# Patient Record
Sex: Male | Born: 1940 | Race: White | Hispanic: No | Smoking: Former smoker
Health system: Southern US, Community
[De-identification: ages and names within clinical notes are randomized; demographics above are authoritative.]

## PROBLEM LIST (undated history)

## (undated) DIAGNOSIS — Z9989 Dependence on other enabling machines and devices: Secondary | ICD-10-CM

## (undated) DIAGNOSIS — R5381 Other malaise: Secondary | ICD-10-CM

## (undated) DIAGNOSIS — J9610 Chronic respiratory failure, unspecified whether with hypoxia or hypercapnia: Secondary | ICD-10-CM

## (undated) DIAGNOSIS — F419 Anxiety disorder, unspecified: Secondary | ICD-10-CM

## (undated) DIAGNOSIS — H9190 Unspecified hearing loss, unspecified ear: Secondary | ICD-10-CM

## (undated) DIAGNOSIS — I429 Cardiomyopathy, unspecified: Secondary | ICD-10-CM

## (undated) DIAGNOSIS — R259 Unspecified abnormal involuntary movements: Secondary | ICD-10-CM

## (undated) DIAGNOSIS — K5520 Angiodysplasia of colon without hemorrhage: Secondary | ICD-10-CM

## (undated) DIAGNOSIS — E7800 Pure hypercholesterolemia, unspecified: Secondary | ICD-10-CM

## (undated) DIAGNOSIS — J8410 Pulmonary fibrosis, unspecified: Secondary | ICD-10-CM

## (undated) DIAGNOSIS — J449 Chronic obstructive pulmonary disease, unspecified: Secondary | ICD-10-CM

## (undated) DIAGNOSIS — F4310 Post-traumatic stress disorder, unspecified: Secondary | ICD-10-CM

## (undated) DIAGNOSIS — I2510 Atherosclerotic heart disease of native coronary artery without angina pectoris: Secondary | ICD-10-CM

## (undated) DIAGNOSIS — H15003 Unspecified scleritis, bilateral: Secondary | ICD-10-CM

## (undated) DIAGNOSIS — R5383 Other fatigue: Secondary | ICD-10-CM

## (undated) DIAGNOSIS — G4733 Obstructive sleep apnea (adult) (pediatric): Secondary | ICD-10-CM

## (undated) DIAGNOSIS — E039 Hypothyroidism, unspecified: Secondary | ICD-10-CM

## (undated) DIAGNOSIS — IMO0002 Reserved for concepts with insufficient information to code with codable children: Secondary | ICD-10-CM

## (undated) DIAGNOSIS — D696 Thrombocytopenia, unspecified: Secondary | ICD-10-CM

## (undated) DIAGNOSIS — N400 Enlarged prostate without lower urinary tract symptoms: Secondary | ICD-10-CM

## (undated) DIAGNOSIS — E119 Type 2 diabetes mellitus without complications: Secondary | ICD-10-CM

## (undated) DIAGNOSIS — Z9981 Dependence on supplemental oxygen: Secondary | ICD-10-CM

## (undated) DIAGNOSIS — F329 Major depressive disorder, single episode, unspecified: Secondary | ICD-10-CM

## (undated) DIAGNOSIS — I255 Ischemic cardiomyopathy: Secondary | ICD-10-CM

## (undated) DIAGNOSIS — R042 Hemoptysis: Secondary | ICD-10-CM

## (undated) DIAGNOSIS — R269 Unspecified abnormalities of gait and mobility: Secondary | ICD-10-CM

## (undated) DIAGNOSIS — I10 Essential (primary) hypertension: Secondary | ICD-10-CM

## (undated) DIAGNOSIS — J189 Pneumonia, unspecified organism: Secondary | ICD-10-CM

## (undated) DIAGNOSIS — I719 Aortic aneurysm of unspecified site, without rupture: Secondary | ICD-10-CM

## (undated) DIAGNOSIS — M069 Rheumatoid arthritis, unspecified: Secondary | ICD-10-CM

## (undated) DIAGNOSIS — M1990 Unspecified osteoarthritis, unspecified site: Secondary | ICD-10-CM

## (undated) DIAGNOSIS — I5022 Chronic systolic (congestive) heart failure: Secondary | ICD-10-CM

## (undated) HISTORY — DX: Unspecified scleritis, bilateral: H15.003

## (undated) HISTORY — PX: TONSILLECTOMY: SUR1361

## (undated) HISTORY — DX: Unspecified abnormalities of gait and mobility: R26.9

## (undated) HISTORY — DX: Chronic respiratory failure, unspecified whether with hypoxia or hypercapnia: J96.10

## (undated) HISTORY — PX: CATARACT EXTRACTION W/ INTRAOCULAR LENS  IMPLANT, BILATERAL: SHX1307

## (undated) HISTORY — DX: Essential (primary) hypertension: I10

## (undated) HISTORY — DX: Unspecified hearing loss, unspecified ear: H91.90

## (undated) HISTORY — DX: Major depressive disorder, single episode, unspecified: F32.9

## (undated) HISTORY — DX: Atherosclerotic heart disease of native coronary artery without angina pectoris: I25.10

## (undated) HISTORY — DX: Angiodysplasia of colon without hemorrhage: K55.20

## (undated) HISTORY — DX: Unspecified osteoarthritis, unspecified site: M19.90

## (undated) HISTORY — DX: Pulmonary fibrosis, unspecified: J84.10

## (undated) HISTORY — DX: Thrombocytopenia, unspecified: D69.6

## (undated) HISTORY — DX: Hypothyroidism, unspecified: E03.9

## (undated) HISTORY — DX: Other malaise: R53.81

## (undated) HISTORY — DX: Chronic obstructive pulmonary disease, unspecified: J44.9

## (undated) HISTORY — PX: GANGLION CYST EXCISION: SHX1691

## (undated) HISTORY — PX: CARDIAC CATHETERIZATION: SHX172

## (undated) HISTORY — DX: Other fatigue: R53.83

## (undated) HISTORY — PX: BACK SURGERY: SHX140

## (undated) HISTORY — DX: Cardiomyopathy, unspecified: I42.9

## (undated) HISTORY — DX: Post-traumatic stress disorder, unspecified: F43.10

## (undated) HISTORY — PX: COLECTOMY: SHX59

## (undated) HISTORY — DX: Unspecified abnormal involuntary movements: R25.9

## (undated) HISTORY — DX: Hemoptysis: R04.2

## (undated) HISTORY — PX: EYE SURGERY: SHX253

## (undated) HISTORY — DX: Rheumatoid arthritis, unspecified: M06.9

## (undated) HISTORY — DX: Reserved for concepts with insufficient information to code with codable children: IMO0002

## (undated) HISTORY — DX: Benign prostatic hyperplasia without lower urinary tract symptoms: N40.0

---

## 1970-07-28 HISTORY — PX: LUMBAR LAMINECTOMY: SHX95

## 2000-07-28 HISTORY — PX: FEMORAL EMBOLOECTOMY: SHX1584

## 2000-08-17 ENCOUNTER — Encounter: Payer: Self-pay | Admitting: Emergency Medicine

## 2000-08-17 ENCOUNTER — Inpatient Hospital Stay (HOSPITAL_COMMUNITY): Admission: EM | Admit: 2000-08-17 | Discharge: 2000-08-27 | Payer: Self-pay | Admitting: Emergency Medicine

## 2000-08-18 ENCOUNTER — Encounter: Payer: Self-pay | Admitting: Internal Medicine

## 2000-08-19 ENCOUNTER — Encounter: Payer: Self-pay | Admitting: Internal Medicine

## 2000-08-20 ENCOUNTER — Encounter: Payer: Self-pay | Admitting: Pulmonary Disease

## 2000-08-21 ENCOUNTER — Encounter: Payer: Self-pay | Admitting: Internal Medicine

## 2000-08-25 ENCOUNTER — Encounter: Payer: Self-pay | Admitting: Internal Medicine

## 2000-09-23 ENCOUNTER — Ambulatory Visit (HOSPITAL_COMMUNITY): Admission: RE | Admit: 2000-09-23 | Discharge: 2000-09-23 | Payer: Self-pay | Admitting: *Deleted

## 2000-09-25 ENCOUNTER — Ambulatory Visit (HOSPITAL_COMMUNITY): Admission: RE | Admit: 2000-09-25 | Discharge: 2000-09-25 | Payer: Self-pay | Admitting: *Deleted

## 2001-12-24 ENCOUNTER — Encounter: Payer: Self-pay | Admitting: Internal Medicine

## 2001-12-24 ENCOUNTER — Encounter: Admission: RE | Admit: 2001-12-24 | Discharge: 2001-12-24 | Payer: Self-pay | Admitting: Internal Medicine

## 2001-12-30 ENCOUNTER — Ambulatory Visit (HOSPITAL_COMMUNITY): Admission: RE | Admit: 2001-12-30 | Discharge: 2001-12-30 | Payer: Self-pay | Admitting: *Deleted

## 2002-06-21 ENCOUNTER — Encounter: Payer: Self-pay | Admitting: Internal Medicine

## 2002-06-21 ENCOUNTER — Encounter: Admission: RE | Admit: 2002-06-21 | Discharge: 2002-06-21 | Payer: Self-pay | Admitting: Internal Medicine

## 2002-07-11 ENCOUNTER — Encounter: Payer: Self-pay | Admitting: Vascular Surgery

## 2002-07-13 ENCOUNTER — Ambulatory Visit (HOSPITAL_COMMUNITY): Admission: RE | Admit: 2002-07-13 | Discharge: 2002-07-13 | Payer: Self-pay | Admitting: Vascular Surgery

## 2002-07-28 HISTORY — PX: ABDOMINAL AORTIC ANEURYSM REPAIR: SUR1152

## 2002-08-04 ENCOUNTER — Encounter: Payer: Self-pay | Admitting: Vascular Surgery

## 2002-08-04 ENCOUNTER — Inpatient Hospital Stay (HOSPITAL_COMMUNITY): Admission: RE | Admit: 2002-08-04 | Discharge: 2002-08-10 | Payer: Self-pay | Admitting: Vascular Surgery

## 2002-08-04 ENCOUNTER — Encounter (INDEPENDENT_AMBULATORY_CARE_PROVIDER_SITE_OTHER): Payer: Self-pay | Admitting: *Deleted

## 2002-08-05 ENCOUNTER — Encounter: Payer: Self-pay | Admitting: Vascular Surgery

## 2002-09-04 ENCOUNTER — Emergency Department (HOSPITAL_COMMUNITY): Admission: EM | Admit: 2002-09-04 | Discharge: 2002-09-04 | Payer: Self-pay | Admitting: Emergency Medicine

## 2004-01-06 ENCOUNTER — Emergency Department (HOSPITAL_COMMUNITY): Admission: EM | Admit: 2004-01-06 | Discharge: 2004-01-06 | Payer: Self-pay | Admitting: Emergency Medicine

## 2004-01-17 ENCOUNTER — Ambulatory Visit (HOSPITAL_COMMUNITY): Admission: RE | Admit: 2004-01-17 | Discharge: 2004-01-17 | Payer: Self-pay | Admitting: Internal Medicine

## 2004-02-26 HISTORY — PX: ABDOMINAL EXPLORATION SURGERY: SHX538

## 2004-03-18 ENCOUNTER — Encounter: Payer: Self-pay | Admitting: Emergency Medicine

## 2004-03-18 ENCOUNTER — Inpatient Hospital Stay (HOSPITAL_COMMUNITY): Admission: EM | Admit: 2004-03-18 | Discharge: 2004-03-29 | Payer: Self-pay | Admitting: Emergency Medicine

## 2007-11-24 ENCOUNTER — Encounter: Admission: RE | Admit: 2007-11-24 | Discharge: 2007-11-24 | Payer: Self-pay | Admitting: Internal Medicine

## 2007-12-06 ENCOUNTER — Encounter: Admission: RE | Admit: 2007-12-06 | Discharge: 2008-01-06 | Payer: Self-pay | Admitting: Internal Medicine

## 2009-02-10 ENCOUNTER — Observation Stay (HOSPITAL_COMMUNITY): Admission: EM | Admit: 2009-02-10 | Discharge: 2009-02-12 | Payer: Self-pay | Admitting: Emergency Medicine

## 2009-05-31 ENCOUNTER — Encounter: Admission: RE | Admit: 2009-05-31 | Discharge: 2009-05-31 | Payer: Self-pay | Admitting: Internal Medicine

## 2009-07-24 ENCOUNTER — Ambulatory Visit: Payer: Self-pay | Admitting: Internal Medicine

## 2009-07-24 ENCOUNTER — Inpatient Hospital Stay (HOSPITAL_COMMUNITY): Admission: EM | Admit: 2009-07-24 | Discharge: 2009-07-28 | Payer: Self-pay | Admitting: Emergency Medicine

## 2009-07-25 ENCOUNTER — Encounter: Payer: Self-pay | Admitting: Internal Medicine

## 2009-07-30 DIAGNOSIS — I10 Essential (primary) hypertension: Secondary | ICD-10-CM | POA: Insufficient documentation

## 2009-07-30 DIAGNOSIS — E039 Hypothyroidism, unspecified: Secondary | ICD-10-CM | POA: Insufficient documentation

## 2009-07-30 DIAGNOSIS — E7800 Pure hypercholesterolemia, unspecified: Secondary | ICD-10-CM | POA: Insufficient documentation

## 2009-07-30 HISTORY — DX: Hypothyroidism, unspecified: E03.9

## 2009-07-30 HISTORY — DX: Essential (primary) hypertension: I10

## 2009-07-31 ENCOUNTER — Ambulatory Visit: Payer: Self-pay | Admitting: Internal Medicine

## 2009-07-31 DIAGNOSIS — J9610 Chronic respiratory failure, unspecified whether with hypoxia or hypercapnia: Secondary | ICD-10-CM

## 2009-07-31 HISTORY — DX: Chronic respiratory failure, unspecified whether with hypoxia or hypercapnia: J96.10

## 2009-08-15 ENCOUNTER — Ambulatory Visit: Payer: Self-pay | Admitting: Internal Medicine

## 2009-09-05 ENCOUNTER — Telehealth (INDEPENDENT_AMBULATORY_CARE_PROVIDER_SITE_OTHER): Payer: Self-pay | Admitting: *Deleted

## 2009-09-11 ENCOUNTER — Telehealth: Payer: Self-pay | Admitting: Internal Medicine

## 2009-09-14 ENCOUNTER — Encounter: Payer: Self-pay | Admitting: Internal Medicine

## 2009-09-26 ENCOUNTER — Ambulatory Visit: Payer: Self-pay | Admitting: Internal Medicine

## 2009-09-28 ENCOUNTER — Telehealth: Payer: Self-pay | Admitting: Internal Medicine

## 2009-11-06 ENCOUNTER — Ambulatory Visit: Payer: Self-pay | Admitting: Internal Medicine

## 2009-11-06 DIAGNOSIS — B370 Candidal stomatitis: Secondary | ICD-10-CM

## 2009-11-23 ENCOUNTER — Ambulatory Visit: Payer: Self-pay | Admitting: Internal Medicine

## 2009-11-23 DIAGNOSIS — F329 Major depressive disorder, single episode, unspecified: Secondary | ICD-10-CM

## 2009-11-23 DIAGNOSIS — I2510 Atherosclerotic heart disease of native coronary artery without angina pectoris: Secondary | ICD-10-CM

## 2009-11-23 DIAGNOSIS — M1990 Unspecified osteoarthritis, unspecified site: Secondary | ICD-10-CM

## 2009-11-23 DIAGNOSIS — R5381 Other malaise: Secondary | ICD-10-CM

## 2009-11-23 DIAGNOSIS — F3289 Other specified depressive episodes: Secondary | ICD-10-CM

## 2009-11-23 DIAGNOSIS — N400 Enlarged prostate without lower urinary tract symptoms: Secondary | ICD-10-CM

## 2009-11-23 DIAGNOSIS — R259 Unspecified abnormal involuntary movements: Secondary | ICD-10-CM

## 2009-11-23 HISTORY — DX: Unspecified abnormal involuntary movements: R25.9

## 2009-11-23 HISTORY — DX: Other malaise: R53.81

## 2009-11-23 HISTORY — DX: Atherosclerotic heart disease of native coronary artery without angina pectoris: I25.10

## 2009-11-23 HISTORY — DX: Unspecified osteoarthritis, unspecified site: M19.90

## 2009-11-23 HISTORY — DX: Other specified depressive episodes: F32.89

## 2009-11-23 HISTORY — DX: Benign prostatic hyperplasia without lower urinary tract symptoms: N40.0

## 2009-11-23 HISTORY — DX: Major depressive disorder, single episode, unspecified: F32.9

## 2009-11-26 LAB — CONVERTED CEMR LAB: Vit D, 25-Hydroxy: 29 ng/mL — ABNORMAL LOW (ref 30–89)

## 2009-11-27 LAB — CONVERTED CEMR LAB
ALT: 27 units/L (ref 0–53)
AST: 31 units/L (ref 0–37)
Albumin: 4.2 g/dL (ref 3.5–5.2)
Alkaline Phosphatase: 52 units/L (ref 39–117)
BUN: 18 mg/dL (ref 6–23)
Basophils Absolute: 0 10*3/uL (ref 0.0–0.1)
Basophils Relative: 0.4 % (ref 0.0–3.0)
Bilirubin Urine: NEGATIVE
Bilirubin, Direct: 0.1 mg/dL (ref 0.0–0.3)
CO2: 34 meq/L — ABNORMAL HIGH (ref 19–32)
Calcium: 9.1 mg/dL (ref 8.4–10.5)
Chloride: 99 meq/L (ref 96–112)
Cholesterol: 172 mg/dL (ref 0–200)
Creatinine, Ser: 1 mg/dL (ref 0.4–1.5)
Eosinophils Absolute: 0.3 10*3/uL (ref 0.0–0.7)
Eosinophils Relative: 2.7 % (ref 0.0–5.0)
Folate: 13.1 ng/mL
GFR calc non Af Amer: 78.92 mL/min (ref >60)
Glucose, Bld: 122 mg/dL — ABNORMAL HIGH (ref 70–99)
HCT: 43.7 % (ref 39.0–52.0)
HDL: 34.2 mg/dL — ABNORMAL LOW (ref >39.00)
Hemoglobin, Urine: NEGATIVE
Hemoglobin: 14.3 g/dL (ref 13.0–17.0)
Hgb A1c MFr Bld: 8.4 % — ABNORMAL HIGH (ref 4.6–6.5)
Iron: 29 ug/dL — ABNORMAL LOW (ref 42–165)
Ketones, ur: NEGATIVE mg/dL
LDL Cholesterol: 107 mg/dL — ABNORMAL HIGH (ref 0–99)
Leukocytes, UA: NEGATIVE
Lymphocytes Relative: 24.2 % (ref 12.0–46.0)
Lymphs Abs: 2.3 10*3/uL (ref 0.7–4.0)
MCHC: 32.6 g/dL (ref 30.0–36.0)
MCV: 82.6 fL (ref 78.0–100.0)
Monocytes Absolute: 0.9 10*3/uL (ref 0.1–1.0)
Monocytes Relative: 9.9 % (ref 3.0–12.0)
Neutro Abs: 6 10*3/uL (ref 1.4–7.7)
Neutrophils Relative %: 62.8 % (ref 43.0–77.0)
Nitrite: NEGATIVE
PSA: 0.67 ng/mL (ref 0.10–4.00)
Platelets: 223 10*3/uL (ref 150.0–400.0)
Potassium: 3.9 meq/L (ref 3.5–5.1)
RBC: 5.29 M/uL (ref 4.22–5.81)
RDW: 15.1 % — ABNORMAL HIGH (ref 11.5–14.6)
Saturation Ratios: 5.2 % — ABNORMAL LOW (ref 20.0–50.0)
Sed Rate: 10 mm/hr (ref 0–22)
Sodium: 141 meq/L (ref 135–145)
Specific Gravity, Urine: 1.015 (ref 1.000–1.030)
TSH: 1.63 microintl units/mL (ref 0.35–5.50)
Total Bilirubin: 0.7 mg/dL (ref 0.3–1.2)
Total CHOL/HDL Ratio: 5
Total Protein: 7 g/dL (ref 6.0–8.3)
Transferrin: 400.2 mg/dL — ABNORMAL HIGH (ref 212.0–360.0)
Triglycerides: 154 mg/dL — ABNORMAL HIGH (ref 0.0–149.0)
Urine Glucose: NEGATIVE mg/dL
Urobilinogen, UA: 1 (ref 0.0–1.0)
VLDL: 30.8 mg/dL (ref 0.0–40.0)
Vitamin B-12: 333 pg/mL (ref 211–911)
WBC: 9.5 10*3/uL (ref 4.5–10.5)
pH: 7.5 (ref 5.0–8.0)

## 2009-12-08 ENCOUNTER — Ambulatory Visit: Payer: Self-pay | Admitting: Internal Medicine

## 2009-12-10 ENCOUNTER — Ambulatory Visit: Payer: Self-pay | Admitting: Internal Medicine

## 2009-12-10 DIAGNOSIS — R269 Unspecified abnormalities of gait and mobility: Secondary | ICD-10-CM | POA: Insufficient documentation

## 2009-12-10 HISTORY — DX: Unspecified abnormalities of gait and mobility: R26.9

## 2009-12-11 ENCOUNTER — Ambulatory Visit: Payer: Self-pay | Admitting: Internal Medicine

## 2009-12-17 ENCOUNTER — Emergency Department (HOSPITAL_COMMUNITY): Admission: EM | Admit: 2009-12-17 | Discharge: 2009-12-17 | Payer: Self-pay | Admitting: Emergency Medicine

## 2009-12-17 ENCOUNTER — Telehealth: Payer: Self-pay | Admitting: Internal Medicine

## 2010-01-23 ENCOUNTER — Encounter: Payer: Self-pay | Admitting: Internal Medicine

## 2010-01-30 ENCOUNTER — Encounter: Admission: RE | Admit: 2010-01-30 | Discharge: 2010-01-30 | Payer: Self-pay | Admitting: Neurology

## 2010-02-07 ENCOUNTER — Ambulatory Visit: Payer: Self-pay | Admitting: Internal Medicine

## 2010-02-21 ENCOUNTER — Ambulatory Visit: Payer: Self-pay | Admitting: Internal Medicine

## 2010-03-01 ENCOUNTER — Telehealth: Payer: Self-pay | Admitting: Internal Medicine

## 2010-03-01 ENCOUNTER — Ambulatory Visit: Payer: Self-pay | Admitting: Internal Medicine

## 2010-03-01 DIAGNOSIS — H9190 Unspecified hearing loss, unspecified ear: Secondary | ICD-10-CM

## 2010-03-01 HISTORY — DX: Unspecified hearing loss, unspecified ear: H91.90

## 2010-03-18 ENCOUNTER — Telehealth (INDEPENDENT_AMBULATORY_CARE_PROVIDER_SITE_OTHER): Payer: Self-pay | Admitting: *Deleted

## 2010-03-20 ENCOUNTER — Encounter: Payer: Self-pay | Admitting: Internal Medicine

## 2010-03-26 ENCOUNTER — Ambulatory Visit: Payer: Self-pay | Admitting: Internal Medicine

## 2010-04-03 ENCOUNTER — Telehealth (INDEPENDENT_AMBULATORY_CARE_PROVIDER_SITE_OTHER): Payer: Self-pay | Admitting: *Deleted

## 2010-04-04 ENCOUNTER — Telehealth (INDEPENDENT_AMBULATORY_CARE_PROVIDER_SITE_OTHER): Payer: Self-pay | Admitting: *Deleted

## 2010-04-08 ENCOUNTER — Encounter: Payer: Self-pay | Admitting: Internal Medicine

## 2010-05-07 ENCOUNTER — Ambulatory Visit: Payer: Self-pay | Admitting: Internal Medicine

## 2010-05-07 ENCOUNTER — Encounter: Payer: Self-pay | Admitting: Adult Health

## 2010-05-07 DIAGNOSIS — R042 Hemoptysis: Secondary | ICD-10-CM

## 2010-05-07 HISTORY — DX: Hemoptysis: R04.2

## 2010-05-07 LAB — CONVERTED CEMR LAB
BUN: 19 mg/dL (ref 6–23)
Basophils Absolute: 0 10*3/uL (ref 0.0–0.1)
Basophils Relative: 0.4 % (ref 0.0–3.0)
CO2: 33 meq/L — ABNORMAL HIGH (ref 19–32)
Calcium: 10.1 mg/dL (ref 8.4–10.5)
Chloride: 98 meq/L (ref 96–112)
Creatinine, Ser: 1.3 mg/dL (ref 0.4–1.5)
Eosinophils Absolute: 0.2 10*3/uL (ref 0.0–0.7)
Eosinophils Relative: 1.8 % (ref 0.0–5.0)
GFR calc non Af Amer: 59.82 mL/min (ref >60)
Glucose, Bld: 134 mg/dL — ABNORMAL HIGH (ref 70–99)
HCT: 42.5 % (ref 39.0–52.0)
Hemoglobin: 13.7 g/dL (ref 13.0–17.0)
Lymphocytes Relative: 20.4 % (ref 12.0–46.0)
Lymphs Abs: 2.1 10*3/uL (ref 0.7–4.0)
MCHC: 32.2 g/dL (ref 30.0–36.0)
MCV: 80.4 fL (ref 78.0–100.0)
Monocytes Absolute: 1.1 10*3/uL — ABNORMAL HIGH (ref 0.1–1.0)
Monocytes Relative: 10.5 % (ref 3.0–12.0)
Neutro Abs: 6.8 10*3/uL (ref 1.4–7.7)
Neutrophils Relative %: 66.9 % (ref 43.0–77.0)
Platelets: 182 10*3/uL (ref 150.0–400.0)
Potassium: 5.1 meq/L (ref 3.5–5.1)
RBC: 5.28 M/uL (ref 4.22–5.81)
RDW: 17.2 % — ABNORMAL HIGH (ref 11.5–14.6)
Sodium: 142 meq/L (ref 135–145)
WBC: 10.2 10*3/uL (ref 4.5–10.5)

## 2010-05-08 ENCOUNTER — Ambulatory Visit: Payer: Self-pay | Admitting: Cardiology

## 2010-05-13 ENCOUNTER — Ambulatory Visit: Payer: Self-pay | Admitting: Internal Medicine

## 2010-05-14 ENCOUNTER — Telehealth (INDEPENDENT_AMBULATORY_CARE_PROVIDER_SITE_OTHER): Payer: Self-pay | Admitting: *Deleted

## 2010-05-15 ENCOUNTER — Telehealth: Payer: Self-pay | Admitting: Adult Health

## 2010-06-04 ENCOUNTER — Encounter: Payer: Self-pay | Admitting: Adult Health

## 2010-06-04 ENCOUNTER — Ambulatory Visit: Payer: Self-pay | Admitting: Internal Medicine

## 2010-06-05 ENCOUNTER — Ambulatory Visit: Payer: Self-pay | Admitting: Internal Medicine

## 2010-06-05 DIAGNOSIS — IMO0002 Reserved for concepts with insufficient information to code with codable children: Secondary | ICD-10-CM

## 2010-06-05 HISTORY — DX: Reserved for concepts with insufficient information to code with codable children: IMO0002

## 2010-06-11 ENCOUNTER — Ambulatory Visit (HOSPITAL_COMMUNITY): Admission: RE | Admit: 2010-06-11 | Discharge: 2010-06-11 | Payer: Self-pay | Admitting: Internal Medicine

## 2010-06-18 ENCOUNTER — Ambulatory Visit: Payer: Self-pay | Admitting: Internal Medicine

## 2010-06-18 DIAGNOSIS — J8410 Pulmonary fibrosis, unspecified: Secondary | ICD-10-CM | POA: Insufficient documentation

## 2010-06-18 HISTORY — DX: Pulmonary fibrosis, unspecified: J84.10

## 2010-06-18 LAB — CONVERTED CEMR LAB
Anti Nuclear Antibody(ANA): NEGATIVE
Rhuematoid fact SerPl-aCnc: 352 intl units/mL — ABNORMAL HIGH (ref 0–20)
Sed Rate: 10 mm/hr (ref 0–22)

## 2010-06-19 ENCOUNTER — Telehealth: Payer: Self-pay | Admitting: Internal Medicine

## 2010-07-02 ENCOUNTER — Telehealth: Payer: Self-pay | Admitting: Internal Medicine

## 2010-07-03 ENCOUNTER — Encounter: Payer: Self-pay | Admitting: Internal Medicine

## 2010-07-03 ENCOUNTER — Ambulatory Visit: Payer: Self-pay | Admitting: Internal Medicine

## 2010-07-03 LAB — CONVERTED CEMR LAB: CRP, High Sensitivity: 10.85 — ABNORMAL HIGH (ref 0.00–5.00)

## 2010-07-12 ENCOUNTER — Telehealth (INDEPENDENT_AMBULATORY_CARE_PROVIDER_SITE_OTHER): Payer: Self-pay | Admitting: *Deleted

## 2010-07-12 LAB — CONVERTED CEMR LAB
Cyclic Citrullin Peptide Ab: <2 units (ref 0.0–5.0)
Scleroderma (Scl-70) (ENA) Antibody, IgG: <1 (ref <30)

## 2010-07-15 ENCOUNTER — Encounter (HOSPITAL_COMMUNITY)
Admission: RE | Admit: 2010-07-15 | Discharge: 2010-08-27 | Payer: Self-pay | Source: Home / Self Care | Attending: Internal Medicine | Admitting: Internal Medicine

## 2010-07-15 ENCOUNTER — Inpatient Hospital Stay (HOSPITAL_COMMUNITY)
Admission: EM | Admit: 2010-07-15 | Discharge: 2010-07-18 | Payer: Self-pay | Source: Home / Self Care | Attending: Internal Medicine | Admitting: Internal Medicine

## 2010-08-12 ENCOUNTER — Ambulatory Visit
Admission: RE | Admit: 2010-08-12 | Discharge: 2010-08-12 | Payer: Self-pay | Source: Home / Self Care | Attending: Internal Medicine | Admitting: Internal Medicine

## 2010-08-12 ENCOUNTER — Encounter: Payer: Self-pay | Admitting: Internal Medicine

## 2010-08-21 ENCOUNTER — Encounter: Payer: Self-pay | Admitting: Internal Medicine

## 2010-08-27 ENCOUNTER — Ambulatory Visit
Admission: RE | Admit: 2010-08-27 | Discharge: 2010-08-27 | Payer: Self-pay | Source: Home / Self Care | Attending: Internal Medicine | Admitting: Internal Medicine

## 2010-08-27 ENCOUNTER — Telehealth (INDEPENDENT_AMBULATORY_CARE_PROVIDER_SITE_OTHER): Payer: Self-pay | Admitting: *Deleted

## 2010-08-29 ENCOUNTER — Encounter (HOSPITAL_COMMUNITY): Payer: Medicare Other

## 2010-08-29 NOTE — Letter (Signed)
Summary: LMN for Oxygen/[facility]  LMN for Oxygen/[facility]   Imported By: Sherian Rein 04/11/2010 08:07:54  _____________________________________________________________________  External Attachment:    Type:   Image     Comment:   External Document

## 2010-08-29 NOTE — Consult Note (Signed)
Summary: COPD exacerbation/[facility]  COPD exacerbation/[facility]   Imported By: Sherian Rein 08/07/2009 09:39:10  _____________________________________________________________________  External Attachment:    Type:   Image     Comment:   External Document

## 2010-08-29 NOTE — Progress Notes (Signed)
Summary: returning call  Phone Note Call from Patient Call back at Home Phone [phone]   Caller: Patient Call For: [email or web address] Summary of Call: Returning Jennifer's call. Initial call taken by: Darletta Moll,  July 12, 2010 9:00 AM  Follow-up for Phone Call        [id number] to advise pt of labs per append to 07-03-10 labs. Carron Curie CMA  July 12, 2010 9:35 AM  Pt returned call.  He was informed of lab results and recs per append  on 07/09/10 by MR.  He verbalized understanding of this.  He is aware MR would like to see him back in Feb for f/u and PFTs but also needs recertification on oxygen. He is requesting all this be done same day and before Feb.  PFT scheduled for 08/12/10 at 1pm and f/u afterwards with MR at 2:10.  Pt aware and will bring card from DME stating o2 recert needed.   Follow-up by: Gweneth Dimitri RN,  July 12, 2010 12:32 PM

## 2010-08-29 NOTE — Progress Notes (Signed)
Summary: O2 sats report - refaxed to [facility]  Phone Note Call from Patient Call back at Home Phone [phone]   Caller: Patient Call For: WERT Summary of Call: pt states that [facility] has not yet received a report re: recent O2 sats. pt also has a question re: using eclipse at night.  Initial call taken by: [name], CNA,  April 03, 2010 11:22 AM  Follow-up for Phone Call        ATC x1 line busy, will retry later to address pts other questions. I have refaxed qualifying sats to [facility]. Carron Curie CMA  April 03, 2010 2:38 PM  called spoke with patient, informed him that the re-qualifying sats have been refaxed to [facility].  pt verbalized his understanding.  pt asked if the eclipse portable o2 concentrator can be used for trips and overnight use - will be traveling to [place] in the near future.  i informed patient that we are not familiar with all of the different uses for each particular machine and that he will have to call [facility] for that answer.  pt verbalized his understanding. Follow-up by: [name] CNA/MA,  April 03, 2010 3:50 PM

## 2010-08-29 NOTE — Miscellaneous (Signed)
Summary: Orders Update pft charges  Clinical Lists Changes  Orders: Added new Service order of Carbon Monoxide diffusing w/capacity (94720) - Signed Added new Service order of Lung Volumes (94240) - Signed Added new Service order of Spirometry (Pre & Post) (94060) - Signed 

## 2010-08-29 NOTE — Assessment & Plan Note (Signed)
Summary: Pulmonary/ f/u ov   Primary Provider/Referring Provider:  Dr. Eula Listen  CC:  3 month followup.  Pt states that his breathing is "pretty good".  .  History of Present Illness: 32  yowm quit smoking around 2000 with GOLD III COPD by PFT's 09/2009  July 31, 2009 cc sob at rest since June 2010 while of ace and high doses of propranonol seen in consutation 07/25/2009 rec no ace, d/c propranolo > reports  no better from hospitalization and on  02 just at night with dry cough but no cp.  Thoroughly confused with instructions, still on ACE though I had strongly rec he stop these.  August 15, 2009--Returns for follow up and med review. Last visit, Ace was stopped (again). He returns today feeling much better. Only using O2 intermittently, "afraid he will get dependent on this", We discussed how O2 works and problems with desaturations. He desats in the upper 70 with activity and at rest he averages 87-90% at rest. He is on mulitple meds. Gets most of meds via [place] system. He does have insurance as well.  September 26, 2009 Followup with PFT's.  Pt states breathing is the same which represents a marked improvement over baseline with no new complaints today. on 4lpm can walk flat fine as long as he paces himself with no sign variability. rec symbicort     February 07, 2010 cc  breathing is "pretty good".   can even push a mower now for 30 min wihout sotpping using 02 4lpm with anything but very slow adls vs a year ago sob at rest.  sleeps ok without resp problems or am exac. rarely needs saba (maybe twice a week) no cough. Pt denies any significant sore throat, dysphagia, itching, sneezing,  nasal congestion or excess secretions,  fever, chills, sweats, unintended wt loss, pleuritic or exertional cp, hempoptysis, change in activity tolerance  orthopnea pnd or leg swelling   Current Medications (verified): 1)  Spiriva Handihaler 18 Mcg Caps (Tiotropium Bromide Monohydrate) .... Inhale Contents of 1 Cap  Daily 2)  Centrum Silver  Tabs (Multiple Vitamins-Minerals) .[name].. 1 Once Daily 3)  Gemfibrozil 600 Mg Tabs (Gemfibrozil) .[name].. 1 Tab By Mouth  Every Morning and At Bedtime 4)  Crestor 20 Mg Tabs (Rosuvastatin Calcium) .... 1/2 At Bedtime 5)  Levothyroxine Sodium 100 Mcg Tabs (Levothyroxine Sodium) .... One Daily 6)  Levothyroxine Sodium 112 Mcg Tabs (Levothyroxine Sodium) .[name].. 1 By Mouth Daily 7)  Gabapentin 800 Mg Tabs (Gabapentin) .[name].. 1 Three Times A Day 8)  Losartan Potassium 100 Mg Tabs (Losartan Potassium) .... 1/2 Once Daily 9)  Requip 1 Mg Tabs (Ropinirole Hcl) .... Take 1 Tab By Mouth At Bedtime 10)  Fluoxetine Hcl 20 Mg Tabs (Fluoxetine Hcl) .[name].. 1 Once Daily 11)  Finasteride 5 Mg Tabs (Finasteride) .... Take 1 Tab By Mouth At Bedtime 12)  Flomax 0.4 Mg Caps (Tamsulosin Hcl) .... Take 1 Tab By Mouth At Bedtime 13)  Glipizide 10 Mg Tabs (Glipizide) .[name].. 1 By Mouth Two Times A Day 14)  Metformin Hcl 1000 Mg Tabs (Metformin Hcl) .... One Twice Daily 15)  Aspirin 81 Mg Tbec (Aspirin) .[name].. 1 By Mouth Daily 16)  Lantus 100 Unit/ml Soln (Insulin Glargine) .... 75 Units Subcutaneously Once Daily 17)  Proventil Hfa 108 (90 Base) Mcg/act Aers (Albuterol Sulfate) .... 2 Puffs Every 4 Hours As Needed 18)  Mucinex Dm 30-600 Mg Xr12h-Tab (Dextromethorphan-Guaifenesin) .... Take 1-2 Tablets Every 12 Hours As Needed 19)  Symbicort 160-4.5 Mcg/act  Aero (Budesonide-Formoterol Fumarate) .... 2 Puffs First Thing  in Am and 2 Puffs Again in Pm About 12 Hours Later 20)  Clotrimazole 10 Mg Troc (Clotrimazole) .... One 4 X Daily As Needed For Throat Irritation 21)  Home O2  - 2l At Rest, 4l With Exertion 22)  Hydrochlorothiazide 25 Mg Tabs (Hydrochlorothiazide) .... 1/2 Once Daily 23)  Vitamin D3 2000 Unit Caps (Cholecalciferol) .[name].. 1 Once Daily 24)  Zolpidem Tartrate 10 Mg Tabs (Zolpidem Tartrate) .[name].. 1 At Bedtime As Needed  Allergies (verified): No Known Drug Allergies  Past History:  Past Medical  History: HYPOTHYROIDISM (ICD-244.9)    - TSH  .276 12/209/10 HYPERTENSION (ICD-401.9) C O P D (ICD-496)...........................................................[name] Dr Sherene Sires      - HFA 50% July 31, 2009 >   90% September 26, 2009 > 90% February 07, 2010       - PFT's September 26, 2009 FEV1 1.27 (38%) ratio 36 with 30% improvement  after B2, DLC0 35 corrects to 66 Right Pleural Calcified plaques s/p remote pna     - CT Chest 05/31/2010 Diabetes mellitus, type II Benign prostatic hypertrophy DJD Depression Coronary artery disease - non-obstructive by cath juen 2003 - Dr Myriam Jacobson [place]/card tremor  Vital Signs:  Patient profile:   70 year old male Weight:      249.38 pounds O2 Sat:      94 % on 2 L/min Temp:     97.5 degrees F oral Pulse rate:   85 / minute BP sitting:   120 / 78  (left arm)  Vitals Entered By: Vernie Murders (February 07, 2010 9:33 AM)  O2 Flow:  2 L/min  Physical Exam  Additional Exam:  obese amb minimally  hoarse wm nad on 02 with minimal lip tremor wt  254  September 26, 2009 > 257 November 06, 2009 > 249 February 07, 2010  HEENT mild turbinate edema.  partial top denture, full bottom dentureOropharynx pos thrush or excess pnd or cobblestoning.  No JVD or cervical adenopathy. Mild accessory muscle hypertrophy. Trachea midline, nl thryroid. Chest was hyperinflated by percussion with diminished breath sounds and moderate increased exp time without wheeze. Hoover sign positive at mid inspiration. Regular rate and rhythm without murmur gallop or rub or increase P2 or edema.  Abd: no hsm, nl excursion. Ext warm without cyanosis or clubbing.     Impression & Recommendations:  Problem # 1:  C O P D (ICD-496) GOLD III with sign asthmatic component well compensated off ace and propranonol.   Each maintenance medication was reviewed in detail including most importantly the difference between maintenance and as needed and under what circumstances the prns are to be used. This was done in the  context of a medication calendar review which provided the patient with a user-friendly unambiguous mechanism for medication administration and reconciliation and provides an action plan for all active problems. It is critical that this be shown to every doctor  for modification during the office visit if necessary so the patient can use it as a working document.   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 Patient Level III (16109)  Medications Added to Medication List This Visit: 1)  Losartan Potassium 100 Mg Tabs (Losartan potassium) .... 1/2 once daily 2)  Fluoxetine Hcl 20  Mg Tabs (Fluoxetine hcl) .[name].. 1 once daily 3)  Hydrochlorothiazide 25 Mg Tabs (Hydrochlorothiazide) .... 1/2 once daily 4)  Vitamin D3 2000 Unit Caps (Cholecalciferol) .[name].. 1 once daily 5)  Zolpidem Tartrate 10 Mg Tabs (Zolpidem tartrate) .[name].. 1 at bedtime as needed  Patient Instructions: 1)  Return to office in 3 months, sooner if needed  2)  See calendar for specific medication instructions and bring it back for each and every office visit for every healthcare provider you see.  Without it,  you may not receive the best quality medical care that we feel you deserve

## 2010-08-29 NOTE — Assessment & Plan Note (Signed)
Summary: 3 MO ROV /NWS  # r leg pain/cd   Vital Signs:  Patient profile:   70 year old male Height:      74.5 inches Weight:      248.50 pounds BMI:     31.59 O2 Sat:      94 % on Room air Temp:     98 degrees F oral Pulse rate:   86 / minute BP sitting:   124 / 62  (left arm) Cuff size:   large  Vitals Entered By: Zella Ball Ewing CMA (AAMA) (June 05, 2010 1:46 PM)  O2 Flow:  Room air  CC: 3 month ROV, back pain/RE   Primary Care Dwanda Tufano:  Dr. Eula Listen  CC:  3 month ROV and back pain/RE.  History of Present Illness: here to f/u; has seen psychiatry for depression at the [place];  as well as endo with several meds stopped including the glipizide and prozac.  c/o pain to the right lower back and buttock,  had a fall 4 wks ago where the buttock started and persistetnly pained, but now with 3 days new onset radiation of pain to just below the right knee, with numbness and weakness, walking with cane today for balance and support (does not normally do this);  no bowel or bladder change;  no falls or gait change, fever or wt loss  [facility]; given 6 pills of vicodin 5/325 mg and advised to come here as able; pain medicine did not work - only lasted one hr, up and down at night  pt seen for med review yesterday, but  med list not updated yesterday to reflect his actual current meds, some from the [place], some discontinued as above  Pt denies CP, wheezing, orthopnea, pnd, worsening LE edema, palps, dizziness or syncope  Pt denies polydipsia, polyuria, or low sugar symptoms such as shakiness improved with eating.  Overall good compliance with meds, trying to follow low chol, DM diet, wt stable, little excercise however  No fever, wt loss, night sweats, loss of appetite or other constitutional symptoms   Problems Prior to Update: 1)  Lumbar Radiculopathy, Right  (ICD-724.4) 2)  Hemoptysis Unspecified  (ICD-786.30) 3)  Decreased Hearing, Left Ear  (ICD-389.9) 4)   Chronic Obstructive Pulmonary Disease, Acute Exacerbation  (ICD-491.21) 5)  Gait Disturbance  (ICD-781.2) 6)  Dyspnea/shortness of Breath  (ICD-786.09) 7)  Preventive Health Care  (ICD-V70.0) 8)  Tremor  (ICD-781.0) 9)  Special Screening Malig Neoplasms Other Sites  (ICD-V76.49) 10)  Fatigue  (ICD-780.79) 11)  Coronary Artery Disease  (ICD-414.00) 12)  Depression  (ICD-311) 13)  Degenerative Joint Disease  (ICD-715.90) 14)  Benign Prostatic Hypertrophy  (ICD-600.00) 15)  Diabetes Mellitus, Type II  (ICD-250.00) 16)  Thrush  (ICD-112.0) 17)  Respiratory Failure, Chronic  (ICD-518.83) 18)  Hypothyroidism  (ICD-244.9) 19)  Hypertension  (ICD-401.9) 20)  C O P D  (ICD-496)  Medications Prior to Update: 1)  Spiriva Handihaler 18 Mcg Caps (Tiotropium Bromide Monohydrate) .... Inhale Contents of 1 Cap Daily 2)  Centrum Silver  Tabs (Multiple Vitamins-Minerals) .[name].. 1 Once Daily 3)  Crestor 20 Mg Tabs (Rosuvastatin Calcium) .... 1/2 At Bedtime 4)  Gabapentin 800 Mg Tabs (Gabapentin) .[name].. 1 Three Times A Day 5)  Losartan Potassium 100 Mg Tabs (Losartan Potassium) .... 1/2 Once Daily 6)  Requip 1 Mg Tabs (Ropinirole Hcl) .... Take 1 Tab By Mouth At Bedtime 7)  Fluoxetine Hcl 20 Mg Tabs (Fluoxetine Hcl) .Marland KitchenMarland KitchenMarland Kitchen  1 Tablet Two Times A Day 8)  Flomax 0.4 Mg Caps (Tamsulosin Hcl) .... Take 1 Tab By Mouth At Bedtime 9)  Glipizide 10 Mg Tabs (Glipizide) .[name].. 1 By Mouth Two Times A Day 10)  Metformin Hcl 1000 Mg Tabs (Metformin Hcl) .... One Twice Daily 11)  Lantus 100 Unit/ml Soln (Insulin Glargine) .... 74 Units Subcutaneously Once Daily 12)  Proventil Hfa 108 (90 Base) Mcg/act Aers (Albuterol Sulfate) .... 2 Puffs Every 4 Hours As Needed 13)  Mucinex Dm 30-600 Mg Xr12h-Tab (Dextromethorphan-Guaifenesin) .... Take 1-2 Tablets Every 12 Hours As Needed 14)  Symbicort 160-4.5 Mcg/act  Aero (Budesonide-Formoterol Fumarate) .... 2 Puffs First Thing  in Am and 2 Puffs Again in Pm About 12 Hours Later 15)   Clotrimazole 10 Mg Troc (Clotrimazole) .... One 4 X Daily As Needed For Throat Irritation 16)  O2  - 2l At Rest, 4l With Exertion 17)  Hydrochlorothiazide 25 Mg Tabs (Hydrochlorothiazide) .... 1/2 Once Daily As Needed 18)  Vitamin D3 2000 Unit Caps (Cholecalciferol) .[name].. 1 Once Daily 19)  Zolpidem Tartrate 10 Mg Tabs (Zolpidem Tartrate) .[name].. 1 At Bedtime As Needed 20)  Niacin Cr 500 Mg Cr-Caps (Niacin) .[name].. 1 Tablet Once Daily 21)  Levothyroxine Sodium 200 Mcg Tabs (Levothyroxine Sodium) .[name].. 1 By Mouth Once Daily  Current Medications (verified): 1)  Spiriva Handihaler 18 Mcg Caps (Tiotropium Bromide Monohydrate) .... Inhale Contents of 1 Cap Daily 2)  Centrum Silver  Tabs (Multiple Vitamins-Minerals) .[name].. 1 Once Daily 3)  Simvastatin 20 Mg Tabs (Simvastatin) .... 1/4 By Mouth Once Daily 4)  Gabapentin 800 Mg Tabs (Gabapentin) .[name].. 1po Two Times A Day 5)  Losartan Potassium 100 Mg Tabs (Losartan Potassium) .... 1/2 Once Daily 6)  Requip 1 Mg Tabs (Ropinirole Hcl) .... Take 1 Tab By Mouth At Bedtime 7)  Flomax 0.4 Mg Caps (Tamsulosin Hcl) .... Take 1 Tab By Mouth At Bedtime 8)  Metformin Hcl 1000 Mg Tabs (Metformin Hcl) .... One Twice Daily 9)  Lantus 100 Unit/ml Soln (Insulin Glargine) .... 48 Units or More Subcutaneously Once Daily Plus Sliding Scale 10)  Proventil Hfa 108 (90 Base) Mcg/act Aers (Albuterol Sulfate) .... 2 Puffs Every 4 Hours As Needed 11)  Mucinex Dm 30-600 Mg Xr12h-Tab (Dextromethorphan-Guaifenesin) .... Take 1-2 Tablets Every 12 Hours As Needed 12)  Clotrimazole 10 Mg Troc (Clotrimazole) .... One 4 X Daily As Needed For Throat Irritation 13)  O2  - 2l At Rest, 4l With Exertion 14)  Hydrochlorothiazide 25 Mg Tabs (Hydrochlorothiazide) .... 1/2 Once Daily As Needed 15)  Vitamin D3 2000 Unit Caps (Cholecalciferol) .[name].. 1 Once Daily 16)  Niacin Cr 500 Mg Cr-Caps (Niacin) .[name].. 1 Tablet Once Daily 17)  Levothyroxine Sodium 200 Mcg Tabs (Levothyroxine Sodium) .[name].. 1 By Mouth  Once Daily 18)  [facility] Inhaler - ? Name 19)  Sleep Med Per [facility] 20)  Prednisone 10 Mg Tabs (Prednisone) .... 3po Qd For 3days, Then 2po Qd For 3days, Then 1po Qd For 3days, Then Stop 21)  Oxycodone Hcl 5 Mg Caps (Oxycodone Hcl) .[name].. 1 - 2 By Mouth Q 6 Hrs As Needed  Allergies (verified): No Known Drug Allergies  Past History:  Past Medical History: Last updated: 05/13/2010 HYPOTHYROIDISM (ICD-244.9)    - TSH  .276 12/209/10 HYPERTENSION (ICD-401.9) C O P D (ICD-496)...........................................................[name] Dr Sherene Sires      - HFA 50% July 31, 2009 >   90% September 26, 2009 > 90% February 07, 2010       -  PFT's September 26, 2009 FEV1 1.27 (38%) ratio 36 with 30% improvement  after B2, DLC0 35 corrects to 66 Right Pleural Calcified plaques s/p remote pna     - CT Chest 05/31/2010 > no change 05/08/10 Diabetes mellitus, type II Benign prostatic hypertrophy DJD Depression Coronary artery disease - non-obstructive by cath juen 2003 - Dr Myriam Jacobson [place]/card tremor gait disturbance - Dr Terrace Arabia - ? parkinsons  Past Surgical History: Last updated: 11/23/2009 Small Bowel Obstruction Repair with adhesiolysis hx of remote ileum resection due to bleeding Abdominal Aneurysm Repair s/p back surgury 1972, lumbar laminectomy - Dr Fannie Knee s/p left femoral embolectomy with left leg ischemia - Dr Sena Slate s/p right ganglion cyst - Dr Teressa Senter  Social History: Last updated: 11/23/2009 Patient states former smoker, quit in 2000 x49yrs, 2.5ppd.  Married Lives in the home with his wife Alcohol use-no Drug use-no 1 child  disabled veteran - sees [place] every 6 mo, for meds  Risk Factors: Smoking Status: quit (07/30/2009)  Review of Systems       all otherwise negative per pt -    Physical Exam  General:  alert and overweight-appearing.  , not il appaering Head:  normocephalic and atraumatic.   Eyes:  vision grossly intact, pupils equal, and pupils round.   Ears:  R ear normal and L ear  normal.   Nose:  no external deformity and no nasal discharge.   Mouth:  no gingival abnormalities and pharynx pink and moist.   Neck:  supple and no masses.   Lungs:  normal respiratory effort, R decreased breath sounds, and L decreased breath sounds.  , no frank wheezing or rales Heart:  normal rate and regular rhythm.   Msk:  no spine tender or paravertebral or buttock tender Extremities:  no edema, no erythema  Neurologic:  strength normal in all extremities, sensation intact to light touch, and DTRs symmetrical and normal.  except for RLE diffuse mild weakness 4+/5   Impression & Recommendations:  Problem # 1:  LUMBAR RADICULOPATHY, RIGHT (ICD-724.4)  with severe pain and RLE weakness - for MRI ls spine, and refer NS, for oxycodone as needed , and trial predpack  Orders: Radiology Referral (Radiology) Neurosurgeon Referral (Neurosurgeon)  His updated medication list for this problem includes:    Oxycodone Hcl 5 Mg Caps (Oxycodone hcl) .[name]... 1 - 2 by mouth q 6 hrs as needed  Problem # 2:  C O P D (ICD-496)  The following medications were removed from the medication list:    Symbicort 160-4.5 Mcg/act Aero (Budesonide-formoterol fumarate) .[name]... 2 puffs first thing  in am and 2 puffs again in pm about 12 hours later His updated medication list for this problem includes:    Spiriva Handihaler 18 Mcg Caps (Tiotropium bromide monohydrate) ..... Inhale contents of 1 cap daily    Proventil Hfa 108 (90 Base) Mcg/act Aers (Albuterol sulfate) .[name]... 2 puffs every 4 hours as needed pt not taking the symbicort for some time, acutally taking another type of inhaler;  I asked pt to let us know his meds to further gain an accurate list  Problem # 3:  DIABETES MELLITUS, TYPE II (ICD-250.00)  The following medications were removed from the medication list:    Glipizide 10 Mg Tabs (Glipizide) .[name]... 1 by mouth two times a day His updated medication list for this problem includes:    Losartan  Potassium 100 Mg Tabs (Losartan potassium) .[name]... 1/2 once daily    Metformin Hcl 1000 Mg  Tabs (Metformin hcl) ..... One twice daily    Lantus 100 Unit/ml Soln (Insulin glargine) .Marland KitchenMarland KitchenMarland KitchenMarland Kitchen 48 units or more subcutaneously once daily plus sliding scale  Labs Reviewed: Creat: 1.3 (05/07/2010)    Reviewed HgBA1c results: 8.4 (11/23/2009) recent uncontrolled, but pt prefers to follow endo at [facility] for rec's regarding tx and control  Problem # 4:  HYPERTENSION (ICD-401.9)  His updated medication list for this problem includes:    Losartan Potassium 100 Mg Tabs (Losartan potassium) .[name]... 1/2 once daily    Hydrochlorothiazide 25 Mg Tabs (Hydrochlorothiazide) .[name]... 1/2 once daily as needed  BP today: 124/62 Prior BP: 100/60 (05/13/2010)  Labs Reviewed: K+: 5.1 (05/07/2010) Creat: : 1.3 (05/07/2010)   Chol: 172 (11/23/2009)   HDL: 34.20 (11/23/2009)   LDL: 107 (11/23/2009)   TG: 154.0 (11/23/2009) stable overall by hx and exam, ok to continue meds/tx as is   Complete Medication List: 1)  Spiriva Handihaler 18 Mcg Caps (Tiotropium bromide monohydrate) .... Inhale contents of 1 cap daily 2)  Centrum Silver Tabs (Multiple vitamins-minerals) .[name].. 1 once daily 3)  Simvastatin 20 Mg Tabs (Simvastatin) .... 1/4 by mouth once daily 4)  Gabapentin 800 Mg Tabs (Gabapentin) .[name].. 1po two times a day 5)  Losartan Potassium 100 Mg Tabs (Losartan potassium) .... 1/2 once daily 6)  Requip 1 Mg Tabs (Ropinirole hcl) .... Take 1 tab by mouth at bedtime 7)  Flomax 0.4 Mg Caps (Tamsulosin hcl) .... Take 1 tab by mouth at bedtime 8)  Metformin Hcl 1000 Mg Tabs (Metformin hcl) .... One twice daily 9)  Lantus 100 Unit/ml Soln (Insulin glargine) .... 48 units or more subcutaneously once daily plus sliding scale 10)  Proventil Hfa 108 (90 Base) Mcg/act Aers (Albuterol sulfate) .... 2 puffs every 4 hours as needed 11)  Mucinex Dm 30-600 Mg Xr12h-tab (Dextromethorphan-guaifenesin) .... Take 1-2 tablets every 12 hours as  needed 12)  Clotrimazole 10 Mg Troc (Clotrimazole) .... One 4 x daily as needed for throat irritation 13)  O2 - 2l At Rest, 4l With Exertion  14)  Hydrochlorothiazide 25 Mg Tabs (Hydrochlorothiazide) .... 1/2 once daily as needed 15)  Vitamin D3 2000 Unit Caps (Cholecalciferol) .[name].. 1 once daily 16)  Niacin Cr 500 Mg Cr-caps (Niacin) .[name].. 1 tablet once daily 17)  Levothyroxine Sodium 200 Mcg Tabs (Levothyroxine sodium) .[name].. 1 by mouth once daily 18)  [facility] Inhaler - ? Name  19)  Sleep Med Per [facility]  20)  Prednisone 10 Mg Tabs (Prednisone) .... 3po qd for 3days, then 2po qd for 3days, then 1po qd for 3days, then stop 21)  Oxycodone Hcl 5 Mg Caps (Oxycodone hcl) .[name].. 1 - 2 by mouth q 6 hrs as needed  Patient Instructions: 1)  Please take all new medications as prescribed  - the oxycodone for pain, and the prednisone 2)  Continue all previous medications as before this visit (your list is updated to what you are actually taking;  please compare this list to your bottles at home, and call if there is any difference) 3)  You will be contacted about the referral(s) to: MRI for the lower back, and the Neurosurgury referral 4)  Please schedule a follow-up appointment in 6 months, or sooner if needed Prescriptions: OXYCODONE HCL 5 MG CAPS (OXYCODONE HCL) 1 - 2 by mouth q 6 hrs as needed  #100 x 0   Entered and Authorized by:   Corwin Levins MD   Signed by:   Corwin Levins MD on  06/05/2010   Method used:   Print then Give to Patient   RxID:   [id number] PREDNISONE 10 MG TABS (PREDNISONE) 3po qd for 3days, then 2po qd for 3days, then 1po qd for 3days, then stop  #18 x 0   Entered and Authorized by:   Corwin Levins MD   Signed by:   Corwin Levins MD on 06/05/2010   Method used:   Print then Give to Patient   RxID:   [id number]    Orders Added: 1)  Radiology Referral [Radiology] 2)  Neurosurgeon Referral [Neurosurgeon] 3)  Est. Patient Level IV [18841]

## 2010-08-29 NOTE — Assessment & Plan Note (Signed)
Summary: consult - former MW pt / cj   Visit Type:  Follow-up Primary Provider/Referring Provider:  Dr. [name] - PMD, Dr. Marchelle Gearing - Pulmonary,   CC:  Pt  prior pt of Dr. Sherene Sires and here to establish with Dr. Marchelle Gearing. [name]  History of Present Illness: 23  yowm quit smoking around 2000 with GOLD III COPD by PFT's 09/2009  July 31, 2009 cc sob at rest since June 2010 while of ace and high doses of propranonol seen in consutation 07/25/2009 rec no ace, d/c propranolo > reports  no better from hospitalization and on  02 just at night with dry cough but no cp.  Thoroughly confused with instructions, still on ACE though I had strongly rec he stop these.  August 15, 2009--Returns for follow up and med review. Last visit, Ace was stopped (again). He returns today feeling much better. Only using O2 intermittently, "afraid he will get dependent on this", We discussed how O2 works and problems with desaturations. He desats in the upper 70 with activity and at rest he averages 87-90% at rest. He is on mulitple meds. Gets most of meds via [place] system. He does have insurance as well.  September 26, 2009 Followup with PFT's.  Pt states breathing is the same which represents a marked improvement over baseline with no new complaints today. on 4lpm can walk flat fine as long as he paces himself with no sign variability. rec symbicort   February 07, 2010 cc  breathing is "pretty good".   can even push a mower now for 30 min wihout sotpping using 02 4lpm with anything but very slow adls vs a year ago sob at rest.  sleeps ok without resp problems or am exac. rarely needs saba (maybe twice a week) no cough.  >>no changes   PAGE 2      May 07, 2010 --Presents for an acute office visit. Complains coughing up blood for 3 days - Bright red blood - Occas 1 teaspoon in quantity - Occs 3-4 times a day - c/o pain in upper abdomen last week - Has h/o anuerysm surgery 8 yrs ago - SOB is about the same . [place] felt mucus in throat, when he coughed up, noticed bright red blood -that was 3 days ago. Has had on/off cough w/ blood tinged-bright red mucus mixed w/ blood -both at times -1/2 tsp in size. >RxAvelox  7d, ASA held CT chest /sinus w/ chronic COPD/Fibrotic changes, neg PE   May 13, 2010 Hemoptysis- still has some on occ.  Breathing "a little more shallow".  no longer using med calendar, when showed it to him acknowledged he's not using it the way it's written and only has a pillbox with two slots per day.  on 02 24 hours per day at 2lpm, marked improvement since f/u here in ex tol (off ace)  no epistaxis.  >>no changes  June 04, 2010--Presents for follow up and med review. We reviewed all his meds and organized them into his med calendar.  He has had no further hemoptysis or episataxis. Previous CT chest w/ neg acute changes.  He feels he is back to baseline. Denies chest pain,  orthopnea, hemoptysis, fever, n/v/d, edema, headache, .  June 18, 2010: EX 100 pack year smoker. Home O2 dependent Gold stage 3 COPD with low DLCO 35%  in PFTs MArch 2011. BAeline pulse ox is 87-93% on 2L [place]. In,  OCt 2011 Ct showing some UIP component but predmoninant COPD on CT. Transferring care from Dr. Sherene Sires to myself.  In Oct 2011 had some mild AECOPD related hemoptysis with CT showing only UIP but not showing any mass. Since then hemoptysis resolved. Currently feels baseline on 2-3L [place]. Claudication lmits him more than dyspnea at 1/4 th mile. Denies chronic cough but on mucinex and feels it helps. On spiriva, foradil and asmanex through [facility]. Has not done rehab though wife is attending rehab. Denies chest pains, edema, wheezing, fever, colored sputum.    Preventive Screening-Counseling & Management  Alcohol-Tobacco     Smoking Status: quit     Packs/Day: 2.5     Year Started: 1960     Year Quit: 2000     Pack years: 100  Current Medications (verified): 1)  Spiriva Handihaler 18 Mcg Caps  (Tiotropium Bromide Monohydrate) .... Inhale Contents of 1 Cap Daily 2)  Foradil Aerolizer 12 Mcg Caps (Formoterol Fumarate) .... Inhale Contents of 1 Capsule Two Times A Day 3)  Asmanex 60 Metered Doses 220 Mcg/inh Aepb (Mometasone Furoate) .... Inhale 2 Puffs By Mouth At Bedtime 4)  Simvastatin 20 Mg Tabs (Simvastatin) .... 1/4 By Mouth Once Daily 5)  Slo-Niacin 500 Mg Cr-Tabs (Niacin) .... Take 1 Tab By Mouth At Bedtime 6)  Gabapentin 800 Mg Tabs (Gabapentin) .[name].. 1 Tab By Mouth  Every Morning and At Bedtime 7)  Losartan Potassium 100 Mg Tabs (Losartan Potassium) .... 1/2 Once Daily 8)  Hydrochlorothiazide 25 Mg Tabs (Hydrochlorothiazide) .... 1/2 Tab By Mouth Once Daily 9)  Levothyroxine Sodium 200 Mcg Tabs (Levothyroxine Sodium) .[name].. 1 Tab By Mouth  Every Morning and At Bedtime 10)  Doxepin Hcl 50 Mg Caps (Doxepin Hcl) .... Take 1 Capsule By Mouth At Bedtime 11)  Doxepin Hcl 10 Mg Caps (Doxepin Hcl) .[name].. 1 Capsule By Mouth  Every Morning and in The Evening 12)  Flomax 0.4 Mg Caps (Tamsulosin Hcl) .... Take 1 Tab By Mouth At Bedtime 13)  Aspirin 81 Mg Tbec (Aspirin) .... Take 1 Tablet By Mouth Once A Day 14)  Metformin Hcl 1000 Mg Tabs (Metformin Hcl) .... One Twice Daily 15)  Novolog 100 Unit/ml Soln (Insulin Aspart) .... Sliding Scale As Needed With Meals 16)  Lantus 100 Unit/ml Soln (Insulin Glargine) .... 45 Units Subcutaneously Once Daily With Dinner 17)  Mega Multi Men  Cr-Tabs (Multiple Vitamins-Minerals) .... Take 1 Tablet By Mouth Once A Day 18)  Vitamin D3 2000 Unit Caps (Cholecalciferol) .[name].. 1 Once Daily 19)  Requip 1 Mg Tabs (Ropinirole Hcl) .... Take 1 Tab By Mouth At Bedtime 20)  Finasteride 5 Mg Tabs (Finasteride) .... Take 1 Tablet By Mouth Once A Day 21)  Oxygen .... Wear 2l/min Continuously 22)  Proair Hfa 108 (90 Base) Mcg/act Aers (Albuterol Sulfate) .... 2 Puffs Every 4 Hours As Needed 23)  Guaifenesin 400 Mg Tabs (Guaifenesin) .... 2tabs By Mouth Two Times A Day As  Needed 24)  Zyrtec Allergy 10 Mg Tabs (Cetirizine Hcl) .... Take 1 Tab By Mouth At Bedtime As Needed 25)  Oxygen .... Increase To 4l/min With Activity As Needed 26)  Oxycodone Hcl 5 Mg Caps (Oxycodone Hcl) .[name].. 1 - 2 By Mouth Q 6 Hrs As Needed  Allergies (verified): No Known Drug Allergies  Past History:  Past medical, surgical, family and social histories (including risk factors) reviewed, and no changes noted (except as noted below).  Past Medical History: HYPOTHYROIDISM (ICD-244.9)    - TSH  .  276 12/209/10 HYPERTENSION (ICD-401.9) C O P D (ICD-496)...........................................................[name] Dr Sherene Sires      - HFA 50% July 31, 2009 >   90% September 26, 2009 > 90% February 07, 2010       - PFT's September 26, 2009 FEV1 1.27 (38%) ratio 36 with 30% improvement  after B2, DLC0 35 corrects to 66       -CT sinus neg.  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 parkinsons complex med regimen--Meds reviewed with pt education and computerized med calendar 06/04/10  Past Surgical History: Reviewed history from 11/23/2009 and no changes required. Small Bowel Obstruction Repair with adhesiolysis hx of remote ileum resection due to bleeding Abdominal Aneurysm Repair s/p back surgury 1972, lumbar laminectomy - Dr Fannie Knee s/p left femoral embolectomy with left leg ischemia - Dr Sena Slate s/p right ganglion cyst - Dr Teressa Senter  Family History: Reviewed history from 07/31/2009 and no changes required. Negative for respiratory diseases or atopy   Social History: Reviewed history from 11/23/2009 and no changes required. Patient states former smoker, quit in 2000 x95yrs, 2.5ppd.    Married Lives in the home with his wife Alcohol use-no Drug use-no 1 child  disabled veteran - sees [place] every 6 mo, for meds Ex- [employer] Denies asbestos exposurePacks/Day:  2.5 Pack years:  100  Review of Systems       The patient complains of shortness of breath with activity, anxiety, depression, and joint stiffness or pain.  The patient denies productive cough, non-productive cough, coughing up blood, chest pain, irregular heartbeats, acid heartburn, indigestion, loss of appetite, weight change, abdominal pain, difficulty swallowing, sore throat, tooth/dental problems, headaches, nasal congestion/difficulty breathing through nose, sneezing, itching, ear ache, hand/feet swelling, rash, change in color of mucus, and fever.    Vital Signs:  Patient profile:   70 year old male Height:      74.5 inches Weight:      252.38 pounds BMI:     32.09 O2 Sat:      87 % on 2 L/min Temp:     97.9 degrees F oral Pulse rate:   94 / minute BP sitting:   118 / 76  (right arm) Cuff size:   large  Vitals Entered By: Carron Curie CMA (June 18, 2010 1:48 PM)  O2 Flow:  2 L/min  O2 Sat Comments upon arrival pt was on 2 liters and sats were 87%. pt took some deep breaths and sats increased to 90% on 2 liters.  CC: Pt  prior pt of Dr. Sherene Sires, here to establish with Dr. Marchelle Gearing.  Comments Medications reviewed with patient Carron Curie CMA  June 18, 2010 1:48 PM Daytime phone number verified with patient.    Physical Exam  General:  well developed, well nourished, in no acute distressobese.   Head:  normocephalic and atraumatic Eyes:  PERRLA/EOM intact; conjunctiva and sclera clear Ears:  TMs intact and clear with normal canals Nose:  no deformity, discharge, inflammation, or lesions Mouth:  no deformity or lesions Neck:  no masses, thyromegaly, or abnormal cervical nodes Chest Wall:  no deformities noted Lungs:  decreased BS bilateral and coarse velcro crackles at  bases  Heart:  regular rate and rhythm, S1, S2 without murmurs, rubs, gallops, or clicks Abdomen:  bowel sounds positive; abdomen soft and non-tender without masses, or organomegaly Msk:  no deformity or scoliosis noted with normal posture Pulses:  pulses normal Extremities:  no clubbing, cyanosis, edema, or deformity noted Neurologic:  CN II-XII grossly intact with normal reflexes, coordination, muscle strength and tone Skin:  intact without lesions or rashes Cervical Nodes:  no significant adenopathy Axillary Nodes:  no significant adenopathy Psych:  alert and cooperative; normal mood and affect; normal attention span and concentration   MISC. Report  Procedure date:  06/18/2010  Findings:      n PFTs MArch 2011. BAeline pulse ox is 87-93% on 2L [place]. In,  OCt 2011 Ct showing some UIP component but predmoninant COPD on CT.  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 PFts also show out opf proprotion low DLCO c/w associated COPD and Pulmonary fibrosis. Denies asbestos exposure. 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   plan check eser, HP panel, ANA, RF start NAC 600mg  by mouth three times a day explained progressive  nature of disease (he is a bit taken aback) pumonary rehab o2 to keep pox >88% next visit check full PFTs for progression No other specific Rx dpeending on rehab course could consider transplant referral but he has multiple medical problems and needs to loose weight not a candidate for ASCEND PIRFENIDONE trial due to overwhelming copd  Problem # 2:  C O P D (ICD-496) Assessment: Unchanged currently stable  plan continue mdi per [facility] protocol refer pulmonary rehab next visit check alpha 1  Medications Added to Medication List This Visit: 1)  Novolog 100 Unit/ml Soln (Insulin aspart) .... Sliding scale as needed with meals 2)  Nac 600 600 Mg Caps  (Acetylcysteine) .[name].. 1 cap three times a day  Other Orders: T-Antinuclear Antib (ANA) [id number]) T-Rheumatoid Factor [phone]) TLB-Sedimentation Rate (ESR) (85652-ESR) T- * Misc. Laboratory test [phone]) Rehabilitation Referral (Rehab) Pulmonary Referral (Pulmonary) Est. Patient Level IV (62952)  Patient Instructions: 1)  #COPD 2)   - continue your inhalers 3)   - start pulmonary rehab 4)  #PULMONARY FIBROSIS 5)   - wear oxygen 6)   - atttend rehab 7)   - go to [email or web address] and take NAC 600mg  three times a day 8)   - do blood work today 9)  #RETURN 10)   - 3 months 11)  - full PFT at return Prescriptions: NAC 600 600 MG CAPS (ACETYLCYSTEINE) 1 cap three times a day  #90 x 12   Entered and Authorized by:   Kalman Shan MD   Signed by:   Kalman Shan MD on 06/18/2010   Method used:   Print then Give to Patient   RxID:   [id number]

## 2010-08-29 NOTE — Assessment & Plan Note (Signed)
Summary: new pt/medicare/bcbs/#/lb   Vital Signs:  Patient profile:   70 year old male Height:      75 inches Weight:      251 pounds BMI:     31.49 O2 Sat:      92 % on Room air Temp:     97.8 degrees F oral Pulse rate:   58 / minute BP sitting:   122 / 72  (left arm) Cuff size:   large  Vitals Entered ByZella Ball Ewing (November 23, 2009 10:53 AM)  O2 Flow:  Room air  Preventive Care Screening  Colonoscopy:    Date:  07/28/2006    Next Due:  07/2011    Results:  normal per pt    Last Tetanus Booster:    Date:  11/23/2009    Results:  Td  CC: New Pt, Medicare/RE   Primary Care Provider:  Dr. Eula Listen  CC:  New Pt and Medicare/RE.  History of Present Illness: just back from [place]  - [facility]  MD with zpack, wheezing not signnificant worse;  goes to the gym occasionally ;  Pt denies CP, sob, doe, wheezing, orthopnea, pnd, worsening LE edema, palps, dizziness or syncope  Pt denies new neuro symptoms such as headache, facial or extremity weakness   .  Has significant fatigue but not sure if this is any worse than usual, thinks probably at baseline.  No OSA symtpoms such as snoring or daytime somnolence.   Pt denies polydipsia, polyuria, or low sugar symptoms such as shakiness improved with eating.  Overall good compliance with meds, trying to follow low chol, DM diet, wt stable, little excercise however   Preventive Screening-Counseling & Management      Drug Use:  no.    Problems Prior to Update: 1)  Special Screening Malig Neoplasms Other Sites  (ICD-V76.49) 2)  Fatigue  (ICD-780.79) 3)  Coronary Artery Disease  (ICD-414.00) 4)  Depression  (ICD-311) 5)  Degenerative Joint Disease  (ICD-715.90) 6)  Benign Prostatic Hypertrophy  (ICD-600.00) 7)  Diabetes Mellitus, Type II  (ICD-250.00) 8)  Thrush  (ICD-112.0) 9)  Respiratory Failure, Chronic  (ICD-518.83) 10)  Hypothyroidism  (ICD-244.9) 11)  Hypertension  (ICD-401.9) 12)  C O P D   (ICD-496)  Medications Prior to Update: 1)  Spiriva Handihaler 18 Mcg Caps (Tiotropium Bromide Monohydrate) .... Inhale Contents of 1 Cap Daily 2)  Centrum Silver  Tabs (Multiple Vitamins-Minerals) .[name].. 1 Once Daily 3)  Gemfibrozil 600 Mg Tabs (Gemfibrozil) .[name].. 1 Tab By Mouth  Every Morning and At Bedtime 4)  Crestor 20 Mg Tabs (Rosuvastatin Calcium) .... 1/2 At Bedtime 5)  Levothyroxine Sodium 100 Mcg Tabs (Levothyroxine Sodium) .... One Daily 6)  Levothyroxine Sodium 112 Mcg Tabs (Levothyroxine Sodium) .[name].. 1 By Mouth Daily 7)  Gabapentin 800 Mg Tabs (Gabapentin) .[name].. 1 Three Times A Day 8)  Diovan Hct 160-12.5 Mg Tabs (Valsartan-Hydrochlorothiazide) .... Take 1 Tablet By Mouth Once A Day 9)  Requip 1 Mg Tabs (Ropinirole Hcl) .... Take 1 Tab By Mouth At Bedtime 10)  Fluoxetine Hcl 20 Mg Tabs (Fluoxetine Hcl) .... 2 By Mouth Daily 11)  Finasteride 5 Mg Tabs (Finasteride) .... Take 1 Tab By Mouth At Bedtime 12)  Flomax 0.4 Mg Caps (Tamsulosin Hcl) .... Take 1 Tab By Mouth At Bedtime 13)  Glipizide 10 Mg Tabs (Glipizide) .[name].. 1 By Mouth Two Times A Day 14)  Metformin Hcl 1000 Mg Tabs (Metformin Hcl) .... One Twice Daily 15)  Aspirin 81 Mg Tbec (Aspirin) .[name].. 1 By Mouth Daily 16)  Lantus 100 Unit/ml Soln (Insulin Glargine) .... As Directed 17)  Clonazepam 1 Mg Tabs (Clonazepam) .... Take 1 Tab By Mouth At Bedtime 18)  Proventil Hfa 108 (90 Base) Mcg/act Aers (Albuterol Sulfate) .... 2 Puffs Every 4 Hours As Needed 19)  Mucinex Dm 30-600 Mg Xr12h-Tab (Dextromethorphan-Guaifenesin) .... Take 1-2 Tablets Every 12 Hours As Needed 20)  Zyrtec Allergy 10 Mg Tabs (Cetirizine Hcl) .... Take 1 Tab By Mouth At Bedtime As Needed 21)  Symbicort 160-4.5 Mcg/act  Aero (Budesonide-Formoterol Fumarate) .... 2 Puffs First Thing  in Am and 2 Puffs Again in Pm About 12 Hours Later 22)  Clotrimazole 10 Mg Troc (Clotrimazole) .... One 4 X Daily As Needed For Throat Irritation  Current Medications (verified): 1)   Spiriva Handihaler 18 Mcg Caps (Tiotropium Bromide Monohydrate) .... Inhale Contents of 1 Cap Daily 2)  Centrum Silver  Tabs (Multiple Vitamins-Minerals) .[name].. 1 Once Daily 3)  Gemfibrozil 600 Mg Tabs (Gemfibrozil) .[name].. 1 Tab By Mouth  Every Morning and At Bedtime 4)  Crestor 20 Mg Tabs (Rosuvastatin Calcium) .... 1/2 At Bedtime 5)  Levothyroxine Sodium 100 Mcg Tabs (Levothyroxine Sodium) .... One Daily 6)  Levothyroxine Sodium 112 Mcg Tabs (Levothyroxine Sodium) .[name].. 1 By Mouth Daily 7)  Gabapentin 800 Mg Tabs (Gabapentin) .[name].. 1 Three Times A Day 8)  Diovan Hct 160-12.5 Mg Tabs (Valsartan-Hydrochlorothiazide) .... Take 1 Tablet By Mouth Once A Day 9)  Requip 1 Mg Tabs (Ropinirole Hcl) .... Take 1 Tab By Mouth At Bedtime 10)  Fluoxetine Hcl 20 Mg Tabs (Fluoxetine Hcl) .... 2 By Mouth Daily 11)  Finasteride 5 Mg Tabs (Finasteride) .... Take 1 Tab By Mouth At Bedtime 12)  Flomax 0.4 Mg Caps (Tamsulosin Hcl) .... Take 1 Tab By Mouth At Bedtime 13)  Glipizide 10 Mg Tabs (Glipizide) .[name].. 1 By Mouth Two Times A Day 14)  Metformin Hcl 1000 Mg Tabs (Metformin Hcl) .... One Twice Daily 15)  Aspirin 81 Mg Tbec (Aspirin) .[name].. 1 By Mouth Daily 16)  Lantus 100 Unit/ml Soln (Insulin Glargine) .... As Directed 17)  Clonazepam 1 Mg Tabs (Clonazepam) .... Take 1 Tab By Mouth At Bedtime 18)  Proventil Hfa 108 (90 Base) Mcg/act Aers (Albuterol Sulfate) .... 2 Puffs Every 4 Hours As Needed 19)  Mucinex Dm 30-600 Mg Xr12h-Tab (Dextromethorphan-Guaifenesin) .... Take 1-2 Tablets Every 12 Hours As Needed 20)  Zyrtec Allergy 10 Mg Tabs (Cetirizine Hcl) .... Take 1 Tab By Mouth At Bedtime As Needed 21)  Symbicort 160-4.5 Mcg/act  Aero (Budesonide-Formoterol Fumarate) .... 2 Puffs First Thing  in Am and 2 Puffs Again in Pm About 12 Hours Later 22)  Clotrimazole 10 Mg Troc (Clotrimazole) .... One 4 X Daily As Needed For Throat Irritation 23)  Home O2  - 2l At Rest, 4l With Exertion  Allergies (verified): No Known  Drug Allergies  Past History:  Family History: Last updated: 07/31/2009 Negative for respiratory diseases or atopy   Social History: Last updated: 11/23/2009 Patient states former smoker, quit in 2000 x51yrs, 2.5ppd.  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 Negative for respiratory diseases or atopy   Social History: Reviewed history from 08/15/2009 and no changes required. Patient states former smoker, quit in 2000 x49yrs, 2.5ppd.  Married Lives in the home with his wife Alcohol use-no Drug use-no 1 child  disabled veteran - sees [place] every 6 mo, for medsDrug Use:  no  Review of Systems  The patient denies anorexia, fever, weight loss, weight gain, vision loss, decreased hearing, hoarseness, chest pain, syncope, dyspnea on exertion, peripheral edema, headaches, hemoptysis, abdominal pain, melena, hematochezia,  severe indigestion/heartburn, hematuria, muscle weakness, suspicious skin lesions, transient blindness, difficulty walking, depression, unusual weight change, abnormal bleeding, enlarged lymph nodes, and angioedema.         all otherwise negative per pt -  also with right hand tremor, mild  Physical Exam  General:  alert and overweight-appearing.   Head:  normocephalic and atraumatic.   Eyes:  vision grossly intact, pupils equal, and pupils round.   Ears:  R ear normal and L ear normal.   Nose:  no external deformity and no nasal discharge.   Mouth:  no gingival abnormalities and pharynx pink and moist.   Neck:  supple and no masses.   Lungs:  normal respiratory effort, R decreased breath sounds, and L decreased breath sounds.   Heart:  normal rate and regular rhythm.   Abdomen:  soft, non-tender, and normal bowel sounds.   Msk:  no joint tenderness and no joint swelling.   Extremities:  no edema, no erythema  Neurologic:  cranial nerves II-XII intact and strength normal in all extremities.   Skin:  color normal and no rashes.   Psych:  normally interactive and slightly anxious.     Impression & Recommendations:  Problem # 1:  Preventive Health Care (ICD-V70.0)  Overall doing well, age appropriate education and counseling updated and referral for appropriate preventive services done unless declined, immunizations up to date or declined, diet counseling done if overweight, urged to quit smoking if smokes , most recent labs reviewed and current ordered if appropriate, ecg reviewed or declined (interpretation per ECG scanned in the EMR if done); information regarding Medicare Prevention requirements given if appropriate   Orders: First annual wellness visit with prevention plan  (Z6109)  Problem # 2:  FATIGUE (ICD-780.79) exam benign, to check labs below; follow with expectant management  Orders: T-Vitamin D (25-Hydroxy) (60454-09811) TLB-CBC Platelet - w/Differential  (85025-CBCD) TLB-Hepatic/Liver Function Pnl (80076-HEPATIC) TLB-TSH (Thyroid Stimulating Hormone) (84443-TSH) TLB-Udip ONLY (81003-UDIP) TLB-Sedimentation Rate (ESR) (85652-ESR) TLB-IBC Pnl (Iron/FE;Transferrin) (83550-IBC) TLB-B12 + Folate Pnl (82746_82607-B12/FOL)  Problem # 3:  DIABETES MELLITUS, TYPE II (ICD-250.00)  His updated medication list  for this problem includes:    Diovan Hct 160-12.5 Mg Tabs (Valsartan-hydrochlorothiazide) .[name]... Take 1 tablet by mouth once a day    Glipizide 10 Mg Tabs (Glipizide) .[name]... 1 by mouth two times a day    Metformin Hcl 1000 Mg Tabs (Metformin hcl) ..... One twice daily    Aspirin 81 Mg Tbec (Aspirin) .[name]... 1 by mouth daily    Lantus 100 Unit/ml Soln (Insulin glargine) .[name]... As directed  Orders: TLB-A1C / Hgb A1C (Glycohemoglobin) (83036-A1C) TLB-BMP (Basic Metabolic Panel-BMET) (80048-METABOL) TLB-Lipid Panel (80061-LIPID) stable overall by hx and exam, ok to continue meds/tx as is   Problem # 4:  HYPERTENSION (ICD-401.9)  His updated medication list for this problem includes:    Diovan Hct 160-12.5 Mg Tabs (Valsartan-hydrochlorothiazide) .[name]... Take 1 tablet by mouth once a day  BP today: 122/72 Prior BP: 130/80 (11/06/2009) stable overall by hx and exam, ok to continue meds/tx as is   Orders: Prescription Created Electronically [phone])  Problem # 5:  BRONCHITIS-ACUTE (ICD-466.0)  His updated medication list for this problem includes:    Spiriva Handihaler 18 Mcg Caps (Tiotropium bromide monohydrate) ..... Inhale contents of 1 cap daily    Proventil Hfa 108 (90 Base) Mcg/act Aers (Albuterol sulfate) .[name]... 2 puffs every 4 hours as needed    Mucinex Dm 30-600 Mg Xr12h-tab (Dextromethorphan-guaifenesin) .[name]... Take 1-2 tablets every 12 hours as needed    Symbicort 160-4.5 Mcg/act Aero (Budesonide-formoterol fumarate) .[name]... 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 Inhale contents of 1 cap daily 2)  Centrum Silver Tabs (Multiple vitamins-minerals) .[name].. 1 once daily 3)  Gemfibrozil 600 Mg Tabs (Gemfibrozil) .[name].. 1 tab by mouth  every morning and at bedtime 4)  Crestor 20 Mg Tabs (Rosuvastatin calcium) .... 1/2 at bedtime 5)  Levothyroxine Sodium 100 Mcg Tabs (Levothyroxine sodium) .... One daily 6)  Levothyroxine Sodium 112 Mcg Tabs (Levothyroxine sodium) .[name].. 1 by mouth daily 7)  Gabapentin 800 Mg Tabs (Gabapentin) .[name].. 1 three times a day 8)  Diovan Hct 160-12.5 Mg Tabs (Valsartan-hydrochlorothiazide) .... Take 1 tablet by mouth once a day 9)  Requip 1 Mg Tabs (Ropinirole hcl) .... Take 1 tab by mouth at bedtime 10)  Fluoxetine Hcl 20 Mg Tabs (Fluoxetine hcl) .... 2 by mouth daily 11)  Finasteride 5 Mg Tabs (Finasteride) .... Take 1 tab by mouth at bedtime 12)  Flomax 0.4 Mg Caps (Tamsulosin hcl) .... Take 1 tab by mouth at bedtime 13)  Glipizide 10 Mg Tabs (Glipizide) .[name].. 1 by mouth two times a day 14)  Metformin Hcl 1000 Mg Tabs (Metformin hcl) .... One twice daily 15)  Aspirin 81 Mg Tbec (Aspirin) .[name].. 1 by mouth daily 16)  Lantus 100 Unit/ml Soln (Insulin glargine) .... As directed 17)  Clonazepam 1 Mg Tabs (Clonazepam) .... Take 1 tab by mouth at bedtime 18)  Proventil Hfa 108 (90 Base) Mcg/act Aers (Albuterol sulfate) .... 2 puffs every 4 hours as needed 19)  Mucinex Dm 30-600 Mg Xr12h-tab (Dextromethorphan-guaifenesin) .... Take 1-2 tablets every 12 hours as needed 20)  Zyrtec Allergy 10 Mg Tabs (Cetirizine hcl) .... Take 1 tab by mouth at bedtime as  needed 21)  Symbicort 160-4.5 Mcg/act Aero (Budesonide-formoterol fumarate) .... 2 puffs first thing  in am and 2 puffs again in pm about 12 hours  later 22)  Clotrimazole 10 Mg Troc (Clotrimazole) .... One 4 x daily as needed for throat irritation 23)  Home O2 - 2l At Rest, 4l With Exertion   Other Orders: TD Toxoids IM 7 YR + (81191) Admin 1st Vaccine (47829) TLB-PSA (Prostate Specific Antigen) (84153-PSA)  Patient Instructions: 1)  you had the tetanus shot today 2)  Continue all previous medications as before this visit 3)  Please go to the Lab in the basement for your blood and/or urine tests today 4)  Please schedule a follow-up appointment in 3 months. or sooner if needed   Immunizations Administered:  Tetanus Vaccine:    Vaccine Type: Td    Site: left deltoid    Mfr: [employer] Pasteur    Dose: 0.5 ml    Route: IM    Given by: Zella Ball Ewing    Exp. Date: 08/10/2011    Lot #: [id number]    VIS given: 06/15/07 version given November 23, 2009.

## 2010-08-29 NOTE — Assessment & Plan Note (Signed)
Summary: cough---and seen saturday clinic for leg swelling  stc   Vital Signs:  Patient profile:   70 year old [place] Height:      75 inches Weight:      253.50 pounds BMI:     31.80 O2 Sat:      91 % on Room air Temp:     97.8 degrees F oral Pulse rate:   80 / minute BP sitting:   120 / 72  (left arm) Cuff size:   large  Vitals Entered ByZella Ball Ewing (Dec 10, 2009 4:43 PM)  O2 Flow:  Room air CC: cough, no energy/RE   Primary Care Provider:  Dr. Eula Listen  CC:  cough and no energy/RE.  History of Present Illness: here to f/u sat clinic eval may 14;  leg swelling nicely resolved with only one dose of the lasix so he has only taken one dose;  unfortunately cough/sob/doe not improved - in anything mild worse, despite zpack; denies wheezing and Pt denies CP, orthopnea, pnd, worsening LE edema, palps, dizziness or syncope . No high fever or chills, and cough not productive.   Overall good complaicne and tolerance of meds.    also mentions increased balance difficutly with now frequent holding onto walls and running into people frequently with standing and walking;  does not use cane or walker;  no recent falls or injury, back pain, or other ortho complaint, or known neuropathy worsening.  He and wife (who is not here) very interested in neuro eval.    Problems Prior to Update: 1)  Dyspnea/shortness of Breath  (ICD-786.09) 2)  Preventive Health Care  (ICD-V70.0) 3)  Tremor  (ICD-781.0) 4)  Bronchitis-acute  (ICD-466.0) 5)  Special Screening Malig Neoplasms Other Sites  (ICD-V76.49) 6)  Fatigue  (ICD-780.79) 7)  Coronary Artery Disease  (ICD-414.00) 8)  Depression  (ICD-311) 9)  Degenerative Joint Disease  (ICD-715.90) 10)  Benign Prostatic Hypertrophy  (ICD-600.00) 11)  Diabetes Mellitus, Type II  (ICD-250.00) 12)  Thrush  (ICD-112.0) 13)  Respiratory Failure, Chronic  (ICD-518.83) 14)  Hypothyroidism  (ICD-244.9) 15)  Hypertension  (ICD-401.9) 16)  C O P D   (ICD-496)  Medications Prior to Update: 1)  Spiriva Handihaler 18 Mcg Caps (Tiotropium Bromide Monohydrate) .... Inhale Contents of 1 Cap Daily 2)  Centrum Silver  Tabs (Multiple Vitamins-Minerals) .[name].. 1 Once Daily 3)  Gemfibrozil 600 Mg Tabs (Gemfibrozil) .[name].. 1 Tab By Mouth  Every Morning and At Bedtime 4)  Crestor 20 Mg Tabs (Rosuvastatin Calcium) .... 1/2 At Bedtime 5)  Levothyroxine Sodium 100 Mcg Tabs (Levothyroxine Sodium) .... One Daily 6)  Levothyroxine Sodium 112 Mcg Tabs (Levothyroxine Sodium) .[name].. 1 By Mouth Daily 7)  Gabapentin 800 Mg Tabs (Gabapentin) .[name].. 1 Three Times A Day 8)  Diovan Hct 160-12.5 Mg Tabs (Valsartan-Hydrochlorothiazide) .... Take 1 Tablet By Mouth Once A Day 9)  Requip 1 Mg Tabs (Ropinirole Hcl) .... Take 1 Tab By Mouth At Bedtime 10)  Fluoxetine Hcl 20 Mg Tabs (Fluoxetine Hcl) .... 2 By Mouth Daily 11)  Finasteride 5 Mg Tabs (Finasteride) .... Take 1 Tab By Mouth At Bedtime 12)  Flomax 0.4 Mg Caps (Tamsulosin Hcl) .... Take 1 Tab By Mouth At Bedtime 13)  Glipizide 10 Mg Tabs (Glipizide) .[name].. 1 By Mouth Two Times A Day 14)  Metformin Hcl 1000 Mg Tabs (Metformin Hcl) .... One Twice Daily 15)  Aspirin 81 Mg Tbec (Aspirin) .[name].. 1 By Mouth Daily 16)  Lantus 100 Unit/ml Soln (Insulin  Glargine) .... 75 Units Subcutaneously Once Daily 17)  Clonazepam 1 Mg Tabs (Clonazepam) .... Take 1 Tab By Mouth At Bedtime 18)  Proventil Hfa 108 (90 Base) Mcg/act Aers (Albuterol Sulfate) .... 2 Puffs Every 4 Hours As Needed 19)  Mucinex Dm 30-600 Mg Xr12h-Tab (Dextromethorphan-Guaifenesin) .... Take 1-2 Tablets Every 12 Hours As Needed 20)  Zyrtec Allergy 10 Mg Tabs (Cetirizine Hcl) .... Take 1 Tab By Mouth At Bedtime As Needed 21)  Symbicort 160-4.5 Mcg/act  Aero (Budesonide-Formoterol Fumarate) .... 2 Puffs First Thing  in Am and 2 Puffs Again in Pm About 12 Hours Later 22)  Clotrimazole 10 Mg Troc (Clotrimazole) .... One 4 X Daily As Needed For Throat Irritation 23)  Home O2   - 2l At Rest, 4l With Exertion 24)  Furosemide 40 Mg Tabs (Furosemide) .[name].. 1 Tab Daily As Directed For Increased Fluid 25)  Azithromycin 250 Mg Tabs (Azithromycin) .... 2 Tabs Today, Then 1 Tab Daily For The Next 4 Days  Current Medications (verified): 1)  Spiriva Handihaler 18 Mcg Caps (Tiotropium Bromide Monohydrate) .... Inhale Contents of 1 Cap Daily 2)  Centrum Silver  Tabs (Multiple Vitamins-Minerals) .[name].. 1 Once Daily 3)  Gemfibrozil 600 Mg Tabs (Gemfibrozil) .[name].. 1 Tab By Mouth  Every Morning and At Bedtime 4)  Crestor 20 Mg Tabs (Rosuvastatin Calcium) .... 1/2 At Bedtime 5)  Levothyroxine Sodium 100 Mcg Tabs (Levothyroxine Sodium) .... One Daily 6)  Levothyroxine Sodium 112 Mcg Tabs (Levothyroxine Sodium) .[name].. 1 By Mouth Daily 7)  Gabapentin 800 Mg Tabs (Gabapentin) .[name].. 1 Three Times A Day 8)  Diovan Hct 160-12.5 Mg Tabs (Valsartan-Hydrochlorothiazide) .... Take 1 Tablet By Mouth Once A Day 9)  Requip 1 Mg Tabs (Ropinirole Hcl) .... Take 1 Tab By Mouth At Bedtime 10)  Fluoxetine Hcl 20 Mg Tabs (Fluoxetine Hcl) .... 2 By Mouth Daily 11)  Finasteride 5 Mg Tabs (Finasteride) .... Take 1 Tab By Mouth At Bedtime 12)  Flomax 0.4 Mg Caps (Tamsulosin Hcl) .... Take 1 Tab By Mouth At Bedtime 13)  Glipizide 10 Mg Tabs (Glipizide) .[name].. 1 By Mouth Two Times A Day 14)  Metformin Hcl 1000 Mg Tabs (Metformin Hcl) .... One Twice Daily 15)  Aspirin 81 Mg Tbec (Aspirin) .[name].. 1 By Mouth Daily 16)  Lantus 100 Unit/ml Soln (Insulin Glargine) .... 75 Units Subcutaneously Once Daily 17)  Clonazepam 1 Mg Tabs (Clonazepam) .... Take 1 Tab By Mouth At Bedtime 18)  Proventil Hfa 108 (90 Base) Mcg/act Aers (Albuterol Sulfate) .... 2 Puffs Every 4 Hours As Needed 19)  Mucinex Dm 30-600 Mg Xr12h-Tab (Dextromethorphan-Guaifenesin) .... Take 1-2 Tablets Every 12 Hours As Needed 20)  Zyrtec Allergy 10 Mg Tabs (Cetirizine Hcl) .... Take 1 Tab By Mouth At Bedtime As Needed 21)  Symbicort 160-4.5 Mcg/act  Aero  (Budesonide-Formoterol Fumarate) .... 2 Puffs First Thing  in Am and 2 Puffs Again in Pm About 12 Hours Later 22)  Clotrimazole 10 Mg Troc (Clotrimazole) .... One 4 X Daily As Needed For Throat Irritation 23)  Home O2  - 2l At Rest, 4l With Exertion 24)  Furosemide 40 Mg Tabs (Furosemide) .[name].. 1 Tab Daily As Directed For Increased Fluid 25)  Azithromycin 250 Mg Tabs (Azithromycin) .... 2 Tabs Today, Then 1 Tab Daily For The Next 4 Days Dakota)  Avelox 400 Mg Tabs (Moxifloxacin Hcl) .[name].. 1po Once Daily  Allergies (verified): No Known Drug Allergies  Past History:  Past Medical History: Last updated: 11/23/2009 HYPOTHYROIDISM (ICD-244.9)    -  TSH  .276 12/209/10 HYPERTENSION (ICD-401.9) C O P D (ICD-496)                           - Dr Sherene Sires      - HFA 50% July 31, 2009 >   90% September 26, 2009       - PFT's September 26, 2009 FEV1 1.27 (38%) ratio 36 with 30% improvement  after B2, DLC0 35 corrects to 66 Right Pleural Calcified plaques s/p remote pna     - CT Chest 05/31/2010 Diabetes mellitus, type II Benign prostatic hypertrophy DJD Depression Coronary artery disease - non-obstructive by cath juen 2003 - Dr Myriam Jacobson Preston/card tremor  Past Surgical History: Last updated: 11/23/2009 Small Bowel Obstruction Repair with adhesiolysis 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  Married Lives in the home with his wife Alcohol use-no Drug use-no 1 child  disabled veteran - sees [place] every 6 mo, for meds  Risk Factors: Smoking Status: quit (07/30/2009)  Review of Systems       all otherwise negative per pt -    Physical Exam  General:  alert and overweight-appearing.   Head:  normocephalic and  atraumatic.   Eyes:  vision grossly intact, pupils equal, and pupils round.   Ears:  bilat tm's mild erythema at best, sinus nontender Nose:  no external deformity and no nasal discharge.   Mouth:  pharyngeal erythema - mild, no gingival abnormalities Neck:  supple and no masses.   Lungs:  mild increased resp effort,  left chest mild decreased BS more than right;  no frank wheeze or rales Heart:  normal rate and regular rhythm.   Msk:  no joint tenderness and no joint swelling.   Extremities:  no edema, no erythema  Neurologic:  cranial nerves II-XII intact and strength normal in all extremities.     Impression & Recommendations:  Problem # 1:  DYSPNEA/SHORTNESS OF BREATH (ICD-786.09)  His updated medication list for this problem includes:    Spiriva Handihaler 18 Mcg Caps (Tiotropium bromide monohydrate) ..... Inhale contents of 1 cap daily    Diovan Hct 160-12.5 Mg Tabs (Valsartan-hydrochlorothiazide) .[name]... Take 1 tablet by mouth once a day    Proventil Hfa 108 (90 Base) Mcg/act Aers (Albuterol sulfate) .[name]... 2 puffs every 4 hours as needed    Symbicort 160-4.5 Mcg/act Aero (Budesonide-formoterol fumarate) .[name]... 2 puffs first thing  in am and 2 puffs again in pm about 12 hours later    Furosemide 40 Mg Tabs (Furosemide) .[name]... 1 tab daily as directed for increased fluid suspect pna - change zpack to avelox, ok to hold the lasix for now;  to check cxr in am, cont all other meds, to ER for any worsening  Orders: T-2 View CXR, Same Day (71020.5TC)  Problem # 2:  HYPERTENSION (ICD-401.9)  His updated medication list for this problem includes:    Diovan Hct 160-12.5 Mg Tabs (Valsartan-hydrochlorothiazide) .[name]... Take 1 tablet by mouth once a day    Furosemide 40 Mg Tabs (Furosemide) .[name]... 1 tab daily as directed for increased fluid  BP today: 120/72 Prior BP: 140/80 (12/08/2009)  Labs Reviewed: K+: 3.9 (11/23/2009) Creat: :  1.0 (11/23/2009)   Chol: 172 (11/23/2009)   HDL: 34.20  (11/23/2009)   LDL: 107 (11/23/2009)   TG: 154.0 (11/23/2009) stable overall by hx and exam, ok to continue meds/tx as is   Problem # 3:  GAIT DISTURBANCE (ICD-781.2)  for neuro eval per pt request  Orders: Neurology Referral (Neuro)  Complete Medication List: 1)  Spiriva Handihaler 18 Mcg Caps (Tiotropium bromide monohydrate) .... Inhale contents of 1 cap daily 2)  Centrum Silver Tabs (Multiple vitamins-minerals) .[name].. 1 once daily 3)  Gemfibrozil 600 Mg Tabs (Gemfibrozil) .[name].. 1 tab by mouth  every morning and at bedtime 4)  Crestor 20 Mg Tabs (Rosuvastatin calcium) .... 1/2 at bedtime 5)  Levothyroxine Sodium 100 Mcg Tabs (Levothyroxine sodium) .... One daily 6)  Levothyroxine Sodium 112 Mcg Tabs (Levothyroxine sodium) .[name].. 1 by mouth daily 7)  Gabapentin 800 Mg Tabs (Gabapentin) .[name].. 1 three times a day 8)  Diovan Hct 160-12.5 Mg Tabs (Valsartan-hydrochlorothiazide) .... Take 1 tablet by mouth once a day 9)  Requip 1 Mg Tabs (Ropinirole hcl) .... Take 1 tab by mouth at bedtime 10)  Fluoxetine Hcl 20 Mg Tabs (Fluoxetine hcl) .... 2 by mouth daily 11)  Finasteride 5 Mg Tabs (Finasteride) .... Take 1 tab by mouth at bedtime 12)  Flomax 0.4 Mg Caps (Tamsulosin hcl) .... Take 1 tab by mouth at bedtime 13)  Glipizide 10 Mg Tabs (Glipizide) .[name].. 1 by mouth two times a day 14)  Metformin Hcl 1000 Mg Tabs (Metformin hcl) .... One twice daily 15)  Aspirin 81 Mg Tbec (Aspirin) .[name].. 1 by mouth daily 16)  Lantus 100 Unit/ml Soln (Insulin glargine) .... 75 units subcutaneously once daily 17)  Clonazepam 1 Mg Tabs (Clonazepam) .... Take 1 tab by mouth at bedtime 18)  Proventil Hfa 108 (90 Base) Mcg/act Aers (Albuterol sulfate) .... 2 puffs every 4 hours as needed 19)  Mucinex Dm 30-600 Mg Xr12h-tab (Dextromethorphan-guaifenesin) .... Take 1-2 tablets every 12 hours as needed 20)  Zyrtec Allergy 10 Mg Tabs (Cetirizine hcl) .... Take 1 tab by mouth at bedtime as needed 21)  Symbicort 160-4.5 Mcg/act  Aero (Budesonide-formoterol fumarate) .... 2 puffs first thing  in am and 2 puffs again in pm about 12 hours later 22)  Clotrimazole 10 Mg Troc (Clotrimazole) .... One 4 x daily as needed for throat irritation 23)  Home O2 - 2l At Rest, 4l With Exertion  24)  Furosemide 40 Mg Tabs (Furosemide) .[name].. 1 tab daily as directed for increased fluid 25)  Azithromycin 250 Mg Tabs (Azithromycin) .... 2 tabs today, then 1 tab daily for the next 4 days [place])  Avelox 400 Mg Tabs (Moxifloxacin hcl) .[name].. 1po once daily  Patient Instructions: 1)  stop the zpack and lasix fluid pill 2)  start the avelox as prescribed 3)  Continue all previous medications as before this visit 4)  Please go to Radiology in the basement level for your X-Ray tomorrow 5)  You will be contacted about the referral(s) to: neurology 6)  Please schedule a follow-up appointment as needed. Prescriptions: AVELOX 400 MG TABS (MOXIFLOXACIN HCL) 1po once daily  #10 x 0   Entered and Authorized by:   Corwin Levins MD   Signed by:   Corwin Levins MD on 12/10/2009   Method used:   Electronically to        [facility]. #[id number]* (retail)       [address]  [place], [place]  [address]       Ph: [phone]       Fax: [phone]   RxID:   [id number]

## 2010-08-29 NOTE — Progress Notes (Signed)
Summary: LIQUID OXYGEN ORDER OK  Phone Note Call from Patient   Caller: Patient Call For: Shatiqua Heroux Summary of Call: NEED PRESCRIPT FAXED TO [facility] FOR LIQUID OXYGEN Initial call taken by: Rickard Patience,  September 28, 2009 2:55 PM  Follow-up for Phone Call        Please advise if okay to send order to [facility] for liquid o2. Thanks Vernie Murders  September 28, 2009 4:51 PM ok with me Follow-up by: Nyoka Cowden MD,  September 28, 2009 5:45 PM  Additional Follow-up for Phone Call Additional follow up Details #1::        order palced. Carron Curie CMA  October 01, 2009 9:06 AM

## 2010-08-29 NOTE — Progress Notes (Signed)
Summary: RE: RX NAC   Phone Note From Pharmacy   Caller: [facility]  [place]. #[id number]* Call For: RAMASWAMY  Summary of Call: PHARMACY STATES THAT NAC 600mg  CAPS UNAVAILABLE (NO BACK ORDERED BUT UNAVAILABLE). HOWEVER; SAYS [facility] HAS THIS IN LIQUID FORM. PLS CHECK W/ MR RE: THIS. PHARMACY WIL PUT THIS ON FILE. ALSO REQUESTS THAT PT BE CALLED AND ADVISED AS WELL. [facility] [phone] Initial call taken by: [name], CNA,  June 19, 2010 3:06 PM  Follow-up for Phone Call        per 06-18-10 ov note w/ MR, pt was to go to [email or web address] to purchase the NAC as this is not available through local pharmacies.  LMOM TCB x1.  called spoke with [facility] at [facility].  informed him of what the pt is supposed to do.  pharmacist verbalized his understanding.  when pt returns call, will need to reprint rx and have MR to sign.  pt may need to send this to the online pharmacy. [name] CNA/MA  June 19, 2010 3:24 PM   Additional Follow-up for Phone Call Additional follow up Details #1::        spoke with pt and he states he got medication from [facility]. Carron Curie CMA  June 21, 2010 9:47 AM      Appended Document: RE: RX NAC  I told him to order it from website [email or web address] . Please check if he can do that  Appended Document: RE: RX NAC  is the medication he bought from the [facility] store a different medication? he read the bottle to me and it matched the prescription.  Pt didn't want to pay more money if he did not have to.  Please advise.   Appended Document: RE: RX NAC  They are all very similar. I do not know what the subtle differences between brands are. 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 Needs, other antibodies checked esp ssA, ssB, scl-70 and anti ccP  Appended Document: RE: RX NAC  mr spoke with pt see 07-02-10 phone note.

## 2010-08-29 NOTE — Progress Notes (Signed)
Summary: CHANGE DRS > ok with Wert  Phone Note Call from Patient   Caller: Spouse Call For: WERT Summary of Call: PT WANT TO CHANGE DRS . HE WOULD LIKE TO SEE DR ALVA Initial call taken by: Rickard Patience,  May 14, 2010 11:25 AM  Follow-up for Phone Call        spoke to pt and he states not happy with mw and would like to switch to dr Vassie Loll.--dr wert pls advise if ok with you?   Philipp Deputy [facility]  May 14, 2010 11:37 AM  ok with me but needs to see Tammy first to regroup re full and accurate medication reconciliation - without this we cannot provide optimal quality care and let him know I'm sorry that he didn't find my comments constructive or helpful but I really want him to ge the best care possible, if not from me, then that's fine he can see Dr Vassie Loll if Vassie Loll agrees.  Follow-up by: Nyoka Cowden MD,  May 14, 2010 1:29 PM  Additional Follow-up for Phone Call Additional follow up Details #1::        will forward message to RA to get ok or not to switch docs.  Aundra Millet Reynolds LPN  May 14, 2010 1:53 PM     Additional Follow-up for Phone Call Additional follow up Details #2::    I am overbooked for the next few weeks. I do not believe I have seen him before.  Can one of the other partners pick him up ? 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 Follow-up by: Comer Locket Vassie Loll MD,  May 14, 2010 3:42 PM  Additional Follow-up for Phone Call Additional follow up Details #3:: Details for Additional Follow-up Action Taken: Dr. Marchelle Gearing would you be ok for pt to switch to you? Please advise. Carron Curie CMA  May 14, 2010 3:44 PM  I will see him. Kalman Shan MD  May 15, 2010 4:55 PM   Additional Follow-up by: Kalman Shan MD,  May 15, 2010 4:55 PM    Called, spoke with pt. He was informed of above and ok to switch to MR.  Pt already has a med calander scheduled with TP for 11.8.11 at 2:00 and was reminded to keep this  appt.  Consult with MR scheduled for 11.22.11 at 1:50pm -- pt advised to arrive 15 minutes prior to fill out paperwork.  He verbalized understanding.  Gweneth Dimitri RN  May 15, 2010 5:15 PM

## 2010-08-29 NOTE — Assessment & Plan Note (Signed)
Summary: NP follow up - med calendar   Primary [name]/Referring [name]:  Dr. Eula Listen  CC:  est med calendar - pt brought all meds with him today.  no new complaints today.[name]  History of Present Illness: 70  yowm quit smoking around 2000 with GOLD III COPD by PFT's 09/2009  July 31, 2009 cc sob at rest since June 2010 while of ace and high doses of propranonol seen in consutation 07/25/2009 rec no ace, d/c propranolo > reports  no better from hospitalization and on  02 just at night with dry cough but no cp.  Thoroughly confused with instructions, still on ACE though I had strongly rec he stop these.  August 15, 2009--Returns for follow up and med review. Last visit, Ace was stopped (again). He returns today feeling much better. Only using O2 intermittently, "afraid he will get dependent on this", We discussed how O2 works and problems with desaturations. He desats in the upper 70 with activity and at rest he averages 87-90% at rest. He is on mulitple meds. Gets most of meds via [place] system. He does have insurance as well.  September 26, 2009 Followup with PFT's.  Pt states breathing is the same which represents a marked improvement over baseline with no new complaints today. on 4lpm can walk flat fine as long as he paces himself with no sign variability. rec symbicort   February 07, 2010 cc  breathing is "pretty good".   can even push a mower now for 30 min wihout sotpping using 02 4lpm with anything but very slow adls vs a year ago sob at rest.  sleeps ok without resp problems or am exac. rarely needs saba (maybe twice a week) no cough.  >>no changes   PAGE 2      May 07, 2010 --Presents for an acute office visit. Complains coughing up blood for 3 days - Bright red blood - Occas 1 teaspoon in quantity - Occs 3-4 times a day - c/o pain in upper abdomen last week - Has h/o anuerysm surgery 8 yrs ago - SOB is about the same . [place] felt mucus in throat, when he  coughed up, noticed bright red blood -that was 3 days ago. Has had on/off cough w/ blood tinged-bright red mucus mixed w/ blood -both at times -1/2 tsp in size. >RxAvelox  7d, ASA held CT chest /sinus w/ chronic COPD/Fibrotic changes, neg PE   May 13, 2010 Hemoptysis- still has some on occ.  Breathing "a little more shallow".  no longer using med calendar, when showed it to him acknowledged he's not using it the way it's written and only has a pillbox with two slots per day.  on 02 24 hours per day at 2lpm, marked improvement since f/u here in ex tol (off ace)  no epistaxis.  >>no changes  June 04, 2010--Presents for follow up and med review. We reviewed all his meds and organized them into his med calendar.  He has had no further hemoptysis or episataxis. Previous CT chest w/ neg acute changes.  He feels he is back to baseline. Denies chest pain,  orthopnea, hemoptysis, fever, n/v/d, edema, headache, .  Medications Prior to Update: 1)  Spiriva Handihaler 18 Mcg Caps (Tiotropium Bromide Monohydrate) .... Inhale Contents of 1 Cap Daily 2)  Crestor 20 Mg Tabs (Rosuvastatin Calcium) .... 1/2 At Bedtime 3)  Gabapentin 800 Mg Tabs (Gabapentin) .[name].. 1 Three Times A Day  4)  Niacin Cr 500 Mg Cr-Caps (Niacin) .[name].. 1 Tablet Once Daily 5)  Fluoxetine Hcl 20 Mg Tabs (Fluoxetine Hcl) .[name].. 1 Tablet Two Times A Day 6)  Losartan Potassium 100 Mg Tabs (Losartan Potassium) .... 1/2 Once Daily 7)  Glipizide 10 Mg Tabs (Glipizide) .[name].. 1 By Mouth Two Times A Day 8)  Hydrochlorothiazide 25 Mg Tabs (Hydrochlorothiazide) .... 1/2 Once Daily As Needed 9)  Levothyroxine Sodium 200 Mcg Tabs (Levothyroxine Sodium) .[name].. 1 By Mouth Once Daily 10)  Lantus 100 Unit/ml Soln (Insulin Glargine) .... 74 Units Subcutaneously Once Daily 11)  Symbicort 160-4.5 Mcg/act  Aero (Budesonide-Formoterol Fumarate) .... 2 Puffs First Thing  in Am and 2 Puffs Again in Pm About 12 Hours Later 12)  Flomax 0.4 Mg Caps (Tamsulosin Hcl) ....  Take 1 Tab By Mouth At Bedtime 13)  Metformin Hcl 1000 Mg Tabs (Metformin Hcl) .... One Twice Daily 14)  Zolpidem Tartrate 10 Mg Tabs (Zolpidem Tartrate) .[name].. 1 At Bedtime As Needed 15)  Vitamin D3 2000 Unit Caps (Cholecalciferol) .[name].. 1 Once Daily 16)  Requip 1 Mg Tabs (Ropinirole Hcl) .... Take 1 Tab By Mouth At Bedtime 17)  O2  - 2l At Rest, 4l With Exertion 18)  Proventil Hfa 108 (90 Base) Mcg/act Aers (Albuterol Sulfate) .... 2 Puffs Every 4 Hours As Needed 19)  Centrum Silver  Tabs (Multiple Vitamins-Minerals) .[name].. 1 Once Daily 20)  Mucinex Dm 30-600 Mg Xr12h-Tab (Dextromethorphan-Guaifenesin) .... Take 1-2 Tablets Every 12 Hours As Needed 21)  Clotrimazole 10 Mg Troc (Clotrimazole) .... One 4 X Daily As Needed For Throat Irritation  Current Medications (verified): 1)  Spiriva Handihaler 18 Mcg Caps (Tiotropium Bromide Monohydrate) .... Inhale Contents of 1 Cap Daily 2)  Foradil Aerolizer 12 Mcg Caps (Formoterol Fumarate) .... Inhale Contents of 1 Capsule Two Times A Day 3)  Asmanex 60 Metered Doses 220 Mcg/inh Aepb (Mometasone Furoate) .... Inhale 2 Puffs By Mouth At Bedtime 4)  Simvastatin 20 Mg Tabs (Simvastatin) .... 1/4 By Mouth Once Daily 5)  Slo-Niacin 500 Mg Cr-Tabs (Niacin) .... Take 1 Tab By Mouth At Bedtime 6)  Gabapentin 800 Mg Tabs (Gabapentin) .[name].. 1 Tab By Mouth  Every Morning and At Bedtime 7)  Losartan Potassium 100 Mg Tabs (Losartan Potassium) .... 1/2 Once Daily 8)  Hydrochlorothiazide 25 Mg Tabs (Hydrochlorothiazide) .... 1/2 Tab By Mouth Once Daily 9)  Levothyroxine Sodium 200 Mcg Tabs (Levothyroxine Sodium) .[name].. 1 Tab By Mouth  Every Morning and At Bedtime 10)  Doxepin Hcl 50 Mg Caps (Doxepin Hcl) .... Take 1 Capsule By Mouth At Bedtime 11)  Doxepin Hcl 10 Mg Caps (Doxepin Hcl) .[name].. 1 Capsule By Mouth  Every Morning and in The Evening 12)  Flomax 0.4 Mg Caps (Tamsulosin Hcl) .... Take 1 Tab By Mouth At Bedtime 13)  Aspirin 81 Mg Tbec (Aspirin) .... Take 1 Tablet By  Mouth Once A Day 14)  Metformin Hcl 1000 Mg Tabs (Metformin Hcl) .... One Twice Daily 15)  Lantus 100 Unit/ml Soln (Insulin Glargine) .... Sliding Scale With Each Meal 16)  Lantus 100 Unit/ml Soln (Insulin Glargine) .... 45 Units Subcutaneously Once Daily With Dinner 17)  Mega Multi Men  Cr-Tabs (Multiple Vitamins-Minerals) .... Take 1 Tablet By Mouth Once A Day 18)  Vitamin D3 2000 Unit Caps (Cholecalciferol) .[name].. 1 Once Daily 19)  Requip 1 Mg Tabs (Ropinirole Hcl) .... Take 1 Tab By Mouth At Bedtime 20)  Finasteride 5 Mg Tabs (Finasteride) .... Take 1 Tablet By Mouth  Once A Day 21)  Oxygen .... Wear 2l/min Continuously 22)  Proair Hfa 108 (90 Base) Mcg/act Aers (Albuterol Sulfate) .... 2 Puffs Every 4 Hours As Needed 23)  Guaifenesin 400 Mg Tabs (Guaifenesin) .... 2tabs By Mouth Two Times A Day As Needed 24)  Zyrtec Allergy 10 Mg Tabs (Cetirizine Hcl) .... Take 1 Tab By Mouth At Bedtime As Needed 25)  Oxygen .... Increase To 4l/min With Activity As Needed 26)  Prednisone 10 Mg Tabs (Prednisone) .... 3po Qd For 3days, Then 2po Qd For 3days, Then 1po Qd For 3days, Then Stop 27)  Oxycodone Hcl 5 Mg Caps (Oxycodone Hcl) .[name].. 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  Married Lives in the home with his wife Alcohol use-no Drug use-no 1 child  disabled veteran - sees [place] every 6 mo, for meds  Risk Factors: Smoking  Status: quit (07/30/2009)  Past Medical History: HYPOTHYROIDISM (ICD-244.9)    - TSH  .276 12/209/10 HYPERTENSION (ICD-401.9) C O P D (ICD-496)...........................................................[name] Dr Sherene Sires      - HFA 50% July 31, 2009 >   90% September 26, 2009 > 90% February 07, 2010       - PFT's September 26, 2009 FEV1 1.27 (38%) ratio 36 with 30% improvement  after B2, DLC0 35 corrects to 66       -CT sinus neg.  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 parkinsons complex med regimen--Meds reviewed with pt education and computerized med calendar 06/04/10  Review of Systems      See HPI  Vital Signs:  Patient profile:   70 year old male Height:      74.5 inches Weight:      252.38 pounds BMI:     32.09 O2 Sat:      90 % on 2 L/min cont Temp:     98.3 degrees F oral Pulse rate:   84 / minute BP sitting:   120 / 60  (left arm) Cuff size:   large  Vitals Entered By: [name] CNA/MA (June 04, 2010 2:05 PM)  O2 Flow:  2 L/min cont CC: est med calendar - pt brought all meds with him today.  no new complaints today. Is Patient Diabetic? No Comments Medications reviewed with patient Daytime contact number verified with patient. [name] CNA/MA  June 04, 2010 2:07 PM    Physical Exam  Additional Exam:  obese amb minimally  hoarse wm nad on 02 with minimal lip tremor wt  254  September 26, 2009 > 257 November 06, 2009 > 249 February 07, 2010 >>245 05/07/10 > 244 May 13, 2010 >>252 June 04, 2010  HEENT mild turbinate edema.  partial top denture, full bottom dentureOropharynx pos thrush or excess pnd or cobblestoning.  No JVD or cervical adenopathy. Mild accessory muscle hypertrophy. Trachea midline, nl thryroid. Chest was  hyperinflated by percussion with diminished breath sounds and moderate increased exp time without wheeze. Hoover sign positive at mid inspiration. Regular rate and rhythm without murmur gallop or rub or increase P2 or edema.  Abd: no hsm, nl excursion. Ext warm without cyanosis or clubbing.     Impression & Recommendations:  Problem # 1:  HEMOPTYSIS UNSPECIFIED (ICD-786.30) Now resolved.  The following medications were removed from the medication list:    Centrum Silver Tabs (Multiple vitamins-minerals) .[name]... 1 once daily His updated medication list for this problem includes:    Spiriva Handihaler 18 Mcg Caps (Tiotropium bromide monohydrate) ..... Inhale contents of 1 cap daily    Foradil Aerolizer 12 Mcg Caps (Formoterol fumarate) ..... Inhale contents of 1 capsule two times a day    Asmanex 60 Metered Doses 220 Mcg/inh Aepb (Mometasone furoate) ..... Inhale 2 puffs by mouth at bedtime    Slo-niacin 500 Mg Cr-tabs (Niacin) .[name]... Take 1 tab by mouth at bedtime    Mega Multi Men Cr-tabs (Multiple vitamins-minerals) .[name]... Take 1 tablet by mouth once a day    Vitamin D3 2000 Unit Caps (Cholecalciferol) .[name]... 1 once daily    Proair Hfa 108 (90 Base) Mcg/act Aers (Albuterol sulfate) .[name]... 2 puffs every 4 hours as needed    Prednisone 10 Mg Tabs (Prednisone) .[name]... 3po qd for 3days, then 2po qd for 3days, then 1po qd for 3days, then stop  Problem # 2:  C O P D (ICD-496) Recent flare now resolved Meds reviewed with pt education and computerized med calendar completed/adjusted.   follow up Dr. Marchelle Gearing as planned in 2 weeks and as needed   Medications Added to Medication List This Visit: 1)  Foradil Aerolizer 12 Mcg Caps (Formoterol fumarate) .... Inhale contents of 1 capsule two times a day 2)  Asmanex 60 Metered Doses 220 Mcg/inh Aepb (Mometasone furoate) .... Inhale 2 puffs by mouth at bedtime 3)  Slo-niacin 500 Mg Cr-tabs (Niacin) .... Take 1 tab by mouth at bedtime 4)  Gabapentin 800 Mg Tabs  (Gabapentin) .[name].. 1 tab by mouth  every morning and at bedtime 5)  Hydrochlorothiazide 25 Mg Tabs (Hydrochlorothiazide) .... 1/2 tab by mouth once daily 6)  Levothyroxine Sodium 200 Mcg Tabs (Levothyroxine sodium) .[name].. 1 tab by mouth  every morning and at bedtime 7)  Doxepin Hcl 50 Mg Caps (Doxepin hcl) .... Take 1 capsule by mouth at bedtime 8)  Doxepin Hcl 10 Mg Caps (Doxepin hcl) .[name].. 1 capsule by mouth  every morning and in the evening 9)  Aspirin 81 Mg Tbec (Aspirin) .... Take 1 tablet by mouth once a day 10)  Lantus 100 Unit/ml Soln (Insulin glargine) 11)  Lantus 100 Unit/ml Soln (Insulin glargine) .... Sliding scale with each meal 12)  Lantus 100 Unit/ml Soln (Insulin glargine) .... 45 units subcutaneously once daily with dinner 13)  Mega Multi Men Cr-tabs (Multiple vitamins-minerals) .... Take 1 tablet by mouth once a day 14)  Finasteride 5 Mg Tabs (Finasteride) .... Take 1 tablet by mouth once a day 15)  Oxygen  .... Wear 2l/min continuously 16)  Proair Hfa 108 (90 Base) Mcg/act Aers (Albuterol sulfate) .... 2 puffs every 4 hours as needed 17)  Guaifenesin 400 Mg Tabs (Guaifenesin) .... 2tabs by mouth two times a day as needed 18)  Zyrtec Allergy 10  Mg Tabs (Cetirizine hcl) .... Take 1 tab by mouth at bedtime as needed 19)  Oxygen  .... Increase to 4l/min with activity as needed  Complete Medication List: 1)  Spiriva Handihaler 18 Mcg Caps (Tiotropium bromide monohydrate) .... Inhale contents of 1 cap daily 2)  Foradil Aerolizer 12 Mcg Caps (Formoterol fumarate) .... Inhale contents of 1 capsule two times a day 3)  Asmanex 60 Metered Doses 220 Mcg/inh Aepb (Mometasone furoate) .... Inhale 2 puffs by mouth at bedtime 4)  Simvastatin 20 Mg Tabs (Simvastatin) .... 1/4 by mouth once daily 5)  Slo-niacin 500 Mg Cr-tabs (Niacin) .... Take 1 tab by mouth at bedtime 6)  Gabapentin 800 Mg Tabs (Gabapentin) .[name].. 1 tab by mouth  every morning and at bedtime 7)  Losartan Potassium 100 Mg Tabs  (Losartan potassium) .... 1/2 once daily 8)  Hydrochlorothiazide 25 Mg Tabs (Hydrochlorothiazide) .... 1/2 tab by mouth once daily 9)  Levothyroxine Sodium 200 Mcg Tabs (Levothyroxine sodium) .[name].. 1 tab by mouth  every morning and at bedtime 10)  Doxepin Hcl 50 Mg Caps (Doxepin hcl) .... Take 1 capsule by mouth at bedtime 11)  Doxepin Hcl 10 Mg Caps (Doxepin hcl) .[name].. 1 capsule by mouth  every morning and in the evening 12)  Flomax 0.4 Mg Caps (Tamsulosin hcl) .... Take 1 tab by mouth at bedtime 13)  Aspirin 81 Mg Tbec (Aspirin) .... Take 1 tablet by mouth once a day 14)  Metformin Hcl 1000 Mg Tabs (Metformin hcl) .... One twice daily 15)  Lantus 100 Unit/ml Soln (Insulin glargine) .... Sliding scale with each meal 16)  Lantus 100 Unit/ml Soln (Insulin glargine) .... 45 units subcutaneously once daily with dinner 17)  Mega Multi Men Cr-tabs (Multiple vitamins-minerals) .... Take 1 tablet by mouth once a day 18)  Vitamin D3 2000 Unit Caps (Cholecalciferol) .[name].. 1 once daily 19)  Requip 1 Mg Tabs (Ropinirole hcl) .... Take 1 tab by mouth at bedtime 20)  Finasteride 5 Mg Tabs (Finasteride) .... Take 1 tablet by mouth once a day 21)  Oxygen  .... Wear 2l/min continuously 22)  Proair Hfa 108 (90 Base) Mcg/act Aers (Albuterol sulfate) .... 2 puffs every 4 hours as needed 23)  Guaifenesin 400 Mg Tabs (Guaifenesin) .... 2tabs by mouth two times a day as needed 24)  Zyrtec Allergy 10 Mg Tabs (Cetirizine hcl) .... Take 1 tab by mouth at bedtime as needed 25)  Oxygen  .... Increase to 4l/min with activity as needed 26)  Prednisone 10 Mg Tabs (Prednisone) .... 3po qd for 3days, then 2po qd for 3days, then 1po qd for 3days, then stop 27)  Oxycodone Hcl 5 Mg Caps (Oxycodone hcl) .[name].. 1 - 2 by mouth q 6 hrs as needed  Patient Instructions: 1)  Continue on same meds.  2)  Follow med calendar closely and bring to each  visit.  3)  follow up Dr. Marchelle Gearing in 2 weeks as planned.    Immunization  History:  Influenza Immunization History:    Influenza:  historical (04/27/2010)

## 2010-08-29 NOTE — Consult Note (Signed)
Summary: [facility]  [facility]   Imported By: Lester Benton 01/25/2010 09:39:58  _____________________________________________________________________  External Attachment:    Type:   Image     Comment:   External Document

## 2010-08-29 NOTE — Progress Notes (Signed)
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 HAve advised additional autoimmne profile. He agreed. He mentioed that past 2 days increased dyspnea, cough and increased white sputumvolume c/w AECOPD. Sputum not colored yet but clear. Also, some increase in edema but no fever, chest pain, wheeze, hemoptysis.  Will Rx him for AECOIPD with doxy and pred. Advised calling us or going to ER if worse Initial call taken by: Kalman Shan MD,  July 02, 2010 5:26 PM    New/Updated Medications: DOXYCYCLINE MONOHYDRATE 100 MG  CAPS (DOXYCYCLINE MONOHYDRATE) By mouth twice daily after meals PREDNISONE 10 MG  TABS (PREDNISONE) Take 4 tablets daily x3 days, then 3 tablets daily x 3 days, then 2 tablets daily x3 days, then 1 tablet daily x3 days, then stop Prescriptions: PREDNISONE 10 MG  TABS (PREDNISONE) Take 4 tablets daily x3 days, then 3 tablets daily x 3 days, then 2 tablets daily x3 days, then 1 tablet daily x3 days, then stop  #30 x 0   Entered and Authorized by:   Kalman Shan MD   Signed by:   Kalman Shan MD on 07/02/2010   Method used:   Electronically to        [facility]. [phone]* (retail)       [address]       [place], [place]  [address]       Ph: [phone]       Fax: [phone]   RxID:   [id number] DOXYCYCLINE MONOHYDRATE 100 MG  CAPS (DOXYCYCLINE MONOHYDRATE) By mouth twice daily after meals  #10 x 0   Entered and Authorized by:   Kalman Shan MD   Signed by:   Kalman Shan MD on 07/02/2010   Method used:   Electronically to        [facility]. [phone]* (retail)       [address]       [place], [place]  [address]       Ph: [phone]       Fax: [phone]   RxID:   [id number]

## 2010-08-29 NOTE — Progress Notes (Signed)
Summary: nos appt  Phone Note Call from Patient   Caller: [email or web address]  Call For: parrett Summary of Call: In ref to nos from 10/18, pt has an appt on 11/8. Initial call taken by: Darletta Moll,  May 15, 2010 8:57 AM

## 2010-08-29 NOTE — Letter (Signed)
Summary: Primary Care Appointment Letter  [facility]  [address] [place], [place] [address]   Phone: [phone]  Fax: [phone]    08/21/2010 MRN: [id number]  [facility] [address] [place], [place]  [address]  Dear [name],   Your Primary Care Physician Dakota Levins MD has indicated that:    _______it is time to schedule an appointment.    _______you missed your appointment on______ and need to call and          reschedule.    _______you need to have lab work done.    _______you need to schedule an appointment discuss lab or test results.    ___X____you need to call to reschedule your appointment that is                       scheduled on Dec 05, 2010 with Dr. Jonny Ruiz for a follow-up.  Please call the office.     Please call our office as soon as possible. Our phone number is [phone]. Please press option 1. Our office is open 8a-12noon and 1p-5p, Monday through Friday.     Thank you,    [facility]

## 2010-08-29 NOTE — Assessment & Plan Note (Signed)
Summary: Pulmonary/ ext f/u ov for med reconciliation   Primary Provider/Referring Provider:  Dr. Eula Listen  CC:  Hemoptysis- still has some on occ.  Breathing "a little more shallow".  .  History of Present Illness: 70  yowm quit smoking around 2000 with GOLD III COPD by PFT's 09/2009  July 31, 2009 cc sob at rest since June 2010 while of ace and high doses of propranonol seen in consutation 07/25/2009 rec no ace, d/c propranolo > reports  no better from hospitalization and on  02 just at night with dry cough but no cp.  Thoroughly confused with instructions, still on ACE though I had strongly rec he stop these.  August 15, 2009--Returns for follow up and med review. Last visit, Ace was stopped (again). He returns today feeling much better. Only using O2 intermittently, "afraid he will get dependent on this", We discussed how O2 works and problems with desaturations. He desats in the upper 70 with activity and at rest he averages 87-90% at rest. He is on mulitple meds. Gets most of meds via [place] system. He does have insurance as well.  September 26, 2009 Followup with PFT's.  Pt states breathing is the same which represents a marked improvement over baseline with no new complaints today. on 4lpm can walk flat fine as long as he paces himself with no sign variability. rec symbicort   February 07, 2010 cc  breathing is "pretty good".   can even push a mower now for 30 min wihout sotpping using 02 4lpm with anything but very slow adls vs a year ago sob at rest.  sleeps ok without resp problems or am exac. rarely needs saba (maybe twice a week) no cough.  >>no changes   PAGE 2      May 07, 2010 --Presents for an acute office visit. Complains coughing up blood for 3 days - Bright red blood - Occas 1 teaspoon in quantity - Occs 3-4 times a day - c/o pain in upper abdomen last week - Has h/o anuerysm surgery 8 yrs ago - SOB is about the same . [place] felt mucus in throat,  when he coughed up, noticed bright red blood -that was 3 days ago. Has had on/off cough w/ blood tinged-bright red mucus mixed w/ blood -both at times -1/2 tsp in size.    Avelox 400mg  once daily for 7 days Mucinex DM two times a day as needed  No aspirin for now.   May 13, 2010 Hemoptysis- still has some on occ.  Breathing "a little more shallow".  no longer using med calendar, when showed it to him acknowledged he's not using it the way it's written and only has a pillbox with two slots per day.  on 02 24 hours per day at 2lpm, marked improvement since f/u here in ex tol (off ace)  no epistaxis.   Pt denies any significant sore throat, dysphagia, itching, sneezing,  nasal congestion or excess secretions,  fever, chills, sweats, unintended wt loss, pleuritic or exertional cp, hempoptysis, change in activity tolerance  orthopnea pnd or leg swelling    Current Medications (verified): 1)  Spiriva Handihaler 18 Mcg Caps (Tiotropium Bromide Monohydrate) .... Inhale Contents of 1 Cap Daily 2)  Centrum Silver  Tabs (Multiple Vitamins-Minerals) .[name].. 1 Once Daily 3)  Crestor 20 Mg Tabs (Rosuvastatin Calcium) .... 1/2 At Bedtime 4)  Gabapentin 800 Mg Tabs (Gabapentin) .[name].. 1 Three Times A Day 5)  Losartan Potassium 100 Mg Tabs (Losartan Potassium) .... 1/2 Once Daily 6)  Requip 1 Mg Tabs (Ropinirole Hcl) .... Take 1 Tab By Mouth At Bedtime 7)  Fluoxetine Hcl 20 Mg Tabs (Fluoxetine Hcl) .[name].. 1 Tablet Two Times A Day 8)  Flomax 0.4 Mg Caps (Tamsulosin Hcl) .... Take 1 Tab By Mouth At Bedtime 9)  Glipizide 10 Mg Tabs (Glipizide) .[name].. 1 By Mouth Two Times A Day 10)  Metformin Hcl 1000 Mg Tabs (Metformin Hcl) .... One Twice Daily 11)  Lantus 100 Unit/ml Soln (Insulin Glargine) .... 74 Units Subcutaneously Once Daily 12)  Proventil Hfa 108 (90 Base) Mcg/act Aers (Albuterol Sulfate) .... 2 Puffs Every 4 Hours As Needed 13)  Mucinex Dm 30-600 Mg Xr12h-Tab (Dextromethorphan-Guaifenesin) .... Take 1-2 Tablets  Every 12 Hours As Needed 14)  Symbicort 160-4.5 Mcg/act  Aero (Budesonide-Formoterol Fumarate) .... 2 Puffs First Thing  in Am and 2 Puffs Again in Pm About 12 Hours Later 15)  Clotrimazole 10 Mg Troc (Clotrimazole) .... One 4 X Daily As Needed For Throat Irritation 16)  Home O2  - 2l At Rest, 4l With Exertion 17)  Hydrochlorothiazide 25 Mg Tabs (Hydrochlorothiazide) .... 1/2 Once Daily As Needed 18)  Vitamin D3 2000 Unit Caps (Cholecalciferol) .[name].. 1 Once Daily 19)  Zolpidem Tartrate 10 Mg Tabs (Zolpidem Tartrate) .[name].. 1 At Bedtime As Needed 20)  Niacin Cr 500 Mg Cr-Caps (Niacin) .[name].. 1 Tablet Once Daily 21)  Levothyroxine Sodium 200 Mcg Tabs (Levothyroxine Sodium) .[name].. 1 By Mouth Once Daily  Allergies (verified): No Known Drug Allergies  Past History:  Past Medical History: HYPOTHYROIDISM (ICD-244.9)    - TSH  .276 12/209/10 HYPERTENSION (ICD-401.9) C O P D (ICD-496)...........................................................[name] Dr Sherene Sires      - HFA 50% July 31, 2009 >   90% September 26, 2009 > 90% February 07, 2010       - PFT's September 26, 2009 FEV1 1.27 (38%) ratio 36 with 30% improvement  after B2, DLC0 35 corrects to 66 Right Pleural Calcified plaques s/p remote pna     - CT Chest 05/31/2010 > no change 05/08/10 Diabetes mellitus, type II Benign prostatic hypertrophy DJD Depression Coronary artery disease - non-obstructive by cath juen 2003 - Dr Myriam Jacobson [place]/card tremor gait disturbance - Dr Terrace Arabia - ? parkinsons  Vital Signs:  Patient profile:   70 year old male Weight:      144 pounds O2 Sat:      93 % on 2 L/min Temp:     98.3 degrees F oral Pulse rate:   70 / minute BP sitting:   100 / 60  (left arm) Cuff size:   large  Vitals Entered By: Vernie Murders (May 13, 2010 9:43 AM)  O2 Flow:  2 L/min  Physical Exam  Additional Exam:  obese amb minimally  hoarse wm nad on 02 with minimal lip tremor wt  254  September 26, 2009 > 257 November 06, 2009 > 249 February 07, 2010 >>245  05/07/10 > 244 May 13, 2010  HEENT mild turbinate edema.  partial top denture, full bottom dentureOropharynx pos thrush or excess pnd or cobblestoning.  No JVD or cervical adenopathy. Mild accessory muscle hypertrophy. Trachea midline, nl thryroid. Chest was hyperinflated by percussion with diminished breath sounds and moderate increased exp time without wheeze. Hoover sign positive at mid inspiration. Regular rate and rhythm without murmur gallop or rub or increase P2 or edema.  Abd: no hsm, nl excursion. Ext  warm without cyanosis or clubbing.     Impression & Recommendations:  Problem # 1:  HEMOPTYSIS UNSPECIFIED (ICD-786.30) Reviewed ct of sinus and chest, no def source, rx as aecopd with avelox  Problem # 2:  C O P D (ICD-496) GOLD III with ? recent aecopd  DDX of  difficult airways managment all start with A and  include Adherence, Ace Inhibitors, Acid Reflux, Active Sinus Disease, Alpha 1 Antitripsin deficiency, Anxiety masquerading as Airways dz,  ABPA,  allergy(esp in young), Aspiration (esp in elderly), Adverse effects of DPI,  Active smokers, plus one B  = Beta blocker use.[name]   ?Adherence I had an extended discussion with the patient today lasting 15 to 20 minutes of a 25 minute visit on the following issues:  not keeping up with meds as I had hoped with med calendar with multiple sources of care including va and freq substitutions/ modifications. also has pill box that doesn't correlate with the calendar.   To keep things simple, I have asked the patient to first separate medicines that are perceived as maintenance, that is to be taken daily "no matter what", from those medicines that are taken on only on an as-needed basis and I have given the patient examples of both, and then return to see our NP to generate a new updated and  detailed  medication calendar which should be followed until the next physician sees the patient and updates it.   Once we're sure that we're all reading from  the same page in terms of medication admiistration, she needs to be scheduled to follow up with me   Problem # 3:  RESPIRATORY FAILURE, CHRONIC (ICD-518.83) well compensated on present rx  Medications Added to Medication List This Visit: 1)  O2 - 2l At Rest, 4l With Exertion   Other Orders: Est. Patient Level IV (16109)  Patient Instructions: 1)  See Tammy NP w/in 2 weeks with all your medications, even over the counter meds, separated in two separate bags, the ones you take no matter what vs the ones you stop once you feel better and take only as needed.  She will generate for you a new user friendly medication calendar that will put [place] all on the same page re: your medication use.

## 2010-08-29 NOTE — Progress Notes (Signed)
Summary: ADMIT?   Phone Note Call from Patient Call back at [facility] Phone [phone]   Summary of Call: Per pt, avelox has not "worked" and he feels he may need hosptial admission per discussion w/MD at last office visit. Please advise.  Initial call taken by: Lamar Sprinkles, CMA,  Dec 17, 2009 11:15 AM  Follow-up for Phone Call        OK to go to ER for eval for dyspnea if not improving as this will be best way to expedite Follow-up by: Corwin Levins MD,  Dec 17, 2009 12:28 PM  Additional Follow-up for Phone Call Additional follow up Details #1::        pt informed and will go to the nearest ER Additional Follow-up by: Margaret Pyle, CMA,  Dec 17, 2009 1:01 PM

## 2010-08-29 NOTE — Progress Notes (Signed)
Summary: OXYGEN  Phone Note Call from Patient   Caller: Patient Call For: [facility]ERT Summary of Call: NEED TO DISCUSS OXYGEN WITH NURSE  Initial call taken by: Rickard Patience,  September 05, 2009 8:48 AM  Follow-up for Phone Call        Called, spoke with pt.  Pt states he was told by MW to increase o2 to 4L with activity however [facility] does not have an order for this.  Requesting for [place] to send [facility] an order for this. Per MW's last ov note on 07/31/09, pt is to increase o2 to 4L with exercise.  Order send to [facility].  Pt aware order has been sent to [facility].  will sign off on note. Follow-up by: Gweneth Dimitri RN,  September 05, 2009 10:08 AM

## 2010-08-29 NOTE — Progress Notes (Signed)
Summary: order for liquid o2 was sent  Phone Note Call from Patient Call back at Home Phone [phone]   Caller: Patient Call For: wert Summary of Call: States [facility] needs more info in order to process his order for liquid O2, pls advise.  Initial call taken by: Darletta Moll,  April 04, 2010 10:05 AM  Follow-up for Phone Call        [facility] Vernie Murders  April 04, 2010 1:53 PM  pt returned call to [place].  Was out of house when you called.  Please call him back.  Follow-up by: Eugene Gavia,  April 04, 2010 3:19 PM  Additional Follow-up for Phone Call Additional follow up Details #1::        Spoke with pt.  He states that [place] states that they never got the order for liquid o2.  I will place order to [facility].  Pt aware. Additional Follow-up by: Vernie Murders,  April 04, 2010 3:34 PM

## 2010-08-29 NOTE — Assessment & Plan Note (Signed)
Summary: Pulmonary/ ext ov with hfa teaching   Primary Provider/Referring Provider:  Dr. Eula Listen  CC:  Followup with PFT's.  Pt states breathing is the same- no new complaints today.[name]  History of Present Illness: 70 yowm quit smoking around 2000   July 31, 2009 cc sob at rest since June 2010 while of ace and high doses of propranonol seen in consutation 07/25/2009 rec no ace, d/c propranolo > reports  no better from hospitalization and on  02 just at night with dry cough but no cp.  Thoroughly confused with instructions, still on ACE though I had strongly rec he stop these.  August 15, 2009--Returns for follow up and med review. Last visit, Ace was stopped (again). He returns today feeling much better. Only using O2 intermittently, "afraid he will get dependent on this", We discussed how O2 works and problems with desaturations. He desats in the upper 70 with activity and at rest he averages 87-90% at rest. He is on mulitple meds. Gets most of meds via [place] system. He does have insurance as well  September 26, 2009 Followup with PFT's.  Pt states breathing is the same which represents a marked improvement over baseline with no new complaints today. on 4lpm can walk flat fine as long as he paces himself with no sign variability. Pt denies any significant sore throat, dysphagia, itching, sneezing,  nasal congestion or excess secretions,  fever, chills, sweats, unintended wt loss, pleuritic or exertional cp, hempoptysis, change in activity tolerance  orthopnea pnd or leg swelling Pt also denies any obvious fluctuation in symptoms with weather or environmental change or other alleviating or aggravating factors.     Pt denies any increase in rescue therapy over baseline, denies waking up needing it or having early am exacerbations of coughing/wheezing/ or dyspnea     Current Medications (verified): 1)  Spiriva Handihaler 18 Mcg Caps (Tiotropium Bromide Monohydrate) .... Inhale Contents of 1 Cap  Daily 2)  Centrum Silver  Tabs (Multiple Vitamins-Minerals) .[name].. 1 Once Daily 3)  Gemfibrozil 600 Mg Tabs (Gemfibrozil) .[name].. 1 Tab By Mouth  Every Morning and At Bedtime 4)  Crestor 20 Mg Tabs (Rosuvastatin Calcium) .... 1/2 At Bedtime 5)  Levothyroxine Sodium 100 Mcg Tabs (Levothyroxine Sodium) .... One Daily 6)  Levothyroxine Sodium 112 Mcg Tabs (Levothyroxine Sodium) .[name].. 1 By Mouth Daily 7)  Gabapentin 800 Mg Tabs (Gabapentin) .[name].. 1 Three Times A Day 8)  Diovan Hct 160-12.5 Mg Tabs (Valsartan-Hydrochlorothiazide) .... Take 1 Tablet By Mouth Once A Day 9)  Requip 1 Mg Tabs (Ropinirole Hcl) .... Take 1 Tab By Mouth At Bedtime 10)  Fluoxetine Hcl 20 Mg Tabs (Fluoxetine Hcl) .[name].. 1 By Mouth Daily 11)  Finasteride 5 Mg Tabs (Finasteride) .... Take 1 Tab By Mouth At Bedtime 12)  Flomax 0.4 Mg Caps (Tamsulosin Hcl) .... Take 1 Tab By Mouth At Bedtime 13)  Glipizide 10 Mg Tabs (Glipizide) .[name].. 1 By Mouth Two Times A Day 14)  Metformin Hcl 1000 Mg Tabs (Metformin Hcl) .... One Twice Daily 15)  Aspirin 81 Mg Tbec (Aspirin) .[name].. 1 By Mouth Daily 16)  Lantus 100 Unit/ml Soln (Insulin Glargine) .... As Directed 17)  Clonazepam 1 Mg Tabs (Clonazepam) .... Take 1 Tab By Mouth At Bedtime 18)  Proventil Hfa 108 (90 Base) Mcg/act Aers (Albuterol Sulfate) .... 2 Puffs Every 4 Hours As Needed 19)  Mucinex Dm 30-600 Mg Xr12h-Tab (Dextromethorphan-Guaifenesin) .... Take 1-2 Tablets Every 12 Hours As Needed 20)  Zyrtec  Allergy 10 Mg Tabs (Cetirizine Hcl) .... Take 1 Tab By Mouth At Bedtime As Needed  Allergies (verified): No Known Drug Allergies  Past History:  Past Medical History: HYPOTHYROIDISM (ICD-244.9)    - TSH  .276 12/209/10 HYPERTENSION (ICD-401.9) C O P D (ICD-496)      - HFA 50% July 31, 2009 > 69% September 26, 2009       - PFT's September 26, 2009 FEV1 1.27 (38%) ratio 36 with 30% improvemtn after B2, DLC0 35 corrects to 66 Right Pleural Calcified plaques s/p remote pna     - CT Chest  05/31/2010 AODM  Vital Signs:  Patient profile:   70 year old male Weight:      254 pounds O2 Sat:      90 % on 2 L/min Temp:     97.7 degrees F oral Pulse rate:   67 / minute BP sitting:   112 / 66  (left arm) Cuff size:   large  Vitals Entered By: Vernie Murders (September 26, 2009 10:08 AM)  O2 Flow:  2 L/min  Physical Exam  Additional Exam:  obese amb wm nad on 02 wt 250 > 254  September 26, 2009  HEENT mild turbinate edema.  Oropharynx no thrush or excess pnd or cobblestoning.  No JVD or cervical adenopathy. Mild accessory muscle hypertrophy. Trachea midline, nl thryroid. Chest was hyperinflated by percussion with diminished breath sounds and moderate increased exp time without wheeze. Hoover sign positive at mid inspiration. Regular rate and rhythm without murmur gallop or rub or increase P2 or edema.  Abd: no hsm, nl excursion. Ext warm without cyanosis or clubbing.     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  I spent extra time with the patient today explaining optimal mdi  technique.  This improved from  50-90%  Therefore try symbicort 160 2 puffs first thing  in am and 2 puffs again in pm about 12 hours later    Each maintenance medication was reviewed in detail including most importantly the difference between maintenance and as needed and under what circumstances the prns are to be used. This was done in the context of a medication calendar review which provided the patient with a user-friendly unambiguous mechanism for medication administration and reconciliation and provides an action plan for all active problems. It is critical that this be shown to every doctor  for modification during the office visit if necessary so the patient can use it as a working document.   Problem # 2:  RESPIRATORY FAILURE, CHRONIC (ICD-518.83) well  compensated on present rx for 02 24 hours per day, reviewed  Orders: Est. Patient Level IV (16109)  Medications Added to Medication List This Visit: 1)  Crestor 20 Mg Tabs (Rosuvastatin calcium) .... 1/2 at bedtime 2)  Lantus 100 Unit/ml Soln (Insulin glargine) .... As directed 3)  Symbicort 160-4.5 Mcg/act Aero (Budesonide-formoterol fumarate) .... 2 puffs first thing  in am and 2 puffs again in pm about 12 hours later  Patient Instructions: 1)  Symbicort 160 2 puffs first thing  in am and 2 puffs again in pm about 12 hours later  2)  Work on perfecting  inhaler technique:  relax and blow all the way out then take a nice smooth deep breath back in, triggering  the inhaler at same time you start breathing in and hold for at least a few seconds 3)  See calendar for specific medication instructions and bring it back for each and every office visit for every healthcare provider you see.  Without it,  you may not receive the best quality medical care that we feel you deserve. Prescriptions: SYMBICORT 160-4.5 MCG/ACT  AERO (BUDESONIDE-FORMOTEROL FUMARATE) 2 puffs first thing  in am and 2 puffs again in pm about 12 hours later  #1 x 11   Entered and Authorized by:   Nyoka Cowden MD   Signed by:   Nyoka Cowden MD on 09/26/2009   Method used:   Electronically to        [facility]. [phone]* (retail)       [address]       [place], [place]  [address]       Ph: [phone]       Fax: [phone]   RxID:   [id number]

## 2010-08-29 NOTE — Assessment & Plan Note (Signed)
Summary: cough/sob/SD   Vital Signs:  Patient profile:   70 year old male Height:      74.5 inches Weight:      249.50 pounds BMI:     31.72 O2 Sat:      89 % on Room air Temp:     98.5 degrees F oral Pulse rate:   77 / minute BP sitting:   126 / 64  (left arm) Cuff size:   large  Vitals Entered By: Zella Ball Ewing CMA Duncan Dull) (March 01, 2010 2:17 PM)  O2 Flow:  Room air CC: Breathing problems, congestion/RE   Primary Care Provider:  Dr. Eula Listen  CC:  Breathing problems and congestion/RE.  History of Present Illness: here with 3 to 5 dya gradually worsening with owrsening sob/doe with incr prod cough adn chest congestion  - only small productive;  only walking aobut 50 ft or lsighlty more  before has to sit to rest due ot sob and wheezing.; now usning home o2 continuously in the past few das (does not always use when at rest);  Pt denies CP orthopnea, pnd, worsening LE edema, palps, dizziness or syncope .  Pt denies new neuro symptoms such as headache, facial or extremity weakness  No fever, wt loss, night sweats, loss of appetite or other constitutional symptoms .  Pt denies polydipsia, polyuria, or low sugar symptoms such as shakiness improved with eating.  Overall good compliance with meds, trying to follow low chol, DM diet, wt stable, little excercise however  CBG's in the lower 100;s per pt.  O2 sat today was on 2L - now 94% on 4L in the exam room at rest.    Problems Prior to Update: 1)  Gait Disturbance  (ICD-781.2) 2)  Dyspnea/shortness of Breath  (ICD-786.09) 3)  Preventive Health Care  (ICD-V70.0) 4)  Tremor  (ICD-781.0) 5)  Special Screening Malig Neoplasms Other Sites  (ICD-V76.49) 6)  Fatigue  (ICD-780.79) 7)  Coronary Artery Disease  (ICD-414.00) 8)  Depression  (ICD-311) 9)  Degenerative Joint Disease  (ICD-715.90) 10)  Benign Prostatic Hypertrophy  (ICD-600.00) 11)  Diabetes Mellitus, Type II  (ICD-250.00) 12)  Thrush  (ICD-112.0) 13)  Respiratory Failure,  Chronic  (ICD-518.83) 14)  Hypothyroidism  (ICD-244.9) 15)  Hypertension  (ICD-401.9) 16)  C O P D  (ICD-496)  Medications Prior to Update: 1)  Spiriva Handihaler 18 Mcg Caps (Tiotropium Bromide Monohydrate) .... Inhale Contents of 1 Cap Daily 2)  Centrum Silver  Tabs (Multiple Vitamins-Minerals) .[name].. 1 Once Daily 3)  Crestor 20 Mg Tabs (Rosuvastatin Calcium) .... 1/2 At Bedtime 4)  Levothyroxine Sodium 100 Mcg Tabs (Levothyroxine Sodium) .... One Daily 5)  Levothyroxine Sodium 112 Mcg Tabs (Levothyroxine Sodium) .[name].. 1 By Mouth Daily 6)  Gabapentin 800 Mg Tabs (Gabapentin) .[name].. 1 Three Times A Day 7)  Losartan Potassium 100 Mg Tabs (Losartan Potassium) .... 1/2 Once Daily 8)  Requip 1 Mg Tabs (Ropinirole Hcl) .... Take 1 Tab By Mouth At Bedtime 9)  Fluoxetine Hcl 20 Mg Tabs (Fluoxetine Hcl) .[name].. 1 Tablet Two Times A Day 10)  Flomax 0.4 Mg Caps (Tamsulosin Hcl) .... Take 1 Tab By Mouth At Bedtime 11)  Glipizide 10 Mg Tabs (Glipizide) .[name].. 1 By Mouth Two Times A Day 12)  Metformin Hcl 1000 Mg Tabs (Metformin Hcl) .... One Twice Daily 13)  Aspirin 81 Mg Tbec (Aspirin) .[name].. 1 By Mouth Daily 14)  Lantus 100 Unit/ml Soln (Insulin Glargine) .... 65 Units Subcutaneously Once Daily 15)  Proventil Hfa 108 (90 Base) Mcg/act Aers (Albuterol Sulfate) .... 2 Puffs Every 4 Hours As Needed 16)  Mucinex Dm 30-600 Mg Xr12h-Tab (Dextromethorphan-Guaifenesin) .... Take 1-2 Tablets Every 12 Hours As Needed 17)  Symbicort 160-4.5 Mcg/act  Aero (Budesonide-Formoterol Fumarate) .... 2 Puffs First Thing  in Am and 2 Puffs Again in Pm About 12 Hours Later 18)  Clotrimazole 10 Mg Troc (Clotrimazole) .... One 4 X Daily As Needed For Throat Irritation 19)  Home O2  - 2l At Rest, 4l With Exertion 20)  Hydrochlorothiazide 25 Mg Tabs (Hydrochlorothiazide) .... 1/2 Once Daily 21)  Vitamin D3 2000 Unit Caps (Cholecalciferol) .[name].. 1 Once Daily 22)  Zolpidem Tartrate 10 Mg Tabs (Zolpidem Tartrate) .[name].. 1 At Bedtime As  Needed 23)  Niacin Cr 500 Mg Cr-Caps (Niacin) .[name].. 1 Tablet Once Daily  Current Medications (verified): 1)  Spiriva Handihaler 18 Mcg Caps (Tiotropium Bromide Monohydrate) .... Inhale Contents of 1 Cap Daily 2)  Centrum Silver  Tabs (Multiple Vitamins-Minerals) .[name].. 1 Once Daily 3)  Crestor 20 Mg Tabs (Rosuvastatin Calcium) .... 1/2 At Bedtime 4)  Levothyroxine Sodium 100 Mcg Tabs (Levothyroxine Sodium) .... One Daily 5)  Levothyroxine Sodium 112 Mcg Tabs (Levothyroxine Sodium) .[name].. 1 By Mouth Daily 6)  Gabapentin 800 Mg Tabs (Gabapentin) .[name].. 1 Three Times A Day 7)  Losartan Potassium 100 Mg Tabs (Losartan Potassium) .... 1/2 Once Daily 8)  Requip 1 Mg Tabs (Ropinirole Hcl) .... Take 1 Tab By Mouth At Bedtime 9)  Fluoxetine Hcl 20 Mg Tabs (Fluoxetine Hcl) .[name].. 1 Tablet Two Times A Day 10)  Flomax 0.4 Mg Caps (Tamsulosin Hcl) .... Take 1 Tab By Mouth At Bedtime 11)  Glipizide 10 Mg Tabs (Glipizide) .[name].. 1 By Mouth Two Times A Day 12)  Metformin Hcl 1000 Mg Tabs (Metformin Hcl) .... One Twice Daily 13)  Aspirin 81 Mg Tbec (Aspirin) .[name].. 1 By Mouth Daily 14)  Lantus 100 Unit/ml Soln (Insulin Glargine) .... 65 Units Subcutaneously Once Daily 15)  Proventil Hfa 108 (90 Base) Mcg/act Aers (Albuterol Sulfate) .... 2 Puffs Every 4 Hours As Needed 16)  Mucinex Dm 30-600 Mg Xr12h-Tab (Dextromethorphan-Guaifenesin) .... Take 1-2 Tablets Every 12 Hours As Needed 17)  Symbicort 160-4.5 Mcg/act  Aero (Budesonide-Formoterol Fumarate) .... 2 Puffs First Thing  in Am and 2 Puffs Again in Pm About 12 Hours Later 18)  Clotrimazole 10 Mg Troc (Clotrimazole) .... One 4 X Daily As Needed For Throat Irritation 19)  Home O2  - 2l At Rest, 4l With Exertion 20)  Hydrochlorothiazide 25 Mg Tabs (Hydrochlorothiazide) .... 1/2 Once Daily 21)  Vitamin D3 2000 Unit Caps (Cholecalciferol) .[name].. 1 Once Daily 22)  Zolpidem Tartrate 10 Mg Tabs (Zolpidem Tartrate) .[name].. 1 At Bedtime As Needed 23)  Niacin Cr 500 Mg Cr-Caps  (Niacin) .[name].. 1 Tablet Once Daily 24)  Azithromycin 250 Mg Tabs (Azithromycin) .... 2po Qd For 1 Day, Then 1po Qd For 4days, Then Stop 25)  Prednisone 10 Mg Tabs (Prednisone) .... 4po Qd For 3days, Then 3po Qd For 3days, Then 2po Qd For 3days, Then 1po Qd For 3 Days, Then Stop  Allergies (verified): No Known Drug Allergies  Past History:  Past Medical History: Last updated: 02/21/2010 HYPOTHYROIDISM (ICD-244.9)    - TSH  .276 12/209/10 HYPERTENSION (ICD-401.9) C O P D (ICD-496)...........................................................[name] Dr Sherene Sires      - HFA 50% July 31, 2009 >   90% September 26, 2009 > 90% February 07, 2010       -  PFT's September 26, 2009 FEV1 1.27 (38%) ratio 36 with 30% improvement  after B2, DLC0 35 corrects to 66 Right Pleural Calcified plaques s/p remote pna     - CT Chest 05/31/2010 Diabetes mellitus, type II Benign prostatic hypertrophy DJD Depression Coronary artery disease - non-obstructive by cath juen 2003 - Dr Myriam Jacobson [place]/card tremor gait disturbance - Dr Terrace Arabia - ? parkinsons  Past Surgical History: Last updated: 11/23/2009 Small Bowel Obstruction Repair with adhesiolysis hx of remote ileum resection due to bleeding Abdominal Aneurysm Repair s/p back surgury 1972, lumbar laminectomy - Dr Fannie Knee s/p left femoral embolectomy with left leg ischemia - Dr Sena Slate s/p right ganglion cyst - Dr Teressa Senter  Social History: Last updated: 11/23/2009 Patient states former smoker, quit in 2000 x82yrs, 2.5ppd.  Married Lives in the home with his wife Alcohol use-no Drug use-no 1 child  disabled veteran - sees [place] every 6 mo, for meds  Risk Factors: Smoking Status: quit (07/30/2009)  Review of Systems       all otherwise negative per pt -    Physical Exam  General:  alert and overweight-appearing.   Head:  normocephalic and atraumatic.   Eyes:  vision grossly intact, pupils equal, and pupils round.   Ears:  bilat tm's red after left ear canal cleared of  wax and hearing improved, sinus nontender bilat Nose:  nasal dischargemucosal pallor and mucosal edema.   Mouth:  pharyngeal erythema and fair dentition.   Neck:  supple and no masses.   Lungs:  normal respiratory effort, R decreased breath sounds, and L decreased breath sounds.  with bilalt mild wheeze Heart:  normal rate and regular rhythm.   Extremities:  trace ankle edema bilat   Impression & Recommendations:  Problem # 1:  BRONCHITIS-ACUTE (ICD-466.0)  His updated medication list for this problem includes:    Spiriva Handihaler 18 Mcg Caps (Tiotropium bromide monohydrate) ..... Inhale contents of 1 cap daily    Proventil Hfa 108 (90 Base) Mcg/act Aers (Albuterol sulfate) .[name]... 2 puffs every 4 hours as needed    Mucinex Dm 30-600 Mg Xr12h-tab (Dextromethorphan-guaifenesin) .[name]... Take 1-2 tablets every 12 hours as needed    Symbicort 160-4.5 Mcg/act Aero (Budesonide-formoterol fumarate) .[name]... 2 puffs first thing  in am and 2 puffs again in pm about 12 hours later    Azithromycin 250 Mg Tabs (Azithromycin) .[name]... 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 1/2 once daily    Glipizide 10 Mg Tabs (Glipizide) .[name]... 1 by mouth two times a day    Metformin Hcl 1000 Mg Tabs (Metformin hcl) ..... One twice daily    Aspirin 81 Mg Tbec (Aspirin) .[name]... 1 by mouth daily    Lantus 100 Unit/ml Soln (Insulin glargine) .Marland KitchenMarland KitchenMarland KitchenMarland Kitchen 65 units subcutaneously once daily  Labs  Reviewed: Creat: 1.0 (11/23/2009)    Reviewed HgBA1c results: 8.4 (11/23/2009) stable overall by hx and exam, ok to continue meds/tx as is - pt to call  for cbg > 200  Complete Medication List: 1)  Spiriva Handihaler 18 Mcg Caps (Tiotropium bromide monohydrate) .... Inhale contents of 1 cap daily 2)  Centrum Silver Tabs (Multiple vitamins-minerals) .[name].. 1 once daily 3)  Crestor 20 Mg Tabs (Rosuvastatin calcium) .... 1/2 at bedtime 4)  Levothyroxine Sodium 100 Mcg Tabs (Levothyroxine sodium) .... One daily 5)  Levothyroxine Sodium 112 Mcg Tabs (Levothyroxine sodium) .[name].. 1 by mouth daily 6)  Gabapentin 800 Mg Tabs (Gabapentin) .[name].. 1 three times a day 7)  Losartan Potassium 100 Mg Tabs (Losartan potassium) .... 1/2 once daily 8)  Requip 1 Mg Tabs (Ropinirole hcl) .... Take 1 tab by mouth at bedtime 9)  Fluoxetine Hcl 20 Mg Tabs (Fluoxetine hcl) .[name].. 1 tablet two times a day 10)  Flomax 0.4 Mg Caps (Tamsulosin hcl) .... Take 1 tab by mouth at bedtime 11)  Glipizide 10 Mg Tabs (Glipizide) .[name].. 1 by mouth two times a day 12)  Metformin Hcl 1000 Mg Tabs (Metformin hcl) .... One twice daily 13)  Aspirin 81 Mg Tbec (Aspirin) .[name].. 1 by mouth daily 14)  Lantus 100 Unit/ml Soln (Insulin glargine) .... 65 units subcutaneously once daily 15)  Proventil Hfa 108 (90 Base) Mcg/act Aers (Albuterol sulfate) .... 2 puffs every 4 hours as needed 16)  Mucinex Dm 30-600 Mg Xr12h-tab (Dextromethorphan-guaifenesin) .... Take 1-2 tablets every 12 hours as needed 17)  Symbicort 160-4.5 Mcg/act Aero (Budesonide-formoterol fumarate) .... 2 puffs first thing  in am and 2 puffs again in pm about 12 hours later 18)  Clotrimazole 10 Mg Troc (Clotrimazole) .... One 4 x daily as needed for throat irritation 19)  Home O2 - 2l At Rest, 4l With Exertion  20)  Hydrochlorothiazide 25 Mg Tabs (Hydrochlorothiazide) .... 1/2 once daily 21)  Vitamin D3 2000 Unit Caps (Cholecalciferol) .[name].. 1 once daily 22)  Zolpidem Tartrate 10 Mg  Tabs (Zolpidem tartrate) .[name].. 1 at bedtime as needed 23)  Niacin Cr 500 Mg Cr-caps (Niacin) .[name].. 1 tablet once daily 24)  Azithromycin 250 Mg Tabs (Azithromycin) .... 2po qd for 1 day, then 1po qd for 4days, then stop 25)  Prednisone 10 Mg Tabs (Prednisone) .... 4po qd for 3days, then 3po qd for 3days, then 2po qd for 3days, then 1po qd for 3 days, then stop  Patient Instructions: 1)  you had the steroid shot today, and the breathing treatement, and the left ear irrigation 2)  Please take all new medications as prescribed  - the antibiotic, and prednisone were sent to your pharmacy 3)  Continue all previous medications as before this visit   4)  Please schedule a follow-up appointment in 3 months , or sooner if needed Prescriptions: PREDNISONE 10 MG TABS (PREDNISONE) 4po qd for 3days, then 3po qd for 3days, then 2po qd for 3days, then 1po qd for 3 days, then stop  #30 x 0   Entered and Authorized by:   Corwin Levins MD   Signed by:   Corwin Levins MD on 03/01/2010   Method used:   Electronically to        [facility]. [phone]* (retail)       [address]       [place], [place]  [address]       Ph: [phone]       Fax: [phone]   RxID:   [id number] AZITHROMYCIN 250 MG TABS (AZITHROMYCIN) 2po qd for 1 day, then  1po qd for 4days, then stop  #6 x 1   Entered and Authorized by:   Corwin Levins MD   Signed by:   Corwin Levins MD on 03/01/2010   Method used:   Electronically to        [facility]. [phone]* (retail)       [address]       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)    Route: IM    Site: LUOQ gluteus    Exp Date: 10/2012    Lot #: [id number]     Mfr: [employer]    Comments: Patient received 120mg  Depo-Medrol    Patient tolerated injection without complications    Given by: Zella Ball Ewing CMA (AAMA) (March 01, 2010 3:00 PM)  Injection # 2:    Medication: Depo- Medrol 80mg     Diagnosis: CHRONIC OBSTRUCTIVE PULMONARY DISEASE, ACUTE EXACERBATION (ICD-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

## 2010-08-29 NOTE — Assessment & Plan Note (Signed)
Summary: charge update

## 2010-08-29 NOTE — Assessment & Plan Note (Signed)
Summary: 3 MO ROV /NWS   Vital Signs:  Patient profile:   70 year old male Height:      75 inches (190.50 cm) Weight:      247 pounds (112.27 kg) BMI:     30.98 O2 Sat:      92 % on 2 L/min Temp:     96.8 degrees F (36 degrees C) oral Pulse rate:   91 / minute BP sitting:   126 / 68  (left arm) Cuff size:   large  Vitals Entered By: Brenton Grills MA (February 21, 2010 8:06 AM)  O2 Flow:  2 L/min CC: 3 mo F/U/aj Is Patient Diabetic? Yes Comments PT is no longer tkaing Gemfibrozil or Finasteride   Primary Care Provider:  Dr. Eula Listen  CC:  3 mo F/U/aj.  History of Present Illness: here to f/u;  sen in ER in the meantime for copd exac and tx with high dose prednisone;  sees a DM provider at the [place]  [facility] - lopid stopped , as well as the finasteride;  was having a few low sugars , now lantus back to 65 units .  Pt denies CP, worsening sob, doe, wheezing, orthopnea, pnd, worsening LE edema, palps, dizziness or syncope .  Pt denies new neuro symptoms such as headache, facial or extremity weakness  - does have some parkinson symptoms per Dr Terrace Arabia , had MRI recent, for f/u in approx 2 wks.  Did cut out his snacks at night as well that seemed to help CBG's.  Pt denies polydipsia, polyuria, or low sugar symptoms such as shakiness improved with eating.  Overall good compliance with meds, trying to follow low chol, DM diet, wt stable, little excercise however   Problems Prior to Update: 1)  Gait Disturbance  (ICD-781.2) 2)  Dyspnea/shortness of Breath  (ICD-786.09) 3)  Preventive Health Care  (ICD-V70.0) 4)  Tremor  (ICD-781.0) 5)  Special Screening Malig Neoplasms Other Sites  (ICD-V76.49) 6)  Fatigue  (ICD-780.79) 7)  Coronary Artery Disease  (ICD-414.00) 8)  Depression  (ICD-311) 9)  Degenerative Joint Disease  (ICD-715.90) 10)  Benign Prostatic Hypertrophy  (ICD-600.00) 11)  Diabetes Mellitus, Type II  (ICD-250.00) 12)  Thrush  (ICD-112.0) 13)  Respiratory Failure, Chronic   (ICD-518.83) 14)  Hypothyroidism  (ICD-244.9) 15)  Hypertension  (ICD-401.9) 16)  C O P D  (ICD-496)  Medications Prior to Update: 1)  Spiriva Handihaler 18 Mcg Caps (Tiotropium Bromide Monohydrate) .... Inhale Contents of 1 Cap Daily 2)  Centrum Silver  Tabs (Multiple Vitamins-Minerals) .[name].. 1 Once Daily 3)  Gemfibrozil 600 Mg Tabs (Gemfibrozil) .[name].. 1 Tab By Mouth  Every Morning and At Bedtime 4)  Crestor 20 Mg Tabs (Rosuvastatin Calcium) .... 1/2 At Bedtime 5)  Levothyroxine Sodium 100 Mcg Tabs (Levothyroxine Sodium) .... One Daily 6)  Levothyroxine Sodium 112 Mcg Tabs (Levothyroxine Sodium) .[name].. 1 By Mouth Daily 7)  Gabapentin 800 Mg Tabs (Gabapentin) .[name].. 1 Three Times A Day 8)  Losartan Potassium 100 Mg Tabs (Losartan Potassium) .... 1/2 Once Daily 9)  Requip 1 Mg Tabs (Ropinirole Hcl) .... Take 1 Tab By Mouth At Bedtime 10)  Fluoxetine Hcl 20 Mg Tabs (Fluoxetine Hcl) .[name].. 1 Once Daily 11)  Finasteride 5 Mg Tabs (Finasteride) .... Take 1 Tab By Mouth At Bedtime 12)  Flomax 0.4 Mg Caps (Tamsulosin Hcl) .... Take 1 Tab By Mouth At Bedtime 13)  Glipizide 10 Mg Tabs (Glipizide) .[name].. 1 By Mouth Two Times A Day 14)  Metformin Hcl 1000 Mg Tabs (Metformin Hcl) .... One Twice Daily 15)  Aspirin 81 Mg Tbec (Aspirin) .[name].. 1 By Mouth Daily 16)  Lantus 100 Unit/ml Soln (Insulin Glargine) .... 75 Units Subcutaneously Once Daily 17)  Proventil Hfa 108 (90 Base) Mcg/act Aers (Albuterol Sulfate) .... 2 Puffs Every 4 Hours As Needed 18)  Mucinex Dm 30-600 Mg Xr12h-Tab (Dextromethorphan-Guaifenesin) .... Take 1-2 Tablets Every 12 Hours As Needed 19)  Symbicort 160-4.5 Mcg/act  Aero (Budesonide-Formoterol Fumarate) .... 2 Puffs First Thing  in Am and 2 Puffs Again in Pm About 12 Hours Later 20)  Clotrimazole 10 Mg Troc (Clotrimazole) .... One 4 X Daily As Needed For Throat Irritation 21)  Home O2  - 2l At Rest, 4l With Exertion 22)  Hydrochlorothiazide 25 Mg Tabs (Hydrochlorothiazide) .... 1/2 Once  Daily 23)  Vitamin D3 2000 Unit Caps (Cholecalciferol) .[name].. 1 Once Daily 24)  Zolpidem Tartrate 10 Mg Tabs (Zolpidem Tartrate) .[name].. 1 At Bedtime As Needed  Current Medications (verified): 1)  Spiriva Handihaler 18 Mcg Caps (Tiotropium Bromide Monohydrate) .... Inhale Contents of 1 Cap Daily 2)  Centrum Silver  Tabs (Multiple Vitamins-Minerals) .[name].. 1 Once Daily 3)  Crestor 20 Mg Tabs (Rosuvastatin Calcium) .... 1/2 At Bedtime 4)  Levothyroxine Sodium 100 Mcg Tabs (Levothyroxine Sodium) .... One Daily 5)  Levothyroxine Sodium 112 Mcg Tabs (Levothyroxine Sodium) .[name].. 1 By Mouth Daily 6)  Gabapentin 800 Mg Tabs (Gabapentin) .[name].. 1 Three Times A Day 7)  Losartan Potassium 100 Mg Tabs (Losartan Potassium) .... 1/2 Once Daily 8)  Requip 1 Mg Tabs (Ropinirole Hcl) .... Take 1 Tab By Mouth At Bedtime 9)  Fluoxetine Hcl 20 Mg Tabs (Fluoxetine Hcl) .[name].. 1 Tablet Two Times A Day 10)  Flomax 0.4 Mg Caps (Tamsulosin Hcl) .... Take 1 Tab By Mouth At Bedtime 11)  Glipizide 10 Mg Tabs (Glipizide) .[name].. 1 By Mouth Two Times A Day 12)  Metformin Hcl 1000 Mg Tabs (Metformin Hcl) .... One Twice Daily 13)  Aspirin 81 Mg Tbec (Aspirin) .[name].. 1 By Mouth Daily 14)  Lantus 100 Unit/ml Soln (Insulin Glargine) .... 65 Units Subcutaneously Once Daily 15)  Proventil Hfa 108 (90 Base) Mcg/act Aers (Albuterol Sulfate) .... 2 Puffs Every 4 Hours As Needed 16)  Mucinex Dm 30-600 Mg Xr12h-Tab (Dextromethorphan-Guaifenesin) .... Take 1-2 Tablets Every 12 Hours As Needed 17)  Symbicort 160-4.5 Mcg/act  Aero (Budesonide-Formoterol Fumarate) .... 2 Puffs First Thing  in Am and 2 Puffs Again in Pm About 12 Hours Later 18)  Clotrimazole 10 Mg Troc (Clotrimazole) .... One 4 X Daily As Needed For Throat Irritation 19)  Home O2  - 2l At Rest, 4l With Exertion 20)  Hydrochlorothiazide 25 Mg Tabs (Hydrochlorothiazide) .... 1/2 Once Daily 21)  Vitamin D3 2000 Unit Caps (Cholecalciferol) .[name].. 1 Once Daily 22)  Zolpidem Tartrate 10 Mg Tabs  (Zolpidem Tartrate) .[name].. 1 At Bedtime As Needed 23)  Niacin Cr 500 Mg Cr-Caps (Niacin) .[name].. 1 Tablet Once Daily  Allergies (verified): No Known Drug Allergies  Past History:  Past Surgical History: Last updated: 11/23/2009 Small Bowel Obstruction Repair with adhesiolysis hx of remote ileum resection due to bleeding Abdominal Aneurysm Repair s/p back surgury 1972, lumbar laminectomy - Dr Fannie Knee s/p left femoral embolectomy with left leg ischemia - Dr Sena Slate s/p right ganglion cyst - Dr Teressa Senter  Social History: Last updated: 11/23/2009 Patient states former smoker, quit in 2000 x14yrs, 2.5ppd.  Married Lives in the home with his wife Alcohol use-no Drug use-no  1 child  disabled veteran - sees [place] every 6 mo, for meds  Risk Factors: Smoking Status: quit (07/30/2009)  Past Medical History: HYPOTHYROIDISM (ICD-244.9)    - TSH  .276 12/209/10 HYPERTENSION (ICD-401.9) C O P D (ICD-496)...........................................................[name] Dr Sherene Sires      - HFA 50% July 31, 2009 >   90% September 26, 2009 > 90% February 07, 2010       - PFT's September 26, 2009 FEV1 1.27 (38%) ratio 36 with 30% improvement  after B2, DLC0 35 corrects to 66 Right Pleural Calcified plaques s/p remote pna     - CT Chest 05/31/2010 Diabetes mellitus, type II Benign prostatic hypertrophy DJD Depression Coronary artery disease - non-obstructive by cath juen 2003 - Dr Myriam Jacobson Preston/card tremor gait disturbance - Dr Terrace Arabia - ? parkinsons  Review of Systems       all otherwise negative per pt -    Physical Exam  General:  alert and overweight-appearing.   Head:  normocephalic and atraumatic.   Eyes:  vision grossly intact, pupils equal, and pupils round.   Ears:  R ear normal and L ear normal.   Nose:  no external deformity and no nasal discharge.   Mouth:  no gingival abnormalities and pharynx pink and moist.   Neck:  supple and no masses.   Lungs:  normal respiratory effort, R decreased breath  sounds, and L decreased breath sounds.  but no wheeze or rales Heart:  normal rate and regular rhythm.   Extremities:  no edema, no erythema    Impression & Recommendations:  Problem # 1:  DIABETES MELLITUS, TYPE II (ICD-250.00)  His updated medication list for this problem includes:    Losartan Potassium 100 Mg Tabs (Losartan potassium) .[name]... 1/2 once daily    Glipizide 10 Mg Tabs (Glipizide) .[name]... 1 by mouth two times a day    Metformin Hcl 1000 Mg Tabs (Metformin hcl) ..... One twice daily    Aspirin 81 Mg Tbec (Aspirin) .[name]... 1 by mouth daily    Lantus 100 Unit/ml Soln (Insulin glargine) .Marland KitchenMarland KitchenMarland KitchenMarland Kitchen 65 units subcutaneously once daily confusing and difficult situation as he has mult providers, but cbg's ultimately improved, stable overall by hx and exam, ok to continue meds/tx as is, Pt to cont DM diet, excercise, wt control  Labs Reviewed: Creat: 1.0 (11/23/2009)    Reviewed HgBA1c results: 8.4 (11/23/2009)  Problem # 2:  HYPERTENSION (ICD-401.9)  His updated medication list for this problem includes:    Losartan Potassium 100 Mg Tabs (Losartan potassium) .[name]... 1/2 once daily    Hydrochlorothiazide 25 Mg Tabs (Hydrochlorothiazide) .[name]... 1/2 once daily  BP today: 126/68 Prior BP: 120/78 (02/07/2010)  Labs Reviewed: K+: 3.9 (11/23/2009) Creat: : 1.0 (11/23/2009)   Chol: 172 (11/23/2009)   HDL: 34.20 (11/23/2009)   LDL: 107 (11/23/2009)   TG: 154.0 (11/23/2009) stable overall by hx and exam, ok to continue meds/tx as is   Problem # 3:  C O P D (ICD-496)  His updated medication list for this problem includes:    Spiriva Handihaler 18 Mcg Caps (Tiotropium bromide monohydrate) ..... Inhale contents of 1 cap daily    Proventil Hfa 108 (90 Base) Mcg/act Aers (Albuterol sulfate) .[name]... 2 puffs every 4 hours as needed    Symbicort 160-4.5 Mcg/act Aero (Budesonide-formoterol fumarate) .[name]... 2 puffs first thing  in am and 2 puffs again in pm about 12 hours later stable overall by hx and  exam, ok to continue meds/tx  as is   Problem # 4:  GAIT DISTURBANCE (ICD-781.2) to f/u neuro as planned  - ? parkinsons  Complete Medication List: 1)  Spiriva Handihaler 18 Mcg Caps (Tiotropium bromide monohydrate) .... Inhale contents of 1 cap daily 2)  Centrum Silver Tabs (Multiple vitamins-minerals) .[name].. 1 once daily 3)  Crestor 20 Mg Tabs (Rosuvastatin calcium) .... 1/2 at bedtime 4)  Levothyroxine Sodium 100 Mcg Tabs (Levothyroxine sodium) .... One daily 5)  Levothyroxine Sodium 112 Mcg Tabs (Levothyroxine sodium) .[name].. 1 by mouth daily 6)  Gabapentin 800 Mg Tabs (Gabapentin) .[name].. 1 three times a day 7)  Losartan Potassium 100 Mg Tabs (Losartan potassium) .... 1/2 once daily 8)  Requip 1 Mg Tabs (Ropinirole hcl) .... Take 1 tab by mouth at bedtime 9)  Fluoxetine Hcl 20 Mg Tabs (Fluoxetine hcl) .[name].. 1 tablet two times a day 10)  Flomax 0.4 Mg Caps (Tamsulosin hcl) .... Take 1 tab by mouth at bedtime 11)  Glipizide 10 Mg Tabs (Glipizide) .[name].. 1 by mouth two times a day 12)  Metformin Hcl 1000 Mg Tabs (Metformin hcl) .... One twice daily 13)  Aspirin 81 Mg Tbec (Aspirin) .[name].. 1 by mouth daily 14)  Lantus 100 Unit/ml Soln (Insulin glargine) .... 65 units subcutaneously once daily 15)  Proventil Hfa 108 (90 Base) Mcg/act Aers (Albuterol sulfate) .... 2 puffs every 4 hours as needed 16)  Mucinex Dm 30-600 Mg Xr12h-tab (Dextromethorphan-guaifenesin) .... Take 1-2 tablets every 12 hours as needed 17)  Symbicort 160-4.5 Mcg/act Aero (Budesonide-formoterol fumarate) .... 2 puffs first thing  in am and 2 puffs again in pm about 12 hours later 18)  Clotrimazole 10 Mg Troc (Clotrimazole) .... One 4 x daily as needed for throat irritation 19)  Home O2 - 2l At Rest, 4l With Exertion  20)  Hydrochlorothiazide 25 Mg Tabs (Hydrochlorothiazide) .... 1/2 once daily 21)  Vitamin D3 2000 Unit Caps (Cholecalciferol) .[name].. 1 once daily 22)  Zolpidem Tartrate 10 Mg Tabs (Zolpidem tartrate) .[name].. 1 at bedtime as  needed 23)  Niacin Cr 500 Mg Cr-caps (Niacin) .[name].. 1 tablet once daily  Patient Instructions: 1)  Continue all previous medications as before this visit 2)  Please schedule a follow-up appointment in march 2012 for "yearly medicare exam", or sooner if needed

## 2010-08-29 NOTE — Assessment & Plan Note (Signed)
Summary: follow up with PFTs and needs o2 recert / cj   Visit Type:  Follow-up Primary Provider/Referring Provider:  Dr. [name] - PMD, Dr. Marchelle Gearing - Pulmonary, Rheumatologist at [facility] - name not known   CC:  Pt here for 3 month f/u to discuss PFT. [name]  History of Present Illness: 70  yowm quit smoking around 2000 with GOLD III COPD by PFT's 09/2009. Meds through [facility]  June 18, 2010: EX 100 pack year smoker. Home O2 dependent Gold stage 3 COPD with low DLCO 35%  in PFTs MArch 2011. BAeline pulse ox is 87-93% on 2L [place]. In,  OCt 2011 Ct showing some UIP component but predmoninant COPD on CT. Transferring care from Dr. Sherene Sires to myself.  In Oct 2011 had some mild AECOPD related hemoptysis with CT showing only UIP but not showing any mass. Since then hemoptysis resolved. Currently feels baseline on 2-3L [place]. Claudication lmits him more than dyspnea at 1/4 th mile. Denies chronic cough but on mucinex and feels it helps. On spiriva, foradil and asmanex through [facility]. Has not done rehab though wife is attending rehab. Denies chest pains, edema, wheezing, fever, colored sputum. REC: CHECK AUTOIMMUNE PROFILE,  START NAC, GET FULL PFTs   August 12, 2010: Followup. in interim was admitted for sBP12/19/2011 due to [facility] from prior laparotomies. Resolved with conservative Rx. Started pulmnary rehab 2 weeks ago. Attend 4 sesssions. It is helping so far and he likes it. No new complaints. Dyspnea is stable class 2-3. Denies associated cough, chest pain, hemoptysis, edema, sputum, weight loss. Had autoimmune profile 07/03/2010 - CRP 10, RF 352 but otherwise negative. Upon revealing labs he states he is known to have RA diagnosed over 10 years ago and was on enbrel. He is due to see a rheumatologist at [place], [place]. PFTs today continue to show Gold stage 3 COPD and somewhat improved from a year ago.         Preventive Screening-Counseling & Management  Alcohol-Tobacco  Smoking Status: quit     Packs/Day: 2.5     Year Started: 1960     Year Quit: 2000     Pack years: 100  Current Medications (verified): 1)  Spiriva Handihaler 18 Mcg Caps (Tiotropium Bromide Monohydrate) .... Inhale Contents of 1 Cap Daily 2)  Foradil Aerolizer 12 Mcg Caps (Formoterol Fumarate) .... Inhale Contents of 1 Capsule Two Times A Day 3)  Asmanex 60 Metered Doses 220 Mcg/inh Aepb (Mometasone Furoate) .... Inhale 2 Puffs By Mouth At Bedtime 4)  Simvastatin 20 Mg Tabs (Simvastatin) .... 1/4 By Mouth Once Daily 5)  Slo-Niacin 500 Mg Cr-Tabs (Niacin) .... Take 1 Tab By Mouth At Bedtime 6)  Gabapentin 800 Mg Tabs (Gabapentin) .[name].. 1 Tab By Mouth  Every Morning and At Bedtime 7)  Losartan Potassium 100 Mg Tabs (Losartan Potassium) .... 1/2 Once Daily 8)  Hydrochlorothiazide 25 Mg Tabs (Hydrochlorothiazide) .... 1/2 Tab By Mouth Once Daily 9)  Levothyroxine Sodium 200 Mcg Tabs (Levothyroxine Sodium) .[name].. 1 Tab By Mouth  Every Morning and At Bedtime 10)  Doxepin Hcl 50 Mg Caps (Doxepin Hcl) .... Take 1 Capsule By Mouth At Bedtime 11)  Doxepin Hcl 10 Mg Caps (Doxepin Hcl) .[name].. 1 Capsule By Mouth  Every Morning and in The Evening 12)  Flomax 0.4 Mg Caps (Tamsulosin Hcl) .... Take 1 Tab By Mouth At Bedtime 13)  Aspirin 81 Mg Tbec (Aspirin) .... Take 1 Tablet By Mouth Once A Day 14)  Metformin Hcl  1000 Mg Tabs (Metformin Hcl) .... One Twice Daily 15)  Novolog 100 Unit/ml Soln (Insulin Aspart) .... Sliding Scale As Needed With Meals 16)  Lantus 100 Unit/ml Soln (Insulin Glargine) .... 45 Units Subcutaneously Once Daily With Dinner 17)  Mega Multi Men  Cr-Tabs (Multiple Vitamins-Minerals) .... Take 1 Tablet By Mouth Once A Day 18)  Vitamin D3 2000 Unit Caps (Cholecalciferol) .[name].. 1 Once Daily 19)  Requip 1 Mg Tabs (Ropinirole Hcl) .... Take 1 Tab By Mouth At Bedtime 20)  Finasteride 5 Mg Tabs (Finasteride) .... Take 1 Tablet By Mouth Once A Day 21)  Oxygen .... Wear 2l/min Continuously 22)   Proair Hfa 108 (90 Base) Mcg/act Aers (Albuterol Sulfate) .... 2 Puffs Every 4 Hours As Needed 23)  Guaifenesin 400 Mg Tabs (Guaifenesin) .... 2tabs By Mouth Two Times A Day As Needed 24)  Zyrtec Allergy 10 Mg Tabs (Cetirizine Hcl) .... Take 1 Tab By Mouth At Bedtime As Needed 25)  Oxygen .... Increase To 4l/min With Activity As Needed 26)  Oxycodone Hcl 5 Mg Caps (Oxycodone Hcl) .[name].. 1 - 2 By Mouth Q 6 Hrs As Needed 27)  Nac 600 600 Mg Caps (Acetylcysteine) .... Take 1 Tablet By Mouth Three Times A Day  Allergies (verified): No Known Drug Allergies  Past History:  Past medical, surgical, family and social histories (including risk factors) reviewed, and no changes noted (except as noted below).  Past Medical History: HYPOTHYROIDISM (ICD-244.9)    - TSH  .276 12/209/10 HYPERTENSION (ICD-401.9) C O P D (ICD-496)...........................................................[name] Dr Sherene Sires      - HFA 50% July 31, 2009 >   90% September 26, 2009 > 90% February 07, 2010       - PFT's September 26, 2009 FEV1 1.27 (38%) ratio 36 with 30% improvement  after B2, DLC0 35 corrects to 66       -CT sinus neg.  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 6% BD respinse DLCO 43%, TLC 84% Diabetes mellitus, type II Benign prostatic hypertrophy DJD Depression Coronary artery disease - non-obstructive by cath juen 2003 - Dr Myriam Jacobson Preston/card tremor gait disturbance - Dr Terrace Arabia - ? parkinsons complex med regimen--Meds reviewed with pt education and computerized med calendar 06/04/10 Rheumatoid Arthritis ....[facility]  - Diagnosed before 2000, Was on enbrel 1-2 years in 2001 LLE DVT per history  in 2000  - on coumadin 3-6 months per hx  Past Surgical History: Reviewed history from 11/23/2009 and no changes required. Small Bowel Obstruction Repair with adhesiolysis hx of remote  ileum resection due to bleeding Abdominal Aneurysm Repair s/p back surgury 1972, lumbar laminectomy - Dr Fannie Knee s/p left femoral embolectomy with left leg ischemia - Dr Sena Slate s/p right ganglion cyst - Dr Teressa Senter  Family History: Reviewed history from 07/31/2009 and no changes required. Negative for respiratory diseases or atopy   Social History: Reviewed history from 06/18/2010 and no changes required. Patient states former smoker, quit in 2000 x40yrs, 2.5ppd.  Married Lives in the home with his wife Alcohol use-no Drug use-no 1 child  disabled veteran - sees [place] every 6 mo, for meds Ex- [employer] Denies asbestos exposure  Review of Systems       The patient complains of shortness of breath with activity.  The patient denies shortness of breath at  rest, productive cough, non-productive cough, coughing up blood, chest pain, irregular heartbeats, acid heartburn, indigestion, loss of appetite, weight change, abdominal pain, difficulty swallowing, sore throat, tooth/dental problems, headaches, nasal congestion/difficulty breathing through nose, sneezing, itching, ear ache, anxiety, depression, hand/feet swelling, joint stiffness or pain, rash, change in color of mucus, and fever.    Vital Signs:  Patient profile:   70 year old male Height:      74.5 inches O2 Sat:      88 % on Room air Temp:     97.8 degrees F oral Pulse rate:   86 / minute BP sitting:   130 / 76  (right arm) Cuff size:   regular  Vitals Entered By: Carron Curie CMA (August 12, 2010 1:46 PM)  O2 Flow:  Room air CC: Pt here for 3 month f/u to discuss PFT.  Comments Medications reviewed with patient Carron Curie CMA  August 12, 2010 1:48 PM Daytime phone number verified with patient.    Physical Exam  General:  well developed, well nourished, in no acute distressobese.   Head:  normocephalic and atraumatic Eyes:  PERRLA/EOM intact; conjunctiva and sclera clear Ears:  TMs intact and  clear with normal canals Nose:  no deformity, discharge, inflammation, or lesions Mouth:  no deformity or lesions Neck:  no masses, thyromegaly, or abnormal cervical nodes Chest Wall:  no deformities noted Lungs:  decreased BS bilateral and coarse velcro crackles at bases  Heart:  regular rate and rhythm, S1, S2 without murmurs, rubs, gallops, or clicks Abdomen:  bowel sounds positive; abdomen soft and non-tender without masses, or organomegaly Msk:  no deformity or scoliosis noted with normal posture Pulses:  pulses normal Extremities:  no clubbing, cyanosis, edema, or deformity noted Neurologic:  CN II-XII grossly intact with normal reflexes, coordination, muscle strength and tone Skin:  intact without lesions or rashes Cervical Nodes:  no significant adenopathy Axillary Nodes:  no significant adenopathy Psych:  alert and cooperative; normal mood and affect; normal attention span and concentration   MISC. Report  Procedure date:  08/12/2010  Findings:      Had autoimmune profile 07/03/2010 - CRP 10, RF 352 but otherwise negative. Upon revealing labs he states he is known to have RA diagnosed over 10 years ago and was on enbrel. He is due to see a rheumatologist at [place], [place]. PFTs today continue to show Gold stage 3 COPD and somewhat improved from a year ago.  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 PFts also show out opf proprotion low DLCO c/w associated COPD and Pulmonary fibrosis. Denies asbestos exposure and the pleural plaque has been deemed likely due to prior pna. Autoimmune profile 07/03/2010 signoficant for isolated extreme high positive Rheumatoid factor (350). Therefore, likely fibrosis related to RA.  No evidence of progression on PFT since March 2011  PLAN start NAC 600mg  by mouth three times a day continue pumonary rehab o2  to keep pox >88% Await for him to enrol with a pulmonary doc at [place], [place] Monitor PFTs IF PFTs worsen, we can ask his rheuamatologist to intervent with DMARD No other specific Rx dpeending on rehab course could consider transplant referral but he has multiple medical problems and needs to loose weight not a candidate for ASCEND PIRFENIDONE trial due to overwhelming copd  Problem # 2:  C O P D (  ICD-496) Assessment: Unchanged currently stable  plan continue mdi per [facility] protocol refer pulmonary rehab next visit check alpha 1  Medications Added to Medication List This Visit: 1)  Nac 600 600 Mg Caps (Acetylcysteine) .... Take 1 tablet by mouth three times a day  Other Orders: Est. Patient Level IV (04540)  Patient Instructions: 1)  continue your ihhalers 2)  talk to my nurse about O2 certification procedure 3)  continu rehab 4)  continue oxygen 5)  contnue nacetyl cysteine 6)  return in 6 months  7)  come sooner if there are problems 8)  will need to see what his rheumatologist states about his RA 9)  next visit check alpha 1  Appended Document: follow up with PFTs and needs o2 recert / cj Checked pt on room air and sats were 88% at rest. Placed pt on 2 liters oxygen and sats increased to 91%.   Appended Document: order for O2 recert    Clinical Lists Changes  Orders: Added new Referral order of DME Referral (DME) - Signed

## 2010-08-29 NOTE — Assessment & Plan Note (Signed)
Summary: cold-cough   Vital Signs:  Patient profile:   70 year old male Weight:      262 pounds O2 Sat:      86 % on 2 L/min Temp:     98.5 degrees F oral Pulse rate:   86 / minute Resp:     20 per minute BP sitting:   140 / 80  (left arm)  Vitals Entered By: Doristine Devoid (Dec 08, 2009 9:16 AM)  O2 Flow:  2 L/min CC: cough and chest congestion also having SOB    History of Present Illness: "I ache all over, not able to get any rest" cough but can't get anything up taking mucinex Chronic lung probelms and sees Dr Sherene Sires seems to have been coming on over 1 month  No clear fever some sweats  Breathing is worse--has increased oxygen to 2 liters per minute  No chest pain Occ palpitations--feels fast due to SOB Has had PND lately  weight up 11# in 3 weeks he was unaware of this  AAA repaired in past Has noted leg swelling does use salt substitute  Allergies: No Known Drug Allergies  Past History:  Past medical, surgical, family and social histories (including risk factors) reviewed for relevance to current acute and chronic problems.  Past Medical History: Reviewed history from 11/23/2009 and no changes required. HYPOTHYROIDISM (ICD-244.9)    - TSH  .276 12/209/10 HYPERTENSION (ICD-401.9) C O P D (ICD-496)                           - Dr Sherene Sires      - HFA 50% July 31, 2009 >   90% September 26, 2009       - PFT's September 26, 2009 FEV1 1.27 (38%) ratio 36 with 30% improvement  after B2, DLC0 35 corrects to 66 Right Pleural Calcified plaques s/p remote pna     - CT Chest 05/31/2010 Diabetes mellitus, type II Benign prostatic hypertrophy DJD Depression Coronary artery disease - non-obstructive by cath juen 2003 - Dr Myriam Jacobson Preston/card tremor  Past Surgical History: Reviewed history from 11/23/2009 and no changes required. Small Bowel Obstruction Repair with adhesiolysis hx of remote ileum resection due to bleeding Abdominal Aneurysm Repair s/p back surgury 1972,  lumbar laminectomy - Dr Fannie Knee s/p left femoral embolectomy with left leg ischemia - Dr Sena Slate s/p right ganglion cyst - Dr Teressa Senter  Family History: Reviewed history from 07/31/2009 and no changes required. Negative for respiratory diseases or atopy   Social History: Reviewed history from 11/23/2009 and no changes required. Patient states former smoker, quit in 2000 x36yrs, 2.5ppd.  Married Lives in the home with his wife Alcohol use-no Drug use-no 1 child  disabled veteran - sees [place] every 6 mo, for meds  Review of Systems       very slow urinary flow more dribbling appetite is not that great  Physical Exam  General:  alert.  ?slightly uncomfortable Mouth:  no erythema and no exudates.  Mild thrush on pharynx Neck:  supple and no masses.  ??slight increased JVP Lungs:  no intercostal retractions and no accessory muscle use.  Decreased breath sounds, esp at bases. No crackles, dullness or wheezing Heart:  normal rate, regular rhythm, no murmur, and no gallop.   Abdomen:  soft and non-tender.   Extremities:  trace edema   Impression & Recommendations:  Problem # 1:  DYSPNEA/SHORTNESS OF BREATH (ICD-786.09) Assessment New  history suggests CHF--I would guess diastolic can't exclude infectious cause but not as likely doesn't appear in distress  P: treat with lasix    z-pak just in case     ER if worsens     recheck on Monday-----will need CXR and probably echo  His updated medication list for this problem includes:    Spiriva Handihaler 18 Mcg Caps (Tiotropium bromide monohydrate) ..... Inhale contents of 1 cap daily    Diovan Hct 160-12.5 Mg Tabs (Valsartan-hydrochlorothiazide) .[name]... Take 1 tablet by mouth once a day    Proventil Hfa 108 (90 Base) Mcg/act Aers (Albuterol sulfate) .[name]... 2 puffs every 4 hours as needed    Symbicort 160-4.5 Mcg/act Aero (Budesonide-formoterol fumarate) .[name]... 2 puffs first thing  in am and 2 puffs again in pm about 12 hours later     Furosemide 40 Mg Tabs (Furosemide) .[name]... 1 tab daily as directed for increased fluid  Complete Medication List: 1)  Spiriva Handihaler 18 Mcg Caps (Tiotropium bromide monohydrate) .... Inhale contents of 1 cap daily 2)  Centrum Silver Tabs (Multiple vitamins-minerals) .[name].. 1 once daily 3)  Gemfibrozil 600 Mg Tabs (Gemfibrozil) .[name].. 1 tab by mouth  every morning and at bedtime 4)  Crestor 20 Mg Tabs (Rosuvastatin calcium) .... 1/2 at bedtime 5)  Levothyroxine Sodium 100 Mcg Tabs (Levothyroxine sodium) .... One daily 6)  Levothyroxine Sodium 112 Mcg Tabs (Levothyroxine sodium) .[name].. 1 by mouth daily 7)  Gabapentin 800 Mg Tabs (Gabapentin) .[name].. 1 three times a day 8)  Diovan Hct 160-12.5 Mg Tabs (Valsartan-hydrochlorothiazide) .... Take 1 tablet by mouth once a day 9)  Requip 1 Mg Tabs (Ropinirole hcl) .... Take 1 tab by mouth at bedtime 10)  Fluoxetine Hcl 20 Mg Tabs (Fluoxetine hcl) .... 2 by mouth daily 11)  Finasteride 5 Mg Tabs (Finasteride) .... Take 1 tab by mouth at bedtime 12)  Flomax 0.4 Mg Caps (Tamsulosin hcl) .... Take 1 tab by mouth at bedtime 13)  Glipizide 10 Mg Tabs (Glipizide) .[name].. 1 by mouth two times a day 14)  Metformin Hcl 1000 Mg Tabs (Metformin hcl) .... One twice daily 15)  Aspirin 81 Mg Tbec (Aspirin) .[name].. 1 by mouth daily 16)  Lantus 100 Unit/ml Soln (Insulin glargine) .... 75 units subcutaneously once daily 17)  Clonazepam 1 Mg Tabs (Clonazepam) .... Take 1 tab by mouth at bedtime 18)  Proventil Hfa 108 (90 Base) Mcg/act Aers (Albuterol sulfate) .... 2 puffs every 4 hours as needed 19)  Mucinex Dm 30-600 Mg Xr12h-tab (Dextromethorphan-guaifenesin) .... Take 1-2 tablets every 12 hours as needed 20)  Zyrtec Allergy 10 Mg Tabs (Cetirizine hcl) .... Take 1 tab by mouth at bedtime as needed 21)  Symbicort 160-4.5 Mcg/act Aero (Budesonide-formoterol fumarate) .... 2 puffs first thing  in am and 2 puffs again in pm about 12 hours later 22)  Clotrimazole 10 Mg Troc  (Clotrimazole) .... One 4 x daily as needed for throat irritation 23)  Home O2 - 2l At Rest, 4l With Exertion  24)  Furosemide 40 Mg Tabs (Furosemide) .[name].. 1 tab daily as directed for increased fluid 25)  Azithromycin 250 Mg Tabs (Azithromycin) .... 2 tabs today, then 1 tab daily for the next 4 days  Patient Instructions: 1)  Please take 1 furosemide as soon as you get home. If you don't have brisk response of voiding, please take 2 at the same time 2-3 hours later. Please continue daily at the dose that works 2)  Please schedule follow  up on Monday (with Dr Jonny Ruiz or other [facility] doctor) 3)  Please call 911 or have someone take you to ER if worsening Prescriptions: AZITHROMYCIN 250 MG TABS (AZITHROMYCIN) 2 tabs today, then 1 tab daily for the next 4 days  #6 x 0   Entered and Authorized by:   Cindee Salt MD   Signed by:   Cindee Salt MD on 12/08/2009   Method used:   Electronically to        [facility] [phone]* (retail)       [address]       [place], [place]  [address]       Ph: [phone]       Fax: [phone]   RxID:   [id number] FUROSEMIDE 40 MG TABS (FUROSEMIDE) 1 tab daily as directed for increased fluid  #30 x 0   Entered and Authorized by:   Cindee Salt MD   Signed by:   Cindee Salt MD on 12/08/2009   Method used:   Electronically to        [facility] [phone]* (retail)       [address]       [place], [place]  [address]       Ph: [phone]       Fax: [phone]   RxID:   [id number]

## 2010-08-29 NOTE — Assessment & Plan Note (Signed)
Summary: Pulmonary/ ext f/u ov, thrush rx clortrimazole   Primary Provider/Referring Provider:  Dr. Eula Listen  CC:  6 week follow-up.  The patient states there is no change in his breathing.  He does c/o occasional hoarseness and clearing of the throat.[name]  History of Present Illness: 65  yowm quit smoking around 2000   July 31, 2009 cc sob at rest since June 2010 while of ace and high doses of propranonol seen in consutation 07/25/2009 rec no ace, d/c propranolo > reports  no better from hospitalization and on  02 just at night with dry cough but no cp.  Thoroughly confused with instructions, still on ACE though I had strongly rec he stop these.  August 15, 2009--Returns for follow up and med review. Last visit, Ace was stopped (again). He returns today feeling much better. Only using O2 intermittently, "afraid he will get dependent on this", We discussed how O2 works and problems with desaturations. He desats in the upper 70 with activity and at rest he averages 87-90% at rest. He is on mulitple meds. Gets most of meds via [place] system. He does have insurance as well.  September 26, 2009 Followup with PFT's.  Pt states breathing is the same which represents a marked improvement over baseline with no new complaints today. on 4lpm can walk flat fine as long as he paces himself with no sign variability. rec symbicort   November 06, 2009 cc  no change in his breathing.  He does c/o occasional hoarseness and clearing of the throat.cc legs give out before breathing which is much better.  only uses proventil a couple times a week.  no purulent sputum or noct co's.  Pt denies any significant sore throat, dysphagia, itching, sneezing,  nasal congestion or excess secretions,  fever, chills, sweats, unintended wt loss, pleuritic or exertional cp, hempoptysis, change in activity tolerance  orthopnea pnd or leg swelling. Pt also denies any obvious fluctuation in symptoms with weather or environmental change or other  alleviating or aggravating factors.            Current Medications (verified): 1)  Spiriva Handihaler 18 Mcg Caps (Tiotropium Bromide Monohydrate) .... Inhale Contents of 1 Cap Daily 2)  Centrum Silver  Tabs (Multiple Vitamins-Minerals) .[name].. 1 Once Daily 3)  Gemfibrozil 600 Mg Tabs (Gemfibrozil) .[name].. 1 Tab By Mouth  Every Morning and At Bedtime 4)  Crestor 20 Mg Tabs (Rosuvastatin Calcium) .... 1/2 At Bedtime 5)  Levothyroxine Sodium 100 Mcg Tabs (Levothyroxine Sodium) .... One Daily 6)  Levothyroxine Sodium 112 Mcg Tabs (Levothyroxine Sodium) .[name].. 1 By Mouth Daily 7)  Gabapentin 800 Mg Tabs (Gabapentin) .[name].. 1 Three Times A Day 8)  Diovan Hct 160-12.5 Mg Tabs (Valsartan-Hydrochlorothiazide) .... Take 1 Tablet By Mouth Once A Day 9)  Requip 1 Mg Tabs (Ropinirole Hcl) .... Take 1 Tab By Mouth At Bedtime 10)  Fluoxetine Hcl 20 Mg Tabs (Fluoxetine Hcl) .... 2 By Mouth Daily 11)  Finasteride 5 Mg Tabs (Finasteride) .... Take 1 Tab By Mouth At Bedtime 12)  Flomax 0.4 Mg Caps (Tamsulosin Hcl) .... Take 1 Tab By Mouth At Bedtime 13)  Glipizide 10 Mg Tabs (Glipizide) .[name].. 1 By Mouth Two Times A Day 14)  Metformin Hcl 1000 Mg Tabs (Metformin Hcl) .... One Twice Daily 15)  Aspirin 81 Mg Tbec (Aspirin) .[name].. 1 By Mouth Daily 16)  Lantus 100 Unit/ml Soln (Insulin Glargine) .... As Directed 17)  Clonazepam 1 Mg Tabs (Clonazepam) .... Take 1  Tab By Mouth At Bedtime 18)  Proventil Hfa 108 (90 Base) Mcg/act Aers (Albuterol Sulfate) .... 2 Puffs Every 4 Hours As Needed 19)  Mucinex Dm 30-600 Mg Xr12h-Tab (Dextromethorphan-Guaifenesin) .... Take 1-2 Tablets Every 12 Hours As Needed 20)  Zyrtec Allergy 10 Mg Tabs (Cetirizine Hcl) .... Take 1 Tab By Mouth At Bedtime As Needed 21)  Symbicort 160-4.5 Mcg/act  Aero (Budesonide-Formoterol Fumarate) .... 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  The patient states there is no change in his breathing.  He does c/o occasional hoarseness and clearing of the throat. Comments Medications reviewed with the patient. Daytime phone number verified. Michel Bickers [facility]  November 06, 2009 9:04 AM   Physical Exam  Additional Exam:  obese amb hoarse wm nad on 02 wt  254  September 26, 2009 > 257 November 06, 2009  HEENT mild turbinate edema.  partial top denture, full bottom dentureOropharynx pos thrush or excess pnd or cobblestoning.  No JVD or cervical adenopathy. Mild accessory muscle hypertrophy. Trachea midline, nl thryroid. Chest was hyperinflated by percussion with diminished breath sounds and moderate increased exp time without wheeze. Hoover sign positive at mid inspiration. Regular rate and rhythm without murmur gallop or rub or increase P2 or edema.  Abd: no hsm, nl excursion. Ext warm without cyanosis or clubbing.     Impression & Recommendations:  Problem # 1:  C O P D (ICD-496) I spent extra time with the patient today explaining optimal mdi  technique.  This improved from  90-100%   Each maintenance medication was reviewed in detail including most importantly  the difference between maintenance and as needed and under what circumstances the prns are to be used. This was done in the context of a medication calendar review which provided the patient with a user-friendly unambiguous mechanism for medication administration and reconciliation and provides an action plan for all active problems. It is critical that this be shown to every doctor  for modification during the office visit if necessary so the patient can use it as a working document. See instructions for specific recommendations   Problem # 2:  THRUSH (ICD-112.0)  Should do better with hfa technique and rinsing, in  meantime use clotrimazole as needed.  Orders: Est. Patient Level III ([address])  Medications Added to Medication List This Visit: 1)  Fluoxetine Hcl 20 Mg Tabs (Fluoxetine hcl) .... 2 by mouth daily 2)  Clotrimazole 10 Mg Troc (Clotrimazole) .... One 4 x daily as needed for throat irritation  Other Orders: HFA Instruction [phone])  Patient Instructions: 1)  See calendar for specific medication instructions and bring it back for each and every office visit for every healthcare provider you see.  Without it,  you may not receive the best quality medical care that we feel you deserve.  2)  rinse and gargle with water  after use of inhalers 3)  if throat irritated, use clomtrimazole lozenges 4)  Return to office in 3 months, sooner if needed  Prescriptions: CLOTRIMAZOLE 10 MG TROC (CLOTRIMAZOLE) one 4 x daily as needed for throat irritation  #12 x 11   Entered and Authorized by:   Nyoka Cowden MD   Signed by:   Nyoka Cowden MD on 11/06/2009   Method used:   Electronically to        [facility]. [phone]* (retail)       [address]       [place], [place]  [address]       Ph: [phone]       Fax: [phone]   RxID:   [id number]

## 2010-08-29 NOTE — Progress Notes (Signed)
Summary: OV TODAY  Phone Note Call from Patient Call back at [facility] Phone [phone]   Summary of Call: Pt left vm on triage, c/o breathing difficulties. Spoke w/pt's wife. Pt c/o increase sob from respiratory symptoms. He requested zpak, advised needs office visit for eval first. Pt scheduled to today. Will go to ER w/severe symptoms or change in symptoms. Otherwise will continue oxygen & other respiratory meds untill office visit.  Initial call taken by: Lamar Sprinkles, CMA,  March 01, 2010 11:27 AM  Follow-up for Phone Call        noted Follow-up by: Corwin Levins MD,  March 01, 2010 11:35 AM

## 2010-08-29 NOTE — Procedures (Signed)
Summary: [facility]  [facility]   Imported By: Lester Monument 09/24/2009 08:35:36  _____________________________________________________________________  External Attachment:    Type:   Image     Comment:   External Document

## 2010-08-29 NOTE — Progress Notes (Signed)
Summary: switch DME's   [id number]  Phone Note Call from Patient Call back at Home Phone [phone]   Caller: Patient Call For: wert Reason for Call: Talk to Nurse Summary of Call: wants to switch oxygen carrier.  Would like to speak to someone about doing this.  Was told he would have to call out office. Initial call taken by: Eugene Gavia,  March 18, 2010 8:48 AM  Follow-up for Phone Call        [id number].  Aundra Millet Reynolds LPN  March 18, 2010 9:18 AM   pt returned our call. pt states he is currently getting oxygen through [facility].  Pt would like to switch to [facility].  Pt states his ins is Medicare and BC[employer] PPO.  Pt states wife is with [employer] and they would like to be dealing with just one company.  will forward message to [facility] to address.  Aundra Millet Reynolds LPN  March 18, 2010 9:38 AM    Additional Follow-up for Phone Call Additional follow up Details #1::        faxed order to [facility] to switch his 02 over to them and order given to [facility] to dc 02 pt is aware  Additional Follow-up by: Oneita Jolly,  March 18, 2010 10:13 AM

## 2010-08-29 NOTE — Assessment & Plan Note (Signed)
Summary: NP follow up - med calendar   Primary Provider/Referring Provider:  Dr. Eula Listen  CC:  new med calendar - pt brought all meds to OV.  no complaints today.  History of Present Illness: 14 yowm quit smoking around 2000   July 31, 2009 cc sob at rest since June 2010 while of ace and high doses of propranonol seen in consutation 07/25/2009 rec no ace, d/c propranolo > reports  no better from hospitalization and on  02 just at night with dry cough but no cp.  Thoroughly confused with instructions, still on ACE though I had strongly rec he stop these.  August 15, 2009--Returns for follow up and med review. Last visit, Ace was stopped (again). He returns today feeling much better. Only using O2 intermittently, "afraid he will get dependent on this", We discussed how O2 works and problems with desaturations. He desats in the upper 70 with activity and at rest he averages 87-90% at rest. He is on mulitple meds. Gets most of meds via [place] system. He does have insurance as well. Denies chest pain,   orthopnea, hemoptysis, fever, n/v/d, edema, headache.      Medications Prior to Update: 1)  Spiriva Handihaler 18 Mcg Caps (Tiotropium Bromide Monohydrate) .... Inhale Contents of 1 Cap Daily 2)  Centrum Silver  Tabs (Multiple Vitamins-Minerals) .[name].. 1 Once Daily 3)  Levothyroxine Sodium 100 Mcg Tabs (Levothyroxine Sodium) .... One Daily 4)  Levothyroxine Sodium 112 Mcg Tabs (Levothyroxine Sodium) .[name].. 1 By Mouth Daily 5)  Gabapentin 800 Mg Tabs (Gabapentin) .[name].. 1 Three Times A Day 6)  Diovan 160 Mg  Tabs (Valsartan) .... One By Mouth Daily 7)  Requip 1 Mg Tabs (Ropinirole Hcl) .[name].. 1 By Mouth Daily 8)  Fluoxetine Hcl 20 Mg Tabs (Fluoxetine Hcl) .... 2 By Mouth Daily 9)  Finasteride 5 Mg Tabs (Finasteride) .[name].. 1 By Mouth Daily 10)  Flomax 0.4 Mg Caps (Tamsulosin Hcl) .[name].. 1 Once Daily 11)  Glipizide 10 Mg Tabs (Glipizide) .[name].. 1 By Mouth Two Times A Day 12)  Metformin Hcl 1000 Mg Tabs  (Metformin Hcl) .... One Twice Daily 13)  Aspirin 81 Mg Tbec (Aspirin) .[name].. 1 By Mouth Daily 14)  Novolin N 100 Unit/ml Susp (Insulin Isophane Human) .... As Directed 15)  Proventil Hfa 108 (90 Base) Mcg/act Aers (Albuterol Sulfate) .... 2 Puffs Every 4 Hours As Needed 16)  Neurontin 300 Mg Caps (Gabapentin) .[name].. 1 By Mouth Three Times A Day  Current Medications (verified): 1)  Spiriva Handihaler 18 Mcg Caps (Tiotropium Bromide Monohydrate) .... Inhale Contents of 1 Cap Daily 2)  Centrum Silver  Tabs (Multiple Vitamins-Minerals) .[name].. 1 Once Daily 3)  Gemfibrozil 600 Mg Tabs (Gemfibrozil) .[name].. 1 Tab By Mouth  Every Morning and At Bedtime 4)  Simvastatin 80 Mg Tabs (Simvastatin) .... 1/2 Tab By Mouth At Bedtime 5)  Levothyroxine Sodium 100 Mcg Tabs (Levothyroxine Sodium) .... One Daily 6)  Levothyroxine Sodium 112 Mcg Tabs (Levothyroxine Sodium) .[name].. 1 By Mouth Daily 7)  Gabapentin 800 Mg Tabs (Gabapentin) .[name].. 1 Three Times A Day 8)  Diovan Hct 160-12.5 Mg Tabs (Valsartan-Hydrochlorothiazide) .... Take 1 Tablet By Mouth Once A Day 9)  Requip 1 Mg Tabs (Ropinirole Hcl) .... Take 1 Tab By Mouth At Bedtime 10)  Fluoxetine Hcl 20 Mg Tabs (Fluoxetine Hcl) .[name].. 1 By Mouth Daily 11)  Finasteride 5 Mg Tabs (Finasteride) .... Take 1 Tab By Mouth At Bedtime 12)  Flomax 0.4 Mg Caps (Tamsulosin Hcl) .Marland KitchenMarland KitchenMarland Kitchen  Take 1 Tab By Mouth At Bedtime 13)  Glipizide 10 Mg Tabs (Glipizide) .[name].. 1 By Mouth Two Times A Day 14)  Metformin Hcl 1000 Mg Tabs (Metformin Hcl) .... One Twice Daily 15)  Aspirin 81 Mg Tbec (Aspirin) .[name].. 1 By Mouth Daily 16)  Novolin N 100 Unit/ml Susp (Insulin Isophane Human) .... 50 Units At Bedtime 17)  Clonazepam 1 Mg Tabs (Clonazepam) .... Take 1 Tab By Mouth At Bedtime 18)  Proventil Hfa 108 (90 Base) Mcg/act Aers (Albuterol Sulfate) .... 2 Puffs Every 4 Hours As Needed 19)  Mucinex Dm 30-600 Mg Xr12h-Tab (Dextromethorphan-Guaifenesin) .... Take 1-2 Tablets Every 12 Hours As Needed 20)   Zyrtec Allergy 10 Mg Tabs (Cetirizine Hcl) .... 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  Married Lives in the home with his wife  Risk Factors: Smoking Status: quit (07/30/2009)  Social History: Patient states former smoker, quit in 2000 x55yrs, 2.5ppd.  Married Lives in the home with his wife  Review of Systems      See HPI  Vital Signs:  Patient profile:   70 year old male Height:      75 inches Weight:      250.25 pounds BMI:     31.39 O2 Sat:      90 % on Room air Temp:     97.6 degrees F oral Pulse rate:   70 / minute BP sitting:   140 / 70  (right arm) Cuff size:   regular  Vitals Entered By: [name] CNA (August 15, 2009 9:01 AM)  O2 Flow:  Room air CC: new med calendar - pt brought all meds to OV.  no complaints today Is Patient Diabetic? Yes Comments Medications reviewed with patient Daytime contact number verified with patient. [name] CNA  August 15, 2009 9:02 AM    Physical Exam  Additional Exam:  obese male.  HEENT mild turbinate edema.  Oropharynx no thrush or excess pnd or cobblestoning.  No JVD or cervical adenopathy. Mild accessory muscle hypertrophy. Trachea midline, nl thryroid. Chest was hyperinflated by percussion with diminished breath sounds and moderate increased exp time without wheeze. Hoover sign positive at mid  inspiration. Regular rate and rhythm without murmur gallop or rub or increase P2 or edema.  Abd: no hsm, nl excursion. Ext warm without cyanosis or clubbing.     Impression & Recommendations:  Problem # 1:  C O P D (ICD-496) Will cont on same meds follow up 6 wks with PFTS Meds reviewed with pt education and computerized med calendar completed/adjusted.     Problem # 2:  RESPIRATORY FAILURE, CHRONIC (ICD-518.83)  advised on importance of O2.  O2 2 l/m rest , and 4 l/m activity.   Orders: Est. Patient Level IV (04540)  Medications Added to Medication List This Visit: 1)  Gemfibrozil 600 Mg Tabs (Gemfibrozil) .[name].. 1 tab  by mouth  every morning and at bedtime 2)  Simvastatin 80 Mg Tabs (Simvastatin) .... 1/2 tab by mouth at bedtime 3)  Diovan Hct 160-12.5 Mg Tabs (Valsartan-hydrochlorothiazide) .... Take 1 tablet by mouth once a day 4)  Requip 1 Mg Tabs (Ropinirole hcl) .... Take 1 tab by mouth at bedtime 5)  Fluoxetine Hcl 20 Mg Tabs (Fluoxetine hcl) .[name].. 1 by mouth daily 6)  Finasteride 5 Mg Tabs (Finasteride) .... Take 1 tab by mouth at bedtime 7)  Flomax 0.4 Mg Caps (Tamsulosin hcl) .... Take 1 tab by mouth at bedtime 8)  Novolin N 100 Unit/ml Susp (Insulin isophane human) .... 50 units at bedtime 9)  Clonazepam 1 Mg Tabs (Clonazepam) .... Take 1 tab by mouth at bedtime 10)  Mucinex Dm 30-600 Mg Xr12h-tab (Dextromethorphan-guaifenesin) .... Take 1-2 tablets every 12 hours as needed 11)  Zyrtec Allergy 10 Mg Tabs (Cetirizine hcl) .... Take 1 tab by mouth at bedtime as needed  Complete Medication List: 1)  Spiriva Handihaler 18 Mcg Caps (Tiotropium bromide monohydrate) .... Inhale contents of 1 cap daily 2)  Centrum Silver Tabs (Multiple vitamins-minerals) .[name].. 1 once daily 3)  Gemfibrozil 600 Mg Tabs (Gemfibrozil) .[name].. 1 tab by mouth  every morning and at bedtime 4)  Simvastatin 80 Mg Tabs (Simvastatin) .... 1/2 tab by mouth at bedtime 5)  Levothyroxine Sodium 100 Mcg Tabs  (Levothyroxine sodium) .... One daily 6)  Levothyroxine Sodium 112 Mcg Tabs (Levothyroxine sodium) .[name].. 1 by mouth daily 7)  Gabapentin 800 Mg Tabs (Gabapentin) .[name].. 1 three times a day 8)  Diovan Hct 160-12.5 Mg Tabs (Valsartan-hydrochlorothiazide) .... Take 1 tablet by mouth once a day 9)  Requip 1 Mg Tabs (Ropinirole hcl) .... Take 1 tab by mouth at bedtime 10)  Fluoxetine Hcl 20 Mg Tabs (Fluoxetine hcl) .[name].. 1 by mouth daily 11)  Finasteride 5 Mg Tabs (Finasteride) .... Take 1 tab by mouth at bedtime 12)  Flomax 0.4 Mg Caps (Tamsulosin hcl) .... Take 1 tab by mouth at bedtime 13)  Glipizide 10 Mg Tabs (Glipizide) .[name].. 1 by mouth two times a day 14)  Metformin Hcl 1000 Mg Tabs (Metformin hcl) .... One twice daily 15)  Aspirin 81 Mg Tbec (Aspirin) .[name].. 1 by mouth daily 16)  Novolin N 100 Unit/ml Susp (Insulin isophane human) .... 50 units at bedtime 17)  Clonazepam 1 Mg Tabs (Clonazepam) .... Take 1 tab by mouth at bedtime 18)  Proventil Hfa 108 (90 Base) Mcg/act Aers (Albuterol sulfate) .... 2 puffs every 4 hours as needed 19)  Mucinex Dm 30-600 Mg Xr12h-tab (Dextromethorphan-guaifenesin) .... Take 1-2 tablets every 12 hours as needed 20)  Zyrtec Allergy 10 Mg Tabs (Cetirizine hcl) .... Take 1 tab by mouth at bedtime as needed  Patient Instructions: 1)  Continue on same meds.  2)  follow up Dr. Sherene Sires in 6 weeks with PFTs  and as needed  3)  Bring med calender to each visit.  4)   Wear oxygen at all time at 2 l/m then increase 4 l/m with activity.     Immunization History:  Influenza Immunization History:    Influenza:  historical (05/28/2009)  Pneumovax Immunization History:    Pneumovax:  historical (05/28/2008)

## 2010-08-29 NOTE — Progress Notes (Signed)
Summary: liquid 02 ok   Phone Note Call from Patient   Caller: [email or web address]  Call For: Raunel Dimartino Summary of Call: pt want to be evaluated for liquid 02 if ok need order  Initial call taken by: Oneita Jolly,  September 11, 2009 2:53 PM  Follow-up for Phone Call        please advise. Carron Curie CMA  September 11, 2009 3:08 PM ok with me  Follow-up by: Nyoka Cowden MD,  September 11, 2009 3:19 PM  Additional Follow-up for Phone Call Additional follow up Details #1::        orde sent to [facility]. Carron Curie CMA  September 11, 2009 3:28 PM

## 2010-08-29 NOTE — Consult Note (Signed)
Summary: [facility]  [facility]   Imported By: Sherian Rein 03/25/2010 09:46:00  _____________________________________________________________________  External Attachment:    Type:   Image     Comment:   External Document

## 2010-08-29 NOTE — Assessment & Plan Note (Signed)
Summary: Pulmonary/ ext f/u ov d/c ace teach mdi    Visit Type:  Hospital Follow-up Primary Provider/Referring Provider:  Dr. Eula Listen  CC:  Post hospital followup.  Pt states that he is still SOB- states no change since hospital discharge.  He states that he had prod cough 1 day ago with pink/grey colored sputum.  Also he c/o wheezing.[name]  History of Present Illness: 5 yowm quit smoking around 2000   July 31, 2009 cc sob at rest since June 2010 while of ace and high doses of propranonol seen in consutation 07/25/2009 rec no ace, d/c propranolo > reports  no better from hospitalization and on  02 just at night with dry cough but no cp.  Thoroughly confused with instructions, still on ACE though I had strongly rec he stop these.    Pt denies any significant sore throat, dysphagia, itching, sneezing,  nasal congestion or excess secretions,  fever, chills, sweats, unintended wt loss, pleuritic or exertional cp, hempoptysis, change in activity tolerance  orthopnea pnd or leg swelling Pt also denies any obvious fluctuation in symptoms with weather or environmental change or other alleviating or aggravating factors though better after albuterol  Current Medications (verified): 1)  Aspirin 81 Mg Tbec (Aspirin) .[name].. 1 By Mouth Daily 2)  Finasteride 5 Mg Tabs (Finasteride) .[name].. 1 By Mouth Daily 3)  Fluoxetine Hcl 20 Mg Tabs (Fluoxetine Hcl) .... 2 By Mouth Daily 4)  Neurontin 300 Mg Caps (Gabapentin) .[name].. 1 By Mouth Three Times A Day 5)  Gemfibrozil 600 Mg Tabs (Gemfibrozil) .[name].. 1 By Mouth Daily 6)  Glipizide 10 Mg Tabs (Glipizide) .[name].. 1 By Mouth Two Times A Day 7)  Levothyroxine Sodium 112 Mcg Tabs (Levothyroxine Sodium) .[name].. 1 By Mouth Daily 8)  Metformin Hcl 500 Mg Tabs (Metformin Hcl) .[name].. 1 By Mouth Two Times A Day 9)  Requip 1 Mg Tabs (Ropinirole Hcl) .[name].. 1 By Mouth Daily 10)  Novolin N 100 Unit/ml Susp (Insulin Isophane Human) .... As Directed 11)  Spiriva Handihaler 18 Mcg Caps  (Tiotropium Bromide Monohydrate) .... Inhale Contents of 1 Cap Daily 12)  Lisinopril 40 Mg Tabs (Lisinopril) .... 1/2 Once Daily 13)  Centrum Silver  Tabs (Multiple Vitamins-Minerals) .[name].. 1 Once Daily 14)  Proventil Hfa 108 (90 Base) Mcg/act Aers (Albuterol Sulfate) .... 2 Puffs Every 4 Hours As Needed 15)  Flomax 0.4 Mg Caps (Tamsulosin Hcl) .[name].. 1 Once Daily 16)  Gabapentin 800 Mg Tabs (Gabapentin) .[name].. 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     - CT Chest 05/31/2010 AODM  Family History: Negative for respiratory diseases or atopy   Vital Signs:  Patient profile:   70 year old male Height:      75 inches Weight:      261 pounds BMI:     32.74 O2 Sat:      93 % on 2 liters pulsed Temp:     98.2 degrees F oral Pulse rate:   85 / minute BP sitting:   152 / 84  (left arm)  Vitals Entered By: Vernie Murders (July 31, 2009 1:32 PM)  O2 Flow:  2 liters pulsed  Serial Vital Signs/Assessments:  Comments: 2:07 PM Ambulatory  Pulse Oximetry  Resting; HR__82___    02 Sat__92%2 liters o2 pulsed___  Lap1 (185 feet)   HR__84___   02 Sat__78%2 liters o2 pulsed___ Lap2 (185 feet)   HR_____   02 Sat_____    Lap3 (185 feet)   HR_____   02 Sat_____  ___Test Completed without Difficulty _x__Test Stopped due [id number] O2 sat and SOB.   By: Vernie Murders    Physical Exam  Additional Exam:  obese anxious wm nad pseudowheeze resolves with purse lip maneuver  HEENT mild turbinate edema.  Oropharynx no thrush or excess pnd or cobblestoning.  No JVD or cervical adenopathy. Mild accessory muscle hypertrophy. Trachea midline, nl thryroid. Chest was hyperinflated by percussion with diminished breath sounds and moderate increased exp time  without wheeze. Hoover sign positive at mid inspiration. Regular rate and rhythm without murmur gallop or rub or increase P2 or edema.  Abd: no hsm, nl excursion. Ext warm without cyanosis or clubbing.     Impression & Recommendations:  Problem # 1:  C O P D (ICD-496) When respiratory symptoms begin well after a patient reports complete smoking cessation,  it is very hard to "blame" COPD  ie it doesn't make any more sense than hearing a  [employer]SC[employer] driver wrecked his car while driving his kids to school or a Careers adviser sliced his hand off carving [place].  Once the high risk activity stops,  the symptoms should not suddenly erupt.  If so, the differential diagnosis should include  obesity/deconditioning,  LPR/Reflux, CHF, or side effect of medications   For now strongly suspect many of his symptoms are ace related, but of course this wouldn't explain the hypoxemia  I spent extra time with the patient today explaining optimal mdi  technique.  This improved from  25-50%  Problem # 2:  HYPERTENSION (ICD-401.9)  The following medications were removed from the medication list:    Bisoprolol Fumarate 5 Mg Tabs (Bisoprolol fumarate) .[name]... 1 by mouth daily    Furosemide 20 Mg Tabs (Furosemide) .[name]... 1 by mouth daily    Terazosin Hcl 5 Mg Caps (Terazosin hcl) .[name]... 1 by mouth daily    Losartan Potassium 100 Mg Tabs (Losartan potassium) .[name]... 1 by mouth daily    Lisinopril 40 Mg Tabs (Lisinopril) .[name]... 1/2 once daily His updated medication list for this problem includes:    Diovan 160 Mg Tabs (Valsartan) ..... One by mouth daily  Problem # 3:  HYPOTHYROIDISM (ICD-244.9) May need to be adjusted on next ov based on TSH  .276 07/25/09  His updated medication list for this problem includes:    Levothyroxine Sodium 112 Mcg Tabs (Levothyroxine sodium) .[name]... 1 by mouth daily    Levothyroxine Sodium 100 Mcg Tabs (Levothyroxine sodium) ..... One daily  Problem # 4:  RESPIRATORY FAILURE, CHRONIC (ICD-518.83) ct  reviewed from 05/31/09 has emphysema and restrictive changes, may need higher 02 with ex based on today's study  Medications Added to Medication List This Visit: 1)  Spiriva Handihaler 18 Mcg Caps (Tiotropium bromide monohydrate) .... Inhale contents of 1 cap daily 2)  Fluoxetine Hcl 20 Mg Tabs (Fluoxetine hcl) .... 2 by mouth daily 3)  Metformin Hcl 500 Mg Tabs (Metformin hcl) .[name].. 1 by mouth two times a day 4)  Levothyroxine Sodium 100 Mcg Tabs (Levothyroxine sodium) .... One daily 5)  Metformin Hcl 1000 Mg Tabs (Metformin hcl) .... One twice daily 6)  Flomax 0.4 Mg Caps (Tamsulosin hcl) .[name].. 1 once daily 7)  Lisinopril 40 Mg Tabs (  Lisinopril) .... 1/2 once daily 8)  Novolin N 100 Unit/ml Susp (Insulin isophane human) .... As directed 9)  Centrum Silver Tabs (Multiple vitamins-minerals) .[name].. 1 once daily 10)  Gabapentin 800 Mg Tabs (Gabapentin) .[name].. 1 three times a day 11)  Diovan 160 Mg Tabs (Valsartan) .... One by mouth daily 12)  Proventil Hfa 108 (90 Base) Mcg/act Aers (Albuterol sulfate) .... 2 puffs every 4 hours as needed  Other Orders: Est. Patient Level IV (25427) HFA Instruction (06237) Pulse Oximetry, Ambulatory (62831)  Patient Instructions: 1)  See Tammy NP w/in 2 weeks with all your medications, even over the counter meds, separated in two separate bags, the ones you take no matter what vs the ones you stop once you feel better and take only as needed.  She will generate for you a new user friendly medication calendar that will put [place] all on the same page re: your medication use.  2)  stop lisinopril 3)  Start Diovan 160 4)  use proventil if needed 5)  RECHECK PFT'S NEXT OV 6)  INCREASE 02 TO 4LPM WITH EXERCISE  Appended Document: Pulmonary/ ext f/u ov d/c ace teach mdi  COPY TO HUSAIN inform pt needs to increase 02 to 4 lpm with exertion and reduce back to 2lpm at rest for now  Appended Document: Pulmonary/ ext f/u ov d/c ace teach mdi  [id number]  Appended Document:  Pulmonary/ ext f/u ov d/c ace teach mdi  Pt notified of the above.

## 2010-08-29 NOTE — Assessment & Plan Note (Signed)
Summary: coughing up blood ///kp   Primary Provider/Referring Provider:  Dr. Eula Listen  CC:  ov - coughing up blood for 3 days - Bright red blood - Occas 1 teaspoon in quantity - Occs 3-4 times a day - c/o pain in upper abdomen last week - Has h/o anuerysm surgery 8 yrs ago - SOB is about the same - Denies fever.  History of Present Illness: 40  yowm quit smoking around 2000 with GOLD III COPD by PFT's 09/2009  July 31, 2009 cc sob at rest since June 2010 while of ace and high doses of propranonol seen in consutation 07/25/2009 rec no ace, d/c propranolo > reports  no better from hospitalization and on  02 just at night with dry cough but no cp.  Thoroughly confused with instructions, still on ACE though I had strongly rec he stop these.  August 15, 2009--Returns for follow up and med review. Last visit, Ace was stopped (again). He returns today feeling much better. Only using O2 intermittently, "afraid he will get dependent on this", We discussed how O2 works and problems with desaturations. He desats in the upper 70 with activity and at rest he averages 87-90% at rest. He is on mulitple meds. Gets most of meds via [place] system. He does have insurance as well.  September 26, 2009 Followup with PFT's.  Pt states breathing is the same which represents a marked improvement over baseline with no new complaints today. on 4lpm can walk flat fine as long as he paces himself with no sign variability. rec symbicort   February 07, 2010 cc  breathing is "pretty good".   can even push a mower now for 30 min wihout sotpping using 02 4lpm with anything but very slow adls vs a year ago sob at rest.  sleeps ok without resp problems or am exac. rarely needs saba (maybe twice a week) no cough.  >>no changes   PAGE 2 >>>>>>>>>>>>>>>>>>>>>>>>>>>>>   May 07, 2010 --Presents for an acute office visit. Complains coughing up blood for 3 days - Bright red blood - Occas 1 teaspoon in quantity - Occs 3-4 times a day - c/o pain  in upper abdomen last week - Has h/o anuerysm surgery 8 yrs ago - SOB is about the same . [place] felt mucus in throat, when he coughed up, noticed bright red blood -that was 3 days ago. Has had on/off cough w/ blood tinged-bright red mucus mixed w/ blood -both at times -1/2 tsp in size. No recent weight loss, night sweat. NO leg swelling or calf pain. Does not take aspirin. Gets most meds from [place]. He feels fine, no change in breahting status.    Current Medications (verified): 1)  Spiriva Handihaler 18 Mcg Caps (Tiotropium Bromide Monohydrate) .... Inhale Contents of 1 Cap Daily 2)  Centrum Silver  Tabs (Multiple Vitamins-Minerals) .[name].. 1 Once Daily 3)  Crestor 20 Mg Tabs (Rosuvastatin Calcium) .... 1/2 At Bedtime 4)  Levothyroxine Sodium 100 Mcg Tabs (Levothyroxine Sodium) .... One Daily 5)  Levothyroxine Sodium 112 Mcg Tabs (Levothyroxine Sodium) .[name].. 1 By Mouth Daily 6)  Gabapentin 800 Mg Tabs (Gabapentin) .[name].. 1 Three Times A Day 7)  Losartan Potassium 100 Mg Tabs (Losartan Potassium) .... 1/2 Once Daily 8)  Requip 1 Mg Tabs (Ropinirole Hcl) .... Take 1 Tab By Mouth At Bedtime 9)  Fluoxetine Hcl 20 Mg Tabs (Fluoxetine Hcl) .[name].. 1 Tablet Two Times A Day 10)  Flomax 0.4 Mg Caps (Tamsulosin Hcl) .... Take 1 Tab By Mouth At Bedtime 11)  Glipizide 10 Mg Tabs (Glipizide) .[name].. 1 By Mouth Two Times A Day 12)  Metformin Hcl 1000 Mg Tabs (Metformin Hcl) .... One Twice Daily 13)  Lantus 100 Unit/ml Soln (Insulin Glargine) .... 74 Units Subcutaneously Once Daily 14)  Proventil Hfa 108 (90 Base) Mcg/act Aers (Albuterol Sulfate) .... 2 Puffs Every 4 Hours As Needed 15)  Mucinex Dm 30-600 Mg Xr12h-Tab (Dextromethorphan-Guaifenesin) .... Take 1-2 Tablets Every 12 Hours As Needed 16)  Symbicort 160-4.5 Mcg/act  Aero (Budesonide-Formoterol Fumarate) .... 2 Puffs First Thing  in Am and 2 Puffs Again in Pm About 12 Hours Later 17)  Clotrimazole 10 Mg Troc (Clotrimazole) .... One 4  X Daily As Needed For Throat Irritation 18)  Home O2  - 2l At Rest, 4l With Exertion 19)  Hydrochlorothiazide 25 Mg Tabs (Hydrochlorothiazide) .... 1/2 Once Daily As Needed 20)  Vitamin D3 2000 Unit Caps (Cholecalciferol) .[name].. 1 Once Daily 21)  Zolpidem Tartrate 10 Mg Tabs (Zolpidem Tartrate) .[name].. 1 At Bedtime As Needed 22)  Niacin Cr 500 Mg Cr-Caps (Niacin) .[name].. 1 Tablet Once Daily  Allergies (verified): No Known Drug Allergies  Comments:  Nurse/Medical Assistant: The patient's medications and allergies were reviewed with the patient and were updated in the Medication and Allergy Lists.  Past History:  Past Medical History: Last updated: 02/21/2010 HYPOTHYROIDISM (ICD-244.9)    - TSH  .276 12/209/10 HYPERTENSION (ICD-401.9) C O P D (ICD-496)...........................................................[name] Dr Sherene Sires      - HFA 50% July 31, 2009 >   90% September 26, 2009 > 90% February 07, 2010       - PFT's September 26, 2009 FEV1 1.27 (38%) ratio 36 with 30% improvement  after B2, DLC0 35 corrects to 66 Right Pleural Calcified plaques s/p remote pna     - CT Chest 05/31/2010 Diabetes mellitus, type II Benign prostatic hypertrophy DJD Depression Coronary artery disease - non-obstructive by cath juen 2003 - Dr Myriam Jacobson [place]/card tremor gait disturbance - Dr Terrace Arabia - ? 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  Married Lives in the home with his wife Alcohol use-no Drug use-no 1 child  disabled veteran - sees [place] every 6 mo, for meds  Review of Systems      See HPI  Vital Signs:  Patient  profile:   70 year old male Weight:      245.50 pounds O2 Sat:      91 % on 2 L/min Temp:     99 degrees F oral Pulse rate:   81 / minute BP sitting:   140 / 60  (left arm) Cuff size:   regular  Vitals Entered By: Abigail Miyamoto RN (May 07, 2010 1:56 PM)  O2 Flow:  2 L/min  Physical Exam  Additional Exam:  obese amb minimally  hoarse wm nad on 02 with minimal lip tremor wt  254  September 26, 2009 > 257 November 06, 2009 > 249 February 07, 2010 >>245 05/07/10  HEENT mild turbinate edema.  partial top denture, full bottom dentureOropharynx pos thrush  or excess pnd or cobblestoning.  No JVD or cervical adenopathy. Mild accessory muscle hypertrophy. Trachea midline, nl thryroid. Chest was hyperinflated by percussion with diminished breath sounds and moderate increased exp time without wheeze. Hoover sign positive at mid inspiration. Regular rate and rhythm without murmur gallop or rub or increase P2 or edema.  Abd: no hsm, nl excursion. Ext warm without cyanosis or clubbing.     Impression & Recommendations:  Problem # 1:  HEMOPTYSIS UNSPECIFIED (ICD-786.30) Painless hemoptysis ? etiology , May be mild COPD flare . CXR is unremarkable today.  check lab w/ D.Dimer. hx of dVT. NO symptoms today to suggest this but if D.Dimer is positive will consider CT chest to r/o PE>  Plan:  Avelox 400mg  once daily for 7 days follow up 1 week and as needed  Mucinex DM two times a day as needed  No aspirin for now.  I will call with lab report Please contact office for sooner follow up if symptoms do not improve or worsen  The following medications were removed from the medication list:    Azithromycin 250 Mg Tabs (Azithromycin) .[name]... 2po qd for 1 day, then 1po qd for 4days, then stop    Prednisone 10 Mg Tabs (Prednisone) .Marland KitchenMarland KitchenMarland KitchenMarland Kitchen 4po qd for 3days, then 3po qd for 3days, then 2po qd for 3days, then 1po qd for 3 days, then stop His updated medication list for this problem includes:    Spiriva Handihaler 18 Mcg  Caps (Tiotropium bromide monohydrate) ..... Inhale contents of 1 cap daily    Centrum Silver Tabs (Multiple vitamins-minerals) .[name]... 1 once daily    Proventil Hfa 108 (90 Base) Mcg/act Aers (Albuterol sulfate) .[name]... 2 puffs every 4 hours as needed    Symbicort 160-4.5 Mcg/act Aero (Budesonide-formoterol fumarate) .[name]... 2 puffs first thing  in am and 2 puffs again in pm about 12 hours later    Vitamin D3 2000 Unit Caps (Cholecalciferol) .[name]... 1 once daily    Niacin Cr 500 Mg Cr-caps (Niacin) .[name]... 1 tablet once daily    Avelox 400 Mg Tabs (Moxifloxacin hcl) .[name]... 1 by mouth once daily  Medications Added to Medication List This Visit: 1)  Lantus 100 Unit/ml Soln (Insulin glargine) .... 74 units subcutaneously once daily 2)  Hydrochlorothiazide 25 Mg Tabs (Hydrochlorothiazide) .... 1/2 once daily as needed 3)  Levothyroxine Sodium 200 Mcg Tabs (Levothyroxine sodium) .[name].. 1 by mouth once daily 4)  Avelox 400 Mg Tabs (Moxifloxacin hcl) .[name].. 1 by mouth once daily  Complete Medication List: 1)  Spiriva Handihaler 18 Mcg Caps (Tiotropium bromide monohydrate) .... Inhale contents of 1 cap daily 2)  Centrum Silver Tabs (Multiple vitamins-minerals) .[name].. 1 once daily 3)  Crestor 20 Mg Tabs (Rosuvastatin calcium) .... 1/2 at bedtime 4)  Gabapentin 800 Mg Tabs (Gabapentin) .[name].. 1 three times a day 5)  Losartan Potassium 100 Mg Tabs (Losartan potassium) .... 1/2 once daily 6)  Requip 1 Mg Tabs (Ropinirole hcl) .... Take 1 tab by mouth at bedtime 7)  Fluoxetine Hcl 20 Mg Tabs (Fluoxetine hcl) .[name].. 1 tablet two times a day 8)  Flomax 0.4 Mg Caps (Tamsulosin hcl) .... Take 1 tab by mouth at bedtime 9)  Glipizide 10 Mg Tabs (Glipizide) .[name].. 1 by mouth two times a day 10)  Metformin Hcl 1000 Mg Tabs (Metformin hcl) .... One twice daily 11)  Lantus 100 Unit/ml Soln (Insulin glargine) .... 74 units subcutaneously once daily 12)  Proventil Hfa 108 (90 Base) Mcg/act Aers (Albuterol sulfate) .Marland KitchenMarland KitchenMarland Kitchen  2 puffs every 4  hours as needed 13)  Mucinex Dm 30-600 Mg Xr12h-tab (Dextromethorphan-guaifenesin) .... Take 1-2 tablets every 12 hours as needed 14)  Symbicort 160-4.5 Mcg/act Aero (Budesonide-formoterol fumarate) .... 2 puffs first thing  in am and 2 puffs again in pm about 12 hours later 15)  Clotrimazole 10 Mg Troc (Clotrimazole) .... One 4 x daily as needed for throat irritation 16)  Home O2 - 2l At Rest, 4l With Exertion  17)  Hydrochlorothiazide 25 Mg Tabs (Hydrochlorothiazide) .... 1/2 once daily as needed 18)  Vitamin D3 2000 Unit Caps (Cholecalciferol) .[name].. 1 once daily 19)  Zolpidem Tartrate 10 Mg Tabs (Zolpidem tartrate) .[name].. 1 at bedtime as needed 20)  Niacin Cr 500 Mg Cr-caps (Niacin) .[name].. 1 tablet once daily 21)  Levothyroxine Sodium 200 Mcg Tabs (Levothyroxine sodium) .[name].. 1 by mouth once daily 22)  Avelox 400 Mg Tabs (Moxifloxacin hcl) .[name].. 1 by mouth once daily  Other Orders: T-D-Dimer Fibrin Derivatives Quantitive [id number]) T-2 View CXR (71020TC) TLB-CBC Platelet - w/Differential (85025-CBCD) TLB-BMP (Basic Metabolic Panel-BMET) (80048-METABOL) Est. Patient Level IV (09811)  Patient Instructions: 1)  Avelox 400mg  once daily for 7 days 2)  follow up 1 week and as needed  3)  Mucinex DM two times a day as needed  4)  No aspirin for now.  5)  I will call with lab report 6)  Please contact office for sooner follow up if symptoms do not improve or worsen  Prescriptions: AVELOX 400 MG TABS (MOXIFLOXACIN HCL) 1 by mouth once daily  #7 x 0   Entered and Authorized by:   Rubye Oaks NP   Signed by:   Rubye Oaks NP on 05/07/2010   Method used:   Electronically to        [facility]. [phone]* (retail)       [address]       [place], [place]  [address]       Ph: [phone]       Fax: [phone]   RxID:   [id number]

## 2010-08-29 NOTE — Assessment & Plan Note (Signed)
Summary: office visit to re-qualify for o2 thru [facility]   CC:  pt here to have walking oximetry to qualify for o2 through [facility] - is changing DME companies from [facility] to [employer].  Allergies: No Known Drug Allergies  Vital Signs:  Patient profile:   70 year old male Height:      74.5 inches Weight:      247.13 pounds BMI:     31.42 O2 Sat:      92 % on 3 L/min cont  Vitals Entered By: [name] CNA/MA (March 26, 2010 3:34 PM)  O2 Flow:  3 L/min cont CC: pt here to have walking oximetry to qualify for o2 through [facility] - is changing DME companies from [facility] to [facility] Comments Ambulatory Pulse Oximetry  Resting; HR__78___    02 Sat__90ra__  Lap1 (185 feet)   HR__82___   02 Sat__86ra__; pt walked approximately another 74feet to get to a chair and oxygen level decreased to 80% ra.  immediately placed patient on 3L cont oxygen and allowed patient to rest x2 minutes.  O2 sat increased to 92% 3L. Lap2 (185 feet)   HR_____   02 Sat_____    Lap3 (185 feet)   HR_____   02 Sat_____  ___Test Completed without Difficulty ___Test Stopped due to: decreased o2 sats  [name] CNA/MA  March 26, 2010 3:35 PM     Other Orders: No Charge Patient Arrived (NCPA0) (NCPA0)

## 2010-09-03 ENCOUNTER — Encounter (HOSPITAL_COMMUNITY): Payer: Medicare Other | Attending: Internal Medicine

## 2010-09-03 DIAGNOSIS — I10 Essential (primary) hypertension: Secondary | ICD-10-CM | POA: Insufficient documentation

## 2010-09-03 DIAGNOSIS — J4489 Other specified chronic obstructive pulmonary disease: Secondary | ICD-10-CM | POA: Insufficient documentation

## 2010-09-03 DIAGNOSIS — Z5189 Encounter for other specified aftercare: Secondary | ICD-10-CM | POA: Insufficient documentation

## 2010-09-03 DIAGNOSIS — E119 Type 2 diabetes mellitus without complications: Secondary | ICD-10-CM | POA: Insufficient documentation

## 2010-09-03 DIAGNOSIS — J8410 Pulmonary fibrosis, unspecified: Secondary | ICD-10-CM | POA: Insufficient documentation

## 2010-09-03 DIAGNOSIS — E039 Hypothyroidism, unspecified: Secondary | ICD-10-CM | POA: Insufficient documentation

## 2010-09-03 DIAGNOSIS — J9610 Chronic respiratory failure, unspecified whether with hypoxia or hypercapnia: Secondary | ICD-10-CM | POA: Insufficient documentation

## 2010-09-03 DIAGNOSIS — I2510 Atherosclerotic heart disease of native coronary artery without angina pectoris: Secondary | ICD-10-CM | POA: Insufficient documentation

## 2010-09-03 DIAGNOSIS — J449 Chronic obstructive pulmonary disease, unspecified: Secondary | ICD-10-CM | POA: Insufficient documentation

## 2010-09-04 ENCOUNTER — Telehealth: Payer: Self-pay | Admitting: Internal Medicine

## 2010-09-04 NOTE — Assessment & Plan Note (Signed)
Summary: cough/sob//td   Primary Provider/Referring Provider:  Dr. [name] - PMD, Dr. Marchelle Gearing - Pulmonary, Rheumatologist at [facility] - name not known   CC:  Acute Visit. pt c/o increased sob, dry cough, and wheezing. Pt states he doesn't feel like he is getting enough oxygen .  History of Present Illness: 70  yowm quit smoking around 2000 with GOLD III COPD by PFT's 09/2009. Meds through [facility]  June 18, 2010: EX 100 pack year smoker. Home O2 dependent Gold stage 3 COPD with low DLCO 35%  in PFTs MArch 2011. BAeline pulse ox is 87-93% on 2L [place]. In,  OCt 2011 Ct showing some UIP component but predmoninant COPD on CT. Transferring care from Dr. Sherene Sires to myself.  In Oct 2011 had some mild AECOPD related hemoptysis with CT showing only UIP but not showing any mass. Since then hemoptysis resolved. Currently feels baseline on 2-3L [place]. Claudication lmits him more than dyspnea at 1/4 th mile. Denies chronic cough but on mucinex and feels it helps. On spiriva, foradil and asmanex through [facility]. Has not done rehab though wife is attending rehab. Denies chest pains, edema, wheezing, fever, colored sputum. REC: CHECK AUTOIMMUNE PROFILE,  START NAC, GET FULL PFTs   January 69, 2012: Followup. in interim was admitted for sBP12/19/2011 due to [facility] from prior laparotomies. Resolved with conservative Rx. Started pulmnary rehab 2 weeks ago. Attend 4 sesssions. It is helping so far and he likes it. No new complaints. Dyspnea is stable class 2-3. Denies associated cough, chest pain, hemoptysis, edema, sputum, weight loss. Had autoimmune profile 07/03/2010 - CRP 10, RF 352 but otherwise negative. Upon revealing labs he states he is known to have RA diagnosed over 10 years ago and was on enbrel. He is due to see a rheumatologist at [place], [place]. PFTs today continue to show Gold stage 3 COPD and somewhat improved from a year ago.  January 69, 2012 --Presents for an acute office visit.Complains  of increased sob,  dry cough, wheezing. Pt states he doesn't feel like he is getting enough oxygen -cant get enough air. Retaining more fluid. Denies chest pain, orthopnea, hemoptysis, fever, n/v/d,  headache,recent travel.     Current Medications (verified): 1)  Spiriva Handihaler 18 Mcg Caps (Tiotropium Bromide Monohydrate) .... Inhale Contents of 1 Cap Daily 2)  Foradil Aerolizer 12 Mcg Caps (Formoterol Fumarate) .... Inhale Contents of 1 Capsule Two Times A Day 3)  Asmanex 60 Metered Doses 220 Mcg/inh Aepb (Mometasone Furoate) .... Inhale 2 Puffs By Mouth At Bedtime 4)  Simvastatin 20 Mg Tabs (Simvastatin) .... 1/4 By Mouth Once Daily 5)  Slo-Niacin 500 Mg Cr-Tabs (Niacin) .... Take 1 Tab By Mouth At Bedtime 6)  Gabapentin 800 Mg Tabs (Gabapentin) .[name].. 1 Tab By Mouth  Every Morning and At Bedtime 7)  Losartan Potassium 100 Mg Tabs (Losartan Potassium) .... 1/2 Once Daily 8)  Hydrochlorothiazide 25 Mg Tabs (Hydrochlorothiazide) .... 1/2 Tab By Mouth Once Daily 9)  Levothyroxine Sodium 200 Mcg Tabs (Levothyroxine Sodium) .[name].. 1 Tab By Mouth  Every Morning and At Bedtime 10)  Doxepin Hcl 50 Mg Caps (Doxepin Hcl) .... Take 1 Capsule By Mouth At Bedtime 11)  Doxepin Hcl 10 Mg Caps (Doxepin Hcl) .[name].. 1 Capsule By Mouth  Every Morning and in The Evening 12)  Flomax 0.4 Mg Caps (Tamsulosin Hcl) .... Take 1 Tab By Mouth At Bedtime 13)  Aspirin 81 Mg Tbec (Aspirin) .... Take 1 Tablet By Mouth Once A Day 14)  Metformin Hcl 1000 Mg Tabs (Metformin Hcl) .... One Twice Daily 15)  Novolog 100 Unit/ml Soln (Insulin Aspart) .... Sliding Scale As Needed With Meals 16)  Lantus 100 Unit/ml Soln (Insulin Glargine) .... 53 Units Subcutaneously Once Daily With Dinner 17)  Mega Multi Men  Cr-Tabs (Multiple Vitamins-Minerals) .... Take 1 Tablet By Mouth Once A Day 18)  Vitamin D3 2000 Unit Caps (Cholecalciferol) .[name].. 1 Once Daily 19)  Requip 1 Mg Tabs (Ropinirole Hcl) .... Take 1 Tab By Mouth At Bedtime 20)   Finasteride 5 Mg Tabs (Finasteride) .... Take 1 Tablet By Mouth Once A Day 21)  Oxygen .... Wear 2l/min Continuously 22)  Proair Hfa 108 (90 Base) Mcg/act Aers (Albuterol Sulfate) .... 2 Puffs Every 4 Hours As Needed 23)  Guaifenesin 400 Mg Tabs (Guaifenesin) .... 2tabs By Mouth Two Times A Day As Needed 24)  Zyrtec Allergy 10 Mg Tabs (Cetirizine Hcl) .... Take 1 Tab By Mouth At Bedtime As Needed 25)  Oxygen .... Increase To 4l/min With Activity As Needed 26)  Nac 600 600 Mg Caps (Acetylcysteine) .... Take 1 Tablet By Mouth Three Times A Day  Allergies (verified): No Known Drug Allergies  Past History:  Past Medical History: Last updated: 08/12/2010 HYPOTHYROIDISM (ICD-244.9)    - TSH  .276 12/209/10 HYPERTENSION (ICD-401.9) C O P D (ICD-496)...........................................................[name] Dr Sherene Sires      - HFA 50% July 31, 2009 >   90% September 26, 2009 > 90% February 07, 2010       - PFT's September 26, 2009 FEV1 1.27 (38%) ratio 36 with 30% improvement  after B2, DLC0 35 corrects to 66       -CT sinus neg.  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 6% BD respinse DLCO 43%, TLC 84% Diabetes mellitus, type II Benign prostatic hypertrophy DJD Depression Coronary artery disease - non-obstructive by cath juen 2003 - Dr Myriam [place]/card tremor gait disturbance - Dr Terrace Arabia - ? parkinsons complex med regimen--Meds reviewed with pt education and computerized med calendar 06/04/10 Rheumatoid Arthritis ....[facility]  - Diagnosed before 2000, Was on enbrel 1-2 years in 2001 LLE DVT per history  in 2000  - on coumadin 3-6 months per hx  Past Surgical History: Last updated: 11/23/2009 Small Bowel Obstruction Repair with adhesiolysis hx of remote ileum resection due to bleeding Abdominal Aneurysm Repair s/p back surgury 1972, lumbar laminectomy -  Dr Fannie Knee s/p left femoral embolectomy with left leg ischemia - Dr Sena Slate s/p right ganglion cyst - Dr Teressa Senter  Family History: Last updated: 07/31/2009 Negative for respiratory diseases or atopy   Social History: Last updated: 06/18/2010 Patient states former smoker, quit in 2000 x46yrs, 2.5ppd.  Married Lives in the home with his wife Alcohol use-no Drug use-no 1 child  disabled veteran - sees [place] every 6 mo, for meds Ex- [employer] Denies asbestos exposure  Risk Factors: Smoking Status: quit (08/12/2010) Packs/Day: 2.5 (08/12/2010)  Review of Systems      See HPI  Vital Signs:  Patient profile:   70 year old male Height:      74.5 inches Weight:      254.50 pounds BMI:     32.36 O2 Sat:      96 % on 2 L/min continous Temp:     96.3 degrees F oral  Pulse rate:   74 / minute BP sitting:   126 / 70  (left arm) Cuff size:   regular  Vitals Entered By: Carver Fila (August 27, 2010 11:15 AM)  O2 Flow:  2 L/min continous CC: Acute Visit. pt c/o increased sob,  dry cough, wheezing. Pt states he doesn't feel like he is getting enough oxygen  Comments meds and allergies updated Phone number updated Carver Fila  August 27, 2010 11:15 AM    Physical Exam  Additional Exam:  obese amb minimally  hoarse wm nad on 02 with minimal lip tremor wt  254  September 26, 2009 > 257 November 06, 2009 > 249 February 07, 2010 >>245 05/07/10 > 244 May 13, 2010 >>252 June 04, 2010 >2541/31/12 HEENT mild turbinate edema.  partial top denture, full bottom dentureOropharynx pos thrush or excess pnd or cobblestoning.  No JVD or cervical adenopathy. Mild accessory muscle hypertrophy. Trachea midline, nl thryroid. Chest was hyperinflated by percussion with diminished breath sounds in base, faint exp wheeze  Regular rate and rhythm without murmur gallop or rub or increase P2 , tr-1+ edema.  Abd: no hsm, nl excursion. Ext warm without cyanosis or clubbing.     Impression &  Recommendations:  Problem # 1:  CHRONIC OBSTRUCTIVE PULMONARY DISEASE, ACUTE EXACERBATION (ICD-491.21)  Flare with mild fluid retention Depo Medrol 120mg  IM  Plan: Prednisone taper over next week.  Increase Hydrochlorothiazide 25mg  once daily for 4 days then back to  1/2 once daily  (call back if this is not on your med list at home) Doxycycline 100mg  two times a day for 7days low salt diet , legs elevated, support stocking.  follow up as planned with Dr. Marchelle Gearing and as needed  Please contact office for sooner follow up if symptoms do not improve or worsen   Orders: Est. Patient Level IV (84132)  Medications Added to Medication List This Visit: 1)  Lantus 100 Unit/ml Soln (Insulin glargine) .... 53 units subcutaneously once daily with dinner 2)  Prednisone 10 Mg Tabs (Prednisone) .... 4 tabs for 2 days, then 3 tabs for 2 days, 2 tabs for 2 days, then 1 tab for 2 days, then stop 3)  Doxycycline Hyclate 100 Mg Caps (Doxycycline hyclate) .[name].. 1 by mouth two times a day  Other Orders: Depo- Medrol 40mg  (J1030) Depo- Medrol 80mg  (J1040) Admin of Therapeutic Inj  intramuscular or subcutaneous (44010)  Patient Instructions: 1)  Prednisone taper over next week.  2)  Increase Hydrochlorothiazide 25mg  once daily for 4 days then back to  1/2 once daily  (call back if this is not on your med list at home) 3)  Doxycycline 100mg  two times a day for 7days 4)  low salt diet , legs elevated, support stocking.  5)  follow up as planned with Dr. Marchelle Gearing and as needed  6)  Please contact office for sooner follow up if symptoms do not improve or worsen  Prescriptions: DOXYCYCLINE HYCLATE 100 MG CAPS (DOXYCYCLINE HYCLATE) 1 by mouth two times a day  #14 x 0   Entered and Authorized by:   Rubye Oaks NP   Signed by:   Rubye Oaks NP on 08/27/2010   Method used:   Electronically to        [facility]. [phone]* (retail)       [address]       [place], [place]  [address]  Ph: [phone]       Fax: [phone]   RxID:   [id number] PREDNISONE 10 MG TABS (PREDNISONE) 4 tabs for 2 days, then 3 tabs for 2 days, 2 tabs for 2 days, then 1 tab for 2 days, then stop  #20 x 0   Entered and Authorized by:   Rubye Oaks NP   Signed by:   Rubye Oaks NP on 08/27/2010   Method used:   Electronically to        [facility] 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 Patient Level IV [06301]

## 2010-09-04 NOTE — Progress Notes (Signed)
Summary: prescription for anitibotic and prednisone  Phone Note Call from Patient   Caller: Patient Call For: [facility] Summary of Call: patient phoned stated that he is using his nebulizer and he is still having problems getting his breath. He goes to pulmonary on Tuesday and Thursday. Dr Marchelle Gearing first available is 09/04/10 he would like a prescription for an antibiotic and prednisone. He has a dry cough. He is having issues with getting hot and than cold and he is really achy. Patient uses [facility]. He can be reached at [phone] Initial call taken by: Vedia Coffer,  August 27, 2010 10:04 AM  Follow-up for Phone Call        pt currently having sob,cough-nonprod,chills and sweats. using mucinex and nebulizer with no relief. pt request ov. scheduled pt to see tp today at 11:30--[place]   Philipp Deputy [facility]  August 27, 2010 10:46 AM

## 2010-09-05 ENCOUNTER — Encounter (HOSPITAL_COMMUNITY): Payer: Medicare Other

## 2010-09-10 ENCOUNTER — Encounter (HOSPITAL_COMMUNITY): Payer: Medicare Other

## 2010-09-12 ENCOUNTER — Encounter (HOSPITAL_COMMUNITY): Payer: Medicare Other

## 2010-09-12 NOTE — Progress Notes (Signed)
Summary: [facility] has not recieved an order for the oxyzier  Phone Note From Other Clinic   Caller: phyliss with pulmonary rehab Call For: [name] Summary of Call: [name] phoned and stated that she sent an email to Dr Marchelle Gearing regarding an order for a nasal oxymizer but [facility] stated that they have not received this order. Dr Marchelle Gearing had indicated in a return email that he would have [name] take care of this.  [name] can be reached at [phone] or [phone] Initial call taken by: Vedia Coffer,  September 04, 2010 2:03 PM  Follow-up for Phone Call        I do not see where I have an email, but I thought I remember sending this in. I will send in order now. [name] aware. [name] CMA  September 04, 2010 3:15 PM

## 2010-09-17 ENCOUNTER — Encounter (HOSPITAL_COMMUNITY): Payer: Medicare Other

## 2010-09-19 ENCOUNTER — Encounter (HOSPITAL_COMMUNITY): Payer: Medicare Other

## 2010-09-22 ENCOUNTER — Encounter: Payer: Self-pay | Admitting: Internal Medicine

## 2010-09-24 ENCOUNTER — Encounter (HOSPITAL_COMMUNITY): Payer: Medicare Other

## 2010-09-26 ENCOUNTER — Encounter (HOSPITAL_COMMUNITY): Payer: Medicare Other

## 2010-10-01 ENCOUNTER — Encounter (HOSPITAL_COMMUNITY): Payer: Medicare Other

## 2010-10-03 ENCOUNTER — Encounter (HOSPITAL_COMMUNITY): Payer: Medicare Other

## 2010-10-03 NOTE — Letter (Signed)
Summary: CMN for Oxygen / [facility]  CMN for Oxygen / [facility]   Imported By: Lennie Odor 09/25/2010 10:36:15  _____________________________________________________________________  External Attachment:    Type:   Image     Comment:   External Document

## 2010-10-07 LAB — CBC
HCT: 48.5 % (ref 39.0–52.0)
Hemoglobin: 15 g/dL (ref 13.0–17.0)
MCH: 25.1 pg — ABNORMAL LOW (ref 26.0–34.0)
MCHC: 30.9 g/dL (ref 30.0–36.0)
MCV: 81.2 fL (ref 78.0–100.0)
MCV: 83 fL (ref 78.0–100.0)
Platelets: 159 10*3/uL (ref 150–400)
Platelets: 174 10*3/uL (ref 150–400)
RBC: 5.42 MIL/uL (ref 4.22–5.81)
RBC: 5.97 MIL/uL — ABNORMAL HIGH (ref 4.22–5.81)
RDW: 15.9 % — ABNORMAL HIGH (ref 11.5–15.5)
WBC: 11.8 10*3/uL — ABNORMAL HIGH (ref 4.0–10.5)
WBC: 12.2 10*3/uL — ABNORMAL HIGH (ref 4.0–10.5)

## 2010-10-07 LAB — GLUCOSE, CAPILLARY
Glucose-Capillary: 133 mg/dL — ABNORMAL HIGH (ref 70–99)
Glucose-Capillary: 188 mg/dL — ABNORMAL HIGH (ref 70–99)
Glucose-Capillary: 189 mg/dL — ABNORMAL HIGH (ref 70–99)
Glucose-Capillary: 206 mg/dL — ABNORMAL HIGH (ref 70–99)
Glucose-Capillary: 237 mg/dL — ABNORMAL HIGH (ref 70–99)
Glucose-Capillary: 285 mg/dL — ABNORMAL HIGH (ref 70–99)

## 2010-10-07 LAB — URINALYSIS, ROUTINE W REFLEX MICROSCOPIC
Glucose, UA: 100 mg/dL — AB
Leukocytes, UA: NEGATIVE
Protein, ur: 100 mg/dL — AB

## 2010-10-07 LAB — BASIC METABOLIC PANEL
Calcium: 8.6 mg/dL (ref 8.4–10.5)
Chloride: 98 mEq/L (ref 96–112)
Creatinine, Ser: 1.66 mg/dL — ABNORMAL HIGH (ref 0.4–1.5)
GFR calc Af Amer: 50 mL/min — ABNORMAL LOW (ref >60)

## 2010-10-07 LAB — COMPREHENSIVE METABOLIC PANEL
ALT: 31 U/L (ref 0–53)
Alkaline Phosphatase: 54 U/L (ref 39–117)
CO2: 31 mEq/L (ref 19–32)
GFR calc non Af Amer: 39 mL/min — ABNORMAL LOW (ref >60)
Glucose, Bld: 200 mg/dL — ABNORMAL HIGH (ref 70–99)
Potassium: 4.2 mEq/L (ref 3.5–5.1)
Sodium: 137 mEq/L (ref 135–145)
Total Bilirubin: 1 mg/dL (ref 0.3–1.2)

## 2010-10-07 LAB — DIFFERENTIAL
Basophils Relative: 0 % (ref 0–1)
Eosinophils Absolute: 0.2 10*3/uL (ref 0.0–0.7)
Neutrophils Relative %: 71 % (ref 43–77)

## 2010-10-07 LAB — HEMOGLOBIN A1C
Hgb A1c MFr Bld: 9.1 % — ABNORMAL HIGH (ref <5.7)
Mean Plasma Glucose: 214 mg/dL — ABNORMAL HIGH (ref <117)

## 2010-10-07 LAB — MAGNESIUM: Magnesium: 1.8 mg/dL (ref 1.5–2.5)

## 2010-10-07 LAB — LIPASE, BLOOD: Lipase: 56 U/L (ref 11–59)

## 2010-10-08 ENCOUNTER — Encounter (HOSPITAL_COMMUNITY): Payer: Medicare Other

## 2010-10-10 ENCOUNTER — Encounter (HOSPITAL_COMMUNITY): Payer: Medicare Other

## 2010-10-13 LAB — GLUCOSE, CAPILLARY

## 2010-10-14 LAB — GLUCOSE, CAPILLARY

## 2010-10-14 LAB — BASIC METABOLIC PANEL
Chloride: 98 mEq/L (ref 96–112)
Creatinine, Ser: 1.19 mg/dL (ref 0.4–1.5)
GFR calc Af Amer: >60 mL/min (ref >60)
GFR calc non Af Amer: >60 mL/min (ref >60)
Potassium: 4.2 mEq/L (ref 3.5–5.1)

## 2010-10-14 LAB — BRAIN NATRIURETIC PEPTIDE: Pro B Natriuretic peptide (BNP): <30 pg/mL (ref 0.0–100.0)

## 2010-10-14 LAB — LACTIC ACID, PLASMA: Lactic Acid, Venous: 1.5 mmol/L (ref 0.5–2.2)

## 2010-10-14 LAB — CBC
Hemoglobin: 13.5 g/dL (ref 13.0–17.0)
MCHC: 31.2 g/dL (ref 30.0–36.0)
MCV: 82 fL (ref 78.0–100.0)
RBC: 5.29 MIL/uL (ref 4.22–5.81)
WBC: 9.4 10*3/uL (ref 4.0–10.5)

## 2010-10-14 LAB — URINE CULTURE: Culture: NO GROWTH

## 2010-10-14 LAB — URINALYSIS, ROUTINE W REFLEX MICROSCOPIC
Bilirubin Urine: NEGATIVE
Glucose, UA: NEGATIVE mg/dL
Hgb urine dipstick: NEGATIVE
Protein, ur: NEGATIVE mg/dL
Specific Gravity, Urine: 1.024 (ref 1.005–1.030)
Urobilinogen, UA: 0.2 mg/dL (ref 0.0–1.0)

## 2010-10-14 LAB — POCT CARDIAC MARKERS: CKMB, poc: 5.4 ng/mL (ref 1.0–8.0)

## 2010-10-14 LAB — DIFFERENTIAL
Basophils Relative: 1 % (ref 0–1)
Eosinophils Absolute: 0.5 10*3/uL (ref 0.0–0.7)
Lymphs Abs: 1.7 10*3/uL (ref 0.7–4.0)
Monocytes Absolute: 0.8 10*3/uL (ref 0.1–1.0)
Monocytes Relative: 9 % (ref 3–12)

## 2010-10-15 ENCOUNTER — Encounter (HOSPITAL_COMMUNITY): Payer: Medicare Other

## 2010-10-17 ENCOUNTER — Encounter (HOSPITAL_COMMUNITY): Payer: Medicare Other

## 2010-10-22 ENCOUNTER — Encounter (HOSPITAL_COMMUNITY): Payer: Medicare Other

## 2010-10-24 ENCOUNTER — Encounter (HOSPITAL_COMMUNITY): Payer: Medicare Other

## 2010-10-28 LAB — POCT CARDIAC MARKERS
CKMB, poc: 3.4 ng/mL (ref 1.0–8.0)
Troponin i, poc: <0.05 ng/mL (ref 0.00–0.09)

## 2010-10-28 LAB — CULTURE, BLOOD (ROUTINE X 2): Culture: NO GROWTH

## 2010-10-28 LAB — CBC
HCT: 38.1 % — ABNORMAL LOW (ref 39.0–52.0)
HCT: 39.1 % (ref 39.0–52.0)
Hemoglobin: 12.1 g/dL — ABNORMAL LOW (ref 13.0–17.0)
Hemoglobin: 12.4 g/dL — ABNORMAL LOW (ref 13.0–17.0)
MCHC: 31.8 g/dL (ref 30.0–36.0)
MCV: 83.7 fL (ref 78.0–100.0)
MCV: 85.2 fL (ref 78.0–100.0)
Platelets: 145 10*3/uL — ABNORMAL LOW (ref 150–400)
Platelets: 159 10*3/uL (ref 150–400)
RBC: 4.59 MIL/uL (ref 4.22–5.81)
RDW: 16.8 % — ABNORMAL HIGH (ref 11.5–15.5)
WBC: 5.3 10*3/uL (ref 4.0–10.5)

## 2010-10-28 LAB — DIFFERENTIAL
Basophils Absolute: 0 10*3/uL (ref 0.0–0.1)
Eosinophils Absolute: 0 10*3/uL (ref 0.0–0.7)
Eosinophils Absolute: 0.2 10*3/uL (ref 0.0–0.7)
Eosinophils Relative: 0 % (ref 0–5)
Eosinophils Relative: 4 % (ref 0–5)
Lymphocytes Relative: 11 % — ABNORMAL LOW (ref 12–46)
Lymphocytes Relative: 23 % (ref 12–46)
Lymphs Abs: 1.2 10*3/uL (ref 0.7–4.0)
Monocytes Absolute: 0.3 10*3/uL (ref 0.1–1.0)
Monocytes Absolute: 0.5 10*3/uL (ref 0.1–1.0)
Monocytes Relative: 10 % (ref 3–12)

## 2010-10-28 LAB — BASIC METABOLIC PANEL
BUN: 12 mg/dL (ref 6–23)
CO2: 29 mEq/L (ref 19–32)
Chloride: 103 mEq/L (ref 96–112)
Chloride: 104 mEq/L (ref 96–112)
GFR calc Af Amer: >60 mL/min (ref >60)
GFR calc non Af Amer: >60 mL/min (ref >60)
Potassium: 4.5 mEq/L (ref 3.5–5.1)
Potassium: 4.6 mEq/L (ref 3.5–5.1)
Sodium: 138 mEq/L (ref 135–145)
Sodium: 141 mEq/L (ref 135–145)

## 2010-10-28 LAB — GLUCOSE, CAPILLARY
Glucose-Capillary: 114 mg/dL — ABNORMAL HIGH (ref 70–99)
Glucose-Capillary: 114 mg/dL — ABNORMAL HIGH (ref 70–99)
Glucose-Capillary: 126 mg/dL — ABNORMAL HIGH (ref 70–99)
Glucose-Capillary: 193 mg/dL — ABNORMAL HIGH (ref 70–99)
Glucose-Capillary: 226 mg/dL — ABNORMAL HIGH (ref 70–99)
Glucose-Capillary: 254 mg/dL — ABNORMAL HIGH (ref 70–99)
Glucose-Capillary: 286 mg/dL — ABNORMAL HIGH (ref 70–99)
Glucose-Capillary: 286 mg/dL — ABNORMAL HIGH (ref 70–99)
Glucose-Capillary: 305 mg/dL — ABNORMAL HIGH (ref 70–99)
Glucose-Capillary: 44 mg/dL — ABNORMAL LOW (ref 70–99)
Glucose-Capillary: 79 mg/dL (ref 70–99)

## 2010-10-28 LAB — BLOOD GAS, ARTERIAL
Bicarbonate: 27.8 mEq/L — ABNORMAL HIGH (ref 20.0–24.0)
Drawn by: 295031
O2 Content: 5 L/min
O2 Saturation: 90.6 %
Patient temperature: 98.6
Patient temperature: 99.3
TCO2: 25.3 mmol/L (ref 0–100)
TCO2: 28.1 mmol/L (ref 0–100)
pCO2 arterial: 70.5 mmHg (ref 35.0–45.0)
pH, Arterial: 7.255 — ABNORMAL LOW (ref 7.350–7.450)

## 2010-10-28 LAB — CARDIAC PANEL(CRET KIN+CKTOT+MB+TROPI)
CK, MB: 7 ng/mL — ABNORMAL HIGH (ref 0.3–4.0)
CK, MB: 8.1 ng/mL — ABNORMAL HIGH (ref 0.3–4.0)
Relative Index: INVALID (ref 0.0–2.5)
Relative Index: INVALID (ref 0.0–2.5)
Total CK: 62 U/L (ref 7–232)
Troponin I: 0.01 ng/mL (ref 0.00–0.06)

## 2010-10-28 LAB — TSH: TSH: 0.276 u[IU]/mL — ABNORMAL LOW (ref 0.350–4.500)

## 2010-10-28 LAB — CK TOTAL AND CKMB (NOT AT ARMC): Relative Index: INVALID (ref 0.0–2.5)

## 2010-10-28 LAB — TROPONIN I: Troponin I: 0.02 ng/mL (ref 0.00–0.06)

## 2010-10-28 LAB — BRAIN NATRIURETIC PEPTIDE: Pro B Natriuretic peptide (BNP): 43.3 pg/mL (ref 0.0–100.0)

## 2010-10-29 ENCOUNTER — Encounter (HOSPITAL_COMMUNITY): Payer: Medicare Other

## 2010-10-31 ENCOUNTER — Encounter (HOSPITAL_COMMUNITY): Payer: Medicare Other

## 2010-11-03 LAB — COMPREHENSIVE METABOLIC PANEL
ALT: 28 U/L (ref 0–53)
AST: 32 U/L (ref 0–37)
Albumin: 3.7 g/dL (ref 3.5–5.2)
Calcium: 8.7 mg/dL (ref 8.4–10.5)
GFR calc Af Amer: >60 mL/min (ref >60)
Sodium: 136 mEq/L (ref 135–145)
Total Protein: 6.6 g/dL (ref 6.0–8.3)

## 2010-11-03 LAB — DIFFERENTIAL
Eosinophils Absolute: 0 10*3/uL (ref 0.0–0.7)
Eosinophils Relative: 0 % (ref 0–5)
Lymphs Abs: 0.3 10*3/uL — ABNORMAL LOW (ref 0.7–4.0)
Monocytes Absolute: 0 10*3/uL — ABNORMAL LOW (ref 0.1–1.0)
Monocytes Relative: 0 % — ABNORMAL LOW (ref 3–12)
Neutrophils Relative %: 95 % — ABNORMAL HIGH (ref 43–77)

## 2010-11-03 LAB — CBC
RBC: 4.86 MIL/uL (ref 4.22–5.81)
WBC: 6.5 10*3/uL (ref 4.0–10.5)

## 2010-11-03 LAB — GLUCOSE, CAPILLARY
Glucose-Capillary: 186 mg/dL — ABNORMAL HIGH (ref 70–99)
Glucose-Capillary: 217 mg/dL — ABNORMAL HIGH (ref 70–99)
Glucose-Capillary: 47 mg/dL — ABNORMAL LOW (ref 70–99)

## 2010-11-03 LAB — CK TOTAL AND CKMB (NOT AT ARMC)
CK, MB: 8.7 ng/mL — ABNORMAL HIGH (ref 0.3–4.0)
Total CK: 108 U/L (ref 7–232)

## 2010-11-03 LAB — BRAIN NATRIURETIC PEPTIDE: Pro B Natriuretic peptide (BNP): 33.6 pg/mL (ref 0.0–100.0)

## 2010-11-03 LAB — CARDIAC PANEL(CRET KIN+CKTOT+MB+TROPI): CK, MB: 9.4 ng/mL — ABNORMAL HIGH (ref 0.3–4.0)

## 2010-11-05 ENCOUNTER — Encounter (HOSPITAL_COMMUNITY): Payer: Medicare Other

## 2010-11-07 ENCOUNTER — Encounter (HOSPITAL_COMMUNITY): Payer: Medicare Other

## 2010-11-12 ENCOUNTER — Encounter (HOSPITAL_COMMUNITY): Payer: Medicare Other

## 2010-11-14 ENCOUNTER — Encounter (HOSPITAL_COMMUNITY): Payer: Medicare Other

## 2010-11-19 ENCOUNTER — Encounter (HOSPITAL_COMMUNITY): Payer: Medicare Other

## 2010-11-21 ENCOUNTER — Encounter (HOSPITAL_COMMUNITY): Payer: Medicare Other

## 2010-11-26 ENCOUNTER — Encounter (HOSPITAL_COMMUNITY): Payer: Medicare Other

## 2010-11-28 ENCOUNTER — Encounter (HOSPITAL_COMMUNITY): Payer: Medicare Other

## 2010-12-03 ENCOUNTER — Encounter (HOSPITAL_COMMUNITY): Payer: Medicare Other

## 2010-12-04 ENCOUNTER — Encounter: Payer: Self-pay | Admitting: Internal Medicine

## 2010-12-05 ENCOUNTER — Other Ambulatory Visit (INDEPENDENT_AMBULATORY_CARE_PROVIDER_SITE_OTHER): Payer: Medicare Other

## 2010-12-05 ENCOUNTER — Ambulatory Visit (INDEPENDENT_AMBULATORY_CARE_PROVIDER_SITE_OTHER): Payer: Medicare Other | Admitting: Internal Medicine

## 2010-12-05 ENCOUNTER — Encounter (HOSPITAL_COMMUNITY): Payer: Medicare Other

## 2010-12-05 ENCOUNTER — Encounter: Payer: Self-pay | Admitting: Internal Medicine

## 2010-12-05 DIAGNOSIS — N529 Male erectile dysfunction, unspecified: Secondary | ICD-10-CM

## 2010-12-05 DIAGNOSIS — E119 Type 2 diabetes mellitus without complications: Secondary | ICD-10-CM

## 2010-12-05 DIAGNOSIS — R5383 Other fatigue: Secondary | ICD-10-CM | POA: Insufficient documentation

## 2010-12-05 DIAGNOSIS — R5381 Other malaise: Secondary | ICD-10-CM

## 2010-12-05 DIAGNOSIS — I10 Essential (primary) hypertension: Secondary | ICD-10-CM

## 2010-12-05 DIAGNOSIS — E039 Hypothyroidism, unspecified: Secondary | ICD-10-CM

## 2010-12-05 DIAGNOSIS — G4700 Insomnia, unspecified: Secondary | ICD-10-CM

## 2010-12-05 LAB — CBC WITH DIFFERENTIAL/PLATELET
Eosinophils Relative: 3.4 % (ref 0.0–5.0)
HCT: 44.7 % (ref 39.0–52.0)
Hemoglobin: 14.8 g/dL (ref 13.0–17.0)
Lymphocytes Relative: 30.4 % (ref 12.0–46.0)
Lymphs Abs: 2.1 10*3/uL (ref 0.7–4.0)
Monocytes Relative: 8.3 % (ref 3.0–12.0)
Neutro Abs: 4 10*3/uL (ref 1.4–7.7)
RBC: 5.42 Mil/uL (ref 4.22–5.81)
WBC: 7 10*3/uL (ref 4.5–10.5)

## 2010-12-05 LAB — BASIC METABOLIC PANEL
BUN: 20 mg/dL (ref 6–23)
Calcium: 9.2 mg/dL (ref 8.4–10.5)
Creatinine, Ser: 1 mg/dL (ref 0.4–1.5)
GFR: 81.5 mL/min (ref >60.00)
Glucose, Bld: 246 mg/dL — ABNORMAL HIGH (ref 70–99)
Sodium: 139 mEq/L (ref 135–145)

## 2010-12-05 LAB — HEPATIC FUNCTION PANEL
Albumin: 4.1 g/dL (ref 3.5–5.2)
Alkaline Phosphatase: 53 U/L (ref 39–117)

## 2010-12-05 LAB — HEMOGLOBIN A1C: Hgb A1c MFr Bld: 10.5 % — ABNORMAL HIGH (ref 4.6–6.5)

## 2010-12-05 MED ORDER — ZOLPIDEM TARTRATE 5 MG PO TABS: 5.0000 mg | ORAL_TABLET | Freq: Every evening | ORAL | Status: DC | PRN

## 2010-12-05 MED ORDER — TADALAFIL 20 MG PO TABS: 20.0000 mg | ORAL_TABLET | Freq: Every day | ORAL | Status: AC | PRN

## 2010-12-05 NOTE — Assessment & Plan Note (Signed)
Etiology unclear, Exam otherwise benign, to check labs as documented, follow with expectant management  

## 2010-12-05 NOTE — Assessment & Plan Note (Signed)
Mild, for ambien trial,  to f/u any worsening symptoms or concerns

## 2010-12-05 NOTE — Patient Instructions (Addendum)
Take all new medications as prescribed Continue all other medications as before Please go to LAB in the Basement for the blood and/or urine tests to be done today Please call the number on the Blue Card (the [employer] System) for results of testing in 2-3 days Please keep your appointments with your specialists as you have planned Please return in 6 mo or as needed

## 2010-12-05 NOTE — Assessment & Plan Note (Signed)
stable overall by hx and exam, most recent lab reviewed with pt, and pt to continue medical treatment as before  BP Readings from Last 3 Encounters:  12/05/10 130/78  08/27/10 126/70  08/12/10 130/76

## 2010-12-05 NOTE — Assessment & Plan Note (Signed)
stable overall by hx and exam, most recent lab reviewed with pt, and pt to continue medical treatment as before  Lab Results  Component Value Date   TSH 1.63 11/23/2009

## 2010-12-05 NOTE — Assessment & Plan Note (Signed)
For cialis prn,  to f/u any worsening symptoms or concerns 

## 2010-12-05 NOTE — Assessment & Plan Note (Signed)
stable overall by hx and exam, most recent lab reviewed with pt, and pt to continue medical treatment as before 

## 2010-12-05 NOTE — Progress Notes (Signed)
Subjective:    Patient ID: [name], male    DOB: 08/14/1940, 70 y.o.   MRN: [id number]  HPI  Here to f/u;  Back pain improved after last vist, then MRI and NS eval so did not have to have the recommended ESI;  No recurrance of back pain;  [facility];  Pt denies chest pain, increased sob or doe, wheezing, orthopnea, PND, increased LE swelling, palpitations, dizziness or syncope.  Pt denies new neurological symptoms such as new headache, or facial or extremity weakness or numbness   Pt denies polydipsia, polyuria, or low sugar symptoms such as weakness or confusion improved with po intake.  Pt states overall good compliance with meds, trying to follow lower cholesterol, diabetic diet, wt overall stable but little exercise however.     Does also have significant insomnia, and ED symptoms, both worsening  Over the past 6 mo;  Current doxepin for insomnia related to PTSD per psychiatry at the [place] just not working.  Not tried [place] or other.  Does have sense of ongoing fatigue, but denies signficant hypersomnolence.   Denies hyper or hypo thyroid symptoms such as voice, skin or hair change.  Past Medical History  Diagnosis Date  . THRUSH 11/06/2009  . HYPOTHYROIDISM 07/30/2009  . DIABETES MELLITUS, TYPE II 11/23/2009  . DEPRESSION 11/23/2009  . 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 HYPERTENSION 07/30/2009  . CORONARY ARTERY DISEASE 11/23/2009  . CHRONIC OBSTRUCTIVE PULMONARY DISEASE, ACUTE EXACERBATION 03/01/2010  . C O P D 07/30/2009  . 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 RESPIRATORY FAILURE, CHRONIC 07/31/2009  . BENIGN PROSTATIC HYPERTROPHY 11/23/2009  . DEGENERATIVE JOINT DISEASE 11/23/2009  . 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 FATIGUE 11/23/2009  . 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 GAIT DISTURBANCE 12/10/2009  . DYSPNEA/SHORTNESS OF BREATH 12/08/2009  . HEMOPTYSIS UNSPECIFIED 05/07/2010   Past Surgical History  Procedure Date  . Small bowel obstruction repair with  adhesiolysis   . Hx of remote ileum resection due to bleeding   . Abdominal aortic aneurysm repair   . S/p back surgury  1972    lumbar laminectomy Dr. Fannie Knee  . S/p left femoral embolectomy with left leg ischemia     Dr. Hart [place], vascular  . S/p right ganglion cyst     Dr. Teressa Senter    reports that he has quit smoking. He does not have any smokeless tobacco history on file. He reports that he does not drink alcohol or use illicit drugs. family history is not on file. No Known Allergies Current Outpatient Prescriptions on File Prior to Visit  Medication Sig Dispense Refill  . albuterol (PROAIR HFA) 108 (90 BASE) MCG/ACT inhaler Inhale 2 puffs into the lungs 4 (four) times daily.        [name] aspirin 81 MG tablet Take 81 mg by mouth daily.        . Cholecalciferol (VITAMIN D3) 2000 UNITS TABS Take by mouth daily.        [name] doxepin (SINEQUAN) 10 MG capsule Take 10 mg by mouth 2 (two) times daily.        [name] doxepin (SINEQUAN) 50 MG capsule Take 50 mg by mouth at bedtime.        . finasteride (PROSCAR) 5 MG tablet Take 5 mg by mouth daily.        . formoterol (FORADIL AEROLIZER) 12 MCG capsule for inhaler Inhale contents of 1 capsule two times a day       .  gabapentin (NEURONTIN) 800 MG tablet Take 800 mg by mouth 2 (two) times daily.        . hydrochlorothiazide 25 MG tablet 1/2 by mouth once daily       . insulin aspart (NOVOLOG) 100 UNIT/ML injection Sliding scale as needed with meals       . insulin glargine (LANTUS) 100 UNIT/ML injection Inject 53 Units into the skin at bedtime.        [name] levothyroxine (SYNTHROID, LEVOTHROID) 200 MCG tablet 1 tablet by mouth every morning and every evening       . losartan (COZAAR) 100 MG tablet 1/2 by mouth once daily       . metFORMIN (GLUCOPHAGE) 1000 MG tablet Take 1,000 mg by mouth 2 (two) times daily.        . mometasone (ASMANEX 60 METERED DOSES) 220 MCG/INH inhaler Inhale 2 puffs into the lungs daily.        . Multiple Vitamins-Minerals (MEGA MULTIVITAMIN  FOR MEN PO) Take by mouth daily.        . niacin (SLO-NIACIN) 500 MG tablet Take 500 mg by mouth at bedtime.        [name] rOPINIRole (REQUIP) 1 MG tablet Take 1 mg by mouth at bedtime.        . simvastatin (ZOCOR) 20 MG tablet 1/4 by mouth once daily       . Tamsulosin HCl (FLOMAX) 0.4 MG CAPS Take by mouth at bedtime.        [name] tiotropium (SPIRIVA HANDIHALER) 18 MCG inhalation capsule Place 18 mcg into inhaler and inhale daily.        [name] DISCONTD: guaifenesin (HUMIBID E) 400 MG TABS 2 tablets by mouth two times a day       . DISCONTD: Acetylcysteine (NAC 600) 600 MG CAPS Take by mouth 3 (three) times daily.        [name] DISCONTD: cetirizine (ZYRTEC) 10 MG tablet Take 10 mg by mouth daily.        [name] DISCONTD: doxycycline (VIBRAMYCIN) 100 MG capsule Take 100 mg by mouth 2 (two) times daily.        [name] DISCONTD: predniSONE (DELTASONE) 10 MG tablet 4 tablets for 2 days, then 3 tablets for 2 days 2 tablets for 2 days, then 1 tablet for 2 days, then stop.        Review of Systems All otherwise neg per pt     Objective:   Physical Exam BP 130/78  Pulse 77  Temp(Src) 97.7 F (36.5 C) (Oral)  Ht 6\' 3"  (1.905 m)  Wt 245 lb 8 oz (111.358 kg)  BMI 30.69 kg/m2  SpO2 99% Physical Exam  VS noted Constitutional: Pt appears well-developed and well-nourished.  HENT: Head: Normocephalic.  Right Ear: External ear normal.  Left Ear: External ear normal.  Eyes: Conjunctivae and EOM are normal. Pupils are equal, round, and reactive to light.  Neck: Normal range of motion. Neck supple.  Cardiovascular: Normal rate and regular rhythm.   Pulmonary/Chest: Effort normal and breath sounds decreased.  Abd:  Soft, NT, non-distended, + BS Neurological: Pt is alert. No cranial nerve deficit.  Skin: Skin is warm. No erythema.  Psychiatric: Pt behavior is normal. Thought content normal. 1+ nervous        Assessment & Plan:

## 2010-12-10 ENCOUNTER — Encounter (HOSPITAL_COMMUNITY): Payer: Medicare Other

## 2010-12-10 NOTE — H&P (Signed)
[name], [name]            ACCOUNT NO.:  [id number]   MEDICAL RECORD NO.:  [id number]          PATIENT TYPE:  EMS   LOCATION:  ED                           FACILITY:  [facility]   PHYSICIAN:  Kela Millin, M.D.DATE OF BIRTH:  05/17/1941   DATE OF ADMISSION:  02/10/2009  DATE OF DISCHARGE:                              HISTORY & PHYSICAL   PRIMARY CARE PHYSICIAN:  Georgann Housekeeper, M.D.   CHIEF COMPLAINT:  Worsening shortness of breath.   HISTORY OF PRESENT ILLNESS:  The patient is a pleasant 70 year old white  male with past medical history significant for COPD, diabetes,  hypertension who presents with the above complaints.  He states that for  the past 3 weeks, he has had shortness of breath on and off but it has  worsened in the past 1 week.  He states that he was unable to sleep last  night because of shortness of breath and even tried to use his wife's  oxygen, but that still did not help him. ?  PND, he also admits to some  leg swelling this past week.  He states that his cough is unchanged -  but productive of clear sputum.  He states that the last time he was  treated with prednisone taper was about a month ago by his primary care  physician.  He admits to chills but denies fevers.  He denies chest  pain, nausea, vomiting, dizziness, focal weakness, dysuria, diarrhea,  melena and no hematochezia.   He was seen in the ED and a chest x-ray was done, which showed no  evidence for acute cardiopulmonary disease.  Chronic peribronchial  thickening and right basilar scarring was noted.  He was treated with  nebulized bronchodilators as well as Solu-Medrol and his symptoms  improved.  The ED physician checked his O2 sat's on room air but even  following this treatment his O2 sat was 84% and he is admitted for  further evaluation and management.  The patient states that he was  initially seen at the [facility] walk-in clinic prior to been transferred to  the [facility] ED.   PAST  MEDICAL HISTORY:  1. As above.  2. History of AAA repair.  3. History of small bowel obstruction.   MEDICATIONS:  1. Spiriva with Handi-haler.  2. Mucinex.  3. Novolin NPH 50 units q.h.s.  4. Glucophage ? 500 p.o. b.i.d.  5. Albuterol p.r.n.  6. The patient does not remember his other medications.   ALLERGIES:  NKDA.   SOCIAL HISTORY:  He quit tobacco about 10 years ago, he denies alcohol.   FAMILY HISTORY:  Noncontributory to current illness.   REVIEW OF SYSTEMS:  As per HPI, other review of systems negative.   PHYSICAL EXAMINATION:  GENERAL:  The patient is an obese elderly white  male, in no apparent distress with nasal cannula oxygen on.  VITAL SIGNS:  Temperature is 97.6 with a blood pressure of 136/81, pulse  60, respiration 18, O2 sat of 96% on nasal cannula oxygen.  NECK:  Supple, no adenopathy, no thyromegaly and no JVD appreciated.  LUNGS:  -  Moderate air movement bilaterally, no wheezes appreciated.  CARDIOVASCULAR:  Regular rate and rhythm.  Normal S1-S2, no S3  appreciated.  ABDOMEN:  - Obese, bowel sounds present, nontender, nondistended.  No  organomegaly and no masses palpable.  EXTREMITIES:  +1 edema.  NEUROLOGIC:  Alert and oriented x3.  Cranial nerves II-XII grossly  intact.  Nonfocal exam.   LABORATORY DATA:  Chest X-Ray: As per HPI. Other labs ordered and  pending at the time of this dictation.   ASSESSMENT AND PLAN:  1. COPD exacerbation - will continue nebulized bronchodilators, also      place on empiric antibiotics and oral prednisone taper.  Will      continue supplemental oxygen following recheck. We will also treat      this expectorants and follow. The patient with ? PND - and he has      peripheral edema as noted on exam, will obtain a brain natriuretic      peptide, if elevated consider 2-D echocardiogram pending brain      natriuretic peptide results and further evaluation. Will also      obtain an EKG and empiric exams and follow.  2.  Diabetes mellitus - will continue NPH, cover with sliding scale      insulin, monitor Accu-Checks.  Hold metformin for now, follow and      resume when appropriate.  3. Hypertension - the patient does not recall his BP med's, monitor      blood pressures and treat accordingly.  4. History of AAA repair.      Kela Millin, M.D.  Electronically Signed     ACV/MEDQ  D:  02/10/2009  T:  02/10/2009  Job:  [id number]   cc:   Georgann Housekeeper, MD  Fax: [phone]

## 2010-12-13 NOTE — Op Note (Signed)
[place]. [facility]  Patient:    [name], [name]                     MRN: [id number] Proc. Date: 08/17/00 Attending:  Quita Skye. Hart Rochester, M.D. CC:         Georgann Housekeeper, M.D.   Operative Report  PREOPERATIVE DIAGNOSIS:  Ischemic left leg, rule out embolus versus occlusive disease.  POSTOPERATIVE DIAGNOSIS:  Ischemic left leg secondary to embolus to tibial vessels left leg, etiology unknown.  OPERATION:  Left transfemoral embolectomy of tibial vessels.  SURGEON:  Quita Skye. Hart Rochester, M.D.  ASSISTANT:  Maxwell Marion, R.N.F.A.  ANESTHESIA:  General endotracheal anesthesia.  DESCRIPTION OF PROCEDURE:  The patient was taken to the operating room, placed in the supine position, at which time the left lower extremity was prepped with Betadine scrub and solution, and draped in a routine sterile manner. After satisfactory general endotracheal anesthesia was administered and a longitudinal incision was made in the left groin, carried down to the subcutaneous tissue, and the common superficial and profunda femoris arteries dissected free and encircled with vesi-loops.  There was posterior plaque formation in the common femoral artery but the vessels were soft anteriorly and had excellent pulses.  Then 6000 units of heparin were given intravenously.  Vessels occluded and transverse opening made in the distal common femoral artery with a 15 blade and extended with the Potts scissors.  There was no thrombus within the femoral vessel and there was excellent inflow.  The Fogarty catheter was found to pass down the profunda and no debride removed from the profunda with excellent backbleeding.  The Fogarty was passed down the superficial femoral artery and it went the entire length of the Fogarty catheter to the ankle level and returned thrombus and the embolic-looking material was retrieved followed by excellent backbleeding.  Multiple passes with the Fogarty were  performed with the Fogarty going down the posterior tibial and perineal arteries, presumably because of the different length the catheter would traverse the vessel, and after removing some embolic and thrombotic debride from both of these followed by multiple negative passes, the vessel was flushed with heparin saline.  Arteriotomy closed with two continuous 6-0 Prolene sutures.  Clamps were then released and there was an excellent pulse.  There was audible doppler flow in the posterior tibial and perineal arteries of the ankle.  The patient had been relatively hypotensive, with pressure in the 70s, early during the procedure which did improve up to around 100 systolic toward the end of the procedure.  He had a creatinine of 4.6.  Not felt to be a candidate for any type of intraoperative angiogram or further surgery at this time. Adequate hemostasis was achieved.  No protamine was given.  The wound was closed in layers of Vicryl in subcuticular fashion, sterile dressing.  The patient was taken to the recovery room in satisfactory condition. DD:  08/17/00 TD:  08/18/00 Job: [id number]

## 2010-12-13 NOTE — Op Note (Signed)
NAME:  [name], [name]                      ACCOUNT NO.:  [id number]   MEDICAL RECORD NO.:  [id number]                   PATIENT TYPE:  INP   LOCATION:  0447                                 FACILITY:  [facility]   PHYSICIAN:  Anselm Pancoast. Zachery Dakins, M.D.          DATE OF BIRTH:  August 10, 1940   DATE OF PROCEDURE:  03/19/2004  DATE OF DISCHARGE:                                 OPERATIVE REPORT   PREOPERATIVE DIAGNOSIS:  Small bowel obstruction, probably adhesions.   POSTOPERATIVE DIAGNOSIS:  Small bowel obstruction secondary to adhesions.   OPERATION:  Exploratory laparotomy and lysis of adhesions for mechanical  small bowel obstruction.   ANESTHESIA:  General.   SURGEON:  Anselm Pancoast. Zachery Dakins, M.D.   ASSISTANT:  Adolph Pollack, M.D.   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  About 18 months  ago, had an abdominal aneurysm repair by Dr. Hart Rochester, who also has had some  peripheral vascular procedures, and he has done fine.  He is a diabetic  controlled with oral hypoglycemic medications, but he started having severe  abdominal pain on Sunday, nausea, and then started having bloated and  bilious vomitus and went to the emergency room in the early morning in  [place], where they did plain abdominal film that showed a small bowel  obstruction and a CT that showed dilated loops of small bowel.  There were  some decompressed loops of small bowel.  He desired to be transferred to  [facility].  I was on call and accepted him in transfer, and he was admitted  yesterday.  An NG tube was placed.  He has had a large amount of NG  drainage.  His white count, which was slightly elevated, has come down to  normal.  Repeat x-rays today on two occasions, really have shown no  significant improvement, very dilated loops of small  bowel and a little bit  of gas in the colon and just numerous canisters of bilious NG drainage.  I  recommended that we proceed with a laparotomy.   The patient was given 3 gm of Unasyn.  He has PAS stockings.  Positioned on  the OR table.  Induction of general anesthesia and a Foley catheter inserted  sterilely.  The abdomen was then prepped with Betadine solution and draped  in a sterile manner.  I opened very carefully the area right around the  umbilicus.  It had been closed previously with a running, what looked like a  #1 Prolene, and we kind of carefully entered into the peritoneal cavity,  very cautiously, as he was so dilated.  The omentum was very adherent to  this previous midline incision.  Then after we identified the pre-  peritoneum, we kind of carefully opened the incision.  There was a loop  of  small bowel caught up to the under-surface of the incision, kind of at the  top of the bladder area, but this was not the point of obstruction.  We  could see very dilated loops of small bowel as well as decompressed loops of  small bowel.  The distal portion that was decompressed, with following it  backwards, and there was an area that was a little bit bloated, or just kind  of a chronic area that looked like a side-to-side functional anastomosis.  You could see the staples in the area where a Meckel's would have been  previously, and most likely, that was the cause of his perforation about 10  years ago.  We continued dissecting proximally.  It was necessary to extend  the incision and completely free up the omentum around the umbilicus, still  where only about a half or less of the previous aortic aneurysm incision.  There was a loop of small bowel that had kind of gone up right into the base  of the aorta, just lateral to the left from where the ligament of Treitz  was, that was the actual point of obstruction, and of course, we have an NG  tube in the stomach.  We have sucked  up or manipulated it so the small bowel  contents would go back to the stomach so they could be aspirated, and he has  numerous little things in the stomach that I can feel, that feel like,  probably raw peanuts or something or other, and this is probably what  actually obstructed him, in addition to the chronic adhesions.  We finally  got past this tight area that was the area of definite obstruction  clinically, and freed the intestines, now from the ligament of Treitz down  to the terminal ileum.  There were a couple of areas that had a little  serosal tear, nothing as far as the mucosa, and these were repaired with  interrupted sutures of 3-0 silk, Lembert sutures.  The small bowel was then  placed back in anatomical position.  The NG tube was in good position.  We  sucked out probably about a liter of stuff from the NG tube during surgery,  and the tube was then secured in the patient's nose, and the omentum placed  over the small bowel that had been kind of placed back in anatomical  position.  The fascia was then closed with interrupted sutures of #1  Novofil, and the skin closed with staples.  I am going to give him a couple  of more days of antibiotics because of the extensive manipulation in his  diabetic status.  We will keep him on a sliding insulin coverage until he is  able to eat and take his oral medication.  I am going to keep his Foley  catheter for a day or two.  He had about 4 liters of IV fluids and adequate  urine output intraoperatively.                                               Anselm Pancoast. Zachery Dakins, M.D.    WJW/MEDQ  D:  03/19/2004  T:  03/19/2004  Job:  [id number]   cc:   Quita Skye. Hart Rochester, M.D.  [address]  [place]  [place] [address]

## 2010-12-13 NOTE — Cardiovascular Report (Signed)
[place]. [facility]  Patient:    [name], [name] Visit Number: [id number] MRN: [id number]          Service Type: CAT Location: [facility] 2859 [id number] Attending Physician:  Mora Appl Dictated by:   Lemont Fillers Fraser Din, M.D. Proc. Date: 12/30/01 Admit Date:  12/30/2001 Discharge Date: 12/30/2001                          Cardiac Catheterization  REFERRING PHYSICIAN: Tyson Dense, M.D.  INDICATIONS FOR PROCEDURE: Dyspnea with redistribution noted at the apex, decreased in ejection fraction from 59% to 48%.  DESCRIPTION OF PROCEDURE: After obtaining written informed consent, the patient was brought to the cardiac catheterization lab in the postabsorptive state.  Preoperative sedation was achieved using IV Versed.  The right groin was prepped and draped in the usual sterile fashion. Local anesthesia was achieved using 1% Xylocaine. A 6 French hemostasis sheath was placed into the right femoral artery using a modified Seldinger technique and selective coronary angiography was performed using a JL4, JR4 Judkins catheter and multiple views were obtained. All catheter exchange were made over a guide wire. Following review of the films with Dr. Amil Amen there was no identifiable disease for intervention. The patient was transferred to the holding area. The hemostasis sheath was originally sewn into place while waiting review of the films from Dr. Amil Amen.  FINDINGS: There was no gradient noted on pullback. The estimated ejection fraction is 50%. There was inferior apical hypokinesis noted.  CORONARY ANGIOGRAPHY: Left main coronary artery: The left main coronary artery bifurcates into the left anterior descending and circumflex vessel. The left main coronary artery is a long artery. There is no significant disease in the left main coronary artery.  Left anterior descending: The left anterior descending gives rise to a moderate bifurcating diagonal #1, small  diagonal #2, small diagonal #3. It then goes on to end as an apical recurrent branch. The second diagonal is a bifurcating vessel. The lower limb of the diagonal has a 70-80% lesion.  Circumflex vessel: The circumflex vessel is a moderate sized vessel. It gives rise to a small OM-1, small OM-2, a large OM-3 then goes on to end as an AV groove vessel. There is no significant disease in the circumflex or its branches.  Right coronary artery: The right coronary artery is a large artery. It gives rise to small RV marginal #1, small to moderate RV marginal #2, small to moderate RV marginal #3, large PDA and a large PL branch. The right coronary artery contains sequential proximal lesions of up to 50% with poststenotic ectasia noted. There is also a branch from the posterolateral approaching the apex, which is late filling. This late filling vessel may account for the redistribution noted on the Cardiolite.  FINAL IMPRESSION: 1. Inferior apical hypokinesis, ejection fraction of 50%. 2. A 70% lesion in a diagonal that is 1.5 mm in size. 3. 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 There is    no significant flow-limiting lesions noted. He will continue with    medical management. Dictated by:   Lemont Fillers Fraser Din, M.D. Attending Physician:  Meade Maw A DD:  12/30/01 TD:  01/02/02 Job: [id number]

## 2010-12-13 NOTE — Consult Note (Signed)
[place]. [facility]  Patient:    BRENDA, COWHER                   MRN: [id number] Proc. Date: 08/18/00 Adm. Date:  [id number] Attending:  Georgann Housekeeper CC:         Georgann Housekeeper, M.D.   Consultation Report  INDICATION FOR CONSULTATION:  Elevated troponins, elevated CK.  HISTORY:  Brain Honeycutt is a 70 year old male who presented to the office after complaints of two days of fatigue, chills, and diaphoresis.  The patient had tactile fevers but did not take his temperature.  Presented to the office when he had persistent left foot pain.  In the office he was noted to have a cold left foot which was cyanotic, especially in the first two digits, and a systolic blood pressure of 70.  The patient was noted to be clammy.  He was transferred to the emergency room for further evaluation.  Upon arrival to the emergency room, cardiovascular surgery were consulted emergently.  The patient was transferred to the operating room, where he had a left femoral artery embolectomy and primary closure performed by Dr. Hart Rochester.  He subsequently required prolonged intubation and critical care service was consulted as well.  PROBLEM LIST:  1. Left femoral artery thrombus.  2. COPD.  3. More than 120-pack-year smoke history.  4. Acute renal failure with a creatinine of 4.6.  5. Postoperative respiratory distress.  6. Hypotension.  7. Dyslipidemia.  8. Depression.  9. Infrarenal aortic aneurysm. 10. Hypothyroidism.  PAST SURGICAL HISTORY:  1. Left femoral artery embolectomy.  2. L4-5 disk surgery.  3. Excision of a ganglionic cyst.  4. Surgery for perforated bowel.  Etiology of perforation unclear.  REVIEW OF SYSTEMS:  The patient denies chest pain.  He has had no increase in his shortness of breath prior to his presentation.  Review of systems is significant for chills.  No significant cough.  No abdominal pain.  Severe pain in his left lower sacroiliac area and  calf.  He has had no peripheral edema.  No tachyarrhythmia or orthopnea was noted prior to presentation.  MEDICATIONS PRIOR TO ADMISSION:  1. Synthroid 150 mcg p.o. q.d.  2. Prozac 20 mg p.o. q.d.  3. Endrel.  CURRENT MEDICATIONS:  1. Synthroid 150 mcg.  2. Heparin drip.  3. Tequin.  4. Zithromax (which has been discontinued).  5. Sodium chloride.  6. Tylenol p.r.n.  7. Humulin insulin p.r.n.  8. Diprivan (which has been discontinued).  ALLERGIES:  He has no known drug allergies.  SOCIAL HISTORY:  The patient is married, has one daughter.  FAMILY HISTORY:  Father died at age 30 from emphysema.  One sister who resides in [place].  PHYSICAL EXAMINATION:  GENERAL:  Elderly, now appearing older than his stated age.  Currently wearing a BiPAP mask.  Conversation is difficult.  VITAL SIGNS:  His vital signs flow sheet has been reviewed.  His systolic blood pressure has been persistently more than 100 for the past 10 hours and has crept up to the 130s-140s for the past 6 hours.  His heart rate has been from 60-80 beats per minute.  His telemetry has revealed a sinus rhythm with an occasional PVC.  He has been extubated since early a.m., with O2 saturations more than 90% on a 50% face mask.  His left PT has been Dopplerable throughout the day.  His current IV drips include Dopamine, which has been weaned off; heparin drip;  Diprivan, which has been weaned off; and Hespan has been given earlier.  He has had adequate urine output, with his urine output persistently more than 100 cc/h.  He has been receiving Lasix 40 mg intravenously to promote diuresis.  CHEST:  His primary exam reveals use of accessory muscles.  His breath sounds reveals crackles over his right base.  There is expiratory wheezing noted.  CARDIOVASCULAR:  His heart sounds are difficult to evaluate secondary to significant respiratory noise.  There appears to be regular rate and rhythm.  ABDOMEN:  Soft and  nontender.  There are bowel sounds present.  EXTREMITIES:  His left femoral site is dressed.  SKIN:  Currently warm and dry.  NEUROLOGIC:  His conversation is appropriate.  LABORATORY:  His laboratory data was reviewed.  He was noted to have a troponin I of 0.05.  His total CK is trekking down.  The peak CK is 975, which has trekked down to 459.  His CK-MB was peaked at 64.7.  His last CK-MB is 30.4.  His last arterial blood gas reveals a pH 7.38, pCO2 39, pO2 57.  His white count has trekked down from 20.1 to 8.4.  The patient has a temperature of 101 noted today.  Hematocrit is 33.9 following surgical intervention.  Platelet count is at 113.  His potassium 3.4.  This has currently been replaced.  This most likely reflects a significant diuresis.  His creatinine has decreased from 4.6 on admission to 2.6.  His chest x-ray reveals a right lower lobe infiltrate.  IMPRESSION:  1. Elevated troponin and CKs.  It is most likely reflectant of his left of     his left ischemic leg.  His CK is currently trending down following his     thrombectomy.  He has had no chest pain.  He has no clinical indications     of cardiac ischemia.  There has been no EKG evidence of cardiac ischemia.     Further cardiac work-up is not indicated at this time unless the patient     develops clinical or electrocardiogram evidence of ischemia.  Would prefer     to wait until his renal status and pulmonary status are stable prior to     proceeding with a stress test.  2. 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  One must question the     etiology of this thrombus; however, performance of a transesophageal     echocardiography would not change the anticoagulation status of the     patient.  He will require long-term anticoagulation unless he develops     further contraindication.  3. Hypotension.  4. The patient currently is hemodynamically stable.  It is likely that the     hypotension and the  rhabdomyolysis contributed to his acute renal failure.   5. Thrombocytopenia.  The patient currently is on heparin drip.  He will need     to have a repeat complete blood count and alternatives to heparin may need     to be considered. DD:  08/18/00 TD:  08/19/00 Job: [id number]

## 2010-12-13 NOTE — Discharge Summary (Signed)
[place]. [facility]  Patient:    [name], [name]                   MRN: [id number] Adm. Date:  [id number] Disc. Date: [id number] Attending:  Georgann Housekeeper CC:         Quita Skye. Hart Rochester, M.D.  Meade Maw, M.D.   Discharge Summary  DISCHARGE DIAGNOSES: 1. Lower extremity embolism on the left leg. 2. Right upper lobe pneumonia. 3. Acute renal failure. 4. Chronic obstructive pulmonary disease. 5. Type 2 diabetes, new onset. 6. Thrombocytopenia. 7. Hypertension.  DISCHARGE MEDICATIONS:  1. Synthroid 150 mcg two tablets q.d.  2. Tequin 400 mg one tablet a day until August 30, 2000.  3. Prozac 20 mg q.d.  4. Pepcid 40 mg q.d.  5. Serevent inhaler two puffs b.i.d.  6. Albuterol nebulizer every six to eight hours p.r.n.  7. Amaryl 4 mg q.d.  8. Glucophage XR 500 mg two tablets in the evening.  9. Atrovent MDI two puffs three times a day. 10. Coumadin 3 mg q.d. 11. Percocet p.r.n.  ACTIVITY:  As per [facility].  No heavy lifting, bending, or weight on the left foot.  DIET:  Diabetic ADA diet and Coumadin.  No concentrated sweets.  Home glucose monitoring twice a day.  FOLLOW-UP:  With Quita Skye. Hart Rochester, M.D., in two weeks at [facility], Meade Maw, M.D., cardiology, in one week, and Georgann Housekeeper, M.D., in one week.  Diabetic education at [facility].  Coumadin check at the clinic on August 31, 2000.  HOSPITAL COURSE:  The patient was admitted from the clinic on August 17, 2000.  He came in with dizziness, weakness, and leg pain.  He was diaphoretic with a blood pressure of 70 in the office with the left toes purple and a cold foot.  He was sent to the emergency room and underwent a CT scan of his chest and abdomen.  This revealed a consolidated right upper lobe pneumonia.  He also had an ischemic left lower leg with no pulses below his popliteal. Vascular surgery was consulted.  They took him to the OR immediately for  left embolectomy.  He was also found to be in acute renal failure.  ISSUES: #1 - ACUTE RENAL FAILURE:  The patient was admitted with a creatinine of 4.6 and continued to have fluid hydration.  This was thought to be an ATN secondary to hypertension and underlying illness.  After the fluid hydration, the blood pressure was coming and his renal failure resolved.  His creatinine on discharge was 1.0.  UA was negative.  The urine sodium was 45.  #2 - RIGHT UPPER LOBE PNEUMONIA:  A blood culture and sputum culture were drawn.  The patient was started on Tequin, renal dose, and then was switched to a regular dose of Tequin.  He continued to improve after he was intubated for his left embolectomy.  He remained intubated for two days.  He initially he had trouble getting off intubation secondary to his vascular congestion. Pulmonary was consulted and they managed her in the ICU.  The patient was diuresed with Lasix and continued on nebulizer treatments.  He was successfully extubated and continues to do better.  His oxygen requirement tapered down.  At the time of discharge, his room air pulse oximetry is 94-95%.  #3 - CHRONIC OBSTRUCTIVE PULMONARY DISEASE, SEVERE:  The patient continued to require nebulizer treatments and also was started on a Serevent inhaler and Atrovent  MDI.  He was doing better.  #4 - LEFT LOWER LEG ISCHEMIA:  The patient was seen by [facility], Quita Skye. Hart Rochester, M.D., and was taken to the OR on the same day that he was admitted.  He had successful left leg femoral embolectomy.  He continues to improve his circulation to the leg.  His toes and the foot improved with pulses return. He has some redness in the first and second toes, which is thought to resolve over time.  His left femoral artery embolectomy was done successfully.  He was started on heparin and switched over to Coumadin.  He will remain on Coumadin for a long time.  He did have some mild thrombocytopenia, but his  platelet count remained stable.  He was continued on Coumadin.  The INR was at goal at the time of discharge with a Coumadin requirement of 3 mg/day.  He will follow up with [facility] in one to two weeks.  #5 - CARDIAC:  He had elevated CPK when he came in, but it was thought to be secondary to his lower leg ischemia.  His troponin was negative.  The patient had an echocardiogram while he was in the hospital which was normal LV function and a limited scan because of his underlying COPD.  The patient will have a cardiology stress test to evaluate his coronary risk as an outpatient. He was seen by cardiology, Meade Maw, M.D.  #6 - DIABETES:  The patients blood sugar was over 300 when he came in.  An A1C was done which revealed 12.1.  The patient was told that he has been diabetic for quite some time.  He was started on insulin while in the hospital with the sliding scale and later was switched to oral hypoglycemic agents, Amaryl and Glucophage.  At the time of discharge, his blood sugars were in the 160-170 range.  He is going to continue to follow up as an outpatient for his diabetes.  #7 - RHEUMATOID ARTHRITIS:  He did not require any medications.  He was Amaryl before this and that was stopped while he was in the hospital.  There was mild elevation in liver enzymes, which remained stable. DD:  09/04/00 TD:  09/05/00 Job: [id number]

## 2010-12-13 NOTE — Discharge Summary (Signed)
[name], [name]            ACCOUNT NO.:  [id number]   MEDICAL RECORD NO.:  [id number]          PATIENT TYPE:  INP   LOCATION:  0447                         FACILITY:  [facility]   PHYSICIAN:  Anselm Pancoast. Weatherly, M.D.DATE OF BIRTH:  Sep 10, 1940   DATE OF ADMISSION:  03/18/2004  DATE OF DISCHARGE:  03/29/2004                                 DISCHARGE SUMMARY   DISCHARGE DIAGNOSES:  1.  Small-bowel obstruction secondary to adhesions.  2.  Diabetes mellitus.   HISTORY:  [name] is a large Caucasian male, diabetic, who started  having severe abdominal pain approximately 24 hours prior to this admission.  At approximately 2 a.m., it became more intense.  He went to the [facility]  emergency room and was seen by the ER physician.  Abdominal films were  obtained, consistent with a small-bowel obstruction.  A CT with contrast was  then performed which showed like it  transitioned kind of in terminal ileum.  The patient refused NG tube and desired to be transferred to [facility].  I  was on call for ER, and the patient was accepted.   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 Orson Slick years ago, and then he has had an  aortic aneurysm repair by Dr. Quita Skye. Hart Rochester approximately two years ago.   MEDICATIONS:  He stated that he takes oral medication of Glucophage two 500  mg tablets b.i.d.  For blood pressure, he is also on a diuretic.  He denies  problems with angina.   ALLERGIES:  He has no known allergies.   HOSPITAL COURSE:  The patient was admitted.  NG tube, he agreed that I could  place it, and it went in without problems.  He had an enormous quantity of  NG drainage over the first 24 hours.  I gave him a big soap-suds enema as it  appeared to be stool in his colon. The following day, really the x-rays  still showed what looks like a small bowel obstruction.  He gave permission  to proceed on to surgery.   Dr. Adolph Pollack assisted, and he had  extensive intraabdominal adhesions, and it appeared that his point of  obstruction was the terminal ileum, but it was kind of over close to the  area where the previous aneurysm had been repaired, and the mobilization to  the left of the midline.  I could feel what felt like peanuts in his  stomach, a fairly large quantity.  I did not remove these since I did not  actually want to do a gastrostomy.  On postoperative discussion with the  patient, he had eaten peanuts with his daughter approximately three days  prior to the onset of this pain.   The patient postoperatively had NG tube continued, and the decreased NG  drainage over the first few days, and then he started passing flatus, and  his NG tube was removed.  We did not really start him on a significant diet,  but he was started on liquids.  Over the next 24 hours,  he bloated up again,  and had to have the NG tube replaced.  I replaced the NG tube.  After  approximately 48 hours, he started passing flatus again, and then the NG  tube was removed again.  He was started back on a liquid diet.  This time,  he tolerated it without problems.  I think in retrospect that probably he  had some areas of narrowing from the previous surgery, and I really wonder  if the peanuts which were raw peanuts, and he has very poor dental  condition, and he just swallowed them whole, and I suspect that this was the  actual reason he was truly obstructed.   The patient was ready to be discharged in improved condition on the 2nd.   DISCHARGE INSTRUCTIONS:  1.  He will continue on full liquids for approximately two days.  2.  He denied the use of pain medicine, but was given some Halcion 0.25 for      sleep for the next few days.   FOLLOWUP:  I will see him in the office in approximately four or five days,  and we will remove the skin staples at that time.  He has to be off work for  approximately three weeks.   He resumes his Glucophage and other chronic  medications.      WJW/MEDQ  D:  04/23/2004  T:  04/24/2004  Job:  [id number]

## 2010-12-13 NOTE — Discharge Summary (Signed)
[name], [name]            ACCOUNT NO.:  [id number]   MEDICAL RECORD NO.:  [id number]          PATIENT TYPE:  OBV   LOCATION:  1412                         FACILITY:  [facility]   PHYSICIAN:  Georgann Housekeeper, MD      DATE OF BIRTH:  09/23/1940   DATE OF ADMISSION:  02/10/2009  DATE OF DISCHARGE:  02/12/2009                               DISCHARGE SUMMARY   DISCHARGE DIAGNOSIS:  1. Chronic obstructive pulmonary disease exacerbation.  2. Diabetes.  3. Hypertension.   As far as the medication on discharge:  1. Continue with home medications plus prednisone 10 mg taper over 6      days.  2. Zithromax 250 mg daily for 4 days.   HOSPITAL COURSE:  A 70 year old with known history of COPD, diabetes,  hypertension, moderate arthritis who presented with no shortness of  breath and worsening with COPD exacerbation.  The chest x-ray did not  show pneumonia.  The patient was started on Solu-Medrol and  antibiotics.  His symptoms significantly improved the next day.  He was  switched to prednisone and p.o. azithromycin and to continue on these at  home.  He was continued on other medication including his Spiriva,  albuterol nebulizer and for diabetes he will continue his home  medications of Glucophage and insulin with monitoring blood sugar  closely.  He will follow with me in 1 week.   CONDITION ON DISCHARGE:  Stable.  Discharged home.  Follow-up in 1 week.      Georgann Housekeeper, MD  Electronically Signed     KH/MEDQ  D:  03/13/2009  T:  03/13/2009  Job:  [id number]

## 2010-12-13 NOTE — H&P (Signed)
[place]. [facility]  Patient:    [name], [name]                     MRN: [id number] Attending:  Georgann Housekeeper, M.D. Dictator:   Tillman Sers, N.P.                         History and Physical  DATE OF BIRTH:  05/08/1941  CHIEF COMPLAINT:  Lightheadedness, left leg pain.  HISTORY OF PRESENT ILLNESS:  [name] is a 70 year old white male patient of Dr. Donette Larry, who began feeling bad in general a couple of days ago. At that time he felt tired.  He had some aches, but nothing specific.  Yesterday a.m. he awakened with chills, fatigue, sensation of feeling hot and cold, but did not check his temperature.  He stayed in bed all day.  This morning when he awakened, the chills had resolved.  He also felt a little bit better, but he noticed some left lower back/hip pain that would radiate into his left foot, similar feeling that he gets when sciatica flares up.  What was different this time was that he had a severe left calf pain that lasted for a few minutes and lessened.  He still has the pain in the calf in addition to that in the left buttocks area radiating down to the leg.  Describes it as a numbness and aching and has noticed sensation decrease in the lower leg and foot area.  Since this morning, he has felt progressively lightheaded.  He states that he feels as if he has to concentrate or "focus" to walk down the hall without falling.  He denies any chills at present, but just has a sensation of not feeling well in general.  In the office he was found to be cool and clammy with a blood pressure of 70 systolic.  Left foot was also found to be cyanotic, particularly in the first two digits.  He is being admitted for the same.  PAST MEDICAL HISTORY:  Rheumatoid arthritis, hypothyroidism, depression.  PAST SURGICAL HISTORY:  Surgery for perforated bowel with no specific etiology found in the past.  L4-5 disk surgery by Dr. Fannie Knee.  Also had a  ganglion cyst removed by Dr. Teressa Senter.  REVIEW OF SYSTEMS:  Chills as above.  HEENT: No complaints.  CHEST: Denies any cough or chest pressure.  GASTROINTESTINAL: Denies any abdominal pain.  Has felt a little queasy on his stomach this morning, but has not vomited.  Denies any dyspnea.  BACK: As above.  Discomfort is mostly in the left lower sacroiliac area and hip area.  EXTREMITIES: He notes no swelling or redness. Has noticed the discoloration in his foot, specifically, the two toes. GENITOURINARY: No dysuria, hematuria.  NEUROLOGICAL: Slight headache, lightheadedness as above.  MEDICATIONS: 1. Synthroid 150 mcg q.d. 2. Prozac 20 mg q.d. (last dose August 15, 2000) 3. Enbrel for his rheumatoid arthritis was discontinued due to recent    increased elevated LFTs.  SOCIAL HISTORY:  He is married.  He has one daughter in her mid-30s.  FAMILY HISTORY:  Father died at 61 from emphysema.  He has one sister in [place].  ALLERGIES:  No known drug allergies.  PHYSICAL EXAMINATION:  VITAL SIGNS:  Weight 228 pounds which is down from 245 in September of last year.  Temperature afebrile.  O2 saturation 88% on room air, pulse 72, blood pressure 70 systolic.  GENERAL:  He is pale, diaphoretic.  Is alert and oriented x 3 and gives an excellent history.  HEENT:  Eyes; PERRLA.  Ears, nose, and throat unremarkable.  NECK:  No adenopathy, no JVD.  HEART:  Regular rate and rhythm without murmur.  LUNGS:  Clear.  ABDOMEN:  Soft and nondistended.  Bowel sounds present in all four quadrants. No abdominal tenderness is noted.  No flank tenderness.  BACK:  There is a well-healed surgical site from back surgery years back.  EXTREMITIES:  Hip with no tenderness in either.  Left groin pulse is +2 and symmetrical with right lower extremity examination reveals no swelling. He does have some cyanosis of the upper left portion of the foot, particularly the first two digits.  Capillary refill is  poor and I do not feel a pulse on that side.  Right pulse is +1 to 2 in the dorsalis pedis area.  IMPRESSION:  Hypotension, left leg pain, recent chills.  PLAN:  Unsure of etiology, but will admit to emergency room for IV fluids, lab testing, and CT scan of the chest, abdomen, and lower extremities.  Dr. Donette Larry will follow. DD:  08/17/00 TD:  08/17/00 Job: [id number]

## 2010-12-13 NOTE — Cardiovascular Report (Signed)
[place]. [facility]  Patient:    [name], [name]                   MRN: [id number] Proc. Date: 09/25/00 Adm. Date:  [id number] Disc. Date: [id number] Attending:  Meade Maw A CC:         Tyson Dense, M.D.   Cardiac Catheterization  REFERRING PHYSICIAN:  Tyson Dense, M.D.  INDICATIONS FOR PROCEDURE:  Dyspnea with decreased uptake in the anterior wall which may have been secondary to the left bundle-branch block, a fixed defect in the inferior wall with normal wall motion thought to be most likely diaphragmatic attenuation.  The patient has a history significant for possible embolic event to the left lower leg.  COPD.  Type 2 diabetes.  History of renal insufficiency during the embolic event.  Thrombocytopenia. Hypertension.  DESCRIPTION OF PROCEDURE:  After obtaining written informed consent, the patient was brought to the cardiac catheterization lab.  Preoperative sedation was achieved with IV Versed, IV fentanyl.  The right groin was prepped and draped in the usual sterile fashion.  Local anesthesia was achieved using 1% Xylocaine.  A 6 French hemostasis sheath was placed into the right femoral artery using a modified Seldinger technique.  Selective coronary angiography was performed using a JL4, JR4 Judkins catheter.  Nonionic contrast was used and was hand injected.  Multiple views were obtained.  Single plane ventriculogram was performed in the RAO position using a 6 French pigtail curved catheter.  FINDINGS:  His aortic pressure is 156/88, LV pressure was 156/22.  Single plane ventriculogram revealed normal wall motion, ejection fraction of approximately 65-70%.  No mitral regurgitation was noted.  CORONARY ANGIOGRAPHY:  Left main coronary artery:  The left main coronary artery was a large artery, bifurcated into the left anterior descending and circumflex vessel.  There was no significant disease in the left main coronary  artery.  Left anterior descending:  The left anterior descending gave rise to a moderate sized bifurcating diagonal #1, small diagonal #2, diagonal #3 and ended as an apical recurrent branch.  There was no significant disease in the left anterior descending or its branches.  Circumflex vessel:  The circumflex vessel was a large vessel that gave rise to a AV groove vessel, small OM-1, small OM-2, and a large OM-3 branch.  The circumflex vessel went on to end as an AV groove vessel.  There was no significant disease in the circumflex or its branches.  Right coronary artery:  The right coronary artery was a large dominant artery that gave rise to a moderate sized PDA and a PL branch.  There was a large ulcerated plaque noted in the proximal to midportion of the right coronary artery.  Multiple views were obtained of this plaque.  The plaque did not appear to be flow-limiting.  The films were then reviewed with Dr. Verdis Prime, in the absence of ischemia in the inferior wall and was felt that intervention on the right coronary artery should not be considered.  IMPRESSION: 1. 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 2. Preserved left ventricular function. 3. Embolic event to the left leg.  RECOMMENDATIONS: 1. To continue with medical management.  The patient will be restarted on his    Coumadin to maintain an INR of 2 to 3.  Intervention will only be performed    on the right coronary artery should the patient demonstrate ischemia, which    he has not done so  at this time. 2. Health maintenance:  Fasting lipid profile should be obtained on the    patient and currently do not have this available for review.  His LDL    goals should be 100. 3. Hypertension:  His blood pressure appears to be currently controlled.    Consideration to initiation of ACE in view of his diabetes may be    considered.  I will see the patient back in the office in two weeks for     further discussion. DD:  09/25/00 TD:  09/28/00 Job: [id number]

## 2010-12-13 NOTE — H&P (Signed)
NAME:  [name], [name]                      ACCOUNT NO.:  [id number]   MEDICAL RECORD NO.:  [id number]                   PATIENT TYPE:  INP   LOCATION:  NA                                   FACILITY:  [facility]   PHYSICIAN:  Dakota Skye. Hart Aguilar, M.D.               DATE OF BIRTH:  1941/04/10   DATE OF ADMISSION:  08/04/2002  DATE OF DISCHARGE:                                HISTORY & PHYSICAL   HISTORY OF PRESENT ILLNESS:  The patient is a 70 year old male who underwent  left femoral embolectomy in January 2002.  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  He has been followed by Dr. Hart Aguilar for this  from the time of the embolectomy.  The size of his aneurysm has progressed  significantly in the last six months, and an aorta ultrasound on 06/21/2002  demonstrated a 4.8 cm transverse diameter to the abdominal aortic aneurysm  versus 4.0 cm six months ago versus 3.4 cm in May 2002.  The patient denies  any recent history of back pain or claudication symptoms.  He has no  evidence of embolic descent to the lower extremities.  He has no recent  fever or chills or weight loss or gain.  He presents for resection and  grafting of abdominal aortic aneurysm on 08/04/2002.   PAST MEDICAL HISTORY:  1. Type 2 diabetes mellitus.  2. Hypothyroidism.  3. Hypertension.  4. COPD.  5. History of ischemia of left leg secondary to femoral embolus in January     2002.   PAST SURGICAL HISTORY:  1. Status post left femoral embolectomy January 2002, Dr. Hart Aguilar.  2. Status post lumbar laminectomy in 1971.  3. Status post resection of the ileum secondary to bleeding several years     ago.   MEDICATIONS:  1. Lipitor 10 mg daily.  2. Altace 2.5 mg daily.  3. Synthroid 200 mcg daily.  4. Prozac 20 mg daily.  5. Gemfibrozil 600 mg twice daily.  6. Amaryl 4 mg twice daily.  7. Enteric-coated aspirin 81 mg daily.  8. Glucophage XR 500 mg 2 tablets twice daily.   ALLERGIES:  No known drug  allergies.   REVIEW OF SYSTEMS:  NEUROLOGIC:  He has no history of cerebrovascular  accident, transient ischemic attack, syncope, presyncope, amaurosis fugax.  CARDIAC: No prior history of myocardial infarction, dysrhythmias,  palpitations, or known coronary artery disease.  GASTROINTESTINAL:  He has a  history of resection of the ileum secondary to bleeding.  This was dealt  without placement of ileostomy.  He has no history of GERD or GI bleed.  No  history of peptic ulcer disease.  VASCULAR:  He has known abdominal aortic  aneurysm with history of left femoral embolus causing acute onset of left  leg ischemia.  He has no history of claudication.  Ankle brachial indices  are greater than 1.0 bilaterally on ultrasound at the  office 08/02/2002.  He  has no history of carotid disease, and his internal carotid arteries are  widely patent by carotid duplex ultrasound at the office of [facility] 08/02/2002.   SOCIAL HISTORY:  He has been married for the past 40 years.  He has one  child who is doing well.  He works in [employer].  He has a history of  tobacco habituation, having quit two years ago.  Prior to that, his smoking  history is 43 years at two and one-half packs per day.  He does not partake  of alcoholic beverages.   FAMILY HISTORY:  Mother died at age 2.  She was murdered by her husband who  spent his life in prison.  The patient has only seen his father twice since  that time.  He has a sister three years older who had a history of breast  cancer treated surgically, still living and doing well.   PHYSICAL EXAMINATION:  GENERAL:  Alert and oriented male in no acute  distress.  HEENT:  Normocephalic and atraumatic.  Eyes:  Pupils are equal, round, and  reactive to light.  Extraocular movements intact.  Oropharynx shows that he  wears a partial upper denture.  Dentition is in good repair.  VITAL SIGNS:  Blood pressure left upper extremity 132/62.  NECK:  Supple with no jugular  venous distention, no carotid bruits  auscultated.  LUNGS:  Clear to auscultation and percussion bilaterally.  There is slight  diminishment at both bases.  HEART:  Regular rate and rhythm without murmurs, rubs, or gallops.  ABDOMEN:  Soft, nondistended.  He is mildly obese.  Bowel sounds are present  throughout.  Could not palpate his abdominal aortic aneurysm.  EXTREMITIES:  No evidence of clubbing, cyanosis, ulcerations, or edema.  He  has good perfusion to both feet, palpable pedal pulses bilaterally.  NEUROLOGIC:  No focal deficits.  Gait is steady.   IMPRESSION:  Progressive increase in size of abdominal aortic aneurysm.   PLAN:  Resection and grafting of abdominal aortic aneurysm by Dr. Hart Aguilar  08/04/2002.     Maple Mirza, P.A.                    [name], M.D.    GM/MEDQ  D:  08/02/2002  T:  08/02/2002  Job:  [id number]   cc:   Kyra Searles, M.D.   Meade Maw, M.D.  [address]  [place]  [place] [address]  Fax: [phone]

## 2010-12-13 NOTE — Discharge Summary (Signed)
NAME:  [name], [name]                      ACCOUNT NO.:  [id number]   MEDICAL RECORD NO.:  [id number]                   PATIENT TYPE:  INP   LOCATION:  2018                                 FACILITY:  [facility]   PHYSICIAN:  Quita Skye. Hart Rochester, M.D.               DATE OF BIRTH:  04-20-1941   DATE OF ADMISSION:  08/04/2002  DATE OF DISCHARGE:  08/10/2002                                 DISCHARGE SUMMARY   PRIMARY ADMITTING DIAGNOSIS:  Infrarenal abdominal aortic aneurysm.   ADDITIONAL/DISCHARGE DIAGNOSES:  1. Infrarenal abdominal aortic aneurysm.  2. Type 2 diabetes mellitus.  3. Hypothyroidism.  4. Hypertension.  5. Chronic obstructive pulmonary disease.  6. History of prior left femoral embolectomy for left leg ischemia.  7. Depression.   PROCEDURES PERFORMED:  1. Repair of abdominal aortic aneurysm with aortobiiliac (right common iliac     and left external iliac) bypass using a 14 x 8 mm Hemashield graft.  2. Reimplantation of inferior mesenteric artery into aortic graft.   HISTORY OF PRESENT ILLNESS:  The patient is a 70 year old white male who,  approximately one year ago, developed acute ischemia of his left leg  secondary to a femoral embolus.  He underwent a femoral embolectomy by Dr.  Hart Rochester.  During that time, he was diagnosed with an abdominal aortic  aneurysm.  Since then, he has been followed with serial ultrasounds and most  recently was noted to have an increase in the size of his aneurysm.  In May  of 2002, the aneurysm was 3.4 cm, however, in November of 2003, the aneurysm  had increased in size to 4.8 cm in diameter.  He has had no symptoms of  abdominal or back pain; however, it is felt that in light of the significant  increase in size over such a short period of time, he should undergo  surgical repair at this time.   HOSPITAL COURSE:  The patient was admitted on August 04, 2002, and taken to  the operating room where he underwent the above noted procedures.   He  tolerated this well and was transferred to the SICU in stable condition.  Postoperatively he has done well.  He was extubated shortly after surgery.  He remained NPO but was able to mobilize on postoperative day #1.  By  postoperative day #2, he was transferred to the floor.  As his GI function  has slowly returned, he has been started on clear liquids and subsequently  advanced to a regular soft diet. He is tolerating this well at present.  He  is having normal bowel and bladder function.  His surgical incision is  healing well.  He has been ambulating in the halls without difficulty.  He  has remained afebrile and all vital signs have been stable.  He has been  restarted on home medications and his blood sugars have remained relatively  stable.  It is felt that if  he continues to do well and his GI function  continues to progress, he will be ready for discharge home on August 10, 2002.   DISCHARGE MEDICATIONS:  1. Tylox one to two q.4h. p.r.n. for pain.  2. Lipitor 10 mg q.d.  3. Altace 2.5 mg q.d.  4. Synthroid 200 mcg q.d.  5. Prozac 20 mg q.d.  6. Gemfibrozil 600 mg b.i.d.  7. Amaryl 4 mg b.i.d.  8. Enteric coated aspirin 81 mg q.d.  9. Glucophage XR 1000 mg b.i.d.   DISCHARGE INSTRUCTIONS:  He is to refrain from driving, heavy lifting or  strenuous activity.  He may continue daily walking and use of his incentive  spirometer.  He is asked to shower daily and clean his incisions with soap  and water.  He will continue soft diabetic diet.   FOLLOW UP:  He will return to the [facility] office on Tuesday, August 16, 2002,  at 9:30 a.m. for staple removal and wound check.  He will then see Dr.  Hart Rochester and have repeat ABIs on Tuesday, August 30, 2002, at 3:30 p.m.  He  is asked to call our office if he has any problems or questions in the  interim.      Coral Ceo, P.A.                        Quita Skye Hart Rochester, M.D.    GC/MEDQ  D:  08/09/2002  T:  08/09/2002  Job:   [id number]   cc:   Meade Maw, M.D.  [address]  [place]  [place] [address]  Fax: [phone]   Georgann Housekeeper, M.D.  [address]  [place]  [place] [address]  Fax: [phone]

## 2010-12-13 NOTE — Op Note (Signed)
NAME:  [name], [name]                      ACCOUNT NO.:  [id number]   MEDICAL RECORD NO.:  [id number]                   PATIENT TYPE:  OIB   LOCATION:  2854                                 FACILITY:  [facility]   PHYSICIAN:  Quita Skye. Hart Rochester, M.D.               DATE OF BIRTH:  1940/10/11   DATE OF PROCEDURE:  07/13/2002  DATE OF DISCHARGE:                                 OPERATIVE REPORT   PREOPERATIVE DIAGNOSIS:  Infrarenal abdominal aortic aneurysm.   POSTOPERATIVE DIAGNOSIS:  Infrarenal abdominal aortic aneurysm.   PROCEDURE:  Biplane abdominal aortogram with bilateral lower extremity  runoff via right common femoral approach.   SURGEON:  Quita Skye. Hart Rochester, M.D.   ANESTHESIA:  Local with Xylocaine.   COMPLICATIONS:  None.   DESCRIPTION OF PROCEDURE:  The patient was taken to the [facility]  peripheral endovascular laboratory and placed in the supine position at  which time the right groin was prepped with Betadine solution and draped in  routine sterile manner. After infiltration of 1% Xylocaine, the right common  femoral artery was entered percutaneously.  A guide wire was passed into the  suprarenal aorta under fluoroscopic guidance.  The 5 French sheath and  dilator were passed over the guide wire and the dilator was removed.  A  graduated pigtail catheter was positioned in the suprarenal aorta.  Flush  abdominal aortogram was performed injecting 20 cc of contrast at 20 cc per  second in both the AP and lateral projections.  This revealed the aorta to  be widely patent with single widely patent renal arteries bilaterally and  prompt filling of the superior mesenteric artery without stenosis.  There  was a generous infrarenal neck approximately 3 to 4 cm in length and a  saccular infrarenal abdominal aortic aneurysm extending down to the aortic  bifurcation.  Both common iliac arteries were non-aneurysmal but irregular.  Both internal iliac arteries were patent with a  moderate stenosis at the  origin of the right internal iliac artery and atherosclerotic changes in  both.  Both external iliac arteries were widely patent with no evidence of  significant stenosis.  The catheter was withdrawn into ithe terminal aorta  and bilateral lower extremity runoff performed injecting 8 cc of contrast at  8 cc per second.  This revealed the iliac systems to be widely patent as  noted with some mild irregularity of the right common femoral artery but no  significant stenosis.  The right superficial femoral artery was widely  patent with some mild irregularity in the distal third but no stenosis.  The  right popliteal artery was relatively large but not clearly aneurysmal and  there was a high origin of the right posterior tibial artery beginning at  the knee joint. The best runoff is through the posterior tibial and the  anterior tibial arteries on the right.  The left leg had a patent  superficial femoral  artery with some mild irregularity in the lower third of  the thigh and also a widely patent popliteal artery with a high origin of  the posteriori tibial artery on the left with runoff through the posterior  tibial and anterior tibial arteries.  RAO and LAO projections of the pelvis  were also obtained to better delineate the iliac anatomy.  Following this,  the pigtail catheter was removed over the guide wire and the sheath was  removed.  Adequate compression was applied.  No complications ensued.   FINDINGS:  1. Infrarenal abdominal aortic aneurysm extending to aortic bifurcation.  2. Atherosclerotic changes in both common iliac arteries with no significant     stenosis.  3.     Mild distal superficial femoral occlusive disease with no stenosis on the     left.  4. High origin of both posterior tibial arteries at the knee level with     runoff primarily to the posterior tibial and anterior tibial arteries     bilaterally.                                                Quita Skye Hart Rochester, M.D.    JDL/MEDQ  D:  07/13/2002  T:  07/13/2002  Job:  [id number]

## 2010-12-13 NOTE — Op Note (Signed)
NAME:  [name], [name]                      ACCOUNT NO.:  [id number]   MEDICAL RECORD NO.:  [id number]                   PATIENT TYPE:  INP   LOCATION:  2873                                 FACILITY:  [facility]   PHYSICIAN:  Quita Skye. Hart Rochester, M.D.               DATE OF BIRTH:  [birth year]   DATE OF PROCEDURE:  08/04/2002  DATE OF DISCHARGE:                                 OPERATIVE REPORT   PREOPERATIVE DIAGNOSIS:  Infrarenal abdominal aortic aneurysm.   POSTOPERATIVE DIAGNOSIS:  Infrarenal abdominal aortic aneurysm.   OPERATION:  Resection and grafting of infrarenal abdominal aortic aneurysm  with insertion of an aorto bi-iliac (right common iliac and left external  iliac) bypass using a 14 x 8 mm Hemashield Dacron graft, plus reimplantation  of inferior mesenteric artery endoaortic graft.   SURGEON:  Quita Skye. Hart Rochester, M.D.   FIRST ASSISTANT:  Di Kindle. Edilia Bo, M.D.   SECOND ASSISTANT:  Loura Pardon.   ANESTHESIA:  General endotracheal anesthesia.   DESCRIPTION OF PROCEDURE:  The patient was taken to the operating room and  placed in the supine position, at which time satisfactory general  endotracheal anesthesia was administered. A Swann-Ganz catheter and a radial  arterial line were inserted by anesthesia. The abdomen and groins were  prepped with Betadine scrub and solution and draped in a routine sterile  manner.   A midline incision was made in the abdomen through the previous scar and was  carried down through the subcutaneous and linea alba using the Bovie. The  peritoneal cavity was entered and thoroughly explored.   There were some adhesions between the greater omentum and the anterior  abdominal wall which were taken down using the electrocautery and the  Metzenbaum scissors. The liver was smooth and no abnormalities were noted.  The gallbladder appeared normal with no stones being palpable. The stomach,  duodenum, small bowel and colon were unremarkable.   The intestines were reflected to the right side. The transverse colon was  elevated and the retroperitoneum was incised, exposing an aortic aneurysm  which was located in the infrarenal aorta, beginning about 3 cm distal to  the renal arteries and extending down to the aortic bifurcation. The left  common iliac artery was also aneurysmal, being about twice normal size. The  right iliac artery was normal sized but had a posterior plaque and was  widely patent.   The left external iliac artery was exposed through a separate incision in  the pelvis and a tunnel  created posterior to the ureter. The patient was  given 25 gm of mannitol and then heparinized. The aorta was occluded distal  to the renal arteries. The left common iliac artery aneurysm was ligated  distally and the right common iliac artery was transected about 3 cm  proximal to its bifurcation.   The lumbars were oversewn with 2-0 silk sutures from within the aneurysm.  The neck of the  aneurysm was transected and a  14 x 8 mm Hemashield Dacron graft was anastomosed end-to-end with the aortic  stump using continuous 3-0 Prolene. This was buttressed with a strip of  felt. It was checked for leaks and none were present.   On the left the limb was delivered posterior to the ureter and anastomosed  end-to-side to the external iliac artery distally with 5-0 Prolene after  spatulating the graft. The left leg was opened with no significant  hypotension. The right limb of the graft was anastomosed to the common iliac  artery in an end-to-end fashion with 5-0 Prolene. The right leg was opened  with no hypotension.   Following this the inferior mesenteric artery which had been occluded at its  origin was transected and checked for back bleeding and it was quite  sluggish with no pulsatile back bleeding being noted. Therefore, it was  reimplanted into the aortic graft after partially occluding it with a Cooley  clamp, opening it with an  11 blade and enlarging it with a 4 mm punch.   After this was opened, Protamine was given to reverse the heparin. Following  adequate hemostasis, the wound was irrigated with saline. The aneurysm was  closed over the graft with 3-0 Vicryl. The retroperitoneum incision was then  closed with 3-0 Vicryl, and following thorough irrigation, the linea alba  was closed with #1 Prolene and the skin with clips. A sterile dressing was  applied.   The patient was then to the recovery room in satisfactory condition,  extubated. He received 2 units of blood from the [employer]; no blood bank  transfusions. He had an excellent urinary output.                                               Quita Skye Hart Rochester, M.D.    JDL/MEDQ  D:  08/04/2002  T:  08/04/2002  Job:  [id number]

## 2010-12-13 NOTE — H&P (Signed)
NAME:  [name]                      ACCOUNT NO.:  [id number]   MEDICAL RECORD NO.:  [id number]                   PATIENT TYPE:  INP   LOCATION:  0102                                 FACILITY:  [facility]   PHYSICIAN:  Anselm Pancoast. Zachery Dakins, M.D.          DATE OF BIRTH:  07/22/1941   DATE OF ADMISSION:  03/18/2004  DATE OF DISCHARGE:                                HISTORY & PHYSICAL   CHIEF COMPLAINT:  1. Abdominal pain.  2. Nausea and vomiting.   HISTORY:  [name] is a 70 year old, large, Caucasian male who  stated that he started having severe abdominal pain yesterday evening.  About 2 a.m., it became more intense.  This morning, he went to the  [facility] Emergency Room where he was seen by the ER physician.  Plane  abdominal films are consistent with a small bowel obstruction, not maximally  dilated small bowel.  Then a CT with oral contrast was performed which  showed what looked like a transition in the terminal ileum.  The patient  refused a nasogastric tube and desired to be transferred to [place],  [place].  I was called by the ER physician.   PAST HISTORY:  Years ago he had some type of perforated intestines.  It was  not an appendicitis, but he was operated on with some type of intestinal  surgery.  Then over the last two to three years he has had several vascular  procedures by Dr. Hart Rochester, embolectomies and an infrarenal aneurysm repaired  in January of 2004.  He states that he has not had previous episodes of  nausea and vomiting until last evening.  He did start having pain in the  scrotal area and groin.  He was diagnosed with shingles.  He was treated  with powders and pain medication over this past weekend.  In the emergency  room at [facility], he had a CBC which showed a white count to be normal and  a differential with slightly elevated __________.  He is a noninsulin-  dependent diabetic and is controlled with Glucophage two 500 mg  tablets  b.i.d.  The patient states he is on Synthroid of which he takes two tablets.  He says he is on 275 mcg daily.  He is on Tylenol No. 3 and Amaryl.  He has  had a previous laminectomy by Dr. Fannie Knee years ago.  He denies high blood  pressure or angina.  I do not think he has any known allergies.  I do not  think he smokes.  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   PHYSICAL EXAMINATION:  GENERAL APPEARANCE:  He is a pleasant male.  Appears  his stated age.  Quite large.  He has a distended abdomen.  HEENT:  Mucosa adequately hydrated.  LYMPHATICS:  There are no cervical or supraclavicular nodes.  LUNGS:  Good  breath sounds bilaterally.  CARDIAC:  Normal sinus rhythm.  ABDOMEN:  He has a midline incision.  He has a very protuberant abdomen and  mildly tender in the lower abdomen, but it is very minimal.  There is no  evidence of any inguinal hernias and none noted on the CT.  He does have  femoral pulses bilaterally.  RECTAL:  I did not do a rectal exam on him, but the CT showed a small amount  of stool in the colon.  CENTRAL NERVOUS SYSTEM:  Appears physiologically.  GENITALIA:  He has some excoriation on his scrotum.  Whether it is heat or  whether it is shingles, I am not sure.   The CTs that he brought in were reviewed.  It looks like the transition is  about 2 feet down from the ileocecal valve area.  You can see he has had  some type of staple anastomosis in the bowel slightly proximal to this, sort  of the area I think a Meckel's diverticulum would be.  The appendix is  present and appears to be unremarkable.  There is stool in the right colon  that is not grossly distended.   PLAN:  A nasogastric tube was placed and 2 L of sulcus entericus bilious  were removed and the patient states he is feeling better afterwards.  I am  going to treat him conservatively for a few hours, repeat his CBC and BMET   and then reassess him in the morning if his abdominal tenderness continues  to decrease on abdominal x-rays.  He is aware that if he is having increase  in pain, fever and elevated white count, a laparotomy will be needed.  This  is probably secondary to adhesions in the lower right abdomen.  You can see  a loop of small bowel that is kind of twisted over the cecal area as it is  the possible point of obstruction.                                               Anselm Pancoast. Zachery Dakins, M.D.    WJW/MEDQ  D:  03/18/2004  T:  03/18/2004  Job:  [id number]

## 2010-12-13 NOTE — Procedures (Signed)
[place]  Patient:    [name], [name] Visit Number: [id number] MRN: [id number]          Service Type: CAT Location: [facility] 2859 01 Attending Physician:  Mora Appl Dictated by:   Francisca December, M.D. Proc. Date: 12/30/01 Admit Date:  12/30/2001 Discharge Date: 12/30/2001   CC:         Tyson Dense, M.D.  Meade Maw, M.D.   Procedure Report  PROCEDURES PERFORMED:  Fractional flow reserve determination, right coronary artery.  INDICATIONS:  The patient is a 70 year old man with exertional dyspnea and an abnormal Cardiolite showing a reversible apical defect.  He has undergone coronary angiography by Dr. Candace Cruise revealing a 70-80% stenosis in a small diagonal branch and a 50% ulcerated irregular lesion in the right coronary. He is to undergo a fractional flow reserve determination to assess the physiologic significance of this right coronary lesion.  PROCEDURAL NOTE:  The patient was brought back to the cardiac catheter laboratory with a #6 [place] previously placed right femoral artery catheter sheath that was exchanged over a long guiding J wire for a new #6 [place] sheath.  A #6 [place] JR4 diagnostic catheter was advanced to the ascending aorta where the right coronary os was engaged.  This was followed by 1500 units of heparin intravenously.  The patient had already received 3000 units of heparin earlier in the day and was on a constant infusion in the holding area.  The ACT was 163 seconds.  The 0.014-inch WaveWire was advanced to the tip of the diagnostic catheter such that the transducer was just outside the orifice.  The pressure was normalized and the wire was advanced across the lesion.  There was no resting gradient.  The patient was then administered 18 mcg, 24 mcg, and then 36 mcg, on two occasions, intracoronary injections of adenosine.  The greatest fractional flow reserve ratio obtained was 0.96.  The  guidewire and diagnostic catheter were then removed as well as the sheath. Hemostasis was achieved by direct pressure.  The patient was transported to the recovery area in stable condition with an intact distal pulse.  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  Medical therapy only is indicated. Dictated by:   Francisca December, M.D. Attending Physician:  Mora Appl DD:  12/30/01 TD:  01/02/02 Job: [id number]

## 2011-01-28 ENCOUNTER — Ambulatory Visit (INDEPENDENT_AMBULATORY_CARE_PROVIDER_SITE_OTHER): Payer: Medicare Other | Admitting: Internal Medicine

## 2011-01-28 ENCOUNTER — Encounter: Payer: Self-pay | Admitting: Internal Medicine

## 2011-01-28 VITALS — BP 138/78 | HR 94 | Temp 98.5°F | Resp 18 | Wt 254.1 lb

## 2011-01-28 DIAGNOSIS — N400 Enlarged prostate without lower urinary tract symptoms: Secondary | ICD-10-CM

## 2011-01-28 DIAGNOSIS — R609 Edema, unspecified: Secondary | ICD-10-CM

## 2011-01-28 DIAGNOSIS — R600 Localized edema: Secondary | ICD-10-CM

## 2011-01-28 DIAGNOSIS — E119 Type 2 diabetes mellitus without complications: Secondary | ICD-10-CM

## 2011-01-28 DIAGNOSIS — E039 Hypothyroidism, unspecified: Secondary | ICD-10-CM

## 2011-01-28 MED ORDER — FUROSEMIDE 40 MG PO TABS: 40.0000 mg | ORAL_TABLET | Freq: Every day | ORAL | Status: DC

## 2011-01-28 MED ORDER — LEVOTHYROXINE SODIUM 25 MCG PO TABS: 25.0000 ug | ORAL_TABLET | Freq: Every day | ORAL | Status: DC

## 2011-01-28 MED ORDER — INSULIN GLARGINE 100 UNIT/ML ~~LOC~~ SOLN: 70.0000 [IU] | Freq: Every day | SUBCUTANEOUS | Status: DC

## 2011-01-28 NOTE — Assessment & Plan Note (Addendum)
Improved with 3 days lasix, ok to continue as is, with f/u lab next visit or at [facility] per pt preference, declines echo at this time

## 2011-01-28 NOTE — Assessment & Plan Note (Signed)
undertreated  On high dose synthroid to begin with;  Will add synthroid .025 mg to take inaddition; f/u here in 4 wks if not followed at the [place]

## 2011-01-28 NOTE — Assessment & Plan Note (Addendum)
Severe uncontrolled - to increase the lantus to 70 units per day, f/u at [facility] endo or here as he sees fit  Lab Results  Component Value Date   HGBA1C 10.5* 12/05/2010

## 2011-01-28 NOTE — Progress Notes (Signed)
Subjective:    Patient ID: [name], male    DOB: 12-16-1940, 70 y.o.   MRN: [id number]  HPI  Here with 1 wk grad increase in symmetric bilat LE swelling;  Pt denies chest pain, increased sob or doe, wheezing, orthopnea, PND, palpitations, dizziness or syncope. Still sleeping on 2 pillows, no change.  Did have some "extra" lasix 40 mg at home and has taken daily for the last 3 days with improved LE swellin - not nearly as tight today as yesterday, but is now out completely.  No new rx or OTC meds.   Pt denies fever, wt loss, night sweats, loss of appetite, or other constitutional symptoms - just "dont feell good"   - c/o lack of energy.  Sees Endo at [facility] ;  Pt sent her our most recent results TSH elevated approx 13 but no response from her.  Also still sugar running high despite increase of the lantus to 60.   Pt denies new neurological symptoms such as new headache, or facial or extremity weakness or numbness   Pt denies polydipsia, polyuria, or low sugar symptoms such as weakness or confusion improved with po intake.  Pt states overall good compliance with meds, trying to follow lower cholesterol, diabetic diet, wt overall stable but little exercise however.    Denies hyper or hypo thyroid symptoms such as voice, skin or hair change.  Also with mild urinary hesitancy and diffictuly to pass all urine, despite good complaince with med Past Medical History  Diagnosis Date  . THRUSH 11/06/2009  . HYPOTHYROIDISM 07/30/2009  . DIABETES MELLITUS, TYPE II 11/23/2009  . DEPRESSION 11/23/2009  . 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 HYPERTENSION 07/30/2009  . CORONARY ARTERY DISEASE 11/23/2009  . CHRONIC OBSTRUCTIVE PULMONARY DISEASE, ACUTE EXACERBATION 03/01/2010  . C O P D 07/30/2009  . 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 RESPIRATORY FAILURE, CHRONIC 07/31/2009  . BENIGN PROSTATIC HYPERTROPHY 11/23/2009  . DEGENERATIVE JOINT DISEASE 11/23/2009  . 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 FATIGUE 11/23/2009  .  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 GAIT DISTURBANCE 12/10/2009  . DYSPNEA/SHORTNESS OF BREATH 12/08/2009  . HEMOPTYSIS UNSPECIFIED 05/07/2010   Past Surgical History  Procedure Date  . Small bowel obstruction repair with adhesiolysis   . Hx of remote ileum resection due to bleeding   . Abdominal aortic aneurysm repair   . S/p back surgury  1972    lumbar laminectomy Dr. Fannie Knee  . S/p left femoral embolectomy with left leg ischemia     Dr. Hart Rochester, vascular  . S/p right ganglion cyst     Dr. Teressa Senter    reports that he has quit smoking. He does not have any smokeless tobacco history on file. He reports that he does not drink alcohol or use illicit drugs. family history is not on file. No Known Allergies Current Outpatient Prescriptions on File Prior to Visit  Medication Sig Dispense Refill  . albuterol (PROAIR HFA) 108 (90 BASE) MCG/ACT inhaler Inhale 2 puffs into the lungs 4 (four) times daily.        [name] aspirin 81 MG tablet Take 81 mg by mouth daily.        . Cholecalciferol (VITAMIN D3) 2000 UNITS TABS Take by mouth daily.        [name] doxepin (SINEQUAN) 10 MG capsule Take 10 mg by mouth 2 (two) times daily.        [name] doxepin (SINEQUAN) 50 MG capsule Take 50 mg by mouth at bedtime.        [name]  finasteride (PROSCAR) 5 MG tablet Take 5 mg by mouth daily.        . formoterol (FORADIL AEROLIZER) 12 MCG capsule for inhaler Inhale contents of 1 capsule two times a day       . gabapentin (NEURONTIN) 800 MG tablet Take 800 mg by mouth 2 (two) times daily.        . hydrochlorothiazide 25 MG tablet 1/2 by mouth once daily       . insulin aspart (NOVOLOG) 100 UNIT/ML injection Sliding scale as needed with meals       . levothyroxine (SYNTHROID, LEVOTHROID) 200 MCG tablet 1 tablet by mouth every morning and every evening       . losartan (COZAAR) 100 MG tablet 1/2 by mouth once daily       . metFORMIN (GLUCOPHAGE) 1000 MG tablet Take 1,000 mg by mouth 2 (two) times daily.        . mometasone (ASMANEX 60 METERED DOSES) 220  MCG/INH inhaler Inhale 2 puffs into the lungs daily.        . Multiple Vitamins-Minerals (MEGA MULTIVITAMIN FOR MEN PO) Take by mouth daily.        . niacin (SLO-NIACIN) 500 MG tablet Take 500 mg by mouth at bedtime.        [name] rOPINIRole (REQUIP) 1 MG tablet Take 1 mg by mouth at bedtime.        . simvastatin (ZOCOR) 20 MG tablet 1/4 by mouth once daily       . Tamsulosin HCl (FLOMAX) 0.4 MG CAPS Take by mouth at bedtime.        [name] tiotropium (SPIRIVA HANDIHALER) 18 MCG inhalation capsule Place 18 mcg into inhaler and inhale daily.        [name] zolpidem (AMBIEN) 5 MG tablet Take 1 tablet (5 mg total) by mouth at bedtime as needed for sleep.  30 tablet  5   Review of Systems Review of Systems  Constitutional: Negative for diaphoresis and unexpected weight change.  HENT: Negative for drooling and tinnitus.   Eyes: Negative for photophobia and visual disturbance.  Respiratory: Negative for choking and stridor.   Gastrointestinal: Negative for vomiting and blood in stool.      Objective:   Physical Exam BP 138/78  Pulse 94  Temp(Src) 98.5 F (36.9 C) (Oral)  Resp 18  Wt 254 lb 2 oz (115.27 kg)  SpO2 83% Physical Exam  VS noted Constitutional: Pt appears well-developed and well-nourished.  HENT: Head: Normocephalic.  Right Ear: External ear normal.  Left Ear: External ear normal.  Eyes: Conjunctivae and EOM are normal. Pupils are equal, round, and reactive to light.  Neck: Normal range of motion. Neck supple.  Cardiovascular: Normal rate and regular rhythm.   Pulmonary/Chest: Effort normal and breath sounds normal.  Abd:  Soft, NT, non-distended, + BS Neurological: Pt is alert. No cranial nerve deficit.  Skin: Skin is warm. No erythema. 1+ edema to knees bilat Psychiatric: Pt behavior is normal. Thought content normal.         Assessment & Plan:

## 2011-01-28 NOTE — Patient Instructions (Addendum)
Please increase the lantus to 70 units per day Please add the new thyroid pill at 25 mcg in addition to what you already take OK to continue the lasix at 40 mg per day All of your new prescriptions were sent locally to the [facility], as well as given hardcopy Please keep your appt at the [place] as planned July 11 If no blood work done there, please return in 4 wks for repeat blood work You will be contacted regarding the referral for: urology

## 2011-01-28 NOTE — Assessment & Plan Note (Signed)
With hesitancy and mild retention symtpoms despite good compliacne with finasteride and flomax;  For urology referral locally

## 2011-01-29 ENCOUNTER — Encounter: Payer: Self-pay | Admitting: Internal Medicine

## 2011-02-16 ENCOUNTER — Encounter (HOSPITAL_BASED_OUTPATIENT_CLINIC_OR_DEPARTMENT_OTHER): Payer: Self-pay | Admitting: *Deleted

## 2011-02-16 ENCOUNTER — Inpatient Hospital Stay (HOSPITAL_COMMUNITY)
Admit: 2011-02-16 | Discharge: 2011-02-20 | DRG: 291 | Disposition: A | Discharge status: Home / Self Care | Payer: Medicare Other | Source: Other Acute Inpatient Hospital | Attending: Internal Medicine | Admitting: Internal Medicine

## 2011-02-16 ENCOUNTER — Emergency Department (HOSPITAL_BASED_OUTPATIENT_CLINIC_OR_DEPARTMENT_OTHER)
Admission: EM | Admit: 2011-02-16 | Discharge: 2011-02-16 | Disposition: A | Discharge status: Nursing Home (Includes ICF) | Payer: Medicare Other | Source: Home / Self Care | Attending: Emergency Medicine | Admitting: Emergency Medicine

## 2011-02-16 ENCOUNTER — Emergency Department (INDEPENDENT_AMBULATORY_CARE_PROVIDER_SITE_OTHER): Payer: Medicare Other

## 2011-02-16 ENCOUNTER — Other Ambulatory Visit: Payer: Self-pay

## 2011-02-16 DIAGNOSIS — N400 Enlarged prostate without lower urinary tract symptoms: Secondary | ICD-10-CM | POA: Diagnosis present

## 2011-02-16 DIAGNOSIS — J189 Pneumonia, unspecified organism: Secondary | ICD-10-CM | POA: Diagnosis present

## 2011-02-16 DIAGNOSIS — F341 Dysthymic disorder: Secondary | ICD-10-CM | POA: Diagnosis present

## 2011-02-16 DIAGNOSIS — IMO0001 Reserved for inherently not codable concepts without codable children: Secondary | ICD-10-CM

## 2011-02-16 DIAGNOSIS — J8410 Pulmonary fibrosis, unspecified: Secondary | ICD-10-CM

## 2011-02-16 DIAGNOSIS — R0602 Shortness of breath: Secondary | ICD-10-CM

## 2011-02-16 DIAGNOSIS — J441 Chronic obstructive pulmonary disease with (acute) exacerbation: Secondary | ICD-10-CM

## 2011-02-16 DIAGNOSIS — I509 Heart failure, unspecified: Secondary | ICD-10-CM | POA: Diagnosis present

## 2011-02-16 DIAGNOSIS — R0902 Hypoxemia: Secondary | ICD-10-CM

## 2011-02-16 DIAGNOSIS — E785 Hyperlipidemia, unspecified: Secondary | ICD-10-CM | POA: Diagnosis present

## 2011-02-16 DIAGNOSIS — I2510 Atherosclerotic heart disease of native coronary artery without angina pectoris: Secondary | ICD-10-CM | POA: Diagnosis present

## 2011-02-16 DIAGNOSIS — E039 Hypothyroidism, unspecified: Secondary | ICD-10-CM | POA: Diagnosis present

## 2011-02-16 DIAGNOSIS — E871 Hypo-osmolality and hyponatremia: Secondary | ICD-10-CM | POA: Diagnosis present

## 2011-02-16 DIAGNOSIS — I5031 Acute diastolic (congestive) heart failure: Principal | ICD-10-CM | POA: Diagnosis present

## 2011-02-16 DIAGNOSIS — Z9981 Dependence on supplemental oxygen: Secondary | ICD-10-CM

## 2011-02-16 DIAGNOSIS — J9620 Acute and chronic respiratory failure, unspecified whether with hypoxia or hypercapnia: Secondary | ICD-10-CM | POA: Diagnosis present

## 2011-02-16 LAB — TROPONIN I: Troponin I: <0.3 ng/mL (ref <0.30)

## 2011-02-16 LAB — URINALYSIS, ROUTINE W REFLEX MICROSCOPIC
Bilirubin Urine: NEGATIVE
Hgb urine dipstick: NEGATIVE
Nitrite: NEGATIVE
Protein, ur: NEGATIVE mg/dL
Urobilinogen, UA: 1 mg/dL (ref 0.0–1.0)

## 2011-02-16 LAB — CBC
MCH: 27.7 pg (ref 26.0–34.0)
MCHC: 31.8 g/dL (ref 30.0–36.0)
Platelets: 142 10*3/uL — ABNORMAL LOW (ref 150–400)
RDW: 15.9 % — ABNORMAL HIGH (ref 11.5–15.5)

## 2011-02-16 LAB — BASIC METABOLIC PANEL
BUN: 17 mg/dL (ref 6–23)
Chloride: 96 mEq/L (ref 96–112)
GFR calc Af Amer: >60 mL/min (ref >60)
GFR calc non Af Amer: >60 mL/min (ref >60)
Potassium: 4.2 mEq/L (ref 3.5–5.1)

## 2011-02-16 LAB — DIFFERENTIAL
Basophils Absolute: 0 10*3/uL (ref 0.0–0.1)
Basophils Relative: 0 % (ref 0–1)
Eosinophils Absolute: 0.1 10*3/uL (ref 0.0–0.7)
Monocytes Relative: 5 % (ref 3–12)
Neutro Abs: 6.5 10*3/uL (ref 1.7–7.7)
Neutrophils Relative %: 82 % — ABNORMAL HIGH (ref 43–77)

## 2011-02-16 MED ORDER — METHYLPREDNISOLONE SODIUM SUCC 125 MG IJ SOLR
125.0000 mg | Freq: Once | INTRAMUSCULAR | Status: AC
  Administered 2011-02-16: 125 mg via INTRAVENOUS
  Filled 2011-02-16: qty 2

## 2011-02-16 MED ORDER — SODIUM CHLORIDE 0.9 % IV SOLN
Freq: Once | INTRAVENOUS | Status: AC
  Administered 2011-02-16: 19:00:00 via INTRAVENOUS

## 2011-02-16 MED ORDER — ALBUTEROL SULFATE (2.5 MG/3ML) 0.083% IN NEBU
5.0000 mg | INHALATION_SOLUTION | Freq: Once | RESPIRATORY_TRACT | Status: AC
  Administered 2011-02-16: 5 mg via RESPIRATORY_TRACT
  Filled 2011-02-16: qty 1

## 2011-02-16 MED ORDER — IPRATROPIUM BROMIDE 0.02 % IN SOLN
0.5000 mg | Freq: Once | RESPIRATORY_TRACT | Status: AC
  Administered 2011-02-16: 0.5 mg via RESPIRATORY_TRACT
  Filled 2011-02-16: qty 2.5

## 2011-02-16 MED ORDER — FUROSEMIDE 10 MG/ML IJ SOLN
40.0000 mg | Freq: Once | INTRAMUSCULAR | Status: AC
  Administered 2011-02-16: 40 mg via INTRAVENOUS
  Filled 2011-02-16: qty 4

## 2011-02-16 MED ORDER — ALBUTEROL SULFATE (5 MG/ML) 0.5% IN NEBU
5.0000 mg | INHALATION_SOLUTION | Freq: Once | RESPIRATORY_TRACT | Status: AC
  Administered 2011-02-16: 5 mg via RESPIRATORY_TRACT
  Filled 2011-02-16: qty 1

## 2011-02-16 NOTE — ED Notes (Signed)
Patient is resting comfortably, seated on side of bed; wife at bedside. Pt. Requesting food.

## 2011-02-16 NOTE — ED Notes (Signed)
Pt states he saw his PCP and has been on Lasix for 1 week. Has noticed that his legs continue to swell and he has been increasingly SHOB. Presents with exertional dyspnea and is also SHOB at rest. Dr. Golda Acre advised and in to evaluate.

## 2011-02-16 NOTE — ED Provider Notes (Addendum)
History     Chief Complaint  Patient presents with  . Respiratory Distress   HPI Comments: Patient presented to the emergency department for increasing bilateral lower extremity edema over the last week. Patient has intermittently been taking his Lasix at home. He states that he is prescribed this medicine to be taken when he feels he needs it. Patient has also had increased shortness of breath at home over the last few days. He is on home oxygen at 2-3 L for COPD. Today he has had increased his oxygen to 4 L. Patient denies fevers cough or chest pain. He denies a history of congestive heart failure. His shortness of breath is worsened with exertion.  The history is provided by the patient and the spouse. No language interpreter was used.    Past Medical History  Diagnosis Date  . THRUSH 11/06/2009  . HYPOTHYROIDISM 07/30/2009  . DIABETES MELLITUS, TYPE II 11/23/2009  . DEPRESSION 11/23/2009  . 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 HYPERTENSION 07/30/2009  . CORONARY ARTERY DISEASE 11/23/2009  . CHRONIC OBSTRUCTIVE PULMONARY DISEASE, ACUTE EXACERBATION 03/01/2010  . C O P D 07/30/2009  . 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 RESPIRATORY FAILURE, CHRONIC 07/31/2009  . BENIGN PROSTATIC HYPERTROPHY 11/23/2009  . DEGENERATIVE JOINT DISEASE 11/23/2009  . 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 FATIGUE 11/23/2009  . 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 GAIT DISTURBANCE 12/10/2009  . DYSPNEA/SHORTNESS OF BREATH 12/08/2009  . HEMOPTYSIS UNSPECIFIED 05/07/2010    Past Surgical History  Procedure Date  . Small bowel obstruction repair with adhesiolysis   . Hx of remote ileum resection due to bleeding   . Abdominal aortic aneurysm repair   . S/p back surgury  1972    lumbar laminectomy Dr. Fannie Knee  . S/p left femoral embolectomy with left leg ischemia     Dr. Hart Rochester, vascular  . S/p right ganglion cyst     Dr. Teressa Senter    History reviewed. No pertinent family history.  History  Substance Use Topics  . Smoking status:  Former Games developer  . Smokeless tobacco: Not on file   Comment: quit in 2000 x 40 yrs. 2.5 ppd  . Alcohol Use: No      Review of Systems  Constitutional: Positive for fatigue. Negative for fever and chills.  HENT: Negative.   Eyes: Negative.  Negative for discharge and redness.  Respiratory: Positive for shortness of breath. Negative for cough.   Cardiovascular: Positive for leg swelling. Negative for chest pain.  Gastrointestinal: Negative.  Negative for nausea, vomiting and abdominal pain.  Genitourinary: Negative.  Negative for hematuria.  Musculoskeletal: Negative for back pain.  Skin: Negative.  Negative for color change and rash.  Neurological: Negative for syncope and headaches.  Hematological: Negative.  Negative for adenopathy.  Psychiatric/Behavioral: Negative.  Negative for confusion.  All other systems reviewed and are negative.    Physical Exam  BP 158/83  Pulse 88  Temp(Src) 98.5 F (36.9 C) (Oral)  Resp 22  Ht 6\' 3"  (1.905 m)  Wt 245 lb (111.131 kg)  BMI 30.62 kg/m2  SpO2 94%  Physical Exam  Constitutional: He is oriented to person, place, and time. He appears well-developed and well-nourished.  Non-toxic appearance. He does not have a sickly appearance.  HENT:  Head: Normocephalic and atraumatic.  Eyes: Conjunctivae, EOM and lids are normal. Pupils are equal, round, and reactive to light.  Neck: Trachea normal, normal range of motion and full passive range of motion without pain. Neck supple.  Cardiovascular: Regular  rhythm and normal heart sounds.   Pulmonary/Chest: Accessory muscle usage present. No respiratory distress. He has decreased breath sounds. He has wheezes. He has no rhonchi. He has rales in the right lower field and the left lower field.       Patient does have faint inspiratory and expiratory wheezing on initial exam. He also has bilateral lower rales. While patient is able to speak in full sentences he does appear to be working hard to breathe  while just sitting on the bed.  Abdominal: Soft. Normal appearance. He exhibits no distension. There is no tenderness. There is no rebound and no CVA tenderness.  Musculoskeletal: Normal range of motion.       Patient has bilateral lower extremity edema that is pitting up to his knees.  Neurological: He is alert and oriented to person, place, and time. He has normal strength.  Skin: Skin is warm, dry and intact. No rash noted.  Psychiatric: He has a normal mood and affect. His speech is normal and behavior is normal. Judgment and thought content normal. Cognition and memory are normal.    ED Course  Procedures Date: 02/16/2011  Rate: 88  Rhythm: normal sinus rhythm  QRS Axis: left  Intervals: Nonspecific intraventricular delay  ST/T Wave abnormalities: normal  Conduction Disutrbances:none  Narrative Interpretation: Left ventricular hypertrophy is present  Old EKG Reviewed: unchanged from prior EKG dated 12/17/2009   MDM Patient does appear to have a component of both a COPD exacerbation and potential CHF exacerbation at this time. He has no changes on his EKG. And no chest pain. I will check cardiac markers to rule out MI as cause for worsening CHF. Patient will receive Lasix for diuresis. He'll also receive albuterol and Atrovent to help with his COPD-type symptoms. I will reassess the patient to determine if he needs further breathing treatments and subsequent steroids. I anticipate the patient is going to need transfer to another facility for admission given that he has had to increase his normal amount of oxygen he is on at home.   Patient has been reassessed and is still showing decreased breath sounds bilaterally. I believe that he has a larger component of COPD exacerbation at this time. I'm going to give him a dose of the site Medrol and continued albuterol treatments here. Patient is specifically requesting to be transferred to [facility] long and not to [facility] at this time. His  doctor is Dr. Jonny Ruiz who would admit to the triad hospitalist so I will contact them. Patient's O2 sats at this time are noted to be 90% on 4 L at rest so significantly lower than he thought to be given his baseline oxygen requirement is 2-3 L.  Patient to be transferred over to [facility] long triad hospitalist service team #4. Patient was accepted by Dr. Shearon Stalls.    Nat Christen, MD 02/16/11 1931  Nat Christen, MD 02/16/11 2002  Nat Christen, MD 02/16/11 2019

## 2011-02-17 LAB — COMPREHENSIVE METABOLIC PANEL
BUN: 22 mg/dL (ref 6–23)
CO2: 33 mEq/L — ABNORMAL HIGH (ref 19–32)
Calcium: 9.9 mg/dL (ref 8.4–10.5)
Creatinine, Ser: 1.32 mg/dL (ref 0.50–1.35)
GFR calc Af Amer: >60 mL/min (ref >60)
GFR calc non Af Amer: 54 mL/min — ABNORMAL LOW (ref >60)
Glucose, Bld: 486 mg/dL — ABNORMAL HIGH (ref 70–99)

## 2011-02-17 LAB — CARDIAC PANEL(CRET KIN+CKTOT+MB+TROPI)
CK, MB: 18 ng/mL (ref 0.3–4.0)
Total CK: 202 U/L (ref 7–232)
Troponin I: <0.3 ng/mL (ref <0.30)
Troponin I: <0.3 ng/mL (ref <0.30)

## 2011-02-17 LAB — GLUCOSE, CAPILLARY
Glucose-Capillary: 310 mg/dL — ABNORMAL HIGH (ref 70–99)
Glucose-Capillary: 466 mg/dL — ABNORMAL HIGH (ref 70–99)
Glucose-Capillary: 299 mg/dL — ABNORMAL HIGH (ref 70–99)

## 2011-02-17 LAB — HEMOGLOBIN A1C: Hgb A1c MFr Bld: 8.5 % — ABNORMAL HIGH (ref <5.7)

## 2011-02-17 LAB — CBC
MCH: 26.8 pg (ref 26.0–34.0)
MCHC: 30.1 g/dL (ref 30.0–36.0)
Platelets: 136 10*3/uL — ABNORMAL LOW (ref 150–400)

## 2011-02-17 LAB — DIFFERENTIAL
Basophils Absolute: 0 10*3/uL (ref 0.0–0.1)
Basophils Relative: 0 % (ref 0–1)
Eosinophils Absolute: 0 10*3/uL (ref 0.0–0.7)
Monocytes Absolute: 0 10*3/uL — ABNORMAL LOW (ref 0.1–1.0)
Monocytes Relative: 0 % — ABNORMAL LOW (ref 3–12)
Neutrophils Relative %: 96 % — ABNORMAL HIGH (ref 43–77)

## 2011-02-17 LAB — PRO B NATRIURETIC PEPTIDE: Pro B Natriuretic peptide (BNP): 58.7 pg/mL (ref 0–125)

## 2011-02-17 LAB — MAGNESIUM: Magnesium: 1.4 mg/dL — ABNORMAL LOW (ref 1.5–2.5)

## 2011-02-17 NOTE — H&P (Signed)
[name], RAZ            ACCOUNT NO.:  [id number]  MEDICAL RECORD NO.:  [id number]  LOCATION:  1344                         FACILITY:  [facility]  PHYSICIAN:  Talmage Nap, MD  DATE OF BIRTH:  [birth year]  DATE OF ADMISSION:  02/16/2011 DATE OF DISCHARGE:                             HISTORY & PHYSICAL   PRIMARY CARE PHYSICIAN:  Dr. Jonny Ruiz.  History obtainable from patient.  CHIEF COMPLAINT:  Cough and shortness of breath of 1 day's duration and swelling of the lower extremities of about 4 to 5 days' duration.  HISTORY OF PRESENT ILLNESS:  The patient is a very pleasant 70 year old Caucasian male with history of COPD, pulmonary fibrosis, chronic hypoxemia on home O2, also has had coronary artery disease with other medical problems. Initially presented [facility] with cough and shortness of breath of about 1 day's duration and this was said to be getting progressively worse.  The cough was said to be productive of yellow sputum.  No associated pleuritic chest pain.  The patient denied any history of fever.  He denied any chills.  He denied any rigors. At the same time, he complained about swelling of the lower extremities that has been getting progressively worse.  He denied any history of vomiting but claimed he was nauseated.  He also gave a history of PND but denied any orthopnea.  He denied any chest pain.  No abdominal discomfort.  No diarrhea or hematochezia, but the symptoms were said to be getting progressively worse.  Hence, the patient presented to [facility].  After the patient was evaluated at [facility], he was subsequently transferred to [facility] for further evaluation and stabilization.  PAST MEDICAL HISTORY: 1. Positive for COPD. 2. Pulmonary fibrosis. 3. Chronic hypoxemia, on home O2. 4. Hypothyroidism. 5. Diabetes mellitus. 6. Depression. 7. Hearing impairment. 8. Hypertension. 9. Coronary artery  disease. 10.BPH. 11.Degenerative joint disease. 12.Lumbar radiculopathy. 13.Chronic fatigue. 14.Tremor. 15.Peripheral vascular disease. 16.Restless leg syndrome. 17.History of DVT.  PAST SURGICAL HISTORY: 1. History of DVT, status post left femoral embolectomy. 2. Lower back surgery. 3. Right ganglion cyst excision. 4. Exploratory laparotomy with adhesiolysis, secondary to small bowel     obstruction. 5. Abdominal aortic aneurysm repair. 6. Questionable ileal resection secondary to bleeding.  PREADMISSION MEDICATIONS: 1. Albuterol (ProAir HF mcg/act) 2 puffs p.r.n. 2. Aspirin 81 mg p.o. daily. 3. Cholecalciferol (Vitamin D3) 2000 units 1 tablet p.o. daily. 4. Depo-Testerone 20 mg per meal IM q.2.weekly. 5. Doxepin (Sinequan) 10 mg twice a day. 6. Doxepin (Sinequan) 15 mg 1 p.o. at bedtime. 7. Finasteride (Proscar) 5 mg 1 p.o. daily. 8. Foradil Aerolizer 12 mcg12 mcg inhale daily. 9. Furosemide (Lasix) 40 mg 1 p.o. daily. 10.Gabapentin (Neurontin) 800 mg p.o. b.i.d. 11.Hydrochlorothiazide 25 mg half a tablet p.o. daily. 12.NovoLog insulin 10 to 20 units 3 times daily before meal and     sliding scale 12 units in the morning, 10 units at lunch, and 20     units at dinnertime. 13.Lantus insulin 70 units subcutaneously at bedtime. 14.Levothyroxine 25 mcg 1 p.o.  daily. 15.Levothyroxine 200 mcg 1 p.o. q.a.m. and 1 p.o. at bedtime. 16.Cozaar 100 mg half a tablet p.o.  daily. 17.Metformin 1000 mg p.o. b.i.d. 18.Mometasone (Asmanex) 50 metered doses 220 mcg 2 puffs daily. 19.Multivitamin (Mega Multivitamin for Men) 1 p.o. daily. 20.Naphazoline (Clear Eyes) 0.012% ophthalmic 1 drop both eyes p.r.n. 21.Niacin (slow-niacin) 500 mg 1 p.o. daily and bedtime. 22.Plaquenil 200 mg 1 p.o. daily. 23.Ropinirole (Requip) 1 mg p.o. daily at bedtime. 24.Simvastatin (Zocor) 20 mg to take 5 mg p.o. at bedtime. 25.Tamsulosin HCL (Flomax) 0.4 mg 1 p.o. daily at bedtime. 26.Tiotropium (Spiriva)  hand inhaler 18 mcg inhale daily. 27.Zolpidem (Ambien) 5 mg p.o. daily.  ALLERGIES:  He has no known allergies.  SOCIAL HISTORY:  Negative for alcohol or tobacco use.  FAMILY HISTORY:  Positive for hypertension and coronary artery disease.  REVIEW OF SYSTEMS:  The patient denies any history of headache.  No blurry vision.  Complaint of shortness of breath with cough that is productive of yellow sputum.  He denies any associated pleuritic chest pain.  He gives a history of PND.  Denies any chills.  No rigor.  No abdominal discomfort.  No diarrhea or hematochezia.  No dysuria or hematuria.  He has swelling of the lower extremities.  No intolerance to heat or cold.  No neuropsychiatric episodes.  PHYSICAL EXAMINATION:  GENERAL:  Very pleasant man, well hydrated, on O2 via nasal cannula. VITAL SIGNS:  Temperature is 98.5, pulse is 107, respiratory rate 22, blood pressure is 149/69, saturating 90% on O2 via nasal cannula. HEENT:  Pupils are reactive to light.  Extraocular movements are intact. NECK:  No jugular venous distension.  No carotid bruits.  No lymphadenopathy. CHEST:  Rales in mid and lower zones of the right lung. HEART:  Sounds are 1 and 2.  Tachycardic. ABDOMEN:  Soft and nontender.  Liver, spleen, and kidney not palpable. Bowel sounds are positive. EXTREMITIES:  +2 pedal edema. NEUROLOGIC:  Nonfocal. MUSCULOSKELETAL: Arthritic changes in the knees. NEUROPSYCHIATRIC:  Unremarkable. SKIN:  Normal turgor.  LABORATORY DATA:  Initial chemistry showed sodium of 140, potassium of 4.2, chloride of 90, bicarb of 35, BUN 17, creatinine 0.90, glucose is 183.  First set of cardiac markers, troponin I is less than 0.30. Hematology currently showed WBC of 7.9, hemoglobin 20.5, hematocrit 42.5, MCV of 87.3 with platelet count of 142,000, neutrophils is 62% elevated and absolute neutrophil count is 6.5.  Blood sugar level is 183.  Urinalysis is unremarkable.  Chest x-ray showed  chronic interstitial changes with questionable infiltrate right lung field.  IMPRESSION: 1. Right lower lobe pneumonia. 2. Chronic obstructive pulmonary disease. 3. Questionable cor pulmonale. 4. Chronic hypoxemia, on home O2. 5. Hypothyroidism. 6. Diabetes mellitus. 7. Coronary artery disease. 8. Pulmonary fibrosis. 9. Hypertension. 10.Benign prostatic hypertrophy. 11.Hearing impaired. 12.Lumbar radiculopathy. 13.History of deep venous thrombosis, status post left femoral     embolectomy. 14.Peripheral vascular disease. 15.Peripheral neuropathy. 16.Questionable restless leg syndrome. 17.Depression.  PLAN:  Plan is to admit the patient to telemetry.  The patient will be continued on O2 via nasal cannula 2-3liters per minute.  He will be started on treatment for pneumonia with Rocephin 1 g IV q.24h. and Zithromax 500 mg IV q.24h.  He will be on Atrovent/Albuterol nebs q.4h.  The patient will also be on Lasix 40 mg IV q12.For the diabetes mellitus, he will be on metformin 1000 mg p.o. b.i.d. and Lantus insulin 70 units subcutaneously at bedtime.  The patient will also be on Accu-Chek t.i.d.  before meals and at bedtime with Regular insulin sliding scale(moderate scale).He will be treated for hypothyroidism with Synthroid.  Other medications to be given to the patient will include: Plaquenil 200 mg p.o. daily, Requip 1 mg p.o. at bedtime, Zocor 10 mg p.o. daily.  He will also be on Flomax 0.4 mg p.o. daily for BPH, Ambien 5 mg p.o. at bedtime, and gabapentin 800 mg p.o. b.i.d.for peripheral neuropathy.  GI prophylaxis will be with Protonix 40 mg p.o. daily and Lovenox 40 mg subcutaneously for DVT prophylaxis. Further work up to be done on this patient will include EKG stat.  Cardiac enzymes q.6h. x 3, BNP stat, blood culture x 2 before starting IV antibiotics, hemoglobin A1c, and 2D echo, CBC, CMP, magnesium, and glucose in a.m.  The patient will be followed and evaluated on  day-to-day basis.     Talmage Nap, MD     CN/MEDQ  D:  02/17/2011  T:  02/17/2011  Job:  [id number]  Electronically Signed by Talmage Nap  on 02/17/2011 04:02:35 AM

## 2011-02-18 LAB — BASIC METABOLIC PANEL
Calcium: 9.4 mg/dL (ref 8.4–10.5)
GFR calc non Af Amer: 57 mL/min — ABNORMAL LOW (ref >60)
Glucose, Bld: 287 mg/dL — ABNORMAL HIGH (ref 70–99)
Sodium: 134 mEq/L — ABNORMAL LOW (ref 135–145)

## 2011-02-18 LAB — LIPID PANEL
LDL Cholesterol: 97 mg/dL (ref 0–99)
Triglycerides: 107 mg/dL (ref <150)
VLDL: 21 mg/dL (ref 0–40)

## 2011-02-18 LAB — GLUCOSE, CAPILLARY
Glucose-Capillary: 243 mg/dL — ABNORMAL HIGH (ref 70–99)
Glucose-Capillary: 202 mg/dL — ABNORMAL HIGH (ref 70–99)
Glucose-Capillary: 261 mg/dL — ABNORMAL HIGH (ref 70–99)

## 2011-02-18 LAB — CBC
MCH: 26.8 pg (ref 26.0–34.0)
MCHC: 30.4 g/dL (ref 30.0–36.0)
Platelets: 141 10*3/uL — ABNORMAL LOW (ref 150–400)

## 2011-02-19 LAB — BASIC METABOLIC PANEL
BUN: 30 mg/dL — ABNORMAL HIGH (ref 6–23)
CO2: 32 mEq/L (ref 19–32)
Calcium: 9.3 mg/dL (ref 8.4–10.5)
Creatinine, Ser: 1.07 mg/dL (ref 0.50–1.35)
Glucose, Bld: 321 mg/dL — ABNORMAL HIGH (ref 70–99)

## 2011-02-19 LAB — GLUCOSE, CAPILLARY: Glucose-Capillary: 114 mg/dL — ABNORMAL HIGH (ref 70–99)

## 2011-02-20 LAB — GLUCOSE, CAPILLARY
Glucose-Capillary: 247 mg/dL — ABNORMAL HIGH (ref 70–99)
Glucose-Capillary: 270 mg/dL — ABNORMAL HIGH (ref 70–99)

## 2011-02-21 ENCOUNTER — Encounter: Payer: Self-pay | Admitting: Internal Medicine

## 2011-02-21 ENCOUNTER — Ambulatory Visit (INDEPENDENT_AMBULATORY_CARE_PROVIDER_SITE_OTHER): Payer: Medicare Other | Admitting: Internal Medicine

## 2011-02-21 VITALS — BP 124/52 | HR 63 | Temp 97.8°F | Ht 75.0 in | Wt 255.8 lb

## 2011-02-21 DIAGNOSIS — J449 Chronic obstructive pulmonary disease, unspecified: Secondary | ICD-10-CM

## 2011-02-21 DIAGNOSIS — G4700 Insomnia, unspecified: Secondary | ICD-10-CM

## 2011-02-21 DIAGNOSIS — J9620 Acute and chronic respiratory failure, unspecified whether with hypoxia or hypercapnia: Secondary | ICD-10-CM | POA: Insufficient documentation

## 2011-02-21 NOTE — Patient Instructions (Signed)
#  insomnia  - please stop benadryl  - avoid ambien due to history of falls (you are already doing this)  -try MELATONIN 3 mg every night before sleep - when you get up in morning get some sunlight (natural) by going for walk or sitting in patio or by windowfor 10-30 minutes #Heart failure and COPD attack - glad you are better - tiredness is probably due to benadryl and just being ill recently - continue medications as advised #Followup - 3 months or sooner if needed - talk to Dr Jonny Ruiz about seeing a cardiologist

## 2011-02-21 NOTE — Assessment & Plan Note (Signed)
This was due to diastolic chf and copd exacerbations. Currently improved but fatigue due to admission vs benadryl is the issue.  PLAN - glad you are better - tiredness is probably due to benadryl and just being ill recently - continue medications as advised #Followup - 3 months or sooner if needed - talk to Dr Jonny Ruiz about seeing a cardiologist

## 2011-02-21 NOTE — Progress Notes (Signed)
Subjective:    Patient ID: [name], male    DOB: Dec 22, 1940, 70 y.o.   MRN: [id number]  HPI History of Present Illness: 66  yowm quit smoking around 2000 with GOLD III COPD by PFT's 09/2009. Meds through [facility]  June 18, 2010: EX 100 pack year smoker. Home O2 dependent Gold stage 3 COPD with low DLCO 35%  in PFTs MArch 2011. BAeline pulse ox is 87-93% on 2L [place]. In,  OCt 2011 Ct showing some UIP component but predmoninant COPD on CT. 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 Sherene Sires to myself.  In Oct 2011 had some mild AECOPD related hemoptysis with CT showing only UIP but not showing any mass. Since then hemoptysis resolved. Currently feels baseline on 2-3L [place]. Claudication lmits him more than dyspnea at 1/4 th mile. Denies chronic cough but on mucinex and feels it helps. On spiriva, foradil and asmanex through [facility]. Has not done rehab though wife is attending rehab. Denies chest pains, edema, wheezing, fever, colored sputum. REC: CHECK AUTOIMMUNE PROFILE,  START NAC, GET FULL PFTs   August 12, 2010: Followup. in interim was admitted for sBP12/19/2011 due to [facility] from prior laparotomies. Resolved with conservative Rx. Started pulmnary rehab 2 weeks ago. Attend 4 sesssions. It is helping so far and he likes it. No new complaints. Dyspnea is stable class 2-3. Denies associated cough, chest pain, hemoptysis, edema, sputum, weight loss. Had autoimmune profile 07/03/2010 - CRP 10, RF 352 but otherwise negative. Upon revealing labs he states he is known to have RA diagnosed over 10 years ago and was on enbrel. He is due to see a rheumatologist at [place], [place]. PFTs today continue to show Gold stage 3 COPD and somewhat improved from a year ago.  August 27, 2010 --Presents for an acute office visit.Complains of increased sob,  dry cough, wheezing. Pt states he doesn't feel like he is getting enough oxygen -cant get  enough air. Retaining more fluid. Denies chest pain, orthopnea, hemoptysis, fever, n/v/d,  headache,recent travel. RX: DOXY AND PRED   OV 02/21/2011: Admitted 7/22 through 7/26 for Acute-on-chronic hypoxemia, probably with chronic obstructive       pulmonary disease exacerbation, pneumonia, acute congestive heart      Failure, (presented with dyspnea, edema, cough). est x-ray on July 22 yield chronic lung changes without acute      overlying pulmonary process.  A 2-D echo yields an ejection fraction of 55% to 60% with grade 2      diastolic dysfunction.  Mitral valve with moderately calcified      Annulus. ( I personally reviewed thos results)  Moderately thickened leaflets. RX: DIURESIS, PRED, AVELOX and O2   Today  Followup from above hospitalization. States he no longer has worsening dyspnea, edema, sputum, cough, fever, chills. Back to baseline o2 need of 3L [place]. However, feels fatigued today more than yesterday. Feels is due to benadryl which he is taking for insomnia. Was talking Remus Loffler but he was falling as a result and stopped. He has class 3 dyspnea at baseline. Has completed rehab.  Compliant with meds  Past Medical History  Diagnosis Date  . THRUSH 11/06/2009  . HYPOTHYROIDISM 07/30/2009  . DIABETES MELLITUS, TYPE II 11/23/2009  . DEPRESSION 11/23/2009  . 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 HYPERTENSION 07/30/2009  . CORONARY ARTERY DISEASE 11/23/2009  . CHRONIC OBSTRUCTIVE PULMONARY DISEASE, ACUTE EXACERBATION 03/01/2010  . C O P D 07/30/2009  .  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 RESPIRATORY FAILURE, CHRONIC 07/31/2009  . BENIGN PROSTATIC HYPERTROPHY 11/23/2009  . DEGENERATIVE JOINT DISEASE 11/23/2009  . 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 FATIGUE 11/23/2009  . 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 GAIT DISTURBANCE 12/10/2009  . DYSPNEA/SHORTNESS OF BREATH 12/08/2009  . HEMOPTYSIS UNSPECIFIED 05/07/2010     No family history on file.   History   Social History  . Marital Status: Married    Spouse  Name: N/A    Number of Children: N/A  . Years of Education: N/A   Occupational History  . disabled veteran, Ex Human resources officer    Social History Main Topics  . Smoking status: Former Smoker -- 2.5 packs/day for 40 years    Types: Cigarettes, Pipe, Cigars    Quit date: 07/28/1998  . Smokeless tobacco: Never Used   Comment: quit in 2000 x 40 yrs. 2.5 ppd  . Alcohol Use: No  . Drug Use: No  . Sexually Active: Not on file   Other Topics Concern  . Not on file   Social History Narrative   Lives in the home with [facility]Se[facility].Denies asbestos exposure     No Known Allergies   Outpatient Prescriptions Prior to Visit  Medication Sig Dispense Refill  . albuterol (PROAIR HFA) 108 (90 BASE) MCG/ACT inhaler Inhale 2 puffs into the lungs as needed. Shortness of breath      . aspirin 81 MG tablet Take 81 mg by mouth daily.        . B-D INS SYR ULTRAFINE 1CC/30G 30G X 1/2" 1 ML MISC       . Cholecalciferol (VITAMIN D3) 2000 UNITS TABS Take 1 tablet by mouth daily.       [name] DEPO-TESTOSTERONE 200 MG/ML injection Inject 200 mg into the muscle every 14 (fourteen) days.      [name] doxepin (SINEQUAN) 10 MG capsule Take 10 mg by mouth 3 (three) times daily.       . finasteride (PROSCAR) 5 MG tablet Take 5 mg by mouth daily.        . formoterol (FORADIL AEROLIZER) 12 MCG capsule for inhaler Inhale contents of 1 capsule two times a day       . furosemide (LASIX) 40 MG tablet Take 1 tablet (40 mg total) by mouth daily.  90 tablet  3  . gabapentin (NEURONTIN) 800 MG tablet Take 800 mg by mouth 2 (two) times daily.        . insulin aspart (NOVOLOG) 100 UNIT/ML injection 10-20 Units 3 (three) times daily before meals. Sliding scale as needed with meals (12 units in the morning 10 units at lunch and 20 units at dinner)      . levothyroxine (LEVOTHROID) 25 MCG tablet Take 1 tablet (25 mcg total) by mouth daily.  90 tablet  3  . levothyroxine (SYNTHROID, LEVOTHROID) 200 MCG tablet 1 tablet  by mouth every morning and every evening       . losartan (COZAAR) 100 MG tablet 1/2 by mouth once daily       . metFORMIN (GLUCOPHAGE) 1000 MG tablet Take 1,000 mg by mouth 2 (two) times daily.        . mometasone (ASMANEX 60 METERED DOSES) 220 MCG/INH inhaler Inhale 2 puffs into the lungs daily.        . Multiple Vitamins-Minerals (MEGA MULTIVITAMIN FOR MEN PO) Take by mouth daily.        . naphazoline (CLEAR EYES) 0.012 % ophthalmic  solution Place 1 drop into both eyes as needed. Red eyes       . niacin (SLO-NIACIN) 500 MG tablet Take 500 mg by mouth at bedtime.        [name] PLAQUENIL 200 MG tablet Take 1 tablet by mouth Daily.      [name] rOPINIRole (REQUIP) 1 MG tablet Take 1 mg by mouth at bedtime.        . Tamsulosin HCl (FLOMAX) 0.4 MG CAPS Take by mouth at bedtime.        [name] tiotropium (SPIRIVA HANDIHALER) 18 MCG inhalation capsule Place 18 mcg into inhaler and inhale daily.        . insulin glargine (LANTUS) 100 UNIT/ML injection Inject 70 Units into the skin at bedtime.  10 mL  5  . doxepin (SINEQUAN) 50 MG capsule Take 50 mg by mouth at bedtime.        . simvastatin (ZOCOR) 20 MG tablet Take 5 mg by mouth at bedtime.       [name] zolpidem (AMBIEN) 5 MG tablet Take 5 mg by mouth at bedtime.        . hydrochlorothiazide 25 MG tablet 1/2 by mouth once daily               Review of Systems  Constitutional: Negative for fever and unexpected weight change.  HENT: Positive for rhinorrhea and sneezing. Negative for ear pain, nosebleeds, congestion, sore throat, trouble swallowing, dental problem, postnasal drip and sinus pressure.   Eyes: Negative for redness and itching.  Respiratory: Negative for cough, chest tightness, shortness of breath and wheezing.   Cardiovascular: Positive for leg swelling. Negative for palpitations.  Gastrointestinal: Negative for nausea and vomiting.  Genitourinary: Negative for dysuria.  Musculoskeletal: Negative for joint swelling.  Skin: Negative for rash.    Neurological: Negative for headaches.  Hematological: Does not bruise/bleed easily.  Psychiatric/Behavioral: Negative for dysphoric mood. The patient is not nervous/anxious.        Objective:   Physical Exam General:  well developed, well nourished, in no acute distressobese.   Head:  normocephalic and atraumatic Eyes:  PERRLA/EOM intact; conjunctiva and sclera clear Ears:  TMs intact and clear with normal canals Nose:  no deformity, discharge, inflammation, or lesions Mouth:  no deformity or lesions Neck:  no masses, thyromegaly, or abnormal cervical nodes Chest Wall:  no deformities noted Lungs:  decreased BS bilateral and coarse velcro crackles at bases  Heart:  regular rate and rhythm, S1, S2 without murmurs, rubs, gallops, or clicks Abdomen:  bowel sounds positive; abdomen soft and non-tender without masses, or organomegaly Msk:  no deformity or scoliosis noted with normal posture Pulses:  pulses normal Extremities:  no clubbing, cyanosis, edema, or deformity noted Neurologic:  CN II-XII grossly intact with normal reflexes, coordination, muscle strength and tone Skin:  intact without lesions or rashes Cervical Nodes:  no significant adenopathy Axillary Nodes:  no significant adenopathy Psych:  alert and cooperative; normal mood and affect; normal attention span and concentration        Assessment & Plan:   No problem-specific assessment & plan notes found for this encounter.

## 2011-02-21 NOTE — Assessment & Plan Note (Signed)
#  insomnia  - please stop benadryl  - avoid ambien due to history of falls (you are already doing this)  -try MELATONIN 3 mg every night before sleep - when you get up in morning get some sunlight (natural) by going for walk or sitting in patio or by windowfor 10-30 minutes

## 2011-02-23 LAB — CULTURE, BLOOD (ROUTINE X 2)
Culture  Setup Time: 201207230920
Culture  Setup Time: 201207230920
Culture: NO GROWTH

## 2011-02-25 ENCOUNTER — Encounter: Payer: Self-pay | Admitting: Internal Medicine

## 2011-02-25 ENCOUNTER — Ambulatory Visit (INDEPENDENT_AMBULATORY_CARE_PROVIDER_SITE_OTHER): Payer: Medicare Other | Admitting: Internal Medicine

## 2011-02-25 VITALS — BP 112/60 | HR 73 | Temp 98.0°F | Ht 74.5 in | Wt 252.8 lb

## 2011-02-25 DIAGNOSIS — I5189 Other ill-defined heart diseases: Secondary | ICD-10-CM

## 2011-02-25 DIAGNOSIS — I10 Essential (primary) hypertension: Secondary | ICD-10-CM

## 2011-02-25 DIAGNOSIS — E119 Type 2 diabetes mellitus without complications: Secondary | ICD-10-CM

## 2011-02-25 DIAGNOSIS — I519 Heart disease, unspecified: Secondary | ICD-10-CM

## 2011-02-25 MED ORDER — FUROSEMIDE 20 MG PO TABS: ORAL_TABLET | ORAL | Status: DC

## 2011-02-25 NOTE — Progress Notes (Signed)
Subjective:    Patient ID: [name], male    DOB: March 23, 1941, 70 y.o.   MRN: [id number]  HPI  Here after recent hospn for COPD exac, PNA, and diast dysfunction July 23 -26 at [facility] with echo done;  Finished antibx - avelox, and has about one more wk lasix; saw Dr Marchelle Gearing July 27 as well, no med changes per pt.  Has one more day prednisone to finish, then have.   Here to f/u; overall doing ok,  Pt denies chest pain, increased sob or doe, wheezing, orthopnea, PND, increased LE swelling, palpitations, dizziness or syncope.  Pt denies new neurological symptoms such as new headache, or facial or extremity weakness or numbness   Pt denies polydipsia, polyuria, or low sugar symptoms such as weakness or confusion improved with po intake.  Pt states overall good compliance with meds, trying to follow lower cholesterol, diabetic diet, wt overall stable but little exercise however. Recent a1c last wk just over 8, down from over 10.  Sees MD at the [facility] for DM, and prefers this. Also see psychologist at the [place]. Does have sense of ongoing fatigue, but denies signficant hypersomnolence, just has "no energy"  Has appt at [facility] urology tomorrow.  Pt inquires about seeing cardiology, was seeing Dr Fraser Din at [place] who is no longer there, and he states he owes money from 70 yo and cant see them further.  Last stress test "a few yrs ago" Past Medical History  Diagnosis Date  . THRUSH 11/06/2009  . HYPOTHYROIDISM 07/30/2009  . DIABETES MELLITUS, TYPE II 11/23/2009  . DEPRESSION 11/23/2009  . 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 HYPERTENSION 07/30/2009  . CORONARY ARTERY DISEASE 11/23/2009  . CHRONIC OBSTRUCTIVE PULMONARY DISEASE, ACUTE EXACERBATION 03/01/2010  . C O P D 07/30/2009  . 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 RESPIRATORY FAILURE, CHRONIC 07/31/2009  . BENIGN PROSTATIC HYPERTROPHY 11/23/2009  . DEGENERATIVE JOINT DISEASE 11/23/2009  . 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 FATIGUE 11/23/2009  . TREMOR  11/23/2009  . GAIT DISTURBANCE 12/10/2009  . DYSPNEA/SHORTNESS OF BREATH 12/08/2009  . HEMOPTYSIS UNSPECIFIED 05/07/2010   Past Surgical History  Procedure Date  . Small bowel obstruction repair with adhesiolysis   . Hx of remote ileum resection due to bleeding   . Abdominal aortic aneurysm repair   . S/p back surgury  1972    lumbar laminectomy Dr. Fannie Knee  . S/p left femoral embolectomy with left leg ischemia     Dr. Hart Rochester, vascular  . S/p right ganglion cyst     Dr. Teressa Senter    reports that he quit smoking about 12 years ago. His smoking use included Cigarettes, Pipe, and Cigars. He has a 100 pack-year smoking history. He has never used smokeless tobacco. He reports that he does not drink alcohol or use illicit drugs. family history is not on file. No Known Allergies Current Outpatient Prescriptions on File Prior to Visit  Medication Sig Dispense Refill  . albuterol (PROAIR HFA) 108 (90 BASE) MCG/ACT inhaler Inhale 2 puffs into the lungs as needed. Shortness of breath      . aspirin 81 MG tablet Take 81 mg by mouth daily.        . B-D INS SYR ULTRAFINE 1CC/30G 30G X 1/2" 1 ML MISC       . Cholecalciferol (VITAMIN D3) 2000 UNITS TABS Take 1 tablet by mouth daily.       [name] DEPO-TESTOSTERONE 200 MG/ML injection Inject 200 mg into the muscle every  14 (fourteen) days.      [name] doxepin (SINEQUAN) 10 MG capsule Take 10 mg by mouth 3 (three) times daily.       [name] doxepin (SINEQUAN) 50 MG capsule Take 50 mg by mouth at bedtime.        . finasteride (PROSCAR) 5 MG tablet Take 5 mg by mouth daily.        . formoterol (FORADIL AEROLIZER) 12 MCG capsule for inhaler Inhale contents of 1 capsule two times a day       . gabapentin (NEURONTIN) 800 MG tablet Take 800 mg by mouth 2 (two) times daily.        . insulin aspart (NOVOLOG) 100 UNIT/ML injection 10-20 Units 3 (three) times daily before meals. Sliding scale as needed with meals (12 units in the morning 10 units at lunch and 20 units at dinner)      .  insulin glargine (LANTUS) 100 UNIT/ML injection Inject 78 Units into the skin at bedtime.        [name] levothyroxine (LEVOTHROID) 25 MCG tablet Take 1 tablet (25 mcg total) by mouth daily.  90 tablet  3  . levothyroxine (SYNTHROID, LEVOTHROID) 200 MCG tablet 1 tablet by mouth every morning and every evening       . losartan (COZAAR) 100 MG tablet 1/2 by mouth once daily       . metFORMIN (GLUCOPHAGE) 1000 MG tablet Take 1,000 mg by mouth 2 (two) times daily.        . metoprolol tartrate (LOPRESSOR) 25 MG tablet Take 1 tablet by mouth Twice daily.      . mometasone (ASMANEX 60 METERED DOSES) 220 MCG/INH inhaler Inhale 2 puffs into the lungs daily.        . Multiple Vitamins-Minerals (MEGA MULTIVITAMIN FOR MEN PO) Take by mouth daily.        . naphazoline (CLEAR EYES) 0.012 % ophthalmic solution Place 1 drop into both eyes as needed. Red eyes       . niacin (SLO-NIACIN) 500 MG tablet Take 500 mg by mouth at bedtime.        [name] PLAQUENIL 200 MG tablet Take 1 tablet by mouth Daily.      [name] rOPINIRole (REQUIP) 1 MG tablet Take 1 mg by mouth at bedtime.        . rosuvastatin (CRESTOR) 10 MG tablet Take 5 mg by mouth daily.        . simvastatin (ZOCOR) 20 MG tablet Take 5 mg by mouth at bedtime.       . Tamsulosin HCl (FLOMAX) 0.4 MG CAPS Take by mouth at bedtime.        [name] tiotropium (SPIRIVA HANDIHALER) 18 MCG inhalation capsule Place 18 mcg into inhaler and inhale daily.        [name] zolpidem (AMBIEN) 5 MG tablet Take 5 mg by mouth at bedtime.         Review of Systems Review of Systems  Constitutional: Negative for diaphoresis and unexpected weight change.  HENT: Negative for drooling and tinnitus.   Eyes: Negative for photophobia and visual disturbance.  Respiratory: Negative for choking and stridor.   Gastrointestinal: Negative for vomiting and blood in stool.  Genitourinary: Negative for hematuria and decreased urine volume.     Objective:   Physical Exam BP 112/60  Pulse 73  Temp(Src) 98 F (36.7  C) (Oral)  Ht 6' 2.5" (1.892 m)  Wt 252 lb 12.8 oz (114.669 kg)  BMI 32.02 kg/m2  SpO2  93% Physical Exam  VS noted Constitutional: Pt appears well-developed and well-nourished.  HENT: Head: Normocephalic.  Right Ear: External ear normal.  Left Ear: External ear normal.  Eyes: Conjunctivae and EOM are normal. Pupils are equal, round, and reactive to light.  Neck: Normal range of motion. Neck supple.  Cardiovascular: Normal rate and regular rhythm.   Pulmonary/Chest: Effort normal and breath sounds dereased bilat.  Abd:  Soft, NT, non-distended, + BS Neurological: Pt is alert. No cranial nerve deficit.  Skin: Skin is warm. No erythema. trace edema bilat LE's Psychiatric: Pt behavior is normal. Thought content normal.1+ nervous        Assessment & Plan:

## 2011-02-25 NOTE — Patient Instructions (Signed)
Continue all other medications as before You will be contacted regarding the referral for: cardiology

## 2011-02-25 NOTE — Assessment & Plan Note (Signed)
Improved overall but still uncontrolled, pt prefers f/u with endo at [facility] for this

## 2011-02-25 NOTE — Assessment & Plan Note (Signed)
stable overall by hx and exam, most recent data reviewed with pt, and pt to continue medical treatment as before (except lasix on emr charting is changed to the 20 mg he is actually taking); refer cards per pt request

## 2011-02-25 NOTE — Assessment & Plan Note (Signed)
stable overall by hx and exam, most recent data reviewed with pt, and pt to continue medical treatment as before  BP Readings from Last 3 Encounters:  02/25/11 112/60  02/21/11 124/52  02/16/11 149/69

## 2011-03-10 ENCOUNTER — Telehealth: Payer: Self-pay

## 2011-03-10 MED ORDER — FUROSEMIDE 40 MG PO TABS: 40.0000 mg | ORAL_TABLET | Freq: Every day | ORAL | Status: DC

## 2011-03-10 NOTE — Telephone Encounter (Signed)
Patient informed. 

## 2011-03-10 NOTE — Telephone Encounter (Signed)
Note from pharmacy stating the patient has been on lasix 20 mg, requesting if the increase to 40 mg will be from now on?

## 2011-03-10 NOTE — Telephone Encounter (Signed)
Ok to increase lasix to 40 qd

## 2011-03-10 NOTE — Telephone Encounter (Signed)
Pt called stating his legs and ankles are swollen, pt is requesting medication adjustment/MD advisement.

## 2011-03-10 NOTE — Telephone Encounter (Signed)
Yes, this is likely permanent

## 2011-03-11 NOTE — Telephone Encounter (Signed)
Pharmacy informed of MD's instructions 

## 2011-03-19 ENCOUNTER — Telehealth: Payer: Self-pay | Admitting: Internal Medicine

## 2011-03-19 NOTE — Telephone Encounter (Signed)
Spoke with pt wife and she states the pt was discharged from hospital on 02-20-11 and was diagnosed with CHF. She MR advised they f/u with PCP to get an appt with cardiology and a referral was placed but appt is not until 03-26-11. She states the pt is having increased SOB and fatigue. She states he stays in bed most of the time, and when she does get him up he cannot walk far without getting very SOB. He is currently on 3 liters of oxygen. She does not have pulse ox to check sats. She is upset that cardiology appt is so far out and nothing is being done.  She does not want to take pt to hospital. She is requesting an appt. I offered an appt today but she states she rather wait until tomorrow because he already has an appt with urology at 9am. So appt set to see TP tomorrow at 10:30. I advised if he gets worse before then to call 911. Wife states understanding. Carron Curie, CMA

## 2011-03-19 NOTE — Telephone Encounter (Signed)
SPOUSE STATES THAT PT C/O SOB X 4 DAYS. ALSO SAYS PT HAS HAD A "GRAYISH" COLOR TO HIS SKIN FOR 4 DAYS AS WELL. I ADVISED HER TO CALL 911. HOWEVER, SHE DOES NOT WANT TO DO THAT. SHE SAYS HE WAS DIAGNOSED WITH CHF WHILE IN HOSPITAL PREVIOUSLY BUT HAS NOT SEEN A CARDIOLOGIST YET. PLEASE CALL HOME # ASAP.

## 2011-03-20 ENCOUNTER — Encounter: Payer: Self-pay | Admitting: Adult Health

## 2011-03-20 ENCOUNTER — Ambulatory Visit (INDEPENDENT_AMBULATORY_CARE_PROVIDER_SITE_OTHER): Payer: Medicare Other | Admitting: Adult Health

## 2011-03-20 ENCOUNTER — Other Ambulatory Visit (INDEPENDENT_AMBULATORY_CARE_PROVIDER_SITE_OTHER): Payer: Medicare Other

## 2011-03-20 VITALS — BP 142/76 | HR 62 | Temp 98.2°F | Ht 75.0 in | Wt 262.0 lb

## 2011-03-20 DIAGNOSIS — J449 Chronic obstructive pulmonary disease, unspecified: Secondary | ICD-10-CM

## 2011-03-20 DIAGNOSIS — I519 Heart disease, unspecified: Secondary | ICD-10-CM

## 2011-03-20 DIAGNOSIS — I5189 Other ill-defined heart diseases: Secondary | ICD-10-CM

## 2011-03-20 DIAGNOSIS — R0602 Shortness of breath: Secondary | ICD-10-CM

## 2011-03-20 LAB — BASIC METABOLIC PANEL
Calcium: 9.4 mg/dL (ref 8.4–10.5)
GFR: 77.72 mL/min (ref >60.00)
Glucose, Bld: 157 mg/dL — ABNORMAL HIGH (ref 70–99)
Potassium: 4 mEq/L (ref 3.5–5.1)
Sodium: 139 mEq/L (ref 135–145)

## 2011-03-20 MED ORDER — SPIRONOLACTONE 25 MG PO TABS: 25.0000 mg | ORAL_TABLET | Freq: Two times a day (BID) | ORAL | Status: DC

## 2011-03-20 NOTE — Patient Instructions (Signed)
Begin Aldactone 25mg  Twice daily   Stop Hydrochlorothiazide (HCTZ).  Low salt diet  Legs elevated and support hose As needed   Please contact office for sooner follow up if symptoms do not improve or worsen or seek emergency care  follow up in 2 weeks with Amiliah Campisi or Dr. Marchelle Gearing

## 2011-03-20 NOTE — Assessment & Plan Note (Addendum)
Mild flare  Check bmet today , and will repeat on return, monitor K+ and scr closely If good response on return may consider decreasing lasix to 20mg  daily  Plan:  Begin Aldactone 25mg  Twice daily   Stop Hydrochlorothiazide (HCTZ).  Low salt diet  Legs elevated and support hose As needed   Please contact office for sooner follow up if symptoms do not improve or worsen or seek emergency care  follow up in 2 weeks with Emmert Roethler or Dr. Marchelle Gearing

## 2011-03-20 NOTE — Progress Notes (Signed)
Subjective:    Patient ID: [name], male    DOB: 1941-06-25, 71 y.o.   MRN: [id number]  HPI History of Present Illness: 32  yowm quit smoking around 2000 with GOLD III COPD by PFT's 09/2009. Meds through [facility]  June 18, 2010: EX 100 pack year smoker. Home O2 dependent Gold stage 3 COPD with low DLCO 35%  in PFTs MArch 2011. BAeline pulse ox is 87-93% on 2L [place]. In,  OCt 2011 Ct showing some UIP component but predmoninant COPD on CT. 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 Sherene Sires to myself.  In Oct 2011 had some mild AECOPD related hemoptysis with CT showing only UIP but not showing any mass. Since then hemoptysis resolved. Currently feels baseline on 2-3L [place]. Claudication lmits him more than dyspnea at 1/4 th mile. Denies chronic cough but on mucinex and feels it helps. On spiriva, foradil and asmanex through [facility]. Has not done rehab though wife is attending rehab. Denies chest pains, edema, wheezing, fever, colored sputum. REC: CHECK AUTOIMMUNE PROFILE,  START NAC, GET FULL PFTs   August 12, 2010: Followup. in interim was admitted for sBP12/19/2011 due to [facility] from prior laparotomies. Resolved with conservative Rx. Started pulmnary rehab 2 weeks ago. Attend 4 sesssions. It is helping so far and he likes it. No new complaints. Dyspnea is stable class 2-3. Denies associated cough, chest pain, hemoptysis, edema, sputum, weight loss. Had autoimmune profile 07/03/2010 - CRP 10, RF 352 but otherwise negative. Upon revealing labs he states he is known to have RA diagnosed over 10 years ago and was on enbrel. He is due to see a rheumatologist at [place], [place]. PFTs today continue to show Gold stage 3 COPD and somewhat improved from a year ago.  August 27, 2010 --Presents for an acute office visit.Complains of increased sob,  dry cough, wheezing. Pt states he doesn't feel like he is getting enough oxygen -cant get  enough air. Retaining more fluid. Denies chest pain, orthopnea, hemoptysis, fever, n/v/d,  headache,recent travel. RX: DOXY AND PRED   OV 02/21/2011: Admitted 7/22 through 7/26 for Acute-on-chronic hypoxemia, probably with chronic obstructive       pulmonary disease exacerbation, pneumonia, acute congestive heart      Failure, (presented with dyspnea, edema, cough). est x-ray on July 22 yield chronic lung changes without acute      overlying pulmonary process.  A 2-D echo yields an ejection fraction of 55% to 60% with grade 2      diastolic dysfunction.  Mitral valve with moderately calcified      Annulus. ( I personally reviewed thos results)  Moderately thickened leaflets. RX: DIURESIS, PRED, AVELOX and O2   Today  Followup from above hospitalization. States he no longer has worsening dyspnea, edema, sputum, cough, fever, chills. Back to baseline o2 need of 3L [place]. However, feels fatigued today more than yesterday. Feels is due to benadryl which he is taking for insomnia. Was talking Remus Loffler but he was falling as a result and stopped. He has class 3 dyspnea at baseline. Has completed rehab.  Compliant with meds>>no changes  03/20/2011 Acute OV  Complains of persistent leg swelling. Increased DOE with actiivity . Wears out easily. Lasix has been increased to 40mg  daily with limitedresponse. Weight trending up , now up 10 lbs over last month. Does have cards consult next week. Previous echo last month during hospital admit showed LV nml w/ grade 2 diastolic  dysfnx. No chest pain , increaesed cough or congestion     Past Medical History  Diagnosis Date  . THRUSH 11/06/2009  . HYPOTHYROIDISM 07/30/2009  . DIABETES MELLITUS, TYPE II 11/23/2009  . DEPRESSION 11/23/2009  . 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 HYPERTENSION 07/30/2009  . CORONARY ARTERY DISEASE 11/23/2009  . CHRONIC OBSTRUCTIVE PULMONARY DISEASE, ACUTE EXACERBATION 03/01/2010  . C O P D 07/30/2009  . 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 RESPIRATORY  FAILURE, CHRONIC 07/31/2009  . BENIGN PROSTATIC HYPERTROPHY 11/23/2009  . DEGENERATIVE JOINT DISEASE 11/23/2009  . 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 FATIGUE 11/23/2009  . 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 GAIT DISTURBANCE 12/10/2009  . DYSPNEA/SHORTNESS OF BREATH 12/08/2009  . HEMOPTYSIS UNSPECIFIED 05/07/2010     No family history on file.   History   Social History  . Marital Status: Married    Spouse Name: N/A    Number of Children: N/A  . Years of Education: N/A   Occupational History  . disabled veteran, Ex Human resources officer    Social History Main Topics  . Smoking status: Former Smoker -- 2.5 packs/day for 40 years    Types: Cigarettes, Pipe, Cigars    Quit date: 07/28/1998  . Smokeless tobacco: Never Used   Comment: quit in 2000 x 40 yrs. 2.5 ppd  . Alcohol Use: No  . Drug Use: No  . Sexually Active: Not on file   Other Topics Concern  . Not on file   Social History Narrative   Lives in the home with [facility].Denies asbestos exposure     No Known Allergies   Outpatient Prescriptions Prior to Visit  Medication Sig Dispense Refill  . albuterol (PROAIR HFA) 108 (90 BASE) MCG/ACT inhaler Inhale 2 puffs into the lungs as needed. Shortness of breath      . aspirin 81 MG tablet Take 81 mg by mouth daily.        . B-D INS SYR ULTRAFINE 1CC/30G 30G X 1/2" 1 ML MISC       . Cholecalciferol (VITAMIN D3) 2000 UNITS TABS Take 1 tablet by mouth daily.       [name] DEPO-TESTOSTERONE 200 MG/ML injection Inject 200 mg into the muscle every 14 (fourteen) days.      [name] doxepin (SINEQUAN) 10 MG capsule Take 10 mg by mouth 3 (three) times daily.       . finasteride (PROSCAR) 5 MG tablet Take 5 mg by mouth daily.        . formoterol (FORADIL AEROLIZER) 12 MCG capsule for inhaler Inhale contents of 1 capsule two times a day       . furosemide (LASIX) 40 MG tablet Take 1 tablet (40 mg total) by mouth daily.  90 tablet  3  . gabapentin (NEURONTIN) 800 MG tablet Take 800 mg by  mouth 2 (two) times daily.        . insulin aspart (NOVOLOG) 100 UNIT/ML injection 10-20 Units 3 (three) times daily before meals. Sliding scale as needed with meals (12 units in the morning 10 units at lunch and 20 units at dinner)      . levothyroxine (LEVOTHROID) 25 MCG tablet Take 1 tablet (25 mcg total) by mouth daily.  90 tablet  3  . levothyroxine (SYNTHROID, LEVOTHROID) 200 MCG tablet 1 tablet by mouth every morning and every evening       . losartan (COZAAR) 100 MG tablet 1/2 by mouth once daily       .  metFORMIN (GLUCOPHAGE) 1000 MG tablet Take 1,000 mg by mouth 2 (two) times daily.        . mometasone (ASMANEX 60 METERED DOSES) 220 MCG/INH inhaler Inhale 2 puffs into the lungs daily.        . Multiple Vitamins-Minerals (MEGA MULTIVITAMIN FOR MEN PO) Take by mouth daily.        . naphazoline (CLEAR EYES) 0.012 % ophthalmic solution Place 1 drop into both eyes as needed. Red eyes       . niacin (SLO-NIACIN) 500 MG tablet Take 500 mg by mouth at bedtime.        [name] PLAQUENIL 200 MG tablet Take 1 tablet by mouth Daily.      [name] rOPINIRole (REQUIP) 1 MG tablet Take 1 mg by mouth at bedtime.        . Tamsulosin HCl (FLOMAX) 0.4 MG CAPS Take by mouth at bedtime.        [name] tiotropium (SPIRIVA HANDIHALER) 18 MCG inhalation capsule Place 18 mcg into inhaler and inhale daily.        . insulin glargine (LANTUS) 100 UNIT/ML injection Inject 70 Units into the skin at bedtime.  10 mL  5  . doxepin (SINEQUAN) 50 MG capsule Take 50 mg by mouth at bedtime.        . simvastatin (ZOCOR) 20 MG tablet Take 5 mg by mouth at bedtime.       [name] zolpidem (AMBIEN) 5 MG tablet Take 5 mg by mouth at bedtime.        . hydrochlorothiazide 25 MG tablet 1/2 by mouth once daily               Review of Systems  Constitutional: Negative for fever and unexpected weight change.  HENT:Negative for ear pain, nosebleeds, congestion, sore throat, trouble swallowing, dental problem, postnasal drip and sinus pressure.   Eyes:  Negative for redness and itching.  Respiratory: Negative for cough, chest tightness, shortness of breath and wheezing.   Cardiovascular: Positive for leg swelling. Negative for palpitations.  Gastrointestinal: Negative for nausea and vomiting.  Genitourinary: Negative for dysuria.  Musculoskeletal: Negative for joint swelling.  Skin: Negative for rash.  Neurological: Negative for headaches.  Hematological: Does not bruise/bleed easily.  Psychiatric/Behavioral: Negative for dysphoric mood. The patient is not nervous/anxious.        Objective:   Physical Exam General:  well developed, well nourished, in no acute distressobese.   Head:  normocephalic and atraumatic Eyes:  PERRLA/EOM intact; conjunctiva and sclera clear Ears:  TMs intact and clear with normal canals Nose:  no deformity, discharge, inflammation, or lesions Mouth:  no deformity or lesions Neck:  no masses, thyromegaly, or abnormal cervical nodes Chest Wall:  no deformities noted Lungs:  decreased BS bilateral and coarse velcro crackles at bases  Heart:  regular rate and rhythm, S1, S2 without murmurs, rubs, gallops, or clicks, 2+ edema  Abdomen:  bowel sounds positive; abdomen soft and non-tender without masses, or organomegaly Msk:  no deformity or scoliosis noted with normal posture Pulses:  pulses normal Extremities:  no clubbing, cyanosis, or deformity noted Neurologic:  CN II-XII grossly intact with normal reflexes, coordination, muscle strength and tone Skin:  intact without lesions or rashes Cervical Nodes:  no significant adenopathy Axillary Nodes:  no significant adenopathy Psych:  alert and cooperative; normal mood and affect; normal attention span and concentration        Assessment & Plan:   No problem-specific assessment & plan notes found  for this encounter.

## 2011-03-20 NOTE — Discharge Summary (Signed)
[name], [name]            ACCOUNT NO.:  [id number]  MEDICAL RECORD NO.:  [id number]  LOCATION:  1407                         FACILITY:  [facility]  PHYSICIAN:  Gwenyth Bender, NP      DATE OF BIRTH:  31-Jul-1940  DATE OF ADMISSION:  02/16/2011 DATE OF DISCHARGE:  02/20/2011                              DISCHARGE SUMMARY   PRIMARY CARE PHYSICIAN:  Corwin Levins, MD  PULMONOLOGIST:  Kalman Shan, MD  DISCHARGE DIAGNOSES: 1. Acute-on-chronic hypoxemia, probably with chronic obstructive     pulmonary disease exacerbation, pneumonia, acute congestive heart     failure, pulmonary fibrosis. 2. Leukocytosis, probably secondary to Solu-Medrol. 3. Diabetes, type 2, uncontrolled. 4. Hypertension. 5. Hyponatremia. 6. Hypothyroidism. 7. Coronary artery disease. 8. Depression/anxiety. 9. Benign prostatic hypertrophy. 10.Hyperlipidemia.  DISCHARGE MEDICATIONS: 1. Avelox 400 mg p.o. daily for 3 more days. 2. Lasix 20 mg p.o. daily.3. Metoprolol 25 mg p.o. b.i.d. 4. Albuterol inhaler 2 puffs every 4-6hours as needed for shortness of     breath. 5. Ambien 5 mg p.o. daily at bedtime as needed for sleep. 6. Artificial tears, 1 drop both eyes daily as needed for dry eyes. 7. Asmanex 2 puffs inhaled daily at bedtime. 8. Aspirin 81 mg p.o. daily. 9. Crestor 10 mg half tablet daily at bedtime. 10.Depo-Testosterone injections 200 mg IM once every 2 weeks. 11.Doxepin 10 mg p.o. t.i.d. 12.Finasteride 5 mg p.o. daily at bedtime. 13.Formoterol fumarate 12 mcg inhaler 1 capsule every 12 hours. 14.Gabapentin 800 mg p.o. b.i.d. 15.Lantus 78 units subcutaneously daily at bedtime. 16.Levothyroxine 200 mcg p.o. daily. 17.Levothyroxine 25 mcg p.o. daily for a total of 225 mcg. 18.Losartan 100 mg half a tablet p.o. daily. 19.Metformin 1000 mg p.o. b.i.d. 20.Multivitamins p.o. daily. 21.NovoLog insulin subcutaneously 12 units in the morning, 10 units at     lunch, 20 units at  dinner. 22.Plaquenil 200 mg p.o. daily at bedtime. 23.Ropinirole 1 mg p.o. daily. 24.Slo-Niacin 500 mg p.o. daily at bedtime. 25.Spiriva 18 mcg 2 capsules inhaled every morning. 26.Tamsulosin 0.4 mg p.o. daily at bedtime. 27.Tylenol extra strength 500 mg 2 tablets p.o. every 6 hours as     needed for pain. 28.Vitamin D3 2000 units over-the-counter 1 tablet p.o. daily.  DIAGNOSTIC LABS:  WBC is 7.9, hemoglobin 13.5, hematocrit 42.5, platelets 142, neutrophils 82%.  Sodium 140, potassium 4.2, chloride 96, CO2 of 35, BUN 17, creatinine 0.90, glucose 183.  Troponin I less than 0.30.  Magnesium 1.4.  Alkaline phosphatase 66, AST 27, ALT 22.  Cardiac panel on July 23 yields a total CK 202, CK-MB 16.5, troponin I less than 0.30, proBNP was 58.7.  Second set of cardiac enzymes yields a total CK of 260 and CK-MB of 18.  Troponin I less than 0.30.  Hemoglobin A1c 8.5. Third set of cardiac enzymes yields a total CK of 280, CK-MB of 17.4, troponin I less than 0.30.  Lipid profile yields cholesterol 155, triglycerides 107, HDL 37, LDL 97.  Blood cultures showed no growth to date.  TSH was 0.292.  DIAGNOSTIC IMAGING: 1. Chest x-ray on July 22 yield chronic lung changes without acute     overlying pulmonary process. 2. A 2-D echo yields  an ejection fraction of 55% to 60% with grade 2     diastolic dysfunction.  Mitral valve with moderately calcified     annulus.  Moderately thickened leaflets.  CONSULTATIONS:  None.  PROCEDURES:  None.  BRIEF HISTORY:  [name] is a very pleasant 70 year old male with a history of COPD, pulmonary fibrosis, chronic hypoxemia on home O2, as well as coronary artery disease presented to the [facility] on July 23 with a chief complaint of cough and shortness of breath of 1 day's duration.  He indicated it was getting progressively worse.  Cough was reported to be productive with yellow sputum.  He denied any pleuritic chest pain or fever or chills or  rigors.  At the same time, he complained about swelling of his lower extremities that has been getting progressively worse.  He denies any history of vomiting but claimed he felt nauseated.  He gave a history of PND but denied orthopnea or chest pain or abdominal discomfort.  No reports of diarrhea, hematochezia but said symptoms were getting progressively worse, hence [facility] sent him to [facility] for further evaluation.  HOSPITAL COURSE: 1. Acute-on-chronic hypoxemia, probably multifactorial, specifically     chronic obstructive pulmonary disease exacerbation, pneumonia,     acute diastolic heart failure, pulmonary fibrosis.  He was admitted     to the telemetry unit.  He was provided with oxygen and was started     on Rocephin and azithromycin.  On July 23, he was started on Solu-     Medrol.  Solu-Medrol was discontinued and he will be sent home on a     prednisone taper.  In addition, the patient had 5 days of Rocephin     and azithromycin and will go home on additional 3 days of Avelox     p.o.  He was started on Lasix and his hydrochlorothiazide was held.     He will go home on Lasix daily and we will continue to hold his hydrochlorothiazide.  At the time of discharge, the patient is at     his baseline which is 3 L of oxygen via nasal cannula and an oxygen     saturation range of 92 to 96. 2. Leukocytosis, probably secondary to Solu-Medrol.  The patient     remained afebrile.  Blood cultures were negative. 3. Diabetes, type 2, uncontrolled, probably secondary to steroids.     Initially, the patient's home regimen was cut in half.  As his     appetite improved, he was gradually put back on his home regimen of     78 units of Lantus and sliding scale coverage.  His hemoglobin A1c     was 8.5.  We will follow up with his primary care provider for     further monitoring. 4. Hypertension, his blood pressure remained stable.  During his     hospitalization, he was  continued on his home medications. 5. Hyponatremia, was repleted and is resolved.  On July 25, his sodium     was 135. 6. Hypothyroidism.  The patient continued on his home dosage 325 mcg     of Synthroid. 7. Coronary artery disease.  See problem #1.  A 2-D echo was performed     as stated above.  The patient will follow up with his primary care     provider who will determine if Cardiology consult is needed. 8. Depression and anxiety, remained stable and at baseline during this  hospitalization. 9. Benign prostatic hypertrophy, remained stable during this     hospitalization.  He remained on his home medicine. 10.Hyperlipidemia.  Lipid profile as stated above.  The patient     continues his Crestor during this hospitalization and will continue     that at home.  PHYSICAL EXAMINATION:  VITAL SIGNS:  Temperature 97.6, blood pressure 135/71, heart rate 68, respirations 18, sats 92% on 3 L. GENERAL:  Awake, alert, ambulating in room.  Gait steady.  No acute distress. CV:  Regular rate and rhythm.  No murmur, gallop or rub.  Trace lower extremity edema.  Pedal pulse is present and palpable. RESPIRATORY:  Mild increased work of breathing with exertion.  Breath sounds distant but clear.  No wheeze, no rhonchi. ABDOMEN:  Round, soft.  Positive bowel sounds throughout.  Nontender to palpation. NEURO:  Alert and oriented x3.  Speech clear.  ACTIVITY:  As tolerated.  DIET:  Low-sodium, heart-healthy.  FOLLOWUP: 1. The patient has an appointment with Dr. Marchelle Gearing on July 27 at     2:45. 2. The patient has an appointment with Dr. [name] on July 31 at     2:15.  DISCHARGE INSTRUCTIONS:  He is to continue on his home oxygen and complete his prednisone taper.  DISPOSITION:  The patient is medically stable and ready for discharge.  Time spent on this discharge is 35 minutes.     Gwenyth Bender, NP     KMB/MEDQ  D:  02/20/2011  T:  02/20/2011  Job:  [id number]  cc:   Kalman Shan, MD [address] [employer] [address] [place] [address]  Electronically Signed by Toya Smothers  on 03/20/2011 07:48:31 AM

## 2011-03-20 NOTE — Assessment & Plan Note (Addendum)
Mild flare with suspected diastolic decompensation  He has hx of hypercarbia   Plan:  Begin Aldactone 25mg  Twice daily   Stop Hydrochlorothiazide (HCTZ).  Low salt diet  Legs elevated and support hose As needed   Please contact office for sooner follow up if symptoms do not improve or worsen or seek emergency care  follow up in 2 weeks with Chanler Schreiter or Dr. Marchelle Gearing

## 2011-03-21 NOTE — Progress Notes (Signed)
Agree with NP asessment of possible diast dysfn. Will have to monitor. Cor Pulmonale is also possibility. Let us see what cards says

## 2011-03-24 ENCOUNTER — Encounter: Payer: Self-pay | Admitting: Cardiovascular Disease

## 2011-03-25 ENCOUNTER — Ambulatory Visit (INDEPENDENT_AMBULATORY_CARE_PROVIDER_SITE_OTHER): Payer: Medicare Other | Admitting: Cardiovascular Disease

## 2011-03-25 ENCOUNTER — Encounter: Payer: Self-pay | Admitting: Cardiovascular Disease

## 2011-03-25 DIAGNOSIS — I519 Heart disease, unspecified: Secondary | ICD-10-CM

## 2011-03-25 DIAGNOSIS — J9610 Chronic respiratory failure, unspecified whether with hypoxia or hypercapnia: Secondary | ICD-10-CM

## 2011-03-25 DIAGNOSIS — I10 Essential (primary) hypertension: Secondary | ICD-10-CM

## 2011-03-25 DIAGNOSIS — I447 Left bundle-branch block, unspecified: Secondary | ICD-10-CM

## 2011-03-25 DIAGNOSIS — I5189 Other ill-defined heart diseases: Secondary | ICD-10-CM

## 2011-03-25 NOTE — Assessment & Plan Note (Signed)
Echo finding only limited specificity based on age and history of HTN. Not clinically significant with BNP of 9 in hospital

## 2011-03-25 NOTE — Assessment & Plan Note (Signed)
Well controlled.  Continue current medications and low sodium Dash type diet.    

## 2011-03-25 NOTE — Assessment & Plan Note (Signed)
Chronic  No evidence of syncope or high grade heart block.  Given severity of pulmonary diseae I donet think myovue in order unless he has chest pain.

## 2011-03-25 NOTE — Progress Notes (Signed)
69 yowm quit smoking around 2000 with COPD by PFT's 09/2009.  100 pack year smoker. Home O2 dependent with low DLCO 35% in PFTs MArch 2011. BAeline pulse ox is 87-93% on 2L [place]. In, OCt 2011 Ct showing some UIP component but predmoninant COPD on CT. Also CT with Rt pleural plaque  Seen in hospital recently  Reviewed echo.  Normal EF.  No cor pulmonale.  Suggested stage 2 diastolic dysfunction with increased atrial contribution.  Not very significant in this age group where gradually get more atrial contribution.  No clinical diastolic CHF.  BNP in hopsital only 9 and no pulmonary edema.  Has not had clinical CHF.  Chronic LE edema fro varicose veins.  No chest pain.  Dyspnea worsening over last 6 months.  Some depression about severity of COPD.  ECG;s reviewed.  Has old LBBB seen as far back as July 2010.  Told patient I did not think heart was contributing to dyspnea and no further w/u needed.  No signs of pulomonary HTN on echo  ROS: Denies fever, malais, weight loss, blurry vision, decreased visual acuity, cough, sputum, SOB, hemoptysis, pleuritic pain, palpitaitons, heartburn, abdominal pain, melena, lower extremity edema, claudication, or rash.  All other systems reviewed and negative   General: Affect appropriate Chronically ill on oxygen HEENT: normal Neck supple with no adenopathy JVP normal no bruits no thyromegaly Lungs clear with no wheezing and good diaphragmatic motion Heart:  S1/S2 no murmur,rub, gallop or click PMI normal Abdomen: benighn, BS positve, no tenderness, no AAA no bruit.  No HSM or HJR Distal pulses intact with no bruits Plus one bilateal edema with varicose veins Neuro non-focal Skin warm and dry No muscular weakness  Medications Current Outpatient Prescriptions  Medication Sig Dispense Refill  . albuterol (PROAIR HFA) 108 (90 BASE) MCG/ACT inhaler Inhale 2 puffs into the lungs as needed. Shortness of breath      . aspirin 81 MG tablet Take 81 mg by mouth daily.         . Cholecalciferol (VITAMIN D3) 2000 UNITS TABS Take 1 tablet by mouth daily.       [name] DEPO-TESTOSTERONE 200 MG/ML injection Inject 200 mg into the muscle every 14 (fourteen) days.      [name] doxepin (SINEQUAN) 10 MG capsule Take 10 mg by mouth 3 (three) times daily.       [name] doxepin (SINEQUAN) 50 MG capsule Take 50 mg by mouth at bedtime.        . finasteride (PROSCAR) 5 MG tablet Take 5 mg by mouth daily.        . formoterol (FORADIL AEROLIZER) 12 MCG capsule for inhaler Inhale contents of 1 capsule two times a day       . furosemide (LASIX) 40 MG tablet Take 1 tablet (40 mg total) by mouth daily.  90 tablet  3  . gabapentin (NEURONTIN) 800 MG tablet Take 800 mg by mouth 2 (two) times daily.        . insulin aspart (NOVOLOG) 100 UNIT/ML injection 10-20 Units 3 (three) times daily before meals. Sliding scale as needed with meals (12 units in the morning 10 units at lunch and 22 units at dinner)      . insulin glargine (LANTUS) 100 UNIT/ML injection Inject 82 Units into the skin at bedtime.       [name] levothyroxine (LEVOTHROID) 25 MCG tablet Take 1 tablet (25 mcg total) by mouth daily.  90 tablet  3  . levothyroxine (SYNTHROID, LEVOTHROID) 200  MCG tablet 1 tablet by mouth every morning and every evening       . losartan (COZAAR) 100 MG tablet 1/2 by mouth once daily       . metFORMIN (GLUCOPHAGE) 1000 MG tablet Take 1,000 mg by mouth 2 (two) times daily.        . mometasone (ASMANEX 60 METERED DOSES) 220 MCG/INH inhaler Inhale 2 puffs into the lungs daily.        . Multiple Vitamins-Minerals (MEGA MULTIVITAMIN FOR MEN PO) Take by mouth daily.        . naphazoline (CLEAR EYES) 0.012 % ophthalmic solution Place 1 drop into both eyes as needed. Red eyes       . niacin (SLO-NIACIN) 500 MG tablet Take 500 mg by mouth at bedtime.        [name] PLAQUENIL 200 MG tablet Take 1 tablet by mouth Daily.      [name] rOPINIRole (REQUIP) 1 MG tablet Take 1 mg by mouth at bedtime.        . simvastatin (ZOCOR) 20 MG tablet Take  5 mg by mouth at bedtime.       [name] spironolactone (ALDACTONE) 25 MG tablet Take 1 tablet (25 mg total) by mouth 2 (two) times daily.  60 tablet  11  . Tamsulosin HCl (FLOMAX) 0.4 MG CAPS Take by mouth at bedtime.        [name] tiotropium (SPIRIVA HANDIHALER) 18 MCG inhalation capsule Place 18 mcg into inhaler and inhale daily.        [name] zolpidem (AMBIEN) 5 MG tablet Take 5 mg by mouth at bedtime.        . B-D INS SYR ULTRAFINE 1CC/30G 30G X 1/2" 1 ML MISC         Allergies Review of patient's allergies indicates no known allergies.  Family History: No family history on file.  Social History: History   Social History  . Marital Status: Married    Spouse Name: N/A    Number of Children: N/A  . Years of Education: N/A   Occupational History  . disabled veteran, Ex Human resources officer    Social History Main Topics  . Smoking status: Former Smoker -- 2.5 packs/day for 40 years    Types: Cigarettes, Pipe, Cigars    Quit date: 07/28/1998  . Smokeless tobacco: Never Used   Comment: quit in 2000 x 40 yrs. 2.5 ppd  . Alcohol Use: No  . Drug Use: No  . Sexually Active: Not on file   Other Topics Concern  . Not on file   Social History Narrative   [facility] in the home [facility]Se[facility].Denies asbestos exposure    Electrocardiogram:  NSR 80 LAD LBBB no change from 7/10  Assessment and Plan

## 2011-03-25 NOTE — Assessment & Plan Note (Signed)
F/U pulmonary  No need for vasodilators based on echo.  Oxygen high flow  Flu and pneumonia shots

## 2011-04-03 ENCOUNTER — Ambulatory Visit: Payer: Medicare Other | Admitting: Adult Health

## 2011-04-07 ENCOUNTER — Ambulatory Visit: Payer: Medicare Other | Admitting: Adult Health

## 2011-04-11 ENCOUNTER — Ambulatory Visit (INDEPENDENT_AMBULATORY_CARE_PROVIDER_SITE_OTHER): Payer: Medicare Other | Admitting: Internal Medicine

## 2011-04-11 ENCOUNTER — Telehealth: Payer: Self-pay | Admitting: Internal Medicine

## 2011-04-11 ENCOUNTER — Encounter: Payer: Self-pay | Admitting: Internal Medicine

## 2011-04-11 VITALS — BP 128/58 | HR 92 | Temp 98.4°F | Ht 75.0 in | Wt 261.4 lb

## 2011-04-11 DIAGNOSIS — J449 Chronic obstructive pulmonary disease, unspecified: Secondary | ICD-10-CM

## 2011-04-11 DIAGNOSIS — R609 Edema, unspecified: Secondary | ICD-10-CM

## 2011-04-11 NOTE — Patient Instructions (Addendum)
Your leg swelling is due to varicose veins  You can continue your aldactone - will copy your cardiologist dr Eden Emms so he is aware I think you should call Dr Jonny Ruiz your PMD and make sure it is okay to wear compression stocking if you are concerned about harmful effects of it You can also talk to Dr Jonny Ruiz abou referral to vascualr surgeon for varicose veins Your copd appears stable Congratulations on flu shot Return to see me in 4 months or sooner if sick

## 2011-04-11 NOTE — Telephone Encounter (Signed)
Yes, ok for compression stocking

## 2011-04-11 NOTE — Telephone Encounter (Signed)
[name]  He is on lasix for his diast chf. Last month my np changed his hctz to aldactone on account of peripheral edema. Is it okay to continue both these diuretics from your standpoint  THanks  Mr

## 2011-04-11 NOTE — Progress Notes (Signed)
Subjective:    Patient ID: [name], male    DOB: [birth year], 70 y.o.   MRN: [id number]  HPI 26  yowm Problem list 1. quit smoking around 2000 , 100 pack ex-smomer 2. GOLD III COPD by PFT's March 2011 and jan 2012 - with baseline pulse ox 87-93% 2L[place] - nov 2011. - Meds through [facility]. S/p pulm rehab Jan 2012 3. 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 RF 352. CRP 10 - Dec 2011 4. Effort tolerance 1/4 mile as of Nov 2011 due to claudication and dyspnea 5. Rheumatoid Arthrtis - since 2000. Hx of enbrel - rheum [facility] 6. Recc AECOPD   - Jan 2012 - OPD Rx - July 2012 - Admitted 7. Diastolic CHF  - ECHO July 2012: ejection fraction of 55% to 60% with grade 2      diastolic dysfunction.  Mitral valve with moderately calcified   8. Hx of asbestos exposure with asbestos plaques on CT in 2011    OV 04/09/11: Fu for above. He is most concerned about his pedal edema. [place]ederate intensity. Present for 3 months. He saw my NP 3 weeks ago and was stated on aldactone; this is helping somewhat only. Worst at end of day. NP advised Aldactone last month instead of HCTZ. He is also on lasix. Not sure he should be on it per him. He has varicose veins. He is worroied about harmful effects of compression stockings (ulcers due to dm).   In terms of copd: stable. Feels recent AECOPD has made his baseline somewhat worse. Otheerwise, stable class 2-3 dyspnea. DEnies worsening fever, sputum, chest pain or wheeze   Past Medical History  Diagnosis Date  . THRUSH 11/06/2009  . HYPOTHYROIDISM 07/30/2009  . DIABETES MELLITUS, TYPE II 11/23/2009  . DEPRESSION 11/23/2009  . 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 HYPERTENSION 07/30/2009  . CORONARY ARTERY DISEASE 11/23/2009  . CHRONIC OBSTRUCTIVE PULMONARY DISEASE, ACUTE EXACERBATION 03/01/2010  . C O P D 07/30/2009  . 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 RESPIRATORY FAILURE, CHRONIC 07/31/2009  . BENIGN PROSTATIC HYPERTROPHY 11/23/2009  .  DEGENERATIVE JOINT DISEASE 11/23/2009  . 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 FATIGUE 11/23/2009  . 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 GAIT DISTURBANCE 12/10/2009  . DYSPNEA/SHORTNESS OF BREATH 12/08/2009  . HEMOPTYSIS UNSPECIFIED 05/07/2010     No family history on file.   History   Social History  . Marital Status: Married    Spouse Name: N/A    Number of Children: N/A  . Years of Education: N/A   Occupational History  . disabled veteran, Ex Human resources officer    Social History Main Topics  . Smoking status: Former Smoker -- 2.5 packs/day for 40 years    Types: Cigarettes, Pipe, Cigars    Quit date: 07/28/1998  . Smokeless tobacco: Never Used   Comment: quit in 2000 x 40 yrs. 2.5 ppd  . Alcohol Use: No  . Drug Use: No  . Sexually Active: Not on file   Other Topics Concern  . Not on file   Social History Narrative   Lives in the home with [facility] wifeSe[facility].Denies asbestos exposure     No Known Allergies   Outpatient Prescriptions Prior to Visit  Medication Sig Dispense Refill  . albuterol (PROAIR HFA) 108 (90 BASE) MCG/ACT inhaler Inhale 2 puffs into the lungs as needed. Shortness of breath      . aspirin 81 MG tablet Take  81 mg by mouth daily.        . B-D INS SYR ULTRAFINE 1CC/30G 30G X 1/2" 1 ML MISC       . Cholecalciferol (VITAMIN D3) 2000 UNITS TABS Take 1 tablet by mouth daily.       [name] DEPO-TESTOSTERONE 200 MG/ML injection Inject 200 mg into the muscle every 14 (fourteen) days.      [name] doxepin (SINEQUAN) 10 MG capsule Take 10 mg by mouth 3 (three) times daily.       [name] doxepin (SINEQUAN) 50 MG capsule Take 50 mg by mouth at bedtime.        . finasteride (PROSCAR) 5 MG tablet Take 5 mg by mouth daily.        . formoterol (FORADIL AEROLIZER) 12 MCG capsule for inhaler Inhale contents of 1 capsule two times a day       . furosemide (LASIX) 40 MG tablet Take 1 tablet (40 mg total) by mouth daily.  90 tablet  3  . gabapentin (NEURONTIN) 800 MG tablet Take 800 mg  by mouth 2 (two) times daily.        . insulin aspart (NOVOLOG) 100 UNIT/ML injection 10-20 Units 3 (three) times daily before meals. Sliding scale as needed with meals (12 units in the morning 10 units at lunch and 22 units at dinner)      . insulin glargine (LANTUS) 100 UNIT/ML injection Inject 82 Units into the skin at bedtime.       [name] levothyroxine (LEVOTHROID) 25 MCG tablet Take 1 tablet (25 mcg total) by mouth daily.  90 tablet  3  . levothyroxine (SYNTHROID, LEVOTHROID) 200 MCG tablet 1 tablet by mouth every morning and every evening       . losartan (COZAAR) 100 MG tablet 1/2 by mouth once daily       . metFORMIN (GLUCOPHAGE) 1000 MG tablet Take 1,000 mg by mouth 2 (two) times daily.        . mometasone (ASMANEX 60 METERED DOSES) 220 MCG/INH inhaler Inhale 2 puffs into the lungs daily.        . Multiple Vitamins-Minerals (MEGA MULTIVITAMIN FOR MEN PO) Take by mouth daily.        . naphazoline (CLEAR EYES) 0.012 % ophthalmic solution Place 1 drop into both eyes as needed. Red eyes       . niacin (SLO-NIACIN) 500 MG tablet Take 500 mg by mouth at bedtime.        [name] PLAQUENIL 200 MG tablet Take 1 tablet by mouth Daily.      [name] rOPINIRole (REQUIP) 1 MG tablet Take 1 mg by mouth at bedtime.        . simvastatin (ZOCOR) 20 MG tablet Take 5 mg by mouth at bedtime.       [name] spironolactone (ALDACTONE) 25 MG tablet Take 1 tablet (25 mg total) by mouth 2 (two) times daily.  60 tablet  11  . Tamsulosin HCl (FLOMAX) 0.4 MG CAPS Take by mouth at bedtime.        [name] tiotropium (SPIRIVA HANDIHALER) 18 MCG inhalation capsule Place 18 mcg into inhaler and inhale daily.        [name] zolpidem (AMBIEN) 5 MG tablet Take 5 mg by mouth at bedtime.           Review of Systems  Constitutional: Negative for fever and unexpected weight change.  HENT: Negative for ear pain, nosebleeds, congestion, sore throat, rhinorrhea, sneezing, trouble swallowing, dental problem, postnasal drip  and sinus pressure.   Eyes: Negative for  redness and itching.  Respiratory: Negative for cough, chest tightness, shortness of breath and wheezing.   Cardiovascular: Negative for palpitations and leg swelling.  Gastrointestinal: Negative for nausea and vomiting.  Genitourinary: Negative for dysuria.  Musculoskeletal: Negative for joint swelling.  Skin: Negative for rash.  Neurological: Negative for headaches.  Hematological: Does not bruise/bleed easily.  Psychiatric/Behavioral: Negative for dysphoric mood. The patient is not nervous/anxious.        Objective:   Physical Exam General:  well developed, well nourished, in no acute distressobese.   Head:  normocephalic and atraumatic Eyes:  PERRLA/EOM intact; conjunctiva and sclera clear Ears:  TMs intact and clear with normal canals Nose:  no deformity, discharge, inflammation, or lesions Mouth:  no deformity or lesions Neck:  no masses, thyromegaly, or abnormal cervical nodes Chest Wall:  no deformities noted Lungs:  decreased BS bilateral and coarse velcro crackles at bases  Heart:  regular rate and rhythm, S1, S2 without murmurs, rubs, gallops, or clicks, 2+ edema  Abdomen:  bowel sounds positive; abdomen soft and non-tender without masses, or organomegaly Msk:  no deformity or scoliosis noted with normal posture Pulses:  pulses normal Extremities:  no clubbing, cyanosis, or deformity noted. BILATERAL VARICOSE VEINS +++ Neurologic:  CN II-XII grossly intact with normal reflexes, coordination, muscle strength and tone Skin:  intact without lesions or rashes Cervical Nodes:  no significant adenopathy Axillary Nodes:  no significant adenopathy Psych:  alert and cooperative; normal mood and affect; normal attention span and concentration        Assessment & Plan:

## 2011-04-11 NOTE — Assessment & Plan Note (Signed)
He is most concerned about this. LAsix and aldactone helping only somewhat. He has varicose veins bilaterally. Advisd compresion stockings but he is afraid this might cause ulcers due to presnce of diabetes. Gave option of vascular service eval but he is not ready for it now. Advised him to talk to PMD about it. Wants to know if he is to continue his diuretics - advised him to talk to cards about it

## 2011-04-11 NOTE — Telephone Encounter (Signed)
Dr Jonny Ruiz  Patient would like to clarify if okay to use compression stocking for varicose veins in poresence of diabetes. Please have your nurse respond to this question Thanks  MR

## 2011-04-11 NOTE — Assessment & Plan Note (Signed)
clnincally stable  Plan Continue current medications Flu vaccination status verified Continue to monitor

## 2011-04-14 NOTE — Telephone Encounter (Signed)
HIs BNP';s are low and his K runs high.  I would not have him on aldactone

## 2011-04-14 NOTE — Telephone Encounter (Signed)
Jen (cc to DR Eden Emms and Dr. Jonny Ruiz)  Please tell patient that I spoke to   A) Dr Eden Emms who has recommeneded he not be on aldactone because potassium tends to run high. If swelling gets worse he can restart HCTZ and Dr Quitman Livings Jonny Ruiz can adjust this  B) DR Jonny Ruiz who feels compression stockings will not harm him and I would recommend it especially because edema due to varciose veins  Thanks MR

## 2011-04-25 NOTE — Telephone Encounter (Signed)
Pt aware. [name], CMA  

## 2011-04-25 NOTE — Telephone Encounter (Signed)
[email or web address].Clearnce Leja, CMA  

## 2011-04-29 ENCOUNTER — Ambulatory Visit (INDEPENDENT_AMBULATORY_CARE_PROVIDER_SITE_OTHER): Payer: Medicare Other | Admitting: Adult Health

## 2011-04-29 ENCOUNTER — Encounter: Payer: Self-pay | Admitting: Adult Health

## 2011-04-29 VITALS — BP 102/70 | HR 87 | Ht 75.0 in | Wt 259.1 lb

## 2011-04-29 DIAGNOSIS — J449 Chronic obstructive pulmonary disease, unspecified: Secondary | ICD-10-CM

## 2011-04-29 MED ORDER — DOXYCYCLINE HYCLATE 100 MG PO TABS: 100.0000 mg | ORAL_TABLET | Freq: Two times a day (BID) | ORAL | Status: DC

## 2011-04-29 NOTE — Patient Instructions (Signed)
Doxycycline 100mg  Twice daily  For 7 days  Mucinex DM Twice daily  As needed  Cough/congestion  Continue on Asmanex, Foradil and Spiriva  follow up Dr. Marchelle Gearing in 6 weeks  And As needed   Low salt diet, legs elevated.

## 2011-04-29 NOTE — Progress Notes (Deleted)
Subjective:    Patient ID: [name], male    DOB: 03-Nov-1940, 70 y.o.   MRN: [id number]  HPI History of Present Illness: 51  yowm quit smoking around 2000 with GOLD III COPD by PFT's 09/2009. Meds through [facility]  June 18, 2010: EX 100 pack year smoker. Home O2 dependent Gold stage 3 COPD with low DLCO 35%  in PFTs MArch 2011. BAeline pulse ox is 87-93% on 2L [place]. In,  OCt 2011 Ct showing some UIP component but predmoninant COPD on CT. 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 Sherene Sires to myself.  In Oct 2011 had some mild AECOPD related hemoptysis with CT showing only UIP but not showing any mass. Since then hemoptysis resolved. Currently feels baseline on 2-3L [place]. Claudication lmits him more than dyspnea at 1/4 th mile. Denies chronic cough but on mucinex and feels it helps. On spiriva, foradil and asmanex through [facility]. Has not done rehab though wife is attending rehab. Denies chest pains, edema, wheezing, fever, colored sputum. REC: CHECK AUTOIMMUNE PROFILE,  START NAC, GET FULL PFTs   August 12, 2010: Followup. in interim was admitted for sBP12/19/2011 due to [facility] from prior laparotomies. Resolved with conservative Rx. Started pulmnary rehab 2 weeks ago. Attend 4 sesssions. It is helping so far and he likes it. No new complaints. Dyspnea is stable class 2-3. Denies associated cough, chest pain, hemoptysis, edema, sputum, weight loss. Had autoimmune profile 07/03/2010 - CRP 10, RF 352 but otherwise negative. Upon revealing labs he states he is known to have RA diagnosed over 10 years ago and was on enbrel. He is due to see a rheumatologist at [place], [place]. PFTs today continue to show Gold stage 3 COPD and somewhat improved from a year ago.  August 27, 2010 --Presents for an acute office visit.Complains of increased sob,  dry cough, wheezing. Pt states he doesn't feel like he is getting enough oxygen -cant get  enough air. Retaining more fluid. Denies chest pain, orthopnea, hemoptysis, fever, n/v/d,  headache,recent travel. RX: DOXY AND PRED   OV 02/21/2011: Admitted 7/22 through 7/26 for Acute-on-chronic hypoxemia, probably with chronic obstructive       pulmonary disease exacerbation, pneumonia, acute congestive heart      Failure, (presented with dyspnea, edema, cough). est x-ray on July 22 yield chronic lung changes without acute      overlying pulmonary process.  A 2-D echo yields an ejection fraction of 55% to 60% with grade 2      diastolic dysfunction.  Mitral valve with moderately calcified      Annulus. ( I personally reviewed thos results)  Moderately thickened leaflets. RX: DIURESIS, PRED, AVELOX and O2   Today  Followup from above hospitalization. States he no longer has worsening dyspnea, edema, sputum, cough, fever, chills. Back to baseline o2 need of 3L [place]. However, feels fatigued today more than yesterday. Feels is due to benadryl which he is taking for insomnia. Was talking Remus Loffler but he was falling as a result and stopped. He has class 3 dyspnea at baseline. Has completed rehab.  Compliant with meds>>no changes  04/29/2011 Acute OV  Complains of persistent leg swelling. Increased DOE with actiivity . Wears out easily. Lasix has been increased to 40mg  daily with limitedresponse. Weight trending up , now up 10 lbs over last month. Does have cards consult next week. Previous echo last month during hospital admit showed LV nml w/ grade 2 diastolic  dysfnx. No chest pain , increaesed cough or congestion     Past Medical History  Diagnosis Date  . THRUSH 11/06/2009  . HYPOTHYROIDISM 07/30/2009  . DIABETES MELLITUS, TYPE II 11/23/2009  . DEPRESSION 11/23/2009  . 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 HYPERTENSION 07/30/2009  . CORONARY ARTERY DISEASE 11/23/2009  . CHRONIC OBSTRUCTIVE PULMONARY DISEASE, ACUTE EXACERBATION 03/01/2010  . C O P D 07/30/2009  . 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 RESPIRATORY  FAILURE, CHRONIC 07/31/2009  . BENIGN PROSTATIC HYPERTROPHY 11/23/2009  . DEGENERATIVE JOINT DISEASE 11/23/2009  . 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 FATIGUE 11/23/2009  . 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 GAIT DISTURBANCE 12/10/2009  . DYSPNEA/SHORTNESS OF BREATH 12/08/2009  . HEMOPTYSIS UNSPECIFIED 05/07/2010     No family history on file.   History   Social History  . Marital Status: Married    Spouse Name: N/A    Number of Children: N/A  . Years of Education: N/A   Occupational History  . disabled veteran, Ex Human resources officer    Social History Main Topics  . Smoking status: Former Smoker -- 2.5 packs/day for 40 years    Types: Cigarettes, Pipe, Cigars    Quit date: 07/28/1998  . Smokeless tobacco: Never Used   Comment: quit in 2000 x 40 yrs. 2.5 ppd  . Alcohol Use: No  . Drug Use: No  . Sexually Active: Not on file   Other Topics Concern  . Not on file   Social History Narrative   Lives in the home [facility]Se[facility].Denies asbestos exposure     No Known Allergies   Outpatient Prescriptions Prior to Visit  Medication Sig Dispense Refill  . albuterol (PROAIR HFA) 108 (90 BASE) MCG/ACT inhaler Inhale 2 puffs into the lungs as needed. Shortness of breath      . aspirin 81 MG tablet Take 81 mg by mouth daily.        . B-D INS SYR ULTRAFINE 1CC/30G 30G X 1/2" 1 ML MISC       . Cholecalciferol (VITAMIN D3) 2000 UNITS TABS Take 1 tablet by mouth daily.       [name] DEPO-TESTOSTERONE 200 MG/ML injection Inject 200 mg into the muscle every 14 (fourteen) days.      [name] doxepin (SINEQUAN) 10 MG capsule Take 10 mg by mouth 3 (three) times daily.       . finasteride (PROSCAR) 5 MG tablet Take 5 mg by mouth daily.        . formoterol (FORADIL AEROLIZER) 12 MCG capsule for inhaler Inhale contents of 1 capsule two times a day       . furosemide (LASIX) 40 MG tablet Take 1 tablet (40 mg total) by mouth daily.  90 tablet  3  . gabapentin (NEURONTIN) 800 MG tablet Take 800 mg by  mouth 2 (two) times daily.        . insulin aspart (NOVOLOG) 100 UNIT/ML injection 10-20 Units 3 (three) times daily before meals. Sliding scale as needed with meals (12 units in the morning 10 units at lunch and 20 units at dinner)      . levothyroxine (LEVOTHROID) 25 MCG tablet Take 1 tablet (25 mcg total) by mouth daily.  90 tablet  3  . levothyroxine (SYNTHROID, LEVOTHROID) 200 MCG tablet 1 tablet by mouth every morning and every evening       . losartan (COZAAR) 100 MG tablet 1/2 by mouth once daily       .  metFORMIN (GLUCOPHAGE) 1000 MG tablet Take 1,000 mg by mouth 2 (two) times daily.        . mometasone (ASMANEX 60 METERED DOSES) 220 MCG/INH inhaler Inhale 2 puffs into the lungs daily.        . Multiple Vitamins-Minerals (MEGA MULTIVITAMIN FOR MEN PO) Take by mouth daily.        . naphazoline (CLEAR EYES) 0.012 % ophthalmic solution Place 1 drop into both eyes as needed. Red eyes       . niacin (SLO-NIACIN) 500 MG tablet Take 500 mg by mouth at bedtime.        [name] PLAQUENIL 200 MG tablet Take 1 tablet by mouth Daily.      [name] rOPINIRole (REQUIP) 1 MG tablet Take 1 mg by mouth at bedtime.        . Tamsulosin HCl (FLOMAX) 0.4 MG CAPS Take by mouth at bedtime.        [name] tiotropium (SPIRIVA HANDIHALER) 18 MCG inhalation capsule Place 18 mcg into inhaler and inhale daily.        . insulin glargine (LANTUS) 100 UNIT/ML injection Inject 70 Units into the skin at bedtime.  10 mL  5  . doxepin (SINEQUAN) 50 MG capsule Take 50 mg by mouth at bedtime.        . simvastatin (ZOCOR) 20 MG tablet Take 5 mg by mouth at bedtime.       [name] zolpidem (AMBIEN) 5 MG tablet Take 5 mg by mouth at bedtime.        . hydrochlorothiazide 25 MG tablet 1/2 by mouth once daily               Review of Systems  Constitutional: Negative for fever and unexpected weight change.  HENT:Negative for ear pain, nosebleeds, congestion, sore throat, trouble swallowing, dental problem, postnasal drip and sinus pressure.   Eyes:  Negative for redness and itching.  Respiratory: Negative for cough, chest tightness, shortness of breath and wheezing.   Cardiovascular: Positive for leg swelling. Negative for palpitations.  Gastrointestinal: Negative for nausea and vomiting.  Genitourinary: Negative for dysuria.  Musculoskeletal: Negative for joint swelling.  Skin: Negative for rash.  Neurological: Negative for headaches.  Hematological: Does not bruise/bleed easily.  Psychiatric/Behavioral: Negative for dysphoric mood. The patient is not nervous/anxious.        Objective:   Physical Exam General:  well developed, well nourished, in no acute distressobese.   Head:  normocephalic and atraumatic Eyes:  PERRLA/EOM intact; conjunctiva and sclera clear Ears:  TMs intact and clear with normal canals Nose:  no deformity, discharge, inflammation, or lesions Mouth:  no deformity or lesions Neck:  no masses, thyromegaly, or abnormal cervical nodes Chest Wall:  no deformities noted Lungs:  decreased BS bilateral and coarse velcro crackles at bases  Heart:  regular rate and rhythm, S1, S2 without murmurs, rubs, gallops, or clicks, 2+ edema  Abdomen:  bowel sounds positive; abdomen soft and non-tender without masses, or organomegaly Msk:  no deformity or scoliosis noted with normal posture Pulses:  pulses normal Extremities:  no clubbing, cyanosis, or deformity noted Neurologic:  CN II-XII grossly intact with normal reflexes, coordination, muscle strength and tone Skin:  intact without lesions or rashes Cervical Nodes:  no significant adenopathy Axillary Nodes:  no significant adenopathy Psych:  alert and cooperative; normal mood and affect; normal attention span and concentration        Assessment & Plan:   No problem-specific assessment & plan notes found  for this encounter.  HPI   Review of Systems   Physical Exam

## 2011-04-29 NOTE — Assessment & Plan Note (Signed)
Mild Flare   Plan:  Doxycycline 100mg  Twice daily  For 7 days  Mucinex DM Twice daily  As needed  Cough/congestion  Continue on Asmanex, Foradil and Spiriva  follow up Dr. Marchelle Gearing in 6 weeks  And As needed   Low salt diet, legs elevated.

## 2011-04-29 NOTE — Progress Notes (Signed)
Subjective:    Patient ID: [name], male    DOB: 04-24-1941, 70 y.o.   MRN: [id number]  HPI History of Present Illness: 53  yowm quit smoking around 2000 with GOLD III COPD by PFT's 09/2009. Meds through [facility]  June 18, 2010: EX 100 pack year smoker. Home O2 dependent Gold stage 3 COPD with low DLCO 35%  in PFTs MArch 2011. BAeline pulse ox is 87-93% on 2L [place]. In,  OCt 2011 Ct showing some UIP component but predmoninant COPD on CT. 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 Sherene Sires to myself.  In Oct 2011 had some mild AECOPD related hemoptysis with CT showing only UIP but not showing any mass. Since then hemoptysis resolved. Currently feels baseline on 2-3L [place]. Claudication lmits him more than dyspnea at 1/4 th mile. Denies chronic cough but on mucinex and feels it helps. On spiriva, foradil and asmanex through [facility]. Has not done rehab though wife is attending rehab. Denies chest pains, edema, wheezing, fever, colored sputum. REC: CHECK AUTOIMMUNE PROFILE,  START NAC, GET FULL PFTs   August 12, 2010: Followup. in interim was admitted for sBP12/19/2011 due to [facility] from prior laparotomies. Resolved with conservative Rx. Started pulmnary rehab 2 weeks ago. Attend 4 sesssions. It is helping so far and he likes it. No new complaints. Dyspnea is stable class 2-3. Denies associated cough, chest pain, hemoptysis, edema, sputum, weight loss. Had autoimmune profile 07/03/2010 - CRP 10, RF 352 but otherwise negative. Upon revealing labs he states he is known to have RA diagnosed over 10 years ago and was on enbrel. He is due to see a rheumatologist at [place], [place]. PFTs today continue to show Gold stage 3 COPD and somewhat improved from a year ago.  August 27, 2010 --Presents for an acute office visit.Complains of increased sob,  dry cough, wheezing. Pt states he doesn't feel like he is getting enough oxygen -cant get  enough air. Retaining more fluid. Denies chest pain, orthopnea, hemoptysis, fever, n/v/d,  headache,recent travel. RX: DOXY AND PRED   OV 02/21/2011: Admitted 7/22 through 7/26 for Acute-on-chronic hypoxemia, probably with chronic obstructive       pulmonary disease exacerbation, pneumonia, acute congestive heart      Failure, (presented with dyspnea, edema, cough). est x-ray on July 22 yield chronic lung changes without acute      overlying pulmonary process.  A 2-D echo yields an ejection fraction of 55% to 60% with grade 2      diastolic dysfunction.  Mitral valve with moderately calcified      Annulus. ( I personally reviewed thos results)  Moderately thickened leaflets. RX: DIURESIS, PRED, AVELOX and O2   Today  Followup from above hospitalization. States he no longer has worsening dyspnea, edema, sputum, cough, fever, chills. Back to baseline o2 need of 3L [place]. However, feels fatigued today more than yesterday. Feels is due to benadryl which he is taking for insomnia. Was talking Remus Loffler but he was falling as a result and stopped. He has class 3 dyspnea at baseline. Has completed rehab.  Compliant with meds>>no changes  03/20/11  Acute OV  Complains of persistent leg swelling. Increased DOE with actiivity . Wears out easily. Lasix has been increased to 40mg  daily with limitedresponse. Weight trending up , now up 10 lbs over last month. Does have cards consult next week. Previous echo last month during hospital admit showed LV nml w/ grade 2  diastolic dysfnx. No chest pain , increaesed cough or congestion  >>Aldactone 25mg  Twice daily  rx   04/29/2011 Follow up  Pt returns for follow up. Last ov with increased leg swelling despite lasix 40mg . Aldactone 25mg  Twice daily was added to his regimen. He says this did help some however was taken off per cardiology recommendations. He says leg swelling is about the same.  Has gotten some TEDS stocking to wear daily .   Does complain of increased congestion for  last few days with green mucus. Wife was recently tx for bronchitis. NO fever or hemoptysis. No recent abx use.  Taking spiriva , foradil and asmanex without known difficulty-gets meds via [place] system.      Past Medical History  Diagnosis Date  . THRUSH 11/06/2009  . HYPOTHYROIDISM 07/30/2009  . DIABETES MELLITUS, TYPE II 11/23/2009  . DEPRESSION 11/23/2009  . 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 HYPERTENSION 07/30/2009  . CORONARY ARTERY DISEASE 11/23/2009  . CHRONIC OBSTRUCTIVE PULMONARY DISEASE, ACUTE EXACERBATION 03/01/2010  . C O P D 07/30/2009  . 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 RESPIRATORY FAILURE, CHRONIC 07/31/2009  . BENIGN PROSTATIC HYPERTROPHY 11/23/2009  . DEGENERATIVE JOINT DISEASE 11/23/2009  . 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 FATIGUE 11/23/2009  . 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 GAIT DISTURBANCE 12/10/2009  . DYSPNEA/SHORTNESS OF BREATH 12/08/2009  . HEMOPTYSIS UNSPECIFIED 05/07/2010     No family history on file.   History   Social History  . Marital Status: Married    Spouse Name: N/A    Number of Children: N/A  . Years of Education: N/A   Occupational History  . disabled veteran, Ex Human resources officer    Social History Main Topics  . Smoking status: Former Smoker -- 2.5 packs/day for 40 years    Types: Cigarettes, Pipe, Cigars    Quit date: 07/28/1998  . Smokeless tobacco: Never Used   Comment: quit in 2000 x 40 yrs. 2.5 ppd  . Alcohol Use: No  . Drug Use: No  . Sexually Active: Not on file   Other Topics Concern  . Not on file   Social History Narrative   Lives in the home [facility] wifeSe[facility].Denies asbestos exposure     No Known Allergies   Outpatient Prescriptions Prior to Visit  Medication Sig Dispense Refill  . albuterol (PROAIR HFA) 108 (90 BASE) MCG/ACT inhaler Inhale 2 puffs into the lungs as needed. Shortness of breath      . aspirin 81 MG tablet Take 81 mg by mouth daily.        . B-D INS SYR ULTRAFINE 1CC/30G 30G X 1/2" 1 ML  MISC       . Cholecalciferol (VITAMIN D3) 2000 UNITS TABS Take 1 tablet by mouth daily.       [name] DEPO-TESTOSTERONE 200 MG/ML injection Inject 200 mg into the muscle every 14 (fourteen) days.      [name] doxepin (SINEQUAN) 10 MG capsule Take 10 mg by mouth 3 (three) times daily.       . finasteride (PROSCAR) 5 MG tablet Take 5 mg by mouth daily.        . formoterol (FORADIL AEROLIZER) 12 MCG capsule for inhaler Inhale contents of 1 capsule two times a day       . furosemide (LASIX) 40 MG tablet Take 1 tablet (40 mg total) by mouth daily.  90 tablet  3  . gabapentin (NEURONTIN) 800 MG tablet Take 800 mg  by mouth 2 (two) times daily.        . insulin aspart (NOVOLOG) 100 UNIT/ML injection 10-20 Units 3 (three) times daily before meals. Sliding scale as needed with meals (12 units in the morning 10 units at lunch and 20 units at dinner)      . levothyroxine (LEVOTHROID) 25 MCG tablet Take 1 tablet (25 mcg total) by mouth daily.  90 tablet  3  . levothyroxine (SYNTHROID, LEVOTHROID) 200 MCG tablet 1 tablet by mouth every morning and every evening       . losartan (COZAAR) 100 MG tablet 1/2 by mouth once daily       . metFORMIN (GLUCOPHAGE) 1000 MG tablet Take 1,000 mg by mouth 2 (two) times daily.        . mometasone (ASMANEX 60 METERED DOSES) 220 MCG/INH inhaler Inhale 2 puffs into the lungs daily.        . Multiple Vitamins-Minerals (MEGA MULTIVITAMIN FOR MEN PO) Take by mouth daily.        . naphazoline (CLEAR EYES) 0.012 % ophthalmic solution Place 1 drop into both eyes as needed. Red eyes       . niacin (SLO-NIACIN) 500 MG tablet Take 500 mg by mouth at bedtime.        [name] PLAQUENIL 200 MG tablet Take 1 tablet by mouth Daily.      [name] rOPINIRole (REQUIP) 1 MG tablet Take 1 mg by mouth at bedtime.        . Tamsulosin HCl (FLOMAX) 0.4 MG CAPS Take by mouth at bedtime.        [name] tiotropium (SPIRIVA HANDIHALER) 18 MCG inhalation capsule Place 18 mcg into inhaler and inhale daily.        . insulin glargine  (LANTUS) 100 UNIT/ML injection Inject 70 Units into the skin at bedtime.  10 mL  5  . doxepin (SINEQUAN) 50 MG capsule Take 50 mg by mouth at bedtime.        . simvastatin (ZOCOR) 20 MG tablet Take 5 mg by mouth at bedtime.       [name] zolpidem (AMBIEN) 5 MG tablet Take 5 mg by mouth at bedtime.        . hydrochlorothiazide 25 MG tablet 1/2 by mouth once daily               Review of Systems  Constitutional: Negative for fever and unexpected weight change.  HENT:Negative for ear pain, nosebleeds, congestion, sore throat, trouble swallowing, dental problem, postnasal drip and sinus pressure.   Eyes: Negative for redness and itching.  Respiratory: Negative hemoptysis .   Cardiovascular: Positive for leg swelling. Negative for palpitations.  Gastrointestinal: Negative for nausea and vomiting.  Genitourinary: Negative for dysuria.  Musculoskeletal: Negative for joint swelling.  Skin: Negative for rash.  Neurological: Negative for headaches.  Hematological: Does not bruise/bleed easily.  Psychiatric/Behavioral: Negative for dysphoric mood. The patient is not nervous/anxious.        Objective:   Physical Exam General:  well developed, well nourished, in no acute distress, obese  Head:  normocephalic and atraumatic Eyes:  PERRLA/EOM intact; conjunctiva and sclera clear Ears:  TMs intact and clear with normal canals Nose:  no deformity, discharge, inflammation, or lesions Mouth:  no deformity or lesions Neck:  no masses, thyromegaly, or abnormal cervical nodes Chest Wall:  no deformities noted Lungs:  decreased BS bilateral and coarse velcro crackles at bases  Heart:  regular rate and rhythm, S1, S2 without murmurs, rubs,  gallops, or clicks, 1+ edema  Abdomen:  bowel sounds positive; abdomen soft and non-tender without masses, or organomegaly Msk:  no deformity or scoliosis noted with normal posture Pulses:  pulses normal Extremities:  no clubbing, cyanosis, or deformity  noted Neurologic:  CN II-XII grossly intact with normal reflexes, coordination, muscle strength and tone Skin:  intact without lesions or rashes Cervical Nodes:  no significant adenopathy Axillary Nodes:  no significant adenopathy Psych:  alert and cooperative; normal mood and affect; normal attention span and concentration        Assessment & Plan:

## 2011-05-04 ENCOUNTER — Encounter (HOSPITAL_BASED_OUTPATIENT_CLINIC_OR_DEPARTMENT_OTHER): Payer: Self-pay | Admitting: *Deleted

## 2011-05-04 ENCOUNTER — Other Ambulatory Visit: Payer: Self-pay

## 2011-05-04 ENCOUNTER — Emergency Department (INDEPENDENT_AMBULATORY_CARE_PROVIDER_SITE_OTHER): Payer: Medicare Other

## 2011-05-04 ENCOUNTER — Inpatient Hospital Stay (HOSPITAL_COMMUNITY)
Admission: EM | Admit: 2011-05-04 | Discharge: 2011-05-08 | DRG: 196 | Disposition: A | Discharge status: Home / Self Care | Payer: Medicare Other | Source: Other Acute Inpatient Hospital | Attending: Pulmonary Disease | Admitting: Pulmonary Disease

## 2011-05-04 ENCOUNTER — Emergency Department (HOSPITAL_BASED_OUTPATIENT_CLINIC_OR_DEPARTMENT_OTHER)
Admission: EM | Admit: 2011-05-04 | Discharge: 2011-05-04 | Disposition: A | Discharge status: Other Acute Inpatient Hospital | Payer: Medicare Other | Source: Home / Self Care | Attending: Emergency Medicine | Admitting: Emergency Medicine

## 2011-05-04 DIAGNOSIS — J449 Chronic obstructive pulmonary disease, unspecified: Secondary | ICD-10-CM

## 2011-05-04 DIAGNOSIS — J189 Pneumonia, unspecified organism: Secondary | ICD-10-CM | POA: Diagnosis present

## 2011-05-04 DIAGNOSIS — Z9981 Dependence on supplemental oxygen: Secondary | ICD-10-CM

## 2011-05-04 DIAGNOSIS — G4733 Obstructive sleep apnea (adult) (pediatric): Secondary | ICD-10-CM | POA: Diagnosis present

## 2011-05-04 DIAGNOSIS — J441 Chronic obstructive pulmonary disease with (acute) exacerbation: Secondary | ICD-10-CM | POA: Insufficient documentation

## 2011-05-04 DIAGNOSIS — Z794 Long term (current) use of insulin: Secondary | ICD-10-CM

## 2011-05-04 DIAGNOSIS — Z79899 Other long term (current) drug therapy: Secondary | ICD-10-CM | POA: Insufficient documentation

## 2011-05-04 DIAGNOSIS — I169 Hypertensive crisis, unspecified: Secondary | ICD-10-CM

## 2011-05-04 DIAGNOSIS — R0602 Shortness of breath: Secondary | ICD-10-CM | POA: Insufficient documentation

## 2011-05-04 DIAGNOSIS — I5032 Chronic diastolic (congestive) heart failure: Secondary | ICD-10-CM | POA: Diagnosis present

## 2011-05-04 DIAGNOSIS — R0989 Other specified symptoms and signs involving the circulatory and respiratory systems: Secondary | ICD-10-CM

## 2011-05-04 DIAGNOSIS — J9620 Acute and chronic respiratory failure, unspecified whether with hypoxia or hypercapnia: Secondary | ICD-10-CM

## 2011-05-04 DIAGNOSIS — I509 Heart failure, unspecified: Secondary | ICD-10-CM

## 2011-05-04 DIAGNOSIS — R918 Other nonspecific abnormal finding of lung field: Secondary | ICD-10-CM

## 2011-05-04 DIAGNOSIS — E039 Hypothyroidism, unspecified: Secondary | ICD-10-CM | POA: Diagnosis present

## 2011-05-04 DIAGNOSIS — N400 Enlarged prostate without lower urinary tract symptoms: Secondary | ICD-10-CM | POA: Diagnosis present

## 2011-05-04 DIAGNOSIS — Z66 Do not resuscitate: Secondary | ICD-10-CM | POA: Diagnosis present

## 2011-05-04 DIAGNOSIS — J8410 Pulmonary fibrosis, unspecified: Principal | ICD-10-CM | POA: Diagnosis present

## 2011-05-04 DIAGNOSIS — J9600 Acute respiratory failure, unspecified whether with hypoxia or hypercapnia: Secondary | ICD-10-CM | POA: Insufficient documentation

## 2011-05-04 DIAGNOSIS — J9690 Respiratory failure, unspecified, unspecified whether with hypoxia or hypercapnia: Secondary | ICD-10-CM

## 2011-05-04 DIAGNOSIS — I2510 Atherosclerotic heart disease of native coronary artery without angina pectoris: Secondary | ICD-10-CM | POA: Insufficient documentation

## 2011-05-04 DIAGNOSIS — E119 Type 2 diabetes mellitus without complications: Secondary | ICD-10-CM | POA: Insufficient documentation

## 2011-05-04 DIAGNOSIS — N189 Chronic kidney disease, unspecified: Secondary | ICD-10-CM | POA: Diagnosis present

## 2011-05-04 DIAGNOSIS — E079 Disorder of thyroid, unspecified: Secondary | ICD-10-CM | POA: Insufficient documentation

## 2011-05-04 DIAGNOSIS — I10 Essential (primary) hypertension: Secondary | ICD-10-CM | POA: Insufficient documentation

## 2011-05-04 LAB — BASIC METABOLIC PANEL
Calcium: 9.2 mg/dL (ref 8.4–10.5)
Creatinine, Ser: 1 mg/dL (ref 0.50–1.35)
GFR calc Af Amer: 87 mL/min — ABNORMAL LOW (ref >90)
GFR calc non Af Amer: 75 mL/min — ABNORMAL LOW (ref >90)

## 2011-05-04 LAB — CBC
Hemoglobin: 13.3 g/dL (ref 13.0–17.0)
MCH: 25.7 pg — ABNORMAL LOW (ref 26.0–34.0)
RBC: 5.17 MIL/uL (ref 4.22–5.81)
WBC: 9.2 10*3/uL (ref 4.0–10.5)

## 2011-05-04 LAB — POCT I-STAT 3, ART BLOOD GAS (G3+)
Acid-Base Excess: 5 mmol/L — ABNORMAL HIGH (ref 0.0–2.0)
Bicarbonate: 36.6 mEq/L — ABNORMAL HIGH (ref 20.0–24.0)
O2 Saturation: 98 %
O2 Saturation: 95 %
Patient temperature: 98.6
TCO2: 39 mmol/L (ref 0–100)
TCO2: 35 mmol/L (ref 0–100)
pCO2 arterial: 78.6 mmHg (ref 35.0–45.0)
pCO2 arterial: 69.5 mmHg (ref 35.0–45.0)
pH, Arterial: 7.217 — ABNORMAL LOW (ref 7.350–7.450)
pH, Arterial: 7.286 — ABNORMAL LOW (ref 7.350–7.450)
pO2, Arterial: 96 mmHg (ref 80.0–100.0)

## 2011-05-04 LAB — DIFFERENTIAL
Basophils Absolute: 0 10*3/uL (ref 0.0–0.1)
Basophils Relative: 0 % (ref 0–1)
Lymphocytes Relative: 28 % (ref 12–46)
Lymphs Abs: 2.6 10*3/uL (ref 0.7–4.0)
Monocytes Relative: 14 % — ABNORMAL HIGH (ref 3–12)
Neutro Abs: 5.1 10*3/uL (ref 1.7–7.7)
Neutrophils Relative %: 56 % (ref 43–77)

## 2011-05-04 LAB — GLUCOSE, CAPILLARY
Glucose-Capillary: 234 mg/dL — ABNORMAL HIGH (ref 70–99)
Glucose-Capillary: 224 mg/dL — ABNORMAL HIGH (ref 70–99)

## 2011-05-04 LAB — TROPONIN I: Troponin I: <0.3 ng/mL (ref <0.30)

## 2011-05-04 MED ORDER — ALBUTEROL SULFATE (5 MG/ML) 0.5% IN NEBU
10.0000 mg | INHALATION_SOLUTION | Freq: Once | RESPIRATORY_TRACT | Status: AC
  Administered 2011-05-04: 10 mg via RESPIRATORY_TRACT

## 2011-05-04 MED ORDER — DEXAMETHASONE SODIUM PHOSPHATE 10 MG/ML IJ SOLN
10.0000 mg | Freq: Once | INTRAMUSCULAR | Status: AC
  Administered 2011-05-04: 10 mg via INTRAVENOUS
  Filled 2011-05-04: qty 1

## 2011-05-04 MED ORDER — IPRATROPIUM BROMIDE 0.02 % IN SOLN
1.0000 mg | Freq: Once | RESPIRATORY_TRACT | Status: AC
  Administered 2011-05-04: 1 mg via RESPIRATORY_TRACT

## 2011-05-04 MED ORDER — FUROSEMIDE 10 MG/ML IJ SOLN
40.0000 mg | Freq: Once | INTRAMUSCULAR | Status: AC
  Administered 2011-05-04: 40 mg via INTRAVENOUS
  Filled 2011-05-04: qty 4

## 2011-05-04 MED ORDER — IPRATROPIUM BROMIDE 0.02 % IN SOLN
RESPIRATORY_TRACT | Status: AC
  Administered 2011-05-04: 1 mg via RESPIRATORY_TRACT
  Filled 2011-05-04: qty 5

## 2011-05-04 MED ORDER — ALBUTEROL SULFATE (5 MG/ML) 0.5% IN NEBU
5.0000 mg | INHALATION_SOLUTION | Freq: Once | RESPIRATORY_TRACT | Status: AC
  Administered 2011-05-04: 5 mg via RESPIRATORY_TRACT

## 2011-05-04 MED ORDER — ALBUTEROL (5 MG/ML) CONTINUOUS INHALATION SOLN
10.0000 mg/h | INHALATION_SOLUTION | RESPIRATORY_TRACT | Status: DC
  Filled 2011-05-04: qty 20

## 2011-05-04 MED ORDER — NITROGLYCERIN 2 % TD OINT
1.0000 [in_us] | TOPICAL_OINTMENT | Freq: Once | TRANSDERMAL | Status: AC
  Administered 2011-05-04: 1 [in_us] via TOPICAL
  Filled 2011-05-04: qty 1

## 2011-05-04 MED ORDER — ALBUTEROL SULFATE (5 MG/ML) 0.5% IN NEBU
INHALATION_SOLUTION | RESPIRATORY_TRACT | Status: AC
  Administered 2011-05-04: 5 mg via RESPIRATORY_TRACT
  Filled 2011-05-04: qty 1

## 2011-05-04 MED ORDER — ALBUTEROL SULFATE (5 MG/ML) 0.5% IN NEBU
INHALATION_SOLUTION | RESPIRATORY_TRACT | Status: AC
  Administered 2011-05-04: 10 mg via RESPIRATORY_TRACT
  Filled 2011-05-04: qty 1.5

## 2011-05-04 MED ORDER — ALBUTEROL SULFATE (5 MG/ML) 0.5% IN NEBU
INHALATION_SOLUTION | RESPIRATORY_TRACT | Status: AC
  Filled 2011-05-04: qty 0.5

## 2011-05-04 MED ORDER — SODIUM CHLORIDE 0.9 % IV BOLUS (SEPSIS)
1000.0000 mL | Freq: Once | INTRAVENOUS | Status: AC
  Administered 2011-05-04: 1000 mL via INTRAVENOUS

## 2011-05-04 MED ORDER — IPRATROPIUM BROMIDE 0.02 % IN SOLN: 1.0000 mg | Freq: Once | RESPIRATORY_TRACT | Status: DC

## 2011-05-04 NOTE — ED Provider Notes (Signed)
History     CSN: [id number] Arrival date & time: 05/04/2011  4:25 AM  Chief Complaint  Patient presents with  . Shortness of Breath    (Consider location/radiation/quality/duration/timing/severity/associated sxs/prior treatment) HPI Comments: The patient presents in severe respiratory distress, his condition limiting his ability to give a history. He reports that he has had progressive shortness of breath over the last 2 days. He denies any cough or fever or chills. He denies any chest pain. He has no apparent lower extremity edema. He does exhibit cyanosis. He has a history of COPD and is on 3 L of oxygen at all times by nasal cannula. He is exhibiting tripod positioning, accessory muscle usage, and pursed lip breathing. He is awake, alert, and oriented to person, place, time, and event. Continuous bronchodilator nebulizer treatment was initiated on initial valuation of the patient. His ABG indicates significant CO2 retention, greater than 90 mmHg. He was placed on BiPAP initially in order to try to assist his ventilation and clearance of CO2. When I addressed the possibility of intubation with the patient, he very clearly and adamantly states that he refuses intubation even if it is needed to save his life. In his own words "I would rather die than be put on a ventilator again". I'm hoping at this time that we will not need that measure, but the patient has made it clear while he is coherent and competent but this is something that he absolutely does not want.  Patient is a 70 y.o. male presenting with shortness of breath. The history is provided by the patient and medical records. The history is limited by the condition of the patient.  Shortness of Breath  The current episode started 2 days ago. The onset was gradual. The problem occurs continuously. The problem has been gradually worsening. The problem is severe. The symptoms are relieved by nothing (The patient has not received any relief from  rest, or supplemental oxygen at home.). The symptoms are aggravated by activity. Associated symptoms include shortness of breath and wheezing. Pertinent negatives include no chest pain, no chest pressure, no orthopnea, no fever, no rhinorrhea, no sore throat, no stridor and no cough. There was no intake of a foreign body. He was not exposed to toxic fumes. He has not inhaled smoke recently. He has had prior hospitalizations. He has had prior ICU admissions. He has had prior intubations. His past medical history is significant for past wheezing. 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 He has been behaving normally. Urine output has been normal. There were no sick contacts. He has received no recent medical care.    Past Medical History  Diagnosis Date  . THRUSH 11/06/2009  . HYPOTHYROIDISM 07/30/2009  . DIABETES MELLITUS, TYPE II 11/23/2009  . DEPRESSION 11/23/2009  . 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 HYPERTENSION 07/30/2009  . CORONARY ARTERY DISEASE 11/23/2009  . CHRONIC OBSTRUCTIVE PULMONARY DISEASE, ACUTE EXACERBATION 03/01/2010  . C O P D 07/30/2009  . 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 RESPIRATORY FAILURE, CHRONIC 07/31/2009  . BENIGN PROSTATIC HYPERTROPHY 11/23/2009  . DEGENERATIVE JOINT DISEASE 11/23/2009  . 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 FATIGUE 11/23/2009  . 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 GAIT DISTURBANCE 12/10/2009  . DYSPNEA/SHORTNESS OF BREATH 12/08/2009  . HEMOPTYSIS UNSPECIFIED 05/07/2010    Past Surgical History  Procedure Date  . Small bowel obstruction repair with adhesiolysis   . Hx of remote ileum resection due to bleeding   . Abdominal aortic aneurysm  repair   . S/p back surgury  1972    lumbar laminectomy Dr. Fannie Knee  . S/p left femoral embolectomy with left leg ischemia     Dr. Hart [place], vascular  . S/p right ganglion cyst     Dr. Teressa Senter    No family history on file.  History  Substance Use Topics  . Smoking status: Former Smoker -- 2.5  packs/day for 40 years    Types: Cigarettes, Pipe, Cigars    Quit date: 07/28/1998  . Smokeless tobacco: Never Used   Comment: quit in 2000 x 40 yrs. 2.5 ppd  . Alcohol Use: No      Review of Systems  Unable to perform ROS Constitutional: Negative for fever.  HENT: Negative for sore throat and rhinorrhea.   Respiratory: Positive for shortness of breath and wheezing. Negative for cough and stridor.   Cardiovascular: Negative for chest pain and orthopnea.    Allergies  Review of patient's allergies indicates no known allergies.  Home Medications   Current Outpatient Rx  Name Route Sig Dispense Refill  . ALBUTEROL SULFATE HFA 108 (90 BASE) MCG/ACT IN AERS Inhalation Inhale 2 puffs into the lungs as needed. Shortness of breath    . ASPIRIN 81 MG PO TABS Oral Take 81 mg by mouth daily.      . BD INSULIN SYRINGE ULTRAFINE 30G X 1/2" 1 ML MISC      . VITAMIN D3 2000 UNITS PO TABS Oral Take 1 tablet by mouth daily.     [name] DEPO-TESTOSTERONE 200 MG/ML IM OIL Intramuscular Inject 200 mg into the muscle every 14 (fourteen) days.    [name] DOXEPIN HCL 10 MG PO CAPS Oral Take 10 mg by mouth 3 (three) times daily.     [name] DOXEPIN HCL 50 MG PO CAPS Oral Take 50 mg by mouth at bedtime.      [name] DOXYCYCLINE HYCLATE 100 MG PO TABS Oral Take 1 tablet (100 mg total) by mouth 2 (two) times daily. 14 tablet 0  . FINASTERIDE 5 MG PO TABS Oral Take 5 mg by mouth daily.      [name] FORMOTEROL FUMARATE 12 MCG IN CAPS  Inhale contents of 1 capsule two times a day     . FUROSEMIDE 40 MG PO TABS Oral Take 1 tablet (40 mg total) by mouth daily. 90 tablet 3  . GABAPENTIN 800 MG PO TABS Oral Take 800 mg by mouth 2 (two) times daily.      . INSULIN ASPART 100 UNIT/ML [place] SOLN  10-20 Units 3 (three) times daily before meals. Sliding scale as needed with meals (14 units in the morning 12 units at lunch and 24 units at dinner)    . INSULIN GLARGINE 100 UNIT/ML  SOLN Subcutaneous Inject 86 Units into the skin at bedtime.     [name]  LEVOTHYROXINE SODIUM 25 MCG PO TABS Oral Take 1 tablet (25 mcg total) by mouth daily. 90 tablet 3    Take in addition to second rx for synthroid as wel ...  . LEVOTHYROXINE SODIUM 200 MCG PO TABS  1 tablet by mouth every morning and every evening     . LOSARTAN POTASSIUM 100 MG PO TABS  1/2 by mouth once daily     . METFORMIN HCL 1000 MG PO TABS Oral Take 1,000 mg by mouth 2 (two) times daily.      . MOMETASONE FUROATE 220 MCG/INH IN AEPB Inhalation Inhale 2 puffs into the lungs daily.      [name]  MEGA MULTIVITAMIN FOR MEN PO Oral Take by mouth daily.      [name] NAPHAZOLINE HCL 0.012 % OP SOLN Both Eyes Place 1 drop into both eyes as needed. Red eyes     . NIACIN CR 500 MG PO TBCR Oral Take 500 mg by mouth at bedtime.      [name] PLAQUENIL 200 MG PO TABS Oral Take 1 tablet by mouth Daily.    [name] ROPINIROLE HCL 1 MG PO TABS Oral Take 1 mg by mouth at bedtime.      [name] SIMVASTATIN 20 MG PO TABS Oral Take 5 mg by mouth at bedtime.     . TAMSULOSIN HCL 0.4 MG PO CAPS Oral Take by mouth at bedtime.      [name] TIOTROPIUM BROMIDE MONOHYDRATE 18 MCG IN CAPS Inhalation Place 18 mcg into inhaler and inhale daily.      [name] VIAGRA 100 MG PO TABS  prn      BP 187/73  Pulse 101  Resp 25  SpO2 94%  Physical Exam  Nursing note and vitals reviewed. Constitutional: He is oriented to person, place, and time. He appears well-nourished. He appears distressed.       Cyanotic at the distal extremities  HENT:  Head: Normocephalic and atraumatic.  Nose: Nose normal.  Mouth/Throat: Oropharynx is clear and moist. No oropharyngeal exudate.  Eyes: Conjunctivae and EOM are normal. Pupils are equal, round, and reactive to light.  Neck: Normal range of motion. Neck supple. No JVD present. No tracheal deviation present.  Cardiovascular: Regular rhythm, S1 normal, S2 normal, intact distal pulses and normal pulses.   No extrasystoles are present. Tachycardia present.  PMI is not displaced.  Exam reveals distant heart sounds. Exam reveals no  gallop and no friction rub.   No murmur heard. Pulmonary/Chest: Accessory muscle usage present. No stridor. Tachypnea noted. He is in respiratory distress. He has decreased breath sounds in the right upper field, the right middle field, the right lower field, the left upper field, the left middle field and the left lower field. He has wheezes in the right middle field and the left middle field. He has no rhonchi. He has no rales. He exhibits retraction. He exhibits no tenderness, no bony tenderness, no crepitus, no edema and no deformity.  Abdominal: Soft. He exhibits no distension. There is no tenderness. There is no rebound and no guarding.  Musculoskeletal: Normal range of motion. He exhibits no edema and no tenderness.  Lymphadenopathy:    He has no cervical adenopathy.  Neurological: He is alert and oriented to person, place, and time. No cranial nerve deficit. Coordination normal.  Skin: Skin is warm and dry. No rash noted. He is not diaphoretic. No erythema. There is pallor.  Psychiatric: He has a normal mood and affect. His behavior is normal. Judgment and thought content normal.    ED Course  CRITICAL CARE Performed by: Kennon Rounds D Authorized by: Kennon Rounds D Total critical care time: 45 minutes Critical care time was exclusive of separately billable procedures and treating other patients. Critical care was necessary to treat or prevent imminent or life-threatening deterioration of the following conditions: respiratory failure. Critical care was time spent personally by me on the following activities: evaluation of patient's response to treatment, examination of patient, obtaining history from patient or surrogate, ordering and performing treatments and interventions, ordering and review of laboratory studies, ordering and review of radiographic studies, pulse oximetry, re-evaluation of patient's condition and review of old charts.   (  including critical care time)  Labs  Reviewed  CBC - Abnormal; Notable for the following:    MCH 25.7 (*)    Platelets 144 (*)    All other components within normal limits  DIFFERENTIAL - Abnormal; Notable for the following:    Monocytes Relative 14 (*)    Monocytes Absolute 1.3 (*)    All other components within normal limits  POCT I-STAT 3, BLOOD GAS (G3+) - Abnormal; Notable for the following:    pH, Arterial 7.217 (*)    pCO2 arterial 90.1 (*)    pO2, Arterial 136.0 (*)    Bicarbonate 36.6 (*)    Acid-Base Excess 5.0 (*)    All other components within normal limits  BASIC METABOLIC PANEL  TROPONIN I  PROTIME-INR  APTT  BLOOD GAS, ARTERIAL   No results found.   No diagnosis found.   Date: 05/04/2011  Rate: 97  Rhythm: normal sinus rhythm  QRS Axis: left  Intervals: QRS prolonged in the left bundle-branch block  ST/T Wave abnormalities: nonspecific ST/T changes  Conduction Disutrbances:left bundle branch block  Narrative Interpretation: EKG showing left bundle branch block unchanged from prior EKG  Old EKG Reviewed: unchanged    MDM  COPD exacerbation, pneumonia, congestive heart failure, myocardial infarction, anemia, arrhythmia are all considerations in the differential diagnosis amongst other possible etiologies. Given the patient's history of COPD, and the gradual progression of symptoms, as well as his appearance on evaluation, I favor a COPD exacerbation as the cause of his respiratory distress. Currently the patient is on BiPAP and tolerating this well with improved lung aeration heard on auscultation and improved oxygen saturation. He is receiving in line bronchodilator nebulizer as a continuous treatment. His initial blood gas shows significant CO2 retention, however it appears that he likely has a baseline component of CO2 retention. We will reevaluate the ABG after 30 minutes of BiPAP to see if we've made improvement. This patient appears as though he will need admission for his current  condition.        Felisa Bonier, MD 05/04/11 [phone]

## 2011-05-04 NOTE — ED Notes (Signed)
Patient is having shortness of breath for the past few days. Patient is on a nasal canula at 3L

## 2011-05-04 NOTE — ED Notes (Signed)
Pt has marked improvement of respiratory status from when he initially arrived.  Upon arrival, pt was severely diminished in all bases very little air movement was heard; inspiratory and expiratory wheezing throughout.  With continuous neb tx over 1 hr pt showed improvement, most changed was in R lung.  After admin of 2nd treatment pt continues to show improvement, pt remains diminished in the bases bilaterally, expiratory wheezing heard bilaterally as well.

## 2011-05-04 NOTE — ED Notes (Signed)
Wife, August Saucer, contacted at [phone] about patient's status and transfer to the hospital.

## 2011-05-05 ENCOUNTER — Telehealth: Payer: Self-pay | Admitting: Internal Medicine

## 2011-05-05 ENCOUNTER — Inpatient Hospital Stay (HOSPITAL_COMMUNITY): Payer: Medicare Other

## 2011-05-05 LAB — CBC
MCH: 25.7 pg — ABNORMAL LOW (ref 26.0–34.0)
Platelets: 130 10*3/uL — ABNORMAL LOW (ref 150–400)
RBC: 4.48 MIL/uL (ref 4.22–5.81)
WBC: 9.1 10*3/uL (ref 4.0–10.5)

## 2011-05-05 LAB — GLUCOSE, CAPILLARY
Glucose-Capillary: 330 mg/dL — ABNORMAL HIGH (ref 70–99)
Glucose-Capillary: 179 mg/dL — ABNORMAL HIGH (ref 70–99)
Glucose-Capillary: 204 mg/dL — ABNORMAL HIGH (ref 70–99)
Glucose-Capillary: 353 mg/dL — ABNORMAL HIGH (ref 70–99)

## 2011-05-05 LAB — BASIC METABOLIC PANEL
CO2: 32 mEq/L (ref 19–32)
Calcium: 8.6 mg/dL (ref 8.4–10.5)
Chloride: 97 mEq/L (ref 96–112)
GFR calc Af Amer: 85 mL/min — ABNORMAL LOW (ref >90)
Sodium: 137 mEq/L (ref 135–145)

## 2011-05-05 NOTE — Telephone Encounter (Signed)
[email or web address]

## 2011-05-06 ENCOUNTER — Inpatient Hospital Stay (HOSPITAL_COMMUNITY): Payer: Medicare Other

## 2011-05-06 DIAGNOSIS — J189 Pneumonia, unspecified organism: Secondary | ICD-10-CM

## 2011-05-06 DIAGNOSIS — I509 Heart failure, unspecified: Secondary | ICD-10-CM

## 2011-05-06 DIAGNOSIS — J9620 Acute and chronic respiratory failure, unspecified whether with hypoxia or hypercapnia: Secondary | ICD-10-CM

## 2011-05-06 DIAGNOSIS — J441 Chronic obstructive pulmonary disease with (acute) exacerbation: Secondary | ICD-10-CM

## 2011-05-06 LAB — BASIC METABOLIC PANEL
BUN: 26 mg/dL — ABNORMAL HIGH (ref 6–23)
Chloride: 96 mEq/L (ref 96–112)
GFR calc Af Amer: 80 mL/min — ABNORMAL LOW (ref >90)
Potassium: 4.2 mEq/L (ref 3.5–5.1)
Sodium: 139 mEq/L (ref 135–145)

## 2011-05-06 LAB — GLUCOSE, CAPILLARY
Glucose-Capillary: 110 mg/dL — ABNORMAL HIGH (ref 70–99)
Glucose-Capillary: 202 mg/dL — ABNORMAL HIGH (ref 70–99)
Glucose-Capillary: 256 mg/dL — ABNORMAL HIGH (ref 70–99)

## 2011-05-06 LAB — CBC
MCV: 84.4 fL (ref 78.0–100.0)
Platelets: 140 10*3/uL — ABNORMAL LOW (ref 150–400)
RBC: 4.95 MIL/uL (ref 4.22–5.81)
RDW: 14.5 % (ref 11.5–15.5)
WBC: 11.3 10*3/uL — ABNORMAL HIGH (ref 4.0–10.5)

## 2011-05-06 LAB — MAGNESIUM: Magnesium: 1.9 mg/dL (ref 1.5–2.5)

## 2011-05-06 LAB — PHOSPHORUS: Phosphorus: 4 mg/dL (ref 2.3–4.6)

## 2011-05-06 NOTE — Telephone Encounter (Signed)
[email or web address]  

## 2011-05-07 LAB — BASIC METABOLIC PANEL
CO2: 35 mEq/L — ABNORMAL HIGH (ref 19–32)
Calcium: 9.7 mg/dL (ref 8.4–10.5)
GFR calc non Af Amer: 71 mL/min — ABNORMAL LOW (ref >90)
Glucose, Bld: 137 mg/dL — ABNORMAL HIGH (ref 70–99)
Potassium: 3.7 mEq/L (ref 3.5–5.1)
Sodium: 142 mEq/L (ref 135–145)

## 2011-05-07 LAB — GLUCOSE, CAPILLARY
Glucose-Capillary: 115 mg/dL — ABNORMAL HIGH (ref 70–99)
Glucose-Capillary: 177 mg/dL — ABNORMAL HIGH (ref 70–99)

## 2011-05-07 LAB — CBC
Hemoglobin: 13 g/dL (ref 13.0–17.0)
Platelets: 132 10*3/uL — ABNORMAL LOW (ref 150–400)
RBC: 5.09 MIL/uL (ref 4.22–5.81)

## 2011-05-07 LAB — PHOSPHORUS: Phosphorus: 4.2 mg/dL (ref 2.3–4.6)

## 2011-05-07 LAB — MAGNESIUM: Magnesium: 1.9 mg/dL (ref 1.5–2.5)

## 2011-05-07 NOTE — Telephone Encounter (Signed)
Called, spoke with pt's wife.  States pt is in [facility] - currently in [place].  She was calling to request for a pulm dr to visit pt while he was in the hospital but states, per pt, someone with Pulmonary has already been in to see him.  Advised I will send message to MR as FYI that pt is in the hospital.  She is ok with this and nothing further needed at this time.  Will forward to MR as FYI only.

## 2011-05-08 ENCOUNTER — Telehealth: Payer: Self-pay | Admitting: Adult Health

## 2011-05-08 DIAGNOSIS — J449 Chronic obstructive pulmonary disease, unspecified: Secondary | ICD-10-CM

## 2011-05-08 LAB — CBC
Platelets: 131 10*3/uL — ABNORMAL LOW (ref 150–400)
RDW: 14.4 % (ref 11.5–15.5)
WBC: 9.5 10*3/uL (ref 4.0–10.5)

## 2011-05-08 LAB — BASIC METABOLIC PANEL
Chloride: 94 mEq/L — ABNORMAL LOW (ref 96–112)
GFR calc Af Amer: 59 mL/min — ABNORMAL LOW (ref >90)
Potassium: 4.4 mEq/L (ref 3.5–5.1)
Sodium: 139 mEq/L (ref 135–145)

## 2011-05-08 LAB — MAGNESIUM: Magnesium: 2 mg/dL (ref 1.5–2.5)

## 2011-05-08 NOTE — Telephone Encounter (Signed)
Order placed for stat BMET to be done. Spoke with pt's spouse and advised come in approx 30 min prior to 9 am ov with TP to have labs drawn. She verbalized understanding.

## 2011-05-08 NOTE — Telephone Encounter (Signed)
Katy's pager is [phone].Raylene Everts

## 2011-05-09 ENCOUNTER — Encounter: Payer: Self-pay | Admitting: Adult Health

## 2011-05-09 ENCOUNTER — Ambulatory Visit (INDEPENDENT_AMBULATORY_CARE_PROVIDER_SITE_OTHER): Payer: Medicare Other | Admitting: Adult Health

## 2011-05-09 ENCOUNTER — Other Ambulatory Visit (INDEPENDENT_AMBULATORY_CARE_PROVIDER_SITE_OTHER): Payer: Medicare Other

## 2011-05-09 VITALS — BP 142/72 | HR 79 | Temp 97.3°F | Ht 74.0 in | Wt 253.6 lb

## 2011-05-09 DIAGNOSIS — J9610 Chronic respiratory failure, unspecified whether with hypoxia or hypercapnia: Secondary | ICD-10-CM

## 2011-05-09 DIAGNOSIS — J449 Chronic obstructive pulmonary disease, unspecified: Secondary | ICD-10-CM

## 2011-05-09 DIAGNOSIS — G4733 Obstructive sleep apnea (adult) (pediatric): Secondary | ICD-10-CM

## 2011-05-09 LAB — BASIC METABOLIC PANEL
BUN: 30 mg/dL — ABNORMAL HIGH (ref 6–23)
GFR: 68.25 mL/min (ref >60.00)
Potassium: 4.3 mEq/L (ref 3.5–5.1)
Sodium: 142 mEq/L (ref 135–145)

## 2011-05-09 LAB — GLUCOSE, CAPILLARY: Glucose-Capillary: 176 mg/dL — ABNORMAL HIGH (ref 70–99)

## 2011-05-09 MED ORDER — PREDNISONE 10 MG PO TABS: ORAL_TABLET | ORAL | Status: AC

## 2011-05-09 NOTE — Assessment & Plan Note (Addendum)
Recent exacerbation secondary ILD flare, CHF vol overload, and possible CAP He was tx w/ initial BIPAP w/ transition to nocturnal BIPAP for suspected OSA w/  Associated hypercarbia. Along w/ pulmonary hygiene, IV abx and steroids. HE was aggressively  Diuresised w/ improvement in vol overload bmet today shows improved renal insufficiency  Set up for sleep study to evaluate for OSA w/ referral for sleep consult  Will resume diuretics.   Plan;  Taper Prednisone over next week.  Restart Lasix 40mg  daily  Finish Levaquin (antibiotic ) as directed.  Wear BIPAP at night.  We are setting you up for sleep study.  After sleep study , we are setting you up for a consult with Dr. Vassie Loll at [facility]  follow up Dr. Marchelle Gearing in 2 weeks as planned

## 2011-05-09 NOTE — Progress Notes (Deleted)
  Subjective:    Patient ID: [name], male    DOB: 02-04-1941, 70 y.o.   MRN: [id number]  HPI    Review of Systems     Objective:   Physical Exam        Assessment & Plan:

## 2011-05-09 NOTE — Patient Instructions (Signed)
Taper Prednisone over next week.  Restart Lasix 40mg  daily  Finish Levaquin (antibiotic ) as directed.  Wear BIPAP at night.  We are setting you up for sleep study.  After sleep study , we are setting you up for a consult with Dr. Vassie Loll at [facility]  follow up Dr. Marchelle Gearing in 2 weeks as planned

## 2011-05-09 NOTE — Progress Notes (Signed)
Subjective:    Patient ID: [name], male    DOB: [birth year], 70 y.o.   MRN: [id number]  HPI History of Present Illness: 42  yowm quit smoking around 2000 with GOLD III COPD by PFT's 09/2009. Meds through [facility]  June 18, 2010: EX 100 pack year smoker. Home O2 dependent Gold stage 3 COPD with low DLCO 35%  in PFTs MArch 2011. BAeline pulse ox is 87-93% on 2L [place]. In,  OCt 2011 Ct showing some UIP component but predmoninant COPD on CT. 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 Sherene Sires to myself.  In Oct 2011 had some mild AECOPD related hemoptysis with CT showing only UIP but not showing any mass. Since then hemoptysis resolved. Currently feels baseline on 2-3L [place]. Claudication lmits him more than dyspnea at 1/4 th mile. Denies chronic cough but on mucinex and feels it helps. On spiriva, foradil and asmanex through [facility]. Has not done rehab though wife is attending rehab. Denies chest pains, edema, wheezing, fever, colored sputum. REC: CHECK AUTOIMMUNE PROFILE,  START NAC, GET FULL PFTs   August 12, 2010: Followup. in interim was admitted for sBP12/19/2011 due to [facility] from prior laparotomies. Resolved with conservative Rx. Started pulmnary rehab 2 weeks ago. Attend 4 sesssions. It is helping so far and he likes it. No new complaints. Dyspnea is stable class 2-3. Denies associated cough, chest pain, hemoptysis, edema, sputum, weight loss. Had autoimmune profile 07/03/2010 - CRP 10, RF 352 but otherwise negative. Upon revealing labs he states he is known to have RA diagnosed over 10 years ago and was on enbrel. He is due to see a rheumatologist at [place], [place]. PFTs today continue to show Gold stage 3 COPD and somewhat improved from a year ago.  August 27, 2010 --Presents for an acute office visit.Complains of increased sob,  dry cough, wheezing. Pt states he doesn't feel like he is getting enough oxygen -cant get  enough air. Retaining more fluid. Denies chest pain, orthopnea, hemoptysis, fever, n/v/d,  headache,recent travel. RX: DOXY AND PRED   OV 02/21/2011: Admitted 7/22 through 7/26 for Acute-on-chronic hypoxemia, probably with chronic obstructive       pulmonary disease exacerbation, pneumonia, acute congestive heart      Failure, (presented with dyspnea, edema, cough). est x-ray on July 22 yield chronic lung changes without acute      overlying pulmonary process.  A 2-D echo yields an ejection fraction of 55% to 60% with grade 2      diastolic dysfunction.  Mitral valve with moderately calcified      Annulus. ( I personally reviewed thos results)  Moderately thickened leaflets. RX: DIURESIS, PRED, AVELOX and O2   Today  Followup from above hospitalization. States he no longer has worsening dyspnea, edema, sputum, cough, fever, chills. Back to baseline o2 need of 3L [place]. However, feels fatigued today more than yesterday. Feels is due to benadryl which he is taking for insomnia. Was talking Remus Loffler but he was falling as a result and stopped. He has class 3 dyspnea at baseline. Has completed rehab.  Compliant with meds>>no changes  03/20/11  Acute OV  Complains of persistent leg swelling. Increased DOE with actiivity . Wears out easily. Lasix has been increased to 40mg  daily with limitedresponse. Weight trending up , now up 10 lbs over last month. Does have cards consult next week. Previous echo last month during hospital admit showed LV nml w/ grade 2  diastolic dysfnx. No chest pain , increaesed cough or congestion  >>Aldactone 25mg  Twice daily  rx   04/29/2011 Follow up  Pt returns for follow up. Last ov with increased leg swelling despite lasix 40mg . Aldactone 25mg  Twice daily was added to his regimen. He says this did help some however was taken off per cardiology recommendations. He says leg swelling is about the same.  Has gotten some TEDS stocking to wear daily .   Does complain of increased congestion for  last few days with green mucus. Wife was recently tx for bronchitis. NO fever or hemoptysis. No recent abx use.  Taking spiriva , foradil and asmanex without known difficulty-gets meds via [place] system.  >>  05/09/2011 Post hospital  Pt returns for close follow up from hospitalization. Admitted 10/7-10/11 for acute on chronic resp failure w/ ILD flare, CHF w/ vol overload, and possible CAP along w/ supsected OSA /hypercarbia.  He was tx w/ initial BIPAP w/ transition to nocturnal BIPAP for suspected OSA w/  Associated hypercarbia. Along w/ pulmonary hygiene, IV abx and steroids. HE was aggressively  Diuresised w/ improvement in vol overload. Today bmet  shows improved renal insufficiency .  XRAy showed increased consolidation predominately in bases.  He returns today improved w/ weight loss of >10lbs w/ substantial decreased in edema.  Feels his breathing is back to his baseline.  No wheezing or hemoptysis. No chest pain or fever.        Past Medical History  Diagnosis Date  . THRUSH 11/06/2009  . HYPOTHYROIDISM 07/30/2009  . DIABETES MELLITUS, TYPE II 11/23/2009  . DEPRESSION 11/23/2009  . 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 HYPERTENSION 07/30/2009  . CORONARY ARTERY DISEASE 11/23/2009  . CHRONIC OBSTRUCTIVE PULMONARY DISEASE, ACUTE EXACERBATION 03/01/2010  . C O P D 07/30/2009  . 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 RESPIRATORY FAILURE, CHRONIC 07/31/2009  . BENIGN PROSTATIC HYPERTROPHY 11/23/2009  . DEGENERATIVE JOINT DISEASE 11/23/2009  . 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 FATIGUE 11/23/2009  . 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 GAIT DISTURBANCE 12/10/2009  . DYSPNEA/SHORTNESS OF BREATH 12/08/2009  . HEMOPTYSIS UNSPECIFIED 05/07/2010     No family history on file.   History   Social History  . Marital Status: Married    Spouse Name: N/A    Number of Children: N/A  . Years of Education: N/A   Occupational History  . disabled veteran, Ex Human resources officer    Social History Main Topics  .  Smoking status: Former Smoker -- 2.5 packs/day for 40 years    Types: Cigarettes, Pipe, Cigars    Quit date: 07/28/1998  . Smokeless tobacco: Never Used   Comment: quit in 2000 x 40 yrs. 2.5 ppd  . Alcohol Use: No  . Drug Use: No  . Sexually Active: Not on file   Other Topics Concern  . Not on file   Social History Narrative   Lives in the home with [facility].Denies asbestos exposure     No Known Allergies   Outpatient Prescriptions Prior to Visit  Medication Sig Dispense Refill  . albuterol (PROAIR HFA) 108 (90 BASE) MCG/ACT inhaler Inhale 2 puffs into the lungs as needed. Shortness of breath      . aspirin 81 MG tablet Take 81 mg by mouth daily.        . B-D INS SYR ULTRAFINE 1CC/30G 30G X 1/2" 1 ML MISC       . Cholecalciferol (VITAMIN D3) 2000 UNITS  TABS Take 1 tablet by mouth daily.       [name] DEPO-TESTOSTERONE 200 MG/ML injection Inject 200 mg into the muscle every 14 (fourteen) days.      [name] doxepin (SINEQUAN) 10 MG capsule Take 10 mg by mouth 3 (three) times daily.       . finasteride (PROSCAR) 5 MG tablet Take 5 mg by mouth daily.        . formoterol (FORADIL AEROLIZER) 12 MCG capsule for inhaler Inhale contents of 1 capsule two times a day       . furosemide (LASIX) 40 MG tablet Take 1 tablet (40 mg total) by mouth daily.  90 tablet  3  . gabapentin (NEURONTIN) 800 MG tablet Take 800 mg by mouth 2 (two) times daily.        . insulin aspart (NOVOLOG) 100 UNIT/ML injection 10-20 Units 3 (three) times daily before meals. Sliding scale as needed with meals (12 units in the morning 10 units at lunch and 20 units at dinner)      . levothyroxine (LEVOTHROID) 25 MCG tablet Take 1 tablet (25 mcg total) by mouth daily.  90 tablet  3  . levothyroxine (SYNTHROID, LEVOTHROID) 200 MCG tablet 1 tablet by mouth every morning and every evening       . losartan (COZAAR) 100 MG tablet 1/2 by mouth once daily       . metFORMIN (GLUCOPHAGE) 1000 MG tablet Take 1,000 mg  by mouth 2 (two) times daily.        . mometasone (ASMANEX 60 METERED DOSES) 220 MCG/INH inhaler Inhale 2 puffs into the lungs daily.        . Multiple Vitamins-Minerals (MEGA MULTIVITAMIN FOR MEN PO) Take by mouth daily.        . naphazoline (CLEAR EYES) 0.012 % ophthalmic solution Place 1 drop into both eyes as needed. Red eyes       . niacin (SLO-NIACIN) 500 MG tablet Take 500 mg by mouth at bedtime.        [name] PLAQUENIL 200 MG tablet Take 1 tablet by mouth Daily.      [name] rOPINIRole (REQUIP) 1 MG tablet Take 1 mg by mouth at bedtime.        . Tamsulosin HCl (FLOMAX) 0.4 MG CAPS Take by mouth at bedtime.        [name] tiotropium (SPIRIVA HANDIHALER) 18 MCG inhalation capsule Place 18 mcg into inhaler and inhale daily.        . insulin glargine (LANTUS) 100 UNIT/ML injection Inject 70 Units into the skin at bedtime.  10 mL  5  . doxepin (SINEQUAN) 50 MG capsule Take 50 mg by mouth at bedtime.        . simvastatin (ZOCOR) 20 MG tablet Take 5 mg by mouth at bedtime.       [name] zolpidem (AMBIEN) 5 MG tablet Take 5 mg by mouth at bedtime.        . hydrochlorothiazide 25 MG tablet 1/2 by mouth once daily               Review of Systems  Constitutional: Negative for fever and unexpected weight change.  HENT:Negative for ear pain, nosebleeds, congestion, sore throat, trouble swallowing, dental problem, postnasal drip and sinus pressure.   Eyes: Negative for redness and itching.  Respiratory: Negative hemoptysis .   Cardiovascula Negative for palpitations.  Gastrointestinal: Negative for nausea and vomiting.  Genitourinary: Negative for dysuria.  Musculoskeletal: Negative for joint swelling.  Skin:  Negative for rash.  Neurological: Negative for headaches.  Hematological: Does not bruise/bleed easily.  Psychiatric/Behavioral: Negative for dysphoric mood. The patient is not nervous/anxious.        Objective:   Physical Exam General:  well developed, well nourished, in no acute distress, obese    Head:  normocephalic and atraumatic Eyes:  PERRLA/EOM intact; conjunctiva and sclera clear Ears:  TMs intact and clear with normal canals Nose:  no deformity, discharge, inflammation, or lesions Mouth:  no deformity or lesions Neck:  no masses, thyromegaly, or abnormal cervical nodes Chest Wall:  no deformities noted Lungs:  decreased BS bilateral and coarse velcro crackles at bases  Heart:  regular rate and rhythm, S1, S2 without murmurs, rubs, gallops, or clicks, tr edema  Abdomen:  bowel sounds positive; abdomen soft and non-tender without masses, or organomegaly Msk:  no deformity or scoliosis noted with normal posture Pulses:  pulses normal Extremities:  no clubbing, cyanosis, or deformity noted Neurologic:  CN II-XII grossly intact with normal reflexes, coordination, muscle strength and tone Skin:  intact without lesions or rashes Cervical Nodes:  no significant adenopathy Axillary Nodes:  no significant adenopathy Psych:  alert and cooperative; normal mood and affect; normal attention span and concentration        Assessment & Plan:

## 2011-05-12 ENCOUNTER — Ambulatory Visit (HOSPITAL_BASED_OUTPATIENT_CLINIC_OR_DEPARTMENT_OTHER): Payer: Medicare Other | Attending: Internal Medicine

## 2011-05-12 DIAGNOSIS — G4733 Obstructive sleep apnea (adult) (pediatric): Secondary | ICD-10-CM | POA: Insufficient documentation

## 2011-05-12 DIAGNOSIS — Z77090 Contact with and (suspected) exposure to asbestos: Secondary | ICD-10-CM | POA: Insufficient documentation

## 2011-05-12 DIAGNOSIS — J449 Chronic obstructive pulmonary disease, unspecified: Secondary | ICD-10-CM | POA: Insufficient documentation

## 2011-05-12 DIAGNOSIS — J4489 Other specified chronic obstructive pulmonary disease: Secondary | ICD-10-CM | POA: Insufficient documentation

## 2011-05-12 DIAGNOSIS — G4734 Idiopathic sleep related nonobstructive alveolar hypoventilation: Secondary | ICD-10-CM | POA: Insufficient documentation

## 2011-05-12 DIAGNOSIS — Z9981 Dependence on supplemental oxygen: Secondary | ICD-10-CM | POA: Insufficient documentation

## 2011-05-12 DIAGNOSIS — Z79899 Other long term (current) drug therapy: Secondary | ICD-10-CM | POA: Insufficient documentation

## 2011-05-21 NOTE — Discharge Summary (Signed)
[name], [name]            ACCOUNT NO.:  [id number]  MEDICAL RECORD NO.:  [id number]  LOCATION:  3304                         FACILITY:  [facility]  PHYSICIAN:  Felipa Evener, MD  DATE OF BIRTH:  [birth year]  DATE OF ADMISSION:  05/04/2011 DATE OF DISCHARGE:  05/08/2011                              DISCHARGE SUMMARY   DISCHARGE DIAGNOSES: 1. Acute on chronic respiratory failure. 2. Interstitial lung disease. 3. Chronic obstructive pulmonary disease. 4. Congestive heart failure. 5. Community-acquired pneumonia. 6. Diabetes. 7. Chronic renal insufficiency.  HISTORY OF PRESENT ILLNESS:  [name] is a 70 year old male with known history of GOLD stage III COPD, interstitial lung disease, grade 2 diastolic dysfunction, and chronic renal failure, who was on home O2, who presented on May 04, 2011, with complaints of 5-day history of progressive shortness of breath, productive cough, and increased bilateral lower extremity edema.  Initially placed on BiPAP in the emergency room and Pulmonary Critical Care was called to admit the patient.  Of note, he did have significant CO2 retention with CO2 of greater than 90 on initial ABG and the patient was placed on BiPAP; however, the patient was adamant about no intubation and the patient has been made DNR/DNI.  LABORATORY DATA:  At the time of admission, May 04, 2011, CBC:  White blood cells 9.2, hemoglobin 13.3, hematocrit 44.0, platelets 144.  PT 12.8, INR 0.94.  Basic metabolic panel:  Sodium 142, potassium 3.8, glucose 112, BUN 17, creatinine 1.0.  BNP was 59.  Most recent laboratory data May 08, 2011, CBC:  White blood cells 9.5, hemoglobin 13.3, hematocrit 43.5, platelets 131.  Basic metabolic panel on May 08, 2011:  Sodium 139, potassium 4.4, glucose 156, BUN 31, creatinine 1.38.  RADIOLOGY DATA:  May 04, 2011, at time of admission, portable chest x- ray shows pulmonary edema, bibasilar opacities, more  focal consolidation on the right greater than left lower lobe.  Most recent chest x-ray May 06, 2011, shows minimal increased bibasilar heterogeneous opacities, right greater than left, and possibly atelectasis.  MICRO DATA:  No micro data available this admission.  HOSPITAL COURSE BY DISCHARGE DIAGNOSES: 1. Acute on chronic respiratory failure.  It is multifactorial in the     setting of GOLD stage III chronic obstructive pulmonary disease,     interstitial lung disease, congestive heart failure, and     questionable community-acquired pneumonia.  The patient was     initially treated with IV Vanc, Fortaz, and Levaquin in the setting     of chronic respiratory failure and possible community-acquired     pneumonia.  He was narrowed quickly to p.o. Levaquin and he will     continue 3 more days for total of 7 days of antibiotics.  Again,     the patient did request DNR and no intubation.  He did well with a     short trial of BiPAP and was quickly weaned back to his baseline     home O2.  He does likely have some component of obstructive sleep     apnea and has continued to be use nightly BiPAP, which he has     tolerated very well.  We will  defer further workup for questionable     obstructive sleep apnea and possible sleep study to Dr. Marchelle Gearing     in outpatient setting.  The patient will be discharged on nightly     BiPAP in the setting of questionable obstructive sleep apnea and     chronic respiratory failure with chronic hypercarbia. 2. Interstitial lung disease.  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 Marchelle Gearing in outpatient     setting.  The patient was treated again with IV antibiotics and IV     steroids as well as inhaled bronchodilators and has been     transitioned to p.o. prednisone and p.o. antibiotics.  He is not on     chronic steroids.  He will be discharged in 3 further days of short     prednisone burst. 3. Chronic  obstructive pulmonary disease, is GOLD stage III.  The     patient does wear home O2 at 3 L.  The patient be discharged on his     regular maintenance chronic obstructive pulmonary disease regimen.     He was treated with IV antibiotics, nebulized bronchodilators,     aggressive pulmonary hygiene, and IV steroids.  Again, he has been     transitioned to p.o. steroids and p.o. antibiotics.  He will be     discharged on his maintenance inhalers. 4. Congestive heart failure.  The patient does have grade 2 diastolic     dysfunction and increased edema on admission.  He was treated with     IV diuresis as creatinine and blood pressure allowed.  The patient     did receive 2 doses of IV Lasix on May 07, 2011, the day prior     to discharge and has had a slight bump in his creatinine, although     he does have chronic renal failure at baseline.  The patient's     Lasix will be held at discharge and the patient has an appointment     to follow up with Rubye Oaks, nurse practitioner in our     Pulmonary Office on May 09, 2011, the day after discharge for     followup BMET and further decisions about resuming diuretic     therapy.5. Questionable community-acquired pneumonia.  Again, the patient was     initially treated with aggressive broad-spectrum IV antibiotics     including Vanc, Fortaz, and Levaquin.  Patient was transitioned to     Levaquin alone and will complete 3 more days for total of 7 days of     antibiotic therapy.  Chest x-ray is improving, and the patient will     follow up in the Pulmonary Office. 6. Diabetes, is chronic issue for this patient.  He is followed by Dr.     [name].  He was continued on sliding scale insulin and Lantus,     and blood sugars were monitored closely in the setting of steroid.     However, the patient does take Lantus 86 units subcu daily at     bedtime prior to admission and blood sugars have been well     controlled during this admission  on only 40 units of Lantus.  The     patient will be discharged on the 40 units and will follow up with     Dr. Jonny Ruiz for further recommendations of maintenance diabetic     insulin therapy. 7. Chronic renal  insufficiency.  It appears that the patient's     baseline creatinine is around 1.1.  The patient did have a slight     bump in creatinine secondary to diuresis on the day prior to     discharge; however, he has a good urine output, and electrolytes     are within normal limits.  The patient will follow up with Tammy     Parrett in our Pulmonary Office tomorrow, Friday, May 09, 2011,     with chemistry and further evaluation of chronic renal     insufficiency in the setting of chronic diuresis.  DISCHARGE MEDICATIONS: 1. Oxygen 3 L nasal cannula continuous. 2. Lantus 40 units subcu nightly. 3. Levaquin 750 mg p.o. daily x3 more days. 4. Prednisone 50 mg p.o. daily x2 more days. 5. Albuterol inhaler 2 puffs inhaled q.4-6 h. p.r.n. for shortness of     breath or wheezing. 6. Artificial Tears 1 drop each eye daily as needed. 7. Asmanex 220 mcg 2 puffs inhaled nightly. 8. Aspirin 81 mg p.o. daily. 9. Depo-Testosterone 200 mg IM once every 2 weeks. 10.Doxepin 10 mg p.o. t.i.d. 11.Finasteride 5 mg p.o. nightly. 12.Formoterol 12 mcg 1 capsule inhaled q.12 h. 13.Gabapentin 800 mg p.o. b.i.d. 14.Levothyroxine, patient takes 200 mcg and 25 mcg tablet to make 225     p.o. daily. 15.Losartan 150 mg p.o. daily. 16.Metformin 1000 mg p.o. b.i.d. 17.Multivitamins 1 tablet p.o. daily. 18.Niacin 500 mg p.o. daily. 19.NovoLog 10-20 units t.i.d. sliding scale. 20.Plaquenil 200 mg p.o. nightly. 21.Ropinirole 1 mg p.o. daily. 22.Zocor 20 mg p.o. nightly. 23.Spiriva 18 mcg 2 capsule inhaled daily. 24.Flomax 0.4 mg p.o. nightly. 25.Vitamin D3 one tablet p.o. daily.  DISCHARGE ACTIVITY:  Increase activity slowly.  DISCHARGE DIET:  Low-sodium, heart healthy diet.  DISCHARGE FOLLOWUP: 1.  The patient is to follow up with Rubye Oaks, nurse practitioner,     in the Pulmonary Office on May 09, 2011, at 9 a.m. 2. The patient should follow up with Dr. Jonny Ruiz, his primary care     physician, in the next 1-2 weeks.  The patient would prefer to call     to make this appointment.  DISPOSITION:  The patient has met maximum benefit from his inpatient hospitalization.  He is medically cleared and ready for discharge pending further outpatient management.  He is to be discharged to home. His acute respiratory failure resolved and his chronic respiratory failure is well managed on maintenance regimen.  The patient will follow up with his primary care as well as Pulmonary in the outpatient setting.  Greater than 30 minutes was spent on this discharge.     Dirk Dress, NP   ______________________________ Felipa Evener, MD    KW/MEDQ  D:  05/08/2011  T:  05/08/2011  Job:  [id number]  cc:   Rubye Oaks, NP Kalman Shan, MD Corwin Levins, MD  Electronically Signed by Danford Bad N.P. on 05/14/2011 08:31:11 PM Electronically Signed by Koren Bound MD on 05/21/2011 04:09:04 PM

## 2011-05-22 ENCOUNTER — Encounter: Payer: Self-pay | Admitting: Internal Medicine

## 2011-05-22 ENCOUNTER — Ambulatory Visit (INDEPENDENT_AMBULATORY_CARE_PROVIDER_SITE_OTHER): Payer: Medicare Other | Admitting: Internal Medicine

## 2011-05-22 VITALS — BP 120/62 | HR 90 | Temp 98.3°F | Ht 75.0 in | Wt 257.6 lb

## 2011-05-22 DIAGNOSIS — J9610 Chronic respiratory failure, unspecified whether with hypoxia or hypercapnia: Secondary | ICD-10-CM

## 2011-05-22 DIAGNOSIS — G4710 Hypersomnia, unspecified: Secondary | ICD-10-CM

## 2011-05-22 DIAGNOSIS — G4730 Sleep apnea, unspecified: Secondary | ICD-10-CM

## 2011-05-22 NOTE — Assessment & Plan Note (Addendum)
Stable  Plan Continue current medications Continue o2 Follow with Dr Vassie Loll for sleep ROV 3-4 months Educated on AECOPD recognition and Rx

## 2011-05-22 NOTE — Patient Instructions (Signed)
Glad  You are doing well Continue your medications Follow with Dr Vassie Loll at [place] for sleep  - continue cpap  REturn in 3-4 months or sooner if needed Learn to recognize quickly the viral attack and call us 24/7 immediately for help

## 2011-05-22 NOTE — Progress Notes (Signed)
Subjective:    Patient ID: [name], male    DOB: Mar 23, 1941, 70 y.o.   MRN: [id number]  HPI 60  yowm quit smoking around 2000 with GOLD III COPD by PFT's 09/2009. Meds through [facility]  June 18, 2010: EX 100 pack year smoker. Home O2 dependent Gold stage 3 COPD with low DLCO 35%  in PFTs MArch 2011. BAeline pulse ox is 87-93% on 2L [place]. In,  OCt 2011 Ct showing some UIP component but predmoninant COPD on CT. 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 Sherene Sires to myself.  In Oct 2011 had some mild AECOPD related hemoptysis with CT showing only UIP but not showing any mass. Since then hemoptysis resolved. Currently feels baseline on 2-3L [place]. Claudication lmits him more than dyspnea at 1/4 th mile. Denies chronic cough but on mucinex and feels it helps. On spiriva, foradil and asmanex through [facility] Has not done rehab though wife is attending rehab. Denies chest pains, edema, wheezing, fever, colored sputum. REC: CHECK AUTOIMMUNE PROFILE,  START NAC, GET FULL PFTs   August 12, 2010: Followup. in interim was admitted for sBP12/19/2011 due to [facility] from prior laparotomies. Resolved with conservative Rx. Started pulmnary rehab 2 weeks ago. Attend 4 sesssions. It is helping so far and he likes it. No new complaints. Dyspnea is stable class 2-3. Denies associated cough, chest pain, hemoptysis, edema, sputum, weight loss. Had autoimmune profile 07/03/2010 - CRP 10, RF 352 but otherwise negative. Upon revealing labs he states he is known to have RA diagnosed over 10 years ago and was on enbrel. He is due to see a rheumatologist at [place], [place]. PFTs today continue to show Gold stage 3 COPD and somewhat improved from a year ago.  August 27, 2010 --Presents for an acute office visit.Complains of increased sob,  dry cough, wheezing. Pt states he doesn't feel like he is getting enough oxygen -cant get enough air. Retaining more fluid.  Denies chest pain, orthopnea, hemoptysis, fever, n/v/d,  headache,recent travel. RX: DOXY AND PRED   OV 02/21/2011: Admitted 7/22 through 7/26 for Acute-on-chronic hypoxemia, probably with chronic obstructive       pulmonary disease exacerbation, pneumonia, acute congestive heart      Failure, (presented with dyspnea, edema, cough). est x-ray on July 22 yield chronic lung changes without acute      overlying pulmonary process.  A 2-D echo yields an ejection fraction of 55% to 60% with grade 2      diastolic dysfunction.  Mitral valve with moderately calcified      Annulus. ( I personally reviewed thos results)  Moderately thickened leaflets. RX: DIURESIS, PRED, AVELOX and O2   Today  Followup from above hospitalization. States he no longer has worsening dyspnea, edema, sputum, cough, fever, chills. Back to baseline o2 need of 3L [place]. However, feels fatigued today more than yesterday. Feels is due to benadryl which he is taking for insomnia. Was talking Remus Loffler but he was falling as a result and stopped. He has class 3 dyspnea at baseline. Has completed rehab.  Compliant with meds>>no changes  03/20/11  Acute OV  Complains of persistent leg swelling. Increased DOE with actiivity . Wears out easily. Lasix has been increased to 40mg  daily with limitedresponse. Weight trending up , now up 10 lbs over last month. Does have cards consult next week. Previous echo last month during hospital admit showed LV nml w/ grade 2 diastolic dysfnx. No chest  pain , increaesed cough or congestion  >>Aldactone 25mg  Twice daily  rx   04/29/2011 Follow up  Pt returns for follow up. Last ov with increased leg swelling despite lasix 40mg . Aldactone 25mg  Twice daily was added to his regimen. He says this did help some however was taken off per cardiology recommendations. He says leg swelling is about the same.  Has gotten some TEDS stocking to wear daily .   Does complain of increased congestion for last few days with green mucus.  Wife was recently tx for bronchitis. NO fever or hemoptysis. No recent abx use. Taking spiriva , foradil and asmanex without known difficulty-gets meds via [place] system.  >>STOP ALDACTONE per DR Nelda Severe  05/09/2011 Post hospital  Pt returns for close follow up from hospitalization. Admitted 10/7-10/11 for acute on chronic resp failure w/ ILD flare, CHF w/ vol overload, and possible CAP along w/ supsected OSA /hypercarbia.  He was tx w/ initial BIPAP w/ transition to nocturnal BIPAP for suspected OSA w/  Associated hypercarbia. Along w/ pulmonary hygiene, IV abx and steroids. HE was aggressively  Diuresised w/ improvement in vol overload. Today bmet  shows improved renal insufficiency .  XRAy showed increased consolidation predominately in bases.  He returns today improved w/ weight loss of >10lbs w/ substantial decreased in edema.  Feels his breathing is back to his baseline.  No wheezing or hemoptysis. No chest pain or fever.    REC Taper Prednisone over next week.  Restart Lasix 40mg  daily  Finish Levaquin (antibiotic ) as directed.  Wear BIPAP at night.  We are setting you up for sleep study.  After sleep study , we are setting you up for a consult with Dr. Vassie Loll at [facility]  follow up Dr. Marchelle Gearing in 2 weeks as planned   OV 05/22/11: Followup for above. Recent AECOPD. Suspected OSA; sent hom on bipap/cpap which he says is helping sleep quality and wakefulness immensely. Thinks sleep study is abnromal. He is due to see Dr Vassie Loll soon for it. Denies resp discoomfort. Still bothered by cosmetic effects of edema. He is frustrated that despite lasix and fluid restriction it still persists (has varicose veins). Advised him to see what happens with OSA Rx. He also wants to know how to prevent AECOPD      Review of Systems  Constitutional: Negative for fever and unexpected weight change.  HENT: Negative for ear pain, nosebleeds, congestion, sore throat, rhinorrhea, sneezing, trouble swallowing,  dental problem, postnasal drip and sinus pressure.   Eyes: Negative for redness and itching.  Respiratory: Negative for cough, chest tightness, shortness of breath and wheezing.   Cardiovascular: Negative for palpitations and leg swelling.  Gastrointestinal: Negative for nausea and vomiting.  Genitourinary: Negative for dysuria.  Musculoskeletal: Negative for joint swelling.  Skin: Negative for rash.  Neurological: Negative for headaches.  Hematological: Does not bruise/bleed easily.  Psychiatric/Behavioral: Negative for dysphoric mood. The patient is not nervous/anxious.        Objective:   Physical Exam General:  well developed, well nourished, in no acute distress, obese  Head:  normocephalic and atraumatic Eyes:  PERRLA/EOM intact; conjunctiva and sclera clear Ears:  TMs intact and clear with normal canals Nose:  no deformity, discharge, inflammation, or lesions Mouth:  no deformity or lesions Neck:  no masses, thyromegaly, or abnormal cervical nodes Chest Wall:  no deformities noted Lungs:  decreased BS bilateral and NO coarse velcro crackles at bases  Heart:  regular rate and rhythm, S1, S2  without murmurs, rubs, gallops, or clicks, tr edema  Abdomen:  bowel sounds positive; abdomen soft and non-tender without masses, or organomegaly Msk:  no deformity or scoliosis noted with normal posture Pulses:  pulses normal Extremities:  no clubbing, cyanosis, or deformity noted. + EDEMA Neurologic:  CN II-XII grossly intact with normal reflexes, coordination, muscle strength and tone Skin:  intact without lesions or rashes Cervical Nodes:  no significant adenopathy Axillary Nodes:  no significant adenopathy Psych:  alert and cooperative; normal mood and affect; normal attention span and concentration         Assessment & Plan:

## 2011-05-23 ENCOUNTER — Telehealth: Payer: Self-pay | Admitting: Pulmonary Disease

## 2011-05-23 NOTE — Procedures (Signed)
NAMEDETAVIOUS, [name]            ACCOUNT NO.:  [id number]  MEDICAL RECORD NO.:  [id number]          PATIENT TYPE:  OUT  LOCATION:  SLEEP CENTER                 FACILITY:  [facility]  PHYSICIAN:  Oretha Milch, MD      DATE OF BIRTH:  1941/04/28  DATE OF STUDY:  05/12/2011                           NOCTURNAL POLYSOMNOGRAM  REFERRING PHYSICIAN:  Kalman Shan, MD  INDICATION FOR STUDY:  Mr. [name] is a 70 year old gentleman with gold stage III COPD, on 3-4 L of oxygen and a history of asbestos exposure. He was hospitalized in October 2012, for acute on chronic respiratory failure with hypercarbia.  He was diuresed with improvement. At the time of this study, he weighed 248 pounds with a height of 75 inches, BMI of 31, neck size 17.5 inches.  EPWORTH SLEEPINESS SCORE:  16.  MEDICATIONS:  Include losartan, metformin, levothyroxine, aspirin, Lantus, ropinirole, insulin, guaifenesin, doxepin, Spiriva, Foradil, Asmanex, rosuvastatin gabapentin, HCTZ, ProAir, oxygen, tamsulosin, finasteride.  This intervention polysomnogram was performed with a sleep technologist in attendance. EEG, EOG, EMG, EKG, and respiratory parameters were recorded.  Sleep stages arousals, limb movements, and respiratory data were scored according to criteria laid out by the American Academy of Sleep Medicine.  SLEEP ARCHITECTURE:  Lights out was at 10:43 p.m.  Lights on was at 5:03 a.m.  Total sleep time during diagnostic portion was 120 minutes with a sleep period time of 183 minutes and sleep latency of 2 minutes.  Sleep stages as the percentage of total sleep time was N1 52%, N2 48%.  No slow wave or REM sleep was noted. Wake after sleep onset accounted for 48 minutes.  No supine sleep was noted. During the titration portion, supine REM sleep accounted for 6.5 minutes and REM sleep accounted for 35 minutes and 3 small stages.  RESPIRATORY DATA:  During the diagnostic portion, there were 3 obstructive  apneas, 0 central apneas, 0 mixed apneas, and 93 hypopneas with an apnea-hypopnea index of 48 events per hour and a lowest desaturation was 78%.  Due to this degree of respiratory disturbance, CPAP was initiated at 4 cm and titrated to a final level of 12 cm due to desaturations. In the absence of respiratory events, oxygen was added and titrated to 2 L/minute blended in through the CPAP. At this final level of 12 cm with a medium full face mask, 403 minutes of sleep including 22 minutes of REM sleep and 6 minutes of REM supine sleep, 5 hypopneas were noted with an AHI of 2.9, and the lowest desaturation of 82% during REM sleep.  This appears to be the optimal level used during the study.  AROUSAL DATA:  During the diagnostic portion, there were total of 116 arousals with an arousal index of 58 events per hour. During the titration portion, few were arousals, about 32 were noted with an arousal index of 12 events per hour.  OXYGEN DATA:  The desaturation index during the diagnostic portion was 54 per hour and he spent 70% of sleep time saturation less than 88%. Desaturations were corrected on CPAP of 12 cm with 2 L of oxygen except during REM sleep when the lowest desaturation was 82%.  CARDIAC DATA:  The low heart rate was 32 beats per minute.  The high heart rate recorded was an artifact.  No arrhythmias were noted other than a few PVCs.  DISCUSSION:  Desaturations were noted in the absence of respiratory event at the final level of CPAP, suggesting hypoxemia related to underlying COPD, especially with REM-related hypoventilation.  He was desensitized with a medium full-face mask and felt rested the morning after the study.  MOVEMENT-PARASOMNIA:  No significant limb movements were noted.  IMPRESSIONS-RECOMMENDATIONS: 1. Severe obstructive sleep apnea with predominant hypopneas causing     sleep fragmentation and oxygen desaturation. 2. Sleep-related hypoxemia especially during  REM sleep related to     hypoventilation with severe chronic obstructive pulmonary disease. 3. No evidence of cardiac arrhythmias, limb movements, or behavioral     disturbance during sleep.  RECOMMENDATIONS: 1. The treatment options for this degree of sleep-disordered breathing     include CPAP therapy and oxygen.  Weight loss is also recommended. 2. CPAP should be initiated at 12 cm with 3 L of oxygen blended in.     Note that REM-related hypoxemia persisted on 2 L of oxygen.  A     medium full face mask can be used. 3. He should be cautioned against medications with sedative side     effects.  He should be advised against driving when sleepy.     Oretha Milch, MD    RVA/MEDQ  D:  05/22/2011 15:47:45  T:  05/23/2011 00:31:49  Job:  [id number]

## 2011-05-23 NOTE — Telephone Encounter (Signed)
Ok thanks 

## 2011-05-23 NOTE — Telephone Encounter (Signed)
[name]  Patient told me yesterday he was [facility] boipap[facility]cpap. And this is working well for him ? Based on this do you stil want me to do order or you will see and readjust  MR

## 2011-05-23 NOTE — Telephone Encounter (Signed)
PSG showed severe OSA correctd by CPAP 12 cm, 3 L O2 blended in , c fex +2 cm, humidity, Download in 1 month Pl ensure appt with me MR, can you pl copy & paste this order to DME so that he can get started

## 2011-05-23 NOTE — Telephone Encounter (Signed)
No prob then, I will adjust on FU visit

## 2011-06-10 ENCOUNTER — Ambulatory Visit (INDEPENDENT_AMBULATORY_CARE_PROVIDER_SITE_OTHER): Payer: BC Managed Care – PPO | Admitting: Pulmonary Disease

## 2011-06-10 ENCOUNTER — Encounter: Payer: Self-pay | Admitting: Pulmonary Disease

## 2011-06-10 VITALS — BP 132/62 | HR 72 | Temp 98.4°F | Ht 75.0 in | Wt 258.0 lb

## 2011-06-10 DIAGNOSIS — Z9989 Dependence on other enabling machines and devices: Secondary | ICD-10-CM | POA: Insufficient documentation

## 2011-06-10 DIAGNOSIS — G4733 Obstructive sleep apnea (adult) (pediatric): Secondary | ICD-10-CM | POA: Insufficient documentation

## 2011-06-10 NOTE — Assessment & Plan Note (Signed)
Severe, with marked improvement with CPAP & O2 - Blend in3 L o2 for hypoventilation during REM If remains symptomatic, wil rpt oNO at this level Weight loss encouraged, compliance with goal of at least 4-6 hrs every night is the expectation. Advised against medications with sedative side effects Cautioned against driving when sleepy - understanding that sleepiness will vary on a day to day basis

## 2011-06-10 NOTE — Progress Notes (Signed)
  Subjective:    Patient ID: [name], male    DOB: 02/14/1941, 70 y.o.   MRN: [id number]  HPI 76 yowm quit smoking around 2000 with GOLD III COPD on 3-4 L o2. Meds through [facility] He was hospitalised in 10/12 for acute on chronic hypercarbic resp failure. He also has gr 2 diastolic dysfunction & improved with diuresis . He was statred on VPAP S auto on discharge [facility])  PSG 10/15 showed severe OSA, AHI 48/h with nadir desatn 78% correctd by CPAP 12 cm, 3 L O2 blended in , c flex +2 cm, humidity, A medium full face mask was used. Sleep related hypoxemia due to REM  Hypoventilation & copd was noted partially corrected by O2. Desaturations persisted without resp events on CPAP 12 cm  Bedtime 10-11 pm, sleep latency 15 mins, he is sleeping earlier with CPAP, oob at 0600, feels much better rested,no morning headaches or dryness, ESS 0. He has gained 15 ls over the last 2 years. There is no history suggestive of cataplexy, sleep paralysis or parasomnias    Review of Systems  Constitutional: Negative for fever, appetite change and unexpected weight change.  HENT: Negative for ear pain, congestion, sore throat, rhinorrhea, sneezing, trouble swallowing, dental problem and postnasal drip.   Eyes: Negative for redness.  Respiratory: Positive for shortness of breath. Negative for cough and wheezing.   Cardiovascular: Negative for chest pain, palpitations and leg swelling.  Gastrointestinal: Negative for nausea, vomiting, abdominal pain and diarrhea.  Genitourinary: Negative for dysuria and urgency.  Musculoskeletal: Positive for joint swelling and arthralgias.  Skin: Negative for rash.  Neurological: Negative for syncope and headaches.  Hematological: Does not bruise/bleed easily.  Psychiatric/Behavioral: Negative for dysphoric mood. The patient is not nervous/anxious.        Objective:   Physical Exam  Gen. Pleasant, well-nourished, in no distress, normal affect ENT - no  lesions, no post nasal drip, class 2 airway Neck: No JVD, no thyromegaly, no carotid bruits Lungs: no use of accessory muscles, no dullness to percussion, coarse BS without rales or rhonchi  Cardiovascular: Rhythm regular, heart sounds  normal, no murmurs or gallops, no peripheral edema Abdomen: soft and non-tender, no hepatosplenomegaly, BS normal. Musculoskeletal: No deformities, no cyanosis or clubbing Neuro:  alert, non focal       Assessment & Plan:

## 2011-06-10 NOTE — Patient Instructions (Signed)
We will get you a humidifier & a new medium full face mask I will get a download from your machine & make changes as needed Your machine should be set at 12 cm You need 3 L oxygen Keep using your CPAP machine at least 6h every night

## 2011-06-11 ENCOUNTER — Encounter: Payer: Self-pay | Admitting: Internal Medicine

## 2011-06-11 ENCOUNTER — Telehealth: Payer: Self-pay | Admitting: Pulmonary Disease

## 2011-06-11 ENCOUNTER — Ambulatory Visit (INDEPENDENT_AMBULATORY_CARE_PROVIDER_SITE_OTHER): Payer: Medicare Other | Admitting: Internal Medicine

## 2011-06-11 VITALS — BP 120/78 | HR 69 | Temp 98.3°F | Ht 75.0 in | Wt 260.5 lb

## 2011-06-11 DIAGNOSIS — E039 Hypothyroidism, unspecified: Secondary | ICD-10-CM

## 2011-06-11 DIAGNOSIS — Z9989 Dependence on other enabling machines and devices: Secondary | ICD-10-CM

## 2011-06-11 DIAGNOSIS — M069 Rheumatoid arthritis, unspecified: Secondary | ICD-10-CM

## 2011-06-11 DIAGNOSIS — E119 Type 2 diabetes mellitus without complications: Secondary | ICD-10-CM

## 2011-06-11 DIAGNOSIS — I10 Essential (primary) hypertension: Secondary | ICD-10-CM

## 2011-06-11 DIAGNOSIS — F329 Major depressive disorder, single episode, unspecified: Secondary | ICD-10-CM

## 2011-06-11 HISTORY — DX: Rheumatoid arthritis, unspecified: M06.9

## 2011-06-11 NOTE — Telephone Encounter (Signed)
LMOM for Lecritia to call back.

## 2011-06-11 NOTE — Progress Notes (Signed)
Subjective:    Patient ID: [name], male    DOB: 04-21-1941, 70 y.o.   MRN: [id number]  HPI  Here to f/u, Here to f/u; overall doing ok,  Pt denies chest pain, increased sob or doe, wheezing, orthopnea, PND, increased LE swelling, palpitations, dizziness or syncope.  Pt denies new neurological symptoms such as new headache, or facial or extremity weakness or numbness   Pt denies polydipsia, polyuria, or low sugar symptoms such as weakness or confusion improved with po intake.  Pt states overall good compliance with meds, trying to follow lower cholesterol, diabetic diet, wt overall stable but little exercise however.  Due for f/u with endo at [facility] tomorrow and prefers labs to be done there this time.   Has BPH per urology, suggestted surgury but due to chance of incontinence pt deferred, now being tx with meds only.   Has known RA,, sees MD at the [place] in [place] , and optho locally in [place] yearly as he is on the  Plaquenil.  No other acute complaints or concerns. Denies hyper or hypo thyroid symptoms such as voice, skin or hair change.  Denies worsening depressive symptoms, suicidal ideation, or panic. Past Medical History  Diagnosis Date  . THRUSH 11/06/2009  . HYPOTHYROIDISM 07/30/2009  . DIABETES MELLITUS, TYPE II 11/23/2009  . DEPRESSION 11/23/2009  . 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 HYPERTENSION 07/30/2009  . CORONARY ARTERY DISEASE 11/23/2009  . CHRONIC OBSTRUCTIVE PULMONARY DISEASE, ACUTE EXACERBATION 03/01/2010  . C O P D 07/30/2009  . 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 RESPIRATORY FAILURE, CHRONIC 07/31/2009  . BENIGN PROSTATIC HYPERTROPHY 11/23/2009  . DEGENERATIVE JOINT DISEASE 11/23/2009  . 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 FATIGUE 11/23/2009  . 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 GAIT DISTURBANCE 12/10/2009  . DYSPNEA/SHORTNESS OF BREATH 12/08/2009  . HEMOPTYSIS UNSPECIFIED 05/07/2010  . RA (rheumatoid arthritis) 06/11/2011   Past Surgical History  Procedure Date  . Small bowel  obstruction repair with adhesiolysis   . Hx of remote ileum resection due to bleeding   . Abdominal aortic aneurysm repair   . S/p back surgury  1972    lumbar laminectomy Dr. Fannie Knee  . S/p left femoral embolectomy with left leg ischemia     Dr. Hart [place], vascular  . S/p right ganglion cyst     Dr. Teressa Senter    reports that he quit smoking about 12 years ago. His smoking use included Cigarettes, Pipe, and Cigars. He has a 100 pack-year smoking history. He has never used smokeless tobacco. He reports that he does not drink alcohol or use illicit drugs. family history is not on file. No Known Allergies Current Outpatient Prescriptions on File Prior to Visit  Medication Sig Dispense Refill  . albuterol (PROAIR HFA) 108 (90 BASE) MCG/ACT inhaler Inhale 2 puffs into the lungs as needed. Shortness of breath      . aspirin 81 MG tablet Take 81 mg by mouth daily.        . B-D INS SYR ULTRAFINE 1CC/30G 30G X 1/2" 1 ML MISC       . Cholecalciferol (VITAMIN D3) 2000 UNITS TABS Take 1 tablet by mouth daily.       [name] DEPO-TESTOSTERONE 200 MG/ML injection Inject 200 mg into the muscle every 14 (fourteen) days.      [name] doxepin (SINEQUAN) 10 MG capsule Take 10 mg by mouth 3 (three) times daily.       [name] doxepin (SINEQUAN) 50 MG capsule Take 50 mg by  mouth at bedtime.        . finasteride (PROSCAR) 5 MG tablet Take 5 mg by mouth daily.        . formoterol (FORADIL AEROLIZER) 12 MCG capsule for inhaler Inhale contents of 1 capsule two times a day       . furosemide (LASIX) 40 MG tablet Take 1 tablet (40 mg total) by mouth daily.  90 tablet  3  . gabapentin (NEURONTIN) 800 MG tablet Take 800 mg by mouth 2 (two) times daily.        . insulin aspart (NOVOLOG) 100 UNIT/ML injection 10-20 Units 3 (three) times daily before meals. Sliding scale as needed with meals (14 units in the morning 12 units at lunch and 24 units at dinner)      . insulin glargine (LANTUS) 100 UNIT/ML injection Inject 86 Units into the skin at  bedtime.       [name] levothyroxine (LEVOTHROID) 25 MCG tablet Take 1 tablet (25 mcg total) by mouth daily.  90 tablet  3  . levothyroxine (SYNTHROID, LEVOTHROID) 200 MCG tablet Take 200 mcg by mouth daily.       [name] losartan (COZAAR) 100 MG tablet 1/2 by mouth once daily       . metFORMIN (GLUCOPHAGE) 1000 MG tablet Take 1,000 mg by mouth 2 (two) times daily.        . mometasone (ASMANEX 60 METERED DOSES) 220 MCG/INH inhaler Inhale 2 puffs into the lungs daily.        . Multiple Vitamins-Minerals (MEGA MULTIVITAMIN FOR MEN PO) Take by mouth daily.        . naphazoline (CLEAR EYES) 0.012 % ophthalmic solution Place 1 drop into both eyes as needed. Red eyes       . niacin (SLO-NIACIN) 500 MG tablet Take 500 mg by mouth at bedtime.        [name] PLAQUENIL 200 MG tablet Take 1 tablet by mouth Daily.      [name] rOPINIRole (REQUIP) 1 MG tablet Take 1 mg by mouth at bedtime.        . simvastatin (ZOCOR) 20 MG tablet Take 5 mg by mouth at bedtime.       . Tamsulosin HCl (FLOMAX) 0.4 MG CAPS Take by mouth at bedtime.        [name] tiotropium (SPIRIVA HANDIHALER) 18 MCG inhalation capsule Place 18 mcg into inhaler and inhale daily.        [name] VIAGRA 100 MG tablet prn       Review of Systems Review of Systems  Constitutional: Negative for diaphoresis and unexpected weight change.  HENT: Negative for drooling and tinnitus.   Eyes: Negative for photophobia and visual disturbance.  Respiratory: Negative for choking and stridor.   Gastrointestinal: Negative for vomiting and blood in stool.  Genitourinary: Negative for hematuria and decreased urine volume.    Objective:   Physical Exam BP 120/78  Pulse 69  Temp(Src) 98.3 F (36.8 C) (Oral)  Ht 6\' 3"  (1.905 m)  Wt 260 lb 8 oz (118.162 kg)  BMI 32.56 kg/m2  SpO2 91% Physical Exam  VS noted, wearing o2 chronic Constitutional: Pt appears well-developed and well-nourished.  HENT: Head: Normocephalic.  Right Ear: External ear normal.  Left Ear: External ear normal.    Eyes: Conjunctivae and EOM are normal. Pupils are equal, round, and reactive to light.  Neck: Normal range of motion. Neck supple.  Cardiovascular: Normal rate and regular rhythm.   Pulmonary/Chest: Effort normal and breath sounds  normal.  Abd:  Soft, NT, non-distended, + BS Neurological: Pt is alert. No cranial nerve deficit.  Skin: Skin is warm. No erythema.  Psychiatric: Pt behavior is normal. Thought content normal.  not overly nervous or depressed affect today    Assessment & Plan:

## 2011-06-11 NOTE — Telephone Encounter (Signed)
Called and spoke with [place] from [facility].  She states pt was originally set up on bipap 14/7 back in Oct while pt was in hosp.  However, when pt saw RA on 06/10/11, he was changed to cpap 12 cm.  Lecritia states pt c/o not having enough pressure on cpap machine and would like to go back on bipap- stated he did much better on bipap.  RA not in office this week.  Will forward message to VS to address.  Please advise.  Thanks.

## 2011-06-11 NOTE — Assessment & Plan Note (Signed)
stable overall by hx and exam, most recent data reviewed with pt, and pt to continue medical treatment as before  BP Readings from Last 3 Encounters:  06/11/11 120/78  06/10/11 132/62  05/22/11 120/62

## 2011-06-11 NOTE — Assessment & Plan Note (Signed)
stable overall by hx and exam, most recent data reviewed with pt, and pt to continue medical treatment as before  Lab Results  Component Value Date   TSH 0.292* 02/18/2011

## 2011-06-11 NOTE — Assessment & Plan Note (Signed)
stable overall by hx and exam, most recent data reviewed with pt, and pt to continue medical treatment as before  Lab Results  Component Value Date   HGBA1C 8.5* 02/17/2011   Pt plans for f/u a1c at [facility] tomorrow, but to f/u here later in the wk if not done

## 2011-06-11 NOTE — Assessment & Plan Note (Signed)
stable overall by hx and exam, most recent data reviewed with pt, and pt to continue medical treatment as before  Lab Results  Component Value Date   WBC 9.5 05/08/2011   HGB 13.3 05/08/2011   HCT 43.5 05/08/2011   PLT 131* 05/08/2011   GLUCOSE 150* 05/09/2011   CHOL 155 02/18/2011   TRIG 107 02/18/2011   HDL 37* 02/18/2011   LDLCALC 97 02/18/2011   ALT 22 02/17/2011   AST 27 02/17/2011   NA 142 05/09/2011   K 4.3 05/09/2011   CL 96 05/09/2011   CREATININE 1.1 05/09/2011   BUN 30* 05/09/2011   CO2 40* 05/09/2011   TSH 0.292* 02/18/2011   PSA 0.67 11/23/2009   INR 0.94 05/04/2011   HGBA1C 8.5* 02/17/2011

## 2011-06-11 NOTE — Patient Instructions (Signed)
Continue all other medications as before Please keep your appointments with your specialists as you have planned, including [facility] Please call to have [place] do your blood work later this wk if not done at the [place] Please return in 6 months, or sooner if needed

## 2011-06-11 NOTE — Telephone Encounter (Signed)
His insurance is listed as medicare.  He would need to have documented failure of CPAP therapy prior to being eligible for BPAP therapy.  This decision is best deferred to Dr. Vassie Loll.  Will forward to Dr. Vassie Loll for his review.

## 2011-06-12 ENCOUNTER — Encounter: Payer: Self-pay | Admitting: Adult Health

## 2011-06-12 ENCOUNTER — Telehealth: Payer: Self-pay | Admitting: Internal Medicine

## 2011-06-12 ENCOUNTER — Ambulatory Visit (INDEPENDENT_AMBULATORY_CARE_PROVIDER_SITE_OTHER): Payer: Medicare Other | Admitting: Adult Health

## 2011-06-12 DIAGNOSIS — J449 Chronic obstructive pulmonary disease, unspecified: Secondary | ICD-10-CM

## 2011-06-12 NOTE — Telephone Encounter (Signed)
Called and spoke with pt.  Pt states he has been having increased sob and would like to be seen.  Pt scheduled to see TP today at 3:45pm

## 2011-06-12 NOTE — Patient Instructions (Addendum)
Increase Lasix 40mg  1 extra tablet for next 2 days  Low salt diet  Legs elevated.  Follow up Dr. Vassie Loll  And Marchelle Gearing As planned  Please contact office for sooner follow up if symptoms do not improve or worsen or seek emergency care

## 2011-06-12 NOTE — Telephone Encounter (Signed)
LM with Sindy Guadeloupe informing her message to go to Dr. Vassie Loll to address who is not scheduled in the office until 11/19.  We will call her back once we have RA's response.

## 2011-06-15 NOTE — Telephone Encounter (Signed)
Pl inform pt directly that we can increase pr to 14 cm Would recommend  this first - [employer] will not approve bipap directly. OK to sen dorder if he is agreeable

## 2011-06-16 ENCOUNTER — Telehealth: Payer: Self-pay | Admitting: Pulmonary Disease

## 2011-06-16 NOTE — Telephone Encounter (Signed)
Returning call can be reached at [phone].[name]

## 2011-06-16 NOTE — Telephone Encounter (Signed)
I spoke with pt and he states he doesn't understand why he can't be set up on bpap. I advised him per last note he must try and fail cpap before medicare will cover bpap. Pt verbalized understanding and needed nothing further

## 2011-06-16 NOTE — Telephone Encounter (Signed)
I spoke with pt wife and she states pt would like to speak with JR again. JR has already spoken with pt see 06/11/11. Wife will have pt call back

## 2011-06-16 NOTE — Telephone Encounter (Signed)
I informed pt of RA's findings and recommendations. Pt verbalized understanding. Pt is willing to go with CPAP pressure of 14 cm. Order placed.

## 2011-06-17 ENCOUNTER — Telehealth: Payer: Self-pay | Admitting: Pulmonary Disease

## 2011-06-17 ENCOUNTER — Telehealth: Payer: Self-pay

## 2011-06-17 DIAGNOSIS — Z9989 Dependence on other enabling machines and devices: Secondary | ICD-10-CM

## 2011-06-17 NOTE — Progress Notes (Signed)
Subjective:    Patient ID: [name], male    DOB: [birth year], 70 y.o.   MRN: [id number]  HPI 89 yowm quit smoking around 2000 with GOLD III COPD by PFT's 09/2009. Meds through [facility]  June 18, 2010: EX 100 pack year smoker. Home O2 dependent Gold stage 3 COPD with low DLCO 35%  in PFTs MArch 2011. BAeline pulse ox is 87-93% on 2L [place]. In,  OCt 2011 Ct showing some UIP component but predmoninant COPD on CT. 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 Sherene Sires to myself.  In Oct 2011 had some mild AECOPD related hemoptysis with CT showing only UIP but not showing any mass. Since then hemoptysis resolved. Currently feels baseline on 2-3L [place]. Claudication lmits him more than dyspnea at 1/4 th mile. Denies chronic cough but on mucinex and feels it helps. On spiriva, foradil and asmanex through [facility]. Has not done rehab though wife is attending rehab. Denies chest pains, edema, wheezing, fever, colored sputum. REC: CHECK AUTOIMMUNE PROFILE,  START NAC, GET FULL PFTs   August 12, 2010: Followup. in interim was admitted for sBP12/19/2011 due to [facility] from prior laparotomies. Resolved with conservative Rx. Started pulmnary rehab 2 weeks ago. Attend 4 sesssions. It is helping so far and he likes it. No new complaints. Dyspnea is stable class 2-3. Denies associated cough, chest pain, hemoptysis, edema, sputum, weight loss. Had autoimmune profile 07/03/2010 - CRP 10, RF 352 but otherwise negative. Upon revealing labs he states he is known to have RA diagnosed over 10 years ago and was on enbrel. He is due to see a rheumatologist at [place], [place]. PFTs today continue to show Gold stage 3 COPD and somewhat improved from a year ago.  August 27, 2010 --Presents for an acute office visit.Complains of increased sob,  dry cough, wheezing. Pt states he doesn't feel like he is getting enough oxygen -cant get enough air. Retaining more fluid.  Denies chest pain, orthopnea, hemoptysis, fever, n/v/d,  headache,recent travel. RX: DOXY AND PRED   OV 02/21/2011: Admitted 7/22 through 7/26 for Acute-on-chronic hypoxemia, probably with chronic obstructive       pulmonary disease exacerbation, pneumonia, acute congestive heart      Failure, (presented with dyspnea, edema, cough). est x-ray on July 22 yield chronic lung changes without acute      overlying pulmonary process.  A 2-D echo yields an ejection fraction of 55% to 60% with grade 2      diastolic dysfunction.  Mitral valve with moderately calcified      Annulus. ( I personally reviewed thos results)  Moderately thickened leaflets. RX: DIURESIS, PRED, AVELOX and O2   Today  Followup from above hospitalization. States he no longer has worsening dyspnea, edema, sputum, cough, fever, chills. Back to baseline o2 need of 3L . However, feels fatigued today more than yesterday. Feels is due to benadryl which he is taking for insomnia. Was talking Remus Loffler but he was falling as a result and stopped. He has class 3 dyspnea at baseline. Has completed rehab.  Compliant with meds>>no changes  03/20/11  Acute OV  Complains of persistent leg swelling. Increased DOE with actiivity . Wears out easily. Lasix has been increased to 40mg  daily with limitedresponse. Weight trending up , now up 10 lbs over last month. Does have cards consult next week. Previous echo last month during hospital admit showed LV nml w/ grade 2 diastolic dysfnx. No chest pain ,  increaesed cough or congestion  >>Aldactone 25mg  Twice daily  rx   04/29/2011 Follow up  Pt returns for follow up. Last ov with increased leg swelling despite lasix 40mg . Aldactone 25mg  Twice daily was added to his regimen. He says this did help some however was taken off per cardiology recommendations. He says leg swelling is about the same.  Has gotten some TEDS stocking to wear daily .   Does complain of increased congestion for last few days with green mucus.  Wife was recently tx for bronchitis. NO fever or hemoptysis. No recent abx use. Taking spiriva , foradil and asmanex without known difficulty-gets meds via [place] system.  >>STOP ALDACTONE per DR Nelda Severe  05/09/2011 Post hospital  Pt returns for close follow up from hospitalization. Admitted 10/7-10/11 for acute on chronic resp failure w/ ILD flare, CHF w/ vol overload, and possible CAP along w/ supsected OSA /hypercarbia.  He was tx w/ initial BIPAP w/ transition to nocturnal BIPAP for suspected OSA w/  Associated hypercarbia. Along w/ pulmonary hygiene, IV abx and steroids. HE was aggressively  Diuresised w/ improvement in vol overload. Today bmet  shows improved renal insufficiency .  XRAy showed increased consolidation predominately in bases.  He returns today improved w/ weight loss of >10lbs w/ substantial decreased in edema.  Feels his breathing is back to his baseline.  No wheezing or hemoptysis. No chest pain or fever.  >>pred taper , lasix 40mg  , sleep study and referral to [facility]  OV 05/22/11: Followup for above. Recent AECOPD. Suspected OSA; sent hom on bipap/cpap which he says is helping sleep quality and wakefulness immensely. Thinks sleep study is abnromal. He is due to see Dr Vassie Loll soon for it. Denies resp discoomfort. Still bothered by cosmetic effects of edema. He is frustrated that despite lasix and fluid restriction it still persists (has varicose veins). Advised him to see what happens with OSA Rx. He also wants to know how to prevent AECOPD >>  06/11/11 Acute OV  Complains of increased SOB, prod cough with clear mucus, body aches, weight increase of 4# x2days. Has increaesed edema and weight is up 4 lbs. He is more dyspneic with activity. We reviewed his meds . He did eat quite a bit of salty food yesterday. We discussed low salt diet.  Wants to catch his breathing before it gets too bad. No discolored mucus or fever.   Review of Systems Constitutional:   No  weight loss, night  sweats,  Fevers, chills, fatigue, or  lassitude.  HEENT:   No headaches,  Difficulty swallowing,  Tooth/dental problems, or  Sore throat,                No sneezing, itching, ear ache, nasal congestion, post nasal drip,   CV:  No chest pain,  Orthopnea, PND, swelling in lower extremities, anasarca, dizziness, palpitations, syncope.   GI  No heartburn, indigestion, abdominal pain, nausea, vomiting, diarrhea, change in bowel habits, loss of appetite, bloody stools.   Resp: No shortness of breath with exertion or at rest.  No excess mucus, no productive cough,  No non-productive cough,  No coughing up of blood.  No change in color of mucus.  No wheezing.  No chest wall deformity  Skin: no rash or lesions.  GU: no dysuria, change in color of urine, no urgency or frequency.  No flank pain, no hematuria   MS:  No joint swelling.  No decreased range of motion.    Psych:  No  change in mood or affect. No depression or anxiety.  No memory loss.         Objective:   Physical Exam GEN: A/Ox3; pleasant , NAD, chronically ill appearing and elderly   HEENT:  [place]/AT,  EACs-clear, TMs-wnl, NOSE-clear, THROAT-clear, no lesions, no postnasal drip or exudate noted.   NECK:  Supple w/ fair ROM; no JVD;  carotid impulses w/o bruits; no thyromegaly or nodules palpated; no lymphadenopathy.  RESP  Coarse BS  w/o, wheezes/ rales/ or rhonchi.no accessory muscle use, no dullness to percussion  CARD:  RRR, no m/r/g  , 1+  peripheral edema bilaterally , pulses intact, no cyanosis or clubbing.  GI:   Soft & nt; nml bowel sounds; no organomegaly or masses detected.  Musco: Warm bil, no deformities or joint swelling noted.   Neuro: alert, no focal deficits noted.    Skin: Warm, no lesions or rashes         Assessment & Plan:

## 2011-06-17 NOTE — Telephone Encounter (Signed)
Pt says his pressure setting was changed yesterday from 12 to 14 and he had trouble exhaling at the new pressure when lying down. He says he would have to sit on the edge of his bed so he did not feel like he was suffocating. Pls advise of any recs for this patient.

## 2011-06-17 NOTE — Telephone Encounter (Signed)
Most often the form is forwarded to [place] from the DM shoe provider  Please ask pt to go ahead and ask the supplier to do this

## 2011-06-17 NOTE — Telephone Encounter (Signed)
If his problem is not enough pressure , then keep him on 14 cm, C flex setting of +3 will allow him to exhale.

## 2011-06-17 NOTE — Telephone Encounter (Signed)
Patient informed. 

## 2011-06-17 NOTE — Telephone Encounter (Signed)
I spoke with pt and he states he does not want to go down to 12. Pt states 14 is not enough pressure. Pt wanted to increase his cpap settings. Pt states when he is lying down he feels like he can't breathe bc it's not enough air coming through the machine. Please advise Dr. Vassie Loll, thanks

## 2011-06-17 NOTE — Assessment & Plan Note (Signed)
Mild flare with vol overload  Advised on dietary issues w/ low salt intake  Plan;  increase Lasix 40mg  1 extra tablet for next 2 days  Low salt diet  Legs elevated.  Follow up Dr. Vassie Loll  As planned  Please contact office for sooner follow up if symptoms do not improve or worsen or seek emergency care

## 2011-06-17 NOTE — Telephone Encounter (Signed)
Cancel C flex  He has VPAP auto machine - change him to 14/10 cm - this should work well for him , cmoparable t earlier settings.

## 2011-06-17 NOTE — Telephone Encounter (Signed)
Ok to go back down to 12 cm Pl talk to [facility] & send order as needed

## 2011-06-17 NOTE — Telephone Encounter (Signed)
Patient is requesting a prescription for Diabetic shoes. He was in recently and failed to mention. Call back number is [phone]

## 2011-06-17 NOTE — Telephone Encounter (Signed)
Per [facility] pt as of yesterday has a CPAP machine for 30 days with download. For insurance purposes pt will need to try and fail CPAP in order to have coverage on BiPAP. Per RA please order C-Flex pressure of 14 +3 or equivalent  Left message with spouse for pt to return call.

## 2011-06-18 NOTE — Telephone Encounter (Signed)
Pt calling back for Shanda Bumps & asked to be called at [phone].  Antionette Fairy

## 2011-06-18 NOTE — Telephone Encounter (Signed)
I informed pt of RA's findings and recommendations. Pt verbalized understanding. Alida R has faxed order to [facility].

## 2011-06-24 NOTE — Progress Notes (Signed)
Staff note: findings of NP noted. AGree with plan

## 2011-06-25 ENCOUNTER — Telehealth: Payer: Self-pay | Admitting: Pulmonary Disease

## 2011-06-25 DIAGNOSIS — G4733 Obstructive sleep apnea (adult) (pediatric): Secondary | ICD-10-CM

## 2011-06-25 NOTE — Telephone Encounter (Signed)
PT ret call & can be reached at [phone].  Antionette Fairy

## 2011-06-25 NOTE — Telephone Encounter (Signed)
LM w/ spouse for pt to call back

## 2011-06-25 NOTE — Telephone Encounter (Signed)
LMOMTCB x 1 

## 2011-06-26 NOTE — Telephone Encounter (Signed)
CY, pt is already on 14 cwp.  Please advise.  Thanks.

## 2011-06-26 NOTE — Telephone Encounter (Signed)
Sorry- increase CPAP to 16

## 2011-06-26 NOTE — Telephone Encounter (Signed)
Called and spoke with pt. Pt states his cpap machine is currently set at 14cm.  Pt states pressure is too low.  States he wakes up multiple times during the night having difficulty breathing with cpap pressure.  Pt would like an order sent to [facility] to increase his the pressure.  RA out of office today.  Will forward message to doc of the day to address.  CY, please advise.  Thanks.

## 2011-06-26 NOTE — Telephone Encounter (Signed)
OK to ask increase CPAP to 14 cwp. Dx OSA

## 2011-06-26 NOTE — Telephone Encounter (Signed)
Lillia Abed calling back about cpap pressure.  Call back @ [phone]

## 2011-06-26 NOTE — Telephone Encounter (Signed)
LMOMTCB for both the pt and Lillia Abed with [facility]. An order has been placed and sent to the [facility] for [facility] to increase the pt's CPAP pressure to 16 per Dr. Maple Hudson.

## 2011-06-26 NOTE — Telephone Encounter (Signed)
[name] aware of cpap pressure change and that an order has been placed for this. Will await callback from the pt to make sure he is aware as well.

## 2011-06-27 NOTE — Telephone Encounter (Signed)
Left a detailed msg on pt's voicemail so he is aware that the order has been sent to [facility] for the increase in his cpap pressure. I asked that he call back only if he has any questions or concerns regarding this matter. He should expect a call from [facility] next week and instructed to call if he does not.

## 2011-07-11 ENCOUNTER — Telehealth: Payer: Self-pay | Admitting: Internal Medicine

## 2011-07-11 ENCOUNTER — Telehealth: Payer: Self-pay

## 2011-07-11 ENCOUNTER — Telehealth: Payer: Self-pay | Admitting: Pulmonary Disease

## 2011-07-11 DIAGNOSIS — M5431 Sciatica, right side: Secondary | ICD-10-CM

## 2011-07-11 MED ORDER — DOXYCYCLINE HYCLATE 100 MG PO TABS: ORAL_TABLET | ORAL | Status: DC

## 2011-07-11 MED ORDER — TRAMADOL HCL 50 MG PO TABS: 50.0000 mg | ORAL_TABLET | Freq: Four times a day (QID) | ORAL | Status: AC | PRN

## 2011-07-11 MED ORDER — PREDNISONE 10 MG PO TABS: ORAL_TABLET | ORAL | Status: DC

## 2011-07-11 NOTE — Telephone Encounter (Signed)
Pt called stating he has been experiencing an increase in sciatic pain. Pt is requesting a referral.

## 2011-07-11 NOTE — Telephone Encounter (Signed)
Called spoke with patient who c/o prod cough with yellow/gray/brown mucus, wheezing, increased SOB, sweats x1day.  Last ov 10.25.12.  Requesting rx.  [facility].  Dr Marchelle Gearing please advise, thanks.  No Known Allergies

## 2011-07-11 NOTE — Telephone Encounter (Signed)
Called spoke with patient, advised of MR's recs.  Pt verbalized his understanding.  rx sent to verified pharmacy.

## 2011-07-11 NOTE — Telephone Encounter (Signed)
You have mild attack of copd called COPD exacerbation -Please take doxycycline 100mg twice daily after meals x 5 days; avoid sunlight -Please take prednisone 40mg once daily x 3 days, then 20mg once daily x 3 days, then 10mg once daily x 3 days, then 5mg once dailyx 3 days and stop 

## 2011-07-11 NOTE — Telephone Encounter (Signed)
Please ask if he has seen neurosurgury (not neurology) or orthopedic in the past that he would want to see

## 2011-07-11 NOTE — Telephone Encounter (Signed)
Ok for tramadol prn, referral done per emr

## 2011-07-11 NOTE — Telephone Encounter (Signed)
Patient would like to be referred Dr. Otelia Sergeant. Also would like some pain medication called into [facility]

## 2011-07-11 NOTE — Telephone Encounter (Signed)
Yes capsules are okay.  Pharmacist is aware.  Will sign off.

## 2011-07-22 ENCOUNTER — Telehealth: Payer: Self-pay | Admitting: Pulmonary Disease

## 2011-07-22 NOTE — Telephone Encounter (Signed)
Download 11/19-12/11/12 shows good control of events on CPAP 16 cm, good usage

## 2011-07-24 NOTE — Telephone Encounter (Signed)
I informed pt of RA's findings and recommendations. Pt verbalized understanding  

## 2011-08-26 ENCOUNTER — Encounter: Payer: Self-pay | Admitting: Pulmonary Disease

## 2011-08-26 ENCOUNTER — Ambulatory Visit (INDEPENDENT_AMBULATORY_CARE_PROVIDER_SITE_OTHER): Payer: Medicare Other | Admitting: Pulmonary Disease

## 2011-08-26 DIAGNOSIS — G4733 Obstructive sleep apnea (adult) (pediatric): Secondary | ICD-10-CM

## 2011-08-26 DIAGNOSIS — J449 Chronic obstructive pulmonary disease, unspecified: Secondary | ICD-10-CM

## 2011-08-26 NOTE — Assessment & Plan Note (Signed)
Settled on 16 cm CPAP, has good compliance Weight loss encouraged, compliance with goal of at least 4-6 hrs every night is the expectation. Advised against medications with sedative side effects Cautioned against driving when sleepy - understanding that sleepiness will vary on a day to day basis

## 2011-08-26 NOTE — Progress Notes (Signed)
  Subjective:    Patient ID: [name], male    DOB: 05-Sep-1940, 71 y.o.   MRN: [id number]  HPI  66 yowm quit smoking around 2000 with GOLD III COPD on 3-4 L o2. Meds through [facility]  He was hospitalised in 10/12 for acute on chronic hypercarbic resp failure. He also has gr 2 diastolic dysfunction & improved with diuresis . He was statred on VPAP S auto on discharge [facility])  PSG 10/15 showed severe OSA, AHI 48/h with nadir desatn 78% correctd by CPAP 12 cm, 3 L O2 blended in , c flex +2 cm, humidity, A medium full face mask was used.  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 Desaturations persisted without resp events on CPAP 12 cm   08/26/2011  Download 11/19-12/11/12 shows good control of events on CPAP 16 cm, good usage He has finally settled down on 16 cm , feels well rested Breathing ok, last pred usage was in dec'12 Monitor wts daily & takes lasix for increase in 2lbs or more    Review of Systems Patient denies significant dyspnea,cough, hemoptysis,  chest pain, palpitations, pedal edema, orthopnea, paroxysmal nocturnal dyspnea, lightheadedness, nausea, vomiting, abdominal or  leg pains      Objective:   Physical Exam  Gen. Pleasant, well-nourished, in no distress, normal affect ENT - no lesions, no post nasal drip Neck: No JVD, no thyromegaly, no carotid bruits Lungs: no use of accessory muscles, no dullness to percussion, clear without rales or rhonchi  Cardiovascular: Rhythm regular, heart sounds  normal, no murmurs or gallops, no peripheral edema Abdomen: soft and non-tender, no hepatosplenomegaly, BS normal. Musculoskeletal: No deformities, no cyanosis or clubbing Neuro:  alert, non focal       Assessment & Plan:

## 2011-08-26 NOTE — Assessment & Plan Note (Signed)
Ct current regimen of spiriva, foradil & asmanex Completed pulm rehab - on maintenance program

## 2011-08-26 NOTE — Patient Instructions (Signed)
Stay on CPAP 16 cm with oxygen Stay on spiriva, asmanex, foradil Monitor your weight for lasix dosing

## 2011-08-27 ENCOUNTER — Encounter: Payer: Self-pay | Admitting: Internal Medicine

## 2011-08-27 ENCOUNTER — Ambulatory Visit (INDEPENDENT_AMBULATORY_CARE_PROVIDER_SITE_OTHER): Payer: Medicare Other | Admitting: Internal Medicine

## 2011-08-27 VITALS — BP 122/64 | HR 87 | Temp 97.3°F | Ht 75.0 in | Wt 253.2 lb

## 2011-08-27 DIAGNOSIS — J449 Chronic obstructive pulmonary disease, unspecified: Secondary | ICD-10-CM

## 2011-08-27 DIAGNOSIS — R05 Cough: Secondary | ICD-10-CM

## 2011-08-27 DIAGNOSIS — J441 Chronic obstructive pulmonary disease with (acute) exacerbation: Secondary | ICD-10-CM

## 2011-08-27 MED ORDER — LEVOFLOXACIN 500 MG PO TABS: 500.0000 mg | ORAL_TABLET | Freq: Every day | ORAL | Status: AC

## 2011-08-27 MED ORDER — PREDNISONE 10 MG PO TABS: ORAL_TABLET | ORAL | Status: DC

## 2011-08-27 NOTE — Progress Notes (Signed)
Subjective:    Patient ID: [name], male    DOB: [birth year], 71 y.o.   MRN: [id number]  HPI 89 yowm quit smoking around 2000 with GOLD III COPD by PFT's 09/2009. Meds through [facility]  June 18, 2010: EX 100 pack year smoker. Home O2 dependent Gold stage 3 COPD with low DLCO 35%  in PFTs MArch 2011. BAeline pulse ox is 87-93% on 2L [place]. In,  OCt 2011 Ct showing some UIP component but predmoninant COPD on CT. 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 Sherene Sires to myself.  In Oct 2011 had some mild AECOPD related hemoptysis with CT showing only UIP but not showing any mass. Since then hemoptysis resolved. Currently feels baseline on 2-3L [place]. Claudication lmits him more than dyspnea at 1/4 th mile. Denies chronic cough but on mucinex and feels it helps. On spiriva, foradil and asmanex through [facility]. Has not done rehab though wife is attending rehab. Denies chest pains, edema, wheezing, fever, colored sputum. REC: CHECK AUTOIMMUNE PROFILE,  START NAC, GET FULL PFTs   August 12, 2010: Followup. in interim was admitted for sBP12/19/2011 due to [facility] from prior laparotomies. Resolved with conservative Rx. Started pulmnary rehab 2 weeks ago. Attend 4 sesssions. It is helping so far and he likes it. No new complaints. Dyspnea is stable class 2-3. Denies associated cough, chest pain, hemoptysis, edema, sputum, weight loss. Had autoimmune profile 07/03/2010 - CRP 10, RF 352 but otherwise negative. Upon revealing labs he states he is known to have RA diagnosed over 10 years ago and was on enbrel. He is due to see a rheumatologist at [place], [place]. PFTs today continue to show Gold stage 3 COPD and somewhat improved from a year ago.  August 27, 2010 --Presents for an acute office visit.Complains of increased sob,  dry cough, wheezing. Pt states he doesn't feel like he is getting enough oxygen -cant get enough air. Retaining more fluid.  Denies chest pain, orthopnea, hemoptysis, fever, n/v/d,  headache,recent travel. RX: DOXY AND PRED   OV 02/21/2011: Admitted 7/22 through 7/26 for Acute-on-chronic hypoxemia, probably with chronic obstructive       pulmonary disease exacerbation, pneumonia, acute congestive heart      Failure, (presented with dyspnea, edema, cough). est x-ray on July 22 yield chronic lung changes without acute      overlying pulmonary process.  A 2-D echo yields an ejection fraction of 55% to 60% with grade 2      diastolic dysfunction.  Mitral valve with moderately calcified      Annulus. ( I personally reviewed thos results)  Moderately thickened leaflets. RX: DIURESIS, PRED, AVELOX and O2   Today  Followup from above hospitalization. States he no longer has worsening dyspnea, edema, sputum, cough, fever, chills. Back to baseline o2 need of 3L . However, feels fatigued today more than yesterday. Feels is due to benadryl which he is taking for insomnia. Was talking Remus Loffler but he was falling as a result and stopped. He has class 3 dyspnea at baseline. Has completed rehab.  Compliant with meds>>no changes  03/20/11  Acute OV  Complains of persistent leg swelling. Increased DOE with actiivity . Wears out easily. Lasix has been increased to 40mg  daily with limitedresponse. Weight trending up , now up 10 lbs over last month. Does have cards consult next week. Previous echo last month during hospital admit showed LV nml w/ grade 2 diastolic dysfnx. No chest pain ,  increaesed cough or congestion  >>Aldactone 25mg  Twice daily  rx   04/29/2011 Follow up  Pt returns for follow up. Last ov with increased leg swelling despite lasix 40mg . Aldactone 25mg  Twice daily was added to his regimen. He says this did help some however was taken off per cardiology recommendations. He says leg swelling is about the same.  Has gotten some TEDS stocking to wear daily .   Does complain of increased congestion for last few days with green mucus.  Wife was recently tx for bronchitis. NO fever or hemoptysis. No recent abx use. Taking spiriva , foradil and asmanex without known difficulty-gets meds via [place] system.  >>STOP ALDACTONE per DR Nelda Severe  05/09/2011 Post hospital  Pt returns for close follow up from hospitalization. Admitted 10/7-10/11 for acute on chronic resp failure w/ ILD flare, CHF w/ vol overload, and possible CAP along w/ supsected OSA /hypercarbia.  He was tx w/ initial BIPAP w/ transition to nocturnal BIPAP for suspected OSA w/  Associated hypercarbia. Along w/ pulmonary hygiene, IV abx and steroids. HE was aggressively  Diuresised w/ improvement in vol overload. Today bmet  shows improved renal insufficiency .  XRAy showed increased consolidation predominately in bases.  He returns today improved w/ weight loss of >10lbs w/ substantial decreased in edema.  Feels his breathing is back to his baseline.  No wheezing or hemoptysis. No chest pain or fever.  >>pred taper , lasix 40mg  , sleep study and referral to [facility]  OV 05/22/11: Followup for above. Recent AECOPD. Suspected OSA; sent hom on bipap/cpap which he says is helping sleep quality and wakefulness immensely. Thinks sleep study is abnromal. He is due to see Dr Vassie Loll soon for it. Denies resp discoomfort. Still bothered by cosmetic effects of edema. He is frustrated that despite lasix and fluid restriction it still persists (has varicose veins). Advised him to see what happens with OSA Rx. He also wants to know how to prevent AECOPD >>  06/11/11 Acute OV  Complains of increased SOB, prod cough with clear mucus, body aches, weight increase of 4# x2days. Has increaesed edema and weight is up 4 lbs. He is more dyspneic with activity. We reviewed his meds . He did eat quite a bit of salty food yesterday. We discussed low salt diet.  Wants to catch his breathing before it gets too bad. No discolored mucus or fever.   REC Increase Lasix 40mg  1 extra tablet for next 2 days  Low  salt diet  Legs elevated.  Follow up Dr. Vassie Loll And Marchelle Gearing As planned  Please contact office for sooner follow up if symptoms do not improve or worsen or seek emergency care   OV 08/27/2011 Called in for AECOPD RX 07/11/11. Saw Dr Vassie Loll for sleep 08/06/11 and felt he was doing well on CPAP 16. Today reports 3 days increased dyspnea; was walking around the block with his 3L o2 but now able to hardly do mailbox and back. Slight associated increase in wheeze and sputum volume but no change in sputum color or cough. Feels like he is in AECOPD Denies fever, chest pain, or edema worsening. Spirometry today: Fev1 1.3L/30% predicted, ratio 48. He is interested in lung transplant. Says a friend who is diabetic got transplanted for COPD at [facility] and is doing well. Says he recollects me telling him that he is a poor lung transplant candidate (I do not remember that) and that his hope is being taken away  Past, Family, Social reviewed: no change  since last visit except as in HPI    Review of Systems  Constitutional: Negative for fever and unexpected weight change.  HENT: Negative for ear pain, nosebleeds, congestion, sore throat, rhinorrhea, sneezing, trouble swallowing, dental problem, postnasal drip and sinus pressure.   Eyes: Negative for redness and itching.  Respiratory: Positive for shortness of breath. Negative for cough, chest tightness and wheezing.   Cardiovascular: Negative for palpitations and leg swelling.  Gastrointestinal: Negative for nausea and vomiting.  Genitourinary: Negative for dysuria.  Musculoskeletal: Negative for joint swelling.  Skin: Negative for rash.  Neurological: Negative for headaches.  Hematological: Does not bruise/bleed easily.  Psychiatric/Behavioral: Negative for dysphoric mood. The patient is not nervous/anxious.        Objective:   Physical Exam GEN: A/Ox3; pleasant , NAD, chronically ill appearing and elderly   HEENT:  [place]/AT,  EACs-clear, TMs-wnl, NOSE-clear,  THROAT-clear, no lesions, no postnasal drip or exudate noted.   NECK:  Supple w/ fair ROM; no JVD;  carotid impulses w/o bruits; no thyromegaly or nodules palpated; no lymphadenopathy.  RESP  Coarse BS  MILD SCATTERED WHEEZE +.no accessory muscle use, no dullness to percussion  CARD:  RRR, no m/r/g  , 1+  peripheral edema bilaterally , pulses intact, no cyanosis or clubbing.  GI:   Soft & nt; nml bowel sounds; no organomegaly or masses detected.  Musco: Warm bil, no deformities or joint swelling noted.   Neuro: alert, no focal deficits noted.    Skin: Warm, no lesions or rashes           Assessment & Plan:

## 2011-08-27 NOTE — Patient Instructions (Addendum)
You have mild attack of copd called COPD exacerbation Please take levaquin500 mg once daily x 6days; avoid sunlight Please take prednisone 40mg  once daily x 3 days, then 20mg  once daily x 3 days, then 10mg  once daily x 3 days, then 5mg  once dailyx 3 days and stop Check alpha 1 today REturn in 1 month with full PFT I will touch base with [facility] Transplant team for a pre-evaluation

## 2011-08-28 ENCOUNTER — Encounter: Payer: Self-pay | Admitting: Internal Medicine

## 2011-08-28 ENCOUNTER — Telehealth: Payer: Self-pay | Admitting: Internal Medicine

## 2011-08-28 DIAGNOSIS — J441 Chronic obstructive pulmonary disease with (acute) exacerbation: Secondary | ICD-10-CM | POA: Insufficient documentation

## 2011-08-28 NOTE — Assessment & Plan Note (Signed)
FEv1 on 08/27/11 is 30% predicted and is a decline from Jan 2012 (fev1 48%) and could reflect aecopd currently. So, will reassess in 1 month with full PFT. I will try to get a lung transplant pre-eval done at [facility] at his request and get back to him

## 2011-08-28 NOTE — Assessment & Plan Note (Signed)
You have mild attack of copd called COPD exacerbation Please take levaquin500 mg once daily x 6days; avoid sunlight Please take prednisone 40mg  once daily x 3 days, then 20mg  once daily x 3 days, then 10mg  once daily x 3 days, then 5mg  once dailyx 3 days and stop

## 2011-08-28 NOTE — Telephone Encounter (Signed)
pls get hold of [facility] transplant coordinator and ask abou stesp needed for a transplant pre-eval. Then do the needful. IF she wants to ttalk to me, pls give her my number  Thanks MR

## 2011-08-29 NOTE — Telephone Encounter (Signed)
I called [facility] and gave MR cell # and pt info. They state they will have Dr. Vear Clock call MR to discuss. Carron Curie, CMA

## 2011-09-01 ENCOUNTER — Telehealth: Payer: Self-pay | Admitting: Internal Medicine

## 2011-09-01 DIAGNOSIS — J8410 Pulmonary fibrosis, unspecified: Secondary | ICD-10-CM

## 2011-09-01 NOTE — Telephone Encounter (Signed)
Got a call from Acquanetta Belling from [facility] pre-transplant. Please expect a call for records as part of pre-transplant eval. They want to review records first. Gave then office number and your name. They will contact patient after reviewing records

## 2011-09-01 NOTE — Telephone Encounter (Signed)
Spoke with Asherel at [facility] (Dr. Acquanetta Belling) and she is requesting all records from the past year be faxed for review to [phone]. Order placed to [facility] so this can be taken care of.

## 2011-09-01 NOTE — Telephone Encounter (Signed)
Spoke, see other phone note. Closing this one

## 2011-09-09 DIAGNOSIS — H15059 Scleromalacia perforans, unspecified eye: Secondary | ICD-10-CM | POA: Insufficient documentation

## 2011-09-09 DIAGNOSIS — M069 Rheumatoid arthritis, unspecified: Secondary | ICD-10-CM | POA: Insufficient documentation

## 2011-09-10 ENCOUNTER — Telehealth: Payer: Self-pay

## 2011-09-10 NOTE — Telephone Encounter (Signed)
Dr. Laruth Bouchard office caledl to inform the patient is scheduled to have Cataract surgery 10/06/11.  The patient has seen a retinal specialist who has recommended the patient be on prednisone 30 mg daily 1 week before his surgery and 1 week after. Dr. Laruth Bouchard office will fax this information to the patients PCP asap for a response on all of the above information. Please advise as awaiting PCP's recommendation on the prednisone.

## 2011-09-10 NOTE — Telephone Encounter (Signed)
Pt is also requesting a call back regarding same.

## 2011-09-11 ENCOUNTER — Telehealth: Payer: Self-pay

## 2011-09-11 NOTE — Telephone Encounter (Signed)
Patient informed of MD's instructions

## 2011-09-11 NOTE — Telephone Encounter (Signed)
A user error has taken place: encounter opened in error, closed for administrative reasons.

## 2011-09-11 NOTE — Telephone Encounter (Signed)
The patient is taking Novolog sliding scale 16 units before breakfast, 12 units before lunch and 24 units before diner.  The Lantus he is taking 80 units qhs. His CBG's are below 100 in the am, 120 at lunch and before the evening meal 130-140. Informed ok to take the prednisone. Please advise

## 2011-09-11 NOTE — Telephone Encounter (Signed)
Ok for pt to address with endo at [facility]

## 2011-09-11 NOTE — Telephone Encounter (Signed)
Ok to take the prednisone, but he will need increase insulin to treat the sugar being increased as well  Please ask pt his current dosing of BOTH insulins he usually takes, and his most recent CBG's, such in the past 3-5 days

## 2011-09-11 NOTE — Telephone Encounter (Signed)
Patient stated he is seeing his MD at the [facility] on 09/16/11.

## 2011-09-23 ENCOUNTER — Ambulatory Visit (INDEPENDENT_AMBULATORY_CARE_PROVIDER_SITE_OTHER): Payer: Medicare Other | Admitting: Internal Medicine

## 2011-09-23 ENCOUNTER — Encounter: Payer: Self-pay | Admitting: Internal Medicine

## 2011-09-23 VITALS — BP 120/72 | HR 91 | Ht 74.0 in | Wt 248.0 lb

## 2011-09-23 DIAGNOSIS — J449 Chronic obstructive pulmonary disease, unspecified: Secondary | ICD-10-CM

## 2011-09-23 DIAGNOSIS — R05 Cough: Secondary | ICD-10-CM

## 2011-09-23 LAB — PULMONARY FUNCTION TEST

## 2011-09-23 NOTE — Progress Notes (Signed)
Subjective:    Patient ID: [name], male    DOB: 1941-05-28, 71 y.o.   MRN: [id number]  HPI 89 yowm quit smoking around 2000 with GOLD III COPD by PFT's 09/2009. Meds through [facility]  June 18, 2010: EX 100 pack year smoker. Home O2 dependent Gold stage 3 COPD with low DLCO 35%  in PFTs MArch 2011. BAeline pulse ox is 87-93% on 2L [place]. In,  OCt 2011 Ct showing some UIP component but predmoninant COPD on CT. 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 Sherene Sires to myself.  In Oct 2011 had some mild AECOPD related hemoptysis with CT showing only UIP but not showing any mass. Since then hemoptysis resolved. Currently feels baseline on 2-3L [place]. Claudication lmits him more than dyspnea at 1/4 th mile. Denies chronic cough but on mucinex and feels it helps. On spiriva, foradil and asmanex through [facility]. Has not done rehab though wife is attending rehab. Denies chest pains, edema, wheezing, fever, colored sputum. REC: CHECK AUTOIMMUNE PROFILE,  START NAC, GET FULL PFTs   August 12, 2010: Followup. in interim was admitted for sBP12/19/2011 due to [facility] from prior laparotomies. Resolved with conservative Rx. Started pulmnary rehab 2 weeks ago. Attend 4 sesssions. It is helping so far and he likes it. No new complaints. Dyspnea is stable class 2-3. Denies associated cough, chest pain, hemoptysis, edema, sputum, weight loss. Had autoimmune profile 07/03/2010 - CRP 10, RF 352 but otherwise negative. Upon revealing labs he states he is known to have RA diagnosed over 10 years ago and was on enbrel. He is due to see a rheumatologist at [place], [place]. PFTs today continue to show Gold stage 3 COPD and somewhat improved from a year ago.  August 27, 2010 --Presents for an acute office visit.Complains of increased sob,  dry cough, wheezing. Pt states he doesn't feel like he is getting enough oxygen -cant get enough air. Retaining more fluid.  Denies chest pain, orthopnea, hemoptysis, fever, n/v/d,  headache,recent travel. RX: DOXY AND PRED   OV 02/21/2011: Admitted 7/22 through 7/26 for Acute-on-chronic hypoxemia, probably with chronic obstructive       pulmonary disease exacerbation, pneumonia, acute congestive heart      Failure, (presented with dyspnea, edema, cough). est x-ray on July 22 yield chronic lung changes without acute      overlying pulmonary process.  A 2-D echo yields an ejection fraction of 55% to 60% with grade 2      diastolic dysfunction.  Mitral valve with moderately calcified      Annulus. ( I personally reviewed thos results)  Moderately thickened leaflets. RX: DIURESIS, PRED, AVELOX and O2   Today  Followup from above hospitalization. States he no longer has worsening dyspnea, edema, sputum, cough, fever, chills. Back to baseline o2 need of 3L [place]. However, feels fatigued today more than yesterday. Feels is due to benadryl which he is taking for insomnia. Was talking Remus Loffler but he was falling as a result and stopped. He has class 3 dyspnea at baseline. Has completed rehab.  Compliant with meds>>no changes  03/20/11  Acute OV  Complains of persistent leg swelling. Increased DOE with actiivity . Wears out easily. Lasix has been increased to 40mg  daily with limitedresponse. Weight trending up , now up 10 lbs over last month. Does have cards consult next week. Previous echo last month during hospital admit showed LV nml w/ grade 2 diastolic dysfnx. No chest pain ,  increaesed cough or congestion  >>Aldactone 25mg  Twice daily  rx   04/29/2011 Follow up  Pt returns for follow up. Last ov with increased leg swelling despite lasix 40mg . Aldactone 25mg  Twice daily was added to his regimen. He says this did help some however was taken off per cardiology recommendations. He says leg swelling is about the same.  Has gotten some TEDS stocking to wear daily .   Does complain of increased congestion for last few days with green mucus.  Wife was recently tx for bronchitis. NO fever or hemoptysis. No recent abx use. Taking spiriva , foradil and asmanex without known difficulty-gets meds via [place] system.  >>STOP ALDACTONE per DR Nelda Severe  05/09/2011 Post hospital  Pt returns for close follow up from hospitalization. Admitted 10/7-10/11 for acute on chronic resp failure w/ ILD flare, CHF w/ vol overload, and possible CAP along w/ supsected OSA /hypercarbia.  He was tx w/ initial BIPAP w/ transition to nocturnal BIPAP for suspected OSA w/  Associated hypercarbia. Along w/ pulmonary hygiene, IV abx and steroids. HE was aggressively  Diuresised w/ improvement in vol overload. Today bmet  shows improved renal insufficiency .  XRAy showed increased consolidation predominately in bases.  He returns today improved w/ weight loss of >10lbs w/ substantial decreased in edema.  Feels his breathing is back to his baseline.  No wheezing or hemoptysis. No chest pain or fever.  >>pred taper , lasix 40mg  , sleep study and referral to [facility]  OV 05/22/11: Followup for above. Recent AECOPD. Suspected OSA; sent hom on bipap/cpap which he says is helping sleep quality and wakefulness immensely. Thinks sleep study is abnromal. He is due to see Dr Vassie Loll soon for it. Denies resp discoomfort. Still bothered by cosmetic effects of edema. He is frustrated that despite lasix and fluid restriction it still persists (has varicose veins). Advised him to see what happens with OSA Rx. He also wants to know how to prevent AECOPD >>  06/11/11 Acute OV  Complains of increased SOB, prod cough with clear mucus, body aches, weight increase of 4# x2days. Has increaesed edema and weight is up 4 lbs. He is more dyspneic with activity. We reviewed his meds . He did eat quite a bit of salty food yesterday. We discussed low salt diet.  Wants to catch his breathing before it gets too bad. No discolored mucus or fever.   REC Increase Lasix 40mg  1 extra tablet for next 2 days  Low  salt diet  Legs elevated.  Follow up Dr. Vassie Loll And Marchelle Gearing As planned  Please contact office for sooner follow up if symptoms do not improve or worsen or seek emergency care   OV 08/27/2011 Called in for AECOPD RX 07/11/11. Saw Dr Vassie Loll for sleep 08/06/11 and felt he was doing well on CPAP 16. Today reports 3 days increased dyspnea; was walking around the block with his 3L o2 but now able to hardly do mailbox and back. Slight associated increase in wheeze and sputum volume but no change in sputum color or cough. Feels like he is in AECOPD Denies fever, chest pain, or edema worsening. Spirometry today: Fev1 1.3L/30% predicted, ratio 48. He is interested in lung transplant. Says a friend who is diabetic got transplanted for COPD at [facility] and is doing well. Says he recollects me telling him that he is a poor lung transplant candidate (I do not remember that) and that his hope is being taken away  Past, Family, Social reviewed: no change  since last visit except as in HPI   REC You have mild attack of copd called COPD exacerbation  Please take levaquin500 mg once daily x 6days; avoid sunlight  Please take prednisone 40mg  once daily x 3 days, then 20mg  once daily x 3 days, then 10mg  once daily x 3 days, then 5mg  once dailyx 3 days and stop  Check alpha 1 today  REturn in 1 month with full PFT  I will touch base with [facility] Transplant team for a pre-evaluation  OV 09/23/2011 PFTS 09/23/2011 shows gold stage 3 copd. fev1 1.55L/47%, Ratio 45, TLC 7L/95%, DLCO 10/38%, 8% BD response in fev1. This is improved 250cc in fev1 compared to last visit when he was in AECOPD. Currently well and baseline. After last OV I spoke t [facility] pre-transplant evaluation MD who has requested his charts. I believe my office should have sent them these. Patient has not heard from them.  Past, Family, Social reviewed: no change since last visit       Review of Systems  Constitutional: Negative for fever and unexpected weight  change.  HENT: Negative for ear pain, nosebleeds, congestion, sore throat, rhinorrhea, sneezing, trouble swallowing, dental problem, postnasal drip and sinus pressure.   Eyes: Negative for redness and itching.  Respiratory: Negative for cough, chest tightness, shortness of breath and wheezing.   Cardiovascular: Negative for palpitations and leg swelling.  Gastrointestinal: Negative for nausea and vomiting.  Genitourinary: Negative for dysuria.  Musculoskeletal: Negative for joint swelling.  Skin: Negative for rash.  Neurological: Negative for headaches.  Hematological: Does not bruise/bleed easily.  Psychiatric/Behavioral: Negative for dysphoric mood. The patient is not nervous/anxious.        Objective:   Physical Exam  GEN: A/Ox3; pleasant , NAD, chronically ill appearing and elderly   HEENT:  [place]/AT,  EACs-clear, TMs-wnl, NOSE-clear, THROAT-clear, no lesions, no postnasal drip or exudate noted.   NECK:  Supple w/ fair ROM; no JVD;  carotid impulses w/o bruits; no thyromegaly or nodules palpated; no lymphadenopathy.  RESP  Coarse BS  NO WHEEZE +.no accessory muscle use, no dullness to percussion  CARD:  RRR, no m/r/g  , 1+  peripheral edema bilaterally , pulses intact, no cyanosis or clubbing.  GI:   Soft & nt; nml bowel sounds; no organomegaly or masses detected.  Musco: Warm bil, no deformities or joint swelling noted.   Neuro: alert, no focal deficits noted.    Skin: Warm, no lesions or rashes        Assessment & Plan:

## 2011-09-23 NOTE — Progress Notes (Signed)
PFT done today. 

## 2011-09-23 NOTE — Patient Instructions (Signed)
You have severe copd  THe lung function is improved from last visit and baseline today at 47-50% of capacity Continue oxygen and medications Nurse will ensure that referral paper work was received by [facility] transplant team for their review Return in 6 months or sooner if needed  

## 2011-09-29 ENCOUNTER — Encounter: Payer: Self-pay | Admitting: Internal Medicine

## 2011-09-29 NOTE — Assessment & Plan Note (Signed)
You have severe copd  THe lung function is improved from last visit and baseline today at 47-50% of capacity Continue oxygen and medications Nurse will ensure that referral paper work was received by [facility] transplant team for their review Return in 6 months or sooner if needed

## 2011-10-07 ENCOUNTER — Telehealth: Payer: Self-pay | Admitting: Internal Medicine

## 2011-10-07 NOTE — Telephone Encounter (Signed)
Called and spoke with pt. Pt c/o head congestion, body aches, coughing up gray to brown colored sputum and increased sob.   MR had opening today at 12:00 but pt stated he already has an appt with another md at that time.  Offered pt an appt with MR tomorrow at 10:45 am. Pt agreed to come in tomorrow to be seen.  Pt was instructed to go to ER if symptoms worsened prior to appt tomorrow.

## 2011-10-08 ENCOUNTER — Ambulatory Visit (INDEPENDENT_AMBULATORY_CARE_PROVIDER_SITE_OTHER): Payer: Medicare Other | Admitting: Internal Medicine

## 2011-10-08 ENCOUNTER — Encounter: Payer: Self-pay | Admitting: Internal Medicine

## 2011-10-08 VITALS — BP 138/64 | HR 75 | Temp 98.1°F | Ht 75.0 in | Wt 253.2 lb

## 2011-10-08 DIAGNOSIS — J449 Chronic obstructive pulmonary disease, unspecified: Secondary | ICD-10-CM

## 2011-10-08 MED ORDER — PREDNISONE 10 MG PO TABS: ORAL_TABLET | ORAL | Status: DC

## 2011-10-08 MED ORDER — METHYLPREDNISOLONE ACETATE PF 80 MG/ML IJ SUSP
80.0000 mg | Freq: Once | INTRAMUSCULAR | Status: AC
  Administered 2011-10-08: 80 mg via INTRAMUSCULAR

## 2011-10-08 MED ORDER — DOXYCYCLINE HYCLATE 100 MG PO TABS: 100.0000 mg | ORAL_TABLET | Freq: Two times a day (BID) | ORAL | Status: DC

## 2011-10-08 MED ORDER — LEVALBUTEROL HCL 0.63 MG/3ML IN NEBU
0.6300 mg | INHALATION_SOLUTION | Freq: Once | RESPIRATORY_TRACT | Status: AC
  Administered 2011-10-08: 0.63 mg via RESPIRATORY_TRACT

## 2011-10-08 NOTE — Assessment & Plan Note (Signed)
You have mild attack of copd called COPD exacerbation Please take doxycycline 100mg twice daily after meals x 7 days; avoid sunlight Please take prednisone 40mg once daily x 3 days, then 20mg once daily x 3 days, then 10mg once daily x 3 days, then 5mg once dailyx 3 days and stop  - this is ok with DR Groat because this regimen should end around 3/26 but please inform your [facility] MD of this; will send letter to him as well After April 1st, start roflumilast (samples, take 45 number) 1 tablet daily after food  - watch out for GI side effects, IF they develop take 1/2 tablet a day or 1 tablet every other day for a week and then go back to 1 tablet a day Go to ER if worse Followup 3 weeks or sooner if there are problems 

## 2011-10-08 NOTE — Patient Instructions (Addendum)
You have mild attack of copd called COPD exacerbation Please take doxycycline 100mg  twice daily after meals x 7 days; avoid sunlight Please take prednisone 40mg  once daily x 3 days, then 20mg  once daily x 3 days, then 10mg  once daily x 3 days, then 5mg  once dailyx 3 days and stop  - this is ok with DR Dione Booze because this regimen should end around 3/26 but please inform your [facility] MD of this; will send letter to him as well After April 1st, start roflumilast (samples, take 45 number) 1 tablet daily after food  - watch out for GI side effects, IF they develop take 1/2 tablet a day or 1 tablet every other day for a week and then go back to 1 tablet a day Go to ER if worse Followup 3 weeks or sooner if there are problems

## 2011-10-08 NOTE — Progress Notes (Signed)
Subjective:    Patient ID: [name], male    DOB: 1941-05-28, 71 y.o.   MRN: [id number]  HPI 89 yowm quit smoking around 2000 with GOLD III COPD by PFT's 09/2009. Meds through [facility]  June 18, 2010: EX 100 pack year smoker. Home O2 dependent Gold stage 3 COPD with low DLCO 35%  in PFTs MArch 2011. BAeline pulse ox is 87-93% on 2L [place]. In,  OCt 2011 Ct showing some UIP component but predmoninant COPD on CT. 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 Sherene Sires to myself.  In Oct 2011 had some mild AECOPD related hemoptysis with CT showing only UIP but not showing any mass. Since then hemoptysis resolved. Currently feels baseline on 2-3L [place]. Claudication lmits him more than dyspnea at 1/4 th mile. Denies chronic cough but on mucinex and feels it helps. On spiriva, foradil and asmanex through [facility]. Has not done rehab though wife is attending rehab. Denies chest pains, edema, wheezing, fever, colored sputum. REC: CHECK AUTOIMMUNE PROFILE,  START NAC, GET FULL PFTs   August 12, 2010: Followup. in interim was admitted for sBP12/19/2011 due to [facility] from prior laparotomies. Resolved with conservative Rx. Started pulmnary rehab 2 weeks ago. Attend 4 sesssions. It is helping so far and he likes it. No new complaints. Dyspnea is stable class 2-3. Denies associated cough, chest pain, hemoptysis, edema, sputum, weight loss. Had autoimmune profile 07/03/2010 - CRP 10, RF 352 but otherwise negative. Upon revealing labs he states he is known to have RA diagnosed over 10 years ago and was on enbrel. He is due to see a rheumatologist at [place], [place]. PFTs today continue to show Gold stage 3 COPD and somewhat improved from a year ago.  August 27, 2010 --Presents for an acute office visit.Complains of increased sob,  dry cough, wheezing. Pt states he doesn't feel like he is getting enough oxygen -cant get enough air. Retaining more fluid.  Denies chest pain, orthopnea, hemoptysis, fever, n/v/d,  headache,recent travel. RX: DOXY AND PRED   OV 02/21/2011: Admitted 7/22 through 7/26 for Acute-on-chronic hypoxemia, probably with chronic obstructive       pulmonary disease exacerbation, pneumonia, acute congestive heart      Failure, (presented with dyspnea, edema, cough). est x-ray on July 22 yield chronic lung changes without acute      overlying pulmonary process.  A 2-D echo yields an ejection fraction of 55% to 60% with grade 2      diastolic dysfunction.  Mitral valve with moderately calcified      Annulus. ( I personally reviewed thos results)  Moderately thickened leaflets. RX: DIURESIS, PRED, AVELOX and O2   Today  Followup from above hospitalization. States he no longer has worsening dyspnea, edema, sputum, cough, fever, chills. Back to baseline o2 need of 3L [place]. However, feels fatigued today more than yesterday. Feels is due to benadryl which he is taking for insomnia. Was talking Remus Loffler but he was falling as a result and stopped. He has class 3 dyspnea at baseline. Has completed rehab.  Compliant with meds>>no changes  03/20/11  Acute OV  Complains of persistent leg swelling. Increased DOE with actiivity . Wears out easily. Lasix has been increased to 40mg  daily with limitedresponse. Weight trending up , now up 10 lbs over last month. Does have cards consult next week. Previous echo last month during hospital admit showed LV nml w/ grade 2 diastolic dysfnx. No chest pain ,  increaesed cough or congestion  >>Aldactone 25mg  Twice daily  rx   04/29/2011 Follow up  Pt returns for follow up. Last ov with increased leg swelling despite lasix 40mg . Aldactone 25mg  Twice daily was added to his regimen. He says this did help some however was taken off per cardiology recommendations. He says leg swelling is about the same.  Has gotten some TEDS stocking to wear daily .   Does complain of increased congestion for last few days with green mucus.  Wife was recently tx for bronchitis. NO fever or hemoptysis. No recent abx use. Taking spiriva , foradil and asmanex without known difficulty-gets meds via [place] system.  >>STOP ALDACTONE per DR Nelda Severe  05/09/2011 Post hospital  Pt returns for close follow up from hospitalization. Admitted 10/7-10/11 for acute on chronic resp failure w/ ILD flare, CHF w/ vol overload, and possible CAP along w/ supsected OSA /hypercarbia.  He was tx w/ initial BIPAP w/ transition to nocturnal BIPAP for suspected OSA w/  Associated hypercarbia. Along w/ pulmonary hygiene, IV abx and steroids. HE was aggressively  Diuresised w/ improvement in vol overload. Today bmet  shows improved renal insufficiency .  XRAy showed increased consolidation predominately in bases.  He returns today improved w/ weight loss of >10lbs w/ substantial decreased in edema.  Feels his breathing is back to his baseline.  No wheezing or hemoptysis. No chest pain or fever.  >>pred taper , lasix 40mg  , sleep study and referral to [facility]  OV 05/22/11: Followup for above. Recent AECOPD. Suspected OSA; sent hom on bipap/cpap which he says is helping sleep quality and wakefulness immensely. Thinks sleep study is abnromal. He is due to see Dr Vassie Loll soon for it. Denies resp discoomfort. Still bothered by cosmetic effects of edema. He is frustrated that despite lasix and fluid restriction it still persists (has varicose veins). Advised him to see what happens with OSA Rx. He also wants to know how to prevent AECOPD >>  06/11/11 Acute OV  Complains of increased SOB, prod cough with clear mucus, body aches, weight increase of 4# x2days. Has increaesed edema and weight is up 4 lbs. He is more dyspneic with activity. We reviewed his meds . He did eat quite a bit of salty food yesterday. We discussed low salt diet.  Wants to catch his breathing before it gets too bad. No discolored mucus or fever.   REC Increase Lasix 40mg  1 extra tablet for next 2 days  Low  salt diet  Legs elevated.  Follow up Dr. Vassie Loll And Marchelle Gearing As planned  Please contact office for sooner follow up if symptoms do not improve or worsen or seek emergency care   OV 08/27/2011 Called in for AECOPD RX 07/11/11. Saw Dr Vassie Loll for sleep 08/06/11 and felt he was doing well on CPAP 16. Today reports 3 days increased dyspnea; was walking around the block with his 3L o2 but now able to hardly do mailbox and back. Slight associated increase in wheeze and sputum volume but no change in sputum color or cough. Feels like he is in AECOPD Denies fever, chest pain, or edema worsening. Spirometry today: Fev1 1.3L/30% predicted, ratio 48. He is interested in lung transplant. Says a friend who is diabetic got transplanted for COPD at [facility] and is doing well. Says he recollects me telling him that he is a poor lung transplant candidate (I do not remember that) and that his hope is being taken away  Past, Family, Social reviewed: no change  since last visit except as in HPI   REC You have mild attack of copd called COPD exacerbation  Please take levaquin500 mg once daily x 6days; avoid sunlight  Please take prednisone 40mg  once daily x 3 days, then 20mg  once daily x 3 days, then 10mg  once daily x 3 days, then 5mg  once dailyx 3 days and stop  Check alpha 1 today  REturn in 1 month with full PFT  I will touch base with [facility] Transplant team for a pre-evaluation  OV 09/23/2011 PFTS 09/23/2011 shows gold stage 3 copd. fev1 1.55L/47%, Ratio 45, TLC 7L/95%, DLCO 10/38%, 8% BD response in fev1. This is improved 250cc in fev1 compared to last visit when he was in AECOPD. Currently well and baseline. After last OV I spoke t [facility] pre-transplant evaluation MD who has requested his charts. I believe my office should have sent them these. Patient has not heard from them.  Past, Family, Social reviewed: no change since last visit  REC You have severe copd  THe lung function is improved from last visit and baseline  today at 47-50% of capacity  Continue oxygen and medications  Nurse will ensure that referral paper work was received by [facility] transplant team for their review  Return in 6 months or sooner if needed  OV 10/08/2011 [facility] VISIT.   Past 2 days is havig increased chest congestion, runny nose, runny eyes, cold, wheeze, cough and yellow sputum and dyspnea all c/w prior AECOPD. Rates it as moderate severity but not severe enough he needs admission. Denies edema, chest pain, syncope  Past, Family, Social reviewed: He had rt eye surgery by Dr Dione Booze and is currently on prednisone 30mg  (day 4) through 3/18 and then off, and restarts on 3/25 for surgery April on left eye on April 1st. I d/w Dr Dione Booze who says steroids based on [facility] for prevention of scleritis. Dr Dione Booze ok with pred taper through 10/22/11 for AECOPD   Review of Systems  Constitutional: Negative for fever and unexpected weight change.  HENT: Positive for congestion and sneezing. Negative for ear pain, nosebleeds, sore throat, rhinorrhea, trouble swallowing, dental problem, postnasal drip and sinus pressure.   Eyes: Negative for redness and itching.  Respiratory: Positive for cough, chest tightness and shortness of breath. Negative for wheezing.   Cardiovascular: Negative for palpitations and leg swelling.  Gastrointestinal: Negative for nausea and vomiting.  Genitourinary: Negative for dysuria.  Musculoskeletal: Negative for joint swelling.  Skin: Negative for rash.  Neurological: Negative for headaches.  Hematological: Does not bruise/bleed easily.  Psychiatric/Behavioral: Negative for dysphoric mood. The patient is not nervous/anxious.        Objective:   Physical Exam  Nursing note and vitals reviewed. Constitutional: He is oriented to person, place, and time. He appears well-developed and well-nourished. No distress.       Body mass index is 31.65 kg/(m^2). Looks somewhat unwell  HENT:  Head: Normocephalic and  atraumatic.  Right Ear: External ear normal.  Left Ear: External ear normal.  Mouth/Throat: Oropharynx is clear and moist. No oropharyngeal exudate.  Eyes: Conjunctivae and EOM are normal. Pupils are equal, round, and reactive to light. Right eye exhibits no discharge. Left eye exhibits no discharge. No scleral icterus.  Neck: Normal range of motion. Neck supple. No JVD present. No tracheal deviation present. No thyromegaly present.  Cardiovascular: Normal rate, regular rhythm and intact distal pulses.  Exam reveals no gallop and no friction rub.   No murmur heard.  Pulmonary/Chest: Effort normal. No respiratory distress. He has wheezes. He has no rales. He exhibits no tenderness.       Purse lip breathing - worse than before but able to complete sentences No cyanosis No acc muscle use Scattered wheeze  Abdominal: Soft. Bowel sounds are normal. He exhibits no distension and no mass. There is no tenderness. There is no rebound and no guarding.  Musculoskeletal: Normal range of motion. He exhibits no edema and no tenderness.  Lymphadenopathy:    He has no cervical adenopathy.  Neurological: He is alert and oriented to person, place, and time. He has normal reflexes. No cranial nerve deficit. Coordination normal.  Skin: Skin is warm and dry. No rash noted. He is not diaphoretic. No erythema. No pallor.  Psychiatric: He has a normal mood and affect. His behavior is normal. Judgment and thought content normal.          Assessment & Plan:

## 2011-10-09 ENCOUNTER — Telehealth: Payer: Self-pay | Admitting: Internal Medicine

## 2011-10-09 MED ORDER — ALBUTEROL SULFATE HFA 108 (90 BASE) MCG/ACT IN AERS: 2.0000 | INHALATION_SPRAY | RESPIRATORY_TRACT | Status: DC | PRN

## 2011-10-09 NOTE — Telephone Encounter (Signed)
Rx has been sent to the pharmacy and pt is aware

## 2011-10-10 ENCOUNTER — Telehealth: Payer: Self-pay | Admitting: Internal Medicine

## 2011-10-10 MED ORDER — ALBUTEROL SULFATE (2.5 MG/3ML) 0.083% IN NEBU: INHALATION_SOLUTION | RESPIRATORY_TRACT | Status: DC

## 2011-10-10 MED ORDER — COMPRESSOR/NEBULIZER MISC: Status: DC

## 2011-10-10 NOTE — Telephone Encounter (Signed)
Ok sorry. He is already on spiriva, formoterol and mometasone. So what he needs is generic albuterol neb q6h prn. Give 15 day suppply worht to last 1 month. 1 refill

## 2011-10-10 NOTE — Telephone Encounter (Signed)
Ok to send nebb machine order and neb solution

## 2011-10-10 NOTE — Telephone Encounter (Signed)
I spoke with [name] and advised him we did not have any neb solution on pt med list. He stated pt stated he told MR about this but i did not see this in pt chart. [name] was going to have pt call our office back since he was their at the pharmacy. Will await pt call back

## 2011-10-10 NOTE — Telephone Encounter (Signed)
Rx for albuterol nebs and neb machine faxed to [facility]. Pt aware and states nothing further needed.

## 2011-10-10 NOTE — Telephone Encounter (Signed)
I spoke with pt and he stated that MR told him to make sure he is doing his neb tx but he stated he uses his wife albuterol nebulizer and machine. He does not have any. I do not see where this was mentioned in the office note nor is this on the med list. Pt states he needs this. Pt wants rx sent to [facility] Please advise Dr. Marchelle Gearing, thanks

## 2011-10-10 NOTE — Telephone Encounter (Signed)
MR, please advise if it is the albuterol you want [place] to send rx for, or a different neb med and please advise directions for use, thanks

## 2011-10-10 NOTE — Telephone Encounter (Signed)
Pt returning in ref to his rx can be reached at [phone].Raylene Everts

## 2011-10-17 ENCOUNTER — Telehealth: Payer: Self-pay | Admitting: Internal Medicine

## 2011-10-17 ENCOUNTER — Ambulatory Visit (INDEPENDENT_AMBULATORY_CARE_PROVIDER_SITE_OTHER)
Admission: RE | Admit: 2011-10-17 | Discharge: 2011-10-17 | Disposition: A | Discharge status: Home / Self Care | Payer: Medicare Other | Source: Ambulatory Visit | Attending: Internal Medicine | Admitting: Internal Medicine

## 2011-10-17 ENCOUNTER — Ambulatory Visit (INDEPENDENT_AMBULATORY_CARE_PROVIDER_SITE_OTHER): Payer: Medicare Other | Admitting: Internal Medicine

## 2011-10-17 ENCOUNTER — Encounter: Payer: Self-pay | Admitting: Internal Medicine

## 2011-10-17 VITALS — BP 140/78 | HR 97 | Ht 75.0 in | Wt 249.6 lb

## 2011-10-17 DIAGNOSIS — G4733 Obstructive sleep apnea (adult) (pediatric): Secondary | ICD-10-CM

## 2011-10-17 DIAGNOSIS — J449 Chronic obstructive pulmonary disease, unspecified: Secondary | ICD-10-CM

## 2011-10-17 DIAGNOSIS — Z9989 Dependence on other enabling machines and devices: Secondary | ICD-10-CM

## 2011-10-17 DIAGNOSIS — J441 Chronic obstructive pulmonary disease with (acute) exacerbation: Secondary | ICD-10-CM

## 2011-10-17 MED ORDER — ARFORMOTEROL TARTRATE 15 MCG/2ML IN NEBU
15.0000 ug | INHALATION_SOLUTION | Freq: Once | RESPIRATORY_TRACT | Status: AC
  Administered 2011-10-17: 15 ug via RESPIRATORY_TRACT

## 2011-10-17 MED ORDER — AZITHROMYCIN 250 MG PO TABS: ORAL_TABLET | ORAL | Status: AC

## 2011-10-17 NOTE — Patient Instructions (Addendum)
[name]     Script for Zpak sent to drug store  Start prednisone for your eye doctor as planned

## 2011-10-17 NOTE — Telephone Encounter (Signed)
Spoke with the patient and he was seen on 10-08-11 and was prescribed doxy and pred taper. He is finished with doxy and is now taking 5 mg daily of prednisone. He has 2 more days left of prednisone. Pt states he does not feel any improvement. He states he still has productive cough with yellow phlegm, increased SOB, chest congestion, body aches. Please advise. Carron Curie, CMA No Known Allergies

## 2011-10-17 NOTE — Progress Notes (Signed)
Subjective:    Patient ID: MELACHI TEES, male    DOB: 1941-05-28, 71 y.o.   MRN: [id number]  HPI 89 yowm quit smoking around 2000 with GOLD III COPD by PFT's 09/2009. Meds through [facility]  June 18, 2010: EX 100 pack year smoker. Home O2 dependent Gold stage 3 COPD with low DLCO 35%  in PFTs MArch 2011. BAeline pulse ox is 87-93% on 2L [place]. In,  OCt 2011 Ct showing some UIP component but predmoninant COPD on CT. 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 Sherene Sires to myself.  In Oct 2011 had some mild AECOPD related hemoptysis with CT showing only UIP but not showing any mass. Since then hemoptysis resolved. Currently feels baseline on 2-3L [place]. Claudication lmits him more than dyspnea at 1/4 th mile. Denies chronic cough but on mucinex and feels it helps. On spiriva, foradil and asmanex through [facility]. Has not done rehab though wife is attending rehab. Denies chest pains, edema, wheezing, fever, colored sputum. REC: CHECK AUTOIMMUNE PROFILE,  START NAC, GET FULL PFTs   August 12, 2010: Followup. in interim was admitted for sBP12/19/2011 due to [facility] from prior laparotomies. Resolved with conservative Rx. Started pulmnary rehab 2 weeks ago. Attend 4 sesssions. It is helping so far and he likes it. No new complaints. Dyspnea is stable class 2-3. Denies associated cough, chest pain, hemoptysis, edema, sputum, weight loss. Had autoimmune profile 07/03/2010 - CRP 10, RF 352 but otherwise negative. Upon revealing labs he states he is known to have RA diagnosed over 10 years ago and was on enbrel. He is due to see a rheumatologist at [place], [place]. PFTs today continue to show Gold stage 3 COPD and somewhat improved from a year ago.  August 27, 2010 --Presents for an acute office visit.Complains of increased sob,  dry cough, wheezing. Pt states he doesn't feel like he is getting enough oxygen -cant get enough air. Retaining more fluid.  Denies chest pain, orthopnea, hemoptysis, fever, n/v/d,  headache,recent travel. RX: DOXY AND PRED   OV 02/21/2011: Admitted 7/22 through 7/26 for Acute-on-chronic hypoxemia, probably with chronic obstructive       pulmonary disease exacerbation, pneumonia, acute congestive heart      Failure, (presented with dyspnea, edema, cough). est x-ray on July 22 yield chronic lung changes without acute      overlying pulmonary process.  A 2-D echo yields an ejection fraction of 55% to 60% with grade 2      diastolic dysfunction.  Mitral valve with moderately calcified      Annulus. ( I personally reviewed thos results)  Moderately thickened leaflets. RX: DIURESIS, PRED, AVELOX and O2   Today  Followup from above hospitalization. States he no longer has worsening dyspnea, edema, sputum, cough, fever, chills. Back to baseline o2 need of 3L [place]. However, feels fatigued today more than yesterday. Feels is due to benadryl which he is taking for insomnia. Was talking Remus Loffler but he was falling as a result and stopped. He has class 3 dyspnea at baseline. Has completed rehab.  Compliant with meds>>no changes  03/20/11  Acute OV  Complains of persistent leg swelling. Increased DOE with actiivity . Wears out easily. Lasix has been increased to 40mg  daily with limitedresponse. Weight trending up , now up 10 lbs over last month. Does have cards consult next week. Previous echo last month during hospital admit showed LV nml w/ grade 2 diastolic dysfnx. No chest pain ,  increaesed cough or congestion  >>Aldactone 25mg  Twice daily  rx   04/29/2011 Follow up  Pt returns for follow up. Last ov with increased leg swelling despite lasix 40mg . Aldactone 25mg  Twice daily was added to his regimen. He says this did help some however was taken off per cardiology recommendations. He says leg swelling is about the same.  Has gotten some TEDS stocking to wear daily .   Does complain of increased congestion for last few days with green mucus.  Wife was recently tx for bronchitis. NO fever or hemoptysis. No recent abx use. Taking spiriva , foradil and asmanex without known difficulty-gets meds via [place] system.  >>STOP ALDACTONE per DR Nelda Severe  05/09/2011 Post hospital  Pt returns for close follow up from hospitalization. Admitted 10/7-10/11 for acute on chronic resp failure w/ ILD flare, CHF w/ vol overload, and possible CAP along w/ supsected OSA /hypercarbia.  He was tx w/ initial BIPAP w/ transition to nocturnal BIPAP for suspected OSA w/  Associated hypercarbia. Along w/ pulmonary hygiene, IV abx and steroids. HE was aggressively  Diuresised w/ improvement in vol overload. Today bmet  shows improved renal insufficiency .  XRAy showed increased consolidation predominately in bases.  He returns today improved w/ weight loss of >10lbs w/ substantial decreased in edema.  Feels his breathing is back to his baseline.  No wheezing or hemoptysis. No chest pain or fever.  >>pred taper , lasix 40mg  , sleep study and referral to [facility]  OV 05/22/11: Followup for above. Recent AECOPD. Suspected OSA; sent hom on bipap/cpap which he says is helping sleep quality and wakefulness immensely. Thinks sleep study is abnromal. He is due to see Dr Vassie Loll soon for it. Denies resp discoomfort. Still bothered by cosmetic effects of edema. He is frustrated that despite lasix and fluid restriction it still persists (has varicose veins). Advised him to see what happens with OSA Rx. He also wants to know how to prevent AECOPD >>  06/11/11 Acute OV  Complains of increased SOB, prod cough with clear mucus, body aches, weight increase of 4# x2days. Has increaesed edema and weight is up 4 lbs. He is more dyspneic with activity. We reviewed his meds . He did eat quite a bit of salty food yesterday. We discussed low salt diet.  Wants to catch his breathing before it gets too bad. No discolored mucus or fever.   REC Increase Lasix 40mg  1 extra tablet for next 2 days  Low  salt diet  Legs elevated.  Follow up Dr. Vassie Loll And Marchelle Gearing As planned  Please contact office for sooner follow up if symptoms do not improve or worsen or seek emergency care   OV 08/27/2011 Called in for AECOPD RX 07/11/11. Saw Dr Vassie Loll for sleep 08/06/11 and felt he was doing well on CPAP 16. Today reports 3 days increased dyspnea; was walking around the block with his 3L o2 but now able to hardly do mailbox and back. Slight associated increase in wheeze and sputum volume but no change in sputum color or cough. Feels like he is in AECOPD Denies fever, chest pain, or edema worsening. Spirometry today: Fev1 1.3L/30% predicted, ratio 48. He is interested in lung transplant. Says a friend who is diabetic got transplanted for COPD at [facility] and is doing well. Says he recollects me telling him that he is a poor lung transplant candidate (I do not remember that) and that his hope is being taken away  Past, Family, Social reviewed: no change  since last visit except as in HPI   REC You have mild attack of copd called COPD exacerbation  Please take levaquin500 mg once daily x 6days; avoid sunlight  Please take prednisone 40mg  once daily x 3 days, then 20mg  once daily x 3 days, then 10mg  once daily x 3 days, then 5mg  once dailyx 3 days and stop  Check alpha 1 today  REturn in 1 month with full PFT  I will touch base with [facility] Transplant team for a pre-evaluation  OV 09/23/2011 PFTS 09/23/2011 shows gold stage 3 copd. fev1 1.55L/47%, Ratio 45, TLC 7L/95%, DLCO 10/38%, 8% BD response in fev1. This is improved 250cc in fev1 compared to last visit when he was in AECOPD. Currently well and baseline. After last OV I spoke t [facility] pre-transplant evaluation MD who has requested his charts. I believe my office should have sent them these. Patient has not heard from them.  Past, Family, Social reviewed: no change since last visit  REC You have severe copd  THe lung function is improved from last visit and baseline  today at 47-50% of capacity  Continue oxygen and medications  Nurse will ensure that referral paper work was received by [facility] transplant team for their review  Return in 6 months or sooner if needed  OV 10/08/2011  Past 2 days is havig increased chest congestion, runny nose, runny eyes, cold, wheeze, cough and yellow sputum and dyspnea all c/w prior AECOPD. Rates it as moderate severity but not severe enough he needs admission. Denies edema, chest pain, syncope  Past, Family, Social reviewed: He had rt eye surgery by Dr Dione Booze and is currently on prednisone 30mg  (day 4) through 3/18 and then off, and restarts on 3/25 for surgery April on left eye on April 1st. I d/w Dr Dione Booze who says steroids based on Dr [facility] for prevention of scleritis. Dr Dione Booze ok with pred taper through 10/22/11 for AECOPD CHEST - 2 VIEW  Comparison: Plain films of the chest 05/07/2010 and 05/06/2011. CT  chest 05/08/2010.  Findings: Pleural and parenchymal scarring in the lower right chest  is unchanged. Lungs otherwise clear. Heart size is normal. No  pneumothorax or effusion.  IMPRESSION:  No acute finding. Stable compared prior exams.  Original Report Authenticated By: Bernadene Bell. D'ALESSIO, M.D.    10/17/11- 70 yowm quit smoking around 2000 with GOLD III COPD by PFT's 09/2009. Meds through [facility] Acute VISIT. - Dr Maple Hudson-  MR patient-seen 1 wk ago-SOB, cough-given pred and abx(completed) and still having issues; heaviness in mid-chest. Cough thick white sputum, increased wheeze, no fever, edema, palpitation, pain or blood. Last week had nebulizer treatment, Depo-Medrol, doxycycline and prednisone taper. No better now. Using his nebulizer 4 times daily. He feels he was doing very well until cataract surgery 3 weeks ago. He is now due to have his other eye done on April 1. His ophthalmologist, treating scleritis, is using prednisolone eyedrops and prednisone 30 mg daily for 2 weeks to start on March  25. Prednisone does not seem to have affected respiratory complaints. Took Lasix today. Sleeping with CPAP 16 plus oxygen all night every night. CXR today/ 10/17/11- reviewed w/ him: Findings: Pleural and parenchymal scarring in the lower right chest  is unchanged. Lungs otherwise clear. Heart size is normal. No  pneumothorax or effusion.  IMPRESSION:  No acute finding. Stable compared prior exams.  Original Report Authenticated By: Bernadene Bell. D'ALESSIO, M.D.       ROS-see HPI  Constitutional:   No-   weight loss, night sweats, fevers, chills, fatigue, lassitude. HEENT:   No-  headaches, difficulty swallowing, tooth/dental problems, sore throat,       No-  sneezing, itching, ear ache, nasal congestion, post nasal drip,  CV:  No-   chest pain, orthopnea, PND, swelling in lower extremities, anasarca,  dizziness, palpitations Resp: +  shortness of breath with exertion or at rest.              +  productive cough,  No non-productive cough,  No- coughing up of blood.             + change in color of mucus.  No- wheezing.   Skin: No-   rash or lesions. GI:  No-   heartburn, indigestion, abdominal pain, nausea, vomiting,  GU:  MS:  No-   joint pain or swelling.  No- decreased range of motion.  No- back pain. Neuro-     nothing unusual Psych:  No- change in mood or affect. No depression or anxiety.  No memory loss.  OBJ- Physical Exam General- Alert, Oriented, Affect-appropriate, Distress- none acute, O2 2-3 L sat 97& Skin- rash-none, lesions- none, excoriation- none Lymphadenopathy- none Head- atraumatic            Eyes- Gross vision intact, PERRLA, conjunctivae and secretions clear            Ears- Hearing, canals-normal            Nose- Clear, no-Septal dev, mucus, polyps, erosion, perforation             Throat- Mallampati II , mucosa clear , drainage- none, tonsils- atrophic Neck- flexible , trachea midline, no stridor , thyroid nl, carotid no bruit Chest - symmetrical excursion ,  unlabored           Heart/CV- RRR , no murmur , no gallop  , no rub, nl s1 s2                           - JVD- none , edema- none, stasis changes- none, varices- none           Lung- +Mildly breathless speech. , wheeze-+ R upper, +deep cough , dullness-none, rub- none           Chest wall-  Abd-  Br/ Gen/ Rectal- Not done, not indicated Extrem- cyanosis- none, clubbing, none, atrophy- none, strength- nl Neuro- grossly intact to observation               Chest a day I

## 2011-10-17 NOTE — Telephone Encounter (Signed)
I spoke with the pt and advised OV today at 2:45pm with CXR before. Carron Curie, CMA

## 2011-10-17 NOTE — Telephone Encounter (Signed)
He has never failed Rx. Wonder if he might have pna and needs admit ? Please see if he can be seen acutely today with cxr.

## 2011-10-20 NOTE — Assessment & Plan Note (Signed)
In good compliance and control

## 2011-10-20 NOTE — Assessment & Plan Note (Signed)
Continue to treat as nonspecific exacerbation of COPD. Watch for alternative explanations. I don't understand relation of symptoms to cataract surgery as he states. Plan - Brovana nebulizer Z-Pak

## 2011-10-29 ENCOUNTER — Ambulatory Visit: Payer: Medicare Other | Admitting: Adult Health

## 2011-11-04 DIAGNOSIS — H15099 Other scleritis, unspecified eye: Secondary | ICD-10-CM | POA: Insufficient documentation

## 2011-12-05 ENCOUNTER — Encounter: Payer: Self-pay | Admitting: Endocrinology

## 2011-12-05 ENCOUNTER — Ambulatory Visit (INDEPENDENT_AMBULATORY_CARE_PROVIDER_SITE_OTHER): Payer: Medicare Other | Admitting: Endocrinology

## 2011-12-05 ENCOUNTER — Other Ambulatory Visit: Payer: Medicare Other

## 2011-12-05 VITALS — BP 132/72 | HR 81 | Temp 98.8°F | Ht 75.0 in | Wt 251.0 lb

## 2011-12-05 DIAGNOSIS — N390 Urinary tract infection, site not specified: Secondary | ICD-10-CM

## 2011-12-05 LAB — POCT URINALYSIS DIPSTICK
Ketones, UA: NEGATIVE
Protein, UA: NEGATIVE
Urobilinogen, UA: 4
pH, UA: 6.5

## 2011-12-05 MED ORDER — CIPROFLOXACIN HCL 500 MG PO TABS: 500.0000 mg | ORAL_TABLET | Freq: Two times a day (BID) | ORAL | Status: AC

## 2011-12-05 NOTE — Patient Instructions (Signed)
i have sent a prescription to your pharmacy, for an antibiotic Please see dr Jonny Ruiz next week as scheduled.

## 2011-12-05 NOTE — Progress Notes (Signed)
Subjective:    Patient ID: [name], male    DOB: September 02, 1940, 71 y.o.   MRN: [id number]  HPI Pt states 2 days of slight pain at the perineal area, in the context of urination.  No assoc fever.  He says he has never had these sxs before, but he does see urol for bph.   Past Medical History  Diagnosis Date  . THRUSH 11/06/2009  . HYPOTHYROIDISM 07/30/2009  . DIABETES MELLITUS, TYPE II 11/23/2009  . DEPRESSION 11/23/2009  . 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 HYPERTENSION 07/30/2009  . CORONARY ARTERY DISEASE 11/23/2009  . CHRONIC OBSTRUCTIVE PULMONARY DISEASE, ACUTE EXACERBATION 03/01/2010  . C O P D 07/30/2009  . 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 RESPIRATORY FAILURE, CHRONIC 07/31/2009  . BENIGN PROSTATIC HYPERTROPHY 11/23/2009  . DEGENERATIVE JOINT DISEASE 11/23/2009  . 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 FATIGUE 11/23/2009  . 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 GAIT DISTURBANCE 12/10/2009  . DYSPNEA/SHORTNESS OF BREATH 12/08/2009  . HEMOPTYSIS UNSPECIFIED 05/07/2010  . RA (rheumatoid arthritis) 06/11/2011    Past Surgical History  Procedure Date  . Small bowel obstruction repair with adhesiolysis   . Hx of remote ileum resection due to bleeding   . Abdominal aortic aneurysm repair   . S/p back surgury  1972    lumbar laminectomy Dr. Fannie Knee  . S/p left femoral embolectomy with left leg ischemia     Dr. Hart [place], vascular  . S/p right ganglion cyst     Dr. Teressa Senter    History   Social History  . Marital Status: Married    Spouse Name: N/A    Number of Children: N/A  . Years of Education: N/A   Occupational History  . disabled veteran, Ex Human resources officer    Social History Main Topics  . Smoking status: Former Smoker -- 2.5 packs/day for 40 years    Types: Cigarettes, Pipe, Cigars    Quit date: 07/28/1998  . Smokeless tobacco: Never Used   Comment: quit in 2000 x 40 yrs. 2.5 ppd  . Alcohol Use: No  . Drug Use: No  . Sexually Active: Not on file   Other Topics Concern  . Not on file    Social History Narrative   Lives in the home with [facility]Se[facility].Denies asbestos exposure    Current Outpatient Prescriptions on File Prior to Visit  Medication Sig Dispense Refill  . albuterol (PROAIR HFA) 108 (90 BASE) MCG/ACT inhaler Inhale 2 puffs into the lungs every 4 (four) hours as needed. Shortness of breath  1 Inhaler  6  . albuterol (PROVENTIL) (2.5 MG/3ML) 0.083% nebulizer solution 1 vial in nebulizer every 6 hours as needed Dx 496  120 mL  1  . B-D INS SYR ULTRAFINE 1CC/30G 30G X 1/2" 1 ML MISC       . Cholecalciferol (VITAMIN D3) 2000 UNITS TABS Take 1 tablet by mouth daily.       [name] DEPO-TESTOSTERONE 200 MG/ML injection Inject 200 mg into the muscle every 28 (twenty-eight) days.       [name] doxepin (SINEQUAN) 50 MG capsule Take 50 mg by mouth at bedtime.        . finasteride (PROSCAR) 5 MG tablet Take 5 mg by mouth daily.        . formoterol (FORADIL AEROLIZER) 12 MCG capsule for inhaler Inhale contents of 1 capsule two times a day       . furosemide (LASIX) 40 MG tablet Take  1 tablet (40 mg total) by mouth daily.  90 tablet  3  . gabapentin (NEURONTIN) 800 MG tablet Take 800 mg by mouth 2 (two) times daily.        . insulin aspart (NOVOLOG) 100 UNIT/ML injection 10-20 Units 3 (three) times daily before meals. Sliding scale as needed with meals (14 units in the morning 12 units at lunch and 24 units at dinner)      . insulin glargine (LANTUS) 100 UNIT/ML injection Inject 80 Units into the skin at bedtime. Adjust as needed      . levothyroxine (LEVOTHROID) 25 MCG tablet Take 1 tablet (25 mcg total) by mouth daily.  90 tablet  3  . losartan (COZAAR) 100 MG tablet 1/2 by mouth once daily       . metFORMIN (GLUCOPHAGE) 1000 MG tablet Take 1,000 mg by mouth 2 (two) times daily.        . mometasone (ASMANEX 60 METERED DOSES) 220 MCG/INH inhaler Inhale 2 puffs into the lungs daily.        . Multiple Vitamins-Minerals (MEGA MULTIVITAMIN FOR MEN PO) Take by mouth  daily.        . naphazoline (CLEAR EYES) 0.012 % ophthalmic solution Place 1 drop into both eyes as needed. Red eyes       . Nebulizers (COMPRESSOR/NEBULIZER) MISC Use as directed Dx 496  1 each  0  . PLAQUENIL 200 MG tablet Take 1 tablet by mouth Daily.      . predniSONE (DELTASONE) 10 MG tablet Take 4 tabs daily x 3 days, then 2 tabs daily x 3, then 1 tab daily x 3, then 1/2 tab daily x3 days, then stop.  23 tablet  0  . simvastatin (ZOCOR) 20 MG tablet Take 5 mg by mouth at bedtime.       . Tamsulosin HCl (FLOMAX) 0.4 MG CAPS Take by mouth at bedtime.        [name] tiotropium (SPIRIVA HANDIHALER) 18 MCG inhalation capsule Place 18 mcg into inhaler and inhale daily.        [name] VIAGRA 100 MG tablet prn        No Known Allergies  No family history on file.  BP 132/72  Pulse 81  Temp(Src) 98.8 F (37.1 C) (Oral)  Ht 6\' 3"  (1.905 m)  Wt 251 lb (113.853 kg)  BMI 31.37 kg/m2  SpO2 92%    Review of Systems Denies urinary retention and hematuria.      Objective:   Physical Exam VITAL SIGNS:  See vs page GENERAL: no distress GENITALIA: Normal male testicles, scrotum, and penis   (i reviewed ua results)    Assessment & Plan:  UTI, new

## 2011-12-07 LAB — URINE CULTURE

## 2011-12-10 ENCOUNTER — Encounter: Payer: Self-pay | Admitting: Internal Medicine

## 2011-12-10 ENCOUNTER — Ambulatory Visit (INDEPENDENT_AMBULATORY_CARE_PROVIDER_SITE_OTHER): Payer: Medicare Other | Admitting: Internal Medicine

## 2011-12-10 VITALS — BP 102/52 | HR 67 | Temp 97.5°F | Ht 75.0 in | Wt 250.0 lb

## 2011-12-10 DIAGNOSIS — E119 Type 2 diabetes mellitus without complications: Secondary | ICD-10-CM

## 2011-12-10 DIAGNOSIS — J449 Chronic obstructive pulmonary disease, unspecified: Secondary | ICD-10-CM

## 2011-12-10 DIAGNOSIS — N390 Urinary tract infection, site not specified: Secondary | ICD-10-CM

## 2011-12-10 DIAGNOSIS — I10 Essential (primary) hypertension: Secondary | ICD-10-CM

## 2011-12-10 DIAGNOSIS — N529 Male erectile dysfunction, unspecified: Secondary | ICD-10-CM

## 2011-12-10 MED ORDER — VARDENAFIL HCL 20 MG PO TABS: 20.0000 mg | ORAL_TABLET | ORAL | Status: DC | PRN

## 2011-12-10 MED ORDER — LEVOFLOXACIN 250 MG PO TABS: 250.0000 mg | ORAL_TABLET | Freq: Every day | ORAL | Status: AC

## 2011-12-10 MED ORDER — TRAMADOL HCL 50 MG PO TABS
50.0000 mg | ORAL_TABLET | Freq: Four times a day (QID) | ORAL | Status: AC | PRN
Start: 2011-12-10 — End: 2011-12-20

## 2011-12-10 NOTE — Progress Notes (Signed)
Subjective:    Patient ID: [name], male    DOB: [birth year], 71 y.o.   MRN: [id number]  HPI  Here to f/u;  Not much improvement in UTI symptoms, though pain medication still helps.  Has seen urology but elected med tx instead of surgical about 6 mo ago, this is first infection/uti for him.  Also has ongoing ED and was afraid prostate surgury would make this worse as well as possible worsening urinary incontinence;  viagra not helping much at all;  Has not tried levitra/cialis.  Has seen urology at least twice, and not aware of need to go back/has no future appointments.  No worsening fever, n/v, flank pain or back pain though has a funny pain about the rectal area with painful urination as well.    Pt denies chest pain, increased sob or doe, wheezing, orthopnea, PND, increased LE swelling, palpitations, dizziness or syncope.   Pt denies polydipsia, polyuria, or low sugar symptoms such as weakness or confusion improved with po intake.  Pt states overall good compliance with meds, trying to follow lower cholesterol, diabetic diet, wt overall stable but little exercise however.     Past Medical History  Diagnosis Date  . THRUSH 11/06/2009  . HYPOTHYROIDISM 07/30/2009  . DIABETES MELLITUS, TYPE II 11/23/2009  . DEPRESSION 11/23/2009  . 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 HYPERTENSION 07/30/2009  . CORONARY ARTERY DISEASE 11/23/2009  . CHRONIC OBSTRUCTIVE PULMONARY DISEASE, ACUTE EXACERBATION 03/01/2010  . C O P D 07/30/2009  . 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 RESPIRATORY FAILURE, CHRONIC 07/31/2009  . BENIGN PROSTATIC HYPERTROPHY 11/23/2009  . DEGENERATIVE JOINT DISEASE 11/23/2009  . 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 FATIGUE 11/23/2009  . 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 GAIT DISTURBANCE 12/10/2009  . DYSPNEA/SHORTNESS OF BREATH 12/08/2009  . HEMOPTYSIS UNSPECIFIED 05/07/2010  . RA (rheumatoid arthritis) 06/11/2011   Past Surgical History  Procedure Date  . Small bowel obstruction repair with  adhesiolysis   . Hx of remote ileum resection due to bleeding   . Abdominal aortic aneurysm repair   . S/p back surgury  1972    lumbar laminectomy Dr. Fannie Knee  . S/p left femoral embolectomy with left leg ischemia     Dr. Hart Rochester, vascular  . S/p right ganglion cyst     Dr. Teressa Senter    reports that he quit smoking about 13 years ago. His smoking use included Cigarettes, Pipe, and Cigars. He has a 100 pack-year smoking history. He has never used smokeless tobacco. He reports that he does not drink alcohol or use illicit drugs. family history is not on file. No Known Allergies Current Outpatient Prescriptions on File Prior to Visit  Medication Sig Dispense Refill  . albuterol (PROAIR HFA) 108 (90 BASE) MCG/ACT inhaler Inhale 2 puffs into the lungs every 4 (four) hours as needed. Shortness of breath  1 Inhaler  6  . albuterol (PROVENTIL) (2.5 MG/3ML) 0.083% nebulizer solution 1 vial in nebulizer every 6 hours as needed Dx 496  120 mL  1  . B-D INS SYR ULTRAFINE 1CC/30G 30G X 1/2" 1 ML MISC       . Cholecalciferol (VITAMIN D3) 2000 UNITS TABS Take 1 tablet by mouth daily.       [name] DEPO-TESTOSTERONE 200 MG/ML injection Inject 200 mg into the muscle every 28 (twenty-eight) days.       [name] doxepin (SINEQUAN) 50 MG capsule Take 50 mg by mouth at bedtime.        . finasteride (  PROSCAR) 5 MG tablet Take 5 mg by mouth daily.        . formoterol (FORADIL AEROLIZER) 12 MCG capsule for inhaler Inhale contents of 1 capsule two times a day       . furosemide (LASIX) 40 MG tablet Take 1 tablet (40 mg total) by mouth daily.  90 tablet  3  . gabapentin (NEURONTIN) 800 MG tablet Take 800 mg by mouth 2 (two) times daily.        . insulin aspart (NOVOLOG) 100 UNIT/ML injection 10-20 Units 3 (three) times daily before meals. Sliding scale as needed with meals (14 units in the morning 12 units at lunch and 24 units at dinner)      . insulin glargine (LANTUS) 100 UNIT/ML injection Inject 80 Units into the skin at bedtime.  Adjust as needed      . levothyroxine (LEVOTHROID) 25 MCG tablet Take 1 tablet (25 mcg total) by mouth daily.  90 tablet  3  . losartan (COZAAR) 100 MG tablet 1/2 by mouth once daily       . metFORMIN (GLUCOPHAGE) 1000 MG tablet Take 1,000 mg by mouth 2 (two) times daily.        . mometasone (ASMANEX 60 METERED DOSES) 220 MCG/INH inhaler Inhale 2 puffs into the lungs daily.        . Multiple Vitamins-Minerals (MEGA MULTIVITAMIN FOR MEN PO) Take by mouth daily.        . naphazoline (CLEAR EYES) 0.012 % ophthalmic solution Place 1 drop into both eyes as needed. Red eyes       . Nebulizers (COMPRESSOR/NEBULIZER) MISC Use as directed Dx 496  1 each  0  . PLAQUENIL 200 MG tablet Take 1 tablet by mouth Daily.      . predniSONE (DELTASONE) 10 MG tablet Take 4 tabs daily x 3 days, then 2 tabs daily x 3, then 1 tab daily x 3, then 1/2 tab daily x3 days, then stop.  23 tablet  0  . simvastatin (ZOCOR) 20 MG tablet Take 5 mg by mouth at bedtime.       . Tamsulosin HCl (FLOMAX) 0.4 MG CAPS Take by mouth at bedtime.        [name] tiotropium (SPIRIVA HANDIHALER) 18 MCG inhalation capsule Place 18 mcg into inhaler and inhale daily.        [name] VIAGRA 100 MG tablet prn      . ciprofloxacin (CIPRO) 500 MG tablet Take 1 tablet (500 mg total) by mouth 2 (two) times daily.  14 tablet  0  . vardenafil (LEVITRA) 20 MG tablet Take 1 tablet (20 mg total) by mouth as needed for erectile dysfunction.  5 tablet  11   Review of Systems Review of Systems  Constitutional: Negative for diaphoresis and unexpected weight change. [name]  Respiratory: Negative for choking and stridor.   Gastrointestinal: Negative for vomiting and blood in stool.  Genitourinary: Negative for hematuria and decreased urine volume.  Musculoskeletal: Negative for gait problem.  Skin: Negative for color change and wound.  Neurological: Negative for tremors and numbness.  Psychiatric/Behavioral: Negative for decreased concentration. The patient is not  hyperactive.       Objective:   Physical Exam BP 102/52  Pulse 67  Temp(Src) 97.5 F (36.4 C) (Oral)  Ht 6\' 3"  (1.905 m)  Wt 250 lb (113.399 kg)  BMI 31.25 kg/m2  SpO2 92% Physical Exam  VS noted Constitutional: Pt appears well-developed and well-nourished.  HENT: Head: Normocephalic.  Right  Ear: External ear normal.  Left Ear: External ear normal.  Eyes: Conjunctivae and EOM are normal. Pupils are equal, round, and reactive to light.  Neck: Normal range of motion. Neck supple.  Cardiovascular: Normal rate and regular rhythm.   Pulmonary/Chest: Effort normal and breath sounds normal.  Abd:  Soft, NT, non-distended, + BS except for mild low mid abd tender without guarding or rebound; no flank tender Neurological: Pt is alert. No cranial nerve deficit.  Skin: Skin is warm. No erythema.  Psychiatric: Pt behavior is normal. Thought content normal. 1+ nervous    Assessment & Plan:

## 2011-12-10 NOTE — Patient Instructions (Addendum)
Ok to stop the [employer] the levaquin (sent to [facility]) Tramadol for pain was also sent  You are given the hardcopy for Levitra since this is less expensive at [facility] Continue all other medications as before Please have the pharmacy call with any refills you may need. Please return in 3 months or sooner if needed

## 2011-12-10 NOTE — Assessment & Plan Note (Signed)
Still with signficant pain, and cipro suboptimal for tx given the enteroccoccus, for change to levoquin,tramadol for pain per pt request,  to f/u any worsening symptoms or concerns, will need urology if leakage or worsening uti's recur

## 2011-12-10 NOTE — Assessment & Plan Note (Signed)
viagra not working- to try levitra prn

## 2011-12-13 ENCOUNTER — Encounter: Payer: Self-pay | Admitting: Internal Medicine

## 2011-12-13 NOTE — Assessment & Plan Note (Signed)
stable overall by hx and exam, most recent data reviewed with pt, and pt to continue medical treatment as before BP Readings from Last 3 Encounters:  12/10/11 102/52  12/05/11 132/72  10/17/11 140/78

## 2011-12-13 NOTE — Assessment & Plan Note (Signed)
stable overall by hx and exam, most recent data reviewed with pt, and pt to continue medical treatment as before Lab Results  Component Value Date   HGBA1C 8.5* 02/17/2011

## 2011-12-13 NOTE — Assessment & Plan Note (Signed)
stable overall by hx and exam, most recent data reviewed with pt, and pt to continue medical treatment as before SpO2 Readings from Last 3 Encounters:  12/10/11 92%  12/05/11 92%  10/17/11 97%

## 2012-01-26 ENCOUNTER — Ambulatory Visit (INDEPENDENT_AMBULATORY_CARE_PROVIDER_SITE_OTHER): Payer: Medicare Other | Admitting: Internal Medicine

## 2012-01-26 ENCOUNTER — Other Ambulatory Visit (INDEPENDENT_AMBULATORY_CARE_PROVIDER_SITE_OTHER): Payer: Medicare Other

## 2012-01-26 ENCOUNTER — Other Ambulatory Visit: Payer: Self-pay | Admitting: Internal Medicine

## 2012-01-26 ENCOUNTER — Encounter: Payer: Self-pay | Admitting: Internal Medicine

## 2012-01-26 VITALS — BP 124/62 | HR 85 | Temp 98.2°F | Ht 75.0 in | Wt 250.0 lb

## 2012-01-26 DIAGNOSIS — I10 Essential (primary) hypertension: Secondary | ICD-10-CM

## 2012-01-26 DIAGNOSIS — E119 Type 2 diabetes mellitus without complications: Secondary | ICD-10-CM

## 2012-01-26 DIAGNOSIS — R55 Syncope and collapse: Secondary | ICD-10-CM

## 2012-01-26 LAB — HEPATIC FUNCTION PANEL
AST: 25 U/L (ref 0–37)
Albumin: 4.2 g/dL (ref 3.5–5.2)
Alkaline Phosphatase: 56 U/L (ref 39–117)
Total Protein: 7.1 g/dL (ref 6.0–8.3)

## 2012-01-26 LAB — HEMOGLOBIN A1C: Hgb A1c MFr Bld: 8 % — ABNORMAL HIGH (ref 4.6–6.5)

## 2012-01-26 LAB — URINALYSIS, ROUTINE W REFLEX MICROSCOPIC
Nitrite: NEGATIVE
Specific Gravity, Urine: 1.02 (ref 1.000–1.030)
pH: 6 (ref 5.0–8.0)

## 2012-01-26 LAB — BASIC METABOLIC PANEL
BUN: 18 mg/dL (ref 6–23)
CO2: 30 mEq/L (ref 19–32)
Calcium: 9.6 mg/dL (ref 8.4–10.5)
Glucose, Bld: 168 mg/dL — ABNORMAL HIGH (ref 70–99)
Potassium: 5 mEq/L (ref 3.5–5.1)
Sodium: 138 mEq/L (ref 135–145)

## 2012-01-26 LAB — CBC WITH DIFFERENTIAL/PLATELET
Basophils Absolute: 0.1 10*3/uL (ref 0.0–0.1)
Eosinophils Relative: 3.3 % (ref 0.0–5.0)
Lymphocytes Relative: 30.8 % (ref 12.0–46.0)
Monocytes Relative: 9 % (ref 3.0–12.0)
Platelets: 163 10*3/uL (ref 150.0–400.0)
RDW: 15.1 % — ABNORMAL HIGH (ref 11.5–14.6)
WBC: 6.6 10*3/uL (ref 4.5–10.5)

## 2012-01-26 MED ORDER — CEPHALEXIN 500 MG PO CAPS: 500.0000 mg | ORAL_CAPSULE | Freq: Four times a day (QID) | ORAL | Status: AC

## 2012-01-26 NOTE — Patient Instructions (Addendum)
Your EKG was ok today Please allow yourself to drink more fluids than you do now - up to 3/4 gallon fluids per day if your current is 1/2 gallon per day If your wt today at home this AM was 247, I think your "dry weight" (where you are hydrated but fluid overloaded) should likely about 252, and you should only think about taking a fluid pill if your weight gets up to 255 You will be contacted regarding the referral for: Home Health - for IVFs over the next 3 days Please go to LAB in the Basement for the blood and/or urine tests to be done today You will be contacted by phone if any changes need to be made immediately.  Otherwise, you will receive a letter about your results with an explanation. Please return in 1 week

## 2012-01-26 NOTE — Assessment & Plan Note (Addendum)
ECG reviewed as per emr, frank syncope occurred 5 wks ago, with persistent nausea and dizziness since then, with orthostasis noted on exam today with pre-syncope today;  Careful d/w pt regarding "dry wt" concept; wt today 247 at home, should hold the diuretic and allow wt to increase to 252, and take diuretic only if wt gets to 255 or edema occurs;  It is noted today pt receives care at [facility] and pulm [place] as well; for 3 days NS IV , consider card or endo eval but he is adamant he will f/u at [facility]

## 2012-01-28 ENCOUNTER — Telehealth: Payer: Self-pay | Admitting: Internal Medicine

## 2012-01-28 ENCOUNTER — Telehealth: Payer: Self-pay

## 2012-01-28 NOTE — Telephone Encounter (Signed)
noted 

## 2012-01-28 NOTE — Telephone Encounter (Signed)
[facility] [phone] Diane) called to inform the MD that the patient denied getting IV fluids.  Stating he has been drinking and does not plan on staying home for 8 hours hooked up to an IV. [facility] needs faxed D/C to pharmacy [phone] signed by MD stating IV Fluids D/C.

## 2012-01-28 NOTE — Telephone Encounter (Signed)
[facility] needs a note faxed to the pharmacy D/C IV fluids

## 2012-01-28 NOTE — Telephone Encounter (Signed)
Faxed to [facility]  

## 2012-01-28 NOTE — Telephone Encounter (Signed)
LMTCB for [facility]- looks like Dr. Jonny Ruiz ordered this. Will await a call back.

## 2012-01-30 NOTE — Telephone Encounter (Signed)
[email or web address]  

## 2012-01-31 ENCOUNTER — Encounter: Payer: Self-pay | Admitting: Internal Medicine

## 2012-01-31 NOTE — Assessment & Plan Note (Signed)
stable overall by hx and exam, most recent data reviewed with pt, and pt to continue medical treatment as before BP Readings from Last 3 Encounters:  01/26/12 124/62  12/10/11 102/52  12/05/11 132/72

## 2012-01-31 NOTE — Progress Notes (Signed)
Subjective:    Patient ID: [name], male    DOB: [birth year], 71 y.o.   MRN: [id number]  HPI  Here to f/u after found by nurse at [facility] to be orthostatic after pt c/o near syncope this am while there to pick up med;  Did have episode frank syncope 5 wks ago, with ongoing nausea and dizziness since then.  States wt stable, and has been extremely diligent about avoiding po fluids after he was advised to not take po fluids such that his wt would increase over current wt and cause worsening CHF.  Diovan 25 mg stopped per [facility] physician.  Overall good compliance with treatment, and good medicine tolerability, including the diuretic.  Pt denies chest pain, increased sob or doe, wheezing, orthopnea, PND, increased LE swelling, palpitations.  Pt denies new neurological symptoms such as new headache, or facial or extremity weakness or numbness   Pt denies polydipsia, polyuria.   Pt denies fever, wt loss, night sweats, loss of appetite, or other constitutional symptoms Past Medical History  Diagnosis Date  . THRUSH 11/06/2009  . HYPOTHYROIDISM 07/30/2009  . DIABETES MELLITUS, TYPE II 11/23/2009  . DEPRESSION 11/23/2009  . 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 HYPERTENSION 07/30/2009  . CORONARY ARTERY DISEASE 11/23/2009  . CHRONIC OBSTRUCTIVE PULMONARY DISEASE, ACUTE EXACERBATION 03/01/2010  . C O P D 07/30/2009  . 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 RESPIRATORY FAILURE, CHRONIC 07/31/2009  . BENIGN PROSTATIC HYPERTROPHY 11/23/2009  . DEGENERATIVE JOINT DISEASE 11/23/2009  . 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 FATIGUE 11/23/2009  . 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 GAIT DISTURBANCE 12/10/2009  . DYSPNEA/SHORTNESS OF BREATH 12/08/2009  . HEMOPTYSIS UNSPECIFIED 05/07/2010  . RA (rheumatoid arthritis) 06/11/2011   Past Surgical History  Procedure Date  . Small bowel obstruction repair with adhesiolysis   . Hx of remote ileum resection due to bleeding   . Abdominal aortic aneurysm repair   . S/p back surgury  1972   lumbar laminectomy Dr. Fannie Knee  . S/p left femoral embolectomy with left leg ischemia     Dr. Hart Rochester, vascular  . S/p right ganglion cyst     Dr. Teressa Senter    reports that he quit smoking about 13 years ago. His smoking use included Cigarettes, Pipe, and Cigars. He has a 100 pack-year smoking history. He has never used smokeless tobacco. He reports that he does not drink alcohol or use illicit drugs. family history is not on file. No Known Allergies Current Outpatient Prescriptions on File Prior to Visit  Medication Sig Dispense Refill  . albuterol (PROAIR HFA) 108 (90 BASE) MCG/ACT inhaler Inhale 2 puffs into the lungs every 4 (four) hours as needed. Shortness of breath  1 Inhaler  6  . albuterol (PROVENTIL) (2.5 MG/3ML) 0.083% nebulizer solution 1 vial in nebulizer every 6 hours as needed Dx 496  120 mL  1  . B-D INS SYR ULTRAFINE 1CC/30G 30G X 1/2" 1 ML MISC       . Cholecalciferol (VITAMIN D3) 2000 UNITS TABS Take 1 tablet by mouth daily.       [name] DEPO-TESTOSTERONE 200 MG/ML injection Inject 200 mg into the muscle every 28 (twenty-eight) days.       [name] doxepin (SINEQUAN) 50 MG capsule Take 50 mg by mouth at bedtime.        . finasteride (PROSCAR) 5 MG tablet Take 5 mg by mouth daily.        . formoterol (FORADIL AEROLIZER) 12 MCG capsule for inhaler Inhale  contents of 1 capsule two times a day       . furosemide (LASIX) 40 MG tablet Take 1 tablet (40 mg total) by mouth daily.  90 tablet  3  . gabapentin (NEURONTIN) 800 MG tablet Take 800 mg by mouth 2 (two) times daily.        . insulin aspart (NOVOLOG) 100 UNIT/ML injection 10-20 Units 3 (three) times daily before meals. Sliding scale as needed with meals (14 units in the morning 12 units at lunch and 24 units at dinner)      . insulin glargine (LANTUS) 100 UNIT/ML injection Inject 80 Units into the skin at bedtime. Adjust as needed      . levothyroxine (LEVOTHROID) 25 MCG tablet Take 1 tablet (25 mcg total) by mouth daily.  90 tablet  3  .  losartan (COZAAR) 100 MG tablet 1/2 by mouth once daily       . metFORMIN (GLUCOPHAGE) 1000 MG tablet Take 1,000 mg by mouth 2 (two) times daily.        . mometasone (ASMANEX 60 METERED DOSES) 220 MCG/INH inhaler Inhale 2 puffs into the lungs daily.        . Multiple Vitamins-Minerals (MEGA MULTIVITAMIN FOR MEN PO) Take by mouth daily.        . naphazoline (CLEAR EYES) 0.012 % ophthalmic solution Place 1 drop into both eyes as needed. Red eyes       . Nebulizers (COMPRESSOR/NEBULIZER) MISC Use as directed Dx 496  1 each  0  . PLAQUENIL 200 MG tablet Take 1 tablet by mouth Daily.      . predniSONE (DELTASONE) 10 MG tablet Take 4 tabs daily x 3 days, then 2 tabs daily x 3, then 1 tab daily x 3, then 1/2 tab daily x3 days, then stop.  23 tablet  0  . simvastatin (ZOCOR) 20 MG tablet Take 5 mg by mouth at bedtime.       . Tamsulosin HCl (FLOMAX) 0.4 MG CAPS Take by mouth at bedtime.        [name] tiotropium (SPIRIVA HANDIHALER) 18 MCG inhalation capsule Place 18 mcg into inhaler and inhale daily.        [name] VIAGRA 100 MG tablet prn      . vardenafil (LEVITRA) 20 MG tablet Take 1 tablet (20 mg total) by mouth as needed for erectile dysfunction.  5 tablet  11   Review of Systems Constitutional: Negative for diaphoresis and unexpected weight change.  HENT: Negative for tinnitus.   Eyes: Negative for photophobia and visual disturbance.  Respiratory: Negative for choking and stridor.   Gastrointestinal: Negative for vomiting and blood in stool.  Genitourinary: Negative for hematuria and decreased urine volume.  Musculoskeletal: Negative for gait problem.  Skin: Negative for color change and wound.  Neurological: Negative for tremors and numbness.       Objective:   Physical Exam Per record from [place] - lying 136/78 (HR 67), sitting 127/69 (HR 74), standing 97/55 (HR 92) BP 124/62  Pulse 85  Temp 98.2 F (36.8 C) (Oral)  Ht 6\' 3"  (1.905 m)  Wt 250 lb (113.399 kg)  BMI 31.25 kg/m2  SpO2 93% Physical  Exam  VS noted, not ill appearing Constitutional: Pt appears well-developed and well-nourished.  HENT: Head: Normocephalic.  Right Ear: External ear normal.  Left Ear: External ear normal.  Eyes: Conjunctivae and EOM are normal. Pupils are equal, round, and reactive to light.  Neck: Normal range of motion. Neck supple.  Cardiovascular: Normal rate and regular rhythm.   Pulmonary/Chest: Effort normal and breath sounds normal.  Abd:  Soft, NT, non-distended, + BS Neurological: Pt is alert. Not confused.  Skin: Skin is warm. No erythema. No LE edema Psychiatric: Pt behavior is normal. Thought content normal.     Assessment & Plan:

## 2012-01-31 NOTE — Assessment & Plan Note (Signed)
Lab Results  Component Value Date   HGBA1C 8.0* 01/26/2012   Less than optimal control recent, declines further change in tx at this time

## 2012-02-02 ENCOUNTER — Encounter: Payer: Self-pay | Admitting: Internal Medicine

## 2012-02-02 ENCOUNTER — Ambulatory Visit (INDEPENDENT_AMBULATORY_CARE_PROVIDER_SITE_OTHER): Payer: Medicare Other | Admitting: Internal Medicine

## 2012-02-02 VITALS — BP 120/68 | HR 71 | Temp 97.0°F | Ht 75.0 in | Wt 252.2 lb

## 2012-02-02 DIAGNOSIS — E119 Type 2 diabetes mellitus without complications: Secondary | ICD-10-CM

## 2012-02-02 DIAGNOSIS — N390 Urinary tract infection, site not specified: Secondary | ICD-10-CM

## 2012-02-02 DIAGNOSIS — I951 Orthostatic hypotension: Secondary | ICD-10-CM

## 2012-02-02 MED ORDER — FUROSEMIDE 40 MG PO TABS: 40.0000 mg | ORAL_TABLET | Freq: Every day | ORAL | Status: DC

## 2012-02-02 NOTE — Patient Instructions (Addendum)
It appears with further information today that your "dry weight" is 250 lbs Please continue to hold on taking your fluid pill unless your weight gets to 252, and do not take if you have dizziness and weakness as you had last week. Continue all other medications as before Please have the pharmacy call with any refills you may need.

## 2012-02-02 NOTE — Assessment & Plan Note (Signed)
Transient, resolved symptoms and exam with holding diuretic and po fluids; appears now his dry wt is 250 at home, to take lasix prn wt up to 252, to avoid lasix for recurrence of orthostasis; picture is complicated by his attempts at calorie reduction and DM a1c improvement which will lower or raise his dry wt and will need to be  monitored

## 2012-02-02 NOTE — Assessment & Plan Note (Signed)
D/w pt elev a1c - to cont current meds, plans to lose wt with calorie reduction and activity, and [facility]

## 2012-02-02 NOTE — Progress Notes (Signed)
Subjective:    Patient ID: [name], male    DOB: [birth year], 71 y.o.   MRN: [id number]  HPI  Here to f/u, unfortunately refused the IVF's ordered for him through home health care when he found it would take 8 hrs of his days for 3 days over the holiday weekend, but fortunately has been taking the antibx for UTI and tolerating well,  Denies urinary symptoms such as dysuria, frequency, urgency,or hematuria.  Also holding on his diuretic lasix 40 qd, and wt with oral fluids has increased to 250 at home (252 here).  No longer orthostatic by symptoms or exam today;  No further c/o nausea , weakness, lightheaded.  Pt denies chest pain, increased sob or doe, wheezing, orthopnea, PND, increased LE swelling, palpitations.  Pt denies new neurological symptoms such as new headache, or facial or extremity weakness or numbness   Pt denies polydipsia, polyuria, despite his chronically elev a1c about 8.   Past Medical History  Diagnosis Date  . THRUSH 11/06/2009  . HYPOTHYROIDISM 07/30/2009  . DIABETES MELLITUS, TYPE II 11/23/2009  . DEPRESSION 11/23/2009  . 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 HYPERTENSION 07/30/2009  . CORONARY ARTERY DISEASE 11/23/2009  . CHRONIC OBSTRUCTIVE PULMONARY DISEASE, ACUTE EXACERBATION 03/01/2010  . C O P D 07/30/2009  . 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 RESPIRATORY FAILURE, CHRONIC 07/31/2009  . BENIGN PROSTATIC HYPERTROPHY 11/23/2009  . DEGENERATIVE JOINT DISEASE 11/23/2009  . 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 FATIGUE 11/23/2009  . 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 GAIT DISTURBANCE 12/10/2009  . DYSPNEA/SHORTNESS OF BREATH 12/08/2009  . HEMOPTYSIS UNSPECIFIED 05/07/2010  . RA (rheumatoid arthritis) 06/11/2011   Past Surgical History  Procedure Date  . Small bowel obstruction repair with adhesiolysis   . Hx of remote ileum resection due to bleeding   . Abdominal aortic aneurysm repair   . S/p back surgury  1972    lumbar laminectomy Dr. Fannie Knee  . S/p left femoral embolectomy with  left leg ischemia     Dr. Hart Rochester, vascular  . S/p right ganglion cyst     Dr. Teressa Senter    reports that he quit smoking about 13 years ago. His smoking use included Cigarettes, Pipe, and Cigars. He has a 100 pack-year smoking history. He has never used smokeless tobacco. He reports that he does not drink alcohol or use illicit drugs. family history is not on file. No Known Allergies Current Outpatient Prescriptions on File Prior to Visit  Medication Sig Dispense Refill  . albuterol (PROAIR HFA) 108 (90 BASE) MCG/ACT inhaler Inhale 2 puffs into the lungs every 4 (four) hours as needed. Shortness of breath  1 Inhaler  6  . albuterol (PROVENTIL) (2.5 MG/3ML) 0.083% nebulizer solution 1 vial in nebulizer every 6 hours as needed Dx 496  120 mL  1  . B-D INS SYR ULTRAFINE 1CC/30G 30G X 1/2" 1 ML MISC       . cephALEXin (KEFLEX) 500 MG capsule Take 1 capsule (500 mg total) by mouth 4 (four) times daily.  40 capsule  0  . Cholecalciferol (VITAMIN D3) 2000 UNITS TABS Take 1 tablet by mouth daily.       [name] DEPO-TESTOSTERONE 200 MG/ML injection Inject 200 mg into the muscle every 28 (twenty-eight) days.       [name] doxepin (SINEQUAN) 50 MG capsule Take 50 mg by mouth at bedtime.        . finasteride (PROSCAR) 5 MG tablet Take 5 mg by mouth daily.        [name]  formoterol (FORADIL AEROLIZER) 12 MCG capsule for inhaler Inhale contents of 1 capsule two times a day       . gabapentin (NEURONTIN) 800 MG tablet Take 800 mg by mouth 2 (two) times daily.        . insulin aspart (NOVOLOG) 100 UNIT/ML injection 10-20 Units 3 (three) times daily before meals. Sliding scale as needed with meals (14 units in the morning 12 units at lunch and 24 units at dinner)      . insulin glargine (LANTUS) 100 UNIT/ML injection Inject 80 Units into the skin at bedtime. Adjust as needed      . metFORMIN (GLUCOPHAGE) 1000 MG tablet Take 1,000 mg by mouth 2 (two) times daily.        . mometasone (ASMANEX 60 METERED DOSES) 220 MCG/INH inhaler  Inhale 2 puffs into the lungs daily.        . Multiple Vitamins-Minerals (MEGA MULTIVITAMIN FOR MEN PO) Take by mouth daily.        . naphazoline (CLEAR EYES) 0.012 % ophthalmic solution Place 1 drop into both eyes as needed. Red eyes       . Nebulizers (COMPRESSOR/NEBULIZER) MISC Use as directed Dx 496  1 each  0  . PLAQUENIL 200 MG tablet Take 1 tablet by mouth Daily.      . predniSONE (DELTASONE) 10 MG tablet Take 4 tabs daily x 3 days, then 2 tabs daily x 3, then 1 tab daily x 3, then 1/2 tab daily x3 days, then stop.  23 tablet  0  . simvastatin (ZOCOR) 20 MG tablet Take 5 mg by mouth at bedtime.       . Tamsulosin HCl (FLOMAX) 0.4 MG CAPS Take by mouth at bedtime.        [name] tiotropium (SPIRIVA HANDIHALER) 18 MCG inhalation capsule Place 18 mcg into inhaler and inhale daily.        [name] VIAGRA 100 MG tablet prn      . DISCONTD: furosemide (LASIX) 40 MG tablet Take 1 tablet (40 mg total) by mouth daily.  90 tablet  3  . levothyroxine (LEVOTHROID) 25 MCG tablet Take 1 tablet (25 mcg total) by mouth daily.  90 tablet  3  . vardenafil (LEVITRA) 20 MG tablet Take 1 tablet (20 mg total) by mouth as needed for erectile dysfunction.  5 tablet  11   Review of Systems Review of Systems  Constitutional: Negative for diaphoresis and unexpected weight change.  HENT: Negative for drooling and tinnitus.   Eyes: Negative for photophobia and visual disturbance.  Respiratory: Negative for choking and stridor.   Gastrointestinal: Negative for vomiting and blood in stool.  Genitourinary: Negative for hematuria and decreased urine volume.  Musculoskeletal: Negative for gait problem.  Skin: Negative for color change and wound.    Objective:   Physical Exam BP 120/68  Pulse 71  Temp 97 F (36.1 C) (Oral)  Ht 6\' 3"  (1.905 m)  Wt 252 lb 4 oz (114.42 kg)  BMI 31.53 kg/m2  SpO2 96% Physical Exam  VS noted, not ill appearing Constitutional: Pt appears well-developed and well-nourished.  HENT: Head:  Normocephalic.  Right Ear: External ear normal.  Left Ear: External ear normal.  Eyes: Conjunctivae and EOM are normal. Pupils are equal, round, and reactive to light.  Neck: Normal range of motion. Neck supple.  Cardiovascular: Normal rate and regular rhythm.   Pulmonary/Chest: Effort normal and breath sounds decreased without rales or wheezing.  Abd:  Soft, NT, non-distended, +  BS Neurological: Pt is alert..  Skin: Skin is warm. No erythema.  Psychiatric: Pt behavior is normal. Thought content normal.     Assessment & Plan:

## 2012-02-02 NOTE — Assessment & Plan Note (Signed)
stable overall by hx and exam, most recent data reviewed with pt, and pt to continue medical treatment as before - to finish the antibx

## 2012-02-02 NOTE — Telephone Encounter (Signed)
lmtcb

## 2012-02-03 NOTE — Telephone Encounter (Signed)
I spoke with [place] and she states she has already received order from dr. Raphael Gibney office to d/c the IV fluid. Nothing was needed

## 2012-03-02 ENCOUNTER — Encounter: Payer: Self-pay | Admitting: Pulmonary Disease

## 2012-03-02 ENCOUNTER — Ambulatory Visit (INDEPENDENT_AMBULATORY_CARE_PROVIDER_SITE_OTHER): Payer: Medicare Other | Admitting: Pulmonary Disease

## 2012-03-02 VITALS — BP 142/72 | HR 49 | Temp 98.1°F | Ht 75.0 in | Wt 250.0 lb

## 2012-03-02 DIAGNOSIS — J9610 Chronic respiratory failure, unspecified whether with hypoxia or hypercapnia: Secondary | ICD-10-CM

## 2012-03-02 DIAGNOSIS — Z9989 Dependence on other enabling machines and devices: Secondary | ICD-10-CM

## 2012-03-02 DIAGNOSIS — G4733 Obstructive sleep apnea (adult) (pediatric): Secondary | ICD-10-CM

## 2012-03-02 NOTE — Progress Notes (Signed)
  Subjective:    Patient ID: [name], male    DOB: 01/21/1941, 71 y.o.   MRN: [id number]  HPI  71 yowm quit smoking around 2000 with GOLD III COPD on 3-4 L o2. Meds through [facility]  He was hospitalised in 10/12 for acute on chronic hypercarbic resp failure. He also has gr 2 diastolic dysfunction & improved with diuresis . He was started on VPAP S auto on discharge [facility])  PSG 10/15 showed severe OSA, AHI 48/h with nadir desatn 78% correctd by CPAP 12 cm, 3 L O2 blended in , c flex +2 cm, humidity, A medium full face mask was used.  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 Desaturations persisted without resp events on CPAP 12 cm   Download 11/19-12/11/12 shows good control of events on CPAP 16 cm, good usage    03/02/2012 7 m FU Last flare 3/13 Underwent Lung transplant eval C/o pressure too high - CPAP was at 12 cm but increased to 16 cm at his request in the past On 2 L at rest  & sleep, pressure ok, mask OK Neurontin bid - around 7pm Monitor wts daily & takes lasix for increase in 2lbs or more   Review of Systems neg for any significant sore throat, dysphagia, itching, sneezing, nasal congestion or excess/ purulent secretions, fever, chills, sweats, unintended wt loss, pleuritic or exertional cp, hempoptysis, orthopnea pnd or change in chronic leg swelling. Also denies presyncope, palpitations, heartburn, abdominal pain, nausea, vomiting, diarrhea or change in bowel or urinary habits, dysuria,hematuria, rash, arthralgias, visual complaints, headache, numbness weakness or ataxia.     Objective:   Physical Exam  Gen. Pleasant, chronically ill,on O2 in no distress ENT - no lesions, no post nasal drip Neck: No JVD, no thyromegaly, no carotid bruits Lungs: no use of accessory muscles, no dullness to percussion, clear without rales or rhonchi  Cardiovascular: Rhythm regular, heart sounds  normal, no murmurs or gallops,  no peripheral edema Musculoskeletal: No deformities, no cyanosis or clubbing        Assessment & Plan:

## 2012-03-02 NOTE — Assessment & Plan Note (Addendum)
Drop pressure to 14 cm Increase C flex to +3 Download in 4 weeks Weight loss encouraged, compliance with goal of at least 4-6 hrs every night is the expectation. Advised against medications with sedative side effects Cautioned against driving when sleepy - understanding that sleepiness will vary on a day to day basis

## 2012-03-02 NOTE — Assessment & Plan Note (Signed)
-  stable on O2

## 2012-03-02 NOTE — Patient Instructions (Addendum)
Drop pressure to 14 cm Increase C flex to +3 Download in 4 weeks

## 2012-03-10 ENCOUNTER — Ambulatory Visit (INDEPENDENT_AMBULATORY_CARE_PROVIDER_SITE_OTHER): Payer: Medicare Other | Admitting: Internal Medicine

## 2012-03-10 ENCOUNTER — Encounter: Payer: Self-pay | Admitting: Internal Medicine

## 2012-03-10 VITALS — BP 112/80 | HR 61 | Temp 97.2°F | Ht 75.0 in | Wt 254.2 lb

## 2012-03-10 DIAGNOSIS — I10 Essential (primary) hypertension: Secondary | ICD-10-CM

## 2012-03-10 DIAGNOSIS — N529 Male erectile dysfunction, unspecified: Secondary | ICD-10-CM

## 2012-03-10 DIAGNOSIS — F4310 Post-traumatic stress disorder, unspecified: Secondary | ICD-10-CM | POA: Insufficient documentation

## 2012-03-10 DIAGNOSIS — F329 Major depressive disorder, single episode, unspecified: Secondary | ICD-10-CM

## 2012-03-10 HISTORY — DX: Post-traumatic stress disorder, unspecified: F43.10

## 2012-03-10 MED ORDER — VARDENAFIL HCL 20 MG PO TABS: 20.0000 mg | ORAL_TABLET | ORAL | Status: DC | PRN

## 2012-03-10 NOTE — Progress Notes (Signed)
Subjective:    Patient ID: [name], male    DOB: 11/16/1940, 71 y.o.   MRN: [id number]  HPI  Here to f/ju -  On Home o2 for about 2.5 yrs.at 2L, up to 4L with exertion per pt., has seen [facility] for lung transplant team, did the 6 min walk and rec'd even up to 5-6 L, not sick enough for the lung transplant, to f/u nov 2013 there, needs to lose 25 lbs and better DM control to be considered lesser risk.   Wants approval to be able to be active at a gym on his own. Has been to pulm rehab and graudated about about a yr ago. Otherwise overall doing ok,  Pt denies chest pain, increased sob or doe, wheezing, orthopnea, PND, increased LE swelling, palpitations, dizziness or syncope.  Pt denies new neurological symptoms such as new headache, or facial or extremity weakness or numbness   Pt denies polydipsia, polyuria, or low sugar symptoms such as weakness or confusion improved with po intake.  Pt states overall good compliance with meds, trying to follow lower cholesterol, diabetic diet, wt overall stable but little exercise however. ECG July 2013 with LBBB, last stress test several yrs ago ("chemcal" stress test), and [facility] cardiac cath about 22yrs ago per pt neg for obstructive CAD.  Wife sees Dr Kaur/psychiatry, and has c/o [name] stress moments/fly off the handle/anger  - brief outburts, and rec'd counseling with a collegue and needs referral today. Denies worsening depressive symptoms, suicidal ideation, or panic.  Takes doxepin regularly.  Was in [place]  Yrs ago, been dx with PTSD per pt. [facility] success only - a1c 8.0  In July 2013.  Also has appt for f/u [facility] for pulm fibrosis soon, to see if anything else can be done beyond tx per Dr Vassie Loll (was there 2 wks ago, has f/u appt tomorrow), then Nov for transplant team f/u.    Past Medical History  Diagnosis Date  . THRUSH 11/06/2009  . HYPOTHYROIDISM 07/30/2009  . DIABETES MELLITUS, TYPE II 11/23/2009  .  DEPRESSION 11/23/2009  . 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 HYPERTENSION 07/30/2009  . CORONARY ARTERY DISEASE 11/23/2009  . CHRONIC OBSTRUCTIVE PULMONARY DISEASE, ACUTE EXACERBATION 03/01/2010  . C O P D 07/30/2009  . 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 RESPIRATORY FAILURE, CHRONIC 07/31/2009  . BENIGN PROSTATIC HYPERTROPHY 11/23/2009  . DEGENERATIVE JOINT DISEASE 11/23/2009  . 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 FATIGUE 11/23/2009  . 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 GAIT DISTURBANCE 12/10/2009  . DYSPNEA/SHORTNESS OF BREATH 12/08/2009  . HEMOPTYSIS UNSPECIFIED 05/07/2010  . RA (rheumatoid arthritis) 06/11/2011   Past Surgical History  Procedure Date  . Small bowel obstruction repair with adhesiolysis   . Hx of remote ileum resection due to bleeding   . Abdominal aortic aneurysm repair   . S/p back surgury  1972    lumbar laminectomy Dr. Fannie Knee  . S/p left femoral embolectomy with left leg ischemia     Dr. Hart Rochester, vascular  . S/p right ganglion cyst     Dr. Teressa Senter    reports that he quit smoking about 13 years ago. His smoking use included Cigarettes, Pipe, and Cigars. He has a 100 pack-year smoking history. He has never used smokeless tobacco. He reports that he does not drink alcohol or use illicit drugs. family history is not on file. No Known Allergies Current Outpatient Prescriptions on File Prior to Visit  Medication Sig Dispense Refill  . albuterol (PROAIR HFA) 108 (90 BASE) MCG/ACT inhaler Inhale 2 puffs into the lungs every 4 (four) hours as needed. Shortness of breath  1 Inhaler  6  . albuterol (PROVENTIL) (2.5 MG/3ML) 0.083% nebulizer solution 1 vial in nebulizer every 6 hours as needed Dx 496  120 mL  1  . B-D INS SYR ULTRAFINE 1CC/30G 30G X 1/2" 1 ML MISC       . Cholecalciferol (VITAMIN D3) 2000 UNITS TABS Take 1 tablet by mouth daily.       [name] DEPO-TESTOSTERONE 200 MG/ML injection Inject 200 mg into the muscle every 28 (twenty-eight) days.       [name] doxepin (SINEQUAN) 50 MG capsule Take  50 mg by mouth at bedtime.        . finasteride (PROSCAR) 5 MG tablet Take 5 mg by mouth daily.        . formoterol (FORADIL AEROLIZER) 12 MCG capsule for inhaler Inhale contents of 1 capsule two times a day       . furosemide (LASIX) 40 MG tablet Take 1 tablet (40 mg total) by mouth daily. As needed for weight over 252 lbs  90 tablet  3  . gabapentin (NEURONTIN) 800 MG tablet Take 800 mg by mouth 2 (two) times daily.        . insulin aspart (NOVOLOG) 100 UNIT/ML injection 10-20 Units 3 (three) times daily before meals. Sliding scale as needed with meals (14 units in the morning 12 units at lunch and 24 units at dinner)      . insulin glargine (LANTUS) 100 UNIT/ML injection Inject 80 Units into the skin at bedtime. Adjust as needed      . levothyroxine (LEVOTHROID) 25 MCG tablet Take 1 tablet (25 mcg total) by mouth daily.  90 tablet  3  . metFORMIN (GLUCOPHAGE) 1000 MG tablet Take 1,000 mg by mouth 2 (two) times daily.        . mometasone (ASMANEX 60 METERED DOSES) 220 MCG/INH inhaler Inhale 2 puffs into the lungs daily.        . Multiple Vitamins-Minerals (MEGA MULTIVITAMIN FOR MEN PO) Take by mouth daily.        . naphazoline (CLEAR EYES) 0.012 % ophthalmic solution Place 1 drop into both eyes as needed. Red eyes       . Nebulizers (COMPRESSOR/NEBULIZER) MISC Use as directed Dx 496  1 each  0  . PLAQUENIL 200 MG tablet Take 1 tablet by mouth Daily.      . predniSONE (DELTASONE) 10 MG tablet Take 4 tabs daily x 3 days, then 2 tabs daily x 3, then 1 tab daily x 3, then 1/2 tab daily x3 days, then stop.  23 tablet  0  . simvastatin (ZOCOR) 20 MG tablet Take 5 mg by mouth at bedtime.       . Tamsulosin HCl (FLOMAX) 0.4 MG CAPS Take by mouth at bedtime.        [name] tiotropium (SPIRIVA HANDIHALER) 18 MCG inhalation capsule Place 18 mcg into inhaler and inhale daily.        . traMADol (ULTRAM) 50 MG tablet as needed.      . vardenafil (LEVITRA) 20 MG tablet Take 1 tablet (20 mg total) by mouth as needed  for erectile dysfunction.  5 tablet  11  . VIAGRA 100 MG tablet prn       Review of Systems Review of Systems  Constitutional: Negative for diaphoresis and unexpected weight  change.  HENT: Negative for drooling and tinnitus.   Eyes: Negative for photophobia and visual disturbance.  Respiratory: Negative for choking and stridor.   Gastrointestinal: Negative for vomiting and blood in stool.  Genitourinary: Negative for hematuria and decreased urine volume.  Musculoskeletal: Negative for acute joint swelling Skin: Negative for color change and wound.  Neurological: Negative for tremors and numbness.  Psychiatric/Behavioral: Negative for decreased concentration. The patient is not hyperactive.      Objective:   Physical Exam BP 112/80  Pulse 61  Temp 97.2 F (36.2 C) (Oral)  Ht 6\' 3"  (1.905 m)  Wt 254 lb 4 oz (115.327 kg)  BMI 31.78 kg/m2  SpO2 91% Physical Exam  VS noted, not ill appearing today, on Home O2 Constitutional: Pt appears well-developed and well-nourished.  HENT: Head: Normocephalic.  Right Ear: External ear normal.  Left Ear: External ear normal.  Eyes: Conjunctivae and EOM are normal. Pupils are equal, round, and reactive to light.  Neck: Normal range of motion. Neck supple.  Cardiovascular: Normal rate and regular rhythm.   Pulmonary/Chest: Effort normal and breath sounds decreased bilat.  Neurological: Pt is alert. Not confused Skin: Skin is warm. No erythema.  Psychiatric: Pt behavior is normal. Thought content normal.     Assessment & Plan:

## 2012-03-10 NOTE — Assessment & Plan Note (Signed)
Declines change in med tx, for referral per pt request

## 2012-03-10 NOTE — Assessment & Plan Note (Signed)
stable overall by hx and exam, most recent data reviewed with pt, and pt to continue medical treatment as before  Lab Results  Component Value Date   WBC 6.6 01/26/2012   HGB 12.6* 01/26/2012   HCT 38.9* 01/26/2012   PLT 163.0 01/26/2012   GLUCOSE 168* 01/26/2012   CHOL 155 02/18/2011   TRIG 107 02/18/2011   HDL 37* 02/18/2011   LDLCALC 97 02/18/2011   ALT 24 01/26/2012   AST 25 01/26/2012   NA 138 01/26/2012   K 5.0 01/26/2012   CL 97 01/26/2012   CREATININE 1.1 01/26/2012   BUN 18 01/26/2012   CO2 30 01/26/2012   TSH 0.292* 02/18/2011   PSA 0.67 11/23/2009   INR 0.94 05/04/2011   HGBA1C 8.0* 01/26/2012

## 2012-03-10 NOTE — Assessment & Plan Note (Signed)
stable overall by hx and exam, most recent data reviewed with pt, and pt to continue medical treatment as before / BP Readings from Last 3 Encounters:  03/10/12 112/80  03/02/12 142/72  02/02/12 120/68

## 2012-03-10 NOTE — Patient Instructions (Addendum)
Continue all other medications as before Please keep your appointments with your specialists as you have planned Your levitra is refilled today as reqeusted You will be contacted regarding the referral for: psychology  Please return in 6 months, or sooner if needed

## 2012-03-10 NOTE — Assessment & Plan Note (Signed)
Lost recent rx - for levitra refill,  to f/u any worsening symptoms or concerns

## 2012-04-09 ENCOUNTER — Ambulatory Visit: Payer: Medicare Other | Admitting: Internal Medicine

## 2012-04-20 ENCOUNTER — Ambulatory Visit (INDEPENDENT_AMBULATORY_CARE_PROVIDER_SITE_OTHER): Payer: Medicare Other | Admitting: Internal Medicine

## 2012-04-20 ENCOUNTER — Encounter: Payer: Self-pay | Admitting: Internal Medicine

## 2012-04-20 ENCOUNTER — Telehealth: Payer: Self-pay | Admitting: Internal Medicine

## 2012-04-20 VITALS — BP 152/80 | HR 58 | Temp 98.4°F | Ht 75.0 in | Wt 251.8 lb

## 2012-04-20 DIAGNOSIS — E669 Obesity, unspecified: Secondary | ICD-10-CM

## 2012-04-20 DIAGNOSIS — J449 Chronic obstructive pulmonary disease, unspecified: Secondary | ICD-10-CM

## 2012-04-20 NOTE — Progress Notes (Signed)
Subjective:    Patient ID: [name], male    DOB: 1941-05-28, 71 y.o.   MRN: [id number]  HPI 89 yowm quit smoking around 2000 with GOLD III COPD by PFT's 09/2009. Meds through [facility]  June 18, 2010: EX 100 pack year smoker. Home O2 dependent Gold stage 3 COPD with low DLCO 35%  in PFTs MArch 2011. BAeline pulse ox is 87-93% on 2L [place]. In,  OCt 2011 Ct showing some UIP component but predmoninant COPD on CT. 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 Sherene Sires to myself.  In Oct 2011 had some mild AECOPD related hemoptysis with CT showing only UIP but not showing any mass. Since then hemoptysis resolved. Currently feels baseline on 2-3L [place]. Claudication lmits him more than dyspnea at 1/4 th mile. Denies chronic cough but on mucinex and feels it helps. On spiriva, foradil and asmanex through [facility]. Has not done rehab though wife is attending rehab. Denies chest pains, edema, wheezing, fever, colored sputum. REC: CHECK AUTOIMMUNE PROFILE,  START NAC, GET FULL PFTs   August 12, 2010: Followup. in interim was admitted for sBP12/19/2011 due to [facility] from prior laparotomies. Resolved with conservative Rx. Started pulmnary rehab 2 weeks ago. Attend 4 sesssions. It is helping so far and he likes it. No new complaints. Dyspnea is stable class 2-3. Denies associated cough, chest pain, hemoptysis, edema, sputum, weight loss. Had autoimmune profile 07/03/2010 - CRP 10, RF 352 but otherwise negative. Upon revealing labs he states he is known to have RA diagnosed over 10 years ago and was on enbrel. He is due to see a rheumatologist at [place], [place]. PFTs today continue to show Gold stage 3 COPD and somewhat improved from a year ago.  August 27, 2010 --Presents for an acute office visit.Complains of increased sob,  dry cough, wheezing. Pt states he doesn't feel like he is getting enough oxygen -cant get enough air. Retaining more fluid.  Denies chest pain, orthopnea, hemoptysis, fever, n/v/d,  headache,recent travel. RX: DOXY AND PRED   OV 02/21/2011: Admitted 7/22 through 7/26 for Acute-on-chronic hypoxemia, probably with chronic obstructive       pulmonary disease exacerbation, pneumonia, acute congestive heart      Failure, (presented with dyspnea, edema, cough). est x-ray on July 22 yield chronic lung changes without acute      overlying pulmonary process.  A 2-D echo yields an ejection fraction of 55% to 60% with grade 2      diastolic dysfunction.  Mitral valve with moderately calcified      Annulus. ( I personally reviewed thos results)  Moderately thickened leaflets. RX: DIURESIS, PRED, AVELOX and O2   Today  Followup from above hospitalization. States he no longer has worsening dyspnea, edema, sputum, cough, fever, chills. Back to baseline o2 need of 3L [place]. However, feels fatigued today more than yesterday. Feels is due to benadryl which he is taking for insomnia. Was talking Remus Loffler but he was falling as a result and stopped. He has class 3 dyspnea at baseline. Has completed rehab.  Compliant with meds>>no changes  03/20/11  Acute OV  Complains of persistent leg swelling. Increased DOE with actiivity . Wears out easily. Lasix has been increased to 40mg  daily with limitedresponse. Weight trending up , now up 10 lbs over last month. Does have cards consult next week. Previous echo last month during hospital admit showed LV nml w/ grade 2 diastolic dysfnx. No chest pain ,  increaesed cough or congestion  >>Aldactone 25mg  Twice daily  rx   04/29/2011 Follow up  Pt returns for follow up. Last ov with increased leg swelling despite lasix 40mg . Aldactone 25mg  Twice daily was added to his regimen. He says this did help some however was taken off per cardiology recommendations. He says leg swelling is about the same.  Has gotten some TEDS stocking to wear daily .   Does complain of increased congestion for last few days with green mucus.  Wife was recently tx for bronchitis. NO fever or hemoptysis. No recent abx use. Taking spiriva , foradil and asmanex without known difficulty-gets meds via [place] system.  >>STOP ALDACTONE per DR Nelda Severe  05/09/2011 Post hospital  Pt returns for close follow up from hospitalization. Admitted 10/7-10/11 for acute on chronic resp failure w/ ILD flare, CHF w/ vol overload, and possible CAP along w/ supsected OSA /hypercarbia.  He was tx w/ initial BIPAP w/ transition to nocturnal BIPAP for suspected OSA w/  Associated hypercarbia. Along w/ pulmonary hygiene, IV abx and steroids. HE was aggressively  Diuresised w/ improvement in vol overload. Today bmet  shows improved renal insufficiency .  XRAy showed increased consolidation predominately in bases.  He returns today improved w/ weight loss of >10lbs w/ substantial decreased in edema.  Feels his breathing is back to his baseline.  No wheezing or hemoptysis. No chest pain or fever.  >>pred taper , lasix 40mg  , sleep study and referral to [facility]  OV 05/22/11: Followup for above. Recent AECOPD. Suspected OSA; sent hom on bipap/cpap which he says is helping sleep quality and wakefulness immensely. Thinks sleep study is abnromal. He is due to see Dr Vassie Loll soon for it. Denies resp discoomfort. Still bothered by cosmetic effects of edema. He is frustrated that despite lasix and fluid restriction it still persists (has varicose veins). Advised him to see what happens with OSA Rx. He also wants to know how to prevent AECOPD >>  06/11/11 Acute OV  Complains of increased SOB, prod cough with clear mucus, body aches, weight increase of 4# x2days. Has increaesed edema and weight is up 4 lbs. He is more dyspneic with activity. We reviewed his meds . He did eat quite a bit of salty food yesterday. We discussed low salt diet.  Wants to catch his breathing before it gets too bad. No discolored mucus or fever.   REC Increase Lasix 40mg  1 extra tablet for next 2 days  Low  salt diet  Legs elevated.  Follow up Dr. Vassie Loll And Marchelle Gearing As planned  Please contact office for sooner follow up if symptoms do not improve or worsen or seek emergency care   OV 08/27/2011 Called in for AECOPD RX 07/11/11. Saw Dr Vassie Loll for sleep 08/06/11 and felt he was doing well on CPAP 16. Today reports 3 days increased dyspnea; was walking around the block with his 3L o2 but now able to hardly do mailbox and back. Slight associated increase in wheeze and sputum volume but no change in sputum color or cough. Feels like he is in AECOPD Denies fever, chest pain, or edema worsening. Spirometry today: Fev1 1.3L/30% predicted, ratio 48. He is interested in lung transplant. Says a friend who is diabetic got transplanted for COPD at [facility] and is doing well. Says he recollects me telling him that he is a poor lung transplant candidate (I do not remember that) and that his hope is being taken away  Past, Family, Social reviewed: no change  since last visit except as in HPI   REC You have mild attack of copd called COPD exacerbation  Please take levaquin500 mg once daily x 6days; avoid sunlight  Please take prednisone 40mg  once daily x 3 days, then 20mg  once daily x 3 days, then 10mg  once daily x 3 days, then 5mg  once dailyx 3 days and stop  Check alpha 1 today  REturn in 1 month with full PFT  I will touch base with [facility] Transplant team for a pre-evaluation  OV 09/23/2011 PFTS 09/23/2011 shows gold stage 3 copd. fev1 1.55L/47%, Ratio 45, TLC 7L/95%, DLCO 10/38%, 8% BD response in fev1. This is improved 250cc in fev1 compared to last visit when he was in AECOPD. Currently well and baseline. After last OV I spoke t [facility] pre-transplant evaluation MD who has requested his charts. I believe my office should have sent them these. Patient has not heard from them.  Past, Family, Social reviewed: no change since last visit  REC You have severe copd  THe lung function is improved from last visit and baseline  today at 47-50% of capacity  Continue oxygen and medications  Nurse will ensure that referral paper work was received by [facility] transplant team for their review  Return in 6 months or sooner if needed  OV 10/08/2011  Past 2 days is havig increased chest congestion, runny nose, runny eyes, cold, wheeze, cough and yellow sputum and dyspnea all c/w prior AECOPD. Rates it as moderate severity but not severe enough he needs admission. Denies edema, chest pain, syncope  Past, Family, Social reviewed: He had rt eye surgery by Dr Dione Booze and is currently on prednisone 30mg  (day 4) through 3/18 and then off, and restarts on 3/25 for surgery April on left eye on April 1st. I d/w Dr Dione Booze who says steroids based on Dr [facility] of [facility] for prevention of scleritis. Dr Dione Booze ok with pred taper through 10/22/11 for AECOPD CHEST - 2 VIEW  Comparison: Plain films of the chest 05/07/2010 and 05/06/2011. CT  chest 05/08/2010.  Findings: Pleural and parenchymal scarring in the lower right chest  is unchanged. Lungs otherwise clear. Heart size is normal. No  pneumothorax or effusion.  IMPRESSION:  No acute finding. Stable compared prior exams.  Original Report Authenticated By: Bernadene Bell. D'ALESSIO, M.D.    10/17/11- 70 yowm quit smoking around 2000 with GOLD III COPD by PFT's 09/2009. Meds through [facility] Acute VISIT. - Dr Maple Hudson-  MR patient-seen 1 wk ago-SOB, cough-given pred and abx(completed) and still having issues; heaviness in mid-chest. Cough thick white sputum, increased wheeze, no fever, edema, palpitation, pain or blood. Last week had nebulizer treatment, Depo-Medrol, doxycycline and prednisone taper. No better now. Using his nebulizer 4 times daily. He feels he was doing very well until cataract surgery 3 weeks ago. He is now due to have his other eye done on April 1. His ophthalmologist, treating scleritis, is using prednisolone eyedrops and prednisone 30 mg daily for 2 weeks to start on March  25. Prednisone does not seem to have affected respiratory complaints. Took Lasix today. Sleeping with CPAP 16 plus oxygen all night every night. CXR today/ 10/17/11- reviewed w/ him: Findings: Pleural and parenchymal scarring in the lower right chest  is unchanged. Lungs otherwise clear. Heart size is normal. No  pneumothorax or effusion.  IMPRESSION:  No acute finding. Stable compared prior exams.  Original Report Authenticated By: Bernadene Bell. D'ALESSIO, M.D.   Sudie Grumbling 04/20/2012  Body mass index  is 31.47 kg/(m^2). - current weight 251#  COPD : Went to [facility] transplant eval. Advised to lose 25# and get into rehab program. Since then going to gyms 5 days a week; walking 2 miles  A day with oxygen and doing resistance 3 days a week. Feels healthy and accomplished and positive mood but weight not coming off. He is frustrated. Says he does "not eat much" and and "eats 6 times a day". Today, main focus is to lose weight. Insists diet is all healthy but on detailed questioning easily apparent diet is full of moderate and high glycemic foods  Overall COPD is stable. Doing 5L [place] at guym  Will have flu shot at [facility]  He has had pneumovax  No new problems since last visit. No ER visits, No UC visits, No hospitalizations. Only issue was UTI in interim; now resolved with antibiotics  Review of Systems  Constitutional: Negative for fever and unexpected weight change.  HENT: Negative for ear pain, nosebleeds, congestion, sore throat, rhinorrhea, sneezing, trouble swallowing, dental problem, postnasal drip and sinus pressure.   Eyes: Negative for redness and itching.  Respiratory: Negative for cough, chest tightness, shortness of breath and wheezing.   Cardiovascular: Negative for palpitations and leg swelling.  Gastrointestinal: Negative for nausea and vomiting.  Genitourinary: Negative for dysuria.  Musculoskeletal: Negative for joint swelling.  Skin: Negative for rash.  Neurological: Negative for  headaches.  Hematological: Does not bruise/bleed easily.  Psychiatric/Behavioral: Negative for dysphoric mood. The patient is not nervous/anxious.        Objective:   Physical Exam  Nursing note and vitals reviewed. Constitutional: He is oriented to person, place, and time. He appears well-developed and well-nourished. No distress.       Looks more cheerful  Body mass index is 31.47 kg/(m^2).   HENT:  Head: Normocephalic and atraumatic.  Right Ear: External ear normal.  Left Ear: External ear normal.  Mouth/Throat: Oropharynx is clear and moist. No oropharyngeal exudate.  Eyes: Conjunctivae normal and EOM are normal. Pupils are equal, round, and reactive to light. Right eye exhibits no discharge. Left eye exhibits no discharge. No scleral icterus.  Neck: Normal range of motion. Neck supple. No JVD present. No tracheal deviation present. No thyromegaly present.  Cardiovascular: Normal rate, regular rhythm and intact distal pulses.  Exam reveals no gallop and no friction rub.   No murmur heard. Pulmonary/Chest: Effort normal and breath sounds normal. No respiratory distress. He has no wheezes. He has no rales. He exhibits no tenderness.  Abdominal: Soft. Bowel sounds are normal. He exhibits no distension and no mass. There is no tenderness. There is no rebound and no guarding.  Musculoskeletal: Normal range of motion. He exhibits no edema and no tenderness.  Lymphadenopathy:    He has no cervical adenopathy.  Neurological: He is alert and oriented to person, place, and time. He has normal reflexes. No cranial nerve deficit. Coordination normal.  Skin: Skin is warm and dry. No rash noted. He is not diaphoretic. No erythema. No pallor.  Psychiatric: He has a normal mood and affect. His behavior is normal. Judgment and thought content normal.                Assessment & Plan:

## 2012-04-20 NOTE — Assessment & Plan Note (Signed)
#  COPD   Glad is stable  continue exercises, medication, oxygen Get flu shot at [facility]

## 2012-04-20 NOTE — Patient Instructions (Addendum)
#  COPD   Glad is stable  continue exercises, medication, oxygen Get flu shot at [facility]   #Weight management   we discussed extensively about weight management : your diet has too much sugar in it  - follow low glycemic diet plan that I outlined for you after extensive discussion  - For breakfast   - recommend steel cut oat meal or fiber one 60 cal (half to one cup) with low sugar 60 cal silk soymilk and some berries and one egg  - For snacks:  with berries or low glycemic fruit or [employer];s mens health 35gm mixed-nut pack  - lunch and dinner - unlimited non-starchy vegetable (prefer raw fresh or roasted or grilled) with skinless chicken or fish   - drink lot of water  - avoid all moderate and high glycemic foods  - measure weight once  A week  - change your milk to SILK unsweetened soy milk 60cal  - with above plan, you might not need your diabetic drink    #Followup  2 months to report copd and weight loss

## 2012-04-20 NOTE — Telephone Encounter (Signed)
[name]  Please tell him that when he takes his whey protein - that there is no need to mix with milk. Whey is a milk protein isolate and gets benefit from they whey itself. He can just mix with water  Another disdavantage of mixing with milk even in non-fat milk is that he is getting needless sugar  Also, I told him how to calculate bad carbs (total carb - fiber). I have advised him to limit <35-45 gm/meal. I want to rrevise that to <35-40gm/meal and total < 125gm/day

## 2012-04-20 NOTE — Assessment & Plan Note (Signed)
#  Weight management   we discussed extensively about weight management : your diet has too much sugar in it  - follow low glycemic diet plan that I outlined for you after extensive discussion  - For breakfast   - recommend steel cut oat meal or fiber one 60 cal (half to one cup) with low sugar 60 cal silk soymilk and some berries and one egg  - For snacks:  with berries or low glycemic fruit or [employer];s mens health 35gm mixed-nut pack  - lunch and dinner - unlimited non-starchy vegetable (prefer raw fresh or roasted or grilled) with skinless chicken or fish   - drink lot of water  - avoid all moderate and high glycemic foods  - measure weight once  A week  - change your milk to SILK unsweetened soy milk 60cal  - with above plan, you might not need your diabetic drink    #Followup  2 months to report copd and weight loss    > 50% of this > 25 min visit spent in face to face counseling (15 min visit converted to 25 min)

## 2012-04-21 NOTE — Telephone Encounter (Signed)
[email or web address].Jennifer Castillo, CMA  

## 2012-04-22 NOTE — Telephone Encounter (Signed)
Spoke with pt and notified of recs per MR He verbalized understanding and states that nothing further needed

## 2012-05-31 ENCOUNTER — Encounter: Payer: Self-pay | Admitting: Internal Medicine

## 2012-05-31 ENCOUNTER — Ambulatory Visit (INDEPENDENT_AMBULATORY_CARE_PROVIDER_SITE_OTHER): Payer: Medicare Other | Admitting: Internal Medicine

## 2012-05-31 VITALS — BP 120/60 | HR 58 | Temp 98.0°F | Ht 75.0 in | Wt 243.0 lb

## 2012-05-31 DIAGNOSIS — E669 Obesity, unspecified: Secondary | ICD-10-CM

## 2012-05-31 DIAGNOSIS — J441 Chronic obstructive pulmonary disease with (acute) exacerbation: Secondary | ICD-10-CM

## 2012-05-31 MED ORDER — ALBUTEROL SULFATE (2.5 MG/3ML) 0.083% IN NEBU: INHALATION_SOLUTION | RESPIRATORY_TRACT | Status: DC

## 2012-05-31 MED ORDER — PREDNISONE 10 MG PO TABS: ORAL_TABLET | ORAL | Status: DC

## 2012-05-31 MED ORDER — LEVOFLOXACIN 500 MG PO TABS: 500.0000 mg | ORAL_TABLET | Freq: Every day | ORAL | Status: DC

## 2012-05-31 NOTE — Patient Instructions (Addendum)
#  AECOPD  take levaquin 500mg  once daily  X 5 days Please take Take prednisone 40mg  once daily x 3 days, then 30mg  once daily x 3 days, then 20mg  once daily x 3 days, then prednisone 10mg  once daily  x 3 days and stop  #COPD   - continue your oxygen and medications  - CMA will ensure refills  #Weight and exercise mgmt  - continue the good work - you have lost significant weight  #Followup - 6-8 weeks or keep the end of month of appt; your choice

## 2012-05-31 NOTE — Assessment & Plan Note (Signed)
#  Weight and exercise mgmt  - continue the good work - you have lost significant weight

## 2012-05-31 NOTE — Progress Notes (Signed)
Subjective:    Patient ID: [name], male    DOB: 1941-05-28, 71 y.o.   MRN: [id number]  HPI 89 yowm quit smoking around 2000 with GOLD III COPD by PFT's 09/2009. Meds through [facility]  June 18, 2010: EX 100 pack year smoker. Home O2 dependent Gold stage 3 COPD with low DLCO 35%  in PFTs MArch 2011. BAeline pulse ox is 87-93% on 2L [place]. In,  OCt 2011 Ct showing some UIP component but predmoninant COPD on CT. 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 Sherene Sires to myself.  In Oct 2011 had some mild AECOPD related hemoptysis with CT showing only UIP but not showing any mass. Since then hemoptysis resolved. Currently feels baseline on 2-3L [place]. Claudication lmits him more than dyspnea at 1/4 th mile. Denies chronic cough but on mucinex and feels it helps. On spiriva, foradil and asmanex through [facility]. Has not done rehab though wife is attending rehab. Denies chest pains, edema, wheezing, fever, colored sputum. REC: CHECK AUTOIMMUNE PROFILE,  START NAC, GET FULL PFTs   August 12, 2010: Followup. in interim was admitted for sBP12/19/2011 due to [facility] from prior laparotomies. Resolved with conservative Rx. Started pulmnary rehab 2 weeks ago. Attend 4 sesssions. It is helping so far and he likes it. No new complaints. Dyspnea is stable class 2-3. Denies associated cough, chest pain, hemoptysis, edema, sputum, weight loss. Had autoimmune profile 07/03/2010 - CRP 10, RF 352 but otherwise negative. Upon revealing labs he states he is known to have RA diagnosed over 10 years ago and was on enbrel. He is due to see a rheumatologist at [place], [place]. PFTs today continue to show Gold stage 3 COPD and somewhat improved from a year ago.  August 27, 2010 --Presents for an acute office visit.Complains of increased sob,  dry cough, wheezing. Pt states he doesn't feel like he is getting enough oxygen -cant get enough air. Retaining more fluid.  Denies chest pain, orthopnea, hemoptysis, fever, n/v/d,  headache,recent travel. RX: DOXY AND PRED   OV 02/21/2011: Admitted 7/22 through 7/26 for Acute-on-chronic hypoxemia, probably with chronic obstructive       pulmonary disease exacerbation, pneumonia, acute congestive heart      Failure, (presented with dyspnea, edema, cough). est x-ray on July 22 yield chronic lung changes without acute      overlying pulmonary process.  A 2-D echo yields an ejection fraction of 55% to 60% with grade 2      diastolic dysfunction.  Mitral valve with moderately calcified      Annulus. ( I personally reviewed thos results)  Moderately thickened leaflets. RX: DIURESIS, PRED, AVELOX and O2   Today  Followup from above hospitalization. States he no longer has worsening dyspnea, edema, sputum, cough, fever, chills. Back to baseline o2 need of 3L [place]. However, feels fatigued today more than yesterday. Feels is due to benadryl which he is taking for insomnia. Was talking Remus Loffler but he was falling as a result and stopped. He has class 3 dyspnea at baseline. Has completed rehab.  Compliant with meds>>no changes  03/20/11  Acute OV  Complains of persistent leg swelling. Increased DOE with actiivity . Wears out easily. Lasix has been increased to 40mg  daily with limitedresponse. Weight trending up , now up 10 lbs over last month. Does have cards consult next week. Previous echo last month during hospital admit showed LV nml w/ grade 2 diastolic dysfnx. No chest pain ,  increaesed cough or congestion  >>Aldactone 25mg  Twice daily  rx   04/29/2011 Follow up  Pt returns for follow up. Last ov with increased leg swelling despite lasix 40mg . Aldactone 25mg  Twice daily was added to his regimen. He says this did help some however was taken off per cardiology recommendations. He says leg swelling is about the same.  Has gotten some TEDS stocking to wear daily .   Does complain of increased congestion for last few days with green mucus.  Wife was recently tx for bronchitis. NO fever or hemoptysis. No recent abx use. Taking spiriva , foradil and asmanex without known difficulty-gets meds via [place] system.  >>STOP ALDACTONE per DR Nelda Severe  05/09/2011 Post hospital  Pt returns for close follow up from hospitalization. Admitted 10/7-10/11 for acute on chronic resp failure w/ ILD flare, CHF w/ vol overload, and possible CAP along w/ supsected OSA /hypercarbia.  He was tx w/ initial BIPAP w/ transition to nocturnal BIPAP for suspected OSA w/  Associated hypercarbia. Along w/ pulmonary hygiene, IV abx and steroids. HE was aggressively  Diuresised w/ improvement in vol overload. Today bmet  shows improved renal insufficiency .  XRAy showed increased consolidation predominately in bases.  He returns today improved w/ weight loss of >10lbs w/ substantial decreased in edema.  Feels his breathing is back to his baseline.  No wheezing or hemoptysis. No chest pain or fever.  >>pred taper , lasix 40mg  , sleep study and referral to [facility]  OV 05/22/11: Followup for above. Recent AECOPD. Suspected OSA; sent hom on bipap/cpap which he says is helping sleep quality and wakefulness immensely. Thinks sleep study is abnromal. He is due to see Dr Vassie Loll soon for it. Denies resp discoomfort. Still bothered by cosmetic effects of edema. He is frustrated that despite lasix and fluid restriction it still persists (has varicose veins). Advised him to see what happens with OSA Rx. He also wants to know how to prevent AECOPD >>  06/11/11 Acute OV  Complains of increased SOB, prod cough with clear mucus, body aches, weight increase of 4# x2days. Has increaesed edema and weight is up 4 lbs. He is more dyspneic with activity. We reviewed his meds . He did eat quite a bit of salty food yesterday. We discussed low salt diet.  Wants to catch his breathing before it gets too bad. No discolored mucus or fever.   REC Increase Lasix 40mg  1 extra tablet for next 2 days  Low  salt diet  Legs elevated.  Follow up Dr. Vassie Loll And Marchelle Gearing As planned  Please contact office for sooner follow up if symptoms do not improve or worsen or seek emergency care   OV 08/27/2011 Called in for AECOPD RX 07/11/11. Saw Dr Vassie Loll for sleep 08/06/11 and felt he was doing well on CPAP 16. Today reports 3 days increased dyspnea; was walking around the block with his 3L o2 but now able to hardly do mailbox and back. Slight associated increase in wheeze and sputum volume but no change in sputum color or cough. Feels like he is in AECOPD Denies fever, chest pain, or edema worsening. Spirometry today: Fev1 1.3L/30% predicted, ratio 48. He is interested in lung transplant. Says a friend who is diabetic got transplanted for COPD at [facility] and is doing well. Says he recollects me telling him that he is a poor lung transplant candidate (I do not remember that) and that his hope is being taken away  Past, Family, Social reviewed: no change  since last visit except as in HPI   REC You have mild attack of copd called COPD exacerbation  Please take levaquin500 mg once daily x 6days; avoid sunlight  Please take prednisone 40mg  once daily x 3 days, then 20mg  once daily x 3 days, then 10mg  once daily x 3 days, then 5mg  once dailyx 3 days and stop  Check alpha 1 today  REturn in 1 month with full PFT  I will touch base with [facility] Transplant team for a pre-evaluation  OV 09/23/2011 PFTS 09/23/2011 shows gold stage 3 copd. fev1 1.55L/47%, Ratio 45, TLC 7L/95%, DLCO 10/38%, 8% BD response in fev1. This is improved 250cc in fev1 compared to last visit when he was in AECOPD. Currently well and baseline. After last OV I spoke t [facility] pre-transplant evaluation MD who has requested his charts. I believe my office should have sent them these. Patient has not heard from them.  Past, Family, Social reviewed: no change since last visit  REC You have severe copd  THe lung function is improved from last visit and baseline  today at 47-50% of capacity  Continue oxygen and medications  Nurse will ensure that referral paper work was received by [facility] transplant team for their review  Return in 6 months or sooner if needed  OV 10/08/2011  Past 2 days is havig increased chest congestion, runny nose, runny eyes, cold, wheeze, cough and yellow sputum and dyspnea all c/w prior AECOPD. Rates it as moderate severity but not severe enough he needs admission. Denies edema, chest pain, syncope  Past, Family, Social reviewed: He had rt eye surgery by Dr Dione Booze and is currently on prednisone 30mg  (day 4) through 3/18 and then off, and restarts on 3/25 for surgery April on left eye on April 1st. I d/w Dr Dione Booze who says steroids based on [facility] for prevention of scleritis. Dr Dione Booze ok with pred taper through 10/22/11 for AECOPD CHEST - 2 VIEW  Comparison: Plain films of the chest 05/07/2010 and 05/06/2011. CT  chest 05/08/2010.  Findings: Pleural and parenchymal scarring in the lower right chest  is unchanged. Lungs otherwise clear. Heart size is normal. No  pneumothorax or effusion.  IMPRESSION:  No acute finding. Stable compared prior exams.  Original Report Authenticated By: Bernadene Bell. D'ALESSIO, M.D.    10/17/11- 70 yowm quit smoking around 2000 with GOLD III COPD by PFT's 09/2009. Meds through [facility]. - Dr Maple Hudson-  MR patient-seen 1 wk ago-SOB, cough-given pred and abx(completed) and still having issues; heaviness in mid-chest. Cough thick white sputum, increased wheeze, no fever, edema, palpitation, pain or blood. Last week had nebulizer treatment, Depo-Medrol, doxycycline and prednisone taper. No better now. Using his nebulizer 4 times daily. He feels he was doing very well until cataract surgery 3 weeks ago. He is now due to have his other eye done on April 1. His ophthalmologist, treating scleritis, is using prednisolone eyedrops and prednisone 30 mg daily for 2 weeks to start on March  25. Prednisone does not seem to have affected respiratory complaints. Took Lasix today. Sleeping with CPAP 16 plus oxygen all night every night. CXR today/ 10/17/11- reviewed w/ him: Findings: Pleural and parenchymal scarring in the lower right chest  is unchanged. Lungs otherwise clear. Heart size is normal. No  pneumothorax or effusion.  IMPRESSION:  No acute finding. Stable compared prior exams.  Original Report Authenticated By: Bernadene Bell. D'ALESSIO, M.D.   Sudie Grumbling 04/20/2012  Body mass index  is 31.47 kg/(m^2). - current weight 251#  COPD : Went to [facility] transplant eval. Advised to lose 25# and get into rehab program. Since then going to gyms 5 days a week; walking 2 miles  A day with oxygen and doing resistance 3 days a week. Feels healthy and accomplished and positive mood but weight not coming off. He is frustrated. Says he does "not eat much" and and "eats 6 times a day". Today, main focus is to lose weight. Insists diet is all healthy but on detailed questioning easily apparent diet is full of moderate and high glycemic foods  Overall COPD is stable. Doing 5L [place] at guym  Will have flu shot at [facility]  He has had pneumovax  No new problems since last visit. No ER visits, No UC visits, No hospitalizations. Only issue was UTI in interim; now resolved with antibiotics  #COPD  Glad is stable  continue exercises, medication, oxygen  Get flu shot at [facility]  #Weight management  we discussed extensively about weight management : your diet has too much sugar in it  - follow low glycemic diet plan that I outlined for you after extensive discussion  - For breakfast  - recommend steel cut oat meal or fiber one 60 cal (half to one cup) with low sugar 60 cal silk soymilk and some berries and one egg  - For snacks: with berries or low glycemic fruit or [employer]s mens health 35gm mixed-nut pack  - lunch and dinner - unlimited non-starchy vegetable (prefer raw fresh or roasted or grilled) with skinless  chicken or fish  - drink lot of water  - avoid all moderate and high glycemic foods  - measure weight once A week  - change your milk to SILK unsweetened soy milk 60cal  - with above plan, you might not need your diabetic drink  #Followup  2 months to report copd and weight loss     OV 05/31/2012 Body mass index is 30.37 kg/(m^2). - current weight 243#   Acute visit. On Friday 05/28/12 started feeling tired and generally unwell. On Saturday 05/29/12 chills, fever, yellow sputum, cough, chest tightness. Spent Saturday 05/29/12 and Sunday 05/30/12 in bed (other tha going to a funeral). Today 05/31/12 feeling some better but decided to come in. THere is some increase in associated wheeze. No associated chest pain, edema, orthopnea, pnd. He does not think he is sick enough to need admission  In terms of obesity: following my diet plan with low glycemic  Diet and has lost 7-8#  Review of Systems  Constitutional: Positive for chills. Negative for fever and unexpected weight change.  HENT: Negative for ear pain, nosebleeds, congestion, sore throat, rhinorrhea, sneezing, trouble swallowing, dental problem, postnasal drip and sinus pressure.   Eyes: Negative for redness and itching.  Respiratory: Positive for cough and shortness of breath. Negative for chest tightness and wheezing.   Cardiovascular: Negative for palpitations and leg swelling.  Gastrointestinal: Negative for nausea and vomiting.  Genitourinary: Negative for dysuria.  Musculoskeletal: Negative for joint swelling.  Skin: Negative for rash.  Neurological: Negative for headaches.  Hematological: Does not bruise/bleed easily.  Psychiatric/Behavioral: Negative for dysphoric mood. The patient is not nervous/anxious.    Current outpatient prescriptions:albuterol (PROAIR HFA) 108 (90 BASE) MCG/ACT inhaler, Inhale 2 puffs into the lungs every 4 (four) hours as needed. Shortness of breath, Disp: 1 Inhaler, Rfl: 6;  albuterol (PROVENTIL) (2.5  MG/3ML) 0.083% nebulizer solution, 1 vial in nebulizer every 6 hours as needed  Dx 496, Disp: 120 mL, Rfl: 1;  B-D INS SYR ULTRAFINE 1CC/30G 30G X 1/2" 1 ML MISC, , Disp: , Rfl:  Cholecalciferol (VITAMIN D3) 2000 UNITS TABS, Take 1 tablet by mouth daily. , Disp: , Rfl: ;  DEPO-TESTOSTERONE 200 MG/ML injection, Inject 200 mg into the muscle every 28 (twenty-eight) days. , Disp: , Rfl: ;  doxepin (SINEQUAN) 50 MG capsule, Take 50 mg by mouth at bedtime.  , Disp: , Rfl: ;  finasteride (PROSCAR) 5 MG tablet, Take 5 mg by mouth daily.  , Disp: , Rfl:  formoterol (FORADIL AEROLIZER) 12 MCG capsule for inhaler, Inhale contents of 1 capsule two times a day , Disp: , Rfl: ;  furosemide (LASIX) 40 MG tablet, Take 1 tablet (40 mg total) by mouth daily. As needed for weight over 252 lbs, Disp: 90 tablet, Rfl: 3;  gabapentin (NEURONTIN) 800 MG tablet, Take 800 mg by mouth 2 (two) times daily.  , Disp: , Rfl:  insulin aspart (NOVOLOG) 100 UNIT/ML injection, 10-20 Units 3 (three) times daily before meals. Sliding scale as needed with meals (14 units in the morning 12 units at lunch and 24 units at dinner), Disp: , Rfl: ;  insulin glargine (LANTUS) 100 UNIT/ML injection, Inject 80 Units into the skin at bedtime. Adjust as needed, Disp: , Rfl: ;  levothyroxine (SYNTHROID, LEVOTHROID) 25 MCG tablet, Take 25 mcg by mouth daily., Disp: , Rfl:  metFORMIN (GLUCOPHAGE) 1000 MG tablet, Take 1,000 mg by mouth 2 (two) times daily.  , Disp: , Rfl: ;  mometasone (ASMANEX 60 METERED DOSES) 220 MCG/INH inhaler, Inhale 2 puffs into the lungs daily.  , Disp: , Rfl: ;  Multiple Vitamins-Minerals (MEGA MULTIVITAMIN FOR MEN PO), Take by mouth daily.  , Disp: , Rfl: ;  naphazoline (CLEAR EYES) 0.012 % ophthalmic solution, Place 1 drop into both eyes as needed. Red eyes , Disp: , Rfl:  Nebulizers (COMPRESSOR/NEBULIZER) MISC, Use as directed Dx 496, Disp: 1 each, Rfl: 0;  PLAQUENIL 200 MG tablet, Take 1 tablet by mouth Daily., Disp: , Rfl: ;   simvastatin (ZOCOR) 20 MG tablet, Take 5 mg by mouth at bedtime. , Disp: , Rfl: ;  Tamsulosin HCl (FLOMAX) 0.4 MG CAPS, Take by mouth at bedtime.  , Disp: , Rfl: ;  tiotropium (SPIRIVA HANDIHALER) 18 MCG inhalation capsule, Place 18 mcg into inhaler and inhale daily.  , Disp: , Rfl:  traMADol (ULTRAM) 50 MG tablet, as needed., Disp: , Rfl: ;  vardenafil (LEVITRA) 20 MG tablet, Take 1 tablet (20 mg total) by mouth as needed for erectile dysfunction., Disp: 5 tablet, Rfl: 11     Objective:   Physical Exam  Nursing note and vitals reviewed. Constitutional: He is oriented to person, place, and time. He appears well-developed and well-nourished. No distress.  HENT:  Head: Normocephalic and atraumatic.  Right Ear: External ear normal.  Left Ear: External ear normal.  Mouth/Throat: Oropharynx is clear and moist. No oropharyngeal exudate.  Eyes: Conjunctivae normal and EOM are normal. Pupils are equal, round, and reactive to light. Right eye exhibits no discharge. Left eye exhibits no discharge. No scleral icterus.  Neck: Normal range of motion. Neck supple. No JVD present. No tracheal deviation present. No thyromegaly present.  Cardiovascular: Normal rate, regular rhythm and intact distal pulses.  Exam reveals no gallop and no friction rub.   No murmur heard. Pulmonary/Chest: Effort normal and breath sounds normal. No respiratory distress. He has no wheezes. He has no rales. He  exhibits no tenderness.       Generally diminished air entry - baseline No wheeze No crackles  Abdominal: Soft. Bowel sounds are normal. He exhibits no distension and no mass. There is no tenderness. There is no rebound and no guarding.       Visceral obesit with scars  Musculoskeletal: Normal range of motion. He exhibits no edema and no tenderness.  Lymphadenopathy:    He has no cervical adenopathy.  Neurological: He is alert and oriented to person, place, and time. He has normal reflexes. No cranial nerve deficit.  Coordination normal.  Skin: Skin is warm and dry. No rash noted. He is not diaphoretic. No erythema. No pallor.       Wrinkles around skin  Psychiatric: He has a normal mood and affect. His behavior is normal. Judgment and thought content normal.          Assessment & Plan:

## 2012-05-31 NOTE — Assessment & Plan Note (Signed)
#  AECOPD  take levaquin 500mg  once daily  X 5 days Please take Take prednisone 40mg  once daily x 3 days, then 30mg  once daily x 3 days, then 20mg  once daily x 3 days, then prednisone 10mg  once daily  x 3 days and stop  #COPD   - continue your oxygen and medications  - CMA will ensure refills  #Followup - 6-8 weeks or keep the end of month of appt; your choice

## 2012-06-21 ENCOUNTER — Ambulatory Visit (INDEPENDENT_AMBULATORY_CARE_PROVIDER_SITE_OTHER): Payer: Medicare Other | Admitting: Internal Medicine

## 2012-06-21 VITALS — HR 84 | Temp 98.2°F | Ht 75.0 in | Wt 250.8 lb

## 2012-06-21 DIAGNOSIS — E669 Obesity, unspecified: Secondary | ICD-10-CM

## 2012-06-21 DIAGNOSIS — J449 Chronic obstructive pulmonary disease, unspecified: Secondary | ICD-10-CM

## 2012-06-21 NOTE — Patient Instructions (Addendum)
3 months or sooner

## 2012-06-21 NOTE — Progress Notes (Signed)
Subjective:    Patient ID: [name], male    DOB: 1941-05-28, 71 y.o.   MRN: [id number]  HPI 89 yowm quit smoking around 2000 with GOLD III COPD by PFT's 09/2009. Meds through [facility]  June 18, 2010: EX 100 pack year smoker. Home O2 dependent Gold stage 3 COPD with low DLCO 35%  in PFTs MArch 2011. BAeline pulse ox is 87-93% on 2L [place]. In,  OCt 2011 Ct showing some UIP component but predmoninant COPD on CT. 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 Sherene Sires to myself.  In Oct 2011 had some mild AECOPD related hemoptysis with CT showing only UIP but not showing any mass. Since then hemoptysis resolved. Currently feels baseline on 2-3L [place]. Claudication lmits him more than dyspnea at 1/4 th mile. Denies chronic cough but on mucinex and feels it helps. On spiriva, foradil and asmanex through [facility]. Has not done rehab though wife is attending rehab. Denies chest pains, edema, wheezing, fever, colored sputum. REC: CHECK AUTOIMMUNE PROFILE,  START NAC, GET FULL PFTs   August 12, 2010: Followup. in interim was admitted for sBP12/19/2011 due to [facility] from prior laparotomies. Resolved with conservative Rx. Started pulmnary rehab 2 weeks ago. Attend 4 sesssions. It is helping so far and he likes it. No new complaints. Dyspnea is stable class 2-3. Denies associated cough, chest pain, hemoptysis, edema, sputum, weight loss. Had autoimmune profile 07/03/2010 - CRP 10, RF 352 but otherwise negative. Upon revealing labs he states he is known to have RA diagnosed over 10 years ago and was on enbrel. He is due to see a rheumatologist at [place], [place]. PFTs today continue to show Gold stage 3 COPD and somewhat improved from a year ago.  August 27, 2010 --Presents for an acute office visit.Complains of increased sob,  dry cough, wheezing. Pt states he doesn't feel like he is getting enough oxygen -cant get enough air. Retaining more fluid.  Denies chest pain, orthopnea, hemoptysis, fever, n/v/d,  headache,recent travel. RX: DOXY AND PRED   OV 02/21/2011: Admitted 7/22 through 7/26 for Acute-on-chronic hypoxemia, probably with chronic obstructive       pulmonary disease exacerbation, pneumonia, acute congestive heart      Failure, (presented with dyspnea, edema, cough). est x-ray on July 22 yield chronic lung changes without acute      overlying pulmonary process.  A 2-D echo yields an ejection fraction of 55% to 60% with grade 2      diastolic dysfunction.  Mitral valve with moderately calcified      Annulus. ( I personally reviewed thos results)  Moderately thickened leaflets. RX: DIURESIS, PRED, AVELOX and O2   Today  Followup from above hospitalization. States he no longer has worsening dyspnea, edema, sputum, cough, fever, chills. Back to baseline o2 need of 3L [place]. However, feels fatigued today more than yesterday. Feels is due to benadryl which he is taking for insomnia. Was talking Remus Loffler but he was falling as a result and stopped. He has class 3 dyspnea at baseline. Has completed rehab.  Compliant with meds>>no changes  03/20/11  Acute OV  Complains of persistent leg swelling. Increased DOE with actiivity . Wears out easily. Lasix has been increased to 40mg  daily with limitedresponse. Weight trending up , now up 10 lbs over last month. Does have cards consult next week. Previous echo last month during hospital admit showed LV nml w/ grade 2 diastolic dysfnx. No chest pain ,  increaesed cough or congestion  >>Aldactone 25mg  Twice daily  rx   04/29/2011 Follow up  Pt returns for follow up. Last ov with increased leg swelling despite lasix 40mg . Aldactone 25mg  Twice daily was added to his regimen. He says this did help some however was taken off per cardiology recommendations. He says leg swelling is about the same.  Has gotten some TEDS stocking to wear daily .   Does complain of increased congestion for last few days with green mucus.  Wife was recently tx for bronchitis. NO fever or hemoptysis. No recent abx use. Taking spiriva , foradil and asmanex without known difficulty-gets meds via [place] system.  >>STOP ALDACTONE per DR Nelda Severe  05/09/2011 Post hospital  Pt returns for close follow up from hospitalization. Admitted 10/7-10/11 for acute on chronic resp failure w/ ILD flare, CHF w/ vol overload, and possible CAP along w/ supsected OSA /hypercarbia.  He was tx w/ initial BIPAP w/ transition to nocturnal BIPAP for suspected OSA w/  Associated hypercarbia. Along w/ pulmonary hygiene, IV abx and steroids. HE was aggressively  Diuresised w/ improvement in vol overload. Today bmet  shows improved renal insufficiency .  XRAy showed increased consolidation predominately in bases.  He returns today improved w/ weight loss of >10lbs w/ substantial decreased in edema.  Feels his breathing is back to his baseline.  No wheezing or hemoptysis. No chest pain or fever.  >>pred taper , lasix 40mg  , sleep study and referral to [facility]  OV 05/22/11: Followup for above. Recent AECOPD. Suspected OSA; sent hom on bipap/cpap which he says is helping sleep quality and wakefulness immensely. Thinks sleep study is abnromal. He is due to see Dr Vassie Loll soon for it. Denies resp discoomfort. Still bothered by cosmetic effects of edema. He is frustrated that despite lasix and fluid restriction it still persists (has varicose veins). Advised him to see what happens with OSA Rx. He also wants to know how to prevent AECOPD >>  06/11/11 Acute OV  Complains of increased SOB, prod cough with clear mucus, body aches, weight increase of 4# x2days. Has increaesed edema and weight is up 4 lbs. He is more dyspneic with activity. We reviewed his meds . He did eat quite a bit of salty food yesterday. We discussed low salt diet.  Wants to catch his breathing before it gets too bad. No discolored mucus or fever.   REC Increase Lasix 40mg  1 extra tablet for next 2 days  Low  salt diet  Legs elevated.  Follow up Dr. Vassie Loll And Marchelle Gearing As planned  Please contact office for sooner follow up if symptoms do not improve or worsen or seek emergency care   OV 08/27/2011 Called in for AECOPD RX 07/11/11. Saw Dr Vassie Loll for sleep 08/06/11 and felt he was doing well on CPAP 16. Today reports 3 days increased dyspnea; was walking around the block with his 3L o2 but now able to hardly do mailbox and back. Slight associated increase in wheeze and sputum volume but no change in sputum color or cough. Feels like he is in AECOPD Denies fever, chest pain, or edema worsening. Spirometry today: Fev1 1.3L/30% predicted, ratio 48. He is interested in lung transplant. Says a friend who is diabetic got transplanted for COPD at [facility] and is doing well. Says he recollects me telling him that he is a poor lung transplant candidate (I do not remember that) and that his hope is being taken away  Past, Family, Social reviewed: no change  since last visit except as in HPI   REC You have mild attack of copd called COPD exacerbation  Please take levaquin500 mg once daily x 6days; avoid sunlight  Please take prednisone 40mg  once daily x 3 days, then 20mg  once daily x 3 days, then 10mg  once daily x 3 days, then 5mg  once dailyx 3 days and stop  Check alpha 1 today  REturn in 1 month with full PFT  I will touch base with [facility] Transplant team for a pre-evaluation  OV 09/23/2011 PFTS 09/23/2011 shows gold stage 3 copd. fev1 1.55L/47%, Ratio 45, TLC 7L/95%, DLCO 10/38%, 8% BD response in fev1. This is improved 250cc in fev1 compared to last visit when he was in AECOPD. Currently well and baseline. After last OV I spoke t [facility] pre-transplant evaluation MD who has requested his charts. I believe my office should have sent them these. Patient has not heard from them.  Past, Family, Social reviewed: no change since last visit  REC You have severe copd  THe lung function is improved from last visit and baseline  today at 47-50% of capacity  Continue oxygen and medications  Nurse will ensure that referral paper work was received by [facility] transplant team for their review  Return in 6 months or sooner if needed  OV 10/08/2011  Past 2 days is havig increased chest congestion, runny nose, runny eyes, cold, wheeze, cough and yellow sputum and dyspnea all c/w prior AECOPD. Rates it as moderate severity but not severe enough he needs admission. Denies edema, chest pain, syncope  Past, Family, Social reviewed: He had rt eye surgery by Dr Dione Booze and is currently on prednisone 30mg  (day 4) through 3/18 and then off, and restarts on 3/25 for surgery April on left eye on April 1st. I d/w Dr Dione Booze who says steroids based on Dr Group of [facility] for prevention of scleritis. Dr Dione Booze ok with pred taper through 10/22/11 for AECOPD CHEST - 2 VIEW  Comparison: Plain films of the chest 05/07/2010 and 05/06/2011. CT  chest 05/08/2010.  Findings: Pleural and parenchymal scarring in the lower right chest  is unchanged. Lungs otherwise clear. Heart size is normal. No  pneumothorax or effusion.  IMPRESSION:  No acute finding. Stable compared prior exams.  Original Report Authenticated By: Bernadene Bell. D'ALESSIO, M.D.    10/17/11- 70 yowm quit smoking around 2000 with GOLD III COPD by PFT's 09/2009. Meds through [facility] Acute VISIT. - Dr Maple Hudson-  MR patient-seen 1 wk ago-SOB, cough-given pred and abx(completed) and still having issues; heaviness in mid-chest. Cough thick white sputum, increased wheeze, no fever, edema, palpitation, pain or blood. Last week had nebulizer treatment, Depo-Medrol, doxycycline and prednisone taper. No better now. Using his nebulizer 4 times daily. He feels he was doing very well until cataract surgery 3 weeks ago. He is now due to have his other eye done on April 1. His ophthalmologist, treating scleritis, is using prednisolone eyedrops and prednisone 30 mg daily for 2 weeks to start on March  25. Prednisone does not seem to have affected respiratory complaints. Took Lasix today. Sleeping with CPAP 16 plus oxygen all night every night. CXR today/ 10/17/11- reviewed w/ him: Findings: Pleural and parenchymal scarring in the lower right chest  is unchanged. Lungs otherwise clear. Heart size is normal. No  pneumothorax or effusion.  IMPRESSION:  No acute finding. Stable compared prior exams.  Original Report Authenticated By: Bernadene Bell. D'ALESSIO, M.D.   Sudie Grumbling 04/20/2012  Body mass index  is 31.47 kg/(m^2). - current weight 251#  COPD : Went to [facility] transplant eval. Advised to lose 25# and get into rehab program. Since then going to gyms 5 days a week; walking 2 miles  A day with oxygen and doing resistance 3 days a week. Feels healthy and accomplished and positive mood but weight not coming off. He is frustrated. Says he does "not eat much" and and "eats 6 times a day". Today, main focus is to lose weight. Insists diet is all healthy but on detailed questioning easily apparent diet is full of moderate and high glycemic foods  Overall COPD is stable. Doing 5L [place] at guym  Will have flu shot at [facility]  He has had pneumovax  No new problems since last visit. No ER visits, No UC visits, No hospitalizations. Only issue was UTI in interim; now resolved with antibiotics  #COPD  Glad is stable  continue exercises, medication, oxygen  Get flu shot at [facility]  #Weight management  we discussed extensively about weight management : your diet has too much sugar in it  - follow low glycemic diet plan that I outlined for you after extensive discussion  - For breakfast  - recommend steel cut oat meal or fiber one 60 cal (half to one cup) with low sugar 60 cal silk soymilk and some berries and one egg  - For snacks: with berries or low glycemic fruit or [employer] mens health 35gm mixed-nut pack  - lunch and dinner - unlimited non-starchy vegetable (prefer raw fresh or roasted or grilled) with skinless  chicken or fish  - drink lot of water  - avoid all moderate and high glycemic foods  - measure weight once A week  - change your milk to SILK unsweetened soy milk 60cal  - with above plan, you might not need your diabetic drink  #Followup  2 months to report copd and weight loss     OV 05/31/2012 - ACUTE VISIT Body mass index is 30.37 kg/(m^2). - current weight 243#   Acute visit. On Friday 05/28/12 started feeling tired and generally unwell. On Saturday 05/29/12 chills, fever, yellow sputum, cough, chest tightness. Spent Saturday 05/29/12 and Sunday 05/30/12 in bed (other tha going to a funeral). Today 05/31/12 feeling some better but decided to come in. THere is some increase in associated wheeze. No associated chest pain, edema, orthopnea, pnd. He does not think he is sick enough to need admission  In terms of obesity: following my diet plan with low glycemic  Diet and has lost 7-8#   COPD exacerbation - Katelin Kutsch, MD 05/31/2012 5:09 PM Signed  #AECOPD  take levaquin 500mg  once daily X 5 days  Please take Take prednisone 40mg  once daily x 3 days, then 30mg  once daily x 3 days, then 20mg  once daily x 3 days, then prednisone 10mg  once daily x 3 days and stop  #COPD  - continue your oxygen and medications  - CMA will ensure refills   REC #Followup  - 6-8 weeks or keep the end of month of appt; your choice  #Weight and exercise mgmt  - continue the good work  - you have lost significant weight  OV 06/21/2012 FU  WEight mGMT nand copd  #OBesity Estimated Body mass index is 31.35 kg/(m^2)   Weight as of this encounter: 250 lb 12.8 oz(113.762 kg). Waist 47 inces  - he says at home today weight 241#. Says our scale is wrong. He is happy he is losing "weight"  but  frustrated no "waist" loss. He is following low glyucemic diet but not loggin in caloric intake #COPD  COPD   - stable. Went to [facility]. Advised to use 8L with exercise.      Review of Systems  Constitutional:  Negative for fever and unexpected weight change.  HENT: Negative for ear pain, nosebleeds, congestion, sore throat, rhinorrhea, sneezing, trouble swallowing, dental problem, postnasal drip and sinus pressure.   Eyes: Negative for redness and itching.  Respiratory: Negative for cough, chest tightness, shortness of breath and wheezing.   Cardiovascular: Negative for palpitations and leg swelling.  Gastrointestinal: Negative for nausea and vomiting.  Genitourinary: Negative for dysuria.  Musculoskeletal: Negative for joint swelling.  Skin: Negative for rash.  Neurological: Negative for headaches.  Hematological: Does not bruise/bleed easily.  Psychiatric/Behavioral: Negative for dysphoric mood. The patient is not nervous/anxious.        Objective:   Physical Exam Nursing note and vitals reviewed. Constitutional: He is oriented to person, place, and time. He appears well-developed and well-nourished. No distress.  HENT:  Head: Normocephalic and atraumatic.  Right Ear: External ear normal.  Left Ear: External ear normal.  Mouth/Throat: Oropharynx is clear and moist. No oropharyngeal exudate.  Eyes: Conjunctivae normal and EOM are normal. Pupils are equal, round, and reactive to light. Right eye exhibits no discharge. Left eye exhibits no discharge. No scleral icterus.  Neck: Normal range of motion. Neck supple. No JVD present. No tracheal deviation present. No thyromegaly present.  Cardiovascular: Normal rate, regular rhythm and intact distal pulses.  Exam reveals no gallop and no friction rub.   No murmur heard. Pulmonary/Chest: Effort normal and breath sounds normal. No respiratory distress. He has no wheezes. He has no rales. He exhibits no tenderness.       Generally diminished air entry - baseline No wheeze No crackles  Abdominal: Soft. Bowel sounds are normal. He exhibits no distension and no mass. There is no tenderness. There is no rebound and no guarding.       Visceral obesit  with scars  - 47 inches at navel Musculoskeletal: Normal range of motion. He exhibits no edema and no tenderness.  Lymphadenopathy:    He has no cervical adenopathy.  Neurological: He is alert and oriented to person, place, and time. He has normal reflexes. No cranial nerve deficit. Coordination normal.  Skin: Skin is warm and dry. No rash noted. He is not diaphoretic. No erythema. No pallor.       Wrinkles around skin  Psychiatric: He has a normal mood and affect. His behavior is normal. Judgment and thought content normal.         Assessment & Plan:

## 2012-06-26 NOTE — Assessment & Plan Note (Signed)
#  COPD  - stable  - continue medications

## 2012-06-26 NOTE — Assessment & Plan Note (Signed)
#  Weight mgmt   - congratulations on progress so far  - keep in mind that for every 6# weight loss, waist will go down 1 inch only  - focus on process of nutrition and exercise, weight loss will follow automatically. Do not get frustrated. Keep up good work  - To raise the notch further  - register in [email or web address] and set 2# weight loss goal for the month and log in all you eat but make sure you do not exceed the caloric limit  -do high intensity interval training but make sure pulse does not go below 88% and heart rate above 140-150; stop if you have chest pain or extreme shortness of breath   - high intensity interval training is 5 min warm up and then alternate fast and slow pace (30sec fast alt 30 sec slow or 1 min fast alt 1-4 min slow or 30 sec fast and 1 min slow) for 20 minutes and then 5 minutes cool down   - do this training 3 times a week  - continue weights  > 50% of this 15 min visit spent in face to face counseling   #Followup

## 2012-08-31 ENCOUNTER — Encounter: Payer: Self-pay | Admitting: Pulmonary Disease

## 2012-08-31 ENCOUNTER — Ambulatory Visit (INDEPENDENT_AMBULATORY_CARE_PROVIDER_SITE_OTHER): Payer: Medicare Other | Admitting: Pulmonary Disease

## 2012-08-31 VITALS — BP 124/70 | HR 58 | Temp 98.1°F | Ht 75.0 in | Wt 244.0 lb

## 2012-08-31 DIAGNOSIS — G4733 Obstructive sleep apnea (adult) (pediatric): Secondary | ICD-10-CM

## 2012-08-31 DIAGNOSIS — Z9989 Dependence on other enabling machines and devices: Secondary | ICD-10-CM

## 2012-08-31 NOTE — Patient Instructions (Addendum)
STay on CPAP 14 cm with 3L oxygen

## 2012-08-31 NOTE — Progress Notes (Signed)
  Subjective:    Patient ID: [name], male    DOB: [birth year], 72 y.o.   MRN: [id number]  HPI  88 yowm quit smoking around 2000 with GOLD III COPD on 3-4 L o2. Meds through [facility]  He was hospitalised in 10/12 for acute on chronic hypercarbic resp failure. He also has gr 2 diastolic dysfunction & improved with diuresis . He was started on VPAP S auto on discharge [facility])  PSG 10/15 showed severe OSA, AHI 48/h with nadir desatn 78% correctd by CPAP 12 cm, 3 L O2 blended in , c flex +2 cm, humidity, A medium full face mask was used.  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 Desaturations persisted without resp events on CPAP 12 cm   Download 11/19-12/11/12 shows good control of events on CPAP 16 cm, good usage   08/31/2012  77m FU  Underwent Lung transplant eval - he finally decided to come off the list CPAP was at 12 cm but increased to 16 cm at his request in the past 7/13 >> Dropped pressure to 14 cm , Increase C flex to +3 On 3 L at rest  & sleep, pressure ok, mask OK Neurontin bid - around 7pm Monitor wts daily & takes lasix for increase in 2lbs or more  Breathing is unchanged. Still using CPAP every night for 8-9 hours. Denies chest pain, chest tightness, SOB or coughing. On 8L O2 with exercise     Review of Systems neg for any significant sore throat, dysphagia, itching, sneezing, nasal congestion or excess/ purulent secretions, fever, chills, sweats, unintended wt loss, pleuritic or exertional cp, hempoptysis, orthopnea pnd or change in chronic leg swelling. Also denies presyncope, palpitations, heartburn, abdominal pain, nausea, vomiting, diarrhea or change in bowel or urinary habits, dysuria,hematuria, rash, arthralgias, visual complaints, headache, numbness weakness or ataxia.     Objective:   Physical Exam  Gen. Pleasant, well-nourished, in no distress ENT - no lesions, no post nasal drip Neck: No JVD, no  thyromegaly, no carotid bruits Lungs: no use of accessory muscles, no dullness to percussion, decreased without rales or rhonchi  Cardiovascular: Rhythm regular, heart sounds  normal, no murmurs or gallops, no peripheral edema Musculoskeletal: No deformities, no cyanosis or clubbing         Assessment & Plan:

## 2012-09-01 NOTE — Assessment & Plan Note (Signed)
Ct CPAP at 14 cm  This seems to correct events adequately & is well tolerated Weight loss encouraged, compliance with goal of at least 4-6 hrs every night is the expectation. Advised against medications with sedative side effects Cautioned against driving when sleepy - understanding that sleepiness will vary on a day to day basis  I doubt need for bipap here

## 2012-09-10 ENCOUNTER — Ambulatory Visit: Payer: Medicare Other | Admitting: Internal Medicine

## 2012-09-16 ENCOUNTER — Ambulatory Visit (INDEPENDENT_AMBULATORY_CARE_PROVIDER_SITE_OTHER): Payer: 59 | Admitting: Internal Medicine

## 2012-09-16 ENCOUNTER — Encounter: Payer: Self-pay | Admitting: Internal Medicine

## 2012-09-16 VITALS — BP 120/64 | HR 56 | Temp 97.8°F | Ht 75.0 in | Wt 249.0 lb

## 2012-09-16 DIAGNOSIS — Z0000 Encounter for general adult medical examination without abnormal findings: Secondary | ICD-10-CM

## 2012-09-16 NOTE — Assessment & Plan Note (Signed)

## 2012-09-16 NOTE — Patient Instructions (Signed)
Please continue all other medications as before, and refills have been done if requested. Please keep your appointments with your specialists as you have planned - Dr Rudi Coco and your [facility] physicians You are otherwise up to date with prevention measures today. Please continue your efforts at being more active, low cholesterol diet, and weight control. Thank you for enrolling in MyChart. Please follow the instructions below to securely access your online medical record. MyChart allows you to send messages to your doctor, view your test results, renew your prescriptions, schedule appointments, and more. To Log into My Chart online, please go by [employer] or [employer] to [email or web address], or download the MyChart App from the [employer] .  Your Username is: [name]  (pass [email or web address]) Please return in 6 months, or sooner if needed

## 2012-09-16 NOTE — Progress Notes (Signed)
Subjective:    Patient ID: [name], male    DOB: 08-Oct-1940, 72 y.o.   MRN: [id number]  HPI  Here for wellness and f/u;  Overall doing ok;  Pt denies CP, worsening SOB, DOE, wheezing, orthopnea, PND, worsening LE edema, palpitations, dizziness or syncope.  Pt denies neurological change such as new headache, facial or extremity weakness.  Pt denies polydipsia, polyuria, or low sugar symptoms. Pt states overall good compliance with treatment and medications, good tolerability, and has been trying to follow lower cholesterol diet.  Pt denies worsening depressive symptoms, suicidal ideation or panic. No fever, night sweats, wt loss, loss of appetite, or other constitutional symptoms.  Pt states good ability with ADL's, has low fall risk, home safety reviewed and adequate, no other significant changes in hearing or vision, and only occasionally active with exercise.  Pt has opted out of the eval for [facility] lung transplant.  Did see psychiatry/psychology since last seen July 2013 but felt he did not to go back.  Most recent labs done at [place], he recalls an a1c of 7.6, does not want further labs today.  To see rheumatyology at [facility] later today, first visit, needs better RA control as it is affecting his right eye and no longer trusts the rheum at the [place], has just seen Dr Christeen Douglas at [facility] last wk Past Medical History  Diagnosis Date  . THRUSH 11/06/2009  . HYPOTHYROIDISM 07/30/2009  . DIABETES MELLITUS, TYPE II 11/23/2009  . DEPRESSION 11/23/2009  . 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 HYPERTENSION 07/30/2009  . CORONARY ARTERY DISEASE 11/23/2009  . CHRONIC OBSTRUCTIVE PULMONARY DISEASE, ACUTE EXACERBATION 03/01/2010  . C O P D 07/30/2009  . 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 RESPIRATORY FAILURE, CHRONIC 07/31/2009  . BENIGN PROSTATIC HYPERTROPHY 11/23/2009  . DEGENERATIVE JOINT DISEASE 11/23/2009  . 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 FATIGUE 11/23/2009  . 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 GAIT DISTURBANCE  12/10/2009  . DYSPNEA/SHORTNESS OF BREATH 12/08/2009  . HEMOPTYSIS UNSPECIFIED 05/07/2010  . RA (rheumatoid arthritis) 06/11/2011  . PTSD (post-traumatic stress disorder) 03/10/2012   Past Surgical History  Procedure Laterality Date  . Small bowel obstruction repair with adhesiolysis    . Hx of remote ileum resection due to bleeding    . Abdominal aortic aneurysm repair    . S/p back surgury   1972    lumbar laminectomy Dr. Fannie Knee  . S/p left femoral embolectomy with left leg ischemia      Dr. Hart Rochester, vascular  . S/p right ganglion cyst      Dr. Teressa Senter    reports that he quit smoking about 14 years ago. His smoking use included Cigarettes, Pipe, and Cigars. He has a 100 pack-year smoking history. He has never used smokeless tobacco. He reports that he does not drink alcohol or use illicit drugs. family history is not on file. No Known Allergies Current Outpatient Prescriptions on File Prior to Visit  Medication Sig Dispense Refill  . albuterol (PROAIR HFA) 108 (90 BASE) MCG/ACT inhaler Inhale 2 puffs into the lungs every 4 (four) hours as needed. Shortness of breath  1 Inhaler  6  . albuterol (PROVENTIL) (2.5 MG/3ML) 0.083% nebulizer solution 1 vial in nebulizer every 6 hours as needed Dx 496  120 mL  6  . B-D INS SYR ULTRAFINE 1CC/30G 30G X 1/2" 1 ML MISC       . Cholecalciferol (VITAMIN D3) 2000 UNITS TABS Take 1 tablet by mouth daily.       [name]  DEPO-TESTOSTERONE 200 MG/ML injection Inject 200 mg into the muscle every 28 (twenty-eight) days.       [name] doxepin (SINEQUAN) 50 MG capsule Take 50 mg by mouth at bedtime.        . finasteride (PROSCAR) 5 MG tablet Take 5 mg by mouth daily.        . formoterol (FORADIL AEROLIZER) 12 MCG capsule for inhaler Inhale contents of 1 capsule two times a day       . furosemide (LASIX) 40 MG tablet Take 1 tablet (40 mg total) by mouth daily. As needed for weight over 252 lbs  90 tablet  3  . gabapentin (NEURONTIN) 800 MG tablet Take 800 mg by mouth 2 (two)  times daily.        . insulin aspart (NOVOLOG) 100 UNIT/ML injection 10-20 Units 3 (three) times daily before meals. Sliding scale as needed with meals (14 units in the morning 12 units at lunch and 24 units at dinner)      . insulin glargine (LANTUS) 100 UNIT/ML injection Inject 65 Units into the skin at bedtime. Adjust as needed      . levothyroxine (SYNTHROID, LEVOTHROID) 25 MCG tablet Take 25 mcg by mouth daily.      . metFORMIN (GLUCOPHAGE) 1000 MG tablet Take 1,000 mg by mouth 2 (two) times daily.        . mometasone (ASMANEX 60 METERED DOSES) 220 MCG/INH inhaler Inhale 2 puffs into the lungs daily.        . Multiple Vitamins-Minerals (MEGA MULTIVITAMIN FOR MEN PO) Take by mouth daily.        . naphazoline (CLEAR EYES) 0.012 % ophthalmic solution Place 1 drop into both eyes as needed. Red eyes       . Nebulizers (COMPRESSOR/NEBULIZER) MISC Use as directed Dx 496  1 each  0  . PLAQUENIL 200 MG tablet Take 1 tablet by mouth Daily.      . simvastatin (ZOCOR) 20 MG tablet Take 5 mg by mouth at bedtime.       . Tamsulosin HCl (FLOMAX) 0.4 MG CAPS Take by mouth at bedtime.        [name] tiotropium (SPIRIVA HANDIHALER) 18 MCG inhalation capsule Place 18 mcg into inhaler and inhale daily.        . traMADol (ULTRAM) 50 MG tablet as needed.      . vardenafil (LEVITRA) 20 MG tablet Take 1 tablet (20 mg total) by mouth as needed for erectile dysfunction.  5 tablet  11   No current facility-administered medications on file prior to visit.    Review of Systems Constitutional: Negative for diaphoresis, activity change, appetite change or unexpected weight change.  HENT: Negative for hearing loss, ear pain, facial swelling, mouth sores and neck stiffness.   Eyes: Negative for pain, redness and visual disturbance.  Respiratory: Negative for worsening recent shortness of breath and wheezing.   Cardiovascular: Negative for chest pain and palpitations.  Gastrointestinal: Negative for diarrhea, blood in stool,  abdominal distention or other pain Genitourinary: Negative for hematuria, flank pain or change in urine volume.  Musculoskeletal: Negative for myalgias and joint swelling.  Skin: Negative for color change and wound.  Neurological: Negative for syncope and numbness. other than noted Hematological: Negative for adenopathy.  Psychiatric/Behavioral: Negative for hallucinations, self-injury, decreased concentration and agitation.      Objective:   Physical Exam BP 120/64  Pulse 56  Temp(Src) 97.8 F (36.6 C) (Oral)  Ht 6\' 3"  (1.905 m)  Wt 249 lb (112.946 kg)  BMI 31.12 kg/m2  SpO2 92% on Home o2 at 2L at rest, 3-4L with exertion VS noted,  Constitutional: Pt is oriented to person, place, and time. Appears well-developed and well-nourished.  Head: Normocephalic and atraumatic.  Right Ear: External ear normal.  Left Ear: External ear normal.  Nose: Nose normal.  Mouth/Throat: Oropharynx is clear and moist.  Eyes: Conjunctivae and EOM are normal. Pupils are equal, round, and reactive to light.  Neck: Normal range of motion. Neck supple. No JVD present. No tracheal deviation present.  Cardiovascular: Normal rate, regular rhythm, normal heart sounds and intact distal pulses.   Pulmonary/Chest: Effort normal and breath sounds decreased. - no rales  Abdominal: Soft. Bowel sounds are normal. There is no tenderness. No HSM  Musculoskeletal: Normal range of motion. Exhibits trace edeam bilat Lymphadenopathy:  Has no cervical adenopathy.  Neurological: Pt is alert and oriented to person, place, and time. Pt has normal reflexes. No cranial nerve deficit.  Skin: Skin is warm and dry. No rash noted.  Psychiatric:  Has  normal mood and affect. Behavior is normal.     Assessment & Plan:

## 2012-09-27 ENCOUNTER — Encounter: Payer: Self-pay | Admitting: Internal Medicine

## 2012-09-27 ENCOUNTER — Ambulatory Visit (INDEPENDENT_AMBULATORY_CARE_PROVIDER_SITE_OTHER): Payer: 59 | Admitting: Internal Medicine

## 2012-09-27 VITALS — BP 140/80 | HR 74 | Temp 98.5°F | Ht 75.0 in | Wt 251.4 lb

## 2012-09-27 NOTE — Patient Instructions (Addendum)
##   Oral thrush - For Oral thrush: Take Suspension (swish and swallow): 500,000 units 4 times/day for 5 days; swish in the mouth and retain for as long as possible (several minutes) before swallowing - to prevent this, rinse your mouth after inhalers with mouthwash daily  #COPD  - stable clinically - continue oxygen and your inhalers - Start N. acetylcysteine 600 mg twice a day  - Congo study published in Lancet British Journal in 2014 shows it helps   - this is not to make you feel better but to to prevent recurrent bronchitis episodes  - You are more likely to get this drug at Hallandale Outpatient Surgical Centerltd or a vitamin store then at Bank of America or Walgreens or target  - You can also get this at website ConstitutionJournal.co.uk  - It functions as an anti-oxidant and there are minimal/none side effects  #Followup  6 months or sooner

## 2012-09-27 NOTE — Progress Notes (Signed)
Subjective:    Patient ID: [name], male    DOB: Jul 24, 1941, 72 y.o.   MRN: [id number]  HPI    63 yowm quit smoking around 2000 with GOLD III COPD on 3-4 L o2. Meds through [facility]  He was hospitalised in 10/12 for acute on chronic hypercarbic resp failure. He also has gr 2 diastolic dysfunction & improved with diuresis . He was started on VPAP S auto on discharge [facility])  PSG 10/15 showed severe OSA, AHI 48/h with nadir desatn 78% correctd by CPAP 12 cm, 3 L O2 blended in , c flex +2 cm, humidity, A medium full face mask was used.  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 Desaturations persisted without resp events on CPAP 12 cm   Download 11/19-12/11/12 shows good control of events on CPAP 16 cm, good usage   08/31/2012  68m FU  Underwent Lung transplant eval - he finally decided to come off the list CPAP was at 12 cm but increased to 16 cm at his request in the past 7/13 >> Dropped pressure to 14 cm , Increase C flex to +3 On 3 L at rest  & sleep, pressure ok, mask OK Neurontin bid - around 7pm Monitor wts daily & takes lasix for increase in 2lbs or more  Breathing is unchanged. Still using CPAP every night for 8-9 hours. Denies chest pain, chest tightness, SOB or coughing. On 8L O2 with exercise   OV 09/27/2012 This is returns for routine followup of COPD with some underlying pulmonary fibrosis but dominant component being COPD. Next issue is, obesity  In terms of obesity he has not lost any weightIN terms of weight: struggling with diet due to lack of participation from wife. HE says he will go back to diet and gym.   Estimated body mass index is 31.42 kg/(m^2) as calculated from the following:   Height as of this encounter: 6\' 3"  (1.905 m).   Weight as of this encounter: 251 lb 6.4 oz (114.034 kg).  In terms of COPD, Came off ttransplant list because he has copd more dominant than pulmonary fibrosis. They  have advised him that fev1 needs to drop < 30% for transplant. This was too much for him and all social logisitics and with age and with wife with copd and prognosis of transplant. So, he had family discussion and decided against transplant.   In terms of copd, he feels he is doing real well and feels is stable.    Past, Family, Social reviewed: wife current admitted with AECOPD Gideon. He is upset that PCCM has not been consulted by [facility]ad  Review of Systems  Constitutional: Positive for unexpected weight change. Negative for fever.  HENT: Negative for ear pain, nosebleeds, congestion, sore throat, rhinorrhea, sneezing, trouble swallowing, dental problem, postnasal drip and sinus pressure.   Eyes: Negative for redness and itching.  Respiratory: Positive for shortness of breath. Negative for cough, chest tightness and wheezing.   Cardiovascular: Negative for palpitations and leg swelling.  Gastrointestinal: Negative for nausea and vomiting.  Genitourinary: Negative for dysuria.  Musculoskeletal: Negative for joint swelling.  Skin: Negative for rash.  Neurological: Negative for headaches.  Hematological: Does not bruise/bleed easily.  Psychiatric/Behavioral: Positive for dysphoric mood. The patient is not nervous/anxious.        Objective:   Physical Exam  Nursing note and vitals reviewed. Constitutional: He is oriented to person, place, and time. He appears  well-developed and well-nourished. No distress.  Body mass index is 31.42 kg/(m^2).   HENT:  Head: Normocephalic and atraumatic.  Right Ear: External ear normal.  Left Ear: External ear normal.  Mouth/Throat: Oropharynx is clear and moist. No oropharyngeal exudate.  Thrush +  Eyes: Conjunctivae and EOM are normal. Pupils are equal, round, and reactive to light. Right eye exhibits no discharge. Left eye exhibits no discharge. No scleral icterus.  Neck: Normal range of motion. Neck supple. No JVD present. No tracheal  deviation present. No thyromegaly present.  Cardiovascular: Normal rate, regular rhythm and intact distal pulses.  Exam reveals no gallop and no friction rub.   No murmur heard. Pulmonary/Chest: Effort normal and breath sounds normal. No respiratory distress. He has no wheezes. He has no rales. He exhibits no tenderness.  Purse lip breathing + Oxygen on   Abdominal: Soft. Bowel sounds are normal. He exhibits no distension and no mass. There is no tenderness. There is no rebound and no guarding.  Musculoskeletal: Normal range of motion. He exhibits no edema and no tenderness.  Lymphadenopathy:    He has no cervical adenopathy.  Neurological: He is alert and oriented to person, place, and time. He has normal reflexes. No cranial nerve deficit. Coordination normal.  Skin: Skin is warm and dry. No rash noted. He is not diaphoretic. No erythema. No pallor.  Psychiatric: He has a normal mood and affect. His behavior is normal. Judgment and thought content normal.          Assessment & Plan:

## 2012-09-27 NOTE — Assessment & Plan Note (Signed)
Stable disease  Plan Continue oxygen, nebulizers, exercise therapy Advice N. acetylcysteine to prevent recurrent exacerbations

## 2012-11-08 ENCOUNTER — Telehealth: Payer: Self-pay | Admitting: Internal Medicine

## 2012-11-08 MED ORDER — AZITHROMYCIN 250 MG PO TABS: ORAL_TABLET | ORAL | Status: DC

## 2012-11-08 MED ORDER — PREDNISONE 10 MG PO TABS: ORAL_TABLET | ORAL | Status: DC

## 2012-11-08 NOTE — Telephone Encounter (Signed)
I spoke with pt and is aware of MW resc. RX has been sent. Will also forward note to MR as an FYI.

## 2012-11-08 NOTE — Telephone Encounter (Signed)
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

## 2012-11-08 NOTE — Telephone Encounter (Signed)
Last seen 09-27-12. MR COPD pt. Pt is c/o chest congestion, productive cough with yellow phlegm, increased SOB, chest heaviness, and ankle swelling x 3 days. Pt states he has taken his fluid pill and that this has helped with the swelling. He states he wife was recently sick and he feels like he got "run down" while taking care of her. Pt started taking N. Acetylcysteine 600mg  twice daily about 2 weeks ago.  Pt refused appt stating he just came on 3-3-124 and does not want another copay. Please advise. Carron Curie, CMA No Known Allergies

## 2012-11-14 NOTE — Telephone Encounter (Signed)
i will address this at fu  Dr. Kalman Shan, M.D., [facility]P Pulmonary and Critical Care Medicine Staff Physician [place] [facility] [facility] [place] [facility] Critical Care Pager: [phone], If no answer or between  15:00h - 7:00h: call [phone]  11/14/2012 9:21 PM

## 2012-12-01 DIAGNOSIS — H15003 Unspecified scleritis, bilateral: Secondary | ICD-10-CM | POA: Insufficient documentation

## 2012-12-01 DIAGNOSIS — H35362 Drusen (degenerative) of macula, left eye: Secondary | ICD-10-CM | POA: Insufficient documentation

## 2012-12-01 DIAGNOSIS — H15053 Scleromalacia perforans, bilateral: Secondary | ICD-10-CM | POA: Insufficient documentation

## 2013-03-16 ENCOUNTER — Ambulatory Visit (INDEPENDENT_AMBULATORY_CARE_PROVIDER_SITE_OTHER): Payer: Medicare Other | Admitting: Internal Medicine

## 2013-03-16 ENCOUNTER — Other Ambulatory Visit (INDEPENDENT_AMBULATORY_CARE_PROVIDER_SITE_OTHER): Payer: Medicare Other

## 2013-03-16 ENCOUNTER — Encounter: Payer: Self-pay | Admitting: Internal Medicine

## 2013-03-16 VITALS — BP 120/70 | HR 61 | Temp 97.4°F | Ht 75.0 in | Wt 250.5 lb

## 2013-03-16 DIAGNOSIS — Z0000 Encounter for general adult medical examination without abnormal findings: Secondary | ICD-10-CM

## 2013-03-16 DIAGNOSIS — R259 Unspecified abnormal involuntary movements: Secondary | ICD-10-CM

## 2013-03-16 DIAGNOSIS — R251 Tremor, unspecified: Secondary | ICD-10-CM

## 2013-03-16 DIAGNOSIS — E119 Type 2 diabetes mellitus without complications: Secondary | ICD-10-CM

## 2013-03-16 DIAGNOSIS — N400 Enlarged prostate without lower urinary tract symptoms: Secondary | ICD-10-CM

## 2013-03-16 LAB — URINALYSIS, ROUTINE W REFLEX MICROSCOPIC
Hgb urine dipstick: NEGATIVE
Nitrite: NEGATIVE
RBC / HPF: NONE SEEN (ref 0–?)
Specific Gravity, Urine: 1.015 (ref 1.000–1.030)
Total Protein, Urine: NEGATIVE
Urine Glucose: NEGATIVE
WBC, UA: NONE SEEN (ref 0–?)
pH: 7 (ref 5.0–8.0)

## 2013-03-16 LAB — BASIC METABOLIC PANEL
BUN: 12 mg/dL (ref 6–23)
GFR: 77.28 mL/min (ref >60.00)
Potassium: 4.5 mEq/L (ref 3.5–5.1)
Sodium: 137 mEq/L (ref 135–145)

## 2013-03-16 LAB — LIPID PANEL
HDL: 30.9 mg/dL — ABNORMAL LOW (ref >39.00)
Total CHOL/HDL Ratio: 5
VLDL: 41.8 mg/dL — ABNORMAL HIGH (ref 0.0–40.0)

## 2013-03-16 LAB — CBC WITH DIFFERENTIAL/PLATELET
Basophils Absolute: 0.1 10*3/uL (ref 0.0–0.1)
Basophils Relative: 1.3 % (ref 0.0–3.0)
HCT: 41.1 % (ref 39.0–52.0)
Hemoglobin: 13.7 g/dL (ref 13.0–17.0)
Lymphs Abs: 1.6 10*3/uL (ref 0.7–4.0)
Monocytes Relative: 10.4 % (ref 3.0–12.0)
Neutro Abs: 4.4 10*3/uL (ref 1.4–7.7)
RBC: 5.01 Mil/uL (ref 4.22–5.81)
RDW: 14.6 % (ref 11.5–14.6)

## 2013-03-16 LAB — MICROALBUMIN / CREATININE URINE RATIO: Microalb Creat Ratio: 2.9 mg/g (ref 0.0–30.0)

## 2013-03-16 LAB — HEPATIC FUNCTION PANEL
AST: 31 U/L (ref 0–37)
Total Bilirubin: 0.8 mg/dL (ref 0.3–1.2)

## 2013-03-16 LAB — LDL CHOLESTEROL, DIRECT: Direct LDL: 81.5 mg/dL

## 2013-03-16 NOTE — Progress Notes (Addendum)
Subjective:    Patient ID: [name], male    DOB: Nov 26, 1940, 72 y.o.   MRN: [id number]  HPI  Here for wellness and f/u;  Overall doing ok;  Pt denies CP, worsening SOB, DOE, wheezing, orthopnea, PND, worsening LE edema, palpitations, dizziness or syncope.  Pt denies neurological change such as new headache, facial or extremity weakness.  Pt denies polydipsia, polyuria, or low sugar symptoms. Pt states overall good compliance with treatment and medications, good tolerability, and has been trying to follow lower cholesterol diet.  Pt denies worsening depressive symptoms, suicidal ideation or panic. No fever, night sweats, wt loss, loss of appetite, or other constitutional symptoms.  Pt states good ability with ADL's, has low fall risk, home safety reviewed and adequate, no other significant changes in hearing or vision, and only occasionally active with exercise. Sees DM MD (dr Randa Evens) at the [facility], last a1c 8.2 which is often the case for him, little exercise, on home o2, wt stable however.   Has some sagging skin belo both eyes worse in the past yr but will f/u with optho.  Also with worsening tremor to the RUE in the past yr, now hard to eat and write.  Also s/p cortisone to bilat shoulders per Dr Otelia Sergeant recently - now improved Past Medical History  Diagnosis Date  . THRUSH 11/06/2009  . HYPOTHYROIDISM 07/30/2009  . DIABETES MELLITUS, TYPE II 11/23/2009  . DEPRESSION 11/23/2009  . 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 HYPERTENSION 07/30/2009  . CORONARY ARTERY DISEASE 11/23/2009  . CHRONIC OBSTRUCTIVE PULMONARY DISEASE, ACUTE EXACERBATION 03/01/2010  . C O P D 07/30/2009  . 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 RESPIRATORY FAILURE, CHRONIC 07/31/2009  . BENIGN PROSTATIC HYPERTROPHY 11/23/2009  . DEGENERATIVE JOINT DISEASE 11/23/2009  . 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 FATIGUE 11/23/2009  . 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 GAIT DISTURBANCE 12/10/2009  . DYSPNEA/SHORTNESS OF BREATH 12/08/2009  . HEMOPTYSIS  UNSPECIFIED 05/07/2010  . RA (rheumatoid arthritis) 06/11/2011  . PTSD (post-traumatic stress disorder) 03/10/2012   Past Surgical History  Procedure Laterality Date  . Small bowel obstruction repair with adhesiolysis    . Hx of remote ileum resection due to bleeding    . Abdominal aortic aneurysm repair    . S/p back surgury   1972    lumbar laminectomy Dr. Fannie Knee  . S/p left femoral embolectomy with left leg ischemia      Dr. Hart Rochester, vascular  . S/p right ganglion cyst      Dr. Teressa Senter    reports that he quit smoking about 14 years ago. His smoking use included Cigarettes, Pipe, and Cigars. He has a 100 pack-year smoking history. He has never used smokeless tobacco. He reports that he does not drink alcohol or use illicit drugs. family history is not on file. No Known Allergies Current Outpatient Prescriptions on File Prior to Visit  Medication Sig Dispense Refill  . albuterol (PROAIR HFA) 108 (90 BASE) MCG/ACT inhaler Inhale 2 puffs into the lungs every 4 (four) hours as needed. Shortness of breath  1 Inhaler  6  . albuterol (PROVENTIL) (2.5 MG/3ML) 0.083% nebulizer solution 1 vial in nebulizer every 6 hours as needed Dx 496  120 mL  6  . B-D INS SYR ULTRAFINE 1CC/30G 30G X 1/2" 1 ML MISC       . Cholecalciferol (VITAMIN D3) 2000 UNITS TABS Take 1 tablet by mouth daily.       [name] DEPO-TESTOSTERONE 200 MG/ML injection Inject 200 mg  into the muscle every 28 (twenty-eight) days.       . finasteride (PROSCAR) 5 MG tablet Take 5 mg by mouth daily.        . formoterol (FORADIL AEROLIZER) 12 MCG capsule for inhaler Inhale contents of 1 capsule two times a day       . gabapentin (NEURONTIN) 800 MG tablet Take 800 mg by mouth 2 (two) times daily.        . insulin aspart (NOVOLOG) 100 UNIT/ML injection 10-20 Units 3 (three) times daily before meals. Sliding scale as needed with meals (14 units in the morning 12 units at lunch and 24 units at dinner)      . insulin glargine (LANTUS) 100 UNIT/ML  injection Inject 65 Units into the skin at bedtime. Adjust as needed      . levothyroxine (SYNTHROID, LEVOTHROID) 25 MCG tablet Take 25 mcg by mouth daily.      . metFORMIN (GLUCOPHAGE) 1000 MG tablet Take 1,000 mg by mouth 2 (two) times daily.        . mometasone (ASMANEX 60 METERED DOSES) 220 MCG/INH inhaler Inhale 2 puffs into the lungs daily.        . Multiple Vitamins-Minerals (MEGA MULTIVITAMIN FOR MEN PO) Take by mouth daily.        . naphazoline (CLEAR EYES) 0.012 % ophthalmic solution Place 1 drop into both eyes as needed. Red eyes       . Nebulizers (COMPRESSOR/NEBULIZER) MISC Use as directed Dx 496  1 each  0  . PLAQUENIL 200 MG tablet Take 1 tablet by mouth Daily.      . predniSONE (DELTASONE) 10 MG tablet Take 4 each am x 2 days, 2 each am x 2 days, 1 each am x 2 days then stop  14 tablet  0  . simvastatin (ZOCOR) 20 MG tablet Take 5 mg by mouth at bedtime.       . Tamsulosin HCl (FLOMAX) 0.4 MG CAPS Take by mouth at bedtime.        [name] tiotropium (SPIRIVA HANDIHALER) 18 MCG inhalation capsule Place 18 mcg into inhaler and inhale daily.        . furosemide (LASIX) 40 MG tablet Take 1 tablet (40 mg total) by mouth daily. As needed for weight over 252 lbs  90 tablet  3   No current facility-administered medications on file prior to visit.   Review of Systems  Constitutional: Negative for unexpected weight change, or unusual diaphoresis  HENT: Negative for tinnitus.   Eyes: Negative for photophobia and visual disturbance.  Respiratory: Negative for choking and stridor.   Gastrointestinal: Negative for vomiting and blood in stool.  Genitourinary: Negative for hematuria and decreased urine volume.  Musculoskeletal: Negative for acute joint swelling Skin: Negative for color change and wound.  Neurological: Negative for tremors and numbness other than noted  Psychiatric/Behavioral: Negative for decreased concentration or  hyperactivity.       Objective:   Physical Exam BP 120/70   Pulse 61  Temp(Src) 97.4 F (36.3 C) (Oral)  Ht 6\' 3"  (1.905 m)  Wt 250 lb 8 oz (113.626 kg)  BMI 31.31 kg/m2  SpO2 93% VS noted,  Constitutional: Pt is oriented to person, place, and time. Appears well-developed and well-nourished.  Head: Normocephalic and atraumatic.  Right Ear: External ear normal.  Left Ear: External ear normal.  Nose: Nose normal.  Mouth/Throat: Oropharynx is clear and moist.  Eyes: Conjunctivae and EOM are normal. Pupils are equal, round, and  reactive to light.  Neck: Normal range of motion. Neck supple. No JVD present. No tracheal deviation present.  Cardiovascular: Normal rate, regular rhythm, normal heart sounds and intact distal pulses.   Pulmonary/Chest: Effort normal and breath sounds decreased, no wheezing.  Abdominal: Soft. Bowel sounds are normal. There is no tenderness. No HSM  Musculoskeletal: Normal range of motion. Exhibits no edema.  Lymphadenopathy:  Has no cervical adenopathy.  Neurological: Pt is alert and oriented to person, place, and time. Pt has normal reflexes. No cranial nerve deficit.  Skin: Skin is warm and dry. No rash noted.  Psychiatric:  Has  normal mood and affect. Behavior is normal.  Tremor right hand noted    Assessment & Plan:  Quality Measures addressed:   Diabetes Blood Pressure < 140/90: pt declines further medication change at this time Diabetes LDL < 100: pt declines further medication Diabetes Hgba1c < 8%: pt declines further medication Diabetes Aspirin use: pt declines further medication CAD  - drug therapy for lower cholesterol: pt declines further medication ACE/ARB therapy for CAD, Diabetes, and/or LVSD: pt declines further medication

## 2013-03-16 NOTE — Assessment & Plan Note (Signed)

## 2013-03-16 NOTE — Assessment & Plan Note (Signed)
Sees Dr Allena Katz, rheum at [facility], no new complaints, has also seen rheum at [facility], also sees optho Dr Loleta Books eye for rheum eye condition

## 2013-03-16 NOTE — Assessment & Plan Note (Signed)
stable overall by history and exam, recent data reviewed with pt, and pt to continue medical treatment as before,  to f/u any worsening symptoms or concerns Lab Results  Component Value Date   HGBA1C 8.0* 01/26/2012

## 2013-03-16 NOTE — Patient Instructions (Signed)
There are no new medications today Please continue all other medications as before, and refills have been done if requested. Please have the pharmacy call with any other refills you may need Please continue your efforts at being more active, low cholesterol diet, and weight control. You are otherwise up to date with prevention measures today. Please keep your appointments with your specialists as you have planned, and the [facility] Please go to the LAB in the Basement (turn left off the elevator) for the tests to be done today You will be contacted by phone if any changes need to be made immediately.  Otherwise, you will receive a letter about your results with an explanation, but please check with MyChart first.  Please remember to sign up for My Chart if you have not done so, as this will be important to you in the future with finding out test results, communicating by private email, and scheduling acute appointments online when needed.  Please return in 6 months, or sooner if needed

## 2013-03-16 NOTE — Assessment & Plan Note (Signed)
prob essential tremor, should avoid beta blocker with resp issues; for neuro referral

## 2013-04-04 ENCOUNTER — Telehealth: Payer: Self-pay

## 2013-04-04 NOTE — Telephone Encounter (Signed)
Called and spoke to patients wife they will be here to see Dr.Yan 04-05-2013.

## 2013-04-05 ENCOUNTER — Encounter: Payer: Self-pay | Admitting: Neurology

## 2013-04-05 ENCOUNTER — Ambulatory Visit (INDEPENDENT_AMBULATORY_CARE_PROVIDER_SITE_OTHER): Payer: Medicare Other | Admitting: Neurology

## 2013-04-05 VITALS — BP 145/74 | HR 72 | Temp 97.4°F | Ht 76.0 in | Wt 254.0 lb

## 2013-04-05 DIAGNOSIS — F329 Major depressive disorder, single episode, unspecified: Secondary | ICD-10-CM

## 2013-04-05 DIAGNOSIS — E119 Type 2 diabetes mellitus without complications: Secondary | ICD-10-CM

## 2013-04-05 DIAGNOSIS — E039 Hypothyroidism, unspecified: Secondary | ICD-10-CM

## 2013-04-05 MED ORDER — PRIMIDONE 50 MG PO TABS: 50.0000 mg | ORAL_TABLET | Freq: Four times a day (QID) | ORAL | Status: DC

## 2013-04-05 NOTE — Progress Notes (Signed)
[facility]  PATIENT: [name] DOB: 1940-11-25  HISTORICAL  [name] is a 72 yo RH WM, alone at today's clinical visit.  He has past medical history of COPD, hypertension, diabetes,hypothyroidism, hyperlipidemia, previous low back surgery for herniated disc ( not sure of levels) and left radiculopathy, also developed diabetic complications including peripheral neuropathy.  he has a history of aortic aneurysm repair.  I saw him initially in 2012 for couple years history of gradual onset unsteady gait, he tends to veer to the left side. he has bilateral feet paresthesia,  from  diabetic peripheral neuropathy, his gait difficulty has gradually worsened over the past 2 ys, he also complains of shortness of breath with prolonged walking, it was thought that his gait difficulty is due to his general deconditioning, COPD,  Today he came back complains of 2 years history of right hand shaking, getting worse over past 1 year, occasionally left hand shaking as well, difficulty holding utensils, signing his name, he has no loss of sense of smell, he has no sleep difficulty, he is using oxygen 24x7.  [facility], Vit B12, TSH, mild low Vit D25.8.  He noticed that his major limitation is from his sob. MRI braine was normal in 2011     PAST MEDICAL HISTORY: Past Medical History  Diagnosis Date  . THRUSH 11/06/2009  . HYPOTHYROIDISM 07/30/2009  . DIABETES MELLITUS, TYPE II 11/23/2009  . DEPRESSION 11/23/2009  . 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 HYPERTENSION 07/30/2009  . CORONARY ARTERY DISEASE 11/23/2009  . C O P D 07/30/2009  . 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 RESPIRATORY FAILURE, CHRONIC 07/31/2009  . BENIGN PROSTATIC HYPERTROPHY 11/23/2009  . DEGENERATIVE JOINT DISEASE 11/23/2009  . 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 GAIT DISTURBANCE 12/10/2009  . DYSPNEA/SHORTNESS OF BREATH 12/08/2009  . HEMOPTYSIS  UNSPECIFIED 05/07/2010  . RA (rheumatoid arthritis) 06/11/2011  . PTSD (post-traumatic stress disorder)-[place] 03/10/2012    PAST SURGICAL HISTORY: Past Surgical History  Procedure Laterality Date  . Small bowel obstruction repair with adhesiolysis    . Hx of remote ileum resection due to bleeding    . Abdominal aortic aneurysm repair    . S/p back surgury   1972    lumbar laminectomy Dr. Fannie Knee  . S/p left femoral embolectomy with left leg ischemia,       Dr. Hart Rochester, vascular  . S/p right ganglion cyst      Dr. Teressa Senter    FAMILY HISTORY: Father, died of emphyesema at age 31 Mother died of gunshot wound at 54.  SOCIAL HISTORY:  History   Social History  . Marital Status: Married    Spouse Name: N/A    Number of Children: 1  . Years of Education: N/A   Occupational History  . disabled veteran, Ex Human resources officer    Social History Main Topics  . Smoking status: Former Smoker -- 2.50 packs/day for 40 years    Types: Cigarettes, Pipe, Cigars    Quit date: 07/28/1998  . Smokeless tobacco: Never Used     Comment: quit in 2000 x 40 yrs. 2.5 ppd  . Alcohol Use: No  . Drug Use: No  . Sexual Activity: Not on file    Social History Narrative   Lives in the home with his wife   Sees [place] every 6 month for meds.   Denies asbestos exposure    PHYSICAL EXAM    Filed  Vitals:   04/05/13 0806  BP: 145/74  Pulse: 72  Temp: 97.4 F (36.3 C)  TempSrc: Oral  Height: 6\' 4"  (1.93 m)  Weight: 254 lb (115.214 kg)    Body mass index is 30.93 kg/(m^2).  Physical Examination  Mental Status:  pleasant, awake, alert, cooperative to history, talking, and casual conversation. He wears nasal oxygen   Cranial Nerves:  CN II-XII Pupils were equal round reactive to light.  Extraocular movements were full.  dark discoloration of sclera.  Visual fields were full on confrontational test.  Facial sensation and strength were normal.  Hearing was intact to finger rubbing bilaterally.  Uvula  tongue were midline.  Head turning and shoulder shrugging were normal and symmetric.  Tongue protrusion into the cheeks strength were normal.  Motor:  mild lower lip tremor, right more than left hand tremor, slight ridigity, no nuchal rigidity  Deep tendon reflexes:  Biceps: 2/2, Brachioradialis: 2/2, Triceps: 2/2, Patellar: 2/2, Achilles: absent  Plantar responses were flexor.  Sensory:  length dependent decreased light touch, pinprick, and vibratory sensation to midshin level, decreased proprioception at toes..  Cordination:  Normal finger-to-nose, heel-to-shin.  There was no dysmetria noticed.  Gait:  Narrow based and steady, good stride, smooth turning.     DIAGNOSTIC DATA (LABS, IMAGING, TESTING) - I reviewed patient records, labs, notes, testing and imaging myself where available.  Lab Results  Component Value Date   WBC 7.3 03/16/2013   HGB 13.7 03/16/2013   HCT 41.1 03/16/2013   MCV 82.1 03/16/2013   PLT 178.0 03/16/2013      Component Value Date/Time   NA 137 03/16/2013 1056   K 4.5 03/16/2013 1056   CL 98 03/16/2013 1056   CO2 32 03/16/2013 1056   GLUCOSE 154* 03/16/2013 1056   BUN 12 03/16/2013 1056   CREATININE 1.0 03/16/2013 1056   CALCIUM 9.3 03/16/2013 1056   PROT 7.1 03/16/2013 1056   ALBUMIN 4.2 03/16/2013 1056   AST 31 03/16/2013 1056   ALT 33 03/16/2013 1056   ALKPHOS 62 03/16/2013 1056   BILITOT 0.8 03/16/2013 1056   GFRNONAA 51* 05/08/2011 0400   GFRAA 59* 05/08/2011 0400   Lab Results  Component Value Date   CHOL 154 03/16/2013   HDL 30.90* 03/16/2013   LDLCALC 97 02/18/2011   LDLDIRECT 81.5 03/16/2013   TRIG 209.0* 03/16/2013   CHOLHDL 5 03/16/2013   Lab Results  Component Value Date   HGBA1C 8.1* 03/16/2013   Lab Results  Component Value Date   VITAMINB12 333 11/23/2009   Lab Results  Component Value Date   TSH 0.29* 03/16/2013    ASSESSMENT AND PLAN   72 yo Caucasian male with PMhx of COPD, now presenting with his right hand tremor, postural, no  rigidity, most consistent with essential tremor,   1. He desires treatment, will try primidone 50mg  tid. 2. RTC in 6 months with Gerlene Fee, M.D. Ph.D.  [facility] [address] [place], [place] [address] [phone]

## 2013-04-06 LAB — TSH: TSH: 6.44 u[IU]/mL — ABNORMAL HIGH (ref 0.450–4.500)

## 2013-04-06 NOTE — Progress Notes (Signed)
Quick Note:  Please call patient, elevated TSH indicating hypothyroidism, he was on thyroid supplement in the past, please check to see who is managing his thyroid function now, may need adjustment of his medications. ______

## 2013-04-07 ENCOUNTER — Telehealth: Payer: Self-pay

## 2013-04-07 MED ORDER — PRIMIDONE 50 MG PO TABS: ORAL_TABLET | ORAL | Status: DC

## 2013-04-07 NOTE — Progress Notes (Signed)
Quick Note:  Spoke to wife and relayed elevated TSH, per Dr. Terrace Arabia. Wife said that Dr. Jonny Ruiz is managing his thyroid function and they will let him know about labs to see if adjustment of medicine is needed. ______

## 2013-04-07 NOTE — Telephone Encounter (Signed)
Pharmacy called wanting to clarify Primidone Rx.  It was sent with 2 sets of directions.  OV note says: 1. He desires treatment, will try primidone 50mg  tid.  I have updated and resent the Rx.

## 2013-04-20 ENCOUNTER — Encounter: Payer: Self-pay | Admitting: Internal Medicine

## 2013-04-20 NOTE — Telephone Encounter (Signed)
Truddie Hidden, can the charges be changed to level 4 for the diagnoses above? thanks

## 2013-06-02 ENCOUNTER — Other Ambulatory Visit: Payer: Self-pay

## 2013-07-13 ENCOUNTER — Telehealth: Payer: Self-pay | Admitting: Internal Medicine

## 2013-07-13 NOTE — Telephone Encounter (Signed)
I spoke with pt. He scheduled an appt with MW in the AM at 9:15. Nothing further needed

## 2013-07-14 ENCOUNTER — Encounter: Payer: Self-pay | Admitting: Internal Medicine

## 2013-07-14 ENCOUNTER — Ambulatory Visit (INDEPENDENT_AMBULATORY_CARE_PROVIDER_SITE_OTHER)
Admission: RE | Admit: 2013-07-14 | Discharge: 2013-07-14 | Disposition: A | Discharge status: Home / Self Care | Payer: Medicare Other | Source: Ambulatory Visit | Attending: Internal Medicine | Admitting: Internal Medicine

## 2013-07-14 ENCOUNTER — Ambulatory Visit (INDEPENDENT_AMBULATORY_CARE_PROVIDER_SITE_OTHER): Payer: Medicare Other | Admitting: Internal Medicine

## 2013-07-14 VITALS — BP 150/68 | HR 92 | Temp 99.4°F | Ht 75.0 in | Wt 246.8 lb

## 2013-07-14 DIAGNOSIS — J441 Chronic obstructive pulmonary disease with (acute) exacerbation: Secondary | ICD-10-CM

## 2013-07-14 DIAGNOSIS — J449 Chronic obstructive pulmonary disease, unspecified: Secondary | ICD-10-CM

## 2013-07-14 DIAGNOSIS — J9610 Chronic respiratory failure, unspecified whether with hypoxia or hypercapnia: Secondary | ICD-10-CM

## 2013-07-14 MED ORDER — AZITHROMYCIN 250 MG PO TABS: ORAL_TABLET | ORAL | Status: DC

## 2013-07-14 MED ORDER — PREDNISONE 10 MG PO TABS: ORAL_TABLET | ORAL | Status: DC

## 2013-07-14 NOTE — Progress Notes (Signed)
Subjective:    Patient ID: [name], male    DOB: October 19, 1940, 72 y.o.   MRN: [id number]  HPI    19 yowm quit smoking around 2000 with GOLD III COPD on 3-4 L o2. Meds through [facility]  He was hospitalised in 10/12 for acute on chronic hypercarbic resp failure. He also has gr 2 diastolic dysfunction & improved with diuresis . He was started on VPAP S auto on discharge [facility])  PSG 10/15 showed severe OSA, AHI 48/h with nadir desatn 78% correctd by CPAP 12 cm, 3 L O2 blended in , c flex +2 cm, humidity, A medium full face mask was used.  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 Desaturations persisted without resp events on CPAP 12 cm   Download 11/19-12/11/12 shows good control of events on CPAP 16 cm, good usage   08/31/2012  68m FU  Underwent Lung transplant eval - he finally decided to come off the list CPAP was at 12 cm but increased to 16 cm at his request in the past 7/13 >> Dropped pressure to 14 cm , Increase C flex to +3 On 3 L at rest  & sleep, pressure ok, mask OK Neurontin bid - around 7pm Monitor wts daily & takes lasix for increase in 2lbs or more  Breathing is unchanged. Still using CPAP every night for 8-9 hours. Denies chest pain, chest tightness, SOB or coughing. On 8L O2 with exercise   OV 09/27/2012 This is returns for routine followup of COPD with some underlying pulmonary fibrosis but dominant component being COPD. Next issue is, obesity  In terms of obesity he has not lost any weightIN terms of weight: struggling with diet due to lack of participation from wife. HE says he will go back to diet and gym.   Estimated body mass index is 31.42 kg/(m^2) as calculated from the following:   Height as of this encounter: 6\' 3"  (1.905 m).   Weight as of this encounter: 251 lb 6.4 oz (114.034 kg).  In terms of COPD, Came off ttransplant list because he has copd more dominant than pulmonary fibrosis. They  have advised him that fev1 needs to drop < 30% for transplant. This was too much for him and all social logisitics and with age and with wife with copd and prognosis of transplant. So, he had family discussion and decided against transplant.    07/14/2013 f/u ov/[name] re:  Chief Complaint  Patient presents with  . Acute Visit    Pt c/o cough- prod with bloody sputum x 2 days. Also c/o slight increase in SOB and chills.   was doing fine on 24/7 on 2 at rest, 4-5 with heavy ex like at the gym then started with yellow mucus then turned slt bloody, low grade fever, contact with sick grandchild  Doesn't use saba hfa or neb at baseline and only using at most twice daily saba hfa since onset of cough  No obvious day to day or daytime variabilty or assoc   cp or chest tightness, subjective wheeze overt sinus or hb symptoms. No unusual exp hx or h/o childhood pna/ asthma or knowledge of premature birth.  Sleeping ok without nocturnal  or early am exacerbation  of respiratory  c/o's or need for noct saba. Also denies any obvious fluctuation of symptoms with weather or environmental changes or other aggravating or alleviating factors except as outlined above   Current Medications, Allergies, Complete  Past Medical History, Past Surgical History, Family History, and Social History were reviewed in [name].  ROS  The following are not active complaints unless bolded sore throat, dysphagia, dental problems, itching, sneezing,  nasal congestion or excess/ purulent secretions, ear ache,   fever, chills, sweats, unintended wt loss, pleuritic or exertional cp, hemoptysis,  orthopnea pnd or leg swelling, presyncope, palpitations, heartburn, abdominal pain, anorexia, nausea, vomiting, diarrhea  or change in bowel or urinary habits, change in stools or urine, dysuria,hematuria,  rash, arthralgias, visual complaints, headache, numbness weakness or ataxia or problems with walking or  coordination,  change in mood/affect or memory.                  Objective:   Physical Exam  Nursing note and vitals reviewed. Constitutional: He is oriented to person, place, and time. He appears well-developed and well-nourished. No distress.  Body mass index is 31.42 kg/(m^2).   HENT:  Head: Normocephalic and atraumatic.  Right Ear: External ear normal.  Left Ear: External ear normal.  Mouth/Throat: Oropharynx is clear and moist. No oropharyngeal exudate.  Thrush +  Eyes: Conjunctivae and EOM are normal. Pupils are equal, round, and reactive to light. Right eye exhibits no discharge. Left eye exhibits no discharge. No scleral icterus.  Neck: Normal range of motion. Neck supple. No JVD present. No tracheal deviation present. No thyromegaly present.  Cardiovascular: Normal rate, regular rhythm and intact distal pulses.  Exam reveals no gallop and no friction rub.   No murmur heard. Pulmonary/Chest: Effort normal and breath sounds normal. No respiratory distress. He has no wheezes. He has no rales. He exhibits no tenderness.  Purse lip breathing + Oxygen on   Abdominal: Soft. Bowel sounds are normal. He exhibits no distension and no mass. There is no tenderness. There is no rebound and no guarding.  Musculoskeletal: Normal range of motion. He exhibits no edema and no tenderness.  Lymphadenopathy:    He has no cervical adenopathy.  Neurological: He is alert and oriented to person, place, and time. He has normal reflexes. No cranial nerve deficit. Coordination normal.  Skin: Skin is warm and dry. No rash noted. He is not diaphoretic. No erythema. No pallor.  Psychiatric: He has a normal mood and affect. His behavior is normal. Judgment and thought content normal.      CXR  07/14/2013 :  Changes within the right lung consistent with an acute infiltrate. The appearance on the lateral is mostly within the lower lobe. Followup films to resolution are recommended     Assessment  & Plan:

## 2013-07-14 NOTE — Assessment & Plan Note (Addendum)
Assoc with slt purulent/ slt bloody secretions but minimal changes on cxr p exp to sick child so rec Zpak/ pred x 6/ mucinex dm   If not improving rx levaquin 500 x 7 days

## 2013-07-14 NOTE — Assessment & Plan Note (Signed)
-   HFA 50% July 31, 2009 >   90% September 26, 2009 > 90% February 07, 2010       - PFT's September 26, 2009 FEV1 1.27 (38%) ratio 36 with 30% improvement  after B2, DLC0 35 corrects to 66       -CT sinus neg.  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 6% BD respinse DLCO 43%, TLC 84%  His chronic symptoms  are   disproportionate to objective findings and not clear this is all due to his lung problems but pt does appear to have difficult airway management issues. DDX of  difficult airways managment all start with A and  include Adherence, Ace Inhibitors, Acid Reflux, Active Sinus Disease, Alpha 1 Antitripsin deficiency, Anxiety masquerading as Airways dz,  ABPA,  allergy(esp in young), Aspiration (esp in elderly), Adverse effects of DPI,  Active smokers, plus two Bs  = Bronchiectasis and Beta blocker use..and one C= CHF  Adherence is always the initial "prime suspect" and is a multilayered concern that requires a "trust but verify" approach in every patient - starting with knowing how to use medications, especially inhalers, correctly, keeping up with refills and understanding the fundamental difference between maintenance and prns vs those medications only taken for a very short course and then stopped and not refilled.   Reviewed approp use of hfa saba vs neb  ? Adverse effects of dpi's > he's on 3 of them with chronic hoarsness > may need to consider change to hfa or neb at some point for his ics/laba combo rather than foradil/asthmanex (not approved for copd but on it chronically so leave on for now)

## 2013-07-14 NOTE — Progress Notes (Signed)
Quick Note:  Spoke with pt and notified of results per Dr. Wert. Pt verbalized understanding and denied any questions.  ______ 

## 2013-07-14 NOTE — Patient Instructions (Addendum)
zpak take as directed  Prednisone 10 mg take  4 each am x 2 days,   2 each am x 2 days,  1 each am x 2 days and stop   For cough > mucinex dm 1200 mg every 12 hours as needed  For breathing > ok to use albuterol up to every 4 hours if needed   Please remember to go to the x-ray department downstairs for your tests - we will call you with the results when they are available.

## 2013-07-14 NOTE — Assessment & Plan Note (Signed)
Adequate control on present rx, reviewed > no change in rx needed   

## 2013-07-19 ENCOUNTER — Emergency Department (HOSPITAL_BASED_OUTPATIENT_CLINIC_OR_DEPARTMENT_OTHER)
Admission: EM | Admit: 2013-07-19 | Discharge: 2013-07-19 | Disposition: A | Discharge status: Home / Self Care | Payer: Medicare Other | Attending: Emergency Medicine | Admitting: Emergency Medicine

## 2013-07-19 ENCOUNTER — Emergency Department (HOSPITAL_BASED_OUTPATIENT_CLINIC_OR_DEPARTMENT_OTHER): Payer: Medicare Other

## 2013-07-19 ENCOUNTER — Encounter (HOSPITAL_BASED_OUTPATIENT_CLINIC_OR_DEPARTMENT_OTHER): Payer: Self-pay | Admitting: Emergency Medicine

## 2013-07-19 ENCOUNTER — Telehealth: Payer: Self-pay | Admitting: Internal Medicine

## 2013-07-19 DIAGNOSIS — M549 Dorsalgia, unspecified: Secondary | ICD-10-CM

## 2013-07-19 DIAGNOSIS — E119 Type 2 diabetes mellitus without complications: Secondary | ICD-10-CM | POA: Insufficient documentation

## 2013-07-19 DIAGNOSIS — M545 Low back pain, unspecified: Secondary | ICD-10-CM | POA: Insufficient documentation

## 2013-07-19 DIAGNOSIS — I10 Essential (primary) hypertension: Secondary | ICD-10-CM | POA: Insufficient documentation

## 2013-07-19 DIAGNOSIS — N400 Enlarged prostate without lower urinary tract symptoms: Secondary | ICD-10-CM | POA: Insufficient documentation

## 2013-07-19 DIAGNOSIS — J159 Unspecified bacterial pneumonia: Secondary | ICD-10-CM | POA: Insufficient documentation

## 2013-07-19 DIAGNOSIS — Z792 Long term (current) use of antibiotics: Secondary | ICD-10-CM | POA: Insufficient documentation

## 2013-07-19 DIAGNOSIS — J4489 Other specified chronic obstructive pulmonary disease: Secondary | ICD-10-CM | POA: Insufficient documentation

## 2013-07-19 DIAGNOSIS — Z8619 Personal history of other infectious and parasitic diseases: Secondary | ICD-10-CM | POA: Insufficient documentation

## 2013-07-19 DIAGNOSIS — IMO0002 Reserved for concepts with insufficient information to code with codable children: Secondary | ICD-10-CM | POA: Insufficient documentation

## 2013-07-19 DIAGNOSIS — E039 Hypothyroidism, unspecified: Secondary | ICD-10-CM | POA: Insufficient documentation

## 2013-07-19 DIAGNOSIS — Z8669 Personal history of other diseases of the nervous system and sense organs: Secondary | ICD-10-CM | POA: Insufficient documentation

## 2013-07-19 DIAGNOSIS — Z87891 Personal history of nicotine dependence: Secondary | ICD-10-CM | POA: Insufficient documentation

## 2013-07-19 DIAGNOSIS — F3289 Other specified depressive episodes: Secondary | ICD-10-CM | POA: Insufficient documentation

## 2013-07-19 DIAGNOSIS — J189 Pneumonia, unspecified organism: Secondary | ICD-10-CM

## 2013-07-19 DIAGNOSIS — Z79899 Other long term (current) drug therapy: Secondary | ICD-10-CM | POA: Insufficient documentation

## 2013-07-19 DIAGNOSIS — F329 Major depressive disorder, single episode, unspecified: Secondary | ICD-10-CM | POA: Insufficient documentation

## 2013-07-19 DIAGNOSIS — J449 Chronic obstructive pulmonary disease, unspecified: Secondary | ICD-10-CM | POA: Insufficient documentation

## 2013-07-19 DIAGNOSIS — Z794 Long term (current) use of insulin: Secondary | ICD-10-CM | POA: Insufficient documentation

## 2013-07-19 DIAGNOSIS — I2510 Atherosclerotic heart disease of native coronary artery without angina pectoris: Secondary | ICD-10-CM | POA: Insufficient documentation

## 2013-07-19 LAB — COMPREHENSIVE METABOLIC PANEL
ALT: 23 U/L (ref 0–53)
AST: 22 U/L (ref 0–37)
Alkaline Phosphatase: 72 U/L (ref 39–117)
BUN: 18 mg/dL (ref 6–23)
CO2: 28 mEq/L (ref 19–32)
Calcium: 9.8 mg/dL (ref 8.4–10.5)
Creatinine, Ser: 1 mg/dL (ref 0.50–1.35)
GFR calc Af Amer: 85 mL/min — ABNORMAL LOW (ref >90)
GFR calc non Af Amer: 74 mL/min — ABNORMAL LOW (ref >90)
Sodium: 138 mEq/L (ref 135–145)
Total Bilirubin: 0.5 mg/dL (ref 0.3–1.2)
Total Protein: 7.4 g/dL (ref 6.0–8.3)

## 2013-07-19 LAB — URINALYSIS W MICROSCOPIC + REFLEX CULTURE
Glucose, UA: NEGATIVE mg/dL
Hgb urine dipstick: NEGATIVE
Ketones, ur: NEGATIVE mg/dL
Protein, ur: NEGATIVE mg/dL
Specific Gravity, Urine: 1.023 (ref 1.005–1.030)
Urobilinogen, UA: 1 mg/dL (ref 0.0–1.0)

## 2013-07-19 LAB — CBC WITH DIFFERENTIAL/PLATELET
Basophils Absolute: 0 10*3/uL (ref 0.0–0.1)
Eosinophils Absolute: 0.3 10*3/uL (ref 0.0–0.7)
Eosinophils Relative: 3 % (ref 0–5)
HCT: 41.6 % (ref 39.0–52.0)
MCH: 26.4 pg (ref 26.0–34.0)
MCHC: 32.9 g/dL (ref 30.0–36.0)
MCV: 80.3 fL (ref 78.0–100.0)
Monocytes Absolute: 1.1 10*3/uL — ABNORMAL HIGH (ref 0.1–1.0)
Neutro Abs: 6.3 10*3/uL (ref 1.7–7.7)
Platelets: 181 10*3/uL (ref 150–400)
RDW: 14 % (ref 11.5–15.5)

## 2013-07-19 MED ORDER — IOHEXOL 350 MG/ML SOLN
125.0000 mL | Freq: Once | INTRAVENOUS | Status: AC | PRN
  Administered 2013-07-19: 125 mL via INTRAVENOUS

## 2013-07-19 MED ORDER — OXYCODONE-ACETAMINOPHEN 5-325 MG PO TABS
1.0000 | ORAL_TABLET | Freq: Once | ORAL | Status: AC
  Administered 2013-07-19: 1 via ORAL
  Filled 2013-07-19: qty 1

## 2013-07-19 MED ORDER — PREDNISONE 10 MG PO TABS: ORAL_TABLET | ORAL | Status: DC

## 2013-07-19 MED ORDER — OXYCODONE-ACETAMINOPHEN 5-325 MG PO TABS: 1.0000 | ORAL_TABLET | Freq: Four times a day (QID) | ORAL | Status: DC | PRN

## 2013-07-19 MED ORDER — LEVOFLOXACIN 500 MG PO TABS: 500.0000 mg | ORAL_TABLET | Freq: Every day | ORAL | Status: DC

## 2013-07-19 NOTE — Telephone Encounter (Signed)
Pt is aware of MR recs. Rx's have been sent in. Nothing further is needed.

## 2013-07-19 NOTE — ED Notes (Signed)
Pt declines iv access, labs, or to change his home o2 to our wall o2..patient states "I'm not going to be here long enough to need that!"

## 2013-07-19 NOTE — Telephone Encounter (Signed)
Repeat prednisone  Take prednisone 40 mg daily x 2 days, then 20mg  daily x 2 days, then 10mg  daily x 2 days, then 5mg  daily x 2 days and stop  Differnet abx   take levaquin 500mg  once daily  X 5 days   If worsening or not better,   Go to ER without hesitatin. Do not procrastinate because it is xmas  Dr. Kalman Shan, M.D., [facility].P Pulmonary and Critical Care Medicine Staff Physician  System [facility] Pager: [phone], If no answer or between  15:00h - 7:00h: call [phone]  07/19/2013 10:37 AM  \

## 2013-07-19 NOTE — ED Provider Notes (Signed)
CSN: [id number]     Arrival date & time 07/19/13  1529 History   First MD Initiated Contact with Patient 07/19/13 1622     Chief Complaint  Patient presents with  . Back Pain  . Pneumonia   (Consider location/radiation/quality/duration/timing/severity/associated sxs/prior Treatment) Patient is a 72 y.o. male presenting with back pain and pneumonia. The history is provided by the patient.  Back Pain Location:  Lumbar spine Quality:  Aching Radiates to:  Does not radiate Pain severity:  Moderate Pain is:  Same all the time Onset quality:  Gradual Duration:  1 day Timing:  Constant Progression:  Unchanged Chronicity:  New Context comment:  Spontaneously Relieved by:  Nothing Worsened by:  Nothing tried Ineffective treatments:  None tried Associated symptoms: no abdominal pain, no chest pain, no dysuria, no fever, no headaches and no numbness   Pneumonia Pertinent negatives include no chest pain, no abdominal pain, no headaches and no shortness of breath.    Past Medical History  Diagnosis Date  . THRUSH 11/06/2009  . HYPOTHYROIDISM 07/30/2009  . DIABETES MELLITUS, TYPE II 11/23/2009  . DEPRESSION 11/23/2009  . 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 HYPERTENSION 07/30/2009  . CORONARY ARTERY DISEASE 11/23/2009  . CHRONIC OBSTRUCTIVE PULMONARY DISEASE, ACUTE EXACERBATION 03/01/2010  . C O P D 07/30/2009  . 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 RESPIRATORY FAILURE, CHRONIC 07/31/2009  . BENIGN PROSTATIC HYPERTROPHY 11/23/2009  . DEGENERATIVE JOINT DISEASE 11/23/2009  . 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 FATIGUE 11/23/2009  . 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 GAIT DISTURBANCE 12/10/2009  . DYSPNEA/SHORTNESS OF BREATH 12/08/2009  . HEMOPTYSIS UNSPECIFIED 05/07/2010  . RA (rheumatoid arthritis) 06/11/2011  . PTSD (post-traumatic stress disorder) 03/10/2012   Past Surgical History  Procedure Laterality Date  . Small bowel obstruction repair with adhesiolysis    . Hx of remote ileum resection due to  bleeding    . Abdominal aortic aneurysm repair    . S/p back surgury   1972    lumbar laminectomy Dr. Fannie Knee  . S/p left femoral embolectomy with left leg ischemia      Dr. Hart [place], vascular  . S/p right ganglion cyst      Dr. Teressa Senter   No family history on file. History  Substance Use Topics  . Smoking status: Former Smoker -- 2.50 packs/day for 40 years    Types: Cigarettes, Pipe, Cigars    Quit date: 07/28/1998  . Smokeless tobacco: Never Used     Comment: quit in 2000 x 40 yrs. 2.5 ppd  . Alcohol Use: No    Review of Systems  Constitutional: Negative for fever.  HENT: Negative for drooling and rhinorrhea.   Eyes: Negative for pain.  Respiratory: Negative for cough and shortness of breath.   Cardiovascular: Negative for chest pain and leg swelling.  Gastrointestinal: Negative for nausea, vomiting, abdominal pain and diarrhea.  Genitourinary: Negative for dysuria and hematuria.  Musculoskeletal: Positive for back pain. Negative for gait problem and neck pain.  Skin: Negative for color change.  Neurological: Negative for numbness and headaches.  Hematological: Negative for adenopathy.  Psychiatric/Behavioral: Negative for behavioral problems.  All other systems reviewed and are negative.    Allergies  Review of patient's allergies indicates no known allergies.  Home Medications   Current Outpatient Rx  Name  Route  Sig  Dispense  Refill  . Acetylcysteine (NAC) 600 MG CAPS   Oral   Take 1 capsule by mouth daily.         [name]  albuterol (PROAIR HFA) 108 (90 BASE) MCG/ACT inhaler   Inhalation   Inhale 2 puffs into the lungs every 4 (four) hours as needed. Shortness of breath   1 Inhaler   6   . albuterol (PROVENTIL) (2.5 MG/3ML) 0.083% nebulizer solution      1 vial in nebulizer every 6 hours as needed Dx 496   120 mL   6   . azithromycin (ZITHROMAX) 250 MG tablet      Take 2 on day one then 1 daily x 4 days   6 tablet   0   . B-D INS SYR ULTRAFINE 1CC/30G  30G X 1/2" 1 ML MISC               . Cholecalciferol (VITAMIN D3) 2000 UNITS TABS   Oral   Take 1 tablet by mouth daily.          [name] DEPO-TESTOSTERONE 200 MG/ML injection   Intramuscular   Inject 200 mg into the muscle every 28 (twenty-eight) days.          . formoterol (FORADIL AEROLIZER) 12 MCG capsule for inhaler      Inhale contents of 1 capsule two times a day          . gabapentin (NEURONTIN) 800 MG tablet   Oral   Take 800 mg by mouth 2 (two) times daily.           . insulin aspart (NOVOLOG) 100 UNIT/ML injection      10-20 Units 3 (three) times daily before meals. Sliding scale as needed with meals (14 units in the morning 12 units at lunch and 24 units at dinner)         . insulin glargine (LANTUS) 100 UNIT/ML injection   Subcutaneous   Inject 65 Units into the skin at bedtime. Adjust as needed         . levofloxacin (LEVAQUIN) 500 MG tablet   Oral   Take 1 tablet (500 mg total) by mouth daily.   5 tablet   0   . EXPIRED: levothyroxine (SYNTHROID, LEVOTHROID) 25 MCG tablet   Oral   Take 25 mcg by mouth daily.         . metFORMIN (GLUCOPHAGE) 1000 MG tablet   Oral   Take 1,000 mg by mouth 2 (two) times daily.           . mometasone (ASMANEX 60 METERED DOSES) 220 MCG/INH inhaler   Inhalation   Inhale 2 puffs into the lungs daily.           . Multiple Vitamins-Minerals (MEGA MULTIVITAMIN FOR MEN PO)   Oral   Take by mouth daily.           . naphazoline (CLEAR EYES) 0.012 % ophthalmic solution   Both Eyes   Place 1 drop into both eyes as needed. Red eyes          . PLAQUENIL 200 MG tablet   Oral   Take 1 tablet by mouth Daily.         . predniSONE (DELTASONE) 10 MG tablet      40 mg daily x 2 days, then 20mg  daily x 2 days, then 10mg  daily x 2 days, then 5mg  daily x 2 days and stop   18 tablet   0   . primidone (MYSOLINE) 50 MG tablet      One po qday xone week, then one bid xone week, then one tid.   90 tablet  6    . simvastatin (ZOCOR) 20 MG tablet   Oral   Take 5 mg by mouth at bedtime.          . Tamsulosin HCl (FLOMAX) 0.4 MG CAPS   Oral   Take by mouth at bedtime.           [name] tiotropium (SPIRIVA HANDIHALER) 18 MCG inhalation capsule   Inhalation   Place 18 mcg into inhaler and inhale daily.            BP 154/72  Pulse 83  Temp(Src) 98.5 F (36.9 C) (Oral)  Resp 22  Ht 6\' 3"  (1.905 m)  Wt 240 lb (108.863 kg)  BMI 30.00 kg/m2  SpO2 94% Physical Exam  Nursing note and vitals reviewed. Constitutional: He is oriented to person, place, and time. He appears well-developed and well-nourished.  HENT:  Head: Normocephalic and atraumatic.  Right Ear: External ear normal.  Left Ear: External ear normal.  Nose: Nose normal.  Mouth/Throat: Oropharynx is clear and moist. No oropharyngeal exudate.  Eyes: Conjunctivae and EOM are normal. Pupils are equal, round, and reactive to light.  Neck: Normal range of motion. Neck supple.  Cardiovascular: Normal rate, regular rhythm, normal heart sounds and intact distal pulses.  Exam reveals no gallop and no friction rub.   No murmur heard. Pulmonary/Chest: Effort normal and breath sounds normal. No respiratory distress. He has no wheezes.  Abdominal: Soft. Bowel sounds are normal. He exhibits no distension. There is no tenderness. There is no rebound and no guarding.  Musculoskeletal: Normal range of motion. He exhibits no edema and no tenderness.  No focal tenderness of the back.  Neurological: He is alert and oriented to person, place, and time.  Skin: Skin is warm and dry.  Psychiatric: He has a normal mood and affect. His behavior is normal.    ED Course  Procedures (including critical care time) Labs Review Labs Reviewed  CBC WITH DIFFERENTIAL  COMPREHENSIVE METABOLIC PANEL  TROPONIN I   Imaging Review Dg Chest 2 View  07/19/2013   CLINICAL DATA:  COPD exacerbation with shortness of breath and right posterior low chest pain.  Recent diagnosis of right lung pneumonia.  EXAM: CHEST  2 VIEW  COMPARISON:  07/14/2013, 10/17/2011, 02/16/2011.  FINDINGS: Cardiac silhouette upper normal in size to slightly enlarged for the degree of COPD. Prominent central pulmonary arteries, unchanged. Hilar and mediastinal contours otherwise unremarkable. Pleuroparenchymal scarring at the right base with blunting of the right costophrenic angle, unchanged. Right apical pleuroparenchymal scarring, unchanged. Emphysematous changes in the left upper lobe, unchanged. The patchy airspace opacities throughout the right lung identified on the examination 5 days ago have shown interval improvement, but are not yet back to the baseline appearance. No new pulmonary parenchymal abnormalities. Visualized bony thorax intact.  IMPRESSION: Improving pneumonia in the right lung superimposed upon COPD/emphysema. No new abnormality since the examination 5 days ago.   Electronically Signed   By: Hulan Saas M.D.   On: 07/19/2013 16:32    EKG Interpretation    Date/Time:  Tuesday July 19 2013 17:21:38 EST Ventricular Rate:  73 PR Interval:  166 QRS Duration: 142 QT Interval:  412 QTC Calculation: 453 R Axis:   -50 Text Interpretation:  Normal sinus rhythm Left axis deviation Left bundle branch block No significant change since last tracing Confirmed by Anysha Frappier  MD, Issai Werling ([id number]) on 07/19/2013 5:35:11 PM            MDM   1.  Community acquired pneumonia   2. Back pain    4:49 PM 72 y.o. male who was diagnosed with pneumonia approximately 6 days ago who presents with right lower back pain. The patient has been on azithromycin and prednisone. He called his pulmonary doctor today who switched him to Levaquin and continued the prednisone. He states that he began having right lower back pain yesterday which has persisted. He also notes some chest heaviness last night. He denies any shortness of breath on exam and is currently on 3 L via nasal  cannula which is his baseline. His chest x-ray here today shows a resolving right lower lobe pneumonia. He appears well on exam. He has a history of abdominal aneurysm repair with graft. Will get lab work, Percocet for pain, and CT imaging to evaluate aorta.  8:38 PM: No acute abnormality on imaging. Pt's pain improved w/ percocet. His pain is possibly referred from the [facility] pna. Will provide Rx for percocet and have pt continue abx/prednisone as instructed by his pulmonologist. Family is happy w/ plan.  I have discussed the diagnosis/risks/treatment options with the patient and family and believe the pt to be eligible for discharge home to follow-up with pcp as needed. We also discussed returning to the ED immediately if new or worsening sx occur. We discussed the sx which are most concerning (e.g., worsening pain, fever, sob, cp) that necessitate immediate return. Any new prescriptions provided to the patient are listed below.  New Prescriptions   OXYCODONE-ACETAMINOPHEN (PERCOCET) 5-325 MG PER TABLET    Take 1 tablet by mouth every 6 (six) hours as needed for moderate pain.     Junius Argyle, MD 07/20/13 1131

## 2013-07-19 NOTE — Telephone Encounter (Signed)
From OV 07/14/13 w/ MW: Patient Instructions      zpak take as directed Prednisone 10 mg take  4 each am x 2 days,   2 each am x 2 days,  1 each am x 2 days and stop  For cough > mucinex dm 1200 mg every 12 hours as needed For breathing > ok to use albuterol up to every 4 hours if needed  Please remember to go to the x-ray department downstairs for your tests - we will call you with the results when they are available  ---  I called and spoke with pt. He reports he is coughing up green-grey phelm, chest is tight, hard to take a deep breathe. He has finished ABX and has 2 days of the prednisone left. He is requesting something stronger. Please advise MR thanks  No Known Allergies

## 2013-07-19 NOTE — ED Notes (Signed)
C/o right mid/ lower back started yesterday-being treated for right lung pneumonia-pt is on O2 3LNC

## 2013-08-02 ENCOUNTER — Ambulatory Visit (INDEPENDENT_AMBULATORY_CARE_PROVIDER_SITE_OTHER): Payer: Medicare Other | Admitting: Internal Medicine

## 2013-08-02 ENCOUNTER — Encounter: Payer: Self-pay | Admitting: Internal Medicine

## 2013-08-02 VITALS — BP 138/76 | HR 57 | Ht 75.0 in | Wt 249.2 lb

## 2013-08-02 DIAGNOSIS — J4489 Other specified chronic obstructive pulmonary disease: Secondary | ICD-10-CM

## 2013-08-02 DIAGNOSIS — J449 Chronic obstructive pulmonary disease, unspecified: Secondary | ICD-10-CM

## 2013-08-02 NOTE — Assessment & Plan Note (Signed)
Glad COPD is stable Continue your medications We will screen you for [employer] study GaLATHEA  - will keep you posted next few weeks REturn to see me in 3 months

## 2013-08-02 NOTE — Patient Instructions (Signed)
Glad COPD is stable Continue your medications We will screen you for [employer] Research study GaLATHEA  - will keep you posted next few weeks REturn to see me in 3 months 

## 2013-08-02 NOTE — Progress Notes (Signed)
Subjective:    Patient ID: [name], male    DOB: 1941/02/09, 73 y.o.   MRN: [id number]  HPI   11 yowm quit smoking around 2000 with GOLD III COPD on 3-4 L o2. Meds through [facility]  He was hospitalised in 10/12 for acute on chronic hypercarbic resp failure. He also has gr 2 diastolic dysfunction & improved with diuresis . He was started on VPAP S auto on discharge [facility])  PSG 10/15 showed severe OSA, AHI 48/h with nadir desatn 78% correctd by CPAP 12 cm, 3 L O2 blended in , c flex +2 cm, humidity, A medium full face mask was used.  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 Desaturations persisted without resp events on CPAP 12 cm   Download 11/19-12/11/12 shows good control of events on CPAP 16 cm, good usage   08/31/2012  57m FU  Underwent Lung transplant eval - he finally decided to come off the list CPAP was at 12 cm but increased to 16 cm at his request in the past 7/13 >> Dropped pressure to 14 cm , Increase C flex to +3 On 3 L at rest  & sleep, pressure ok, mask OK Neurontin bid - around 7pm Monitor wts daily & takes lasix for increase in 2lbs or more  Breathing is unchanged. Still using CPAP every night for 8-9 hours. Denies chest pain, chest tightness, SOB or coughing.   OV 09/27/2012 This is returns for routine followup of COPD with some underlying pulmonary fibrosis but dominant component being COPD. Next issue is, obesity  In terms of obesity he has not lost any weightIN terms of weight: struggling with diet due to lack of participation from wife. HE says he will go back to diet and gym.   Estimated body mass index is 31.42 kg/(m^2) as calculated from the following:   Height as of this encounter: 6\' 3"  (1.905 m).   Weight as of this encounter: 251 lb 6.4 oz (114.034 kg).  In terms of COPD, Came off ttransplant list because he has copd more dominant than pulmonary fibrosis. They have advised him that fev1  needs to drop < 30% for transplant. This was too much for him and all social logisitics and with age and with wife with copd and prognosis of transplant. So, he had family discussion and decided against transplant.    07/14/2013 f/u ov/Wert re:  Chief Complaint  Patient presents with  . Acute Visit    Pt c/o cough- prod with bloody sputum x 2 days. Also c/o slight increase in SOB and chills.   was doing fine on 24/7 on 2 at rest, 4-5 with heavy ex like at the gym then started with yellow mucus then turned slt bloody, low grade fever, contact with sick grandchild  Doesn't use saba hfa or neb at baseline and only using at most twice daily saba hfa since onset of cough  No obvious day to day or daytime variabilty or assoc   cp or chest tightness, subjective wheeze overt sinus or hb symptoms. No unusual exp hx or h/o childhood pna/ asthma or knowledge of premature birth.  Sleeping ok without nocturnal  or early am exacerbation  of respiratory  c/o's or need for noct saba. Also denies any obvious fluctuation of symptoms with weather or environmental changes or other aggravating or alleviating factors except as outlined above    REC Zpak Pred  O V 08/02/2013  Chief Complaint  Patient presents with  . COPD    follow-up. Pt states symptoms are improved from visit with MW on 07-14-13.    REcent AECOPD dec 2014. Now well. COPD Meds: NAC + Spiriva + ASmanex  + foradil (all fre through [facility])  + 3L o2 at rest  COPD CAT socre is 8 S(ee below)(. He is working out aggressively at gym and self cranks O2 very high to 8L o2 without checking; gave him guidelines. No new issues. Wife has bad copd and is considering stent trial at [facility]   CAT COPD Symptom & Quality of Life Score ([employer] trademark) 0 is no burden. 5 is highest burden 08/02/2013   Never Cough -> Cough all the time 1  No phlegm in chest -> Chest is full of phlegm 1  No chest tightness -> Chest feels very tight 0  No dyspnea for 1 flight  stairs/hill -> Very dyspneic for 1 flight of stairs 3  No limitations for ADL at home -> Very limited with ADL at home 0  Confident leaving home -> Not at all confident leaving home 0  Sleep soundly -> Do not sleep soundly because of lung condition 0  Lots of Energy -> No energy at all 3  TOTAL Score (max 40)  8      Review of Systems  Constitutional: Negative for fever and unexpected weight change.  HENT: Positive for rhinorrhea. Negative for congestion, dental problem, ear pain, nosebleeds, postnasal drip, sinus pressure, sneezing, sore throat and trouble swallowing.   Eyes: Negative for redness and itching.  Respiratory: Positive for shortness of breath. Negative for cough, chest tightness and wheezing.   Cardiovascular: Negative for palpitations and leg swelling.  Gastrointestinal: Negative for nausea and vomiting.  Genitourinary: Negative for dysuria.  Musculoskeletal: Negative for joint swelling.  Skin: Negative for rash.  Neurological: Negative for headaches.  Hematological: Does not bruise/bleed easily.  Psychiatric/Behavioral: Negative for dysphoric mood. The patient is not nervous/anxious.        Objective:   Physical Exam  Nursing note and vitals reviewed. Constitutional: He is oriented to person, place, and time. He appears well-developed and well-nourished. No distress.  HENT:  Head: Normocephalic and atraumatic.  Right Ear: External ear normal.  Left Ear: External ear normal.  Mouth/Throat: Oropharynx is clear and moist. No oropharyngeal exudate.  Eyes: Conjunctivae and EOM are normal. Pupils are equal, round, and reactive to light. Right eye exhibits no discharge. Left eye exhibits no discharge. No scleral icterus.  Neck: Normal range of motion. Neck supple. No JVD present. No tracheal deviation present. No thyromegaly present.  Cardiovascular: Normal rate, regular rhythm and intact distal pulses.  Exam reveals no gallop and no friction rub.   No murmur  heard. Pulmonary/Chest: Effort normal and breath sounds normal. No respiratory distress. He has no wheezes. He has no rales. He exhibits no tenderness.  barrell chest  Abdominal: Soft. Bowel sounds are normal. He exhibits no distension and no mass. There is no tenderness. There is no rebound and no guarding.  Musculoskeletal: Normal range of motion. He exhibits no edema and no tenderness.  Lymphadenopathy:    He has no cervical adenopathy.  Neurological: He is alert and oriented to person, place, and time. He has normal reflexes. No cranial nerve deficit. Coordination normal.  Skin: Skin is warm and dry. No rash noted. He is not diaphoretic. No erythema. No pallor.  Psychiatric: He has a normal mood and affect. His behavior is normal. Judgment  and thought content normal.    Filed Vitals:   08/02/13 1056  BP: 138/76  Pulse: 57  Height: 6\' 3"  (1.905 m)  Weight: 249 lb 3.2 oz (113.036 kg)  SpO2: 91%          Assessment & Plan:

## 2013-08-22 ENCOUNTER — Emergency Department (HOSPITAL_BASED_OUTPATIENT_CLINIC_OR_DEPARTMENT_OTHER): Payer: Medicare Other

## 2013-08-22 ENCOUNTER — Encounter (HOSPITAL_BASED_OUTPATIENT_CLINIC_OR_DEPARTMENT_OTHER): Payer: Self-pay | Admitting: Emergency Medicine

## 2013-08-22 ENCOUNTER — Inpatient Hospital Stay (HOSPITAL_BASED_OUTPATIENT_CLINIC_OR_DEPARTMENT_OTHER)
Admission: EM | Admit: 2013-08-22 | Discharge: 2013-08-24 | DRG: 389 | Disposition: A | Discharge status: Home / Self Care | Payer: Medicare Other | Attending: Internal Medicine | Admitting: Internal Medicine

## 2013-08-22 ENCOUNTER — Other Ambulatory Visit: Payer: Self-pay

## 2013-08-22 DIAGNOSIS — N400 Enlarged prostate without lower urinary tract symptoms: Secondary | ICD-10-CM | POA: Diagnosis present

## 2013-08-22 DIAGNOSIS — K567 Ileus, unspecified: Secondary | ICD-10-CM

## 2013-08-22 DIAGNOSIS — J449 Chronic obstructive pulmonary disease, unspecified: Secondary | ICD-10-CM | POA: Diagnosis present

## 2013-08-22 DIAGNOSIS — H9190 Unspecified hearing loss, unspecified ear: Secondary | ICD-10-CM | POA: Diagnosis present

## 2013-08-22 DIAGNOSIS — F3289 Other specified depressive episodes: Secondary | ICD-10-CM | POA: Diagnosis present

## 2013-08-22 DIAGNOSIS — M1990 Unspecified osteoarthritis, unspecified site: Secondary | ICD-10-CM | POA: Diagnosis present

## 2013-08-22 DIAGNOSIS — K5900 Constipation, unspecified: Secondary | ICD-10-CM | POA: Diagnosis present

## 2013-08-22 DIAGNOSIS — K560 Paralytic ileus: Principal | ICD-10-CM | POA: Diagnosis present

## 2013-08-22 DIAGNOSIS — J9610 Chronic respiratory failure, unspecified whether with hypoxia or hypercapnia: Secondary | ICD-10-CM | POA: Diagnosis present

## 2013-08-22 DIAGNOSIS — Z79899 Other long term (current) drug therapy: Secondary | ICD-10-CM

## 2013-08-22 DIAGNOSIS — F329 Major depressive disorder, single episode, unspecified: Secondary | ICD-10-CM | POA: Diagnosis present

## 2013-08-22 DIAGNOSIS — Z9981 Dependence on supplemental oxygen: Secondary | ICD-10-CM

## 2013-08-22 DIAGNOSIS — R259 Unspecified abnormal involuntary movements: Secondary | ICD-10-CM

## 2013-08-22 DIAGNOSIS — Z8719 Personal history of other diseases of the digestive system: Secondary | ICD-10-CM

## 2013-08-22 DIAGNOSIS — M069 Rheumatoid arthritis, unspecified: Secondary | ICD-10-CM | POA: Diagnosis present

## 2013-08-22 DIAGNOSIS — R52 Pain, unspecified: Secondary | ICD-10-CM | POA: Diagnosis present

## 2013-08-22 DIAGNOSIS — I10 Essential (primary) hypertension: Secondary | ICD-10-CM | POA: Diagnosis present

## 2013-08-22 DIAGNOSIS — J8410 Pulmonary fibrosis, unspecified: Secondary | ICD-10-CM | POA: Diagnosis present

## 2013-08-22 DIAGNOSIS — I2510 Atherosclerotic heart disease of native coronary artery without angina pectoris: Secondary | ICD-10-CM | POA: Diagnosis present

## 2013-08-22 DIAGNOSIS — E039 Hypothyroidism, unspecified: Secondary | ICD-10-CM | POA: Diagnosis present

## 2013-08-22 DIAGNOSIS — R109 Unspecified abdominal pain: Secondary | ICD-10-CM | POA: Diagnosis present

## 2013-08-22 DIAGNOSIS — E119 Type 2 diabetes mellitus without complications: Secondary | ICD-10-CM | POA: Diagnosis present

## 2013-08-22 DIAGNOSIS — F4310 Post-traumatic stress disorder, unspecified: Secondary | ICD-10-CM | POA: Diagnosis present

## 2013-08-22 DIAGNOSIS — Z87891 Personal history of nicotine dependence: Secondary | ICD-10-CM

## 2013-08-22 DIAGNOSIS — J4489 Other specified chronic obstructive pulmonary disease: Secondary | ICD-10-CM | POA: Diagnosis present

## 2013-08-22 DIAGNOSIS — Z794 Long term (current) use of insulin: Secondary | ICD-10-CM

## 2013-08-22 DIAGNOSIS — R269 Unspecified abnormalities of gait and mobility: Secondary | ICD-10-CM

## 2013-08-22 LAB — CBC WITH DIFFERENTIAL/PLATELET
Basophils Absolute: 0 10*3/uL (ref 0.0–0.1)
Basophils Relative: 0 % (ref 0–1)
Eosinophils Absolute: 0.2 10*3/uL (ref 0.0–0.7)
Eosinophils Relative: 2 % (ref 0–5)
HEMATOCRIT: 40.7 % (ref 39.0–52.0)
Hemoglobin: 13 g/dL (ref 13.0–17.0)
LYMPHS PCT: 20 % (ref 12–46)
Lymphs Abs: 1.9 10*3/uL (ref 0.7–4.0)
MCH: 27.1 pg (ref 26.0–34.0)
MCHC: 31.9 g/dL (ref 30.0–36.0)
MCV: 84.8 fL (ref 78.0–100.0)
MONOS PCT: 9 % (ref 3–12)
Monocytes Absolute: 0.8 10*3/uL (ref 0.1–1.0)
NEUTROS ABS: 6.4 10*3/uL (ref 1.7–7.7)
Neutrophils Relative %: 69 % (ref 43–77)
Platelets: 146 10*3/uL — ABNORMAL LOW (ref 150–400)
RBC: 4.8 MIL/uL (ref 4.22–5.81)
RDW: 15 % (ref 11.5–15.5)
WBC: 9.3 10*3/uL (ref 4.0–10.5)

## 2013-08-22 LAB — COMPREHENSIVE METABOLIC PANEL
ALK PHOS: 73 U/L (ref 39–117)
ALT: 24 U/L (ref 0–53)
AST: 21 U/L (ref 0–37)
Albumin: 4.3 g/dL (ref 3.5–5.2)
BILIRUBIN TOTAL: 0.6 mg/dL (ref 0.3–1.2)
BUN: 20 mg/dL (ref 6–23)
CHLORIDE: 102 meq/L (ref 96–112)
CO2: 34 meq/L — AB (ref 19–32)
CREATININE: 1 mg/dL (ref 0.50–1.35)
Calcium: 10.6 mg/dL — ABNORMAL HIGH (ref 8.4–10.5)
GFR calc Af Amer: 85 mL/min — ABNORMAL LOW (ref >90)
GFR, EST NON AFRICAN AMERICAN: 74 mL/min — AB (ref >90)
Glucose, Bld: 78 mg/dL (ref 70–99)
Potassium: 4.4 mEq/L (ref 3.7–5.3)
Sodium: 147 mEq/L (ref 137–147)
Total Protein: 7.4 g/dL (ref 6.0–8.3)

## 2013-08-22 LAB — URINALYSIS, ROUTINE W REFLEX MICROSCOPIC
Glucose, UA: 250 mg/dL — AB
Hgb urine dipstick: NEGATIVE
KETONES UR: 15 mg/dL — AB
LEUKOCYTES UA: NEGATIVE
NITRITE: NEGATIVE
PROTEIN: NEGATIVE mg/dL
Specific Gravity, Urine: 1.027 (ref 1.005–1.030)
Urobilinogen, UA: 1 mg/dL (ref 0.0–1.0)
pH: 6 (ref 5.0–8.0)

## 2013-08-22 LAB — CBC
HEMATOCRIT: 39.2 % (ref 39.0–52.0)
Hemoglobin: 12.6 g/dL — ABNORMAL LOW (ref 13.0–17.0)
MCH: 26.9 pg (ref 26.0–34.0)
MCHC: 32.1 g/dL (ref 30.0–36.0)
MCV: 83.8 fL (ref 78.0–100.0)
PLATELETS: 135 10*3/uL — AB (ref 150–400)
RBC: 4.68 MIL/uL (ref 4.22–5.81)
RDW: 15 % (ref 11.5–15.5)
WBC: 8.1 10*3/uL (ref 4.0–10.5)

## 2013-08-22 LAB — GLUCOSE, CAPILLARY
GLUCOSE-CAPILLARY: 63 mg/dL — AB (ref 70–99)
GLUCOSE-CAPILLARY: 130 mg/dL — AB (ref 70–99)
Glucose-Capillary: 90 mg/dL (ref 70–99)
Glucose-Capillary: 80 mg/dL (ref 70–99)

## 2013-08-22 LAB — CREATININE, SERUM
Creatinine, Ser: 0.86 mg/dL (ref 0.50–1.35)
GFR calc non Af Amer: 85 mL/min — ABNORMAL LOW (ref >90)

## 2013-08-22 LAB — LIPASE, BLOOD: LIPASE: 42 U/L (ref 11–59)

## 2013-08-22 MED ORDER — TAMSULOSIN HCL 0.4 MG PO CAPS
0.4000 mg | ORAL_CAPSULE | Freq: Every day | ORAL | Status: DC
  Administered 2013-08-22 – 2013-08-23 (×2): 0.4 mg via ORAL
  Filled 2013-08-22 (×3): qty 1

## 2013-08-22 MED ORDER — DEXTROSE 50 % IV SOLN
25.0000 mL | Freq: Once | INTRAVENOUS | Status: AC
  Administered 2013-08-22: 25 mL via INTRAVENOUS
  Filled 2013-08-22: qty 50

## 2013-08-22 MED ORDER — FLUTICASONE PROPIONATE HFA 44 MCG/ACT IN AERO
2.0000 | INHALATION_SPRAY | Freq: Two times a day (BID) | RESPIRATORY_TRACT | Status: DC
  Administered 2013-08-22 – 2013-08-24 (×4): 2 via RESPIRATORY_TRACT
  Filled 2013-08-22: qty 10.6

## 2013-08-22 MED ORDER — IPRATROPIUM BROMIDE 0.02 % IN SOLN
0.5000 mg | Freq: Four times a day (QID) | RESPIRATORY_TRACT | Status: DC
  Administered 2013-08-22: 0.5 mg via RESPIRATORY_TRACT
  Filled 2013-08-22: qty 2.5

## 2013-08-22 MED ORDER — ALBUTEROL SULFATE (2.5 MG/3ML) 0.083% IN NEBU: 2.5000 mg | INHALATION_SOLUTION | RESPIRATORY_TRACT | Status: DC | PRN

## 2013-08-22 MED ORDER — SALMETEROL XINAFOATE 50 MCG/DOSE IN AEPB
1.0000 | INHALATION_SPRAY | Freq: Two times a day (BID) | RESPIRATORY_TRACT | Status: DC
  Administered 2013-08-22 – 2013-08-24 (×4): 1 via RESPIRATORY_TRACT
  Filled 2013-08-22: qty 0

## 2013-08-22 MED ORDER — NAPHAZOLINE HCL 0.012 % OP SOLN: 1.0000 [drp] | OPHTHALMIC | Status: DC | PRN

## 2013-08-22 MED ORDER — LEVOTHYROXINE SODIUM 25 MCG PO TABS
25.0000 ug | ORAL_TABLET | Freq: Every day | ORAL | Status: DC
  Administered 2013-08-23 – 2013-08-24 (×2): 25 ug via ORAL
  Filled 2013-08-22 (×3): qty 1

## 2013-08-22 MED ORDER — ALBUTEROL SULFATE (2.5 MG/3ML) 0.083% IN NEBU: 2.5000 mg | INHALATION_SOLUTION | Freq: Four times a day (QID) | RESPIRATORY_TRACT | Status: DC | PRN

## 2013-08-22 MED ORDER — ACETAMINOPHEN 325 MG PO TABS: 650.0000 mg | ORAL_TABLET | Freq: Four times a day (QID) | ORAL | Status: DC | PRN

## 2013-08-22 MED ORDER — IOHEXOL 300 MG/ML  SOLN
50.0000 mL | Freq: Once | INTRAMUSCULAR | Status: AC | PRN
  Administered 2013-08-22: 50 mL via ORAL

## 2013-08-22 MED ORDER — POLYVINYL ALCOHOL 1.4 % OP SOLN
1.0000 [drp] | OPHTHALMIC | Status: DC | PRN
  Filled 2013-08-22: qty 15

## 2013-08-22 MED ORDER — SODIUM CHLORIDE 0.9 % IV BOLUS (SEPSIS)
1000.0000 mL | Freq: Once | INTRAVENOUS | Status: AC
  Administered 2013-08-22: 1000 mL via INTRAVENOUS

## 2013-08-22 MED ORDER — ONDANSETRON HCL 4 MG/2ML IJ SOLN
4.0000 mg | Freq: Once | INTRAMUSCULAR | Status: AC
  Administered 2013-08-22: 4 mg via INTRAVENOUS
  Filled 2013-08-22: qty 2

## 2013-08-22 MED ORDER — IOHEXOL 300 MG/ML  SOLN
100.0000 mL | Freq: Once | INTRAMUSCULAR | Status: AC | PRN
  Administered 2013-08-22: 100 mL via INTRAVENOUS

## 2013-08-22 MED ORDER — ONDANSETRON HCL 4 MG/2ML IJ SOLN: 4.0000 mg | Freq: Four times a day (QID) | INTRAMUSCULAR | Status: DC | PRN

## 2013-08-22 MED ORDER — ALBUTEROL SULFATE (2.5 MG/3ML) 0.083% IN NEBU
2.5000 mg | INHALATION_SOLUTION | Freq: Four times a day (QID) | RESPIRATORY_TRACT | Status: DC
  Administered 2013-08-22: 2.5 mg via RESPIRATORY_TRACT
  Filled 2013-08-22: qty 3

## 2013-08-22 MED ORDER — INSULIN ASPART 100 UNIT/ML ~~LOC~~ SOLN
0.0000 [IU] | Freq: Three times a day (TID) | SUBCUTANEOUS | Status: DC
Start: 2013-08-23 — End: 2013-08-24
  Administered 2013-08-23: 1 [IU] via SUBCUTANEOUS
  Administered 2013-08-24: 2 [IU] via SUBCUTANEOUS

## 2013-08-22 MED ORDER — PANTOPRAZOLE SODIUM 40 MG IV SOLR
40.0000 mg | Freq: Two times a day (BID) | INTRAVENOUS | Status: DC
  Administered 2013-08-22 – 2013-08-23 (×2): 40 mg via INTRAVENOUS
  Filled 2013-08-22 (×3): qty 40

## 2013-08-22 MED ORDER — IPRATROPIUM-ALBUTEROL 0.5-2.5 (3) MG/3ML IN SOLN: 3.0000 mL | Freq: Four times a day (QID) | RESPIRATORY_TRACT | Status: DC

## 2013-08-22 MED ORDER — ONDANSETRON HCL 4 MG PO TABS: 4.0000 mg | ORAL_TABLET | Freq: Four times a day (QID) | ORAL | Status: DC | PRN

## 2013-08-22 MED ORDER — MORPHINE SULFATE 4 MG/ML IJ SOLN
4.0000 mg | Freq: Once | INTRAMUSCULAR | Status: AC
  Administered 2013-08-22: 4 mg via INTRAVENOUS
  Filled 2013-08-22: qty 1

## 2013-08-22 MED ORDER — ALBUTEROL SULFATE HFA 108 (90 BASE) MCG/ACT IN AERS: 2.0000 | INHALATION_SPRAY | RESPIRATORY_TRACT | Status: DC | PRN

## 2013-08-22 MED ORDER — GABAPENTIN 800 MG PO TABS
800.0000 mg | ORAL_TABLET | Freq: Two times a day (BID) | ORAL | Status: DC
  Filled 2013-08-22: qty 1

## 2013-08-22 MED ORDER — DEXTROSE-NACL 5-0.9 % IV SOLN: Freq: Once | INTRAVENOUS | Status: DC

## 2013-08-22 MED ORDER — SENNOSIDES-DOCUSATE SODIUM 8.6-50 MG PO TABS: 1.0000 | ORAL_TABLET | Freq: Every evening | ORAL | Status: DC | PRN

## 2013-08-22 MED ORDER — ENOXAPARIN SODIUM 40 MG/0.4ML ~~LOC~~ SOLN
40.0000 mg | SUBCUTANEOUS | Status: DC
  Administered 2013-08-22 – 2013-08-23 (×2): 40 mg via SUBCUTANEOUS
  Filled 2013-08-22 (×3): qty 0.4

## 2013-08-22 MED ORDER — MORPHINE SULFATE 2 MG/ML IJ SOLN: 2.0000 mg | INTRAMUSCULAR | Status: DC | PRN

## 2013-08-22 MED ORDER — ONDANSETRON HCL 4 MG/2ML IJ SOLN: 4.0000 mg | Freq: Three times a day (TID) | INTRAMUSCULAR | Status: DC | PRN

## 2013-08-22 MED ORDER — SODIUM CHLORIDE 0.9 % IV SOLN: INTRAVENOUS | Status: DC

## 2013-08-22 MED ORDER — GABAPENTIN 400 MG PO CAPS
800.0000 mg | ORAL_CAPSULE | Freq: Two times a day (BID) | ORAL | Status: DC
  Administered 2013-08-22 – 2013-08-24 (×4): 800 mg via ORAL
  Filled 2013-08-22 (×5): qty 2

## 2013-08-22 MED ORDER — ACETAMINOPHEN 650 MG RE SUPP: 650.0000 mg | Freq: Four times a day (QID) | RECTAL | Status: DC | PRN

## 2013-08-22 MED ORDER — DEXTROSE-NACL 5-0.45 % IV SOLN
INTRAVENOUS | Status: DC
  Administered 2013-08-22: 13:00:00 via INTRAVENOUS

## 2013-08-22 MED ORDER — OXYCODONE HCL 5 MG PO TABS: 5.0000 mg | ORAL_TABLET | ORAL | Status: DC | PRN

## 2013-08-22 MED ORDER — HYDROXYCHLOROQUINE SULFATE 200 MG PO TABS
200.0000 mg | ORAL_TABLET | Freq: Every day | ORAL | Status: DC
  Administered 2013-08-22 – 2013-08-24 (×3): 200 mg via ORAL
  Filled 2013-08-22 (×3): qty 1

## 2013-08-22 MED ORDER — ZOLPIDEM TARTRATE 5 MG PO TABS: 5.0000 mg | ORAL_TABLET | Freq: Every evening | ORAL | Status: DC | PRN

## 2013-08-22 MED ORDER — DOCUSATE SODIUM 100 MG PO CAPS
100.0000 mg | ORAL_CAPSULE | Freq: Two times a day (BID) | ORAL | Status: DC
  Administered 2013-08-22 – 2013-08-23 (×2): 100 mg via ORAL
  Filled 2013-08-22 (×3): qty 1

## 2013-08-22 MED ORDER — TIOTROPIUM BROMIDE MONOHYDRATE 18 MCG IN CAPS
18.0000 ug | ORAL_CAPSULE | Freq: Every day | RESPIRATORY_TRACT | Status: DC
  Administered 2013-08-23 – 2013-08-24 (×2): 18 ug via RESPIRATORY_TRACT
  Filled 2013-08-22: qty 5

## 2013-08-22 NOTE — Progress Notes (Signed)
Pt. Has his home CPAP set up at the bedside with a FFM. CPAP was checked for frayed wires & none were found. [facility] was called to check CPAP & they stated they would come in the AM. Pt. Stated that he would place himself on CPAP before going to bed. Pt. Was made aware to call RT if he needed assistance with CPAP.

## 2013-08-22 NOTE — ED Notes (Signed)
C/o of abd pain that started last night. Took tums without relief. Also took maalox twice and this did not help as well. C/o pain to upper left abd pain. States pain comes as goes. Describes as a tightness. Denies any diarrhea. Denies fevers. Hx of abd surgeries in the past.

## 2013-08-22 NOTE — ED Notes (Addendum)
CBG 63.  Ninetta Lights, RN notified.

## 2013-08-22 NOTE — ED Notes (Signed)
Pt in CT.

## 2013-08-22 NOTE — H&P (Signed)
[facility]                                                                                    Patient Demographics  [name], is a 73 y.o. male  CSN: [id number]  MRN: [id number]  DOB - February 13, 1941  Admit Date - 08/22/2013  Outpatient Primary MD for the patient is [name], MD   With History of -  Past Medical History  Diagnosis Date  . THRUSH 11/06/2009  . HYPOTHYROIDISM 07/30/2009  . DIABETES MELLITUS, TYPE II 11/23/2009  . DEPRESSION 11/23/2009  . 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 HYPERTENSION 07/30/2009  . CORONARY ARTERY DISEASE 11/23/2009  . CHRONIC OBSTRUCTIVE PULMONARY DISEASE, ACUTE EXACERBATION 03/01/2010  . C O P D 07/30/2009  . 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 RESPIRATORY FAILURE, CHRONIC 07/31/2009  . BENIGN PROSTATIC HYPERTROPHY 11/23/2009  . DEGENERATIVE JOINT DISEASE 11/23/2009  . 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 FATIGUE 11/23/2009  . 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 GAIT DISTURBANCE 12/10/2009  . DYSPNEA/SHORTNESS OF BREATH 12/08/2009  . HEMOPTYSIS UNSPECIFIED 05/07/2010  . RA (rheumatoid arthritis) 06/11/2011  . PTSD (post-traumatic stress disorder) 03/10/2012      Past Surgical History  Procedure Laterality Date  . Small bowel obstruction repair with adhesiolysis    . Hx of remote ileum resection due to bleeding    . Abdominal aortic aneurysm repair    . S/p back surgury   1972    lumbar laminectomy Dr. Fannie Knee  . S/p left femoral embolectomy with left leg ischemia      Dr. Hart Rochester, vascular  . S/p right ganglion cyst      Dr. Teressa Senter    in for   Chief Complaint  Patient presents with  . Abdominal Pain     HPI  [name]  is a 73 y.o. male, with past medical history significant for small bowel obstruction in the past status post ileum resection due to bleeding, COPD and coronary artery disease presenting today with 2 days history of generalized abdominal pain, mainly in the epigastric area. No history of nausea or vomiting. No  history of diarrhea. Patient had a bowel movement yesterday late and has been having flatus. CT of the abdomen showed a probable ileus but not conclusive. Patient is feeling better since yesterday and was admitted for further evaluation.    Review of Systems    In addition to the HPI above,  No Fever-chills, No Headache, No changes with Vision or hearing, No problems swallowing food or Liquids, No Chest pain, Cough or Shortness of Breath,  No Nausea or Vommitting, Bowel movements are regular, No Blood in stool or Urine, No dysuria, No new skin rashes or bruises, No new joints pains-aches,  No new weakness, tingling, numbness in any extremity, No recent weight gain or loss, No polyuria, polydypsia or polyphagia, No significant Mental Stressors.  A full 10 point Review of Systems was done, except as stated above, all other Review of Systems were negative.   Social History History  Substance Use Topics  . Smoking status: Former Smoker -- 2.50 packs/day for 40 years    Types: Cigarettes, Pipe, Cigars  Quit date: 07/28/1998  . Smokeless tobacco: Never Used     Comment: quit in 2000 x 40 yrs. 2.5 ppd  . Alcohol Use: No     Family History No family history on file.   Prior to Admission medications   Medication Sig Start Date End Date Taking? Authorizing Provider  Cholecalciferol (VITAMIN D3) 2000 UNITS TABS Take 1 tablet by mouth daily.    Yes Historical Provider, MD  formoterol (FORADIL AEROLIZER) 12 MCG capsule for inhaler Inhale contents of 1 capsule two times a day    Yes Historical Provider, MD  gabapentin (NEURONTIN) 800 MG tablet Take 800 mg by mouth 2 (two) times daily.     Yes Historical Provider, MD  insulin aspart (NOVOLOG) 100 UNIT/ML injection 8-10 Units See admin instructions. Sliding scale as needed with meals (8-10 units at supper).   Yes Historical Provider, MD  insulin glargine (LANTUS) 100 UNIT/ML injection Inject 70 Units into the skin at bedtime. Adjust  as needed 01/28/11  Yes Corwin Levins, MD  metFORMIN (GLUCOPHAGE) 1000 MG tablet Take 1,000 mg by mouth 2 (two) times daily.     Yes Historical Provider, MD  mometasone (ASMANEX 60 METERED DOSES) 220 MCG/INH inhaler Inhale 2 puffs into the lungs daily.     Yes Historical Provider, MD  Multiple Vitamins-Minerals (MEGA MULTIVITAMIN FOR MEN PO) Take by mouth daily.     Yes Historical Provider, MD  naphazoline (CLEAR EYES) 0.012 % ophthalmic solution Place 1 drop into both eyes as needed. Red eyes    Yes Historical Provider, MD  naproxen sodium (ANAPROX) 220 MG tablet Take 440 mg by mouth 2 (two) times daily with a meal.   Yes Historical Provider, MD  PLAQUENIL 200 MG tablet Take 1 tablet by mouth Daily. 01/17/11  Yes Historical Provider, MD  simvastatin (ZOCOR) 20 MG tablet Take 10 mg by mouth at bedtime.    Yes Historical Provider, MD  Tamsulosin HCl (FLOMAX) 0.4 MG CAPS Take by mouth at bedtime.     Yes Historical Provider, MD  tiotropium (SPIRIVA HANDIHALER) 18 MCG inhalation capsule Place 18 mcg into inhaler and inhale daily.     Yes Historical Provider, MD  albuterol (PROAIR HFA) 108 (90 BASE) MCG/ACT inhaler Inhale 2 puffs into the lungs every 4 (four) hours as needed. Shortness of breath 10/09/11   Kalman Shan, MD  albuterol (PROVENTIL) (2.5 MG/3ML) 0.083% nebulizer solution 1 vial in nebulizer every 6 hours as needed Dx 496 05/31/12   Kalman Shan, MD  levothyroxine (SYNTHROID, LEVOTHROID) 25 MCG tablet Take 25 mcg by mouth daily. 01/28/11 07/14/13  Corwin Levins, MD    No Known Allergies  Physical Exam  Vitals  Blood pressure 149/82, pulse 82, temperature 98 F (36.7 C), temperature source Oral, resp. rate 20, height 6\' 3"  (1.905 m), weight 113 kg (249 lb 1.9 oz), SpO2 92.00%.   1. General elderly white male, very pleasant in no significant distress at the moment  2. Normal affect and insight, Not Suicidal or Homicidal, Awake Alert, Oriented X 3.  3. No F.N deficits, ALL C.Nerves  Intact, Strength 5/5 all 4 extremities, Sensation intact all 4 extremities, Plantars down going.  4. Ears and Eyes appear Normal, Conjunctivae clear, PERRLA. Moist Oral Mucosa.  5. Supple Neck, No JVD, No cervical lymphadenopathy appriciated, No Carotid Bruits.  6. Symmetrical Chest wall movement, Good air movement bilaterally, CTAB.  7. RRR, No Gallops, Rubs or Murmurs, No Parasternal Heave.  8. decreased Bowel Sounds, abdomen; tender  to palpation, generalized but no surgical abdomen noted.  9.  No Cyanosis, Normal Skin Turgor, No Skin Rash or Bruise.  10. Good muscle tone,  joints appear normal , no effusions, Normal ROM.  11. No Palpable Lymph Nodes in Neck or Axillae    Data Review  CBC  Recent Labs Lab 08/22/13 0345  WBC 9.3  HGB 13.0  HCT 40.7  PLT 146*  MCV 84.8  MCH 27.1  MCHC 31.9  RDW 15.0  LYMPHSABS 1.9  MONOABS 0.8  EOSABS 0.2  BASOSABS 0.0   ------------------------------------------------------------------------------------------------------------------  Chemistries   Recent Labs Lab 08/22/13 0345  NA 147  K 4.4  CL 102  CO2 34*  GLUCOSE 78  BUN 20  CREATININE 1.00  CALCIUM 10.6*  AST 21  ALT 24  ALKPHOS 73  BILITOT 0.6      Imaging results:   Ct Abdomen Pelvis W Contrast  08/22/2013   CLINICAL DATA:  Upper left abdominal pain  EXAM: CT ABDOMEN AND PELVIS WITH CONTRAST  TECHNIQUE: Multidetector CT imaging of the abdomen and pelvis was performed using the standard protocol following bolus administration of intravenous contrast.  CONTRAST:  OMNIPAQUE IOHEXOL 300 MG/ML SOLN, 18mL OMNIPAQUE IOHEXOL 300 MG/ML SOLN  COMPARISON:  07/19/2013  FINDINGS: Right pleural thickening and associated calcifications may reflect sequelae of prior infection or pneumothorax. Coronary artery calcifications. Heart size upper normal. Emphysema and areas of air trapping. Atelectasis or scarring within the right greater than left lung bases.  No appreciable  abnormality of the liver, spleen, pancreas, adrenal glands. Gallstones. No biliary ductal dilatation.  Lower pole right renal cyst. Otherwise, symmetric renal enhancement. No hydroureteronephrosis.  No CT evidence for colitis. Colonic diverticulosis. Normal appendix. Small bowel anastomosis right lower quadrant with dilatation of the anastomosis loop, similar to prior. Dilated proximal small bowel loops up to 4.1 cm, with air-fluid levels. No free intraperitoneal air or fluid. No lymphadenopathy.  Scattered atherosclerosis of the aorta and branch vessels. Infrarenal ectasia. 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 See recent CTA report for detailed vascular evaluation.  Partially decompressed bladder. Small fat containing right inguinal hernia. Prostate gland measures 5 cm transverse diameter.  Multilevel degenerative changes.  No acute osseous finding.  IMPRESSION: Lung base opacities favor atelectasis and/or scarring. Background air trapping and emphysema.  Small bowel anastomosis right lower quadrant is similar to prior. Proximal small bowel loops are dilated up to 4.1 cm with no clear transition point. May reflect an ileus. Correlate with symptoms and a radiograph follow-up if warranted to document passage of ingested contrast into the colon.  Cholelithiasis.  No CT evidence for cholecystitis.  See recent CTA report regarding the aorta and branch vessels.   Electronically Signed   By: Jearld Lesch M.D.   On: 08/22/2013 06:37    My personal review of EKG: Normal sinus rhythm with left bundle branch block    Assessment & Plan  1. abdominal pain, mainly epigastric NOS; improving.     CT of the abdomen showed possible ileus     Amylase & lipase are negative     Stool guaiac is pending     History of ileal resection      2. History of COPD 3. History of diabetes mellitus 4. History of coronary artery disease  Plan  IV fluids D5 half-normal saline Pain  control Protonix IV Clear liquid diet Surgery on consult Hold long-acting insulin and hyperglycemia pills Check amylase and lipase again  in a.m.   DVT Prophylaxis Lovenox  AM Labs Ordered, also please review Full Orders  Family Communication: Admission, patients condition and plan of care including tests being ordered have been discussed with the patient  who indicates understanding and agrees with the plan and Code Status.  Code Status full  Disposition Plan: home  Time spent in minutes :   Condition GUARDED

## 2013-08-22 NOTE — ED Notes (Signed)
No other needs expressed at this time.

## 2013-08-22 NOTE — Progress Notes (Signed)
Notified flow manager for doctor assignment

## 2013-08-22 NOTE — ED Notes (Signed)
Patient states he is normally on 2 ltrs o2 by nasal cannula at home, and 3 ltrs. When out.

## 2013-08-22 NOTE — ED Provider Notes (Signed)
CSN: [id number]     Arrival date & time 08/22/13  0307 History   First MD Initiated Contact with Patient 08/22/13 [phone]     Chief Complaint  Patient presents with  . Abdominal Pain   (Consider location/radiation/quality/duration/timing/severity/associated sxs/prior Treatment) HPI Comments: Patient is a 73 year old male with history of COPD on home oxygen, AAA repair, surgery for adhesionolysis.  He presents today with complaints of epigastric abd pain and bloating and is concerned he is developing another blockage.  He denies vomiting.  No fevers or chills.  No urinary complaints.    Patient is a 73 y.o. male presenting with abdominal pain. The history is provided by the patient.  Abdominal Pain Pain location:  Epigastric Pain quality: cramping   Pain radiates to:  Does not radiate Pain severity:  Moderate Onset quality:  Sudden Duration:  8 hours Timing:  Constant Progression:  Worsening Chronicity:  New Relieved by:  Nothing Worsened by:  Movement and palpation Ineffective treatments:  None tried Associated symptoms: nausea   Associated symptoms: no constipation, no fever and no vomiting     Past Medical History  Diagnosis Date  . THRUSH 11/06/2009  . HYPOTHYROIDISM 07/30/2009  . DIABETES MELLITUS, TYPE II 11/23/2009  . DEPRESSION 11/23/2009  . 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 HYPERTENSION 07/30/2009  . CORONARY ARTERY DISEASE 11/23/2009  . CHRONIC OBSTRUCTIVE PULMONARY DISEASE, ACUTE EXACERBATION 03/01/2010  . C O P D 07/30/2009  . 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 RESPIRATORY FAILURE, CHRONIC 07/31/2009  . BENIGN PROSTATIC HYPERTROPHY 11/23/2009  . DEGENERATIVE JOINT DISEASE 11/23/2009  . 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 FATIGUE 11/23/2009  . 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 GAIT DISTURBANCE 12/10/2009  . DYSPNEA/SHORTNESS OF BREATH 12/08/2009  . HEMOPTYSIS UNSPECIFIED 05/07/2010  . RA (rheumatoid arthritis) 06/11/2011  . PTSD (post-traumatic stress disorder) 03/10/2012   Past  Surgical History  Procedure Laterality Date  . Small bowel obstruction repair with adhesiolysis    . Hx of remote ileum resection due to bleeding    . Abdominal aortic aneurysm repair    . S/p back surgury   1972    lumbar laminectomy Dr. Fannie Knee  . S/p left femoral embolectomy with left leg ischemia      Dr. Hart [place], vascular  . S/p right ganglion cyst      Dr. Teressa Senter   No family history on file. History  Substance Use Topics  . Smoking status: Former Smoker -- 2.50 packs/day for 40 years    Types: Cigarettes, Pipe, Cigars    Quit date: 07/28/1998  . Smokeless tobacco: Never Used     Comment: quit in 2000 x 40 yrs. 2.5 ppd  . Alcohol Use: No    Review of Systems  Constitutional: Negative for fever.  Gastrointestinal: Positive for nausea and abdominal pain. Negative for vomiting and constipation.  All other systems reviewed and are negative.    Allergies  Review of patient's allergies indicates no known allergies.  Home Medications   Current Outpatient Rx  Name  Route  Sig  Dispense  Refill  . Acetylcysteine (NAC) 600 MG CAPS   Oral   Take 1 capsule by mouth daily.         [name] albuterol (PROAIR HFA) 108 (90 BASE) MCG/ACT inhaler   Inhalation   Inhale 2 puffs into the lungs every 4 (four) hours as needed. Shortness of breath   1 Inhaler   6   . albuterol (PROVENTIL) (2.5 MG/3ML) 0.083% nebulizer solution  1 vial in nebulizer every 6 hours as needed Dx 496   120 mL   6   . B-D INS SYR ULTRAFINE 1CC/30G 30G X 1/2" 1 ML MISC               . Cholecalciferol (VITAMIN D3) 2000 UNITS TABS   Oral   Take 1 tablet by mouth daily.          . formoterol (FORADIL AEROLIZER) 12 MCG capsule for inhaler      Inhale contents of 1 capsule two times a day          . gabapentin (NEURONTIN) 800 MG tablet   Oral   Take 800 mg by mouth 2 (two) times daily.           . insulin aspart (NOVOLOG) 100 UNIT/ML injection      10-20 Units 3 (three) times daily before  meals. Sliding scale as needed with meals (14 units in the morning 12 units at lunch and 24 units at dinner)         . insulin glargine (LANTUS) 100 UNIT/ML injection   Subcutaneous   Inject 65 Units into the skin at bedtime. Adjust as needed         . EXPIRED: levothyroxine (SYNTHROID, LEVOTHROID) 25 MCG tablet   Oral   Take 25 mcg by mouth daily.         . metFORMIN (GLUCOPHAGE) 1000 MG tablet   Oral   Take 1,000 mg by mouth 2 (two) times daily.           . mometasone (ASMANEX 60 METERED DOSES) 220 MCG/INH inhaler   Inhalation   Inhale 2 puffs into the lungs daily.           . Multiple Vitamins-Minerals (MEGA MULTIVITAMIN FOR MEN PO)   Oral   Take by mouth daily.           . naphazoline (CLEAR EYES) 0.012 % ophthalmic solution   Both Eyes   Place 1 drop into both eyes as needed. Red eyes          . PLAQUENIL 200 MG tablet   Oral   Take 1 tablet by mouth Daily.         . primidone (MYSOLINE) 50 MG tablet      One po qday xone week, then one bid xone week, then one tid.   90 tablet   6   . simvastatin (ZOCOR) 20 MG tablet   Oral   Take 5 mg by mouth at bedtime.          . Tamsulosin HCl (FLOMAX) 0.4 MG CAPS   Oral   Take by mouth at bedtime.           [name] tiotropium (SPIRIVA HANDIHALER) 18 MCG inhalation capsule   Inhalation   Place 18 mcg into inhaler and inhale daily.            BP 137/54  Pulse 66  Temp(Src) 97.8 F (36.6 C) (Oral)  Resp 24  Ht 6\' 3"  (1.905 m)  Wt 246 lb (111.585 kg)  BMI 30.75 kg/m2  SpO2 97% Physical Exam  Nursing note and vitals reviewed. Constitutional: He is oriented to person, place, and time. He appears well-developed and well-nourished. No distress.  HENT:  Head: Normocephalic and atraumatic.  Mouth/Throat: Oropharynx is clear and moist.  Neck: Normal range of motion. Neck supple.  Cardiovascular: Normal rate and regular rhythm.   No murmur heard. Pulmonary/Chest: Effort  normal and breath sounds normal.  No respiratory distress. He has no wheezes.  Abdominal: Bowel sounds are normal. He exhibits distension. He exhibits no mass. There is tenderness. There is no rebound and no guarding.  There is ttp in the epigastric region with no rebound or guarding.  He is somewhat distended and tympanitic.    Musculoskeletal: Normal range of motion.  Neurological: He is alert and oriented to person, place, and time.  Skin: Skin is warm and dry. No rash noted. He is not diaphoretic.    ED Course  Procedures (including critical care time) Labs Review Labs Reviewed  CBC WITH DIFFERENTIAL  COMPREHENSIVE METABOLIC PANEL  LIPASE, BLOOD  URINALYSIS, ROUTINE W REFLEX MICROSCOPIC   Imaging Review No results found.   Date: 08/22/2013  Rate: 79  Rhythm: normal sinus rhythm  QRS Axis: normal  Intervals: normal  ST/T Wave abnormalities: nonspecific T wave changes  Conduction Disutrbances:left bundle branch block  Narrative Interpretation:   Old EKG Reviewed: unchanged    MDM  No diagnosis found. Patient is a 73 year old male with extensive PMH, including COPD on home oxygen, DM, AAA repair, ileal resection, and bowel obstruction.  He presents today with complaints of epigastric pain since last night.  Workup reveals what appears to be an ileus with dilated loops of bowel but no clear transition point that appears to be an obstruction.  He has been given morphine on two occasions and is feeling somewhat better.  I have spoken with Dr. Maisie Fus from General Surgery who recommends admission to medicine due to his comorbidities and will consult on the patient.    Geoffery Lyons, MD 08/22/13 [phone]

## 2013-08-22 NOTE — ED Notes (Signed)
Return from CT scan and c/o increase in pain level to 7/10. Dr. Judd Lien aware

## 2013-08-22 NOTE — ED Notes (Signed)
Report received, pt care assumed. Pt resting quietly in nad.

## 2013-08-22 NOTE — ED Notes (Signed)
Pt wife phoned and updated on poc, pt pending transport and admit to [facility] room 1521.

## 2013-08-23 DIAGNOSIS — R269 Unspecified abnormalities of gait and mobility: Secondary | ICD-10-CM

## 2013-08-23 DIAGNOSIS — R109 Unspecified abdominal pain: Secondary | ICD-10-CM

## 2013-08-23 DIAGNOSIS — R259 Unspecified abnormal involuntary movements: Secondary | ICD-10-CM

## 2013-08-23 LAB — COMPREHENSIVE METABOLIC PANEL
ALT: 17 U/L (ref 0–53)
AST: 20 U/L (ref 0–37)
Albumin: 3.3 g/dL — ABNORMAL LOW (ref 3.5–5.2)
Alkaline Phosphatase: 54 U/L (ref 39–117)
BILIRUBIN TOTAL: 0.6 mg/dL (ref 0.3–1.2)
BUN: 11 mg/dL (ref 6–23)
CALCIUM: 8.5 mg/dL (ref 8.4–10.5)
CO2: 29 mEq/L (ref 19–32)
CREATININE: 0.94 mg/dL (ref 0.50–1.35)
Chloride: 104 mEq/L (ref 96–112)
GFR, EST NON AFRICAN AMERICAN: 82 mL/min — AB (ref >90)
GLUCOSE: 102 mg/dL — AB (ref 70–99)
Potassium: 4.1 mEq/L (ref 3.7–5.3)
Sodium: 144 mEq/L (ref 137–147)
Total Protein: 6.1 g/dL (ref 6.0–8.3)

## 2013-08-23 LAB — GLUCOSE, CAPILLARY
GLUCOSE-CAPILLARY: 131 mg/dL — AB (ref 70–99)
Glucose-Capillary: 135 mg/dL — ABNORMAL HIGH (ref 70–99)
Glucose-Capillary: 114 mg/dL — ABNORMAL HIGH (ref 70–99)

## 2013-08-23 LAB — AMYLASE: Amylase: 78 U/L (ref 0–105)

## 2013-08-23 LAB — LIPASE, BLOOD: LIPASE: 20 U/L (ref 11–59)

## 2013-08-23 MED ORDER — ALBUTEROL SULFATE (2.5 MG/3ML) 0.083% IN NEBU
2.5000 mg | INHALATION_SOLUTION | RESPIRATORY_TRACT | Status: DC | PRN
  Administered 2013-08-24: 2.5 mg via RESPIRATORY_TRACT
  Filled 2013-08-23: qty 3

## 2013-08-23 MED ORDER — PANTOPRAZOLE SODIUM 40 MG PO TBEC
40.0000 mg | DELAYED_RELEASE_TABLET | Freq: Two times a day (BID) | ORAL | Status: DC
  Administered 2013-08-23 – 2013-08-24 (×2): 40 mg via ORAL
  Filled 2013-08-23 (×3): qty 1

## 2013-08-23 MED ORDER — INSULIN GLARGINE 100 UNIT/ML ~~LOC~~ SOLN
25.0000 [IU] | Freq: Every day | SUBCUTANEOUS | Status: DC
  Filled 2013-08-23: qty 0.25

## 2013-08-23 MED ORDER — POLYETHYLENE GLYCOL 3350 17 G PO PACK
17.0000 g | PACK | Freq: Every day | ORAL | Status: DC
  Administered 2013-08-23 – 2013-08-24 (×2): 17 g via ORAL
  Filled 2013-08-23 (×2): qty 1

## 2013-08-23 MED ORDER — INSULIN GLARGINE 100 UNIT/ML ~~LOC~~ SOLN: 50.0000 [IU] | Freq: Every day | SUBCUTANEOUS | Status: DC

## 2013-08-23 MED ORDER — SENNOSIDES-DOCUSATE SODIUM 8.6-50 MG PO TABS
1.0000 | ORAL_TABLET | Freq: Two times a day (BID) | ORAL | Status: DC
  Administered 2013-08-23 – 2013-08-24 (×3): 1 via ORAL
  Filled 2013-08-23 (×4): qty 1

## 2013-08-23 NOTE — Progress Notes (Signed)
Patient ID: [name], male   DOB: [birth year], 73 y.o.   MRN: [id number]  [facility] PROGRESS NOTE  [name] CBJ:[id number] DOB: 08/03/1940 DOA: 08/22/2013 PCP: [name], MD  Brief narrative: 73 y.o. male, with past medical history significant for small bowel obstruction status post ileum resection due to bleeding, COPD and coronary artery disease presenting with 2 days history of generalized abdominal pain, mainly in the epigastric area. No history of nausea or vomiting. No history of diarrhea. CT of the abdomen showed a probable ileus but not conclusive. [facility] asked to admit for further evaluation.   Principal Problem:   Abdominal pain, other specified site - this appears to be secondary to ileus, pt still constipated and with minimal bowel movement - will place on miralax and senna  - will monitor closely and will plan on advancing diet as pt able to tolerate - continue analgesia and antiemetics as needed - encourage ambulation and hydration via oral route - discussed diagnosis, CT scan, blood work, and dietary recommendations over 30 minutes Active Problems:   Ileus - management as noted above  - placed on bowel regimen    COPD - clinically stable - maintaining oxygen saturation at target ranges, on oxygen at home   Diabetes mellitus - will continue Lantus per home regimen but will lower the dose from 70 U to 50 U until appetite improves - hold Metformin for now - place on SSI  - check A1C   Hypothyroidism - will check TSH and continue home regimen with synthroid   Consultants:  None  Procedures/Studies: Ct Abdomen Pelvis W Contrast   08/22/2013  Lung base opacities favor atelectasis and/or scarring. Background air trapping and emphysema.  Small bowel anastomosis right lower quadrant is similar to prior. Proximal small bowel loops are dilated up to 4.1 cm with no clear transition point. May reflect an ileus. Correlate with symptoms and a radiograph  follow-up if warranted to document passage of ingested contrast into the colon.  Cholelithiasis.  No CT evidence for cholecystitis.  Antibiotics:  None   Code Status: Full Family Communication: Pt at bedside Disposition Plan: Home when medically stable, possibly in 1-2 days   HPI/Subjective: No events overnight.   Objective: Filed Vitals:   08/22/13 2240 08/23/13 0602 08/23/13 1200 08/23/13 1512  BP: 127/55 117/59  158/76  Pulse: 76 93  60  Temp: 98.2 F (36.8 C) 97.4 F (36.3 C)  98.2 F (36.8 C)  TempSrc: Oral Oral  Oral  Resp: 18 18  20   Height:      Weight:      SpO2: 93% 94% 94% 94%    Intake/Output Summary (Last 24 hours) at 08/23/13 1807 Last data filed at 08/23/13 1500  Gross per 24 hour  Intake   1200 ml  Output    400 ml  Net    800 ml    Exam:   General:  Pt is alert, follows commands appropriately, not in acute distress  Cardiovascular: Regular rate and rhythm, S1/S2, no murmurs, no rubs, no gallops  Respiratory: Clear to auscultation bilaterally, no wheezing, no crackles, no rhonchi  Abdomen: Soft, non tender, non distended, bowel sounds present, no guarding  Extremities: No edema, pulses DP and PT palpable bilaterally  Neuro: Grossly nonfocal  Data Reviewed: Basic Metabolic Panel:  Recent Labs Lab 08/22/13 0345 08/22/13 2020 08/23/13 0453  NA 147  --  144  K 4.4  --  4.1  CL 102  --  104  CO2 34*  --  29  GLUCOSE 78  --  102*  BUN 20  --  11  CREATININE 1.00 0.86 0.94  CALCIUM 10.6*  --  8.5   Liver Function Tests:  Recent Labs Lab 08/22/13 0345 08/23/13 0453  AST 21 20  ALT 24 17  ALKPHOS 73 54  BILITOT 0.6 0.6  PROT 7.4 6.1  ALBUMIN 4.3 3.3*    Recent Labs Lab 08/22/13 0345 08/23/13 0453  LIPASE 42 20  AMYLASE  --  78   CBC:  Recent Labs Lab 08/22/13 0345 08/22/13 2020  WBC 9.3 8.1  NEUTROABS 6.4  --   HGB 13.0 12.6*  HCT 40.7 39.2  MCV 84.8 83.8  PLT 146* 135*   CBG:  Recent Labs Lab  08/22/13 1655 08/22/13 2236 08/23/13 0818 08/23/13 1209 08/23/13 1710  GLUCAP 80 130* 131* 135* 114*   Scheduled Meds: . docusate sodium  100 mg Oral BID  . enoxaparin (LOVENOX) injection  40 mg Subcutaneous Q24H  . fluticasone  2 puff Inhalation BID  . gabapentin  800 mg Oral BID  . hydroxychloroquine  200 mg Oral Daily  . insulin aspart  0-9 Units Subcutaneous TID WC  . levothyroxine  25 mcg Oral QAC breakfast  . pantoprazole (PROTONIX) IV  40 mg Intravenous Q12H  . polyethylene glycol  17 g Oral Daily  . salmeterol  1 puff Inhalation Q12H  . senna-docusate  1 tablet Oral BID  . tamsulosin  0.4 mg Oral QHS  . tiotropium  18 mcg Inhalation Daily   Continuous Infusions: . dextrose 5 % and 0.45% NaCl 75 mL/hr at 08/22/13 1259   Dakota Presto, MD  [facility] Pager [phone]  If 7PM-7AM, please contact night-coverage [email or web address] Password [email or web address] 08/23/2013, 6:07 PM   LOS: 1 day

## 2013-08-24 DIAGNOSIS — E119 Type 2 diabetes mellitus without complications: Secondary | ICD-10-CM

## 2013-08-24 DIAGNOSIS — J9610 Chronic respiratory failure, unspecified whether with hypoxia or hypercapnia: Secondary | ICD-10-CM

## 2013-08-24 DIAGNOSIS — K560 Paralytic ileus: Principal | ICD-10-CM

## 2013-08-24 LAB — BASIC METABOLIC PANEL
BUN: 11 mg/dL (ref 6–23)
CO2: 29 meq/L (ref 19–32)
CREATININE: 0.95 mg/dL (ref 0.50–1.35)
Calcium: 9.3 mg/dL (ref 8.4–10.5)
Chloride: 98 mEq/L (ref 96–112)
GFR calc Af Amer: >90 mL/min (ref >90)
GFR calc non Af Amer: 82 mL/min — ABNORMAL LOW (ref >90)
GLUCOSE: 166 mg/dL — AB (ref 70–99)
Potassium: 4.3 mEq/L (ref 3.7–5.3)
Sodium: 139 mEq/L (ref 137–147)

## 2013-08-24 LAB — TSH: TSH: 0.719 u[IU]/mL (ref 0.350–4.500)

## 2013-08-24 LAB — CBC
HCT: 37.8 % — ABNORMAL LOW (ref 39.0–52.0)
HEMOGLOBIN: 12.1 g/dL — AB (ref 13.0–17.0)
MCH: 26.9 pg (ref 26.0–34.0)
MCHC: 32 g/dL (ref 30.0–36.0)
MCV: 84.2 fL (ref 78.0–100.0)
Platelets: 125 10*3/uL — ABNORMAL LOW (ref 150–400)
RBC: 4.49 MIL/uL (ref 4.22–5.81)
RDW: 15 % (ref 11.5–15.5)
WBC: 9 10*3/uL (ref 4.0–10.5)

## 2013-08-24 LAB — HEMOGLOBIN A1C
Hgb A1c MFr Bld: 8.5 % — ABNORMAL HIGH (ref <5.7)
MEAN PLASMA GLUCOSE: 197 mg/dL — AB (ref <117)

## 2013-08-24 LAB — GLUCOSE, CAPILLARY
Glucose-Capillary: 150 mg/dL — ABNORMAL HIGH (ref 70–99)
Glucose-Capillary: 152 mg/dL — ABNORMAL HIGH (ref 70–99)
Glucose-Capillary: 174 mg/dL — ABNORMAL HIGH (ref 70–99)

## 2013-08-24 MED ORDER — INSULIN GLARGINE 100 UNIT/ML ~~LOC~~ SOLN: 45.0000 [IU] | Freq: Every day | SUBCUTANEOUS | Status: DC

## 2013-08-24 MED ORDER — SENNOSIDES-DOCUSATE SODIUM 8.6-50 MG PO TABS: 1.0000 | ORAL_TABLET | Freq: Every day | ORAL | Status: DC

## 2013-08-24 MED ORDER — LOSARTAN POTASSIUM 50 MG PO TABS
50.0000 mg | ORAL_TABLET | Freq: Every day | ORAL | Status: DC
  Administered 2013-08-24: 50 mg via ORAL
  Filled 2013-08-24: qty 1

## 2013-08-24 MED ORDER — LOSARTAN POTASSIUM 50 MG PO TABS: 50.0000 mg | ORAL_TABLET | Freq: Every day | ORAL | Status: DC

## 2013-08-24 NOTE — Progress Notes (Signed)
Several pages tonight from RN about BP above 160. Looking back at flowsheet, BP has been slightly elevated, although not dangerously high, since admission. This NP looked in records as far back as 2012 and found HTN to be in his PMHx without any meds noted on office notes. I did see where he was on Cozaar 50mg  at one point in 2012 but no documentation as to why it stopped. I will report to oncoming attending to consider placing pt back on a home BP med. 2013, NP [facility]

## 2013-08-24 NOTE — Clinical Documentation Improvement (Signed)
Noted pt is on home O2 Please  specify the possible clinical conditions?  Acute Respiratory Failure Acute on Chronic Respiratory Failure Chronic Respiratory Failure Acute Respiratory Insufficiency  Other Condition Cannot Clinically Determine   Supporting Information: ED note:"history of COPD on home oxygen"  Risk Factors: CAD, HTN, COPD, quit smoking in 2000 x 40 yrs. 2.5 ppd   Signs & Symptoms: 08/22/13  "home CPAP set up " pn 08/23/13: "maintaining oxygen saturation at target ranges, on oxygen at home"  Treatment:  Albuterol Inhalation 2 puffs into the lungs every 4 (four) hours as needed. Shortness of breath Albuterol 1 vial in nebulizer every 6 hours as needed CPAP     Thank You, Andy Gauss ,RN Clinical Documentation Specialist:  [phone]  [facility]- Health Information Management

## 2013-08-24 NOTE — Progress Notes (Signed)
Pt BP 176/69 at 2200, MD notified and no new orders given. BP rechecked at 0100 and was 165/78. MD notified once more and no new orders were given. Will continue to monitor pt.

## 2013-08-24 NOTE — Progress Notes (Signed)
PT Cancellation Note  Patient Details Name: [name] MRN: [id number] DOB: 1941/06/18   Cancelled Treatment:    Reason Eval/Treat Not Completed: Pt screened, no needs identified, will sign off (spoke briefly with pt who denied any need for PT. Will sign off. )   Rebeca Alert, MPT Pager: [phone]

## 2013-08-24 NOTE — Progress Notes (Signed)
Patient discharge home with wife alert and oriented, discharge instructions given, patient verbalize understanding of discharge instructions given, active member of My Chart, patient in stable condition at this time

## 2013-08-25 NOTE — Discharge Summary (Signed)
Physician Discharge Summary  [name] [id number] DOB: 1940-12-07 DOA: 08/22/2013  PCP: [name], MD  Admit date: 08/22/2013 Discharge date: 08/25/2013  Time spent: 45 minutes  Recommendations for Outpatient Follow-up:  1. PCP in 1 week  Discharge Diagnoses:  Principal Problem:   Abdominal pain, other specified site Active Problems:   Ileus   Abdominal pain    COPD   Diabetes Mellitus   Hypothyroidism   Discharge Condition: stable  Diet recommendation: carb modified  Filed Weights   08/22/13 0314 08/22/13 1644  Weight: 111.585 kg (246 lb) 113 kg (249 lb 1.9 oz)    History of present illness:  HPI  [name] is a 73 y.o. male, with past medical history significant for small bowel obstruction in the past status post ileum resection due to bleeding, COPD and coronary artery disease presenting today with 2 days history of generalized abdominal pain, mainly in the epigastric area. No history of nausea or vomiting. No history of diarrhea. Patient had a bowel movement yesterday late and has been having flatus. CT of the abdomen showed a probable ileus but not conclusive. Patient is feeling better since yesterday and was admitted for further evaluation.   Hospital Course:  Abdominal pain, other specified site  - felt to be secondary to ileus, has intermittent constipation, started on a bowel regimen with laxatives and stool softeners -improved with supportive care, IVf, -diet advanced to regular diet by the time of discharge. -had a BM prior to discharge -advised compliance with bowel regimen  Ileus  - management as noted above  - placed on bowel regimen   COPD  - clinically stable  - on Home O2  Diabetes mellitus  - will continue Lantus per home regimen but will lower the dose from 70 U to 50 U until appetite improves  - resumed metformin at discharge  Hypothyroidism  - continue home regimen with synthroid    Discharge Exam: Filed Vitals:   08/24/13 0900  BP: 168/73  Pulse: 96  Temp: 100.5 F (38.1 C)  Resp:     General: AAOx3,  Cardiovascular: S1S2/RRR Respiratory: CTAB  Discharge Instructions  Discharge Orders   Future Appointments Provider Department Dept Phone   09/20/2013 9:00 AM Corwin Levins, MD [facility] [place] [phone]   10/03/2013 9:30 AM Nilda Riggs, NP [facility] [phone]   Future Orders Complete By Expires   Diet - low sodium heart healthy  As directed    Diet Carb Modified  As directed    Increase activity slowly  As directed        Medication List         albuterol 108 (90 BASE) MCG/ACT inhaler  Commonly known as:  PROAIR HFA  Inhale 2 puffs into the lungs every 4 (four) hours as needed. Shortness of breath     albuterol (2.5 MG/3ML) 0.083% nebulizer solution  Commonly known as:  PROVENTIL  - 1 vial in nebulizer every 6 hours as needed  - Dx 496     ASMANEX 60 METERED DOSES 220 MCG/INH inhaler  Generic drug:  mometasone  Inhale 2 puffs into the lungs daily.     FLOMAX 0.4 MG Caps capsule  Generic drug:  tamsulosin  Take by mouth at bedtime.     FORADIL AEROLIZER 12 MCG capsule for inhaler  Generic drug:  formoterol  Inhale contents of 1 capsule two times a day     gabapentin 800 MG tablet  Commonly known  as:  NEURONTIN  Take 800 mg by mouth 2 (two) times daily.     insulin glargine 100 UNIT/ML injection  Commonly known as:  LANTUS  Inject 0.45 mLs (45 Units total) into the skin at bedtime. Can increase slowly by 5 units daily upto 70units depending on blood sugars during this week     levothyroxine 25 MCG tablet  Commonly known as:  SYNTHROID, LEVOTHROID  Take 25 mcg by mouth daily.     losartan 50 MG tablet  Commonly known as:  COZAAR  Take 1 tablet (50 mg total) by mouth daily.     MEGA MULTIVITAMIN FOR MEN PO  Take by mouth daily.     metFORMIN 1000 MG tablet  Commonly known as:  GLUCOPHAGE  Take 1,000 mg by  mouth 2 (two) times daily.     naphazoline 0.012 % ophthalmic solution  Commonly known as:  CLEAR EYES  Place 1 drop into both eyes as needed. Red eyes     naproxen sodium 220 MG tablet  Commonly known as:  ANAPROX  Take 440 mg by mouth 2 (two) times daily with a meal.     NOVOLOG 100 UNIT/ML injection  Generic drug:  insulin aspart  8-10 Units See admin instructions. Sliding scale as needed with meals (8-10 units at supper).     PLAQUENIL 200 MG tablet  Generic drug:  hydroxychloroquine  Take 1 tablet by mouth Daily.     senna-docusate 8.6-50 MG per tablet  Commonly known as:  Senokot-S  Take 1 tablet by mouth daily.     simvastatin 20 MG tablet  Commonly known as:  ZOCOR  Take 10 mg by mouth at bedtime.     SPIRIVA HANDIHALER 18 MCG inhalation capsule  Generic drug:  tiotropium  Place 18 mcg into inhaler and inhale daily.     Vitamin D3 2000 UNITS Tabs  Take 1 tablet by mouth daily.       No Known Allergies     Follow-up Information   Follow up with [name], MD. Schedule an appointment as soon as possible for a visit in 1 week.   Specialties:  Internal Medicine, Radiology   Contact information:   [address] [place] [address] [phone]        The results of significant diagnostics from this hospitalization (including imaging, microbiology, ancillary and laboratory) are listed below for reference.    Significant Diagnostic Studies: Ct Abdomen Pelvis W Contrast  08/22/2013   CLINICAL DATA:  Upper left abdominal pain  EXAM: CT ABDOMEN AND PELVIS WITH CONTRAST  TECHNIQUE: Multidetector CT imaging of the abdomen and pelvis was performed using the standard protocol following bolus administration of intravenous contrast.  CONTRAST:  OMNIPAQUE IOHEXOL 300 MG/ML SOLN, 4mL OMNIPAQUE IOHEXOL 300 MG/ML SOLN  COMPARISON:  07/19/2013  FINDINGS: Right pleural thickening and associated calcifications may reflect sequelae of prior infection or  pneumothorax. Coronary artery calcifications. Heart size upper normal. Emphysema and areas of air trapping. Atelectasis or scarring within the right greater than left lung bases.  No appreciable abnormality of the liver, spleen, pancreas, adrenal glands. Gallstones. No biliary ductal dilatation.  Lower pole right renal cyst. Otherwise, symmetric renal enhancement. No hydroureteronephrosis.  No CT evidence for colitis. Colonic diverticulosis. Normal appendix. Small bowel anastomosis right lower quadrant with dilatation of the anastomosis loop, similar to prior. Dilated proximal small bowel loops up to 4.1 cm, with air-fluid levels. No free intraperitoneal air or fluid. No lymphadenopathy.  Scattered atherosclerosis of the aorta and branch vessels. Infrarenal ectasia. 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 See recent CTA report for detailed vascular evaluation.  Partially decompressed bladder. Small fat containing right inguinal hernia. Prostate gland measures 5 cm transverse diameter.  Multilevel degenerative changes.  No acute osseous finding.  IMPRESSION: Lung base opacities favor atelectasis and/or scarring. Background air trapping and emphysema.  Small bowel anastomosis right lower quadrant is similar to prior. Proximal small bowel loops are dilated up to 4.1 cm with no clear transition point. May reflect an ileus. Correlate with symptoms and a radiograph follow-up if warranted to document passage of ingested contrast into the colon.  Cholelithiasis.  No CT evidence for cholecystitis.  See recent CTA report regarding the aorta and branch vessels.   Electronically Signed   By: Jearld Lesch M.D.   On: 08/22/2013 06:37    Microbiology: No results found for this or any previous visit (from the past 240 hour(s)).   Labs: Basic Metabolic Panel:  Recent Labs Lab 08/22/13 0345 08/22/13 2020 08/23/13 0453 08/24/13 0530  NA 147  --  144 139  K 4.4  --  4.1 4.3  CL 102   --  104 98  CO2 34*  --  29 29  GLUCOSE 78  --  102* 166*  BUN 20  --  11 11  CREATININE 1.00 0.86 0.94 0.95  CALCIUM 10.6*  --  8.5 9.3   Liver Function Tests:  Recent Labs Lab 08/22/13 0345 08/23/13 0453  AST 21 20  ALT 24 17  ALKPHOS 73 54  BILITOT 0.6 0.6  PROT 7.4 6.1  ALBUMIN 4.3 3.3*    Recent Labs Lab 08/22/13 0345 08/23/13 0453  LIPASE 42 20  AMYLASE  --  78   No results found for this basename: AMMONIA,  in the last 168 hours CBC:  Recent Labs Lab 08/22/13 0345 08/22/13 2020 08/24/13 0530  WBC 9.3 8.1 9.0  NEUTROABS 6.4  --   --   HGB 13.0 12.6* 12.1*  HCT 40.7 39.2 37.8*  MCV 84.8 83.8 84.2  PLT 146* 135* 125*   Cardiac Enzymes: No results found for this basename: CKTOTAL, CKMB, CKMBINDEX, TROPONINI,  in the last 168 hours BNP: BNP (last 3 results) No results found for this basename: PROBNP,  in the last 8760 hours CBG:  Recent Labs Lab 08/23/13 1209 08/23/13 1710 08/23/13 2148 08/24/13 0426 08/24/13 0737  GLUCAP 135* 114* 150* 152* 174*       Signed:  Audrick Lamoureaux  [facility] 08/25/2013, 5:46 PM

## 2013-09-20 ENCOUNTER — Ambulatory Visit (INDEPENDENT_AMBULATORY_CARE_PROVIDER_SITE_OTHER): Payer: Medicare Other | Admitting: Internal Medicine

## 2013-09-20 ENCOUNTER — Encounter: Payer: Self-pay | Admitting: Internal Medicine

## 2013-09-20 VITALS — BP 122/68 | HR 65 | Temp 97.0°F | Wt 246.0 lb

## 2013-09-20 DIAGNOSIS — Z23 Encounter for immunization: Secondary | ICD-10-CM

## 2013-09-20 DIAGNOSIS — E119 Type 2 diabetes mellitus without complications: Secondary | ICD-10-CM

## 2013-09-20 DIAGNOSIS — K560 Paralytic ileus: Secondary | ICD-10-CM

## 2013-09-20 DIAGNOSIS — K567 Ileus, unspecified: Secondary | ICD-10-CM

## 2013-09-20 DIAGNOSIS — Z0000 Encounter for general adult medical examination without abnormal findings: Secondary | ICD-10-CM

## 2013-09-20 DIAGNOSIS — I10 Essential (primary) hypertension: Secondary | ICD-10-CM

## 2013-09-20 MED ORDER — INSULIN GLARGINE 100 UNIT/ML ~~LOC~~ SOLN: 75.0000 [IU] | Freq: Every day | SUBCUTANEOUS | Status: DC

## 2013-09-20 NOTE — Addendum Note (Signed)
Addended by: Scharlene Gloss B on: 09/20/2013 10:06 AM   Modules accepted: Orders

## 2013-09-20 NOTE — Progress Notes (Signed)
Subjective:    Patient ID: [name], male    DOB: [birth year], 73 y.o.   MRN: [id number]  HPI  Here to f/u uncomplicated transient ileus and abd pain, but had no n/v or lack of BM, did not require NG, symptoms improved with hydration and supportive care, pt himself felt somewhat volume overloaded at time of d/c with very mild worsening dyspnea, elected to take a prn diuretic once he got home, and dyspnea improved to baseline. Denies worsening reflux, abd pain, dysphagia, n/v, bowel change or blood.  Pt denies fever, wt loss, night sweats, loss of appetite, or other constitutional symptoms  Pt denies chest pain, increased sob or doe, wheezing, orthopnea, PND, increased LE swelling, palpitations, dizziness or syncope. Stool softner nightly required to help with constipation, but appears to work well. Sees endo, had lantus reduced to 45 while hospd, now back to taking 70-75 lantus daily, cbg this am 111. CBG's had been elev more than usual recently due to prednisone use. No joint pains at this time Past Medical History  Diagnosis Date  . THRUSH 11/06/2009  . HYPOTHYROIDISM 07/30/2009  . DIABETES MELLITUS, TYPE II 11/23/2009  . DEPRESSION 11/23/2009  . 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 HYPERTENSION 07/30/2009  . CORONARY ARTERY DISEASE 11/23/2009  . CHRONIC OBSTRUCTIVE PULMONARY DISEASE, ACUTE EXACERBATION 03/01/2010  . C O P D 07/30/2009  . 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 RESPIRATORY FAILURE, CHRONIC 07/31/2009  . BENIGN PROSTATIC HYPERTROPHY 11/23/2009  . DEGENERATIVE JOINT DISEASE 11/23/2009  . 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 FATIGUE 11/23/2009  . 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 GAIT DISTURBANCE 12/10/2009  . DYSPNEA/SHORTNESS OF BREATH 12/08/2009  . HEMOPTYSIS UNSPECIFIED 05/07/2010  . RA (rheumatoid arthritis) 06/11/2011  . PTSD (post-traumatic stress disorder) 03/10/2012   Past Surgical History  Procedure Laterality Date  . Small bowel obstruction repair with adhesiolysis    . Hx of  remote ileum resection due to bleeding    . Abdominal aortic aneurysm repair    . S/p back surgury   1972    lumbar laminectomy Dr. Fannie Knee  . S/p left femoral embolectomy with left leg ischemia      Dr. Hart [place], vascular  . S/p right ganglion cyst      Dr. Teressa Senter    reports that he quit smoking about 15 years ago. His smoking use included Cigarettes, Pipe, and Cigars. He has a 100 pack-year smoking history. He has never used smokeless tobacco. He reports that he does not drink alcohol or use illicit drugs. family history is not on file. No Known Allergies Current Outpatient Prescriptions on File Prior to Visit  Medication Sig Dispense Refill  . albuterol (PROAIR HFA) 108 (90 BASE) MCG/ACT inhaler Inhale 2 puffs into the lungs every 4 (four) hours as needed. Shortness of breath  1 Inhaler  6  . albuterol (PROVENTIL) (2.5 MG/3ML) 0.083% nebulizer solution 1 vial in nebulizer every 6 hours as needed Dx 496  120 mL  6  . formoterol (FORADIL AEROLIZER) 12 MCG capsule for inhaler Inhale contents of 1 capsule two times a day       . gabapentin (NEURONTIN) 800 MG tablet Take 800 mg by mouth 2 (two) times daily.        . insulin aspart (NOVOLOG) 100 UNIT/ML injection 8-10 Units See admin instructions. Sliding scale as needed with meals (8-10 units at supper).      . insulin glargine (LANTUS) 100 UNIT/ML injection Inject 0.45 mLs (45 Units total) into the  skin at bedtime. Can increase slowly by 5 units daily upto 70units depending on blood sugars during this week      . losartan (COZAAR) 50 MG tablet Take 1 tablet (50 mg total) by mouth daily.  30 tablet  0  . metFORMIN (GLUCOPHAGE) 1000 MG tablet Take 1,000 mg by mouth 2 (two) times daily.        . mometasone (ASMANEX 60 METERED DOSES) 220 MCG/INH inhaler Inhale 2 puffs into the lungs daily.        . Multiple Vitamins-Minerals (MEGA MULTIVITAMIN FOR MEN PO) Take by mouth daily.        . naphazoline (CLEAR EYES) 0.012 % ophthalmic solution Place 1 drop  into both eyes as needed. Red eyes       . PLAQUENIL 200 MG tablet Take 1 tablet by mouth Daily.      [name] senna-docusate (SENOKOT-S) 8.6-50 MG per tablet Take 1 tablet by mouth daily.  20 tablet  0  . Tamsulosin HCl (FLOMAX) 0.4 MG CAPS Take by mouth at bedtime.        [name] tiotropium (SPIRIVA HANDIHALER) 18 MCG inhalation capsule Place 18 mcg into inhaler and inhale daily.        [name] levothyroxine (SYNTHROID, LEVOTHROID) 25 MCG tablet Take 25 mcg by mouth daily.       No current facility-administered medications on file prior to visit.   Review of Systems  Constitutional: Negative for unexpected weight change, or unusual diaphoresis  HENT: Negative for tinnitus.   Eyes: Negative for photophobia and visual disturbance.  Respiratory: Negative for choking and stridor.   Gastrointestinal: Negative for vomiting and blood in stool.  Genitourinary: Negative for hematuria and decreased urine volume.  Musculoskeletal: Negative for acute joint swelling Skin: Negative for color change and wound.  Neurological: Negative for tremors and numbness other than noted  Psychiatric/Behavioral: Negative for decreased concentration or  hyperactivity.       Objective:   Physical Exam BP 122/68  Pulse 65  Temp(Src) 97 F (36.1 C) (Oral)  Wt 246 lb (111.585 kg)  SpO2 93% VS noted, on port home o2 Constitutional: Pt appears well-developed and well-nourished.  HENT: Head: NCAT.  Right Ear: External ear normal.  Left Ear: External ear normal.  Eyes: Conjunctivae and EOM are normal. Pupils are equal, round, and reactive to light.  Neck: Normal range of motion. Neck supple.  Cardiovascular: Normal rate and regular rhythm.   Pulmonary/Chest: Effort normal and breath sounds decreased, few dry rale right mid lung field, no other rales or wheezing.  Abd:  Soft, NT, non-distended, + BS Neurological: Pt is alert. Not confused  Skin: Skin is warm. No erythema. No LE edema No active synovitis Psychiatric: Pt behavior  is normal. Thought content normal.     Assessment & Plan:

## 2013-09-20 NOTE — Assessment & Plan Note (Signed)
Resolved, , to f/u any worsening symptoms or concerns 

## 2013-09-20 NOTE — Patient Instructions (Addendum)
You had the new Prevnar pneumonia shot Please continue all other medications as before, and refills have been done if requested. Please have the pharmacy call with any other refills you may need.  Please keep your appointments with your specialists as you have planned  Please return in 6 months, or sooner if needed, with Lab testing done 3-5 days before

## 2013-09-20 NOTE — Progress Notes (Signed)
Pre-visit discussion using our clinic review tool. No additional management support is needed unless otherwise documented below in the visit note.  

## 2013-09-20 NOTE — Assessment & Plan Note (Signed)
Overall stable, to cont current med, f/u with endo as planned

## 2013-09-20 NOTE — Addendum Note (Signed)
Addended by: Corwin Levins on: 09/20/2013 09:57 AM   Modules accepted: Orders

## 2013-09-20 NOTE — Assessment & Plan Note (Signed)
stable overall by history and exam, recent data reviewed with pt, and pt to continue medical treatment as before,  to f/u any worsening symptoms or concerns BP Readings from Last 3 Encounters:  09/20/13 122/68  08/24/13 168/73  08/02/13 138/76

## 2013-09-21 ENCOUNTER — Telehealth: Payer: Self-pay

## 2013-09-21 NOTE — Telephone Encounter (Signed)
Relevant patient education assigned to patient using Emmi. ° °

## 2013-10-03 ENCOUNTER — Ambulatory Visit: Payer: Medicare Other | Admitting: Nurse Practitioner

## 2013-10-07 ENCOUNTER — Telehealth: Payer: Self-pay | Admitting: Internal Medicine

## 2013-10-07 DIAGNOSIS — Z961 Presence of intraocular lens: Secondary | ICD-10-CM | POA: Insufficient documentation

## 2013-10-07 DIAGNOSIS — Z79899 Other long term (current) drug therapy: Secondary | ICD-10-CM | POA: Insufficient documentation

## 2013-10-07 MED ORDER — PREDNISONE 10 MG PO TABS: ORAL_TABLET | ORAL | Status: DC

## 2013-10-07 MED ORDER — LEVOFLOXACIN 500 MG PO TABS: 500.0000 mg | ORAL_TABLET | Freq: Every day | ORAL | Status: DC

## 2013-10-07 NOTE — Telephone Encounter (Signed)
Spoke with pt. Reports cough, body aches and wheezing. Mucus is yellow in color. Has DOE at times. Denies chest tightness. Onset was 2 days ago. Since MR is not here today, he would like this message addressed by TP. He sees her periodically in [place].  TP - please advise. Thanks.

## 2013-10-07 NOTE — Telephone Encounter (Signed)
Called spoke with pt. He reports he is not having that much SOB. He does not feel he needs OV. rx's called in for pt.

## 2013-10-07 NOTE — Telephone Encounter (Signed)
If he has lot of breathing difficulty and inability to walk few feet he should go to ER or come to office. His call on how he feels. If not,   Rx for AECOPD  take levaquin 500mg  once daily  X 5 days Take prednisone 40 mg daily x 2 days, then 20mg  daily x 2 days, then 10mg  daily x 2 days, then 5mg  daily x 2 days and stop (if he is on chronic steroids he should go back to basal dose at end of taper)   Dr. , M.D., [facility].C.P Pulmonary and Critical Care Medicine Staff Physician [facility] Pager: [phone], If no answer or between  15:00h - 7:00h: call [phone]  10/07/2013 11:51 AM

## 2013-11-07 ENCOUNTER — Encounter: Payer: Self-pay | Admitting: Internal Medicine

## 2013-11-07 ENCOUNTER — Ambulatory Visit (INDEPENDENT_AMBULATORY_CARE_PROVIDER_SITE_OTHER): Payer: Medicare Other | Admitting: Internal Medicine

## 2013-11-07 VITALS — BP 132/72 | HR 74 | Ht 75.0 in | Wt 253.3 lb

## 2013-11-07 DIAGNOSIS — J449 Chronic obstructive pulmonary disease, unspecified: Secondary | ICD-10-CM

## 2013-11-07 DIAGNOSIS — J8410 Pulmonary fibrosis, unspecified: Secondary | ICD-10-CM

## 2013-11-07 NOTE — Patient Instructions (Addendum)
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 Only  Dr Leanna Battles or Dr. Trudie Reed to read Continue your regular medications and o2 for copd  FOllwup  2 months or sooner if needed

## 2013-11-07 NOTE — Progress Notes (Signed)
Subjective:    Patient ID: [name], male    DOB: 1941/06/03, 73 y.o.   MRN: [id number]  HPI   12 yowm quit smoking around 2000 with GOLD III COPD on 3-4 L o2. Meds through [facility]  He was hospitalised in 10/12 for acute on chronic hypercarbic resp failure. He also has gr 2 diastolic dysfunction & improved with diuresis . He was started on VPAP S auto on discharge [facility])  PSG 10/15 showed severe OSA, AHI 48/h with nadir desatn 78% correctd by CPAP 12 cm, 3 L O2 blended in , c flex +2 cm, humidity, A medium full face mask was used.  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 Desaturations persisted without resp events on CPAP 12 cm   Download 11/19-12/11/12 shows good control of events on CPAP 16 cm, good usage   08/31/2012  57m FU  Underwent Lung transplant eval - he finally decided to come off the list CPAP was at 12 cm but increased to 16 cm at his request in the past 7/13 >> Dropped pressure to 14 cm , Increase C flex to +3 On 3 L at rest  & sleep, pressure ok, mask OK Neurontin bid - around 7pm Monitor wts daily & takes lasix for increase in 2lbs or more  Breathing is unchanged. Still using CPAP every night for 8-9 hours. Denies chest pain, chest tightness, SOB or coughing.   OV 09/27/2012 This is returns for routine followup of COPD with some underlying pulmonary fibrosis but dominant component being COPD. Next issue is, obesity  In terms of obesity he has not lost any weightIN terms of weight: struggling with diet due to lack of participation from wife. HE says he will go back to diet and gym.   Estimated body mass index is 31.42 kg/(m^2) as calculated from the following:   Height as of this encounter: 6\' 3"  (1.905 m).   Weight as of this encounter: 251 lb 6.4 oz (114.034 kg).  In terms of COPD, Came off ttransplant list because he has copd more dominant than pulmonary fibrosis. They have advised him that fev1  needs to drop < 30% for transplant. This was too much for him and all social logisitics and with age and with wife with copd and prognosis of transplant. So, he had family discussion and decided against transplant.    07/14/2013 f/u ov/Wert re:  Chief Complaint  Patient presents with  . Acute Visit    Pt c/o cough- prod with bloody sputum x 2 days. Also c/o slight increase in SOB and chills.   was doing fine on 24/7 on 2 at rest, 4-5 with heavy ex like at the gym then started with yellow mucus then turned slt bloody, low grade fever, contact with sick grandchild  Doesn't use saba hfa or neb at baseline and only using at most twice daily saba hfa since onset of cough  No obvious day to day or daytime variabilty or assoc   cp or chest tightness, subjective wheeze overt sinus or hb symptoms. No unusual exp hx or h/o childhood pna/ asthma or knowledge of premature birth.  Sleeping ok without nocturnal  or early am exacerbation  of respiratory  c/o's or need for noct saba. Also denies any obvious fluctuation of symptoms with weather or environmental changes or other aggravating or alleviating factors except as outlined above    REC Zpak Pred  O V 08/02/2013  Chief Complaint  Patient presents with  . COPD    follow-up. Pt states symptoms are improved from visit with MW on 07-14-13.    REcent AECOPD dec 2014. Now well. COPD Meds: NAC + Spiriva + ASmanex  + foradil (all fre through [facility])  + 3L o2 at rest  COPD CAT socre is 8 S(ee below)(. He is working out aggressively at gym and self cranks O2 very high to 8L o2 without checking; gave him guidelines. No new issues. Wife has bad copd and is considering stent trial at [facility] Glad COPD is stable Continue your medications We will screen you for [employer] Research study GaLATHEA  - will keep you posted next few weeks REturn to see me in 3 months    OV 11/07/2013  Chief Complaint  Patient presents with  . COPD    follow-up. Pt c/o  increased SOB. he states he has already had 3 exacerbations this year.     Followup COPD, chronic respiratory failure  Compared to last visit he has had progressive decline in dyspnea. He had an exacerbation mid March 2015 for which was treated on the telephone with prednisone and antibiotics. He is back to baseline but nevertheless he is progressive worsening of dyspnea. COPD cat score is worsened to 19 but denies this  current exacerbation. At last visit he's not interested in COPD exacerbation research trials though he keeps getting repeated exacerbations. T this is due to logistic issues. He has not attended exercises for 2015: due to re[eated AECOPD and is currently deconditioned. HE is worried about progressive disease   COPD medications include N. acetylcysteine, Spiriva, Foradil and Asmanex. Also oxygen. He says is compliant with these. He did not tolerate roflumilast in the past   Last CT was few years ago. Last PFT was 2 years ago and none since then  Past medical history: In social history: Wife continues to have severe COPD. Both are worried about long-term prognosis. I discussed with him prognosis  expectations with COPD   CAT COPD Symptom & Quality of Life Score (GSK trademark) 0 is no burden. 5 is highest burden 08/02/2013  11/07/2013   Never Cough -> Cough all the time 1 2  No phlegm in chest -> Chest is full of phlegm 1 2  No chest tightness -> Chest feels very tight 0 1  No dyspnea for 1 flight stairs/hill -> Very dyspneic for 1 flight of stairs 3 4  No limitations for ADL at home -> Very limited with ADL at home 0 3  Confident leaving home -> Not at all confident leaving home 0 2  Sleep soundly -> Do not sleep soundly because of lung condition 0 1  Lots of Energy -> No energy at all 3 4  TOTAL Score (max 40)  8 10    Review of Systems  Constitutional: Negative for fever and unexpected weight change.  HENT: Negative for congestion, dental problem, ear pain, nosebleeds,  postnasal drip, rhinorrhea, sinus pressure, sneezing, sore throat and trouble swallowing.   Eyes: Negative for redness and itching.  Respiratory: Positive for shortness of breath. Negative for cough, chest tightness and wheezing.   Cardiovascular: Negative for palpitations and leg swelling.  Gastrointestinal: Negative for nausea and vomiting.  Genitourinary: Negative for dysuria.  Musculoskeletal: Negative for joint swelling.  Skin: Negative for rash.  Neurological: Negative for headaches.  Hematological: Does not bruise/bleed easily.  Psychiatric/Behavioral: Negative for dysphoric mood. The patient is not nervous/anxious.  Current outpatient prescriptions:albuterol (PROAIR HFA) 108 (90 BASE) MCG/ACT inhaler, Inhale 2 puffs into the lungs every 4 (four) hours as needed. Shortness of breath, Disp: 1 Inhaler, Rfl: 6;  albuterol (PROVENTIL) (2.5 MG/3ML) 0.083% nebulizer solution, 1 vial in nebulizer every 6 hours as needed Dx 496, Disp: 120 mL, Rfl: 6;  ALPRAZolam (XANAX) 0.5 MG tablet, Take 0.5 mg by mouth at bedtime. For sleep and anxiety, Disp: , Rfl:  atorvastatin (LIPITOR) 20 MG tablet, Take 20 mg by mouth daily., Disp: , Rfl: ;  formoterol (FORADIL AEROLIZER) 12 MCG capsule for inhaler, Inhale contents of 1 capsule two times a day , Disp: , Rfl: ;  gabapentin (NEURONTIN) 800 MG tablet, Take 800 mg by mouth 2 (two) times daily.  , Disp: , Rfl:  insulin aspart (NOVOLOG) 100 UNIT/ML injection, 8-10 Units See admin instructions. Sliding scale as needed with meals (8-10 units at supper)., Disp: , Rfl: ;  insulin glargine (LANTUS) 100 UNIT/ML injection, Inject 0.75 mLs (75 Units total) into the skin at bedtime. Can increase slowly by 5 units daily upto 70units depending on blood sugars during this week, Disp: 10 mL, Rfl:  levofloxacin (LEVAQUIN) 500 MG tablet, Take 1 tablet (500 mg total) by mouth daily., Disp: 5 tablet, Rfl: 0;  levothyroxine (SYNTHROID, LEVOTHROID) 25 MCG tablet, Take 25 mcg by mouth  daily., Disp: , Rfl: ;  losartan (COZAAR) 50 MG tablet, Take 1 tablet (50 mg total) by mouth daily., Disp: 30 tablet, Rfl: 0;  metFORMIN (GLUCOPHAGE) 1000 MG tablet, Take 1,000 mg by mouth 2 (two) times daily.  , Disp: , Rfl:  mometasone (ASMANEX 60 METERED DOSES) 220 MCG/INH inhaler, Inhale 2 puffs into the lungs daily.  , Disp: , Rfl: ;  Multiple Vitamins-Minerals (MEGA MULTIVITAMIN FOR MEN PO), Take by mouth daily.  , Disp: , Rfl: ;  naphazoline (CLEAR EYES) 0.012 % ophthalmic solution, Place 1 drop into both eyes as needed. Red eyes , Disp: , Rfl: ;  PLAQUENIL 200 MG tablet, Take 1 tablet by mouth Daily., Disp: , Rfl:  senna-docusate (SENOKOT-S) 8.6-50 MG per tablet, Take 1 tablet by mouth daily., Disp: 20 tablet, Rfl: 0;  sertraline (ZOLOFT) 100 MG tablet, Take 1/2 by mouth daily as needed, Disp: , Rfl: ;  Tamsulosin HCl (FLOMAX) 0.4 MG CAPS, Take by mouth at bedtime.  , Disp: , Rfl: ;  tiotropium (SPIRIVA HANDIHALER) 18 MCG inhalation capsule, Place 18 mcg into inhaler and inhale daily.  , Disp: , Rfl:      Objective:   Physical Exam  Filed Vitals:   11/07/13 1331 11/07/13 1332  BP: 132/72 132/72  Pulse: 82 74  Height: 6\' 3"  (1.905 m)   Weight: 253 lb 3.2 oz (114.851 kg) 253 lb 4.8 oz (114.896 kg)  SpO2: 88% 90%    Nursing note and vitals reviewed. Constitutional: He is oriented to person, place, and time. He appears well-developed and well-nourished. No distress.  HENT:  Head: Normocephalic and atraumatic.  Right Ear: External ear normal.  Left Ear: External ear normal.  Mouth/Throat: Oropharynx is clear and moist. No oropharyngeal exudate.  Eyes: Conjunctivae and EOM are normal. Pupils are equal, round, and reactive to light. Right eye exhibits no discharge. Left eye exhibits no discharge. No scleral icterus.  Neck: Normal range of motion. Neck supple. No JVD present. No tracheal deviation present. No thyromegaly present.  Cardiovascular: Normal rate, regular rhythm and intact  distal pulses.  Exam reveals no gallop and no friction rub.   No  murmur heard. Pulmonary/Chest: Effort normal and breath sounds normal. No respiratory distress. He has no wheezes. He has no rales. He exhibits no tenderness.  barrell chest . Chronic purse lip breathing + Abdominal: Soft. Bowel sounds are normal. He exhibits no distension and no mass. There is no tenderness. There is no rebound and no guarding.  Musculoskeletal: Normal range of motion. He exhibits no edema and no tenderness.  Lymphadenopathy:    He has no cervical adenopathy.  Neurological: He is alert and oriented to person, place, and time. He has normal reflexes. No cranial nerve deficit. Coordination normal.  Skin: Skin is warm and dry. No rash noted. He is not diaphoretic. No erythema. No pallor.  Psychiatric: He has a normal mood and affect. His behavior is normal. Judgment and thought content normal.       Assessment & Plan:

## 2013-11-11 ENCOUNTER — Ambulatory Visit (INDEPENDENT_AMBULATORY_CARE_PROVIDER_SITE_OTHER)
Admission: RE | Admit: 2013-11-11 | Discharge: 2013-11-11 | Disposition: A | Discharge status: Home / Self Care | Payer: Medicare Other | Source: Ambulatory Visit | Attending: Internal Medicine | Admitting: Internal Medicine

## 2013-11-11 DIAGNOSIS — J449 Chronic obstructive pulmonary disease, unspecified: Secondary | ICD-10-CM

## 2013-11-11 DIAGNOSIS — J8410 Pulmonary fibrosis, unspecified: Secondary | ICD-10-CM

## 2013-11-13 ENCOUNTER — Telehealth: Payer: Self-pay | Admitting: Internal Medicine

## 2013-11-13 DIAGNOSIS — I2510 Atherosclerotic heart disease of native coronary artery without angina pectoris: Secondary | ICD-10-CM

## 2013-11-13 NOTE — Telephone Encounter (Signed)
Let him know ct chest stable without new issues.  However, he does have Coronary artery calcification: if he has not had cardiac stress test in past few year - please refer to cardiology becase any problems here could be contributing to dyspnea  Dr. Kalman Shan, M.D., [facility].C.P Pulmonary and Critical Care Medicine Staff Physician  System [place] [facility] Pager: [phone], If no answer or between  15:00h - 7:00h: call [phone]  11/13/2013 8:19 PM

## 2013-11-13 NOTE — Assessment & Plan Note (Addendum)
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 Only  Dr Melinda Blietz or Dr. Daniel Entrikin to read Continue your regular medications and o2 for copd  FOllwup  2 months or sooner if needed  

## 2013-11-17 NOTE — Telephone Encounter (Signed)
I called made pt aware of results. He has not had a cardiac stress test. He is aware we will refer to a cardiolog. Nothing further needed

## 2013-12-22 NOTE — Progress Notes (Addendum)
HPI Patient is a 73 yo is referred for ischemic evaluation.  He is followed in pulm clinic for COPD  CT of chest showed coronary calcifiations. The patient has had 2 heart caths in past.  One in early 2000s ([facility] [place] showed one uclerated plaque.  Rec medical Rx.  Second was at [facility] he had no significant CAD  He denies CP  Does have occaisonal L arm discomfort.  NOt assicatied with activity. Breathing is limited but stable.    No Known Allergies  Current Outpatient Prescriptions  Medication Sig Dispense Refill  . albuterol (PROAIR HFA) 108 (90 BASE) MCG/ACT inhaler Inhale 2 puffs into the lungs every 4 (four) hours as needed. Shortness of breath  1 Inhaler  6  . albuterol (PROVENTIL) (2.5 MG/3ML) 0.083% nebulizer solution 1 vial in nebulizer every 6 hours as needed Dx 496  120 mL  6  . ALPRAZolam (XANAX) 0.5 MG tablet Take 0.5 mg by mouth at bedtime. For sleep and anxiety      . atorvastatin (LIPITOR) 20 MG tablet Take 20 mg by mouth daily.      . formoterol (FORADIL AEROLIZER) 12 MCG capsule for inhaler Inhale contents of 1 capsule two times a day       . gabapentin (NEURONTIN) 800 MG tablet Take 800 mg by mouth 2 (two) times daily.        . insulin aspart (NOVOLOG) 100 UNIT/ML injection 8-10 Units See admin instructions. Sliding scale as needed with meals (8-10 units at supper).      . insulin glargine (LANTUS) 100 UNIT/ML injection Inject 0.75 mLs (75 Units total) into the skin at bedtime. Can increase slowly by 5 units daily upto 70units depending on blood sugars during this week  10 mL    . levofloxacin (LEVAQUIN) 500 MG tablet Take 1 tablet (500 mg total) by mouth daily.  5 tablet  0  . levothyroxine (SYNTHROID, LEVOTHROID) 25 MCG tablet Take 25 mcg by mouth daily.      [name] losartan (COZAAR) 50 MG tablet Take 1 tablet (50 mg total) by mouth daily.  30 tablet  0  . metFORMIN (GLUCOPHAGE) 1000 MG tablet Take 1,000 mg by mouth 2 (two) times daily.        . mometasone (ASMANEX 60 METERED  DOSES) 220 MCG/INH inhaler Inhale 2 puffs into the lungs daily.        . Multiple Vitamins-Minerals (MEGA MULTIVITAMIN FOR MEN PO) Take by mouth daily.        . naphazoline (CLEAR EYES) 0.012 % ophthalmic solution Place 1 drop into both eyes as needed. Red eyes       . PLAQUENIL 200 MG tablet Take 1 tablet by mouth Daily.      [name] senna-docusate (SENOKOT-S) 8.6-50 MG per tablet Take 1 tablet by mouth daily.  20 tablet  0  . sertraline (ZOLOFT) 100 MG tablet Take 1/2 by mouth daily as needed      . Tamsulosin HCl (FLOMAX) 0.4 MG CAPS Take by mouth at bedtime.        [name] tiotropium (SPIRIVA HANDIHALER) 18 MCG inhalation capsule Place 18 mcg into inhaler and inhale daily.         No current facility-administered medications for this visit.    Past Medical History  Diagnosis Date  . THRUSH 11/06/2009  . HYPOTHYROIDISM 07/30/2009  . DIABETES MELLITUS, TYPE II 11/23/2009  . DEPRESSION 11/23/2009  . 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 HYPERTENSION 07/30/2009  .  CORONARY ARTERY DISEASE 11/23/2009  . CHRONIC OBSTRUCTIVE PULMONARY DISEASE, ACUTE EXACERBATION 03/01/2010  . C O P D 07/30/2009  . 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 RESPIRATORY FAILURE, CHRONIC 07/31/2009  . BENIGN PROSTATIC HYPERTROPHY 11/23/2009  . DEGENERATIVE JOINT DISEASE 11/23/2009  . 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 FATIGUE 11/23/2009  . 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 GAIT DISTURBANCE 12/10/2009  . DYSPNEA/SHORTNESS OF BREATH 12/08/2009  . HEMOPTYSIS UNSPECIFIED 05/07/2010  . RA (rheumatoid arthritis) 06/11/2011  . PTSD (post-traumatic stress disorder) 03/10/2012    Past Surgical History  Procedure Laterality Date  . Small bowel obstruction repair with adhesiolysis    . Hx of remote ileum resection due to bleeding    . Abdominal aortic aneurysm repair    . S/p back surgury   1972    lumbar laminectomy Dr. Fannie Knee  . S/p left femoral embolectomy with left leg ischemia      Dr. Hart [place], vascular  . S/p right ganglion cyst      Dr. Teressa Senter     No family history on file.  History   Social History  . Marital Status: Married    Spouse Name: N/A    Number of Children: N/A  . Years of Education: N/A   Occupational History  . disabled veteran, Ex Human resources officer    Social History Main Topics  . Smoking status: Former Smoker -- 2.50 packs/day for 40 years    Types: Cigarettes, Pipe, Cigars    Quit date: 07/28/1998  . Smokeless tobacco: Never Used     Comment: quit in 2000 x 40 yrs. 2.5 ppd  . Alcohol Use: No  . Drug Use: No  . Sexual Activity: Not on file   Other Topics Concern  . Not on file   Social History Narrative   Lives in the home with his wife   Sees [place] every 6 month for meds.   Denies asbestos exposure    Review of Systems:  All systems reviewed.  They are negative to the above problem except as previously stated.  Vital Signs: BP 165/73  Pulse 72  Ht 6\' 3"  (1.905 m)  Wt 259 lb (117.482 kg)  BMI 32.37 kg/m2  Physical Exam Patinet is in NAD HEENT:  Normocephalic, atraumatic. EOMI, PERRLA.  Neck: JVP is normal.  No bruits.  Lungs:Decreased airflow.   Heart: Regular rate and rhythm. Normal S1, S2. No S3.   No significant murmurs. PMI not displaced.  Abdomen:  Supple, nontender. Normal bowel sounds. No masses. No hepatomegaly.  Extremities:   Good distal pulses throughout. No lower extremity edema.  Musculoskeletal :moving all extremities.  Neuro:   alert and oriented x3.  CN II-XII grossly intact.  EKG  SB 54.  Occasional PAC  First degree AV block  PR 204 msec.  LBBB.   Assessment and Plan:  1.  CAD  Patinet with evid of CAD on CT  Has some L arm pain not clearly associated with activity. I would follow Will review results of lung function tests  I would not jump to stress testing. Dobutamine not recommm due to LBBB.  Lexiscan, with current lung function would not be without concern for problems    2.  COPD  Severe  3.  HL  I would push lipitor to 40 mg for tighter control of LDL  F/U lipids  in 8 wks with AST  4.  HTN  Did not take meds this AM  Will arrange for f/u in 6 months.

## 2013-12-23 ENCOUNTER — Ambulatory Visit (INDEPENDENT_AMBULATORY_CARE_PROVIDER_SITE_OTHER): Payer: Medicare Other | Admitting: Internal Medicine

## 2013-12-23 ENCOUNTER — Encounter: Payer: Self-pay | Admitting: Internal Medicine

## 2013-12-23 VITALS — BP 165/73 | HR 72 | Ht 75.0 in | Wt 259.0 lb

## 2013-12-23 DIAGNOSIS — Z79899 Other long term (current) drug therapy: Secondary | ICD-10-CM

## 2013-12-23 DIAGNOSIS — I10 Essential (primary) hypertension: Secondary | ICD-10-CM

## 2013-12-23 DIAGNOSIS — I2510 Atherosclerotic heart disease of native coronary artery without angina pectoris: Secondary | ICD-10-CM

## 2013-12-23 DIAGNOSIS — I447 Left bundle-branch block, unspecified: Secondary | ICD-10-CM

## 2013-12-23 MED ORDER — ATORVASTATIN CALCIUM 40 MG PO TABS: 40.0000 mg | ORAL_TABLET | Freq: Every day | ORAL | Status: DC

## 2013-12-23 NOTE — Patient Instructions (Signed)
Your physician has recommended you make the following change in your medication: INCREASE LIPITOR TO 40 MG ONCE A DAY  Your physician recommends that you return for lab work in: 8-10 WEEKS (LIPID PROFILE, AST)  Your physician wants you to follow-up in: 6 MONTHS WITH DR ROSS.  You will receive a reminder letter in the mail two months in advance. If you don't receive a letter, please call our office to schedule the follow-up appointment.

## 2013-12-27 ENCOUNTER — Ambulatory Visit (INDEPENDENT_AMBULATORY_CARE_PROVIDER_SITE_OTHER): Payer: Medicare Other | Admitting: Internal Medicine

## 2013-12-27 DIAGNOSIS — J8410 Pulmonary fibrosis, unspecified: Secondary | ICD-10-CM

## 2013-12-27 DIAGNOSIS — J449 Chronic obstructive pulmonary disease, unspecified: Secondary | ICD-10-CM

## 2013-12-27 NOTE — Progress Notes (Signed)
PFT done today. 

## 2013-12-30 LAB — PULMONARY FUNCTION TEST
DL/VA % pred: 39 %
DL/VA: 1.91 ml/min/mmHg/L
DLCO unc % pred: 26 %
DLCO unc: 10.13 ml/min/mmHg
FEF 25-75 PRE: 0.5 L/s
FEF 25-75 Post: 0.56 L/sec
FEF2575-%Change-Post: 11 %
FEF2575-%Pred-Post: 20 %
FEF2575-%Pred-Pre: 18 %
FEV1-%Change-Post: 9 %
FEV1-%Pred-Post: 40 %
FEV1-%Pred-Pre: 36 %
FEV1-Post: 1.48 L
FEV1-Pre: 1.36 L
FEV1FVC-%Change-Post: 9 %
FEV1FVC-%Pred-Pre: 61 %
FEV6-%Change-Post: 1 %
FEV6-%PRED-POST: 60 %
FEV6-%PRED-PRE: 59 %
FEV6-PRE: 2.84 L
FEV6-Post: 2.87 L
FEV6FVC-%Change-Post: 1 %
FEV6FVC-%PRED-PRE: 99 %
FEV6FVC-%Pred-Post: 100 %
FVC-%Change-Post: 0 %
FVC-%PRED-POST: 59 %
FVC-%PRED-PRE: 59 %
FVC-POST: 3.01 L
FVC-PRE: 3.01 L
POST FEV1/FVC RATIO: 49 %
POST FEV6/FVC RATIO: 95 %
Pre FEV1/FVC ratio: 45 %
Pre FEV6/FVC Ratio: 94 %
RV % pred: 80 %
RV: 2.2 L
TLC % pred: 80 %
TLC: 6.31 L

## 2014-01-05 ENCOUNTER — Encounter: Payer: Self-pay | Admitting: Emergency Medicine

## 2014-01-05 ENCOUNTER — Ambulatory Visit (INDEPENDENT_AMBULATORY_CARE_PROVIDER_SITE_OTHER): Payer: Medicare Other | Admitting: Emergency Medicine

## 2014-01-05 ENCOUNTER — Telehealth: Payer: Self-pay | Admitting: Internal Medicine

## 2014-01-05 VITALS — BP 160/92 | HR 75 | Ht 75.0 in | Wt 256.0 lb

## 2014-01-05 DIAGNOSIS — E785 Hyperlipidemia, unspecified: Secondary | ICD-10-CM | POA: Insufficient documentation

## 2014-01-05 DIAGNOSIS — E1169 Type 2 diabetes mellitus with other specified complication: Secondary | ICD-10-CM | POA: Insufficient documentation

## 2014-01-05 DIAGNOSIS — J449 Chronic obstructive pulmonary disease, unspecified: Secondary | ICD-10-CM

## 2014-01-05 MED ORDER — AZITHROMYCIN 250 MG PO TABS: 250.0000 mg | ORAL_TABLET | Freq: Every day | ORAL | Status: DC

## 2014-01-05 MED ORDER — PREDNISONE 10 MG PO TABS: ORAL_TABLET | ORAL | Status: DC

## 2014-01-05 NOTE — Progress Notes (Signed)
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 He has a history of frequent exacerbations. Maintained on Foradil plus Asmanex plus Spiriva. He presents today with low energy, cough that started 3 days ago, sputum became dark 6/10. He is also more SOB, has needed SABA more frequently. He states that he has been having more flares, not sure why. He has been seen by Dr Tenny Craw with cardiology, increased his statin > has felt worse, listless. He had PFT performed 12/27/13 > FEV1 1.36L.    Past Medical History  Diagnosis Date  . THRUSH 11/06/2009  . HYPOTHYROIDISM 07/30/2009  . DIABETES MELLITUS, TYPE II 11/23/2009  . DEPRESSION 11/23/2009  . 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 HYPERTENSION 07/30/2009  . CORONARY ARTERY DISEASE 11/23/2009  . CHRONIC OBSTRUCTIVE PULMONARY DISEASE, ACUTE EXACERBATION 03/01/2010  . C O P D 07/30/2009  . 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 RESPIRATORY FAILURE, CHRONIC 07/31/2009  . BENIGN PROSTATIC HYPERTROPHY 11/23/2009  . DEGENERATIVE JOINT DISEASE 11/23/2009  . 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 FATIGUE 11/23/2009  . 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 GAIT DISTURBANCE 12/10/2009  . DYSPNEA/SHORTNESS OF BREATH 12/08/2009  . HEMOPTYSIS UNSPECIFIED 05/07/2010  . RA (rheumatoid arthritis) 06/11/2011  . PTSD (post-traumatic stress disorder) 03/10/2012     No family history on file.   History   Social History  . Marital Status: Married    Spouse Name: N/A    Number of Children: N/A  . Years of Education: N/A   Occupational History  . disabled veteran, Ex Human resources officer    Social History Main Topics  . Smoking status: Former Smoker -- 2.50 packs/day for 40 years    Types: Cigarettes, Pipe, Cigars    Quit date: 07/28/1998  . Smokeless tobacco: Never Used     Comment: quit in 2000 x 40 yrs. 2.5 ppd  . Alcohol Use: No  . Drug Use: No  .  Sexual Activity: Not on file   Other Topics Concern  . Not on file   Social History Narrative   Lives in the home with his wife   Sees [place] every 6 month for meds.   Denies asbestos exposure     No Known Allergies   Outpatient Prescriptions Prior to Visit  Medication Sig Dispense Refill  . albuterol (PROAIR HFA) 108 (90 BASE) MCG/ACT inhaler Inhale 2 puffs into the lungs every 4 (four) hours as needed. Shortness of breath  1 Inhaler  6  . albuterol (PROVENTIL) (2.5 MG/3ML) 0.083% nebulizer solution 1 vial in nebulizer every 6 hours as needed Dx 496  120 mL  6  . ALPRAZolam (XANAX) 0.5 MG tablet Take 0.5 mg by mouth at bedtime. For sleep and anxiety      . atorvastatin (LIPITOR) 40 MG tablet Take 1 tablet (40 mg total) by mouth daily.  90 tablet  3  . formoterol (FORADIL AEROLIZER) 12 MCG capsule for inhaler Inhale contents of 1 capsule two times a day       . gabapentin (NEURONTIN) 800 MG tablet Take 800 mg by mouth 2 (two) times daily.        . insulin aspart (NOVOLOG) 100 UNIT/ML injection 8-10 Units See admin instructions. Sliding scale as needed with meals (8-10 units at supper).      . insulin glargine (LANTUS) 100 UNIT/ML injection Inject 0.75 mLs (75 Units total) into the  skin at bedtime. Can increase slowly by 5 units daily upto 70units depending on blood sugars during this week  10 mL    . levothyroxine (SYNTHROID, LEVOTHROID) 25 MCG tablet Take 25 mcg by mouth daily.      [name] losartan (COZAAR) 50 MG tablet Take 1 tablet (50 mg total) by mouth daily.  30 tablet  0  . metFORMIN (GLUCOPHAGE) 1000 MG tablet Take 1,000 mg by mouth 2 (two) times daily.        . mometasone (ASMANEX 60 METERED DOSES) 220 MCG/INH inhaler Inhale 2 puffs into the lungs daily.        . Multiple Vitamins-Minerals (MEGA MULTIVITAMIN FOR MEN PO) Take by mouth daily.        . naphazoline (CLEAR EYES) 0.012 % ophthalmic solution Place 1 drop into both eyes as needed. Red eyes       . PLAQUENIL 200 MG tablet Take 1  tablet by mouth Daily.      [name] senna-docusate (SENOKOT-S) 8.6-50 MG per tablet Take 1 tablet by mouth daily.  20 tablet  0  . sertraline (ZOLOFT) 100 MG tablet Take 1/2 by mouth daily as needed      . Tamsulosin HCl (FLOMAX) 0.4 MG CAPS Take by mouth at bedtime.        [name] tiotropium (SPIRIVA HANDIHALER) 18 MCG inhalation capsule Place 18 mcg into inhaler and inhale daily.        [name] levofloxacin (LEVAQUIN) 500 MG tablet Take 1 tablet (500 mg total) by mouth daily.  5 tablet  0   No facility-administered medications prior to visit.   Filed Vitals:   01/05/14 1403  BP: 160/92  Pulse: 75  Height: 6\' 3"  (1.905 m)  Weight: 256 lb (116.121 kg)  SpO2: 92%   Gen: Pleasant, well-nourished, in no distress,  normal affect  ENT: No lesions,  mouth clear,  oropharynx clear, no postnasal drip  Neck: No JVD, no TMG, no carotid bruits  Lungs: No use of accessory muscles, no dullness to percussion, clear without rales or rhonchi  Cardiovascular: RRR, heart sounds normal, no murmur or gallops, no peripheral edema  Musculoskeletal: No deformities, no cyanosis or clubbing  Neuro: alert, non focal  Skin: Warm, no lesions or rashes   11/08/13 -  COMPARISON: Chest CT 07/19/2013.  FINDINGS:  Mediastinum: Heart size is borderline enlarged. There is no  significant pericardial fluid, thickening or pericardial  calcification. There is atherosclerosis of the thoracic aorta, the  great vessels of the mediastinum and the coronary arteries,  including calcified atherosclerotic plaque in the left anterior  descending, left circumflex and right coronary arteries. No  pathologically enlarged mediastinal or hilar lymph nodes. Please  note that accurate exclusion of hilar adenopathy is limited on  noncontrast CT scans. Esophagus is unremarkable in appearance.  Lungs/Pleura: Calcified pleural plaques throughout the right  hemithorax, similar to the prior examination from 05/08/2010. No  left-sided calcified or  noncalcified pleural plaques are identified.  Associated with the pleural plaques in the periphery of the right  lung there are areas of architectural distortion, in particular in  the right lower lobe there is extensive subpleural bulla and blebs.  The volume of the right hemithorax appears chronically decreased.  These findings are chronic and very similar to remote prior study  from 2011. High-resolution images demonstrate no frank subpleural  reticulation, parenchymal banding, traction bronchiectasis or  honeycombing to suggest an interstitial lung disease. Inspiratory  and expiratory imaging demonstrates evidence of mild  air trapping  throughout the lungs bilaterally, indicative of mild small airways  disease. No acute consolidative airspace disease. No pleural  effusions.  Upper Abdomen: Unremarkable.  Musculoskeletal: There are no aggressive appearing lytic or blastic  lesions noted in the visualized portions of the skeleton.  IMPRESSION:  1. Right-sided fibrothorax again noted, appearing very similar to  remote prior studies dating back to at least 05/08/2010.  2. No imaging findings in the lungs to suggests underlying  interstitial lung disease at this time.  3. Evidence of mild air trapping throughout the lungs bilaterally,  indicative of small airways disease.  4. Atherosclerosis, including 3 vessel coronary artery disease.  Assessment for potential risk factor modification, dietary therapy  or pharmacologic therapy may be warranted, if clinically indicated.   C O P D Sounds like evolving bronchitis, although no wheezing yet. He is high risk for a full exacerbation.  - will treat w azithro,  - give script for pred to fill if starts wheezing - rov MR   Other and unspecified hyperlipidemia His sx of weakness are non-specific but he relates in time to increase in atorvastatin. I asked him to go back to 20mg  and to then discuss any improvement with Dr Tenny Craw so they can decide  his final dosing.

## 2014-01-05 NOTE — Assessment & Plan Note (Signed)
Sounds like evolving bronchitis, although no wheezing yet. He is high risk for a full exacerbation.  - will treat w azithro,  - give script for pred to fill if starts wheezing - rov MR

## 2014-01-05 NOTE — Patient Instructions (Addendum)
Please take azithromycin for 5 days We will give a script for prednisone but do not fill this now. Use this if you begin to experience wheezing Continue your inhaled medications Try decreasing your Lipitor back down to 20mg  so that you can report back to Dr whether your weakness gets better.  Follow with Dr Tenny Craw as already scheduled.

## 2014-01-05 NOTE — Assessment & Plan Note (Signed)
His sx of weakness are non-specific but he relates in time to increase in atorvastatin. I asked him to go back to 20mg  and to then discuss any improvement with Dr so they can decide his final dosing.

## 2014-01-05 NOTE — Telephone Encounter (Signed)
Called spoke with pt. He is scheduled to come in and see RB this afternoon at 2:15. Nothing further needed

## 2014-01-09 ENCOUNTER — Ambulatory Visit (INDEPENDENT_AMBULATORY_CARE_PROVIDER_SITE_OTHER): Payer: Medicare Other | Admitting: Internal Medicine

## 2014-01-09 ENCOUNTER — Encounter: Payer: Self-pay | Admitting: Internal Medicine

## 2014-01-09 ENCOUNTER — Telehealth: Payer: Self-pay | Admitting: Internal Medicine

## 2014-01-09 VITALS — BP 150/78 | HR 56 | Ht 75.0 in | Wt 256.6 lb

## 2014-01-09 DIAGNOSIS — J441 Chronic obstructive pulmonary disease with (acute) exacerbation: Secondary | ICD-10-CM

## 2014-01-09 MED ORDER — PREDNISONE 10 MG PO TABS: ORAL_TABLET | ORAL | Status: DC

## 2014-01-09 NOTE — Progress Notes (Signed)
Subjective:    Patient ID: [name], male    DOB: 1941/06/26, 73 y.o.   MRN: [id number]  HPI    Ov 01/09/2014   Followup Gold stage 3 copd with chronic resp failure (does not have ILD on 2015 CT chest). Known RA. Ex-smoker  Last seen by Dr. Levy Pupa  01/05/2014. At that time he was in the beginning of his COPD exacerbation. He was given Z-Pak. For this his cough and yellow sputum have improved partially but he still continues to be very dyspneic. He did not start the prednisone burst that was prescribed to him and was to be used as directed. There is no associated fever, hemoptysis, chest pain, pedal edema.  Pulmonary function test June 2015 shows Gold stage III COPD which is unchanged from baseline the DLCO is 28% and significantly reduced. Her on all the DLCO compared to baseline.  Past medical history: He has seen Dr. Tenny Craw in cardiology for coronary artery calcification. Stressed test is being deferred and he is curious as to why. Lipitor was increased but he did not tolerate this.    Review of Systems  Constitutional: Negative for fever and unexpected weight change.  HENT: Positive for congestion. Negative for dental problem, ear pain, nosebleeds, postnasal drip, rhinorrhea, sinus pressure, sneezing, sore throat and trouble swallowing.   Eyes: Negative for redness and itching.  Respiratory: Positive for cough and shortness of breath. Negative for chest tightness and wheezing.   Cardiovascular: Negative for palpitations and leg swelling.  Gastrointestinal: Negative for nausea and vomiting.  Genitourinary: Negative for dysuria.  Musculoskeletal: Negative for joint swelling.  Skin: Negative for rash.  Neurological: Negative for headaches.  Hematological: Does not bruise/bleed easily.  Psychiatric/Behavioral: Negative for dysphoric mood. The patient is not nervous/anxious.        Objective:   Physical Exam  Filed Vitals:   01/09/14 1344  BP: 150/78  Pulse: 56   Height: 6\' 3"  (1.905 m)  Weight: 256 lb 9.6 oz (116.393 kg)  SpO2: 95%   Nursing note and vitals reviewed. Constitutional: He is oriented to person, place, and time. He appears well-developed and well-nourished. No distress.  HENT:  Head: Normocephalic and atraumatic.  Right Ear: External ear normal.  Left Ear: External ear normal.  Mouth/Throat: Oropharynx is clear and moist. No oropharyngeal exudate.  Eyes: Conjunctivae and EOM are normal. Pupils are equal, round, and reactive to light. Right eye exhibits no discharge. Left eye exhibits no discharge. No scleral icterus.  Neck: Normal range of motion. Neck supple. No JVD present. No tracheal deviation present. No thyromegaly present.  Cardiovascular: Normal rate, regular rhythm and intact distal pulses.  Exam reveals no gallop and no friction rub.   No murmur heard. Pulmonary/Chest: Effort normal and breath sounds normal. No respiratory distress. He has no wheezes. He has no rales. He exhibits no tenderness.  barrell chest . Chronic purse lip breathing + Abdominal: Soft. Bowel sounds are normal. He exhibits no distension and no mass. There is no tenderness. There is no rebound and no guarding.  Musculoskeletal: Normal range of motion. He exhibits no edema and no tenderness.  Lymphadenopathy:    He has no cervical adenopathy.  Neurological: He is alert and oriented to person, place, and time. He has normal reflexes. No cranial nerve deficit. Coordination normal.  Skin: Skin is warm and dry. No rash noted. He is not diaphoretic. No erythema. No pallor.  Psychiatric: He has a normal mood and affect. His behavior is  normal. Judgment and thought content normal.          Assessment & Plan:

## 2014-01-09 NOTE — Telephone Encounter (Signed)
HI [name]  Patient [name] was curious why stress test is being deferred?  ? Due to lack of typical symptoms  THanks  Dr. Kalman Shan, M.D., [facility].C.P Pulmonary and Critical Care Medicine Staff Physician [facility] Pager: [phone], If no answer or between  15:00h - 7:00h: call [phone]  01/09/2014 2:12 PM

## 2014-01-09 NOTE — Patient Instructions (Addendum)
#  COPD and chronic resp failure #COPD exacerbation Exacerbation only partially resolved Finish Z pak tomorrow STart prednisone 40 mg daily x 2 days, then 20mg  daily x 2 days, then 10mg  daily x 2 days, then 5mg  daily x 2 days and stop Continue oxygen and other copd meds as before Overall breathing test is stable  #COronary ARtery Calcification  - I will touch base with Dr  #followup  6 months or sooner if needed

## 2014-01-09 NOTE — Assessment & Plan Note (Signed)
#  COPD and chronic resp failure Your exacerbation is only partially resolved  PLAN Finish Z pak tomorrow STart prednisone 40 mg daily x 2 days, then 20mg  daily x 2 days, then 10mg  daily x 2 days, then 5mg  daily x 2 days and stop Continue oxygen and other copd meds as before Overall breathing test is stable  #COronary ARtery Calcification  - I will touch base with Dr  #followup  6 months or sooner if needed

## 2014-01-10 NOTE — Telephone Encounter (Signed)
I discussed with patient when he was in clinic  I was not convinced his symptoms represented active/increasing angina. Stress test not so easly  Dobutamine not indicated due to LBBB Lexiscan is not without some risk given degree of his lung disease. He is on statin May be able to push for CT angio but again, I was not convinced symptoms were  Cardiac in origin.

## 2014-01-10 NOTE — Telephone Encounter (Signed)
Ok got it. I will let him know. I will close encounter  Thanks  M

## 2014-01-16 ENCOUNTER — Telehealth: Payer: Self-pay | Admitting: Internal Medicine

## 2014-01-16 MED ORDER — LEVOFLOXACIN 500 MG PO TABS: 500.0000 mg | ORAL_TABLET | Freq: Every day | ORAL | Status: DC

## 2014-01-16 NOTE — Telephone Encounter (Signed)
Per OV 01/09/14; Patient Instructions      #COPD and chronic resp failure #COPD exacerbation Exacerbation only partially resolved Finish Z pak tomorrow STart prednisone 40 mg daily x 2 days, then 20mg  daily x 2 days, then 10mg  daily x 2 days, then 5mg  daily x 2 days and stop Continue oxygen and other copd meds as before Overall breathing test is stable #COronary ARtery Calcification  - I will touch base with Dr #followup  6 months or sooner if needed  --  Spoke with pt. C/o prod cough yellow phlem, wheezing at times, slight increase SOB w/ activity. Does not feel bad just doesn't;t want this to turn into something else. Pt thinks he needs a diff ABX. Please advise MR thanks  No Known Allergies

## 2014-01-16 NOTE — Telephone Encounter (Signed)
take levaquin 500mg  once daily  X 5 days  Also let him know that I d/w Dr the cardiologist: bedause his symptoms related to heart were not typical she held off on cardiac stress test.

## 2014-01-16 NOTE — Telephone Encounter (Signed)
Spoke with the pt's spouse and notified of recs per MR She verbalized understanding  Rx was sent to [facility]

## 2014-03-17 ENCOUNTER — Ambulatory Visit (INDEPENDENT_AMBULATORY_CARE_PROVIDER_SITE_OTHER): Payer: Medicare Other

## 2014-03-17 ENCOUNTER — Encounter: Payer: Self-pay | Admitting: Internal Medicine

## 2014-03-17 ENCOUNTER — Ambulatory Visit (INDEPENDENT_AMBULATORY_CARE_PROVIDER_SITE_OTHER): Payer: Medicare Other | Admitting: Internal Medicine

## 2014-03-17 VITALS — BP 138/80 | HR 57 | Temp 97.9°F | Wt 252.5 lb

## 2014-03-17 DIAGNOSIS — Z79899 Other long term (current) drug therapy: Secondary | ICD-10-CM

## 2014-03-17 DIAGNOSIS — Z0000 Encounter for general adult medical examination without abnormal findings: Secondary | ICD-10-CM

## 2014-03-17 DIAGNOSIS — Z125 Encounter for screening for malignant neoplasm of prostate: Secondary | ICD-10-CM

## 2014-03-17 DIAGNOSIS — I2510 Atherosclerotic heart disease of native coronary artery without angina pectoris: Secondary | ICD-10-CM

## 2014-03-17 DIAGNOSIS — E119 Type 2 diabetes mellitus without complications: Secondary | ICD-10-CM

## 2014-03-17 DIAGNOSIS — I447 Left bundle-branch block, unspecified: Secondary | ICD-10-CM

## 2014-03-17 DIAGNOSIS — Z23 Encounter for immunization: Secondary | ICD-10-CM

## 2014-03-17 DIAGNOSIS — I10 Essential (primary) hypertension: Secondary | ICD-10-CM

## 2014-03-17 DIAGNOSIS — H15003 Unspecified scleritis, bilateral: Secondary | ICD-10-CM

## 2014-03-17 HISTORY — DX: Unspecified scleritis, bilateral: H15.003

## 2014-03-17 LAB — CBC WITH DIFFERENTIAL/PLATELET
BASOS PCT: 0.6 % (ref 0.0–3.0)
Basophils Absolute: 0 10*3/uL (ref 0.0–0.1)
EOS ABS: 0.3 10*3/uL (ref 0.0–0.7)
Eosinophils Relative: 4 % (ref 0.0–5.0)
HEMATOCRIT: 37.7 % — AB (ref 39.0–52.0)
HEMOGLOBIN: 12.3 g/dL — AB (ref 13.0–17.0)
LYMPHS ABS: 1.4 10*3/uL (ref 0.7–4.0)
LYMPHS PCT: 20 % (ref 12.0–46.0)
MCHC: 32.6 g/dL (ref 30.0–36.0)
MCV: 83.5 fl (ref 78.0–100.0)
Monocytes Absolute: 0.6 10*3/uL (ref 0.1–1.0)
Monocytes Relative: 9.5 % (ref 3.0–12.0)
Neutro Abs: 4.5 10*3/uL (ref 1.4–7.7)
Neutrophils Relative %: 65.9 % (ref 43.0–77.0)
Platelets: 151 10*3/uL (ref 150.0–400.0)
RBC: 4.52 Mil/uL (ref 4.22–5.81)
RDW: 14.7 % (ref 11.5–15.5)
WBC: 6.8 10*3/uL (ref 4.0–10.5)

## 2014-03-17 LAB — HEPATIC FUNCTION PANEL
ALK PHOS: 64 U/L (ref 39–117)
ALT: 23 U/L (ref 0–53)
AST: 25 U/L (ref 0–37)
Albumin: 4.1 g/dL (ref 3.5–5.2)
BILIRUBIN DIRECT: 0.1 mg/dL (ref 0.0–0.3)
BILIRUBIN TOTAL: 0.5 mg/dL (ref 0.2–1.2)
TOTAL PROTEIN: 6.7 g/dL (ref 6.0–8.3)

## 2014-03-17 LAB — URINALYSIS, ROUTINE W REFLEX MICROSCOPIC
Bilirubin Urine: NEGATIVE
HGB URINE DIPSTICK: NEGATIVE
Ketones, ur: NEGATIVE
Leukocytes, UA: NEGATIVE
Nitrite: NEGATIVE
Specific Gravity, Urine: 1.015 (ref 1.000–1.030)
TOTAL PROTEIN, URINE-UPE24: NEGATIVE
URINE GLUCOSE: 100 — AB
Urobilinogen, UA: 1 (ref 0.0–1.0)
pH: 6 (ref 5.0–8.0)

## 2014-03-17 LAB — BASIC METABOLIC PANEL
BUN: 16 mg/dL (ref 6–23)
CHLORIDE: 100 meq/L (ref 96–112)
CO2: 31 mEq/L (ref 19–32)
Calcium: 9.1 mg/dL (ref 8.4–10.5)
Creatinine, Ser: 0.9 mg/dL (ref 0.4–1.5)
GFR: 91.54 mL/min (ref >60.00)
Glucose, Bld: 176 mg/dL — ABNORMAL HIGH (ref 70–99)
POTASSIUM: 4.6 meq/L (ref 3.5–5.1)
Sodium: 139 mEq/L (ref 135–145)

## 2014-03-17 LAB — LIPID PANEL
CHOLESTEROL: 141 mg/dL (ref 0–200)
HDL: 29.1 mg/dL — ABNORMAL LOW (ref >39.00)
NONHDL: 111.9
Total CHOL/HDL Ratio: 5
Triglycerides: 236 mg/dL — ABNORMAL HIGH (ref 0.0–149.0)
VLDL: 47.2 mg/dL — AB (ref 0.0–40.0)

## 2014-03-17 LAB — MICROALBUMIN / CREATININE URINE RATIO
Creatinine,U: 141.5 mg/dL
MICROALB/CREAT RATIO: 2.3 mg/g (ref 0.0–30.0)
Microalb, Ur: 3.3 mg/dL — ABNORMAL HIGH (ref 0.0–1.9)

## 2014-03-17 LAB — AST: AST: 25 U/L (ref 0–37)

## 2014-03-17 LAB — HEMOGLOBIN A1C: HEMOGLOBIN A1C: 8.3 % — AB (ref 4.6–6.5)

## 2014-03-17 NOTE — Progress Notes (Signed)
Pre visit review using our clinic review tool, if applicable. No additional management support is needed unless otherwise documented below in the visit note. 

## 2014-03-17 NOTE — Assessment & Plan Note (Signed)

## 2014-03-17 NOTE — Patient Instructions (Addendum)
You had the flu shot today  Please continue all other medications as before, and refills have been done if requested.  Please have the pharmacy call with any other refills you may need.  Please continue your efforts at being more active, low cholesterol diet, and weight control.  You are otherwise up to date with prevention measures today.  Please keep your appointments with your specialists as you may have planned  Please go to the LAB in the Basement (turn left off the elevator) for the tests to be done today  You will be contacted by phone if any changes need to be made immediately.  Otherwise, you will receive a letter about your results with an explanation, but please check with MyChart first.  Please remember to sign up for MyChart if you have not done so, as this will be important to you in the future with finding out test results, communicating by private email, and scheduling acute appointments online when needed.  Please return in 6 months, or sooner if needed 

## 2014-03-17 NOTE — Progress Notes (Signed)
Subjective:    Patient ID: [name], male    DOB: [birth year], 73 y.o.   MRN: [id number]   HPI   Here for wellness and f/u;  Overall doing ok;  Pt denies CP, worsening SOB, DOE, wheezing, orthopnea, PND, worsening LE edema, palpitations, dizziness or syncope.  Pt denies neurological change such as new headache, facial or extremity weakness.  Pt denies polydipsia, polyuria, or low sugar symptoms. Pt states overall good compliance with treatment and medications, good tolerability, and has been trying to follow lower cholesterol diet.  Pt denies worsening depressive symptoms, suicidal ideation or panic. No fever, night sweats, wt loss, loss of appetite, or other constitutional symptoms.  Pt states good ability with ADL's, has low fall risk, home safety reviewed and adequate, no other significant changes in hearing or vision, and only occasionally active with exercise.  On chronic home o2 3L [place]. No current complaints. Sees [facility] op[facility] for scleritis as well, related to RA.  Does have ? Worsening LE general weakness but willing to continue the increased statin per cardiology Past Medical History  Diagnosis Date  . THRUSH 11/06/2009  . HYPOTHYROIDISM 07/30/2009  . DIABETES MELLITUS, TYPE II 11/23/2009  . DEPRESSION 11/23/2009  . 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 HYPERTENSION 07/30/2009  . CORONARY ARTERY DISEASE 11/23/2009  . CHRONIC OBSTRUCTIVE PULMONARY DISEASE, ACUTE EXACERBATION 03/01/2010  . C O P D 07/30/2009  . 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 RESPIRATORY FAILURE, CHRONIC 07/31/2009  . BENIGN PROSTATIC HYPERTROPHY 11/23/2009  . DEGENERATIVE JOINT DISEASE 11/23/2009  . 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 FATIGUE 11/23/2009  . 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 GAIT DISTURBANCE 12/10/2009  . DYSPNEA/SHORTNESS OF BREATH 12/08/2009  . HEMOPTYSIS UNSPECIFIED 05/07/2010  . RA (rheumatoid arthritis) 06/11/2011  . PTSD (post-traumatic stress disorder) 03/10/2012   Past Surgical History  Procedure Laterality  Date  . Small bowel obstruction repair with adhesiolysis    . Hx of remote ileum resection due to bleeding    . Abdominal aortic aneurysm repair    . S/p back surgury   1972    lumbar laminectomy Dr. Fannie Knee  . S/p left femoral embolectomy with left leg ischemia      Dr. Hart [place], vascular  . S/p right ganglion cyst      Dr. Teressa Senter    reports that he quit smoking about 15 years ago. His smoking use included Cigarettes, Pipe, and Cigars. He has a 100 pack-year smoking history. He has never used smokeless tobacco. He reports that he does not drink alcohol or use illicit drugs. family history is not on file. No Known Allergies Current Outpatient Prescriptions on File Prior to Visit  Medication Sig Dispense Refill  . albuterol (PROAIR HFA) 108 (90 BASE) MCG/ACT inhaler Inhale 2 puffs into the lungs every 4 (four) hours as needed. Shortness of breath  1 Inhaler  6  . albuterol (PROVENTIL) (2.5 MG/3ML) 0.083% nebulizer solution 1 vial in nebulizer every 6 hours as needed Dx 496  120 mL  6  . ALPRAZolam (XANAX) 0.5 MG tablet Take 0.5 mg by mouth at bedtime. For sleep and anxiety      . atorvastatin (LIPITOR) 40 MG tablet Take 1 tablet (40 mg total) by mouth daily.  90 tablet  3  . azithromycin (ZITHROMAX) 250 MG tablet Take 1 tablet (250 mg total) by mouth daily.  6 tablet  0  . formoterol (FORADIL AEROLIZER) 12 MCG capsule for inhaler Inhale contents of 1 capsule two times a day       .  gabapentin (NEURONTIN) 800 MG tablet Take 800 mg by mouth 2 (two) times daily.        . insulin aspart (NOVOLOG) 100 UNIT/ML injection 8-10 Units See admin instructions. Sliding scale as needed with meals (8-10 units at supper).      . insulin glargine (LANTUS) 100 UNIT/ML injection Inject 0.75 mLs (75 Units total) into the skin at bedtime. Can increase slowly by 5 units daily upto 70units depending on blood sugars during this week  10 mL    . levofloxacin (LEVAQUIN) 500 MG tablet Take 1 tablet (500 mg total) by mouth  daily.  5 tablet  0  . levothyroxine (SYNTHROID, LEVOTHROID) 25 MCG tablet Take 25 mcg by mouth daily.      [name] losartan (COZAAR) 50 MG tablet Take 1 tablet (50 mg total) by mouth daily.  30 tablet  0  . metFORMIN (GLUCOPHAGE) 1000 MG tablet Take 1,000 mg by mouth 2 (two) times daily.        . mometasone (ASMANEX 60 METERED DOSES) 220 MCG/INH inhaler Inhale 2 puffs into the lungs daily.        . Multiple Vitamins-Minerals (MEGA MULTIVITAMIN FOR MEN PO) Take by mouth daily.        . naphazoline (CLEAR EYES) 0.012 % ophthalmic solution Place 1 drop into both eyes as needed. Red eyes       . PLAQUENIL 200 MG tablet Take 1 tablet by mouth Daily.      . predniSONE (DELTASONE) 10 MG tablet 40 mg daily x 2 days, then 20mg  daily x 2 days, then 10mg  daily x 2 days, then 5mg  daily x 2 days and stop  15 tablet  0  . senna-docusate (SENOKOT-S) 8.6-50 MG per tablet Take 1 tablet by mouth daily.  20 tablet  0  . sertraline (ZOLOFT) 100 MG tablet Take 1/2 by mouth daily as needed      . Tamsulosin HCl (FLOMAX) 0.4 MG CAPS Take by mouth at bedtime.        tiotropium (SPIRIVA HANDIHALER) 18 MCG inhalation capsule Place 18 mcg into inhaler and inhale daily.         No current facility-administered medications on file prior to visit.   [facility]   Review of Systems Constitutional: Negative for increased diaphoresis, other activity, appetite or other siginficant weight change  HENT: Negative for worsening hearing loss, ear pain, facial swelling, mouth sores and neck stiffness.   Eyes: Negative for other worsening pain, redness or visual disturbance.  Respiratory: Negative for shortness of breath and wheezing.   Cardiovascular: Negative for chest pain and palpitations.  Gastrointestinal: Negative for diarrhea, blood in stool, abdominal distention or other pain Genitourinary: Negative for hematuria, flank pain or change in urine volume.  Musculoskeletal: Negative for myalgias or other joint  complaints.  Skin: Negative for color change and wound.  Neurological: Negative for syncope and numbness. other than noted Hematological: Negative for adenopathy. or other swelling Psychiatric/Behavioral: Negative for hallucinations, self-injury, decreased concentration or other worsening agitation.      Objective:   Physical Exam BP 138/80  Pulse 57  Temp(Src) 97.9 F (36.6 C) (Oral)  Wt 252 lb 8 oz (114.533 kg)  SpO2 95% VS noted,  Constitutional: Pt is oriented to person, place, and time. Appears well-developed and well-nourished.  Head: Normocephalic and atraumatic.  Right Ear: External ear normal.  Left Ear: External ear normal.  Nose: Nose normal.  Mouth/Throat: Oropharynx is clear and moist.  Eyes: Conjunctivae and EOM are normal. Pupils are equal, round, and reactive to light.  Neck: Normal range of motion. Neck supple. No JVD present. No tracheal deviation present.  Cardiovascular: Normal rate, regular rhythm, normal heart sounds and intact distal pulses.   Pulmonary/Chest: Effort normal and breath sounds without rales or wheezing  Abdominal: Soft. Bowel sounds are normal. NT. No HSM  Musculoskeletal: Normal range of motion. Exhibits no edema.  Lymphadenopathy:  Has no cervical adenopathy.  Neurological: Pt is alert and oriented to person, place, and time. Pt has normal reflexes. No cranial nerve deficit. Motor grossly intact Skin: Skin is warm and dry. No rash noted.  Psychiatric:  Has normal mood and affect. Behavior is normal.     Assessment & Plan:

## 2014-03-18 LAB — TSH: TSH: 0.69 u[IU]/mL (ref 0.35–4.50)

## 2014-03-18 LAB — PSA: PSA: 1.27 ng/mL (ref 0.10–4.00)

## 2014-03-18 LAB — LDL CHOLESTEROL, DIRECT: LDL DIRECT: 72.2 mg/dL

## 2014-03-23 ENCOUNTER — Telehealth: Payer: Self-pay | Admitting: Internal Medicine

## 2014-03-23 NOTE — Telephone Encounter (Signed)
New message      Returning a nurses call.  He said Dr Tenny Craw is supposed to talk to Dr Marchelle Gearing and decide if he needs to see Dr Tenny Craw again or be referred to another doctor.  He is not sure who called him from here.

## 2014-03-27 NOTE — Telephone Encounter (Signed)
lmtcb

## 2014-03-27 NOTE — Telephone Encounter (Signed)
Message     I spoke to Dr Marchelle Gearing earlier this summer Plan was to continue medical Rx I would f/u in November in clinic       Pt has f/u scheduled in November.

## 2014-04-05 ENCOUNTER — Telehealth: Payer: Self-pay | Admitting: *Deleted

## 2014-04-05 NOTE — Telephone Encounter (Signed)
Reviewed lipid results with patient. He reports that his PCP reduced his Lipitor from 40 mg to 10 mg about a month and a half ago due to feeling "bad".  Remains on Lipitor 10 mg at this time. Feels good now, other than COPD exacerbations.  States this is treated with steroids which makes it difficult to control his blood sugar.

## 2014-04-14 DIAGNOSIS — H15823 Localized anterior staphyloma, bilateral: Secondary | ICD-10-CM | POA: Insufficient documentation

## 2014-04-14 DIAGNOSIS — H04123 Dry eye syndrome of bilateral lacrimal glands: Secondary | ICD-10-CM | POA: Insufficient documentation

## 2014-05-03 ENCOUNTER — Encounter: Payer: Self-pay | Admitting: Internal Medicine

## 2014-05-04 ENCOUNTER — Encounter: Payer: Self-pay | Admitting: Family

## 2014-05-04 ENCOUNTER — Other Ambulatory Visit: Payer: Medicare Other

## 2014-05-04 ENCOUNTER — Ambulatory Visit (INDEPENDENT_AMBULATORY_CARE_PROVIDER_SITE_OTHER): Payer: Medicare Other | Admitting: Family

## 2014-05-04 VITALS — BP 160/90 | HR 70 | Temp 97.4°F | Ht 75.0 in | Wt 252.4 lb

## 2014-05-04 DIAGNOSIS — R300 Dysuria: Secondary | ICD-10-CM

## 2014-05-04 LAB — POCT URINALYSIS DIPSTICK
Blood, UA: NEGATIVE
GLUCOSE UA: NEGATIVE
Ketones, UA: NEGATIVE
NITRITE UA: NEGATIVE
Spec Grav, UA: 1.015
UROBILINOGEN UA: 0.2
pH, UA: 6.5

## 2014-05-04 MED ORDER — LEVOFLOXACIN 500 MG PO TABS: 500.0000 mg | ORAL_TABLET | Freq: Every day | ORAL | Status: DC

## 2014-05-04 NOTE — Assessment & Plan Note (Signed)
Urine with trace amount of leukocytes. Will send for culture. Start levoquin and await results of urine culture for sensitivity. Instructed to follow up if symptoms worsen or fail to improve.

## 2014-05-04 NOTE — Telephone Encounter (Signed)
tammy to call pt, see above  Ok to work in

## 2014-05-04 NOTE — Progress Notes (Signed)
Subjective:    Patient ID: [name], male    DOB: 1941/04/30, 73 y.o.   MRN: [id number]  HPI:  [name] is a 73 y.o. male who presents today for symptoms of a urinary tract infection. Indicates he began feeling urinary frequency and burning about a week ago. Has not tried any treatments to alleviate the symptoms. Notes that he previously had similar symptoms about a year ago. Denies any current fevers, chills, or night sweats. Denies any penile discharge or recent unprotected intercourse. Still continues to use his medication without problems.   No Known Allergies  Past Medical History  Diagnosis Date  . THRUSH 11/06/2009  . HYPOTHYROIDISM 07/30/2009  . DIABETES MELLITUS, TYPE II 11/23/2009  . DEPRESSION 11/23/2009  . 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 HYPERTENSION 07/30/2009  . CORONARY ARTERY DISEASE 11/23/2009  . CHRONIC OBSTRUCTIVE PULMONARY DISEASE, ACUTE EXACERBATION 03/01/2010  . C O P D 07/30/2009  . 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 RESPIRATORY FAILURE, CHRONIC 07/31/2009  . BENIGN PROSTATIC HYPERTROPHY 11/23/2009  . DEGENERATIVE JOINT DISEASE 11/23/2009  . 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 FATIGUE 11/23/2009  . 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 GAIT DISTURBANCE 12/10/2009  . DYSPNEA/SHORTNESS OF BREATH 12/08/2009  . HEMOPTYSIS UNSPECIFIED 05/07/2010  . RA (rheumatoid arthritis) 06/11/2011  . PTSD (post-traumatic stress disorder) 03/10/2012  . Scleritis of both eyes 03/17/2014   Current Outpatient Prescriptions on File Prior to Visit  Medication Sig Dispense Refill  . albuterol (PROAIR HFA) 108 (90 BASE) MCG/ACT inhaler Inhale 2 puffs into the lungs every 4 (four) hours as needed. Shortness of breath  1 Inhaler  6  . albuterol (PROVENTIL) (2.5 MG/3ML) 0.083% nebulizer solution 1 vial in nebulizer every 6 hours as needed Dx 496  120 mL  6  . ALPRAZolam (XANAX) 0.5 MG tablet Take 0.5 mg by mouth at bedtime. For sleep and anxiety      . atorvastatin (LIPITOR) 40 MG tablet  Take 10 mg by mouth daily.      . formoterol (FORADIL AEROLIZER) 12 MCG capsule for inhaler Inhale contents of 1 capsule two times a day       . gabapentin (NEURONTIN) 800 MG tablet Take 800 mg by mouth 2 (two) times daily.        . insulin aspart (NOVOLOG) 100 UNIT/ML injection 8-10 Units See admin instructions. Sliding scale as needed with meals (8-10 units at supper).      . insulin glargine (LANTUS) 100 UNIT/ML injection Inject 0.75 mLs (75 Units total) into the skin at bedtime. Can increase slowly by 5 units daily upto 70units depending on blood sugars during this week  10 mL    . levothyroxine (SYNTHROID, LEVOTHROID) 25 MCG tablet Take 25 mcg by mouth daily.      [name] losartan (COZAAR) 50 MG tablet Take 1 tablet (50 mg total) by mouth daily.  30 tablet  0  . metFORMIN (GLUCOPHAGE) 1000 MG tablet Take 1,000 mg by mouth 2 (two) times daily.        . mometasone (ASMANEX 60 METERED DOSES) 220 MCG/INH inhaler Inhale 2 puffs into the lungs daily.        . Multiple Vitamins-Minerals (MEGA MULTIVITAMIN FOR MEN PO) Take by mouth daily.        . naphazoline (CLEAR EYES) 0.012 % ophthalmic solution Place 1 drop into both eyes as needed. Red eyes       . PLAQUENIL 200 MG tablet Take 1 tablet by mouth Daily.      [name]  senna-docusate (SENOKOT-S) 8.6-50 MG per tablet Take 1 tablet by mouth daily.  20 tablet  0  . sertraline (ZOLOFT) 100 MG tablet Take 1/2 by mouth daily as needed      . Tamsulosin HCl (FLOMAX) 0.4 MG CAPS Take by mouth at bedtime.        [name] tiotropium (SPIRIVA HANDIHALER) 18 MCG inhalation capsule Place 18 mcg into inhaler and inhale daily.         No current facility-administered medications on file prior to visit.    Review of Systems     See HPI.  Objective:     BP 160/90  Pulse 70  Temp(Src) 97.4 F (36.3 C) (Oral)  Ht 6\' 3"  (1.905 m)  Wt 252 lb 6.4 oz (114.488 kg)  BMI 31.55 kg/m2  SpO2 92% Nursing note and vital signs reviewed.  Physical Exam  Constitutional: He is  oriented to person, place, and time. He appears well-developed and well-nourished. No distress.  Seated in a chair wearing oxygen  Cardiovascular: Normal rate, regular rhythm and normal heart sounds.   Pulmonary/Chest: Effort normal and breath sounds normal.  Neurological: He is alert and oriented to person, place, and time.  Skin: Skin is warm and dry.  Psychiatric: He has a normal mood and affect. His behavior is normal. Judgment and thought content normal.        Assessment & Plan:

## 2014-05-04 NOTE — Addendum Note (Signed)
Addended by: Peter Congo T on: 05/04/2014 03:50 PM   Modules accepted: Orders

## 2014-05-04 NOTE — Patient Instructions (Signed)
Thank you for choosing [facility].  Summary/Instructions:  You most likely have a urinary tract infection. Please start taking the Levaquin. We have cultured your urine and should have the results tomorrow. If your symptoms fail to improve or worsen, please contact the office.

## 2014-05-04 NOTE — Progress Notes (Signed)
Pre visit review using our clinic review tool, if applicable. No additional management support is needed unless otherwise documented below in the visit note. 

## 2014-05-05 LAB — URINE CULTURE
Colony Count: NO GROWTH
Organism ID, Bacteria: NO GROWTH

## 2014-06-04 NOTE — Progress Notes (Signed)
HPI Patient is a 73 yo is referred for ischemic evaluation.  He is followed in pulm clinic for COPD  CT of chest showed coronary calcifiations. The patient has had 2 heart caths in past.  One in early 2000s ([facility] showed one uclerated plaque.  Rec medical Rx.  Second was at [facility] he had no significant CAD  The patient was last in clinic in May 2015\\  Since seen he has been fairly stable from a breathing standpoint.  Denies signif arm issue He has been taking lasix intermittently if he thinks his fluid is up  He thinks this has helped him not have flairs in his breathing    No Known Allergies  Current Outpatient Prescriptions  Medication Sig Dispense Refill  . albuterol (PROAIR HFA) 108 (90 BASE) MCG/ACT inhaler Inhale 2 puffs into the lungs every 4 (four) hours as needed. Shortness of breath 1 Inhaler 6  . albuterol (PROVENTIL) (2.5 MG/3ML) 0.083% nebulizer solution 1 vial in nebulizer every 6 hours as needed Dx 496 120 mL 6  . ALPRAZolam (XANAX) 0.5 MG tablet Take 0.5 mg by mouth at bedtime. For sleep and anxiety    . atorvastatin (LIPITOR) 40 MG tablet Take 10 mg by mouth daily.    . formoterol (FORADIL AEROLIZER) 12 MCG capsule for inhaler Inhale contents of 1 capsule two times a day     . gabapentin (NEURONTIN) 800 MG tablet Take 800 mg by mouth 2 (two) times daily.      . insulin aspart (NOVOLOG) 100 UNIT/ML injection 8-10 Units See admin instructions. Sliding scale as needed with meals (8-10 units at supper).    . insulin glargine (LANTUS) 100 UNIT/ML injection Inject 0.75 mLs (75 Units total) into the skin at bedtime. Can increase slowly by 5 units daily upto 70units depending on blood sugars during this week 10 mL   . levofloxacin (LEVAQUIN) 500 MG tablet Take 1 tablet (500 mg total) by mouth daily. 10 tablet 0  . levothyroxine (SYNTHROID, LEVOTHROID) 25 MCG tablet Take 25 mcg by mouth daily.    . Levothyroxine Sodium (SYNTHROID PO) Take 0.2 mg by mouth daily. Pt states that  he takes this medication that the [place] has given him.    [name] LORazepam (ATIVAN) 2 MG tablet Take 2 mg by mouth daily.  0  . losartan (COZAAR) 50 MG tablet Take 1 tablet (50 mg total) by mouth daily. 30 tablet 0  . metFORMIN (GLUCOPHAGE) 1000 MG tablet Take 1,000 mg by mouth 2 (two) times daily.      . mometasone (ASMANEX 60 METERED DOSES) 220 MCG/INH inhaler Inhale 2 puffs into the lungs daily.      . Multiple Vitamins-Minerals (MEGA MULTIVITAMIN FOR MEN PO) Take by mouth daily.      . naphazoline (CLEAR EYES) 0.012 % ophthalmic solution Place 1 drop into both eyes as needed. Red eyes     . PLAQUENIL 200 MG tablet Take 1 tablet by mouth Daily.    [name] senna-docusate (SENOKOT-S) 8.6-50 MG per tablet Take 1 tablet by mouth daily. 20 tablet 0  . sertraline (ZOLOFT) 100 MG tablet Take 1/2 by mouth daily as needed    . Tamsulosin HCl (FLOMAX) 0.4 MG CAPS Take by mouth at bedtime.      [name] tiotropium (SPIRIVA HANDIHALER) 18 MCG inhalation capsule Place 18 mcg into inhaler and inhale daily.       No current facility-administered medications for this visit.    Past Medical History  Diagnosis Date  . THRUSH 11/06/2009  . HYPOTHYROIDISM 07/30/2009  . DIABETES MELLITUS, TYPE II 11/23/2009  . DEPRESSION 11/23/2009  . 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 HYPERTENSION 07/30/2009  . CORONARY ARTERY DISEASE 11/23/2009  . CHRONIC OBSTRUCTIVE PULMONARY DISEASE, ACUTE EXACERBATION 03/01/2010  . C O P D 07/30/2009  . 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 RESPIRATORY FAILURE, CHRONIC 07/31/2009  . BENIGN PROSTATIC HYPERTROPHY 11/23/2009  . DEGENERATIVE JOINT DISEASE 11/23/2009  . 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 FATIGUE 11/23/2009  . 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 GAIT DISTURBANCE 12/10/2009  . DYSPNEA/SHORTNESS OF BREATH 12/08/2009  . HEMOPTYSIS UNSPECIFIED 05/07/2010  . RA (rheumatoid arthritis) 06/11/2011  . PTSD (post-traumatic stress disorder) 03/10/2012  . Scleritis of both eyes 03/17/2014    Past Surgical History   Procedure Laterality Date  . Small bowel obstruction repair with adhesiolysis    . Hx of remote ileum resection due to bleeding    . Abdominal aortic aneurysm repair    . S/p back surgury   1972    lumbar laminectomy Dr. Fannie Knee  . S/p left femoral embolectomy with left leg ischemia      Dr. Hart [place], vascular  . S/p right ganglion cyst      Dr. Teressa Senter    No family history on file.  History   Social History  . Marital Status: Married    Spouse Name: N/A    Number of Children: N/A  . Years of Education: N/A   Occupational History  . disabled veteran, Ex Human resources officer    Social History Main Topics  . Smoking status: Former Smoker -- 2.50 packs/day for 40 years    Types: Cigarettes, Pipe, Cigars    Quit date: 07/28/1998  . Smokeless tobacco: Never Used     Comment: quit in 2000 x 40 yrs. 2.5 ppd  . Alcohol Use: No  . Drug Use: No  . Sexual Activity: Not on file   Other Topics Concern  . Not on file   Social History Narrative   Lives in the home with his wife   Sees [place] every 6 month for meds.   Denies asbestos exposure    Review of Systems:  All systems reviewed.  They are negative to the above problem except as previously stated.  Vital Signs: BP 120/60 mmHg  Pulse 66  Ht 6\' 3"  (1.905 m)  Wt 256 lb 1.9 oz (116.175 kg)  BMI 32.01 kg/m2  Physical Exam [place] is in NAD HEENT:  Normocephalic, atraumatic. EOMI, PERRLA.  Neck: JVP is normal.  No bruits.  Lungs:Decreased airflow.   Heart: Regular rate and rhythm. Normal S1, S2. No S3.   No significant murmurs. PMI not displaced.  Abdomen:  Supple, nontender. Normal bowel sounds. No masses. No hepatomegaly.  Extremities:   Good distal pulses throughout. No lower extremity edema.  Musculoskeletal :moving all extremities.  Neuro:   alert and oriented x3.  CN II-XII grossly intact.  Assessment and Plan:  1.  CAD  Patient denies signif symptoms.  COntinue meds    2.  COPD  Severe  3.  HL  LDL under well control     4.  HTN  Good control    Will arrange for f/u in 6 months.

## 2014-06-05 ENCOUNTER — Ambulatory Visit (INDEPENDENT_AMBULATORY_CARE_PROVIDER_SITE_OTHER): Payer: Medicare Other | Admitting: Internal Medicine

## 2014-06-05 ENCOUNTER — Encounter: Payer: Self-pay | Admitting: Internal Medicine

## 2014-06-05 VITALS — BP 120/60 | HR 66 | Ht 75.0 in | Wt 256.1 lb

## 2014-06-05 DIAGNOSIS — J42 Unspecified chronic bronchitis: Secondary | ICD-10-CM

## 2014-06-05 DIAGNOSIS — E785 Hyperlipidemia, unspecified: Secondary | ICD-10-CM

## 2014-06-05 DIAGNOSIS — I10 Essential (primary) hypertension: Secondary | ICD-10-CM

## 2014-06-05 DIAGNOSIS — I25709 Atherosclerosis of coronary artery bypass graft(s), unspecified, with unspecified angina pectoris: Secondary | ICD-10-CM

## 2014-06-05 NOTE — Patient Instructions (Addendum)
Your physician recommends that you continue on your current medications as directed. Please refer to the Current Medication list given to you today. Your physician wants you to follow-up in: June 2016.  You will receive a reminder letter in the mail two months in advance. If you don't receive a letter, please call our office to schedule the follow-up appointment.

## 2014-06-06 NOTE — Progress Notes (Signed)
HPI Patient is a 73 yo is referred for ischemic evaluation.  He is followed in pulm clinic for COPD  CT of chest showed coronary calcifiations. The patient has had 2 heart caths in past.  One in early 2000s ([facility] [place] showed one uclerated plaque.  Rec medical Rx.  Second was at [facility] he had no significant CAD  The patient was last in clinic in May 2015\\  Since seen he has been fairly stable from a breathing standpoint.  Denies signif arm issue He has been taking lasix intermittently if he thinks his fluid is up  He thinks this has helped him not have flairs in his breathing    No Known Allergies  Current Outpatient Prescriptions  Medication Sig Dispense Refill  . albuterol (PROAIR HFA) 108 (90 BASE) MCG/ACT inhaler Inhale 2 puffs into the lungs every 4 (four) hours as needed. Shortness of breath 1 Inhaler 6  . albuterol (PROVENTIL) (2.5 MG/3ML) 0.083% nebulizer solution 1 vial in nebulizer every 6 hours as needed Dx 496 120 mL 6  . ALPRAZolam (XANAX) 0.5 MG tablet Take 0.5 mg by mouth at bedtime. For sleep and anxiety    . atorvastatin (LIPITOR) 40 MG tablet Take 10 mg by mouth daily.    . formoterol (FORADIL AEROLIZER) 12 MCG capsule for inhaler Inhale contents of 1 capsule two times a day     . gabapentin (NEURONTIN) 800 MG tablet Take 800 mg by mouth 2 (two) times daily.      . insulin aspart (NOVOLOG) 100 UNIT/ML injection 8-10 Units See admin instructions. Sliding scale as needed with meals (8-10 units at supper).    . insulin glargine (LANTUS) 100 UNIT/ML injection Inject 0.75 mLs (75 Units total) into the skin at bedtime. Can increase slowly by 5 units daily upto 70units depending on blood sugars during this week 10 mL   . levofloxacin (LEVAQUIN) 500 MG tablet Take 1 tablet (500 mg total) by mouth daily. 10 tablet 0  . levothyroxine (SYNTHROID, LEVOTHROID) 25 MCG tablet Take 25 mcg by mouth daily.    . Levothyroxine Sodium (SYNTHROID PO) Take 0.2 mg by mouth daily. Pt states that  he takes this medication that the [place] has given him.    [name] LORazepam (ATIVAN) 2 MG tablet Take 2 mg by mouth daily.  0  . losartan (COZAAR) 50 MG tablet Take 1 tablet (50 mg total) by mouth daily. 30 tablet 0  . metFORMIN (GLUCOPHAGE) 1000 MG tablet Take 1,000 mg by mouth 2 (two) times daily.      . mometasone (ASMANEX 60 METERED DOSES) 220 MCG/INH inhaler Inhale 2 puffs into the lungs daily.      . Multiple Vitamins-Minerals (MEGA MULTIVITAMIN FOR MEN PO) Take by mouth daily.      . naphazoline (CLEAR EYES) 0.012 % ophthalmic solution Place 1 drop into both eyes as needed. Red eyes     . PLAQUENIL 200 MG tablet Take 1 tablet by mouth Daily.    [name] senna-docusate (SENOKOT-S) 8.6-50 MG per tablet Take 1 tablet by mouth daily. 20 tablet 0  . sertraline (ZOLOFT) 100 MG tablet Take 1/2 by mouth daily as needed    . Tamsulosin HCl (FLOMAX) 0.4 MG CAPS Take by mouth at bedtime.      [name] tiotropium (SPIRIVA HANDIHALER) 18 MCG inhalation capsule Place 18 mcg into inhaler and inhale daily.       No current facility-administered medications for this visit.    Past Medical History  Diagnosis Date  . THRUSH 11/06/2009  . HYPOTHYROIDISM 07/30/2009  . DIABETES MELLITUS, TYPE II 11/23/2009  . DEPRESSION 11/23/2009  . 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 HYPERTENSION 07/30/2009  . CORONARY ARTERY DISEASE 11/23/2009  . CHRONIC OBSTRUCTIVE PULMONARY DISEASE, ACUTE EXACERBATION 03/01/2010  . C O P D 07/30/2009  . 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 RESPIRATORY FAILURE, CHRONIC 07/31/2009  . BENIGN PROSTATIC HYPERTROPHY 11/23/2009  . DEGENERATIVE JOINT DISEASE 11/23/2009  . 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 FATIGUE 11/23/2009  . 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 GAIT DISTURBANCE 12/10/2009  . DYSPNEA/SHORTNESS OF BREATH 12/08/2009  . HEMOPTYSIS UNSPECIFIED 05/07/2010  . RA (rheumatoid arthritis) 06/11/2011  . PTSD (post-traumatic stress disorder) 03/10/2012  . Scleritis of both eyes 03/17/2014    Past Surgical History   Procedure Laterality Date  . Small bowel obstruction repair with adhesiolysis    . Hx of remote ileum resection due to bleeding    . Abdominal aortic aneurysm repair    . S/p back surgury   1972    lumbar laminectomy Dr. Fannie Knee  . S/p left femoral embolectomy with left leg ischemia      Dr. Hart Rochester, vascular  . S/p right ganglion cyst      Dr. Teressa Senter    No family history on file.  History   Social History  . Marital Status: Married    Spouse Name: N/A    Number of Children: N/A  . Years of Education: N/A   Occupational History  . disabled veteran, Ex Human resources officer    Social History Main Topics  . Smoking status: Former Smoker -- 2.50 packs/day for 40 years    Types: Cigarettes, Pipe, Cigars    Quit date: 07/28/1998  . Smokeless tobacco: Never Used     Comment: quit in 2000 x 40 yrs. 2.5 ppd  . Alcohol Use: No  . Drug Use: No  . Sexual Activity: Not on file   Other Topics Concern  . Not on file   Social History Narrative   Lives in the home with his wife   Sees [place] every 6 month for meds.   Denies asbestos exposure    Review of Systems:  All systems reviewed.  They are negative to the above problem except as previously stated.  Vital Signs: BP 120/60 mmHg  Pulse 66  Ht 6\' 3"  (1.905 m)  Wt 256 lb 1.9 oz (116.175 kg)  BMI 32.01 kg/m2  Physical Exam Pat[place] is in NAD HEENT:  Normocephalic, atraumatic. EOMI, PERRLA.  Neck: JVP is normal.  No bruits.  Lungs:Decreased airflow.   Heart: Regular rate and rhythm. Normal S1, S2. No S3.   No significant murmurs. PMI not displaced.  Abdomen:  Supple, nontender. Normal bowel sounds. No masses. No hepatomegaly.  Extremities:   Good distal pulses throughout. No lower extremity edema.  Musculoskeletal :moving all extremities.  Neuro:   alert and oriented x3.  CN II-XII grossly intact.  Assessment and Plan:  1.  CAD Patient denies signif symptoms  Would continue current regimen   2.  COPD  Severe  3.  HL LDL is  excellent  Keep on same regimen    4.  HTN  Adequate control

## 2014-07-06 ENCOUNTER — Encounter: Payer: Self-pay | Admitting: Internal Medicine

## 2014-07-06 ENCOUNTER — Other Ambulatory Visit: Payer: Medicare Other

## 2014-07-06 ENCOUNTER — Ambulatory Visit (INDEPENDENT_AMBULATORY_CARE_PROVIDER_SITE_OTHER): Payer: Medicare Other | Admitting: Internal Medicine

## 2014-07-06 VITALS — BP 130/86 | HR 86 | Temp 98.2°F | Ht 75.0 in | Wt 257.0 lb

## 2014-07-06 DIAGNOSIS — R300 Dysuria: Secondary | ICD-10-CM

## 2014-07-06 DIAGNOSIS — I10 Essential (primary) hypertension: Secondary | ICD-10-CM

## 2014-07-06 DIAGNOSIS — E119 Type 2 diabetes mellitus without complications: Secondary | ICD-10-CM

## 2014-07-06 LAB — POCT URINALYSIS DIPSTICK
Bilirubin, UA: NEGATIVE
Blood, UA: NEGATIVE
GLUCOSE UA: NEGATIVE
Ketones, UA: NEGATIVE
Nitrite, UA: NEGATIVE
Protein, UA: 0.15
SPEC GRAV UA: 1.025
UROBILINOGEN UA: 4
pH, UA: 5.5

## 2014-07-06 MED ORDER — CIPROFLOXACIN HCL 500 MG PO TABS: 500.0000 mg | ORAL_TABLET | Freq: Two times a day (BID) | ORAL | Status: DC

## 2014-07-06 NOTE — Patient Instructions (Addendum)
Your specimen will be sent for culture, with the results due in about 2-4 days.  Please take all new medication as prescribed - the antibiotic  Please continue all other medications as before, and refills have been done if requested.  Please have the pharmacy call with any other refills you may need.  Please keep your appointments with your specialists as you may have planned  Please return in 2-3 months, or sooner if needed

## 2014-07-06 NOTE — Progress Notes (Signed)
Subjective:    Patient ID: [name], male    DOB: [birth year], 73 y.o.   MRN: [id number]  HPI  C/o dysuria x 2-3 days with urgency and small volumes, Denies urinary symptoms such as  frequency, urgency, flank pain, hematuria or n/v, fever, but had some chills last night.  Last had similar symptoms oct 2015, urine cx neg but improved with antibx.  Has seen urology in the past who offered what sounds like TURP, but too much risk of incontinence, so did not have done.  Pt denies chest pain, increased sob or doe, wheezing, orthopnea, PND, increased LE swelling, palpitations, dizziness or syncope.  Pt denies polydipsia, polyuria, Past Medical History  Diagnosis Date  . THRUSH 11/06/2009  . HYPOTHYROIDISM 07/30/2009  . DIABETES MELLITUS, TYPE II 11/23/2009  . DEPRESSION 11/23/2009  . 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 HYPERTENSION 07/30/2009  . CORONARY ARTERY DISEASE 11/23/2009  . CHRONIC OBSTRUCTIVE PULMONARY DISEASE, ACUTE EXACERBATION 03/01/2010  . C O P D 07/30/2009  . 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 RESPIRATORY FAILURE, CHRONIC 07/31/2009  . BENIGN PROSTATIC HYPERTROPHY 11/23/2009  . DEGENERATIVE JOINT DISEASE 11/23/2009  . 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 FATIGUE 11/23/2009  . 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 GAIT DISTURBANCE 12/10/2009  . DYSPNEA/SHORTNESS OF BREATH 12/08/2009  . HEMOPTYSIS UNSPECIFIED 05/07/2010  . RA (rheumatoid arthritis) 06/11/2011  . PTSD (post-traumatic stress disorder) 03/10/2012  . Scleritis of both eyes 03/17/2014   Past Surgical History  Procedure Laterality Date  . Small bowel obstruction repair with adhesiolysis    . Hx of remote ileum resection due to bleeding    . Abdominal aortic aneurysm repair    . S/p back surgury   1972    lumbar laminectomy Dr. Fannie Knee  . S/p left femoral embolectomy with left leg ischemia      Dr. Hart Rochester, vascular  . S/p right ganglion cyst      Dr. Teressa Senter    reports that he quit smoking about 15 years ago. His smoking use  included Cigarettes, Pipe, and Cigars. He has a 100 pack-year smoking history. He has never used smokeless tobacco. He reports that he does not drink alcohol or use illicit drugs. family history is not on file. No Known Allergies Current Outpatient Prescriptions on File Prior to Visit  Medication Sig Dispense Refill  . albuterol (PROAIR HFA) 108 (90 BASE) MCG/ACT inhaler Inhale 2 puffs into the lungs every 4 (four) hours as needed. Shortness of breath 1 Inhaler 6  . albuterol (PROVENTIL) (2.5 MG/3ML) 0.083% nebulizer solution 1 vial in nebulizer every 6 hours as needed Dx 496 120 mL 6  . ALPRAZolam (XANAX) 0.5 MG tablet Take 0.5 mg by mouth at bedtime. For sleep and anxiety    . atorvastatin (LIPITOR) 40 MG tablet Take 10 mg by mouth daily.    . formoterol (FORADIL AEROLIZER) 12 MCG capsule for inhaler Inhale contents of 1 capsule two times a day     . gabapentin (NEURONTIN) 800 MG tablet Take 800 mg by mouth 2 (two) times daily.      . insulin aspart (NOVOLOG) 100 UNIT/ML injection 8-10 Units See admin instructions. Sliding scale as needed with meals (8-10 units at supper).    . insulin glargine (LANTUS) 100 UNIT/ML injection Inject 0.75 mLs (75 Units total) into the skin at bedtime. Can increase slowly by 5 units daily upto 70units depending on blood sugars during this week 10 mL   . levofloxacin (LEVAQUIN) 500 MG tablet Take  1 tablet (500 mg total) by mouth daily. 10 tablet 0  . levothyroxine (SYNTHROID, LEVOTHROID) 25 MCG tablet Take 25 mcg by mouth daily.    . Levothyroxine Sodium (SYNTHROID PO) Take 0.2 mg by mouth daily. Pt states that he takes this medication that the [place] has given him.    [name] LORazepam (ATIVAN) 2 MG tablet Take 2 mg by mouth daily.  0  . losartan (COZAAR) 50 MG tablet Take 1 tablet (50 mg total) by mouth daily. 30 tablet 0  . metFORMIN (GLUCOPHAGE) 1000 MG tablet Take 1,000 mg by mouth 2 (two) times daily.      . mometasone (ASMANEX 60 METERED DOSES) 220 MCG/INH inhaler  Inhale 2 puffs into the lungs daily.      . Multiple Vitamins-Minerals (MEGA MULTIVITAMIN FOR MEN PO) Take by mouth daily.      . naphazoline (CLEAR EYES) 0.012 % ophthalmic solution Place 1 drop into both eyes as needed. Red eyes     . PLAQUENIL 200 MG tablet Take 1 tablet by mouth Daily.    [name] senna-docusate (SENOKOT-S) 8.6-50 MG per tablet Take 1 tablet by mouth daily. 20 tablet 0  . sertraline (ZOLOFT) 100 MG tablet Take 1/2 by mouth daily as needed    . Tamsulosin HCl (FLOMAX) 0.4 MG CAPS Take by mouth at bedtime.      [name] tiotropium (SPIRIVA HANDIHALER) 18 MCG inhalation capsule Place 18 mcg into inhaler and inhale daily.       No current facility-administered medications on file prior to visit.   Review of Systems  Constitutional: Negative for unusual diaphoresis or other sweats  HENT: Negative for ringing in ear Eyes: Negative for double vision or worsening visual disturbance.  Respiratory: Negative for choking and stridor.   Gastrointestinal: Negative for vomiting or other signifcant bowel change Genitourinary: Negative for hematuria or decreased urine volume.  Musculoskeletal: Negative for other MSK pain or swelling Skin: Negative for color change and worsening wound.  Neurological: Negative for tremors and numbness other than noted  Psychiatric/Behavioral: Negative for decreased concentration or agitation other than above       Objective:   Physical Exam BP 130/86 mmHg  Pulse 86  Temp(Src) 98.2 F (36.8 C) (Oral)  Ht 6\' 3"  (1.905 m)  Wt 257 lb (116.574 kg)  BMI 32.12 kg/m2  SpO2 95% VS noted, mild ill appearing Constitutional: Pt appears well-developed, well-nourished.  HENT: Head: NCAT.  Right Ear: External ear normal.  Left Ear: External ear normal.  Eyes: . Pupils are equal, round, and reactive to light. Conjunctivae and EOM are normal Neck: Normal range of motion. Neck supple.  Cardiovascular: Normal rate and regular rhythm.   Pulmonary/Chest: Effort normal and  breath sounds normal.  Abd:  Soft,  ND, + BS with mild low mid abd tender, no flank tender  Neurological: Pt is alert. Not confused , motor grossly intact Skin: Skin is warm. No rash Psychiatric: Pt behavior is normal. No agitation.     Assessment & Plan:

## 2014-07-08 LAB — URINE CULTURE
Colony Count: NO GROWTH
Organism ID, Bacteria: NO GROWTH

## 2014-07-09 ENCOUNTER — Encounter: Payer: Self-pay | Admitting: Internal Medicine

## 2014-07-09 DIAGNOSIS — R39198 Other difficulties with micturition: Secondary | ICD-10-CM

## 2014-07-09 DIAGNOSIS — R300 Dysuria: Secondary | ICD-10-CM

## 2014-07-09 NOTE — Assessment & Plan Note (Signed)
Like GU tract infection related, Mild to mod, for antibx course,  to f/u any worsening symptoms or concerns, for urine cx  - f/u ID and sens

## 2014-07-09 NOTE — Assessment & Plan Note (Signed)
stable overall by history and exam, recent data reviewed with pt, and pt to continue medical treatment as before,  to f/u any worsening symptoms or concerns Lab Results  Component Value Date   HGBA1C 8.3* 03/17/2014

## 2014-07-09 NOTE — Assessment & Plan Note (Signed)
stable overall by history and exam, recent data reviewed with pt, and pt to continue medical treatment as before,  to f/u any worsening symptoms or concerns BP Readings from Last 3 Encounters:  07/06/14 130/86  06/05/14 120/60  05/04/14 160/90

## 2014-08-04 ENCOUNTER — Encounter: Payer: Self-pay | Admitting: Internal Medicine

## 2014-08-04 ENCOUNTER — Ambulatory Visit (INDEPENDENT_AMBULATORY_CARE_PROVIDER_SITE_OTHER): Payer: Medicare Other | Admitting: Internal Medicine

## 2014-08-04 VITALS — BP 140/64 | HR 86 | Ht 75.0 in | Wt 256.0 lb

## 2014-08-04 DIAGNOSIS — J449 Chronic obstructive pulmonary disease, unspecified: Secondary | ICD-10-CM

## 2014-08-04 DIAGNOSIS — J9611 Chronic respiratory failure with hypoxia: Secondary | ICD-10-CM

## 2014-08-04 NOTE — Patient Instructions (Signed)
ICD-9-CM ICD-10-CM   1. Chronic respiratory failure with hypoxia 518.83 J96.11    799.02    2. COPD, severe 496 J44.9     COPD is stable Recommend advance care planning discussions with spouse and family You are uptodate with vaccines  COntinue meds and o2  Followup  4 months or sooner if needed

## 2014-08-04 NOTE — Progress Notes (Signed)
Subjective:    Patient ID: DIOR STEPTER, male    DOB: 1941-05-21, 74 y.o.   MRN: [id number]  HPI   OV June 2015 Followup Gold stage 3 copd with chronic resp failure (does not have ILD on 2015 CT chest). Known RA. Ex-smoker  Last seen by Dr. Levy Pupa  01/05/2014. At that time he was in the beginning of his COPD exacerbation. He was given Z-Pak. For this his cough and yellow sputum have improved partially but he still continues to be very dyspneic. He did not start the prednisone burst that was prescribed to him and was to be used as directed. There is no associated fever, hemoptysis, chest pain, pedal edema.  Pulmonary function test June 2015 shows Gold stage III COPD which is unchanged from baseline the DLCO is 28% and significantly reduced. Her on all the DLCO compared to baseline.  Past medical history: He has seen Dr. Tenny Craw in cardiology for coronary artery calcification. Stressed test is being deferred and he is curious as to why. Lipitor was increased but he did not tolerate this.   OV 08/04/2014  Chief Complaint  Patient presents with  . Follow-up    Pt c/o increase in SOB, lack of energy. Pt denies CP/tightness and cough.    Follow-up Gold stage III with chronic respiratory failure [does not have interstitial lung disease on 2015 CT chest]. Known rheumatoid arthritis and diabetes. Ex-smoker  Last seen June 2015. After that did call and in the same month June 2015 for COPD exacerbation. Since then he has been exacerbation free. However, he is not doing his exercise anymore and he reports a slow decline in quality of life and shortness of breath. His diabetes medications are being changed today was a little bit hypoglycemic and we corrected that with orange juice. He is more deconditioned than before. His wife also has advanced COPD. He says that his entire day spent taking care of him, her and the daily chores. He is frustrated by his poor quality of life. His wife is  started advanced care planning discussions. He is beginning to think of these. He did express that he would not want to be in an LTAC, trach to me him a chronic critical situation. During admissions he likes BiPAP. He made no comment about whether he would get intubated. He is up-to-date with his vaccines and there are no other issues   Immunization History  Administered Date(s) Administered  . Influenza Split 04/14/2011, 04/27/2013  . Influenza Whole 05/28/2009, 04/27/2010, 03/31/2012  . Influenza,inj,Quad PF,36+ Mos 03/17/2014  . Pneumococcal Conjugate-13 09/20/2013  . Pneumococcal Polysaccharide-23 05/28/2008  . Pneumococcal-Unspecified 04/27/2013  . Td 11/23/2009  . Zoster 04/28/2009     Review of Systems  Constitutional: Positive for fatigue. Negative for fever and unexpected weight change.  HENT: Negative for congestion, dental problem, ear pain, nosebleeds, postnasal drip, rhinorrhea, sinus pressure, sneezing, sore throat and trouble swallowing.   Eyes: Negative for redness and itching.  Respiratory: Positive for shortness of breath. Negative for cough, chest tightness and wheezing.   Cardiovascular: Negative for palpitations and leg swelling.  Gastrointestinal: Negative for nausea and vomiting.  Genitourinary: Negative for dysuria.  Musculoskeletal: Negative for joint swelling.  Skin: Negative for rash.  Neurological: Negative for headaches.  Hematological: Does not bruise/bleed easily.  Psychiatric/Behavioral: Negative for dysphoric mood. The patient is not nervous/anxious.     Current outpatient prescriptions: albuterol (PROAIR HFA) 108 (90 BASE) MCG/ACT inhaler, Inhale 2 puffs into the lungs every  4 (four) hours as needed. Shortness of breath, Disp: 1 Inhaler, Rfl: 6;  albuterol (PROVENTIL) (2.5 MG/3ML) 0.083% nebulizer solution, 1 vial in nebulizer every 6 hours as needed Dx 496, Disp: 120 mL, Rfl: 6;  ALPRAZolam (XANAX) 0.5 MG tablet, Take 0.5 mg by mouth at bedtime. For  sleep and anxiety, Disp: , Rfl:  atorvastatin (LIPITOR) 40 MG tablet, Take 10 mg by mouth daily., Disp: , Rfl: ;  formoterol (FORADIL AEROLIZER) 12 MCG capsule for inhaler, Inhale contents of 1 capsule two times a day , Disp: , Rfl: ;  gabapentin (NEURONTIN) 800 MG tablet, Take 800 mg by mouth 2 (two) times daily.  , Disp: , Rfl:  insulin aspart (NOVOLOG) 100 UNIT/ML injection, 8-10 Units See admin instructions. Sliding scale as needed with meals (8-10 units at supper)., Disp: , Rfl:  insulin glargine (LANTUS) 100 UNIT/ML injection, Inject 0.75 mLs (75 Units total) into the skin at bedtime. Can increase slowly by 5 units daily upto 70units depending on blood sugars during this week (Patient taking differently: Inject 65 Units into the skin at bedtime. Can increase slowly by 5 units daily upto 70units depending on blood sugars during this week), Disp: 10 mL, Rfl:  levothyroxine (SYNTHROID, LEVOTHROID) 25 MCG tablet, Take 25 mcg by mouth daily., Disp: , Rfl: ;  Levothyroxine Sodium (SYNTHROID PO), Take 0.2 mg by mouth daily. Pt states that he takes this medication that the [place] has given him., Disp: , Rfl: ;  LORazepam (ATIVAN) 2 MG tablet, Take 2 mg by mouth daily., Disp: , Rfl: 0;  losartan (COZAAR) 50 MG tablet, Take 1 tablet (50 mg total) by mouth daily., Disp: 30 tablet, Rfl: 0 metFORMIN (GLUCOPHAGE) 1000 MG tablet, Take 1,000 mg by mouth 2 (two) times daily.  , Disp: , Rfl: ;  mometasone (ASMANEX 60 METERED DOSES) 220 MCG/INH inhaler, Inhale 2 puffs into the lungs daily.  , Disp: , Rfl: ;  Multiple Vitamins-Minerals (MEGA MULTIVITAMIN FOR MEN PO), Take by mouth daily.  , Disp: , Rfl: ;  naphazoline (CLEAR EYES) 0.012 % ophthalmic solution, Place 1 drop into both eyes as needed. Red eyes , Disp: , Rfl:  PLAQUENIL 200 MG tablet, Take 1 tablet by mouth Daily., Disp: , Rfl: ;  saxagliptin HCl (ONGLYZA) 5 MG TABS tablet, Take 5 mg by mouth daily., Disp: , Rfl: ;  senna-docusate (SENOKOT-S) 8.6-50 MG per tablet,  Take 1 tablet by mouth daily., Disp: 20 tablet, Rfl: 0;  sertraline (ZOLOFT) 100 MG tablet, Take 1/2 by mouth daily as needed, Disp: , Rfl: ;  Tamsulosin HCl (FLOMAX) 0.4 MG CAPS, Take by mouth at bedtime.  , Disp: , Rfl:  tiotropium (SPIRIVA HANDIHALER) 18 MCG inhalation capsule, Place 18 mcg into inhaler and inhale daily.  , Disp: , Rfl: ;  Levothyroxine Sodium (SYNTHROID PO), Take 1.2 mg by mouth daily., Disp: , Rfl:       Objective:   Physical Exam  Filed Vitals:   08/04/14 1536  BP: 140/64  Pulse: 86  Height: 6\' 3"  (1.905 m)  Weight: 256 lb (116.121 kg)  SpO2: 98%      Nursing note and vitals reviewed. Constitutional: He is oriented to person, place, and time. He appears well-developed and well-nourished. No distress.  HENT:  Head: Normocephalic and atraumatic.  Right Ear: External ear normal.  Left Ear: External ear normal.  Mouth/Throat: Oropharynx is clear and moist. No oropharyngeal exudate.  Eyes: Conjunctivae and EOM are normal. Pupils are equal, round, and reactive to  light. Right eye exhibits no discharge. Left eye exhibits no discharge. No scleral icterus.  Neck: Normal range of motion. Neck supple. No JVD present. No tracheal deviation present. No thyromegaly present.  Cardiovascular: Normal rate, regular rhythm and intact distal pulses.  Exam reveals no gallop and no friction rub.   No murmur heard. Pulmonary/Chest: Effort normal and breath sounds normal. No respiratory distress. He has no wheezes. He has no rales. He exhibits no tenderness.  barrell chest . Chronic purse lip breathing + -seems worse than before Abdominal: Soft. Bowel sounds are normal. He exhibits no distension and no mass. There is no tenderness. There is no rebound and no guarding.  Musculoskeletal: Normal range of motion. He exhibits no edema and no tenderness.  Lymphadenopathy:    He has no cervical adenopathy.  Neurological: He is alert and oriented to person, place, and time. He has normal  reflexes. No cranial nerve deficit. Coordination normal.  Skin: Skin is warm and dry. No rash noted. He is not diaphoretic. No erythema. No pallor.  Psychiatric: He has a normal mood and affect. His behavior is normal. Judgment and thought content normal.      Assessment & Plan:     ICD-9-CM ICD-10-CM   1. Chronic respiratory failure with hypoxia 518.83 J96.11    799.02    2. COPD, severe 496 J44.9       COPD is stable Recommend advance care planning discussions with spouse and family You are uptodate with vaccines  COntinue meds and o2  Followup  4 months or sooner if needed   Dr. Kalman Shan, M.D., [facility].C.P Pulmonary and Critical Care Medicine Staff Physician [facility] Pager: [phone], If no answer or between  15:00h - 7:00h: call [phone]  08/04/2014 4:08 PM

## 2014-08-05 NOTE — Addendum Note (Signed)
Addended by: Corwin Levins on: 08/05/2014 06:50 PM   Modules accepted: Kipp Brood

## 2014-09-15 LAB — HM DIABETES EYE EXAM

## 2014-09-20 ENCOUNTER — Ambulatory Visit (INDEPENDENT_AMBULATORY_CARE_PROVIDER_SITE_OTHER): Payer: Medicare Other | Admitting: Internal Medicine

## 2014-09-20 ENCOUNTER — Other Ambulatory Visit (INDEPENDENT_AMBULATORY_CARE_PROVIDER_SITE_OTHER): Payer: Medicare Other

## 2014-09-20 ENCOUNTER — Encounter: Payer: Self-pay | Admitting: Internal Medicine

## 2014-09-20 VITALS — BP 132/68 | HR 74 | Temp 98.0°F | Resp 18 | Ht 75.0 in | Wt 256.1 lb

## 2014-09-20 DIAGNOSIS — E785 Hyperlipidemia, unspecified: Secondary | ICD-10-CM

## 2014-09-20 DIAGNOSIS — E119 Type 2 diabetes mellitus without complications: Secondary | ICD-10-CM

## 2014-09-20 DIAGNOSIS — Z0189 Encounter for other specified special examinations: Secondary | ICD-10-CM

## 2014-09-20 DIAGNOSIS — Z0000 Encounter for general adult medical examination without abnormal findings: Secondary | ICD-10-CM

## 2014-09-20 DIAGNOSIS — I10 Essential (primary) hypertension: Secondary | ICD-10-CM

## 2014-09-20 LAB — BASIC METABOLIC PANEL
BUN: 18 mg/dL (ref 6–23)
CO2: 32 mEq/L (ref 19–32)
Calcium: 9.3 mg/dL (ref 8.4–10.5)
Chloride: 103 mEq/L (ref 96–112)
Creatinine, Ser: 0.91 mg/dL (ref 0.40–1.50)
GFR: 86.79 mL/min (ref >60.00)
Glucose, Bld: 148 mg/dL — ABNORMAL HIGH (ref 70–99)
Potassium: 4.6 mEq/L (ref 3.5–5.1)
Sodium: 139 mEq/L (ref 135–145)

## 2014-09-20 LAB — HEMOGLOBIN A1C: Hgb A1c MFr Bld: 8 % — ABNORMAL HIGH (ref 4.6–6.5)

## 2014-09-20 LAB — LIPID PANEL
Cholesterol: 146 mg/dL (ref 0–200)
HDL: 30 mg/dL — ABNORMAL LOW (ref >39.00)
NonHDL: 116
Total CHOL/HDL Ratio: 5
Triglycerides: 292 mg/dL — ABNORMAL HIGH (ref 0.0–149.0)
VLDL: 58.4 mg/dL — ABNORMAL HIGH (ref 0.0–40.0)

## 2014-09-20 LAB — HEPATIC FUNCTION PANEL
ALT: 21 U/L (ref 0–53)
AST: 20 U/L (ref 0–37)
Albumin: 4.2 g/dL (ref 3.5–5.2)
Alkaline Phosphatase: 68 U/L (ref 39–117)
Bilirubin, Direct: 0.1 mg/dL (ref 0.0–0.3)
Total Bilirubin: 0.5 mg/dL (ref 0.2–1.2)
Total Protein: 6.7 g/dL (ref 6.0–8.3)

## 2014-09-20 LAB — LDL CHOLESTEROL, DIRECT: Direct LDL: 69 mg/dL

## 2014-09-20 NOTE — Assessment & Plan Note (Signed)
remians likely mild uncontrolled, not checking cbg's though recently as just got tired fo qid sticks, o/w stable overall by history and exam, recent data reviewed with pt, and pt to continue medical treatment as before,  to f/u any worsening symptoms or concerns  Lab Results  Component Value Date   HGBA1C 8.3* 03/17/2014   For lab f/u today

## 2014-09-20 NOTE — Progress Notes (Signed)
Subjective:    Patient ID: [name], male    DOB: May 26, 1941, 74 y.o.   MRN: [id number]  HPI  Here to f/u; overall doing ok,  Pt denies chest pain, increased sob or doe, wheezing, orthopnea, PND, increased LE swelling, palpitations, dizziness or syncope.  Pt denies polydipsia, polyuria, or low sugar symptoms such as weakness or confusion improved with po intake.  Pt denies new neurological symptoms such as new headache, or facial or extremity weakness or numbness.   Pt states overall good compliance with meds, has been trying to follow lower cholesterol, diabetic diet, with wt overall stable,  but little exercise however. [facility], he states cbg's jjust not improved, admits to some dietary noncompliance, and lack of exercise due to chronic lung dz. Next endo appt mar 8 at [facility] Readings from Last 3 Encounters:  09/20/14 256 lb 1.3 oz (116.157 kg)  08/04/14 256 lb (116.121 kg)  07/06/14 257 lb (116.574 kg)   Past Medical History  Diagnosis Date  . THRUSH 11/06/2009  . HYPOTHYROIDISM 07/30/2009  . DIABETES MELLITUS, TYPE II 11/23/2009  . DEPRESSION 11/23/2009  . 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 HYPERTENSION 07/30/2009  . CORONARY ARTERY DISEASE 11/23/2009  . CHRONIC OBSTRUCTIVE PULMONARY DISEASE, ACUTE EXACERBATION 03/01/2010  . C O P D 07/30/2009  . 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 RESPIRATORY FAILURE, CHRONIC 07/31/2009  . BENIGN PROSTATIC HYPERTROPHY 11/23/2009  . DEGENERATIVE JOINT DISEASE 11/23/2009  . 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 FATIGUE 11/23/2009  . 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 GAIT DISTURBANCE 12/10/2009  . DYSPNEA/SHORTNESS OF BREATH 12/08/2009  . HEMOPTYSIS UNSPECIFIED 05/07/2010  . RA (rheumatoid arthritis) 06/11/2011  . PTSD (post-traumatic stress disorder) 03/10/2012  . Scleritis of both eyes 03/17/2014   Past Surgical History  Procedure Laterality Date  . Small bowel obstruction repair with adhesiolysis    . Hx of remote ileum resection due  to bleeding    . Abdominal aortic aneurysm repair    . S/p back surgury   1972    lumbar laminectomy Dr. Fannie Knee  . S/p left femoral embolectomy with left leg ischemia      Dr. Hart [place], vascular  . S/p right ganglion cyst      Dr. Teressa Senter    reports that he quit smoking about 16 years ago. His smoking use included Cigarettes, Pipe, and Cigars. He has a 100 pack-year smoking history. He has never used smokeless tobacco. He reports that he does not drink alcohol or use illicit drugs. family history is not on file. No Known Allergies Current Outpatient Prescriptions on File Prior to Visit  Medication Sig Dispense Refill  . albuterol (PROAIR HFA) 108 (90 BASE) MCG/ACT inhaler Inhale 2 puffs into the lungs every 4 (four) hours as needed. Shortness of breath 1 Inhaler 6  . albuterol (PROVENTIL) (2.5 MG/3ML) 0.083% nebulizer solution 1 vial in nebulizer every 6 hours as needed Dx 496 120 mL 6  . ALPRAZolam (XANAX) 0.5 MG tablet Take 0.5 mg by mouth at bedtime. For sleep and anxiety    . atorvastatin (LIPITOR) 40 MG tablet Take 10 mg by mouth daily.    . formoterol (FORADIL AEROLIZER) 12 MCG capsule for inhaler Inhale contents of 1 capsule two times a day     . gabapentin (NEURONTIN) 800 MG tablet Take 800 mg by mouth 2 (two) times daily.      . insulin aspart (NOVOLOG) 100 UNIT/ML injection 8-10 Units See admin instructions. Sliding scale  as needed with meals (8-10 units at supper).    . insulin glargine (LANTUS) 100 UNIT/ML injection Inject 0.75 mLs (75 Units total) into the skin at bedtime. Can increase slowly by 5 units daily upto 70units depending on blood sugars during this week (Patient taking differently: Inject 65 Units into the skin at bedtime. Can increase slowly by 5 units daily upto 70units depending on blood sugars during this week) 10 mL   . levothyroxine (SYNTHROID, LEVOTHROID) 25 MCG tablet Take 25 mcg by mouth daily.    . Levothyroxine Sodium (SYNTHROID PO) Take 0.2 mg by mouth daily. Pt  states that he takes this medication that the [place] has given him.    . Levothyroxine Sodium (SYNTHROID PO) Take 1.2 mg by mouth daily.    [name] LORazepam (ATIVAN) 2 MG tablet Take 2 mg by mouth daily.  0  . losartan (COZAAR) 50 MG tablet Take 1 tablet (50 mg total) by mouth daily. 30 tablet 0  . metFORMIN (GLUCOPHAGE) 1000 MG tablet Take 1,000 mg by mouth 2 (two) times daily.      . mometasone (ASMANEX 60 METERED DOSES) 220 MCG/INH inhaler Inhale 2 puffs into the lungs daily.      . Multiple Vitamins-Minerals (MEGA MULTIVITAMIN FOR MEN PO) Take by mouth daily.      . naphazoline (CLEAR EYES) 0.012 % ophthalmic solution Place 1 drop into both eyes as needed. Red eyes     . PLAQUENIL 200 MG tablet Take 1 tablet by mouth Daily.    . saxagliptin HCl (ONGLYZA) 5 MG TABS tablet Take 5 mg by mouth daily.    [name] senna-docusate (SENOKOT-S) 8.6-50 MG per tablet Take 1 tablet by mouth daily. 20 tablet 0  . sertraline (ZOLOFT) 100 MG tablet Take 1/2 by mouth daily as needed    . Tamsulosin HCl (FLOMAX) 0.4 MG CAPS Take by mouth at bedtime.      [name] tiotropium (SPIRIVA HANDIHALER) 18 MCG inhalation capsule Place 18 mcg into inhaler and inhale daily.       No current facility-administered medications on file prior to visit.  \ Review of Systems  Constitutional: Negative for unusual diaphoresis or other sweats  HENT: Negative for ringing in ear Eyes: Negative for double vision or worsening visual disturbance.  Respiratory: Negative for choking and stridor.   Gastrointestinal: Negative for vomiting or other signifcant bowel change Genitourinary: Negative for hematuria or decreased urine volume.  Musculoskeletal: Negative for other MSK pain or swelling Skin: Negative for color change and worsening wound.  Neurological: Negative for tremors and numbness other than noted  Psychiatric/Behavioral: Negative for decreased concentration or agitation other than above       Objective:   Physical Exam BP 132/68 mmHg   Pulse 74  Temp(Src) 98 F (36.7 C) (Oral)  Resp 18  Ht 6\' 3"  (1.905 m)  Wt 256 lb 1.3 oz (116.157 kg)  BMI 32.01 kg/m2  SpO2 91% VS noted, on home o2 2L at rest, 3 L with exertion Constitutional: Pt appears well-developed, well-nourished.  HENT: Head: NCAT.  Right Ear: External ear normal.  Left Ear: External ear normal.  Eyes: . Pupils are equal, round, and reactive to light. Conjunctivae and EOM are normal Neck: Normal range of motion. Neck supple.  Cardiovascular: Normal rate and regular rhythm.   Pulmonary/Chest: Effort normal and breath sounds decreased bialt , no rales or wheezing Neurological: Pt is alert. Not confused , motor grossly intact Skin: Skin is warm. No rash Psychiatric: Pt behavior  is normal. No agitation.      Assessment & Plan:

## 2014-09-20 NOTE — Assessment & Plan Note (Signed)
stable overall by history and exam, recent data reviewed with pt, and pt to continue medical treatment as before,  to f/u any worsening symptoms or concerns Lab Results  Component Value Date   LDLCALC 97 02/18/2011

## 2014-09-20 NOTE — Patient Instructions (Signed)

## 2014-09-20 NOTE — Progress Notes (Signed)
Pre visit review using our clinic review tool, if applicable. No additional management support is needed unless otherwise documented below in the visit note. 

## 2014-09-20 NOTE — Assessment & Plan Note (Signed)
stable overall by history and exam, recent data reviewed with pt, and pt to continue medical treatment as before,  to f/u any worsening symptoms or concerns BP Readings from Last 3 Encounters:  09/20/14 132/68  08/04/14 140/64  07/06/14 130/86

## 2014-11-22 ENCOUNTER — Encounter: Payer: Self-pay | Admitting: Internal Medicine

## 2014-11-28 ENCOUNTER — Encounter: Payer: Self-pay | Admitting: Internal Medicine

## 2014-12-11 ENCOUNTER — Ambulatory Visit (INDEPENDENT_AMBULATORY_CARE_PROVIDER_SITE_OTHER): Payer: Medicare Other | Admitting: Internal Medicine

## 2014-12-11 ENCOUNTER — Encounter: Payer: Self-pay | Admitting: Internal Medicine

## 2014-12-11 VITALS — BP 110/60 | HR 92 | Ht 75.0 in | Wt 252.0 lb

## 2014-12-11 DIAGNOSIS — J9611 Chronic respiratory failure with hypoxia: Secondary | ICD-10-CM

## 2014-12-11 DIAGNOSIS — J449 Chronic obstructive pulmonary disease, unspecified: Secondary | ICD-10-CM | POA: Diagnosis not present

## 2014-12-11 DIAGNOSIS — R0982 Postnasal drip: Secondary | ICD-10-CM | POA: Diagnosis not present

## 2014-12-11 MED ORDER — FLUTICASONE PROPIONATE 50 MCG/ACT NA SUSP: 2.0000 | Freq: Every day | NASAL | Status: DC

## 2014-12-11 NOTE — Progress Notes (Signed)
Subjective:    Patient ID: [name], male    DOB: 06/09/1941, 74 y.o.   MRN: [id number]  HPI    OV 12/11/2014  Chief Complaint  Patient presents with  . Follow-up    Pt stated his breathing is overall unchanged. Pt denies cough and CP/tightness.      Follow-up Gold stage III with chronic respiratory failure [does not have interstitial lung disease on 2015 CT chest] in a patient with . Known rheumatoid arthritis and diabetes. Ex-smoker  - COPD: Last visit Jan 2016: Since then copd is stable. No new issues. No AECOPD. Wants innogen o2 system.    - New complaint: constant post nasal drainage - insidious onset, mild to moderate, all day, worse with exertion. Relieved by rest. No associated factosr. Quality: Clear. Wife has same and nasal steroid helps. He is not interested in workup  Past, Family, Social reviewed: no change since last visit. Wife and he are discussing EOL and advanced care planning. Wife more declined than him wiuht copd.     Current outpatient prescriptions:  .  albuterol (PROAIR HFA) 108 (90 BASE) MCG/ACT inhaler, Inhale 2 puffs into the lungs every 4 (four) hours as needed. Shortness of breath, Disp: 1 Inhaler, Rfl: 6 .  albuterol (PROVENTIL) (2.5 MG/3ML) 0.083% nebulizer solution, 1 vial in nebulizer every 6 hours as needed Dx 496, Disp: 120 mL, Rfl: 6 .  ALPRAZolam (XANAX) 0.5 MG tablet, Take 0.5 mg by mouth at bedtime. For sleep and anxiety, Disp: , Rfl:  .  atorvastatin (LIPITOR) 40 MG tablet, Take 10 mg by mouth daily., Disp: , Rfl:  .  formoterol (FORADIL AEROLIZER) 12 MCG capsule for inhaler, Inhale contents of 1 capsule two times a day , Disp: , Rfl:  .  gabapentin (NEURONTIN) 800 MG tablet, Take 800 mg by mouth 2 (two) times daily.  , Disp: , Rfl:  .  insulin aspart (NOVOLOG) 100 UNIT/ML injection, 8-10 Units See admin instructions. Sliding scale as needed with meals (8-10 units at supper)., Disp: , Rfl:  .  insulin glargine (LANTUS) 100 UNIT/ML  injection, Inject 0.75 mLs (75 Units total) into the skin at bedtime. Can increase slowly by 5 units daily upto 70units depending on blood sugars during this week (Patient taking differently: Inject 70 Units into the skin at bedtime. Can increase slowly by 5 units daily upto 70units depending on blood sugars during this week), Disp: 10 mL, Rfl:  .  Levothyroxine Sodium (SYNTHROID PO), Take 0.2 mg by mouth daily. Pt states that he [facility]., Disp: , Rfl:  .  losartan (COZAAR) 50 MG tablet, Take 1 tablet (50 mg total) by mouth daily., Disp: 30 tablet, Rfl: 0 .  metFORMIN (GLUCOPHAGE) 1000 MG tablet, Take 1,000 mg by mouth 2 (two) times daily.  , Disp: , Rfl:  .  mometasone (ASMANEX 60 METERED DOSES) 220 MCG/INH inhaler, Inhale 2 puffs into the lungs daily.  , Disp: , Rfl:  .  Multiple Vitamins-Minerals (MEGA MULTIVITAMIN FOR MEN PO), Take by mouth daily.  , Disp: , Rfl:  .  naphazoline (CLEAR EYES) 0.012 % ophthalmic solution, Place 1 drop into both eyes as needed. Red eyes , Disp: , Rfl:  .  PLAQUENIL 200 MG tablet, Take 1 tablet by mouth Daily., Disp: , Rfl:  .  saxagliptin HCl (ONGLYZA) 5 MG TABS tablet, Take 5 mg by mouth daily., Disp: , Rfl:  .  sertraline (ZOLOFT) 100 MG  tablet, 100 mg. Take 1/2 by mouth daily as needed, Disp: , Rfl:  .  Tamsulosin HCl (FLOMAX) 0.4 MG CAPS, Take by mouth at bedtime.  , Disp: , Rfl:  .  tiotropium (SPIRIVA HANDIHALER) 18 MCG inhalation capsule, Place 18 mcg into inhaler and inhale daily.  , Disp: , Rfl:  .  levothyroxine (SYNTHROID, LEVOTHROID) 25 MCG tablet, Take 25 mcg by mouth daily., Disp: , Rfl:      Review of Systems  Constitutional: Negative for fever and unexpected weight change.  HENT: Negative for congestion, dental problem, ear pain, nosebleeds, postnasal drip, rhinorrhea, sinus pressure, sneezing, sore throat and trouble swallowing.   Eyes: Negative for redness and itching.  Respiratory: Positive for  shortness of breath. Negative for cough, chest tightness and wheezing.   Cardiovascular: Negative for palpitations and leg swelling.  Gastrointestinal: Negative for nausea and vomiting.  Genitourinary: Negative for dysuria.  Musculoskeletal: Negative for joint swelling.  Skin: Negative for rash.  Neurological: Negative for headaches.  Hematological: Does not bruise/bleed easily.  Psychiatric/Behavioral: Negative for dysphoric mood. The patient is not nervous/anxious.        Objective:   Physical Exam  Constitutional: He is oriented to person, place, and time. He appears well-developed and well-nourished. No distress.  Body mass index is 31.5 kg/(m^2).   HENT:  Head: Normocephalic and atraumatic.  Right Ear: External ear normal.  Left Ear: External ear normal.  Mouth/Throat: Oropharynx is clear and moist. No oropharyngeal exudate.  o2 on + Mild post nasal drainage +  Eyes: Conjunctivae and EOM are normal. Pupils are equal, round, and reactive to light. Right eye exhibits no discharge. Left eye exhibits no discharge. No scleral icterus.  Neck: Normal range of motion. Neck supple. No JVD present. No tracheal deviation present. No thyromegaly present.  Cardiovascular: Normal rate, regular rhythm and intact distal pulses.  Exam reveals no gallop and no friction rub.   No murmur heard. Pulmonary/Chest: Effort normal and breath sounds normal. No respiratory distress. He has no wheezes. He has no rales. He exhibits no tenderness.  Purse lip breathing +  Abdominal: Soft. Bowel sounds are normal. He exhibits no distension and no mass. There is no tenderness. There is no rebound and no guarding.  Musculoskeletal: Normal range of motion. He exhibits no edema or tenderness.  Chronic LE varicosisties +  Lymphadenopathy:    He has no cervical adenopathy.  Neurological: He is alert and oriented to person, place, and time. He has normal reflexes. No cranial nerve deficit. Coordination normal.    Skin: Skin is warm and dry. No rash noted. He is not diaphoretic. No erythema. No pallor.  Psychiatric: He has a normal mood and affect. His behavior is normal. Judgment and thought content normal.  Nursing note and vitals reviewed.   Filed Vitals:   12/11/14 1438  BP: 110/60  Pulse: 92  Height: 6\' 3"  (1.905 m)  Weight: 252 lb (114.306 kg)  SpO2: 94%         Assessment & Plan:     ICD-9-CM ICD-10-CM   1. Chronic respiratory failure with hypoxia 518.83 J96.11 fluticasone (FLONASE) 50 MCG/ACT nasal spray   799.02    2. 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 Post-nasal drip 784.91 R09.82 fluticasone (FLONASE) 50 MCG/ACT nasal spray   #COPD COPD is stable COntinue meds and o2  - meet [facility] to get guidance on INNOGEN system ' #Post nasal drainage START generic fluticasone inhaler 2 squirts each  nostril daily - sent to [facility] [place]   Followup  5 months or sooner if needed   Dr. Kalman Shan, M.D., [facility].C.P Pulmonary and Critical Care Medicine Staff Physician [facility] Pager: [phone], If no answer or between  15:00h - 7:00h: call [phone]  12/11/2014 2:59 PM

## 2014-12-11 NOTE — Patient Instructions (Addendum)
ICD-9-CM ICD-10-CM   1. Chronic respiratory failure with hypoxia 518.83 J96.11    799.02    2. 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 Post-nasal drip 784.91 R09.82     COPD is stable COntinue meds and o2  - meet [facility] to get guidance on INNOGEN system START generic fluticasone inhaler 2 squirts each nostril daily - sent to [facility] [place]   Followup  5 months or sooner if needed

## 2014-12-13 ENCOUNTER — Telehealth: Payer: Self-pay | Admitting: Internal Medicine

## 2014-12-13 NOTE — Telephone Encounter (Signed)
Referral Notes     Type Date User   General 12/13/2014 9:25 AM LAW, DAWNE J        Note   Okey [facility] from [facility] called back stating that she spoke with her manager and they are able to accept this pt and try to get him set up for POC, hopefully the [employer] program they have. Okey Regal was transferred to [place] to leave a message for Triage because pt needs new Sats and some other things to get this process started. I spoke with the pt and he is aware that [facility] can accept him and that he should hear from our office in the next few days to get the new sats, he voiced understanding Lucilla Edin      I have contacted the pt and he will come in tomorrow to have his sats done. Spoke with Okey Regal at [facility] as well. She is going to fax over a form that they will need filled out, it will be addressed to [place]. Will route message to [place] to follow up on this form.

## 2014-12-14 ENCOUNTER — Ambulatory Visit (INDEPENDENT_AMBULATORY_CARE_PROVIDER_SITE_OTHER): Payer: Medicare Other

## 2014-12-14 DIAGNOSIS — J449 Chronic obstructive pulmonary disease, unspecified: Secondary | ICD-10-CM

## 2014-12-14 NOTE — Telephone Encounter (Signed)
Walk completed today (5/19) by Morrie Sheldon. Will look out for form.

## 2014-12-15 NOTE — Telephone Encounter (Signed)
Pt called & states he is going to stay with [facility]. He is going to personally pay for it & not go thru Medicare. Ph # [phone]

## 2014-12-15 NOTE — Telephone Encounter (Signed)
Spoke with the pt  He states that he has decided to purchase a POC on his own  Nothing needed from our end at this time

## 2015-01-22 ENCOUNTER — Other Ambulatory Visit: Payer: Self-pay

## 2015-03-21 ENCOUNTER — Encounter: Payer: Self-pay | Admitting: Internal Medicine

## 2015-03-21 ENCOUNTER — Ambulatory Visit (INDEPENDENT_AMBULATORY_CARE_PROVIDER_SITE_OTHER): Payer: Medicare Other | Admitting: Internal Medicine

## 2015-03-21 ENCOUNTER — Other Ambulatory Visit (INDEPENDENT_AMBULATORY_CARE_PROVIDER_SITE_OTHER): Payer: Medicare Other

## 2015-03-21 VITALS — BP 150/70 | HR 67 | Temp 98.0°F | Ht 75.0 in | Wt 252.2 lb

## 2015-03-21 DIAGNOSIS — Z23 Encounter for immunization: Secondary | ICD-10-CM

## 2015-03-21 DIAGNOSIS — E119 Type 2 diabetes mellitus without complications: Secondary | ICD-10-CM | POA: Diagnosis not present

## 2015-03-21 DIAGNOSIS — I10 Essential (primary) hypertension: Secondary | ICD-10-CM

## 2015-03-21 DIAGNOSIS — Z0000 Encounter for general adult medical examination without abnormal findings: Secondary | ICD-10-CM | POA: Diagnosis not present

## 2015-03-21 DIAGNOSIS — N400 Enlarged prostate without lower urinary tract symptoms: Secondary | ICD-10-CM | POA: Diagnosis not present

## 2015-03-21 LAB — BASIC METABOLIC PANEL
BUN: 10 mg/dL (ref 6–23)
CHLORIDE: 103 meq/L (ref 96–112)
CO2: 31 meq/L (ref 19–32)
Calcium: 9.3 mg/dL (ref 8.4–10.5)
Creatinine, Ser: 0.83 mg/dL (ref 0.40–1.50)
GFR: 96.37 mL/min (ref >60.00)
GLUCOSE: 144 mg/dL — AB (ref 70–99)
POTASSIUM: 4.5 meq/L (ref 3.5–5.1)
SODIUM: 142 meq/L (ref 135–145)

## 2015-03-21 LAB — HEPATIC FUNCTION PANEL
ALBUMIN: 4.4 g/dL (ref 3.5–5.2)
ALK PHOS: 66 U/L (ref 39–117)
ALT: 22 U/L (ref 0–53)
AST: 23 U/L (ref 0–37)
Bilirubin, Direct: 0.1 mg/dL (ref 0.0–0.3)
Total Bilirubin: 0.6 mg/dL (ref 0.2–1.2)
Total Protein: 6.8 g/dL (ref 6.0–8.3)

## 2015-03-21 LAB — URINALYSIS, ROUTINE W REFLEX MICROSCOPIC
Bilirubin Urine: NEGATIVE
Hgb urine dipstick: NEGATIVE
KETONES UR: NEGATIVE
Leukocytes, UA: NEGATIVE
NITRITE: NEGATIVE
RBC / HPF: NONE SEEN (ref 0–?)
SPECIFIC GRAVITY, URINE: 1.02 (ref 1.000–1.030)
TOTAL PROTEIN, URINE-UPE24: NEGATIVE
URINE GLUCOSE: NEGATIVE
UROBILINOGEN UA: 2 — AB (ref 0.0–1.0)
WBC UA: NONE SEEN (ref 0–?)
pH: 6.5 (ref 5.0–8.0)

## 2015-03-21 LAB — LIPID PANEL
CHOLESTEROL: 134 mg/dL (ref 0–200)
HDL: 34.1 mg/dL — ABNORMAL LOW (ref >39.00)
LDL Cholesterol: 80 mg/dL (ref 0–99)
NonHDL: 100.03
TRIGLYCERIDES: 100 mg/dL (ref 0.0–149.0)
Total CHOL/HDL Ratio: 4
VLDL: 20 mg/dL (ref 0.0–40.0)

## 2015-03-21 LAB — HEMOGLOBIN A1C: HEMOGLOBIN A1C: 7.7 % — AB (ref 4.6–6.5)

## 2015-03-21 LAB — TSH: TSH: 0.43 u[IU]/mL (ref 0.35–4.50)

## 2015-03-21 LAB — CBC WITH DIFFERENTIAL/PLATELET
Basophils Absolute: 0 10*3/uL (ref 0.0–0.1)
Basophils Relative: 0.5 % (ref 0.0–3.0)
EOS ABS: 0.2 10*3/uL (ref 0.0–0.7)
EOS PCT: 3.5 % (ref 0.0–5.0)
HCT: 36.3 % — ABNORMAL LOW (ref 39.0–52.0)
HEMOGLOBIN: 11.9 g/dL — AB (ref 13.0–17.0)
LYMPHS ABS: 1.6 10*3/uL (ref 0.7–4.0)
Lymphocytes Relative: 23 % (ref 12.0–46.0)
MCHC: 32.7 g/dL (ref 30.0–36.0)
MCV: 80 fl (ref 78.0–100.0)
MONO ABS: 0.6 10*3/uL (ref 0.1–1.0)
Monocytes Relative: 8.6 % (ref 3.0–12.0)
NEUTROS PCT: 64.4 % (ref 43.0–77.0)
Neutro Abs: 4.5 10*3/uL (ref 1.4–7.7)
Platelets: 141 10*3/uL — ABNORMAL LOW (ref 150.0–400.0)
RBC: 4.55 Mil/uL (ref 4.22–5.81)
RDW: 15.4 % (ref 11.5–15.5)
WBC: 6.9 10*3/uL (ref 4.0–10.5)

## 2015-03-21 LAB — MICROALBUMIN / CREATININE URINE RATIO
Creatinine,U: 149.4 mg/dL
Microalb Creat Ratio: 2.7 mg/g (ref 0.0–30.0)
Microalb, Ur: 4 mg/dL — ABNORMAL HIGH (ref 0.0–1.9)

## 2015-03-21 LAB — PSA: PSA: 0.22 ng/mL (ref 0.10–4.00)

## 2015-03-21 NOTE — Patient Instructions (Addendum)
You had the flu shot today  Please continue all other medications as before, and refills have been done if requested.  Please have the pharmacy call with any other refills you may need.  Please continue your efforts at being more active, low cholesterol diet, and weight control.  You are otherwise up to date with prevention measures today.  Please keep your appointments with your specialists as you may have planned  Please go to the LAB in the Basement (turn left off the elevator) for the tests to be done today  You will be contacted by phone if any changes need to be made immediately.  Otherwise, you will receive a letter about your results with an explanation, but please check with MyChart first.  Please remember to sign up for MyChart if you have not done so, as this will be important to you in the future with finding out test results, communicating by private email, and scheduling acute appointments online when needed.  Please return in 6 months, or sooner if needed    [name] , Thank you for taking time to come for your Medicare Wellness Visit. I appreciate your ongoing commitment to your health goals. Please review the following plan we discussed and let me know if I can assist you in the future.   Will take flu after cpe today; prefers high dose   These are the goals we discussed: Goals    . maintain     Continue to maintain health with current practices; Goes to the gym in the winter; Does all the house hold work at home; cooks, Lexicographer , Catering manager.        This is a list of the screening recommended for you and due dates:  Health Maintenance  Topic Date Due  . Complete foot exam   03/16/2014  . Flu Shot  02/26/2015  . Urine Protein Check  03/18/2015  . Hemoglobin A1C  03/21/2015  . Eye exam for diabetics  09/16/2015  . Colon Cancer Screening  07/28/2016  . Tetanus Vaccine  11/24/2019  . Shingles Vaccine  Completed  . Pneumonia vaccines  Completed     Fall  Prevention and Home Safety Falls cause injuries and can affect all age groups. It is possible to prevent falls.  HOW TO PREVENT FALLS  Wear shoes with rubber soles that do not have an opening for your toes.  Keep the inside and outside of your house well lit.  Use night lights throughout your home.  Remove clutter from floors.  Clean up floor spills.  Remove throw rugs or fasten them to the floor with carpet tape.  Do not place electrical cords across pathways.  Put grab bars by your tub, shower, and toilet. Do not use towel bars as grab bars.  Put handrails on both sides of the stairway. Fix loose handrails.  Do not climb on stools or stepladders, if possible.  Do not wax your floors.  Repair uneven or unsafe sidewalks, walkways, or stairs.  Keep items you use a lot within reach.  Be aware of pets.  Keep emergency numbers next to the telephone.  Put smoke detectors in your home and near bedrooms. Ask your doctor what other things you can do to prevent falls. Document Released: 05/10/2009 Document Revised: 01/13/2012 Document Reviewed: 10/14/2011 [facility] Patient Information 2015 [employer], [place]. This information is not intended to replace advice given to you by your health care provider. Make sure you discuss any questions you have with your  health care provider.  Health Maintenance A healthy lifestyle and preventative care can promote health and wellness.  Maintain regular health, dental, and eye exams.  Eat a healthy diet. Foods like vegetables, fruits, whole grains, low-fat dairy products, and lean protein foods contain the nutrients you need and are low in calories. Decrease your intake of foods high in solid fats, added sugars, and salt. Get information about a proper diet from your health care provider, if necessary.  Regular physical exercise is one of the most important things you can do for your health. Most adults should get at least 150 minutes of  moderate-intensity exercise (any activity that increases your heart rate and causes you to sweat) each week. In addition, most adults need muscle-strengthening exercises on 2 or more days a week.   Maintain a healthy weight. The body mass index (BMI) is a screening tool to identify possible weight problems. It provides an estimate of body fat based on height and weight. Your health care provider can find your BMI and can help you achieve or maintain a healthy weight. For males 20 years and older:  A BMI below 18.5 is considered underweight.  A BMI of 18.5 to 24.9 is normal.  A BMI of 25 to 29.9 is considered overweight.  A BMI of 30 and above is considered obese.  Maintain normal blood lipids and cholesterol by exercising and minimizing your intake of saturated fat. Eat a balanced diet with plenty of fruits and vegetables. Blood tests for lipids and cholesterol should begin at age 57 and be repeated every 5 years. If your lipid or cholesterol levels are high, you are over age 49, or you are at high risk for heart disease, you may need your cholesterol levels checked more frequently.Ongoing high lipid and cholesterol levels should be treated with medicines if diet and exercise are not working.  If you smoke, find out from your health care provider how to quit. If you do not use tobacco, do not start.  Lung cancer screening is recommended for adults aged 55-80 years who are at high risk for developing lung cancer because of a history of smoking. A yearly low-dose CT scan of the lungs is recommended for people who have at least a 30-pack-year history of smoking and are current smokers or have quit within the past 15 years. A pack year of smoking is smoking an average of 1 pack of cigarettes a day for 1 year (for example, a 30-pack-year history of smoking could mean smoking 1 pack a day for 30 years or 2 packs a day for 15 years). Yearly screening should continue until the smoker has stopped smoking  for at least 15 years. Yearly screening should be stopped for people who develop a health problem that would prevent them from having lung cancer treatment.  If you choose to drink alcohol, do not have more than 2 drinks per day. One drink is considered to be 12 oz (360 mL) of beer, 5 oz (150 mL) of wine, or 1.5 oz (45 mL) of liquor.  Avoid the use of street drugs. Do not share needles with anyone. Ask for help if you need support or instructions about stopping the use of drugs.  High blood pressure causes heart disease and increases the risk of stroke. Blood pressure should be checked at least every 1-2 years. Ongoing high blood pressure should be treated with medicines if weight loss and exercise are not effective.  If you are 34-63 years old, ask  your health care provider if you should take aspirin to prevent heart disease.  Diabetes screening involves taking a blood sample to check your fasting blood sugar level. This should be done once every 3 years after age 56 if you are at a normal weight and without risk factors for diabetes. Testing should be considered at a younger age or be carried out more frequently if you are overweight and have at least 1 risk factor for diabetes.  Colorectal cancer can be detected and often prevented. Most routine colorectal cancer screening begins at the age of 68 and continues through age 86. However, your health care provider may recommend screening at an earlier age if you have risk factors for colon cancer. On a yearly basis, your health care provider may provide home test kits to check for hidden blood in the stool. A small camera at the end of a tube may be used to directly examine the colon (sigmoidoscopy or colonoscopy) to detect the earliest forms of colorectal cancer. Talk to your health care provider about this at age 86 when routine screening begins. A direct exam of the colon should be repeated every 5-10 years through age 11, unless early forms of  precancerous polyps or small growths are found.  People who are at an increased risk for hepatitis B should be screened for this virus. You are considered at high risk for hepatitis B if:  You were born in a country where hepatitis B occurs often. Talk with your health care provider about which countries are considered high risk.  Your parents were born in a high-risk country and you have not received a shot to protect against hepatitis B (hepatitis B vaccine).  You have HIV or AIDS.  You use needles to inject street drugs.  You live with, or have sex with, someone who has hepatitis B.  You are a man who has sex with other men (MSM).  You get hemodialysis treatment.  You take certain medicines for conditions like cancer, organ transplantation, and autoimmune conditions.  Hepatitis C blood testing is recommended for all people born from [birth year] through 1964 and any individual with known risk factors for hepatitis C.  Healthy men should no longer receive prostate-specific antigen (PSA) blood tests as part of routine cancer screening. Talk to your health care provider about prostate cancer screening.  Testicular cancer screening is not recommended for adolescents or adult males who have no symptoms. Screening includes self-exam, a health care provider exam, and other screening tests. Consult with your health care provider about any symptoms you have or any concerns you have about testicular cancer.  Practice safe sex. Use condoms and avoid high-risk sexual practices to reduce the spread of sexually transmitted infections (STIs).  You should be screened for STIs, including gonorrhea and chlamydia if:  You are sexually active and are younger than 24 years.  You are older than 24 years, and your health care provider tells you that you are at risk for this type of infection.  Your sexual activity has changed since you were last screened, and you are at an increased risk for chlamydia or  gonorrhea. Ask your health care provider if you are at risk.  If you are at risk of being infected with HIV, it is recommended that you take a prescription medicine daily to prevent HIV infection. This is called pre-exposure prophylaxis (PrEP). You are considered at risk if:  You are a man who has sex with other men (MSM).  You  are a heterosexual man who is sexually active with multiple partners.  You take drugs by injection.  You are sexually active with a partner who has HIV.  Talk with your health care provider about whether you are at high risk of being infected with HIV. If you choose to begin PrEP, you should first be tested for HIV. You should then be tested every 3 months for as long as you are taking PrEP.  Use sunscreen. Apply sunscreen liberally and repeatedly throughout the day. You should seek shade when your shadow is shorter than you. Protect yourself by wearing long sleeves, pants, a wide-brimmed hat, and sunglasses year round whenever you are outdoors.  Tell your health care provider of new moles or changes in moles, especially if there is a change in shape or color. Also, tell your health care provider if a mole is larger than the size of a pencil eraser.  A one-time screening for abdominal aortic aneurysm (AAA) and surgical repair of large AAAs by ultrasound is recommended for men aged 65-75 years who are current or former smokers.  Stay current with your vaccines (immunizations). Document Released: 01/10/2008 Document Revised: 07/19/2013 Document Reviewed: 12/09/2010 [facility] Patient Information 2015 [place], [place]. This information is not intended to replace advice given to you by your health care provider. Make sure you discuss any questions you have with your health care provider.

## 2015-03-21 NOTE — Progress Notes (Signed)
Subjective:   [name] is a 74 y.o. male who presents for Medicare Annual/Subsequent preventive examination.  Review of Systems:   Cardiac Risk Factors include: diabetes mellitus;advanced age (>54men, >70 women);dyslipidemia;hypertension;male gender;obesity (BMI >30kg/m2) HRA assessment completed during visit;  Patient is here for Annual Wellness Assessment prior to apt with Dr. Jonny Ruiz  Problem list reviewed and are being managed medically;  Will educate for lifestyle changes as appropriate    Problem list review for risk CAD; HTN; DM Lipids 08/2014; HDL 30; LDL 69; chol 146; Trig 292*; A1c is 8  April [facility] results and August 2nd A1c 7.8;  Falls risk orthostatic hypotension COPD exacerbation; been over a year Dr. Ethelene Hal for pulmonary issues; Gets Z pak and prednisone if he starts to get sick  BMI: 31.5  Diet; Eats breakfast; cereal; eggs; oatmeal; Lunch; eat a sandwich; [place] or lean ham Supper Meat and couple of vegetables  Exercise; tolerance for exercise is not worse but not better Has not been in the gym since it got hot/ goes to the gym when it cools down Had LFT; earlier this year;  Functionally still drive; still does grocery shopping; housekeeping; laundry    Family Hx  Social history: Lives in townhome but no stairs; Handicapped accessible; Safe neighborhood; good neighbors 1 dtr; close by/ one grand dtr;  Safety; driving good;  Falls; no / gets up slow;  Depression under treatment; patient states he is feeling better;  Goals; patient centered ; continue to stay healthy Diabetes; Trending down 7.8; / Goal is 7.5; new med may bring it down   Psychosocial support; safe community; firearm safety; smoke alarms;  Caregiver to other? Takes care of wife who also has COPD but is not as able has patient  Discussed Goal to improve health based on risk  Screenings overdue: foot exam; deferred to CPE Ophthalmology exam; June 2016; and eye Exam Dr. Barbara Cower Gould/  no glaucoma and no diabetic retinopathy Immunizations; Pneumonia series completed; will take flu shot today Shingles 04/2009 Colonoscopy; 07/2006 EKG 11/2013  Hearing: 2000 hz right ear; 1000 hz on left / ears at [facility] / has hearing aid at home Dental:   Gave information on safety to take home;   Current Care Team reviewed and updated PCP at [facility] Dr. Gwynneth Macleod; [facility]; dr. Everlena Cooper arthritis settled in eyes; causes dark circles in eyes from inflammation; See [facility] at [facility] q 6 months/ Plaquenil      Objective:    Vitals: BP 150/70 mmHg  Pulse 67  Temp(Src) 98 F (36.7 C) (Oral)  Ht 6\' 3"  (1.905 m)  Wt 252 lb 4 oz (114.42 kg)  BMI 31.53 kg/m2  SpO2 93%  Tobacco History  Smoking status  . Former Smoker -- 2.50 packs/day for 40 years  . Types: Cigarettes, Pipe, Cigars  . Quit date: 07/28/1998  Smokeless tobacco  . Never Used    Comment: quit in 2000 x 40 yrs. 2.5 ppd     Counseling given: Yes   Past Medical History  Diagnosis Date  . THRUSH 11/06/2009  . HYPOTHYROIDISM 07/30/2009  . DIABETES MELLITUS, TYPE II 11/23/2009  . DEPRESSION 11/23/2009  . 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 HYPERTENSION 07/30/2009  . CORONARY ARTERY DISEASE 11/23/2009  . CHRONIC OBSTRUCTIVE PULMONARY DISEASE, ACUTE EXACERBATION 03/01/2010  . C O P D 07/30/2009  . 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 RESPIRATORY FAILURE, CHRONIC 07/31/2009  . BENIGN PROSTATIC HYPERTROPHY 11/23/2009  . DEGENERATIVE JOINT DISEASE 11/23/2009  . 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 FATIGUE 11/23/2009  . 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 GAIT DISTURBANCE 12/10/2009  . DYSPNEA/SHORTNESS OF BREATH 12/08/2009  . HEMOPTYSIS UNSPECIFIED 05/07/2010  . RA (rheumatoid arthritis) 06/11/2011  . PTSD (post-traumatic stress disorder) 03/10/2012  . Scleritis of both eyes 03/17/2014   Past Surgical History  Procedure Laterality Date  . Small bowel obstruction repair with adhesiolysis    . Hx of remote ileum resection due to bleeding    . Abdominal aortic  aneurysm repair    . S/p back surgury   1972    lumbar laminectomy Dr. Fannie Knee  . S/p left femoral embolectomy with left leg ischemia      Dr. Hart [place], vascular  . S/p right ganglion cyst      Dr. Teressa Senter   No family history on file. History  Sexual Activity  . Sexual Activity: Not on file    Outpatient Encounter Prescriptions as of 03/21/2015  Medication Sig  . albuterol (PROAIR HFA) 108 (90 BASE) MCG/ACT inhaler Inhale 2 puffs into the lungs every 4 (four) hours as needed. Shortness of breath  . albuterol (PROVENTIL) (2.5 MG/3ML) 0.083% nebulizer solution 1 vial in nebulizer every 6 hours as needed Dx 496  . ALPRAZolam (XANAX) 0.5 MG tablet Take 0.5 mg by mouth at bedtime. For sleep and anxiety  . atorvastatin (LIPITOR) 40 MG tablet Take 10 mg by mouth daily.  . cycloSPORINE (RESTASIS) 0.05 % ophthalmic emulsion Place 1 drop into both eyes 2 (two) times daily.  . fluticasone (FLONASE) 50 MCG/ACT nasal spray Place 2 sprays into both nostrils daily.  [name] gabapentin (NEURONTIN) 800 MG tablet Take 800 mg by mouth 2 (two) times daily.    . insulin aspart (NOVOLOG) 100 UNIT/ML injection 8-10 Units See admin instructions. Sliding scale as needed with meals (8-10 units at supper).  . insulin glargine (LANTUS) 100 UNIT/ML injection Inject 0.75 mLs (75 Units total) into the skin at bedtime. Can increase slowly by 5 units daily upto 70units depending on blood sugars during this week (Patient taking differently: Inject 70 Units into the skin at bedtime. Can increase slowly by 5 units daily upto 70units depending on blood sugars during this week)  . Levothyroxine Sodium (SYNTHROID PO) Take 0.2 mg by mouth daily. Pt states that he takes this medication that the [place] has given him.  [name] losartan (COZAAR) 50 MG tablet Take 1 tablet (50 mg total) by mouth daily.  . metFORMIN (GLUCOPHAGE) 1000 MG tablet Take 1,000 mg by mouth 2 (two) times daily.    . mometasone (ASMANEX 60 METERED DOSES) 220 MCG/INH inhaler Inhale  2 puffs into the lungs daily.    . Multiple Vitamins-Minerals (MEGA MULTIVITAMIN FOR MEN PO) Take by mouth daily.    . Olodaterol HCl (STRIVERDI RESPIMAT) 2.5 MCG/ACT AERS Inhale 2.5 Inhalers into the lungs 1 day or 1 dose. 2 inhalations once a day  . PLAQUENIL 200 MG tablet Take 1 tablet by mouth Daily.  . saxagliptin HCl (ONGLYZA) 5 MG TABS tablet Take 5 mg by mouth daily.  . sertraline (ZOLOFT) 100 MG tablet 100 mg. Take 1/2 by mouth daily as needed  . Tamsulosin HCl (FLOMAX) 0.4 MG CAPS Take by mouth at bedtime.    [name] tiotropium (SPIRIVA HANDIHALER) 18 MCG inhalation capsule Place 18 mcg into inhaler and inhale daily.    . naphazoline (CLEAR EYES) 0.012 % ophthalmic solution Place 1 drop into both eyes as needed. Red eyes   . [DISCONTINUED] formoterol (FORADIL AEROLIZER) 12 MCG capsule for  inhaler Inhale contents of 1 capsule two times a day   . [DISCONTINUED] levothyroxine (SYNTHROID, LEVOTHROID) 25 MCG tablet Take 25 mcg by mouth daily.   No facility-administered encounter medications on file as of 03/21/2015.    Activities of Daily Living In your present state of health, do you have any difficulty performing the following activities: 03/21/2015  Hearing? (No Data)  Vision? N  Difficulty concentrating or making decisions? N  Walking or climbing stairs? Y  Dressing or bathing? N  Doing errands, shopping? N  Preparing Food and eating ? N  Using the Toilet? N  Managing your Medications? N  Managing your Finances? N  Housekeeping or managing your Housekeeping? N    Patient Care Team: Corwin Levins, MD as PCP - General Ernesto Rutherford, MD as Consulting Physician (Ophthalmology) Kalman Shan, MD as Consulting Physician (Pulmonary Disease) Provider Not In System as Consulting Physician   Assessment:    Objective:   The goal of the wellness visit is to assist the patient how to close the gaps in care and create a preventative care plan for the patient.   Pt determined a  personalized goal; see patient goals to maintain his health.   Assessment included: smoking (secondary) educated as appropriate; including LDCT if as ppropriate but quit in 2000; now 16 years;  Meds reviewed and the patient is compliant. Med changed noted as just reviewed by the [place]; He is getting his meds from the [place].   Labs were and fup visit noted with MD if labs are due to be re-drawn.  Stress: Recommendations for managing stress if assessed as a factor; States he is reasonably happy. States sertraline is making him "less angry" and appears to be helping him.   No Risk for hepatitis or high risk social behavior identified via hepatitis screen  Safety issues reviewed; Educated on vaccines and high does flu; deferred to MD  Cognition assessed by AD8; Score 0 MMSE deferred as the patient stated they had no memory issues; No identified risk were noted; The patient was oriented x 3; appropriate in dress and manner and no objective failures at ADL's or IADL's.   Depression screen negative;   Functional status reviewed; no losses in function x 1 year  Bowel and bladder issues assessed  End of life planning was discussed; aging in home or other; plans to complete HCPOA were discussed if not completed    Exercise Activities and Dietary recommendations Current Exercise Habits:: Home exercise routine, Type of exercise: walking, Time (Minutes): 30, Frequency (Times/Week): 2 (not doing much due to summer heat; but will start back ), Weekly Exercise (Minutes/Week): 60, Intensity: Mild  Goals    . 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       Fall Risk Fall Risk  03/21/2015 03/17/2014 03/16/2013  Falls in the past year? No No No   Depression Screen PHQ 2/9 Scores 03/21/2015 03/17/2014 03/16/2013  PHQ - 2 Score 0 0 1    Cognitive Testing No flowsheet data  found.  Immunization History  Administered Date(s) Administered  . Influenza Split 04/14/2011, 04/27/2013  . Influenza Whole 05/28/2009, 04/27/2010, 03/31/2012  . Influenza, High Dose Seasonal PF 03/21/2015  . Influenza,inj,Quad PF,36+ Mos 03/17/2014  . Pneumococcal Conjugate-13 09/20/2013  . Pneumococcal Polysaccharide-23 05/28/2008  . Pneumococcal-Unspecified 04/27/2013  . Td 11/23/2009  . Zoster 04/28/2009  Screening Tests Health Maintenance  Topic Date Due  . FOOT EXAM  03/16/2014  . INFLUENZA VACCINE  02/26/2015  . URINE MICROALBUMIN  03/18/2015  . HEMOGLOBIN A1C  03/21/2015  . OPHTHALMOLOGY EXAM  09/16/2015  . COLONOSCOPY  07/28/2016  . TETANUS/TDAP  11/24/2019  . ZOSTAVAX  Completed  . PNA vac Low Risk Adult  Completed      Plan:    During the course of the visit the patient was educated and counseled about the following appropriate screening and preventive services:    Vaccines to include Pneumoccal, Influenza, Hepatitis B, Td, Zostavax, HCV/ up to date   Electrocardiogram/ 11/2013  Cardiovascular Disease/ reviewed risk; lipids; bp. Weight but feels like he is doing good for now.   Colorectal cancer screening/ 07/2006  Diabetes screening/ last one was 7.8 and started onglyza, so may drop more; Goal is 7.5   Prostate Cancer Screening completed by [facility]  Glaucoma screening/ eyes checked by Dr. Emily Filbert no diabetic retinopathy and no glaucoma  Nutrition counseling / watches diet; states if his sugar is elevated, he knows why/ tries to watch sweets.   Smoking cessation counseling  Patient Instructions (the written plan) was given to the patient.    Montine Circle, RN  03/21/2015

## 2015-03-21 NOTE — Progress Notes (Signed)
Subjective:    Patient ID: [name], male    DOB: 12-Oct-1940, 74 y.o.   MRN: [id number]  HPI  Here for wellness and f/u;  Overall doing ok;  Pt denies Chest pain, worsening SOB, DOE, wheezing, orthopnea, PND, worsening LE edema, palpitations, dizziness or syncope.  Pt denies neurological change such as new headache, facial or extremity weakness.  Pt denies polydipsia, polyuria, or low sugar symptoms. Pt states overall good compliance with treatment and medications, good tolerability, and has been trying to follow appropriate diet.  Pt denies worsening depressive symptoms, suicidal ideation or panic. No fever, night sweats, wt loss, loss of appetite, or other constitutional symptoms.  Pt states good ability with ADL's, has low fall risk, home safety reviewed and adequate, no other significant changes in hearing or vision, and only occasionally active with exercise.  Also seen regularly at [facility], has some labs to copy today.  S/p eye exam last wk, no change per pt.  Cont's to see endo at [facility], and next rheum in 3 months.  No specific complaints Past Medical History  Diagnosis Date  . THRUSH 11/06/2009  . HYPOTHYROIDISM 07/30/2009  . DIABETES MELLITUS, TYPE II 11/23/2009  . DEPRESSION 11/23/2009  . 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 HYPERTENSION 07/30/2009  . CORONARY ARTERY DISEASE 11/23/2009  . CHRONIC OBSTRUCTIVE PULMONARY DISEASE, ACUTE EXACERBATION 03/01/2010  . C O P D 07/30/2009  . 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 RESPIRATORY FAILURE, CHRONIC 07/31/2009  . BENIGN PROSTATIC HYPERTROPHY 11/23/2009  . DEGENERATIVE JOINT DISEASE 11/23/2009  . 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 FATIGUE 11/23/2009  . 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 GAIT DISTURBANCE 12/10/2009  . DYSPNEA/SHORTNESS OF BREATH 12/08/2009  . HEMOPTYSIS UNSPECIFIED 05/07/2010  . RA (rheumatoid arthritis) 06/11/2011  . PTSD (post-traumatic stress disorder) 03/10/2012  . Scleritis of both eyes 03/17/2014   Past Surgical History  Procedure  Laterality Date  . Small bowel obstruction repair with adhesiolysis    . Hx of remote ileum resection due to bleeding    . Abdominal aortic aneurysm repair    . S/p back surgury   1972    lumbar laminectomy Dr. Fannie Knee  . S/p left femoral embolectomy with left leg ischemia      Dr. Hart Rochester, vascular  . S/p right ganglion cyst      Dr. Teressa Senter    reports that he quit smoking about 16 years ago. His smoking use included Cigarettes, Pipe, and Cigars. He has a 100 pack-year smoking history. He has never used smokeless tobacco. He reports that he does not drink alcohol or use illicit drugs. family history is not on file. No Known Allergies Current Outpatient Prescriptions on File Prior to Visit  Medication Sig Dispense Refill  . albuterol (PROAIR HFA) 108 (90 BASE) MCG/ACT inhaler Inhale 2 puffs into the lungs every 4 (four) hours as needed. Shortness of breath 1 Inhaler 6  . albuterol (PROVENTIL) (2.5 MG/3ML) 0.083% nebulizer solution 1 vial in nebulizer every 6 hours as needed Dx 496 120 mL 6  . ALPRAZolam (XANAX) 0.5 MG tablet Take 0.5 mg by mouth at bedtime. For sleep and anxiety    . atorvastatin (LIPITOR) 40 MG tablet Take 10 mg by mouth daily.    . fluticasone (FLONASE) 50 MCG/ACT nasal spray Place 2 sprays into both nostrils daily. 16 g 2  . gabapentin (NEURONTIN) 800 MG tablet Take 800 mg by mouth 2 (two) times daily.      . insulin aspart (NOVOLOG) 100 UNIT/ML  injection 8-10 Units See admin instructions. Sliding scale as needed with meals (8-10 units at supper).    . insulin glargine (LANTUS) 100 UNIT/ML injection Inject 0.75 mLs (75 Units total) into the skin at bedtime. Can increase slowly by 5 units daily upto 70units depending on blood sugars during this week (Patient taking differently: Inject 70 Units into the skin at bedtime. Can increase slowly by 5 units daily upto 70units depending on blood sugars during this week) 10 mL   . Levothyroxine Sodium (SYNTHROID PO) Take 0.2 mg by mouth  daily. Pt states that he takes this medication that the [place] has given him.    [name] losartan (COZAAR) 50 MG tablet Take 1 tablet (50 mg total) by mouth daily. 30 tablet 0  . metFORMIN (GLUCOPHAGE) 1000 MG tablet Take 1,000 mg by mouth 2 (two) times daily.      . mometasone (ASMANEX 60 METERED DOSES) 220 MCG/INH inhaler Inhale 2 puffs into the lungs daily.      . Multiple Vitamins-Minerals (MEGA MULTIVITAMIN FOR MEN PO) Take by mouth daily.      [name] PLAQUENIL 200 MG tablet Take 1 tablet by mouth Daily.    . saxagliptin HCl (ONGLYZA) 5 MG TABS tablet Take 5 mg by mouth daily.    . sertraline (ZOLOFT) 100 MG tablet 100 mg. Take 1/2 by mouth daily as needed    . Tamsulosin HCl (FLOMAX) 0.4 MG CAPS Take by mouth at bedtime.      [name] tiotropium (SPIRIVA HANDIHALER) 18 MCG inhalation capsule Place 18 mcg into inhaler and inhale daily.      . naphazoline (CLEAR EYES) 0.012 % ophthalmic solution Place 1 drop into both eyes as needed. Red eyes      No current facility-administered medications on file prior to visit.    Review of Systems Constitutional: Negative for increased diaphoresis, other activity, appetite or siginficant weight change other than noted HENT: Negative for worsening hearing loss, ear pain, facial swelling, mouth sores and neck stiffness.   Eyes: Negative for other worsening pain, redness or visual disturbance.  Respiratory: Negative for shortness of breath and wheezing  Cardiovascular: Negative for chest pain and palpitations.  Gastrointestinal: Negative for diarrhea, blood in stool, abdominal distention or other pain Genitourinary: Negative for hematuria, flank pain or change in urine volume.  Musculoskeletal: Negative for myalgias or other joint complaints.  Skin: Negative for color change and wound or drainage.  Neurological: Negative for syncope and numbness. other than noted Hematological: Negative for adenopathy. or other swelling Psychiatric/Behavioral: Negative for hallucinations,  SI, self-injury, decreased concentration or other worsening agitation.      Objective:   Physical Exam BP 150/70 mmHg  Pulse 67  Temp(Src) 98 F (36.7 C) (Oral)  Ht 6\' 3"  (1.905 m)  Wt 252 lb 4 oz (114.42 kg)  BMI 31.53 kg/m2  SpO2 93% VS noted,  Constitutional: Pt is oriented to person, place, and time. Appears well-developed and well-nourished, in no significant distress Head: Normocephalic and atraumatic.  Right Ear: External ear normal.  Left Ear: External ear normal.  Nose: Nose normal.  Mouth/Throat: Oropharynx is clear and moist.  Eyes: Conjunctivae and EOM are normal. Pupils are equal, round, and reactive to light.  Neck: Normal range of motion. Neck supple. No JVD present. No tracheal deviation present or significant neck LA or mass Cardiovascular: Normal rate, regular rhythm, normal heart sounds and intact distal pulses.   Pulmonary/Chest: Effort normal and breath sounds decreased without rales or wheezing  Abdominal:  Soft. Bowel sounds are normal. NT. No HSM  Musculoskeletal: Normal range of motion. Exhibits no edema.  Lymphadenopathy:  Has no cervical adenopathy.  Neurological: Pt is alert and oriented to person, place, and time. Pt has normal reflexes. No cranial nerve deficit. Motor grossly intact Skin: Skin is warm and dry. No rash noted.  Psychiatric:  Has normal mood and affect. Behavior is normal.     Assessment & Plan:

## 2015-03-22 ENCOUNTER — Encounter: Payer: Self-pay | Admitting: Internal Medicine

## 2015-03-22 NOTE — Assessment & Plan Note (Signed)

## 2015-03-22 NOTE — Assessment & Plan Note (Signed)
stable overall by history and exam, recent data reviewed with pt, and pt to continue medical treatment as before,  to f/u any worsening symptoms or concerns Lab Results  Component Value Date   HGBA1C 7.7* 03/21/2015    

## 2015-04-03 ENCOUNTER — Encounter: Payer: Self-pay | Admitting: *Deleted

## 2015-04-05 ENCOUNTER — Ambulatory Visit (INDEPENDENT_AMBULATORY_CARE_PROVIDER_SITE_OTHER): Payer: Medicare Other | Admitting: Internal Medicine

## 2015-04-05 ENCOUNTER — Encounter: Payer: Self-pay | Admitting: Internal Medicine

## 2015-04-05 VITALS — BP 130/68 | HR 74 | Ht 75.0 in | Wt 250.4 lb

## 2015-04-05 DIAGNOSIS — I447 Left bundle-branch block, unspecified: Secondary | ICD-10-CM

## 2015-04-05 NOTE — Progress Notes (Signed)
Cardiology Office Note   Date:  04/05/2015   ID:  [name], DOB 1941-06-26, MRN [id number]  PCP:  [name], MD  Cardiologist:   [name], MD   No chief complaint on file.     History of Present Illness: [name] is a 74 y.o. male with a history of COPD and coronary calcifications. The patient has had 2 heart caths in past. One in early 2000s ([facility] [place]) showed one uclerated plaque. Rec medical Rx. Second was at [facility] he had no significant CAD I saw the pt back in November 2015  Since seen, the pt's  Breathing is rel unchanged     Current Outpatient Prescriptions  Medication Sig Dispense Refill  . albuterol (PROAIR HFA) 108 (90 BASE) MCG/ACT inhaler Inhale 2 puffs into the lungs every 4 (four) hours as needed. Shortness of breath 1 Inhaler 6  . albuterol (PROVENTIL) (2.5 MG/3ML) 0.083% nebulizer solution 1 vial in nebulizer every 6 hours as needed Dx 496 120 mL 6  . ALPRAZolam (XANAX) 0.5 MG tablet Take 0.5 mg by mouth at bedtime. For sleep and anxiety    . atorvastatin (LIPITOR) 40 MG tablet Take 10 mg by mouth daily.    . cycloSPORINE (RESTASIS) 0.05 % ophthalmic emulsion Place 1 drop into both eyes 2 (two) times daily.    . fluticasone (FLONASE) 50 MCG/ACT nasal spray Place 2 sprays into both nostrils daily. 16 g 2  . gabapentin (NEURONTIN) 800 MG tablet Take 800 mg by mouth 2 (two) times daily.      . hydroxychloroquine (PLAQUENIL) 200 MG tablet Take 200 mg by mouth daily.    . insulin glargine (LANTUS) 100 UNIT/ML injection Inject 70 Units into the skin at bedtime.    . Levothyroxine Sodium (SYNTHROID PO) Take 0.2 mg by mouth every morning.    [name] losartan (COZAAR) 50 MG tablet Take 1 tablet (50 mg total) by mouth daily. 30 tablet 0  . metFORMIN (GLUCOPHAGE) 1000 MG tablet Take 1,000 mg by mouth 2 (two) times daily with a meal.    . mometasone (ASMANEX 60 METERED DOSES) 220 MCG/INH inhaler Inhale 2 puffs into the lungs daily.      . Multiple  Vitamins-Minerals (MEGA MULTIVITAMIN FOR MEN PO) Take by mouth daily.      . naphazoline (CLEAR EYES) 0.012 % ophthalmic solution Place 1 drop into both eyes as needed. Red eyes     . Olodaterol HCl (STRIVERDI RESPIMAT) 2.5 MCG/ACT AERS Inhale 2.5 Inhalers into the lungs 1 day or 1 dose. 2 inhalations once a day    . saxagliptin HCl (ONGLYZA) 5 MG TABS tablet Take 5 mg by mouth daily.    . sertraline (ZOLOFT) 100 MG tablet 100 mg. Take 1/2 by mouth daily as needed    . Tamsulosin HCl (FLOMAX) 0.4 MG CAPS Take by mouth at bedtime.      [name] tiotropium (SPIRIVA HANDIHALER) 18 MCG inhalation capsule Place 18 mcg into inhaler and inhale daily.       No current facility-administered medications for this visit.    Allergies:   Review of patient's allergies indicates no known allergies.   Past Medical History  Diagnosis Date  . THRUSH 11/06/2009  . HYPOTHYROIDISM 07/30/2009  . DIABETES MELLITUS, TYPE II 11/23/2009  . DEPRESSION 11/23/2009  . 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 HYPERTENSION 07/30/2009  . CORONARY ARTERY DISEASE 11/23/2009  . CHRONIC OBSTRUCTIVE PULMONARY DISEASE, ACUTE EXACERBATION 03/01/2010  . C  O P D 07/30/2009  . 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 RESPIRATORY FAILURE, CHRONIC 07/31/2009  . BENIGN PROSTATIC HYPERTROPHY 11/23/2009  . DEGENERATIVE JOINT DISEASE 11/23/2009  . 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 FATIGUE 11/23/2009  . 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 GAIT DISTURBANCE 12/10/2009  . DYSPNEA/SHORTNESS OF BREATH 12/08/2009  . HEMOPTYSIS UNSPECIFIED 05/07/2010  . RA (rheumatoid arthritis) 06/11/2011  . PTSD (post-traumatic stress disorder) 03/10/2012  . Scleritis of both eyes 03/17/2014    Past Surgical History  Procedure Laterality Date  . Small bowel obstruction repair with adhesiolysis    . Hx of remote ileum resection due to bleeding    . Abdominal aortic aneurysm repair    . S/p back surgury   1972    lumbar laminectomy Dr. Fannie Knee  . S/p left femoral embolectomy with left leg  ischemia      Dr. Hart Rochester, vascular  . S/p right ganglion cyst      Dr. Teressa Senter     Social History:  The patient  reports that he quit smoking about 16 years ago. His smoking use included Cigarettes, Pipe, and Cigars. He has a 100 pack-year smoking history. He has never used smokeless tobacco. He reports that he does not drink alcohol or use illicit drugs.   Family History:  The patient's family history includes Other in his mother.    ROS:  Please see the history of present illness. All other systems are reviewed and  Negative to the above problem except as noted.    PHYSICAL EXAM: VS:  BP 130/68 mmHg  Pulse 74  Ht 6\' 3"  (1.905 m)  Wt 250 lb 6.4 oz (113.581 kg)  BMI 31.30 kg/m2  GEN: Well nourished, well developed, in no acute distress HEENT: normal Neck: no JVD, carotid bruits, or masses Cardiac: RRR; no murmurs, rubs, or gallops,no edema  Respiratory: Decreased airflow GI: soft, nontender, nondistended, + BS  No hepatomegaly  MS: no deformity Moving all extremities   Skin: warm and dry, no rash Neuro:  Strength and sensation are intact Psych: euthymic mood, full affect   EKG:  EKG is ordered today.  SR 74 bpm  First degree AV block  LBBB   Lipid Panel    Component Value Date/Time   CHOL 134 03/21/2015 1219   TRIG 100.0 03/21/2015 1219   HDL 34.10* 03/21/2015 1219   CHOLHDL 4 03/21/2015 1219   VLDL 20.0 03/21/2015 1219   LDLCALC 80 03/21/2015 1219   LDLDIRECT 69.0 09/20/2014 0912      Wt Readings from Last 3 Encounters:  04/05/15 250 lb 6.4 oz (113.581 kg)  03/21/15 252 lb 4 oz (114.42 kg)  12/11/14 252 lb (114.306 kg)      ASSESSMENT AND PLAN:  1.  CAD  Pt with coronary calcium on CT  Has had caths in past  I am not convinced of any active ischemia  Breathing is decreased but unchanged.  Follow   2.  HL  Keep on lipitor.  WIll need periodic f/u  3.  HTN  Continue on current meds  Adequate control  I would f/u in early summer 2017  Signed, 11-22-1993, MD  04/05/2015 4:46 PM    [facility] [address] [place], [place], [place]  [place] Phone: [phone]; Fax: [phone]

## 2015-04-05 NOTE — Patient Instructions (Signed)
Your physician recommends that you continue on your current medications as directed. Please refer to the Current Medication list given to you today. Your physician wants you to follow-up in: May, 2017 with Dr. Ross.  You will receive a reminder letter in the mail two months in advance. If you don't receive a letter, please call our office to schedule the follow-up appointment.  

## 2015-05-11 ENCOUNTER — Encounter: Payer: Self-pay | Admitting: Internal Medicine

## 2015-05-11 ENCOUNTER — Ambulatory Visit (INDEPENDENT_AMBULATORY_CARE_PROVIDER_SITE_OTHER): Payer: Medicare Other | Admitting: Internal Medicine

## 2015-05-11 VITALS — BP 152/78 | HR 73 | Ht 75.0 in | Wt 251.0 lb

## 2015-05-11 DIAGNOSIS — J9611 Chronic respiratory failure with hypoxia: Secondary | ICD-10-CM | POA: Diagnosis not present

## 2015-05-11 DIAGNOSIS — J449 Chronic obstructive pulmonary disease, unspecified: Secondary | ICD-10-CM

## 2015-05-11 NOTE — Progress Notes (Signed)
Subjective:     Patient ID: [name], male   DOB: 05/28/1941, 74 y.o.   MRN: [id number]  HPI   OV 05/11/2015  Chief Complaint  Patient presents with  . Follow-up    Pt states his breathing is unchanged since last OV. Pt decided to not proceed with innogen O2 system. Pt denies cough and CP/tightness.      Follow-up Gold stage III with chronic respiratory failure [does not have interstitial lung disease on 2015 CT chest] in a patient with . Known rheumatoid arthritis and diabetes. Ex-smoker  - COPD: Last visit Jan 2016 and may 2016: Since then copd is stable. No new issues. No AECOPD. Decided against innogen 02 system. He had a flu shot and is up-to-date this season. He had a high dose flu shot of note. He is trying to lose weight again     Immunization History  Administered Date(s) Administered  . Influenza Split 04/14/2011, 04/27/2013  . Influenza Whole 05/28/2009, 04/27/2010, 03/31/2012  . Influenza, High Dose Seasonal PF 03/21/2015  . Influenza,inj,Quad PF,36+ Mos 03/17/2014  . Pneumococcal Conjugate-13 09/20/2013  . Pneumococcal Polysaccharide-23 05/28/2008  . Pneumococcal-Unspecified 04/27/2013  . Td 11/23/2009  . Zoster 04/28/2009     Current outpatient prescriptions:  .  albuterol (PROAIR HFA) 108 (90 BASE) MCG/ACT inhaler, Inhale 2 puffs into the lungs every 4 (four) hours as needed. Shortness of breath, Disp: 1 Inhaler, Rfl: 6 .  albuterol (PROVENTIL) (2.5 MG/3ML) 0.083% nebulizer solution, 1 vial in nebulizer every 6 hours as needed Dx 496, Disp: 120 mL, Rfl: 6 .  ALPRAZolam (XANAX) 0.5 MG tablet, Take 0.5 mg by mouth at bedtime. For sleep and anxiety, Disp: , Rfl:  .  atorvastatin (LIPITOR) 40 MG tablet, Take 10 mg by mouth daily., Disp: , Rfl:  .  cycloSPORINE (RESTASIS) 0.05 % ophthalmic emulsion, Place 1 drop into both eyes 2 (two) times daily., Disp: , Rfl:  .  fluticasone (FLONASE) 50 MCG/ACT nasal spray, Place 2 sprays into both nostrils daily.  (Patient taking differently: Place 2 sprays into both nostrils daily as needed. ), Disp: 16 g, Rfl: 2 .  gabapentin (NEURONTIN) 800 MG tablet, Take 800 mg by mouth 2 (two) times daily.  , Disp: , Rfl:  .  hydroxychloroquine (PLAQUENIL) 200 MG tablet, Take 200 mg by mouth daily., Disp: , Rfl:  .  insulin glargine (LANTUS) 100 UNIT/ML injection, Inject 70 Units into the skin at bedtime., Disp: , Rfl:  .  Levothyroxine Sodium (SYNTHROID PO), Take 0.2 mg by mouth every morning., Disp: , Rfl:  .  losartan (COZAAR) 50 MG tablet, Take 1 tablet (50 mg total) by mouth daily., Disp: 30 tablet, Rfl: 0 .  metFORMIN (GLUCOPHAGE) 1000 MG tablet, Take 1,000 mg by mouth 2 (two) times daily with a meal., Disp: , Rfl:  .  mometasone (ASMANEX 60 METERED DOSES) 220 MCG/INH inhaler, Inhale 2 puffs into the lungs daily.  , Disp: , Rfl:  .  Multiple Vitamins-Minerals (MEGA MULTIVITAMIN FOR MEN PO), Take by mouth daily.  , Disp: , Rfl:  .  Olodaterol HCl (STRIVERDI RESPIMAT) 2.5 MCG/ACT AERS, Inhale 2.5 Inhalers into the lungs 1 day or 1 dose. 2 inhalations once a day, Disp: , Rfl:  .  saxagliptin HCl (ONGLYZA) 5 MG TABS tablet, Take 5 mg by mouth daily., Disp: , Rfl:  .  sertraline (ZOLOFT) 100 MG tablet, 100 mg. Take 1/2 by mouth daily as needed, Disp: , Rfl:  .  Tamsulosin HCl (FLOMAX)  0.4 MG CAPS, Take by mouth at bedtime.  , Disp: , Rfl:  .  tiotropium (SPIRIVA HANDIHALER) 18 MCG inhalation capsule, Place 18 mcg into inhaler and inhale daily.  , Disp: , Rfl:     Review of Systems  ROS The following are not active complaints unless bolded  sore throat, dysphagia, dental problems, itching, sneezing, nasal congestion or excess/ purulent secretions, ear ache,  fever, chills, sweats, unintended wt loss, classically pleuritic or exertional cp, hemoptysis, orthopnea pnd or leg swelling, presyncope, palpitations, abdominal pain, anorexia, nausea, vomiting, diarrhea or change in bowel or bladder habits, change in  stools or urine, dysuria,hematuria, rash, arthralgias, visual complaints, headache, numbness, weakness or ataxia or problems with walking or coordination, change in mood/affect or memory.      Objective:   Physical Exam  Constitutional: He is oriented to person, place, and time. He appears well-developed and well-nourished. No distress.  Body mass index is 31.37 kg/(m^2).   HENT:  Head: Normocephalic and atraumatic.  Right Ear: External ear normal.  Left Ear: External ear normal.  Mouth/Throat: Oropharynx is clear and moist. No oropharyngeal exudate.  Eyes: Conjunctivae and EOM are normal. Pupils are equal, round, and reactive to light. Right eye exhibits no discharge. Left eye exhibits no discharge. No scleral icterus.  Neck: Normal range of motion. Neck supple. No JVD present. No tracheal deviation present. No thyromegaly present.  Cardiovascular: Normal rate, regular rhythm and intact distal pulses.  Exam reveals no gallop and no friction rub.   No murmur heard. Pulmonary/Chest: Effort normal and breath sounds normal. No respiratory distress. He has no wheezes. He has no rales. He exhibits no tenderness.  02 on Purse lip breathing Overall air entry dminished'RLL cracjkles  -all baseline  Abdominal: Soft. Bowel sounds are normal. He exhibits no distension and no mass. There is no tenderness. There is no rebound and no guarding.  Musculoskeletal: Normal range of motion. He exhibits no edema or tenderness.  Varicose vein  Lymphadenopathy:    He has no cervical adenopathy.  Neurological: He is alert and oriented to person, place, and time. He has normal reflexes. No cranial nerve deficit. Coordination normal.  Skin: Skin is warm and dry. No rash noted. He is not diaphoretic. No erythema. No pallor.  Psychiatric: He has a normal mood and affect. His behavior is normal. Judgment and thought content normal.  Nursing note and vitals reviewed.   Filed Vitals:   05/11/15 1437  BP:  152/78  Pulse: 73  Height: 6\' 3"  (1.905 m)  Weight: 251 lb (113.853 kg)  SpO2: 94%        Assessment:       ICD-9-CM ICD-10-CM   1. Chronic respiratory failure with hypoxia (HCC) 518.83 J96.11    799.02    2. 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 COntinue meds and o2 Glad upto date with flu shot  Followup  6-7 months or sooner if needed Discuss imagnig at next visit - last in 2014   Dr. 2015, M.D., [facility].C.P Pulmonary and Critical Care Medicine Staff Physician  System [place] [facility] Pager: [phone], If no answer or between  15:00h - 7:00h: call [phone]  05/11/2015 3:03 PM

## 2015-05-11 NOTE — Patient Instructions (Addendum)
  COPD is stable. COntinue meds and o2 Glad upto date with flu shot  Followup  6-7 months or sooner if needed Discuss imagnig at next visit - last in 2014

## 2015-09-27 ENCOUNTER — Telehealth: Payer: Self-pay | Admitting: Internal Medicine

## 2015-09-27 NOTE — Telephone Encounter (Signed)
Called and spoke with the pt. Pt states it is the [facility] in [place], [place] and he will be receiving the transfusion at their [place] clinic.Pt states he would like MR's recs on the procedure it's self. MR please advise

## 2015-09-27 NOTE — Telephone Encounter (Signed)
Spoke with pt to relay MR's recs.  Pt expressed understanding.  Nothing further needed.

## 2015-09-27 NOTE — Telephone Encounter (Signed)
I am not trained in stem cell to give rec I do not know enough about stem cell Rx for lung disease to give rec However,  -The lung disease socieities rec against stem cell as a Rx . It is viewed as experimental - I have seen ads in [place] for steml cell Rx out of [place] and other places esp in [place] and if one reads carefully it will say "FDA approved protocol" which means experimental - I have heard rumors that some patients with lung disease are better but neve met one and cannot recommend it  Dr. Brand Males, M.D., [facility].C.P Pulmonary and Critical Care Medicine Staff Physician [place] [facility] Pager: [phone], If no answer or between  15:00h - 7:00h: call [phone]  09/27/2015 11:15 AM

## 2015-09-27 NOTE — Telephone Encounter (Signed)
Spoke with pt, states he is considering doing a stem cell transfusion in [place]-requesting MR's recs on this.  Pt has a consult scheduled for this on 3/28.  Pt is unsure who the doctor is who is overseeing this.  Pt would like MR's recs before starting this process.  MR please advise on recs. Thanks!

## 2015-09-27 NOTE — Telephone Encounter (Signed)
Is it at [facility]? Need details first

## 2015-10-02 ENCOUNTER — Ambulatory Visit (INDEPENDENT_AMBULATORY_CARE_PROVIDER_SITE_OTHER)
Admission: RE | Admit: 2015-10-02 | Discharge: 2015-10-02 | Disposition: A | Discharge status: Home / Self Care | Payer: Medicare Other | Source: Ambulatory Visit | Attending: Internal Medicine | Admitting: Internal Medicine

## 2015-10-02 ENCOUNTER — Encounter: Payer: Self-pay | Admitting: Internal Medicine

## 2015-10-02 ENCOUNTER — Other Ambulatory Visit (INDEPENDENT_AMBULATORY_CARE_PROVIDER_SITE_OTHER): Payer: Medicare Other

## 2015-10-02 ENCOUNTER — Ambulatory Visit (INDEPENDENT_AMBULATORY_CARE_PROVIDER_SITE_OTHER): Payer: Medicare Other | Admitting: Internal Medicine

## 2015-10-02 VITALS — BP 140/86 | HR 81 | Temp 98.6°F | Resp 20 | Wt 252.0 lb

## 2015-10-02 DIAGNOSIS — R7989 Other specified abnormal findings of blood chemistry: Secondary | ICD-10-CM

## 2015-10-02 DIAGNOSIS — I10 Essential (primary) hypertension: Secondary | ICD-10-CM | POA: Diagnosis not present

## 2015-10-02 DIAGNOSIS — E785 Hyperlipidemia, unspecified: Secondary | ICD-10-CM

## 2015-10-02 DIAGNOSIS — J9611 Chronic respiratory failure with hypoxia: Secondary | ICD-10-CM | POA: Diagnosis not present

## 2015-10-02 DIAGNOSIS — L84 Corns and callosities: Secondary | ICD-10-CM

## 2015-10-02 DIAGNOSIS — Z0001 Encounter for general adult medical examination with abnormal findings: Secondary | ICD-10-CM | POA: Diagnosis not present

## 2015-10-02 DIAGNOSIS — E119 Type 2 diabetes mellitus without complications: Secondary | ICD-10-CM

## 2015-10-02 DIAGNOSIS — Z0000 Encounter for general adult medical examination without abnormal findings: Secondary | ICD-10-CM

## 2015-10-02 LAB — CBC WITH DIFFERENTIAL/PLATELET
BASOS ABS: 0 10*3/uL (ref 0.0–0.1)
BASOS PCT: 0.2 % (ref 0.0–3.0)
EOS ABS: 0.3 10*3/uL (ref 0.0–0.7)
Eosinophils Relative: 3.2 % (ref 0.0–5.0)
HEMATOCRIT: 38.1 % — AB (ref 39.0–52.0)
HEMOGLOBIN: 12.4 g/dL — AB (ref 13.0–17.0)
LYMPHS PCT: 21.1 % (ref 12.0–46.0)
Lymphs Abs: 1.7 10*3/uL (ref 0.7–4.0)
MCHC: 32.5 g/dL (ref 30.0–36.0)
MCV: 80.8 fl (ref 78.0–100.0)
Monocytes Absolute: 0.7 10*3/uL (ref 0.1–1.0)
Monocytes Relative: 8.6 % (ref 3.0–12.0)
Neutro Abs: 5.3 10*3/uL (ref 1.4–7.7)
Neutrophils Relative %: 66.9 % (ref 43.0–77.0)
Platelets: 147 10*3/uL — ABNORMAL LOW (ref 150.0–400.0)
RBC: 4.72 Mil/uL (ref 4.22–5.81)
RDW: 15.2 % (ref 11.5–15.5)
WBC: 7.9 10*3/uL (ref 4.0–10.5)

## 2015-10-02 LAB — URINALYSIS, ROUTINE W REFLEX MICROSCOPIC
BILIRUBIN URINE: NEGATIVE
HGB URINE DIPSTICK: NEGATIVE
KETONES UR: NEGATIVE
LEUKOCYTES UA: NEGATIVE
NITRITE: NEGATIVE
RBC / HPF: NONE SEEN (ref 0–?)
Specific Gravity, Urine: >=1.03 — AB (ref 1.000–1.030)
URINE GLUCOSE: NEGATIVE
Urobilinogen, UA: 1 (ref 0.0–1.0)
pH: 6 (ref 5.0–8.0)

## 2015-10-02 LAB — HEPATIC FUNCTION PANEL
ALT: 25 U/L (ref 0–53)
AST: 24 U/L (ref 0–37)
Albumin: 4.4 g/dL (ref 3.5–5.2)
Alkaline Phosphatase: 68 U/L (ref 39–117)
BILIRUBIN TOTAL: 0.4 mg/dL (ref 0.2–1.2)
Bilirubin, Direct: 0.1 mg/dL (ref 0.0–0.3)
Total Protein: 6.7 g/dL (ref 6.0–8.3)

## 2015-10-02 LAB — LDL CHOLESTEROL, DIRECT: Direct LDL: 66 mg/dL

## 2015-10-02 LAB — TSH: TSH: 0.45 u[IU]/mL (ref 0.35–4.50)

## 2015-10-02 LAB — BASIC METABOLIC PANEL
BUN: 11 mg/dL (ref 6–23)
CHLORIDE: 105 meq/L (ref 96–112)
CO2: 28 mEq/L (ref 19–32)
Calcium: 9.7 mg/dL (ref 8.4–10.5)
Creatinine, Ser: 0.82 mg/dL (ref 0.40–1.50)
GFR: 97.59 mL/min (ref >60.00)
Glucose, Bld: 169 mg/dL — ABNORMAL HIGH (ref 70–99)
POTASSIUM: 4.8 meq/L (ref 3.5–5.1)
SODIUM: 144 meq/L (ref 135–145)

## 2015-10-02 LAB — LIPID PANEL
CHOL/HDL RATIO: 5
CHOLESTEROL: 148 mg/dL (ref 0–200)
HDL: 32.8 mg/dL — ABNORMAL LOW (ref >39.00)
NonHDL: 115.67
Triglycerides: 290 mg/dL — ABNORMAL HIGH (ref 0.0–149.0)
VLDL: 58 mg/dL — AB (ref 0.0–40.0)

## 2015-10-02 LAB — PSA: PSA: 0.46 ng/mL (ref 0.10–4.00)

## 2015-10-02 LAB — HEMOGLOBIN A1C: HEMOGLOBIN A1C: 7.8 % — AB (ref 4.6–6.5)

## 2015-10-02 LAB — MICROALBUMIN / CREATININE URINE RATIO
CREATININE, U: 209.2 mg/dL
MICROALB UR: 4.4 mg/dL — AB (ref 0.0–1.9)
Microalb Creat Ratio: 2.1 mg/g (ref 0.0–30.0)

## 2015-10-02 NOTE — Assessment & Plan Note (Signed)
stable overall by history and exam, recent data reviewed with pt, and pt to continue medical treatment as before,  to f/u any worsening symptoms or concerns BP Readings from Last 3 Encounters:  10/02/15 140/86  05/11/15 152/78  04/05/15 130/68

## 2015-10-02 NOTE — Assessment & Plan Note (Addendum)
For pre stem cell tx cxr per pt request, with worsening but acceptable sats, cont same tx for now SpO2 Readings from Last 3 Encounters:  10/02/15 89%  05/11/15 94%  03/21/15 93%

## 2015-10-02 NOTE — Patient Instructions (Addendum)
Please continue all other medications as before, and refills have been done if requested.  Please have the pharmacy call with any other refills you may need.  Please continue your efforts at being more active, low cholesterol diet, and weight control.  You are otherwise up to date with prevention measures today.  Please keep your appointments with your specialists as you may have planned  You will be contacted regarding the referral for: podiatry  Please go to the XRAY Department in the Basement (go straight as you get off the elevator) for the x-ray testing  Please go to the LAB in the Basement (turn left off the elevator) for the tests to be done today  You will be contacted by phone if any changes need to be made immediately.  Otherwise, you will receive a letter about your results with an explanation, but please check with MyChart first.  Please remember to sign up for MyChart if you have not done so, as this will be important to you in the future with finding out test results, communicating by private email, and scheduling acute appointments online when needed.  Please return in 6 months, or sooner if needed, with Lab testing done 3-5 days before

## 2015-10-02 NOTE — Assessment & Plan Note (Signed)
stable overall by history and exam, recent data reviewed with pt, and pt to continue medical treatment as before,  to f/u any worsening symptoms or concerns Lab Results  Component Value Date   HGBA1C 7.7* 03/21/2015

## 2015-10-02 NOTE — Assessment & Plan Note (Signed)

## 2015-10-02 NOTE — Progress Notes (Signed)
Subjective:    Patient ID: [name], male    DOB: 1941/01/19, 75 y.o.   MRN: [id number]  HPI   Here for wellness and f/u;  Overall doing ok;  Pt denies Chest pain, worsening SOB, DOE, wheezing, orthopnea, PND, worsening LE edema, palpitations, dizziness or syncope.  Pt denies neurological change such as new headache, facial or extremity weakness.  Pt denies polydipsia, polyuria, or low sugar symptoms. Pt states overall good compliance with treatment and medications, good tolerability, and has been trying to follow appropriate diet.  Pt denies worsening depressive symptoms, suicidal ideation or panic. No fever, night sweats, wt loss, loss of appetite, or other constitutional symptoms.  Pt states good ability with ADL's, has low fall risk, home safety reviewed and adequate, no other significant changes in hearing or vision, and only occasionally active with exercise, stopped going to the gym to avoid other persons who might be sick.   Does have plantar aspect left foot pain with ambulation in last few wks,thinks he has a corn.   Wt Readings from Last 3 Encounters:  10/02/15 252 lb (114.306 kg)  05/11/15 251 lb (113.853 kg)  04/05/15 250 lb 6.4 oz (113.581 kg)   BP Readings from Last 3 Encounters:  10/02/15 140/86  05/11/15 152/78  04/05/15 130/68   Past Medical History  Diagnosis Date  . THRUSH 11/06/2009  . HYPOTHYROIDISM 07/30/2009  . DIABETES MELLITUS, TYPE II 11/23/2009  . DEPRESSION 11/23/2009  . 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 HYPERTENSION 07/30/2009  . CORONARY ARTERY DISEASE 11/23/2009  . CHRONIC OBSTRUCTIVE PULMONARY DISEASE, ACUTE EXACERBATION 03/01/2010  . C O P D 07/30/2009  . 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 RESPIRATORY FAILURE, CHRONIC 07/31/2009  . BENIGN PROSTATIC HYPERTROPHY 11/23/2009  . DEGENERATIVE JOINT DISEASE 11/23/2009  . 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 FATIGUE 11/23/2009  . 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 GAIT DISTURBANCE 12/10/2009  . DYSPNEA/SHORTNESS OF  BREATH 12/08/2009  . HEMOPTYSIS UNSPECIFIED 05/07/2010  . RA (rheumatoid arthritis) (HCC) 06/11/2011  . PTSD (post-traumatic stress disorder) 03/10/2012  . Scleritis of both eyes 03/17/2014   Past Surgical History  Procedure Laterality Date  . Small bowel obstruction repair with adhesiolysis    . Hx of remote ileum resection due to bleeding    . Abdominal aortic aneurysm repair    . S/p back surgury   1972    lumbar laminectomy Dr. Fannie Knee  . S/p left femoral embolectomy with left leg ischemia      Dr. Hart Rochester, vascular  . S/p right ganglion cyst      Dr. Teressa Senter    reports that he quit smoking about 17 years ago. His smoking use included Cigarettes, Pipe, and Cigars. He has a 100 pack-year smoking history. He has never used smokeless tobacco. He reports that he does not drink alcohol or use illicit drugs. family history includes Other in his mother. No Known Allergies  Review of Systems Constitutional: Negative for increased diaphoresis, other activity, appetite or siginficant weight change other than noted HENT: Negative for worsening hearing loss, ear pain, facial swelling, mouth sores and neck stiffness.   Eyes: Negative for other worsening pain, redness or visual disturbance.  Respiratory: Negative for shortness of breath and wheezing  Cardiovascular: Negative for chest pain and palpitations.  Gastrointestinal: Negative for diarrhea, blood in stool, abdominal distention or other pain Genitourinary: Negative for hematuria, flank pain or change in urine volume.  Musculoskeletal: Negative for myalgias or other joint complaints.  Skin: Negative for color  change and wound or drainage.  Neurological: Negative for syncope and numbness. other than noted Hematological: Negative for adenopathy. or other swelling Psychiatric/Behavioral: Negative for hallucinations, SI, self-injury, decreased concentration or other worsening agitation.      Objective:   Physical Exam BP 140/86 mmHg  Pulse 81   Temp(Src) 98.6 F (37 C) (Oral)  Resp 20  Wt 252 lb (114.306 kg)  SpO2 89%, on home o2 2L VS noted, not ill appearing Constitutional: Pt is oriented to person, place, and time. Appears well-developed and well-nourished, in no significant distress Head: Normocephalic and atraumatic.  Right Ear: External ear normal.  Left Ear: External ear normal.  Nose: Nose normal.  Mouth/Throat: Oropharynx is clear and moist.  Eyes: Conjunctivae and EOM are normal. Pupils are equal, round, and reactive to light.  Neck: Normal range of motion. Neck supple. No JVD present. No tracheal deviation present or significant neck LA or mass Cardiovascular: Normal rate, regular rhythm, normal heart sounds and intact distal pulses.   Pulmonary/Chest: Effort normal and breath sounds decreased without rales or wheezing  Abdominal: Soft. Bowel sounds are normal. NT. No HSM  Musculoskeletal: Normal range of motion. Exhibits no edema.  Lymphadenopathy:  Has no cervical adenopathy.  Neurological: Pt is alert and oriented to person, place, and time. Pt has normal reflexes. No cranial nerve deficit. Motor grossly intact Skin: Skin is warm and dry. No rash noted. left mid plantar distal foot with tender corn lesion sub q Psychiatric:  Has normal mood and affect. Behavior is normal.         Assessment & Plan:

## 2015-10-02 NOTE — Assessment & Plan Note (Signed)
Will need podaitry referral,  to f/u any worsening symptoms or concerns

## 2015-10-02 NOTE — Assessment & Plan Note (Signed)
stable overall by history and exam, recent data reviewed with pt, and pt to continue medical treatment as before,  to f/u any worsening symptoms or concerns. Lab Results  Component Value Date   LDLCALC 80 03/21/2015   Goal ldl < 70, for f/u lab

## 2015-10-02 NOTE — Progress Notes (Signed)
Pre visit review using our clinic review tool, if applicable. No additional management support is needed unless otherwise documented below in the visit note. 

## 2015-10-03 ENCOUNTER — Other Ambulatory Visit: Payer: Self-pay | Admitting: Internal Medicine

## 2015-10-03 DIAGNOSIS — R222 Localized swelling, mass and lump, trunk: Secondary | ICD-10-CM

## 2015-10-08 ENCOUNTER — Telehealth: Payer: Self-pay

## 2015-10-08 NOTE — Telephone Encounter (Signed)
[facility] faxed request for pt records with results for CBC, CMP and chest xray.  This has been printed and faxed per PCP instructions. [phone] - phone and [phone] - fax).

## 2015-10-08 NOTE — Telephone Encounter (Signed)
Sharhonda from the [facility] would also like office notes from pts visit on that day

## 2015-10-08 NOTE — Telephone Encounter (Signed)
This has been faxed per their request.

## 2015-10-09 ENCOUNTER — Ambulatory Visit (HOSPITAL_BASED_OUTPATIENT_CLINIC_OR_DEPARTMENT_OTHER)
Admission: RE | Admit: 2015-10-09 | Discharge: 2015-10-09 | Disposition: A | Discharge status: Home / Self Care | Payer: Medicare Other | Source: Ambulatory Visit | Attending: Internal Medicine | Admitting: Internal Medicine

## 2015-10-09 ENCOUNTER — Encounter (HOSPITAL_BASED_OUTPATIENT_CLINIC_OR_DEPARTMENT_OTHER): Payer: Self-pay

## 2015-10-09 DIAGNOSIS — R222 Localized swelling, mass and lump, trunk: Secondary | ICD-10-CM | POA: Diagnosis present

## 2015-10-09 DIAGNOSIS — Q250 Patent ductus arteriosus: Secondary | ICD-10-CM | POA: Insufficient documentation

## 2015-10-09 DIAGNOSIS — J439 Emphysema, unspecified: Secondary | ICD-10-CM | POA: Diagnosis not present

## 2015-10-09 DIAGNOSIS — I2510 Atherosclerotic heart disease of native coronary artery without angina pectoris: Secondary | ICD-10-CM | POA: Diagnosis not present

## 2015-10-09 MED ORDER — IOHEXOL 350 MG/ML SOLN
68.0000 mL | Freq: Once | INTRAVENOUS | Status: AC | PRN
  Administered 2015-10-09: 68 mL via INTRAVENOUS

## 2015-10-10 ENCOUNTER — Telehealth: Payer: Self-pay | Admitting: Internal Medicine

## 2015-10-10 NOTE — Telephone Encounter (Signed)
LVM for [name] to return our call.

## 2015-10-10 NOTE — Telephone Encounter (Signed)
Maudie Mercury is calling back requesting results from patients CT scan She can be reached at [phone]

## 2015-10-10 NOTE — Telephone Encounter (Signed)
[name] from [employer] returned call - [phone]. Calling to see if CT scan is ready to be faxed over.  She states her fax # is the same as her phone #.

## 2015-10-10 NOTE — Telephone Encounter (Signed)
Spoke with [place]. Advised her that we did not order the most recent CT scan. Pt's PCP did. Merita Norton will contact Dr. Raphael Gibney office. Nothing further was needed at this time.

## 2015-11-02 IMAGING — CT CT CTA ABD/PEL W/CM AND/OR W/O CM
2 of 7 series · 13 of 46 positions shown, 15 images · IV contrast (APPLIED)
Comparison: 07/15/2010 and earlier studies

CLINICAL DATA: Right low back pain. Previous aortic aneurysm
repair.

EXAM:
CT ANGIOGRAPHY CHEST, ABDOMEN AND PELVIS
TECHNIQUE: Multidetector CT imaging through the chest, abdomen and pelvis was
performed using the standard protocol during bolus administration of
intravenous contrast. Multiplanar reconstructed images including
MIPs were obtained and reviewed to evaluate the vascular anatomy.
CONTRAST:  125mL OMNIPAQUE IOHEXOL 350 MG/ML SOLN

[Series 4: dissection 2.0 b26f · axial · 0.94mm/px · z∈[+840,+1468]mm · 10 of 354 slices shown, 12 images]
[im 20/354  soft-tissue]
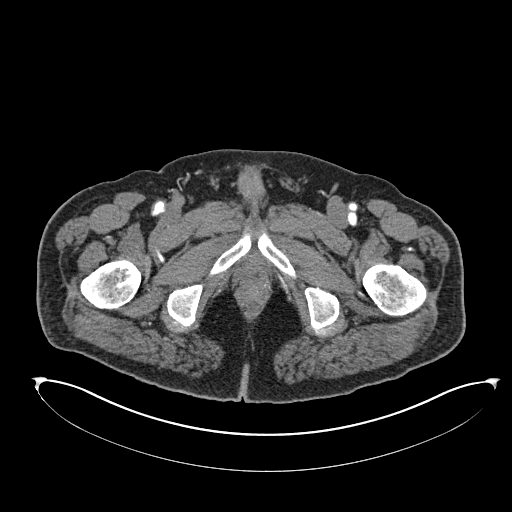
[im 20/354  bone]
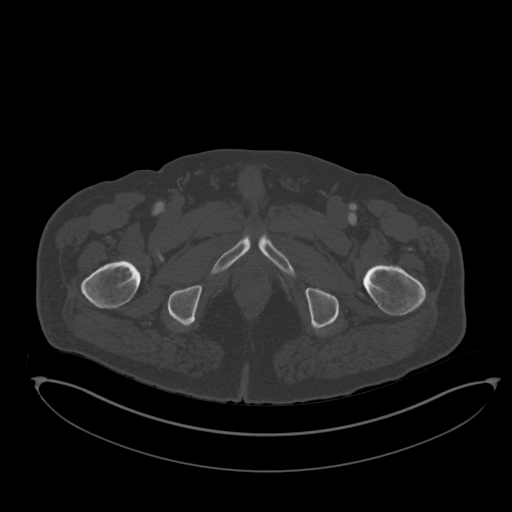
[im 59/354  soft-tissue]
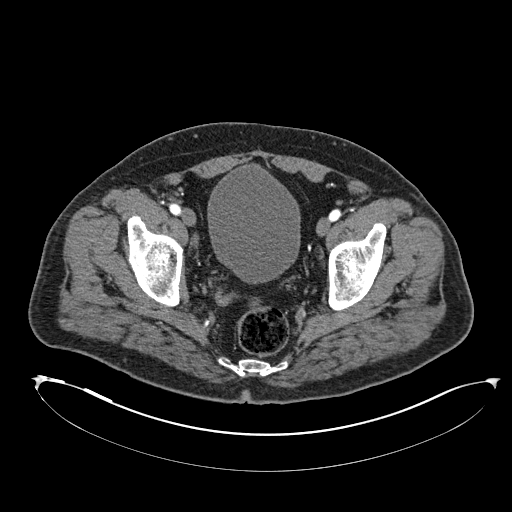
[im 99/354  soft-tissue]
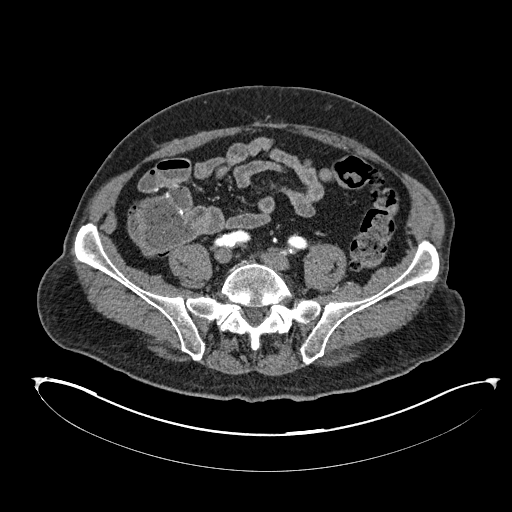
[im 118/354  soft-tissue]
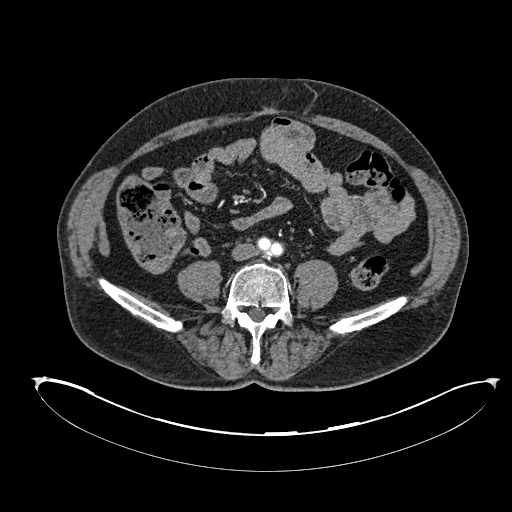
[im 157/354  soft-tissue]
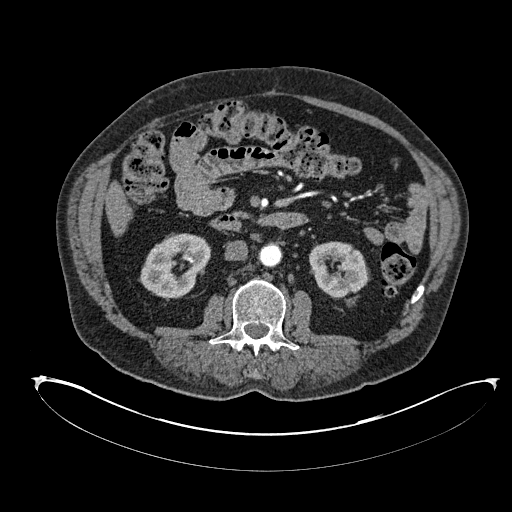
[im 197/354  soft-tissue]
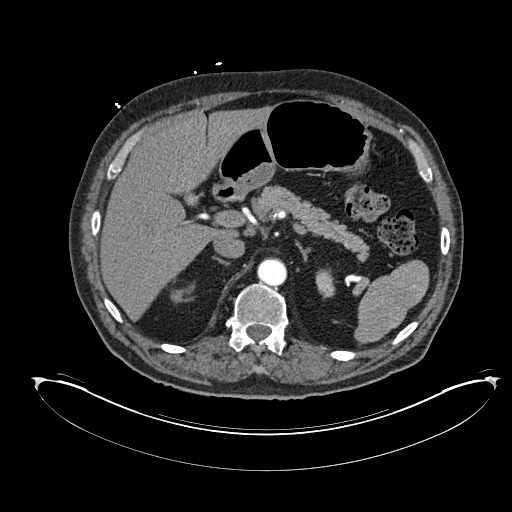
[im 236/354  soft-tissue]
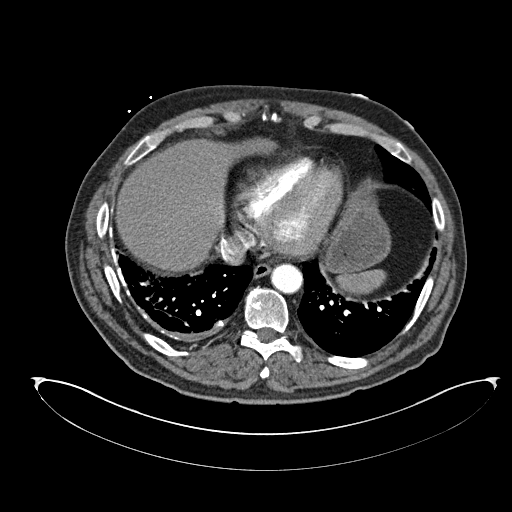
[im 255/354  soft-tissue]
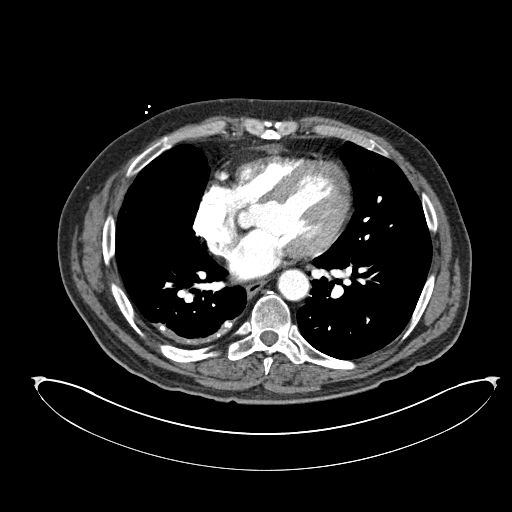
[im 295/354  soft-tissue]
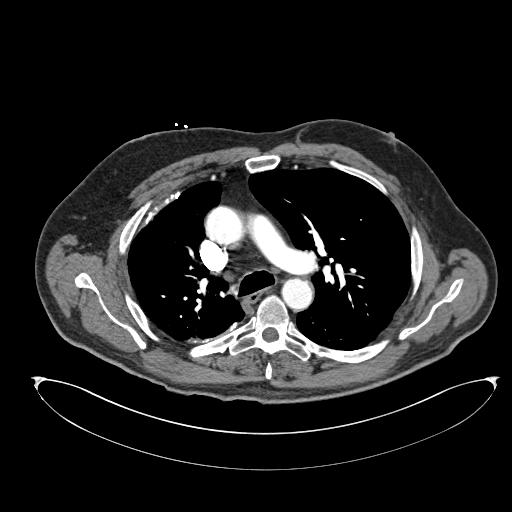
[im 295/354  bone]
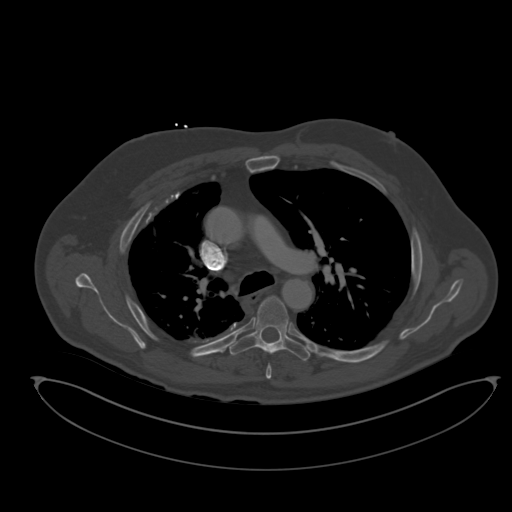
[im 334/354  soft-tissue]
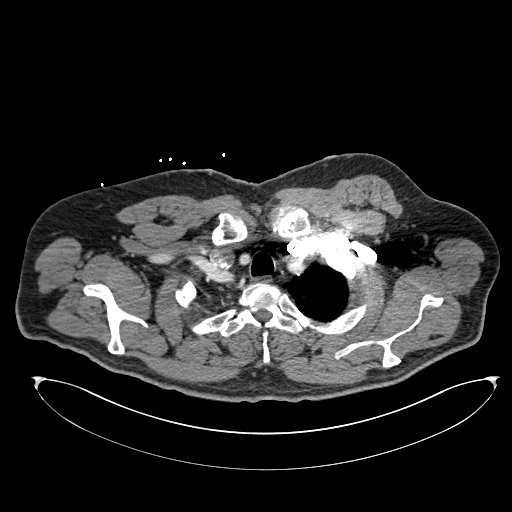

[Series 7: dissection 2.0 coronal · coronal · 0.81mm/px · 3 of 163 slices shown]
[im 41/163  soft-tissue]
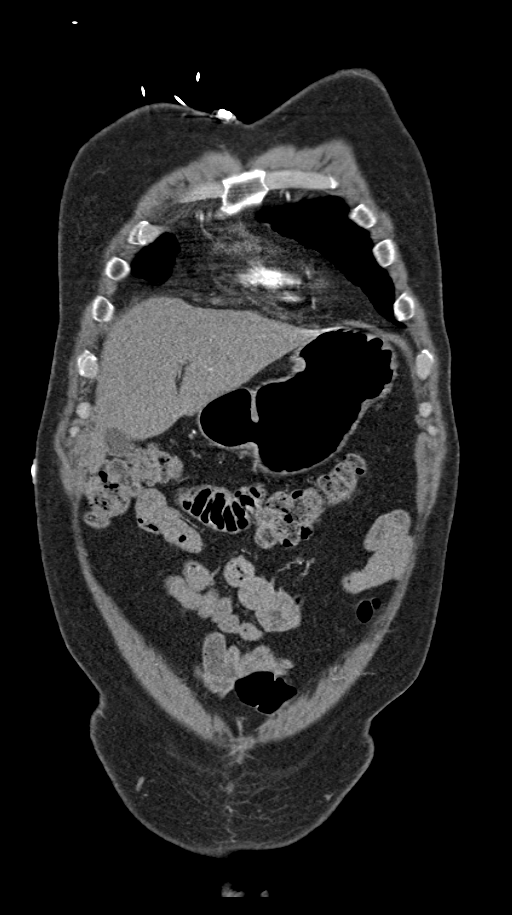
[im 82/163  soft-tissue]
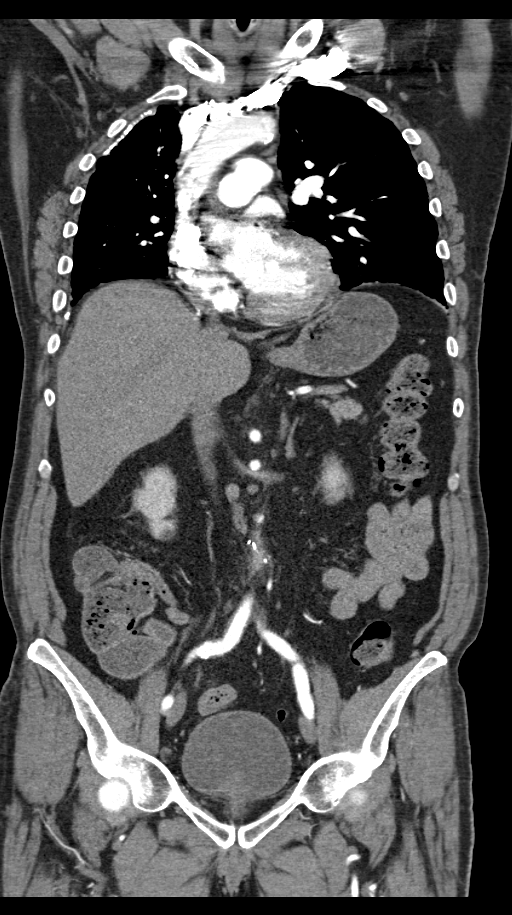
[im 122/163  soft-tissue]
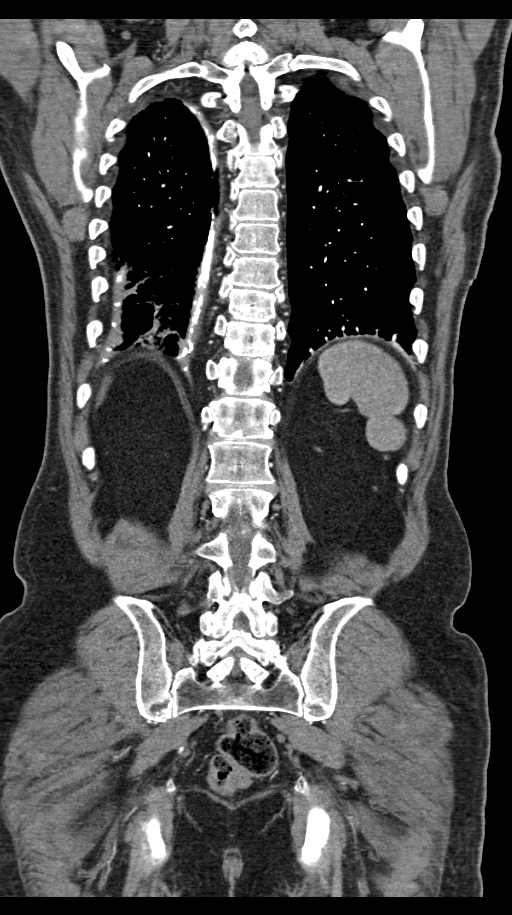

[13 of 46 positions shown; findings below may reference images not displayed]

FINDINGS: CTA CHEST FINDINGS

Patchy coronary calcifications. Ascending aorta unremarkable. There
is a ductus diverticulum measuring approximately 28 mm diameter
(previously 27 mm) with scattered mural calcifications. Descending
thoracic aorta unremarkable. There is fairly good contrast
opacification of pulmonary artery branches ; the exam was not
optimized for detection of pulmonary emboli.

Partially calcified right pleural plaques. Subcentimeter
prevascular, AP window, pretracheal, and precarinal lymph nodes. No
hilar adenopathy. Moderately advanced emphysematous changes in both
lungs with chronic linear scarring or atelectasis in the posterior
and lateral basal segments left lower lobe. Thoracic spine and
sternum intact.

Review of the MIP images confirms the above findings.

CTA ABDOMEN AND PELVIS FINDINGS

Arterial findings:

Aorta: The supraceliac segment is unremarkable. There is a focal
saccular aneurysm at the level of the SMA origin oriented to the
left of midline, 3.2 cm maximum transverse diameter, stable since
07/15/2010. There is a broad diverticulum involving the origin of
the left renal artery, measured 19 mm diameter, resulting in a
saccular aneurysmal segment of the aorta measuring 3.6 cm maximum
transverse diameter as before. There is a bifurcated aorto bi-iliac
graft which is patent.

Celiac axis: Patent, unremarkable unremarkable distal branching
anatomy.

Superior mesenteric: Patent, classic distal branch anatomy.

Left renal: Single, patent, with aneurysmal dilatation at its origin
as above.

Right renal:         Single, patent

Inferior mesenteric: Patent, arising from the graft with mild origin
stenosis.

Left iliac:          The left internal iliac artery is occluded.

Right iliac: There is a fusiform aneurysm of the right internal
iliac artery, 2.2 cm diameter (previously 2 cm), with some
nonocclusive eccentric mural thrombus. There is mild fusiform
dilatation of the right common femoral artery to 17 mm diameter with
patchy calcified wall plaque, no stenosis, stable since prior exam.

Venous findings:     Dedicated venous phase imaging not obtained.

Review of the MIP images confirms the above findings.

Nonvascular findings: Unremarkable arterial phase evaluation of
liver, spleen, adrenal glands, pancreas. Small bilateral renal
cysts, largest on the right in the lower pole 2.1 cm diameter. No
hydronephrosis. Innumerable small partially calcified stones
layering in the dependent aspect of the gallbladder. Stomach, small
bowel, and colon are nondilated. Small bowel anastomotic staple
lines in the right lower quadrant. Normal appendix. Urinary bladder
physiologically distended. There is moderate prostatic enlargement
with central coarse calcification. A few scattered sigmoid
diverticula without adjacent inflammatory/ edematous change. No
ascites. No free air. No adenopathy. Degenerative disc disease noted
L1-2, L2-3, L4-5, L5-S1.

:
IMPRESSION: 1. Negative for dissection or other acute abnormality.
2. Stable thoracic aortic ductus diverticulum.
3. Small saccular aortic aneurysms near the SMA and left renal
artery origins respectively, stable since 07/15/2010, and minimally
increased since scans dating back to 03/18/2004.
4. 33mmright internal iliac artery aneurysm, slightly increased
since prior study.
5. Cholelithiasis

## 2015-11-13 ENCOUNTER — Encounter: Payer: Self-pay | Admitting: Internal Medicine

## 2015-11-13 ENCOUNTER — Ambulatory Visit (INDEPENDENT_AMBULATORY_CARE_PROVIDER_SITE_OTHER): Payer: Medicare Other | Admitting: Internal Medicine

## 2015-11-13 VITALS — BP 146/80 | HR 74 | Ht 75.0 in | Wt 248.0 lb

## 2015-11-13 DIAGNOSIS — J449 Chronic obstructive pulmonary disease, unspecified: Secondary | ICD-10-CM | POA: Diagnosis not present

## 2015-11-13 DIAGNOSIS — Z9484 Stem cells transplant status: Secondary | ICD-10-CM

## 2015-11-13 DIAGNOSIS — J9611 Chronic respiratory failure with hypoxia: Secondary | ICD-10-CM

## 2015-11-13 NOTE — Progress Notes (Signed)
Subjective:     Patient ID: [name], male   DOB: 1941-03-31, 75 y.o.   MRN: [id number]  HPI  OV 11/13/2015  Chief Complaint  Patient presents with  . Follow-up    Pt states his breathing has improved since last OV. Pt states he received a stem cell transplant in late March with [facility]. Pt denies cough and CP/tightness.      Follow-up Gold stage III with chronic respiratory failure [does not have interstitial lung disease on 2015 CT chest] in a patient with . Known rheumatoid arthritis and diabetes. Ex-smoker  - COPD: Last visit Jan 2016 and may 2016: Last seen October 2016. Since then no exacerbations. 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 Since then he says he's feeling better. COPD cat score is 14. He says he is able to exercise at the gym with 2 L oxygen instead of the usual 5-6 liters. He wants to have a pulmonary function test to see if his lung function is improved compared to thousand 15. He will go for one more round of stem cell transplant in 2 months time. His wife also underwent stem cell transplant but has not felt that it has helped her. However for him it is subjectively helped. COPD cat score is 14. I do not have priors for comparison.   CAT COPD Symptom & Quality of Life Score (GSK trademark) 0 is no burden. 5 is highest burden 11/13/2015   Never Cough -> Cough all the time 1  No phlegm in chest -> Chest is full of phlegm 1  No chest tightness -> Chest feels very tight 1  No dyspnea for 1 flight stairs/hill -> Very dyspneic for 1 flight of stairs 3  No limitations for ADL at home -> Very limited with ADL at home 3  Confident leaving home -> Not at all confident leaving home 2  Sleep soundly -> Do not sleep soundly because of lung condition 1  Lots of Energy -> No energy at all 2  TOTAL Score (max 40)  14        has a past medical history of THRUSH (11/06/2009);  HYPOTHYROIDISM (07/30/2009); DIABETES MELLITUS, TYPE II (11/23/2009); DEPRESSION (11/23/2009); DECREASED HEARING, LEFT EAR (03/01/2010); HYPERTENSION (07/30/2009); CORONARY ARTERY DISEASE (11/23/2009); CHRONIC OBSTRUCTIVE PULMONARY DISEASE, ACUTE EXACERBATION (03/01/2010); C O P D (07/30/2009); PULMONARY FIBROSIS (06/18/2010); RESPIRATORY FAILURE, CHRONIC (07/31/2009); BENIGN PROSTATIC HYPERTROPHY (11/23/2009); DEGENERATIVE JOINT DISEASE (11/23/2009); LUMBAR RADICULOPATHY, RIGHT (06/05/2010); FATIGUE (11/23/2009); TREMOR (11/23/2009); GAIT DISTURBANCE (12/10/2009); DYSPNEA/SHORTNESS OF BREATH (12/08/2009); HEMOPTYSIS UNSPECIFIED (05/07/2010); RA (rheumatoid arthritis) (HCC) (06/11/2011); PTSD (post-traumatic stress disorder) (03/10/2012); and Scleritis of both eyes (03/17/2014).   reports that he quit smoking about 17 years ago. His smoking use included Cigarettes, Pipe, and Cigars. He has a 100 pack-year smoking history. He has never used smokeless tobacco.  Past Surgical History  Procedure Laterality Date  . Small bowel obstruction repair with adhesiolysis    . Hx of remote ileum resection due to bleeding    . Abdominal aortic aneurysm repair    . S/p back surgury   1972    lumbar laminectomy Dr. Fannie Knee  . S/p left femoral embolectomy with left leg ischemia      Dr. Hart Rochester, vascular  . S/p right ganglion cyst      Dr. Teressa Senter    No Known Allergies  Immunization History  Administered Date(s) Administered  . Influenza Split 04/14/2011, 04/27/2013  .  Influenza Whole 05/28/2009, 04/27/2010, 03/31/2012  . Influenza, High Dose Seasonal PF 03/21/2015  . Influenza,inj,Quad PF,36+ Mos 03/17/2014  . Pneumococcal Conjugate-13 09/20/2013  . Pneumococcal Polysaccharide-23 05/28/2008  . Pneumococcal-Unspecified 04/27/2013  . Td 11/23/2009  . Zoster 04/28/2009    Family History  Problem Relation Age of Onset  . Other Mother     gun shot     Current outpatient prescriptions:  .  albuterol (PROAIR HFA) 108 (90 BASE)  MCG/ACT inhaler, Inhale 2 puffs into the lungs every 4 (four) hours as needed. Shortness of breath, Disp: 1 Inhaler, Rfl: 6 .  albuterol (PROVENTIL) (2.5 MG/3ML) 0.083% nebulizer solution, 1 vial in nebulizer every 6 hours as needed Dx 496, Disp: 120 mL, Rfl: 6 .  ALPRAZolam (XANAX) 0.5 MG tablet, Take 0.5 mg by mouth at bedtime. For sleep and anxiety, Disp: , Rfl:  .  atorvastatin (LIPITOR) 40 MG tablet, Take 10 mg by mouth daily., Disp: , Rfl:  .  fluticasone (FLONASE) 50 MCG/ACT nasal spray, Place 2 sprays into both nostrils daily. (Patient taking differently: Place 2 sprays into both nostrils daily as needed. ), Disp: 16 g, Rfl: 2 .  gabapentin (NEURONTIN) 800 MG tablet, Take 800 mg by mouth 2 (two) times daily.  , Disp: , Rfl:  .  hydroxychloroquine (PLAQUENIL) 200 MG tablet, Take 200 mg by mouth daily., Disp: , Rfl:  .  insulin glargine (LANTUS) 100 UNIT/ML injection, Inject 70 Units into the skin at bedtime., Disp: , Rfl:  .  Levothyroxine Sodium (SYNTHROID PO), Take 0.2 mg by mouth every morning., Disp: , Rfl:  .  losartan (COZAAR) 50 MG tablet, Take 1 tablet (50 mg total) by mouth daily., Disp: 30 tablet, Rfl: 0 .  metFORMIN (GLUCOPHAGE) 1000 MG tablet, Take 1,000 mg by mouth 2 (two) times daily with a meal., Disp: , Rfl:  .  mometasone (ASMANEX 60 METERED DOSES) 220 MCG/INH inhaler, Inhale 2 puffs into the lungs daily.  , Disp: , Rfl:  .  Multiple Vitamins-Minerals (MEGA MULTIVITAMIN FOR MEN PO), Take by mouth daily.  , Disp: , Rfl:  .  Olodaterol HCl (STRIVERDI RESPIMAT) 2.5 MCG/ACT AERS, Inhale 2.5 Inhalers into the lungs 1 day or 1 dose. 2 inhalations once a day, Disp: , Rfl:  .  saxagliptin HCl (ONGLYZA) 5 MG TABS tablet, Take 5 mg by mouth daily., Disp: , Rfl:  .  sertraline (ZOLOFT) 100 MG tablet, 100 mg. Take 1/2 by mouth daily as needed, Disp: , Rfl:  .  Tamsulosin HCl (FLOMAX) 0.4 MG CAPS, Take by mouth at bedtime.  , Disp: , Rfl:  .  tiotropium (SPIRIVA HANDIHALER) 18 MCG  inhalation capsule, Place 18 mcg into inhaler and inhale daily.  , Disp: , Rfl:      Review of Systems     Objective:   Physical Exam  Constitutional: He is oriented to person, place, and time. He appears well-developed and well-nourished. No distress.  HENT:  Head: Normocephalic and atraumatic.  Right Ear: External ear normal.  Left Ear: External ear normal.  Mouth/Throat: Oropharynx is clear and moist. No oropharyngeal exudate.  Oxygen on Purse lip breathing present  Eyes: Conjunctivae and EOM are normal. Pupils are equal, round, and reactive to light. Right eye exhibits no discharge. Left eye exhibits no discharge. No scleral icterus.  Neck: Normal range of motion. Neck supple. No JVD present. No tracheal deviation present. No thyromegaly present.  Cardiovascular: Normal rate, regular rhythm and intact distal pulses.  Exam reveals no  gallop and no friction rub.   No murmur heard. Pulmonary/Chest: Effort normal and breath sounds normal. No respiratory distress. He has no wheezes. He has no rales. He exhibits no tenderness.  Barrel chest without any wheeze or crackles  Abdominal: Soft. Bowel sounds are normal. He exhibits no distension and no mass. There is no tenderness. There is no rebound and no guarding.  Significant visible obesity present  Musculoskeletal: Normal range of motion. He exhibits no edema or tenderness.  Lymphadenopathy:    He has no cervical adenopathy.  Neurological: He is alert and oriented to person, place, and time. He has normal reflexes. No cranial nerve deficit. Coordination normal.  Skin: Skin is warm and dry. No rash noted. He is not diaphoretic. No erythema. No pallor.  Psychiatric: He has a normal mood and affect. His behavior is normal. Judgment and thought content normal.  Nursing note and vitals reviewed.  Filed Vitals:   11/13/15 1542  BP: 146/80  Pulse: 74  Height: 6\' 3"  (1.905 m)  Weight: 248 lb (112.492 kg)  SpO2: 95%   Estimated body  mass index is 31 kg/(m^2) as calculated from the following:   Height as of this encounter: 6\' 3"  (1.905 m).   Weight as of this encounter: 248 lb (112.492 kg).      Assessment:       ICD-9-CM ICD-10-CM   1. Chronic respiratory failure with hypoxia (HCC) 518.83 J96.11    799.02    2. 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 H/O stem cell transplant [facility]) V42.82 Z94.84        Plan:      Glad you feel better after stem cell transplant  continue oxygen and inhalers and exercise program as before Please do full pulmonary function test at your convenience  - Once you have done this please let [facility] know so we can get the results faxed over to [facility]  Follow-up  - 3-6 months or sooner if needed       Dr. [place], M.D., [facility].C.P Pulmonary and Critical Care Medicine Staff Physician [facility] [place] [facility] Pager: [phone], If no answer or between  15:00h - 7:00h: call [phone]  11/13/2015 4:14 PM

## 2015-11-13 NOTE — Patient Instructions (Addendum)
ICD-9-CM ICD-10-CM   1. Chronic respiratory failure with hypoxia (HCC) 518.83 J96.11    799.02    2. 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 H/O stem cell transplant [facility]) V42.82 Z94.84     Glad you feel better after stem cell transplant  continue oxygen and inhalers and exercise program as before Please do full pulmonary function test at your convenience  - Once you have done this please let us know so we can get the results faxed over to [facility]  Follow-up  - 3-6 months or sooner if needed

## 2015-11-16 ENCOUNTER — Ambulatory Visit (INDEPENDENT_AMBULATORY_CARE_PROVIDER_SITE_OTHER): Payer: Medicare Other | Admitting: Internal Medicine

## 2015-11-16 ENCOUNTER — Telehealth: Payer: Self-pay | Admitting: Internal Medicine

## 2015-11-16 DIAGNOSIS — J449 Chronic obstructive pulmonary disease, unspecified: Secondary | ICD-10-CM

## 2015-11-16 LAB — PULMONARY FUNCTION TEST
DL/VA % pred: 40 %
DL/VA: 1.97 ml/min/mmHg/L
DLCO COR % PRED: 26 %
DLCO UNC: 9.88 ml/min/mmHg
DLCO cor: 9.94 ml/min/mmHg
DLCO unc % pred: 26 %
FEF 25-75 POST: 0.5 L/s
FEF 25-75 Pre: 0.48 L/sec
FEF2575-%Change-Post: 4 %
FEF2575-%Pred-Post: 18 %
FEF2575-%Pred-Pre: 17 %
FEV1-%CHANGE-POST: 3 %
FEV1-%PRED-POST: 36 %
FEV1-%PRED-PRE: 35 %
FEV1-POST: 1.33 L
FEV1-Pre: 1.28 L
FEV1FVC-%Change-Post: 6 %
FEV1FVC-%Pred-Pre: 59 %
FEV6-%Change-Post: 0 %
FEV6-%PRED-PRE: 59 %
FEV6-%Pred-Post: 59 %
FEV6-POST: 2.76 L
FEV6-Pre: 2.78 L
FEV6FVC-%Change-Post: 2 %
FEV6FVC-%PRED-POST: 101 %
FEV6FVC-%Pred-Pre: 99 %
FVC-%CHANGE-POST: -2 %
FVC-%PRED-PRE: 59 %
FVC-%Pred-Post: 58 %
FVC-POST: 2.89 L
FVC-PRE: 2.96 L
POST FEV6/FVC RATIO: 96 %
PRE FEV6/FVC RATIO: 94 %
Post FEV1/FVC ratio: 46 %
Pre FEV1/FVC ratio: 43 %

## 2015-11-16 NOTE — Progress Notes (Signed)
PFT done today. 

## 2015-11-16 NOTE — Telephone Encounter (Signed)
Let [name] know that PFT is very similar to 1 year ago. He wants copy - he can come and pick it up  - you can give this and last years too  Dr. Kalman Shan, M.D., [facility].C.P Pulmonary and Critical Care Medicine Staff Physician [facility] [place] [facility] Pager: [phone], If no answer or between  15:00h - 7:00h: call [phone]  11/16/2015 5:33 PM

## 2015-11-19 NOTE — Telephone Encounter (Signed)
Called and spoke to pt. Informed him of the results per MR. Pt verbalized understanding and states he would like the results faxed to the [facility] at [phone]. Records faxed per pt's request. Nothing further needed at this time.

## 2015-11-30 ENCOUNTER — Ambulatory Visit: Payer: Medicare Other | Admitting: Internal Medicine

## 2015-12-19 ENCOUNTER — Other Ambulatory Visit: Payer: Self-pay | Admitting: Sports Medicine

## 2015-12-19 DIAGNOSIS — M545 Low back pain: Secondary | ICD-10-CM

## 2015-12-29 ENCOUNTER — Ambulatory Visit
Admission: RE | Admit: 2015-12-29 | Discharge: 2015-12-29 | Disposition: A | Discharge status: Home / Self Care | Payer: Medicare Other | Source: Ambulatory Visit | Attending: Sports Medicine | Admitting: Sports Medicine

## 2015-12-29 DIAGNOSIS — M545 Low back pain: Secondary | ICD-10-CM

## 2016-01-28 ENCOUNTER — Ambulatory Visit: Payer: Medicare Other | Admitting: Internal Medicine

## 2016-03-03 ENCOUNTER — Encounter: Payer: Self-pay | Admitting: Adult Health

## 2016-03-03 ENCOUNTER — Ambulatory Visit (INDEPENDENT_AMBULATORY_CARE_PROVIDER_SITE_OTHER): Payer: Medicare Other | Admitting: Adult Health

## 2016-03-03 ENCOUNTER — Ambulatory Visit (INDEPENDENT_AMBULATORY_CARE_PROVIDER_SITE_OTHER)
Admission: RE | Admit: 2016-03-03 | Discharge: 2016-03-03 | Disposition: A | Discharge status: Home / Self Care | Payer: Medicare Other | Source: Ambulatory Visit | Attending: Adult Health | Admitting: Adult Health

## 2016-03-03 VITALS — BP 142/72 | HR 77 | Temp 98.6°F | Ht 75.0 in | Wt 240.0 lb

## 2016-03-03 DIAGNOSIS — J441 Chronic obstructive pulmonary disease with (acute) exacerbation: Secondary | ICD-10-CM | POA: Diagnosis not present

## 2016-03-03 DIAGNOSIS — R042 Hemoptysis: Secondary | ICD-10-CM | POA: Diagnosis not present

## 2016-03-03 MED ORDER — PREDNISONE 10 MG PO TABS: ORAL_TABLET | ORAL | 0 refills | Status: DC

## 2016-03-03 MED ORDER — AMOXICILLIN-POT CLAVULANATE 875-125 MG PO TABS: 1.0000 | ORAL_TABLET | Freq: Two times a day (BID) | ORAL | 0 refills | Status: AC

## 2016-03-03 NOTE — Assessment & Plan Note (Signed)
Exacerbation  cxr today w/ chronic changes   Plan  Augmentin 875mg  Twice daily  For 1 week. -take with food.  Mucinex DM Twice daily  As needed  Cough/congestion  Prednisone taper over next week.  Follow up Dr. in 3 months and As needed   Please contact office for sooner follow up if symptoms do not improve or worsen or seek emergency care

## 2016-03-03 NOTE — Patient Instructions (Addendum)
Augmentin 875mg  Twice daily  For 1 week. -take with food.  Mucinex DM Twice daily  As needed  Cough/congestion  Prednisone taper over next week.  Follow up Dr. in 3 months and As needed   Please contact office for sooner follow up if symptoms do not improve or worsen or seek emergency care

## 2016-03-03 NOTE — Progress Notes (Signed)
Subjective:    Patient ID: [name], male    DOB: June 13, 1941, 75 y.o.   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  PFT 10/2015 FEV1 was 36%, ratio 46, FVC 58%, DLCO 26%  03/03/2016 Acute OV : COPD  Patient presents for an acute office visit. He complains of chills, weakness, body aches, productive cough with thick yellow bloody mucus that started 2 days ago Mucus is mixed with blood.  Been having increased shortness breath with activity. Denies any wheezing, chest tightness, sinus pressure/drainage, nausea or vomiting, calf pain .  CXR today shows chronic changes .  He remains on Asmanex and Spiriva. Pneumovax and Prevnar 13 vaccine is up-to-date CT chest in March showed no suspcious lung nodules. Changes of asbestos with calfified pleural plaques. , Emphysema changes.    Past Medical History:  Diagnosis Date  . BENIGN PROSTATIC HYPERTROPHY 11/23/2009  . C O P D 07/30/2009  . CHRONIC OBSTRUCTIVE PULMONARY DISEASE, ACUTE EXACERBATION 03/01/2010  . CORONARY ARTERY DISEASE 11/23/2009  . 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 DEGENERATIVE JOINT DISEASE 11/23/2009  . DEPRESSION 11/23/2009  . DIABETES MELLITUS, TYPE II 11/23/2009  . DYSPNEA/SHORTNESS OF BREATH 12/08/2009  . FATIGUE 11/23/2009  . GAIT DISTURBANCE 12/10/2009  . HEMOPTYSIS UNSPECIFIED 05/07/2010  . HYPERTENSION 07/30/2009  . HYPOTHYROIDISM 07/30/2009  . 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 PTSD (post-traumatic stress disorder) 03/10/2012  . 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 RA (rheumatoid arthritis) (HCC) 06/11/2011  . RESPIRATORY FAILURE, CHRONIC 07/31/2009  . Scleritis of both eyes 03/17/2014  . THRUSH 11/06/2009  . 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   Current Outpatient Prescriptions on  File Prior to Visit  Medication Sig Dispense Refill  . albuterol (PROAIR HFA) 108 (90 BASE) MCG/ACT inhaler Inhale 2 puffs into the lungs every 4 (four) hours as needed. Shortness of breath 1 Inhaler 6  . albuterol (PROVENTIL) (2.5 MG/3ML) 0.083% nebulizer solution 1 vial in nebulizer every 6 hours as needed Dx 496 120 mL 6  . ALPRAZolam (XANAX) 0.5 MG tablet Take 0.5 mg by mouth at bedtime. For sleep and anxiety    . atorvastatin (LIPITOR) 40 MG tablet Take 10 mg by mouth daily.    . fluticasone (FLONASE) 50 MCG/ACT nasal spray Place 2 sprays into both nostrils daily. (Patient taking differently: Place 2 sprays into both nostrils daily as needed. ) 16 g 2  . gabapentin (NEURONTIN) 800 MG tablet Take 800 mg by mouth 2 (two) times daily.      . hydroxychloroquine (PLAQUENIL) 200 MG tablet Take 200 mg by mouth daily.    . insulin glargine (LANTUS) 100 UNIT/ML injection Inject 70 Units into the skin at bedtime.    . Levothyroxine Sodium (SYNTHROID PO) Take 0.2 mg by mouth every morning.    [name] losartan (COZAAR) 50 MG tablet Take 1 tablet (50 mg total) by mouth daily. 30 tablet 0  . metFORMIN (GLUCOPHAGE) 1000 MG tablet Take 1,000 mg by mouth 2 (two) times daily with a meal.    . mometasone (ASMANEX 60 METERED DOSES) 220 MCG/INH inhaler Inhale 2 puffs into the lungs daily.      . Multiple Vitamins-Minerals (MEGA MULTIVITAMIN FOR MEN PO) Take by mouth daily.      . Olodaterol HCl (STRIVERDI RESPIMAT) 2.5 MCG/ACT  AERS Inhale 2.5 Inhalers into the lungs 1 day or 1 dose. 2 inhalations once a day    . sertraline (ZOLOFT) 100 MG tablet 100 mg. Take 1/2 by mouth daily as needed    . Tamsulosin HCl (FLOMAX) 0.4 MG CAPS Take by mouth at bedtime.      [name] tiotropium (SPIRIVA HANDIHALER) 18 MCG inhalation capsule Place 18 mcg into inhaler and inhale daily.       No current facility-administered medications on file prior to visit.     Review of Systems Constitutional:   No  weight loss, night sweats,  +Fevers,  chills, fatigue, or  lassitude.  HEENT:   No headaches,  Difficulty swallowing,  Tooth/dental problems, or  Sore throat,                No sneezing, itching, ear ache,  +nasal congestion, post nasal drip,   CV:  No chest pain,  Orthopnea, PND, swelling in lower extremities, anasarca, dizziness, palpitations, syncope.   GI  No heartburn, indigestion, abdominal pain, nausea, vomiting, diarrhea, change in bowel habits, loss of appetite, bloody stools.   Resp:    No chest wall deformity  Skin: no rash or lesions.  GU: no dysuria, change in color of urine, no urgency or frequency.  No flank pain, no hematuria   MS:  No joint pain or swelling.  No decreased range of motion.  No back pain.  Psych:  No change in mood or affect. No depression or anxiety.  No memory loss.         Objective:   Physical Exam  Vitals:   03/03/16 0932  BP: (!) 142/72  Pulse: 77  Temp: 98.6 F (37 C)  TempSrc: Oral  SpO2: 90%  Weight: 240 lb (108.9 kg)  Height: 6\' 3"  (1.905 m)   GEN: A/Ox3; pleasant , NAD, elderly    HEENT:  [place]/AT,  EACs-clear, TMs-wnl, NOSE-clear drainage , THROAT-clear, no lesions, no postnasal drip or exudate noted.   NECK:  Supple w/ fair ROM; no JVD; normal carotid impulses w/o bruits; no thyromegaly or nodules palpated; no lymphadenopathy.    RESP  decrased BS in bases ,  no accessory muscle use, no dullness to percussion  CARD:  RRR, no m/r/g  , no peripheral edema, pulses intact, no cyanosis or clubbing.  GI:   Soft & nt; nml bowel sounds; no organomegaly or masses detected.   Musco: Warm bil, no deformities or joint swelling noted.   Neuro: alert, no focal deficits noted.    Skin: Warm, no lesions or rashes  Zakari Bathe NP-C  [facility]  03/03/2016

## 2016-03-13 ENCOUNTER — Ambulatory Visit: Payer: Medicare Other | Admitting: Internal Medicine

## 2016-04-04 ENCOUNTER — Encounter: Payer: Self-pay | Admitting: Internal Medicine

## 2016-04-04 ENCOUNTER — Ambulatory Visit (INDEPENDENT_AMBULATORY_CARE_PROVIDER_SITE_OTHER): Payer: Medicare Other | Admitting: Internal Medicine

## 2016-04-04 ENCOUNTER — Other Ambulatory Visit (INDEPENDENT_AMBULATORY_CARE_PROVIDER_SITE_OTHER): Payer: Medicare Other

## 2016-04-04 VITALS — BP 138/78 | HR 78 | Temp 98.5°F | Resp 20 | Wt 236.0 lb

## 2016-04-04 DIAGNOSIS — E119 Type 2 diabetes mellitus without complications: Secondary | ICD-10-CM | POA: Diagnosis not present

## 2016-04-04 DIAGNOSIS — Z23 Encounter for immunization: Secondary | ICD-10-CM | POA: Diagnosis not present

## 2016-04-04 DIAGNOSIS — M2550 Pain in unspecified joint: Secondary | ICD-10-CM

## 2016-04-04 DIAGNOSIS — M069 Rheumatoid arthritis, unspecified: Secondary | ICD-10-CM | POA: Diagnosis not present

## 2016-04-04 DIAGNOSIS — Z0001 Encounter for general adult medical examination with abnormal findings: Secondary | ICD-10-CM

## 2016-04-04 DIAGNOSIS — R6889 Other general symptoms and signs: Secondary | ICD-10-CM

## 2016-04-04 DIAGNOSIS — F329 Major depressive disorder, single episode, unspecified: Secondary | ICD-10-CM

## 2016-04-04 DIAGNOSIS — F32A Depression, unspecified: Secondary | ICD-10-CM

## 2016-04-04 LAB — HEPATIC FUNCTION PANEL
ALT: 15 U/L (ref 0–53)
AST: 17 U/L (ref 0–37)
Albumin: 4.3 g/dL (ref 3.5–5.2)
Alkaline Phosphatase: 66 U/L (ref 39–117)
BILIRUBIN TOTAL: 0.5 mg/dL (ref 0.2–1.2)
Bilirubin, Direct: 0.2 mg/dL (ref 0.0–0.3)
TOTAL PROTEIN: 7 g/dL (ref 6.0–8.3)

## 2016-04-04 LAB — LIPID PANEL
CHOL/HDL RATIO: 5
Cholesterol: 145 mg/dL (ref 0–200)
HDL: 30.2 mg/dL — AB (ref >39.00)
LDL CALC: 80 mg/dL (ref 0–99)
NONHDL: 114.37
TRIGLYCERIDES: 173 mg/dL — AB (ref 0.0–149.0)
VLDL: 34.6 mg/dL (ref 0.0–40.0)

## 2016-04-04 LAB — BASIC METABOLIC PANEL
BUN: 14 mg/dL (ref 6–23)
CALCIUM: 9.2 mg/dL (ref 8.4–10.5)
CO2: 31 mEq/L (ref 19–32)
CREATININE: 0.96 mg/dL (ref 0.40–1.50)
Chloride: 101 mEq/L (ref 96–112)
GFR: 81.25 mL/min (ref >60.00)
Glucose, Bld: 131 mg/dL — ABNORMAL HIGH (ref 70–99)
Potassium: 4.7 mEq/L (ref 3.5–5.1)
Sodium: 139 mEq/L (ref 135–145)

## 2016-04-04 LAB — C-REACTIVE PROTEIN: CRP: 1.6 mg/dL (ref 0.5–20.0)

## 2016-04-04 LAB — SEDIMENTATION RATE: SED RATE: 14 mm/h (ref 0–20)

## 2016-04-04 LAB — HEMOGLOBIN A1C: Hgb A1c MFr Bld: 8.1 % — ABNORMAL HIGH (ref 4.6–6.5)

## 2016-04-04 MED ORDER — PREDNISONE 10 MG PO TABS: ORAL_TABLET | ORAL | 0 refills | Status: DC

## 2016-04-04 MED ORDER — METHYLPREDNISOLONE ACETATE 80 MG/ML IJ SUSP
80.0000 mg | Freq: Once | INTRAMUSCULAR | Status: AC
  Administered 2016-04-04: 80 mg via INTRAMUSCULAR

## 2016-04-04 MED ORDER — HYDROCODONE-ACETAMINOPHEN 7.5-325 MG PO TABS: 1.0000 | ORAL_TABLET | Freq: Four times a day (QID) | ORAL | 0 refills | Status: DC | PRN

## 2016-04-04 NOTE — Progress Notes (Signed)
Pre visit review using our clinic review tool, if applicable. No additional management support is needed unless otherwise documented below in the visit note. 

## 2016-04-04 NOTE — Progress Notes (Signed)
Subjective:    Patient ID: [name], male    DOB: 10-05-1940, 75 y.o.   MRN: [id number]  HPI  Here to f/u; overall doing ok,  Pt denies chest pain, increasing sob or doe, wheezing, orthopnea, PND, increased LE swelling, palpitations, dizziness or syncope.  Pt denies new neurological symptoms such as new headache, or facial or extremity weakness or numbness.  Pt denies polydipsia, polyuria, or low sugar episode.   Pt denies new neurological symptoms such as new headache, or facial or extremity weakness or numbness.   Pt states overall good compliance with meds, has lost wt intentioinally per pt, with goal 220 Wt Readings from Last 3 Encounters:  04/04/16 236 lb (107 kg)  03/03/16 240 lb (108.9 kg)  11/13/15 248 lb (112.5 kg)  Cologuard neg last year. Did see podiatry x 3 times with a lesion removal plantar.    Has not seen rheum > 10 yrs after MD retired, but now with worsening right shoulder pain s/p coritsone x 2 per [facility] orth[facility], now also with 1/4 NTG patch for persistent pain related to rot cuff tearing. Has ongoing arthritic pain to right shoulder, left shoulder, lumbar and knees; has hx of RA , taken enbrel in past and has been fortunate no major flares in > 10 yrs.Has been seeing rheum irreg about every 6 mo at [place] but does not think adequate tx.  Tramadol helps very little for current pain.  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  Has had mild worsening depressive symptoms he thinks related to pain, difficult to sleep in pain, but nosuicidal ideation, or panic.  Also asks for PT referral at [facility]  - [facility] outpt rehab since lives near there.  Has worsening balance it seems, some due to worsening ortho issues but difficult to do excercises per ortho for shoulder due to severity of pain.   Walking with cane today, does not normally do this Past Medical History:  Diagnosis Date  . BENIGN PROSTATIC HYPERTROPHY  11/23/2009  . C O P D 07/30/2009  . CHRONIC OBSTRUCTIVE PULMONARY DISEASE, ACUTE EXACERBATION 03/01/2010  . CORONARY ARTERY DISEASE 11/23/2009  . 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 DEGENERATIVE JOINT DISEASE 11/23/2009  . DEPRESSION 11/23/2009  . DIABETES MELLITUS, TYPE II 11/23/2009  . DYSPNEA/SHORTNESS OF BREATH 12/08/2009  . FATIGUE 11/23/2009  . GAIT DISTURBANCE 12/10/2009  . HEMOPTYSIS UNSPECIFIED 05/07/2010  . HYPERTENSION 07/30/2009  . HYPOTHYROIDISM 07/30/2009  . 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 PTSD (post-traumatic stress disorder) 03/10/2012  . 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 RA (rheumatoid arthritis) (HCC) 06/11/2011  . RESPIRATORY FAILURE, CHRONIC 07/31/2009  . Scleritis of both eyes 03/17/2014  . THRUSH 11/06/2009  . 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   Past Surgical History:  Procedure Laterality Date  . ABDOMINAL AORTIC ANEURYSM REPAIR    . hx of remote ileum resection due to bleeding    . s/p back surgury   1972   lumbar laminectomy Dr. Fannie Knee  . s/p left femoral embolectomy with left leg ischemia     Dr. Hart [place], vascular  . s/p right ganglion cyst     Dr. Teressa Senter  . small bowel obstruction repair with adhesiolysis      reports that he quit smoking about 17 years ago. His smoking use included Cigarettes, Pipe, and Cigars. He has a 100.00 pack-year smoking history. He has never used smokeless tobacco. He reports that he does not  drink alcohol or use drugs. family history includes Other in his mother. No Known Allergies Current Outpatient Prescriptions on File Prior to Visit  Medication Sig Dispense Refill  . albuterol (PROAIR HFA) 108 (90 BASE) MCG/ACT inhaler Inhale 2 puffs into the lungs every 4 (four) hours as needed. Shortness of breath 1 Inhaler 6  . albuterol (PROVENTIL) (2.5 MG/3ML) 0.083% nebulizer solution 1 vial in nebulizer every 6 hours as needed Dx 496 120 mL 6  . ALPRAZolam (XANAX) 0.5 MG tablet Take 0.5 mg by mouth at bedtime. For sleep and anxiety    . atorvastatin  (LIPITOR) 40 MG tablet Take 10 mg by mouth daily.    . fluticasone (FLONASE) 50 MCG/ACT nasal spray Place 2 sprays into both nostrils daily. (Patient taking differently: Place 2 sprays into both nostrils daily as needed. ) 16 g 2  . gabapentin (NEURONTIN) 800 MG tablet Take 800 mg by mouth 2 (two) times daily.      . hydroxychloroquine (PLAQUENIL) 200 MG tablet Take 200 mg by mouth daily.    . insulin glargine (LANTUS) 100 UNIT/ML injection Inject 70 Units into the skin at bedtime.    . Levothyroxine Sodium (SYNTHROID PO) Take 0.2 mg by mouth every morning.    . Liraglutide (VICTOZA) 18 MG/3ML SOPN 1.2 daily    . losartan (COZAAR) 50 MG tablet Take 1 tablet (50 mg total) by mouth daily. 30 tablet 0  . metFORMIN (GLUCOPHAGE) 1000 MG tablet Take 1,000 mg by mouth 2 (two) times daily with a meal.    . mometasone (ASMANEX 60 METERED DOSES) 220 MCG/INH inhaler Inhale 2 puffs into the lungs daily.      . Multiple Vitamins-Minerals (MEGA MULTIVITAMIN FOR MEN PO) Take by mouth daily.      . Olodaterol HCl (STRIVERDI RESPIMAT) 2.5 MCG/ACT AERS Inhale 2.5 Inhalers into the lungs 1 day or 1 dose. 2 inhalations once a day    . sertraline (ZOLOFT) 100 MG tablet 100 mg. Take 1/2 by mouth daily as needed    . Tamsulosin HCl (FLOMAX) 0.4 MG CAPS Take by mouth at bedtime.      [name] tiotropium (SPIRIVA HANDIHALER) 18 MCG inhalation capsule Place 18 mcg into inhaler and inhale daily.       No current facility-administered medications on file prior to visit.    Review of Systems  Constitutional: Negative for unusual diaphoresis or night sweats HENT: Negative for ear swelling or discharge Eyes: Negative for worsening visual haziness  Respiratory: Negative for choking and stridor.   Gastrointestinal: Negative for distension or worsening eructation Genitourinary: Negative for retention or change in urine volume.  Musculoskeletal: Negative for other MSK pain or swelling Skin: Negative for color change and worsening  wound Neurological: Negative for tremors and numbness other than noted  Psychiatric/Behavioral: Negative for decreased concentration or agitation other than above       Objective:   Physical Exam BP 138/78   Pulse 78   Temp 98.5 F (36.9 C) (Oral)   Resp 20   Wt 236 lb (107 kg)   SpO2 95%   BMI 29.50 kg/m  On home o2 2L VS noted, on o2 Constitutional: Pt appears in no apparent distress HENT: Head: NCAT.  Right Ear: External ear normal.  Left Ear: External ear normal.  Eyes: . Pupils are equal, round, and reactive to light. Conjunctivae and EOM are normal Neck: Normal range of motion. Neck supple.  Cardiovascular: Normal rate and regular rhythm.   Pulmonary/Chest: Effort normal and  breath sounds decreased without rales or wheezing.  Spine: diffuse lumbar tender Bilat knees with trace effusion, decreased ROM, crepitus Neurological: Pt is alert. Not confused , motor grossly intact Skin: Skin is warm. No rash, no LE edema Psychiatric: Pt behavior is normal. No agitation.     Assessment & Plan:

## 2016-04-04 NOTE — Patient Instructions (Addendum)
You had the flu shot today  You had the steroid shot today  Please take all new medication as prescribed - the pain medication, and prednisone  Please continue all other medications as before, and refills have been done if requested.  Please have the pharmacy call with any other refills you may need.  Please keep your appointments with your specialists as you may have planned  You will be contacted regarding the referral for: Physical Therapy, and Rheumatology  Please go to the LAB in the Basement (turn left off the elevator) for the tests to be done today  You will be contacted by phone if any changes need to be made immediately.  Otherwise, you will receive a letter about your results with an explanation, but please check with MyChart first.  Please remember to sign up for MyChart if you have not done so, as this will be important to you in the future with finding out test results, communicating by private email, and scheduling acute appointments online when needed.  Please return in 6 months, or sooner if needed, with Lab testing done 3-5 days before

## 2016-04-05 NOTE — Assessment & Plan Note (Addendum)
Possible RA vs OA flare mult joints, for predpac asd, depomedrol IM, pain control, refer for PT outpt eval and tx  Note:  Total time for pt hx, exam, review of record with pt in the room, determination of diagnoses and plan for further eval and tx is > 40 min, with over 50% spent in coordination and counseling of patient

## 2016-04-05 NOTE — Assessment & Plan Note (Signed)
stable overall by history and exam, recent data reviewed with pt, and pt to continue medical treatment as before,  to f/u any worsening symptoms or concerns Lab Results  Component Value Date   HGBA1C 8.1 (H) 04/04/2016   Pt to call for onset polys or cbg > 200 on steroid tx

## 2016-04-05 NOTE — Assessment & Plan Note (Signed)
Mild worsening situational, declines change in tx or referral for counseling at this time, verified no SI or HI

## 2016-04-05 NOTE — Assessment & Plan Note (Addendum)
Not clear if more OA vs RA pain flare, for rheum referral,  to f/u any worsening symptoms or concerns

## 2016-04-15 ENCOUNTER — Encounter: Payer: Self-pay | Admitting: Internal Medicine

## 2016-04-23 ENCOUNTER — Ambulatory Visit: Payer: Medicare Other | Attending: Internal Medicine | Admitting: Physical Therapy

## 2016-04-23 DIAGNOSIS — R2681 Unsteadiness on feet: Secondary | ICD-10-CM | POA: Insufficient documentation

## 2016-04-23 DIAGNOSIS — M6281 Muscle weakness (generalized): Secondary | ICD-10-CM | POA: Insufficient documentation

## 2016-04-23 DIAGNOSIS — M25511 Pain in right shoulder: Secondary | ICD-10-CM | POA: Insufficient documentation

## 2016-04-23 NOTE — Therapy (Signed)
[facility] [address]  [address] [place], [place], [address] Phone: [phone]   Fax:  [phone]  Physical Therapy Evaluation  Patient Details  Name: [name] MRN: [id number] Date of Birth: 1941-05-27 Referring Provider: Dr. [name]  Encounter Date: 04/23/2016      PT End of Session - 04/23/16 1239    Visit Number 1   Number of Visits 16   Date for PT Re-Evaluation 06/18/16   PT Start Time 1100   PT Stop Time 1140   PT Time Calculation (min) 40 min   Activity Tolerance Patient tolerated treatment well   Behavior During Therapy [facility] for tasks assessed/performed      Past Medical History:  Diagnosis Date  . BENIGN PROSTATIC HYPERTROPHY 11/23/2009  . C O P D 07/30/2009  . CHRONIC OBSTRUCTIVE PULMONARY DISEASE, ACUTE EXACERBATION 03/01/2010  . CORONARY ARTERY DISEASE 11/23/2009  . 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 DEGENERATIVE JOINT DISEASE 11/23/2009  . DEPRESSION 11/23/2009  . DIABETES MELLITUS, TYPE II 11/23/2009  . DYSPNEA/SHORTNESS OF BREATH 12/08/2009  . FATIGUE 11/23/2009  . GAIT DISTURBANCE 12/10/2009  . HEMOPTYSIS UNSPECIFIED 05/07/2010  . HYPERTENSION 07/30/2009  . HYPOTHYROIDISM 07/30/2009  . 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 PTSD (post-traumatic stress disorder) 03/10/2012  . 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 RA (rheumatoid arthritis) (HCC) 06/11/2011  . RESPIRATORY FAILURE, CHRONIC 07/31/2009  . Scleritis of both eyes 03/17/2014  . THRUSH 11/06/2009  . 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    Past Surgical History:  Procedure Laterality Date  . ABDOMINAL AORTIC ANEURYSM REPAIR    . hx of remote ileum resection due to bleeding    . s/p back surgury   1972   lumbar laminectomy Dr. Fannie Knee  . s/p left femoral embolectomy with left leg ischemia     Dr. Hart Rochester, vascular  . s/p right ganglion cyst     Dr. Teressa Senter  . small bowel obstruction repair with adhesiolysis      There were no vitals filed for this  visit.       Subjective Assessment - 04/23/16 1101    Subjective Pt is a 75 y/o male who presents to OPPT for balance deficits and c/o R shoulder pain.  Pt with hx of "tear in R shoulder" and arthritis.     Pertinent History rheumatoid arthritis, COPD (O2 dependent on 2L, increases to 5L with exercise), DM with neuropathy, see snapshot for further information   Limitations Standing;Walking;House hold activities   How long can you stand comfortably? 5 min   How long can you walk comfortably? 1 hour with UE support; 5 min without UE support   Patient Stated Goals "get better"  wants to "move my joints"  improve balance   Currently in Pain? Yes   Pain Score 4    Pain Location Generalized  "knees and shoulders"   Pain Orientation Right;Left   Pain Descriptors / Indicators Sore   Pain Type Chronic pain   Pain Onset More than a month ago   Pain Frequency Constant   Aggravating Factors  prolonged sitting, lying down ("it gets stiff")   Pain Relieving Factors hot shower            [facility] PT Assessment - 04/23/16 1107      Assessment   Medical Diagnosis RA   Referring Provider Dr. [name]   Onset Date/Surgical Date --  3 wks ago - acute exacerbation; diagnosed RA since 1980s   Next MD  Visit 6 months   Prior Therapy none     Precautions   Precautions Fall     Restrictions   Weight Bearing Restrictions No     Balance Screen   Has the patient fallen in the past 6 months No   Has the patient had a decrease in activity level because of a fear of falling?  Yes   Is the patient reluctant to leave their home because of a fear of falling?  No     Home Tourist information centre manager residence   Living Arrangements Spouse/significant other   Type of Home House   Home Access Stairs to enter   Entrance Stairs-Number of Steps 1   Home Layout One level   Additional Comments pt is care provider for wife; wife is mostly independent     Prior Function   Level of  Independence Independent   Vocation Retired   Gaffer retired from [employer]   Leisure limited; goes to fitness center when able     Observation/Other Assessments   Focus on Therapeutic Outcomes (FOTO)  49 (51% limited; predicted 32% limited)     Posture/Postural Control   Posture/Postural Control Postural limitations   Postural Limitations Rounded Shoulders;Forward head     AROM   Overall AROM Comments bil shoulder ROM grossly WNL     Strength   Overall Strength Comments some pain with strength testing R shoulder   Strength Assessment Site Shoulder;Elbow;Hip;Knee;Ankle   Right/Left Shoulder Right;Left   Right Shoulder Flexion 3/5   Right Shoulder ABduction 3/5   Right Shoulder Internal Rotation 4/5   Right Shoulder External Rotation 3/5   Left Shoulder Flexion 3+/5   Left Shoulder ABduction 3+/5   Left Shoulder Internal Rotation 5/5   Left Shoulder External Rotation 4/5   Right/Left Elbow Right;Left   Right Elbow Flexion 4/5   Right Elbow Extension 5/5   Left Elbow Flexion 4/5   Left Elbow Extension 5/5   Right/Left Hip Right;Left   Right Hip Flexion 4/5   Left Hip Flexion 4/5   Right/Left Knee Right;Left   Right Knee Flexion 4/5   Right Knee Extension 3+/5   Left Knee Flexion 5/5   Left Knee Extension 4/5   Right/Left Ankle Right;Left   Right Ankle Dorsiflexion 4/5   Left Ankle Dorsiflexion 4/5     Ambulation/Gait   Gait velocity 3.34 ft/sec  9.81 sec     Standardized Balance Assessment   Standardized Balance Assessment Berg Balance Test;Timed Up and Go Test     Berg Balance Test   Sit to Stand Able to stand without using hands and stabilize independently   Standing Unsupported Able to stand safely 2 minutes   Sitting with Back Unsupported but Feet Supported on Floor or Stool Able to sit safely and securely 2 minutes   Stand to Sit Sits safely with minimal use of hands   Transfers Able to transfer safely, minor use of hands   Standing  Unsupported with Eyes Closed Able to stand 10 seconds safely   Standing Ubsupported with Feet Together Able to place feet together independently and stand for 1 minute with supervision   From Standing, Reach Forward with Outstretched Arm Can reach confidently >25 cm (10")   From Standing Position, Pick up Object from Floor Able to pick up shoe safely and easily   From Standing Position, Turn to Look Behind Over each Shoulder Needs supervision when turning   Turn 360 Degrees Able  to turn 360 degrees safely one side only in 4 seconds or less   Standing Unsupported, Alternately Place Feet on Step/Stool Able to complete 4 steps without aid or supervision   Standing Unsupported, One Foot in Front Able to plae foot ahead of the other independently and hold 30 seconds   Standing on One Leg Tries to lift leg/unable to hold 3 seconds but remains standing independently   Total Score 45     Timed Up and Go Test   Normal TUG (seconds) 11.19   Cognitive TUG (seconds) 9.81                             PT Short Term Goals - 05/12/16 1243      PT SHORT TERM GOAL #1   Title independent with initial HEP (05/21/16)   Time 4   Period Weeks   Status New     PT SHORT TERM GOAL #2   Title perform 6 MWT with appropriate goal to be written (05/21/16)   Time 4   Period Weeks   Status New           PT Long Term Goals - May 12, 2016 1244      PT LONG TERM GOAL #1   Title verbalize understanding of fall prevention strategies (06/18/16)   Time 8   Period Weeks   Status New     PT LONG TERM GOAL #2   Title improve BERG balance score to >/= 49/56 for improved balance (06/18/16)   Time 8   Period Weeks   Status New     PT LONG TERM GOAL #3   Title improve timed up and go to < 9 sec for improved function and mobility (06/18/16)   Time 8   Period Weeks   Status New     PT LONG TERM GOAL #4   Title improve R shoulder strength to at least 4/5 for improved strength and function  (06/18/16)   Time 8   Period Weeks   Status New               Plan - 05/12/16 1240    Clinical Impression Statement Pt is a 75 y/o male who presents to OPPT for high complexity PT eval for polyarthralgia and balance deficits.  Pt demonstrates mild high level balance deficits, and significant R shoulder weakness affecting functional mobility and ADLs.  Pt will benefit from PT to address these deficits.   Rehab Potential Good   PT Frequency 2x / week   PT Duration 8 weeks   PT Treatment/Interventions ADLs/Self Care Home Management;Cryotherapy;Electrical Stimulation;Iontophoresis 4mg /ml Dexamethasone;Moist Heat;Ultrasound;Neuromuscular re-education;Balance training;Therapeutic exercise;Therapeutic activities;Functional mobility training;Gait training;Patient/family education;Manual techniques;Dry needling;Taping;Vasopneumatic Device;Vestibular   PT Next Visit Plan perform (get pre/post vitals), establish UE strengthening HEP and LE strength/balance HEP   Consulted and Agree with Plan of Care Patient      Patient will benefit from skilled therapeutic intervention in order to improve the following deficits and impairments:  Pain, Decreased activity tolerance, Cardiopulmonary status limiting activity, Decreased strength, Difficulty walking, Decreased mobility, Decreased balance, Impaired perceived functional ability, Postural dysfunction  Visit Diagnosis: Muscle weakness (generalized) - Plan: PT plan of care cert/re-cert  Pain in right shoulder - Plan: PT plan of care cert/re-cert  Unsteadiness on feet - Plan: PT plan of care cert/re-cert      G-Codes - May 12, 2016 1257    Functional Assessment Tool Used FOTO 51% limited  Functional Limitation Mobility: Walking and moving around   Mobility: Walking and Moving Around Current Status [phone]) At least 40 percent but less than 60 percent impaired, limited or restricted   Mobility: Walking and Moving Around Goal Status [phone]) At least  20 percent but less than 40 percent impaired, limited or restricted       Problem List Patient Active Problem List   Diagnosis Date Noted  . Polyarthralgia 04/04/2016  . 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 Corn of foot 10/02/2015  . Post-nasal drip 12/11/2014  . COPD, severe (HCC) 08/04/2014  . Dysuria 05/04/2014  . Scleritis of both eyes 03/17/2014  . Hyperlipidemia 01/05/2014  . Ileus (HCC) 08/22/2013  . Community acquired pneumonia 07/19/2013  . COPD exacerbation (HCC) 05/31/2012  . Obesity 04/20/2012  . PTSD (post-traumatic stress disorder) 03/10/2012  . Orthostatic hypotension 02/02/2012  . Syncope 01/26/2012  . RA (rheumatoid arthritis) (HCC) 06/11/2011  . OSA on CPAP 06/10/2011  . LBBB (left bundle branch block) 03/25/2011  . Diastolic dysfunction 02/25/2011  . Acute and chronic respiratory failure 02/21/2011  . Insomnia 12/05/2010  . Erectile dysfunction 12/05/2010  . Preventative health care 12/04/2010  . 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 GAIT DISTURBANCE 12/10/2009  . Diabetes (HCC) 11/23/2009  . Depression 11/23/2009  . CORONARY ARTERY DISEASE 11/23/2009  . BENIGN PROSTATIC HYPERTROPHY 11/23/2009  . DEGENERATIVE JOINT DISEASE 11/23/2009  . FATIGUE 11/23/2009  . 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 RESPIRATORY FAILURE, CHRONIC 07/31/2009  . HYPOTHYROIDISM 07/30/2009  . Essential hypertension 07/30/2009  . [place] 07/30/2009       Clarita Crane, PT, DPT 04/23/16 1:00 PM     [facility] [address]  [address] [place], [place], [address] Phone: [phone]   Fax:  [phone]  Name: HOSIE SHARMAN MRN: [id number] Date of Birth: 05/05/1941

## 2016-04-25 ENCOUNTER — Ambulatory Visit: Payer: Medicare Other

## 2016-04-25 DIAGNOSIS — M25511 Pain in right shoulder: Secondary | ICD-10-CM

## 2016-04-25 DIAGNOSIS — R2681 Unsteadiness on feet: Secondary | ICD-10-CM

## 2016-04-25 DIAGNOSIS — M6281 Muscle weakness (generalized): Secondary | ICD-10-CM

## 2016-04-25 NOTE — Therapy (Signed)
[facility] [address]  [address] [place], [place], [address] Phone: [phone]   Fax:  [phone]  Physical Therapy Treatment  Patient Details  Name: [name] MRN: [id number] Date of Birth: 1940/10/11 Referring Provider: Dr. [name]  Encounter Date: 04/25/2016      PT End of Session - 04/25/16 0919    Visit Number 2   Number of Visits 16   Date for PT Re-Evaluation 06/18/16   PT Start Time 0850   PT Stop Time 0930   PT Time Calculation (min) 40 min   Activity Tolerance Patient tolerated treatment well   Behavior During Therapy [facility] for tasks assessed/performed      Past Medical History:  Diagnosis Date  . BENIGN PROSTATIC HYPERTROPHY 11/23/2009  . C O P D 07/30/2009  . CHRONIC OBSTRUCTIVE PULMONARY DISEASE, ACUTE EXACERBATION 03/01/2010  . CORONARY ARTERY DISEASE 11/23/2009  . 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 DEGENERATIVE JOINT DISEASE 11/23/2009  . DEPRESSION 11/23/2009  . DIABETES MELLITUS, TYPE II 11/23/2009  . DYSPNEA/SHORTNESS OF BREATH 12/08/2009  . FATIGUE 11/23/2009  . GAIT DISTURBANCE 12/10/2009  . HEMOPTYSIS UNSPECIFIED 05/07/2010  . HYPERTENSION 07/30/2009  . HYPOTHYROIDISM 07/30/2009  . 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 PTSD (post-traumatic stress disorder) 03/10/2012  . 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 RA (rheumatoid arthritis) (HCC) 06/11/2011  . RESPIRATORY FAILURE, CHRONIC 07/31/2009  . Scleritis of both eyes 03/17/2014  . THRUSH 11/06/2009  . 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    Past Surgical History:  Procedure Laterality Date  . ABDOMINAL AORTIC ANEURYSM REPAIR    . hx of remote ileum resection due to bleeding    . s/p back surgury   1972   lumbar laminectomy Dr. Fannie Knee  . s/p left femoral embolectomy with left leg ischemia     Dr. Hart Rochester, vascular  . s/p right ganglion cyst     Dr. Teressa Senter  . small bowel obstruction repair with adhesiolysis      There were no vitals filed for this  visit.      Subjective Assessment - 04/25/16 1001    Subjective Pt. seen initially today on 2 L O2 reporting he is feeling, "ok enough to make it here today".     Patient Stated Goals "get better"  wants to "move my joints"  improve balance   Currently in Pain? Yes   Pain Score 2    Pain Location Generalized   Pain Orientation Right;Left   Pain Descriptors / Indicators Sore   Pain Type Chronic pain            [facility] PT Assessment - 04/25/16 0847      6 Minute Walk- Baseline   6 Minute Walk- Baseline yes   BP (mmHg) 132/78   HR (bpm) 60   02 Sat (%RA) 88 %   Modified Borg Scale for Dyspnea 2- Mild shortness of breath   Perceived Rate of Exertion (Borg) 7- Very, very light     6 Minute walk- Post Test   6 Minute Walk Post Test yes   BP (mmHg) (!)  168/98  after 3 min 152/84 mmHg   HR (bpm) 84  after 3 min rest - 76   02 Sat (%RA) 77 %  after 3 min rest - 93 %   Modified Borg Scale for Dyspnea 4- somewhat severe   Perceived Rate of Exertion (Borg) 11- Fairly light     6 minute walk test results  Aerobic Endurance Distance Walked 1029   Endurance additional comments No rest breaks taken     Today's treatment:   6 MWT (see chart above for details) (4 L O2)  HEP creation (4 L O2 with all): Supine R shoulder AAROM with wand x 10 reps  Sitting R shoulder ER with wand x 10 reps  Standing R shoulder abduction with wand x 10 reps Standing holding onto counter:        Alternating hip abduction 2" x 10 reps        Alternating hip abduction 2" x 10 reps        Alternating hip abduction 2" x 10 reps        PT Education - 04/25/16 1015    Education provided Yes   Education Details standing 3 way SLR, shoulder flexion, ER, abduction AAROM with wand    Person(s) Educated Patient   Methods Handout;Explanation;Verbal cues   Comprehension Verbalized understanding;Returned demonstration;Need further instruction          PT Short Term Goals - 04/25/16 0945      PT  SHORT TERM GOAL #1   Title independent with initial HEP (05/21/16)   Time 4   Period Weeks   Status On-going     PT SHORT TERM GOAL #2   Title perform 6 MWT with appropriate goal to be written (05/21/16)   Time 4   Period Weeks   Status On-going           PT Long Term Goals - 04/25/16 1003      PT LONG TERM GOAL #1   Title verbalize understanding of fall prevention strategies (06/18/16)   Time 8   Period Weeks   Status On-going     PT LONG TERM GOAL #2   Title improve BERG balance score to >/= 49/56 for improved balance (06/18/16)   Time 8   Period Weeks   Status On-going     PT LONG TERM GOAL #3   Title improve timed up and go to < 9 sec for improved function and mobility (06/18/16)   Time 8   Period Weeks   Status On-going     PT LONG TERM GOAL #4   Title improve R shoulder strength to at least 4/5 for improved strength and function (06/18/16)   Time 8   Period Weeks   Status On-going               Plan - 04/25/16 0920    Clinical Impression Statement Pt. seen initially today on 2L O2 today with vitals WFL.  Pt. able to perform without sitting rest breaks with post-test vitals responding within exercise limits.  Post-test BP a little high at 168/98 mmHg immediately following test however this, along with all other vitals recovered quickly toward baseline with sitting rest.  Initial shoulder and balance HEP issued to pt. today with plans to review this next visit.     PT Treatment/Interventions ADLs/Self Care Home Management;Cryotherapy;Electrical Stimulation;Iontophoresis 4mg /ml Dexamethasone;Moist Heat;Ultrasound;Neuromuscular re-education;Balance training;Therapeutic exercise;Therapeutic activities;Functional mobility training;Gait training;Patient/family education;Manual techniques;Dry needling;Taping;Vasopneumatic Device;Vestibular   PT Next Visit Plan Review UE strengthening HEP and LE strength/balance HEP      Patient will benefit from skilled  therapeutic intervention in order to improve the following deficits and impairments:  Pain, Decreased activity tolerance, Cardiopulmonary status limiting activity, Decreased strength, Difficulty walking, Decreased mobility, Decreased balance, Impaired perceived functional ability, Postural dysfunction  Visit Diagnosis: Muscle weakness (generalized)  Pain in right shoulder  Unsteadiness on feet     Problem List Patient Active Problem List   Diagnosis Date Noted  . Polyarthralgia 04/04/2016  . 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 Corn of foot 10/02/2015  . Post-nasal drip 12/11/2014  . COPD, severe (HCC) 08/04/2014  . Dysuria 05/04/2014  . Scleritis of both eyes 03/17/2014  . Hyperlipidemia 01/05/2014  . Ileus (HCC) 08/22/2013  . Community acquired pneumonia 07/19/2013  . COPD exacerbation (HCC) 05/31/2012  . Obesity 04/20/2012  . PTSD (post-traumatic stress disorder) 03/10/2012  . Orthostatic hypotension 02/02/2012  . Syncope 01/26/2012  . RA (rheumatoid arthritis) (HCC) 06/11/2011  . OSA on CPAP 06/10/2011  . LBBB (left bundle branch block) 03/25/2011  . Diastolic dysfunction 02/25/2011  . Acute and chronic respiratory failure 02/21/2011  . Insomnia 12/05/2010  . Erectile dysfunction 12/05/2010  . Preventative health care 12/04/2010  . 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 GAIT DISTURBANCE 12/10/2009  . Diabetes (HCC) 11/23/2009  . Depression 11/23/2009  . CORONARY ARTERY DISEASE 11/23/2009  . BENIGN PROSTATIC HYPERTROPHY 11/23/2009  . DEGENERATIVE JOINT DISEASE 11/23/2009  . FATIGUE 11/23/2009  . 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 RESPIRATORY FAILURE, CHRONIC 07/31/2009  . HYPOTHYROIDISM 07/30/2009  . Essential hypertension 07/30/2009  . [place] 07/30/2009    Kermit Balo, PTA 04/25/2016, 10:16 AM  [facility] [address]  [address] [place], [place],  [address] Phone: [phone]   Fax:  [phone]  Name: [name] MRN: [id number] Date of Birth: [birth year]

## 2016-04-27 ENCOUNTER — Emergency Department (HOSPITAL_BASED_OUTPATIENT_CLINIC_OR_DEPARTMENT_OTHER)
Admission: EM | Admit: 2016-04-27 | Discharge: 2016-04-27 | Disposition: A | Discharge status: Home / Self Care | Payer: Medicare Other | Attending: Emergency Medicine | Admitting: Emergency Medicine

## 2016-04-27 ENCOUNTER — Encounter (HOSPITAL_BASED_OUTPATIENT_CLINIC_OR_DEPARTMENT_OTHER): Payer: Self-pay | Admitting: *Deleted

## 2016-04-27 DIAGNOSIS — E039 Hypothyroidism, unspecified: Secondary | ICD-10-CM | POA: Insufficient documentation

## 2016-04-27 DIAGNOSIS — Z79899 Other long term (current) drug therapy: Secondary | ICD-10-CM | POA: Diagnosis not present

## 2016-04-27 DIAGNOSIS — I10 Essential (primary) hypertension: Secondary | ICD-10-CM | POA: Insufficient documentation

## 2016-04-27 DIAGNOSIS — M2550 Pain in unspecified joint: Secondary | ICD-10-CM | POA: Insufficient documentation

## 2016-04-27 DIAGNOSIS — Z87891 Personal history of nicotine dependence: Secondary | ICD-10-CM | POA: Diagnosis not present

## 2016-04-27 DIAGNOSIS — Z7984 Long term (current) use of oral hypoglycemic drugs: Secondary | ICD-10-CM | POA: Insufficient documentation

## 2016-04-27 DIAGNOSIS — E119 Type 2 diabetes mellitus without complications: Secondary | ICD-10-CM | POA: Diagnosis not present

## 2016-04-27 DIAGNOSIS — I2510 Atherosclerotic heart disease of native coronary artery without angina pectoris: Secondary | ICD-10-CM | POA: Diagnosis not present

## 2016-04-27 DIAGNOSIS — Z794 Long term (current) use of insulin: Secondary | ICD-10-CM | POA: Insufficient documentation

## 2016-04-27 DIAGNOSIS — J441 Chronic obstructive pulmonary disease with (acute) exacerbation: Secondary | ICD-10-CM | POA: Diagnosis not present

## 2016-04-27 MED ORDER — OXYCODONE-ACETAMINOPHEN 5-325 MG PO TABS: 1.0000 | ORAL_TABLET | ORAL | 0 refills | Status: DC | PRN

## 2016-04-27 NOTE — ED Triage Notes (Signed)
Pt reports joint pain in shoulders and knees x2wks. Reports being seen by PCP and states he received a shot and pain meds. Reports relief for about 1 wk post visit but states the symptoms have started up again. Denies fever, n/v/d, numbness/tingling in extremities.

## 2016-04-27 NOTE — Discharge Instructions (Signed)
Return to the ED with any concerns including fever, redness overlying joints, or any other alarming symptoms

## 2016-04-27 NOTE — ED Notes (Signed)
Pt reports he's been referred to a rheumatologist (states he's been unable to get an appt) and is currently in physical therapy.

## 2016-04-27 NOTE — ED Provider Notes (Signed)
[facility] Provider Note   CSN: [id number] Arrival date & time: 04/27/16  0913     History   Chief Complaint Chief Complaint  Patient presents with  . Joint Pain    HPI [name] is a 75 y.o. male.  HPI  Pt with hx of COPD on 2L O2 RA versus OA presenting with ongoing joint pains.  He was given course of prednisone and vicodin 2 weeks ago by his PMD and started on physical therapy.  He was referred to rheumatologist but has not yet been able to get an appointment. Pt is having ongoing pain in bilateral shoulders and bilateral knees.  No fever/chills.  No warmth or redness overlying joints.  He has not had new injury.  There are no other associated systemic symptoms, there are no other alleviating or modifying factors.   Past Medical History:  Diagnosis Date  . BENIGN PROSTATIC HYPERTROPHY 11/23/2009  . C O P D 07/30/2009  . CHRONIC OBSTRUCTIVE PULMONARY DISEASE, ACUTE EXACERBATION 03/01/2010  . CORONARY ARTERY DISEASE 11/23/2009  . 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 DEGENERATIVE JOINT DISEASE 11/23/2009  . DEPRESSION 11/23/2009  . DIABETES MELLITUS, TYPE II 11/23/2009  . DYSPNEA/SHORTNESS OF BREATH 12/08/2009  . FATIGUE 11/23/2009  . GAIT DISTURBANCE 12/10/2009  . HEMOPTYSIS UNSPECIFIED 05/07/2010  . HYPERTENSION 07/30/2009  . HYPOTHYROIDISM 07/30/2009  . 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 PTSD (post-traumatic stress disorder) 03/10/2012  . 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 RA (rheumatoid arthritis) (HCC) 06/11/2011  . RESPIRATORY FAILURE, CHRONIC 07/31/2009  . Scleritis of both eyes 03/17/2014  . THRUSH 11/06/2009  . 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    Patient Active Problem List   Diagnosis Date Noted  . Polyarthralgia 04/04/2016  . 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 Corn of foot 10/02/2015  . Post-nasal drip 12/11/2014  . COPD, severe (HCC) 08/04/2014  . Dysuria 05/04/2014  . Scleritis of both eyes 03/17/2014  . Hyperlipidemia 01/05/2014  . Ileus  (HCC) 08/22/2013  . Community acquired pneumonia 07/19/2013  . COPD exacerbation (HCC) 05/31/2012  . Obesity 04/20/2012  . PTSD (post-traumatic stress disorder) 03/10/2012  . Orthostatic hypotension 02/02/2012  . Syncope 01/26/2012  . RA (rheumatoid arthritis) (HCC) 06/11/2011  . OSA on CPAP 06/10/2011  . LBBB (left bundle branch block) 03/25/2011  . Diastolic dysfunction 02/25/2011  . Acute and chronic respiratory failure (HCC) 02/21/2011  . Insomnia 12/05/2010  . Erectile dysfunction 12/05/2010  . Preventative health care 12/04/2010  . 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 GAIT DISTURBANCE 12/10/2009  . Diabetes (HCC) 11/23/2009  . Depression 11/23/2009  . CORONARY ARTERY DISEASE 11/23/2009  . BENIGN PROSTATIC HYPERTROPHY 11/23/2009  . DEGENERATIVE JOINT DISEASE 11/23/2009  . FATIGUE 11/23/2009  . 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 RESPIRATORY FAILURE, CHRONIC 07/31/2009  . HYPOTHYROIDISM 07/30/2009  . Essential hypertension 07/30/2009  . C O P D 07/30/2009    Past Surgical History:  Procedure Laterality Date  . ABDOMINAL AORTIC ANEURYSM REPAIR    . hx of remote ileum resection due to bleeding    . s/p back surgury   1972   lumbar laminectomy Dr. Fannie Knee  . s/p left femoral embolectomy with left leg ischemia     Dr. Hart Rochester, vascular  . s/p right ganglion cyst     Dr. Teressa Senter  . small bowel obstruction repair with adhesiolysis         Home Medications    Prior to Admission medications   Medication  Sig Start Date End Date Taking? Authorizing Provider  albuterol (PROAIR HFA) 108 (90 BASE) MCG/ACT inhaler Inhale 2 puffs into the lungs every 4 (four) hours as needed. Shortness of breath 10/09/11  Yes Kalman Shan, MD  albuterol (PROVENTIL) (2.5 MG/3ML) 0.083% nebulizer solution 1 vial in nebulizer every 6 hours as needed Dx 496 05/31/12  Yes Kalman Shan, MD  ALPRAZolam Prudy Feeler) 0.5 MG tablet Take 0.5 mg by mouth at bedtime. For sleep  and anxiety   Yes Historical Provider, MD  atorvastatin (LIPITOR) 40 MG tablet Take 10 mg by mouth daily. 12/23/13  Yes Pricilla Riffle, MD  fluticasone [facility]) 50 MCG/ACT nasal spray Place 2 sprays into both nostrils daily. Patient taking differently: Place 2 sprays into both nostrils daily as needed.  12/11/14  Yes Kalman Shan, MD  gabapentin (NEURONTIN) 800 MG tablet Take 800 mg by mouth 2 (two) times daily.     Yes Historical Provider, MD  hydroxychloroquine (PLAQUENIL) 200 MG tablet Take 200 mg by mouth daily.   Yes Historical Provider, MD  insulin glargine (LANTUS) 100 UNIT/ML injection Inject 70 Units into the skin at bedtime.   Yes Historical Provider, MD  Levothyroxine Sodium (SYNTHROID PO) Take 0.2 mg by mouth every morning.   Yes Historical Provider, MD  Liraglutide (VICTOZA) 18 MG/3ML SOPN 1.2 daily   Yes Historical Provider, MD  losartan (COZAAR) 50 MG tablet Take 1 tablet (50 mg total) by mouth daily. 08/24/13  Yes Zannie Cove, MD  metFORMIN (GLUCOPHAGE) 1000 MG tablet Take 1,000 mg by mouth 2 (two) times daily with a meal.   Yes Historical Provider, MD  mometasone (ASMANEX 60 METERED DOSES) 220 MCG/INH inhaler Inhale 2 puffs into the lungs daily.     Yes Historical Provider, MD  Multiple Vitamins-Minerals (MEGA MULTIVITAMIN FOR MEN PO) Take by mouth daily.     Yes Historical Provider, MD  Olodaterol HCl (STRIVERDI RESPIMAT) 2.5 MCG/ACT AERS Inhale 2.5 Inhalers into the lungs 1 day or 1 dose. 2 inhalations once a day   Yes Historical Provider, MD  sertraline (ZOLOFT) 100 MG tablet 100 mg. Take 1/2 by mouth daily as needed   Yes Historical Provider, MD  Tamsulosin HCl (FLOMAX) 0.4 MG CAPS Take by mouth at bedtime.     Yes Historical Provider, MD  tiotropium (SPIRIVA HANDIHALER) 18 MCG inhalation capsule Place 18 mcg into inhaler and inhale daily.     Yes Historical Provider, MD  HYDROcodone-acetaminophen (NORCO) 7.5-325 MG tablet Take 1 tablet by mouth every 6 (six) hours as needed  for moderate pain or severe pain. Patient not taking: Reported on 04/23/2016 04/04/16   Corwin Levins, MD  oxyCODONE-acetaminophen (ROXICET) 5-325 MG tablet Take 1-2 tablets by mouth every 4 (four) hours as needed for severe pain. 04/27/16   Jerelyn Scott, MD  predniSONE (DELTASONE) 10 MG tablet 4tab by mouth per day x 3 days,3tabs x 3day,2tab x 3day,1 tab x 3 day Patient not taking: Reported on 04/23/2016 04/04/16   Corwin Levins, MD    Family History Family History  Problem Relation Age of Onset  . Other Mother     gun shot    Social History Social History  Substance Use Topics  . Smoking status: Former Smoker    Packs/day: 2.50    Years: 40.00    Types: Cigarettes, Pipe, Cigars    Quit date: 07/28/1998  . Smokeless tobacco: Never Used     Comment: quit in 2000 x 40 yrs. 2.5 ppd  . Alcohol  use No     Allergies   Review of patient's allergies indicates no known allergies.   Review of Systems Review of Systems  ROS reviewed and all otherwise negative except for mentioned in HPI   Physical Exam Updated Vital Signs BP 148/64 (BP Location: Left Arm)   Pulse 73   Temp 98 F (36.7 C) (Oral)   Resp 24   Ht 6\' 3"  (1.905 m)   Wt 237 lb (107.5 kg)   SpO2 92% Comment: pt has home O2 at bedside  BMI 29.62 kg/m  Vitals reviewed Physical Exam Physical Examination: General appearance - alert, chronically ill appearing, and in no distress Mental status - alert, oriented to person, place, and time Eyes - no conjunctival injection, no scleral icterus Chest - clear to auscultation, no wheezes, rales or rhonchi, symmetric air entry Heart - normal rate, regular rhythm, normal S1, S2, no murmurs, rubs, clicks or gallops Neurological - alert, oriented, normal speech Musculoskeletal - bilateral shoulders and knees with ttp and pain with ROM, no overlying erythema or warmth of joints, no deformity or swelling Extremities - peripheral pulses normal, no pedal edema, no clubbing or cyanosis Skin  - normal coloration and turgor, no rashes  ED Treatments / Results  Labs (all labs ordered are listed, but only abnormal results are displayed) Labs Reviewed - No data to display  EKG  EKG Interpretation None       Radiology No results found.  Procedures Procedures (including critical care time)  Medications Ordered in ED Medications - No data to display   Initial Impression / Assessment and Plan / ED Course  I have reviewed the triage vital signs and the nursing notes.  Pertinent labs & imaging results that were available during my care of the patient were reviewed by me and considered in my medical decision making (see chart for details).  Clinical Course    Pt presenting with c/o ongoing joint pain in his shoulders and knees.  He has been referred to see rheumatologist - had diagnosis of RA but it has been years since he had any acute issues- he is currently attending physical therapy.  He just completed a course of steroids several days ago.  Pt given meds for pain control to help his pain until he can follow back up with PMD as well as rheumatologist as planned.  No new injuries, doubt septic joint.  Discharged with strict return precautions.  Pt agreeable with plan.  Final Clinical Impressions(s) / ED Diagnoses   Final diagnoses:  Arthralgia of multiple joints    New Prescriptions Discharge Medication List as of 04/27/2016 10:14 AM    START taking these medications   Details  oxyCODONE-acetaminophen (ROXICET) 5-325 MG tablet Take 1-2 tablets by mouth every 4 (four) hours as needed for severe pain., Starting Sun 04/27/2016, Print         06/27/2016, MD 04/27/16 1316

## 2016-04-28 ENCOUNTER — Telehealth: Payer: Self-pay | Admitting: Internal Medicine

## 2016-04-28 ENCOUNTER — Ambulatory Visit (INDEPENDENT_AMBULATORY_CARE_PROVIDER_SITE_OTHER): Payer: Medicare Other | Admitting: Internal Medicine

## 2016-04-28 ENCOUNTER — Encounter: Payer: Self-pay | Admitting: Internal Medicine

## 2016-04-28 VITALS — BP 158/92 | HR 79 | Ht 75.0 in | Wt 244.0 lb

## 2016-04-28 DIAGNOSIS — I10 Essential (primary) hypertension: Secondary | ICD-10-CM | POA: Diagnosis not present

## 2016-04-28 NOTE — Telephone Encounter (Signed)
Patient states he was referred to a neurologist but can not get in until Nov 30th.  Patient states he can not wait that long and wants to know what to do.

## 2016-04-28 NOTE — Progress Notes (Signed)
Cardiology Office Note   Date:  04/28/2016   ID:  [name], DOB 1941-07-06, MRN [id number]  PCP:  [name], MD  Cardiologist:   [name], MD   F/U of CAD    History of Present Illness: [name] is a 75 y.o. male with a history of COPD, coronary calcifications.  2 heart caths in the past  One in 2000s showed one ulcerated plaque  Rx medically  Second at [facility]  No signif CAD   I saw him in Sept 2017     Occasional dizziness  If looks up  Also if bending over then dizzy if stands  No CP      Outpatient Medications Prior to Visit  Medication Sig Dispense Refill  . albuterol (PROAIR HFA) 108 (90 BASE) MCG/ACT inhaler Inhale 2 puffs into the lungs every 4 (four) hours as needed. Shortness of breath 1 Inhaler 6  . albuterol (PROVENTIL) (2.5 MG/3ML) 0.083% nebulizer solution 1 vial in nebulizer every 6 hours as needed Dx 496 120 mL 6  . ALPRAZolam (XANAX) 0.5 MG tablet Take 0.5 mg by mouth at bedtime. For sleep and anxiety    . atorvastatin (LIPITOR) 40 MG tablet Take 10 mg by mouth daily.    . fluticasone (FLONASE) 50 MCG/ACT nasal spray Place 2 sprays into both nostrils daily. (Patient taking differently: Place 2 sprays into both nostrils daily as needed. ) 16 g 2  . gabapentin (NEURONTIN) 800 MG tablet Take 800 mg by mouth 2 (two) times daily.      [name] HYDROcodone-acetaminophen (NORCO) 7.5-325 MG tablet Take 1 tablet by mouth every 6 (six) hours as needed for moderate pain or severe pain. 100 tablet 0  . hydroxychloroquine (PLAQUENIL) 200 MG tablet Take 200 mg by mouth daily.    . insulin glargine (LANTUS) 100 UNIT/ML injection Inject 70 Units into the skin at bedtime.    . Levothyroxine Sodium (SYNTHROID PO) Take 0.2 mg by mouth every morning.    . Liraglutide (VICTOZA) 18 MG/3ML SOPN 1.2 daily    . losartan (COZAAR) 50 MG tablet Take 1 tablet (50 mg total) by mouth daily. 30 tablet 0  . metFORMIN (GLUCOPHAGE) 1000 MG tablet Take 1,000 mg by mouth 2 (two) times  daily with a meal.    . mometasone (ASMANEX 60 METERED DOSES) 220 MCG/INH inhaler Inhale 2 puffs into the lungs daily.      . Multiple Vitamins-Minerals (MEGA MULTIVITAMIN FOR MEN PO) Take by mouth daily.      . Olodaterol HCl (STRIVERDI RESPIMAT) 2.5 MCG/ACT AERS Inhale 2.5 Inhalers into the lungs 1 day or 1 dose. 2 inhalations once a day    . sertraline (ZOLOFT) 100 MG tablet 100 mg. Take 1/2 by mouth daily as needed    . Tamsulosin HCl (FLOMAX) 0.4 MG CAPS Take by mouth at bedtime.      [name] tiotropium (SPIRIVA HANDIHALER) 18 MCG inhalation capsule Place 18 mcg into inhaler and inhale daily.      [name] oxyCODONE-acetaminophen (ROXICET) 5-325 MG tablet Take 1-2 tablets by mouth every 4 (four) hours as needed for severe pain. (Patient not taking: Reported on 04/28/2016) 15 tablet 0  . predniSONE (DELTASONE) 10 MG tablet 4tab by mouth per day x 3 days,3tabs x 3day,2tab x 3day,1 tab x 3 day (Patient not taking: Reported on 04/28/2016) 30 tablet 0   No facility-administered medications prior to visit.      Allergies:   Review of patient's allergies indicates no  known allergies.   Past Medical History:  Diagnosis Date  . BENIGN PROSTATIC HYPERTROPHY 11/23/2009  . C O P D 07/30/2009  . CHRONIC OBSTRUCTIVE PULMONARY DISEASE, ACUTE EXACERBATION 03/01/2010  . CORONARY ARTERY DISEASE 11/23/2009  . 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 DEGENERATIVE JOINT DISEASE 11/23/2009  . DEPRESSION 11/23/2009  . DIABETES MELLITUS, TYPE II 11/23/2009  . DYSPNEA/SHORTNESS OF BREATH 12/08/2009  . FATIGUE 11/23/2009  . GAIT DISTURBANCE 12/10/2009  . HEMOPTYSIS UNSPECIFIED 05/07/2010  . HYPERTENSION 07/30/2009  . HYPOTHYROIDISM 07/30/2009  . 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 PTSD (post-traumatic stress disorder) 03/10/2012  . 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 RA (rheumatoid arthritis) (HCC) 06/11/2011  . RESPIRATORY FAILURE, CHRONIC 07/31/2009  . Scleritis of both eyes 03/17/2014  . THRUSH 11/06/2009  . 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     Past Surgical History:  Procedure Laterality Date  . ABDOMINAL AORTIC ANEURYSM REPAIR    . hx of remote ileum resection due to bleeding    . s/p back surgury   1972   lumbar laminectomy Dr. Fannie Knee  . s/p left femoral embolectomy with left leg ischemia     Dr. Hart Rochester, vascular  . s/p right ganglion cyst     Dr. Teressa Senter  . small bowel obstruction repair with adhesiolysis       Social History:  The patient  reports that he quit smoking about 17 years ago. His smoking use included Cigarettes, Pipe, and Cigars. He has a 100.00 pack-year smoking history. He has never used smokeless tobacco. He reports that he does not drink alcohol or use drugs.   Family History:  The patient's family history includes Other in his mother.    ROS:  Please see the history of present illness. All other systems are reviewed and  Negative to the above problem except as noted.    PHYSICAL EXAM: VS:  BP (!) 158/92   Pulse 79   Ht 6\' 3"  (1.905 m)   Wt 244 lb (110.7 kg)   BMI 30.50 kg/m   GEN: Well nourished, well developed, in no acute distress  HEENT: normal  Neck: no JVD, carotid bruits, or masses Cardiac: RRR; no murmurs, rubs, or gallops,no edema  Respiratory:  clear to auscultation bilaterally, normal work of breathing GI: soft, nontender, nondistended, + BS  No hepatomegaly  MS: no deformity Moving all extremities   Skin: warm and dry, no rash Neuro:  Strength and sensation are intact Psych: euthymic mood, full affect   EKG:  EKG is ordered today.  SR 79 bpm  RBBB.  Occasional PVC   Lipid Panel    Component Value Date/Time   CHOL 145 04/04/2016 1047   TRIG 173.0 (H) 04/04/2016 1047   HDL 30.20 (L) 04/04/2016 1047   CHOLHDL 5 04/04/2016 1047   VLDL 34.6 04/04/2016 1047   LDLCALC 80 04/04/2016 1047   LDLDIRECT 66.0 10/02/2015 0942      Wt Readings from Last 3 Encounters:  04/28/16 244 lb (110.7 kg)  04/27/16 237 lb (107.5 kg)  04/04/16 236 lb (107 kg)      ASSESSMENT AND  PLAN:  1  Coronary calcifications   No symptoms to sugg angina  2  COPD  Severe  Continue O2 therapy     Current medicines are reviewed at length with the patient today.  The patient does not have concerns regarding medicines.   Signed, 06/04/16, MD  04/28/2016 3:41 PM    [facility] [address] [place], [place], [place]  [address] Phone: [phone]; Fax: [phone]

## 2016-04-28 NOTE — Patient Instructions (Signed)
Your physician recommends that you continue on your current medications as directed. Please refer to the Current Medication list given to you today.  Your physician wants you to follow-up in: August, 2018 with Dr. Ross.  You will receive a reminder letter in the mail two months in advance. If you don't receive a letter, please call our office to schedule the follow-up appointment.  

## 2016-04-28 NOTE — Telephone Encounter (Signed)
This was actually for his rheumatology referral and I sent the referral to [facility]

## 2016-04-30 ENCOUNTER — Ambulatory Visit: Payer: Medicare Other

## 2016-05-02 ENCOUNTER — Ambulatory Visit: Payer: Medicare Other

## 2016-05-05 ENCOUNTER — Ambulatory Visit (INDEPENDENT_AMBULATORY_CARE_PROVIDER_SITE_OTHER): Payer: Medicare Other | Admitting: Sports Medicine

## 2016-05-05 DIAGNOSIS — M19019 Primary osteoarthritis, unspecified shoulder: Secondary | ICD-10-CM

## 2016-05-05 DIAGNOSIS — M25512 Pain in left shoulder: Secondary | ICD-10-CM

## 2016-05-07 ENCOUNTER — Ambulatory Visit: Payer: Medicare Other | Attending: Internal Medicine | Admitting: Physical Therapy

## 2016-05-07 VITALS — BP 180/88 | HR 88

## 2016-05-07 DIAGNOSIS — G8929 Other chronic pain: Secondary | ICD-10-CM | POA: Diagnosis present

## 2016-05-07 DIAGNOSIS — R2681 Unsteadiness on feet: Secondary | ICD-10-CM | POA: Diagnosis present

## 2016-05-07 DIAGNOSIS — M6281 Muscle weakness (generalized): Secondary | ICD-10-CM

## 2016-05-07 DIAGNOSIS — M25511 Pain in right shoulder: Secondary | ICD-10-CM | POA: Diagnosis present

## 2016-05-07 NOTE — Therapy (Signed)
[facility] [address]  [address] [place], [place], [address] Phone: [phone]   Fax:  [phone]  Physical Therapy Treatment  Patient Details  Name: [name] MRN: [id number] Date of Birth: Sep 09, 1940 Referring Provider: Dr. [name]  Encounter Date: 05/07/2016      PT End of Session - 05/07/16 0942    Visit Number 3   Number of Visits 16   Date for PT Re-Evaluation 06/18/16   PT Start Time 0844   PT Stop Time 0928   PT Time Calculation (min) 44 min   Activity Tolerance Patient tolerated treatment well;Treatment limited secondary to medical complications (Comment)  BP and O2 monitored throughout   Behavior During Therapy [facility] for tasks assessed/performed      Past Medical History:  Diagnosis Date  . BENIGN PROSTATIC HYPERTROPHY 11/23/2009  . C O P D 07/30/2009  . CHRONIC OBSTRUCTIVE PULMONARY DISEASE, ACUTE EXACERBATION 03/01/2010  . CORONARY ARTERY DISEASE 11/23/2009  . 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 DEGENERATIVE JOINT DISEASE 11/23/2009  . DEPRESSION 11/23/2009  . DIABETES MELLITUS, TYPE II 11/23/2009  . DYSPNEA/SHORTNESS OF BREATH 12/08/2009  . FATIGUE 11/23/2009  . GAIT DISTURBANCE 12/10/2009  . HEMOPTYSIS UNSPECIFIED 05/07/2010  . HYPERTENSION 07/30/2009  . HYPOTHYROIDISM 07/30/2009  . 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 PTSD (post-traumatic stress disorder) 03/10/2012  . 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 RA (rheumatoid arthritis) (HCC) 06/11/2011  . RESPIRATORY FAILURE, CHRONIC 07/31/2009  . Scleritis of both eyes 03/17/2014  . THRUSH 11/06/2009  . 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    Past Surgical History:  Procedure Laterality Date  . ABDOMINAL AORTIC ANEURYSM REPAIR    . hx of remote ileum resection due to bleeding    . s/p back surgury   1972   lumbar laminectomy Dr. Fannie Knee  . s/p left femoral embolectomy with left leg ischemia     Dr. Hart Rochester, vascular  . s/p right ganglion cyst     Dr. Teressa Senter  .  small bowel obstruction repair with adhesiolysis      Vitals:   05/07/16 0851 05/07/16 0904 05/07/16 0908 05/07/16 0927  BP: (!) 160/70 (!) 180/64 (!) 174/90 (!) 180/88  Pulse: 93 100  88  SpO2: 91% 94%  95%        Subjective Assessment - 05/07/16 0844    Subjective has been having some "dizzy spells."     Patient Stated Goals "get better"  wants to "move my joints"  improve balance   Currently in Pain? Yes   Pain Score 6   6/10 in shoulders; other joints 4-5/10   Pain Location --  joints   Pain Onset More than a month ago   Pain Frequency Constant   Aggravating Factors  prolonged sitting, lying down   Pain Relieving Factors hot shower, medication                         [facility] Adult PT Treatment/Exercise - 05/07/16 0859      Exercises   Exercises Knee/Hip;Shoulder;Other Exercises   Other Exercises  frequent rest breaks needed and BP monitored throughout session     Knee/Hip Exercises: Aerobic   Nustep L4 x 8 min - stopped after 4:00 O2 88% on 2L O2 with return to 93% after 1 min rest     Knee/Hip Exercises: Standing   Hip Flexion Both;10 reps;Knee straight   Hip Abduction Both;10 reps;Knee straight   Hip Extension Both;10 reps;Knee  straight     Shoulder Exercises: Seated   External Rotation Both;10 reps;AAROM   External Rotation Limitations cane   Flexion Both;10 reps;AAROM   Flexion Limitations cane   Abduction Both;10 reps;AAROM   ABduction Limitations cane                  PT Short Term Goals - 05/07/16 0942      PT SHORT TERM GOAL #1   Title independent with initial HEP (05/21/16)   Status On-going     PT SHORT TERM GOAL #2   Title perform 6 MWT with appropriate goal to be written (05/21/16)   Status Achieved           PT Long Term Goals - 05/07/16 0943      PT LONG TERM GOAL #1   Title verbalize understanding of fall prevention strategies (06/18/16)   Status On-going     PT LONG TERM GOAL #2   Title improve BERG  balance score to >/= 49/56 for improved balance (06/18/16)   Status On-going     PT LONG TERM GOAL #3   Title improve timed up and go to < 9 sec for improved function and mobility (06/18/16)   Status On-going     PT LONG TERM GOAL #4   Title improve R shoulder strength to at least 4/5 for improved strength and function (06/18/16)   Status On-going     PT LONG TERM GOAL #5   Title amb at least 1180' in for improved endurance and functional mobility (06/18/16)   Time 8   Period Weeks   Status New               Plan - 05/07/16 0944    Clinical Impression Statement Pt tolerated session well today but needed increased therapeutic rest breaks due to elevated BP and slightly decreased O2 sats.  Pt with limited compliance with HEP at this time due to increased swelling, and reinforced importance of completing HEP as tolerated.   PT Treatment/Interventions ADLs/Self Care Home Management;Cryotherapy;Electrical Stimulation;Iontophoresis 4mg /ml Dexamethasone;Moist Heat;Ultrasound;Neuromuscular re-education;Balance training;Therapeutic exercise;Therapeutic activities;Functional mobility training;Gait training;Patient/family education;Manual techniques;Dry needling;Taping;Vasopneumatic Device;Vestibular   PT Next Visit Plan continue strengthening, balance, endurance; monitor vitals      Patient will benefit from skilled therapeutic intervention in order to improve the following deficits and impairments:  Pain, Decreased activity tolerance, Cardiopulmonary status limiting activity, Decreased strength, Difficulty walking, Decreased mobility, Decreased balance, Impaired perceived functional ability, Postural dysfunction  Visit Diagnosis: Muscle weakness (generalized)  Chronic right shoulder pain  Unsteadiness on feet     Problem List Patient Active Problem List   Diagnosis Date Noted  . Polyarthralgia 04/04/2016  . 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 Corn of foot  10/02/2015  . Post-nasal drip 12/11/2014  . COPD, severe (HCC) 08/04/2014  . Dysuria 05/04/2014  . Scleritis of both eyes 03/17/2014  . Hyperlipidemia 01/05/2014  . Ileus (HCC) 08/22/2013  . Community acquired pneumonia 07/19/2013  . COPD exacerbation (HCC) 05/31/2012  . Obesity 04/20/2012  . PTSD (post-traumatic stress disorder) 03/10/2012  . Orthostatic hypotension 02/02/2012  . Syncope 01/26/2012  . RA (rheumatoid arthritis) (HCC) 06/11/2011  . OSA on CPAP 06/10/2011  . LBBB (left bundle branch block) 03/25/2011  . Diastolic dysfunction 02/25/2011  . Acute and chronic respiratory failure 02/21/2011  . Insomnia 12/05/2010  . Erectile dysfunction 12/05/2010  . Preventative health care 12/04/2010  . 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  GAIT DISTURBANCE 12/10/2009  . Diabetes (HCC) 11/23/2009  . Depression 11/23/2009  . Coronary atherosclerosis 11/23/2009  . BENIGN PROSTATIC HYPERTROPHY 11/23/2009  . DEGENERATIVE JOINT DISEASE 11/23/2009  . FATIGUE 11/23/2009  . 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 RESPIRATORY FAILURE, CHRONIC 07/31/2009  . HYPOTHYROIDISM 07/30/2009  . Essential hypertension 07/30/2009  . [place] 07/30/2009        Clarita Crane, PT, DPT 05/07/16 9:57 AM    [facility] [address]  [address] [place], [place], [address] Phone: [phone]   Fax:  [phone]  Name: [name] MRN: [id number] Date of Birth: 06-Apr-1941

## 2016-05-09 ENCOUNTER — Ambulatory Visit: Payer: Medicare Other

## 2016-05-09 VITALS — BP 164/88

## 2016-05-09 DIAGNOSIS — M25511 Pain in right shoulder: Secondary | ICD-10-CM

## 2016-05-09 DIAGNOSIS — G8929 Other chronic pain: Secondary | ICD-10-CM

## 2016-05-09 DIAGNOSIS — M6281 Muscle weakness (generalized): Secondary | ICD-10-CM

## 2016-05-09 DIAGNOSIS — R2681 Unsteadiness on feet: Secondary | ICD-10-CM

## 2016-05-09 NOTE — Therapy (Addendum)
[facility] [address]  [place], [place], [address] Phone: [phone]   Fax:  [phone]  Physical Therapy Treatment  Patient Details  Name: [name] MRN: [id number] Date of Birth: Jul 21, 1941 Referring Provider: Dr. Cathlean Cower  Encounter Date: 05/09/2016      PT End of Session - 05/09/16 0955    Visit Number 4   Number of Visits 16   Date for PT Re-Evaluation 06/18/16   PT Start Time 0847   PT Stop Time 0932   PT Time Calculation (min) 45 min   Activity Tolerance Patient tolerated treatment well;Treatment limited secondary to medical complications (Comment)  BP and O2 monitored throughout   Behavior During Therapy [facility] for tasks assessed/performed      Past Medical History:  Diagnosis Date  . BENIGN PROSTATIC HYPERTROPHY 11/23/2009  . C O P D 07/30/2009  . CHRONIC OBSTRUCTIVE PULMONARY DISEASE, ACUTE EXACERBATION 03/01/2010  . CORONARY ARTERY DISEASE 11/23/2009  . 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 DEGENERATIVE JOINT DISEASE 11/23/2009  . DEPRESSION 11/23/2009  . DIABETES MELLITUS, TYPE II 11/23/2009  . DYSPNEA/SHORTNESS OF BREATH 12/08/2009  . FATIGUE 11/23/2009  . GAIT DISTURBANCE 12/10/2009  . HEMOPTYSIS UNSPECIFIED 05/07/2010  . HYPERTENSION 07/30/2009  . HYPOTHYROIDISM 07/30/2009  . 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 PTSD (post-traumatic stress disorder) 03/10/2012  . 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 RA (rheumatoid arthritis) ([place]) 06/11/2011  . RESPIRATORY FAILURE, CHRONIC 07/31/2009  . Scleritis of both eyes 03/17/2014  . THRUSH 11/06/2009  . 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    Past Surgical History:  Procedure Laterality Date  . ABDOMINAL AORTIC ANEURYSM REPAIR    . hx of remote ileum resection due to bleeding    . s/p back surgury   1972   lumbar laminectomy Dr. Collie Siad  . s/p left femoral embolectomy with left leg ischemia     Dr. Kellie Simmering, vascular  . s/p right ganglion cyst     Dr. Daylene Katayama  .  small bowel obstruction repair with adhesiolysis      Vitals:   05/09/16 0855  BP: (!) 164/88        Subjective Assessment - 05/09/16 0855    Subjective Pt. reporting he did have one dizzy spell since yesterday while bending over to pick up something off floor.     Patient Stated Goals "get better"  wants to "move my joints"  improve balance        Today's treatment:   Therex: * Pre-warmup vitals: 164/88 mmHg, 91% O2 sat NuStep: lvl 3, 4:00 min vitals: (BP 170/92 mmHg, 93% O2 sat), 6 total min Seated alternating hip flexion with blue TB x 15 reps Seated alternating (slow) side step (hip abduction, flexion) x 15 reps Standing holding onto treadmill:        Alternating hip abduction 2 x 15 reps        Alternating hip flexion 2 x 15 reps        Alternating hip extension 2 x 15 reps  Alternating rubber ball toe-touch on 8" step x 10 reps each side; close CGA from therapist   * Post tx vitals: 174/92 mmHg, 91% O2 sat,         PT Short Term Goals - 05/07/16 5625      PT SHORT TERM GOAL #1   Title independent with initial HEP (05/21/16)   Status On-going     PT SHORT TERM GOAL #2   Title perform 6 MWT  with appropriate goal to be written (05/21/16)   Status Achieved           PT Long Term Goals - 05/07/16 0943      PT LONG TERM GOAL #1   Title verbalize understanding of fall prevention strategies (06/18/16)   Status On-going     PT LONG TERM GOAL #2   Title improve BERG balance score to >/= 49/56 for improved balance (06/18/16)   Status On-going     PT LONG TERM GOAL #3   Title improve timed up and go to < 9 sec for improved function and mobility (06/18/16)   Status On-going     PT LONG TERM GOAL #4   Title improve R shoulder strength to at least 4/5 for improved strength and function (06/18/16)   Status On-going     PT LONG TERM GOAL #5   Title amb at least 1180' in 6MWT for improved endurance and functional mobility (06/18/16)   Time [place] - 05/09/16 0959    Clinical Impression Statement Pt. reporting better compliance with HEP since discussion with PT last treatment.  Pt. tolerated treatment well today however still requiring increased rest times between activities due to increased BP.  Pt. on 3 L O2 throughout treatment today with O2 sats remaining >90%.  Majority of hip strengthening activity performed in standing today with pt. tolerating mild progression well.     PT Treatment/Interventions ADLs/Self Care Home Management;Cryotherapy;Electrical Stimulation;Iontophoresis 52m/ml Dexamethasone;Moist Heat;Ultrasound;Neuromuscular re-education;Balance training;Therapeutic exercise;Therapeutic activities;Functional mobility training;Gait training;Patient/family education;Manual techniques;Dry needling;Taping;Vasopneumatic Device;Vestibular   PT Next Visit Plan continue strengthening, balance, endurance; monitor vitals      Patient will benefit from skilled therapeutic intervention in order to improve the following deficits and impairments:  Pain, Decreased activity tolerance, Cardiopulmonary status limiting activity, Decreased strength, Difficulty walking, Decreased mobility, Decreased balance, Impaired perceived functional ability, Postural dysfunction  Visit Diagnosis: Muscle weakness (generalized)  Chronic right shoulder pain  Unsteadiness on feet      Late Entry G [place]:   [place] [facility]  Functional Assessment Tool Used FOTO 51% limited  Functional Limitation Mobility: Walking and moving around     Mobility: Walking and Moving Around Goal Status ([phone] CJ  Mobility: Walking and Moving Around Discharge Status ([phone] CK    SLaureen Abrahams PT, DPT 06/13/16 10:15 AM    Problem List Patient Active Problem List   Diagnosis Date Noted  . Polyarthralgia 04/04/2016  . H/O stem cell  transplant ([place] 11/13/2015  . [place] of foot 10/02/2015  . Post-nasal drip 12/11/2014  . COPD, severe ([place] 08/04/2014  . Dysuria 05/04/2014  . Scleritis of both eyes 03/17/2014  . Hyperlipidemia 01/05/2014  . Ileus ([place] 08/22/2013  . Community acquired pneumonia 07/19/2013  . COPD exacerbation ([place] 05/31/2012  . Obesity 04/20/2012  . PTSD (post-traumatic stress disorder) 03/10/2012  . Orthostatic hypotension 02/02/2012  . Syncope 01/26/2012  . RA (rheumatoid arthritis) ([facility] 06/11/2011  . OSA on CPAP 06/10/2011  . LBBB (left bundle branch block) 03/25/2011  . Diastolic dysfunction 027/51/7001 . Acute and chronic respiratory failure 02/21/2011  . Insomnia 12/05/2010  . Erectile dysfunction 12/05/2010  . Preventative health care 12/04/2010  . 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 GAIT DISTURBANCE 12/10/2009  . Diabetes ([place] 11/23/2009  .  Depression 11/23/2009  . Coronary atherosclerosis 11/23/2009  . BENIGN PROSTATIC HYPERTROPHY 11/23/2009  . DEGENERATIVE JOINT DISEASE 11/23/2009  . FATIGUE 11/23/2009  . 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 RESPIRATORY FAILURE, CHRONIC 07/31/2009  . HYPOTHYROIDISM 07/30/2009  . Essential hypertension 07/30/2009  . Mila [place] 07/30/2009    Bess Harvest, PTA 05/09/2016, 10:10 AM  [facility] [address]  [place], [place], [address] Phone: [phone]   Fax:  [phone]  Name: YASSIR ENIS MRN: [id number] Date of Birth: 23-Feb-1941       PHYSICAL THERAPY DISCHARGE SUMMARY  Visits from Start of Care: 4  Current functional level related to goals / functional outcomes: See above; pt requested d/c   Remaining deficits: See above   Education / Equipment: HEP  Plan: Patient agrees to discharge.  Patient goals were not met. Patient is being discharged due to the patient's request.  ?????     Laureen Abrahams, PT,  DPT 06/13/16 10:13 AM  [facility] [place], [place] [address]  [phone] (office) [phone] (fax)

## 2016-05-12 ENCOUNTER — Telehealth: Payer: Self-pay | Admitting: Internal Medicine

## 2016-05-12 NOTE — Telephone Encounter (Signed)
lmtcb x1 for pt. 

## 2016-05-13 NOTE — Telephone Encounter (Signed)
Yes that is fine to switch to leflunoide from lung stand point - we will monitor lungs

## 2016-05-13 NOTE — Telephone Encounter (Signed)
Spoke with pt. He states that he went to see his eye doctor recently and there were some issues found with his left eye. The eye doctor is thinking that it's coming from Plaquenil. This doctor is wanting to switch pt to Leflunomide 10mg  daily instead of Plaquenil. Pt wanted to make sure that MR is okay with that change.  MR - please advise. Thanks.

## 2016-05-13 NOTE — Telephone Encounter (Signed)
lmomtcb x1 

## 2016-05-13 NOTE — Telephone Encounter (Signed)
Pt returning call.[name] ° °

## 2016-05-13 NOTE — Telephone Encounter (Signed)
Pt aware that MR is okay with his medications being switched. Nothing further needed.

## 2016-05-15 ENCOUNTER — Ambulatory Visit (INDEPENDENT_AMBULATORY_CARE_PROVIDER_SITE_OTHER): Payer: Medicare Other | Admitting: Sports Medicine

## 2016-05-15 DIAGNOSIS — M25512 Pain in left shoulder: Secondary | ICD-10-CM

## 2016-05-15 DIAGNOSIS — M25511 Pain in right shoulder: Secondary | ICD-10-CM | POA: Diagnosis not present

## 2016-05-20 ENCOUNTER — Ambulatory Visit (INDEPENDENT_AMBULATORY_CARE_PROVIDER_SITE_OTHER): Payer: Medicare Other | Admitting: Internal Medicine

## 2016-05-20 ENCOUNTER — Encounter: Payer: Self-pay | Admitting: Internal Medicine

## 2016-05-20 VITALS — BP 148/76 | HR 73 | Ht 75.0 in | Wt 238.0 lb

## 2016-05-20 DIAGNOSIS — Z9484 Stem cells transplant status: Secondary | ICD-10-CM | POA: Diagnosis not present

## 2016-05-20 DIAGNOSIS — J449 Chronic obstructive pulmonary disease, unspecified: Secondary | ICD-10-CM

## 2016-05-20 DIAGNOSIS — J9611 Chronic respiratory failure with hypoxia: Secondary | ICD-10-CM | POA: Diagnosis not present

## 2016-05-20 NOTE — Progress Notes (Signed)
Subjective:     Patient ID: [name], male   DOB: [birth year], 75 y.o.   MRN: [id number]  HPI   OV 05/20/2016  Chief Complaint  Patient presents with  . Follow-up    Pt saw TP on 8.7.17 for an acute visit. Pt states he is feeling better. Pt states his SOB is doing well, at baseline. Pt denies hemoptysis, cough, CP/tightness, f/c/s.     75 year old male with severe COPD and chronic hypoxemic respiratory failure. Status post Transplant [employer]. Overall COPD Stable. In August 2017 COPD Exacerbation Treated with Augmentin . Currently Feeling Fine.he continues to use oxygen therapy and Spiriva and Asmanex. He is not going to the gym as much because his wife is even more frail than him with COPD and his arthritis has flared up. Seems to have lost some muscle mass with overall he is doing well. He is up-to-date with his flu shot He denies any active complaints.   has a past medical history of BENIGN PROSTATIC HYPERTROPHY (11/23/2009); C O P D (07/30/2009); CHRONIC OBSTRUCTIVE PULMONARY DISEASE, ACUTE EXACERBATION (03/01/2010); CORONARY ARTERY DISEASE (11/23/2009); DECREASED HEARING, LEFT EAR (03/01/2010); DEGENERATIVE JOINT DISEASE (11/23/2009); DEPRESSION (11/23/2009); DIABETES MELLITUS, TYPE II (11/23/2009); DYSPNEA/SHORTNESS OF BREATH (12/08/2009); FATIGUE (11/23/2009); GAIT DISTURBANCE (12/10/2009); HEMOPTYSIS UNSPECIFIED (05/07/2010); HYPERTENSION (07/30/2009); HYPOTHYROIDISM (07/30/2009); LUMBAR RADICULOPATHY, RIGHT (06/05/2010); PTSD (post-traumatic stress disorder) (03/10/2012); PULMONARY FIBROSIS (06/18/2010); RA (rheumatoid arthritis) (HCC) (06/11/2011); RESPIRATORY FAILURE, CHRONIC (07/31/2009); Scleritis of both eyes (03/17/2014); THRUSH (11/06/2009); and TREMOR (11/23/2009).   reports that he quit smoking about 17 years ago. His smoking use included Cigarettes, Pipe, and Cigars. He has a 100.00 pack-year smoking history. He has never used smokeless tobacco.  Past Surgical History:  Procedure Laterality  Date  . ABDOMINAL AORTIC ANEURYSM REPAIR    . hx of remote ileum resection due to bleeding    . s/p back surgury   1972   lumbar laminectomy Dr. Fannie Knee  . s/p left femoral embolectomy with left leg ischemia     Dr. Hart [place], vascular  . s/p right ganglion cyst     Dr. Teressa Senter  . small bowel obstruction repair with adhesiolysis      No Known Allergies  Immunization History  Administered Date(s) Administered  . Influenza Split 04/14/2011, 04/27/2013  . Influenza Whole 05/28/2009, 04/27/2010, 03/31/2012  . Influenza, High Dose Seasonal PF 03/21/2015  . Influenza,inj,Quad PF,36+ Mos 03/17/2014, 04/04/2016  . Pneumococcal Conjugate-13 09/20/2013  . Pneumococcal Polysaccharide-23 05/28/2008  . Pneumococcal-Unspecified 04/27/2013  . Td 11/23/2009  . Zoster 04/28/2009    Family History  Problem Relation Age of Onset  . Other Mother     gun shot     Current Outpatient Prescriptions:  .  albuterol (PROAIR HFA) 108 (90 BASE) MCG/ACT inhaler, Inhale 2 puffs into the lungs every 4 (four) hours as needed. Shortness of breath, Disp: 1 Inhaler, Rfl: 6 .  albuterol (PROVENTIL) (2.5 MG/3ML) 0.083% nebulizer solution, 1 vial in nebulizer every 6 hours as needed Dx 496, Disp: 120 mL, Rfl: 6 .  ALPRAZolam (XANAX) 0.5 MG tablet, Take 0.5 mg by mouth at bedtime. For sleep and anxiety, Disp: , Rfl:  .  atorvastatin (LIPITOR) 40 MG tablet, Take 10 mg by mouth daily., Disp: , Rfl:  .  fluticasone (FLONASE) 50 MCG/ACT nasal spray, Place 2 sprays into both nostrils daily. (Patient taking differently: Place 2 sprays into both nostrils daily as needed. ), Disp: 16 g, Rfl: 2 .  gabapentin (NEURONTIN) 800 MG tablet, Take 800 mg by mouth  2 (two) times daily.  , Disp: , Rfl:  .  insulin glargine (LANTUS) 100 UNIT/ML injection, Inject 60 Units into the skin at bedtime. , Disp: , Rfl:  .  leflunomide (ARAVA) 10 MG tablet, TK 1 T PO D FOR 3 DAYS. THEN INCREASE TO 2 T PO QD, Disp: , Rfl: 2 .  Levothyroxine Sodium  (SYNTHROID PO), Take 0.2 mg by mouth every morning., Disp: , Rfl:  .  Liraglutide (VICTOZA) 18 MG/3ML SOPN, 1.2 daily, Disp: , Rfl:  .  losartan (COZAAR) 50 MG tablet, Take 1 tablet (50 mg total) by mouth daily., Disp: 30 tablet, Rfl: 0 .  metFORMIN (GLUCOPHAGE) 1000 MG tablet, Take 1,000 mg by mouth 2 (two) times daily with a meal., Disp: , Rfl:  .  mometasone (ASMANEX 60 METERED DOSES) 220 MCG/INH inhaler, Inhale 2 puffs into the lungs daily.  , Disp: , Rfl:  .  Multiple Vitamins-Minerals (MEGA MULTIVITAMIN FOR MEN PO), Take by mouth daily.  , Disp: , Rfl:  .  Olodaterol HCl (STRIVERDI RESPIMAT) 2.5 MCG/ACT AERS, Inhale 2.5 Inhalers into the lungs 1 day or 1 dose. 2 inhalations once a day, Disp: , Rfl:  .  sertraline (ZOLOFT) 100 MG tablet, 100 mg. Take 1/2 by mouth daily as needed, Disp: , Rfl:  .  Tamsulosin HCl (FLOMAX) 0.4 MG CAPS, Take by mouth at bedtime.  , Disp: , Rfl:  .  tiotropium (SPIRIVA HANDIHALER) 18 MCG inhalation capsule, Place 18 mcg into inhaler and inhale daily.  , Disp: , Rfl:     Review of Systems     Objective:   Physical Exam  Constitutional: He is oriented to person, place, and time. He appears well-developed and well-nourished. No distress.  Look well overall  HENT:  Head: Normocephalic and atraumatic.  Right Ear: External ear normal.  Left Ear: External ear normal.  Mouth/Throat: Oropharynx is clear and moist. No oropharyngeal exudate.  o2 on Has beard - first time  Eyes: Conjunctivae and EOM are normal. Pupils are equal, round, and reactive to light. Right eye exhibits no discharge. Left eye exhibits no discharge. No scleral icterus.  Neck: Normal range of motion. Neck supple. No JVD present. No tracheal deviation present. No thyromegaly present.  Cardiovascular: Normal rate, regular rhythm and intact distal pulses.  Exam reveals no gallop and no friction rub.   No murmur heard. Pulmonary/Chest: Effort normal and breath sounds normal. No respiratory  distress. He has no wheezes. He has no rales. He exhibits no tenderness.  Abdominal: Soft. Bowel sounds are normal. He exhibits no distension and no mass. There is no tenderness. There is no rebound and no guarding.  Musculoskeletal: Normal range of motion. He exhibits no edema or tenderness.  Lost muscle mass - due to lack of going to gym Using cane - first time - arthritis flare   Lymphadenopathy:    He has no cervical adenopathy.  Neurological: He is alert and oriented to person, place, and time. He has normal reflexes. No cranial nerve deficit. Coordination normal.  Skin: Skin is warm and dry. No rash noted. He is not diaphoretic. No erythema. No pallor.  Psychiatric: He has a normal mood and affect. His behavior is normal. Judgment and thought content normal.  Nursing note and vitals reviewed.   Vitals:   05/20/16 0901  BP: (!) 148/76  Pulse: 73  SpO2: 97%  Weight: 238 lb (108 kg)  Height: 6\' 3"  (1.905 m)  Assessment:       ICD-9-CM ICD-10-CM   1. COPD, severe (HCC) 496 J44.9   2. Chronic respiratory failure with hypoxia (HCC) 518.83 J96.11    799.02    3. H/O stem cell transplant (HCC) V42.82 Z94.84        Plan:      Disease stable Take care of your arthritis  Plan  continue o2 and other copd nebs/inhalers as before  glad you are uptodate with flu shot  Followup  3 months   Dr. Kalman Shan, M.D., [facility].C.P Pulmonary and Critical Care Medicine Staff Physician [facility] Pager: [phone], If no answer or between  15:00h - 7:00h: call [phone]  05/20/2016 9:14 AM

## 2016-05-20 NOTE — Patient Instructions (Addendum)
ICD-9-CM ICD-10-CM   1. COPD, severe (HCC) 496 J44.9   2. Chronic respiratory failure with hypoxia (HCC) 518.83 J96.11    799.02    3. H/O stem cell transplant [facility]) V42.82 Z94.84     Disease stable Take care of your arthritis  Plan  continue o2 and other copd nebs/inhalers as before  glad you are uptodate with flu shot  Followup  3 months

## 2016-05-21 ENCOUNTER — Other Ambulatory Visit (INDEPENDENT_AMBULATORY_CARE_PROVIDER_SITE_OTHER): Payer: Self-pay | Admitting: Specialist

## 2016-05-21 ENCOUNTER — Other Ambulatory Visit: Payer: Self-pay | Admitting: Internal Medicine

## 2016-05-21 DIAGNOSIS — R0982 Postnasal drip: Secondary | ICD-10-CM

## 2016-05-21 DIAGNOSIS — J9611 Chronic respiratory failure with hypoxia: Secondary | ICD-10-CM

## 2016-05-21 DIAGNOSIS — J449 Chronic obstructive pulmonary disease, unspecified: Secondary | ICD-10-CM

## 2016-05-21 NOTE — Telephone Encounter (Signed)
Ok to refill 

## 2016-05-21 NOTE — Telephone Encounter (Signed)
We are not prescribing primidone for this patient, it is not a medication prescribed by our group, more likely is prescribed by his neurologist or primary care MD.

## 2016-05-22 NOTE — Telephone Encounter (Signed)
Noted, called and left vm advising Thanks. 

## 2016-05-23 ENCOUNTER — Telehealth: Payer: Self-pay | Admitting: Emergency Medicine

## 2016-05-23 MED ORDER — TRAMADOL HCL 50 MG PO TABS: 50.0000 mg | ORAL_TABLET | Freq: Three times a day (TID) | ORAL | 2 refills | Status: DC | PRN

## 2016-05-23 NOTE — Telephone Encounter (Signed)
Pt called and states is in still in a lot of pain and wants to know if he can get something for it. He is seeing  rheumatologist but the medicine he put him on isnt working yet. Please advise thanks.

## 2016-05-23 NOTE — Telephone Encounter (Signed)
Ok for:  Tramadol - Done hardcopy to [employer]

## 2016-05-23 NOTE — Telephone Encounter (Signed)
faxed

## 2016-06-12 ENCOUNTER — Ambulatory Visit (INDEPENDENT_AMBULATORY_CARE_PROVIDER_SITE_OTHER): Payer: Medicare Other | Admitting: Sports Medicine

## 2016-06-12 ENCOUNTER — Encounter (INDEPENDENT_AMBULATORY_CARE_PROVIDER_SITE_OTHER): Payer: Self-pay | Admitting: Sports Medicine

## 2016-06-12 VITALS — BP 154/85 | HR 77

## 2016-06-12 DIAGNOSIS — M2550 Pain in unspecified joint: Secondary | ICD-10-CM

## 2016-06-12 DIAGNOSIS — M069 Rheumatoid arthritis, unspecified: Secondary | ICD-10-CM | POA: Diagnosis not present

## 2016-06-12 DIAGNOSIS — M19019 Primary osteoarthritis, unspecified shoulder: Secondary | ICD-10-CM | POA: Diagnosis not present

## 2016-06-12 NOTE — Patient Instructions (Addendum)
Tumeric and Glucosamine/Chondrotin can be beneficial for helping with pain Try both of the above supplements for several weeks  Don't miss doses of your other medications   I am transferring practices as of January 1st  to [facility] at [facility].  This is a great opportunity & I am saddened to be leaving [facility] however & excited for new opportunities. I will continue to be seeing patients at [facility] through the end of December. I am happy to see you at the new location but also am confident that you are in great hands with the excellent providers here at [facility].  We are not currently scheduling patients at the new location at this time but if you look on [facility]'s website a contact information should be available there closer to January. Additionally [email or web address] will have information when it becomes available.    The telephone number will be [phone]  - Nobody will be answering this phone number until closer to January.

## 2016-06-12 NOTE — Progress Notes (Signed)
[name] - 75 y.o. male MRN [id number]  Date of birth: [birth year]  Office Visit Note: Visit Date: 06/12/2016 PCP: [name], MD Referred by: Corwin Levins, MD  Subjective: Chief Complaint  Patient presents with  . Right Shoulder - Follow-up  . Left Shoulder - Follow-up  . Follow-up    Patient states both shoulders are terrible.  Hard to tell if injections helped.   HPI: Patient reports having general polyarthralgia complaints. Currently being followed by rheumatology. Bilateral shoulder ac joint injections provided some improvement in his symptoms but he is still having moderate discomfort & inability to the left above shoulder level with both shoulders but right is worse than left. Denies any pain radiating down past the elbows. He is continued remain active & is working out in [employer] but is avoiding any type of overhead motion. Biceps curls seem to be most symptomatic for him. ROS: Otherwise per HPI.   Clinical History: No specialty comments available.  He reports that he quit smoking about 17 years ago. His smoking use included Cigarettes, Pipe, and Cigars. He has a 100.00 pack-year smoking history. He has never used smokeless tobacco.   Recent Labs  10/02/15 0942 04/04/16 1047  HGBA1C 7.8* 8.1*    Assessment & Plan: Visit Diagnoses:  1. Polyarthralgia   2. Rheumatoid arthritis involving multiple sites, unspecified rheumatoid factor presence (HCC)   3. Shoulder arthritis    Plan:  Long discussion today regarding options. Ultimately repeat injections are not warranted at this time he would like to hold off unless he is severely symptomatic. We did discuss that safe alternative treatments for an anti-inflammatory standpoint include Tumeric as well as glucosamine/chondroitin although the efficacy of these is variable per individual. Patient is going to follow-up with rheumatology in the next several weeks & discuss possible medication titrations. He number steroid  she has been on have driven up his hemoglobin A1c iand he reports most recently it was 8.6 but I do not have these records available at this time. Once again I'm happy to see him back for any acute flareups will defer to rheumatology for further medication management. We did discuss continuing to remain active & discuss some exercise modifications to see if we can help prevent any acute flareups. Patient voices understanding & once again will follow-up as below.  Follow-up: Return if symptoms worsen or fail to improve.  Meds: No orders of the defined types were placed in this encounter.  Procedures: No notes on file   Objective:  VS:  HT:    WT:   BMI:     BP:(!) 154/85  HR:77bpm  TEMP: ( )  RESP:  Physical Exam: Adult male. No acute distress. Alert & appropriate. Bilateral shoulders: Overall well aligned. Moderate TTP over the anterior shoulder less painful than in the past. Positive crepitation with axial loading & circumduction. Rotator cuff strength does appear to be intact. He has some pain with speeds testing. Radial pulses 2+/4. Sensation is intact to light touch. No pain with arm squeeze test or brachial plexus squeeze. Imaging: No results found.   Past Medical/Family/Surgical/Social History: Medications & Allergies reviewed per EMR Patient Active Problem List   Diagnosis Date Noted  . Shoulder arthritis 06/12/2016  . Polyarthralgia 04/04/2016  . 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 Corn of foot 10/02/2015  . Post-nasal drip 12/11/2014  . COPD, severe (HCC) 08/04/2014  . Dysuria 05/04/2014  . Scleritis of both eyes 03/17/2014  . Hyperlipidemia 01/05/2014  .  Ileus (HCC) 08/22/2013  . Community acquired pneumonia 07/19/2013  . COPD exacerbation (HCC) 05/31/2012  . Obesity 04/20/2012  . PTSD (post-traumatic stress disorder) 03/10/2012  . Orthostatic hypotension 02/02/2012  . Syncope 01/26/2012  . RA (rheumatoid arthritis) (HCC) 06/11/2011  . OSA on CPAP 06/10/2011   . LBBB (left bundle branch block) 03/25/2011  . Diastolic dysfunction 02/25/2011  . Acute and chronic respiratory failure 02/21/2011  . Insomnia 12/05/2010  . Erectile dysfunction 12/05/2010  . Preventative health care 12/04/2010  . 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 GAIT DISTURBANCE 12/10/2009  . Diabetes (HCC) 11/23/2009  . Depression 11/23/2009  . Coronary atherosclerosis 11/23/2009  . BENIGN PROSTATIC HYPERTROPHY 11/23/2009  . DEGENERATIVE JOINT DISEASE 11/23/2009  . FATIGUE 11/23/2009  . 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 RESPIRATORY FAILURE, CHRONIC 07/31/2009  . HYPOTHYROIDISM 07/30/2009  . Essential hypertension 07/30/2009  . C O P D 07/30/2009   Past Medical History:  Diagnosis Date  . BENIGN PROSTATIC HYPERTROPHY 11/23/2009  . C O P D 07/30/2009  . CHRONIC OBSTRUCTIVE PULMONARY DISEASE, ACUTE EXACERBATION 03/01/2010  . CORONARY ARTERY DISEASE 11/23/2009  . 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 DEGENERATIVE JOINT DISEASE 11/23/2009  . DEPRESSION 11/23/2009  . DIABETES MELLITUS, TYPE II 11/23/2009  . DYSPNEA/SHORTNESS OF BREATH 12/08/2009  . FATIGUE 11/23/2009  . GAIT DISTURBANCE 12/10/2009  . HEMOPTYSIS UNSPECIFIED 05/07/2010  . HYPERTENSION 07/30/2009  . HYPOTHYROIDISM 07/30/2009  . 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 PTSD (post-traumatic stress disorder) 03/10/2012  . 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 RA (rheumatoid arthritis) (HCC) 06/11/2011  . RESPIRATORY FAILURE, CHRONIC 07/31/2009  . Scleritis of both eyes 03/17/2014  . THRUSH 11/06/2009  . 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   Family History  Problem Relation Age of Onset  . Other Mother     gun shot   Past Surgical History:  Procedure Laterality Date  . ABDOMINAL AORTIC ANEURYSM REPAIR    . hx of remote ileum resection due to bleeding    . s/p back surgury   1972   lumbar laminectomy Dr. Fannie Knee  . s/p left femoral embolectomy with left leg ischemia     Dr. Hart Rochester, vascular  . s/p right  ganglion cyst     Dr. Teressa Senter  . small bowel obstruction repair with adhesiolysis     Social History   Occupational History  . disabled veteran, Ex Human resources officer Retired   Social History Main Topics  . Smoking status: Former Smoker    Packs/day: 2.50    Years: 40.00    Types: Cigarettes, Pipe, Cigars    Quit date: 07/28/1998  . Smokeless tobacco: Never Used     Comment: quit in 2000 x 40 yrs. 2.5 ppd  . Alcohol use No  . Drug use: No  . Sexual activity: Not on file

## 2016-06-13 DIAGNOSIS — M6281 Muscle weakness (generalized): Secondary | ICD-10-CM | POA: Diagnosis not present

## 2016-07-04 ENCOUNTER — Encounter: Payer: Self-pay | Admitting: Internal Medicine

## 2016-07-10 ENCOUNTER — Ambulatory Visit (INDEPENDENT_AMBULATORY_CARE_PROVIDER_SITE_OTHER): Payer: Medicare Other | Admitting: Internal Medicine

## 2016-07-10 ENCOUNTER — Other Ambulatory Visit (INDEPENDENT_AMBULATORY_CARE_PROVIDER_SITE_OTHER): Payer: Medicare Other

## 2016-07-10 ENCOUNTER — Encounter: Payer: Self-pay | Admitting: Internal Medicine

## 2016-07-10 VITALS — BP 128/82 | HR 100 | Resp 20 | Wt 234.0 lb

## 2016-07-10 DIAGNOSIS — R3915 Urgency of urination: Secondary | ICD-10-CM

## 2016-07-10 DIAGNOSIS — R55 Syncope and collapse: Secondary | ICD-10-CM | POA: Diagnosis not present

## 2016-07-10 DIAGNOSIS — J449 Chronic obstructive pulmonary disease, unspecified: Secondary | ICD-10-CM | POA: Diagnosis not present

## 2016-07-10 LAB — URINALYSIS, ROUTINE W REFLEX MICROSCOPIC
BILIRUBIN URINE: NEGATIVE
HGB URINE DIPSTICK: NEGATIVE
Ketones, ur: NEGATIVE
LEUKOCYTES UA: NEGATIVE
Nitrite: NEGATIVE
Specific Gravity, Urine: 1.025 (ref 1.000–1.030)
Total Protein, Urine: NEGATIVE
URINE GLUCOSE: NEGATIVE
UROBILINOGEN UA: 0.2 (ref 0.0–1.0)
pH: 5.5 (ref 5.0–8.0)

## 2016-07-10 NOTE — Assessment & Plan Note (Signed)
Etiology unclear, for trial flomax qhs, urine studies,  to f/u any worsening symptoms or concerns

## 2016-07-10 NOTE — Assessment & Plan Note (Signed)
Etiology unclear, suspect vagal vs other, doubt neuro related, no CP or assoc symptoms, ecg revewed, recent albs neg for acute, for f/u labs, echo, and carotid dopplers, consdier card. Neuro referrals  Note:  Total time for pt hx, exam, review of record with pt in the room, determination of diagnoses and plan for further eval and tx is > 40 min, with over 50% spent in coordination and counseling of patient

## 2016-07-10 NOTE — Patient Instructions (Signed)
Your EKG was OK today  Please continue all other medications as before, and refills have been done if requested.  Please have the pharmacy call with any other refills you may need.  Please keep your appointments with your specialists as you may have planned  You will be contacted regarding the referral for: Echocardiogram, and Carotid Artery testing  Please go to the LAB in the Basement (turn left off the elevator) for the tests to be done today  You will be contacted by phone if any changes need to be made immediately.  Otherwise, you will receive a letter about your results with an explanation, but please check with MyChart first.  Please remember to sign up for MyChart if you have not done so, as this will be important to you in the future with finding out test results, communicating by private email, and scheduling acute appointments online when needed.  If you have Medicare related insurance (such as traditoinal Medicare, [employer] or [employer], or similar), Please make an appointment at the Scheduling desk with Selena Batten, the [employer], for your Wellness Visit in this office, which is a benefit with your insurance.

## 2016-07-10 NOTE — Progress Notes (Signed)
Subjective:    Patient ID: [name], male    DOB: Sep 21, 1940, 75 y.o.   MRN: [id number]  HPI  1 wk ago had episode of syncope x 1 wk ago, became dizzy after carrying boxes from one to the other for wife, does tend to have head position change dizziness as well but this was different, had to sit down, but still passed out.  Has a prn diuretic but has not used recently.  No assoc symptoms like CP, worsening SOB, but did have some nausea but no vomiting.  No diaphoresis but not flushed, no heart racing.  Seemed to have fallen out of the chair b/c found himself on floor with soreness of right head but no swelling, also some right shoulder soreness.  Only out for seconds to minutes but not sure, wife didn't hear or witness the episode as she was in another room.  No hx of siezure. Sees Dr Ross/card, no recent echo.  Cont's on prednisone per rheuamtology, has f/u appt next Thursday.  Also seen at [facility] recently with ok labs per pt except for elevated BS he believes due to prednisone.  Takes 4 bottles of water per day plus other fluids,, does not think he could be dehdyratedNo overt bleeding recently, no anticoagulatnt use, and not taking aspirin as well bc an MD at some point told him not to, not sure why.  Had other episode x 1 about 1 yrs ago with syncope, hit lip on hard counter, drew blood at the time.  No hx of siezure.  Does have occasional urinary urgency despite current prostate meds, has seen urology with neg exam for acute, and Denies urinary symptoms such as dysuria, frequency, flank pain, hematuria or fever, chills. Past Medical History:  Diagnosis Date  . BENIGN PROSTATIC HYPERTROPHY 11/23/2009  . C O P D 07/30/2009  . CHRONIC OBSTRUCTIVE PULMONARY DISEASE, ACUTE EXACERBATION 03/01/2010  . CORONARY ARTERY DISEASE 11/23/2009  . 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 DEGENERATIVE JOINT DISEASE 11/23/2009  . DEPRESSION 11/23/2009  . DIABETES MELLITUS, TYPE II 11/23/2009  . DYSPNEA/SHORTNESS OF  BREATH 12/08/2009  . FATIGUE 11/23/2009  . GAIT DISTURBANCE 12/10/2009  . HEMOPTYSIS UNSPECIFIED 05/07/2010  . HYPERTENSION 07/30/2009  . HYPOTHYROIDISM 07/30/2009  . 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 PTSD (post-traumatic stress disorder) 03/10/2012  . 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 RA (rheumatoid arthritis) (HCC) 06/11/2011  . RESPIRATORY FAILURE, CHRONIC 07/31/2009  . Scleritis of both eyes 03/17/2014  . THRUSH 11/06/2009  . 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   Past Surgical History:  Procedure Laterality Date  . ABDOMINAL AORTIC ANEURYSM REPAIR    . hx of remote ileum resection due to bleeding    . s/p back surgury   1972   lumbar laminectomy Dr. Fannie Knee  . s/p left femoral embolectomy with left leg ischemia     Dr. Hart Rochester, vascular  . s/p right ganglion cyst     Dr. Teressa Senter  . small bowel obstruction repair with adhesiolysis      reports that he quit smoking about 17 years ago. His smoking use included Cigarettes, Pipe, and Cigars. He has a 100.00 pack-year smoking history. He has never used smokeless tobacco. He reports that he does not drink alcohol or use drugs. family history includes Other in his mother. No Known Allergies Current Outpatient Prescriptions on File Prior to Visit  Medication Sig Dispense Refill  . albuterol (PROAIR HFA) 108 (90 BASE) MCG/ACT inhaler Inhale 2 puffs into the lungs every 4 (four)  hours as needed. Shortness of breath 1 Inhaler 6  . albuterol (PROVENTIL) (2.5 MG/3ML) 0.083% nebulizer solution 1 vial in nebulizer every 6 hours as needed Dx 496 120 mL 6  . ALPRAZolam (XANAX) 0.5 MG tablet Take 0.5 mg by mouth at bedtime. For sleep and anxiety    . atorvastatin (LIPITOR) 40 MG tablet Take 10 mg by mouth daily.    . fluticasone (FLONASE) 50 MCG/ACT nasal spray USE 2 SPRAYS IN EACH NOSTRIL DAILY 16 g 2  . gabapentin (NEURONTIN) 800 MG tablet Take 800 mg by mouth 2 (two) times daily.      [name] HYDROcodone-acetaminophen (NORCO/VICODIN) 5-325 MG tablet Take 0.5-1 tablets by  mouth 2 (two) times daily.  0  . insulin glargine (LANTUS) 100 UNIT/ML injection Inject 60 Units into the skin at bedtime.     [name] leflunomide (ARAVA) 10 MG tablet TK 1 T PO D FOR 3 DAYS. THEN INCREASE TO 2 T PO QD  2  . leflunomide (ARAVA) 20 MG tablet Take 1 tablet by mouth daily.    . Levothyroxine Sodium (SYNTHROID PO) Take 0.2 mg by mouth every morning.    . Liraglutide (VICTOZA) 18 MG/3ML SOPN 1.2 daily    . losartan (COZAAR) 50 MG tablet Take 1 tablet (50 mg total) by mouth daily. 30 tablet 0  . metFORMIN (GLUCOPHAGE) 1000 MG tablet Take 1,000 mg by mouth 2 (two) times daily with a meal.    . mometasone (ASMANEX 60 METERED DOSES) 220 MCG/INH inhaler Inhale 2 puffs into the lungs daily.      . Multiple Vitamins-Minerals (MEGA MULTIVITAMIN FOR MEN PO) Take by mouth daily.      . Olodaterol HCl (STRIVERDI RESPIMAT) 2.5 MCG/ACT AERS Inhale 2.5 Inhalers into the lungs 1 day or 1 dose. 2 inhalations once a day    . predniSONE (DELTASONE) 10 MG tablet Take 1 tablet by mouth daily.    . sertraline (ZOLOFT) 100 MG tablet 100 mg. Take 1/2 by mouth daily as needed    . Tamsulosin HCl (FLOMAX) 0.4 MG CAPS Take by mouth at bedtime.      [name] tiotropium (SPIRIVA HANDIHALER) 18 MCG inhalation capsule Place 18 mcg into inhaler and inhale daily.      . traMADol (ULTRAM) 50 MG tablet Take 1 tablet (50 mg total) by mouth every 8 (eight) hours as needed. 90 tablet 2   No current facility-administered medications on file prior to visit.    Review of Systems  Constitutional: Negative for unusual diaphoresis or night sweats HENT: Negative for ear swelling or discharge Eyes: Negative for worsening visual haziness  Respiratory: Negative for choking and stridor.   Gastrointestinal: Negative for distension or worsening eructation Genitourinary: Negative for retention or change in urine volume.  Musculoskeletal: Negative for other MSK pain or swelling Skin: Negative for color change and worsening  wound Neurological: Negative for tremors and numbness other than noted  Psychiatric/Behavioral: Negative for decreased concentration or agitation other than above   All other system neg per pt    Objective:   Physical Exam BP 128/82   Pulse 100   Resp 20   Wt 234 lb (106.1 kg)   SpO2 94%   BMI 29.25 kg/m  VS noted,  Constitutional: Pt appears in no apparent distress HENT: Head: NCAT.  Right Ear: External ear normal.  Left Ear: External ear normal.  Eyes: . Pupils are equal, round, and reactive to light. Conjunctivae and EOM are normal Neck: Normal range of motion. Neck  supple.  Cardiovascular: Normal rate and regular rhythm.   Pulmonary/Chest: Effort normal and breath sounds decreased without rales or wheezing.  Abd:  Soft, NT, ND, + BS Neurological: Pt is alert. Not confused , motor 5/5 intact, cn 2-12 intact Skin: Skin is warm. No rash, no LE edema Psychiatric: Pt behavior is normal. No agitation.   Trace LE edema bilat Lab Results  Component Value Date   WBC 7.9 10/02/2015   HGB 12.4 (L) 10/02/2015   HCT 38.1 (L) 10/02/2015   PLT 147.0 (L) 10/02/2015   GLUCOSE 131 (H) 04/04/2016   CHOL 145 04/04/2016   TRIG 173.0 (H) 04/04/2016   HDL 30.20 (L) 04/04/2016   LDLDIRECT 66.0 10/02/2015   LDLCALC 80 04/04/2016   ALT 15 04/04/2016   AST 17 04/04/2016   NA 139 04/04/2016   K 4.7 04/04/2016   CL 101 04/04/2016   CREATININE 0.96 04/04/2016   BUN 14 04/04/2016   CO2 31 04/04/2016   TSH 0.45 10/02/2015   PSA 0.46 10/02/2015   INR 0.94 05/04/2011   HGBA1C 8.1 (H) 04/04/2016   MICROALBUR 4.4 (H) 10/02/2015   ECG I have personally interpreted Sinus  Tachycardia  -Left bundle branch block and left axis.  -no signficant change    Assessment & Plan:

## 2016-07-10 NOTE — Assessment & Plan Note (Signed)
stable overall by history and exam, recent data reviewed with pt, and pt to continue medical treatment as before,  to f/u any worsening symptoms or concerns @LASTSAO2(3)@  

## 2016-07-10 NOTE — Progress Notes (Signed)
Pre visit review using our clinic review tool, if applicable. No additional management support is needed unless otherwise documented below in the visit note. 

## 2016-07-11 ENCOUNTER — Other Ambulatory Visit (INDEPENDENT_AMBULATORY_CARE_PROVIDER_SITE_OTHER): Payer: Medicare Other

## 2016-07-11 DIAGNOSIS — H02421 Myogenic ptosis of right eyelid: Secondary | ICD-10-CM | POA: Insufficient documentation

## 2016-07-11 DIAGNOSIS — Q738 Other reduction defects of unspecified limb(s): Secondary | ICD-10-CM

## 2016-07-11 DIAGNOSIS — Q998 Other specified chromosome abnormalities: Secondary | ICD-10-CM | POA: Diagnosis not present

## 2016-07-11 DIAGNOSIS — H40013 Open angle with borderline findings, low risk, bilateral: Secondary | ICD-10-CM | POA: Insufficient documentation

## 2016-07-11 LAB — CBC WITH DIFFERENTIAL/PLATELET
BASOS ABS: 0.1 10*3/uL (ref 0.0–0.1)
Basophils Relative: 0.7 % (ref 0.0–3.0)
EOS ABS: 0.1 10*3/uL (ref 0.0–0.7)
Eosinophils Relative: 0.7 % (ref 0.0–5.0)
HEMATOCRIT: 36 % — AB (ref 39.0–52.0)
HEMOGLOBIN: 12 g/dL — AB (ref 13.0–17.0)
LYMPHS PCT: 11.5 % — AB (ref 12.0–46.0)
Lymphs Abs: 1 10*3/uL (ref 0.7–4.0)
MCHC: 33.3 g/dL (ref 30.0–36.0)
MCV: 80.4 fl (ref 78.0–100.0)
Monocytes Absolute: 0.6 10*3/uL (ref 0.1–1.0)
Monocytes Relative: 6.6 % (ref 3.0–12.0)
NEUTROS ABS: 7 10*3/uL (ref 1.4–7.7)
Neutrophils Relative %: 80.5 % — ABNORMAL HIGH (ref 43.0–77.0)
PLATELETS: 164 10*3/uL (ref 150.0–400.0)
RBC: 4.48 Mil/uL (ref 4.22–5.81)
RDW: 16.3 % — ABNORMAL HIGH (ref 11.5–15.5)
WBC: 8.7 10*3/uL (ref 4.0–10.5)

## 2016-07-11 LAB — BASIC METABOLIC PANEL
BUN: 15 mg/dL (ref 6–23)
CHLORIDE: 99 meq/L (ref 96–112)
CO2: 28 meq/L (ref 19–32)
CREATININE: 1.01 mg/dL (ref 0.40–1.50)
Calcium: 9.9 mg/dL (ref 8.4–10.5)
GFR: 76.57 mL/min (ref >60.00)
Glucose, Bld: 232 mg/dL — ABNORMAL HIGH (ref 70–99)
Potassium: 4.7 mEq/L (ref 3.5–5.1)
Sodium: 138 mEq/L (ref 135–145)

## 2016-07-11 LAB — HEPATIC FUNCTION PANEL
ALT: 22 U/L (ref 0–53)
AST: 21 U/L (ref 0–37)
Albumin: 4.3 g/dL (ref 3.5–5.2)
Alkaline Phosphatase: 53 U/L (ref 39–117)
Bilirubin, Direct: 0.1 mg/dL (ref 0.0–0.3)
TOTAL PROTEIN: 6.8 g/dL (ref 6.0–8.3)
Total Bilirubin: 0.5 mg/dL (ref 0.2–1.2)

## 2016-07-11 LAB — HEMOGLOBIN A1C: Hgb A1c MFr Bld: 9 % — ABNORMAL HIGH (ref 4.6–6.5)

## 2016-07-11 LAB — URINE CULTURE: ORGANISM ID, BACTERIA: NO GROWTH

## 2016-07-17 NOTE — Progress Notes (Unsigned)
left

## 2016-08-25 ENCOUNTER — Ambulatory Visit (HOSPITAL_COMMUNITY)
Admission: RE | Admit: 2016-08-25 | Discharge: 2016-08-25 | Disposition: A | Discharge status: Home / Self Care | Payer: Medicare Other | Source: Ambulatory Visit | Attending: Cardiovascular Disease | Admitting: Cardiovascular Disease

## 2016-08-25 DIAGNOSIS — E119 Type 2 diabetes mellitus without complications: Secondary | ICD-10-CM | POA: Insufficient documentation

## 2016-08-25 DIAGNOSIS — I2510 Atherosclerotic heart disease of native coronary artery without angina pectoris: Secondary | ICD-10-CM | POA: Insufficient documentation

## 2016-08-25 DIAGNOSIS — I6523 Occlusion and stenosis of bilateral carotid arteries: Secondary | ICD-10-CM

## 2016-08-25 DIAGNOSIS — R55 Syncope and collapse: Secondary | ICD-10-CM | POA: Diagnosis not present

## 2016-08-25 DIAGNOSIS — I10 Essential (primary) hypertension: Secondary | ICD-10-CM | POA: Diagnosis not present

## 2016-08-25 DIAGNOSIS — Z87891 Personal history of nicotine dependence: Secondary | ICD-10-CM | POA: Insufficient documentation

## 2016-08-27 ENCOUNTER — Ambulatory Visit: Payer: Medicare Other | Admitting: Internal Medicine

## 2016-08-28 ENCOUNTER — Ambulatory Visit (HOSPITAL_COMMUNITY): Payer: Medicare Other | Attending: Cardiovascular Disease

## 2016-08-28 ENCOUNTER — Other Ambulatory Visit: Payer: Self-pay

## 2016-08-28 ENCOUNTER — Encounter (HOSPITAL_COMMUNITY): Payer: Self-pay

## 2016-08-28 DIAGNOSIS — E119 Type 2 diabetes mellitus without complications: Secondary | ICD-10-CM | POA: Insufficient documentation

## 2016-08-28 DIAGNOSIS — J449 Chronic obstructive pulmonary disease, unspecified: Secondary | ICD-10-CM | POA: Diagnosis not present

## 2016-08-28 DIAGNOSIS — I429 Cardiomyopathy, unspecified: Secondary | ICD-10-CM | POA: Insufficient documentation

## 2016-08-28 DIAGNOSIS — R55 Syncope and collapse: Secondary | ICD-10-CM | POA: Insufficient documentation

## 2016-08-28 DIAGNOSIS — J8410 Pulmonary fibrosis, unspecified: Secondary | ICD-10-CM | POA: Insufficient documentation

## 2016-08-28 DIAGNOSIS — J9610 Chronic respiratory failure, unspecified whether with hypoxia or hypercapnia: Secondary | ICD-10-CM | POA: Insufficient documentation

## 2016-08-28 DIAGNOSIS — Z87891 Personal history of nicotine dependence: Secondary | ICD-10-CM | POA: Insufficient documentation

## 2016-08-28 DIAGNOSIS — I2510 Atherosclerotic heart disease of native coronary artery without angina pectoris: Secondary | ICD-10-CM | POA: Diagnosis not present

## 2016-08-28 DIAGNOSIS — I10 Essential (primary) hypertension: Secondary | ICD-10-CM | POA: Insufficient documentation

## 2016-08-28 DIAGNOSIS — I447 Left bundle-branch block, unspecified: Secondary | ICD-10-CM | POA: Insufficient documentation

## 2016-08-28 HISTORY — DX: Cardiomyopathy, unspecified: I42.9

## 2016-08-29 ENCOUNTER — Telehealth: Payer: Self-pay | Admitting: Internal Medicine

## 2016-08-29 NOTE — Telephone Encounter (Signed)
enbrel has more immune suppression than methotrexate so increased risk of infections but that is part of the natural risk. If he has lot of pain or RA real ad, then he should balance the beneft and risk. I am ok with him going on enbrel   Dr. Kalman Shan, M.D., [facility].C.P Pulmonary and Critical Care Medicine Staff Physician [place] [facility] [place] [facility] Pager: [phone], If no answer or between  15:00h - 7:00h: call [phone]  08/29/2016 2:35 PM

## 2016-08-29 NOTE — Telephone Encounter (Signed)
Called and spoke with pt and he stated that he has been seeing a rheumatologist at [facility]   [phone] (Dr. Kathlen Mody) and the pt stated that Dr. Kathlen Mody will not prescribe the methotrexate for the pt without first getting the aprroval from MR.  MR please advise. Thanks  No Known Allergies

## 2016-08-29 NOTE — Telephone Encounter (Signed)
Lm with pt's spouse, to have pt return our call.

## 2016-08-29 NOTE — Telephone Encounter (Signed)
Called and spoke with pt and he stated that now after doing some checking with his insurance company he would like to go back on the enbrel.  He wanted to know what MR thought of this?  Please advise. thanks

## 2016-08-29 NOTE — Telephone Encounter (Signed)
Ok for weekly methotrexate and we can monitor. Just that he is aware of risks to lung that clinically we see but not very often  Dr. Kalman Shan, M.D., [facility].C.P Pulmonary and Critical Care Medicine Staff Physician [place] [facility] [place] [facility] Pager: [phone], If no answer or between  15:00h - 7:00h: call [phone]  08/29/2016 10:56 AM

## 2016-09-01 NOTE — Telephone Encounter (Signed)
Called and spoke with pt and he is aware of MR recs. Nothing further is needed.  

## 2016-09-04 ENCOUNTER — Ambulatory Visit (INDEPENDENT_AMBULATORY_CARE_PROVIDER_SITE_OTHER): Payer: Medicare Other | Admitting: Adult Health

## 2016-09-04 ENCOUNTER — Encounter: Payer: Self-pay | Admitting: Adult Health

## 2016-09-04 DIAGNOSIS — J9611 Chronic respiratory failure with hypoxia: Secondary | ICD-10-CM

## 2016-09-04 DIAGNOSIS — J449 Chronic obstructive pulmonary disease, unspecified: Secondary | ICD-10-CM | POA: Diagnosis not present

## 2016-09-04 DIAGNOSIS — I428 Other cardiomyopathies: Secondary | ICD-10-CM

## 2016-09-04 NOTE — Assessment & Plan Note (Signed)
Controlled on O2  

## 2016-09-04 NOTE — Progress Notes (Signed)
@Patient  ID: [name], male    DOB: 1940-12-06, 76 y.o.   MRN: [id number]  Chief Complaint  Patient presents with  . 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  PFT 10/2015 FEV1 was 36%, ratio 46, FVC 58%, DLCO 26%  09/04/2016 Follow up ; COPD/O2 RF  Pt returns for a 4 month follow up for COPD .  He says overall his breathing has been doing okay without flare of cough or dyspnea. Remains on Asmanex and Sprivia .   He does have RA and is on chronic steroids prednisone 10mg  daily . He is getting ready to change from Arava to Enbrel .   Remains on O2 2l/m . Gets winded with long walking . O2 helps.    Says he has been having episodes of syncope for last couple of months.  Underwent an echo  On 2/1 that showed decreased LVSF EF at 35-40%, with severe LBBB related dyssyncrony, mod diffuse hypokinesis . Vent septum motion w/ abn fxn, dyssynergy, and paradox. He is awaiting ov with cardiology but has not heard from their office. If followed by Dr. Tenny Craw.   Pt wants to transfer care to [facility] office as he lives away and is easier to get to.   No Known Allergies  Immunization History  Administered Date(s) Administered  . Influenza Split 04/14/2011, 04/27/2013  . Influenza Whole 05/28/2009, 04/27/2010, 03/31/2012  . Influenza, High Dose Seasonal PF 03/21/2015  . Influenza,inj,Quad PF,36+ Mos 03/17/2014, 04/04/2016  . Pneumococcal Conjugate-13 09/20/2013  . Pneumococcal Polysaccharide-23 05/28/2008  . Pneumococcal-Unspecified 04/27/2013  . Td 11/23/2009  . Zoster 04/28/2009    Past Medical History:  Diagnosis Date  . BENIGN PROSTATIC HYPERTROPHY 11/23/2009  . C O P D  07/30/2009  . Cardiomyopathy (HCC) 08/28/2016  . CHRONIC OBSTRUCTIVE PULMONARY DISEASE, ACUTE EXACERBATION 03/01/2010  . CORONARY ARTERY DISEASE 11/23/2009  . 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 DEGENERATIVE JOINT DISEASE 11/23/2009  . DEPRESSION 11/23/2009  . DIABETES MELLITUS, TYPE II 11/23/2009  . DYSPNEA/SHORTNESS OF BREATH 12/08/2009  . FATIGUE 11/23/2009  . GAIT DISTURBANCE 12/10/2009  . HEMOPTYSIS UNSPECIFIED 05/07/2010  . HYPERTENSION 07/30/2009  . HYPOTHYROIDISM 07/30/2009  . 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 PTSD (post-traumatic stress disorder) 03/10/2012  . 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 RA (rheumatoid arthritis) (HCC) 06/11/2011  . RESPIRATORY FAILURE, CHRONIC 07/31/2009  . Scleritis of both eyes 03/17/2014  . THRUSH 11/06/2009  . 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    Tobacco History: History  Smoking Status  . Former Smoker  . Packs/day: 2.50  . Years: 40.00  . Types: Cigarettes, Pipe, Cigars  . Quit date: 07/28/1998  Smokeless Tobacco  . Never Used    Comment: quit in 2000 x 40 yrs. 2.5 ppd   Counseling given: Not Answered   Outpatient Encounter Prescriptions as of 09/04/2016  Medication Sig  . albuterol (PROAIR HFA) 108 (90 BASE) MCG/ACT inhaler Inhale 2 puffs into the lungs every 4 (four) hours as needed. Shortness of breath  . albuterol (PROVENTIL) (2.5 MG/3ML) 0.083% nebulizer solution 1 vial in nebulizer every 6 hours as needed Dx 496  . ALPRAZolam (XANAX) 0.5 MG tablet Take 0.5 mg by mouth at bedtime. For  sleep and anxiety  . atorvastatin (LIPITOR) 40 MG tablet Take 10 mg by mouth daily.  . fluticasone (FLONASE) 50 MCG/ACT nasal spray USE 2 SPRAYS IN EACH NOSTRIL DAILY  . gabapentin (NEURONTIN) 800 MG tablet Take 800 mg by mouth 2 (two) times daily.    [name] HYDROcodone-acetaminophen (NORCO/VICODIN) 5-325 MG tablet Take 0.5-1 tablets by mouth 2 (two) times daily.  . insulin glargine (LANTUS) 100 UNIT/ML injection Inject 60 Units into the skin at bedtime.   [name] leflunomide (ARAVA) 20  MG tablet Take 1 tablet by mouth daily.  . Levothyroxine Sodium (SYNTHROID PO) Take 0.2 mg by mouth every morning.  . Liraglutide (VICTOZA) 18 MG/3ML SOPN 1.2 daily  . losartan (COZAAR) 50 MG tablet Take 1 tablet (50 mg total) by mouth daily.  . metFORMIN (GLUCOPHAGE) 1000 MG tablet Take 1,000 mg by mouth 2 (two) times daily with a meal.  . mometasone (ASMANEX 60 METERED DOSES) 220 MCG/INH inhaler Inhale 2 puffs into the lungs daily.    . Multiple Vitamins-Minerals (MEGA MULTIVITAMIN FOR MEN PO) Take by mouth daily.    . Olodaterol HCl (STRIVERDI RESPIMAT) 2.5 MCG/ACT AERS Inhale 2.5 Inhalers into the lungs 1 day or 1 dose. 2 inhalations once a day  . predniSONE (DELTASONE) 10 MG tablet Take 1 tablet by mouth daily.  . sertraline (ZOLOFT) 100 MG tablet 100 mg. Take 1/2 by mouth daily as needed  . Tamsulosin HCl (FLOMAX) 0.4 MG CAPS Take by mouth at bedtime.    [name] tiotropium (SPIRIVA HANDIHALER) 18 MCG inhalation capsule Place 18 mcg into inhaler and inhale daily.    . traMADol (ULTRAM) 50 MG tablet Take 1 tablet (50 mg total) by mouth every 8 (eight) hours as needed.  . [DISCONTINUED] leflunomide (ARAVA) 10 MG tablet TK 1 T PO D FOR 3 DAYS. THEN INCREASE TO 2 T PO QD   No facility-administered encounter medications on file as of 09/04/2016.      Review of Systems  Constitutional:   No  weight loss, night sweats,  Fevers, chills, + fatigue, or  lassitude.  HEENT:   No headaches,  Difficulty swallowing,  Tooth/dental problems, or  Sore throat,                No sneezing, itching, ear ache, nasal congestion, post nasal drip,   CV:  No chest pain,  Orthopnea, PND, swelling in lower extremities, anasarca, dizziness, palpitations, syncope.   GI  No heartburn, indigestion, abdominal pain, nausea, vomiting, diarrhea, change in bowel habits, loss of appetite, bloody stools.   Resp:   No excess mucus, no productive cough,  No non-productive cough,  No coughing up of blood.  No change in color of  mucus.  No wheezing.  No chest wall deformity  Skin: no rash or lesions.  GU: no dysuria, change in color of urine, no urgency or frequency.  No flank pain, no hematuria   MS:  No joint pain or swelling.  No decreased range of motion.  No back pain.    Physical Exam  BP 124/68 (BP Location: Left Arm, Cuff Size: Normal)   Pulse 74   Ht 6\' 3"  (1.905 m)   Wt 231 lb (104.8 kg)   SpO2 93%   BMI 28.87 kg/m   GEN: A/Ox3; pleasant , NAD elderly on o2    HEENT:  /AT,  EACs-clear, TMs-wnl, NOSE-clear, THROAT-clear, no lesions, no postnasal drip or exudate noted. Sclera chronic greyish rings.   NECK:  Supple w/ fair ROM;  no JVD; normal carotid impulses w/o bruits; no thyromegaly or nodules palpated; no lymphadenopathy.    RESP  Decreased BS in bases , no accessory muscle use, no dullness to percussion  CARD:  RRR, no m/r/g, no peripheral edema, pulses intact, no cyanosis or clubbing.  GI:   Soft & nt; nml bowel sounds; no organomegaly or masses detected.   Musco: Warm bil, no deformities or joint swelling noted.   Neuro: alert, no focal deficits noted.    Skin: Warm, no lesions or rashes  Psych:  No change in mood or affect. No depression or anxiety.  No memory loss.  Lab Results:  CBC    Component Value Date/Time   WBC 8.7 07/10/2016 1704   RBC 4.48 07/10/2016 1704   HGB 12.0 (L) 07/10/2016 1704   HCT 36.0 (L) 07/10/2016 1704   PLT 164.0 07/10/2016 1704   MCV 80.4 07/10/2016 1704   MCH 26.9 08/24/2013 0530   MCHC 33.3 07/10/2016 1704   RDW 16.3 (H) 07/10/2016 1704   LYMPHSABS 1.0 07/10/2016 1704   MONOABS 0.6 07/10/2016 1704   EOSABS 0.1 07/10/2016 1704   BASOSABS 0.1 07/10/2016 1704    BMET    Component Value Date/Time   NA 138 07/10/2016 1704   K 4.7 07/10/2016 1704   CL 99 07/10/2016 1704   CO2 28 07/10/2016 1704   GLUCOSE 232 (H) 07/10/2016 1704   BUN 15 07/10/2016 1704   CREATININE 1.01 07/10/2016 1704   CALCIUM 9.9 07/10/2016 1704   GFRNONAA 82 (L)  08/24/2013 0530   GFRAA >90 08/24/2013 0530    BNP No results found for: BNP  ProBNP    Component Value Date/Time   PROBNP 59.9 05/04/2011 0517    Imaging: No results found.   Assessment & Plan:   COPD (chronic obstructive pulmonary disease) (HCC) Compensated on present regimen   Plan  Patient Instructions  Continue on Asmanex and Spiriva .  Continue on Oxygen 2l/m .  Follow up with Cardiology as discussed -tomorrow at 09/05/16 at 10:00, be there at 9:45.  follow up with Dr. Vassie Loll in 3 months and As needed       RESPIRATORY FAILURE, CHRONIC Controlled on O2.   Cardiomyopathy (HCC) Newly dx with Cardiomyopathy with recent echo showing decreased LVSF EF at 35-40%, with severe LBBB related dyssyncrony, mod diffuse hypokinesis . Vent septum motion w/ abn fxn, dyssynergy, and paradox.  This has been associated with syncope.  Called cardiology and will see him tomorrow at 10:00am . Appreciate their assitance.      Rubye Oaks, NP 09/04/2016

## 2016-09-04 NOTE — Assessment & Plan Note (Signed)
Compensated on present regimen   Plan  Patient Instructions  Continue on Asmanex and Spiriva .  Continue on Oxygen 2l/m .  Follow up with Cardiology as discussed -tomorrow at 09/05/16 at 10:00, be there at 9:45.  follow up with Dr. Vassie Loll in 3 months and As needed

## 2016-09-04 NOTE — Progress Notes (Signed)
Cardiology Office Note    Date:  09/05/2016   ID:  [name], DOB April 23, 1941, MRN [id number]  PCP:  [name], MD  Cardiologist: Dr. Tenny Craw  Chief Complaint: Cardiomyopathy and syncope  History of Present Illness:   [name] is a 76 y.o. male CAD, chronic LBBB,  COPD and chronic hypoxemic respiratory failurerequiring oxygen (follow by Dr. Marchelle Gearing) , HTN, RA on chronic steroids and recent diagnosis for cardiomyopathy (for syncope evaluation)  presents for further evaluation.   2 heart caths in the past.  One in 2000s showed one ulcerated plaque  Rx medically.   Second at [facility] --> No signif CAD. He was doing well on cardiac stand point when last seen by Dr. Tenny Craw 04/2016.   He has episode of syncope in Dec 2017. He  became dizzy after carrying boxes from one to the other for wife, does tend to have head position change dizziness as well but this was different, had to sit down, but still passed out. No other associated symptoms.   Carotid doppler 08/25/16 showed 1-39% RICA and 40-59% LICA stenosis. Echo showed new depressed EF of 35-40% appears to be primarily secondary to severe LBBB-related dyssynchrony. There may also be regional hypokinesis in the distribution of the right coronary artery.   Here for further evaluation. Previously he did not felt orthostatic. No further episode syncope. He also had one episode in 2015. He feels dizzy with bending down. Intermittent achy/pressure L upper chest pain that radiates to L shoulder or arm. Denies palpations, LE edema, melena or blood in his stool or urine. She sleeps on recliner chronically due to COPD and chronic respiratory failure.   Elevated BP today however he hasn't took his Losartan today.   Past Medical History:  Diagnosis Date  . BENIGN PROSTATIC HYPERTROPHY 11/23/2009  . C O P D 07/30/2009  . Cardiomyopathy (HCC) 08/28/2016  . CHRONIC OBSTRUCTIVE PULMONARY DISEASE, ACUTE EXACERBATION 03/01/2010  . CORONARY ARTERY DISEASE  11/23/2009  . 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 DEGENERATIVE JOINT DISEASE 11/23/2009  . DEPRESSION 11/23/2009  . DIABETES MELLITUS, TYPE II 11/23/2009  . DYSPNEA/SHORTNESS OF BREATH 12/08/2009  . FATIGUE 11/23/2009  . GAIT DISTURBANCE 12/10/2009  . HEMOPTYSIS UNSPECIFIED 05/07/2010  . HYPERTENSION 07/30/2009  . HYPOTHYROIDISM 07/30/2009  . 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 PTSD (post-traumatic stress disorder) 03/10/2012  . 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 RA (rheumatoid arthritis) (HCC) 06/11/2011  . RESPIRATORY FAILURE, CHRONIC 07/31/2009  . Scleritis of both eyes 03/17/2014  . THRUSH 11/06/2009  . 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    Past Surgical History:  Procedure Laterality Date  . ABDOMINAL AORTIC ANEURYSM REPAIR    . hx of remote ileum resection due to bleeding    . s/p back surgury   1972   lumbar laminectomy Dr. Fannie Knee  . s/p left femoral embolectomy with left leg ischemia     Dr. Hart Rochester, vascular  . s/p right ganglion cyst     Dr. Teressa Senter  . small bowel obstruction repair with adhesiolysis      Current Medications:  Prior to Admission medications   Medication Sig Start Date End Date Taking? Authorizing Provider  albuterol (PROAIR HFA) 108 (90 BASE) MCG/ACT inhaler Inhale 2 puffs into the lungs every 4 (four) hours as needed. Shortness of breath 10/09/11  Yes Kalman Shan, MD  albuterol (PROVENTIL) (2.5 MG/3ML) 0.083% nebulizer solution 1 vial in nebulizer every 6 hours as needed Dx 496 05/31/12  Yes Kalman Shan, MD  ALPRAZolam (  XANAX) 0.5 MG tablet Take 0.5 mg by mouth at bedtime. For sleep and anxiety   Yes Historical Provider, MD  atorvastatin (LIPITOR) 40 MG tablet Take 10 mg by mouth daily. 12/23/13  Yes Pricilla Riffle, MD  Cholecalciferol (VITAMIN D3) 2000 units capsule Take 2,000 Units by mouth daily.  01/11/16  Yes Historical Provider, MD  fluticasone (FLONASE) 50 MCG/ACT nasal spray USE 2 SPRAYS IN EACH NOSTRIL DAILY 05/23/16  Yes Kalman Shan, MD  formoterol  (FORADIL) 12 MCG capsule for inhaler Place 12 mcg into inhaler and inhale daily.  01/11/16  Yes Historical Provider, MD  gabapentin (NEURONTIN) 800 MG tablet Take 800 mg by mouth 2 (two) times daily.     Yes Historical Provider, MD  insulin aspart (NOVOLOG) 100 UNIT/ML injection Inject into the skin 3 (three) times daily with meals. Sliding scale 01/11/16  Yes Historical Provider, MD  insulin glargine (LANTUS) 100 UNIT/ML injection Inject 60 Units into the skin at bedtime.    Yes Historical Provider, MD  leflunomide (ARAVA) 20 MG tablet Take 1 tablet by mouth daily.   Yes Historical Provider, MD  Levothyroxine Sodium (SYNTHROID PO) Take 0.2 mg by mouth every morning.   Yes Historical Provider, MD  Liraglutide (VICTOZA) 18 MG/3ML SOPN 1.2 daily   Yes Historical Provider, MD  losartan (COZAAR) 50 MG tablet Take 1 tablet (50 mg total) by mouth daily. 08/24/13  Yes Zannie Cove, MD  metFORMIN (GLUCOPHAGE) 1000 MG tablet Take 1,000 mg by mouth 2 (two) times daily with a meal.   Yes Historical Provider, MD  mometasone (ASMANEX 60 METERED DOSES) 220 MCG/INH inhaler Inhale 2 puffs into the lungs daily.     Yes Historical Provider, MD  Multiple Vitamins-Minerals (MEGA MULTIVITAMIN FOR MEN PO) Take by mouth daily.     Yes Historical Provider, MD  Olodaterol HCl (STRIVERDI RESPIMAT) 2.5 MCG/ACT AERS Inhale 2.5 Inhalers into the lungs 1 day or 1 dose. 2 inhalations once a day   Yes Historical Provider, MD  OXYGEN Inhale 2 L into the lungs continuous.   Yes Historical Provider, MD  predniSONE (DELTASONE) 10 MG tablet Take 1 tablet by mouth daily.   Yes Historical Provider, MD  sertraline (ZOLOFT) 100 MG tablet 100 mg. Take 1/2 by mouth daily as needed   Yes Historical Provider, MD  Tamsulosin HCl (FLOMAX) 0.4 MG CAPS Take by mouth at bedtime.     Yes Historical Provider, MD  tiotropium (SPIRIVA HANDIHALER) 18 MCG inhalation capsule Place 18 mcg into inhaler and inhale daily.     Yes Historical Provider, MD    traMADol (ULTRAM) 50 MG tablet Take 1 tablet (50 mg total) by mouth every 8 (eight) hours as needed. 05/23/16  Yes Corwin Levins, MD   Allergies:   Patient has no known allergies.   Social History   Social History  . Marital status: Married    Spouse name: N/A  . Number of children: N/A  . Years of education: N/A   Occupational History  . disabled veteran, Ex Human resources officer Retired   Social History Main Topics  . Smoking status: Former Smoker    Packs/day: 2.50    Years: 40.00    Types: Cigarettes, Pipe, Cigars    Quit date: 07/28/1998  . Smokeless tobacco: Never Used     Comment: quit in 2000 x 40 yrs. 2.5 ppd  . Alcohol use No  . Drug use: No  . Sexual activity: Not on file   Other Topics Concern  .  Not on file   Social History Narrative   Lives in the home with his wife   Sees [place] every 6 month for meds.   Denies asbestos exposure     Family History:  The patient's family history includes Other in his mother.   ROS:   Please see the history of present illness.    ROS All other systems reviewed and are negative.   PHYSICAL EXAM:   VS:  BP (!) 190/90   Pulse 81   Ht 6\' 3"  (1.905 m)   Wt 228 lb 12.8 oz (103.8 kg)   SpO2 94% Comment: on 2 L  BMI 28.60 kg/m    GEN: Well nourished, well developed, in no acute distress  HEENT: normal  Neck: no JVD, carotid bruits, or masses Cardiac: RRR; no murmurs, rubs, or gallops,no edema  Respiratory:  clear to auscultation bilaterally, normal work of breathing GI: soft, nontender, nondistended, + BS MS: no deformity or atrophy  Skin: warm and dry, no rash Neuro:  Alert and Oriented x 3, Strength and sensation are intact Psych: euthymic mood, full affect  Wt Readings from Last 3 Encounters:  09/05/16 228 lb 12.8 oz (103.8 kg)  09/04/16 231 lb (104.8 kg)  07/10/16 234 lb (106.1 kg)      Studies/Labs Reviewed:   EKG:  EKG is not ordered today.    Recent Labs: 10/02/2015: TSH 0.45 07/10/2016: ALT 22; BUN 15;  Creatinine, Ser 1.01; Hemoglobin 12.0; Platelets 164.0; Potassium 4.7; Sodium 138   Lipid Panel    Component Value Date/Time   CHOL 145 04/04/2016 1047   TRIG 173.0 (H) 04/04/2016 1047   HDL 30.20 (L) 04/04/2016 1047   CHOLHDL 5 04/04/2016 1047   VLDL 34.6 04/04/2016 1047   LDLCALC 80 04/04/2016 1047   LDLDIRECT 66.0 10/02/2015 0942    Additional studies/ records that were reviewed today include:   Echocardiogram: 08/28/16 LV EF: 35% -   40%  ------------------------------------------------------------------- Indications:      Syncope (R55).  ------------------------------------------------------------------- History:   PMH:   Dyspnea.  Coronary artery disease.  Chronic obstructive pulmonary disease.  Risk factors:  Abdominal Aortic Aneurysm with Repair, Pulmonary Fibrosis with Chronic Respiratory Failure. Former tobacco use. Hypertension. Diabetes mellitus.  ------------------------------------------------------------------- Study Conclusions  - Left ventricle: The cavity size was mildly dilated. Wall   thickness was normal. Systolic function was moderately reduced.   The estimated ejection fraction was in the range of 35% to 40%.   Moderate diffuse hypokinesis. Possible hypokinesis of the   inferolateral and inferior myocardium. There was fusion of early   and atrial contributions to ventricular filling. - Ventricular septum: Septal motion showed abnormal function,   dyssynergy, and paradox. These changes are consistent with a left   bundle branch block.  Impressions:  - The decrease in left ventricular systolic function appears to be   primarily secondary to severe LBBB-related dyssynchrony. There   may also be regional hypokinesis in the distribution of the right   coronary artery. There is E-A fusion and e&'-a&' fusion due to   first degree AV block and relative tachycardia. During post-PVC   pauses the mitral inflow pattern is of impaired relaxation,    suggesting normal mean left atrial pressure.  Cath 12/2001 FINAL IMPRESSION: 1. Inferior apical hypokinesis, ejection fraction of 50%. 2. A 70% lesion in a diagonal that is 1.5 mm in size. 3. 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 There is    no significant flow-limiting lesions  noted. He will continue with    medical management.  ASSESSMENT & PLAN:    1. Chronic systolic CHF - EF of 35-40% on recent echo. No prior echo or stress test for further evaluation. Prior Cath report as above.   - I have discussed with Dr. Tenny Craw yesterday and today on phone.  His cardiomyopathy is due to LBBB or new ischemic cardiomyopathy. Given atypical chest pain that radiating down to L arm and shoulder will schedule for L & R cath. Has has chronic orthopnea, likely due to copd. Euvolemic on exam.   2. Syncope - Prodrome of dizziness. Uncertain etiology. Negative orthostatic. Does complains of dizziness with bending. Previously felt dizzy with head movement. Consider neurological evaluation if negative ischemic work up. He dose have resting tremors.   3. HTN - Elevated today. However did not took his medication this morning. Advised to keep long.     Medication Adjustments/Labs and Tests Ordered: Current medicines are reviewed at length with the patient today.  Concerns regarding medicines are outlined above.  Medication changes, Labs and Tests ordered today are listed in the Patient Instructions below. Patient Instructions  Medication Instructions:  Your physician recommends that you continue on your current medications as directed. Please refer to the Current Medication list given to you today.   Labwork: Your physician recommends that you have lab work today" BMET/CBC/PTT/PT/INR   Testing/Procedures:  Your provider has recommended a cardiac catherization  You are scheduled for a cardiac catheterization on Wednesday, February 14 with Dr. Clifton James or associate.  Please  arrive at the [facility] (Main Entrance) at [facility] at [address] [place] -  2nd Floor Short Stay on February 14  at 8:00 am.    Special note: Every effort is made to have your procedure done on time.   Please understand that emergencies sometimes delay a scheduled   procedure.  No food or drink after midnight on Tuesday, February 13.   You may take your morning medications with a sip of water on the day of your procedure.  Medications to HOLD - Hold Metformin 24 hours before, the day of and 48 hours after. 1/2 dose of insulin the night before, hold the am of procedure.  Plan for a one night stay -- bring personal belongings.  Bring a current list of your medications and current insurance cards.  You MUST have a responsible person to drive you home. Someone MUST be with you the first 24 hours after you arrive home or your discharge will be delayed. Wear clothes that are easy to get on and off and wear slip on shoes.    Coronary Angiogram A coronary angiogram, also called coronary angiography, is an X-ray procedure used to look at the arteries in the heart. In this procedure, a dye (contrast dye) is injected through a long, hollow tube (catheter). The catheter is about the size of a piece of cooked spaghetti and is inserted through your groin, wrist, or arm. The dye is injected into each artery, and X-rays are then taken to show if there is a blockage in the arteries of your heart.  LET [facility] CARE PROVIDER KNOW ABOUT:  Any allergies you have, including allergies to shellfish or contrast dye.   All medicines you are taking, including vitamins, herbs, eye drops, creams, and over-the-counter medicines.   Previous problems you or members of your family have had with the use of anesthetics.   Any blood disorders you have.  Previous surgeries you have had.  History of kidney problems or failure.   Other medical conditions you have.  RISKS AND  COMPLICATIONS  Generally, a coronary angiogram is a safe procedure. However, about 1 person out of 1000 can have problems that may include:  Allergic reaction to the dye.  Bleeding/bruising from the access site or other locations.  Kidney injury, especially in people with impaired kidney function.  Stroke (rare).  Heart attack (rare).  Irregular rhythms (rare)  Death (rare)  BEFORE THE PROCEDURE   Do not eat or drink anything after midnight the night before the procedure or as directed by your health care provider.   Ask your health care provider about changing or stopping your regular medicines. This is especially important if you are taking diabetes medicines or blood thinners.  PROCEDURE  You may be given a medicine to help you relax (sedative) before the procedure. This medicine is given through an intravenous (IV) access tube that is inserted into one of your veins.   The area where the catheter will be inserted will be washed and shaved. This is usually done in the groin but may be done in the fold of your arm (near your elbow) or in the wrist.   A medicine will be given to numb the area where the catheter will be inserted (local anesthetic).   The health care provider will insert the catheter into an artery. The catheter will be guided by using a special type of X-ray (fluoroscopy) of the blood vessel being examined.   A special dye will then be injected into the catheter, and X-rays will be taken. The dye will help to show where any narrowing or blockages are located in the heart arteries.    AFTER THE PROCEDURE   If the procedure is done through the leg, you will be kept in bed lying flat for several hours. You will be instructed to not bend or cross your legs.  The insertion site will be checked frequently.   The pulse in your feet or wrist will be checked frequently.   Additional blood tests, X-rays, and an electrocardiogram may be done.      Follow-Up: Your physician recommends that you keep your scheduled  follow-up appointment with Dr. Tenny Craw.    Any Other Special Instructions Will Be Listed Below (If Applicable).     If you need a refill on your cardiac medications before your next appointment, please call your pharmacy.      Lorelei Pont, [place]  09/05/2016 1:00 PM    [facility] [address] [place], [place], [place]  [address] Phone: [phone]; Fax: [phone]

## 2016-09-04 NOTE — Assessment & Plan Note (Signed)
Newly dx with Cardiomyopathy with recent echo showing decreased LVSF EF at 35-40%, with severe LBBB related dyssyncrony, mod diffuse hypokinesis . Vent septum motion w/ abn fxn, dyssynergy, and paradox.  This has been associated with syncope.  Called cardiology and will see him tomorrow at 10:00am . Appreciate their assitance.

## 2016-09-04 NOTE — Progress Notes (Signed)
Reviewed & agree with plan  

## 2016-09-04 NOTE — Patient Instructions (Addendum)
Continue on Asmanex and Spiriva .  Continue on Oxygen 2l/m .  Follow up with Cardiology as discussed -tomorrow at 09/05/16 at 10:00, be there at 9:45.  follow up with Dr. Vassie Loll in 3 months and As needed

## 2016-09-05 ENCOUNTER — Ambulatory Visit (INDEPENDENT_AMBULATORY_CARE_PROVIDER_SITE_OTHER): Payer: Medicare Other | Admitting: Physician Assistant

## 2016-09-05 VITALS — BP 190/90 | HR 81 | Ht 75.0 in | Wt 228.8 lb

## 2016-09-05 DIAGNOSIS — Z9889 Other specified postprocedural states: Secondary | ICD-10-CM | POA: Diagnosis not present

## 2016-09-05 DIAGNOSIS — R55 Syncope and collapse: Secondary | ICD-10-CM | POA: Diagnosis not present

## 2016-09-05 DIAGNOSIS — I5022 Chronic systolic (congestive) heart failure: Secondary | ICD-10-CM

## 2016-09-05 DIAGNOSIS — I447 Left bundle-branch block, unspecified: Secondary | ICD-10-CM

## 2016-09-05 DIAGNOSIS — I10 Essential (primary) hypertension: Secondary | ICD-10-CM

## 2016-09-05 LAB — PROTIME-INR
INR: 1.1 (ref 0.8–1.2)
Prothrombin Time: 11.2 s (ref 9.1–12.0)

## 2016-09-05 LAB — BASIC METABOLIC PANEL
BUN / CREAT RATIO: 18 (ref 10–24)
BUN: 12 mg/dL (ref 8–27)
CO2: 25 mmol/L (ref 18–29)
Calcium: 9.7 mg/dL (ref 8.6–10.2)
Chloride: 97 mmol/L (ref 96–106)
Creatinine, Ser: 0.68 mg/dL — ABNORMAL LOW (ref 0.76–1.27)
GFR calc Af Amer: 108 mL/min/{1.73_m2} (ref >59)
GFR calc non Af Amer: 93 mL/min/{1.73_m2} (ref >59)
GLUCOSE: 214 mg/dL — AB (ref 65–99)
POTASSIUM: 4.4 mmol/L (ref 3.5–5.2)
SODIUM: 140 mmol/L (ref 134–144)

## 2016-09-05 LAB — CBC WITH DIFFERENTIAL/PLATELET
BASOS: 0 %
Basophils Absolute: 0 10*3/uL (ref 0.0–0.2)
EOS (ABSOLUTE): 0.1 10*3/uL (ref 0.0–0.4)
EOS: 1 %
HEMATOCRIT: 36.7 % — AB (ref 37.5–51.0)
Hemoglobin: 12 g/dL — ABNORMAL LOW (ref 13.0–17.7)
IMMATURE GRANULOCYTES: 0 %
Immature Grans (Abs): 0 10*3/uL (ref 0.0–0.1)
LYMPHS ABS: 1.6 10*3/uL (ref 0.7–3.1)
Lymphs: 19 %
MCH: 27.4 pg (ref 26.6–33.0)
MCHC: 32.7 g/dL (ref 31.5–35.7)
MCV: 84 fL (ref 79–97)
MONOS ABS: 0.9 10*3/uL (ref 0.1–0.9)
Monocytes: 11 %
NEUTROS PCT: 69 %
Neutrophils Absolute: 5.8 10*3/uL (ref 1.4–7.0)
PLATELETS: 141 10*3/uL — AB (ref 150–379)
RBC: 4.38 x10E6/uL (ref 4.14–5.80)
RDW: 16 % — AB (ref 12.3–15.4)
WBC: 8.5 10*3/uL (ref 3.4–10.8)

## 2016-09-05 LAB — APTT: APTT: 23 s — AB (ref 24–33)

## 2016-09-05 NOTE — Patient Instructions (Signed)
Medication Instructions:  Your physician recommends that you continue on your current medications as directed. Please refer to the Current Medication list given to you today.   Labwork: Your physician recommends that you have lab work today" BMET/CBC/PTT/PT/INR   Testing/Procedures:  Your provider has recommended a cardiac catherization  You are scheduled for a cardiac catheterization on Wednesday, February 14 with Dr. Clifton James or associate.  Please arrive at the [facility] (Main Entrance) at [facility] at [address], [place] -  2nd Floor Short Stay on February 14  at 8:00 am.    Special note: Every effort is made to have your procedure done on time.   Please understand that emergencies sometimes delay a scheduled   procedure.  No food or drink after midnight on Tuesday, February 13.   You may take your morning medications with a sip of water on the day of your procedure.  Medications to HOLD - Hold Metformin 24 hours before, the day of and 48 hours after. 1/2 dose of insulin the night before, hold the am of procedure.  Plan for a one night stay -- bring personal belongings.  Bring a current list of your medications and current insurance cards.  You MUST have a responsible person to drive you home. Someone MUST be with you the first 24 hours after you arrive home or your discharge will be delayed. Wear clothes that are easy to get on and off and wear slip on shoes.    Coronary Angiogram A coronary angiogram, also called coronary angiography, is an X-ray procedure used to look at the arteries in the heart. In this procedure, a dye (contrast dye) is injected through a long, hollow tube (catheter). The catheter is about the size of a piece of cooked spaghetti and is inserted through your groin, wrist, or arm. The dye is injected into each artery, and X-rays are then taken to show if there is a blockage in the arteries of your heart.  LET [facility] CARE PROVIDER KNOW  ABOUT:  Any allergies you have, including allergies to shellfish or contrast dye.   All medicines you are taking, including vitamins, herbs, eye drops, creams, and over-the-counter medicines.   Previous problems you or members of your family have had with the use of anesthetics.   Any blood disorders you have.   Previous surgeries you have had.  History of kidney problems or failure.   Other medical conditions you have.  RISKS AND COMPLICATIONS  Generally, a coronary angiogram is a safe procedure. However, about 1 person out of 1000 can have problems that may include:  Allergic reaction to the dye.  Bleeding/bruising from the access site or other locations.  Kidney injury, especially in people with impaired kidney function.  Stroke (rare).  Heart attack (rare).  Irregular rhythms (rare)  Death (rare)  BEFORE THE PROCEDURE   Do not eat or drink anything after midnight the night before the procedure or as directed by your health care provider.   Ask your health care provider about changing or stopping your regular medicines. This is especially important if you are taking diabetes medicines or blood thinners.  PROCEDURE  You may be given a medicine to help you relax (sedative) before the procedure. This medicine is given through an intravenous (IV) access tube that is inserted into one of your veins.   The area where the catheter will be inserted will be washed and shaved. This is usually done in the groin but may  be done in the fold of your arm (near your elbow) or in the wrist.   A medicine will be given to numb the area where the catheter will be inserted (local anesthetic).   The health care provider will insert the catheter into an artery. The catheter will be guided by using a special type of X-ray (fluoroscopy) of the blood vessel being examined.   A special dye will then be injected into the catheter, and X-rays will be taken. The dye will help to  show where any narrowing or blockages are located in the heart arteries.    AFTER THE PROCEDURE   If the procedure is done through the leg, you will be kept in bed lying flat for several hours. You will be instructed to not bend or cross your legs.  The insertion site will be checked frequently.   The pulse in your feet or wrist will be checked frequently.   Additional blood tests, X-rays, and an electrocardiogram may be done.     Follow-Up: Your physician recommends that you keep your scheduled  follow-up appointment with Dr. Tenny Craw.    Any Other Special Instructions Will Be Listed Below (If Applicable).     If you need a refill on your cardiac medications before your next appointment, please call your pharmacy.

## 2016-09-10 ENCOUNTER — Encounter (HOSPITAL_COMMUNITY): Payer: Self-pay | Admitting: Cardiovascular Disease

## 2016-09-10 ENCOUNTER — Encounter (HOSPITAL_COMMUNITY)
Admission: RE | Disposition: A | Discharge status: Home / Self Care | Payer: Self-pay | Source: Ambulatory Visit | Attending: Cardiovascular Disease

## 2016-09-10 ENCOUNTER — Other Ambulatory Visit: Payer: Self-pay

## 2016-09-10 ENCOUNTER — Ambulatory Visit (HOSPITAL_COMMUNITY)
Admission: RE | Admit: 2016-09-10 | Discharge: 2016-09-11 | Disposition: A | Discharge status: Home / Self Care | Payer: Medicare Other | Source: Ambulatory Visit | Attending: Cardiovascular Disease | Admitting: Cardiovascular Disease

## 2016-09-10 DIAGNOSIS — E1169 Type 2 diabetes mellitus with other specified complication: Secondary | ICD-10-CM | POA: Diagnosis present

## 2016-09-10 DIAGNOSIS — E785 Hyperlipidemia, unspecified: Secondary | ICD-10-CM | POA: Diagnosis not present

## 2016-09-10 DIAGNOSIS — I25110 Atherosclerotic heart disease of native coronary artery with unstable angina pectoris: Secondary | ICD-10-CM | POA: Diagnosis present

## 2016-09-10 DIAGNOSIS — F4310 Post-traumatic stress disorder, unspecified: Secondary | ICD-10-CM | POA: Insufficient documentation

## 2016-09-10 DIAGNOSIS — J449 Chronic obstructive pulmonary disease, unspecified: Secondary | ICD-10-CM | POA: Insufficient documentation

## 2016-09-10 DIAGNOSIS — I5043 Acute on chronic combined systolic (congestive) and diastolic (congestive) heart failure: Secondary | ICD-10-CM | POA: Insufficient documentation

## 2016-09-10 DIAGNOSIS — I2541 Coronary artery aneurysm: Secondary | ICD-10-CM | POA: Diagnosis not present

## 2016-09-10 DIAGNOSIS — J9611 Chronic respiratory failure with hypoxia: Secondary | ICD-10-CM | POA: Diagnosis not present

## 2016-09-10 DIAGNOSIS — I272 Pulmonary hypertension, unspecified: Secondary | ICD-10-CM | POA: Insufficient documentation

## 2016-09-10 DIAGNOSIS — Z7952 Long term (current) use of systemic steroids: Secondary | ICD-10-CM | POA: Insufficient documentation

## 2016-09-10 DIAGNOSIS — F329 Major depressive disorder, single episode, unspecified: Secondary | ICD-10-CM | POA: Diagnosis not present

## 2016-09-10 DIAGNOSIS — Z794 Long term (current) use of insulin: Secondary | ICD-10-CM | POA: Diagnosis not present

## 2016-09-10 DIAGNOSIS — E039 Hypothyroidism, unspecified: Secondary | ICD-10-CM | POA: Diagnosis not present

## 2016-09-10 DIAGNOSIS — E119 Type 2 diabetes mellitus without complications: Secondary | ICD-10-CM | POA: Insufficient documentation

## 2016-09-10 DIAGNOSIS — I714 Abdominal aortic aneurysm, without rupture: Secondary | ICD-10-CM | POA: Diagnosis not present

## 2016-09-10 DIAGNOSIS — M069 Rheumatoid arthritis, unspecified: Secondary | ICD-10-CM | POA: Insufficient documentation

## 2016-09-10 DIAGNOSIS — Z955 Presence of coronary angioplasty implant and graft: Secondary | ICD-10-CM

## 2016-09-10 DIAGNOSIS — M1990 Unspecified osteoarthritis, unspecified site: Secondary | ICD-10-CM | POA: Insufficient documentation

## 2016-09-10 DIAGNOSIS — Z87891 Personal history of nicotine dependence: Secondary | ICD-10-CM | POA: Insufficient documentation

## 2016-09-10 DIAGNOSIS — I447 Left bundle-branch block, unspecified: Secondary | ICD-10-CM | POA: Diagnosis not present

## 2016-09-10 DIAGNOSIS — I10 Essential (primary) hypertension: Secondary | ICD-10-CM | POA: Diagnosis present

## 2016-09-10 DIAGNOSIS — F419 Anxiety disorder, unspecified: Secondary | ICD-10-CM | POA: Insufficient documentation

## 2016-09-10 DIAGNOSIS — Z7951 Long term (current) use of inhaled steroids: Secondary | ICD-10-CM | POA: Insufficient documentation

## 2016-09-10 DIAGNOSIS — I110 Hypertensive heart disease with heart failure: Secondary | ICD-10-CM | POA: Diagnosis not present

## 2016-09-10 DIAGNOSIS — N400 Enlarged prostate without lower urinary tract symptoms: Secondary | ICD-10-CM | POA: Diagnosis not present

## 2016-09-10 DIAGNOSIS — I255 Ischemic cardiomyopathy: Secondary | ICD-10-CM | POA: Insufficient documentation

## 2016-09-10 DIAGNOSIS — J8410 Pulmonary fibrosis, unspecified: Secondary | ICD-10-CM | POA: Insufficient documentation

## 2016-09-10 HISTORY — PX: RIGHT/LEFT HEART CATH AND CORONARY ANGIOGRAPHY: CATH118266

## 2016-09-10 HISTORY — DX: Pure hypercholesterolemia, unspecified: E78.00

## 2016-09-10 HISTORY — PX: CORONARY STENT INTERVENTION: CATH118234

## 2016-09-10 HISTORY — DX: Type 2 diabetes mellitus without complications: E11.9

## 2016-09-10 HISTORY — DX: Pneumonia, unspecified organism: J18.9

## 2016-09-10 HISTORY — DX: Anxiety disorder, unspecified: F41.9

## 2016-09-10 HISTORY — DX: Dependence on supplemental oxygen: Z99.81

## 2016-09-10 HISTORY — PX: CORONARY ANGIOPLASTY WITH STENT PLACEMENT: SHX49

## 2016-09-10 HISTORY — DX: Obstructive sleep apnea (adult) (pediatric): G47.33

## 2016-09-10 HISTORY — DX: Obstructive sleep apnea (adult) (pediatric): Z99.89

## 2016-09-10 LAB — POCT I-STAT 3, ART BLOOD GAS (G3+)
ACID-BASE EXCESS: 1 mmol/L (ref 0.0–2.0)
Bicarbonate: 28.5 mmol/L — ABNORMAL HIGH (ref 20.0–28.0)
O2 SAT: 96 %
PCO2 ART: 56.5 mmHg — AB (ref 32.0–48.0)
PO2 ART: 92 mmHg (ref 83.0–108.0)
TCO2: 30 mmol/L (ref 0–100)
pH, Arterial: 7.311 — ABNORMAL LOW (ref 7.350–7.450)

## 2016-09-10 LAB — POCT I-STAT 3, VENOUS BLOOD GAS (G3P V)
ACID-BASE EXCESS: 1 mmol/L (ref 0.0–2.0)
Bicarbonate: 27.7 mmol/L (ref 20.0–28.0)
O2 SAT: 74 %
PCO2 VEN: 56.4 mmHg (ref 44.0–60.0)
PO2 VEN: 44 mmHg (ref 32.0–45.0)
TCO2: 29 mmol/L (ref 0–100)
pH, Ven: 7.299 (ref 7.250–7.430)

## 2016-09-10 LAB — GLUCOSE, CAPILLARY
GLUCOSE-CAPILLARY: 86 mg/dL (ref 65–99)
Glucose-Capillary: 124 mg/dL — ABNORMAL HIGH (ref 65–99)
Glucose-Capillary: 230 mg/dL — ABNORMAL HIGH (ref 65–99)
Glucose-Capillary: 178 mg/dL — ABNORMAL HIGH (ref 65–99)

## 2016-09-10 LAB — POCT ACTIVATED CLOTTING TIME
ACTIVATED CLOTTING TIME: 241 s
Activated Clotting Time: 274 seconds

## 2016-09-10 SURGERY — RIGHT/LEFT HEART CATH AND CORONARY ANGIOGRAPHY
Anesthesia: LOCAL

## 2016-09-10 MED ORDER — ANGIOPLASTY BOOK
Freq: Once | Status: AC
  Administered 2016-09-10: 21:00:00
  Filled 2016-09-10: qty 1

## 2016-09-10 MED ORDER — BUDESONIDE 0.25 MG/2ML IN SUSP
0.2500 mg | Freq: Two times a day (BID) | RESPIRATORY_TRACT | Status: DC
  Administered 2016-09-10 – 2016-09-11 (×2): 0.25 mg via RESPIRATORY_TRACT
  Filled 2016-09-10 (×2): qty 2

## 2016-09-10 MED ORDER — CLOPIDOGREL BISULFATE 300 MG PO TABS
ORAL_TABLET | ORAL | Status: DC | PRN
  Administered 2016-09-10: 600 mg via ORAL

## 2016-09-10 MED ORDER — ASPIRIN 81 MG PO CHEW
81.0000 mg | CHEWABLE_TABLET | ORAL | Status: AC
  Administered 2016-09-10: 81 mg via ORAL

## 2016-09-10 MED ORDER — IOPAMIDOL (ISOVUE-370) INJECTION 76%
INTRAVENOUS | Status: AC
  Filled 2016-09-10: qty 50

## 2016-09-10 MED ORDER — MIDAZOLAM HCL 2 MG/2ML IJ SOLN
INTRAMUSCULAR | Status: DC | PRN
  Administered 2016-09-10: 2 mg via INTRAVENOUS
  Administered 2016-09-10: 1 mg via INTRAVENOUS

## 2016-09-10 MED ORDER — HEPARIN SODIUM (PORCINE) 1000 UNIT/ML IJ SOLN
INTRAMUSCULAR | Status: AC
  Filled 2016-09-10: qty 1

## 2016-09-10 MED ORDER — SODIUM CHLORIDE 0.9% FLUSH
3.0000 mL | INTRAVENOUS | Status: DC | PRN
Start: 2016-09-10 — End: 2016-09-10

## 2016-09-10 MED ORDER — LIDOCAINE HCL (PF) 1 % IJ SOLN
INTRAMUSCULAR | Status: AC
  Filled 2016-09-10: qty 30

## 2016-09-10 MED ORDER — LIDOCAINE HCL (PF) 1 % IJ SOLN
INTRAMUSCULAR | Status: DC | PRN
  Administered 2016-09-10 (×2): 2 mL

## 2016-09-10 MED ORDER — LABETALOL HCL 5 MG/ML IV SOLN: 10.0000 mg | INTRAVENOUS | Status: AC | PRN

## 2016-09-10 MED ORDER — PREDNISONE 5 MG PO TABS
10.0000 mg | ORAL_TABLET | Freq: Every day | ORAL | Status: DC
  Administered 2016-09-10 – 2016-09-11 (×2): 10 mg via ORAL
  Filled 2016-09-10 (×2): qty 2

## 2016-09-10 MED ORDER — ARFORMOTEROL TARTRATE 15 MCG/2ML IN NEBU
15.0000 ug | INHALATION_SOLUTION | Freq: Two times a day (BID) | RESPIRATORY_TRACT | Status: DC
  Administered 2016-09-10 – 2016-09-11 (×2): 15 ug via RESPIRATORY_TRACT
  Filled 2016-09-10 (×2): qty 2

## 2016-09-10 MED ORDER — HEPARIN SODIUM (PORCINE) 1000 UNIT/ML IJ SOLN
INTRAMUSCULAR | Status: DC | PRN
  Administered 2016-09-10: 3000 [IU] via INTRAVENOUS
  Administered 2016-09-10: 5000 [IU] via INTRAVENOUS
  Administered 2016-09-10: 6000 [IU] via INTRAVENOUS

## 2016-09-10 MED ORDER — SODIUM CHLORIDE 0.9 % IV SOLN: INTRAVENOUS | Status: AC

## 2016-09-10 MED ORDER — FENTANYL CITRATE (PF) 100 MCG/2ML IJ SOLN
INTRAMUSCULAR | Status: DC | PRN
  Administered 2016-09-10: 25 ug via INTRAVENOUS
  Administered 2016-09-10: 50 ug via INTRAVENOUS

## 2016-09-10 MED ORDER — CLOPIDOGREL BISULFATE 75 MG PO TABS
75.0000 mg | ORAL_TABLET | Freq: Every day | ORAL | Status: DC
  Administered 2016-09-11: 75 mg via ORAL
  Filled 2016-09-10: qty 1

## 2016-09-10 MED ORDER — INSULIN ASPART 100 UNIT/ML ~~LOC~~ SOLN
8.0000 [IU] | Freq: Every day | SUBCUTANEOUS | Status: DC
  Administered 2016-09-10: 18:00:00 8 [IU] via SUBCUTANEOUS

## 2016-09-10 MED ORDER — ORAL CARE MOUTH RINSE
15.0000 mL | Freq: Two times a day (BID) | OROMUCOSAL | Status: DC
  Administered 2016-09-10: 15 mL via OROMUCOSAL

## 2016-09-10 MED ORDER — SODIUM CHLORIDE 0.9% FLUSH: 3.0000 mL | INTRAVENOUS | Status: DC | PRN

## 2016-09-10 MED ORDER — ASPIRIN 81 MG PO CHEW
81.0000 mg | CHEWABLE_TABLET | ORAL | Status: DC
Start: 2016-09-11 — End: 2016-09-10

## 2016-09-10 MED ORDER — HEPARIN (PORCINE) IN NACL 2-0.9 UNIT/ML-% IJ SOLN
INTRAMUSCULAR | Status: AC
  Filled 2016-09-10: qty 500

## 2016-09-10 MED ORDER — SODIUM CHLORIDE 0.9% FLUSH: 3.0000 mL | Freq: Two times a day (BID) | INTRAVENOUS | Status: DC

## 2016-09-10 MED ORDER — GABAPENTIN 400 MG PO CAPS
800.0000 mg | ORAL_CAPSULE | Freq: Two times a day (BID) | ORAL | Status: DC
  Administered 2016-09-10 – 2016-09-11 (×3): 800 mg via ORAL
  Filled 2016-09-10 (×3): qty 2

## 2016-09-10 MED ORDER — SERTRALINE HCL 50 MG PO TABS
50.0000 mg | ORAL_TABLET | Freq: Every day | ORAL | Status: DC
  Administered 2016-09-10 – 2016-09-11 (×2): 50 mg via ORAL
  Filled 2016-09-10 (×2): qty 1

## 2016-09-10 MED ORDER — GABAPENTIN 800 MG PO TABS
800.0000 mg | ORAL_TABLET | Freq: Two times a day (BID) | ORAL | Status: DC
  Filled 2016-09-10: qty 1

## 2016-09-10 MED ORDER — LEVOTHYROXINE SODIUM 100 MCG PO TABS
200.0000 ug | ORAL_TABLET | Freq: Every day | ORAL | Status: DC
  Administered 2016-09-10 – 2016-09-11 (×2): 200 ug via ORAL
  Filled 2016-09-10 (×2): qty 2

## 2016-09-10 MED ORDER — TAMSULOSIN HCL 0.4 MG PO CAPS
0.4000 mg | ORAL_CAPSULE | Freq: Every day | ORAL | Status: DC
  Administered 2016-09-10: 21:00:00 0.4 mg via ORAL
  Filled 2016-09-10: qty 1

## 2016-09-10 MED ORDER — LIRAGLUTIDE 18 MG/3ML ~~LOC~~ SOPN: 1.2000 mg | PEN_INJECTOR | Freq: Every day | SUBCUTANEOUS | Status: DC

## 2016-09-10 MED ORDER — FUROSEMIDE 10 MG/ML IJ SOLN
40.0000 mg | Freq: Every day | INTRAMUSCULAR | Status: DC
  Administered 2016-09-10: 13:00:00 40 mg via INTRAVENOUS
  Filled 2016-09-10: qty 4

## 2016-09-10 MED ORDER — HYDRALAZINE HCL 20 MG/ML IJ SOLN
5.0000 mg | INTRAMUSCULAR | Status: AC | PRN
  Administered 2016-09-10: 5 mg via INTRAVENOUS
  Filled 2016-09-10: qty 1

## 2016-09-10 MED ORDER — SODIUM CHLORIDE 0.9 % IV SOLN: 250.0000 mL | INTRAVENOUS | Status: DC | PRN

## 2016-09-10 MED ORDER — ATORVASTATIN CALCIUM 20 MG PO TABS
20.0000 mg | ORAL_TABLET | Freq: Every day | ORAL | Status: DC
  Administered 2016-09-10: 20 mg via ORAL
  Filled 2016-09-10: qty 1

## 2016-09-10 MED ORDER — LEFLUNOMIDE 20 MG PO TABS
20.0000 mg | ORAL_TABLET | Freq: Every day | ORAL | Status: DC
  Administered 2016-09-10 – 2016-09-11 (×2): 20 mg via ORAL
  Filled 2016-09-10 (×2): qty 1

## 2016-09-10 MED ORDER — IOPAMIDOL (ISOVUE-370) INJECTION 76%
INTRAVENOUS | Status: AC
  Filled 2016-09-10: qty 100

## 2016-09-10 MED ORDER — ALBUTEROL SULFATE HFA 108 (90 BASE) MCG/ACT IN AERS: 2.0000 | INHALATION_SPRAY | RESPIRATORY_TRACT | Status: DC | PRN

## 2016-09-10 MED ORDER — ONDANSETRON HCL 4 MG/2ML IJ SOLN: 4.0000 mg | Freq: Four times a day (QID) | INTRAMUSCULAR | Status: DC | PRN

## 2016-09-10 MED ORDER — SODIUM CHLORIDE 0.9 % WEIGHT BASED INFUSION
3.0000 mL/kg/h | INTRAVENOUS | Status: DC
  Administered 2016-09-10: 3 mL/kg/h via INTRAVENOUS

## 2016-09-10 MED ORDER — ALPRAZOLAM 0.5 MG PO TABS
0.5000 mg | ORAL_TABLET | Freq: Every day | ORAL | Status: DC
  Administered 2016-09-10: 21:00:00 0.5 mg via ORAL
  Filled 2016-09-10: qty 1

## 2016-09-10 MED ORDER — ALBUTEROL SULFATE (2.5 MG/3ML) 0.083% IN NEBU: 2.5000 mg | INHALATION_SOLUTION | Freq: Four times a day (QID) | RESPIRATORY_TRACT | Status: DC | PRN

## 2016-09-10 MED ORDER — ACETAMINOPHEN 325 MG PO TABS
650.0000 mg | ORAL_TABLET | ORAL | Status: DC | PRN
  Administered 2016-09-10: 12:00:00 650 mg via ORAL
  Filled 2016-09-10: qty 2

## 2016-09-10 MED ORDER — MIDAZOLAM HCL 2 MG/2ML IJ SOLN
INTRAMUSCULAR | Status: AC
  Filled 2016-09-10: qty 2

## 2016-09-10 MED ORDER — FAMOTIDINE IN NACL 20-0.9 MG/50ML-% IV SOLN
INTRAVENOUS | Status: AC
  Filled 2016-09-10: qty 50

## 2016-09-10 MED ORDER — SODIUM CHLORIDE 0.9% FLUSH
3.0000 mL | Freq: Two times a day (BID) | INTRAVENOUS | Status: DC
  Administered 2016-09-10 (×2): 3 mL via INTRAVENOUS

## 2016-09-10 MED ORDER — LOSARTAN POTASSIUM 50 MG PO TABS
50.0000 mg | ORAL_TABLET | Freq: Every day | ORAL | Status: DC
  Administered 2016-09-10 – 2016-09-11 (×2): 50 mg via ORAL
  Filled 2016-09-10 (×2): qty 1

## 2016-09-10 MED ORDER — INSULIN GLARGINE 100 UNIT/ML ~~LOC~~ SOLN
40.0000 [IU] | Freq: Every day | SUBCUTANEOUS | Status: DC
  Administered 2016-09-10: 40 [IU] via SUBCUTANEOUS
  Filled 2016-09-10: qty 0.4

## 2016-09-10 MED ORDER — ASPIRIN 81 MG PO CHEW
CHEWABLE_TABLET | ORAL | Status: AC
  Administered 2016-09-10: 81 mg via ORAL
  Filled 2016-09-10: qty 1

## 2016-09-10 MED ORDER — VERAPAMIL HCL 2.5 MG/ML IV SOLN
INTRAVENOUS | Status: AC
  Filled 2016-09-10: qty 2

## 2016-09-10 MED ORDER — HEPARIN (PORCINE) IN NACL 2-0.9 UNIT/ML-% IJ SOLN
INTRAMUSCULAR | Status: DC | PRN
  Administered 2016-09-10: 1500 mL

## 2016-09-10 MED ORDER — HEPARIN (PORCINE) IN NACL 2-0.9 UNIT/ML-% IJ SOLN
INTRAMUSCULAR | Status: AC
  Filled 2016-09-10: qty 1000

## 2016-09-10 MED ORDER — SODIUM CHLORIDE 0.9 % WEIGHT BASED INFUSION: 1.0000 mL/kg/h | INTRAVENOUS | Status: DC

## 2016-09-10 MED ORDER — CLOPIDOGREL BISULFATE 300 MG PO TABS
ORAL_TABLET | ORAL | Status: AC
  Filled 2016-09-10: qty 2

## 2016-09-10 MED ORDER — FAMOTIDINE IN NACL 20-0.9 MG/50ML-% IV SOLN
INTRAVENOUS | Status: DC | PRN
  Administered 2016-09-10: 20 mg via INTRAVENOUS

## 2016-09-10 MED ORDER — FENTANYL CITRATE (PF) 100 MCG/2ML IJ SOLN
INTRAMUSCULAR | Status: AC
  Filled 2016-09-10: qty 2

## 2016-09-10 MED ORDER — TIOTROPIUM BROMIDE MONOHYDRATE 18 MCG IN CAPS
18.0000 ug | ORAL_CAPSULE | Freq: Every day | RESPIRATORY_TRACT | Status: DC
  Administered 2016-09-11: 18 ug via RESPIRATORY_TRACT
  Filled 2016-09-10: qty 5

## 2016-09-10 MED ORDER — IOPAMIDOL (ISOVUE-370) INJECTION 76%
INTRAVENOUS | Status: DC | PRN
Start: 2016-09-10 — End: 2016-09-10
  Administered 2016-09-10: 170 mL via INTRA_ARTERIAL

## 2016-09-10 SURGICAL SUPPLY — 21 items
BALLN EUPHORA RX 2.5X12 (BALLOONS) ×2
BALLN ~~LOC~~ MOZEC 4.5X10 (BALLOONS) ×2
BALLOON EUPHORA RX 2.5X12 (BALLOONS) ×1 IMPLANT
BALLOON ~~LOC~~ MOZEC 4.5X10 (BALLOONS) IMPLANT
CATH BALLN WEDGE 5F 110CM (CATHETERS) ×1 IMPLANT
CATH INFINITI 5 FR JL3.5 (CATHETERS) ×1 IMPLANT
CATH INFINITI JR4 5F (CATHETERS) ×2 IMPLANT
CATH VISTA GUIDE 6FR AL1 (CATHETERS) ×1 IMPLANT
DEVICE RAD COMP TR BAND LRG (VASCULAR PRODUCTS) ×1 IMPLANT
GLIDESHEATH SLEND SS 6F .021 (SHEATH) ×1 IMPLANT
GUIDEWIRE INQWIRE 1.5J.035X260 (WIRE) ×1 IMPLANT
INQWIRE 1.5J .035X260CM (WIRE) ×2
KIT ENCORE 26 ADVANTAGE (KITS) ×1 IMPLANT
KIT HEART LEFT (KITS) ×2 IMPLANT
PACK CARDIAC CATHETERIZATION (CUSTOM PROCEDURE TRAY) ×2 IMPLANT
SHEATH FAST CATH BRACH 5F 5CM (SHEATH) ×2 IMPLANT
STENT SYNERGY DES 4X16 (Permanent Stent) ×1 IMPLANT
TRANSDUCER W/STOPCOCK (MISCELLANEOUS) ×2 IMPLANT
TUBING CIL FLEX 10 FLL-RA (TUBING) ×2 IMPLANT
WIRE COUGAR XT STRL 190CM (WIRE) ×1 IMPLANT
WIRE EMERALD 3MM-J .025X260CM (WIRE) ×1 IMPLANT

## 2016-09-10 NOTE — H&P (View-Only) (Signed)
 Cardiology Office Note    Date:  09/05/2016   ID:  [name], DOB 02/22/1941, MRN [id number]  PCP:  Dakota John, MD  Cardiologist: Dr. Ross  Chief Complaint: Cardiomyopathy and syncope  History of Present Illness:   [name] is a 76 y.o. male CAD, chronic LBBB,  COPD and chronic hypoxemic respiratory failurerequiring oxygen (follow by Dr. Ramaswamy) , HTN, RA on chronic steroids and recent diagnosis for cardiomyopathy (for syncope evaluation)  presents for further evaluation.   2 heart caths in the past.  One in 2000s showed one ulcerated plaque  Rx medically.   [facility] No signif [facility]. He was doing well on cardiac stand point when last seen by Dr. Ross 04/2016.   He has episode of syncope in Dec 2017. He  became dizzy after carrying boxes from one to the other for wife, does tend to have head position change dizziness as well but this was different, had to sit down, but still passed out. No other associated symptoms.   Carotid doppler 08/25/16 showed 1-39% RICA and 40-59% LICA stenosis. Echo showed new depressed EF of 35-40% appears to be primarily secondary to severe LBBB-related dyssynchrony. There may also be regional hypokinesis in the distribution of the right coronary artery.   Here for further evaluation. Previously he did not felt orthostatic. No further episode syncope. He also had one episode in 2015. He feels dizzy with bending down. Intermittent achy/pressure L upper chest pain that radiates to L shoulder or arm. Denies palpations, LE edema, melena or blood in his stool or urine. She sleeps on recliner chronically due to COPD and chronic respiratory failure.   Elevated BP today however he hasn't took his Losartan today.   Past Medical History:  Diagnosis Date  . BENIGN PROSTATIC HYPERTROPHY 11/23/2009  . C O P D 07/30/2009  . Cardiomyopathy (HCC) 08/28/2016  . CHRONIC OBSTRUCTIVE PULMONARY DISEASE, ACUTE EXACERBATION 03/01/2010  . CORONARY ARTERY DISEASE  11/23/2009  . 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 DEGENERATIVE JOINT DISEASE 11/23/2009  . DEPRESSION 11/23/2009  . DIABETES MELLITUS, TYPE II 11/23/2009  . DYSPNEA/SHORTNESS OF BREATH 12/08/2009  . FATIGUE 11/23/2009  . GAIT DISTURBANCE 12/10/2009  . HEMOPTYSIS UNSPECIFIED 05/07/2010  . HYPERTENSION 07/30/2009  . HYPOTHYROIDISM 07/30/2009  . 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 PTSD (post-traumatic stress disorder) 03/10/2012  . 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 RA (rheumatoid arthritis) (HCC) 06/11/2011  . RESPIRATORY FAILURE, CHRONIC 07/31/2009  . Scleritis of both eyes 03/17/2014  . THRUSH 11/06/2009  . 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    Past Surgical History:  Procedure Laterality Date  . ABDOMINAL AORTIC ANEURYSM REPAIR    . hx of remote ileum resection due to bleeding    . s/p back surgury   1972   lumbar laminectomy Dr. Sue  . s/p left femoral embolectomy with left leg ischemia     Dr. lawson, vascular  . s/p right ganglion cyst     Dr. Sypher  . small bowel obstruction repair with adhesiolysis      Current Medications:  Prior to Admission medications   Medication Sig Start Date End Date Taking? Authorizing Provider  albuterol (PROAIR HFA) 108 (90 BASE) MCG/ACT inhaler Inhale 2 puffs into the lungs every 4 (four) hours as needed. Shortness of breath 10/09/11  Yes Murali Ramaswamy, MD  albuterol (PROVENTIL) (2.5 MG/3ML) 0.083% nebulizer solution 1 vial in nebulizer every 6 hours as needed Dx 496 05/31/12  Yes Murali Ramaswamy, MD  ALPRAZolam (  XANAX) 0.5 MG tablet Take 0.5 mg by mouth at bedtime. For sleep and anxiety   Yes Historical Provider, MD  atorvastatin (LIPITOR) 40 MG tablet Take 10 mg by mouth daily. 12/23/13  Yes Paula Ross V, MD  Cholecalciferol (VITAMIN D3) 2000 units capsule Take 2,000 Units by mouth daily.  01/11/16  Yes Historical Provider, MD  fluticasone (FLONASE) 50 MCG/ACT nasal spray USE 2 SPRAYS IN EACH NOSTRIL DAILY 05/23/16  Yes Murali Ramaswamy, MD  formoterol  (FORADIL) 12 MCG capsule for inhaler Place 12 mcg into inhaler and inhale daily.  01/11/16  Yes Historical Provider, MD  gabapentin (NEURONTIN) 800 MG tablet Take 800 mg by mouth 2 (two) times daily.     Yes Historical Provider, MD  insulin aspart (NOVOLOG) 100 UNIT/ML injection Inject into the skin 3 (three) times daily with meals. Sliding scale 01/11/16  Yes Historical Provider, MD  insulin glargine (LANTUS) 100 UNIT/ML injection Inject 60 Units into the skin at bedtime.    Yes Historical Provider, MD  leflunomide (ARAVA) 20 MG tablet Take 1 tablet by mouth daily.   Yes Historical Provider, MD  Levothyroxine Sodium (SYNTHROID PO) Take 0.2 mg by mouth every morning.   Yes Historical Provider, MD  Liraglutide (VICTOZA) 18 MG/3ML SOPN 1.2 daily   Yes Historical Provider, MD  losartan (COZAAR) 50 MG tablet Take 1 tablet (50 mg total) by mouth daily. 08/24/13  Yes Preetha Joseph, MD  metFORMIN (GLUCOPHAGE) 1000 MG tablet Take 1,000 mg by mouth 2 (two) times daily with a meal.   Yes Historical Provider, MD  mometasone (ASMANEX 60 METERED DOSES) 220 MCG/INH inhaler Inhale 2 puffs into the lungs daily.     Yes Historical Provider, MD  Multiple Vitamins-Minerals (MEGA MULTIVITAMIN FOR MEN PO) Take by mouth daily.     Yes Historical Provider, MD  Olodaterol HCl (STRIVERDI RESPIMAT) 2.5 MCG/ACT AERS Inhale 2.5 Inhalers into the lungs 1 day or 1 dose. 2 inhalations once a day   Yes Historical Provider, MD  OXYGEN Inhale 2 L into the lungs continuous.   Yes Historical Provider, MD  predniSONE (DELTASONE) 10 MG tablet Take 1 tablet by mouth daily.   Yes Historical Provider, MD  sertraline (ZOLOFT) 100 MG tablet 100 mg. Take 1/2 by mouth daily as needed   Yes Historical Provider, MD  Tamsulosin HCl (FLOMAX) 0.4 MG CAPS Take by mouth at bedtime.     Yes Historical Provider, MD  tiotropium (SPIRIVA HANDIHALER) 18 MCG inhalation capsule Place 18 mcg into inhaler and inhale daily.     Yes Historical Provider, MD    traMADol (ULTRAM) 50 MG tablet Take 1 tablet (50 mg total) by mouth every 8 (eight) hours as needed. 05/23/16  Yes Dakota W John, MD   Allergies:   Patient has no known allergies.   Social History   Social History  . Marital status: Married    Spouse name: N/A  . Number of children: N/A  . Years of education: N/A   Occupational History  . disabled veteran, [employer] Retired   Social History Main Topics  . Smoking status: Former Smoker    Packs/day: 2.50    Years: 40.00    Types: Cigarettes, Pipe, Cigars    Quit date: 07/28/1998  . Smokeless tobacco: Never Used     Comment: quit in 2000 x 40 yrs. 2.5 ppd  . Alcohol use No  . Drug use: No  . Sexual activity: Not on file   Other Topics Concern  .   Not on file   Social History Narrative   Lives in the home with his wife   [facility]   Denies asbestos exposure     Family History:  The patient's family history includes Other in his mother.   ROS:   Please see the history of present illness.    ROS All other systems reviewed and are negative.   PHYSICAL EXAM:   VS:  BP (!) 190/90   Pulse 81   Ht 6' 3" (1.905 m)   Wt 228 lb 12.8 oz (103.8 kg)   SpO2 94% Comment: on 2 L  BMI 28.60 kg/m    GEN: Well nourished, well developed, in no acute distress  HEENT: normal  Neck: no JVD, carotid bruits, or masses Cardiac: RRR; no murmurs, rubs, or gallops,no edema  Respiratory:  clear to auscultation bilaterally, normal work of breathing GI: soft, nontender, nondistended, + BS MS: no deformity or atrophy  Skin: warm and dry, no rash Neuro:  Alert and Oriented x 3, Strength and sensation are intact Psych: euthymic mood, full affect  Wt Readings from Last 3 Encounters:  09/05/16 228 lb 12.8 oz (103.8 kg)  09/04/16 231 lb (104.8 kg)  07/10/16 234 lb (106.1 kg)      Studies/Labs Reviewed:   EKG:  EKG is not ordered today.    Recent Labs: 10/02/2015: TSH 0.45 07/10/2016: ALT 22; BUN 15;  Creatinine, Ser 1.01; Hemoglobin 12.0; Platelets 164.0; Potassium 4.7; Sodium 138   Lipid Panel    Component Value Date/Time   CHOL 145 04/04/2016 1047   TRIG 173.0 (H) 04/04/2016 1047   HDL 30.20 (L) 04/04/2016 1047   CHOLHDL 5 04/04/2016 1047   VLDL 34.6 04/04/2016 1047   LDLCALC 80 04/04/2016 1047   LDLDIRECT 66.0 10/02/2015 0942    Additional studies/ records that were reviewed today include:   Echocardiogram: 08/28/16 LV EF: 35% -   40%  ------------------------------------------------------------------- Indications:      Syncope (R55).  ------------------------------------------------------------------- History:   PMH:   Dyspnea.  Coronary artery disease.  Chronic obstructive pulmonary disease.  Risk factors:  Abdominal Aortic Aneurysm with Repair, Pulmonary Fibrosis with Chronic Respiratory Failure. Former tobacco use. Hypertension. Diabetes mellitus.  ------------------------------------------------------------------- Study Conclusions  - Left ventricle: The cavity size was mildly dilated. Wall   thickness was normal. Systolic function was moderately reduced.   The estimated ejection fraction was in the range of 35% to 40%.   Moderate diffuse hypokinesis. Possible hypokinesis of the   inferolateral and inferior myocardium. There was fusion of early   and atrial contributions to ventricular filling. - Ventricular septum: Septal motion showed abnormal function,   dyssynergy, and paradox. These changes are consistent with a left   bundle branch block.  Impressions:  - The decrease in left ventricular systolic function appears to be   primarily secondary to severe LBBB-related dyssynchrony. There   may also be regional hypokinesis in the distribution of the right   coronary artery. There is E-A fusion and e&'-a&' fusion due to   first degree AV block and relative tachycardia. During post-PVC   pauses the mitral inflow pattern is of impaired relaxation,    suggesting normal mean left atrial pressure.  Cath 12/2001 FINAL IMPRESSION: 1. Inferior apical hypokinesis, ejection fraction of 50%. 2. A 70% lesion in a diagonal that is 1.5 mm in size. 3. 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 There is    no significant flow-limiting lesions   noted. He will continue with    medical management.  ASSESSMENT & PLAN:    1. Chronic systolic CHF - EF of 35-40% on recent echo. No prior echo or stress test for further evaluation. Prior Cath report as above.   - I have discussed with Dr. Ross yesterday and today on phone.  His cardiomyopathy is due to LBBB or new ischemic cardiomyopathy. Given atypical chest pain that radiating down to L arm and shoulder will schedule for L & R cath. Has has chronic orthopnea, likely due to copd. Euvolemic on exam.   2. Syncope - Prodrome of dizziness. Uncertain etiology. Negative orthostatic. Does complains of dizziness with bending. Previously felt dizzy with head movement. Consider neurological evaluation if negative ischemic work up. He dose have resting tremors.   3. HTN - Elevated today. However did not took his medication this morning. Advised to keep long.     Medication Adjustments/Labs and Tests Ordered: Current medicines are reviewed at length with the patient today.  Concerns regarding medicines are outlined above.  Medication changes, Labs and Tests ordered today are listed in the Patient Instructions below. Patient Instructions  Medication Instructions:  Your physician recommends that you continue on your current medications as directed. Please refer to the Current Medication list given to you today.   Labwork: Your physician recommends that you have lab work today" BMET/CBC/PTT/PT/INR   Testing/Procedures:  Your provider has recommended a cardiac catherization  You are scheduled for a cardiac catheterization on Wednesday, February 14 with Dr. McAlhany or associate.  Please  arrive at the [facility] (Main Entrance) at [facility] at [address], [place] -  2nd Floor Short Stay on February 14  at 8:00 am.    Special note: Every effort is made to have your procedure done on time.   Please understand that emergencies sometimes delay a scheduled   procedure.  No food or drink after midnight on Tuesday, February 13.   You may take your morning medications with a sip of water on the day of your procedure.  Medications to HOLD - Hold Metformin 24 hours before, the day of and 48 hours after. 1/2 dose of insulin the night before, hold the am of procedure.  Plan for a one night stay -- bring personal belongings.  Bring a current list of your medications and current insurance cards.  You MUST have a responsible person to drive you home. Someone MUST be with you the first 24 hours after you arrive home or your discharge will be delayed. Wear clothes that are easy to get on and off and wear slip on shoes.    Coronary Angiogram A coronary angiogram, also called coronary angiography, is an X-ray procedure used to look at the arteries in the heart. In this procedure, a dye (contrast dye) is injected through a long, hollow tube (catheter). The catheter is about the size of a piece of cooked spaghetti and is inserted through your groin, wrist, or arm. The dye is injected into each artery, and X-rays are then taken to show if there is a blockage in the arteries of your heart.  LET YOUR HEALTH CARE PROVIDER KNOW ABOUT:  Any allergies you have, including allergies to shellfish or contrast dye.   All medicines you are taking, including vitamins, herbs, eye drops, creams, and over-the-counter medicines.   Previous problems you or members of your family have had with the use of anesthetics.   Any blood disorders you have.     Previous surgeries you have had.  History of kidney problems or failure.   Other medical conditions you have.  RISKS AND  COMPLICATIONS  Generally, a coronary angiogram is a safe procedure. However, about 1 person out of 1000 can have problems that may include:  Allergic reaction to the dye.  Bleeding/bruising from the access site or other locations.  Kidney injury, especially in people with impaired kidney function.  Stroke (rare).  Heart attack (rare).  Irregular rhythms (rare)  Death (rare)  BEFORE THE PROCEDURE   Do not eat or drink anything after midnight the night before the procedure or as directed by your health care provider.   Ask your health care provider about changing or stopping your regular medicines. This is especially important if you are taking diabetes medicines or blood thinners.  PROCEDURE  You may be given a medicine to help you relax (sedative) before the procedure. This medicine is given through an intravenous (IV) access tube that is inserted into one of your veins.   The area where the catheter will be inserted will be washed and shaved. This is usually done in the groin but may be done in the fold of your arm (near your elbow) or in the wrist.   A medicine will be given to numb the area where the catheter will be inserted (local anesthetic).   The health care provider will insert the catheter into an artery. The catheter will be guided by using a special type of X-ray (fluoroscopy) of the blood vessel being examined.   A special dye will then be injected into the catheter, and X-rays will be taken. The dye will help to show where any narrowing or blockages are located in the heart arteries.    AFTER THE PROCEDURE   If the procedure is done through the leg, you will be kept in bed lying flat for several hours. You will be instructed to not bend or cross your legs.  The insertion site will be checked frequently.   The pulse in your feet or wrist will be checked frequently.   Additional blood tests, X-rays, and an electrocardiogram may be done.      Follow-Up: Your physician recommends that you keep your scheduled  follow-up appointment with Dr. Ross.    Any Other Special Instructions Will Be Listed Below (If Applicable).     If you need a refill on your cardiac medications before your next appointment, please call your pharmacy.      Signed, Maurizio Geno, PA  09/05/2016 1:00 PM    [facility] [address], [place], [place]  [address] Phone: [phone]; Fax: [phone]  

## 2016-09-10 NOTE — Care Management Note (Signed)
Case Management Note  Patient Details  Name: KAREL TURPEN MRN: [id number] Date of Birth: 1941/05/23  Subjective/Objective:  S/p coronary stent, will be on plavix and asa, NCM will cont to follow for dc needs.                  Action/Plan:   Expected Discharge Date:                  Expected Discharge Plan:  Home/Self Care  In-House Referral:     Discharge planning Services  CM Consult  Post Acute Care Choice:    Choice offered to:     DME Arranged:    DME Agency:     HH Arranged:    HH Agency:     Status of Service:  In process, will continue to follow  If discussed at Long Length of Stay Meetings, dates discussed:    Additional Comments:  Leone Haven, RN 09/10/2016, 3:10 PM

## 2016-09-10 NOTE — Progress Notes (Signed)
TR BAND REMOVAL  LOCATION:    right radial  DEFLATED PER PROTOCOL:    Yes.    TIME BAND OFF / DRESSING APPLIED:    1515   SITE UPON ARRIVAL:    Level 0  SITE AFTER BAND REMOVAL:    Level 0  CIRCULATION SENSATION AND MOVEMENT:    Within Normal Limits   Yes.    COMMENTS:   Tolerated procedure well 

## 2016-09-10 NOTE — Interval H&P Note (Signed)
History and Physical Interval Note:  09/10/2016 9:36 AM  [name]  has presented today for cardiac cath with the diagnosis of syncope, CAD, chest pain, cardiomyopathy.  The various methods of treatment have been discussed with the patient and family. After consideration of risks, benefits and other options for treatment, the patient has consented to  Procedure(s): Right/Left Heart Cath and Coronary Angiography (N/A) as a surgical intervention .  The patient's history has been reviewed, patient examined, no change in status, stable for surgery.  I have reviewed the patient's chart and labs.  Questions were answered to the patient's satisfaction.   Cath Lab Visit (complete for each Cath Lab visit)  Clinical Evaluation Leading to the Procedure:   ACS: No.  Non-ACS:    Anginal Classification: CCS II  Anti-ischemic medical therapy: No Therapy  Non-Invasive Test Results: No non-invasive testing performed  Prior CABG: No previous CABG          Verne Carrow

## 2016-09-11 DIAGNOSIS — I10 Essential (primary) hypertension: Secondary | ICD-10-CM | POA: Diagnosis not present

## 2016-09-11 DIAGNOSIS — I200 Unstable angina: Secondary | ICD-10-CM

## 2016-09-11 DIAGNOSIS — J449 Chronic obstructive pulmonary disease, unspecified: Secondary | ICD-10-CM | POA: Diagnosis not present

## 2016-09-11 DIAGNOSIS — I447 Left bundle-branch block, unspecified: Secondary | ICD-10-CM

## 2016-09-11 DIAGNOSIS — I25110 Atherosclerotic heart disease of native coronary artery with unstable angina pectoris: Secondary | ICD-10-CM | POA: Diagnosis not present

## 2016-09-11 DIAGNOSIS — J9611 Chronic respiratory failure with hypoxia: Secondary | ICD-10-CM | POA: Diagnosis not present

## 2016-09-11 DIAGNOSIS — E7800 Pure hypercholesterolemia, unspecified: Secondary | ICD-10-CM

## 2016-09-11 DIAGNOSIS — I5043 Acute on chronic combined systolic (congestive) and diastolic (congestive) heart failure: Secondary | ICD-10-CM

## 2016-09-11 DIAGNOSIS — E119 Type 2 diabetes mellitus without complications: Secondary | ICD-10-CM | POA: Diagnosis not present

## 2016-09-11 DIAGNOSIS — I110 Hypertensive heart disease with heart failure: Secondary | ICD-10-CM | POA: Diagnosis not present

## 2016-09-11 DIAGNOSIS — I2541 Coronary artery aneurysm: Secondary | ICD-10-CM | POA: Diagnosis not present

## 2016-09-11 LAB — CBC
HEMATOCRIT: 37.7 % — AB (ref 39.0–52.0)
HEMOGLOBIN: 11.8 g/dL — AB (ref 13.0–17.0)
MCH: 26.3 pg (ref 26.0–34.0)
MCHC: 31.3 g/dL (ref 30.0–36.0)
MCV: 84.2 fL (ref 78.0–100.0)
Platelets: 132 10*3/uL — ABNORMAL LOW (ref 150–400)
RBC: 4.48 MIL/uL (ref 4.22–5.81)
RDW: 15.1 % (ref 11.5–15.5)
WBC: 7.4 10*3/uL (ref 4.0–10.5)

## 2016-09-11 LAB — GLUCOSE, CAPILLARY: GLUCOSE-CAPILLARY: 129 mg/dL — AB (ref 65–99)

## 2016-09-11 LAB — BASIC METABOLIC PANEL
ANION GAP: 8 (ref 5–15)
BUN: 15 mg/dL (ref 6–20)
CALCIUM: 8.9 mg/dL (ref 8.9–10.3)
CO2: 28 mmol/L (ref 22–32)
Chloride: 103 mmol/L (ref 101–111)
Creatinine, Ser: 0.83 mg/dL (ref 0.61–1.24)
GFR calc Af Amer: >60 mL/min (ref >60)
GLUCOSE: 195 mg/dL — AB (ref 65–99)
Potassium: 3.6 mmol/L (ref 3.5–5.1)
Sodium: 139 mmol/L (ref 135–145)

## 2016-09-11 MED ORDER — POTASSIUM CHLORIDE CRYS ER 20 MEQ PO TBCR
20.0000 meq | EXTENDED_RELEASE_TABLET | Freq: Once | ORAL | Status: AC
  Administered 2016-09-11: 10:00:00 20 meq via ORAL
  Filled 2016-09-11: qty 1

## 2016-09-11 MED ORDER — POTASSIUM CHLORIDE CRYS ER 20 MEQ PO TBCR: 20.0000 meq | EXTENDED_RELEASE_TABLET | Freq: Once | ORAL | 11 refills | Status: DC

## 2016-09-11 MED ORDER — CLOPIDOGREL BISULFATE 75 MG PO TABS: 75.0000 mg | ORAL_TABLET | Freq: Every day | ORAL | 11 refills | Status: DC

## 2016-09-11 MED ORDER — ATORVASTATIN CALCIUM 40 MG PO TABS: 40.0000 mg | ORAL_TABLET | Freq: Every day | ORAL | 11 refills | Status: DC

## 2016-09-11 MED ORDER — BISOPROLOL FUMARATE 5 MG PO TABS: 5.0000 mg | ORAL_TABLET | Freq: Every day | ORAL | 11 refills | Status: DC

## 2016-09-11 MED ORDER — FUROSEMIDE 40 MG PO TABS: 40.0000 mg | ORAL_TABLET | Freq: Every day | ORAL | 11 refills | Status: DC

## 2016-09-11 MED ORDER — FUROSEMIDE 40 MG PO TABS
40.0000 mg | ORAL_TABLET | Freq: Every day | ORAL | Status: DC
  Administered 2016-09-11: 10:00:00 40 mg via ORAL
  Filled 2016-09-11: qty 1

## 2016-09-11 MED ORDER — BISOPROLOL FUMARATE 5 MG PO TABS
5.0000 mg | ORAL_TABLET | Freq: Every day | ORAL | Status: DC
  Administered 2016-09-11: 10:00:00 5 mg via ORAL
  Filled 2016-09-11: qty 1

## 2016-09-11 MED ORDER — POTASSIUM CHLORIDE CRYS ER 20 MEQ PO TBCR: 20.0000 meq | EXTENDED_RELEASE_TABLET | Freq: Every day | ORAL | 11 refills | Status: DC

## 2016-09-11 MED FILL — Verapamil HCl IV Soln 2.5 MG/ML: INTRAVENOUS | Qty: 2 | Status: AC

## 2016-09-11 NOTE — Discharge Summary (Signed)
Discharge Summary    Patient ID: [name],  MRN: [id number], DOB/AGE: 75-Aug-1942 75 y.o.  Admit date: 09/10/2016 Discharge date: 09/11/2016   Primary Care Provider: [name] Primary Cardiologist: Dr. Tenny Craw  Discharge Diagnoses    Active Problems:   Diabetes [facility])   Essential hypertension   COPD (chronic obstructive pulmonary disease) (HCC)   RESPIRATORY FAILURE, CHRONIC   LBBB (left bundle branch block)   Hyperlipidemia   Unstable angina (HCC)   Acute on chronic combined systolic and diastolic CHF (congestive heart failure) (HCC)   Allergies No Known Allergies   History of Present Illness     [name] is a 76 y.o. male CAD, chronic LBBB,  COPD and chronic hypoxemic respiratory failurerequiring oxygen (follow by Dr. Marchelle Gearing) , HTN, RA on chronic steroids and recent diagnosis for cardiomyopathy (for syncope evaluation)  presents for further evaluation.   2 heart caths in the past. One in 2000s showed one ulcerated plaque Rx medically.  Second at [place] -->No signif CAD. He was doing well on cardiac stand point when last seen by Dr. Tenny Craw 04/2016.   He has episode of syncope in Dec 2017. He  became dizzy after carrying boxes from one to the other for wife, does tend to have head position change dizziness as well but this was different, had to sit down, but still passed out. No other associated symptoms.   Carotid doppler 08/25/16 showed 1-39% RICA and 40-59% LICA stenosis. Echo showed new depressed EF of 35-40% appears to be primarily secondary to severe LBBB-related dyssynchrony. There may also be regional hypokinesis in the distribution of the right coronary artery.   Here for further evaluation. Previously he did not felt orthostatic. No further episode syncope. He also had one episode in 2015. He feels dizzy with bending down. Intermittent achy/pressure L upper chest pain that radiates to L shoulder or arm. Denies palpations, LE edema, melena or blood  in his stool or urine. He sleeps on recliner chronically due to COPD and chronic respiratory failure.   Given his recent syncope and a newly reduced LVEF (35-40%), he was scheduled for an elective left and right heart catheterization.  Hospital Course     Consultants: None  Mr. Mauriello went for right and left heart catheterization. PTCA/DES x 1 distal RCA.  Diuresed with IV lasix given elevated filling pressures and moderate to severe pulmonary HTN. No complications during the procedure.   Recommendations: He will need DAPT with ASA and Plavix for one year. Given his reduced EF (35-40%), he will discharge on ASA, plavix, losartan, lasix, and bisoprolol.   Patient seen and examined by Dr. Sw[place]and  today and was stable for discharge. All follow up has been arranged.  _____________  Discharge Vitals Blood pressure 138/65, pulse 94, temperature 97.8 F (36.6 C), temperature source Oral, resp. rate (!) 21, height 6\' 3"  (1.905 m), weight 223 lb 1.7 oz (101.2 kg), SpO2 96 %.  Filed Weights   09/10/16 0758 09/11/16 0400  Weight: 226 lb (102.5 kg) 223 lb 1.7 oz (101.2 kg)     Physical Exam       GEN: Well nourished, well developed, in no acute distress  HEENT: normal  Neck: no JVD, carotid bruits, or masses Cardiac: RRR; no murmurs, rubs, or gallops, trace B LE edema  Respiratory:  clear to auscultation bilaterally, normal work of breathing, diminished in bases, respirations unlabored GI: soft, nontender, nondistended, + BS MS: no deformity or atrophy  Skin:  warm and dry, no rash Neuro:  Alert and Oriented x 3, Strength and sensation are intact Psych: euthymic mood, full affect    Labs & Radiologic Studies    CBC  Recent Labs  09/11/16 0137  WBC 7.4  HGB 11.8*  HCT 37.7*  MCV 84.2  PLT 132*   Basic Metabolic Panel  Recent Labs  09/11/16 0137  NA 139  K 3.6  CL 103  CO2 28  GLUCOSE 195*  BUN 15  CREATININE 0.83  CALCIUM 8.9   Liver Function Tests No results  for input(s): AST, ALT, ALKPHOS, BILITOT, PROT, ALBUMIN in the last 72 hours. No results for input(s): LIPASE, AMYLASE in the last 72 hours. Cardiac Enzymes No results for input(s): CKTOTAL, CKMB, CKMBINDEX, TROPONINI in the last 72 hours. BNP Invalid input(s): POCBNP D-Dimer No results for input(s): DDIMER in the last 72 hours. Hemoglobin A1C No results for input(s): HGBA1C in the last 72 hours. Fasting Lipid Panel No results for input(s): CHOL, HDL, LDLCALC, TRIG, CHOLHDL, LDLDIRECT in the last 72 hours. Thyroid Function Tests No results for input(s): TSH, T4TOTAL, T3FREE, THYROIDAB in the last 72 hours.  Invalid input(s): FREET3 _____________  No results found.   Diagnostic Studies/Procedures    Left and Right Heart Catheterization 09/10/16:  Conclusion     Prox RCA to Mid RCA lesion, 30 %stenosed.  A STENT SYNERGY DES 4X16 drug eluting stent was successfully placed.  Mid RCA to Dist RCA lesion, 80 %stenosed.  Post intervention, there is a 0% residual stenosis.  RPDA lesion, 30 %stenosed.  Ost LM to LM lesion, 30 %stenosed.  Ost 2nd Mrg to 2nd Mrg lesion, 30 %stenosed.  Prox Cx lesion, 20 %stenosed.  Mid LAD lesion, 50 %stenosed.  Ost 1st Diag to 1st Diag lesion, 30 %stenosed.   1. 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  2. Moderate non-obstructive disease in the mid LAD 3. Elevated filling pressures 4. Successful PTCA/DES x 1 distal RCA.       Disposition   Pt is being discharged home today in good condition.  Follow-up Plans & Appointments     Follow-up Information    Bhagat,Bhavinkumar, PA Follow up on 09/24/2016.   Specialty:  Cardiology Why:  10:00AM Hospital follow up post-cath and reduced EF Contact information: [address] [place] [address] [phone]          Discharge Instructions    Diet - low sodium heart healthy    Complete by:  As directed    Discharge  instructions    Complete by:  As directed    PLEASE REMEMBER TO BRING ALL OF YOUR MEDICATIONS TO EACH OF YOUR FOLLOW-UP OFFICE VISITS.  PLEASE ATTEND ALL SCHEDULED FOLLOW-UP APPOINTMENTS.   Activity: Increase activity slowly as tolerated. You may shower, but no soaking baths (or swimming) for 1 week. No driving for 24 hours. No lifting over 5 lbs for 1 week. No sexual activity for 1 week.   You May Return to Work: in 1 week (if applicable)  Wound Care: You may wash cath site gently with soap and water. Keep cath site clean and dry. If you notice pain, swelling, bleeding or pus at your cath site, please call [phone].   Increase activity slowly    Complete by:  As directed       Discharge Medications   Current Discharge Medication List    START taking these medications   Details  bisoprolol (  ZEBETA) 5 MG tablet Take 1 tablet (5 mg total) by mouth daily. Qty: 30 tablet, Refills: 11    clopidogrel (PLAVIX) 75 MG tablet Take 1 tablet (75 mg total) by mouth daily with breakfast. Qty: 30 tablet, Refills: 11    furosemide (LASIX) 40 MG tablet Take 1 tablet (40 mg total) by mouth daily. Qty: 30 tablet, Refills: 11    potassium chloride SA (K-DUR,KLOR-CON) 20 MEQ tablet Take 1 tablet (20 mEq total) by mouth once. Qty: 30 tablet, Refills: 11      CONTINUE these medications which have CHANGED   Details  atorvastatin (LIPITOR) 40 MG tablet Take 1 tablet (40 mg total) by mouth daily. Qty: 30 tablet, Refills: 11      CONTINUE these medications which have NOT CHANGED   Details  acetaminophen (TYLENOL) 325 MG tablet Take 325-650 mg by mouth daily as needed for moderate pain or headache.    albuterol (PROAIR HFA) 108 (90 BASE) MCG/ACT inhaler Inhale 2 puffs into the lungs every 4 (four) hours as needed. Shortness of breath Qty: 1 Inhaler, Refills: 6    ALPRAZolam (XANAX) 0.5 MG tablet Take 0.5 mg by mouth at bedtime. For sleep and anxiety    Carboxymethylcellul-Glycerin  (LUBRICATING EYE DROPS OP) Apply 1 drop to eye daily as needed (dry eyes).    Cholecalciferol (VITAMIN D3) 2000 units capsule Take 2,000 Units by mouth daily.     gabapentin (NEURONTIN) 800 MG tablet Take 800 mg by mouth 2 (two) times daily.      insulin aspart (NOVOLOG) 100 UNIT/ML injection Inject 6-8 Units into the skin daily with supper. Sliding scale    insulin glargine (LANTUS) 100 UNIT/ML injection Inject 60 Units into the skin at bedtime.     leflunomide (ARAVA) 20 MG tablet Take 20 mg by mouth daily.     levothyroxine (SYNTHROID, LEVOTHROID) 200 MCG tablet Take 200 mcg by mouth daily before breakfast.    Liraglutide (VICTOZA) 18 MG/3ML SOPN Inject 1.2 mg into the skin at bedtime.     losartan (COZAAR) 50 MG tablet Take 1 tablet (50 mg total) by mouth daily. Qty: 30 tablet, Refills: 0    metFORMIN (GLUCOPHAGE) 1000 MG tablet Take 1,000 mg by mouth 2 (two) times daily with a meal.    mometasone (ASMANEX 60 METERED DOSES) 220 MCG/INH inhaler Inhale 2 puffs into the lungs daily.      Multiple Vitamins-Minerals (MEGA MULTIVITAMIN FOR MEN PO) Take 1 tablet by mouth daily.     Olodaterol HCl (STRIVERDI RESPIMAT) 2.5 MCG/ACT AERS Inhale 2.5 Inhalers into the lungs 2 (two) times daily.     predniSONE (DELTASONE) 10 MG tablet Take 10 mg by mouth daily.     sertraline (ZOLOFT) 100 MG tablet Take 50 mg by mouth daily.     Tamsulosin HCl (FLOMAX) 0.4 MG CAPS Take 0.4 mg by mouth at bedtime.     tiotropium (SPIRIVA HANDIHALER) 18 MCG inhalation capsule Place 18 mcg into inhaler and inhale daily.      traMADol (ULTRAM) 50 MG tablet Take 1 tablet (50 mg total) by mouth every 8 (eight) hours as needed. Qty: 90 tablet, Refills: 2    albuterol (PROVENTIL) (2.5 MG/3ML) 0.083% nebulizer solution 1 vial in nebulizer every 6 hours as needed Dx 496 Qty: 120 mL, Refills: 6    fluticasone (FLONASE) 50 MCG/ACT nasal spray USE 2 SPRAYS IN EACH NOSTRIL DAILY Qty: 16 g, Refills: 2   Associated  Diagnoses: Chronic respiratory failure with hypoxia (HCC); COPD,  severe (HCC); Post-nasal drip    OXYGEN Inhale 2 L into the lungs continuous.         Aspirin prescribed at discharge?  Yes High Intensity Statin Prescribed? (Lipitor 40-80mg  or Crestor 20-40mg ): Yes Beta Blocker Prescribed? Yes For EF <40%, was ACEI/ARB Prescribed? Yes ADP Receptor Inhibitor Prescribed? (i.e. Plavix etc.-Includes Medically Managed Patients): Yes For EF <40%, Aldosterone Inhibitor Prescribed? No: consider starting as OP Was EF assessed during THIS hospitalization? Yes Was Cardiac Rehab II ordered? (Included Medically managed Patients): Yes   Outstanding Labs/Studies   BMP at OP follow up  Duration of Discharge Encounter   Greater than 30 minutes including physician time.  Signed, Dantre Yearwood [place] PA-C 09/11/2016, 8:46 AM Patient seen and examined and history reviewed. Agree with above findings and plan. Patient is doing well today. Breathing is better. Lungs are clear. No radial or antecubital site hematoma. I reviewed with patient findings on cardiac cath. Explained that recent increase in dyspnea probably more related to CHF rather than COPD. Will need to optimize therapy for CHF. Already on losartan. Will add lasix and bisoprolol (selective beta blocker with severe COPD). Explained importance of monitoring daily weight, sodium restriction. Consider adding aldactone on follow up if renal parameters are stable.  Will DC on ASA and Plavix. Consider stopping Plavix in one year.  Will increase lipitor to 40 mg daily. Last LDL 80.   Tyrees Chopin [place], MDFACC 09/11/2016 8:47 AM

## 2016-09-11 NOTE — Progress Notes (Signed)
CARDIAC REHAB PHASE I   PRE:  Rate/Rhythm: 79 SR    BP: sitting 138/65    SaO2: 93 3L  MODE:  Ambulation: 480 ft   POST:  Rate/Rhythm: 102 ST    BP: sitting 159/59     SaO2: 84 3L, up to 91 3L with rest and pursed lip breathing  Pt fairly steady walking but wanted to keep close to wall. He did not have c/o but I had him rest x2 due to SaO2 84-85 3L. Up to 90 3L with rest and pursed lip breathing. This sounds like his baseline. He has a pulse ox at home. He tries to go to the gym and do resistance training and walk on TM when not flu season. Ed completed including HF. Understands importance of Plavix and daily wts and low sodium. He is somewhat interested in CRPII and will send referral to [place]. He did Pulmonary Rehab in the past.  [phone]  Harriet Masson CES, ACSM 09/11/2016 9:23 AM

## 2016-09-11 NOTE — Care Management Note (Signed)
Case Management Note  Patient Details  Name: [name] MRN: [id number] Date of Birth: [birth year]  Subjective/Objective:   S/p coronary stent, will be on plavix and asa, he has a pcp, and medication coverage, pta indep, for dc today,no needs.                 Action/Plan:   Expected Discharge Date:  09/11/16               Expected Discharge Plan:  Home/Self Care  In-House Referral:     Discharge planning Services  CM Consult  Post Acute Care Choice:    Choice offered to:     DME Arranged:    DME Agency:     HH Arranged:    HH Agency:     Status of Service:  Completed, signed off  If discussed at [employer] of Stay Meetings, dates discussed:    Additional Comments:  Leone Haven, RN 09/11/2016, 9:24 AM

## 2016-09-17 ENCOUNTER — Ambulatory Visit (INDEPENDENT_AMBULATORY_CARE_PROVIDER_SITE_OTHER): Payer: Medicare Other | Admitting: Physician Assistant

## 2016-09-17 ENCOUNTER — Encounter: Payer: Self-pay | Admitting: Physician Assistant

## 2016-09-17 VITALS — BP 152/78 | HR 77 | Ht 75.0 in | Wt 229.0 lb

## 2016-09-17 DIAGNOSIS — I2510 Atherosclerotic heart disease of native coronary artery without angina pectoris: Secondary | ICD-10-CM

## 2016-09-17 DIAGNOSIS — I255 Ischemic cardiomyopathy: Secondary | ICD-10-CM

## 2016-09-17 DIAGNOSIS — I5022 Chronic systolic (congestive) heart failure: Secondary | ICD-10-CM

## 2016-09-17 DIAGNOSIS — I447 Left bundle-branch block, unspecified: Secondary | ICD-10-CM

## 2016-09-17 DIAGNOSIS — I5043 Acute on chronic combined systolic (congestive) and diastolic (congestive) heart failure: Secondary | ICD-10-CM

## 2016-09-17 DIAGNOSIS — I10 Essential (primary) hypertension: Secondary | ICD-10-CM | POA: Diagnosis not present

## 2016-09-17 DIAGNOSIS — E785 Hyperlipidemia, unspecified: Secondary | ICD-10-CM

## 2016-09-17 LAB — BASIC METABOLIC PANEL
BUN/Creatinine Ratio: 22 (ref 10–24)
BUN: 21 mg/dL (ref 8–27)
CHLORIDE: 99 mmol/L (ref 96–106)
CO2: 25 mmol/L (ref 18–29)
Calcium: 9.1 mg/dL (ref 8.6–10.2)
Creatinine, Ser: 0.96 mg/dL (ref 0.76–1.27)
GFR calc non Af Amer: 77 (ref >59)
GFR, EST AFRICAN AMERICAN: 89 (ref >59)
Glucose: 270 mg/dL — ABNORMAL HIGH (ref 65–99)
Potassium: 4.5 mmol/L (ref 3.5–5.2)
Sodium: 138 mmol/L (ref 134–144)

## 2016-09-17 MED ORDER — ASPIRIN EC 81 MG PO TBEC: 81.0000 mg | DELAYED_RELEASE_TABLET | Freq: Every day | ORAL | Status: DC

## 2016-09-17 NOTE — Patient Instructions (Signed)
Medication Instructions:  Your physician has recommended you make the following change in your medication:  START Aspirin 81mg  daily   Labwork: Bmet today  Testing/Procedures: None ordered  Follow-Up: Follow up as planned with Dr.Ross in March 2018  Any Other Special Instructions Will Be Listed Below (If Applicable). Call the office if you have any reoccurrence of chest pain    If you need a refill on your cardiac medications before your next appointment, please call your pharmacy.

## 2016-09-17 NOTE — Progress Notes (Addendum)
Cardiology Office Note    Date:  09/17/2016   ID:  Maeson, Purohit 1940-11-20, MRN [id number]  PCP:  [name], MD  Cardiologist:  Dr. Tenny Craw  Chief Complaint: Hospital follow up s/p  Cath   History of Present Illness:   RONDRICK BARREIRA is a 76 y.o. male CAD, chronic LBBB,  COPD and chronic hypoxemic respiratory failurerequiring oxygen (follow by Dr. Marchelle Gearing) , HTN, RA on chronic steroids and recent diagnosis for cardiomyopathy (for syncope evaluation) presents for follow up after cath.   2 heart caths in the past. One in 2000s showed one ulcerated plaque Rx medically.Second at [place] -->No signif CAD. He was doing well on cardiac stand point when last seen by Dr. Tenny Craw 04/2016.   He has episode of syncope in Dec 2017. He  became dizzy after carrying boxes from one to the other for wife, does tend to have head position change dizziness as well but this was different, had to sit down, but still passed out. No other associated symptoms.   Carotid doppler 08/25/16 showed 1-39% RICA and 40-59% LICA stenosis. Echo showed new depressed EF of 35-40% appears to be primarily secondary to severe LBBB-related dyssynchrony. There may also be regional hypokinesis in the distribution of the right coronary artery.   He was seen by me 09/05/16 for new cardiomyopathy. Discussed with Dr. Tenny Craw.  His cardiomyopathy is due to LBBB or new ischemic cardiomyopathy. 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 S/p PTCA/DES x1 distal RCA. He dose have moderate non - obstructive disease in mid LAD. He was treated with IV lasix for elevated filling pressure.   Presents for follow up. He hasn't started taking aspirin yet. States that he hasn't get out of the house since stress. Resolved chest pain. Stable shortness of breath and orthopnea. No lower extremity  edema, syncope, dizziness, palpitation, melena or blood in his stool or urine.  Past Medical History:  Diagnosis Date  . Anxiety   . BENIGN PROSTATIC HYPERTROPHY 11/23/2009  . C O P D 07/30/2009  . Cardiomyopathy (HCC) 08/28/2016  . CHRONIC OBSTRUCTIVE PULMONARY DISEASE, ACUTE EXACERBATION 03/01/2010  . CORONARY ARTERY DISEASE 11/23/2009  . 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 DEGENERATIVE JOINT DISEASE 11/23/2009  . DEPRESSION 11/23/2009  . DYSPNEA/SHORTNESS OF BREATH 12/08/2009  . FATIGUE 11/23/2009  . GAIT DISTURBANCE 12/10/2009  . HEMOPTYSIS UNSPECIFIED 05/07/2010  . High cholesterol   . HYPERTENSION 07/30/2009  . HYPOTHYROIDISM 07/30/2009  . 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 On home oxygen therapy    "2-3L; 24/7" (09/10/2016)  . OSA on CPAP   . Pneumonia X 2  . PTSD (post-traumatic stress disorder) 03/10/2012  . 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 RA (rheumatoid arthritis) (HCC) 06/11/2011   "qwhere" (09/10/2016)  . RESPIRATORY FAILURE, CHRONIC 07/31/2009  . Scleritis of both eyes 03/17/2014  . THRUSH 11/06/2009  . 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 Type II diabetes mellitus (HCC)     Past Surgical History:  Procedure Laterality Date  . 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 ABDOMINAL EXPLORATION SURGERY  02/2004   w/LOA/notes 12/10/2010; small bowel obstruction repair with adhesiolysis   . BACK SURGERY    . CARDIAC CATHETERIZATION     2 heart caths in the past.  One in 2000s showed one ulcerated plaque  Rx  medically; Second at [facility] [name] 09/05/2016  . CATARACT EXTRACTION W/ INTRAOCULAR LENS  IMPLANT, BILATERAL Bilateral 2000s  . COLECTOMY     hx of remote ileum resection due to bleeding  . CORONARY ANGIOPLASTY WITH STENT PLACEMENT  09/10/2016  . CORONARY STENT INTERVENTION N/A 09/10/2016   Procedure: Coronary Stent Intervention;  Surgeon: Kathleene Hazel, MD;  Location: [facility] INVASIVE CV LAB;  Service: Cardiovascular;  Laterality: N/A;  Distal RCA 4.0x16 Synergy  .  FEMORAL EMBOLOECTOMY Left 07/2000   with left leg ischemia; Dr. Hart Rochester, vascular  . GANGLION CYST EXCISION Right    "wrist"; Dr. Teressa Senter  . LUMBAR LAMINECTOMY  1972   Dr. Fannie Knee  . RIGHT/LEFT HEART CATH AND CORONARY ANGIOGRAPHY N/A 09/10/2016   Procedure: Right/Left Heart Cath and Coronary Angiography;  Surgeon: Kathleene Hazel, MD;  Location: [facility] INVASIVE CV LAB;  Service: Cardiovascular;  Laterality: N/A;  . TONSILLECTOMY      Current Medications: Prior to Admission medications   Medication Sig Start Date End Date Taking? Authorizing Provider  acetaminophen (TYLENOL) 325 MG tablet Take 325-650 mg by mouth daily as needed for moderate pain or headache.    Historical Provider, MD  albuterol (PROAIR HFA) 108 (90 BASE) MCG/ACT inhaler Inhale 2 puffs into the lungs every 4 (four) hours as needed. Shortness of breath 10/09/11   Kalman Shan, MD  albuterol (PROVENTIL) (2.5 MG/3ML) 0.083% nebulizer solution 1 vial in nebulizer every 6 hours as needed Dx 496 05/31/12   Kalman Shan, MD  ALPRAZolam Prudy Feeler) 0.5 MG tablet Take 0.5 mg by mouth at bedtime. For sleep and anxiety    Historical Provider, MD  atorvastatin (LIPITOR) 40 MG tablet Take 1 tablet (40 mg total) by mouth daily. 09/11/16   Ellsworth Lennox, PA  bisoprolol (ZEBETA) 5 MG tablet Take 1 tablet (5 mg total) by mouth daily. 09/11/16   Ellsworth Lennox, PA  Carboxymethylcellul-Glycerin (LUBRICATING EYE DROPS OP) Apply 1 drop to eye daily as needed (dry eyes).    Historical Provider, MD  Cholecalciferol (VITAMIN D3) 2000 units capsule Take 2,000 Units by mouth daily.  01/11/16   Historical Provider, MD  clopidogrel (PLAVIX) 75 MG tablet Take 1 tablet (75 mg total) by mouth daily with breakfast. 09/11/16   Ellsworth Lennox, PA  fluticasone (FLONASE) 50 MCG/ACT nasal spray USE 2 SPRAYS IN EACH NOSTRIL DAILY Patient taking differently: USE 2 SPRAYS IN EACH NOSTRIL DAILY AS NEEDED FOR CONGESTION 05/23/16   Kalman Shan, MD    furosemide (LASIX) 40 MG tablet Take 1 tablet (40 mg total) by mouth daily. 09/11/16   Ellsworth Lennox, PA  gabapentin (NEURONTIN) 800 MG tablet Take 800 mg by mouth 2 (two) times daily.      Historical Provider, MD  insulin aspart (NOVOLOG) 100 UNIT/ML injection Inject 6-8 Units into the skin daily with supper. Sliding scale 01/11/16   Historical Provider, MD  insulin glargine (LANTUS) 100 UNIT/ML injection Inject 60 Units into the skin at bedtime.     Historical Provider, MD  leflunomide (ARAVA) 20 MG tablet Take 20 mg by mouth daily.     Historical Provider, MD  levothyroxine (SYNTHROID, LEVOTHROID) 200 MCG tablet Take 200 mcg by mouth daily before breakfast.    Historical Provider, MD  Liraglutide (VICTOZA) 18 MG/3ML SOPN Inject 1.2 mg into the skin at bedtime.     Historical Provider, MD  losartan (COZAAR) 50 MG tablet Take 1 tablet (50 mg total) by mouth daily. 08/24/13   Zannie Cove, MD  metFORMIN (GLUCOPHAGE) 1000 MG tablet Take 1,000 mg by mouth 2 (two) times daily with a meal.    Historical Provider, MD  mometasone (ASMANEX 60 METERED DOSES) 220 MCG/INH inhaler Inhale 2 puffs into the lungs daily.      Historical Provider, MD  Multiple Vitamins-Minerals (MEGA MULTIVITAMIN FOR MEN PO) Take 1 tablet by mouth daily.     Historical Provider, MD  Olodaterol HCl (STRIVERDI RESPIMAT) 2.5 MCG/ACT AERS Inhale 2.5 Inhalers into the lungs 2 (two) times daily.     Historical Provider, MD  OXYGEN Inhale 2 L into the lungs continuous.    Historical Provider, MD  potassium chloride SA (K-DUR,KLOR-CON) 20 MEQ tablet Take 1 tablet (20 mEq total) by mouth daily. 09/11/16   Ellsworth Lennox, PA  predniSONE (DELTASONE) 10 MG tablet Take 10 mg by mouth daily.     Historical Provider, MD  sertraline (ZOLOFT) 100 MG tablet Take 50 mg by mouth daily.     Historical Provider, MD  Tamsulosin HCl (FLOMAX) 0.4 MG CAPS Take 0.4 mg by mouth at bedtime.     Historical Provider, MD  tiotropium (SPIRIVA HANDIHALER)  18 MCG inhalation capsule Place 18 mcg into inhaler and inhale daily.      Historical Provider, MD  traMADol (ULTRAM) 50 MG tablet Take 1 tablet (50 mg total) by mouth every 8 (eight) hours as needed. Patient taking differently: Take 50 mg by mouth every 8 (eight) hours as needed for moderate pain.  05/23/16   Corwin Levins, MD    Allergies:   Patient has no known allergies.   Social History   Social History  . Marital status: Married    Spouse name: N/A  . Number of children: N/A  . Years of education: N/A   Occupational History  . disabled veteran, Ex Human resources officer Retired   Social History Main Topics  . Smoking status: Former Smoker    Packs/day: 2.50    Years: 40.00    Types: Cigarettes, Pipe, Cigars    Quit date: 07/28/1998  . Smokeless tobacco: Never Used  . Alcohol use No  . Drug use: No  . Sexual activity: Not Asked   Other Topics Concern  . None   Social History Narrative   Lives in the home with his wife   Sees [place] every 6 month for meds.   Denies asbestos exposure     Family History:  The patient's family history includes Other in his mother.   ROS:   Please see the history of present illness.    ROS All other systems reviewed and are negative.   PHYSICAL EXAM:   VS:  BP (!) 152/78   Pulse 77   Ht 6\' 3"  (1.905 m)   Wt 229 lb (103.9 kg)   BMI 28.62 kg/m    GEN: Well nourished, well developed, in no acute distress  HEENT: normal  Neck: no JVD, carotid bruits, or masses Cardiac: RRR; no murmurs, rubs, or gallops,no edema  Respiratory:  clear to auscultation bilaterally, normal work of breathing GI: soft, nontender, nondistended, + BS MS: no deformity or atrophy  Skin: warm and dry, no rash Neuro:  Alert and Oriented x 3, Strength and sensation are intact Psych: euthymic mood, full affect  Wt Readings from Last 3 Encounters:  09/17/16 229 lb (103.9 kg)  09/11/16 223 lb 1.7 oz (101.2 kg)  09/05/16 228 lb 12.8 oz (103.8 kg)      Studies/Labs  Reviewed:   EKG:  EKG  is ordered today.  The ekg ordered today demonstrates Normal sinus rhythm with chronic left bundle branch block. New T-wave inversion in inferior lead.  Recent Labs: 10/02/2015: TSH 0.45 07/10/2016: ALT 22 09/11/2016: BUN 15; Creatinine, Ser 0.83; Hemoglobin 11.8; Platelets 132; Potassium 3.6; Sodium 139   Lipid Panel    Component Value Date/Time   CHOL 145 04/04/2016 1047   TRIG 173.0 (H) 04/04/2016 1047   HDL 30.20 (L) 04/04/2016 1047   CHOLHDL 5 04/04/2016 1047   VLDL 34.6 04/04/2016 1047   LDLCALC 80 04/04/2016 1047   LDLDIRECT 66.0 10/02/2015 0942    Additional studies/ records that were reviewed today include:   Echocardiogram: 08/28/16 LV EF: 35% -   40%  ------------------------------------------------------------------- Indications:      Syncope (R55).  ------------------------------------------------------------------- History:   PMH:   Dyspnea.  Coronary artery disease.  Chronic obstructive pulmonary disease.  Risk factors:  Abdominal Aortic Aneurysm with Repair, Pulmonary Fibrosis with Chronic Respiratory Failure. Former tobacco use. Hypertension. Diabetes mellitus.  ------------------------------------------------------------------- Study Conclusions  - Left ventricle: The cavity size was mildly dilated. Wall   thickness was normal. Systolic function was moderately reduced.   The estimated ejection fraction was in the range of 35% to 40%.   Moderate diffuse hypokinesis. Possible hypokinesis of the   inferolateral and inferior myocardium. There was fusion of early   and atrial contributions to ventricular filling. - Ventricular septum: Septal motion showed abnormal function,   dyssynergy, and paradox. These changes are consistent with a left   bundle branch block.  Impressions:  - The decrease in left ventricular systolic function appears to be   primarily secondary to severe LBBB-related dyssynchrony. There   may also be regional  hypokinesis in the distribution of the right   coronary artery. There is E-A fusion and e&'-a&' fusion due to   first degree AV block and relative tachycardia. During post-PVC   pauses the mitral inflow pattern is of impaired relaxation,   suggesting normal mean left atrial pressure.  Coronary Stent Intervention   09/10/16  Right/Left Heart Cath and Coronary Angiography  Conclusion     Prox RCA to Mid RCA lesion, 30 %stenosed.  A STENT SYNERGY DES 4X16 drug eluting stent was successfully placed.  Mid RCA to Dist RCA lesion, 80 %stenosed.  Post intervention, there is a 0% residual stenosis.  RPDA lesion, 30 %stenosed.  Ost LM to LM lesion, 30 %stenosed.  Ost 2nd Mrg to 2nd Mrg lesion, 30 %stenosed.  Prox Cx lesion, 20 %stenosed.  Mid LAD lesion, 50 %stenosed.  Ost 1st Diag to 1st Diag lesion, 30 %stenosed.   1. 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  2. Moderate non-obstructive disease in the mid LAD 3. Elevated filling pressures 4. Successful PTCA/DES x 1 distal RCA.   Recommendations: He will need DAPT with ASA and Plavix for one year. Will monitor tonight and d/c home in the am. Will diurese with IV Lasix tonight given elevated filling pressures.     Cath 12/2001 FINAL IMPRESSION: 1. Inferior apical hypokinesis, ejection fraction of 50%. 2. A 70% lesion in a diagonal that is 1.5 mm in size. 3. 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 There is no significant flow-limiting lesions noted. He will continue with medical management.    ASSESSMENT & PLAN:    1. CAD s/p DES to distal RCA - Moderate non obstructive disease in LAD. He hasn't srated aspirin 81 mg. Discuss importance of  compliance with aspirin and Plavix. He will start taking ASA81mg , samples given. New T-wave inversion in inferior lead however his chest pain has been resolved. He will let us know if reoccurrence of chest pain. He  is agree with plan. CRP II as outpatient. Continue plavix and statin.    2. ICM/Chronic systolic CHF - EF of 35-40%. Treated with IV lasix of elevated filling pressure in hospital.  He is euvolemic on exam. - Continue ARB and bisoprolol (selective BB with severe COPD).  - Will add spironolactone and stop if renal function stable. Will need repeat echo in 3 months.  - Heart failure education given.   3. HLD - 04/04/2016: Cholesterol 145; HDL 30.20; LDL Cholesterol 80; Triglycerides 173.0; VLDL 34.6  - LDL goal less than 70. Lipitor increased to 40mg  daily in hospital. Will need lipid panel and LFT during next OV.   4. Essential Hypertension  - Elevated today. He hasn't took any medication today. Advised to keep log. IF constantly able 140/90, he will let us know.   Medication Adjustments/Labs and Tests Ordered: Current medicines are reviewed at length with the patient today.  Concerns regarding medicines are outlined above.  Medication changes, Labs and Tests ordered today are listed in the Patient Instructions below. Patient Instructions  Medication Instructions:  Your physician has recommended you make the following change in your medication:  START Aspirin 81mg  daily   Labwork: Bmet today  Testing/Procedures: None ordered  Follow-Up: Follow up as planned with Dr.Ross in March 2018  Any Other Special Instructions Will Be Listed Below (If Applicable). Call the office if you have any reoccurrence of chest pain    If you need a refill on your cardiac medications before your next appointment, please call your pharmacy.      Lorelei Pont, [place]  09/17/2016 11:06 AM    [facility] [address] [place], [place], [place]  [address] Phone: [phone]; Fax: [phone]

## 2016-09-18 ENCOUNTER — Telehealth: Payer: Self-pay | Admitting: *Deleted

## 2016-09-18 DIAGNOSIS — Z79899 Other long term (current) drug therapy: Secondary | ICD-10-CM

## 2016-09-18 MED ORDER — SPIRONOLACTONE 25 MG PO TABS: 25.0000 mg | ORAL_TABLET | Freq: Every day | ORAL | 3 refills | Status: DC

## 2016-09-18 NOTE — Telephone Encounter (Signed)
-----   Message from [place], [place] sent at 09/18/2016  9:01 AM EST ----- Normal electrolyte and kidney function. Stop Lasix and Kdur. Stat Spironolactone 25mg  qd. Repeat BMET in 1 week.   #Weigh yourself on the same scale at same time of day and keep a log. #Report weight gain of > 3 lbs in 1 day or 5 lbs over the course of a week and/or symptoms of excess fluid (shortness of breath, difficulty lying flat, swelling, poor appetite, abdominal fullness/bloating, etc) to your doctor immediately. #Avoid foods that are high in sodium (processed, pre-packaged/canned goods, fast foods, etc). #Please attend all scheduled and reccommended follow up appointments

## 2016-09-18 NOTE — Telephone Encounter (Signed)
Pt aware of his lab results.  He will d/c Lasix & K+. He will start Spironolactone 25 qd and repeat bmet 09/25/16. Orders in EPIC.

## 2016-09-24 ENCOUNTER — Telehealth (HOSPITAL_COMMUNITY): Payer: Self-pay | Admitting: Internal Medicine

## 2016-09-24 ENCOUNTER — Ambulatory Visit: Payer: Medicare Other | Admitting: Physician Assistant

## 2016-09-24 NOTE — Telephone Encounter (Signed)
Verified insurance benefits through Medicare A/B primary, BCBS Supplement Secondary, Follow Medicare Guidelines....  Reference # [id number]... KJ

## 2016-09-25 ENCOUNTER — Other Ambulatory Visit: Payer: Medicare Other

## 2016-09-25 ENCOUNTER — Other Ambulatory Visit: Payer: Self-pay | Admitting: Internal Medicine

## 2016-09-25 NOTE — Telephone Encounter (Signed)
Done hardcopy to Anna  

## 2016-09-25 NOTE — Telephone Encounter (Signed)
Faxed script back to [facility].../lmb 

## 2016-09-26 ENCOUNTER — Other Ambulatory Visit: Payer: Medicare Other | Admitting: *Deleted

## 2016-09-26 DIAGNOSIS — Z79899 Other long term (current) drug therapy: Secondary | ICD-10-CM

## 2016-09-26 LAB — BASIC METABOLIC PANEL
BUN / CREAT RATIO: 20 (ref 10–24)
BUN: 17 mg/dL (ref 8–27)
CALCIUM: 9 mg/dL (ref 8.6–10.2)
CHLORIDE: 93 mmol/L — AB (ref 96–106)
CO2: 26 mmol/L (ref 18–29)
CREATININE: 0.85 mg/dL (ref 0.76–1.27)
GFR, EST AFRICAN AMERICAN: 99 mL/min/{1.73_m2} (ref >59)
GFR, EST NON AFRICAN AMERICAN: 85 mL/min/{1.73_m2} (ref >59)
Glucose: 336 mg/dL — ABNORMAL HIGH (ref 65–99)
Potassium: 4.1 mmol/L (ref 3.5–5.2)
Sodium: 138 mmol/L (ref 134–144)

## 2016-10-02 ENCOUNTER — Telehealth: Payer: Self-pay | Admitting: *Deleted

## 2016-10-02 ENCOUNTER — Ambulatory Visit (INDEPENDENT_AMBULATORY_CARE_PROVIDER_SITE_OTHER): Payer: Medicare Other | Admitting: Internal Medicine

## 2016-10-02 ENCOUNTER — Encounter: Payer: Self-pay | Admitting: Internal Medicine

## 2016-10-02 ENCOUNTER — Other Ambulatory Visit (INDEPENDENT_AMBULATORY_CARE_PROVIDER_SITE_OTHER): Payer: Medicare Other

## 2016-10-02 VITALS — BP 162/92 | HR 86 | Temp 97.6°F | Ht 75.0 in | Wt 229.0 lb

## 2016-10-02 DIAGNOSIS — I10 Essential (primary) hypertension: Secondary | ICD-10-CM

## 2016-10-02 DIAGNOSIS — J441 Chronic obstructive pulmonary disease with (acute) exacerbation: Secondary | ICD-10-CM

## 2016-10-02 DIAGNOSIS — Z0000 Encounter for general adult medical examination without abnormal findings: Secondary | ICD-10-CM

## 2016-10-02 DIAGNOSIS — E119 Type 2 diabetes mellitus without complications: Secondary | ICD-10-CM

## 2016-10-02 DIAGNOSIS — R7989 Other specified abnormal findings of blood chemistry: Secondary | ICD-10-CM

## 2016-10-02 LAB — URINALYSIS, ROUTINE W REFLEX MICROSCOPIC
Bilirubin Urine: NEGATIVE
Hgb urine dipstick: NEGATIVE
Ketones, ur: NEGATIVE
Leukocytes, UA: NEGATIVE
Nitrite: NEGATIVE
PH: 6 (ref 5.0–8.0)
TOTAL PROTEIN, URINE-UPE24: NEGATIVE
URINE GLUCOSE: 100 — AB
Urobilinogen, UA: 0.2 (ref 0.0–1.0)

## 2016-10-02 LAB — BASIC METABOLIC PANEL
BUN: 23 mg/dL (ref 6–23)
CALCIUM: 9.6 mg/dL (ref 8.4–10.5)
CO2: 33 mEq/L — ABNORMAL HIGH (ref 19–32)
Chloride: 101 mEq/L (ref 96–112)
Creatinine, Ser: 0.98 mg/dL (ref 0.40–1.50)
GFR: 79.23 mL/min (ref >60.00)
Glucose, Bld: 233 mg/dL — ABNORMAL HIGH (ref 70–99)
Potassium: 4.5 mEq/L (ref 3.5–5.1)
SODIUM: 139 meq/L (ref 135–145)

## 2016-10-02 LAB — HEPATIC FUNCTION PANEL
ALK PHOS: 60 U/L (ref 39–117)
ALT: 16 U/L (ref 0–53)
AST: 14 U/L (ref 0–37)
Albumin: 4 g/dL (ref 3.5–5.2)
Bilirubin, Direct: 0.1 mg/dL (ref 0.0–0.3)
TOTAL PROTEIN: 6.4 g/dL (ref 6.0–8.3)
Total Bilirubin: 0.6 mg/dL (ref 0.2–1.2)

## 2016-10-02 LAB — LIPID PANEL
Cholesterol: 207 mg/dL — ABNORMAL HIGH (ref 0–200)
HDL: 38.5 mg/dL — AB (ref >39.00)
NonHDL: 168.19
TRIGLYCERIDES: 218 mg/dL — AB (ref 0.0–149.0)
Total CHOL/HDL Ratio: 5
VLDL: 43.6 mg/dL — ABNORMAL HIGH (ref 0.0–40.0)

## 2016-10-02 LAB — CBC WITH DIFFERENTIAL/PLATELET
BASOS ABS: 0 10*3/uL (ref 0.0–0.1)
Basophils Relative: 0.5 % (ref 0.0–3.0)
Eosinophils Absolute: 0.1 10*3/uL (ref 0.0–0.7)
Eosinophils Relative: 1.2 % (ref 0.0–5.0)
HCT: 36.9 % — ABNORMAL LOW (ref 39.0–52.0)
Hemoglobin: 12.1 g/dL — ABNORMAL LOW (ref 13.0–17.0)
LYMPHS ABS: 1.5 10*3/uL (ref 0.7–4.0)
Lymphocytes Relative: 20.1 % (ref 12.0–46.0)
MCHC: 32.8 g/dL (ref 30.0–36.0)
MCV: 82.3 fl (ref 78.0–100.0)
MONO ABS: 0.9 10*3/uL (ref 0.1–1.0)
MONOS PCT: 12 % (ref 3.0–12.0)
NEUTROS PCT: 66.2 % (ref 43.0–77.0)
Neutro Abs: 5.1 10*3/uL (ref 1.4–7.7)
Platelets: 177 10*3/uL (ref 150.0–400.0)
RBC: 4.48 Mil/uL (ref 4.22–5.81)
RDW: 15.8 % — ABNORMAL HIGH (ref 11.5–15.5)
WBC: 7.7 10*3/uL (ref 4.0–10.5)

## 2016-10-02 LAB — TSH: TSH: 0.72 u[IU]/mL (ref 0.35–4.50)

## 2016-10-02 LAB — PSA: PSA: 0.36 ng/mL (ref 0.10–4.00)

## 2016-10-02 LAB — HEMOGLOBIN A1C: HEMOGLOBIN A1C: 9.5 % — AB (ref 4.6–6.5)

## 2016-10-02 LAB — MICROALBUMIN / CREATININE URINE RATIO
Creatinine,U: 154.3 mg/dL
MICROALB/CREAT RATIO: 3.7 mg/g (ref 0.0–30.0)
Microalb, Ur: 5.7 mg/dL — ABNORMAL HIGH (ref 0.0–1.9)

## 2016-10-02 LAB — LDL CHOLESTEROL, DIRECT: Direct LDL: 106 mg/dL

## 2016-10-02 MED ORDER — AZITHROMYCIN 250 MG PO TABS: ORAL_TABLET | ORAL | 0 refills | Status: DC

## 2016-10-02 MED ORDER — PREDNISONE 10 MG PO TABS: ORAL_TABLET | ORAL | 5 refills | Status: DC

## 2016-10-02 MED ORDER — PREDNISONE 10 MG PO TABS: ORAL_TABLET | ORAL | 0 refills | Status: DC

## 2016-10-02 NOTE — Patient Instructions (Signed)
Please take all new medication as prescribed - the antibiotic, and prednisone  Please continue all other medications as before, and refills have been done if requested.  Please have the pharmacy call with any other refills you may need.  Please continue your efforts at being more active, low cholesterol diet, and weight control.  You are otherwise up to date with prevention measures today.  Please keep your appointments with your specialists as you may have planned  Please go to the LAB in the Basement (turn left off the elevator) for the tests to be done today  You will be contacted by phone if any changes need to be made immediately.  Otherwise, you will receive a letter about your results with an explanation, but please check with MyChart first.  Please remember to sign up for MyChart if you have not done so, as this will be important to you in the future with finding out test results, communicating by private email, and scheduling acute appointments online when needed.  If you have Medicare related insurance (such as traditional Medicare, [employer] or [employer], or similar), Please make an appointment at the [facility] with Noreene Larsson, the [employer], for your Wellness Visit in this office, which is a benefit with your insurance.  Please return in 6 months, or sooner if needed, with Lab testing done 3-5 days before

## 2016-10-02 NOTE — Progress Notes (Signed)
Subjective:    Patient ID: [name], male    DOB: [birth year], 76 y.o.   MRN: [id number]  HPI    Here for wellness and f/u;  Overall doing ok;  Pt denies neurological change such as new headache, facial or extremity weakness.  Pt denies polydipsia, polyuria, or low sugar symptoms. Pt states overall good compliance with treatment and medications, good tolerability, and has been trying to follow appropriate diet.  Pt denies worsening depressive symptoms, suicidal ideation or panic. No fever, night sweats, wt loss, loss of appetite, or other constitutional symptoms.  Pt states good ability with ADL's, has low fall risk, home safety reviewed and adequate, no other significant changes in hearing or vision, and only occasionally active with exercise.  Declines colonoscopy, but will do cologuard as has been more than 3 yrs after previous with GI.    Also still with recurring dizzy spells despite recent cardiac evaluation.  [facility] there yesterday so asks for referral locally.  Still plans to have DM f/u with endo at [facility] as well.   Did not take BP meds this am  Also Here with acute onset mild to mod 2-3 days ST, HA, general weakness and malaise, with prod cough greenish sputum, but Pt denies chest pain, increased sob or doe, wheezing, orthopnea, PND, increased LE swelling, palpitations, dizziness or syncope. Past Medical History:  Diagnosis Date  . Anxiety   . BENIGN PROSTATIC HYPERTROPHY 11/23/2009  . C O P D 07/30/2009  . Cardiomyopathy (HCC) 08/28/2016  . CHRONIC OBSTRUCTIVE PULMONARY DISEASE, ACUTE EXACERBATION 03/01/2010  . CORONARY ARTERY DISEASE 11/23/2009  . 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 DEGENERATIVE JOINT DISEASE 11/23/2009  . DEPRESSION 11/23/2009  . DYSPNEA/SHORTNESS OF BREATH 12/08/2009  . FATIGUE 11/23/2009  . GAIT DISTURBANCE 12/10/2009  . HEMOPTYSIS UNSPECIFIED 05/07/2010  . High cholesterol   . HYPERTENSION 07/30/2009  .  HYPOTHYROIDISM 07/30/2009  . 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 On home oxygen therapy    "2-3L; 24/7" (09/10/2016)  . OSA on CPAP   . Pneumonia X 2  . PTSD (post-traumatic stress disorder) 03/10/2012  . 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 RA (rheumatoid arthritis) (HCC) 06/11/2011   "qwhere" (09/10/2016)  . RESPIRATORY FAILURE, CHRONIC 07/31/2009  . Scleritis of both eyes 03/17/2014  . THRUSH 11/06/2009  . 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 Type II diabetes mellitus (HCC)    Past Surgical History:  Procedure Laterality Date  . 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 ABDOMINAL EXPLORATION SURGERY  02/2004   w/LOA/notes 12/10/2010; small bowel obstruction repair with adhesiolysis   . BACK SURGERY    . CARDIAC CATHETERIZATION     2 heart caths in the past.  One in 2000s showed one ulcerated plaque  Rx medically; Second at [facility] [name] 09/05/2016  . CATARACT EXTRACTION W/ INTRAOCULAR LENS  IMPLANT, BILATERAL Bilateral 2000s  . COLECTOMY     hx of remote ileum resection due to bleeding  . CORONARY ANGIOPLASTY WITH STENT PLACEMENT  09/10/2016  . CORONARY STENT INTERVENTION N/A 09/10/2016   Procedure: Coronary Stent Intervention;  Surgeon: Kathleene Hazel, MD;  Location: [facility] INVASIVE CV LAB;  Service: Cardiovascular;  Laterality: N/A;  Distal RCA 4.0x16 Synergy  . FEMORAL EMBOLOECTOMY Left 07/2000   with left leg ischemia; Dr. Hart Rochester, vascular  . GANGLION CYST EXCISION Right    "wrist"; Dr. Teressa Senter  . LUMBAR LAMINECTOMY  1972   Dr. Fannie Knee  . RIGHT/LEFT HEART CATH AND CORONARY ANGIOGRAPHY N/A 09/10/2016   Procedure: Right/Left Heart Cath and Coronary Angiography;  Surgeon: Kathleene Hazel, MD;  Location: [facility] INVASIVE CV LAB;  Service: Cardiovascular;  Laterality: N/A;  . TONSILLECTOMY      reports that he quit smoking about 18 years ago. His smoking use included Cigarettes, Pipe, and Cigars. He has a 100.00 pack-year smoking history. He has never used smokeless tobacco. He  reports that he does not drink alcohol or use drugs. family history includes Other in his mother. No Known Allergies Current Outpatient Prescriptions on File Prior to Visit  Medication Sig Dispense Refill  . acetaminophen (TYLENOL) 325 MG tablet Take 325-650 mg by mouth daily as needed for moderate pain or headache.    . albuterol (PROAIR HFA) 108 (90 BASE) MCG/ACT inhaler Inhale 2 puffs into the lungs every 4 (four) hours as needed. Shortness of breath 1 Inhaler 6  . albuterol (PROVENTIL) (2.5 MG/3ML) 0.083% nebulizer solution 1 vial in nebulizer every 6 hours as needed Dx 496 120 mL 6  . ALPRAZolam (XANAX) 0.5 MG tablet Take 0.5 mg by mouth at bedtime. For sleep and anxiety    . aspirin EC 81 MG tablet Take 1 tablet (81 mg total) by mouth daily.    . bisoprolol (ZEBETA) 5 MG tablet Take 1 tablet (5 mg total) by mouth daily. 30 tablet 11  . Carboxymethylcellul-Glycerin (LUBRICATING EYE DROPS OP) Apply 1 drop to eye daily as needed (dry eyes).    . Cholecalciferol (VITAMIN D3) 2000 units capsule Take 2,000 Units by mouth daily.     . clopidogrel (PLAVIX) 75 MG tablet Take 1 tablet (75 mg total) by mouth daily with breakfast. 30 tablet 11  . fluticasone (FLONASE) 50 MCG/ACT nasal spray USE 2 SPRAYS IN EACH NOSTRIL DAILY (Patient taking differently: USE 2 SPRAYS IN EACH NOSTRIL DAILY AS NEEDED FOR CONGESTION) 16 g 2  . gabapentin (NEURONTIN) 800 MG tablet Take 800 mg by mouth 2 (two) times daily.      . insulin aspart (NOVOLOG) 100 UNIT/ML injection Inject 6-8 Units into the skin daily with supper. Sliding scale    . leflunomide (ARAVA) 20 MG tablet Take 20 mg by mouth daily.     [name] levothyroxine (SYNTHROID, LEVOTHROID) 200 MCG tablet Take 200 mcg by mouth daily before breakfast.    . Liraglutide (VICTOZA) 18 MG/3ML SOPN Inject 1.2 mg into the skin at bedtime.     [name] losartan (COZAAR) 50 MG tablet Take 1 tablet (50 mg total) by mouth daily. 30 tablet 0  . metFORMIN (GLUCOPHAGE) 1000 MG tablet Take  1,000 mg by mouth 2 (two) times daily with a meal.    . mometasone (ASMANEX 60 METERED DOSES) 220 MCG/INH inhaler Inhale 2 puffs into the lungs daily.      . Multiple Vitamins-Minerals (MEGA MULTIVITAMIN FOR MEN PO) Take 1 tablet by mouth daily.     . Olodaterol HCl (STRIVERDI RESPIMAT) 2.5 MCG/ACT AERS Inhale 2.5 Inhalers into the lungs 2 (two) times daily.     . OXYGEN Inhale 2 L into the lungs continuous.    . sertraline (ZOLOFT) 100 MG tablet Take 50 mg by mouth daily.     [name] spironolactone (ALDACTONE) 25 MG tablet Take 1 tablet (25 mg total) by mouth daily. 90 tablet 3  . Tamsulosin HCl (FLOMAX) 0.4 MG CAPS Take 0.4 mg by mouth at bedtime.     [name] tiotropium (SPIRIVA HANDIHALER) 18  MCG inhalation capsule Place 18 mcg into inhaler and inhale daily.      . traMADol (ULTRAM) 50 MG tablet TAKE 1 TABLET BY MOUTH EVERY 8 HOURS AS NEEDED 90 tablet 2   No current facility-administered medications on file prior to visit.    Review of Systems Constitutional: Negative for increased diaphoresis, or other activity, appetite or siginficant weight change other than noted HENT: Negative for worsening hearing loss, ear pain, facial swelling, mouth sores and neck stiffness.   Eyes: Negative for other worsening pain, redness or visual disturbance.  Respiratory: Negative for choking or stridor Cardiovascular: Negative for other chest pain and palpitations.  Gastrointestinal: Negative for worsening diarrhea, blood in stool, or abdominal distention Genitourinary: Negative for hematuria, flank pain or change in urine volume.  Musculoskeletal: Negative for myalgias or other joint complaints.  Skin: Negative for other color change and wound or drainage.  Neurological: Negative for syncope and numbness. other than noted Hematological: Negative for adenopathy. or other swelling Psychiatric/Behavioral: Negative for hallucinations, SI, self-injury, decreased concentration or other worsening agitation.  All other  system neg per pt    Objective:   Physical Exam BP (!) 162/92   Pulse 86   Temp 97.6 F (36.4 C)   Ht 6\' 3"  (1.905 m)   Wt 229 lb (103.9 kg)   SpO2 94%   BMI 28.62 kg/m  - on home o2 - 2L [place] VS noted, mild ill Constitutional: Pt is oriented to person, place, and time. Appears well-developed and well-nourished, in no significant distress Head: Normocephalic and atraumatic  Eyes: Conjunctivae and EOM are normal. Pupils are equal, round, and reactive to light Right Ear: External ear normal.  Left Ear: External ear normal Nose: Nose normal.  Bilat tm's with mild erythema.  Max sinus areas non tender.  Pharynx with mild erythema, no exudate Mouth/Throat: Oropharynx is clear and moist  Neck: Normal range of motion. Neck supple. No JVD present. No tracheal deviation present or significant neck LA or mass Cardiovascular: Normal rate, regular rhythm, normal heart sounds and intact distal pulses.   Pulmonary/Chest: Effort normal and breath decreased sounds without rales or wheezing  Abdominal: Soft. Bowel sounds are normal. NT. No HSM  Musculoskeletal: Normal range of motion. Exhibits no edema Lymphadenopathy: Has no cervical adenopathy.  Neurological: Pt is alert and oriented to person, place, and time. Pt has normal reflexes. No cranial nerve deficit. Motor grossly intact Skin: Skin is warm and dry. No rash noted or new ulcers Psychiatric:  Has normal mood and affect. Behavior is normal.  No other exam findings    Assessment & Plan:

## 2016-10-02 NOTE — Telephone Encounter (Signed)
Ok done erx 

## 2016-10-02 NOTE — Telephone Encounter (Signed)
Rec'd fax statingn script sent yesterday for Prednisone 10 mg take 1 pill for 7 days will not go through insurance because of the normal regimen that he is on. MD can re-write script for more (37 tabs) and pt to take 2 tabs for a week, and then resume his regular regimen. It will be $12 for 7 tabs without the insurance...Raechel Chute

## 2016-10-03 ENCOUNTER — Other Ambulatory Visit: Payer: Self-pay | Admitting: Internal Medicine

## 2016-10-03 MED ORDER — INSULIN GLARGINE 100 UNIT/ML ~~LOC~~ SOLN: 70.0000 [IU] | Freq: Every day | SUBCUTANEOUS | 11 refills | Status: DC

## 2016-10-03 MED ORDER — ATORVASTATIN CALCIUM 80 MG PO TABS: 80.0000 mg | ORAL_TABLET | Freq: Every day | ORAL | 3 refills | Status: DC

## 2016-10-04 NOTE — Assessment & Plan Note (Addendum)
Mild to mod, c/w bronchitis vs pna,  for antibx course,  to f/u any worsening symptoms or concerns  In addition to the time spent performing CPE, I spent an additional 15 minutes face to face,in which greater than 50% of this time was spent in counseling and coordination of care for patient's illness as documented.

## 2016-10-04 NOTE — Assessment & Plan Note (Signed)
Mild uncontrolled, to restart meds

## 2016-10-04 NOTE — Assessment & Plan Note (Signed)
?   Control o/w stable overall by history and exam, and pt to continue medical treatment as before,  to f/u any worsening symptoms or concerns

## 2016-10-04 NOTE — Assessment & Plan Note (Signed)

## 2016-10-09 ENCOUNTER — Encounter: Payer: Self-pay | Admitting: Internal Medicine

## 2016-10-18 NOTE — Progress Notes (Signed)
Cardiology Office Note   Date:  10/20/2016   ID:  [name], DOB 1941/04/04, MRN [id number]  PCP:  [name], MD  Cardiologist:   [name], MD    F/U of CAD and chronic systollc CHF     History of Present Illness: [name] is a 76 y.o. male with a history of COPD, coronary calcifications,  I saw him in Oct 2017  Pt had a episode of syncope in December carrying boxes  Doppler with mod dz LICA  Echo showed LVEF 35 to 40%   Seen by B Bhagat Pt set up for L and R hart cath  This showed severe stenosis of RCA  Underwent PTCA/DES to distal RCA  There was mod dz of mid LAD  Filling pressures elevated    Breathing pretty good  Better Still had a couple episdoes of dizzy  usu with change in head position     Had been off lasix  Felt full  Took one lasix yesterday  Urinated a lot  Feeling better    Current Meds  Medication Sig  . acetaminophen (TYLENOL) 325 MG tablet Take 325-650 mg by mouth daily as needed for moderate pain or headache.  . albuterol (PROAIR HFA) 108 (90 BASE) MCG/ACT inhaler Inhale 2 puffs into the lungs every 4 (four) hours as needed. Shortness of breath  . albuterol (PROVENTIL) (2.5 MG/3ML) 0.083% nebulizer solution 1 vial in nebulizer every 6 hours as needed Dx 496  . ALPRAZolam (XANAX) 0.5 MG tablet Take 0.5 mg by mouth at bedtime. For sleep and anxiety  . aspirin EC 81 MG tablet Take 1 tablet (81 mg total) by mouth daily.  [name] atorvastatin (LIPITOR) 20 MG tablet Take 20 mg by mouth daily.  . bisoprolol (ZEBETA) 5 MG tablet Take 1 tablet (5 mg total) by mouth daily.  . Carboxymethylcellul-Glycerin (LUBRICATING EYE DROPS OP) Apply 1 drop to eye daily as needed (dry eyes).  . Cholecalciferol (VITAMIN D3) 2000 units capsule Take 2,000 Units by mouth daily.   . clopidogrel (PLAVIX) 75 MG tablet Take 1 tablet (75 mg total) by mouth daily with breakfast.  . fluticasone (FLONASE) 50 MCG/ACT nasal spray USE 2 SPRAYS IN EACH NOSTRIL DAILY (Patient taking  differently: USE 2 SPRAYS IN EACH NOSTRIL DAILY AS NEEDED FOR CONGESTION)  . furosemide (LASIX) 40 MG tablet TK 1 T PO D  . gabapentin (NEURONTIN) 800 MG tablet Take 800 mg by mouth 2 (two) times daily.    . insulin aspart (NOVOLOG) 100 UNIT/ML injection Inject 6-8 Units into the skin daily with supper. Sliding scale  . insulin glargine (LANTUS) 100 UNIT/ML injection Inject 0.7 mLs (70 Units total) into the skin at bedtime.  [name] leflunomide (ARAVA) 20 MG tablet Take 20 mg by mouth daily.   [name] levothyroxine (SYNTHROID, LEVOTHROID) 200 MCG tablet Take 200 mcg by mouth daily before breakfast.  . Liraglutide (VICTOZA) 18 MG/3ML SOPN Inject 1.2 mg into the skin at bedtime.   [name] losartan (COZAAR) 50 MG tablet Take 1 tablet (50 mg total) by mouth daily.  . metFORMIN (GLUCOPHAGE) 1000 MG tablet Take 1,000 mg by mouth 2 (two) times daily with a meal.  . mometasone (ASMANEX 60 METERED DOSES) 220 MCG/INH inhaler Inhale 2 puffs into the lungs daily.    . Multiple Vitamins-Minerals (MEGA MULTIVITAMIN FOR MEN PO) Take 1 tablet by mouth daily.   . Olodaterol HCl (STRIVERDI RESPIMAT) 2.5 MCG/ACT AERS Inhale 2.5 Inhalers into the lungs 2 (two) times  daily.   . OXYGEN Inhale 2 L into the lungs continuous.  . sertraline (ZOLOFT) 100 MG tablet Take 50 mg by mouth daily.   [name] spironolactone (ALDACTONE) 25 MG tablet Take 1 tablet (25 mg total) by mouth daily.  . Tamsulosin HCl (FLOMAX) 0.4 MG CAPS Take 0.4 mg by mouth at bedtime.   [name] tiotropium (SPIRIVA HANDIHALER) 18 MCG inhalation capsule Place 18 mcg into inhaler and inhale daily.    . traMADol (ULTRAM) 50 MG tablet TAKE 1 TABLET BY MOUTH EVERY 8 HOURS AS NEEDED     Allergies:   Patient has no known allergies.   Past Medical History:  Diagnosis Date  . Anxiety   . BENIGN PROSTATIC HYPERTROPHY 11/23/2009  . C O P D 07/30/2009  . Cardiomyopathy (HCC) 08/28/2016  . CHRONIC OBSTRUCTIVE PULMONARY DISEASE, ACUTE EXACERBATION 03/01/2010  . CORONARY ARTERY DISEASE 11/23/2009   . 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 DEGENERATIVE JOINT DISEASE 11/23/2009  . DEPRESSION 11/23/2009  . DYSPNEA/SHORTNESS OF BREATH 12/08/2009  . FATIGUE 11/23/2009  . GAIT DISTURBANCE 12/10/2009  . HEMOPTYSIS UNSPECIFIED 05/07/2010  . High cholesterol   . HYPERTENSION 07/30/2009  . HYPOTHYROIDISM 07/30/2009  . 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 On home oxygen therapy    "2-3L; 24/7" (09/10/2016)  . OSA on CPAP   . Pneumonia X 2  . PTSD (post-traumatic stress disorder) 03/10/2012  . 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 RA (rheumatoid arthritis) (HCC) 06/11/2011   "qwhere" (09/10/2016)  . RESPIRATORY FAILURE, CHRONIC 07/31/2009  . Scleritis of both eyes 03/17/2014  . THRUSH 11/06/2009  . 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 Type II diabetes mellitus (HCC)     Past Surgical History:  Procedure Laterality Date  . 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 ABDOMINAL EXPLORATION SURGERY  02/2004   w/LOA/notes 12/10/2010; small bowel obstruction repair with adhesiolysis   . BACK SURGERY    . CARDIAC CATHETERIZATION     2 heart caths in the past.  One in 2000s showed one ulcerated plaque  Rx medically; Second at [facility] [name] 09/05/2016  . CATARACT EXTRACTION W/ INTRAOCULAR LENS  IMPLANT, BILATERAL Bilateral 2000s  . COLECTOMY     hx of remote ileum resection due to bleeding  . CORONARY ANGIOPLASTY WITH STENT PLACEMENT  09/10/2016  . CORONARY STENT INTERVENTION N/A 09/10/2016   Procedure: Coronary Stent Intervention;  Surgeon: Kathleene Hazel, MD;  Location: [facility] INVASIVE CV LAB;  Service: Cardiovascular;  Laterality: N/A;  Distal RCA 4.0x16 Synergy  . FEMORAL EMBOLOECTOMY Left 07/2000   with left leg ischemia; Dr. Hart Rochester, vascular  . GANGLION CYST EXCISION Right    "wrist"; Dr. Teressa Senter  . LUMBAR LAMINECTOMY  1972   Dr. Fannie Knee  . RIGHT/LEFT HEART CATH AND CORONARY ANGIOGRAPHY N/A 09/10/2016   Procedure: Right/Left Heart Cath and Coronary Angiography;  Surgeon: Kathleene Hazel,  MD;  Location: [facility] INVASIVE CV LAB;  Service: Cardiovascular;  Laterality: N/A;  . TONSILLECTOMY       Social History:  The patient  reports that he quit smoking about 18 years ago. His smoking use included Cigarettes, Pipe, and Cigars. He has a 100.00 pack-year smoking history. He has never used smokeless tobacco. He reports that he does not drink alcohol or use drugs.   Family History:  The patient's family history includes Other in his mother.    ROS:  Please see the history of present illness. All other systems are reviewed and  Negative to the above problem  except as noted.    PHYSICAL EXAM: VS:  BP (!) 122/58   Pulse 72   Ht 6\' 3"  (1.905 m)   Wt 231 lb (104.8 kg)   SpO2 91%   BMI 28.87 kg/m   GEN: Well nourished, well developed, in no acute distress  HEENT: normal  Neck: no JVD, carotid bruits, or masses Cardiac: RRR; no murmurs, rubs, or gallops,Tr edema  Respiratory:  clear to auscultation bilaterally, normal work of breathing GI: soft, nontender, nondistended, + BS  No hepatomegaly  MS: no deformity Moving all extremities   Skin: warm and dry, no rash Neuro:  Strength and sensation are intact Psych: euthymic mood, full affect   EKG:  EKG is ordered today.   Lipid Panel    Component Value Date/Time   CHOL 207 (H) 10/02/2016 1045   TRIG 218.0 (H) 10/02/2016 1045   HDL 38.50 (L) 10/02/2016 1045   CHOLHDL 5 10/02/2016 1045   VLDL 43.6 (H) 10/02/2016 1045   LDLCALC 80 04/04/2016 1047   LDLDIRECT 106.0 10/02/2016 1045      Wt Readings from Last 3 Encounters:  10/20/16 231 lb (104.8 kg)  10/02/16 229 lb (103.9 kg)  09/17/16 229 lb (103.9 kg)      ASSESSMENT AND PLAN:  1  CAD  S/p DES to distal RCA  Doing good  Will check CBC    2  Chronic systolic CHF  Volume is pretty good   I told him he may need to take lasix 1 or 2 times per wk   Watch salt   Will check BMET and BNP today    3  HL  WIll check lipdis today    4  HTN    BP is good   F/U in June      Current medicines are reviewed at length with the patient today.  The patient does not have concerns regarding medicines.  Signed, July, MD  10/20/2016 10:08 AM    [facility] [address] [place], [place], [place]  [place] Phone: [phone]; Fax: [phone]

## 2016-10-20 ENCOUNTER — Encounter: Payer: Self-pay | Admitting: Internal Medicine

## 2016-10-20 ENCOUNTER — Ambulatory Visit (INDEPENDENT_AMBULATORY_CARE_PROVIDER_SITE_OTHER): Payer: Medicare Other | Admitting: Internal Medicine

## 2016-10-20 VITALS — BP 122/58 | HR 72 | Ht 75.0 in | Wt 231.0 lb

## 2016-10-20 DIAGNOSIS — I10 Essential (primary) hypertension: Secondary | ICD-10-CM

## 2016-10-20 DIAGNOSIS — I2510 Atherosclerotic heart disease of native coronary artery without angina pectoris: Secondary | ICD-10-CM | POA: Diagnosis not present

## 2016-10-20 DIAGNOSIS — I5022 Chronic systolic (congestive) heart failure: Secondary | ICD-10-CM | POA: Diagnosis not present

## 2016-10-20 NOTE — Patient Instructions (Signed)
Your physician recommends that you continue on your current medications as directed. Please refer to the Current Medication list given to you today.  Your physician recommends that you return for lab work today (BMET, BNP, LIPIDS, CBC)  Your physician recommends that you schedule a follow-up appointment in: June, 2018 with Dr. Tenny Craw.

## 2016-10-20 NOTE — Progress Notes (Signed)
Cardiology Office Note   Date:  10/20/2016   ID:  [name], DOB Mar 18, 1941, MRN [id number]  PCP:  [name], MD  Cardiologist:   [name], MD   F/U of CAD and chronic systolic CHF    History of Present Illness: [name] is a 76 y.o. male with a history of CAD, chronic LBBB, COPD and chronic hypoxemic respiratory failurerequiring oxygen (follow by Dr. Marchelle Gearing) , HTN, RA on chronic steroids, syncope and chronic systolic CHF  I saw him last fall He has episode of syncope in Dec 2017. He became dizzy after carrying boxes from one to the other for wife, does tend to have head position change dizziness as well but this was different, had to sit down, but still passed out. No other associated symptoms.   Carotid doppler 08/25/16 showed 1-39% RICA and 40-59% LICA stenosis. 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 S/p PTCA/DES x1 distal RCA. He dose have moderate non - obstructive disease in mid LAD. He was treated with IV lasix for elevated filling pressure.   He has been seen once by B Bhagat since admit  He says he is doing good  Breathing is OK  Better after taking Lasix last night  He denies CP Still gets dizzy at times  With moving head up and down  To look at things  No syncope         Allergies:   Patient has no known allergies.   Past Medical History:  Diagnosis Date  . Anxiety   . BENIGN PROSTATIC HYPERTROPHY 11/23/2009  . C O P D 07/30/2009  . Cardiomyopathy (HCC) 08/28/2016  . CHRONIC OBSTRUCTIVE PULMONARY DISEASE, ACUTE EXACERBATION 03/01/2010  . CORONARY ARTERY DISEASE 11/23/2009  . 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 DEGENERATIVE JOINT DISEASE 11/23/2009  . DEPRESSION 11/23/2009  . DYSPNEA/SHORTNESS OF BREATH 12/08/2009  . FATIGUE 11/23/2009  . GAIT DISTURBANCE 12/10/2009  . HEMOPTYSIS UNSPECIFIED 05/07/2010  . High  cholesterol   . HYPERTENSION 07/30/2009  . HYPOTHYROIDISM 07/30/2009  . 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 On home oxygen therapy    "2-3L; 24/7" (09/10/2016)  . OSA on CPAP   . Pneumonia X 2  . PTSD (post-traumatic stress disorder) 03/10/2012  . 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 RA (rheumatoid arthritis) (HCC) 06/11/2011   "qwhere" (09/10/2016)  . RESPIRATORY FAILURE, CHRONIC 07/31/2009  . Scleritis of both eyes 03/17/2014  . THRUSH 11/06/2009  . 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 Type II diabetes mellitus (HCC)     Past Surgical History:  Procedure Laterality Date  . 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 ABDOMINAL EXPLORATION SURGERY  02/2004   w/LOA/notes 12/10/2010; small bowel obstruction repair with adhesiolysis   . BACK SURGERY    . CARDIAC CATHETERIZATION     2 heart caths in the past.  One in 2000s showed one ulcerated plaque  Rx medically; Second at [facility] [name] 09/05/2016  . CATARACT EXTRACTION W/ INTRAOCULAR LENS  IMPLANT, BILATERAL Bilateral 2000s  . COLECTOMY     hx of remote ileum resection due to bleeding  . CORONARY ANGIOPLASTY WITH STENT PLACEMENT  09/10/2016  . CORONARY STENT INTERVENTION N/A 09/10/2016   Procedure: Coronary Stent Intervention;  Surgeon: Kathleene Hazel, MD;  Location: [facility] INVASIVE CV LAB;  Service: Cardiovascular;  Laterality: N/A;  Distal RCA 4.0x16 Synergy  . FEMORAL EMBOLOECTOMY Left 07/2000   with left leg ischemia; Dr. Hart Rochester, vascular  . GANGLION CYST EXCISION Right    "wrist"; Dr. Teressa Senter  . LUMBAR LAMINECTOMY  1972   Dr. Fannie Knee  . RIGHT/LEFT HEART CATH AND CORONARY ANGIOGRAPHY N/A 09/10/2016   Procedure: Right/Left Heart Cath and Coronary Angiography;  Surgeon: Kathleene Hazel, MD;  Location: [facility] INVASIVE CV LAB;  Service: Cardiovascular;  Laterality: N/A;  . TONSILLECTOMY       Social History:  The patient  reports that he quit smoking about 18 years ago. His smoking use included Cigarettes, Pipe, and Cigars. He has a  100.00 pack-year smoking history. He has never used smokeless tobacco. He reports that he does not drink alcohol or use drugs.   Family History:  The patient's family history includes Other in his mother.    ROS:  Please see the history of present illness. All other systems are reviewed and  Negative to the above problem except as noted.    PHYSICAL EXAM: VS:  122/58  P 72  Sat on O2 91%  Wt 231 Lb   GEN: Well nourished, well developed, in no acute distress  HEENT: normal  Neck: no JVD, carotid bruits, or masses Cardiac: RRR; no murmurs, rubs, or gallops,no edema  Respiratory:  clear to auscultation bilaterally,   Decreassed airflow but moving   GI: soft, nontender, nondistended, + BS  No hepatomegaly  MS: no deformity Moving all extremities   Skin: warm and dry, no rash Neuro:  Strength and sensation are intact Psych: euthymic mood, full affect   EKG:  EKG is not  ordered today.   Lipid Panel    Component Value Date/Time   CHOL 207 (H) 10/02/2016 1045   TRIG 218.0 (H) 10/02/2016 1045   HDL 38.50 (L) 10/02/2016 1045   CHOLHDL 5 10/02/2016 1045   VLDL 43.6 (H) 10/02/2016 1045   LDLCALC 80 04/04/2016 1047   LDLDIRECT 106.0 10/02/2016 1045      Wt Readings from Last 3 Encounters:  10/20/16 231 lb (104.8 kg)  10/02/16 229 lb (103.9 kg)  09/17/16 229 lb (103.9 kg)      ASSESSMENT AND PLAN: 1  CAD  S/p DES to distal RCA  Dong good  Will check CBC    2  Chronic systolic CHF  Volume is pretty good   I told him he may need to take lasix 1 or 2 times per wk   Watch salt   Will check BMET and BNP today    3  HL  WIll check lipdis today    4  HTN    BP is good   F/U in June      Current medicines are reviewed at length with the patient today.  The patient does not have concerns regarding medicines.  Signed, [name], MD  10/20/2016 10:23 AM    [facility] [address] [place], [place], [place]  [address] Phone: [phone]; Fax: [phone]

## 2016-10-21 ENCOUNTER — Telehealth (INDEPENDENT_AMBULATORY_CARE_PROVIDER_SITE_OTHER): Payer: Self-pay | Admitting: Physical Medicine and Rehabilitation

## 2016-10-21 ENCOUNTER — Telehealth: Payer: Self-pay | Admitting: Pediatrics

## 2016-10-21 DIAGNOSIS — E7800 Pure hypercholesterolemia, unspecified: Secondary | ICD-10-CM

## 2016-10-21 DIAGNOSIS — D649 Anemia, unspecified: Secondary | ICD-10-CM

## 2016-10-21 LAB — BASIC METABOLIC PANEL
BUN/Creatinine Ratio: 14 (ref 10–24)
BUN: 15 mg/dL (ref 8–27)
CALCIUM: 9 mg/dL (ref 8.6–10.2)
CO2: 27 mmol/L (ref 18–29)
CREATININE: 1.09 mg/dL (ref 0.76–1.27)
Chloride: 95 mmol/L — ABNORMAL LOW (ref 96–106)
GFR calc Af Amer: 76 mL/min/{1.73_m2} (ref >59)
GFR, EST NON AFRICAN AMERICAN: 66 mL/min/{1.73_m2} (ref >59)
GLUCOSE: 143 mg/dL — AB (ref 65–99)
POTASSIUM: 5.1 mmol/L (ref 3.5–5.2)
SODIUM: 139 mmol/L (ref 134–144)

## 2016-10-21 LAB — CBC
HEMOGLOBIN: 11.5 g/dL — AB (ref 13.0–17.7)
Hematocrit: 36.1 % — ABNORMAL LOW (ref 37.5–51.0)
MCH: 27.1 pg (ref 26.6–33.0)
MCHC: 31.9 g/dL (ref 31.5–35.7)
MCV: 85 fL (ref 79–97)
Platelets: 169 10*3/uL (ref 150–379)
RBC: 4.24 x10E6/uL (ref 4.14–5.80)
RDW: 15.7 % — ABNORMAL HIGH (ref 12.3–15.4)
WBC: 6.3 10*3/uL (ref 3.4–10.8)

## 2016-10-21 LAB — PRO B NATRIURETIC PEPTIDE: NT-PRO BNP: 188 pg/mL (ref 0–486)

## 2016-10-21 LAB — LIPID PANEL
CHOLESTEROL TOTAL: 159 mg/dL (ref 100–199)
Chol/HDL Ratio: 5.3 ratio units — ABNORMAL HIGH (ref 0.0–5.0)
HDL: 30 mg/dL — ABNORMAL LOW (ref >39)
LDL CALC: 87 mg/dL (ref 0–99)
TRIGLYCERIDES: 209 mg/dL — AB (ref 0–149)
VLDL Cholesterol Cal: 42 mg/dL — ABNORMAL HIGH (ref 5–40)

## 2016-10-21 MED ORDER — ATORVASTATIN CALCIUM 40 MG PO TABS: 40.0000 mg | ORAL_TABLET | Freq: Every day | ORAL | 3 refills | Status: DC

## 2016-10-21 NOTE — Telephone Encounter (Signed)
-----   Message from Pricilla Riffle, MD sent at 10/21/2016 11:00 AM EDT ----- Electrolytes and kidney function are OK I would take lasix probably 1 x per wk as we discussed   Hgb is mildly decreased    Would get anemia panel and repeat CBC in 2 months when returns for lipids Take MVI daily with Fe if not already Lipids   LDL should be lower  WOuld increase lipitor to 40  F/U lipids in 2 months

## 2016-10-21 NOTE — Telephone Encounter (Signed)
I am unable to route staff message to you re holding Plavix. Copied message below- please advise.  Pt just had a stent placed a little over 1 month ago Optimally for an elective procedure it would be 1 year for placement before Plavix is held  [employer]

## 2016-10-21 NOTE — Telephone Encounter (Signed)
I spoke to patient in regards to lab results and recommendations.  He voiced understanding of medication changes, lab results, and f/u lab appt.  Patient voiced understanding and agreed with plan.  Rx sent, labs ordered, and appt made.

## 2016-10-21 NOTE — Telephone Encounter (Signed)
interlam is ok but will decide on which level L1-2 versus L5-S1

## 2016-10-21 NOTE — Telephone Encounter (Signed)
Patient is taking Plavix after a recent cardiac cath/ stent. Message sent to Dr. [name] requesting permission to hold Plavix x7 days prior to the injection.

## 2016-10-22 NOTE — Telephone Encounter (Signed)
If hip and leg pain on left then could do Left L4 or L5 Trans esi with him on plavix, if he is on plan that requires pre-cert then OV first, if not explain that if he has driver with him we can talk over risks etc of doing injection on BT

## 2016-10-22 NOTE — Telephone Encounter (Signed)
Spoke with pt. Hip and leg pain. Scheduled him for Lt L4 or L5 TF for 11/04/16 and advised him to stay on Plavix.

## 2016-10-28 ENCOUNTER — Encounter: Payer: Self-pay | Admitting: Pulmonary Disease

## 2016-10-28 ENCOUNTER — Ambulatory Visit (INDEPENDENT_AMBULATORY_CARE_PROVIDER_SITE_OTHER)
Admission: RE | Admit: 2016-10-28 | Discharge: 2016-10-28 | Disposition: A | Discharge status: Home / Self Care | Payer: Medicare Other | Source: Ambulatory Visit | Attending: Pulmonary Disease | Admitting: Pulmonary Disease

## 2016-10-28 ENCOUNTER — Ambulatory Visit (INDEPENDENT_AMBULATORY_CARE_PROVIDER_SITE_OTHER): Payer: Medicare Other | Admitting: Pulmonary Disease

## 2016-10-28 VITALS — BP 136/74 | HR 75 | Temp 98.5°F | Ht 75.0 in | Wt 232.8 lb

## 2016-10-28 DIAGNOSIS — J189 Pneumonia, unspecified organism: Secondary | ICD-10-CM

## 2016-10-28 DIAGNOSIS — J209 Acute bronchitis, unspecified: Secondary | ICD-10-CM | POA: Diagnosis not present

## 2016-10-28 MED ORDER — PREDNISONE 20 MG PO TABS: 20.0000 mg | ORAL_TABLET | Freq: Every day | ORAL | 0 refills | Status: DC

## 2016-10-28 MED ORDER — LEVOFLOXACIN 500 MG PO TABS: 500.0000 mg | ORAL_TABLET | Freq: Every day | ORAL | 0 refills | Status: DC

## 2016-10-28 NOTE — Patient Instructions (Signed)
It was a pleasure taking care of you today!  You are diagnosed with bronchitis.   Make sure you take your antibiotics:  Levaquin 500 mg/d, 1 tab daily for 7 days.  Start prednisone 20 mg/day, 1 tab a day for 5 days.  Continue with Spiriva and Asmanex.  We discussed potential side effects/adverse reactions of antibiotics, including, but not limited to: rash, diarrhea.  Please call the office if you are having adverse reaction to meds/antibiotics.  Please call the office your symptoms are getting worse despite the meds/antibiotics.    Follow up with [name] as scheduled, sooner if not better.

## 2016-10-28 NOTE — Progress Notes (Signed)
Subjective:    Patient ID: [name], male    DOB: November 23, 1940, 76 y.o.   MRN: [id number]  HPI Patient has COPD and chronic hypoxemic respiratory failure for which he is on 2 L 24 7. He is on Asmanex and Spiriva. He is also being weaned off from prednisone and is currently on embrel  for rheumatoid arthritis. He sees Dr. Marchelle Gearing usually.  He is here for an acute cough which started 3-4 days ago. Coughing up white-yellow sputum. Denies hemoptysis. No fevers or chills. More winded than baseline. Some wheezing. His wife also has COPD but she is not acutely ill. No sick contacts.  He took Enbrel a week ago.     Review of Systems  Constitutional: Negative.   HENT: Positive for congestion.   Eyes: Negative.   Respiratory: Positive for cough, shortness of breath and wheezing.   Cardiovascular: Negative.   Gastrointestinal: Negative.   Endocrine: Negative.   Genitourinary: Negative.   Musculoskeletal: Negative.   Skin: Negative.   Allergic/Immunologic: Negative.   Neurological: Negative.   Hematological: Negative.   Psychiatric/Behavioral: Negative.   All other systems reviewed and are negative.   Past Medical History:  Diagnosis Date  . Anxiety   . BENIGN PROSTATIC HYPERTROPHY 11/23/2009  . C O P D 07/30/2009  . Cardiomyopathy (HCC) 08/28/2016  . CHRONIC OBSTRUCTIVE PULMONARY DISEASE, ACUTE EXACERBATION 03/01/2010  . CORONARY ARTERY DISEASE 11/23/2009  . 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 DEGENERATIVE JOINT DISEASE 11/23/2009  . DEPRESSION 11/23/2009  . DYSPNEA/SHORTNESS OF BREATH 12/08/2009  . FATIGUE 11/23/2009  . GAIT DISTURBANCE 12/10/2009  . HEMOPTYSIS UNSPECIFIED 05/07/2010  . High cholesterol   . HYPERTENSION 07/30/2009  . HYPOTHYROIDISM 07/30/2009  . 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 On home oxygen therapy    "2-3L; 24/7" (09/10/2016)  . OSA on CPAP   . Pneumonia X 2  . PTSD (post-traumatic stress disorder) 03/10/2012  . 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  RA (rheumatoid arthritis) (HCC) 06/11/2011   "qwhere" (09/10/2016)  . RESPIRATORY FAILURE, CHRONIC 07/31/2009  . Scleritis of both eyes 03/17/2014  . THRUSH 11/06/2009  . 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 Type II diabetes mellitus (HCC)      Family History  Problem Relation Age of Onset  . Other Mother     gun shot     Past Surgical History:  Procedure Laterality Date  . 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 ABDOMINAL EXPLORATION SURGERY  02/2004   w/LOA/notes 12/10/2010; small bowel obstruction repair with adhesiolysis   . BACK SURGERY    . CARDIAC CATHETERIZATION     2 heart caths in the past.  One in 2000s showed one ulcerated plaque  Rx medically; Second [facility] [name] 09/05/2016  . CATARACT EXTRACTION W/ INTRAOCULAR LENS  IMPLANT, BILATERAL Bilateral 2000s  . COLECTOMY     hx of remote ileum resection due to bleeding  . CORONARY ANGIOPLASTY WITH STENT PLACEMENT  09/10/2016  . CORONARY STENT INTERVENTION N/A 09/10/2016   Procedure: Coronary Stent Intervention;  Surgeon: Kathleene Hazel, MD;  Location: [facility] INVASIVE CV LAB;  Service: Cardiovascular;  Laterality: N/A;  Distal RCA 4.0x16 Synergy  . FEMORAL EMBOLOECTOMY Left 07/2000   with left leg ischemia; Dr. Hart Rochester, vascular  . GANGLION CYST EXCISION Right    "wrist"; Dr. Teressa Senter  . LUMBAR LAMINECTOMY  1972   Dr. Fannie Knee  . RIGHT/LEFT HEART CATH AND CORONARY ANGIOGRAPHY N/A 09/10/2016   Procedure: Right/Left Heart Cath  and Coronary Angiography;  Surgeon: Kathleene Hazel, MD;  Location: [facility] INVASIVE CV LAB;  Service: Cardiovascular;  Laterality: N/A;  . TONSILLECTOMY      Social History   Social History  . Marital status: Married    Spouse name: N/A  . Number of children: N/A  . Years of education: N/A   Occupational History  . disabled veteran, Ex Human resources officer Retired   Social History Main Topics  . Smoking status: Former Smoker    Packs/day: 2.50    Years: 40.00    Types: Cigarettes, Pipe, Cigars     Quit date: 07/28/1998  . Smokeless tobacco: Never Used  . Alcohol use No  . Drug use: No  . Sexual activity: Not on file   Other Topics Concern  . Not on file   Social History Narrative   Lives in the home with his wife   Sees [place] every 6 month for meds.   Denies asbestos exposure     No Known Allergies   Outpatient Medications Prior to Visit  Medication Sig Dispense Refill  . acetaminophen (TYLENOL) 325 MG tablet Take 325-650 mg by mouth daily as needed for moderate pain or headache.    . albuterol (PROAIR HFA) 108 (90 BASE) MCG/ACT inhaler Inhale 2 puffs into the lungs every 4 (four) hours as needed. Shortness of breath 1 Inhaler 6  . albuterol (PROVENTIL) (2.5 MG/3ML) 0.083% nebulizer solution 1 vial in nebulizer every 6 hours as needed Dx 496 120 mL 6  . ALPRAZolam (XANAX) 0.5 MG tablet Take 0.5 mg by mouth at bedtime. For sleep and anxiety    . aspirin EC 81 MG tablet Take 1 tablet (81 mg total) by mouth daily.    [name] atorvastatin (LIPITOR) 40 MG tablet Take 1 tablet (40 mg total) by mouth daily. 90 tablet 3  . bisoprolol (ZEBETA) 5 MG tablet Take 1 tablet (5 mg total) by mouth daily. 30 tablet 11  . Carboxymethylcellul-Glycerin (LUBRICATING EYE DROPS OP) Apply 1 drop to eye daily as needed (dry eyes).    . Cholecalciferol (VITAMIN D3) 2000 units capsule Take 2,000 Units by mouth daily.     . clopidogrel (PLAVIX) 75 MG tablet Take 1 tablet (75 mg total) by mouth daily with breakfast. 30 tablet 11  . fluticasone (FLONASE) 50 MCG/ACT nasal spray USE 2 SPRAYS IN EACH NOSTRIL DAILY (Patient taking differently: USE 2 SPRAYS IN EACH NOSTRIL DAILY AS NEEDED FOR CONGESTION) 16 g 2  . gabapentin (NEURONTIN) 800 MG tablet Take 800 mg by mouth 2 (two) times daily.      . insulin glargine (LANTUS) 100 UNIT/ML injection Inject 0.7 mLs (70 Units total) into the skin at bedtime. 40 mL 11  . levothyroxine (SYNTHROID, LEVOTHROID) 200 MCG tablet Take 200 mcg by mouth daily before breakfast.    .  Liraglutide (VICTOZA) 18 MG/3ML SOPN Inject 1.2 mg into the skin at bedtime.     [name] losartan (COZAAR) 50 MG tablet Take 1 tablet (50 mg total) by mouth daily. 30 tablet 0  . metFORMIN (GLUCOPHAGE) 1000 MG tablet Take 1,000 mg by mouth 2 (two) times daily with a meal.    . mometasone (ASMANEX 60 METERED DOSES) 220 MCG/INH inhaler Inhale 2 puffs into the lungs daily.      . Multiple Vitamins-Minerals (MEGA MULTIVITAMIN FOR MEN PO) Take 1 tablet by mouth daily.     . Olodaterol HCl (STRIVERDI RESPIMAT) 2.5 MCG/ACT AERS Inhale 2.5 Inhalers into the lungs 2 (two) times  daily.     . OXYGEN Inhale 2 L into the lungs continuous.    . sertraline (ZOLOFT) 100 MG tablet Take 50 mg by mouth daily.     [name] spironolactone (ALDACTONE) 25 MG tablet Take 1 tablet (25 mg total) by mouth daily. 90 tablet 3  . Tamsulosin HCl (FLOMAX) 0.4 MG CAPS Take 0.4 mg by mouth at bedtime.     [name] tiotropium (SPIRIVA HANDIHALER) 18 MCG inhalation capsule Place 18 mcg into inhaler and inhale daily.      . furosemide (LASIX) 40 MG tablet TK 1 T PO D  11  . insulin aspart (NOVOLOG) 100 UNIT/ML injection Inject 6-8 Units into the skin daily with supper. Sliding scale    . leflunomide (ARAVA) 20 MG tablet Take 20 mg by mouth daily.     . traMADol (ULTRAM) 50 MG tablet TAKE 1 TABLET BY MOUTH EVERY 8 HOURS AS NEEDED (Patient not taking: Reported on 10/28/2016) 90 tablet 2   No facility-administered medications prior to visit.    Meds ordered this encounter  Medications  . levofloxacin (LEVAQUIN) 500 MG tablet    Sig: Take 1 tablet (500 mg total) by mouth daily.    Dispense:  7 tablet    Refill:  0  . predniSONE (DELTASONE) 20 MG tablet    Sig: Take 1 tablet (20 mg total) by mouth daily with breakfast.    Dispense:  5 tablet    Refill:  0         Objective:   Physical Exam  Vitals:  Vitals:   10/28/16 1336  BP: 136/74  Pulse: 75  Temp: 98.5 F (36.9 C)  TempSrc: Oral  SpO2: 90%  Weight: 232 lb 12.8 oz (105.6 kg)    Height: 6\' 3"  (1.905 m)    Constitutional/General:  Pleasant, well-nourished, well-developed, not in any distress,  Comfortably seating.  Well kempt  Body mass index is 29.1 kg/m. Wt Readings from Last 3 Encounters:  10/28/16 232 lb 12.8 oz (105.6 kg)  10/20/16 231 lb (104.8 kg)  10/02/16 229 lb (103.9 kg)    HEENT: Pupils equal and reactive to light and accommodation. Anicteric sclerae. Normal nasal mucosa.   No oral  lesions,  mouth clear,  oropharynx clear, no postnasal drip. (-) Oral thrush. No dental caries.  Airway - Mallampati class III  Neck: No masses. Midline trachea. No JVD, (-) LAD. (-) bruits appreciated.  Respiratory/Chest: Grossly normal chest. (-) deformity. (-) Accessory muscle use.  Symmetric expansion. (-) Tenderness on palpation.  Resonant on percussion.  Diminished BS on both lower lung zones. (-)crackles, rhonchi. Some wheezing in Upper zones.  (-) egophony  Cardiovascular: Regular rate and  rhythm, heart sounds normal, no murmur or gallops, no peripheral edema  Gastrointestinal:  Normal bowel sounds. Soft, non-tender. No hepatosplenomegaly.  (-) masses.   Musculoskeletal:  Normal muscle tone. Normal gait.   Extremities: Grossly normal. (-) clubbing, cyanosis.  (-) edema  Skin: (-) rash,lesions seen.   Neurological/Psychiatric : alert, oriented to time, place, person. Normal mood and affect          Assessment & Plan:  Acute bronchitis Patient known to have severe COPD. He is on oxygen, 2 L 24 7. Recent acute exacerbation of COPD related to bronchitis. Started 3-4 days ago. Cough with yellow sputum, dyspnea, wheezing. No fevers, chills. Recently started Enbrel a week ago.  Plan : 1. CXR >> no obvious infiltrates seen.  2. Levaquin 500 mg/tab, daily x 7 days 3.  Prednisone 20 mg/d x 5 days 4. Cont Asmanex, Spiriva, albuterol. 5.  Follow up in May or June as scheduled to see TP.  If not better in 1-2 weeks, needs to be seen sooner.  Will  consider doing chest ct scan then. May be dealing with OI given that he is on embrel.     I personally reviewed previous images (Chest Xray, Chest Ct scan) done on this patient. I reviewed the reports on the images as well.    Return to clinic with TP as scheduled, sooner if not better.   Pollie Meyer, MD 10/28/2016, 8:43 PM [facility] Pager [phone] After 3 pm or if no answer, call [phone]

## 2016-10-28 NOTE — Assessment & Plan Note (Addendum)
Patient known to have severe COPD. He is on oxygen, 2 L 24 7. Recent acute exacerbation of COPD related to bronchitis. Started 3-4 days ago. Cough with yellow sputum, dyspnea, wheezing. No fevers, chills. Recently started Enbrel a week ago.  Plan : 1. CXR >> no obvious infiltrates seen.  2. Levaquin 500 mg/tab, daily x 7 days 3. Prednisone 20 mg/d x 5 days 4. Cont Asmanex, Spiriva, albuterol. 5.  Follow up in May or June as scheduled to see TP.  If not better in 1-2 weeks, needs to be seen sooner.  Will consider doing chest ct scan then. May be dealing with OI given that he is on embrel.

## 2016-10-31 ENCOUNTER — Telehealth (HOSPITAL_COMMUNITY): Payer: Self-pay | Admitting: Internal Medicine

## 2016-10-31 NOTE — Telephone Encounter (Signed)
I called and left message on voicemail to call office about scheduling for Cardiac Rehab program. I left my contact information on patient voicemail. °

## 2016-11-04 ENCOUNTER — Encounter (INDEPENDENT_AMBULATORY_CARE_PROVIDER_SITE_OTHER): Payer: Self-pay | Admitting: Physical Medicine and Rehabilitation

## 2016-11-04 ENCOUNTER — Ambulatory Visit (INDEPENDENT_AMBULATORY_CARE_PROVIDER_SITE_OTHER): Payer: Medicare Other | Admitting: Physical Medicine and Rehabilitation

## 2016-11-04 ENCOUNTER — Ambulatory Visit (INDEPENDENT_AMBULATORY_CARE_PROVIDER_SITE_OTHER): Payer: Self-pay

## 2016-11-04 VITALS — BP 143/70 | HR 61

## 2016-11-04 DIAGNOSIS — M5416 Radiculopathy, lumbar region: Secondary | ICD-10-CM | POA: Diagnosis not present

## 2016-11-04 MED ORDER — METHYLPREDNISOLONE ACETATE 80 MG/ML IJ SUSP
80.0000 mg | Freq: Once | INTRAMUSCULAR | Status: AC
  Administered 2016-11-04: 80 mg

## 2016-11-04 MED ORDER — LIDOCAINE HCL (PF) 1 % IJ SOLN
0.3300 mL | Freq: Once | INTRAMUSCULAR | Status: AC
  Administered 2016-11-04: 0.3 mL

## 2016-11-04 NOTE — Patient Instructions (Signed)

## 2016-11-04 NOTE — Progress Notes (Signed)
[name] - 76 y.o. male MRN [id number]  Date of birth: 06/06/1941  Office Visit Note: Visit Date: 11/04/2016 PCP: [name], MD Referred by: Corwin Levins, MD  Subjective: Chief Complaint  Patient presents with  . Lower Back - Pain   HPI: [name] is a pleasant 76 year old gentleman who is very medically complicated with COPD and heart disease. He had a recent stent placed and is on Plavix. He reports chronic worsening bilateral lower back pain into buttocks. Worse on right side. Can't sit for long periods. Worse with bending. Prior MRI from 2017 shows multilevel spondylitic changes and degenerative change. He's had prior laminectomies in the lumbar spine. At L5-S1 there is some lateral recess narrowing due to arthropathy and degenerative disc height loss. He does have significant disc extrusion at T12-L1 but no compression. We'll long discussion today about doing a transforaminal epidural steroid injection while still on Plavix. He does want to proceed with an injection. Within a complete bilateral S1 transforaminal injections.    ROS Otherwise per HPI.  Assessment & Plan: Visit Diagnoses:  1. Lumbar radiculopathy     Plan: Findings:  Bilateral S1 transforaminal epidural steroid injections. We had a long discussion and he did consent to completing transforaminal injection still on blood thinner. A lot of the new standards coming out are suggesting the transforaminal approach epidural injections while still on blood thinner probably outweigh the risk in terms of taking him off the blood thinner. Because he had a recent stent placement she was not allowed to discontinue his blood thinner for the procedure.    Meds & Orders:  Meds ordered this encounter  Medications  . methylPREDNISolone acetate (DEPO-MEDROL) injection 80 mg  . lidocaine (PF) (XYLOCAINE) 1 % injection 0.3 mL    Orders Placed This Encounter  Procedures  . XR C-ARM NO REPORT  . Epidural Steroid  injection    Follow-up: Return if symptoms worsen or fail to improve.   Procedures: No procedures performed  S1 Lumbosacral Transforaminal Epidural Steroid Injection - Sub-Pedicular Approach with Fluoroscopic Guidance   Patient: [name]      Date of Birth: Sep 10, 1940 MRN: [id number] PCP: [name], MD      Visit Date: 11/04/2016   Universal Protocol:    Date/Time: 04/12/185:36 AM  Consent Given By: the patient  Position:  PRONE  Additional Comments: Vital signs were monitored before and after the procedure. Patient was prepped and draped in the usual sterile fashion. The correct patient, procedure, and site was verified.   Injection Procedure Details:  Procedure Site One Meds Administered:  Meds ordered this encounter  Medications  . methylPREDNISolone acetate (DEPO-MEDROL) injection 80 mg  . lidocaine (PF) (XYLOCAINE) 1 % injection 0.3 mL    Laterality: Bilateral  Location/Site:  S1 Foramen   Needle size: 22 G  Needle type: Spinal  Needle Placement: Transforaminal  Findings:  -Contrast Used: 1 mL iohexol 180 mg iodine/mL   -Comments: Excellent flow of contrast along the nerve and into the epidural space.  Procedure Details: After squaring off the sacral end-plate to get a true AP view, the C-arm was positioned so that the best possible view of the S1 foramen was visualized. The soft tissues overlying this structure were infiltrated with 2-3 ml. of 1% Lidocaine without Epinephrine.    The spinal needle was inserted toward the target using a "trajectory" view along the fluoroscope beam.  Under AP and lateral visualization, the needle was advanced so it did  not puncture dura. Biplanar projections were used to confirm position. Aspiration was confirmed to be negative for CSF and/or blood. A 1-2 ml. volume of Isovue-250 was injected and flow of contrast was noted at each level. Radiographs were obtained for documentation purposes.   After attaining the  desired flow of contrast documented above, a 0.5 to 1.0 ml test dose of 0.25% Marcaine was injected into each respective transforaminal space.  The patient was observed for 90 seconds post injection.  After no sensory deficits were reported, and normal lower extremity motor function was noted,   the above injectate was administered so that equal amounts of the injectate were placed at each foramen (level) into the transforaminal epidural space.   Additional Comments:  The patient tolerated the procedure well Dressing: Band-Aid    Post-procedure details: Patient was observed during the procedure. Post-procedure instructions were reviewed.  Patient left the clinic in stable condition.     Clinical History: MRI LUMBAR SPINE WITHOUT CONTRAST 12/29/2015  TECHNIQUE: Multiplanar, multisequence MR imaging of the lumbar spine was performed. No intravenous contrast was administered.  COMPARISON:  None.  FINDINGS: Segmentation: 5 non rib-bearing lumbar type vertebral bodies are present.  Alignment: Slight retrolisthesis is present at L2-3. There is straightening of the normal lumbar lordosis. AP alignment is otherwise anatomic.  Vertebrae: Minimal chronic endplate marrow changes are present at L4-5.  Conus medullaris: Extends to the T12 level and appears normal.  Paraspinal and other soft tissues: A 2.3 cm cyst is present at the lower pole of the left kidney. No other focal lesions are present. There is no significant adenopathy.  Disc levels:  T11-12: A shallow central disc protrusion is present. There is no significant stenosis.  T12-L1: A superior disc extrusion extends 15 mm above the inferior endplate of T12. This partially effaces the ventral CSF. The foramina are patent bilaterally.  L1-2: A leftward broad-based disc protrusion is present. Mild facet hypertrophy is noted. This results in mild left central and foraminal narrowing.  L2-3: A broad-based disc  protrusion is present. Moderate facet hypertrophy is evident. Moderate subarticular narrowing is noted bilaterally. Mild foraminal narrowing is present bilaterally as well.  L3-4: A broad-based disc protrusion is present. Mild facet hypertrophy is noted. Moderate left and mild right subarticular narrowing is present. Moderate left and mild right foraminal narrowing is present.  L4-5: A left laminectomy is noted. Residual moderate left and mild right subarticular narrowing is present. Moderate left and mild right foraminal stenosis is stable.  L5-S1: A prominent central disc protrusion is again noted. There is moderate subarticular and bilateral foraminal narrowing without significant interval change.  IMPRESSION: 1. 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 2. Superior disc extrusion at T12-L1 extends 15 mm above the inferior endplate of T12. 3. Mild left central and foraminal stenosis at L1-2. 4. Moderate subarticular and mild foraminal narrowing bilaterally at L2-3. 5. Moderate left and mild right subarticular and foraminal narrowing bilaterally at L3-4 and L4-5 is stable. 6. Moderate subarticular and bilateral foraminal stenosis at L5-S1 without significant interval change.  He reports that he quit smoking about 18 years ago. His smoking use included Cigarettes, Pipe, and Cigars. He has a 100.00 pack-year smoking history. He has never used smokeless tobacco.   Recent Labs  04/04/16 1047 07/11/16 1604 10/02/16 1045  HGBA1C 8.1* 9.0* 9.5*    Objective:  VS:  HT:    WT:   BMI:     BP:(!) 143/70  HR:61bpm  TEMP: ( )  RESP:98 % Physical Exam  Musculoskeletal:  Patient ambulates without aid. He has good distal strength. He has pain with extension rotation of the lumbar spine has some paraspinal tenderness and pain over the trochanters but not significant. No pain with hip rotation.    Ortho Exam Imaging: No results found.  Past  Medical/Family/Surgical/Social History: Medications & Allergies reviewed per EMR Patient Active Problem List   Diagnosis Date Noted  . Acute bronchitis 10/28/2016  . Acute on chronic combined systolic and diastolic CHF (congestive heart failure) (HCC) 09/11/2016  . Unstable angina (HCC)   . Cardiomyopathy (HCC) 08/28/2016  . Urinary urgency 07/10/2016  . Shoulder arthritis 06/12/2016  . Polyarthralgia 04/04/2016  . 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 Corn of foot 10/02/2015  . Post-nasal drip 12/11/2014  . COPD, severe (HCC) 08/04/2014  . Dysuria 05/04/2014  . Scleritis of both eyes 03/17/2014  . Hyperlipidemia 01/05/2014  . Ileus (HCC) 08/22/2013  . Community acquired pneumonia 07/19/2013  . COPD exacerbation (HCC) 05/31/2012  . Obesity 04/20/2012  . PTSD (post-traumatic stress disorder) 03/10/2012  . Orthostatic hypotension 02/02/2012  . Syncope 01/26/2012  . RA (rheumatoid arthritis) (HCC) 06/11/2011  . OSA on CPAP 06/10/2011  . LBBB (left bundle branch block) 03/25/2011  . Diastolic dysfunction 02/25/2011  . Acute and chronic respiratory failure 02/21/2011  . Insomnia 12/05/2010  . Erectile dysfunction 12/05/2010  . Preventative health care 12/04/2010  . 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 GAIT DISTURBANCE 12/10/2009  . Diabetes (HCC) 11/23/2009  . Depression 11/23/2009  . Coronary atherosclerosis 11/23/2009  . BENIGN PROSTATIC HYPERTROPHY 11/23/2009  . DEGENERATIVE JOINT DISEASE 11/23/2009  . FATIGUE 11/23/2009  . 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 RESPIRATORY FAILURE, CHRONIC 07/31/2009  . HYPOTHYROIDISM 07/30/2009  . Essential hypertension 07/30/2009  . COPD (chronic obstructive pulmonary disease) (HCC) 07/30/2009   Past Medical History:  Diagnosis Date  . Anxiety   . BENIGN PROSTATIC HYPERTROPHY 11/23/2009  . C O P D 07/30/2009  . Cardiomyopathy (HCC) 08/28/2016  . CHRONIC OBSTRUCTIVE PULMONARY DISEASE, ACUTE  EXACERBATION 03/01/2010  . CORONARY ARTERY DISEASE 11/23/2009  . 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 DEGENERATIVE JOINT DISEASE 11/23/2009  . DEPRESSION 11/23/2009  . DYSPNEA/SHORTNESS OF BREATH 12/08/2009  . FATIGUE 11/23/2009  . GAIT DISTURBANCE 12/10/2009  . HEMOPTYSIS UNSPECIFIED 05/07/2010  . High cholesterol   . HYPERTENSION 07/30/2009  . HYPOTHYROIDISM 07/30/2009  . 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 On home oxygen therapy    "2-3L; 24/7" (09/10/2016)  . OSA on CPAP   . Pneumonia X 2  . PTSD (post-traumatic stress disorder) 03/10/2012  . 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 RA (rheumatoid arthritis) (HCC) 06/11/2011   "qwhere" (09/10/2016)  . RESPIRATORY FAILURE, CHRONIC 07/31/2009  . Scleritis of both eyes 03/17/2014  . THRUSH 11/06/2009  . 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 Type II diabetes mellitus (HCC)    Family History  Problem Relation Age of Onset  . Other Mother     gun shot   Past Surgical History:  Procedure Laterality Date  . 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 ABDOMINAL EXPLORATION SURGERY  02/2004   w/LOA/notes 12/10/2010; small bowel obstruction repair with adhesiolysis   . BACK SURGERY    . CARDIAC CATHETERIZATION     2 heart caths in the past.  One in 2000s showed one ulcerated plaque  Rx medically; Second at [facility] [name] 09/05/2016  . CATARACT EXTRACTION W/ INTRAOCULAR LENS  IMPLANT,  BILATERAL Bilateral 2000s  . COLECTOMY     hx of remote ileum resection due to bleeding  . CORONARY ANGIOPLASTY WITH STENT PLACEMENT  09/10/2016  . CORONARY STENT INTERVENTION N/A 09/10/2016   Procedure: Coronary Stent Intervention;  Surgeon: Kathleene Hazel, MD;  Location: [facility] INVASIVE CV LAB;  Service: Cardiovascular;  Laterality: N/A;  Distal RCA 4.0x16 [employer]  . FEMORAL EMBOLOECTOMY Left 07/2000   with left leg ischemia; Dr. Hart Rochester, vascular  . GANGLION CYST EXCISION Right    "wrist"; Dr. Teressa Senter  . LUMBAR LAMINECTOMY  1972   Dr. Fannie Knee  . RIGHT/LEFT HEART  CATH AND CORONARY ANGIOGRAPHY N/A 09/10/2016   Procedure: Right/Left Heart Cath and Coronary Angiography;  Surgeon: Kathleene Hazel, MD;  Location: [facility] INVASIVE CV LAB;  Service: Cardiovascular;  Laterality: N/A;  . TONSILLECTOMY     Social History   Occupational History  . disabled veteran, Ex Human resources officer Retired   Social History Main Topics  . Smoking status: Former Smoker    Packs/day: 2.50    Years: 40.00    Types: Cigarettes, Pipe, Cigars    Quit date: 07/28/1998  . Smokeless tobacco: Never Used  . Alcohol use No  . Drug use: No  . Sexual activity: Not on file

## 2016-11-06 NOTE — Procedures (Signed)
S1 Lumbosacral Transforaminal Epidural Steroid Injection - Sub-Pedicular Approach with Fluoroscopic Guidance   Patient: [name]      Date of Birth: 1940/10/10 MRN: [id number] PCP: [name], MD      Visit Date: 11/04/2016   Universal Protocol:    Date/Time: 04/12/185:36 AM  Consent Given By: the patient  Position:  PRONE  Additional Comments: Vital signs were monitored before and after the procedure. Patient was prepped and draped in the usual sterile fashion. The correct patient, procedure, and site was verified.   Injection Procedure Details:  Procedure Site One Meds Administered:  Meds ordered this encounter  Medications  . methylPREDNISolone acetate (DEPO-MEDROL) injection 80 mg  . lidocaine (PF) (XYLOCAINE) 1 % injection 0.3 mL    Laterality: Bilateral  Location/Site:  S1 Foramen   Needle size: 22 G  Needle type: Spinal  Needle Placement: Transforaminal  Findings:  -Contrast Used: 1 mL iohexol 180 mg iodine/mL   -Comments: Excellent flow of contrast along the nerve and into the epidural space.  Procedure Details: After squaring off the sacral end-plate to get a true AP view, the C-arm was positioned so that the best possible view of the S1 foramen was visualized. The soft tissues overlying this structure were infiltrated with 2-3 ml. of 1% Lidocaine without Epinephrine.    The spinal needle was inserted toward the target using a "trajectory" view along the fluoroscope beam.  Under AP and lateral visualization, the needle was advanced so it did not puncture dura. Biplanar projections were used to confirm position. Aspiration was confirmed to be negative for CSF and/or blood. A 1-2 ml. volume of Isovue-250 was injected and flow of contrast was noted at each level. Radiographs were obtained for documentation purposes.   After attaining the desired flow of contrast documented above, a 0.5 to 1.0 ml test dose of 0.25% Marcaine was injected into each  respective transforaminal space.  The patient was observed for 90 seconds post injection.  After no sensory deficits were reported, and normal lower extremity motor function was noted,   the above injectate was administered so that equal amounts of the injectate were placed at each foramen (level) into the transforaminal epidural space.   Additional Comments:  The patient tolerated the procedure well Dressing: Band-Aid    Post-procedure details: Patient was observed during the procedure. Post-procedure instructions were reviewed.  Patient left the clinic in stable condition.

## 2016-11-10 ENCOUNTER — Encounter (HOSPITAL_COMMUNITY): Payer: Self-pay | Admitting: Internal Medicine

## 2016-11-10 NOTE — Progress Notes (Signed)
I mailed patient letter with information about the Cardiac Rehab program.

## 2016-11-11 ENCOUNTER — Telehealth (HOSPITAL_COMMUNITY): Payer: Self-pay | Admitting: Internal Medicine

## 2016-11-11 NOTE — Telephone Encounter (Signed)
Patient returned call, patient stated that he is not ready to participate in the cardiac rehab program at this time. Patient asked that I give him a month before I reach out to him to again to see if he is ready to participate.

## 2016-11-11 NOTE — Telephone Encounter (Signed)
I called and patient wife answered the phone. Patient was not available to speak. Patient wife stated she would give him my contact information that I left to return call.

## 2016-11-14 ENCOUNTER — Telehealth: Payer: Self-pay | Admitting: Internal Medicine

## 2016-11-14 NOTE — Telephone Encounter (Signed)
Called patient.  He states he can sleep 8 hours every night and still be so fatigued all day long and he wonders if it is a medication he's taking.  This has been since he had cath/stent in February.  Breathing is ok--took predisone and levaquin earlier this month.  Weight has decreased, his appetite has decreased.  He has lost about 8-9 pounds.  Not taking lasix, no swelling.  Blood sugars have been "stable"  BP today is 168/71, HR 63.  He has not called his PCP yet.   I advised I will forward to Dr. Tenny Craw and will call him back with any recommendations on Monday.  He has labs scheduled for 12/23/16.

## 2016-11-14 NOTE — Telephone Encounter (Signed)
Pt is concerned about his weight loss and no energy.  Pt would like a call back on what to do.

## 2016-11-17 NOTE — Telephone Encounter (Signed)
I saw him in March Consider repeating BMET, CBC and BNP    Thyroid was normal

## 2016-11-17 NOTE — Telephone Encounter (Signed)
Spoke with patient and informed Dr. Tenny Craw would recheck some labs and depending on those results would refer back to primary care.  Pt states that he will just call his PCP tomorrow to get an appointment.

## 2016-11-19 ENCOUNTER — Other Ambulatory Visit: Payer: Self-pay | Admitting: *Deleted

## 2016-11-19 DIAGNOSIS — I2510 Atherosclerotic heart disease of native coronary artery without angina pectoris: Secondary | ICD-10-CM

## 2016-12-11 ENCOUNTER — Encounter: Payer: Self-pay | Admitting: Adult Health

## 2016-12-11 ENCOUNTER — Ambulatory Visit (INDEPENDENT_AMBULATORY_CARE_PROVIDER_SITE_OTHER): Payer: Medicare Other | Admitting: Adult Health

## 2016-12-11 DIAGNOSIS — G4733 Obstructive sleep apnea (adult) (pediatric): Secondary | ICD-10-CM

## 2016-12-11 DIAGNOSIS — J449 Chronic obstructive pulmonary disease, unspecified: Secondary | ICD-10-CM

## 2016-12-11 DIAGNOSIS — J9611 Chronic respiratory failure with hypoxia: Secondary | ICD-10-CM

## 2016-12-11 DIAGNOSIS — Z9989 Dependence on other enabling machines and devices: Secondary | ICD-10-CM | POA: Diagnosis not present

## 2016-12-11 MED ORDER — PREDNISONE 10 MG PO TABS: ORAL_TABLET | ORAL | 0 refills | Status: DC

## 2016-12-11 MED ORDER — DOXYCYCLINE HYCLATE 100 MG PO TABS: 100.0000 mg | ORAL_TABLET | Freq: Two times a day (BID) | ORAL | 0 refills | Status: DC

## 2016-12-11 MED ORDER — BUDESONIDE-FORMOTEROL FUMARATE 160-4.5 MCG/ACT IN AERO: 2.0000 | INHALATION_SPRAY | Freq: Two times a day (BID) | RESPIRATORY_TRACT | 11 refills | Status: DC

## 2016-12-11 MED ORDER — BUDESONIDE-FORMOTEROL FUMARATE 160-4.5 MCG/ACT IN AERO: 2.0000 | INHALATION_SPRAY | Freq: Two times a day (BID) | RESPIRATORY_TRACT | 0 refills | Status: DC

## 2016-12-11 NOTE — Patient Instructions (Addendum)
Doxycycline 100mg  Twice daily  For 7 days , wear sunscreen . Take w/ food.  Prednisone taper to have on hold if symptoms worsen with wheezing .  Stop Asmanex.  Begin Symbicort 160 2 puffs Twice daily  , rinse after use.  Continue on Spiriva daily.  Continue on Oxygen 2l/m .  Continue on CPAP At bedtime  . -Get download for next visit .  Follow up with in 2 months and As needed   Please contact office for sooner follow up if symptoms do not improve or worsen or seek emergency care

## 2016-12-11 NOTE — Assessment & Plan Note (Signed)
Cont on CPAP At bedtime   Check download on return  

## 2016-12-11 NOTE — Assessment & Plan Note (Addendum)
Recurrent flare w/ URI /AR  Begin Abx  Steroids to have on hold if sx/wheezing worsen/not resolve  Expand copd tx w/ symbicort in place of asmanex.   Plan  Patient Instructions  Doxycycline 100mg  Twice daily  For 7 days , wear sunscreen . Take w/ food.  Prednisone taper to have on hold if symptoms worsen with wheezing .  Stop Asmanex.  Begin Symbicort 160 2 puffs Twice daily  , rinse after use.  Continue on Spiriva daily.  Continue on Oxygen 2l/m .  Continue on CPAP At bedtime  . -Get download for next visit .  Follow up with in 2 months and As needed   Please contact office for sooner follow up if symptoms do not improve or worsen or seek emergency care

## 2016-12-11 NOTE — Assessment & Plan Note (Signed)
Cont on o2 .  

## 2016-12-11 NOTE — Progress Notes (Signed)
Reviewed & agree with plan  

## 2016-12-11 NOTE — Progress Notes (Signed)
@Patient  ID: , male    DOB: 1941-04-03, 76 y.o.   MRN: [id number]  Chief Complaint  Patient presents with  . 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  PFT 10/2015 FEV1 was 36%, ratio 46, FVC 58%, DLCO 26%  12/11/2016 Follow up: COPD /O2 RF  Pt returns for follow up for COPD . He was seen last month for COPD flare ,tx w/ abx and steroids . Says he got better.  Was doing good until yesterday . He was on trip 12/13/2016. Was exposed to family members that were sick . Was out in rain that seemed to cause his cough to be worse.  Increased cough , sore throat, nasal drainage. Coughing up thick yellow mucus.  No fever, hemoptysis , edema or chest pain.   Remains on Asmanex and Spiriva .  On O2 at 2l/m .   Has RA on Enbrel .   Remains on CPAP At bedtime  . Doing well . Wears each night.   Pt had new dx Cardiomyopathy in Feb , seen by cards and underwent R/L Heart Cath with severe stenosis of RCA s/p PTCA/DES to distal RCA. Denies chest pain or syncope.    No Known Allergies  Immunization History  Administered Date(s) Administered  . Influenza Split 04/14/2011, 04/27/2013  . Influenza Whole 05/28/2009, 04/27/2010, 03/31/2012  . Influenza, High Dose Seasonal PF 03/21/2015  . Influenza,inj,Quad PF,36+ Mos 03/17/2014, 04/04/2016  . Pneumococcal Conjugate-13 09/20/2013  . Pneumococcal Polysaccharide-23 05/28/2008  . Pneumococcal-Unspecified 04/27/2013  . Td 11/23/2009  . Zoster 04/28/2009    Past Medical History:  Diagnosis Date  . Anxiety   . BENIGN PROSTATIC HYPERTROPHY 11/23/2009  . C O P D 07/30/2009  . Cardiomyopathy (HCC) 08/28/2016  . CHRONIC OBSTRUCTIVE PULMONARY DISEASE, ACUTE  EXACERBATION 03/01/2010  . CORONARY ARTERY DISEASE 11/23/2009  . 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 DEGENERATIVE JOINT DISEASE 11/23/2009  . DEPRESSION 11/23/2009  . DYSPNEA/SHORTNESS OF BREATH 12/08/2009  . FATIGUE 11/23/2009  . GAIT DISTURBANCE 12/10/2009  . HEMOPTYSIS UNSPECIFIED 05/07/2010  . High cholesterol   . HYPERTENSION 07/30/2009  . HYPOTHYROIDISM 07/30/2009  . 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 On home oxygen therapy    "2-3L; 24/7" (09/10/2016)  . OSA on CPAP   . Pneumonia X 2  . PTSD (post-traumatic stress disorder) 03/10/2012  . 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 RA (rheumatoid arthritis) (HCC) 06/11/2011   "qwhere" (09/10/2016)  . RESPIRATORY FAILURE, CHRONIC 07/31/2009  . Scleritis of both eyes 03/17/2014  . THRUSH 11/06/2009  . 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 Type II diabetes mellitus (HCC)     Tobacco History: History  Smoking Status  . Former Smoker  . Packs/day: 2.50  . Years: 40.00  . Types: Cigarettes, Pipe, Cigars  . Quit date: 07/28/1998  Smokeless Tobacco  . Never Used   Counseling given: Not Answered   Outpatient Encounter Prescriptions as of 12/11/2016  Medication Sig  . acetaminophen (TYLENOL) 325 MG tablet Take 325-650 mg by mouth daily as needed for moderate pain or headache.  . albuterol (PROAIR HFA) 108 (90 BASE) MCG/ACT inhaler Inhale 2 puffs into the lungs every 4 (four) hours as needed. Shortness of breath  .  albuterol (PROVENTIL) (2.5 MG/3ML) 0.083% nebulizer solution 1 vial in nebulizer every 6 hours as needed Dx 496  . ALPRAZolam (XANAX) 0.5 MG tablet Take 0.5 mg by mouth at bedtime. For sleep and anxiety  . aspirin EC 81 MG tablet Take 1 tablet (81 mg total) by mouth daily.  [name] atorvastatin (LIPITOR) 40 MG tablet Take 1 tablet (40 mg total) by mouth daily.  . bisoprolol (ZEBETA) 5 MG tablet Take 1 tablet (5 mg total) by mouth daily.  . Carboxymethylcellul-Glycerin (LUBRICATING EYE DROPS OP) Apply 1 drop to eye daily as needed (dry eyes).  .  Cholecalciferol (VITAMIN D3) 2000 units capsule Take 2,000 Units by mouth daily.   . clopidogrel (PLAVIX) 75 MG tablet Take 1 tablet (75 mg total) by mouth daily with breakfast.  . fluticasone (FLONASE) 50 MCG/ACT nasal spray USE 2 SPRAYS IN EACH NOSTRIL DAILY (Patient taking differently: USE 2 SPRAYS IN EACH NOSTRIL DAILY AS NEEDED FOR CONGESTION)  . furosemide (LASIX) 40 MG tablet TK 1 T PO D  . gabapentin (NEURONTIN) 800 MG tablet Take 800 mg by mouth 2 (two) times daily.    . insulin aspart (NOVOLOG) 100 UNIT/ML injection Inject 6-8 Units into the skin daily with supper. Sliding scale  . insulin glargine (LANTUS) 100 UNIT/ML injection Inject 0.7 mLs (70 Units total) into the skin at bedtime.  [name] leflunomide (ARAVA) 20 MG tablet Take 20 mg by mouth daily.   [name] levothyroxine (SYNTHROID, LEVOTHROID) 200 MCG tablet Take 200 mcg by mouth daily before breakfast.  . Liraglutide (VICTOZA) 18 MG/3ML SOPN Inject 1.2 mg into the skin at bedtime.   [name] losartan (COZAAR) 50 MG tablet Take 1 tablet (50 mg total) by mouth daily.  . metFORMIN (GLUCOPHAGE) 1000 MG tablet Take 1,000 mg by mouth 2 (two) times daily with a meal.  . Multiple Vitamins-Minerals (MEGA MULTIVITAMIN FOR MEN PO) Take 1 tablet by mouth daily.   . OXYGEN Inhale 2 L into the lungs continuous.  . predniSONE (DELTASONE) 20 MG tablet Take 1 tablet (20 mg total) by mouth daily with breakfast.  . sertraline (ZOLOFT) 100 MG tablet Take 50 mg by mouth daily.   [name] spironolactone (ALDACTONE) 25 MG tablet Take 1 tablet (25 mg total) by mouth daily.  . Tamsulosin HCl (FLOMAX) 0.4 MG CAPS Take 0.4 mg by mouth at bedtime.   [name] tiotropium (SPIRIVA HANDIHALER) 18 MCG inhalation capsule Place 18 mcg into inhaler and inhale daily.    . [DISCONTINUED] mometasone (ASMANEX 60 METERED DOSES) 220 MCG/INH inhaler Inhale 2 puffs into the lungs daily.    . [DISCONTINUED] Olodaterol HCl (STRIVERDI RESPIMAT) 2.5 MCG/ACT AERS Inhale 2.5 Inhalers into the lungs 2 (two)  times daily.   . budesonide-formoterol (SYMBICORT) 160-4.5 MCG/ACT inhaler Inhale 2 puffs into the lungs 2 (two) times daily.  . budesonide-formoterol (SYMBICORT) 160-4.5 MCG/ACT inhaler Inhale 2 puffs into the lungs 2 (two) times daily.  [name] doxycycline (VIBRA-TABS) 100 MG tablet Take 1 tablet (100 mg total) by mouth 2 (two) times daily.  . predniSONE (DELTASONE) 10 MG tablet 4 tabs for 2 days, then 3 tabs for 2 days, 2 tabs for 2 days, then 1 tab for 2 days, then stop  . [DISCONTINUED] levofloxacin (LEVAQUIN) 500 MG tablet Take 1 tablet (500 mg total) by mouth daily.  . [DISCONTINUED] traMADol (ULTRAM) 50 MG tablet TAKE 1 TABLET BY MOUTH EVERY 8 HOURS AS NEEDED (Patient not taking: Reported on 10/28/2016)   No facility-administered encounter medications on file as of 12/11/2016.  Review of Systems  Constitutional:   No  weight loss, night sweats,  Fevers, chills, + fatigue, or  lassitude.  HEENT:   No headaches,  Difficulty swallowing,  Tooth/dental problems, or  Sore throat,                No sneezing, itching, ear ache,  +nasal congestion, post nasal drip,   CV:  No chest pain,  Orthopnea, PND, swelling in lower extremities, anasarca, dizziness, palpitations, syncope.   GI  No heartburn, indigestion, abdominal pain, nausea, vomiting, diarrhea, change in bowel habits, loss of appetite, bloody stools.   Resp   No chest wall deformity  Skin: no rash or lesions.  GU: no dysuria, change in color of urine, no urgency or frequency.  No flank pain, no hematuria   MS: RA /Chronic joint pain   Physical Exam  BP 131/82 (BP Location: Left Arm, Cuff Size: Normal)   Pulse 60   Ht 6\' 3"  (1.905 m)   Wt 223 lb (101.2 kg)   SpO2 94%   BMI 27.87 kg/m   GEN: A/Ox3; pleasant , NAD, elderly on o2    HEENT:  [place]/AT,  EACs-clear, TMs-wnl, NOSE-clear drainage , THROAT-clear, no lesions, no postnasal drip or exudate noted.   NECK:  Supple w/ fair ROM; no JVD; normal carotid impulses w/o  bruits; no thyromegaly or nodules palpated; no lymphadenopathy.    RESP  Decreased BS in bases w/ no wheezing , no accessory muscle use, no dullness to percussion  CARD:  RRR, no m/r/g, tr to none peripheral edema, pulses intact, no cyanosis or clubbing.  GI:   Soft & nt; nml bowel sounds; no organomegaly or masses detected.   Musco: Warm bil, no deformities or joint swelling noted.   Neuro: alert, no focal deficits noted.    Skin: Warm, no lesions or rashes    Lab Results:  CBC  BNP No results found for: BNP  ProBNP  Imaging: No results found.   Assessment & Plan:   COPD, severe Recurrent flare w/ URI /AR  Begin Abx  Steroids to have on hold if sx/wheezing worsen/not resolve  Expand copd tx w/ symbicort in place of asmanex.   Plan  Patient Instructions  Doxycycline 100mg  Twice daily  For 7 days , wear sunscreen . Take w/ food.  Prednisone taper to have on hold if symptoms worsen with wheezing .  Stop Asmanex.  Begin Symbicort 160 2 puffs Twice daily  , rinse after use.  Continue on Spiriva daily.  Continue on Oxygen 2l/m .  Continue on CPAP At bedtime  . -Get download for next visit .  Follow up with in 2 months and As needed   Please contact office for sooner follow up if symptoms do not improve or worsen or seek emergency care      OSA on CPAP Cont on CPAP At bedtime   Check download on return   RESPIRATORY FAILURE, CHRONIC Cont on o2 .      Rubye Oaks, NP 12/11/2016

## 2016-12-12 ENCOUNTER — Telehealth (HOSPITAL_COMMUNITY): Payer: Self-pay

## 2016-12-12 NOTE — Telephone Encounter (Signed)
I was calling to follow up with patient from our conversation a month ago. Patient asked that I call back in a month. I called and left message on patient voicemail to call office about scheduling orientation and cardiac rehab classes. I left my contact information on patient voicemail to return my call.

## 2016-12-23 ENCOUNTER — Other Ambulatory Visit: Payer: Medicare Other

## 2016-12-23 ENCOUNTER — Other Ambulatory Visit (INDEPENDENT_AMBULATORY_CARE_PROVIDER_SITE_OTHER): Payer: Medicare Other

## 2016-12-23 DIAGNOSIS — I2510 Atherosclerotic heart disease of native coronary artery without angina pectoris: Secondary | ICD-10-CM

## 2016-12-23 DIAGNOSIS — Z931 Gastrostomy status: Secondary | ICD-10-CM | POA: Diagnosis not present

## 2016-12-24 ENCOUNTER — Telehealth: Payer: Self-pay | Admitting: *Deleted

## 2016-12-24 LAB — LIPID PANEL
CHOLESTEROL TOTAL: 123 mg/dL (ref 100–199)
Chol/HDL Ratio: 3.3 ratio (ref 0.0–5.0)
HDL: 37 mg/dL — AB (ref >39)
LDL CALC: 65 mg/dL (ref 0–99)
Triglycerides: 104 mg/dL (ref 0–149)
VLDL Cholesterol Cal: 21 mg/dL (ref 5–40)

## 2016-12-24 LAB — CBC WITH DIFFERENTIAL/PLATELET
BASOS ABS: 0 10*3/uL (ref 0.0–0.2)
Basos: 0 %
EOS (ABSOLUTE): 0.3 10*3/uL (ref 0.0–0.4)
Eos: 3 %
Hematocrit: 34.6 % — ABNORMAL LOW (ref 37.5–51.0)
Hemoglobin: 11 g/dL — ABNORMAL LOW (ref 13.0–17.7)
IMMATURE GRANS (ABS): 0 10*3/uL (ref 0.0–0.1)
IMMATURE GRANULOCYTES: 0 %
LYMPHS: 13 %
Lymphocytes Absolute: 1.2 10*3/uL (ref 0.7–3.1)
MCH: 26.8 pg (ref 26.6–33.0)
MCHC: 31.8 g/dL (ref 31.5–35.7)
MCV: 84 fL (ref 79–97)
MONOS ABS: 1.3 10*3/uL — AB (ref 0.1–0.9)
Monocytes: 15 %
NEUTROS PCT: 69 %
Neutrophils Absolute: 6 10*3/uL (ref 1.4–7.0)
PLATELETS: 125 10*3/uL — AB (ref 150–379)
RBC: 4.1 x10E6/uL — ABNORMAL LOW (ref 4.14–5.80)
RDW: 15.6 % — AB (ref 12.3–15.4)
WBC: 8.8 10*3/uL (ref 3.4–10.8)

## 2016-12-24 LAB — HEPATIC FUNCTION PANEL
ALT: 17 IU/L (ref 0–44)
AST: 20 IU/L (ref 0–40)
Albumin: 4 g/dL (ref 3.5–4.8)
Alkaline Phosphatase: 53 IU/L (ref 39–117)
BILIRUBIN TOTAL: 0.8 mg/dL (ref 0.0–1.2)
BILIRUBIN, DIRECT: 0.27 mg/dL (ref 0.00–0.40)
TOTAL PROTEIN: 6.2 g/dL (ref 6.0–8.5)

## 2016-12-24 LAB — BASIC METABOLIC PANEL
BUN / CREAT RATIO: 17 (ref 10–24)
BUN: 16 mg/dL (ref 8–27)
CO2: 25 mmol/L (ref 18–29)
CREATININE: 0.95 mg/dL (ref 0.76–1.27)
Calcium: 8.8 mg/dL (ref 8.6–10.2)
Chloride: 100 mmol/L (ref 96–106)
GFR calc Af Amer: 90 mL/min/{1.73_m2} (ref >59)
GFR, EST NON AFRICAN AMERICAN: 78 mL/min/{1.73_m2} (ref >59)
Glucose: 160 mg/dL — ABNORMAL HIGH (ref 65–99)
POTASSIUM: 4.9 mmol/L (ref 3.5–5.2)
Sodium: 139 mmol/L (ref 134–144)

## 2016-12-24 NOTE — Telephone Encounter (Signed)
Received Lab Results from [facility] with Abnormal results, but mostly baseline for patient. Spoke with Sandford Craze for review and direction, as this patient is not Establishing Care until 12/31/16 with Dr. Carmelia Roller, who will return to the office on Friday. Ok to place in Dr. Hollie Beach folder with note attached RE: upcoming New Patient appointment: forwarded to provider [Wendling]/SLS 05/30

## 2016-12-25 ENCOUNTER — Telehealth (HOSPITAL_COMMUNITY): Payer: Self-pay

## 2016-12-25 ENCOUNTER — Inpatient Hospital Stay (HOSPITAL_COMMUNITY)
Admission: EM | Admit: 2016-12-25 | Discharge: 2016-12-28 | DRG: 194 | Disposition: A | Discharge status: Home / Self Care | Payer: Medicare Other | Attending: Internal Medicine | Admitting: Internal Medicine

## 2016-12-25 ENCOUNTER — Encounter (HOSPITAL_COMMUNITY): Payer: Self-pay | Admitting: Emergency Medicine

## 2016-12-25 ENCOUNTER — Emergency Department (HOSPITAL_COMMUNITY): Payer: Medicare Other

## 2016-12-25 DIAGNOSIS — I2510 Atherosclerotic heart disease of native coronary artery without angina pectoris: Secondary | ICD-10-CM

## 2016-12-25 DIAGNOSIS — F329 Major depressive disorder, single episode, unspecified: Secondary | ICD-10-CM | POA: Diagnosis present

## 2016-12-25 DIAGNOSIS — Z9981 Dependence on supplemental oxygen: Secondary | ICD-10-CM

## 2016-12-25 DIAGNOSIS — N401 Enlarged prostate with lower urinary tract symptoms: Secondary | ICD-10-CM | POA: Diagnosis present

## 2016-12-25 DIAGNOSIS — R3911 Hesitancy of micturition: Secondary | ICD-10-CM | POA: Diagnosis present

## 2016-12-25 DIAGNOSIS — F4310 Post-traumatic stress disorder, unspecified: Secondary | ICD-10-CM | POA: Diagnosis present

## 2016-12-25 DIAGNOSIS — Z87891 Personal history of nicotine dependence: Secondary | ICD-10-CM

## 2016-12-25 DIAGNOSIS — Z7951 Long term (current) use of inhaled steroids: Secondary | ICD-10-CM

## 2016-12-25 DIAGNOSIS — J181 Lobar pneumonia, unspecified organism: Secondary | ICD-10-CM | POA: Diagnosis not present

## 2016-12-25 DIAGNOSIS — F419 Anxiety disorder, unspecified: Secondary | ICD-10-CM | POA: Diagnosis present

## 2016-12-25 DIAGNOSIS — M069 Rheumatoid arthritis, unspecified: Secondary | ICD-10-CM | POA: Diagnosis present

## 2016-12-25 DIAGNOSIS — G4733 Obstructive sleep apnea (adult) (pediatric): Secondary | ICD-10-CM | POA: Diagnosis not present

## 2016-12-25 DIAGNOSIS — J9610 Chronic respiratory failure, unspecified whether with hypoxia or hypercapnia: Secondary | ICD-10-CM | POA: Diagnosis present

## 2016-12-25 DIAGNOSIS — I110 Hypertensive heart disease with heart failure: Secondary | ICD-10-CM | POA: Diagnosis present

## 2016-12-25 DIAGNOSIS — Z79899 Other long term (current) drug therapy: Secondary | ICD-10-CM

## 2016-12-25 DIAGNOSIS — Z7982 Long term (current) use of aspirin: Secondary | ICD-10-CM

## 2016-12-25 DIAGNOSIS — A419 Sepsis, unspecified organism: Secondary | ICD-10-CM | POA: Insufficient documentation

## 2016-12-25 DIAGNOSIS — Z7984 Long term (current) use of oral hypoglycemic drugs: Secondary | ICD-10-CM

## 2016-12-25 DIAGNOSIS — I447 Left bundle-branch block, unspecified: Secondary | ICD-10-CM | POA: Diagnosis present

## 2016-12-25 DIAGNOSIS — Z9989 Dependence on other enabling machines and devices: Secondary | ICD-10-CM

## 2016-12-25 DIAGNOSIS — I255 Ischemic cardiomyopathy: Secondary | ICD-10-CM | POA: Diagnosis present

## 2016-12-25 DIAGNOSIS — R197 Diarrhea, unspecified: Secondary | ICD-10-CM | POA: Diagnosis present

## 2016-12-25 DIAGNOSIS — Z955 Presence of coronary angioplasty implant and graft: Secondary | ICD-10-CM

## 2016-12-25 DIAGNOSIS — I5022 Chronic systolic (congestive) heart failure: Secondary | ICD-10-CM | POA: Diagnosis not present

## 2016-12-25 DIAGNOSIS — R338 Other retention of urine: Secondary | ICD-10-CM | POA: Diagnosis present

## 2016-12-25 DIAGNOSIS — J189 Pneumonia, unspecified organism: Principal | ICD-10-CM | POA: Diagnosis present

## 2016-12-25 DIAGNOSIS — Z794 Long term (current) use of insulin: Secondary | ICD-10-CM

## 2016-12-25 DIAGNOSIS — H9192 Unspecified hearing loss, left ear: Secondary | ICD-10-CM | POA: Diagnosis present

## 2016-12-25 DIAGNOSIS — E785 Hyperlipidemia, unspecified: Secondary | ICD-10-CM | POA: Diagnosis present

## 2016-12-25 DIAGNOSIS — E1165 Type 2 diabetes mellitus with hyperglycemia: Secondary | ICD-10-CM | POA: Diagnosis present

## 2016-12-25 DIAGNOSIS — E039 Hypothyroidism, unspecified: Secondary | ICD-10-CM | POA: Diagnosis present

## 2016-12-25 DIAGNOSIS — J449 Chronic obstructive pulmonary disease, unspecified: Secondary | ICD-10-CM | POA: Diagnosis not present

## 2016-12-25 DIAGNOSIS — J440 Chronic obstructive pulmonary disease with acute lower respiratory infection: Secondary | ICD-10-CM | POA: Diagnosis present

## 2016-12-25 LAB — CBC WITH DIFFERENTIAL/PLATELET
BASOS ABS: 0 10*3/uL (ref 0.0–0.1)
Basophils Relative: 0 %
EOS PCT: 3 %
Eosinophils Absolute: 0.3 10*3/uL (ref 0.0–0.7)
HEMATOCRIT: 32.7 % — AB (ref 39.0–52.0)
HEMOGLOBIN: 10.4 g/dL — AB (ref 13.0–17.0)
LYMPHS ABS: 1.2 10*3/uL (ref 0.7–4.0)
LYMPHS PCT: 13 %
MCH: 27.2 pg (ref 26.0–34.0)
MCHC: 31.8 g/dL (ref 30.0–36.0)
MCV: 85.4 fL (ref 78.0–100.0)
MONOS PCT: 13 %
Monocytes Absolute: 1.2 10*3/uL — ABNORMAL HIGH (ref 0.1–1.0)
NEUTROS ABS: 6.7 10*3/uL (ref 1.7–7.7)
Neutrophils Relative %: 71 %
Platelets: 118 10*3/uL — ABNORMAL LOW (ref 150–400)
RBC: 3.83 MIL/uL — ABNORMAL LOW (ref 4.22–5.81)
RDW: 14.8 % (ref 11.5–15.5)
WBC: 9.4 10*3/uL (ref 4.0–10.5)

## 2016-12-25 LAB — BASIC METABOLIC PANEL
Anion gap: 10 (ref 5–15)
BUN: 20 mg/dL (ref 6–20)
CHLORIDE: 100 mmol/L — AB (ref 101–111)
CO2: 25 mmol/L (ref 22–32)
Calcium: 8.8 mg/dL — ABNORMAL LOW (ref 8.9–10.3)
Creatinine, Ser: 1.03 mg/dL (ref 0.61–1.24)
GFR calc Af Amer: >60 mL/min (ref >60)
GLUCOSE: 248 mg/dL — AB (ref 65–99)
POTASSIUM: 4.2 mmol/L (ref 3.5–5.1)
Sodium: 135 mmol/L (ref 135–145)

## 2016-12-25 LAB — URINALYSIS, ROUTINE W REFLEX MICROSCOPIC
BACTERIA UA: NONE SEEN
BILIRUBIN URINE: NEGATIVE
Glucose, UA: 50 mg/dL — AB
HGB URINE DIPSTICK: NEGATIVE
KETONES UR: NEGATIVE mg/dL
Leukocytes, UA: NEGATIVE
NITRITE: NEGATIVE
PROTEIN: 30 mg/dL — AB
Specific Gravity, Urine: 1.019 (ref 1.005–1.030)
Squamous Epithelial / LPF: NONE SEEN
pH: 5 (ref 5.0–8.0)

## 2016-12-25 LAB — I-STAT CG4 LACTIC ACID, ED: Lactic Acid, Venous: 2.65 mmol/L (ref 0.5–1.9)

## 2016-12-25 LAB — LACTIC ACID, PLASMA: Lactic Acid, Venous: 0.7 mmol/L (ref 0.5–1.9)

## 2016-12-25 LAB — GLUCOSE, CAPILLARY: Glucose-Capillary: 160 mg/dL — ABNORMAL HIGH (ref 65–99)

## 2016-12-25 MED ORDER — GABAPENTIN 400 MG PO CAPS
800.0000 mg | ORAL_CAPSULE | Freq: Two times a day (BID) | ORAL | Status: DC
  Administered 2016-12-25 – 2016-12-28 (×6): 800 mg via ORAL
  Filled 2016-12-25 (×6): qty 2

## 2016-12-25 MED ORDER — INSULIN GLARGINE 100 UNIT/ML ~~LOC~~ SOLN
55.0000 [IU] | Freq: Every day | SUBCUTANEOUS | Status: DC
  Administered 2016-12-25 – 2016-12-27 (×2): 55 [IU] via SUBCUTANEOUS
  Filled 2016-12-25 (×4): qty 0.55

## 2016-12-25 MED ORDER — ALPRAZOLAM 0.5 MG PO TABS
0.5000 mg | ORAL_TABLET | Freq: Every day | ORAL | Status: DC
  Administered 2016-12-25 – 2016-12-27 (×3): 0.5 mg via ORAL
  Filled 2016-12-25 (×3): qty 1

## 2016-12-25 MED ORDER — LEVOTHYROXINE SODIUM 100 MCG PO TABS
200.0000 ug | ORAL_TABLET | Freq: Every day | ORAL | Status: DC
  Administered 2016-12-26 – 2016-12-28 (×3): 200 ug via ORAL
  Filled 2016-12-25 (×3): qty 2

## 2016-12-25 MED ORDER — INSULIN ASPART 100 UNIT/ML ~~LOC~~ SOLN
0.0000 [IU] | Freq: Three times a day (TID) | SUBCUTANEOUS | Status: DC
  Administered 2016-12-26: 2 [IU] via SUBCUTANEOUS
  Administered 2016-12-26: 11 [IU] via SUBCUTANEOUS
  Administered 2016-12-27: 5 [IU] via SUBCUTANEOUS
  Administered 2016-12-27: 3 [IU] via SUBCUTANEOUS
  Administered 2016-12-27: 8 [IU] via SUBCUTANEOUS
  Administered 2016-12-28: 11 [IU] via SUBCUTANEOUS
  Administered 2016-12-28: 3 [IU] via SUBCUTANEOUS

## 2016-12-25 MED ORDER — DEXTROSE 5 % IV SOLN
500.0000 mg | INTRAVENOUS | Status: DC
  Administered 2016-12-25: 500 mg via INTRAVENOUS
  Filled 2016-12-25: qty 500

## 2016-12-25 MED ORDER — LOSARTAN POTASSIUM 50 MG PO TABS
50.0000 mg | ORAL_TABLET | Freq: Every day | ORAL | Status: DC
  Administered 2016-12-25 – 2016-12-28 (×4): 50 mg via ORAL
  Filled 2016-12-25 (×4): qty 1

## 2016-12-25 MED ORDER — SERTRALINE HCL 50 MG PO TABS
50.0000 mg | ORAL_TABLET | Freq: Every day | ORAL | Status: DC
  Administered 2016-12-25 – 2016-12-28 (×4): 50 mg via ORAL
  Filled 2016-12-25 (×4): qty 1

## 2016-12-25 MED ORDER — SODIUM CHLORIDE 0.9 % IV SOLN
1000.0000 mL | INTRAVENOUS | Status: DC
  Administered 2016-12-25: 1000 mL via INTRAVENOUS

## 2016-12-25 MED ORDER — HYPROMELLOSE (GONIOSCOPIC) 2.5 % OP SOLN
1.0000 [drp] | Freq: Every day | OPHTHALMIC | Status: DC | PRN
  Filled 2016-12-25: qty 15

## 2016-12-25 MED ORDER — ASPIRIN EC 81 MG PO TBEC
81.0000 mg | DELAYED_RELEASE_TABLET | Freq: Every day | ORAL | Status: DC
  Administered 2016-12-25 – 2016-12-28 (×4): 81 mg via ORAL
  Filled 2016-12-25 (×4): qty 1

## 2016-12-25 MED ORDER — MOMETASONE FURO-FORMOTEROL FUM 200-5 MCG/ACT IN AERO
2.0000 | INHALATION_SPRAY | Freq: Two times a day (BID) | RESPIRATORY_TRACT | Status: DC
  Administered 2016-12-25 – 2016-12-28 (×6): 2 via RESPIRATORY_TRACT
  Filled 2016-12-25: qty 8.8

## 2016-12-25 MED ORDER — INSULIN ASPART 100 UNIT/ML ~~LOC~~ SOLN
0.0000 [IU] | Freq: Every day | SUBCUTANEOUS | Status: DC
  Administered 2016-12-27: 2 [IU] via SUBCUTANEOUS

## 2016-12-25 MED ORDER — ATORVASTATIN CALCIUM 40 MG PO TABS
40.0000 mg | ORAL_TABLET | Freq: Every day | ORAL | Status: DC
  Administered 2016-12-25 – 2016-12-28 (×4): 40 mg via ORAL
  Filled 2016-12-25 (×4): qty 1

## 2016-12-25 MED ORDER — SPIRONOLACTONE 25 MG PO TABS
25.0000 mg | ORAL_TABLET | Freq: Every day | ORAL | Status: DC
  Administered 2016-12-25 – 2016-12-28 (×4): 25 mg via ORAL
  Filled 2016-12-25 (×4): qty 1

## 2016-12-25 MED ORDER — TAMSULOSIN HCL 0.4 MG PO CAPS
0.4000 mg | ORAL_CAPSULE | Freq: Every day | ORAL | Status: DC
  Administered 2016-12-25: 0.4 mg via ORAL
  Filled 2016-12-25: qty 1

## 2016-12-25 MED ORDER — AZITHROMYCIN 250 MG PO TABS
500.0000 mg | ORAL_TABLET | ORAL | Status: DC
  Administered 2016-12-26 – 2016-12-28 (×3): 500 mg via ORAL
  Filled 2016-12-25 (×3): qty 2

## 2016-12-25 MED ORDER — DEXTROSE 5 % IV SOLN
1.0000 g | INTRAVENOUS | Status: DC
  Administered 2016-12-25 – 2016-12-28 (×4): 1 g via INTRAVENOUS
  Filled 2016-12-25 (×4): qty 10

## 2016-12-25 MED ORDER — BISOPROLOL FUMARATE 5 MG PO TABS
5.0000 mg | ORAL_TABLET | Freq: Every day | ORAL | Status: DC
  Administered 2016-12-25 – 2016-12-28 (×4): 5 mg via ORAL
  Filled 2016-12-25 (×4): qty 1

## 2016-12-25 MED ORDER — CLOPIDOGREL BISULFATE 75 MG PO TABS
75.0000 mg | ORAL_TABLET | Freq: Every day | ORAL | Status: DC
  Administered 2016-12-26 – 2016-12-28 (×3): 75 mg via ORAL
  Filled 2016-12-25 (×3): qty 1

## 2016-12-25 MED ORDER — ALBUTEROL SULFATE (2.5 MG/3ML) 0.083% IN NEBU: 2.5000 mg | INHALATION_SOLUTION | RESPIRATORY_TRACT | Status: DC | PRN

## 2016-12-25 MED ORDER — ENOXAPARIN SODIUM 40 MG/0.4ML ~~LOC~~ SOLN
40.0000 mg | SUBCUTANEOUS | Status: DC
  Administered 2016-12-25 – 2016-12-27 (×3): 40 mg via SUBCUTANEOUS
  Filled 2016-12-25 (×3): qty 0.4

## 2016-12-25 MED ORDER — TIOTROPIUM BROMIDE MONOHYDRATE 18 MCG IN CAPS
18.0000 ug | ORAL_CAPSULE | Freq: Every day | RESPIRATORY_TRACT | Status: DC
  Administered 2016-12-25 – 2016-12-28 (×4): 18 ug via RESPIRATORY_TRACT
  Filled 2016-12-25: qty 5

## 2016-12-25 NOTE — ED Notes (Signed)
Went to draw pt repeat lactic however pt has already been transported to [facility].

## 2016-12-25 NOTE — ED Provider Notes (Signed)
[facility]-EMERGENCY DEPT Provider Note   CSN: [id number] Arrival date & time: 12/25/16  1315     History   Chief Complaint Chief Complaint  Patient presents with  . Urinary Retention    HPI [name] is a 76 y.o. male.  HPI 76 year old Caucasian male past medical history includes CAD status post PCI in 2 months ago, COPD currently on 3 L of oxygen at home, cardiomyopathy, BPH, diabetes that presents to the emergency Department today with complaints of urinary retention and productive cough. Patient was seen in urgent care prior to coming to the ED if concern for sepsis and was sent to the ED for evaluation. The patient states that over the past 2 days he has had dysuria, increased urgency and frequency. States he has no history of UTI 3 years ago but this does not feel similar. He denies any hematuria. Was concerned for possible urinary tract infection. Patient also states that he had significant amount of dark brown sputum this morning during coughing spell. Was seen by his pulmonologist last week for increased sputum production and cough and was started on a Z-Pak and prednisone with little relief. The patient has not tried any further for his symptoms. Patient denies chest pain or shortness of breath. Denies any lower extremity edema or calf tenderness. On report by urgent care patient was febrile 100.7 orally. Here patient has a rectal temp of 100.3. Patient reports chills but has not taken his temperature at home. Has been taking Tylenol for his chills and pain. Patient denies any vision changes, headache, lightheadedness, dizziness, nausea, vomiting, diarrhea, paresthesias.  Past Medical History:  Diagnosis Date  . Anxiety   . BENIGN PROSTATIC HYPERTROPHY 11/23/2009  . C O P D 07/30/2009  . Cardiomyopathy (HCC) 08/28/2016  . CHRONIC OBSTRUCTIVE PULMONARY DISEASE, ACUTE EXACERBATION 03/01/2010  . CORONARY ARTERY DISEASE 11/23/2009  . 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 DEGENERATIVE  JOINT DISEASE 11/23/2009  . DEPRESSION 11/23/2009  . DYSPNEA/SHORTNESS OF BREATH 12/08/2009  . FATIGUE 11/23/2009  . GAIT DISTURBANCE 12/10/2009  . HEMOPTYSIS UNSPECIFIED 05/07/2010  . High cholesterol   . HYPERTENSION 07/30/2009  . HYPOTHYROIDISM 07/30/2009  . 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 On home oxygen therapy    "2-3L; 24/7" (09/10/2016)  . OSA on CPAP   . Pneumonia X 2  . PTSD (post-traumatic stress disorder) 03/10/2012  . 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 RA (rheumatoid arthritis) (HCC) 06/11/2011   "qwhere" (09/10/2016)  . RESPIRATORY FAILURE, CHRONIC 07/31/2009  . Scleritis of both eyes 03/17/2014  . THRUSH 11/06/2009  . 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 Type II diabetes mellitus [facility])     Patient Active Problem List   Diagnosis Date Noted  . Acute bronchitis 10/28/2016  . Acute on chronic combined systolic and diastolic CHF (congestive heart failure) (HCC) 09/11/2016  . Unstable angina (HCC)   . Cardiomyopathy (HCC) 08/28/2016  . Urinary urgency 07/10/2016  . Shoulder arthritis 06/12/2016  . Polyarthralgia 04/04/2016  . 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 Corn of foot 10/02/2015  . Post-nasal drip 12/11/2014  . COPD, severe (HCC) 08/04/2014  . Dysuria 05/04/2014  . Scleritis of both eyes 03/17/2014  . Hyperlipidemia 01/05/2014  . Ileus (HCC) 08/22/2013  . Community acquired pneumonia 07/19/2013  . COPD exacerbation (HCC) 05/31/2012  . Obesity 04/20/2012  . PTSD (post-traumatic stress disorder) 03/10/2012  . Orthostatic hypotension 02/02/2012  . Syncope 01/26/2012  . RA (rheumatoid arthritis) (HCC) 06/11/2011  . OSA on CPAP 06/10/2011  .  LBBB (left bundle branch block) 03/25/2011  . Diastolic dysfunction 02/25/2011  . Acute and chronic respiratory failure 02/21/2011  . Insomnia 12/05/2010  . Erectile dysfunction 12/05/2010  . Preventative health care 12/04/2010  . 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 GAIT  DISTURBANCE 12/10/2009  . Diabetes (HCC) 11/23/2009  . Depression 11/23/2009  . Coronary atherosclerosis 11/23/2009  . BENIGN PROSTATIC HYPERTROPHY 11/23/2009  . DEGENERATIVE JOINT DISEASE 11/23/2009  . FATIGUE 11/23/2009  . 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 RESPIRATORY FAILURE, CHRONIC 07/31/2009  . HYPOTHYROIDISM 07/30/2009  . Essential hypertension 07/30/2009  . COPD (chronic obstructive pulmonary disease) (HCC) 07/30/2009    Past Surgical History:  Procedure Laterality Date  . 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 ABDOMINAL EXPLORATION SURGERY  02/2004   w/LOA/notes 12/10/2010; small bowel obstruction repair with adhesiolysis   . BACK SURGERY    . CARDIAC CATHETERIZATION     2 heart caths in the past.  One in 2000s showed one ulcerated plaque  Rx medically; Second at [facility] [name] 09/05/2016  . CATARACT EXTRACTION W/ INTRAOCULAR LENS  IMPLANT, BILATERAL Bilateral 2000s  . COLECTOMY     hx of remote ileum resection due to bleeding  . CORONARY ANGIOPLASTY WITH STENT PLACEMENT  09/10/2016  . CORONARY STENT INTERVENTION N/A 09/10/2016   Procedure: Coronary Stent Intervention;  Surgeon: Kathleene Hazel, MD;  Location: [facility] INVASIVE CV LAB;  Service: Cardiovascular;  Laterality: N/A;  Distal RCA 4.0x16 Synergy  . FEMORAL EMBOLOECTOMY Left 07/2000   with left leg ischemia; Dr. Hart Rochester, vascular  . GANGLION CYST EXCISION Right    "wrist"; Dr. Teressa Senter  . LUMBAR LAMINECTOMY  1972   Dr. Fannie Knee  . RIGHT/LEFT HEART CATH AND CORONARY ANGIOGRAPHY N/A 09/10/2016   Procedure: Right/Left Heart Cath and Coronary Angiography;  Surgeon: Kathleene Hazel, MD;  Location: [facility] INVASIVE CV LAB;  Service: Cardiovascular;  Laterality: N/A;  . TONSILLECTOMY         Home Medications    Prior to Admission medications   Medication Sig Start Date End Date Taking? Authorizing Provider  acetaminophen (TYLENOL) 325 MG tablet Take 325-650 mg by mouth daily as needed for moderate pain or  headache.    [provider]  albuterol (PROAIR HFA) 108 (90 BASE) MCG/ACT inhaler Inhale 2 puffs into the lungs every 4 (four) hours as needed. Shortness of breath 10/09/11   Kalman Shan, MD  albuterol (PROVENTIL) (2.5 MG/3ML) 0.083% nebulizer solution 1 vial in nebulizer every 6 hours as needed Dx 496 05/31/12   Kalman Shan, MD  ALPRAZolam Prudy Feeler) 0.5 MG tablet Take 0.5 mg by mouth at bedtime. For sleep and anxiety    [provider]  aspirin EC 81 MG tablet Take 1 tablet (81 mg total) by mouth daily. 09/17/16   Bhagat, Sharrell Ku, PA  atorvastatin (LIPITOR) 40 MG tablet Take 1 tablet (40 mg total) by mouth daily. 10/21/16   Pricilla Riffle, MD  bisoprolol (ZEBETA) 5 MG tablet Take 1 tablet (5 mg total) by mouth daily. 09/11/16   Strader, Lennart Pall, PA-C  budesonide-formoterol (SYMBICORT) 160-4.5 MCG/ACT inhaler Inhale 2 puffs into the lungs 2 (two) times daily. 12/11/16   Parrett, Virgel Bouquet, NP  budesonide-formoterol (SYMBICORT) 160-4.5 MCG/ACT inhaler Inhale 2 puffs into the lungs 2 (two) times daily. 12/11/16   Parrett, Virgel Bouquet, NP  Carboxymethylcellul-Glycerin (LUBRICATING EYE DROPS OP) Apply 1 drop to eye daily as needed (dry eyes).    [provider]  Cholecalciferol (VITAMIN D3) 2000 units capsule Take 2,000 Units by mouth daily.  01/11/16   [provider]  clopidogrel (PLAVIX) 75 MG tablet Take 1 tablet (75 mg total) by mouth daily with breakfast. 09/11/16   [place] Ouch, Lennart Pall, PA-C  doxycycline (VIBRA-TABS) 100 MG tablet Take 1 tablet (100 mg total) by mouth 2 (two) times daily. 12/11/16   Parrett, Virgel Bouquet, NP  fluticasone (FLONASE) 50 MCG/ACT nasal spray USE 2 SPRAYS IN EACH NOSTRIL DAILY Patient taking differently: USE 2 SPRAYS IN EACH NOSTRIL DAILY AS NEEDED FOR CONGESTION 05/23/16   Kalman Shan, MD  furosemide (LASIX) 40 MG tablet TK 1 T PO D 09/11/16   [provider]  gabapentin (NEURONTIN) 800 MG tablet Take 800 mg by mouth 2  (two) times daily.      [provider]  insulin aspart (NOVOLOG) 100 UNIT/ML injection Inject 6-8 Units into the skin daily with supper. Sliding scale 01/11/16   [provider]  insulin glargine (LANTUS) 100 UNIT/ML injection Inject 0.7 mLs (70 Units total) into the skin at bedtime. 10/03/16   Corwin Levins, MD  leflunomide (ARAVA) 20 MG tablet Take 20 mg by mouth daily.     [provider]  levothyroxine (SYNTHROID, LEVOTHROID) 200 MCG tablet Take 200 mcg by mouth daily before breakfast.    [provider]  Liraglutide (VICTOZA) 18 MG/3ML SOPN Inject 1.2 mg into the skin at bedtime.     [provider]  losartan (COZAAR) 50 MG tablet Take 1 tablet (50 mg total) by mouth daily. 08/24/13   Zannie Cove, MD  metFORMIN (GLUCOPHAGE) 1000 MG tablet Take 1,000 mg by mouth 2 (two) times daily with a meal.    [provider]  Multiple Vitamins-Minerals (MEGA MULTIVITAMIN FOR MEN PO) Take 1 tablet by mouth daily.     [provider]  OXYGEN Inhale 2 L into the lungs continuous.    [provider]  predniSONE (DELTASONE) 10 MG tablet 4 tabs for 2 days, then 3 tabs for 2 days, 2 tabs for 2 days, then 1 tab for 2 days, then stop 12/11/16   Parrett, Virgel Bouquet, NP  predniSONE (DELTASONE) 20 MG tablet Take 1 tablet (20 mg total) by mouth daily with breakfast. 10/28/16   de Oran, [place] A, MD  sertraline (ZOLOFT) 100 MG tablet Take 50 mg by mouth daily.     [provider]  spironolactone (ALDACTONE) 25 MG tablet Take 1 tablet (25 mg total) by mouth daily. 09/18/16 12/17/16  Manson Passey, PA  Tamsulosin HCl (FLOMAX) 0.4 MG CAPS Take 0.4 mg by mouth at bedtime.     [provider]  tiotropium (SPIRIVA HANDIHALER) 18 MCG inhalation capsule Place 18 mcg into inhaler and inhale daily.      [provider]    Family History Family History  Problem Relation Age of Onset  . Other Mother        gun shot     Social History Social History  Substance Use Topics  . Smoking status: Former Smoker    Packs/day: 2.50    Years: 40.00    Types: Cigarettes, Pipe, Cigars    Quit date: 07/28/1998  . Smokeless tobacco: Never Used  . Alcohol use No     Allergies   Patient has no known allergies.   Review of Systems Review of Systems  Constitutional: Positive for chills. Negative for fever.  HENT: Negative for congestion.   Eyes: Negative for  visual disturbance.  Respiratory: Positive for cough. Negative for shortness of breath and wheezing.   Cardiovascular: Negative for chest pain, palpitations and leg swelling.  Gastrointestinal: Negative for abdominal pain, diarrhea and nausea.  Genitourinary: Positive for dysuria, frequency and urgency. Negative for flank pain and hematuria.  Musculoskeletal: Negative for back pain.  Skin: Negative.   Neurological: Negative for dizziness, syncope, weakness, light-headedness and headaches.     Physical Exam Updated Vital Signs BP (!) 120/50 (BP Location: Left Arm)   Pulse 89   Temp 100.3 F (37.9 C) (Rectal)   Resp 18   Ht 6\' 3"  (1.905 m)   Wt 100.7 kg (222 lb)   BMI 27.75 kg/m   Physical Exam  Constitutional: He is oriented to person, place, and time. He appears well-developed and well-nourished. No distress.  Elderly gentle man that appears chronically ill  HENT:  Head: Normocephalic and atraumatic.  Mouth/Throat: Oropharynx is clear and moist.  Eyes: Conjunctivae are normal. Right eye exhibits no discharge. Left eye exhibits no discharge. No scleral icterus.  Neck: Normal range of motion. Neck supple. No thyromegaly present.  Cardiovascular: Normal rate, regular rhythm, normal heart sounds and intact distal pulses.  Exam reveals no gallop and no friction rub.   No murmur heard. Pulmonary/Chest: Effort normal and breath sounds normal. No respiratory distress. He exhibits no tenderness.  Coarse sounds noted in the left upper lung,  otherwise decreased  breath sounds throughout.  Abdominal: Soft. Bowel sounds are normal. He exhibits no distension. There is no tenderness. There is no rebound and no guarding.  No cva tenderness  Musculoskeletal: Normal range of motion.  Lymphadenopathy:    He has no cervical adenopathy.  Neurological: He is alert and oriented to person, place, and time.  Skin: Skin is warm and dry. Capillary refill takes less than 2 seconds.  Nursing note and vitals reviewed.    ED Treatments / Results  Labs (all labs ordered are listed, but only abnormal results are displayed) Labs Reviewed  URINALYSIS, ROUTINE W REFLEX MICROSCOPIC - Abnormal; Notable for the following:       Result Value   Glucose, UA 50 (*)    Protein, ur 30 (*)    All other components within normal limits  BASIC METABOLIC PANEL - Abnormal; Notable for the following:    Chloride 100 (*)    Glucose, Bld 248 (*)    Calcium 8.8 (*)    All other components within normal limits  CBC WITH DIFFERENTIAL/PLATELET - Abnormal; Notable for the following:    RBC 3.83 (*)    Hemoglobin 10.4 (*)    HCT 32.7 (*)    Platelets 118 (*)    Monocytes Absolute 1.2 (*)    All other components within normal limits  I-STAT CG4 LACTIC ACID, ED - Abnormal; Notable for the following:    Lactic Acid, Venous 2.65 (*)    All other components within normal limits  CULTURE, BLOOD (ROUTINE X 2)  CULTURE, BLOOD (ROUTINE X 2)  URINE CULTURE    EKG  EKG Interpretation None       Radiology Dg Chest 2 View  Result Date: 12/25/2016 CLINICAL DATA:  Low-grade fever and chills. Ex-smoker. COPD. Previous coronary stent placement. EXAM: CHEST  2 VIEW COMPARISON:  10/28/2016. FINDINGS: Normal sized heart. Bilateral pleural thickening without significant change. Interval left upper lobe airspace opacity. Stable prominence of the interstitial markings bilaterally and linear scarring on the right. The hemidiaphragms remain flattened. Unremarkable bones.  IMPRESSION: 1.  Left upper lobe pneumonia. 2. 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 Electronically Signed   By: Beckie Salts M.D.   On: 12/25/2016 15:26    Procedures Procedures (including critical care time)  Medications Ordered in ED Medications  0.9 %  sodium chloride infusion (1,000 mLs Intravenous New Bag/Given 12/25/16 1533)  cefTRIAXone (ROCEPHIN) 1 g in dextrose 5 % 50 mL IVPB (0 g Intravenous Stopped 12/25/16 1624)  azithromycin (ZITHROMAX) 500 mg in dextrose 5 % 250 mL IVPB (500 mg Intravenous New Bag/Given 12/25/16 1626)     Initial Impression / Assessment and Plan / ED Course  I have reviewed the triage vital signs and the nursing notes.  Pertinent labs & imaging results that were available during my care of the patient were reviewed by me and considered in my medical decision making (see chart for details).     Patient presents to the ED after being seen at urgent care today for possible sepsis due to unknown source but possible UTI versus pneumonia. Patient was noted to be febrile at urgent care 100.7 orally. He does have a low-grade fever of 100.3 rectally in the ED. Heart rate is normal. Blood pressure is normal. Patient is on 3 L of oxygen at baseline for COPD. Patient was seen by his pulmonologist last week for productive cough and started on a Z-Pak and prednisone with little relief of the symptoms. Patient also notes urinary urgency, frequency, dysuria. Patient was sent to ED by urgent care or possible sepsis.  Patient is ill-appearing however he is nontoxic appearing and appears be in no acute distress. He does have coarse sounds in his left upper lobe on auscultation. Chest x-ray confirms left upper lobe pneumonia. UA shows no signs of infection. No leukocytosis is noted. Rectal temp is 100.3 in the ED. Pressures heart rate are normal. Lactate was noted to be 2.65. Sepsis protocol initiated. Blood cultures obtained prior to IV  anabiotic. Was started on azithromycin and Rocephin. EKG shows no significant change from prior tracing. Patient is currently he was stable in no acute distress. Unsure patient's urinary symptoms. Has a history of BPH with could be causing his symptoms.  Discussed with Dr. Malachi Bonds with hospital medicine who agrees to come to the ED to evaluated patient in place admission orders.  Pt seen and evaluated by Dr. Eudelia Bunch my attending is agreeable to the above plan.   Final Clinical Impressions(s) / ED Diagnoses   Final diagnoses:  Community acquired pneumonia of left upper lobe of lung (HCC)  Sepsis, due to unspecified organism [facility])    New Prescriptions New Prescriptions   No medications on file     Wallace Keller 12/25/16 1635    Nira Conn, MD 12/26/16 [phone]

## 2016-12-25 NOTE — H&P (Signed)
History and Physical    [name] LNZ:[id number] DOB: 1941/06/06 DOA: 12/25/2016  Referring MD/NP/PA: Demetrios Loll PCP: Corwin Levins, MD  Outpatient Specialists: Forest Gleason Patient coming from: home  Chief Complaint: chills, fevers, muscle aches, difficulty voiding  HPI: [name] is a 76 y.o. male with history of severe COPD, ischemic cardiomyopathy with ejection fraction of 35-40%, coronary artery disease with stent placement to the RCA in February 2018, rheumatoid arthritis, diabetes mellitus type 2, and hypothyroidism who presents with chills, fevers, muscle aches, difficulty voiding.  The patient was seen by pulmonology a proximally 2 weeks ago at which time he reported that he was having increased yellow phlegm. He was started on azithromycin and prednisone taper which did not affect his sputum production. Despite taking these medications he developed worsening chills and fatigue and over the last week since he has stopped these medications he has developed low-grade fevers and worsening muscle aches. He denies significant increase in shortness of breath and remains on his usual 2 L home oxygen and has been able to tolerate his CPAP machine at night. He presented to urgent care today because he has had a noticeable decrease in his urinary stream with difficulty getting his stream started and he felt that he was retaining urine. At urgent care, he had low-grade fever and he was referred to the emergency department due to concerns for sepsis due to UTI. He denies dysuria but has had increased frequency and urgency. He denies upper respiratory symptoms, nausea, vomiting, diarrhea.  ED Course: Temperature 100.3 Fahrenheit, respirations 24, blood pressure stable to mildly elevated. Oxygen saturations in the high 80s to low 90s on 2 L nasal cannula. WBC 9.4.  Lactic acid 2.65.  Chest x-ray demonstrated a left upper lobe pneumonia. The patient is being admitted for  community-acquired pneumonia but did not respond to outpatient antibiotics.  Review of Systems:  General:  Positive fevers, chills, 30 pound weight loss over the last several months HEENT:  Denies changes to hearing and vision, rhinorrhea, sinus congestion, sore throat CV:  Denies chest pain and palpitations, transient lower extremity edema a few days ago that self resolved PULM: Per history of present illness  GI:  Denies nausea, vomiting, constipation, diarrhea.   GU:  Denies dysuria, positive frequency, urgency ENDO:  Denies polyuria, polydipsia.   HEME:  Denies hematemesis, blood in stools, melena, abnormal bruising or bleeding.  LYMPH:  Denies lymphadenopathy.   MSK:  Denies arthralgias, positive myalgias.   DERM:  Denies skin rash or ulcer.   NEURO:  Denies focal numbness, weakness, slurred speech, confusion, facial droop.  PSYCH:  Denies anxiety and depression.    Past Medical History:  Diagnosis Date  . Anxiety   . BENIGN PROSTATIC HYPERTROPHY 11/23/2009  . C O P D 07/30/2009  . Cardiomyopathy (HCC) 08/28/2016  . CHRONIC OBSTRUCTIVE PULMONARY DISEASE, ACUTE EXACERBATION 03/01/2010  . CORONARY ARTERY DISEASE 11/23/2009  . 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 DEGENERATIVE JOINT DISEASE 11/23/2009  . DEPRESSION 11/23/2009  . DYSPNEA/SHORTNESS OF BREATH 12/08/2009  . FATIGUE 11/23/2009  . GAIT DISTURBANCE 12/10/2009  . HEMOPTYSIS UNSPECIFIED 05/07/2010  . High cholesterol   . HYPERTENSION 07/30/2009  . HYPOTHYROIDISM 07/30/2009  . 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 On home oxygen therapy    "2-3L; 24/7" (09/10/2016)  . OSA on CPAP   . Pneumonia X 2  . PTSD (post-traumatic stress disorder) 03/10/2012  . 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 RA (rheumatoid arthritis) (HCC) 06/11/2011   "  qwhere" (09/10/2016)  . RESPIRATORY FAILURE, CHRONIC 07/31/2009  . Scleritis of both eyes 03/17/2014  . THRUSH 11/06/2009  . 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 Type II diabetes mellitus (HCC)     Past Surgical  History:  Procedure Laterality Date  . 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 ABDOMINAL EXPLORATION SURGERY  02/2004   w/LOA/notes 12/10/2010; small bowel obstruction repair with adhesiolysis   . BACK SURGERY    . CARDIAC CATHETERIZATION     2 heart caths in the past.  One in 2000s showed one ulcerated plaque  Rx medically; Second at [facility] [name] 09/05/2016  . CATARACT EXTRACTION W/ INTRAOCULAR LENS  IMPLANT, BILATERAL Bilateral 2000s  . COLECTOMY     hx of remote ileum resection due to bleeding  . CORONARY ANGIOPLASTY WITH STENT PLACEMENT  09/10/2016  . CORONARY STENT INTERVENTION N/A 09/10/2016   Procedure: Coronary Stent Intervention;  Surgeon: Kathleene Hazel, MD;  Location: [facility] INVASIVE CV LAB;  Service: Cardiovascular;  Laterality: N/A;  Distal RCA 4.0x16 [employer]nergy  . FEMORAL EMBOLOECTOMY Left 07/2000   with left leg ischemia; Dr. Hart Rochester, vascular  . GANGLION CYST EXCISION Right    "wrist"; Dr. Teressa Senter  . LUMBAR LAMINECTOMY  1972   Dr. Fannie Knee  . RIGHT/LEFT HEART CATH AND CORONARY ANGIOGRAPHY N/A 09/10/2016   Procedure: Right/Left Heart Cath and Coronary Angiography;  Surgeon: Kathleene Hazel, MD;  Location: [facility] INVASIVE CV LAB;  Service: Cardiovascular;  Laterality: N/A;  . TONSILLECTOMY       reports that he quit smoking about 18 years ago. His smoking use included Cigarettes, Pipe, and Cigars. He has a 100.00 pack-year smoking history. He has never used smokeless tobacco. He reports that he does not drink alcohol or use drugs.  No Known Allergies  Family History  Problem Relation Age of Onset  . Other Mother        gun shot    Prior to Admission medications   Medication Sig Start Date End Date Taking? Authorizing Provider  acetaminophen (TYLENOL) 325 MG tablet Take 325-650 mg by mouth daily as needed for moderate pain or headache.   Yes [provider]  ALPRAZolam Prudy Feeler) 0.5 MG tablet Take 0.5 mg by mouth at bedtime. For sleep and  anxiety   Yes [provider]  aspirin EC 81 MG tablet Take 1 tablet (81 mg total) by mouth daily. 09/17/16  Yes Bhagat, Bhavinkumar, PA  atorvastatin (LIPITOR) 40 MG tablet Take 1 tablet (40 mg total) by mouth daily. 10/21/16  Yes Pricilla Riffle, MD  bisoprolol (ZEBETA) 5 MG tablet Take 1 tablet (5 mg total) by mouth daily. 09/11/16  Yes Strader, [place] M, PA-C  budesonide-formoterol (SYMBICORT) 160-4.5 MCG/ACT inhaler Inhale 2 puffs into the lungs 2 (two) times daily. 12/11/16  Yes Parrett, Tammy S, NP  Cholecalciferol (VITAMIN D3) 2000 units capsule Take 2,000 Units by mouth daily.  01/11/16  Yes [provider]  clopidogrel (PLAVIX) 75 MG tablet Take 1 tablet (75 mg total) by mouth daily with breakfast. 09/11/16  Yes Strader, [place] M, PA-C  fluticasone (FLONASE) 50 MCG/ACT nasal spray USE 2 SPRAYS IN EACH NOSTRIL DAILY Patient taking differently: USE 2 SPRAYS IN EACH NOSTRIL DAILY AS NEEDED FOR CONGESTION 05/23/16  Yes Kalman Shan, MD  gabapentin (NEURONTIN) 800 MG tablet Take 800 mg by mouth 2 (two) times daily.     Yes [provider]  insulin aspart (NOVOLOG) 100 UNIT/ML injection Inject 6-8 Units into the skin daily  with supper. Sliding scale 01/11/16  Yes [provider]  leflunomide (ARAVA) 20 MG tablet Take 20 mg by mouth daily.    Yes [provider]  levothyroxine (SYNTHROID, LEVOTHROID) 200 MCG tablet Take 200 mcg by mouth daily before breakfast.   Yes [provider]  Liraglutide (VICTOZA) 18 MG/3ML SOPN Inject 1.2 mg into the skin at bedtime.    Yes [provider]  losartan (COZAAR) 50 MG tablet Take 1 tablet (50 mg total) by mouth daily. 08/24/13  Yes Zannie Cove, MD  metFORMIN (GLUCOPHAGE) 1000 MG tablet Take 1,000 mg by mouth 2 (two) times daily with a meal.   Yes [provider]  Multiple Vitamins-Minerals (MEGA MULTIVITAMIN FOR MEN PO) Take 1 tablet by mouth daily.    Yes [provider]    OXYGEN Inhale 2 L into the lungs continuous.   Yes [provider]  sertraline (ZOLOFT) 100 MG tablet Take 50 mg by mouth daily.    Yes [provider]  Tamsulosin HCl (FLOMAX) 0.4 MG CAPS Take 0.4 mg by mouth at bedtime.    Yes [provider]  tiotropium (SPIRIVA HANDIHALER) 18 MCG inhalation capsule Place 18 mcg into inhaler and inhale daily.     Yes [provider]  albuterol (PROAIR HFA) 108 (90 BASE) MCG/ACT inhaler Inhale 2 puffs into the lungs every 4 (four) hours as needed. Shortness of breath 10/09/11   Kalman Shan, MD  albuterol (PROVENTIL) (2.5 MG/3ML) 0.083% nebulizer solution 1 vial in nebulizer every 6 hours as needed Dx 496 05/31/12   Kalman Shan, MD  budesonide-formoterol (SYMBICORT) 160-4.5 MCG/ACT inhaler Inhale 2 puffs into the lungs 2 (two) times daily. Patient not taking: Reported on 12/25/2016 12/11/16   Parrett, Virgel Bouquet, NP  Carboxymethylcellul-Glycerin (LUBRICATING EYE DROPS OP) Apply 1 drop to eye daily as needed (dry eyes).    [provider]  doxycycline (VIBRA-TABS) 100 MG tablet Take 1 tablet (100 mg total) by mouth 2 (two) times daily. Patient not taking: Reported on 12/25/2016 12/11/16   Parrett, Virgel Bouquet, NP  furosemide (LASIX) 40 MG tablet TK 1 T PO D PRN FOR FLUID OR EDEMA 09/11/16   [provider]  insulin glargine (LANTUS) 100 UNIT/ML injection Inject 0.7 mLs (70 Units total) into the skin at bedtime. Patient not taking: Reported on 12/25/2016 10/03/16   Corwin Levins, MD  predniSONE (DELTASONE) 10 MG tablet 4 tabs for 2 days, then 3 tabs for 2 days, 2 tabs for 2 days, then 1 tab for 2 days, then stop Patient not taking: Reported on 12/25/2016 12/11/16   Parrett, Virgel Bouquet, NP  predniSONE (DELTASONE) 20 MG tablet Take 1 tablet (20 mg total) by mouth daily with breakfast. Patient not taking: Reported on 12/25/2016 10/28/16   de Dios, Kearney A, MD  spironolactone (ALDACTONE) 25 MG tablet Take 1 tablet (25 mg  total) by mouth daily. 09/18/16 12/17/16  Manson Passey, PA    Physical Exam: Vitals:   12/25/16 1531 12/25/16 1630 12/25/16 1700 12/25/16 1730  BP: (!) 147/68 (!) 155/77 (!) 144/77 (!) 158/73  Pulse: 82 78 80 79  Resp: 17 (!) 24 20 19   Temp:      TempSrc:      SpO2: 95% 93% 91% 94%  Weight: 100.7 kg (222 lb)     Height: 6\' 3"  (1.905 m)       Constitutional: NAD, calm, comfortable, sitting completely upright at the edge of bed with pursed lip breathing but  able to complete complete sentences and carry on conversation Eyes: PERRL, lids and conjunctivae normal ENMT: Mucous membranes are moist. Posterior pharynx clear of any exudate or lesions.Normal dentition.  Neck: normal, supple, no masses, no thyromegaly Respiratory:  Pursed lip breathing, no obvious accessory muscle use, diminished throughout with a slight wheeze at the left mid back, no focal rales or rhonchi Cardiovascular: Distant heart sounds heard at the level of the xiphoid process, Regular rate and rhythm, no murmurs / rubs / gallops.  2+ radial pulse. 1+ pitting bilateral lower extremity edema Abdomen: no tenderness, no masses palpated. No hepatosplenomegaly. Bowel sounds positive.  Musculoskeletal: no clubbing / cyanosis. No joint deformity upper and lower extremities. Good ROM, no contractures. Normal muscle tone.  Skin: no rashes, lesions, ulcers. No induration Neurologic: CN 2-12 grossly intact. Sensation intact. Strength 5/5 in all 4.  Psychiatric: Normal judgment and insight. Alert and oriented x 3. Normal mood.   Labs on Admission: I have personally reviewed following labs and imaging studies  CBC:  Recent Labs Lab 12/23/16 1053 12/25/16 1455  WBC 8.8 9.4  NEUTROABS 6.0 6.7  HGB  --  10.4*  HCT 34.6* 32.7*  MCV 84 85.4  PLT 125* 118*   Basic Metabolic Panel:  Recent Labs Lab 12/23/16 1053 12/25/16 1455  NA 139 135  K 4.9 4.2  CL 100 100*  CO2 25 25  GLUCOSE 160* 248*  BUN 16 20  CREATININE  0.95 1.03  CALCIUM 8.8 8.8*   GFR: Estimated Creatinine Clearance: 74.1 mL/min (by C-G formula based on SCr of 1.03 mg/dL). Liver Function Tests:  Recent Labs Lab 12/23/16 1053  AST 20  ALT 17  ALKPHOS 53  BILITOT 0.8  PROT 6.2  ALBUMIN 4.0   No results for input(s): LIPASE, AMYLASE in the last 168 hours. No results for input(s): AMMONIA in the last 168 hours. Coagulation Profile: No results for input(s): INR, PROTIME in the last 168 hours. Cardiac Enzymes: No results for input(s): CKTOTAL, CKMB, CKMBINDEX, TROPONINI in the last 168 hours. BNP (last 3 results)  Recent Labs  10/20/16 1038  PROBNP 188   HbA1C: No results for input(s): HGBA1C in the last 72 hours. CBG: No results for input(s): GLUCAP in the last 168 hours. Lipid Profile:  Recent Labs  12/23/16 1053  CHOL 123  HDL 37*  LDLCALC 65  TRIG 176  CHOLHDL 3.3   Thyroid Function Tests: No results for input(s): TSH, T4TOTAL, FREET4, T3FREE, THYROIDAB in the last 72 hours. Anemia Panel: No results for input(s): VITAMINB12, FOLATE, FERRITIN, TIBC, IRON, RETICCTPCT in the last 72 hours. Urine analysis:    Component Value Date/Time   COLORURINE YELLOW 12/25/2016 1417   APPEARANCEUR CLEAR 12/25/2016 1417   LABSPEC 1.019 12/25/2016 1417   PHURINE 5.0 12/25/2016 1417   GLUCOSEU 50 (A) 12/25/2016 1417   GLUCOSEU 100 (A) 10/02/2016 1045   HGBUR NEGATIVE 12/25/2016 1417   BILIRUBINUR NEGATIVE 12/25/2016 1417   BILIRUBINUR negative 07/06/2014 1704   KETONESUR NEGATIVE 12/25/2016 1417   PROTEINUR 30 (A) 12/25/2016 1417   UROBILINOGEN 0.2 10/02/2016 1045   NITRITE NEGATIVE 12/25/2016 1417   LEUKOCYTESUR NEGATIVE 12/25/2016 1417   Sepsis Labs: @LABRCNTIP (procalcitonin:4,lacticidven:4) )No results found for this or any previous visit (from the past 240 hour(s)).   Radiological Exams on Admission: Dg Chest 2 View  Result Date: 12/25/2016 CLINICAL DATA:  Low-grade fever and chills. Ex-smoker. COPD.  Previous coronary stent placement. EXAM: CHEST  2 VIEW COMPARISON:  10/28/2016. FINDINGS: Normal sized heart.  Bilateral pleural thickening without significant change. Interval left upper lobe airspace opacity. Stable prominence of the interstitial markings bilaterally and linear scarring on the right. The hemidiaphragms remain flattened. Unremarkable bones. IMPRESSION: 1. Left upper lobe pneumonia. 2. 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 Electronically Signed   By: Beckie Salts M.D.   On: 12/25/2016 15:26    EKG: Independently reviewed. Sinus rhythm, left bundle branch block stable from prior. Telemetry demonstrates frequent PVCs.    Assessment/Plan Active Problems:   Community acquired pneumonia   Sepsis (HCC)    Community-acquired pneumonia, left upper lobe, failed outpatient antibiotics with worsening fevers and cough.  Patient does not meet sepsis criteria at this time.   -  Blood cultures pending -  Continue ceftriaxone and azithromycin -  Sputum culture if able -  Strep pneumo urine antigen -  Will hold on steroids at this time as patient and add if he clinically deteriorates -  Continuous pulse ox -  Ambulatory pulse ox in AM  Chronic respiratory failure due to severe COPD -  Continue Symbicort, Spiriva, 2 L nasal cannula  Obstructive sleep apnea, stable, continue nightly CPAP  Coronary artery disease with hypertension and hyperlipidemia, stent placed in February of this year, chest pain-free.  Blood pressure mildly elevated in the emergency department. -  Continue aspirin, Plavix, atorvastatin, bisoprolol, losartan -  Telemetry overnight due to frequent PVC  Chronic systolic heart failure, Ischemic cardiomyopathy with ejection fraction of 35-40 percent, minimal bilateral lower extremity edema -  Daily weights, strict ins and outs -  Will not schedule Lasix at this time but also not give IV fluids -  Continue spironolactone,  ARB, and BB  Diabetes mellitus type 2, A1c 9.5 on 10/02/2016, uncontrolled and hyperglycemic -  Lantus 55 units nightly -  Moderate dose sliding scale insulin -  Hold metformin and victoza  Rheumatoid arthritis, stable - Patient receiving weekly Enbrel injections -  Hold leflunomide pending improvement in pneumonia -  Continue gabapentin  Depression/anxiety, stable, continue sertraline and xanax for sleep as needed  BPH with difficulty urinating -  Continue flomax -  I/O and consider foley placement if urinary retention occurs with close follow up with urology as outpatient  Hypothyroidism, stable, continue Synthroid  DVT prophylaxis: lovenox  Code Status: partial code.  Would never want to be intubated but would want bipap, cpr, medications, and cardioversion.   Family Communication: patient.  Wife and daughter present at bedside, questions answered  Disposition Plan:  Likely home in 1-2 days on oral antibiotics  Consults called: none  Admission status: observation because patient is on his baseline 2L O2, however, he is at risk of clinical decompensation due to his immunocompromised state from medications from his RA, his underlying CAD and systolic heart failure with EF of 35%, his severe COPD and uncontrolled diabetes.    Renae Fickle MD [facility] Pager [phone]  If 7PM-7AM, please contact night-coverage [email or web address] Password [facility]  12/25/2016, 6:17 PM

## 2016-12-25 NOTE — Telephone Encounter (Signed)
I called and left message on voicemail to call office about scheduling for Cardiac Rehab program. I left my contact information on patient voicemail. °

## 2016-12-25 NOTE — ED Triage Notes (Addendum)
Pt from UC with complaints of mild urinary retention. Pt states he is able to urinate, but feels pressure when he does so. Pt reports this has been ongoing x 2 days. Pt has hx of BPH. Pt presents with paperwork from UC stating they suspect he is septic due to a urinary infection. Per pt they did no tests. Per paperwork pt was febrile at [facility], pt was not medicated for this and is not febrile currently. Pt is not tachycardic. Pt denies bladder pain. Pt does report 7/10 generalized aches that he states are his baseline.  Bladder scan revealed 187 ml. Pt not in distress

## 2016-12-26 DIAGNOSIS — E785 Hyperlipidemia, unspecified: Secondary | ICD-10-CM | POA: Diagnosis present

## 2016-12-26 DIAGNOSIS — J189 Pneumonia, unspecified organism: Secondary | ICD-10-CM | POA: Diagnosis present

## 2016-12-26 DIAGNOSIS — J449 Chronic obstructive pulmonary disease, unspecified: Secondary | ICD-10-CM

## 2016-12-26 DIAGNOSIS — I110 Hypertensive heart disease with heart failure: Secondary | ICD-10-CM | POA: Diagnosis present

## 2016-12-26 DIAGNOSIS — I255 Ischemic cardiomyopathy: Secondary | ICD-10-CM | POA: Diagnosis present

## 2016-12-26 DIAGNOSIS — Z7951 Long term (current) use of inhaled steroids: Secondary | ICD-10-CM | POA: Diagnosis not present

## 2016-12-26 DIAGNOSIS — R3911 Hesitancy of micturition: Secondary | ICD-10-CM | POA: Diagnosis present

## 2016-12-26 DIAGNOSIS — J9611 Chronic respiratory failure with hypoxia: Secondary | ICD-10-CM | POA: Diagnosis not present

## 2016-12-26 DIAGNOSIS — E1165 Type 2 diabetes mellitus with hyperglycemia: Secondary | ICD-10-CM | POA: Diagnosis present

## 2016-12-26 DIAGNOSIS — J9610 Chronic respiratory failure, unspecified whether with hypoxia or hypercapnia: Secondary | ICD-10-CM | POA: Diagnosis present

## 2016-12-26 DIAGNOSIS — D899 Disorder involving the immune mechanism, unspecified: Secondary | ICD-10-CM | POA: Diagnosis not present

## 2016-12-26 DIAGNOSIS — Z794 Long term (current) use of insulin: Secondary | ICD-10-CM | POA: Diagnosis not present

## 2016-12-26 DIAGNOSIS — Z9989 Dependence on other enabling machines and devices: Secondary | ICD-10-CM | POA: Diagnosis not present

## 2016-12-26 DIAGNOSIS — N401 Enlarged prostate with lower urinary tract symptoms: Secondary | ICD-10-CM | POA: Diagnosis present

## 2016-12-26 DIAGNOSIS — R197 Diarrhea, unspecified: Secondary | ICD-10-CM | POA: Diagnosis present

## 2016-12-26 DIAGNOSIS — I2510 Atherosclerotic heart disease of native coronary artery without angina pectoris: Secondary | ICD-10-CM | POA: Diagnosis present

## 2016-12-26 DIAGNOSIS — G4733 Obstructive sleep apnea (adult) (pediatric): Secondary | ICD-10-CM | POA: Diagnosis present

## 2016-12-26 DIAGNOSIS — I5022 Chronic systolic (congestive) heart failure: Secondary | ICD-10-CM | POA: Diagnosis present

## 2016-12-26 DIAGNOSIS — F419 Anxiety disorder, unspecified: Secondary | ICD-10-CM | POA: Diagnosis present

## 2016-12-26 DIAGNOSIS — J181 Lobar pneumonia, unspecified organism: Secondary | ICD-10-CM | POA: Diagnosis not present

## 2016-12-26 DIAGNOSIS — A419 Sepsis, unspecified organism: Secondary | ICD-10-CM

## 2016-12-26 DIAGNOSIS — J440 Chronic obstructive pulmonary disease with acute lower respiratory infection: Secondary | ICD-10-CM | POA: Diagnosis present

## 2016-12-26 DIAGNOSIS — E039 Hypothyroidism, unspecified: Secondary | ICD-10-CM | POA: Diagnosis present

## 2016-12-26 DIAGNOSIS — H9192 Unspecified hearing loss, left ear: Secondary | ICD-10-CM | POA: Diagnosis present

## 2016-12-26 DIAGNOSIS — I447 Left bundle-branch block, unspecified: Secondary | ICD-10-CM | POA: Diagnosis present

## 2016-12-26 DIAGNOSIS — R338 Other retention of urine: Secondary | ICD-10-CM | POA: Diagnosis present

## 2016-12-26 DIAGNOSIS — M069 Rheumatoid arthritis, unspecified: Secondary | ICD-10-CM | POA: Diagnosis present

## 2016-12-26 DIAGNOSIS — Z7984 Long term (current) use of oral hypoglycemic drugs: Secondary | ICD-10-CM | POA: Diagnosis not present

## 2016-12-26 DIAGNOSIS — Z9981 Dependence on supplemental oxygen: Secondary | ICD-10-CM | POA: Diagnosis not present

## 2016-12-26 DIAGNOSIS — Z7982 Long term (current) use of aspirin: Secondary | ICD-10-CM | POA: Diagnosis not present

## 2016-12-26 LAB — BASIC METABOLIC PANEL
Anion gap: 9 (ref 5–15)
BUN: 15 mg/dL (ref 6–20)
CHLORIDE: 105 mmol/L (ref 101–111)
CO2: 27 mmol/L (ref 22–32)
Calcium: 8.7 mg/dL — ABNORMAL LOW (ref 8.9–10.3)
Creatinine, Ser: 0.83 mg/dL (ref 0.61–1.24)
GFR calc Af Amer: >60 mL/min (ref >60)
GLUCOSE: 121 mg/dL — AB (ref 65–99)
POTASSIUM: 3.9 mmol/L (ref 3.5–5.1)
Sodium: 141 mmol/L (ref 135–145)

## 2016-12-26 LAB — CBC
HEMATOCRIT: 30.9 % — AB (ref 39.0–52.0)
Hemoglobin: 10.1 g/dL — ABNORMAL LOW (ref 13.0–17.0)
MCH: 27.7 pg (ref 26.0–34.0)
MCHC: 32.7 g/dL (ref 30.0–36.0)
MCV: 84.9 fL (ref 78.0–100.0)
Platelets: 109 10*3/uL — ABNORMAL LOW (ref 150–400)
RBC: 3.64 MIL/uL — ABNORMAL LOW (ref 4.22–5.81)
RDW: 14.8 % (ref 11.5–15.5)
WBC: 8.5 10*3/uL (ref 4.0–10.5)

## 2016-12-26 LAB — RESPIRATORY PANEL BY PCR
Adenovirus: NOT DETECTED
BORDETELLA PERTUSSIS-RVPCR: NOT DETECTED
CHLAMYDOPHILA PNEUMONIAE-RVPPCR: NOT DETECTED
CORONAVIRUS 229E-RVPPCR: NOT DETECTED
CORONAVIRUS HKU1-RVPPCR: NOT DETECTED
Coronavirus NL63: NOT DETECTED
Coronavirus OC43: NOT DETECTED
INFLUENZA B-RVPPCR: NOT DETECTED
Influenza A: NOT DETECTED
METAPNEUMOVIRUS-RVPPCR: NOT DETECTED
Mycoplasma pneumoniae: NOT DETECTED
Parainfluenza Virus 1: NOT DETECTED
Parainfluenza Virus 2: NOT DETECTED
Parainfluenza Virus 3: NOT DETECTED
Parainfluenza Virus 4: NOT DETECTED
RESPIRATORY SYNCYTIAL VIRUS-RVPPCR: NOT DETECTED
Rhinovirus / Enterovirus: NOT DETECTED

## 2016-12-26 LAB — GLUCOSE, CAPILLARY
GLUCOSE-CAPILLARY: 112 mg/dL — AB (ref 65–99)
Glucose-Capillary: 125 mg/dL — ABNORMAL HIGH (ref 65–99)
Glucose-Capillary: 304 mg/dL — ABNORMAL HIGH (ref 65–99)

## 2016-12-26 LAB — HIV ANTIBODY (ROUTINE TESTING W REFLEX): HIV SCREEN 4TH GENERATION: NONREACTIVE

## 2016-12-26 LAB — URINE CULTURE: CULTURE: NO GROWTH

## 2016-12-26 LAB — EXPECTORATED SPUTUM ASSESSMENT W REFEX TO RESP CULTURE

## 2016-12-26 LAB — EXPECTORATED SPUTUM ASSESSMENT W GRAM STAIN, RFLX TO RESP C

## 2016-12-26 LAB — STREP PNEUMONIAE URINARY ANTIGEN: STREP PNEUMO URINARY ANTIGEN: NEGATIVE

## 2016-12-26 MED ORDER — ACETAMINOPHEN 325 MG PO TABS
650.0000 mg | ORAL_TABLET | Freq: Four times a day (QID) | ORAL | Status: DC | PRN
  Administered 2016-12-26 – 2016-12-27 (×3): 650 mg via ORAL
  Filled 2016-12-26 (×2): qty 2

## 2016-12-26 MED ORDER — TAMSULOSIN HCL 0.4 MG PO CAPS
0.8000 mg | ORAL_CAPSULE | Freq: Every day | ORAL | Status: DC
  Administered 2016-12-26 – 2016-12-27 (×2): 0.8 mg via ORAL
  Filled 2016-12-26 (×2): qty 2

## 2016-12-26 NOTE — Progress Notes (Signed)
Pt. found on CPAP with oxygen on & inline, tolerating well, RT to monitor.

## 2016-12-26 NOTE — Progress Notes (Signed)
PROGRESS NOTE  ORRIE SCHUBERT EXN:[id number] DOB: [birth year] DOA: 12/25/2016 PCP: Corwin Levins, MD  HPI/Recap of past 24 hours:  Feeling achy allover C/o urinary hesitancy Report less cough, denies chest pain No lower extremity edema  Assessment/Plan: Principal Problem:   Community acquired pneumonia Active Problems:   Hypothyroidism   OSA on CPAP   COPD, severe (HCC)  Community-acquired pneumonia, left upper lobe, failed outpatient antibiotics with worsening fevers and cough.  Patient does not meet sepsis criteria at this time.   -  Blood cultures pending -  Continue ceftriaxone and azithromycin -  Sputum culture if able -  Strep pneumo urine antigen -   Chronic respiratory failure due to severe COPD -  Continue Symbicort, Spiriva, 2 L nasal cannula  Obstructive sleep apnea, stable, continue nightly CPAP  Coronary artery disease with hypertension and hyperlipidemia, stent placed in February of this year, chest pain-free.  Blood pressure mildly elevated in the emergency department. -  Continue aspirin, Plavix, atorvastatin, bisoprolol, losartan -  Telemetry overnight due to frequent PVC  Chronic systolic heart failure, Ischemic cardiomyopathy with ejection fraction of 35-40 percent, minimal bilateral lower extremity edema -  Daily weights, strict ins and outs -  Will not schedule Lasix at this time but also not give IV fluids -  Continue spironolactone, ARB, and BB  Diabetes mellitus type 2, A1c 9.5 on 10/02/2016, uncontrolled and hyperglycemic -  Lantus 55 units nightly -  Moderate dose sliding scale insulin -  Hold metformin and victoza  Rheumatoid arthritis, stable - Patient receiving weekly Enbrel injections -  Hold leflunomide pending improvement in pneumonia -  Continue gabapentin  Depression/anxiety, stable, continue sertraline and xanax for sleep as needed  BPH with difficulty urinating -  increase flomax -  bladder scan ordered, close follow  up with urology as outpatient  Hypothyroidism, stable, continue Synthroid  DVT prophylaxis: lovenox  Code Status: partial code.  Would never want to be intubated but would want bipap, cpr, medications, and cardioversion.   Family Communication: patient.  Disposition Plan:  Likely home in 1-2 days on oral antibiotics , may need home health Consults called: none    Consultants:  none  Procedures:  none  Antibiotics:  Rocephin/zithro   Objective: BP 127/74 (BP Location: Left Arm)   Pulse 74   Temp 98.5 F (36.9 C) (Oral)   Resp 20   Ht 6\' 3"  (1.905 m)   Wt 100.7 kg (222 lb)   SpO2 92%   BMI 27.75 kg/m   Intake/Output Summary (Last 24 hours) at 12/26/16 1105 Last data filed at 12/26/16 0818  Gross per 24 hour  Intake           793.75 ml  Output             2400 ml  Net         -1606.25 ml   Filed Weights   12/25/16 1328 12/25/16 1531  Weight: 100.7 kg (222 lb) 100.7 kg (222 lb)    Exam:   General:  Frail, chronically ill appearance   Cardiovascular: RRR  Respiratory: very diminished overall, but no wheezing  Abdomen: Soft/ND/NT, positive BS  Musculoskeletal: No Edema  Neuro: aaox3  Data Reviewed: Basic Metabolic Panel:  Recent Labs Lab 12/23/16 1053 12/25/16 1455 12/26/16 0559  NA 139 135 141  K 4.9 4.2 3.9  CL 100 100* 105  CO2 25 25 27   GLUCOSE 160* 248* 121*  BUN 16 20 15   CREATININE 0.95  1.03 0.83  CALCIUM 8.8 8.8* 8.7*   Liver Function Tests:  Recent Labs Lab 12/23/16 1053  AST 20  ALT 17  ALKPHOS 53  BILITOT 0.8  PROT 6.2  ALBUMIN 4.0   No results for input(s): LIPASE, AMYLASE in the last 168 hours. No results for input(s): AMMONIA in the last 168 hours. CBC:  Recent Labs Lab 12/23/16 1053 12/25/16 1455 12/26/16 0559  WBC 8.8 9.4 8.5  NEUTROABS 6.0 6.7  --   HGB  --  10.4* 10.1*  HCT 34.6* 32.7* 30.9*  MCV 84 85.4 84.9  PLT 125* 118* 109*   Cardiac Enzymes:   No results for input(s): CKTOTAL, CKMB,  CKMBINDEX, TROPONINI in the last 168 hours. BNP (last 3 results) No results for input(s): BNP in the last 8760 hours.  ProBNP (last 3 results)  Recent Labs  10/20/16 1038  PROBNP 188    CBG:  Recent Labs Lab 12/25/16 2202 12/26/16 0743  GLUCAP 160* 125*    No results found for this or any previous visit (from the past 240 hour(s)).   Studies: Dg Chest 2 View  Result Date: 12/25/2016 CLINICAL DATA:  Low-grade fever and chills. Ex-smoker. COPD. Previous coronary stent placement. EXAM: CHEST  2 VIEW COMPARISON:  10/28/2016. FINDINGS: Normal sized heart. Bilateral pleural thickening without significant change. Interval left upper lobe airspace opacity. Stable prominence of the interstitial markings bilaterally and linear scarring on the right. The hemidiaphragms remain flattened. Unremarkable bones. IMPRESSION: 1. Left upper lobe pneumonia. 2. 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 Electronically Signed   By: Beckie Salts M.D.   On: 12/25/2016 15:26    Scheduled Meds: . ALPRAZolam  0.5 mg Oral QHS  . aspirin EC  81 mg Oral Daily  . atorvastatin  40 mg Oral Daily  . azithromycin  500 mg Oral Q24H  . bisoprolol  5 mg Oral Daily  . clopidogrel  75 mg Oral Q breakfast  . enoxaparin (LOVENOX) injection  40 mg Subcutaneous Q24H  . gabapentin  800 mg Oral BID  . insulin aspart  0-15 Units Subcutaneous TID WC  . insulin aspart  0-5 Units Subcutaneous QHS  . insulin glargine  55 Units Subcutaneous QHS  . levothyroxine  200 mcg Oral QAC breakfast  . losartan  50 mg Oral Daily  . mometasone-formoterol  2 puff Inhalation BID  . sertraline  50 mg Oral Daily  . spironolactone  25 mg Oral Daily  . tamsulosin  0.8 mg Oral QHS  . tiotropium  18 mcg Inhalation Daily    Continuous Infusions: . cefTRIAXone (ROCEPHIN)  IV Stopped (12/25/16 1618)     Time spent:   Meghen Akopyan MD, PhD  [facility] Pager [phone]. If 7PM-7AM,  please contact night-coverage at [email or web address], password [facility] 12/26/2016, 11:05 AM  LOS: 0 days

## 2016-12-26 NOTE — Care Management Note (Signed)
Case Management Note  Patient Details  Name: SAHAS SLUKA MRN: [id number] Date of Birth: 1940-09-02  Subjective/Objective:                  76 y.o. male with history of severe COPD, ischemic cardiomyopathy with ejection fraction of 35-40%, coronary artery disease with stent placement to the RCA in February 2018, rheumatoid arthritis, diabetes mellitus type 2, and hypothyroidism who presents with chills, fevers, muscle aches, difficulty voiding.  The patient was seen by pulmonology a proximally 2 weeks ago at which time he reported that he was having increased yellow phlegm. He was started on azithromycin and prednisone taper which did not affect his sputum production. Despite taking these medications he developed worsening chills and fatigue and over the last week since he has stopped these medications he has developed low-grade fevers and worsening muscle aches. He denies significant increase in shortness of breath and remains on his usual 2 L home oxygen and has been able to tolerate his CPAP machine at night. He presented to urgent care today because he has had a noticeable decrease in his urinary stream with difficulty getting his stream started and he felt that he was retaining urine. At urgent care, he had low-grade fever and he was referred to the emergency department due to concerns for sepsis due to UTI. He denies dysuria but has had increased frequency and urgency. He denies upper respiratory symptoms, nausea, vomiting, diarrhea.  Action/Plan: Date:  December 26, 2016  Chart reviewed for concurrent status and case management needs.  Will continue to follow patient progress.  Discharge Planning: following for needs  Expected discharge date: [id number]  Marcelle Smiling, BSN, [place], [place]   [phone]   Expected Discharge Date:   (unknown)               Expected Discharge Plan:  Home/Self Care  In-House Referral:     Discharge planning Services  CM Consult  Post Acute Care Choice:    Choice  offered to:     DME Arranged:    DME Agency:     HH Arranged:    HH Agency:     Status of Service:  In process, will continue to follow  If discussed at Long Length of Stay Meetings, dates discussed:    Additional Comments:  Golda Acre, RN 12/26/2016, 9:44 AM

## 2016-12-27 DIAGNOSIS — J9611 Chronic respiratory failure with hypoxia: Secondary | ICD-10-CM

## 2016-12-27 DIAGNOSIS — D899 Disorder involving the immune mechanism, unspecified: Secondary | ICD-10-CM

## 2016-12-27 LAB — BASIC METABOLIC PANEL
ANION GAP: 8 (ref 5–15)
BUN: 19 mg/dL (ref 6–20)
CHLORIDE: 105 mmol/L (ref 101–111)
CO2: 25 mmol/L (ref 22–32)
Calcium: 8.4 mg/dL — ABNORMAL LOW (ref 8.9–10.3)
Creatinine, Ser: 1.07 mg/dL (ref 0.61–1.24)
Glucose, Bld: 321 mg/dL — ABNORMAL HIGH (ref 65–99)
POTASSIUM: 4.3 mmol/L (ref 3.5–5.1)
SODIUM: 138 mmol/L (ref 135–145)

## 2016-12-27 LAB — MRSA PCR SCREENING: MRSA by PCR: NEGATIVE

## 2016-12-27 LAB — CBC
HCT: 32.8 % — ABNORMAL LOW (ref 39.0–52.0)
HEMOGLOBIN: 10.5 g/dL — AB (ref 13.0–17.0)
MCH: 27 pg (ref 26.0–34.0)
MCHC: 32 g/dL (ref 30.0–36.0)
MCV: 84.3 fL (ref 78.0–100.0)
Platelets: 136 10*3/uL — ABNORMAL LOW (ref 150–400)
RBC: 3.89 MIL/uL — AB (ref 4.22–5.81)
RDW: 14.7 % (ref 11.5–15.5)
WBC: 8.8 10*3/uL (ref 4.0–10.5)

## 2016-12-27 LAB — GLUCOSE, CAPILLARY
GLUCOSE-CAPILLARY: 203 mg/dL — AB (ref 65–99)
GLUCOSE-CAPILLARY: 292 mg/dL — AB (ref 65–99)
Glucose-Capillary: 179 mg/dL — ABNORMAL HIGH (ref 65–99)
Glucose-Capillary: 151 mg/dL — ABNORMAL HIGH (ref 65–99)

## 2016-12-27 LAB — MAGNESIUM: MAGNESIUM: 1.5 mg/dL — AB (ref 1.7–2.4)

## 2016-12-27 LAB — C DIFFICILE QUICK SCREEN W PCR REFLEX
C DIFFICILE (CDIFF) INTERP: NOT DETECTED
C DIFFICILE (CDIFF) TOXIN: NEGATIVE
C Diff antigen: NEGATIVE

## 2016-12-27 MED ORDER — MAGNESIUM SULFATE 2 GM/50ML IV SOLN
2.0000 g | Freq: Once | INTRAVENOUS | Status: AC
  Administered 2016-12-27: 2 g via INTRAVENOUS
  Filled 2016-12-27: qty 50

## 2016-12-27 MED ORDER — CLONAZEPAM 0.5 MG PO TABS
0.2500 mg | ORAL_TABLET | Freq: Two times a day (BID) | ORAL | Status: DC | PRN
  Administered 2016-12-27 – 2016-12-28 (×2): 0.25 mg via ORAL
  Filled 2016-12-27 (×2): qty 1

## 2016-12-27 NOTE — Progress Notes (Signed)
PROGRESS NOTE  [name] PZW:[id number] DOB: 10/06/1940 DOA: 12/25/2016 PCP: Corwin Levins, MD  HPI/Recap of past 24 hours:  Feeling less achy , now able to cough up, denies chest pain C/o urinary hesitancy No lower extremity edema Report some diarrhea started when he was in the ED  Assessment/Plan: Principal Problem:   Community acquired pneumonia Active Problems:   Hypothyroidism   OSA on CPAP   COPD, severe (HCC)   CAP (community acquired pneumonia)  Community-acquired pneumonia in an immunosuppressed individual t max 100.3, lactic acid 2.65 on admission, left upper lobe, failed outpatient antibiotics with worsening fevers and cough.  -  Blood culture no growth, sputum culture in process, mrsa screening negative, respiratory viral panel negative, Strep pneumo urine antigen negative -  Continue ceftriaxone and azithromycin  Hypomagnesemia: iv mag, repeat in am  Diarrhea: negative c diff  Chronic respiratory failure due to severe COPD -  Continue Symbicort, Spiriva, 2 L nasal cannula  Obstructive sleep apnea, stable, continue nightly CPAP  Coronary artery disease with hypertension and hyperlipidemia, stent placed in February of this year, chest pain-free.  Blood pressure mildly elevated in the emergency department. -  Continue aspirin, Plavix, atorvastatin, bisoprolol, losartan -  Telemetry overnight due to frequent PVC  Chronic systolic heart failure, Ischemic cardiomyopathy with ejection fraction of 35-40 percent, minimal bilateral lower extremity edema -  Daily weights, strict ins and outs -  Will not schedule Lasix at this time but also not give IV fluids -  Continue spironolactone, ARB, and BB  Insulin dependent Diabetes mellitus type 2, A1c 9.5 on 10/02/2016, uncontrolled and hyperglycemic -  Lantus 55 units nightly -  Moderate dose sliding scale insulin -  Hold metformin and victoza, resume at discharge  Rheumatoid arthritis, stable - Patient  receiving weekly Enbrel injections -  Hold leflunomide pending improvement in pneumonia -  Continue gabapentin  Depression/anxiety, stable, continue sertraline and xanax for sleep as needed  BPH with difficulty urinating -  increase flomax -  bladder scan ordered, close follow up with urology as outpatient  Hypothyroidism, stable, continue Synthroid  DVT prophylaxis: lovenox  Code Status: partial code.  Would never want to be intubated but would want bipap, cpr, medications, and cardioversion.   Family Communication: patient.  Disposition Plan:  Likely home in 1-2 days on oral antibiotics , may need home health, pending PT Consults called: none    Consultants:  none  Procedures:  none  Antibiotics:  Rocephin/zithro   Objective: BP (!) 116/49 (BP Location: Left Arm)   Pulse 67   Temp 98.2 F (36.8 C) (Oral)   Resp 20   Ht 6\' 3"  (1.905 m)   Wt 100.7 kg (222 lb)   SpO2 90%   BMI 27.75 kg/m   Intake/Output Summary (Last 24 hours) at 12/27/16 1215 Last data filed at 12/27/16 4235  Gross per 24 hour  Intake              960 ml  Output                0 ml  Net              960 ml   Filed Weights   12/25/16 1328 12/25/16 1531  Weight: 100.7 kg (222 lb) 100.7 kg (222 lb)    Exam:   General:  Frail, chronically ill appearance   Cardiovascular: RRR  Respiratory: very diminished overall, but no wheezing  Abdomen: Soft/ND/NT, positive BS  Musculoskeletal:  No Edema  Neuro: aaox3  Data Reviewed: Basic Metabolic Panel:  Recent Labs Lab 12/23/16 1053 12/25/16 1455 12/26/16 0559 12/27/16 0839  NA 139 135 141 138  K 4.9 4.2 3.9 4.3  CL 100 100* 105 105  CO2 25 25 27 25   GLUCOSE 160* 248* 121* 321*  BUN 16 20 15 19   CREATININE 0.95 1.03 0.83 1.07  CALCIUM 8.8 8.8* 8.7* 8.4*  MG  --   --   --  1.5*   Liver Function Tests:  Recent Labs Lab 12/23/16 1053  AST 20  ALT 17  ALKPHOS 53  BILITOT 0.8  PROT 6.2  ALBUMIN 4.0   No results for  input(s): LIPASE, AMYLASE in the last 168 hours. No results for input(s): AMMONIA in the last 168 hours. CBC:  Recent Labs Lab 12/23/16 1053 12/25/16 1455 12/26/16 0559 12/27/16 0839  WBC 8.8 9.4 8.5 8.8  NEUTROABS 6.0 6.7  --   --   HGB  --  10.4* 10.1* 10.5*  HCT 34.6* 32.7* 30.9* 32.8*  MCV 84 85.4 84.9 84.3  PLT 125* 118* 109* 136*   Cardiac Enzymes:   No results for input(s): CKTOTAL, CKMB, CKMBINDEX, TROPONINI in the last 168 hours. BNP (last 3 results) No results for input(s): BNP in the last 8760 hours.  ProBNP (last 3 results)  Recent Labs  10/20/16 1038  PROBNP 188    CBG:  Recent Labs Lab 12/26/16 0743 12/26/16 1201 12/26/16 1704 12/27/16 0525 12/27/16 0734  GLUCAP 125* 304* 112* 179* 203*    Recent Results (from the past 240 hour(s))  Urine culture     Status: None   Collection Time: 12/25/16  2:17 PM  Result Value Ref Range Status   Specimen Description URINE, CLEAN CATCH  Final   Special Requests NONE  Final   Culture   Final    NO GROWTH Performed at [facility], [address] [place], [address] [place]    Report Status 12/26/2016 FINAL  Final  Culture, blood (routine x 2)     Status: None (Preliminary result)   Collection Time: 12/25/16  3:26 PM  Result Value Ref Range Status   Specimen Description RIGHT ANTECUBITAL  Final   Special Requests   Final    BOTTLES DRAWN AEROBIC AND ANAEROBIC Blood Culture adequate volume   Culture   Final    NO GROWTH < 24 HOURS Performed at [facility], [address] [place], [address] [place]    Report Status PENDING  Incomplete  Culture, blood (routine x 2)     Status: None (Preliminary result)   Collection Time: 12/25/16  3:26 PM  Result Value Ref Range Status   Specimen Description BLOOD LEFT HAND  Final   Special Requests   Final    BOTTLES DRAWN AEROBIC AND ANAEROBIC Blood Culture adequate volume   Culture   Final    NO GROWTH < 24 HOURS Performed at [facility],  [address] [place], [address] [place]    Report Status PENDING  Incomplete  Culture, sputum-assessment     Status: None   Collection Time: 12/26/16  1:21 PM  Result Value Ref Range Status   Specimen Description SPU  Final   Special Requests Immunocompromised  Final   Sputum evaluation THIS SPECIMEN IS ACCEPTABLE FOR SPUTUM CULTURE  Final   Report Status 12/26/2016 FINAL  Final  Culture, respiratory (NON-Expectorated)     Status: None (Preliminary result)   Collection  Time: 12/26/16  1:21 PM  Result Value Ref Range Status   Specimen Description SPU  Final   Special Requests Immunocompromised Reflexed from [id number]  Final   Gram Stain   Final    FEW WBC PRESENT, PREDOMINANTLY PMN RARE SQUAMOUS EPITHELIAL CELLS PRESENT MODERATE GRAM VARIABLE ROD RARE GRAM POSITIVE COCCI IN PAIRS RARE YEAST    Culture   Final    NO GROWTH < 24 HOURS Performed at [facility], [address] [place], [place] [address]    Report Status PENDING  Incomplete  Respiratory Panel by PCR     Status: None   Collection Time: 12/26/16  1:35 PM  Result Value Ref Range Status   Adenovirus NOT DETECTED NOT DETECTED Final   Coronavirus 229E NOT DETECTED NOT DETECTED Final   Coronavirus HKU1 NOT DETECTED NOT DETECTED Final   Coronavirus NL63 NOT DETECTED NOT DETECTED Final   Coronavirus OC43 NOT DETECTED NOT DETECTED Final   Metapneumovirus NOT DETECTED NOT DETECTED Final   Rhinovirus / Enterovirus NOT DETECTED NOT DETECTED Final   Influenza A NOT DETECTED NOT DETECTED Final   Influenza B NOT DETECTED NOT DETECTED Final   Parainfluenza Virus 1 NOT DETECTED NOT DETECTED Final   Parainfluenza Virus 2 NOT DETECTED NOT DETECTED Final   Parainfluenza Virus 3 NOT DETECTED NOT DETECTED Final   Parainfluenza Virus 4 NOT DETECTED NOT DETECTED Final   Respiratory Syncytial Virus NOT DETECTED NOT DETECTED Final   Bordetella pertussis NOT DETECTED NOT DETECTED Final   Chlamydophila pneumoniae NOT DETECTED NOT  DETECTED Final   Mycoplasma pneumoniae NOT DETECTED NOT DETECTED Final    Comment: Performed at [facility], [address], [place], [place] [address]     Studies: No results found.  Scheduled Meds: . ALPRAZolam  0.5 mg Oral QHS  . aspirin EC  81 mg Oral Daily  . atorvastatin  40 mg Oral Daily  . azithromycin  500 mg Oral Q24H  . bisoprolol  5 mg Oral Daily  . clopidogrel  75 mg Oral Q breakfast  . enoxaparin (LOVENOX) injection  40 mg Subcutaneous Q24H  . gabapentin  800 mg Oral BID  . insulin aspart  0-15 Units Subcutaneous TID WC  . insulin aspart  0-5 Units Subcutaneous QHS  . insulin glargine  55 Units Subcutaneous QHS  . levothyroxine  200 mcg Oral QAC breakfast  . losartan  50 mg Oral Daily  . mometasone-formoterol  2 puff Inhalation BID  . sertraline  50 mg Oral Daily  . spironolactone  25 mg Oral Daily  . tamsulosin  0.8 mg Oral QHS  . tiotropium  18 mcg Inhalation Daily    Continuous Infusions: . cefTRIAXone (ROCEPHIN)  IV Stopped (12/26/16 1606)     Time spent:   Kainoa Swoboda MD, PhD  [facility] Pager [phone]. If 7PM-7AM, please contact night-coverage at [email or web address], password [facility] 12/27/2016, 12:15 PM  LOS: 1 day

## 2016-12-28 DIAGNOSIS — J181 Lobar pneumonia, unspecified organism: Secondary | ICD-10-CM

## 2016-12-28 LAB — CBC WITH DIFFERENTIAL/PLATELET
BASOS PCT: 1 %
Basophils Absolute: 0 10*3/uL (ref 0.0–0.1)
EOS ABS: 0.5 10*3/uL (ref 0.0–0.7)
Eosinophils Relative: 6 %
HCT: 30.7 % — ABNORMAL LOW (ref 39.0–52.0)
HEMOGLOBIN: 10 g/dL — AB (ref 13.0–17.0)
Lymphocytes Relative: 18 %
Lymphs Abs: 1.5 10*3/uL (ref 0.7–4.0)
MCH: 27.5 pg (ref 26.0–34.0)
MCHC: 32.6 g/dL (ref 30.0–36.0)
MCV: 84.6 fL (ref 78.0–100.0)
MONOS PCT: 14 %
Monocytes Absolute: 1.2 10*3/uL — ABNORMAL HIGH (ref 0.1–1.0)
NEUTROS PCT: 61 %
Neutro Abs: 5.3 10*3/uL (ref 1.7–7.7)
Platelets: 152 10*3/uL (ref 150–400)
RBC: 3.63 MIL/uL — ABNORMAL LOW (ref 4.22–5.81)
RDW: 14.6 % (ref 11.5–15.5)
WBC: 8.5 10*3/uL (ref 4.0–10.5)

## 2016-12-28 LAB — GLUCOSE, CAPILLARY
GLUCOSE-CAPILLARY: 244 mg/dL — AB (ref 65–99)
Glucose-Capillary: 197 mg/dL — ABNORMAL HIGH (ref 65–99)
Glucose-Capillary: 310 mg/dL — ABNORMAL HIGH (ref 65–99)

## 2016-12-28 LAB — BASIC METABOLIC PANEL
Anion gap: 8 (ref 5–15)
BUN: 21 mg/dL — ABNORMAL HIGH (ref 6–20)
CALCIUM: 8.1 mg/dL — AB (ref 8.9–10.3)
CHLORIDE: 107 mmol/L (ref 101–111)
CO2: 24 mmol/L (ref 22–32)
CREATININE: 1.01 mg/dL (ref 0.61–1.24)
GFR calc non Af Amer: >60 mL/min (ref >60)
Glucose, Bld: 211 mg/dL — ABNORMAL HIGH (ref 65–99)
Potassium: 4 mmol/L (ref 3.5–5.1)
SODIUM: 139 mmol/L (ref 135–145)

## 2016-12-28 LAB — MAGNESIUM: MAGNESIUM: 1.8 mg/dL (ref 1.7–2.4)

## 2016-12-28 MED ORDER — AMOXICILLIN-POT CLAVULANATE 875-125 MG PO TABS: 1.0000 | ORAL_TABLET | Freq: Two times a day (BID) | ORAL | Status: DC

## 2016-12-28 MED ORDER — TAMSULOSIN HCL 0.4 MG PO CAPS: 0.8000 mg | ORAL_CAPSULE | Freq: Every day | ORAL | 0 refills | Status: DC

## 2016-12-28 MED ORDER — AMOXICILLIN-POT CLAVULANATE 875-125 MG PO TABS: 1.0000 | ORAL_TABLET | Freq: Two times a day (BID) | ORAL | 0 refills | Status: AC

## 2016-12-28 MED ORDER — INSULIN GLARGINE 100 UNIT/ML ~~LOC~~ SOLN: 55.0000 [IU] | Freq: Every day | SUBCUTANEOUS | 0 refills | Status: DC

## 2016-12-28 NOTE — Progress Notes (Signed)
PT Cancellation Note  Patient Details Name: [name] MRN: [id number] DOB: [birth year]   Cancelled Treatment:    Reason Eval/Treat Not Completed: PT screened, no needs identified, will sign off. Spoke with pt who denied need for PT services. Pt is set to d/c home later today. Will sign off at pt's request. Thanks.    Rebeca Alert, MPT Pager: [phone]

## 2016-12-28 NOTE — Discharge Summary (Signed)
Discharge Summary  SAVOY SOMERVILLE HEN:[id number] DOB: 1940/11/11  PCP: Corwin Levins, MD  Admit date: 12/25/2016 Discharge date: 12/28/2016  Time spent: <75mins  Recommendations for Outpatient Follow-up:  1. F/u with PMD within a week  for hospital discharge follow up, repeat cbc/bmp at follow up, pmd to repeat cxr in 3-4 weeks to ensure resolution of left upper lobe pneumonia  Discharge Diagnoses:  Active Hospital Problems   Diagnosis Date Noted  . Community acquired pneumonia 07/19/2013  . CAP (community acquired pneumonia) 12/26/2016  . COPD, severe (HCC) 08/04/2014  . OSA on CPAP 06/10/2011  . Hypothyroidism 07/30/2009    Resolved Hospital Problems   Diagnosis Date Noted Date Resolved  No resolved problems to display.    Discharge Condition: stable  Diet recommendation: heart healthy/carb modified  Filed Weights   12/25/16 1328 12/25/16 1531  Weight: 100.7 kg (222 lb) 100.7 kg (222 lb)    History of present illness:  PCP: Corwin Levins, MD  Outpatient Specialists: Forest Gleason Patient coming from: home  Chief Complaint: chills, fevers, muscle aches, difficulty voiding  HPI: [name] is a 76 y.o. male with history of severe COPD, ischemic cardiomyopathy with ejection fraction of 35-40%, coronary artery disease with stent placement to the RCA in February 2018, rheumatoid arthritis, diabetes mellitus type 2, and hypothyroidism who presents with chills, fevers, muscle aches, difficulty voiding.  The patient was seen by pulmonology a proximally 2 weeks ago at which time he reported that he was having increased yellow phlegm. He was started on azithromycin and prednisone taper which did not affect his sputum production. Despite taking these medications he developed worsening chills and fatigue and over the last week since he has stopped these medications he has developed low-grade fevers and worsening muscle aches. He denies significant increase in shortness  of breath and remains on his usual 2 L home oxygen and has been able to tolerate his CPAP machine at night. He presented to urgent care today because he has had a noticeable decrease in his urinary stream with difficulty getting his stream started and he felt that he was retaining urine. At urgent care, he had low-grade fever and he was referred to the emergency department due to concerns for sepsis due to UTI. He denies dysuria but has had increased frequency and urgency. He denies upper respiratory symptoms, nausea, vomiting, diarrhea.  ED Course: Temperature 100.3 Fahrenheit, respirations 24, blood pressure stable to mildly elevated. Oxygen saturations in the high 80s to low 90s on 2 L nasal cannula. WBC 9.4.  Lactic acid 2.65.  Chest x-ray demonstrated a left upper lobe pneumonia. The patient is being admitted for community-acquired pneumonia but did not respond to outpatient antibiotics.  Hospital Course:  Principal Problem:   Community acquired pneumonia Active Problems:   Hypothyroidism   OSA on CPAP   COPD, severe (HCC)   CAP (community acquired pneumonia)   Community-acquired pneumonia in an immunosuppressed individual t max 100.3, lactic acid 2.65 on admission, left upper lobe, failed outpatient antibiotics with worsening fevers and cough.  - Blood culture no growth, sputum culture no growth, mrsa screening negative, respiratory viral panel negative, Strep pneumo urine antigen negative - he is treated with ceftriaxone and azithromycin in the hospital , he has improved, discharge on augmentin  Hypomagnesemia: replaced  Diarrhea: negative c diff, resolved.  Chronic respiratory failure due to severe COPD - Continue Symbicort, Spiriva, 2 L nasal cannula  Obstructive sleep apnea,stable, continue nightly CPAP  Coronary artery  disease with hypertension and hyperlipidemia,stent placed in February of this year, chest pain-free. Blood pressure mildly elevated in the emergency  department. - Continue aspirin, Plavix, atorvastatin, bisoprolol, losartan - stable  Chronic systolic heart failure, Ischemic cardiomyopathy with ejection fraction of 35-40 percent, minimal bilateral lower extremity edema - Daily weights, strict ins and outs - Lasix held in the hospital,  - Continue spironolactone, ARB, and BB -euvolemic , he is discharged on home meds, lasix prn  Insulin dependent Diabetes mellitus type 2, A1c 9.5 on 10/02/2016, uncontrolled and hyperglycemic - Lantus 55 units nightly - Moderate dose sliding scale insulin - metformin and victoza held in the hospital, resumed at discharge  Rheumatoid arthritis,stable - Patient receiving weekly Enbrel injections -  leflunomide held in the hospital, resumed at discharge. - Continue gabapentin  Depression/anxiety,stable, continue sertraline and xanax for sleep as needed  BPH with difficulty urinating -ua no infection - increase flomax - close follow up with urology as outpatient  Hypothyroidism, stable, continue Synthroid  DVT prophylaxis:lovenox Code Status:partial code. Would never want to be intubated but would want bipap, cpr, medications, and cardioversion.  Family Communication:patient.  Disposition Plan: home, patient declined home health Consults called:none   Consultants:  none  Procedures:  none  Antibiotics:  Rocephin/zithro   Discharge Exam: BP (!) 143/52 (BP Location: Right Leg) Comment: nurse notified  Pulse 73   Temp 98.3 F (36.8 C) (Oral)   Resp 20   Ht 6\' 3"  (1.905 m)   Wt 100.7 kg (222 lb)   SpO2 90%   BMI 27.75 kg/m    General:  Frail, chronically ill appearance   Cardiovascular: RRR  Respiratory: very diminished overall, but no wheezing  Abdomen: Soft/ND/NT, positive BS  Musculoskeletal: No Edema  Neuro: aaox3   Discharge Instructions You were cared for by a hospitalist during your hospital stay. If you have any questions  about your discharge medications or the care you received while you were in the hospital after you are discharged, you can call the unit and asked to speak with the hospitalist on call if the hospitalist that took care of you is not available. Once you are discharged, your primary care physician will handle any further medical issues. Please note that NO REFILLS for any discharge medications will be authorized once you are discharged, as it is imperative that you return to your primary care physician (or establish a relationship with a primary care physician if you do not have one) for your aftercare needs so that they can reassess your need for medications and monitor your lab values.  Discharge Instructions    Diet - low sodium heart healthy    Complete by:  As directed    Carb modified diet   Increase activity slowly    Complete by:  As directed      Allergies as of 12/28/2016   No Known Allergies     Medication List    TAKE these medications   acetaminophen 325 MG tablet Commonly known as:  TYLENOL Take 325-650 mg by mouth daily as needed for moderate pain or headache.   albuterol 108 (90 Base) MCG/ACT inhaler Commonly known as:  PROAIR HFA Inhale 2 puffs into the lungs every 4 (four) hours as needed. Shortness of breath   albuterol (2.5 MG/3ML) 0.083% nebulizer solution Commonly known as:  PROVENTIL 1 vial in nebulizer every 6 hours as needed Dx 496   ALPRAZolam 0.5 MG tablet Commonly known as:  XANAX Take 0.5 mg by mouth  at bedtime. For sleep and anxiety   amoxicillin-clavulanate 875-125 MG tablet Commonly known as:  AUGMENTIN Take 1 tablet by mouth every 12 (twelve) hours. Start taking on:  12/29/2016   aspirin EC 81 MG tablet Take 1 tablet (81 mg total) by mouth daily.   atorvastatin 40 MG tablet Commonly known as:  LIPITOR Take 1 tablet (40 mg total) by mouth daily.   bisoprolol 5 MG tablet Commonly known as:  ZEBETA Take 1 tablet (5 mg total) by mouth daily.     budesonide-formoterol 160-4.5 MCG/ACT inhaler Commonly known as:  SYMBICORT Inhale 2 puffs into the lungs 2 (two) times daily.   clopidogrel 75 MG tablet Commonly known as:  PLAVIX Take 1 tablet (75 mg total) by mouth daily with breakfast.   fluticasone 50 MCG/ACT nasal spray Commonly known as:  FLONASE USE 2 SPRAYS IN EACH NOSTRIL DAILY What changed:  See the new instructions.   furosemide 40 MG tablet Commonly known as:  LASIX TK 1 T PO D PRN FOR FLUID OR EDEMA   gabapentin 800 MG tablet Commonly known as:  NEURONTIN Take 800 mg by mouth 2 (two) times daily.   insulin aspart 100 UNIT/ML injection Commonly known as:  novoLOG Inject 6-8 Units into the skin daily with supper. Sliding scale   insulin glargine 100 UNIT/ML injection Commonly known as:  LANTUS Inject 0.55 mLs (55 Units total) into the skin at bedtime. What changed:  how much to take   leflunomide 20 MG tablet Commonly known as:  ARAVA Take 20 mg by mouth daily.   levothyroxine 200 MCG tablet Commonly known as:  SYNTHROID, LEVOTHROID Take 200 mcg by mouth daily before breakfast.   losartan 50 MG tablet Commonly known as:  COZAAR Take 1 tablet (50 mg total) by mouth daily.   LUBRICATING EYE DROPS OP Apply 1 drop to eye daily as needed (dry eyes).   MEGA MULTIVITAMIN FOR MEN PO Take 1 tablet by mouth daily.   metFORMIN 1000 MG tablet Commonly known as:  GLUCOPHAGE Take 1,000 mg by mouth 2 (two) times daily with a meal.   OXYGEN Inhale 2 L into the lungs continuous.   sertraline 100 MG tablet Commonly known as:  ZOLOFT Take 50 mg by mouth daily.   SPIRIVA HANDIHALER 18 MCG inhalation capsule Generic drug:  tiotropium Place 18 mcg into inhaler and inhale daily.   spironolactone 25 MG tablet Commonly known as:  ALDACTONE Take 1 tablet (25 mg total) by mouth daily.   tamsulosin 0.4 MG Caps capsule Commonly known as:  FLOMAX Take 2 capsules (0.8 mg total) by mouth at bedtime. What changed:   how much to take   VICTOZA 18 MG/3ML Sopn Generic drug:  liraglutide Inject 1.2 mg into the skin at bedtime.   Vitamin D3 2000 units capsule Take 2,000 Units by mouth daily.      No Known Allergies Follow-up Information    Corwin Levins, MD Follow up in 1 week(s).   Specialties:  Internal Medicine, Radiology Why:  hospital discharge follow up, pcp to repeat cxr in 3-4 weeks to ensure resolution of left upper lobe pneumonia. Contact information: [address] Maggie Schwalbe [facility] [place] [address] [phone]            The results of significant diagnostics from this hospitalization (including imaging, microbiology, ancillary and laboratory) are listed below for reference.    Significant Diagnostic Studies: Dg Chest 2 View  Result Date: 12/25/2016 CLINICAL DATA:  Low-grade fever  and chills. Ex-smoker. COPD. Previous coronary stent placement. EXAM: CHEST  2 VIEW COMPARISON:  10/28/2016. FINDINGS: Normal sized heart. Bilateral pleural thickening without significant change. Interval left upper lobe airspace opacity. Stable prominence of the interstitial markings bilaterally and linear scarring on the right. The hemidiaphragms remain flattened. Unremarkable bones. IMPRESSION: 1. Left upper lobe pneumonia. 2. 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 Electronically Signed   By: Beckie Salts M.D.   On: 12/25/2016 15:26    Microbiology: Recent Results (from the past 240 hour(s))  Urine culture     Status: None   Collection Time: 12/25/16  2:17 PM  Result Value Ref Range Status   Specimen Description URINE, CLEAN CATCH  Final   Special Requests NONE  Final   Culture   Final    NO GROWTH Performed at [facility], [address] [place], [place] [address]    Report Status 12/26/2016 FINAL  Final  Culture, blood (routine x 2)     Status: None (Preliminary result)   Collection Time: 12/25/16  3:26 PM  Result Value Ref Range Status    Specimen Description RIGHT ANTECUBITAL  Final   Special Requests   Final    BOTTLES DRAWN AEROBIC AND ANAEROBIC Blood Culture adequate volume   Culture   Final    NO GROWTH 2 DAYS Performed at [facility], [address] [place], [place] [address]    Report Status PENDING  Incomplete  Culture, blood (routine x 2)     Status: None (Preliminary result)   Collection Time: 12/25/16  3:26 PM  Result Value Ref Range Status   Specimen Description BLOOD LEFT HAND  Final   Special Requests   Final    BOTTLES DRAWN AEROBIC AND ANAEROBIC Blood Culture adequate volume   Culture   Final    NO GROWTH 2 DAYS Performed at [facility], [address], [place], [place] [address]    Report Status PENDING  Incomplete  Culture, sputum-assessment     Status: None   Collection Time: 12/26/16  1:21 PM  Result Value Ref Range Status   Specimen Description SPU  Final   Special Requests Immunocompromised  Final   Sputum evaluation THIS SPECIMEN IS ACCEPTABLE FOR SPUTUM CULTURE  Final   Report Status 12/26/2016 FINAL  Final  Culture, respiratory (NON-Expectorated)     Status: None (Preliminary result)   Collection Time: 12/26/16  1:21 PM  Result Value Ref Range Status   Specimen Description SPU  Final   Special Requests Immunocompromised Reflexed from [id number]  Final   Gram Stain   Final    FEW WBC PRESENT, PREDOMINANTLY PMN RARE SQUAMOUS EPITHELIAL CELLS PRESENT MODERATE GRAM VARIABLE ROD RARE GRAM POSITIVE COCCI IN PAIRS RARE YEAST    Culture   Final    NO GROWTH < 24 HOURS Performed at [facility], [address], [place], [place] [address]    Report Status PENDING  Incomplete  Respiratory Panel by PCR     Status: None   Collection Time: 12/26/16  1:35 PM  Result Value Ref Range Status   Adenovirus NOT DETECTED NOT DETECTED Final   Coronavirus 229E NOT DETECTED NOT DETECTED Final   Coronavirus HKU1 NOT DETECTED NOT DETECTED Final   Coronavirus NL63 NOT DETECTED NOT  DETECTED Final   Coronavirus OC43 NOT DETECTED NOT DETECTED Final   Metapneumovirus NOT DETECTED NOT DETECTED Final   Rhinovirus / Enterovirus NOT  DETECTED NOT DETECTED Final   Influenza A NOT DETECTED NOT DETECTED Final   Influenza B NOT DETECTED NOT DETECTED Final   Parainfluenza Virus 1 NOT DETECTED NOT DETECTED Final   Parainfluenza Virus 2 NOT DETECTED NOT DETECTED Final   Parainfluenza Virus 3 NOT DETECTED NOT DETECTED Final   Parainfluenza Virus 4 NOT DETECTED NOT DETECTED Final   Respiratory Syncytial Virus NOT DETECTED NOT DETECTED Final   Bordetella pertussis NOT DETECTED NOT DETECTED Final   Chlamydophila pneumoniae NOT DETECTED NOT DETECTED Final   Mycoplasma pneumoniae NOT DETECTED NOT DETECTED Final    Comment: Performed at [facility], [address], [place], [place] [address]  MRSA PCR Screening     Status: None   Collection Time: 12/27/16  8:32 AM  Result Value Ref Range Status   MRSA by PCR NEGATIVE NEGATIVE Final    Comment:        The GeneXpert MRSA Assay (FDA approved for NASAL specimens only), is one component of a comprehensive MRSA colonization surveillance program. It is not intended to diagnose MRSA infection nor to guide or monitor treatment for MRSA infections.   C difficile quick scan w PCR reflex     Status: None   Collection Time: 12/27/16  2:13 PM  Result Value Ref Range Status   C Diff antigen NEGATIVE NEGATIVE Final   C Diff toxin NEGATIVE NEGATIVE Final   C Diff interpretation No C. difficile detected.  Final     Labs: Basic Metabolic Panel:  Recent Labs Lab 12/23/16 1053 12/25/16 1455 12/26/16 0559 12/27/16 0839 12/28/16 0603  NA 139 135 141 138 139  K 4.9 4.2 3.9 4.3 4.0  CL 100 100* 105 105 107  CO2 25 25 27 25 24   GLUCOSE 160* 248* 121* 321* 211*  BUN 16 20 15 19  21*  CREATININE 0.95 1.03 0.83 1.07 1.01  CALCIUM 8.8 8.8* 8.7* 8.4* 8.1*  MG  --   --   --  1.5* 1.8   Liver Function Tests:  Recent Labs Lab  12/23/16 1053  AST 20  ALT 17  ALKPHOS 53  BILITOT 0.8  PROT 6.2  ALBUMIN 4.0   No results for input(s): LIPASE, AMYLASE in the last 168 hours. No results for input(s): AMMONIA in the last 168 hours. CBC:  Recent Labs Lab 12/23/16 1053 12/25/16 1455 12/26/16 0559 12/27/16 0839 12/28/16 0603  WBC 8.8 9.4 8.5 8.8 8.5  NEUTROABS 6.0 6.7  --   --  5.3  HGB  --  10.4* 10.1* 10.5* 10.0*  HCT 34.6* 32.7* 30.9* 32.8* 30.7*  MCV 84 85.4 84.9 84.3 84.6  PLT 125* 118* 109* 136* 152   Cardiac Enzymes: No results for input(s): CKTOTAL, CKMB, CKMBINDEX, TROPONINI in the last 168 hours. BNP: BNP (last 3 results) No results for input(s): BNP in the last 8760 hours.  ProBNP (last 3 results)  Recent Labs  10/20/16 1038  PROBNP 188    CBG:  Recent Labs Lab 12/27/16 0734 12/27/16 1217 12/27/16 1703 12/27/16 2207 12/28/16 0733  GLUCAP 203* 292* 151* 244* 197*       Signed:  Chai Routh MD, PhD  [facility] 12/28/2016, 9:48 AM

## 2016-12-29 LAB — CULTURE, RESPIRATORY W GRAM STAIN: Culture: NORMAL

## 2016-12-29 LAB — CULTURE, RESPIRATORY

## 2016-12-30 ENCOUNTER — Encounter: Payer: Self-pay | Admitting: Behavioral Health

## 2016-12-30 ENCOUNTER — Telehealth: Payer: Self-pay | Admitting: Behavioral Health

## 2016-12-30 LAB — CULTURE, BLOOD (ROUTINE X 2)
Culture: NO GROWTH
Culture: NO GROWTH
Special Requests: ADEQUATE
Special Requests: ADEQUATE

## 2016-12-30 NOTE — Progress Notes (Deleted)
Subjective:   [name] is a 76 y.o. male who presents for Medicare Annual/Subsequent preventive examination.  Review of Systems:  ROS: No ROS.  Medicare Wellness Visit.   Sleep patterns:    Home Safety/Smoke Alarms: Feels safe in home. Smoke alarms in place.  Living environment; residence and Firearm Safety:  Seat Belt Safety/Bike Helmet: Wears seat belt.   Counseling:   Eye Exam-  Dental-  Male:   CCS-    Last 07/28/06: normal per pt PSA-  Lab Results  Component Value Date   PSA 0.36 10/02/2016   PSA 0.46 10/02/2015   PSA 0.22 03/21/2015       Objective:    Vitals: There were no vitals taken for this visit.  There is no height or weight on file to calculate BMI.  Tobacco History  Smoking Status  . Former Smoker  . Packs/day: 2.50  . Years: 40.00  . Types: Cigarettes, Pipe, Cigars  . Quit date: 07/28/1998  Smokeless Tobacco  . Never Used     Counseling given: Not Answered   Past Medical History:  Diagnosis Date  . Anxiety   . BENIGN PROSTATIC HYPERTROPHY 11/23/2009  . C O P D 07/30/2009  . Cardiomyopathy (HCC) 08/28/2016  . CHRONIC OBSTRUCTIVE PULMONARY DISEASE, ACUTE EXACERBATION 03/01/2010  . CORONARY ARTERY DISEASE 11/23/2009  . 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 DEGENERATIVE JOINT DISEASE 11/23/2009  . DEPRESSION 11/23/2009  . DYSPNEA/SHORTNESS OF BREATH 12/08/2009  . FATIGUE 11/23/2009  . GAIT DISTURBANCE 12/10/2009  . HEMOPTYSIS UNSPECIFIED 05/07/2010  . High cholesterol   . HYPERTENSION 07/30/2009  . HYPOTHYROIDISM 07/30/2009  . 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 On home oxygen therapy    "2-3L; 24/7" (09/10/2016)  . OSA on CPAP   . Pneumonia X 2  . PTSD (post-traumatic stress disorder) 03/10/2012  . 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 RA (rheumatoid arthritis) (HCC) 06/11/2011   "qwhere" (09/10/2016)  . RESPIRATORY FAILURE, CHRONIC 07/31/2009  . Scleritis of both eyes 03/17/2014  . THRUSH 11/06/2009  . 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 Type II diabetes  mellitus (HCC)    Past Surgical History:  Procedure Laterality Date  . 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 ABDOMINAL EXPLORATION SURGERY  02/2004   w/LOA/notes 12/10/2010; small bowel obstruction repair with adhesiolysis   . BACK SURGERY    . CARDIAC CATHETERIZATION     2 heart caths in the past.  One in 2000s showed one ulcerated plaque  Rx medically; Second at [facility] [name] 09/05/2016  . CATARACT EXTRACTION W/ INTRAOCULAR LENS  IMPLANT, BILATERAL Bilateral 2000s  . COLECTOMY     hx of remote ileum resection due to bleeding  . CORONARY ANGIOPLASTY WITH STENT PLACEMENT  09/10/2016  . CORONARY STENT INTERVENTION N/A 09/10/2016   Procedure: Coronary Stent Intervention;  Surgeon: Kathleene Hazel, MD;  Location[facility] INVASIVE CV LAB;  Service: Cardiovascular;  Laterality: N/A;  Distal RCA 4.0x16 Synergy  . FEMORAL EMBOLOECTOMY Left 07/2000   with left leg ischemia; Dr. Hart Rochester, vascular  . GANGLION CYST EXCISION Right    "wrist"; Dr. Teressa Senter  . LUMBAR LAMINECTOMY  1972   Dr. Fannie Knee  . RIGHT/LEFT HEART CATH AND CORONARY ANGIOGRAPHY N/A 09/10/2016   Procedure: Right/Left Heart Cath and Coronary Angiography;  Surgeon: Kathleene Hazel, MD;  Location: [facility] INVASIVE CV LAB;  Service: Cardiovascular;  Laterality: N/A;  . TONSILLECTOMY     Family History  Problem Relation Age of Onset  . Other  Mother        gun shot   History  Sexual Activity  . Sexual activity: Not on file    Outpatient Encounter Prescriptions as of 12/31/2016  Medication Sig  . acetaminophen (TYLENOL) 325 MG tablet Take 325-650 mg by mouth daily as needed for moderate pain or headache.  . albuterol (PROAIR HFA) 108 (90 BASE) MCG/ACT inhaler Inhale 2 puffs into the lungs every 4 (four) hours as needed. Shortness of breath (Patient not taking: Reported on 12/30/2016)  . albuterol (PROVENTIL) (2.5 MG/3ML) 0.083% nebulizer solution 1 vial in nebulizer every 6 hours as needed Dx 496 (Patient not taking:  Reported on 12/30/2016)  . ALPRAZolam (XANAX) 0.5 MG tablet Take 0.5 mg by mouth at bedtime. For sleep and anxiety  . amoxicillin-clavulanate (AUGMENTIN) 875-125 MG tablet Take 1 tablet by mouth every 12 (twelve) hours.  [name] aspirin EC 81 MG tablet Take 1 tablet (81 mg total) by mouth daily.  [name] atorvastatin (LIPITOR) 40 MG tablet Take 1 tablet (40 mg total) by mouth daily.  . bisoprolol (ZEBETA) 5 MG tablet Take 1 tablet (5 mg total) by mouth daily.  . budesonide-formoterol (SYMBICORT) 160-4.5 MCG/ACT inhaler Inhale 2 puffs into the lungs 2 (two) times daily.  . Carboxymethylcellul-Glycerin (LUBRICATING EYE DROPS OP) Apply 1 drop to eye daily as needed (dry eyes).  . Cholecalciferol (VITAMIN D3) 2000 units capsule Take 2,000 Units by mouth daily.   . clopidogrel (PLAVIX) 75 MG tablet Take 1 tablet (75 mg total) by mouth daily with breakfast.  . fluticasone (FLONASE) 50 MCG/ACT nasal spray USE 2 SPRAYS IN EACH NOSTRIL DAILY (Patient not taking: Reported on 12/30/2016)  . furosemide (LASIX) 40 MG tablet TK 1 T PO D PRN FOR FLUID OR EDEMA  . gabapentin (NEURONTIN) 800 MG tablet Take 800 mg by mouth 2 (two) times daily.    . insulin aspart (NOVOLOG) 100 UNIT/ML injection Inject 6-8 Units into the skin daily with supper. Sliding scale  . insulin glargine (LANTUS) 100 UNIT/ML injection Inject 0.55 mLs (55 Units total) into the skin at bedtime.  [name] leflunomide (ARAVA) 20 MG tablet Take 20 mg by mouth daily.   [name] levothyroxine (SYNTHROID, LEVOTHROID) 200 MCG tablet Take 200 mcg by mouth daily before breakfast.  . Liraglutide (VICTOZA) 18 MG/3ML SOPN Inject 1.2 mg into the skin at bedtime.   [name] losartan (COZAAR) 50 MG tablet Take 1 tablet (50 mg total) by mouth daily.  . metFORMIN (GLUCOPHAGE) 1000 MG tablet Take 1,000 mg by mouth 2 (two) times daily with a meal.  . Multiple Vitamins-Minerals (MEGA MULTIVITAMIN FOR MEN PO) Take 1 tablet by mouth daily.   . OXYGEN Inhale 2 L into the lungs continuous.  .  sertraline (ZOLOFT) 100 MG tablet Take 50 mg by mouth daily.   [name] spironolactone (ALDACTONE) 25 MG tablet Take 1 tablet (25 mg total) by mouth daily.  . tamsulosin (FLOMAX) 0.4 MG CAPS capsule Take 2 capsules (0.8 mg total) by mouth at bedtime.  [name] tiotropium (SPIRIVA HANDIHALER) 18 MCG inhalation capsule Place 18 mcg into inhaler and inhale daily.     No facility-administered encounter medications on file as of 12/31/2016.     Activities of Daily Living In your present state of health, do you have any difficulty performing the following activities: 12/25/2016 09/10/2016  Hearing? Y -  Vision? N -  Difficulty concentrating or making decisions? N -  Walking or climbing stairs? Y -  Dressing or bathing? N -  Doing errands, shopping?  N N  Some recent data might be hidden    Patient Care Team: Ernesto Rutherford, MD as Consulting Physician (Ophthalmology) Kalman Shan, MD as Consulting Physician (Pulmonary Disease) System, Provider Not In as Consulting Physician Casimer Lanius, MD as Consulting Physician (Rheumatology) Oretha Milch, MD as Consulting Physician (Pulmonary Disease) Lewayne Bunting, MD as Consulting Physician (Cardiology)   Assessment:    Physical assessment deferred to PCP.  Exercise Activities and Dietary recommendations   Diet (meal preparation, eat out, water intake, caffeinated beverages, dairy products, fruits and vegetables): {Desc; diets:16563} Breakfast: Lunch:  Dinner:      Goals      Patient Stated   . maintain (pt-stated)          Continue to maintain health with current practices; Goes to the gym in the winter; Does all the house hold work at home; cooks, Lexicographer , etc.       Fall Risk Fall Risk  10/02/2016 04/04/2016 10/02/2015 03/21/2015 03/17/2014  Falls in the past year? Yes No No No No  Number falls in past yr: 1 - - - -  Injury with Fall? No - - - -   Depression Screen PHQ 2/9 Scores 10/02/2016 04/04/2016 10/02/2015 03/21/2015  PHQ - 2 Score 0 1 0 0     Cognitive Function        Immunization History  Administered Date(s) Administered  . Influenza Split 04/14/2011, 04/27/2013  . Influenza Whole 05/28/2009, 04/27/2010, 03/31/2012  . Influenza, High Dose Seasonal PF 03/21/2015  . Influenza,inj,Quad PF,36+ Mos 03/17/2014, 04/04/2016  . Influenza-Unspecified 05/26/2011, 05/07/2012  . Pneumococcal Conjugate-13 09/20/2013  . Pneumococcal Polysaccharide-23 05/28/2008, 04/25/2010  . Pneumococcal-Unspecified 04/27/2013  . Td 11/23/2009  . Zoster 04/28/2009   Screening Tests Health Maintenance  Topic Date Due  . COLONOSCOPY  07/28/2016  . INFLUENZA VACCINE  02/25/2017  . FOOT EXAM  04/04/2017  . HEMOGLOBIN A1C  04/04/2017  . OPHTHALMOLOGY EXAM  09/24/2017  . TETANUS/TDAP  11/24/2019  . PNA vac Low Risk Adult  Completed      Plan:   ***  I have personally reviewed and noted the following in the patient's chart:   . Medical and social history . Use of alcohol, tobacco or illicit drugs  . Current medications and supplements . Functional ability and status . Nutritional status . Physical activity . Advanced directives . List of other physicians . Hospitalizations, surgeries, and ER visits in previous 12 months . Vitals . Screenings to include cognitive, depression, and falls . Referrals and appointments  In addition, I have reviewed and discussed with patient certain preventive protocols, quality metrics, and best practice recommendations. A written personalized care plan for preventive services as well as general preventive health recommendations were provided to patient.     Avon Gully, [place]  12/30/2016

## 2016-12-30 NOTE — Telephone Encounter (Signed)
Pre-Visit Call completed with patient and chart updated.   Pre-Visit Info documented in Specialty Comments under SnapShot.    

## 2016-12-31 ENCOUNTER — Ambulatory Visit (INDEPENDENT_AMBULATORY_CARE_PROVIDER_SITE_OTHER): Payer: Medicare Other | Admitting: Family Medicine

## 2016-12-31 ENCOUNTER — Encounter: Payer: Self-pay | Admitting: Family Medicine

## 2016-12-31 VITALS — BP 92/64 | HR 79 | Temp 98.0°F | Resp 14 | Ht 77.0 in | Wt 217.6 lb

## 2016-12-31 DIAGNOSIS — B370 Candidal stomatitis: Secondary | ICD-10-CM | POA: Diagnosis not present

## 2016-12-31 DIAGNOSIS — Z09 Encounter for follow-up examination after completed treatment for conditions other than malignant neoplasm: Secondary | ICD-10-CM

## 2016-12-31 LAB — CBC
HEMATOCRIT: 32.8 % — AB (ref 39.0–52.0)
Hemoglobin: 10.7 g/dL — ABNORMAL LOW (ref 13.0–17.0)
MCHC: 32.6 g/dL (ref 30.0–36.0)
MCV: 83.4 fl (ref 78.0–100.0)
Platelets: 208 10*3/uL (ref 150.0–400.0)
RBC: 3.94 Mil/uL — AB (ref 4.22–5.81)
RDW: 15 % (ref 11.5–15.5)
WBC: 7.1 10*3/uL (ref 4.0–10.5)

## 2016-12-31 MED ORDER — NYSTATIN 100000 UNIT/ML MT SUSP: 5.0000 mL | Freq: Four times a day (QID) | OROMUCOSAL | 0 refills | Status: DC

## 2016-12-31 NOTE — Patient Instructions (Addendum)
Make sure you remember to rinse your mouth out after you use your Symbicort.  Cut your Spironolactone in half.

## 2016-12-31 NOTE — Progress Notes (Signed)
Chief Complaint  Patient presents with  . Transitions Of Care    Pt is not able to eat Pt has has had diarrhea since he has been on antibiotic     Subjective: Patient is a 76 y.o. male here for hosp f/u  He was admitted to the hospital at [facility] for left upper lobe pneumonia. He is currently on Augmentin which has been giving him diarrhea. He is compliant with his medicine. He is not compliant with rinsing his mouth out after Symbicort use. He does not routinely check his blood pressure home. He is on his baseline 2 L of nasal cannula. He is on CPAP at night. His breathing has improved since his illness.  EF is 35-40% as of 08/28/16    ROS: Heart: Denies chest pain  Lungs: Denies current SOB   Family History  Problem Relation Age of Onset  . Other Mother        gun shot   Past Medical History:  Diagnosis Date  . Anxiety   . BENIGN PROSTATIC HYPERTROPHY 11/23/2009  . Cardiomyopathy (HCC) 08/28/2016  . COPD (chronic obstructive pulmonary disease) (HCC)   . CORONARY ARTERY DISEASE 11/23/2009  . 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 DEGENERATIVE JOINT DISEASE 11/23/2009  . DEPRESSION 11/23/2009  . FATIGUE 11/23/2009  . GAIT DISTURBANCE 12/10/2009  . HEMOPTYSIS UNSPECIFIED 05/07/2010  . High cholesterol   . HYPERTENSION 07/30/2009  . HYPOTHYROIDISM 07/30/2009  . 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 On home oxygen therapy    "2-3L; 24/7" (09/10/2016)  . OSA on CPAP   . PTSD (post-traumatic stress disorder) 03/10/2012  . 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 RA (rheumatoid arthritis) (HCC) 06/11/2011   "qwhere" (09/10/2016)  . RESPIRATORY FAILURE, CHRONIC 07/31/2009  . Scleritis of both eyes 03/17/2014  . 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 Type II diabetes mellitus (HCC)    No Known Allergies  Current Outpatient Prescriptions:  .  acetaminophen (TYLENOL) 325 MG tablet, Take 325-650 mg by mouth daily as needed for moderate pain or headache., Disp: , Rfl:  .  albuterol (PROAIR HFA) 108 (90 BASE)  MCG/ACT inhaler, Inhale 2 puffs into the lungs every 4 (four) hours as needed. Shortness of breath, Disp: 1 Inhaler, Rfl: 6 .  albuterol (PROVENTIL) (2.5 MG/3ML) 0.083% nebulizer solution, 1 vial in nebulizer every 6 hours as needed Dx 496, Disp: 120 mL, Rfl: 6 .  ALPRAZolam (XANAX) 0.5 MG tablet, Take 0.5 mg by mouth at bedtime. For sleep and anxiety, Disp: , Rfl:  .  amoxicillin-clavulanate (AUGMENTIN) 875-125 MG tablet, Take 1 tablet by mouth every 12 (twelve) hours., Disp: 6 tablet, Rfl: 0 .  aspirin EC 81 MG tablet, Take 1 tablet (81 mg total) by mouth daily., Disp: , Rfl:  .  atorvastatin (LIPITOR) 40 MG tablet, Take 1 tablet (40 mg total) by mouth daily., Disp: 90 tablet, Rfl: 3 .  bisoprolol (ZEBETA) 5 MG tablet, Take 1 tablet (5 mg total) by mouth daily., Disp: 30 tablet, Rfl: 11 .  budesonide-formoterol (SYMBICORT) 160-4.5 MCG/ACT inhaler, Inhale 2 puffs into the lungs 2 (two) times daily., Disp: 1 Inhaler, Rfl: 11 .  Carboxymethylcellul-Glycerin (LUBRICATING EYE DROPS OP), Apply 1 drop to eye daily as needed (dry eyes)., Disp: , Rfl:  .  Cholecalciferol (VITAMIN D3) 2000 units capsule, Take 2,000 Units by mouth daily. , Disp: , Rfl:  .  clopidogrel (PLAVIX) 75 MG tablet, Take 1 tablet (75 mg total) by mouth daily with breakfast., Disp: 30 tablet, Rfl: 11 .  fluticasone (FLONASE) 50 MCG/ACT nasal spray, USE 2 SPRAYS IN EACH NOSTRIL DAILY, Disp: 16 g, Rfl: 2 .  furosemide (LASIX) 40 MG tablet, TK 1 T PO D PRN FOR FLUID OR EDEMA, Disp: , Rfl: 11 .  gabapentin (NEURONTIN) 800 MG tablet, Take 800 mg by mouth 2 (two) times daily.  , Disp: , Rfl:  .  insulin aspart (NOVOLOG) 100 UNIT/ML injection, Inject 6-8 Units into the skin daily with supper. Sliding scale, Disp: , Rfl:  .  insulin glargine (LANTUS) 100 UNIT/ML injection, Inject 0.55 mLs (55 Units total) into the skin at bedtime., Disp: 40 mL, Rfl: 0 .  leflunomide (ARAVA) 20 MG tablet, Take 20 mg by mouth daily. , Disp: , Rfl:  .   levothyroxine (SYNTHROID, LEVOTHROID) 200 MCG tablet, Take 200 mcg by mouth daily before breakfast., Disp: , Rfl:  .  Liraglutide (VICTOZA) 18 MG/3ML SOPN, Inject 1.2 mg into the skin at bedtime. , Disp: , Rfl:  .  losartan (COZAAR) 50 MG tablet, Take 1 tablet (50 mg total) by mouth daily., Disp: 30 tablet, Rfl: 0 .  metFORMIN (GLUCOPHAGE) 1000 MG tablet, Take 1,000 mg by mouth 2 (two) times daily with a meal., Disp: , Rfl:  .  Multiple Vitamins-Minerals (MEGA MULTIVITAMIN FOR MEN PO), Take 1 tablet by mouth daily. , Disp: , Rfl:  .  OXYGEN, Inhale 2 L into the lungs continuous., Disp: , Rfl:  .  sertraline (ZOLOFT) 100 MG tablet, Take 50 mg by mouth daily. , Disp: , Rfl:  .  tamsulosin (FLOMAX) 0.4 MG CAPS capsule, Take 2 capsules (0.8 mg total) by mouth at bedtime., Disp: 30 capsule, Rfl: 0 .  tiotropium (SPIRIVA HANDIHALER) 18 MCG inhalation capsule, Place 18 mcg into inhaler and inhale daily.  , Disp: , Rfl:  .  nystatin (MYCOSTATIN) 100000 UNIT/ML suspension, Take 5 mLs (500,000 Units total) by mouth 4 (four) times daily., Disp: 473 mL, Rfl: 0 .  spironolactone (ALDACTONE) 25 MG tablet, Take 0.5 tablets (12.5 mg total) by mouth daily., Disp: 90 tablet, Rfl: 3  Objective: BP 92/64 (BP Location: Left Arm, Patient Position: Sitting, Cuff Size: Normal)   Pulse 79   Temp 98 F (36.7 C) (Oral)   Resp 14   Ht 6\' 5"  (1.956 m)   Wt 217 lb 9.6 oz (98.7 kg)   SpO2 93%   BMI 25.80 kg/m  General: Awake, appears stated age HEENT: MMM, white patches over soft palate, blue rim around iris over sclera b/l Heart: RRR, no LE edema Lungs: CTAB- distant sounds, no rales, wheezes or rhonchi. No accessory muscle use Abd: BS+, soft, NT, ND, no masses or organomegaly Psych: Age appropriate judgment and insight, normal affect and mood  Assessment and Plan: Hospital discharge follow-up - Plan: CBC  Oral thrush - Plan: nystatin (MYCOSTATIN) 100000 UNIT/ML suspension  Orders as above.  Decrease dose of  Spironolactone to 12.5 mg daily given BP. If his cardiologist wishes to go back to 25 mg daily, will defer to them. Keep hydrated with electrolyte replacement (Propel in his case) through end of diarrhea which is likely caused by Augmentin. Finish last pill tonight. Encouraged him to rinse mouth out after each use of Symbicort. F/u in 4 weeks to see how his weight and BP are doing. The patient and his wife voiced understanding and agreement to the plan.  [place], DO 12/31/16  5:46 PM

## 2017-01-01 ENCOUNTER — Telehealth: Payer: Self-pay | Admitting: Family Medicine

## 2017-01-01 DIAGNOSIS — D649 Anemia, unspecified: Secondary | ICD-10-CM

## 2017-01-01 NOTE — Telephone Encounter (Signed)
Reviewed previous labs. I do not see a work up. Last colonoscopy was 2008. Please verify with patient that he is interested in having a colonoscopy done in addition to follow up lab work. If so, I have placed future orders to follow up on this. A deeper workup may be needed if these are all normal. Please place order for referral to GI for colonoscopy if he is OK with this.

## 2017-01-02 NOTE — Telephone Encounter (Signed)
Called and spoke with the pt and informed him of the message below.  Pt verbalized understanding and stated that he does not feel he would want the Colonoscopy.  He said that he was told that once you reach 71 you don't need another one.  Pt was scheduled a lab appt for (Mon-01/05/17 @ 8:30am).//AB/CMA

## 2017-01-05 ENCOUNTER — Other Ambulatory Visit (INDEPENDENT_AMBULATORY_CARE_PROVIDER_SITE_OTHER): Payer: Medicare Other

## 2017-01-05 DIAGNOSIS — D649 Anemia, unspecified: Secondary | ICD-10-CM | POA: Diagnosis not present

## 2017-01-05 LAB — VITAMIN B12: Vitamin B-12: 509 pg/mL (ref 211–911)

## 2017-01-05 LAB — IBC PANEL
IRON: 42 ug/dL (ref 42–165)
Saturation Ratios: 12.5 % — ABNORMAL LOW (ref 20.0–50.0)
TRANSFERRIN: 240 mg/dL (ref 212.0–360.0)

## 2017-01-05 LAB — FERRITIN: Ferritin: 78.5 ng/mL (ref 22.0–322.0)

## 2017-01-05 LAB — FOLATE: FOLATE: 15.8 ng/mL (ref >5.9)

## 2017-01-13 NOTE — Progress Notes (Signed)
HPI: FU CAD; previously followed by Dr Tenny Craw. 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 Carotid dopplers 1/18 showed 40-59 left and 1-39 right. Echo 2/18 showed EF 35-40. 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 Admitted with pneumonia and COPD 6/18. Since last seen, the patient has dyspnea with more extreme activities but not with routine activities. It is relieved with rest. It is not associated with chest pain. There is no orthopnea, PND or pedal edema. There is no syncope or palpitations. There is no exertional chest pain.   Current Outpatient Prescriptions  Medication Sig Dispense Refill  . acetaminophen (TYLENOL) 325 MG tablet Take 325-650 mg by mouth daily as needed for moderate pain or headache.    . albuterol (PROAIR HFA) 108 (90 BASE) MCG/ACT inhaler Inhale 2 puffs into the lungs every 4 (four) hours as needed. Shortness of breath 1 Inhaler 6  . albuterol (PROVENTIL) (2.5 MG/3ML) 0.083% nebulizer solution 1 vial in nebulizer every 6 hours as needed Dx 496 120 mL 6  . ALPRAZolam (XANAX) 0.5 MG tablet Take 0.5 mg by mouth at bedtime. For sleep and anxiety    . aspirin EC 81 MG tablet Take 1 tablet (81 mg total) by mouth daily.    [name] atorvastatin (LIPITOR) 40 MG tablet Take 1 tablet (40 mg total) by mouth daily. 90 tablet 3  . bisoprolol (ZEBETA) 5 MG tablet Take 1 tablet (5 mg total) by mouth daily. 30 tablet 11  . budesonide-formoterol (SYMBICORT) 160-4.5 MCG/ACT inhaler Inhale 2 puffs into the lungs 2 (two) times daily. 1 Inhaler 11  . Carboxymethylcellul-Glycerin (LUBRICATING EYE DROPS OP) Apply 1 drop to eye daily as needed (dry eyes).    . Cholecalciferol (VITAMIN D3) 2000 units capsule Take 2,000 Units by mouth daily.     . clopidogrel (PLAVIX) 75 MG tablet Take 1 tablet (75  mg total) by mouth daily with breakfast. 30 tablet 11  . etanercept (ENBREL SURECLICK) 50 MG/ML injection Inject 50 mg into the skin once a week.    . fluticasone (FLONASE) 50 MCG/ACT nasal spray USE 2 SPRAYS IN EACH NOSTRIL DAILY 16 g 2  . furosemide (LASIX) 40 MG tablet TK 1 T PO D PRN FOR FLUID OR EDEMA  11  . gabapentin (NEURONTIN) 800 MG tablet Take 800 mg by mouth 2 (two) times daily.      . insulin aspart (NOVOLOG) 100 UNIT/ML injection Inject 6-8 Units into the skin daily with supper. Sliding scale    . insulin glargine (LANTUS) 100 UNIT/ML injection Inject 0.55 mLs (55 Units total) into the skin at bedtime. 40 mL 0  . leflunomide (ARAVA) 20 MG tablet Take 20 mg by mouth daily.     [name] levothyroxine (SYNTHROID, LEVOTHROID) 200 MCG tablet Take 200 mcg by mouth daily before breakfast.    . Liraglutide (VICTOZA) 18 MG/3ML SOPN Inject 1.2 mg into the skin at bedtime.     [name] losartan (COZAAR) 50 MG tablet Take 1 tablet (50 mg total) by mouth daily. 30 tablet 0  . metFORMIN (GLUCOPHAGE) 1000 MG tablet Take 1,000 mg by mouth 2 (two) times daily with a meal.    . Multiple Vitamins-Minerals (MEGA MULTIVITAMIN FOR MEN PO) Take 1 tablet by mouth daily.     [name] nystatin (MYCOSTATIN)  100000 UNIT/ML suspension Take 5 mLs (500,000 Units total) by mouth 4 (four) times daily. 473 mL 0  . OXYGEN Inhale 2 L into the lungs continuous.    . sertraline (ZOLOFT) 100 MG tablet Take 50 mg by mouth daily.     [name] spironolactone (ALDACTONE) 25 MG tablet Take 0.5 tablets (12.5 mg total) by mouth daily. 90 tablet 3  . tamsulosin (FLOMAX) 0.4 MG CAPS capsule Take 2 capsules (0.8 mg total) by mouth at bedtime. 30 capsule 0  . tiotropium (SPIRIVA HANDIHALER) 18 MCG inhalation capsule Place 18 mcg into inhaler and inhale daily.       No current facility-administered medications for this visit.      Past Medical History:  Diagnosis Date  . Anxiety   . BENIGN PROSTATIC HYPERTROPHY 11/23/2009  . Cardiomyopathy (HCC)  08/28/2016  . COPD (chronic obstructive pulmonary disease) (HCC)   . CORONARY ARTERY DISEASE 11/23/2009  . 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 DEGENERATIVE JOINT DISEASE 11/23/2009  . DEPRESSION 11/23/2009  . FATIGUE 11/23/2009  . GAIT DISTURBANCE 12/10/2009  . HEMOPTYSIS UNSPECIFIED 05/07/2010  . High cholesterol   . HYPERTENSION 07/30/2009  . HYPOTHYROIDISM 07/30/2009  . 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 On home oxygen therapy    "2-3L; 24/7" (09/10/2016)  . OSA on CPAP   . PTSD (post-traumatic stress disorder) 03/10/2012  . 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 RA (rheumatoid arthritis) (HCC) 06/11/2011   "qwhere" (09/10/2016)  . RESPIRATORY FAILURE, CHRONIC 07/31/2009  . Scleritis of both eyes 03/17/2014  . 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 Type II diabetes mellitus (HCC)     Past Surgical History:  Procedure Laterality Date  . 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 ABDOMINAL EXPLORATION SURGERY  02/2004   w/LOA/notes 12/10/2010; small bowel obstruction repair with adhesiolysis   . BACK SURGERY    . CARDIAC CATHETERIZATION     2 heart caths in the past.  One in 2000s showed one ulcerated plaque  Rx medically; Second at [facility] [name] 09/05/2016  . CATARACT EXTRACTION W/ INTRAOCULAR LENS  IMPLANT, BILATERAL Bilateral 2000s  . COLECTOMY     hx of remote ileum resection due to bleeding  . CORONARY ANGIOPLASTY WITH STENT PLACEMENT  09/10/2016  . CORONARY STENT INTERVENTION N/A 09/10/2016   Procedure: Coronary Stent Intervention;  Surgeon: Kathleene Hazel, MD;  Location: [facility] INVASIVE CV LAB;  Service: Cardiovascular;  Laterality: N/A;  Distal RCA 4.0x16 Synergy  . FEMORAL EMBOLOECTOMY Left 07/2000   with left leg ischemia; Dr. Hart [place], vascular  . GANGLION CYST EXCISION Right    "wrist"; Dr. Teressa Senter  . LUMBAR LAMINECTOMY  1972   Dr. Fannie Knee  . RIGHT/LEFT HEART CATH AND CORONARY ANGIOGRAPHY N/A 09/10/2016   Procedure: Right/Left Heart Cath and Coronary Angiography;  Surgeon:  Kathleene Hazel, MD;  Location: [facility] INVASIVE CV LAB;  Service: Cardiovascular;  Laterality: N/A;  . TONSILLECTOMY      Social History   Social History  . Marital status: Married    Spouse name: N/A  . Number of children: N/A  . Years of education: N/A   Occupational History  . disabled veteran, Ex Human resources officer Retired   Social History Main Topics  . Smoking status: Former Smoker    Packs/day: 2.50    Years: 40.00    Types: Cigarettes, Pipe, Cigars    Quit date: 07/28/1998  . Smokeless tobacco: Never Used  . Alcohol use No  . Drug use: No  . Sexual  activity: Not on file   Other Topics Concern  . Not on file   Social History Narrative   Lives in the home with his wife   Sees [place] every 6 month for meds.   Denies asbestos exposure    Family History  Problem Relation Age of Onset  . Other Mother        gun shot    ROS: no fevers or chills, productive cough, hemoptysis, dysphasia, odynophagia, melena, hematochezia, dysuria, hematuria, rash, seizure activity, orthopnea, PND, pedal edema, claudication. Remaining systems are negative.  Physical Exam: Well-developed well-nourished in no acute distress.  Skin is warm and dry.  HEENT is normal.  Neck is supple.  Chest is clear to auscultation with normal expansion.  Cardiovascular exam is regular rate and rhythm.  Abdominal exam nontender or distended. No masses palpated. Extremities show no edema. neuro grossly intact   A/P  1 Coronary artery disease-status post PCI of RCA. Continue aspirin, Plavix and statin.  2 carotid artery disease-plan follow-up carotid Dopplers January 2019.  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  4 ischemic cardiomyopathy-continue beta blocker and ARB.  5 hypertension-blood pressure is elevated but he has not taken his medications today . Continue present medications and follow.  6  hyperlipidemia-continue statin.  7 chronic systolic congestive heart failure-patient appears to be euvolemic. Continue present dose of diuretics.   Olga Millers, MD

## 2017-01-21 ENCOUNTER — Encounter: Payer: Self-pay | Admitting: Cardiology

## 2017-01-21 ENCOUNTER — Ambulatory Visit (INDEPENDENT_AMBULATORY_CARE_PROVIDER_SITE_OTHER): Payer: Medicare Other | Admitting: Cardiology

## 2017-01-21 VITALS — BP 159/65 | HR 52 | Ht 75.0 in | Wt 224.8 lb

## 2017-01-21 DIAGNOSIS — I10 Essential (primary) hypertension: Secondary | ICD-10-CM | POA: Diagnosis not present

## 2017-01-21 DIAGNOSIS — I2510 Atherosclerotic heart disease of native coronary artery without angina pectoris: Secondary | ICD-10-CM

## 2017-01-21 DIAGNOSIS — I712 Thoracic aortic aneurysm, without rupture, unspecified: Secondary | ICD-10-CM

## 2017-01-21 DIAGNOSIS — I723 Aneurysm of iliac artery: Secondary | ICD-10-CM

## 2017-01-21 DIAGNOSIS — E7800 Pure hypercholesterolemia, unspecified: Secondary | ICD-10-CM

## 2017-01-21 DIAGNOSIS — I779 Disorder of arteries and arterioles, unspecified: Secondary | ICD-10-CM | POA: Diagnosis not present

## 2017-01-21 DIAGNOSIS — I739 Peripheral vascular disease, unspecified: Secondary | ICD-10-CM

## 2017-01-21 NOTE — Patient Instructions (Signed)
Medication Instructions:   NO CHANGE  Testing/Procedures:  Non-Cardiac CT Angiography (CTA), is a special type of CT scan that uses a computer to produce multi-dimensional views of major blood vessels throughout the body. In CT angiography, a contrast material is injected through an IV to help visualize the blood vessels CHEST/ ABDOMEN AND PELVIS  Follow-Up:  Your physician wants you to follow-up in: 6 MONTHS WITH DR Frederick Peers will receive a reminder letter in the mail two months in advance. If you don't receive a letter, please call our office to schedule the follow-up appointment.   If you need a refill on your cardiac medications before your next appointment, please call your pharmacy.

## 2017-01-22 ENCOUNTER — Ambulatory Visit (HOSPITAL_BASED_OUTPATIENT_CLINIC_OR_DEPARTMENT_OTHER)
Admission: RE | Admit: 2017-01-22 | Discharge: 2017-01-22 | Disposition: A | Discharge status: Home / Self Care | Payer: Medicare Other | Source: Ambulatory Visit | Attending: Cardiology | Admitting: Cardiology

## 2017-01-22 ENCOUNTER — Encounter (HOSPITAL_BASED_OUTPATIENT_CLINIC_OR_DEPARTMENT_OTHER): Payer: Self-pay

## 2017-01-22 ENCOUNTER — Telehealth: Payer: Self-pay | Admitting: Cardiology

## 2017-01-22 ENCOUNTER — Ambulatory Visit: Payer: Medicare Other | Admitting: Internal Medicine

## 2017-01-22 DIAGNOSIS — I723 Aneurysm of iliac artery: Secondary | ICD-10-CM | POA: Diagnosis not present

## 2017-01-22 DIAGNOSIS — I722 Aneurysm of renal artery: Secondary | ICD-10-CM | POA: Diagnosis not present

## 2017-01-22 DIAGNOSIS — I724 Aneurysm of artery of lower extremity: Secondary | ICD-10-CM | POA: Insufficient documentation

## 2017-01-22 DIAGNOSIS — I712 Thoracic aortic aneurysm, without rupture, unspecified: Secondary | ICD-10-CM

## 2017-01-22 DIAGNOSIS — J90 Pleural effusion, not elsewhere classified: Secondary | ICD-10-CM | POA: Insufficient documentation

## 2017-01-22 DIAGNOSIS — K8020 Calculus of gallbladder without cholecystitis without obstruction: Secondary | ICD-10-CM | POA: Insufficient documentation

## 2017-01-22 MED ORDER — IOPAMIDOL (ISOVUE-370) INJECTION 76%
100.0000 mL | Freq: Once | INTRAVENOUS | Status: AC | PRN
  Administered 2017-01-22: 100 mL via INTRAVENOUS

## 2017-01-22 NOTE — Telephone Encounter (Signed)
New message    Pt states he is returning a call to [place].

## 2017-01-22 NOTE — Telephone Encounter (Signed)
cc'ing Debra - cannot find documentation for earlier call.

## 2017-01-22 NOTE — Telephone Encounter (Signed)
Spoke with pt, aware of CT results and the need to keep follow up appointments. Results forwarded to PCP and pulmonary.

## 2017-01-23 ENCOUNTER — Telehealth: Payer: Self-pay | Admitting: Adult Health

## 2017-01-23 DIAGNOSIS — J690 Pneumonitis due to inhalation of food and vomit: Secondary | ICD-10-CM

## 2017-01-23 NOTE — Telephone Encounter (Signed)
He has new mss Left upper lobe - crop up in past 1 year. Did he have a cold or pneumnia recently?  Based on answer for this he needs PET scan now or followup CT later on   THanks  Dr. Kalman Shan, M.D., [facility].C.P Pulmonary and Critical Care Medicine Staff Physician [facility] [place] [facility] Pager: [phone], If no answer or between  15:00h - 7:00h: call [phone]  01/23/2017 1:33 PM

## 2017-01-23 NOTE — Telephone Encounter (Signed)
Spoke with pt's wife August Saucer (dpr on file), states that pt had a ct angio of chest/abdomen/pelvis yesterday through Dr. Jens Som and was told to follow up with pulmonary for additional imaging.  Reports available in Epic.  Pt and wife are wanting to know if any additional scans are needed if they could be ordered to be done before 7/12 OV with TP to be discussed at that visit. Pt would prefer to have imaging done at [facility] if possible.  MR please advise.  Thanks.

## 2017-01-23 NOTE — Telephone Encounter (Signed)
Spoke with pt, states that he was seen in ED on 5/31 for CAP (see pt's hospitalization records in Epic).  Pt states that he is currently coughing up yellow mucus but notes that he "doesn't feel bad".   Pt is very anxious after reading the CT report on Epic.    MR please advise.  Thanks!

## 2017-01-23 NOTE — Telephone Encounter (Signed)
Ok this makes sense because the ct mass did not look like classic cancer . So it could still be the residual pneumonia from may 2018 that shows up on CT June 2018. The main thing is we need to keep an eye  Plan Do cxr 2 view in 2 weeks  - mid [facility] 2018 and return to see an APP - that will be 6 weeks from the original admit for pneumonia  Dr. Kalman Shan, M.D., [facility].C.P Pulmonary and Critical Care Medicine Staff Physician [place] [place] [facility] Pager: [phone], If no answer or between  15:00h - 7:00h: call [phone]  01/23/2017 11:33 PM

## 2017-01-26 NOTE — Telephone Encounter (Signed)
Spoke with pt He has been scheduled with [facility] on 719/18 at 10am. Order has been placed for CXR. Nothing further was needed.

## 2017-01-29 ENCOUNTER — Ambulatory Visit (INDEPENDENT_AMBULATORY_CARE_PROVIDER_SITE_OTHER): Payer: Medicare Other | Admitting: Family Medicine

## 2017-01-29 ENCOUNTER — Encounter: Payer: Self-pay | Admitting: Family Medicine

## 2017-01-29 VITALS — BP 148/72 | HR 54 | Temp 97.8°F | Ht 74.0 in | Wt 224.0 lb

## 2017-01-29 DIAGNOSIS — R195 Other fecal abnormalities: Secondary | ICD-10-CM | POA: Diagnosis not present

## 2017-01-29 DIAGNOSIS — R2681 Unsteadiness on feet: Secondary | ICD-10-CM | POA: Diagnosis not present

## 2017-01-29 LAB — COMPREHENSIVE METABOLIC PANEL
ALT: 13 U/L (ref 0–53)
AST: 19 U/L (ref 0–37)
Albumin: 4 g/dL (ref 3.5–5.2)
Alkaline Phosphatase: 49 U/L (ref 39–117)
BUN: 16 mg/dL (ref 6–23)
CHLORIDE: 107 meq/L (ref 96–112)
CO2: 29 mEq/L (ref 19–32)
Calcium: 9.4 mg/dL (ref 8.4–10.5)
Creatinine, Ser: 0.94 mg/dL (ref 0.40–1.50)
GFR: 83.06 mL/min (ref >60.00)
GLUCOSE: 127 mg/dL — AB (ref 70–99)
POTASSIUM: 4.7 meq/L (ref 3.5–5.1)
SODIUM: 142 meq/L (ref 135–145)
Total Bilirubin: 0.6 mg/dL (ref 0.2–1.2)
Total Protein: 6.5 g/dL (ref 6.0–8.3)

## 2017-01-29 LAB — CBC WITH DIFFERENTIAL/PLATELET
BASOS PCT: 1.2 % (ref 0.0–3.0)
Basophils Absolute: 0.1 10*3/uL (ref 0.0–0.1)
EOS PCT: 12.5 % — AB (ref 0.0–5.0)
Eosinophils Absolute: 0.7 10*3/uL (ref 0.0–0.7)
HCT: 33.9 % — ABNORMAL LOW (ref 39.0–52.0)
Hemoglobin: 11 g/dL — ABNORMAL LOW (ref 13.0–17.0)
Lymphocytes Relative: 25.6 % (ref 12.0–46.0)
Lymphs Abs: 1.3 10*3/uL (ref 0.7–4.0)
MCHC: 32.5 g/dL (ref 30.0–36.0)
MCV: 84.2 fl (ref 78.0–100.0)
MONO ABS: 0.7 10*3/uL (ref 0.1–1.0)
Monocytes Relative: 13.9 % — ABNORMAL HIGH (ref 3.0–12.0)
NEUTROS PCT: 46.8 % (ref 43.0–77.0)
Neutro Abs: 2.4 10*3/uL (ref 1.4–7.7)
Platelets: 122 10*3/uL — ABNORMAL LOW (ref 150.0–400.0)
RBC: 4.02 Mil/uL — AB (ref 4.22–5.81)
RDW: 16.6 % — AB (ref 11.5–15.5)
WBC: 5.2 10*3/uL (ref 4.0–10.5)

## 2017-01-29 NOTE — Patient Instructions (Addendum)
Give [place] 2-3 business days to get the results of your labs back.   Let us know if you need anything.  Metamucil daily to add bulk to your stool.

## 2017-01-29 NOTE — Progress Notes (Signed)
Chief Complaint  Patient presents with  . Follow-up    appetite a little better  . Extremity Weakness    Subjective: Patient is a 76 y.o. male here for f/u appetite.  The patient was seen around 1 month ago for hospital follow-up. At that time as appetite was not doing very well. Since that time, he is noticed significant improvement. His energy is also improved.  He does note that his bowel movements have been loose over the past month after getting out of the hospital. He's not been going more frequent, however is having some incontinence issues at night. He denies any pain, fevers, bleeding, or dark/tarry stools. No medication changes other than decreasing the dose of the spironolactone. Pt does not chew sugar free gum. He has not been trying anything at home for this. He drinks lactose-free milk. No other association with particular foods.  He also notes that his balance is poor and his strength in lower extremities is also poor. The pharmacy who manages his diabetic medications recommended bringing up gabapentin as being a possible cause. There is been no recent change in this. He takes and milligrams twice daily. He uses this for diabetic neuropathy. Each leg is affected equally.  ROS: Const: No fevers GI: As noted in HPI  Family History  Problem Relation Age of Onset  . Other Mother        gun shot   Past Medical History:  Diagnosis Date  . Anxiety   . BENIGN PROSTATIC HYPERTROPHY 11/23/2009  . Cardiomyopathy (HCC) 08/28/2016  . COPD (chronic obstructive pulmonary disease) (HCC)   . CORONARY ARTERY DISEASE 11/23/2009  . 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 DEGENERATIVE JOINT DISEASE 11/23/2009  . DEPRESSION 11/23/2009  . FATIGUE 11/23/2009  . GAIT DISTURBANCE 12/10/2009  . HEMOPTYSIS UNSPECIFIED 05/07/2010  . High cholesterol   . HYPERTENSION 07/30/2009  . HYPOTHYROIDISM 07/30/2009  . 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 On home oxygen therapy    "2-3L; 24/7" (09/10/2016)   . OSA on CPAP   . PTSD (post-traumatic stress disorder) 03/10/2012  . 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 RA (rheumatoid arthritis) (HCC) 06/11/2011   "qwhere" (09/10/2016)  . RESPIRATORY FAILURE, CHRONIC 07/31/2009  . Scleritis of both eyes 03/17/2014  . 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 Type II diabetes mellitus (HCC)    Not on File  Current Outpatient Prescriptions:  .  acetaminophen (TYLENOL) 325 MG tablet, Take 325-650 mg by mouth daily as needed for moderate pain or headache., Disp: , Rfl:  .  albuterol (PROAIR HFA) 108 (90 BASE) MCG/ACT inhaler, Inhale 2 puffs into the lungs every 4 (four) hours as needed. Shortness of breath, Disp: 1 Inhaler, Rfl: 6 .  albuterol (PROVENTIL) (2.5 MG/3ML) 0.083% nebulizer solution, 1 vial in nebulizer every 6 hours as needed Dx 496, Disp: 120 mL, Rfl: 6 .  aspirin EC 81 MG tablet, Take 1 tablet (81 mg total) by mouth daily., Disp: , Rfl:  .  atorvastatin (LIPITOR) 40 MG tablet, Take 1 tablet (40 mg total) by mouth daily., Disp: 90 tablet, Rfl: 3 .  bisoprolol (ZEBETA) 5 MG tablet, Take 1 tablet (5 mg total) by mouth daily., Disp: 30 tablet, Rfl: 11 .  budesonide-formoterol (SYMBICORT) 160-4.5 MCG/ACT inhaler, Inhale 2 puffs into the lungs 2 (two) times daily., Disp: 1 Inhaler, Rfl: 11 .  Carboxymethylcellul-Glycerin (LUBRICATING EYE DROPS OP), Apply 1 drop to eye daily as needed (dry eyes)., Disp: , Rfl:  .  Cholecalciferol (VITAMIN D3) 2000 units capsule, Take  2,000 Units by mouth daily. , Disp: , Rfl:  .  clopidogrel (PLAVIX) 75 MG tablet, Take 1 tablet (75 mg total) by mouth daily with breakfast., Disp: 30 tablet, Rfl: 11 .  etanercept (ENBREL SURECLICK) 50 MG/ML injection, Inject 50 mg into the skin once a week., Disp: , Rfl:  .  fluticasone (FLONASE) 50 MCG/ACT nasal spray, USE 2 SPRAYS IN EACH NOSTRIL DAILY, Disp: 16 g, Rfl: 2 .  furosemide (LASIX) 40 MG tablet, TK 1 T PO D PRN FOR FLUID OR EDEMA, Disp: , Rfl: 11 .  gabapentin (NEURONTIN) 800 MG tablet, Take 800  mg by mouth 2 (two) times daily.  , Disp: , Rfl:  .  insulin aspart (NOVOLOG) 100 UNIT/ML injection, Inject 6-8 Units into the skin daily with supper. Sliding scale, Disp: , Rfl:  .  insulin glargine (LANTUS) 100 UNIT/ML injection, Inject 0.55 mLs (55 Units total) into the skin at bedtime., Disp: 40 mL, Rfl: 0 .  leflunomide (ARAVA) 20 MG tablet, Take 20 mg by mouth daily. , Disp: , Rfl:  .  levothyroxine (SYNTHROID, LEVOTHROID) 200 MCG tablet, Take 200 mcg by mouth daily before breakfast., Disp: , Rfl:  .  Liraglutide (VICTOZA) 18 MG/3ML SOPN, Inject 1.2 mg into the skin at bedtime. , Disp: , Rfl:  .  losartan (COZAAR) 50 MG tablet, Take 1 tablet (50 mg total) by mouth daily., Disp: 30 tablet, Rfl: 0 .  metFORMIN (GLUCOPHAGE) 1000 MG tablet, Take 1,000 mg by mouth 2 (two) times daily with a meal., Disp: , Rfl:  .  Multiple Vitamins-Minerals (MEGA MULTIVITAMIN FOR MEN PO), Take 1 tablet by mouth daily. , Disp: , Rfl:  .  OXYGEN, Inhale 2 L into the lungs continuous., Disp: , Rfl:  .  sertraline (ZOLOFT) 100 MG tablet, Take 50 mg by mouth daily. , Disp: , Rfl:  .  spironolactone (ALDACTONE) 25 MG tablet, Take 0.5 tablets (12.5 mg total) by mouth daily., Disp: 90 tablet, Rfl: 3 .  tamsulosin (FLOMAX) 0.4 MG CAPS capsule, Take 2 capsules (0.8 mg total) by mouth at bedtime., Disp: 30 capsule, Rfl: 0 .  tiotropium (SPIRIVA HANDIHALER) 18 MCG inhalation capsule, Place 18 mcg into inhaler and inhale daily.  , Disp: , Rfl:   Objective: BP (!) 148/72 (BP Location: Left Arm, Patient Position: Sitting, Cuff Size: Normal)   Pulse (!) 54   Temp 97.8 F (36.6 C) (Oral)   Ht 6\' 2"  (1.88 m)   Wt 224 lb (101.6 kg)   SpO2 93%   BMI 28.76 kg/m   General: Awake, appears stated age, [place] O2 supplementation HEENT: MMM, EOMi Heart: RRR, no murmurs, no LE edema Lungs: CTAB, no rales, wheezes or rhonchi. No accessory muscle use  Abd: BS+, soft, NT, ND, no masses or organomegaly Neuro: 1/4 calcaneal reflex, 2/4  patellar reflex b/l, sensation intact to light touch Msk: 5/5 strength b/l, gait normal when I saw him walk Psych: Age appropriate judgment and insight, normal affect and mood  Assessment and Plan: Loose stools - Plan: Gliadin antibodies, serum, Tissue transglutaminase, IgA, Reticulin Antibody, IgA w reflex titer, Stool Culture, Ova and parasite examination, CBC w/Diff, Comprehensive metabolic panel  Unsteady gait  Orders as above.  Metamucil to add bulk. If no improvement, will refer to GI. Discussed PT or trying to decrease dose of Neurontin. He would like to continue as is and let know if anything changes. F/u in 4 weeks to recheck stools.  The patient voiced understanding and agreement  to the plan.  Jilda Roche [place], DO 01/29/17  11:42 AM

## 2017-02-03 LAB — RETICULIN ANTIBODIES, IGA W TITER: Reticulin Ab, IgA: NEGATIVE

## 2017-02-03 LAB — GLIADIN ANTIBODIES, SERUM
Gliadin IgA: 2 Units (ref <20)
Gliadin IgG: 2 Units (ref <20)

## 2017-02-03 LAB — TISSUE TRANSGLUTAMINASE, IGA: TISSUE TRANSGLUTAMINASE AB, IGA: 1 U/mL (ref <4)

## 2017-02-05 ENCOUNTER — Encounter: Payer: Self-pay | Admitting: Adult Health

## 2017-02-05 ENCOUNTER — Ambulatory Visit (INDEPENDENT_AMBULATORY_CARE_PROVIDER_SITE_OTHER): Payer: Medicare Other | Admitting: Adult Health

## 2017-02-05 ENCOUNTER — Ambulatory Visit (HOSPITAL_BASED_OUTPATIENT_CLINIC_OR_DEPARTMENT_OTHER)
Admission: RE | Admit: 2017-02-05 | Discharge: 2017-02-05 | Disposition: A | Discharge status: Home / Self Care | Payer: Medicare Other | Source: Ambulatory Visit | Attending: Adult Health | Admitting: Adult Health

## 2017-02-05 VITALS — BP 124/74 | HR 53 | Ht 74.0 in | Wt 222.0 lb

## 2017-02-05 DIAGNOSIS — J189 Pneumonia, unspecified organism: Secondary | ICD-10-CM

## 2017-02-05 DIAGNOSIS — J449 Chronic obstructive pulmonary disease, unspecified: Secondary | ICD-10-CM

## 2017-02-05 DIAGNOSIS — J181 Lobar pneumonia, unspecified organism: Secondary | ICD-10-CM

## 2017-02-05 DIAGNOSIS — J9611 Chronic respiratory failure with hypoxia: Secondary | ICD-10-CM

## 2017-02-05 DIAGNOSIS — R918 Other nonspecific abnormal finding of lung field: Secondary | ICD-10-CM | POA: Insufficient documentation

## 2017-02-05 NOTE — Progress Notes (Signed)
Called and spoke to Pt about the results from his CXR. Told Pt to keep the appt. He has on 02/26/17 with TP at the [facility] office but to return about 20 min before appt to have a cxr. Pt expressed understanding. Nothing further needed at this time.

## 2017-02-05 NOTE — Assessment & Plan Note (Signed)
LUL PNA clinically improving after abx  Pt is immunosuppressed (on ENBREL)  CT chest does show masslike consolidation , w/ recent illness will follow closely for clearance  Previous cx were neg.  Check cxr today , if not cleared repeat in 3-4 weeks . Will need CT chest ~6-8 weeks as well.

## 2017-02-05 NOTE — Assessment & Plan Note (Signed)
Cont on O2 .  

## 2017-02-05 NOTE — Assessment & Plan Note (Signed)
Recent flare w/ PNA -improving  Cont on current regimen   Plan  Patient Instructions  Continue on Symbicort 160 2 puffs Twice daily  , rinse after use.  Continue on Spiriva daily.  Continue on Oxygen 2l/m .  Continue on CPAP At bedtime   Chest xray today .  Follow up with in 3-4 weeks  and As needed   Please contact office for sooner follow up if symptoms do not improve or worsen or seek emergency care

## 2017-02-05 NOTE — Patient Instructions (Signed)
Continue on Symbicort 160 2 puffs Twice daily  , rinse after use.  Continue on Spiriva daily.  Continue on Oxygen 2l/m .  Continue on CPAP At bedtime   Chest xray today .  Follow up with in 3-4 weeks  and As needed   Please contact office for sooner follow up if symptoms do not improve or worsen or seek emergency care

## 2017-02-05 NOTE — Progress Notes (Signed)
@Patient  ID: [name], male    DOB: 02/10/1941, 76 y.o.   MRN: [id number]  Chief Complaint  Patient presents with  . 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  PFT 10/2015 FEV1 was 36%, ratio 46, FVC 58%, DLCO 26%  02/05/2017 Follow up : COPD , PNA , post hospital follow up  Pt returns for follow up . Recent hospitalization last month with acute illness, dx with LUL pneumonia . Tx w/ IV abx and discharged on Augmentin . He is feeling better. Cough and congestion are less. No fever or n/v/d.  Recent CT chest reviwd independently and showed 4.5cm masslike consolidation in LUL .   Has severe COPD . Changed from Asmanex to Symbicort last ov. Says he likes it , not sure if it has made much difference. Still gets winded easily .  Remains on Spiriva . On O2 at 2l/m .  Has RA on Enbrel . Was off enbrel for almost 6 weeks .   Remains on CPAP At bedtime  . Download shows excellent compliance with avg usage >8 hr . AHI 0.6.     No Known Allergies  Immunization History  Administered Date(s) Administered  . Influenza Split 04/14/2011, 04/27/2013  . Influenza Whole 05/28/2009, 04/27/2010, 03/31/2012  . Influenza, High Dose Seasonal PF 03/21/2015  . Influenza,inj,Quad PF,36+ Mos 03/17/2014, 04/04/2016  . Influenza-Unspecified 05/26/2011, 05/07/2012  . Pneumococcal Conjugate-13 09/20/2013  . Pneumococcal Polysaccharide-23 05/28/2008, 04/25/2010  . Pneumococcal-Unspecified 04/27/2013  . Td 11/23/2009  . Zoster 04/28/2009    Past Medical History:  Diagnosis Date  . Anxiety   . BENIGN PROSTATIC HYPERTROPHY 11/23/2009  . Cardiomyopathy (HCC) 08/28/2016  . COPD (chronic obstructive pulmonary disease)  (HCC)   . CORONARY ARTERY DISEASE 11/23/2009  . 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 DEGENERATIVE JOINT DISEASE 11/23/2009  . DEPRESSION 11/23/2009  . FATIGUE 11/23/2009  . GAIT DISTURBANCE 12/10/2009  . HEMOPTYSIS UNSPECIFIED 05/07/2010  . High cholesterol   . HYPERTENSION 07/30/2009  . HYPOTHYROIDISM 07/30/2009  . 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 On home oxygen therapy    "2-3L; 24/7" (09/10/2016)  . OSA on CPAP   . PTSD (post-traumatic stress disorder) 03/10/2012  . 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 RA (rheumatoid arthritis) (HCC) 06/11/2011   "qwhere" (09/10/2016)  . RESPIRATORY FAILURE, CHRONIC 07/31/2009  . Scleritis of both eyes 03/17/2014  . 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 Type II diabetes mellitus (HCC)     Tobacco History: History  Smoking Status  . Former Smoker  . Packs/day: 2.50  . Years: 40.00  . Types: Cigarettes, Pipe, Cigars  . Quit date: 07/28/1998  Smokeless Tobacco  . Never Used   Counseling given: Not Answered   Outpatient Encounter Prescriptions as of 02/05/2017  Medication Sig  . acetaminophen (TYLENOL) 325 MG tablet Take 325-650 mg by mouth daily as needed for moderate pain or headache.  . albuterol (PROAIR HFA) 108 (90 BASE) MCG/ACT inhaler Inhale 2 puffs into the lungs every 4 (four) hours as needed. Shortness of breath  . albuterol (PROVENTIL) (2.5 MG/3ML) 0.083% nebulizer solution 1 vial in nebulizer every 6 hours as needed Dx 496  . aspirin EC 81 MG tablet Take 1 tablet (81 mg  total) by mouth daily.  [name] atorvastatin (LIPITOR) 40 MG tablet Take 1 tablet (40 mg total) by mouth daily.  . bisoprolol (ZEBETA) 5 MG tablet Take 1 tablet (5 mg total) by mouth daily.  . budesonide-formoterol (SYMBICORT) 160-4.5 MCG/ACT inhaler Inhale 2 puffs into the lungs 2 (two) times daily.  . Carboxymethylcellul-Glycerin (LUBRICATING EYE DROPS OP) Apply 1 drop to eye daily as needed (dry eyes).  . Cholecalciferol (VITAMIN D3) 2000 units capsule Take 2,000 Units by mouth daily.     . clopidogrel (PLAVIX) 75 MG tablet Take 1 tablet (75 mg total) by mouth daily with breakfast.  . etanercept (ENBREL SURECLICK) 50 MG/ML injection Inject 50 mg into the skin once a week.  . fluticasone (FLONASE) 50 MCG/ACT nasal spray USE 2 SPRAYS IN EACH NOSTRIL DAILY  . furosemide (LASIX) 40 MG tablet TK 1 T PO D PRN FOR FLUID OR EDEMA  . gabapentin (NEURONTIN) 800 MG tablet Take 800 mg by mouth 2 (two) times daily.    . insulin aspart (NOVOLOG) 100 UNIT/ML injection Inject 6-8 Units into the skin daily with supper. Sliding scale  . insulin glargine (LANTUS) 100 UNIT/ML injection Inject 0.55 mLs (55 Units total) into the skin at bedtime.  [name] leflunomide (ARAVA) 20 MG tablet Take 20 mg by mouth daily.   [name] levothyroxine (SYNTHROID, LEVOTHROID) 200 MCG tablet Take 200 mcg by mouth daily before breakfast.  . Liraglutide (VICTOZA) 18 MG/3ML SOPN Inject 1.2 mg into the skin at bedtime.   [name] losartan (COZAAR) 50 MG tablet Take 1 tablet (50 mg total) by mouth daily.  . metFORMIN (GLUCOPHAGE) 1000 MG tablet Take 1,000 mg by mouth 2 (two) times daily with a meal.  . Multiple Vitamins-Minerals (MEGA MULTIVITAMIN FOR MEN PO) Take 1 tablet by mouth daily.   . OXYGEN Inhale 2 L into the lungs continuous.  . sertraline (ZOLOFT) 100 MG tablet Take 50 mg by mouth daily.   [name] spironolactone (ALDACTONE) 25 MG tablet Take 0.5 tablets (12.5 mg total) by mouth daily.  . tamsulosin (FLOMAX) 0.4 MG CAPS capsule Take 2 capsules (0.8 mg total) by mouth at bedtime.  [name] tiotropium (SPIRIVA HANDIHALER) 18 MCG inhalation capsule Place 18 mcg into inhaler and inhale daily.     No facility-administered encounter medications on file as of 02/05/2017.      Review of Systems  Constitutional:   No  weight loss, night sweats,  Fevers, chills, + fatigue, or  lassitude.  HEENT:   No headaches,  Difficulty swallowing,  Tooth/dental problems, or  Sore throat,                No sneezing, itching, ear ache, nasal congestion, post  nasal drip,   CV:  No chest pain,  Orthopnea, PND, swelling in lower extremities, anasarca, dizziness, palpitations, syncope.   GI  No heartburn, indigestion, abdominal pain, nausea, vomiting, diarrhea, change in bowel habits, loss of appetite, bloody stools.   Resp:    No chest wall deformity  Skin: no rash or lesions.  GU: no dysuria, change in color of urine, no urgency or frequency.  No flank pain, no hematuria   MS:  No joint pain or swelling.  No decreased range of motion.  No back pain.    Physical Exam  BP 124/74 (BP Location: Left Arm, Patient Position: Sitting, Cuff Size: Normal)   Pulse (!) 53   Ht 6\' 2"  (1.88 m)   Wt 222 lb (100.7 kg)   SpO2 96%   BMI  28.50 kg/m   GEN: A/Ox3; pleasant , NAD, elderly , on O2    HEENT:  [place]/AT,  EACs-clear, TMs-wnl, NOSE-clear, THROAT-clear, no lesions, no postnasal drip or exudate noted.   NECK:  Supple w/ fair ROM; no JVD; normal carotid impulses w/o bruits; no thyromegaly or nodules palpated; no lymphadenopathy.    RESP  Decreased BS in bases ,  no accessory muscle use, no dullness to percussion  CARD:  RRR, no m/r/g, no peripheral edema, pulses intact, no cyanosis or clubbing.  GI:   Soft & nt; nml bowel sounds; no organomegaly or masses detected.   Musco: Warm bil, no deformities or joint swelling noted.   Neuro: alert, no focal deficits noted.    Skin: Warm, no lesions or rashes     Lab Results:  CBC    Imaging: Ct Angio Chest Aorta W &/or Wo Contrast  Result Date: 01/22/2017 CLINICAL DATA:  Thoracic aortic aneurysm. EXAM: CT ANGIOGRAPHY CHEST, ABDOMEN AND PELVIS TECHNIQUE: Multidetector CT imaging through the chest, abdomen and pelvis was performed using the standard protocol during bolus administration of intravenous contrast. Multiplanar reconstructed images and MIPs were obtained and reviewed to evaluate the vascular anatomy. CONTRAST:  100 cc Isovue 370 COMPARISON:  10/09/2015.  07/19/2013 FINDINGS: CTA CHEST  FINDINGS Cardiovascular: Focal aneurysmal dilatation of the distal aortic arch, just above the ductus diverticulum measures 3.7 x 3.9 cm. These measurements are not significantly changed based on my direct measurements of the prior study. There is no evidence of aortic dissection or intramural hematoma. Atherosclerotic calcifications are present within the arch. There is mild irregular plaque in the descending thoracic aorta. Great vessels are patent within the confines of the examination the left vertebral artery is diminutive and patent. Right vertebral artery is patent. There is no obvious pulmonary thromboembolism. The heart is normal in size. Minimal 3 vessel coronary artery calcification. Mediastinum/Nodes: Several small mediastinal nodes are present. None are greater than 1 cm in short axis diameter, but there large number does raise suspicion for an infectious or inflammatory process. No pericardial effusion. Thyroid is atrophic. Esophagus is normal. Lungs/Pleura: Extensive pleural calcifications in the right hemithorax are stable. Is primarily anterior and posterior with relative sparing over the right hemidiaphragm. A small left pleural effusion has developed. There is a background of severe emphysema. An irregular area of masslike consolidation has developed at the anterior left upper lobe since the prior study. Differential diagnosis includes malignancy and mycobacterium infection. This abnormality measures 4.5 by 3.1 cm. It extends to the pleural surface. Musculoskeletal: No vertebral compression deformity. Review of the MIP images confirms the above findings. CTA ABDOMEN AND PELVIS FINDINGS VASCULAR Aorta: No evidence of dissection. 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 Aorto bi-iliac bypass graft is noted and widely patent. Celiac: Patent.  Branch vessels patent. SMA: Patent. Renals: Single renal arteries are patent. Aneurysmal dilatation at the origin of the  left renal artery measures 2.1 cm. This has not significantly changed compared to the prior study. IMA: IMA origin was sacrificed.  Branches reconstitute. Inflow: Distal native right common iliac artery is patent. Right external iliac artery is patent. 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 This is not significantly changed. Left common iliac artery is patent with scattered atherosclerotic changes. Left external iliac artery is patent. Left internal iliac artery origin is occluded. Branches reconstitute. Right and left common femoral artery maximal diameters are 1.8 and 1.6 cm respectively. This is not  significantly changed compared to the prior study. Review of the MIP images confirms the above findings. NON-VASCULAR Hepatobiliary: Several layering gallstones are noted. Unremarkable appearance of the liver. Pancreas: Unremarkable Spleen: Unremarkable Adrenals/Urinary Tract: Adrenal glands are unremarkable. Small hypodensities in the kidneys are not significantly changed. Stomach/Bowel: Stomach is within normal limits. Duodenum is unremarkable. No evidence of small-bowel obstruction. Normal appendix. No obvious mass in the colon. Diverticulosis of the sigmoid colon is present. There is nonspecific wall thickening of the rectum. Lymphatic: There is no evidence of abnormal retroperitoneal adenopathy. Several small para-aortic lymph nodes throughout the retroperitoneum are not significantly changed. Reproductive: Prostate is unremarkable. Other: There is no free fluid. Musculoskeletal: There is no lumbar vertebral compression deformity. Multilevel degenerative disc disease in the lumbar spine is present. Is grossly stable. Review of the MIP images confirms the above findings. 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 No evidence of dissection. 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 Nonvascular: New masslike pulmonary opacity at the left apex as described. There are also increased subcentimeter short axis diameter mediastinal nodes. Malignancy is not excluded. PET-CT is recommended. Left pleural effusion. Stable cholelithiasis. Electronically Signed   By: Jolaine Click M.D.   On: 01/22/2017 10:04   Ct Angio Abd/pel W/ And/or W/o  Result Date: 01/22/2017 CLINICAL DATA:  Thoracic aortic aneurysm. EXAM: CT ANGIOGRAPHY CHEST, ABDOMEN AND PELVIS TECHNIQUE: Multidetector CT imaging through the chest, abdomen and pelvis was performed using the standard protocol during bolus administration of intravenous contrast. Multiplanar reconstructed images and MIPs were obtained and reviewed to evaluate the vascular anatomy. CONTRAST:  100 cc Isovue 370 COMPARISON:  10/09/2015.  07/19/2013 FINDINGS: CTA CHEST FINDINGS Cardiovascular: Focal aneurysmal dilatation of the distal aortic arch, just above the ductus diverticulum measures 3.7 x 3.9 cm. These measurements are not significantly changed based on my direct measurements of the prior study. There is no evidence of aortic dissection or intramural hematoma. Atherosclerotic calcifications are present within the arch. There is mild irregular plaque in the descending thoracic aorta. Great vessels are patent within the confines of the examination the left vertebral artery is diminutive and patent. Right vertebral artery is patent. There is no obvious pulmonary thromboembolism. The heart is normal in size. Minimal 3 vessel coronary artery calcification. Mediastinum/Nodes: Several small mediastinal nodes are present. None are greater than 1 cm in short axis diameter, but there large number does raise suspicion for an infectious or inflammatory process. No pericardial  effusion. Thyroid is atrophic. Esophagus is normal. Lungs/Pleura: Extensive pleural calcifications in the right hemithorax are stable. Is primarily anterior and posterior with relative sparing over the right hemidiaphragm. A small left pleural effusion has developed. There is a background of severe emphysema. An irregular area of masslike consolidation has developed at the anterior left upper lobe since the prior study. Differential diagnosis includes malignancy and mycobacterium infection. This abnormality measures 4.5 by 3.1 cm. It extends to the pleural surface. Musculoskeletal: No vertebral compression deformity. Review of the MIP images confirms the above findings. CTA ABDOMEN AND PELVIS FINDINGS VASCULAR Aorta: No evidence of dissection. 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 Aorto bi-iliac bypass  graft is noted and widely patent. Celiac: Patent.  Branch vessels patent. SMA: Patent. Renals: Single renal arteries are patent. Aneurysmal dilatation at the origin of the left renal artery measures 2.1 cm. This has not significantly changed compared to the prior study. IMA: IMA origin was sacrificed.  Branches reconstitute. Inflow: Distal native right common iliac artery is patent. Right external iliac artery is patent. 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 This is not significantly changed. Left common iliac artery is patent with scattered atherosclerotic changes. Left external iliac artery is patent. Left internal iliac artery origin is occluded. Branches reconstitute. Right and left common femoral artery maximal diameters are 1.8 and 1.6 cm respectively. This is not significantly changed compared to the prior study. Review of the MIP images confirms the above findings. NON-VASCULAR Hepatobiliary: Several layering gallstones are noted. Unremarkable appearance of the liver. Pancreas: Unremarkable Spleen: Unremarkable Adrenals/Urinary Tract: Adrenal glands  are unremarkable. Small hypodensities in the kidneys are not significantly changed. Stomach/Bowel: Stomach is within normal limits. Duodenum is unremarkable. No evidence of small-bowel obstruction. Normal appendix. No obvious mass in the colon. Diverticulosis of the sigmoid colon is present. There is nonspecific wall thickening of the rectum. Lymphatic: There is no evidence of abnormal retroperitoneal adenopathy. Several small para-aortic lymph nodes throughout the retroperitoneum are not significantly changed. Reproductive: Prostate is unremarkable. Other: There is no free fluid. Musculoskeletal: There is no lumbar vertebral compression deformity. Multilevel degenerative disc disease in the lumbar spine is present. Is grossly stable. Review of the MIP images confirms the above findings. 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 No evidence of dissection. 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 Nonvascular: New masslike pulmonary opacity at the left apex as described. There are also increased subcentimeter short axis diameter mediastinal nodes. Malignancy is not excluded. PET-CT is recommended. Left pleural effusion. Stable cholelithiasis. Electronically Signed   By: Jolaine Click M.D.   On: 01/22/2017 10:04     Assessment & Plan:   COPD (chronic obstructive pulmonary disease) (HCC) Recent flare w/ PNA -improving  Cont on current regimen   Plan  Patient Instructions  Continue on Symbicort 160 2 puffs Twice daily  , rinse after use.  Continue on Spiriva daily.  Continue on Oxygen 2l/m .  Continue on CPAP At bedtime   Chest xray today .  Follow up with in 3-4 weeks  and As needed   Please  contact office for sooner follow up if symptoms do not improve or worsen or seek emergency care      RESPIRATORY FAILURE, CHRONIC Cont on O2   CAP (community acquired pneumonia) LUL PNA clinically improving after abx  Pt is immunosuppressed (on ENBREL)  CT chest does show masslike consolidation , w/ recent illness will follow closely for clearance  Previous cx were neg.  Check cxr today , if not cleared repeat in 3-4 weeks . Will need CT chest ~6-8 weeks as well.       Rubye Oaks, NP 02/05/2017

## 2017-02-06 NOTE — Progress Notes (Signed)
Reviewed & agree with plan  

## 2017-02-12 ENCOUNTER — Ambulatory Visit: Payer: Medicare Other | Admitting: Acute Care

## 2017-02-19 ENCOUNTER — Ambulatory Visit: Payer: Medicare Other | Admitting: Adult Health

## 2017-02-26 ENCOUNTER — Ambulatory Visit (HOSPITAL_BASED_OUTPATIENT_CLINIC_OR_DEPARTMENT_OTHER)
Admission: RE | Admit: 2017-02-26 | Discharge: 2017-02-26 | Disposition: A | Discharge status: Home / Self Care | Payer: Medicare Other | Source: Ambulatory Visit | Attending: Acute Care | Admitting: Acute Care

## 2017-02-26 ENCOUNTER — Ambulatory Visit (INDEPENDENT_AMBULATORY_CARE_PROVIDER_SITE_OTHER): Payer: Medicare Other | Admitting: Adult Health

## 2017-02-26 ENCOUNTER — Encounter: Payer: Self-pay | Admitting: Family Medicine

## 2017-02-26 ENCOUNTER — Ambulatory Visit (INDEPENDENT_AMBULATORY_CARE_PROVIDER_SITE_OTHER): Payer: Medicare Other | Admitting: Family Medicine

## 2017-02-26 ENCOUNTER — Encounter: Payer: Self-pay | Admitting: Adult Health

## 2017-02-26 VITALS — BP 124/66 | HR 65 | Temp 97.9°F | Ht 74.0 in | Wt 223.2 lb

## 2017-02-26 VITALS — BP 134/84 | HR 55 | Ht 74.0 in | Wt 223.0 lb

## 2017-02-26 DIAGNOSIS — J181 Lobar pneumonia, unspecified organism: Secondary | ICD-10-CM

## 2017-02-26 DIAGNOSIS — J9611 Chronic respiratory failure with hypoxia: Secondary | ICD-10-CM

## 2017-02-26 DIAGNOSIS — D649 Anemia, unspecified: Secondary | ICD-10-CM

## 2017-02-26 DIAGNOSIS — R918 Other nonspecific abnormal finding of lung field: Secondary | ICD-10-CM | POA: Diagnosis not present

## 2017-02-26 DIAGNOSIS — J449 Chronic obstructive pulmonary disease, unspecified: Secondary | ICD-10-CM

## 2017-02-26 DIAGNOSIS — Z9989 Dependence on other enabling machines and devices: Secondary | ICD-10-CM | POA: Diagnosis not present

## 2017-02-26 DIAGNOSIS — J189 Pneumonia, unspecified organism: Secondary | ICD-10-CM

## 2017-02-26 DIAGNOSIS — Z09 Encounter for follow-up examination after completed treatment for conditions other than malignant neoplasm: Secondary | ICD-10-CM | POA: Diagnosis not present

## 2017-02-26 DIAGNOSIS — J690 Pneumonitis due to inhalation of food and vomit: Secondary | ICD-10-CM | POA: Diagnosis present

## 2017-02-26 DIAGNOSIS — J90 Pleural effusion, not elsewhere classified: Secondary | ICD-10-CM | POA: Insufficient documentation

## 2017-02-26 DIAGNOSIS — G4733 Obstructive sleep apnea (adult) (pediatric): Secondary | ICD-10-CM

## 2017-02-26 DIAGNOSIS — R911 Solitary pulmonary nodule: Secondary | ICD-10-CM | POA: Diagnosis not present

## 2017-02-26 NOTE — Assessment & Plan Note (Signed)
LUL PNA tx in June with abx.  Clinically improved with lingering masslike consolidation in LUL . CXR does show some gradual improvement . Previous cxr in April w/ no left sided mass on plain film.  Will need to follow very closely  Will check CT chest in 2 weeks, pending these results may need PET -but will hold off for now .  As may be xray lag time.

## 2017-02-26 NOTE — Patient Instructions (Addendum)
Set up CT chest in 2 weeks .  Continue on Symbicort 160 2 puffs Twice daily  , rinse after use.  Continue on Spiriva daily.  Continue on Oxygen 2l/m .  Continue on CPAP At bedtime   Follow up with in 6-8 weeks with Dr. Vassie Loll  .  Please contact office for sooner follow up if symptoms do not improve or worsen or seek emergency care

## 2017-02-26 NOTE — Patient Instructions (Signed)
If the card you bring back is positive, we will be sending you to a specialist. If it is negative, we will monitor your blood count.  Let us know if you need anything.

## 2017-02-26 NOTE — Progress Notes (Signed)
Chief Complaint  Patient presents with  . Follow-up    F/U on loose stools.  Patient states that they are now normal.   Subjective: Patient is a 76 y.o. male here for f/u stools.  Since he started taking metamucil, things have been going better. He did not turn in the stool culture because his issue had resolved. No bleeding or tarry stools. He is not having pain.   Pt was found to be anemic on labs. Last colonoscopy was around 10 years ago. He would prefer not to do this again if possible.   ROS: GI: As noted in HPI  Family History  Problem Relation Age of Onset  . Other Mother        gun shot   Past Medical History:  Diagnosis Date  . Anxiety   . BENIGN PROSTATIC HYPERTROPHY 11/23/2009  . Cardiomyopathy (HCC) 08/28/2016  . COPD (chronic obstructive pulmonary disease) (HCC)   . CORONARY ARTERY DISEASE 11/23/2009  . 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 DEGENERATIVE JOINT DISEASE 11/23/2009  . DEPRESSION 11/23/2009  . FATIGUE 11/23/2009  . GAIT DISTURBANCE 12/10/2009  . HEMOPTYSIS UNSPECIFIED 05/07/2010  . High cholesterol   . HYPERTENSION 07/30/2009  . HYPOTHYROIDISM 07/30/2009  . 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 On home oxygen therapy    "2-3L; 24/7" (09/10/2016)  . OSA on CPAP   . PTSD (post-traumatic stress disorder) 03/10/2012  . 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 RA (rheumatoid arthritis) (HCC) 06/11/2011   "qwhere" (09/10/2016)  . RESPIRATORY FAILURE, CHRONIC 07/31/2009  . Scleritis of both eyes 03/17/2014  . 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 Type II diabetes mellitus (HCC)    No Known Allergies  Current Outpatient Prescriptions:  .  acetaminophen (TYLENOL) 325 MG tablet, Take 325-650 mg by mouth daily as needed for moderate pain or headache., Disp: , Rfl:  .  albuterol (PROAIR HFA) 108 (90 BASE) MCG/ACT inhaler, Inhale 2 puffs into the lungs every 4 (four) hours as needed. Shortness of breath, Disp: 1 Inhaler, Rfl: 6 .  albuterol (PROVENTIL) (2.5 MG/3ML) 0.083% nebulizer  solution, 1 vial in nebulizer every 6 hours as needed Dx 496, Disp: 120 mL, Rfl: 6 .  aspirin EC 81 MG tablet, Take 1 tablet (81 mg total) by mouth daily., Disp: , Rfl:  .  atorvastatin (LIPITOR) 40 MG tablet, Take 1 tablet (40 mg total) by mouth daily., Disp: 90 tablet, Rfl: 3 .  bisoprolol (ZEBETA) 5 MG tablet, Take 1 tablet (5 mg total) by mouth daily., Disp: 30 tablet, Rfl: 11 .  budesonide-formoterol (SYMBICORT) 160-4.5 MCG/ACT inhaler, Inhale 2 puffs into the lungs 2 (two) times daily., Disp: 1 Inhaler, Rfl: 11 .  Carboxymethylcellul-Glycerin (LUBRICATING EYE DROPS OP), Apply 1 drop to eye daily as needed (dry eyes)., Disp: , Rfl:  .  Cholecalciferol (VITAMIN D3) 2000 units capsule, Take 2,000 Units by mouth daily. , Disp: , Rfl:  .  clopidogrel (PLAVIX) 75 MG tablet, Take 1 tablet (75 mg total) by mouth daily with breakfast., Disp: 30 tablet, Rfl: 11 .  etanercept (ENBREL SURECLICK) 50 MG/ML injection, Inject 50 mg into the skin once a week., Disp: , Rfl:  .  fluticasone (FLONASE) 50 MCG/ACT nasal spray, USE 2 SPRAYS IN EACH NOSTRIL DAILY, Disp: 16 g, Rfl: 2 .  furosemide (LASIX) 40 MG tablet, TK 1 T PO D PRN FOR FLUID OR EDEMA, Disp: , Rfl: 11 .  gabapentin (NEURONTIN) 800 MG tablet, Take 800 mg by mouth 2 (two) times daily.  , Disp: ,  Rfl:  .  insulin aspart (NOVOLOG) 100 UNIT/ML injection, Inject 6-8 Units into the skin daily with supper. Sliding scale, Disp: , Rfl:  .  insulin glargine (LANTUS) 100 UNIT/ML injection, Inject 0.55 mLs (55 Units total) into the skin at bedtime., Disp: 40 mL, Rfl: 0 .  leflunomide (ARAVA) 20 MG tablet, Take 20 mg by mouth daily. , Disp: , Rfl:  .  levothyroxine (SYNTHROID, LEVOTHROID) 200 MCG tablet, Take 200 mcg by mouth daily before breakfast., Disp: , Rfl:  .  Liraglutide (VICTOZA) 18 MG/3ML SOPN, Inject 1.2 mg into the skin at bedtime. , Disp: , Rfl:  .  losartan (COZAAR) 50 MG tablet, Take 1 tablet (50 mg total) by mouth daily., Disp: 30 tablet, Rfl:  0 .  metFORMIN (GLUCOPHAGE) 1000 MG tablet, Take 1,000 mg by mouth 2 (two) times daily with a meal., Disp: , Rfl:  .  Multiple Vitamins-Minerals (MEGA MULTIVITAMIN FOR MEN PO), Take 1 tablet by mouth daily. , Disp: , Rfl:  .  OXYGEN, Inhale 2 L into the lungs continuous., Disp: , Rfl:  .  sertraline (ZOLOFT) 100 MG tablet, Take 50 mg by mouth daily. , Disp: , Rfl:  .  spironolactone (ALDACTONE) 25 MG tablet, Take 0.5 tablets (12.5 mg total) by mouth daily., Disp: 90 tablet, Rfl: 3 .  tamsulosin (FLOMAX) 0.4 MG CAPS capsule, Take 2 capsules (0.8 mg total) by mouth at bedtime., Disp: 30 capsule, Rfl: 0 .  tiotropium (SPIRIVA HANDIHALER) 18 MCG inhalation capsule, Place 18 mcg into inhaler and inhale daily.  , Disp: , Rfl:   Objective: BP 124/66 (BP Location: Right Arm, Patient Position: Sitting, Cuff Size: Large)   Pulse 65   Temp 97.9 F (36.6 C) (Oral)   Ht 6\' 2"  (1.88 m)   Wt 223 lb 3.2 oz (101.2 kg)   SpO2 90% Comment: On 2L  BMI 28.66 kg/m  General: Awake, appears stated age HEENT: MMM, EOMi Heart: RRR Lungs: CTAB, no rales, wheezes or rhonchi. No accessory muscle use Abd: BS+, soft, NT, ND, no masses or organomegaly Psych: Age appropriate judgment and insight, normal affect and mood  Assessment and Plan: Follow-up for resolved condition  Anemia, unspecified type - Plan: Fecal occult blood, imunochemical  Cont Metamucil prn. Let know if anything arises.  For anemia and ccs, will order FIT testing. Cards for fit testing given to patient. Recommended that if this is positive, will have to refer to GI.  F/u prn. The patient voiced understanding and agreement to the plan.  [place], DO 02/26/17  10:04 AM

## 2017-02-26 NOTE — Assessment & Plan Note (Signed)
Con on O2

## 2017-02-26 NOTE — Assessment & Plan Note (Signed)
Compensated on present regimen   Plan  Patient Instructions  Set up CT chest in 2 weeks .  Continue on Symbicort 160 2 puffs Twice daily  , rinse after use.  Continue on Spiriva daily.  Continue on Oxygen 2l/m .  Continue on CPAP At bedtime   Follow up with in 6-8 weeks with Dr. Vassie Loll  .  Please contact office for sooner follow up if symptoms do not improve or worsen or seek emergency care

## 2017-02-26 NOTE — Progress Notes (Signed)
@Patient  ID: [name], male    DOB: [birth year], 76 y.o.   MRN: [id number]  Chief Complaint  Patient presents with  . 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  PFT 10/2015 FEV1 was 36%, ratio 46, FVC 58%, DLCO 26%   02/26/2017 Follow up : COPD , PNA  Patient returns for a one-month follow-up. Patient was seen last visit after hospitalization for a left upper lobe pneumonia. Patient is starting to feel better with decreased cough and congestion. During hospitalization. CT chest showed a 4. centimeter masslike consolidation in the left upper lobe. Chest x-ray done 2 weeks ago showed a significant improvement in the left upper lobe pneumonia. CXR today shows persistent density in LUL -suprahilar area.  Pt says he is back to baseline . Feels okay . Eating okay w/ no no chest pain, increased edema , wt loss or hemoptyiss .  CXR in April showed chronic changes with no acute infiltrate.     PVX and Prevnar are utd.    No Known Allergies  Immunization History  Administered Date(s) Administered  . Influenza Split 04/14/2011, 04/27/2013  . Influenza Whole 05/28/2009, 04/27/2010, 03/31/2012  . Influenza, High Dose Seasonal PF 03/21/2015  . Influenza,inj,Quad PF,36+ Mos 03/17/2014, 04/04/2016  . Influenza-Unspecified 05/26/2011, 05/07/2012  . Pneumococcal Conjugate-13 09/20/2013  . Pneumococcal Polysaccharide-23 05/28/2008, 04/25/2010  . Pneumococcal-Unspecified 04/27/2013  . Td 11/23/2009  . Zoster 04/28/2009    Past Medical History:  Diagnosis Date  . Anxiety   . BENIGN PROSTATIC HYPERTROPHY 11/23/2009  . Cardiomyopathy (HCC) 08/28/2016  . COPD (chronic obstructive pulmonary disease) (HCC)     . CORONARY ARTERY DISEASE 11/23/2009  . 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 DEGENERATIVE JOINT DISEASE 11/23/2009  . DEPRESSION 11/23/2009  . FATIGUE 11/23/2009  . GAIT DISTURBANCE 12/10/2009  . HEMOPTYSIS UNSPECIFIED 05/07/2010  . High cholesterol   . HYPERTENSION 07/30/2009  . HYPOTHYROIDISM 07/30/2009  . 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 On home oxygen therapy    "2-3L; 24/7" (09/10/2016)  . OSA on CPAP   . PTSD (post-traumatic stress disorder) 03/10/2012  . 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 RA (rheumatoid arthritis) (HCC) 06/11/2011   "qwhere" (09/10/2016)  . RESPIRATORY FAILURE, CHRONIC 07/31/2009  . Scleritis of both eyes 03/17/2014  . 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 Type II diabetes mellitus (HCC)     Tobacco History: History  Smoking Status  . Former Smoker  . Packs/day: 2.50  . Years: 40.00  . Types: Cigarettes, Pipe, Cigars  . Quit date: 07/28/1998  Smokeless Tobacco  . Never Used   Counseling given: Not Answered   Outpatient Encounter Prescriptions as of 02/26/2017  Medication Sig  . acetaminophen (TYLENOL) 325 MG tablet Take 325-650 mg by mouth daily as needed for moderate pain or headache.  . albuterol (PROAIR HFA) 108 (90 BASE) MCG/ACT inhaler Inhale 2 puffs into the lungs every 4 (four) hours as needed. Shortness of breath  . albuterol (PROVENTIL) (2.5 MG/3ML) 0.083% nebulizer solution 1 vial in nebulizer every 6 hours as needed Dx 496  . aspirin EC 81 MG tablet Take 1 tablet (81 mg total) by mouth daily.  [name] atorvastatin (LIPITOR) 40 MG tablet Take 1 tablet (40 mg total) by  mouth daily.  . bisoprolol (ZEBETA) 5 MG tablet Take 1 tablet (5 mg total) by mouth daily.  . budesonide-formoterol (SYMBICORT) 160-4.5 MCG/ACT inhaler Inhale 2 puffs into the lungs 2 (two) times daily.  . Carboxymethylcellul-Glycerin (LUBRICATING EYE DROPS OP) Apply 1 drop to eye daily as needed (dry eyes).  . Cholecalciferol (VITAMIN D3) 2000 units capsule Take 2,000 Units by mouth daily.   .  clopidogrel (PLAVIX) 75 MG tablet Take 1 tablet (75 mg total) by mouth daily with breakfast.  . etanercept (ENBREL SURECLICK) 50 MG/ML injection Inject 50 mg into the skin once a week.  . fluticasone (FLONASE) 50 MCG/ACT nasal spray USE 2 SPRAYS IN EACH NOSTRIL DAILY  . furosemide (LASIX) 40 MG tablet TK 1 T PO D PRN FOR FLUID OR EDEMA  . gabapentin (NEURONTIN) 800 MG tablet Take 800 mg by mouth 2 (two) times daily.    . insulin aspart (NOVOLOG) 100 UNIT/ML injection Inject 6-8 Units into the skin daily with supper. Sliding scale  . insulin glargine (LANTUS) 100 UNIT/ML injection Inject 0.55 mLs (55 Units total) into the skin at bedtime.  [name] leflunomide (ARAVA) 20 MG tablet Take 20 mg by mouth daily.   [name] levothyroxine (SYNTHROID, LEVOTHROID) 200 MCG tablet Take 200 mcg by mouth daily before breakfast.  . Liraglutide (VICTOZA) 18 MG/3ML SOPN Inject 1.2 mg into the skin at bedtime.   [name] losartan (COZAAR) 50 MG tablet Take 1 tablet (50 mg total) by mouth daily.  . metFORMIN (GLUCOPHAGE) 1000 MG tablet Take 1,000 mg by mouth 2 (two) times daily with a meal.  . Multiple Vitamins-Minerals (MEGA MULTIVITAMIN FOR MEN PO) Take 1 tablet by mouth daily.   . OXYGEN Inhale 2 L into the lungs continuous.  . sertraline (ZOLOFT) 100 MG tablet Take 50 mg by mouth daily.   [name] spironolactone (ALDACTONE) 25 MG tablet Take 0.5 tablets (12.5 mg total) by mouth daily.  . tamsulosin (FLOMAX) 0.4 MG CAPS capsule Take 2 capsules (0.8 mg total) by mouth at bedtime.  [name] tiotropium (SPIRIVA HANDIHALER) 18 MCG inhalation capsule Place 18 mcg into inhaler and inhale daily.     No facility-administered encounter medications on file as of 02/26/2017.      Review of Systems  Constitutional:   No  weight loss, night sweats,  Fevers, chills, + fatigue, or  lassitude.  HEENT:   No headaches,  Difficulty swallowing,  Tooth/dental problems, or  Sore throat,                No sneezing, itching, ear ache, nasal congestion, post nasal  drip,   CV:  No chest pain,  Orthopnea, PND, swelling in lower extremities, anasarca, dizziness, palpitations, syncope.   GI  No heartburn, indigestion, abdominal pain, nausea, vomiting, diarrhea, change in bowel habits, loss of appetite, bloody stools.   Resp:  t.  No excess mucus, no productive cough,  No non-productive cough,  No coughing up of blood.  No change in color of mucus.  No wheezing.  No chest wall deformity  Skin: no rash or lesions.  GU: no dysuria, change in color of urine, no urgency or frequency.  No flank pain, no hematuria   MS:  No joint pain or swelling.  No decreased range of motion.  No back pain.    Physical Exam  BP 134/84 (BP Location: Left Arm, Patient Position: Sitting, Cuff Size: Normal)   Pulse (!) 55   Ht 6\' 2"  (1.88 m)   Wt 223 lb (101.2  kg)   SpO2 92%   BMI 28.63 kg/m   GEN: A/Ox3; pleasant , NAD, eldelry on O2    HEENT:  [place]/AT,  EACs-clear, TMs-wnl, NOSE-clear, THROAT-clear, no lesions, no postnasal drip or exudate noted.   NECK:  Supple w/ fair ROM; no JVD; normal carotid impulses w/o bruits; no thyromegaly or nodules palpated; no lymphadenopathy.    RESP  Decreased BS in bases ,  no accessory muscle use, no dullness to percussion  CARD:  RRR, no m/r/g, no peripheral edema, pulses intact, no cyanosis or clubbing.  GI:   Soft & nt; nml bowel sounds; no organomegaly or masses detected.   Musco: Warm bil, no deformities or joint swelling noted.   Neuro: alert, no focal deficits noted.    Skin: Warm, no lesions or rashes    Lab Results:  CBC  BMET   BNP No results found for: BNP  ProBNP  Imaging: Dg Chest 2 View  Result Date: 02/26/2017 CLINICAL DATA:  Low up pneumonia.  Clinically improving. EXAM: CHEST  2 VIEW COMPARISON:  Chest x-ray of February 05, 2017 and chest CT scan of January 22, 2017. FINDINGS: The interstitial markings are both lungs are coarse though stable. There is mild chronic volume loss on the right. Patchy  interstitial density in the right mid and lower lobe is stable. There is a small right pleural effusion. On the left there is persistent suprahilar masslike density. There is a small left pleural effusion. The heart and pulmonary vascularity are normal. There is stable pleural thickening on the right. IMPRESSION: Persistent abnormal masslike density in the left upper lobe. Stable interstitial density in the right mid and lower lung with some pleural thickening. Stable small bilateral pleural effusions. Given these persistent chest x-ray findings and the findings on the previous CT scan, PET-CT scanning is again recommended in an effort to exclude malignancy. Electronically Signed   By: David  [place] M.D.   On: 02/26/2017 10:14   Dg Chest 2 View  Result Date: 02/05/2017 CLINICAL DATA:  Pt c/o continuing cough since pneumonia dx, copd, cad, htn, RA, pulmonary fibrosis, PTSD, CABG EXAM: CHEST - 2 VIEW COMPARISON:  12/25/2016 and earlier studies FINDINGS: Significant improvement in the left upper lobe airspace disease since previous exam. There are still some residual left suprahilar opacities which were not present on earlier film of 10/28/2016. Scarring and parenchymal distortion at the left lung base and in the right lung are stable since previous studies. Asymmetric right pleuroparenchymal thickening stable. Partially calcified right pleural plaques as before. Chronic blunting of lateral costophrenic angles right greater than left as before. Heart size and mediastinal contours are within normal limits. No definite effusion. Visualized bones unremarkable. IMPRESSION: 1. Interval improvement in left upper lobe airspace disease with residual left suprahilar opacities. Continued follow-up to confirm appropriate resolution and exclude neoplasm recommended. Electronically Signed   By: Corlis Leak M.D.   On: 02/05/2017 10:02     Assessment & Plan:   No problem-specific Assessment & Plan notes found for this  encounter.     Rubye Oaks, NP 02/26/2017

## 2017-02-26 NOTE — Assessment & Plan Note (Signed)
Cont on CPAP .,At bedtime

## 2017-03-12 ENCOUNTER — Telehealth: Payer: Self-pay | Admitting: Adult Health

## 2017-03-12 ENCOUNTER — Ambulatory Visit (HOSPITAL_BASED_OUTPATIENT_CLINIC_OR_DEPARTMENT_OTHER)
Admission: RE | Admit: 2017-03-12 | Discharge: 2017-03-12 | Disposition: A | Discharge status: Home / Self Care | Payer: Medicare Other | Source: Ambulatory Visit | Attending: Adult Health | Admitting: Adult Health

## 2017-03-12 DIAGNOSIS — I2510 Atherosclerotic heart disease of native coronary artery without angina pectoris: Secondary | ICD-10-CM | POA: Diagnosis not present

## 2017-03-12 DIAGNOSIS — I712 Thoracic aortic aneurysm, without rupture: Secondary | ICD-10-CM | POA: Insufficient documentation

## 2017-03-12 DIAGNOSIS — J439 Emphysema, unspecified: Secondary | ICD-10-CM | POA: Diagnosis not present

## 2017-03-12 DIAGNOSIS — R918 Other nonspecific abnormal finding of lung field: Secondary | ICD-10-CM

## 2017-03-12 DIAGNOSIS — R911 Solitary pulmonary nodule: Secondary | ICD-10-CM

## 2017-03-12 DIAGNOSIS — K8020 Calculus of gallbladder without cholecystitis without obstruction: Secondary | ICD-10-CM | POA: Diagnosis not present

## 2017-03-12 DIAGNOSIS — I281 Aneurysm of pulmonary artery: Secondary | ICD-10-CM | POA: Diagnosis not present

## 2017-03-12 NOTE — Telephone Encounter (Signed)
PET scan ordered  Nothing further needed; will sign off

## 2017-03-12 NOTE — Progress Notes (Signed)
Patient is aware of CT results per TP Orders only encounter created for the PET scan order

## 2017-03-12 NOTE — Telephone Encounter (Signed)
  Call report received from [facility] @ [facility] Radiology CT Dept - please see impression, PET recommended.   IMPRESSION: 1. Irregular masslike consolidation in the anterior left upper lobe measuring up to 3.9 cm is stable since 01/22/2017 and new since 10/09/2015. This finding remains indeterminate for primary bronchogenic carcinoma versus chronic infection. Given the interval development of mild bilateral mediastinal lymphadenopathy, PET-CT is recommended for further characterization of these findings. 2. New 7 mm solid left lower lobe pulmonary nodule, which is below PET resolution. Non-contrast chest CT at 6-12 months is recommended. If the nodule is stable at time of repeat CT, then future CT at 18-24 months (from today's scan) is considered optional for low-risk patients, but is recommended for high-risk patients. This recommendation follows the consensus statement: Guidelines for Management of Incidental Pulmonary Nodules Detected on CT Images: From the Fleischner Society 2017; Radiology 2017; 284:228-243. 3. Stable extensive right-sided pleural thickening and calcified pleural plaques with associated extensive right lung parenchymal banding, volume loss and distortion, probably the sequela of remote right pleural empyema or hemothorax. 4. Trace dependent left pleural effusion is decreased. 5. Three-vessel coronary atherosclerosis. 6. 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 7. Stable dilated main pulmonary artery, suggesting chronic pulmonary arterial hypertension. 8. Cholelithiasis.

## 2017-03-12 NOTE — Telephone Encounter (Signed)
Spoke with pt , set up PET scan please

## 2017-03-20 ENCOUNTER — Ambulatory Visit (HOSPITAL_COMMUNITY)
Admission: RE | Admit: 2017-03-20 | Discharge: 2017-03-20 | Disposition: A | Discharge status: Home / Self Care | Payer: Medicare Other | Source: Ambulatory Visit | Attending: Adult Health | Admitting: Adult Health

## 2017-03-20 DIAGNOSIS — J948 Other specified pleural conditions: Secondary | ICD-10-CM | POA: Insufficient documentation

## 2017-03-20 DIAGNOSIS — R918 Other nonspecific abnormal finding of lung field: Secondary | ICD-10-CM | POA: Diagnosis not present

## 2017-03-20 LAB — GLUCOSE, CAPILLARY: GLUCOSE-CAPILLARY: 72 mg/dL (ref 65–99)

## 2017-03-20 MED ORDER — FLUDEOXYGLUCOSE F - 18 (FDG) INJECTION
10.7000 | Freq: Once | INTRAVENOUS | Status: AC | PRN
  Administered 2017-03-20: 10.7 via INTRAVENOUS

## 2017-04-07 ENCOUNTER — Ambulatory Visit: Payer: Medicare Other | Admitting: Internal Medicine

## 2017-04-09 ENCOUNTER — Ambulatory Visit (INDEPENDENT_AMBULATORY_CARE_PROVIDER_SITE_OTHER): Payer: Medicare Other | Admitting: Pulmonary Disease

## 2017-04-09 ENCOUNTER — Encounter: Payer: Self-pay | Admitting: Pulmonary Disease

## 2017-04-09 DIAGNOSIS — J449 Chronic obstructive pulmonary disease, unspecified: Secondary | ICD-10-CM | POA: Diagnosis not present

## 2017-04-09 DIAGNOSIS — J181 Lobar pneumonia, unspecified organism: Secondary | ICD-10-CM | POA: Diagnosis not present

## 2017-04-09 DIAGNOSIS — J189 Pneumonia, unspecified organism: Secondary | ICD-10-CM

## 2017-04-09 DIAGNOSIS — J9611 Chronic respiratory failure with hypoxia: Secondary | ICD-10-CM | POA: Diagnosis not present

## 2017-04-09 NOTE — Assessment & Plan Note (Signed)
Although hypermetabolic platelike consolidation in the left upper lobe persists, this could still indicated resolving pneumonia, slow to resolve due to underlying chronic lung disease  Obtain chest x-ray in mid October-we will call you with results, based on this we will arrange for CT scan in November before next visit  If he needs biopsy, Plavix will have to be stopped and would favor bronchoscopy with washings and brushings. Would defer at this time given likely possibility of benign, no diarrhea normal chest x-ray in 4/28

## 2017-04-09 NOTE — Patient Instructions (Signed)
Trial of BREO once daily instead of Symbicort -call us back for prescription of this works  Obtain chest x-ray in mid [place] will call you with results, based on this we will arrange for CT scan in November before next visit

## 2017-04-09 NOTE — Assessment & Plan Note (Signed)
Trial of BREO once daily instead of Symbicort -call us back for prescription of this works

## 2017-04-09 NOTE — Assessment & Plan Note (Signed)
Ct 2L O2  

## 2017-04-09 NOTE — Progress Notes (Signed)
   Subjective:    Patient ID: [name], male    DOB: March 26, 1941, 76 y.o.   MRN: [id number]  HPI  76 year old male former smoker with gold D COPD, chronic respiratory failure on oxygen Has rheumatoid arthritis and diabetes He had cardiac cath with PCI to RCA and is on Plavix since 08/2016  He was treated for left upper lobe pneumonia in June 2018, this seemed to resolve but there was some residual density in the left upper lobe. PET scan showed hypermetabolic left upper lobe platelike density SUV 5.5  I have reviewed all his imaging studies dating back to chest x-ray 5/31 which first demonstrates left upper lobe infiltrate, interestingly his chest x-ray from 10/2016 does not show this infiltrate   His sputum production is subsided, shortness of breath is at baseline, denies wheezing, or pedal edema. He is compliant with Symbicort and Spiriva  Significant tests/ events reviewed  PSG 04/2014 showed severe OSA, AHI 48/h with nadir desatn 78% correctd by CPAP 12 cm, 3 L O2 blended in , c flex +2 cm, humidity, A medium full face mask was used.  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  PFT 10/2015 FEV1 was 36%, ratio 46, FVC 58%, DLCO 26%   Past Medical History:  Diagnosis Date  . Anxiety   . BENIGN PROSTATIC HYPERTROPHY 11/23/2009  . Cardiomyopathy (HCC) 08/28/2016  . COPD (chronic obstructive pulmonary disease) (HCC)   . CORONARY ARTERY DISEASE 11/23/2009  . 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 DEGENERATIVE JOINT DISEASE 11/23/2009  . DEPRESSION 11/23/2009  . FATIGUE 11/23/2009  . GAIT DISTURBANCE 12/10/2009  . HEMOPTYSIS UNSPECIFIED 05/07/2010  . High cholesterol   . HYPERTENSION 07/30/2009  . HYPOTHYROIDISM 07/30/2009  . 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 On home oxygen  therapy    "2-3L; 24/7" (09/10/2016)  . OSA on CPAP   . PTSD (post-traumatic stress disorder) 03/10/2012  . 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 RA (rheumatoid arthritis) (HCC) 06/11/2011   "qwhere" (09/10/2016)  . RESPIRATORY FAILURE, CHRONIC 07/31/2009  . Scleritis of both eyes 03/17/2014  . 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 Type II diabetes mellitus (HCC)      Review of Systems neg for any significant sore throat, dysphagia, itching, sneezing, nasal congestion or excess/ purulent secretions, fever, chills, sweats, unintended wt loss, pleuritic or exertional cp, hempoptysis, orthopnea pnd or change in chronic leg swelling. Also denies presyncope, palpitations, heartburn, abdominal pain, nausea, vomiting, diarrhea or change in bowel or urinary habits, dysuria,hematuria, rash, arthralgias, visual complaints, headache, numbness weakness or ataxia.     Objective:   Physical Exam  Gen. Pleasant, well-nourished, in no distress, chr ill appearing on nasal O2 ENT - no thrush, no post nasal drip Neck: No JVD, no thyromegaly, no carotid bruits Lungs: no use of accessory muscles, no dullness to percussion, decreased without rales or rhonchi  Cardiovascular: Rhythm regular, heart sounds  normal, no murmurs or gallops, no peripheral edema Musculoskeletal: No deformities, no cyanosis or clubbing        Assessment & Plan:

## 2017-04-13 ENCOUNTER — Telehealth: Payer: Self-pay | Admitting: Family Medicine

## 2017-04-13 NOTE — Telephone Encounter (Signed)
Pt called in and said that pt move dr to [facility] in [place], he said they told him they would cancel appt on 9/11 with john.  He rec'd a bill for no show fee.  He would like to have that waived

## 2017-04-27 NOTE — Telephone Encounter (Signed)
Please note , we did not charge patient for the NS on [facility] 04/07/17. He received the NS letter, not a bill.

## 2017-05-01 ENCOUNTER — Telehealth: Payer: Self-pay | Admitting: Pulmonary Disease

## 2017-05-01 MED ORDER — FLUTICASONE FUROATE-VILANTEROL 100-25 MCG/INH IN AEPB: 1.0000 | INHALATION_SPRAY | Freq: Every day | RESPIRATORY_TRACT | 1 refills | Status: DC

## 2017-05-01 NOTE — Telephone Encounter (Signed)
lmomtcb x 1 for the pt 

## 2017-05-01 NOTE — Telephone Encounter (Signed)
Patient called back stating it is not [facility], but requesting BREO.Marland KitchenMarland Kitchen

## 2017-05-01 NOTE — Telephone Encounter (Signed)
Called and spoke with pt and he is aware that the breo has been sent to the pharmacy and nothing further is needed.

## 2017-05-01 NOTE — Telephone Encounter (Signed)
ATC pt, no answer. Left message for pt to call back.  

## 2017-05-08 ENCOUNTER — Other Ambulatory Visit: Payer: Self-pay

## 2017-05-08 DIAGNOSIS — J449 Chronic obstructive pulmonary disease, unspecified: Secondary | ICD-10-CM

## 2017-05-11 ENCOUNTER — Ambulatory Visit: Payer: Medicare Other

## 2017-05-14 ENCOUNTER — Ambulatory Visit (HOSPITAL_BASED_OUTPATIENT_CLINIC_OR_DEPARTMENT_OTHER)
Admission: RE | Admit: 2017-05-14 | Discharge: 2017-05-14 | Disposition: A | Discharge status: Home / Self Care | Payer: Medicare Other | Source: Ambulatory Visit | Attending: Pulmonary Disease | Admitting: Pulmonary Disease

## 2017-05-14 DIAGNOSIS — J449 Chronic obstructive pulmonary disease, unspecified: Secondary | ICD-10-CM | POA: Diagnosis not present

## 2017-05-14 DIAGNOSIS — M899 Disorder of bone, unspecified: Secondary | ICD-10-CM | POA: Insufficient documentation

## 2017-05-14 DIAGNOSIS — R918 Other nonspecific abnormal finding of lung field: Secondary | ICD-10-CM | POA: Diagnosis not present

## 2017-06-02 ENCOUNTER — Telehealth: Payer: Self-pay | Admitting: Family Medicine

## 2017-06-02 NOTE — Telephone Encounter (Signed)
Patient Name: [name]  DOB: 04/23/1941    Initial Comment Caller states her husband he is having chills, dizziness and diarrhea. He does not want to see anyone else but his doctor. Doctor is out till tomorrow.    Nurse Assessment  Nurse: Stefano Gaul, RN, Dwana Curd Date/Time (Eastern Time): 06/02/2017 10:01:58 AM  Confirm and document reason for call. If symptomatic, describe symptoms. ---Caller states spouse is having chills. He is dizzy and has diarrhea. Had had symptoms 3-4 days. Having 3-4 diarrhea stools daily. He has been incontinent of stools. Dizziness started with the diarrhea. No fever. He is urinating.  Does the patient have any new or worsening symptoms? ---Yes  Will a triage be completed? ---Yes  Related visit to physician within the last 2 weeks? ---No  Does the PT have any chronic conditions? (i.e. diabetes, asthma, etc.) ---Yes  List chronic conditions. ---COPD; diabetes; heart problems  Is this a behavioral health or substance abuse call? ---No     Guidelines    Guideline Title Affirmed Question Affirmed Notes  Dizziness - Lightheadedness [1] MODERATE dizziness (e.g., interferes with normal activities) AND [2] has NOT been evaluated by physician for this (Exception: dizziness caused by heat exposure, sudden standing, or poor fluid intake)   Diarrhea [1] MODERATE diarrhea (e.g., 4-6 times / day more than normal) AND [2] present > 48 hours (2 days)    Final Disposition User   See Physician within 24 Hours Opp, RN, Vera    Comments  appt scheduled for 06/03/2017 at 10 am with Dr. Arva Chafe   Referrals  REFERRED TO PCP OFFICE  REFERRED TO PCP OFFICE   Caller Disagree/Comply Comply  Caller Understands Yes  PreDisposition Call Doctor

## 2017-06-03 ENCOUNTER — Encounter: Payer: Self-pay | Admitting: Family Medicine

## 2017-06-03 ENCOUNTER — Ambulatory Visit: Payer: Medicare Other | Admitting: Family Medicine

## 2017-06-03 VITALS — BP 120/72 | HR 75 | Temp 99.0°F | Ht 74.0 in | Wt 217.0 lb

## 2017-06-03 DIAGNOSIS — R195 Other fecal abnormalities: Secondary | ICD-10-CM | POA: Diagnosis not present

## 2017-06-03 DIAGNOSIS — R110 Nausea: Secondary | ICD-10-CM | POA: Diagnosis not present

## 2017-06-03 DIAGNOSIS — R5383 Other fatigue: Secondary | ICD-10-CM

## 2017-06-03 LAB — BASIC METABOLIC PANEL
BUN: 30 mg/dL — ABNORMAL HIGH (ref 6–23)
CALCIUM: 9.2 mg/dL (ref 8.4–10.5)
CHLORIDE: 100 meq/L (ref 96–112)
CO2: 27 mEq/L (ref 19–32)
CREATININE: 1.3 mg/dL (ref 0.40–1.50)
GFR: 57.08 mL/min — AB (ref >60.00)
Glucose, Bld: 200 mg/dL — ABNORMAL HIGH (ref 70–99)
Potassium: 4.5 mEq/L (ref 3.5–5.1)
Sodium: 136 mEq/L (ref 135–145)

## 2017-06-03 LAB — CBC
HEMATOCRIT: 32.8 % — AB (ref 39.0–52.0)
HEMOGLOBIN: 10.5 g/dL — AB (ref 13.0–17.0)
MCHC: 32.1 g/dL (ref 30.0–36.0)
MCV: 83.3 fl (ref 78.0–100.0)
Platelets: 90 10*3/uL — ABNORMAL LOW (ref 150.0–400.0)
RBC: 3.94 Mil/uL — ABNORMAL LOW (ref 4.22–5.81)
RDW: 15.6 % — AB (ref 11.5–15.5)
WBC: 8.3 10*3/uL (ref 4.0–10.5)

## 2017-06-03 MED ORDER — ONDANSETRON HCL 4 MG PO TABS: 4.0000 mg | ORAL_TABLET | Freq: Three times a day (TID) | ORAL | 0 refills | Status: DC | PRN

## 2017-06-03 NOTE — Progress Notes (Signed)
Pre visit review using our clinic review tool, if applicable. No additional management support is needed unless otherwise documented below in the visit note. 

## 2017-06-03 NOTE — Progress Notes (Signed)
Chief Complaint  Patient presents with  . Fatigue    no appetite  . Diarrhea  . Dizziness    [name] is 76 y.o. male here for complaint of diarrhea.  Duration: 2 weeks Abdominal pain? No Bleeding? No Recent travel? No Recent antibiotic use? No Sick contacts? No Fevers? No  He has been using Imodium for the past week and has not had a bowel movement since then. He is having associated fatigue and dizziness.  Granted, his appetite is down as he is not eating or drinking as much.  ROS:  GI: As noted in HPI Const: Denies fevers  Past Medical History:  Diagnosis Date  . Anxiety   . BENIGN PROSTATIC HYPERTROPHY 11/23/2009  . Cardiomyopathy (HCC) 08/28/2016  . COPD (chronic obstructive pulmonary disease) (HCC)   . CORONARY ARTERY DISEASE 11/23/2009  . 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 DEGENERATIVE JOINT DISEASE 11/23/2009  . DEPRESSION 11/23/2009  . FATIGUE 11/23/2009  . GAIT DISTURBANCE 12/10/2009  . HEMOPTYSIS UNSPECIFIED 05/07/2010  . High cholesterol   . HYPERTENSION 07/30/2009  . HYPOTHYROIDISM 07/30/2009  . 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 On home oxygen therapy    "2-3L; 24/7" (09/10/2016)  . OSA on CPAP   . PTSD (post-traumatic stress disorder) 03/10/2012  . 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 RA (rheumatoid arthritis) (HCC) 06/11/2011   "qwhere" (09/10/2016)  . RESPIRATORY FAILURE, CHRONIC 07/31/2009  . Scleritis of both eyes 03/17/2014  . 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 Type II diabetes mellitus (HCC)    Family History  Problem Relation Age of Onset  . Other Mother        gun shot    Allergies as of 06/03/2017   No Known Allergies     Medication List        Accurate as of 06/03/17 12:09 PM. Always use your most recent med list.          acetaminophen 325 MG tablet Commonly known as:  TYLENOL Take 500 mg daily as needed by mouth for moderate pain or headache.   albuterol 108 (90 Base) MCG/ACT inhaler Commonly known as:  PROAIR HFA Inhale 2 puffs  into the lungs every 4 (four) hours as needed. Shortness of breath   albuterol (2.5 MG/3ML) 0.083% nebulizer solution Commonly known as:  PROVENTIL 1 vial in nebulizer every 6 hours as needed Dx 496   aspirin EC 81 MG tablet Take 1 tablet (81 mg total) by mouth daily.   atorvastatin 40 MG tablet Commonly known as:  LIPITOR Take 1 tablet (40 mg total) by mouth daily.   bisoprolol 5 MG tablet Commonly known as:  ZEBETA Take 1 tablet (5 mg total) by mouth daily.   clopidogrel 75 MG tablet Commonly known as:  PLAVIX Take 1 tablet (75 mg total) by mouth daily with breakfast.   ENBREL SURECLICK 50 MG/ML injection Generic drug:  etanercept Inject 50 mg into the skin once a week.   fluticasone furoate-vilanterol 100-25 MCG/INH Aepb Commonly known as:  BREO ELLIPTA Inhale 1 puff into the lungs daily.   furosemide 40 MG tablet Commonly known as:  LASIX TK 1 T PO D PRN FOR FLUID OR EDEMA   gabapentin 800 MG tablet Commonly known as:  NEURONTIN Take 800 mg by mouth 2 (two) times daily.   insulin glargine 100 UNIT/ML injection Commonly known as:  LANTUS Inject 0.55 mLs (55 Units total) into the skin at bedtime.   leflunomide 20 MG tablet Commonly known as:  ARAVA  Take 20 mg by mouth daily.   levothyroxine 200 MCG tablet Commonly known as:  SYNTHROID, LEVOTHROID Take 200 mcg by mouth daily before breakfast.   losartan 50 MG tablet Commonly known as:  COZAAR Take 1 tablet (50 mg total) by mouth daily.   LUBRICATING EYE DROPS OP Apply 1 drop to eye daily as needed (dry eyes).   MEGA MULTIVITAMIN FOR MEN PO Take 1 tablet by mouth daily.   metFORMIN 1000 MG tablet Commonly known as:  GLUCOPHAGE Take 750 mg 2 (two) times daily by mouth.   ondansetron 4 MG tablet Commonly known as:  ZOFRAN Take 1 tablet (4 mg total) every 8 (eight) hours as needed by mouth for nausea or vomiting.   OXYGEN Inhale 2 L into the lungs continuous.   OZEMPIC 0.25 or 0.5 MG/DOSE  Sopn Generic drug:  Semaglutide Inject into the skin.   sertraline 100 MG tablet Commonly known as:  ZOLOFT Take 50 mg by mouth daily.   SPIRIVA HANDIHALER 18 MCG inhalation capsule Generic drug:  tiotropium Place 18 mcg into inhaler and inhale daily.   spironolactone 25 MG tablet Commonly known as:  ALDACTONE Take 0.5 tablets (12.5 mg total) by mouth daily.   tamsulosin 0.4 MG Caps capsule Commonly known as:  FLOMAX Take 2 capsules (0.8 mg total) by mouth at bedtime.   Vitamin D3 2000 units capsule Take 2,000 Units by mouth daily.       BP 120/72 (BP Location: Left Arm, Patient Position: Sitting, Cuff Size: Normal)   Pulse 75   Temp 99 F (37.2 C) (Oral)   Ht 6\' 2"  (1.88 m)   Wt 217 lb (98.4 kg)   SpO2 94%   BMI 27.86 kg/m  Gen: awake, alert, appearing stated age HEENT: MMM Heart: RRR, no LE edema Lungs: CTAB, no accessory muscle use Abd: BS+, soft, TTP, non-distended, no masses or organomegaly Psych: Age appropriate judgment and insight  Loose stools - Plan: Stool Culture, Ova and parasite examination, CBC, Basic metabolic panel  Fatigue, unspecified type - Plan: CBC, Basic metabolic panel  Nausea  Encouraged adequate hydration with water and electrolyte replacement. Check labs to make sure there are no abnormalities. Stop Imodium, start fiber supplement. If he starts having loose stools again, stool cultures have been ordered. His other symptoms are likely secondary to GI loss and poor oral repletion. F/u prn. The patient voiced understanding and agreement to the plan.  [place], DO 06/03/17 12:09 PM

## 2017-06-03 NOTE — Patient Instructions (Addendum)
Give [place] 2-3 business days to get the results of your labs back. If labs are normal, you will likely receive a letter in the mail unless you have MyChart. This can take longer than 2-3 business days.   Stop Immodium. Start metamucil.   Let us know if you need anything.

## 2017-06-04 ENCOUNTER — Telehealth: Payer: Self-pay | Admitting: Adult Health

## 2017-06-04 MED ORDER — DOXYCYCLINE HYCLATE 100 MG PO TABS: 100.0000 mg | ORAL_TABLET | Freq: Two times a day (BID) | ORAL | 0 refills | Status: DC

## 2017-06-04 NOTE — Telephone Encounter (Signed)
Spoke with pt. States that he is not feeling well. Reports cough, chills, chest congestion and body aches. Cough is producing yellow mucus. Denies chest tightness, fever, wheezing and SOB. He saw his PCP and was tested for the flu but the test was negative. Pt declined an appointment today due to "just feeling to bad."  TP - please advise as the pt asked for this message to be sent to you. Thanks.

## 2017-06-04 NOTE — Telephone Encounter (Signed)
Begin Doxycycline 100mg  Twice daily  , take w/ food .  Mucinex DM Twice daily  As needed  Cough/congesiton  Fluids and rest  follow up in  1 week and As needed   Please contact office for sooner follow up if symptoms do not improve or worsen or seek emergency care

## 2017-06-11 ENCOUNTER — Encounter: Payer: Self-pay | Admitting: Adult Health

## 2017-06-11 ENCOUNTER — Ambulatory Visit: Payer: Medicare Other | Admitting: Pulmonary Disease

## 2017-06-11 ENCOUNTER — Ambulatory Visit (INDEPENDENT_AMBULATORY_CARE_PROVIDER_SITE_OTHER): Payer: Medicare Other | Admitting: Adult Health

## 2017-06-11 DIAGNOSIS — I519 Heart disease, unspecified: Secondary | ICD-10-CM | POA: Diagnosis not present

## 2017-06-11 DIAGNOSIS — G4733 Obstructive sleep apnea (adult) (pediatric): Secondary | ICD-10-CM

## 2017-06-11 DIAGNOSIS — J9611 Chronic respiratory failure with hypoxia: Secondary | ICD-10-CM | POA: Diagnosis not present

## 2017-06-11 DIAGNOSIS — R918 Other nonspecific abnormal finding of lung field: Secondary | ICD-10-CM

## 2017-06-11 DIAGNOSIS — Z9989 Dependence on other enabling machines and devices: Secondary | ICD-10-CM | POA: Diagnosis not present

## 2017-06-11 DIAGNOSIS — J441 Chronic obstructive pulmonary disease with (acute) exacerbation: Secondary | ICD-10-CM | POA: Diagnosis not present

## 2017-06-11 DIAGNOSIS — I5189 Other ill-defined heart diseases: Secondary | ICD-10-CM

## 2017-06-11 MED ORDER — PREDNISONE 10 MG PO TABS: ORAL_TABLET | ORAL | 0 refills | Status: DC

## 2017-06-11 NOTE — Assessment & Plan Note (Signed)
Cont on CPAP At bedtime  

## 2017-06-11 NOTE — Assessment & Plan Note (Signed)
LUL lung mass -non resolving  Area continues to persist in former smoker . PET positive area in LUL without hypermetabolic adenopathy. Will set up CT chest to see if any change . If not will need to proceed with tissue sampling. May be able to check CT guided biopsy which would be less invasive for pt that is high risk. However there is still a possibility this is non malignant as pt is immunosuppressed and lesion was not present on CXR in 10/2016 .  Will discuss case with Dr. Vassie Loll  As may need to consider FOB for BAL as well.

## 2017-06-11 NOTE — Assessment & Plan Note (Signed)
Cont on O2 .  

## 2017-06-11 NOTE — Progress Notes (Signed)
@Patient  ID: [name], male    DOB: 24-Oct-1940, 76 y.o.   MRN: [id number]  Chief Complaint  Patient presents with  . 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  PFT 10/2015 FEV1 was 36%, ratio 46, FVC 58%, DLCO 26% CXR 11/2016 LUL PNA ,  CT chest 12/2016 -masslike consolidation in LUL  CT chest 03/12/17 >stable masslike consolidation LUL 3.9cm , LUL 7 mm nodule  PET scan 03/20/17 >+metabolic act in LUL consolidation    06/11/2017 Follow up : COPD , O2 RF , LUL consolidation vs Mass  Patient returns for a follow-up.  Patient called into the office last week with increased cough congestion.  He was started on doxycycline twice daily for 1 week.  Patient says he is feeling some better but still has lingering cough and intermittent congestion . Appetite is down , not eating as well .  We have been following a presumed LUL PNA since  Late May 2018 . (CXR 10/2016 w/ no infiltrate) .  Slow to resolve on follow up scan . PET scan done on 03/20/17 positive act in LUL . He is immunosuppressed on Enbrel for RA.  Follow up CXR early Oct with no change . He is planned for CT chest this month , if no change will need biopsy . We discussed this in office today .  Pt has had weight loss over last 6 months ~15-20lbs . He denies hemoptysis or chest pain.   Remains on oxygen . 2-3 l/m   On CPAP At bedtime  . Wears each night . Feels rested.   Cares for wife with severe COPD on O2 . She is on hospice now.   [facility] to [employer] and Spiriva . Gets meds thru the [place] system.  Previously on BREO or Symbicort with Spiriva .      No Known Allergies  Immunization  History  Administered Date(s) Administered  . Influenza Split 04/14/2011, 04/27/2013  . Influenza Whole 05/28/2009, 04/27/2010, 03/31/2012  . Influenza, High Dose Seasonal PF 03/21/2015, 05/16/2017  . Influenza,inj,Quad PF,6+ Mos 03/17/2014, 04/04/2016  . Influenza-Unspecified 05/26/2011, 05/07/2012  . Pneumococcal Conjugate-13 09/20/2013  . Pneumococcal Polysaccharide-23 05/28/2008, 04/25/2010  . Pneumococcal-Unspecified 04/27/2013  . Td 11/23/2009  . Zoster 04/28/2009    Past Medical History:  Diagnosis Date  . Anxiety   . BENIGN PROSTATIC HYPERTROPHY 11/23/2009  . Cardiomyopathy (HCC) 08/28/2016  . COPD (chronic obstructive pulmonary disease) (HCC)   . CORONARY ARTERY DISEASE 11/23/2009  . 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 DEGENERATIVE JOINT DISEASE 11/23/2009  . DEPRESSION 11/23/2009  . FATIGUE 11/23/2009  . GAIT DISTURBANCE 12/10/2009  . HEMOPTYSIS UNSPECIFIED 05/07/2010  . High cholesterol   . HYPERTENSION 07/30/2009  . HYPOTHYROIDISM 07/30/2009  . 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 On home oxygen therapy    "2-3L; 24/7" (09/10/2016)  . OSA on CPAP   . PTSD (post-traumatic stress disorder) 03/10/2012  . 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 RA (rheumatoid arthritis) (HCC) 06/11/2011   "qwhere" (09/10/2016)  . RESPIRATORY FAILURE, CHRONIC 07/31/2009  . Scleritis of both eyes 03/17/2014  . 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 Type II diabetes mellitus (  HCC)     Tobacco History: Social History   Tobacco Use  Smoking Status Former Smoker  . Packs/day: 2.50  . Years: 40.00  . Pack years: 100.00  . Types: Cigarettes, Pipe, Cigars  . Last attempt to quit: 07/28/1998  . Years since quitting: 18.8  Smokeless Tobacco Never Used   Counseling given: Not Answered   Outpatient Encounter Medications as of 06/11/2017  Medication Sig  . acetaminophen (TYLENOL) 325 MG tablet Take 500 mg daily as needed by mouth for moderate pain or headache.   . albuterol (PROAIR HFA) 108 (90 BASE) MCG/ACT  inhaler Inhale 2 puffs into the lungs every 4 (four) hours as needed. Shortness of breath  . albuterol (PROVENTIL) (2.5 MG/3ML) 0.083% nebulizer solution 1 vial in nebulizer every 6 hours as needed Dx 496  . aspirin EC 81 MG tablet Take 1 tablet (81 mg total) by mouth daily.  [name] atorvastatin (LIPITOR) 40 MG tablet Take 1 tablet (40 mg total) by mouth daily.  . bisoprolol (ZEBETA) 5 MG tablet Take 1 tablet (5 mg total) by mouth daily.  . Carboxymethylcellul-Glycerin (LUBRICATING EYE DROPS OP) Apply 1 drop to eye daily as needed (dry eyes).  . Cholecalciferol (VITAMIN D3) 2000 units capsule Take 2,000 Units by mouth daily.   . clopidogrel (PLAVIX) 75 MG tablet Take 1 tablet (75 mg total) by mouth daily with breakfast.  . etanercept (ENBREL SURECLICK) 50 MG/ML injection Inject 50 mg into the skin once a week.  . furosemide (LASIX) 40 MG tablet TK 1 T PO D PRN FOR FLUID OR EDEMA  . gabapentin (NEURONTIN) 800 MG tablet Take 800 mg by mouth 2 (two) times daily.    . insulin glargine (LANTUS) 100 UNIT/ML injection Inject 0.55 mLs (55 Units total) into the skin at bedtime.  [name] levothyroxine (SYNTHROID, LEVOTHROID) 200 MCG tablet Take 200 mcg by mouth daily before breakfast.  . losartan (COZAAR) 50 MG tablet Take 1 tablet (50 mg total) by mouth daily.  . metFORMIN (GLUCOPHAGE) 1000 MG tablet Take 750 mg 2 (two) times daily by mouth.   . Multiple Vitamins-Minerals (MEGA MULTIVITAMIN FOR MEN PO) Take 1 tablet by mouth daily.   . ondansetron (ZOFRAN) 4 MG tablet Take 1 tablet (4 mg total) every 8 (eight) hours as needed by mouth for nausea or vomiting.  . OXYGEN Inhale 2 L into the lungs continuous.  . Semaglutide (OZEMPIC) 0.25 or 0.5 MG/DOSE SOPN Inject into the skin.  [name] sertraline (ZOLOFT) 100 MG tablet Take 50 mg by mouth daily.   [name] spironolactone (ALDACTONE) 25 MG tablet Take 0.5 tablets (12.5 mg total) by mouth daily.  . tamsulosin (FLOMAX) 0.4 MG CAPS capsule Take 2 capsules (0.8 mg total) by mouth at  bedtime.  . predniSONE (DELTASONE) 10 MG tablet 4 tabs for 2 days, then 3 tabs for 2 days, 2 tabs for 2 days, then 1 tab for 2 days, then stop  . tiotropium (SPIRIVA HANDIHALER) 18 MCG inhalation capsule Place 18 mcg into inhaler and inhale daily.    . [DISCONTINUED] doxycycline (VIBRA-TABS) 100 MG tablet Take 1 tablet (100 mg total) 2 (two) times daily by mouth. (Patient not taking: Reported on 06/11/2017)  . [DISCONTINUED] fluticasone furoate-vilanterol (BREO ELLIPTA) 100-25 MCG/INH AEPB Inhale 1 puff into the lungs daily. (Patient not taking: Reported on 06/11/2017)  . [DISCONTINUED] leflunomide (ARAVA) 20 MG tablet Take 20 mg by mouth daily.    No facility-administered encounter medications on file as of 06/11/2017.  Review of Systems  Constitutional:   No    night sweats,  Fevers, chills,  +fatigue, or  lassitude.  HEENT:   No headaches,  Difficulty swallowing,  Tooth/dental problems, or  Sore throat,                No sneezing, itching, ear ache, nasal congestion, post nasal drip,   CV:  No chest pain,  Orthopnea, PND, swelling in lower extremities, anasarca, dizziness, palpitations, syncope.   GI  No heartburn, indigestion, abdominal pain, nausea, vomiting, diarrhea, change in bowel habits, loss of appetite, bloody stools.   Resp:  No chest wall deformity  Skin: no rash or lesions.  GU: no dysuria, change in color of urine, no urgency or frequency.  No flank pain, no hematuria   MS:  No joint pain or swelling.  No decreased range of motion.  No back pain.    Physical Exam  BP 137/68 (BP Location: Left Arm, Cuff Size: Normal)   Pulse 66   Temp 98.2 F (36.8 C) (Oral)   Ht 6\' 2"  (1.88 m)   Wt 212 lb (96.2 kg)   SpO2 95%   BMI 27.22 kg/m   GEN: A/Ox3; pleasant , NAD, chronically ill appearing on O2    HEENT:  [place]/AT,  EACs-clear, TMs-wnl, NOSE-clear, THROAT-clear, no lesions, no postnasal drip or exudate noted.   NECK:  Supple w/ fair ROM; no JVD; normal  carotid impulses w/o bruits; no thyromegaly or nodules palpated; no lymphadenopathy.    RESP  Decreased BS in bases ,  no accessory muscle use, no dullness to percussion  CARD:  RRR, no m/r/g, no peripheral edema, pulses intact, no cyanosis or clubbing.  GI:   Soft & nt; nml bowel sounds; no organomegaly or masses detected.   Musco: Warm bil, no deformities or joint swelling noted.   Neuro: alert, no focal deficits noted.    Skin: Warm, no lesions or rashes    Lab Results:    BNP No results found for: BNP  ProBNP    Component Value Date/Time   PROBNP 188 10/20/2016 1038   PROBNP 59.9 05/04/2011 0517    Imaging: Dg Chest 2 View  Result Date: 05/14/2017 CLINICAL DATA:  76 year old male with left upper lobe mass like opacity, with indeterminate PET-CT findings in August. Subsequent encounter. EXAM: CHEST  2 VIEW COMPARISON:  PET-CT 03/20/2017. Chest CT 03/12/2017, Chest radiographs 02/26/2017, and earlier FINDINGS: Stable radiographic appearance since 02/26/2017 of the patchy in spiculated left suprahilar density. No superimposed pneumothorax, pulmonary edema or left pleural effusion. Stable left hilar and mediastinal contours. Superimposed chronic right fibrothorax with calcified pleural plaques or thickening as seen by CT. Stable right lung volume and opacity. Osteopenia. No acute osseous abnormality identified. Negative visible bowel gas pattern. IMPRESSION: 1. Unchanged radiographic appearance of the left suprahilar lesions since 02/26/2017. 2. Stable chronic right lung fibrothorax. 3. No new radiographic abnormality. Electronically Signed   By: 04/28/2017 M.D.   On: 05/14/2017 14:19     Assessment & Plan:   COPD exacerbation Slowly resolving COPD flare - short course of steroids  Now on Spiriva /Striverdi combo   Plan  Patient Instructions  Set up CT chest in 2 weeks .  Prednisone taper over next week.  Do not take Symbicort or BREO .  Continue on Sriverdi daily .    Continue on Spiriva daily.  Continue on Oxygen 2l/m .  Continue on CPAP At bedtime  With oxygen .  Follow  up with in 6 weeks Dr. Vassie Loll  Or Harold Mattes NP  Please contact office for sooner follow up if symptoms do not improve or worsen or seek emergency care      OSA on CPAP Cont on CPAP At bedtime  .   RESPIRATORY FAILURE, CHRONIC Cont on O2   Diastolic dysfunction Appears compensated. Exam w/ no evidence of vol overload.   Lung mass LUL lung mass -non resolving  Area continues to persist in former smoker . PET positive area in LUL without hypermetabolic adenopathy. Will set up CT chest to see if any change . If not will need to proceed with tissue sampling. May be able to check CT guided biopsy which would be less invasive for pt that is high risk. However there is still a possibility this is non malignant as pt is immunosuppressed and lesion was not present on CXR in 10/2016 .  Will discuss case with Dr. Vassie Loll  As may need to consider FOB for BAL as well.       Rubye [place], NP 06/11/2017

## 2017-06-11 NOTE — Assessment & Plan Note (Signed)
Slowly resolving COPD flare - short course of steroids  Now on Spiriva /Striverdi combo   Plan  Patient Instructions  Set up CT chest in 2 weeks .  Prednisone taper over next week.  Do not take Symbicort or BREO .  Continue on Sriverdi daily .  Continue on Spiriva daily.  Continue on Oxygen 2l/m .  Continue on CPAP At bedtime  With oxygen .  Follow up with in 6 weeks Dr. Vassie Loll  Or Shaylin Blatt NP  Please contact office for sooner follow up if symptoms do not improve or worsen or seek emergency care

## 2017-06-11 NOTE — Assessment & Plan Note (Signed)
Appears compensated. Exam w/ no evidence of vol overload.

## 2017-06-11 NOTE — Patient Instructions (Addendum)
Set up CT chest in 2 weeks .  Prednisone taper over next week.  Do not take Symbicort or BREO .  Continue on Sriverdi daily .  Continue on Spiriva daily.  Continue on Oxygen 2l/m .  Continue on CPAP At bedtime  With oxygen .  Follow up with in 6 weeks Dr. Vassie Loll  Or [place] NP  Please contact office for sooner follow up if symptoms do not improve or worsen or seek emergency care

## 2017-06-11 NOTE — Progress Notes (Signed)
HPI: FU CAD. Carotid dopplers 1/18 showed 40-59 left and 1-39 right. Echo 2/18 showed EF 35-40. 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 There was aneurysmal dilatation of the aorta at the level of the SMA at 3.1 cm. 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 There was a mass at the left apex. Patient is now followed by pulmonary for this mass. Since last seen, he has some dyspnea on exertion but no orthopnea, PND, pedal edema chest pain or syncope.  Current Outpatient Medications  Medication Sig Dispense Refill  . acetaminophen (TYLENOL) 325 MG tablet Take 500 mg daily as needed by mouth for moderate pain or headache.     . albuterol (PROAIR HFA) 108 (90 BASE) MCG/ACT inhaler Inhale 2 puffs into the lungs every 4 (four) hours as needed. Shortness of breath 1 Inhaler 6  . albuterol (PROVENTIL) (2.5 MG/3ML) 0.083% nebulizer solution 1 vial in nebulizer every 6 hours as needed Dx 496 120 mL 6  . aspirin EC 81 MG tablet Take 1 tablet (81 mg total) by mouth daily.    [name] atorvastatin (LIPITOR) 40 MG tablet Take 1 tablet (40 mg total) by mouth daily. 90 tablet 3  . bisoprolol (ZEBETA) 5 MG tablet Take 1 tablet (5 mg total) by mouth daily. 30 tablet 11  . Carboxymethylcellul-Glycerin (LUBRICATING EYE DROPS OP) Apply 1 drop to eye daily as needed (dry eyes).    . Cholecalciferol (VITAMIN D3) 2000 units capsule Take 2,000 Units by mouth daily.     . clopidogrel (PLAVIX) 75 MG tablet Take 1 tablet (75 mg total) by mouth daily with breakfast. 30 tablet 11  . furosemide (LASIX) 40 MG tablet TK 1 T PO D PRN FOR FLUID OR EDEMA  11  . insulin glargine (LANTUS) 100 UNIT/ML injection Inject 0.55 mLs (55 Units total) into the skin at bedtime. 40 mL 0  . levofloxacin (LEVAQUIN) 500 MG  tablet Take 1 tablet (500 mg total) daily by mouth. 7 tablet 0  . levothyroxine (SYNTHROID, LEVOTHROID) 125 MCG tablet Take 250 mcg by mouth daily before breakfast.    . losartan (COZAAR) 50 MG tablet Take 1 tablet (50 mg total) by mouth daily. 30 tablet 0  . magnesium (MAGTAB) 84 MG ( ) TBCR SR tablet Take 168 mg by mouth at bedtime.    . metFORMIN (GLUCOPHAGE-XR) 750 MG 24 hr tablet Take 750 mg by mouth 2 (two) times daily.    . Multiple Vitamins-Minerals (MEGA MULTIVITAMIN FOR MEN PO) Take 1 tablet by mouth daily.     . ondansetron (ZOFRAN) 4 MG tablet Take 1 tablet (4 mg total) every 8 (eight) hours as needed by mouth for nausea or vomiting. 20 tablet 0  . OXYGEN Inhale 2 L into the lungs continuous.    . predniSONE (STERAPRED UNI-PAK 21 TAB) 10 MG (21) TBPK tablet Take daily by mouth. Take 6 tabs by mouth daily  for 2 days, then 5 tabs for 2 days, then 4 tabs for 2 days, then 3 tabs for 2 days, 2 tabs for 2 days, then 1 tab by mouth daily for 2 days 42 tablet 0  . pregabalin (LYRICA) 50 MG capsule Take 50 mg by mouth 2 (two) times daily.    [name]  Semaglutide (OZEMPIC) 0.25 or 0.5 MG/DOSE SOPN Inject into the skin.    [name] sertraline (ZOLOFT) 100 MG tablet Take 50 mg by mouth daily.     [name] spironolactone (ALDACTONE) 25 MG tablet Take 0.5 tablets (12.5 mg total) by mouth daily. 90 tablet 3  . tamsulosin (FLOMAX) 0.4 MG CAPS capsule Take 2 capsules (0.8 mg total) by mouth at bedtime. 30 capsule 0  . tiotropium (SPIRIVA HANDIHALER) 18 MCG inhalation capsule Place 18 mcg into inhaler and inhale daily.       No current facility-administered medications for this visit.      Past Medical History:  Diagnosis Date  . Anxiety   . BENIGN PROSTATIC HYPERTROPHY 11/23/2009  . Cardiomyopathy (HCC) 08/28/2016  . COPD (chronic obstructive pulmonary disease) (HCC)   . CORONARY ARTERY DISEASE 11/23/2009  . 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 DEGENERATIVE JOINT DISEASE 11/23/2009  . DEPRESSION 11/23/2009  .  FATIGUE 11/23/2009  . GAIT DISTURBANCE 12/10/2009  . HEMOPTYSIS UNSPECIFIED 05/07/2010  . High cholesterol   . HYPERTENSION 07/30/2009  . HYPOTHYROIDISM 07/30/2009  . 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 On home oxygen therapy    "2-3L; 24/7" (09/10/2016)  . OSA on CPAP   . PTSD (post-traumatic stress disorder) 03/10/2012  . 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 RA (rheumatoid arthritis) (HCC) 06/11/2011   "qwhere" (09/10/2016)  . RESPIRATORY FAILURE, CHRONIC 07/31/2009  . Scleritis of both eyes 03/17/2014  . 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 Type II diabetes mellitus (HCC)     Past Surgical History:  Procedure Laterality Date  . 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 ABDOMINAL EXPLORATION SURGERY  02/2004   w/LOA/notes 12/10/2010; small bowel obstruction repair with adhesiolysis   . BACK SURGERY    . CARDIAC CATHETERIZATION     2 heart caths in the past.  One in 2000s showed one ulcerated plaque  Rx medically; Second at [facility] [name] 09/05/2016  . CATARACT EXTRACTION W/ INTRAOCULAR LENS  IMPLANT, BILATERAL Bilateral 2000s  . COLECTOMY     hx of remote ileum resection due to bleeding  . CORONARY ANGIOPLASTY WITH STENT PLACEMENT  09/10/2016  . CORONARY STENT INTERVENTION N/A 09/10/2016   Procedure: Coronary Stent Intervention;  Surgeon: Kathleene Hazel, MD;  Location: [facility] INVASIVE CV LAB;  Service: Cardiovascular;  Laterality: N/A;  Distal RCA 4.0x16 [employer]  . FEMORAL EMBOLOECTOMY Left 07/2000   with left leg ischemia; Dr. Hart Rochester, vascular  . GANGLION CYST EXCISION Right    "wrist"; Dr. Teressa Senter  . LUMBAR LAMINECTOMY  1972   Dr. Fannie Knee  . RIGHT/LEFT HEART CATH AND CORONARY ANGIOGRAPHY N/A 09/10/2016   Procedure: Right/Left Heart Cath and Coronary Angiography;  Surgeon: Kathleene Hazel, MD;  Location: [facility] INVASIVE CV LAB;  Service: Cardiovascular;  Laterality: N/A;  . TONSILLECTOMY      Social History   Socioeconomic History  . Marital status: Married    Spouse name: Not  on file  . Number of children: Not on file  . Years of education: Not on file  . Highest education level: Not on file  Social Needs  . Financial resource strain: Not on file  . Food insecurity - worry: Not on file  . Food insecurity - inability: Not on file  . Transportation needs - medical: Not on file  . Transportation needs - non-medical: Not on file  Occupational History  . Occupation: disabled Cytogeneticist, Ex Neurosurgeon: RETIRED  Tobacco Use  . Smoking  status: Former Smoker    Packs/day: 2.50    Years: 40.00    Pack years: 100.00    Types: Cigarettes, Pipe, Cigars    Last attempt to quit: 07/28/1998    Years since quitting: 18.9  . Smokeless tobacco: Never Used  Substance and Sexual Activity  . Alcohol use: No    Alcohol/week: 0.0 oz  . Drug use: No  . Sexual activity: Not on file  Other Topics Concern  . Not on file  Social History Narrative   Lives in the home with his wife   Sees [place] every 6 month for meds.   Denies asbestos exposure    Family History  Problem Relation Age of Onset  . Other Mother        gun shot    ROS: weight loss but no fevers or chills, productive cough, hemoptysis, dysphasia, odynophagia, melena, hematochezia, dysuria, hematuria, rash, seizure activity, orthopnea, PND, pedal edema, claudication. Remaining systems are negative.  Physical Exam: Well-developed well-nourished in no acute distress.  Skin is warm and dry.  HEENT is normal.  Neck is supple.  Chest diminished BS throughout Cardiovascular exam is regular rate and rhythm.  Abdominal exam nontender or distended. No masses palpated. Extremities show no edema. neuro grossly intact   A/P  1 coronary artery disease-patient doing well with no chest pain or dyspnea. Plan continue medical therapy. Continue aspirin, Plavix and statin. Will discontinue Plavix at the end of February as it will have been 1 year since PCI.  2 carotid artery disease-continue aspirin and statin.  Plan follow-up carotid Dopplers January 2019.   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  4 ischemic cardiomyopathy-continue ARB and beta blocker.  5 hypertension-blood pressure is controlled. Continue present medications.  6 hyperlipidemia-continue statin.  7 chronic systolic congestive heart failure-doing well from a volume status. Continue present dose of diuretic. Continue low sodium diet and fluid restriction to 2 L.  Olga Millers, MD

## 2017-06-12 ENCOUNTER — Ambulatory Visit (HOSPITAL_BASED_OUTPATIENT_CLINIC_OR_DEPARTMENT_OTHER)
Admission: RE | Admit: 2017-06-12 | Discharge: 2017-06-12 | Disposition: A | Discharge status: Home / Self Care | Payer: Medicare Other | Source: Ambulatory Visit | Attending: Adult Health | Admitting: Adult Health

## 2017-06-12 DIAGNOSIS — R918 Other nonspecific abnormal finding of lung field: Secondary | ICD-10-CM | POA: Insufficient documentation

## 2017-06-12 DIAGNOSIS — J439 Emphysema, unspecified: Secondary | ICD-10-CM | POA: Insufficient documentation

## 2017-06-12 DIAGNOSIS — I700 Atherosclerosis of aorta: Secondary | ICD-10-CM | POA: Diagnosis not present

## 2017-06-12 DIAGNOSIS — I719 Aortic aneurysm of unspecified site, without rupture: Secondary | ICD-10-CM | POA: Diagnosis not present

## 2017-06-12 NOTE — Progress Notes (Signed)
Reviewed & agree with plan  

## 2017-06-15 ENCOUNTER — Other Ambulatory Visit: Payer: Self-pay

## 2017-06-15 ENCOUNTER — Emergency Department (HOSPITAL_BASED_OUTPATIENT_CLINIC_OR_DEPARTMENT_OTHER)
Admission: EM | Admit: 2017-06-15 | Discharge: 2017-06-15 | Disposition: A | Discharge status: Home / Self Care | Payer: Medicare Other | Attending: Emergency Medicine | Admitting: Emergency Medicine

## 2017-06-15 ENCOUNTER — Encounter (HOSPITAL_BASED_OUTPATIENT_CLINIC_OR_DEPARTMENT_OTHER): Payer: Self-pay | Admitting: *Deleted

## 2017-06-15 ENCOUNTER — Telehealth: Payer: Self-pay | Admitting: Pulmonary Disease

## 2017-06-15 ENCOUNTER — Emergency Department (HOSPITAL_BASED_OUTPATIENT_CLINIC_OR_DEPARTMENT_OTHER): Payer: Medicare Other

## 2017-06-15 DIAGNOSIS — E119 Type 2 diabetes mellitus without complications: Secondary | ICD-10-CM | POA: Diagnosis not present

## 2017-06-15 DIAGNOSIS — I5043 Acute on chronic combined systolic (congestive) and diastolic (congestive) heart failure: Secondary | ICD-10-CM | POA: Insufficient documentation

## 2017-06-15 DIAGNOSIS — J449 Chronic obstructive pulmonary disease, unspecified: Secondary | ICD-10-CM | POA: Diagnosis not present

## 2017-06-15 DIAGNOSIS — Z7902 Long term (current) use of antithrombotics/antiplatelets: Secondary | ICD-10-CM | POA: Diagnosis not present

## 2017-06-15 DIAGNOSIS — Z87891 Personal history of nicotine dependence: Secondary | ICD-10-CM | POA: Diagnosis not present

## 2017-06-15 DIAGNOSIS — Z794 Long term (current) use of insulin: Secondary | ICD-10-CM | POA: Insufficient documentation

## 2017-06-15 DIAGNOSIS — J189 Pneumonia, unspecified organism: Secondary | ICD-10-CM | POA: Diagnosis not present

## 2017-06-15 DIAGNOSIS — Z955 Presence of coronary angioplasty implant and graft: Secondary | ICD-10-CM | POA: Diagnosis not present

## 2017-06-15 DIAGNOSIS — I25110 Atherosclerotic heart disease of native coronary artery with unstable angina pectoris: Secondary | ICD-10-CM | POA: Insufficient documentation

## 2017-06-15 DIAGNOSIS — I110 Hypertensive heart disease with heart failure: Secondary | ICD-10-CM | POA: Diagnosis not present

## 2017-06-15 DIAGNOSIS — E039 Hypothyroidism, unspecified: Secondary | ICD-10-CM | POA: Insufficient documentation

## 2017-06-15 DIAGNOSIS — Z7982 Long term (current) use of aspirin: Secondary | ICD-10-CM | POA: Diagnosis not present

## 2017-06-15 DIAGNOSIS — R531 Weakness: Secondary | ICD-10-CM | POA: Diagnosis present

## 2017-06-15 LAB — CBC WITH DIFFERENTIAL/PLATELET
BASOS ABS: 0.1 10*3/uL (ref 0.0–0.1)
Basophils Relative: 1 %
Eosinophils Absolute: 0.7 10*3/uL (ref 0.0–0.7)
Eosinophils Relative: 8 %
HEMATOCRIT: 33.8 % — AB (ref 39.0–52.0)
HEMOGLOBIN: 11 g/dL — AB (ref 13.0–17.0)
LYMPHS PCT: 16 %
Lymphs Abs: 1.5 10*3/uL (ref 0.7–4.0)
MCH: 27 pg (ref 26.0–34.0)
MCHC: 32.5 g/dL (ref 30.0–36.0)
MCV: 82.8 fL (ref 78.0–100.0)
MONO ABS: 1.1 10*3/uL — AB (ref 0.1–1.0)
Monocytes Relative: 12 %
NEUTROS ABS: 6 10*3/uL (ref 1.7–7.7)
NEUTROS PCT: 63 %
Platelets: 194 10*3/uL (ref 150–400)
RBC: 4.08 MIL/uL — ABNORMAL LOW (ref 4.22–5.81)
RDW: 14.6 % (ref 11.5–15.5)
WBC: 9.4 10*3/uL (ref 4.0–10.5)

## 2017-06-15 LAB — URINALYSIS, ROUTINE W REFLEX MICROSCOPIC
BILIRUBIN URINE: NEGATIVE
GLUCOSE, UA: NEGATIVE mg/dL
HGB URINE DIPSTICK: NEGATIVE
Ketones, ur: NEGATIVE mg/dL
Leukocytes, UA: NEGATIVE
Nitrite: NEGATIVE
PROTEIN: NEGATIVE mg/dL
Specific Gravity, Urine: 1.015 (ref 1.005–1.030)
pH: 6 (ref 5.0–8.0)

## 2017-06-15 LAB — COMPREHENSIVE METABOLIC PANEL
ALBUMIN: 3.7 g/dL (ref 3.5–5.0)
ALK PHOS: 61 U/L (ref 38–126)
ALT: 19 U/L (ref 17–63)
AST: 19 U/L (ref 15–41)
Anion gap: 7 (ref 5–15)
BILIRUBIN TOTAL: 0.8 mg/dL (ref 0.3–1.2)
BUN: 24 mg/dL — AB (ref 6–20)
CALCIUM: 9.3 mg/dL (ref 8.9–10.3)
CO2: 26 mmol/L (ref 22–32)
CREATININE: 1.06 mg/dL (ref 0.61–1.24)
Chloride: 100 mmol/L — ABNORMAL LOW (ref 101–111)
GFR calc Af Amer: >60 mL/min (ref >60)
GLUCOSE: 196 mg/dL — AB (ref 65–99)
Potassium: 4.4 mmol/L (ref 3.5–5.1)
Sodium: 133 mmol/L — ABNORMAL LOW (ref 135–145)
TOTAL PROTEIN: 7.2 g/dL (ref 6.5–8.1)

## 2017-06-15 MED ORDER — LEVOFLOXACIN 500 MG PO TABS: 500.0000 mg | ORAL_TABLET | Freq: Every day | ORAL | 0 refills | Status: DC

## 2017-06-15 MED ORDER — SODIUM CHLORIDE 0.9 % IV BOLUS (SEPSIS)
500.0000 mL | Freq: Once | INTRAVENOUS | Status: AC
  Administered 2017-06-15: 500 mL via INTRAVENOUS

## 2017-06-15 MED ORDER — PREDNISONE 10 MG (21) PO TBPK: ORAL_TABLET | Freq: Every day | ORAL | 0 refills | Status: DC

## 2017-06-15 MED ORDER — METHYLPREDNISOLONE SODIUM SUCC 125 MG IJ SOLR
125.0000 mg | Freq: Once | INTRAMUSCULAR | Status: AC
  Administered 2017-06-15: 125 mg via INTRAVENOUS
  Filled 2017-06-15: qty 2

## 2017-06-15 MED ORDER — PIPERACILLIN-TAZOBACTAM 3.375 G IVPB 30 MIN
3.3750 g | Freq: Once | INTRAVENOUS | Status: AC
  Administered 2017-06-15: 3.375 g via INTRAVENOUS
  Filled 2017-06-15 (×2): qty 50

## 2017-06-15 MED ORDER — VANCOMYCIN HCL IN DEXTROSE 1-5 GM/200ML-% IV SOLN
1000.0000 mg | Freq: Once | INTRAVENOUS | Status: AC
  Administered 2017-06-15: 1000 mg via INTRAVENOUS
  Filled 2017-06-15: qty 200

## 2017-06-15 MED FILL — levoFLOXacin 500 MG TABS: 500 | 7 days supply | Qty: 7 | Fill #0

## 2017-06-15 MED FILL — predniSONE 10 MG TABS: 10 | 12 days supply | Qty: 42 | Fill #0

## 2017-06-15 NOTE — ED Triage Notes (Addendum)
Weakness. He was in his doctors office 4 days ago and they want to do a biopsy on a mass in his left lung. it has not been scheduled. Weight loss for over a month. No appetite. Diarrhea off and on x 2 months. He had lab work but never heard back from his MD with results. States he is taking probiotics and metamucil. He is pale at triage. He uses home oxygen 2l/m [place] for COPD.

## 2017-06-15 NOTE — ED Notes (Signed)
Placed a third call to Dr. Vassie Loll

## 2017-06-15 NOTE — Discharge Instructions (Signed)
Stop current steroids and take new dose pack.  Take Levaquin as prescribed.  Return here if you change your mind about being admitted at any time.  Call your doctor's office in the morning for follow-up as soon as possible

## 2017-06-15 NOTE — ED Notes (Signed)
ED Provider at bedside. 

## 2017-06-15 NOTE — Telephone Encounter (Signed)
rx was sent to pharmacy at [facility] ED. Called and spoke with spouse who verified recs.  Scheduled ROV with RA later this week- pt's spouse requested pt to be seen this week.  Nothing further needed.

## 2017-06-15 NOTE — ED Provider Notes (Signed)
[facility] EMERGENCY DEPARTMENT Provider Note   CSN: [id number] Arrival date & time: 06/15/17  1123     History   Chief Complaint Chief Complaint  Patient presents with  . Weakness    HPI [name] is a 76 y.o. male.  HPI 76 year old man with stage III COPD, chronic respiratory failure, rheumatoid arthritis, diabetes, CHF, CAD status post stent now with new left upper lobe mass presents today complaining of weight loss and increased weakness over the past month.  Patient states his appetite has been poor and he has lost approximately 30 pounds.  He denies vomiting but states he has been nauseated.  He reports normal bowel movements.  He is the caregiver for his wife who is end-stage COPD.  He reports generalized malaise and fatigue But denies any fever or chills.  He has had cough but states that this has been stable for his COPD.  He denies any recent change in sputum production. Review of records from pulmonary reveal that they have been treating a left upper lobe mass for several months.  He returned to their office last week with increased cough and congestion and was started on doxycycline twice a day for 1 week on 1115.  They have planned a CT chest for this month and he may require needle biopsy Review of chest CT 1115 revealed 2.4 x 3.5 cm subpleural opacity in the anterior left upper lobe brain scarring with new multifocal patchy nodularity opacities in the bilateral upper lobes and left lower lobe favoring multifocal pneumonia. Past Medical History:  Diagnosis Date  . Anxiety   . BENIGN PROSTATIC HYPERTROPHY 11/23/2009  . Cardiomyopathy (HCC) 08/28/2016  . COPD (chronic obstructive pulmonary disease) (HCC)   . CORONARY ARTERY DISEASE 11/23/2009  . 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 DEGENERATIVE JOINT DISEASE 11/23/2009  . DEPRESSION 11/23/2009  . FATIGUE 11/23/2009  . GAIT DISTURBANCE 12/10/2009  . HEMOPTYSIS UNSPECIFIED 05/07/2010  . High cholesterol     . HYPERTENSION 07/30/2009  . HYPOTHYROIDISM 07/30/2009  . 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 On home oxygen therapy    "2-3L; 24/7" (09/10/2016)  . OSA on CPAP   . PTSD (post-traumatic stress disorder) 03/10/2012  . 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 RA (rheumatoid arthritis) (HCC) 06/11/2011   "qwhere" (09/10/2016)  . RESPIRATORY FAILURE, CHRONIC 07/31/2009  . Scleritis of both eyes 03/17/2014  . 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 Type II diabetes mellitus [facility])     Patient Active Problem List   Diagnosis Date Noted  . Lung mass 06/11/2017  . Sepsis (HCC) 12/25/2016  . Acute on chronic combined systolic and diastolic CHF (congestive heart failure) (HCC) 09/11/2016  . Unstable angina (HCC)   . Cardiomyopathy (HCC) 08/28/2016  . Urinary urgency 07/10/2016  . Shoulder arthritis 06/12/2016  . Polyarthralgia 04/04/2016  . 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 Corn of foot 10/02/2015  . Post-nasal drip 12/11/2014  . COPD, severe (HCC) 08/04/2014  . Dysuria 05/04/2014  . Scleritis of both eyes 03/17/2014  . Hyperlipidemia 01/05/2014  . Ileus (HCC) 08/22/2013  . Community acquired pneumonia 07/19/2013  . COPD exacerbation (HCC) 05/31/2012  . Obesity 04/20/2012  . PTSD (post-traumatic stress disorder) 03/10/2012  . Orthostatic hypotension 02/02/2012  . Syncope 01/26/2012  . RA (rheumatoid arthritis) (HCC) 06/11/2011  . OSA on CPAP 06/10/2011  . LBBB (left bundle branch block) 03/25/2011  . Diastolic dysfunction 02/25/2011  . Insomnia 12/05/2010  . Erectile dysfunction 12/05/2010  . Preventative health care 12/04/2010  .  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 GAIT DISTURBANCE 12/10/2009  . Diabetes (HCC) 11/23/2009  . Depression 11/23/2009  . Coronary atherosclerosis 11/23/2009  . BENIGN PROSTATIC HYPERTROPHY 11/23/2009  . DEGENERATIVE JOINT DISEASE 11/23/2009  . FATIGUE 11/23/2009  . 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 RESPIRATORY FAILURE, CHRONIC  07/31/2009  . Hypothyroidism 07/30/2009  . Essential hypertension 07/30/2009  . COPD (chronic obstructive pulmonary disease) (HCC) 07/30/2009    Past Surgical History:  Procedure Laterality Date  . 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 ABDOMINAL EXPLORATION SURGERY  02/2004   w/LOA/notes 12/10/2010; small bowel obstruction repair with adhesiolysis   . BACK SURGERY    . CARDIAC CATHETERIZATION     2 heart caths in the past.  One in 2000s showed one ulcerated plaque  Rx medically; Second at [facility] [name] 09/05/2016  . CATARACT EXTRACTION W/ INTRAOCULAR LENS  IMPLANT, BILATERAL Bilateral 2000s  . COLECTOMY     hx of remote ileum resection due to bleeding  . CORONARY ANGIOPLASTY WITH STENT PLACEMENT  09/10/2016  . Coronary Stent Intervention N/A 09/10/2016   Performed by Kathleene Hazel, MD at [facility] INVASIVE CV LAB  . FEMORAL EMBOLOECTOMY Left 07/2000   with left leg ischemia; Dr. Hart Rochester, vascular  . GANGLION CYST EXCISION Right    "wrist"; Dr. Teressa Senter  . LUMBAR LAMINECTOMY  1972   Dr. Fannie Knee  . Right/Left Heart Cath and Coronary Angiography N/A 09/10/2016   Performed by Kathleene Hazel, MD at [facility] INVASIVE CV LAB  . TONSILLECTOMY         Home Medications    Prior to Admission medications   Medication Sig Start Date End Date Taking? Authorizing Provider  acetaminophen (TYLENOL) 325 MG tablet Take 500 mg daily as needed by mouth for moderate pain or headache.     [provider]  albuterol (PROAIR HFA) 108 (90 BASE) MCG/ACT inhaler Inhale 2 puffs into the lungs every 4 (four) hours as needed. Shortness of breath 10/09/11   Kalman Shan, MD  albuterol (PROVENTIL) (2.5 MG/3ML) 0.083% nebulizer solution 1 vial in nebulizer every 6 hours as needed Dx 496 05/31/12   Kalman Shan, MD  aspirin EC 81 MG tablet Take 1 tablet (81 mg total) by mouth daily. 09/17/16   Bhagat, Sharrell Ku, PA  atorvastatin (LIPITOR) 40 MG tablet Take 1 tablet (40 mg  total) by mouth daily. 10/21/16   Pricilla Riffle, MD  bisoprolol (ZEBETA) 5 MG tablet Take 1 tablet (5 mg total) by mouth daily. 09/11/16   Strader, Lennart Pall, PA-C  Carboxymethylcellul-Glycerin (LUBRICATING EYE DROPS OP) Apply 1 drop to eye daily as needed (dry eyes).    [provider]  Cholecalciferol (VITAMIN D3) 2000 units capsule Take 2,000 Units by mouth daily.  01/11/16   [provider]  clopidogrel (PLAVIX) 75 MG tablet Take 1 tablet (75 mg total) by mouth daily with breakfast. 09/11/16   [place] Ouch, Lennart Pall, PA-C  etanercept (ENBREL SURECLICK) 50 MG/ML injection Inject 50 mg into the skin once a week.    [provider]  furosemide (LASIX) 40 MG tablet TK 1 T PO D PRN FOR FLUID OR EDEMA 09/11/16   [provider]  gabapentin (NEURONTIN) 800 MG tablet Take 800 mg by mouth 2 (two) times daily.      [provider]  insulin glargine (LANTUS) 100 UNIT/ML injection Inject 0.55 mLs (55 Units total) into the skin at bedtime. 12/28/16   Roda Shutters,  Parke Poisson, MD  levothyroxine (SYNTHROID, LEVOTHROID) 200 MCG tablet Take 200 mcg by mouth daily before breakfast.    [provider]  losartan (COZAAR) 50 MG tablet Take 1 tablet (50 mg total) by mouth daily. 08/24/13   Zannie Cove, MD  metFORMIN (GLUCOPHAGE) 1000 MG tablet Take 750 mg 2 (two) times daily by mouth.     [provider]  Multiple Vitamins-Minerals (MEGA MULTIVITAMIN FOR MEN PO) Take 1 tablet by mouth daily.     [provider]  ondansetron (ZOFRAN) 4 MG tablet Take 1 tablet (4 mg total) every 8 (eight) hours as needed by mouth for nausea or vomiting. 06/03/17   Sharlene Dory, DO  OXYGEN Inhale 2 L into the lungs continuous.    [provider]  predniSONE (DELTASONE) 10 MG tablet 4 tabs for 2 days, then 3 tabs for 2 days, 2 tabs for 2 days, then 1 tab for 2 days, then stop 06/11/17   Parrett, Tammy S, NP  Semaglutide (OZEMPIC) 0.25 or 0.5 MG/DOSE SOPN Inject into the  skin.    [provider]  sertraline (ZOLOFT) 100 MG tablet Take 50 mg by mouth daily.     [provider]  spironolactone (ALDACTONE) 25 MG tablet Take 0.5 tablets (12.5 mg total) by mouth daily. 12/31/16   Sharlene Dory, DO  tamsulosin (FLOMAX) 0.4 MG CAPS capsule Take 2 capsules (0.8 mg total) by mouth at bedtime. 12/28/16   Albertine Grates, MD  tiotropium (SPIRIVA HANDIHALER) 18 MCG inhalation capsule Place 18 mcg into inhaler and inhale daily.      [provider]    Family History Family History  Problem Relation Age of Onset  . Other Mother        gun shot    Social History Social History   Tobacco Use  . Smoking status: Former Smoker    Packs/day: 2.50    Years: 40.00    Pack years: 100.00    Types: Cigarettes, Pipe, Cigars    Last attempt to quit: 07/28/1998    Years since quitting: 18.8  . Smokeless tobacco: Never Used  Substance Use Topics  . Alcohol use: No    Alcohol/week: 0.0 oz  . Drug use: No     Allergies   Patient has no known allergies.   Review of Systems Review of Systems   Physical Exam Updated Vital Signs BP 130/60   Pulse 77   Temp 98.5 F (36.9 C) (Oral)   Resp 18   Ht 1.88 m (6\' 2" )   Wt 96.2 kg (212 lb)   SpO2 91%   BMI 27.22 kg/m   Physical Exam  Constitutional: He is oriented to person, place, and time. He appears well-developed.  Chronically ill-appearing  HENT:  Head: Normocephalic and atraumatic.  Right Ear: External ear normal.  Left Ear: External ear normal.  Nose: Nose normal.  Mouth/Throat: Oropharynx is clear and moist.  Eyes: Pupils are equal, round, and reactive to light.  Conjunctivae pale  Neck: Normal range of motion. Neck supple.  Cardiovascular: Normal rate, regular rhythm and normal heart sounds.  Pulmonary/Chest:  Decreased breath sounds throughout but no rhonchi or wheezes  Abdominal: Soft. Bowel sounds are normal.  Musculoskeletal: Normal range of motion. He exhibits no edema.   Neurological: He is alert and oriented to person, place, and time.  Skin: Skin is warm. Capillary refill takes less than 2 seconds. There is pallor.  Psychiatric: He has a normal mood and affect.  Nursing note and vitals reviewed.    ED Treatments / Results  Labs (all labs ordered are listed, but only abnormal results are displayed) Labs Reviewed  CBC WITH DIFFERENTIAL/PLATELET  COMPREHENSIVE METABOLIC PANEL  URINALYSIS, ROUTINE W REFLEX MICROSCOPIC    EKG  EKG Interpretation  Date/Time:  Monday June 15 2017 11:51:10 EST Ventricular Rate:  71 PR Interval:    QRS Duration: 138 QT Interval:  422 QTC Calculation: 459 R Axis:   -51 Text Interpretation:  Normal sinus rhythm Left bundle branch block No significant change since last tracing 25 Dec 2016 Confirmed by Margarita Grizzle [phone]) on 06/15/2017 12:42:37 PM       Radiology No results found.  Procedures Procedures (including critical care time)  Medications Ordered in ED Medications  sodium chloride 0.9 % bolus 500 mL (not administered)     Initial Impression / Assessment and Plan / ED Course  I have reviewed the triage vital signs and the nursing notes.  Pertinent labs & imaging results that were available during my care of the patient were reviewed by me and considered in my medical decision making (see chart for details).     76 year old man COPD multilobar pneumonia seen on CT 11/16 presents today with dyspnea and generalized weakness.  He has been being treated outpatient with doxycycline.  Here he was given Zosyn and vancomycin in anticipation of hospital admission.  However, patient refuses admission.  I have counseled him and risks.  His wife is end-stage COPD and he is the primary caregiver.  Patient was last treated with doxycycline p.o. twice daily for 1 week completing 11/15 He is on prednisone taper. I have attempted to contact pulmonary.  Will increase prednisone and re-taper and put patient on  Levaquin.  He is advised to come back any time that he feels worse or changes his mind about staying  Final Clinical Impressions(s) / ED Diagnoses   Final diagnoses:  Community acquired pneumonia, unspecified laterality  Chronic obstructive pulmonary disease, unspecified COPD type [facility])    ED Discharge Orders    None       Margarita Grizzle, MD 06/15/17 1616

## 2017-06-15 NOTE — Telephone Encounter (Signed)
Spoke with Dr. Eudelia Bunch at [facility] ED, states pt was seen there today for CAP but declined admission d/t his wife being sick.  Pt was put on Levaquin and a pred taper starting at 60mg - Dr. is requesting RA call him to further discuss this and see if anything further is needed.    Dr. Eudelia Bunch callback number is [phone].  RA please advise.  Thanks.

## 2017-06-15 NOTE — Telephone Encounter (Signed)
Discussed with the ED doc. Please call the patient confirmed that he has a prescription for Levaquin and prednisone to take. These arrange follow-up visit in 5-7 days with chest x-ray with me

## 2017-06-17 ENCOUNTER — Encounter: Payer: Self-pay | Admitting: Pulmonary Disease

## 2017-06-17 ENCOUNTER — Ambulatory Visit: Payer: Medicare Other | Admitting: Pulmonary Disease

## 2017-06-17 DIAGNOSIS — J441 Chronic obstructive pulmonary disease with (acute) exacerbation: Secondary | ICD-10-CM

## 2017-06-17 DIAGNOSIS — R918 Other nonspecific abnormal finding of lung field: Secondary | ICD-10-CM

## 2017-06-17 NOTE — Assessment & Plan Note (Signed)
Stop taking Enbrel for 4 weeks until he recovers from current infection  Call us again if sputum changes color and we will consider bronchoscopy

## 2017-06-17 NOTE — Assessment & Plan Note (Signed)
  Platelike atelectasis/scarring left upper lobe -cannot rule out mass but favor benign etiology since it seemed to come up in 11/2016 and prior imaging does not demonstrate  Repeat CT chest in 3-4 months, if persistent will consider bronchoscopy

## 2017-06-17 NOTE — Patient Instructions (Signed)
Complete course of Levaquin.  Drop prednisone to 20 mg-2 tablets, until you finish the tablets you have.  Stay on Spiriva and Striverdi  Stop taking Enbrel for 4 weeks until he recovers from current infection  Call us again if sputum changes color and we will consider bronchoscopy

## 2017-06-17 NOTE — Progress Notes (Signed)
Subjective:    Patient ID: [name], male    DOB: 30-Dec-1940, 76 y.o.   MRN: [id number]  HPI  76 year old male former smoker with gold D COPD, chronic respiratory failure on oxygen Has rheumatoid arthritis and diabetes He had cardiac cath with PCI to RCA and is on Plavix since 08/2016  He was treated for left upper lobe pneumonia in June 2018, this seemed to resolve but there was some residual density in the left upper lobe. PET scan showed hypermetabolic left upper lobe platelike density SUV 5.5  Review of imaging  chest x-ray 5/31  first demonstrates left upper lobe infiltrate,  his chest x-ray from 10/2016 does not show this infiltrate  He was again in the ER 11/19, labs and chest x-ray reviewed, WBC count normal.  CT chest from 11/15 shows slight decrease in platelike infiltrate in the left upper lobe.  He had yellow sputum which is now resolved.  He was given Levaquin and prednisone taper which increases her sugars.  He complains of loose stool to the point of requiring depends due to couple of episodes of incontinence   He is compliant with Striverdi and Spiriva - gets this from the [place]  His wife who is also her patient is now on hospice He has been struggling with some weight loss   discussed his case and imaging with NP who has been comanaging and also ER physician  Significant tests/ events reviewed  PSG 04/2014 showed severe OSA, AHI 48/h with nadir desatn 78% correctd by CPAP 12 cm, 3 L O2 blended in , c flex +2 cm, humidity, A medium full face mask was used.  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  PFT 10/2015 FEV1 was 36%, ratio 46, FVC 58%, DLCO 26%   CT chest 12/2016 -masslike consolidation in LUL  CT chest 03/12/17 >stable masslike consolidation LUL  3.9cm , LUL 7 mm nodule  PET scan 03/20/17 >+metabolic act in LUL consolidation   CT chest 05/2017 >> 2.4 x 3.5 cm triangular subpleural opacity in the anterior left upper lobe, stable versus mildly decreased, favoring scarring -Bilateral multifocal opacities   Past Medical History:  Diagnosis Date  . Anxiety   . BENIGN PROSTATIC HYPERTROPHY 11/23/2009  . Cardiomyopathy (HCC) 08/28/2016  . COPD (chronic obstructive pulmonary disease) (HCC)   . CORONARY ARTERY DISEASE 11/23/2009  . 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 DEGENERATIVE JOINT DISEASE 11/23/2009  . DEPRESSION 11/23/2009  . FATIGUE 11/23/2009  . GAIT DISTURBANCE 12/10/2009  . HEMOPTYSIS UNSPECIFIED 05/07/2010  . High cholesterol   . HYPERTENSION 07/30/2009  . HYPOTHYROIDISM 07/30/2009  . 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 On home oxygen therapy    "2-3L; 24/7" (09/10/2016)  . OSA on CPAP   . PTSD (post-traumatic stress disorder) 03/10/2012  . 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 RA (rheumatoid arthritis) (HCC) 06/11/2011   "qwhere" (09/10/2016)  . RESPIRATORY FAILURE, CHRONIC 07/31/2009  . Scleritis of both eyes 03/17/2014  . 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 Type II diabetes mellitus (HCC)      Review of Systems neg for any significant sore throat, dysphagia, itching, sneezing, nasal congestion or excess/ purulent secretions, fever, chills, sweats, unintended wt loss, pleuritic or exertional cp, hempoptysis, orthopnea pnd or change in chronic leg swelling. Also denies presyncope, palpitations, heartburn, abdominal pain,  nausea, vomiting, diarrhea or change in bowel or urinary habits, dysuria,hematuria, rash, arthralgias, visual complaints, headache, numbness weakness or ataxia.     Objective:   Physical Exam   Gen. Pleasant, well-nourished, in no distress, on [facility] - no thrush, no post nasal drip Neck: No JVD, no thyromegaly, no carotid bruits Lungs: no use of accessory muscles, no dullness to percussion,decreased BL without rales or  rhonchi  Cardiovascular: Rhythm regular, heart sounds  normal, no murmurs or gallops, no peripheral edema Musculoskeletal: No deformities, no cyanosis or clubbing         Assessment & Plan:

## 2017-06-17 NOTE — Assessment & Plan Note (Signed)
Complete course of Levaquin.  Drop prednisone to 20 mg-2 tablets, until you finish the tablets you have.  Stay on Spiriva and Striverdi

## 2017-06-20 LAB — CULTURE, BLOOD (ROUTINE X 2)
CULTURE: NO GROWTH
Culture: NO GROWTH
Special Requests: ADEQUATE
Special Requests: ADEQUATE

## 2017-06-23 ENCOUNTER — Encounter: Payer: Self-pay | Admitting: Cardiology

## 2017-06-23 ENCOUNTER — Ambulatory Visit: Payer: Medicare Other | Admitting: Cardiology

## 2017-06-23 VITALS — BP 128/66 | HR 68 | Ht 74.0 in | Wt 211.0 lb

## 2017-06-23 DIAGNOSIS — I723 Aneurysm of iliac artery: Secondary | ICD-10-CM | POA: Diagnosis not present

## 2017-06-23 DIAGNOSIS — E7800 Pure hypercholesterolemia, unspecified: Secondary | ICD-10-CM | POA: Diagnosis not present

## 2017-06-23 DIAGNOSIS — I2510 Atherosclerotic heart disease of native coronary artery without angina pectoris: Secondary | ICD-10-CM

## 2017-06-23 DIAGNOSIS — I739 Peripheral vascular disease, unspecified: Secondary | ICD-10-CM

## 2017-06-23 DIAGNOSIS — I712 Thoracic aortic aneurysm, without rupture, unspecified: Secondary | ICD-10-CM

## 2017-06-23 DIAGNOSIS — I779 Disorder of arteries and arterioles, unspecified: Secondary | ICD-10-CM

## 2017-06-23 DIAGNOSIS — I10 Essential (primary) hypertension: Secondary | ICD-10-CM | POA: Diagnosis not present

## 2017-06-23 NOTE — Patient Instructions (Signed)
Medication Instructions:   STOP PLAVIX AT THE END OF FEBRUARY   Follow-Up:  Your physician recommends that you schedule a follow-up appointment in: AS SCHEDULED

## 2017-07-16 ENCOUNTER — Encounter: Payer: Self-pay | Admitting: Adult Health

## 2017-07-16 ENCOUNTER — Ambulatory Visit (HOSPITAL_BASED_OUTPATIENT_CLINIC_OR_DEPARTMENT_OTHER)
Admission: RE | Admit: 2017-07-16 | Discharge: 2017-07-16 | Disposition: A | Discharge status: Home / Self Care | Payer: Medicare Other | Source: Ambulatory Visit | Attending: Adult Health | Admitting: Adult Health

## 2017-07-16 ENCOUNTER — Ambulatory Visit (INDEPENDENT_AMBULATORY_CARE_PROVIDER_SITE_OTHER): Payer: Medicare Other | Admitting: Adult Health

## 2017-07-16 VITALS — BP 136/62 | HR 89

## 2017-07-16 DIAGNOSIS — Z9989 Dependence on other enabling machines and devices: Secondary | ICD-10-CM | POA: Diagnosis not present

## 2017-07-16 DIAGNOSIS — J9611 Chronic respiratory failure with hypoxia: Secondary | ICD-10-CM

## 2017-07-16 DIAGNOSIS — J690 Pneumonitis due to inhalation of food and vomit: Secondary | ICD-10-CM

## 2017-07-16 DIAGNOSIS — J449 Chronic obstructive pulmonary disease, unspecified: Secondary | ICD-10-CM

## 2017-07-16 DIAGNOSIS — R918 Other nonspecific abnormal finding of lung field: Secondary | ICD-10-CM | POA: Insufficient documentation

## 2017-07-16 DIAGNOSIS — G4733 Obstructive sleep apnea (adult) (pediatric): Secondary | ICD-10-CM | POA: Diagnosis not present

## 2017-07-16 MED ORDER — TIOTROPIUM BROMIDE MONOHYDRATE 18 MCG IN CAPS: 18.0000 ug | ORAL_CAPSULE | Freq: Every day | RESPIRATORY_TRACT | 4 refills | Status: DC

## 2017-07-16 MED ORDER — PREDNISONE 10 MG PO TABS: ORAL_TABLET | ORAL | 1 refills | Status: DC

## 2017-07-16 NOTE — Addendum Note (Signed)
Addended by: Cornell Barman on: 07/16/2017 01:38 PM   Modules accepted: Orders

## 2017-07-16 NOTE — Assessment & Plan Note (Signed)
Stable on most recent CT chest  Will remain of ENBREL for now .  Check cxr today  Will need follow up CT chest going forward in few months .   Plan  . Patient Instructions  Begin Prednisone 20mg  daily for 1 week then 10mg  daily -hold at this dose until seen back in office  Continue on Striverdi daily .  Continue on Spiriva daily.  Continue on Oxygen 2l/m .  Continue on CPAP At bedtime  With oxygen .  Chest xray today .  Follow up with in 6 weeks Dr.  And As needed   Please contact office for sooner follow up if symptoms do not improve or worsen or seek emergency care

## 2017-07-16 NOTE — Assessment & Plan Note (Signed)
SEvere COPD , O2 dependent  Pt w/ increased sx off steroids . Will add back low dose until seen back in office in 4-6 weeks  Pt aware to monitor sugars closely on steroids and report >200.   Plan  Patient Instructions  Begin Prednisone 20mg  daily for 1 week then 10mg  daily -hold at this dose until seen back in office  Continue on Striverdi daily .  Continue on Spiriva daily.  Continue on Oxygen 2l/m .  Continue on CPAP At bedtime  With oxygen .  Chest xray today .  Follow up with in 6 weeks Dr.  And As needed   Please contact office for sooner follow up if symptoms do not improve or worsen or seek emergency care

## 2017-07-16 NOTE — Patient Instructions (Signed)
Begin Prednisone 20mg  daily for 1 week then 10mg  daily -hold at this dose until seen back in office  Continue on Striverdi daily .  Continue on Spiriva daily.  Continue on Oxygen 2l/m .  Continue on CPAP At bedtime  With oxygen .  Chest xray today .  Follow up with in 6 weeks Dr.  And As needed   Please contact office for sooner follow up if symptoms do not improve or worsen or seek emergency care

## 2017-07-16 NOTE — Assessment & Plan Note (Signed)
Cont on O2 .  

## 2017-07-16 NOTE — Assessment & Plan Note (Signed)
Cont on CPAP At bedtime  

## 2017-07-16 NOTE — Progress Notes (Signed)
@Patient  ID: [name], male    DOB: 11-27-1940, 76 y.o.   MRN: [id number]  Chief Complaint  Patient presents with  . 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  PFT 10/2015 FEV1 was 36%, ratio 46, FVC 58%, DLCO 26% CXR 11/2016 LUL PNA ,  CT chest 12/2016 -masslike consolidation in LUL  CT chest 03/12/17 >stable masslike consolidation LUL 3.9cm , LUL 7 mm nodule  PET scan 03/20/17 >+metabolic act in LUL consolidation    07/16/2017 Follow up ; COPD , O2 RF , LUL consolidation/mass  Pt returns for 1 month follow up . Pt has severe COPD on O2 . He is maintained on Striverdi and Spiriva . Says he has good and bad days , had recent exacerbation with associated PNA last ov . He is improved with decreased congestion but feels when he came off prednisone breathing has went back down hill. He gets winded with minimal activity . He is under a lot of stress and is very vocal today that taking care of wife that is in hospice is over whelming . He is worn out.  Denies fever, discolored mucus , increased edema.   He has had a presumed slow to resolve PNA w/ masslike consolidation in LUL  Since May 2018 . Due to his Severe COPD , O 2 dependence and multiple co morbidities it has been followed serially. Most recent CT chest last month showed stable to slightly decreased LUL opacity .  His ENBREL remains on hold . Wt is improving and starting to trend back up.   He has OSA on CPAP At bedtime  . Doing well . Feels rested.    No Known Allergies  Immunization History  Administered Date(s) Administered  . Influenza Split 04/14/2011, 04/27/2013  . Influenza Whole  05/28/2009, 04/27/2010, 03/31/2012  . Influenza, High Dose Seasonal PF 03/21/2015, 05/16/2017  . Influenza,inj,Quad PF,6+ Mos 03/17/2014, 04/04/2016  . Influenza-Unspecified 05/26/2011, 05/07/2012  . Pneumococcal Conjugate-13 09/20/2013  . Pneumococcal Polysaccharide-23 05/28/2008, 04/25/2010  . Pneumococcal-Unspecified 04/27/2013  . Td 11/23/2009  . Zoster 04/28/2009    Past Medical History:  Diagnosis Date  . Anxiety   . BENIGN PROSTATIC HYPERTROPHY 11/23/2009  . Cardiomyopathy (HCC) 08/28/2016  . COPD (chronic obstructive pulmonary disease) (HCC)   . CORONARY ARTERY DISEASE 11/23/2009  . 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 DEGENERATIVE JOINT DISEASE 11/23/2009  . DEPRESSION 11/23/2009  . FATIGUE 11/23/2009  . GAIT DISTURBANCE 12/10/2009  . HEMOPTYSIS UNSPECIFIED 05/07/2010  . High cholesterol   . HYPERTENSION 07/30/2009  . HYPOTHYROIDISM 07/30/2009  . 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 On home oxygen therapy    "2-3L; 24/7" (09/10/2016)  . OSA on CPAP   . PTSD (post-traumatic stress disorder) 03/10/2012  . 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 RA (rheumatoid arthritis) (HCC) 06/11/2011   "qwhere" (09/10/2016)  . RESPIRATORY FAILURE, CHRONIC 07/31/2009  . Scleritis of both eyes 03/17/2014  . 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 Type II diabetes mellitus (HCC)     Tobacco History: Social History   Tobacco Use  Smoking Status Former Smoker  . Packs/day: 2.50  . Years: 40.00  . Pack years: 100.00  .  Types: Cigarettes, Pipe, Cigars  . Last attempt to quit: 07/28/1998  . Years since quitting: 18.9  Smokeless Tobacco Never Used   Counseling given: Not Answered   Outpatient Encounter Medications as of 07/16/2017  Medication Sig  . acetaminophen (TYLENOL) 325 MG tablet Take 500 mg daily as needed by mouth for moderate pain or headache.   . albuterol (PROAIR HFA) 108 (90 BASE) MCG/ACT inhaler Inhale 2 puffs into the lungs every 4 (four) hours as needed. Shortness of breath  . albuterol  (PROVENTIL) (2.5 MG/3ML) 0.083% nebulizer solution 1 vial in nebulizer every 6 hours as needed Dx 496  . aspirin EC 81 MG tablet Take 1 tablet (81 mg total) by mouth daily.  [name] atorvastatin (LIPITOR) 40 MG tablet Take 1 tablet (40 mg total) by mouth daily.  . bisoprolol (ZEBETA) 5 MG tablet Take 1 tablet (5 mg total) by mouth daily.  . Carboxymethylcellul-Glycerin (LUBRICATING EYE DROPS OP) Apply 1 drop to eye daily as needed (dry eyes).  . Cholecalciferol (VITAMIN D3) 2000 units capsule Take 2,000 Units by mouth daily.   . clopidogrel (PLAVIX) 75 MG tablet Take 1 tablet (75 mg total) by mouth daily with breakfast.  . furosemide (LASIX) 40 MG tablet TK 1 T PO D PRN FOR FLUID OR EDEMA  . insulin glargine (LANTUS) 100 UNIT/ML injection Inject 0.55 mLs (55 Units total) into the skin at bedtime.  [name] levofloxacin (LEVAQUIN) 500 MG tablet Take 1 tablet (500 mg total) daily by mouth.  . levothyroxine (SYNTHROID, LEVOTHROID) 125 MCG tablet Take 250 mcg by mouth daily before breakfast.  . losartan (COZAAR) 50 MG tablet Take 1 tablet (50 mg total) by mouth daily.  . magnesium (MAGTAB) 84 MG ( ) TBCR SR tablet Take 168 mg by mouth at bedtime.  . metFORMIN (GLUCOPHAGE-XR) 750 MG 24 hr tablet Take 750 mg by mouth 2 (two) times daily.  . Multiple Vitamins-Minerals (MEGA MULTIVITAMIN FOR MEN PO) Take 1 tablet by mouth daily.   . ondansetron (ZOFRAN) 4 MG tablet Take 1 tablet (4 mg total) every 8 (eight) hours as needed by mouth for nausea or vomiting.  . OXYGEN Inhale 2 L into the lungs continuous.  . pregabalin (LYRICA) 50 MG capsule Take 50 mg by mouth 2 (two) times daily.  . Semaglutide (OZEMPIC) 0.25 or 0.5 MG/DOSE SOPN Inject into the skin.  [name] sertraline (ZOLOFT) 100 MG tablet Take 50 mg by mouth daily.   [name] spironolactone (ALDACTONE) 25 MG tablet Take 0.5 tablets (12.5 mg total) by mouth daily.  . tamsulosin (FLOMAX) 0.4 MG CAPS capsule Take 2 capsules (0.8 mg total) by mouth at bedtime.  [name] tiotropium  (SPIRIVA HANDIHALER) 18 MCG inhalation capsule Place 18 mcg into inhaler and inhale daily.    . predniSONE (DELTASONE) 10 MG tablet 2 tabs daily for 1 week, then 1 tab daily until seen back .  . predniSONE (STERAPRED UNI-PAK 21 TAB) 10 MG (21) TBPK tablet Take daily by mouth. Take 6 tabs by mouth daily  for 2 days, then 5 tabs for 2 days, then 4 tabs for 2 days, then 3 tabs for 2 days, 2 tabs for 2 days, then 1 tab by mouth daily for 2 days (Patient not taking: Reported on 07/16/2017)   No facility-administered encounter medications on file as of 07/16/2017.      Review of Systems  Constitutional:   No  weight loss, night sweats,  Fevers, chills,  +fatigue, or  lassitude.  HEENT:   No headaches,  Difficulty swallowing,  Tooth/dental problems, or  Sore throat,                No sneezing, itching, ear ache, nasal congestion, post nasal drip,   CV:  No chest pain,  Orthopnea, PND, swelling in lower extremities, anasarca, dizziness, palpitations, syncope.   GI  No heartburn, indigestion, abdominal pain, nausea, vomiting, diarrhea, change in bowel habits, loss of appetite, bloody stools.   Resp:  .  No chest wall deformity  Skin: no rash or lesions.  GU: no dysuria, change in color of urine, no urgency or frequency.  No flank pain, no hematuria   MS:  No joint pain or swelling.  No decreased range of motion.  No back pain.    Physical Exam  BP 136/62 (BP Location: Left Arm, Cuff Size: Normal)   Pulse 89   SpO2 96%   GEN: A/Ox3; pleasant , NAD, elderly    HEENT:  [place]/AT,  EACs-clear, TMs-wnl, NOSE-clear, THROAT-clear, no lesions, no postnasal drip or exudate noted.   NECK:  Supple w/ fair ROM; no JVD; normal carotid impulses w/o bruits; no thyromegaly or nodules palpated; no lymphadenopathy.    RESP Decreased BS in bases w/ no wheezing .   CARD:  RRR, no m/r/g, tr  peripheral edema, pulses intact, no cyanosis or clubbing.  GI:   Soft & nt; nml bowel sounds; no organomegaly or  masses detected.   Musco: Warm bil, no deformities or joint swelling noted.   Neuro: alert, no focal deficits noted.    Skin: Warm, no lesions or rashes    Lab Results:  CBC   BMET  Dg Chest 2 View  Result Date: 07/16/2017 CLINICAL DATA:  Follow-up pneumonia EXAM: CHEST  2 VIEW COMPARISON:  06/15/2017 and multiple other chest x-rays and chest CTs dating back to August 2018 FINDINGS: There is hyperinflation of the lungs compatible with COPD. Right apical pleural/ parenchymal thickening/scarring, stable. Scarring in the lung bases. Scarring throughout the right lung. Stable left suprahilar density, favor scarring. Stable pleural thickening or small effusions. No definite acute infiltrate. IMPRESSION: Stable chronic changes in the lungs dating back to August 2018. No definite acute process. Electronically Signed   By: Charlett Nose M.D.   On: 07/16/2017 11:14     Assessment & Plan:   COPD (chronic obstructive pulmonary disease) (HCC) SEvere COPD , O2 dependent  Pt w/ increased sx off steroids . Will add back low dose until seen back in office in 4-6 weeks  Pt aware to monitor sugars closely on steroids and report >200.   Plan  Patient Instructions  Begin Prednisone 20mg  daily for 1 week then 10mg  daily -hold at this dose until seen back in office  Continue on Striverdi daily .  Continue on Spiriva daily.  Continue on Oxygen 2l/m .  Continue on CPAP At bedtime  With oxygen .  Chest xray today .  Follow up with in 6 weeks Dr.  And As needed   Please contact office for sooner follow up if symptoms do not improve or worsen or seek emergency care       RESPIRATORY FAILURE, CHRONIC Cont on O2 .   OSA on CPAP Cont on CPAP At bedtime    Lung mass Stable on most recent CT chest  Will remain of ENBREL for now .  Check cxr today  Will need follow up CT chest going forward in few months .   Plan  . Patient Instructions  Begin Prednisone 20mg  daily for 1 week then 10mg   daily -hold at this dose until seen back in office  Continue on Striverdi daily .  Continue on Spiriva daily.  Continue on Oxygen 2l/m .  Continue on CPAP At bedtime  With oxygen .  Chest xray today .  Follow up with in 6 weeks Dr. Vassie Loll  And As needed   Please contact office for sooner follow up if symptoms do not improve or worsen or seek emergency care          Rubye Oaks, NP 07/16/2017

## 2017-07-30 ENCOUNTER — Telehealth: Payer: Self-pay | Admitting: *Deleted

## 2017-07-30 DIAGNOSIS — I679 Cerebrovascular disease, unspecified: Secondary | ICD-10-CM

## 2017-07-30 NOTE — Telephone Encounter (Signed)
Left message for pt to call, he is due to have carotid dopplers in the [place] location. Order placed.

## 2017-08-20 NOTE — Telephone Encounter (Signed)
Spoke with pt wife, number given for them to call the [place] office to schedule.

## 2017-08-31 ENCOUNTER — Other Ambulatory Visit: Payer: Self-pay

## 2017-08-31 ENCOUNTER — Encounter (HOSPITAL_BASED_OUTPATIENT_CLINIC_OR_DEPARTMENT_OTHER): Payer: Self-pay

## 2017-08-31 ENCOUNTER — Inpatient Hospital Stay (HOSPITAL_BASED_OUTPATIENT_CLINIC_OR_DEPARTMENT_OTHER)
Admission: EM | Admit: 2017-08-31 | Discharge: 2017-09-05 | DRG: 872 | Disposition: A | Discharge status: Home / Self Care | Payer: Medicare Other | Attending: Internal Medicine | Admitting: Internal Medicine

## 2017-08-31 ENCOUNTER — Emergency Department (HOSPITAL_BASED_OUTPATIENT_CLINIC_OR_DEPARTMENT_OTHER): Payer: Medicare Other

## 2017-08-31 DIAGNOSIS — I712 Thoracic aortic aneurysm, without rupture: Secondary | ICD-10-CM | POA: Diagnosis present

## 2017-08-31 DIAGNOSIS — Z7902 Long term (current) use of antithrombotics/antiplatelets: Secondary | ICD-10-CM

## 2017-08-31 DIAGNOSIS — E785 Hyperlipidemia, unspecified: Secondary | ICD-10-CM | POA: Diagnosis present

## 2017-08-31 DIAGNOSIS — J9611 Chronic respiratory failure with hypoxia: Secondary | ICD-10-CM | POA: Diagnosis present

## 2017-08-31 DIAGNOSIS — Z794 Long term (current) use of insulin: Secondary | ICD-10-CM

## 2017-08-31 DIAGNOSIS — E119 Type 2 diabetes mellitus without complications: Secondary | ICD-10-CM | POA: Diagnosis present

## 2017-08-31 DIAGNOSIS — Z955 Presence of coronary angioplasty implant and graft: Secondary | ICD-10-CM

## 2017-08-31 DIAGNOSIS — R0602 Shortness of breath: Secondary | ICD-10-CM | POA: Diagnosis present

## 2017-08-31 DIAGNOSIS — Z9989 Dependence on other enabling machines and devices: Secondary | ICD-10-CM

## 2017-08-31 DIAGNOSIS — E039 Hypothyroidism, unspecified: Secondary | ICD-10-CM | POA: Diagnosis present

## 2017-08-31 DIAGNOSIS — I5042 Chronic combined systolic (congestive) and diastolic (congestive) heart failure: Secondary | ICD-10-CM | POA: Diagnosis present

## 2017-08-31 DIAGNOSIS — Z7982 Long term (current) use of aspirin: Secondary | ICD-10-CM

## 2017-08-31 DIAGNOSIS — A419 Sepsis, unspecified organism: Principal | ICD-10-CM

## 2017-08-31 DIAGNOSIS — Z87891 Personal history of nicotine dependence: Secondary | ICD-10-CM

## 2017-08-31 DIAGNOSIS — Z9981 Dependence on supplemental oxygen: Secondary | ICD-10-CM

## 2017-08-31 DIAGNOSIS — I719 Aortic aneurysm of unspecified site, without rupture: Secondary | ICD-10-CM | POA: Diagnosis present

## 2017-08-31 DIAGNOSIS — Z9841 Cataract extraction status, right eye: Secondary | ICD-10-CM

## 2017-08-31 DIAGNOSIS — E118 Type 2 diabetes mellitus with unspecified complications: Secondary | ICD-10-CM

## 2017-08-31 DIAGNOSIS — N400 Enlarged prostate without lower urinary tract symptoms: Secondary | ICD-10-CM | POA: Diagnosis present

## 2017-08-31 DIAGNOSIS — J449 Chronic obstructive pulmonary disease, unspecified: Secondary | ICD-10-CM | POA: Diagnosis present

## 2017-08-31 DIAGNOSIS — I480 Paroxysmal atrial fibrillation: Secondary | ICD-10-CM

## 2017-08-31 DIAGNOSIS — F419 Anxiety disorder, unspecified: Secondary | ICD-10-CM | POA: Diagnosis present

## 2017-08-31 DIAGNOSIS — I110 Hypertensive heart disease with heart failure: Secondary | ICD-10-CM | POA: Diagnosis present

## 2017-08-31 DIAGNOSIS — H9190 Unspecified hearing loss, unspecified ear: Secondary | ICD-10-CM | POA: Diagnosis present

## 2017-08-31 DIAGNOSIS — G4733 Obstructive sleep apnea (adult) (pediatric): Secondary | ICD-10-CM

## 2017-08-31 DIAGNOSIS — F32A Depression, unspecified: Secondary | ICD-10-CM | POA: Diagnosis present

## 2017-08-31 DIAGNOSIS — I2510 Atherosclerotic heart disease of native coronary artery without angina pectoris: Secondary | ICD-10-CM | POA: Diagnosis present

## 2017-08-31 DIAGNOSIS — D72829 Elevated white blood cell count, unspecified: Secondary | ICD-10-CM | POA: Diagnosis present

## 2017-08-31 DIAGNOSIS — R251 Tremor, unspecified: Secondary | ICD-10-CM | POA: Diagnosis present

## 2017-08-31 DIAGNOSIS — J8410 Pulmonary fibrosis, unspecified: Secondary | ICD-10-CM | POA: Diagnosis present

## 2017-08-31 DIAGNOSIS — Z79890 Hormone replacement therapy (postmenopausal): Secondary | ICD-10-CM

## 2017-08-31 DIAGNOSIS — Z79899 Other long term (current) drug therapy: Secondary | ICD-10-CM

## 2017-08-31 DIAGNOSIS — E1169 Type 2 diabetes mellitus with other specified complication: Secondary | ICD-10-CM | POA: Diagnosis present

## 2017-08-31 DIAGNOSIS — R000 Tachycardia, unspecified: Secondary | ICD-10-CM | POA: Clinically undetermined

## 2017-08-31 DIAGNOSIS — Z7952 Long term (current) use of systemic steroids: Secondary | ICD-10-CM

## 2017-08-31 DIAGNOSIS — I255 Ischemic cardiomyopathy: Secondary | ICD-10-CM | POA: Diagnosis present

## 2017-08-31 DIAGNOSIS — B952 Enterococcus as the cause of diseases classified elsewhere: Secondary | ICD-10-CM | POA: Diagnosis present

## 2017-08-31 DIAGNOSIS — I10 Essential (primary) hypertension: Secondary | ICD-10-CM | POA: Diagnosis present

## 2017-08-31 DIAGNOSIS — Z9842 Cataract extraction status, left eye: Secondary | ICD-10-CM

## 2017-08-31 DIAGNOSIS — I447 Left bundle-branch block, unspecified: Secondary | ICD-10-CM | POA: Diagnosis present

## 2017-08-31 DIAGNOSIS — F329 Major depressive disorder, single episode, unspecified: Secondary | ICD-10-CM | POA: Diagnosis present

## 2017-08-31 DIAGNOSIS — F4310 Post-traumatic stress disorder, unspecified: Secondary | ICD-10-CM | POA: Diagnosis present

## 2017-08-31 DIAGNOSIS — N3000 Acute cystitis without hematuria: Secondary | ICD-10-CM

## 2017-08-31 DIAGNOSIS — N390 Urinary tract infection, site not specified: Secondary | ICD-10-CM | POA: Diagnosis present

## 2017-08-31 DIAGNOSIS — M069 Rheumatoid arthritis, unspecified: Secondary | ICD-10-CM | POA: Diagnosis present

## 2017-08-31 DIAGNOSIS — I5022 Chronic systolic (congestive) heart failure: Secondary | ICD-10-CM | POA: Diagnosis present

## 2017-08-31 DIAGNOSIS — R509 Fever, unspecified: Secondary | ICD-10-CM | POA: Diagnosis not present

## 2017-08-31 DIAGNOSIS — Z961 Presence of intraocular lens: Secondary | ICD-10-CM | POA: Diagnosis present

## 2017-08-31 HISTORY — DX: Aortic aneurysm of unspecified site, without rupture: I71.9

## 2017-08-31 HISTORY — DX: Chronic systolic (congestive) heart failure: I50.22

## 2017-08-31 HISTORY — DX: Ischemic cardiomyopathy: I25.5

## 2017-08-31 LAB — I-STAT CHEM 8, ED
BUN: 20 mg/dL (ref 6–20)
CHLORIDE: 99 mmol/L — AB (ref 101–111)
Calcium, Ion: 0.83 mmol/L — CL (ref 1.15–1.40)
Creatinine, Ser: 1.1 mg/dL (ref 0.61–1.24)
GLUCOSE: 244 mg/dL — AB (ref 65–99)
HCT: 35 % — ABNORMAL LOW (ref 39.0–52.0)
Hemoglobin: 11.9 g/dL — ABNORMAL LOW (ref 13.0–17.0)
POTASSIUM: 4.5 mmol/L (ref 3.5–5.1)
SODIUM: 135 mmol/L (ref 135–145)
TCO2: 25 mmol/L (ref 22–32)

## 2017-08-31 LAB — CBC WITH DIFFERENTIAL/PLATELET
BASOS ABS: 0 10*3/uL (ref 0.0–0.1)
Basophils Relative: 0 %
Eosinophils Absolute: 0.1 10*3/uL (ref 0.0–0.7)
Eosinophils Relative: 1 %
HCT: 35.7 % — ABNORMAL LOW (ref 39.0–52.0)
Hemoglobin: 11.7 g/dL — ABNORMAL LOW (ref 13.0–17.0)
LYMPHS ABS: 0.8 10*3/uL (ref 0.7–4.0)
LYMPHS PCT: 8 %
MCH: 27.7 pg (ref 26.0–34.0)
MCHC: 32.8 g/dL (ref 30.0–36.0)
MCV: 84.6 fL (ref 78.0–100.0)
MONO ABS: 1 10*3/uL (ref 0.1–1.0)
Monocytes Relative: 10 %
NEUTROS ABS: 8.8 10*3/uL — AB (ref 1.7–7.7)
Neutrophils Relative %: 81 %
Platelets: 129 10*3/uL — ABNORMAL LOW (ref 150–400)
RBC: 4.22 MIL/uL (ref 4.22–5.81)
RDW: 14.7 % (ref 11.5–15.5)
WBC: 10.8 10*3/uL — ABNORMAL HIGH (ref 4.0–10.5)

## 2017-08-31 LAB — HEPATIC FUNCTION PANEL
ALK PHOS: 57 U/L (ref 38–126)
ALT: 24 U/L (ref 17–63)
AST: 25 U/L (ref 15–41)
Albumin: 3.9 g/dL (ref 3.5–5.0)
BILIRUBIN DIRECT: 0.3 mg/dL (ref 0.1–0.5)
BILIRUBIN INDIRECT: 0.4 mg/dL (ref 0.3–0.9)
TOTAL PROTEIN: 6.6 g/dL (ref 6.5–8.1)
Total Bilirubin: 0.7 mg/dL (ref 0.3–1.2)

## 2017-08-31 LAB — URINALYSIS, ROUTINE W REFLEX MICROSCOPIC
Bilirubin Urine: NEGATIVE
Glucose, UA: >=500 mg/dL — AB
Ketones, ur: 15 mg/dL — AB
Nitrite: POSITIVE — AB
PROTEIN: 100 mg/dL — AB
Specific Gravity, Urine: 1.01 (ref 1.005–1.030)
pH: 5.5 (ref 5.0–8.0)

## 2017-08-31 LAB — URINALYSIS, MICROSCOPIC (REFLEX)

## 2017-08-31 LAB — I-STAT CG4 LACTIC ACID, ED
LACTIC ACID, VENOUS: 1.01 mmol/L (ref 0.5–1.9)
Lactic Acid, Venous: 2.48 mmol/L (ref 0.5–1.9)

## 2017-08-31 MED ORDER — PIPERACILLIN-TAZOBACTAM 3.375 G IVPB
3.3750 g | Freq: Three times a day (TID) | INTRAVENOUS | Status: DC
  Administered 2017-09-01 – 2017-09-03 (×8): 3.375 g via INTRAVENOUS
  Filled 2017-08-31 (×9): qty 50

## 2017-08-31 MED ORDER — VANCOMYCIN HCL 10 G IV SOLR
2000.0000 mg | Freq: Once | INTRAVENOUS | Status: AC
  Administered 2017-08-31: 2000 mg via INTRAVENOUS
  Filled 2017-08-31: qty 2000

## 2017-08-31 MED ORDER — ACETAMINOPHEN 325 MG PO TABS
650.0000 mg | ORAL_TABLET | Freq: Once | ORAL | Status: AC
  Administered 2017-08-31: 650 mg via ORAL
  Filled 2017-08-31: qty 2

## 2017-08-31 MED ORDER — SODIUM CHLORIDE 0.9 % IV SOLN
INTRAVENOUS | Status: DC
  Administered 2017-08-31 – 2017-09-03 (×4): via INTRAVENOUS

## 2017-08-31 MED ORDER — VANCOMYCIN HCL 10 G IV SOLR
1250.0000 mg | Freq: Two times a day (BID) | INTRAVENOUS | Status: DC
  Filled 2017-08-31: qty 1250

## 2017-08-31 MED ORDER — VANCOMYCIN HCL IN DEXTROSE 1-5 GM/200ML-% IV SOLN
INTRAVENOUS | Status: AC
  Filled 2017-08-31: qty 200

## 2017-08-31 MED ORDER — SODIUM CHLORIDE 0.9 % IV BOLUS (SEPSIS)
1000.0000 mL | Freq: Once | INTRAVENOUS | Status: AC
Start: 2017-08-31 — End: 2017-08-31
  Administered 2017-08-31: 1000 mL via INTRAVENOUS

## 2017-08-31 MED ORDER — VANCOMYCIN HCL IN DEXTROSE 1-5 GM/200ML-% IV SOLN
1000.0000 mg | Freq: Once | INTRAVENOUS | Status: DC
  Filled 2017-08-31: qty 200

## 2017-08-31 MED ORDER — IOPAMIDOL (ISOVUE-300) INJECTION 61%
100.0000 mL | Freq: Once | INTRAVENOUS | Status: AC | PRN
  Administered 2017-08-31: 100 mL via INTRAVENOUS

## 2017-08-31 MED ORDER — PIPERACILLIN-TAZOBACTAM 3.375 G IVPB 30 MIN
3.3750 g | Freq: Once | INTRAVENOUS | Status: AC
  Administered 2017-08-31: 3.375 g via INTRAVENOUS
  Filled 2017-08-31 (×2): qty 50

## 2017-08-31 NOTE — ED Notes (Signed)
Patient transported to CT 

## 2017-08-31 NOTE — ED Notes (Signed)
Called [facility] to page hospitalist @ [place] again.

## 2017-08-31 NOTE — ED Provider Notes (Addendum)
[facility] EMERGENCY DEPARTMENT Provider Note   CSN: [id number] Arrival date & time: 08/31/17  1927     History   Chief Complaint Chief Complaint  Patient presents with  . Chills    HPI [name] is a 77 y.o. male.  Patient presents with concern for possible pneumonia or urinary tract infection.  Patient's had chills and fever and dysuria.  Also has had some increased shortness of breath.  Normally on 2 L of oxygen for his COPD.  But is required an increase.  Patient on 3 L nasal cannula was around 90%.  Patient with admission in May 2018 for community-acquired pneumonia.  In November 2018 at community acquired pneumonia but did not require admission.  Patient thinks that most likely he is got a urinary tract infection based on his symptoms.      Past Medical History:  Diagnosis Date  . Anxiety   . BENIGN PROSTATIC HYPERTROPHY 11/23/2009  . Cardiomyopathy (HCC) 08/28/2016  . COPD (chronic obstructive pulmonary disease) (HCC)   . CORONARY ARTERY DISEASE 11/23/2009  . 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 DEGENERATIVE JOINT DISEASE 11/23/2009  . DEPRESSION 11/23/2009  . FATIGUE 11/23/2009  . GAIT DISTURBANCE 12/10/2009  . HEMOPTYSIS UNSPECIFIED 05/07/2010  . High cholesterol   . HYPERTENSION 07/30/2009  . HYPOTHYROIDISM 07/30/2009  . 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 On home oxygen therapy    "2-3L; 24/7" (09/10/2016)  . OSA on CPAP   . PTSD (post-traumatic stress disorder) 03/10/2012  . 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 RA (rheumatoid arthritis) (HCC) 06/11/2011   "qwhere" (09/10/2016)  . RESPIRATORY FAILURE, CHRONIC 07/31/2009  . Scleritis of both eyes 03/17/2014  . 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 Type II diabetes mellitus [facility])     Patient Active Problem List   Diagnosis Date Noted  . Lung mass 06/11/2017  . Acute on chronic combined systolic and diastolic CHF (congestive heart failure) (HCC) 09/11/2016  . Unstable angina (HCC)   . Cardiomyopathy (HCC)  08/28/2016  . Urinary urgency 07/10/2016  . Shoulder arthritis 06/12/2016  . Polyarthralgia 04/04/2016  . 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 Corn of foot 10/02/2015  . Post-nasal drip 12/11/2014  . COPD, severe (HCC) 08/04/2014  . Dysuria 05/04/2014  . Scleritis of both eyes 03/17/2014  . Hyperlipidemia 01/05/2014  . Ileus (HCC) 08/22/2013  . Community acquired pneumonia 07/19/2013  . COPD exacerbation (HCC) 05/31/2012  . Obesity 04/20/2012  . PTSD (post-traumatic stress disorder) 03/10/2012  . Orthostatic hypotension 02/02/2012  . Syncope 01/26/2012  . RA (rheumatoid arthritis) (HCC) 06/11/2011  . OSA on CPAP 06/10/2011  . LBBB (left bundle branch block) 03/25/2011  . Diastolic dysfunction 02/25/2011  . Insomnia 12/05/2010  . Erectile dysfunction 12/05/2010  . Preventative health care 12/04/2010  . 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 GAIT DISTURBANCE 12/10/2009  . Diabetes (HCC) 11/23/2009  . Depression 11/23/2009  . Coronary atherosclerosis 11/23/2009  . BENIGN PROSTATIC HYPERTROPHY 11/23/2009  . DEGENERATIVE JOINT DISEASE 11/23/2009  . FATIGUE 11/23/2009  . 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 RESPIRATORY FAILURE, CHRONIC 07/31/2009  . Hypothyroidism 07/30/2009  . Essential hypertension 07/30/2009  . COPD (chronic obstructive pulmonary disease) (HCC) 07/30/2009    Past Surgical History:  Procedure Laterality Date  . 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 ABDOMINAL EXPLORATION SURGERY  02/2004   w/LOA/notes 12/10/2010; small bowel obstruction repair with adhesiolysis   . BACK SURGERY    .  CARDIAC CATHETERIZATION     2 heart caths in the past.  One in 2000s showed one ulcerated plaque  Rx medically; Second at [facility] [name] 09/05/2016  . CATARACT EXTRACTION W/ INTRAOCULAR LENS  IMPLANT, BILATERAL Bilateral 2000s  . COLECTOMY     hx of remote ileum resection due to bleeding  . CORONARY ANGIOPLASTY WITH  STENT PLACEMENT  09/10/2016  . CORONARY STENT INTERVENTION N/A 09/10/2016   Procedure: Coronary Stent Intervention;  Surgeon: Kathleene Hazel, MD;  Location: [facility] INVASIVE CV LAB;  Service: Cardiovascular;  Laterality: N/A;  Distal RCA 4.0x16 Synergy  . FEMORAL EMBOLOECTOMY Left 07/2000   with left leg ischemia; Dr. Hart Rochester, vascular  . GANGLION CYST EXCISION Right    "wrist"; Dr. Teressa Senter  . LUMBAR LAMINECTOMY  1972   Dr. Fannie Knee  . RIGHT/LEFT HEART CATH AND CORONARY ANGIOGRAPHY N/A 09/10/2016   Procedure: Right/Left Heart Cath and Coronary Angiography;  Surgeon: Kathleene Hazel, MD;  Location: [facility] INVASIVE CV LAB;  Service: Cardiovascular;  Laterality: N/A;  . TONSILLECTOMY         Home Medications    Prior to Admission medications   Medication Sig Start Date End Date Taking? Authorizing Provider  acetaminophen (TYLENOL) 325 MG tablet Take 500 mg daily as needed by mouth for moderate pain or headache.     [provider]  albuterol (PROAIR HFA) 108 (90 BASE) MCG/ACT inhaler Inhale 2 puffs into the lungs every 4 (four) hours as needed. Shortness of breath 10/09/11   Kalman Shan, MD  albuterol (PROVENTIL) (2.5 MG/3ML) 0.083% nebulizer solution 1 vial in nebulizer every 6 hours as needed Dx 496 05/31/12   Kalman Shan, MD  aspirin EC 81 MG tablet Take 1 tablet (81 mg total) by mouth daily. 09/17/16   Bhagat, Sharrell Ku, PA  atorvastatin (LIPITOR) 40 MG tablet Take 1 tablet (40 mg total) by mouth daily. 10/21/16   Pricilla Riffle, MD  bisoprolol (ZEBETA) 5 MG tablet Take 1 tablet (5 mg total) by mouth daily. 09/11/16   Strader, Lennart Pall, PA-C  Carboxymethylcellul-Glycerin (LUBRICATING EYE DROPS OP) Apply 1 drop to eye daily as needed (dry eyes).    [provider]  Cholecalciferol (VITAMIN D3) 2000 units capsule Take 2,000 Units by mouth daily.  01/11/16   [provider]  clopidogrel (PLAVIX) 75 MG tablet Take 1 tablet (75 mg total) by mouth daily with  breakfast. 09/11/16   [place] Ouch, Lennart Pall, PA-C  furosemide (LASIX) 40 MG tablet TK 1 T PO D PRN FOR FLUID OR EDEMA 09/11/16   [provider]  insulin glargine (LANTUS) 100 UNIT/ML injection Inject 0.55 mLs (55 Units total) into the skin at bedtime. 12/28/16   Albertine Grates, MD  levofloxacin (LEVAQUIN) 500 MG tablet Take 1 tablet (500 mg total) daily by mouth. 06/15/17   Margarita Grizzle, MD  levothyroxine (SYNTHROID, LEVOTHROID) 125 MCG tablet Take 250 mcg by mouth daily before breakfast.    [provider]  losartan (COZAAR) 50 MG tablet Take 1 tablet (50 mg total) by mouth daily. 08/24/13   Zannie Cove, MD  magnesium (MAGTAB) 84 MG ( ) TBCR SR tablet Take 168 mg by mouth at bedtime.    [provider]  metFORMIN (GLUCOPHAGE-XR) 750 MG 24 hr tablet Take 750 mg by mouth 2 (two) times daily.    [provider]  Multiple Vitamins-Minerals (MEGA MULTIVITAMIN FOR MEN PO) Take 1 tablet by mouth daily.     [provider]  ondansetron (ZOFRAN) 4 MG tablet  Take 1 tablet (4 mg total) every 8 (eight) hours as needed by mouth for nausea or vomiting. 06/03/17   Sharlene Dory, DO  OXYGEN Inhale 2 L into the lungs continuous.    [provider]  predniSONE (DELTASONE) 10 MG tablet 2 tabs daily for 1 week, then 1 tab daily until seen back . 07/16/17   Parrett, Virgel Bouquet, NP  predniSONE (STERAPRED UNI-PAK 21 TAB) 10 MG (21) TBPK tablet Take daily by mouth. Take 6 tabs by mouth daily  for 2 days, then 5 tabs for 2 days, then 4 tabs for 2 days, then 3 tabs for 2 days, 2 tabs for 2 days, then 1 tab by mouth daily for 2 days Patient not taking: Reported on 07/16/2017 06/15/17   Margarita Grizzle, MD  pregabalin (LYRICA) 50 MG capsule Take 50 mg by mouth 2 (two) times daily.    [provider]  Semaglutide (OZEMPIC) 0.25 or 0.5 MG/DOSE SOPN Inject into the skin.    [provider]  sertraline (ZOLOFT) 100 MG tablet Take 50 mg by mouth daily.      [provider]  spironolactone (ALDACTONE) 25 MG tablet Take 0.5 tablets (12.5 mg total) by mouth daily. 12/31/16   Sharlene Dory, DO  tamsulosin (FLOMAX) 0.4 MG CAPS capsule Take 2 capsules (0.8 mg total) by mouth at bedtime. 12/28/16   Albertine Grates, MD  tiotropium (SPIRIVA HANDIHALER) 18 MCG inhalation capsule Place 1 capsule (18 mcg total) into inhaler and inhale daily. 07/16/17   Parrett, Virgel Bouquet, NP    Family History Family History  Problem Relation Age of Onset  . Other Mother        gun shot    Social History Social History   Tobacco Use  . Smoking status: Former Smoker    Packs/day: 2.50    Years: 40.00    Pack years: 100.00    Types: Cigarettes, Pipe, Cigars    Last attempt to quit: 07/28/1998    Years since quitting: 19.1  . Smokeless tobacco: Never Used  Substance Use Topics  . Alcohol use: No    Alcohol/week: 0.0 oz  . Drug use: No     Allergies   Patient has no known allergies.   Review of Systems Review of Systems  Constitutional: Positive for chills and fever.  HENT: Negative for congestion.   Eyes: Negative for redness.  Respiratory: Positive for shortness of breath.   Cardiovascular: Negative for chest pain.  Gastrointestinal: Negative for abdominal pain.  Genitourinary: Positive for dysuria.  Musculoskeletal: Negative for back pain.  Skin: Negative for rash.  Neurological: Negative for headaches.  Hematological: Does not bruise/bleed easily.  Psychiatric/Behavioral: Negative for confusion.     Physical Exam Updated Vital Signs BP (!) 144/62   Pulse 81   Temp 97.8 F (36.6 C) (Oral)   Resp (!) 26   Ht 1.905 m (6\' 3" )   Wt 95.7 kg (211 lb)   SpO2 93%   BMI 26.37 kg/m   Physical Exam  Constitutional: He is oriented to person, place, and time. He appears well-developed and well-nourished. He appears distressed.  HENT:  Head: Normocephalic and atraumatic.  Mucous membranes slightly dry.  Eyes: EOM are normal. Pupils are  equal, round, and reactive to light.  Neck: Neck supple.  Cardiovascular: Normal rate, regular rhythm and normal heart sounds.  Pulmonary/Chest: Breath sounds normal. He is in respiratory distress. He has no wheezes. He has no rales.  Abdominal: Soft. Bowel sounds  are normal. There is no tenderness.  Musculoskeletal: Normal range of motion. He exhibits no edema.  Neurological: He is alert and oriented to person, place, and time. No cranial nerve deficit or sensory deficit. He exhibits normal muscle tone. Coordination normal.  Skin: Skin is warm.  Nursing note and vitals reviewed.    ED Treatments / Results  Labs (all labs ordered are listed, but only abnormal results are displayed) Labs Reviewed  CBC WITH DIFFERENTIAL/PLATELET - Abnormal; Notable for the following components:      Result Value   WBC 10.8 (*)    Hemoglobin 11.7 (*)    HCT 35.7 (*)    Platelets 129 (*)    Neutro Abs 8.8 (*)    All other components within normal limits  URINALYSIS, ROUTINE W REFLEX MICROSCOPIC - Abnormal; Notable for the following components:   Color, Urine ORANGE (*)    APPearance TURBID (*)    Glucose, UA >=500 (*)    Hgb urine dipstick SMALL (*)    Ketones, ur 15 (*)    Protein, ur 100 (*)    Nitrite POSITIVE (*)    Leukocytes, UA LARGE (*)    All other components within normal limits  URINALYSIS, MICROSCOPIC (REFLEX) - Abnormal; Notable for the following components:   Bacteria, UA FEW (*)    Squamous Epithelial / LPF 0-5 (*)    All other components within normal limits  I-STAT CG4 LACTIC ACID, ED - Abnormal; Notable for the following components:   Lactic Acid, Venous 2.48 (*)    All other components within normal limits  I-STAT CHEM 8, ED - Abnormal; Notable for the following components:   Chloride 99 (*)    Glucose, Bld 244 (*)    Calcium, Ion 0.83 (*)    Hemoglobin 11.9 (*)    HCT 35.0 (*)    All other components within normal limits  CULTURE, BLOOD (ROUTINE X 2)  CULTURE, BLOOD  (ROUTINE X 2)  URINE CULTURE  HEPATIC FUNCTION PANEL  I-STAT CG4 LACTIC ACID, ED  I-STAT CG4 LACTIC ACID, ED    EKG  EKG Interpretation  Date/Time:  Monday August 31 2017 20:16:01 EST Ventricular Rate:  83 PR Interval:    QRS Duration: 133 QT Interval:  375 QTC Calculation: 441 R Axis:   -62 Text Interpretation:  Sinus rhythm Left bundle branch block No significant change since last tracing Confirmed by Vanetta Mulders [phone]) on 08/31/2017 8:41:30 PM       Radiology Dg Chest 2 View  Result Date: 08/31/2017 CLINICAL DATA:  Shortness of breath and chills EXAM: CHEST  2 VIEW COMPARISON:  July 16, 2017 and Dec 25, 2016 FINDINGS: There is scarring throughout portions of the right lung with chronic blunting of the right costophrenic angle. There is also chronic blunting of the left costophrenic angle with scarring in the left base. There is no edema or consolidation. The heart size and pulmonary vascularity are within normal limits. No adenopathy. No evident bone lesions. IMPRESSION: Areas of scarring bilaterally, more on the right than on the left. Blunting of the costophrenic angles bilaterally, chronic. No edema or consolidation. Stable cardiac silhouette. Electronically Signed   By: Bretta Bang III M.D.   On: 08/31/2017 20:28   Ct Chest W Contrast  Result Date: 08/31/2017 CLINICAL DATA:  Acute onset of shortness of breath and chills. EXAM: CT CHEST WITH CONTRAST TECHNIQUE: Multidetector CT imaging of the chest was performed during intravenous contrast administration. CONTRAST:  ISOVUE-300 IOPAMIDOL (ISOVUE-300)  INJECTION 61% COMPARISON:  Chest radiograph performed earlier today 7:53 p.m., and CT of the chest performed 06/12/2017 FINDINGS: Cardiovascular: The heart is normal in size. Scattered coronary artery calcifications are seen. There is mild focal aneurysmal dilatation of the aortic arch to 3.7 cm in diameter, with mild underlying calcification. The great vessels  demonstrate scattered calcification but are otherwise grossly unremarkable. Mediastinum/Nodes: The mediastinum is otherwise unremarkable in appearance. No mediastinal lymphadenopathy is seen. No pericardial effusion is identified. The thyroid gland is not well characterized. No axillary lymphadenopathy is seen. Lungs/Pleura: Chronic scarring is noted at the lung apices, with associated calcification on the right. Additional nodular left-sided airspace opacities have improved from the prior study, reflecting improving chronic infection. Diffuse right-sided pleural calcifications are seen. No pleural effusion or pneumothorax is seen. Upper Abdomen: The visualized portions of the liver and spleen are unremarkable. Stones are seen dependently within the gallbladder. The visualized portions of the pancreas, adrenal glands and kidneys are within normal limits. Musculoskeletal: No acute osseous abnormalities are identified. The visualized musculature is unremarkable in appearance. IMPRESSION: 1. Nodular opacities within the left lung have improved from the prior study, reflecting improving chronic infection. 2. Chronic biapical scarring, with associated diffuse right-sided pleural calcification. 3. 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 4. Cholelithiasis.  Gallbladder otherwise unremarkable. Electronically Signed   By: Roanna Raider M.D.   On: 08/31/2017 22:12    Procedures Procedures (including critical care time)  CRITICAL CARE Performed by: Vanetta Mulders Total critical care time: 30 minutes Critical care time was exclusive of separately billable procedures and treating other patients. Critical care was necessary to treat or prevent imminent or life-threatening deterioration. Critical care was time spent personally by me on the following activities: development of treatment plan with patient and/or surrogate as well as nursing,  discussions with consultants, evaluation of patient's response to treatment, examination of patient, obtaining history from patient or surrogate, ordering and performing treatments and interventions, ordering and review of laboratory studies, ordering and review of radiographic studies, pulse oximetry and re-evaluation of patient's condition.   Medications Ordered in ED Medications  0.9 %  sodium chloride infusion ( Intravenous New Bag/Given 08/31/17 2112)  piperacillin-tazobactam (ZOSYN) IVPB 3.375 g (not administered)  vancomycin (VANCOCIN) 2,000 mg in sodium chloride 0.9 % 500 mL IVPB (2,000 mg Intravenous New Bag/Given 08/31/17 2202)  vancomycin (VANCOCIN) 1,250 mg in sodium chloride 0.9 % 250 mL IVPB (not administered)  vancomycin (VANCOCIN) 1-5 GM/200ML-% IVPB (not administered)  acetaminophen (TYLENOL) tablet 650 mg (650 mg Oral Given 08/31/17 1949)  piperacillin-tazobactam (ZOSYN) IVPB 3.375 g (0 g Intravenous Stopped 08/31/17 2157)  sodium chloride 0.9 % bolus 1,000 mL (1,000 mLs Intravenous New Bag/Given 08/31/17 2112)  iopamidol (ISOVUE-300) 61 % injection 100 mL (100 mLs Intravenous Contrast Given 08/31/17 2139)     Initial Impression / Assessment and Plan / ED Course  I have reviewed the triage vital signs and the nursing notes.  Pertinent labs & imaging results that were available during my care of the patient were reviewed by me and considered in my medical decision making (see chart for details).    Upon presentation patient's vital signs meeting sepsis criteria.  Temp 100.9.  Respiratory rate up to 28.  Blood pressure not hypotensive systolic was 107.  Not tachycardic.  Patient with a complaint fever, chills, dysuria, shortness of breath.  In the past patient's had trouble with urinary tract infections and pneumonia.  Patient does have oxygen at home normally on 2 L nasal cannula.  Currently requiring 3 L.  Oxygen saturation about 90%.  At times it will drop below 90%.  Temperature did go  up to 101.  Based on this sepsis criteria was initiated did not require the 30 cc/kg fluid challenge because lactic acid less than 4 and not hypotensive.  However he was given a liter of fluid.  Chest x-ray showed revealed no abnormalities.  Went on to have CT of chest just to rule out process secondary to increased oxygen requirement.  Urinalysis was positive for urinary tract infection.  Blood cultures and urine culture pending.  Patient initially treated with broad-spectrum antibiotics based on the sepsis protocol which would have been Zosyn and vancomycin.  Patient's lactic acid was 2.4.  Patient will require admission for sepsis.  Continued antibiotic treatment for probably urinary tract infection or prostate infection.  Patient known to have bad COPD.  Not known to have CHF.   Final Clinical Impressions(s) / ED Diagnoses   Final diagnoses:  Sepsis, due to unspecified organism [facility])  Acute cystitis without hematuria    ED Discharge Orders    None       Vanetta Mulders, MD 08/31/17 2025    Vanetta Mulders, MD 08/31/17 [phone]

## 2017-08-31 NOTE — ED Triage Notes (Signed)
Pt c/o chills-feels like he has a UTI of pneumonia-pt on 3LNC-normal is 2LNC-increased x 2 days-pt presents to triage with SOB

## 2017-08-31 NOTE — ED Notes (Signed)
Pt assisted to bedside commode. Pt unable to provide single urine sample. Reports when he has to urinate, the pain and pressure also makes him have a bowel movement. Pt with increased SOB with exertion. Pt put on 4L [place]

## 2017-08-31 NOTE — ED Notes (Signed)
Patient transported to X-ray 

## 2017-08-31 NOTE — Progress Notes (Signed)
Pharmacy Antibiotic Note  [name] is a 77 y.o. male admitted on 08/31/2017 with sepsis.  Pharmacy has been consulted for vancomycin and Zosyn dosing.  Given one time doses in the ED. SCr 1.1, CrCl ~55ml/min  Plan: Start Zosyn 3.375 gm IV q8h (4 hour infusion) Start vancomycin 1,250mg  IV Q12h Monitor clinical picture, renal function, VT prn F/U C&S, abx deescalation / LOT   Height: 6\' 3"  (190.5 cm) Weight: 211 lb (95.7 kg) IBW/kg (Calculated) : 84.5  Temp (24hrs), Avg:101 F (38.3 C), Min:100.9 F (38.3 C), Max:101 F (38.3 C)  Recent Labs  Lab 08/31/17 1953 08/31/17 2005 08/31/17 2052  WBC 10.8*  --   --   CREATININE  --  1.10  --   LATICACIDVEN  --   --  2.48*    Estimated Creatinine Clearance: 68.3 mL/min (by C-G formula based on SCr of 1.1 mg/dL).    No Known Allergies   Thank you for allowing pharmacy to be a part of this patient's care.  2053 08/31/2017 8:56 PM

## 2017-08-31 NOTE — ED Notes (Signed)
1st set blood culture obtained 2045 from [facility], 2nd set obtained 2100 from [facility]

## 2017-08-31 NOTE — ED Notes (Signed)
Blood culture

## 2017-09-01 DIAGNOSIS — I110 Hypertensive heart disease with heart failure: Secondary | ICD-10-CM | POA: Diagnosis present

## 2017-09-01 DIAGNOSIS — E7800 Pure hypercholesterolemia, unspecified: Secondary | ICD-10-CM | POA: Diagnosis not present

## 2017-09-01 DIAGNOSIS — E119 Type 2 diabetes mellitus without complications: Secondary | ICD-10-CM | POA: Diagnosis present

## 2017-09-01 DIAGNOSIS — J449 Chronic obstructive pulmonary disease, unspecified: Secondary | ICD-10-CM | POA: Diagnosis present

## 2017-09-01 DIAGNOSIS — N390 Urinary tract infection, site not specified: Secondary | ICD-10-CM | POA: Diagnosis not present

## 2017-09-01 DIAGNOSIS — I5042 Chronic combined systolic (congestive) and diastolic (congestive) heart failure: Secondary | ICD-10-CM | POA: Diagnosis present

## 2017-09-01 DIAGNOSIS — D72829 Elevated white blood cell count, unspecified: Secondary | ICD-10-CM | POA: Diagnosis present

## 2017-09-01 DIAGNOSIS — H9190 Unspecified hearing loss, unspecified ear: Secondary | ICD-10-CM | POA: Diagnosis present

## 2017-09-01 DIAGNOSIS — E038 Other specified hypothyroidism: Secondary | ICD-10-CM | POA: Diagnosis not present

## 2017-09-01 DIAGNOSIS — I2510 Atherosclerotic heart disease of native coronary artery without angina pectoris: Secondary | ICD-10-CM

## 2017-09-01 DIAGNOSIS — I10 Essential (primary) hypertension: Secondary | ICD-10-CM | POA: Diagnosis not present

## 2017-09-01 DIAGNOSIS — R251 Tremor, unspecified: Secondary | ICD-10-CM | POA: Diagnosis present

## 2017-09-01 DIAGNOSIS — J8410 Pulmonary fibrosis, unspecified: Secondary | ICD-10-CM | POA: Diagnosis present

## 2017-09-01 DIAGNOSIS — R0602 Shortness of breath: Secondary | ICD-10-CM | POA: Diagnosis not present

## 2017-09-01 DIAGNOSIS — I4891 Unspecified atrial fibrillation: Secondary | ICD-10-CM | POA: Diagnosis not present

## 2017-09-01 DIAGNOSIS — I719 Aortic aneurysm of unspecified site, without rupture: Secondary | ICD-10-CM | POA: Diagnosis not present

## 2017-09-01 DIAGNOSIS — R000 Tachycardia, unspecified: Secondary | ICD-10-CM | POA: Diagnosis not present

## 2017-09-01 DIAGNOSIS — Z794 Long term (current) use of insulin: Secondary | ICD-10-CM

## 2017-09-01 DIAGNOSIS — A419 Sepsis, unspecified organism: Secondary | ICD-10-CM | POA: Diagnosis present

## 2017-09-01 DIAGNOSIS — F419 Anxiety disorder, unspecified: Secondary | ICD-10-CM | POA: Diagnosis present

## 2017-09-01 DIAGNOSIS — F329 Major depressive disorder, single episode, unspecified: Secondary | ICD-10-CM | POA: Diagnosis present

## 2017-09-01 DIAGNOSIS — N400 Enlarged prostate without lower urinary tract symptoms: Secondary | ICD-10-CM | POA: Diagnosis present

## 2017-09-01 DIAGNOSIS — Z9981 Dependence on supplemental oxygen: Secondary | ICD-10-CM | POA: Diagnosis not present

## 2017-09-01 DIAGNOSIS — J9611 Chronic respiratory failure with hypoxia: Secondary | ICD-10-CM | POA: Diagnosis present

## 2017-09-01 DIAGNOSIS — F32A Depression, unspecified: Secondary | ICD-10-CM | POA: Diagnosis present

## 2017-09-01 DIAGNOSIS — I5022 Chronic systolic (congestive) heart failure: Secondary | ICD-10-CM | POA: Diagnosis not present

## 2017-09-01 DIAGNOSIS — E118 Type 2 diabetes mellitus with unspecified complications: Secondary | ICD-10-CM

## 2017-09-01 DIAGNOSIS — N3000 Acute cystitis without hematuria: Secondary | ICD-10-CM | POA: Diagnosis present

## 2017-09-01 DIAGNOSIS — I255 Ischemic cardiomyopathy: Secondary | ICD-10-CM | POA: Diagnosis present

## 2017-09-01 DIAGNOSIS — G4733 Obstructive sleep apnea (adult) (pediatric): Secondary | ICD-10-CM | POA: Diagnosis present

## 2017-09-01 DIAGNOSIS — I480 Paroxysmal atrial fibrillation: Secondary | ICD-10-CM | POA: Diagnosis present

## 2017-09-01 DIAGNOSIS — R509 Fever, unspecified: Secondary | ICD-10-CM | POA: Diagnosis present

## 2017-09-01 DIAGNOSIS — I712 Thoracic aortic aneurysm, without rupture: Secondary | ICD-10-CM | POA: Diagnosis present

## 2017-09-01 DIAGNOSIS — E039 Hypothyroidism, unspecified: Secondary | ICD-10-CM | POA: Diagnosis present

## 2017-09-01 DIAGNOSIS — I447 Left bundle-branch block, unspecified: Secondary | ICD-10-CM | POA: Diagnosis present

## 2017-09-01 DIAGNOSIS — M069 Rheumatoid arthritis, unspecified: Secondary | ICD-10-CM | POA: Diagnosis present

## 2017-09-01 DIAGNOSIS — E785 Hyperlipidemia, unspecified: Secondary | ICD-10-CM | POA: Diagnosis present

## 2017-09-01 DIAGNOSIS — B952 Enterococcus as the cause of diseases classified elsewhere: Secondary | ICD-10-CM | POA: Diagnosis present

## 2017-09-01 LAB — CREATININE, SERUM
CREATININE: 0.9 mg/dL (ref 0.61–1.24)
GFR calc Af Amer: >60 mL/min (ref >60)

## 2017-09-01 LAB — LACTIC ACID, PLASMA
LACTIC ACID, VENOUS: 1.5 mmol/L (ref 0.5–1.9)
LACTIC ACID, VENOUS: 2.6 mmol/L — AB (ref 0.5–1.9)

## 2017-09-01 LAB — CBC
HCT: 35.2 % — ABNORMAL LOW (ref 39.0–52.0)
HEMOGLOBIN: 11.5 g/dL — AB (ref 13.0–17.0)
MCH: 27.3 pg (ref 26.0–34.0)
MCHC: 32.7 g/dL (ref 30.0–36.0)
MCV: 83.4 fL (ref 78.0–100.0)
PLATELETS: 124 10*3/uL — AB (ref 150–400)
RBC: 4.22 MIL/uL (ref 4.22–5.81)
RDW: 15 % (ref 11.5–15.5)
WBC: 8.7 10*3/uL (ref 4.0–10.5)

## 2017-09-01 LAB — PROTIME-INR
INR: 1.07
PROTHROMBIN TIME: 13.8 s (ref 11.4–15.2)

## 2017-09-01 LAB — GLUCOSE, CAPILLARY: GLUCOSE-CAPILLARY: 253 mg/dL — AB (ref 65–99)

## 2017-09-01 LAB — APTT: aPTT: 30 seconds (ref 24–36)

## 2017-09-01 LAB — PROCALCITONIN: Procalcitonin: <0.1 ng/mL

## 2017-09-01 MED ORDER — VANCOMYCIN HCL 500 MG IV SOLR
INTRAVENOUS | Status: AC
  Filled 2017-09-01: qty 1500

## 2017-09-01 MED ORDER — DEXTROSE 5 % IV SOLN: 2.0000 g | INTRAVENOUS | Status: DC

## 2017-09-01 MED ORDER — ATORVASTATIN CALCIUM 40 MG PO TABS
40.0000 mg | ORAL_TABLET | Freq: Every day | ORAL | Status: DC
  Administered 2017-09-01 – 2017-09-05 (×5): 40 mg via ORAL
  Filled 2017-09-01 (×5): qty 1

## 2017-09-01 MED ORDER — ALPRAZOLAM 0.5 MG PO TABS
0.5000 mg | ORAL_TABLET | Freq: Two times a day (BID) | ORAL | Status: DC
  Administered 2017-09-01 – 2017-09-05 (×8): 0.5 mg via ORAL
  Filled 2017-09-01 (×8): qty 1

## 2017-09-01 MED ORDER — SPIRONOLACTONE 12.5 MG HALF TABLET
12.5000 mg | ORAL_TABLET | Freq: Every day | ORAL | Status: DC
  Administered 2017-09-02 – 2017-09-05 (×4): 12.5 mg via ORAL
  Filled 2017-09-01 (×4): qty 1

## 2017-09-01 MED ORDER — ALBUTEROL SULFATE (2.5 MG/3ML) 0.083% IN NEBU
2.5000 mg | INHALATION_SOLUTION | Freq: Four times a day (QID) | RESPIRATORY_TRACT | Status: DC | PRN
  Administered 2017-09-05: 2.5 mg via RESPIRATORY_TRACT
  Filled 2017-09-01: qty 3

## 2017-09-01 MED ORDER — VITAMIN D 1000 UNITS PO TABS
2000.0000 [IU] | ORAL_TABLET | Freq: Every day | ORAL | Status: DC
  Administered 2017-09-01 – 2017-09-05 (×5): 2000 [IU] via ORAL
  Filled 2017-09-01 (×5): qty 2

## 2017-09-01 MED ORDER — INSULIN GLARGINE 100 UNIT/ML ~~LOC~~ SOLN
30.0000 [IU] | Freq: Every day | SUBCUTANEOUS | Status: DC
  Administered 2017-09-01: 30 [IU] via SUBCUTANEOUS
  Filled 2017-09-01: qty 0.3

## 2017-09-01 MED ORDER — TAMSULOSIN HCL 0.4 MG PO CAPS
0.8000 mg | ORAL_CAPSULE | Freq: Every day | ORAL | Status: DC
  Administered 2017-09-01 – 2017-09-04 (×4): 0.8 mg via ORAL
  Filled 2017-09-01 (×4): qty 2

## 2017-09-01 MED ORDER — LOSARTAN POTASSIUM 50 MG PO TABS
50.0000 mg | ORAL_TABLET | Freq: Every day | ORAL | Status: DC
  Administered 2017-09-01 – 2017-09-03 (×3): 50 mg via ORAL
  Filled 2017-09-01 (×4): qty 1

## 2017-09-01 MED ORDER — CLOPIDOGREL BISULFATE 75 MG PO TABS
75.0000 mg | ORAL_TABLET | Freq: Every day | ORAL | Status: DC
  Administered 2017-09-02: 75 mg via ORAL
  Filled 2017-09-01: qty 1

## 2017-09-01 MED ORDER — VANCOMYCIN HCL 10 G IV SOLR
1500.0000 mg | Freq: Once | INTRAVENOUS | Status: AC
  Administered 2017-09-01: 1500 mg via INTRAVENOUS
  Filled 2017-09-01: qty 1500

## 2017-09-01 MED ORDER — ONDANSETRON HCL 4 MG PO TABS: 4.0000 mg | ORAL_TABLET | Freq: Three times a day (TID) | ORAL | Status: DC | PRN

## 2017-09-01 MED ORDER — SERTRALINE HCL 50 MG PO TABS
50.0000 mg | ORAL_TABLET | Freq: Every day | ORAL | Status: DC
  Administered 2017-09-02 – 2017-09-05 (×4): 50 mg via ORAL
  Filled 2017-09-01 (×4): qty 1

## 2017-09-01 MED ORDER — ACETAMINOPHEN 325 MG PO TABS: 650.0000 mg | ORAL_TABLET | Freq: Every day | ORAL | Status: DC | PRN

## 2017-09-01 MED ORDER — PNEUMOCOCCAL VAC POLYVALENT 25 MCG/0.5ML IJ INJ
0.5000 mL | INJECTION | INTRAMUSCULAR | Status: DC
  Filled 2017-09-01: qty 0.5

## 2017-09-01 MED ORDER — PREDNISONE 5 MG PO TABS
10.0000 mg | ORAL_TABLET | Freq: Every day | ORAL | Status: DC
  Administered 2017-09-02 – 2017-09-05 (×4): 10 mg via ORAL
  Filled 2017-09-01 (×2): qty 1
  Filled 2017-09-01: qty 2
  Filled 2017-09-01: qty 1

## 2017-09-01 MED ORDER — SODIUM CHLORIDE 0.9 % IV BOLUS (SEPSIS)
500.0000 mL | Freq: Once | INTRAVENOUS | Status: AC
  Administered 2017-09-01: 500 mL via INTRAVENOUS

## 2017-09-01 MED ORDER — BISOPROLOL FUMARATE 5 MG PO TABS
5.0000 mg | ORAL_TABLET | Freq: Every day | ORAL | Status: DC
  Administered 2017-09-01 – 2017-09-02 (×3): 5 mg via ORAL
  Filled 2017-09-01 (×3): qty 1

## 2017-09-01 MED ORDER — TIOTROPIUM BROMIDE MONOHYDRATE 18 MCG IN CAPS
18.0000 ug | ORAL_CAPSULE | Freq: Every day | RESPIRATORY_TRACT | Status: DC
  Administered 2017-09-02 – 2017-09-05 (×4): 18 ug via RESPIRATORY_TRACT
  Filled 2017-09-01: qty 5

## 2017-09-01 MED ORDER — ENOXAPARIN SODIUM 40 MG/0.4ML ~~LOC~~ SOLN
40.0000 mg | SUBCUTANEOUS | Status: DC
  Administered 2017-09-01: 40 mg via SUBCUTANEOUS
  Filled 2017-09-01: qty 0.4

## 2017-09-01 MED ORDER — INSULIN ASPART 100 UNIT/ML ~~LOC~~ SOLN
0.0000 [IU] | Freq: Three times a day (TID) | SUBCUTANEOUS | Status: DC
  Administered 2017-09-01: 3 [IU] via SUBCUTANEOUS
  Administered 2017-09-02: 7 [IU] via SUBCUTANEOUS

## 2017-09-01 MED ORDER — ASPIRIN EC 81 MG PO TBEC
81.0000 mg | DELAYED_RELEASE_TABLET | Freq: Every day | ORAL | Status: DC
  Administered 2017-09-01 – 2017-09-05 (×5): 81 mg via ORAL
  Filled 2017-09-01 (×5): qty 1

## 2017-09-01 MED ORDER — VANCOMYCIN HCL 10 G IV SOLR
1250.0000 mg | Freq: Two times a day (BID) | INTRAVENOUS | Status: DC
  Administered 2017-09-01: 1250 mg via INTRAVENOUS
  Filled 2017-09-01 (×3): qty 1250

## 2017-09-01 MED ORDER — SEMAGLUTIDE(0.25 OR 0.5MG/DOS) 2 MG/1.5ML ~~LOC~~ SOPN: 0.2500 mg | PEN_INJECTOR | SUBCUTANEOUS | Status: DC

## 2017-09-01 MED ORDER — LEVOTHYROXINE SODIUM 100 MCG PO TABS
200.0000 ug | ORAL_TABLET | Freq: Every day | ORAL | Status: DC
  Administered 2017-09-02: 200 ug via ORAL
  Filled 2017-09-01: qty 2

## 2017-09-01 NOTE — H&P (Signed)
History and Physical    [name] LFY:[id number] DOB: [birth year] DOA: 08/31/2017  Referring MD/NP/PA: Dr. Rogene Houston   PCP: Shelda Pal, DO   Outpatient Specialists: Dr. Kara Mead, Pulmonary   Patient coming from: home, [facility]  Chief Complaint: chills  HPI: [name] is a 77 y.o. male with medical history significant for anxiety and depression, rheumatoid arthritis, dyslipidemia, hypertension, hypothyroidism, DM (on metformin and Lantus), pulmonary fibrosis, OSA on CPAP, COPD who presented to [facility] with fevers, chills and burning sensation on urination. Pt also reported increasing shortness of breath at rest and with exertion and is usually on 2 L [place] oxygen support but has required 3 L [place] oxygen support for shortness of breath. No cough. No chest pain. Pt does endorse weakness. No lightheadedness or loss of consciousness. No reports of abdominal pain, nausea or vomiting. No blood in stool or urine.   ED Course: In ED, BP was 107/90, HR 92, RR 15-28, T max 101F and oxygen saturation 90% on 3 L  oxygen support. UA showed large leukocytes. CT chest showed nodular opacities within the left lung improved from the prior study, reflecting improving chronic infection. Chronic biapical scarring, with associated diffuse right-sided pleural calcification.   Pt started on empiric abx while awaiting culture results.    Review of Systems:  Constitutional: Positive for fever, chills, no diaphoresis, activity change, appetite change and fatigue.  HENT: Negative for ear pain, nosebleeds, congestion, facial swelling, rhinorrhea, neck pain, neck stiffness and ear discharge.   Eyes: Negative for pain, discharge, redness, itching and visual disturbance.  Respiratory:positive for shortness of breath, no cough or stridor Cardiovascular: Negative for chest pain, palpitations and leg swelling.  Gastrointestinal: Negative for abdominal distention.  Genitourinary: Negative for  dysuria, urgency, frequency, hematuria, flank pain, decreased urine volume, difficulty urinating and dyspareunia.  Musculoskeletal: Negative for back pain, joint swelling, arthralgias and gait problem.  Neurological: Negative for dizziness, tremors, seizures, syncope, facial asymmetry, speech difficulty, light-headedness, numbness and headaches.  Hematological: Negative for adenopathy. Does not bruise/bleed easily.  Psychiatric/Behavioral: Negative for hallucinations, behavioral problems, confusion, dysphoric mood, decreased concentration and agitation.   Past Medical History:  Diagnosis Date  . Anxiety   . BENIGN PROSTATIC HYPERTROPHY 11/23/2009  . Cardiomyopathy ([place]) 08/28/2016  . COPD (chronic obstructive pulmonary disease) ([place])   . CORONARY ARTERY DISEASE 11/23/2009  . 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 DEGENERATIVE JOINT DISEASE 11/23/2009  . DEPRESSION 11/23/2009  . FATIGUE 11/23/2009  . GAIT DISTURBANCE 12/10/2009  . HEMOPTYSIS UNSPECIFIED 05/07/2010  . High cholesterol   . HYPERTENSION 07/30/2009  . HYPOTHYROIDISM 07/30/2009  . 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 On home oxygen therapy    "2-3L; 24/7" (09/10/2016)  . OSA on CPAP   . PTSD (post-traumatic stress disorder) 03/10/2012  . 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 RA (rheumatoid arthritis) ([place]) 06/11/2011   "qwhere" (09/10/2016)  . RESPIRATORY FAILURE, CHRONIC 07/31/2009  . Scleritis of both eyes 03/17/2014  . 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 Type II diabetes mellitus ([place])     Past Surgical History:  Procedure Laterality Date  . 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 ABDOMINAL EXPLORATION SURGERY  02/2004   w/LOA/notes 12/10/2010; small bowel obstruction repair with adhesiolysis   . BACK SURGERY    . CARDIAC CATHETERIZATION     2 heart caths in the past.  One in 2000s showed one ulcerated plaque  Rx medically; Second at [facility] Archie Endo 09/05/2016  . CATARACT EXTRACTION  W/ INTRAOCULAR LENS  IMPLANT, BILATERAL  Bilateral 2000s  . COLECTOMY     hx of remote ileum resection due to bleeding  . CORONARY ANGIOPLASTY WITH STENT PLACEMENT  09/10/2016  . CORONARY STENT INTERVENTION N/A 09/10/2016   Procedure: Coronary Stent Intervention;  Surgeon: Burnell Blanks, MD;  Location: [place] [facility];  Service: Cardiovascular;  Laterality: N/A;  Distal RCA 4.0x16 Synergy  . FEMORAL EMBOLOECTOMY Left 07/2000   with left leg ischemia; Dr. Kellie Simmering, vascular  . GANGLION CYST EXCISION Right    "wrist"; Dr. Daylene Katayama  . LUMBAR LAMINECTOMY  1972   Dr. Collie Siad  . RIGHT/LEFT HEART CATH AND CORONARY ANGIOGRAPHY N/A 09/10/2016   Procedure: Right/Left Heart Cath and Coronary Angiography;  Surgeon: Burnell Blanks, MD;  Location: [place] [facility];  Service: Cardiovascular;  Laterality: N/A;  . TONSILLECTOMY      Social history:  reports that he quit smoking about 19 years ago. His smoking use included cigarettes, pipe, and cigars. He has a 100.00 pack-year smoking history. he has never used smokeless tobacco. He reports that he does not drink alcohol or use drugs.  Ambulation: ambulates without assistance at baseline   No Known Allergies  Family History  Problem Relation Age of Onset  . Other Mother        gun shot    Prior to Admission medications   Medication Sig Start Date End Date Taking? Authorizing Provider  acetaminophen (TYLENOL) 325 MG tablet Take 650 mg by mouth daily as needed for moderate pain or headache.    Yes [provider]  ALPRAZolam Duanne Moron) 0.5 MG tablet Take 0.5 mg by mouth 2 (two) times daily.   Yes [provider]  aspirin EC 81 MG tablet Take 1 tablet (81 mg total) by mouth daily. 09/17/16  Yes Bhagat, Bhavinkumar, PA  atorvastatin (LIPITOR) 40 MG tablet Take 1 tablet (40 mg total) by mouth daily. 10/21/16  Yes Fay Records, MD  bisoprolol (ZEBETA) 5 MG tablet Take 1 tablet (5 mg total) by mouth daily. 09/11/16  Yes Strader, Fransisco Hertz, PA-C  Cholecalciferol  (VITAMIN D3) 2000 units capsule Take 2,000 Units by mouth daily.  01/11/16  Yes [provider]  clopidogrel (PLAVIX) 75 MG tablet Take 1 tablet (75 mg total) by mouth daily with breakfast. 09/11/16  Yes Strader, [place] M, PA-C  levothyroxine (SYNTHROID, LEVOTHROID) 200 MCG tablet Take 200 mcg by mouth daily before breakfast.   Yes [provider]  losartan (COZAAR) 50 MG tablet Take 1 tablet (50 mg total) by mouth daily. 08/24/13  Yes Domenic Polite, MD  magnesium (MAGTAB) 84 MG (7MEQ) TBCR SR tablet Take 168 mg by mouth at bedtime.   Yes [provider]  metFORMIN (GLUCOPHAGE-XR) 750 MG 24 hr tablet Take 750 mg by mouth 2 (two) times daily.   Yes [provider]  Multiple Vitamins-Minerals (MEGA MULTIVITAMIN FOR MEN PO) Take 1 tablet by mouth daily.    Yes [provider]  predniSONE (DELTASONE) 10 MG tablet 2 tabs daily for 1 week, then 1 tab daily until seen back . Patient taking differently: Take 10 mg by mouth daily with breakfast.  07/16/17  Yes Parrett, Tammy S, NP  spironolactone (ALDACTONE) 25 MG tablet Take 0.5 tablets (12.5 mg total) by mouth daily. 12/31/16  Yes Shelda Pal, DO  tiotropium (SPIRIVA HANDIHALER) 18 MCG inhalation capsule Place 1 capsule (18 mcg total) into inhaler and inhale daily. 07/16/17  Yes Parrett, Fonnie Mu, NP  albuterol (PROAIR HFA) 108 (90 BASE) MCG/ACT inhaler Inhale 2 puffs into the lungs every 4 (four) hours as needed. Shortness of breath Patient not taking: Reported on 09/01/2017 10/09/11   Brand Males, MD  albuterol (PROVENTIL) (2.5 MG/3ML) 0.083% nebulizer solution 1 vial in nebulizer every 6 hours as needed Dx 496 Patient taking differently: Take 2.5 mg by nebulization every 6 (six) hours as needed for wheezing or shortness of breath. 1 vial in nebulizer every 6 hours as needed Dx 496 05/31/12   Brand Males, MD  Carboxymethylcellul-Glycerin (LUBRICATING EYE DROPS OP) Apply 1 drop to eye daily as  needed (dry eyes).    [provider]  furosemide (LASIX) 40 MG tablet TK 1 T PO D PRN FOR FLUID OR EDEMA 09/11/16   [provider]  insulin glargine (LANTUS) 100 UNIT/ML injection Inject 0.55 mLs (55 Units total) into the skin at bedtime. Patient taking differently: Inject 60 Units into the skin at bedtime.  12/28/16   Florencia Reasons, MD  levofloxacin (LEVAQUIN) 500 MG tablet Take 1 tablet (500 mg total) daily by mouth. Patient not taking: Reported on 09/01/2017 06/15/17   Pattricia Boss, MD  levothyroxine (SYNTHROID, LEVOTHROID) 125 MCG tablet Take 125 mcg by mouth daily before breakfast.     [provider]  ondansetron (ZOFRAN) 4 MG tablet Take 1 tablet (4 mg total) every 8 (eight) hours as needed by mouth for nausea or vomiting. 06/03/17   Shelda Pal, DO  OXYGEN Inhale 2 L into the lungs continuous.    [provider]  predniSONE (STERAPRED UNI-PAK 21 TAB) 10 MG (21) TBPK tablet Take daily by mouth. Take 6 tabs by mouth daily  for 2 days, then 5 tabs for 2 days, then 4 tabs for 2 days, then 3 tabs for 2 days, 2 tabs for 2 days, then 1 tab by mouth daily for 2 days Patient not taking: Reported on 09/01/2017 06/15/17   Pattricia Boss, MD  pregabalin (LYRICA) 50 MG capsule Take 50 mg by mouth 2 (two) times daily.    [provider]  Semaglutide (OZEMPIC) 0.25 or 0.5 MG/DOSE SOPN Inject 0.25-0.5 mg into the skin every 7 (seven) days.     [provider]  sertraline (ZOLOFT) 100 MG tablet Take 50 mg by mouth daily.     [provider]  tamsulosin (FLOMAX) 0.4 MG CAPS capsule Take 2 capsules (0.8 mg total) by mouth at bedtime. 12/28/16   Florencia Reasons, MD    Physical Exam: Vitals:   09/01/17 1300 09/01/17 1400 09/01/17 1421 09/01/17 1500  BP: 128/64 (!) 194/84  (!) 122/55  Pulse: 76 88  71  Resp: 20 19  19   Temp:      TempSrc:      SpO2: 94% 94% 98% 98%  Weight:      Height:        Constitutional: NAD, calm, comfortable Vitals:    09/01/17 1300 09/01/17 1400 09/01/17 1421 09/01/17 1500  BP: 128/64 (!) 194/84  (!) 122/55  Pulse: 76 88  71  Resp: 20 19  19   Temp:      TempSrc:      SpO2: 94% 94% 98% 98%  Weight:      Height:       Eyes: PERRL, lids and conjunctivae normal ENMT: Mucous membranes are moist. Posterior pharynx clear of any exudate or lesions.Normal dentition.  Neck: normal, supple, no masses, no thyromegaly Respiratory: no wheezing, no rhonchi  Cardiovascular: Regular rate and rhythm,  no murmurs / rubs / gallops. No extremity edema. 2+ pedal pulses. No carotid bruits.  Abdomen: no tenderness, no masses palpated. No hepatosplenomegaly. Bowel sounds positive.  Musculoskeletal: no clubbing / cyanosis. No joint deformity upper and lower extremities. Good ROM, no contractures. Normal muscle tone.  Skin: no rashes, lesions, ulcers. No induration Neurologic: CN 2-12 grossly intact. Sensation intact, DTR normal. Strength 5/5 in all 4.  Psychiatric: Normal judgment and insight. Alert and oriented x 3. Normal mood.    Labs on Admission: I have personally reviewed following labs and imaging studies  CBC: Recent Labs  Lab 08/31/17 1953 08/31/17 2005  WBC 10.8*  --   NEUTROABS 8.8*  --   HGB 11.7* 11.9*  HCT 35.7* 35.0*  MCV 84.6  --   PLT 129*  --    Basic Metabolic Panel: Recent Labs  Lab 08/31/17 2005  NA 135  K 4.5  CL 99*  GLUCOSE 244*  BUN 20  CREATININE 1.10   GFR: Estimated Creatinine Clearance: 68.3 mL/min (by C-G formula based on SCr of 1.1 mg/dL). Liver Function Tests: Recent Labs  Lab 08/31/17 2055  AST 25  ALT 24  ALKPHOS 57  BILITOT 0.7  PROT 6.6  ALBUMIN 3.9   No results for input(s): LIPASE, AMYLASE in the last 168 hours. No results for input(s): AMMONIA in the last 168 hours. Coagulation Profile: No results for input(s): INR, PROTIME in the last 168 hours. Cardiac Enzymes: No results for input(s): CKTOTAL, CKMB, CKMBINDEX, TROPONINI in the last 168 hours. BNP  (last 3 results) Recent Labs    10/20/16 1038  PROBNP 188   HbA1C: No results for input(s): HGBA1C in the last 72 hours. CBG: No results for input(s): GLUCAP in the last 168 hours. Lipid Profile: No results for input(s): CHOL, HDL, LDLCALC, TRIG, CHOLHDL, LDLDIRECT in the last 72 hours. Thyroid Function Tests: No results for input(s): TSH, T4TOTAL, FREET4, T3FREE, THYROIDAB in the last 72 hours. Anemia Panel: No results for input(s): VITAMINB12, FOLATE, FERRITIN, TIBC, IRON, RETICCTPCT in the last 72 hours. Urine analysis:    Component Value Date/Time   COLORURINE ORANGE (A) 08/31/2017 2145   APPEARANCEUR TURBID (A) 08/31/2017 2145   LABSPEC 1.010 08/31/2017 2145   PHURINE 5.5 08/31/2017 2145   GLUCOSEU >=500 (A) 08/31/2017 2145   GLUCOSEU 100 (A) 10/02/2016 1045   HGBUR SMALL (A) 08/31/2017 2145   BILIRUBINUR NEGATIVE 08/31/2017 2145   BILIRUBINUR negative 07/06/2014 1704   KETONESUR 15 (A) 08/31/2017 2145   PROTEINUR 100 (A) 08/31/2017 2145   UROBILINOGEN 0.2 10/02/2016 1045   NITRITE POSITIVE (A) 08/31/2017 2145   LEUKOCYTESUR LARGE (A) 08/31/2017 2145   Sepsis Labs: @LABRCNTIP (procalcitonin:4,lacticidven:4) ) Recent Results (from the past 240 hour(s))  Blood Culture (routine x 2)     Status: None (Preliminary result)   Collection Time: 08/31/17  8:45 PM  Result Value Ref Range Status   Specimen Description   Final    BLOOD LEFT ANTECUBITAL Performed at [facility], [address] [place], [place] [address]    Special Requests   Final    BOTTLES DRAWN AEROBIC AND ANAEROBIC Blood Culture adequate volume Performed at [facility], [address] [place], [place] [address]    Culture   Final    NO GROWTH < 24 HOURS Performed at [facility], [place] [address], [place], [place] [address]    Report Status PENDING  Incomplete  Blood Culture (routine x 2)  Status: None (Preliminary result)   Collection Time: 08/31/17  9:00 PM  Result  Value Ref Range Status   Specimen Description   Final    BLOOD RIGHT ANTECUBITAL Performed at [facility], [address] [place], [place] [address]    Special Requests   Final    BOTTLES DRAWN AEROBIC AND ANAEROBIC Blood Culture adequate volume Performed at [facility], [address] [place], [place] [address]    Culture   Final    NO GROWTH < 24 HOURS Performed at [facility], [place] [address], [place], [place] [address]    Report Status PENDING  Incomplete     Radiological Exams on Admission: Dg Chest 2 View  Result Date: 08/31/2017 CLINICAL DATA:  Shortness of breath and chills EXAM: CHEST  2 VIEW COMPARISON:  July 16, 2017 and Dec 25, 2016 FINDINGS: There is scarring throughout portions of the right lung with chronic blunting of the right costophrenic angle. There is also chronic blunting of the left costophrenic angle with scarring in the left base. There is no edema or consolidation. The heart size and pulmonary vascularity are within normal limits. No adenopathy. No evident bone lesions. IMPRESSION: Areas of scarring bilaterally, more on the right than on the left. Blunting of the costophrenic angles bilaterally, chronic. No edema or consolidation. Stable cardiac silhouette. Electronically Signed   By: Lowella Grip III M.D.   On: 08/31/2017 20:28   Ct Chest W Contrast  Result Date: 08/31/2017 CLINICAL DATA:  Acute onset of shortness of breath and chills. EXAM: CT CHEST WITH CONTRAST TECHNIQUE: Multidetector CT imaging of the chest was performed during intravenous contrast administration. CONTRAST:  170m ISOVUE-300 IOPAMIDOL (ISOVUE-300) INJECTION 61% COMPARISON:  Chest radiograph performed earlier today 7:53 p.m., and CT of the chest performed 06/12/2017 FINDINGS: Cardiovascular: The heart is normal in size. Scattered coronary artery calcifications are seen. There is mild focal aneurysmal dilatation of the aortic arch to 3.7 cm in diameter, with  mild underlying calcification. The great vessels demonstrate scattered calcification but are otherwise grossly unremarkable. Mediastinum/Nodes: The mediastinum is otherwise unremarkable in appearance. No mediastinal lymphadenopathy is seen. No pericardial effusion is identified. The thyroid gland is not well characterized. No axillary lymphadenopathy is seen. Lungs/Pleura: Chronic scarring is noted at the lung apices, with associated calcification on the right. Additional nodular left-sided airspace opacities have improved from the prior study, reflecting improving chronic infection. Diffuse right-sided pleural calcifications are seen. No pleural effusion or pneumothorax is seen. Upper Abdomen: The visualized portions of the liver and spleen are unremarkable. Stones are seen dependently within the gallbladder. The visualized portions of the pancreas, adrenal glands and kidneys are within normal limits. Musculoskeletal: No acute osseous abnormalities are identified. The visualized musculature is unremarkable in appearance. IMPRESSION: 1. Nodular opacities within the left lung have improved from the prior study, reflecting improving chronic infection. 2. Chronic biapical scarring, with associated diffuse right-sided pleural calcification. 3. 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 4. Cholelithiasis.  Gallbladder otherwise unremarkable. Electronically Signed   By: JGarald BaldingM.D.   On: 08/31/2017 22:12    EKG: Pending   Assessment/Plan   Principal Problem:   Sepsis secondary to UTI ([place] / Leukocytosis - Sepsis criteria met on admission with fever, tachycardia, tachypnea, hypoxia, hypotension, leukocytosis - Lactic acid is WNL - UA showed large leukocytes  - CXR showed area of scarring bilatteraly  but no edema or consolidation  - Blood cultures and urine culture ordered - Started broad spectrum antibiotics: vanco and zosyn     Active Problems:   Hypothyroidism - Continue synthroid     Essential hypertension - Continue bisoprolol, losartan, aldactone     Coronary atherosclerosis  - Continue plavix    OSA on CPAP - CPAP at bedtime    Hyperlipidemia - Resume statin therapy     Diabetes mellitus with complication, with long-term current use of insulin (HCC) - Resume lantus at half the home dose, 30 units at bedtime - Add SSI - Will hold metformin for now due to sepsis    Anxiety and depression - Continue Zoloft and xanax     COPD (chronic obstructive pulmonary disease) ([place]) - Not in acute exacerbation - Continue current bronchodilators    DVT prophylaxis: Lovenox subQ Code Status:full code Family Communication: no family at bedside  Disposition Plan: admission to telemetry  Consults called: None   Disposition plan: Further plan will depend as patient's clinical course evolves and further radiologic and laboratory data become available.    At the time of admission, it appears that the appropriate admission status for this patient is INPATIENT .Thisis judged to be reasonable and necessary in order to provide the required intensity of service to ensure the patient's safetygiven the patient presentation of sepsis and UTI in addition to physical exam findings, radiographic and laboratory data in the context of chronic comorbidities.   Time Spent  Critical care time spent : 65 minutes examining the patient, discussing with EDP, critical care physician, coordinating care and management.The medical decision making on this patient was of high complexity, the critically ill patient is at high risk for clinical deterioration, therefore this is a level 3 visit.  Leisa Lenz MD [facility] Pager [phone]  If 7PM-7AM, please contact night-coverage [email or web address] Password [email or web address]  09/01/2017, 5:08 PM

## 2017-09-01 NOTE — Progress Notes (Signed)
New order placed for NS bolus.

## 2017-09-01 NOTE — ED Notes (Signed)
[name] (Son-in-law) [phone]

## 2017-09-01 NOTE — Progress Notes (Signed)
CRITICAL VALUE ALERT  Critical Value:  Lactic Acid 2.6  Date & Time Notied:  09/01/17 2134  Provider Notified: Kirtland Bouchard Schoor  Orders Received/Actions taken: No new orders as of yet

## 2017-09-01 NOTE — Progress Notes (Signed)
Patient arrived via [facility]. [facility] alert and oriented x4. 3L oxygen placed. Admissions was paged. Awaiting for admission orders.

## 2017-09-02 ENCOUNTER — Encounter (HOSPITAL_COMMUNITY): Payer: Self-pay | Admitting: Internal Medicine

## 2017-09-02 ENCOUNTER — Inpatient Hospital Stay (HOSPITAL_COMMUNITY): Payer: Medicare Other

## 2017-09-02 DIAGNOSIS — F329 Major depressive disorder, single episode, unspecified: Secondary | ICD-10-CM

## 2017-09-02 DIAGNOSIS — F419 Anxiety disorder, unspecified: Secondary | ICD-10-CM

## 2017-09-02 DIAGNOSIS — R000 Tachycardia, unspecified: Secondary | ICD-10-CM | POA: Clinically undetermined

## 2017-09-02 DIAGNOSIS — I5022 Chronic systolic (congestive) heart failure: Secondary | ICD-10-CM | POA: Diagnosis present

## 2017-09-02 DIAGNOSIS — I255 Ischemic cardiomyopathy: Secondary | ICD-10-CM | POA: Diagnosis present

## 2017-09-02 DIAGNOSIS — E038 Other specified hypothyroidism: Secondary | ICD-10-CM

## 2017-09-02 DIAGNOSIS — E7800 Pure hypercholesterolemia, unspecified: Secondary | ICD-10-CM

## 2017-09-02 DIAGNOSIS — N390 Urinary tract infection, site not specified: Secondary | ICD-10-CM

## 2017-09-02 DIAGNOSIS — I4891 Unspecified atrial fibrillation: Secondary | ICD-10-CM

## 2017-09-02 DIAGNOSIS — E118 Type 2 diabetes mellitus with unspecified complications: Secondary | ICD-10-CM

## 2017-09-02 DIAGNOSIS — A419 Sepsis, unspecified organism: Principal | ICD-10-CM

## 2017-09-02 DIAGNOSIS — I10 Essential (primary) hypertension: Secondary | ICD-10-CM

## 2017-09-02 DIAGNOSIS — I719 Aortic aneurysm of unspecified site, without rupture: Secondary | ICD-10-CM

## 2017-09-02 DIAGNOSIS — G4733 Obstructive sleep apnea (adult) (pediatric): Secondary | ICD-10-CM

## 2017-09-02 DIAGNOSIS — Z794 Long term (current) use of insulin: Secondary | ICD-10-CM

## 2017-09-02 DIAGNOSIS — Z9989 Dependence on other enabling machines and devices: Secondary | ICD-10-CM

## 2017-09-02 DIAGNOSIS — R0602 Shortness of breath: Secondary | ICD-10-CM | POA: Diagnosis present

## 2017-09-02 HISTORY — DX: Chronic systolic (congestive) heart failure: I50.22

## 2017-09-02 HISTORY — DX: Aortic aneurysm of unspecified site, without rupture: I71.9

## 2017-09-02 HISTORY — DX: Ischemic cardiomyopathy: I25.5

## 2017-09-02 LAB — CBC
HCT: 34.1 % — ABNORMAL LOW (ref 39.0–52.0)
Hemoglobin: 11.1 g/dL — ABNORMAL LOW (ref 13.0–17.0)
MCH: 27.7 pg (ref 26.0–34.0)
MCHC: 32.6 g/dL (ref 30.0–36.0)
MCV: 85 fL (ref 78.0–100.0)
Platelets: 103 10*3/uL — ABNORMAL LOW (ref 150–400)
RBC: 4.01 MIL/uL — AB (ref 4.22–5.81)
RDW: 14.8 % (ref 11.5–15.5)
WBC: 7.9 10*3/uL (ref 4.0–10.5)

## 2017-09-02 LAB — BASIC METABOLIC PANEL
Anion gap: 12 (ref 5–15)
BUN: 23 mg/dL — AB (ref 6–20)
CO2: 24 mmol/L (ref 22–32)
Calcium: 8.8 mg/dL — ABNORMAL LOW (ref 8.9–10.3)
Chloride: 103 mmol/L (ref 101–111)
Creatinine, Ser: 1.17 mg/dL (ref 0.61–1.24)
GFR calc Af Amer: >60 mL/min (ref >60)
GFR calc non Af Amer: 59 mL/min — ABNORMAL LOW (ref >60)
Glucose, Bld: 225 mg/dL — ABNORMAL HIGH (ref 65–99)
POTASSIUM: 3.7 mmol/L (ref 3.5–5.1)
SODIUM: 139 mmol/L (ref 135–145)

## 2017-09-02 LAB — BLOOD GAS, ARTERIAL
Acid-base deficit: 3.8 mmol/L — ABNORMAL HIGH (ref 0.0–2.0)
Bicarbonate: 19.4 mmol/L — ABNORMAL LOW (ref 20.0–28.0)
Drawn by: 295031
O2 Content: 2 L/min
O2 Saturation: 95.9 %
PCO2 ART: 30.5 mmHg — AB (ref 32.0–48.0)
PH ART: 7.42 (ref 7.350–7.450)
Patient temperature: 98.6
pO2, Arterial: 86.9 mmHg (ref 83.0–108.0)

## 2017-09-02 LAB — GLUCOSE, CAPILLARY
GLUCOSE-CAPILLARY: 236 mg/dL — AB (ref 65–99)
Glucose-Capillary: 324 mg/dL — ABNORMAL HIGH (ref 65–99)
Glucose-Capillary: 283 mg/dL — ABNORMAL HIGH (ref 65–99)
Glucose-Capillary: 251 mg/dL — ABNORMAL HIGH (ref 65–99)
Glucose-Capillary: 280 mg/dL — ABNORMAL HIGH (ref 65–99)

## 2017-09-02 LAB — MRSA PCR SCREENING: MRSA BY PCR: NEGATIVE

## 2017-09-02 LAB — ECHOCARDIOGRAM COMPLETE
HEIGHTINCHES: 75 in
Weight: 3276.92 oz

## 2017-09-02 LAB — MAGNESIUM: Magnesium: 1.5 mg/dL — ABNORMAL LOW (ref 1.7–2.4)

## 2017-09-02 LAB — TROPONIN I
TROPONIN I: 0.17 ng/mL — AB (ref <0.03)
TROPONIN I: 0.26 ng/mL — AB (ref <0.03)
Troponin I: 0.04 ng/mL (ref <0.03)

## 2017-09-02 LAB — TSH: TSH: 0.04 u[IU]/mL — ABNORMAL LOW (ref 0.350–4.500)

## 2017-09-02 LAB — HEPARIN LEVEL (UNFRACTIONATED): HEPARIN UNFRACTIONATED: 0.25 [IU]/mL — AB (ref 0.30–0.70)

## 2017-09-02 LAB — LACTIC ACID, PLASMA: Lactic Acid, Venous: 2.1 mmol/L (ref 0.5–1.9)

## 2017-09-02 MED ORDER — DEXTROSE 5 % IV SOLN
5.0000 mg/h | INTRAVENOUS | Status: DC
  Filled 2017-09-02: qty 100

## 2017-09-02 MED ORDER — MECLIZINE HCL 25 MG PO TABS
25.0000 mg | ORAL_TABLET | Freq: Three times a day (TID) | ORAL | Status: DC | PRN
  Administered 2017-09-02: 25 mg via ORAL
  Filled 2017-09-02 (×2): qty 1

## 2017-09-02 MED ORDER — AMIODARONE HCL IN DEXTROSE 360-4.14 MG/200ML-% IV SOLN
60.0000 mg/h | INTRAVENOUS | Status: AC
  Administered 2017-09-02: 60 mg/h via INTRAVENOUS
  Filled 2017-09-02 (×2): qty 200

## 2017-09-02 MED ORDER — INSULIN ASPART 100 UNIT/ML ~~LOC~~ SOLN
0.0000 [IU] | Freq: Every day | SUBCUTANEOUS | Status: DC
  Administered 2017-09-02 – 2017-09-03 (×2): 3 [IU] via SUBCUTANEOUS
  Administered 2017-09-04: 2 [IU] via SUBCUTANEOUS

## 2017-09-02 MED ORDER — INSULIN ASPART 100 UNIT/ML ~~LOC~~ SOLN
0.0000 [IU] | Freq: Three times a day (TID) | SUBCUTANEOUS | Status: DC
  Administered 2017-09-02 – 2017-09-03 (×3): 8 [IU] via SUBCUTANEOUS
  Administered 2017-09-03: 3 [IU] via SUBCUTANEOUS
  Administered 2017-09-03: 8 [IU] via SUBCUTANEOUS
  Administered 2017-09-04: 2 [IU] via SUBCUTANEOUS
  Administered 2017-09-04 (×2): 3 [IU] via SUBCUTANEOUS
  Administered 2017-09-05: 2 [IU] via SUBCUTANEOUS

## 2017-09-02 MED ORDER — HEPARIN BOLUS VIA INFUSION
4000.0000 [IU] | Freq: Once | INTRAVENOUS | Status: AC
  Administered 2017-09-02: 4000 [IU] via INTRAVENOUS
  Filled 2017-09-02: qty 4000

## 2017-09-02 MED ORDER — DILTIAZEM HCL-DEXTROSE 100-5 MG/100ML-% IV SOLN (PREMIX)
5.0000 mg/h | INTRAVENOUS | Status: DC
  Administered 2017-09-02: 5 mg/h via INTRAVENOUS
  Filled 2017-09-02: qty 100

## 2017-09-02 MED ORDER — VANCOMYCIN HCL 10 G IV SOLR
1500.0000 mg | INTRAVENOUS | Status: DC
  Administered 2017-09-02 – 2017-09-03 (×2): 1500 mg via INTRAVENOUS
  Filled 2017-09-02 (×2): qty 1500

## 2017-09-02 MED ORDER — AMIODARONE HCL IN DEXTROSE 360-4.14 MG/200ML-% IV SOLN
30.0000 mg/h | INTRAVENOUS | Status: DC
  Administered 2017-09-02 – 2017-09-03 (×2): 30 mg/h via INTRAVENOUS
  Filled 2017-09-02: qty 200

## 2017-09-02 MED ORDER — CARVEDILOL 6.25 MG PO TABS: 6.2500 mg | ORAL_TABLET | Freq: Two times a day (BID) | ORAL | Status: DC

## 2017-09-02 MED ORDER — INSULIN ASPART 100 UNIT/ML ~~LOC~~ SOLN
3.0000 [IU] | Freq: Three times a day (TID) | SUBCUTANEOUS | Status: DC
  Administered 2017-09-02 – 2017-09-05 (×9): 3 [IU] via SUBCUTANEOUS

## 2017-09-02 MED ORDER — INSULIN GLARGINE 100 UNIT/ML ~~LOC~~ SOLN
40.0000 [IU] | Freq: Every day | SUBCUTANEOUS | Status: DC
  Administered 2017-09-02 – 2017-09-04 (×3): 40 [IU] via SUBCUTANEOUS
  Filled 2017-09-02 (×5): qty 0.4

## 2017-09-02 MED ORDER — MAGNESIUM SULFATE 4 GM/100ML IV SOLN
4.0000 g | Freq: Once | INTRAVENOUS | Status: AC
  Administered 2017-09-02: 4 g via INTRAVENOUS
  Filled 2017-09-02: qty 100

## 2017-09-02 MED ORDER — AMIODARONE LOAD VIA INFUSION
150.0000 mg | Freq: Once | INTRAVENOUS | Status: AC
  Administered 2017-09-02: 150 mg via INTRAVENOUS
  Filled 2017-09-02: qty 83.34

## 2017-09-02 MED ORDER — HEPARIN (PORCINE) IN NACL 100-0.45 UNIT/ML-% IJ SOLN
1500.0000 [IU]/h | INTRAMUSCULAR | Status: AC
  Administered 2017-09-02 – 2017-09-03 (×2): 1500 [IU]/h via INTRAVENOUS
  Filled 2017-09-02: qty 250

## 2017-09-02 MED ORDER — HEPARIN (PORCINE) IN NACL 100-0.45 UNIT/ML-% IJ SOLN
1300.0000 [IU]/h | INTRAMUSCULAR | Status: DC
  Administered 2017-09-02: 1300 [IU]/h via INTRAVENOUS
  Filled 2017-09-02: qty 250

## 2017-09-02 MED ORDER — LEVOTHYROXINE SODIUM 88 MCG PO TABS
188.0000 ug | ORAL_TABLET | Freq: Every day | ORAL | Status: DC
  Administered 2017-09-03 – 2017-09-05 (×3): 188 ug via ORAL
  Filled 2017-09-02 (×3): qty 1

## 2017-09-02 MED ORDER — METOPROLOL TARTRATE 5 MG/5ML IV SOLN
5.0000 mg | Freq: Once | INTRAVENOUS | Status: AC
  Administered 2017-09-02: 5 mg via INTRAVENOUS
  Filled 2017-09-02: qty 5

## 2017-09-02 NOTE — Progress Notes (Signed)
PROGRESS NOTE    [name]  ENM:[id number] DOB: Apr 04, 1941 DOA: 08/31/2017 PCP: Sharlene Dory, DO    Brief Narrative:  Patient is a 77 year old gentleman history of hypertension, hypothyroidism, dyslipidemia, diabetes, pulmonary fibrosis, COPD, ischemic cardiomyopathy, chronic systolic heart failure, aortic aneurysm, presenting to the ED with fever chills dysuria, increasing shortness of breath at rest and on exertion requiring increased O2 from his baseline of 2 L to 3 L.  Patient pancultured placed empirically on IV vancomycin and IV Zosyn.  CT chest which was done showed nodular opacities within the left lung improved from prior study, chronic biapical scarring, associated diffuse right-sided pleural calcification. On hospital day 2 on 09/02/2017 at 6:45 AM patient was noted to be tachycardic concerns for probable A. fib heart rate sustaining in the 130s.   Assessment & Plan:   Principal Problem:   Sepsis secondary to UTI [facility]) Active Problems:   Tachycardia   SOB (shortness of breath)   Hypothyroidism   Essential hypertension   Coronary atherosclerosis   OSA on CPAP   Hyperlipidemia   Diabetes mellitus with complication, with long-term current use of insulin (HCC)   Leukocytosis   Anxiety and depression   COPD (chronic obstructive pulmonary disease) (HCC)   Ischemic cardiomyopathy   Chronic systolic CHF (congestive heart failure) (HCC)   Aortic aneurysm (HCC)  #1 sepsis felt secondary to UTI Patient presented with fevers, chills, dysuria urinalysis consistent with a UTI.  On admission patient noted to have a fever with temperatures of 101, O2 sats of 90% on 3 L nasal cannula, elevated lactic acid level, with a leukocytosis.  Patient pancultured with results pending.  Repeat lactic acid level.  Continue empiric IV vancomycin and IV Zosyn.  Follow.  2.  Tachycardia/??  New onset A. Fib Patient noted to be tachycardic at 6:45 AM this morning and noted to be going  in and out of A. fib per telemetry telemetry monitor.  Patient with a chronic left bundle branch block.  Heart rate in the 130s.  Patient complaining of dizziness and worsening shortness of breath.  Patient denies any chest pain.  We will cycle cardiac enzymes every 6 hours x3.  Check a TSH.  Check a chest x-ray.  Check a magnesium level.  Check a 2D echo.  Patient given Lopressor 5 mg IV x1.  Patient placed back on home regimen of the beta.  Heart rate still stayed training in the 120s-130s.  Potassium level of 3.7.  Give extra dose of K Dur 40 p.o. x1.  2D echo from 08/28/2016 with decreasing left ventricular systolic function primarily secondary to severe left bundle branch block related dyssynchrony, regional hypokinesis in the distribution of RCA EF of 35-40%. ??  May need to place on amiodarone for rate control.  Will consult with cardiology for further evaluation and management.  3.  COPD Stable.  Continue Spiriva.  Place on Symbicort.  Nebs as needed.  4.  Hypothyroidism Check a TSH.  Continue home dose Synthroid.  5.  Diabetes mellitus II Last hemoglobin A1c was 9.5 ON 10/02/2016.  CBGs this morning was 253.  Continue Lantus.  Sliding scale insulin.  Discontinue semaglutide. follow.  6.  Ischemic cardiomyopathy/chronic systolic heart failure Due to problem #2 we will cycle cardiac enzymes every 6 hours x3, check a TSH, repeat 2D echo.  Patient does not look volume overloaded on examination and likely currently compensated.  Continue Zebeta, Plavix, aspirin, Lipitor, Cozaar, spironolactone.  Consult with cardiology secondary to  problem #2.  7.  UTI Urine cultures pending.  Continue empiric IV Zosyn.  8.  Shortness of breath Likely secondary to problem #2.  CT chest on admission with no significant acute infiltrate, nodular opacities within the left lung have improved from prior study reflecting improving chronic infection.  Mild focal aneurysmal dilatation of the aortic arch to 3.7 cm diameter.   Check a chest x-ray.  Check ABG.  See problem #2.  9.  Hypertension Continue bisoprolol, losartan, Aldactone.  10.  Obstructive sleep apnea CPAP nightly.  11.  Hyperlipidemia Check a fasting lipid panel.  Continue statin.  12.  Depression/anxiety Stable.  Continue Zoloft and Xanax.  13.  History of aortic aneurysm and history of thoracic aortic ductus diverticulum Outpatient follow-up for CTA in June 2019, per cardiologist note from 06/23/2017.     DVT prophylaxis: Lovenox Code Status: Full Family Communication: Updated patient.  No family at bedside. Disposition Plan: To be determined.   Consultants:   None  Procedures:   CT chest 08/31/2017  Chest x-ray 08/31/2017  Antimicrobials:   IV Zosyn 08/31/2017  IV vancomycin 08/31/2017   Subjective: Was called by nursing that patient went into A. fib at 6:45 AM with heart rate sustaining in the 130s.  Came and assessed patient patient was in the bathroom.  Patient walking out of the bathroom seems short of breath.  Patient complaining of dizziness.  Patient denies any chest pain.  Patient states still with some dysuria.  Patient seems to going in and out of atrial fibrillation and tachycardia on telemetry.  Objective: Vitals:   09/01/17 1702 09/01/17 1823 09/01/17 2152 09/02/17 0603  BP: 116/64 (!) 148/69 129/65 (!) 159/68  Pulse:  76 60 68  Resp: (!) 21  18 18   Temp: 98.2 F (36.8 C)  98.2 F (36.8 C) 98.6 F (37 C)  TempSrc: Oral  Oral Oral  SpO2: 95%  98% 92%  Weight:    92.9 kg (204 lb 12.9 oz)  Height:        Intake/Output Summary (Last 24 hours) at 09/02/2017 0754 Last data filed at 09/01/2017 1833 Gross per 24 hour  Intake 2370 ml  Output 200 ml  Net 2170 ml   Filed Weights   08/31/17 1944 09/02/17 0603  Weight: 95.7 kg (211 lb) 92.9 kg (204 lb 12.9 oz)    Examination:  General exam: Appears short of breath. Respiratory system: Clear to auscultation.  No wheezes, no crackles, no rhonchi.  Respiratory  effort normal. Cardiovascular system: Tachycardic.  No JVD, murmurs, rubs, gallops or clicks. No pedal edema. Gastrointestinal system: Abdomen is nondistended, soft and nontender. No organomegaly or masses felt. Normal bowel sounds heard. Central nervous system: Alert and oriented. No focal neurological deficits. Extremities: Symmetric 5 x 5 power. Skin: No rashes, lesions or ulcers Psychiatry: Judgement and insight appear normal. Mood & affect appropriate.     Data Reviewed: I have personally reviewed following labs and imaging studies  CBC: Recent Labs  Lab 08/31/17 1953 08/31/17 2005 09/01/17 1749 09/02/17 0622  WBC 10.8*  --  8.7 7.9  NEUTROABS 8.8*  --   --   --   HGB 11.7* 11.9* 11.5* 11.1*  HCT 35.7* 35.0* 35.2* 34.1*  MCV 84.6  --  83.4 85.0  PLT 129*  --  124* 103*   Basic Metabolic Panel: Recent Labs  Lab 08/31/17 2005 09/01/17 1749 09/02/17 0622  NA 135  --  139  K 4.5  --  3.7  CL 99*  --  103  CO2  --   --  24  GLUCOSE 244*  --  225*  BUN 20  --  23*  CREATININE 1.10 0.90 1.17  CALCIUM  --   --  8.8*   GFR: Estimated Creatinine Clearance: 64.2 mL/min (by C-G formula based on SCr of 1.17 mg/dL). Liver Function Tests: Recent Labs  Lab 08/31/17 2055  AST 25  ALT 24  ALKPHOS 57  BILITOT 0.7  PROT 6.6  ALBUMIN 3.9   No results for input(s): LIPASE, AMYLASE in the last 168 hours. No results for input(s): AMMONIA in the last 168 hours. Coagulation Profile: Recent Labs  Lab 09/01/17 1749  INR 1.07   Cardiac Enzymes: No results for input(s): CKTOTAL, CKMB, CKMBINDEX, TROPONINI in the last 168 hours. BNP (last 3 results) Recent Labs    10/20/16 1038  PROBNP 188   HbA1C: No results for input(s): HGBA1C in the last 72 hours. CBG: Recent Labs  Lab 09/01/17 2151  GLUCAP 253*   Lipid Profile: No results for input(s): CHOL, HDL, LDLCALC, TRIG, CHOLHDL, LDLDIRECT in the last 72 hours. Thyroid Function Tests: No results for input(s): TSH,  T4TOTAL, FREET4, T3FREE, THYROIDAB in the last 72 hours. Anemia Panel: No results for input(s): VITAMINB12, FOLATE, FERRITIN, TIBC, IRON, RETICCTPCT in the last 72 hours. Sepsis Labs: Recent Labs  Lab 08/31/17 2052 08/31/17 2233 09/01/17 1749 09/01/17 2039  PROCALCITON  --   --  <0.10  --   LATICACIDVEN 2.48* 1.01 1.5 2.6*    Recent Results (from the past 240 hour(s))  Blood Culture (routine x 2)     Status: None (Preliminary result)   Collection Time: 08/31/17  8:45 PM  Result Value Ref Range Status   Specimen Description   Final    BLOOD LEFT ANTECUBITAL Performed at [facility], [address] [place], [place] [address]    Special Requests   Final    BOTTLES DRAWN AEROBIC AND ANAEROBIC Blood Culture adequate volume Performed at [facility], [address] [place], [place] [address]    Culture   Final    NO GROWTH < 24 HOURS Performed at [facility], [address], [place], [place] [address]    Report Status PENDING  Incomplete  Blood Culture (routine x 2)     Status: None (Preliminary result)   Collection Time: 08/31/17  9:00 PM  Result Value Ref Range Status   Specimen Description   Final    BLOOD RIGHT ANTECUBITAL Performed at [facility], [address] [place], [place] [address]    Special Requests   Final    BOTTLES DRAWN AEROBIC AND ANAEROBIC Blood Culture adequate volume Performed at [facility], [address] [place], [place] [address]    Culture   Final    NO GROWTH < 24 HOURS Performed at [facility], [address], [place], [place] [address]    Report Status PENDING  Incomplete         Radiology Studies: Dg Chest 2 View  Result Date: 08/31/2017 CLINICAL DATA:  Shortness of breath and chills EXAM: CHEST  2 VIEW COMPARISON:  July 16, 2017 and Dec 25, 2016 FINDINGS: There is scarring throughout portions of the right lung with chronic blunting of the right costophrenic angle.  There is also chronic blunting of the left costophrenic angle with scarring in the left base. There is no edema  or consolidation. The heart size and pulmonary vascularity are within normal limits. No adenopathy. No evident bone lesions. IMPRESSION: Areas of scarring bilaterally, more on the right than on the left. Blunting of the costophrenic angles bilaterally, chronic. No edema or consolidation. Stable cardiac silhouette. Electronically Signed   By: Bretta Bang III M.D.   On: 08/31/2017 20:28   Ct Chest W Contrast  Result Date: 08/31/2017 CLINICAL DATA:  Acute onset of shortness of breath and chills. EXAM: CT CHEST WITH CONTRAST TECHNIQUE: Multidetector CT imaging of the chest was performed during intravenous contrast administration. CONTRAST:  ISOVUE-300 IOPAMIDOL (ISOVUE-300) INJECTION 61% COMPARISON:  Chest radiograph performed earlier today 7:53 p.m., and CT of the chest performed 06/12/2017 FINDINGS: Cardiovascular: The heart is normal in size. Scattered coronary artery calcifications are seen. There is mild focal aneurysmal dilatation of the aortic arch to 3.7 cm in diameter, with mild underlying calcification. The great vessels demonstrate scattered calcification but are otherwise grossly unremarkable. Mediastinum/Nodes: The mediastinum is otherwise unremarkable in appearance. No mediastinal lymphadenopathy is seen. No pericardial effusion is identified. The thyroid gland is not well characterized. No axillary lymphadenopathy is seen. Lungs/Pleura: Chronic scarring is noted at the lung apices, with associated calcification on the right. Additional nodular left-sided airspace opacities have improved from the prior study, reflecting improving chronic infection. Diffuse right-sided pleural calcifications are seen. No pleural effusion or pneumothorax is seen. Upper Abdomen: The visualized portions of the liver and spleen are unremarkable. Stones are seen dependently within the gallbladder. The  visualized portions of the pancreas, adrenal glands and kidneys are within normal limits. Musculoskeletal: No acute osseous abnormalities are identified. The visualized musculature is unremarkable in appearance. IMPRESSION: 1. Nodular opacities within the left lung have improved from the prior study, reflecting improving chronic infection. 2. Chronic biapical scarring, with associated diffuse right-sided pleural calcification. 3. 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 4. Cholelithiasis.  Gallbladder otherwise unremarkable. Electronically Signed   By: Roanna Raider M.D.   On: 08/31/2017 22:12        Scheduled Meds: . ALPRAZolam  0.5 mg Oral BID  . aspirin EC  81 mg Oral Daily  . atorvastatin  40 mg Oral Daily  . bisoprolol  5 mg Oral Daily  . cholecalciferol  2,000 Units Oral Daily  . clopidogrel  75 mg Oral Q breakfast  . enoxaparin (LOVENOX) injection  40 mg Subcutaneous Q24H  . insulin aspart  0-9 Units Subcutaneous TID WC  . insulin glargine  30 Units Subcutaneous QHS  . levothyroxine  200 mcg Oral QAC breakfast  . losartan  50 mg Oral Daily  . pneumococcal 23 valent vaccine  0.5 mL Intramuscular Tomorrow-1000  . predniSONE  10 mg Oral Q breakfast  . [START ON 09/04/2017] Semaglutide  0.25 mg Subcutaneous Q7 days  . sertraline  50 mg Oral Daily  . spironolactone  12.5 mg Oral Daily  . tamsulosin  0.8 mg Oral QHS  . tiotropium  18 mcg Inhalation Daily   Continuous Infusions: . sodium chloride 75 mL/hr at 09/02/17 0752  . piperacillin-tazobactam (ZOSYN)  IV 3.375 g (09/02/17 0410)  . vancomycin 1,250 mg (09/01/17 2334)     LOS: 1 day    Time spent: 45 minutes    Ramiro Harvest, MD [facility] Pager [phone]  If 7PM-7AM, please contact night-coverage [email or web address] Password [facility] 09/02/2017, 7:54 AM

## 2017-09-02 NOTE — Consult Note (Signed)
CARDIOLOGY CONSULT NOTE       Patient ID: [name] MRN: [id number] DOB/AGE: [birth year] 77 y.o.  Admit date: 08/31/2017 Referring Physician: Janee Morn Primary Physician: Sharlene Dory, DO Primary Cardiologist: Jens Som Reason for Consultation: New onset afib   Principal Problem:   Sepsis secondary to UTI [facility]) Active Problems:   Hypothyroidism   Essential hypertension   Coronary atherosclerosis   OSA on CPAP   Hyperlipidemia   Diabetes mellitus with complication, with long-term current use of insulin (HCC)   Leukocytosis   Anxiety and depression   COPD (chronic obstructive pulmonary disease) (HCC)   Tachycardia   SOB (shortness of breath)   Ischemic cardiomyopathy   Chronic systolic CHF (congestive heart failure) (HCC)   Aortic aneurysm (HCC)   HPI:  77 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 Currently no complaints except palpitations and rate still 120  No contraindications to anticoagulation   This patients CHA2DS2-VASc Score and unadjusted Ischemic Stroke Rate (% per year) is equal to 4.8 % stroke rate/year from a score of 4  Above score calculated as 1 point each if present [CHF, HTN, DM, Vascular=MI/PAD/Aortic Plaque, Age if 65-74, or Male] Above score calculated as 2 points each if present [Age > 75, or Stroke/TIA/TE]   ROS All other systems reviewed and negative except as noted above  Past Medical History:  Diagnosis Date  . Anxiety   . Aortic aneurysm (HCC) 09/02/2017  . BENIGN PROSTATIC HYPERTROPHY 11/23/2009  . Cardiomyopathy (HCC) 08/28/2016  . Chronic systolic CHF (congestive heart  failure) (HCC) 09/02/2017  . COPD (chronic obstructive pulmonary disease) (HCC)   . CORONARY ARTERY DISEASE 11/23/2009  . 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 DEGENERATIVE JOINT DISEASE 11/23/2009  . DEPRESSION 11/23/2009  . FATIGUE 11/23/2009  . GAIT DISTURBANCE 12/10/2009  . HEMOPTYSIS UNSPECIFIED 05/07/2010  . High cholesterol   . HYPERTENSION 07/30/2009  . HYPOTHYROIDISM 07/30/2009  . Ischemic cardiomyopathy 09/02/2017  . 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 On home oxygen therapy    "2-3L; 24/7" (09/10/2016)  . OSA on CPAP   . PTSD (post-traumatic stress disorder) 03/10/2012  . 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 RA (rheumatoid arthritis) (HCC) 06/11/2011   "qwhere" (09/10/2016)  . RESPIRATORY FAILURE, CHRONIC 07/31/2009  . Scleritis of both eyes 03/17/2014  . 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 Type II diabetes mellitus (HCC)     Family History  Problem Relation Age of Onset  . Other Mother        gun shot    Social History   Socioeconomic History  . Marital status: Married    Spouse name: Not on file  . Number of children: Not on file  . Years of education: Not on file  . Highest education level: Not on file  Social Needs  . Financial resource strain: Not on file  . Food insecurity - worry: Not on file  . Food insecurity - inability: Not on file  . Transportation needs - medical: Not on file  . Transportation needs - non-medical: Not on file  Occupational History  . Occupation: disabled Cytogeneticist, Advertising account executive  Employer: RETIRED  Tobacco Use  . Smoking status: Former Smoker    Packs/day: 2.50    Years: 40.00    Pack years: 100.00    Types: Cigarettes, Pipe, Cigars    Last attempt to quit: 07/28/1998    Years since quitting: 19.1  . Smokeless tobacco: Never Used  Substance and Sexual Activity  . Alcohol use: No    Alcohol/week: 0.0 oz  . Drug use: No  . Sexual activity: Not on file  Other Topics Concern  . Not on file  Social History Narrative   Lives in the home with his  wife   Sees [place] every 6 month for meds.   Denies asbestos exposure    Past Surgical History:  Procedure Laterality Date  . 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 ABDOMINAL EXPLORATION SURGERY  02/2004   w/LOA/notes 12/10/2010; small bowel obstruction repair with adhesiolysis   . BACK SURGERY    . CARDIAC CATHETERIZATION     2 heart caths in the past.  One in 2000s showed one ulcerated plaque  Rx medically; Second at [name] 09/05/2016  . CATARACT EXTRACTION W/ INTRAOCULAR LENS  IMPLANT, BILATERAL Bilateral 2000s  . COLECTOMY     hx of remote ileum resection due to bleeding  . CORONARY ANGIOPLASTY WITH STENT PLACEMENT  09/10/2016  . CORONARY STENT INTERVENTION N/A 09/10/2016   Procedure: Coronary Stent Intervention;  Surgeon: Kathleene Hazel, MD;  Location: [facility] INVASIVE CV LAB;  Service: Cardiovascular;  Laterality: N/A;  Distal RCA 4.0x16 Synergy  . FEMORAL EMBOLOECTOMY Left 07/2000   with left leg ischemia; Dr. Hart Rochester, vascular  . GANGLION CYST EXCISION Right    "wrist"; Dr. Teressa Senter  . LUMBAR LAMINECTOMY  1972   Dr. Fannie Knee  . RIGHT/LEFT HEART CATH AND CORONARY ANGIOGRAPHY N/A 09/10/2016   Procedure: Right/Left Heart Cath and Coronary Angiography;  Surgeon: Kathleene Hazel, MD;  Location: [facility] INVASIVE CV LAB;  Service: Cardiovascular;  Laterality: N/A;  . TONSILLECTOMY       . ALPRAZolam  0.5 mg Oral BID  . amiodarone  150 mg Intravenous Once  . aspirin EC  81 mg Oral Daily  . atorvastatin  40 mg Oral Daily  . carvedilol  6.25 mg Oral BID WC  . cholecalciferol  2,000 Units Oral Daily  . insulin aspart  0-15 Units Subcutaneous TID WC  . insulin aspart  0-5 Units Subcutaneous QHS  . insulin aspart  3 Units Subcutaneous TID WC  . insulin glargine  30 Units Subcutaneous QHS  . levothyroxine  200 mcg Oral QAC breakfast  . losartan  50 mg Oral Daily  . pneumococcal 23 valent vaccine  0.5 mL Intramuscular Tomorrow-1000  . predniSONE  10 mg Oral Q  breakfast  . [START ON 09/04/2017] Semaglutide  0.25 mg Subcutaneous Q7 days  . sertraline  50 mg Oral Daily  . spironolactone  12.5 mg Oral Daily  . tamsulosin  0.8 mg Oral QHS  . tiotropium  18 mcg Inhalation Daily   . sodium chloride 75 mL/hr at 09/02/17 0752  . amiodarone     Followed by  . amiodarone    . diltiazem (CARDIZEM) infusion    . magnesium sulfate 1 - 4 g bolus IVPB 4 g (09/02/17 0858)  . piperacillin-tazobactam (ZOSYN)  IV Stopped (09/02/17 9163)  . vancomycin      Physical Exam: Blood pressure (!) 159/68, pulse 68, temperature 98.6 F (37 C), temperature source Oral, resp. rate  18, height 6\' 3"  (1.905 m), weight 204 lb 12.9 oz (92.9 kg), SpO2 92 %.   Affect appropriate Elderly white male  HEENT: normal Neck supple with no adenopathy JVP normal no bruits no thyromegaly Lungs clear with no wheezing and good diaphragmatic motion Heart:  S1/S2 no murmur, no rub, gallop or click PMI normal Abdomen: benighn, BS positve, no tenderness, no AAA no bruit.  No HSM or HJR Distal pulses intact with no bruits No edema RUE tremor  Skin warm and dry No muscular weakness   Labs:   Lab Results  Component Value Date   WBC 7.9 09/02/2017   HGB 11.1 (L) 09/02/2017   HCT 34.1 (L) 09/02/2017   MCV 85.0 09/02/2017   PLT 103 (L) 09/02/2017    Recent Labs  Lab 08/31/17 2055  09/02/17 0622  NA  --   --  139  K  --   --  3.7  CL  --   --  103  CO2  --   --  24  BUN  --   --  23*  CREATININE  --    < > 1.17  CALCIUM  --   --  8.8*  PROT 6.6  --   --   BILITOT 0.7  --   --   ALKPHOS 57  --   --   ALT 24  --   --   AST 25  --   --   GLUCOSE  --   --  225*   < > = values in this interval not displayed.   Lab Results  Component Value Date   CKTOTAL 280 (H) 02/17/2011   CKMB 17.4 (HH) 02/17/2011   TROPONINI <0.30 07/19/2013    Lab Results  Component Value Date   CHOL 123 12/23/2016   CHOL 159 10/20/2016   CHOL 207 (H) 10/02/2016   Lab Results  Component  Value Date   HDL 37 (L) 12/23/2016   HDL 30 (L) 10/20/2016   HDL 38.50 (L) 10/02/2016   Lab Results  Component Value Date   LDLCALC 65 12/23/2016   LDLCALC 87 10/20/2016   LDLCALC 80 04/04/2016   Lab Results  Component Value Date   TRIG 104 12/23/2016   TRIG 209 (H) 10/20/2016   TRIG 218.0 (H) 10/02/2016   Lab Results  Component Value Date   CHOLHDL 3.3 12/23/2016   CHOLHDL 5.3 (H) 10/20/2016   CHOLHDL 5 10/02/2016   Lab Results  Component Value Date   LDLDIRECT 106.0 10/02/2016   LDLDIRECT 66.0 10/02/2015   LDLDIRECT 69.0 09/20/2014      Radiology: Dg Chest 2 View  Result Date: 08/31/2017 CLINICAL DATA:  Shortness of breath and chills EXAM: CHEST  2 VIEW COMPARISON:  July 16, 2017 and Dec 25, 2016 FINDINGS: There is scarring throughout portions of the right lung with chronic blunting of the right costophrenic angle. There is also chronic blunting of the left costophrenic angle with scarring in the left base. There is no edema or consolidation. The heart size and pulmonary vascularity are within normal limits. No adenopathy. No evident bone lesions. IMPRESSION: Areas of scarring bilaterally, more on the right than on the left. Blunting of the costophrenic angles bilaterally, chronic. No edema or consolidation. Stable cardiac silhouette. Electronically Signed   By: December 27, 2016 III M.D.   On: 08/31/2017 20:28   Ct Chest W Contrast  Result Date: 08/31/2017 CLINICAL DATA:  Acute onset of shortness of breath and chills. EXAM: CT  CHEST WITH CONTRAST TECHNIQUE: Multidetector CT imaging of the chest was performed during intravenous contrast administration. CONTRAST:  ISOVUE-300 IOPAMIDOL (ISOVUE-300) INJECTION 61% COMPARISON:  Chest radiograph performed earlier today 7:53 p.m., and CT of the chest performed 06/12/2017 FINDINGS: Cardiovascular: The heart is normal in size. Scattered coronary artery calcifications are seen. There is mild focal aneurysmal dilatation of the  aortic arch to 3.7 cm in diameter, with mild underlying calcification. The great vessels demonstrate scattered calcification but are otherwise grossly unremarkable. Mediastinum/Nodes: The mediastinum is otherwise unremarkable in appearance. No mediastinal lymphadenopathy is seen. No pericardial effusion is identified. The thyroid gland is not well characterized. No axillary lymphadenopathy is seen. Lungs/Pleura: Chronic scarring is noted at the lung apices, with associated calcification on the right. Additional nodular left-sided airspace opacities have improved from the prior study, reflecting improving chronic infection. Diffuse right-sided pleural calcifications are seen. No pleural effusion or pneumothorax is seen. Upper Abdomen: The visualized portions of the liver and spleen are unremarkable. Stones are seen dependently within the gallbladder. The visualized portions of the pancreas, adrenal glands and kidneys are within normal limits. Musculoskeletal: No acute osseous abnormalities are identified. The visualized musculature is unremarkable in appearance. IMPRESSION: 1. Nodular opacities within the left lung have improved from the prior study, reflecting improving chronic infection. 2. Chronic biapical scarring, with associated diffuse right-sided pleural calcification. 3. 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 4. Cholelithiasis.  Gallbladder otherwise unremarkable. Electronically Signed   By: Roanna Raider M.D.   On: 08/31/2017 22:12    EKG: rapid afib LBBB   ASSESSMENT AND PLAN:  PaF:  In setting of urosepsis. Given CAD and low EF will try to convert in first 48 hours with amiodarone / cardizem Discussed [facility] late tomorrow with him if he does not convert and he is willing to do that as well. Change bisoprolol to coreg bid and start heparin per pharmacy protocol   DCM:  Continue ARB prefer to get him out of afib sooner to  prevent CHF exacerbation  Urosepsis:  On zosyn per primary service does not look toxic  CAD:  Stable is now just at one year post DES to RCA and stop DAT especially since anticoagulation now needed  Signed: Charlton Haws 09/02/2017, 9:28 AM

## 2017-09-02 NOTE — Progress Notes (Signed)
0645: pt in Afib per tele.  EKG obtained and showed Afib with RVR. MD paged and new order placed for Metoprolol 5mg  IV x1.

## 2017-09-02 NOTE — Progress Notes (Signed)
Pharmacy Antibiotic Note  [name] is a 77 y.o. male admitted on 08/31/2017 with sepsis.  Pharmacy has been consulted for vancomycin and Zosyn dosing.   Plan: Day 2 full Abxs 1) Change vancomycin from 1250mg  IV q12 to 1500mg  IV q24 - goal AUC 400-500 2) Continue current Zosyn dosing    Height: 6\' 3"  (190.5 cm) Weight: 204 lb 12.9 oz (92.9 kg) IBW/kg (Calculated) : 84.5  Temp (24hrs), Avg:98.3 F (36.8 C), Min:98.2 F (36.8 C), Max:98.6 F (37 C)  Recent Labs  Lab 08/31/17 1953 08/31/17 2005 08/31/17 2052 08/31/17 2233 09/01/17 1749 09/01/17 2039 09/02/17 0622  WBC 10.8*  --   --   --  8.7  --  7.9  CREATININE  --  1.10  --   --  0.90  --  1.17  LATICACIDVEN  --   --  2.48* 1.01 1.5 2.6*  --     Estimated Creatinine Clearance: 64.2 mL/min (by C-G formula based on SCr of 1.17 mg/dL).    No Known Allergies   Thank you for allowing pharmacy to be a part of this patient's care.  10/30/17, PharmD, BCPS Pager [phone] 09/02/2017 8:11 AM

## 2017-09-02 NOTE — Progress Notes (Signed)
ANTICOAGULATION CONSULT NOTE - Initial Consult  Pharmacy Consult for IV heparin Indication: atrial fibrillation  No Known Allergies  Patient Measurements: Height: 6\' 3"  (190.5 cm) Weight: 204 lb 12.9 oz (92.9 kg) IBW/kg (Calculated) : 84.5 Heparin Dosing Weight: 92.9 kg  Vital Signs: Temp: 98.6 F (37 C) (02/06 0603) Temp Source: Oral (02/06 0603) BP: 159/68 (02/06 0603) Pulse Rate: 68 (02/06 0603)  Labs: Recent Labs    08/31/17 1953 08/31/17 2005 09/01/17 1749 09/02/17 0622 09/02/17 0809  HGB 11.7* 11.9* 11.5* 11.1*  --   HCT 35.7* 35.0* 35.2* 34.1*  --   PLT 129*  --  124* 103*  --   APTT  --   --  30  --   --   LABPROT  --   --  13.8  --   --   INR  --   --  1.07  --   --   CREATININE  --  1.10 0.90 1.17  --   TROPONINI  --   --   --   --  0.04*    Estimated Creatinine Clearance: 64.2 mL/min (by C-G formula based on SCr of 1.17 mg/dL).   Medical History: Past Medical History:  Diagnosis Date  . Anxiety   . Aortic aneurysm (HCC) 09/02/2017  . BENIGN PROSTATIC HYPERTROPHY 11/23/2009  . Cardiomyopathy (HCC) 08/28/2016  . Chronic systolic CHF (congestive heart failure) (HCC) 09/02/2017  . COPD (chronic obstructive pulmonary disease) (HCC)   . CORONARY ARTERY DISEASE 11/23/2009  . 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 DEGENERATIVE JOINT DISEASE 11/23/2009  . DEPRESSION 11/23/2009  . FATIGUE 11/23/2009  . GAIT DISTURBANCE 12/10/2009  . HEMOPTYSIS UNSPECIFIED 05/07/2010  . High cholesterol   . HYPERTENSION 07/30/2009  . HYPOTHYROIDISM 07/30/2009  . Ischemic cardiomyopathy 09/02/2017  . 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 On home oxygen therapy    "2-3L; 24/7" (09/10/2016)  . OSA on CPAP   . PTSD (post-traumatic stress disorder) 03/10/2012  . 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 RA (rheumatoid arthritis) (HCC) 06/11/2011   "qwhere" (09/10/2016)  . RESPIRATORY FAILURE, CHRONIC 07/31/2009  . Scleritis of both eyes 03/17/2014  . 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 Type II diabetes  mellitus (HCC)     Medications:  Scheduled:  . ALPRAZolam  0.5 mg Oral BID  . amiodarone  150 mg Intravenous Once  . aspirin EC  81 mg Oral Daily  . atorvastatin  40 mg Oral Daily  . carvedilol  6.25 mg Oral BID WC  . cholecalciferol  2,000 Units Oral Daily  . insulin aspart  0-15 Units Subcutaneous TID WC  . insulin aspart  0-5 Units Subcutaneous QHS  . insulin aspart  3 Units Subcutaneous TID WC  . insulin glargine  30 Units Subcutaneous QHS  . levothyroxine  200 mcg Oral QAC breakfast  . losartan  50 mg Oral Daily  . pneumococcal 23 valent vaccine  0.5 mL Intramuscular Tomorrow-1000  . predniSONE  10 mg Oral Q breakfast  . [START ON 09/04/2017] Semaglutide  0.25 mg Subcutaneous Q7 days  . sertraline  50 mg Oral Daily  . spironolactone  12.5 mg Oral Daily  . tamsulosin  0.8 mg Oral QHS  . tiotropium  18 mcg Inhalation Daily   Infusions:  . sodium chloride 75 mL/hr at 09/02/17 0752  . amiodarone     Followed by  . amiodarone    . diltiazem (CARDIZEM) infusion    . magnesium sulfate 1 - 4 g bolus IVPB 4 g (09/02/17  1610)  . piperacillin-tazobactam (ZOSYN)  IV Stopped (09/02/17 9604)  . vancomycin      Assessment: 77 yo male with urosepsis picture now with new onset Afib. Per cards, to try and cardiovert with amiodarone and diltiazem in next 48hr with possible [facility] late tomorrow and also start IV heparin per Rx. Baseline CBC drawn - low plt count at 103k, monitor closely  Goal of Therapy:  Heparin level 0.3-0.7 Monitor platelets by anticoagulation protocol: Yes   Plan:  1) IV heparin 4000 unit bolus then 2) IV heparin 1300 units/hr 3) Check heparin level 8 hours after start of IV heparin 4) Daily heparin level and CBC   Hessie Knows, PharmD, BCPS Pager [phone] 09/02/2017 9:54 AM

## 2017-09-02 NOTE — Progress Notes (Signed)
CRITICAL VALUE ALERT  Critical Value:  Lactic acid 2.1   Date & Time Notied:  2/6 0905  Provider Notified: Dr. Janee Morn on floor  Orders Received/Actions taken: NA

## 2017-09-02 NOTE — Progress Notes (Signed)
  Echocardiogram 2D Echocardiogram has been performed.  [name] 09/02/2017, 3:49 PM

## 2017-09-02 NOTE — Care Management Note (Signed)
Case Management Note  Patient Details  Name: [name] MRN: [id number] Date of Birth: August 16, 1940  Subjective/Objective:                  Poss sepsis  Action/Plan: Date: September 02, 2017 Marcelle Smiling, BSN, [place], [place] [phone] Chart and notes review for patient progress and needs. Will follow for case management and discharge needs. No cm or discharge needs present at time of this review. Next review date: [id number] Expected Discharge Date:                  Expected Discharge Plan:  Home/Self Care  In-House Referral:     Discharge planning Services  CM Consult  Post Acute Care Choice:    Choice offered to:     DME Arranged:    DME Agency:     HH Arranged:    HH Agency:     Status of Service:  In process, will continue to follow  If discussed at Long Length of Stay Meetings, dates discussed:    Additional Comments:  Golda Acre, RN 09/02/2017, 8:44 AM

## 2017-09-02 NOTE — Progress Notes (Signed)
CRITICAL VALUE ALERT  Critical Value:  Troponin 0.04  Date & Time Notied:  2/6 0945  Provider Notified: MD on floor  Orders Received/Actions taken: NA

## 2017-09-02 NOTE — Progress Notes (Addendum)
ANTICOAGULATION CONSULT NOTE - Follow Consult  Pharmacy Consult for IV heparin Indication: atrial fibrillation  No Known Allergies  Patient Measurements: Height: 6\' 3"  (190.5 cm) Weight: 204 lb 12.9 oz (92.9 kg) IBW/kg (Calculated) : 84.5 Heparin Dosing Weight: 92.9 kg  Vital Signs: Temp: 97.8 F (36.6 C) (02/06 1924) Temp Source: Oral (02/06 1924) BP: 140/55 (02/06 1800) Pulse Rate: 54 (02/06 1800)  Labs: Recent Labs    08/31/17 1953 08/31/17 2005 09/01/17 1749 09/02/17 0622 09/02/17 0809 09/02/17 1343 09/02/17 1907  HGB 11.7* 11.9* 11.5* 11.1*  --   --   --   HCT 35.7* 35.0* 35.2* 34.1*  --   --   --   PLT 129*  --  124* 103*  --   --   --   APTT  --   --  30  --   --   --   --   LABPROT  --   --  13.8  --   --   --   --   INR  --   --  1.07  --   --   --   --   HEPARINUNFRC  --   --   --   --   --   --  0.25*  CREATININE  --  1.10 0.90 1.17  --   --   --   TROPONINI  --   --   --   --  0.04* 0.17* 0.26*    Estimated Creatinine Clearance: 64.2 mL/min (by C-G formula based on SCr of 1.17 mg/dL).   Medical History: Past Medical History:  Diagnosis Date  . Anxiety   . Aortic aneurysm (HCC) 09/02/2017  . BENIGN PROSTATIC HYPERTROPHY 11/23/2009  . Cardiomyopathy (HCC) 08/28/2016  . Chronic systolic CHF (congestive heart failure) (HCC) 09/02/2017  . COPD (chronic obstructive pulmonary disease) (HCC)   . CORONARY ARTERY DISEASE 11/23/2009  . 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 DEGENERATIVE JOINT DISEASE 11/23/2009  . DEPRESSION 11/23/2009  . FATIGUE 11/23/2009  . GAIT DISTURBANCE 12/10/2009  . HEMOPTYSIS UNSPECIFIED 05/07/2010  . High cholesterol   . HYPERTENSION 07/30/2009  . HYPOTHYROIDISM 07/30/2009  . Ischemic cardiomyopathy 09/02/2017  . 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 On home oxygen therapy    "2-3L; 24/7" (09/10/2016)  . OSA on CPAP   . PTSD (post-traumatic stress disorder) 03/10/2012  . 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 RA (rheumatoid arthritis) (HCC)  06/11/2011   "qwhere" (09/10/2016)  . RESPIRATORY FAILURE, CHRONIC 07/31/2009  . Scleritis of both eyes 03/17/2014  . 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 Type II diabetes mellitus (HCC)     Medications:  Scheduled:  . ALPRAZolam  0.5 mg Oral BID  . aspirin EC  81 mg Oral Daily  . atorvastatin  40 mg Oral Daily  . carvedilol  6.25 mg Oral BID WC  . cholecalciferol  2,000 Units Oral Daily  . insulin aspart  0-15 Units Subcutaneous TID WC  . insulin aspart  0-5 Units Subcutaneous QHS  . insulin aspart  3 Units Subcutaneous TID WC  . insulin glargine  40 Units Subcutaneous QHS  . [START ON 09/03/2017] levothyroxine  188 mcg Oral QAC breakfast  . losartan  50 mg Oral Daily  . pneumococcal 23 valent vaccine  0.5 mL Intramuscular Tomorrow-1000  . predniSONE  10 mg Oral Q breakfast  . sertraline  50 mg Oral Daily  . spironolactone  12.5 mg Oral Daily  . tamsulosin  0.8 mg Oral QHS  .  tiotropium  18 mcg Inhalation Daily   Infusions:  . sodium chloride 75 mL/hr at 09/02/17 1358  . amiodarone 30 mg/hr (09/02/17 1500)  . diltiazem (CARDIZEM) infusion 5 mg/hr (09/02/17 1110)  . heparin 1,300 Units/hr (09/02/17 1111)  . piperacillin-tazobactam (ZOSYN)  IV Stopped (09/02/17 1753)  . vancomycin      Assessment: 77 yo male with urosepsis picture now with new onset Afib. Per cards, to try and cardiovert with amiodarone and diltiazem in next 48hr with possible [facility] late tomorrow and also start IV heparin per Rx. Baseline CBC drawn - low plt count at 103k, monitor closely  Today, 09/02/17  HL is 0.25, subtherapeutic  No bleeding or line issues per RN   Goal of Therapy:  Heparin level 0.3-0.7 Monitor platelets by anticoagulation protocol: Yes   Plan:   Increase IV heparin to 1500 units/hr  Heparin level 8 hours after rate change  CBC ordered with AM labs  Monitor for signs and symptoms of bleeding     Adalberto Cole, PharmD, BCPS Pager [phone] 09/02/2017 9:31 PM

## 2017-09-02 NOTE — Progress Notes (Signed)
Report given to ICU Nurse.Pt transferred to ICU room 1240 via wheelchair. Pt denies pain at the time of transfer with no s/s of distress noted. Pt aware of why transfer was needed and the next steps in treatment were explained to best of RNs knowledge. Pts questions answered at this time.

## 2017-09-03 ENCOUNTER — Encounter (HOSPITAL_COMMUNITY)
Admission: EM | Disposition: A | Discharge status: Home / Self Care | Payer: Self-pay | Source: Home / Self Care | Attending: Internal Medicine

## 2017-09-03 DIAGNOSIS — I480 Paroxysmal atrial fibrillation: Secondary | ICD-10-CM

## 2017-09-03 DIAGNOSIS — N3000 Acute cystitis without hematuria: Secondary | ICD-10-CM

## 2017-09-03 DIAGNOSIS — D72829 Elevated white blood cell count, unspecified: Secondary | ICD-10-CM

## 2017-09-03 DIAGNOSIS — I4891 Unspecified atrial fibrillation: Secondary | ICD-10-CM

## 2017-09-03 DIAGNOSIS — J449 Chronic obstructive pulmonary disease, unspecified: Secondary | ICD-10-CM

## 2017-09-03 LAB — CBC WITH DIFFERENTIAL/PLATELET
Basophils Absolute: 0 10*3/uL (ref 0.0–0.1)
Basophils Relative: 0 %
Eosinophils Absolute: 0.2 10*3/uL (ref 0.0–0.7)
Eosinophils Relative: 3 %
HEMATOCRIT: 32.5 % — AB (ref 39.0–52.0)
Hemoglobin: 10.3 g/dL — ABNORMAL LOW (ref 13.0–17.0)
LYMPHS PCT: 28 %
Lymphs Abs: 1.9 10*3/uL (ref 0.7–4.0)
MCH: 27.1 pg (ref 26.0–34.0)
MCHC: 31.7 g/dL (ref 30.0–36.0)
MCV: 85.5 fL (ref 78.0–100.0)
MONO ABS: 0.9 10*3/uL (ref 0.1–1.0)
MONOS PCT: 14 %
Neutro Abs: 3.8 10*3/uL (ref 1.7–7.7)
Neutrophils Relative %: 55 %
Platelets: 84 10*3/uL — ABNORMAL LOW (ref 150–400)
RBC: 3.8 MIL/uL — ABNORMAL LOW (ref 4.22–5.81)
RDW: 14.9 % (ref 11.5–15.5)
WBC: 6.8 10*3/uL (ref 4.0–10.5)

## 2017-09-03 LAB — BASIC METABOLIC PANEL
Anion gap: 6 (ref 5–15)
BUN: 23 mg/dL — AB (ref 6–20)
CALCIUM: 8.1 mg/dL — AB (ref 8.9–10.3)
CO2: 25 mmol/L (ref 22–32)
Chloride: 110 mmol/L (ref 101–111)
Creatinine, Ser: 1.12 mg/dL (ref 0.61–1.24)
GFR calc Af Amer: >60 mL/min (ref >60)
GLUCOSE: 165 mg/dL — AB (ref 65–99)
Potassium: 3.6 mmol/L (ref 3.5–5.1)
Sodium: 141 mmol/L (ref 135–145)

## 2017-09-03 LAB — GLUCOSE, CAPILLARY
GLUCOSE-CAPILLARY: 265 mg/dL — AB (ref 65–99)
GLUCOSE-CAPILLARY: 289 mg/dL — AB (ref 65–99)
Glucose-Capillary: 161 mg/dL — ABNORMAL HIGH (ref 65–99)
Glucose-Capillary: 257 mg/dL — ABNORMAL HIGH (ref 65–99)

## 2017-09-03 LAB — HEMOGLOBIN A1C
Hgb A1c MFr Bld: 9.3 % — ABNORMAL HIGH (ref 4.8–5.6)
Mean Plasma Glucose: 220.21 mg/dL

## 2017-09-03 LAB — LIPID PANEL
CHOL/HDL RATIO: 3.5 ratio
CHOLESTEROL: 108 mg/dL (ref 0–200)
HDL: 31 mg/dL — AB (ref >40)
LDL Cholesterol: 61 mg/dL (ref 0–99)
TRIGLYCERIDES: 79 mg/dL (ref <150)
VLDL: 16 mg/dL (ref 0–40)

## 2017-09-03 LAB — URINE CULTURE: Culture: 50000 — AB

## 2017-09-03 LAB — HEPARIN LEVEL (UNFRACTIONATED): Heparin Unfractionated: 0.45 IU/mL (ref 0.30–0.70)

## 2017-09-03 LAB — MAGNESIUM: MAGNESIUM: 2.1 mg/dL (ref 1.7–2.4)

## 2017-09-03 SURGERY — CARDIOVERSION
Anesthesia: General

## 2017-09-03 MED ORDER — SODIUM CHLORIDE 0.9 % IV SOLN
1.0000 g | Freq: Four times a day (QID) | INTRAVENOUS | Status: DC
  Administered 2017-09-03 – 2017-09-04 (×3): 1 g via INTRAVENOUS
  Filled 2017-09-03 (×4): qty 1000

## 2017-09-03 MED ORDER — APIXABAN 5 MG PO TABS
5.0000 mg | ORAL_TABLET | Freq: Two times a day (BID) | ORAL | Status: DC
  Administered 2017-09-03 – 2017-09-05 (×5): 5 mg via ORAL
  Filled 2017-09-03 (×5): qty 1

## 2017-09-03 MED ORDER — PROPOFOL 1000 MG/100ML IV EMUL
INTRAVENOUS | Status: AC
  Filled 2017-09-03: qty 100

## 2017-09-03 MED ORDER — CARVEDILOL 6.25 MG PO TABS
6.2500 mg | ORAL_TABLET | Freq: Two times a day (BID) | ORAL | Status: DC
  Administered 2017-09-03 – 2017-09-05 (×4): 6.25 mg via ORAL
  Filled 2017-09-03 (×4): qty 1

## 2017-09-03 MED ORDER — AMIODARONE HCL 200 MG PO TABS
200.0000 mg | ORAL_TABLET | Freq: Three times a day (TID) | ORAL | Status: DC
  Administered 2017-09-03 – 2017-09-05 (×7): 200 mg via ORAL
  Filled 2017-09-03 (×7): qty 1

## 2017-09-03 MED ORDER — POTASSIUM CHLORIDE CRYS ER 20 MEQ PO TBCR
40.0000 meq | EXTENDED_RELEASE_TABLET | Freq: Once | ORAL | Status: AC
  Administered 2017-09-03: 40 meq via ORAL
  Filled 2017-09-03: qty 2

## 2017-09-03 MED ORDER — HYDRALAZINE HCL 20 MG/ML IJ SOLN
10.0000 mg | Freq: Once | INTRAMUSCULAR | Status: AC
  Administered 2017-09-04: 10 mg via INTRAVENOUS
  Filled 2017-09-03: qty 1

## 2017-09-03 MED ORDER — CARVEDILOL 3.125 MG PO TABS: 3.1250 mg | ORAL_TABLET | Freq: Two times a day (BID) | ORAL | Status: DC

## 2017-09-03 NOTE — Progress Notes (Signed)
Dr. Shirlee Latch c cardiology notified of pt bradycardia with amio gtt.

## 2017-09-03 NOTE — Progress Notes (Signed)
ANTICOAGULATION CONSULT NOTE - Follow Consult  Pharmacy Consult for IV heparin Indication: atrial fibrillation  No Known Allergies  Patient Measurements: Height: 6\' 3"  (190.5 cm) Weight: 212 lb 15.4 oz (96.6 kg) IBW/kg (Calculated) : 84.5 Heparin Dosing Weight: 92.9 kg  Vital Signs: Temp: 97.6 F (36.4 C) (02/07 0313) Temp Source: Oral (02/07 0313) BP: 151/89 (02/07 0000) Pulse Rate: 58 (02/07 0000)  Labs: Recent Labs    09/01/17 1749 09/02/17 0622 09/02/17 0809 09/02/17 1343 09/02/17 1907 09/03/17 0259  HGB 11.5* 11.1*  --   --   --  10.3*  HCT 35.2* 34.1*  --   --   --  32.5*  PLT 124* 103*  --   --   --  84*  APTT 30  --   --   --   --   --   LABPROT 13.8  --   --   --   --   --   INR 1.07  --   --   --   --   --   HEPARINUNFRC  --   --   --   --  0.25* 0.45  CREATININE 0.90 1.17  --   --   --  1.12  TROPONINI  --   --  0.04* 0.17* 0.26*  --     Estimated Creatinine Clearance: 67.1 mL/min (by C-G formula based on SCr of 1.12 mg/dL).   Medical History: Past Medical History:  Diagnosis Date  . Anxiety   . Aortic aneurysm (HCC) 09/02/2017  . BENIGN PROSTATIC HYPERTROPHY 11/23/2009  . Cardiomyopathy (HCC) 08/28/2016  . Chronic systolic CHF (congestive heart failure) (HCC) 09/02/2017  . COPD (chronic obstructive pulmonary disease) (HCC)   . CORONARY ARTERY DISEASE 11/23/2009  . 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 DEGENERATIVE JOINT DISEASE 11/23/2009  . DEPRESSION 11/23/2009  . FATIGUE 11/23/2009  . GAIT DISTURBANCE 12/10/2009  . HEMOPTYSIS UNSPECIFIED 05/07/2010  . High cholesterol   . HYPERTENSION 07/30/2009  . HYPOTHYROIDISM 07/30/2009  . Ischemic cardiomyopathy 09/02/2017  . 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 On home oxygen therapy    "2-3L; 24/7" (09/10/2016)  . OSA on CPAP   . PTSD (post-traumatic stress disorder) 03/10/2012  . 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 RA (rheumatoid arthritis) (HCC) 06/11/2011   "qwhere" (09/10/2016)  . RESPIRATORY FAILURE,  CHRONIC 07/31/2009  . Scleritis of both eyes 03/17/2014  . 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 Type II diabetes mellitus (HCC)     Medications:  Scheduled:  . ALPRAZolam  0.5 mg Oral BID  . aspirin EC  81 mg Oral Daily  . atorvastatin  40 mg Oral Daily  . carvedilol  6.25 mg Oral BID WC  . cholecalciferol  2,000 Units Oral Daily  . insulin aspart  0-15 Units Subcutaneous TID WC  . insulin aspart  0-5 Units Subcutaneous QHS  . insulin aspart  3 Units Subcutaneous TID WC  . insulin glargine  40 Units Subcutaneous QHS  . levothyroxine  188 mcg Oral QAC breakfast  . losartan  50 mg Oral Daily  . pneumococcal 23 valent vaccine  0.5 mL Intramuscular Tomorrow-1000  . predniSONE  10 mg Oral Q breakfast  . sertraline  50 mg Oral Daily  . spironolactone  12.5 mg Oral Daily  . tamsulosin  0.8 mg Oral QHS  . tiotropium  18 mcg Inhalation Daily   Infusions:  . sodium chloride 75 mL/hr at 09/02/17 1358  . amiodarone 30 mg/hr (09/02/17 1500)  . diltiazem (CARDIZEM) infusion  5 mg/hr (09/02/17 1110)  . heparin 1,500 Units/hr (09/02/17 2101)  . piperacillin-tazobactam (ZOSYN)  IV 3.375 g (09/02/17 2000)  . vancomycin 1,500 mg (09/02/17 2106)    Assessment: 77 yo male with urosepsis picture now with new onset Afib. Per cards, to try and cardiovert with amiodarone and diltiazem in next 48hr with possible [facility] late tomorrow and also start IV heparin per Rx. Baseline CBC drawn - low plt count at 103k, monitor closely   2/6  HL is 0.25, subtherapeutic  No bleeding or line issues per RN Today, 2/7  0259 HL=0.45 at goal --> Note plts = 84 (down from 103), no infusion or bleeding issues per RN   Goal of Therapy:  Heparin level 0.3-0.7 Monitor platelets by anticoagulation protocol: Yes   Plan:   Continue heparin drip at 1500 units/hr  Recheck HL in 8 hours  CBC ordered with AM labs  Monitor for signs and symptoms of bleeding     Lorenza Evangelist 09/03/2017 4:37 AM

## 2017-09-03 NOTE — Progress Notes (Signed)
PROGRESS NOTE    [name]  JKD:[id number] DOB: Sep 10, 1940 DOA: 08/31/2017 PCP: Sharlene Dory, DO    Brief Narrative:  Patient is a 77 year old gentleman history of hypertension, hypothyroidism, dyslipidemia, diabetes, pulmonary fibrosis, COPD, ischemic cardiomyopathy, chronic systolic heart failure, aortic aneurysm, presenting to the ED with fever chills dysuria, increasing shortness of breath at rest and on exertion requiring increased O2 from his baseline of 2 L to 3 L.  Patient pancultured placed empirically on IV vancomycin and IV Zosyn.  CT chest which was done showed nodular opacities within the left lung improved from prior study, chronic biapical scarring, associated diffuse right-sided pleural calcification. On hospital day 2 on 09/02/2017 at 6:45 AM patient was noted to be tachycardic concerns for probable A. fib heart rate sustaining in the 130s. Patient seen by cardiology transferred to the stepdown unit and placed on amiodarone and Cardizem drip.  Patient subsequently spontaneously converted to normal sinus rhythm on 09/03/2017.   Assessment & Plan:   Principal Problem:   Sepsis secondary to UTI [facility]) Active Problems:   New onset atrial fibrillation (HCC)   SOB (shortness of breath)   Hypothyroidism   Essential hypertension   Coronary atherosclerosis   OSA on CPAP   Hyperlipidemia   Sepsis (HCC)   Diabetes mellitus with complication, with long-term current use of insulin (HCC)   Leukocytosis   Anxiety and depression   COPD (chronic obstructive pulmonary disease) (HCC)   Ischemic cardiomyopathy   Chronic systolic CHF (congestive heart failure) (HCC)   Aortic aneurysm (HCC)   Acute cystitis without hematuria  #1 sepsis felt secondary to Enterococcus faecalis UTI Patient presented with fevers, chills, dysuria urinalysis consistent with a UTI.  On admission patient noted to have a fever with temperatures of 101, O2 sats of 90% on 3 L nasal cannula, elevated  lactic acid level, with a leukocytosis.  Patient pancultured with results pending.  Repeat lactic acid level trended down.  Urine cultures with 50,000 colonies of Enterococcus faecalis which was sensitive to the penicillins, fluoroquinolones, vancomycin and Macrobid.  Discontinue IV Zosyn and place empirically on IV ampicillin.  If blood cultures remain negative tomorrow will discontinue IV vancomycin.Follow.  2.  New onset A. Fib CHA2DS2VASC = 6  Patient noted to be tachycardic at 6:45 AM the morning of 09/02/2017, and noted to be going in and out of A. fib per telemetry telemetry monitor.  Patient with a chronic left bundle branch block.  Heart rate in the 130s yesterday and subsequently converted back to NSR.  Patient currently asymptomatic with clinical improvement. Cardiac enzymes minimally elevated. TSH low and synthroid adjusted.  Chest x-ray without any infiltrate.  2D echo done with EF of 40-45%, grade 2 diastolic dysfunction, moderate LVH.  Patient seen in consultation by cardiology and initially placed on amiodarone and Cardizem drip.  Cardizem drip discontinued and patient on IV amiodarone after conversion to normal sinus rhythm and bradycardia.  Patient's bisoprolol was changed to oral Coreg per cardiology.  Amiodarone to be changed to oral per cardiology.  No need for cardioversion at this time.  Patient started on Eliquis per cardiology.   3.  COPD Stable.  Continue Spiriva and breo ellipta.  Nebs as needed.  4.  Hypothyroidism TSH 0.040.  Will decrease Synthroid dose to 188 MCG's daily.  Patient will need repeat thyroid function studies done in about 4-6 weeks.  Outpatient follow-up.  5.  Diabetes mellitus II Last hemoglobin A1c was 9.5 ON 10/02/2016.  Hemoglobin A1c  is 9.3 on 09/03/2017.  CBGs ranging from 161-280.  Continue Lantus.  Sliding scale insulin.  Discontinued semaglutide. follow.  6.  Ischemic cardiomyopathy/chronic systolic heart failure Due to problem #2 cardiac enzymes were  cycled which were minimally elevated.  TSH which was done was decreased and as such patient's home dose Synthroid adjusted.  Patient does not look volume overloaded on examination.  Patient's bisoprolol has been changed to Coreg per cardiology.  2D echo obtained had a EF of 40-45%, grade 2 diastolic dysfunction, moderate LVH, mildly dilated right atrium.  Continue aspirin, Lipitor, Cozaar, spironolactone.  Per cardiology okay to discontinue patient's Plavix as patient now being transitioned to anticoagulation.  Per cardiology.   7.  Enterococcus faecalis UTI Urine cultures with 50,000 colonies of Enterococcus faecalis.  Will change IV Zosyn to IV ampicillin.  8.  Shortness of breath Likely secondary to problem #2.  CT chest on admission with no significant acute infiltrate, nodular opacities within the left lung have improved from prior study reflecting improving chronic infection.  Mild focal aneurysmal dilatation of the aortic arch to 3.7 cm diameter.  Chest x-ray done 09/02/2017 with no acute infiltrate.  Patient with improvement in shortness of breath which she states is close to his baseline now.  See problem #2.  9.  Hypertension Bisoprolol was changed to Coreg 6.25 mg twice daily per cardiology and dose adjusted today with parameters to hold for heart rate less than 55.  Continue losartan, Aldactone.  10.  Obstructive sleep apnea CPAP nightly.  11.  Hyperlipidemia Fasting lipid panel with a LDL of 61.  Continue statin.  12.  Depression/anxiety Stable.  Continue home regimen of Zoloft and Xanax.  13.  History of aortic aneurysm and history of thoracic aortic ductus diverticulum Outpatient follow-up for CTA in June 2019, per cardiologist note from 06/23/2017.     DVT prophylaxis: Heparin Code Status: Full Family Communication: Updated patient.  No family at bedside. Disposition Plan: In the stepdown unit for now.  To be determined.   Consultants:   Cardiology: Dr.Nishan  09/02/2017  Procedures:   CT chest 08/31/2017  Chest x-ray 08/31/2017, 09/02/2017  2D echo 09/02/2017  Antimicrobials:   IV Zosyn 08/31/2017  IV vancomycin 08/31/2017   Subjective: Patient sitting up in bed eating breakfast.  Patient states feels much better today.  Patient denies any dizziness.  No chest pain.  Patient states shortness of breath close to his baseline.  Patient noted to convert back into normal sinus rhythm and as such IV Cardizem discontinued.  Dysuria improved.   Objective: Vitals:   09/03/17 1200 09/03/17 1300 09/03/17 1400 09/03/17 1500  BP: (!) 151/62 (!) 129/55 (!) 158/43 (!) 147/53  Pulse: (!) 53 67 62 (!) 53  Resp: (!) 23 16 16 20   Temp:      TempSrc:      SpO2: 90% (!) 85% 94% 91%  Weight:      Height:        Intake/Output Summary (Last 24 hours) at 09/03/2017 1617 Last data filed at 09/03/2017 1500 Gross per 24 hour  Intake 3099.55 ml  Output 1351 ml  Net 1748.55 ml   Filed Weights   08/31/17 1944 09/02/17 0603 09/03/17 0313  Weight: 95.7 kg (211 lb) 92.9 kg (204 lb 12.9 oz) 96.6 kg (212 lb 15.4 oz)    Examination:  General exam: Sitting up Respiratory system: Clear to auscultation bilaterally with no crackles, no rhonchi, no coarse breath sounds, no wheezing. Respiratory effort normal. Cardiovascular  system: Regular rate and rhythm no murmurs rubs or gallops.  No JVD.  No pedal edema.  Gastrointestinal system: Abdomen is soft, nontender, nondistended, positive bowel sounds.  No hepatosplenomegaly. Central nervous system: Alert and oriented. No focal neurological deficits. Extremities: Symmetric 5 x 5 power. Skin: No rashes, lesions or ulcers Psychiatry: Judgement and insight appear normal. Mood & affect appropriate.     Data Reviewed: I have personally reviewed following labs and imaging studies  CBC: Recent Labs  Lab 08/31/17 1953 08/31/17 2005 09/01/17 1749 09/02/17 0622 09/03/17 0259  WBC 10.8*  --  8.7 7.9 6.8  NEUTROABS 8.8*  --   --    --  3.8  HGB 11.7* 11.9* 11.5* 11.1* 10.3*  HCT 35.7* 35.0* 35.2* 34.1* 32.5*  MCV 84.6  --  83.4 85.0 85.5  PLT 129*  --  124* 103* 84*   Basic Metabolic Panel: Recent Labs  Lab 08/31/17 2005 09/01/17 1749 09/02/17 0622 09/03/17 0259  NA 135  --  139 141  K 4.5  --  3.7 3.6  CL 99*  --  103 110  CO2  --   --  24 25  GLUCOSE 244*  --  225* 165*  BUN 20  --  23* 23*  CREATININE 1.10 0.90 1.17 1.12  CALCIUM  --   --  8.8* 8.1*  MG  --   --  1.5* 2.1   GFR: Estimated Creatinine Clearance: 67.1 mL/min (by C-G formula based on SCr of 1.12 mg/dL). Liver Function Tests: Recent Labs  Lab 08/31/17 2055  AST 25  ALT 24  ALKPHOS 57  BILITOT 0.7  PROT 6.6  ALBUMIN 3.9   No results for input(s): LIPASE, AMYLASE in the last 168 hours. No results for input(s): AMMONIA in the last 168 hours. Coagulation Profile: Recent Labs  Lab 09/01/17 1749  INR 1.07   Cardiac Enzymes: Recent Labs  Lab 09/02/17 0809 09/02/17 1343 09/02/17 1907  TROPONINI 0.04* 0.17* 0.26*   BNP (last 3 results) Recent Labs    10/20/16 1038  PROBNP 188   HbA1C: Recent Labs    09/03/17 0259  HGBA1C 9.3*   CBG: Recent Labs  Lab 09/02/17 1134 09/02/17 1657 09/02/17 2104 09/03/17 0807 09/03/17 1235  GLUCAP 283* 251* 280* 161* 257*   Lipid Profile: Recent Labs    09/03/17 0259  CHOL 108  HDL 31*  LDLCALC 61  TRIG 79  CHOLHDL 3.5   Thyroid Function Tests: Recent Labs    09/02/17 0809  TSH 0.040*   Anemia Panel: No results for input(s): VITAMINB12, FOLATE, FERRITIN, TIBC, IRON, RETICCTPCT in the last 72 hours. Sepsis Labs: Recent Labs  Lab 08/31/17 2233 09/01/17 1749 09/01/17 2039 09/02/17 0809  PROCALCITON  --  <0.10  --   --   LATICACIDVEN 1.01 1.5 2.6* 2.1*    Recent Results (from the past 240 hour(s))  Blood Culture (routine x 2)     Status: None (Preliminary result)   Collection Time: 08/31/17  8:45 PM  Result Value Ref Range Status   Specimen Description    Final    BLOOD LEFT ANTECUBITAL Performed at [facility], [address] [place], [place] [address]    Special Requests   Final    BOTTLES DRAWN AEROBIC AND ANAEROBIC Blood Culture adequate volume Performed at [facility], [address] [place], [place] [address]    Culture   Final    NO GROWTH 3 DAYS Performed at  [facility], [address] [place], [place] [address]    Report Status PENDING  Incomplete  Blood Culture (routine x 2)     Status: None (Preliminary result)   Collection Time: 08/31/17  9:00 PM  Result Value Ref Range Status   Specimen Description   Final    BLOOD RIGHT ANTECUBITAL Performed at [facility], [address] [place], [place] [address]    Special Requests   Final    BOTTLES DRAWN AEROBIC AND ANAEROBIC Blood Culture adequate volume Performed at [facility], [address] [place], [place] [address]    Culture   Final    NO GROWTH 3 DAYS Performed at [facility], [address], [place], [place] [address]    Report Status PENDING  Incomplete  Urine Culture     Status: Abnormal   Collection Time: 08/31/17  9:45 PM  Result Value Ref Range Status   Specimen Description   Final    URINE, RANDOM Performed at [facility], [address] [place], [place] [address]    Special Requests   Final    NONE Performed at [facility], [address] [place], [place] [address]    Culture 50,000 COLONIES/mL ENTEROCOCCUS FAECALIS (A)  Final   Report Status 09/03/2017 FINAL  Final   Organism ID, Bacteria ENTEROCOCCUS FAECALIS (A)  Final      Susceptibility   Enterococcus faecalis - MIC*    AMPICILLIN <=2 SENSITIVE Sensitive     LEVOFLOXACIN 1 SENSITIVE Sensitive     NITROFURANTOIN <=16 SENSITIVE Sensitive     VANCOMYCIN 1 SENSITIVE Sensitive     * 50,000 COLONIES/mL ENTEROCOCCUS FAECALIS  MRSA PCR Screening     Status: None   Collection Time: 09/02/17 11:17 AM  Result  Value Ref Range Status   MRSA by PCR NEGATIVE NEGATIVE Final    Comment:        The GeneXpert MRSA Assay (FDA approved for NASAL specimens only), is one component of a comprehensive MRSA colonization surveillance program. It is not intended to diagnose MRSA infection nor to guide or monitor treatment for MRSA infections. Performed at [facility], [address], [place], [place] [address]          Radiology Studies: Dg Chest Port 1 View  Result Date: 09/02/2017 CLINICAL DATA:  Shortness of breath, history CHF, cardiomyopathy, pulmonary fibrosis, coronary artery disease, COPD, type II diabetes mellitus, hypertension EXAM: PORTABLE CHEST 1 VIEW COMPARISON:  Portable exam 1318 hours compared to 08/31/2017 FINDINGS: Normal heart size, mediastinal contours, and pulmonary vascularity. Pleuroparenchymal scarring at RIGHT apex and at lateral RIGHT base unchanged. Emphysematous and chronic interstitial changes, stable. No acute infiltrate, pleural effusion or pneumothorax. Bones demineralized. IMPRESSION: Changes of COPD and chronic interstitial disease/fibrosis. No acute abnormalities. Electronically Signed   By: Ulyses Southward M.D.   On: 09/02/2017 13:51        Scheduled Meds: . ALPRAZolam  0.5 mg Oral BID  . amiodarone  200 mg Oral TID  . apixaban  5 mg Oral BID  . aspirin EC  81 mg Oral Daily  . atorvastatin  40 mg Oral Daily  . carvedilol  6.25 mg Oral BID WC  . cholecalciferol  2,000 Units Oral Daily  . insulin aspart  0-15 Units Subcutaneous TID WC  . insulin aspart  0-5 Units Subcutaneous QHS  . insulin aspart  3 Units Subcutaneous TID  WC  . insulin glargine  40 Units Subcutaneous QHS  . levothyroxine  188 mcg Oral QAC breakfast  . losartan  50 mg Oral Daily  . pneumococcal 23 valent vaccine  0.5 mL Intramuscular Tomorrow-1000  . predniSONE  10 mg Oral Q breakfast  . sertraline  50 mg Oral Daily  . spironolactone  12.5 mg Oral Daily  . tamsulosin  0.8 mg  Oral QHS  . tiotropium  18 mcg Inhalation Daily   Continuous Infusions: . sodium chloride 75 mL/hr at 09/03/17 0514  . ampicillin (OMNIPEN) IV    . propofol    . vancomycin Stopped (09/02/17 2306)     LOS: 2 days    Time spent: 45 minutes    Ramiro Harvest, MD [facility] Pager [phone]  If 7PM-7AM, please contact night-coverage [email or web address] Password [facility] 09/03/2017, 4:17 PM

## 2017-09-03 NOTE — H&P (Signed)
Pt has hx of OSA. Pt refused CPAP.

## 2017-09-03 NOTE — Progress Notes (Signed)
Progress Note  Patient Name: [name] Date of Encounter: 09/03/2017  Primary Cardiologist: Dr Jens Som   Subjective   Hates being tied down to all the drips and equipment, not aware of irreg HR, otherwise is improving.  Inpatient Medications    Scheduled Meds: . ALPRAZolam  0.5 mg Oral BID  . aspirin EC  81 mg Oral Daily  . atorvastatin  40 mg Oral Daily  . carvedilol  6.25 mg Oral BID WC  . cholecalciferol  2,000 Units Oral Daily  . insulin aspart  0-15 Units Subcutaneous TID WC  . insulin aspart  0-5 Units Subcutaneous QHS  . insulin aspart  3 Units Subcutaneous TID WC  . insulin glargine  40 Units Subcutaneous QHS  . levothyroxine  188 mcg Oral QAC breakfast  . losartan  50 mg Oral Daily  . pneumococcal 23 valent vaccine  0.5 mL Intramuscular Tomorrow-1000  . predniSONE  10 mg Oral Q breakfast  . sertraline  50 mg Oral Daily  . spironolactone  12.5 mg Oral Daily  . tamsulosin  0.8 mg Oral QHS  . tiotropium  18 mcg Inhalation Daily   Continuous Infusions: . sodium chloride 75 mL/hr at 09/03/17 0514  . amiodarone 30 mg/hr (09/03/17 0230)  . diltiazem (CARDIZEM) infusion 5 mg/hr (09/02/17 1110)  . heparin 1,500 Units/hr (09/03/17 0000)  . piperacillin-tazobactam (ZOSYN)  IV 3.375 g (09/03/17 0400)  . vancomycin Stopped (09/02/17 2306)   PRN Meds: acetaminophen, albuterol, meclizine, ondansetron   Vital Signs    Vitals:   09/02/17 2329 09/03/17 0000 09/03/17 0313 09/03/17 0400  BP:  (!) 151/89  (!) 125/103  Pulse:  (!) 58  (!) 59  Resp:  (!) 25  (!) 22  Temp: 97.7 F (36.5 C)  97.6 F (36.4 C)   TempSrc: Oral  Oral   SpO2:  96%  93%  Weight:   212 lb 15.4 oz (96.6 kg)   Height:        Intake/Output Summary (Last 24 hours) at 09/03/2017 0748 Last data filed at 09/03/2017 0600 Gross per 24 hour  Intake 3036.13 ml  Output 401 ml  Net 2635.13 ml   Filed Weights   08/31/17 1944 09/02/17 0603 09/03/17 0313  Weight: 211 lb (95.7 kg) 204 lb 12.9 oz  (92.9 kg) 212 lb 15.4 oz (96.6 kg)    Telemetry    SR, w/ PACs - Personally Reviewed  ECG    02/06 Atrial fib w/ RVR, LBBB, HR 134 - Personally Reviewed 02/07 ECG pending  Physical Exam   General: Well developed, well nourished, male appearing in no acute distress. Head: Normocephalic, atraumatic.  Neck: Supple without bruits, JVD not elevated. Lungs:  Resp regular and unlabored, decreased BS bases Heart: slightly irreg R&R, S1, S2, no S3, S4, or murmur; no rub. Abdomen: Soft, non-tender, non-distended with normoactive bowel sounds. No hepatomegaly. No rebound/guarding. No obvious abdominal masses. Extremities: No clubbing, cyanosis, no edema. Distal pedal pulses are 2+ bilaterally. Neuro: Alert and oriented X 3. Moves all extremities spontaneously. Psych: Normal affect.  Labs    Hematology Recent Labs  Lab 09/01/17 1749 09/02/17 0622 09/03/17 0259  WBC 8.7 7.9 6.8  RBC 4.22 4.01* 3.80*  HGB 11.5* 11.1* 10.3*  HCT 35.2* 34.1* 32.5*  MCV 83.4 85.0 85.5  MCH 27.3 27.7 27.1  MCHC 32.7 32.6 31.7  RDW 15.0 14.8 14.9  PLT 124* 103* 84*    Chemistry Recent Labs  Lab 08/31/17 2005 08/31/17 2055 09/01/17 1749  09/02/17 0622 09/03/17 0259  NA 135  --   --  139 141  K 4.5  --   --  3.7 3.6  CL 99*  --   --  103 110  CO2  --   --   --  24 25  GLUCOSE 244*  --   --  225* 165*  BUN 20  --   --  23* 23*  CREATININE 1.10  --  0.90 1.17 1.12  CALCIUM  --   --   --  8.8* 8.1*  PROT  --  6.6  --   --   --   ALBUMIN  --  3.9  --   --   --   AST  --  25  --   --   --   ALT  --  24  --   --   --   ALKPHOS  --  57  --   --   --   BILITOT  --  0.7  --   --   --   GFRNONAA  --   --  >60 59* >60  GFRAA  --   --  >60 >60 >60  ANIONGAP  --   --   --  12 6     Cardiac Enzymes Recent Labs  Lab 09/02/17 0809 09/02/17 1343 09/02/17 1907  TROPONINI 0.04* 0.17* 0.26*    Radiology    Dg Chest 2 View  Result Date: 08/31/2017 CLINICAL DATA:  Shortness of breath and chills  EXAM: CHEST  2 VIEW COMPARISON:  July 16, 2017 and Dec 25, 2016 FINDINGS: There is scarring throughout portions of the right lung with chronic blunting of the right costophrenic angle. There is also chronic blunting of the left costophrenic angle with scarring in the left base. There is no edema or consolidation. The heart size and pulmonary vascularity are within normal limits. No adenopathy. No evident bone lesions. IMPRESSION: Areas of scarring bilaterally, more on the right than on the left. Blunting of the costophrenic angles bilaterally, chronic. No edema or consolidation. Stable cardiac silhouette. Electronically Signed   By: Bretta Bang III M.D.   On: 08/31/2017 20:28   Ct Chest W Contrast  Result Date: 08/31/2017 CLINICAL DATA:  Acute onset of shortness of breath and chills. EXAM: CT CHEST WITH CONTRAST TECHNIQUE: Multidetector CT imaging of the chest was performed during intravenous contrast administration. CONTRAST:  ISOVUE-300 IOPAMIDOL (ISOVUE-300) INJECTION 61% COMPARISON:  Chest radiograph performed earlier today 7:53 p.m., and CT of the chest performed 06/12/2017 FINDINGS: Cardiovascular: The heart is normal in size. Scattered coronary artery calcifications are seen. There is mild focal aneurysmal dilatation of the aortic arch to 3.7 cm in diameter, with mild underlying calcification. The great vessels demonstrate scattered calcification but are otherwise grossly unremarkable. Mediastinum/Nodes: The mediastinum is otherwise unremarkable in appearance. No mediastinal lymphadenopathy is seen. No pericardial effusion is identified. The thyroid gland is not well characterized. No axillary lymphadenopathy is seen. Lungs/Pleura: Chronic scarring is noted at the lung apices, with associated calcification on the right. Additional nodular left-sided airspace opacities have improved from the prior study, reflecting improving chronic infection. Diffuse right-sided pleural calcifications are  seen. No pleural effusion or pneumothorax is seen. Upper Abdomen: The visualized portions of the liver and spleen are unremarkable. Stones are seen dependently within the gallbladder. The visualized portions of the pancreas, adrenal glands and kidneys are within normal limits. Musculoskeletal: No acute osseous abnormalities are identified. The visualized musculature  is unremarkable in appearance. IMPRESSION: 1. Nodular opacities within the left lung have improved from the prior study, reflecting improving chronic infection. 2. Chronic biapical scarring, with associated diffuse right-sided pleural calcification. 3. 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 4. Cholelithiasis.  Gallbladder otherwise unremarkable. Electronically Signed   By: Roanna Raider M.D.   On: 08/31/2017 22:12   Dg Chest Port 1 View  Result Date: 09/02/2017 CLINICAL DATA:  Shortness of breath, history CHF, cardiomyopathy, pulmonary fibrosis, coronary artery disease, COPD, type II diabetes mellitus, hypertension EXAM: PORTABLE CHEST 1 VIEW COMPARISON:  Portable exam 1318 hours compared to 08/31/2017 FINDINGS: Normal heart size, mediastinal contours, and pulmonary vascularity. Pleuroparenchymal scarring at RIGHT apex and at lateral RIGHT base unchanged. Emphysematous and chronic interstitial changes, stable. No acute infiltrate, pleural effusion or pneumothorax. Bones demineralized. IMPRESSION: Changes of COPD and chronic interstitial disease/fibrosis. No acute abnormalities. Electronically Signed   By: Ulyses Southward M.D.   On: 09/02/2017 13:51     Cardiac Studies   ECHO: 09/02/2017 - Left ventricle: The cavity size was normal. Wall thickness was   increased in a pattern of moderate LVH. Systolic function was   mildly to moderately reduced. The estimated ejection fraction was   in the range of 40% to 45%. Features are consistent with a   pseudonormal left  ventricular filling pattern, with concomitant   abnormal relaxation and increased filling pressure (grade 2   diastolic dysfunction). - Right atrium: The atrium was mildly dilated.   Patient Profile     77 y.o. 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 Cards saw 02/06 for new onset afib  Assessment & Plan    1. Rapid atrial fib - spont conversion to SR on amio, IV Cardizem d/c'd - bisoprolol 5 mg qd changed to Coreg 6.25 mg bid, but no doses given due to sinus brady, will decrease dose, hold for HR < 55 - MD advise on changing amio to po  - only mild R atrial dilatation, hopefully will maintain SR  2. Anticoag - currently on heparin - CHA2DS2-VASc= 6 (age x 2, HTN, DM, CAD, CHF) - platelets were normal 05/2017, came in at 129 and are now 30 - feel he should go on oral anticoag, MD advise on Eliquis  3. Chronic combined systolic and diastolic CHF - EF unchanged on echo 02/06 - no sig volume overload on CXR or CT chest - watch carefully for sx, I/O and weights are not accurate - Continue spiro, ARB, BB (as HR will allow)  4. CAD - no ischemic sx - ok to stop Plavix now since anticoag needed, continue ASA, BB, statin  Otherwise, per IM Principal Problem:   Sepsis secondary to UTI [facility]) Active Problems:   Hypothyroidism   Essential hypertension   Coronary atherosclerosis   OSA on CPAP   Hyperlipidemia   Diabetes mellitus with complication, with long-term current use of insulin (HCC)   Leukocytosis   Anxiety and depression   COPD (chronic obstructive pulmonary disease) (HCC)   Tachycardia   SOB (shortness of breath)   Ischemic cardiomyopathy   Chronic systolic CHF (congestive heart failure) (HCC)   Aortic aneurysm (HCC)    Signed, Theodore Demark , PA-C 7:48 AM 09/03/2017 Pager: [phone]  Patient examined chart reviewed Doing better eating breakfast  Lungs clear no murmur  Telemetry with NSR Change iv meds to PO start eliquis Improved overall  Charlton Haws

## 2017-09-03 NOTE — Progress Notes (Signed)
ANTICOAGULATION CONSULT NOTE - Follow Consult  Pharmacy Consult for IV heparin --> Eliquis Indication: atrial fibrillation  No Known Allergies  Patient Measurements: Height: 6\' 3"  (190.5 cm) Weight: 212 lb 15.4 oz (96.6 kg) IBW/kg (Calculated) : 84.5 Heparin Dosing Weight: 92.9 kg  Vital Signs: Temp: 97.3 F (36.3 C) (02/07 0800) Temp Source: Oral (02/07 0800) BP: 149/88 (02/07 0800) Pulse Rate: 54 (02/07 0800)  Labs: Recent Labs    09/01/17 1749 09/02/17 0622 09/02/17 0809 09/02/17 1343 09/02/17 1907 09/03/17 0259  HGB 11.5* 11.1*  --   --   --  10.3*  HCT 35.2* 34.1*  --   --   --  32.5*  PLT 124* 103*  --   --   --  84*  APTT 30  --   --   --   --   --   LABPROT 13.8  --   --   --   --   --   INR 1.07  --   --   --   --   --   HEPARINUNFRC  --   --   --   --  0.25* 0.45  CREATININE 0.90 1.17  --   --   --  1.12  TROPONINI  --   --  0.04* 0.17* 0.26*  --     Estimated Creatinine Clearance: 67.1 mL/min (by C-G formula based on SCr of 1.12 mg/dL).   Medical History: Past Medical History:  Diagnosis Date  . Anxiety   . Aortic aneurysm (HCC) 09/02/2017  . BENIGN PROSTATIC HYPERTROPHY 11/23/2009  . Cardiomyopathy (HCC) 08/28/2016  . Chronic systolic CHF (congestive heart failure) (HCC) 09/02/2017  . COPD (chronic obstructive pulmonary disease) (HCC)   . CORONARY ARTERY DISEASE 11/23/2009  . 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 DEGENERATIVE JOINT DISEASE 11/23/2009  . DEPRESSION 11/23/2009  . FATIGUE 11/23/2009  . GAIT DISTURBANCE 12/10/2009  . HEMOPTYSIS UNSPECIFIED 05/07/2010  . High cholesterol   . HYPERTENSION 07/30/2009  . HYPOTHYROIDISM 07/30/2009  . Ischemic cardiomyopathy 09/02/2017  . 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 On home oxygen therapy    "2-3L; 24/7" (09/10/2016)  . OSA on CPAP   . PTSD (post-traumatic stress disorder) 03/10/2012  . 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 RA (rheumatoid arthritis) (HCC) 06/11/2011   "qwhere" (09/10/2016)  . RESPIRATORY  FAILURE, CHRONIC 07/31/2009  . Scleritis of both eyes 03/17/2014  . 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 Type II diabetes mellitus (HCC)     Assessment: 77 yo male with urosepsis picture and new onset Afib. Stared on heparin infusion, and spontaneously converted to SR with amiodarone and diltiazem.  Diltiazem d/c'd, amiodarone switched to PO and cardiology has also requested transition from heparin infusion to Eliquis today.  SCr <1.5, weight>60kg, age<80 yo so no criteria for Eliquis dose reduction Platelets low on admission and trended down to 84K today. Hgb 10.3.  No bleeding noted. Heparin level was therapeutic early this morning. Per cardiology, stop Plavix and continue aspirin for CAD since now starting Eliquis.  Goal of Therapy:  Prevention of stroke and systemic embolism - CHA2DS2-VASc= 6 (age x 2, HTN, DM, CAD, CHF)   Plan:   Stop heparin infusion.  Start Eliquis 5 mg BID.  Will provide Eliquis education and discount card prior to discharge.  73 09/03/2017 9:29 AM

## 2017-09-03 NOTE — Discharge Instructions (Signed)

## 2017-09-04 ENCOUNTER — Encounter (HOSPITAL_COMMUNITY): Payer: Self-pay

## 2017-09-04 LAB — CBC
HCT: 31.5 % — ABNORMAL LOW (ref 39.0–52.0)
Hemoglobin: 10.2 g/dL — ABNORMAL LOW (ref 13.0–17.0)
MCH: 27.6 pg (ref 26.0–34.0)
MCHC: 32.4 g/dL (ref 30.0–36.0)
MCV: 85.4 fL (ref 78.0–100.0)
PLATELETS: 94 10*3/uL — AB (ref 150–400)
RBC: 3.69 MIL/uL — AB (ref 4.22–5.81)
RDW: 15 % (ref 11.5–15.5)
WBC: 7.5 10*3/uL (ref 4.0–10.5)

## 2017-09-04 LAB — BASIC METABOLIC PANEL
Anion gap: 5 (ref 5–15)
BUN: 20 mg/dL (ref 6–20)
CHLORIDE: 111 mmol/L (ref 101–111)
CO2: 27 mmol/L (ref 22–32)
Calcium: 8.4 mg/dL — ABNORMAL LOW (ref 8.9–10.3)
Creatinine, Ser: 0.9 mg/dL (ref 0.61–1.24)
GFR calc Af Amer: >60 mL/min (ref >60)
GLUCOSE: 134 mg/dL — AB (ref 65–99)
POTASSIUM: 4 mmol/L (ref 3.5–5.1)
SODIUM: 143 mmol/L (ref 135–145)

## 2017-09-04 LAB — GLUCOSE, CAPILLARY
GLUCOSE-CAPILLARY: 138 mg/dL — AB (ref 65–99)
GLUCOSE-CAPILLARY: 162 mg/dL — AB (ref 65–99)
GLUCOSE-CAPILLARY: 189 mg/dL — AB (ref 65–99)
Glucose-Capillary: 214 mg/dL — ABNORMAL HIGH (ref 65–99)

## 2017-09-04 MED ORDER — AMOXICILLIN-POT CLAVULANATE 875-125 MG PO TABS
1.0000 | ORAL_TABLET | Freq: Two times a day (BID) | ORAL | Status: DC
  Administered 2017-09-04 – 2017-09-05 (×3): 1 via ORAL
  Filled 2017-09-04 (×3): qty 1

## 2017-09-04 MED ORDER — LOSARTAN POTASSIUM 50 MG PO TABS
75.0000 mg | ORAL_TABLET | Freq: Every day | ORAL | Status: DC
  Administered 2017-09-04: 13:00:00 75 mg via ORAL
  Filled 2017-09-04: qty 1

## 2017-09-04 NOTE — Progress Notes (Signed)
Progress Note  Patient Name: [name] Date of Encounter: 09/04/2017  Primary Cardiologist: Dr Jens Som   Subjective   Can tell he is getting better, hopes to go home tomorrow.   Inpatient Medications    Scheduled Meds: . ALPRAZolam  0.5 mg Oral BID  . amiodarone  200 mg Oral TID  . apixaban  5 mg Oral BID  . aspirin EC  81 mg Oral Daily  . atorvastatin  40 mg Oral Daily  . carvedilol  6.25 mg Oral BID WC  . cholecalciferol  2,000 Units Oral Daily  . insulin aspart  0-15 Units Subcutaneous TID WC  . insulin aspart  0-5 Units Subcutaneous QHS  . insulin aspart  3 Units Subcutaneous TID WC  . insulin glargine  40 Units Subcutaneous QHS  . levothyroxine  188 mcg Oral QAC breakfast  . losartan  50 mg Oral Daily  . pneumococcal 23 valent vaccine  0.5 mL Intramuscular Tomorrow-1000  . predniSONE  10 mg Oral Q breakfast  . sertraline  50 mg Oral Daily  . spironolactone  12.5 mg Oral Daily  . tamsulosin  0.8 mg Oral QHS  . tiotropium  18 mcg Inhalation Daily   Continuous Infusions: . ampicillin (OMNIPEN) IV Stopped (09/04/17 0729)  . vancomycin Stopped (09/04/17 0026)   PRN Meds: acetaminophen, albuterol, meclizine, ondansetron   Vital Signs    Vitals:   09/04/17 0045 09/04/17 0100 09/04/17 0257 09/04/17 0400  BP: (!) 144/41   (!) 163/70  Pulse: 60 (!) 56    Resp: (!) 22 20  (!) 28  Temp:   98 F (36.7 C)   TempSrc:   Axillary   SpO2: 92% 94%  96%  Weight:      Height:        Intake/Output Summary (Last 24 hours) at 09/04/2017 0752 Last data filed at 09/04/2017 1610 Gross per 24 hour  Intake 3151.25 ml  Output 951 ml  Net 2200.25 ml   Filed Weights   08/31/17 1944 09/02/17 0603 09/03/17 0313  Weight: 211 lb (95.7 kg) 204 lb 12.9 oz (92.9 kg) 212 lb 15.4 oz (96.6 kg)    Telemetry    SR, sinus brady 50s Since yesterday - Personally Reviewed  ECG    02/06 Atrial fib w/ RVR, LBBB, HR 134 - Personally Reviewed  Physical Exam   General: Well  developed, well nourished, male appearing in no acute distress. Head: Normocephalic, atraumatic.  Neck: Supple without bruits, JVD not elevated. Lungs:  Resp regular and unlabored, decreased BS bases Heart: RRR, S1, S2, no S3, S4, or murmur; no rub. Abdomen: Soft, non-tender, non-distended with normoactive bowel sounds. No hepatomegaly. No rebound/guarding. No obvious abdominal masses. Extremities: No clubbing, cyanosis, no edema. Distal pedal pulses are 2+ bilaterally. Neuro: Alert and oriented X 3. Moves all extremities spontaneously. Psych: Normal affect.  Labs    Hematology Recent Labs  Lab 09/02/17 0622 09/03/17 0259 09/04/17 0232  WBC 7.9 6.8 7.5  RBC 4.01* 3.80* 3.69*  HGB 11.1* 10.3* 10.2*  HCT 34.1* 32.5* 31.5*  MCV 85.0 85.5 85.4  MCH 27.7 27.1 27.6  MCHC 32.6 31.7 32.4  RDW 14.8 14.9 15.0  PLT 103* 84* 94*    Chemistry Recent Labs  Lab 08/31/17 2055  09/02/17 0622 09/03/17 0259 09/04/17 0232  NA  --   --  139 141 143  K  --   --  3.7 3.6 4.0  CL  --   --  103 110  111  CO2  --   --  24 25 27   GLUCOSE  --   --  225* 165* 134*  BUN  --   --  23* 23* 20  CREATININE  --    < > 1.17 1.12 0.90  CALCIUM  --   --  8.8* 8.1* 8.4*  PROT 6.6  --   --   --   --   ALBUMIN 3.9  --   --   --   --   AST 25  --   --   --   --   ALT 24  --   --   --   --   ALKPHOS 57  --   --   --   --   BILITOT 0.7  --   --   --   --   GFRNONAA  --    < > 59* >60 >60  GFRAA  --    < > >60 >60 >60  ANIONGAP  --   --  12 6 5    < > = values in this interval not displayed.     Cardiac Enzymes Recent Labs  Lab 09/02/17 0809 09/02/17 1343 09/02/17 1907  TROPONINI 0.04* 0.17* 0.26*    Radiology    Dg Chest 2 View  Result Date: 08/31/2017 CLINICAL DATA:  Shortness of breath and chills EXAM: CHEST  2 VIEW COMPARISON:  July 16, 2017 and Dec 25, 2016 FINDINGS: There is scarring throughout portions of the right lung with chronic blunting of the right costophrenic angle. There is  also chronic blunting of the left costophrenic angle with scarring in the left base. There is no edema or consolidation. The heart size and pulmonary vascularity are within normal limits. No adenopathy. No evident bone lesions. IMPRESSION: Areas of scarring bilaterally, more on the right than on the left. Blunting of the costophrenic angles bilaterally, chronic. No edema or consolidation. Stable cardiac silhouette. Electronically Signed   By: Bretta Bang III M.D.   On: 08/31/2017 20:28   Ct Chest W Contrast  Result Date: 08/31/2017 CLINICAL DATA:  Acute onset of shortness of breath and chills. EXAM: CT CHEST WITH CONTRAST TECHNIQUE: Multidetector CT imaging of the chest was performed during intravenous contrast administration. CONTRAST:  ISOVUE-300 IOPAMIDOL (ISOVUE-300) INJECTION 61% COMPARISON:  Chest radiograph performed earlier today 7:53 p.m., and CT of the chest performed 06/12/2017 FINDINGS: Cardiovascular: The heart is normal in size. Scattered coronary artery calcifications are seen. There is mild focal aneurysmal dilatation of the aortic arch to 3.7 cm in diameter, with mild underlying calcification. The great vessels demonstrate scattered calcification but are otherwise grossly unremarkable. Mediastinum/Nodes: The mediastinum is otherwise unremarkable in appearance. No mediastinal lymphadenopathy is seen. No pericardial effusion is identified. The thyroid gland is not well characterized. No axillary lymphadenopathy is seen. Lungs/Pleura: Chronic scarring is noted at the lung apices, with associated calcification on the right. Additional nodular left-sided airspace opacities have improved from the prior study, reflecting improving chronic infection. Diffuse right-sided pleural calcifications are seen. No pleural effusion or pneumothorax is seen. Upper Abdomen: The visualized portions of the liver and spleen are unremarkable. Stones are seen dependently within the gallbladder. The visualized  portions of the pancreas, adrenal glands and kidneys are within normal limits. Musculoskeletal: No acute osseous abnormalities are identified. The visualized musculature is unremarkable in appearance. IMPRESSION: 1. Nodular opacities within the left lung have improved from the prior study, reflecting improving chronic infection. 2. Chronic biapical scarring,  with associated diffuse right-sided pleural calcification. 3. 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 4. Cholelithiasis.  Gallbladder otherwise unremarkable. Electronically Signed   By: Roanna Raider M.D.   On: 08/31/2017 22:12   Dg Chest Port 1 View  Result Date: 09/02/2017 CLINICAL DATA:  Shortness of breath, history CHF, cardiomyopathy, pulmonary fibrosis, coronary artery disease, COPD, type II diabetes mellitus, hypertension EXAM: PORTABLE CHEST 1 VIEW COMPARISON:  Portable exam 1318 hours compared to 08/31/2017 FINDINGS: Normal heart size, mediastinal contours, and pulmonary vascularity. Pleuroparenchymal scarring at RIGHT apex and at lateral RIGHT base unchanged. Emphysematous and chronic interstitial changes, stable. No acute infiltrate, pleural effusion or pneumothorax. Bones demineralized. IMPRESSION: Changes of COPD and chronic interstitial disease/fibrosis. No acute abnormalities. Electronically Signed   By: Ulyses Southward M.D.   On: 09/02/2017 13:51     Cardiac Studies   ECHO: 09/02/2017 - Left ventricle: The cavity size was normal. Wall thickness was   increased in a pattern of moderate LVH. Systolic function was   mildly to moderately reduced. The estimated ejection fraction was   in the range of 40% to 45%. Features are consistent with a   pseudonormal left ventricular filling pattern, with concomitant   abnormal relaxation and increased filling pressure (grade 2   diastolic dysfunction). - Right atrium: The atrium was mildly dilated.   Patient Profile       77 y.o. 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 Cards saw 02/06 for new onset afib  Assessment & Plan    1. Rapid atrial fib - spont conversion to SR on amio, IV Cardizem d/c'd - bisoprolol 5 mg qd changed to Coreg 6.25 mg bid, got it yesterday pm and this am - no sx w/ sinus brady, hold for HR < 55 - amio 200 mg tid, continue this for 2 weeks, then 200 mg qd - only mild R atrial dilatation, hopefully will maintain SR - f/u as outpt  2. Anticoag - CHA2DS2-VASc= 6 (age x 2, HTN, DM, CAD, CHF) - platelets were normal 05/2017, came in at 129 and are now 71 - heparin>> Eliquis 02/07, plt trending up  3. Chronic combined systolic and diastolic CHF - EF unchanged on echo 02/06 - no sig volume overload on CXR or CT chest - watch carefully for sx, I/O and weights are not accurate - Continue spiro, ARB, BB   4. CAD - no ischemic sx - ok to stop Plavix now since anticoag needed, continue ASA, BB, statin  Otherwise, per IM, cards will see again prn, message sent for f/u appt Principal Problem:   Sepsis secondary to UTI [facility]) Active Problems:   Hypothyroidism   Essential hypertension   Coronary atherosclerosis   OSA on CPAP   Hyperlipidemia   Sepsis (HCC)   Diabetes mellitus with complication, with long-term current use of insulin (HCC)   Leukocytosis   Anxiety and depression   COPD (chronic obstructive pulmonary disease) (HCC)   SOB (shortness of breath)   Ischemic cardiomyopathy   Chronic systolic CHF (congestive heart failure) (HCC)   Aortic aneurysm (HCC)   Acute cystitis without hematuria   New onset atrial fibrillation (HCC)    Signed, Theodore Demark , PA-C 7:52 AM 09/04/2017 Pager: [phone]  Patient examined chart reviewed Converted to NSR doing much better exam With clear lungs no  murmur not volume overloaded. Hopefully home in am And would d/c  on amiodarone 200 bid for 2 weeks then 200 mg daily until F/u with Dr Mollie Germany

## 2017-09-04 NOTE — Progress Notes (Signed)
Order obtained by RN for CPAP per pts. home regimen, using own Med./Full Face mask with 3 lpm oxygen added to circuit, humidifier filled with SW, tolerating well.

## 2017-09-04 NOTE — Progress Notes (Signed)
PROGRESS NOTE    [name]  RAJ:[id number] DOB: [birth year] DOA: 08/31/2017 PCP: Sharlene Dory, DO    Brief Narrative:  Patient is a 77 year old gentleman history of hypertension, hypothyroidism, dyslipidemia, diabetes, pulmonary fibrosis, COPD, ischemic cardiomyopathy, chronic systolic heart failure, aortic aneurysm, presenting to the ED with fever chills dysuria, increasing shortness of breath at rest and on exertion requiring increased O2 from his baseline of 2 L to 3 L.  Patient pancultured placed empirically on IV vancomycin and IV Zosyn.  CT chest which was done showed nodular opacities within the left lung improved from prior study, chronic biapical scarring, associated diffuse right-sided pleural calcification. On hospital day 2 on 09/02/2017 at 6:45 AM patient was noted to be tachycardic concerns for probable A. fib heart rate sustaining in the 130s. Patient seen by cardiology transferred to the stepdown unit and placed on amiodarone and Cardizem drip.  Patient subsequently spontaneously converted to normal sinus rhythm on 09/03/2017.   Assessment & Plan:   Principal Problem:   Sepsis secondary to UTI [facility] Active Problems:   New onset atrial fibrillation (HCC)   SOB (shortness of breath)   Hypothyroidism   Essential hypertension   Coronary atherosclerosis   OSA on CPAP   Hyperlipidemia   Sepsis (HCC)   Diabetes mellitus with complication, with long-term current use of insulin (HCC)   Leukocytosis   Anxiety and depression   COPD (chronic obstructive pulmonary disease) (HCC)   Ischemic cardiomyopathy   Chronic systolic CHF (congestive heart failure) (HCC)   Aortic aneurysm (HCC)   Acute cystitis without hematuria  #1 sepsis felt secondary to Enterococcus faecalis UTI Patient presented with fevers, chills, dysuria urinalysis consistent with a UTI.  On admission patient noted to have a fever with temperatures of 101, O2 sats of 90% on 3 L nasal cannula, elevated  lactic acid level, with a leukocytosis.  Patient pancultured with results pending.  Repeat lactic acid level trended down.  Urine cultures with 50,000 colonies of Enterococcus faecalis which was sensitive to the penicillins, fluoroquinolones, vancomycin and Macrobid.  Discontinued IV Zosyn and placed empirically on IV ampicillin.  Blood cultures continue to remain negative for 3 days now and as such we will discontinue IV vancomycin.  Discontinue IV ampicillin and start on oral Augmentin.  Follow.  2.  New onset A. Fib CHA2DS2VASC = 6  Patient noted to be tachycardic at 6:45 AM the morning of 09/02/2017, and noted to be going in and out of A. fib per telemetry telemetry monitor.  Patient with a chronic left bundle branch block.  Heart rate in the 130s on 09/02/2017 and subsequently converted back to NSR on 09/03/2017.  Patient currently asymptomatic with clinical improvement. Cardiac enzymes minimally elevated. TSH low and synthroid adjusted.  Chest x-ray without any infiltrate.  2D echo done with EF of 40-45%, grade 2 diastolic dysfunction, moderate LVH.  Patient seen in consultation by cardiology and initially placed on amiodarone and Cardizem drip.  Cardizem drip discontinued and patient on IV amiodarone after conversion to normal sinus rhythm and bradycardia. Amiodarone been changed to oral.  Patient's bisoprolol was changed to oral Coreg per cardiology. No need for cardioversion at this time.  Patient started on Eliquis per cardiology. Platelet count stable.  Will likely need outpatient follow-up with cardiology.  3.  COPD Stable.  Continue Spiriva and breo ellipta.  Nebs as needed.  4.  Hypothyroidism TSH 0.040.  Synthroid dose was decreased to 188 MCG's daily.  Patient will need repeat  thyroid function studies done in about 4-6 weeks.  Outpatient follow-up.  5.  Diabetes mellitus II Last hemoglobin A1c was 9.5 ON 10/02/2016.  Hemoglobin A1c is 9.3 on 09/03/2017.  CBGs ranging from 138-289.  Continue Lantus.   Sliding scale insulin.  Discontinued semaglutide. follow.  6.  Ischemic cardiomyopathy/chronic systolic heart failure Due to problem #2 cardiac enzymes were cycled which were minimally elevated.  TSH which was done was decreased and as such patient's home dose Synthroid adjusted.  Patient does not look volume overloaded on examination and clinically compensated..  Patient's bisoprolol has been changed to Coreg per cardiology.  2D echo obtained had a EF of 40-45%, grade 2 diastolic dysfunction, moderate LVH, mildly dilated right atrium.  Continue aspirin, Lipitor, Cozaar, spironolactone.  Per cardiology okay to discontinue patient's Plavix as patient now being transitioned to anticoagulation.  Platelets stable.  Per cardiology.   7.  Enterococcus faecalis UTI Urine cultures with 50,000 colonies of Enterococcus faecalis.  IV Zosyn was changed to IV ampicillin.  Patient afebrile.  Improving clinically.  We will transition from IV ampicillin to oral Augmentin to complete course of antibiotic treatment.    8.  Shortness of breath Likely secondary to problem #2.  Clinically improved.  Patient feels shortness of breath is close to his baseline.  CT chest on admission with no significant acute infiltrate, nodular opacities within the left lung have improved from prior study reflecting improving chronic infection.  Mild focal aneurysmal dilatation of the aortic arch to 3.7 cm diameter.  Chest x-ray done 09/02/2017 with no acute infiltrate.  See problem #2.  9.  Hypertension Patient noted to have elevated blood pressures overnight.  Bisoprolol was changed to Coreg 6.25 mg twice daily per cardiology and dose adjusted with parameters to hold for heart rate less than 55.  Increase losartan to 75 mg daily for better blood pressure control.  Continue Aldactone.   10.  Obstructive sleep apnea CPAP nightly.  11.  Hyperlipidemia Fasting lipid panel with a LDL of 61.  Continue statin.  12.   Depression/anxiety Currently stable.  Continue Zoloft and Xanax.   13.  History of aortic aneurysm and history of thoracic aortic ductus diverticulum Outpatient follow-up for CTA in June 2019, per cardiologist note from 06/23/2017.     DVT prophylaxis: eliquis Code Status: Full Family Communication: Updated patient.  No family at bedside. Disposition Plan: Transfer to telemetry.  Likely home once medically stable and per cardiology in the next 24-48 hours.    Consultants:   Cardiology: Dr.Nishan 09/02/2017  Procedures:   CT chest 08/31/2017  Chest x-ray 08/31/2017, 09/02/2017  2D echo 09/02/2017  Antimicrobials:   IV Zosyn 08/31/2017>>>>> 09/03/2017  IV vancomycin 08/31/2017>>>>> 09/04/2017  IV ampicillin 2 09/03/2017>>>> 09/04/2017  Augmentin 09/04/2017   Subjective: Patient sitting up at the side of the bed.  Patient denies any worsening shortness of breath.  No chest pain.  Dizziness has resolved.  Patient states dysuria has improved significantly since admission.  Patient still in normal sinus rhythm.  Patient asking whether he can move about a little more independently today.    Objective: Vitals:   09/04/17 0257 09/04/17 0400 09/04/17 0750 09/04/17 0800  BP:  (!) 163/70  (!) 189/52  Pulse:    78  Resp:  (!) 28  17  Temp: 98 F (36.7 C)  (!) 97.3 F (36.3 C)   TempSrc: Axillary  Oral   SpO2:  96%  90%  Weight:      Height:  Intake/Output Summary (Last 24 hours) at 09/04/2017 0913 Last data filed at 09/04/2017 0626 Gross per 24 hour  Intake 3151.25 ml  Output 701 ml  Net 2450.25 ml   Filed Weights   08/31/17 1944 09/02/17 0603 09/03/17 0313  Weight: 95.7 kg (211 lb) 92.9 kg (204 lb 12.9 oz) 96.6 kg (212 lb 15.4 oz)    Examination:  General exam: Sitting up on side of the bed. Respiratory system: Lungs clear to auscultation bilaterally.  No crackles, no wheezing, no rhonchi.  Normal respiratory effort.  Cardiovascular system: Regular rate and rhythm no gallops  rubs or murmurs.  No JVD.  No lower extremity edema.  Gastrointestinal system: Abdomen is nontender, nondistended, soft, positive bowel sounds, no rebound, no guarding.   Central nervous system: Alert and oriented x3.  No focal neurological deficits.  Moving extremities spontaneously. Extremities: Symmetric 5 x 5 power. Skin: No rashes, lesions or ulcers Psychiatry: Judgement and insight appear normal. Mood & affect appropriate.     Data Reviewed: I have personally reviewed following labs and imaging studies  CBC: Recent Labs  Lab 08/31/17 1953 08/31/17 2005 09/01/17 1749 09/02/17 0622 09/03/17 0259 09/04/17 0232  WBC 10.8*  --  8.7 7.9 6.8 7.5  NEUTROABS 8.8*  --   --   --  3.8  --   HGB 11.7* 11.9* 11.5* 11.1* 10.3* 10.2*  HCT 35.7* 35.0* 35.2* 34.1* 32.5* 31.5*  MCV 84.6  --  83.4 85.0 85.5 85.4  PLT 129*  --  124* 103* 84* 94*   Basic Metabolic Panel: Recent Labs  Lab 08/31/17 2005 09/01/17 1749 09/02/17 0622 09/03/17 0259 09/04/17 0232  NA 135  --  139 141 143  K 4.5  --  3.7 3.6 4.0  CL 99*  --  103 110 111  CO2  --   --  24 25 27   GLUCOSE 244*  --  225* 165* 134*  BUN 20  --  23* 23* 20  CREATININE 1.10 0.90 1.17 1.12 0.90  CALCIUM  --   --  8.8* 8.1* 8.4*  MG  --   --  1.5* 2.1  --    GFR: Estimated Creatinine Clearance: 83.5 mL/min (by C-G formula based on SCr of 0.9 mg/dL). Liver Function Tests: Recent Labs  Lab 08/31/17 2055  AST 25  ALT 24  ALKPHOS 57  BILITOT 0.7  PROT 6.6  ALBUMIN 3.9   No results for input(s): LIPASE, AMYLASE in the last 168 hours. No results for input(s): AMMONIA in the last 168 hours. Coagulation Profile: Recent Labs  Lab 09/01/17 1749  INR 1.07   Cardiac Enzymes: Recent Labs  Lab 09/02/17 0809 09/02/17 1343 09/02/17 1907  TROPONINI 0.04* 0.17* 0.26*   BNP (last 3 results) Recent Labs    10/20/16 1038  PROBNP 188   HbA1C: Recent Labs    09/03/17 0259  HGBA1C 9.3*   CBG: Recent Labs  Lab  09/03/17 0807 09/03/17 1235 09/03/17 1650 09/03/17 2025 09/04/17 0749  GLUCAP 161* 257* 265* 289* 138*   Lipid Profile: Recent Labs    09/03/17 0259  CHOL 108  HDL 31*  LDLCALC 61  TRIG 79  CHOLHDL 3.5   Thyroid Function Tests: Recent Labs    09/02/17 0809  TSH 0.040*   Anemia Panel: No results for input(s): VITAMINB12, FOLATE, FERRITIN, TIBC, IRON, RETICCTPCT in the last 72 hours. Sepsis Labs: Recent Labs  Lab 08/31/17 2233 09/01/17 1749 09/01/17 2039 09/02/17 0809  PROCALCITON  --  <0.10  --   --  LATICACIDVEN 1.01 1.5 2.6* 2.1*    Recent Results (from the past 240 hour(s))  Blood Culture (routine x 2)     Status: None (Preliminary result)   Collection Time: 08/31/17  8:45 PM  Result Value Ref Range Status   Specimen Description   Final    BLOOD LEFT ANTECUBITAL Performed at [facility], [address] [place], [place] [address]    Special Requests   Final    BOTTLES DRAWN AEROBIC AND ANAEROBIC Blood Culture adequate volume Performed at [facility], [address] [place], [place] [address]    Culture   Final    NO GROWTH 3 DAYS Performed at [facility], [address], [place], [place] [address]    Report Status PENDING  Incomplete  Blood Culture (routine x 2)     Status: None (Preliminary result)   Collection Time: 08/31/17  9:00 PM  Result Value Ref Range Status   Specimen Description   Final    BLOOD RIGHT ANTECUBITAL Performed at [facility], [address] [place], [place] [address]    Special Requests   Final    BOTTLES DRAWN AEROBIC AND ANAEROBIC Blood Culture adequate volume Performed at [facility], [address] [place], [place] [address]    Culture   Final    NO GROWTH 3 DAYS Performed at [facility], [address] [place], [place] [address]    Report Status PENDING  Incomplete  Urine Culture     Status: Abnormal   Collection Time: 08/31/17  9:45 PM  Result Value  Ref Range Status   Specimen Description   Final    URINE, RANDOM Performed at [facility], [address] [place], [place] [address]    Special Requests   Final    NONE Performed at [facility], [address] [place], [place] [address]    Culture 50,000 COLONIES/mL ENTEROCOCCUS FAECALIS (A)  Final   Report Status 09/03/2017 FINAL  Final   Organism ID, Bacteria ENTEROCOCCUS FAECALIS (A)  Final      Susceptibility   Enterococcus faecalis - MIC*    AMPICILLIN <=2 SENSITIVE Sensitive     LEVOFLOXACIN 1 SENSITIVE Sensitive     NITROFURANTOIN <=16 SENSITIVE Sensitive     VANCOMYCIN 1 SENSITIVE Sensitive     * 50,000 COLONIES/mL ENTEROCOCCUS FAECALIS  MRSA PCR Screening     Status: None   Collection Time: 09/02/17 11:17 AM  Result Value Ref Range Status   MRSA by PCR NEGATIVE NEGATIVE Final    Comment:        The GeneXpert MRSA Assay (FDA approved for NASAL specimens only), is one component of a comprehensive MRSA colonization surveillance program. It is not intended to diagnose MRSA infection nor to guide or monitor treatment for MRSA infections. Performed at [facility], [address], [place], [place] [address]          Radiology Studies: Dg Chest Port 1 View  Result Date: 09/02/2017 CLINICAL DATA:  Shortness of breath, history CHF, cardiomyopathy, pulmonary fibrosis, coronary artery disease, COPD, type II diabetes mellitus, hypertension EXAM: PORTABLE CHEST 1 VIEW COMPARISON:  Portable exam 1318 hours compared to 08/31/2017 FINDINGS: Normal heart size, mediastinal contours, and pulmonary vascularity. Pleuroparenchymal scarring at RIGHT apex and at lateral RIGHT base unchanged. Emphysematous and chronic interstitial changes, stable. No acute infiltrate, pleural effusion or pneumothorax. Bones  demineralized. IMPRESSION: Changes of COPD and chronic interstitial disease/fibrosis. No acute abnormalities. Electronically Signed   By:  Ulyses Southward M.D.   On: 09/02/2017 13:51        Scheduled Meds: . ALPRAZolam  0.5 mg Oral BID  . amiodarone  200 mg Oral TID  . apixaban  5 mg Oral BID  . aspirin EC  81 mg Oral Daily  . atorvastatin  40 mg Oral Daily  . carvedilol  6.25 mg Oral BID WC  . cholecalciferol  2,000 Units Oral Daily  . insulin aspart  0-15 Units Subcutaneous TID WC  . insulin aspart  0-5 Units Subcutaneous QHS  . insulin aspart  3 Units Subcutaneous TID WC  . insulin glargine  40 Units Subcutaneous QHS  . levothyroxine  188 mcg Oral QAC breakfast  . losartan  75 mg Oral Daily  . pneumococcal 23 valent vaccine  0.5 mL Intramuscular Tomorrow-1000  . predniSONE  10 mg Oral Q breakfast  . sertraline  50 mg Oral Daily  . spironolactone  12.5 mg Oral Daily  . tamsulosin  0.8 mg Oral QHS  . tiotropium  18 mcg Inhalation Daily   Continuous Infusions: . ampicillin (OMNIPEN) IV Stopped (09/04/17 0729)     LOS: 3 days    Time spent: 40 minutes    Ramiro Harvest, MD [facility] Pager [phone]  If 7PM-7AM, please contact night-coverage [email or web address] Password [facility] 09/04/2017, 9:13 AM

## 2017-09-04 NOTE — Progress Notes (Signed)
Date: September 04, 2017 Marcelle Smiling, BSN, [place], [place] [phone] Chart and notes review for patient progress and needs./ resp status improving/ remains on o2 and iv abx., Will follow for case management and discharge needs. No cm or discharge needs present at time of this review. Next review date: [id number]

## 2017-09-04 NOTE — Plan of Care (Signed)
  Progressing Clinical Measurements: Respiratory complications will improve 09/04/2017 2159 - Progressing by Herbert Pun, RN Cardiovascular complication will be avoided 09/04/2017 2159 - Progressing by Herbert Pun, RN Activity: Risk for activity intolerance will decrease 09/04/2017 2159 - Progressing by Herbert Pun, RN

## 2017-09-04 NOTE — Progress Notes (Signed)
Patient awake and sitting up on side of bed, denies needs or wants. States " I just do not sleep well in the hospital.

## 2017-09-04 NOTE — Progress Notes (Signed)
Pt refusing CPAP at this time.  RT to monitor and assess as needed.  

## 2017-09-04 NOTE — Evaluation (Addendum)
Physical Therapy Evaluation Patient Details Name: [name] MRN: [id number] DOB: October 29, 1940 Today's Date: 09/04/2017   History of Present Illness  Patient is a 77 year old gentleman history of hypertension, hypothyroidism, dyslipidemia, diabetes, pulmonary fibrosis, COPD, ischemic cardiomyopathy, chronic systolic heart failure, aortic aneurysm, presenting to the ED with fever chills dysuria, increasing shortness of breath at rest and on exertion requiring increased O2 from his baseline of 2 L to 3 L.  Patient also with changes in CXR, and with new onset a-fib and converted on medication.   Clinical Impression  Patient presents with decreased independence with mobility due to decreased balance and endurance.  He will benefit from skilled PT in the acute setting to allow return home to independent level at d/c.  Likely not to need follow up PT.  BP after ambulation 206/88, RN aware.     Follow Up Recommendations No PT follow up    Equipment Recommendations  None recommended by PT    Recommendations for Other Services       Precautions / Restrictions Precautions Precautions: Fall      Mobility  Bed Mobility               General bed mobility comments: standing at bedside with NT in room upon my entry  Transfers Overall transfer level: Needs assistance Equipment used: Rolling walker (2 wheeled);None Transfers: Sit to/from Stand Sit to Stand: Supervision         General transfer comment: due to multiple lines  Ambulation/Gait Ambulation/Gait assistance: Supervision Ambulation Distance (Feet): 200 Feet(& 50') Assistive device: Rolling walker (2 wheeled);None Gait Pattern/deviations: Step-through pattern;Decreased stride length     General Gait Details: used walker with S for lines for long walk, then short distance with no device LOb on turns and stepping over boxes with leaning to touch my side for support  Stairs            Wheelchair Mobility     Modified Rankin (Stroke Patients Only)       Balance Overall balance assessment: Needs assistance   Sitting balance-Leahy Scale: Good       Standing balance-Leahy Scale: Fair Standing balance comment: standing in room no device                             Pertinent Vitals/Pain Pain Assessment: No/denies pain    Home Living Family/patient expects to be discharged to:: Private residence Living Arrangements: Spouse/significant other(on Hospice due to end stage COPD)   Type of Home: House(townhome) Home Access: Level entry     Home Layout: One level Home Equipment: Cane - single point;Grab bars - tub/shower;Other (comment) Additional Comments: uses cane at times and has home oxygen    Prior Function Level of Independence: Independent;Independent with assistive device(s)         Comments: mows his small lawn with push mower     Hand Dominance        Extremity/Trunk Assessment   Upper Extremity Assessment Upper Extremity Assessment: Overall WFL for tasks assessed    Lower Extremity Assessment Lower Extremity Assessment: Overall WFL for tasks assessed       Communication   Communication: No difficulties  Cognition Arousal/Alertness: Awake/alert Behavior During Therapy: Impulsive;WFL for tasks assessed/performed Overall Cognitive Status: Within Functional Limits for tasks assessed  General Comments: eager for up and OOB      General Comments General comments (skin integrity, edema, etc.): Educated on fall precautions for home and pt reports already in place; discussed walking more in hallway with nursing staff.    Exercises     Assessment/Plan    PT Assessment Patient needs continued PT services  PT Problem List Decreased balance;Decreased mobility;Decreased knowledge of use of DME;Decreased safety awareness;Cardiopulmonary status limiting activity       PT Treatment Interventions DME  instruction;Functional mobility training;Balance training;Patient/family education;Gait training;Therapeutic exercise;Therapeutic activities    PT Goals (Current goals can be found in the Care Plan section)  Acute Rehab PT Goals Patient Stated Goal: To return to independent/going to the gym PT Goal Formulation: With patient Time For Goal Achievement: 09/11/17 Potential to Achieve Goals: Good    Frequency Min 3X/week   Barriers to discharge        Co-evaluation               AM-PAC PT "6 Clicks" Daily Activity  Outcome Measure Difficulty turning over in bed (including adjusting bedclothes, sheets and blankets)?: None Difficulty moving from lying on back to sitting on the side of the bed? : A Little Difficulty sitting down on and standing up from a chair with arms (e.g., wheelchair, bedside commode, etc,.)?: A Little Help needed moving to and from a bed to chair (including a wheelchair)?: A Little Help needed walking in hospital room?: A Little Help needed climbing 3-5 steps with a railing? : A Little 6 Click Score: 19    End of Session Equipment Utilized During Treatment: Oxygen Activity Tolerance: Patient tolerated treatment well Patient left: in bed;with call bell/phone within reach(sitting EOB) Nurse Communication: Mobility status PT Visit Diagnosis: Other abnormalities of gait and mobility (R26.89)    Time: 8416-6063 PT Time Calculation (min) (ACUTE ONLY): 23 min   Charges:   PT Evaluation $PT Eval Low Complexity: 1 Low PT Treatments $Gait Training: 8-22 mins   PT G CodesSheran Lawless, [place] [phone] 09/04/2017   Elray Mcgregor 09/04/2017, 5:09 PM

## 2017-09-05 DIAGNOSIS — E039 Hypothyroidism, unspecified: Secondary | ICD-10-CM

## 2017-09-05 LAB — BASIC METABOLIC PANEL
ANION GAP: 6 (ref 5–15)
BUN: 18 mg/dL (ref 6–20)
CHLORIDE: 111 mmol/L (ref 101–111)
CO2: 23 mmol/L (ref 22–32)
Calcium: 8.2 mg/dL — ABNORMAL LOW (ref 8.9–10.3)
Creatinine, Ser: 0.97 mg/dL (ref 0.61–1.24)
GFR calc Af Amer: >60 mL/min (ref >60)
GLUCOSE: 160 mg/dL — AB (ref 65–99)
POTASSIUM: 4.2 mmol/L (ref 3.5–5.1)
Sodium: 140 mmol/L (ref 135–145)

## 2017-09-05 LAB — CBC
HEMATOCRIT: 37.6 % — AB (ref 39.0–52.0)
HEMOGLOBIN: 12.3 g/dL — AB (ref 13.0–17.0)
MCH: 27.5 pg (ref 26.0–34.0)
MCHC: 32.7 g/dL (ref 30.0–36.0)
MCV: 83.9 fL (ref 78.0–100.0)
PLATELETS: 95 10*3/uL — AB (ref 150–400)
RBC: 4.48 MIL/uL (ref 4.22–5.81)
RDW: 14.9 % (ref 11.5–15.5)
WBC: 7.2 10*3/uL (ref 4.0–10.5)

## 2017-09-05 LAB — C DIFFICILE QUICK SCREEN W PCR REFLEX
C DIFFICILE (CDIFF) INTERP: NOT DETECTED
C DIFFICLE (CDIFF) ANTIGEN: NEGATIVE
C Diff toxin: NEGATIVE

## 2017-09-05 LAB — GLUCOSE, CAPILLARY: GLUCOSE-CAPILLARY: 148 mg/dL — AB (ref 65–99)

## 2017-09-05 LAB — CULTURE, BLOOD (ROUTINE X 2)
CULTURE: NO GROWTH
Culture: NO GROWTH
SPECIAL REQUESTS: ADEQUATE
Special Requests: ADEQUATE

## 2017-09-05 MED ORDER — CARVEDILOL 6.25 MG PO TABS: 6.2500 mg | ORAL_TABLET | Freq: Two times a day (BID) | ORAL | 0 refills | Status: DC

## 2017-09-05 MED ORDER — AMOXICILLIN-POT CLAVULANATE 875-125 MG PO TABS: 1.0000 | ORAL_TABLET | Freq: Two times a day (BID) | ORAL | 0 refills | Status: AC

## 2017-09-05 MED ORDER — INSULIN GLARGINE 100 UNIT/ML ~~LOC~~ SOLN: 40.0000 [IU] | Freq: Every day | SUBCUTANEOUS | 0 refills | Status: DC

## 2017-09-05 MED ORDER — AMIODARONE HCL 200 MG PO TABS: 200.0000 mg | ORAL_TABLET | Freq: Two times a day (BID) | ORAL | 0 refills | Status: DC

## 2017-09-05 MED ORDER — LOSARTAN POTASSIUM 100 MG PO TABS: 100.0000 mg | ORAL_TABLET | Freq: Every day | ORAL | 0 refills | Status: DC

## 2017-09-05 MED ORDER — LEVOTHYROXINE SODIUM 125 MCG PO TABS: 188.0000 ug | ORAL_TABLET | Freq: Every day | ORAL | 0 refills | Status: DC

## 2017-09-05 MED ORDER — MECLIZINE HCL 25 MG PO TABS: 25.0000 mg | ORAL_TABLET | Freq: Three times a day (TID) | ORAL | 0 refills | Status: DC | PRN

## 2017-09-05 MED ORDER — LOSARTAN POTASSIUM 50 MG PO TABS
100.0000 mg | ORAL_TABLET | Freq: Every day | ORAL | Status: DC
  Administered 2017-09-05: 100 mg via ORAL
  Filled 2017-09-05: qty 2

## 2017-09-05 MED ORDER — APIXABAN 5 MG PO TABS: 5.0000 mg | ORAL_TABLET | Freq: Two times a day (BID) | ORAL | 1 refills | Status: DC

## 2017-09-05 NOTE — Evaluation (Signed)
Occupational Therapy Evaluation Patient Details Name: [name] MRN: [id number] DOB: Sep 07, 1940 Today's Date: 09/05/2017    History of Present Illness Patient is a 77 year old gentleman history of hypertension, hypothyroidism, dyslipidemia, diabetes, pulmonary fibrosis, COPD, ischemic cardiomyopathy, chronic systolic heart failure, aortic aneurysm, presenting to the ED with fever chills dysuria, increasing shortness of breath at rest and on exertion requiring increased O2 from his baseline of 2 L to 3 L.  Patient also with changes in CXR, and with new onset a-fib and converted on medication.    Clinical Impression   Pt was admitted for the above.  He is usually independent to mod I for adls/iadls and helps his wife who is under hospice care.  Pt is mostly mod I to occasional set up assistance.  Reviewed energy conservation and noted that he is good with pacing himself.  Encouraged him to consider sitting on his wife's shower chair initially and accepting assistance from family/friends as offered until he returns to his usual strength/endurance.  No further OT is needed at this time    Follow Up Recommendations  Supervision - Intermittent    Equipment Recommendations  None recommended by OT    Recommendations for Other Services       Precautions / Restrictions Precautions Precautions: Fall Restrictions Weight Bearing Restrictions: No      Mobility Bed Mobility               General bed mobility comments: oob  Transfers   Equipment used: None   Sit to Stand: Modified independent (Device/Increase time)         General transfer comment: extra time.  Pt only had to watch 02 line and no cues were needed    Balance                                           ADL either performed or assessed with clinical judgement   ADL Overall ADL's : Needs assistance/impaired                                       General ADL Comments: Pt  was standing at sink when I arrived. He sat on bed and initiated a rest break.  When I returned after printing energy conservation handouts, he did the same thing.  He is mostly set up to mod I for adls.  He takes his time and is able to complete them.  Dyspnea 2/4 during session.  Reviewed energy conservation with him.  He demonstrates that he is good with rest breaks and pacing himself; still, he is going home alone with his wife, and she is under hospice care.  He does IADLs.  He states that son-in-law works out by him and checks on them daily.  Wife has a shower seat, and I encouraged him to use it for energy conservation, while he recovers his strength.  He wasn't keen on this idea.       Vision         Perception     Praxis      Pertinent Vitals/Pain Pain Assessment: No/denies pain     Hand Dominance     Extremity/Trunk Assessment Upper Extremity Assessment Upper Extremity Assessment: Overall WFL for tasks assessed  Communication Communication Communication: No difficulties   Cognition Arousal/Alertness: Awake/alert Behavior During Therapy: WFL for tasks assessed/performed Overall Cognitive Status: Within Functional Limits for tasks assessed                                     General Comments       Exercises     Shoulder Instructions      Home Living Family/patient expects to be discharged to:: Private residence Living Arrangements: Spouse/significant other   Type of Home: House             Bathroom Shower/Tub: Engineer, civil (consulting) Toilet: Handicapped height     Home Equipment: Cane - single point;Grab bars - tub/shower;Other (comment)   Additional Comments: uses cane intermittently, 02 24/7.  Wife has a shower seat in tub. She is under hospice care and pt helps out.  He does IADLs      Prior Functioning/Environment Level of Independence: Independent;Independent with assistive device(s)                 OT  Problem List:        OT Treatment/Interventions:      OT Goals(Current goals can be found in the care plan section) Acute Rehab OT Goals Patient Stated Goal: To return to independent/going to the gym OT Goal Formulation: All assessment and education complete, DC therapy  OT Frequency:     Barriers to D/C:            Co-evaluation              AM-PAC PT "6 Clicks" Daily Activity     Outcome Measure Help from another person eating meals?: None Help from another person taking care of personal grooming?: None Help from another person toileting, which includes using toliet, bedpan, or urinal?: None Help from another person bathing (including washing, rinsing, drying)?: None Help from another person to put on and taking off regular upper body clothing?: None Help from another person to put on and taking off regular lower body clothing?: A Little 6 Click Score: 23   End of Session    Activity Tolerance: Patient tolerated treatment well Patient left: in bed(EOB)  OT Visit Diagnosis: Muscle weakness (generalized) (M62.81)                Time: 9381-0175 OT Time Calculation (min): 16 min Charges:  OT General Charges $OT Visit: 1 Visit OT Evaluation $OT Eval Low Complexity: 1 Low G-Codes:     [place], OTR/L [phone] 09/05/2017  Cherith Tewell 09/05/2017, 12:28 PM

## 2017-09-05 NOTE — Discharge Summary (Signed)
Physician Discharge Summary  LAREN WHALING LFY:[id number] DOB: [birth year] DOA: 08/31/2017  PCP: Sharlene Dory, DO  Admit date: 08/31/2017 Discharge date: 09/05/2017  Time spent: 65 minutes  Recommendations for Outpatient Follow-up:  1. Follow-up with Sharlene Dory, DO in 1-2 weeks.  On follow-up patient will need a basic metabolic profile done to follow-up on electrolytes and renal function.  Patient will also need a repeat thyroid function studies done in about 4-6 weeks as patient's Synthroid dose was decreased to 188 MCG's daily. 2. Follow-up with Dr. Jens Som, cardiology.  Follow-up for new onset atrial fibrillation.   Discharge Diagnoses:  Principal Problem:   Sepsis secondary to UTI [facility]) Active Problems:   New onset atrial fibrillation (HCC)   SOB (shortness of breath)   Hypothyroidism   Essential hypertension   Coronary atherosclerosis   OSA on CPAP   Hyperlipidemia   Sepsis (HCC)   Diabetes mellitus with complication, with long-term current use of insulin (HCC)   Leukocytosis   Anxiety and depression   COPD (chronic obstructive pulmonary disease) (HCC)   Ischemic cardiomyopathy   Chronic systolic CHF (congestive heart failure) (HCC)   Aortic aneurysm (HCC)   Acute cystitis without hematuria   Discharge Condition: Stable and improved  Diet recommendation: Heart healthy  Filed Weights   09/03/17 0313 09/04/17 1800 09/04/17 1841  Weight: 96.6 kg (212 lb 15.4 oz) 97.9 kg (215 lb 13.3 oz) 97.9 kg (215 lb 13.3 oz)    History of present illness:  Per Dr.Devine GREYCEN FELTER is a 77 y.o. male with medical history significant for anxiety and depression, rheumatoid arthritis, dyslipidemia, hypertension, hypothyroidism, DM (on metformin and Lantus), pulmonary fibrosis, OSA on CPAP, COPD who presented to [facility] with fevers, chills and burning sensation on urination. Pt also reported increasing shortness of breath at rest and with exertion and is  usually on 2 L [place] oxygen support but had required 3 L [place] oxygen support for shortness of breath. No cough. No chest pain. Pt did endorse weakness. No lightheadedness or loss of consciousness. No reports of abdominal pain, nausea or vomiting. No blood in stool or urine.   ED Course: In ED, BP was 107/90, HR 92, RR 15-28, T max 101F and oxygen saturation 90% on 3 L [place] oxygen support. UA showed large leukocytes. CT chest showed nodular opacities within the left lung improved from the prior study, reflecting improving chronic infection. Chronic biapical scarring, with associated diffuse right-sided pleural calcification.   Pt started on empiric abx while awaiting culture results    Hospital Course:  #1 sepsis felt secondary to Enterococcus faecalis UTI Patient presented with fevers, chills, dysuria urinalysis consistent with a UTI.  On admission patient noted to have a fever with temperatures of 101, O2 sats of 90% on 3 L nasal cannula, elevated lactic acid level, with a leukocytosis.  Patient pancultured with results pending.  Repeat lactic acid level trended down.  Urine cultures with 50,000 colonies of Enterococcus faecalis which was sensitive to the penicillins, fluoroquinolones, vancomycin and Macrobid.  Discontinued IV Zosyn and placed empirically on IV ampicillin.  Blood cultures continued to remain negative for 3 days and as such IV vancomycin was discontinued.  IV ampicillin was subsequently transitioned to oral Augmentin and patient be discharged on 3 more days of oral Augmentin to complete a course of antibiotic treatment.  2.  New onset A. Fib CHA2DS2VASC = 6  Patient noted to be tachycardic at 6:45 AM the morning of 09/02/2017, and noted  to be going in and out of A. fib per telemetry telemetry monitor.  Patient with a chronic left bundle branch block.  Heart rate in the 130s on 09/02/2017 and subsequently converted back to NSR on 09/03/2017.  Patient initially was symptomatic with dizziness,  shortness of breath.  As patient's A. fib improved and he converted to normal sinus rhythm patient's arrhythmia clinically improved.  Patient remained asymptomatic and improved on a daily basis.  Cardiac enzymes minimally elevated. TSH low and synthroid adjusted.  Chest x-ray without any infiltrate.  2D echo done with EF of 40-45%, grade 2 diastolic dysfunction, moderate LVH.  Patient seen in consultation by cardiology and initially placed on amiodarone and Cardizem drip.  Cardizem drip discontinued and patient on IV amiodarone after conversion to normal sinus rhythm and bradycardia. Amiodarone been changed to oral.  Patient's bisoprolol was changed to oral Coreg per cardiology. No need for cardioversion at this time.  Patient started on Eliquis per cardiology. Platelet count remained stable.  As recommended per cardiology that patient remain on amiodarone 200 mg twice daily for the next 2 weeks and then 200 mg daily until patient is seen by Dr. Jens Som cardiology.   3.  COPD Stable.    Maintained on home regimen of Spiriva and breo ellipta.  Nebs as needed.  4.  Hypothyroidism TSH 0.040.  Synthroid dose was decreased to 188 MCG's daily.  Patient will need repeat thyroid function studies done in about 4-6 weeks.  Outpatient follow-up.  5.  Diabetes mellitus II Last hemoglobin A1c was 9.5 ON 10/02/2016.  Hemoglobin A1c is 9.3 on 09/03/2017.  Patient was placed on Lantus at a decreased dose of 40 units daily from his home regimen as well as sliding scale insulin.  Oral hypoglycemic agents were held.  Patient's blood glucose levels remain controlled throughout the hospitalization.  Outpatient follow-up.  6.  Ischemic cardiomyopathy/chronic systolic heart failure Due to problem #2 cardiac enzymes were cycled which were minimally elevated.  TSH which was done was decreased and as such patient's home dose Synthroid adjusted.  Patient did not look volume overloaded on examination and clinically compensated..   Patient's bisoprolol was changed to Coreg per cardiology.  2D echo obtained had a EF of 40-45%, grade 2 diastolic dysfunction, moderate LVH, mildly dilated right atrium.    Patient was maintained on aspirin, Lipitor, Cozaar, spironolactone.  Per cardiology okay to discontinue patient's Plavix as patient now being transitioned to anticoagulation.  Platelets remained stable.  Per cardiology.   7.  Enterococcus faecalis UTI Patient was admitted with sepsis felt to be secondary to UTI.  Patient initially empirically placed on IV Zosyn.  Urine cultures with 50,000 colonies of Enterococcus faecalis.  IV Zosyn was changed to IV ampicillin.  Patient remained afebrile and improved on a clinical basis.  Patient was subsequently transitioned from IV ampicillin to oral Augmentin.  Patient will be discharged on 3 more days of oral Augmentin to complete a course of antibiotic treatment.  8.  Shortness of breath Likely secondary to problem #2.  Clinically improved during the hospitalization after patient spontaneously converted to normal sinus rhythm..  Patient feels shortness of breath is close to his baseline.  CT chest on admission with no significant acute infiltrate, nodular opacities within the left lung have improved from prior study reflecting improving chronic infection.  Mild focal aneurysmal dilatation of the aortic arch to 3.7 cm diameter.  Chest x-ray done 09/02/2017 with no acute infiltrate.  See problem #2.  9.  Hypertension Patient noted to have elevated blood pressures during the hospitalization after his A. fib spontaneously converted back to normal sinus rhythm.  Bisoprolol was changed to Coreg 6.25 mg twice daily per cardiology and dose adjusted with parameters to hold for heart rate less than 55.   Patient's losartan 200 mg daily for better blood pressure control.  Patient was continued on Aldactone.  Outpatient follow-up with his cardiologist.   10.  Obstructive sleep apnea CPAP nightly.  11.   Hyperlipidemia Fasting lipid panel with a LDL of 61.  Maintained on home regimen of statin.   12.  Depression/anxiety Stable.  Continued on home regimen of Zoloft and Xanax.  Outpatient follow-up.    13.  History of aortic aneurysm and history of thoracic aortic ductus diverticulum Outpatient follow-up for CTA in June 2019, per cardiologist note from 06/23/2017.      Procedures:  CT chest 08/31/2017  Chest x-ray 08/31/2017, 09/02/2017  2D echo 09/02/2017      Consultations:  Cardiology: Dr.Nishan 09/02/2017      Discharge Exam: Vitals:   09/05/17 0545 09/05/17 0841  BP: (!) 150/56   Pulse: 98   Resp: (!) 24   Temp: 98.9 F (37.2 C)   SpO2: 94% 95%    General: NAD Cardiovascular: RRR Respiratory: CTAB  Discharge Instructions   Discharge Instructions    Diet - low sodium heart healthy   Complete by:  As directed    Increase activity slowly   Complete by:  As directed      Allergies as of 09/05/2017   No Known Allergies     Medication List    STOP taking these medications   bisoprolol 5 MG tablet Commonly known as:  ZEBETA   clopidogrel 75 MG tablet Commonly known as:  PLAVIX   levofloxacin 500 MG tablet Commonly known as:  LEVAQUIN     TAKE these medications   acetaminophen 325 MG tablet Commonly known as:  TYLENOL Take 650 mg by mouth daily as needed for moderate pain or headache.   albuterol (2.5 MG/3ML) 0.083% nebulizer solution Commonly known as:  PROVENTIL 1 vial in nebulizer every 6 hours as needed Dx 496 What changed:    how much to take  how to take this  when to take this  reasons to take this  additional instructions  Another medication with the same name was removed. Continue taking this medication, and follow the directions you see here.   ALPRAZolam 0.5 MG tablet Commonly known as:  XANAX Take 0.5 mg by mouth 2 (two) times daily.   amiodarone 200 MG tablet Commonly known as:  PACERONE Take 1 tablet (200 mg  total) by mouth 2 (two) times daily. Take 1 tablet (200mg ) 2 times daily x 2 weeks, then 1 tablet (200mg ) daily.   amoxicillin-clavulanate 875-125 MG tablet Commonly known as:  AUGMENTIN Take 1 tablet by mouth every 12 (twelve) hours for 3 days.   apixaban 5 MG Tabs tablet Commonly known as:  ELIQUIS Take 1 tablet (5 mg total) by mouth 2 (two) times daily.   aspirin EC 81 MG tablet Take 1 tablet (81 mg total) by mouth daily.   atorvastatin 40 MG tablet Commonly known as:  LIPITOR Take 1 tablet (40 mg total) by mouth daily.   carvedilol 6.25 MG tablet Commonly known as:  COREG Take 1 tablet (6.25 mg total) by mouth 2 (two) times daily with a meal.   furosemide 40 MG tablet Commonly known as:  LASIX  TK 1 T PO D PRN FOR FLUID OR EDEMA   insulin glargine 100 UNIT/ML injection Commonly known as:  LANTUS Inject 0.4 mLs (40 Units total) into the skin at bedtime. What changed:  how much to take   levothyroxine 125 MCG tablet Commonly known as:  SYNTHROID, LEVOTHROID Take 1.5 tablets (188 mcg total) by mouth daily before breakfast. Start taking on:  09/06/2017 What changed:    medication strength  how much to take  Another medication with the same name was removed. Continue taking this medication, and follow the directions you see here.   losartan 100 MG tablet Commonly known as:  COZAAR Take 1 tablet (100 mg total) by mouth daily. Start taking on:  09/06/2017 What changed:    medication strength  how much to take   LUBRICATING EYE DROPS OP Apply 1 drop to eye daily as needed (dry eyes).   magnesium 84 MG ( ) Tbcr SR tablet Commonly known as:  MAGTAB Take 168 mg by mouth at bedtime.   meclizine 25 MG tablet Commonly known as:  ANTIVERT Take 1 tablet (25 mg total) by mouth 3 (three) times daily as needed for dizziness.   MEGA MULTIVITAMIN FOR MEN PO Take 1 tablet by mouth daily.   metFORMIN 750 MG 24 hr tablet Commonly known as:  GLUCOPHAGE-XR Take 750 mg by  mouth 2 (two) times daily.   ondansetron 4 MG tablet Commonly known as:  ZOFRAN Take 1 tablet (4 mg total) every 8 (eight) hours as needed by mouth for nausea or vomiting.   OXYGEN Inhale 2 L into the lungs continuous.   OZEMPIC 0.25 or 0.5 MG/DOSE Sopn Generic drug:  Semaglutide Inject 0.25-0.5 mg into the skin every 7 (seven) days.   predniSONE 10 MG tablet Commonly known as:  DELTASONE 2 tabs daily for 1 week, then 1 tab daily until seen back . What changed:    how much to take  how to take this  when to take this  additional instructions  Another medication with the same name was removed. Continue taking this medication, and follow the directions you see here.   pregabalin 50 MG capsule Commonly known as:  LYRICA Take 50 mg by mouth 2 (two) times daily.   sertraline 100 MG tablet Commonly known as:  ZOLOFT Take 50 mg by mouth daily.   spironolactone 25 MG tablet Commonly known as:  ALDACTONE Take 0.5 tablets (12.5 mg total) by mouth daily.   tamsulosin 0.4 MG Caps capsule Commonly known as:  FLOMAX Take 2 capsules (0.8 mg total) by mouth at bedtime.   tiotropium 18 MCG inhalation capsule Commonly known as:  SPIRIVA HANDIHALER Place 1 capsule (18 mcg total) into inhaler and inhale daily.   Vitamin D3 2000 units capsule Take 2,000 Units by mouth daily.      No Known Allergies Follow-up Information    Lewayne Bunting, MD Follow up.   Specialty:  Cardiology Why:  We will cancel echo appt, the office will call with f/u with MD. Contact information: [address] [place] [address] [phone]        Sharlene Dory, DO. Schedule an appointment as soon as possible for a visit in 2 week(s).   Specialty:  Family Medicine Contact information: [address] [place] [address] [phone]            The results of significant diagnostics from this hospitalization (including imaging,  microbiology, ancillary  and laboratory) are listed below for reference.    Significant Diagnostic Studies: Dg Chest 2 View  Result Date: 08/31/2017 CLINICAL DATA:  Shortness of breath and chills EXAM: CHEST  2 VIEW COMPARISON:  July 16, 2017 and Dec 25, 2016 FINDINGS: There is scarring throughout portions of the right lung with chronic blunting of the right costophrenic angle. There is also chronic blunting of the left costophrenic angle with scarring in the left base. There is no edema or consolidation. The heart size and pulmonary vascularity are within normal limits. No adenopathy. No evident bone lesions. IMPRESSION: Areas of scarring bilaterally, more on the right than on the left. Blunting of the costophrenic angles bilaterally, chronic. No edema or consolidation. Stable cardiac silhouette. Electronically Signed   By: Bretta Bang III M.D.   On: 08/31/2017 20:28   Ct Chest W Contrast  Result Date: 08/31/2017 CLINICAL DATA:  Acute onset of shortness of breath and chills. EXAM: CT CHEST WITH CONTRAST TECHNIQUE: Multidetector CT imaging of the chest was performed during intravenous contrast administration. CONTRAST:  ISOVUE-300 IOPAMIDOL (ISOVUE-300) INJECTION 61% COMPARISON:  Chest radiograph performed earlier today 7:53 p.m., and CT of the chest performed 06/12/2017 FINDINGS: Cardiovascular: The heart is normal in size. Scattered coronary artery calcifications are seen. There is mild focal aneurysmal dilatation of the aortic arch to 3.7 cm in diameter, with mild underlying calcification. The great vessels demonstrate scattered calcification but are otherwise grossly unremarkable. Mediastinum/Nodes: The mediastinum is otherwise unremarkable in appearance. No mediastinal lymphadenopathy is seen. No pericardial effusion is identified. The thyroid gland is not well characterized. No axillary lymphadenopathy is seen. Lungs/Pleura: Chronic scarring is noted at the lung apices, with associated  calcification on the right. Additional nodular left-sided airspace opacities have improved from the prior study, reflecting improving chronic infection. Diffuse right-sided pleural calcifications are seen. No pleural effusion or pneumothorax is seen. Upper Abdomen: The visualized portions of the liver and spleen are unremarkable. Stones are seen dependently within the gallbladder. The visualized portions of the pancreas, adrenal glands and kidneys are within normal limits. Musculoskeletal: No acute osseous abnormalities are identified. The visualized musculature is unremarkable in appearance. IMPRESSION: 1. Nodular opacities within the left lung have improved from the prior study, reflecting improving chronic infection. 2. Chronic biapical scarring, with associated diffuse right-sided pleural calcification. 3. 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 4. Cholelithiasis.  Gallbladder otherwise unremarkable. Electronically Signed   By: Roanna Raider M.D.   On: 08/31/2017 22:12   Dg Chest Port 1 View  Result Date: 09/02/2017 CLINICAL DATA:  Shortness of breath, history CHF, cardiomyopathy, pulmonary fibrosis, coronary artery disease, COPD, type II diabetes mellitus, hypertension EXAM: PORTABLE CHEST 1 VIEW COMPARISON:  Portable exam 1318 hours compared to 08/31/2017 FINDINGS: Normal heart size, mediastinal contours, and pulmonary vascularity. Pleuroparenchymal scarring at RIGHT apex and at lateral RIGHT base unchanged. Emphysematous and chronic interstitial changes, stable. No acute infiltrate, pleural effusion or pneumothorax. Bones demineralized. IMPRESSION: Changes of COPD and chronic interstitial disease/fibrosis. No acute abnormalities. Electronically Signed   By: Ulyses Southward M.D.   On: 09/02/2017 13:51    Microbiology: Recent Results (from the past 240 hour(s))  Blood Culture (routine x 2)     Status: None (Preliminary result)    Collection Time: 08/31/17  8:45 PM  Result Value Ref Range Status   Specimen Description   Final    BLOOD LEFT ANTECUBITAL Performed at [facility]  [address] [employer] [place], [place] [address]    Special Requests   Final    BOTTLES DRAWN AEROBIC AND ANAEROBIC Blood Culture adequate volume Performed at [facility], [address] [place], [place] [address]    Culture   Final    NO GROWTH 4 DAYS Performed at [facility], [address], [place], [place] [address]    Report Status PENDING  Incomplete  Blood Culture (routine x 2)     Status: None (Preliminary result)   Collection Time: 08/31/17  9:00 PM  Result Value Ref Range Status   Specimen Description   Final    BLOOD RIGHT ANTECUBITAL Performed at [facility], [address] [place], [place] [address]    Special Requests   Final    BOTTLES DRAWN AEROBIC AND ANAEROBIC Blood Culture adequate volume Performed at [facility], [address] [place], [place] [address]    Culture   Final    NO GROWTH 4 DAYS Performed at [facility], [address], [place], [place] [address]    Report Status PENDING  Incomplete  Urine Culture     Status: Abnormal   Collection Time: 08/31/17  9:45 PM  Result Value Ref Range Status   Specimen Description   Final    URINE, RANDOM Performed at [facility], [address] [place], [place] [address]    Special Requests   Final    NONE Performed at [facility], [address] [place], [place] [address]    Culture 50,000 COLONIES/mL ENTEROCOCCUS FAECALIS (A)  Final   Report Status 09/03/2017 FINAL  Final   Organism ID, Bacteria ENTEROCOCCUS FAECALIS (A)  Final      Susceptibility   Enterococcus faecalis - MIC*    AMPICILLIN <=2 SENSITIVE Sensitive     LEVOFLOXACIN 1 SENSITIVE Sensitive     NITROFURANTOIN <=16 SENSITIVE Sensitive     VANCOMYCIN 1 SENSITIVE Sensitive     * 50,000 COLONIES/mL ENTEROCOCCUS  FAECALIS  MRSA PCR Screening     Status: None   Collection Time: 09/02/17 11:17 AM  Result Value Ref Range Status   MRSA by PCR NEGATIVE NEGATIVE Final    Comment:        The GeneXpert MRSA Assay (FDA approved for NASAL specimens only), is one component of a comprehensive MRSA colonization surveillance program. It is not intended to diagnose MRSA infection nor to guide or monitor treatment for MRSA infections. Performed at [facility], [address], [place], [place] [address]   C difficile quick scan w PCR reflex     Status: None   Collection Time: 09/05/17  6:00 AM  Result Value Ref Range Status   C Diff antigen NEGATIVE NEGATIVE Final   C Diff toxin NEGATIVE NEGATIVE Final   C Diff interpretation No C. difficile detected.  Final    Comment: Performed at [facility], [address], [place], [place] [address]     Labs: Basic Metabolic Panel: Recent Labs  Lab 08/31/17 2005 09/01/17 1749 09/02/17 0622 09/03/17 0259 09/04/17 0232 09/05/17 0543  NA 135  --  139 141 143 140  K 4.5  --  3.7 3.6 4.0 4.2  CL 99*  --  103 110 111 111  CO2  --   --  24 25 27 23   GLUCOSE 244*  --  225* 165* 134* 160*  BUN 20  --  23* 23* 20 18  CREATININE 1.10 0.90 1.17 1.12 0.90 0.97  CALCIUM  --   --  8.8* 8.1* 8.4* 8.2*  MG  --   --  1.5* 2.1  --   --    Liver Function Tests: Recent Labs  Lab 08/31/17 2055  AST 25  ALT 24  ALKPHOS 57  BILITOT 0.7  PROT 6.6  ALBUMIN 3.9   No results for input(s): LIPASE, AMYLASE in the last 168 hours. No results for input(s): AMMONIA in the last 168 hours. CBC: Recent Labs  Lab 08/31/17 1953  09/01/17 1749 09/02/17 0622 09/03/17 0259 09/04/17 0232 09/05/17 0543  WBC 10.8*  --  8.7 7.9 6.8 7.5 7.2  NEUTROABS 8.8*  --   --   --  3.8  --   --   HGB 11.7*   < > 11.5* 11.1* 10.3* 10.2* 12.3*  HCT 35.7*   < > 35.2* 34.1* 32.5* 31.5* 37.6*  MCV 84.6  --  83.4 85.0 85.5 85.4 83.9  PLT 129*  --  124*  103* 84* 94* 95*   < > = values in this interval not displayed.   Cardiac Enzymes: Recent Labs  Lab 09/02/17 0809 09/02/17 1343 09/02/17 1907  TROPONINI 0.04* 0.17* 0.26*   BNP: BNP (last 3 results) No results for input(s): BNP in the last 8760 hours.  ProBNP (last 3 results) Recent Labs    10/20/16 1038  PROBNP 188    CBG: Recent Labs  Lab 09/04/17 0749 09/04/17 1224 09/04/17 1554 09/04/17 2106 09/05/17 0745  GLUCAP 138* 162* 189* 214* 148*       Signed:  Ramiro Harvest MD.  [facility] 09/05/2017, 9:46 AM

## 2017-09-05 NOTE — Progress Notes (Signed)
Progress Note  Patient Name: [name] Date of Encounter: 09/05/2017  Primary Cardiologist: Dr Jens Som   Subjective   No complaints going home today   Inpatient Medications    Scheduled Meds: . ALPRAZolam  0.5 mg Oral BID  . amiodarone  200 mg Oral TID  . amoxicillin-clavulanate  1 tablet Oral Q12H  . apixaban  5 mg Oral BID  . aspirin EC  81 mg Oral Daily  . atorvastatin  40 mg Oral Daily  . carvedilol  6.25 mg Oral BID WC  . cholecalciferol  2,000 Units Oral Daily  . insulin aspart  0-15 Units Subcutaneous TID WC  . insulin aspart  0-5 Units Subcutaneous QHS  . insulin aspart  3 Units Subcutaneous TID WC  . insulin glargine  40 Units Subcutaneous QHS  . levothyroxine  188 mcg Oral QAC breakfast  . losartan  100 mg Oral Daily  . pneumococcal 23 valent vaccine  0.5 mL Intramuscular Tomorrow-1000  . predniSONE  10 mg Oral Q breakfast  . sertraline  50 mg Oral Daily  . spironolactone  12.5 mg Oral Daily  . tamsulosin  0.8 mg Oral QHS  . tiotropium  18 mcg Inhalation Daily   Continuous Infusions:  PRN Meds: acetaminophen, albuterol, meclizine, ondansetron   Vital Signs    Vitals:   09/04/17 2108 09/05/17 0334 09/05/17 0545 09/05/17 0841  BP: (!) 172/78 (!) 146/108 (!) 150/56   Pulse: 70 88 98   Resp: 20 20 (!) 24   Temp: 97.7 F (36.5 C) 99 F (37.2 C) 98.9 F (37.2 C)   TempSrc: Oral Rectal Oral   SpO2: 97% 98% 94% 95%  Weight:      Height:        Intake/Output Summary (Last 24 hours) at 09/05/2017 0945 Last data filed at 09/05/2017 0600 Gross per 24 hour  Intake 720 ml  Output -  Net 720 ml   Filed Weights   09/03/17 0313 09/04/17 1800 09/04/17 1841  Weight: 212 lb 15.4 oz (96.6 kg) 215 lb 13.3 oz (97.9 kg) 215 lb 13.3 oz (97.9 kg)    Telemetry    SR, sinus brady 50s Since yesterday - Personally Reviewed  ECG    02/06 Atrial fib w/ RVR, LBBB, HR 134 - Personally Reviewed  Physical Exam   Affect appropriate Healthy:  appears  stated age HEENT: normal Neck supple with no adenopathy JVP normal no bruits no thyromegaly Lungs clear with no wheezing and good diaphragmatic motion Heart:  S1/S2 no murmur, no rub, gallop or click PMI normal Abdomen: benighn, BS positve, no tenderness, no AAA no bruit.  No HSM or HJR Distal pulses intact with no bruits No edema Neuro non-focal Skin warm and dry No muscular weakness  Telemetry:  09/05/2017 NSR / ST rates 80-100 no PaF    Labs    Hematology Recent Labs  Lab 09/03/17 0259 09/04/17 0232 09/05/17 0543  WBC 6.8 7.5 7.2  RBC 3.80* 3.69* 4.48  HGB 10.3* 10.2* 12.3*  HCT 32.5* 31.5* 37.6*  MCV 85.5 85.4 83.9  MCH 27.1 27.6 27.5  MCHC 31.7 32.4 32.7  RDW 14.9 15.0 14.9  PLT 84* 94* 95*    Chemistry Recent Labs  Lab 08/31/17 2055  09/03/17 0259 09/04/17 0232 09/05/17 0543  NA  --    < > 141 143 140  K  --    < > 3.6 4.0 4.2  CL  --    < > 110 111  111  CO2  --    < > 25 27 23   GLUCOSE  --    < > 165* 134* 160*  BUN  --    < > 23* 20 18  CREATININE  --    < > 1.12 0.90 0.97  CALCIUM  --    < > 8.1* 8.4* 8.2*  PROT 6.6  --   --   --   --   ALBUMIN 3.9  --   --   --   --   AST 25  --   --   --   --   ALT 24  --   --   --   --   ALKPHOS 57  --   --   --   --   BILITOT 0.7  --   --   --   --   GFRNONAA  --    < > >60 >60 >60  GFRAA  --    < > >60 >60 >60  ANIONGAP  --    < > 6 5 6    < > = values in this interval not displayed.     Cardiac Enzymes Recent Labs  Lab 09/02/17 0809 09/02/17 1343 09/02/17 1907  TROPONINI 0.04* 0.17* 0.26*    Radiology    Dg Chest Port 1 View  Result Date: 09/02/2017 CLINICAL DATA:  Shortness of breath, history CHF, cardiomyopathy, pulmonary fibrosis, coronary artery disease, COPD, type II diabetes mellitus, hypertension EXAM: PORTABLE CHEST 1 VIEW COMPARISON:  Portable exam 1318 hours compared to 08/31/2017 FINDINGS: Normal heart size, mediastinal contours, and pulmonary vascularity. Pleuroparenchymal scarring at  RIGHT apex and at lateral RIGHT base unchanged. Emphysematous and chronic interstitial changes, stable. No acute infiltrate, pleural effusion or pneumothorax. Bones demineralized. IMPRESSION: Changes of COPD and chronic interstitial disease/fibrosis. No acute abnormalities. Electronically Signed   By: 10/31/2017 M.D.   On: 09/02/2017 13:51     Cardiac Studies   ECHO: 09/02/2017 - Left ventricle: The cavity size was normal. Wall thickness was   increased in a pattern of moderate LVH. Systolic function was   mildly to moderately reduced. The estimated ejection fraction was   in the range of 40% to 45%. Features are consistent with a   pseudonormal left ventricular filling pattern, with concomitant   abnormal relaxation and increased filling pressure (grade 2   diastolic dysfunction). - Right atrium: The atrium was mildly dilated.   Patient Profile     77 y.o. 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 Cards saw 02/06 for new onset afib  Assessment & Plan    1. Rapid atrial fib Converted d/c on amiodarone 200 bid for 2 weeks then 200 mg daily Beta blocker changed to coreg continue eliquis for CHA2DS2-Vasc 6  2. Chronic combined systolic and diastolic CHF - EF unchanged on echo 02/06 - no sig volume overload on CXR or CT chest - ARB increased for BP stable   4. CAD - no ischemic sx - ok to stop Plavix now since anticoag needed, continue ASA, BB, statin  Ok to d/c home will arrange outpatient f/u with Dr 04/06

## 2017-09-07 ENCOUNTER — Telehealth: Payer: Self-pay

## 2017-09-07 NOTE — Telephone Encounter (Signed)
LM requesting call back to complete TCM and schedule hospital follow up.   

## 2017-09-08 ENCOUNTER — Encounter (HOSPITAL_BASED_OUTPATIENT_CLINIC_OR_DEPARTMENT_OTHER): Payer: Medicare Other

## 2017-09-08 NOTE — Telephone Encounter (Signed)
LM requesting call back to complete TCM and confirm hospital f/u appt.  

## 2017-09-10 NOTE — Telephone Encounter (Signed)
LM requesting CB to complete TCM and confirm hospital f/u appointment (09/14/17).

## 2017-09-14 ENCOUNTER — Encounter: Payer: Self-pay | Admitting: Family Medicine

## 2017-09-14 ENCOUNTER — Ambulatory Visit: Payer: Medicare Other | Admitting: Family Medicine

## 2017-09-14 VITALS — BP 118/68 | HR 72 | Temp 97.7°F | Ht 74.0 in | Wt 216.0 lb

## 2017-09-14 DIAGNOSIS — I480 Paroxysmal atrial fibrillation: Secondary | ICD-10-CM

## 2017-09-14 DIAGNOSIS — W19XXXA Unspecified fall, initial encounter: Secondary | ICD-10-CM

## 2017-09-14 DIAGNOSIS — I5022 Chronic systolic (congestive) heart failure: Secondary | ICD-10-CM

## 2017-09-14 DIAGNOSIS — I951 Orthostatic hypotension: Secondary | ICD-10-CM

## 2017-09-14 LAB — BASIC METABOLIC PANEL
BUN: 16 mg/dL (ref 6–23)
CALCIUM: 9.3 mg/dL (ref 8.4–10.5)
CHLORIDE: 102 meq/L (ref 96–112)
CO2: 31 meq/L (ref 19–32)
Creatinine, Ser: 1.36 mg/dL (ref 0.40–1.50)
GFR: 54.14 mL/min — ABNORMAL LOW (ref >60.00)
Glucose, Bld: 103 mg/dL — ABNORMAL HIGH (ref 70–99)
POTASSIUM: 5.7 meq/L — AB (ref 3.5–5.1)
SODIUM: 140 meq/L (ref 135–145)

## 2017-09-14 LAB — CBC
HEMATOCRIT: 33.9 % — AB (ref 39.0–52.0)
Hemoglobin: 11.1 g/dL — ABNORMAL LOW (ref 13.0–17.0)
MCHC: 32.7 g/dL (ref 30.0–36.0)
MCV: 84.9 fl (ref 78.0–100.0)
PLATELETS: 153 10*3/uL (ref 150.0–400.0)
RBC: 3.99 Mil/uL — AB (ref 4.22–5.81)
RDW: 16.3 % — ABNORMAL HIGH (ref 11.5–15.5)
WBC: 7.6 10*3/uL (ref 4.0–10.5)

## 2017-09-14 MED ORDER — CARVEDILOL 3.125 MG PO TABS: 3.1250 mg | ORAL_TABLET | Freq: Two times a day (BID) | ORAL | 3 refills | Status: DC

## 2017-09-14 MED FILL — CARVEDILOL 3.125 MG TABLET: 3.125 | 30 days supply | Qty: 60 | Fill #0 | Status: TO

## 2017-09-14 NOTE — Progress Notes (Addendum)
Chief Complaint  Patient presents with  . Hospitalization Follow-up    HPI [name] is a 77 y.o. y.o. male who presents for a transition of care visit.  Pt was discharged from [facility] on 09/05/17.  Within 48 business hours of discharge our office contacted him via telephone to coordinate his care and needs. Here w his daughter.  Pt inpt from 08/31/17-09/05/17 for new onset A fib and dyspnea. Improved after being placed on amiodarone and Eliquis, but over weekend started getting lightheaded after standing. He is on Coreg and Flomax (2 tabs at bed). His PO intake has been normal. He is an insulin dependent DM pt. Sugars have been unchanged over weekend and after he feels lightheaded. Feels like he stood up too fasting, nothing is spinning. He is tolerating is medicines well. While he does bruise easily, he has not noticed any blood or areas of easy bleeding elsewhere. His wife has noticed that he appears more pale.   Social History   Socioeconomic History  . Marital status: Married  Occupational History  . Occupation: disabled Cytogeneticist, Ex Neurosurgeon: RETIRED  Tobacco Use  . Smoking status: Former Smoker    Packs/day: 2.50    Years: 40.00    Pack years: 100.00    Types: Cigarettes, Pipe, Cigars    Last attempt to quit: 07/28/1998    Years since quitting: 19.1  . Smokeless tobacco: Never Used  Substance and Sexual Activity  . Alcohol use: No    Alcohol/week: 0.0 oz  . Drug use: No  Social History Narrative   Lives in the home with his wife   Sees [place] every 6 month for meds.   Denies asbestos exposure   Past Medical History:  Diagnosis Date  . Anxiety   . Aortic aneurysm (HCC) 09/02/2017  . BENIGN PROSTATIC HYPERTROPHY 11/23/2009  . Cardiomyopathy (HCC) 08/28/2016  . Chronic systolic CHF (congestive heart failure) (HCC) 09/02/2017  . COPD (chronic obstructive pulmonary disease) (HCC)   . CORONARY ARTERY DISEASE 11/23/2009  . 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  DEGENERATIVE JOINT DISEASE 11/23/2009  . DEPRESSION 11/23/2009  . FATIGUE 11/23/2009  . GAIT DISTURBANCE 12/10/2009  . HEMOPTYSIS UNSPECIFIED 05/07/2010  . High cholesterol   . HYPERTENSION 07/30/2009  . HYPOTHYROIDISM 07/30/2009  . Ischemic cardiomyopathy 09/02/2017  . 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 On home oxygen therapy    "2-3L; 24/7" (09/10/2016)  . OSA on CPAP   . PTSD (post-traumatic stress disorder) 03/10/2012  . 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 RA (rheumatoid arthritis) (HCC) 06/11/2011   "qwhere" (09/10/2016)  . RESPIRATORY FAILURE, CHRONIC 07/31/2009  . Scleritis of both eyes 03/17/2014  . 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 Type II diabetes mellitus (HCC)    Family History  Problem Relation Age of Onset  . Other Mother        gun shot   Allergies as of 09/14/2017   No Known Allergies     Medication List        Accurate as of 09/14/17 12:26 PM. Always use your most recent med list.          acetaminophen 325 MG tablet Commonly known as:  TYLENOL Take 650 mg by mouth daily as needed for moderate pain or headache.   albuterol (2.5 MG/3ML) 0.083% nebulizer solution Commonly known as:  PROVENTIL 1 vial in nebulizer every 6 hours as needed Dx 496   ALPRAZolam 0.5 MG tablet Commonly known as:  XANAX Take 0.5  mg by mouth 2 (two) times daily.   amiodarone 200 MG tablet Commonly known as:  PACERONE Take 1 tablet (200 mg total) by mouth 2 (two) times daily. Take 1 tablet (200mg ) 2 times daily x 2 weeks, then 1 tablet (200mg ) daily.   apixaban 5 MG Tabs tablet Commonly known as:  ELIQUIS Take 1 tablet (5 mg total) by mouth 2 (two) times daily.   aspirin EC 81 MG tablet Take 1 tablet (81 mg total) by mouth daily.   atorvastatin 40 MG tablet Commonly known as:  LIPITOR Take 1 tablet (40 mg total) by mouth daily.   carvedilol 3.125 MG tablet Commonly known as:  COREG Take 1 tablet (3.125 mg total) by mouth 2 (two) times daily with a meal.   FLOMAX 0.4 MG Caps  capsule Generic drug:  tamsulosin Take 1 capsule (0.4 mg total) by mouth at bedtime.   furosemide 40 MG tablet Commonly known as:  LASIX TK 1 T PO D PRN FOR FLUID OR EDEMA   insulin glargine 100 UNIT/ML injection Commonly known as:  LANTUS Inject 0.4 mLs (40 Units total) into the skin at bedtime.   levothyroxine 125 MCG tablet Commonly known as:  SYNTHROID, LEVOTHROID Take 1.5 tablets (188 mcg total) by mouth daily before breakfast.   losartan 100 MG tablet Commonly known as:  COZAAR Take 1 tablet (100 mg total) by mouth daily.   LUBRICATING EYE DROPS OP Apply 1 drop to eye daily as needed (dry eyes).   magnesium 84 MG ( ) Tbcr SR tablet Commonly known as:  MAGTAB Take 168 mg by mouth at bedtime.   meclizine 25 MG tablet Commonly known as:  ANTIVERT Take 1 tablet (25 mg total) by mouth 3 (three) times daily as needed for dizziness.   MEGA MULTIVITAMIN FOR MEN PO Take 1 tablet by mouth daily.   metFORMIN 750 MG 24 hr tablet Commonly known as:  GLUCOPHAGE-XR Take 750 mg by mouth 2 (two) times daily.   OXYGEN Inhale 2 L into the lungs continuous.   OZEMPIC 0.25 or 0.5 MG/DOSE Sopn Generic drug:  Semaglutide Inject 0.25-0.5 mg into the skin every 7 (seven) days.   predniSONE 10 MG tablet Commonly known as:  DELTASONE 2 tabs daily for 1 week, then 1 tab daily until seen back .   pregabalin 50 MG capsule Commonly known as:  LYRICA Take 50 mg by mouth 2 (two) times daily.   spironolactone 25 MG tablet Commonly known as:  ALDACTONE Take 0.5 tablets (12.5 mg total) by mouth daily.   tiotropium 18 MCG inhalation capsule Commonly known as:  SPIRIVA HANDIHALER Place 1 capsule (18 mcg total) into inhaler and inhale daily.   Vitamin D3 2000 units capsule Take 2,000 Units by mouth daily.       ROS:  Constitutional: No fevers or chills, no weight loss HEENT: No headaches, hearing loss, or runny nose, no sore throat Heart: No chest pain Lungs: +intermittent  SOB, no cough Abd: No bowel changes, no pain, no N/V GU: No urinary complaints Neuro: No numbness, tingling or weakness Msk: No joint or muscle pain  Objective BP 118/68 (BP Location: Left Arm, Patient Position: Sitting, Cuff Size: Normal)   Pulse 72   Temp 97.7 F (36.5 C) (Oral)   Ht 6\' 2"  (1.88 m)   Wt 216 lb (98 kg)   SpO2 91%   BMI 27.73 kg/m  General Appearance:  awake, alert, oriented, in no acute distress and well developed, well nourished Skin:  there are no suspicious lesions or rashes of concern Head/face:  NCAT Eyes:  EOMI, PERRLA Ears:  canals and TMs NI Nose/Sinuses:  negative Mouth/Throat:  Mucosa moist, no lesions; pharynx without erythema, edema or exudate. Neck:  neck- supple, no mass, non-tender and no jvd Lungs: Clear to auscultation. Distant breath sounds. No rales, rhonchi, or wheezing. Normal effort, no accessory muscle use. Heart:  Heart sounds are distant.  Regular rate and rhythm without murmur, gallop or rub. No bruits. Abdomen:  BS+, soft, NT, ND, no masses or organomegaly Musculoskeletal:  No muscle group atrophy or asymmetry, gait normal Neurologic:  Alert and oriented x 3, gait normal., reflexes normal and symmetric, strength and  sensation grossly normal Psych exam: Nml mood and affect, age appropriate judgment and insight  Paroxysmal atrial fibrillation (HCC) - Plan: Basic metabolic panel  Chronic systolic CHF (congestive heart failure) (HCC) - Plan: carvedilol (COREG) 3.125 MG tablet  Orthostasis - Plan: CBC  Fall, initial encounter - Plan: Ambulatory referral to Home Health  Discharge summary and medication list have been reviewed/reconciled.  Labs pending at the time of discharge have been reviewed or are still pending at the time of this visit.  Follow-up labs and appointments have been ordered and/or coordinated appropriately. Educational materials regarding the patient's admitting diagnosis provided.  TRANSITIONAL CARE MANAGEMENT  CERTIFICATION:  I certify the following are true:   1. Communication with the patient/care giver was made within 2 business days of discharge.  2. Complexity of Medical decision making is moderate.  3. Face to face visit occurred within 14 days of discharge.   Decrease dose of Coreg to 3.125 mg BID and dose of Flomax to 0.4 mg/d rather than 0.8 mg/d, f/u with urology team.  Stay well hydrated and keep eating.  Will try to get home PT involved to make sure he is physically optimized to avoid falls.  He does have fu with the cardiology team, EKG this week.  F/u 6 weeks to reck thyroid. The patient and his daughter voiced understanding and agreement to the plan.  Jilda Roche Clayton, DO 09/14/17 12:26 PM

## 2017-09-14 NOTE — Progress Notes (Signed)
Pre visit review using our clinic review tool, if applicable. No additional management support is needed unless otherwise documented below in the visit note. 

## 2017-09-14 NOTE — Patient Instructions (Addendum)
Stay well hydrated and keep eating.   1-2 days for the results of your labs.  If you do not hear anything about your home health referral in the next week, call our office and ask for an update.  We have called in a new dose of the Coreg.  If you are having issues getting in with Dr. Jens Som and they cannot get you in sooner, let us know.  Let us know if you need anything.

## 2017-09-16 ENCOUNTER — Other Ambulatory Visit (INDEPENDENT_AMBULATORY_CARE_PROVIDER_SITE_OTHER): Payer: Medicare Other

## 2017-09-16 ENCOUNTER — Other Ambulatory Visit: Payer: Self-pay | Admitting: Family Medicine

## 2017-09-16 DIAGNOSIS — D649 Anemia, unspecified: Secondary | ICD-10-CM

## 2017-09-16 DIAGNOSIS — W19XXXA Unspecified fall, initial encounter: Secondary | ICD-10-CM

## 2017-09-16 LAB — BASIC METABOLIC PANEL
BUN: 17 mg/dL (ref 6–23)
CHLORIDE: 102 meq/L (ref 96–112)
CO2: 32 meq/L (ref 19–32)
Calcium: 9.4 mg/dL (ref 8.4–10.5)
Creatinine, Ser: 1.38 mg/dL (ref 0.40–1.50)
GFR: 53.24 mL/min — ABNORMAL LOW (ref >60.00)
GLUCOSE: 99 mg/dL (ref 70–99)
POTASSIUM: 5.4 meq/L — AB (ref 3.5–5.1)
Sodium: 140 mEq/L (ref 135–145)

## 2017-09-17 ENCOUNTER — Other Ambulatory Visit: Payer: Self-pay | Admitting: Family Medicine

## 2017-09-17 ENCOUNTER — Ambulatory Visit (HOSPITAL_BASED_OUTPATIENT_CLINIC_OR_DEPARTMENT_OTHER)
Admission: RE | Admit: 2017-09-17 | Discharge: 2017-09-17 | Disposition: A | Discharge status: Home / Self Care | Payer: Medicare Other | Source: Ambulatory Visit | Attending: Cardiology | Admitting: Cardiology

## 2017-09-17 DIAGNOSIS — I7090 Unspecified atherosclerosis: Secondary | ICD-10-CM | POA: Diagnosis not present

## 2017-09-17 DIAGNOSIS — I679 Cerebrovascular disease, unspecified: Secondary | ICD-10-CM | POA: Diagnosis not present

## 2017-09-17 DIAGNOSIS — I10 Essential (primary) hypertension: Secondary | ICD-10-CM | POA: Diagnosis not present

## 2017-09-17 DIAGNOSIS — R0602 Shortness of breath: Secondary | ICD-10-CM | POA: Diagnosis not present

## 2017-09-17 DIAGNOSIS — E875 Hyperkalemia: Secondary | ICD-10-CM

## 2017-09-17 DIAGNOSIS — E785 Hyperlipidemia, unspecified: Secondary | ICD-10-CM | POA: Insufficient documentation

## 2017-09-17 LAB — VAS US CAROTID
LCCADSYS: 79 cm/s
LCCAPDIAS: 22 cm/s
LEFT ECA DIAS: -20 cm/s
LEFT VERTEBRAL DIAS: 10 cm/s
LICAPDIAS: 26 cm/s
Left CCA dist dias: 20 cm/s
Left CCA prox sys: 100 cm/s
Left ICA dist dias: -30 cm/s
Left ICA dist sys: -111 cm/s
Left ICA prox sys: 110 cm/s
RCCADSYS: -124 cm/s
RCCAPDIAS: 26 cm/s
RCCAPSYS: 108 cm/s
RIGHT ECA DIAS: -19 cm/s
RIGHT VERTEBRAL DIAS: 21 cm/s

## 2017-09-17 NOTE — Progress Notes (Signed)
   Carotid duplex performed. Jimmy Carolann Brazell RDCS  Austen Oyster, Genene Churn 09/17/2017, 3:15 PM

## 2017-09-21 ENCOUNTER — Other Ambulatory Visit (INDEPENDENT_AMBULATORY_CARE_PROVIDER_SITE_OTHER): Payer: Medicare Other

## 2017-09-21 ENCOUNTER — Telehealth: Payer: Self-pay | Admitting: Family Medicine

## 2017-09-21 ENCOUNTER — Other Ambulatory Visit: Payer: Medicare Other

## 2017-09-21 DIAGNOSIS — E875 Hyperkalemia: Secondary | ICD-10-CM | POA: Diagnosis not present

## 2017-09-21 LAB — BASIC METABOLIC PANEL
BUN: 18 mg/dL (ref 6–23)
CALCIUM: 9.4 mg/dL (ref 8.4–10.5)
CO2: 30 mEq/L (ref 19–32)
Chloride: 101 mEq/L (ref 96–112)
Creatinine, Ser: 1.39 mg/dL (ref 0.40–1.50)
GFR: 52.79 mL/min — ABNORMAL LOW (ref >60.00)
Glucose, Bld: 105 mg/dL — ABNORMAL HIGH (ref 70–99)
POTASSIUM: 4.9 meq/L (ref 3.5–5.1)
Sodium: 138 mEq/L (ref 135–145)

## 2017-09-21 NOTE — Telephone Encounter (Signed)
Copied from CRM [id number]. Topic: Quick Communication - See Telephone Encounter >> Sep 21, 2017  4:45 PM Raquel Sarna wrote: [name] - [facility] - [phone]  Needing verbal orders to check pt's O2 stats during PT session

## 2017-09-21 NOTE — Telephone Encounter (Signed)
Called [facility] to inform verbal ok given per PCP.

## 2017-09-22 ENCOUNTER — Telehealth: Payer: Self-pay | Admitting: Family Medicine

## 2017-09-22 ENCOUNTER — Other Ambulatory Visit: Payer: Self-pay | Admitting: Family Medicine

## 2017-09-22 MED ORDER — SODIUM POLYSTYRENE SULFONATE 15 GM/60ML PO SUSP: 15.0000 g | Freq: Once | ORAL | 0 refills | Status: AC

## 2017-09-22 NOTE — Telephone Encounter (Signed)
Copied from CRM [id number]. Topic: Quick Communication - See Telephone Encounter >> Sep 22, 2017  1:18 PM Landry Mellow wrote: CRM for notification. See Telephone encounter for:   09/22/17. Pt returning call for labs results, nt line was busy.  Please call [phone]

## 2017-09-23 ENCOUNTER — Other Ambulatory Visit: Payer: Self-pay | Admitting: Family Medicine

## 2017-09-23 ENCOUNTER — Telehealth: Payer: Self-pay | Admitting: Family Medicine

## 2017-09-23 DIAGNOSIS — E875 Hyperkalemia: Secondary | ICD-10-CM

## 2017-09-23 NOTE — Telephone Encounter (Signed)
See result note.  

## 2017-09-23 NOTE — Telephone Encounter (Signed)
Copied from CRM [id number]. Topic: Quick Communication - See Telephone Encounter >> Sep 23, 2017  5:18 PM Trula Slade wrote: CRM for notification. See Telephone encounter for: Maralyn Sago w/[facility] [phone] said there is not a substitute for Kionex 15grams per 45ml.  They said the prescription will have to be changed completely, or sent to a pharmacist that does compounds.  09/23/17.

## 2017-09-24 ENCOUNTER — Telehealth: Payer: Self-pay | Admitting: *Deleted

## 2017-09-24 NOTE — Telephone Encounter (Signed)
Received Physician Orders from [facility]; forwarded to provider/SLS 02/28

## 2017-09-24 NOTE — Telephone Encounter (Signed)
OK. This is not something that is compounded to my knowledge. Let's see how things are going at the recheck. TY.

## 2017-09-30 ENCOUNTER — Encounter: Payer: Self-pay | Admitting: Family Medicine

## 2017-10-05 ENCOUNTER — Other Ambulatory Visit: Payer: Self-pay | Admitting: Family Medicine

## 2017-10-05 LAB — HM DIABETES EYE EXAM

## 2017-10-06 ENCOUNTER — Other Ambulatory Visit (INDEPENDENT_AMBULATORY_CARE_PROVIDER_SITE_OTHER): Payer: Medicare Other

## 2017-10-06 DIAGNOSIS — E875 Hyperkalemia: Secondary | ICD-10-CM | POA: Diagnosis not present

## 2017-10-06 LAB — BASIC METABOLIC PANEL
BUN: 18 mg/dL (ref 6–23)
CHLORIDE: 104 meq/L (ref 96–112)
CO2: 29 meq/L (ref 19–32)
Calcium: 9.6 mg/dL (ref 8.4–10.5)
Creatinine, Ser: 1.21 mg/dL (ref 0.40–1.50)
GFR: 61.95 mL/min (ref >60.00)
GLUCOSE: 120 mg/dL — AB (ref 70–99)
POTASSIUM: 4.6 meq/L (ref 3.5–5.1)
SODIUM: 140 meq/L (ref 135–145)

## 2017-10-07 ENCOUNTER — Telehealth: Payer: Self-pay | Admitting: *Deleted

## 2017-10-07 NOTE — Telephone Encounter (Signed)
Received Physician Orders from [facility]; forwarded to provider/SLS 03/13  

## 2017-11-05 ENCOUNTER — Telehealth: Payer: Self-pay | Admitting: *Deleted

## 2017-11-05 NOTE — Telephone Encounter (Signed)
Received Physician Orders/Plan of Care from [facility]; forwarded to provider/SLS 04/11

## 2017-11-09 ENCOUNTER — Telehealth: Payer: Self-pay | Admitting: Family Medicine

## 2017-11-09 NOTE — Telephone Encounter (Signed)
Copied from CRM [id number]. Topic: Quick Communication - Rx Refill/Question >> Nov 09, 2017  3:10 PM Cipriano Bunker wrote:  Medication: apixaban (ELIQUIS) 5 MG TABS tablet  Pt. Will be out tomorrow 4/16, will need to pick up tomorrow  Has the patient contacted their pharmacy? No. (Agent: If no, request that the patient contact the pharmacy for the refill.) Preferred Pharmacy (with phone number or street name):   [id number] - [place], [place] - [address] [place] [address] Phone: [phone] Fax: [phone]   Agent: Please be advised that RX refills may take up to 3 business days. We ask that you follow-up with your pharmacy.

## 2017-11-09 NOTE — Telephone Encounter (Signed)
Request for Apixaban(Eliquis) refill. Last refilled by another provider. Pt states he will be out of medication on tomorrow.    LOV: 09/14/17  Dr. Gypsy Balsam in [facility]  [address]

## 2017-11-10 MED ORDER — APIXABAN 5 MG PO TABS: 5.0000 mg | ORAL_TABLET | Freq: Two times a day (BID) | ORAL | 1 refills | Status: DC

## 2017-11-10 NOTE — Telephone Encounter (Signed)
Refill done/patient made aware.

## 2017-11-19 NOTE — Progress Notes (Deleted)
HPI: FU CAD.Cardiac cath 2/18 showed 30 LM, 50 LAD, aneurysmal RCA with 80 mid to distal; had DES to RCA at that time. 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 There was aneurysmal dilatation of the aorta at the level of the SMA at 3.1 cm. 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 There was a mass at the left apex. Patient is now followed by pulmonary for this mass. Last echo 2/19 showed EF 40-45, grade 2 DD, mild RAE. Carotid dopplers 2/19 showed no significant stenosis. Admitted with sepsis 2/19 and also with atrial fibrillation, converted spontaneously to sinus; TSH low and synthroid adjusted. Placed on amiodarone. Since last seen,  Current Outpatient Medications  Medication Sig Dispense Refill  . acetaminophen (TYLENOL) 325 MG tablet Take 650 mg by mouth daily as needed for moderate pain or headache.     . albuterol (PROVENTIL) (2.5 MG/3ML) 0.083% nebulizer solution 1 vial in nebulizer every 6 hours as needed Dx 496 (Patient taking differently: Take 2.5 mg by nebulization every 6 (six) hours as needed for wheezing or shortness of breath. 1 vial in nebulizer every 6 hours as needed Dx 496) 120 mL 6  . ALPRAZolam (XANAX) 0.5 MG tablet Take 0.5 mg by mouth 2 (two) times daily.    [name] amiodarone (PACERONE) 200 MG tablet TAKE 1 TABLET BY MOUTH TWICE DAILY FOR 2 WEEKS, THEN 1 TABLET DAILY 60 tablet 0  . apixaban (ELIQUIS) 5 MG TABS tablet Take 1 tablet (5 mg total) by mouth 2 (two) times daily. 60 tablet 1  . aspirin EC 81 MG tablet Take 1 tablet (81 mg total) by mouth daily.    [name] atorvastatin (LIPITOR) 40 MG tablet Take 1 tablet (40 mg total) by mouth daily. 90 tablet 3  . Carboxymethylcellul-Glycerin (LUBRICATING EYE DROPS OP) Apply 1 drop to eye daily as needed (dry eyes).    . carvedilol (COREG) 3.125 MG tablet Take 1 tablet (3.125 mg total) by mouth 2 (two) times daily  with a meal. 60 tablet 3  . Cholecalciferol (VITAMIN D3) 2000 units capsule Take 2,000 Units by mouth daily.     . furosemide (LASIX) 40 MG tablet TK 1 T PO D PRN FOR FLUID OR EDEMA  11  . insulin glargine (LANTUS) 100 UNIT/ML injection Inject 0.4 mLs (40 Units total) into the skin at bedtime. 10 mL 0  . levothyroxine (SYNTHROID, LEVOTHROID) 125 MCG tablet Take 1.5 tablets (188 mcg total) by mouth daily before breakfast. 60 tablet 0  . losartan (COZAAR) 100 MG tablet Take 1 tablet (100 mg total) by mouth daily. 30 tablet 0  . magnesium (MAGTAB) 84 MG ( ) TBCR SR tablet Take 168 mg by mouth at bedtime.    . meclizine (ANTIVERT) 25 MG tablet TAKE 1 TABLET BY MOUTH THREE TIMES DAILY AS NEEDED FOR DIZZINESS 30 tablet 0  . metFORMIN (GLUCOPHAGE-XR) 750 MG 24 hr tablet Take 750 mg by mouth 2 (two) times daily.    . Multiple Vitamins-Minerals (MEGA MULTIVITAMIN FOR MEN PO) Take 1 tablet by mouth daily.     . OXYGEN Inhale 2 L into the lungs continuous.    . predniSONE (DELTASONE) 10 MG tablet 2 tabs daily for 1 week, then 1 tab daily until seen back . (Patient taking differently: Take 10 mg by mouth daily with breakfast. ) 45 tablet 1  . pregabalin (LYRICA) 50  MG capsule Take 50 mg by mouth 2 (two) times daily.    . Semaglutide (OZEMPIC) 0.25 or 0.5 MG/DOSE SOPN Inject 0.25-0.5 mg into the skin every 7 (seven) days.     [name] spironolactone (ALDACTONE) 25 MG tablet Take 0.5 tablets (12.5 mg total) by mouth daily. 90 tablet 3  . tamsulosin (FLOMAX) 0.4 MG CAPS capsule Take 1 capsule (0.4 mg total) by mouth at bedtime. 30 capsule 0  . tiotropium (SPIRIVA HANDIHALER) 18 MCG inhalation capsule Place 1 capsule (18 mcg total) into inhaler and inhale daily. 30 capsule 4   No current facility-administered medications for this visit.      Past Medical History:  Diagnosis Date  . Anxiety   . Aortic aneurysm (HCC) 09/02/2017  . BENIGN PROSTATIC HYPERTROPHY 11/23/2009  . Cardiomyopathy (HCC) 08/28/2016  . Chronic  systolic CHF (congestive heart failure) (HCC) 09/02/2017  . COPD (chronic obstructive pulmonary disease) (HCC)   . CORONARY ARTERY DISEASE 11/23/2009  . 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 DEGENERATIVE JOINT DISEASE 11/23/2009  . DEPRESSION 11/23/2009  . FATIGUE 11/23/2009  . GAIT DISTURBANCE 12/10/2009  . HEMOPTYSIS UNSPECIFIED 05/07/2010  . High cholesterol   . HYPERTENSION 07/30/2009  . HYPOTHYROIDISM 07/30/2009  . Ischemic cardiomyopathy 09/02/2017  . 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 On home oxygen therapy    "2-3L; 24/7" (09/10/2016)  . OSA on CPAP   . PTSD (post-traumatic stress disorder) 03/10/2012  . 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 RA (rheumatoid arthritis) (HCC) 06/11/2011   "qwhere" (09/10/2016)  . RESPIRATORY FAILURE, CHRONIC 07/31/2009  . Scleritis of both eyes 03/17/2014  . 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 Type II diabetes mellitus (HCC)     Past Surgical History:  Procedure Laterality Date  . 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 ABDOMINAL EXPLORATION SURGERY  02/2004   w/LOA/notes 12/10/2010; small bowel obstruction repair with adhesiolysis   . BACK SURGERY    . CARDIAC CATHETERIZATION     2 heart caths in the past.  One in 2000s showed one ulcerated plaque  Rx medically; Second at [facility] [name] 09/05/2016  . CATARACT EXTRACTION W/ INTRAOCULAR LENS  IMPLANT, BILATERAL Bilateral 2000s  . COLECTOMY     hx of remote ileum resection due to bleeding  . CORONARY ANGIOPLASTY WITH STENT PLACEMENT  09/10/2016  . CORONARY STENT INTERVENTION N/A 09/10/2016   Procedure: Coronary Stent Intervention;  Surgeon: Kathleene Hazel, MD;  Location: [facility] INVASIVE CV LAB;  Service: Cardiovascular;  Laterality: N/A;  Distal RCA 4.0x16 Synergy  . FEMORAL EMBOLOECTOMY Left 07/2000   with left leg ischemia; Dr. Hart Rochester, vascular  . GANGLION CYST EXCISION Right    "wrist"; Dr. Teressa Senter  . LUMBAR LAMINECTOMY  1972   Dr. Fannie Knee  . RIGHT/LEFT HEART CATH AND CORONARY ANGIOGRAPHY N/A  09/10/2016   Procedure: Right/Left Heart Cath and Coronary Angiography;  Surgeon: Kathleene Hazel, MD;  Location: [facility] INVASIVE CV LAB;  Service: Cardiovascular;  Laterality: N/A;  . TONSILLECTOMY      Social History   Socioeconomic History  . Marital status: Married    Spouse name: Not on file  . Number of children: Not on file  . Years of education: Not on file  . Highest education level: Not on file  Occupational History  . Occupation: disabled Cytogeneticist, Ex Neurosurgeon: RETIRED  Social Needs  . Financial resource strain: Not on file  . Food insecurity:    Worry: Not on file  Inability: Not on file  . Transportation needs:    Medical: Not on file    Non-medical: Not on file  Tobacco Use  . Smoking status: Former Smoker    Packs/day: 2.50    Years: 40.00    Pack years: 100.00    Types: Cigarettes, Pipe, Cigars    Last attempt to quit: 07/28/1998    Years since quitting: 19.3  . Smokeless tobacco: Never Used  Substance and Sexual Activity  . Alcohol use: No    Alcohol/week: 0.0 oz  . Drug use: No  . Sexual activity: Not on file  Lifestyle  . Physical activity:    Days per week: Not on file    Minutes per session: Not on file  . Stress: Not on file  Relationships  . Social connections:    Talks on phone: Not on file    Gets together: Not on file    Attends religious service: Not on file    Active member of club or organization: Not on file    Attends meetings of clubs or organizations: Not on file    Relationship status: Not on file  . Intimate partner violence:    Fear of current or ex partner: Not on file    Emotionally abused: Not on file    Physically abused: Not on file    Forced sexual activity: Not on file  Other Topics Concern  . Not on file  Social History Narrative   Lives in the home with his wife   Sees [place] every 6 month for meds.   Denies asbestos exposure    Family History  Problem Relation Age of Onset  . Other Mother         gun shot    ROS: no fevers or chills, productive cough, hemoptysis, dysphasia, odynophagia, melena, hematochezia, dysuria, hematuria, rash, seizure activity, orthopnea, PND, pedal edema, claudication. Remaining systems are negative.  Physical Exam: Well-developed well-nourished in no acute distress.  Skin is warm and dry.  HEENT is normal.  Neck is supple.  Chest is clear to auscultation with normal expansion.  Cardiovascular exam is regular rate and rhythm.  Abdominal exam nontender or distended. No masses palpated. Extremities show no edema. neuro grossly intact  ECG- personally reviewed  A/P  1 coronary artery disease-no chest pain.  Continue aspirin and statin.  2 paroxysmal atrial fibrillation-patient in sinus rhythm on examination today.  Patient's atrial fibrillation occurred in the setting of urosepsis.  I would like to avoid amiodarone long-term given severe COPD.  We will discontinue and follow for recurrent arrhythmias.  Continue beta-blocker.  Continue apixaban.  Check hemoglobin and renal function.  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  4 cardiomyopathy-continue beta-blocker and ARB.  5 Hypertension-blood pressure is controlled.  Continue present medications.  6 hyperlipidemia-continue statin.  7 chronic combined systolic/diastolic congestive heart failure-patient is not volume overloaded on examination.  Continue low-sodium diet and fluid restriction.  Olga Millers, MD

## 2017-11-25 ENCOUNTER — Other Ambulatory Visit: Payer: Self-pay | Admitting: Physician Assistant

## 2017-11-25 ENCOUNTER — Ambulatory Visit: Payer: Medicare Other | Admitting: Cardiology

## 2017-11-25 ENCOUNTER — Other Ambulatory Visit: Payer: Self-pay | Admitting: Family Medicine

## 2017-11-25 ENCOUNTER — Other Ambulatory Visit: Payer: Self-pay | Admitting: Student

## 2017-11-26 NOTE — Progress Notes (Signed)
HPI: FU CAD.Cardiac cath 2/18 showed 30 LM, 50 LAD, aneurysmal RCA with 80 mid to distal; had DES to RCA at that time. 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 There was aneurysmal dilatation of the aorta at the level of the SMA at 3.1 cm. 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 There was a mass at the left apex. Patient is now followed by pulmonary for this mass. Last echo 2/19 showed EF 40-45, grade 2 DD, mild RAE. Carotid dopplers 2/19 showed no significant stenosis. Admitted with sepsis 2/19 and also with atrial fibrillation, converted spontaneously to sinus; TSH low and synthroid adjusted. Placed on amiodarone. Since last seen,patient has some dyspnea on exertion but no orthopnea, PND, pedal edema or chest pain.  He has having dizzy spells/near syncope.  These are not related to position.  No associated palpitations.  Last 10 minutes and resolved.  Current Outpatient Medications  Medication Sig Dispense Refill  . acetaminophen (TYLENOL) 325 MG tablet Take 650 mg by mouth daily as needed for moderate pain or headache.     . albuterol (PROVENTIL) (2.5 MG/3ML) 0.083% nebulizer solution 1 vial in nebulizer every 6 hours as needed Dx 496 (Patient taking differently: Take 2.5 mg by nebulization every 6 (six) hours as needed for wheezing or shortness of breath. 1 vial in nebulizer every 6 hours as needed Dx 496) 120 mL 6  . ALPRAZolam (XANAX) 0.5 MG tablet Take 0.5 mg by mouth 2 (two) times daily.    [name] apixaban (ELIQUIS) 5 MG TABS tablet Take 1 tablet (5 mg total) by mouth 2 (two) times daily. 60 tablet 1  . aspirin EC 81 MG tablet Take 1 tablet (81 mg total) by mouth daily.    [name] atorvastatin (LIPITOR) 40 MG tablet Take 1 tablet (40 mg total) by mouth daily. 90 tablet 3  . Carboxymethylcellul-Glycerin (LUBRICATING EYE DROPS OP) Apply 1 drop to eye daily as needed (dry eyes).     . carvedilol (COREG) 3.125 MG tablet Take 1 tablet (3.125 mg total) by mouth 2 (two) times daily with a meal. 60 tablet 3  . Cholecalciferol (VITAMIN D3) 2000 units capsule Take 2,000 Units by mouth daily.     . furosemide (LASIX) 40 MG tablet TK 1 T PO D PRN FOR FLUID OR EDEMA  11  . insulin glargine (LANTUS) 100 UNIT/ML injection Inject 0.4 mLs (40 Units total) into the skin at bedtime. 10 mL 0  . levothyroxine (SYNTHROID, LEVOTHROID) 125 MCG tablet Take 1.5 tablets (188 mcg total) by mouth daily before breakfast. 60 tablet 0  . losartan (COZAAR) 100 MG tablet Take 1 tablet (100 mg total) by mouth daily. (Patient taking differently: Take 50 mg by mouth daily. ) 30 tablet 0  . magnesium (MAGTAB) 84 MG ( ) TBCR SR tablet Take 168 mg by mouth at bedtime.    . meclizine (ANTIVERT) 25 MG tablet TAKE 1 TABLET BY MOUTH THREE TIMES DAILY AS NEEDED FOR DIZZINESS 30 tablet 0  . metFORMIN (GLUCOPHAGE-XR) 750 MG 24 hr tablet Take 750 mg by mouth 2 (two) times daily.    . Multiple Vitamins-Minerals (MEGA MULTIVITAMIN FOR MEN PO) Take 1 tablet by mouth daily.     . OXYGEN Inhale 2 L into the lungs continuous.    . pregabalin (LYRICA) 50 MG capsule Take 50 mg by mouth 2 (two) times daily.    [name]  Semaglutide (OZEMPIC) 0.25 or 0.5 MG/DOSE SOPN Inject 0.25-0.5 mg into the skin every 7 (seven) days.     [name] spironolactone (ALDACTONE) 25 MG tablet TAKE 1 TABLET(25 MG) BY MOUTH DAILY 30 tablet 0  . tamsulosin (FLOMAX) 0.4 MG CAPS capsule Take 1 capsule (0.4 mg total) by mouth at bedtime. 30 capsule 0  . tiotropium (SPIRIVA HANDIHALER) 18 MCG inhalation capsule Place 1 capsule (18 mcg total) into inhaler and inhale daily. 30 capsule 4   No current facility-administered medications for this visit.      Past Medical History:  Diagnosis Date  . Anxiety   . Aortic aneurysm (HCC) 09/02/2017  . BENIGN PROSTATIC HYPERTROPHY 11/23/2009  . Cardiomyopathy (HCC) 08/28/2016  . Chronic systolic CHF (congestive heart  failure) (HCC) 09/02/2017  . COPD (chronic obstructive pulmonary disease) (HCC)   . CORONARY ARTERY DISEASE 11/23/2009  . 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 DEGENERATIVE JOINT DISEASE 11/23/2009  . DEPRESSION 11/23/2009  . FATIGUE 11/23/2009  . GAIT DISTURBANCE 12/10/2009  . HEMOPTYSIS UNSPECIFIED 05/07/2010  . High cholesterol   . HYPERTENSION 07/30/2009  . HYPOTHYROIDISM 07/30/2009  . Ischemic cardiomyopathy 09/02/2017  . 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 On home oxygen therapy    "2-3L; 24/7" (09/10/2016)  . OSA on CPAP   . PTSD (post-traumatic stress disorder) 03/10/2012  . 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 RA (rheumatoid arthritis) (HCC) 06/11/2011   "qwhere" (09/10/2016)  . RESPIRATORY FAILURE, CHRONIC 07/31/2009  . Scleritis of both eyes 03/17/2014  . 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 Type II diabetes mellitus (HCC)     Past Surgical History:  Procedure Laterality Date  . 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 ABDOMINAL EXPLORATION SURGERY  02/2004   w/LOA/notes 12/10/2010; small bowel obstruction repair with adhesiolysis   . BACK SURGERY    . CARDIAC CATHETERIZATION     2 heart caths in the past.  One in 2000s showed one ulcerated plaque  Rx medically; Second at [facility] [name] 09/05/2016  . CATARACT EXTRACTION W/ INTRAOCULAR LENS  IMPLANT, BILATERAL Bilateral 2000s  . COLECTOMY     hx of remote ileum resection due to bleeding  . CORONARY ANGIOPLASTY WITH STENT PLACEMENT  09/10/2016  . CORONARY STENT INTERVENTION N/A 09/10/2016   Procedure: Coronary Stent Intervention;  Surgeon: Kathleene Hazel, MD;  Location: [facility] INVASIVE CV LAB;  Service: Cardiovascular;  Laterality: N/A;  Distal RCA 4.0x16 Synergy  . FEMORAL EMBOLOECTOMY Left 07/2000   with left leg ischemia; Dr. Hart Rochester, vascular  . GANGLION CYST EXCISION Right    "wrist"; Dr. Teressa Senter  . LUMBAR LAMINECTOMY  1972   Dr. Fannie Knee  . RIGHT/LEFT HEART CATH AND CORONARY ANGIOGRAPHY N/A 09/10/2016   Procedure: Right/Left  Heart Cath and Coronary Angiography;  Surgeon: Kathleene Hazel, MD;  Location: [facility] INVASIVE CV LAB;  Service: Cardiovascular;  Laterality: N/A;  . TONSILLECTOMY      Social History   Socioeconomic History  . Marital status: Married    Spouse name: Not on file  . Number of children: Not on file  . Years of education: Not on file  . Highest education level: Not on file  Occupational History  . Occupation: disabled Cytogeneticist, Ex Neurosurgeon: RETIRED  Social Needs  . Financial resource strain: Not on file  . Food insecurity:    Worry: Not on file    Inability: Not on file  . Transportation needs:    Medical: Not on file  Non-medical: Not on file  Tobacco Use  . Smoking status: Former Smoker    Packs/day: 2.50    Years: 40.00    Pack years: 100.00    Types: Cigarettes, Pipe, Cigars    Last attempt to quit: 07/28/1998    Years since quitting: 19.3  . Smokeless tobacco: Never Used  Substance and Sexual Activity  . Alcohol use: No    Alcohol/week: 0.0 oz  . Drug use: No  . Sexual activity: Not on file  Lifestyle  . Physical activity:    Days per week: Not on file    Minutes per session: Not on file  . Stress: Not on file  Relationships  . Social connections:    Talks on phone: Not on file    Gets together: Not on file    Attends religious service: Not on file    Active member of club or organization: Not on file    Attends meetings of clubs or organizations: Not on file    Relationship status: Not on file  . Intimate partner violence:    Fear of current or ex partner: Not on file    Emotionally abused: Not on file    Physically abused: Not on file    Forced sexual activity: Not on file  Other Topics Concern  . Not on file  Social History Narrative   Lives in the home with his wife   Sees [place] every 6 month for meds.   Denies asbestos exposure    Family History  Problem Relation Age of Onset  . Other Mother        gun shot    ROS: no fevers  or chills, productive cough, hemoptysis, dysphasia, odynophagia, melena, hematochezia, dysuria, hematuria, rash, seizure activity, orthopnea, PND, pedal edema, claudication. Remaining systems are negative.  Physical Exam: Well-developed well-nourished in no acute distress.  Skin is warm and dry.  HEENT is normal.  Neck is supple.  Chest with diminished breath sounds throughout Cardiovascular exam is regular rate and rhythm.  Abdominal exam nontender or distended. No masses palpated. Extremities show no edema. neuro grossly intact  ECG-sinus bradycardia at a rate of 54.  First-degree AV block.  Left bundle branch block.  Personally reviewed  A/P  1 coronary artery disease-no chest pain.  Continue aspirin and statin.  2 paroxysmal atrial fibrillation-patient in sinus rhythm on examination today.  Patient's atrial fibrillation occurred in the setting of urosepsis.  I would like to avoid amiodarone long-term given severe COPD.  He is not taking and we will not resume unless he has recurrent symptoms.  Continue beta-blocker.  Continue apixaban.    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  4 cardiomyopathy-continue beta-blocker and ARB.  5 Hypertension-blood pressure is controlled.  Continue present medications.  6 hyperlipidemia-continue statin.  7 chronic combined systolic/diastolic congestive heart failure-patient is not volume overloaded on examination.  Continue low-sodium diet and fluid restriction.  8 near syncope-patient is having dizzy spells that lasts 10 minutes.  He wonders if this may be related to recurrent atrial fibrillation as he was having some dizzy spells in the hospital.  I will check an event monitor to exclude recurrent atrial fibrillation versus pauses.  Olga Millers, MD

## 2017-12-01 ENCOUNTER — Ambulatory Visit: Payer: Medicare Other | Admitting: Pulmonary Disease

## 2017-12-01 ENCOUNTER — Encounter: Payer: Self-pay | Admitting: Pulmonary Disease

## 2017-12-01 DIAGNOSIS — R918 Other nonspecific abnormal finding of lung field: Secondary | ICD-10-CM

## 2017-12-01 DIAGNOSIS — J449 Chronic obstructive pulmonary disease, unspecified: Secondary | ICD-10-CM

## 2017-12-01 DIAGNOSIS — J9611 Chronic respiratory failure with hypoxia: Secondary | ICD-10-CM | POA: Diagnosis not present

## 2017-12-01 DIAGNOSIS — J441 Chronic obstructive pulmonary disease with (acute) exacerbation: Secondary | ICD-10-CM

## 2017-12-01 NOTE — Progress Notes (Signed)
   Subjective:    Patient ID: [name], male    DOB: 25-Jan-1941, 77 y.o.   MRN: [id number]  HPI  77 yo smoker with goldDCOPD, chronic respiratory failure on oxygen Has rheumatoid arthritis and diabetes He had cardiac cath with PCI to RCA and is on Plavix since 08/2016  He was treated for left upper lobe pneumonia in June 2018, this seemed to resolve but there was some residual density in the left upper lobe.PET scan showed hypermetabolic left upper lobe platelike density SUV 5.5.  I reviewed serial CTs since then including the last one from 08/2017  He has lost about 18 pounds over the last year.  His wife is now on hospice for a few months and morphine has been started.  He admits to some level of depression but is accepting of for illness.  His breathing has been stable.  He does the cooking. He remains on amiodarone for atrial fibrillation as a cardiology appointment tomorrow    Significant tests/ events reviewed  PSG 04/2014 showed severe OSA, AHI 48/h with nadir desatn 78% correctd by CPAP 12 cm, 3 L O2 blended in , c flex +2 cm, humidity, A medium full face mask was used.  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  PFT 10/2015 FEV1 was 36%, ratio 46, FVC 58%, DLCO 26%   CT chest 12/2016 -masslike consolidation in LUL   PET scan 03/20/17 >+metabolic act in LUL consolidation  CT chest 08/2017 >> 2.4 x 3.5 cm triangular subpleural opacity in the anterior left upper lobe > has resolved the platelike scarring bilateral nodular infiltrates stable,   Review of Systems Patient denies significant dyspnea,cough, hemoptysis,  chest pain, palpitations, pedal edema, orthopnea, paroxysmal nocturnal dyspnea, lightheadedness, nausea, vomiting, abdominal or  leg pains        Objective:   Physical Exam  Gen. Pleasant, well-nourished, in no distress ENT - no thrush, no post nasal drip Neck: No JVD, no thyromegaly, no carotid bruits Lungs: no use of accessory muscles, no dullness to percussion,decreased BL without rales or rhonchi  Cardiovascular: Rhythm regular, heart sounds  normal, no murmurs or gallops, 1+ peripheral edema Musculoskeletal: No deformities, no cyanosis or clubbing        Assessment & Plan:

## 2017-12-01 NOTE — Assessment & Plan Note (Signed)
Stable and Spiriva and monitor medication that he gets from the [place]. Has been off prednisone now for a few months and has done well

## 2017-12-01 NOTE — Assessment & Plan Note (Signed)
Stable on current level of oxygen

## 2017-12-01 NOTE — Addendum Note (Signed)
Addended by: Maurene Capes on: 12/01/2017 10:12 AM   Modules accepted: Orders

## 2017-12-01 NOTE — Assessment & Plan Note (Signed)
Follow-up scan 6 months in September

## 2017-12-01 NOTE — Patient Instructions (Signed)
Continue on same medications Ct chest in september

## 2017-12-02 ENCOUNTER — Encounter: Payer: Self-pay | Admitting: Cardiology

## 2017-12-02 ENCOUNTER — Ambulatory Visit: Payer: Medicare Other | Admitting: Cardiology

## 2017-12-02 VITALS — BP 128/65 | HR 54 | Ht 74.0 in | Wt 211.4 lb

## 2017-12-02 DIAGNOSIS — I712 Thoracic aortic aneurysm, without rupture, unspecified: Secondary | ICD-10-CM

## 2017-12-02 DIAGNOSIS — I10 Essential (primary) hypertension: Secondary | ICD-10-CM | POA: Diagnosis not present

## 2017-12-02 DIAGNOSIS — I714 Abdominal aortic aneurysm, without rupture, unspecified: Secondary | ICD-10-CM

## 2017-12-02 DIAGNOSIS — I480 Paroxysmal atrial fibrillation: Secondary | ICD-10-CM | POA: Diagnosis not present

## 2017-12-02 DIAGNOSIS — I5022 Chronic systolic (congestive) heart failure: Secondary | ICD-10-CM | POA: Diagnosis not present

## 2017-12-02 MED ORDER — APIXABAN 5 MG PO TABS: 5.0000 mg | ORAL_TABLET | Freq: Two times a day (BID) | ORAL | 3 refills | Status: DC

## 2017-12-02 MED ORDER — CARVEDILOL 3.125 MG PO TABS: 3.1250 mg | ORAL_TABLET | Freq: Two times a day (BID) | ORAL | 3 refills | Status: DC

## 2017-12-02 NOTE — Patient Instructions (Signed)
Medication Instructions:   NO CHANGE  Testing/Procedures:  Your physician has recommended that you wear an event monitor. Event monitors are medical devices that record the heart's electrical activity. Doctors most often [place] these monitors to diagnose arrhythmias. Arrhythmias are problems with the speed or rhythm of the heartbeat. The monitor is a small, portable device. You can wear one while you do your normal daily activities. This is usually used to diagnose what is causing palpitations/syncope (passing out).   CTA OF THE CHEST AND ABDOMEN TO FOLLOW UP ANEURYSMS   Follow-Up:  Your physician recommends that you schedule a follow-up appointment in: 12 WEEKS WITH DR Jens Som   If you need a refill on your cardiac medications before your next appointment, please call your pharmacy.

## 2017-12-10 ENCOUNTER — Ambulatory Visit (INDEPENDENT_AMBULATORY_CARE_PROVIDER_SITE_OTHER): Payer: Medicare Other

## 2017-12-10 DIAGNOSIS — I480 Paroxysmal atrial fibrillation: Secondary | ICD-10-CM

## 2017-12-19 ENCOUNTER — Other Ambulatory Visit: Payer: Self-pay | Admitting: Family Medicine

## 2017-12-19 ENCOUNTER — Other Ambulatory Visit: Payer: Self-pay | Admitting: Physician Assistant

## 2017-12-22 ENCOUNTER — Ambulatory Visit: Payer: Self-pay

## 2017-12-22 NOTE — Telephone Encounter (Signed)
Pt calling with 3 weeks h/o moderate to severe dizziness. Per pt's wife he has passed out before from this. Pt was mowing his grass today and became dizzy and almost fainted. He states he feels lightheaded with standing but not sitting and with walking. Pt states the dizziness comes and goes. Pt hs h/o CHF and atrial fibrillation per his record review. He is currently wearing a holter monitor prescribed by his cardiologist. He denies and chest pain or weakness, dizziness. He states that he sees lights and floaters when he closes his eyes. Pt stated that he is requiring help to stand and walk and without the help he would fall. Pt denies any numbness or weakness to face, arm or leg to either side if his body. He denies chest pain. Called PCP office to see if can get appointment for today. No openings available. Advised to send pt to [facility] or ED for evaluation. Care advice given.  Reason for Disposition . [1] MODERATE dizziness (e.g., interferes with normal activities) AND [2] has NOT been evaluated by physician for this  (Exception: dizziness caused by heat exposure, sudden standing, or poor fluid intake) . Taking a medicine that could cause dizziness (e.g., blood pressure medications, diuretics)  Answer Assessment - Initial Assessment Questions 1. DESCRIPTION: "Describe your dizziness."     Bright lights legs weak and will fall unless he is holding onto something he will fall on the floor- feels like he doesn't have any balance 2. LIGHTHEADED: "Do you feel lightheaded?" (e.g., somewhat faint, woozy, weak upon standing)     yes 3. VERTIGO: "Do you feel like either you or the room is spinning or tilting?" (i.e. vertigo)     no 4. SEVERITY: "How bad is it?"  "Do you feel like you are going to faint?" "Can you stand and walk?"   - MILD - walking normally   - MODERATE - interferes with normal activities (e.g., work, school)    - SEVERE - unable to stand, requires support to walk, feels like passing out now.      Moderate to  Severe comes and goes 5. ONSET:  "When did the dizziness begin?"     3 weeks ago 6. AGGRAVATING FACTORS: "Does anything make it worse?" (e.g., standing, change in head position)     Standing, walking 7. HEART RATE: "Can you tell me your heart rate?" "How many beats in 15 seconds?"  (Note: not all patients can do this)       HR 71 8. CAUSE: "What do you think is causing the dizziness?"     Pt doesn't know- Maybe low blood pressure 9. RECURRENT SYMPTOM: "Have you had dizziness before?" If so, ask: "When was the last time?" "What happened that time?"     Yes- had atrial fibrillation and UTI and was started on medication 10. OTHER SYMPTOMS: "Do you have any other symptoms?" (e.g., fever, chest pain, vomiting, diarrhea, bleeding)       If closes eyes floaters and bright lights  11. PREGNANCY: "Is there any chance you are pregnant?" "When was your last menstrual period?"       n/a  Protocols used: DIZZINESS [facility]

## 2017-12-22 NOTE — Telephone Encounter (Signed)
Rx(s) sent to pharmacy electronically.  

## 2017-12-23 ENCOUNTER — Other Ambulatory Visit: Payer: Self-pay

## 2017-12-23 ENCOUNTER — Emergency Department (HOSPITAL_BASED_OUTPATIENT_CLINIC_OR_DEPARTMENT_OTHER)
Admission: EM | Admit: 2017-12-23 | Discharge: 2017-12-23 | Disposition: A | Discharge status: Home / Self Care | Payer: Medicare Other | Attending: Emergency Medicine | Admitting: Emergency Medicine

## 2017-12-23 ENCOUNTER — Encounter (HOSPITAL_BASED_OUTPATIENT_CLINIC_OR_DEPARTMENT_OTHER): Payer: Self-pay

## 2017-12-23 DIAGNOSIS — E875 Hyperkalemia: Secondary | ICD-10-CM | POA: Diagnosis not present

## 2017-12-23 DIAGNOSIS — Z79899 Other long term (current) drug therapy: Secondary | ICD-10-CM | POA: Diagnosis not present

## 2017-12-23 DIAGNOSIS — E039 Hypothyroidism, unspecified: Secondary | ICD-10-CM | POA: Insufficient documentation

## 2017-12-23 DIAGNOSIS — I110 Hypertensive heart disease with heart failure: Secondary | ICD-10-CM | POA: Diagnosis not present

## 2017-12-23 DIAGNOSIS — Z7982 Long term (current) use of aspirin: Secondary | ICD-10-CM | POA: Diagnosis not present

## 2017-12-23 DIAGNOSIS — I2510 Atherosclerotic heart disease of native coronary artery without angina pectoris: Secondary | ICD-10-CM | POA: Insufficient documentation

## 2017-12-23 DIAGNOSIS — I5022 Chronic systolic (congestive) heart failure: Secondary | ICD-10-CM | POA: Insufficient documentation

## 2017-12-23 DIAGNOSIS — J449 Chronic obstructive pulmonary disease, unspecified: Secondary | ICD-10-CM | POA: Diagnosis not present

## 2017-12-23 DIAGNOSIS — Z794 Long term (current) use of insulin: Secondary | ICD-10-CM | POA: Insufficient documentation

## 2017-12-23 DIAGNOSIS — R195 Other fecal abnormalities: Secondary | ICD-10-CM

## 2017-12-23 DIAGNOSIS — E860 Dehydration: Secondary | ICD-10-CM | POA: Diagnosis not present

## 2017-12-23 DIAGNOSIS — Z87891 Personal history of nicotine dependence: Secondary | ICD-10-CM | POA: Insufficient documentation

## 2017-12-23 DIAGNOSIS — Z7901 Long term (current) use of anticoagulants: Secondary | ICD-10-CM | POA: Insufficient documentation

## 2017-12-23 DIAGNOSIS — R42 Dizziness and giddiness: Secondary | ICD-10-CM | POA: Diagnosis present

## 2017-12-23 DIAGNOSIS — E119 Type 2 diabetes mellitus without complications: Secondary | ICD-10-CM | POA: Insufficient documentation

## 2017-12-23 LAB — CBC WITH DIFFERENTIAL/PLATELET
BASOS ABS: 0 10*3/uL (ref 0.0–0.1)
Basophils Relative: 0 %
Eosinophils Absolute: 0.2 10*3/uL (ref 0.0–0.7)
Eosinophils Relative: 5 %
HEMATOCRIT: 30.2 % — AB (ref 39.0–52.0)
Hemoglobin: 9.9 g/dL — ABNORMAL LOW (ref 13.0–17.0)
LYMPHS ABS: 1.4 10*3/uL (ref 0.7–4.0)
Lymphocytes Relative: 31 %
MCH: 28.3 pg (ref 26.0–34.0)
MCHC: 32.8 g/dL (ref 30.0–36.0)
MCV: 86.3 fL (ref 78.0–100.0)
MONO ABS: 0.6 10*3/uL (ref 0.1–1.0)
MONOS PCT: 14 %
NEUTROS ABS: 2.2 10*3/uL (ref 1.7–7.7)
Neutrophils Relative %: 50 %
Platelets: 81 10*3/uL — ABNORMAL LOW (ref 150–400)
RBC: 3.5 MIL/uL — ABNORMAL LOW (ref 4.22–5.81)
RDW: 15.8 % — AB (ref 11.5–15.5)
WBC: 4.5 10*3/uL (ref 4.0–10.5)

## 2017-12-23 LAB — COMPREHENSIVE METABOLIC PANEL
ALBUMIN: 4.3 g/dL (ref 3.5–5.0)
ALK PHOS: 64 U/L (ref 38–126)
ALT: 17 U/L (ref 17–63)
AST: 22 U/L (ref 15–41)
Anion gap: 10 (ref 5–15)
BUN: 28 mg/dL — ABNORMAL HIGH (ref 6–20)
CALCIUM: 8.7 mg/dL — AB (ref 8.9–10.3)
CO2: 22 mmol/L (ref 22–32)
CREATININE: 1.56 mg/dL — AB (ref 0.61–1.24)
Chloride: 107 mmol/L (ref 101–111)
GFR calc Af Amer: 48 mL/min — ABNORMAL LOW (ref >60)
GFR calc non Af Amer: 41 mL/min — ABNORMAL LOW (ref >60)
GLUCOSE: 125 mg/dL — AB (ref 65–99)
Potassium: 5.4 mmol/L — ABNORMAL HIGH (ref 3.5–5.1)
SODIUM: 139 mmol/L (ref 135–145)
Total Bilirubin: 0.7 mg/dL (ref 0.3–1.2)
Total Protein: 7.2 g/dL (ref 6.5–8.1)

## 2017-12-23 LAB — URINALYSIS, ROUTINE W REFLEX MICROSCOPIC
Bilirubin Urine: NEGATIVE
Glucose, UA: NEGATIVE mg/dL
Hgb urine dipstick: NEGATIVE
KETONES UR: NEGATIVE mg/dL
LEUKOCYTES UA: NEGATIVE
Nitrite: NEGATIVE
Protein, ur: NEGATIVE mg/dL
Specific Gravity, Urine: 1.015 (ref 1.005–1.030)
pH: 6 (ref 5.0–8.0)

## 2017-12-23 LAB — OCCULT BLOOD X 1 CARD TO LAB, STOOL: Fecal Occult Bld: POSITIVE — AB

## 2017-12-23 MED ORDER — SODIUM CHLORIDE 0.9 % IV BOLUS: 1000.0000 mL | Freq: Once | INTRAVENOUS | Status: DC

## 2017-12-23 MED ORDER — SODIUM CHLORIDE 0.9 % IV BOLUS
1000.0000 mL | Freq: Once | INTRAVENOUS | Status: AC
  Administered 2017-12-23: 1000 mL via INTRAVENOUS

## 2017-12-23 MED ORDER — SODIUM CHLORIDE 0.9 % IV BOLUS: 500.0000 mL | Freq: Once | INTRAVENOUS | Status: DC

## 2017-12-23 NOTE — ED Triage Notes (Addendum)
Pt c/o dizziness x 3 weeks-states "keep feeling like i'm gonna pass out"-states he was seen at the [facility] for c/o "but they didn't do nothing"-pt presents to triage steady gait with O2-pt states cards has him on heart monitor until June 16

## 2017-12-23 NOTE — ED Provider Notes (Signed)
Blood pressure (!) 183/77, pulse (!) 54, temperature 97.7 F (36.5 C), temperature source Oral, resp. rate 17, height 6\' 2"  (1.88 m), weight 95.6 kg (210 lb 12.2 oz), SpO2 (!) 88 %.  Assuming care from Dr. .  In short, [name] is a 77 y.o. male with a chief complaint of Dizziness .  Refer to the original H&P for additional details.  The current plan of care is to f/u repeat orthostatic vitals and ambulation trial.  04:40 PM Evaluated the patient.  He is feeling much better after fluids.  We discussed his lab testing and need for PCP follow-up next week.  Gust return precautions in detail.   EKG Interpretation  Date/Time:  Wednesday Dec 23 2017 12:10:59 EDT Ventricular Rate:  46 PR Interval:  232 QRS Duration: 140 QT Interval:  520 QTC Calculation: 455 R Axis:   25 Text Interpretation:  Sinus bradycardia with 1st degree A-V block Left bundle branch block Abnormal ECG since previous tracing, rate slower, now bradycardic Confirmed by [id number] [phone]) on 12/23/2017 1:59:52 PM      12/25/2017, MD   Long, Alona Bene, MD 12/23/17 [phone]

## 2017-12-23 NOTE — ED Provider Notes (Addendum)
[facility] EMERGENCY DEPARTMENT Provider Note   CSN: [id number] Arrival date & time: 12/23/17  1152     History   Chief Complaint Chief Complaint  Patient presents with  . Dizziness    HPI KEYGAN KINGERY is a 77 y.o. male.  77yo M w/ PMH including A fib, COPD w/ chronic respiratory failure on home O2, CAD who p/w dizziness. Pt has had 2-3 weeks of intermittent dizziness. He has been given a heart monitor by his cardiologist. He reports some lightheadedness with standing and walking. No room spinning sensation. He has more symptoms when he is outside working when it's hot; his symptoms are minimal when he's indoors. He tried to see his PCP today but they were all booked, so he came to ED instead. He denies any symptoms today. He denies any off balance feeling but legs do get weak during episodes. He denies any near-syncopal feeling. No headache or focal weakness. No CP or SOB. His BP medications were adjusted in February, after hospitalization for urosepsis. He has not noticed any blood in stool; he takes iron so his stool is always darker.  The history is provided by the patient.  Dizziness    Past Medical History:  Diagnosis Date  . Anxiety   . Aortic aneurysm (HCC) 09/02/2017  . BENIGN PROSTATIC HYPERTROPHY 11/23/2009  . Cardiomyopathy (HCC) 08/28/2016  . Chronic systolic CHF (congestive heart failure) (HCC) 09/02/2017  . COPD (chronic obstructive pulmonary disease) (HCC)   . CORONARY ARTERY DISEASE 11/23/2009  . 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 DEGENERATIVE JOINT DISEASE 11/23/2009  . DEPRESSION 11/23/2009  . FATIGUE 11/23/2009  . GAIT DISTURBANCE 12/10/2009  . HEMOPTYSIS UNSPECIFIED 05/07/2010  . High cholesterol   . HYPERTENSION 07/30/2009  . HYPOTHYROIDISM 07/30/2009  . Ischemic cardiomyopathy 09/02/2017  . 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 On home oxygen therapy    "2-3L; 24/7" (09/10/2016)  . OSA on CPAP   . PTSD (post-traumatic stress disorder)  03/10/2012  . 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 RA (rheumatoid arthritis) (HCC) 06/11/2011   "qwhere" (09/10/2016)  . RESPIRATORY FAILURE, CHRONIC 07/31/2009  . Scleritis of both eyes 03/17/2014  . 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 Type II diabetes mellitus [facility])     Patient Active Problem List   Diagnosis Date Noted  . Chronic respiratory failure with hypoxia (HCC) 12/01/2017  . Lung mass 12/01/2017  . Paroxysmal atrial fibrillation (HCC)   . Ischemic cardiomyopathy 09/02/2017  . Chronic systolic CHF (congestive heart failure) (HCC) 09/02/2017  . Aortic aneurysm (HCC) 09/02/2017  . Diabetes mellitus with complication, with long-term current use of insulin (HCC) 09/01/2017  . Anxiety and depression 09/01/2017  . COPD (chronic obstructive pulmonary disease) (HCC) 09/01/2017  . Hyperlipidemia 01/05/2014  . OSA on CPAP 06/10/2011  . Coronary atherosclerosis 11/23/2009  . Hypothyroidism 07/30/2009  . Essential hypertension 07/30/2009    Past Surgical History:  Procedure Laterality Date  . 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 ABDOMINAL EXPLORATION SURGERY  02/2004   w/LOA/notes 12/10/2010; small bowel obstruction repair with adhesiolysis   . BACK SURGERY    . CARDIAC CATHETERIZATION     2 heart caths in the past.  One in 2000s showed one ulcerated plaque  Rx medically; Second at [name] 09/05/2016  . CATARACT EXTRACTION W/ INTRAOCULAR LENS  IMPLANT, BILATERAL Bilateral 2000s  . COLECTOMY     hx of remote ileum resection due to bleeding  . CORONARY ANGIOPLASTY WITH STENT PLACEMENT  09/10/2016  . CORONARY STENT INTERVENTION N/A 09/10/2016   Procedure: Coronary Stent Intervention;  Surgeon: Kathleene Hazel, MD;  Location: [facility] INVASIVE CV LAB;  Service: Cardiovascular;  Laterality: N/A;  Distal RCA 4.0x16 Synergy  . FEMORAL EMBOLOECTOMY Left 07/2000   with left leg ischemia; Dr. Hart Rochester, vascular  . GANGLION CYST EXCISION Right    "wrist"; Dr. Teressa Senter  . LUMBAR  LAMINECTOMY  1972   Dr. Fannie Knee  . RIGHT/LEFT HEART CATH AND CORONARY ANGIOGRAPHY N/A 09/10/2016   Procedure: Right/Left Heart Cath and Coronary Angiography;  Surgeon: Kathleene Hazel, MD;  Location: [facility] INVASIVE CV LAB;  Service: Cardiovascular;  Laterality: N/A;  . TONSILLECTOMY          Home Medications    Prior to Admission medications   Medication Sig Start Date End Date Taking? Authorizing Provider  acetaminophen (TYLENOL) 325 MG tablet Take 650 mg by mouth daily as needed for moderate pain or headache.     [provider]  albuterol (PROVENTIL) (2.5 MG/3ML) 0.083% nebulizer solution 1 vial in nebulizer every 6 hours as needed Dx 496 Patient taking differently: Take 2.5 mg by nebulization every 6 (six) hours as needed for wheezing or shortness of breath. 1 vial in nebulizer every 6 hours as needed Dx 496 05/31/12   Kalman Shan, MD  ALPRAZolam Prudy Feeler) 0.5 MG tablet Take 0.5 mg by mouth 2 (two) times daily.    [provider]  amiodarone (PACERONE) 200 MG tablet TAKE 1 TABLET BY MOUTH TWICE DAILY FOR 2 WEEKS, THEN 1 TABLET DAILY 12/22/17   Carmelia Roller, Jilda Roche, DO  apixaban (ELIQUIS) 5 MG TABS tablet Take 1 tablet (5 mg total) by mouth 2 (two) times daily. 12/02/17   Lewayne Bunting, MD  aspirin EC 81 MG tablet Take 1 tablet (81 mg total) by mouth daily. 09/17/16   Bhagat, Sharrell Ku, PA  atorvastatin (LIPITOR) 40 MG tablet Take 1 tablet (40 mg total) by mouth daily. 10/21/16   Pricilla Riffle, MD  Carboxymethylcellul-Glycerin (LUBRICATING EYE DROPS OP) Apply 1 drop to eye daily as needed (dry eyes).    [provider]  carvedilol (COREG) 3.125 MG tablet Take 1 tablet (3.125 mg total) by mouth 2 (two) times daily with a meal. 12/02/17   Crenshaw, Madolyn Frieze, MD  Cholecalciferol (VITAMIN D3) 2000 units capsule Take 2,000 Units by mouth daily.  01/11/16   [provider]  ELIQUIS 5 MG TABS tablet TAKE 1 TABLET(5 MG) BY MOUTH TWICE DAILY 12/22/17    Wendling, Jilda Roche, DO  furosemide (LASIX) 40 MG tablet TK 1 T PO D PRN FOR FLUID OR EDEMA 09/11/16   [provider]  insulin glargine (LANTUS) 100 UNIT/ML injection Inject 0.4 mLs (40 Units total) into the skin at bedtime. 09/05/17   Rodolph Bong, MD  levothyroxine (SYNTHROID, LEVOTHROID) 125 MCG tablet Take 1.5 tablets (188 mcg total) by mouth daily before breakfast. 09/06/17   Rodolph Bong, MD  losartan (COZAAR) 100 MG tablet Take 1 tablet (100 mg total) by mouth daily. Patient taking differently: Take 50 mg by mouth daily.  09/06/17   Rodolph Bong, MD  magnesium (MAGTAB) 84 MG ( ) TBCR SR tablet Take 168 mg by mouth at bedtime.    [provider]  meclizine (ANTIVERT) 25 MG tablet TAKE 1 TABLET BY MOUTH THREE TIMES DAILY AS NEEDED FOR DIZZINESS 10/05/17   Sharlene Dory, DO  metFORMIN (GLUCOPHAGE-XR) 750 MG 24 hr tablet Take 750 mg by mouth 2 (  two) times daily.    [provider]  Multiple Vitamins-Minerals (MEGA MULTIVITAMIN FOR MEN PO) Take 1 tablet by mouth daily.     [provider]  OXYGEN Inhale 2 L into the lungs continuous.    [provider]  pregabalin (LYRICA) 50 MG capsule Take 50 mg by mouth 2 (two) times daily.    [provider]  Semaglutide (OZEMPIC) 0.25 or 0.5 MG/DOSE SOPN Inject 0.25-0.5 mg into the skin every 7 (seven) days.     [provider]  spironolactone (ALDACTONE) 25 MG tablet TAKE 1 TABLET(25 MG) BY MOUTH DAILY 11/25/17   Lewayne Bunting, MD  spironolactone (ALDACTONE) 25 MG tablet TAKE 1 TABLET(25 MG) BY MOUTH DAILY 12/22/17   Lewayne Bunting, MD  tamsulosin (FLOMAX) 0.4 MG CAPS capsule Take 1 capsule (0.4 mg total) by mouth at bedtime. 09/14/17   Sharlene Dory, DO  tiotropium (SPIRIVA HANDIHALER) 18 MCG inhalation capsule Place 1 capsule (18 mcg total) into inhaler and inhale daily. 07/16/17   Parrett, Virgel Bouquet, NP    Family History Family History  Problem  Relation Age of Onset  . Other Mother        gun shot    Social History Social History   Tobacco Use  . Smoking status: Former Smoker    Packs/day: 2.50    Years: 40.00    Pack years: 100.00    Types: Cigarettes, Pipe, Cigars    Last attempt to quit: 07/28/1998    Years since quitting: 19.4  . Smokeless tobacco: Never Used  Substance Use Topics  . Alcohol use: No    Alcohol/week: 0.0 oz  . Drug use: No     Allergies   Patient has no known allergies.   Review of Systems Review of Systems  Neurological: Positive for dizziness.   All other systems reviewed and are negative except that which was mentioned in HPI   Physical Exam Updated Vital Signs BP (!) 183/77 Comment: SpO@ monitor not reading accurately  Pulse (!) 54 Comment: SpO@ monitor not reading accurately  Temp 97.7 F (36.5 C) (Oral)   Resp 17 Comment: SpO@ monitor not reading accurately  Ht 6\' 2"  (1.88 m)   Wt 95.6 kg (210 lb 12.2 oz)   SpO2 (!) 88% Comment: SpO@ monitor not reading accurately  BMI 27.06 kg/m   Physical Exam  Constitutional: He is oriented to person, place, and time. He appears well-developed and well-nourished. No distress.  Frail, elderly man awake and comfortable  HENT:  Head: Normocephalic and atraumatic.  Moist mucous membranes  Eyes: Conjunctivae are normal.  Neck: Neck supple.  Cardiovascular: Regular rhythm and normal heart sounds. Bradycardia present.  No murmur heard. Pulmonary/Chest: Breath sounds normal.  Very mildly increased WOB  Abdominal: Soft. Bowel sounds are normal. He exhibits no distension. There is no tenderness.  Genitourinary:  Genitourinary Comments: No gross blood or melena on rectal exam  Musculoskeletal: He exhibits no edema.  Neurological: He is alert and oriented to person, place, and time.  Fluent speech  Skin: Skin is warm and dry.  Psychiatric: He has a normal mood and affect. Judgment normal.  Nursing note and vitals reviewed. Chaperone was  present during exam.    ED Treatments / Results  Labs (all labs ordered are listed, but only abnormal results are displayed) Labs Reviewed  COMPREHENSIVE METABOLIC PANEL - Abnormal; Notable for the following components:      Result Value   Potassium 5.4 (*)  Glucose, Bld 125 (*)    BUN 28 (*)    Creatinine, Ser 1.56 (*)    Calcium 8.7 (*)    GFR calc non Af Amer 41 (*)    GFR calc Af Amer 48 (*)    All other components within normal limits  CBC WITH DIFFERENTIAL/PLATELET - Abnormal; Notable for the following components:   RBC 3.50 (*)    Hemoglobin 9.9 (*)    HCT 30.2 (*)    RDW 15.8 (*)    Platelets 81 (*)    All other components within normal limits  OCCULT BLOOD X 1 CARD TO LAB, STOOL - Abnormal; Notable for the following components:   Fecal Occult Bld POSITIVE (*)    All other components within normal limits  URINALYSIS, ROUTINE W REFLEX MICROSCOPIC    EKG EKG Interpretation  Date/Time:  Wednesday Dec 23 2017 12:10:59 EDT Ventricular Rate:  46 PR Interval:  232 QRS Duration: 140 QT Interval:  520 QTC Calculation: 455 R Axis:   25 Text Interpretation:  Sinus bradycardia with 1st degree A-V block Left bundle branch block Abnormal ECG since previous tracing, rate slower, now bradycardic Confirmed by Frederick Peers [phone]) on 12/23/2017 1:59:52 PM   Radiology No results found.  Procedures Procedures (including critical care time)  Medications Ordered in ED Medications  sodium chloride 0.9 % bolus 1,000 mL (1,000 mLs Intravenous New Bag/Given 12/23/17 1444)    Orthostatic VS for the past 24 hrs:  BP- Lying Pulse- Lying BP- Sitting Pulse- Sitting BP- Standing at 0 minutes Pulse- Standing at 0 minutes  12/23/17 1412 182/87 (!) 47 148/75 (!) 44 134/63 60     Initial Impression / Assessment and Plan / ED Course  I have reviewed the triage vital signs and the nursing notes.  Pertinent labs & imaging results that were available during my care of the patient  were reviewed by me and considered in my medical decision making (see chart for details).     PT p/w 2 to 3 weeks of intermittent dizziness.  I reviewed his chart which shows correspondence between his PCP and his wife.  Wife noted that he has almost fallen over although patient downplays his symptoms for me. His BP was elevated while in bed but orthostatic with standing. Gave IVF bolus.  Labs show K 5.4, BUN 28, Cr 1.56 slightly elevated from previous. Hgb 9.9, slightly lower than previous but patient's baseline is 10-12. Hemoccult positive although no gross blood/melena. If his orthostasis were due to blood loss from GI bleed, I would expect a more significant drop in Hgb and his hypertension here certainly argues against acute blood loss. We will ambulate and repeat orthostatics after IVF. Pt signed out to oncoming provider pending these studies.  Final Clinical Impressions(s) / ED Diagnoses   Final diagnoses:  None    ED Discharge Orders    None       Geniva Lohnes, Ambrose Finland, MD 12/23/17 1555    Tyrell Brereton, Ambrose Finland, MD 12/23/17 [phone]

## 2017-12-23 NOTE — Discharge Instructions (Addendum)
SKIP SPIRONOLACTONE DOSE FOR 1 DAY. FOLLOW UP NEXT WEEK WITH PCP FOR RECHECK OF POTASSIUM AND KIDNEY FUNCTION.

## 2017-12-23 NOTE — ED Notes (Signed)
Pt feels better, denies dizziness, ready to go home

## 2017-12-23 NOTE — ED Notes (Signed)
ED Provider at bedside. 

## 2017-12-26 DIAGNOSIS — K5520 Angiodysplasia of colon without hemorrhage: Secondary | ICD-10-CM

## 2017-12-26 HISTORY — DX: Angiodysplasia of colon without hemorrhage: K55.20

## 2018-01-01 ENCOUNTER — Ambulatory Visit: Payer: Medicare Other | Admitting: *Deleted

## 2018-01-08 ENCOUNTER — Telehealth: Payer: Self-pay | Admitting: Family Medicine

## 2018-01-08 ENCOUNTER — Other Ambulatory Visit: Payer: Self-pay | Admitting: Family Medicine

## 2018-01-08 DIAGNOSIS — E876 Hypokalemia: Secondary | ICD-10-CM

## 2018-01-08 NOTE — Telephone Encounter (Signed)
Copied from CRM [id number]. Topic: General - Other >> Jan 08, 2018 11:39 AM Elliot Gault wrote: Caller name: [id number] - [place], [place] - [address] [phone] (Phone) [phone] (Fax)  Reason for call:  Pharmacy states kayexalate 15gm suspension is unavailable requesting powder form, (chart doesn't reflect) please advise   Need new directions for the powder per Pharmacist?? Powder comes in 15grams, advise

## 2018-01-08 NOTE — Telephone Encounter (Signed)
Called the patient and he stated he never took the Kayexalate at all. Do you still want him to come in for a recheck on the BMP

## 2018-01-08 NOTE — Telephone Encounter (Signed)
Yes. Want to make sure the potassium is OK. That's fine he never took. TY.

## 2018-01-08 NOTE — Telephone Encounter (Signed)
Tell pt sorry for confusion. He has a hx of hyperkalemia and I had ordered it in the past. Seeing a refill request made me think he had taken it before. I think it was a coincidence he was in ER and had high potassium. I would like to follow up on this potassium level though. Thanks.

## 2018-01-08 NOTE — Telephone Encounter (Signed)
I have placed the order and called the patient and scheduled the appt. He is confused as thought you had ordered this medication but he never did get it? Would like this explained at your convenience.

## 2018-01-08 NOTE — Telephone Encounter (Signed)
Let's hold off on refilling this until we recheck his labs. Please schedule nonfasting BMP on M or T. TY.

## 2018-01-11 ENCOUNTER — Other Ambulatory Visit: Payer: Self-pay | Admitting: Family Medicine

## 2018-01-11 ENCOUNTER — Telehealth: Payer: Self-pay | Admitting: Cardiology

## 2018-01-11 ENCOUNTER — Other Ambulatory Visit (INDEPENDENT_AMBULATORY_CARE_PROVIDER_SITE_OTHER): Payer: Medicare Other

## 2018-01-11 DIAGNOSIS — E876 Hypokalemia: Secondary | ICD-10-CM

## 2018-01-11 LAB — BASIC METABOLIC PANEL
BUN: 31 mg/dL — AB (ref 6–23)
CALCIUM: 9.1 mg/dL (ref 8.4–10.5)
CO2: 26 meq/L (ref 19–32)
Chloride: 108 mEq/L (ref 96–112)
Creatinine, Ser: 1.57 mg/dL — ABNORMAL HIGH (ref 0.40–1.50)
GFR: 45.84 mL/min — ABNORMAL LOW (ref >60.00)
GLUCOSE: 148 mg/dL — AB (ref 70–99)
POTASSIUM: 5.5 meq/L — AB (ref 3.5–5.1)
Sodium: 141 mEq/L (ref 135–145)

## 2018-01-11 NOTE — Telephone Encounter (Signed)
Spoke with pt, the dizziness he is having is the same as when he was seen. The monitor results are not available yet. The patient reports pushing the button with only 3 dizzy spells. He was also told to call us because his potassium was 5.5 when checked by PCP. Results are in Epic. He was told to cut the spironolactone in 1/2 and get in contact with [place]. He reports dizziness and being unable to stand when he goes out in the sun, he is fine in the home. Will forward for dr Jens Som review

## 2018-01-11 NOTE — Telephone Encounter (Signed)
Patient informed of PCP response. He did come in this am for that lab

## 2018-01-11 NOTE — Telephone Encounter (Signed)
DC spironolactone; bmet one week; await holter results Olga Millers

## 2018-01-11 NOTE — Telephone Encounter (Signed)
Spoke with pt, Aware of dr Ludwig Clarks recommendations. He has an appointment with his PCP Monday and will have recheck then.

## 2018-01-11 NOTE — Telephone Encounter (Signed)
New Message:        STAT if patient feels like he/she is going to faint   1) Are you dizzy now? No  2) Do you feel faint or have you passed out? Feeling faint  3) Do you have any other symptoms? Off balance  4) Have you checked your HR and BP (record if available)? No     Pt states he has labs done this morning and his potassium is high. Pt also states that every time he goes outside he gets dizzy and off balance.

## 2018-01-12 LAB — BASIC METABOLIC PANEL
BUN / CREAT RATIO: 21 (ref 10–24)
BUN: 30 mg/dL — AB (ref 8–27)
CO2: 20 mmol/L (ref 20–29)
CREATININE: 1.46 mg/dL — AB (ref 0.76–1.27)
Calcium: 9 mg/dL (ref 8.6–10.2)
Chloride: 107 mmol/L — ABNORMAL HIGH (ref 96–106)
GFR, EST AFRICAN AMERICAN: 53 mL/min/{1.73_m2} — AB (ref >59)
GFR, EST NON AFRICAN AMERICAN: 46 mL/min/{1.73_m2} — AB (ref >59)
GLUCOSE: 138 mg/dL — AB (ref 65–99)
Potassium: 5.2 mmol/L (ref 3.5–5.2)
Sodium: 142 mmol/L (ref 134–144)

## 2018-01-18 ENCOUNTER — Telehealth: Payer: Self-pay | Admitting: Family Medicine

## 2018-01-18 ENCOUNTER — Encounter: Payer: Self-pay | Admitting: Family Medicine

## 2018-01-18 ENCOUNTER — Other Ambulatory Visit: Payer: Self-pay | Admitting: Family Medicine

## 2018-01-18 ENCOUNTER — Ambulatory Visit: Payer: Medicare Other | Admitting: Family Medicine

## 2018-01-18 ENCOUNTER — Telehealth: Payer: Self-pay | Admitting: Gastroenterology

## 2018-01-18 VITALS — BP 110/62 | HR 65 | Temp 98.1°F | Ht 72.0 in | Wt 210.2 lb

## 2018-01-18 DIAGNOSIS — R748 Abnormal levels of other serum enzymes: Secondary | ICD-10-CM

## 2018-01-18 DIAGNOSIS — R42 Dizziness and giddiness: Secondary | ICD-10-CM | POA: Diagnosis not present

## 2018-01-18 DIAGNOSIS — R195 Other fecal abnormalities: Secondary | ICD-10-CM

## 2018-01-18 DIAGNOSIS — D649 Anemia, unspecified: Secondary | ICD-10-CM

## 2018-01-18 DIAGNOSIS — E875 Hyperkalemia: Secondary | ICD-10-CM | POA: Diagnosis not present

## 2018-01-18 DIAGNOSIS — D696 Thrombocytopenia, unspecified: Secondary | ICD-10-CM

## 2018-01-18 LAB — COMPREHENSIVE METABOLIC PANEL
ALBUMIN: 3.6 g/dL (ref 3.5–5.2)
ALK PHOS: 98 U/L (ref 39–117)
ALT: 18 U/L (ref 0–53)
AST: 22 U/L (ref 0–37)
BILIRUBIN TOTAL: 0.6 mg/dL (ref 0.2–1.2)
BUN: 27 mg/dL — ABNORMAL HIGH (ref 6–23)
CO2: 24 mEq/L (ref 19–32)
Calcium: 8.6 mg/dL (ref 8.4–10.5)
Chloride: 106 mEq/L (ref 96–112)
Creatinine, Ser: 1.74 mg/dL — ABNORMAL HIGH (ref 0.40–1.50)
GFR: 40.71 mL/min — AB (ref >60.00)
GLUCOSE: 108 mg/dL — AB (ref 70–99)
Potassium: 4.7 mEq/L (ref 3.5–5.1)
Sodium: 139 mEq/L (ref 135–145)
TOTAL PROTEIN: 5.6 g/dL — AB (ref 6.0–8.3)

## 2018-01-18 LAB — CBC
HCT: 24.5 % — ABNORMAL LOW (ref 39.0–52.0)
HEMOGLOBIN: 8.2 g/dL — AB (ref 13.0–17.0)
MCHC: 33.4 g/dL (ref 30.0–36.0)
MCV: 83.8 fl (ref 78.0–100.0)
RBC: 2.93 Mil/uL — AB (ref 4.22–5.81)
RDW: 15.6 % — AB (ref 11.5–15.5)
WBC: 4.1 10*3/uL (ref 4.0–10.5)

## 2018-01-18 LAB — TSH: TSH: 0.09 u[IU]/mL — AB (ref 0.35–4.50)

## 2018-01-18 NOTE — Patient Instructions (Signed)
If your labs are normal, we will get you set up with an ENT provider.   Let us know if you need anything.

## 2018-01-18 NOTE — Progress Notes (Signed)
Chief Complaint  Patient presents with  . Results    potassium    [name] is 77 y.o. pt here for dizziness.  Duration: 2 months Pass out? No Spinning? Yes Recent illness/fever? No Headache? No Neurologic signs? No Change in PO intake? Yes; poor appetite, nothing new over past couple mo though Went to ED for this issue, no improvement. Lasts for around 2-3 hours, does not seem to be associated with movements. No lightheadedness. Had chills, denies fevers, new SOB, new cough, urinary complaints. Denies blood in stool.   ROS:  Neuro: As noted in HPI Eyes: No vision changes  Past Medical History:  Diagnosis Date  . Anxiety   . Aortic aneurysm (HCC) 09/02/2017  . BENIGN PROSTATIC HYPERTROPHY 11/23/2009  . Cardiomyopathy (HCC) 08/28/2016  . Chronic systolic CHF (congestive heart failure) (HCC) 09/02/2017  . COPD (chronic obstructive pulmonary disease) (HCC)   . CORONARY ARTERY DISEASE 11/23/2009  . 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 DEGENERATIVE JOINT DISEASE 11/23/2009  . DEPRESSION 11/23/2009  . FATIGUE 11/23/2009  . GAIT DISTURBANCE 12/10/2009  . HEMOPTYSIS UNSPECIFIED 05/07/2010  . High cholesterol   . HYPERTENSION 07/30/2009  . HYPOTHYROIDISM 07/30/2009  . Ischemic cardiomyopathy 09/02/2017  . 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 On home oxygen therapy    "2-3L; 24/7" (09/10/2016)  . OSA on CPAP   . PTSD (post-traumatic stress disorder) 03/10/2012  . 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 RA (rheumatoid arthritis) (HCC) 06/11/2011   "qwhere" (09/10/2016)  . RESPIRATORY FAILURE, CHRONIC 07/31/2009  . Scleritis of both eyes 03/17/2014  . 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 Type II diabetes mellitus (HCC)     Family History  Problem Relation Age of Onset  . Other Mother        gun shot    Allergies as of 01/18/2018   No Known Allergies     Medication List        Accurate as of 01/18/18  8:48 AM. Always use your most recent med list.          acetaminophen 325 MG  tablet Commonly known as:  TYLENOL Take 650 mg by mouth daily as needed for moderate pain or headache.   albuterol (2.5 MG/3ML) 0.083% nebulizer solution Commonly known as:  PROVENTIL 1 vial in nebulizer every 6 hours as needed Dx 496   ALPRAZolam 0.5 MG tablet Commonly known as:  XANAX Take 0.5 mg by mouth 2 (two) times daily.   amiodarone 200 MG tablet Commonly known as:  PACERONE TAKE 1 TABLET BY MOUTH TWICE DAILY FOR 2 WEEKS, THEN 1 TABLET DAILY   apixaban 5 MG Tabs tablet Commonly known as:  ELIQUIS Take 1 tablet (5 mg total) by mouth 2 (two) times daily.   ELIQUIS 5 MG Tabs tablet Generic drug:  apixaban TAKE 1 TABLET(5 MG) BY MOUTH TWICE DAILY   aspirin EC 81 MG tablet Take 1 tablet (81 mg total) by mouth daily.   atorvastatin 40 MG tablet Commonly known as:  LIPITOR Take 1 tablet (40 mg total) by mouth daily.   carvedilol 3.125 MG tablet Commonly known as:  COREG Take 1 tablet (3.125 mg total) by mouth 2 (two) times daily with a meal.   FLOMAX 0.4 MG Caps capsule Generic drug:  tamsulosin Take 1 capsule (0.4 mg total) by mouth at bedtime.   furosemide 40 MG tablet Commonly known as:  LASIX TK 1 T PO D PRN FOR FLUID OR EDEMA   insulin glargine 100 UNIT/ML injection Commonly  known as:  LANTUS Inject 0.4 mLs (40 Units total) into the skin at bedtime.   levothyroxine 125 MCG tablet Commonly known as:  SYNTHROID, LEVOTHROID Take 1.5 tablets (188 mcg total) by mouth daily before breakfast.   losartan 100 MG tablet Commonly known as:  COZAAR Take 1 tablet (100 mg total) by mouth daily.   LUBRICATING EYE DROPS OP Apply 1 drop to eye daily as needed (dry eyes).   magnesium 84 MG ( ) Tbcr SR tablet Commonly known as:  MAGTAB Take 168 mg by mouth at bedtime.   meclizine 25 MG tablet Commonly known as:  ANTIVERT TAKE 1 TABLET BY MOUTH THREE TIMES DAILY AS NEEDED FOR DIZZINESS   MEGA MULTIVITAMIN FOR MEN PO Take 1 tablet by mouth daily.   metFORMIN  750 MG 24 hr tablet Commonly known as:  GLUCOPHAGE-XR Take 750 mg by mouth 2 (two) times daily.   OXYGEN Inhale 2 L into the lungs continuous.   OZEMPIC 0.25 or 0.5 MG/DOSE Sopn Generic drug:  Semaglutide Inject 0.25-0.5 mg into the skin every 7 (seven) days.   pregabalin 50 MG capsule Commonly known as:  LYRICA Take 50 mg by mouth 2 (two) times daily.   tiotropium 18 MCG inhalation capsule Commonly known as:  SPIRIVA HANDIHALER Place 1 capsule (18 mcg total) into inhaler and inhale daily.   Vitamin D3 2000 units capsule Take 2,000 Units by mouth daily.       BP 110/62 (BP Location: Left Arm, Patient Position: Sitting, Cuff Size: Large)   Pulse 65   Temp 98.1 F (36.7 C) (Oral)   Ht 6' (1.829 m)   Wt 210 lb 4 oz (95.4 kg)   SpO2 95%   BMI 28.52 kg/m  General: Awake, alert, appears stated age Eyes: PERRLA, EOMi Ears: Patent, TM's neg b/l Heart: RRR, no murmurs, no carotid bruits Lungs: CTAB, no accessory muscle use MSK: 5/5 strength throughout, gait normal Neuro: No cerebellar signs, patellar reflex 1/4 b/l wo clonus, calcaneal reflex 0/4 b/l wo clonus, biceps reflex 1/4 b/l wo clonus; Dix-Hall-Pike negative b/l. Psych: Age appropriate judgment and insight, normal mood and affect  Dizziness - Plan: CBC, Comprehensive metabolic panel, TSH  Serum potassium elevated  Orders as above. If unremarkable, will refer to ENT for further evaluation.  F/u prn. Pt voiced understanding and agreement to the plan.  Jilda Roche [place], DO 01/18/18 8:48 AM

## 2018-01-18 NOTE — Telephone Encounter (Signed)
CRITICAL VALUE STICKER  CRITICAL VALUE:  Platelets 34  RECEIVER (Traci Plemons DATE & TIME NOTIFIED: 01/18/2018 at 12:30 PM  MESSENGER ([facility]):  MD NOTIFIED: Yes --- Dr. Carmelia Roller  TIME OF NOTIFICATION:  12:32pm  RESPONSE: ok

## 2018-01-18 NOTE — Progress Notes (Signed)
Pre visit review using our clinic review tool, if applicable. No additional management support is needed unless otherwise documented below in the visit note. 

## 2018-01-19 NOTE — Telephone Encounter (Signed)
He need first available appointment with any provider (MD or extender). thanks

## 2018-01-19 NOTE — Telephone Encounter (Signed)
Dr Christella Hartigan please review Hgb is 8.2 trending down, 3 weeks ago 9.9 and 4 months ago 11.1.  Saw Dr Carmelia Roller on 6/24.  He is on Eliquis and presented with dizziness for the past 2 months.  Please advise extender appointment or ED?

## 2018-01-20 ENCOUNTER — Encounter: Payer: Self-pay | Admitting: Family Medicine

## 2018-01-20 ENCOUNTER — Encounter (HOSPITAL_COMMUNITY): Payer: Self-pay

## 2018-01-20 ENCOUNTER — Other Ambulatory Visit: Payer: Self-pay

## 2018-01-20 ENCOUNTER — Ambulatory Visit (INDEPENDENT_AMBULATORY_CARE_PROVIDER_SITE_OTHER): Payer: Medicare Other | Admitting: Family Medicine

## 2018-01-20 ENCOUNTER — Inpatient Hospital Stay (HOSPITAL_BASED_OUTPATIENT_CLINIC_OR_DEPARTMENT_OTHER)
Admission: EM | Admit: 2018-01-20 | Discharge: 2018-01-24 | DRG: 811 | Disposition: A | Discharge status: Home / Self Care | Payer: Medicare Other | Attending: Internal Medicine | Admitting: Internal Medicine

## 2018-01-20 ENCOUNTER — Other Ambulatory Visit (INDEPENDENT_AMBULATORY_CARE_PROVIDER_SITE_OTHER): Payer: Medicare Other

## 2018-01-20 ENCOUNTER — Inpatient Hospital Stay (HOSPITAL_COMMUNITY): Payer: Medicare Other

## 2018-01-20 ENCOUNTER — Encounter (HOSPITAL_BASED_OUTPATIENT_CLINIC_OR_DEPARTMENT_OTHER): Payer: Self-pay | Admitting: Emergency Medicine

## 2018-01-20 ENCOUNTER — Emergency Department (HOSPITAL_BASED_OUTPATIENT_CLINIC_OR_DEPARTMENT_OTHER): Payer: Medicare Other

## 2018-01-20 ENCOUNTER — Telehealth: Payer: Self-pay | Admitting: Family Medicine

## 2018-01-20 VITALS — BP 100/60 | HR 67 | Temp 99.0°F | Ht 72.0 in | Wt 210.0 lb

## 2018-01-20 DIAGNOSIS — K5730 Diverticulosis of large intestine without perforation or abscess without bleeding: Secondary | ICD-10-CM | POA: Diagnosis not present

## 2018-01-20 DIAGNOSIS — K8020 Calculus of gallbladder without cholecystitis without obstruction: Secondary | ICD-10-CM | POA: Diagnosis present

## 2018-01-20 DIAGNOSIS — Z9981 Dependence on supplemental oxygen: Secondary | ICD-10-CM

## 2018-01-20 DIAGNOSIS — J449 Chronic obstructive pulmonary disease, unspecified: Secondary | ICD-10-CM | POA: Diagnosis present

## 2018-01-20 DIAGNOSIS — I959 Hypotension, unspecified: Secondary | ICD-10-CM | POA: Diagnosis not present

## 2018-01-20 DIAGNOSIS — J8410 Pulmonary fibrosis, unspecified: Secondary | ICD-10-CM | POA: Diagnosis present

## 2018-01-20 DIAGNOSIS — G4733 Obstructive sleep apnea (adult) (pediatric): Secondary | ICD-10-CM | POA: Diagnosis present

## 2018-01-20 DIAGNOSIS — R748 Abnormal levels of other serum enzymes: Secondary | ICD-10-CM

## 2018-01-20 DIAGNOSIS — N183 Chronic kidney disease, stage 3 (moderate): Secondary | ICD-10-CM | POA: Diagnosis present

## 2018-01-20 DIAGNOSIS — D500 Iron deficiency anemia secondary to blood loss (chronic): Secondary | ICD-10-CM | POA: Diagnosis present

## 2018-01-20 DIAGNOSIS — R531 Weakness: Secondary | ICD-10-CM | POA: Diagnosis present

## 2018-01-20 DIAGNOSIS — N189 Chronic kidney disease, unspecified: Secondary | ICD-10-CM | POA: Diagnosis not present

## 2018-01-20 DIAGNOSIS — I4891 Unspecified atrial fibrillation: Secondary | ICD-10-CM | POA: Diagnosis not present

## 2018-01-20 DIAGNOSIS — I2510 Atherosclerotic heart disease of native coronary artery without angina pectoris: Secondary | ICD-10-CM | POA: Diagnosis present

## 2018-01-20 DIAGNOSIS — F419 Anxiety disorder, unspecified: Secondary | ICD-10-CM | POA: Diagnosis present

## 2018-01-20 DIAGNOSIS — I255 Ischemic cardiomyopathy: Secondary | ICD-10-CM | POA: Diagnosis present

## 2018-01-20 DIAGNOSIS — K649 Unspecified hemorrhoids: Secondary | ICD-10-CM | POA: Diagnosis not present

## 2018-01-20 DIAGNOSIS — I5022 Chronic systolic (congestive) heart failure: Secondary | ICD-10-CM | POA: Diagnosis not present

## 2018-01-20 DIAGNOSIS — D696 Thrombocytopenia, unspecified: Secondary | ICD-10-CM | POA: Diagnosis present

## 2018-01-20 DIAGNOSIS — K5520 Angiodysplasia of colon without hemorrhage: Secondary | ICD-10-CM | POA: Diagnosis present

## 2018-01-20 DIAGNOSIS — E039 Hypothyroidism, unspecified: Secondary | ICD-10-CM | POA: Diagnosis present

## 2018-01-20 DIAGNOSIS — K922 Gastrointestinal hemorrhage, unspecified: Secondary | ICD-10-CM | POA: Diagnosis not present

## 2018-01-20 DIAGNOSIS — F418 Other specified anxiety disorders: Secondary | ICD-10-CM | POA: Diagnosis not present

## 2018-01-20 DIAGNOSIS — D649 Anemia, unspecified: Secondary | ICD-10-CM | POA: Diagnosis present

## 2018-01-20 DIAGNOSIS — Z79899 Other long term (current) drug therapy: Secondary | ICD-10-CM

## 2018-01-20 DIAGNOSIS — N400 Enlarged prostate without lower urinary tract symptoms: Secondary | ICD-10-CM | POA: Diagnosis present

## 2018-01-20 DIAGNOSIS — I5042 Chronic combined systolic (congestive) and diastolic (congestive) heart failure: Secondary | ICD-10-CM | POA: Diagnosis present

## 2018-01-20 DIAGNOSIS — I130 Hypertensive heart and chronic kidney disease with heart failure and stage 1 through stage 4 chronic kidney disease, or unspecified chronic kidney disease: Secondary | ICD-10-CM | POA: Diagnosis present

## 2018-01-20 DIAGNOSIS — I480 Paroxysmal atrial fibrillation: Secondary | ICD-10-CM | POA: Diagnosis present

## 2018-01-20 DIAGNOSIS — Z7982 Long term (current) use of aspirin: Secondary | ICD-10-CM

## 2018-01-20 DIAGNOSIS — R740 Nonspecific elevation of levels of transaminase and lactic acid dehydrogenase [LDH]: Secondary | ICD-10-CM | POA: Diagnosis not present

## 2018-01-20 DIAGNOSIS — R195 Other fecal abnormalities: Secondary | ICD-10-CM

## 2018-01-20 DIAGNOSIS — D508 Other iron deficiency anemias: Secondary | ICD-10-CM | POA: Diagnosis not present

## 2018-01-20 DIAGNOSIS — I129 Hypertensive chronic kidney disease with stage 1 through stage 4 chronic kidney disease, or unspecified chronic kidney disease: Secondary | ICD-10-CM | POA: Diagnosis not present

## 2018-01-20 DIAGNOSIS — E1122 Type 2 diabetes mellitus with diabetic chronic kidney disease: Secondary | ICD-10-CM | POA: Diagnosis present

## 2018-01-20 DIAGNOSIS — M069 Rheumatoid arthritis, unspecified: Secondary | ICD-10-CM | POA: Diagnosis present

## 2018-01-20 DIAGNOSIS — Z87891 Personal history of nicotine dependence: Secondary | ICD-10-CM

## 2018-01-20 DIAGNOSIS — Z794 Long term (current) use of insulin: Secondary | ICD-10-CM

## 2018-01-20 DIAGNOSIS — F4310 Post-traumatic stress disorder, unspecified: Secondary | ICD-10-CM | POA: Diagnosis present

## 2018-01-20 DIAGNOSIS — E119 Type 2 diabetes mellitus without complications: Secondary | ICD-10-CM | POA: Diagnosis not present

## 2018-01-20 DIAGNOSIS — N179 Acute kidney failure, unspecified: Secondary | ICD-10-CM | POA: Diagnosis present

## 2018-01-20 DIAGNOSIS — I509 Heart failure, unspecified: Secondary | ICD-10-CM | POA: Diagnosis not present

## 2018-01-20 DIAGNOSIS — D693 Immune thrombocytopenic purpura: Secondary | ICD-10-CM | POA: Diagnosis present

## 2018-01-20 DIAGNOSIS — R42 Dizziness and giddiness: Secondary | ICD-10-CM

## 2018-01-20 DIAGNOSIS — D631 Anemia in chronic kidney disease: Secondary | ICD-10-CM | POA: Diagnosis present

## 2018-01-20 DIAGNOSIS — E785 Hyperlipidemia, unspecified: Secondary | ICD-10-CM | POA: Diagnosis present

## 2018-01-20 DIAGNOSIS — Z9989 Dependence on other enabling machines and devices: Secondary | ICD-10-CM

## 2018-01-20 DIAGNOSIS — J9621 Acute and chronic respiratory failure with hypoxia: Secondary | ICD-10-CM | POA: Diagnosis present

## 2018-01-20 DIAGNOSIS — Z955 Presence of coronary angioplasty implant and graft: Secondary | ICD-10-CM

## 2018-01-20 DIAGNOSIS — R7401 Elevation of levels of liver transaminase levels: Secondary | ICD-10-CM

## 2018-01-20 DIAGNOSIS — Z7901 Long term (current) use of anticoagulants: Secondary | ICD-10-CM

## 2018-01-20 LAB — COMPREHENSIVE METABOLIC PANEL
ALBUMIN: 3.3 g/dL — AB (ref 3.5–5.0)
ALT: 27 U/L (ref 0–44)
AST: 35 U/L (ref 15–41)
Alkaline Phosphatase: 157 U/L — ABNORMAL HIGH (ref 38–126)
Anion gap: 8 (ref 5–15)
BUN: 28 mg/dL — AB (ref 8–23)
CHLORIDE: 107 mmol/L (ref 98–111)
CO2: 24 mmol/L (ref 22–32)
Calcium: 8.2 mg/dL — ABNORMAL LOW (ref 8.9–10.3)
Creatinine, Ser: 1.87 mg/dL — ABNORMAL HIGH (ref 0.61–1.24)
GFR, EST AFRICAN AMERICAN: 39 mL/min — AB (ref >60)
GFR, EST NON AFRICAN AMERICAN: 33 mL/min — AB (ref >60)
Glucose, Bld: 102 mg/dL — ABNORMAL HIGH (ref 70–99)
Potassium: 5.1 mmol/L (ref 3.5–5.1)
Sodium: 139 mmol/L (ref 135–145)
TOTAL PROTEIN: 5.9 g/dL — AB (ref 6.5–8.1)
Total Bilirubin: 0.8 mg/dL (ref 0.3–1.2)

## 2018-01-20 LAB — URINALYSIS, ROUTINE W REFLEX MICROSCOPIC
BILIRUBIN URINE: NEGATIVE
Glucose, UA: NEGATIVE mg/dL
Hgb urine dipstick: NEGATIVE
KETONES UR: NEGATIVE mg/dL
Leukocytes, UA: NEGATIVE
Nitrite: NEGATIVE
PH: 5.5 (ref 5.0–8.0)
Protein, ur: NEGATIVE mg/dL
SPECIFIC GRAVITY, URINE: 1.015 (ref 1.005–1.030)

## 2018-01-20 LAB — BASIC METABOLIC PANEL
BUN: 27 mg/dL — ABNORMAL HIGH (ref 6–23)
CO2: 26 meq/L (ref 19–32)
Calcium: 8.9 mg/dL (ref 8.4–10.5)
Chloride: 107 mEq/L (ref 96–112)
Creatinine, Ser: 1.79 mg/dL — ABNORMAL HIGH (ref 0.40–1.50)
GFR: 39.4 mL/min — AB (ref >60.00)
Glucose, Bld: 122 mg/dL — ABNORMAL HIGH (ref 70–99)
POTASSIUM: 5.3 meq/L — AB (ref 3.5–5.1)
SODIUM: 139 meq/L (ref 135–145)

## 2018-01-20 LAB — CBC WITH DIFFERENTIAL/PLATELET
BASOS ABS: 0 10*3/uL (ref 0.0–0.1)
Basophils Relative: 0 %
Eosinophils Absolute: 0.2 10*3/uL (ref 0.0–0.7)
Eosinophils Relative: 4 %
HCT: 24.3 % — ABNORMAL LOW (ref 39.0–52.0)
HEMOGLOBIN: 7.8 g/dL — AB (ref 13.0–17.0)
LYMPHS ABS: 0.8 10*3/uL (ref 0.7–4.0)
Lymphocytes Relative: 19 %
MCH: 27.4 pg (ref 26.0–34.0)
MCHC: 32.1 g/dL (ref 30.0–36.0)
MCV: 85.3 fL (ref 78.0–100.0)
MONOS PCT: 15 %
Monocytes Absolute: 0.6 10*3/uL (ref 0.1–1.0)
NEUTROS ABS: 2.5 10*3/uL (ref 1.7–7.7)
Neutrophils Relative %: 62 %
Platelets: 48 10*3/uL — ABNORMAL LOW (ref 150–400)
RBC: 2.85 MIL/uL — ABNORMAL LOW (ref 4.22–5.81)
RDW: 14.9 % (ref 11.5–15.5)
WBC: 4.1 10*3/uL (ref 4.0–10.5)

## 2018-01-20 LAB — PREPARE RBC (CROSSMATCH)

## 2018-01-20 LAB — ABO/RH: ABO/RH(D): O POS

## 2018-01-20 LAB — LIPASE, BLOOD: LIPASE: 46 U/L (ref 11–51)

## 2018-01-20 LAB — T4, FREE: FREE T4: 1.79 ng/dL — AB (ref 0.60–1.60)

## 2018-01-20 MED ORDER — ACETAMINOPHEN 325 MG PO TABS: 650.0000 mg | ORAL_TABLET | Freq: Every day | ORAL | Status: DC | PRN

## 2018-01-20 MED ORDER — ORAL CARE MOUTH RINSE: 15.0000 mL | Freq: Two times a day (BID) | OROMUCOSAL | Status: DC

## 2018-01-20 MED ORDER — TAMSULOSIN HCL 0.4 MG PO CAPS
0.4000 mg | ORAL_CAPSULE | Freq: Every day | ORAL | Status: DC
  Administered 2018-01-20 – 2018-01-23 (×4): 0.4 mg via ORAL
  Filled 2018-01-20 (×4): qty 1

## 2018-01-20 MED ORDER — LEVOTHYROXINE SODIUM 125 MCG PO TABS
125.0000 ug | ORAL_TABLET | Freq: Every day | ORAL | Status: DC
  Administered 2018-01-21 – 2018-01-24 (×4): 125 ug via ORAL
  Filled 2018-01-20 (×4): qty 1

## 2018-01-20 MED ORDER — FUROSEMIDE 10 MG/ML IJ SOLN
20.0000 mg | Freq: Once | INTRAMUSCULAR | Status: AC
  Administered 2018-01-21: 20 mg via INTRAVENOUS
  Filled 2018-01-20: qty 2

## 2018-01-20 MED ORDER — ONDANSETRON HCL 4 MG/2ML IJ SOLN: 4.0000 mg | Freq: Four times a day (QID) | INTRAMUSCULAR | Status: DC | PRN

## 2018-01-20 MED ORDER — PREGABALIN 50 MG PO CAPS
50.0000 mg | ORAL_CAPSULE | Freq: Two times a day (BID) | ORAL | Status: DC
  Administered 2018-01-20 – 2018-01-24 (×8): 50 mg via ORAL
  Filled 2018-01-20 (×8): qty 1

## 2018-01-20 MED ORDER — CHLORHEXIDINE GLUCONATE 0.12 % MT SOLN: 15.0000 mL | Freq: Two times a day (BID) | OROMUCOSAL | Status: DC

## 2018-01-20 MED ORDER — INSULIN GLARGINE 100 UNIT/ML ~~LOC~~ SOLN
45.0000 [IU] | Freq: Every day | SUBCUTANEOUS | Status: DC
  Administered 2018-01-20 – 2018-01-23 (×4): 45 [IU] via SUBCUTANEOUS
  Filled 2018-01-20 (×5): qty 0.45

## 2018-01-20 MED ORDER — ALBUTEROL SULFATE (2.5 MG/3ML) 0.083% IN NEBU: 2.5000 mg | INHALATION_SOLUTION | Freq: Four times a day (QID) | RESPIRATORY_TRACT | Status: DC | PRN

## 2018-01-20 MED ORDER — ONDANSETRON HCL 4 MG PO TABS: 4.0000 mg | ORAL_TABLET | Freq: Four times a day (QID) | ORAL | Status: DC | PRN

## 2018-01-20 MED ORDER — SODIUM CHLORIDE 0.9% IV SOLUTION: Freq: Once | INTRAVENOUS | Status: DC

## 2018-01-20 MED ORDER — INSULIN ASPART 100 UNIT/ML ~~LOC~~ SOLN
0.0000 [IU] | Freq: Three times a day (TID) | SUBCUTANEOUS | Status: DC
  Administered 2018-01-21: 2 [IU] via SUBCUTANEOUS
  Administered 2018-01-21 – 2018-01-22 (×2): 3 [IU] via SUBCUTANEOUS
  Administered 2018-01-23 – 2018-01-24 (×4): 2 [IU] via SUBCUTANEOUS

## 2018-01-20 MED ORDER — AMIODARONE HCL 200 MG PO TABS
200.0000 mg | ORAL_TABLET | Freq: Every day | ORAL | Status: DC
  Administered 2018-01-20 – 2018-01-24 (×5): 200 mg via ORAL
  Filled 2018-01-20 (×5): qty 1

## 2018-01-20 MED ORDER — ATORVASTATIN CALCIUM 40 MG PO TABS
40.0000 mg | ORAL_TABLET | Freq: Every day | ORAL | Status: DC
Start: 2018-01-20 — End: 2018-01-24
  Administered 2018-01-20 – 2018-01-24 (×5): 40 mg via ORAL
  Filled 2018-01-20 (×5): qty 1

## 2018-01-20 MED ORDER — TIOTROPIUM BROMIDE MONOHYDRATE 18 MCG IN CAPS
18.0000 ug | ORAL_CAPSULE | Freq: Every day | RESPIRATORY_TRACT | Status: DC
  Administered 2018-01-21 – 2018-01-24 (×4): 18 ug via RESPIRATORY_TRACT
  Filled 2018-01-20: qty 5

## 2018-01-20 MED ORDER — ALPRAZOLAM 0.5 MG PO TABS
0.5000 mg | ORAL_TABLET | Freq: Every day | ORAL | Status: DC
  Administered 2018-01-20 – 2018-01-23 (×4): 0.5 mg via ORAL
  Filled 2018-01-20 (×4): qty 1

## 2018-01-20 MED ORDER — CARVEDILOL 3.125 MG PO TABS
3.1250 mg | ORAL_TABLET | Freq: Two times a day (BID) | ORAL | Status: DC
  Administered 2018-01-20 – 2018-01-23 (×5): 3.125 mg via ORAL
  Filled 2018-01-20 (×5): qty 1

## 2018-01-20 MED ORDER — LOSARTAN POTASSIUM 50 MG PO TABS
50.0000 mg | ORAL_TABLET | Freq: Every day | ORAL | Status: DC
Start: 2018-01-20 — End: 2018-01-23
  Administered 2018-01-20 – 2018-01-23 (×4): 50 mg via ORAL
  Filled 2018-01-20 (×4): qty 1

## 2018-01-20 MED ORDER — ACETAMINOPHEN 325 MG PO TABS
650.0000 mg | ORAL_TABLET | Freq: Four times a day (QID) | ORAL | Status: DC | PRN
Start: 2018-01-20 — End: 2018-01-24

## 2018-01-20 NOTE — Progress Notes (Signed)
Chief Complaint  Patient presents with  . Dizziness    Patient is no better, but feeling worse.  Would like to be evaluated and if needed sent to the hospital.    Subjective: Patient is a 77 y.o. male here for f/u dizziness.  Patient was seen 2 days ago for long-standing dizziness.  He reports getting worse today.  His blood count did show worsening anemia and thrombocytopenia.  It was found to 3 weeks ago, he was heme positive in the emergency department.  Because his hemoglobin was stable, no gastroenterology consult was made.  We did set him up with gastroenterology, but they cannot get him in until next Monday, and 5 days.  He denies seeing any blood in the stool or bleeding anywhere else.  Denies fevers, chest pain or worsening shortness of breath.   ROS: Heart: Denies chest pain  Lungs: Denies SOB   Past Medical History:  Diagnosis Date  . Anxiety   . Aortic aneurysm (HCC) 09/02/2017  . BENIGN PROSTATIC HYPERTROPHY 11/23/2009  . Cardiomyopathy (HCC) 08/28/2016  . Chronic systolic CHF (congestive heart failure) (HCC) 09/02/2017  . COPD (chronic obstructive pulmonary disease) (HCC)   . CORONARY ARTERY DISEASE 11/23/2009  . 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 DEGENERATIVE JOINT DISEASE 11/23/2009  . DEPRESSION 11/23/2009  . FATIGUE 11/23/2009  . GAIT DISTURBANCE 12/10/2009  . HEMOPTYSIS UNSPECIFIED 05/07/2010  . High cholesterol   . HYPERTENSION 07/30/2009  . HYPOTHYROIDISM 07/30/2009  . Ischemic cardiomyopathy 09/02/2017  . 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 On home oxygen therapy    "2-3L; 24/7" (09/10/2016)  . OSA on CPAP   . PTSD (post-traumatic stress disorder) 03/10/2012  . 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 RA (rheumatoid arthritis) (HCC) 06/11/2011   "qwhere" (09/10/2016)  . RESPIRATORY FAILURE, CHRONIC 07/31/2009  . Scleritis of both eyes 03/17/2014  . 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 Type II diabetes mellitus (HCC)     Objective: BP 100/60 (BP Location: Left Arm, Patient Position:  Sitting, Cuff Size: Normal)   Pulse 67   Temp 99 F (37.2 C) (Oral)   Ht 6' (1.829 m)   Wt 210 lb (95.3 kg)   SpO2 95%   BMI 28.48 kg/m  General: Awake, appears stated age Heart: RRR Lungs: CTAB, no rales, wheezes or rhonchi. No accessory muscle use Psych: Age appropriate judgment and insight, normal affect and mood  Assessment and Plan: Symptomatic anemia  Heme positive stool  Given his worsening symptoms, heme positive stool, and worsening anemia, I would like to send him downstairs for symptomatic anemia. Follow-up pending his work-up in the emergency department. The patient voiced understanding and agreement to the plan.  Jilda Roche [place], DO 01/20/18  12:14 PM

## 2018-01-20 NOTE — H&P (Signed)
History and Physical    [name]:[id number] DOB: 28-Feb-1941  DOA: 01/20/2018 PCP: Sharlene Dory, DO  Patient coming from: Home  Chief Complaint: Low hemoglobin  HPI: [name] is a 77 y.o. 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 Presents to the hospital for management of low hemoglobin levels. He complains of intermittent dizzy spells with weakness and fatigue for the past 3 months. States these spell come on at random with no inciting events. He reports associated nausea at the time of the episode, but denies chest pain, palpitations, SOB, vomiting. He feels as if he will lose his balance, however he denies any falls. He also complains of dark, loose stool for the past month but denies frank blood in his stool. Last BM was yesterday and was dark and loose. He recently had a halter monitor for 1 month that he reports his cardiologist gave him to rule out "a heart problem." He was evaluated by his PCP this morning and was found to have worsening anemia with hemoglobin 8.2 two days ago (down from 9.9 1 month ago) and hemepositive stool, so he was sent to the ED to be evaluated for symptomatic anemia.  ED Course: CBC showed Hgb 7.8, Hct 24.3, MCV 85.3, Plt 48, stool guaiac positive. CT scan showed some chronic atrophic and ischemic changes with recent subacute ischemia noted in the right parietal lobe. Patient admitted to the hospital for transfusion with the working diagnosis of symptomatic anemia.  Review of Systems:   General: Reports weight loss, fatigue. Denies fever, chills.  HEENT: no vision changes, no hearing changes, or sore throat Respiratory: no dyspnea, coughing, wheezing CV: no  chest pain, no palpitations GI: Some nausea with dizzy episodes, complains of loose stool. Denies vomiting, abdominal pain, constipation GU: no dysuria, burning on urination, increased urinary frequency, hematuria  Neuro: no unilateral weakness, numbness, or tingling, no vision change or hearing loss Skin: No rashes, lesions or wounds.   Past Medical History:  Diagnosis Date  . Anxiety   . Aortic aneurysm (HCC) 09/02/2017  . BENIGN PROSTATIC HYPERTROPHY 11/23/2009  . Cardiomyopathy (HCC) 08/28/2016  . Chronic systolic CHF (congestive heart failure) (HCC) 09/02/2017  . COPD (chronic obstructive pulmonary disease) (HCC)   . CORONARY ARTERY DISEASE 11/23/2009  . 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 DEGENERATIVE JOINT DISEASE 11/23/2009  . DEPRESSION 11/23/2009  . FATIGUE 11/23/2009  . GAIT DISTURBANCE 12/10/2009  . HEMOPTYSIS UNSPECIFIED 05/07/2010  . High cholesterol   . HYPERTENSION 07/30/2009  . HYPOTHYROIDISM 07/30/2009  . Ischemic cardiomyopathy 09/02/2017  . 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 On home oxygen therapy    "2-3L; 24/7" (09/10/2016)  . OSA on CPAP   . PTSD (post-traumatic stress disorder) 03/10/2012  . 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 RA (rheumatoid arthritis) (HCC) 06/11/2011   "qwhere" (09/10/2016)  . RESPIRATORY FAILURE, CHRONIC 07/31/2009  . Scleritis of both eyes 03/17/2014  . 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 Type II diabetes mellitus (HCC)     Past Surgical History:  Procedure Laterality Date  . 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 ABDOMINAL EXPLORATION SURGERY  02/2004   w/LOA/notes 12/10/2010; small bowel obstruction repair with adhesiolysis   . BACK SURGERY    .  CARDIAC CATHETERIZATION     2 heart caths in the past.  One in 2000s showed one ulcerated plaque  Rx medically; Second at [facility] [name] 09/05/2016  . CATARACT EXTRACTION W/ INTRAOCULAR LENS  IMPLANT, BILATERAL Bilateral 2000s  . COLECTOMY     hx of remote ileum resection due to bleeding  .  CORONARY ANGIOPLASTY WITH STENT PLACEMENT  09/10/2016  . CORONARY STENT INTERVENTION N/A 09/10/2016   Procedure: Coronary Stent Intervention;  Surgeon: Kathleene Hazel, MD;  Location[facility] INVASIVE CV LAB;  Service: Cardiovascular;  Laterality: N/A;  Distal RCA 4.0x16 Synergy  . FEMORAL EMBOLOECTOMY Left 07/2000   with left leg ischemia; Dr. Hart Rochester, vascular  . GANGLION CYST EXCISION Right    "wrist"; Dr. Teressa Senter  . LUMBAR LAMINECTOMY  1972   Dr. Fannie Knee  . RIGHT/LEFT HEART CATH AND CORONARY ANGIOGRAPHY N/A 09/10/2016   Procedure: Right/Left Heart Cath and Coronary Angiography;  Surgeon: Kathleene Hazel, MD;  Location: [facility] INVASIVE CV LAB;  Service: Cardiovascular;  Laterality: N/A;  . TONSILLECTOMY       reports that he quit smoking about 19 years ago. His smoking use included cigarettes, pipe, and cigars. He has a 100.00 pack-year smoking history. He has never used smokeless tobacco. He reports that he does not drink alcohol or use drugs.  Allergies  Allergen Reactions  . No Known Allergies     Family History  Problem Relation Age of Onset  . Other Mother        gun shot    Prior to Admission medications   Medication Sig Start Date End Date Taking? Authorizing Provider  acetaminophen (TYLENOL) 325 MG tablet Take 650 mg by mouth daily as needed for moderate pain or headache.    Yes [provider]  ALPRAZolam Prudy Feeler) 0.5 MG tablet Take 0.5 mg by mouth at bedtime.    Yes [provider]  amiodarone (PACERONE) 200 MG tablet TAKE 1 TABLET BY MOUTH TWICE DAILY FOR 2 WEEKS, THEN 1 TABLET DAILY Patient taking differently: TAKE 1 TABLET BY MOUTH DAILY 01/18/18  Yes Wendling, Jilda Roche, DO  apixaban (ELIQUIS) 5 MG TABS tablet Take 1 tablet (5 mg total) by mouth 2 (two) times daily. 12/02/17  Yes Lewayne Bunting, MD  aspirin EC 81 MG tablet Take 1 tablet (81 mg total) by mouth daily. 09/17/16  Yes Bhagat, Bhavinkumar, PA  atorvastatin (LIPITOR) 40 MG tablet Take 1  tablet (40 mg total) by mouth daily. 10/21/16  Yes Pricilla Riffle, MD  carvedilol (COREG) 3.125 MG tablet Take 1 tablet (3.125 mg total) by mouth 2 (two) times daily with a meal. 12/02/17  Yes Crenshaw, Madolyn Frieze, MD  Cholecalciferol (VITAMIN D3) 2000 units capsule Take 2,000 Units by mouth daily.  01/11/16  Yes [provider]  insulin glargine (LANTUS) 100 UNIT/ML injection Inject 0.4 mLs (40 Units total) into the skin at bedtime. Patient taking differently: Inject 45 Units into the skin at bedtime.  09/05/17  Yes Rodolph Bong, MD  levothyroxine (SYNTHROID, LEVOTHROID) 125 MCG tablet Take 1.5 tablets (188 mcg total) by mouth daily before breakfast. Patient taking differently: Take 125 mcg by mouth daily before breakfast.  09/06/17  Yes Rodolph Bong, MD  losartan (COZAAR) 50 MG tablet Take 50 mg by mouth daily.   Yes [provider]  metFORMIN (GLUCOPHAGE-XR) 750 MG 24 hr tablet Take 750 mg by mouth 2 (two) times daily.   Yes [provider]  Multiple Vitamins-Minerals (MEGA MULTIVITAMIN FOR MEN  PO) Take 1 tablet by mouth daily.    Yes [provider]  OXYGEN Inhale 2 L into the lungs continuous.   Yes [provider]  pregabalin (LYRICA) 50 MG capsule Take 50 mg by mouth 2 (two) times daily.   Yes [provider]  tamsulosin (FLOMAX) 0.4 MG CAPS capsule Take 1 capsule (0.4 mg total) by mouth at bedtime. 09/14/17  Yes Sharlene Dory, DO  tiotropium (SPIRIVA HANDIHALER) 18 MCG inhalation capsule Place 1 capsule (18 mcg total) into inhaler and inhale daily. 07/16/17  Yes Parrett, Tammy S, NP  albuterol (PROVENTIL) (2.5 MG/3ML) 0.083% nebulizer solution 1 vial in nebulizer every 6 hours as needed Dx 496 Patient taking differently: Take 2.5 mg by nebulization every 6 (six) hours as needed for wheezing or shortness of breath. 1 vial in nebulizer every 6 hours as needed Dx 496 05/31/12   Kalman Shan, MD  Carboxymethylcellul-Glycerin  (LUBRICATING EYE DROPS OP) Apply 1 drop to eye daily as needed (dry eyes).    [provider]  losartan (COZAAR) 100 MG tablet Take 1 tablet (100 mg total) by mouth daily. Patient not taking: Reported on 01/20/2018 09/06/17   Rodolph Bong, MD  meclizine (ANTIVERT) 25 MG tablet TAKE 1 TABLET BY MOUTH THREE TIMES DAILY AS NEEDED FOR DIZZINESS Patient not taking: Reported on 01/20/2018 10/05/17   Sharlene Dory, DO  Semaglutide (OZEMPIC) 0.25 or 0.5 MG/DOSE SOPN Inject 0.25-0.5 mg into the skin every 7 (seven) days.     [provider]    Physical Exam: Vitals:   01/20/18 1230 01/20/18 1300 01/20/18 1553 01/20/18 1605  BP: (!) 156/84 136/75 (!) 129/58   Pulse: (!) 148 64 (!) 54   Resp: (!) 21 19    Temp:   97.6 F (36.4 C)   TempSrc:   Oral   SpO2: (!) 80% 92% 98%   Weight:    93.4 kg (206 lb)  Height:    6\' 2"  (1.88 m)     Constitutional: NAD, calm, comfortable elderly man Head: Normocephalic, atraumatic. Eyes: PERRL, dark ring noted on bilateral sclera around the iris ENMT: Mucous membranes are moist. Posterior pharynx clear of any exudate or lesions. Dix Halpike maneuver negative. Neck: normal, supple, no masses, no thyromegaly Respiratory: clear to auscultation bilaterally, no wheezing, no crackles. Normal respiratory effort. No accessory muscle use. On 3L O2 nasal canula. Cardiovascular: Regular rate and rhythm, appreciable no murmurs / rubs / gallops. 1+ BLE edema. 2+ pedal pulses.  Abdomen: soft, no tenderness.  Musculoskeletal:  No joint deformity upper and lower extremities. Nno contractures. Normal muscle tone.  Skin: He appears pale. Varicose veins noted on BLE. No rashes, lesions, ulcers. No induration Neurologic: Grossly non-focal, alert and oriented x 3,  Psychiatric: Normal judgment and insight.  Normal mood.    Labs on Admission: I have personally reviewed following labs and imaging studies  CBC: Recent Labs  Lab 01/18/18 0832  01/20/18 1056  WBC 4.1 4.1  NEUTROABS  --  2.5  HGB 8.2* 7.8*  HCT 24.5* 24.3*  MCV 83.8 85.3  PLT 34.0 Repeated and verified X2.* 48*   Basic Metabolic Panel: Recent Labs  Lab 01/18/18 0832 01/20/18 0904 01/20/18 1056  NA 139 139 139  K 4.7 5.3* 5.1  CL 106 107 107  CO2 24 26 24   GLUCOSE 108* 122* 102*  BUN 27* 27* 28*  CREATININE 1.74* 1.79* 1.87*  CALCIUM 8.6 8.9 8.2*   GFR: Estimated Creatinine Clearance: 39.1  mL/min (A) (by C-G formula based on SCr of 1.87 mg/dL (H)). Liver Function Tests: Recent Labs  Lab 01/18/18 0832 01/20/18 1056  AST 22 35  ALT 18 27  ALKPHOS 98 157*  BILITOT 0.6 0.8  PROT 5.6* 5.9*  ALBUMIN 3.6 3.3*   Recent Labs  Lab 01/20/18 1056  LIPASE 46   No results for input(s): AMMONIA in the last 168 hours. Coagulation Profile: No results for input(s): INR, PROTIME in the last 168 hours. Cardiac Enzymes: No results for input(s): CKTOTAL, CKMB, CKMBINDEX, TROPONINI in the last 168 hours. BNP (last 3 results) No results for input(s): PROBNP in the last 8760 hours. HbA1C: No results for input(s): HGBA1C in the last 72 hours. CBG: No results for input(s): GLUCAP in the last 168 hours. Lipid Profile: No results for input(s): CHOL, HDL, LDLCALC, TRIG, CHOLHDL, LDLDIRECT in the last 72 hours. Thyroid Function Tests: Recent Labs    01/18/18 0832 01/20/18 0904  TSH 0.09*  --   FREET4  --  1.79*   Anemia Panel: No results for input(s): VITAMINB12, FOLATE, FERRITIN, TIBC, IRON, RETICCTPCT in the last 72 hours. Urine analysis:    Component Value Date/Time   COLORURINE YELLOW 01/20/2018 1253   APPEARANCEUR CLEAR 01/20/2018 1253   LABSPEC 1.015 01/20/2018 1253   PHURINE 5.5 01/20/2018 1253   GLUCOSEU NEGATIVE 01/20/2018 1253   GLUCOSEU 100 (A) 10/02/2016 1045   HGBUR NEGATIVE 01/20/2018 1253   BILIRUBINUR NEGATIVE 01/20/2018 1253   BILIRUBINUR negative 07/06/2014 1704   KETONESUR NEGATIVE 01/20/2018 1253   PROTEINUR NEGATIVE  01/20/2018 1253   UROBILINOGEN 0.2 10/02/2016 1045   NITRITE NEGATIVE 01/20/2018 1253   LEUKOCYTESUR NEGATIVE 01/20/2018 1253    Radiological Exams on Admission: Ct Head Wo Contrast  Result Date: 01/20/2018 CLINICAL DATA:  Dizziness EXAM: CT HEAD WITHOUT CONTRAST TECHNIQUE: Contiguous axial images were obtained from the base of the skull through the vertex without intravenous contrast. COMPARISON:  01/30/2010 FINDINGS: Brain: Chronic atrophic and ischemic changes are again identified. A more focal area of decreased attenuation is noted adjacent to the right lateral ventricle best seen on image number 19 of series 2 likely representing some subacute ischemia. No acute infarct, acute hemorrhage or space-occupying mass lesion is noted. Vascular: No hyperdense vessel or unexpected calcification. Skull: Normal. Negative for fracture or focal lesion. Sinuses/Orbits: No acute finding. Other: None. IMPRESSION: Chronic atrophic and ischemic changes. Some recent subacute ischemia is noted in the right parietal lobe as described. No other focal abnormality is noted. Electronically Signed   By: Alcide Clever M.D.   On: 01/20/2018 12:00    EKG: Independently reviewed.  Assessment/Plan Active Problems:   Symptomatic anemia  Symptomatic anemia -Hgb 7.8, Hct 24.3, MCV 85.3, Plt 48, stool guaiac positive in ED. -Patient is symptomatic with dizziness and fatigue. -GI consulted and will formally see the patient in the AM -Anemia panel ordered, check CBC in AM -Transfuse with 1 U PRBC, H&H after transfusion  PAF -Stable, on Amiodarone and Eliquis -Continue home dose Amiodarone -Hold Eliquis in case of GI bleed  CAD/HLD/Hx of Cardiac cath with stent 08/2016 -No chest pain -Continue home statin -Hold ASA in case of GI bleed  COPD with chronic respiratory failure/Pulmonary fibrosis -No signs of exacerbation, denies SOB, wheezes -On 3L O2 at home -Continue home Spiriva and nebulizer treatments as  needed  Systolic CHF -No signs of exacerbation -Heart healthy diet -Continue home Coreg, Lasix  Type 2 DM, insulin dependent -Continue home Lantus with insulin sliding scale -  Carb modified diet  Hypothyroidism -Continue home synthroid  HTN -Continue home Cozaar   DVT prophylaxis: SCDs Code Status: Partial code Family Communication: No family members at bedside Disposition Plan: Anticipate discharge to previous home environment.  Consults called: GI Admission status: Inpatient   Cindee Lame PA-S [facility] Pager: Text Page via [email or web address]  [phone]  If 7PM-7AM, please contact night-coverage [email or web address] Password [facility]  01/20/2018, 4:43 PM

## 2018-01-20 NOTE — Telephone Encounter (Signed)
Copied from CRM [id number]. Topic: General - Other >> Jan 20, 2018  9:01 AM Percival Spanish wrote:  Pt wife called cal lto ask for a call back asap from Marita Burnsed   [phone]  Called her back and did inform the patient is seeing PCP now  The wife is very concerned about her husband and felt better that he is being seen by PCP now.

## 2018-01-20 NOTE — ED Provider Notes (Signed)
[facility] EMERGENCY DEPARTMENT Provider Note   CSN: [id number] Arrival date & time: 01/20/18  2952     History   Chief Complaint Chief Complaint  Patient presents with  . Gait Problem  . Eye Problem    HPI [name] is a 77 y.o. male.  Patient sent from primary care office upstairs for concerns about low hemoglobin.  Patient's hemoglobin has been dropping since May.  In comparison to visits in February.  The patient went to see them because of a concern since March that he has been not feeling well she had some gait problems some near falls some dizziness lightheadedness.  Patient was seen in the emergency department the end of May for these complaints.  And hemoglobin was noted to be in the 9 range.  Just 2 days ago patient's hemoglobin was 8.1.  Patient's bowel movements are are dark in color because he takes iron has been no evidence of any red blood.  Patient was admitted in February of this year for sepsis.  To [facility] long hospital.  Patient is the primary caregiver of his wife who is in hospice care at home.  Patient denies any syncope fevers trouble breathing chest pain headache new neuro focal deficits other than the concern perhaps for the walking coordination.  In addition patient stools have been heme positive despite being black one was the end of May and is been 1 since by primary care provider.  Patient was scheduled to see gastroenterology we presume [facility] tomorrow.  Patient normally on 3 L of oxygen at all times.      Past Medical History:  Diagnosis Date  . Anxiety   . Aortic aneurysm (HCC) 09/02/2017  . BENIGN PROSTATIC HYPERTROPHY 11/23/2009  . Cardiomyopathy (HCC) 08/28/2016  . Chronic systolic CHF (congestive heart failure) (HCC) 09/02/2017  . COPD (chronic obstructive pulmonary disease) (HCC)   . CORONARY ARTERY DISEASE 11/23/2009  . 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 DEGENERATIVE JOINT DISEASE 11/23/2009  . DEPRESSION 11/23/2009  .  FATIGUE 11/23/2009  . GAIT DISTURBANCE 12/10/2009  . HEMOPTYSIS UNSPECIFIED 05/07/2010  . High cholesterol   . HYPERTENSION 07/30/2009  . HYPOTHYROIDISM 07/30/2009  . Ischemic cardiomyopathy 09/02/2017  . 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 On home oxygen therapy    "2-3L; 24/7" (09/10/2016)  . OSA on CPAP   . PTSD (post-traumatic stress disorder) 03/10/2012  . 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 RA (rheumatoid arthritis) (HCC) 06/11/2011   "qwhere" (09/10/2016)  . RESPIRATORY FAILURE, CHRONIC 07/31/2009  . Scleritis of both eyes 03/17/2014  . 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 Type II diabetes mellitus [facility])     Patient Active Problem List   Diagnosis Date Noted  . Dizziness 01/18/2018  . Chronic respiratory failure with hypoxia (HCC) 12/01/2017  . Lung mass 12/01/2017  . Paroxysmal atrial fibrillation (HCC)   . Ischemic cardiomyopathy 09/02/2017  . Chronic systolic CHF (congestive heart failure) (HCC) 09/02/2017  . Aortic aneurysm (HCC) 09/02/2017  . Diabetes mellitus with complication, with long-term current use of insulin (HCC) 09/01/2017  . Anxiety and depression 09/01/2017  . COPD (chronic obstructive pulmonary disease) (HCC) 09/01/2017  . Hyperlipidemia 01/05/2014  . OSA on CPAP 06/10/2011  . Coronary atherosclerosis 11/23/2009  . Hypothyroidism 07/30/2009  . Essential hypertension 07/30/2009    Past Surgical History:  Procedure Laterality Date  . 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 ABDOMINAL EXPLORATION SURGERY  02/2004   w/LOA/notes 12/10/2010; small bowel obstruction  repair with adhesiolysis   . BACK SURGERY    . CARDIAC CATHETERIZATION     2 heart caths in the past.  One in 2000s showed one ulcerated plaque  Rx medically; Second at [facility] [name] 09/05/2016  . CATARACT EXTRACTION W/ INTRAOCULAR LENS  IMPLANT, BILATERAL Bilateral 2000s  . COLECTOMY     hx of remote ileum resection due to bleeding  . CORONARY ANGIOPLASTY WITH STENT PLACEMENT  09/10/2016  .  CORONARY STENT INTERVENTION N/A 09/10/2016   Procedure: Coronary Stent Intervention;  Surgeon: Kathleene Hazel, MD;  Location: [facility] INVASIVE CV LAB;  Service: Cardiovascular;  Laterality: N/A;  Distal RCA 4.0x16 Synergy  . FEMORAL EMBOLOECTOMY Left 07/2000   with left leg ischemia; Dr. Hart Rochester, vascular  . GANGLION CYST EXCISION Right    "wrist"; Dr. Teressa Senter  . LUMBAR LAMINECTOMY  1972   Dr. Fannie Knee  . RIGHT/LEFT HEART CATH AND CORONARY ANGIOGRAPHY N/A 09/10/2016   Procedure: Right/Left Heart Cath and Coronary Angiography;  Surgeon: Kathleene Hazel, MD;  Location: [facility] INVASIVE CV LAB;  Service: Cardiovascular;  Laterality: N/A;  . TONSILLECTOMY          Home Medications    Prior to Admission medications   Medication Sig Start Date End Date Taking? Authorizing Provider  acetaminophen (TYLENOL) 325 MG tablet Take 650 mg by mouth daily as needed for moderate pain or headache.     [provider]  albuterol (PROVENTIL) (2.5 MG/3ML) 0.083% nebulizer solution 1 vial in nebulizer every 6 hours as needed Dx 496 Patient taking differently: Take 2.5 mg by nebulization every 6 (six) hours as needed for wheezing or shortness of breath. 1 vial in nebulizer every 6 hours as needed Dx 496 05/31/12   Kalman Shan, MD  ALPRAZolam Prudy Feeler) 0.5 MG tablet Take 0.5 mg by mouth 2 (two) times daily.    [provider]  amiodarone (PACERONE) 200 MG tablet TAKE 1 TABLET BY MOUTH TWICE DAILY FOR 2 WEEKS, THEN 1 TABLET DAILY 01/18/18   Carmelia Roller, Jilda Roche, DO  apixaban (ELIQUIS) 5 MG TABS tablet Take 1 tablet (5 mg total) by mouth 2 (two) times daily. 12/02/17   Lewayne Bunting, MD  aspirin EC 81 MG tablet Take 1 tablet (81 mg total) by mouth daily. 09/17/16   Bhagat, Sharrell Ku, PA  atorvastatin (LIPITOR) 40 MG tablet Take 1 tablet (40 mg total) by mouth daily. 10/21/16   Pricilla Riffle, MD  Carboxymethylcellul-Glycerin (LUBRICATING EYE DROPS OP) Apply 1 drop to eye daily as needed (dry  eyes).    [provider]  carvedilol (COREG) 3.125 MG tablet Take 1 tablet (3.125 mg total) by mouth 2 (two) times daily with a meal. 12/02/17   Crenshaw, Madolyn Frieze, MD  Cholecalciferol (VITAMIN D3) 2000 units capsule Take 2,000 Units by mouth daily.  01/11/16   [provider]  ELIQUIS 5 MG TABS tablet TAKE 1 TABLET(5 MG) BY MOUTH TWICE DAILY 12/22/17   Wendling, Jilda Roche, DO  furosemide (LASIX) 40 MG tablet TK 1 T PO D PRN FOR FLUID OR EDEMA 09/11/16   [provider]  insulin glargine (LANTUS) 100 UNIT/ML injection Inject 0.4 mLs (40 Units total) into the skin at bedtime. 09/05/17   Rodolph Bong, MD  levothyroxine (SYNTHROID, LEVOTHROID) 125 MCG tablet Take 1.5 tablets (188 mcg total) by mouth daily before breakfast. 09/06/17   Rodolph Bong, MD  losartan (COZAAR) 100 MG tablet Take 1 tablet (100 mg total) by mouth daily. Patient taking differently:  Take 50 mg by mouth daily.  09/06/17   Rodolph Bong, MD  magnesium (MAGTAB) 84 MG ( ) TBCR SR tablet Take 168 mg by mouth at bedtime.    [provider]  meclizine (ANTIVERT) 25 MG tablet TAKE 1 TABLET BY MOUTH THREE TIMES DAILY AS NEEDED FOR DIZZINESS 10/05/17   Sharlene Dory, DO  metFORMIN (GLUCOPHAGE-XR) 750 MG 24 hr tablet Take 750 mg by mouth 2 (two) times daily.    [provider]  Multiple Vitamins-Minerals (MEGA MULTIVITAMIN FOR MEN PO) Take 1 tablet by mouth daily.     [provider]  OXYGEN Inhale 2 L into the lungs continuous.    [provider]  pregabalin (LYRICA) 50 MG capsule Take 50 mg by mouth 2 (two) times daily.    [provider]  Semaglutide (OZEMPIC) 0.25 or 0.5 MG/DOSE SOPN Inject 0.25-0.5 mg into the skin every 7 (seven) days.     [provider]  tamsulosin (FLOMAX) 0.4 MG CAPS capsule Take 1 capsule (0.4 mg total) by mouth at bedtime. 09/14/17   Sharlene Dory, DO  tiotropium (SPIRIVA HANDIHALER) 18 MCG inhalation  capsule Place 1 capsule (18 mcg total) into inhaler and inhale daily. 07/16/17   Parrett, Virgel Bouquet, NP    Family History Family History  Problem Relation Age of Onset  . Other Mother        gun shot    Social History Social History   Tobacco Use  . Smoking status: Former Smoker    Packs/day: 2.50    Years: 40.00    Pack years: 100.00    Types: Cigarettes, Pipe, Cigars    Last attempt to quit: 07/28/1998    Years since quitting: 19.4  . Smokeless tobacco: Never Used  Substance Use Topics  . Alcohol use: No    Alcohol/week: 0.0 oz  . Drug use: No     Allergies   Patient has no known allergies.   Review of Systems Review of Systems  Constitutional: Positive for fatigue. Negative for fever.  HENT: Negative for congestion.   Eyes: Positive for visual disturbance.  Respiratory: Negative for shortness of breath.   Cardiovascular: Negative for chest pain, palpitations and leg swelling.  Gastrointestinal: Negative for abdominal pain, blood in stool, diarrhea, nausea and vomiting.  Genitourinary: Negative for dysuria and hematuria.  Musculoskeletal: Negative for back pain.  Neurological: Positive for dizziness, weakness and light-headedness. Negative for headaches.  Hematological: Does not bruise/bleed easily.  Psychiatric/Behavioral: Negative for confusion.     Physical Exam Updated Vital Signs BP (!) 162/70   Pulse 66   Temp 98.2 F (36.8 C) (Oral)   Resp (!) 25   Ht 1.829 m (6')   Wt 95.3 kg (210 lb)   SpO2 92%   BMI 28.48 kg/m   Physical Exam  Constitutional: He is oriented to person, place, and time. He appears well-developed and well-nourished. No distress.  HENT:  Head: Normocephalic and atraumatic.  Mouth/Throat: Oropharynx is clear and moist.  Eyes: Pupils are equal, round, and reactive to light. Conjunctivae are normal.  Neck: Normal range of motion. Neck supple.  Cardiovascular: Normal rate, regular rhythm and normal heart sounds.  Pulmonary/Chest:  Effort normal and breath sounds normal. No respiratory distress.  Abdominal: Soft. Bowel sounds are normal. There is no tenderness.  Genitourinary: Rectal exam shows guaiac positive stool.  Musculoskeletal: Normal range of motion. He exhibits no edema.  Neurological: He is alert and oriented to person, place, and time.  No cranial nerve deficit or sensory deficit. He exhibits normal muscle tone. Coordination normal.  Skin: Skin is warm. There is pallor.  Nursing note and vitals reviewed.    ED Treatments / Results  Labs (all labs ordered are listed, but only abnormal results are displayed) Labs Reviewed  COMPREHENSIVE METABOLIC PANEL - Abnormal; Notable for the following components:      Result Value   Glucose, Bld 102 (*)    BUN 28 (*)    Creatinine, Ser 1.87 (*)    Calcium 8.2 (*)    Total Protein 5.9 (*)    Albumin 3.3 (*)    Alkaline Phosphatase 157 (*)    GFR calc non Af Amer 33 (*)    GFR calc Af Amer 39 (*)    All other components within normal limits  CBC WITH DIFFERENTIAL/PLATELET - Abnormal; Notable for the following components:   RBC 2.85 (*)    Hemoglobin 7.8 (*)    HCT 24.3 (*)    Platelets 48 (*)    All other components within normal limits  LIPASE, BLOOD  URINALYSIS, ROUTINE W REFLEX MICROSCOPIC    EKG EKG Interpretation  Date/Time:  Wednesday January 20 2018 10:11:15 EDT Ventricular Rate:  60 PR Interval:    QRS Duration: 146 QT Interval:  474 QTC Calculation: 474 R Axis:   -22 Text Interpretation:  Sinus rhythm Prolonged PR interval Left bundle branch block No significant change since last tracing Confirmed by Vanetta Mulders [phone]) on 01/20/2018 10:19:20 AM   Radiology Ct Head Wo Contrast  Result Date: 01/20/2018 CLINICAL DATA:  Dizziness EXAM: CT HEAD WITHOUT CONTRAST TECHNIQUE: Contiguous axial images were obtained from the base of the skull through the vertex without intravenous contrast. COMPARISON:  01/30/2010 FINDINGS: Brain: Chronic atrophic  and ischemic changes are again identified. A more focal area of decreased attenuation is noted adjacent to the right lateral ventricle best seen on image number 19 of series 2 likely representing some subacute ischemia. No acute infarct, acute hemorrhage or space-occupying mass lesion is noted. Vascular: No hyperdense vessel or unexpected calcification. Skull: Normal. Negative for fracture or focal lesion. Sinuses/Orbits: No acute finding. Other: None. IMPRESSION: Chronic atrophic and ischemic changes. Some recent subacute ischemia is noted in the right parietal lobe as described. No other focal abnormality is noted. Electronically Signed   By: Alcide Clever M.D.   On: 01/20/2018 12:00    Procedures Procedures (including critical care time)  Medications Ordered in ED Medications - No data to display   Initial Impression / Assessment and Plan / ED Course  I have reviewed the triage vital signs and the nursing notes.  Pertinent labs & imaging results that were available during my care of the patient were reviewed by me and considered in my medical decision making (see chart for details).    Patient's hemoglobin here today is 7.8.  Certainly patient qualifies for blood transfusion.  He is pale.  Do not feel that his main complaint of symptoms since March are related to this.  Hemoglobin was 9.9 in May.  Patient's work-up here head CT without any acute abnormalities labs as mentioned with a low hemoglobin.  Patient's had heme positive stools.  No gross blood.  Based on patient's gait complaints would want to do MRI with and face of the negative head CT.  Chest x-ray not reordered because patient is recently been seen by cardiology patient with a past history of abnormal rhythm is now under control.  Patient without  any chest pain.    Patient will require type and cross and 2 units blood transfusion.  Discussed with [facility] long hospitalist they will admit.  Consideration for consultation by  gastroenterology.  Also consideration for doing the MRI.  Patient hemodynamically stable here.  Patient normally on 3 L of oxygen at all times oxygen sats are fine.   Final Clinical Impressions(s) / ED Diagnoses   Final diagnoses:  Gastrointestinal hemorrhage, unspecified gastrointestinal hemorrhage type  Weakness    ED Discharge Orders    None       Vanetta Mulders, MD 01/20/18 1534

## 2018-01-20 NOTE — Patient Instructions (Addendum)
Go to ER.  Let us know if you need anything.  

## 2018-01-20 NOTE — ED Notes (Signed)
Patient transported to CT 

## 2018-01-20 NOTE — Progress Notes (Signed)
Pre visit review using our clinic review tool, if applicable. No additional management support is needed unless otherwise documented below in the visit note. 

## 2018-01-20 NOTE — ED Triage Notes (Signed)
Pt c/o unsteady gait and change in vision; he saw an ophthalmologist yesterday for same; saw PCP today and was referred here for dizziness and gait problem

## 2018-01-20 NOTE — ED Notes (Signed)
Report to [place], RN at [facility]. 

## 2018-01-21 ENCOUNTER — Ambulatory Visit: Payer: Medicare Other | Admitting: Physician Assistant

## 2018-01-21 DIAGNOSIS — D508 Other iron deficiency anemias: Secondary | ICD-10-CM

## 2018-01-21 DIAGNOSIS — R531 Weakness: Secondary | ICD-10-CM

## 2018-01-21 DIAGNOSIS — K922 Gastrointestinal hemorrhage, unspecified: Secondary | ICD-10-CM

## 2018-01-21 DIAGNOSIS — I480 Paroxysmal atrial fibrillation: Secondary | ICD-10-CM

## 2018-01-21 LAB — TYPE AND SCREEN
ABO/RH(D): O POS
ANTIBODY SCREEN: NEGATIVE
Unit division: 0

## 2018-01-21 LAB — BPAM RBC
Blood Product Expiration Date: 201907192359
ISSUE DATE / TIME: 201906262232
Unit Type and Rh: 5100

## 2018-01-21 LAB — CBC (INCLUDES DIFF/PLT) WITH PATHOLOGIST REVIEW
BASOS ABS: 19 {cells}/uL (ref 0–200)
Basophils Relative: 0.4 %
EOS ABS: 202 {cells}/uL (ref 15–500)
Eosinophils Relative: 4.3 %
HEMATOCRIT: 25 % — AB (ref 38.5–50.0)
Hemoglobin: 7.9 g/dL — ABNORMAL LOW (ref 13.2–17.1)
Lymphs Abs: 823 cells/uL — ABNORMAL LOW (ref 850–3900)
MCH: 27.7 pg (ref 27.0–33.0)
MCHC: 31.6 g/dL — AB (ref 32.0–36.0)
MCV: 87.7 fL (ref 80.0–100.0)
MONOS PCT: 14.5 %
MPV: 12.5 fL (ref 7.5–12.5)
NEUTROS PCT: 63.3 %
Neutro Abs: 2975 cells/uL (ref 1500–7800)
PLATELETS: 52 10*3/uL — AB (ref 140–400)
RBC: 2.85 10*6/uL — ABNORMAL LOW (ref 4.20–5.80)
RDW: 14.9 % (ref 11.0–15.0)
TOTAL LYMPHOCYTE: 17.5 %
WBC mixed population: 682 cells/uL (ref 200–950)
WBC: 4.7 10*3/uL (ref 3.8–10.8)

## 2018-01-21 LAB — BASIC METABOLIC PANEL
ANION GAP: 8 (ref 5–15)
BUN: 27 mg/dL — AB (ref 8–23)
CALCIUM: 9.4 mg/dL (ref 8.9–10.3)
CHLORIDE: 107 mmol/L (ref 98–111)
CO2: 29 mmol/L (ref 22–32)
CREATININE: 1.59 mg/dL — AB (ref 0.61–1.24)
GFR calc Af Amer: 47 mL/min — ABNORMAL LOW (ref >60)
GFR, EST NON AFRICAN AMERICAN: 41 mL/min — AB (ref >60)
Glucose, Bld: 126 mg/dL — ABNORMAL HIGH (ref 70–99)
Potassium: 4.8 mmol/L (ref 3.5–5.1)
SODIUM: 144 mmol/L (ref 135–145)

## 2018-01-21 LAB — GLUCOSE, CAPILLARY
GLUCOSE-CAPILLARY: 126 mg/dL — AB (ref 70–99)
Glucose-Capillary: 92 mg/dL (ref 70–99)

## 2018-01-21 LAB — HEMOGLOBIN AND HEMATOCRIT, BLOOD
HCT: 30.4 % — ABNORMAL LOW (ref 39.0–52.0)
Hemoglobin: 9.6 g/dL — ABNORMAL LOW (ref 13.0–17.0)

## 2018-01-21 MED ORDER — FUROSEMIDE 10 MG/ML IJ SOLN
INTRAMUSCULAR | Status: AC
  Administered 2018-01-21: 20 mg via INTRAVENOUS
  Filled 2018-01-21: qty 2

## 2018-01-21 MED ORDER — METOCLOPRAMIDE HCL 5 MG/ML IJ SOLN
5.0000 mg | Freq: Two times a day (BID) | INTRAMUSCULAR | Status: AC
  Administered 2018-01-21 (×2): 5 mg via INTRAVENOUS
  Filled 2018-01-21 (×2): qty 2

## 2018-01-21 MED ORDER — PEG-KCL-NACL-NASULF-NA ASC-C 100 G PO SOLR
0.5000 | Freq: Once | ORAL | Status: AC
  Administered 2018-01-21: 100 g via ORAL
  Filled 2018-01-21: qty 1

## 2018-01-21 MED ORDER — SODIUM CHLORIDE 0.9 % IV SOLN
INTRAVENOUS | Status: DC
  Administered 2018-01-21: 23:00:00 via INTRAVENOUS

## 2018-01-21 MED ORDER — PEG-KCL-NACL-NASULF-NA ASC-C 100 G PO SOLR: 1.0000 | Freq: Once | ORAL | Status: DC

## 2018-01-21 NOTE — H&P (View-Only) (Signed)
   Consultation  Referring Provider: Dr. Hall    Primary Care Physician:  Wendling, Nicholas Paul, DO Primary Gastroenterologist: Previously [facility] requesting transfer to [place] (did have appointment with Dr. Jacobs in a week)     Reason for Consultation:   Symptomatic anemia, FOBT positive         Impression / Plan:   Impression: 1.  Symptomatic anemia: 7.9-->9.6 with 2 units last night, feeling some better today; consider GI source of blood loss versus other 2.  COPD with chronic oxygen use: 3 L at home 3.  CAD status post stents on chronic anticoagulation: Eliquis, last dose 01/19/2018  Plan: 1.  We will schedule patient for EGD and colonoscopy with Dr. Halbert Jesson tomorrow.  Did discuss risk, benefits, limitations and alternatives and the patient agrees to proceed. 2.  Ordered MoviPrep 3.  Ordered IV Reglan 5 mg with start of both prep doses 4.  Patient to remain on clear liquids today then n.p.o. after midnight 5.  Continue to monitor hemoglobin and transfusion as needed less than 7 6.  Discussed with patient that there is a chance that he may be able to be discharged tomorrow, pending labs and symptoms/status/findings at that time 7.  Please await final recommendations from Dr. Damita Eppard later today  Thank you for your kind consultation, we will continue to follow.  Jennifer Lynne Lemmon  01/21/2018, 10:53 AM Pager #: [phone]    [facility] Attending   I have taken an interval history, reviewed the chart and examined the patient. I agree with the Advanced Practitioner's note, impression and recommendations.    Felcia Huebert E. Tina Gruner, MD, FACG [facility] 01/21/2018 9:16 PM   HPI:   [name] is a 77 y.o. male with past medical history as listed below including significant COPD on home oxygen, pulmonary fibrosis, A. 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  We were consulted in regards to finding of a hemoglobin of 7.8, FOBT positive.    Today, reports that he has been feeling weaker and weaker over the past 3 months.  Was initially found to be anemic around February/March.  Yesterday before admission began feeling dizzy when standing up and proceeded to the ER.  Describes a weight loss of around 50 pounds in the past 6 months, though "I was trying to at first".      Also chronic loose stools per patient.  Tells me he has to sit when urinating because sometimes "I pass loose stool too".  Apparently this has been going on for years.    Previously followed with [facility] with a colonoscopy over 10 years ago which patient reports was normal.    Social history positive for having a wife at home in the "end stages of COPD".  Patient is nervous about leaving her alone for too long.    Denies fever, chills, anorexia, nausea, vomiting, bright red blood per rectum or black tarry stools heartburn, reflux or abdominal pain.  GI history: 07/28/2006- [facility]-report unavailable  Past Medical History:  Diagnosis Date  . Anxiety   . Aortic aneurysm (HCC) 09/02/2017  . BENIGN PROSTATIC HYPERTROPHY 11/23/2009  . Cardiomyopathy (HCC) 08/28/2016  . Chronic systolic CHF (congestive heart failure) (HCC) 09/02/2017  . COPD (chronic   obstructive pulmonary disease) (HCC)   . CORONARY ARTERY DISEASE 11/23/2009  . 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 DEGENERATIVE JOINT DISEASE 11/23/2009  . DEPRESSION 11/23/2009  . FATIGUE 11/23/2009  . GAIT DISTURBANCE 12/10/2009  . HEMOPTYSIS UNSPECIFIED 05/07/2010  . High cholesterol   . HYPERTENSION 07/30/2009  . HYPOTHYROIDISM 07/30/2009  . Ischemic cardiomyopathy 09/02/2017  . 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 On home oxygen therapy    "2-3L; 24/7" (09/10/2016)  . OSA  on CPAP   . PTSD (post-traumatic stress disorder) 03/10/2012  . 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 RA (rheumatoid arthritis) (HCC) 06/11/2011   "qwhere" (09/10/2016)  . RESPIRATORY FAILURE, CHRONIC 07/31/2009  . Scleritis of both eyes 03/17/2014  . 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 Type II diabetes mellitus (HCC)     Past Surgical History:  Procedure Laterality Date  . 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 ABDOMINAL EXPLORATION SURGERY  02/2004   w/LOA/notes 12/10/2010; small bowel obstruction repair with adhesiolysis   . BACK SURGERY    . CARDIAC CATHETERIZATION     2 heart caths in the past.  One in 2000s showed one ulcerated plaque  Rx medically; Second at [facility] [name] 09/05/2016  . CATARACT EXTRACTION W/ INTRAOCULAR LENS  IMPLANT, BILATERAL Bilateral 2000s  . COLECTOMY     hx of remote ileum resection due to bleeding  . CORONARY ANGIOPLASTY WITH STENT PLACEMENT  09/10/2016  . CORONARY STENT INTERVENTION N/A 09/10/2016   Procedure: Coronary Stent Intervention;  Surgeon: Kathleene Hazel, MD;  Location: [facility] INVASIVE CV LAB;  Service: Cardiovascular;  Laterality: N/A;  Distal RCA 4.0x16 Synergy  . FEMORAL EMBOLOECTOMY Left 07/2000   with left leg ischemia; Dr. Hart Rochester, vascular  . GANGLION CYST EXCISION Right    "wrist"; Dr. Teressa Senter  . LUMBAR LAMINECTOMY  1972   Dr. Fannie Knee  . RIGHT/LEFT HEART CATH AND CORONARY ANGIOGRAPHY N/A 09/10/2016   Procedure: Right/Left Heart Cath and Coronary Angiography;  Surgeon: Kathleene Hazel, MD;  Location: [facility] INVASIVE CV LAB;  Service: Cardiovascular;  Laterality: N/A;  . TONSILLECTOMY      Family History  Problem Relation Age of Onset  . Other Mother        gun shot     Social History   Tobacco Use  . Smoking status: Former Smoker    Packs/day: 2.50    Years: 40.00    Pack years: 100.00    Types: Cigarettes, Pipe, Cigars    Last attempt to quit: 07/28/1998    Years since quitting: 19.4  . Smokeless tobacco: Never Used    Substance Use Topics  . Alcohol use: No    Alcohol/week: 0.0 oz  . Drug use: No    Prior to Admission medications   Medication Sig Start Date End Date Taking? Authorizing Provider  acetaminophen (TYLENOL) 325 MG tablet Take 650 mg by mouth daily as needed for moderate pain or headache.    Yes [provider]  ALPRAZolam Prudy Feeler) 0.5 MG tablet Take 0.5 mg by mouth at bedtime.    Yes [provider]  amiodarone (PACERONE) 200 MG tablet TAKE 1 TABLET BY MOUTH TWICE DAILY FOR 2 WEEKS, THEN 1 TABLET DAILY Patient taking differently: TAKE 1 TABLET BY MOUTH DAILY 01/18/18  Yes Wendling, Jilda Roche, DO  apixaban (ELIQUIS) 5 MG TABS tablet Take 1 tablet (5 mg total) by mouth 2 (two) times daily. 12/02/17  Yes Lewayne Bunting, MD  aspirin EC 81 MG tablet Take 1  tablet (81 mg total) by mouth daily. 09/17/16  Yes Bhagat, Bhavinkumar, PA  atorvastatin (LIPITOR) 40 MG tablet Take 1 tablet (40 mg total) by mouth daily. 10/21/16  Yes Pricilla Riffle, MD  carvedilol (COREG) 3.125 MG tablet Take 1 tablet (3.125 mg total) by mouth 2 (two) times daily with a meal. 12/02/17  Yes Crenshaw, Madolyn Frieze, MD  Cholecalciferol (VITAMIN D3) 2000 units capsule Take 2,000 Units by mouth daily.  01/11/16  Yes [provider]  insulin glargine (LANTUS) 100 UNIT/ML injection Inject 0.4 mLs (40 Units total) into the skin at bedtime. Patient taking differently: Inject 45 Units into the skin at bedtime.  09/05/17  Yes Rodolph Bong, MD  levothyroxine (SYNTHROID, LEVOTHROID) 125 MCG tablet Take 1.5 tablets (188 mcg total) by mouth daily before breakfast. Patient taking differently: Take 125 mcg by mouth daily before breakfast.  09/06/17  Yes Rodolph Bong, MD  losartan (COZAAR) 50 MG tablet Take 50 mg by mouth daily.   Yes [provider]  metFORMIN (GLUCOPHAGE-XR) 750 MG 24 hr tablet Take 750 mg by mouth 2 (two) times daily.   Yes [provider]  Multiple Vitamins-Minerals (MEGA  MULTIVITAMIN FOR MEN PO) Take 1 tablet by mouth daily.    Yes [provider]  OXYGEN Inhale 2 L into the lungs continuous.   Yes [provider]  pregabalin (LYRICA) 50 MG capsule Take 50 mg by mouth 2 (two) times daily.   Yes [provider]  tamsulosin (FLOMAX) 0.4 MG CAPS capsule Take 1 capsule (0.4 mg total) by mouth at bedtime. 09/14/17  Yes Sharlene Dory, DO  tiotropium (SPIRIVA HANDIHALER) 18 MCG inhalation capsule Place 1 capsule (18 mcg total) into inhaler and inhale daily. 07/16/17  Yes Parrett, Tammy S, NP  albuterol (PROVENTIL) (2.5 MG/3ML) 0.083% nebulizer solution 1 vial in nebulizer every 6 hours as needed Dx 496 Patient taking differently: Take 2.5 mg by nebulization every 6 (six) hours as needed for wheezing or shortness of breath. 1 vial in nebulizer every 6 hours as needed Dx 496 05/31/12   Kalman Shan, MD  Carboxymethylcellul-Glycerin (LUBRICATING EYE DROPS OP) Apply 1 drop to eye daily as needed (dry eyes).    [provider]  losartan (COZAAR) 100 MG tablet Take 1 tablet (100 mg total) by mouth daily. Patient not taking: Reported on 01/20/2018 09/06/17   Rodolph Bong, MD  meclizine (ANTIVERT) 25 MG tablet TAKE 1 TABLET BY MOUTH THREE TIMES DAILY AS NEEDED FOR DIZZINESS Patient not taking: Reported on 01/20/2018 10/05/17   Sharlene Dory, DO  Semaglutide (OZEMPIC) 0.25 or 0.5 MG/DOSE SOPN Inject 0.25-0.5 mg into the skin every 7 (seven) days.     [provider]    Current Facility-Administered Medications  Medication Dose Route Frequency Provider Last Rate Last Dose  . 0.9 %  sodium chloride infusion (Manually program via Guardrails IV Fluids)   Intravenous Once Randel Pigg, Dorma Russell, MD      . acetaminophen (TYLENOL) tablet 650 mg  650 mg Oral Q6H PRN Randel Pigg, Dorma Russell, MD      . albuterol (PROVENTIL) (2.5 MG/3ML) 0.083% nebulizer solution 2.5 mg  2.5 mg Nebulization Q6H PRN Randel Pigg, Dorma Russell, MD        . ALPRAZolam Prudy Feeler) tablet 0.5 mg  0.5 mg Oral QHS Randel Pigg, Dorma Russell, MD   0.5 mg at 01/20/18 2251  . amiodarone (PACERONE) tablet 200 mg  200 mg Oral Daily Randel Pigg, Dorma Russell, MD   200  mg at 01/21/18 0934  . atorvastatin (LIPITOR) tablet 40 mg  40 mg Oral Daily Randel Pigg, Dorma Russell, MD   40 mg at 01/21/18 0934  . carvedilol (COREG) tablet 3.125 mg  3.125 mg Oral BID WC Randel Pigg, Dorma Russell, MD   3.125 mg at 01/21/18 0934  . insulin aspart (novoLOG) injection 0-15 Units  0-15 Units Subcutaneous TID WC Randel Pigg, Edwin, MD      . insulin glargine (LANTUS) injection 45 Units  45 Units Subcutaneous QHS Randel Pigg, Dorma Russell, MD   45 Units at 01/20/18 2251  . levothyroxine (SYNTHROID, LEVOTHROID) tablet 125 mcg  125 mcg Oral QAC breakfast Randel Pigg, Dorma Russell, MD   125 mcg at 01/21/18 [phone]  . losartan (COZAAR) tablet 50 mg  50 mg Oral Daily Randel Pigg, Dorma Russell, MD   50 mg at 01/21/18 0933  . ondansetron (ZOFRAN) tablet 4 mg  4 mg Oral Q6H PRN Randel Pigg, Dorma Russell, MD       Or  . ondansetron [facility]) injection 4 mg  4 mg Intravenous Q6H PRN Randel Pigg, Dorma Russell, MD      . pregabalin (LYRICA) capsule 50 mg  50 mg Oral BID Randel Pigg, Dorma Russell, MD   50 mg at 01/21/18 0934  . tamsulosin (FLOMAX) capsule 0.4 mg  0.4 mg Oral QHS Randel Pigg, Dorma Russell, MD   0.4 mg at 01/20/18 2251  . tiotropium (SPIRIVA) inhalation capsule 18 mcg  18 mcg Inhalation Daily Randel Pigg, Dorma Russell, MD   18 mcg at 01/21/18 [id number]    Allergies as of 01/20/2018 - Review Complete 01/20/2018  Allergen Reaction Noted  . No known allergies  01/20/2018     Review of Systems:    Constitutional: No fever or chills Skin: No rash  Cardiovascular: No chest pain  Respiratory: +chronic SOB Gastrointestinal: See HPI and otherwise negative Genitourinary: No dysuria Neurological: No headache Musculoskeletal: No new muscle or joint pain Hematologic: No bleeding Psychiatric: No history of depression or anxiety   Physical Exam:  Vital  signs in last 24 hours: Temp:  [97.6 F (36.4 C)-99.7 F (37.6 C)] 98.3 F (36.8 C) (06/27 0350) Pulse Rate:  [54-148] 71 (06/27 0350) Resp:  [19-28] 28 (06/27 0350) BP: (110-162)/(58-84) 141/79 (06/27 0350) SpO2:  [80 %-98 %] 92 % (06/27 0819) Weight:  [206 lb (93.4 kg)] 206 lb (93.4 kg) (06/26 1605) Last BM Date: 01/20/18 General:   Pleasant, chronically ill-appearing, elderly Caucasian male appears to be in NAD, Well developed, Well nourished, alert and cooperative Head:  Normocephalic and atraumatic. Eyes:   PEERL, EOMI. No icterus. Conjunctiva pink. Ears:  Normal auditory acuity. Neck:  Supple Throat: Oral cavity and pharynx without inflammation, swelling or lesion.  Lungs: Respirations even and unlabored. Lungs clear to auscultation bilaterally.   No wheezes, crackles, or rhonchi. o2 via [place] Heart: Normal S1, S2. No MRG. Regular rate and rhythm. No peripheral edema, cyanosis or pallor.  Abdomen:  Soft, nondistended, nontender. No rebound or guarding. Normal bowel sounds. No appreciable masses or hepatomegaly. Rectal:  Not performed.  Msk:  Symmetrical without gross deformities. Peripheral pulses intact.  Extremities:  Without edema, no deformity or joint abnormality. Normal ROM, normal sensation. Neurologic:  Alert and  oriented x4;  grossly normal neurologically.  Skin:   Dry and intact without significant lesions or rashes. Psychiatric: Demonstrates good judgement and reason without abnormal affect or behaviors.  LAB RESULTS: Recent Labs    01/20/18 0904 01/20/18 1056 01/21/18 0517  WBC 4.7 4.1  --  HGB 7.9* 7.8* 9.6*  HCT 25.0* 24.3* 30.4*  PLT 52* 48*  --    BMET Recent Labs    01/20/18 0904 01/20/18 1056 01/21/18 0517  NA 139 139 144  K 5.3* 5.1 4.8  CL 107 107 107  CO2 26 24 29  GLUCOSE 122* 102* 126*  BUN 27* 28* 27*  CREATININE 1.79* 1.87* 1.59*  CALCIUM 8.9 8.2* 9.4   LFT Recent Labs    01/20/18 1056  PROT 5.9*  ALBUMIN 3.3*  AST 35  ALT 27   ALKPHOS 157*  BILITOT 0.8   STUDIES: Ct Head Wo Contrast  Result Date: 01/20/2018 CLINICAL DATA:  Dizziness EXAM: CT HEAD WITHOUT CONTRAST TECHNIQUE: Contiguous axial images were obtained from the base of the skull through the vertex without intravenous contrast. COMPARISON:  01/30/2010 FINDINGS: Brain: Chronic atrophic and ischemic changes are again identified. A more focal area of decreased attenuation is noted adjacent to the right lateral ventricle best seen on image number 19 of series 2 likely representing some subacute ischemia. No acute infarct, acute hemorrhage or space-occupying mass lesion is noted. Vascular: No hyperdense vessel or unexpected calcification. Skull: Normal. Negative for fracture or focal lesion. Sinuses/Orbits: No acute finding. Other: None. IMPRESSION: Chronic atrophic and ischemic changes. Some recent subacute ischemia is noted in the right parietal lobe as described. No other focal abnormality is noted. Electronically Signed   By: Mark  Lukens M.D.   On: 01/20/2018 12:00   Mr Brain Wo Contrast  Result Date: 01/20/2018 CLINICAL DATA:  Dizziness. EXAM: MRI HEAD WITHOUT CONTRAST TECHNIQUE: Multiplanar, multiecho pulse sequences of the brain and surrounding structures were obtained without intravenous contrast. COMPARISON:  Head CT 01/20/2018 MR brain 01/30/2010 FINDINGS: BRAIN: There is no acute infarct, acute hemorrhage or mass effect. The midline structures are normal. There are no old infarcts. Multifocal white matter hyperintensity, most commonly due to chronic ischemic microangiopathy. Generalized atrophy without lobar predilection. Susceptibility-sensitive sequences show no chronic microhemorrhage or superficial siderosis. VASCULAR: Major intracranial arterial and venous sinus flow voids are preserved. SKULL AND UPPER CERVICAL SPINE: The visualized skull base, calvarium, upper cervical spine and extracranial soft tissues are normal. SINUSES/ORBITS: No fluid levels or  advanced mucosal thickening. No mastoid or middle ear effusion. The orbits are normal. IMPRESSION: Generalized atrophy and chronic small vessel ischemia without acute intracranial abnormality. Electronically Signed   By: Kevin  Herman M.D.   On: 01/20/2018 22:01     

## 2018-01-21 NOTE — Consult Note (Addendum)
Consultation  Referring Provider: Dr. Margo Aye    Primary Care Physician:  Sharlene Dory, DO Primary Gastroenterologist: Previously Deboraha Sprang requesting transfer to [place] (did have appointment with Dr. Christella Hartigan in a week)     Reason for Consultation:   Symptomatic anemia, FOBT positive         Impression / Plan:   Impression: 1.  Symptomatic anemia: 7.9-->9.6 with 2 units last night, feeling some better today; consider GI source of blood loss versus other 2.  COPD with chronic oxygen use: 3 L at home 3.  CAD status post stents on chronic anticoagulation: Eliquis, last dose 01/19/2018  Plan: 1.  We will schedule patient for EGD and colonoscopy with Dr. Leone Payor tomorrow.  Did discuss risk, benefits, limitations and alternatives and the patient agrees to proceed. 2.  Ordered MoviPrep 3.  Ordered IV Reglan 5 mg with start of both prep doses 4.  Patient to remain on clear liquids today then n.p.o. after midnight 5.  Continue to monitor hemoglobin and transfusion as needed less than 7 6.  Discussed with patient that there is a chance that he may be able to be discharged tomorrow, pending labs and symptoms/status/findings at that time 7.  Please await final recommendations from Dr. Leone Payor later today  Thank you for your kind consultation, we will continue to follow.  Violet Baldy [facility]  01/21/2018, 10:53 AM Pager #: [phone]    [facility] Attending   I have taken an interval history, reviewed the chart and examined the patient. I agree with the Advanced Practitioner's note, impression and recommendations.    Iva Boop, MD, [facility] 01/21/2018 9:16 PM   HPI:   [name] is a 77 y.o. male with past medical history as listed below including significant COPD on home oxygen, pulmonary fibrosis, A. 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  We were consulted in regards to finding of a hemoglobin of 7.8, FOBT positive.    Today, reports that he has been feeling weaker and weaker over the past 3 months.  Was initially found to be anemic around February/March.  Yesterday before admission began feeling dizzy when standing up and proceeded to the ER.  Describes a weight loss of around 50 pounds in the past 6 months, though "I was trying to at first".      Also chronic loose stools per patient.  Tells me he has to sit when urinating because sometimes "I pass loose stool too".  Apparently this has been going on for years.    Previously followed with [facility] gastroenterology with a colonoscopy over 10 years ago which patient reports was normal.    Social history positive for having a wife at home in the "end stages of COPD".  Patient is nervous about leaving her alone for too long.    Denies fever, chills, anorexia, nausea, vomiting, bright red blood per rectum or black tarry stools heartburn, reflux or abdominal pain.  GI history: 1/1/2008Deboraha Sprang GI-report unavailable  Past Medical History:  Diagnosis Date  . Anxiety   . Aortic aneurysm (HCC) 09/02/2017  . BENIGN PROSTATIC HYPERTROPHY 11/23/2009  . Cardiomyopathy (HCC) 08/28/2016  . Chronic systolic CHF (congestive heart failure) (HCC) 09/02/2017  . COPD (chronic  obstructive pulmonary disease) (HCC)   . CORONARY ARTERY DISEASE 11/23/2009  . 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 DEGENERATIVE JOINT DISEASE 11/23/2009  . DEPRESSION 11/23/2009  . FATIGUE 11/23/2009  . GAIT DISTURBANCE 12/10/2009  . HEMOPTYSIS UNSPECIFIED 05/07/2010  . High cholesterol   . HYPERTENSION 07/30/2009  . HYPOTHYROIDISM 07/30/2009  . Ischemic cardiomyopathy 09/02/2017  . 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 On home oxygen therapy    "2-3L; 24/7" (09/10/2016)  . OSA  on CPAP   . PTSD (post-traumatic stress disorder) 03/10/2012  . 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 RA (rheumatoid arthritis) (HCC) 06/11/2011   "qwhere" (09/10/2016)  . RESPIRATORY FAILURE, CHRONIC 07/31/2009  . Scleritis of both eyes 03/17/2014  . 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 Type II diabetes mellitus (HCC)     Past Surgical History:  Procedure Laterality Date  . 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 ABDOMINAL EXPLORATION SURGERY  02/2004   w/LOA/notes 12/10/2010; small bowel obstruction repair with adhesiolysis   . BACK SURGERY    . CARDIAC CATHETERIZATION     2 heart caths in the past.  One in 2000s showed one ulcerated plaque  Rx medically; Second at [facility] [name] 09/05/2016  . CATARACT EXTRACTION W/ INTRAOCULAR LENS  IMPLANT, BILATERAL Bilateral 2000s  . COLECTOMY     hx of remote ileum resection due to bleeding  . CORONARY ANGIOPLASTY WITH STENT PLACEMENT  09/10/2016  . CORONARY STENT INTERVENTION N/A 09/10/2016   Procedure: Coronary Stent Intervention;  Surgeon: Kathleene Hazel, MD;  Location: [facility] INVASIVE CV LAB;  Service: Cardiovascular;  Laterality: N/A;  Distal RCA 4.0x16 Synergy  . FEMORAL EMBOLOECTOMY Left 07/2000   with left leg ischemia; Dr. Hart Rochester, vascular  . GANGLION CYST EXCISION Right    "wrist"; Dr. Teressa Senter  . LUMBAR LAMINECTOMY  1972   Dr. Fannie Knee  . RIGHT/LEFT HEART CATH AND CORONARY ANGIOGRAPHY N/A 09/10/2016   Procedure: Right/Left Heart Cath and Coronary Angiography;  Surgeon: Kathleene Hazel, MD;  Location: [facility] INVASIVE CV LAB;  Service: Cardiovascular;  Laterality: N/A;  . TONSILLECTOMY      Family History  Problem Relation Age of Onset  . Other Mother        gun shot     Social History   Tobacco Use  . Smoking status: Former Smoker    Packs/day: 2.50    Years: 40.00    Pack years: 100.00    Types: Cigarettes, Pipe, Cigars    Last attempt to quit: 07/28/1998    Years since quitting: 19.4  . Smokeless tobacco: Never Used    Substance Use Topics  . Alcohol use: No    Alcohol/week: 0.0 oz  . Drug use: No    Prior to Admission medications   Medication Sig Start Date End Date Taking? Authorizing Provider  acetaminophen (TYLENOL) 325 MG tablet Take 650 mg by mouth daily as needed for moderate pain or headache.    Yes [provider]  ALPRAZolam Prudy Feeler) 0.5 MG tablet Take 0.5 mg by mouth at bedtime.    Yes [provider]  amiodarone (PACERONE) 200 MG tablet TAKE 1 TABLET BY MOUTH TWICE DAILY FOR 2 WEEKS, THEN 1 TABLET DAILY Patient taking differently: TAKE 1 TABLET BY MOUTH DAILY 01/18/18  Yes Wendling, Jilda Roche, DO  apixaban (ELIQUIS) 5 MG TABS tablet Take 1 tablet (5 mg total) by mouth 2 (two) times daily. 12/02/17  Yes Lewayne Bunting, MD  aspirin EC 81 MG tablet Take 1  tablet (81 mg total) by mouth daily. 09/17/16  Yes Bhagat, Bhavinkumar, PA  atorvastatin (LIPITOR) 40 MG tablet Take 1 tablet (40 mg total) by mouth daily. 10/21/16  Yes Pricilla Riffle, MD  carvedilol (COREG) 3.125 MG tablet Take 1 tablet (3.125 mg total) by mouth 2 (two) times daily with a meal. 12/02/17  Yes Crenshaw, Madolyn Frieze, MD  Cholecalciferol (VITAMIN D3) 2000 units capsule Take 2,000 Units by mouth daily.  01/11/16  Yes [provider]  insulin glargine (LANTUS) 100 UNIT/ML injection Inject 0.4 mLs (40 Units total) into the skin at bedtime. Patient taking differently: Inject 45 Units into the skin at bedtime.  09/05/17  Yes Rodolph Bong, MD  levothyroxine (SYNTHROID, LEVOTHROID) 125 MCG tablet Take 1.5 tablets (188 mcg total) by mouth daily before breakfast. Patient taking differently: Take 125 mcg by mouth daily before breakfast.  09/06/17  Yes Rodolph Bong, MD  losartan (COZAAR) 50 MG tablet Take 50 mg by mouth daily.   Yes [provider]  metFORMIN (GLUCOPHAGE-XR) 750 MG 24 hr tablet Take 750 mg by mouth 2 (two) times daily.   Yes [provider]  Multiple Vitamins-Minerals (MEGA  MULTIVITAMIN FOR MEN PO) Take 1 tablet by mouth daily.    Yes [provider]  OXYGEN Inhale 2 L into the lungs continuous.   Yes [provider]  pregabalin (LYRICA) 50 MG capsule Take 50 mg by mouth 2 (two) times daily.   Yes [provider]  tamsulosin (FLOMAX) 0.4 MG CAPS capsule Take 1 capsule (0.4 mg total) by mouth at bedtime. 09/14/17  Yes Sharlene Dory, DO  tiotropium (SPIRIVA HANDIHALER) 18 MCG inhalation capsule Place 1 capsule (18 mcg total) into inhaler and inhale daily. 07/16/17  Yes Parrett, Tammy S, NP  albuterol (PROVENTIL) (2.5 MG/3ML) 0.083% nebulizer solution 1 vial in nebulizer every 6 hours as needed Dx 496 Patient taking differently: Take 2.5 mg by nebulization every 6 (six) hours as needed for wheezing or shortness of breath. 1 vial in nebulizer every 6 hours as needed Dx 496 05/31/12   Kalman Shan, MD  Carboxymethylcellul-Glycerin (LUBRICATING EYE DROPS OP) Apply 1 drop to eye daily as needed (dry eyes).    [provider]  losartan (COZAAR) 100 MG tablet Take 1 tablet (100 mg total) by mouth daily. Patient not taking: Reported on 01/20/2018 09/06/17   Rodolph Bong, MD  meclizine (ANTIVERT) 25 MG tablet TAKE 1 TABLET BY MOUTH THREE TIMES DAILY AS NEEDED FOR DIZZINESS Patient not taking: Reported on 01/20/2018 10/05/17   Sharlene Dory, DO  Semaglutide (OZEMPIC) 0.25 or 0.5 MG/DOSE SOPN Inject 0.25-0.5 mg into the skin every 7 (seven) days.     [provider]    Current Facility-Administered Medications  Medication Dose Route Frequency Provider Last Rate Last Dose  . 0.9 %  sodium chloride infusion (Manually program via Guardrails IV Fluids)   Intravenous Once Randel Pigg, Dorma Russell, MD      . acetaminophen (TYLENOL) tablet 650 mg  650 mg Oral Q6H PRN Randel Pigg, Dorma Russell, MD      . albuterol (PROVENTIL) (2.5 MG/3ML) 0.083% nebulizer solution 2.5 mg  2.5 mg Nebulization Q6H PRN Randel Pigg, Dorma Russell, MD        . ALPRAZolam Prudy Feeler) tablet 0.5 mg  0.5 mg Oral QHS Randel Pigg, Dorma Russell, MD   0.5 mg at 01/20/18 2251  . amiodarone (PACERONE) tablet 200 mg  200 mg Oral Daily Randel Pigg, Dorma Russell, MD   200  mg at 01/21/18 0934  . atorvastatin (LIPITOR) tablet 40 mg  40 mg Oral Daily Randel Pigg, Dorma Russell, MD   40 mg at 01/21/18 0934  . carvedilol (COREG) tablet 3.125 mg  3.125 mg Oral BID WC Randel Pigg, Dorma Russell, MD   3.125 mg at 01/21/18 0934  . insulin aspart (novoLOG) injection 0-15 Units  0-15 Units Subcutaneous TID WC Randel Pigg, Edwin, MD      . insulin glargine (LANTUS) injection 45 Units  45 Units Subcutaneous QHS Randel Pigg, Dorma Russell, MD   45 Units at 01/20/18 2251  . levothyroxine (SYNTHROID, LEVOTHROID) tablet 125 mcg  125 mcg Oral QAC breakfast Randel Pigg, Dorma Russell, MD   125 mcg at 01/21/18 [phone]  . losartan (COZAAR) tablet 50 mg  50 mg Oral Daily Randel Pigg, Dorma Russell, MD   50 mg at 01/21/18 0933  . ondansetron (ZOFRAN) tablet 4 mg  4 mg Oral Q6H PRN Randel Pigg, Dorma Russell, MD       Or  . ondansetron [facility]) injection 4 mg  4 mg Intravenous Q6H PRN Randel Pigg, Dorma Russell, MD      . pregabalin (LYRICA) capsule 50 mg  50 mg Oral BID Randel Pigg, Dorma Russell, MD   50 mg at 01/21/18 0934  . tamsulosin (FLOMAX) capsule 0.4 mg  0.4 mg Oral QHS Randel Pigg, Dorma Russell, MD   0.4 mg at 01/20/18 2251  . tiotropium (SPIRIVA) inhalation capsule 18 mcg  18 mcg Inhalation Daily Randel Pigg, Dorma Russell, MD   18 mcg at 01/21/18 [id number]    Allergies as of 01/20/2018 - Review Complete 01/20/2018  Allergen Reaction Noted  . No known allergies  01/20/2018     Review of Systems:    Constitutional: No fever or chills Skin: No rash  Cardiovascular: No chest pain  Respiratory: +chronic SOB Gastrointestinal: See HPI and otherwise negative Genitourinary: No dysuria Neurological: No headache Musculoskeletal: No new muscle or joint pain Hematologic: No bleeding Psychiatric: No history of depression or anxiety   Physical Exam:  Vital  signs in last 24 hours: Temp:  [97.6 F (36.4 C)-99.7 F (37.6 C)] 98.3 F (36.8 C) (06/27 0350) Pulse Rate:  [54-148] 71 (06/27 0350) Resp:  [19-28] 28 (06/27 0350) BP: (110-162)/(58-84) 141/79 (06/27 0350) SpO2:  [80 %-98 %] 92 % (06/27 0819) Weight:  [206 lb (93.4 kg)] 206 lb (93.4 kg) (06/26 1605) Last BM Date: 01/20/18 General:   Pleasant, chronically ill-appearing, elderly Caucasian male appears to be in NAD, Well developed, Well nourished, alert and cooperative Head:  Normocephalic and atraumatic. Eyes:   PEERL, EOMI. No icterus. Conjunctiva pink. Ears:  Normal auditory acuity. Neck:  Supple Throat: Oral cavity and pharynx without inflammation, swelling or lesion.  Lungs: Respirations even and unlabored. Lungs clear to auscultation bilaterally.   No wheezes, crackles, or rhonchi. o2 via [place] Heart: Normal S1, S2. No MRG. Regular rate and rhythm. No peripheral edema, cyanosis or pallor.  Abdomen:  Soft, nondistended, nontender. No rebound or guarding. Normal bowel sounds. No appreciable masses or hepatomegaly. Rectal:  Not performed.  Msk:  Symmetrical without gross deformities. Peripheral pulses intact.  Extremities:  Without edema, no deformity or joint abnormality. Normal ROM, normal sensation. Neurologic:  Alert and  oriented x4;  grossly normal neurologically.  Skin:   Dry and intact without significant lesions or rashes. Psychiatric: Demonstrates good judgement and reason without abnormal affect or behaviors.  LAB RESULTS: Recent Labs    01/20/18 0904 01/20/18 1056 01/21/18 0517  WBC 4.7 4.1  --  HGB 7.9* 7.8* 9.6*  HCT 25.0* 24.3* 30.4*  PLT 52* 48*  --    BMET Recent Labs    01/20/18 0904 01/20/18 1056 01/21/18 0517  NA 139 139 144  K 5.3* 5.1 4.8  CL 107 107 107  CO2 26 24 29   GLUCOSE 122* 102* 126*  BUN 27* 28* 27*  CREATININE 1.79* 1.87* 1.59*  CALCIUM 8.9 8.2* 9.4   LFT Recent Labs    01/20/18 1056  PROT 5.9*  ALBUMIN 3.3*  AST 35  ALT 27   ALKPHOS 157*  BILITOT 0.8   STUDIES: Ct Head Wo Contrast  Result Date: 01/20/2018 CLINICAL DATA:  Dizziness EXAM: CT HEAD WITHOUT CONTRAST TECHNIQUE: Contiguous axial images were obtained from the base of the skull through the vertex without intravenous contrast. COMPARISON:  01/30/2010 FINDINGS: Brain: Chronic atrophic and ischemic changes are again identified. A more focal area of decreased attenuation is noted adjacent to the right lateral ventricle best seen on image number 19 of series 2 likely representing some subacute ischemia. No acute infarct, acute hemorrhage or space-occupying mass lesion is noted. Vascular: No hyperdense vessel or unexpected calcification. Skull: Normal. Negative for fracture or focal lesion. Sinuses/Orbits: No acute finding. Other: None. IMPRESSION: Chronic atrophic and ischemic changes. Some recent subacute ischemia is noted in the right parietal lobe as described. No other focal abnormality is noted. Electronically Signed   By: Alcide Clever M.D.   On: 01/20/2018 12:00   Mr Brain Wo Contrast  Result Date: 01/20/2018 CLINICAL DATA:  Dizziness. EXAM: MRI HEAD WITHOUT CONTRAST TECHNIQUE: Multiplanar, multiecho pulse sequences of the brain and surrounding structures were obtained without intravenous contrast. COMPARISON:  Head CT 01/20/2018 MR brain 01/30/2010 FINDINGS: BRAIN: There is no acute infarct, acute hemorrhage or mass effect. The midline structures are normal. There are no old infarcts. Multifocal white matter hyperintensity, most commonly due to chronic ischemic microangiopathy. Generalized atrophy without lobar predilection. Susceptibility-sensitive sequences show no chronic microhemorrhage or superficial siderosis. VASCULAR: Major intracranial arterial and venous sinus flow voids are preserved. SKULL AND UPPER CERVICAL SPINE: The visualized skull base, calvarium, upper cervical spine and extracranial soft tissues are normal. SINUSES/ORBITS: No fluid levels or  advanced mucosal thickening. No mastoid or middle ear effusion. The orbits are normal. IMPRESSION: Generalized atrophy and chronic small vessel ischemia without acute intracranial abnormality. Electronically Signed   By: Deatra Robinson M.D.   On: 01/20/2018 22:01

## 2018-01-21 NOTE — Progress Notes (Signed)
Pt will have RN call when ready for CPAP qhs.  RN bringing Pt bowel prep to drink over approximately the next hour.  Pt has concerns then about being up and down to the bathroom and will have his RN call when ready for CPAP.

## 2018-01-21 NOTE — Progress Notes (Addendum)
PROGRESS NOTE  [name] UJW:[id number] DOB: 10-24-1940 DOA: 01/20/2018 PCP: Dakota Pal, DO  HPI/Recap of past 24 hours: [name] is a 77 year old male with past medical history significant for COPD, chronic respiratory failure on 3 L of oxygen by nasal cannula continuously, pulmonary fibrosis, paroxysmal A. fib on Eliquis, coronary artery disease status post PCI with stent, combined systolic and diastolic CHF, type II the diabetes, hypothyroidism who was admitted on 01/20/2018 due to symptomatic anemia.  Received 2 units of PRBCs.  01/21/2018: Patient seen and examined at his bedside.  He reports he feels much better today.  Denies dyspnea, chest pain, palpitations, or dizziness.  Has no new complaints.  GI is following and planning for EGD and colonoscopy tomorrow morning.  Assessment/Plan: Active Problems:   PAF (paroxysmal atrial fibrillation) (HCC)   Symptomatic anemia   Gastrointestinal hemorrhage  Dizziness suspect secondary to symptomatic anemia MRI of brain unremarkable for any acute intracranial findings PT to assess prior to discharge Fall precautions  Symptomatic anemia Resolved post 2 units of PRBC transfusion Hemoglobin 9.6 from 7.3 Repeat CBC in the morning GI consulted EGD and colonoscopy planned tomorrow 01/22/2018 Clear liquid diet now N.p.o. after midnight  Acute on chronic hypoxic respiratory failure Suspect secondary to symptomatic anemia Per RN patient desaturated overnight requiring 6 L of nasal nasal cannula Baseline O2 requirement: 3 L O2 supplementation continuously  AKI on CKD 3 Baseline creatinine 1.46 Creatinine on presentation 1.87 Improving with creatinine of 1.59 on 01/21/2018 from 1.87 Avoid nephrotoxic agents/dehydration/hypotension Repeat BMP in the morning  Paroxysmal A. Fib On Eliquis at home Continue to hold Eliquis until after procedure Rate controlled Defer to GI to resume anticoagulation  Combined systolic  and diastolic chronic CHF 2D echo done on 09/02/2017 revealed LVEF 40 to 45% with grade 2 diastolic dysfunction Strict I's and O's Daily weight Continue cardiac medications  COPD Continue COPD medications  Type 2 diabetes Continue Lantus Continue insulin sliding scale  Hypothyroidism Continue Synthroid  Code Status: Partial  Family Communication: None at bedside  Disposition Plan: home When clinically stable   Consultants:  GI  Procedures:  None  Antimicrobials:  None  DVT prophylaxis: SCDs.  Contraindication for chemical DVT prophylaxis due to suspected GI bleed.   Objective: Vitals:   01/21/18 0200 01/21/18 0350 01/21/18 0819 01/21/18 1436  BP: (!) 142/74 (!) 141/79  (!) 141/59  Pulse: 73 71  68  Resp: (!) 24 (!) 28  16  Temp: 98.9 F (37.2 C) 98.3 F (36.8 C)  98.1 F (36.7 C)  TempSrc: Oral Oral  Oral  SpO2: 91% 98% 92% 98%  Weight:      Height:        Intake/Output Summary (Last 24 hours) at 01/21/2018 1524 Last data filed at 01/21/2018 0759 Gross per 24 hour  Intake 428.33 ml  Output 875 ml  Net -446.67 ml   Filed Weights   01/20/18 1006 01/20/18 1605  Weight: 95.3 kg (210 lb) 93.4 kg (206 lb)    Exam:  . General: 77 y.o. year-old male well developed well nourished in no acute distress.  Alert and oriented x3.  On 3 L of oxygen by nasal cannula. . Cardiovascular: Regular rate and rhythm with no rubs or gallops.  No thyromegaly or JVD noted.   [name] Respiratory: Clear to auscultation with no wheezes or rales. Good inspiratory effort. . Abdomen: Soft nontender nondistended with normal bowel sounds x4 quadrants. . Musculoskeletal: No lower extremity edema. 2/4 pulses  in all 4 extremities. . Skin: No ulcerative lesions noted or rashes . Psychiatry: Mood is appropriate for condition and setting   Data Reviewed: CBC: Recent Labs  Lab 01/18/18 0832 01/20/18 0904 01/20/18 1056 01/21/18 0517  WBC 4.1 4.7 4.1  --   NEUTROABS  --  2,975 2.5   --   HGB 8.2* 7.9* 7.8* 9.6*  HCT 24.5* 25.0* 24.3* 30.4*  MCV 83.8 87.7 85.3  --   PLT 34.0 Repeated and verified X2.* 52* 48*  --    Basic Metabolic Panel: Recent Labs  Lab 01/18/18 0832 01/20/18 0904 01/20/18 1056 01/21/18 0517  NA 139 139 139 144  K 4.7 5.3* 5.1 4.8  CL 106 107 107 107  CO2 24 26 24 29   GLUCOSE 108* 122* 102* 126*  BUN 27* 27* 28* 27*  CREATININE 1.74* 1.79* 1.87* 1.59*  CALCIUM 8.6 8.9 8.2* 9.4   GFR: Estimated Creatinine Clearance: 46 mL/min (A) (by C-G formula based on SCr of 1.59 mg/dL (H)). Liver Function Tests: Recent Labs  Lab 01/18/18 0832 01/20/18 1056  AST 22 35  ALT 18 27  ALKPHOS 98 157*  BILITOT 0.6 0.8  PROT 5.6* 5.9*  ALBUMIN 3.6 3.3*   Recent Labs  Lab 01/20/18 1056  LIPASE 46   No results for input(s): AMMONIA in the last 168 hours. Coagulation Profile: No results for input(s): INR, PROTIME in the last 168 hours. Cardiac Enzymes: No results for input(s): CKTOTAL, CKMB, CKMBINDEX, TROPONINI in the last 168 hours. BNP (last 3 results) No results for input(s): PROBNP in the last 8760 hours. HbA1C: No results for input(s): HGBA1C in the last 72 hours. CBG: Recent Labs  Lab 01/21/18 0741  GLUCAP 92   Lipid Profile: No results for input(s): CHOL, HDL, LDLCALC, TRIG, CHOLHDL, LDLDIRECT in the last 72 hours. Thyroid Function Tests: Recent Labs    01/20/18 0904  FREET4 1.79*   Anemia Panel: No results for input(s): VITAMINB12, FOLATE, FERRITIN, TIBC, IRON, RETICCTPCT in the last 72 hours. Urine analysis:    Component Value Date/Time   COLORURINE YELLOW 01/20/2018 [place] 01/20/2018 1253   LABSPEC 1.015 01/20/2018 1253   PHURINE 5.5 01/20/2018 1253   GLUCOSEU NEGATIVE 01/20/2018 1253   GLUCOSEU 100 (A) 10/02/2016 1045   HGBUR NEGATIVE 01/20/2018 1253   BILIRUBINUR NEGATIVE 01/20/2018 1253   BILIRUBINUR negative 07/06/2014 1704   KETONESUR NEGATIVE 01/20/2018 1253   PROTEINUR NEGATIVE  01/20/2018 1253   UROBILINOGEN 0.2 10/02/2016 1045   NITRITE NEGATIVE 01/20/2018 1253   LEUKOCYTESUR NEGATIVE 01/20/2018 1253   Sepsis Labs: @LABRCNTIP (procalcitonin:4,lacticidven:4)  )No results found for this or any previous visit (from the past 240 hour(s)).    Studies: Mr Brain Wo Contrast  Result Date: 01/20/2018 CLINICAL DATA:  Dizziness. EXAM: MRI HEAD WITHOUT CONTRAST TECHNIQUE: Multiplanar, multiecho pulse sequences of the brain and surrounding structures were obtained without intravenous contrast. COMPARISON:  Head CT 01/20/2018 MR brain 01/30/2010 FINDINGS: BRAIN: There is no acute infarct, acute hemorrhage or mass effect. The midline structures are normal. There are no old infarcts. Multifocal white matter hyperintensity, most commonly due to chronic ischemic microangiopathy. Generalized atrophy without lobar predilection. Susceptibility-sensitive sequences show no chronic microhemorrhage or superficial siderosis. VASCULAR: Major intracranial arterial and venous sinus flow voids are preserved. SKULL AND UPPER CERVICAL SPINE: The visualized skull base, calvarium, upper cervical spine and extracranial soft tissues are normal. SINUSES/ORBITS: No fluid levels or advanced mucosal thickening. No mastoid or middle ear effusion. The orbits are normal. IMPRESSION:  Generalized atrophy and chronic small vessel ischemia without acute intracranial abnormality. Electronically Signed   By: Ulyses Jarred M.D.   On: 01/20/2018 22:01    Scheduled Meds: . sodium chloride   Intravenous Once  . ALPRAZolam  0.5 mg Oral QHS  . amiodarone  200 mg Oral Daily  . atorvastatin  40 mg Oral Daily  . carvedilol  3.125 mg Oral BID WC  . insulin aspart  0-15 Units Subcutaneous TID WC  . insulin glargine  45 Units Subcutaneous QHS  . levothyroxine  125 mcg Oral QAC breakfast  . losartan  50 mg Oral Daily  . metoCLOPramide (REGLAN) injection  5 mg Intravenous BID  . peg 3350 powder  0.5 kit Oral Once   And  .  peg 3350 powder  0.5 kit Oral Once  . pregabalin  50 mg Oral BID  . tamsulosin  0.4 mg Oral QHS  . tiotropium  18 mcg Inhalation Daily    Continuous Infusions:   LOS: 1 day     Kayleen Memos, MD [facility] Pager [phone]  If 7PM-7AM, please contact night-coverage [email or web address] Password [facility] 01/21/2018, 3:24 PM

## 2018-01-22 ENCOUNTER — Encounter (HOSPITAL_COMMUNITY)
Admission: EM | Disposition: A | Discharge status: Home / Self Care | Payer: Self-pay | Source: Home / Self Care | Attending: Internal Medicine

## 2018-01-22 ENCOUNTER — Encounter (HOSPITAL_COMMUNITY): Payer: Self-pay | Admitting: Internal Medicine

## 2018-01-22 DIAGNOSIS — D693 Immune thrombocytopenic purpura: Secondary | ICD-10-CM | POA: Diagnosis present

## 2018-01-22 DIAGNOSIS — K5520 Angiodysplasia of colon without hemorrhage: Secondary | ICD-10-CM

## 2018-01-22 DIAGNOSIS — K5730 Diverticulosis of large intestine without perforation or abscess without bleeding: Secondary | ICD-10-CM

## 2018-01-22 DIAGNOSIS — D696 Thrombocytopenia, unspecified: Secondary | ICD-10-CM | POA: Diagnosis present

## 2018-01-22 DIAGNOSIS — K649 Unspecified hemorrhoids: Secondary | ICD-10-CM

## 2018-01-22 HISTORY — PX: COLONOSCOPY WITH PROPOFOL: SHX5780

## 2018-01-22 HISTORY — PX: ESOPHAGOGASTRODUODENOSCOPY (EGD) WITH PROPOFOL: SHX5813

## 2018-01-22 HISTORY — PX: HOT HEMOSTASIS: SHX5433

## 2018-01-22 LAB — CBC
HCT: 31.4 % — ABNORMAL LOW (ref 39.0–52.0)
Hemoglobin: 10 g/dL — ABNORMAL LOW (ref 13.0–17.0)
MCH: 27.9 pg (ref 26.0–34.0)
MCHC: 31.8 g/dL (ref 30.0–36.0)
MCV: 87.5 fL (ref 78.0–100.0)
PLATELETS: 64 10*3/uL — AB (ref 150–400)
RBC: 3.59 MIL/uL — ABNORMAL LOW (ref 4.22–5.81)
RDW: 15.6 % — ABNORMAL HIGH (ref 11.5–15.5)
WBC: 5.4 10*3/uL (ref 4.0–10.5)

## 2018-01-22 LAB — GLUCOSE, CAPILLARY
GLUCOSE-CAPILLARY: 105 mg/dL — AB (ref 70–99)
GLUCOSE-CAPILLARY: 169 mg/dL — AB (ref 70–99)
Glucose-Capillary: 189 mg/dL — ABNORMAL HIGH (ref 70–99)
Glucose-Capillary: 195 mg/dL — ABNORMAL HIGH (ref 70–99)
Glucose-Capillary: 129 mg/dL — ABNORMAL HIGH (ref 70–99)
Glucose-Capillary: 76 mg/dL (ref 70–99)
Glucose-Capillary: 121 mg/dL — ABNORMAL HIGH (ref 70–99)
Glucose-Capillary: 167 mg/dL — ABNORMAL HIGH (ref 70–99)

## 2018-01-22 LAB — COMPREHENSIVE METABOLIC PANEL
ALT: 37 U/L (ref 0–44)
AST: 46 U/L — ABNORMAL HIGH (ref 15–41)
Albumin: 3.4 g/dL — ABNORMAL LOW (ref 3.5–5.0)
Alkaline Phosphatase: 207 U/L — ABNORMAL HIGH (ref 38–126)
Anion gap: 12 (ref 5–15)
BUN: 24 mg/dL — ABNORMAL HIGH (ref 8–23)
CHLORIDE: 111 mmol/L (ref 98–111)
CO2: 23 mmol/L (ref 22–32)
Calcium: 9 mg/dL (ref 8.9–10.3)
Creatinine, Ser: 1.73 mg/dL — ABNORMAL HIGH (ref 0.61–1.24)
GFR, EST AFRICAN AMERICAN: 42 mL/min — AB (ref >60)
GFR, EST NON AFRICAN AMERICAN: 37 mL/min — AB (ref >60)
Glucose, Bld: 95 mg/dL (ref 70–99)
POTASSIUM: 4.9 mmol/L (ref 3.5–5.1)
SODIUM: 146 mmol/L — AB (ref 135–145)
Total Bilirubin: 0.9 mg/dL (ref 0.3–1.2)
Total Protein: 6.2 g/dL — ABNORMAL LOW (ref 6.5–8.1)

## 2018-01-22 LAB — VITAMIN B12: VITAMIN B 12: 834 pg/mL (ref 180–914)

## 2018-01-22 LAB — FERRITIN: FERRITIN: 109 ng/mL (ref 24–336)

## 2018-01-22 SURGERY — ESOPHAGOGASTRODUODENOSCOPY (EGD) WITH PROPOFOL
Anesthesia: Moderate Sedation

## 2018-01-22 MED ORDER — FENTANYL CITRATE (PF) 100 MCG/2ML IJ SOLN
INTRAMUSCULAR | Status: DC | PRN
  Administered 2018-01-22 (×2): 25 ug via INTRAVENOUS

## 2018-01-22 MED ORDER — SPOT INK MARKER SYRINGE KIT
PACK | SUBMUCOSAL | Status: AC
  Filled 2018-01-22: qty 5

## 2018-01-22 MED ORDER — FENTANYL CITRATE (PF) 100 MCG/2ML IJ SOLN
INTRAMUSCULAR | Status: AC
  Filled 2018-01-22: qty 4

## 2018-01-22 MED ORDER — BUTAMBEN-TETRACAINE-BENZOCAINE 2-2-14 % EX AERO
INHALATION_SPRAY | CUTANEOUS | Status: DC | PRN
  Administered 2018-01-22: 2 via TOPICAL

## 2018-01-22 MED ORDER — DIPHENHYDRAMINE HCL 50 MG/ML IJ SOLN
INTRAMUSCULAR | Status: AC
  Filled 2018-01-22: qty 1

## 2018-01-22 MED ORDER — EPINEPHRINE PF 1 MG/10ML IJ SOSY
PREFILLED_SYRINGE | INTRAMUSCULAR | Status: AC
  Filled 2018-01-22: qty 10

## 2018-01-22 MED ORDER — MIDAZOLAM HCL 5 MG/ML IJ SOLN
INTRAMUSCULAR | Status: AC
  Filled 2018-01-22: qty 3

## 2018-01-22 MED ORDER — MIDAZOLAM HCL 10 MG/2ML IJ SOLN
INTRAMUSCULAR | Status: DC | PRN
  Administered 2018-01-22: 2 mg via INTRAVENOUS
  Administered 2018-01-22: 1 mg via INTRAVENOUS
  Administered 2018-01-22: 2 mg via INTRAVENOUS

## 2018-01-22 MED ORDER — DEXTROSE-NACL 5-0.45 % IV SOLN
INTRAVENOUS | Status: DC
  Administered 2018-01-22: 08:00:00 via INTRAVENOUS

## 2018-01-22 SURGICAL SUPPLY — 25 items

## 2018-01-22 NOTE — Progress Notes (Addendum)
    Not mentioned yesterday as a problem is chronic and transiently worsened thrombocytopenia  This needs a work-up - heme eval - I do not think he has had one  Can be outpatient but needs attention  I suspect he can resume anti-coag Sunday as I have recommended, despite the thrombocytopenia if he stays 50+  If he goes home today he should not drive until tomorrow.  Here are GI dc instructions  YOU HAD AN ENDOSCOPIC PROCEDURE TODAY: Refer to the procedure report and other information in the discharge instructions given to you for any specific questions about what was found during the examination. If this information does not answer your questions, please call Dr. Marvell Fuller office at [phone] to clarify.   YOU SHOULD EXPECT: Some feelings of bloating in the abdomen. Passage of more gas than usual. Walking can help get rid of the air that was put into your GI tract during the procedure and reduce the bloating. If you had a lower endoscopy (such as a colonoscopy or flexible sigmoidoscopy) you may notice spotting of blood in your stool or on the toilet paper. Some abdominal soreness may be present for a day or two, also.  DIET: Your first meal following the procedure should be a light meal and then it is ok to progress to your normal diet. A half-sandwich or bowl of soup is an example of a good first meal. Heavy or fried foods are harder to digest and may make you feel nauseous or bloated. Drink plenty of fluids but you should avoid alcoholic beverages for 24 hours.   ACTIVITY: Your care partner should take you home directly after the procedure. You should plan to take it easy, moving slowly for the rest of the day. You can resume normal activity the day after the procedure however YOU SHOULD NOT DRIVE, use power tools, machinery or perform tasks that involve climbing or major physical exertion for 24 hours (because of the sedation medicines used during the test).   SYMPTOMS TO REPORT  IMMEDIATELY: A gastroenterologist can be reached at any hour. Please call [phone]  for any of the following symptoms:  Following lower endoscopy (colonoscopy, flexible sigmoidoscopy) Excessive amounts of blood in the stool  Significant tenderness, worsening of abdominal pains  Swelling of the abdomen that is new, acute  Fever of 100 or higher  Following upper endoscopy (EGD, EUS, ERCP, esophageal dilation) Vomiting of blood or coffee ground material  New, significant abdominal pain  New, significant chest pain or pain under the shoulder blades  Painful or persistently difficult swallowing  New shortness of breath  Black, tarry-looking or red, bloody stools  FOLLOW UP:  If any biopsies were taken you will be contacted by phone or by letter within the next 1-3 weeks. Call [phone]  if you have not heard about the biopsies in 3 weeks.  Please also call with any specific questions about appointments or follow up tests.

## 2018-01-22 NOTE — Progress Notes (Signed)
Patient declines nocturnal CPAP at this time and requests that the equipment remain as is at bedside. He is aware that he may request asistance at any time if he should become more compliant. RT will continue to follow.

## 2018-01-22 NOTE — Op Note (Signed)
[facility] Patient Name: [name] Procedure Date: 01/22/2018 MRN: [id number] Attending MD: Iva Boop , MD Date of Birth: Apr 05, 1941 CSN: [id number] Age: 77 Admit Type: Inpatient Procedure:                Colonoscopy Indications:              Heme positive stool, anemia Providers:                Iva Boop, MD, Tomma Rakers, RN, Harrington Challenger, Technician Referring MD:              Medicines:                See the other procedure note for documentation of                            the administered medications Complications:            No immediate complications. Estimated Blood Loss:     Estimated blood loss: none. Procedure:                Pre-Anesthesia Assessment:                           - Prior to the procedure, a History and Physical                            was performed, and patient medications and                            allergies were reviewed. The patient's tolerance of                            previous anesthesia was also reviewed. The risks                            and benefits of the procedure and the sedation                            options and risks were discussed with the patient.                            All questions were answered, and informed consent                            was obtained. Prior Anticoagulants: The patient                            last took Eliquis (apixaban) 3 days prior to the                            procedure. ASA Grade Assessment: III - A patient  with severe systemic disease. After reviewing the                            risks and benefits, the patient was deemed in                            satisfactory condition to undergo the procedure.                           After obtaining informed consent, the colonoscope                            was passed under direct vision. Throughout the                            procedure, the  patient's blood pressure, pulse, and                            oxygen saturations were monitored continuously. The                            [id number] ([id number]) scope was introduced through                            the anus and advanced to the the terminal ileum,                            with identification of the appendiceal orifice and                            IC valve. The colonoscopy was performed without                            difficulty. The patient tolerated the procedure                            well. The quality of the bowel preparation was                            good. The terminal ileum, ileocecal valve,                            appendiceal orifice, and rectum were photographed. Scope In: 12:52:46 PM Scope Out: 1:07:36 PM Scope Withdrawal Time: 0 hours 8 minutes 28 seconds  Total Procedure Duration: 0 hours 14 minutes 50 seconds  Findings:      The perianal and digital rectal examinations were normal. Pertinent       negatives include normal prostate (size, shape, and consistency).      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       Coagulation for bleeding prevention using argon plasma was successful.       Estimated blood loss: none. To prevent bleeding post-intervention, two       hemostatic clips were successfully placed (MR conditional). There was no  bleeding during, or at the end, of the procedure.      Multiple diverticula were found in the left colon.      The exam was otherwise without abnormality on direct and retroflexion       views. Impression:               - A single colonic angiodysplastic lesion. Treated                            with argon plasma coagulation (APC). Clips (MR                            conditional) were placed.                           - Diverticulosis in the left colon.                           - The examination was otherwise normal on direct                            and retroflexion  views.                           - No specimens collected. Moderate Sedation:      Moderate (conscious) sedation was administered by the endoscopy nurse       and supervised by the endoscopist. The following parameters were       monitored: oxygen saturation, heart rate, blood pressure, respiratory       rate, EKG, adequacy of pulmonary ventilation, and response to care. Recommendation:           - Return patient to hospital ward for ongoing care.                           - Resume Eliquis from my standpoint in 2 days                            hoever he has sig thrombocytopenia which is better                            so not sutre                           He wants to go home to be with ill wife and from my                            standpoint that is ok but he has long-standing                            thrombocytopenia and I cannot tell it has been                            addressed before.  He does not appear to have cirrhosis or portal htn                            thankfullly.                           He needs a hematology evaluation though not                            urgently.                           He could have become anemic leaking blood from the                            cecal AVM - I di go ahead and check a ferritin and                            B12 though transfusion could alter results                           He does not need specific GI f/u or GI meds                           He should talke ferrous sulfate and if                            iron-deficient I would do a ferraheme infusions Procedure Code(s):        --- Professional ---                           [phone], Colonoscopy, flexible; with control of                            bleeding, any method Diagnosis Code(s):        --- Professional ---                           K55.20, Angiodysplasia of colon without hemorrhage                           K64.9, Unspecified  hemorrhoids                           R19.5, Other fecal abnormalities                           K57.30, Diverticulosis of large intestine without                            perforation or abscess without bleeding CPT copyright 2017 [employer]. All rights reserved. The codes documented in this report are preliminary and upon coder review may  be revised to meet current compliance requirements. Iva Boop, MD 01/22/2018 1:29:06 PM This report has been signed electronically. Number of Addenda: 0

## 2018-01-22 NOTE — Op Note (Signed)
[facility] Patient Name: [name] Procedure Date: 01/22/2018 MRN: [id number] Attending MD: Iva Boop , MD Date of Birth: 1941-01-21 CSN: [id number] Age: 77 Admit Type: Inpatient Procedure:                Upper GI endoscopy Indications:              Heme positive stool and anemia Providers:                Iva Boop, MD, Tomma Rakers, RN, Harrington Challenger, Technician Referring MD:              Medicines:                Midazolam 5 mg IV, Fentanyl 50 micrograms IV Complications:            No immediate complications. Estimated Blood Loss:      Procedure:                After obtaining informed consent, the endoscope was                            passed under direct vision. Throughout the                            procedure, the patient's blood pressure, pulse, and                            oxygen saturations were monitored continuously. The                            [id number] ) was introduced through the                            mouth, and advanced to the second part of duodenum.                            The upper GI endoscopy was accomplished without                            difficulty. The patient tolerated the procedure                            well. Scope In: Scope Out: Findings:      The esophagus was normal.      The stomach was normal.      The examined duodenum was normal. Impression:               - Normal esophagus.                           - Normal stomach.                           - Normal examined duodenum.                           -  No specimens collected. Moderate Sedation:      Moderate (conscious) sedation was administered by the endoscopy nurse       and supervised by the endoscopist. The following parameters were       monitored: oxygen saturation, heart rate, blood pressure, respiratory       rate, EKG, adequacy of pulmonary ventilation, and response to care. Recommendation:            - See the other procedure note for documentation of                            additional recommendations. Procedure Code(s):        --- Professional ---                           [phone], Esophagogastroduodenoscopy, flexible,                            transoral; diagnostic, including collection of                            specimen(s) by brushing or washing, when performed                            (separate procedure) Diagnosis Code(s):        --- Professional ---                           R19.5, Other fecal abnormalities CPT copyright 2017 [employer] Association. All rights reserved. The codes documented in this report are preliminary and upon coder review may  be revised to meet current compliance requirements. Iva Boop, MD 01/22/2018 1:20:44 PM This report has been signed electronically. Number of Addenda: 0

## 2018-01-22 NOTE — Progress Notes (Signed)
PROGRESS NOTE  [name] OIB:[id number] DOB: 1940/10/20 DOA: 01/20/2018 PCP: Sharlene Dory, DO  HPI/Recap of past 24 hours: [name] is a 77 year old male with past medical history significant for COPD, chronic respiratory failure on 3 L of oxygen by nasal cannula continuously, pulmonary fibrosis, paroxysmal A. fib on Eliquis, coronary artery disease status post PCI with stent, combined systolic and diastolic CHF, type II the diabetes, hypothyroidism who was admitted on 01/20/2018 due to symptomatic anemia.  Received 2 units of PRBCs.  01/21/2018: Patient seen and examined at his bedside.  He reports he feels much better today.  Denies dyspnea, chest pain, palpitations, or dizziness.  Has no new complaints.  GI is following and planning for EGD and colonoscopy tomorrow morning.  01/22/2018: Patient seen and examined at his bedside.  No new complaints today except that he is hungry.  EGD and colonoscopy planned this afternoon at 1:30 PM.  Assessment/Plan: Active Problems:   PAF (paroxysmal atrial fibrillation) (HCC)   Symptomatic anemia   Gastrointestinal hemorrhage  Resolved dizziness suspect secondary to symptomatic anemia MRI of brain unremarkable for any acute intracranial findings PT to assess prior to discharge Fall precautions  Resolved symptomatic anemia Resolved post 2 units of PRBC transfusion Hemoglobin 10.0 today 01/22/2018 Hemoglobin 9.6 01/21/2018 from 7.3 on admission EGD and colonoscopy planned 01/22/2018 N.p.o. until after procedures  Acute on chronic hypoxic respiratory failure Self-reported this morning at baseline on 2 L of oxygen at rest and on 3 L upon ambulation Currently on 4 L by nasal cannula Will attempt to wean off down to his baseline after his procedures this afternoon  AKI on CKD 3 Creatinine is worsening this morning from 1.59 to 1.73 Baseline creatinine 1.46 Creatinine on presentation 1.87 Avoid nephrotoxic  agents/dehydration/hypotension Repeat BMP in the morning  Paroxysmal A. Fib Rate controlled On Eliquis at home Continue to hold Eliquis until after procedures Defer to GI to restart anticoagulation  Combined systolic and diastolic chronic CHF 2D echo done on 09/02/2017 revealed LVEF 40 to 45% with grade 2 diastolic dysfunction Strict I's and O's Daily weight Continue cardiac medications  COPD Continue COPD medications  Type 2 diabetes Last hemoglobin A1c 9.3 on 09/03/2017 Continue Lantus Continue insulin sliding scale  Hypothyroidism Continue Synthroid  Code Status: Partial  Family Communication: None at bedside  Disposition Plan: home When clinically stable   Consultants:  GI  Procedures:  None  Antimicrobials:  None  DVT prophylaxis: SCDs.  Eliquis on hold due to suspected GI bleed.   Objective: Vitals:   01/21/18 2015 01/22/18 0530 01/22/18 0739 01/22/18 0817  BP: (!) 128/59 (!) 144/61    Pulse: 89 78    Resp: 18 20    Temp: 99.5 F (37.5 C) 98.2 F (36.8 C)    TempSrc: Oral Oral    SpO2: 95% 96% (!) 88% 95%  Weight: 94 kg (207 lb 3.7 oz) 93.6 kg (206 lb 5.6 oz)    Height:        Intake/Output Summary (Last 24 hours) at 01/22/2018 1042 Last data filed at 01/22/2018 0009 Gross per 24 hour  Intake 1480 ml  Output -  Net 1480 ml   Filed Weights   01/20/18 1605 01/21/18 2015 01/22/18 0530  Weight: 93.4 kg (206 lb) 94 kg (207 lb 3.7 oz) 93.6 kg (206 lb 5.6 oz)    Exam:  . General: 77 y.o. year-old male well-developed well-nourished in no acute distress.  Alert and oriented x3.  On 4  L of oxygen by nasal cannula. . Cardiovascular: Regular rate and rhythm with no rubs or gallops.  No thyromegaly or JVD present. [name] Respiratory: Clear to auscultation with no wheezes or rales. Good inspiratory effort. . Abdomen: Soft nontender nondistended with normal bowel sounds x4 quadrants. . Musculoskeletal: No lower extremity edema. 2/4 pulses in all 4  extremities. . Skin: No ulcerative lesions noted or rashes . Psychiatry: Mood is appropriate for condition and setting   Data Reviewed: CBC: Recent Labs  Lab 01/18/18 0832 01/20/18 0904 01/20/18 1056 01/21/18 0517 01/22/18 0542  WBC 4.1 4.7 4.1  --  5.4  NEUTROABS  --  2,975 2.5  --   --   HGB 8.2* 7.9* 7.8* 9.6* 10.0*  HCT 24.5* 25.0* 24.3* 30.4* 31.4*  MCV 83.8 87.7 85.3  --  87.5  PLT 34.0 Repeated and verified X2.* 52* 48*  --  64*   Basic Metabolic Panel: Recent Labs  Lab 01/18/18 0832 01/20/18 0904 01/20/18 1056 01/21/18 0517 01/22/18 0542  NA 139 139 139 144 146*  K 4.7 5.3* 5.1 4.8 4.9  CL 106 107 107 107 111  CO2 24 26 24 29 23   GLUCOSE 108* 122* 102* 126* 95  BUN 27* 27* 28* 27* 24*  CREATININE 1.74* 1.79* 1.87* 1.59* 1.73*  CALCIUM 8.6 8.9 8.2* 9.4 9.0   GFR: Estimated Creatinine Clearance: 42.2 mL/min (A) (by C-G formula based on SCr of 1.73 mg/dL (H)). Liver Function Tests: Recent Labs  Lab 01/18/18 0832 01/20/18 1056 01/22/18 0542  AST 22 35 46*  ALT 18 27 37  ALKPHOS 98 157* 207*  BILITOT 0.6 0.8 0.9  PROT 5.6* 5.9* 6.2*  ALBUMIN 3.6 3.3* 3.4*   Recent Labs  Lab 01/20/18 1056  LIPASE 46   No results for input(s): AMMONIA in the last 168 hours. Coagulation Profile: No results for input(s): INR, PROTIME in the last 168 hours. Cardiac Enzymes: No results for input(s): CKTOTAL, CKMB, CKMBINDEX, TROPONINI in the last 168 hours. BNP (last 3 results) No results for input(s): PROBNP in the last 8760 hours. HbA1C: No results for input(s): HGBA1C in the last 72 hours. CBG: Recent Labs  Lab 01/21/18 0741 01/21/18 1706  GLUCAP 92 126*   Lipid Profile: No results for input(s): CHOL, HDL, LDLCALC, TRIG, CHOLHDL, LDLDIRECT in the last 72 hours. Thyroid Function Tests: Recent Labs    01/20/18 0904  FREET4 1.79*   Anemia Panel: No results for input(s): VITAMINB12, FOLATE, FERRITIN, TIBC, IRON, RETICCTPCT in the last 72 hours. Urine  analysis:    Component Value Date/Time   COLORURINE YELLOW 01/20/2018 1253   APPEARANCEUR CLEAR 01/20/2018 1253   LABSPEC 1.015 01/20/2018 1253   PHURINE 5.5 01/20/2018 1253   GLUCOSEU NEGATIVE 01/20/2018 1253   GLUCOSEU 100 (A) 10/02/2016 1045   HGBUR NEGATIVE 01/20/2018 1253   BILIRUBINUR NEGATIVE 01/20/2018 1253   BILIRUBINUR negative 07/06/2014 1704   KETONESUR NEGATIVE 01/20/2018 1253   PROTEINUR NEGATIVE 01/20/2018 1253   UROBILINOGEN 0.2 10/02/2016 1045   NITRITE NEGATIVE 01/20/2018 1253   LEUKOCYTESUR NEGATIVE 01/20/2018 1253   Sepsis Labs: @LABRCNTIP (procalcitonin:4,lacticidven:4)  )No results found for this or any previous visit (from the past 240 hour(s)).    Studies: No results found.  Scheduled Meds: . sodium chloride   Intravenous Once  . ALPRAZolam  0.5 mg Oral QHS  . amiodarone  200 mg Oral Daily  . atorvastatin  40 mg Oral Daily  . carvedilol  3.125 mg Oral BID WC  . insulin aspart  0-15 Units Subcutaneous TID WC  . insulin glargine  45 Units Subcutaneous QHS  . levothyroxine  125 mcg Oral QAC breakfast  . losartan  50 mg Oral Daily  . pregabalin  50 mg Oral BID  . tamsulosin  0.4 mg Oral QHS  . tiotropium  18 mcg Inhalation Daily    Continuous Infusions: . dextrose 5 % and 0.45% NaCl 75 mL/hr at 01/22/18 0818     LOS: 2 days     Darlin Drop, MD [facility] Pager [phone]  If 7PM-7AM, please contact night-coverage [email or web address] Password [facility] 01/22/2018, 10:42 AM

## 2018-01-22 NOTE — Progress Notes (Signed)
Patient NPO for procedure.

## 2018-01-22 NOTE — Care Management Important Message (Signed)
Important Message  Patient Details  Name: STONEY KARCZEWSKI MRN: [id number] Date of Birth: [birth year]   Medicare Important Message Given:  Yes    Caren Macadam 01/22/2018, 10:00 AMImportant Message  Patient Details  Name: CAESON FILIPPI MRN: [id number] Date of Birth: [birth year]   Medicare Important Message Given:  Yes    Caren Macadam 01/22/2018, 9:58 AM

## 2018-01-22 NOTE — Interval H&P Note (Signed)
History and Physical Interval Note:  01/22/2018 12:41 PM  [name]  has presented today for surgery, with the diagnosis of Symptomatic Anemia  The various methods of treatment have been discussed with the patient and family. After consideration of risks, benefits and other options for treatment, the patient has consented to  Procedure(s): ESOPHAGOGASTRODUODENOSCOPY (EGD) WITH PROPOFOL (N/A) COLONOSCOPY WITH PROPOFOL (N/A) as a surgical intervention .  The patient's history has been reviewed, patient examined, no change in status, stable for surgery.  I have reviewed the patient's chart and labs.  Questions were answered to the patient's satisfaction.     Stan Head

## 2018-01-23 ENCOUNTER — Inpatient Hospital Stay (HOSPITAL_COMMUNITY): Payer: Medicare Other

## 2018-01-23 DIAGNOSIS — R740 Nonspecific elevation of levels of transaminase and lactic acid dehydrogenase [LDH]: Secondary | ICD-10-CM

## 2018-01-23 LAB — COMPREHENSIVE METABOLIC PANEL
ALK PHOS: 327 U/L — AB (ref 38–126)
ALT: 63 U/L — AB (ref 0–44)
ANION GAP: 10 (ref 5–15)
AST: 79 U/L — ABNORMAL HIGH (ref 15–41)
Albumin: 3.3 g/dL — ABNORMAL LOW (ref 3.5–5.0)
BILIRUBIN TOTAL: 1.6 mg/dL — AB (ref 0.3–1.2)
BUN: 34 mg/dL — ABNORMAL HIGH (ref 8–23)
CALCIUM: 8.3 mg/dL — AB (ref 8.9–10.3)
CO2: 23 mmol/L (ref 22–32)
Chloride: 106 mmol/L (ref 98–111)
Creatinine, Ser: 1.96 mg/dL — ABNORMAL HIGH (ref 0.61–1.24)
GFR calc Af Amer: 36 mL/min — ABNORMAL LOW (ref >60)
GFR calc non Af Amer: 31 mL/min — ABNORMAL LOW (ref >60)
GLUCOSE: 213 mg/dL — AB (ref 70–99)
Potassium: 4.7 mmol/L (ref 3.5–5.1)
Sodium: 139 mmol/L (ref 135–145)
Total Protein: 6.3 g/dL — ABNORMAL LOW (ref 6.5–8.1)

## 2018-01-23 LAB — FERRITIN: FERRITIN: 185 ng/mL (ref 24–336)

## 2018-01-23 LAB — CBC
HEMATOCRIT: 29 % — AB (ref 39.0–52.0)
HEMOGLOBIN: 9.3 g/dL — AB (ref 13.0–17.0)
MCH: 27.8 pg (ref 26.0–34.0)
MCHC: 32.1 g/dL (ref 30.0–36.0)
MCV: 86.6 fL (ref 78.0–100.0)
Platelets: 81 10*3/uL — ABNORMAL LOW (ref 150–400)
RBC: 3.35 MIL/uL — ABNORMAL LOW (ref 4.22–5.81)
RDW: 15.8 % — AB (ref 11.5–15.5)
WBC: 6.5 10*3/uL (ref 4.0–10.5)

## 2018-01-23 LAB — RETICULOCYTES
RBC.: 3.35 MIL/uL — ABNORMAL LOW (ref 4.22–5.81)
RETIC CT PCT: 1.5 % (ref 0.4–3.1)
Retic Count, Absolute: 50.3 10*3/uL (ref 19.0–186.0)

## 2018-01-23 LAB — GLUCOSE, CAPILLARY
GLUCOSE-CAPILLARY: 146 mg/dL — AB (ref 70–99)
GLUCOSE-CAPILLARY: 143 mg/dL — AB (ref 70–99)
Glucose-Capillary: 147 mg/dL — ABNORMAL HIGH (ref 70–99)
Glucose-Capillary: 202 mg/dL — ABNORMAL HIGH (ref 70–99)

## 2018-01-23 LAB — VITAMIN B12: VITAMIN B 12: 726 pg/mL (ref 180–914)

## 2018-01-23 LAB — IRON AND TIBC
Iron: 28 ug/dL — ABNORMAL LOW (ref 45–182)
Saturation Ratios: 9 % — ABNORMAL LOW (ref 17.9–39.5)
TIBC: 317 ug/dL (ref 250–450)
UIBC: 289 ug/dL

## 2018-01-23 LAB — LACTATE DEHYDROGENASE: LDH: 180 U/L (ref 98–192)

## 2018-01-23 LAB — SAVE SMEAR

## 2018-01-23 MED ORDER — FERROUS SULFATE 325 (65 FE) MG PO TABS
325.0000 mg | ORAL_TABLET | Freq: Every day | ORAL | Status: DC
  Administered 2018-01-23 – 2018-01-24 (×2): 325 mg via ORAL
  Filled 2018-01-23 (×2): qty 1

## 2018-01-23 MED ORDER — SODIUM CHLORIDE 0.9 % IV BOLUS
250.0000 mL | Freq: Once | INTRAVENOUS | Status: AC
  Administered 2018-01-23: 250 mL via INTRAVENOUS

## 2018-01-23 MED ORDER — CARVEDILOL 3.125 MG PO TABS
3.1250 mg | ORAL_TABLET | Freq: Every day | ORAL | Status: DC
  Administered 2018-01-24: 3.125 mg via ORAL
  Filled 2018-01-23: qty 1

## 2018-01-23 NOTE — Progress Notes (Addendum)
PROGRESS NOTE  NOBLE CICALESE UXN:[id number] DOB: 07-30-1940 DOA: 01/20/2018 PCP: Sharlene Dory, DO  HPI/Recap of past 24 hours: [name] is a 77 year old male with past medical history significant for COPD, chronic respiratory failure on 3 L of oxygen by nasal cannula continuously, pulmonary fibrosis, paroxysmal A. fib on Eliquis, coronary artery disease status post PCI with stent, combined systolic and diastolic CHF, type II the diabetes, hypothyroidism who was admitted on 01/20/2018 due to symptomatic anemia.  Received 2 units of PRBCs.  01/21/2018: Patient seen and examined at his bedside.  He reports he feels much better today.  Denies dyspnea, chest pain, palpitations, or dizziness.  Has no new complaints.  GI is following and planning for EGD and colonoscopy tomorrow morning.  01/22/2018: Patient seen and examined at his bedside.  No new complaints today except that he is hungry.  EGD and colonoscopy planned this afternoon at 1:30 PM.  01/23/2017: Patient seen and examined at bedside no new complaints.  UPDATE: RN reports dizziness upon standing and hypotension. Obtain orthostatic vital signs.  Assessment/Plan: Active Problems:   PAF (paroxysmal atrial fibrillation) (HCC)   Symptomatic anemia   Thrombocytopenia (HCC)  Dizziness suspect secondary to symptomatic anemia vs others MRI of brain unremarkable for any acute intracranial findings PT to assess prior to discharge Fall precautions Obtain orthosthatic vital signs  Hypotension Maintain MAP 65 or above Give 250 cc NS once Continue to hold losartan due to AKI Hold coreg dose this pm Obtain orthostatic vital signs  Cholelithiasis with no sign of acute cholecystitis Found on abdominal U/S done today 01/23/18 Denies abdominal pain  Suspected acute blood loss anemia post 2 units of PRBC transfusion Post EGD on 01/22/2018 unremarkable Hemoglobin 9.3 from 10.0 yesterday No sign of overt bleeding Anemia  work-up revealed iron deficiency anemia Start ferrous sulfate p.o. 325 daily Normal LDH  Chronic thrombocytopenia with unclear etiology Platelet 81 from 64 Peripheral smear ordered pending results  Acute transaminitis Alkaline phosphatase, AST, ALT trending up Ordered abdominal ultrasound pending results Viral hepatitis panel results pending Repeat LFTs in the morning  Hyperbilirubinemia Abdominal ultrasound ordered, pending results Repeat CMP in the morning  Acute on chronic hypoxic respiratory failure Self-reported at baseline on 2 L of oxygen at rest and on 3 L upon ambulation Currently on 3 L by nasal cannula  AKI on CKD 3 Creatinine is worsening Baseline creatinine 1.46 Creatinine on presentation 1.87 Avoid nephrotoxic agents/dehydration/hypotension Stop losartan Follow-up with cardiology within a week post discharge Repeat BMP in the morning  Paroxysmal A. Fib Rate controlled On Eliquis at home Resume Eliquis on Sunday, 01/24/2018 if platelets above 50 Repeat CBC in the morning  Combined systolic and diastolic chronic CHF 2D echo done on 09/02/2017 revealed LVEF 40 to 45% with grade 2 diastolic dysfunction Strict I's and O's Daily weight Continue cardiac medications  COPD Continue COPD medications  Type 2 diabetes Last hemoglobin A1c 9.3 on 09/03/2017 Continue Lantus Continue insulin sliding scale  Hypothyroidism Continue Synthroid  Code Status: Partial  Family Communication: None at bedside  Disposition Plan: home When clinically stable   Consultants:  GI  Procedures:  None  Antimicrobials:  None  DVT prophylaxis: SCDs.  Eliquis on hold will resume on Sunday, 01/24/2018 if platelets level stable   Objective: Vitals:   01/22/18 1948 01/23/18 0456 01/23/18 0500 01/23/18 0939  BP: (!) 119/52 (!) 140/56    Pulse: 62 78    Resp: (!) 21 (!) 24    Temp: 99.1  F (37.3 C) 99 F (37.2 C)    TempSrc: Oral Oral    SpO2: 100% 93%  97%    Weight:   91.3 kg (201 lb 3.2 oz)   Height:        Intake/Output Summary (Last 24 hours) at 01/23/2018 1436 Last data filed at 01/23/2018 0930 Gross per 24 hour  Intake 1047.5 ml  Output -  Net 1047.5 ml   Filed Weights   01/22/18 0530 01/22/18 1144 01/23/18 0500  Weight: 93.6 kg (206 lb 5.6 oz) 94.3 kg (208 lb) 91.3 kg (201 lb 3.2 oz)    Exam:  . General: 77 y.o. year-old male well-developed well-nourished no acute distress.  Alert and oriented x3.   . Cardiovascular: Regular rate and rhythm with no rubs or gallops.  No thyromegaly or JVD noted. [name] Respiratory: Clear to auscultation with no wheezes or rales. Good inspiratory effort. . Abdomen: Soft nontender nondistended with normal bowel sounds x4 quadrants. . Musculoskeletal: No lower extremity edema. 2/4 pulses in all 4 extremities. . Skin: No ulcerative lesions noted or rashes . Psychiatry: Mood is appropriate for condition and setting   Data Reviewed: CBC: Recent Labs  Lab 01/18/18 0832 01/20/18 0904 01/20/18 1056 01/21/18 0517 01/22/18 0542 01/23/18 0830  WBC 4.1 4.7 4.1  --  5.4 6.5  NEUTROABS  --  2,975 2.5  --   --   --   HGB 8.2* 7.9* 7.8* 9.6* 10.0* 9.3*  HCT 24.5* 25.0* 24.3* 30.4* 31.4* 29.0*  MCV 83.8 87.7 85.3  --  87.5 86.6  PLT 34.0 Repeated and verified X2.* 52* 48*  --  64* 81*   Basic Metabolic Panel: Recent Labs  Lab 01/20/18 0904 01/20/18 1056 01/21/18 0517 01/22/18 0542 01/23/18 0830  NA 139 139 144 146* 139  K 5.3* 5.1 4.8 4.9 4.7  CL 107 107 107 111 106  CO2 26 24 29 23 23   GLUCOSE 122* 102* 126* 95 213*  BUN 27* 28* 27* 24* 34*  CREATININE 1.79* 1.87* 1.59* 1.73* 1.96*  CALCIUM 8.9 8.2* 9.4 9.0 8.3*   GFR: Estimated Creatinine Clearance: 37.3 mL/min (A) (by C-G formula based on SCr of 1.96 mg/dL (H)). Liver Function Tests: Recent Labs  Lab 01/18/18 0832 01/20/18 1056 01/22/18 0542 01/23/18 0830  AST 22 35 46* 79*  ALT 18 27 37 63*  ALKPHOS 98 157* 207* 327*  BILITOT  0.6 0.8 0.9 1.6*  PROT 5.6* 5.9* 6.2* 6.3*  ALBUMIN 3.6 3.3* 3.4* 3.3*   Recent Labs  Lab 01/20/18 1056  LIPASE 46   No results for input(s): AMMONIA in the last 168 hours. Coagulation Profile: No results for input(s): INR, PROTIME in the last 168 hours. Cardiac Enzymes: No results for input(s): CKTOTAL, CKMB, CKMBINDEX, TROPONINI in the last 168 hours. BNP (last 3 results) No results for input(s): PROBNP in the last 8760 hours. HbA1C: No results for input(s): HGBA1C in the last 72 hours. CBG: Recent Labs  Lab 01/22/18 1402 01/22/18 1632 01/22/18 2116 01/23/18 0753 01/23/18 1138  GLUCAP 105* 169* 167* 146* 147*   Lipid Profile: No results for input(s): CHOL, HDL, LDLCALC, TRIG, CHOLHDL, LDLDIRECT in the last 72 hours. Thyroid Function Tests: No results for input(s): TSH, T4TOTAL, FREET4, T3FREE, THYROIDAB in the last 72 hours. Anemia Panel: Recent Labs    01/22/18 1427 01/23/18 0830  VITAMINB12 834 726  FERRITIN 109 185  TIBC  --  317  IRON  --  28*  RETICCTPCT  --  1.5  Urine analysis:    Component Value Date/Time   COLORURINE YELLOW 01/20/2018 1253   APPEARANCEUR CLEAR 01/20/2018 1253   LABSPEC 1.015 01/20/2018 1253   PHURINE 5.5 01/20/2018 1253   GLUCOSEU NEGATIVE 01/20/2018 1253   GLUCOSEU 100 (A) 10/02/2016 1045   HGBUR NEGATIVE 01/20/2018 1253   BILIRUBINUR NEGATIVE 01/20/2018 1253   BILIRUBINUR negative 07/06/2014 1704   KETONESUR NEGATIVE 01/20/2018 1253   PROTEINUR NEGATIVE 01/20/2018 1253   UROBILINOGEN 0.2 10/02/2016 1045   NITRITE NEGATIVE 01/20/2018 1253   LEUKOCYTESUR NEGATIVE 01/20/2018 1253   Sepsis Labs: @LABRCNTIP (procalcitonin:4,lacticidven:4)  )No results found for this or any previous visit (from the past 240 hour(s)).    Studies: US Abdomen Complete  Result Date: 01/23/2018 CLINICAL DATA:  Transaminitis. Previous aortic aneurysm repair. Diabetes, hypertension EXAM: COMPLETE ABDOMINAL ULTRASOUND COMPARISON:  CT 03/20/2017 and  previous FINDINGS: Gallbladder: Innumerable small shadowing calculi layer in the dependent aspect of the nondilated gallbladder. No wall thickening, measured up to 1.6 mm thickness. No pericholecystic fluid. Sonographer reports no sonographic Murphy's sign. Common bile duct:  Normal in caliber, 3.55mm diameter. Liver: Homogeneous in echotexture without focal lesion or intrahepatic bile duct dilatation. Antegrade portal venous flow noted on color Doppler. IVC:  Negative Pancreas:  Negative Spleen:  No focal lesion, craniocaudal 12cm in length. Right Kidney:  No mass or hydronephrosis, 10.7cm in length. Left Kidney:  No lesion or hydronephrosis, 10.3cm in length. Abdominal aorta:  Up to 3 cm diameter in its proximal segment. IMPRESSION: 1. Cholelithiasis without other ultrasound evidence of cholecystitis or biliary obstruction. Electronically Signed   By: Corlis Leak M.D.   On: 01/23/2018 13:41    Scheduled Meds: . sodium chloride   Intravenous Once  . ALPRAZolam  0.5 mg Oral QHS  . amiodarone  200 mg Oral Daily  . atorvastatin  40 mg Oral Daily  . carvedilol  3.125 mg Oral BID WC  . insulin aspart  0-15 Units Subcutaneous TID WC  . insulin glargine  45 Units Subcutaneous QHS  . levothyroxine  125 mcg Oral QAC breakfast  . pregabalin  50 mg Oral BID  . tamsulosin  0.4 mg Oral QHS  . tiotropium  18 mcg Inhalation Daily    Continuous Infusions:    LOS: 3 days     Darlin Drop, MD [facility] Pager [phone]  If 7PM-7AM, please contact night-coverage [email or web address] Password [facility] 01/23/2018, 2:36 PM

## 2018-01-23 NOTE — Progress Notes (Addendum)
Pt declined cpap again tonight stating that he cant get used to our machine.  This RT offered to make adjustments to the settings, but pt declined.  Pt was advised that RT is available all night and encouraged him to call, should he change his mind.

## 2018-01-24 DIAGNOSIS — N189 Chronic kidney disease, unspecified: Secondary | ICD-10-CM

## 2018-01-24 DIAGNOSIS — E039 Hypothyroidism, unspecified: Secondary | ICD-10-CM

## 2018-01-24 DIAGNOSIS — F418 Other specified anxiety disorders: Secondary | ICD-10-CM

## 2018-01-24 DIAGNOSIS — I5022 Chronic systolic (congestive) heart failure: Secondary | ICD-10-CM

## 2018-01-24 DIAGNOSIS — D696 Thrombocytopenia, unspecified: Secondary | ICD-10-CM

## 2018-01-24 DIAGNOSIS — I129 Hypertensive chronic kidney disease with stage 1 through stage 4 chronic kidney disease, or unspecified chronic kidney disease: Secondary | ICD-10-CM

## 2018-01-24 DIAGNOSIS — M069 Rheumatoid arthritis, unspecified: Secondary | ICD-10-CM

## 2018-01-24 DIAGNOSIS — J449 Chronic obstructive pulmonary disease, unspecified: Secondary | ICD-10-CM

## 2018-01-24 DIAGNOSIS — Z87891 Personal history of nicotine dependence: Secondary | ICD-10-CM

## 2018-01-24 DIAGNOSIS — I509 Heart failure, unspecified: Secondary | ICD-10-CM

## 2018-01-24 DIAGNOSIS — I4891 Unspecified atrial fibrillation: Secondary | ICD-10-CM

## 2018-01-24 DIAGNOSIS — D649 Anemia, unspecified: Secondary | ICD-10-CM

## 2018-01-24 DIAGNOSIS — E119 Type 2 diabetes mellitus without complications: Secondary | ICD-10-CM

## 2018-01-24 DIAGNOSIS — I255 Ischemic cardiomyopathy: Secondary | ICD-10-CM

## 2018-01-24 LAB — COMPREHENSIVE METABOLIC PANEL
ALBUMIN: 3.1 g/dL — AB (ref 3.5–5.0)
ALT: 57 U/L — ABNORMAL HIGH (ref 0–44)
ANION GAP: 7 (ref 5–15)
AST: 49 U/L — AB (ref 15–41)
Alkaline Phosphatase: 318 U/L — ABNORMAL HIGH (ref 38–126)
BILIRUBIN TOTAL: 1.2 mg/dL (ref 0.3–1.2)
BUN: 36 mg/dL — ABNORMAL HIGH (ref 8–23)
CO2: 25 mmol/L (ref 22–32)
CREATININE: 1.85 mg/dL — AB (ref 0.61–1.24)
Calcium: 8.7 mg/dL — ABNORMAL LOW (ref 8.9–10.3)
Chloride: 110 mmol/L (ref 98–111)
GFR calc Af Amer: 39 mL/min — ABNORMAL LOW (ref >60)
GFR calc non Af Amer: 34 mL/min — ABNORMAL LOW (ref >60)
GLUCOSE: 143 mg/dL — AB (ref 70–99)
POTASSIUM: 4.4 mmol/L (ref 3.5–5.1)
SODIUM: 142 mmol/L (ref 135–145)
TOTAL PROTEIN: 6 g/dL — AB (ref 6.5–8.1)

## 2018-01-24 LAB — GLUCOSE, CAPILLARY: GLUCOSE-CAPILLARY: 146 mg/dL — AB (ref 70–99)

## 2018-01-24 LAB — CBC
HEMATOCRIT: 29.8 % — AB (ref 39.0–52.0)
HEMOGLOBIN: 9.5 g/dL — AB (ref 13.0–17.0)
MCH: 27.5 pg (ref 26.0–34.0)
MCHC: 31.9 g/dL (ref 30.0–36.0)
MCV: 86.1 fL (ref 78.0–100.0)
Platelets: 80 10*3/uL — ABNORMAL LOW (ref 150–400)
RBC: 3.46 MIL/uL — ABNORMAL LOW (ref 4.22–5.81)
RDW: 15.8 % — AB (ref 11.5–15.5)
WBC: 5.9 10*3/uL (ref 4.0–10.5)

## 2018-01-24 LAB — HEPATITIS PANEL, ACUTE
HCV Ab: <0.1 s/co ratio (ref 0.0–0.9)
HEP A IGM: NEGATIVE
HEP B C IGM: NEGATIVE
HEP B S AG: NEGATIVE

## 2018-01-24 LAB — HAPTOGLOBIN: Haptoglobin: 248 mg/dL — ABNORMAL HIGH (ref 34–200)

## 2018-01-24 MED ORDER — AMIODARONE HCL 200 MG PO TABS: 200.0000 mg | ORAL_TABLET | Freq: Every day | ORAL | 0 refills | Status: DC

## 2018-01-24 MED ORDER — FERROUS SULFATE 325 (65 FE) MG PO TABS: 325.0000 mg | ORAL_TABLET | Freq: Every day | ORAL | 0 refills | Status: DC

## 2018-01-24 MED ORDER — AMIODARONE HCL 100 MG PO TABS: 100.0000 mg | ORAL_TABLET | Freq: Every day | ORAL | 0 refills | Status: DC

## 2018-01-24 MED ORDER — AMIODARONE HCL 100 MG PO TABS: 100.0000 mg | ORAL_TABLET | Freq: Every day | ORAL | Status: DC

## 2018-01-24 MED ORDER — CARVEDILOL 3.125 MG PO TABS: 3.1250 mg | ORAL_TABLET | Freq: Every day | ORAL | 0 refills | Status: DC

## 2018-01-24 MED ORDER — APIXABAN 5 MG PO TABS: 5.0000 mg | ORAL_TABLET | Freq: Two times a day (BID) | ORAL | Status: DC

## 2018-01-24 NOTE — Consult Note (Signed)
[facility] CONSULT NOTE  Patient Care Team: Shelda Pal, DO as PCP - General (Family Medicine) Clent Jacks, MD as Consulting Physician (Ophthalmology) Brand Males, MD as Consulting Physician (Pulmonary Disease) System, Provider Not In as Consulting Physician Lahoma Rocker, MD as Consulting Physician (Rheumatology) Rigoberto Noel, MD as Consulting Physician (Pulmonary Disease) Lelon Perla, MD as Consulting Physician (Cardiology)  CHIEF COMPLAINTS/PURPOSE OF CONSULTATION:  Consult for anemia and thrombocytopenia  HISTORY OF PRESENTING ILLNESS:  [name] 77 y.o. male is here because of multiple health issues including COPD, A.Fib, CHF, DM, is adm with dizziness and weakness. He has had long standing issues with anemia and thrombocytopenia. His platelet counts have been 80-90 since July 2018. His bg have ranged from 7-10. Normocytic and normal Iron, B 12 and folic acid levels. He has CKD from all of his health problems. During this hospitalization, his platelets were as low as 34 but recovered to his baseline of 80.  He had upper endoscopy and colonoscopy.  I do not have the results in the computer but apparently he had a small bleeding vessel which was cauterized.  I reviewed her records extensively and collaborated the history with the patient.  MEDICAL HISTORY:  Past Medical History:  Diagnosis Date  . Anxiety   . Aortic aneurysm ([place]) 09/02/2017  . BENIGN PROSTATIC HYPERTROPHY 11/23/2009  . Cardiomyopathy ([place]) 08/28/2016  . Chronic systolic CHF (congestive heart failure) ([place]) 09/02/2017  . COPD (chronic obstructive pulmonary disease) ([place])   . CORONARY ARTERY DISEASE 11/23/2009  . 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 DEGENERATIVE JOINT DISEASE 11/23/2009  . DEPRESSION 11/23/2009  . FATIGUE 11/23/2009  . GAIT DISTURBANCE 12/10/2009  . HEMOPTYSIS UNSPECIFIED 05/07/2010  . High cholesterol   . HYPERTENSION 07/30/2009  . HYPOTHYROIDISM  07/30/2009  . Ischemic cardiomyopathy 09/02/2017  . 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 On home oxygen therapy    "2-3L; 24/7" (09/10/2016)  . OSA on CPAP   . PTSD (post-traumatic stress disorder) 03/10/2012  . 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 RA (rheumatoid arthritis) ([place]) 06/11/2011   "qwhere" (09/10/2016)  . RESPIRATORY FAILURE, CHRONIC 07/31/2009  . Scleritis of both eyes 03/17/2014  . 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 Type II diabetes mellitus ([place])     SURGICAL HISTORY: Past Surgical History:  Procedure Laterality Date  . 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 ABDOMINAL EXPLORATION SURGERY  02/2004   w/LOA/notes 12/10/2010; small bowel obstruction repair with adhesiolysis   . BACK SURGERY    . CARDIAC CATHETERIZATION     2 heart caths in the past.  One in 2000s showed one ulcerated plaque  Rx medically; [facility] 09/05/2016  . CATARACT EXTRACTION W/ INTRAOCULAR LENS  IMPLANT, BILATERAL Bilateral 2000s  . COLECTOMY     hx of remote ileum resection due to bleeding  . COLONOSCOPY WITH PROPOFOL N/A 01/22/2018   Procedure: COLONOSCOPY WITH PROPOFOL;  Surgeon: Gatha Mayer, MD;  Location: [facility];  Service: Endoscopy;  Laterality: N/A;  . CORONARY ANGIOPLASTY WITH STENT PLACEMENT  09/10/2016  . CORONARY STENT INTERVENTION N/A 09/10/2016   Procedure: Coronary Stent Intervention;  Surgeon: Burnell Blanks, MD;  Location: [facility];  Service: Cardiovascular;  Laterality: N/A;  Distal RCA 4.0x16 Synergy  . ESOPHAGOGASTRODUODENOSCOPY (EGD) WITH PROPOFOL N/A 01/22/2018   Procedure: ESOPHAGOGASTRODUODENOSCOPY (EGD) WITH PROPOFOL;  Surgeon: Gatha Mayer, MD;  Location: [facility];  Service: Endoscopy;  Laterality: N/A;  . FEMORAL  EMBOLOECTOMY Left 07/2000   with left leg ischemia; Dr. Kellie Simmering, vascular  . GANGLION CYST EXCISION Right    "wrist"; Dr. Daylene Katayama  . HOT HEMOSTASIS N/A 01/22/2018   Procedure: HOT HEMOSTASIS (ARGON PLASMA  COAGULATION/BICAP);  Surgeon: Gatha Mayer, MD;  Location: [facility];  Service: Endoscopy;  Laterality: N/A;  . LUMBAR LAMINECTOMY  1972   Dr. Collie Siad  . RIGHT/LEFT HEART CATH AND CORONARY ANGIOGRAPHY N/A 09/10/2016   Procedure: Right/Left Heart Cath and Coronary Angiography;  Surgeon: Burnell Blanks, MD;  Location: [facility];  Service: Cardiovascular;  Laterality: N/A;  . TONSILLECTOMY      SOCIAL HISTORY: Social History   Socioeconomic History  . Marital status: Married    Spouse name: Not on file  . Number of children: Not on file  . Years of education: Not on file  . Highest education level: Not on file  Occupational History  . 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 Financial resource strain: Not on file  . Food insecurity:    Worry: Not on file    Inability: Not on file  . Transportation needs:    Medical: Not on file    Non-medical: Not on file  Tobacco Use  . Smoking status: Former Smoker    Packs/day: 2.50    Years: 40.00    Pack years: 100.00    Types: Cigarettes, Pipe, Cigars    Last attempt to quit: 07/28/1998    Years since quitting: 19.5  . Smokeless tobacco: Never Used  Substance and Sexual Activity  . Alcohol use: No    Alcohol/week: 0.0 oz  . Drug use: No  . Sexual activity: Not on file  Lifestyle  . Physical activity:    Days per week: Not on file    Minutes per session: Not on file  . Stress: Not on file  Relationships  . Social connections:    Talks on phone: Not on file    Gets together: Not on file    Attends religious service: Not on file    Active member of club or organization: Not on file    Attends meetings of clubs or organizations: Not on file    Relationship status: Not on file  . Intimate partner violence:    Fear of current or ex partner: Not on file    Emotionally abused: Not on file    Physically abused: Not on file    Forced sexual activity: Not on file  Other  Topics Concern  . Not on file  Social History Narrative   Lives in the home with his wife   Sees [place] every 6 month for meds.   Denies asbestos exposure    FAMILY HISTORY: Family History  Problem Relation Age of Onset  . Other Mother        gun shot    ALLERGIES:  is allergic to no known allergies.  MEDICATIONS:  Current Facility-Administered Medications  Medication Dose Route Frequency Provider Last Rate Last Dose  . 0.9 %  sodium chloride infusion (Manually program via Guardrails IV Fluids)   Intravenous Once Gatha Mayer, MD      . acetaminophen (TYLENOL) tablet 650 mg  650 mg Oral Q6H PRN Gatha Mayer, MD      . albuterol (PROVENTIL) (2.5 MG/3ML) 0.083% nebulizer solution 2.5 mg  2.5 mg Nebulization Q6H PRN Gatha Mayer, MD      . ALPRAZolam (  XANAX) tablet 0.5 mg  0.5 mg Oral QHS Gatha Mayer, MD   0.5 mg at 01/23/18 2154  . amiodarone (PACERONE) tablet 200 mg  200 mg Oral Daily Gatha Mayer, MD   200 mg at 01/24/18 0935  . atorvastatin (LIPITOR) tablet 40 mg  40 mg Oral Daily Gatha Mayer, MD   40 mg at 01/24/18 0935  . carvedilol (COREG) tablet 3.125 mg  3.125 mg Oral Q breakfast Irene Pap N, DO   3.125 mg at 01/24/18 3007  . ferrous sulfate tablet 325 mg  325 mg Oral Q breakfast Kayleen Memos, DO   325 mg at 01/24/18 6226  . insulin aspart (novoLOG) injection 0-15 Units  0-15 Units Subcutaneous TID WC Gatha Mayer, MD   2 Units at 01/24/18 [phone]  . insulin glargine (LANTUS) injection 45 Units  45 Units Subcutaneous QHS Gatha Mayer, MD   45 Units at 01/23/18 2046  . levothyroxine (SYNTHROID, LEVOTHROID) tablet 125 mcg  125 mcg Oral QAC breakfast Gatha Mayer, MD   125 mcg at 01/24/18 [phone]  . ondansetron (ZOFRAN) tablet 4 mg  4 mg Oral Q6H PRN Gatha Mayer, MD       Or  . ondansetron [facility]) injection 4 mg  4 mg Intravenous Q6H PRN Gatha Mayer, MD      . pregabalin (LYRICA) capsule 50 mg  50 mg Oral BID Gatha Mayer, MD   50 mg at  01/24/18 0935  . tamsulosin (FLOMAX) capsule 0.4 mg  0.4 mg Oral QHS Gatha Mayer, MD   0.4 mg at 01/23/18 2045  . tiotropium (SPIRIVA) inhalation capsule 18 mcg  18 mcg Inhalation Daily Gatha Mayer, MD   18 mcg at 01/24/18 5638    REVIEW OF SYSTEMS:   Constitutional: Denies fevers, chills or abnormal night sweats Eyes: Denies blurriness of vision, double vision or watery eyes Ears, nose, mouth, throat, and face: Denies mucositis or sore throat Respiratory: O2 by [place] Cardiovascular: A fib Gastrointestinal:  Denies nausea, heartburn or change in bowel habits Skin: Denies abnormal skin rashes Lymphatics: Denies new lymphadenopathy or easy bruising Neurological: Mild peripheral neuropathy Behavioral/Psych: Mood is stable, no new changes  All other systems were reviewed with the patient and are negative.  PHYSICAL EXAMINATION: ECOG PERFORMANCE STATUS: 2 - Symptomatic, <50% confined to bed  Vitals:   01/24/18 0521 01/24/18 0927  BP: 92/61   Pulse: 74   Resp: 17   Temp: 98.2 F (36.8 C)   SpO2: 98% 90%   Filed Weights   01/22/18 1144 01/23/18 0500 01/24/18 0521  Weight: 208 lb (94.3 kg) 201 lb 3.2 oz (91.3 kg) 205 lb 11 oz (93.3 kg)    GENERAL:alert, no distress and comfortable SKIN: skin color, texture, turgor are normal, no rashes or significant lesions EYES: normal, conjunctiva are pink and non-injected, sclera clear OROPHARYNX:no exudate, no erythema and lips, buccal mucosa, and tongue normal  NECK: supple, thyroid normal size, non-tender, without nodularity LYMPH:  no palpable lymphadenopathy in the cervical, axillary or inguinal LUNGS: Diminished breath sounds at the bases O2 by nasal cannula HEART: Atrial fibrillation ABDOMEN:abdomen soft, non-tender and normal bowel sounds Musculoskeletal:no cyanosis of digits and no clubbing  PSYCH: alert & oriented x 3 with fluent speech NEURO: no focal motor/sensory deficits  LABORATORY DATA:  I have reviewed the data as  listed Lab Results  Component Value Date   WBC 5.9 01/24/2018   HGB 9.5 (L) 01/24/2018  HCT 29.8 (L) 01/24/2018   MCV 86.1 01/24/2018   PLT 80 (L) 01/24/2018   Lab Results  Component Value Date   NA 142 01/24/2018   K 4.4 01/24/2018   CL 110 01/24/2018   CO2 25 01/24/2018    RADIOGRAPHIC STUDIES: I have personally reviewed the radiological reports and agreed with the findings in the report.  ASSESSMENT AND PLAN:  1.  Thrombocytopenia: Platelet count today is at 80.  This is been his baseline over the past 1 year.  He had one episode of low platelets of 34. I reviewed the peripheral smear and there is no evidence of significant increase in schistocytes.  There was occasional 1-2 schistocytes per high-power field.  There is no clinical suspicion for TTP.  His LDH is also normal. Differential diagnosis myelodysplasia versus low-grade ITP I recommended a follow-up in my outpatient office in 2 to 4 weeks  2. chronic normocytic anemia: I suspect this is related to chronic kidney disease.  B74 folic acid iron studies Were all normal. In order to rule out myelodysplasia, we may have to do bone marrow biopsy. However because of his extensive comorbidities, I do not recommend at this time. We can discuss this when he comes back to see [place] in our office.  Thank you for the consultation  All questions were answered. The patient knows to call the clinic with any problems, questions or concerns.    Harriette Ohara, MD _0 @

## 2018-01-24 NOTE — Progress Notes (Signed)
Patient ambulated in hallway on 2 L oxygen.  Oxygen saturations were 92%.

## 2018-01-24 NOTE — Care Management Note (Signed)
Case Management Note  Patient Details  Name: [name] MRN: [id number] Date of Birth: 09/01/1940  Subjective/Objective: PAF, symptomatic anemia, Thrombocytopenia                   Action/Plan: NCM spoke to pt and states his dtr has Homecare agency, [facility]. He declines HH at this time. States he has RW x2, canes and shower chair.   Expected Discharge Date:  01/24/18               Expected Discharge Plan:  Home w Home Health Services  In-House Referral:  NA  Discharge planning Services  CM Consult  Post Acute Care Choice:  Home Health Choice offered to:  Patient  DME Arranged:  N/A DME Agency:  NA  HH Arranged:  Patient Refused HH HH Agency:  NA  Status of Service:  Completed, signed off  If discussed at Long Length of Stay Meetings, dates discussed:    Additional Comments:  Elliot Cousin, RN 01/24/2018, 1:45 PM

## 2018-01-24 NOTE — Discharge Instructions (Signed)
Gastrointestinal Bleeding °Gastrointestinal bleeding is bleeding somewhere along the path food travels through the body (digestive tract). This path is anywhere between the mouth and the opening of the butt (anus). You may have blood in your poop (stools) or have black poop. If you throw up (vomit), there may be blood in it. °This condition can be mild, serious, or even life-threatening. If you have a lot of bleeding, you may need to stay in the hospital. °Follow these instructions at home: °· Take over-the-counter and prescription medicines only as told by your doctor. °· Eat foods that have a lot of fiber in them. These foods include whole grains, fruits, and vegetables. You can also try eating 1-3 prunes each day. °· Drink enough fluid to keep your pee (urine) clear or pale yellow. °· Keep all follow-up visits as told by your doctor. This is important. °Contact a doctor if: °· Your symptoms do not get better. °Get help right away if: °· Your bleeding gets worse. °· You feel dizzy or you pass out (faint). °· You feel weak. °· You have very bad cramps in your back or belly (abdomen). °· You pass large clumps of blood (clots) in your poop. °· Your symptoms are getting worse. °This information is not intended to replace advice given to you by your health care provider. Make sure you discuss any questions you have with your health care provider. °Document Released: 04/22/2008 Document Revised: 12/20/2015 Document Reviewed: 01/01/2015 °Elsevier Interactive Patient Education © 2018 [employer] ° °

## 2018-01-24 NOTE — Discharge Summary (Signed)
Discharge Summary  [name] JSH:[id number] DOB: 04/26/1941  PCP: Dakota Dory, DO  Admit date: 01/20/2018 Discharge date: 01/24/2018  Time spent: 25 minutes  Recommendations for Outpatient Follow-up:  1. Follow-up with heme oncology 2. Follow-up with GI 3. Follow with cardiology 4. Follow-up with your PCP 5. Take your medications as prescribed 6. Review your cardiac medications with your cardiologist on 01/25/2018 7. Keep your appointments tomorrow 01/25/2018 with your medical providers.  Discharge Diagnoses:  Active Hospital Problems   Diagnosis Date Noted  . Thrombocytopenia (HCC) 01/22/2018  . Symptomatic anemia 01/20/2018  . PAF (paroxysmal atrial fibrillation) [facility])     Resolved Hospital Problems  No resolved problems to display.    Discharge Condition: Stable  Diet recommendation: Resume previous diet  Vitals:   01/24/18 0521 01/24/18 0927  BP: 92/61   Pulse: 74   Resp: 17   Temp: 98.2 F (36.8 C)   SpO2: 98% 90%    History of present illness:  [name] is a 77 year old male with past medical history significant for COPD, chronic respiratory failure on 3 L of oxygen by nasal cannula continuously, pulmonary fibrosis, paroxysmal A. fib on Eliquis, coronary artery disease status post PCI with stent, combined systolic and diastolic CHF, type II diabetes, hypothyroidism who was admitted on 01/20/2018 due to symptomatic anemia.  Received 2 units of PRBCs.  GI consulted.  Had EGD and colonoscopy on 01/22/2018.  EGD unremarkable.  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  Hospital course complicated by AKI.  Losartan held.  Persistent thrombocytopenia.  Heme oncology consulted.  Differential includes myelodysplasia versus low-grade ITP.  Will follow up outpatient in 2 weeks.  01/24/2018: Patient seen and examined at his bedside.  He has no new complaints.  On the day of discharge patient was hemodynamically  stable.  He will need to follow-up with his PCP, heme oncology [name], GI, cardiology, post hospitalization.     Patient strongly advised to follow-up with his cardiologist tomorrow on 01/25/2018 and review his cardiac medications to avoid hypotension.   Hospital Course:  Active Problems:   PAF (paroxysmal atrial fibrillation) (HCC)   Symptomatic anemia   Thrombocytopenia (HCC)  Dizziness suspect secondary to symptomatic anemia vs others MRI of brain unremarkable for any acute intracranial findings PT to assess prior to discharge Fall precautions Orthosthatic vital signs unremarkable Maintain MAP 65 or above Continue to hold losartan due to AKI Continue PT at home to improve balance  Cholelithiasis with no sign of acute cholecystitis Found on abdominal U/S done today 01/23/18 Denies abdominal pain or nausea  Hypotension Continue to hold losartan Change Coreg to once a day instead of twice daily Change amiodarone to once a day instead of twice daily Reduced dose of amiodarone to 100 mg daily from 200 mg daily Review your cardiac medications with your cardiologist tomorrow 01/25/2018  Suspected acute blood loss anemia post 2 units of PRBC transfusion Post EGD on 01/22/2018 unremarkable Colonoscopy done on 01/22/2018 revealed single colonic NG tube dysplastic lesion, clips placed. Hemoglobin 9.3 from 10.0 yesterday No sign of overt bleeding Anemia work-up revealed iron deficiency anemia Continue ferrous sulfate p.o. 325 daily Normal LDH-no sign of intravascular hemolysis  Chronic thrombocytopenia with unclear etiology Platelet 80 on 01/24/2018 from 81 from 64k Peripheral smear ordered revealed some schistocytes Heme oncology [name] consulted Differential includes myelodysplasia versus ITP Follow-up with heme-oncology in 2 weeks  Acute transaminitis, improving Alkaline phosphatase, AST, ALT elevated Abdominal ultrasound revealed gallstones  with no sign of acute  cholecystitis. Viral hepatitis panel negative  Hyperbilirubinemia, resolved  Acute on chronic hypoxic respiratory failure Self-reported at baseline on 2 L of oxygen at rest and on 3 L upon ambulation Currently on 3 L by nasal cannula  AKI on CKD 3, improving Creatinine 1.85 from 1.96 after holding losartan Creatinine on presentation 1.87 Continue to hold losartan Defer to cardiology to restart from 3.1  Paroxysmal A. Fib Rate controlled On Eliquis at home Resume Eliquis on Sunday, 01/24/2018 if platelets above 50  Combined systolic and diastolic chronic CHF 2D echo done on 09/02/2017 revealed LVEF 40 to 45% with grade 2 diastolic dysfunction Strict I's and O's Daily weight  COPD Continue COPD medications  Type 2 diabetes Last hemoglobin A1c 9.3 on 09/03/2017 Continue Lantus Continue insulin sliding scale  Hypothyroidism Continue Synthroid    Procedures: EGD and colonoscopy on 01/22/2018  Consultations:  GI  Heme oncology  Discharge Exam: BP 92/61 (BP Location: Left Arm)   Pulse 74   Temp 98.2 F (36.8 C) (Oral)   Resp 17   Ht 6\' 2"  (1.88 m)   Wt 93.3 kg (205 lb 11 oz)   SpO2 90%   BMI 26.41 kg/m  . General: 77 y.o. year-old male well developed well nourished in no acute distress.  Alert and oriented x3. . Cardiovascular: Regular rate and rhythm with no rubs or gallops.  No thyromegaly or JVD noted.   [name] Respiratory: Clear to auscultation with no wheezes or rales. Good inspiratory effort. . Abdomen: Soft nontender nondistended with normal bowel sounds x4 quadrants. . Musculoskeletal: No lower extremity edema. 2/4 pulses in all 4 extremities. . Skin: No ulcerative lesions noted or rashes, . Psychiatry: Mood is appropriate for condition and setting  Discharge Instructions You were cared for by a hospitalist during your hospital stay. If you have any questions about your discharge medications or the care you received while you were in the hospital after  you are discharged, you can call the unit and asked to speak with the hospitalist on call if the hospitalist that took care of you is not available. Once you are discharged, your primary care physician will handle any further medical issues. Please note that NO REFILLS for any discharge medications will be authorized once you are discharged, as it is imperative that you return to your primary care physician (or establish a relationship with a primary care physician if you do not have one) for your aftercare needs so that they can reassess your need for medications and monitor your lab values.   Allergies as of 01/24/2018      Reactions   No Known Allergies       Medication List    STOP taking these medications   acetaminophen 325 MG tablet Commonly known as:  TYLENOL   losartan 100 MG tablet Commonly known as:  COZAAR   losartan 50 MG tablet Commonly known as:  COZAAR   meclizine 25 MG tablet Commonly known as:  ANTIVERT   metFORMIN 750 MG 24 hr tablet Commonly known as:  GLUCOPHAGE-XR   OZEMPIC 0.25 or 0.5 MG/DOSE Sopn Generic drug:  Semaglutide     TAKE these medications   albuterol (2.5 MG/3ML) 0.083% nebulizer solution Commonly known as:  PROVENTIL 1 vial in nebulizer every 6 hours as needed Dx 496 What changed:    how much to take  how to take this  when to take this  reasons to take this  additional instructions  ALPRAZolam 0.5 MG tablet Commonly known as:  XANAX Take 0.5 mg by mouth at bedtime.   amiodarone 100 MG tablet Commonly known as:  PACERONE Take 1 tablet (100 mg total) by mouth daily. Start taking on:  01/25/2018 What changed:    medication strength  See the new instructions.   apixaban 5 MG Tabs tablet Commonly known as:  ELIQUIS Take 1 tablet (5 mg total) by mouth 2 (two) times daily.   aspirin EC 81 MG tablet Take 1 tablet (81 mg total) by mouth daily.   atorvastatin 40 MG tablet Commonly known as:  LIPITOR Take 1 tablet (40 mg  total) by mouth daily.   carvedilol 3.125 MG tablet Commonly known as:  COREG Take 1 tablet (3.125 mg total) by mouth daily. What changed:  when to take this   ferrous sulfate 325 (65 FE) MG tablet Take 1 tablet (325 mg total) by mouth daily with breakfast. Start taking on:  01/25/2018   FLOMAX 0.4 MG Caps capsule Generic drug:  tamsulosin Take 1 capsule (0.4 mg total) by mouth at bedtime.   insulin glargine 100 UNIT/ML injection Commonly known as:  LANTUS Inject 0.4 mLs (40 Units total) into the skin at bedtime. What changed:  how much to take   levothyroxine 125 MCG tablet Commonly known as:  SYNTHROID, LEVOTHROID Take 1.5 tablets (188 mcg total) by mouth daily before breakfast. What changed:  how much to take   LUBRICATING EYE DROPS OP Apply 1 drop to eye daily as needed (dry eyes).   MEGA MULTIVITAMIN FOR MEN PO Take 1 tablet by mouth daily.   OXYGEN Inhale 2 L into the lungs continuous.   pregabalin 50 MG capsule Commonly known as:  LYRICA Take 50 mg by mouth 2 (two) times daily.   tiotropium 18 MCG inhalation capsule Commonly known as:  SPIRIVA HANDIHALER Place 1 capsule (18 mcg total) into inhaler and inhale daily.   Vitamin D3 2000 units capsule Take 2,000 Units by mouth daily.      Allergies  Allergen Reactions  . No Known Allergies    Follow-up Information    Dakota Dory, DO. Call in 1 day(s).   Specialty:  Family Medicine Why:  please call for an appointment. Contact information: [address] [place] [address] [phone]        Lewayne Bunting, MD. Call in 1 day(s).   Specialty:  Cardiology Why:  Please call for an appointment. Contact information: [address] [place] [address] [phone]        Serena Croissant, MD. Call in 1 day(s).   Specialty:  Hematology and Oncology Why:  Please call for an appointment to follow-up with [name] in 2 weeks. Contact information: [address] [place] [address] [phone]        Iva Boop, MD. Call in 1 day(s).   Specialty:  Gastroenterology Why:  Please call for an appointment. Contact information: [address] [place] [address] [phone]            The results of significant diagnostics from this hospitalization (including imaging, microbiology, ancillary and laboratory) are listed below for reference.    Significant Diagnostic Studies: Ct Head Wo Contrast  Result Date: 01/20/2018 CLINICAL DATA:  Dizziness EXAM: CT HEAD WITHOUT CONTRAST TECHNIQUE: Contiguous axial images were obtained from the base of the skull through the vertex without intravenous contrast. COMPARISON:  01/30/2010 FINDINGS: Brain: Chronic atrophic and  ischemic changes are again identified. A more focal area of decreased attenuation is noted adjacent to the right lateral ventricle best seen on image number 19 of series 2 likely representing some subacute ischemia. No acute infarct, acute hemorrhage or space-occupying mass lesion is noted. Vascular: No hyperdense vessel or unexpected calcification. Skull: Normal. Negative for fracture or focal lesion. Sinuses/Orbits: No acute finding. Other: None. IMPRESSION: Chronic atrophic and ischemic changes. Some recent subacute ischemia is noted in the right parietal lobe as described. No other focal abnormality is noted. Electronically Signed   By: Alcide Clever M.D.   On: 01/20/2018 12:00   Mr Brain Wo Contrast  Result Date: 01/20/2018 CLINICAL DATA:  Dizziness. EXAM: MRI HEAD WITHOUT CONTRAST TECHNIQUE: Multiplanar, multiecho pulse sequences of the brain and surrounding structures were obtained without intravenous contrast. COMPARISON:  Head CT 01/20/2018 MR brain 01/30/2010 FINDINGS: BRAIN: There is no acute infarct, acute hemorrhage or mass effect. The midline structures are normal. There are no old infarcts. Multifocal white matter hyperintensity, most commonly due  to chronic ischemic microangiopathy. Generalized atrophy without lobar predilection. Susceptibility-sensitive sequences show no chronic microhemorrhage or superficial siderosis. VASCULAR: Major intracranial arterial and venous sinus flow voids are preserved. SKULL AND UPPER CERVICAL SPINE: The visualized skull base, calvarium, upper cervical spine and extracranial soft tissues are normal. SINUSES/ORBITS: No fluid levels or advanced mucosal thickening. No mastoid or middle ear effusion. The orbits are normal. IMPRESSION: Generalized atrophy and chronic small vessel ischemia without acute intracranial abnormality. Electronically Signed   By: Deatra Robinson M.D.   On: 01/20/2018 22:01   US Abdomen Complete  Result Date: 01/23/2018 CLINICAL DATA:  Transaminitis. Previous aortic aneurysm repair. Diabetes, hypertension EXAM: COMPLETE ABDOMINAL ULTRASOUND COMPARISON:  CT 03/20/2017 and previous FINDINGS: Gallbladder: Innumerable small shadowing calculi layer in the dependent aspect of the nondilated gallbladder. No wall thickening, measured up to 1.6 mm thickness. No pericholecystic fluid. Sonographer reports no sonographic Murphy's sign. Common bile duct:  Normal in caliber, 3.92mm diameter. Liver: Homogeneous in echotexture without focal lesion or intrahepatic bile duct dilatation. Antegrade portal venous flow noted on color Doppler. IVC:  Negative Pancreas:  Negative Spleen:  No focal lesion, craniocaudal 12cm in length. Right Kidney:  No mass or hydronephrosis, 10.7cm in length. Left Kidney:  No lesion or hydronephrosis, 10.3cm in length. Abdominal aorta:  Up to 3 cm diameter in its proximal segment. IMPRESSION: 1. Cholelithiasis without other ultrasound evidence of cholecystitis or biliary obstruction. Electronically Signed   By: Corlis Leak M.D.   On: 01/23/2018 13:41    Microbiology: No results found for this or any previous visit (from the past 240 hour(s)).   Labs: Basic Metabolic Panel: Recent Labs  Lab  01/20/18 1056 01/21/18 0517 01/22/18 0542 01/23/18 0830 01/24/18 0513  NA 139 144 146* 139 142  K 5.1 4.8 4.9 4.7 4.4  CL 107 107 111 106 110  CO2 24 29 23 23 25   GLUCOSE 102* 126* 95 213* 143*  BUN 28* 27* 24* 34* 36*  CREATININE 1.87* 1.59* 1.73* 1.96* 1.85*  CALCIUM 8.2* 9.4 9.0 8.3* 8.7*   Liver Function Tests: Recent Labs  Lab 01/18/18 0832 01/20/18 1056 01/22/18 0542 01/23/18 0830 01/24/18 0513  AST 22 35 46* 79* 49*  ALT 18 27 37 63* 57*  ALKPHOS 98 157* 207* 327* 318*  BILITOT 0.6 0.8 0.9 1.6* 1.2  PROT 5.6* 5.9* 6.2* 6.3* 6.0*  ALBUMIN 3.6 3.3* 3.4* 3.3* 3.1*   Recent Labs  Lab 01/20/18 1056  LIPASE  46   No results for input(s): AMMONIA in the last 168 hours. CBC: Recent Labs  Lab 01/20/18 0904 01/20/18 1056 01/21/18 0517 01/22/18 0542 01/23/18 0830 01/24/18 0513  WBC 4.7 4.1  --  5.4 6.5 5.9  NEUTROABS 2,975 2.5  --   --   --   --   HGB 7.9* 7.8* 9.6* 10.0* 9.3* 9.5*  HCT 25.0* 24.3* 30.4* 31.4* 29.0* 29.8*  MCV 87.7 85.3  --  87.5 86.6 86.1  PLT 52* 48*  --  64* 81* 80*   Cardiac Enzymes: No results for input(s): CKTOTAL, CKMB, CKMBINDEX, TROPONINI in the last 168 hours. BNP: BNP (last 3 results) No results for input(s): BNP in the last 8760 hours.  ProBNP (last 3 results) No results for input(s): PROBNP in the last 8760 hours.  CBG: Recent Labs  Lab 01/23/18 0753 01/23/18 1138 01/23/18 1723 01/23/18 2031 01/24/18 0739  GLUCAP 146* 147* 143* 202* 146*       Signed:  Darlin Drop, MD [facility] 01/24/2018, 12:05 PM

## 2018-01-25 ENCOUNTER — Telehealth: Payer: Self-pay

## 2018-01-25 ENCOUNTER — Encounter (HOSPITAL_BASED_OUTPATIENT_CLINIC_OR_DEPARTMENT_OTHER): Payer: Self-pay

## 2018-01-25 ENCOUNTER — Ambulatory Visit (HOSPITAL_BASED_OUTPATIENT_CLINIC_OR_DEPARTMENT_OTHER)
Admission: RE | Admit: 2018-01-25 | Discharge: 2018-01-25 | Disposition: A | Discharge status: Home / Self Care | Payer: Medicare Other | Source: Ambulatory Visit | Attending: Cardiology | Admitting: Cardiology

## 2018-01-25 DIAGNOSIS — I712 Thoracic aortic aneurysm, without rupture, unspecified: Secondary | ICD-10-CM

## 2018-01-25 DIAGNOSIS — I714 Abdominal aortic aneurysm, without rupture, unspecified: Secondary | ICD-10-CM

## 2018-01-25 LAB — FOLATE RBC
Folate, Hemolysate: 362.3 ng/mL
Folate, RBC: 1224 ng/mL (ref >498)
HEMATOCRIT: 29.6 % — AB (ref 37.5–51.0)

## 2018-01-25 MED ORDER — IOPAMIDOL (ISOVUE-370) INJECTION 76%
100.0000 mL | Freq: Once | INTRAVENOUS | Status: AC | PRN
  Administered 2018-01-25: 80 mL via INTRAVENOUS

## 2018-01-25 NOTE — Telephone Encounter (Signed)
01/25/18  Transition Care Management Follow-up Telephone Call  ADMISSION DATE: 01/20/18 DISCHARGE DATE: 01/24/18  How have you been since you were released from the hospital? Fair per patient  Do you understand why you were in the hospital? No   Do you understand the discharge instrcutions? Yes    Items Reviewed:  Medications reviewed: Will bring in medications. Doesn't feel like going over them all because he has many.   Allergies reviewed: NKDA   Dietary changes reviewed: Low Carbohydrate. Heart Healthy   Referrals reviewed: Appointments scheduled for hospital follow up.   Functional Questionnaire:  Activities of Daily Living (ADLs): Patient can perform all independently.  Any patient concerns?  Reason for hospitalization.  Confirmed importance and date/time of follow-up visits scheduled:Yes  Confirmed with patient if condition begins to worsen call PCP or go to the ER. Yes   Patient was given the office number and encouragred to call back with questions or concerns. Yes

## 2018-01-26 ENCOUNTER — Telehealth: Payer: Self-pay | Admitting: Hematology and Oncology

## 2018-01-26 ENCOUNTER — Telehealth: Payer: Self-pay | Admitting: Family Medicine

## 2018-01-26 ENCOUNTER — Other Ambulatory Visit: Payer: Self-pay

## 2018-01-26 ENCOUNTER — Encounter: Payer: Self-pay | Admitting: *Deleted

## 2018-01-26 ENCOUNTER — Telehealth: Payer: Self-pay | Admitting: *Deleted

## 2018-01-26 DIAGNOSIS — D696 Thrombocytopenia, unspecified: Secondary | ICD-10-CM

## 2018-01-26 LAB — PROTEIN ELECTROPHORESIS, SERUM
A/G Ratio: 1.2 (ref 0.7–1.7)
ALPHA-1-GLOBULIN: 0.4 g/dL (ref 0.0–0.4)
ALPHA-2-GLOBULIN: 0.9 g/dL (ref 0.4–1.0)
Albumin ELP: 3 g/dL (ref 2.9–4.4)
Beta Globulin: 0.9 g/dL (ref 0.7–1.3)
GAMMA GLOBULIN: 0.4 g/dL (ref 0.4–1.8)
Globulin, Total: 2.6 g/dL (ref 2.2–3.9)
TOTAL PROTEIN ELP: 5.6 g/dL — AB (ref 6.0–8.5)

## 2018-01-26 LAB — ERYTHROPOIETIN: Erythropoietin: 6.2 m[IU]/mL (ref 2.6–18.5)

## 2018-01-26 NOTE — Progress Notes (Signed)
Please see if pt can fu with primary care for enlarged lymph nodes; needs urology eval for bladder wall thickening. [name]

## 2018-01-26 NOTE — Telephone Encounter (Signed)
This encounter was created in error - please disregard.

## 2018-01-26 NOTE — Progress Notes (Signed)
Per Dr Pamelia Hoit in-basket message,   pt needs one month appt with him and CBC with diff.   Will send scheduling message for appt and lab appt too and they will contact pt.

## 2018-01-26 NOTE — Telephone Encounter (Signed)
Patients wife called in to inform the patient got home from the hospital on Sunday afternoon. This morning he is having severe chills/shaking (cannot hold anything in his hand), nose running and extreme weakness. I did inform she needs to call 911 (the patients wife is currently herself in hospice care).  They have called their daughter and currently she is on her way to the home. The wife did agree to call 911///against the patients judgement as he does not want to return to the hospital.

## 2018-01-26 NOTE — Telephone Encounter (Signed)
Mailed patient calendar of upcoming august appts per 7/2 sch message  °

## 2018-01-26 NOTE — Telephone Encounter (Signed)
-----   Message from Lewayne Bunting, MD sent at 01/26/2018  7:26 AM EDT -----   ----- Message ----- From: Artis Delay Sent: 01/25/2018  11:57 AM To: Lewayne Bunting, MD, #

## 2018-01-26 NOTE — Telephone Encounter (Signed)
Please see if pt can fu with primary care for enlarged lymph nodes; needs urology eval for bladder wall thickening. Olga Millers  Spoke with pt, he has an appointment with his medical doctor Friday this week. CT scans forwarded to patients PCP. Scan and last office note faxed to [facility] @ [phone] attn cindy. They will call the patient with an appointment.

## 2018-01-29 ENCOUNTER — Ambulatory Visit: Payer: Medicare Other | Admitting: Family Medicine

## 2018-01-29 ENCOUNTER — Encounter: Payer: Self-pay | Admitting: Family Medicine

## 2018-01-29 VITALS — BP 140/70 | HR 103 | Temp 98.3°F | Ht 72.0 in | Wt 207.2 lb

## 2018-01-29 DIAGNOSIS — D649 Anemia, unspecified: Secondary | ICD-10-CM

## 2018-01-29 LAB — BASIC METABOLIC PANEL
BUN: 28 mg/dL — ABNORMAL HIGH (ref 6–23)
CALCIUM: 9.1 mg/dL (ref 8.4–10.5)
CO2: 25 mEq/L (ref 19–32)
Chloride: 102 mEq/L (ref 96–112)
Creatinine, Ser: 1.52 mg/dL — ABNORMAL HIGH (ref 0.40–1.50)
GFR: 47.57 mL/min — AB (ref >60.00)
Glucose, Bld: 320 mg/dL — ABNORMAL HIGH (ref 70–99)
POTASSIUM: 5.1 meq/L (ref 3.5–5.1)
SODIUM: 135 meq/L (ref 135–145)

## 2018-01-29 LAB — CBC
HCT: 26.4 % — ABNORMAL LOW (ref 39.0–52.0)
Hemoglobin: 8.7 g/dL — ABNORMAL LOW (ref 13.0–17.0)
MCHC: 32.9 g/dL (ref 30.0–36.0)
MCV: 84 fl (ref 78.0–100.0)
Platelets: 107 10*3/uL — ABNORMAL LOW (ref 150.0–400.0)
RBC: 3.15 Mil/uL — AB (ref 4.22–5.81)
RDW: 16.3 % — AB (ref 11.5–15.5)
WBC: 5.3 10*3/uL (ref 4.0–10.5)

## 2018-01-29 MED ORDER — METFORMIN HCL ER 500 MG PO TB24: 500.0000 mg | ORAL_TABLET | Freq: Every day | ORAL | 1 refills | Status: DC

## 2018-01-29 NOTE — Progress Notes (Signed)
Chief Complaint  Patient presents with  . Hospitalization Follow-up    HPI [name] is a 77 y.o. y.o. male who presents for a transition of care visit.  Pt was discharged from [facility] on 01/20/18.  Within 48 business hours of discharge our office contacted him via telephone to coordinate his care and needs.   Pt was having fatigue and lightheadedness. Found to have a decrease in Hb. Sent to ED and admitted. Received blood and felt much better. Saw hematology and GI. GI to perform outpatient scope. He is to have a BM bx with heme. He is asking if it is necessary to see these specialists. His medication was also changed and his sugars have been elevated as a result.  He is not currently taking any blood pressure medication.  His dizziness has since resolved after receiving the transfusion.   Past Medical History:  Diagnosis Date  . Anxiety   . Aortic aneurysm (HCC) 09/02/2017  . BENIGN PROSTATIC HYPERTROPHY 11/23/2009  . Cardiomyopathy (HCC) 08/28/2016  . Chronic systolic CHF (congestive heart failure) (HCC) 09/02/2017  . COPD (chronic obstructive pulmonary disease) (HCC)   . CORONARY ARTERY DISEASE 11/23/2009  . 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 DEGENERATIVE JOINT DISEASE 11/23/2009  . DEPRESSION 11/23/2009  . FATIGUE 11/23/2009  . GAIT DISTURBANCE 12/10/2009  . HEMOPTYSIS UNSPECIFIED 05/07/2010  . High cholesterol   . HYPERTENSION 07/30/2009  . HYPOTHYROIDISM 07/30/2009  . Ischemic cardiomyopathy 09/02/2017  . 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 On home oxygen therapy    "2-3L; 24/7" (09/10/2016)  . OSA on CPAP   . PTSD (post-traumatic stress disorder) 03/10/2012  . 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 RA (rheumatoid arthritis) (HCC) 06/11/2011   "qwhere" (09/10/2016)  . RESPIRATORY FAILURE, CHRONIC 07/31/2009  . Scleritis of both eyes 03/17/2014  . 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 Type II diabetes mellitus (HCC)    Family History  Problem Relation Age of Onset  . Other Mother        gun shot     Allergies as of 01/29/2018      Reactions   No Known Allergies       Medication List        Accurate as of 01/29/18  2:53 PM. Always use your most recent med list.          albuterol (2.5 MG/3ML) 0.083% nebulizer solution Commonly known as:  PROVENTIL 1 vial in nebulizer every 6 hours as needed Dx 496   ALPRAZolam 0.5 MG tablet Commonly known as:  XANAX Take 0.5 mg by mouth at bedtime.   amiodarone 100 MG tablet Commonly known as:  PACERONE Take 1 tablet (100 mg total) by mouth daily.   apixaban 5 MG Tabs tablet Commonly known as:  ELIQUIS Take 1 tablet (5 mg total) by mouth 2 (two) times daily.   aspirin EC 81 MG tablet Take 1 tablet (81 mg total) by mouth daily.   atorvastatin 40 MG tablet Commonly known as:  LIPITOR Take 1 tablet (40 mg total) by mouth daily.   carvedilol 3.125 MG tablet Commonly known as:  COREG Take 1 tablet (3.125 mg total) by mouth daily.   ferrous sulfate 325 (65 FE) MG tablet Take 1 tablet (325 mg total) by mouth daily with breakfast.   FLOMAX 0.4 MG Caps capsule Generic drug:  tamsulosin Take 1 capsule (0.4 mg total) by mouth at bedtime.   insulin glargine 100 UNIT/ML injection Commonly known as:  LANTUS Inject 0.4 mLs (40 Units  total) into the skin at bedtime.   levothyroxine 125 MCG tablet Commonly known as:  SYNTHROID, LEVOTHROID Take 1.5 tablets (188 mcg total) by mouth daily before breakfast.   losartan 50 MG tablet Commonly known as:  COZAAR Take 1 tablet (50 mg total) by mouth daily.   LUBRICATING EYE DROPS OP Apply 1 drop to eye daily as needed (dry eyes).   MEGA MULTIVITAMIN FOR MEN PO Take 1 tablet by mouth daily.   metFORMIN 500 MG 24 hr tablet Commonly known as:  GLUCOPHAGE XR Take 1 tablet (500 mg total) by mouth daily with breakfast.   OXYGEN Inhale 2 L into the lungs continuous.   OZEMPIC 1 MG/DOSE Sopn Generic drug:  Semaglutide Inject 1 mg into the skin once a week.   pregabalin 50 MG  capsule Commonly known as:  LYRICA Take 50 mg by mouth 2 (two) times daily.   tiotropium 18 MCG inhalation capsule Commonly known as:  SPIRIVA HANDIHALER Place 1 capsule (18 mcg total) into inhaler and inhale daily.   Vitamin D3 2000 units capsule Take 2,000 Units by mouth daily.       ROS:  Constitutional: No fevers or chills, no weight loss HEENT: No headaches, hearing loss, or runny nose, no sore throat Heart: No chest pain Lungs: No SOB, no cough Abd: No bowel changes, no pain, no N/V GU: No urinary complaints Neuro: No numbness, tingling or weakness Msk: No joint or muscle pain  Objective BP 140/70 (BP Location: Left Arm, Patient Position: Sitting, Cuff Size: Normal)   Pulse (!) 103   Temp 98.3 F (36.8 C) (Oral)   Ht 6' (1.829 m)   Wt 207 lb 4 oz (94 kg)   SpO2 99%   BMI 28.11 kg/m  General Appearance:  awake, alert, oriented, in no acute distress and well developed, well nourished Skin:  there are no suspicious lesions or rashes of concern Head/face:  NCAT Eyes:  EOMI, PERRLA  Ears:  canals and TMs NmI Nose/Sinuses:  negative Mouth/Throat:  Mucosa moist, no lesions; pharynx without erythema, edema or exudate. Neck:  neck- supple, no mass, non-tender and no jvd Lungs: Clear to auscultation.  No rales, rhonchi, or wheezing. Normal effort, no accessory muscle use. Heart: Regular rate and rhythm, gallop or rub. No bruits. Abdomen:  BS+, soft, NT, ND, no masses or organomegaly Musculoskeletal:  No muscle group atrophy or asymmetry, gait normal Neurologic:  Alert and oriented x 3, gait normal., reflexes normal and symmetric, strength and  sensation grossly normal Psych exam: Nml mood and affect, age appropriate judgment and insight  Symptomatic anemia - Plan: CBC, Basic metabolic panel  Discharge summary and medication list have been reviewed/reconciled.  Labs pending at the time of discharge have been reviewed or are still pending at the time of this visit.   Follow-up labs and appointments have been ordered and/or coordinated appropriately. Educational materials regarding the patient's admitting diagnosis provided.  TRANSITIONAL CARE MANAGEMENT CERTIFICATION:  I certify the following are true:   1. Communication with the patient/care giver was made within 2 business days of discharge.  2. Complexity of Medical decision making is high.  3. Face to face visit occurred within 7 days of discharge.   Reconciled the medications for the patient. It sounds like many of his issues have resolved since receiving the blood transition.  We will follow-up in some labs today. F/u prn. The patient voiced understanding and agreement to the plan.  Jilda Roche [place], DO 01/29/18 2:53 PM

## 2018-01-29 NOTE — Patient Instructions (Addendum)
You look great today.  Keep your appointments with the gastroenterologist and the hematologist (blood specialist). We can hold off on the urology referral for a month or 2.   1-2 days to get the results of your labs back.  Let us know if you need anything.

## 2018-01-29 NOTE — Progress Notes (Signed)
Pre visit review using our clinic review tool, if applicable. No additional management support is needed unless otherwise documented below in the visit note. 

## 2018-02-08 ENCOUNTER — Telehealth: Payer: Self-pay | Admitting: Family Medicine

## 2018-02-08 DIAGNOSIS — R42 Dizziness and giddiness: Secondary | ICD-10-CM

## 2018-02-08 NOTE — Telephone Encounter (Signed)
Referral done

## 2018-02-08 NOTE — Telephone Encounter (Signed)
Spoke to the patient and is due to dizziness he is still having

## 2018-02-08 NOTE — Telephone Encounter (Signed)
Copied from CRM [id number]. Topic: Referral - Request >> Feb 08, 2018 12:06 PM [name] wrote: Reason for CRM: pt wants a referral to an ENT

## 2018-02-08 NOTE — Telephone Encounter (Signed)
Please place referral

## 2018-02-08 NOTE — Addendum Note (Signed)
Addended by: Scharlene Gloss B on: 02/08/2018 04:57 PM   Modules accepted: Orders

## 2018-02-08 NOTE — Telephone Encounter (Signed)
Why? I have no problem placing referral but we need a reason. TY.

## 2018-02-09 ENCOUNTER — Ambulatory Visit: Payer: Medicare Other | Admitting: Physician Assistant

## 2018-02-09 ENCOUNTER — Other Ambulatory Visit: Payer: Self-pay

## 2018-02-09 ENCOUNTER — Other Ambulatory Visit (INDEPENDENT_AMBULATORY_CARE_PROVIDER_SITE_OTHER): Payer: Medicare Other

## 2018-02-09 ENCOUNTER — Encounter: Payer: Self-pay | Admitting: Physician Assistant

## 2018-02-09 VITALS — BP 100/58 | HR 60 | Ht 72.0 in | Wt 204.0 lb

## 2018-02-09 DIAGNOSIS — K5520 Angiodysplasia of colon without hemorrhage: Secondary | ICD-10-CM | POA: Diagnosis not present

## 2018-02-09 DIAGNOSIS — D500 Iron deficiency anemia secondary to blood loss (chronic): Secondary | ICD-10-CM | POA: Diagnosis not present

## 2018-02-09 LAB — CBC WITH DIFFERENTIAL/PLATELET
BASOS ABS: 0 10*3/uL (ref 0.0–0.1)
BASOS PCT: 1.3 % (ref 0.0–3.0)
EOS ABS: 0.2 10*3/uL (ref 0.0–0.7)
Eosinophils Relative: 5.9 % — ABNORMAL HIGH (ref 0.0–5.0)
HCT: 26 % — ABNORMAL LOW (ref 39.0–52.0)
Hemoglobin: 8.6 g/dL — ABNORMAL LOW (ref 13.0–17.0)
LYMPHS ABS: 0.7 10*3/uL (ref 0.7–4.0)
Lymphocytes Relative: 21.5 % (ref 12.0–46.0)
MCHC: 33.1 g/dL (ref 30.0–36.0)
MCV: 82.9 fl (ref 78.0–100.0)
MONO ABS: 0.7 10*3/uL (ref 0.1–1.0)
Monocytes Relative: 19.7 % — ABNORMAL HIGH (ref 3.0–12.0)
NEUTROS ABS: 1.7 10*3/uL (ref 1.4–7.7)
NEUTROS PCT: 51.6 % (ref 43.0–77.0)
PLATELETS: 82 10*3/uL — AB (ref 150.0–400.0)
RBC: 3.13 Mil/uL — ABNORMAL LOW (ref 4.22–5.81)
RDW: 16.4 % — AB (ref 11.5–15.5)
WBC: 3.3 10*3/uL — ABNORMAL LOW (ref 4.0–10.5)

## 2018-02-09 LAB — BASIC METABOLIC PANEL
BUN: 27 mg/dL — AB (ref 6–23)
CALCIUM: 9.3 mg/dL (ref 8.4–10.5)
CHLORIDE: 97 meq/L (ref 96–112)
CO2: 30 meq/L (ref 19–32)
Creatinine, Ser: 2.31 mg/dL — ABNORMAL HIGH (ref 0.40–1.50)
GFR: 29.35 mL/min — ABNORMAL LOW (ref >60.00)
Glucose, Bld: 184 mg/dL — ABNORMAL HIGH (ref 70–99)
Potassium: 4.3 mEq/L (ref 3.5–5.1)
Sodium: 134 mEq/L — ABNORMAL LOW (ref 135–145)

## 2018-02-09 NOTE — Progress Notes (Addendum)
Subjective:    Patient ID: [name], male    DOB: 1941/06/21, 77 y.o.   MRN: [id number]  HPI [name] is a very nice 77 year old white male, known to Dr. Carlean Purl, recent hospitalization who comes in today for post hospital follow-up.  He was admitted 6/28 through 01/24/2018 with melena, anemia and mild iron deficiency.  He had presented with weakness and shortness of breath. He has history of O2 dependent COPD, coronary artery disease status post prior stents, atrial fibrillation for which she is on aspirin and Eliquis, pulmonary fibrosis, congestive heart failure with EF of 40 to 45%, sleep apnea and is status post abdominal aortic aneurysm repair in 2012. 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 He did require transfusions during this admission for hemoglobin in the 7 range.  Hemoglobin was 9.6 on discharge.  On follow-up labs 01/29/2018 hemoglobin 8.7 hematocrit of 26.4 MCV of 84 platelets 107. He is also been seen by hematology recently Dr. Lindi Adie for the thrombocytopenia with platelet count in the 80-100 range.  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  Bone marrow biopsy would be needed for definitive diagnosis but due to his multiple comorbidities decision was made to observe for now.  Line Patient says he has not been feeling well since discharge from the hospital.  He said some increase in dizziness, lightheadedness at times and says he feels like his legs are going to give out.  He feels best sitting or lying down.  Despite feeling very puny he tried to mow his grass this morning.  He does feel little bit more short of breath than usual, denies any chest pain.  Appetite is been fine no complaints of abdominal pain.  He is on oral iron and says his stools have appeared a little bit dark but no overt melena or  obvious blood.  Review of Systems Pertinent positive and negative review of systems were noted in the above HPI section.  All other review of systems was otherwise negative.  Outpatient Encounter Medications as of 02/09/2018  Medication Sig  . albuterol (PROVENTIL) (2.5 MG/3ML) 0.083% nebulizer solution 1 vial in nebulizer every 6 hours as needed Dx 496 (Patient taking differently: Take 2.5 mg by nebulization every 6 (six) hours as needed for wheezing or shortness of breath. 1 vial in nebulizer every 6 hours as needed Dx 496)  . ALPRAZolam (XANAX) 0.5 MG tablet Take 0.5 mg by mouth at bedtime.   [name] amiodarone (PACERONE) 100 MG tablet Take 1 tablet (100 mg total) by mouth daily.  [name] apixaban (ELIQUIS) 5 MG TABS tablet Take 1 tablet (5 mg total) by mouth 2 (two) times daily.  [name] aspirin EC 81 MG tablet Take 1 tablet (81 mg total) by mouth daily.  [name] atorvastatin (LIPITOR) 40 MG tablet Take 1 tablet (40 mg total) by mouth daily.  . Carboxymethylcellul-Glycerin (LUBRICATING EYE DROPS OP) Apply 1 drop to eye daily as needed (dry eyes).  . carvedilol (COREG) 3.125 MG tablet Take 1 tablet (3.125 mg total) by mouth daily.  . Cholecalciferol (VITAMIN D3) 2000 units capsule Take 2,000 Units by mouth daily.   . ferrous sulfate 325 (65 FE) MG tablet Take 1 tablet (325 mg total) by mouth daily with breakfast.  . insulin glargine (LANTUS) 100 UNIT/ML injection Inject 0.4 mLs (40 Units total)  into the skin at bedtime. (Patient taking differently: Inject 45 Units into the skin at bedtime. )  . levothyroxine (SYNTHROID, LEVOTHROID) 125 MCG tablet Take 1.5 tablets (188 mcg total) by mouth daily before breakfast. (Patient taking differently: Take 125 mcg by mouth daily before breakfast. )  . losartan (COZAAR) 50 MG tablet Take 1 tablet (50 mg total) by mouth daily.  . metFORMIN (GLUCOPHAGE XR) 500 MG 24 hr tablet Take 1 tablet (500 mg total) by mouth daily with breakfast.  . Multiple Vitamins-Minerals (MEGA  MULTIVITAMIN FOR MEN PO) Take 1 tablet by mouth daily.   . OXYGEN Inhale 2 L into the lungs continuous.  . pregabalin (LYRICA) 50 MG capsule Take 50 mg by mouth 2 (two) times daily.  . Semaglutide (OZEMPIC) 1 MG/DOSE SOPN Inject 1 mg into the skin once a week.  . tamsulosin (FLOMAX) 0.4 MG CAPS capsule Take 1 capsule (0.4 mg total) by mouth at bedtime.  [name] tiotropium (SPIRIVA HANDIHALER) 18 MCG inhalation capsule Place 1 capsule (18 mcg total) into inhaler and inhale daily.   No facility-administered encounter medications on file as of 02/09/2018.    Allergies  Allergen Reactions  . No Known Allergies    Patient Active Problem List   Diagnosis Date Noted  . Thrombocytopenia ([place]) 01/22/2018  . Symptomatic anemia 01/20/2018  . Dizziness 01/18/2018  . Chronic respiratory failure with hypoxia ([place]) 12/01/2017  . Lung mass 12/01/2017  . PAF (paroxysmal atrial fibrillation) ([place])   . Ischemic cardiomyopathy 09/02/2017  . Chronic systolic CHF (congestive heart failure) ([place]) 09/02/2017  . Aortic aneurysm ([place]) 09/02/2017  . Diabetes mellitus with complication, with long-term current use of insulin ([place]) 09/01/2017  . Anxiety and depression 09/01/2017  . COPD (chronic obstructive pulmonary disease) ([place]) 09/01/2017  . Hyperlipidemia 01/05/2014  . OSA on CPAP 06/10/2011  . Coronary atherosclerosis 11/23/2009  . Hypothyroidism 07/30/2009  . Essential hypertension 07/30/2009   Social History   Socioeconomic History  . Marital status: Married    Spouse name: Not on file  . Number of children: Not on file  . Years of education: Not on file  . Highest education level: Not on file  Occupational History  . 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 Financial resource strain: Not on file  . Food insecurity:    Worry: Not on file    Inability: Not on file  . Transportation needs:    Medical: Not on file    Non-medical: Not on file  Tobacco Use   . Smoking status: Former Smoker    Packs/day: 2.50    Years: 40.00    Pack years: 100.00    Types: Cigarettes, Pipe, Cigars    Last attempt to quit: 07/28/1998    Years since quitting: 19.5  . Smokeless tobacco: Never Used  Substance and Sexual Activity  . Alcohol use: No    Alcohol/week: 0.0 oz  . Drug use: No  . Sexual activity: Not on file  Lifestyle  . Physical activity:    Days per week: Not on file    Minutes per session: Not on file  . Stress: Not on file  Relationships  . Social connections:    Talks on phone: Not on file    Gets together: Not on file    Attends religious service: Not on file    Active member of club or organization: Not on file    Attends meetings of clubs or organizations:  Not on file    Relationship status: Not on file  . Intimate partner violence:    Fear of current or ex partner: Not on file    Emotionally abused: Not on file    Physically abused: Not on file    Forced sexual activity: Not on file  Other Topics Concern  . Not on file  Social History Narrative   Lives in the home with his wife   Sees [place] every 6 month for meds.   Denies asbestos exposure    Mr. Shepard family history includes Other in his mother.      Objective:    Vitals:   02/09/18 1052  BP: (!) 100/58  Pulse: 60    Physical Exam; well-developed elderly white male in no acute distress, chronically ill-appearing pale, pleasant on portable O2.  Blood pressure initially 100/58, on repeat sitting blood pressure 88/48 and standing 88/42 pulse in the low 60s.  Height 6 foot, weight 204, BMI 27.6.  HEENT; nontraumatic normocephalic EOMI PERRLA sclera anicteric conjunctiva pale oropharynx clear, Cardiovascular; regular rate and rhythm with S1-S2, Pulmonary; decreased breath sounds bilaterally, Abdomen; soft nontender nondistended bowel sounds are active no palpable mass or hepatosplenomegaly, Rectal; exam not done today, Extremities; no clubbing cyanosis or edema skin warm  and dry, Neuro/ psych alert and oriented, grossly nonfocal mood and affect appropriate       Assessment & Plan:   #78 77 year old white male here for post hospital follow-up after recent admission with anemia, melena and mild iron deficiency in setting of chronic anticoagulation. 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 EGD was negative. He has not had capsule endoscopy and certainly may have small bowel AVMs.  #2 continued and/or somewhat progressive generalized weakness, intermittent dizziness some increase in dyspnea and decrease in energy over the past couple of weeks. Suspect this may be multi-factorial with multisystem disease but certainly anemia contributing. Patient is mildly hypotensive in our office today and He had taken all of his meds this morning including amiodarone Coreg and Cozaar.  #3 COPD/O2 dependent 4.  Pulmonary fibrosis 5.  History of atrial fibrillation 6.  Chronic anticoagulation-on Eliquis and aspirin 7.  Congestive heart failure EF 40 to 45% 8.  Coronary artery disease status post stents  Plan; stat CBC and BMET. Patient advised to go home and rest today, push fluids. We obtained an appointment with primary care for tomorrow morning for further evaluation.  Patient was told not to take Cozaar in the morning until seen by primary care. If hemoglobin 8 or less patient would benefit from outpatient transfusions. Continue ferrous sulfate 325 mg daily Patient will need serial hemoglobins over the next couple of months to assure stability.  Addendum; hemoglobin today stable at 8.6 Glucose 184, BUN 27, creatinine 2.3 with GFR of 29.  This is worse as compared to 01/29/2018 with BUN 28 creatinine 1.52 and GFR 47.5.    Chelsy Parrales S Hila Bolding PA-C 02/09/2018   Cc: Shelda Pal*  Agree with Ms. Genia Harold assessment and plan. Gatha Mayer, MD, [facility]

## 2018-02-09 NOTE — Patient Instructions (Addendum)
We have had the lab tech come to our floor to draw your labs.   We will call you with lab results.   We have made you an appointment for tomorrow 02-10-18 with Dr. Hillard Danker in our Primary Care office on the first floor.  Do not take the Cozaar until after you see Dr. Okey Dupre.    If you are age 76 or older, your body mass index should be between 23-30. Your Body mass index is 27.67 kg/m. If this is out of the aforementioned range listed, please consider follow up with your Primary Care Provider.

## 2018-02-09 NOTE — Progress Notes (Signed)
cbc

## 2018-02-10 ENCOUNTER — Telehealth: Payer: Self-pay | Admitting: Internal Medicine

## 2018-02-10 ENCOUNTER — Encounter: Payer: Self-pay | Admitting: Internal Medicine

## 2018-02-10 ENCOUNTER — Ambulatory Visit: Payer: Medicare Other | Admitting: Internal Medicine

## 2018-02-10 VITALS — BP 150/60 | HR 63 | Temp 98.3°F | Ht 72.0 in | Wt 203.0 lb

## 2018-02-10 DIAGNOSIS — R42 Dizziness and giddiness: Secondary | ICD-10-CM | POA: Diagnosis not present

## 2018-02-10 DIAGNOSIS — J9611 Chronic respiratory failure with hypoxia: Secondary | ICD-10-CM | POA: Diagnosis not present

## 2018-02-10 DIAGNOSIS — N179 Acute kidney failure, unspecified: Secondary | ICD-10-CM

## 2018-02-10 DIAGNOSIS — D696 Thrombocytopenia, unspecified: Secondary | ICD-10-CM

## 2018-02-10 DIAGNOSIS — I5022 Chronic systolic (congestive) heart failure: Secondary | ICD-10-CM

## 2018-02-10 DIAGNOSIS — I10 Essential (primary) hypertension: Secondary | ICD-10-CM

## 2018-02-10 DIAGNOSIS — D649 Anemia, unspecified: Secondary | ICD-10-CM

## 2018-02-10 NOTE — Patient Instructions (Addendum)
We will arrange to do the iron infusion to help you feel better.   We want to check labs in 1 week so come to a  office to do the labs. If you are going to a lab other than the one at [facility] you have to call to make an appointment before you go.

## 2018-02-10 NOTE — Progress Notes (Signed)
Subjective:    Patient ID: [name], male    DOB: 26-Aug-1940, 77 y.o.   MRN: [id number]  HPI The patient is a 77 YO man coming in for several concerns including anemia (recent blood loss anemia with avm ablation in the colon with colonoscopy, recent labs yesterday dark stool but taking iron), dizziness (ongoing since February, some lightheadedness, feels like he may pass out, did get temporarily better with transfusion recently, denies syncope, denies blood in stool but taking iron so they are dark, BP very low at GI visit yesterday and they advised him to stop losartan temporarily and ask primary, he gets most dizzy out in the sun, he is not able to stay out more than 5 minutes), and some weight loss (down 50 pounds in the last 2-3 years per the computer and down about 15 in the last year, is concerned because his appetite is great and eating the same as usual, some stress from being primary caregiver of his spouse who is currently critically ill, denies depression, sleeping okay, some changes to his blood counts, some changes on CT scan abdomen) as well as kidney problems (ckd is noted on his chart but review of labs reveals fairly normal kidney function as recently as January of this year, most recent Cr 2.3 yesterday and was around 1.9 when he left the hospital and had improved to 1.5, he admits that he felt some swelling in his legs and took an old lasix pill he had at home day prior to labs at GI, not taking that anymore, hydrates with water and gatorade throughout the day).   Review of Systems  Constitutional: Positive for activity change, chills, fatigue and unexpected weight change. Negative for appetite change, diaphoresis and fever.  HENT: Negative.   Eyes: Negative.   Respiratory: Positive for shortness of breath. Negative for cough and chest tightness.   Cardiovascular: Negative for chest pain, palpitations and leg swelling.  Gastrointestinal: Negative for abdominal distention,  abdominal pain, anal bleeding, blood in stool, constipation, diarrhea, nausea and vomiting.  Musculoskeletal: Negative.   Skin: Negative.   Neurological: Positive for dizziness and light-headedness. Negative for tremors, seizures, syncope, facial asymmetry, speech difficulty, weakness, numbness and headaches.  Psychiatric/Behavioral: Negative.       Objective:   Physical Exam  Constitutional: He is oriented to person, place, and time. He appears well-developed and well-nourished.  HENT:  Head: Normocephalic and atraumatic.  Eyes: EOM are normal.  Neck: Normal range of motion.  Cardiovascular: Normal rate.  Pulmonary/Chest: Effort normal and breath sounds normal. No respiratory distress. He has no wheezes. He has no rales.  Abdominal: Soft. Bowel sounds are normal. He exhibits no distension. There is no tenderness. There is no rebound.  Musculoskeletal: He exhibits no edema.  Neurological: He is alert and oriented to person, place, and time. Coordination normal.  Skin: Skin is warm and dry.   Vitals:   02/10/18 0949  BP: (!) 150/60  Pulse: 63  Temp: 98.3 F (36.8 C)  TempSrc: Oral  Weight: 203 lb (92.1 kg)  Height: 6' (1.829 m)      Assessment & Plan:  Visit time 40 minutes: greater than 50% of that time was spent in face to face counseling and coordination of care with the patient: counseled about his CT scan with LAD in the abdomen, the changes in his blood counts and possible process related to that causing weight loss, anemia and the likely recovery, kidney function in general and review of  his labs from the last year with extensive teaching about this as well as possible etiologies of his dizziness.

## 2018-02-10 NOTE — Telephone Encounter (Signed)
Pt would like to become a new patient, please advise

## 2018-02-11 ENCOUNTER — Ambulatory Visit (INDEPENDENT_AMBULATORY_CARE_PROVIDER_SITE_OTHER): Payer: Medicare Other | Admitting: Surgery

## 2018-02-11 ENCOUNTER — Encounter (INDEPENDENT_AMBULATORY_CARE_PROVIDER_SITE_OTHER): Payer: Self-pay | Admitting: Surgery

## 2018-02-11 ENCOUNTER — Ambulatory Visit (INDEPENDENT_AMBULATORY_CARE_PROVIDER_SITE_OTHER): Payer: Self-pay

## 2018-02-11 ENCOUNTER — Telehealth: Payer: Self-pay

## 2018-02-11 VITALS — BP 121/56 | HR 65 | Ht 72.0 in | Wt 204.0 lb

## 2018-02-11 DIAGNOSIS — M65342 Trigger finger, left ring finger: Secondary | ICD-10-CM

## 2018-02-11 DIAGNOSIS — M79645 Pain in left finger(s): Secondary | ICD-10-CM

## 2018-02-11 MED ORDER — BUPIVACAINE HCL 0.5 % IJ SOLN
0.5000 mL | INTRAMUSCULAR | Status: AC | PRN
  Administered 2018-02-11: .5 mL

## 2018-02-11 MED ORDER — METHYLPREDNISOLONE ACETATE 40 MG/ML IJ SUSP
20.0000 mg | INTRAMUSCULAR | Status: AC | PRN
  Administered 2018-02-11: 20 mg

## 2018-02-11 NOTE — Telephone Encounter (Signed)
OK w me.  

## 2018-02-11 NOTE — Telephone Encounter (Signed)
Fine with me

## 2018-02-11 NOTE — Progress Notes (Signed)
Office Visit Note   Patient: [name]           Date of Birth: [birth year]           MRN: [id number] Visit Date: 02/11/2018              Requested by: Sharlene Dory, DO [address] [place], [place] [address] PCP: Sharlene Dory, DO   Assessment & Plan: Visit Diagnoses:  1. Pain in finger of left hand   2. Trigger finger, left ring finger     Plan: Today I offered conservative treatment with left ring finger A1 pulley Marcaine/Depo-Medrol injection.  Tolerated procedure without complication.  I advised patient that with the significant amount of triggering that he is having I am hoping that he gets good relief.  Patient really is not a good surgical candidate with his multiple medical issues.  We will follow-up with [place] PRN.  After sitting for about 2 to 3 minutes at the injection patient did report good relief of his pain with Marcaine in place.  Follow-Up Instructions: Return if symptoms worsen or fail to improve.   Orders:  Orders Placed This Encounter  Procedures  . XR Hand Complete Left   No orders of the defined types were placed in this encounter.     Procedures: Hand/UE Inj: L ring A1 for trigger finger on 02/11/2018 10:05 AM Details: 25 G needle, volar approach Medications: 0.5 mL bupivacaine 0.5 %; 20 mg methylPREDNISolone acetate 40 MG/ML Consent was given by the patient. Patient was prepped and draped in the usual sterile fashion.       Clinical Data: No additional findings.   Subjective: Chief Complaint  Patient presents with  . Left Ring Finger - Pain    HPI 77 year old white male comes in today with complaints of left ring finger pain and locking.  States that this is been worsening over the last 3 months.  He has had problems with right trigger thumb several years ago and states that Dr. Otelia Sergeant performed injection with complete relief.  He is asking for an injection for his left ring trigger finger.  He has  not seen another provider for this current problem.  No complaints of numbness tingling in his hand.  States that when he does have locking/triggering this is painful.  No injury to his hand.   Review of Systems Patient currently on O2 nasal cannula.  No current complaints of fever chills cardiac GI issues.  Objective: Vital Signs: BP (!) 121/56 (BP Location: Left Arm, Patient Position: Sitting, Cuff Size: Normal)   Pulse 65   Ht 6' (1.829 m)   Wt 204 lb (92.5 kg)   BMI 27.67 kg/m   Physical Exam  Constitutional: No distress.  HENT:  Head: Normocephalic and atraumatic.  Eyes: Pupils are equal, round, and reactive to light. EOM are normal.  Pulmonary/Chest: No respiratory distress.  Musculoskeletal:  Exam left hand patient does have obvious triggering of the ring finger.  Tender at the A1 pulley where the has locking.  Neurovas intact.  Skin: Skin is warm and dry.    Ortho Exam  Specialty Comments:  No specialty comments available.  Imaging: No results found.   PMFS History: Patient Active Problem List   Diagnosis Date Noted  . Thrombocytopenia (HCC) 01/22/2018  . Symptomatic anemia 01/20/2018  . Dizziness 01/18/2018  . Chronic respiratory failure with hypoxia (HCC) 12/01/2017  . Lung mass 12/01/2017  . PAF (paroxysmal  atrial fibrillation) (HCC)   . Ischemic cardiomyopathy 09/02/2017  . Chronic systolic CHF (congestive heart failure) (HCC) 09/02/2017  . Aortic aneurysm (HCC) 09/02/2017  . Diabetes mellitus with complication, with long-term current use of insulin (HCC) 09/01/2017  . Anxiety and depression 09/01/2017  . COPD (chronic obstructive pulmonary disease) (HCC) 09/01/2017  . Hyperlipidemia 01/05/2014  . OSA on CPAP 06/10/2011  . Coronary atherosclerosis 11/23/2009  . Hypothyroidism 07/30/2009  . Essential hypertension 07/30/2009   Past Medical History:  Diagnosis Date  . Anxiety   . Aortic aneurysm (HCC) 09/02/2017  . BENIGN PROSTATIC HYPERTROPHY  11/23/2009  . Cardiomyopathy (HCC) 08/28/2016  . Chronic systolic CHF (congestive heart failure) (HCC) 09/02/2017  . COPD (chronic obstructive pulmonary disease) (HCC)   . CORONARY ARTERY DISEASE 11/23/2009  . 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 DEGENERATIVE JOINT DISEASE 11/23/2009  . DEPRESSION 11/23/2009  . FATIGUE 11/23/2009  . GAIT DISTURBANCE 12/10/2009  . HEMOPTYSIS UNSPECIFIED 05/07/2010  . High cholesterol   . HYPERTENSION 07/30/2009  . HYPOTHYROIDISM 07/30/2009  . Ischemic cardiomyopathy 09/02/2017  . 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 On home oxygen therapy    "2-3L; 24/7" (09/10/2016)  . OSA on CPAP   . PTSD (post-traumatic stress disorder) 03/10/2012  . 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 RA (rheumatoid arthritis) (HCC) 06/11/2011   "qwhere" (09/10/2016)  . RESPIRATORY FAILURE, CHRONIC 07/31/2009  . Scleritis of both eyes 03/17/2014  . 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 Type II diabetes mellitus (HCC)     Family History  Problem Relation Age of Onset  . Other Mother        gun shot    Past Surgical History:  Procedure Laterality Date  . 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 ABDOMINAL EXPLORATION SURGERY  02/2004   w/LOA/notes 12/10/2010; small bowel obstruction repair with adhesiolysis   . BACK SURGERY    . CARDIAC CATHETERIZATION     2 heart caths in the past.  One in 2000s showed one ulcerated plaque  Rx medically; [facility] at [facility] [name] 09/05/2016  . CATARACT EXTRACTION W/ INTRAOCULAR LENS  IMPLANT, BILATERAL Bilateral 2000s  . COLECTOMY     hx of remote ileum resection due to bleeding  . COLONOSCOPY WITH PROPOFOL N/A 01/22/2018   Procedure: COLONOSCOPY WITH PROPOFOL;  Surgeon: Iva Boop, MD;  Location: [facility] ENDOSCOPY;  Service: Endoscopy;  Laterality: N/A;  . CORONARY ANGIOPLASTY WITH STENT PLACEMENT  09/10/2016  . CORONARY STENT INTERVENTION N/A 09/10/2016   Procedure: Coronary Stent Intervention;  Surgeon: Kathleene Hazel, MD;  Location: [facility] INVASIVE  CV LAB;  Service: Cardiovascular;  Laterality: N/A;  Distal RCA 4.0x16 Synergy  . ESOPHAGOGASTRODUODENOSCOPY (EGD) WITH PROPOFOL N/A 01/22/2018   Procedure: ESOPHAGOGASTRODUODENOSCOPY (EGD) WITH PROPOFOL;  Surgeon: Iva Boop, MD;  Location: [facility] ENDOSCOPY;  Service: Endoscopy;  Laterality: N/A;  . FEMORAL EMBOLOECTOMY Left 07/2000   with left leg ischemia; Dr. Hart Rochester, vascular  . GANGLION CYST EXCISION Right    "wrist"; Dr. Teressa Senter  . HOT HEMOSTASIS N/A 01/22/2018   Procedure: HOT HEMOSTASIS (ARGON PLASMA COAGULATION/BICAP);  Surgeon: Iva Boop, MD;  Location: [facility];  Service: Endoscopy;  Laterality: N/A;  . LUMBAR LAMINECTOMY  1972   Dr. Fannie Knee  . RIGHT/LEFT HEART CATH AND CORONARY ANGIOGRAPHY N/A 09/10/2016   Procedure: Right/Left Heart Cath and Coronary Angiography;  Surgeon: Kathleene Hazel, MD;  Location: [facility] INVASIVE CV LAB;  Service: Cardiovascular;  Laterality: N/A;  . TONSILLECTOMY     Social History  Occupational History  . Occupation: disabled Cytogeneticist, Ex Neurosurgeon: RETIRED  Tobacco Use  . Smoking status: Former Smoker    Packs/day: 2.50    Years: 40.00    Pack years: 100.00    Types: Cigarettes, Pipe, Cigars    Last attempt to quit: 07/28/1998    Years since quitting: 19.5  . Smokeless tobacco: Never Used  Substance and Sexual Activity  . Alcohol use: No    Alcohol/week: 0.0 oz  . Drug use: No  . Sexual activity: Not on file

## 2018-02-11 NOTE — Telephone Encounter (Signed)
appt sch °

## 2018-02-11 NOTE — Telephone Encounter (Signed)
Per dr crawfords orders faxed over to patient care center, patient has appt to get iron transfusion on Monday, July 22 8:00am---faxed orders are on tamara's desk at [facility]---patient given address,directions and phone number for patient care center.

## 2018-02-12 ENCOUNTER — Encounter: Payer: Self-pay | Admitting: Internal Medicine

## 2018-02-12 DIAGNOSIS — N179 Acute kidney failure, unspecified: Secondary | ICD-10-CM | POA: Insufficient documentation

## 2018-02-12 NOTE — Assessment & Plan Note (Signed)
We talked about how his chronic lung disease could be causing some of his weight loss if this is progressive and he is now requiring more energy to maintain breathing relatively taking more calories. He is stable currently.

## 2018-02-12 NOTE — Assessment & Plan Note (Signed)
Looking back it appears that last known prior to the anemia was normal kidney function. Last stable 1.5 Cr after leaving hospital and now 2.3 after diuretic usage. He is off losartan now and advised him to take no more diuretics at home. Push fluids and check renal function in 1-2 weeks.

## 2018-02-12 NOTE — Assessment & Plan Note (Signed)
He is asked to stop taking any old lasix due to kidney function. No fluid noted on exam.

## 2018-02-12 NOTE — Assessment & Plan Note (Signed)
Will stop losartan currently due to AKI and low BP on labs. BP is acceptable today off meds. He is taking coreg daily, amiodarone daily still. Checking CMP in about 1 week.

## 2018-02-12 NOTE — Assessment & Plan Note (Addendum)
Concerning that this could be related to anemia still with labs stable yesterday but not improving. Will arrange iron transfusion for him to help with recovery as he is having some difficulty in tolerating oral iron pills. We talked about how there may be some underlying bone marrow problems and this is not yet clear. We will need to check CBC in about 1-2 weeks and labs placed today. Not typical for vertigo and no symptoms with epley in the office.

## 2018-02-12 NOTE — Assessment & Plan Note (Signed)
Checking CBC in about 1-2 weeks. Arrange iron transfusion to help with recovery of blood counts and symptoms. He is symptomatic currently.

## 2018-02-12 NOTE — Assessment & Plan Note (Signed)
We talked about how it is slightly more suspicious for process given LAD on CT abdomen as well as some systemic symptoms which may or may not be related to pancytopenia.

## 2018-02-15 ENCOUNTER — Ambulatory Visit (HOSPITAL_COMMUNITY)
Admission: RE | Admit: 2018-02-15 | Discharge: 2018-02-15 | Disposition: A | Discharge status: Home / Self Care | Payer: Medicare Other | Source: Ambulatory Visit | Attending: Internal Medicine | Admitting: Internal Medicine

## 2018-02-15 DIAGNOSIS — D509 Iron deficiency anemia, unspecified: Secondary | ICD-10-CM | POA: Insufficient documentation

## 2018-02-15 MED ORDER — IRON DEXTRAN 50 MG/ML IJ SOLN
25.0000 mg | Freq: Once | INTRAMUSCULAR | Status: AC
  Administered 2018-02-15: 25 mg via INTRAVENOUS
  Filled 2018-02-15: qty 0.5

## 2018-02-15 MED ORDER — SODIUM CHLORIDE 0.9 % IV SOLN
975.0000 mg | Freq: Once | INTRAVENOUS | Status: AC
  Administered 2018-02-15: 975 mg via INTRAVENOUS
  Filled 2018-02-15: qty 19.5

## 2018-02-15 MED ORDER — SODIUM CHLORIDE 0.9 % IV SOLN
Freq: Once | INTRAVENOUS | Status: AC
  Administered 2018-02-15: 10 mL/h via INTRAVENOUS

## 2018-02-15 NOTE — Discharge Instructions (Signed)

## 2018-02-15 NOTE — Progress Notes (Signed)
Patient received Iron Dextran via PIV. Test dose was first run with no adverse reactions. Patient observed post infusion before the main bag was infused. Patient tolerated well, vitals stable, discharge instructions given, verbalized understanding. Patient alert, oriented and was wheeled out at the time of discharge to his transportation.

## 2018-02-16 ENCOUNTER — Encounter (INDEPENDENT_AMBULATORY_CARE_PROVIDER_SITE_OTHER): Payer: Self-pay | Admitting: Physical Medicine and Rehabilitation

## 2018-02-16 ENCOUNTER — Ambulatory Visit (INDEPENDENT_AMBULATORY_CARE_PROVIDER_SITE_OTHER): Payer: Medicare Other | Admitting: Physical Medicine and Rehabilitation

## 2018-02-16 VITALS — BP 146/70 | HR 53 | Temp 98.6°F | Ht 73.0 in | Wt 207.0 lb

## 2018-02-16 DIAGNOSIS — M5416 Radiculopathy, lumbar region: Secondary | ICD-10-CM | POA: Diagnosis not present

## 2018-02-16 DIAGNOSIS — M961 Postlaminectomy syndrome, not elsewhere classified: Secondary | ICD-10-CM | POA: Diagnosis not present

## 2018-02-16 DIAGNOSIS — G8929 Other chronic pain: Secondary | ICD-10-CM

## 2018-02-16 DIAGNOSIS — G894 Chronic pain syndrome: Secondary | ICD-10-CM

## 2018-02-16 DIAGNOSIS — M5441 Lumbago with sciatica, right side: Secondary | ICD-10-CM

## 2018-02-16 DIAGNOSIS — M5442 Lumbago with sciatica, left side: Secondary | ICD-10-CM | POA: Diagnosis not present

## 2018-02-16 NOTE — Progress Notes (Signed)
 .  Numeric Pain Rating Scale and Functional Assessment Average Pain 7 Pain Right Now 5 My pain is intermittent Pain is worse with: walking and some activites Pain improves with: rest and therapy/exercise   In the last MONTH (on 0-10 scale) has pain interfered with the following?  1. General activity like being  able to carry out your everyday physical activities such as walking, climbing stairs, carrying groceries, or moving a chair?  Rating(6)  2. Relation with others like being able to carry out your usual social activities and roles such as  activities at home, at work and in your community. Rating(4)  3. Enjoyment of life such that you have  been bothered by emotional problems such as feeling anxious, depressed or irritable?  Rating(2)

## 2018-02-16 NOTE — Progress Notes (Signed)
[name] - 77 y.o. male MRN [id number]  Date of birth: [birth year]  Office Visit Note: Visit Date: 02/16/2018 PCP: Dakota Broker, MD Referred by: [name]*  Subjective: Chief Complaint  Patient presents with  . Lower Back - Pain  . Right Leg - Pain  . Left Leg - Pain   HPI: [name] is a pleasant 77 year old gentleman followed in the past by [name], who is very medically complicated with COPD, respiratory failure, kidney disease with long-term insulin-dependent diabetes with complications as well as heart failure. He had a stent placed last year and is on Plavix. He reports chronic worsening bilateral lower back pain into buttocks. Worse on right side.  He reports S1 injection performed in April 2018 gave him quite a bit of relief.  She reports new onset of pain over the last 2 months with no specific injury.  He states his pain is worse when he first gets up in the morning and he hurts when he walks for a long period of time.  He does do some stretching that helps.  Pain can be intermittent at times and sharp again in the mornings and with movement.  Rest and therapeutic exercise does seem to help.  Worse with bending. Prior MRI from 2017 shows multilevel spondylitic changes and degenerative change. He's had prior laminectomies in the lumbar spine. At L5-S1 there is some lateral recess narrowing due to arthropathy and degenerative disc height loss. He does have significant disc extrusion at T12-L1 but no compression.  He denies any focal new weakness but has been weak in general.  He really has chronic ongoing medical problems.  His newest issue is thrombocytopenia and what appears to be a pancytopenia.  Last platelets were 86,000.  In June he had a gastrointestinal hemorrhage.  He does endorse numbness of the bilateral feet from neuropathy.    Review of Systems  Constitutional: Positive for malaise/fatigue and weight loss. Negative for chills and fever.    HENT: Negative for hearing loss and sinus pain.   Eyes: Negative for blurred vision, double vision and photophobia.  Respiratory: Positive for cough and shortness of breath.   Cardiovascular: Negative for chest pain, palpitations and leg swelling.  Gastrointestinal: Negative for abdominal pain, nausea and vomiting.  Genitourinary: Negative for flank pain.  Musculoskeletal: Positive for back pain and joint pain. Negative for myalgias.  Skin: Negative for itching and rash.  Neurological: Positive for tingling and sensory change. Negative for tremors, focal weakness and weakness.  Endo/Heme/Allergies: Negative.   Psychiatric/Behavioral: Negative for depression.  All other systems reviewed and are negative.  Otherwise per HPI.  Assessment & Plan: Visit Diagnoses:  1. Chronic bilateral low back pain with bilateral sciatica   2. Lumbar radiculopathy   3. Post laminectomy syndrome   4. Chronic pain syndrome     Plan: Findings:  Chronic history of low back pain with radicular pain into the buttock and hamstring region we have felt to be related may be to lateral recess narrowing L5-S1.  Patient does not have any high-grade stenosis.  Pretty broad big disc bulging which usually is not an issue but at L5-S1 it is causing some crowding of the lateral recesses.  Prior S1 injection in April of last year gave him some good relief for several months.  He really is not a surgical candidate with complicated medical history.  Really not a good candidate for most medications at this point as well.  I do think  is worth repeating the injection and see how he does.  If it does not seem to help could possibly look at facet joint block.  Otherwise he continues with exercise and stretching.  We will see him back for the injection.  I discussed with him at great length increased risk of epidural hematoma on chronic anticoagulation especially with lower platelets.  At 86,000 he is not past the threshold to consider the  injection however.  This is a transforaminal approach and we did talk about the newer ideas about keeping the medication present with doing the injection.  He was on Plavix and we did the prior injection.  Greater than 50% of this visit (total duration of visit 30 minutes) was spent in counseling and coordination of care discussing conservative care versus interventional care for the lumbar spine including his problem with low platelets and anticoagulation.    Meds & Orders: No orders of the defined types were placed in this encounter.  No orders of the defined types were placed in this encounter.   Follow-up: Return for S1 transforaminal epidural steroid injection..   Procedures: No procedures performed  No notes on file   Clinical History: MRI LUMBAR SPINE WITHOUT CONTRAST 12/29/2015  TECHNIQUE: Multiplanar, multisequence MR imaging of the lumbar spine was performed. No intravenous contrast was administered.  COMPARISON:  None.  FINDINGS: Segmentation: 5 non rib-bearing lumbar type vertebral bodies are present.  Alignment: Slight retrolisthesis is present at L2-3. There is straightening of the normal lumbar lordosis. AP alignment is otherwise anatomic.  Vertebrae: Minimal chronic endplate marrow changes are present at L4-5.  Conus medullaris: Extends to the T12 level and appears normal.  Paraspinal and other soft tissues: A 2.3 cm cyst is present at the lower pole of the left kidney. No other focal lesions are present. There is no significant adenopathy.  Disc levels:  T11-12: A shallow central disc protrusion is present. There is no significant stenosis.  T12-L1: A superior disc extrusion extends 15 mm above the inferior endplate of T12. This partially effaces the ventral CSF. The foramina are patent bilaterally.  L1-2: A leftward broad-based disc protrusion is present. Mild facet hypertrophy is noted. This results in mild left central and foraminal  narrowing.  L2-3: A broad-based disc protrusion is present. Moderate facet hypertrophy is evident. Moderate subarticular narrowing is noted bilaterally. Mild foraminal narrowing is present bilaterally as well.  L3-4: A broad-based disc protrusion is present. Mild facet hypertrophy is noted. Moderate left and mild right subarticular narrowing is present. Moderate left and mild right foraminal narrowing is present.  L4-5: A left laminectomy is noted. Residual moderate left and mild right subarticular narrowing is present. Moderate left and mild right foraminal stenosis is stable.  L5-S1: A prominent central disc protrusion is again noted. There is moderate subarticular and bilateral foraminal narrowing without significant interval change.  IMPRESSION: 1. 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 2. Superior disc extrusion at T12-L1 extends 15 mm above the inferior endplate of T12. 3. Mild left central and foraminal stenosis at L1-2. 4. Moderate subarticular and mild foraminal narrowing bilaterally at L2-3. 5. Moderate left and mild right subarticular and foraminal narrowing bilaterally at L3-4 and L4-5 is stable. 6. Moderate subarticular and bilateral foraminal stenosis at L5-S1 without significant interval change.   He reports that he quit smoking about 19 years ago. His smoking use included cigarettes, pipe, and cigars. He has a 100.00 pack-year smoking history. He has never used smokeless tobacco.  Recent Labs    09/03/17 0259 02/17/18  HGBA1C 9.3* 8.2    Objective:  VS:  HT:6\' 1"  (185.4 cm)   WT:207 lb (93.9 kg)  BMI:27.32    BP:(!) 146/70  HR:(!) 53bpm  TEMP:98.6 F (37 C)(Oral)  RESP:96 % Physical Exam  Constitutional: He is oriented to person, place, and time. He appears well-developed. No distress.  Frail-appearing  HENT:  Head: Normocephalic and atraumatic.  Nose: Nose normal.  Mouth/Throat: Oropharynx is clear and moist.    Eyes: Conjunctivae are normal. Scleral icterus is present.  Neck: Normal range of motion. Neck supple. No tracheal deviation present.  Cardiovascular: Regular rhythm and intact distal pulses.  Pulmonary/Chest: Breath sounds normal. No respiratory distress.  Oxygen via nasal cannula  Abdominal: Soft. He exhibits no distension. There is no rebound and no guarding.  Musculoskeletal: He exhibits no deformity.  Patient ambulates without aid.  She is somewhat slow to rise from a seated position is very tall fellow.  He does have generalized muscle atrophy throughout the lumbar spine and lower extremities.  He has some pain with extension and facet loading.  No pain over the PSIS although some tenderness with deep palpation.  No pain over the greater trochanters and no pain with hip rotation.  He has has pretty good strength distally.  No clonus bilaterally.  Absent gastrocnemius reflexes.  Neurological: He is alert and oriented to person, place, and time. He displays normal reflexes. No sensory deficit. He exhibits normal muscle tone.  Skin: Skin is warm. No rash noted.  Psychiatric: He has a normal mood and affect. His behavior is normal.  Nursing note and vitals reviewed.   Ortho Exam Imaging: No results found.  Past Medical/Family/Surgical/Social History: Medications & Allergies reviewed per EMR, new medications updated. Patient Active Problem List   Diagnosis Date Noted  . AKI (acute kidney injury) (HCC) 02/12/2018  . Thrombocytopenia (HCC) 01/22/2018  . Symptomatic anemia 01/20/2018  . Dizziness 01/18/2018  . Chronic respiratory failure with hypoxia (HCC) 12/01/2017  . Lung mass 12/01/2017  . PAF (paroxysmal atrial fibrillation) (HCC)   . Ischemic cardiomyopathy 09/02/2017  . Chronic systolic CHF (congestive heart failure) (HCC) 09/02/2017  . Aortic aneurysm (HCC) 09/02/2017  . Diabetes mellitus with complication, with long-term current use of insulin (HCC) 09/01/2017  . Anxiety and  depression 09/01/2017  . COPD (chronic obstructive pulmonary disease) (HCC) 09/01/2017  . Hyperlipidemia 01/05/2014  . OSA on CPAP 06/10/2011  . Coronary atherosclerosis 11/23/2009  . Hypothyroidism 07/30/2009  . Essential hypertension 07/30/2009   Past Medical History:  Diagnosis Date  . 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 Anxiety   . Aortic aneurysm (HCC) 09/02/2017  . BENIGN PROSTATIC HYPERTROPHY 11/23/2009  . Cardiomyopathy (HCC) 08/28/2016  . Chronic systolic CHF (congestive heart failure) (HCC) 09/02/2017  . COPD (chronic obstructive pulmonary disease) (HCC)   . CORONARY ARTERY DISEASE 11/23/2009  . 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 DEGENERATIVE JOINT DISEASE 11/23/2009  . DEPRESSION 11/23/2009  . FATIGUE 11/23/2009  . GAIT DISTURBANCE 12/10/2009  . HEMOPTYSIS UNSPECIFIED 05/07/2010  . High cholesterol   . HYPERTENSION 07/30/2009  . HYPOTHYROIDISM 07/30/2009  . Ischemic cardiomyopathy 09/02/2017  . 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 On home oxygen therapy    "2-3L; 24/7" (09/10/2016)  . OSA on CPAP   . PTSD (post-traumatic stress disorder) 03/10/2012  . 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 RA (rheumatoid arthritis) (HCC) 06/11/2011   "qwhere" (09/10/2016)  . RESPIRATORY FAILURE, CHRONIC 07/31/2009  .  Scleritis of both eyes 03/17/2014  . 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 Type II diabetes mellitus (HCC)    Family History  Problem Relation Age of Onset  . Other Mother        gun shot   Past Surgical History:  Procedure Laterality Date  . 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 ABDOMINAL EXPLORATION SURGERY  02/2004   w/LOA/notes 12/10/2010; small bowel obstruction repair with adhesiolysis   . BACK SURGERY    . CARDIAC CATHETERIZATION     2 heart caths in the past.  One in 2000s showed one ulcerated plaque  Rx medically; [facility] [name] 09/05/2016  . CATARACT EXTRACTION W/ INTRAOCULAR LENS  IMPLANT, BILATERAL Bilateral 2000s  . COLECTOMY     hx of  remote ileum resection due to bleeding  . COLONOSCOPY WITH PROPOFOL N/A 01/22/2018   Procedure: COLONOSCOPY WITH PROPOFOL;  Surgeon: Iva Boop, MD;  Location: [facility] ENDOSCOPY;  Service: Endoscopy;  Laterality: N/A;  . CORONARY ANGIOPLASTY WITH STENT PLACEMENT  09/10/2016  . CORONARY STENT INTERVENTION N/A 09/10/2016   Procedure: Coronary Stent Intervention;  Surgeon: Kathleene Hazel, MD;  Location: [facility] INVASIVE CV LAB;  Service: Cardiovascular;  Laterality: N/A;  Distal RCA 4.0x16 Synergy  . ESOPHAGOGASTRODUODENOSCOPY (EGD) WITH PROPOFOL N/A 01/22/2018   Procedure: ESOPHAGOGASTRODUODENOSCOPY (EGD) WITH PROPOFOL;  Surgeon: Iva Boop, MD;  Location: [facility] ENDOSCOPY;  Service: Endoscopy;  Laterality: N/A;  . FEMORAL EMBOLOECTOMY Left 07/2000   with left leg ischemia; Dr. Hart Rochester, vascular  . GANGLION CYST EXCISION Right    "wrist"; Dr. Teressa Senter  . HOT HEMOSTASIS N/A 01/22/2018   Procedure: HOT HEMOSTASIS (ARGON PLASMA COAGULATION/BICAP);  Surgeon: Iva Boop, MD;  Location: [facility];  Service: Endoscopy;  Laterality: N/A;  . LUMBAR LAMINECTOMY  1972   Dr. Fannie Knee  . RIGHT/LEFT HEART CATH AND CORONARY ANGIOGRAPHY N/A 09/10/2016   Procedure: Right/Left Heart Cath and Coronary Angiography;  Surgeon: Kathleene Hazel, MD;  Location: [facility] INVASIVE CV LAB;  Service: Cardiovascular;  Laterality: N/A;  . TONSILLECTOMY     Social History   Occupational History  . Occupation: disabled Cytogeneticist, Ex Neurosurgeon: RETIRED  Tobacco Use  . Smoking status: Former Smoker    Packs/day: 2.50    Years: 40.00    Pack years: 100.00    Types: Cigarettes, Pipe, Cigars    Last attempt to quit: 07/28/1998    Years since quitting: 19.7  . Smokeless tobacco: Never Used  Substance and Sexual Activity  . Alcohol use: No    Alcohol/week: 0.0 standard drinks  . Drug use: No  . Sexual activity: Not on file

## 2018-02-17 LAB — LIPID PANEL
Cholesterol: 111 (ref 0–200)
HDL: 26 — AB (ref 35–70)
LDL Cholesterol: 63
Triglycerides: 112 (ref 40–160)

## 2018-02-17 LAB — HEPATIC FUNCTION PANEL
ALT: 34 (ref 10–40)
AST: 31 (ref 14–40)
Alkaline Phosphatase: 319 — AB (ref 25–125)
Bilirubin, Total: 0.7

## 2018-02-17 LAB — BASIC METABOLIC PANEL
CREATININE: 1.7 — AB (ref 0.6–1.3)
Glucose: 68
POTASSIUM: 4.9 (ref 3.4–5.3)
SODIUM: 139 (ref 137–147)

## 2018-02-17 LAB — HEMOGLOBIN A1C: HEMOGLOBIN A1C: 8.2

## 2018-02-17 LAB — TSH: TSH: 0.81 (ref 0.41–5.90)

## 2018-02-18 ENCOUNTER — Other Ambulatory Visit (INDEPENDENT_AMBULATORY_CARE_PROVIDER_SITE_OTHER): Payer: Medicare Other

## 2018-02-18 DIAGNOSIS — N179 Acute kidney failure, unspecified: Secondary | ICD-10-CM | POA: Diagnosis not present

## 2018-02-18 LAB — CBC
HEMATOCRIT: 25.9 % — AB (ref 39.0–52.0)
Hemoglobin: 8.6 g/dL — ABNORMAL LOW (ref 13.0–17.0)
MCHC: 33 g/dL (ref 30.0–36.0)
MCV: 82.9 fl (ref 78.0–100.0)
PLATELETS: 86 10*3/uL — AB (ref 150.0–400.0)
RBC: 3.13 Mil/uL — AB (ref 4.22–5.81)
RDW: 17.2 % — ABNORMAL HIGH (ref 11.5–15.5)
WBC: 3.5 10*3/uL — AB (ref 4.0–10.5)

## 2018-02-18 LAB — RENAL FUNCTION PANEL
Albumin: 3.4 g/dL — ABNORMAL LOW (ref 3.5–5.2)
BUN: 21 mg/dL (ref 6–23)
CHLORIDE: 103 meq/L (ref 96–112)
CO2: 27 meq/L (ref 19–32)
CREATININE: 1.83 mg/dL — AB (ref 0.40–1.50)
Calcium: 8.8 mg/dL (ref 8.4–10.5)
GFR: 38.4 mL/min — AB (ref >60.00)
Glucose, Bld: 127 mg/dL — ABNORMAL HIGH (ref 70–99)
PHOSPHORUS: 3.8 mg/dL (ref 2.3–4.6)
Potassium: 4.5 mEq/L (ref 3.5–5.1)
SODIUM: 138 meq/L (ref 135–145)

## 2018-02-22 ENCOUNTER — Ambulatory Visit: Payer: Medicare Other | Admitting: Internal Medicine

## 2018-02-22 ENCOUNTER — Encounter: Payer: Self-pay | Admitting: Internal Medicine

## 2018-02-22 VITALS — BP 150/70 | HR 65 | Temp 97.6°F | Ht 73.0 in | Wt 208.0 lb

## 2018-02-22 DIAGNOSIS — N179 Acute kidney failure, unspecified: Secondary | ICD-10-CM | POA: Diagnosis not present

## 2018-02-22 DIAGNOSIS — R42 Dizziness and giddiness: Secondary | ICD-10-CM | POA: Diagnosis not present

## 2018-02-22 DIAGNOSIS — D649 Anemia, unspecified: Secondary | ICD-10-CM | POA: Diagnosis not present

## 2018-02-22 DIAGNOSIS — D696 Thrombocytopenia, unspecified: Secondary | ICD-10-CM | POA: Diagnosis not present

## 2018-02-22 NOTE — Assessment & Plan Note (Signed)
Has pancytopenia which is still concerning. He is seeing hematology this week. Platelets are stable around 80. No known malignancy and spep normal but does have LAD in the abdomen which could be concerning.

## 2018-02-22 NOTE — Assessment & Plan Note (Addendum)
Checking CBC in about 2-3 weeks. We talked about how iron infusion does not cause increase in counts rapidly. He is following up with hematology on Friday in consideration of his pancytopenia. If counts do not improve in the next 1-2 months could consider further bleeding versus myeloproliferative disorder.

## 2018-02-22 NOTE — Patient Instructions (Addendum)
We will check the blood work again in 2 weeks or so to check the blood counts.    Iron-Rich Diet Iron is a mineral that helps your body to produce hemoglobin. Hemoglobin is a protein in your red blood cells that carries oxygen to your body's tissues. Eating too little iron may cause you to feel weak and tired, and it can increase your risk for infection. Eating enough iron is necessary for your body's metabolism, muscle function, and nervous system. Iron is naturally found in many foods. It can also be added to foods or fortified in foods. There are two types of dietary iron:  Heme iron. Heme iron is absorbed by the body more easily than nonheme iron. Heme iron is found in meat, poultry, and fish.  Nonheme iron. Nonheme iron is found in dietary supplements, iron-fortified grains, beans, and vegetables.  You may need to follow an iron-rich diet if:  You have been diagnosed with iron deficiency or iron-deficiency anemia.  You have a condition that prevents you from absorbing dietary iron, such as: ? Infection in your intestines. ? Celiac disease. This involves long-lasting (chronic) inflammation of your intestines.  You do not eat enough iron.  You eat a diet that is high in foods that impair iron absorption.  You have lost a lot of blood.  You have heavy bleeding during your menstrual cycle.  You are pregnant.  What is my plan? Your health care provider may help you to determine how much iron you need per day based on your condition. Generally, when a person consumes sufficient amounts of iron in the diet, the following iron needs are met:  Men. ? 75-41 years old: 11 mg per day. ? 52-41 years old: 8 mg per day.  Women. ? 84-53 years old: 15 mg per day. ? 35-89 years old: 18 mg per day. ? Over 23 years old: 8 mg per day. ? Pregnant women: 27 mg per day. ? Breastfeeding women: 9 mg per day.  What do I need to know about an iron-rich diet?  Eat fresh fruits and vegetables  that are high in vitamin C along with foods that are high in iron. This will help increase the amount of iron that your body absorbs from food, especially with foods containing nonheme iron. Foods that are high in vitamin C include oranges, peppers, tomatoes, and mango.  Take iron supplements only as directed by your health care provider. Overdose of iron can be life-threatening. If you were prescribed iron supplements, take them with orange juice or a vitamin C supplement.  Cook foods in pots and pans that are made from iron.  Eat nonheme iron-containing foods alongside foods that are high in heme iron. This helps to improve your iron absorption.  Certain foods and drinks contain compounds that impair iron absorption. Avoid eating these foods in the same meal as iron-rich foods or with iron supplements. These include: ? Coffee, black tea, and red wine. ? Milk, dairy products, and foods that are high in calcium. ? Beans, soybeans, and peas. ? Whole grains.  When eating foods that contain both nonheme iron and compounds that impair iron absorption, follow these tips to absorb iron better. ? Soak beans overnight before cooking. ? Soak whole grains overnight and drain them before using. ? Ferment flours before baking, such as using yeast in bread dough. What foods can I eat? Grains Iron-fortified breakfast cereal. Iron-fortified whole-wheat bread. Enriched rice. Sprouted grains. Vegetables Spinach. Potatoes with skin. Green peas.  Broccoli. Red and green bell peppers. Fermented vegetables. Fruits Prunes. Raisins. Oranges. Strawberries. Mango. Grapefruit. Meats and Other Protein Sources Beef liver. Oysters. Beef. Shrimp. [place]. Chicken. [place]. Sardines. Chickpeas. Nuts. Tofu. Beverages Tomato juice. Fresh orange juice. Prune juice. Hibiscus tea. Fortified instant breakfast shakes. Condiments Tahini. Fermented soy sauce. Sweets and Desserts Black-strap molasses. Other Wheat germ. The  items listed above may not be a complete list of recommended foods or beverages. Contact your dietitian for more options. What foods are not recommended? Grains Whole grains. Bran cereal. Bran flour. Oats. Vegetables Artichokes. Brussels sprouts. Kale. Fruits Blueberries. Raspberries. Strawberries. Figs. Meats and Other Protein Sources Soybeans. Products made from soy protein. Dairy Milk. Cream. Cheese. Yogurt. Cottage cheese. Beverages Coffee. Black tea. Red wine. Sweets and Desserts Cocoa. Chocolate. Ice cream. Other Basil. Oregano. Parsley. The items listed above may not be a complete list of foods and beverages to avoid. Contact your dietitian for more information. This information is not intended to replace advice given to you by your health care provider. Make sure you discuss any questions you have with your health care provider. Document Released: 02/25/2005 Document Revised: 02/01/2016 Document Reviewed: 02/08/2014 Elsevier Interactive Patient Education  [employer].

## 2018-02-22 NOTE — Progress Notes (Signed)
   Subjective:    Patient ID: [name], male    DOB: 22-Mar-1941, 77 y.o.   MRN: [id number]  HPI The patient is a 77 YO man coming in for follow up of AKI (stopped ARB and not taking lasix anymore, denies diet or appetite changes, weight is up 1 pound since last time, urinating normally) and anemia (got iron infusion about 1 week ago, is feeling some better, blood counts stable after iron infusion, is still taking iron pills and wants to know if he needs to continue) and dizziness (rare episodes of dizziness, feeling better after iron infusion, he is able to be outside in the sun without extreme fatigue, no longer napping during the day)  Review of Systems  Constitutional: Negative.   HENT: Negative.   Eyes: Negative.   Respiratory: Negative for cough, chest tightness and shortness of breath.   Cardiovascular: Negative for chest pain, palpitations and leg swelling.  Gastrointestinal: Negative for abdominal distention, abdominal pain, constipation, diarrhea, nausea and vomiting.  Musculoskeletal: Negative.   Skin: Negative.   Neurological: Positive for dizziness.       Improved  Psychiatric/Behavioral: Negative.       Objective:   Physical Exam  Constitutional: He is oriented to person, place, and time. He appears well-developed and well-nourished.  HENT:  Head: Normocephalic and atraumatic.  Eyes: EOM are normal.  Neck: Normal range of motion.  Cardiovascular: Normal rate and regular rhythm.  Pulmonary/Chest: Effort normal and breath sounds normal. No respiratory distress. He has no wheezes. He has no rales.  oxygen  Abdominal: Soft. Bowel sounds are normal. He exhibits no distension. There is no tenderness. There is no rebound.  Musculoskeletal: He exhibits no edema.  Neurological: He is alert and oriented to person, place, and time. Coordination normal.  More stable than prior  Skin: Skin is warm and dry.  Psychiatric: He has a normal mood and affect.   Vitals:   02/22/18 0838  BP: (!) 150/70  Pulse: 65  Temp: 97.6 F (36.4 C)  TempSrc: Oral  SpO2: 91%  Weight: 208 lb (94.3 kg)  Height: 6\' 1"  (1.854 m)      Assessment & Plan:

## 2018-02-22 NOTE — Assessment & Plan Note (Signed)
Some improvement which is significant after iron infusion. Counts stable which is indicative of no more bleeding. We will expect symptoms to improve gradually as blood counts are replaced.

## 2018-02-22 NOTE — Assessment & Plan Note (Signed)
Cr improving off lasix. Continue off ARB. Most recent GFR around 38 although prior to bleeding episode GFR was around 50-60 and was normal.

## 2018-02-23 ENCOUNTER — Ambulatory Visit (INDEPENDENT_AMBULATORY_CARE_PROVIDER_SITE_OTHER): Payer: Self-pay

## 2018-02-23 ENCOUNTER — Ambulatory Visit (INDEPENDENT_AMBULATORY_CARE_PROVIDER_SITE_OTHER): Payer: Medicare Other | Admitting: Physical Medicine and Rehabilitation

## 2018-02-23 VITALS — BP 152/69 | HR 55 | Temp 98.3°F

## 2018-02-23 DIAGNOSIS — M961 Postlaminectomy syndrome, not elsewhere classified: Secondary | ICD-10-CM | POA: Diagnosis not present

## 2018-02-23 DIAGNOSIS — M5416 Radiculopathy, lumbar region: Secondary | ICD-10-CM | POA: Diagnosis not present

## 2018-02-23 MED ORDER — BETAMETHASONE SOD PHOS & ACET 6 (3-3) MG/ML IJ SUSP
12.0000 mg | Freq: Once | INTRAMUSCULAR | Status: AC
  Administered 2018-02-23: 12 mg

## 2018-02-23 NOTE — Progress Notes (Signed)
  Numeric Pain Rating Scale and Functional Assessment Average Pain (5)   In the last MONTH (on 0-10 scale) has pain interfered with the following?  1. General activity like being  able to carry out your everyday physical activities such as walking, climbing stairs, carrying groceries, or moving a chair?  Rating(5)   +Driver, -BT, -Dye Allergies.  

## 2018-02-23 NOTE — Patient Instructions (Signed)

## 2018-02-26 ENCOUNTER — Inpatient Hospital Stay: Payer: Medicare Other | Attending: Hematology and Oncology

## 2018-02-26 ENCOUNTER — Inpatient Hospital Stay: Payer: Medicare Other | Admitting: Hematology and Oncology

## 2018-02-26 VITALS — BP 130/60 | HR 62 | Temp 98.1°F | Resp 17 | Ht 73.0 in | Wt 210.9 lb

## 2018-02-26 DIAGNOSIS — I129 Hypertensive chronic kidney disease with stage 1 through stage 4 chronic kidney disease, or unspecified chronic kidney disease: Secondary | ICD-10-CM | POA: Insufficient documentation

## 2018-02-26 DIAGNOSIS — Z794 Long term (current) use of insulin: Secondary | ICD-10-CM | POA: Insufficient documentation

## 2018-02-26 DIAGNOSIS — Z7901 Long term (current) use of anticoagulants: Secondary | ICD-10-CM | POA: Diagnosis not present

## 2018-02-26 DIAGNOSIS — E1122 Type 2 diabetes mellitus with diabetic chronic kidney disease: Secondary | ICD-10-CM

## 2018-02-26 DIAGNOSIS — D72819 Decreased white blood cell count, unspecified: Secondary | ICD-10-CM | POA: Insufficient documentation

## 2018-02-26 DIAGNOSIS — N189 Chronic kidney disease, unspecified: Secondary | ICD-10-CM

## 2018-02-26 DIAGNOSIS — D631 Anemia in chronic kidney disease: Secondary | ICD-10-CM | POA: Diagnosis not present

## 2018-02-26 DIAGNOSIS — D696 Thrombocytopenia, unspecified: Secondary | ICD-10-CM

## 2018-02-26 DIAGNOSIS — R0602 Shortness of breath: Secondary | ICD-10-CM

## 2018-02-26 DIAGNOSIS — Z79899 Other long term (current) drug therapy: Secondary | ICD-10-CM | POA: Insufficient documentation

## 2018-02-26 DIAGNOSIS — D649 Anemia, unspecified: Secondary | ICD-10-CM

## 2018-02-26 LAB — CBC WITH DIFFERENTIAL (CANCER CENTER ONLY)
Basophils Absolute: 0 10*3/uL (ref 0.0–0.1)
Basophils Relative: 1 %
Eosinophils Absolute: 0.2 10*3/uL (ref 0.0–0.5)
Eosinophils Relative: 4 %
HEMATOCRIT: 28 % — AB (ref 38.4–49.9)
HEMOGLOBIN: 9.1 g/dL — AB (ref 13.0–17.1)
LYMPHS ABS: 1.3 10*3/uL (ref 0.9–3.3)
LYMPHS PCT: 30 %
MCH: 27.5 pg (ref 27.2–33.4)
MCHC: 32.3 g/dL (ref 32.0–36.0)
MCV: 85.2 fL (ref 79.3–98.0)
MONOS PCT: 15 %
Monocytes Absolute: 0.6 10*3/uL (ref 0.1–0.9)
NEUTROS PCT: 50 %
Neutro Abs: 2.1 10*3/uL (ref 1.5–6.5)
Platelet Count: 92 10*3/uL — ABNORMAL LOW (ref 140–400)
RBC: 3.29 MIL/uL — AB (ref 4.20–5.82)
RDW: 18.9 % — ABNORMAL HIGH (ref 11.0–14.6)
WBC: 4.2 10*3/uL (ref 4.0–10.3)

## 2018-02-26 NOTE — Assessment & Plan Note (Signed)
Normocytic anemia: Attributed to anemia chronic kidney disease, ideally we would need to do a bone marrow biopsy but because of his multiple health issues, PR performing supportive care only. Lab review: 02/18/2018: Hemoglobin 8.6, MCV 83, WBC 3.5, platelets 86 creatinine 1.83  Recommendation: 1.  Blood transfusions if hemoglobin is less than 8 or symptomatic anemia 2. monitoring of blood counts monthly 3.  Follow-up in 3 months with labs

## 2018-02-26 NOTE — Progress Notes (Signed)
Patient Care Team: [name], MD as PCP - General (Internal Medicine) Clent Jacks, MD as Consulting Physician (Ophthalmology) Brand Males, MD as Consulting Physician (Pulmonary Disease) System, Provider Not In as Consulting Physician Lahoma Rocker, MD as Consulting Physician (Rheumatology) Rigoberto Noel, MD as Consulting Physician (Pulmonary Disease) Lelon Perla, MD as Consulting Physician (Cardiology)  DIAGNOSIS:  Encounter Diagnoses  Name Primary?  . Thrombocytopenia ([place]) Yes  . Symptomatic anemia     CHIEF COMPLIANT: Follow-up of normocytic anemia attributed to anemia of chronic kidney disease as well as chronic medical illnesses  INTERVAL HISTORY: [name] is a 77 year old with multiple medical problems including diabetes and recent hospitalization which where he was diagnosed with chronic kidney disease who is here for a follow-up of his anemia and thrombus cytopenia.  His platelet counts are remained stable.  His hemoglobin fluctuates between 8.5 and 9.  Today's hemoglobin is 9.1.  He reports markedly improved energy levels.  He uses oxygen by nasal cannula.  REVIEW OF SYSTEMS:   Constitutional: Denies fevers, chills or abnormal weight loss Eyes: Denies blurriness of vision Ears, nose, mouth, throat, and face: Denies mucositis or sore throat Respiratory: Shortness of breath requires oxygen Cardiovascular: Denies palpitation, chest discomfort Gastrointestinal:  Denies nausea, heartburn or change in bowel habits Skin: Denies abnormal skin rashes Lymphatics: Denies new lymphadenopathy or easy bruising Neurological:Denies numbness, tingling or new weaknesses Behavioral/Psych: Mood is stable, no new changes  Extremities: No lower extremity edema   All other systems were reviewed with the patient and are negative.  I have reviewed the past medical history, past surgical history, social history and family history with the patient and they  are unchanged from previous note.  ALLERGIES:  is allergic to no known allergies.  MEDICATIONS:  Current Outpatient Medications  Medication Sig Dispense Refill  . albuterol (PROVENTIL) (2.5 MG/3ML) 0.083% nebulizer solution 1 vial in nebulizer every 6 hours as needed Dx 496 (Patient taking differently: Take 2.5 mg by nebulization every 6 (six) hours as needed for wheezing or shortness of breath. 1 vial in nebulizer every 6 hours as needed Dx 496) 120 mL 6  . ALPRAZolam (XANAX) 0.5 MG tablet Take 0.5 mg by mouth at bedtime.     [name] amiodarone (PACERONE) 100 MG tablet Take 1 tablet (100 mg total) by mouth daily. 30 tablet 0  . apixaban (ELIQUIS) 5 MG TABS tablet Take 1 tablet (5 mg total) by mouth 2 (two) times daily. 180 tablet 3  . aspirin EC 81 MG tablet Take 1 tablet (81 mg total) by mouth daily.    [name] atorvastatin (LIPITOR) 40 MG tablet Take 1 tablet (40 mg total) by mouth daily. 90 tablet 3  . Carboxymethylcellul-Glycerin (LUBRICATING EYE DROPS OP) Apply 1 drop to eye daily as needed (dry eyes).    . carvedilol (COREG) 3.125 MG tablet Take 1 tablet (3.125 mg total) by mouth daily. 180 tablet 0  . Cholecalciferol (VITAMIN D3) 2000 units capsule Take 2,000 Units by mouth daily.     . ferrous sulfate 325 (65 FE) MG tablet Take 1 tablet (325 mg total) by mouth daily with breakfast. 30 tablet 0  . insulin glargine (LANTUS) 100 UNIT/ML injection Inject 0.4 mLs (40 Units total) into the skin at bedtime. (Patient taking differently: Inject 45 Units into the skin at bedtime. ) 10 mL 0  . levothyroxine (SYNTHROID, LEVOTHROID) 125 MCG tablet Take 1.5 tablets (188 mcg total) by mouth daily before breakfast. (Patient taking differently: Take  125 mcg by mouth daily before breakfast. ) 60 tablet 0  . metFORMIN (GLUCOPHAGE XR) 500 MG 24 hr tablet Take 1 tablet (500 mg total) by mouth daily with breakfast. 90 tablet 1  . Multiple Vitamins-Minerals (MEGA MULTIVITAMIN FOR MEN PO) Take 1 tablet by mouth daily.      . OXYGEN Inhale 2 L into the lungs continuous.    . pregabalin (LYRICA) 50 MG capsule Take 50 mg by mouth 2 (two) times daily.    . Semaglutide (OZEMPIC) 1 MG/DOSE SOPN Inject 1 mg into the skin once a week.    . tamsulosin (FLOMAX) 0.4 MG CAPS capsule Take 1 capsule (0.4 mg total) by mouth at bedtime. 30 capsule 0  . tiotropium (SPIRIVA HANDIHALER) 18 MCG inhalation capsule Place 1 capsule (18 mcg total) into inhaler and inhale daily. 30 capsule 4   No current facility-administered medications for this visit.     PHYSICAL EXAMINATION: ECOG PERFORMANCE STATUS: 1 - Symptomatic but completely ambulatory  Vitals:   02/26/18 1059  BP: 130/60  Pulse: 62  Resp: 17  Temp: 98.1 F (36.7 C)  SpO2: 93%   Filed Weights   02/26/18 1059  Weight: 210 lb 14.4 oz (95.7 kg)    GENERAL:alert, no distress and comfortable SKIN: skin color, texture, turgor are normal, no rashes or significant lesions EYES: normal, Conjunctiva are pink and non-injected, sclera clear OROPHARYNX:no exudate, no erythema and lips, buccal mucosa, and tongue normal  NECK: supple, thyroid normal size, non-tender, without nodularity LYMPH:  no palpable lymphadenopathy in the cervical, axillary or inguinal LUNGS: clear to auscultation and percussion with normal breathing effort HEART: regular rate & rhythm and no murmurs and no lower extremity edema ABDOMEN:abdomen soft, non-tender and normal bowel sounds MUSCULOSKELETAL:no cyanosis of digits and no clubbing  NEURO: alert & oriented x 3 with fluent speech, no focal motor/sensory deficits EXTREMITIES: No lower extremity edema   LABORATORY DATA:  I have reviewed the data as listed CMP Latest Ref Rng & Units 02/18/2018 02/09/2018 01/29/2018  Glucose 70 - 99 mg/dL 127(H) 184(H) 320(H)  BUN 6 - 23 mg/dL 21 27(H) 28(H)  Creatinine 0.40 - 1.50 mg/dL 1.83(H) 2.31(H) 1.52(H)  Sodium 135 - 145 mEq/L 138 134(L) 135  Potassium 3.5 - 5.1 mEq/L 4.5 4.3 5.1  Chloride 96 - 112 mEq/L  103 97 102  CO2 19 - 32 mEq/L _0 Calcium 8.4 - 10.5 mg/dL 8.8 9.3 9.1  Total Protein 6.5 - 8.1 g/dL - - -  Total Bilirubin 0.3 - 1.2 mg/dL - - -  Alkaline Phos 38 - 126 U/L - - -  AST 15 - 41 U/L - - -  ALT 0 - 44 U/L - - -    Lab Results  Component Value Date   WBC 4.2 02/26/2018   HGB 9.1 (L) 02/26/2018   HCT 28.0 (L) 02/26/2018   MCV 85.2 02/26/2018   PLT 92 (L) 02/26/2018   NEUTROABS 2.1 02/26/2018    ASSESSMENT & PLAN:  Symptomatic anemia Normocytic anemia: Attributed to anemia chronic kidney disease, ideally we would need to do a bone marrow biopsy but because of his multiple health issues, PR performing supportive care only. Lab review: 02/18/2018: Hemoglobin 8.6, MCV 83, WBC 3.5, platelets 86 creatinine 1.83 02/26/2018: Hemoglobin 9.1, platelets 92, MCV 85  Recommendation: 1.  Blood transfusions if hemoglobin is less than 8 or symptomatic anemia 2. monitoring of blood counts monthly 3.  Follow-up in 3 months with labs  No orders of the defined types were placed in this encounter.  The patient has a good understanding of the overall plan. he agrees with it. he will call with any problems that may develop before the next visit here.   Harriette Ohara, MD 02/26/18

## 2018-03-01 ENCOUNTER — Encounter: Payer: Self-pay | Admitting: Internal Medicine

## 2018-03-01 ENCOUNTER — Telehealth: Payer: Self-pay | Admitting: Hematology and Oncology

## 2018-03-01 NOTE — Telephone Encounter (Signed)
Mailed patient calendar of upcoming nov appts.

## 2018-03-01 NOTE — Progress Notes (Signed)
Abstracted and sent to scan  

## 2018-03-02 NOTE — Procedures (Signed)
S1 Lumbosacral Transforaminal Epidural Steroid Injection - Sub-Pedicular Approach with Fluoroscopic Guidance   Patient: [name]      Date of Birth: 16-Dec-1940 MRN: [id number] PCP: Myrlene Broker, MD      Visit Date: 02/23/2018   Universal Protocol:    Date/Time: 08/06/196:41 AM  Consent Given By: the patient  Position:  PRONE  Additional Comments: Vital signs were monitored before and after the procedure. Patient was prepped and draped in the usual sterile fashion. The correct patient, procedure, and site was verified.   Injection Procedure Details:  Procedure Site One Meds Administered:  Meds ordered this encounter  Medications  . betamethasone acetate-betamethasone sodium phosphate (CELESTONE) injection 12 mg    Laterality: Bilateral  Location/Site:  S1 Foramen   Needle size: 22 ga.  Needle type: Spinal  Needle Placement: Transforaminal  Findings:   -Comments: Excellent flow of contrast along the nerve and into the epidural space.  Procedure Details: After squaring off the sacral end-plate to get a true AP view, the C-arm was positioned so that the best possible view of the S1 foramen was visualized. The soft tissues overlying this structure were infiltrated with 2-3 ml. of 1% Lidocaine without Epinephrine.    The spinal needle was inserted toward the target using a "trajectory" view along the fluoroscope beam.  Under AP and lateral visualization, the needle was advanced so it did not puncture dura. Biplanar projections were used to confirm position. Aspiration was confirmed to be negative for CSF and/or blood. A 1-2 ml. volume of Isovue-250 was injected and flow of contrast was noted at each level. Radiographs were obtained for documentation purposes.   After attaining the desired flow of contrast documented above, a 0.5 to 1.0 ml test dose of 0.25% Marcaine was injected into each respective transforaminal space.  The patient was observed for 90  seconds post injection.  After no sensory deficits were reported, and normal lower extremity motor function was noted,   the above injectate was administered so that equal amounts of the injectate were placed at each foramen (level) into the transforaminal epidural space.   Additional Comments:  The patient tolerated the procedure well Dressing: Band-Aid    Post-procedure details: Patient was observed during the procedure. Post-procedure instructions were reviewed.  Patient left the clinic in stable condition.

## 2018-03-02 NOTE — Progress Notes (Signed)
[name] - 77 y.o. male MRN [id number]  Date of birth: 1941-02-14  Office Visit Note: Visit Date: 02/23/2018 PCP: Myrlene Broker, MD Referred by: Myrlene Broker, *  Subjective: Chief Complaint  Patient presents with  . Lower Back - Pain   HPI: Mr. [name] is a 77 year old gentleman who comes in today for planned bilateral S1 transforaminal epidural steroid injection.  Please see our prior evaluation and management note for further details and justification.  Platelet count still holding in the 80s.  Patient is being evaluated for pancytopenia.   ROS Otherwise per HPI.  Assessment & Plan: Visit Diagnoses:  1. Lumbar radiculopathy   2. Post laminectomy syndrome     Plan: No additional findings.   Meds & Orders:  Meds ordered this encounter  Medications  . betamethasone acetate-betamethasone sodium phosphate (CELESTONE) injection 12 mg    Orders Placed This Encounter  Procedures  . XR C-ARM NO REPORT  . Epidural Steroid injection    Follow-up: Return if symptoms worsen or fail to improve.   Procedures: No procedures performed  S1 Lumbosacral Transforaminal Epidural Steroid Injection - Sub-Pedicular Approach with Fluoroscopic Guidance   Patient: [name]      Date of Birth: 09/08/1940 MRN: [id number] PCP: Myrlene Broker, MD      Visit Date: 02/23/2018   Universal Protocol:    Date/Time: 08/06/196:41 AM  Consent Given By: the patient  Position:  PRONE  Additional Comments: Vital signs were monitored before and after the procedure. Patient was prepped and draped in the usual sterile fashion. The correct patient, procedure, and site was verified.   Injection Procedure Details:  Procedure Site One Meds Administered:  Meds ordered this encounter  Medications  . betamethasone acetate-betamethasone sodium phosphate (CELESTONE) injection 12 mg    Laterality: Bilateral  Location/Site:  S1 Foramen   Needle size: 22  ga.  Needle type: Spinal  Needle Placement: Transforaminal  Findings:   -Comments: Excellent flow of contrast along the nerve and into the epidural space.  Procedure Details: After squaring off the sacral end-plate to get a true AP view, the C-arm was positioned so that the best possible view of the S1 foramen was visualized. The soft tissues overlying this structure were infiltrated with 2-3 ml. of 1% Lidocaine without Epinephrine.    The spinal needle was inserted toward the target using a "trajectory" view along the fluoroscope beam.  Under AP and lateral visualization, the needle was advanced so it did not puncture dura. Biplanar projections were used to confirm position. Aspiration was confirmed to be negative for CSF and/or blood. A 1-2 ml. volume of Isovue-250 was injected and flow of contrast was noted at each level. Radiographs were obtained for documentation purposes.   After attaining the desired flow of contrast documented above, a 0.5 to 1.0 ml test dose of 0.25% Marcaine was injected into each respective transforaminal space.  The patient was observed for 90 seconds post injection.  After no sensory deficits were reported, and normal lower extremity motor function was noted,   the above injectate was administered so that equal amounts of the injectate were placed at each foramen (level) into the transforaminal epidural space.   Additional Comments:  The patient tolerated the procedure well Dressing: Band-Aid    Post-procedure details: Patient was observed during the procedure. Post-procedure instructions were reviewed.  Patient left the clinic in stable condition.    Clinical History: MRI LUMBAR SPINE WITHOUT CONTRAST 12/29/2015  TECHNIQUE: Multiplanar,  multisequence MR imaging of the lumbar spine was performed. No intravenous contrast was administered.  COMPARISON:  None.  FINDINGS: Segmentation: 5 non rib-bearing lumbar type vertebral bodies  are present.  Alignment: Slight retrolisthesis is present at L2-3. There is straightening of the normal lumbar lordosis. AP alignment is otherwise anatomic.  Vertebrae: Minimal chronic endplate marrow changes are present at L4-5.  Conus medullaris: Extends to the T12 level and appears normal.  Paraspinal and other soft tissues: A 2.3 cm cyst is present at the lower pole of the left kidney. No other focal lesions are present. There is no significant adenopathy.  Disc levels:  T11-12: A shallow central disc protrusion is present. There is no significant stenosis.  T12-L1: A superior disc extrusion extends 15 mm above the inferior endplate of T12. This partially effaces the ventral CSF. The foramina are patent bilaterally.  L1-2: A leftward broad-based disc protrusion is present. Mild facet hypertrophy is noted. This results in mild left central and foraminal narrowing.  L2-3: A broad-based disc protrusion is present. Moderate facet hypertrophy is evident. Moderate subarticular narrowing is noted bilaterally. Mild foraminal narrowing is present bilaterally as well.  L3-4: A broad-based disc protrusion is present. Mild facet hypertrophy is noted. Moderate left and mild right subarticular narrowing is present. Moderate left and mild right foraminal narrowing is present.  L4-5: A left laminectomy is noted. Residual moderate left and mild right subarticular narrowing is present. Moderate left and mild right foraminal stenosis is stable.  L5-S1: A prominent central disc protrusion is again noted. There is moderate subarticular and bilateral foraminal narrowing without significant interval change.  IMPRESSION: 1. 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 2. Superior disc extrusion at T12-L1 extends 15 mm above the inferior endplate of T12. 3. Mild left central and foraminal stenosis at L1-2. 4. Moderate subarticular and mild foraminal  narrowing bilaterally at L2-3. 5. Moderate left and mild right subarticular and foraminal narrowing bilaterally at L3-4 and L4-5 is stable. 6. Moderate subarticular and bilateral foraminal stenosis at L5-S1 without significant interval change.   He reports that he quit smoking about 19 years ago. His smoking use included cigarettes, pipe, and cigars. He has a 100.00 pack-year smoking history. He has never used smokeless tobacco.  Recent Labs    09/03/17 0259 02/17/18  HGBA1C 9.3* 8.2    Objective:  VS:  HT:    WT:   BMI:     BP:(!) 152/69  HR:(!) 55bpm  TEMP:98.3 F (36.8 C)(Oral)  RESP:97 % Physical Exam  Ortho Exam Imaging: No results found.  Past Medical/Family/Surgical/Social History: Medications & Allergies reviewed per EMR, new medications updated. Patient Active Problem List   Diagnosis Date Noted  . AKI (acute kidney injury) (HCC) 02/12/2018  . Thrombocytopenia (HCC) 01/22/2018  . Symptomatic anemia 01/20/2018  . Dizziness 01/18/2018  . Chronic respiratory failure with hypoxia (HCC) 12/01/2017  . Lung mass 12/01/2017  . PAF (paroxysmal atrial fibrillation) (HCC)   . Ischemic cardiomyopathy 09/02/2017  . Chronic systolic CHF (congestive heart failure) (HCC) 09/02/2017  . Aortic aneurysm (HCC) 09/02/2017  . Diabetes mellitus with complication, with long-term current use of insulin (HCC) 09/01/2017  . Anxiety and depression 09/01/2017  . COPD (chronic obstructive pulmonary disease) (HCC) 09/01/2017  . Hyperlipidemia 01/05/2014  . OSA on CPAP 06/10/2011  . Coronary atherosclerosis 11/23/2009  . Hypothyroidism 07/30/2009  . Essential hypertension 07/30/2009   Past Medical History:  Diagnosis Date  . Anxiety   . Aortic aneurysm (HCC) 09/02/2017  .  BENIGN PROSTATIC HYPERTROPHY 11/23/2009  . Cardiomyopathy (HCC) 08/28/2016  . Chronic systolic CHF (congestive heart failure) (HCC) 09/02/2017  . COPD (chronic obstructive pulmonary disease) (HCC)   . CORONARY ARTERY  DISEASE 11/23/2009  . 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 DEGENERATIVE JOINT DISEASE 11/23/2009  . DEPRESSION 11/23/2009  . FATIGUE 11/23/2009  . GAIT DISTURBANCE 12/10/2009  . HEMOPTYSIS UNSPECIFIED 05/07/2010  . High cholesterol   . HYPERTENSION 07/30/2009  . HYPOTHYROIDISM 07/30/2009  . Ischemic cardiomyopathy 09/02/2017  . 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 On home oxygen therapy    "2-3L; 24/7" (09/10/2016)  . OSA on CPAP   . PTSD (post-traumatic stress disorder) 03/10/2012  . 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 RA (rheumatoid arthritis) (HCC) 06/11/2011   "qwhere" (09/10/2016)  . RESPIRATORY FAILURE, CHRONIC 07/31/2009  . Scleritis of both eyes 03/17/2014  . 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 Type II diabetes mellitus (HCC)    Family History  Problem Relation Age of Onset  . Other Mother        gun shot   Past Surgical History:  Procedure Laterality Date  . 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 ABDOMINAL EXPLORATION SURGERY  02/2004   w/LOA/notes 12/10/2010; small bowel obstruction repair with adhesiolysis   . BACK SURGERY    . CARDIAC CATHETERIZATION     2 heart caths in the past.  One in 2000s showed one ulcerated plaque  Rx medically; Second at [facility] [name] 09/05/2016  . CATARACT EXTRACTION W/ INTRAOCULAR LENS  IMPLANT, BILATERAL Bilateral 2000s  . COLECTOMY     hx of remote ileum resection due to bleeding  . COLONOSCOPY WITH PROPOFOL N/A 01/22/2018   Procedure: COLONOSCOPY WITH PROPOFOL;  Surgeon: Iva Boop, MD;  Location: [facility] ENDOSCOPY;  Service: Endoscopy;  Laterality: N/A;  . CORONARY ANGIOPLASTY WITH STENT PLACEMENT  09/10/2016  . CORONARY STENT INTERVENTION N/A 09/10/2016   Procedure: Coronary Stent Intervention;  Surgeon: Kathleene Hazel, MD;  Location: [facility] INVASIVE CV LAB;  Service: Cardiovascular;  Laterality: N/A;  Distal RCA 4.0x16 Synergy  . ESOPHAGOGASTRODUODENOSCOPY (EGD) WITH PROPOFOL N/A 01/22/2018   Procedure: ESOPHAGOGASTRODUODENOSCOPY  (EGD) WITH PROPOFOL;  Surgeon: Iva Boop, MD;  Location: [facility] ENDOSCOPY;  Service: Endoscopy;  Laterality: N/A;  . FEMORAL EMBOLOECTOMY Left 07/2000   with left leg ischemia; Dr. Hart Rochester, vascular  . GANGLION CYST EXCISION Right    "wrist"; Dr. Teressa Senter  . HOT HEMOSTASIS N/A 01/22/2018   Procedure: HOT HEMOSTASIS (ARGON PLASMA COAGULATION/BICAP);  Surgeon: Iva Boop, MD;  Location: [facility];  Service: Endoscopy;  Laterality: N/A;  . LUMBAR LAMINECTOMY  1972   Dr. Fannie Knee  . RIGHT/LEFT HEART CATH AND CORONARY ANGIOGRAPHY N/A 09/10/2016   Procedure: Right/Left Heart Cath and Coronary Angiography;  Surgeon: Kathleene Hazel, MD;  Location: [facility] INVASIVE CV LAB;  Service: Cardiovascular;  Laterality: N/A;  . TONSILLECTOMY     Social History   Occupational History  . Occupation: disabled Cytogeneticist, Ex Neurosurgeon: RETIRED  Tobacco Use  . Smoking status: Former Smoker    Packs/day: 2.50    Years: 40.00    Pack years: 100.00    Types: Cigarettes, Pipe, Cigars    Last attempt to quit: 07/28/1998    Years since quitting: 19.6  . Smokeless tobacco: Never Used  Substance and Sexual Activity  . Alcohol use: No    Alcohol/week: 0.0 oz  . Drug use: No  . Sexual activity: Not on file

## 2018-03-08 NOTE — Progress Notes (Signed)
HPI: FU CAD.Cardiac cath 2/18 showed 30 LM, 50 LAD, aneurysmal RCA with 80 mid to distal; had DES to RCA at that time. Patient followed by pulmonary for history of pulmonary nodule.  Last echo 2/19 showed EF 40-45, grade 2 DD, mild RAE. Carotid dopplers 2/19 showed no significant stenosis. Admitted with sepsis 2/19 and also with atrial fibrillation, converted spontaneously to sinus; TSH low and synthroid adjusted. Placed on amiodarone.  Amiodarone discontinued at previous office visit.  Monitor performed May 2019 showed no significant arrhythmia.  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  The left internal iliac was occluded area right and left common femoral artery aneurysms 1.8 and 1.7 cm.  There were numerous mesenteric and retroperitoneal lymph nodes increased in lymphoproliferative disorder could not be excluded.  Since last seen,patient does have some dyspnea on exertion but no orthopnea, PND, pedal edema, chest pain or syncope.  Current Outpatient Medications  Medication Sig Dispense Refill  . albuterol (PROVENTIL) (2.5 MG/3ML) 0.083% nebulizer solution 1 vial in nebulizer every 6 hours as needed Dx 496 (Patient taking differently: Take 2.5 mg by nebulization every 6 (six) hours as needed for wheezing or shortness of breath. 1 vial in nebulizer every 6 hours as needed Dx 496) 120 mL 6  . ALPRAZolam (XANAX) 0.5 MG tablet Take 0.5 mg by mouth at bedtime.     [name] amiodarone (PACERONE) 100 MG tablet Take 1 tablet (100 mg total) by mouth daily. 30 tablet 0  . apixaban (ELIQUIS) 5 MG TABS tablet Take 1 tablet (5 mg total) by mouth 2 (two) times daily. 180 tablet 3  . aspirin EC 81 MG tablet Take 1 tablet (81 mg total) by mouth daily.    [name] atorvastatin (LIPITOR) 40 MG tablet Take 1 tablet (40 mg total) by mouth daily. 90 tablet 3  . Carboxymethylcellul-Glycerin (LUBRICATING EYE  DROPS OP) Apply 1 drop to eye daily as needed (dry eyes).    . carvedilol (COREG) 3.125 MG tablet Take 1 tablet (3.125 mg total) by mouth daily. 180 tablet 0  . Cholecalciferol (VITAMIN D3) 2000 units capsule Take 2,000 Units by mouth daily.     . insulin glargine (LANTUS) 100 UNIT/ML injection Inject 0.4 mLs (40 Units total) into the skin at bedtime. (Patient taking differently: Inject 45 Units into the skin at bedtime. ) 10 mL 0  . levothyroxine (SYNTHROID, LEVOTHROID) 125 MCG tablet Take 1.5 tablets (188 mcg total) by mouth daily before breakfast. (Patient taking differently: Take 125 mcg by mouth daily before breakfast. ) 60 tablet 0  . metFORMIN (GLUCOPHAGE) 500 MG tablet Take 500 mg by mouth 2 (two) times daily with a meal.    . Multiple Vitamins-Minerals (MEGA MULTIVITAMIN FOR MEN PO) Take 1 tablet by mouth daily.     . OXYGEN Inhale 2 L into the lungs continuous.    . pregabalin (LYRICA) 50 MG capsule Take 50 mg by mouth 2 (two) times daily.    . Semaglutide (OZEMPIC) 1 MG/DOSE SOPN Inject 1 mg into the skin once a week.    . tamsulosin (FLOMAX) 0.4 MG CAPS capsule Take 1 capsule (0.4 mg total) by mouth at bedtime. 30 capsule 0  . tiotropium (SPIRIVA HANDIHALER) 18 MCG inhalation capsule Place 1 capsule (18 mcg total) into inhaler and inhale daily. 30 capsule 4   No current facility-administered medications for this  visit.      Past Medical History:  Diagnosis Date  . Anxiety   . Aortic aneurysm (HCC) 09/02/2017  . BENIGN PROSTATIC HYPERTROPHY 11/23/2009  . Cardiomyopathy (HCC) 08/28/2016  . Chronic systolic CHF (congestive heart failure) (HCC) 09/02/2017  . COPD (chronic obstructive pulmonary disease) (HCC)   . CORONARY ARTERY DISEASE 11/23/2009  . 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 DEGENERATIVE JOINT DISEASE 11/23/2009  . DEPRESSION 11/23/2009  . FATIGUE 11/23/2009  . GAIT DISTURBANCE 12/10/2009  . HEMOPTYSIS UNSPECIFIED 05/07/2010  . High cholesterol   . HYPERTENSION 07/30/2009  .  HYPOTHYROIDISM 07/30/2009  . Ischemic cardiomyopathy 09/02/2017  . 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 On home oxygen therapy    "2-3L; 24/7" (09/10/2016)  . OSA on CPAP   . PTSD (post-traumatic stress disorder) 03/10/2012  . 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 RA (rheumatoid arthritis) (HCC) 06/11/2011   "qwhere" (09/10/2016)  . RESPIRATORY FAILURE, CHRONIC 07/31/2009  . Scleritis of both eyes 03/17/2014  . 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 Type II diabetes mellitus (HCC)     Past Surgical History:  Procedure Laterality Date  . 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 ABDOMINAL EXPLORATION SURGERY  02/2004   w/LOA/notes 12/10/2010; small bowel obstruction repair with adhesiolysis   . BACK SURGERY    . CARDIAC CATHETERIZATION     2 heart caths in the past.  One in 2000s showed one ulcerated plaque  Rx medically; Second at [facility] [name] 09/05/2016  . CATARACT EXTRACTION W/ INTRAOCULAR LENS  IMPLANT, BILATERAL Bilateral 2000s  . COLECTOMY     hx of remote ileum resection due to bleeding  . COLONOSCOPY WITH PROPOFOL N/A 01/22/2018   Procedure: COLONOSCOPY WITH PROPOFOL;  Surgeon: Iva Boop, MD;  Location: [facility];  Service: Endoscopy;  Laterality: N/A;  . CORONARY ANGIOPLASTY WITH STENT PLACEMENT  09/10/2016  . CORONARY STENT INTERVENTION N/A 09/10/2016   Procedure: Coronary Stent Intervention;  Surgeon: Kathleene Hazel, MD;  Location: [facility] INVASIVE CV LAB;  Service: Cardiovascular;  Laterality: N/A;  Distal RCA 4.0x16 Synergy  . ESOPHAGOGASTRODUODENOSCOPY (EGD) WITH PROPOFOL N/A 01/22/2018   Procedure: ESOPHAGOGASTRODUODENOSCOPY (EGD) WITH PROPOFOL;  Surgeon: Iva Boop, MD;  Location: [facility];  Service: Endoscopy;  Laterality: N/A;  . FEMORAL EMBOLOECTOMY Left 07/2000   with left leg ischemia; Dr. Hart Rochester, vascular  . GANGLION CYST EXCISION Right    "wrist"; Dr. Teressa Senter  . HOT HEMOSTASIS N/A 01/22/2018   Procedure: HOT HEMOSTASIS (ARGON PLASMA COAGULATION/BICAP);   Surgeon: Iva Boop, MD;  Location: [facility];  Service: Endoscopy;  Laterality: N/A;  . LUMBAR LAMINECTOMY  1972   Dr. Fannie Knee  . RIGHT/LEFT HEART CATH AND CORONARY ANGIOGRAPHY N/A 09/10/2016   Procedure: Right/Left Heart Cath and Coronary Angiography;  Surgeon: Kathleene Hazel, MD;  Location: [facility] INVASIVE CV LAB;  Service: Cardiovascular;  Laterality: N/A;  . TONSILLECTOMY      Social History   Socioeconomic History  . Marital status: Married    Spouse name: Not on file  . Number of children: Not on file  . Years of education: Not on file  . Highest education level: Not on file  Occupational History  . Occupation: disabled Cytogeneticist, Ex Neurosurgeon: RETIRED  Social Needs  . Financial resource strain: Not on file  . Food insecurity:    Worry: Not on file    Inability: Not on file  . Transportation needs:    Medical: Not on file  Non-medical: Not on file  Tobacco Use  . Smoking status: Former Smoker    Packs/day: 2.50    Years: 40.00    Pack years: 100.00    Types: Cigarettes, Pipe, Cigars    Last attempt to quit: 07/28/1998    Years since quitting: 19.6  . Smokeless tobacco: Never Used  Substance and Sexual Activity  . Alcohol use: No    Alcohol/week: 0.0 standard drinks  . Drug use: No  . Sexual activity: Not on file  Lifestyle  . Physical activity:    Days per week: Not on file    Minutes per session: Not on file  . Stress: Not on file  Relationships  . Social connections:    Talks on phone: Not on file    Gets together: Not on file    Attends religious service: Not on file    Active member of club or organization: Not on file    Attends meetings of clubs or organizations: Not on file    Relationship status: Not on file  . Intimate partner violence:    Fear of current or ex partner: Not on file    Emotionally abused: Not on file    Physically abused: Not on file    Forced sexual activity: Not on file  Other Topics Concern  . Not on  file  Social History Narrative   Lives in the home with his wife   Sees [place] every 6 month for meds.   Denies asbestos exposure    Family History  Problem Relation Age of Onset  . Other Mother        gun shot    ROS: no fevers or chills, productive cough, hemoptysis, dysphasia, odynophagia, melena, hematochezia, dysuria, hematuria, rash, seizure activity, orthopnea, PND, pedal edema, claudication. Remaining systems are negative.  Physical Exam: Well-developed well-nourished in no acute distress.  Chronically ill-appearing Skin is warm and dry.  HEENT is normal.  Neck is supple.  Chest with diminished breath sounds throughout Cardiovascular exam is regular rate and rhythm.  Abdominal exam nontender or distended. No masses palpated. Extremities show trace edema. neuro grossly intact  A/P  1 coronary artery disease-patient is doing well with no chest pain.  Continue medical therapy with statin.  Discontinue aspirin given need for apixaban.  2 paroxysmal atrial fibrillation-patient continues to be in sinus rhythm today on exam.  Continue beta-blocker for rate control if atrial fibrillation recurs.  Patient is still on amiodarone and I will discontinue this to see if he will maintain sinus rhythm.  I would like to avoid amiodarone particularly in light of severe COPD.  Previous episode of atrial fibrillation occurred in the setting of urosepsis.  Continue apixaban.  3 thoracic aortic aneurysm/abdominal aortic aneurysm/renal artery aneurysm/iliac aneurysm-patient will need follow-up CTA July 2020.  4 enlarged lymph nodes-patient will need evaluation by oncology.  5 cardiomyopathy-continue beta-blocker.  Blood pressure borderline and I will therefore not add ARB.  6 hypertension-lead pressure is controlled.  Continue present medications.  7 hyperlipidemia-continue statin.  8 chronic combined systolic/diastolic congestive heart failure-patient is euvolemic on examination today.  We  will continue with low-sodium diet and fluid restriction.  9 history of near syncope-follow-up monitor showed no significant arrhythmia.  Olga Millers, MD

## 2018-03-10 ENCOUNTER — Encounter: Payer: Self-pay | Admitting: Cardiology

## 2018-03-10 ENCOUNTER — Ambulatory Visit: Payer: Medicare Other | Admitting: Cardiology

## 2018-03-10 VITALS — BP 94/48 | HR 66 | Ht 73.0 in | Wt 204.8 lb

## 2018-03-10 DIAGNOSIS — I480 Paroxysmal atrial fibrillation: Secondary | ICD-10-CM

## 2018-03-10 DIAGNOSIS — I714 Abdominal aortic aneurysm, without rupture, unspecified: Secondary | ICD-10-CM

## 2018-03-10 DIAGNOSIS — I712 Thoracic aortic aneurysm, without rupture, unspecified: Secondary | ICD-10-CM

## 2018-03-10 DIAGNOSIS — I2510 Atherosclerotic heart disease of native coronary artery without angina pectoris: Secondary | ICD-10-CM

## 2018-03-10 NOTE — Patient Instructions (Signed)
Medication Instructions:   STOP AMIODARONE  STOP ASPIRIN   Follow-Up:  Your physician wants you to follow-up in: 6 MONTHS WITHDR CRENSHAW You will receive a reminder letter in the mail two months in advance. If you don't receive a letter, please call our office to schedule the follow-up appointment.

## 2018-03-11 ENCOUNTER — Other Ambulatory Visit (INDEPENDENT_AMBULATORY_CARE_PROVIDER_SITE_OTHER): Payer: Medicare Other

## 2018-03-11 ENCOUNTER — Encounter: Payer: Self-pay | Admitting: Adult Health

## 2018-03-11 ENCOUNTER — Ambulatory Visit (INDEPENDENT_AMBULATORY_CARE_PROVIDER_SITE_OTHER): Payer: Medicare Other | Admitting: Adult Health

## 2018-03-11 DIAGNOSIS — J449 Chronic obstructive pulmonary disease, unspecified: Secondary | ICD-10-CM | POA: Diagnosis not present

## 2018-03-11 DIAGNOSIS — I5022 Chronic systolic (congestive) heart failure: Secondary | ICD-10-CM

## 2018-03-11 DIAGNOSIS — J9611 Chronic respiratory failure with hypoxia: Secondary | ICD-10-CM

## 2018-03-11 DIAGNOSIS — R918 Other nonspecific abnormal finding of lung field: Secondary | ICD-10-CM

## 2018-03-11 DIAGNOSIS — N179 Acute kidney failure, unspecified: Secondary | ICD-10-CM

## 2018-03-11 DIAGNOSIS — G4733 Obstructive sleep apnea (adult) (pediatric): Secondary | ICD-10-CM | POA: Diagnosis not present

## 2018-03-11 DIAGNOSIS — Z9989 Dependence on other enabling machines and devices: Secondary | ICD-10-CM

## 2018-03-11 DIAGNOSIS — D649 Anemia, unspecified: Secondary | ICD-10-CM | POA: Diagnosis not present

## 2018-03-11 DIAGNOSIS — R911 Solitary pulmonary nodule: Secondary | ICD-10-CM

## 2018-03-11 LAB — CBC
HEMATOCRIT: 28.8 % — AB (ref 39.0–52.0)
Hemoglobin: 9.5 g/dL — ABNORMAL LOW (ref 13.0–17.0)
MCHC: 32.9 g/dL (ref 30.0–36.0)
MCV: 84.7 fl (ref 78.0–100.0)
PLATELETS: 75 10*3/uL — AB (ref 150.0–400.0)
RBC: 3.4 Mil/uL — ABNORMAL LOW (ref 4.22–5.81)
RDW: 19.3 % — ABNORMAL HIGH (ref 11.5–15.5)
WBC: 3 10*3/uL — ABNORMAL LOW (ref 4.0–10.5)

## 2018-03-11 LAB — COMPREHENSIVE METABOLIC PANEL
ALT: 18 U/L (ref 0–53)
AST: 23 U/L (ref 0–37)
Albumin: 3.6 g/dL (ref 3.5–5.2)
Alkaline Phosphatase: 133 U/L — ABNORMAL HIGH (ref 39–117)
BUN: 18 mg/dL (ref 6–23)
CHLORIDE: 106 meq/L (ref 96–112)
CO2: 28 meq/L (ref 19–32)
Calcium: 8.8 mg/dL (ref 8.4–10.5)
Creatinine, Ser: 1.7 mg/dL — ABNORMAL HIGH (ref 0.40–1.50)
GFR: 41.8 mL/min — AB (ref >60.00)
GLUCOSE: 156 mg/dL — AB (ref 70–99)
POTASSIUM: 4.5 meq/L (ref 3.5–5.1)
SODIUM: 139 meq/L (ref 135–145)
Total Bilirubin: 0.7 mg/dL (ref 0.2–1.2)
Total Protein: 6 g/dL (ref 6.0–8.3)

## 2018-03-11 NOTE — Assessment & Plan Note (Signed)
Currently stable on present regimen will call back to notify her of additional inhaler is taken at home  Plan  Patient Instructions  Continue on Spiriva daily.  Call when you get home and let us know the name of your other inhaler you are taking.  Continue on Oxygen 2l/m .  Continue on CPAP At bedtime  With oxygen .  CT chest next month as planned.  Follow up in 3 months with Parrett NP in [facility] clinic  Please contact office for sooner follow up if symptoms do not improve or worsen or seek emergency care

## 2018-03-11 NOTE — Assessment & Plan Note (Signed)
Appears compensated without evidence of vol overload on exam

## 2018-03-11 NOTE — Assessment & Plan Note (Signed)
Abn CT chest with left sided nodularity , imrproving on CT chest 08/2017 , recent CT chest 01/2018 showed stable lung nodules  Would repeat in 6 months and As needed

## 2018-03-11 NOTE — Assessment & Plan Note (Signed)
Doing well on CPAP.  No changes 

## 2018-03-11 NOTE — Patient Instructions (Addendum)
Continue on Spiriva daily.  Call when you get home and let us know the name of your other inhaler you are taking.  Continue on Oxygen 2l/m .  Continue on CPAP At bedtime  With oxygen .  CT chest in January 2020  Follow up in 3 months with Rachel Samples NP in [facility] clinic  Please contact office for sooner follow up if symptoms do not improve or worsen or seek emergency care

## 2018-03-11 NOTE — Assessment & Plan Note (Signed)
Cont on O2 .  

## 2018-03-11 NOTE — Progress Notes (Signed)
 @Patient ID: [name], male    DOB: 11/15/1940, 77 y.o.   MRN: [id number]  Chief Complaint  Patient presents with  . 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  PFT 10/2015 FEV1 was 36%, ratio 46, FVC 58%, DLCO 26% CXR 11/2016 LUL PNA ,  CT chest 12/2016 -masslike consolidation in LUL  CT chest 03/12/17 >stable masslike consolidation LUL 3.9cm , LUL 7 mm nodule  PET scan 03/20/17 >+metabolic act in LUL consolidation CT chest February 2019> nodular left-sided airspace opacities have improved from prior study.  CT angio chest January 25, 2018> advanced emphysema, scarring of the left upper lobe, nodules of the left lower lobe are unchanged.  Scattered scarring in the right lung is unchanged.   03/11/2018 Follow up : COPD , O2 RF , OSA , lung nodules Patient presents for a 3-month follow-up.  he has underlying severe COPD that is oxygen dependent.  Says overall that breathing has actually been doing better.  He has had no flare of his cough or wheezing.  Shortness of breath has been stable.  He does get winded with minimal activity but is quite active despite being on oxygen.  He is a caregiver for his wife who is very debilitated from COPD and is on hospice at home. Patient remains on Spiriva.  Patient does state that he takes another inhaler at night but were unclear which inhaler this is.  [facility]  He remains on oxygen 2 L.  Says he is doing well with this.  Couple of admissions this year , Sepsis 08/2017 and GIB 12/2017 . Says since last admit has been doing better.  Energy level is picking back up . No flare of cough , or edema.  Is following with Hematology for chronic anemia , not a candidate for bone marrow biopsy.  Recent labs showed stable hbg. Hematology notes reviewed .   Patient has had abnormal CT chest with serial follow-up.  CT chest in February showed improved nodular left-sided opacities.  Patient had a follow-up CT from a cardiology following and it aortic aneurysm in July 2019 that showed advanced emphysema with stable scarring and lung nodules.   Allergies  Allergen Reactions  . No Known Allergies     Immunization History  Administered Date(s) Administered  . Influenza Split 04/14/2011, 04/27/2013  . Influenza Whole 05/28/2009, 04/27/2010, 03/31/2012  . Influenza, High Dose Seasonal PF 03/21/2015, 05/16/2017  . Influenza,inj,Quad PF,6+ Mos 03/17/2014, 04/04/2016  . Influenza-Unspecified 05/26/2011, 05/07/2012  . Pneumococcal Conjugate-13 09/20/2013  . Pneumococcal Polysaccharide-23 05/28/2008, 04/25/2010  . Pneumococcal-Unspecified 04/27/2013  . Td 11/23/2009  . Zoster 04/28/2009    Past Medical History:  Diagnosis Date  . Anxiety   . Aortic aneurysm (HCC) 09/02/2017  . BENIGN PROSTATIC HYPERTROPHY 11/23/2009  . Cardiomyopathy (HCC) 08/28/2016  . Chronic systolic CHF (congestive heart failure) (HCC) 09/02/2017  . COPD (chronic obstructive pulmonary disease) (HCC)   . CORONARY ARTERY DISEASE 11/23/2009  . 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 DEGENERATIVE JOINT DISEASE 11/23/2009  . DEPRESSION 11/23/2009  . FATIGUE 11/23/2009  .   GAIT DISTURBANCE 12/10/2009  . HEMOPTYSIS UNSPECIFIED 05/07/2010  . High cholesterol   . HYPERTENSION 07/30/2009  . HYPOTHYROIDISM 07/30/2009  . Ischemic cardiomyopathy 09/02/2017  . 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 On home oxygen therapy    "2-3L; 24/7" (09/10/2016)  . OSA on CPAP   . PTSD (post-traumatic stress disorder) 03/10/2012  . 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 RA (rheumatoid arthritis)  ([place]) 06/11/2011   "qwhere" (09/10/2016)  . RESPIRATORY FAILURE, CHRONIC 07/31/2009  . Scleritis of both eyes 03/17/2014  . 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 Type II diabetes mellitus (HCC)     Tobacco History: Social History   Tobacco Use  Smoking Status Former Smoker  . Packs/day: 2.50  . Years: 40.00  . Pack years: 100.00  . Types: Cigarettes, Pipe, Cigars  . Last attempt to quit: 07/28/1998  . Years since quitting: 19.6  Smokeless Tobacco Never Used   Counseling given: Not Answered   Outpatient Medications Prior to Visit  Medication Sig Dispense Refill  . albuterol (PROVENTIL) (2.5 MG/3ML) 0.083% nebulizer solution 1 vial in nebulizer every 6 hours as needed Dx 496 (Patient taking differently: Take 2.5 mg by nebulization every 6 (six) hours as needed for wheezing or shortness of breath. 1 vial in nebulizer every 6 hours as needed Dx 496) 120 mL 6  . ALPRAZolam (XANAX) 0.5 MG tablet Take 0.5 mg by mouth at bedtime.     [name] apixaban (ELIQUIS) 5 MG TABS tablet Take 1 tablet (5 mg total) by mouth 2 (two) times daily. 180 tablet 3  . atorvastatin (LIPITOR) 40 MG tablet Take 1 tablet (40 mg total) by mouth daily. 90 tablet 3  . Carboxymethylcellul-Glycerin (LUBRICATING EYE DROPS OP) Apply 1 drop to eye daily as needed (dry eyes).    . carvedilol (COREG) 3.125 MG tablet Take 1 tablet (3.125 mg total) by mouth daily. 180 tablet 0  . Cholecalciferol (VITAMIN D3) 2000 units capsule Take 2,000 Units by mouth daily.     . insulin glargine (LANTUS) 100 UNIT/ML injection Inject 0.4 mLs (40 Units total) into the skin at bedtime. (Patient taking differently: Inject 45 Units into the skin at bedtime. ) 10 mL 0  . levothyroxine (SYNTHROID, LEVOTHROID) 125 MCG tablet Take 1.5 tablets (188 mcg total) by mouth daily before breakfast. (Patient taking differently: Take 125 mcg by mouth daily before breakfast. ) 60 tablet 0  . metFORMIN (GLUCOPHAGE) 500 MG tablet Take 500 mg by mouth 2 (two) times daily with a meal.      . Multiple Vitamins-Minerals (MEGA MULTIVITAMIN FOR MEN PO) Take 1 tablet by mouth daily.     . OXYGEN Inhale 2 L into the lungs continuous.    . pregabalin (LYRICA) 50 MG capsule Take 50 mg by mouth 2 (two) times daily.    . Semaglutide (OZEMPIC) 1 MG/DOSE SOPN Inject 1 mg into the skin once a week.    . tamsulosin (FLOMAX) 0.4 MG CAPS capsule Take 1 capsule (0.4 mg total) by mouth at bedtime. 30 capsule 0  . tiotropium (SPIRIVA HANDIHALER) 18 MCG inhalation capsule Place 1 capsule (18 mcg total) into inhaler and inhale daily. 30 capsule 4   No facility-administered medications prior to visit.      Review of Systems  Constitutional:   No  weight loss, night sweats,  Fevers, chills, + fatigue, or  lassitude.  HEENT:   No headaches,  Difficulty swallowing,  Tooth/dental problems, or  Sore throat,  No sneezing, itching, ear ache, nasal congestion, post nasal drip,   CV:  No chest pain,  Orthopnea, PND, swelling in lower extremities, anasarca, dizziness, palpitations, syncope.   GI  No heartburn, indigestion, abdominal pain, nausea, vomiting, diarrhea, change in bowel habits, loss of appetite, bloody stools.   Resp:    No chest wall deformity  Skin: no rash or lesions.  GU: no dysuria, change in color of urine, no urgency or frequency.  No flank pain, no hematuria   MS:  No joint pain or swelling.  No decreased range of motion.  No back pain.    Physical Exam  BP 122/70 (BP Location: Left Arm, Cuff Size: Normal)   Pulse 74   Ht 6' 1" (1.854 m)   Wt 206 lb 1.9 oz (93.5 kg)   SpO2 93%   BMI 27.19 kg/m   GEN: A/Ox3; pleasant , NAD , elderly , O2    HEENT:  Pink/AT,  EACs-clear, TMs-wnl, NOSE-clear, THROAT-clear, no lesions, no postnasal drip or exudate noted.   NECK:  Supple w/ fair ROM; no JVD; normal carotid impulses w/o bruits; no thyromegaly or nodules palpated; no lymphadenopathy.    RESP  Decreased BS in bases ,  no accessory muscle use, no dullness to  percussion  CARD:  RRR, no m/r/g, no peripheral edema, pulses intact, no cyanosis or clubbing.  GI:   Soft & nt; nml bowel sounds; no organomegaly or masses detected.   Musco: Warm bil, no deformities or joint swelling noted.   Neuro: alert, no focal deficits noted.    Skin: Warm, no lesions or rashes    Lab Results:  CBC  BMET  BNP No results found for: BNP  ProBNP    Assessment & Plan:   COPD (chronic obstructive pulmonary disease) (HCC) Currently stable on present regimen will call back to notify her of additional inhaler is taken at home  Plan  Patient Instructions  Continue on Spiriva daily.  Call when you get home and let us know the name of your other inhaler you are taking.  Continue on Oxygen 2l/m .  Continue on CPAP At bedtime  With oxygen .  CT chest next month as planned.  Follow up in 3 months with Parrett NP in [facility] clinic  Please contact office for sooner follow up if symptoms do not improve or worsen or seek emergency care       Chronic respiratory failure with hypoxia ([place]) Cont on O2   OSA on CPAP Doing well on CPAP  No changes   Chronic systolic CHF (congestive heart failure) ([place]) Appears compensated without evidence of vol overload on exam   Lung mass Abn CT chest with left sided nodularity , imrproving on CT chest 08/2017 , recent CT chest 01/2018 showed stable lung nodules  Would repeat in 6 months and As needed       Rexene Edison, NP 03/11/2018

## 2018-03-12 ENCOUNTER — Telehealth: Payer: Self-pay | Admitting: Adult Health

## 2018-03-12 ENCOUNTER — Telehealth: Payer: Self-pay

## 2018-03-12 NOTE — Telephone Encounter (Signed)
Returned pt's call from patient.  He recently saw his PCP and said she would him to follow up with Dr Pamelia Hoit sooner than November and  to review his most recent labs from their office, results in Epic.  Made patient appt for this Monday, August 19th at 10:15 am. No other needs per pt at this time.

## 2018-03-12 NOTE — Telephone Encounter (Signed)
Called and spoke with patient, advised him that per TP she wants to cancel the CT scan for next month and schedule it for 6 months from now. This has been cancelled and new order placed. Patient is aware. Nothing further needed.

## 2018-03-15 ENCOUNTER — Inpatient Hospital Stay: Payer: Medicare Other | Admitting: Hematology and Oncology

## 2018-03-15 ENCOUNTER — Telehealth: Payer: Self-pay | Admitting: Hematology and Oncology

## 2018-03-15 DIAGNOSIS — D631 Anemia in chronic kidney disease: Secondary | ICD-10-CM

## 2018-03-15 DIAGNOSIS — D696 Thrombocytopenia, unspecified: Secondary | ICD-10-CM

## 2018-03-15 DIAGNOSIS — Z7901 Long term (current) use of anticoagulants: Secondary | ICD-10-CM

## 2018-03-15 DIAGNOSIS — D72819 Decreased white blood cell count, unspecified: Secondary | ICD-10-CM | POA: Diagnosis not present

## 2018-03-15 DIAGNOSIS — E1122 Type 2 diabetes mellitus with diabetic chronic kidney disease: Secondary | ICD-10-CM

## 2018-03-15 DIAGNOSIS — Z79899 Other long term (current) drug therapy: Secondary | ICD-10-CM

## 2018-03-15 DIAGNOSIS — D649 Anemia, unspecified: Secondary | ICD-10-CM

## 2018-03-15 DIAGNOSIS — Z794 Long term (current) use of insulin: Secondary | ICD-10-CM

## 2018-03-15 DIAGNOSIS — R0602 Shortness of breath: Secondary | ICD-10-CM

## 2018-03-15 DIAGNOSIS — N189 Chronic kidney disease, unspecified: Secondary | ICD-10-CM | POA: Diagnosis not present

## 2018-03-15 DIAGNOSIS — I129 Hypertensive chronic kidney disease with stage 1 through stage 4 chronic kidney disease, or unspecified chronic kidney disease: Secondary | ICD-10-CM

## 2018-03-15 NOTE — Telephone Encounter (Signed)
Gave patient avs and calendar.   °

## 2018-03-15 NOTE — Assessment & Plan Note (Signed)
Attributed to anemia chronic kidney disease, ideally we would need to do a bone marrow biopsy but because of his multiple health issues, supportive care only.  Lab review: 02/18/2018: Hemoglobin 8.6, MCV 83, WBC 3.5, platelets 86 creatinine 1.83 02/26/2018: Hemoglobin 9.1, platelets 92, MCV 85 03/11/2018: Hemoglobin 9.5, platelets 75, MCV 84, WBC 3  I reviewed the lab work with the patient and suggested that we do the bone marrow biopsy to rule out serious bone marrow disorders like leukemia's and MDS.

## 2018-03-15 NOTE — Progress Notes (Signed)
Patient Care Team: [name], MD as PCP - General (Internal Medicine) Clent Jacks, MD as Consulting Physician (Ophthalmology) Brand Males, MD as Consulting Physician (Pulmonary Disease) System, Provider Not In as Consulting Physician Lahoma Rocker, MD as Consulting Physician (Rheumatology) Rigoberto Noel, MD as Consulting Physician (Pulmonary Disease) Lelon Perla, MD as Consulting Physician (Cardiology)  DIAGNOSIS:  Encounter Diagnosis  Name Primary?  . Symptomatic anemia    CHIEF COMPLIANT: Follow-up of anemia  INTERVAL HISTORY: [name] is a 77 year old with multiple medical issues who has had long-standing anemia.  Initial blood work did not show any clear-cut etiology.  I recommended a bone marrow biopsy which she wants to hold off at that time.  He was seen by his primary care physician who performed blood work and recommended that he follow-up with [place] sooner than 3 months.  This is the reason why he is here today.  He reports no new symptoms or changes in his complaints.  He still feels short of breath and uses oxygen by nasal cannula.  He does have chronic kidney disease.  REVIEW OF SYSTEMS:   Constitutional: Denies fevers, chills or abnormal weight loss Eyes: Denies blurriness of vision Ears, nose, mouth, throat, and face: Denies mucositis or sore throat Respiratory: Denies cough, dyspnea or wheezes Cardiovascular: Denies palpitation, chest discomfort Gastrointestinal:  Denies nausea, heartburn or change in bowel habits Skin: Denies abnormal skin rashes Lymphatics: Denies new lymphadenopathy or easy bruising Neurological:Denies numbness, tingling or new weaknesses Behavioral/Psych: Mood is stable, no new changes  Extremities: No lower extremity edema   All other systems were reviewed with the patient and are negative.  I have reviewed the past medical history, past surgical history, social history and family history with the patient  and they are unchanged from previous note.  ALLERGIES:  is allergic to no known allergies.  MEDICATIONS:  Current Outpatient Medications  Medication Sig Dispense Refill  . albuterol (PROVENTIL) (2.5 MG/3ML) 0.083% nebulizer solution 1 vial in nebulizer every 6 hours as needed Dx 496 (Patient taking differently: Take 2.5 mg by nebulization every 6 (six) hours as needed for wheezing or shortness of breath. 1 vial in nebulizer every 6 hours as needed Dx 496) 120 mL 6  . ALPRAZolam (XANAX) 0.5 MG tablet Take 0.5 mg by mouth at bedtime.     [name] apixaban (ELIQUIS) 5 MG TABS tablet Take 1 tablet (5 mg total) by mouth 2 (two) times daily. 180 tablet 3  . atorvastatin (LIPITOR) 40 MG tablet Take 1 tablet (40 mg total) by mouth daily. 90 tablet 3  . Carboxymethylcellul-Glycerin (LUBRICATING EYE DROPS OP) Apply 1 drop to eye daily as needed (dry eyes).    . carvedilol (COREG) 3.125 MG tablet Take 1 tablet (3.125 mg total) by mouth daily. 180 tablet 0  . Cholecalciferol (VITAMIN D3) 2000 units capsule Take 2,000 Units by mouth daily.     . insulin glargine (LANTUS) 100 UNIT/ML injection Inject 0.4 mLs (40 Units total) into the skin at bedtime. (Patient taking differently: Inject 45 Units into the skin at bedtime. ) 10 mL 0  . levothyroxine (SYNTHROID, LEVOTHROID) 125 MCG tablet Take 1.5 tablets (188 mcg total) by mouth daily before breakfast. (Patient taking differently: Take 125 mcg by mouth daily before breakfast. ) 60 tablet 0  . metFORMIN (GLUCOPHAGE) 500 MG tablet Take 500 mg by mouth 2 (two) times daily with a meal.    . Multiple Vitamins-Minerals (MEGA MULTIVITAMIN FOR MEN PO) Take 1 tablet  by mouth daily.     . OXYGEN Inhale 2 L into the lungs continuous.    . pregabalin (LYRICA) 50 MG capsule Take 50 mg by mouth 2 (two) times daily.    . Semaglutide (OZEMPIC) 1 MG/DOSE SOPN Inject 1 mg into the skin once a week.    . tamsulosin (FLOMAX) 0.4 MG CAPS capsule Take 1 capsule (0.4 mg total) by mouth at  bedtime. 30 capsule 0  . tiotropium (SPIRIVA HANDIHALER) 18 MCG inhalation capsule Place 1 capsule (18 mcg total) into inhaler and inhale daily. 30 capsule 4   No current facility-administered medications for this visit.     PHYSICAL EXAMINATION: ECOG PERFORMANCE STATUS: 1 - Symptomatic but completely ambulatory  Vitals:   03/15/18 1030  BP: (!) 143/62  Pulse: 66  Resp: 18  Temp: 98.4 F (36.9 C)  SpO2: 96%   Filed Weights   03/15/18 1030  Weight: 204 lb 12.8 oz (92.9 kg)    GENERAL:alert, no distress and comfortable SKIN: skin color, texture, turgor are normal, no rashes or significant lesions EYES: normal, Conjunctiva are pink and non-injected, sclera clear OROPHARYNX:no exudate, no erythema and lips, buccal mucosa, and tongue normal  NECK: supple, thyroid normal size, non-tender, without nodularity LYMPH:  no palpable lymphadenopathy in the cervical, axillary or inguinal LUNGS: clear to auscultation and percussion with normal breathing effort HEART: regular rate & rhythm and no murmurs and no lower extremity edema ABDOMEN:abdomen soft, non-tender and normal bowel sounds MUSCULOSKELETAL:no cyanosis of digits and no clubbing  NEURO: alert & oriented x 3 with fluent speech, no focal motor/sensory deficits EXTREMITIES: No lower extremity edema   LABORATORY DATA:  I have reviewed the data as listed CMP Latest Ref Rng & Units 03/11/2018 02/18/2018 02/17/2018  Glucose 70 - 99 mg/dL 156(H) 127(H) -  BUN 6 - 23 mg/dL 18 21 -  Creatinine 0.40 - 1.50 mg/dL 1.70(H) 1.83(H) 1.7(A)  Sodium 135 - 145 mEq/L 139 138 139  Potassium 3.5 - 5.1 mEq/L 4.5 4.5 4.9  Chloride 96 - 112 mEq/L 106 103 -  CO2 19 - 32 mEq/L 28 27 -  Calcium 8.4 - 10.5 mg/dL 8.8 8.8 -  Total Protein 6.0 - 8.3 g/dL 6.0 - -  Total Bilirubin 0.2 - 1.2 mg/dL 0.7 - -  Alkaline Phos 39 - 117 U/L 133(H) - 319(A)  AST 0 - 37 U/L 23 - 31  ALT 0 - 53 U/L 18 - 34    Lab Results  Component Value Date   WBC 3.0 (L)  03/11/2018   HGB 9.5 (L) 03/11/2018   HCT 28.8 (L) 03/11/2018   MCV 84.7 03/11/2018   PLT 75.0 (L) 03/11/2018   NEUTROABS 2.1 02/26/2018    ASSESSMENT & PLAN:  Symptomatic anemia Attributed to anemia chronic kidney disease, ideally we would need to do a bone marrow biopsy but because of his multiple health issues, supportive care only.  Lab review: 02/18/2018: Hemoglobin 8.6, MCV 83, WBC 3.5, platelets 86 creatinine 1.83 02/26/2018: Hemoglobin 9.1, platelets 92, MCV 85 03/11/2018: Hemoglobin 9.5, platelets 75, MCV 84, WBC 3  I reviewed the lab work with the patient and discussed extensively about performing a bone marrow biopsy.  He still wants to hold off on it unless his counts decreased dramatically. In order to watch him closely and I will see him once a month and then determine if any interventions are needed. I suppose that he could benefit from erythropoietin stimulating agent like Aranesp.  However because his  hemoglobin was 9.5, I recommended still watchful monitoring.  If his hemoglobin drops below 8 then we will perform bone marrow biopsy immediately.  Leukopenia: I discussed with him the role of white blood cells.  He did not have a differential and the most recent studies.  His white count fluctuates up and down.  We will continue to watch and monitor this. Return to clinic in 1 month for follow-up   No orders of the defined types were placed in this encounter.  The patient has a good understanding of the overall plan. he agrees with it. he will call with any problems that may develop before the next visit here.   Harriette Ohara, MD 03/15/18

## 2018-03-22 ENCOUNTER — Encounter: Payer: Self-pay | Admitting: Internal Medicine

## 2018-04-02 ENCOUNTER — Ambulatory Visit (HOSPITAL_BASED_OUTPATIENT_CLINIC_OR_DEPARTMENT_OTHER)
Admission: RE | Admit: 2018-04-02 | Discharge: 2018-04-02 | Disposition: A | Discharge status: Home / Self Care | Payer: Medicare Other | Source: Ambulatory Visit | Attending: Adult Health | Admitting: Adult Health

## 2018-04-02 DIAGNOSIS — J438 Other emphysema: Secondary | ICD-10-CM | POA: Insufficient documentation

## 2018-04-02 DIAGNOSIS — J984 Other disorders of lung: Secondary | ICD-10-CM | POA: Insufficient documentation

## 2018-04-02 DIAGNOSIS — I288 Other diseases of pulmonary vessels: Secondary | ICD-10-CM | POA: Insufficient documentation

## 2018-04-02 DIAGNOSIS — I712 Thoracic aortic aneurysm, without rupture: Secondary | ICD-10-CM | POA: Insufficient documentation

## 2018-04-02 DIAGNOSIS — J432 Centrilobular emphysema: Secondary | ICD-10-CM | POA: Diagnosis not present

## 2018-04-02 DIAGNOSIS — I700 Atherosclerosis of aorta: Secondary | ICD-10-CM | POA: Diagnosis not present

## 2018-04-02 DIAGNOSIS — I2510 Atherosclerotic heart disease of native coronary artery without angina pectoris: Secondary | ICD-10-CM | POA: Insufficient documentation

## 2018-04-02 DIAGNOSIS — R918 Other nonspecific abnormal finding of lung field: Secondary | ICD-10-CM | POA: Diagnosis not present

## 2018-04-02 DIAGNOSIS — R911 Solitary pulmonary nodule: Secondary | ICD-10-CM | POA: Diagnosis present

## 2018-04-08 ENCOUNTER — Ambulatory Visit (INDEPENDENT_AMBULATORY_CARE_PROVIDER_SITE_OTHER): Payer: Medicare Other | Admitting: Adult Health

## 2018-04-08 ENCOUNTER — Encounter: Payer: Self-pay | Admitting: Adult Health

## 2018-04-08 DIAGNOSIS — J449 Chronic obstructive pulmonary disease, unspecified: Secondary | ICD-10-CM

## 2018-04-08 DIAGNOSIS — G4733 Obstructive sleep apnea (adult) (pediatric): Secondary | ICD-10-CM | POA: Diagnosis not present

## 2018-04-08 DIAGNOSIS — R918 Other nonspecific abnormal finding of lung field: Secondary | ICD-10-CM

## 2018-04-08 DIAGNOSIS — Z23 Encounter for immunization: Secondary | ICD-10-CM

## 2018-04-08 DIAGNOSIS — Z9989 Dependence on other enabling machines and devices: Secondary | ICD-10-CM

## 2018-04-08 DIAGNOSIS — J9611 Chronic respiratory failure with hypoxia: Secondary | ICD-10-CM | POA: Diagnosis not present

## 2018-04-08 MED ORDER — OLODATEROL HCL 2.5 MCG/ACT IN AERS: 1.0000 | INHALATION_SPRAY | Freq: Every day | RESPIRATORY_TRACT | Status: DC

## 2018-04-08 NOTE — Assessment & Plan Note (Signed)
Doing well on CPAP.  No changes 

## 2018-04-08 NOTE — Progress Notes (Addendum)
@Patient  ID: [name], male    DOB: [birth year], 77 y.o.   MRN: [id number]  Chief Complaint  Patient presents with  . 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  PFT 10/2015 FEV1 was 36%, ratio 46, FVC 58%, DLCO 26% CXR 11/2016 LUL PNA ,  CT chest 12/2016 -masslike consolidation in LUL  CT chest 03/12/17 >stable masslike consolidation LUL 3.9cm , LUL 7 mm nodule  PET scan 01/23/46 >+metabolic act in LUL consolidation CT chest February 2019> nodular left-sided airspace opacities have improved from prior study.  CT angio chest January 25, 2018> advanced emphysema, scarring of the left upper lobe, nodules of the left lower lobe are unchanged.  Scattered scarring in the right lung is unchanged. 08/2017 admitted with Sepsis /PNA  12/2017 Admitted with GIB , anemia   04/08/2018 Follow up : COPD , O2 RF , Lung nodules , OSA  Pt returns for follow up . Has underlying severe COPD that is oxygen dependent.  He says for the most part breathing is doing the same.  He had no flareup of his cough or wheezing.  Shortness of breath has been stable.  He gets winded with walking for long period of time.  He is quite active.  He has a caregiver for his wife who is very debilitated with COPD that is oxygen dependent as well.  Patient's wife is on hospice at home.  He says this is a great mental and physical stress for him.  He remains on Spiriva and Striverdi.   He is able to get his inhalers at the [place].  He is on oxygen 2 L.  He has a portable oxygen tank that is fillable so he is able to be more mobile.  He has a main concentrator at home.  Says he is doing well with his oxygen level.  O2 sats today on arrival were 95% on 2 L.  He was able to walk in to our office which is a good distance from the parking lot.[name]  He is been evaluated by hematology for ongoing anemia.  He has not a bone marrow biopsy candidate.  Hemoglobin is reported as stable..  Patient has known abnormal CT chest with scarring and lung nodules.  Patient had a follow-up CT chest on April 02, 2018 that showed several pulmonary nodules that are stable.  Left upper lobe scarring with parenchymal band is decreased in thickness compatible with evolving postinflammatory scarring.  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  Stable severe emphysema. We reviewed his CT scan in detail.  And plan for a follow-up CT chest in 1 year.  We discussed flu shot today and he would like to get this.  Doing well on CPAP . Never misses a night . Feels rested in am .   Allergies  Allergen Reactions  . No Known Allergies     Immunization History  Administered Date(s) Administered  .  Influenza Split 04/14/2011, 04/27/2013  . Influenza Whole 05/28/2009, 04/27/2010, 03/31/2012  . Influenza, High Dose Seasonal PF 03/21/2015, 05/16/2017  . Influenza,inj,Quad PF,6+ Mos 03/17/2014, 04/04/2016  . Influenza-Unspecified 05/26/2011, 05/07/2012  . Pneumococcal Conjugate-13 09/20/2013  . Pneumococcal Polysaccharide-23 05/28/2008, 04/25/2010  . Pneumococcal-Unspecified 04/27/2013  . Td 11/23/2009  . Zoster 04/28/2009    Past Medical History:  Diagnosis Date  . 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 Anxiety   . Aortic aneurysm ([place]) 09/02/2017  . BENIGN PROSTATIC HYPERTROPHY 11/23/2009  . Cardiomyopathy ([place]) 08/28/2016  . Chronic systolic CHF (congestive heart failure) ([place]) 09/02/2017  . COPD (chronic  obstructive pulmonary disease) ([place])   . CORONARY ARTERY DISEASE 11/23/2009  . 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 DEGENERATIVE JOINT DISEASE 11/23/2009  . DEPRESSION 11/23/2009  . FATIGUE 11/23/2009  . GAIT DISTURBANCE 12/10/2009  . HEMOPTYSIS UNSPECIFIED 05/07/2010  . High cholesterol   . HYPERTENSION 07/30/2009  . HYPOTHYROIDISM 07/30/2009  . Ischemic cardiomyopathy 09/02/2017  . 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 On home oxygen therapy    "2-3L; 24/7" (09/10/2016)  . OSA on CPAP   . PTSD (post-traumatic stress disorder) 03/10/2012  . 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 RA (rheumatoid arthritis) ([place]) 06/11/2011   "qwhere" (09/10/2016)  . RESPIRATORY FAILURE, CHRONIC 07/31/2009  . Scleritis of both eyes 03/17/2014  . 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 Type II diabetes mellitus (HCC)     Tobacco History: Social History   Tobacco Use  Smoking Status Former Smoker  . Packs/day: 2.50  . Years: 40.00  . Pack years: 100.00  . Types: Cigarettes, Pipe, Cigars  . Last attempt to quit: 07/28/1998  . Years since quitting: 19.7  Smokeless Tobacco Never Used   Counseling given: Not Answered   Outpatient Medications Prior to Visit  Medication Sig Dispense Refill  . albuterol (PROVENTIL) (2.5 MG/3ML) 0.083% nebulizer solution 1 vial in nebulizer every 6 hours as needed Dx 496 (Patient taking differently: Take 2.5 mg by nebulization every 6 (six) hours as needed for wheezing or shortness of breath. 1 vial in nebulizer every 6 hours as needed Dx 496) 120 mL 6  . ALPRAZolam (XANAX) 0.5 MG tablet Take 0.5 mg by mouth at bedtime.     [name] apixaban (ELIQUIS) 5 MG TABS tablet Take 1 tablet (5 mg total) by mouth 2 (two) times daily. 180 tablet 3  . atorvastatin (LIPITOR) 40 MG tablet Take 1 tablet (40 mg total) by mouth daily. 90 tablet 3  . Carboxymethylcellul-Glycerin (LUBRICATING EYE DROPS OP) Apply 1 drop to eye daily as needed (dry eyes).    . carvedilol (COREG) 3.125 MG tablet Take 1 tablet (3.125 mg total)  by mouth daily. 180 tablet 0  . Cholecalciferol (VITAMIN D3) 2000 units capsule Take 2,000 Units by mouth daily.     . insulin glargine (LANTUS) 100 UNIT/ML injection Inject 0.4 mLs (40 Units total) into the skin at bedtime. (Patient taking differently: Inject 45 Units into the skin at bedtime. ) 10 mL 0  . levothyroxine (SYNTHROID, LEVOTHROID) 125 MCG tablet Take 1.5 tablets (188 mcg total) by mouth daily before breakfast. (Patient taking differently: Take 125 mcg by mouth daily before breakfast. ) 60 tablet 0  . metFORMIN (GLUCOPHAGE) 500 MG tablet Take 500 mg by mouth 2 (two) times daily with a meal.    . Multiple Vitamins-Minerals (MEGA MULTIVITAMIN FOR MEN PO) Take 1 tablet by mouth daily.     . OXYGEN Inhale 2 L into the lungs continuous.    [name]  pregabalin (LYRICA) 50 MG capsule Take 50 mg by mouth 2 (two) times daily.    . Semaglutide (OZEMPIC) 1 MG/DOSE SOPN Inject 1 mg into the skin once a week.    . tamsulosin (FLOMAX) 0.4 MG CAPS capsule Take 1 capsule (0.4 mg total) by mouth at bedtime. 30 capsule 0  . tiotropium (SPIRIVA HANDIHALER) 18 MCG inhalation capsule Place 1 capsule (18 mcg total) into inhaler and inhale daily. 30 capsule 4   No facility-administered medications prior to visit.      Review of Systems  Constitutional:   No  weight loss, night sweats,  Fevers, chills,  +fatigue, or  lassitude.  HEENT:   No headaches,  Difficulty swallowing,  Tooth/dental problems, or  Sore throat,                No sneezing, itching, ear ache, nasal congestion, post nasal drip,   CV:  No chest pain,  Orthopnea, PND, swelling in lower extremities, anasarca, dizziness, palpitations, syncope.   GI  No heartburn, indigestion, abdominal pain, nausea, vomiting, diarrhea, change in bowel habits, loss of appetite, bloody stools.   Resp:  .  No chest wall deformity  Skin: no rash or lesions.  GU: no dysuria, change in color of urine, no urgency or frequency.  No flank pain, no hematuria    MS:  No joint pain or swelling.  No decreased range of motion.  No back pain.    Physical Exam  BP 118/70   Pulse (!) 58   Ht 6' 1"  (1.854 m)   Wt 206 lb (93.4 kg)   SpO2 95%   BMI 27.18 kg/m   GEN: A/Ox3; pleasant , NAD, elderly, on oxygen   HEENT:  [place]/AT,  EACs-clear, TMs-wnl, NOSE-clear, THROAT-clear, no lesions, no postnasal drip or exudate noted.   NECK:  Supple w/ fair ROM; no JVD; normal carotid impulses w/o bruits; no thyromegaly or nodules palpated; no lymphadenopathy.    RESP diminished breath sounds in the bases  no accessory muscle use, no dullness to percussion  CARD:  RRR, no m/r/g, tr peripheral edema, pulses intact, no cyanosis or clubbing.  GI:   Soft & nt; nml bowel sounds; no organomegaly or masses detected.   Musco: Warm bil, no deformities or joint swelling noted.   Neuro: alert, no focal deficits noted.    Skin: Warm, no lesions or rashes    Lab Results:  CBC    Component Value Date/Time   WBC 3.0 (L) 03/11/2018 1103   RBC 3.40 (L) 03/11/2018 1103   HGB 9.5 (L) 03/11/2018 1103   HGB 9.1 (L) 02/26/2018 1027   HGB 11.0 (L) 12/23/2016 1053   HCT 28.8 (L) 03/11/2018 1103   HCT 29.6 (L) 01/23/2018 0830   PLT 75.0 (L) 03/11/2018 1103   PLT 92 (L) 02/26/2018 1027   PLT 125 (L) 12/23/2016 1053   MCV 84.7 03/11/2018 1103   MCV 84 12/23/2016 1053   MCH 27.5 02/26/2018 1027   MCHC 32.9 03/11/2018 1103   RDW 19.3 (H) 03/11/2018 1103   RDW 15.6 (H) 12/23/2016 1053   LYMPHSABS 1.3 02/26/2018 1027   LYMPHSABS 1.2 12/23/2016 1053   MONOABS 0.6 02/26/2018 1027   EOSABS 0.2 02/26/2018 1027   EOSABS 0.3 12/23/2016 1053   BASOSABS 0.0 02/26/2018 1027   BASOSABS 0.0 12/23/2016 1053    BMET    Component Value Date/Time   NA 139 03/11/2018 1103   NA 139 02/17/2018   K 4.5 03/11/2018 1103  CL 106 03/11/2018 1103   CO2 28 03/11/2018 1103   GLUCOSE 156 (H) 03/11/2018 1103   BUN 18 03/11/2018 1103   BUN 30 (H) 01/11/2018 0805   CREATININE  1.70 (H) 03/11/2018 1103   CALCIUM 8.8 03/11/2018 1103   GFRNONAA 34 (L) 01/24/2018 0513   GFRAA 39 (L) 01/24/2018 0513    BNP No results found for: BNP  ProBNP    Component Value Date/Time   PROBNP 188 10/20/2016 1038   PROBNP 59.9 05/04/2011 0517    Imaging: Ct Chest Wo Contrast  Result Date: 04/02/2018 CLINICAL DATA:  Follow-up pulmonary nodules.  COPD. EXAM: CT CHEST WITHOUT CONTRAST TECHNIQUE: Multidetector CT imaging of the chest was performed following the standard protocol without IV contrast. COMPARISON:  01/25/2018 chest CT angiogram.  08/31/2017 chest CT. FINDINGS: Cardiovascular: Normal heart size. No significant pericardial effusion/thickening. 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 Stable dilated main pulmonary artery (3.9 cm diameter). Three-vessel coronary atherosclerosis. Mediastinum/Nodes: No discrete thyroid nodules. Unremarkable esophagus. No pathologically enlarged axillary, mediastinal or hilar lymph nodes, noting limited sensitivity for the detection of hilar adenopathy on this noncontrast study. Lungs/Pleura: No pneumothorax. Stable coarse plaque-like calcification throughout the anterior and posterior right pleural space without significant pleural effusions. Severe centrilobular and paraseptal emphysema with diffuse bronchial wall thickening. Extensive parenchymal banding at the right lung base with associated volume loss and distortion, stable, compatible with pleural-parenchymal scarring. Thick parenchymal band in the anterior left upper lobe measuring up to 0.9 cm thickness (series 3/image 37), slightly decreased from 1.1 cm on 08/31/2017 chest CT, compatible with evolving postinfectious/postinflammatory scarring. Several poorly marginated pulmonary nodules scattered throughout the left lung are all stable since 08/31/2017, largest 1.4 cm in the superior segment left lower lobe (series  3/image 60), 1.8 cm in the posterior left lower lobe (series 3/image 104) and 1.1 cm in anterior left upper lobe (series 3/image 38). No acute consolidative airspace disease or new significant pulmonary nodules. Upper abdomen: No acute abnormality. Musculoskeletal: No aggressive appearing focal osseous lesions. Mild thoracic spondylosis. IMPRESSION: 1. No acute pulmonary disease. 2. Several poorly marginated pulmonary nodules scattered throughout the left lung are all stable since 08/31/2017, decreased from 06/12/2017 and new since 03/12/2017 chest CT studies, most compatible with stable postinflammatory nodularity. Continued chest CT surveillance is advised in 12 months in this high risk patient. 3. Thick parenchymal band in the anterior left upper lobe is slightly decreased in thickness, compatible with evolving postinfectious/postinflammatory scarring. 4. Stable right fibrothorax with associated extensive basilar pleural-parenchymal scarring in the right lung. 5. Severe centrilobular and paraseptal emphysema with diffuse bronchial wall thickening, compatible with the provided history of COPD. 6. Three-vessel coronary atherosclerosis. 7. Stable dilated main pulmonary artery, suggesting chronic pulmonary arterial hypertension. 8. 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 Aortic Atherosclerosis (ICD10-I70.0) and Emphysema (ICD10-J43.9). Electronically Signed   By: Ilona Sorrel M.D.   On: 04/02/2018 13:37   Reviewed independently and agree with findings   Assessment & Plan:   COPD (chronic obstructive pulmonary disease) ([place]) Compensated on present regimen  Plan  No changes  Flu shot today -65 older injection done   OSA on CPAP Doing well on CPAP  No changes    Chronic respiratory failure with hypoxia (HCC) Cont on o2 , no changes, doing well   Lung mass Abnormal CT chest with previous masslike consolidation , decreasing in size with c/w evolving scarring . Lung nodules stable.  Rexene Edison, NP 04/08/2018

## 2018-04-08 NOTE — Patient Instructions (Addendum)
Continue on Spiriva and Striverdi daily , rinse after use .  Continue on Oxygen 2l/m .  Continue on CPAP At bedtime  With oxygen .  Flu shot today .  Plan for CT chest in 03/2019 .  Follow up in 3 months with Kevante Lunt NP in [facility] clinic  Please contact office for sooner follow up if symptoms do not improve or worsen or seek emergency care

## 2018-04-08 NOTE — Addendum Note (Signed)
Addended by: [name] E on: 04/08/2018 03:31 PM   Modules accepted: Orders

## 2018-04-08 NOTE — Assessment & Plan Note (Signed)
Compensated on present regimen  Plan  No changes  Flu shot today -65 older injection done

## 2018-04-08 NOTE — Assessment & Plan Note (Signed)
Cont on o2 , no changes, doing well

## 2018-04-08 NOTE — Assessment & Plan Note (Signed)
Abnormal CT chest with previous masslike consolidation , decreasing in size with c/w evolving scarring . Lung nodules stable.

## 2018-04-14 NOTE — Assessment & Plan Note (Signed)
02/18/2018: Hemoglobin 8.6, MCV 83, WBC 3.5, platelets 86 creatinine 1.83 02/26/2018: Hemoglobin 9.1, platelets 92, MCV 85 03/11/2018: Hemoglobin 9.5, platelets 75, MCV 84, WBC 3  I reviewed the lab work with the patient and discussed extensively about performing a bone marrow biopsy.  He still wants to hold off on it unless his counts decreased dramatically. In order to watch him closely and I will see him once a month and then determine if any interventions are needed.  Leukopenia: I discussed with him the role of white blood cells.  He did not have a differential and the most recent studies.  His white count fluctuates up and down.  We will continue to watch and monitor this. Return to clinic in 1 month for follow-up

## 2018-04-15 ENCOUNTER — Telehealth: Payer: Self-pay | Admitting: Hematology and Oncology

## 2018-04-15 ENCOUNTER — Inpatient Hospital Stay: Payer: Medicare Other | Admitting: Hematology and Oncology

## 2018-04-15 ENCOUNTER — Inpatient Hospital Stay: Payer: Medicare Other | Attending: Hematology and Oncology

## 2018-04-15 DIAGNOSIS — D72819 Decreased white blood cell count, unspecified: Secondary | ICD-10-CM | POA: Insufficient documentation

## 2018-04-15 DIAGNOSIS — D631 Anemia in chronic kidney disease: Secondary | ICD-10-CM

## 2018-04-15 DIAGNOSIS — D649 Anemia, unspecified: Secondary | ICD-10-CM

## 2018-04-15 DIAGNOSIS — D696 Thrombocytopenia, unspecified: Secondary | ICD-10-CM

## 2018-04-15 DIAGNOSIS — Z7901 Long term (current) use of anticoagulants: Secondary | ICD-10-CM | POA: Diagnosis not present

## 2018-04-15 DIAGNOSIS — Z79899 Other long term (current) drug therapy: Secondary | ICD-10-CM | POA: Diagnosis not present

## 2018-04-15 DIAGNOSIS — N189 Chronic kidney disease, unspecified: Secondary | ICD-10-CM

## 2018-04-15 LAB — CBC WITH DIFFERENTIAL (CANCER CENTER ONLY)
BASOS ABS: 0 10*3/uL (ref 0.0–0.1)
BASOS PCT: 1 %
EOS PCT: 10 %
Eosinophils Absolute: 0.4 10*3/uL (ref 0.0–0.5)
HCT: 32.2 % — ABNORMAL LOW (ref 38.4–49.9)
Hemoglobin: 10.3 g/dL — ABNORMAL LOW (ref 13.0–17.1)
Lymphocytes Relative: 22 %
Lymphs Abs: 0.8 10*3/uL — ABNORMAL LOW (ref 0.9–3.3)
MCH: 26.8 pg — ABNORMAL LOW (ref 27.2–33.4)
MCHC: 32 g/dL (ref 32.0–36.0)
MCV: 83.8 fL (ref 79.3–98.0)
MONO ABS: 0.6 10*3/uL (ref 0.1–0.9)
Monocytes Relative: 15 %
Neutro Abs: 2 10*3/uL (ref 1.5–6.5)
Neutrophils Relative %: 52 %
PLATELETS: 76 10*3/uL — AB (ref 140–400)
RBC: 3.84 MIL/uL — ABNORMAL LOW (ref 4.20–5.82)
RDW: 17.8 % — AB (ref 11.0–14.6)
WBC Count: 3.8 10*3/uL — ABNORMAL LOW (ref 4.0–10.3)

## 2018-04-15 LAB — IRON AND TIBC
Iron: 47 ug/dL (ref 42–163)
Saturation Ratios: 15 % — ABNORMAL LOW (ref 42–163)
TIBC: 318 ug/dL (ref 202–409)
UIBC: 270 ug/dL

## 2018-04-15 LAB — CMP (CANCER CENTER ONLY)
ALBUMIN: 3.5 g/dL (ref 3.5–5.0)
ALT: 17 U/L (ref 0–44)
AST: 24 U/L (ref 15–41)
Alkaline Phosphatase: 125 U/L (ref 38–126)
Anion gap: 8 (ref 5–15)
BILIRUBIN TOTAL: 0.6 mg/dL (ref 0.3–1.2)
BUN: 19 mg/dL (ref 8–23)
CALCIUM: 9.4 mg/dL (ref 8.9–10.3)
CO2: 27 mmol/L (ref 22–32)
Chloride: 106 mmol/L (ref 98–111)
Creatinine: 1.43 mg/dL — ABNORMAL HIGH (ref 0.61–1.24)
GFR, Est AFR Am: 53 mL/min — ABNORMAL LOW (ref >60)
GFR, Estimated: 46 mL/min — ABNORMAL LOW (ref >60)
GLUCOSE: 194 mg/dL — AB (ref 70–99)
Potassium: 4.9 mmol/L (ref 3.5–5.1)
Sodium: 141 mmol/L (ref 135–145)
TOTAL PROTEIN: 6.3 g/dL — AB (ref 6.5–8.1)

## 2018-04-15 LAB — FERRITIN: Ferritin: 196 ng/mL (ref 24–336)

## 2018-04-15 MED ORDER — LEVOTHYROXINE SODIUM 200 MCG PO TABS: 188.0000 ug | ORAL_TABLET | Freq: Every day | ORAL | Status: DC

## 2018-04-15 NOTE — Progress Notes (Signed)
Patient Care Team: Myrlene Broker, MD as PCP - General (Internal Medicine) Ernesto Rutherford, MD as Consulting Physician (Ophthalmology) Kalman Shan, MD as Consulting Physician (Pulmonary Disease) System, Provider Not In as Consulting Physician Casimer Lanius, MD as Consulting Physician (Rheumatology) Oretha Milch, MD as Consulting Physician (Pulmonary Disease) Lewayne Bunting, MD as Consulting Physician (Cardiology)  DIAGNOSIS:  Encounter Diagnosis  Name Primary?  . Symptomatic anemia     CHIEF COMPLIANT: Follow-up on severe anemia  INTERVAL HISTORY: [name] is a 77 year old with above-mentioned history of severe anemia due to chronic kidney disease who is here for a follow-up of his blood work.  He does not have any new complaints or concerns.  He uses oxygen 24 x 7.  REVIEW OF SYSTEMS:   Constitutional: Denies fevers, chills or abnormal weight loss Eyes: Denies blurriness of vision Ears, nose, mouth, throat, and face: Denies mucositis or sore throat Respiratory: Chronic shortness of breath Cardiovascular: Denies palpitation, chest discomfort Gastrointestinal:  Denies nausea, heartburn or change in bowel habits Skin: Denies abnormal skin rashes Lymphatics: Denies new lymphadenopathy or easy bruising Neurological:Denies numbness, tingling or new weaknesses Behavioral/Psych: Mood is stable, no new changes  Extremities: No lower extremity edema   All other systems were reviewed with the patient and are negative.  I have reviewed the past medical history, past surgical history, social history and family history with the patient and they are unchanged from previous note.  ALLERGIES:  is allergic to no known allergies.  MEDICATIONS:  Current Outpatient Medications  Medication Sig Dispense Refill  . albuterol (PROVENTIL) (2.5 MG/3ML) 0.083% nebulizer solution 1 vial in nebulizer every 6 hours as needed Dx 496 (Patient taking differently: Take 2.5 mg by  nebulization every 6 (six) hours as needed for wheezing or shortness of breath. 1 vial in nebulizer every 6 hours as needed Dx 496) 120 mL 6  . ALPRAZolam (XANAX) 0.5 MG tablet Take 0.5 mg by mouth at bedtime.     [name] apixaban (ELIQUIS) 5 MG TABS tablet Take 1 tablet (5 mg total) by mouth 2 (two) times daily. 180 tablet 3  . atorvastatin (LIPITOR) 40 MG tablet Take 1 tablet (40 mg total) by mouth daily. 90 tablet 3  . Carboxymethylcellul-Glycerin (LUBRICATING EYE DROPS OP) Apply 1 drop to eye daily as needed (dry eyes).    . carvedilol (COREG) 3.125 MG tablet Take 1 tablet (3.125 mg total) by mouth daily. 180 tablet 0  . Cholecalciferol (VITAMIN D3) 2000 units capsule Take 2,000 Units by mouth daily.     . insulin glargine (LANTUS) 100 UNIT/ML injection Inject 0.4 mLs (40 Units total) into the skin at bedtime. (Patient taking differently: Inject 45 Units into the skin at bedtime. ) 10 mL 0  . levothyroxine (SYNTHROID, LEVOTHROID) 125 MCG tablet Take 1.5 tablets (188 mcg total) by mouth daily before breakfast. (Patient taking differently: Take 125 mcg by mouth daily before breakfast. ) 60 tablet 0  . metFORMIN (GLUCOPHAGE) 500 MG tablet Take 500 mg by mouth 2 (two) times daily with a meal.    . Multiple Vitamins-Minerals (MEGA MULTIVITAMIN FOR MEN PO) Take 1 tablet by mouth daily.     . Olodaterol HCl (STRIVERDI RESPIMAT) 2.5 MCG/ACT AERS Inhale 1 puff into the lungs daily.    . OXYGEN Inhale 2 L into the lungs continuous.    . pregabalin (LYRICA) 50 MG capsule Take 50 mg by mouth 2 (two) times daily.    . Semaglutide (OZEMPIC) 1 MG/DOSE SOPN  Inject 1 mg into the skin once a week.    . tamsulosin (FLOMAX) 0.4 MG CAPS capsule Take 1 capsule (0.4 mg total) by mouth at bedtime. 30 capsule 0  . tiotropium (SPIRIVA HANDIHALER) 18 MCG inhalation capsule Place 1 capsule (18 mcg total) into inhaler and inhale daily. 30 capsule 4   No current facility-administered medications for this visit.     PHYSICAL  EXAMINATION: ECOG PERFORMANCE STATUS: 1 - Symptomatic but completely ambulatory  Vitals:   04/15/18 1127  BP: (!) 144/67  Pulse: 65  Resp: (!) 21  Temp: 98 F (36.7 C)  SpO2: 93%   Filed Weights   04/15/18 1127  Weight: 205 lb 14.4 oz (93.4 kg)    GENERAL:alert, no distress and comfortable SKIN: skin color, texture, turgor are normal, no rashes or significant lesions EYES: normal, Conjunctiva are pink and non-injected, sclera clear OROPHARYNX:no exudate, no erythema and lips, buccal mucosa, and tongue normal  NECK: supple, thyroid normal size, non-tender, without nodularity LYMPH:  no palpable lymphadenopathy in the cervical, axillary or inguinal LUNGS: clear to auscultation and percussion with normal breathing effort HEART: regular rate & rhythm and no murmurs and no lower extremity edema ABDOMEN:abdomen soft, non-tender and normal bowel sounds MUSCULOSKELETAL:no cyanosis of digits and no clubbing  NEURO: alert & oriented x 3 with fluent speech, no focal motor/sensory deficits EXTREMITIES: No lower extremity edema    LABORATORY DATA:  I have reviewed the data as listed CMP Latest Ref Rng & Units 04/15/2018 03/11/2018 02/18/2018  Glucose 70 - 99 mg/dL 809(X) 833(A) 250(N)  BUN 8 - 23 mg/dL 19 18 21   Creatinine 0.61 - 1.24 mg/dL ) 3.97(Q) 7.34(L)  Sodium 135 - 145 mmol/L 141 139 138  Potassium 3.5 - 5.1 mmol/L 4.9 4.5 4.5  Chloride 98 - 111 mmol/L 106 106 103  CO2 22 - 32 mmol/L 27 28 27   Calcium 8.9 - 10.3 mg/dL 9.4 8.8 8.8  Total Protein 6.5 - 8.1 g/dL 6.3(L) 6.0 -  Total Bilirubin 0.3 - 1.2 mg/dL 0.6 0.7 -  Alkaline Phos 38 - 126 U/L 125 133(H) -  AST 15 - 41 U/L 24 23 -  ALT 0 - 44 U/L 17 18 -    Lab Results  Component Value Date   WBC 3.8 (L) 04/15/2018   HGB 10.3 (L) 04/15/2018   HCT 32.2 (L) 04/15/2018   MCV 83.8 04/15/2018   PLT 76 (L) 04/15/2018   NEUTROABS 2.0 04/15/2018    ASSESSMENT & PLAN:  Symptomatic anemia 02/18/2018: Hemoglobin 8.6, MCV  83, WBC 3.5, platelets 86 creatinine 1.83 02/26/2018: Hemoglobin 9.1, platelets 92, MCV 85 03/11/2018: Hemoglobin 9.5, platelets 75, MCV 84, WBC 3 04/15/2018: Hemoglobin is 10.3, platelets 75 WBC 3.8  I reviewed the lab work with the patient   Leukopenia: Relatively stable  return to clinic in 3 months for follow-up   No orders of the defined types were placed in this encounter.  The patient has a good understanding of the overall plan. he agrees with it. he will call with any problems that may develop before the next visit here.   03/13/2018, MD 04/15/18

## 2018-04-15 NOTE — Telephone Encounter (Signed)
Per 9/19 los.  Appt for 3 months already on schedule.  Cancelled Oct/Nov appts as reqeusted.  Mailed patient calendar.

## 2018-04-28 ENCOUNTER — Encounter: Payer: Self-pay | Admitting: Internal Medicine

## 2018-04-28 ENCOUNTER — Ambulatory Visit: Payer: Medicare Other | Admitting: Internal Medicine

## 2018-04-28 VITALS — BP 122/60 | HR 67 | Temp 97.6°F | Ht 73.0 in | Wt 207.0 lb

## 2018-04-28 DIAGNOSIS — D649 Anemia, unspecified: Secondary | ICD-10-CM | POA: Diagnosis not present

## 2018-04-28 NOTE — Patient Instructions (Signed)
We do not need to make any changes today.    

## 2018-04-28 NOTE — Progress Notes (Signed)
   Subjective:    Patient ID: [name], male    DOB: [birth year], 77 y.o.   MRN: [id number]  HPI The patient is a 77 YO man coming in for follow up of his anemia. He is continuing to improve since the iron transfusion. He is having some more energy. 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 He denies blood in stool or dark stool. Denies SOB or cough. Still bruising quite a lot due to blood thinners.   Review of Systems  Constitutional: Positive for appetite change and fatigue.  HENT: Negative.   Respiratory: Negative for cough, chest tightness and shortness of breath.   Cardiovascular: Negative for chest pain, palpitations and leg swelling.  Gastrointestinal: Negative for abdominal distention, abdominal pain, constipation, diarrhea, nausea and vomiting.  Musculoskeletal: Negative.   Skin: Negative.   Neurological: Negative.       Objective:   Physical Exam  Constitutional: He is oriented to person, place, and time. He appears well-developed and well-nourished.  HENT:  Head: Normocephalic and atraumatic.  Eyes: EOM are normal.  Neck: Normal range of motion.  Cardiovascular: Normal rate and regular rhythm.  Pulmonary/Chest: Effort normal and breath sounds normal. No respiratory distress. He has no wheezes. He has no rales.  Oxygen portable  Abdominal: Soft. Bowel sounds are normal. He exhibits no distension. There is no tenderness. There is no rebound.  Musculoskeletal: He exhibits no edema.  Neurological: He is alert and oriented to person, place, and time. Coordination normal.  Skin: Skin is warm and dry.   Vitals:   04/28/18 0848  BP: 122/60  Pulse: 67  Temp: 97.6 F (36.4 C)  TempSrc: Oral  SpO2: 95%  Weight: 207 lb (93.9 kg)  Height: 6\' 1"  (1.854 m)      Assessment & Plan:

## 2018-04-30 NOTE — Assessment & Plan Note (Addendum)
Improving and Hg continues to improve on recent labs. No bleeding noted clinically. Will continue to monitor and he will take iron daily still.

## 2018-05-13 ENCOUNTER — Other Ambulatory Visit: Payer: Medicare Other

## 2018-05-13 ENCOUNTER — Ambulatory Visit: Payer: Medicare Other | Admitting: Hematology and Oncology

## 2018-05-27 ENCOUNTER — Ambulatory Visit: Payer: Medicare Other | Admitting: Family

## 2018-05-27 ENCOUNTER — Other Ambulatory Visit: Payer: Medicare Other

## 2018-05-27 ENCOUNTER — Other Ambulatory Visit (INDEPENDENT_AMBULATORY_CARE_PROVIDER_SITE_OTHER): Payer: Medicare Other

## 2018-05-27 ENCOUNTER — Encounter: Payer: Self-pay | Admitting: Family

## 2018-05-27 VITALS — BP 130/62 | HR 81 | Temp 97.8°F | Ht 73.0 in | Wt 209.1 lb

## 2018-05-27 DIAGNOSIS — R300 Dysuria: Secondary | ICD-10-CM

## 2018-05-27 LAB — COMPREHENSIVE METABOLIC PANEL
ALK PHOS: 81 U/L (ref 39–117)
ALT: 18 U/L (ref 0–53)
AST: 22 U/L (ref 0–37)
Albumin: 4.1 g/dL (ref 3.5–5.2)
BILIRUBIN TOTAL: 0.6 mg/dL (ref 0.2–1.2)
BUN: 22 mg/dL (ref 6–23)
CALCIUM: 9.4 mg/dL (ref 8.4–10.5)
CO2: 27 meq/L (ref 19–32)
CREATININE: 1.27 mg/dL (ref 0.40–1.50)
Chloride: 107 mEq/L (ref 96–112)
GFR: 58.49 mL/min — ABNORMAL LOW (ref >60.00)
Glucose, Bld: 95 mg/dL (ref 70–99)
Potassium: 4.6 mEq/L (ref 3.5–5.1)
Sodium: 142 mEq/L (ref 135–145)
TOTAL PROTEIN: 6.2 g/dL (ref 6.0–8.3)

## 2018-05-27 LAB — CBC WITH DIFFERENTIAL/PLATELET
BASOS ABS: 0 10*3/uL (ref 0.0–0.1)
Basophils Relative: 1.1 % (ref 0.0–3.0)
EOS ABS: 0.2 10*3/uL (ref 0.0–0.7)
Eosinophils Relative: 5.5 % — ABNORMAL HIGH (ref 0.0–5.0)
HEMATOCRIT: 32.3 % — AB (ref 39.0–52.0)
Hemoglobin: 10.9 g/dL — ABNORMAL LOW (ref 13.0–17.0)
LYMPHS PCT: 25.9 % (ref 12.0–46.0)
Lymphs Abs: 1.1 10*3/uL (ref 0.7–4.0)
MCHC: 33.7 g/dL (ref 30.0–36.0)
MCV: 81.7 fl (ref 78.0–100.0)
Monocytes Absolute: 0.7 10*3/uL (ref 0.1–1.0)
Monocytes Relative: 14.9 % — ABNORMAL HIGH (ref 3.0–12.0)
NEUTROS ABS: 2.3 10*3/uL (ref 1.4–7.7)
Neutrophils Relative %: 52.6 % (ref 43.0–77.0)
PLATELETS: 66 10*3/uL — AB (ref 150.0–400.0)
RBC: 3.95 Mil/uL — ABNORMAL LOW (ref 4.22–5.81)
RDW: 15.3 % (ref 11.5–15.5)
WBC: 4.4 10*3/uL (ref 4.0–10.5)

## 2018-05-27 LAB — POC URINALSYSI DIPSTICK (AUTOMATED)
Bilirubin, UA: NEGATIVE
Blood, UA: NEGATIVE
GLUCOSE UA: NEGATIVE
Ketones, UA: NEGATIVE
NITRITE UA: NEGATIVE
Protein, UA: POSITIVE — AB
Spec Grav, UA: 1.015 (ref 1.010–1.025)
UROBILINOGEN UA: 0.2 U/dL
pH, UA: 6 (ref 5.0–8.0)

## 2018-05-27 LAB — PSA: PSA: 0.1 ng/mL (ref 0.10–4.00)

## 2018-05-27 MED ORDER — AMOXICILLIN-POT CLAVULANATE 875-125 MG PO TABS: 1.0000 | ORAL_TABLET | Freq: Two times a day (BID) | ORAL | 0 refills | Status: DC

## 2018-05-27 NOTE — Progress Notes (Signed)
[name] is a 77 y.o. male with the following history as recorded in Epic[employer]:  Patient Active Problem List   Diagnosis Date Noted  . AKI (acute kidney injury) ([place]) 02/12/2018  . Thrombocytopenia ([place]) 01/22/2018  . Symptomatic anemia 01/20/2018  . Dizziness 01/18/2018  . Chronic respiratory failure with hypoxia ([place]) 12/01/2017  . Lung mass 12/01/2017  . PAF (paroxysmal atrial fibrillation) ([place])   . Ischemic cardiomyopathy 09/02/2017  . Chronic systolic CHF (congestive heart failure) ([place]) 09/02/2017  . Aortic aneurysm ([place]) 09/02/2017  . Diabetes mellitus with complication, with long-term current use of insulin ([place]) 09/01/2017  . Anxiety and depression 09/01/2017  . COPD (chronic obstructive pulmonary disease) ([place]) 09/01/2017  . Hyperlipidemia 01/05/2014  . OSA on CPAP 06/10/2011  . Coronary atherosclerosis 11/23/2009  . Hypothyroidism 07/30/2009  . Essential hypertension 07/30/2009    Current Outpatient Medications  Medication Sig Dispense Refill  . albuterol (PROVENTIL) (2.5 MG/3ML) 0.083% nebulizer solution 1 vial in nebulizer every 6 hours as needed Dx 496 (Patient taking differently: Take 2.5 mg by nebulization every 6 (six) hours as needed for wheezing or shortness of breath. 1 vial in nebulizer every 6 hours as needed Dx 496) 120 mL 6  . ALPRAZolam (XANAX) 0.5 MG tablet Take 0.5 mg by mouth at bedtime.     [name] amiodarone (PACERONE) 200 MG tablet TK 1 T PO D  0  . apixaban (ELIQUIS) 5 MG TABS tablet Take 1 tablet (5 mg total) by mouth 2 (two) times daily. 180 tablet 3  . atorvastatin (LIPITOR) 40 MG tablet Take 1 tablet (40 mg total) by mouth daily. 90 tablet 3  . Carboxymethylcellul-Glycerin (LUBRICATING EYE DROPS OP) Apply 1 drop to eye daily as needed (dry eyes).    . carvedilol (COREG) 3.125 MG tablet Take 1 tablet (3.125 mg total) by mouth daily. 180 tablet 0  . Cholecalciferol (VITAMIN D3) 2000 units capsule Take 2,000 Units by mouth daily.     . insulin  glargine (LANTUS) 100 UNIT/ML injection Inject 0.4 mLs (40 Units total) into the skin at bedtime. (Patient taking differently: Inject 45 Units into the skin at bedtime. ) 10 mL 0  . levothyroxine (SYNTHROID, LEVOTHROID) 200 MCG tablet Take 1 tablet (200 mcg total) by mouth daily before breakfast.    . metFORMIN (GLUCOPHAGE) 500 MG tablet Take 500 mg by mouth 2 (two) times daily with a meal.    . Multiple Vitamins-Minerals (MEGA MULTIVITAMIN FOR MEN PO) Take 1 tablet by mouth daily.     . Olodaterol HCl (STRIVERDI RESPIMAT) 2.5 MCG/ACT AERS Inhale 1 puff into the lungs daily.    . OXYGEN Inhale 2 L into the lungs continuous.    . pregabalin (LYRICA) 50 MG capsule Take 50 mg by mouth 2 (two) times daily.    . Semaglutide (OZEMPIC) 1 MG/DOSE SOPN Inject 1 mg into the skin once a week.    . tamsulosin (FLOMAX) 0.4 MG CAPS capsule Take 1 capsule (0.4 mg total) by mouth at bedtime. 30 capsule 0  . tiotropium (SPIRIVA HANDIHALER) 18 MCG inhalation capsule Place 1 capsule (18 mcg total) into inhaler and inhale daily. 30 capsule 4  . amoxicillin-clavulanate (AUGMENTIN) 875-125 MG tablet Take 1 tablet by mouth 2 (two) times daily. 20 tablet 0   No current facility-administered medications for this visit.     Allergies: No known allergies  Past Medical History:  Diagnosis Date  . 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 Anxiety   .  Aortic aneurysm ([place]) 09/02/2017  . BENIGN PROSTATIC HYPERTROPHY 11/23/2009  . Cardiomyopathy ([place]) 08/28/2016  . Chronic systolic CHF (congestive heart failure) ([place]) 09/02/2017  . COPD (chronic obstructive pulmonary disease) ([place])   . CORONARY ARTERY DISEASE 11/23/2009  . 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 DEGENERATIVE JOINT DISEASE 11/23/2009  . DEPRESSION 11/23/2009  . FATIGUE 11/23/2009  . GAIT DISTURBANCE 12/10/2009  . HEMOPTYSIS UNSPECIFIED 05/07/2010  . High cholesterol   . HYPERTENSION 07/30/2009  . HYPOTHYROIDISM 07/30/2009  . Ischemic cardiomyopathy 09/02/2017   . 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 On home oxygen therapy    "2-3L; 24/7" (09/10/2016)  . OSA on CPAP   . PTSD (post-traumatic stress disorder) 03/10/2012  . 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 RA (rheumatoid arthritis) ([place]) 06/11/2011   "qwhere" (09/10/2016)  . RESPIRATORY FAILURE, CHRONIC 07/31/2009  . Scleritis of both eyes 03/17/2014  . 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 Type II diabetes mellitus ([place])     Past Surgical History:  Procedure Laterality Date  . 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 ABDOMINAL EXPLORATION SURGERY  02/2004   w/LOA/notes 12/10/2010; small bowel obstruction repair with adhesiolysis   . BACK SURGERY    . CARDIAC CATHETERIZATION     2 heart caths in the past.  One in 2000s showed one ulcerated plaque  Rx medically; [facility] at [facility] Archie Endo 09/05/2016  . CATARACT EXTRACTION W/ INTRAOCULAR LENS  IMPLANT, BILATERAL Bilateral 2000s  . COLECTOMY     hx of remote ileum resection due to bleeding  . COLONOSCOPY WITH PROPOFOL N/A 01/22/2018   Procedure: COLONOSCOPY WITH PROPOFOL;  Surgeon: Gatha Mayer, MD;  Location: [facility];  Service: Endoscopy;  Laterality: N/A;  . CORONARY ANGIOPLASTY WITH STENT PLACEMENT  09/10/2016  . CORONARY STENT INTERVENTION N/A 09/10/2016   Procedure: Coronary Stent Intervention;  Surgeon: Burnell Blanks, MD;  Location: [place] [facility];  Service: Cardiovascular;  Laterality: N/A;  Distal RCA 4.0x16 Synergy  . ESOPHAGOGASTRODUODENOSCOPY (EGD) WITH PROPOFOL N/A 01/22/2018   Procedure: ESOPHAGOGASTRODUODENOSCOPY (EGD) WITH PROPOFOL;  Surgeon: Gatha Mayer, MD;  Location: [facility];  Service: Endoscopy;  Laterality: N/A;  . FEMORAL EMBOLOECTOMY Left 07/2000   with left leg ischemia; Dr. Kellie Simmering, vascular  . GANGLION CYST EXCISION Right    "wrist"; Dr. Daylene Katayama  . HOT HEMOSTASIS N/A 01/22/2018   Procedure: HOT HEMOSTASIS (ARGON PLASMA COAGULATION/BICAP);  Surgeon: Gatha Mayer, MD;  Location: [facility];   Service: Endoscopy;  Laterality: N/A;  . LUMBAR LAMINECTOMY  1972   Dr. Collie Siad  . RIGHT/LEFT HEART CATH AND CORONARY ANGIOGRAPHY N/A 09/10/2016   Procedure: Right/Left Heart Cath and Coronary Angiography;  Surgeon: Burnell Blanks, MD;  Location: [place] [facility];  Service: Cardiovascular;  Laterality: N/A;  . TONSILLECTOMY      Family History  Problem Relation Age of Onset  . Other Mother        gun shot    Social History   Tobacco Use  . Smoking status: Former Smoker    Packs/day: 2.50    Years: 40.00    Pack years: 100.00    Types: Cigarettes, Pipe, Cigars    Last attempt to quit: 07/28/1998    Years since quitting: 19.8  . Smokeless tobacco: Never Used  Substance Use Topics  . Alcohol use: No    Alcohol/week: 0.0 standard drinks    Subjective:   Patient presents with concerns for 2 week history of burning on urination; notes that he  does have some urinary hesitancy as well; no fever; saw urology for urinary concerns 3 months ago- unable to find those notes; per patient, had some type of procedure and things have not been "right" since then; no blood in urine. No flank pain;     Objective:  Vitals:   05/27/18 0842  BP: 130/62  Pulse: 81  Temp: 97.8 F (36.6 C)  TempSrc: Oral  SpO2: 92%  Weight: 209 lb 1.3 oz (94.8 kg)  Height: 6' 1"  (1.854 m)    General: Well developed, well nourished, in no acute distress  Skin : Warm and dry.  Head: Normocephalic and atraumatic  Lungs: Respirations unlabored;  CVS exam: normal rate and regular rhythm.  Neurologic: Alert and oriented; speech intact; face symmetrical;  CNII-XII intact without focal deficit   Assessment:  1. Dysuria     Plan:  ? Prostatitis; check U/A and urine culture; Rx for Augmentin- safest option with his other medicaitons; will adjust based on culture results.   No follow-ups on file.  Orders Placed This Encounter  Procedures  . Urine Culture    Standing Status:   Future    Number of  Occurrences:   1    Standing Expiration Date:   05/27/2019  . CBC w/Diff    Standing Status:   Future    Number of Occurrences:   1    Standing Expiration Date:   05/27/2019  . Comp Met (CMET)    Standing Status:   Future    Number of Occurrences:   1    Standing Expiration Date:   05/27/2019  . PSA    Standing Status:   Future    Number of Occurrences:   1    Standing Expiration Date:   05/27/2019  . POCT Urinalysis Dipstick (Automated)    Requested Prescriptions   Signed Prescriptions Disp Refills  . amoxicillin-clavulanate (AUGMENTIN) 875-125 MG tablet 20 tablet 0    Sig: Take 1 tablet by mouth 2 (two) times daily.

## 2018-05-29 LAB — URINE CULTURE
MICRO NUMBER: 91311795
SPECIMEN QUALITY: ADEQUATE

## 2018-05-31 ENCOUNTER — Other Ambulatory Visit: Payer: Self-pay | Admitting: Family

## 2018-05-31 MED ORDER — NITROFURANTOIN MONOHYD MACRO 100 MG PO CAPS: 100.0000 mg | ORAL_CAPSULE | Freq: Two times a day (BID) | ORAL | 0 refills | Status: DC

## 2018-06-01 ENCOUNTER — Telehealth: Payer: Self-pay | Admitting: Family

## 2018-06-01 DIAGNOSIS — D696 Thrombocytopenia, unspecified: Secondary | ICD-10-CM

## 2018-06-01 NOTE — Telephone Encounter (Signed)
Spoke with patient and info given 

## 2018-06-01 NOTE — Telephone Encounter (Signed)
-----   Message from Serena Croissant, MD sent at 05/31/2018  6:06 PM EST ----- Thank you Vernona Rieger for sending me the message. This could be benign fluctuation. Please recheck if possible in 1-2 months and see if there is a pattern. It could be a normal variation. Thanks Vinay ----- Message ----- From: Olive Bass, FNP Sent: 05/31/2018   9:18 AM EST To: Serena Croissant, MD  Dr. Pamelia Hoit,  I wanted to reach out to you about this pleasant patient. I saw him for urinary symptoms and checked CBC as part of the work up. His platelets had dropped to 66 from 75 last month. I wanted you to be aware.  Thank you for your time- Ria Clock, FNP

## 2018-06-01 NOTE — Telephone Encounter (Signed)
2nd note for this patient- Please let him know that I did reach out to Dr. Pamelia Hoit about the platelets. He was not overly concerned at this time. He wants the patient to come get his CBC re-checked in about 1 month. I will put the orders in for him. Lab appt is fine.  Thanks-

## 2018-06-04 ENCOUNTER — Ambulatory Visit: Payer: Medicare Other | Admitting: Hematology and Oncology

## 2018-06-04 ENCOUNTER — Other Ambulatory Visit: Payer: Medicare Other

## 2018-06-10 ENCOUNTER — Other Ambulatory Visit: Payer: Medicare Other

## 2018-06-10 ENCOUNTER — Ambulatory Visit: Payer: Medicare Other | Admitting: Hematology and Oncology

## 2018-07-08 ENCOUNTER — Ambulatory Visit (INDEPENDENT_AMBULATORY_CARE_PROVIDER_SITE_OTHER): Payer: Medicare Other | Admitting: Adult Health

## 2018-07-08 ENCOUNTER — Encounter: Payer: Self-pay | Admitting: Adult Health

## 2018-07-08 DIAGNOSIS — J9611 Chronic respiratory failure with hypoxia: Secondary | ICD-10-CM | POA: Diagnosis not present

## 2018-07-08 DIAGNOSIS — Z9989 Dependence on other enabling machines and devices: Secondary | ICD-10-CM

## 2018-07-08 DIAGNOSIS — G4733 Obstructive sleep apnea (adult) (pediatric): Secondary | ICD-10-CM | POA: Diagnosis not present

## 2018-07-08 DIAGNOSIS — J449 Chronic obstructive pulmonary disease, unspecified: Secondary | ICD-10-CM | POA: Diagnosis not present

## 2018-07-08 DIAGNOSIS — R918 Other nonspecific abnormal finding of lung field: Secondary | ICD-10-CM

## 2018-07-08 DIAGNOSIS — I5022 Chronic systolic (congestive) heart failure: Secondary | ICD-10-CM

## 2018-07-08 NOTE — Progress Notes (Signed)
@Patient  ID: [name], male    DOB: 12-Sep-1940, 77 y.o.   MRN: [id number]  Chief Complaint  Patient presents with  . 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  PFT 10/2015 FEV1 was 36%, ratio 46, FVC 58%, DLCO 26% Echo 08/2016 >EF 35-40%, s/p stent , Echo 08/2017 40-45%, gr 2 DD  CXR 11/2016 LUL PNA ,  CT chest 12/2016 -masslike consolidation in LUL  CT chest 03/12/17 >stable masslike consolidation LUL 3.9cm , LUL 7 mm nodule  PET scan 03/20/17 >+metabolic act in LUL consolidation CT chest February 2019>nodular left-sided airspace opacities have improved from prior study. CT angio chest January 25, 2018>advanced emphysema, scarring of the left upper lobe, nodules of the left lower lobe are unchanged. Scattered scarring in the right lung is unchanged. CT chest September 2019 > several pulmonary nodules scattered throughout the left lung stable since February 2019 decreased from 05/2017 most compatible with stable postinflammatory nodularity with recommendations for follow-up CT chest in 12 months.,  Thick parenchymal band in the anterior left upper lobe slightly decreased in thickness, stable right fibrothorax with extensive basilar pleural-parenchymal scarring in the right lung, severe emphysema  08/2017 admitted with Sepsis [name]  12/2017 Admitted with GIB , anemia  02/2018 Hematology  following chronic anemia   07/08/2018 Follow up : COPD , O2 RF , Lung nodules, OSA  Patient returns for a 10-month follow-up.  Patient has underlying severe COPD that is oxygen dependent.  Patient says for the most part his breathing is doing about the same.  He gets winded with heavy activity.  He has not had any increased albuterol use.  He remains on Spiriva and Strivedi.  He is able to get his inhalers to the [place] system.  Patient is under quite a bit of stress.  He is the caregiver for his wife who is on hospice at home with debilitating COPD.  This puts a lot of physical and mental stress on him.. Denies any increased cough or shortness of breath.  He remains on oxygen 2 L.  Patient says O2 saturations have been doing well.  He has to stop and rest with heavy activity.  Patient is planning to go back to the gym and start walking on a regular basis if time allows..  Patient continues to follow with hematology for ongoing chronic anemia.  No known active bleeding.  Patient has an abnormal CT chest with scarring and lung nodules that is being followed over the last 2 years.  Most recent CT chest September 2019 showed pulmonary nodules were stable and concerning parenchymal band had decreased in thickness.  Patient has a planned follow-up CT chest in 1 year.  His immunizations are up-to-date with flu shot, Prevnar and Pneumovax..  Patient has sleep apnea is on CPAP at bedtime.  Patient says he feels rested with no significant daytime  sleepiness.  Patient says he never misses a night wears it all night long for at least 7 hours.  Download was requested.      Allergies  Allergen Reactions  . No Known Allergies     Immunization History  Administered Date(s) Administered  . Influenza Split 04/14/2011, 04/27/2013  . Influenza Whole 05/28/2009, 04/27/2010, 03/31/2012  . Influenza, High Dose Seasonal PF 03/21/2015, 05/16/2017, 04/08/2018  . Influenza,inj,Quad PF,6+ Mos 03/17/2014, 04/04/2016  .  Influenza-Unspecified 05/26/2011, 05/07/2012  . Pneumococcal Conjugate-13 09/20/2013  . Pneumococcal Polysaccharide-23 05/28/2008, 04/25/2010  . Pneumococcal-Unspecified 04/27/2013  . Td 11/23/2009  . Zoster 04/28/2009    Past Medical History:  Diagnosis Date  . 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 Anxiety   . Aortic aneurysm (HCC) 09/02/2017  . BENIGN PROSTATIC HYPERTROPHY 11/23/2009  . Cardiomyopathy (HCC) 08/28/2016  . Chronic systolic CHF (congestive heart failure) (HCC) 09/02/2017  . COPD (chronic obstructive pulmonary disease) (HCC)   . CORONARY ARTERY DISEASE 11/23/2009  . 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 DEGENERATIVE JOINT DISEASE 11/23/2009  . DEPRESSION 11/23/2009  . FATIGUE 11/23/2009  . GAIT DISTURBANCE 12/10/2009  . HEMOPTYSIS UNSPECIFIED 05/07/2010  . High cholesterol   . HYPERTENSION 07/30/2009  . HYPOTHYROIDISM 07/30/2009  . Ischemic cardiomyopathy 09/02/2017  . 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 On home oxygen therapy    "2-3L; 24/7" (09/10/2016)  . OSA on CPAP   . PTSD (post-traumatic stress disorder) 03/10/2012  . 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 RA (rheumatoid arthritis) (HCC) 06/11/2011   "qwhere" (09/10/2016)  . RESPIRATORY FAILURE, CHRONIC 07/31/2009  . Scleritis of both eyes 03/17/2014  . 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 Type II diabetes mellitus (HCC)     Tobacco History: Social History   Tobacco Use  Smoking Status Former Smoker  . Packs/day: 2.50  . Years: 40.00  . Pack years: 100.00  . Types: Cigarettes, Pipe, Cigars  . Last attempt to quit: 07/28/1998  . Years since quitting: 19.9  Smokeless Tobacco Never Used   Counseling given: Not Answered   Outpatient Medications Prior to Visit  Medication Sig Dispense Refill  . albuterol (PROVENTIL) (2.5 MG/3ML) 0.083% nebulizer solution 1 vial in nebulizer every 6 hours as needed Dx 496 (Patient taking differently: Take 2.5 mg by nebulization every 6 (six) hours as needed for wheezing or shortness of  breath. 1 vial in nebulizer every 6 hours as needed Dx 496) 120 mL 6  . ALPRAZolam (XANAX) 0.5 MG tablet Take 0.5 mg by mouth at bedtime.     [name] apixaban (ELIQUIS) 5 MG TABS tablet Take 1 tablet (5 mg total) by mouth 2 (two) times daily. 180 tablet 3  . atorvastatin (LIPITOR) 40 MG tablet Take 1 tablet (40 mg total) by mouth daily. 90 tablet 3  . Carboxymethylcellul-Glycerin (LUBRICATING EYE DROPS OP) Apply 1 drop to eye daily as needed (dry eyes).    . carvedilol (COREG) 3.125 MG tablet Take 1 tablet (3.125 mg total) by mouth daily. 180 tablet 0  . Cholecalciferol (VITAMIN D3) 2000 units capsule Take 2,000 Units by mouth daily.     . insulin glargine (LANTUS) 100 UNIT/ML injection Inject 0.4 mLs (40 Units total) into the skin at bedtime. (Patient taking differently: Inject 50 Units into the skin at bedtime. ) 10 mL 0  . levothyroxine (SYNTHROID, LEVOTHROID) 200 MCG tablet Take 1 tablet (200 mcg total) by mouth daily before breakfast. (Patient taking differently: Take 175 mcg by mouth daily before breakfast. )    .  metFORMIN (GLUCOPHAGE) 500 MG tablet Take 500 mg by mouth 2 (two) times daily with a meal.    . Multiple Vitamins-Minerals (MEGA MULTIVITAMIN FOR MEN PO) Take 1 tablet by mouth daily.     . Olodaterol HCl (STRIVERDI RESPIMAT) 2.5 MCG/ACT AERS Inhale 1 puff into the lungs daily.    . OXYGEN Inhale 2 L into the lungs continuous.    . pregabalin (LYRICA) 50 MG capsule Take 50 mg by mouth 2 (two) times daily.    . Semaglutide (OZEMPIC) 1 MG/DOSE SOPN Inject 1 mg into the skin once a week.    . tamsulosin (FLOMAX) 0.4 MG CAPS capsule Take 1 capsule (0.4 mg total) by mouth at bedtime. 30 capsule 0  . tiotropium (SPIRIVA HANDIHALER) 18 MCG inhalation capsule Place 1 capsule (18 mcg total) into inhaler and inhale daily. 30 capsule 4  . amiodarone (PACERONE) 200 MG tablet TK 1 T PO D  0  . nitrofurantoin, macrocrystal-monohydrate, (MACROBID) 100 MG capsule Take 1 capsule (100 mg total) by mouth 2  (two) times daily. (Patient not taking: Reported on 07/08/2018) 14 capsule 0   No facility-administered medications prior to visit.      Review of Systems  Constitutional:   No  weight loss, night sweats,  Fevers, chills,  +fatigue, or  lassitude.  HEENT:   No headaches,  Difficulty swallowing,  Tooth/dental problems, or  Sore throat,                No sneezing, itching, ear ache, nasal congestion, post nasal drip,   CV:  No chest pain,  Orthopnea, PND, +swelling in lower extremities, anasarca, dizziness, palpitations, syncope.   GI  No heartburn, indigestion, abdominal pain, nausea, vomiting, diarrhea, change in bowel habits, loss of appetite, bloody stools.   Resp:   No chest wall deformity  Skin: no rash or lesions.  GU: no dysuria, change in color of urine, no urgency or frequency.  No flank pain, no hematuria   MS:  No joint pain or swelling.  No decreased range of motion.  No back pain.    Physical Exam  BP 138/66 (BP Location: Left Arm, Cuff Size: Normal)   Pulse 65   Ht 6\' 1"  (1.854 m)   Wt 211 lb (95.7 kg)   SpO2 94%   BMI 27.84 kg/m   GEN: A/Ox3; pleasant , NAD, chronically ill-appearing on oxygen, walking with cane   HEENT:  [place]/AT,  EACs-clear, TMs-wnl, NOSE-clear, THROAT-clear, no lesions, no postnasal drip or exudate noted.   NECK:  Supple w/ fair ROM; no JVD; normal carotid impulses w/o bruits; no thyromegaly or nodules palpated; no lymphadenopathy.    RESP decreased breath sounds in the bases . no accessory muscle use, no dullness to percussion  CARD:  RRR, no m/r/g, tr  peripheral edema, pulses intact, no cyanosis or clubbing.  GI:   Soft & nt; nml bowel sounds; no organomegaly or masses detected.   Musco: Warm bil, no deformities or joint swelling noted.   Neuro: alert, no focal deficits noted.    Skin: Warm, no lesions or rashes    Lab Results:  CBC    Component Value Date/Time   WBC 4.4 05/27/2018 0913   RBC 3.95 (L) 05/27/2018 0913     HGB 10.9 (L) 05/27/2018 0913   HGB 10.3 (L) 04/15/2018 1008   HGB 11.0 (L) 12/23/2016 1053   HCT 32.3 (L) 05/27/2018 0913   HCT 29.6 (L) 01/23/2018 0830   PLT 66.0 (L)  05/27/2018 0913   PLT 76 (L) 04/15/2018 1008   PLT 125 (L) 12/23/2016 1053   MCV 81.7 05/27/2018 0913   MCV 84 12/23/2016 1053   MCH 26.8 (L) 04/15/2018 1008   MCHC 33.7 05/27/2018 0913   RDW 15.3 05/27/2018 0913   RDW 15.6 (H) 12/23/2016 1053   LYMPHSABS 1.1 05/27/2018 0913   LYMPHSABS 1.2 12/23/2016 1053   MONOABS 0.7 05/27/2018 0913   EOSABS 0.2 05/27/2018 0913   EOSABS 0.3 12/23/2016 1053   BASOSABS 0.0 05/27/2018 0913   BASOSABS 0.0 12/23/2016 1053    BMET    Component Value Date/Time   NA 142 05/27/2018 0913   NA 139 02/17/2018   K 4.6 05/27/2018 0913   CL 107 05/27/2018 0913   CO2 27 05/27/2018 0913   GLUCOSE 95 05/27/2018 0913   BUN 22 05/27/2018 0913   BUN 30 (H) 01/11/2018 0805   CREATININE 1.27 05/27/2018 0913   CREATININE 1.43 (H) 04/15/2018 1008   CALCIUM 9.4 05/27/2018 0913   GFRNONAA 46 (L) 04/15/2018 1008   GFRAA 53 (L) 04/15/2018 1008    BNP No results found for: BNP  ProBNP    Component Value Date/Time   PROBNP 188 10/20/2016 1038   PROBNP 59.9 05/04/2011 0517    Imaging: No results found.    PFT Results Latest Ref Rng & Units 11/16/2015 12/30/2013  FVC-Pre L 2.96 3.01  FVC-Predicted Pre % 59 59  FVC-Post L 2.89 3.01  FVC-Predicted Post % 58 59  Pre FEV1/FVC % % 43 45  Post FEV1/FCV % % 46 49  FEV1-Pre L 1.28 1.36  FEV1-Predicted Pre % 35 36  FEV1-Post L 1.33 1.48  DLCO UNC% % 26 26  DLCO COR %Predicted % 40 39  TLC L - 6.31  TLC % Predicted % - 80  RV % Predicted % - 80    No results found for: NITRICOXIDE      Assessment & Plan:   COPD (chronic obstructive pulmonary disease) (HCC) GOLD IV COPD -currently stable.  Patient is encouraged on activity as tolerated.  He is deconditioned and and walking would be a good exercise for him.  He is unable to go  to pulmonary rehab due to being the primary caregiver for his wife.  Plan  Patient Instructions  Continue on Spiriva and Striverdi daily , rinse after use .  Continue on Oxygen 2l/m Continue on CPAP At bedtime  With oxygen .  Plan for CT chest in 03/2019 .  Follow up in 3 months with Lujean Ebright NP or Dr. Vassie Loll  Please contact office for sooner follow up if symptoms do not improve or worsen or seek emergency care       OSA on CPAP Doing well on CPAP.  CPAP download was requested.  Patient is continue on CPAP at bedtime.  Chronic systolic CHF (congestive heart failure) (HCC) Is compensated without evidence of fluid overload on exam.  Patient continue with follow-up with cardiology and current medication regimen  Chronic respiratory failure with hypoxia (HCC) Continue on oxygen at 2 L.  Keeps O2 saturations greater than 88 to 90%.  Lung mass Scattered pulmonary nodules along with pleural-parenchymal scarring that appears to be stable on CT scan.  Follow-up CT scan in September 2020 is planned     Rubye Oaks, NP 07/08/2018

## 2018-07-08 NOTE — Assessment & Plan Note (Signed)
Is compensated without evidence of fluid overload on exam.  Patient continue with follow-up with cardiology and current medication regimen

## 2018-07-08 NOTE — Assessment & Plan Note (Addendum)
GOLD IV COPD -currently stable.  Patient is encouraged on activity as tolerated.  He is deconditioned and and walking would be a good exercise for him.  He is unable to go to pulmonary rehab due to being the primary caregiver for his wife.  Plan  Patient Instructions  Continue on Spiriva and Striverdi daily , rinse after use .  Continue on Oxygen 2l/m Continue on CPAP At bedtime  With oxygen .  Plan for CT chest in 03/2019 .  Follow up in 3 months with Parrett NP or Dr. Vassie Loll  Please contact office for sooner follow up if symptoms do not improve or worsen or seek emergency care

## 2018-07-08 NOTE — Assessment & Plan Note (Signed)
Doing well on CPAP.  CPAP download was requested.  Patient is continue on CPAP at bedtime.

## 2018-07-08 NOTE — Patient Instructions (Addendum)
Continue on Spiriva and Striverdi daily , rinse after use .  Continue on Oxygen 2l/m Continue on CPAP At bedtime  With oxygen .  Plan for CT chest in 03/2019 .  Follow up in 3 months with Parrett NP or Dr. Vassie Loll  Please contact office for sooner follow up if symptoms do not improve or worsen or seek emergency care

## 2018-07-08 NOTE — Assessment & Plan Note (Signed)
Continue on oxygen at 2 L.  Keeps O2 saturations greater than 88 to 90%.

## 2018-07-08 NOTE — Assessment & Plan Note (Signed)
Scattered pulmonary nodules along with pleural-parenchymal scarring that appears to be stable on CT scan.  Follow-up CT scan in September 2020 is planned

## 2018-07-14 NOTE — Progress Notes (Signed)
Patient Care Team: Myrlene Broker, MD as PCP - General (Internal Medicine) Ernesto Rutherford, MD as Consulting Physician (Ophthalmology) Kalman Shan, MD as Consulting Physician (Pulmonary Disease) System, Provider Not In as Consulting Physician Casimer Lanius, MD as Consulting Physician (Rheumatology) Oretha Milch, MD as Consulting Physician (Pulmonary Disease) Lewayne Bunting, MD as Consulting Physician (Cardiology)  DIAGNOSIS: Symptomatic anemia - Plan: CBC with Differential (Cancer Center Only)   CHIEF COMPLIANT: Follow-up of severe anemia  INTERVAL HISTORY: [name] is a 77 y.o. with above-mentioned history of severe anemia due to chronic kidney disease who is here for follow-up of his blood work. He presents to the clinic today and says he has been feeling well. He uses portable oxygen and a cane to get around. He reports he recently saw his rheumatologist, who believes his thyroid medication may be the reason for his low WBC counts. He reviewed his medication list with me.   REVIEW OF SYSTEMS:   Constitutional: Denies fevers, chills or abnormal weight loss Eyes: Denies blurriness of vision Ears, nose, mouth, throat, and face: Denies mucositis or sore throat Respiratory: Denies cough, dyspnea or wheezes Cardiovascular: Denies palpitation, chest discomfort Gastrointestinal:  Denies nausea, heartburn or change in bowel habits Skin: Denies abnormal skin rashes Lymphatics: Denies new lymphadenopathy or easy bruising Neurological:Denies numbness, tingling or new weaknesses Behavioral/Psych: Mood is stable, no new changes  Extremities: No lower extremity edema All other systems were reviewed with the patient and are negative.  I have reviewed the past medical history, past surgical history, social history and family history with the patient and they are unchanged from previous note.  ALLERGIES:  is allergic to no known allergies.  MEDICATIONS:  Current  Outpatient Medications  Medication Sig Dispense Refill  . albuterol (PROVENTIL) (2.5 MG/3ML) 0.083% nebulizer solution 1 vial in nebulizer every 6 hours as needed Dx 496 (Patient taking differently: Take 2.5 mg by nebulization every 6 (six) hours as needed for wheezing or shortness of breath. 1 vial in nebulizer every 6 hours as needed Dx 496) 120 mL 6  . ALPRAZolam (XANAX) 0.5 MG tablet Take 0.5 mg by mouth at bedtime.     [name] apixaban (ELIQUIS) 5 MG TABS tablet Take 1 tablet (5 mg total) by mouth 2 (two) times daily. 180 tablet 3  . atorvastatin (LIPITOR) 40 MG tablet Take 1 tablet (40 mg total) by mouth daily. 90 tablet 3  . Carboxymethylcellul-Glycerin (LUBRICATING EYE DROPS OP) Apply 1 drop to eye daily as needed (dry eyes).    . carvedilol (COREG) 3.125 MG tablet Take 1 tablet (3.125 mg total) by mouth daily. 180 tablet 0  . Cholecalciferol (VITAMIN D3) 2000 units capsule Take 2,000 Units by mouth daily.     . insulin glargine (LANTUS) 100 UNIT/ML injection Inject 0.4 mLs (40 Units total) into the skin at bedtime. (Patient taking differently: Inject 50 Units into the skin at bedtime. ) 10 mL 0  . levothyroxine (SYNTHROID, LEVOTHROID) 175 MCG tablet Take 1 tablet (175 mcg total) by mouth daily before breakfast.    . metFORMIN (GLUCOPHAGE) 500 MG tablet Take 500 mg by mouth 2 (two) times daily with a meal.    . Multiple Vitamins-Minerals (MEGA MULTIVITAMIN FOR MEN PO) Take 1 tablet by mouth daily.     . Olodaterol HCl (STRIVERDI RESPIMAT) 2.5 MCG/ACT AERS Inhale 1 puff into the lungs daily.    . OXYGEN Inhale 2 L into the lungs continuous.    . pregabalin (LYRICA) 50 MG  capsule Take 50 mg by mouth 2 (two) times daily.    . Semaglutide (OZEMPIC) 1 MG/DOSE SOPN Inject 1 mg into the skin once a week.    . tamsulosin (FLOMAX) 0.4 MG CAPS capsule Take 1 capsule (0.4 mg total) by mouth at bedtime. 30 capsule 0  . tiotropium (SPIRIVA HANDIHALER) 18 MCG inhalation capsule Place 1 capsule (18 mcg total)  into inhaler and inhale daily. 30 capsule 4   No current facility-administered medications for this visit.     PHYSICAL EXAMINATION: ECOG PERFORMANCE STATUS: 2 - Symptomatic, <50% confined to bed  Vitals:   07/15/18 1120  BP: (!) 174/82  Pulse: 64  Resp: 17  Temp: (!) 97.2 F (36.2 C)  SpO2: 100%   Filed Weights   07/15/18 1120  Weight: 212 lb 6.4 oz (96.3 kg)    GENERAL:alert, no distress and comfortable SKIN: skin color, texture, turgor are normal, no rashes or significant lesions EYES: normal, Conjunctiva are pink and non-injected, sclera clear OROPHARYNX:no exudate, no erythema and lips, buccal mucosa, and tongue normal  NECK: supple, thyroid normal size, non-tender, without nodularity LYMPH:  no palpable lymphadenopathy in the cervical, axillary or inguinal LUNGS: clear to auscultation and percussion with normal breathing effort HEART: regular rate & rhythm and no murmurs and no lower extremity edema ABDOMEN:abdomen soft, non-tender and normal bowel sounds MUSCULOSKELETAL:no cyanosis of digits and no clubbing  NEURO: alert & oriented x 3 with fluent speech, no focal motor/sensory deficits EXTREMITIES: No lower extremity edema  LABORATORY DATA:  I have reviewed the data as listed CMP Latest Ref Rng & Units 05/27/2018 04/15/2018 03/11/2018  Glucose 70 - 99 mg/dL 95 315(Q) 008(Q)  BUN 6 - 23 mg/dL 22 19 18   Creatinine 0.40 - 1.50 mg/dL 7.61 9.50(D) 3.26(Z)  Sodium 135 - 145 mEq/L 142 141 139  Potassium 3.5 - 5.1 mEq/L 4.6 4.9 4.5  Chloride 96 - 112 mEq/L 107 106 106  CO2 19 - 32 mEq/L 27 27 28   Calcium 8.4 - 10.5 mg/dL 9.4 9.4 8.8  Total Protein 6.0 - 8.3 g/dL 6.2 6.3(L) 6.0  Total Bilirubin 0.2 - 1.2 mg/dL 0.6 0.6 0.7  Alkaline Phos 39 - 117 U/L 81 125 133(H)  AST 0 - 37 U/L 22 24 23   ALT 0 - 53 U/L 18 17 18     Lab Results  Component Value Date   WBC 4.3 07/15/2018   HGB 10.6 (L) 07/15/2018   HCT 34.2 (L) 07/15/2018   MCV 86.4 07/15/2018   PLT 63 (L)  07/15/2018   NEUTROABS 2.0 07/15/2018    ASSESSMENT & PLAN:  Symptomatic anemia 02/18/2018: Hemoglobin 8.6, MCV 83, WBC 3.5, platelets 86 creatinine 1.83 02/26/2018: Hemoglobin 9.1, platelets 92, MCV 85 03/11/2018: Hemoglobin 9.5, platelets 75, MCV 84, WBC 3 04/15/2018: Hemoglobin is 10.3, platelets 75 WBC 3.8 07/15/2018: Hemoglobin 10.6, platelets 63, WBC 4.3  I reviewed the lab work with the patient   Leukopenia:  Resolved Thrombocytopenia: Probably due to his rheumatology medication.  The dose will be reduced.  We will wait and see if this makes the platelet count better. I discussed with him that he does not have any risk of bleeding unless the platelet counts drop below 20.  return to clinic in 4 months for follow-up    Orders Placed This Encounter  Procedures  . CBC with Differential (Cancer Center Only)    Standing Status:   Future    Standing Expiration Date:   07/16/2019   The patient has  a good understanding of the overall plan. he agrees with it. he will call with any problems that may develop before the next visit here.  Serena Croissant, MD 07/15/2018   I, Kirt Boys Dorshimer, am acting as scribe for Serena Croissant, MD.  I have reviewed the above documentation for accuracy and completeness, and I agree with the above.

## 2018-07-14 NOTE — Assessment & Plan Note (Signed)
02/18/2018: Hemoglobin 8.6, MCV 83, WBC 3.5, platelets 86 creatinine 1.83 02/26/2018: Hemoglobin 9.1, platelets 92, MCV 85 03/11/2018: Hemoglobin 9.5, platelets 75, MCV 84, WBC 3 04/15/2018: Hemoglobin is 10.3, platelets 75 WBC 3.8  I reviewed the lab work with the patient   Leukopenia: Relatively stable  return to clinic in 6 months for follow-up

## 2018-07-15 ENCOUNTER — Telehealth: Payer: Self-pay | Admitting: Hematology and Oncology

## 2018-07-15 ENCOUNTER — Inpatient Hospital Stay: Payer: Medicare Other | Attending: Hematology and Oncology

## 2018-07-15 ENCOUNTER — Inpatient Hospital Stay: Payer: Medicare Other | Admitting: Hematology and Oncology

## 2018-07-15 DIAGNOSIS — N189 Chronic kidney disease, unspecified: Secondary | ICD-10-CM

## 2018-07-15 DIAGNOSIS — D631 Anemia in chronic kidney disease: Secondary | ICD-10-CM | POA: Insufficient documentation

## 2018-07-15 DIAGNOSIS — Z79899 Other long term (current) drug therapy: Secondary | ICD-10-CM

## 2018-07-15 DIAGNOSIS — D696 Thrombocytopenia, unspecified: Secondary | ICD-10-CM | POA: Diagnosis not present

## 2018-07-15 DIAGNOSIS — D649 Anemia, unspecified: Secondary | ICD-10-CM

## 2018-07-15 LAB — CMP (CANCER CENTER ONLY)
ALT: 15 U/L (ref 0–44)
AST: 22 U/L (ref 15–41)
Albumin: 3.9 g/dL (ref 3.5–5.0)
Alkaline Phosphatase: 97 U/L (ref 38–126)
Anion gap: 9 (ref 5–15)
BUN: 18 mg/dL (ref 8–23)
CHLORIDE: 107 mmol/L (ref 98–111)
CO2: 26 mmol/L (ref 22–32)
Calcium: 9.5 mg/dL (ref 8.9–10.3)
Creatinine: 1.12 mg/dL (ref 0.61–1.24)
GFR, Est AFR Am: >60 mL/min (ref >60)
GFR, Estimated: >60 mL/min (ref >60)
GLUCOSE: 114 mg/dL — AB (ref 70–99)
Potassium: 4.5 mmol/L (ref 3.5–5.1)
Sodium: 142 mmol/L (ref 135–145)
Total Bilirubin: 0.6 mg/dL (ref 0.3–1.2)
Total Protein: 6.8 g/dL (ref 6.5–8.1)

## 2018-07-15 LAB — FERRITIN: FERRITIN: 170 ng/mL (ref 24–336)

## 2018-07-15 LAB — CBC WITH DIFFERENTIAL (CANCER CENTER ONLY)
ABS IMMATURE GRANULOCYTES: 0.01 10*3/uL (ref 0.00–0.07)
Basophils Absolute: 0 10*3/uL (ref 0.0–0.1)
Basophils Relative: 1 %
Eosinophils Absolute: 0.3 10*3/uL (ref 0.0–0.5)
Eosinophils Relative: 6 %
HCT: 34.2 % — ABNORMAL LOW (ref 39.0–52.0)
Hemoglobin: 10.6 g/dL — ABNORMAL LOW (ref 13.0–17.0)
IMMATURE GRANULOCYTES: 0 %
Lymphocytes Relative: 33 %
Lymphs Abs: 1.4 10*3/uL (ref 0.7–4.0)
MCH: 26.8 pg (ref 26.0–34.0)
MCHC: 31 g/dL (ref 30.0–36.0)
MCV: 86.4 fL (ref 80.0–100.0)
MONOS PCT: 14 %
Monocytes Absolute: 0.6 10*3/uL (ref 0.1–1.0)
Neutro Abs: 2 10*3/uL (ref 1.7–7.7)
Neutrophils Relative %: 46 %
Platelet Count: 63 10*3/uL — ABNORMAL LOW (ref 150–400)
RBC: 3.96 MIL/uL — ABNORMAL LOW (ref 4.22–5.81)
RDW: 15.2 % (ref 11.5–15.5)
WBC Count: 4.3 10*3/uL (ref 4.0–10.5)
nRBC: 0 % (ref 0.0–0.2)

## 2018-07-15 LAB — IRON AND TIBC
Iron: 49 ug/dL (ref 42–163)
Saturation Ratios: 16 % — ABNORMAL LOW (ref 20–55)
TIBC: 311 ug/dL (ref 202–409)
UIBC: 262 ug/dL (ref 117–376)

## 2018-07-15 MED ORDER — LEVOTHYROXINE SODIUM 175 MCG PO TABS: 175.0000 ug | ORAL_TABLET | Freq: Every day | ORAL | Status: DC

## 2018-07-15 NOTE — Telephone Encounter (Signed)
Gave avs and calendar ° °

## 2018-07-16 LAB — ERYTHROPOIETIN: Erythropoietin: 12 m[IU]/mL (ref 2.6–18.5)

## 2018-08-02 ENCOUNTER — Encounter: Payer: Self-pay | Admitting: Internal Medicine

## 2018-08-02 ENCOUNTER — Ambulatory Visit: Payer: Medicare Other | Admitting: Internal Medicine

## 2018-08-02 VITALS — BP 138/80 | HR 70 | Temp 98.2°F | Ht 73.0 in | Wt 213.0 lb

## 2018-08-02 DIAGNOSIS — N179 Acute kidney failure, unspecified: Secondary | ICD-10-CM

## 2018-08-02 DIAGNOSIS — D649 Anemia, unspecified: Secondary | ICD-10-CM | POA: Diagnosis not present

## 2018-08-02 DIAGNOSIS — I10 Essential (primary) hypertension: Secondary | ICD-10-CM | POA: Diagnosis not present

## 2018-08-02 NOTE — Patient Instructions (Signed)
We are not checking blood work today.

## 2018-08-02 NOTE — Assessment & Plan Note (Signed)
Taking coreg and losartan. Having mild leg swelling which is likely benign. No signs of volume overload today and given past AKI would like to avoid diuretics unless absolutely necessary and the edema trace is not adversely affecting him.

## 2018-08-02 NOTE — Assessment & Plan Note (Signed)
Improving with normalization of kidney function. He had iron infusion last year. Denies blood in stool or bleeding.

## 2018-08-02 NOTE — Progress Notes (Signed)
   Subjective:   Patient ID: [name], male    DOB: 1940-10-21, 78 y.o.   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   Review of Systems  Constitutional: Positive for fatigue. Negative for activity change, appetite change and chills.  HENT: Negative.   Eyes: Negative.   Respiratory: Negative for cough, chest tightness and shortness of breath.        On oxygen  Cardiovascular: Negative for chest pain, palpitations and leg swelling.  Gastrointestinal: Negative for abdominal distention, abdominal pain, constipation, diarrhea, nausea and vomiting.  Musculoskeletal: Negative.   Skin: Negative.   Neurological: Negative.   Psychiatric/Behavioral: Positive for dysphoric mood. Negative for agitation, behavioral problems, self-injury, sleep disturbance and suicidal ideas. The patient is not hyperactive.     Objective:  Physical Exam Constitutional:      Appearance: He is well-developed.  HENT:     Head: Normocephalic and atraumatic.  Neck:     Musculoskeletal: Normal range of motion.  Cardiovascular:     Rate and Rhythm: Normal rate and regular rhythm.  Pulmonary:     Effort: Pulmonary effort is normal. No respiratory distress.     Breath sounds: Normal breath sounds. No wheezing or rales.     Comments: On oxygen Abdominal:     General: Bowel sounds are normal. There is no distension.     Palpations: Abdomen is soft.     Tenderness: There is no abdominal tenderness. There is no rebound.  Musculoskeletal:     Comments:  Trace edema bilateral ankles  Skin:    General: Skin is warm and dry.  Neurological:     Mental Status: He is alert and oriented to person, place, and time.     Coordination: Coordination normal.  Psychiatric:        Mood and Affect: Mood normal.     Vitals:   08/02/18 0843  BP: 138/80  Pulse: 70  Temp: 98.2 F (36.8 C)  TempSrc: Oral  SpO2: 92%  Weight: 213 lb (96.6 kg)  Height: 6\' 1"  (1.854 m)    Assessment & Plan:

## 2018-08-02 NOTE — Assessment & Plan Note (Signed)
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 GFR >60 now.

## 2018-08-30 NOTE — Progress Notes (Signed)
HPI: FU CAD.Cardiac cath 2/18 showed 30 LM, 50 LAD, aneurysmal RCA with 80 mid to distal; had DES to RCA at that time.Last echo 2/19 showed EF 40-45, grade 2 DD, mild RAE. Carotid dopplers 2/19 showed no significant stenosis. Admitted with sepsis 2/19 and also with atrial fibrillation, converted spontaneously to sinus; TSH low and synthroid adjusted. Placed on amiodarone.  Amiodarone discontinued at previous office visit.  Monitor performed May 2019 showed no significant arrhythmia.  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  The left internal iliac was occluded area right and left common femoral artery aneurysms 1.8 and 1.7 cm. There were numerous mesenteric and retroperitoneal lymph nodes increased and lymphoproliferative disorder could not be excluded.  Since last seen,he has chronic dyspnea on exertion but no orthopnea, PND, chest pain or syncope.  Current Outpatient Medications  Medication Sig Dispense Refill  . albuterol (PROVENTIL) (2.5 MG/3ML) 0.083% nebulizer solution 1 vial in nebulizer every 6 hours as needed Dx 496 (Patient taking differently: Take 2.5 mg by nebulization every 6 (six) hours as needed for wheezing or shortness of breath. 1 vial in nebulizer every 6 hours as needed Dx 496) 120 mL 6  . ALPRAZolam (XANAX) 0.5 MG tablet Take 0.5 mg by mouth at bedtime.     [name] apixaban (ELIQUIS) 5 MG TABS tablet Take 1 tablet (5 mg total) by mouth 2 (two) times daily. 180 tablet 3  . atorvastatin (LIPITOR) 40 MG tablet Take 1 tablet (40 mg total) by mouth daily. 90 tablet 3  . Carboxymethylcellul-Glycerin (LUBRICATING EYE DROPS OP) Apply 1 drop to eye daily as needed (dry eyes).    . carvedilol (COREG) 3.125 MG tablet Take 1 tablet (3.125 mg total) by mouth daily. 180 tablet 0  . Cholecalciferol (VITAMIN D3) 2000 units capsule Take 2,000 Units by mouth daily.     . insulin  glargine (LANTUS) 100 UNIT/ML injection Inject 0.4 mLs (40 Units total) into the skin at bedtime. (Patient taking differently: Inject 50 Units into the skin at bedtime. ) 10 mL 0  . levothyroxine (SYNTHROID, LEVOTHROID) 137 MCG tablet Take 137 mcg by mouth daily before breakfast.    . losartan (COZAAR) 25 MG tablet Take 25 mg by mouth daily.    . metFORMIN (GLUCOPHAGE) 500 MG tablet Take 500 mg by mouth 2 (two) times daily with a meal.    . Multiple Vitamins-Minerals (MEGA MULTIVITAMIN FOR MEN PO) Take 1 tablet by mouth daily.     . Olodaterol HCl (STRIVERDI RESPIMAT) 2.5 MCG/ACT AERS Inhale 1 puff into the lungs daily.    . OXYGEN Inhale 2 L into the lungs continuous.    . pregabalin (LYRICA) 50 MG capsule Take 50 mg by mouth 2 (two) times daily.    . Semaglutide (OZEMPIC) 1 MG/DOSE SOPN Inject 1 mg into the skin once a week.    . tamsulosin (FLOMAX) 0.4 MG CAPS capsule Take 1 capsule (0.4 mg total) by mouth at bedtime. 30 capsule 0  . tiotropium (SPIRIVA HANDIHALER) 18 MCG inhalation capsule Place 1 capsule (18 mcg total) into inhaler and inhale daily. 30 capsule 4   No current facility-administered medications for this visit.      Past Medical History:  Diagnosis Date  . 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 Anxiety   . Aortic aneurysm (HCC) 09/02/2017  . BENIGN PROSTATIC  HYPERTROPHY 11/23/2009  . Cardiomyopathy (HCC) 08/28/2016  . Chronic systolic CHF (congestive heart failure) (HCC) 09/02/2017  . COPD (chronic obstructive pulmonary disease) (HCC)   . CORONARY ARTERY DISEASE 11/23/2009  . 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 DEGENERATIVE JOINT DISEASE 11/23/2009  . DEPRESSION 11/23/2009  . FATIGUE 11/23/2009  . GAIT DISTURBANCE 12/10/2009  . HEMOPTYSIS UNSPECIFIED 05/07/2010  . High cholesterol   . HYPERTENSION 07/30/2009  . HYPOTHYROIDISM 07/30/2009  . Ischemic cardiomyopathy 09/02/2017  . 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 On home oxygen therapy    "2-3L; 24/7" (09/10/2016)    . OSA on CPAP   . PTSD (post-traumatic stress disorder) 03/10/2012  . 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 RA (rheumatoid arthritis) (HCC) 06/11/2011   "qwhere" (09/10/2016)  . RESPIRATORY FAILURE, CHRONIC 07/31/2009  . Scleritis of both eyes 03/17/2014  . 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 Type II diabetes mellitus (HCC)     Past Surgical History:  Procedure Laterality Date  . 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 ABDOMINAL EXPLORATION SURGERY  02/2004   w/LOA/notes 12/10/2010; small bowel obstruction repair with adhesiolysis   . BACK SURGERY    . CARDIAC CATHETERIZATION     2 heart caths in the past.  One in 2000s showed one ulcerated plaque  Rx medically; Second at [facility] [name] 09/05/2016  . CATARACT EXTRACTION W/ INTRAOCULAR LENS  IMPLANT, BILATERAL Bilateral 2000s  . COLECTOMY     hx of remote ileum resection due to bleeding  . COLONOSCOPY WITH PROPOFOL N/A 01/22/2018   Procedure: COLONOSCOPY WITH PROPOFOL;  Surgeon: Iva Boop, MD;  Location: [facility] ENDOSCOPY;  Service: Endoscopy;  Laterality: N/A;  . CORONARY ANGIOPLASTY WITH STENT PLACEMENT  09/10/2016  . CORONARY STENT INTERVENTION N/A 09/10/2016   Procedure: Coronary Stent Intervention;  Surgeon: Kathleene Hazel, MD;  Location[facility] INVASIVE CV LAB;  Service: Cardiovascular;  Laterality: N/A;  Distal RCA 4.0x16 Synergy  . ESOPHAGOGASTRODUODENOSCOPY (EGD) WITH PROPOFOL N/A 01/22/2018   Procedure: ESOPHAGOGASTRODUODENOSCOPY (EGD) WITH PROPOFOL;  Surgeon: Iva Boop, MD;  Location: [facility] ENDOSCOPY;  Service: Endoscopy;  Laterality: N/A;  . FEMORAL EMBOLOECTOMY Left 07/2000   with left leg ischemia; Dr. Hart Rochester, vascular  . GANGLION CYST EXCISION Right    "wrist"; Dr. Teressa Senter  . HOT HEMOSTASIS N/A 01/22/2018   Procedure: HOT HEMOSTASIS (ARGON PLASMA COAGULATION/BICAP);  Surgeon: Iva Boop, MD;  Location: [facility];  Service: Endoscopy;  Laterality: N/A;  . LUMBAR LAMINECTOMY  1972   Dr. Fannie Knee  . RIGHT/LEFT HEART  CATH AND CORONARY ANGIOGRAPHY N/A 09/10/2016   Procedure: Right/Left Heart Cath and Coronary Angiography;  Surgeon: Kathleene Hazel, MD;  Location: [facility] INVASIVE CV LAB;  Service: Cardiovascular;  Laterality: N/A;  . TONSILLECTOMY      Social History   Socioeconomic History  . Marital status: Married    Spouse name: Not on file  . Number of children: Not on file  . Years of education: Not on file  . Highest education level: Not on file  Occupational History  . Occupation: disabled Cytogeneticist, Ex Neurosurgeon: RETIRED  Social Needs  . Financial resource strain: Not on file  . Food insecurity:    Worry: Not on file    Inability: Not on file  . Transportation needs:    Medical: Not on file    Non-medical: Not on file  Tobacco Use  . Smoking status: Former Smoker    Packs/day: 2.50    Years:  40.00    Pack years: 100.00    Types: Cigarettes, Pipe, Cigars    Last attempt to quit: 07/28/1998    Years since quitting: 20.1  . Smokeless tobacco: Never Used  Substance and Sexual Activity  . Alcohol use: No    Alcohol/week: 0.0 standard drinks  . Drug use: No  . Sexual activity: Not on file  Lifestyle  . Physical activity:    Days per week: Not on file    Minutes per session: Not on file  . Stress: Not on file  Relationships  . Social connections:    Talks on phone: Not on file    Gets together: Not on file    Attends religious service: Not on file    Active member of club or organization: Not on file    Attends meetings of clubs or organizations: Not on file    Relationship status: Not on file  . Intimate partner violence:    Fear of current or ex partner: Not on file    Emotionally abused: Not on file    Physically abused: Not on file    Forced sexual activity: Not on file  Other Topics Concern  . Not on file  Social History Narrative   Lives in the home with his wife   Sees [place] every 6 month for meds.   Denies asbestos exposure    Family History    Problem Relation Age of Onset  . Other Mother        gun shot    ROS: no fevers or chills, productive cough, hemoptysis, dysphasia, odynophagia, melena, hematochezia, dysuria, hematuria, rash, seizure activity, orthopnea, PND, pedal edema, claudication. Remaining systems are negative.  Physical Exam: Well-developed chronically ill appearing no acute distress.  Skin is warm and dry.  HEENT is normal.  Neck is supple.  Chest with diminished BS throughout Cardiovascular exam is regular rate and rhythm.  Abdominal exam nontender or distended. No masses palpated. Extremities show trace edema. neuro grossly intact  ECG-sinus rhythm with occasional PAC, left bundle branch block.  Personally reviewed  A/P  1 coronary artery disease-patient denies chest pain.  Continue medical therapy with statin.  No aspirin given need for anticoagulation.  2 paroxysmal atrial fibrillation-patient remains in sinus rhythm.  Continue present dose of beta-blocker for rate control if atrial fibrillation recurs.  Amiodarone discontinued previously because of baseline lung disease.  Continue apixaban.  Note previous episode of atrial fibrillation occurred in the setting of urosepsis.  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  4 cardiomyopathy-continue beta-blocker and ARB.  Increase losartan to 50 mg daily.  Check potassium and renal function in 1 week.  5 chronic combined systolic/diastolic congestive heart failure-he is euvolemic on examination.  Continue fluid restriction and low-sodium diet.  6 hyperlipidemia-continue statin.  7 hypertension-blood pressure is mildly elevated.  Increase losartan to 50 mg daily and follow.  8 history of near syncope-patient did have a follow-up monitor and no arrhythmia identified.  Olga Millers, MD

## 2018-09-08 ENCOUNTER — Encounter: Payer: Self-pay | Admitting: Cardiology

## 2018-09-08 ENCOUNTER — Ambulatory Visit: Payer: Medicare Other | Admitting: Cardiology

## 2018-09-08 VITALS — BP 144/78 | HR 54 | Ht 73.0 in | Wt 213.0 lb

## 2018-09-08 DIAGNOSIS — E7800 Pure hypercholesterolemia, unspecified: Secondary | ICD-10-CM | POA: Diagnosis not present

## 2018-09-08 DIAGNOSIS — I2510 Atherosclerotic heart disease of native coronary artery without angina pectoris: Secondary | ICD-10-CM

## 2018-09-08 DIAGNOSIS — I480 Paroxysmal atrial fibrillation: Secondary | ICD-10-CM

## 2018-09-08 DIAGNOSIS — I10 Essential (primary) hypertension: Secondary | ICD-10-CM

## 2018-09-08 MED ORDER — LOSARTAN POTASSIUM 50 MG PO TABS: 50.0000 mg | ORAL_TABLET | Freq: Every day | ORAL | 3 refills | Status: DC

## 2018-09-08 NOTE — Patient Instructions (Signed)
Medication Instructions:   INCREASE LOSARTAN TO 50 MG ONCE DAILY  Labwork:  Your physician recommends that you return for lab work IN ONE WEEK  Follow-Up:  Your physician wants you to follow-up in: 6 MONTHS WITH DR Jens Som You will receive a reminder letter in the mail two months in advance. If you don't receive a letter, please call our office to schedule the follow-up appointment.

## 2018-09-24 LAB — BASIC METABOLIC PANEL
BUN/Creatinine Ratio: 22 (ref 10–24)
BUN: 27 mg/dL (ref 8–27)
CO2: 24 mmol/L (ref 20–29)
Calcium: 9.3 mg/dL (ref 8.6–10.2)
Chloride: 102 mmol/L (ref 96–106)
Creatinine, Ser: 1.23 mg/dL (ref 0.76–1.27)
GFR calc Af Amer: 65 mL/min/{1.73_m2} (ref >59)
GFR calc non Af Amer: 56 mL/min/{1.73_m2} — ABNORMAL LOW (ref >59)
Glucose: 121 mg/dL — ABNORMAL HIGH (ref 65–99)
Potassium: 4.5 mmol/L (ref 3.5–5.2)
Sodium: 142 mmol/L (ref 134–144)

## 2018-09-30 ENCOUNTER — Encounter: Payer: Self-pay | Admitting: Adult Health

## 2018-09-30 ENCOUNTER — Ambulatory Visit (INDEPENDENT_AMBULATORY_CARE_PROVIDER_SITE_OTHER): Payer: Medicare Other | Admitting: Adult Health

## 2018-09-30 DIAGNOSIS — G4733 Obstructive sleep apnea (adult) (pediatric): Secondary | ICD-10-CM | POA: Diagnosis not present

## 2018-09-30 DIAGNOSIS — I5022 Chronic systolic (congestive) heart failure: Secondary | ICD-10-CM | POA: Diagnosis not present

## 2018-09-30 DIAGNOSIS — J9611 Chronic respiratory failure with hypoxia: Secondary | ICD-10-CM

## 2018-09-30 DIAGNOSIS — R918 Other nonspecific abnormal finding of lung field: Secondary | ICD-10-CM

## 2018-09-30 DIAGNOSIS — J449 Chronic obstructive pulmonary disease, unspecified: Secondary | ICD-10-CM

## 2018-09-30 DIAGNOSIS — Z9989 Dependence on other enabling machines and devices: Secondary | ICD-10-CM

## 2018-09-30 NOTE — Assessment & Plan Note (Signed)
Cont on O2 .  

## 2018-09-30 NOTE — Assessment & Plan Note (Signed)
Severe sleep apnea well-controlled on CPAP.  Patient is in need of a new CPAP machine.  Order has been sent.  Patient has excellent compliance history and feels significant benefit from his CPAP. Patient education on sleep apnea and CPAP usage.

## 2018-09-30 NOTE — Assessment & Plan Note (Signed)
Well controlled  Cont follow up with cards

## 2018-09-30 NOTE — Patient Instructions (Signed)
Continue on Spiriva and Striverdi daily , rinse after use .  Continue on Oxygen 2l/m Continue on CPAP At bedtime  With oxygen .  Order for new CPAP At bedtime   Wear each night for at least 6 hr each night .  Plan for CT chest in 03/2019 .  Follow up in 3 months with Luv Mish NP or Dr. Vassie Loll  Please contact office for sooner follow up if symptoms do not improve or worsen or seek emergency care

## 2018-09-30 NOTE — Assessment & Plan Note (Signed)
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  Patient seems to be doing very well despite his multiple comorbidities. He is to continue on his current regimen.  Activity as tolerated.  Plan  Patient Instructions  Continue on Spiriva and Striverdi daily , rinse after use .  Continue on Oxygen 2l/m Continue on CPAP At bedtime  With oxygen .  Order for new CPAP At bedtime   Wear each night for at least 6 hr each night .  Plan for CT chest in 03/2019 .  Follow up in 3 months with [place] NP or Dr. Vassie Loll  Please contact office for sooner follow up if symptoms do not improve or worsen or seek emergency care

## 2018-09-30 NOTE — Assessment & Plan Note (Signed)
Abnormal CT chest dating back to June 2018 with serial follow-up showing decreased parenchymal band in the anterior left upper lobe slightly decreased in thickness.  Stable right fibrothorax and bibasilar pleural-parenchymal scarring in the right lung.  Stable nodularity.  Felt to be compatible with postinflammatory changes.  We will continue with serial follow-up he is due for CT chest in September 2020.

## 2018-09-30 NOTE — Progress Notes (Signed)
@Patient  ID: [name], male    DOB: 21-Dec-1940, 78 y.o.   MRN: [id number]  Chief Complaint  Patient presents with  . 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  PFT 10/2015 FEV1 was 36%, ratio 46, FVC 58%, DLCO 26% Echo 08/2016 >EF 35-40%, s/p stent , Echo 08/2017 40-45%, gr 2 DD  CXR 11/2016 LUL PNA ,  CT chest 12/2016 -masslike consolidation in LUL  CT chest 03/12/17 >stable masslike consolidation LUL 3.9cm , LUL 7 mm nodule  PET scan 03/20/17 >+metabolic act in LUL consolidation CT chest February 2019>nodular left-sided airspace opacities have improved from prior study. CT angio chest January 25, 2018>advanced emphysema, scarring of the left upper lobe, nodules of the left lower lobe are unchanged. Scattered scarring in the right lung is unchanged. CT chest September 2019 > several pulmonary nodules scattered throughout the left lung stable since February 2019 decreased from 05/2017 most compatible with stable postinflammatory nodularity with recommendations for follow-up CT chest in 12 months.,  Thick parenchymal band in the anterior left upper lobe slightly decreased in thickness, stable right fibrothorax with extensive basilar pleural-parenchymal scarring in the right lung, severe emphysema  08/2017 admitted with Sepsis [name]  12/2017 Admitted with GIB , anemia 02/2018 Hematology  following chronic anemia   09/30/2018 Follow up: COPD , O2 RF , Lung nodules , OSA  Patient returns for a 57-month follow-up.  [name] has severe COPD that is oxygen dependent.  He has been doing well over the last few months.  He denies any flare of cough or wheezing.  He gets winded with heavy activity.  He is the caregiver for his wife who has severe COPD on home hospice.  He remains on Spiriva and Striverdi .  He does get his inhalers to the [place] system.  Says since last visit he feels he has been doing better handling his stress better.  He denies any chest pain, orthopnea edema or fever.  He is on oxygen 2 L.  O2 saturations have been doing well.  He denies any change in oxygen demands.  Patient has known pulmonary nodules with abnormal CT scan with scarring.  Most recent CT chest September 2019 showed stable pulmonary nodules.  He has a planned follow-up CT scan in September 2020.  Immunizations are up-to-date with flu shot, Prevnar and Pneumovax.  Patient has sleep apnea on CPAP at bedtime.  Patient says he never misses a night wears it every night.  He feels that he benefits from this and does not have any significant daytime sleepiness.  Gets in about 7 hours each night.  Patient's machine is getting old.  Unable to get CPAP download.  Says he feels it is time to get a new machine as this 1 is starting to make noises.  Uses SoClean , says it works okay.   Patient has history of congestive heart failure.  He is followed by cardiology.  Says that lower extremity edema has been doing exceptionally well lately.  Has had no increased edema.  Allergies  Allergen Reactions  . No Known Allergies     Immunization History  Administered Date(s) Administered  . Influenza Split 04/14/2011, 04/27/2013  . Influenza Whole 05/28/2009, 04/27/2010, 03/31/2012  . Influenza, High Dose Seasonal PF 03/21/2015, 05/16/2017, 04/08/2018  . Influenza,inj,Quad PF,6+ Mos 03/17/2014, 04/04/2016  .  Influenza-Unspecified 05/26/2011, 05/07/2012  . Pneumococcal Conjugate-13 09/20/2013  . Pneumococcal Polysaccharide-23 05/28/2008, 04/25/2010  . Pneumococcal-Unspecified 04/27/2013  . Td 11/23/2009  . Zoster 04/28/2009    Past Medical History:  Diagnosis Date  . 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 Anxiety   . Aortic aneurysm (HCC) 09/02/2017  . BENIGN PROSTATIC HYPERTROPHY 11/23/2009  . Cardiomyopathy (HCC) 08/28/2016  . Chronic systolic CHF (congestive heart failure) (HCC) 09/02/2017  . COPD (chronic obstructive pulmonary disease) (HCC)   . CORONARY ARTERY DISEASE 11/23/2009  . 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 DEGENERATIVE JOINT DISEASE 11/23/2009  . DEPRESSION 11/23/2009  . FATIGUE 11/23/2009  . GAIT DISTURBANCE 12/10/2009  . HEMOPTYSIS UNSPECIFIED 05/07/2010  . High cholesterol   . HYPERTENSION 07/30/2009  . HYPOTHYROIDISM 07/30/2009  . Ischemic cardiomyopathy 09/02/2017  . 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 On home oxygen therapy    "2-3L; 24/7" (09/10/2016)  . OSA on CPAP   . PTSD (post-traumatic stress disorder) 03/10/2012  . 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 RA (rheumatoid arthritis) (HCC) 06/11/2011   "qwhere" (09/10/2016)  . RESPIRATORY FAILURE, CHRONIC 07/31/2009  . Scleritis of both eyes 03/17/2014  . 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 Type II diabetes mellitus (HCC)     Tobacco History: Social History   Tobacco Use  Smoking Status Former Smoker  . Packs/day: 2.50  . Years: 40.00  . Pack years: 100.00  . Types: Cigarettes, Pipe, Cigars  . Last attempt to quit: 07/28/1998  . Years since quitting: 20.1  Smokeless Tobacco Never Used   Counseling given: Not Answered   Outpatient Medications Prior to Visit  Medication Sig Dispense Refill  . albuterol (PROVENTIL) (2.5 MG/3ML) 0.083% nebulizer solution 1 vial in nebulizer every 6 hours as needed Dx 496 (Patient taking differently: Take 2.5 mg by nebulization every 6 (six) hours as needed for wheezing or shortness of  breath. 1 vial in nebulizer every 6 hours as needed Dx 496) 120 mL 6  . ALPRAZolam (XANAX) 0.5 MG tablet Take 0.5 mg by mouth at bedtime.     [name]. apixaban (ELIQUIS) 5 MG TABS tablet Take 1 tablet (5 mg total) by mouth 2 (two) times daily. 180 tablet 3  . atorvastatin (LIPITOR) 40 MG tablet Take 1 tablet (40 mg total) by mouth daily. 90 tablet 3  . Carboxymethylcellul-Glycerin (LUBRICATING EYE DROPS OP) Apply 1 drop to eye daily as needed (dry eyes).    . carvedilol (COREG) 3.125 MG tablet Take 1 tablet (3.125 mg total) by mouth daily. 180 tablet 0  . Cholecalciferol (VITAMIN D3) 2000 units capsule Take 2,000 Units by mouth daily.     . insulin glargine (LANTUS) 100 UNIT/ML injection Inject 0.4 mLs (40 Units total) into the skin at bedtime. (Patient taking differently: Inject 50 Units into the skin at bedtime. ) 10 mL 0  . levothyroxine (SYNTHROID, LEVOTHROID) 137 MCG tablet Take 137 mcg by mouth daily before breakfast.    . losartan (COZAAR) 50 MG tablet Take 1 tablet (50 mg total) by mouth daily. 90 tablet 3  .  metFORMIN (GLUCOPHAGE) 500 MG tablet Take 500 mg by mouth 2 (two) times daily with a meal.    . Multiple Vitamins-Minerals (MEGA MULTIVITAMIN FOR MEN PO) Take 1 tablet by mouth daily.     . Olodaterol HCl (STRIVERDI RESPIMAT) 2.5 MCG/ACT AERS Inhale 1 puff into the lungs daily.    . OXYGEN Inhale 2 L into the lungs continuous.    . pregabalin (LYRICA) 50 MG capsule Take 50 mg by mouth 2 (two) times daily.    . Semaglutide (OZEMPIC) 1 MG/DOSE SOPN Inject 1 mg into the skin once a week.    . tamsulosin (FLOMAX) 0.4 MG CAPS capsule Take 1 capsule (0.4 mg total) by mouth at bedtime. 30 capsule 0  . tiotropium (SPIRIVA HANDIHALER) 18 MCG inhalation capsule Place 1 capsule (18 mcg total) into inhaler and inhale daily. 30 capsule 4   No facility-administered medications prior to visit.      Review of Systems:   Constitutional:   No  weight loss, night sweats,  Fevers, chills, + fatigue, or   lassitude.  HEENT:   No headaches,  Difficulty swallowing,  Tooth/dental problems, or  Sore throat,                No sneezing, itching, ear ache, nasal congestion, post nasal drip,   CV:  No chest pain,  Orthopnea, PND, swelling in lower extremities, anasarca, dizziness, palpitations, syncope.   GI  No heartburn, indigestion, abdominal pain, nausea, vomiting, diarrhea, change in bowel habits, loss of appetite, bloody stools.   Resp:  No excess mucus, no productive cough,  No non-productive cough,  No coughing up of blood.  No change in color of mucus.  No wheezing.  No chest wall deformity  Skin: no rash or lesions.  GU: no dysuria, change in color of urine, no urgency or frequency.  No flank pain, no hematuria   MS:  No joint pain or swelling.  No decreased range of motion.  No back pain.    Physical Exam  BP (!) 152/74   Pulse 63   Ht 6\' 1"  (1.854 m)   Wt 209 lb (94.8 kg)   SpO2 97%   BMI 27.57 kg/m   GEN: A/Ox3; pleasant , NAD, elderly, chronically ill-appearing, on oxygen   HEENT:  [place]/AT,  EACs-clear, TMs-wnl, NOSE-clear, THROAT-clear, no lesions, no postnasal drip or exudate noted.  Dry oral mucosa  NECK:  Supple w/ fair ROM; no JVD; normal carotid impulses w/o bruits; no thyromegaly or nodules palpated; no lymphadenopathy.    RESP diminished breath sounds in the bases . no accessory muscle use, no dullness to percussion  CARD:  RRR, no m/r/g, no peripheral edema, pulses intact, no cyanosis or clubbing.  GI:   Soft & nt; nml bowel sounds; no organomegaly or masses detected.   Musco: Warm bil, no deformities or joint swelling noted.   Neuro: alert, no focal deficits noted.    Skin: Warm, no lesions or rashes    Lab Results:  CBC    Component Value Date/Time   WBC 4.3 07/15/2018 1059   WBC 4.4 05/27/2018 0913   RBC 3.96 (L) 07/15/2018 1059   HGB 10.6 (L) 07/15/2018 1059   HGB 11.0 (L) 12/23/2016 1053   HCT 34.2 (L) 07/15/2018 1059   HCT 29.6 (L)  01/23/2018 0830   PLT 63 (L) 07/15/2018 1059   PLT 125 (L) 12/23/2016 1053   MCV 86.4 07/15/2018 1059   MCV 84 12/23/2016 1053   MCH  26.8 07/15/2018 1059   MCHC 31.0 07/15/2018 1059   RDW 15.2 07/15/2018 1059   RDW 15.6 (H) 12/23/2016 1053   LYMPHSABS 1.4 07/15/2018 1059   LYMPHSABS 1.2 12/23/2016 1053   MONOABS 0.6 07/15/2018 1059   EOSABS 0.3 07/15/2018 1059   EOSABS 0.3 12/23/2016 1053   BASOSABS 0.0 07/15/2018 1059   BASOSABS 0.0 12/23/2016 1053    BMET    Component Value Date/Time   NA 142 09/23/2018 0955   K 4.5 09/23/2018 0955   CL 102 09/23/2018 0955   CO2 24 09/23/2018 0955   GLUCOSE 121 (H) 09/23/2018 0955   GLUCOSE 114 (H) 07/15/2018 1059   BUN 27 09/23/2018 0955   CREATININE 1.23 09/23/2018 0955   CREATININE 1.12 07/15/2018 1059   CALCIUM 9.3 09/23/2018 0955   GFRNONAA 56 (L) 09/23/2018 0955   GFRNONAA >60 07/15/2018 1059   GFRAA 65 09/23/2018 0955   GFRAA >60 07/15/2018 1059    BNP No results found for: BNP  ProBNP    Component Value Date/Time   PROBNP 188 10/20/2016 1038   PROBNP 59.9 05/04/2011 0517    Imaging: No results found.    PFT Results Latest Ref Rng & Units 11/16/2015 12/30/2013  FVC-Pre L 2.96 3.01  FVC-Predicted Pre % 59 59  FVC-Post L 2.89 3.01  FVC-Predicted Post % 58 59  Pre FEV1/FVC % % 43 45  Post FEV1/FCV % % 46 49  FEV1-Pre L 1.28 1.36  FEV1-Predicted Pre % 35 36  FEV1-Post L 1.33 1.48  DLCO UNC% % 26 26  DLCO COR %Predicted % 40 39  TLC L - 6.31  TLC % Predicted % - 80  RV % Predicted % - 80    No results found for: NITRICOXIDE      Assessment & Plan:   COPD (chronic obstructive pulmonary disease) (HCC) Severe COPD that is oxygen dependent.  Patient seems to be doing very well despite his multiple comorbidities. He is to continue on his current regimen.  Activity as tolerated.  Plan  Patient Instructions  Continue on Spiriva and Striverdi daily , rinse after use .  Continue on Oxygen 2l/m Continue on  CPAP At bedtime  With oxygen .  Order for new CPAP At bedtime   Wear each night for at least 6 hr each night .  Plan for CT chest in 03/2019 .  Follow up in 3 months with [place] NP or Dr. Vassie Loll  Please contact office for sooner follow up if symptoms do not improve or worsen or seek emergency care       OSA on CPAP Severe sleep apnea well-controlled on CPAP.  Patient is in need of a new CPAP machine.  Order has been sent.  Patient has excellent compliance history and feels significant benefit from his CPAP. Patient education on sleep apnea and CPAP usage.   Chronic systolic CHF (congestive heart failure) (HCC) Well controlled  Cont follow up with cards   Chronic respiratory failure with hypoxia (HCC) Cont on O2 .   Lung mass Abnormal CT chest dating back to June 2018 with serial follow-up showing decreased parenchymal band in the anterior left upper lobe slightly decreased in thickness.  Stable right fibrothorax and bibasilar pleural-parenchymal scarring in the right lung.  Stable nodularity.  Felt to be compatible with postinflammatory changes.  We will continue with serial follow-up he is due for CT chest in September 2020.     Rubye Oaks, NP 09/30/2018

## 2018-11-01 ENCOUNTER — Telehealth: Payer: Self-pay

## 2018-11-01 NOTE — Telephone Encounter (Signed)
LVM for patient to call back and let us know if he is wanting to push his appointment back a couple months or if they wanted a virtual video visit as we are not seeing patients in our office currently

## 2018-11-02 ENCOUNTER — Ambulatory Visit: Payer: Medicare Other | Admitting: Internal Medicine

## 2018-11-08 ENCOUNTER — Telehealth: Payer: Self-pay | Admitting: Hematology and Oncology

## 2018-11-08 NOTE — Telephone Encounter (Signed)
R/s appt per 4/9 sch message - unable to reach patient and unable to leave message - sent reminder letter in the mail with new appt date and time

## 2018-11-15 ENCOUNTER — Ambulatory Visit: Payer: Medicare Other | Admitting: Hematology and Oncology

## 2018-11-15 ENCOUNTER — Other Ambulatory Visit: Payer: Medicare Other

## 2018-12-15 ENCOUNTER — Other Ambulatory Visit: Payer: Self-pay | Admitting: Cardiology

## 2018-12-15 DIAGNOSIS — I5022 Chronic systolic (congestive) heart failure: Secondary | ICD-10-CM

## 2018-12-15 MED ORDER — CARVEDILOL 3.125 MG PO TABS: 3.1250 mg | ORAL_TABLET | Freq: Every day | ORAL | 2 refills | Status: DC

## 2018-12-15 NOTE — Telephone Encounter (Signed)
Carvedilol refilled.

## 2018-12-15 NOTE — Telephone Encounter (Signed)
°*  STAT* If patient is at the pharmacy, call can be transferred to refill team.   1. Which medications need to be refilled? (please list name of each medication and dose if known) Carvedilol 3.125mg  tablets twice daily  2. Which pharmacy/location (including street and city if local pharmacy) is medication to be sent to? [facility] [address] [place]  3. Do they need a 30 day or 90 day supply? 180

## 2018-12-21 NOTE — Assessment & Plan Note (Deleted)
02/18/2018: Hemoglobin 8.6, MCV 83, WBC 3.5, platelets 86 creatinine 1.83 02/26/2018: Hemoglobin 9.1, platelets 92, MCV 85 03/11/2018: Hemoglobin 9.5, platelets 75, MCV 84, WBC 3 04/15/2018: Hemoglobin is 10.3, platelets 75 WBC 3.8 07/15/2018: Hemoglobin 10.6, platelets 63, WBC 4.3  I reviewed the lab work with the patient   Leukopenia: Resolved Thrombocytopenia:

## 2018-12-23 ENCOUNTER — Other Ambulatory Visit: Payer: Self-pay

## 2018-12-24 ENCOUNTER — Inpatient Hospital Stay: Payer: Medicare Other | Attending: Hematology and Oncology

## 2018-12-24 ENCOUNTER — Inpatient Hospital Stay: Payer: Medicare Other | Admitting: Hematology and Oncology

## 2018-12-27 ENCOUNTER — Telehealth: Payer: Self-pay | Admitting: Hematology and Oncology

## 2018-12-27 NOTE — Telephone Encounter (Signed)
Scheduled appt per sch msg. Called and left msg for patient.  °

## 2019-01-02 NOTE — Progress Notes (Signed)
Patient Care Team: Myrlene Broker, MD as PCP - General (Internal Medicine) Ernesto Rutherford, MD as Consulting Physician (Ophthalmology) Kalman Shan, MD as Consulting Physician (Pulmonary Disease) System, Provider Not In as Consulting Physician Casimer Lanius, MD as Consulting Physician (Rheumatology) Oretha Milch, MD as Consulting Physician (Pulmonary Disease) Lewayne Bunting, MD as Consulting Physician (Cardiology)  DIAGNOSIS:    ICD-10-CM   1. Thrombocytopenia (HCC) D69.6 Immature Platelet Fraction    CBC with Differential (Cancer Center Only)    Immature Platelet Fraction  2. Symptomatic anemia D64.9     CHIEF COMPLIANT: Follow-up of severe anemia  INTERVAL HISTORY: [name] is a 78 y.o. with above-mentioned history of severe anemia due to chronic kidney disease. He presents to the clinic today for follow-up of his blood work.  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  I suspect it is related to his blood pressure.  He has fallen a few times but not had any bruising or bleeding.  REVIEW OF SYSTEMS:   Constitutional: Generalized weakness Eyes: Denies blurriness of vision Ears, nose, mouth, throat, and face: Denies mucositis or sore throat Respiratory: Oxygen by nasal cannula Cardiovascular: Denies palpitation, chest discomfort Gastrointestinal: Denies nausea, heartburn or change in bowel habits Skin: Denies abnormal skin rashes Lymphatics: Denies new lymphadenopathy or easy bruising Neurological: Denies numbness, tingling or new weaknesses Behavioral/Psych: Mood is stable, no new changes  Extremities: No lower extremity edema All other systems were reviewed with the patient and are negative.  I have reviewed the past medical history, past surgical history, social history and family history with the patient and they are unchanged from previous note.  ALLERGIES:  is allergic to no known allergies.  MEDICATIONS:   Current Outpatient Medications  Medication Sig Dispense Refill  . albuterol (PROVENTIL) (2.5 MG/3ML) 0.083% nebulizer solution 1 vial in nebulizer every 6 hours as needed Dx 496 (Patient taking differently: Take 2.5 mg by nebulization every 6 (six) hours as needed for wheezing or shortness of breath. 1 vial in nebulizer every 6 hours as needed Dx 496) 120 mL 6  . ALPRAZolam (XANAX) 0.5 MG tablet Take 0.5 mg by mouth at bedtime.     [name] apixaban (ELIQUIS) 5 MG TABS tablet Take 1 tablet (5 mg total) by mouth 2 (two) times daily. 180 tablet 3  . atorvastatin (LIPITOR) 40 MG tablet Take 1 tablet (40 mg total) by mouth daily. 90 tablet 3  . Carboxymethylcellul-Glycerin (LUBRICATING EYE DROPS OP) Apply 1 drop to eye daily as needed (dry eyes).    . carvedilol (COREG) 3.125 MG tablet Take 1 tablet (3.125 mg total) by mouth daily. 180 tablet 2  . Cholecalciferol (VITAMIN D3) 2000 units capsule Take 2,000 Units by mouth daily.     . insulin glargine (LANTUS) 100 UNIT/ML injection Inject 0.4 mLs (40 Units total) into the skin at bedtime. (Patient taking differently: Inject 50 Units into the skin at bedtime. ) 10 mL 0  . levothyroxine (SYNTHROID, LEVOTHROID) 137 MCG tablet Take 137 mcg by mouth daily before breakfast.    . losartan (COZAAR) 50 MG tablet Take 1 tablet (50 mg total) by mouth daily. 90 tablet 3  . metFORMIN (GLUCOPHAGE) 500 MG tablet Take 500 mg by mouth 2 (two) times daily with a meal.    . Multiple Vitamins-Minerals (MEGA MULTIVITAMIN FOR MEN PO) Take 1 tablet by mouth daily.     . Olodaterol HCl (STRIVERDI RESPIMAT) 2.5 MCG/ACT AERS Inhale 1  puff into the lungs daily.    . OXYGEN Inhale 2 L into the lungs continuous.    . pregabalin (LYRICA) 50 MG capsule Take 50 mg by mouth 2 (two) times daily.    . Semaglutide (OZEMPIC) 1 MG/DOSE SOPN Inject 1 mg into the skin once a week.    . tamsulosin (FLOMAX) 0.4 MG CAPS capsule Take 1 capsule (0.4 mg total) by mouth at bedtime. 30 capsule 0  .  tiotropium (SPIRIVA HANDIHALER) 18 MCG inhalation capsule Place 1 capsule (18 mcg total) into inhaler and inhale daily. 30 capsule 4   No current facility-administered medications for this visit.     PHYSICAL EXAMINATION: ECOG PERFORMANCE STATUS: 1 - Symptomatic but completely ambulatory  Vitals:   01/03/19 0955  BP: (!) 169/80  Pulse: 62  Resp: 17  Temp: 98.2 F (36.8 C)  SpO2: 100%   Filed Weights   01/03/19 0955  Weight: 216 lb 8 oz (98.2 kg)    GENERAL: alert, no distress and comfortable SKIN: skin color, texture, turgor are normal, no rashes or significant lesions EYES: normal, Conjunctiva are pink and non-injected, sclera clear OROPHARYNX: no exudate, no erythema and lips, buccal mucosa, and tongue normal  NECK: supple, thyroid normal size, non-tender, without nodularity LYMPH: no palpable lymphadenopathy in the cervical, axillary or inguinal LUNGS: clear to auscultation and percussion with normal breathing effort HEART: regular rate & rhythm and no murmurs and no lower extremity edema ABDOMEN: abdomen soft, non-tender and normal bowel sounds MUSCULOSKELETAL: no cyanosis of digits and no clubbing  NEURO: alert & oriented x 3 with fluent speech, no focal motor/sensory deficits EXTREMITIES: No lower extremity edema  LABORATORY DATA:  I have reviewed the data as listed CMP Latest Ref Rng & Units 09/23/2018 07/15/2018 05/27/2018  Glucose 65 - 99 mg/dL 121(H) 114(H) 95  BUN 8 - 27 mg/dL 27 18 22   Creatinine 0.76 - 1.27 mg/dL 1.23 1.12 1.27  Sodium 134 - 144 mmol/L 142 142 142  Potassium 3.5 - 5.2 mmol/L 4.5 4.5 4.6  Chloride 96 - 106 mmol/L 102 107 107  CO2 20 - 29 mmol/L 24 26 27   Calcium 8.6 - 10.2 mg/dL 9.3 9.5 9.4  Total Protein 6.5 - 8.1 g/dL - 6.8 6.2  Total Bilirubin 0.3 - 1.2 mg/dL - 0.6 0.6  Alkaline Phos 38 - 126 U/L - 97 81  AST 15 - 41 U/L - 22 22  ALT 0 - 44 U/L - 15 18    Lab Results  Component Value Date   WBC 4.4 01/03/2019   HGB 11.3 (L)  01/03/2019   HCT 35.8 (L) 01/03/2019   MCV 88.6 01/03/2019   PLT 49 (L) 01/03/2019   NEUTROABS 2.3 01/03/2019    ASSESSMENT & PLAN:  Symptomatic anemia 02/18/2018: Hemoglobin 8.6, MCV 83, WBC 3.5, platelets 86 creatinine 1.83 02/26/2018: Hemoglobin 9.1, platelets 92, MCV 85 03/11/2018: Hemoglobin 9.5, platelets 75, MCV 84, WBC 3 04/15/2018: Hemoglobin is 10.3, platelets 75 WBC 3.8 07/15/2018: Hemoglobin 10.6, platelets 63, WBC 4.3 01/03/2019: Hemoglobin 11.3, platelets 49, WBC 4.4, normal differential  I reviewed the lab work with the patient   Leukopenia: Resolved 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 I performed the immature platelet fraction which was normal.  This suggests decreased production rather than increased destruction.  I discussed with the patient indications for treatment of ITP.  He does not have any signs or symptoms of bleeding. If his platelet count decreases any further he may require treatment. I will see him back  in 3 months with labs and follow-up.    Orders Placed This Encounter  Procedures  . Immature Platelet Fraction    Standing Status:   Future    Number of Occurrences:   1    Standing Expiration Date:   01/03/2020  . CBC with Differential (Cancer Center Only)    Standing Status:   Future    Standing Expiration Date:   01/03/2020  . Immature Platelet Fraction    Standing Status:   Future    Standing Expiration Date:   01/03/2020   The patient has a good understanding of the overall plan. he agrees with it. he will call with any problems that may develop before the next visit here.  Serena Croissant, MD 01/03/2019  Jenness Corner Dorshimer am acting as scribe for Dr. Serena Croissant.  I have reviewed the above documentation for accuracy and completeness, and I agree with the above.

## 2019-01-03 ENCOUNTER — Other Ambulatory Visit: Payer: Self-pay

## 2019-01-03 ENCOUNTER — Inpatient Hospital Stay: Payer: Medicare Other | Attending: Hematology and Oncology | Admitting: Hematology and Oncology

## 2019-01-03 ENCOUNTER — Inpatient Hospital Stay: Payer: Medicare Other

## 2019-01-03 VITALS — BP 169/80 | HR 62 | Temp 98.2°F | Resp 17 | Ht 73.0 in | Wt 216.5 lb

## 2019-01-03 DIAGNOSIS — Z794 Long term (current) use of insulin: Secondary | ICD-10-CM

## 2019-01-03 DIAGNOSIS — N189 Chronic kidney disease, unspecified: Secondary | ICD-10-CM | POA: Diagnosis not present

## 2019-01-03 DIAGNOSIS — R531 Weakness: Secondary | ICD-10-CM

## 2019-01-03 DIAGNOSIS — D631 Anemia in chronic kidney disease: Secondary | ICD-10-CM | POA: Diagnosis not present

## 2019-01-03 DIAGNOSIS — R42 Dizziness and giddiness: Secondary | ICD-10-CM | POA: Diagnosis not present

## 2019-01-03 DIAGNOSIS — Z7901 Long term (current) use of anticoagulants: Secondary | ICD-10-CM | POA: Diagnosis not present

## 2019-01-03 DIAGNOSIS — D696 Thrombocytopenia, unspecified: Secondary | ICD-10-CM | POA: Diagnosis present

## 2019-01-03 DIAGNOSIS — Z79899 Other long term (current) drug therapy: Secondary | ICD-10-CM | POA: Diagnosis not present

## 2019-01-03 DIAGNOSIS — D649 Anemia, unspecified: Secondary | ICD-10-CM

## 2019-01-03 LAB — CBC WITH DIFFERENTIAL (CANCER CENTER ONLY)
Abs Immature Granulocytes: 0.01 10*3/uL (ref 0.00–0.07)
Basophils Absolute: 0 10*3/uL (ref 0.0–0.1)
Basophils Relative: 1 %
Eosinophils Absolute: 0.2 10*3/uL (ref 0.0–0.5)
Eosinophils Relative: 5 %
HCT: 35.8 % — ABNORMAL LOW (ref 39.0–52.0)
Hemoglobin: 11.3 g/dL — ABNORMAL LOW (ref 13.0–17.0)
Immature Granulocytes: 0 %
Lymphocytes Relative: 29 %
Lymphs Abs: 1.3 10*3/uL (ref 0.7–4.0)
MCH: 28 pg (ref 26.0–34.0)
MCHC: 31.6 g/dL (ref 30.0–36.0)
MCV: 88.6 fL (ref 80.0–100.0)
Monocytes Absolute: 0.6 10*3/uL (ref 0.1–1.0)
Monocytes Relative: 13 %
Neutro Abs: 2.3 10*3/uL (ref 1.7–7.7)
Neutrophils Relative %: 52 %
Platelet Count: 49 10*3/uL — ABNORMAL LOW (ref 150–400)
RBC: 4.04 MIL/uL — ABNORMAL LOW (ref 4.22–5.81)
RDW: 14.6 % (ref 11.5–15.5)
WBC Count: 4.4 10*3/uL (ref 4.0–10.5)
nRBC: 0 % (ref 0.0–0.2)

## 2019-01-03 LAB — IMMATURE PLATELET FRACTION: Immature Platelet Fraction: 4.6 % (ref 1.2–8.6)

## 2019-01-03 NOTE — Assessment & Plan Note (Addendum)
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

## 2019-01-04 ENCOUNTER — Telehealth: Payer: Self-pay | Admitting: Hematology and Oncology

## 2019-01-04 ENCOUNTER — Ambulatory Visit: Payer: Medicare Other | Admitting: Adult Health

## 2019-01-04 NOTE — Telephone Encounter (Signed)
Talk with patient regarding schedule °

## 2019-01-18 ENCOUNTER — Other Ambulatory Visit: Payer: Self-pay | Admitting: Cardiology

## 2019-01-18 MED ORDER — APIXABAN 5 MG PO TABS: 5.0000 mg | ORAL_TABLET | Freq: Two times a day (BID) | ORAL | 2 refills | Status: DC

## 2019-01-18 NOTE — Telephone Encounter (Signed)
Rx has been sent to the pharmacy electronically. ° °

## 2019-01-18 NOTE — Telephone Encounter (Signed)
°*  STAT* If patient is at the pharmacy, call can be transferred to refill team.   1. Which medications need to be refilled? (please list name of each medication and dose if known) Eliquis 5mg  tablets one tablet twice daily  2. Which pharmacy/location (including street and city if local pharmacy) is medication to be sent to? [facility] [address] [place]  3. Do they need a 30 day or 90 day supply? [place]

## 2019-01-24 ENCOUNTER — Telehealth: Payer: Self-pay

## 2019-01-24 NOTE — Telephone Encounter (Signed)
LVM for patient to call back in regard to office visit with [place] tomorrow. Wanting to change visit to virtual visit or telephone call as with his age and medical history is at a higher chance of getting the carona virus. If still wanting to come in can as long as he is symptom free

## 2019-01-25 ENCOUNTER — Encounter: Payer: Self-pay | Admitting: Internal Medicine

## 2019-01-25 ENCOUNTER — Ambulatory Visit (INDEPENDENT_AMBULATORY_CARE_PROVIDER_SITE_OTHER): Payer: Medicare Other | Admitting: Internal Medicine

## 2019-01-25 ENCOUNTER — Other Ambulatory Visit: Payer: Self-pay

## 2019-01-25 VITALS — BP 130/82 | HR 61 | Temp 97.5°F | Ht 73.0 in | Wt 215.0 lb

## 2019-01-25 DIAGNOSIS — E118 Type 2 diabetes mellitus with unspecified complications: Secondary | ICD-10-CM | POA: Diagnosis not present

## 2019-01-25 DIAGNOSIS — D696 Thrombocytopenia, unspecified: Secondary | ICD-10-CM

## 2019-01-25 DIAGNOSIS — Z794 Long term (current) use of insulin: Secondary | ICD-10-CM | POA: Diagnosis not present

## 2019-01-25 DIAGNOSIS — I10 Essential (primary) hypertension: Secondary | ICD-10-CM

## 2019-01-25 NOTE — Assessment & Plan Note (Signed)
Reviewed recent labs with patient, he is aware of the declining numbers of platelets and oncology is monitoring.

## 2019-01-25 NOTE — Assessment & Plan Note (Signed)
[facility], taking insulin and ozempic and metformin and seems to be at goal per his reports. Complicated.

## 2019-01-25 NOTE — Patient Instructions (Signed)
We do not need to change any medicines today.

## 2019-01-25 NOTE — Progress Notes (Signed)
   Subjective:   Patient ID: [name], male    DOB: 1940/12/23, 78 y.o.   MRN: [id number]  HPI The patient is a 78 YO man coming in for follow up of his diabetes ([facility], thinks last Hga1c was in the 7s, taking insulin and denies low sugars, some sugars in the 200s after eating, denies new numbness or tingling in hands or feet), and blood pressure (taking coreg and losartan, BP at goal at home, rare lightheadedness with especially being outdoors, denies chest pains or change in SOB, denies new headaches), and anemia (he is concerned about the low platelets, his oncologist is monitoring closely, denies nosebleeds or blood in stool).   Review of Systems  Constitutional: Negative.   HENT: Negative.   Eyes: Negative.   Respiratory: Positive for shortness of breath. Negative for cough and chest tightness.        Stable, chronic  Cardiovascular: Negative for chest pain, palpitations and leg swelling.  Gastrointestinal: Negative for abdominal distention, abdominal pain, constipation, diarrhea, nausea and vomiting.  Musculoskeletal: Negative.   Skin: Negative.   Neurological: Negative.   Psychiatric/Behavioral: Negative.     Objective:  Physical Exam Constitutional:      Appearance: He is well-developed.  HENT:     Head: Normocephalic and atraumatic.  Neck:     Musculoskeletal: Normal range of motion.  Cardiovascular:     Rate and Rhythm: Normal rate and regular rhythm.  Pulmonary:     Effort: Pulmonary effort is normal. No respiratory distress.     Breath sounds: Normal breath sounds. No wheezing or rales.     Comments: Oxygen on, no dyspnea during conversation Abdominal:     General: Bowel sounds are normal. There is no distension.     Palpations: Abdomen is soft.     Tenderness: There is no abdominal tenderness. There is no rebound.  Skin:    General: Skin is warm and dry.  Neurological:     Mental Status: He is alert and oriented to person, place, and  time.     Coordination: Coordination normal.     Vitals:   01/25/19 0913  BP: 130/82  Pulse: 61  Temp: (!) 97.5 F (36.4 C)  TempSrc: Oral  SpO2: 96%  Weight: 215 lb (97.5 kg)  Height: 6\' 1"  (1.854 m)    Assessment & Plan:

## 2019-01-25 NOTE — Assessment & Plan Note (Signed)
BP at goal on coreg and losartan. Recent BMP reviewed with patient and is without indication for change.

## 2019-02-09 ENCOUNTER — Encounter: Payer: Self-pay | Admitting: *Deleted

## 2019-02-10 ENCOUNTER — Telehealth: Payer: Self-pay | Admitting: Adult Health

## 2019-02-10 NOTE — Telephone Encounter (Signed)
Will call patient to check on him. Patient is a [facility] patient. Per TP, the [facility] office will not be opened for a while due to [place]. Will check to see if he is doing ok and if he wants to make an appointment for August 2020 in [place].

## 2019-02-11 IMAGING — DX DG CHEST 2V
2 series · 3 of 3 positions shown · non-contrast
Comparison: PA and lateral chest x-ray March 03, 2016

CLINICAL DATA: Cough, chest congestion, and wheezing for the past 4
days. History of COPD, pulmonary fibrosis, former smoker, coronary
artery disease with stent placement.

EXAM:
CHEST  2 VIEW

[Series 1: chest pa · 0.14mm/px · 2 of 2 slices shown]
[im 1/2]
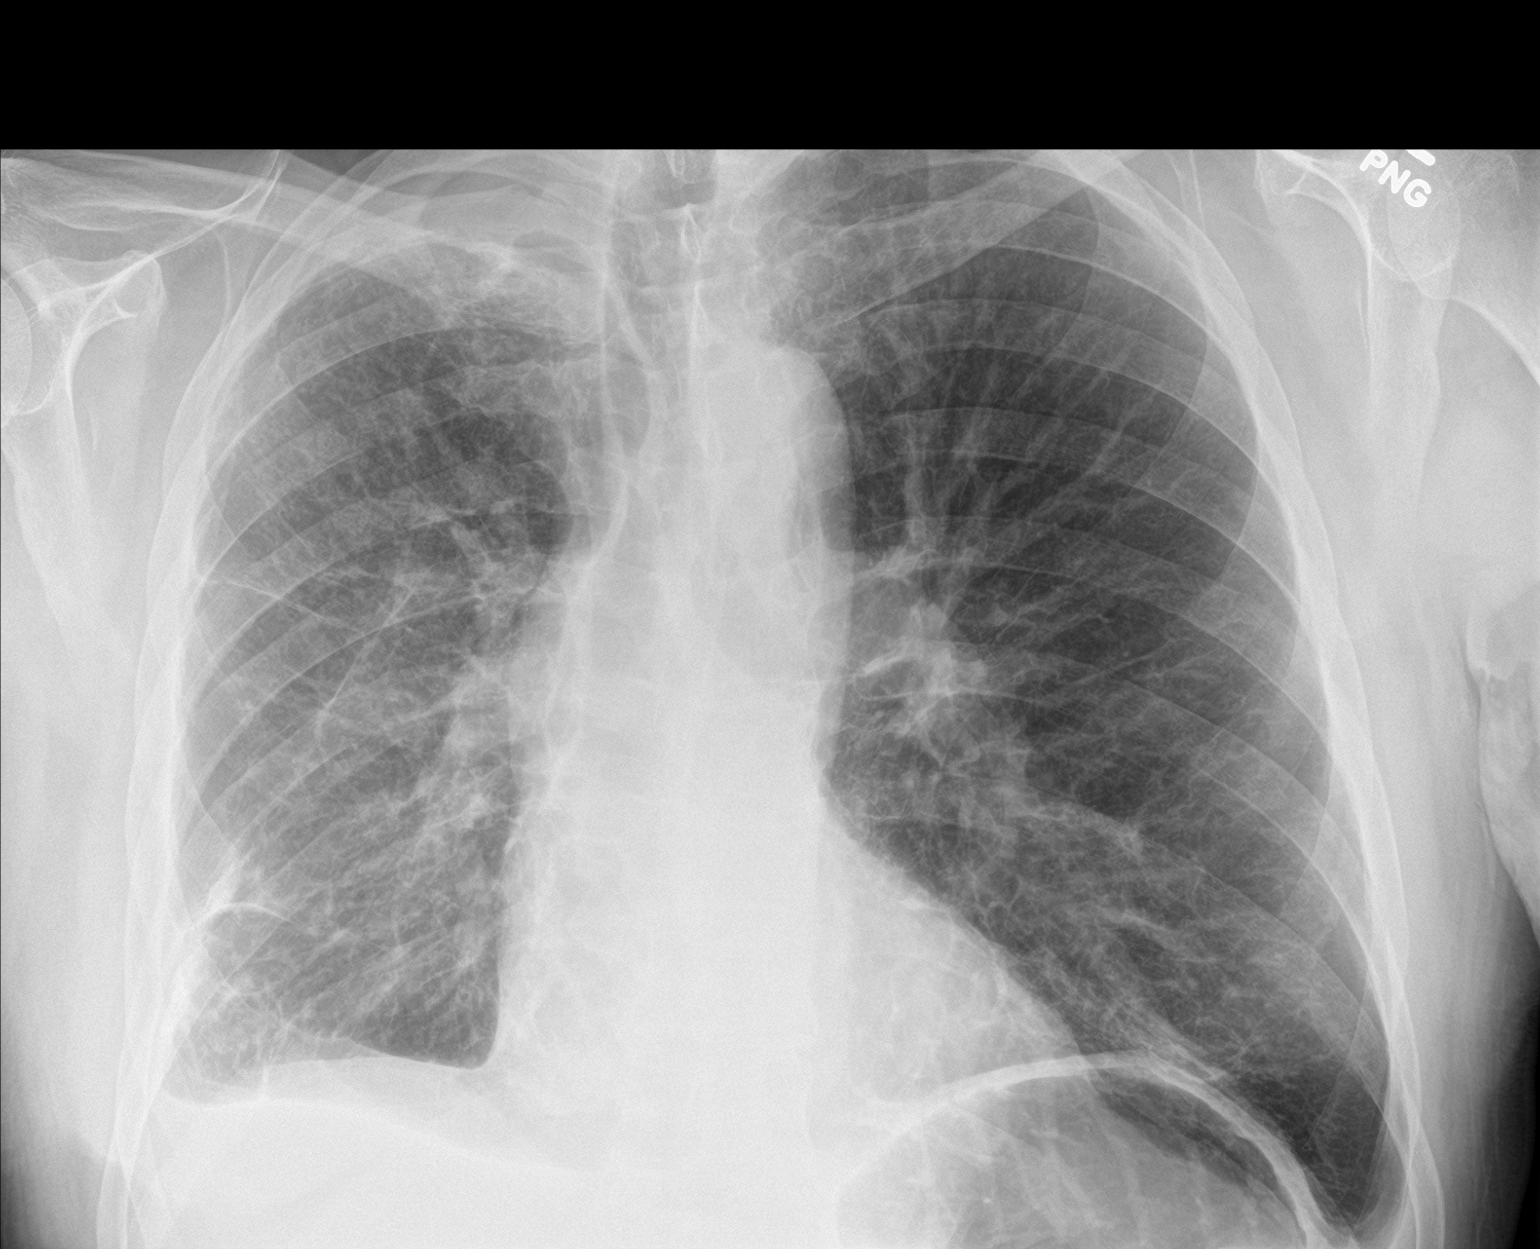
[im 2/2]
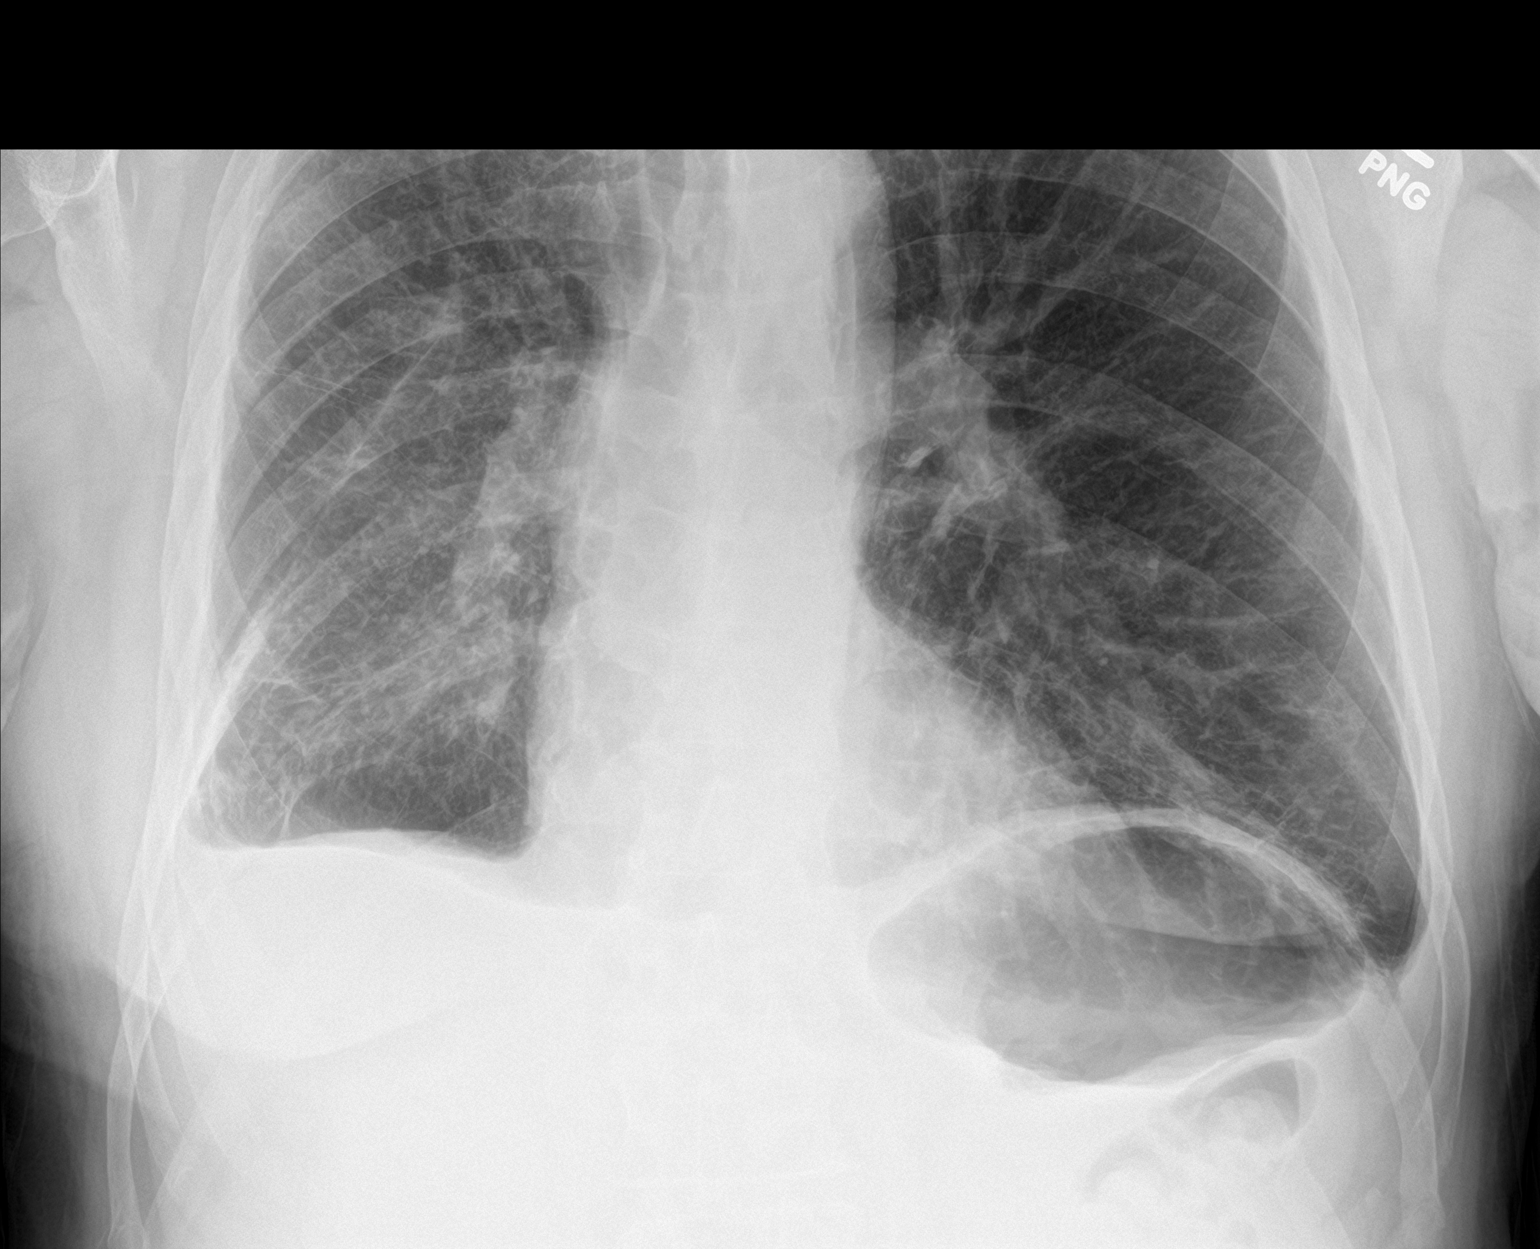

[chest lat]
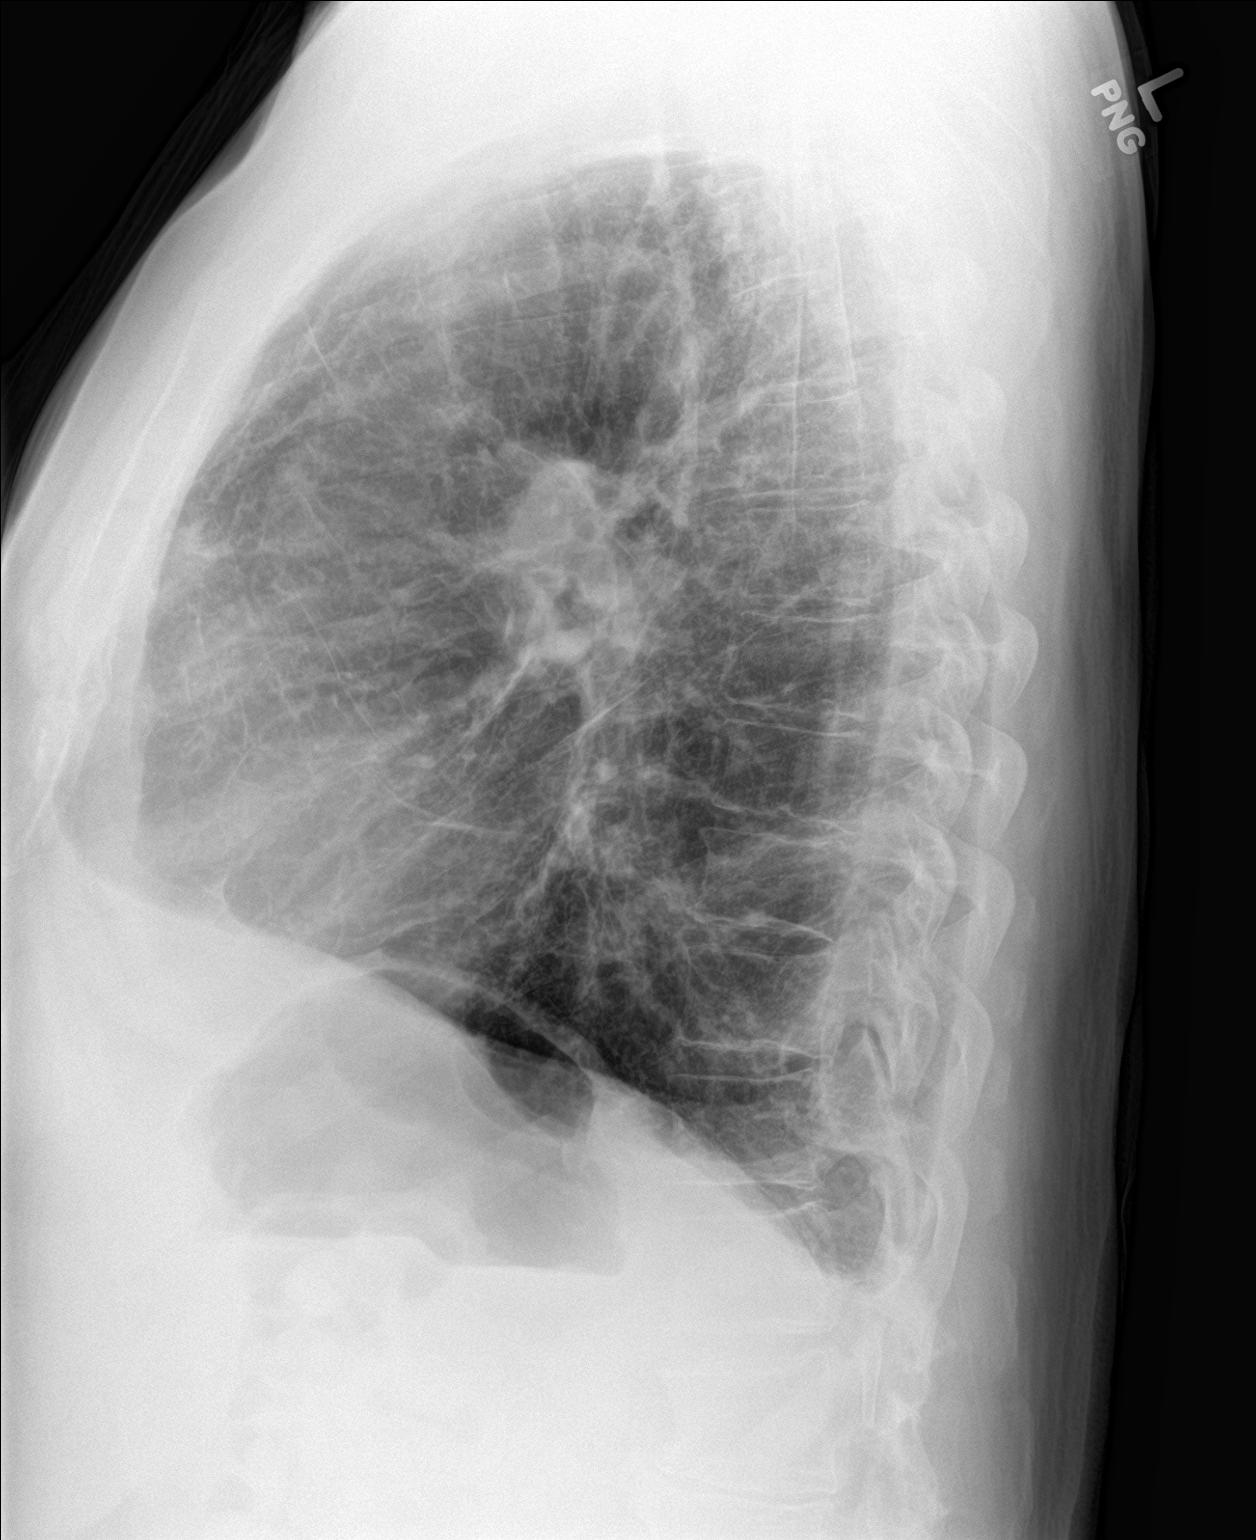

[3 of 3 positions shown; findings below may reference images not displayed]

FINDINGS: The lungs are well-expanded. There is chronic scarring in the right
pulmonary apex and at the right lung base. The interstitial markings
of both lungs are coarse greatest on the right. There is no alveolar
infiltrate. There may be a trace of pleural fluid blunting the
costophrenic angles but this is stable. The heart and pulmonary
vascularity are normal. The bony thorax exhibits no acute
abnormality.
IMPRESSION: COPD with chronic pulmonary fibrotic changes. No acute pneumonia nor
CHF.

## 2019-03-15 ENCOUNTER — Encounter (HOSPITAL_COMMUNITY): Payer: Self-pay | Admitting: Emergency Medicine

## 2019-03-15 ENCOUNTER — Emergency Department (HOSPITAL_COMMUNITY)
Admission: EM | Admit: 2019-03-15 | Discharge: 2019-03-15 | Disposition: A | Discharge status: Home / Self Care | Payer: Medicare Other | Attending: Emergency Medicine | Admitting: Emergency Medicine

## 2019-03-15 ENCOUNTER — Other Ambulatory Visit: Payer: Self-pay

## 2019-03-15 DIAGNOSIS — R55 Syncope and collapse: Secondary | ICD-10-CM

## 2019-03-15 DIAGNOSIS — J449 Chronic obstructive pulmonary disease, unspecified: Secondary | ICD-10-CM | POA: Diagnosis not present

## 2019-03-15 DIAGNOSIS — I5022 Chronic systolic (congestive) heart failure: Secondary | ICD-10-CM | POA: Diagnosis not present

## 2019-03-15 DIAGNOSIS — Z79899 Other long term (current) drug therapy: Secondary | ICD-10-CM | POA: Diagnosis not present

## 2019-03-15 DIAGNOSIS — Z794 Long term (current) use of insulin: Secondary | ICD-10-CM | POA: Insufficient documentation

## 2019-03-15 DIAGNOSIS — Z7901 Long term (current) use of anticoagulants: Secondary | ICD-10-CM | POA: Insufficient documentation

## 2019-03-15 DIAGNOSIS — E119 Type 2 diabetes mellitus without complications: Secondary | ICD-10-CM | POA: Insufficient documentation

## 2019-03-15 DIAGNOSIS — I110 Hypertensive heart disease with heart failure: Secondary | ICD-10-CM | POA: Insufficient documentation

## 2019-03-15 DIAGNOSIS — Z87891 Personal history of nicotine dependence: Secondary | ICD-10-CM | POA: Diagnosis not present

## 2019-03-15 LAB — BASIC METABOLIC PANEL
Anion gap: 11 (ref 5–15)
BUN: 25 mg/dL — ABNORMAL HIGH (ref 8–23)
CO2: 23 mmol/L (ref 22–32)
Calcium: 9.3 mg/dL (ref 8.9–10.3)
Chloride: 101 mmol/L (ref 98–111)
Creatinine, Ser: 1.36 mg/dL — ABNORMAL HIGH (ref 0.61–1.24)
GFR calc Af Amer: 58 mL/min — ABNORMAL LOW (ref >60)
GFR calc non Af Amer: 50 mL/min — ABNORMAL LOW (ref >60)
Glucose, Bld: 257 mg/dL — ABNORMAL HIGH (ref 70–99)
Potassium: 4.4 mmol/L (ref 3.5–5.1)
Sodium: 135 mmol/L (ref 135–145)

## 2019-03-15 LAB — CBC
HCT: 36.2 % — ABNORMAL LOW (ref 39.0–52.0)
Hemoglobin: 11.5 g/dL — ABNORMAL LOW (ref 13.0–17.0)
MCH: 28.1 pg (ref 26.0–34.0)
MCHC: 31.8 g/dL (ref 30.0–36.0)
MCV: 88.5 fL (ref 80.0–100.0)
Platelets: 38 10*3/uL — ABNORMAL LOW (ref 150–400)
RBC: 4.09 MIL/uL — ABNORMAL LOW (ref 4.22–5.81)
RDW: 14.2 % (ref 11.5–15.5)
WBC: 3.9 10*3/uL — ABNORMAL LOW (ref 4.0–10.5)
nRBC: 0 % (ref 0.0–0.2)

## 2019-03-15 LAB — CBG MONITORING, ED: Glucose-Capillary: 227 mg/dL — ABNORMAL HIGH (ref 70–99)

## 2019-03-15 LAB — TROPONIN I (HIGH SENSITIVITY)
Troponin I (High Sensitivity): 14 ng/L (ref <18)
Troponin I (High Sensitivity): 14 ng/L (ref <18)

## 2019-03-15 LAB — PROTIME-INR
INR: 1.2 (ref 0.8–1.2)
Prothrombin Time: 15 seconds (ref 11.4–15.2)

## 2019-03-15 NOTE — Discharge Instructions (Signed)
You were seen in the emergency department today after passing out.  Your initial blood work is normal but you need close follow-up with both your PCP and cardiologist.  I have listed the numbers on this discharge paperwork.  Should your symptoms worsen or return you should present immediately to the emergency department and/or call 911.

## 2019-03-15 NOTE — ED Provider Notes (Signed)
[facility] EMERGENCY DEPARTMENT Provider Note   CSN: [id number] Arrival date & time: 03/15/19  1258     History   Chief Complaint Chief Complaint  Patient presents with  . Loss of Consciousness    HPI [name] is a 78 y.o. male.     HPI Patient reports he had been outside doing some light work on his deck.  He is been using a blower to blow leaves off.  He reports that he walked through the door and just suddenly passed out.  He denies that he had any warning.  He reports that he did not have chest pain or headache.  He denies that he sustained any injury.  He reports he feels fine now.  He reports he is had other episodes where he is gotten lightheadedness particularly when being outside.  Reports usually if he can find a place to sit down pretty quickly it starts to pass.  He is felt well recently no fevers no chills.  No vomiting no diarrhea.  No cough, no exertional chest pain or shortness of breath. Past Medical History:  Diagnosis Date  . 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 Anxiety   . Aortic aneurysm (HCC) 09/02/2017  . BENIGN PROSTATIC HYPERTROPHY 11/23/2009  . Cardiomyopathy (HCC) 08/28/2016  . Chronic systolic CHF (congestive heart failure) (HCC) 09/02/2017  . COPD (chronic obstructive pulmonary disease) (HCC)   . CORONARY ARTERY DISEASE 11/23/2009  . 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 DEGENERATIVE JOINT DISEASE 11/23/2009  . DEPRESSION 11/23/2009  . FATIGUE 11/23/2009  . GAIT DISTURBANCE 12/10/2009  . HEMOPTYSIS UNSPECIFIED 05/07/2010  . High cholesterol   . HYPERTENSION 07/30/2009  . HYPOTHYROIDISM 07/30/2009  . Ischemic cardiomyopathy 09/02/2017  . 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 On home oxygen therapy    "2-3L; 24/7" (09/10/2016)  . OSA on CPAP   . PTSD (post-traumatic stress disorder) 03/10/2012  . 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 RA (rheumatoid arthritis) (HCC) 06/11/2011   "qwhere" (09/10/2016)  . RESPIRATORY FAILURE,  CHRONIC 07/31/2009  . Scleritis of both eyes 03/17/2014  . 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 Type II diabetes mellitus [facility])     Patient Active Problem List   Diagnosis Date Noted  . Thrombocytopenia (HCC) 01/22/2018  . Symptomatic anemia 01/20/2018  . Chronic respiratory failure with hypoxia (HCC) 12/01/2017  . Lung mass 12/01/2017  . PAF (paroxysmal atrial fibrillation) (HCC)   . Ischemic cardiomyopathy 09/02/2017  . Chronic systolic CHF (congestive heart failure) (HCC) 09/02/2017  . Aortic aneurysm (HCC) 09/02/2017  . Diabetes mellitus with complication, with long-term current use of insulin (HCC) 09/01/2017  . Anxiety and depression 09/01/2017  . COPD (chronic obstructive pulmonary disease) (HCC) 09/01/2017  . Hyperlipidemia 01/05/2014  . OSA on CPAP 06/10/2011  . Coronary atherosclerosis 11/23/2009  . Hypothyroidism 07/30/2009  . Essential hypertension 07/30/2009    Past Surgical History:  Procedure Laterality Date  . 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 ABDOMINAL EXPLORATION SURGERY  02/2004   w/LOA/notes 12/10/2010; small bowel obstruction repair with adhesiolysis   . BACK SURGERY    . CARDIAC CATHETERIZATION     2 heart caths in the past.  One in 2000s showed one ulcerated plaque  Rx medically; Second at [facility] [name] 09/05/2016  . CATARACT EXTRACTION W/ INTRAOCULAR LENS  IMPLANT, BILATERAL Bilateral 2000s  . COLECTOMY     hx of remote ileum resection due to bleeding  . COLONOSCOPY WITH PROPOFOL N/A  01/22/2018   Procedure: COLONOSCOPY WITH PROPOFOL;  Surgeon: Iva BoopGessner, Carl E, MD;  Location: [facility] ENDOSCOPY;  Service: Endoscopy;  Laterality: N/A;  . CORONARY ANGIOPLASTY WITH STENT PLACEMENT  09/10/2016  . CORONARY STENT INTERVENTION N/A 09/10/2016   Procedure: Coronary Stent Intervention;  Surgeon: Kathleene Hazelhristopher D McAlhany, MD;  [facility];  Service: Cardiovascular;  Laterality: N/A;  Distal RCA 4.0x16 Synergy  . ESOPHAGOGASTRODUODENOSCOPY (EGD) WITH  PROPOFOL N/A 01/22/2018   Procedure: ESOPHAGOGASTRODUODENOSCOPY (EGD) WITH PROPOFOL;  Surgeon: Iva BoopGessner, Carl E, MD;  Location: [facility] ENDOSCOPY;  Service: Endoscopy;  Laterality: N/A;  . FEMORAL EMBOLOECTOMY Left 07/2000   with left leg ischemia; Dr. Hart RochesterLawson, vascular  . GANGLION CYST EXCISION Right    "wrist"; Dr. Teressa SenterSypher  . HOT HEMOSTASIS N/A 01/22/2018   Procedure: HOT HEMOSTASIS (ARGON PLASMA COAGULATION/BICAP);  Surgeon: Iva BoopGessner, Carl E, MD;  [facility];  Service: Endoscopy;  Laterality: N/A;  . LUMBAR LAMINECTOMY  1972   Dr. Fannie KneeSue  . RIGHT/LEFT HEART CATH AND CORONARY ANGIOGRAPHY N/A 09/10/2016   Procedure: Right/Left Heart Cath and Coronary Angiography;  Surgeon: Kathleene Hazelhristopher D McAlhany, MD;  [facility];  Service: Cardiovascular;  Laterality: N/A;  . TONSILLECTOMY          Home Medications    Prior to Admission medications   Medication Sig Start Date End Date Taking? Authorizing Provider  albuterol (PROVENTIL) (2.5 MG/3ML) 0.083% nebulizer solution 1 vial in nebulizer every 6 hours as needed Dx 496 Patient taking differently: Take 2.5 mg by nebulization every 6 (six) hours as needed for wheezing or shortness of breath. 1 vial in nebulizer every 6 hours as needed Dx 496 05/31/12  Yes Ramaswamy, Carmin MuskratMurali, MD  ALPRAZolam Prudy Feeler(XANAX) 0.5 MG tablet Take 0.5 mg by mouth at bedtime.    Yes [provider]  apixaban (ELIQUIS) 5 MG TABS tablet Take 1 tablet (5 mg total) by mouth 2 (two) times daily. 01/18/19  Yes Lewayne Buntingrenshaw, Brian S, MD  atorvastatin (LIPITOR) 40 MG tablet Take 1 tablet (40 mg total) by mouth daily. 10/21/16  Yes Pricilla Riffleoss, Paula V, MD  Carboxymethylcellul-Glycerin (LUBRICATING EYE DROPS OP) Apply 1 drop to eye daily as needed (dry eyes).   Yes [provider]  carvedilol (COREG) 3.125 MG tablet Take 1 tablet (3.125 mg total) by mouth daily. 12/15/18  Yes Lewayne Buntingrenshaw, Brian S, MD  Cholecalciferol (VITAMIN D3) 2000 units capsule Take 2,000 Units by mouth  daily.  01/11/16  Yes [provider]  insulin glargine (LANTUS) 100 UNIT/ML injection Inject 0.4 mLs (40 Units total) into the skin at bedtime. Patient taking differently: Inject 50 Units into the skin at bedtime.  09/05/17  Yes Rodolph Bonghompson, Daniel V, MD  levothyroxine (SYNTHROID) 125 MCG tablet Take 125 mcg by mouth daily before breakfast.    Yes [provider]  losartan (COZAAR) 50 MG tablet Take 1 tablet (50 mg total) by mouth daily. 09/08/18  Yes Lewayne Buntingrenshaw, Brian S, MD  metFORMIN (GLUCOPHAGE) 500 MG tablet Take 500 mg by mouth 2 (two) times daily with a meal.   Yes [provider]  Multiple Vitamins-Minerals (MEGA MULTIVITAMIN FOR MEN PO) Take 1 tablet by mouth daily.    Yes [provider]  Olodaterol HCl (STRIVERDI RESPIMAT) 2.5 MCG/ACT AERS Inhale 1 puff into the lungs daily. 04/08/18  Yes Parrett, Tammy S, NP  OXYGEN Inhale 2 L into the lungs continuous.   Yes [provider]  Semaglutide (OZEMPIC) 1 MG/DOSE SOPN Inject 1 mg into the skin once  a week. 01/29/18  Yes Shelda Pal, DO  tamsulosin (FLOMAX) 0.4 MG CAPS capsule Take 1 capsule (0.4 mg total) by mouth at bedtime. 09/14/17  Yes Shelda Pal, DO  tiotropium (SPIRIVA HANDIHALER) 18 MCG inhalation capsule Place 1 capsule (18 mcg total) into inhaler and inhale daily. 07/16/17  Yes Parrett, Fonnie Mu, NP    Family History Family History  Problem Relation Age of Onset  . Other Mother        gun shot    Social History Social History   Tobacco Use  . Smoking status: Former Smoker    Packs/day: 2.50    Years: 40.00    Pack years: 100.00    Types: Cigarettes, Pipe, Cigars    Quit date: 07/28/1998    Years since quitting: 20.6  . Smokeless tobacco: Never Used  Substance Use Topics  . Alcohol use: No    Alcohol/week: 0.0 standard drinks  . Drug use: No     Allergies   No known allergies   Review of Systems Review of Systems 10 Systems reviewed and are negative for  acute change except as noted in the HPI.   Physical Exam Updated Vital Signs BP (!) 188/108   Pulse 65   Temp 97.8 F (36.6 C) (Oral)   Resp (!) 21   Ht 6\' 2"  (1.88 m)   Wt 94.3 kg   SpO2 100%   BMI 26.71 kg/m   Physical Exam Constitutional:      Appearance: He is well-developed.  HENT:     Head: Normocephalic and atraumatic.  Eyes:     Extraocular Movements: Extraocular movements intact.     Pupils: Pupils are equal, round, and reactive to light.     Comments: Hyperpigmentation of sclera.  Neck:     Musculoskeletal: Neck supple.  Cardiovascular:     Rate and Rhythm: Normal rate and regular rhythm.     Heart sounds: Normal heart sounds.  Pulmonary:     Effort: Pulmonary effort is normal.     Breath sounds: Normal breath sounds.  Abdominal:     General: Bowel sounds are normal. There is no distension.     Palpations: Abdomen is soft.     Tenderness: There is no abdominal tenderness.  Musculoskeletal: Normal range of motion.  Skin:    General: Skin is warm and dry.  Neurological:     General: No focal deficit present.     Mental Status: He is alert and oriented to person, place, and time.     GCS: GCS eye subscore is 4. GCS verbal subscore is 5. GCS motor subscore is 6.     Coordination: Coordination normal.  Psychiatric:        Mood and Affect: Mood normal.      ED Treatments / Results  Labs (all labs ordered are listed, but only abnormal results are displayed) Labs Reviewed  BASIC METABOLIC PANEL - Abnormal; Notable for the following components:      Result Value   Glucose, Bld 257 (*)    BUN 25 (*)    Creatinine, Ser 1.36 (*)    GFR calc non Af Amer 50 (*)    GFR calc Af Amer 58 (*)    All other components within normal limits  CBC - Abnormal; Notable for the following components:   WBC 3.9 (*)    RBC 4.09 (*)    Hemoglobin 11.5 (*)    HCT 36.2 (*)    Platelets 38 (*)  All other components within normal limits  CBG MONITORING, ED - Abnormal;  Notable for the following components:   Glucose-Capillary 227 (*)    All other components within normal limits  PROTIME-INR  TROPONIN I (HIGH SENSITIVITY)  TROPONIN I (HIGH SENSITIVITY)    EKG EKG Interpretation  Date/Time:  Tuesday March 15 2019 13:00:50 EDT Ventricular Rate:  77 PR Interval:    QRS Duration: 174 QT Interval:  451 QTC Calculation: 511 R Axis:   -57 Text Interpretation:  SR occasional PAC Left bundle branch block Baseline wander in lead(s) V1 no sig change fromold Confirmed by Arby Barrette [phone]) on 03/15/2019 1:50:54 PM   Radiology No results found.  Procedures Procedures (including critical care time)  Medications Ordered in ED Medications - No data to display   Initial Impression / Assessment and Plan / ED Course  I have reviewed the triage vital signs and the nursing notes.  Pertinent labs & imaging results that were available during my care of the patient were reviewed by me and considered in my medical decision making (see chart for details).       Patient has syncopal episode.  He reports he has had a number of prior episodes of getting lightheaded and needing to sit down before passing out.  He reports he is had evaluation for this and has worn a 30-day monitor.  Reports he sees his cardiologist.  First enzymes are negative.  Patient has old left bundle branch block.  No acute changes.  I have advised the patient that hospital admission with monitoring would be the safest management for a syncopal episode unprovoked as this was.  Patient however is adamant that he must go home.  He cares for his wife at home who is on hospice.  Will obtain a second enzyme to make sure no elevations.  Dr. Sharia Reeve long will review results and reassess patient for discharge.  Patient is advised that at any point time, if he feels that he can stay or has any symptoms he must return immediately.  Final Clinical Impressions(s) / ED Diagnoses   Final diagnoses:   Syncope and collapse    ED Discharge Orders    None       Arby Barrette, MD 03/15/19 [phone]

## 2019-03-15 NOTE — ED Provider Notes (Signed)
Blood pressure (!) 188/108, pulse 65, temperature 97.8 F (36.6 C), temperature source Oral, resp. rate (!) 21, height 6\' 2"  (1.88 m), weight 94.3 kg, SpO2 100 %.  Assuming care from Dr. Johnney Killian.  In short, [name] is a 78 y.o. male with a chief complaint of Loss of Consciousness .  Refer to the original H&P for additional details.  The current plan of care is to f/u on repeat troponin. Patient with syncope. Prior EDP encouraged admit for further monitoring and w/u but patient cares for his elderly wife at home and refuses admit.    EKG Interpretation  Date/Time:  Tuesday March 15 2019 13:00:50 EDT Ventricular Rate:  77 PR Interval:    QRS Duration: 174 QT Interval:  451 QTC Calculation: 511 R Axis:   -57 Text Interpretation:  SR occasional PAC Left bundle branch block Baseline wander in lead(s) V1 no sig change fromold Confirmed by Charlesetta Shanks [phone]) on 03/15/2019 1:50:54 PM      04:40 PM  Second troponin is unchanged.  Again discussed admission.  Patient is followed by cardiology as an outpatient.  He would prefer to call the office tomorrow to set up follow-up.  Discussed that if his symptoms worsen or return he should return to the emergency department immediately.  Patient verbalized understanding.    Margette Fast, MD 03/15/19 [phone]

## 2019-03-15 NOTE — ED Notes (Signed)
Pt reports he drank 2 cups of coffee this am and went outside to water flowers and feed the birds. Pt says upon entering the hours he had a syncopal episode. He reports this happens often but its usually d/t bilateral leg weakness and he remebers everything about the fall. He states today was different because he didn't realize it had happen until he woke up.  Pt denies N/V, dizziness or chest pain at this time

## 2019-03-15 NOTE — ED Triage Notes (Signed)
Pt arrives via EMS from home with reports of a syncopal episode after coming inside from feeding the birds witnessed by his wife. Pt has had syncopal episodes in the past. Pt denies any pain. Pt on chronic O2 2L

## 2019-03-16 ENCOUNTER — Telehealth: Payer: Self-pay

## 2019-03-16 ENCOUNTER — Telehealth: Payer: Self-pay | Admitting: Cardiology

## 2019-03-16 NOTE — Telephone Encounter (Signed)
Copied from [place] [id number]. Topic: General - Other >> Mar 16, 2019 11:49 AM Rainey Pines A wrote: Patient would like to let Dr. Sharlet Salina know that yesterday that he fell and blacked out and went to ER. Patient was advised to notify her about this and would like a callback from nurse.

## 2019-03-16 NOTE — Telephone Encounter (Signed)
FYI Called patient back and stated that he does not know what really happened but he just remembers waking up on the ground. States he usually gets signs of dizziness before he passes out but this time there was no warning. States initially did not have any injuries or pain but today is having knee pain in both knees has to use a walker right now with knee braces and taking ibuprofen for the pain.   Patient was told to follow up with cardiology which he has already scheduled. Just wanted to inform Dr. Sharlet Salina what happened

## 2019-03-16 NOTE — Telephone Encounter (Signed)
New Message   Patient is calling because he was instructed by the hospital to let Dr. Stanford Breed know that he passed out on yesterday.

## 2019-03-16 NOTE — Telephone Encounter (Signed)
Spoke with patient and he was seen in ED yesterday secondary to syncope. Patient stated he does have some dizziness at time, denies worse when going from sitting to standing.  Patient was advised to call cardiologist. Per patient orthostatic blood pressure done in ED and was told blood pressure dropped a little bit.  Scheduled visit with Dr Stanford Breed Monday 8/24.

## 2019-03-17 NOTE — Progress Notes (Signed)
HPI: FU CAD.Cardiac cath 2/18 showed 30 LM, 50 LAD, aneurysmal RCA with 80 mid to distal; had DES to RCA at that time.Last echo 2/19 showed EF 40-45, grade 2 DD, mild RAE. Carotid dopplers 2/19 showed no significant stenosis. Admitted with sepsis 2/19 and also with atrial fibrillation, converted spontaneously to sinus; TSH low and synthroid adjusted. Placed on amiodarone.Amiodarone discontinued at previous office visit. Monitor performed May 2019 showed no significant arrhythmia. 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 The left internal iliac was occluded and right and left common femoral artery aneurysms 1.8 and 1.7 cm. There were numerous mesenteric and retroperitoneal lymph nodes increased and lymphoproliferative disorder could not be excluded. Seen in the emergency room August 2020 with syncope.  BUN 25 and creatinine 1.36.  Troponins normal x2. ECG showed sinus rhythm with PACs, first-degree AV block, left bundle branch block.  Patient was advised for admission but he preferred follow-up in the office.  Since last seen,pt had a syncopal episode 1 week ago.  He states he was outside using his blower.  As he walked back inside he had a frank syncopal episode with no warning.  No associated chest pain, palpitations, nausea, dyspnea.  No incontinence or seizure activity.  Unknown duration of unconsciousness.  He has had some problems with dizzy spells over the past 3 weeks.  Otherwise denies dyspnea on exertion or chest pain.  Current Outpatient Medications  Medication Sig Dispense Refill  . albuterol (PROVENTIL) (2.5 MG/3ML) 0.083% nebulizer solution 1 vial in nebulizer every 6 hours as needed Dx 496 (Patient taking differently: Take 2.5 mg by nebulization every 6 (six) hours as needed for wheezing or shortness of breath. 1 vial in nebulizer every 6 hours as needed Dx 496)  120 mL 6  . ALPRAZolam (XANAX) 0.5 MG tablet Take 0.5 mg by mouth at bedtime.     [name] apixaban (ELIQUIS) 5 MG TABS tablet Take 1 tablet (5 mg total) by mouth 2 (two) times daily. 180 tablet 2  . atorvastatin (LIPITOR) 40 MG tablet Take 1 tablet (40 mg total) by mouth daily. 90 tablet 3  . Carboxymethylcellul-Glycerin (LUBRICATING EYE DROPS OP) Apply 1 drop to eye daily as needed (dry eyes).    . carvedilol (COREG) 3.125 MG tablet Take 1 tablet (3.125 mg total) by mouth daily. 180 tablet 2  . Cholecalciferol (VITAMIN D3) 2000 units capsule Take 2,000 Units by mouth daily.     . insulin glargine (LANTUS) 100 UNIT/ML injection Inject 0.4 mLs (40 Units total) into the skin at bedtime. (Patient taking differently: Inject 50 Units into the skin at bedtime. ) 10 mL 0  . levothyroxine (SYNTHROID) 125 MCG tablet Take 125 mcg by mouth daily before breakfast.     . losartan (COZAAR) 50 MG tablet Take 1 tablet (50 mg total) by mouth daily. 90 tablet 3  . metFORMIN (GLUCOPHAGE) 500 MG tablet Take 500 mg by mouth 2 (two) times daily with a meal.    . Multiple Vitamins-Minerals (MEGA MULTIVITAMIN FOR MEN PO) Take 1 tablet by mouth daily.     . Olodaterol HCl (STRIVERDI RESPIMAT) 2.5 MCG/ACT AERS Inhale 1 puff into the lungs daily.    . OXYGEN Inhale 2 L into the lungs continuous.    . Semaglutide (OZEMPIC) 1 MG/DOSE SOPN Inject 1 mg into the skin once a week.    [name]  tamsulosin (FLOMAX) 0.4 MG CAPS capsule Take 1 capsule (0.4 mg total) by mouth at bedtime. 30 capsule 0  . tiotropium (SPIRIVA HANDIHALER) 18 MCG inhalation capsule Place 1 capsule (18 mcg total) into inhaler and inhale daily. 30 capsule 4   No current facility-administered medications for this visit.      Past Medical History:  Diagnosis Date  . 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 Anxiety   . Aortic aneurysm (HCC) 09/02/2017  . BENIGN PROSTATIC HYPERTROPHY 11/23/2009  . Cardiomyopathy (HCC) 08/28/2016  . Chronic systolic CHF (congestive  heart failure) (HCC) 09/02/2017  . COPD (chronic obstructive pulmonary disease) (HCC)   . CORONARY ARTERY DISEASE 11/23/2009  . 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 DEGENERATIVE JOINT DISEASE 11/23/2009  . DEPRESSION 11/23/2009  . FATIGUE 11/23/2009  . GAIT DISTURBANCE 12/10/2009  . HEMOPTYSIS UNSPECIFIED 05/07/2010  . High cholesterol   . HYPERTENSION 07/30/2009  . HYPOTHYROIDISM 07/30/2009  . Ischemic cardiomyopathy 09/02/2017  . 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 On home oxygen therapy    "2-3L; 24/7" (09/10/2016)  . OSA on CPAP   . PTSD (post-traumatic stress disorder) 03/10/2012  . 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 RA (rheumatoid arthritis) (HCC) 06/11/2011   "qwhere" (09/10/2016)  . RESPIRATORY FAILURE, CHRONIC 07/31/2009  . Scleritis of both eyes 03/17/2014  . 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 Type II diabetes mellitus (HCC)     Past Surgical History:  Procedure Laterality Date  . 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 ABDOMINAL EXPLORATION SURGERY  02/2004   w/LOA/notes 12/10/2010; small bowel obstruction repair with adhesiolysis   . BACK SURGERY    . CARDIAC CATHETERIZATION     2 heart caths in the past.  One in 2000s showed one ulcerated plaque  Rx medically; [facility] [name] 09/05/2016  . CATARACT EXTRACTION W/ INTRAOCULAR LENS  IMPLANT, BILATERAL Bilateral 2000s  . COLECTOMY     hx of remote ileum resection due to bleeding  . COLONOSCOPY WITH PROPOFOL N/A 01/22/2018   Procedure: COLONOSCOPY WITH PROPOFOL;  Surgeon: Iva Boop, MD;  Location: [facility] ENDOSCOPY;  Service: Endoscopy;  Laterality: N/A;  . CORONARY ANGIOPLASTY WITH STENT PLACEMENT  09/10/2016  . CORONARY STENT INTERVENTION N/A 09/10/2016   Procedure: Coronary Stent Intervention;  Surgeon: Kathleene Hazel, MD;  Location: [facility] INVASIVE CV LAB;  Service: Cardiovascular;  Laterality: N/A;  Distal RCA 4.0x16 Synergy  . ESOPHAGOGASTRODUODENOSCOPY (EGD) WITH PROPOFOL N/A 01/22/2018   Procedure:  ESOPHAGOGASTRODUODENOSCOPY (EGD) WITH PROPOFOL;  Surgeon: Iva Boop, MD;  Location: [facility] ENDOSCOPY;  Service: Endoscopy;  Laterality: N/A;  . FEMORAL EMBOLOECTOMY Left 07/2000   with left leg ischemia; Dr. Hart Rochester, vascular  . GANGLION CYST EXCISION Right    "wrist"; Dr. Teressa Senter  . HOT HEMOSTASIS N/A 01/22/2018   Procedure: HOT HEMOSTASIS (ARGON PLASMA COAGULATION/BICAP);  Surgeon: Iva Boop, MD;  Location: [facility];  Service: Endoscopy;  Laterality: N/A;  . LUMBAR LAMINECTOMY  1972   Dr. Fannie Knee  . RIGHT/LEFT HEART CATH AND CORONARY ANGIOGRAPHY N/A 09/10/2016   Procedure: Right/Left Heart Cath and Coronary Angiography;  Surgeon: Kathleene Hazel, MD;  Location: [facility] INVASIVE CV LAB;  Service: Cardiovascular;  Laterality: N/A;  . TONSILLECTOMY      Social History   Socioeconomic History  . Marital status: Married    Spouse name: Not on file  . Number of children: Not on file  . Years of education: Not on file  . Highest education level:  Not on file  Occupational History  . Occupation: disabled Cytogeneticist, Ex Neurosurgeon: RETIRED  Social Needs  . Financial resource strain: Not on file  . Food insecurity    Worry: Not on file    Inability: Not on file  . Transportation needs    Medical: Not on file    Non-medical: Not on file  Tobacco Use  . Smoking status: Former Smoker    Packs/day: 2.50    Years: 40.00    Pack years: 100.00    Types: Cigarettes, Pipe, Cigars    Quit date: 07/28/1998    Years since quitting: 20.6  . Smokeless tobacco: Never Used  Substance and Sexual Activity  . Alcohol use: No    Alcohol/week: 0.0 standard drinks  . Drug use: No  . Sexual activity: Not on file  Lifestyle  . Physical activity    Days per week: Not on file    Minutes per session: Not on file  . Stress: Not on file  Relationships  . Social Musician on phone: Not on file    Gets together: Not on file    Attends religious service: Not on file     Active member of club or organization: Not on file    Attends meetings of clubs or organizations: Not on file    Relationship status: Not on file  . Intimate partner violence    Fear of current or ex partner: Not on file    Emotionally abused: Not on file    Physically abused: Not on file    Forced sexual activity: Not on file  Other Topics Concern  . Not on file  Social History Narrative   Lives in the home with his wife   Sees [place] every 6 month for meds.   Denies asbestos exposure    Family History  Problem Relation Age of Onset  . Other Mother        gun shot    ROS: no fevers or chills, productive cough, hemoptysis, dysphasia, odynophagia, melena, hematochezia, dysuria, hematuria, rash, seizure activity, orthopnea, PND, pedal edema, claudication. Remaining systems are negative.  Physical Exam: Well-developed well-nourished in no acute distress.  Skin is warm and dry.  HEENT is normal.  Neck is supple.  Chest is clear to auscultation with normal expansion.  Cardiovascular exam is regular rate and rhythm.  Abdominal exam nontender or distended. No masses palpated. Extremities show no edema. neuro grossly intact  ECG-sinus rhythm with first-degree AV block, PAC, left bundle branch block.  Personally reviewed  A/P  1 recent syncopal episode-etiology unclear.  Electrocardiogram shows sinus rhythm, 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  We will arrange an echocardiogram to reassess LV function.  We will also arrange for patient to be seen by electrophysiology for consideration of a loop monitor (given baseline first-degree AV block and left bundle branch block I would be concerned about the potential for bradycardia arrhythmia or posttermination pauses as he also has a history of atrial fibrillation).  Patient instructed not to drive for 6 months.  2 Coronary artery disease-patient denies chest pain.  Continue medical therapy with statin.  He is not on aspirin  given need for apixaban.  3 paroxysmal atrial fibrillation-he remains in sinus rhythm on most recent ECG.  Amiodarone was previously discontinued because of lung disease.  Also note previous episode of atrial fibrillation occurred in the setting of urosepsis.  Continue apixaban.  4 history  of cardiomyopathy-continue ARB.  Given first-degree AV block and syncope I have elected to hold carvedilol for now.  5 chronic combined systolic/diastolic congestive heart failure-euvolemic today on examination.  Continue fluid restriction and low-sodium diet.  6 hypertension-blood pressure is controlled.  Continue present medications and follow.  7 hyperlipidemia-continue statin.  8 history of thoracic, abdominal and iliac aneurysms-schedule follow-up CTA.  Kirk Ruths, MD

## 2019-03-21 ENCOUNTER — Ambulatory Visit (INDEPENDENT_AMBULATORY_CARE_PROVIDER_SITE_OTHER): Payer: Medicare Other | Admitting: Cardiology

## 2019-03-21 ENCOUNTER — Other Ambulatory Visit: Payer: Self-pay

## 2019-03-21 ENCOUNTER — Encounter: Payer: Self-pay | Admitting: Cardiology

## 2019-03-21 VITALS — BP 130/66 | HR 73 | Temp 97.2°F | Ht 74.5 in | Wt 215.0 lb

## 2019-03-21 DIAGNOSIS — I712 Thoracic aortic aneurysm, without rupture, unspecified: Secondary | ICD-10-CM

## 2019-03-21 DIAGNOSIS — I714 Abdominal aortic aneurysm, without rupture, unspecified: Secondary | ICD-10-CM

## 2019-03-21 DIAGNOSIS — R55 Syncope and collapse: Secondary | ICD-10-CM | POA: Diagnosis not present

## 2019-03-21 DIAGNOSIS — I723 Aneurysm of iliac artery: Secondary | ICD-10-CM

## 2019-03-21 NOTE — Patient Instructions (Addendum)
Medication Instructions:  STOP CARVEDILOL  If you need a refill on your cardiac medications before your next appointment, please call your pharmacy.   Lab work: If you have labs (blood work) drawn today and your tests are completely normal, you will receive your results only by: [name] MyChart Message (if you have MyChart) OR . A paper copy in the mail If you have any lab test that is abnormal or we need to change your treatment, we will call you to review the results.  Testing/Procedures: Your physician has requested that you have an echocardiogram. Echocardiography is a painless test that uses sound waves to create images of your heart. It provides your doctor with information about the size and shape of your heart and how well your heart's chambers and valves are working. This procedure takes approximately one hour. There are no restrictions for this procedure.[place] CHEST/ABDOMEN/PELVIS TO FOLLOW UP ANEURYSMS AT THE [address] OFFICE    Follow-Up: At [facility], you and your health needs are our priority.  As part of our continuing mission to provide you with exceptional heart care, we have created designated Provider Care Teams.  These Care Teams include your primary Cardiologist (physician) and Advanced Practice Providers (APPs -  Physician Assistants and Nurse Practitioners) who all work together to provide you with the care you need, when you need it. Your physician recommends that you schedule a follow-up appointment in: [address] [place]

## 2019-03-23 ENCOUNTER — Ambulatory Visit (HOSPITAL_BASED_OUTPATIENT_CLINIC_OR_DEPARTMENT_OTHER)
Admission: RE | Admit: 2019-03-23 | Discharge: 2019-03-23 | Disposition: A | Discharge status: Home / Self Care | Payer: Medicare Other | Source: Ambulatory Visit | Attending: Cardiology | Admitting: Cardiology

## 2019-03-23 ENCOUNTER — Other Ambulatory Visit: Payer: Self-pay

## 2019-03-23 ENCOUNTER — Telehealth: Payer: Self-pay | Admitting: Cardiology

## 2019-03-23 ENCOUNTER — Encounter (HOSPITAL_BASED_OUTPATIENT_CLINIC_OR_DEPARTMENT_OTHER): Payer: Self-pay

## 2019-03-23 DIAGNOSIS — I714 Abdominal aortic aneurysm, without rupture, unspecified: Secondary | ICD-10-CM

## 2019-03-23 DIAGNOSIS — I712 Thoracic aortic aneurysm, without rupture, unspecified: Secondary | ICD-10-CM

## 2019-03-23 DIAGNOSIS — I723 Aneurysm of iliac artery: Secondary | ICD-10-CM | POA: Insufficient documentation

## 2019-03-23 DIAGNOSIS — R55 Syncope and collapse: Secondary | ICD-10-CM | POA: Insufficient documentation

## 2019-03-23 MED ORDER — IOHEXOL 350 MG/ML SOLN
100.0000 mL | Freq: Once | INTRAVENOUS | Status: AC | PRN
  Administered 2019-03-23: 15:00:00 100 mL via INTRAVENOUS

## 2019-03-23 NOTE — Telephone Encounter (Signed)
New Message   [name] with [facility] is calling to give result information for CT. Please call.

## 2019-03-23 NOTE — Progress Notes (Signed)
  Echocardiogram 2D Echocardiogram has been performed.  [name] 03/23/2019, 2:24 PM

## 2019-03-23 NOTE — Telephone Encounter (Signed)
See result note from dr Stanford Breed, Spoke with pt, he sees dr Elsworth Soho at [facility] pulmonary, office closed when I called to get appt.

## 2019-03-23 NOTE — Telephone Encounter (Signed)
Returned the call to [facility] and spoke with Butch Penny. They were calling to bring Dr. Jacalyn Lefevre attention to Impression  #2 on the pt CTA of the chest and aorta. Greig Castilla that the message will be fwd to Dr. Stanford Breed.

## 2019-03-24 NOTE — Telephone Encounter (Signed)
Spoke with pt, he will see tammy parrott at [place] [facility] tomorrow @ 11 am.

## 2019-03-25 ENCOUNTER — Ambulatory Visit: Payer: Medicare Other | Admitting: Adult Health

## 2019-03-25 ENCOUNTER — Other Ambulatory Visit: Payer: Self-pay

## 2019-03-25 ENCOUNTER — Encounter: Payer: Self-pay | Admitting: Adult Health

## 2019-03-25 VITALS — BP 132/74 | HR 54 | Temp 98.0°F | Ht 74.0 in | Wt 212.0 lb

## 2019-03-25 DIAGNOSIS — R911 Solitary pulmonary nodule: Secondary | ICD-10-CM | POA: Diagnosis not present

## 2019-03-25 DIAGNOSIS — J9611 Chronic respiratory failure with hypoxia: Secondary | ICD-10-CM

## 2019-03-25 DIAGNOSIS — Z9989 Dependence on other enabling machines and devices: Secondary | ICD-10-CM

## 2019-03-25 DIAGNOSIS — R918 Other nonspecific abnormal finding of lung field: Secondary | ICD-10-CM | POA: Insufficient documentation

## 2019-03-25 DIAGNOSIS — G4733 Obstructive sleep apnea (adult) (pediatric): Secondary | ICD-10-CM

## 2019-03-25 DIAGNOSIS — J449 Chronic obstructive pulmonary disease, unspecified: Secondary | ICD-10-CM

## 2019-03-25 LAB — CBC WITH DIFFERENTIAL/PLATELET
Basophils Absolute: 0 10*3/uL (ref 0.0–0.1)
Basophils Relative: 0.6 % (ref 0.0–3.0)
Eosinophils Absolute: 0.1 10*3/uL (ref 0.0–0.7)
Eosinophils Relative: 1.9 % (ref 0.0–5.0)
HCT: 32.6 % — ABNORMAL LOW (ref 39.0–52.0)
Hemoglobin: 10.7 g/dL — ABNORMAL LOW (ref 13.0–17.0)
Lymphocytes Relative: 23.9 % (ref 12.0–46.0)
Lymphs Abs: 1 10*3/uL (ref 0.7–4.0)
MCHC: 32.9 g/dL (ref 30.0–36.0)
MCV: 85.2 fl (ref 78.0–100.0)
Monocytes Absolute: 0.5 10*3/uL (ref 0.1–1.0)
Monocytes Relative: 13.2 % — ABNORMAL HIGH (ref 3.0–12.0)
Neutro Abs: 2.4 10*3/uL (ref 1.4–7.7)
Neutrophils Relative %: 60.4 % (ref 43.0–77.0)
Platelets: 37 10*3/uL — CL (ref 150.0–400.0)
RBC: 3.82 Mil/uL — ABNORMAL LOW (ref 4.22–5.81)
RDW: 15.4 % (ref 11.5–15.5)
WBC: 4 10*3/uL (ref 4.0–10.5)

## 2019-03-25 LAB — SEDIMENTATION RATE: Sed Rate: 4 mm/hr (ref 0–20)

## 2019-03-25 MED ORDER — AMOXICILLIN-POT CLAVULANATE 875-125 MG PO TABS: 1.0000 | ORAL_TABLET | Freq: Two times a day (BID) | ORAL | 0 refills | Status: AC

## 2019-03-25 NOTE — Assessment & Plan Note (Addendum)
New lung nodules noted on CT chest largest in the left apex maximum measuring at 1.4 cm Possible underlying infectious component. We will treat with course of antibiotics And follow-up CT chest in 3 months. Case discussed with Dr. Elsworth Soho.  Plan  Patient Instructions  Augmentin 875mg  Twice daily  For 1 week, take with food.  Labs today .  Continue on Spiriva and Striverdi daily , rinse after use .  Continue on Oxygen 2l/m Continue on CPAP At bedtime  With oxygen .  Order for new CPAP machine .  Wear each night for at least 6 hr each night .  Plan for CT chest in 3 months  Follow up in 4 weeks with [place] NP or Dr. Elsworth Soho with chest xray  Please contact office for sooner follow up if symptoms do not improve or worsen or seek emergency care

## 2019-03-25 NOTE — Assessment & Plan Note (Addendum)
COPD appears to be stable.  Will continue on current regimen  Plan  Patient Instructions  Augmentin 875mg  Twice daily  For 1 week, take with food.  Labs today .  Continue on Spiriva and Striverdi daily , rinse after use .  Continue on Oxygen 2l/m Continue on CPAP At bedtime  With oxygen .  Order for new CPAP machine .  Wear each night for at least 6 hr each night .  Plan for CT chest in 3 months  Follow up in 4 weeks with Dakota Imler NP or Dr. Elsworth Soho with chest xray  Please contact office for sooner follow up if symptoms do not improve or worsen or seek emergency care

## 2019-03-25 NOTE — Assessment & Plan Note (Signed)
Compensated on present regimen Continue on oxygen O2 saturation goal greater than 88 to 90%

## 2019-03-25 NOTE — Assessment & Plan Note (Addendum)
Controlled on CPAP with good compliance  Order for new CPAP machine Plan  Patient Instructions  Augmentin 875mg  Twice daily  For 1 week, take with food.  Labs today .  Continue on Spiriva and Striverdi daily , rinse after use .  Continue on Oxygen 2l/m Continue on CPAP At bedtime  With oxygen .  Order for new CPAP machine .  Wear each night for at least 6 hr each night .  Plan for CT chest in 3 months  Follow up in 4 weeks with Par[place] NP or Dr. Elsworth Soho with chest xray  Please contact office for sooner follow up if symptoms do not improve or worsen or seek emergency care

## 2019-03-25 NOTE — Progress Notes (Signed)
  ID: [name], male    DOB: 1940-09-07, 78 y.o.   MRN: [id number]  Chief Complaint  Patient presents with   Follow-up    COPD     Referring provider: Myrlene Broker, *  HPI:  78year-old male former smoker with gold stage III COPD, chronic respiratory failure on oxygen, OSA and Lung nodules Medical hx significant forrheumatoid arthritis(no targeted rx )and diabetes(on weekly injection and ,lantus, ) CHF dx 08/2016 (echo 35-40%),CAD 08/2016 s/p stent RCA, CKD A fib (previously on Amiodarone -stopped 03/10/18 )on Eliquis  Anemia - f/by Hematology (not bone marrow bx candidate )  Gets some of meds thru [place]  TEST [name]  March 2017 some 23 days ago 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  PFT 10/2015 FEV1 was 36%, ratio 46, FVC 58%, DLCO 26% Echo 08/2016 >EF 35-40%, s/p stent , Echo 08/2017 40-45%, gr 2 DD CXR 11/2016 LUL PNA ,  CT chest 12/2016 -masslike consolidation in LUL  CT chest 03/12/17 >stable masslike consolidation LUL 3.9cm , LUL 7 mm nodule  PET scan 03/20/17 >+metabolic act in LUL consolidation CT chest February 2019>nodular left-sided airspace opacities have improved from prior study. CT angio chest January 25, 2018>advanced emphysema, scarring of the left upper lobe, nodules of the left lower lobe are unchanged. Scattered scarring in the right lung is unchanged. CT chest September 2019>several pulmonary nodules scattered throughout the left lung stable since February 2019 decreased from 05/2017 most compatible with stable postinflammatory nodularity with recommendations for follow-up CT chest in 12 months.,Thick parenchymal band in the anterior left upper lobe slightly decreased in thickness, stable right fibrothorax with extensive basilar pleural-parenchymal scarring in the right lung, severe emphysema  08/2017 admitted with Sepsis [name]  12/2017 Admitted with GIB , anemia 02/2018 Hematology  following chronic anemia  03/25/2019 Follow up : COPD , O2 RF , Lung nodules, OSA  Patient presents for a 91-month follow-up.  Patient has underlying severe COPD that is oxygen dependent.  He remains on Spiriva and Striverdi .  Says overall breathing has been doing the same.  He gets winded with heavy activity.  Has a daily cough has seen a little bit of increased congestion but congestion seems to be clear for the most part.  He is followed at the [place] and gets his medications through their system. He remains on oxygen. Says his O2 saturations have been doing well.  Patient recently had a syncopal episode and required an emergency room visit.  He is currently undergoing a cardiology work-up.  Has plans for a Holter monitor later this month 2D echo showed improved EF at 60 to 65%.  He was recommended to discontinue his Coreg.  As he has had some ongoing dizziness.  Patient says that he is feeling better and does not feel sick.  He has no further syncopal episodes.  Denies any chest pain. Patient has a known thoracic and iliac artery aneurysms.  Follow-up CT angiography of chest abdomen and pelvis showed incidental finding of 1.4 x 0.9 cm left lung apex.  And multiple new nodular opacities in the left lower lobe.  Patient has known lung nodules that we have been following serially that have been stable to reduced in size.  The above nodules are new since previous CT scans.  Patient is on nocturnal CPAP for sleep apnea.  Says he is doing well.  He says he never misses a night.  Says he feels rested with no significant  daytime sleepiness.  Patient says his machine is getting old.  Is almost 78 years old and needs a new CPAP machine.   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   ablated   Anxiety    Aortic aneurysm (HCC) 09/02/2017   BENIGN PROSTATIC HYPERTROPHY 11/23/2009   Cardiomyopathy (HCC) 08/28/2016   Chronic systolic CHF (congestive heart failure) (HCC) 09/02/2017   COPD (chronic obstructive pulmonary disease) (HCC)    CORONARY ARTERY DISEASE 11/23/2009   DECREASED HEARING, 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   DEGENERATIVE JOINT DISEASE 11/23/2009   DEPRESSION 11/23/2009   FATIGUE 11/23/2009   GAIT DISTURBANCE 12/10/2009   HEMOPTYSIS UNSPECIFIED 05/07/2010   High cholesterol    HYPERTENSION 07/30/2009   HYPOTHYROIDISM 07/30/2009   Ischemic cardiomyopathy 09/02/2017   LUMBAR RADICULOPATHY, 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   On home oxygen therapy    "2-3L; 24/7" (09/10/2016)   OSA on CPAP    PTSD (post-traumatic stress disorder) 03/10/2012   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   RA (rheumatoid arthritis) (HCC) 06/11/2011   "qwhere" (09/10/2016)   RESPIRATORY FAILURE, CHRONIC 07/31/2009   Scleritis of both eyes 03/17/2014   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   Type II diabetes mellitus (HCC)     Tobacco History: Social History   Tobacco Use  Smoking Status Former Smoker   Packs/day: 2.50   Years: 40.00   Pack years: 100.00   Types: Cigarettes, Pipe, Cigars   Quit date: 07/28/1998   Years since quitting: 20.6  Smokeless Tobacco Never Used   Counseling given: Not Answered   Outpatient Medications Prior to Visit  Medication Sig Dispense Refill   albuterol (PROVENTIL) (2.5 MG/3ML) 0.083% nebulizer solution 1 vial in nebulizer every 6 hours as needed Dx 496 (Patient taking differently: Take 2.5 mg by nebulization  every 6 (six) hours as needed for wheezing or shortness of breath. 1 vial in nebulizer every 6 hours as needed Dx 496) 120 mL 6   ALPRAZolam (XANAX) 0.5 MG tablet Take 0.5 mg by mouth at bedtime.      apixaban (ELIQUIS) 5 MG TABS tablet Take 1 tablet (5 mg total) by mouth 2 (two) times daily. 180 tablet 2   atorvastatin (LIPITOR) 40 MG tablet Take 1 tablet (40 mg total) by mouth daily. 90 tablet 3   Carboxymethylcellul-Glycerin (LUBRICATING EYE DROPS OP) Apply 1 drop to eye daily as needed (dry eyes).     Cholecalciferol (VITAMIN D3) 2000 units capsule Take 2,000 Units by mouth daily.      insulin glargine (LANTUS) 100 UNIT/ML injection Inject 0.4 mLs (40 Units total) into the skin at bedtime. (Patient taking differently: Inject 50 Units into the skin at bedtime. ) 10 mL 0   levothyroxine (SYNTHROID) 125 MCG tablet Take 125 mcg by mouth daily before breakfast.      losartan (COZAAR) 50 MG tablet Take 1 tablet (50 mg total) by mouth daily. 90 tablet 3   metFORMIN (GLUCOPHAGE) 500 MG tablet Take 500 mg by mouth 2 (two) times daily with a meal.  Multiple Vitamins-Minerals (MEGA MULTIVITAMIN FOR MEN PO) Take 1 tablet by mouth daily.      Olodaterol HCl (STRIVERDI RESPIMAT) 2.5 MCG/ACT AERS Inhale 1 puff into the lungs daily.     OXYGEN Inhale 2 L into the lungs continuous.     Semaglutide (OZEMPIC) 1 MG/DOSE SOPN Inject 1 mg into the skin once a week.     tamsulosin (FLOMAX) 0.4 MG CAPS capsule Take 1 capsule (0.4 mg total) by mouth at bedtime. 30 capsule 0   tiotropium (SPIRIVA HANDIHALER) 18 MCG inhalation capsule Place 1 capsule (18 mcg total) into inhaler and inhale daily. 30 capsule 4   No facility-administered medications prior to visit.      Review of Systems:   Constitutional:   No  weight loss, night sweats,  Fevers, chills, + fatigue, or  lassitude.  HEENT:   No headaches,  Difficulty swallowing,  Tooth/dental problems, or  Sore throat,                No sneezing,  itching, ear ache, nasal congestion, post nasal drip,   CV:  No chest pain,  Orthopnea, PND,+ swelling in lower extremities,  No anasarca, dizziness, palpitations, syncope.   GI  No heartburn, indigestion, abdominal pain, nausea, vomiting, diarrhea, change in bowel habits, loss of appetite, bloody stools.   Resp:    No chest wall deformity  Skin: no rash or lesions.  GU: no dysuria, change in color of urine, no urgency or frequency.  No flank pain, no hematuria   MS:  No joint pain or swelling.  No decreased range of motion.  No back pain.    Physical Exam  BP 132/74 (BP Location: Left Arm, Cuff Size: Normal)    Pulse (!) 54    Temp 98 F (36.7 C) (Oral)    Ht 6\' 2"  (1.88 m)    Wt 212 lb (96.2 kg)    SpO2 97%    BMI 27.22 kg/m   GEN: A/Ox3; pleasant , NAD, elderly, on oxygen   HEENT:  /AT,   NOSE-clear, THROAT-clear, no lesions, no postnasal drip or exudate noted.   NECK:  Supple w/ fair ROM; no JVD; normal carotid impulses w/o bruits; no thyromegaly or nodules palpated; no lymphadenopathy.    RESP diminished breath sounds in the bases . no accessory muscle use, no dullness to percussion  CARD:  RRR, no m/r/g, no peripheral edema, pulses intact, no cyanosis or clubbing.  GI:   Soft & nt; nml bowel sounds; no organomegaly or masses detected.   Musco: Warm bil, no deformities or joint swelling noted.   Neuro: alert, no focal deficits noted.    Skin: Warm, no lesions or rashes    Lab Results:  CBC    Component Value Date/Time   WBC 4.0 03/25/2019 1138   RBC 3.82 (L) 03/25/2019 1138   HGB 10.7 (L) 03/25/2019 1138   HGB 11.3 (L) 01/03/2019 0934   HGB 11.0 (L) 12/23/2016 1053   HCT 32.6 (L) 03/25/2019 1138   HCT 29.6 (L) 01/23/2018 0830   PLT 37.0 Repeated and verified X2. (LL) 03/25/2019 1138   PLT 49 (L) 01/03/2019 0934   PLT 125 (L) 12/23/2016 1053   MCV 85.2 03/25/2019 1138   MCV 84 12/23/2016 1053   MCH 28.1 03/15/2019 1308   MCHC 32.9 03/25/2019 1138    RDW 15.4 03/25/2019 1138   RDW 15.6 (H) 12/23/2016 1053   LYMPHSABS 1.0 03/25/2019 1138   LYMPHSABS 1.2 12/23/2016 1053  MONOABS 0.5 03/25/2019 1138   EOSABS 0.1 03/25/2019 1138   EOSABS 0.3 12/23/2016 1053   BASOSABS 0.0 03/25/2019 1138   BASOSABS 0.0 12/23/2016 1053    BMET    Component Value Date/Time   NA 135 03/15/2019 1308   NA 142 09/23/2018 0955   K 4.4 03/15/2019 1308   CL 101 03/15/2019 1308   CO2 23 03/15/2019 1308   GLUCOSE 257 (H) 03/15/2019 1308   BUN 25 (H) 03/15/2019 1308   BUN 27 09/23/2018 0955   CREATININE 1.36 (H) 03/15/2019 1308   CREATININE 1.12 07/15/2018 1059   CALCIUM 9.3 03/15/2019 1308   GFRNONAA 50 (L) 03/15/2019 1308   GFRNONAA >60 07/15/2018 1059   GFRAA 58 (L) 03/15/2019 1308   GFRAA >60 07/15/2018 1059    BNP No results found for: BNP  ProBNP    Component Value Date/Time   PROBNP 188 10/20/2016 1038   PROBNP 59.9 05/04/2011 0517    Imaging: Ct Angio Chest Aorta W &/or Wo Contrast  Result Date: 03/23/2019 CLINICAL DATA:  78 year old male with a history of thoracic and right iliac artery aneurysms. EXAM: CT ANGIOGRAPHY CHEST, ABDOMEN AND PELVIS TECHNIQUE: Multidetector CT imaging through the chest, abdomen and pelvis was performed using the standard protocol during bolus administration of intravenous contrast. Multiplanar reconstructed images and MIPs were obtained and reviewed to evaluate the vascular anatomy. CONTRAST:  182mL OMNIPAQUE IOHEXOL 350 MG/ML SOLN COMPARISON:  Prior CTA chest abdomen and pelvis 01/22/2017 FINDINGS: CTA CHEST FINDINGS Cardiovascular: Conventional 3 vessel arch anatomy. The aortic root is normal in caliber. There is some thickening and calcification of the aortic valve. The ascending thoracic aorta is normal in caliber. There is focal fusiform aneurysmal dilation of the distal arch at the origin of the left subclavian artery which measures up to 4.0 cm, slightly increased compared to 3.9 cm previously. Normal  caliber main pulmonary artery. Calcifications are present along the left anterior descending and right coronary arteries. The heart is normal in size. No pericardial effusion. Mediastinum/Nodes: Unremarkable CT appearance of the thyroid gland. No suspicious mediastinal or hilar adenopathy. No soft tissue mediastinal mass. The thoracic esophagus is unremarkable. Lungs/Pleura: Extensive calcified pleural plaques in the right hemithorax beginning at the lung apex and extending along the anterior and posterior pleural surface to the lung base posteriorly. Moderately severe combined paraseptal and centrilobular pulmonary emphysema. Pleuroparenchymal scarring at the left lung apex. There is a new nodular opacity in the left lung apex with subtle spiculation of the margin and central low attenuation concerning for central necrosis. The nodule measures 1.4 x 0.9 cm and was not present on the prior CT scan. No pleural effusion or pneumothorax. Multiple (at least 6) new nodular opacities are now visualized in the left lower lobe which were not identified on the prior study. Several of the nodules are somewhat fusiform in appearance and appear to be branching. The largest individual nodule measures up to 2.3 cm. Musculoskeletal: No acute fracture or aggressive appearing lytic or blastic osseous lesion. Review of the MIP images confirms the above findings. CTA ABDOMEN AND PELVIS FINDINGS VASCULAR Aorta: Multifocal penetrating aortic ulcers as previously noted. The maximal diameter of the aorta at a site of penetrating aortic ulceration just proximal to the origin the SMA measures 3.3 cm, slightly enlarged compared to 3.1 cm previously. Surgical changes of prior aorto bi-iliac bypass graft again noted. The anastomoses are patent without evidence of complication. Celiac: Patent without evidence of aneurysm, dissection, vasculitis or significant stenosis. SMA:  Patent without evidence of aneurysm, dissection, vasculitis or  significant stenosis. Renals: Solitary renal arteries bilaterally. 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 The right renal artery remains normal in appearance. IMA: Patent without evidence of aneurysm, dissection, vasculitis or significant stenosis. Inflow: Slight interval progression of aneurysmal dilation of the right internal iliac artery now measuring up to 2.3 cm compared to 2.1 cm previously. 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 Ectatic bilateral common femoral arteries remain stable. Veins: No focal venous abnormality. Review of the MIP images confirms the above findings. NON-VASCULAR Hepatobiliary: Normal hepatic contour and morphology. No discrete hepatic lesion. Innumerable stones layer within the gallbladder consistent with cholelithiasis. No secondary findings to suggest acute cholecystitis. Pancreas: Unremarkable. No pancreatic ductal dilatation or surrounding inflammatory changes. Spleen: Normal in size without focal abnormality. Adrenals/Urinary Tract: Normal adrenal glands. Bilateral circumscribed water attenuation renal lesions consistent with simple cysts. No evidence of hydronephrosis or enhancing renal mass. 3-4 mm stone in the left lower pole cortex. Numerous bladder stones. Diffusely thick walled bladder with multiple small cellules. Findings suggest prior bladder outlet obstruction. Stomach/Bowel: Colonic diverticular disease without CT evidence of active inflammation. Normal appendix in the right lower quadrant. Evidence of prior small bowel resection in the right lower quadrant. The enteroenteric anastomosis appears patent and unremarkable. No evidence of focal bowel wall thickening or obstruction. Lymphatic: No suspicious lymphadenopathy. Reproductive: The prostate gland is normal in size. Other: No abdominal wall hernia or abnormality. No abdominopelvic ascites. Musculoskeletal: No acute fracture or  aggressive appearing lytic or blastic osseous lesion. Multilevel degenerative disc disease most severe at L4-L5 and L5-S1. Review of the MIP images confirms the above findings. IMPRESSION: CTA CHEST 1. Similar to slightly increased focal fusiform aneurysmal dilation of the distal aortic arch with a maximal diameter of 4.0 cm today compared to 3.9 cm previously. 2. Interval development of a multiple suspicious pulmonary nodules in the left upper and lower lobes. Several of the nodules demonstrate a spiculated contour although this may be due in part to the underlying emphysematous changes. Given the multiplicity, an infectious/inflammatory process is possible. However, multifocal primary bronchogenic carcinoma versus metastatic disease is difficult to exclude. Recommend short interval follow up with repeat chest CT in 4-6 weeks following an appropriate course of antibiotic therapy. If the nodules persist or enlarge, then PET-CT would be warranted. 3. 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 4. Coronary artery calcifications. 5. Stable right-sided calcified pleural plaques. CTA ABD/PELVIS 1. 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 2. 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 3. 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 4. Stable ectasia of the bilateral common femoral arteries. 5. Additional ancillary findings as above without significant interval change. Aortic Atherosclerosis (ICD10-I70.0) and Emphysema (ICD10-J43.9); Aortic aneurysm NOS (ICD10-I71.9). These results of CTA CHEST IMPRESSION #2 above were called by telephone at the time of  interpretation on 03/23/2019 at 3:36 pm to Dr. Olga Millers , who verbally acknowledged these results. Signed, Sterling Big, MD, RPVI [facility] Electronically Signed   By: Malachy Moan  M.D.   On: 03/23/2019 15:37   Ct Angio Abd/pel W/ And/or W/o  Result Date: 03/23/2019 CLINICAL DATA:  78 year old male with a history of thoracic and right iliac artery aneurysms. EXAM: CT ANGIOGRAPHY CHEST, ABDOMEN AND PELVIS TECHNIQUE: Multidetector CT imaging through the chest, abdomen and pelvis was performed using the standard protocol during bolus administration of intravenous contrast. Multiplanar reconstructed images and MIPs were obtained and reviewed to evaluate the vascular anatomy. CONTRAST:  100mL OMNIPAQUE IOHEXOL 350 MG/ML SOLN COMPARISON:  Prior CTA chest abdomen and pelvis 01/22/2017 FINDINGS: CTA CHEST FINDINGS Cardiovascular: Conventional 3 vessel arch anatomy. The aortic root is normal in caliber. There is some thickening and calcification of the aortic valve. The ascending thoracic aorta is normal in caliber. There is focal fusiform aneurysmal dilation of the distal arch at the origin of the left subclavian artery which measures up to 4.0 cm, slightly increased compared to 3.9 cm previously. Normal caliber main pulmonary artery. Calcifications are present along the left anterior descending and right coronary arteries. The heart is normal in size. No pericardial effusion. Mediastinum/Nodes: Unremarkable CT appearance of the thyroid gland. No suspicious mediastinal or hilar adenopathy. No soft tissue mediastinal mass. The thoracic esophagus is unremarkable. Lungs/Pleura: Extensive calcified pleural plaques in the right hemithorax beginning at the lung apex and extending along the anterior and posterior pleural surface to the lung base posteriorly. Moderately severe combined paraseptal and centrilobular pulmonary emphysema.  Pleuroparenchymal scarring at the left lung apex. There is a new nodular opacity in the left lung apex with subtle spiculation of the margin and central low attenuation concerning for central necrosis. The nodule measures 1.4 x 0.9 cm and was not present on the prior CT scan. No pleural effusion or pneumothorax. Multiple (at least 6) new nodular opacities are now visualized in the left lower lobe which were not identified on the prior study. Several of the nodules are somewhat fusiform in appearance and appear to be branching. The largest individual nodule measures up to 2.3 cm. Musculoskeletal: No acute fracture or aggressive appearing lytic or blastic osseous lesion. Review of the MIP images confirms the above findings. CTA ABDOMEN AND PELVIS FINDINGS VASCULAR Aorta: Multifocal penetrating aortic ulcers as previously noted. The maximal diameter of the aorta at a site of penetrating aortic ulceration just proximal to the origin the SMA measures 3.3 cm, slightly enlarged compared to 3.1 cm previously. Surgical changes of prior aorto bi-iliac bypass graft again noted. The anastomoses are patent without evidence of complication. Celiac: Patent without evidence of aneurysm, dissection, vasculitis or significant stenosis. SMA: Patent without evidence of aneurysm, dissection, vasculitis or significant stenosis. Renals: Solitary renal arteries bilaterally. 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 The right renal artery remains normal in appearance. IMA: Patent without evidence of aneurysm, dissection, vasculitis or significant stenosis. Inflow: Slight interval progression of aneurysmal dilation of the right internal iliac artery now measuring up to 2.3 cm compared to 2.1 cm previously. 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 Ectatic bilateral common femoral arteries remain stable. Veins: No focal venous abnormality. Review of the MIP  images confirms the above findings. NON-VASCULAR Hepatobiliary: Normal hepatic contour and morphology. No discrete hepatic lesion. Innumerable stones layer within the gallbladder consistent with cholelithiasis. No secondary findings to suggest acute cholecystitis. Pancreas: Unremarkable. No pancreatic ductal dilatation or surrounding inflammatory changes. Spleen: Normal in size without focal abnormality. Adrenals/Urinary Tract: Normal adrenal glands. Bilateral circumscribed water attenuation  renal lesions consistent with simple cysts. No evidence of hydronephrosis or enhancing renal mass. 3-4 mm stone in the left lower pole cortex. Numerous bladder stones. Diffusely thick walled bladder with multiple small cellules. Findings suggest prior bladder outlet obstruction. Stomach/Bowel: Colonic diverticular disease without CT evidence of active inflammation. Normal appendix in the right lower quadrant. Evidence of prior small bowel resection in the right lower quadrant. The enteroenteric anastomosis appears patent and unremarkable. No evidence of focal bowel wall thickening or obstruction. Lymphatic: No suspicious lymphadenopathy. Reproductive: The prostate gland is normal in size. Other: No abdominal wall hernia or abnormality. No abdominopelvic ascites. Musculoskeletal: No acute fracture or aggressive appearing lytic or blastic osseous lesion. Multilevel degenerative disc disease most severe at L4-L5 and L5-S1. Review of the MIP images confirms the above findings. IMPRESSION: CTA CHEST 1. Similar to slightly increased focal fusiform aneurysmal dilation of the distal aortic arch with a maximal diameter of 4.0 cm today compared to 3.9 cm previously. 2. Interval development of a multiple suspicious pulmonary nodules in the left upper and lower lobes. Several of the nodules demonstrate a spiculated contour although this may be due in part to the underlying emphysematous changes. Given the multiplicity, an  infectious/inflammatory process is possible. However, multifocal primary bronchogenic carcinoma versus metastatic disease is difficult to exclude. Recommend short interval follow up with repeat chest CT in 4-6 weeks following an appropriate course of antibiotic therapy. If the nodules persist or enlarge, then PET-CT would be warranted. 3. 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 4. Coronary artery calcifications. 5. Stable right-sided calcified pleural plaques. CTA ABD/PELVIS 1. 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 2. 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 3. 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 4. Stable ectasia of the bilateral common femoral arteries. 5. Additional ancillary findings as above without significant interval change. Aortic Atherosclerosis (ICD10-I70.0) and Emphysema (ICD10-J43.9); Aortic aneurysm NOS (ICD10-I71.9). These results of CTA CHEST IMPRESSION #2 above were called by telephone at the time of interpretation on 03/23/2019 at 3:36 pm to Dr. Olga Millers , who verbally acknowledged these results. Signed, Sterling Big, MD, RPVI [facility] Radiology Electronically Signed   By: Malachy Moan M.D.   On: 03/23/2019 15:37      PFT Results Latest Ref Rng & Units 11/16/2015 12/30/2013  FVC-Pre L 2.96 3.01  FVC-Predicted Pre % 59 59  FVC-Post L 2.89 3.01  FVC-Predicted Post % 58 59  Pre FEV1/FVC % % 43 45  Post FEV1/FCV % % 46 49  FEV1-Pre L 1.28 1.36  FEV1-Predicted Pre % 35 36  FEV1-Post L 1.33 1.48  DLCO UNC% % 26 26  DLCO COR %Predicted  % 40 39  TLC L - 6.31  TLC % Predicted % - 80  RV % Predicted % - 80    No results found for: NITRICOXIDE      Assessment & Plan:   COPD (chronic obstructive pulmonary disease) (HCC) COPD appears to be stable.  Will continue on current regimen  Plan  Patient Instructions  Augmentin  Twice daily  For 1 week, take with food.  Labs today .  Continue on Spiriva and Striverdi daily , rinse after use .  Continue on Oxygen 2l/m Continue on CPAP At bedtime  With oxygen .  Order for new CPAP machine .  Wear each night for at least 6 hr each night .  Plan for CT chest in 3 months  Follow up in 4 weeks with Yomaira Solar NP or Dr. Vassie Loll with chest xray  Please contact office for sooner follow up if symptoms do not improve or worsen or seek emergency care       OSA on CPAP Controlled on CPAP with good compliance  Order for new CPAP machine Plan  Patient Instructions  Augmentin 875mg  Twice daily  For 1 week, take with food.  Labs today .  Continue on Spiriva and Striverdi daily , rinse after use .  Continue on Oxygen 2l/m Continue on CPAP At bedtime  With oxygen .  Order for new CPAP machine .  Wear each night for at least 6 hr each night .  Plan for CT chest in 3 months  Follow up in 4 weeks with Parmvir Boomer NP or Dr. Vassie Loll with chest xray  Please contact office for sooner follow up if symptoms do not improve or worsen or seek emergency care       Chronic respiratory failure with hypoxia (HCC) Compensated on present regimen Continue on oxygen O2 saturation goal greater than 88 to 90%   Lung nodules New lung nodules noted on CT chest largest in the left apex maximum measuring at 1.4 cm Possible underlying infectious component. We will treat with course of antibiotics And follow-up CT chest in 3 months. Case discussed with Dr. Vassie Loll.  Plan  Patient Instructions  Augmentin 875mg  Twice daily  For 1 week, take with food.  Labs today .  Continue on Spiriva and Striverdi  daily , rinse after use .  Continue on Oxygen 2l/m Continue on CPAP At bedtime  With oxygen .  Order for new CPAP machine .  Wear each night for at least 6 hr each night .  Plan for CT chest in 3 months  Follow up in 4 weeks with Squire Withey NP or Dr. Vassie Loll with chest xray  Please contact office for sooner follow up if symptoms do not improve or worsen or seek emergency care          Rubye Oaks, NP 03/25/2019

## 2019-03-25 NOTE — Progress Notes (Signed)
Called spoke with patient, advised of lab results / recs as stated by TP.  Pt verbalized understanding and denied any questions.  Appt scheduled with TP for 9.29.2020 @ 1430 w/ cxr, CT moved out to 3 months.

## 2019-03-25 NOTE — Patient Instructions (Addendum)
Augmentin 875mg  Twice daily  For 1 week, take with food.  Labs today .  Continue on Spiriva and Striverdi daily , rinse after use .  Continue on Oxygen 2l/m Continue on CPAP At bedtime  With oxygen .  Order for new CPAP machine .  Wear each night for at least 6 hr each night .  Plan for CT chest in 3 months  Follow up in 4 weeks with Korrina Zern NP or Dr. Elsworth Soho with chest xray  Please contact office for sooner follow up if symptoms do not improve or worsen or seek emergency care

## 2019-04-03 NOTE — Progress Notes (Signed)
Patient Care Team: Myrlene Broker, MD as PCP - General (Internal Medicine) Ernesto Rutherford, MD as Consulting Physician (Ophthalmology) Kalman Shan, MD as Consulting Physician (Pulmonary Disease) System, Provider Not In as Consulting Physician Casimer Lanius, MD as Consulting Physician (Rheumatology) Oretha Milch, MD as Consulting Physician (Pulmonary Disease) Lewayne Bunting, MD as Consulting Physician (Cardiology)  DIAGNOSIS:    ICD-10-CM   1. Thrombocytopenia (HCC)  D69.6 CBC with Differential (Cancer Center Only)    CHIEF COMPLIANT: Follow-up of severe anemia and thrombocytopenia  INTERVAL HISTORY: [name] is a 78 y.o. with above-mentioned history of severe anemia due to chronic kidney disease and thrombocytopenia. He was seen in the ED on 03/15/19 following a syncopal episode. Labs on 03/25/19 showed: Hemoglobin 10.7, MCV 85.2, WBC 4.0, platelets 37. He presents to the clinic today for follow-up to review his blood work.   REVIEW OF SYSTEMS:   Constitutional: Denies fevers, chills or abnormal weight loss Eyes: Denies blurriness of vision Ears, nose, mouth, throat, and face: Denies mucositis or sore throat Respiratory: Denies cough, dyspnea or wheezes Cardiovascular: Denies palpitation, chest discomfort Gastrointestinal: Denies nausea, heartburn or change in bowel habits Skin: Denies abnormal skin rashes Lymphatics: Denies new lymphadenopathy or easy bruising Neurological: Tremors and weakness, uses a cane to help with walking for his gait. Behavioral/Psych: Mood is stable, no new changes  Extremities: No lower extremity edema All other systems were reviewed with the patient and are negative.  I have reviewed the past medical history, past surgical history, social history and family history with the patient and they are unchanged from previous note.  ALLERGIES:  is allergic to no known allergies.  MEDICATIONS:  Current Outpatient Medications   Medication Sig Dispense Refill  . albuterol (PROVENTIL) (2.5 MG/3ML) 0.083% nebulizer solution 1 vial in nebulizer every 6 hours as needed Dx 496 (Patient taking differently: Take 2.5 mg by nebulization every 6 (six) hours as needed for wheezing or shortness of breath. 1 vial in nebulizer every 6 hours as needed Dx 496) 120 mL 6  . ALPRAZolam (XANAX) 0.5 MG tablet Take 0.5 mg by mouth at bedtime.     [name] apixaban (ELIQUIS) 5 MG TABS tablet Take 1 tablet (5 mg total) by mouth 2 (two) times daily. 180 tablet 2  . atorvastatin (LIPITOR) 40 MG tablet Take 1 tablet (40 mg total) by mouth daily. 90 tablet 3  . Carboxymethylcellul-Glycerin (LUBRICATING EYE DROPS OP) Apply 1 drop to eye daily as needed (dry eyes).    . Cholecalciferol (VITAMIN D3) 2000 units capsule Take 2,000 Units by mouth daily.     . insulin glargine (LANTUS) 100 UNIT/ML injection Inject 0.4 mLs (40 Units total) into the skin at bedtime. (Patient taking differently: Inject 50 Units into the skin at bedtime. ) 10 mL 0  . levothyroxine (SYNTHROID) 125 MCG tablet Take 125 mcg by mouth daily before breakfast.     . losartan (COZAAR) 50 MG tablet Take 1 tablet (50 mg total) by mouth daily. 90 tablet 3  . metFORMIN (GLUCOPHAGE) 500 MG tablet Take 500 mg by mouth 2 (two) times daily with a meal.    . Multiple Vitamins-Minerals (MEGA MULTIVITAMIN FOR MEN PO) Take 1 tablet by mouth daily.     . Olodaterol HCl (STRIVERDI RESPIMAT) 2.5 MCG/ACT AERS Inhale 1 puff into the lungs daily.    . OXYGEN Inhale 2 L into the lungs continuous.    . Semaglutide (OZEMPIC) 1 MG/DOSE SOPN Inject 1 mg into the skin  once a week.    . tamsulosin (FLOMAX) 0.4 MG CAPS capsule Take 1 capsule (0.4 mg total) by mouth at bedtime. 30 capsule 0  . tiotropium (SPIRIVA HANDIHALER) 18 MCG inhalation capsule Place 1 capsule (18 mcg total) into inhaler and inhale daily. 30 capsule 4   No current facility-administered medications for this visit.     PHYSICAL EXAMINATION:  ECOG PERFORMANCE STATUS: 1 - Symptomatic but completely ambulatory  Vitals:   04/05/19 1110  BP: (!) 157/77  Pulse: 71  Resp: 18  Temp: 98.5 F (36.9 C)  SpO2: 99%   Filed Weights   04/05/19 1110  Weight: 214 lb 14.4 oz (97.5 kg)    GENERAL: alert, no distress and comfortable SKIN: skin color, texture, turgor are normal, no rashes or significant lesions EYES: normal, Conjunctiva are pink and non-injected, sclera clear OROPHARYNX: no exudate, no erythema and lips, buccal mucosa, and tongue normal  NECK: supple, thyroid normal size, non-tender, without nodularity LYMPH: no palpable lymphadenopathy in the cervical, axillary or inguinal LUNGS: clear to auscultation and percussion with normal breathing effort HEART: regular rate & rhythm and no murmurs and no lower extremity edema ABDOMEN: abdomen soft, non-tender and normal bowel sounds MUSCULOSKELETAL: no cyanosis of digits and no clubbing  NEURO: Tremors and peripheral neuropathy EXTREMITIES: No lower extremity edema  LABORATORY DATA:  I have reviewed the data as listed CMP Latest Ref Rng & Units 03/15/2019 09/23/2018 07/15/2018  Glucose 70 - 99 mg/dL 257(H) 121(H) 114(H)  BUN 8 - 23 mg/dL 25(H) 27 18  Creatinine 0.61 - 1.24 mg/dL 1.36(H) 1.23 1.12  Sodium 135 - 145 mmol/L 135 142 142  Potassium 3.5 - 5.1 mmol/L 4.4 4.5 4.5  Chloride 98 - 111 mmol/L 101 102 107  CO2 22 - 32 mmol/L 23 24 26   Calcium 8.9 - 10.3 mg/dL 9.3 9.3 9.5  Total Protein 6.5 - 8.1 g/dL - - 6.8  Total Bilirubin 0.3 - 1.2 mg/dL - - 0.6  Alkaline Phos 38 - 126 U/L - - 97  AST 15 - 41 U/L - - 22  ALT 0 - 44 U/L - - 15    Lab Results  Component Value Date   WBC 6.4 04/05/2019   HGB 10.8 (L) 04/05/2019   HCT 34.0 (L) 04/05/2019   MCV 88.5 04/05/2019   PLT 48 (L) 04/05/2019   NEUTROABS 3.2 04/05/2019    ASSESSMENT & PLAN:  Thrombocytopenia ([place]) 02/18/2018: Hemoglobin 8.6, MCV 83, WBC 3.5, platelets 86 creatinine 1.83 02/26/2018: Hemoglobin 9.1,  platelets 92, MCV 85 03/11/2018: Hemoglobin 9.5, platelets 75, MCV 84, WBC 3 04/15/2018: Hemoglobin is 10.3, platelets 75 WBC 3.8 07/15/2018: Hemoglobin 10.6, platelets 63, WBC 4.3 01/03/2019: Hemoglobin 11.3, platelets 49, WBC 4.4, normal differential 03/25/2019: Hemoglobin 10.7, platelet count 37 04/05/2019: Hemoglobin 10.8, platelets 48  I reviewed the lab work with the patient  Patient suspects that the thrombocytopenia could be related to Lyrica.  I reviewed the adverse effects of Lyrica and thrombocytopenia is unknown side effect of the drug.  He will stop it permanently and will call his [place] doctors to see if there are any alternatives.  I discussed with him about Cymbalta as another option for neuropathy.  With improvement in platelet count, we elected to watch and monitor him. Return to clinic in 3 months with labs and follow-up.   Orders Placed This Encounter  Procedures  . CBC with Differential (Cancer Center Only)    Standing Status:   Future    Standing Expiration  Date:   04/04/2020   The patient has a good understanding of the overall plan. he agrees with it. he will call with any problems that may develop before the next visit here.  Serena Croissant, MD 04/05/2019  Jenness Corner Dorshimer am acting as scribe for Dr. Serena Croissant.  I have reviewed the above documentation for accuracy and completeness, and I agree with the above.

## 2019-04-05 ENCOUNTER — Other Ambulatory Visit: Payer: Self-pay

## 2019-04-05 ENCOUNTER — Inpatient Hospital Stay: Payer: Medicare Other

## 2019-04-05 ENCOUNTER — Inpatient Hospital Stay: Payer: Medicare Other | Attending: Hematology and Oncology | Admitting: Hematology and Oncology

## 2019-04-05 DIAGNOSIS — Z79899 Other long term (current) drug therapy: Secondary | ICD-10-CM | POA: Diagnosis not present

## 2019-04-05 DIAGNOSIS — D631 Anemia in chronic kidney disease: Secondary | ICD-10-CM | POA: Diagnosis not present

## 2019-04-05 DIAGNOSIS — Z7901 Long term (current) use of anticoagulants: Secondary | ICD-10-CM | POA: Insufficient documentation

## 2019-04-05 DIAGNOSIS — D696 Thrombocytopenia, unspecified: Secondary | ICD-10-CM | POA: Insufficient documentation

## 2019-04-05 DIAGNOSIS — I129 Hypertensive chronic kidney disease with stage 1 through stage 4 chronic kidney disease, or unspecified chronic kidney disease: Secondary | ICD-10-CM | POA: Diagnosis not present

## 2019-04-05 DIAGNOSIS — Z794 Long term (current) use of insulin: Secondary | ICD-10-CM | POA: Insufficient documentation

## 2019-04-05 DIAGNOSIS — N189 Chronic kidney disease, unspecified: Secondary | ICD-10-CM | POA: Diagnosis not present

## 2019-04-05 LAB — CBC WITH DIFFERENTIAL (CANCER CENTER ONLY)
Abs Immature Granulocytes: 0.03 10*3/uL (ref 0.00–0.07)
Basophils Absolute: 0.1 10*3/uL (ref 0.0–0.1)
Basophils Relative: 1 %
Eosinophils Absolute: 1 10*3/uL — ABNORMAL HIGH (ref 0.0–0.5)
Eosinophils Relative: 16 %
HCT: 34 % — ABNORMAL LOW (ref 39.0–52.0)
Hemoglobin: 10.8 g/dL — ABNORMAL LOW (ref 13.0–17.0)
Immature Granulocytes: 1 %
Lymphocytes Relative: 23 %
Lymphs Abs: 1.4 10*3/uL (ref 0.7–4.0)
MCH: 28.1 pg (ref 26.0–34.0)
MCHC: 31.8 g/dL (ref 30.0–36.0)
MCV: 88.5 fL (ref 80.0–100.0)
Monocytes Absolute: 0.6 10*3/uL (ref 0.1–1.0)
Monocytes Relative: 10 %
Neutro Abs: 3.2 10*3/uL (ref 1.7–7.7)
Neutrophils Relative %: 49 %
Platelet Count: 48 10*3/uL — ABNORMAL LOW (ref 150–400)
RBC: 3.84 MIL/uL — ABNORMAL LOW (ref 4.22–5.81)
RDW: 14.7 % (ref 11.5–15.5)
WBC Count: 6.4 10*3/uL (ref 4.0–10.5)
nRBC: 0 % (ref 0.0–0.2)

## 2019-04-05 LAB — IMMATURE PLATELET FRACTION: Immature Platelet Fraction: 3.9 % (ref 1.2–8.6)

## 2019-04-05 NOTE — Assessment & Plan Note (Addendum)
02/18/2018: Hemoglobin 8.6, MCV 83, WBC 3.5, platelets 86 creatinine 1.83 02/26/2018: Hemoglobin 9.1, platelets 92, MCV 85 03/11/2018: Hemoglobin 9.5, platelets 75, MCV 84, WBC 3 04/15/2018: Hemoglobin is 10.3, platelets 75 WBC 3.8 07/15/2018: Hemoglobin 10.6, platelets 63, WBC 4.3 01/03/2019: Hemoglobin 11.3, platelets 49, WBC 4.4, normal differential 03/25/2019: Hemoglobin 10.7, platelet count 37 04/05/2019:  I reviewed the lab work with the patient  Based on worsening thrombocytopenia, I recommended performing a bone marrow biopsy. 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

## 2019-04-06 ENCOUNTER — Telehealth: Payer: Self-pay | Admitting: Hematology and Oncology

## 2019-04-06 ENCOUNTER — Telehealth: Payer: Self-pay | Admitting: *Deleted

## 2019-04-06 NOTE — Telephone Encounter (Signed)
Received call from [place] with Dr. Laurell Roof at [facility] [phone] ext [phone]) requesting recent office note and labs from Dr. Lindi Adie.  Appropriate paperwork faxed to number provided [phone]

## 2019-04-06 NOTE — Telephone Encounter (Signed)
I left a message regarding schedule  

## 2019-04-16 NOTE — Progress Notes (Signed)
HPI: FU CAD and syncope.Cardiac cath 2/18 showed 30 LM, 50 LAD, aneurysmal RCA with 80 mid to distal; had DES to RCA at that time.Carotid dopplers 2/19 showed no significant stenosis. Admitted with sepsis 2/19 and also with atrial fibrillation, converted spontaneously to sinus; TSH low and synthroid adjusted. Placed on amiodarone.Amiodarone discontinued at previous office visit. Monitor performed May 2019 showed no significant arrhythmia. Echocardiogram August 2020 showed normal LV function.  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  Patient did see pulmonary for lung nodules and follow-up chest CT recommended 3 months, course of antibiotics prescribed.  Seen by Dr. Caryl Comes yesterday and pacemaker recommended for recent syncope in the setting of first-degree AV block/left bundle branch block.  Since last seen, patient denies increased dyspnea, chest pain, recurrent syncope or pedal edema.  Current Outpatient Medications  Medication Sig Dispense Refill  . albuterol (PROVENTIL) (2.5 MG/3ML) 0.083% nebulizer solution 1 vial in nebulizer every 6 hours as needed Dx 496 120 mL 6  . ALPRAZolam (XANAX) 0.5 MG tablet Take 0.5 mg by mouth at bedtime.     [name] apixaban (ELIQUIS) 5 MG TABS tablet Take 1 tablet (5 mg total) by mouth 2 (two) times daily. 180 tablet 2  . atorvastatin (LIPITOR) 40 MG tablet Take 1 tablet (40 mg total) by mouth daily. 90 tablet 3  . Carboxymethylcellul-Glycerin (LUBRICATING EYE DROPS OP) Apply 1 drop to eye daily as needed (dry eyes).    . Cholecalciferol (VITAMIN D3) 2000 units capsule Take 2,000 Units by mouth daily.     . insulin glargine (LANTUS) 100 UNIT/ML injection Inject 50 Units into the skin daily.    [name] levothyroxine (SYNTHROID) 125 MCG tablet Take 125 mcg by mouth daily before breakfast.      . losartan (COZAAR) 50 MG tablet Take 1 tablet (50 mg total) by mouth daily. 90 tablet 3  . metFORMIN (GLUCOPHAGE) 500 MG tablet Take 500 mg by mouth 2 (two) times daily with a meal.    . Multiple Vitamins-Minerals (MEGA MULTIVITAMIN FOR MEN PO) Take 1 tablet by mouth daily.     . Olodaterol HCl (STRIVERDI RESPIMAT) 2.5 MCG/ACT AERS Inhale 1 puff into the lungs daily.    . OXYGEN Inhale 2 L into the lungs continuous.    . Semaglutide (OZEMPIC) 1 MG/DOSE SOPN Inject 1 mg into the skin once a week.    . tamsulosin (FLOMAX) 0.4 MG CAPS capsule Take 1 capsule (0.4 mg total) by mouth at bedtime. 30 capsule 0  . tiotropium (SPIRIVA HANDIHALER) 18 MCG inhalation capsule Place 1 capsule (18 mcg total) into inhaler and inhale daily. 30 capsule 4   No current facility-administered medications for this visit.      Past Medical History:  Diagnosis Date  . 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 Anxiety   . Aortic aneurysm ([place]) 09/02/2017  . BENIGN PROSTATIC HYPERTROPHY 11/23/2009  . Cardiomyopathy ([place]) 08/28/2016  . Chronic systolic CHF (congestive heart failure) ([place]) 09/02/2017  . COPD (chronic obstructive pulmonary disease) ([place])   . CORONARY ARTERY DISEASE 11/23/2009  . 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 DEGENERATIVE JOINT DISEASE 11/23/2009  . DEPRESSION 11/23/2009  . FATIGUE 11/23/2009  . GAIT DISTURBANCE 12/10/2009  . HEMOPTYSIS UNSPECIFIED 05/07/2010  . High cholesterol   . HYPERTENSION 07/30/2009  . HYPOTHYROIDISM 07/30/2009  .  Ischemic cardiomyopathy 09/02/2017  . 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 On home oxygen therapy    "2-3L; 24/7" (09/10/2016)  . OSA on CPAP   . PTSD (post-traumatic stress disorder) 03/10/2012  . 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 RA (rheumatoid arthritis) (HCC) 06/11/2011   "qwhere" (09/10/2016)  . RESPIRATORY FAILURE, CHRONIC 07/31/2009  . Scleritis of both eyes 03/17/2014  . Thrombocytopenia (HCC)   . 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 Type II diabetes mellitus (HCC)      Past Surgical History:  Procedure Laterality Date  . 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 ABDOMINAL EXPLORATION SURGERY  02/2004   w/LOA/notes 12/10/2010; small bowel obstruction repair with adhesiolysis   . BACK SURGERY    . CARDIAC CATHETERIZATION     2 heart caths in the past.  One in 2000s showed one ulcerated plaque  Rx medically; [facility] [name] 09/05/2016  . CATARACT EXTRACTION W/ INTRAOCULAR LENS  IMPLANT, BILATERAL Bilateral 2000s  . COLECTOMY     hx of remote ileum resection due to bleeding  . COLONOSCOPY WITH PROPOFOL N/A 01/22/2018   Procedure: COLONOSCOPY WITH PROPOFOL;  Surgeon: Iva Boop, MD;  Location: [facility] ENDOSCOPY;  Service: Endoscopy;  Laterality: N/A;  . CORONARY ANGIOPLASTY WITH STENT PLACEMENT  09/10/2016  . CORONARY STENT INTERVENTION N/A 09/10/2016   Procedure: Coronary Stent Intervention;  Surgeon: Kathleene Hazel, MD;  Location[facility] INVASIVE CV LAB;  Service: Cardiovascular;  Laterality: N/A;  Distal RCA 4.0x16 Synergy  . ESOPHAGOGASTRODUODENOSCOPY (EGD) WITH PROPOFOL N/A 01/22/2018   Procedure: ESOPHAGOGASTRODUODENOSCOPY (EGD) WITH PROPOFOL;  Surgeon: Iva Boop, MD;  Location: [facility] ENDOSCOPY;  Service: Endoscopy;  Laterality: N/A;  . FEMORAL EMBOLOECTOMY Left 07/2000   with left leg ischemia; Dr. Hart Rochester, vascular  . GANGLION CYST EXCISION Right    "wrist"; Dr. Teressa Senter  . HOT HEMOSTASIS N/A 01/22/2018   Procedure: HOT HEMOSTASIS (ARGON PLASMA COAGULATION/BICAP);  Surgeon: Iva Boop, MD;  Location: [facility] ENDOSCOPY;  Service: Endoscopy;  Laterality: N/A;  . LUMBAR LAMINECTOMY  1972   Dr. Fannie Knee  . RIGHT/LEFT HEART CATH AND CORONARY ANGIOGRAPHY N/A 09/10/2016   Procedure: Right/Left Heart Cath and Coronary Angiography;  Surgeon: Kathleene Hazel, MD;  Location: [facility] INVASIVE CV LAB;  Service: Cardiovascular;  Laterality: N/A;  . TONSILLECTOMY      Social History   Socioeconomic History  . Marital status: Married     Spouse name: Not on file  . Number of children: Not on file  . Years of education: Not on file  . Highest education level: Not on file  Occupational History  . Occupation: disabled Cytogeneticist, Ex Neurosurgeon: RETIRED  Social Needs  . Financial resource strain: Not on file  . Food insecurity    Worry: Not on file    Inability: Not on file  . Transportation needs    Medical: Not on file    Non-medical: Not on file  Tobacco Use  . Smoking status: Former Smoker    Packs/day: 2.50    Years: 40.00    Pack years: 100.00    Types: Cigarettes, Pipe, Cigars    Quit date: 07/28/1998    Years since quitting: 20.7  . Smokeless tobacco: Never Used  Substance and Sexual Activity  . Alcohol use: No    Alcohol/week: 0.0 standard drinks  . Drug use: No  . Sexual activity: Not on file  Lifestyle  . Physical activity    Days per week:  Not on file    Minutes per session: Not on file  . Stress: Not on file  Relationships  . Social Musician on phone: Not on file    Gets together: Not on file    Attends religious service: Not on file    Active member of club or organization: Not on file    Attends meetings of clubs or organizations: Not on file    Relationship status: Not on file  . Intimate partner violence    Fear of current or ex partner: Not on file    Emotionally abused: Not on file    Physically abused: Not on file    Forced sexual activity: Not on file  Other Topics Concern  . Not on file  Social History Narrative   Lives in the home with his wife   Sees [place] every 6 month for meds.   Denies asbestos exposure    Family History  Problem Relation Age of Onset  . Other Mother        gun shot    ROS: no fevers or chills, productive cough, hemoptysis, dysphasia, odynophagia, melena, hematochezia, dysuria, hematuria, rash, seizure activity, orthopnea, PND, pedal edema, claudication. Remaining systems are negative.  Physical Exam: Well-developed  well-nourished in no acute distress.  Skin is warm and dry.  HEENT is normal.  Neck is supple.  Chest with diminished BS throughout Cardiovascular exam is regular rate and rhythm.  Abdominal exam nontender or distended. No masses palpated. Extremities show no edema. neuro grossly intact   A/P  1 previous syncopal episode-echocardiogram shows preserved LV function.  Seen by Dr. Graciela Husbands yesterday and pacemaker recommended given conduction abnormalities on baseline electrocardiogram.  Previously instructed not to drive for 6 months following most recent event.  2 coronary artery disease-no chest pain.  Continue statin.  No aspirin given need for anticoagulation.  3 paroxysmal atrial fibrillation-patient remains in sinus rhythm on examination.  Previous episode occurred in the setting of urosepsis.  Amiodarone was discontinued because of baseline lung disease.  Continue apixaban.  4 history of cardiomyopathy-LV function improved on most recent echo.  5 hypertension-blood pressure control.  Continue present medications.  6 hyperlipidemia-continue statin.  7 history of chronic combined systolic/diastolic congestive heart failure-patient remains euvolemic on examination.  8 thoracic/abdominal/iliac aneurysms-follow-up CTA August 2021.  9 pulmonary nodules-follow-up pulmonary.  Olga Millers, MD

## 2019-04-18 NOTE — Progress Notes (Signed)
ELECTROPHYSIOLOGY CONSULT NOTE  Patient ID: [name], MRN: [id number], DOB/AGE: 77-Sep-1942 77 y.o. Admit date: (Not on file) Date of Consult: 04/19/2019  Primary Physician: Myrlene Broker, MD Primary Cardiologist: [facility]  [name] is a 78 y.o. male who is being seen today for the evaluation of syncope at the request of BC.   Chief Complaint: syncope    HPI [name] is a 78 y.o. male referred for syncope in setting of known CAD with prior DES but normal LV function and LBBB  And 1AVB  He was standing outside third round to walk inside and abruptly lost consciousness.  According to him his wife thinks he was out for less than 1 minute.  There was no warning and no residual orthostatic intolerance   No prior syncope.  However, he has frequent lightheadedness associated with turning his head or looking up.  No palpitations.  Occasional chest discomfort not associated with exertion.  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  8/20 Echo  60-65% Normal LA size     Weight loss was intentional and is now stable  Past Medical History:  Diagnosis Date   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   ablated   Anxiety    Aortic aneurysm (HCC) 09/02/2017   BENIGN PROSTATIC HYPERTROPHY 11/23/2009   Cardiomyopathy (HCC) 08/28/2016   Chronic systolic CHF (congestive heart failure) (HCC) 09/02/2017   COPD (chronic obstructive pulmonary disease) (HCC)    CORONARY ARTERY DISEASE 11/23/2009   DECREASED HEARING, 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   DEGENERATIVE JOINT DISEASE 11/23/2009   DEPRESSION 11/23/2009   FATIGUE 11/23/2009   GAIT DISTURBANCE 12/10/2009   HEMOPTYSIS UNSPECIFIED 05/07/2010    High cholesterol    HYPERTENSION 07/30/2009   HYPOTHYROIDISM 07/30/2009   Ischemic cardiomyopathy 09/02/2017   LUMBAR RADICULOPATHY, 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   On home oxygen therapy    "2-3L; 24/7" (09/10/2016)   OSA on CPAP    PTSD (post-traumatic stress disorder) 03/10/2012   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   RA (rheumatoid arthritis) (HCC) 06/11/2011   "qwhere" (09/10/2016)   RESPIRATORY FAILURE, CHRONIC 07/31/2009   Scleritis of both eyes 03/17/2014   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   Type II diabetes mellitus [facility])       Surgical History:  Past Surgical History:  Procedure Laterality Date   ABDOMINAL AORTIC ANEURYSM REPAIR  07/2002   [name] 12/10/2010   ABDOMINAL EXPLORATION SURGERY  02/2004   w/LOA/notes 12/10/2010; small bowel obstruction repair with adhesiolysis    BACK SURGERY     CARDIAC CATHETERIZATION     2 heart caths in the past.  One in 2000s showed one ulcerated plaque  Rx medically; Second at [facility] /notes 09/05/2016   CATARACT EXTRACTION W/ INTRAOCULAR LENS  IMPLANT, BILATERAL Bilateral 2000s   COLECTOMY     hx of remote ileum resection due to bleeding   COLONOSCOPY WITH PROPOFOL N/A 01/22/2018   Procedure: COLONOSCOPY WITH PROPOFOL;  Surgeon: Iva Boop, MD;  Location: [facility];  Service: Endoscopy;  Laterality: N/A;   CORONARY ANGIOPLASTY WITH STENT PLACEMENT  09/10/2016  CORONARY STENT INTERVENTION N/A 09/10/2016   Procedure: Coronary Stent Intervention;  Surgeon: Kathleene Hazelhristopher D McAlhany, MD;  [facility];  Service: Cardiovascular;  Laterality: N/A;  Distal RCA 4.0x16 Synergy   ESOPHAGOGASTRODUODENOSCOPY (EGD) WITH PROPOFOL N/A 01/22/2018   Procedure: ESOPHAGOGASTRODUODENOSCOPY (EGD) WITH PROPOFOL;  Surgeon: Iva BoopGessner, Carl E, MD;  Location: [facility];  Service: Endoscopy;  Laterality: N/A;   FEMORAL EMBOLOECTOMY Left 07/2000   with left leg ischemia; Dr. Hart RochesterLawson, vascular   GANGLION CYST EXCISION Right    "wrist"; Dr. Teressa SenterSypher   HOT HEMOSTASIS  N/A 01/22/2018   Procedure: HOT HEMOSTASIS (ARGON PLASMA COAGULATION/BICAP);  Surgeon: Iva BoopGessner, Carl E, MD;  [facility];  Service: Endoscopy;  Laterality: N/A;   LUMBAR LAMINECTOMY  1972   Dr. Fannie KneeSue   RIGHT/LEFT HEART CATH AND CORONARY ANGIOGRAPHY N/A 09/10/2016   Procedure: Right/Left Heart Cath and Coronary Angiography;  Surgeon: Kathleene Hazelhristopher D McAlhany, MD;  [facility];  Service: Cardiovascular;  Laterality: N/A;   TONSILLECTOMY       Home Meds: Prior to Admission medications   Medication Sig Start Date End Date Taking? Authorizing Provider  albuterol (PROVENTIL) (2.5 MG/3ML) 0.083% nebulizer solution 1 vial in nebulizer every 6 hours as needed Dx 496 Patient taking differently: Take 2.5 mg by nebulization every 6 (six) hours as needed for wheezing or shortness of breath. 1 vial in nebulizer every 6 hours as needed Dx 496 05/31/12   Kalman Shanamaswamy, Murali, MD  ALPRAZolam Prudy Feeler(XANAX) 0.5 MG tablet Take 0.5 mg by mouth at bedtime.     [provider]  apixaban (ELIQUIS) 5 MG TABS tablet Take 1 tablet (5 mg total) by mouth 2 (two) times daily. 01/18/19   Lewayne Buntingrenshaw, Brian S, MD  atorvastatin (LIPITOR) 40 MG tablet Take 1 tablet (40 mg total) by mouth daily. 10/21/16   Pricilla Riffleoss, Paula V, MD  Carboxymethylcellul-Glycerin (LUBRICATING EYE DROPS OP) Apply 1 drop to eye daily as needed (dry eyes).    [provider]  Cholecalciferol (VITAMIN D3) 2000 units capsule Take 2,000 Units by mouth daily.  01/11/16   [provider]  insulin glargine (LANTUS) 100 UNIT/ML injection Inject 0.4 mLs (40 Units total) into the skin at bedtime. Patient taking differently: Inject 50 Units into the skin at bedtime.  09/05/17   Rodolph Bonghompson, Daniel V, MD  levothyroxine (SYNTHROID) 125 MCG tablet Take 125 mcg by mouth daily before breakfast.     [provider]  losartan (COZAAR) 50 MG tablet Take 1 tablet (50 mg total) by mouth daily. 09/08/18   Lewayne Buntingrenshaw, Brian S, MD  metFORMIN  (GLUCOPHAGE) 500 MG tablet Take 500 mg by mouth 2 (two) times daily with a meal.    [provider]  Multiple Vitamins-Minerals (MEGA MULTIVITAMIN FOR MEN PO) Take 1 tablet by mouth daily.     [provider]  Olodaterol HCl (STRIVERDI RESPIMAT) 2.5 MCG/ACT AERS Inhale 1 puff into the lungs daily. 04/08/18   Parrett, Virgel Bouquetammy S, NP  OXYGEN Inhale 2 L into the lungs continuous.    [provider]  Semaglutide (OZEMPIC) 1 MG/DOSE SOPN Inject 1 mg into the skin once a week. 01/29/18   Sharlene DoryWendling, Nicholas Paul, DO  tamsulosin (FLOMAX) 0.4 MG CAPS capsule Take 1 capsule (0.4 mg total) by mouth at bedtime. 09/14/17   Sharlene DoryWendling, Nicholas Paul, DO  tiotropium (SPIRIVA HANDIHALER) 18 MCG inhalation capsule Place 1 capsule (18 mcg total) into inhaler and inhale daily. 07/16/17   Parrett, Virgel Bouquetammy S, NP  Allergies:  Allergies  Allergen Reactions   No Known Allergies     Social History   Socioeconomic History   Marital status: Married    Spouse name: Not on file   Number of children: Not on file   Years of education: Not on file   Highest education level: Not on file  Occupational History   Occupation: disabled veteran, Ex Neurosurgeon: RETIRED  Social Needs   Financial resource strain: Not on file   Food insecurity    Worry: Not on file    Inability: Not on file   Transportation needs    Medical: Not on file    Non-medical: Not on file  Tobacco Use   Smoking status: Former Smoker    Packs/day: 2.50    Years: 40.00    Pack years: 100.00    Types: Cigarettes, Pipe, Cigars    Quit date: 07/28/1998    Years since quitting: 20.7   Smokeless tobacco: Never Used  Substance and Sexual Activity   Alcohol use: No    Alcohol/week: 0.0 standard drinks   Drug use: No   Sexual activity: Not on file  Lifestyle   Physical activity    Days per week: Not on file    Minutes per session: Not on file   Stress: Not on file  Relationships   Social  connections    Talks on phone: Not on file    Gets together: Not on file    Attends religious service: Not on file    Active member of club or organization: Not on file    Attends meetings of clubs or organizations: Not on file    Relationship status: Not on file   Intimate partner violence    Fear of current or ex partner: Not on file    Emotionally abused: Not on file    Physically abused: Not on file    Forced sexual activity: Not on file  Other Topics Concern   Not on file  Social History Narrative   Lives in the home with his wife   Sees [place] every 6 month for meds.   Denies asbestos exposure     Family History  Problem Relation Age of Onset   Other Mother        gun shot     ROS:  Please see the history of present illness.     All other systems reviewed and negative.    Physical Exam:  Blood pressure 138/88, pulse 68, height  (1.88 m), weight 213 lb 6.4 oz (96.8 kg), SpO2 95 %. General: Well developed, well nourished male in no acute distress. Head: Normocephalic, atraumatic, sclera non-icteric, no xanthomas, nares are without discharge. EENT: normal Lymph Nodes:  none Back: without scoliosis/kyphosis , no CVA tendersness Neck: Negative for carotid bruits. JVD not elevated. Lungs: Clear bilaterally to auscultation without wheezes, rales, or rhonchi. Breathing is unlabored. Heart: RRR with S1 S2. No  murmur , rubs, or gallops appreciated. Abdomen: Soft, non-tender, non-distended with normoactive bowel sounds. No hepatomegaly. No rebound/guarding. No obvious abdominal masses. Msk:  Strength and tone appear normal for age. Extremities: No clubbing or cyanosis. tr edema.  Distal pedal pulses are 2+ and equal bilaterally. Skin: Warm and Dry Neuro: Alert and oriented X 3. CN III-XII intact Grossly normal sensory and motor function . Psych:  Responds to questions appropriately with a normal affect.      Labs: Cardiac Enzymes No results for input(s): CKTOTAL,  CKMB, TROPONINI in the last 72 hours. CBC Lab Results  Component Value Date   WBC 6.4 04/05/2019   HGB 10.8 (L) 04/05/2019   HCT 34.0 (L) 04/05/2019   MCV 88.5 04/05/2019   PLT 48 (L) 04/05/2019   PROTIME: No results for input(s): LABPROT, INR in the last 72 hours. Chemistry No results for input(s): NA, K, CL, CO2, BUN, CREATININE, CALCIUM, PROT, BILITOT, ALKPHOS, ALT, AST, GLUCOSE in the last 168 hours.  Invalid input(s): LABALBU Lipids Lab Results  Component Value Date   CHOL 111 02/17/2018   HDL 26 (A) 02/17/2018   LDLCALC 63 02/17/2018   TRIG 112 02/17/2018   BNP Pro B Natriuretic peptide (BNP)  Date/Time Value Ref Range Status  05/04/2011 05:17 AM 59.9 0 - 125 pg/mL Final  03/20/2011 12:00 PM 9.0 0.0 - 100.0 pg/mL Final  02/17/2011 12:55 AM 58.7 0 - 125 pg/mL Final  12/17/2009 03:37 PM <30.0 0.0 - 100.0 pg/mL Final   NT-Pro BNP  Date/Time Value Ref Range Status  10/20/2016 10:38 AM 188 0 - 486 pg/mL Final    Comment:    The following cut-points have been suggested for the use of proBNP for the diagnostic evaluation of heart failure (HF) in patients with acute dyspnea: Modality                     Age           Optimal Cut                            (years)            Point ------------------------------------------------------ Diagnosis (rule in HF)        <50            450 pg/mL                           50 - 75            900 pg/mL                               >75           1800 pg/mL Exclusion (rule out HF)  Age independent     300 pg/mL    Thyroid Function Tests: No results for input(s): TSH, T4TOTAL, T3FREE, THYROIDAB in the last 72 hours.  Invalid input(s): FREET3    Miscellaneous No results found for: DDIMER  Radiology/Studies:  Ct Angio Chest Aorta W &/or Wo Contrast  Result Date: 03/23/2019 CLINICAL DATA:  78 year old male with a history of thoracic and right iliac artery aneurysms. EXAM: CT ANGIOGRAPHY CHEST, ABDOMEN AND PELVIS TECHNIQUE:  Multidetector CT imaging through the chest, abdomen and pelvis was performed using the standard protocol during bolus administration of intravenous contrast. Multiplanar reconstructed images and MIPs were obtained and reviewed to evaluate the vascular anatomy. CONTRAST:  100mL OMNIPAQUE IOHEXOL 350 MG/ML SOLN COMPARISON:  Prior CTA chest abdomen and pelvis 01/22/2017 FINDINGS: CTA CHEST FINDINGS Cardiovascular: Conventional 3 vessel arch anatomy. The aortic root is normal in caliber. There is some thickening and calcification of the aortic valve. The ascending thoracic aorta is normal in caliber. There is focal fusiform aneurysmal dilation of the distal arch at the origin of the left subclavian artery which measures up to 4.0 cm, slightly increased compared to  3.9 cm previously. Normal caliber main pulmonary artery. Calcifications are present along the left anterior descending and right coronary arteries. The heart is normal in size. No pericardial effusion. Mediastinum/Nodes: Unremarkable CT appearance of the thyroid gland. No suspicious mediastinal or hilar adenopathy. No soft tissue mediastinal mass. The thoracic esophagus is unremarkable. Lungs/Pleura: Extensive calcified pleural plaques in the right hemithorax beginning at the lung apex and extending along the anterior and posterior pleural surface to the lung base posteriorly. Moderately severe combined paraseptal and centrilobular pulmonary emphysema. Pleuroparenchymal scarring at the left lung apex. There is a new nodular opacity in the left lung apex with subtle spiculation of the margin and central low attenuation concerning for central necrosis. The nodule measures 1.4 x 0.9 cm and was not present on the prior CT scan. No pleural effusion or pneumothorax. Multiple (at least 6) new nodular opacities are now visualized in the left lower lobe which were not identified on the prior study. Several of the nodules are somewhat fusiform in appearance and appear  to be branching. The largest individual nodule measures up to 2.3 cm. Musculoskeletal: No acute fracture or aggressive appearing lytic or blastic osseous lesion. Review of the MIP images confirms the above findings. CTA ABDOMEN AND PELVIS FINDINGS VASCULAR Aorta: Multifocal penetrating aortic ulcers as previously noted. The maximal diameter of the aorta at a site of penetrating aortic ulceration just proximal to the origin the SMA measures 3.3 cm, slightly enlarged compared to 3.1 cm previously. Surgical changes of prior aorto bi-iliac bypass graft again noted. The anastomoses are patent without evidence of complication. Celiac: Patent without evidence of aneurysm, dissection, vasculitis or significant stenosis. SMA: Patent without evidence of aneurysm, dissection, vasculitis or significant stenosis. Renals: Solitary renal arteries bilaterally. 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 The right renal artery remains normal in appearance. IMA: Patent without evidence of aneurysm, dissection, vasculitis or significant stenosis. Inflow: Slight interval progression of aneurysmal dilation of the right internal iliac artery now measuring up to 2.3 cm compared to 2.1 cm previously. 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 Ectatic bilateral common femoral arteries remain stable. Veins: No focal venous abnormality. Review of the MIP images confirms the above findings. NON-VASCULAR Hepatobiliary: Normal hepatic contour and morphology. No discrete hepatic lesion. Innumerable stones layer within the gallbladder consistent with cholelithiasis. No secondary findings to suggest acute cholecystitis. Pancreas: Unremarkable. No pancreatic ductal dilatation or surrounding inflammatory changes. Spleen: Normal in size without focal abnormality. Adrenals/Urinary Tract: Normal adrenal glands. Bilateral circumscribed water attenuation renal lesions consistent  with simple cysts. No evidence of hydronephrosis or enhancing renal mass. 3-4 mm stone in the left lower pole cortex. Numerous bladder stones. Diffusely thick walled bladder with multiple small cellules. Findings suggest prior bladder outlet obstruction. Stomach/Bowel: Colonic diverticular disease without CT evidence of active inflammation. Normal appendix in the right lower quadrant. Evidence of prior small bowel resection in the right lower quadrant. The enteroenteric anastomosis appears patent and unremarkable. No evidence of focal bowel wall thickening or obstruction. Lymphatic: No suspicious lymphadenopathy. Reproductive: The prostate gland is normal in size. Other: No abdominal wall hernia or abnormality. No abdominopelvic ascites. Musculoskeletal: No acute fracture or aggressive appearing lytic or blastic osseous lesion. Multilevel degenerative disc disease most severe at L4-L5 and L5-S1. Review of the MIP images confirms the above findings. IMPRESSION: CTA CHEST 1. Similar to slightly increased focal fusiform aneurysmal dilation of the distal aortic arch with a  maximal diameter of 4.0 cm today compared to 3.9 cm previously. 2. Interval development of a multiple suspicious pulmonary nodules in the left upper and lower lobes. Several of the nodules demonstrate a spiculated contour although this may be due in part to the underlying emphysematous changes. Given the multiplicity, an infectious/inflammatory process is possible. However, multifocal primary bronchogenic carcinoma versus metastatic disease is difficult to exclude. Recommend short interval follow up with repeat chest CT in 4-6 weeks following an appropriate course of antibiotic therapy. If the nodules persist or enlarge, then PET-CT would be warranted. 3. 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 4. Coronary artery calcifications. 5. Stable right-sided calcified pleural plaques. CTA ABD/PELVIS 1. Slight interval  progression of abdominal aortic aneurysmal dilation at the site of a penetrating aortic ulceration (just cephalad to the origin of the SMA) with a maximal diameter of 3.3 cm compared to 3.1 cm previously. 2. 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 3. 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 4. Stable ectasia of the bilateral common femoral arteries. 5. Additional ancillary findings as above without significant interval change. Aortic Atherosclerosis (ICD10-I70.0) and Emphysema (ICD10-J43.9); Aortic aneurysm NOS (ICD10-I71.9). These results of CTA CHEST IMPRESSION #2 above were called by telephone at the time of interpretation on 03/23/2019 at 3:36 pm to Dr. Olga Millers , who verbally acknowledged these results. Signed, Sterling Big, MD, RPVI [facility] Radiology Electronically Signed   By: Malachy Moan M.D.   On: 03/23/2019 15:37   Ct Angio Abd/pel W/ And/or W/o  Result Date: 03/23/2019 CLINICAL DATA:  78 year old male with a history of thoracic and right iliac artery aneurysms. EXAM: CT ANGIOGRAPHY CHEST, ABDOMEN AND PELVIS TECHNIQUE: Multidetector CT imaging through the chest, abdomen and pelvis was performed using the standard protocol during bolus administration of intravenous contrast. Multiplanar reconstructed images and MIPs were obtained and reviewed to evaluate the vascular anatomy. CONTRAST:  OMNIPAQUE IOHEXOL 350 MG/ML SOLN COMPARISON:  Prior CTA chest abdomen and pelvis 01/22/2017 FINDINGS: CTA CHEST FINDINGS Cardiovascular: Conventional 3 vessel arch anatomy. The aortic root is normal in caliber. There is some thickening and calcification of the aortic valve. The ascending thoracic aorta is normal in caliber. There is focal fusiform aneurysmal dilation of the  distal arch at the origin of the left subclavian artery which measures up to 4.0 cm, slightly increased compared to 3.9 cm previously. Normal caliber main pulmonary artery. Calcifications are present along the left anterior descending and right coronary arteries. The heart is normal in size. No pericardial effusion. Mediastinum/Nodes: Unremarkable CT appearance of the thyroid gland. No suspicious mediastinal or hilar adenopathy. No soft tissue mediastinal mass. The thoracic esophagus is unremarkable. Lungs/Pleura: Extensive calcified pleural plaques in the right hemithorax beginning at the lung apex and extending along the anterior and posterior pleural surface to the lung base posteriorly. Moderately severe combined paraseptal and centrilobular pulmonary emphysema. Pleuroparenchymal scarring at the left lung apex. There is a new nodular opacity in the left lung apex with subtle spiculation of the margin and central low attenuation concerning for central necrosis. The nodule measures 1.4 x 0.9 cm and was not present on the prior CT scan. No pleural effusion or pneumothorax. Multiple (at least 6) new nodular opacities are now visualized in the left lower lobe which were not  identified on the prior study. Several of the nodules are somewhat fusiform in appearance and appear to be branching. The largest individual nodule measures up to 2.3 cm. Musculoskeletal: No acute fracture or aggressive appearing lytic or blastic osseous lesion. Review of the MIP images confirms the above findings. CTA ABDOMEN AND PELVIS FINDINGS VASCULAR Aorta: Multifocal penetrating aortic ulcers as previously noted. The maximal diameter of the aorta at a site of penetrating aortic ulceration just proximal to the origin the SMA measures 3.3 cm, slightly enlarged compared to 3.1 cm previously. Surgical changes of prior aorto bi-iliac bypass graft again noted. The anastomoses are patent without evidence of complication. Celiac: Patent without  evidence of aneurysm, dissection, vasculitis or significant stenosis. SMA: Patent without evidence of aneurysm, dissection, vasculitis or significant stenosis. Renals: Solitary renal arteries bilaterally. 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 The right renal artery remains normal in appearance. IMA: Patent without evidence of aneurysm, dissection, vasculitis or significant stenosis. Inflow: Slight interval progression of aneurysmal dilation of the right internal iliac artery now measuring up to 2.3 cm compared to 2.1 cm previously. 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 Ectatic bilateral common femoral arteries remain stable. Veins: No focal venous abnormality. Review of the MIP images confirms the above findings. NON-VASCULAR Hepatobiliary: Normal hepatic contour and morphology. No discrete hepatic lesion. Innumerable stones layer within the gallbladder consistent with cholelithiasis. No secondary findings to suggest acute cholecystitis. Pancreas: Unremarkable. No pancreatic ductal dilatation or surrounding inflammatory changes. Spleen: Normal in size without focal abnormality. Adrenals/Urinary Tract: Normal adrenal glands. Bilateral circumscribed water attenuation renal lesions consistent with simple cysts. No evidence of hydronephrosis or enhancing renal mass. 3-4 mm stone in the left lower pole cortex. Numerous bladder stones. Diffusely thick walled bladder with multiple small cellules. Findings suggest prior bladder outlet obstruction. Stomach/Bowel: Colonic diverticular disease without CT evidence of active inflammation. Normal appendix in the right lower quadrant. Evidence of prior small bowel resection in the right lower quadrant. The enteroenteric anastomosis appears patent and unremarkable. No evidence of focal bowel wall thickening or obstruction. Lymphatic: No suspicious lymphadenopathy. Reproductive: The prostate  gland is normal in size. Other: No abdominal wall hernia or abnormality. No abdominopelvic ascites. Musculoskeletal: No acute fracture or aggressive appearing lytic or blastic osseous lesion. Multilevel degenerative disc disease most severe at L4-L5 and L5-S1. Review of the MIP images confirms the above findings. IMPRESSION: CTA CHEST 1. Similar to slightly increased focal fusiform aneurysmal dilation of the distal aortic arch with a maximal diameter of 4.0 cm today compared to 3.9 cm previously. 2. Interval development of a multiple suspicious pulmonary nodules in the left upper and lower lobes. Several of the nodules demonstrate a spiculated contour although this may be due in part to the underlying emphysematous changes. Given the multiplicity, an infectious/inflammatory process is possible. However, multifocal primary bronchogenic carcinoma versus metastatic disease is difficult to exclude. Recommend short interval follow up with repeat chest CT in 4-6 weeks following an appropriate course of antibiotic therapy. If the nodules persist or enlarge, then PET-CT would be warranted. 3. 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 4. Coronary artery calcifications. 5. Stable right-sided calcified pleural plaques. CTA ABD/PELVIS 1. 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 2. 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 3. 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 4. Stable ectasia of the bilateral common femoral arteries. 5. Additional ancillary findings as above without significant interval change. Aortic Atherosclerosis (ICD10-I70.0) and  Emphysema (ICD10-J43.9); Aortic aneurysm NOS (ICD10-I71.9). These results of CTA CHEST IMPRESSION #2 above were called by telephone at the time of interpretation on 03/23/2019 at 3:36 pm to Dr. Olga Millers , who verbally acknowledged these results. Signed, Sterling Big, MD, RPVI [facility] Radiology Electronically Signed   By: Malachy Moan M.D.   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  There are data to suggest that pacing will reduce the risk of recurrent events; given the patient's history of abrupt onset offset syncope without residual, would favor pacing as opposed to implantable loop recorder.  Have reviewed this with Dr. Marsa Aris who is in agreement.  Will reach out to Dr. Pamelia Hoit regarding thrombocytopenia and management peri-procedurally.    Have spoken with Dr Pamelia Hoit and he will plan platelet transfusion the morning of the procedure  The benefits and risks were reviewed including but not limited to death,  perforation, infection, lead dislodgement and device malfunction.  The patient understands agrees and is willing to proceed.     Sherryl Manges

## 2019-04-18 NOTE — H&P (View-Only) (Signed)
° ° °ELECTROPHYSIOLOGY CONSULT NOTE  °Patient ID: [name], MRN: [id number], DOB/AGE: 08/20/1940 78 y.o. °Admit date: (Not on file) °Date of Consult: 04/19/2019 ° °Primary Physician: Crawford, Elizabeth A, MD °Primary Cardiologist: BC  °[name] is a 78 y.o. male who is being seen today for the evaluation of syncope at the request of BC.  ° °Chief Complaint: syncope  ° ° °HPI °[name] is a 78 y.o. male referred for syncope in setting of known CAD with prior DES but normal LV function and LBBB  And 1AVB ° °He was standing outside third round to walk inside and abruptly lost consciousness.  According to him his wife thinks he was out for less than 1 minute.  There was no warning and no residual orthostatic intolerance  ° °No prior syncope.  However, he has frequent lightheadedness associated with turning his head or looking up. ° °No palpitations. ° °Occasional chest discomfort not associated with exertion. 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  ° ablated  °• Anxiety   °• Aortic aneurysm (HCC) 09/02/2017  °• BENIGN PROSTATIC HYPERTROPHY 11/23/2009  °• Cardiomyopathy (HCC) 08/28/2016  °• Chronic systolic CHF (congestive heart failure) (HCC) 09/02/2017  °• COPD (chronic obstructive pulmonary disease) (HCC)   °• CORONARY ARTERY DISEASE 11/23/2009  °• DECREASED HEARING, 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  °• DEGENERATIVE JOINT DISEASE 11/23/2009  °• DEPRESSION 11/23/2009  °• FATIGUE 11/23/2009  °• GAIT DISTURBANCE 12/10/2009  °• HEMOPTYSIS UNSPECIFIED 05/07/2010   °• High cholesterol   °• HYPERTENSION 07/30/2009  °• HYPOTHYROIDISM 07/30/2009  °• Ischemic cardiomyopathy 09/02/2017  °• LUMBAR RADICULOPATHY, 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  °• On home oxygen therapy   ° "2-3L; 24/7" (09/10/2016)  °• OSA on CPAP   °• PTSD (post-traumatic stress disorder) 03/10/2012  °• 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  °• RA (rheumatoid arthritis) (HCC) 06/11/2011  ° "qwhere" (09/10/2016)  °• RESPIRATORY FAILURE, CHRONIC 07/31/2009  °• Scleritis of both eyes 03/17/2014  °• 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  °• Type II diabetes mellitus (HCC)   °   ° °Surgical History:  °Past Surgical History:  °Procedure Laterality Date  °• ABDOMINAL AORTIC ANEURYSM REPAIR  07/2002  ° /notes 12/10/2010  °• ABDOMINAL EXPLORATION SURGERY  02/2004  ° w/LOA/notes 12/10/2010; small bowel obstruction repair with adhesiolysis   °• BACK SURGERY    °• CARDIAC CATHETERIZATION    ° 2 heart caths in the past.  One in 2000s showed one ulcerated plaque  Rx medically; [facility] /[facility] 09/05/2016  °• CATARACT EXTRACTION W/ INTRAOCULAR LENS  IMPLANT, BILATERAL Bilateral 2000s  °• COLECTOMY    ° hx of remote ileum resection due to bleeding  °• COLONOSCOPY WITH PROPOFOL N/A 01/22/2018  ° Procedure: COLONOSCOPY WITH PROPOFOL;  Surgeon: Gessner, Carl E, MD;  Location: [facility];  Service: Endoscopy;  Laterality: N/A;  °• CORONARY ANGIOPLASTY WITH STENT PLACEMENT  09/10/2016  °•   CORONARY STENT INTERVENTION N/A 09/10/2016  ° Procedure: Coronary Stent Intervention;  Surgeon: Christopher D McAlhany, MD;  Location: [facility];  Service: Cardiovascular;  Laterality: N/A;  Distal RCA 4.0x16 Synergy  °• ESOPHAGOGASTRODUODENOSCOPY (EGD) WITH PROPOFOL N/A 01/22/2018  ° Procedure: ESOPHAGOGASTRODUODENOSCOPY (EGD) WITH PROPOFOL;  Surgeon: Gessner, Carl E, MD;  Location: [facility];  Service: Endoscopy;  Laterality: N/A;  °• FEMORAL EMBOLOECTOMY Left 07/2000  ° with left leg ischemia; Dr. Lawson, vascular  °• GANGLION CYST EXCISION Right   ° "wrist"; Dr. Sypher  °• HOT HEMOSTASIS  N/A 01/22/2018  ° Procedure: HOT HEMOSTASIS (ARGON PLASMA COAGULATION/BICAP);  Surgeon: Gessner, Carl E, MD;  Location: [facility];  Service: Endoscopy;  Laterality: N/A;  °• LUMBAR LAMINECTOMY  1972  ° Dr. Sue  °• RIGHT/LEFT HEART CATH AND CORONARY ANGIOGRAPHY N/A 09/10/2016  ° Procedure: Right/Left Heart Cath and Coronary Angiography;  Surgeon: Christopher D McAlhany, MD;  Location: [facility];  Service: Cardiovascular;  Laterality: N/A;  °• TONSILLECTOMY    °  ° °Home Meds: °Prior to Admission medications   °Medication Sig Start Date End Date Taking? Authorizing Provider  °albuterol (PROVENTIL) (2.5 MG/3ML) 0.083% nebulizer solution 1 vial in nebulizer every 6 hours as needed °Dx 496 °Patient taking differently: Take 2.5 mg by nebulization every 6 (six) hours as needed for wheezing or shortness of breath. 1 vial in nebulizer every 6 hours as needed °Dx 496 05/31/12   Ramaswamy, Murali, MD  °ALPRAZolam (XANAX) 0.5 MG tablet Take 0.5 mg by mouth at bedtime.     [provider]  °apixaban (ELIQUIS) 5 MG TABS tablet Take 1 tablet (5 mg total) by mouth 2 (two) times daily. 01/18/19   Crenshaw, Brian S, MD  °atorvastatin (LIPITOR) 40 MG tablet Take 1 tablet (40 mg total) by mouth daily. 10/21/16   Ross, Paula V, MD  °Carboxymethylcellul-Glycerin (LUBRICATING EYE DROPS OP) Apply 1 drop to eye daily as needed (dry eyes).    [provider]  °Cholecalciferol (VITAMIN D3) 2000 units capsule Take 2,000 Units by mouth daily.  01/11/16   [provider]  °insulin glargine (LANTUS) 100 UNIT/ML injection Inject 0.4 mLs (40 Units total) into the skin at bedtime. °Patient taking differently: Inject 50 Units into the skin at bedtime.  09/05/17   Thompson, Daniel V, MD  °levothyroxine (SYNTHROID) 125 MCG tablet Take 125 mcg by mouth daily before breakfast.     [provider]  °losartan (COZAAR) 50 MG tablet Take 1 tablet (50 mg total) by mouth daily. 09/08/18   Crenshaw, Brian S, MD  °metFORMIN  (GLUCOPHAGE) 500 MG tablet Take 500 mg by mouth 2 (two) times daily with a meal.    [provider]  °Multiple Vitamins-Minerals (MEGA MULTIVITAMIN FOR MEN PO) Take 1 tablet by mouth daily.     [provider]  °Olodaterol HCl (STRIVERDI RESPIMAT) 2.5 MCG/ACT AERS Inhale 1 puff into the lungs daily. 04/08/18   Parrett, Tammy S, NP  °OXYGEN Inhale 2 L into the lungs continuous.    [provider]  °Semaglutide (OZEMPIC) 1 MG/DOSE SOPN Inject 1 mg into the skin once a week. 01/29/18   Wendling, Nicholas Paul, DO  °tamsulosin (FLOMAX) 0.4 MG CAPS capsule Take 1 capsule (0.4 mg total) by mouth at bedtime. 09/14/17   Wendling, Nicholas Paul, DO  °tiotropium (SPIRIVA HANDIHALER) 18 MCG inhalation capsule Place 1 capsule (18 mcg total) into inhaler and inhale daily. 07/16/17   Parrett, Tammy S, NP  ° ° °  °  Allergies:  °Allergies  °Allergen Reactions  °• No Known Allergies   ° ° °Social History  ° °Socioeconomic History  °• Marital status: Married  °  Spouse name: Not on file  °• Number of children: Not on file  °• Years of education: Not on file  °• Highest education level: Not on file  °Occupational History  °• Occupation: disabled veteran, [employer]  °  Employer: RETIRED  °Social Needs  °• Financial resource strain: Not on file  °• Food insecurity  °  Worry: Not on file  °  Inability: Not on file  °• Transportation needs  °  Medical: Not on file  °  Non-medical: Not on file  °Tobacco Use  °• Smoking status: Former Smoker  °  Packs/day: 2.50  °  Years: 40.00  °  Pack years: 100.00  °  Types: Cigarettes, Pipe, Cigars  °  Quit date: 07/28/1998  °  Years since quitting: 20.7  °• Smokeless tobacco: Never Used  °Substance and Sexual Activity  °• Alcohol use: No  °  Alcohol/week: 0.0 standard drinks  °• Drug use: No  °• Sexual activity: Not on file  °Lifestyle  °• Physical activity  °  Days per week: Not on file  °  Minutes per session: Not on file  °• Stress: Not on file  °Relationships  °• Social  connections  °  Talks on phone: Not on file  °  Gets together: Not on file  °  Attends religious service: Not on file  °  Active member of club or organization: Not on file  °  Attends meetings of clubs or organizations: Not on file  °  Relationship status: Not on file  °• Intimate partner violence  °  Fear of current or ex partner: Not on file  °  Emotionally abused: Not on file  °  Physically abused: Not on file  °  Forced sexual activity: Not on file  °Other Topics Concern  °• Not on file  °Social History Narrative  ° Lives in the home with his wife  ° [facility]  ° Denies asbestos exposure  °  ° °Family History  °Problem Relation Age of Onset  °• Other Mother   °     gun shot  °  ° °ROS:  Please see the history of present illness.     All other systems reviewed and negative.  ° ° °Physical Exam:  °Blood pressure 138/88, pulse 68, height 6' 2" (1.88 m), weight 213 lb 6.4 oz (96.8 kg), SpO2 95 %. °General: Well developed, well nourished male in no acute distress. °Head: Normocephalic, atraumatic, sclera non-icteric, no xanthomas, nares are without discharge. °EENT: normal °Lymph Nodes:  none °Back: without scoliosis/kyphosis , no CVA tendersness °Neck: Negative for carotid bruits. JVD not elevated. °Lungs: Clear bilaterally to auscultation without wheezes, rales, or rhonchi. Breathing is unlabored. °Heart: RRR with S1 S2. No  murmur , rubs, or gallops appreciated. °Abdomen: Soft, non-tender, non-distended with normoactive bowel sounds. No hepatomegaly. No rebound/guarding. No obvious abdominal masses. °Msk:  Strength and tone appear normal for age. °Extremities: No clubbing or cyanosis. tr edema.  Distal pedal pulses are 2+ and equal bilaterally. °Skin: Warm and Dry °Neuro: Alert and oriented X 3. CN III-XII intact Grossly normal sensory and motor function . °Psych:  Responds to questions appropriately with a normal affect. °  °  ° °Labs: °Cardiac Enzymes °No results for input(s): CKTOTAL,    CKMB, TROPONINI in the last 72 hours. °CBC °Lab Results  °Component Value Date  ° WBC 6.4 04/05/2019  ° HGB 10.8 (L) 04/05/2019  ° HCT 34.0 (L) 04/05/2019  ° MCV 88.5 04/05/2019  ° PLT 48 (L) 04/05/2019  ° °PROTIME: °No results for input(s): LABPROT, INR in the last 72 hours. °Chemistry No results for input(s): NA, K, CL, CO2, BUN, CREATININE, CALCIUM, PROT, BILITOT, ALKPHOS, ALT, AST, GLUCOSE in the last 168 hours. ° °Invalid input(s): LABALBU °Lipids °Lab Results  °Component Value Date  ° CHOL 111 02/17/2018  ° HDL 26 (A) 02/17/2018  ° LDLCALC 63 02/17/2018  ° TRIG 112 02/17/2018  ° °BNP °Pro B Natriuretic peptide (BNP)  °Date/Time Value Ref Range Status  °05/04/2011 05:17 AM 59.9 0 - 125 pg/mL Final  °03/20/2011 12:00 PM 9.0 0.0 - 100.0 pg/mL Final  °02/17/2011 12:55 AM 58.7 0 - 125 pg/mL Final  °12/17/2009 03:37 PM <30.0 0.0 - 100.0 pg/mL Final  ° °NT-Pro BNP  °Date/Time Value Ref Range Status  °10/20/2016 10:38 AM 188 0 - 486 pg/mL Final  °  Comment:  °  The following cut-points have been suggested for the °use of proBNP for the diagnostic evaluation of heart °failure (HF) in patients with acute dyspnea: °Modality                     Age           Optimal Cut °                           (years)            Point °------------------------------------------------------ °Diagnosis (rule in HF)        <50            450 pg/mL °                          50 - 75            900 pg/mL °                              >75           1800 pg/mL °Exclusion (rule out HF)  Age independent     300 pg/mL °  ° °Thyroid Function Tests: °No results for input(s): TSH, T4TOTAL, T3FREE, THYROIDAB in the last 72 hours. ° °Invalid input(s): FREET3 °  ° °Miscellaneous °No results found for: DDIMER ° °Radiology/Studies:  °Ct Angio Chest Aorta W &/or Wo Contrast ° °Result Date: 03/23/2019 °CLINICAL DATA:  78-year-old male with a history of thoracic and right iliac artery aneurysms. EXAM: CT ANGIOGRAPHY CHEST, ABDOMEN AND PELVIS TECHNIQUE:  Multidetector CT imaging through the chest, abdomen and pelvis was performed using the standard protocol during bolus administration of intravenous contrast. Multiplanar reconstructed images and MIPs were obtained and reviewed to evaluate the vascular anatomy. CONTRAST:  100mL OMNIPAQUE IOHEXOL 350 MG/ML SOLN COMPARISON:  Prior CTA chest abdomen and pelvis 01/22/2017 FINDINGS: CTA CHEST FINDINGS Cardiovascular: Conventional 3 vessel arch anatomy. The aortic root is normal in caliber. There is some thickening and calcification of the aortic valve. The ascending thoracic aorta is normal in caliber. There is focal fusiform aneurysmal dilation of the distal arch at the origin of the left subclavian artery which measures up to 4.0 cm, slightly increased compared to   3.9 cm previously. Normal caliber main pulmonary artery. Calcifications are present along the left anterior descending and right coronary arteries. The heart is normal in size. No pericardial effusion. Mediastinum/Nodes: Unremarkable CT appearance of the thyroid gland. No suspicious mediastinal or hilar adenopathy. No soft tissue mediastinal mass. The thoracic esophagus is unremarkable. Lungs/Pleura: Extensive calcified pleural plaques in the right hemithorax beginning at the lung apex and extending along the anterior and posterior pleural surface to the lung base posteriorly. Moderately severe combined paraseptal and centrilobular pulmonary emphysema. Pleuroparenchymal scarring at the left lung apex. There is a new nodular opacity in the left lung apex with subtle spiculation of the margin and central low attenuation concerning for central necrosis. The nodule measures 1.4 x 0.9 cm and was not present on the prior CT scan. No pleural effusion or pneumothorax. Multiple (at least 6) new nodular opacities are now visualized in the left lower lobe which were not identified on the prior study. Several of the nodules are somewhat fusiform in appearance and appear  to be branching. The largest individual nodule measures up to 2.3 cm. Musculoskeletal: No acute fracture or aggressive appearing lytic or blastic osseous lesion. Review of the MIP images confirms the above findings. CTA ABDOMEN AND PELVIS FINDINGS VASCULAR Aorta: Multifocal penetrating aortic ulcers as previously noted. The maximal diameter of the aorta at a site of penetrating aortic ulceration just proximal to the origin the SMA measures 3.3 cm, slightly enlarged compared to 3.1 cm previously. Surgical changes of prior aorto bi-iliac bypass graft again noted. The anastomoses are patent without evidence of complication. Celiac: Patent without evidence of aneurysm, dissection, vasculitis or significant stenosis. SMA: Patent without evidence of aneurysm, dissection, vasculitis or significant stenosis. Renals: Solitary renal arteries bilaterally. 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 The right renal artery remains normal in appearance. IMA: Patent without evidence of aneurysm, dissection, vasculitis or significant stenosis. Inflow: Slight interval progression of aneurysmal dilation of the right internal iliac artery now measuring up to 2.3 cm compared to 2.1 cm previously. 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 Ectatic bilateral common femoral arteries remain stable. Veins: No focal venous abnormality. Review of the MIP images confirms the above findings. NON-VASCULAR Hepatobiliary: Normal hepatic contour and morphology. No discrete hepatic lesion. Innumerable stones layer within the gallbladder consistent with cholelithiasis. No secondary findings to suggest acute cholecystitis. Pancreas: Unremarkable. No pancreatic ductal dilatation or surrounding inflammatory changes. Spleen: Normal in size without focal abnormality. Adrenals/Urinary Tract: Normal adrenal glands. Bilateral circumscribed water attenuation renal lesions consistent  with simple cysts. No evidence of hydronephrosis or enhancing renal mass. 3-4 mm stone in the left lower pole cortex. Numerous bladder stones. Diffusely thick walled bladder with multiple small cellules. Findings suggest prior bladder outlet obstruction. Stomach/Bowel: Colonic diverticular disease without CT evidence of active inflammation. Normal appendix in the right lower quadrant. Evidence of prior small bowel resection in the right lower quadrant. The enteroenteric anastomosis appears patent and unremarkable. No evidence of focal bowel wall thickening or obstruction. Lymphatic: No suspicious lymphadenopathy. Reproductive: The prostate gland is normal in size. Other: No abdominal wall hernia or abnormality. No abdominopelvic ascites. Musculoskeletal: No acute fracture or aggressive appearing lytic or blastic osseous lesion. Multilevel degenerative disc disease most severe at L4-L5 and L5-S1. Review of the MIP images confirms the above findings. IMPRESSION: CTA CHEST 1. Similar to slightly increased focal fusiform aneurysmal dilation of the distal aortic arch with a   maximal diameter of 4.0 cm today compared to 3.9 cm previously. 2. Interval development of a multiple suspicious pulmonary nodules in the left upper and lower lobes. Several of the nodules demonstrate a spiculated contour although this may be due in part to the underlying emphysematous changes. Given the multiplicity, an infectious/inflammatory process is possible. However, multifocal primary bronchogenic carcinoma versus metastatic disease is difficult to exclude. Recommend short interval follow up with repeat chest CT in 4-6 weeks following an appropriate course of antibiotic therapy. If the nodules persist or enlarge, then PET-CT would be warranted. 3. 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 4. Coronary artery calcifications. 5. Stable right-sided calcified pleural plaques. CTA ABD/PELVIS 1. Slight interval  progression of abdominal aortic aneurysmal dilation at the site of a penetrating aortic ulceration (just cephalad to the origin of the SMA) with a maximal diameter of 3.3 cm compared to 3.1 cm previously. 2. 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 3. 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 4. Stable ectasia of the bilateral common femoral arteries. 5. Additional ancillary findings as above without significant interval change. Aortic Atherosclerosis (ICD10-I70.0) and Emphysema (ICD10-J43.9); Aortic aneurysm NOS (ICD10-I71.9). These results of CTA CHEST IMPRESSION #2 above were called by telephone at the time of interpretation on 03/23/2019 at 3:36 pm to Dr. BRIAN CRENSHAW , who verbally acknowledged these results. Signed, Heath K. McCullough, MD, RPVI [facility] Electronically Signed   By: Heath  McCullough M.D.   On: 03/23/2019 15:37  ° °Ct Angio Abd/pel W/ And/or W/o ° °Result Date: 03/23/2019 °CLINICAL DATA:  78-year-old male with a history of thoracic and right iliac artery aneurysms. EXAM: CT ANGIOGRAPHY CHEST, ABDOMEN AND PELVIS TECHNIQUE: Multidetector CT imaging through the chest, abdomen and pelvis was performed using the standard protocol during bolus administration of intravenous contrast. Multiplanar reconstructed images and MIPs were obtained and reviewed to evaluate the vascular anatomy. CONTRAST:  100mL OMNIPAQUE IOHEXOL 350 MG/ML SOLN COMPARISON:  Prior CTA chest abdomen and pelvis 01/22/2017 FINDINGS: CTA CHEST FINDINGS Cardiovascular: Conventional 3 vessel arch anatomy. The aortic root is normal in caliber. There is some thickening and calcification of the aortic valve. The ascending thoracic aorta is normal in caliber. There is focal fusiform aneurysmal dilation of the  distal arch at the origin of the left subclavian artery which measures up to 4.0 cm, slightly increased compared to 3.9 cm previously. Normal caliber main pulmonary artery. Calcifications are present along the left anterior descending and right coronary arteries. The heart is normal in size. No pericardial effusion. Mediastinum/Nodes: Unremarkable CT appearance of the thyroid gland. No suspicious mediastinal or hilar adenopathy. No soft tissue mediastinal mass. The thoracic esophagus is unremarkable. Lungs/Pleura: Extensive calcified pleural plaques in the right hemithorax beginning at the lung apex and extending along the anterior and posterior pleural surface to the lung base posteriorly. Moderately severe combined paraseptal and centrilobular pulmonary emphysema. Pleuroparenchymal scarring at the left lung apex. There is a new nodular opacity in the left lung apex with subtle spiculation of the margin and central low attenuation concerning for central necrosis. The nodule measures 1.4 x 0.9 cm and was not present on the prior CT scan. No pleural effusion or pneumothorax. Multiple (at least 6) new nodular opacities are now visualized in the left lower lobe which were not   identified on the prior study. Several of the nodules are somewhat fusiform in appearance and appear to be branching. The largest individual nodule measures up to 2.3 cm. Musculoskeletal: No acute fracture or aggressive appearing lytic or blastic osseous lesion. Review of the MIP images confirms the above findings. CTA ABDOMEN AND PELVIS FINDINGS VASCULAR Aorta: Multifocal penetrating aortic ulcers as previously noted. The maximal diameter of the aorta at a site of penetrating aortic ulceration just proximal to the origin the SMA measures 3.3 cm, slightly enlarged compared to 3.1 cm previously. Surgical changes of prior aorto bi-iliac bypass graft again noted. The anastomoses are patent without evidence of complication. Celiac: Patent without  evidence of aneurysm, dissection, vasculitis or significant stenosis. SMA: Patent without evidence of aneurysm, dissection, vasculitis or significant stenosis. Renals: Solitary renal arteries bilaterally. 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 The right renal artery remains normal in appearance. IMA: Patent without evidence of aneurysm, dissection, vasculitis or significant stenosis. Inflow: Slight interval progression of aneurysmal dilation of the right internal iliac artery now measuring up to 2.3 cm compared to 2.1 cm previously. 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 Ectatic bilateral common femoral arteries remain stable. Veins: No focal venous abnormality. Review of the MIP images confirms the above findings. NON-VASCULAR Hepatobiliary: Normal hepatic contour and morphology. No discrete hepatic lesion. Innumerable stones layer within the gallbladder consistent with cholelithiasis. No secondary findings to suggest acute cholecystitis. Pancreas: Unremarkable. No pancreatic ductal dilatation or surrounding inflammatory changes. Spleen: Normal in size without focal abnormality. Adrenals/Urinary Tract: Normal adrenal glands. Bilateral circumscribed water attenuation renal lesions consistent with simple cysts. No evidence of hydronephrosis or enhancing renal mass. 3-4 mm stone in the left lower pole cortex. Numerous bladder stones. Diffusely thick walled bladder with multiple small cellules. Findings suggest prior bladder outlet obstruction. Stomach/Bowel: Colonic diverticular disease without CT evidence of active inflammation. Normal appendix in the right lower quadrant. Evidence of prior small bowel resection in the right lower quadrant. The enteroenteric anastomosis appears patent and unremarkable. No evidence of focal bowel wall thickening or obstruction. Lymphatic: No suspicious lymphadenopathy. Reproductive: The prostate  gland is normal in size. Other: No abdominal wall hernia or abnormality. No abdominopelvic ascites. Musculoskeletal: No acute fracture or aggressive appearing lytic or blastic osseous lesion. Multilevel degenerative disc disease most severe at L4-L5 and L5-S1. Review of the MIP images confirms the above findings. IMPRESSION: CTA CHEST 1. Similar to slightly increased focal fusiform aneurysmal dilation of the distal aortic arch with a maximal diameter of 4.0 cm today compared to 3.9 cm previously. 2. Interval development of a multiple suspicious pulmonary nodules in the left upper and lower lobes. Several of the nodules demonstrate a spiculated contour although this may be due in part to the underlying emphysematous changes. Given the multiplicity, an infectious/inflammatory process is possible. However, multifocal primary bronchogenic carcinoma versus metastatic disease is difficult to exclude. Recommend short interval follow up with repeat chest CT in 4-6 weeks following an appropriate course of antibiotic therapy. If the nodules persist or enlarge, then PET-CT would be warranted. 3. 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 4. Coronary artery calcifications. 5. Stable right-sided calcified pleural plaques. CTA ABD/PELVIS 1. 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 2. 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 3. 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 4. Stable ectasia of the bilateral common femoral arteries. 5. Additional ancillary findings as above without significant interval change. Aortic Atherosclerosis (ICD10-I70.0) and  Emphysema (ICD10-J43.9); Aortic aneurysm NOS (ICD10-I71.9). These results of CTA CHEST IMPRESSION #2 above were called by telephone at the time of interpretation on 03/23/2019 at 3:36 pm to Dr. BRIAN CRENSHAW , who verbally acknowledged these results. Signed, Heath K. McCullough, MD, RPVI [facility] Electronically Signed   By: Heath  McCullough M.D.   On: 03/23/2019 15:37  ° ° °EKG:sinus 68 °24/16/44 ° ° °8/20 intervals 25/17/44 °6/19 intervals 22/16/44 ° °Carotid massage negative  ° °Assessment and Plan:  °Syncope ° °LBBB 1AVB ° °CAD  Prior stenting ° °COPD O2 dependent  ° °Thrombocytopenia ° °Lightheadedness with head turning  ° °The patient has abrupt onset onset syncope in the context of left bundle branch block and first-degree AV block.  There are data to suggest that pacing will reduce the risk of recurrent events; given the patient's history of abrupt onset offset syncope without residual, would favor pacing as opposed to implantable loop recorder. ° °Have reviewed this with Dr. BC who is in agreement.  Will reach out to Dr. Gudena regarding thrombocytopenia and management peri-procedurally.   ° °Have spoken with Dr Gudena and he will plan platelet transfusion the morning of the procedure ° °The benefits and risks were reviewed including but not limited to death,  perforation, infection, lead dislodgement and device malfunction.  The patient understands agrees and is willing to proceed. ° ° ° ° °Bannon Giammarco ° °

## 2019-04-19 ENCOUNTER — Other Ambulatory Visit: Payer: Self-pay

## 2019-04-19 ENCOUNTER — Encounter: Payer: Self-pay | Admitting: Internal Medicine

## 2019-04-19 ENCOUNTER — Ambulatory Visit (INDEPENDENT_AMBULATORY_CARE_PROVIDER_SITE_OTHER): Payer: Medicare Other | Admitting: Internal Medicine

## 2019-04-19 VITALS — BP 138/88 | HR 68 | Ht 74.0 in | Wt 213.4 lb

## 2019-04-19 DIAGNOSIS — I480 Paroxysmal atrial fibrillation: Secondary | ICD-10-CM | POA: Diagnosis not present

## 2019-04-19 DIAGNOSIS — R55 Syncope and collapse: Secondary | ICD-10-CM | POA: Diagnosis not present

## 2019-04-19 NOTE — Patient Instructions (Addendum)
Medication Instructions:  The current medical regimen is effective;  continue present plan and medications.  If you need a refill on your cardiac medications before your next appointment, please call your pharmacy.   You will be contacted about being scheduled for a pacemaker placement.  Possibly for Monday April 25, 2019.  Follow-Up: . Follow up after your pacer placement.  Thank you for choosing [name]!!

## 2019-04-20 ENCOUNTER — Ambulatory Visit (INDEPENDENT_AMBULATORY_CARE_PROVIDER_SITE_OTHER): Payer: Medicare Other | Admitting: Cardiology

## 2019-04-20 ENCOUNTER — Telehealth: Payer: Self-pay | Admitting: *Deleted

## 2019-04-20 ENCOUNTER — Encounter: Payer: Self-pay | Admitting: Cardiology

## 2019-04-20 VITALS — BP 128/62 | HR 56 | Ht 74.0 in | Wt 211.8 lb

## 2019-04-20 DIAGNOSIS — I712 Thoracic aortic aneurysm, without rupture, unspecified: Secondary | ICD-10-CM

## 2019-04-20 DIAGNOSIS — I2510 Atherosclerotic heart disease of native coronary artery without angina pectoris: Secondary | ICD-10-CM

## 2019-04-20 DIAGNOSIS — R55 Syncope and collapse: Secondary | ICD-10-CM

## 2019-04-20 DIAGNOSIS — I480 Paroxysmal atrial fibrillation: Secondary | ICD-10-CM

## 2019-04-20 DIAGNOSIS — I10 Essential (primary) hypertension: Secondary | ICD-10-CM | POA: Diagnosis not present

## 2019-04-20 NOTE — Telephone Encounter (Signed)
Received call from pt stating he will be having a pace maker placed on Monday 04/25/2019.  Pt states that his cardiologist Dr. Caryl Comes spoke with Dr. Lindi Adie regarding pt receiving plt transfusion prior to pacemaker placement on 9/28.  RN will review with MD and will schedule pt if needed.

## 2019-04-20 NOTE — Patient Instructions (Signed)
Medication Instructions:  NO CHANGE If you need a refill on your cardiac medications before your next appointment, please call your pharmacy.   Lab work: If you have labs (blood work) drawn today and your tests are completely normal, you will receive your results only by: . MyChart Message (if you have MyChart) OR . A paper copy in the mail If you have any lab test that is abnormal or we need to change your treatment, we will call you to review the results.  Follow-Up: At [facility], you and your health needs are our priority.  As part of our continuing mission to provide you with exceptional heart care, we have created designated Provider Care Teams.  These Care Teams include your primary Cardiologist (physician) and Advanced Practice Providers (APPs -  Physician Assistants and Nurse Practitioners) who all work together to provide you with the care you need, when you need it. You will need a follow up appointment in 6 months.  Please call our office 2 months in advance to schedule this appointment.  You may see BRIAN CRENSHAW MD or one of the following Advanced Practice Providers on your designated Care Team:   Luke Kilroy, PA-C Krista Kroeger, PA-C . Callie Goodrich, PA-C     

## 2019-04-21 ENCOUNTER — Other Ambulatory Visit: Payer: Self-pay | Admitting: *Deleted

## 2019-04-21 DIAGNOSIS — D696 Thrombocytopenia, unspecified: Secondary | ICD-10-CM

## 2019-04-21 NOTE — Progress Notes (Signed)
Per Dr. Lindi Adie pt to receive 2 units of platelets prior to cardiac surgery on 04/25/2019.  Pt scheduled and notified of date and time of infusion apt.

## 2019-04-22 ENCOUNTER — Inpatient Hospital Stay: Payer: Medicare Other

## 2019-04-22 ENCOUNTER — Other Ambulatory Visit (HOSPITAL_COMMUNITY)
Admission: RE | Admit: 2019-04-22 | Discharge: 2019-04-22 | Disposition: A | Discharge status: Home / Self Care | Payer: Medicare Other | Source: Ambulatory Visit | Attending: Internal Medicine | Admitting: Internal Medicine

## 2019-04-22 ENCOUNTER — Other Ambulatory Visit: Payer: Self-pay | Admitting: *Deleted

## 2019-04-22 ENCOUNTER — Other Ambulatory Visit: Payer: Self-pay

## 2019-04-22 DIAGNOSIS — Z20828 Contact with and (suspected) exposure to other viral communicable diseases: Secondary | ICD-10-CM | POA: Diagnosis not present

## 2019-04-22 DIAGNOSIS — Z01812 Encounter for preprocedural laboratory examination: Secondary | ICD-10-CM | POA: Insufficient documentation

## 2019-04-22 DIAGNOSIS — D696 Thrombocytopenia, unspecified: Secondary | ICD-10-CM

## 2019-04-22 LAB — CBC WITH DIFFERENTIAL (CANCER CENTER ONLY)
Abs Immature Granulocytes: 0.01 10*3/uL (ref 0.00–0.07)
Basophils Absolute: 0 10*3/uL (ref 0.0–0.1)
Basophils Relative: 1 %
Eosinophils Absolute: 0.5 10*3/uL (ref 0.0–0.5)
Eosinophils Relative: 10 %
HCT: 33.4 % — ABNORMAL LOW (ref 39.0–52.0)
Hemoglobin: 10.8 g/dL — ABNORMAL LOW (ref 13.0–17.0)
Immature Granulocytes: 0 %
Lymphocytes Relative: 25 %
Lymphs Abs: 1.1 10*3/uL (ref 0.7–4.0)
MCH: 28.4 pg (ref 26.0–34.0)
MCHC: 32.3 g/dL (ref 30.0–36.0)
MCV: 87.9 fL (ref 80.0–100.0)
Monocytes Absolute: 0.5 10*3/uL (ref 0.1–1.0)
Monocytes Relative: 12 %
Neutro Abs: 2.2 10*3/uL (ref 1.7–7.7)
Neutrophils Relative %: 52 %
Platelet Count: 46 10*3/uL — ABNORMAL LOW (ref 150–400)
RBC: 3.8 MIL/uL — ABNORMAL LOW (ref 4.22–5.81)
RDW: 14.5 % (ref 11.5–15.5)
WBC Count: 4.3 10*3/uL (ref 4.0–10.5)
nRBC: 0 % (ref 0.0–0.2)

## 2019-04-22 LAB — ABO/RH: ABO/RH(D): O POS

## 2019-04-22 LAB — TYPE AND SCREEN
ABO/RH(D): O POS
Antibody Screen: NEGATIVE

## 2019-04-23 LAB — NOVEL CORONAVIRUS, NAA (HOSP ORDER, SEND-OUT TO REF LAB; TAT 18-24 HRS): SARS-CoV-2, NAA: NOT DETECTED

## 2019-04-25 ENCOUNTER — Other Ambulatory Visit: Payer: Self-pay

## 2019-04-25 ENCOUNTER — Encounter (HOSPITAL_COMMUNITY)
Admission: RE | Disposition: A | Discharge status: Home / Self Care | Payer: Medicare Other | Source: Home / Self Care | Attending: Internal Medicine

## 2019-04-25 ENCOUNTER — Encounter: Payer: Self-pay | Admitting: Internal Medicine

## 2019-04-25 ENCOUNTER — Inpatient Hospital Stay: Payer: Medicare Other

## 2019-04-25 ENCOUNTER — Ambulatory Visit (HOSPITAL_COMMUNITY)
Admission: RE | Admit: 2019-04-25 | Discharge: 2019-04-25 | Disposition: A | Discharge status: Home / Self Care | Payer: Medicare Other | Attending: Internal Medicine | Admitting: Internal Medicine

## 2019-04-25 ENCOUNTER — Encounter: Payer: Self-pay | Admitting: *Deleted

## 2019-04-25 DIAGNOSIS — I428 Other cardiomyopathies: Secondary | ICD-10-CM | POA: Diagnosis not present

## 2019-04-25 DIAGNOSIS — I110 Hypertensive heart disease with heart failure: Secondary | ICD-10-CM | POA: Insufficient documentation

## 2019-04-25 DIAGNOSIS — J449 Chronic obstructive pulmonary disease, unspecified: Secondary | ICD-10-CM | POA: Insufficient documentation

## 2019-04-25 DIAGNOSIS — I2510 Atherosclerotic heart disease of native coronary artery without angina pectoris: Secondary | ICD-10-CM | POA: Insufficient documentation

## 2019-04-25 DIAGNOSIS — Z794 Long term (current) use of insulin: Secondary | ICD-10-CM | POA: Diagnosis not present

## 2019-04-25 DIAGNOSIS — E7800 Pure hypercholesterolemia, unspecified: Secondary | ICD-10-CM | POA: Insufficient documentation

## 2019-04-25 DIAGNOSIS — Z79899 Other long term (current) drug therapy: Secondary | ICD-10-CM | POA: Insufficient documentation

## 2019-04-25 DIAGNOSIS — Z87891 Personal history of nicotine dependence: Secondary | ICD-10-CM | POA: Insufficient documentation

## 2019-04-25 DIAGNOSIS — D696 Thrombocytopenia, unspecified: Secondary | ICD-10-CM | POA: Diagnosis not present

## 2019-04-25 DIAGNOSIS — G4733 Obstructive sleep apnea (adult) (pediatric): Secondary | ICD-10-CM | POA: Diagnosis not present

## 2019-04-25 DIAGNOSIS — Z9981 Dependence on supplemental oxygen: Secondary | ICD-10-CM | POA: Insufficient documentation

## 2019-04-25 DIAGNOSIS — Z7901 Long term (current) use of anticoagulants: Secondary | ICD-10-CM | POA: Insufficient documentation

## 2019-04-25 DIAGNOSIS — N400 Enlarged prostate without lower urinary tract symptoms: Secondary | ICD-10-CM | POA: Diagnosis not present

## 2019-04-25 DIAGNOSIS — I255 Ischemic cardiomyopathy: Secondary | ICD-10-CM | POA: Diagnosis not present

## 2019-04-25 DIAGNOSIS — Z9049 Acquired absence of other specified parts of digestive tract: Secondary | ICD-10-CM | POA: Diagnosis not present

## 2019-04-25 DIAGNOSIS — M069 Rheumatoid arthritis, unspecified: Secondary | ICD-10-CM | POA: Diagnosis not present

## 2019-04-25 DIAGNOSIS — E039 Hypothyroidism, unspecified: Secondary | ICD-10-CM | POA: Diagnosis not present

## 2019-04-25 DIAGNOSIS — I440 Atrioventricular block, first degree: Secondary | ICD-10-CM | POA: Insufficient documentation

## 2019-04-25 DIAGNOSIS — I447 Left bundle-branch block, unspecified: Secondary | ICD-10-CM | POA: Diagnosis present

## 2019-04-25 DIAGNOSIS — I5022 Chronic systolic (congestive) heart failure: Secondary | ICD-10-CM | POA: Insufficient documentation

## 2019-04-25 DIAGNOSIS — R55 Syncope and collapse: Secondary | ICD-10-CM

## 2019-04-25 DIAGNOSIS — Z955 Presence of coronary angioplasty implant and graft: Secondary | ICD-10-CM | POA: Insufficient documentation

## 2019-04-25 DIAGNOSIS — E119 Type 2 diabetes mellitus without complications: Secondary | ICD-10-CM | POA: Diagnosis not present

## 2019-04-25 DIAGNOSIS — Z8679 Personal history of other diseases of the circulatory system: Secondary | ICD-10-CM | POA: Insufficient documentation

## 2019-04-25 DIAGNOSIS — Z79890 Hormone replacement therapy (postmenopausal): Secondary | ICD-10-CM | POA: Diagnosis not present

## 2019-04-25 HISTORY — PX: LOOP RECORDER INSERTION: EP1214

## 2019-04-25 LAB — PREPARE PLATELET PHERESIS
Unit division: 0
Unit division: 0

## 2019-04-25 LAB — BPAM PLATELET PHERESIS
Blood Product Expiration Date: 202009302359
Blood Product Expiration Date: 202009302359
Unit Type and Rh: 6200
Unit Type and Rh: 6200

## 2019-04-25 LAB — GLUCOSE, CAPILLARY: Glucose-Capillary: 181 mg/dL — ABNORMAL HIGH (ref 70–99)

## 2019-04-25 SURGERY — LOOP RECORDER INSERTION

## 2019-04-25 MED ORDER — LIDOCAINE-EPINEPHRINE 1 %-1:100000 IJ SOLN
INTRAMUSCULAR | Status: DC | PRN
  Administered 2019-04-25: 30 mL

## 2019-04-25 MED ORDER — LIDOCAINE-EPINEPHRINE 1 %-1:100000 IJ SOLN
INTRAMUSCULAR | Status: AC
  Filled 2019-04-25: qty 1

## 2019-04-25 SURGICAL SUPPLY — 2 items
MONITOR REVEAL LINQ II (Prosthesis & Implant Heart) ×3 IMPLANT
PACK LOOP INSERTION (CUSTOM PROCEDURE TRAY) ×3 IMPLANT

## 2019-04-25 NOTE — Interval H&P Note (Signed)
History and Physical Interval Note:  04/25/2019 10:30 AM  [name]  has presented today for surgery, with the diagnosis of [place]ia.  The various methods of treatment have been discussed with the patient and family. After consideration of risks, benefits and other options for treatment, the patient has consented to  Procedure(s): LOOP RECORDER INSERTION (N/A) as a surgical intervention.  The patient's history has been reviewed, patient examined, no change in status, stable for surgery.  I have reviewed the patient's chart and labs.  Questions were answered to the patient's satisfaction.     Insurance denied pacing  Will implant loop recorder  Pt agreeable and understands   Virl Axe

## 2019-04-25 NOTE — Progress Notes (Signed)
Per Dr. Lindi Adie, pt is not undergoing pacemaker placement today.  Pt does not need to receive 2 units of platelets.  Apt canceled and pt aware.

## 2019-04-25 NOTE — Progress Notes (Unsigned)
Spoke iwthDr Mickle Mallory from peer review at [facility] Despite abrupt onset offset syncope with LBBB they have disapproved pacing at this time I think this is not just a poor decision , I think it is a wrong decision.   I have called the patient and left VM that we would not be able to proceed today with pacing but we would proceed with loop recorder

## 2019-04-25 NOTE — Discharge Instructions (Signed)
Loop recorder instructions given to pt verbally and in writing. Pt verbalizes unserstanding

## 2019-04-26 ENCOUNTER — Ambulatory Visit: Payer: Medicare Other | Admitting: Adult Health

## 2019-04-26 ENCOUNTER — Encounter (HOSPITAL_COMMUNITY): Payer: Self-pay | Admitting: Internal Medicine

## 2019-04-26 ENCOUNTER — Ambulatory Visit (INDEPENDENT_AMBULATORY_CARE_PROVIDER_SITE_OTHER): Payer: Medicare Other

## 2019-04-26 DIAGNOSIS — R918 Other nonspecific abnormal finding of lung field: Secondary | ICD-10-CM | POA: Diagnosis not present

## 2019-04-26 DIAGNOSIS — G4733 Obstructive sleep apnea (adult) (pediatric): Secondary | ICD-10-CM

## 2019-04-26 DIAGNOSIS — J449 Chronic obstructive pulmonary disease, unspecified: Secondary | ICD-10-CM | POA: Diagnosis not present

## 2019-04-26 DIAGNOSIS — R911 Solitary pulmonary nodule: Secondary | ICD-10-CM

## 2019-04-26 DIAGNOSIS — I5022 Chronic systolic (congestive) heart failure: Secondary | ICD-10-CM

## 2019-04-26 DIAGNOSIS — Z9989 Dependence on other enabling machines and devices: Secondary | ICD-10-CM

## 2019-04-26 DIAGNOSIS — Z23 Encounter for immunization: Secondary | ICD-10-CM | POA: Diagnosis not present

## 2019-04-26 DIAGNOSIS — J9611 Chronic respiratory failure with hypoxia: Secondary | ICD-10-CM | POA: Diagnosis not present

## 2019-04-26 NOTE — Assessment & Plan Note (Signed)
Continue on oxygen 2 L O2 saturation goal is greater than 88 to 90%

## 2019-04-26 NOTE — Assessment & Plan Note (Signed)
Abnormal CT chest with lung nodularity noted.  Patient with acute symptoms recently.  We will follow-up CT closely in 2 months.

## 2019-04-26 NOTE — Assessment & Plan Note (Signed)
Severe COPD compensated on present regimen.  Get flu shot today Plan  Patient Instructions  Continue on Spiriva and Striverdi daily , rinse after use .  Continue on Oxygen 2l/m Continue on CPAP At bedtime  With oxygen .  Wear each night for at least 6 hr each night .  Plan for CT chest in 2  months as planned  Flu shot today .  Follow up in 2-3 months with [place] NP or Dr. Elsworth Soho Please contact office for sooner follow up if symptoms do not improve or worsen or seek emergency care

## 2019-04-26 NOTE — Assessment & Plan Note (Signed)
Continue on nocturnal CPAP.  Get CPAP download on return for new CPAP machine.  Plan  Patient Instructions  Continue on Spiriva and Striverdi daily , rinse after use .  Continue on Oxygen 2l/m Continue on CPAP At bedtime  With oxygen .  Wear each night for at least 6 hr each night .  Plan for CT chest in 2  months as planned  Flu shot today .  Follow up in 2-3 months with Alyona Romack NP or Dr. Elsworth Soho Please contact office for sooner follow up if symptoms do not improve or worsen or seek emergency care

## 2019-04-26 NOTE — Assessment & Plan Note (Signed)
Appears compensated continue follow-up with cardiology 

## 2019-04-26 NOTE — Progress Notes (Signed)
 @Patient  ID: [name], male    DOB: [birth year], 78 y.o.   MRN: [id number]  Chief Complaint  Patient presents with  . Follow-up    COPD    Referring provider: Myrlene Brokerrawford, Elizabeth A, *  HPI: 78 year old male former smoker followed for severe COPD, chronic respiratory failure on oxygen-2 L, obstructive sleep apnea and lung nodules Medical history significant for rheumatoid arthritis (no maintenance medications), diabetes, congestive heart failure-diagnosed 2018 with echo showing EF 35 to 40%, coronary disease February 2018 status post RCA stent, chronic kidney disease, A. fib (previously on amiodarone stopped August 2019) and on Eliquis. Anemia and thrombocytopenia followed by hematology Gets medicines from the [facility] (was in the [employer])    TEST/EVENTS :  March 2017 some 23 days ago underwent autologous stem cell [facility].  PFT 10/2015 FEV1 was 36%, ratio 46, FVC 58%, DLCO 26% Echo 08/2016 >EF 35-40%, s/p stent , Echo 08/2017 40-45%, gr 2 DD CXR 11/2016 LUL PNA ,  CT chest 12/2016 -masslike consolidation in LUL  CT chest 03/12/17 >stable masslike consolidation LUL 3.9cm , LUL 7 mm nodule  PET scan 03/20/17 >+metabolic act in LUL consolidation CT chest February 2019>nodular left-sided airspace opacities have improved from prior study. CT angio chest January 25, 2018>advanced emphysema, scarring of the left upper lobe, nodules of the left lower lobe are unchanged. Scattered scarring in the right lung is unchanged. CT chest September 2019>several pulmonary nodules scattered throughout the left lung stable since February 2019 decreased from 05/2017 most compatible with stable postinflammatory nodularity with recommendations for follow-up CT chest in 12 months.,Thick parenchymal band in the anterior left upper lobe slightly decreased in thickness, stable right fibrothorax with extensive basilar pleural-parenchymal scarring in  the right lung, severe emphysema  08/2017 admitted with Sepsis [name]/PNA  12/2017 Admitted with GIB , anemia 02/2018 Hematology following chronic anemia  04/26/2019 Follow up : COPD, oxygen dependent respiratory failure, lung nodules, obstructive sleep apnea Patient presents for a one-month follow-up.  Patient was seen last visit with a COPD exacerbation.  She was given Augmentin for 1 week.  Patient says he is feeling better. Patient had a syncopal episode in August 2020.  He is undergoing an evaluation for this.  He had a loop recorder implanted yesterday.  He says he has had no further episodes. Patient has a known thoracic and iliac artery aneurysms.  A follow-up CT angiography of the chest abdomen and pelvis showed incidental nodularity in the left apex measuring 1.4 x 0.9.  Along with some multiple new nodular opacities.  Patient has known lung nodularity that is been followed serially on CT chest with waxing and waning nodules on previous scans.  With patient's increased cough and congestion.  He was given a course of antibiotics and a follow-up CT chest planned for 3671-month. Patient denies any weight loss or hemoptysis. Chest x-ray today shows chronic changes without new nodularity noted   Patient has underlying obstructive sleep apnea is on nocturnal CPAP.  Says he is doing very well.  He always wears his CPAP.  He recently got a new machine he just got it over the last few days.  Says it is working well.  He says he feels rested with no significant daytime sleepiness and feels that he benefits from his CPAP.  Patient is on chronic oxygen at 2 L.  Says he is doing well with this.  Patient is a primary caregiver for his wife who  has severe COPD and is on hospice.  Says that she is stable currently.  He would like to get his flu shot today No Known Allergies  Immunization History  Administered Date(s) Administered  . Fluad Quad(high Dose 65+) 04/26/2019  . Influenza Split 04/14/2011,  04/27/2013  . Influenza Whole 05/28/2009, 04/27/2010, 03/31/2012  . Influenza, High Dose Seasonal PF 03/21/2015, 05/16/2017, 04/08/2018  . Influenza,inj,Quad PF,6+ Mos 03/17/2014, 04/04/2016  . Influenza-Unspecified 05/26/2011, 05/07/2012  . Pneumococcal Conjugate-13 09/20/2013  . Pneumococcal Polysaccharide-23 05/28/2008, 04/25/2010  . Pneumococcal-Unspecified 04/27/2013  . Td 11/23/2009  . Zoster 04/28/2009    Past Medical History:  Diagnosis Date  . 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 Anxiety   . Aortic aneurysm (HCC) 09/02/2017  . BENIGN PROSTATIC HYPERTROPHY 11/23/2009  . Cardiomyopathy (HCC) 08/28/2016  . Chronic systolic CHF (congestive heart failure) (HCC) 09/02/2017  . COPD (chronic obstructive pulmonary disease) (HCC)   . CORONARY ARTERY DISEASE 11/23/2009  . 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 DEGENERATIVE JOINT DISEASE 11/23/2009  . DEPRESSION 11/23/2009  . FATIGUE 11/23/2009  . GAIT DISTURBANCE 12/10/2009  . HEMOPTYSIS UNSPECIFIED 05/07/2010  . High cholesterol   . HYPERTENSION 07/30/2009  . HYPOTHYROIDISM 07/30/2009  . Ischemic cardiomyopathy 09/02/2017  . 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 On home oxygen therapy    "2-3L; 24/7" (09/10/2016)  . OSA on CPAP   . PTSD (post-traumatic stress disorder) 03/10/2012  . 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 RA (rheumatoid arthritis) (HCC) 06/11/2011   "qwhere" (09/10/2016)  . RESPIRATORY FAILURE, CHRONIC 07/31/2009  . Scleritis of both eyes 03/17/2014  . Thrombocytopenia (HCC)   . 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 Type II diabetes mellitus (HCC)     Tobacco History: Social History   Tobacco Use  Smoking Status Former Smoker  . Packs/day: 2.50  . Years: 40.00  . Pack years: 100.00  . Types: Cigarettes, Pipe, Cigars  . Quit date: 07/28/1998  . Years since quitting: 20.7  Smokeless Tobacco Never Used   Counseling given: Not Answered   Outpatient Medications Prior to Visit  Medication Sig Dispense Refill  . albuterol  (PROVENTIL) (2.5 MG/3ML) 0.083% nebulizer solution 1 vial in nebulizer every 6 hours as needed Dx 496 (Patient taking differently: Take 2.5 mg by nebulization every 6 (six) hours as needed for wheezing or shortness of breath. 1 vial in nebulizer every 6 hours as needed Dx 496) 120 mL 6  . ALPRAZolam (XANAX) 0.5 MG tablet Take 0.5 mg by mouth at bedtime.     [name] apixaban (ELIQUIS) 5 MG TABS tablet Take 1 tablet (5 mg total) by mouth 2 (two) times daily. 180 tablet 2  . atorvastatin (LIPITOR) 40 MG tablet Take 1 tablet (40 mg total) by mouth daily. 90 tablet 3  . Carboxymethylcellul-Glycerin (LUBRICATING EYE DROPS OP) Place 1 drop into both eyes 4 (four) times daily as needed (dry eyes).     . Cholecalciferol (VITAMIN D3) 2000 units capsule Take 2,000 Units by mouth daily.     . insulin glargine (LANTUS) 100 UNIT/ML injection Inject 50 Units into the skin at bedtime.     [name] levothyroxine (SYNTHROID) 125 MCG tablet Take 125 mcg by mouth daily before breakfast.     . losartan (COZAAR) 50 MG tablet Take 1 tablet (50 mg total) by mouth daily. 90 tablet 3  . metFORMIN (GLUCOPHAGE) 500 MG tablet Take 500 mg by mouth 2 (two) times daily with a meal.    . Multiple Vitamins-Minerals (MEGA MULTIVITAMIN FOR MEN PO)  Take 1 tablet by mouth daily.     . Olodaterol HCl (STRIVERDI RESPIMAT) 2.5 MCG/ACT AERS Inhale 1 puff into the lungs daily. (Patient taking differently: Inhale 1 puff into the lungs every evening. )    . OXYGEN Inhale 2 L into the lungs continuous.    . Semaglutide (OZEMPIC) 1 MG/DOSE SOPN Inject 1 mg into the skin every Friday.     . tamsulosin (FLOMAX) 0.4 MG CAPS capsule Take 1 capsule (0.4 mg total) by mouth at bedtime. 30 capsule 0  . tiotropium (SPIRIVA HANDIHALER) 18 MCG inhalation capsule Place 1 capsule (18 mcg total) into inhaler and inhale daily. 30 capsule 4   No facility-administered medications prior to visit.      Review of Systems:   Constitutional:   No  weight loss, night  sweats,  Fevers, chills,  +fatigue, or  lassitude.  HEENT:   No headaches,  Difficulty swallowing,  Tooth/dental problems, or  Sore throat,                No sneezing, itching, ear ache, nasal congestion, post nasal drip,   CV:  No chest pain,  Orthopnea, PND, swelling in lower extremities, anasarca, dizziness, palpitations, syncope.   GI  No heartburn, indigestion, abdominal pain, nausea, vomiting, diarrhea, change in bowel habits, loss of appetite, bloody stools.   Resp  No excess mucus, no productive cough,  No non-productive cough,  No coughing up of blood.  No change in color of mucus.  No wheezing.  No chest wall deformity  Skin: no rash or lesions.  GU: no dysuria, change in color of urine, no urgency or frequency.  No flank pain, no hematuria   MS:  No joint pain or swelling.  No decreased range of motion.  No back pain.    Physical Exam  BP 134/80 (BP Location: Left Arm, Cuff Size: Normal)   Pulse 82   Temp (!) 97.2 F (36.2 C) (Temporal)   Ht 6\' 1"  (1.854 m)   Wt 211 lb (95.7 kg)   SpO2 95%   BMI 27.84 kg/m   GEN: A/Ox3; pleasant , NAD, elderly, on oxygen  HEENT:  [place]/AT,  NOSE-clear, THROAT-clear, no lesions, no postnasal drip or exudate noted.  Class II MP airway  NECK:  Supple w/ fair ROM; no JVD; normal carotid impulses w/o bruits; no thyromegaly or nodules palpated; no lymphadenopathy.    RESP decreased breath sounds in the bases   no accessory muscle use, no dullness to percussion  CARD:  RRR, no m/r/g, tr peripheral edema, pulses intact, no cyanosis or clubbing.  GI:   Soft & nt; nml bowel sounds; no organomegaly or masses detected.   Musco: Warm bil, no deformities or joint swelling noted.   Neuro: alert, no focal deficits noted.    Skin: Warm, no lesions or rashes    Lab Results:  CBC    Component Value Date/Time   WBC 4.3 04/22/2019 1224   WBC 4.0 03/25/2019 1138   RBC 3.80 (L) 04/22/2019 1224   HGB 10.8 (L) 04/22/2019 1224   HGB 11.0  (L) 12/23/2016 1053   HCT 33.4 (L) 04/22/2019 1224   HCT 29.6 (L) 01/23/2018 0830   PLT 46 (L) 04/22/2019 1224   PLT 125 (L) 12/23/2016 1053   MCV 87.9 04/22/2019 1224   MCV 84 12/23/2016 1053   MCH 28.4 04/22/2019 1224   MCHC 32.3 04/22/2019 1224   RDW 14.5 04/22/2019 1224   RDW 15.6 (H) 12/23/2016 1053  LYMPHSABS 1.1 04/22/2019 1224   LYMPHSABS 1.2 12/23/2016 1053   MONOABS 0.5 04/22/2019 1224   EOSABS 0.5 04/22/2019 1224   EOSABS 0.3 12/23/2016 1053   BASOSABS 0.0 04/22/2019 1224   BASOSABS 0.0 12/23/2016 1053    BMET    Component Value Date/Time   NA 135 03/15/2019 1308   NA 142 09/23/2018 0955   K 4.4 03/15/2019 1308   CL 101 03/15/2019 1308   CO2 23 03/15/2019 1308   GLUCOSE 257 (H) 03/15/2019 1308   BUN 25 (H) 03/15/2019 1308   BUN 27 09/23/2018 0955   CREATININE 1.36 (H) 03/15/2019 1308   CREATININE 1.12 07/15/2018 1059   CALCIUM 9.3 03/15/2019 1308   GFRNONAA 50 (L) 03/15/2019 1308   GFRNONAA >60 07/15/2018 1059   GFRAA 58 (L) 03/15/2019 1308   GFRAA >60 07/15/2018 1059    BNP No results found for: BNP  ProBNP    Component Value Date/Time   PROBNP 188 10/20/2016 1038   PROBNP 59.9 05/04/2011 0517    Imaging: Dg Chest 2 View  Result Date: 04/26/2019 CLINICAL DATA:  History of lung nodules. EXAM: CHEST - 2 VIEW COMPARISON:  None. CT scan March 23, 2019. Chest x-ray September 02, 2017 FINDINGS: Chronic changes on the right with calcified pleural plaques and blunting of the right costophrenic angle. Changes in left upper lobe are similar in appearance to the September 02, 2017 study. Comparison to the March 23, 2019 CT is not particularly reliable. Emphysematous changes in the lungs. The cardiomediastinal silhouette is stable. IMPRESSION: The nodularity in the left lung on the recent CT scan is not definitely seen on this x-ray. However, CT imaging is much more sensitive and the lack of nodularity on this x-ray should not be considered diagnostic. Recommend CT  imaging to ensure resolution of previous findings. Other chronic changes as above. Electronically Signed   By: Gerome Sam III M.D   On: 04/26/2019 14:44      PFT Results Latest Ref Rng & Units 11/16/2015 12/30/2013  FVC-Pre L 2.96 3.01  FVC-Predicted Pre % 59 59  FVC-Post L 2.89 3.01  FVC-Predicted Post % 58 59  Pre FEV1/FVC % % 43 45  Post FEV1/FCV % % 46 49  FEV1-Pre L 1.28 1.36  FEV1-Predicted Pre % 35 36  FEV1-Post L 1.33 1.48  DLCO UNC% % 26 26  DLCO COR %Predicted % 40 39  TLC L - 6.31  TLC % Predicted % - 80  RV % Predicted % - 80    No results found for: NITRICOXIDE      Assessment & Plan:   COPD (chronic obstructive pulmonary disease) (HCC) Severe COPD compensated on present regimen.  Get flu shot today Plan  Patient Instructions  Continue on Spiriva and Striverdi daily , rinse after use .  Continue on Oxygen 2l/m Continue on CPAP At bedtime  With oxygen .  Wear each night for at least 6 hr each night .  Plan for CT chest in 2  months as planned  Flu shot today .  Follow up in 2-3 months with  NP or Dr. Vassie Loll Please contact office for sooner follow up if symptoms do not improve or worsen or seek emergency care       OSA on CPAP Continue on nocturnal CPAP.  Get CPAP download on return for new CPAP machine.  Plan  Patient Instructions  Continue on Spiriva and Striverdi daily , rinse after use .  Continue on Oxygen 2l/m Continue  on CPAP At bedtime  With oxygen .  Wear each night for at least 6 hr each night .  Plan for CT chest in 2  months as planned  Flu shot today .  Follow up in 2-3 months with  NP or Dr. Vassie Loll Please contact office for sooner follow up if symptoms do not improve or worsen or seek emergency care       Chronic respiratory failure with hypoxia (HCC) Continue on oxygen 2 L O2 saturation goal is greater than 88 to 90%  Lung nodules Abnormal CT chest with lung nodularity noted.  Patient with acute symptoms  recently.  We will follow-up CT closely in 2 months.   Chronic systolic CHF (congestive heart failure) (HCC) Appears compensated continue follow-up with cardiology     Rubye Oaks, NP 04/26/2019

## 2019-04-26 NOTE — Patient Instructions (Addendum)
Continue on Spiriva and Striverdi daily , rinse after use .  Continue on Oxygen 2l/m Continue on CPAP At bedtime  With oxygen .  Wear each night for at least 6 hr each night .  Plan for CT chest in 2  months as planned  Flu shot today .  Follow up in 2-3 months with Kasmira Cacioppo NP or Dr. Elsworth Soho Please contact office for sooner follow up if symptoms do not improve or worsen or seek emergency care

## 2019-05-05 ENCOUNTER — Ambulatory Visit (HOSPITAL_BASED_OUTPATIENT_CLINIC_OR_DEPARTMENT_OTHER)
Admission: RE | Admit: 2019-05-05 | Discharge: 2019-05-05 | Disposition: A | Discharge status: Home / Self Care | Payer: Medicare Other | Source: Ambulatory Visit | Attending: Adult Health | Admitting: Adult Health

## 2019-05-05 ENCOUNTER — Other Ambulatory Visit: Payer: Self-pay

## 2019-05-05 DIAGNOSIS — R918 Other nonspecific abnormal finding of lung field: Secondary | ICD-10-CM | POA: Diagnosis not present

## 2019-05-06 ENCOUNTER — Other Ambulatory Visit: Payer: Self-pay | Admitting: Internal Medicine

## 2019-05-06 DIAGNOSIS — R911 Solitary pulmonary nodule: Secondary | ICD-10-CM

## 2019-05-10 ENCOUNTER — Ambulatory Visit (INDEPENDENT_AMBULATORY_CARE_PROVIDER_SITE_OTHER): Payer: Medicare Other | Admitting: Student

## 2019-05-10 ENCOUNTER — Other Ambulatory Visit: Payer: Self-pay

## 2019-05-10 DIAGNOSIS — R55 Syncope and collapse: Secondary | ICD-10-CM

## 2019-05-10 LAB — CUP PACEART INCLINIC DEVICE CHECK
Date Time Interrogation Session: 20201013145229
Implantable Pulse Generator Implant Date: 20200928

## 2019-05-10 NOTE — Progress Notes (Signed)
ILR wound check in clinic. Steri strips removed. Wound well healed. Home monitor transmitting nightly. 1 symptom and 1 pause episode associate with implant. Questions answered.

## 2019-05-23 ENCOUNTER — Telehealth: Payer: Self-pay | Admitting: Student

## 2019-05-23 NOTE — Telephone Encounter (Signed)
LMOM to CB.   Pt with ILR implanted for syncope with 5 second pause on 05/20/19 ~ 1042 am.   Called to assess symptoms.

## 2019-05-26 NOTE — Telephone Encounter (Signed)
Patient returned call. He denies any recent syncopal episodes, doesn't recall any specific symptoms at the time of pause detection on 05/20/19 at 10:42. Reports he was likely awake, "but not doing anything" at the time. He has dizzy spells quite often, almost daily. Has not used his symptom activator at all. Encouraged pt to mark symptom episodes for our review. Explained ILR will store up to two 34min duration episodes each day, monitor should transmit episodes automatically as pt has a LINQ II. Pt verbalizes understanding of instructions, agrees to start marking symptom episodes. No further questions at this time.

## 2019-05-27 ENCOUNTER — Ambulatory Visit (INDEPENDENT_AMBULATORY_CARE_PROVIDER_SITE_OTHER): Payer: Medicare Other | Admitting: *Deleted

## 2019-05-27 DIAGNOSIS — I480 Paroxysmal atrial fibrillation: Secondary | ICD-10-CM | POA: Diagnosis not present

## 2019-05-27 DIAGNOSIS — I5022 Chronic systolic (congestive) heart failure: Secondary | ICD-10-CM

## 2019-05-29 LAB — CUP PACEART REMOTE DEVICE CHECK
Date Time Interrogation Session: 20201101174144
Implantable Pulse Generator Implant Date: 20200928

## 2019-05-30 NOTE — Telephone Encounter (Signed)
[name] and [name] This gentleman was denied approval for pacing because we lacked documentation of asystole I would contact him, and despite the paucity of assoc symptoms--would move toward pacing  This could be before I get back and if he needs I can reach out to him    Thanks SK

## 2019-06-01 NOTE — Telephone Encounter (Signed)
Spoke with patient. He is agreeable to moving forward with pacing. Advised I will make Dr. Caryl Comes aware so that procedure can be arranged. Pt is agreeable to plan and is aware our office will contact him to schedule. No further questions at this time.

## 2019-06-02 NOTE — Telephone Encounter (Signed)
[name] Thanks SK   

## 2019-06-08 NOTE — Progress Notes (Signed)
Carelink Summary Report / Loop Recorder 

## 2019-06-10 NOTE — Telephone Encounter (Signed)
Pt scheduled to see Dr. Caryl Comes to discuss PPM implant. He is concerned with insurance issues.

## 2019-06-13 ENCOUNTER — Other Ambulatory Visit: Payer: Self-pay

## 2019-06-13 ENCOUNTER — Ambulatory Visit: Payer: Medicare Other | Admitting: Internal Medicine

## 2019-06-13 ENCOUNTER — Encounter: Payer: Self-pay | Admitting: Internal Medicine

## 2019-06-13 VITALS — BP 144/92 | HR 84 | Ht 73.0 in | Wt 218.0 lb

## 2019-06-13 DIAGNOSIS — I480 Paroxysmal atrial fibrillation: Secondary | ICD-10-CM

## 2019-06-13 DIAGNOSIS — Z01812 Encounter for preprocedural laboratory examination: Secondary | ICD-10-CM

## 2019-06-13 DIAGNOSIS — I469 Cardiac arrest, cause unspecified: Secondary | ICD-10-CM | POA: Diagnosis not present

## 2019-06-13 DIAGNOSIS — I2510 Atherosclerotic heart disease of native coronary artery without angina pectoris: Secondary | ICD-10-CM | POA: Diagnosis not present

## 2019-06-13 NOTE — Patient Instructions (Addendum)
Medication Instructions:  The current medical regimen is effective;  continue present plan and medications.  *If you need a refill on your cardiac medications before your next appointment, please call your pharmacy*  Lab Work: Please have blood work December 1. (at Dr Jacalyn Lefevre office). Have COVID screening after your blood work.   This will be completed at [place] [place])  Please follow the signs and stay in the right hand lane.  You will need to self quarantine after. If you have labs (blood work) drawn today and your tests are completely normal, you will receive your results only by: [name] MyChart Message (if you have MyChart) OR . A paper copy in the mail If you have any lab test that is abnormal or we need to change your treatment, we will call you to review the results.  Testing/Procedures: Your physician has recommended that you have a pacemaker inserted. A pacemaker is a small device that is placed under the skin of your chest or abdomen to help control abnormal heart rhythms. This device uses electrical pulses to prompt the heart to beat at a normal rate. Pacemakers are used to treat heart rhythms that are too slow. Wire (leads) are attached to the pacemaker that goes into the chambers of you heart. This is done in the hospital and usually requires and overnight stay. Please see the instruction sheet given to you today for more information.  Follow-Up: Follow up after your pacermaker placement as instructed.  Thank you for choosing [facility]!!

## 2019-06-13 NOTE — Progress Notes (Signed)
Patient Care Team: Myrlene Broker, MD as PCP - General (Internal Medicine) Ernesto Rutherford, MD as Consulting Physician (Ophthalmology) Kalman Shan, MD as Consulting Physician (Pulmonary Disease) System, Provider Not In as Consulting Physician Casimer Lanius, MD as Consulting Physician (Rheumatology) Oretha Milch, MD as Consulting Physician (Pulmonary Disease) Lewayne Bunting, MD as Consulting Physician (Cardiology)   HPI  [name] is a 77 y.o. 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  The plan had been to undertake pacing.  This was rejected by his insurance company.  Undertook loop recorder insertion.  This is identified a 5+*second pause.  He also has profound thrombocytopenia.  Discussions prior to his aborted pacemaker with his hematologist had been to transfuse with platelets prior to pacing.  Some interval LH   Date Cr K Hgb Plt  8/20 1.36 4.4 10.8 37>>48              DATE TEST EF   2/18 LHC   LM 30, LAD 50  RCAm-d-aneurysmal 80>>DES  2/19 Carotids  L 1-39, R no stenosis   7/19 CTA   AAA 3.7 cm, AoArch 3.7cm Iliac and femoral aneurysms noted      Records and Results Reviewed   Past Medical History:  Diagnosis Date  . 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 Anxiety   . Aortic aneurysm (HCC) 09/02/2017  . BENIGN PROSTATIC HYPERTROPHY 11/23/2009  . Cardiomyopathy (HCC) 08/28/2016  . Chronic systolic CHF (congestive heart failure) (HCC) 09/02/2017  . COPD (chronic obstructive pulmonary disease) (HCC)   . CORONARY ARTERY DISEASE 11/23/2009  . 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 DEGENERATIVE JOINT DISEASE 11/23/2009  . DEPRESSION 11/23/2009  . FATIGUE 11/23/2009  . GAIT DISTURBANCE 12/10/2009  . HEMOPTYSIS UNSPECIFIED 05/07/2010  . High cholesterol   . HYPERTENSION 07/30/2009  . HYPOTHYROIDISM 07/30/2009  . Ischemic cardiomyopathy 09/02/2017  . 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 On home oxygen therapy    "2-3L; 24/7" (09/10/2016)  . OSA on CPAP   . PTSD (post-traumatic stress disorder) 03/10/2012  . 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 RA (rheumatoid arthritis) (HCC) 06/11/2011   "qwhere" (09/10/2016)  . RESPIRATORY FAILURE, CHRONIC 07/31/2009  . Scleritis of both eyes 03/17/2014  . Thrombocytopenia (HCC)   . 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 Type II diabetes mellitus (HCC)     Past Surgical History:  Procedure Laterality Date  . 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 ABDOMINAL EXPLORATION SURGERY  02/2004   w/LOA/notes 12/10/2010; small bowel obstruction repair with adhesiolysis   . BACK SURGERY    . CARDIAC CATHETERIZATION     2 heart caths in the past.  One in 2000s showed one ulcerated plaque  Rx medically; Second at [facility] [name] 09/05/2016  . CATARACT EXTRACTION W/ INTRAOCULAR LENS  IMPLANT, BILATERAL Bilateral 2000s  . COLECTOMY     hx of remote ileum resection due to bleeding  . COLONOSCOPY WITH PROPOFOL N/A 01/22/2018   Procedure: COLONOSCOPY WITH PROPOFOL;  Surgeon: Iva Boop, MD;  Location: [facility];  Service: Endoscopy;  Laterality: N/A;  . CORONARY ANGIOPLASTY WITH STENT PLACEMENT  09/10/2016  . CORONARY STENT INTERVENTION N/A 09/10/2016   Procedure: Coronary Stent Intervention;  Surgeon: Kathleene Hazel, MD;  Location: [facility] INVASIVE CV LAB;  Service: Cardiovascular;  Laterality: N/A;  Distal RCA 4.0x16 Synergy  . ESOPHAGOGASTRODUODENOSCOPY (EGD) WITH PROPOFOL N/A  01/22/2018   Procedure: ESOPHAGOGASTRODUODENOSCOPY (EGD) WITH PROPOFOL;  Surgeon: Gatha Mayer, MD;  Location: [facility];  Service: Endoscopy;  Laterality: N/A;  . FEMORAL EMBOLOECTOMY Left 07/2000   with left leg ischemia; Dr. Kellie Simmering, vascular  . GANGLION CYST EXCISION Right    "wrist"; Dr. Daylene Katayama  . HOT HEMOSTASIS N/A 01/22/2018   Procedure: HOT HEMOSTASIS (ARGON PLASMA COAGULATION/BICAP);  Surgeon: Gatha Mayer, MD;  Location: [facility];   Service: Endoscopy;  Laterality: N/A;  . LOOP RECORDER INSERTION N/A 04/25/2019   Procedure: LOOP RECORDER INSERTION;  Surgeon: Deboraha Sprang, MD;  Location: [place] CV LAB;  Service: Cardiovascular;  Laterality: N/A;  . LUMBAR LAMINECTOMY  1972   Dr. Collie Siad  . RIGHT/LEFT HEART CATH AND CORONARY ANGIOGRAPHY N/A 09/10/2016   Procedure: Right/Left Heart Cath and Coronary Angiography;  Surgeon: Burnell Blanks, MD;  Location: [place] CV LAB;  Service: Cardiovascular;  Laterality: N/A;  . TONSILLECTOMY      Current Meds  Medication Sig  . albuterol (PROVENTIL) (2.5 MG/3ML) 0.083% nebulizer solution 1 vial in nebulizer every 6 hours as needed Dx 496  . ALPRAZolam (XANAX) 0.5 MG tablet Take 0.5 mg by mouth at bedtime.   [name] apixaban (ELIQUIS) 5 MG TABS tablet Take 1 tablet (5 mg total) by mouth 2 (two) times daily.  [name] atorvastatin (LIPITOR) 40 MG tablet Take 1 tablet (40 mg total) by mouth daily.  . Carboxymethylcellul-Glycerin (LUBRICATING EYE DROPS OP) Place 1 drop into both eyes 4 (four) times daily as needed (dry eyes).   . Cholecalciferol (VITAMIN D3) 2000 units capsule Take 2,000 Units by mouth daily.   . insulin glargine (LANTUS) 100 UNIT/ML injection Inject 50 Units into the skin at bedtime.   [name] levothyroxine (SYNTHROID) 125 MCG tablet Take 125 mcg by mouth daily before breakfast.   . losartan (COZAAR) 50 MG tablet Take 1 tablet (50 mg total) by mouth daily.  . metFORMIN (GLUCOPHAGE) 500 MG tablet Take 500 mg by mouth 2 (two) times daily with a meal.  . Multiple Vitamins-Minerals (MEGA MULTIVITAMIN FOR MEN PO) Take 1 tablet by mouth daily.   . Olodaterol HCl (STRIVERDI RESPIMAT) 2.5 MCG/ACT AERS Inhale 1 puff into the lungs daily.  . OXYGEN Inhale 2 L into the lungs continuous.  . Semaglutide (OZEMPIC) 1 MG/DOSE SOPN Inject 1 mg into the skin every Friday.   . tamsulosin (FLOMAX) 0.4 MG CAPS capsule Take 1 capsule (0.4 mg total) by mouth at bedtime.  [name] tiotropium (SPIRIVA  HANDIHALER) 18 MCG inhalation capsule Place 1 capsule (18 mcg total) into inhaler and inhale daily.    No Known Allergies    Review of Systems negative except from HPI and PMH  Physical Exam BP (!) 144/92   Pulse 84   Ht 6\' 1"  (1.854 m)   Wt 218 lb (98.9 kg)   SpO2 97%   BMI 28.76 kg/m  Well developed and well nourished in no acute distress wearing oxygen HENT normal E scleral and icterus clear Neck Supple JVP flat; carotids brisk and full Clear to ausculation  Regular rate and rhythm, no murmurs gallops or rub Soft with active bowel sounds No clubbing cyanosis  Edema Alert and oriented, grossly normal motor and sensory function Skin Warm and Dry     Assessment and Plan:  Syncope  LBBB 1AVB  CAD  Prior stenting  COPD O2 dependent   Thrombocytopenia  Implantable loop recorder  Asystole    The patient has syncope conduction system disease  with 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  He also now has documented asystole of greater than 5 seconds while awake.  We will proceed with pacemaker implantation. The benefits and risks were reviewed including but not limited to death,  perforation, infection, lead dislodgement and device malfunction.  The patient understands agrees and is willing to proceed.  We will need to clarify with his hematologist about preimplantation platelet transfusion  We will anticipate removing of a loop  Current medicines are reviewed at length with the patient today .  The patient does not  have concerns regarding medicines.

## 2019-06-15 ENCOUNTER — Telehealth: Payer: Self-pay | Admitting: *Deleted

## 2019-06-15 ENCOUNTER — Encounter: Payer: Self-pay | Admitting: Internal Medicine

## 2019-06-15 NOTE — Telephone Encounter (Signed)
Patient returning call in schedule his pacemaker procedure.

## 2019-06-15 NOTE — Telephone Encounter (Signed)
Spoke with pt who would rather go to [place] for PLTs then report to Short Stay at [facility] for pacer placement.  Advised I will c/b with times and instructions.

## 2019-06-15 NOTE — Telephone Encounter (Signed)
Called to speak with patient regarding scheduling his pacemaker procedure as discussed 12/4 with Dr Olin Pia.  Pt needs PLT infusion prior to his procedure. (lst PLT was 38).  Spoke with Merleen Nicely at [facility] - they can do the infusion there that AM before the procedure.  Pt has a T&S result there already.  Generally each unit of platelets approximately 20 mins to go in.  Usually they give 2 units and it takes about 1 hour total.  If pt goes to [facility] for the infusion - they have to do their own type and screen (can't accept [facility]'s results)  Pt would need to arrive early for the blood draw (takes about 1 hour) and would be able to receive PLTs after (takes about 1 hour).  This office would need to call [facility]'s Blood back the day before to make sure they get the O+ PLTs.    Left message for pt to c/b to discuss this.

## 2019-06-15 NOTE — Telephone Encounter (Signed)
Spoke with Merleen Nicely at [facility] - aware pt wants to come to [facility] for his PLT infusion.  She will order, schedule and contact the patient with instructions.  She is aware pt is having lab work 12/1.

## 2019-06-16 ENCOUNTER — Telehealth: Payer: Self-pay | Admitting: Hematology and Oncology

## 2019-06-16 ENCOUNTER — Other Ambulatory Visit: Payer: Self-pay | Admitting: Internal Medicine

## 2019-06-16 NOTE — Telephone Encounter (Signed)
Scheduled appt per 11/18 sch message - pt aware of appt date and time   

## 2019-06-20 NOTE — Progress Notes (Signed)
HPI: FU CAD and syncope.Cardiac cath 2/18 showed 30 LM, 50 LAD, aneurysmal RCA with 80 mid to distal; had DES to RCA at that time.Carotid dopplers 2/19 showed no significant stenosis. Admitted with sepsis 2/19 and also with atrial fibrillation, converted spontaneously to sinus; TSH low and synthroid adjusted. Placed on amiodarone.Amiodarone discontinued at previous office visit. Monitor performed May 2019 showed no significant arrhythmia. Echocardiogram August 2020 showed normal LV function.  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  Patient did see pulmonary for lung nodules and follow-up chest CT recommended 3 months, course of antibiotics prescribed. Seen by Dr. Graciela Husbands  and pacemaker recommended for recent syncope in the setting of first-degree AV block/left bundle branch block.  Patient subsequently had implantable loop recorder placed September 2020.   5-second pause documented and pacemaker has been planned.  Since last seen, patient denies increased dyspnea, chest pain, palpitations or syncope.  Current Outpatient Medications  Medication Sig Dispense Refill  . albuterol (PROVENTIL) (2.5 MG/3ML) 0.083% nebulizer solution 1 vial in nebulizer every 6 hours as needed Dx 496 120 mL 6  . ALPRAZolam (XANAX) 0.5 MG tablet Take 0.5 mg by mouth at bedtime.     [name] apixaban (ELIQUIS) 5 MG TABS tablet Take 1 tablet (5 mg total) by mouth 2 (two) times daily. 180 tablet 2  . atorvastatin (LIPITOR) 40 MG tablet Take 1 tablet (40 mg total) by mouth daily. 90 tablet 3  . Carboxymethylcellul-Glycerin (LUBRICATING EYE DROPS OP) Place 1 drop into both eyes 4 (four) times daily as needed (dry eyes).     . Cholecalciferol (VITAMIN D3) 2000 units capsule Take 2,000 Units by mouth daily.     . insulin glargine (LANTUS) 100 UNIT/ML  injection Inject 50 Units into the skin at bedtime.     [name] levothyroxine (SYNTHROID) 125 MCG tablet Take 125 mcg by mouth daily before breakfast.     . losartan (COZAAR) 50 MG tablet Take 1 tablet (50 mg total) by mouth daily. 90 tablet 3  . metFORMIN (GLUCOPHAGE) 500 MG tablet Take 500 mg by mouth 2 (two) times daily with a meal.    . Multiple Vitamins-Minerals (MEGA MULTIVITAMIN FOR MEN PO) Take 1 tablet by mouth daily.     . Olodaterol HCl (STRIVERDI RESPIMAT) 2.5 MCG/ACT AERS Inhale 1 puff into the lungs daily.    . OXYGEN Inhale 2 L into the lungs continuous.    . Semaglutide (OZEMPIC) 1 MG/DOSE SOPN Inject 1 mg into the skin every Friday.     . tamsulosin (FLOMAX) 0.4 MG CAPS capsule Take 1 capsule (0.4 mg total) by mouth at bedtime. 30 capsule 0  . tiotropium (SPIRIVA HANDIHALER) 18 MCG inhalation capsule Place 1 capsule (18 mcg total) into inhaler and inhale daily. 30 capsule 4   No current facility-administered medications for this visit.      Past Medical History:  Diagnosis Date  . 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 Anxiety   . Aortic aneurysm (HCC) 09/02/2017  . BENIGN PROSTATIC HYPERTROPHY 11/23/2009  . Cardiomyopathy (HCC) 08/28/2016  . Chronic systolic CHF (congestive heart failure) (HCC) 09/02/2017  . COPD (chronic obstructive pulmonary disease) (HCC)   . CORONARY ARTERY DISEASE 11/23/2009  . 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 DEGENERATIVE JOINT DISEASE 11/23/2009  . DEPRESSION 11/23/2009  . FATIGUE  11/23/2009  . GAIT DISTURBANCE 12/10/2009  . HEMOPTYSIS UNSPECIFIED 05/07/2010  . High cholesterol   . HYPERTENSION 07/30/2009  . HYPOTHYROIDISM 07/30/2009  . Ischemic cardiomyopathy 09/02/2017  . 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 On home oxygen therapy    "2-3L; 24/7" (09/10/2016)  . OSA on CPAP   . PTSD (post-traumatic stress disorder) 03/10/2012  . 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 RA (rheumatoid arthritis) ([place]) 06/11/2011   "qwhere" (09/10/2016)  .  RESPIRATORY FAILURE, CHRONIC 07/31/2009  . Scleritis of both eyes 03/17/2014  . Thrombocytopenia ([place])   . 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 Type II diabetes mellitus ([place])     Past Surgical History:  Procedure Laterality Date  . 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 ABDOMINAL EXPLORATION SURGERY  02/2004   w/LOA/notes 12/10/2010; small bowel obstruction repair with adhesiolysis   . BACK SURGERY    . CARDIAC CATHETERIZATION     2 heart caths in the past.  One in 2000s showed one ulcerated plaque  Rx medically; Second at [facility] Ar[facility] Endo 09/05/2016  . CATARACT EXTRACTION W/ INTRAOCULAR LENS  IMPLANT, BILATERAL Bilateral 2000s  . COLECTOMY     hx of remote ileum resection due to bleeding  . COLONOSCOPY WITH PROPOFOL N/A 01/22/2018   Procedure: COLONOSCOPY WITH PROPOFOL;  Surgeon: Gatha Mayer, MD;  Location: [facility];  Service: Endoscopy;  Laterality: N/A;  . CORONARY ANGIOPLASTY WITH STENT PLACEMENT  09/10/2016  . CORONARY STENT INTERVENTION N/A 09/10/2016   Procedure: Coronary Stent Intervention;  Surgeon: Burnell Blanks, MD;  Location: [place] CV LAB;  Service: Cardiovascular;  Laterality: N/A;  Distal RCA 4.0x16 Synergy  . ESOPHAGOGASTRODUODENOSCOPY (EGD) WITH PROPOFOL N/A 01/22/2018   Procedure: ESOPHAGOGASTRODUODENOSCOPY (EGD) WITH PROPOFOL;  Surgeon: Gatha Mayer, MD;  Location: [facility];  Service: Endoscopy;  Laterality: N/A;  . FEMORAL EMBOLOECTOMY Left 07/2000   with left leg ischemia; Dr. Kellie Simmering, vascular  . GANGLION CYST EXCISION Right    "wrist"; Dr. Daylene Katayama  . HOT HEMOSTASIS N/A 01/22/2018   Procedure: HOT HEMOSTASIS (ARGON PLASMA COAGULATION/BICAP);  Surgeon: Gatha Mayer, MD;  Location: [facility];  Service: Endoscopy;  Laterality: N/A;  . LOOP RECORDER INSERTION N/A 04/25/2019   Procedure: LOOP RECORDER INSERTION;  Surgeon: Deboraha Sprang, MD;  Location: [place] CV LAB;  Service: Cardiovascular;  Laterality: N/A;  . LUMBAR LAMINECTOMY   1972   Dr. Collie Siad  . RIGHT/LEFT HEART CATH AND CORONARY ANGIOGRAPHY N/A 09/10/2016   Procedure: Right/Left Heart Cath and Coronary Angiography;  Surgeon: Burnell Blanks, MD;  Location: [place] CV LAB;  Service: Cardiovascular;  Laterality: N/A;  . TONSILLECTOMY      Social History   Socioeconomic History  . Marital status: Married    Spouse name: Not on file  . Number of children: Not on file  . Years of education: Not on file  . Highest education level: Not on file  Occupational History  . 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 Financial resource strain: Not on file  . Food insecurity    Worry: Not on file    Inability: Not on file  . Transportation needs    Medical: Not on file    Non-medical: Not on file  Tobacco Use  . Smoking status: Former Smoker    Packs/day: 2.50    Years: 40.00    Pack years: 100.00    Types: Cigarettes, Pipe, Landscape architect  Quit date: 07/28/1998    Years since quitting: 20.9  . Smokeless tobacco: Never Used  Substance and Sexual Activity  . Alcohol use: No    Alcohol/week: 0.0 standard drinks  . Drug use: No  . Sexual activity: Not on file  Lifestyle  . Physical activity    Days per week: Not on file    Minutes per session: Not on file  . Stress: Not on file  Relationships  . Social Musician on phone: Not on file    Gets together: Not on file    Attends religious service: Not on file    Active member of club or organization: Not on file    Attends meetings of clubs or organizations: Not on file    Relationship status: Not on file  . Intimate partner violence    Fear of current or ex partner: Not on file    Emotionally abused: Not on file    Physically abused: Not on file    Forced sexual activity: Not on file  Other Topics Concern  . Not on file  Social History Narrative   Lives in the home with his wife   Sees [place] every 6 month for meds.   Denies asbestos exposure     Family History  Problem Relation Age of Onset  . Other Mother        gun shot    ROS: no fevers or chills, productive cough, hemoptysis, dysphasia, odynophagia, melena, hematochezia, dysuria, hematuria, rash, seizure activity, orthopnea, PND, pedal edema, claudication. Remaining systems are negative.  Physical Exam: Well-developed well-nourished in no acute distress.  Skin is warm and dry.  HEENT is normal.  Neck is supple.  Chest is clear to auscultation with normal expansion.  Cardiovascular exam is regular rate and rhythm.  Abdominal exam nontender or distended. No masses palpated. Extremities show no edema. neuro grossly intact   A/P  1 previous syncope-implantable loop monitor showed greater than 5-second pause.  Pacemaker has been planned for 12/4.  2 coronary artery disease-he has not had recurrent chest pain.  Continue statin.  No aspirin given need for apixaban.  3 paroxysmal atrial fibrillation-patient remains in sinus rhythm on exam.  Continue apixaban.  Amiodarone discontinued previously because of baseline lung disease and initial episode of atrial fibrillation occurred in the setting of urosepsis.  4 hypertension-blood pressure controlled.  Continue present medications.  5 hyperlipidemia-continue statin.  6 history of cardiomyopathy-LV function has improved on most recent study.  7 thoracic/abdominal/iliac aneurysms-follow-up CTA August 2021.  8 chronic combined systolic/diastolic congestive heart failure-doing well from a volume standpoint.  9 history of pulmonary nodules-managed by pulmonary.  Olga Millers, MD

## 2019-06-28 ENCOUNTER — Other Ambulatory Visit: Payer: Self-pay

## 2019-06-28 ENCOUNTER — Other Ambulatory Visit: Payer: Self-pay | Admitting: Internal Medicine

## 2019-06-28 ENCOUNTER — Ambulatory Visit: Payer: Medicare Other | Admitting: Cardiology

## 2019-06-28 ENCOUNTER — Encounter: Payer: Self-pay | Admitting: Cardiology

## 2019-06-28 ENCOUNTER — Other Ambulatory Visit (HOSPITAL_COMMUNITY)
Admission: RE | Admit: 2019-06-28 | Discharge: 2019-06-28 | Disposition: A | Discharge status: Home / Self Care | Payer: Medicare Other | Source: Ambulatory Visit | Attending: Internal Medicine | Admitting: Internal Medicine

## 2019-06-28 DIAGNOSIS — I2510 Atherosclerotic heart disease of native coronary artery without angina pectoris: Secondary | ICD-10-CM

## 2019-06-28 DIAGNOSIS — I480 Paroxysmal atrial fibrillation: Secondary | ICD-10-CM

## 2019-06-28 DIAGNOSIS — Z01812 Encounter for preprocedural laboratory examination: Secondary | ICD-10-CM | POA: Insufficient documentation

## 2019-06-28 DIAGNOSIS — Z20828 Contact with and (suspected) exposure to other viral communicable diseases: Secondary | ICD-10-CM | POA: Insufficient documentation

## 2019-06-28 NOTE — Patient Instructions (Signed)
Medication Instructions:  NO CHANGE *If you need a refill on your cardiac medications before your next appointment, please call your pharmacy*  Lab Work: Your physician recommends that you HAVE LAB WORK TODAY If you have labs (blood work) drawn today and your tests are completely normal, you will receive your results only by: . MyChart Message (if you have MyChart) OR . A paper copy in the mail If you have any lab test that is abnormal or we need to change your treatment, we will call you to review the results.  Follow-Up: At [facility], you and your health needs are our priority.  As part of our continuing mission to provide you with exceptional heart care, we have created designated Provider Care Teams.  These Care Teams include your primary Cardiologist (physician) and Advanced Practice Providers (APPs -  Physician Assistants and Nurse Practitioners) who all work together to provide you with the care you need, when you need it.  Your next appointment:   6 month(s)  The format for your next appointment:   Either In Person or Virtual  Provider:   You may see BRIAN CRENSHAW MD or one of the following Advanced Practice Providers on your designated Care Team:    Luke Kilroy, PA-C  Callie Goodrich, PA-C  Jesse Cleaver, FNP    

## 2019-06-28 NOTE — Telephone Encounter (Signed)
Called and spoke with short stay so they would be aware pt will probably be a little late Friday morning (12/4) coming from [facility] after receiving platelet transfusion.

## 2019-06-29 ENCOUNTER — Other Ambulatory Visit: Payer: Self-pay | Admitting: *Deleted

## 2019-06-29 DIAGNOSIS — D696 Thrombocytopenia, unspecified: Secondary | ICD-10-CM

## 2019-06-29 LAB — CBC
Hematocrit: 38.4 % (ref 37.5–51.0)
Hemoglobin: 12 g/dL — ABNORMAL LOW (ref 13.0–17.7)
MCH: 27.8 pg (ref 26.6–33.0)
MCHC: 31.3 g/dL — ABNORMAL LOW (ref 31.5–35.7)
MCV: 89 fL (ref 79–97)
Platelets: 44 10*3/uL — CL (ref 150–450)
RBC: 4.32 x10E6/uL (ref 4.14–5.80)
RDW: 13.8 % (ref 11.6–15.4)
WBC: 4.2 10*3/uL (ref 3.4–10.8)

## 2019-06-29 LAB — BASIC METABOLIC PANEL
BUN/Creatinine Ratio: 13 (ref 10–24)
BUN: 18 mg/dL (ref 8–27)
CO2: 25 mmol/L (ref 20–29)
Calcium: 9.1 mg/dL (ref 8.6–10.2)
Chloride: 102 mmol/L (ref 96–106)
Creatinine, Ser: 1.42 mg/dL — ABNORMAL HIGH (ref 0.76–1.27)
GFR calc Af Amer: 55 mL/min/{1.73_m2} — ABNORMAL LOW (ref >59)
GFR calc non Af Amer: 47 mL/min/{1.73_m2} — ABNORMAL LOW (ref >59)
Glucose: 253 mg/dL — ABNORMAL HIGH (ref 65–99)
Potassium: 5.2 mmol/L (ref 3.5–5.2)
Sodium: 140 mmol/L (ref 134–144)

## 2019-06-29 LAB — NOVEL CORONAVIRUS, NAA (HOSP ORDER, SEND-OUT TO REF LAB; TAT 18-24 HRS): SARS-CoV-2, NAA: NOT DETECTED

## 2019-07-01 ENCOUNTER — Encounter (HOSPITAL_COMMUNITY)
Admission: RE | Disposition: A | Discharge status: Home / Self Care | Payer: Self-pay | Source: Home / Self Care | Attending: Internal Medicine

## 2019-07-01 ENCOUNTER — Ambulatory Visit (HOSPITAL_COMMUNITY)
Admission: RE | Admit: 2019-07-01 | Discharge: 2019-07-01 | Disposition: A | Discharge status: Home / Self Care | Payer: Medicare Other | Attending: Internal Medicine | Admitting: Internal Medicine

## 2019-07-01 ENCOUNTER — Other Ambulatory Visit: Payer: Self-pay | Admitting: *Deleted

## 2019-07-01 ENCOUNTER — Other Ambulatory Visit: Payer: Self-pay

## 2019-07-01 ENCOUNTER — Ambulatory Visit (HOSPITAL_COMMUNITY): Payer: Medicare Other

## 2019-07-01 ENCOUNTER — Inpatient Hospital Stay: Payer: Medicare Other | Attending: Hematology and Oncology

## 2019-07-01 DIAGNOSIS — Z794 Long term (current) use of insulin: Secondary | ICD-10-CM | POA: Insufficient documentation

## 2019-07-01 DIAGNOSIS — E785 Hyperlipidemia, unspecified: Secondary | ICD-10-CM | POA: Diagnosis not present

## 2019-07-01 DIAGNOSIS — I5042 Chronic combined systolic (congestive) and diastolic (congestive) heart failure: Secondary | ICD-10-CM | POA: Insufficient documentation

## 2019-07-01 DIAGNOSIS — Z7901 Long term (current) use of anticoagulants: Secondary | ICD-10-CM | POA: Insufficient documentation

## 2019-07-01 DIAGNOSIS — Z8679 Personal history of other diseases of the circulatory system: Secondary | ICD-10-CM | POA: Insufficient documentation

## 2019-07-01 DIAGNOSIS — D631 Anemia in chronic kidney disease: Secondary | ICD-10-CM | POA: Diagnosis not present

## 2019-07-01 DIAGNOSIS — I2510 Atherosclerotic heart disease of native coronary artery without angina pectoris: Secondary | ICD-10-CM | POA: Insufficient documentation

## 2019-07-01 DIAGNOSIS — D696 Thrombocytopenia, unspecified: Secondary | ICD-10-CM

## 2019-07-01 DIAGNOSIS — I255 Ischemic cardiomyopathy: Secondary | ICD-10-CM | POA: Diagnosis not present

## 2019-07-01 DIAGNOSIS — I480 Paroxysmal atrial fibrillation: Secondary | ICD-10-CM | POA: Diagnosis not present

## 2019-07-01 DIAGNOSIS — D649 Anemia, unspecified: Secondary | ICD-10-CM

## 2019-07-01 DIAGNOSIS — I469 Cardiac arrest, cause unspecified: Secondary | ICD-10-CM

## 2019-07-01 DIAGNOSIS — Z9981 Dependence on supplemental oxygen: Secondary | ICD-10-CM | POA: Insufficient documentation

## 2019-07-01 DIAGNOSIS — E039 Hypothyroidism, unspecified: Secondary | ICD-10-CM | POA: Diagnosis not present

## 2019-07-01 DIAGNOSIS — N189 Chronic kidney disease, unspecified: Secondary | ICD-10-CM | POA: Insufficient documentation

## 2019-07-01 DIAGNOSIS — Z79890 Hormone replacement therapy (postmenopausal): Secondary | ICD-10-CM | POA: Diagnosis not present

## 2019-07-01 DIAGNOSIS — I442 Atrioventricular block, complete: Secondary | ICD-10-CM | POA: Insufficient documentation

## 2019-07-01 DIAGNOSIS — Z87891 Personal history of nicotine dependence: Secondary | ICD-10-CM | POA: Diagnosis not present

## 2019-07-01 DIAGNOSIS — J439 Emphysema, unspecified: Secondary | ICD-10-CM | POA: Insufficient documentation

## 2019-07-01 DIAGNOSIS — E119 Type 2 diabetes mellitus without complications: Secondary | ICD-10-CM | POA: Insufficient documentation

## 2019-07-01 DIAGNOSIS — Z950 Presence of cardiac pacemaker: Secondary | ICD-10-CM

## 2019-07-01 DIAGNOSIS — Z79899 Other long term (current) drug therapy: Secondary | ICD-10-CM | POA: Insufficient documentation

## 2019-07-01 DIAGNOSIS — I110 Hypertensive heart disease with heart failure: Secondary | ICD-10-CM | POA: Diagnosis not present

## 2019-07-01 DIAGNOSIS — M069 Rheumatoid arthritis, unspecified: Secondary | ICD-10-CM | POA: Diagnosis not present

## 2019-07-01 HISTORY — PX: PACEMAKER IMPLANT: EP1218

## 2019-07-01 HISTORY — PX: LOOP RECORDER REMOVAL: EP1215

## 2019-07-01 LAB — SURGICAL PCR SCREEN
MRSA, PCR: NEGATIVE
Staphylococcus aureus: NEGATIVE

## 2019-07-01 SURGERY — PACEMAKER IMPLANT

## 2019-07-01 MED ORDER — ONDANSETRON HCL 4 MG/2ML IJ SOLN: 4.0000 mg | Freq: Four times a day (QID) | INTRAMUSCULAR | Status: DC | PRN

## 2019-07-01 MED ORDER — MIDAZOLAM HCL 5 MG/5ML IJ SOLN
INTRAMUSCULAR | Status: DC | PRN
  Administered 2019-07-01: 1 mg via INTRAVENOUS
  Administered 2019-07-01 (×2): 0.5 mg via INTRAVENOUS

## 2019-07-01 MED ORDER — CEFAZOLIN SODIUM-DEXTROSE 2-4 GM/100ML-% IV SOLN
2.0000 g | INTRAVENOUS | Status: AC
  Administered 2019-07-01: 2 g via INTRAVENOUS

## 2019-07-01 MED ORDER — CEFAZOLIN SODIUM-DEXTROSE 2-4 GM/100ML-% IV SOLN
INTRAVENOUS | Status: AC
  Filled 2019-07-01: qty 100

## 2019-07-01 MED ORDER — DIPHENHYDRAMINE HCL 25 MG PO CAPS
ORAL_CAPSULE | ORAL | Status: AC
  Filled 2019-07-01: qty 1

## 2019-07-01 MED ORDER — SODIUM CHLORIDE 0.9 % IV SOLN: INTRAVENOUS | Status: DC

## 2019-07-01 MED ORDER — MIDAZOLAM HCL 5 MG/5ML IJ SOLN
INTRAMUSCULAR | Status: AC
  Filled 2019-07-01: qty 5

## 2019-07-01 MED ORDER — IOHEXOL 350 MG/ML SOLN
INTRAVENOUS | Status: DC | PRN
  Administered 2019-07-01: 15 mL

## 2019-07-01 MED ORDER — HYDRALAZINE HCL 20 MG/ML IJ SOLN
INTRAMUSCULAR | Status: DC | PRN
  Administered 2019-07-01 (×2): 10 mg via INTRAVENOUS

## 2019-07-01 MED ORDER — FENTANYL CITRATE (PF) 100 MCG/2ML IJ SOLN
INTRAMUSCULAR | Status: AC
  Filled 2019-07-01: qty 2

## 2019-07-01 MED ORDER — HEPARIN (PORCINE) IN NACL 1000-0.9 UT/500ML-% IV SOLN
INTRAVENOUS | Status: AC
  Filled 2019-07-01: qty 500

## 2019-07-01 MED ORDER — LIDOCAINE HCL (PF) 1 % IJ SOLN
INTRAMUSCULAR | Status: DC | PRN
  Administered 2019-07-01: 60 mL

## 2019-07-01 MED ORDER — MUPIROCIN 2 % EX OINT
TOPICAL_OINTMENT | CUTANEOUS | Status: AC
  Administered 2019-07-01: 12:00:00
  Filled 2019-07-01: qty 22

## 2019-07-01 MED ORDER — FENTANYL CITRATE (PF) 100 MCG/2ML IJ SOLN
INTRAMUSCULAR | Status: DC | PRN
  Administered 2019-07-01 (×2): 12.5 ug via INTRAVENOUS

## 2019-07-01 MED ORDER — HEPARIN (PORCINE) IN NACL 1000-0.9 UT/500ML-% IV SOLN
INTRAVENOUS | Status: DC | PRN
  Administered 2019-07-01: 500 mL

## 2019-07-01 MED ORDER — LIDOCAINE HCL (PF) 1 % IJ SOLN
INTRAMUSCULAR | Status: AC
  Filled 2019-07-01: qty 60

## 2019-07-01 MED ORDER — SODIUM CHLORIDE 0.9% FLUSH
3.0000 mL | INTRAVENOUS | Status: DC | PRN
  Filled 2019-07-01: qty 10

## 2019-07-01 MED ORDER — CHLORHEXIDINE GLUCONATE 4 % EX LIQD: 4.0000 "application " | Freq: Once | CUTANEOUS | Status: DC

## 2019-07-01 MED ORDER — SODIUM CHLORIDE 0.9 % IV SOLN
INTRAVENOUS | Status: DC
  Administered 2019-07-01: 12:00:00 via INTRAVENOUS

## 2019-07-01 MED ORDER — HYDRALAZINE HCL 20 MG/ML IJ SOLN
INTRAMUSCULAR | Status: AC
  Filled 2019-07-01: qty 1

## 2019-07-01 MED ORDER — DIPHENHYDRAMINE HCL 25 MG PO CAPS
25.0000 mg | ORAL_CAPSULE | Freq: Once | ORAL | Status: AC
  Administered 2019-07-01: 08:00:00 25 mg via ORAL

## 2019-07-01 MED ORDER — ACETAMINOPHEN 325 MG PO TABS
650.0000 mg | ORAL_TABLET | Freq: Once | ORAL | Status: AC
  Administered 2019-07-01: 650 mg via ORAL

## 2019-07-01 MED ORDER — ACETAMINOPHEN 325 MG PO TABS
325.0000 mg | ORAL_TABLET | ORAL | Status: DC | PRN
  Filled 2019-07-01: qty 2

## 2019-07-01 MED ORDER — HEPARIN SOD (PORK) LOCK FLUSH 100 UNIT/ML IV SOLN
250.0000 [IU] | INTRAVENOUS | Status: DC | PRN
  Filled 2019-07-01: qty 5

## 2019-07-01 MED ORDER — SODIUM CHLORIDE 0.9 % IV SOLN
INTRAVENOUS | Status: AC
  Filled 2019-07-01: qty 2

## 2019-07-01 MED ORDER — SODIUM CHLORIDE 0.9 % IV SOLN
80.0000 mg | INTRAVENOUS | Status: AC
  Administered 2019-07-01: 80 mg

## 2019-07-01 MED ORDER — ACETAMINOPHEN 325 MG PO TABS
ORAL_TABLET | ORAL | Status: AC
  Filled 2019-07-01: qty 2

## 2019-07-01 MED ORDER — HEPARIN SOD (PORK) LOCK FLUSH 100 UNIT/ML IV SOLN
500.0000 [IU] | Freq: Every day | INTRAVENOUS | Status: DC | PRN
  Filled 2019-07-01: qty 5

## 2019-07-01 MED ORDER — SODIUM CHLORIDE 0.9% FLUSH
10.0000 mL | INTRAVENOUS | Status: DC | PRN
  Filled 2019-07-01: qty 10

## 2019-07-01 MED ORDER — SODIUM CHLORIDE 0.9% IV SOLUTION
250.0000 mL | Freq: Once | INTRAVENOUS | Status: AC
  Administered 2019-07-01: 08:00:00 250 mL via INTRAVENOUS
  Filled 2019-07-01: qty 250

## 2019-07-01 SURGICAL SUPPLY — 13 items
ADH SKN CLS APL DERMABOND .7 (GAUZE/BANDAGES/DRESSINGS) ×1
CABLE SURGICAL S-101-97-12 (CABLE) ×3 IMPLANT
DERMABOND ADVANCED (GAUZE/BANDAGES/DRESSINGS) ×2
DERMABOND ADVANCED .7 DNX12 (GAUZE/BANDAGES/DRESSINGS) ×1 IMPLANT
HEMOSTAT SURGICEL 2X4 FIBR (HEMOSTASIS) ×2 IMPLANT
IPG PACE AZUR XT DR MRI W1DR01 (Pacemaker) IMPLANT
KIT MICROPUNCTURE NIT STIFF (SHEATH) ×3 IMPLANT
LEAD CAPSURE NOVUS 5076-52CM (Lead) ×3 IMPLANT
LEAD CAPSURE NOVUS 5076-58CM (Lead) ×2 IMPLANT
PACE AZURE XT DR MRI W1DR01 (Pacemaker) ×3 IMPLANT
PAD PRO RADIOLUCENT 2001M-C (PAD) ×3 IMPLANT
SHEATH 7FR PRELUDE SNAP 13 (SHEATH) ×4 IMPLANT
TRAY PACEMAKER INSERTION (PACKS) ×3 IMPLANT

## 2019-07-01 NOTE — Progress Notes (Signed)
CXR .Personally reviewed  Good lead position  Device interrogation normal device function  No rub  No hematoma

## 2019-07-01 NOTE — Progress Notes (Signed)
Report received from [facility].  Care assumed at this time

## 2019-07-01 NOTE — Discharge Instructions (Signed)
Pacemaker Implantation, Adult, Care After °This sheet gives you information about how to care for yourself after your procedure. Your health care provider may also give you more specific instructions. If you have problems or questions, contact your health care provider. °What can I expect after the procedure? °After the procedure, it is common to have: °· Mild pain. °· Slight bruising. °· Some swelling over the incision. °· A slight bump over the skin where the device was placed. Sometimes, it is possible to feel the device under the skin. This is normal. °Follow these instructions at home: °Medicines °· Take over-the-counter and prescription medicines only as told by your health care provider. °· If you were prescribed an antibiotic medicine, take it as told by your health care provider. Do not stop taking the antibiotic even if you start to feel better. °Wound care ° °· Do not remove the bandage on your chest until directed to do so by your health care provider. °· After your bandage is removed, you may see pieces of tape called skin adhesive strips over the area where the cut was made (incision site). Let them fall off on their own. °· Check the incision site every day to make sure it is not infected, bleeding, or starting to pull apart. °· Do not use lotions or ointments near the incision site unless directed to do so. °· Keep the incision area clean and dry for 2-3 days after the procedure or as directed by your health care provider. It takes several weeks for the incision site to completely heal. °· Do not take baths, swim, or use a hot tub for 7-10 days or as otherwise directed by your health care provider. °Activity °· Do not drive or use heavy machinery while taking prescription pain medicine. °· Do not drive for 24 hours if you were given a medicine to help you relax (sedative). °· Check with your health care provider before you start to drive or play sports. °· Avoid sudden jerking, pulling, or chopping  movements that pull your upper arm far away from your body. Avoid these movements for at least 6 weeks or as long as told by your health care provider. °· Do not lift your upper arm above your shoulders for at least 6 weeks or as long as told by your health care provider. This means no tennis, golf, or swimming. °· You may go back to work when your health care provider says it is okay. °Pacemaker care °· You may be shown how to transfer data from your pacemaker through the phone to your health care provider. °· Always let all health care providers know about your pacemaker before you have any medical procedures or tests. °· Wear a medical ID bracelet or necklace stating that you have a pacemaker. Carry a pacemaker ID card with you at all times. °· Your pacemaker battery will last for 5-15 years. Routine checks by your health care provider will let the health care provider know when the battery is starting to run down. The pacemaker will need to be replaced when the battery starts to run down. °· Do not use amateur radio equipment or electric welding torches. Other electrical devices are safe to use, including power tools, lawn mowers, and speakers. If you are unsure of whether something is safe to use, ask your health care provider. °· When using your cell phone, hold it to the ear opposite the pacemaker. Do not leave your cell phone in a pocket over the pacemaker. °·   Avoid places or objects that have a strong electric or magnetic field, including: °? Airport security gates. When at the airport, let officials know that you have a pacemaker. °? Power plants. °? Large electrical generators. °? Radiofrequency transmission towers, such as cell phone and radio towers. °General instructions °· Weigh yourself every day. If you suddenly gain weight, fluid may be building up in your body. °· Keep all follow-up visits as told by your health care provider. This is important. °Contact a health care provider if: °· You gain  weight suddenly. °· Your legs or feet swell. °· It feels like your heart is fluttering or skipping beats (heart palpitations). °· You have chills or a fever. °· You have more redness, swelling, or pain around your incisions. °· You have more fluid or blood coming from your incisions. °· Your incisions feel warm to the touch. °· You have pus or a bad smell coming from your incisions. °Get help right away if: °· You have chest pain. °· You have trouble breathing or are short of breath. °· You become extremely tired. °· You are light-headed or you faint. °This information is not intended to replace advice given to you by your health care provider. Make sure you discuss any questions you have with your health care provider. °Document Released: 01/31/2005 Document Revised: 06/26/2017 Document Reviewed: 04/25/2016 °Elsevier Patient Education © 2020 [employer] ° °

## 2019-07-01 NOTE — Patient Instructions (Signed)

## 2019-07-01 NOTE — Progress Notes (Signed)
Discharge instructions reviewed with patient and family. Verbalized understanding. 

## 2019-07-04 ENCOUNTER — Ambulatory Visit (INDEPENDENT_AMBULATORY_CARE_PROVIDER_SITE_OTHER): Payer: Medicare Other | Admitting: *Deleted

## 2019-07-04 ENCOUNTER — Encounter (HOSPITAL_COMMUNITY): Payer: Self-pay | Admitting: Internal Medicine

## 2019-07-04 DIAGNOSIS — I480 Paroxysmal atrial fibrillation: Secondary | ICD-10-CM

## 2019-07-04 LAB — BPAM PLATELET PHERESIS
Blood Product Expiration Date: 202012052359
Blood Product Expiration Date: 202012052359
ISSUE DATE / TIME: 202012040840
ISSUE DATE / TIME: 202012040922
Unit Type and Rh: 5100
Unit Type and Rh: 5100

## 2019-07-04 LAB — PREPARE PLATELET PHERESIS
Unit division: 0
Unit division: 0

## 2019-07-04 LAB — CUP PACEART REMOTE DEVICE CHECK
Date Time Interrogation Session: 20201204140044
Implantable Pulse Generator Implant Date: 20200928

## 2019-07-04 NOTE — Progress Notes (Signed)
Patient Care Team: [name], MD as PCP - General (Internal Medicine) Clent Jacks, MD as Consulting Physician (Ophthalmology) Brand Males, MD as Consulting Physician (Pulmonary Disease) System, Provider Not In as Consulting Physician Lahoma Rocker, MD as Consulting Physician (Rheumatology) Rigoberto Noel, MD as Consulting Physician (Pulmonary Disease) Lelon Perla, MD as Consulting Physician (Cardiology)  DIAGNOSIS:    ICD-10-CM   1. Thrombocytopenia (HCC)  D69.6     CHIEF COMPLIANT: Follow-up of severe anemia and thrombocytopenia  INTERVAL HISTORY: [name] is a 78 y.o. with above-mentioned history of severe anemia due to chronic kidney disease and thrombocytopenia. He presents to the clinic todayfor follow-up.  Patient had pacemaker implantation last week and received platelet transfusion.  REVIEW OF SYSTEMS:   Constitutional: Denies fevers, chills or abnormal weight loss Eyes: Denies blurriness of vision Ears, nose, mouth, throat, and face: Denies mucositis or sore throat Respiratory: Denies cough, dyspnea or wheezes Cardiovascular: Denies palpitation, chest discomfort Gastrointestinal: Denies nausea, heartburn or change in bowel habits Skin: Bruises Lymphatics: Denies new lymphadenopathy or easy bruising Neurological: Denies numbness, tingling or new weaknesses Behavioral/Psych: Mood is stable, no new changes  Extremities: No lower extremity edema All other systems were reviewed with the patient and are negative.  I have reviewed the past medical history, past surgical history, social history and family history with the patient and they are unchanged from previous note.  ALLERGIES:  has No Known Allergies.  MEDICATIONS:  Current Outpatient Medications  Medication Sig Dispense Refill  . albuterol (PROVENTIL) (2.5 MG/3ML) 0.083% nebulizer solution 1 vial in nebulizer every 6 hours as needed Dx 496 120 mL 6  . ALPRAZolam (XANAX) 0.5  MG tablet Take 0.5 mg by mouth at bedtime.     [name] apixaban (ELIQUIS) 5 MG TABS tablet Take 1 tablet (5 mg total) by mouth 2 (two) times daily. 180 tablet 2  . atorvastatin (LIPITOR) 40 MG tablet Take 1 tablet (40 mg total) by mouth daily. 90 tablet 3  . Carboxymethylcellul-Glycerin (LUBRICATING EYE DROPS OP) Place 1 drop into both eyes 4 (four) times daily as needed (dry eyes).     . Cholecalciferol (VITAMIN D3) 2000 units capsule Take 2,000 Units by mouth daily.     . insulin glargine (LANTUS) 100 UNIT/ML injection Inject 50 Units into the skin at bedtime.     [name] levothyroxine (SYNTHROID) 125 MCG tablet Take 125 mcg by mouth daily before breakfast.     . losartan (COZAAR) 50 MG tablet Take 1 tablet (50 mg total) by mouth daily. 90 tablet 3  . metFORMIN (GLUCOPHAGE) 500 MG tablet Take 500 mg by mouth 2 (two) times daily with a meal.    . Multiple Vitamins-Minerals (MEGA MULTIVITAMIN FOR MEN PO) Take 1 tablet by mouth daily.     . Olodaterol HCl (STRIVERDI RESPIMAT) 2.5 MCG/ACT AERS Inhale 1 puff into the lungs daily.    . OXYGEN Inhale 2 L into the lungs continuous.    . Semaglutide (OZEMPIC) 1 MG/DOSE SOPN Inject 1 mg into the skin every Friday.     . tamsulosin (FLOMAX) 0.4 MG CAPS capsule Take 1 capsule (0.4 mg total) by mouth at bedtime. 30 capsule 0  . tiotropium (SPIRIVA HANDIHALER) 18 MCG inhalation capsule Place 1 capsule (18 mcg total) into inhaler and inhale daily. 30 capsule 4   No current facility-administered medications for this visit.     PHYSICAL EXAMINATION: ECOG PERFORMANCE STATUS: 1 - Symptomatic but completely ambulatory  Vitals:   07/05/19  0959  BP: (!) 141/84  Pulse: 75  Resp: 18  Temp: 98.5 F (36.9 C)  SpO2: 95%   Filed Weights   07/05/19 0959  Weight: 218 lb 14.4 oz (99.3 kg)    GENERAL: alert, no distress and comfortable SKIN: Bruises EYES: normal, Conjunctiva are pink and non-injected, sclera clear OROPHARYNX: no exudate, no erythema and lips, buccal  mucosa, and tongue normal  NECK: supple, thyroid normal size, non-tender, without nodularity LYMPH: no palpable lymphadenopathy in the cervical, axillary or inguinal LUNGS: clear to auscultation and percussion with normal breathing effort HEART: regular rate & rhythm and no murmurs and no lower extremity edema ABDOMEN: abdomen soft, non-tender and normal bowel sounds MUSCULOSKELETAL: no cyanosis of digits and no clubbing  NEURO: alert & oriented x 3 with fluent speech, no focal motor/sensory deficits EXTREMITIES: No lower extremity edema  LABORATORY DATA:  I have reviewed the data as listed CMP Latest Ref Rng & Units 06/28/2019 03/15/2019 09/23/2018  Glucose 65 - 99 mg/dL 357(S) 177(L) 390(Z)  BUN 8 - 27 mg/dL 18 00(P) 27  Creatinine 0.76 - 1.27 mg/dL 2.33(A) 0.76(A) 2.63  Sodium 134 - 144 mmol/L 140 135 142  Potassium 3.5 - 5.2 mmol/L 5.2 4.4 4.5  Chloride 96 - 106 mmol/L 102 101 102  CO2 20 - 29 mmol/L 25 23 24   Calcium 8.6 - 10.2 mg/dL 9.1 9.3 9.3  Total Protein 6.5 - 8.1 g/dL - - -  Total Bilirubin 0.3 - 1.2 mg/dL - - -  Alkaline Phos 38 - 126 U/L - - -  AST 15 - 41 U/L - - -  ALT 0 - 44 U/L - - -    Lab Results  Component Value Date   WBC 4.6 07/05/2019   HGB 11.1 (L) 07/05/2019   HCT 34.5 (L) 07/05/2019   MCV 86.3 07/05/2019   PLT 35 (L) 07/05/2019   NEUTROABS 2.8 07/05/2019    ASSESSMENT & PLAN:  Thrombocytopenia (HCC) 02/18/2018: Hemoglobin 8.6, MCV 83, WBC 3.5, platelets 86 creatinine 1.83 02/26/2018: Hemoglobin 9.1, platelets 92, MCV 85 03/11/2018: Hemoglobin 9.5, platelets 75, MCV 84, WBC 3 04/15/2018: Hemoglobin is 10.3, platelets 75 WBC 3.8 07/15/2018: Hemoglobin 10.6, platelets 63, WBC 4.3 01/03/2019: Hemoglobin 11.3, platelets 49, WBC 4.4, normal differential 03/25/2019: Hemoglobin 10.7, platelet count 37 04/05/2019: Hemoglobin 10.8, platelets 48 07/05/2019: Hemoglobin 11.1, platelets 35  We suspected Lyrica to be because of thrombocytopenia.  He stopped Lyrica  September 2020.  Unfortunately his platelets went down in spite of that. Patient had a pacemaker implantation received platelet infusion last week.  I suspect the cause of the thrombocytopenia is immune mediated.  Treatment plan: Prednisone 60 mg daily Return to clinic in 2 weeks for labs and follow-up.     No orders of the defined types were placed in this encounter.  The patient has a good understanding of the overall plan. he agrees with it. he will call with any problems that may develop before the next visit here.  October 2020, MD 07/05/2019  14/02/2019 Dorshimer, am acting as scribe for Dr. Jenness Corner.  I have reviewed the above documentation for accuracy and completeness, and I agree with the above.

## 2019-07-05 ENCOUNTER — Inpatient Hospital Stay: Payer: Medicare Other | Admitting: Hematology and Oncology

## 2019-07-05 ENCOUNTER — Inpatient Hospital Stay: Payer: Medicare Other

## 2019-07-05 ENCOUNTER — Other Ambulatory Visit: Payer: Self-pay

## 2019-07-05 DIAGNOSIS — D696 Thrombocytopenia, unspecified: Secondary | ICD-10-CM

## 2019-07-05 LAB — CBC WITH DIFFERENTIAL (CANCER CENTER ONLY)
Abs Immature Granulocytes: 0.02 10*3/uL (ref 0.00–0.07)
Basophils Absolute: 0 10*3/uL (ref 0.0–0.1)
Basophils Relative: 0 %
Eosinophils Absolute: 0 10*3/uL (ref 0.0–0.5)
Eosinophils Relative: 1 %
HCT: 34.5 % — ABNORMAL LOW (ref 39.0–52.0)
Hemoglobin: 11.1 g/dL — ABNORMAL LOW (ref 13.0–17.0)
Immature Granulocytes: 0 %
Lymphocytes Relative: 27 %
Lymphs Abs: 1.3 10*3/uL (ref 0.7–4.0)
MCH: 27.8 pg (ref 26.0–34.0)
MCHC: 32.2 g/dL (ref 30.0–36.0)
MCV: 86.3 fL (ref 80.0–100.0)
Monocytes Absolute: 0.6 10*3/uL (ref 0.1–1.0)
Monocytes Relative: 12 %
Neutro Abs: 2.8 10*3/uL (ref 1.7–7.7)
Neutrophils Relative %: 60 %
Platelet Count: 35 10*3/uL — ABNORMAL LOW (ref 150–400)
RBC: 4 MIL/uL — ABNORMAL LOW (ref 4.22–5.81)
RDW: 14.3 % (ref 11.5–15.5)
WBC Count: 4.6 10*3/uL (ref 4.0–10.5)
nRBC: 0 % (ref 0.0–0.2)

## 2019-07-05 LAB — SAMPLE TO BLOOD BANK

## 2019-07-05 MED ORDER — PREDNISONE 20 MG PO TABS: 60.0000 mg | ORAL_TABLET | Freq: Every day | ORAL | 0 refills | Status: DC

## 2019-07-05 NOTE — Assessment & Plan Note (Signed)
02/18/2018: Hemoglobin 8.6, MCV 83, WBC 3.5, platelets 86 creatinine 1.83 02/26/2018: Hemoglobin 9.1, platelets 92, MCV 85 03/11/2018: Hemoglobin 9.5, platelets 75, MCV 84, WBC 3 04/15/2018: Hemoglobin is 10.3, platelets 75 WBC 3.8 07/15/2018: Hemoglobin 10.6, platelets 63, WBC 4.3 01/03/2019: Hemoglobin 11.3, platelets 49, WBC 4.4, normal differential 03/25/2019: Hemoglobin 10.7, platelet count 37 04/05/2019: Hemoglobin 10.8, platelets 48 07/05/2019:  We suspected Lyrica to be because of thrombocytopenia.  He stopped Lyrica September 2020.

## 2019-07-06 ENCOUNTER — Telehealth: Payer: Self-pay | Admitting: Hematology and Oncology

## 2019-07-06 NOTE — Telephone Encounter (Signed)
I talk with patient regarding schedule  

## 2019-07-14 ENCOUNTER — Other Ambulatory Visit: Payer: Self-pay

## 2019-07-14 ENCOUNTER — Ambulatory Visit (INDEPENDENT_AMBULATORY_CARE_PROVIDER_SITE_OTHER): Payer: Medicare Other | Admitting: *Deleted

## 2019-07-14 DIAGNOSIS — I255 Ischemic cardiomyopathy: Secondary | ICD-10-CM

## 2019-07-14 NOTE — Patient Instructions (Signed)
Call office if you develop redness , increased swelling , or drainage from incision site.

## 2019-07-18 NOTE — Progress Notes (Signed)
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 with above-mentioned history of severe anemia due to chronic kidney diseaseand thrombocytopenia for which he is currently on 60mg  prednisone daily. He presents to the clinic todayfor follow-up.  REVIEW OF SYSTEMS:   Constitutional: Denies fevers, chills or abnormal weight loss Eyes: Denies blurriness of vision Ears, nose, mouth, throat, and face: Denies mucositis or sore throat Respiratory: Chronic shortness of breath uses oxygen 24/7 Cardiovascular: Denies palpitation, chest discomfort Gastrointestinal: Denies nausea, heartburn or change in bowel habits Skin: Denies abnormal skin rashes Lymphatics: Denies new lymphadenopathy or easy bruising Neurological: Denies numbness, tingling or new weaknesses Behavioral/Psych: Mood is stable, no new changes  Extremities: No lower extremity edema Breast: denies any pain or lumps or nodules in either breasts All other systems were reviewed with the patient and are negative.  I have reviewed the past medical history, past surgical history, social history and family history with the patient and they are unchanged from previous note.  ALLERGIES:  has No Known Allergies.  MEDICATIONS:  Current Outpatient Medications  Medication Sig Dispense Refill  . albuterol (PROVENTIL) (2.5 MG/3ML) 0.083%  nebulizer solution 1 vial in nebulizer every 6 hours as needed Dx 496 120 mL 6  . ALPRAZolam (XANAX) 0.5 MG tablet Take 0.5 mg by mouth at bedtime.     apixaban (ELIQUIS) 5 MG TABS tablet Take 1 tablet (5 mg total) by mouth 2 (two) times daily. 180 tablet 2  . atorvastatin (LIPITOR) 40 MG tablet Take 1 tablet (40 mg total) by mouth daily. 90 tablet 3  . Carboxymethylcellul-Glycerin (LUBRICATING EYE DROPS OP) Place 1 drop into both eyes 4 (four) times daily as needed (dry eyes).     . Cholecalciferol (VITAMIN D3) 2000 units capsule Take 2,000 Units by mouth daily.     . insulin glargine (LANTUS) 100 UNIT/ML injection Inject 50 Units into the skin at bedtime.     [name] levothyroxine (SYNTHROID) 125 MCG tablet Take 125 mcg by mouth daily before breakfast.     . losartan (COZAAR) 50 MG tablet Take 1 tablet (50 mg total) by mouth daily. 90 tablet 3  . metFORMIN (GLUCOPHAGE) 500 MG tablet Take 500 mg by mouth 2 (two) times daily with a meal.    . Multiple Vitamins-Minerals (MEGA MULTIVITAMIN FOR MEN PO) Take 1 tablet by mouth daily.     . Olodaterol HCl (STRIVERDI RESPIMAT) 2.5 MCG/ACT AERS Inhale 1 puff into the lungs daily.    . OXYGEN Inhale 2 L into the lungs continuous.    . predniSONE (DELTASONE) 20 MG tablet Take 3 tablets (60 mg total) by mouth daily with breakfast. 100 tablet 0  . Semaglutide (OZEMPIC) 1 MG/DOSE SOPN Inject 1 mg into the skin every Friday.     . tamsulosin (FLOMAX) 0.4 MG CAPS capsule Take 1 capsule (0.4 mg total) by mouth at bedtime. 30 capsule 0  . tiotropium (SPIRIVA HANDIHALER) 18 MCG inhalation capsule Place 1 capsule (  18 mcg total) into inhaler and inhale daily. 30 capsule 4   No current facility-administered medications for this visit.    PHYSICAL EXAMINATION: ECOG PERFORMANCE STATUS: 1 - Symptomatic but completely ambulatory  Vitals:   07/19/19 1005  BP: (!) 155/91  Pulse: (!) 54  Resp: 18  Temp: 98 F (36.7 C)  SpO2: 94%   Filed Weights   07/19/19 1005   Weight: 210 lb 14.4 oz (95.7 kg)    GENERAL: alert, no distress and comfortable SKIN: skin color, texture, turgor are normal, no rashes or significant lesions EYES: normal, Conjunctiva are pink and non-injected, sclera clear OROPHARYNX: no exudate, no erythema and lips, buccal mucosa, and tongue normal  NECK: supple, thyroid normal size, non-tender, without nodularity LYMPH: no palpable lymphadenopathy in the cervical, axillary or inguinal LUNGS: Diminished breath sounds lung bases, uses oxygen 24/7 HEART: regular rate & rhythm and no murmurs and no lower extremity edema ABDOMEN: abdomen soft, non-tender and normal bowel sounds MUSCULOSKELETAL: no cyanosis of digits and no clubbing  NEURO: alert & oriented x 3 with fluent speech, no focal motor/sensory deficits, uses a cane to walk because of instability of gait. EXTREMITIES: No lower extremity edema  LABORATORY DATA:  I have reviewed the data as listed CMP Latest Ref Rng & Units 06/28/2019 03/15/2019 09/23/2018  Glucose 65 - 99 mg/dL 253(H) 257(H) 121(H)  BUN 8 - 27 mg/dL 18 25(H) 27  Creatinine 0.76 - 1.27 mg/dL 1.42(H) 1.36(H) 1.23  Sodium 134 - 144 mmol/L 140 135 142  Potassium 3.5 - 5.2 mmol/L 5.2 4.4 4.5  Chloride 96 - 106 mmol/L 102 101 102  CO2 20 - 29 mmol/L 25 23 24   Calcium 8.6 - 10.2 mg/dL 9.1 9.3 9.3  Total Protein 6.5 - 8.1 g/dL - - -  Total Bilirubin 0.3 - 1.2 mg/dL - - -  Alkaline Phos 38 - 126 U/L - - -  AST 15 - 41 U/L - - -  ALT 0 - 44 U/L - - -    Lab Results  Component Value Date   WBC 11.5 (H) 07/19/2019   HGB 12.0 (L) 07/19/2019   HCT 37.9 (L) 07/19/2019   MCV 86.7 07/19/2019   PLT 78 (L) 07/19/2019   NEUTROABS 8.8 (H) 07/19/2019    ASSESSMENT & PLAN:  Acute ITP ([facility]) 02/18/2018: Hemoglobin 8.6, MCV 83, WBC 3.5, platelets 86 creatinine 1.83 02/26/2018: Hemoglobin 9.1, platelets 92, MCV 85 03/11/2018: Hemoglobin 9.5, platelets 75, MCV 84, WBC 3 04/15/2018: Hemoglobin is 10.3, platelets 75 WBC  3.8 07/15/2018: Hemoglobin 10.6, platelets 63, WBC 4.3 01/03/2019: Hemoglobin 11.3, platelets 49, WBC 4.4, normal differential 03/25/2019: Hemoglobin 10.7, platelet count 37 04/05/2019:Hemoglobin 10.8, platelets 48 07/05/2019: Hemoglobin 11.1, platelets 35  Current treatment: Prednisone 60 mg daily started 07/05/2019 Lab review: 07/19/2019: Platelet count 78, hemoglobin 12  We will start tapering the prednisone dose down and I gave him a taper schedule Return to clinic in 4 weeks with labs and follow-up.    Orders Placed This Encounter  Procedures  . CBC with Differential (Cancer Center Only)    Standing Status:   Future    Standing Expiration Date:   07/18/2020   The patient has a good understanding of the overall plan. he agrees with it. he will call with any problems that may develop before the next visit here.  Nicholas Lose, MD 07/19/2019  Julious Oka Dorshimer, am acting as scribe for Dr. Nicholas Lose.  I have reviewed the above document for accuracy and  completeness, and I agree with the above.

## 2019-07-19 ENCOUNTER — Inpatient Hospital Stay (HOSPITAL_BASED_OUTPATIENT_CLINIC_OR_DEPARTMENT_OTHER): Payer: Medicare Other | Admitting: Hematology and Oncology

## 2019-07-19 ENCOUNTER — Other Ambulatory Visit: Payer: Self-pay

## 2019-07-19 ENCOUNTER — Inpatient Hospital Stay: Payer: Medicare Other

## 2019-07-19 DIAGNOSIS — D693 Immune thrombocytopenic purpura: Secondary | ICD-10-CM | POA: Diagnosis not present

## 2019-07-19 DIAGNOSIS — D696 Thrombocytopenia, unspecified: Secondary | ICD-10-CM

## 2019-07-19 LAB — CUP PACEART INCLINIC DEVICE CHECK
Battery Remaining Longevity: 172 mo
Battery Voltage: 3.23 V
Brady Statistic AP VP Percent: 10.24 %
Brady Statistic AP VS Percent: 3.05 %
Brady Statistic AS VP Percent: 2.86 %
Brady Statistic AS VS Percent: 83.84 %
Brady Statistic RA Percent Paced: 13.27 %
Brady Statistic RV Percent Paced: 13.1 %
Date Time Interrogation Session: 20201217091200
Implantable Lead Implant Date: 20201204
Implantable Lead Implant Date: 20201204
Implantable Lead Location: 753859
Implantable Lead Location: 753860
Implantable Lead Model: 5076
Implantable Lead Model: 5076
Implantable Pulse Generator Implant Date: 20201204
Lead Channel Impedance Value: 380 Ohm
Lead Channel Impedance Value: 418 Ohm
Lead Channel Impedance Value: 437 Ohm
Lead Channel Impedance Value: 494 Ohm
Lead Channel Pacing Threshold Amplitude: 0.5 V
Lead Channel Pacing Threshold Amplitude: 0.75 V
Lead Channel Pacing Threshold Pulse Width: 0.4 ms
Lead Channel Pacing Threshold Pulse Width: 0.4 ms
Lead Channel Sensing Intrinsic Amplitude: 1.875 mV
Lead Channel Sensing Intrinsic Amplitude: 12.25 mV
Lead Channel Setting Pacing Amplitude: 3.5 V
Lead Channel Setting Pacing Amplitude: 3.5 V
Lead Channel Setting Pacing Pulse Width: 0.4 ms
Lead Channel Setting Sensing Sensitivity: 2 mV

## 2019-07-19 LAB — CBC WITH DIFFERENTIAL (CANCER CENTER ONLY)
Abs Immature Granulocytes: 0.07 10*3/uL (ref 0.00–0.07)
Basophils Absolute: 0 10*3/uL (ref 0.0–0.1)
Basophils Relative: 0 %
Eosinophils Absolute: 0 10*3/uL (ref 0.0–0.5)
Eosinophils Relative: 0 %
HCT: 37.9 % — ABNORMAL LOW (ref 39.0–52.0)
Hemoglobin: 12 g/dL — ABNORMAL LOW (ref 13.0–17.0)
Immature Granulocytes: 1 %
Lymphocytes Relative: 15 %
Lymphs Abs: 1.7 10*3/uL (ref 0.7–4.0)
MCH: 27.5 pg (ref 26.0–34.0)
MCHC: 31.7 g/dL (ref 30.0–36.0)
MCV: 86.7 fL (ref 80.0–100.0)
Monocytes Absolute: 0.9 10*3/uL (ref 0.1–1.0)
Monocytes Relative: 8 %
Neutro Abs: 8.8 10*3/uL — ABNORMAL HIGH (ref 1.7–7.7)
Neutrophils Relative %: 76 %
Platelet Count: 78 10*3/uL — ABNORMAL LOW (ref 150–400)
RBC: 4.37 MIL/uL (ref 4.22–5.81)
RDW: 14.4 % (ref 11.5–15.5)
WBC Count: 11.5 10*3/uL — ABNORMAL HIGH (ref 4.0–10.5)
nRBC: 0 % (ref 0.0–0.2)

## 2019-07-19 LAB — IMMATURE PLATELET FRACTION: Immature Platelet Fraction: 2.8 % (ref 1.2–8.6)

## 2019-07-19 MED ORDER — PREDNISONE 20 MG PO TABS: 60.0000 mg | ORAL_TABLET | Freq: Every day | ORAL | 0 refills | Status: DC

## 2019-07-19 NOTE — Assessment & Plan Note (Signed)
02/18/2018: Hemoglobin 8.6, MCV 83, WBC 3.5, platelets 86 creatinine 1.83 02/26/2018: Hemoglobin 9.1, platelets 92, MCV 85 03/11/2018: Hemoglobin 9.5, platelets 75, MCV 84, WBC 3 04/15/2018: Hemoglobin is 10.3, platelets 75 WBC 3.8 07/15/2018: Hemoglobin 10.6, platelets 63, WBC 4.3 01/03/2019: Hemoglobin 11.3, platelets 49, WBC 4.4, normal differential 03/25/2019: Hemoglobin 10.7, platelet count 37 04/05/2019:Hemoglobin 10.8, platelets 48 07/05/2019: Hemoglobin 11.1, platelets 35  Current treatment: Prednisone 60 mg daily started 07/05/2019 Lab review: 07/19/2019:

## 2019-07-19 NOTE — Progress Notes (Signed)
Wound check appointment. Dermabond removed. Wound without redness or edema. Incision edges approximated, wound well healed. Normal device function. Thresholds, sensing, and impedances consistent with implant measurements. Device programmed at 3.5V/auto capture programmed on for extra safety margin until 3 month visit. Histogram distribution appropriate for patient and level of activity. No mode switches or high ventricular rates noted. Patient educated about wound care, arm mobility, lifting restrictions. Remote transmission scheduled for 3/11/21Follow up with Dr Caryl Comes on 10/10/19.

## 2019-07-20 ENCOUNTER — Telehealth: Payer: Self-pay | Admitting: Hematology and Oncology

## 2019-07-20 NOTE — Telephone Encounter (Signed)
I left a message regarding schedule  

## 2019-07-27 ENCOUNTER — Encounter: Payer: Self-pay | Admitting: Internal Medicine

## 2019-07-27 ENCOUNTER — Ambulatory Visit (INDEPENDENT_AMBULATORY_CARE_PROVIDER_SITE_OTHER): Payer: Medicare Other | Admitting: Internal Medicine

## 2019-07-27 DIAGNOSIS — E118 Type 2 diabetes mellitus with unspecified complications: Secondary | ICD-10-CM

## 2019-07-27 DIAGNOSIS — Z794 Long term (current) use of insulin: Secondary | ICD-10-CM

## 2019-07-27 DIAGNOSIS — D693 Immune thrombocytopenic purpura: Secondary | ICD-10-CM

## 2019-07-27 NOTE — Progress Notes (Signed)
Virtual Visit via Video Note  I connected with [name] on 07/27/19 at  9:00 AM EST by a video enabled telemedicine application and verified that I am speaking with the correct person using two identifiers.  The patient and the provider were at separate locations throughout the entire encounter.   I discussed the limitations of evaluation and management by telemedicine and the availability of in person appointments. The patient expressed understanding and agreed to proceed. The patient and the provider were the only parties present for the visit unless noted in HPI below.  History of Present Illness: The patient is a 78 y.o. man with visit for follow up diabetes (taking metformin and ozempic and lantus, HgA1c 9.2 around Thanksgiving at [place] and they adjusted his lantus, sugars running 96 yesterday morning, last night 167, currently on prednisone 50 mg daily for ITP, denies nosebleeds, bruising, blood in stool) and acute itp (seeing oncology and on prednisone 50 mg daily currently, platelets are improving gradually, last 78, denies nosebleeds or blood in stool, no excessive bruising).  October: @ [facility] eye exam  Observations/Objective: Appearance: normal, breathing appears normal, oxygen cannula in place, casual grooming, abdomen does not appear distended, throat normal, memory normal, mental status is A and O times 3  Assessment and Plan: See problem oriented charting  Follow Up Instructions: continue current plan and medications, [facility] with [facility] and [facility], follow up virtual 3-6 months  I discussed the assessment and treatment plan with the patient. The patient was provided an opportunity to ask questions and all were answered. The patient agreed with the plan and demonstrated an understanding of the instructions.   The patient was advised to call back or seek an in-person evaluation if the symptoms worsen or if the condition fails to improve as anticipated.  [name],  MD

## 2019-07-27 NOTE — Assessment & Plan Note (Signed)
Platelet are improving most recently, on prednisone 50 mg daily and plan with hematology to taper that as able. Continue to follow with hematology. No worrisome signs of bleeding or bruising.

## 2019-07-27 NOTE — Assessment & Plan Note (Signed)
HgA1c elevated and lantus dosing adjusted. On prednisone currently which is worsening control. Last week of sugar readings reviewed with him and at goal. [facility] Hg[facility] and can be adjusted as needed. Call for sugars >250.

## 2019-07-28 ENCOUNTER — Encounter: Payer: Medicare Other | Admitting: Internal Medicine

## 2019-07-31 NOTE — Progress Notes (Signed)
ILR remote 

## 2019-08-08 ENCOUNTER — Inpatient Hospital Stay: Admission: RE | Admit: 2019-08-08 | Payer: Medicare Other | Source: Ambulatory Visit

## 2019-08-12 ENCOUNTER — Telehealth: Payer: Self-pay | Admitting: Pulmonary Disease

## 2019-08-12 ENCOUNTER — Telehealth: Payer: Self-pay | Admitting: Physical Medicine and Rehabilitation

## 2019-08-12 NOTE — Telephone Encounter (Signed)
ok 

## 2019-08-12 NOTE — Telephone Encounter (Signed)
Called the patient and made him aware of the response received from Dr. Vassie Loll. Patient voiced understanding. Nothing further needed at this time.

## 2019-08-12 NOTE — Telephone Encounter (Signed)
Scheduled for [id number] on 1/28. Patient now has [employer]. Is auth needed?

## 2019-08-12 NOTE — Telephone Encounter (Signed)
Called and spoke with pt who stated he has an appt scheduled for CT 1/27 and then has a follow up OV with RA 1/28.   While speaking with pt, he stated that he had a fever of 101 last night 1/14 but when he checked his temp today 1/15, his temp was 98.5. pt is still able to smell and taste. Pt said he is coughing up clear mucus.  Pt states the only time he has breathing problems is when he stands up to walk around but also states he has back issues and this has really been bothering him.    Pt said other than the temp last night, he has not had any fever nor has he had any other related symptoms of covid.   Pt had a pacemaker placed 3--4 weeks ago and had to be tested for covid prior to that and he did test negative.  Dr. Vassie Loll, please advise on this if there is anything pt needs to do and also if he is okay to still get the CT as scheduled followed by the OV with you?

## 2019-08-12 NOTE — Telephone Encounter (Signed)
Lets watch and see how he does over the weekend. If fever returns he should call us and we can Covid testing again.  If no other symptoms and he can proceed with CT as planned

## 2019-08-15 NOTE — Progress Notes (Signed)
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 with above-mentioned history of severe anemia due to chronic kidney diseaseand thrombocytopenia for which he is currently on a prednisone taper. He presents to the clinic todayfor follow-up.  ALLERGIES:  has No Known Allergies.  MEDICATIONS:  Current Outpatient Medications  Medication Sig Dispense Refill  . albuterol (PROVENTIL) (2.5 MG/3ML) 0.083% nebulizer solution 1 vial in nebulizer every 6 hours as needed Dx 496 120 mL 6  . ALPRAZolam (XANAX) 0.5 MG tablet Take 0.5 mg by mouth at bedtime.     [name] apixaban (ELIQUIS) 5 MG TABS tablet Take 1 tablet (5 mg total) by mouth 2 (two) times daily. 180 tablet 2  . atorvastatin (LIPITOR) 40 MG tablet Take 1 tablet (40 mg total) by mouth daily. 90 tablet 3  . Carboxymethylcellul-Glycerin (LUBRICATING EYE DROPS OP) Place 1 drop into both eyes 4 (four) times daily as needed (dry eyes).     . Cholecalciferol (VITAMIN D3) 2000 units capsule Take 2,000 Units by mouth daily.     . insulin glargine (LANTUS) 100 UNIT/ML injection Inject 50 Units into the skin at bedtime.     [name] levothyroxine (SYNTHROID) 125 MCG tablet Take 125 mcg by mouth daily before breakfast.     . losartan (COZAAR) 50 MG tablet Take 1 tablet (50 mg total) by mouth daily. 90 tablet 3  . metFORMIN (GLUCOPHAGE) 500 MG tablet Take 500 mg by mouth 2 (two) times daily with a meal.      . Multiple Vitamins-Minerals (MEGA MULTIVITAMIN FOR MEN PO) Take 1 tablet by mouth daily.     . Olodaterol HCl (STRIVERDI RESPIMAT) 2.5 MCG/ACT AERS Inhale 1 puff into the lungs daily.    . OXYGEN Inhale 2 L into the lungs continuous.    . predniSONE (DELTASONE) 20 MG tablet Take 3 tablets (60 mg total) by mouth daily with breakfast. 100 tablet 0  . Semaglutide (OZEMPIC) 1 MG/DOSE SOPN Inject 1 mg into the skin every Friday.     . tamsulosin (FLOMAX) 0.4 MG CAPS capsule Take 1 capsule (0.4 mg total) by mouth at bedtime. 30 capsule 0  . tiotropium (SPIRIVA HANDIHALER) 18 MCG inhalation capsule Place 1 capsule (18 mcg total) into inhaler and inhale daily. 30 capsule 4   No current facility-administered medications for this visit.    PHYSICAL EXAMINATION: ECOG PERFORMANCE STATUS: 1 - Symptomatic but completely ambulatory  Vitals:   08/16/19 1146  BP: (!) 143/94  Pulse: (!) 56  Resp: 18  Temp: 98.2 F (36.8 C)  SpO2: 98%   Filed Weights   08/16/19 1146  Weight: 209 lb 9.6 oz (95.1 kg)    LABORATORY DATA:  I have reviewed the data as listed CMP Latest Ref Rng & Units 06/28/2019 03/15/2019 09/23/2018  Glucose 65 - 99 mg/dL 962(X) 528(U) 132(G)  BUN 8 - 27 mg/dL 18 40(N) 27  Creatinine 0.76 - 1.27 mg/dL 0.27(O) 5.36(U) 4.40  Sodium 134 - 144 mmol/L 140 135 142  Potassium 3.5 -  5.2 mmol/L 5.2 4.4 4.5  Chloride 96 - 106 mmol/L 102 101 102  CO2 20 - 29 mmol/L 25 23 24   Calcium 8.6 - 10.2 mg/dL 9.1 9.3 9.3  Total Protein 6.5 - 8.1 g/dL - - -  Total Bilirubin 0.3 - 1.2 mg/dL - - -  Alkaline Phos 38 - 126 U/L - - -  AST 15 - 41 U/L - - -  ALT 0 - 44 U/L - - -    Lab Results  Component Value Date   WBC 7.7 08/16/2019   HGB 10.8 (L) 08/16/2019   HCT 33.9 (L) 08/16/2019   MCV 87.6 08/16/2019   PLT 81 (L) 08/16/2019   NEUTROABS 6.5 08/16/2019    ASSESSMENT & PLAN:  Acute ITP ([place]) 02/18/2018: Hemoglobin 8.6, MCV 83, WBC 3.5, platelets 86 creatinine 1.83 07/15/2018: Hemoglobin  10.6, platelets 63, WBC 4.3 01/03/2019: Hemoglobin 11.3, platelets 49, WBC 4.4, normal differential 07/05/2019:Hemoglobin 11.1, platelets 35  Current treatment: Prednisone 60 mg daily started 07/05/2019, dose being tapered since 07/19/2019 Lab review:   08/16/2019: Platelets 81, Hb 10.8 gm Take prednisone 20 mg X 2 weeks and then 10 mg after that  Return to clinic in 4 weeks for follow-up with labs  No orders of the defined types were placed in this encounter.  The patient has a good understanding of the overall plan. he agrees with it. he will call with any problems that may develop before the next visit here.  Total time spent: 15 mins including face to face time and time spent for planning, charting and coordination of care  Nicholas Lose, MD 08/16/2019  I, [name], am acting as scribe for Dr. Nicholas Lose.  I have reviewed the above documentation for accuracy and completeness, and I agree with the above.

## 2019-08-16 ENCOUNTER — Inpatient Hospital Stay: Payer: Medicare PPO | Attending: Hematology and Oncology

## 2019-08-16 ENCOUNTER — Other Ambulatory Visit: Payer: Self-pay

## 2019-08-16 ENCOUNTER — Inpatient Hospital Stay: Payer: Medicare PPO | Admitting: Hematology and Oncology

## 2019-08-16 DIAGNOSIS — Z794 Long term (current) use of insulin: Secondary | ICD-10-CM | POA: Diagnosis not present

## 2019-08-16 DIAGNOSIS — D693 Immune thrombocytopenic purpura: Secondary | ICD-10-CM | POA: Diagnosis not present

## 2019-08-16 DIAGNOSIS — Z7901 Long term (current) use of anticoagulants: Secondary | ICD-10-CM | POA: Diagnosis not present

## 2019-08-16 DIAGNOSIS — Z79899 Other long term (current) drug therapy: Secondary | ICD-10-CM | POA: Insufficient documentation

## 2019-08-16 DIAGNOSIS — D696 Thrombocytopenia, unspecified: Secondary | ICD-10-CM | POA: Diagnosis present

## 2019-08-16 LAB — CBC WITH DIFFERENTIAL (CANCER CENTER ONLY)
Abs Immature Granulocytes: 0.06 10*3/uL (ref 0.00–0.07)
Basophils Absolute: 0 10*3/uL (ref 0.0–0.1)
Basophils Relative: 0 %
Eosinophils Absolute: 0.1 10*3/uL (ref 0.0–0.5)
Eosinophils Relative: 1 %
HCT: 33.9 % — ABNORMAL LOW (ref 39.0–52.0)
Hemoglobin: 10.8 g/dL — ABNORMAL LOW (ref 13.0–17.0)
Immature Granulocytes: 1 %
Lymphocytes Relative: 9 %
Lymphs Abs: 0.7 10*3/uL (ref 0.7–4.0)
MCH: 27.9 pg (ref 26.0–34.0)
MCHC: 31.9 g/dL (ref 30.0–36.0)
MCV: 87.6 fL (ref 80.0–100.0)
Monocytes Absolute: 0.4 10*3/uL (ref 0.1–1.0)
Monocytes Relative: 5 %
Neutro Abs: 6.5 10*3/uL (ref 1.7–7.7)
Neutrophils Relative %: 84 %
Platelet Count: 81 10*3/uL — ABNORMAL LOW (ref 150–400)
RBC: 3.87 MIL/uL — ABNORMAL LOW (ref 4.22–5.81)
RDW: 14.9 % (ref 11.5–15.5)
WBC Count: 7.7 10*3/uL (ref 4.0–10.5)
nRBC: 0 % (ref 0.0–0.2)

## 2019-08-16 NOTE — Assessment & Plan Note (Signed)
02/18/2018: Hemoglobin 8.6, MCV 83, WBC 3.5, platelets 86 creatinine 1.83 07/15/2018: Hemoglobin 10.6, platelets 63, WBC 4.3 01/03/2019: Hemoglobin 11.3, platelets 49, WBC 4.4, normal differential 07/05/2019:Hemoglobin 11.1, platelets 35  Current treatment: Prednisone 60 mg daily started 07/05/2019, dose being tapered since 07/19/2019 Lab review:   08/16/2019:  Return to clinic in 4 weeks for follow-up with labs

## 2019-08-16 NOTE — Telephone Encounter (Signed)
[id number] Injection(s), anesthetic agent and/or st more  Notification/Prior Authorization not required if procedure performed in Office; otherwise may be required for this service

## 2019-08-17 ENCOUNTER — Telehealth: Payer: Self-pay | Admitting: Hematology and Oncology

## 2019-08-17 DIAGNOSIS — J449 Chronic obstructive pulmonary disease, unspecified: Secondary | ICD-10-CM | POA: Diagnosis not present

## 2019-08-17 NOTE — Telephone Encounter (Signed)
I talk with patient regarding schedule  

## 2019-08-22 DIAGNOSIS — G4733 Obstructive sleep apnea (adult) (pediatric): Secondary | ICD-10-CM | POA: Diagnosis not present

## 2019-08-22 DIAGNOSIS — E860 Dehydration: Secondary | ICD-10-CM | POA: Diagnosis not present

## 2019-08-22 DIAGNOSIS — J8410 Pulmonary fibrosis, unspecified: Secondary | ICD-10-CM | POA: Diagnosis not present

## 2019-08-22 DIAGNOSIS — J449 Chronic obstructive pulmonary disease, unspecified: Secondary | ICD-10-CM | POA: Diagnosis not present

## 2019-08-23 ENCOUNTER — Telehealth: Payer: Self-pay | Admitting: Pulmonary Disease

## 2019-08-23 NOTE — Telephone Encounter (Signed)
I tried to get the Ct approved but had to fax records to try and get the approval to CT

## 2019-08-24 ENCOUNTER — Ambulatory Visit (HOSPITAL_BASED_OUTPATIENT_CLINIC_OR_DEPARTMENT_OTHER): Payer: Medicare PPO

## 2019-08-24 NOTE — Telephone Encounter (Signed)
Patient states would like to reschedule the CT scan.  Patient has [employer] for insurance.  Patient phone number is [phone].

## 2019-08-25 ENCOUNTER — Ambulatory Visit: Payer: Medicare Other | Admitting: Pulmonary Disease

## 2019-08-25 ENCOUNTER — Other Ambulatory Visit: Payer: Self-pay

## 2019-08-25 ENCOUNTER — Encounter: Payer: Self-pay | Admitting: Physical Medicine and Rehabilitation

## 2019-08-25 ENCOUNTER — Ambulatory Visit: Payer: Self-pay

## 2019-08-25 ENCOUNTER — Ambulatory Visit: Payer: Medicare PPO | Admitting: Physical Medicine and Rehabilitation

## 2019-08-25 VITALS — BP 151/84 | HR 59

## 2019-08-25 DIAGNOSIS — M5416 Radiculopathy, lumbar region: Secondary | ICD-10-CM | POA: Diagnosis not present

## 2019-08-25 DIAGNOSIS — M961 Postlaminectomy syndrome, not elsewhere classified: Secondary | ICD-10-CM

## 2019-08-25 MED ORDER — METHYLPREDNISOLONE ACETATE 80 MG/ML IJ SUSP: 40.0000 mg | Freq: Once | INTRAMUSCULAR | Status: DC

## 2019-08-25 NOTE — Telephone Encounter (Signed)
I have called the patient to try and get the CT and ROV schedule. The wife stated he wasn't available right this moment and would have him call back to schedule the appts

## 2019-08-25 NOTE — Progress Notes (Signed)
 .  Numeric Pain Rating Scale and Functional Assessment Average Pain 6   In the last MONTH (on 0-10 scale) has pain interfered with the following?  1. General activity like being  able to carry out your everyday physical activities such as walking, climbing stairs, carrying groceries, or moving a chair?  Rating(6)   +Driver, +BT(eliquis, ok for inj), -Dye Allergies.  

## 2019-08-27 DIAGNOSIS — J8410 Pulmonary fibrosis, unspecified: Secondary | ICD-10-CM | POA: Diagnosis not present

## 2019-08-27 DIAGNOSIS — E860 Dehydration: Secondary | ICD-10-CM | POA: Diagnosis not present

## 2019-08-27 DIAGNOSIS — G4733 Obstructive sleep apnea (adult) (pediatric): Secondary | ICD-10-CM | POA: Diagnosis not present

## 2019-08-27 DIAGNOSIS — J449 Chronic obstructive pulmonary disease, unspecified: Secondary | ICD-10-CM | POA: Diagnosis not present

## 2019-09-01 ENCOUNTER — Telehealth: Payer: Self-pay | Admitting: Pulmonary Disease

## 2019-09-01 NOTE — Telephone Encounter (Signed)
Opened in error

## 2019-09-02 NOTE — Telephone Encounter (Signed)
CT has now been rescheduled on 02/16 @ 9:00am along with his ROV with Tammy Parrett on 09/16/2019 @ 9:30am Patient is aware of these new appts

## 2019-09-02 NOTE — Telephone Encounter (Signed)
Has anyone gotten this scheduled for the pt yet?

## 2019-09-12 NOTE — Progress Notes (Signed)
Patient Care Team: [name], MD as PCP - General (Internal Medicine) Clent Jacks, MD as Consulting Physician (Ophthalmology) Brand Males, MD as Consulting Physician (Pulmonary Disease) System, Provider Not In as Consulting Physician Lahoma Rocker, MD as Consulting Physician (Rheumatology) Rigoberto Noel, MD as Consulting Physician (Pulmonary Disease) Lelon Perla, MD as Consulting Physician (Cardiology)  DIAGNOSIS:    ICD-10-CM   1. Anemia due to stage 3b chronic kidney disease  N18.32    D63.1   2. 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 with above-mentioned history of severe anemia due to chronic kidney diseaseand thrombocytopeniafor which he is currently on prednisone.He presents to the clinic todayfor follow-up.  ALLERGIES:  has No Known Allergies.  MEDICATIONS:  Current Outpatient Medications  Medication Sig Dispense Refill  . albuterol (PROVENTIL) (2.5 MG/3ML) 0.083% nebulizer solution 1 vial in nebulizer every 6 hours as needed Dx 496 120 mL 6  . ALPRAZolam (XANAX) 0.5 MG tablet Take 0.5 mg by mouth at bedtime.     [name] apixaban (ELIQUIS) 5 MG TABS tablet Take 1 tablet (5 mg total) by mouth 2 (two) times daily. 180 tablet 2  . atorvastatin (LIPITOR) 40 MG tablet Take 1 tablet (40 mg total) by mouth daily. 90 tablet 3  . Carboxymethylcellul-Glycerin (LUBRICATING EYE DROPS OP) Place 1 drop into both eyes 4 (four) times daily as needed (dry eyes).     . Cholecalciferol (VITAMIN D3) 2000 units capsule Take 2,000 Units by mouth daily.     . insulin glargine (LANTUS) 100 UNIT/ML injection Inject 50 Units into the skin at bedtime.     [name] levothyroxine (SYNTHROID) 125 MCG tablet Take 125 mcg by mouth daily before breakfast.     . losartan (COZAAR) 50 MG tablet Take 1 tablet (50 mg total) by mouth daily. 90 tablet 3  . metFORMIN (GLUCOPHAGE) 500 MG  tablet Take 500 mg by mouth 2 (two) times daily with a meal.    . Multiple Vitamins-Minerals (MEGA MULTIVITAMIN FOR MEN PO) Take 1 tablet by mouth daily.     . Olodaterol HCl (STRIVERDI RESPIMAT) 2.5 MCG/ACT AERS Inhale 1 puff into the lungs daily.    . OXYGEN Inhale 2 L into the lungs continuous.    . predniSONE (DELTASONE) 20 MG tablet Take 3 tablets (60 mg total) by mouth daily with breakfast. 100 tablet 0  . Semaglutide (OZEMPIC) 1 MG/DOSE SOPN Inject 1 mg into the skin every Friday.     . tamsulosin (FLOMAX) 0.4 MG CAPS capsule Take 1 capsule (0.4 mg total) by mouth at bedtime. 30 capsule 0  . tiotropium (SPIRIVA HANDIHALER) 18 MCG inhalation capsule Place 1 capsule (18 mcg total) into inhaler and inhale daily. 30 capsule 4   Current Facility-Administered Medications  Medication Dose Route Frequency Provider Last Rate Last Admin  . methylPREDNISolone acetate (DEPO-MEDROL) injection 40 mg  40 mg Other Once Magnus Sinning, MD        PHYSICAL EXAMINATION: ECOG PERFORMANCE STATUS: 1 - Symptomatic but completely ambulatory  Vitals:   09/13/19 1056  BP: 136/81  Pulse: (!) 110  Resp: 18  Temp: 98.3 F (36.8 C)  SpO2: 92%   Filed Weights   09/13/19 1056  Weight: 214 lb 11.2 oz (97.4 kg)    LABORATORY DATA:  I have reviewed the data as listed CMP Latest Ref Rng & Units 06/28/2019 03/15/2019 09/23/2018  Glucose  65 - 99 mg/dL 588(T) 254(D) 826(E)  BUN 8 - 27 mg/dL 18 15(A) 27  Creatinine 0.76 - 1.27 mg/dL 3.09(M) 0.76(K) 0.88  Sodium 134 - 144 mmol/L 140 135 142  Potassium 3.5 - 5.2 mmol/L 5.2 4.4 4.5  Chloride 96 - 106 mmol/L 102 101 102  CO2 20 - 29 mmol/L 25 23 24   Calcium 8.6 - 10.2 mg/dL 9.1 9.3 9.3  Total Protein 6.5 - 8.1 g/dL - - -  Total Bilirubin 0.3 - 1.2 mg/dL - - -  Alkaline Phos 38 - 126 U/L - - -  AST 15 - 41 U/L - - -  ALT 0 - 44 U/L - - -    Lab Results  Component Value Date   WBC 7.7 09/13/2019   HGB 11.0 (L) 09/13/2019   HCT 34.3 (L) 09/13/2019   MCV  89.1 09/13/2019   PLT 79 (L) 09/13/2019   NEUTROABS 4.6 09/13/2019    ASSESSMENT & PLAN:  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 We discussed that if he does have relapse we can talk about alternatives to steroids. Return to clinic in 1 month with labs and follow-up.    No orders of the defined types were placed in this encounter.  The patient has a good understanding of the overall plan. he agrees with it. he will call with any problems that may develop before the next visit here.  Total time spent: 20 mins including face to face time and time spent for planning, charting and coordination of care  09/15/2019, MD 09/13/2019  I, 09/15/2019 [place], am acting as scribe for Dr. Kirt Boys.  I have reviewed the above documentation for accuracy and completeness, and I agree with the above.

## 2019-09-13 ENCOUNTER — Inpatient Hospital Stay (HOSPITAL_BASED_OUTPATIENT_CLINIC_OR_DEPARTMENT_OTHER): Payer: Medicare PPO | Admitting: Hematology and Oncology

## 2019-09-13 ENCOUNTER — Inpatient Hospital Stay: Payer: Medicare PPO | Attending: Hematology and Oncology

## 2019-09-13 ENCOUNTER — Other Ambulatory Visit: Payer: Self-pay

## 2019-09-13 ENCOUNTER — Telehealth: Payer: Self-pay | Admitting: Hematology and Oncology

## 2019-09-13 ENCOUNTER — Ambulatory Visit (HOSPITAL_BASED_OUTPATIENT_CLINIC_OR_DEPARTMENT_OTHER)
Admission: RE | Admit: 2019-09-13 | Discharge: 2019-09-13 | Disposition: A | Discharge status: Home / Self Care | Payer: Medicare PPO | Source: Ambulatory Visit | Attending: Adult Health | Admitting: Adult Health

## 2019-09-13 DIAGNOSIS — Z794 Long term (current) use of insulin: Secondary | ICD-10-CM | POA: Insufficient documentation

## 2019-09-13 DIAGNOSIS — N1832 Chronic kidney disease, stage 3b: Secondary | ICD-10-CM | POA: Diagnosis not present

## 2019-09-13 DIAGNOSIS — D693 Immune thrombocytopenic purpura: Secondary | ICD-10-CM | POA: Diagnosis not present

## 2019-09-13 DIAGNOSIS — Z79899 Other long term (current) drug therapy: Secondary | ICD-10-CM | POA: Insufficient documentation

## 2019-09-13 DIAGNOSIS — Z7901 Long term (current) use of anticoagulants: Secondary | ICD-10-CM | POA: Diagnosis not present

## 2019-09-13 DIAGNOSIS — D631 Anemia in chronic kidney disease: Secondary | ICD-10-CM

## 2019-09-13 DIAGNOSIS — R911 Solitary pulmonary nodule: Secondary | ICD-10-CM | POA: Insufficient documentation

## 2019-09-13 DIAGNOSIS — N189 Chronic kidney disease, unspecified: Secondary | ICD-10-CM | POA: Insufficient documentation

## 2019-09-13 DIAGNOSIS — R918 Other nonspecific abnormal finding of lung field: Secondary | ICD-10-CM | POA: Diagnosis not present

## 2019-09-13 LAB — CBC WITH DIFFERENTIAL (CANCER CENTER ONLY)
Abs Immature Granulocytes: 0.05 10*3/uL (ref 0.00–0.07)
Basophils Absolute: 0 10*3/uL (ref 0.0–0.1)
Basophils Relative: 1 %
Eosinophils Absolute: 0.2 10*3/uL (ref 0.0–0.5)
Eosinophils Relative: 2 %
HCT: 34.3 % — ABNORMAL LOW (ref 39.0–52.0)
Hemoglobin: 11 g/dL — ABNORMAL LOW (ref 13.0–17.0)
Immature Granulocytes: 1 %
Lymphocytes Relative: 28 %
Lymphs Abs: 2.2 10*3/uL (ref 0.7–4.0)
MCH: 28.6 pg (ref 26.0–34.0)
MCHC: 32.1 g/dL (ref 30.0–36.0)
MCV: 89.1 fL (ref 80.0–100.0)
Monocytes Absolute: 0.7 10*3/uL (ref 0.1–1.0)
Monocytes Relative: 9 %
Neutro Abs: 4.6 10*3/uL (ref 1.7–7.7)
Neutrophils Relative %: 59 %
Platelet Count: 79 10*3/uL — ABNORMAL LOW (ref 150–400)
RBC: 3.85 MIL/uL — ABNORMAL LOW (ref 4.22–5.81)
RDW: 16.1 % — ABNORMAL HIGH (ref 11.5–15.5)
WBC Count: 7.7 10*3/uL (ref 4.0–10.5)
nRBC: 0 % (ref 0.0–0.2)

## 2019-09-13 NOTE — Telephone Encounter (Signed)
I talk with patient regarding schedule  

## 2019-09-13 NOTE — Assessment & Plan Note (Signed)
02/18/2018: Hemoglobin 8.6, MCV 83, WBC 3.5, platelets 86 creatinine 1.83 07/15/2018: Hemoglobin 10.6, platelets 63, WBC 4.3 01/03/2019: Hemoglobin 11.3, platelets 49, WBC 4.4, normal differential 07/05/2019:Hemoglobin 11.1, platelets 35  Current treatment: Prednisone 60 mg daily started 07/05/2019, dose being tapered since 07/19/2019 Lab review:   08/16/2019: Platelets 81, Hb 10.8 gm 09/13/2019:  Based on the above results I instructed the patient that he can stop prednisone at this time. Return to clinic in 1 month with labs and follow-up.

## 2019-09-16 ENCOUNTER — Encounter: Payer: Self-pay | Admitting: Adult Health

## 2019-09-16 ENCOUNTER — Ambulatory Visit (INDEPENDENT_AMBULATORY_CARE_PROVIDER_SITE_OTHER): Payer: Medicare PPO | Admitting: Adult Health

## 2019-09-16 DIAGNOSIS — R911 Solitary pulmonary nodule: Secondary | ICD-10-CM | POA: Diagnosis not present

## 2019-09-16 DIAGNOSIS — R918 Other nonspecific abnormal finding of lung field: Secondary | ICD-10-CM | POA: Diagnosis not present

## 2019-09-16 DIAGNOSIS — J441 Chronic obstructive pulmonary disease with (acute) exacerbation: Secondary | ICD-10-CM | POA: Diagnosis not present

## 2019-09-16 DIAGNOSIS — G4733 Obstructive sleep apnea (adult) (pediatric): Secondary | ICD-10-CM

## 2019-09-16 DIAGNOSIS — Z9989 Dependence on other enabling machines and devices: Secondary | ICD-10-CM

## 2019-09-16 DIAGNOSIS — J9611 Chronic respiratory failure with hypoxia: Secondary | ICD-10-CM

## 2019-09-16 NOTE — Patient Instructions (Signed)
Continue on Spiriva and Striverdi daily , rinse after use .  Continue on Oxygen 2l/m Continue on CPAP At bedtime  With oxygen .  Wear each night for at least 6 hr each night .  Plan for CT chest in 1 year     Follow up in 4 months with Nicola Heinemann NP or Dr. Vassie Loll Please contact office for sooner follow up if symptoms do not improve or worsen or seek emergency care

## 2019-09-16 NOTE — Progress Notes (Signed)
Virtual Visit via Telephone Note  I connected with [name] on 09/16/19 at  9:30 AM EST by telephone and verified that I am speaking with the correct person using two identifiers.  Location: Patient: Home  Provider: Home    I discussed the limitations, risks, security and privacy concerns of performing an evaluation and management service by telephone and the availability of in person appointments. I also discussed with the patient that there may be a patient responsible charge related to this service. The patient expressed understanding and agreed to proceed.   History of Present Illness: 79 year old male former smoker followed for severe COPD, chronic respiratory failure on oxygen at 2 L.  Patient has obstructive sleep apnea on nocturnal CPAP.  Patient also has known lung nodules that are followed serially on CT chest. Medical history is significant for rheumatoid arthritis (no maintenance medications) diabetes, congestive heart failure (diagnosed 2018 with echo showing EF at 79 to 40%), coronary artery disease status post RCA stent and February 2018, chronic kidney disease, A. fib (previously on amiodarone stopped in August 2019)-on Eliquis Anemia and thrombocytopenia followed by hematology Gets medications through the [place] system (was in the military-trained recruits)  Today's televisit is a 62-month follow-up for COPD, oxygen dependent respiratory failure and obstructive sleep apnea.  Patient says overall his breathing has been doing okay.  Says it has been a very stressful few weeks.  His wife recently died.  She has been a long-term patient of ours that has severe COPD and has been on hospice.  Support was provided.  He denies any flare of cough or wheezing.  He remains on Spiriva and Striverdi  He is on oxygen at 2 L.  Denies any increased oxygen demands  Has known lung nodules that have been followed serially on CT chest.  CT was done on September 13, 2019 showed a slightly  decreased cavitary left apical nodule measuring 1.0 cm.  Irregular subsolid left lower lobe pulmonary nodules are stable largest at 1.8 cm.  There were no new or enlarging pulmonary nodules.  We discussed a follow-up CT chest in 1 year.  Patient is followed by cardiology for known coronary artery disease and congestive heart failure.  Patient had a syncopal episode in fall 2020.  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  And underwent a pacemaker implantation. Says he has had no further episodes of syncope.  Patient is following with oncology for thrombocytopenia.  He has severe anemia, chronic kidney disease.  He had worsening thrombocytopenia in December 2020.  He was started on slow prednisone taper by oncology.  Currently on 5 mg of prednisone.  Platelets have improved and are currently at 79,000.  Patient denies any known bleeding   Patient Active Problem List   Diagnosis Date Noted  . 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 Lung nodules 03/25/2019  . Acute ITP ([place]) 01/22/2018  . Symptomatic anemia 01/20/2018  . Chronic respiratory failure with hypoxia ([place]) 12/01/2017  . Lung mass 12/01/2017  . PAF (paroxysmal atrial fibrillation) ([place])   . Ischemic cardiomyopathy 09/02/2017  . Chronic systolic CHF (congestive heart failure) ([place]) 09/02/2017  . Aortic aneurysm ([place]) 09/02/2017  . Diabetes mellitus with complication, with long-term current use of insulin ([place]) 09/01/2017  . Anxiety and depression 09/01/2017  . COPD (chronic obstructive pulmonary disease) ([place]) 09/01/2017  . Hyperlipidemia 01/05/2014  . OSA on CPAP 06/10/2011  . Coronary atherosclerosis 11/23/2009  . Hypothyroidism 07/30/2009  .  Essential hypertension 07/30/2009    Current Outpatient Medications on File Prior to Visit  Medication Sig Dispense Refill  . albuterol (PROVENTIL) (2.5 MG/3ML) 0.083% nebulizer solution 1 vial in nebulizer every 6 hours as needed Dx 496 120 mL 6  .  ALPRAZolam (XANAX) 0.5 MG tablet Take 0.5 mg by mouth at bedtime.     [name] apixaban (ELIQUIS) 5 MG TABS tablet Take 1 tablet (5 mg total) by mouth 2 (two) times daily. 180 tablet 2  . atorvastatin (LIPITOR) 40 MG tablet Take 1 tablet (40 mg total) by mouth daily. 90 tablet 3  . Carboxymethylcellul-Glycerin (LUBRICATING EYE DROPS OP) Place 1 drop into both eyes 4 (four) times daily as needed (dry eyes).     . Cholecalciferol (VITAMIN D3) 2000 units capsule Take 2,000 Units by mouth daily.     . insulin glargine (LANTUS) 100 UNIT/ML injection Inject 50 Units into the skin at bedtime.     [name] levothyroxine (SYNTHROID) 125 MCG tablet Take 125 mcg by mouth daily before breakfast.     . losartan (COZAAR) 50 MG tablet Take 1 tablet (50 mg total) by mouth daily. 90 tablet 3  . metFORMIN (GLUCOPHAGE) 500 MG tablet Take 500 mg by mouth 2 (two) times daily with a meal.    . Multiple Vitamins-Minerals (MEGA MULTIVITAMIN FOR MEN PO) Take 1 tablet by mouth daily.     . Olodaterol HCl (STRIVERDI RESPIMAT) 2.5 MCG/ACT AERS Inhale 1 puff into the lungs daily.    . OXYGEN Inhale 2 L into the lungs continuous.    . predniSONE (DELTASONE) 20 MG tablet Take 3 tablets (60 mg total) by mouth daily with breakfast. 100 tablet 0  . Semaglutide (OZEMPIC) 1 MG/DOSE SOPN Inject 1 mg into the skin every Friday.     . tamsulosin (FLOMAX) 0.4 MG CAPS capsule Take 1 capsule (0.4 mg total) by mouth at bedtime. 30 capsule 0  . tiotropium (SPIRIVA HANDIHALER) 18 MCG inhalation capsule Place 1 capsule (18 mcg total) into inhaler and inhale daily. 30 capsule 4   Current Facility-Administered Medications on File Prior to Visit  Medication Dose Route Frequency Provider Last Rate Last Admin  . 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  PFT 10/2015  FEV1 was 36%, ratio 46, FVC 58%, DLCO 26% Echo 08/2016 >EF 35-40%, s/p stent , Echo 08/2017 40-45%, gr 2 DD CXR 11/2016 LUL PNA ,  CT chest 12/2016 -masslike consolidation in LUL  CT chest 03/12/17 >stable masslike consolidation LUL 3.9cm , LUL 7 mm nodule  PET scan 03/20/17 >+metabolic act in LUL consolidation CT chest February 2019>nodular left-sided airspace opacities have improved from prior study. CT angio chest January 25, 2018>advanced emphysema, scarring of the left upper lobe, nodules of the left lower lobe are unchanged. Scattered scarring in the right lung is unchanged. CT chest September 2019>several pulmonary nodules scattered throughout the left lung stable since February 2019 decreased from 05/2017 most compatible with stable postinflammatory nodularity with recommendations for follow-up CT chest in 12 months.,Thick parenchymal band in the anterior left upper lobe slightly decreased in thickness, stable right fibrothorax with extensive basilar pleural-parenchymal scarring in the right lung, severe emphysema  08/2017 admitted with Sepsis [name]  12/2017 Admitted with GIB , anemia 02/2018 Hematology following chronic anemia  Assessment and Plan: COPD currently  stable-  Oxygen dependent respiratory failure-continue on O2  Obstructive sleep apnea with excellent compliance and control on CPAP  Lung nodules-stable on CT chest  Anemia, thrombocytopenia-continue follow-up with hematology  Plan  Patient Instructions  Continue on Spiriva and Striverdi daily , rinse after use .  Continue on Oxygen 2l/m Continue on CPAP At bedtime  With oxygen .  Wear each night for at least 6 hr each night .  Plan for CT chest in 1 year     Follow up in 4 months with Brianne Maina NP or Dr. Vassie Loll Please contact office for sooner follow up if symptoms do not improve or worsen or seek emergency care       Follow Up Instructions: Follow-up in 4 months and as needed   I discussed the assessment and  treatment plan with the patient. The patient was provided an opportunity to ask questions and all were answered. The patient agreed with the plan and demonstrated an understanding of the instructions.   The patient was advised to call back or seek an in-person evaluation if the symptoms worsen or if the condition fails to improve as anticipated.  I provided  26 minutes of non-face-to-face time during this encounter.   Rubye Oaks, NP

## 2019-09-17 DIAGNOSIS — J449 Chronic obstructive pulmonary disease, unspecified: Secondary | ICD-10-CM | POA: Diagnosis not present

## 2019-09-20 ENCOUNTER — Telehealth: Payer: Self-pay | Admitting: Adult Health

## 2019-09-20 NOTE — Addendum Note (Signed)
Addended by: [name] E on: 09/20/2019 01:01 PM   Modules accepted: Orders

## 2019-09-20 NOTE — Telephone Encounter (Signed)
Patient had televisit with TP on Friday 2.19.21 with recs to follow up in 4 months with TP or Al[place]  LM[facility] TCB x1 to schedule appt

## 2019-09-22 DIAGNOSIS — E860 Dehydration: Secondary | ICD-10-CM | POA: Diagnosis not present

## 2019-09-22 DIAGNOSIS — J8410 Pulmonary fibrosis, unspecified: Secondary | ICD-10-CM | POA: Diagnosis not present

## 2019-09-22 DIAGNOSIS — G4733 Obstructive sleep apnea (adult) (pediatric): Secondary | ICD-10-CM | POA: Diagnosis not present

## 2019-09-22 DIAGNOSIS — J449 Chronic obstructive pulmonary disease, unspecified: Secondary | ICD-10-CM | POA: Diagnosis not present

## 2019-09-25 DIAGNOSIS — J8410 Pulmonary fibrosis, unspecified: Secondary | ICD-10-CM | POA: Diagnosis not present

## 2019-09-25 DIAGNOSIS — J449 Chronic obstructive pulmonary disease, unspecified: Secondary | ICD-10-CM | POA: Diagnosis not present

## 2019-09-25 DIAGNOSIS — E860 Dehydration: Secondary | ICD-10-CM | POA: Diagnosis not present

## 2019-09-25 DIAGNOSIS — G4733 Obstructive sleep apnea (adult) (pediatric): Secondary | ICD-10-CM | POA: Diagnosis not present

## 2019-09-30 ENCOUNTER — Ambulatory Visit (INDEPENDENT_AMBULATORY_CARE_PROVIDER_SITE_OTHER): Payer: Medicare PPO | Admitting: *Deleted

## 2019-09-30 DIAGNOSIS — I480 Paroxysmal atrial fibrillation: Secondary | ICD-10-CM | POA: Diagnosis not present

## 2019-10-01 LAB — CUP PACEART REMOTE DEVICE CHECK
Battery Remaining Longevity: 167 mo
Battery Voltage: 3.21 V
Brady Statistic AP VP Percent: 4.2 %
Brady Statistic AP VS Percent: 5.65 %
Brady Statistic AS VP Percent: 0.09 %
Brady Statistic AS VS Percent: 90.06 %
Brady Statistic RA Percent Paced: 10.12 %
Brady Statistic RV Percent Paced: 4.29 %
Date Time Interrogation Session: 20210304181200
Implantable Lead Implant Date: 20201204
Implantable Lead Implant Date: 20201204
Implantable Lead Location: 753859
Implantable Lead Location: 753860
Implantable Lead Model: 5076
Implantable Lead Model: 5076
Implantable Pulse Generator Implant Date: 20201204
Lead Channel Impedance Value: 342 Ohm
Lead Channel Impedance Value: 361 Ohm
Lead Channel Impedance Value: 399 Ohm
Lead Channel Impedance Value: 418 Ohm
Lead Channel Pacing Threshold Amplitude: 0.375 V
Lead Channel Pacing Threshold Amplitude: 0.5 V
Lead Channel Pacing Threshold Pulse Width: 0.4 ms
Lead Channel Pacing Threshold Pulse Width: 0.4 ms
Lead Channel Sensing Intrinsic Amplitude: 11.5 mV
Lead Channel Sensing Intrinsic Amplitude: 11.5 mV
Lead Channel Sensing Intrinsic Amplitude: 2.875 mV
Lead Channel Sensing Intrinsic Amplitude: 2.875 mV
Lead Channel Setting Pacing Amplitude: 3.5 V
Lead Channel Setting Pacing Amplitude: 3.5 V
Lead Channel Setting Pacing Pulse Width: 0.4 ms
Lead Channel Setting Sensing Sensitivity: 2 mV

## 2019-10-04 DIAGNOSIS — I447 Left bundle-branch block, unspecified: Secondary | ICD-10-CM | POA: Insufficient documentation

## 2019-10-04 DIAGNOSIS — R55 Syncope and collapse: Secondary | ICD-10-CM | POA: Insufficient documentation

## 2019-10-04 DIAGNOSIS — Z950 Presence of cardiac pacemaker: Secondary | ICD-10-CM | POA: Insufficient documentation

## 2019-10-06 ENCOUNTER — Telehealth (INDEPENDENT_AMBULATORY_CARE_PROVIDER_SITE_OTHER): Payer: Medicare PPO | Admitting: Internal Medicine

## 2019-10-06 ENCOUNTER — Other Ambulatory Visit: Payer: Self-pay

## 2019-10-06 DIAGNOSIS — Z955 Presence of coronary angioplasty implant and graft: Secondary | ICD-10-CM

## 2019-10-06 DIAGNOSIS — I480 Paroxysmal atrial fibrillation: Secondary | ICD-10-CM | POA: Diagnosis not present

## 2019-10-06 DIAGNOSIS — Z950 Presence of cardiac pacemaker: Secondary | ICD-10-CM

## 2019-10-06 DIAGNOSIS — I2510 Atherosclerotic heart disease of native coronary artery without angina pectoris: Secondary | ICD-10-CM | POA: Diagnosis not present

## 2019-10-06 DIAGNOSIS — R55 Syncope and collapse: Secondary | ICD-10-CM | POA: Diagnosis not present

## 2019-10-06 DIAGNOSIS — J449 Chronic obstructive pulmonary disease, unspecified: Secondary | ICD-10-CM

## 2019-10-06 DIAGNOSIS — Z9981 Dependence on supplemental oxygen: Secondary | ICD-10-CM

## 2019-10-06 DIAGNOSIS — I447 Left bundle-branch block, unspecified: Secondary | ICD-10-CM | POA: Diagnosis not present

## 2019-10-06 DIAGNOSIS — D696 Thrombocytopenia, unspecified: Secondary | ICD-10-CM | POA: Diagnosis not present

## 2019-10-06 NOTE — Patient Instructions (Addendum)
Medication Instructions:  Your physician recommends that you continue on your current medications as directed. Please refer to the Current Medication list given to you today.  Labwork: None ordered.  Testing/Procedures: None ordered.  Follow-Up: Your physician wants you to follow-up in: 9 months with Dr Graciela Husbands. You will receive a reminder letter in the mail two months in advance. If you don't receive a letter, please call our office to schedule the follow-up appointment.  Remote monitoring is used to monitor your Pacemaker of ICD from home. This monitoring reduces the number of office visits required to check your device to one time per year. It allows [place] to keep an eye on the functioning of your device to ensure it is working properly. You are scheduled for a device check from home on 12/30/2019.  You may send your transmission at any time that day. If you have a wireless device, the transmission will be sent automatically. After your physician reviews your transmission, you will receive a postcard with your next transmission date.  Any Other Special Instructions Will Be Listed Below (If Applicable).  If you need a refill on your cardiac medications before your next appointment, please call your pharmacy.

## 2019-10-06 NOTE — Progress Notes (Signed)
Electrophysiology TeleHealth Note   Due to national recommendations of social distancing due to COVID 19, an audio/video telehealth visit is felt to be most appropriate for this patient at this time.  See MyChart message from today for the patient's consent to telehealth for [facility].   Date:  10/06/2019   ID:  LUISMANUEL CORMAN, DOB Jun 20, 1941, MRN [id number]  Location: patient's home  Provider location: [address] [place]  Evaluation Performed: Follow-up visit  PCP:  [name], MD  Cardiologist:  BCr Electrophysiologist:  SK   Chief Complaint:  CHF  History of Present Illness:    [name] is a 79 y.o. male who presents via audio/video conferencing for a telehealth visit today.  Since last being seen in our clinic for syncope in the setting of left bundle branch block for which pacing was anticipated but rejected by his insurance company in favor of a loop recorder which subsequently demonstrated a pause for which he received a Medtronic pacemaker the patient reports doing better.  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   Past Medical History:  Diagnosis Date  . 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 Anxiety   . Aortic aneurysm ([place]) 09/02/2017  . BENIGN PROSTATIC HYPERTROPHY 11/23/2009  . Cardiomyopathy ([place]) 08/28/2016  . Chronic systolic CHF (congestive heart failure) ([place]) 09/02/2017  . COPD (chronic obstructive pulmonary disease) ([place])   . CORONARY ARTERY DISEASE 11/23/2009  . DECREASED HEARING,  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  . DEGENERATIVE JOINT DISEASE 11/23/2009  . DEPRESSION 11/23/2009  . FATIGUE 11/23/2009  . GAIT DISTURBANCE 12/10/2009  . HEMOPTYSIS UNSPECIFIED 05/07/2010  . High cholesterol   . HYPERTENSION 07/30/2009  . HYPOTHYROIDISM 07/30/2009  . Ischemic cardiomyopathy 09/02/2017  . 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 On home oxygen therapy    "2-3L; 24/7" (09/10/2016)  . OSA on CPAP   . PTSD (post-traumatic stress disorder) 03/10/2012  . 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 RA (rheumatoid arthritis) ([place]) 06/11/2011   "qwhere" (09/10/2016)  . RESPIRATORY FAILURE, CHRONIC 07/31/2009  . Scleritis of both eyes 03/17/2014  . Thrombocytopenia ([place])   . 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 Type II diabetes mellitus ([place])     Past Surgical History:  Procedure Laterality Date  . 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 ABDOMINAL EXPLORATION SURGERY  02/2004   w/LOA/notes 12/10/2010; small bowel obstruction repair with adhesiolysis   . BACK SURGERY    . CARDIAC CATHETERIZATION     2 heart caths in the past.  One in 2000s showed one ulcerated plaque  Rx medically; Second at [facility] Archie Endo 09/05/2016  . CATARACT EXTRACTION W/ INTRAOCULAR LENS  IMPLANT, BILATERAL Bilateral 2000s  . COLECTOMY     hx of remote ileum resection due to bleeding  . COLONOSCOPY WITH PROPOFOL N/A 01/22/2018   Procedure: COLONOSCOPY WITH PROPOFOL;  Surgeon: Silvano Rusk  E, MD;  Location: [facility] ENDOSCOPY;  Service: Endoscopy;  Laterality: N/A;  . CORONARY ANGIOPLASTY WITH STENT PLACEMENT  09/10/2016  . CORONARY STENT INTERVENTION N/A 09/10/2016   Procedure: Coronary Stent Intervention;  Surgeon: Kathleene Hazel, MD;  Location: [facility] INVASIVE CV LAB;  Service: Cardiovascular;  Laterality: N/A;  Distal RCA 4.0x16 Synergy  . ESOPHAGOGASTRODUODENOSCOPY (EGD) WITH PROPOFOL N/A 01/22/2018   Procedure: ESOPHAGOGASTRODUODENOSCOPY (EGD) WITH PROPOFOL;  Surgeon: Iva Boop, MD;  Location: [facility] ENDOSCOPY;  Service: Endoscopy;   Laterality: N/A;  . FEMORAL EMBOLOECTOMY Left 07/2000   with left leg ischemia; Dr. Hart Rochester, vascular  . GANGLION CYST EXCISION Right    "wrist"; Dr. Teressa Senter  . HOT HEMOSTASIS N/A 01/22/2018   Procedure: HOT HEMOSTASIS (ARGON PLASMA COAGULATION/BICAP);  Surgeon: Iva Boop, MD;  Location: [facility];  Service: Endoscopy;  Laterality: N/A;  . LOOP RECORDER INSERTION N/A 04/25/2019   Procedure: LOOP RECORDER INSERTION;  Surgeon: Duke Salvia, MD;  Location: [facility] INVASIVE CV LAB;  Service: Cardiovascular;  Laterality: N/A;  . LOOP RECORDER REMOVAL N/A 07/01/2019   Procedure: LOOP RECORDER REMOVAL;  Surgeon: Duke Salvia, MD;  Location: [facility] INVASIVE CV LAB;  Service: Cardiovascular;  Laterality: N/A;  . LUMBAR LAMINECTOMY  1972   Dr. Fannie Knee  . PACEMAKER IMPLANT N/A 07/01/2019   Procedure: PACEMAKER IMPLANT;  Surgeon: Duke Salvia, MD;  Location: [facility] INVASIVE CV LAB;  Service: Cardiovascular;  Laterality: N/A;  . RIGHT/LEFT HEART CATH AND CORONARY ANGIOGRAPHY N/A 09/10/2016   Procedure: Right/Left Heart Cath and Coronary Angiography;  Surgeon: Kathleene Hazel, MD;  Location: [facility] INVASIVE CV LAB;  Service: Cardiovascular;  Laterality: N/A;  . TONSILLECTOMY      Current Outpatient Medications  Medication Sig Dispense Refill  . albuterol (PROVENTIL) (2.5 MG/3ML) 0.083% nebulizer solution 1 vial in nebulizer every 6 hours as needed Dx 496 120 mL 6  . ALPRAZolam (XANAX) 0.5 MG tablet Take 0.5 mg by mouth at bedtime.     [name] apixaban (ELIQUIS) 5 MG TABS tablet Take 1 tablet (5 mg total) by mouth 2 (two) times daily. 180 tablet 2  . atorvastatin (LIPITOR) 40 MG tablet Take 1 tablet (40 mg total) by mouth daily. 90 tablet 3  . Carboxymethylcellul-Glycerin (LUBRICATING EYE DROPS OP) Place 1 drop into both eyes 4 (four) times daily as needed (dry eyes).     . Cholecalciferol (VITAMIN D3) 2000 units capsule Take 2,000 Units by mouth daily.     . insulin glargine (LANTUS) 100 UNIT/ML injection  Inject 60 Units into the skin at bedtime.     [name] levothyroxine (SYNTHROID) 125 MCG tablet Take 125 mcg by mouth daily before breakfast.     . losartan (COZAAR) 50 MG tablet Take 1 tablet (50 mg total) by mouth daily. 90 tablet 3  . metFORMIN (GLUCOPHAGE) 500 MG tablet Take 500 mg by mouth 2 (two) times daily with a meal.    . Multiple Vitamins-Minerals (MEGA MULTIVITAMIN FOR MEN PO) Take 1 tablet by mouth daily.     . Olodaterol HCl (STRIVERDI RESPIMAT) 2.5 MCG/ACT AERS Inhale 1 puff into the lungs daily.    . OXYGEN Inhale 2 L into the lungs continuous.    . predniSONE (DELTASONE) 20 MG tablet Take 3 tablets (60 mg total) by mouth daily with breakfast. (Patient taking differently: Take 10 mg by mouth daily with breakfast. ) 100 tablet 0  . Semaglutide (OZEMPIC) 1 MG/DOSE SOPN Inject 1 mg into the skin every Friday.     [name]  tamsulosin (FLOMAX) 0.4 MG CAPS capsule Take 1 capsule (0.4 mg total) by mouth at bedtime. 30 capsule 0  . tiotropium (SPIRIVA HANDIHALER) 18 MCG inhalation capsule Place 1 capsule (18 mcg total) into inhaler and inhale daily. 30 capsule 4   Current Facility-Administered Medications  Medication Dose Route Frequency Provider Last Rate Last Admin  . methylPREDNISolone acetate (DEPO-MEDROL) injection 40 mg  40 mg Other Once Tyrell Antonio, MD        Allergies:   Patient has no known allergies.   Social History:  The patient  reports that he quit smoking about 21 years ago. His smoking use included cigarettes, pipe, and cigars. He has a 100.00 pack-year smoking history. He has never used smokeless tobacco. He reports that he does not drink alcohol or use drugs.   Family History:  The patient's   family history includes Other in his mother.   ROS:  Please see the history of present illness.   All other systems are personally reviewed and negative.    Exam:    Vital Signs:  Ht 6' 2.5" (1.892 m)   Wt 210 lb (95.3 kg)   BMI 26.60 kg/m     Labs/Other Tests and Data  Reviewed:    Recent Labs: 06/28/2019: BUN 18; Creatinine, Ser 1.42; Potassium 5.2; Sodium 140 09/13/2019: Hemoglobin 11.0; Platelet Count 79   Wt Readings from Last 3 Encounters:  10/06/19 210 lb (95.3 kg)  09/13/19 214 lb 11.2 oz (97.4 kg)  08/16/19 209 lb 9.6 oz (95.1 kg)     Other studies personally reviewed: Additional studies/ records that were reviewed today include: As above   Device interrogation from 3/21 >> no afib Vp 4% Ap 10%  ASSESSMENT & PLAN:    Syncope  LBBB 1AVB  CAD Prior stenting  Atrial Fibrillation paroxysmal  COPD O2 dependent   Thrombocytopenia  Pacemaker Medtronic DOI 12/20    No interval syncope  No intercurrent atrial fibrillation or flutter  On Anticoagulation;  No bleeding issues   Dyspnea improved and also fatigue>>?  improvement in hemodynamics related to AV and VV synchrony or maybe placebo but...  Without symptoms of ischemia      COVID 19 screen The patient denies symptoms of COVID 19 at this time.  The importance of social distancing was discussed today.  Follow-up: 78m    Current medicines are reviewed at length with the patient today.   The patient does not have concerns regarding his medicines.  The following changes were made today:  none  Labs/ tests ordered today include:   No orders of the defined types were placed in this encounter.   Future tests ( post COVID )    Patient Risk:  after full review of this patients clinical status, I feel that they are at moderate risk at this time.  Today, I have spent 7 minutes with the patient with telehealth technology discussing the above.  Signed, Sherryl Manges, MD  10/06/2019 5:58 PM     [facility] [address] [place] [address] [phone] (office) [phone] (fax)

## 2019-10-13 NOTE — Progress Notes (Signed)
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 with above-mentioned history of severe anemia due to chronic kidney diseaseand thrombocytopeniafor which he is currently onprednisone.He presents to the clinic todayfor follow-up. He is currently taking 10 mg of prednisone.  He has not noticed any bruising or bleeding excessively.  ALLERGIES:  has No Known Allergies.  MEDICATIONS:  Current Outpatient Medications  Medication Sig Dispense Refill  . albuterol (PROVENTIL) (2.5 MG/3ML) 0.083% nebulizer solution 1 vial in nebulizer every 6 hours as needed Dx 496 120 mL 6  . ALPRAZolam (XANAX) 0.5 MG tablet Take 0.5 mg by mouth at bedtime.     [name] apixaban (ELIQUIS) 5 MG TABS tablet Take 1 tablet (5 mg total) by mouth 2 (two) times daily. 180 tablet 2  . atorvastatin (LIPITOR) 40 MG tablet Take 1 tablet (40 mg total) by mouth daily. 90 tablet 3  . Carboxymethylcellul-Glycerin (LUBRICATING EYE DROPS OP) Place 1 drop into both eyes 4 (four) times daily as needed (dry eyes).     . Cholecalciferol (VITAMIN D3) 2000 units capsule Take 2,000 Units by mouth daily.     . insulin glargine (LANTUS) 100 UNIT/ML injection Inject 60 Units into the skin at bedtime.     [name] levothyroxine (SYNTHROID) 125 MCG tablet Take 125 mcg by mouth daily before breakfast.     . losartan (COZAAR) 50 MG tablet Take 1 tablet (50 mg total) by mouth daily. 90 tablet 3  .  metFORMIN (GLUCOPHAGE) 500 MG tablet Take 500 mg by mouth 2 (two) times daily with a meal.    . Multiple Vitamins-Minerals (MEGA MULTIVITAMIN FOR MEN PO) Take 1 tablet by mouth daily.     . Olodaterol HCl (STRIVERDI RESPIMAT) 2.5 MCG/ACT AERS Inhale 1 puff into the lungs daily.    . OXYGEN Inhale 2 L into the lungs continuous.    . predniSONE (DELTASONE) 20 MG tablet Take 3 tablets (60 mg total) by mouth daily with breakfast. (Patient taking differently: Take 10 mg by mouth daily with breakfast. ) 100 tablet 0  . Semaglutide (OZEMPIC) 1 MG/DOSE SOPN Inject 1 mg into the skin every Friday.     . tamsulosin (FLOMAX) 0.4 MG CAPS capsule Take 1 capsule (0.4 mg total) by mouth at bedtime. 30 capsule 0  . tiotropium (SPIRIVA HANDIHALER) 18 MCG inhalation capsule Place 1 capsule (18 mcg total) into inhaler and inhale daily. 30 capsule 4   Current Facility-Administered Medications  Medication Dose Route Frequency Provider Last Rate Last Admin  . methylPREDNISolone acetate (DEPO-MEDROL) injection 40 mg  40 mg Other Once Tyrell Antonio, MD        PHYSICAL EXAMINATION: ECOG PERFORMANCE STATUS: 1 - Symptomatic but completely ambulatory  Vitals:   10/14/19 1030  BP: 124/74  Pulse: 95  Resp: 16  Temp: 98.1 F (36.7 C)  SpO2: 93%   Filed Weights   10/14/19 1030  Weight: 216 lb 8 oz (98.2 kg)    LABORATORY DATA:  I have reviewed the data as listed  CMP Latest Ref Rng & Units 06/28/2019 03/15/2019 09/23/2018  Glucose 65 - 99 mg/dL 253(H) 257(H) 121(H)  BUN 8 - 27 mg/dL 18 25(H) 27  Creatinine 0.76 - 1.27 mg/dL 1.42(H) 1.36(H) 1.23  Sodium 134 - 144 mmol/L 140 135 142  Potassium 3.5 - 5.2 mmol/L 5.2 4.4 4.5  Chloride 96 - 106 mmol/L 102 101 102  CO2 20 - 29 mmol/L 25 23 24   Calcium 8.6 - 10.2 mg/dL 9.1 9.3 9.3  Total Protein 6.5 - 8.1 g/dL - - -  Total Bilirubin 0.3 - 1.2 mg/dL - - -  Alkaline Phos 38 - 126 U/L - - -  AST 15 - 41 U/L - - -  ALT 0 - 44 U/L - - -    Lab Results  Component  Value Date   WBC 8.1 10/14/2019   HGB 11.4 (L) 10/14/2019   HCT 37.2 (L) 10/14/2019   MCV 92.3 10/14/2019   PLT 91 (L) 10/14/2019   NEUTROABS 5.0 10/14/2019    ASSESSMENT & PLAN:  Acute ITP (HCC) /25/2019: Hemoglobin 8.6, MCV 83, WBC 3.5, platelets 86 creatinine 1.83 07/15/2018: Hemoglobin 10.6, platelets 63, WBC 4.3 01/03/2019: Hemoglobin 11.3, platelets 49, WBC 4.4, normal differential 07/05/2019:Hemoglobin 11.1, platelets 35  Current treatment: Prednisone 60 mg daily started 07/05/2019, dose being tapered since 07/19/2019 Lab review: 08/16/2019:Platelets 81, Hb 10.8 gm 09/13/2019: Platelets 78 10/14/19: Platelets 91  I encouraged him to cut down the dosage to 5 mg daily and then stop prednisone in 2 weeks. I will see him in 4 weeks with labs and follow-up.   No orders of the defined types were placed in this encounter.  The patient has a good understanding of the overall plan. he agrees with it. he will call with any problems that may develop before the next visit here.  Total time spent: 20 mins including face to face time and time spent for planning, charting and coordination of care  Nicholas Lose, MD 10/14/2019  I, Cloyde Reams Dorshimer, am acting as scribe for Dr. Nicholas Lose.  I have reviewed the above documentation for accuracy and completeness, and I agree with the above.

## 2019-10-14 ENCOUNTER — Inpatient Hospital Stay: Payer: Medicare PPO | Attending: Hematology and Oncology

## 2019-10-14 ENCOUNTER — Inpatient Hospital Stay (HOSPITAL_BASED_OUTPATIENT_CLINIC_OR_DEPARTMENT_OTHER): Payer: Medicare PPO | Admitting: Hematology and Oncology

## 2019-10-14 ENCOUNTER — Other Ambulatory Visit: Payer: Self-pay

## 2019-10-14 DIAGNOSIS — D693 Immune thrombocytopenic purpura: Secondary | ICD-10-CM

## 2019-10-14 DIAGNOSIS — N189 Chronic kidney disease, unspecified: Secondary | ICD-10-CM | POA: Diagnosis not present

## 2019-10-14 DIAGNOSIS — I129 Hypertensive chronic kidney disease with stage 1 through stage 4 chronic kidney disease, or unspecified chronic kidney disease: Secondary | ICD-10-CM | POA: Diagnosis not present

## 2019-10-14 DIAGNOSIS — D631 Anemia in chronic kidney disease: Secondary | ICD-10-CM | POA: Diagnosis not present

## 2019-10-14 DIAGNOSIS — Z794 Long term (current) use of insulin: Secondary | ICD-10-CM | POA: Diagnosis not present

## 2019-10-14 DIAGNOSIS — Z7901 Long term (current) use of anticoagulants: Secondary | ICD-10-CM | POA: Insufficient documentation

## 2019-10-14 DIAGNOSIS — Z79899 Other long term (current) drug therapy: Secondary | ICD-10-CM | POA: Diagnosis not present

## 2019-10-14 LAB — CBC WITH DIFFERENTIAL (CANCER CENTER ONLY)
Abs Immature Granulocytes: 0.02 10*3/uL (ref 0.00–0.07)
Basophils Absolute: 0.1 10*3/uL (ref 0.0–0.1)
Basophils Relative: 1 %
Eosinophils Absolute: 0.1 10*3/uL (ref 0.0–0.5)
Eosinophils Relative: 1 %
HCT: 37.2 % — ABNORMAL LOW (ref 39.0–52.0)
Hemoglobin: 11.4 g/dL — ABNORMAL LOW (ref 13.0–17.0)
Immature Granulocytes: 0 %
Lymphocytes Relative: 27 %
Lymphs Abs: 2.2 10*3/uL (ref 0.7–4.0)
MCH: 28.3 pg (ref 26.0–34.0)
MCHC: 30.6 g/dL (ref 30.0–36.0)
MCV: 92.3 fL (ref 80.0–100.0)
Monocytes Absolute: 0.8 10*3/uL (ref 0.1–1.0)
Monocytes Relative: 10 %
Neutro Abs: 5 10*3/uL (ref 1.7–7.7)
Neutrophils Relative %: 61 %
Platelet Count: 91 10*3/uL — ABNORMAL LOW (ref 150–400)
RBC: 4.03 MIL/uL — ABNORMAL LOW (ref 4.22–5.81)
RDW: 15.9 % — ABNORMAL HIGH (ref 11.5–15.5)
WBC Count: 8.1 10*3/uL (ref 4.0–10.5)
nRBC: 0 % (ref 0.0–0.2)

## 2019-10-14 NOTE — Telephone Encounter (Signed)
Called and left detailed message on home number (dpr on file) asking for patient to call the office to schedule follow up appt.  Will sign off.

## 2019-10-14 NOTE — Assessment & Plan Note (Signed)
/  25/2019: Hemoglobin 8.6, MCV 83, WBC 3.5, platelets 86 creatinine 1.83 07/15/2018: Hemoglobin 10.6, platelets 63, WBC 4.3 01/03/2019: Hemoglobin 11.3, platelets 49, WBC 4.4, normal differential 07/05/2019:Hemoglobin 11.1, platelets 35  Current treatment: Prednisone 60 mg daily started 07/05/2019, dose being tapered since 07/19/2019 Lab review: 08/16/2019:Platelets 81, Hb 10.8 gm 09/13/2019: Platelets 78 10/14/19: Platelets

## 2019-10-15 DIAGNOSIS — J449 Chronic obstructive pulmonary disease, unspecified: Secondary | ICD-10-CM | POA: Diagnosis not present

## 2019-10-20 DIAGNOSIS — E860 Dehydration: Secondary | ICD-10-CM | POA: Diagnosis not present

## 2019-10-20 DIAGNOSIS — G4733 Obstructive sleep apnea (adult) (pediatric): Secondary | ICD-10-CM | POA: Diagnosis not present

## 2019-10-20 DIAGNOSIS — J449 Chronic obstructive pulmonary disease, unspecified: Secondary | ICD-10-CM | POA: Diagnosis not present

## 2019-10-20 DIAGNOSIS — J8410 Pulmonary fibrosis, unspecified: Secondary | ICD-10-CM | POA: Diagnosis not present

## 2019-10-21 ENCOUNTER — Telehealth: Payer: Self-pay

## 2019-10-21 NOTE — Telephone Encounter (Signed)
The pcp prescribed him Sodium Polystyrene to take but he is not sure if he should take it. He would like for Dr. Graciela Husbands nurse to call him to let him know if he should take this medication. He do not have much faith in his pcp. The states he is not going to take it until Dr. Graciela Husbands or his nurse get back with him. I told him Mindi Junker is not in the office today but I will send her a phone note to call him when she is in the office. The pt verbalized understanding and thanked me for my help.

## 2019-10-24 NOTE — Telephone Encounter (Signed)
Phone call to pt.  Pt states he received this medication in the mail prescribed by His PCP Dr Marny Lowenstein at the [place].  Pt states he has not had any labs completed by PCP office but did have recent labs by Dr Randa Evens his endocrinologist.  He has not had any feedback as of yet re: those labs.  Pt advised to contact PCP office and ask why he received this medication in the mail. Pt states he will not start medication at this time. Pt verbalizes understanding and agrees with current plan.

## 2019-10-24 NOTE — Telephone Encounter (Signed)
M  CAN You call him  this sodium polystyrene is used for HYPER kalemia  the last level we had was 12/20 Has it been checked since? And what was the level

## 2019-10-25 DIAGNOSIS — J449 Chronic obstructive pulmonary disease, unspecified: Secondary | ICD-10-CM | POA: Diagnosis not present

## 2019-10-25 DIAGNOSIS — E860 Dehydration: Secondary | ICD-10-CM | POA: Diagnosis not present

## 2019-10-25 DIAGNOSIS — G4733 Obstructive sleep apnea (adult) (pediatric): Secondary | ICD-10-CM | POA: Diagnosis not present

## 2019-10-25 DIAGNOSIS — J8410 Pulmonary fibrosis, unspecified: Secondary | ICD-10-CM | POA: Diagnosis not present

## 2019-11-11 ENCOUNTER — Other Ambulatory Visit: Payer: Self-pay

## 2019-11-11 ENCOUNTER — Inpatient Hospital Stay: Payer: Medicare PPO | Attending: Hematology and Oncology

## 2019-11-11 DIAGNOSIS — D693 Immune thrombocytopenic purpura: Secondary | ICD-10-CM | POA: Diagnosis not present

## 2019-11-11 LAB — CBC WITH DIFFERENTIAL (CANCER CENTER ONLY)
Abs Immature Granulocytes: 0.02 10*3/uL (ref 0.00–0.07)
Basophils Absolute: 0 10*3/uL (ref 0.0–0.1)
Basophils Relative: 0 %
Eosinophils Absolute: 0 10*3/uL (ref 0.0–0.5)
Eosinophils Relative: 1 %
HCT: 35.8 % — ABNORMAL LOW (ref 39.0–52.0)
Hemoglobin: 11.1 g/dL — ABNORMAL LOW (ref 13.0–17.0)
Immature Granulocytes: 0 %
Lymphocytes Relative: 28 %
Lymphs Abs: 1.8 10*3/uL (ref 0.7–4.0)
MCH: 27.9 pg (ref 26.0–34.0)
MCHC: 31 g/dL (ref 30.0–36.0)
MCV: 89.9 fL (ref 80.0–100.0)
Monocytes Absolute: 0.7 10*3/uL (ref 0.1–1.0)
Monocytes Relative: 11 %
Neutro Abs: 3.9 10*3/uL (ref 1.7–7.7)
Neutrophils Relative %: 60 %
Platelet Count: 61 10*3/uL — ABNORMAL LOW (ref 150–400)
RBC: 3.98 MIL/uL — ABNORMAL LOW (ref 4.22–5.81)
RDW: 14.2 % (ref 11.5–15.5)
WBC Count: 6.4 10*3/uL (ref 4.0–10.5)
nRBC: 0 % (ref 0.0–0.2)

## 2019-11-15 DIAGNOSIS — J449 Chronic obstructive pulmonary disease, unspecified: Secondary | ICD-10-CM | POA: Diagnosis not present

## 2019-11-20 DIAGNOSIS — G4733 Obstructive sleep apnea (adult) (pediatric): Secondary | ICD-10-CM | POA: Diagnosis not present

## 2019-11-20 DIAGNOSIS — J8410 Pulmonary fibrosis, unspecified: Secondary | ICD-10-CM | POA: Diagnosis not present

## 2019-11-20 DIAGNOSIS — J449 Chronic obstructive pulmonary disease, unspecified: Secondary | ICD-10-CM | POA: Diagnosis not present

## 2019-11-20 DIAGNOSIS — E860 Dehydration: Secondary | ICD-10-CM | POA: Diagnosis not present

## 2019-11-25 DIAGNOSIS — E860 Dehydration: Secondary | ICD-10-CM | POA: Diagnosis not present

## 2019-11-25 DIAGNOSIS — G4733 Obstructive sleep apnea (adult) (pediatric): Secondary | ICD-10-CM | POA: Diagnosis not present

## 2019-11-25 DIAGNOSIS — J8410 Pulmonary fibrosis, unspecified: Secondary | ICD-10-CM | POA: Diagnosis not present

## 2019-11-25 DIAGNOSIS — J449 Chronic obstructive pulmonary disease, unspecified: Secondary | ICD-10-CM | POA: Diagnosis not present

## 2019-12-02 ENCOUNTER — Other Ambulatory Visit: Payer: Self-pay | Admitting: Cardiology

## 2019-12-02 NOTE — Telephone Encounter (Signed)
  *  STAT* If patient is at the pharmacy, call can be transferred to refill team.   1. Which medications need to be refilled? (please list name of each medication and dose if known) apixaban (ELIQUIS) 5 MG TABS tablet  2. Which pharmacy/location (including street and city if local pharmacy) is medication to be sent to? [id number] - [place], [place] - [address]  3. Do they need a 30 day or 90 day supply? 90 days

## 2019-12-06 MED ORDER — APIXABAN 5 MG PO TABS: 5.0000 mg | ORAL_TABLET | Freq: Two times a day (BID) | ORAL | 2 refills | Status: DC

## 2019-12-12 NOTE — Progress Notes (Signed)
[name] - 79 y.o. male MRN [id number]  Date of birth: January 03, 1941  Office Visit Note: Visit Date: 08/25/2019 PCP: Myrlene Broker, MD Referred by: Myrlene Broker, *  Subjective: Chief Complaint  Patient presents with  . Lower Back - Pain, Weakness   HPI:  [name] is a 79 y.o. male who comes in today for planned Bilateral S1-2 lumbar epidural steroid injection with fluoroscopic guidance.  The patient has failed conservative care including home exercise, medications, time and activity modification.  This injection will be diagnostic and hopefully therapeutic.  Please see requesting physician notes for further details and justification.  Pt states pain in the middle of the lower back. Pt states last injection 02/23/18 helped out a lot and lasted for awhile. Pt states pain is present all the time and standing for a long period of time makes pain worse. Sitting down helps with pain  ROS Otherwise per HPI.  Assessment & Plan: Visit Diagnoses:  1. Lumbar radiculopathy   2. Post laminectomy syndrome     Plan: No additional findings.   Meds & Orders:  Meds ordered this encounter  Medications  . methylPREDNISolone acetate (DEPO-MEDROL) injection 40 mg    Orders Placed This Encounter  Procedures  . XR C-ARM NO REPORT  . Epidural Steroid injection    Follow-up: Return if symptoms worsen or fail to improve.   Procedures: No procedures performed  S1 Lumbosacral Transforaminal Epidural Steroid Injection - Sub-Pedicular Approach with Fluoroscopic Guidance   Patient: [name]      Date of Birth: Sep 02, 1940 MRN: [id number] PCP: Myrlene Broker, MD      Visit Date: 08/25/2019   Universal Protocol:    Date/Time: 05/17/215:36 AM  Consent Given By: the patient  Position:  PRONE  Additional Comments: Vital signs were monitored before and after the procedure. Patient was prepped and draped in the usual sterile fashion. The correct  patient, procedure, and site was verified.   Injection Procedure Details:  Procedure Site One Meds Administered:  Meds ordered this encounter  Medications  . methylPREDNISolone acetate (DEPO-MEDROL) injection 40 mg    Laterality: Bilateral  Location/Site:  S1 Foramen   Needle size: 22 ga.  Needle type: Spinal  Needle Placement: Transforaminal  Findings:   -Comments: Excellent flow of contrast along the nerve and into the epidural space.  Epidurogram: Contrast epidurogram showed no nerve root cut off or restricted flow pattern.  Procedure Details: After squaring off the sacral end-plate to get a true AP view, the C-arm was positioned so that the best possible view of the S1 foramen was visualized. The soft tissues overlying this structure were infiltrated with 2-3 ml. of 1% Lidocaine without Epinephrine.    The spinal needle was inserted toward the target using a "trajectory" view along the fluoroscope beam.  Under AP and lateral visualization, the needle was advanced so it did not puncture dura. Biplanar projections were used to confirm position. Aspiration was confirmed to be negative for CSF and/or blood. A 1-2 ml. volume of Isovue-250 was injected and flow of contrast was noted at each level. Radiographs were obtained for documentation purposes.   After attaining the desired flow of contrast documented above, a 0.5 to 1.0 ml test dose of 0.25% Marcaine was injected into each respective transforaminal space.  The patient was observed for 90 seconds post injection.  After no sensory deficits were reported, and normal lower extremity motor function was noted,   the above injectate  was administered so that equal amounts of the injectate were placed at each foramen (level) into the transforaminal epidural space.   Additional Comments:  The patient tolerated the procedure well Dressing: Band-Aid with 2 x 2 sterile gauze    Post-procedure details: Patient was observed during  the procedure. Post-procedure instructions were reviewed.  Patient left the clinic in stable condition.     Clinical History: MRI LUMBAR SPINE WITHOUT CONTRAST 12/29/2015  TECHNIQUE: Multiplanar, multisequence MR imaging of the lumbar spine was performed. No intravenous contrast was administered.  COMPARISON:  None.  FINDINGS: Segmentation: 5 non rib-bearing lumbar type vertebral bodies are present.  Alignment: Slight retrolisthesis is present at L2-3. There is straightening of the normal lumbar lordosis. AP alignment is otherwise anatomic.  Vertebrae: Minimal chronic endplate marrow changes are present at L4-5.  Conus medullaris: Extends to the T12 level and appears normal.  Paraspinal and other soft tissues: A 2.3 cm cyst is present at the lower pole of the left kidney. No other focal lesions are present. There is no significant adenopathy.  Disc levels:  T11-12: A shallow central disc protrusion is present. There is no significant stenosis.  T12-L1: A superior disc extrusion extends 15 mm above the inferior endplate of A83. This partially effaces the ventral CSF. The foramina are patent bilaterally.  L1-2: A leftward broad-based disc protrusion is present. Mild facet hypertrophy is noted. This results in mild left central and foraminal narrowing.  L2-3: A broad-based disc protrusion is present. Moderate facet hypertrophy is evident. Moderate subarticular narrowing is noted bilaterally. Mild foraminal narrowing is present bilaterally as well.  L3-4: A broad-based disc protrusion is present. Mild facet hypertrophy is noted. Moderate left and mild right subarticular narrowing is present. Moderate left and mild right foraminal narrowing is present.  L4-5: A left laminectomy is noted. Residual moderate left and mild right subarticular narrowing is present. Moderate left and mild right foraminal stenosis is stable.  L5-S1: A prominent central disc  protrusion is again noted. There is moderate subarticular and bilateral foraminal narrowing without significant interval change.  IMPRESSION: 1. 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 2. Superior disc extrusion at T12-L1 extends 15 mm above the inferior endplate of M19. 3. Mild left central and foraminal stenosis at L1-2. 4. Moderate subarticular and mild foraminal narrowing bilaterally at L2-3. 5. Moderate left and mild right subarticular and foraminal narrowing bilaterally at L3-4 and L4-5 is stable. 6. Moderate subarticular and bilateral foraminal stenosis at L5-S1 without significant interval change.     Objective:  VS:  HT:    WT:   BMI:     BP:(!) 151/84  HR:(!) 59bpm  TEMP: ( )  RESP:  Physical Exam   Imaging: No results found.

## 2019-12-12 NOTE — Procedures (Signed)
S1 Lumbosacral Transforaminal Epidural Steroid Injection - Sub-Pedicular Approach with Fluoroscopic Guidance   Patient: [name]      Date of Birth: 12-Jan-1941 MRN: [id number] PCP: Myrlene Broker, MD      Visit Date: 08/25/2019   Universal Protocol:    Date/Time: 05/17/215:36 AM  Consent Given By: the patient  Position:  PRONE  Additional Comments: Vital signs were monitored before and after the procedure. Patient was prepped and draped in the usual sterile fashion. The correct patient, procedure, and site was verified.   Injection Procedure Details:  Procedure Site One Meds Administered:  Meds ordered this encounter  Medications  . methylPREDNISolone acetate (DEPO-MEDROL) injection 40 mg    Laterality: Bilateral  Location/Site:  S1 Foramen   Needle size: 22 ga.  Needle type: Spinal  Needle Placement: Transforaminal  Findings:   -Comments: Excellent flow of contrast along the nerve and into the epidural space.  Epidurogram: Contrast epidurogram showed no nerve root cut off or restricted flow pattern.  Procedure Details: After squaring off the sacral end-plate to get a true AP view, the C-arm was positioned so that the best possible view of the S1 foramen was visualized. The soft tissues overlying this structure were infiltrated with 2-3 ml. of 1% Lidocaine without Epinephrine.    The spinal needle was inserted toward the target using a "trajectory" view along the fluoroscope beam.  Under AP and lateral visualization, the needle was advanced so it did not puncture dura. Biplanar projections were used to confirm position. Aspiration was confirmed to be negative for CSF and/or blood. A 1-2 ml. volume of Isovue-250 was injected and flow of contrast was noted at each level. Radiographs were obtained for documentation purposes.   After attaining the desired flow of contrast documented above, a 0.5 to 1.0 ml test dose of 0.25% Marcaine was injected into  each respective transforaminal space.  The patient was observed for 90 seconds post injection.  After no sensory deficits were reported, and normal lower extremity motor function was noted,   the above injectate was administered so that equal amounts of the injectate were placed at each foramen (level) into the transforaminal epidural space.   Additional Comments:  The patient tolerated the procedure well Dressing: Band-Aid with 2 x 2 sterile gauze    Post-procedure details: Patient was observed during the procedure. Post-procedure instructions were reviewed.  Patient left the clinic in stable condition.

## 2019-12-15 DIAGNOSIS — J449 Chronic obstructive pulmonary disease, unspecified: Secondary | ICD-10-CM | POA: Diagnosis not present

## 2019-12-20 DIAGNOSIS — E860 Dehydration: Secondary | ICD-10-CM | POA: Diagnosis not present

## 2019-12-20 DIAGNOSIS — G4733 Obstructive sleep apnea (adult) (pediatric): Secondary | ICD-10-CM | POA: Diagnosis not present

## 2019-12-20 DIAGNOSIS — J449 Chronic obstructive pulmonary disease, unspecified: Secondary | ICD-10-CM | POA: Diagnosis not present

## 2019-12-20 DIAGNOSIS — J8410 Pulmonary fibrosis, unspecified: Secondary | ICD-10-CM | POA: Diagnosis not present

## 2019-12-25 DIAGNOSIS — J8410 Pulmonary fibrosis, unspecified: Secondary | ICD-10-CM | POA: Diagnosis not present

## 2019-12-25 DIAGNOSIS — G4733 Obstructive sleep apnea (adult) (pediatric): Secondary | ICD-10-CM | POA: Diagnosis not present

## 2019-12-25 DIAGNOSIS — E860 Dehydration: Secondary | ICD-10-CM | POA: Diagnosis not present

## 2019-12-25 DIAGNOSIS — J449 Chronic obstructive pulmonary disease, unspecified: Secondary | ICD-10-CM | POA: Diagnosis not present

## 2019-12-28 NOTE — Progress Notes (Deleted)
Virtual Visit via Video Note   This visit type was conducted due to national recommendations for restrictions regarding the COVID-19 Pandemic (e.g. social distancing) in an effort to limit this patient's exposure and mitigate transmission in our community.  Due to his co-morbid illnesses, this patient is at least at moderate risk for complications without adequate follow up.  This format is felt to be most appropriate for this patient at this time.  All issues noted in this document were discussed and addressed.  A limited physical exam was performed with this format.  Please refer to the patient's chart for his consent to telehealth for [facility].   Date:  12/28/2019   ID:  [name], DOB October 10, 1940, MRN [id number]  Patient Location:Home Provider Location: Home  PCP:  Myrlene Broker, MD  Cardiologist:  Dr Jens Som  Evaluation Performed:  Follow-Up Visit  Chief Complaint:  FU CAD  History of Present Illness:    FU CADand syncope.Cardiac cath 2/18 showed 30 LM, 50 LAD, aneurysmal RCA with 80 mid to distal; had DES to RCA at that time.Carotid dopplers 2/19 showed no significant stenosis. Admitted with sepsis 2/19 and also with atrial fibrillation, converted spontaneously to sinus; TSH low and synthroid adjusted. Amiodarone discontinued at previous office visit. Echocardiogram August 2020 showed normal LV function. 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 Patient had implantable loop recorder placed September 2020.  5-second pause documented; had pacemaker placed December 2020.  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  Since last seen,   The patient does not have symptoms concerning for COVID-19  infection (fever, chills, cough, or new shortness of breath).    Past Medical History:  Diagnosis Date  . 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 Anxiety   . Aortic aneurysm (HCC) 09/02/2017  . BENIGN PROSTATIC HYPERTROPHY 11/23/2009  . Cardiomyopathy (HCC) 08/28/2016  . Chronic systolic CHF (congestive heart failure) (HCC) 09/02/2017  . COPD (chronic obstructive pulmonary disease) (HCC)   . CORONARY ARTERY DISEASE 11/23/2009  . 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 DEGENERATIVE JOINT DISEASE 11/23/2009  . DEPRESSION 11/23/2009  . FATIGUE 11/23/2009  . GAIT DISTURBANCE 12/10/2009  . HEMOPTYSIS UNSPECIFIED 05/07/2010  . High cholesterol   . HYPERTENSION 07/30/2009  . HYPOTHYROIDISM 07/30/2009  . Ischemic cardiomyopathy 09/02/2017  . 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 On home oxygen therapy    "2-3L; 24/7" (09/10/2016)  . OSA on CPAP   . PTSD (post-traumatic stress disorder) 03/10/2012  . 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 RA (rheumatoid arthritis) (HCC) 06/11/2011   "qwhere" (09/10/2016)  . RESPIRATORY FAILURE, CHRONIC 07/31/2009  . Scleritis of both eyes 03/17/2014  . Thrombocytopenia (HCC)   . 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 Type II diabetes mellitus (HCC)    Past Surgical History:  Procedure Laterality Date  . 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 ABDOMINAL EXPLORATION SURGERY  02/2004   w/LOA/notes 12/10/2010; small bowel obstruction repair with adhesiolysis   . BACK SURGERY    . CARDIAC CATHETERIZATION     2 heart caths in the past.  One in 2000s showed one ulcerated plaque  Rx medically; Second at [facility] [name] 09/05/2016  . CATARACT EXTRACTION W/ INTRAOCULAR LENS  IMPLANT, BILATERAL Bilateral 2000s  .  COLECTOMY     hx of remote ileum resection due to bleeding  . COLONOSCOPY WITH PROPOFOL N/A 01/22/2018   Procedure: COLONOSCOPY WITH PROPOFOL;  Surgeon: Iva Boop, MD;  Location: [facility] ENDOSCOPY;  Service: Endoscopy;  Laterality: N/A;  . CORONARY ANGIOPLASTY WITH  STENT PLACEMENT  09/10/2016  . CORONARY STENT INTERVENTION N/A 09/10/2016   Procedure: Coronary Stent Intervention;  Surgeon: Kathleene Hazel, MD;  Location: [facility] INVASIVE CV LAB;  Service: Cardiovascular;  Laterality: N/A;  Distal RCA 4.0x16 Synergy  . ESOPHAGOGASTRODUODENOSCOPY (EGD) WITH PROPOFOL N/A 01/22/2018   Procedure: ESOPHAGOGASTRODUODENOSCOPY (EGD) WITH PROPOFOL;  Surgeon: Iva Boop, MD;  Location: [facility] ENDOSCOPY;  Service: Endoscopy;  Laterality: N/A;  . FEMORAL EMBOLOECTOMY Left 07/2000   with left leg ischemia; Dr. Hart Rochester, vascular  . GANGLION CYST EXCISION Right    "wrist"; Dr. Teressa Senter  . HOT HEMOSTASIS N/A 01/22/2018   Procedure: HOT HEMOSTASIS (ARGON PLASMA COAGULATION/BICAP);  Surgeon: Iva Boop, MD;  Location: [facility];  Service: Endoscopy;  Laterality: N/A;  . LOOP RECORDER INSERTION N/A 04/25/2019   Procedure: LOOP RECORDER INSERTION;  Surgeon: Duke Salvia, MD;  Location: [facility] INVASIVE CV LAB;  Service: Cardiovascular;  Laterality: N/A;  . LOOP RECORDER REMOVAL N/A 07/01/2019   Procedure: LOOP RECORDER REMOVAL;  Surgeon: Duke Salvia, MD;  Location: [facility] INVASIVE CV LAB;  Service: Cardiovascular;  Laterality: N/A;  . LUMBAR LAMINECTOMY  1972   Dr. Fannie Knee  . PACEMAKER IMPLANT N/A 07/01/2019   Procedure: PACEMAKER IMPLANT;  Surgeon: Duke Salvia, MD;  Location: [facility] INVASIVE CV LAB;  Service: Cardiovascular;  Laterality: N/A;  . RIGHT/LEFT HEART CATH AND CORONARY ANGIOGRAPHY N/A 09/10/2016   Procedure: Right/Left Heart Cath and Coronary Angiography;  Surgeon: Kathleene Hazel, MD;  Location: [facility] INVASIVE CV LAB;  Service: Cardiovascular;  Laterality: N/A;  . TONSILLECTOMY       No outpatient medications have been marked as taking for the 12/30/19 encounter (Appointment) with Lewayne Bunting, MD.   Current Facility-Administered Medications for the 12/30/19 encounter (Appointment) with Lewayne Bunting, MD  Medication  . methylPREDNISolone acetate  (DEPO-MEDROL) injection 40 mg     Allergies:   Patient has no known allergies.   Social History   Tobacco Use  . Smoking status: Former Smoker    Packs/day: 2.50    Years: 40.00    Pack years: 100.00    Types: Cigarettes, Pipe, Cigars    Quit date: 07/28/1998    Years since quitting: 21.4  . Smokeless tobacco: Never Used  Substance Use Topics  . Alcohol use: No    Alcohol/week: 0.0 standard drinks  . Drug use: No     Family Hx: The patient's family history includes Other in his mother.  ROS:   Please see the history of present illness.    No Fever, chills  or productive cough All other systems reviewed and are negative.   Recent Labs: 06/28/2019: BUN 18; Creatinine, Ser 1.42; Potassium 5.2; Sodium 140 11/11/2019: Hemoglobin 11.1; Platelet Count 61   Recent Lipid Panel Lab Results  Component Value Date/Time   CHOL 111 02/17/2018 12:00 AM   CHOL 123 12/23/2016 10:53 AM   TRIG 112 02/17/2018 12:00 AM   HDL 26 (A) 02/17/2018 12:00 AM   HDL 37 (L) 12/23/2016 10:53 AM   CHOLHDL 3.5 09/03/2017 02:59 AM   LDLCALC 63 02/17/2018 12:00 AM   LDLCALC 65 12/23/2016 10:53 AM   LDLDIRECT 106.0 10/02/2016 10:45 AM    Wt Readings  from Last 3 Encounters:  10/14/19 216 lb 8 oz (98.2 kg)  10/06/19 210 lb (95.3 kg)  09/13/19 214 lb 11.2 oz (97.4 kg)     Objective:    Vital Signs:  There were no vitals taken for this visit.   VITAL SIGNS:  reviewed NAD Answers questions appropriately Normal affect Remainder of physical examination not performed (telehealth visit; coronavirus pandemic)  ASSESSMENT & PLAN:    1. Coronary artery disease-patient denies chest pain.  Plan to continue statin.  He is not on aspirin given need for apixaban. 2. Paroxysmal atrial fibrillation-patient is in sinus rhythm based on history.  Continue apixaban.  Amiodarone was discontinued previously due to baseline lung disease.  Also of note previous episode of atrial fibrillation occurred in the setting  of urosepsis. 3. Hypertension-blood pressure controlled.  Continue present medications. 4. Hyperlipidemia-continue statin. 5. History of cardiomyopathy-LV function has improved on most recent echocardiogram. 6. Thoracic/abdominal/iliac aneurysms-plan follow-up CTA August 2021. 7. Chronic combined systolic/diastolic congestive heart failure-based on history he is euvolemic. 8. Pulmonary nodule-Per pulmonary. 9. Status post pacemaker-follow-up electrophysiology.  COVID-19 Education: The importance of social distancing was discussed today.  Time:   Today, I have spent 16 minutes with the patient with telehealth technology discussing the above problems.     Medication Adjustments/Labs and Tests Ordered: Current medicines are reviewed at length with the patient today.  Concerns regarding medicines are outlined above.   Tests Ordered: No orders of the defined types were placed in this encounter.   Medication Changes: No orders of the defined types were placed in this encounter.   Follow Up:  In Person in 6 month(s)  Signed, Kirk Ruths, MD  12/28/2019 7:43 AM    [place]

## 2019-12-30 ENCOUNTER — Telehealth: Payer: Medicare PPO | Admitting: Cardiology

## 2019-12-30 ENCOUNTER — Telehealth: Payer: Self-pay

## 2019-12-30 ENCOUNTER — Ambulatory Visit (INDEPENDENT_AMBULATORY_CARE_PROVIDER_SITE_OTHER): Payer: Medicare PPO | Admitting: *Deleted

## 2019-12-30 DIAGNOSIS — R55 Syncope and collapse: Secondary | ICD-10-CM | POA: Diagnosis not present

## 2019-12-30 LAB — CUP PACEART REMOTE DEVICE CHECK
Battery Remaining Longevity: 174 mo
Battery Voltage: 3.2 V
Brady Statistic AP VP Percent: 6.76 %
Brady Statistic AP VS Percent: 2.56 %
Brady Statistic AS VP Percent: 0.07 %
Brady Statistic AS VS Percent: 90.62 %
Brady Statistic RA Percent Paced: 9.59 %
Brady Statistic RV Percent Paced: 6.82 %
Date Time Interrogation Session: 20210603190335
Implantable Lead Implant Date: 20201204
Implantable Lead Implant Date: 20201204
Implantable Lead Location: 753859
Implantable Lead Location: 753860
Implantable Lead Model: 5076
Implantable Lead Model: 5076
Implantable Pulse Generator Implant Date: 20201204
Lead Channel Impedance Value: 342 Ohm
Lead Channel Impedance Value: 342 Ohm
Lead Channel Impedance Value: 399 Ohm
Lead Channel Impedance Value: 399 Ohm
Lead Channel Pacing Threshold Amplitude: 0.375 V
Lead Channel Pacing Threshold Amplitude: 0.5 V
Lead Channel Pacing Threshold Pulse Width: 0.4 ms
Lead Channel Pacing Threshold Pulse Width: 0.4 ms
Lead Channel Sensing Intrinsic Amplitude: 2.75 mV
Lead Channel Sensing Intrinsic Amplitude: 2.75 mV
Lead Channel Sensing Intrinsic Amplitude: 9.625 mV
Lead Channel Sensing Intrinsic Amplitude: 9.625 mV
Lead Channel Setting Pacing Amplitude: 1.5 V
Lead Channel Setting Pacing Amplitude: 2.5 V
Lead Channel Setting Pacing Pulse Width: 0.4 ms
Lead Channel Setting Sensing Sensitivity: 2 mV

## 2019-12-30 NOTE — Telephone Encounter (Signed)
The pt states he felt too dizzy to drive to his 7 am appointment. I let the pt know that he did not have an in office appointment. His appointment was with his home monitor. I asked him was he still feeling dizzy and he states no. He checked his sugar and it was low. He thanked me for calling him back.

## 2020-01-04 NOTE — Progress Notes (Signed)
Remote pacemaker transmission.   

## 2020-01-15 DIAGNOSIS — J449 Chronic obstructive pulmonary disease, unspecified: Secondary | ICD-10-CM | POA: Diagnosis not present

## 2020-01-20 DIAGNOSIS — J8410 Pulmonary fibrosis, unspecified: Secondary | ICD-10-CM | POA: Diagnosis not present

## 2020-01-20 DIAGNOSIS — E860 Dehydration: Secondary | ICD-10-CM | POA: Diagnosis not present

## 2020-01-20 DIAGNOSIS — J449 Chronic obstructive pulmonary disease, unspecified: Secondary | ICD-10-CM | POA: Diagnosis not present

## 2020-01-20 DIAGNOSIS — G4733 Obstructive sleep apnea (adult) (pediatric): Secondary | ICD-10-CM | POA: Diagnosis not present

## 2020-01-25 DIAGNOSIS — G4733 Obstructive sleep apnea (adult) (pediatric): Secondary | ICD-10-CM | POA: Diagnosis not present

## 2020-01-25 DIAGNOSIS — J8410 Pulmonary fibrosis, unspecified: Secondary | ICD-10-CM | POA: Diagnosis not present

## 2020-01-25 DIAGNOSIS — E860 Dehydration: Secondary | ICD-10-CM | POA: Diagnosis not present

## 2020-01-25 DIAGNOSIS — J449 Chronic obstructive pulmonary disease, unspecified: Secondary | ICD-10-CM | POA: Diagnosis not present

## 2020-02-07 ENCOUNTER — Other Ambulatory Visit: Payer: Self-pay

## 2020-02-07 ENCOUNTER — Telehealth: Payer: Self-pay | Admitting: Acute Care

## 2020-02-07 ENCOUNTER — Telehealth: Payer: Self-pay | Admitting: Adult Health

## 2020-02-07 ENCOUNTER — Other Ambulatory Visit (INDEPENDENT_AMBULATORY_CARE_PROVIDER_SITE_OTHER): Payer: Medicare PPO

## 2020-02-07 ENCOUNTER — Ambulatory Visit (INDEPENDENT_AMBULATORY_CARE_PROVIDER_SITE_OTHER)
Admission: RE | Admit: 2020-02-07 | Discharge: 2020-02-07 | Disposition: A | Discharge status: Home / Self Care | Payer: Medicare PPO | Source: Ambulatory Visit | Attending: Primary Care | Admitting: Primary Care

## 2020-02-07 ENCOUNTER — Other Ambulatory Visit: Payer: Self-pay | Admitting: Acute Care

## 2020-02-07 DIAGNOSIS — R042 Hemoptysis: Secondary | ICD-10-CM

## 2020-02-07 DIAGNOSIS — J439 Emphysema, unspecified: Secondary | ICD-10-CM | POA: Diagnosis not present

## 2020-02-07 LAB — CBC WITH DIFFERENTIAL/PLATELET
Basophils Absolute: 0 10*3/uL (ref 0.0–0.1)
Basophils Relative: 0.3 % (ref 0.0–3.0)
Eosinophils Absolute: 0 10*3/uL (ref 0.0–0.7)
Eosinophils Relative: 0.4 % (ref 0.0–5.0)
HCT: 32.9 % — ABNORMAL LOW (ref 39.0–52.0)
Hemoglobin: 10.8 g/dL — ABNORMAL LOW (ref 13.0–17.0)
Lymphocytes Relative: 29.1 % (ref 12.0–46.0)
Lymphs Abs: 1.6 10*3/uL (ref 0.7–4.0)
MCHC: 32.9 g/dL (ref 30.0–36.0)
MCV: 81.7 fl (ref 78.0–100.0)
Monocytes Absolute: 0.7 10*3/uL (ref 0.1–1.0)
Monocytes Relative: 12.9 % — ABNORMAL HIGH (ref 3.0–12.0)
Neutro Abs: 3.1 10*3/uL (ref 1.4–7.7)
Neutrophils Relative %: 57.3 % (ref 43.0–77.0)
Platelets: 37 10*3/uL — CL (ref 150.0–400.0)
RBC: 4.03 Mil/uL — ABNORMAL LOW (ref 4.22–5.81)
RDW: 15.5 % (ref 11.5–15.5)
WBC: 5.4 10*3/uL (ref 4.0–10.5)

## 2020-02-07 NOTE — Telephone Encounter (Signed)
See if he can come into office tomorrow to be seen in person with Maralyn Sago or you can put him on my schedule as I am virtual. He needs stat CXR prior to visit. Please order. If hemoptysis becomes worse over night needs ED eval. Recommend delsym cough syrup. Hold blood thinners if he is on

## 2020-02-07 NOTE — Telephone Encounter (Signed)
Please place order for CBC with diff prior to OV tomorrow at 09:30 ( Have come in 15 minutes early to draw and run as stat). Thanks

## 2020-02-07 NOTE — Telephone Encounter (Signed)
Patient appointment made for 7/14 at 9:30, CXR ordered, message from NP reviewed. Patient will arrive early for CXR.

## 2020-02-07 NOTE — Telephone Encounter (Signed)
Called and spoke with pt about labwork prior to him seeing Maralyn Sago and he verbalized understanding. Pt also knows about needing a chest xray as well. Nothing further needed.

## 2020-02-07 NOTE — Progress Notes (Signed)
Patient chest x-ray results, patient is seeing you tomorrow for hemoptysis.  May need to CT chest to better evaluate plus or minus antibiotic treatment

## 2020-02-07 NOTE — Progress Notes (Signed)
History of Present Illness [name] is a 79 y.o. male with ***   02/07/2020  Test Results:  CBC Latest Ref Rng & Units 11/11/2019 10/14/2019 09/13/2019  WBC 4.0 - 10.5 K/uL 6.4 8.1 7.7  Hemoglobin 13.0 - 17.0 g/dL 11.1(L) 11.4(L) 11.0(L)  Hematocrit 39 - 52 % 35.8(L) 37.2(L) 34.3(L)  Platelets 150 - 400 K/uL 61(L) 91(L) 79(L)    BMP Latest Ref Rng & Units 06/28/2019 03/15/2019 09/23/2018  Glucose 65 - 99 mg/dL 518(A) 416(S) 063(K)  BUN 8 - 27 mg/dL 18 16(W) 27  Creatinine 0.76 - 1.27 mg/dL 1.09(N) 2.35(T) 7.32  BUN/Creat Ratio 10 - 24 13 - 22  Sodium 134 - 144 mmol/L 140 135 142  Potassium 3.5 - 5.2 mmol/L 5.2 4.4 4.5  Chloride 96 - 106 mmol/L 102 101 102  CO2 20 - 29 mmol/L 25 23 24   Calcium 8.6 - 10.2 mg/dL 9.1 9.3 9.3    BNP No results found for: BNP  ProBNP    Component Value Date/Time   PROBNP 188 10/20/2016 1038   PROBNP 59.9 05/04/2011 0517    PFT    Component Value Date/Time   FEV1PRE 1.28 11/16/2015 0955   FEV1POST 1.33 11/16/2015 0955   FVCPRE 2.96 11/16/2015 0955   FVCPOST 2.89 11/16/2015 0955   TLC 6.31 12/30/2013 0945   DLCOUNC 9.88 11/16/2015 0955   PREFEV1FVCRT 43 11/16/2015 0955   PSTFEV1FVCRT 46 11/16/2015 0955    No results found.   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   DEGENERATIVE JOINT DISEASE 11/23/2009   DEPRESSION 11/23/2009   FATIGUE 11/23/2009   GAIT DISTURBANCE 12/10/2009   HEMOPTYSIS UNSPECIFIED 05/07/2010   High cholesterol    HYPERTENSION 07/30/2009   HYPOTHYROIDISM 07/30/2009   Ischemic cardiomyopathy 09/02/2017   LUMBAR  RADICULOPATHY, 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   On home oxygen therapy    "2-3L; 24/7" (09/10/2016)   OSA on CPAP    PTSD (post-traumatic stress disorder) 03/10/2012   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   RA (rheumatoid arthritis) (HCC) 06/11/2011   "qwhere" (09/10/2016)   RESPIRATORY FAILURE, CHRONIC 07/31/2009   Scleritis of both eyes 03/17/2014   Thrombocytopenia (HCC)    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   Type II diabetes mellitus (HCC)      Social History   Tobacco Use   Smoking status: Former Smoker    Packs/day: 2.50    Years: 40.00    Pack years: 100.00    Types: Cigarettes, Pipe, Cigars    Quit date: 07/28/1998    Years since quitting: 21.5   Smokeless tobacco: Never Used  Building services engineer Use: Never used  Substance Use Topics   Alcohol use: No    Alcohol/week: 0.0 standard drinks   Drug use: No    Mr.Manocchio reports that he quit smoking about 21 years ago. His smoking use included cigarettes, pipe, and cigars. He has a 100.00 pack-year smoking history. He has never used smokeless tobacco. He reports that he does not drink alcohol and does not use drugs.  Tobacco Cessation: Counseling given: Not Answered   Past surgical hx, Family hx, Social hx all reviewed.  Current Outpatient Medications on File Prior to Visit  Medication Sig   albuterol (PROVENTIL) (2.5 MG/3ML) 0.083% nebulizer solution 1 vial in nebulizer every 6 hours as needed Dx 496   ALPRAZolam (XANAX) 0.5 MG tablet Take 0.5 mg by mouth at bedtime.    apixaban (ELIQUIS) 5 MG TABS tablet Take 1 tablet (5 mg total) by mouth 2 (two) times daily.   atorvastatin (LIPITOR) 40 MG tablet Take 1 tablet (40 mg total) by mouth daily.   Carboxymethylcellul-Glycerin (LUBRICATING EYE DROPS OP) Place 1 drop into both eyes 4 (four) times daily as needed (dry eyes).    Cholecalciferol (VITAMIN D3) 2000 units capsule Take 2,000 Units by mouth daily.    insulin glargine (LANTUS) 100 UNIT/ML injection Inject 60 Units into the skin at  bedtime.    levothyroxine (SYNTHROID) 125 MCG tablet Take 125 mcg by mouth daily before breakfast.    losartan (COZAAR) 50 MG tablet Take 1 tablet (50 mg total) by mouth daily.   metFORMIN (GLUCOPHAGE) 500 MG tablet Take 500 mg by mouth 2 (two) times daily with a meal.   Multiple Vitamins-Minerals (MEGA MULTIVITAMIN FOR MEN PO) Take 1 tablet by mouth daily.    Olodaterol HCl (STRIVERDI RESPIMAT) 2.5 MCG/ACT AERS Inhale 1 puff into the lungs daily.   OXYGEN Inhale 2 L into the lungs continuous.   predniSONE (DELTASONE) 20 MG tablet Take 3 tablets (60 mg total) by mouth daily with breakfast. (Patient taking differently: Take 10 mg by mouth daily with breakfast. )   Semaglutide (OZEMPIC) 1 MG/DOSE SOPN Inject 1 mg into the skin every Friday.    tamsulosin (FLOMAX) 0.4 MG CAPS capsule Take 1 capsule (0.4 mg total) by mouth at bedtime.   tiotropium (SPIRIVA HANDIHALER) 18 MCG inhalation capsule Place 1 capsule (18 mcg total) into inhaler and inhale daily.   Current Facility-Administered Medications on File Prior to Visit  Medication   methylPREDNISolone acetate (DEPO-MEDROL) injection 40 mg     No Known Allergies  Review Of Systems:  Constitutional:   No  weight loss, night sweats,  Fevers, chills, fatigue, or  lassitude.  HEENT:   No headaches,  Difficulty swallowing,  Tooth/dental problems, or  Sore throat,                No sneezing, itching, ear ache, nasal congestion, post nasal drip,   CV:  No chest pain,  Orthopnea, PND, swelling in lower extremities, anasarca, dizziness, palpitations, syncope.   GI  No heartburn, indigestion, abdominal pain, nausea, vomiting, diarrhea, change in bowel habits, loss of appetite, bloody stools.   Resp: No shortness of breath with exertion or at rest.  No excess mucus, no productive cough,  No non-productive cough,  No coughing up of blood.  No change in color of mucus.  No wheezing.  No chest wall deformity  Skin: no rash or lesions.  GU:  no dysuria, change in color of urine, no urgency or frequency.  No flank pain, no hematuria   MS:  No joint pain or swelling.  No decreased range of motion.  No back pain.  Psych:  No change in mood or affect. No depression or anxiety.  No memory loss.   Vital Signs There were no vitals taken for this visit.   Physical Exam:  General- No distress,  A&Ox3 ENT: No sinus tenderness, TM clear, pale nasal mucosa, no oral exudate,no post nasal drip, no LAN Cardiac: S1, S2, regular rate and rhythm, no murmur Chest: No wheeze/ rales/  dullness; no accessory muscle use, no nasal flaring, no sternal retractions Abd.: Soft Non-tender Ext: No clubbing cyanosis, edema Neuro:  normal strength Skin: No rashes, warm and dry Psych: normal mood and behavior   Assessment/Plan  No problem-specific Assessment & Plan notes found for this encounter.    Bevelyn Ngo, NP 02/07/2020  2:56 PM

## 2020-02-07 NOTE — Telephone Encounter (Signed)
Sick call today, coughing up bright red dime size blood clots several times a day, started one week ago, reports edema +2 in feet and ankles, and increase SOB (only has half his normal breath). Please advise.

## 2020-02-07 NOTE — Telephone Encounter (Signed)
Received call report from [facility] lab on patient's CBC done on 02/07/2020. Platelet count is at 37.  Maralyn Sago - please advise. Thanks.

## 2020-02-07 NOTE — Addendum Note (Signed)
Addended by: Wyvonne Lenz on: 02/07/2020 04:00 PM   Modules accepted: Orders

## 2020-02-07 NOTE — Progress Notes (Signed)
History of Present Illness 79 year old male former smoker followed for severe COPD, chronic respiratory failure on oxygen at 2 L.  Patient has obstructive sleep apnea on nocturnal CPAP.  Patient also has known lung nodules that are followed serially on CT chest. Medical history is significant for rheumatoid arthritis (no maintenance medications) diabetes, congestive heart failure (diagnosed 2018 with echo showing EF at 35 to 40%), coronary artery disease status post RCA stent and March 07, 2018chronic kidney disease, A. 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 Vassie Loll and Babette Relic Parrett NP  02/09/2020 Acute OV for hemoptysis , coughing up bright red dime size blood clots several times a day, started one week ago, reports edema +2 in feet and ankles, and increase SOB (only has half his normal breath). Pt is on Eliquis , and has a history of anemia and thrombocytopenia. He also has a significant cardiac history and is followed for pulmonary nodules. Pt. Presents for an acute OV for hemoptysis. He had a CXR and CBC , ordered by this practice on 7/13 , which was revealing of a platelet count of 35.000 and CXR showed a patchy opacity in the right lung base which could be inflammatory or infectious. He states he has not had a fever. He states he stopped his Eliquis 7/13 in the pm. He did take his am dose 7/13 . He did not take todays am dose. He did cough up blood last night at 10 pm. He has not coughed any blood up this morning. Additionally his oxygen saturations this morning are in the in the 80's.  His CXR is + for an infiltrate in his right lung base. His WBC is normal. He states he has not had a cough. 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  He has not been on  prednisone since April 2021. Last platelet count was 61,000 in April.2021 He is also complaining of leg swelling and tightness. He states his weight is up 5 pounds. He states he has been on Lasix before, but has not had to take it for the last 3-4 years.  His saturations were 83% on 2L. He has stabilized on 4 L to a sat of 98%. His wife passed away in 02-Oct-2019. He states this has been very stressful, and that he misses her a great deal.  We will refer to cardiology for an urgent visit as the patient has come off his eliquis due to hemoptysis.Additionally he needs evaluation for his heart failure. We will refer to hematology for an urgent visit for treatment of his ITP. He has been scheduled for 11 am today.     Test Results:  02/07/2020:CBC WBC 5.4 HGB 10.8 Platelets 37,000  CXR 02/07/2020 Hazy patchy airspace opacity at the right lung base which could be due to inflammatory or infectious etiology. Followup PA and lateral chest X-ray is recommended in 3-4 weeks upon resolution of symptoms to ensure resolution and exclude underlying malignancy   CBC Latest Ref Rng & Units 02/08/2020 02/07/2020 11/11/2019  WBC 4.0 - 10.5 K/uL 6.2 5.4 6.4  Hemoglobin 13.0 - 17.0 g/dL 10.9(L) 10.8(L) 11.1(L)  Hematocrit 39 - 52 % 32.7(L) 32.9(L) 35.8(L)  Platelets 150 - 400 K/uL 42.0 Repeated and verified X2.(LL) 37.0 Repeated and verified X2.(LL) 61(L)  BMP Latest Ref Rng & Units 02/08/2020 06/28/2019 03/15/2019  Glucose 70 - 99 mg/dL 299(B) 716(R) 678(L)  BUN 6 - 23 mg/dL 20 18 38(B)  Creatinine 0.40 - 1.50 mg/dL 0.17 5.10(C) 5.85(I)  BUN/Creat Ratio 10 - 24 - 13 -  Sodium 135 - 145 mEq/L 139 140 135  Potassium 3.5 - 5.1 mEq/L 4.2 5.2 4.4  Chloride 96 - 112 mEq/L 103 102 101  CO2 19 - 32 mEq/L 28 25 23   Calcium 8.4 - 10.5 mg/dL 9.2 9.1 9.3    BNP No results found for: BNP  ProBNP    Component Value Date/Time   PROBNP 188 10/20/2016 1038   PROBNP 59.9 05/04/2011 0517    PFT      Component Value Date/Time   FEV1PRE 1.28 11/16/2015 0955   FEV1POST 1.33 11/16/2015 0955   FVCPRE 2.96 11/16/2015 0955   FVCPOST 2.89 11/16/2015 0955   TLC 6.31 12/30/2013 0945   DLCOUNC 9.88 11/16/2015 0955   PREFEV1FVCRT 43 11/16/2015 0955   PSTFEV1FVCRT 46 11/16/2015 0955    DG Chest 2 View  Result Date: 02/07/2020 CLINICAL DATA:  Blood in sputum, hemoptysis EXAM: CHEST - 2 VIEW COMPARISON:  September 13, 2019 FINDINGS: The heart size and mediastinal contours are within normal limits. A left-sided pacemaker again noted within the right atrium and right ventricle. Biapical centrilobular emphysematous changes are again identified with areas of architectural distortion. There is calcifications seen at the right lung base pleural. There is new hazy patchy airspace opacity seen at the right lung base. Again noted is blunting of the right costophrenic angle. The left lung remains clear. No acute osseous abnormality. IMPRESSION: Hazy patchy airspace opacity at the right lung base which could be due to inflammatory or infectious etiology. Followup PA and lateral chest X-ray is recommended in 3-4 weeks upon resolution of symptoms to ensure resolution and exclude underlying malignancy. Electronically Signed   By: September 15, 2019 M.D.   On: 02/07/2020 16:56   CT Chest Wo Contrast  Result Date: 02/08/2020 CLINICAL DATA:  Hemoptysis, increased shortness of breath, pulmonary fibrosis. EXAM: CT CHEST WITHOUT CONTRAST TECHNIQUE: Multidetector CT imaging of the chest was performed following the standard protocol without IV contrast. COMPARISON:  09/13/2019, 05/05/2019, 04/02/2018. FINDINGS: Cardiovascular: Atherosclerotic calcification of the aorta, aortic valve and coronary arteries. Focal fusiform dilatation of the mid transverse aorta measures 4.0 cm, stable. Pulmonic trunk is enlarged. Heart is at the upper limits of normal in size to mildly enlarged. No pericardial effusion. Mediastinum/Nodes: Mediastinal  lymph nodes are not enlarged by CT size criteria. Hilar regions are difficult to evaluate without IV contrast. No axillary adenopathy. Esophagus is grossly unremarkable. Lungs/Pleura: Centrilobular and paraseptal emphysema. Calcified pleural plaques in the right hemithorax. Pleuroparenchymal scarring in the apex of the left hemithorax. Irregular left upper lobe nodule measures 7 mm and is no longer cavitary (3/37), compared to 9 mm on 09/13/2019. Image quality is degraded by respiratory motion. New ground-glass and consolidation in the lateral right upper lobe and right lower lobe. New rounded air-fluid level in the anterior right lower lobe (3/98). Additional new ground-glass in the lingula. Nodular lesions in the left lower lobe measure up to approximately 1.4 x 1.6 cm (3/102), similar to 09/13/2019. No pleural fluid. Airway is unremarkable. Upper Abdomen: Liver is unremarkable. Stones layer in the gallbladder. Adrenal glands are unremarkable. Tiny stones in the right kidney. Low-attenuation lesion in the left kidney measures 1.2 cm, too small to characterize. Visualized portions of the spleen, pancreas, stomach  and bowel are grossly unremarkable. No upper abdominal adenopathy. Musculoskeletal: Degenerative changes in the spine. No worrisome lytic or sclerotic lesions. IMPRESSION: 1. New airspace consolidation and ground-glass in the right upper lobe, right lower lobe and lingula, findings indicative of pneumonia. Likely infected bullous lesion in the anterior right lower lobe. 2. Nodular lesions throughout the left hemithorax are generally stable from 09/13/2019, with exception of an apical left upper lobe nodule which appears slightly smaller. 3. Cholelithiasis. 4. Tiny right renal stones. 5. 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 Recommend semi-annual imaging followup by CTA or MRA and referral to cardiothoracic surgery if not already obtained. This recommendation follows 2010  ACCF/AHA/AATS/ACR/ASA/SCA/SCAI/SIR/STS/SVM Guidelines for the Diagnosis and Management of Patients With Thoracic Aortic Disease. Circulation. 2010; 121: Z610-R60. Aortic aneurysm NOS (ICD10-I71.9). 6. Aortic atherosclerosis (ICD10-I70.0). Coronary artery calcification. 7. Enlarged pulmonic trunk, indicative of pulmonary arterial hypertension. 8.  Emphysema (ICD10-J43.9). Electronically Signed   By: Leanna Battles M.D.   On: 02/08/2020 15:56     Past medical hx Past Medical History:  Diagnosis Date  . 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 Anxiety   . Aortic aneurysm (HCC) 09/02/2017  . BENIGN PROSTATIC HYPERTROPHY 11/23/2009  . Cardiomyopathy (HCC) 08/28/2016  . Chronic systolic CHF (congestive heart failure) (HCC) 09/02/2017  . COPD (chronic obstructive pulmonary disease) (HCC)   . CORONARY ARTERY DISEASE 11/23/2009  . 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 DEGENERATIVE JOINT DISEASE 11/23/2009  . DEPRESSION 11/23/2009  . FATIGUE 11/23/2009  . GAIT DISTURBANCE 12/10/2009  . HEMOPTYSIS UNSPECIFIED 05/07/2010  . High cholesterol   . HYPERTENSION 07/30/2009  . HYPOTHYROIDISM 07/30/2009  . Ischemic cardiomyopathy 09/02/2017  . 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 On home oxygen therapy    "2-3L; 24/7" (09/10/2016)  . OSA on CPAP   . PTSD (post-traumatic stress disorder) 03/10/2012  . 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 RA (rheumatoid arthritis) (HCC) 06/11/2011   "qwhere" (09/10/2016)  . RESPIRATORY FAILURE, CHRONIC 07/31/2009  . Scleritis of both eyes 03/17/2014  . Thrombocytopenia (HCC)   . 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 Type II diabetes mellitus (HCC)      Social History   Tobacco Use  . Smoking status: Former Smoker    Packs/day: 2.50    Years: 40.00    Pack years: 100.00    Types: Cigarettes, Pipe, Cigars    Quit date: 07/28/1998    Years since quitting: 21.5  . Smokeless tobacco: Never Used  Vaping Use  . Vaping Use: Never used  Substance Use Topics  . Alcohol use: No    Alcohol/week:  0.0 standard drinks  . Drug use: No    Mr.Stgermaine reports that he quit smoking about 21 years ago. His smoking use included cigarettes, pipe, and cigars. He has a 100.00 pack-year smoking history. He has never used smokeless tobacco. He reports that he does not drink alcohol and does not use drugs.  Tobacco Cessation: Former smoker, Quit 2000 with a 100 pack year smoking history  Past surgical hx, Family hx, Social hx all reviewed.  Current Outpatient Medications on File Prior to Visit  Medication Sig  . albuterol (PROVENTIL) (2.5 MG/3ML) 0.083% nebulizer solution 1 vial in nebulizer every 6 hours as needed Dx 496  . ALPRAZolam (XANAX) 0.5 MG tablet Take 0.5 mg by mouth at bedtime.   [name] apixaban (ELIQUIS) 5 MG TABS tablet Take 1 tablet (5 mg total) by mouth 2 (two) times daily.  [name] atorvastatin (LIPITOR) 40 MG tablet Take 1 tablet (40  mg total) by mouth daily.  . Carboxymethylcellul-Glycerin (LUBRICATING EYE DROPS OP) Place 1 drop into both eyes 4 (four) times daily as needed (dry eyes).   . Cholecalciferol (VITAMIN D3) 2000 units capsule Take 2,000 Units by mouth daily.   . insulin glargine (LANTUS) 100 UNIT/ML injection Inject 60 Units into the skin at bedtime.   [name] levothyroxine (SYNTHROID) 125 MCG tablet Take 125 mcg by mouth daily before breakfast.   . losartan (COZAAR) 50 MG tablet Take 1 tablet (50 mg total) by mouth daily.  . metFORMIN (GLUCOPHAGE) 500 MG tablet Take 500 mg by mouth 2 (two) times daily with a meal.  . Multiple Vitamins-Minerals (MEGA MULTIVITAMIN FOR MEN PO) Take 1 tablet by mouth daily.   . Olodaterol HCl (STRIVERDI RESPIMAT) 2.5 MCG/ACT AERS Inhale 1 puff into the lungs daily.  . OXYGEN Inhale 2 L into the lungs continuous.  . Semaglutide (OZEMPIC) 1 MG/DOSE SOPN Inject 1 mg into the skin every Friday.   . tamsulosin (FLOMAX) 0.4 MG CAPS capsule Take 1 capsule (0.4 mg total) by mouth at bedtime.  [name] tiotropium (SPIRIVA HANDIHALER) 18 MCG inhalation capsule Place 1  capsule (18 mcg total) into inhaler and inhale daily.   Current Facility-Administered Medications on File Prior to Visit  Medication  . methylPREDNISolone acetate (DEPO-MEDROL) injection 40 mg     No Known Allergies  Review Of Systems:  Constitutional:   No  weight loss, night sweats,  Fevers, chills, fatigue, or  lassitude.  HEENT:   No headaches,  Difficulty swallowing,  Tooth/dental problems, or  Sore throat,                No sneezing, itching, ear ache, nasal congestion, post nasal drip,   CV:  No chest pain,  Orthopnea, PND, swelling in lower extremities, anasarca, dizziness, palpitations, syncope.   GI  No heartburn, indigestion, abdominal pain, nausea, vomiting, diarrhea, change in bowel habits, loss of appetite, bloody stools.   Resp: + shortness of breath with exertion less at rest.  + excess mucus, + productive cough,  No non-productive cough,  + coughing up of blood.  No change in color of mucus.  No wheezing.  No chest wall deformity  Skin: no rash or lesions.  GU: no dysuria, change in color of urine, no urgency or frequency.  No flank pain, no hematuria   MS:  No joint pain or swelling.  No decreased range of motion.  No back pain.  Psych:  No change in mood or affect. No depression or anxiety.  No memory loss.   Vital Signs BP (!) 144/74 (BP Location: Right Arm, Cuff Size: Normal)   Pulse 66   Temp 98.7 F (37.1 C) (Oral)   Ht 6\' 3"  (1.905 m)   Wt 218 lb (98.9 kg)   SpO2 90% Comment: 4 liters continuous  BMI 27.25 kg/m    Physical Exam:  General- No distress,  A&Ox3, pale elderly male, wearing oxygen ENT: No sinus tenderness, TM clear, pale nasal mucosa, no oral exudate,no post nasal drip, no LAN Cardiac: S1, S2, regular rate and rhythm, no murmur Chest: No wheeze/ rales/no accessory muscle use, no nasal flaring, no sternal retractions, Diminished per bases R>L Abd.: Soft Non-tender, ND BS +, Body mass index is 27.25 kg/m. Ext: No clubbing cyanosis,  2+ BLE edema Neuro:  Physical deconditioning, MAE x 4, A&O x 3, Body mass index is 27.25 kg/m. Skin: No rashes,No lesions,  warm and dry Psych: normal mood and behavior  Assessment/Plan Hemoptysis x 1 week >> bright red dime size blood clots several times a day, Platelet Count of 37,000 Pt is on Eliquis for a fib and cardiac stent( Held 7/13 pm) Pt with history of ITP, but not currently being treated with prednisone Plan Stop Eliquis I have personally called cardiology to let them know patient is off Eliquis., and requested appointment with cards asap. Urgent referral to Hematology to address ITP>> Scheduled for 11 am 7/14 CT Chest without 7/14 Bleeding precautions Delsym Cough syrup Goal is to prevent cough while you have blood in your secretions Follow up in 1 week with Rubye Oaks NP with CBC. Once platelet count has improved and hemoptysis has resolved we will resume Eliquis. CBC Friday to evaluate platelets Seek emergency care for bleeding that does not stop  COPD Plan Continue Spiriva and Striverdi daily Rinse mouth after use Note your daily symptoms > remember "red flags" for COPD:  Increase in cough, increase in sputum production, increase in shortness of breath or activity intolerance. If you notice these symptoms, please call to be seen.    Oxygen Dependent Respiratory Failure  Sats were 83% on 2 L today on arrival Sats increased to 98% immediately with Head probe and increase in oxygen to 4L Plan Saturation goal is > 83% If you are unable to maintain sats > 88%, please seek emergency care   Pneumoniia Hazy patchy airspace opacity at the right lung base Plan CT Chest today We will call you with results Azithromycin 500 mg today, and 250 mg daily x 4 days Call the office to be seen  if you get worse not better, or seek emergency care Consider swallow evaluation Follow up in 1 week with Tammy Parrett NP  Worsening Lower extremity edema Concern for  worsening heart failure CXR does not show overload Plan Request to cardiology for urgent appointment Followed by Dr. Jens Som Plan BMET today to assess renal function Consider low dose  Lasix as renal function allows  OSA on CPAP Plan Wear your CPAP every night with oxygen as you have been doing. Goal is to wear for at least 6 hours each night.   Pulmonary Nodules in a former smoker with 100 pack year smoking history Need to rule out as source of hemoptysis Plan CT Chest without contrast 7/14 Will need annual follow up  Atrial Fibrillation On Eliquis Plan Cardiology notified Eliquis on hold for hemoptysis Will resume therapy once platelets are stable and hemoptysis has stopped.  Bevelyn Ngo, NP 02/09/2020  5:03 PM  This appointment was 60 min long with over 50% of the time in direct face-to-face patient care, assessment, plan of care, and follow-up.   Addendum CT Chest 7/14 New airspace consolidation and ground-glass in the right upper lobe, right lower lobe and lingula, findings indicative of pneumonia. Likely infected bullous lesion in the anterior right lower lobe. Nodular lesions throughout the left hemithorax are generally stable from 09/13/2019, with exception of an apical left upper lobe nodule which appears slightly smaller. Cholelithiasis. Tiny right renal stones. Enlarged pulmonic trunk, indicative of pulmonary arterial Hypertension.  CBC Latest Ref Rng & Units 02/08/2020 02/07/2020 11/11/2019  WBC 4.0 - 10.5 K/uL 6.2 5.4 6.4  Hemoglobin 13.0 - 17.0 g/dL 10.9(L) 10.8(L) 11.1(L)  Hematocrit 39 - 52 % 32.7(L) 32.9(L) 35.8(L)  Platelets 150 - 400 K/uL 42.0 Repeated and verified X2.(LL) 37.0 Repeated and verified X2.(LL) 61(L)   Results for AMARTYA, ASLINGER (MRN [id number]) as of 02/09/2020 18:04  Ref.  Range 02/08/2020 10:31  Sodium Latest Ref Range: 135 - 145 mEq/L 139  Potassium Latest Ref Range: 3.5 - 5.1 mEq/L 4.2  Chloride Latest Ref Range: 96 - 112  mEq/L 103  CO2 Latest Ref Range: 19 - 32 mEq/L 28  Glucose Latest Ref Range: 70 - 99 mg/dL 258 (H)  BUN Latest Ref Range: 6 - 23 mg/dL 20  Creatinine Latest Ref Range: 0.40 - 1.50 mg/dL 5.27  Calcium Latest Ref Range: 8.4 - 10.5 mg/dL 9.2  GFR Latest Ref Range: >60.00 mL/min 59.05 (L)    Bevelyn Ngo, MSN, AGACNP-BC [facility] See Amion for personal pager PCCM on call pager [phone]

## 2020-02-07 NOTE — Telephone Encounter (Signed)
Can you re-order as stat priority so it will be resulted

## 2020-02-07 NOTE — Addendum Note (Signed)
Addended by: Vianne Bulls R on: 02/07/2020 03:50 PM   Modules accepted: Orders

## 2020-02-08 ENCOUNTER — Ambulatory Visit: Payer: Medicare PPO | Admitting: Acute Care

## 2020-02-08 ENCOUNTER — Ambulatory Visit (HOSPITAL_BASED_OUTPATIENT_CLINIC_OR_DEPARTMENT_OTHER)
Admission: RE | Admit: 2020-02-08 | Discharge: 2020-02-08 | Disposition: A | Discharge status: Home / Self Care | Payer: Medicare PPO | Source: Ambulatory Visit | Attending: Acute Care | Admitting: Acute Care

## 2020-02-08 ENCOUNTER — Telehealth: Payer: Self-pay | Admitting: Cardiology

## 2020-02-08 ENCOUNTER — Other Ambulatory Visit: Payer: Self-pay

## 2020-02-08 ENCOUNTER — Telehealth: Payer: Self-pay | Admitting: Acute Care

## 2020-02-08 ENCOUNTER — Encounter: Payer: Self-pay | Admitting: Acute Care

## 2020-02-08 ENCOUNTER — Inpatient Hospital Stay: Payer: Medicare PPO | Attending: Hematology and Oncology | Admitting: Hematology and Oncology

## 2020-02-08 VITALS — BP 144/74 | HR 66 | Temp 98.7°F | Ht 75.0 in | Wt 218.0 lb

## 2020-02-08 DIAGNOSIS — R042 Hemoptysis: Secondary | ICD-10-CM | POA: Diagnosis not present

## 2020-02-08 DIAGNOSIS — J181 Lobar pneumonia, unspecified organism: Secondary | ICD-10-CM | POA: Diagnosis not present

## 2020-02-08 DIAGNOSIS — R918 Other nonspecific abnormal finding of lung field: Secondary | ICD-10-CM

## 2020-02-08 DIAGNOSIS — I712 Thoracic aortic aneurysm, without rupture: Secondary | ICD-10-CM | POA: Diagnosis not present

## 2020-02-08 DIAGNOSIS — I2510 Atherosclerotic heart disease of native coronary artery without angina pectoris: Secondary | ICD-10-CM | POA: Diagnosis not present

## 2020-02-08 DIAGNOSIS — E119 Type 2 diabetes mellitus without complications: Secondary | ICD-10-CM | POA: Insufficient documentation

## 2020-02-08 DIAGNOSIS — I509 Heart failure, unspecified: Secondary | ICD-10-CM

## 2020-02-08 DIAGNOSIS — J449 Chronic obstructive pulmonary disease, unspecified: Secondary | ICD-10-CM

## 2020-02-08 DIAGNOSIS — I4891 Unspecified atrial fibrillation: Secondary | ICD-10-CM

## 2020-02-08 DIAGNOSIS — Z7901 Long term (current) use of anticoagulants: Secondary | ICD-10-CM | POA: Insufficient documentation

## 2020-02-08 DIAGNOSIS — Z79899 Other long term (current) drug therapy: Secondary | ICD-10-CM | POA: Diagnosis not present

## 2020-02-08 DIAGNOSIS — G4733 Obstructive sleep apnea (adult) (pediatric): Secondary | ICD-10-CM

## 2020-02-08 DIAGNOSIS — D693 Immune thrombocytopenic purpura: Secondary | ICD-10-CM | POA: Diagnosis not present

## 2020-02-08 DIAGNOSIS — Z794 Long term (current) use of insulin: Secondary | ICD-10-CM | POA: Diagnosis not present

## 2020-02-08 DIAGNOSIS — R609 Edema, unspecified: Secondary | ICD-10-CM

## 2020-02-08 DIAGNOSIS — J9611 Chronic respiratory failure with hypoxia: Secondary | ICD-10-CM

## 2020-02-08 DIAGNOSIS — Z9989 Dependence on other enabling machines and devices: Secondary | ICD-10-CM

## 2020-02-08 DIAGNOSIS — I700 Atherosclerosis of aorta: Secondary | ICD-10-CM | POA: Diagnosis not present

## 2020-02-08 DIAGNOSIS — D696 Thrombocytopenia, unspecified: Secondary | ICD-10-CM

## 2020-02-08 LAB — CBC WITH DIFFERENTIAL/PLATELET
Basophils Absolute: 0 10*3/uL (ref 0.0–0.1)
Basophils Relative: 0.2 % (ref 0.0–3.0)
Eosinophils Absolute: 0 10*3/uL (ref 0.0–0.7)
Eosinophils Relative: 0.3 % (ref 0.0–5.0)
HCT: 32.7 % — ABNORMAL LOW (ref 39.0–52.0)
Hemoglobin: 10.9 g/dL — ABNORMAL LOW (ref 13.0–17.0)
Lymphocytes Relative: 21 % (ref 12.0–46.0)
Lymphs Abs: 1.3 10*3/uL (ref 0.7–4.0)
MCHC: 33.3 g/dL (ref 30.0–36.0)
MCV: 81.5 fl (ref 78.0–100.0)
Monocytes Absolute: 0.7 10*3/uL (ref 0.1–1.0)
Monocytes Relative: 11.1 % (ref 3.0–12.0)
Neutro Abs: 4.2 10*3/uL (ref 1.4–7.7)
Neutrophils Relative %: 67.4 % (ref 43.0–77.0)
Platelets: 42 10*3/uL — CL (ref 150.0–400.0)
RBC: 4.01 Mil/uL — ABNORMAL LOW (ref 4.22–5.81)
RDW: 15.3 % (ref 11.5–15.5)
WBC: 6.2 10*3/uL (ref 4.0–10.5)

## 2020-02-08 LAB — BASIC METABOLIC PANEL
BUN: 20 mg/dL (ref 6–23)
CO2: 28 mEq/L (ref 19–32)
Calcium: 9.2 mg/dL (ref 8.4–10.5)
Chloride: 103 mEq/L (ref 96–112)
Creatinine, Ser: 1.19 mg/dL (ref 0.40–1.50)
GFR: 59.05 mL/min — ABNORMAL LOW (ref >60.00)
Glucose, Bld: 111 mg/dL — ABNORMAL HIGH (ref 70–99)
Potassium: 4.2 mEq/L (ref 3.5–5.1)
Sodium: 139 mEq/L (ref 135–145)

## 2020-02-08 MED ORDER — AZITHROMYCIN 250 MG PO TABS: 250.0000 mg | ORAL_TABLET | Freq: Every day | ORAL | 0 refills | Status: DC

## 2020-02-08 MED ORDER — FUROSEMIDE 40 MG PO TABS: 40.0000 mg | ORAL_TABLET | Freq: Every day | ORAL | 3 refills | Status: DC

## 2020-02-08 MED ORDER — PREDNISONE 20 MG PO TABS: 40.0000 mg | ORAL_TABLET | Freq: Every day | ORAL | 0 refills | Status: DC

## 2020-02-08 NOTE — Patient Instructions (Addendum)
It is good to see you today. We will call your hematologist today, to see if they can see you today. Stop Eliquis for now, until we can get your platelet count up. Follow up with your hematologist for an urgent OV. We will send a message to cardiology for an urgent OV for your new lower extremity swelling.  We will check labs today ( BMET) We will order a CT Chest without contrast today at [facility]. We will place an order for a CBC on Friday to re-check your platelets We will send in a prescription for a z pack. Take as directed, until gone.  Continue Delsym cough syrup. We want to prevent cough Continue to take your inhalers as you have been doing.  If you start coughing up more blood, please seek emergency care in the ED.  Call 911 for help if you need it.  Please contact office for sooner follow up if symptoms do not improve or worsen or seek emergency care  Follow up with Maralyn Sago NP or Rubye Oaks NP in 1 week.  Call if you need [place] sooner.

## 2020-02-08 NOTE — Telephone Encounter (Signed)
These results have been discussed with the patient. He has been advised to stop his eliquis due to hemoptysis. He has a CT chest ordered, and we have made an appointment with his hematologist at 11 am today to be seen. Follow up CBC on Friday

## 2020-02-08 NOTE — Assessment & Plan Note (Signed)
Relapsed acute ITP Prior treatment: Prednisone started 07/05/2019 completed 10/23/2019 Lab review: Platelet count 37 with blood with expectoration  Presenting symptom: Hemoptysis: CT chest is being performed today. Treatment plan: Restart prednisone at 40 mg daily. Return to clinic in 10 days with recheck of labs and follow-up.

## 2020-02-08 NOTE — Telephone Encounter (Signed)
Spoke with Clydie Braun at [facility]. They repeated the pt's platelet count again this morning since it was 37 yesterday. Today his platelet count is at 42.  Will route to Maralyn Sago as she is in the office today.

## 2020-02-08 NOTE — Progress Notes (Signed)
Patient Care Team: Myrlene Broker, MD as PCP - General (Internal Medicine) Ernesto Rutherford, MD as Consulting Physician (Ophthalmology) Kalman Shan, MD as Consulting Physician (Pulmonary Disease) System, Provider Not In as Consulting Physician Casimer Lanius, MD as Consulting Physician (Rheumatology) Oretha Milch, MD as Consulting Physician (Pulmonary Disease) Lewayne Bunting, MD as Consulting Physician (Cardiology)  DIAGNOSIS:  Encounter Diagnosis  Name Primary?   Acute ITP (HCC)     CHIEF COMPLIANT: Hemoptysis  INTERVAL HISTORY: [name] is a 79 year old gentleman who had ITP and we treated him with prednisone with improvement in his platelet count up to 91 and then it slowly declined to 61 in April 2021 more recently it had come down to 39 and he started developing hemoptysis.  He is scheduled to undergo CT chest today.   ALLERGIES:  has No Known Allergies.  MEDICATIONS:  Current Outpatient Medications  Medication Sig Dispense Refill   albuterol (PROVENTIL) (2.5 MG/3ML) 0.083% nebulizer solution 1 vial in nebulizer every 6 hours as needed Dx 496 120 mL 6   ALPRAZolam (XANAX) 0.5 MG tablet Take 0.5 mg by mouth at bedtime.      apixaban (ELIQUIS) 5 MG TABS tablet Take 1 tablet (5 mg total) by mouth 2 (two) times daily. 180 tablet 2   atorvastatin (LIPITOR) 40 MG tablet Take 1 tablet (40 mg total) by mouth daily. 90 tablet 3   azithromycin (ZITHROMAX) 250 MG tablet Take 1 tablet (250 mg total) by mouth daily. 2 tabs on day one, then one a day until gone 6 tablet 0   Carboxymethylcellul-Glycerin (LUBRICATING EYE DROPS OP) Place 1 drop into both eyes 4 (four) times daily as needed (dry eyes).      Cholecalciferol (VITAMIN D3) 2000 units capsule Take 2,000 Units by mouth daily.      insulin glargine (LANTUS) 100 UNIT/ML injection Inject 60 Units into the skin at bedtime.      levothyroxine (SYNTHROID) 125 MCG tablet Take 125 mcg by mouth daily  before breakfast.      losartan (COZAAR) 50 MG tablet Take 1 tablet (50 mg total) by mouth daily. 90 tablet 3   metFORMIN (GLUCOPHAGE) 500 MG tablet Take 500 mg by mouth 2 (two) times daily with a meal.     Multiple Vitamins-Minerals (MEGA MULTIVITAMIN FOR MEN PO) Take 1 tablet by mouth daily.      Olodaterol HCl (STRIVERDI RESPIMAT) 2.5 MCG/ACT AERS Inhale 1 puff into the lungs daily.     OXYGEN Inhale 2 L into the lungs continuous.     predniSONE (DELTASONE) 20 MG tablet Take 2 tablets (40 mg total) by mouth daily with breakfast. 100 tablet 0   Semaglutide (OZEMPIC) 1 MG/DOSE SOPN Inject 1 mg into the skin every Friday.      tamsulosin (FLOMAX) 0.4 MG CAPS capsule Take 1 capsule (0.4 mg total) by mouth at bedtime. 30 capsule 0   tiotropium (SPIRIVA HANDIHALER) 18 MCG inhalation capsule Place 1 capsule (18 mcg total) into inhaler and inhale daily. 30 capsule 4   Current Facility-Administered Medications  Medication Dose Route Frequency Provider Last Rate Last Admin   methylPREDNISolone acetate (DEPO-MEDROL) injection 40 mg  40 mg Other Once Tyrell Antonio, MD        PHYSICAL EXAMINATION: ECOG PERFORMANCE STATUS: 1 - Symptomatic but completely ambulatory  Vitals:   02/08/20 1115  BP: (!) 161/86  Pulse: 72  Resp: (!) 21  Temp: 98.9 F (37.2 C)  SpO2: 100%   Filed Weights  02/08/20 1115  Weight: 221 lb 3.2 oz (100.3 kg)    LABORATORY DATA:  I have reviewed the data as listed CMP Latest Ref Rng & Units 02/08/2020 06/28/2019 03/15/2019  Glucose 70 - 99 mg/dL 919(T) 660(A) 004(H)  BUN 6 - 23 mg/dL 20 18 99(H)  Creatinine 0.40 - 1.50 mg/dL 7.41 4.23(T) 5.32(Y)  Sodium 135 - 145 mEq/L 139 140 135  Potassium 3.5 - 5.1 mEq/L 4.2 5.2 4.4  Chloride 96 - 112 mEq/L 103 102 101  CO2 19 - 32 mEq/L 28 25 23   Calcium 8.4 - 10.5 mg/dL 9.2 9.1 9.3  Total Protein 6.5 - 8.1 g/dL - - -  Total Bilirubin 0.3 - 1.2 mg/dL - - -  Alkaline Phos 38 - 126 U/L - - -  AST 15 - 41 U/L - - -   ALT 0 - 44 U/L - - -    Lab Results  Component Value Date   WBC 6.2 02/08/2020   HGB 10.9 (L) 02/08/2020   HCT 32.7 (L) 02/08/2020   MCV 81.5 02/08/2020   PLT 42.0 Repeated and verified X2. (LL) 02/08/2020   NEUTROABS 4.2 02/08/2020    ASSESSMENT & PLAN:  Acute ITP (HCC) Relapsed acute ITP Prior treatment: Prednisone started 07/05/2019 completed 10/23/2019 Lab review: Platelet count 37 with blood with expectoration  Presenting symptom: Hemoptysis: CT chest is being performed today. Treatment plan: Restart prednisone at 40 mg daily. Return to clinic in 10 days with recheck of labs and follow-up.      No orders of the defined types were placed in this encounter.  The patient has a good understanding of the overall plan. he agrees with it. he will call with any problems that may develop before the next visit here. Total time spent: 30 mins including face to face time and time spent for planning, charting and co-ordination of care   10/25/2019, MD 02/08/20

## 2020-02-08 NOTE — Telephone Encounter (Signed)
Pt. Was seen by hematology at 11 am for this. The patient is aware his platelets are low and that he needs to hold Eliquis and use bleeding precautions. He knows to seek emergency care for bleeding that he cannot control easily.

## 2020-02-08 NOTE — Telephone Encounter (Signed)
°  Pt c/o medication issue:  1. Name of Medication: apixaban (ELIQUIS) 5 MG TABS tablet  2. How are you currently taking this medication (dosage and times per day)?   3. Are you having a reaction (difficulty breathing--STAT)?  Na  4. What is your medication issue? Patient's PCP took him off of the Eliquis because his platelets were down to 30 and he has been throwing up blood. He was told by PCP to let Dr Jens Som know about this and see what he thought.   Pt c/o swelling: STAT is pt has developed SOB within 24 hours  1) How much weight have you gained and in what time span? Gained 6 pounds, a week or so  2) If swelling, where is the swelling located? Legs, ankles, feet  3) Are you currently taking a fluid pill? No  4) Are you currently SOB? Yes, for about a week or so  5) Do you have a log of your daily weights (if so, list)? no  6) Have you gained 3 pounds in a day or 5 pounds in a week? yes  7) Have you traveled recently? No  Patient would like something for the swelling. Patient has to go have a CT at 3:00 today.

## 2020-02-08 NOTE — Telephone Encounter (Signed)
Pt called to report that his Oncologist and pulmonary MD's have agreed to have him d/c his Eliquis for now... pt has very low platelet count and has been coughing up large amounts of bloody sputum.   He was put on a Zpak and he is having a chest CT this afternoon at the [facility] med center.   Pt notes that for the past several days when he started having the cough... his lower extremities bilaterally have been swollen up to his knees... he can only where is slippers. He was told by pulmonary to call Dr. Jens Som about getting some "fluid" pills prescribed.   He has been Sob but has been told he may have an infection in his lungs.   Pt denies that he has had more added NA in his diet recently.   Advised pt that I will forward to Dr. Jens Som per his request for his review and recommendations.

## 2020-02-08 NOTE — Telephone Encounter (Signed)
Add Lasix 40 mg daily.  Check potassium and renal function in 1 week. Olga Millers

## 2020-02-08 NOTE — Telephone Encounter (Signed)
Left message for patient with Dr Creshaw's recommendations.  New script sent to the pharmacy and Lab orders mailed to the pt  

## 2020-02-09 ENCOUNTER — Encounter: Payer: Self-pay | Admitting: Acute Care

## 2020-02-09 ENCOUNTER — Telehealth: Payer: Self-pay | Admitting: Cardiology

## 2020-02-09 ENCOUNTER — Telehealth: Payer: Self-pay | Admitting: Hematology and Oncology

## 2020-02-09 NOTE — Progress Notes (Signed)
These results were discussed with the patient at an OV 7/14. He was sent to hematology same day.Eliquis has been stopped.

## 2020-02-09 NOTE — Progress Notes (Signed)
These results were shared with the patient by hematology on 7/14

## 2020-02-09 NOTE — Telephone Encounter (Signed)
New message   Per Maralyn Sago wants to speak to someone in triage to see if she can get an earlier appt for this patient because patis having some issues with medication.

## 2020-02-09 NOTE — Progress Notes (Signed)
Please call patient and let him know his CT confirms pneumonia. Please tell him to complete the antibiotic as prescribed and follow up with Tammy as is scheduled. Let him know I have called cardiology to see if they can get him in to be seen sooner. Thanks so much

## 2020-02-09 NOTE — Telephone Encounter (Signed)
Scheduled per 7/14 los. Called and spoke with pt, confirmed 7/21 appt

## 2020-02-09 NOTE — Telephone Encounter (Signed)
Spoke with Kandice Robinsons NP from pulmonary re pt and Eliquis has been held last dose was taken on 02/07/20 am dose as pt has been coughing up dime size clots and platelets are low 42.Pt also has pneumonia and lung nodules pt is very SOB as well Maralyn Sago is going to see pt in a week with repeat labs just wanted to make sure Dr Jens Som is aware of pt holding Eliquis  Pt also is going to see Hematology in the future re abnormal labs Pt has an appt 02/27/20 with Dr Jens Som will forward to Dr Jens Som for review and if needs an earlier appt ./cy

## 2020-02-10 ENCOUNTER — Other Ambulatory Visit (INDEPENDENT_AMBULATORY_CARE_PROVIDER_SITE_OTHER): Payer: Medicare PPO

## 2020-02-10 DIAGNOSIS — D649 Anemia, unspecified: Secondary | ICD-10-CM

## 2020-02-10 LAB — CBC WITH DIFFERENTIAL/PLATELET
Basophils Absolute: 0 10*3/uL (ref 0.0–0.1)
Basophils Relative: 0.3 % (ref 0.0–3.0)
Eosinophils Absolute: 0 10*3/uL (ref 0.0–0.7)
Eosinophils Relative: 0.5 % (ref 0.0–5.0)
HCT: 33.5 % — ABNORMAL LOW (ref 39.0–52.0)
Hemoglobin: 11.2 g/dL — ABNORMAL LOW (ref 13.0–17.0)
Lymphocytes Relative: 32.1 % (ref 12.0–46.0)
Lymphs Abs: 2.5 10*3/uL (ref 0.7–4.0)
MCHC: 33.3 g/dL (ref 30.0–36.0)
MCV: 81.5 fl (ref 78.0–100.0)
Monocytes Absolute: 0.8 10*3/uL (ref 0.1–1.0)
Monocytes Relative: 10.1 % (ref 3.0–12.0)
Neutro Abs: 4.4 10*3/uL (ref 1.4–7.7)
Neutrophils Relative %: 57 % (ref 43.0–77.0)
Platelets: 71 10*3/uL — ABNORMAL LOW (ref 150.0–400.0)
RBC: 4.11 Mil/uL — ABNORMAL LOW (ref 4.22–5.81)
RDW: 15.3 % (ref 11.5–15.5)
WBC: 7.8 10*3/uL (ref 4.0–10.5)

## 2020-02-11 ENCOUNTER — Telehealth: Payer: Self-pay | Admitting: Acute Care

## 2020-02-11 NOTE — Telephone Encounter (Signed)
I attempted to call the patient regarding when to resume his Eliquis. There was no answer. I have left a HIPPA compliant message requesting the patient call the office Monday Morning. As his CHADS score is 6 , and his platelet count has rebounded to 91,000 on prednisone,  I need to confirm the patient has had no further hemoptysis prior to re-starting his Eliquis. If the patient has had no further hemoptysis, we will resume his Eliquis at 5 mg twice daily as he was taking prior to his critical thrombocytopenia and hemoptysis.   Triage, please call patient early Monday morning, and see if he has had any further hemoptysis.We cannot resume blood thinner until we confirm with the patient that he has had no further bleeding.  Please send me a Telephone message  ( Pt. Call message) so that I can determine if we can resume Eliquis.    Thanks so much  [place] Vasc Score =6 Creatinine Clearance = 71.51 Has-bled score =2 Annual risk of stroke on Eliquis 5 mg BID is 3.3 % Relative Risk Reduction on medication = 74%  Absolute Risk reduction on medication is 9.2% Chance Benefit / year is 1 in 66 These calculations calculated using Anti-coagulation calculator [employer].

## 2020-02-12 NOTE — Telephone Encounter (Signed)
Agree with holding apixaban in setting of hemoptysis [name]

## 2020-02-13 NOTE — Telephone Encounter (Signed)
Pt.platelets are 71,000. I had erroneously stated 91,000 in my previous encounter. Pt. Has had continued hemoptysis through 7/17, 2 days ago. Please have patient get CBC today to re-check platelet count. Place order at [facility] [address]based on his preference.  We will determine when to resume Eliquis after we check platelets. Thanks so much.

## 2020-02-13 NOTE — Telephone Encounter (Signed)
Spoke with pt, Aware of dr Ludwig Clarks recommendations. He will discuss with the hematologist when it is safe for him to restart.

## 2020-02-13 NOTE — Telephone Encounter (Signed)
Called and spoke with pt to see when his last hemoptysis episode was and he stated it was two days ago 7/17 which he stated that he coughed up bloody mucus twice. Pt has not had any hemoptysis since then.  Pt stated that he still is not taking the Eliquis. Sarah, please advise.

## 2020-02-13 NOTE — Telephone Encounter (Signed)
Called and spoke with pt letting him know that we will hold off on him restarting eliquis until we have rechecked CBC. Pt is scheduled to see hematology 7/21 which SG said would be okay for pt to wait to have labwork done then. Pt is scheduled to see TP 7/22 for a 1 week follow up. We will see what pt's labwork looks like after that visit to determine if it is okay for pt to restart eliquis. Nothing further needed.

## 2020-02-14 ENCOUNTER — Other Ambulatory Visit: Payer: Self-pay | Admitting: *Deleted

## 2020-02-14 DIAGNOSIS — J449 Chronic obstructive pulmonary disease, unspecified: Secondary | ICD-10-CM | POA: Diagnosis not present

## 2020-02-14 DIAGNOSIS — D693 Immune thrombocytopenic purpura: Secondary | ICD-10-CM

## 2020-02-15 ENCOUNTER — Inpatient Hospital Stay: Payer: Medicare PPO | Admitting: Hematology and Oncology

## 2020-02-15 ENCOUNTER — Other Ambulatory Visit: Payer: Self-pay

## 2020-02-15 ENCOUNTER — Inpatient Hospital Stay: Payer: Medicare PPO

## 2020-02-15 DIAGNOSIS — Z79899 Other long term (current) drug therapy: Secondary | ICD-10-CM | POA: Diagnosis not present

## 2020-02-15 DIAGNOSIS — D693 Immune thrombocytopenic purpura: Secondary | ICD-10-CM

## 2020-02-15 DIAGNOSIS — Z794 Long term (current) use of insulin: Secondary | ICD-10-CM | POA: Diagnosis not present

## 2020-02-15 DIAGNOSIS — Z7901 Long term (current) use of anticoagulants: Secondary | ICD-10-CM | POA: Diagnosis not present

## 2020-02-15 DIAGNOSIS — E119 Type 2 diabetes mellitus without complications: Secondary | ICD-10-CM | POA: Diagnosis not present

## 2020-02-15 LAB — CBC WITH DIFFERENTIAL (CANCER CENTER ONLY)
Abs Immature Granulocytes: 0.05 10*3/uL (ref 0.00–0.07)
Basophils Absolute: 0 10*3/uL (ref 0.0–0.1)
Basophils Relative: 0 %
Eosinophils Absolute: 0.1 10*3/uL (ref 0.0–0.5)
Eosinophils Relative: 1 %
HCT: 35.2 % — ABNORMAL LOW (ref 39.0–52.0)
Hemoglobin: 11.1 g/dL — ABNORMAL LOW (ref 13.0–17.0)
Immature Granulocytes: 1 %
Lymphocytes Relative: 23 %
Lymphs Abs: 2.3 10*3/uL (ref 0.7–4.0)
MCH: 26.6 pg (ref 26.0–34.0)
MCHC: 31.5 g/dL (ref 30.0–36.0)
MCV: 84.2 fL (ref 80.0–100.0)
Monocytes Absolute: 0.9 10*3/uL (ref 0.1–1.0)
Monocytes Relative: 9 %
Neutro Abs: 6.5 10*3/uL (ref 1.7–7.7)
Neutrophils Relative %: 66 %
Platelet Count: 98 10*3/uL — ABNORMAL LOW (ref 150–400)
RBC: 4.18 MIL/uL — ABNORMAL LOW (ref 4.22–5.81)
RDW: 14.3 % (ref 11.5–15.5)
WBC Count: 9.9 10*3/uL (ref 4.0–10.5)
nRBC: 0 % (ref 0.0–0.2)

## 2020-02-15 MED ORDER — PREDNISONE 10 MG PO TABS: 30.0000 mg | ORAL_TABLET | Freq: Every day | ORAL | 0 refills | Status: DC

## 2020-02-15 NOTE — Assessment & Plan Note (Signed)
Relapsed acute ITP Prior treatment: Prednisone started 07/05/2019 completed 10/23/2019 Lab review:  02/08/2020: Platelet count 37 with blood with expectoration started on prednisone 02/15/2020:  CT chest 02/08/2020: Right upper lobe and right lower lobe groundglass consolidation suggestive of pneumonia, gallstones, kidney stones, emphysema, atherosclerosis

## 2020-02-15 NOTE — Progress Notes (Signed)
Patient Care Team: Myrlene Broker, MD as PCP - General (Internal Medicine) Ernesto Rutherford, MD as Consulting Physician (Ophthalmology) Kalman Shan, MD as Consulting Physician (Pulmonary Disease) System, Provider Not In as Consulting Physician Casimer Lanius, MD as Consulting Physician (Rheumatology) Oretha Milch, MD as Consulting Physician (Pulmonary Disease) Lewayne Bunting, MD as Consulting Physician (Cardiology)  DIAGNOSIS:    ICD-10-CM   1. Acute ITP (HCC)  D69.3     CHIEF COMPLIANT: Follow-up of Hemoptysis  INTERVAL HISTORY: [name] is a 79 y.o. with above-mentioned history of ITP currently on treatment with prednisone 40mg  daily. Chest CT on 02/08/20 showed airspace consolidation and ground-glass in the right upper lobe, right lower lobe and lingula, findings indicative of pneumonia. She presents to the clinic today for follow-up.  He is tolerating the prednisone fairly well.  He is diabetic and is watching his blood sugars carefully.  He has appointments with pulmonary tomorrow.  ALLERGIES:  has No Known Allergies.  MEDICATIONS:  Current Outpatient Medications  Medication Sig Dispense Refill  . albuterol (PROVENTIL) (2.5 MG/3ML) 0.083% nebulizer solution 1 vial in nebulizer every 6 hours as needed Dx 496 120 mL 6  . ALPRAZolam (XANAX) 0.5 MG tablet Take 0.5 mg by mouth at bedtime.     02/10/20 apixaban (ELIQUIS) 5 MG TABS tablet Take 1 tablet (5 mg total) by mouth 2 (two) times daily. 180 tablet 2  . atorvastatin (LIPITOR) 40 MG tablet Take 1 tablet (40 mg total) by mouth daily. 90 tablet 3  . azithromycin (ZITHROMAX) 250 MG tablet Take 1 tablet (250 mg total) by mouth daily. 2 tabs on day one, then one a day until gone 6 tablet 0  . Carboxymethylcellul-Glycerin (LUBRICATING EYE DROPS OP) Place 1 drop into both eyes 4 (four) times daily as needed (dry eyes).     . Cholecalciferol (VITAMIN D3) 2000 units capsule Take 2,000 Units by mouth daily.     .  furosemide (LASIX) 40 MG tablet Take 1 tablet (40 mg total) by mouth daily. 90 tablet 3  . insulin glargine (LANTUS) 100 UNIT/ML injection Inject 60 Units into the skin at bedtime.     [name] levothyroxine (SYNTHROID) 125 MCG tablet Take 125 mcg by mouth daily before breakfast.     . losartan (COZAAR) 50 MG tablet Take 1 tablet (50 mg total) by mouth daily. 90 tablet 3  . metFORMIN (GLUCOPHAGE) 500 MG tablet Take 500 mg by mouth 2 (two) times daily with a meal.    . Multiple Vitamins-Minerals (MEGA MULTIVITAMIN FOR MEN PO) Take 1 tablet by mouth daily.     . Olodaterol HCl (STRIVERDI RESPIMAT) 2.5 MCG/ACT AERS Inhale 1 puff into the lungs daily.    . OXYGEN Inhale 2 L into the lungs continuous.    . predniSONE (DELTASONE) 20 MG tablet Take 2 tablets (40 mg total) by mouth daily with breakfast. 100 tablet 0  . Semaglutide (OZEMPIC) 1 MG/DOSE SOPN Inject 1 mg into the skin every Friday.     . tamsulosin (FLOMAX) 0.4 MG CAPS capsule Take 1 capsule (0.4 mg total) by mouth at bedtime. 30 capsule 0  . tiotropium (SPIRIVA HANDIHALER) 18 MCG inhalation capsule Place 1 capsule (18 mcg total) into inhaler and inhale daily. 30 capsule 4   Current Facility-Administered Medications  Medication Dose Route Frequency Provider Last Rate Last Admin  . methylPREDNISolone acetate (DEPO-MEDROL) injection 40 mg  40 mg Other Once Wednesday, MD        PHYSICAL  EXAMINATION: ECOG PERFORMANCE STATUS: 2 - Symptomatic, <50% confined to bed  There were no vitals filed for this visit. There were no vitals filed for this visit.  LABORATORY DATA:  I have reviewed the data as listed CMP Latest Ref Rng & Units 02/08/2020 06/28/2019 03/15/2019  Glucose 70 - 99 mg/dL 162(O) 469(F) 072(U)  BUN 6 - 23 mg/dL 20 18 57(D)  Creatinine 0.40 - 1.50 mg/dL 0.51 8.33(P) 8.25(P)  Sodium 135 - 145 mEq/L 139 140 135  Potassium 3.5 - 5.1 mEq/L 4.2 5.2 4.4  Chloride 96 - 112 mEq/L 103 102 101  CO2 19 - 32 mEq/L 28 25 23   Calcium 8.4 -  10.5 mg/dL 9.2 9.1 9.3  Total Protein 6.5 - 8.1 g/dL - - -  Total Bilirubin 0.3 - 1.2 mg/dL - - -  Alkaline Phos 38 - 126 U/L - - -  AST 15 - 41 U/L - - -  ALT 0 - 44 U/L - - -    Lab Results  Component Value Date   WBC 9.9 02/15/2020   HGB 11.1 (L) 02/15/2020   HCT 35.2 (L) 02/15/2020   MCV 84.2 02/15/2020   PLT 98 (L) 02/15/2020   NEUTROABS 6.5 02/15/2020    ASSESSMENT & PLAN:  Acute ITP (HCC) Relapsed acute ITP Prior treatment: Prednisone started 07/05/2019 completed 10/23/2019 Lab review:  02/08/2020: Platelet count 37 with blood with expectoration started on prednisone 02/15/2020: Platelet count 98  CT chest 02/08/2020: Right upper lobe and right lower lobe groundglass consolidation suggestive of pneumonia, gallstones, kidney stones, emphysema, atherosclerosis  We will start tapering his prednisone dose.  He will start 30 mg a day for a week then 20 then 10 and then 5 and then stop in 4 weeks. Return to clinic in 4 weeks with labs and follow-up.  No orders of the defined types were placed in this encounter.  The patient has a good understanding of the overall plan. he agrees with it. he will call with any problems that may develop before the next visit here.  Total time spent: 20 mins including face to face time and time spent for planning, charting and coordination of care  02/10/2020, MD 02/15/2020  I, 02/17/2020 [place], am acting as scribe for Dr. Kirt Boys.  I have reviewed the above documentation for accuracy and completeness, and I agree with the above.

## 2020-02-16 ENCOUNTER — Ambulatory Visit: Payer: Medicare PPO | Admitting: Adult Health

## 2020-02-16 ENCOUNTER — Encounter: Payer: Self-pay | Admitting: Adult Health

## 2020-02-16 DIAGNOSIS — I5022 Chronic systolic (congestive) heart failure: Secondary | ICD-10-CM

## 2020-02-16 DIAGNOSIS — Z9989 Dependence on other enabling machines and devices: Secondary | ICD-10-CM

## 2020-02-16 DIAGNOSIS — R918 Other nonspecific abnormal finding of lung field: Secondary | ICD-10-CM

## 2020-02-16 DIAGNOSIS — D693 Immune thrombocytopenic purpura: Secondary | ICD-10-CM

## 2020-02-16 DIAGNOSIS — J9611 Chronic respiratory failure with hypoxia: Secondary | ICD-10-CM

## 2020-02-16 DIAGNOSIS — G4733 Obstructive sleep apnea (adult) (pediatric): Secondary | ICD-10-CM | POA: Diagnosis not present

## 2020-02-16 DIAGNOSIS — J189 Pneumonia, unspecified organism: Secondary | ICD-10-CM

## 2020-02-16 DIAGNOSIS — R042 Hemoptysis: Secondary | ICD-10-CM | POA: Diagnosis not present

## 2020-02-16 DIAGNOSIS — I480 Paroxysmal atrial fibrillation: Secondary | ICD-10-CM

## 2020-02-16 NOTE — Assessment & Plan Note (Signed)
Continue on nocturnal CPAP with oxygen 

## 2020-02-16 NOTE — Assessment & Plan Note (Signed)
Continue follow-up with cardiology as planned in 2 weeks.  On exam appears to be in regular rhythm. Patient will discussed Eliquis restart at follow-up with cardiology

## 2020-02-16 NOTE — Assessment & Plan Note (Signed)
Appears to be compensated and euvolemic on exam with no evidence of volume overload continue on current regimen and follow-up with cardiology as planned

## 2020-02-16 NOTE — Patient Instructions (Addendum)
Continue on Spiriva and Striverdi daily , rinse after use .  Continue on Oxygen 2l/m  Continue on CPAP At bedtime  With oxygen .  Wear each night for at least 6 hr each night .  Follow up with Cardiology as planned .  Remain off Eliquis for now , discuss with Cardiology when to restart.  Follow up in 4-6 weeks with Dr. Vassie Loll or Rosealynn Mateus NP with chest xray .  Please contact office for sooner follow up if symptoms do not improve or worsen or seek emergency care

## 2020-02-16 NOTE — Assessment & Plan Note (Signed)
Platelet levels are improving on steroids.  Patient is to taper steroids as directed by hematology and follow-up as planned with hematology.  Appreciate their assistance

## 2020-02-16 NOTE — Assessment & Plan Note (Signed)
Compensated on present regimen with no increased oxygen demands.  Continue on 2 L of oxygen

## 2020-02-16 NOTE — Progress Notes (Signed)
@Patient  ID: [name], male    DOB: 25-Apr-1941, 78 y.o.   MRN: [id number]  Chief Complaint  Patient presents with  . Follow-up    COPD     Referring provider: Myrlene Broker, *  HPI: 79 year old male former smoker followed for severe COPD, chronic respiratory failure on oxygen at 2 L.  Patient has obstructive sleep apnea on nocturnal CPAP.  Patient also has known lung nodules that are followed serially on CT chest. Medical history is significant for rheumatoid arthritis (no maintenance medications), diabetes, congestive heart failure (diagnosed 2018 with echo showing EF at 35 to 40%, coronary artery disease status post RCA stent in February 2018, chronic kidney disease, A. fib (previously on amiodarone stopped in August 2019) and on Eliquis. Anemia and thrombocytopenia followed by hematology periodically requiring steroids Status post pacemaker after syncopal episodes in Fall 2020 - pacemake placed in December 2020 Gets medications through the [place] system previously was in the [employer]   TEST/EVENTS :  CT was done on September 13, 2019 showed a slightly decreased cavitary left apical nodule measuring 1.0 cm.  Irregular subsolid left lower lobe pulmonary nodules are stable largest at 1.8 cm.  There were no new or enlarging pulmonary nodules.  We discussed a follow-up CT chest in 1 year.  02/16/2020 Follow up : COPD , O2 RF , OSA , Hemoptysis , ITP  Patient returns for a 1 week follow-up.  Patient was seen last visit with hemoptysis.  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  He has been following with hematology for ITP.  Previously had been on steroids.  Patient was instructed to stop his Eliquis.  Platelets had decreased to 37,000 . he was referred to hematology who restarted his prednisone.  CT chest showed new airspace consolidation and groundglass opacities in the right upper lobe right lower lobe and lingula indicative of a possible pneumonia and infected bullous lesion in  the right lower lobe.  Patient has no nodular lesions that appear stable in the left hemothorax. Patient was treated with a Z-Pak.  Since last visit patient says he is feeling much improved.  His hemoptysis has stopped he has had none for the last 2 days.  Cough and congestion have decreased and returned back to baseline.  Patient had lab work done yesterday that showed that showed platelets had increased to 98,000.  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 Patient does have some balance issues and walks with a cane.. Is on Eliquis 5 mg twice daily for previous history of A. fib.  Previously had been on amiodarone in 2019 when he was admitted with sepsis.  Patient had spontaneous conversion to normal sinus rhythm.  Amiodarone was discontinued.  patient has been on Eliquis since this time.  Previous cardiac cath in 2018 with stent placement to the RCA.  Patient did have a loop recorder prior to pacemaker implantation that identified a 5-second pause. EKG September 2020 showed normal sinus rhythm. Patient denies any chest pain orthopnea PND or increased leg swelling.  No Known Allergies    Immunization History  Administered Date(s) Administered  . Fluad Quad(high Dose 65+) 04/26/2019  . Influenza Split 04/14/2011, 04/27/2013  . Influenza Whole 05/28/2009, 04/27/2010, 03/31/2012  . Influenza, High Dose Seasonal PF 03/21/2015, 05/16/2017, 04/08/2018  . Influenza,inj,Quad PF,6+ Mos 03/17/2014, 04/04/2016  . Influenza-Unspecified  05/26/2011, 05/07/2012  . Pneumococcal Conjugate-13 09/20/2013  . Pneumococcal Polysaccharide-23 05/28/2008, 04/25/2010  . Pneumococcal-Unspecified 04/27/2013  . Td 11/23/2009  . Zoster 04/28/2009    Past Medical History:  Diagnosis Date  . 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 Anxiety   . Aortic  aneurysm (HCC) 09/02/2017  . BENIGN PROSTATIC HYPERTROPHY 11/23/2009  . Cardiomyopathy (HCC) 08/28/2016  . Chronic systolic CHF (congestive heart failure) (HCC) 09/02/2017  . COPD (chronic obstructive pulmonary disease) (HCC)   . CORONARY ARTERY DISEASE 11/23/2009  . 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 DEGENERATIVE JOINT DISEASE 11/23/2009  . DEPRESSION 11/23/2009  . FATIGUE 11/23/2009  . GAIT DISTURBANCE 12/10/2009  . HEMOPTYSIS UNSPECIFIED 05/07/2010  . High cholesterol   . HYPERTENSION 07/30/2009  . HYPOTHYROIDISM 07/30/2009  . Ischemic cardiomyopathy 09/02/2017  . 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 On home oxygen therapy    "2-3L; 24/7" (09/10/2016)  . OSA on CPAP   . PTSD (post-traumatic stress disorder) 03/10/2012  . 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 RA (rheumatoid arthritis) (HCC) 06/11/2011   "qwhere" (09/10/2016)  . RESPIRATORY FAILURE, CHRONIC 07/31/2009  . Scleritis of both eyes 03/17/2014  . Thrombocytopenia (HCC)   . 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 Type II diabetes mellitus (HCC)     Tobacco History: Social History   Tobacco Use  Smoking Status Former Smoker  . Packs/day: 2.50  . Years: 40.00  . Pack years: 100.00  . Types: Cigarettes, Pipe, Cigars  . Quit date: 07/28/1998  . Years since quitting: 21.5  Smokeless Tobacco Never Used   Counseling given: Not Answered   Outpatient Medications Prior to Visit  Medication Sig Dispense Refill  . albuterol (PROVENTIL) (2.5 MG/3ML) 0.083% nebulizer solution 1 vial in nebulizer every 6 hours as needed Dx 496 120 mL 6  . ALPRAZolam (XANAX) 0.5 MG tablet Take 0.5 mg by mouth at bedtime.     [name] atorvastatin (LIPITOR) 40 MG tablet Take 1 tablet (40 mg total) by mouth daily. 90 tablet 3  . Carboxymethylcellul-Glycerin (LUBRICATING EYE DROPS OP) Place 1 drop into both eyes 4 (four) times daily as needed (dry eyes).     . Cholecalciferol (VITAMIN D3) 2000 units capsule Take 2,000 Units by mouth daily.     . furosemide (LASIX) 40 MG tablet Take 1  tablet (40 mg total) by mouth daily. 90 tablet 3  . insulin glargine (LANTUS) 100 UNIT/ML injection Inject 60 Units into the skin at bedtime.     [name] levothyroxine (SYNTHROID) 125 MCG tablet Take 125 mcg by mouth daily before breakfast.     . losartan (COZAAR) 50 MG tablet Take 1 tablet (50 mg total) by mouth daily. 90 tablet 3  . metFORMIN (GLUCOPHAGE) 500 MG tablet Take 500 mg by mouth 2 (two) times daily with a meal.    . Multiple Vitamins-Minerals (MEGA MULTIVITAMIN FOR MEN PO) Take 1 tablet by mouth daily.     . Olodaterol HCl (STRIVERDI RESPIMAT) 2.5 MCG/ACT AERS Inhale 1 puff into the lungs daily.    . OXYGEN Inhale 2 L into the lungs continuous.    . predniSONE (DELTASONE) 10 MG tablet Take 3 tablets (30 mg total) by mouth daily with breakfast. 50 tablet 0  . Semaglutide (OZEMPIC) 1 MG/DOSE SOPN Inject 1 mg into the skin every Friday.     . tamsulosin (FLOMAX) 0.4 MG CAPS capsule Take 1 capsule (0.4 mg total) by mouth at bedtime. 30 capsule 0  . tiotropium (SPIRIVA HANDIHALER) 18 MCG inhalation capsule Place  1 capsule (18 mcg total) into inhaler and inhale daily. 30 capsule 4  . apixaban (ELIQUIS) 5 MG TABS tablet Take 1 tablet (5 mg total) by mouth 2 (two) times daily. (Patient not taking: Reported on 02/16/2020) 180 tablet 2  . azithromycin (ZITHROMAX) 250 MG tablet Take 1 tablet (250 mg total) by mouth daily. 2 tabs on day one, then one a day until gone 6 tablet 0   Facility-Administered Medications Prior to Visit  Medication Dose Route Frequency Provider Last Rate Last Admin  . methylPREDNISolone acetate (DEPO-MEDROL) injection 40 mg  40 mg Other Once Tyrell Antonio, MD         Review of Systems:   Constitutional:   No  weight loss, night sweats,  Fevers, chills,  +fatigue, or  lassitude.  HEENT:   No headaches,  Difficulty swallowing,  Tooth/dental problems, or  Sore throat,                No sneezing, itching, ear ache, nasal congestion, post nasal drip,   CV:  No chest  pain,  Orthopnea, PND, swelling in lower extremities, anasarca, dizziness, palpitations, syncope.   GI  No heartburn, indigestion, abdominal pain, nausea, vomiting, diarrhea, change in bowel habits, loss of appetite, bloody stools.   Resp:    No chest wall deformity  Skin: no rash or lesions.  GU: no dysuria, change in color of urine, no urgency or frequency.  No flank pain, no hematuria   MS:  No joint pain or swelling.  No decreased range of motion.  No back pain.    Physical Exam  BP 118/74 (BP Location: Left Arm, Cuff Size: Normal)   Pulse (!) 55   Temp 97.8 F (36.6 C) (Oral)   Ht 6\' 3"  (1.905 m)   Wt 211 lb 6.4 oz (95.9 kg)   SpO2 94% Comment: 2lpm cont o2  BMI 26.42 kg/m   GEN: A/Ox3; pleasant , NAD, well nourished , elderly chronically ill-appearing on oxygen   HEENT:  [place]/AT,   NOSE-clear, THROAT-clear, no lesions, no postnasal drip or exudate noted.   NECK:  Supple w/ fair ROM; no JVD; normal carotid impulses w/o bruits; no thyromegaly or nodules palpated; no lymphadenopathy.    RESP  Clear  P & A; w/o, wheezes/ rales/ or rhonchi. no accessory muscle use, no dullness to percussion  CARD:  RRR, no m/r/g, no peripheral edema, pulses intact, no cyanosis or clubbing.  GI:   Soft & nt; nml bowel sounds; no organomegaly or masses detected.   Musco: Warm bil, no deformities or joint swelling noted.   Neuro: alert, no focal deficits noted.    Skin: Warm, no lesions or rashes    Lab Results:  CBC    Component Value Date/Time   WBC 9.9 02/15/2020 1110   WBC 7.8 02/10/2020 0851   RBC 4.18 (L) 02/15/2020 1110   HGB 11.1 (L) 02/15/2020 1110   HGB 12.0 (L) 06/28/2019 1453   HCT 35.2 (L) 02/15/2020 1110   HCT 38.4 06/28/2019 1453   PLT 98 (L) 02/15/2020 1110   PLT 44 (LL) 06/28/2019 1453   MCV 84.2 02/15/2020 1110   MCV 89 06/28/2019 1453   MCH 26.6 02/15/2020 1110   MCHC 31.5 02/15/2020 1110   RDW 14.3 02/15/2020 1110   RDW 13.8 06/28/2019 1453   LYMPHSABS  2.3 02/15/2020 1110   LYMPHSABS 1.2 12/23/2016 1053   MONOABS 0.9 02/15/2020 1110   EOSABS 0.1 02/15/2020 1110   EOSABS 0.3 12/23/2016  1053   BASOSABS 0.0 02/15/2020 1110   BASOSABS 0.0 12/23/2016 1053    BMET    Component Value Date/Time   NA 139 02/08/2020 1031   NA 140 06/28/2019 1453   K 4.2 02/08/2020 1031   CL 103 02/08/2020 1031   CO2 28 02/08/2020 1031   GLUCOSE 111 (H) 02/08/2020 1031   BUN 20 02/08/2020 1031   BUN 18 06/28/2019 1453   CREATININE 1.19 02/08/2020 1031   CREATININE 1.12 07/15/2018 1059   CALCIUM 9.2 02/08/2020 1031   GFRNONAA 47 (L) 06/28/2019 1453   GFRNONAA >60 07/15/2018 1059   GFRAA 55 (L) 06/28/2019 1453   GFRAA >60 07/15/2018 1059    BNP undue No results found for: BNP  ProBNP    Component Value Date/Time   PROBNP 188 10/20/2016 1038   PROBNP 59.9 05/04/2011 0517    Imaging: DG Chest 2 View  Result Date: 02/07/2020 CLINICAL DATA:  Blood in sputum, hemoptysis EXAM: CHEST - 2 VIEW COMPARISON:  September 13, 2019 FINDINGS: The heart size and mediastinal contours are within normal limits. A left-sided pacemaker again noted within the right atrium and right ventricle. Biapical centrilobular emphysematous changes are again identified with areas of architectural distortion. There is calcifications seen at the right lung base pleural. There is new hazy patchy airspace opacity seen at the right lung base. Again noted is blunting of the right costophrenic angle. The left lung remains clear. No acute osseous abnormality. IMPRESSION: Hazy patchy airspace opacity at the right lung base which could be due to inflammatory or infectious etiology. Followup PA and lateral chest X-ray is recommended in 3-4 weeks upon resolution of symptoms to ensure resolution and exclude underlying malignancy. Electronically Signed   By: Jonna Clark M.D.   On: 02/07/2020 16:56   CT Chest Wo Contrast  Result Date: 02/08/2020 CLINICAL DATA:  Hemoptysis, increased shortness of  breath, pulmonary fibrosis. EXAM: CT CHEST WITHOUT CONTRAST TECHNIQUE: Multidetector CT imaging of the chest was performed following the standard protocol without IV contrast. COMPARISON:  09/13/2019, 05/05/2019, 04/02/2018. FINDINGS: Cardiovascular: Atherosclerotic calcification of the aorta, aortic valve and coronary arteries. Focal fusiform dilatation of the mid transverse aorta measures 4.0 cm, stable. Pulmonic trunk is enlarged. Heart is at the upper limits of normal in size to mildly enlarged. No pericardial effusion. Mediastinum/Nodes: Mediastinal lymph nodes are not enlarged by CT size criteria. Hilar regions are difficult to evaluate without IV contrast. No axillary adenopathy. Esophagus is grossly unremarkable. Lungs/Pleura: Centrilobular and paraseptal emphysema. Calcified pleural plaques in the right hemithorax. Pleuroparenchymal scarring in the apex of the left hemithorax. Irregular left upper lobe nodule measures 7 mm and is no longer cavitary (3/37), compared to 9 mm on 09/13/2019. Image quality is degraded by respiratory motion. New ground-glass and consolidation in the lateral right upper lobe and right lower lobe. New rounded air-fluid level in the anterior right lower lobe (3/98). Additional new ground-glass in the lingula. Nodular lesions in the left lower lobe measure up to approximately 1.4 x 1.6 cm (3/102), similar to 09/13/2019. No pleural fluid. Airway is unremarkable. Upper Abdomen: Liver is unremarkable. Stones layer in the gallbladder. Adrenal glands are unremarkable. Tiny stones in the right kidney. Low-attenuation lesion in the left kidney measures 1.2 cm, too small to characterize. Visualized portions of the spleen, pancreas, stomach and bowel are grossly unremarkable. No upper abdominal adenopathy. Musculoskeletal: Degenerative changes in the spine. No worrisome lytic or sclerotic lesions. IMPRESSION: 1. New airspace consolidation and ground-glass in the right upper  lobe, right lower  lobe and lingula, findings indicative of pneumonia. Likely infected bullous lesion in the anterior right lower lobe. 2. Nodular lesions throughout the left hemithorax are generally stable from 09/13/2019, with exception of an apical left upper lobe nodule which appears slightly smaller. 3. Cholelithiasis. 4. Tiny right renal stones. 5. 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 Recommend semi-annual imaging followup by CTA or MRA and referral to cardiothoracic surgery if not already obtained. This recommendation follows 2010 ACCF/AHA/AATS/ACR/ASA/SCA/SCAI/SIR/STS/SVM Guidelines for the Diagnosis and Management of Patients With Thoracic Aortic Disease. Circulation. 2010; 121: N562-Z30. Aortic aneurysm NOS (ICD10-I71.9). 6. Aortic atherosclerosis (ICD10-I70.0). Coronary artery calcification. 7. Enlarged pulmonic trunk, indicative of pulmonary arterial hypertension. 8.  Emphysema (ICD10-J43.9). Electronically Signed   By: Leanna Battles M.D.   On: 02/08/2020 15:56      PFT Results Latest Ref Rng & Units 11/16/2015 12/30/2013  FVC-Pre L 2.96 3.01  FVC-Predicted Pre % 59 59  FVC-Post L 2.89 3.01  FVC-Predicted Post % 58 59  Pre FEV1/FVC % % 43 45  Post FEV1/FCV % % 46 49  FEV1-Pre L 1.28 1.36  FEV1-Predicted Pre % 35 36  FEV1-Post L 1.33 1.48  DLCO UNC% % 26 26  DLCO COR %Predicted % 40 39  TLC L - 6.31  TLC % Predicted % - 80  RV % Predicted % - 80    No results found for: NITRICOXIDE      Assessment & Plan:   Hemoptysis Recent episodes of hemoptysis in the setting of acute relapsed acute ITP, anticoagulation, probable pneumonia, COPD. Patient is significantly improved over the last week.  Platelet levels have responded well to prednisone.  He is up to 98,000. Clinically pneumonia seems to be improving after antibiotics.  Hemoptysis has resolved with no recurrence over the last 48 hours.  Patient has a follow-up appointment with cardiology in less than 2 weeks.  For now will  hold on restarting Eliquis.  Could consider restarting when seen by cardiology if they approved and or lower dose as patient is higher risk with ITP, age with balance issues.    Plan  Patient Instructions  Continue on Spiriva and Striverdi daily , rinse after use .  Continue on Oxygen 2l/m  Continue on CPAP At bedtime  With oxygen .  Wear each night for at least 6 hr each night .  Follow up with Cardiology as planned .  Remain off Eliquis for now , discuss with Cardiology when to restart.  Follow up in 4-6 weeks with Dr. Vassie Loll or Anjolina Byrer NP with chest xray .  Please contact office for sooner follow up if symptoms do not improve or worsen or seek emergency care       Pneumonia Community-acquired pneumonia with associated hemoptysis.  Patient has known nodularity that we have been following serially on CT scan that did appear to be stable however now he has superimposed consolidation most consistent with an acute infection.  Patient is clinically better after antibiotics.  We will recheck chest x-ray in 4 to 6 weeks.  At that time will consider when next serial CT chest needs to be set up.  Plan  Patient Instructions  Continue on Spiriva and Striverdi daily , rinse after use .  Continue on Oxygen 2l/m  Continue on CPAP At bedtime  With oxygen .  Wear each night for at least 6 hr each night .  Follow up with Cardiology as planned .  Remain off Eliquis for now , discuss with  Cardiology when to restart.  Follow up in 4-6 weeks with Dr. Vassie Loll or Lummie Montijo NP with chest xray .  Please contact office for sooner follow up if symptoms do not improve or worsen or seek emergency care       Chronic systolic CHF (congestive heart failure) (HCC) Appears to be compensated and euvolemic on exam with no evidence of volume overload continue on current regimen and follow-up with cardiology as planned  PAF (paroxysmal atrial fibrillation) (HCC) Continue follow-up with cardiology as planned in 2 weeks.   On exam appears to be in regular rhythm. Patient will discussed Eliquis restart at follow-up with cardiology  Chronic respiratory failure with hypoxia (HCC) Compensated on present regimen with no increased oxygen demands.  Continue on 2 L of oxygen  OSA on CPAP Continue on nocturnal CPAP with oxygen  Acute ITP (HCC) Platelet levels are improving on steroids.  Patient is to taper steroids as directed by hematology and follow-up as planned with hematology.  Appreciate their assistance  Lung nodules Appear to be stable on most recent CT scan.  Will decide on next scan on return visit after chest x-ray     Rubye Oaks, NP 02/16/2020

## 2020-02-16 NOTE — Assessment & Plan Note (Signed)
Community-acquired pneumonia with associated hemoptysis.  Patient has known nodularity that we have been following serially on CT scan that did appear to be stable however now he has superimposed consolidation most consistent with an acute infection.  Patient is clinically better after antibiotics.  We will recheck chest x-ray in 4 to 6 weeks.  At that time will consider when next serial CT chest needs to be set up.  Plan  Patient Instructions  Continue on Spiriva and Striverdi daily , rinse after use .  Continue on Oxygen 2l/m  Continue on CPAP At bedtime  With oxygen .  Wear each night for at least 6 hr each night .  Follow up with Cardiology as planned .  Remain off Eliquis for now , discuss with Cardiology when to restart.  Follow up in 4-6 weeks with Dr. Vassie Loll or Voncille Simm NP with chest xray .  Please contact office for sooner follow up if symptoms do not improve or worsen or seek emergency care

## 2020-02-16 NOTE — Assessment & Plan Note (Signed)
Recent episodes of hemoptysis in the setting of acute relapsed acute ITP, anticoagulation, probable pneumonia, COPD. Patient is significantly improved over the last week.  Platelet levels have responded well to prednisone.  He is up to 98,000. Clinically pneumonia seems to be improving after antibiotics.  Hemoptysis has resolved with no recurrence over the last 48 hours.  Patient has a follow-up appointment with cardiology in less than 2 weeks.  For now will hold on restarting Eliquis.  Could consider restarting when seen by cardiology if they approved and or lower dose as patient is higher risk with ITP, age with balance issues.    Plan  Patient Instructions  Continue on Spiriva and Striverdi daily , rinse after use .  Continue on Oxygen 2l/m  Continue on CPAP At bedtime  With oxygen .  Wear each night for at least 6 hr each night .  Follow up with Cardiology as planned .  Remain off Eliquis for now , discuss with Cardiology when to restart.  Follow up in 4-6 weeks with Dr. Vassie Loll or Jashan Cotten NP with chest xray .  Please contact office for sooner follow up if symptoms do not improve or worsen or seek emergency care

## 2020-02-16 NOTE — Assessment & Plan Note (Signed)
Appear to be stable on most recent CT scan.  Will decide on next scan on return visit after chest x-ray

## 2020-02-17 ENCOUNTER — Telehealth: Payer: Self-pay | Admitting: Hematology and Oncology

## 2020-02-17 NOTE — Telephone Encounter (Signed)
Scheduled per 7/21 los. Called and left a msg, mailing appt letter and calendar printout   

## 2020-02-19 DIAGNOSIS — J449 Chronic obstructive pulmonary disease, unspecified: Secondary | ICD-10-CM | POA: Diagnosis not present

## 2020-02-19 DIAGNOSIS — J8410 Pulmonary fibrosis, unspecified: Secondary | ICD-10-CM | POA: Diagnosis not present

## 2020-02-19 DIAGNOSIS — E860 Dehydration: Secondary | ICD-10-CM | POA: Diagnosis not present

## 2020-02-19 DIAGNOSIS — G4733 Obstructive sleep apnea (adult) (pediatric): Secondary | ICD-10-CM | POA: Diagnosis not present

## 2020-02-20 ENCOUNTER — Inpatient Hospital Stay: Payer: Medicare PPO | Admitting: Hematology and Oncology

## 2020-02-20 ENCOUNTER — Inpatient Hospital Stay: Payer: Medicare PPO

## 2020-02-20 NOTE — Progress Notes (Signed)
HPI: FU CAD.Cardiac cath 2/18 showed 30 LM, 50 LAD, aneurysmal RCA with 80 mid to distal; had DES to RCA at that time.Carotid dopplers 2/19 showed no significant stenosis. Admitted with sepsis 2/19 and also with atrial fibrillation, converted spontaneously to sinus; TSH low and synthroid adjusted. Placed on amiodarone.Amiodarone discontinued at previous office visit. Echocardiogram August 2020 showed normal LV function. 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 Patient did see pulmonary for lung nodules and follow-up chest CT recommended 3 months,course of antibiotics prescribed. Had pacemaker placed December 2024 history of syncope and pauses noted on monitor.  Chest CT July 2021 showed 4 cm thoracic aortic aneurysm of the mid transverse aorta.  Patient with recent diagnosis of ITP and also had hemoptysis.  Apixaban on hold.  Since last seen,  he has baseline dyspnea and is on home oxygen related to his emphysema.  No chest pain, palpitations or syncope.  No pedal edema.  Current Outpatient Medications  Medication Sig Dispense Refill  . albuterol (PROVENTIL) (2.5 MG/3ML) 0.083% nebulizer solution 1 vial in nebulizer every 6 hours as needed Dx 496 120 mL 6  . ALPRAZolam (XANAX) 0.5 MG tablet Take 0.5 mg by mouth at bedtime.     [name] atorvastatin (LIPITOR) 40 MG tablet Take 1 tablet (40 mg total) by mouth daily. 90 tablet 3  . Carboxymethylcellul-Glycerin (LUBRICATING EYE DROPS OP) Place 1 drop into both eyes 4 (four) times daily as needed (dry eyes).     . Cholecalciferol (VITAMIN D3) 2000 units capsule Take 2,000 Units by mouth daily.     . furosemide (LASIX) 40 MG tablet Take 1 tablet (40 mg total) by mouth daily. 90 tablet 3  . insulin glargine (LANTUS) 100 UNIT/ML injection Inject 60 Units into the skin at  bedtime.     [name] levothyroxine (SYNTHROID) 125 MCG tablet Take 125 mcg by mouth daily before breakfast.     . losartan (COZAAR) 50 MG tablet Take 1 tablet (50 mg total) by mouth daily. 90 tablet 3  . metFORMIN (GLUCOPHAGE) 500 MG tablet Take 500 mg by mouth 2 (two) times daily with a meal.    . Multiple Vitamins-Minerals (MEGA MULTIVITAMIN FOR MEN PO) Take 1 tablet by mouth daily.     . Olodaterol HCl (STRIVERDI RESPIMAT) 2.5 MCG/ACT AERS Inhale 1 puff into the lungs daily.    . OXYGEN Inhale 2 L into the lungs continuous.    . predniSONE (DELTASONE) 10 MG tablet Take 3 tablets (30 mg total) by mouth daily with breakfast. 50 tablet 0  . Semaglutide (OZEMPIC) 1 MG/DOSE SOPN Inject 1 mg into the skin every Friday.     . tamsulosin (FLOMAX) 0.4 MG CAPS capsule Take 1 capsule (0.4 mg total) by mouth at bedtime. 30 capsule 0  . tiotropium (SPIRIVA HANDIHALER) 18 MCG inhalation capsule Place 1 capsule (18 mcg total) into inhaler and inhale daily. 30 capsule 4  . apixaban (ELIQUIS) 5 MG TABS tablet Take 1 tablet (5 mg total) by mouth 2 (two) times daily. (Patient not taking: Reported on 02/16/2020) 180 tablet 2   Current Facility-Administered Medications  Medication Dose Route Frequency Provider Last Rate Last Admin  . methylPREDNISolone acetate (DEPO-MEDROL) injection 40 mg  40 mg Other Once Tyrell Antonio, MD         Past Medical History:  Diagnosis  Date  . 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 Anxiety   . Aortic aneurysm (HCC) 09/02/2017  . BENIGN PROSTATIC HYPERTROPHY 11/23/2009  . Cardiomyopathy (HCC) 08/28/2016  . Chronic systolic CHF (congestive heart failure) (HCC) 09/02/2017  . COPD (chronic obstructive pulmonary disease) (HCC)   . CORONARY ARTERY DISEASE 11/23/2009  . 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 DEGENERATIVE JOINT DISEASE 11/23/2009  . DEPRESSION 11/23/2009  . FATIGUE 11/23/2009  . GAIT DISTURBANCE 12/10/2009  . HEMOPTYSIS UNSPECIFIED 05/07/2010  . High cholesterol   .  HYPERTENSION 07/30/2009  . HYPOTHYROIDISM 07/30/2009  . Ischemic cardiomyopathy 09/02/2017  . 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 On home oxygen therapy    "2-3L; 24/7" (09/10/2016)  . OSA on CPAP   . PTSD (post-traumatic stress disorder) 03/10/2012  . 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 RA (rheumatoid arthritis) (HCC) 06/11/2011   "qwhere" (09/10/2016)  . RESPIRATORY FAILURE, CHRONIC 07/31/2009  . Scleritis of both eyes 03/17/2014  . Thrombocytopenia (HCC)   . 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 Type II diabetes mellitus (HCC)     Past Surgical History:  Procedure Laterality Date  . 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 ABDOMINAL EXPLORATION SURGERY  02/2004   w/LOA/notes 12/10/2010; small bowel obstruction repair with adhesiolysis   . BACK SURGERY    . CARDIAC CATHETERIZATION     2 heart caths in the past.  One in 2000s showed one ulcerated plaque  Rx medically; Second at [facility] [name] 09/05/2016  . CATARACT EXTRACTION W/ INTRAOCULAR LENS  IMPLANT, BILATERAL Bilateral 2000s  . COLECTOMY     hx of remote ileum resection due to bleeding  . COLONOSCOPY WITH PROPOFOL N/A 01/22/2018   Procedure: COLONOSCOPY WITH PROPOFOL;  Surgeon: Iva Boop, MD;  Location: [facility] ENDOSCOPY;  Service: Endoscopy;  Laterality: N/A;  . CORONARY ANGIOPLASTY WITH STENT PLACEMENT  09/10/2016  . CORONARY STENT INTERVENTION N/A 09/10/2016   Procedure: Coronary Stent Intervention;  Surgeon: Kathleene Hazel, MD;  Location: [facility] INVASIVE CV LAB;  Service: Cardiovascular;  Laterality: N/A;  Distal RCA 4.0x16 Synergy  . ESOPHAGOGASTRODUODENOSCOPY (EGD) WITH PROPOFOL N/A 01/22/2018   Procedure: ESOPHAGOGASTRODUODENOSCOPY (EGD) WITH PROPOFOL;  Surgeon: Iva Boop, MD;  Location: [facility] ENDOS[facility];  Service: Endoscopy;  Laterality: N/A;  . FEMORAL EMBOLOECTOMY Left 07/2000   with left leg ischemia; Dr. Hart Rochester, vascular  . GANGLION CYST EXCISION Right    "wrist"; Dr. Teressa Senter  . HOT HEMOSTASIS N/A 01/22/2018    Procedure: HOT HEMOSTASIS (ARGON PLASMA COAGULATION/BICAP);  Surgeon: Iva Boop, MD;  Location: [facility];  Service: Endoscopy;  Laterality: N/A;  . LOOP RECORDER INSERTION N/A 04/25/2019   Procedure: LOOP RECORDER INSERTION;  Surgeon: Duke Salvia, MD;  Location: [facility] INVASIVE CV LAB;  Service: Cardiovascular;  Laterality: N/A;  . LOOP RECORDER REMOVAL N/A 07/01/2019   Procedure: LOOP RECORDER REMOVAL;  Surgeon: Duke Salvia, MD;  Location: [facility] INVASIVE CV LAB;  Service: Cardiovascular;  Laterality: N/A;  . LUMBAR LAMINECTOMY  1972   Dr. Fannie Knee  . PACEMAKER IMPLANT N/A 07/01/2019   Procedure: PACEMAKER IMPLANT;  Surgeon: Duke Salvia, MD;  Location: [facility] INVASIVE CV LAB;  Service: Cardiovascular;  Laterality: N/A;  . RIGHT/LEFT HEART CATH AND CORONARY ANGIOGRAPHY N/A 09/10/2016   Procedure: Right/Left Heart Cath and Coronary Angiography;  Surgeon: Kathleene Hazel, MD;  Location: [facility] INVASIVE CV LAB;  Service: Cardiovascular;  Laterality: N/A;  . TONSILLECTOMY      Social History   Socioeconomic  History  . Marital status: Widowed    Spouse name: Not on file  . Number of children: Not on file  . Years of education: Not on file  . Highest education level: Not on file  Occupational History  . Occupation: disabled Cytogeneticist, Ex Neurosurgeon: RETIRED  Tobacco Use  . Smoking status: Former Smoker    Packs/day: 2.50    Years: 40.00    Pack years: 100.00    Types: Cigarettes, Pipe, Cigars    Quit date: 07/28/1998    Years since quitting: 21.6  . Smokeless tobacco: Never Used  Vaping Use  . Vaping Use: Never used  Substance and Sexual Activity  . Alcohol use: No    Alcohol/week: 0.0 standard drinks  . Drug use: No  . Sexual activity: Not Currently  Other Topics Concern  . Not on file  Social History Narrative   Lives in the home with his wife   Sees [place] every 6 month for meds.   Denies asbestos exposure   Social Determinants of Corporate investment banker  Strain:   . Difficulty of Paying Living Expenses:   Food Insecurity:   . Worried About Programme researcher, broadcasting/film/video in the Last Year:   . Barista in the Last Year:   Transportation Needs:   . Freight forwarder (Medical):   [name] Lack of Transportation (Non-Medical):   Physical Activity:   . Days of Exercise per Week:   . Minutes of Exercise per Session:   Stress:   . Feeling of Stress :   Social Connections:   . Frequency of Communication with Friends and Family:   . Frequency of Social Gatherings with Friends and Family:   . Attends Religious Services:   . Active Member of Clubs or Organizations:   . Attends Banker Meetings:   [name] Marital Status:   Intimate Partner Violence:   . Fear of Current or Ex-Partner:   . Emotionally Abused:   [name] Physically Abused:   . Sexually Abused:     Family History  Problem Relation Age of Onset  . Other Mother        gun shot    ROS: no fevers or chills, productive cough, hemoptysis, dysphasia, odynophagia, melena, hematochezia, dysuria, hematuria, rash, seizure activity, orthopnea, PND, pedal edema, claudication. Remaining systems are negative.  Physical Exam: Well-developed well-nourished in no acute distress.  Skin is warm and dry.  HEENT is normal.  Neck is supple.  Chest with diminished breath sounds throughout Cardiovascular exam is regular rate and rhythm.  Abdominal exam nontender or distended. No masses palpated. Extremities show trace edema. neuro grossly intact  ECG-sinus rhythm with PACs, left bundle branch block.  First-degree AV block.  Personally reviewed  A/P  1 coronary artery disease-patient denies chest pain.  Plan to continue statin.    2 prior pacemaker implant-follow-up electrophysiology.  3 history of paroxysmal atrial fibrillation-patient remains in sinus rhythm.  His apixaban is on hold as he was recently diagnosed with ITP and also had hemoptysis.  We will continue to hold for now.  I will see  him back in 3 months and likely resume at that point if his platelet count continues to improve.  4 hypertension-blood pressure borderline.  However he also has some occasional orthostatic symptoms.  We will continue present medications and follow.  5 hyperlipidemia-continue statin.  Check lipids and liver.  6 history of cardiomyopathy-LV function has improved on  most recent echocardiogram.  7 thoracic/abdominal/iliac aneurysms-we will arrange follow-up CTA to reassess.  8 chronic combined systolic/diastolic congestive heart failure-he is euvolemic on exam today.  Continue Lasix as needed.  9 history of pulmonary nodules and COPD-followed by pulmonary.  10 recent acute ITP-Per hematology.  Olga Millers, MD

## 2020-02-21 ENCOUNTER — Encounter: Payer: Self-pay | Admitting: *Deleted

## 2020-02-22 DIAGNOSIS — R609 Edema, unspecified: Secondary | ICD-10-CM | POA: Diagnosis not present

## 2020-02-23 LAB — BASIC METABOLIC PANEL
BUN/Creatinine Ratio: 21 (ref 10–24)
BUN: 28 mg/dL — ABNORMAL HIGH (ref 8–27)
CO2: 25 mmol/L (ref 20–29)
Calcium: 9.5 mg/dL (ref 8.6–10.2)
Chloride: 97 mmol/L (ref 96–106)
Creatinine, Ser: 1.35 mg/dL — ABNORMAL HIGH (ref 0.76–1.27)
GFR calc Af Amer: 58 mL/min/{1.73_m2} — ABNORMAL LOW (ref >59)
GFR calc non Af Amer: 50 mL/min/{1.73_m2} — ABNORMAL LOW (ref >59)
Glucose: 359 mg/dL — ABNORMAL HIGH (ref 65–99)
Potassium: 5.2 mmol/L (ref 3.5–5.2)
Sodium: 136 mmol/L (ref 134–144)

## 2020-02-24 DIAGNOSIS — G4733 Obstructive sleep apnea (adult) (pediatric): Secondary | ICD-10-CM | POA: Diagnosis not present

## 2020-02-24 DIAGNOSIS — E860 Dehydration: Secondary | ICD-10-CM | POA: Diagnosis not present

## 2020-02-24 DIAGNOSIS — J8410 Pulmonary fibrosis, unspecified: Secondary | ICD-10-CM | POA: Diagnosis not present

## 2020-02-24 DIAGNOSIS — J449 Chronic obstructive pulmonary disease, unspecified: Secondary | ICD-10-CM | POA: Diagnosis not present

## 2020-02-27 ENCOUNTER — Encounter: Payer: Self-pay | Admitting: Cardiology

## 2020-02-27 ENCOUNTER — Telehealth: Payer: Self-pay | Admitting: Cardiology

## 2020-02-27 ENCOUNTER — Other Ambulatory Visit: Payer: Self-pay

## 2020-02-27 ENCOUNTER — Ambulatory Visit: Payer: Medicare PPO | Admitting: Cardiology

## 2020-02-27 VITALS — BP 152/82 | HR 82 | Ht 75.0 in | Wt 215.4 lb

## 2020-02-27 DIAGNOSIS — I712 Thoracic aortic aneurysm, without rupture, unspecified: Secondary | ICD-10-CM

## 2020-02-27 DIAGNOSIS — I723 Aneurysm of iliac artery: Secondary | ICD-10-CM | POA: Diagnosis not present

## 2020-02-27 DIAGNOSIS — I480 Paroxysmal atrial fibrillation: Secondary | ICD-10-CM | POA: Diagnosis not present

## 2020-02-27 DIAGNOSIS — I2510 Atherosclerotic heart disease of native coronary artery without angina pectoris: Secondary | ICD-10-CM | POA: Diagnosis not present

## 2020-02-27 NOTE — Patient Instructions (Signed)
Medication Instructions:  No Changes *If you need a refill on your cardiac medications before your next appointment, please call your pharmacy*   Lab Work: Lipids and Liver on Wednesday with other labs If you have labs (blood work) drawn today and your tests are completely normal, you will receive your results only by: [name] MyChart Message (if you have MyChart) OR . A paper copy in the mail If you have any lab test that is abnormal or we need to change your treatment, we will call you to review the results.   Testing/Procedures: CTA- of chest, abdomen and pelvis at [facility].    Follow-Up: At [facility], you and your health needs are our priority.  As part of our continuing mission to provide you with exceptional heart care, we have created designated Provider Care Teams.  These Care Teams include your primary Cardiologist (physician) and Advanced Practice Providers (APPs -  Physician Assistants and Nurse Practitioners) who all work together to provide you with the care you need, when you need it.  We recommend signing up for the patient portal called "MyChart".  Sign up information is provided on this After Visit Summary.  MyChart is used to connect with patients for Virtual Visits (Telemedicine).  Patients are able to view lab/test results, encounter notes, upcoming appointments, etc.  Non-urgent messages can be sent to your provider as well.   To learn more about what you can do with MyChart, go to [email or web address].    Your next appointment:   3 month(s)  The format for your next appointment:   In Person  Provider:   Olga Millers, MD   Other Instructions

## 2020-02-27 NOTE — Telephone Encounter (Signed)
Left message for patient to call and discuss scheduling CTA chest aorta and CTA abdomen and pelvis ordered by Dr. Jens Som

## 2020-02-28 NOTE — Telephone Encounter (Signed)
Spoke with patient regarding appointment for CTA chest/aorta and CTA abdomen/pelvis scheduled Friday 03/02/20 at 11:00 am [facility]---arrival time is 10:45 am ---Liquids only 4 hours prior to study.  Patient voiced his understanding.

## 2020-02-29 DIAGNOSIS — R911 Solitary pulmonary nodule: Secondary | ICD-10-CM | POA: Diagnosis not present

## 2020-02-29 DIAGNOSIS — Z79899 Other long term (current) drug therapy: Secondary | ICD-10-CM | POA: Diagnosis not present

## 2020-02-29 DIAGNOSIS — M1990 Unspecified osteoarthritis, unspecified site: Secondary | ICD-10-CM | POA: Diagnosis not present

## 2020-02-29 DIAGNOSIS — D693 Immune thrombocytopenic purpura: Secondary | ICD-10-CM | POA: Diagnosis not present

## 2020-02-29 DIAGNOSIS — J449 Chronic obstructive pulmonary disease, unspecified: Secondary | ICD-10-CM | POA: Diagnosis not present

## 2020-02-29 DIAGNOSIS — M0579 Rheumatoid arthritis with rheumatoid factor of multiple sites without organ or systems involvement: Secondary | ICD-10-CM | POA: Diagnosis not present

## 2020-02-29 DIAGNOSIS — H15009 Unspecified scleritis, unspecified eye: Secondary | ICD-10-CM | POA: Diagnosis not present

## 2020-02-29 DIAGNOSIS — M79643 Pain in unspecified hand: Secondary | ICD-10-CM | POA: Diagnosis not present

## 2020-02-29 DIAGNOSIS — D649 Anemia, unspecified: Secondary | ICD-10-CM | POA: Diagnosis not present

## 2020-03-02 ENCOUNTER — Other Ambulatory Visit: Payer: Self-pay

## 2020-03-02 ENCOUNTER — Ambulatory Visit (HOSPITAL_BASED_OUTPATIENT_CLINIC_OR_DEPARTMENT_OTHER)
Admission: RE | Admit: 2020-03-02 | Discharge: 2020-03-02 | Disposition: A | Discharge status: Home / Self Care | Payer: Medicare PPO | Source: Ambulatory Visit | Attending: Cardiology | Admitting: Cardiology

## 2020-03-02 DIAGNOSIS — I712 Thoracic aortic aneurysm, without rupture, unspecified: Secondary | ICD-10-CM

## 2020-03-02 DIAGNOSIS — I714 Abdominal aortic aneurysm, without rupture: Secondary | ICD-10-CM | POA: Diagnosis not present

## 2020-03-02 DIAGNOSIS — I723 Aneurysm of iliac artery: Secondary | ICD-10-CM | POA: Insufficient documentation

## 2020-03-02 MED ORDER — IOHEXOL 350 MG/ML SOLN
100.0000 mL | Freq: Once | INTRAVENOUS | Status: AC | PRN
  Administered 2020-03-02: 100 mL via INTRAVENOUS

## 2020-03-08 DIAGNOSIS — J449 Chronic obstructive pulmonary disease, unspecified: Secondary | ICD-10-CM | POA: Diagnosis not present

## 2020-03-08 DIAGNOSIS — G4733 Obstructive sleep apnea (adult) (pediatric): Secondary | ICD-10-CM | POA: Diagnosis not present

## 2020-03-12 ENCOUNTER — Encounter: Payer: Self-pay | Admitting: Internal Medicine

## 2020-03-15 ENCOUNTER — Encounter: Payer: Self-pay | Admitting: Adult Health

## 2020-03-15 ENCOUNTER — Ambulatory Visit: Payer: Medicare PPO | Admitting: Adult Health

## 2020-03-15 ENCOUNTER — Other Ambulatory Visit: Payer: Self-pay

## 2020-03-15 ENCOUNTER — Ambulatory Visit (INDEPENDENT_AMBULATORY_CARE_PROVIDER_SITE_OTHER): Payer: Medicare PPO

## 2020-03-15 VITALS — BP 120/50 | HR 77 | Temp 97.6°F | Ht 74.0 in | Wt 213.0 lb

## 2020-03-15 DIAGNOSIS — D693 Immune thrombocytopenic purpura: Secondary | ICD-10-CM | POA: Diagnosis not present

## 2020-03-15 DIAGNOSIS — J449 Chronic obstructive pulmonary disease, unspecified: Secondary | ICD-10-CM

## 2020-03-15 DIAGNOSIS — G4733 Obstructive sleep apnea (adult) (pediatric): Secondary | ICD-10-CM | POA: Diagnosis not present

## 2020-03-15 DIAGNOSIS — J189 Pneumonia, unspecified organism: Secondary | ICD-10-CM

## 2020-03-15 DIAGNOSIS — J9611 Chronic respiratory failure with hypoxia: Secondary | ICD-10-CM

## 2020-03-15 DIAGNOSIS — Z9989 Dependence on other enabling machines and devices: Secondary | ICD-10-CM

## 2020-03-15 NOTE — Assessment & Plan Note (Signed)
Continue on oxygen 2 L 

## 2020-03-15 NOTE — Assessment & Plan Note (Signed)
Excellent control and compliance on nocturnal CPAP.  Will adjust CPAP pressure for comfort.  Change CPAP pressure to 20 cm H2O. CPAP download in 1 month.

## 2020-03-15 NOTE — Patient Instructions (Addendum)
Continue on Spiriva and Striverdi daily , rinse after use .  Continue on Oxygen 2l/m  Continue on CPAP At bedtime  With oxygen .  Wear each night for at least 6 hr each night .  Adjust CPAP 10 to 20cm H2O.  CPAP download in 1 month.  Chest xray today .  Follow up in 3 months with Dr. Vassie Loll or Kaidin Boehle NP .  Please contact office for sooner follow up if symptoms do not improve or worsen or seek emergency care

## 2020-03-15 NOTE — Progress Notes (Signed)
  ID: [name], male    DOB: May 31, 1941, 79 y.o.   MRN: [id number]  Chief Complaint  Patient presents with  . Follow-up    COPD     Referring provider: Myrlene Broker, *  HPI: 79 year old male former smoker followed for severe COPD, chronic respiratory failure on oxygen at 2 L.  Patient has obstructive sleep apnea on nocturnal CPAP.  Known lung nodules that are followed serially on CT chest Medical history significant for rheumatoid arthritis (no maintenance medications), diabetes, congestive heart failure (diagnosed 2018 with echo showing EF at 35 to 40%, coronary disease status post RCA stent in 2018, chronic kidney disease, A. fib previously on amiodarone stopped in August 2019.  Previously on Eliquis recently stopped July 2021. Status post pacemaker after syncopal episodes in fall 2020.  Pacemaker placed in December 2020. Gets medications through the [place] system previously was in the [employer].  TEST/EVENTS :  CT was done on September 13, 2019 showed a slightly decreased cavitary left apical nodule measuring 1.0 cm. Irregular subsolid left lower lobe pulmonary nodules are stable largest at 1.8 cm. There were no new or enlarging pulmonary nodules. We discussed a follow-up CT chest in 1 year.  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  03/15/2020 Follow up : COPD , O2 RF , OSA , Hemoptysis , ITP  Patient returns for 1 month follow-up.  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  Patient's Eliquis was held.  And it was restarted on prednisone by hematology.  CT chest showed a new airspace consolidation and groundglass opacities in the right upper lobe, right lower lobe and lingula indicative of a possible pneumonia.  Along with an infected bullous lesion in  the right lower lobe.  Patient was treated with a Z-Pak.  Since last visit patient says he has been slowly improving.  Hemoptysis resolved with no return.  Cough and congestion also decreased. Patient was seen by cardiology since last visit.  Eliquis remains on hold with no plans on restart at this time. Patient has follow-up with hematology next week.  Is currently on prednisone 5 mg daily.  Last labs showed improvement in platelets. Patient remains on Spiriva and striverdi inhalers.  Continues on oxygen 2 L.  Patient recently new CPAP machine.  Says he is doing very well on his CPAP.  Says that he wears his CPAP every single night.  Does feel that the pressure is not quite as strong.  He remains on CPAP with oxygen at bedtime.  CPAP download shows excellent compliance with daily average usage at 7 hours.  Patient is on CPAP 14 cm H2O.  AHI 4.6.  Minimal leaks.  No Known Allergies  Immunization History  Administered Date(s) Administered  . Fluad Quad(high Dose 65+) 04/26/2019  . Influenza Split 04/14/2011, 04/27/2013  . Influenza Whole 05/28/2009, 04/27/2010, 03/31/2012  . Influenza, High Dose Seasonal PF 03/21/2015, 05/16/2017, 04/08/2018  . Influenza,inj,Quad PF,6+ Mos 03/17/2014, 04/04/2016  . Influenza-Unspecified 05/26/2011, 05/07/2012  . PFIZER SARS-COV-2 Vaccination 08/19/2019, 09/09/2019  . Pneumococcal Conjugate-13 09/20/2013  . Pneumococcal Polysaccharide-23 05/28/2008, 04/25/2010  . Pneumococcal-Unspecified 04/27/2013  . Td 11/23/2009  . Zoster 04/28/2009    Past Medical History:  Diagnosis Date  . 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 Anxiety   . Aortic  aneurysm (HCC) 09/02/2017  . BENIGN PROSTATIC HYPERTROPHY 11/23/2009  . Cardiomyopathy (HCC) 08/28/2016  . Chronic systolic CHF (congestive heart failure) (HCC) 09/02/2017  . COPD (chronic obstructive pulmonary disease) (HCC)   . CORONARY ARTERY DISEASE 11/23/2009  . 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 DEGENERATIVE  JOINT DISEASE 11/23/2009  . DEPRESSION 11/23/2009  . FATIGUE 11/23/2009  . GAIT DISTURBANCE 12/10/2009  . HEMOPTYSIS UNSPECIFIED 05/07/2010  . High cholesterol   . HYPERTENSION 07/30/2009  . HYPOTHYROIDISM 07/30/2009  . Ischemic cardiomyopathy 09/02/2017  . 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 On home oxygen therapy    "2-3L; 24/7" (09/10/2016)  . OSA on CPAP   . PTSD (post-traumatic stress disorder) 03/10/2012  . 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 RA (rheumatoid arthritis) (HCC) 06/11/2011   "qwhere" (09/10/2016)  . RESPIRATORY FAILURE, CHRONIC 07/31/2009  . Scleritis of both eyes 03/17/2014  . Thrombocytopenia (HCC)   . 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 Type II diabetes mellitus (HCC)     Tobacco History: Social History   Tobacco Use  Smoking Status Former Smoker  . Packs/day: 2.50  . Years: 40.00  . Pack years: 100.00  . Types: Cigarettes, Pipe, Cigars  . Quit date: 07/28/1998  . Years since quitting: 21.6  Smokeless Tobacco Never Used   Counseling given: Not Answered   Outpatient Medications Prior to Visit  Medication Sig Dispense Refill  . albuterol (PROVENTIL) (2.5 MG/3ML) 0.083% nebulizer solution 1 vial in nebulizer every 6 hours as needed Dx 496 120 mL 6  . ALPRAZolam (XANAX) 0.5 MG tablet Take 0.5 mg by mouth at bedtime.     [name] atorvastatin (LIPITOR) 40 MG tablet Take 1 tablet (40 mg total) by mouth daily. 90 tablet 3  . Carboxymethylcellul-Glycerin (LUBRICATING EYE DROPS OP) Place 1 drop into both eyes 4 (four) times daily as needed (dry eyes).     . Cholecalciferol (VITAMIN D3) 2000 units capsule Take 2,000 Units by mouth daily.     . furosemide (LASIX) 40 MG tablet Take 1 tablet (40 mg total) by mouth daily. 90 tablet 3  . insulin glargine (LANTUS) 100 UNIT/ML injection Inject 60 Units into the skin at bedtime.     [name] levothyroxine (SYNTHROID) 125 MCG tablet Take 125 mcg by mouth daily before breakfast.     . losartan (COZAAR) 50 MG tablet Take 1 tablet (50 mg total) by mouth daily.  90 tablet 3  . metFORMIN (GLUCOPHAGE) 500 MG tablet Take 500 mg by mouth 2 (two) times daily with a meal.    . Multiple Vitamins-Minerals (MEGA MULTIVITAMIN FOR MEN PO) Take 1 tablet by mouth daily.     . Olodaterol HCl (STRIVERDI RESPIMAT) 2.5 MCG/ACT AERS Inhale 1 puff into the lungs daily.    . OXYGEN Inhale 2 L into the lungs continuous.    . predniSONE (DELTASONE) 10 MG tablet Take 3 tablets (30 mg total) by mouth daily with breakfast. 50 tablet 0  . Semaglutide (OZEMPIC) 1 MG/DOSE SOPN Inject 1 mg into the skin every Friday.     . tamsulosin (FLOMAX) 0.4 MG CAPS capsule Take 1 capsule (0.4 mg total) by mouth at bedtime. 30 capsule 0  . tiotropium (SPIRIVA HANDIHALER) 18 MCG inhalation capsule Place 1 capsule (18 mcg total) into inhaler and inhale daily. 30 capsule 4  . apixaban (ELIQUIS) 5 MG TABS tablet Take 1 tablet (5 mg total) by mouth 2 (two) times daily. (Patient not taking: Reported on 02/16/2020) 180 tablet 2   Facility-Administered Medications Prior to Visit  Medication Dose Route Frequency Provider Last Rate Last Admin  . methylPREDNISolone acetate (DEPO-MEDROL) injection 40 mg  40 mg Other Once Tyrell Antonio, MD         Review of Systems:   Constitutional:   No  weight loss, night sweats,  Fevers, chills,  +fatigue, or  lassitude.  HEENT:   No headaches,  Difficulty swallowing,  Tooth/dental problems, or  Sore throat,                No sneezing, itching, ear ache, nasal congestion, post nasal drip,   CV:  No chest pain,  Orthopnea, PND,   anasarca, dizziness, palpitations, syncope.   GI  No heartburn, indigestion, abdominal pain, nausea, vomiting, diarrhea, change in bowel habits, loss of appetite, bloody stools.   Resp:    No chest wall deformity  Skin: no rash or lesions.  GU: no dysuria, change in color of urine, no urgency or frequency.  No flank pain, no hematuria   MS:  No joint pain or swelling.  No decreased range of motion.  No back pain.    Physical  Exam  BP (!) 120/50 (BP Location: Left Arm, Cuff Size: Normal)   Pulse 77   Temp 97.6 F (36.4 C) (Temporal)   Ht 6\' 2"  (1.88 m)   Wt 213 lb (96.6 kg)   SpO2 99%   BMI 27.35 kg/m   GEN: A/Ox3; pleasant , NAD, on oxygen   HEENT:  [place]/AT,   NOSE-clear, THROAT-clear, no lesions, no postnasal drip or exudate noted.   NECK:  Supple w/ fair ROM; no JVD; normal carotid impulses w/o bruits; no thyromegaly or nodules palpated; no lymphadenopathy.    RESP  Clear  P & A; w/o, wheezes/ rales/ or rhonchi. no accessory muscle use, no dullness to percussion  CARD:  RRR, no m/r/g,tr  peripheral edema, pulses intact, no cyanosis or clubbing.  GI:   Soft & nt; nml bowel sounds; no organomegaly or masses detected.   Musco: Warm bil, no deformities or joint swelling noted.   Neuro: alert, no focal deficits noted.    Skin: Warm, no lesions or rashes    Lab Results:  CBC    Component Value Date/Time   WBC 9.9 02/15/2020 1110   WBC 7.8 02/10/2020 0851   RBC 4.18 (L) 02/15/2020 1110   HGB 11.1 (L) 02/15/2020 1110   HGB 12.0 (L) 06/28/2019 1453   HCT 35.2 (L) 02/15/2020 1110   HCT 38.4 06/28/2019 1453   PLT 98 (L) 02/15/2020 1110   PLT 44 (LL) 06/28/2019 1453   MCV 84.2 02/15/2020 1110   MCV 89 06/28/2019 1453   MCH 26.6 02/15/2020 1110   MCHC 31.5 02/15/2020 1110   RDW 14.3 02/15/2020 1110   RDW 13.8 06/28/2019 1453   LYMPHSABS 2.3 02/15/2020 1110   LYMPHSABS 1.2 12/23/2016 1053   MONOABS 0.9 02/15/2020 1110   EOSABS 0.1 02/15/2020 1110   EOSABS 0.3 12/23/2016 1053   BASOSABS 0.0 02/15/2020 1110   BASOSABS 0.0 12/23/2016 1053    BMET    Component Value Date/Time   NA 136 02/22/2020 1029   K 5.2 02/22/2020 1029   CL 97 02/22/2020 1029   CO2 25 02/22/2020 1029   GLUCOSE 359 (H) 02/22/2020 1029   GLUCOSE 111 (H) 02/08/2020 1031   BUN 28 (H) 02/22/2020 1029   CREATININE 1.35 (H) 02/22/2020 1029   CREATININE 1.12 07/15/2018 1059   CALCIUM 9.5 02/22/2020 1029   GFRNONAA 50  (L) 02/22/2020  1029   GFRNONAA >60 07/15/2018 1059   GFRAA 58 (L) 02/22/2020 1029   GFRAA >60 07/15/2018 1059    BNP No results found for: BNP  ProBNP    Component Value Date/Time   PROBNP 188 10/20/2016 1038   PROBNP 59.9 05/04/2011 0517    Imaging: CT ANGIO CHEST AORTA W/CM & OR WO/CM  Result Date: 03/02/2020 CLINICAL DATA:  79 year old male with a history of thoracic aortic aneurysm. Follow-up evaluation. EXAM: CT ANGIOGRAPHY CHEST, ABDOMEN AND PELVIS TECHNIQUE: Non-contrast CT of the chest was initially obtained. Multidetector CT imaging through the chest, abdomen and pelvis was performed using the standard protocol during bolus administration of intravenous contrast. Multiplanar reconstructed images and MIPs were obtained and reviewed to evaluate the vascular anatomy. CONTRAST:  OMNIPAQUE IOHEXOL 350 MG/ML SOLN COMPARISON:  Prior CT scan of the chest 02/08/2020; prior CT scan of the chest abdomen and pelvis 03/23/2019 FINDINGS: CTA CHEST FINDINGS Cardiovascular: 3 vessel aortic arch. The aortic root and ascending thoracic aorta are normal. There is focal fusiform aneurysmal dilation at the aortic isthmus with a maximal diameter of 4 cm (unchanged compared to 03/23/2019). This is unchanged compared to prior imaging. Mild atherosclerotic plaque is present. No evidence of dissection. Unremarkable main pulmonary artery. The heart is normal in size. No pericardial effusion. Left subclavian approach cardiac rhythm maintenance device. Leads terminate within the right atrium and right ventricle. Mediastinum/Nodes: Unremarkable CT appearance of the thyroid gland. No suspicious mediastinal or hilar adenopathy. No soft tissue mediastinal mass. The thoracic esophagus is unremarkable. Lungs/Pleura: Extensive calcified pleural plaques along the anterior and posterior aspect of the right lung. 2.4 x 1.5 cm circumscribed fluid density collection within the anterolateral aspect of the right lower lobe  (image 43, series 7). This abnormality was visible on prior imaging from 02/08/2020 but was more irregular in shape at that time and associated with a region of active pneumonia. This area has decreased in size compared to the prior imaging and may represent blood products or other secretions trapped within an emphysematous bulla. No spiculated margins or architectural distortion to suggest an underlying neoplastic process. Advanced centrilobular emphysematous changes present throughout the lungs. Linear pleuroparenchymal scarring present in the anterior aspect of the left upper lobe. Additional areas of nodular airspace opacity scattered throughout the left lung demonstrate no significant interval change. No pleural effusion or pneumothorax. Musculoskeletal: No acute fracture or aggressive appearing lytic or blastic osseous lesion. Review of the MIP images confirms the above findings. CTA ABDOMEN AND PELVIS FINDINGS VASCULAR Aorta: Irregular visceral and juxtarenal abdominal aorta with multiple regions of focal out pouching likely representing sequelae of prior penetrating aortic ulcerations. The pre visceral segment measures up to 3.3 cm in diameter. The visceral segment demonstrates focal penetrating ulceration with a maximal diameter of 3.4 cm at the origin of the SMA, stable compared to 3.3 cm previously. Surgical changes of prior aorto bi iliac bypass graft, presumably for treatment of a native infrarenal abdominal aortic aneurysm. No evidence of complication at the proximal or distal anastomoses. Celiac: Patent without evidence of aneurysm, dissection, vasculitis or significant stenosis. SMA: Patent without evidence of aneurysm, dissection, vasculitis or significant stenosis. 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 Mild ectasia of the origin of the right renal artery. IMA: Occluded by prior bypass graft. Inflow: Aneurysmal dilation of the right  internal iliac artery is present measuring up to 2 cm in diameter. Mild aneurysmal dilation of  the posterior division of the right internal iliac artery with a maximal diameter of 1.5 cm. 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 Arteriomegaly of the bilateral common femoral arteries Veins: No focal venous abnormality. Review of the MIP images confirms the above findings. NON-VASCULAR Hepatobiliary: Normal hepatic contour and morphology. No discrete hepatic lesion. No biliary ductal dilatation. Numerous small stones layer dependently within the gallbladder. Pancreas: Unremarkable. No pancreatic ductal dilatation or surrounding inflammatory changes. Spleen: Normal in size without focal abnormality. Adrenals/Urinary Tract: Normal adrenal glands. No enhancing renal lesion or hydronephrosis. 3 cm simple cyst exophytic from the lower pole of the right kidney. 6 mm stone in the lower pole collecting system of the left kidney. Ureters and bladder are unremarkable. Stomach/Bowel: Colonic diverticular disease without CT evidence of active inflammation. No focal bowel wall thickening or evidence of obstruction. Lymphatic: No suspicious lymphadenopathy. Reproductive: Prostate is unremarkable. Other: No abdominal wall hernia or abnormality. No abdominopelvic ascites. Musculoskeletal: No acute fracture or aggressive appearing lytic or blastic osseous lesion. Multilevel degenerative disc disease. Review of the MIP images confirms the above findings. IMPRESSION: 1. No significant interval change in the size or appearance of multifocal aneurysmal disease including the aortic isthmus (4 cm), supra visceral abdominal aorta (3.4 cm), left renal artery origin (2.1 cm), and right internal iliac artery (2 cm) compared to prior imaging from August of 2020. 2. Stable surgical changes of prior aorto bi-iliac bypass without evidence of complicating feature. 3. Decreasing size of a circumscribed rounded fluid density opacity in  the anterolateral aspect of the right lower lobe compared to relatively recent prior CT imaging dated 02/08/2020. This likely represents some residual blood products or secretions entrapped within an emphysematous bulla. 4. Advanced pulmonary emphysema. 5. Additional ancillary findings as above without significant interval change. Signed, Sterling Big, MD, RPVI [facility] Electronically Signed   By: Malachy Moan M.D.   On: 03/02/2020 12:27   CT Angio Abd/Pel w/ and/or w/o  Result Date: 03/02/2020 CLINICAL DATA:  79 year old male with a history of thoracic aortic aneurysm. Follow-up evaluation. EXAM: CT ANGIOGRAPHY CHEST, ABDOMEN AND PELVIS TECHNIQUE: Non-contrast CT of the chest was initially obtained. Multidetector CT imaging through the chest, abdomen and pelvis was performed using the standard protocol during bolus administration of intravenous contrast. Multiplanar reconstructed images and MIPs were obtained and reviewed to evaluate the vascular anatomy. CONTRAST:  OMNIPAQUE IOHEXOL 350 MG/ML SOLN COMPARISON:  Prior CT scan of the chest 02/08/2020; prior CT scan of the chest abdomen and pelvis 03/23/2019 FINDINGS: CTA CHEST FINDINGS Cardiovascular: 3 vessel aortic arch. The aortic root and ascending thoracic aorta are normal. There is focal fusiform aneurysmal dilation at the aortic isthmus with a maximal diameter of 4 cm (unchanged compared to 03/23/2019). This is unchanged compared to prior imaging. Mild atherosclerotic plaque is present. No evidence of dissection. Unremarkable main pulmonary artery. The heart is normal in size. No pericardial effusion. Left subclavian approach cardiac rhythm maintenance device. Leads terminate within the right atrium and right ventricle. Mediastinum/Nodes: Unremarkable CT appearance of the thyroid gland. No suspicious mediastinal or hilar adenopathy. No soft tissue mediastinal mass. The thoracic  esophagus is unremarkable. Lungs/Pleura: Extensive calcified pleural plaques along the anterior and posterior aspect of the right lung. 2.4 x 1.5 cm circumscribed fluid density collection within the anterolateral aspect of the right lower lobe (image 43, series 7). This abnormality was visible on prior imaging from 02/08/2020 but was more irregular in shape at  that time and associated with a region of active pneumonia. This area has decreased in size compared to the prior imaging and may represent blood products or other secretions trapped within an emphysematous bulla. No spiculated margins or architectural distortion to suggest an underlying neoplastic process. Advanced centrilobular emphysematous changes present throughout the lungs. Linear pleuroparenchymal scarring present in the anterior aspect of the left upper lobe. Additional areas of nodular airspace opacity scattered throughout the left lung demonstrate no significant interval change. No pleural effusion or pneumothorax. Musculoskeletal: No acute fracture or aggressive appearing lytic or blastic osseous lesion. Review of the MIP images confirms the above findings. CTA ABDOMEN AND PELVIS FINDINGS VASCULAR Aorta: Irregular visceral and juxtarenal abdominal aorta with multiple regions of focal out pouching likely representing sequelae of prior penetrating aortic ulcerations. The pre visceral segment measures up to 3.3 cm in diameter. The visceral segment demonstrates focal penetrating ulceration with a maximal diameter of 3.4 cm at the origin of the SMA, stable compared to 3.3 cm previously. Surgical changes of prior aorto bi iliac bypass graft, presumably for treatment of a native infrarenal abdominal aortic aneurysm. No evidence of complication at the proximal or distal anastomoses. Celiac: Patent without evidence of aneurysm, dissection, vasculitis or significant stenosis. SMA: Patent without evidence of aneurysm, dissection, vasculitis or significant  stenosis. 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 Mild ectasia of the origin of the right renal artery. IMA: Occluded by prior bypass graft. Inflow: Aneurysmal dilation of the right internal iliac artery is present measuring up to 2 cm in diameter. Mild aneurysmal dilation of the posterior division of the right internal iliac artery with a maximal diameter of 1.5 cm. 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 Arteriomegaly of the bilateral common femoral arteries Veins: No focal venous abnormality. Review of the MIP images confirms the above findings. NON-VASCULAR Hepatobiliary: Normal hepatic contour and morphology. No discrete hepatic lesion. No biliary ductal dilatation. Numerous small stones layer dependently within the gallbladder. Pancreas: Unremarkable. No pancreatic ductal dilatation or surrounding inflammatory changes. Spleen: Normal in size without focal abnormality. Adrenals/Urinary Tract: Normal adrenal glands. No enhancing renal lesion or hydronephrosis. 3 cm simple cyst exophytic from the lower pole of the right kidney. 6 mm stone in the lower pole collecting system of the left kidney. Ureters and bladder are unremarkable. Stomach/Bowel: Colonic diverticular disease without CT evidence of active inflammation. No focal bowel wall thickening or evidence of obstruction. Lymphatic: No suspicious lymphadenopathy. Reproductive: Prostate is unremarkable. Other: No abdominal wall hernia or abnormality. No abdominopelvic ascites. Musculoskeletal: No acute fracture or aggressive appearing lytic or blastic osseous lesion. Multilevel degenerative disc disease. Review of the MIP images confirms the above findings. IMPRESSION: 1. No significant interval change in the size or appearance of multifocal aneurysmal disease including the aortic isthmus (4 cm), supra visceral abdominal aorta (3.4 cm), left renal artery origin (2.1 cm), and right  internal iliac artery (2 cm) compared to prior imaging from August of 2020. 2. Stable surgical changes of prior aorto bi-iliac bypass without evidence of complicating feature. 3. Decreasing size of a circumscribed rounded fluid density opacity in the anterolateral aspect of the right lower lobe compared to relatively recent prior CT imaging dated 02/08/2020. This likely represents some residual blood products or secretions entrapped within an emphysematous bulla. 4. Advanced pulmonary emphysema. 5. Additional ancillary findings as above without significant interval change. Signed, Sterling Big, MD, RPVI [facility]  [facility] Electronically Signed   By: Malachy Moan M.D.   On: 03/02/2020 12:27      PFT Results Latest Ref Rng & Units 11/16/2015 12/30/2013  FVC-Pre L 2.96 3.01  FVC-Predicted Pre % 59 59  FVC-Post L 2.89 3.01  FVC-Predicted Post % 58 59  Pre FEV1/FVC % % 43 45  Post FEV1/FCV % % 46 49  FEV1-Pre L 1.28 1.36  FEV1-Predicted Pre % 35 36  FEV1-Post L 1.33 1.48  DLCO uncorrected ml/min/mmHg 9.88 10.13  DLCO UNC% % 26 26  DLCO corrected ml/min/mmHg 9.94 -  DLCO COR %Predicted % 26 -  DLVA Predicted % 40 39  TLC L - 6.31  TLC % Predicted % - 80  RV % Predicted % - 80    No results found for: NITRICOXIDE      Assessment & Plan:   COPD (chronic obstructive pulmonary disease) (HCC) Currently compensated on present regimen no changes  OSA on CPAP Excellent control and compliance on nocturnal CPAP.  Will adjust CPAP pressure for comfort.  Change CPAP pressure to 20 cm H2O. CPAP download in 1 month.  Chronic respiratory failure with hypoxia (HCC) Continue on oxygen 2 L  Pneumonia Clinically improved.  Chest x-ray today.  Acute ITP (HCC) Continue follow-up with hematology.     Rubye Oaks, NP 03/15/2020

## 2020-03-15 NOTE — Assessment & Plan Note (Signed)
Continue follow up with hematology.

## 2020-03-15 NOTE — Assessment & Plan Note (Signed)
Clinically improved.  Chest x-ray today.

## 2020-03-15 NOTE — Progress Notes (Signed)
Called and spoke with the patient and provided cxr results per [facility].  He verbalized understanding.  Nothing further needed.

## 2020-03-15 NOTE — Assessment & Plan Note (Signed)
Currently compensated on present regimen no changes

## 2020-03-16 ENCOUNTER — Other Ambulatory Visit: Payer: Self-pay | Admitting: *Deleted

## 2020-03-16 DIAGNOSIS — D693 Immune thrombocytopenic purpura: Secondary | ICD-10-CM

## 2020-03-16 DIAGNOSIS — J449 Chronic obstructive pulmonary disease, unspecified: Secondary | ICD-10-CM | POA: Diagnosis not present

## 2020-03-16 DIAGNOSIS — D631 Anemia in chronic kidney disease: Secondary | ICD-10-CM

## 2020-03-18 NOTE — Progress Notes (Signed)
Patient Care Team: Myrlene Broker, MD as PCP - General (Internal Medicine) Ernesto Rutherford, MD as Consulting Physician (Ophthalmology) Kalman Shan, MD as Consulting Physician (Pulmonary Disease) System, Provider Not In as Consulting Physician Casimer Lanius, MD as Consulting Physician (Rheumatology) Oretha Milch, MD as Consulting Physician (Pulmonary Disease) Lewayne Bunting, MD as Consulting Physician (Cardiology)  DIAGNOSIS:    ICD-10-CM   1. 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 He presents to the clinic today for follow-up.   He has tapered the prednisone completely and is here today to recheck his labs.  He has not noticed any hemoptysis.  ALLERGIES:  has No Known Allergies.  MEDICATIONS:  Current Outpatient Medications  Medication Sig Dispense Refill  . albuterol (PROVENTIL) (2.5 MG/3ML) 0.083% nebulizer solution 1 vial in nebulizer every 6 hours as needed Dx 496 120 mL 6  . ALPRAZolam (XANAX) 0.5 MG tablet Take 0.5 mg by mouth at bedtime.     [name] apixaban (ELIQUIS) 5 MG TABS tablet Take 1 tablet (5 mg total) by mouth 2 (two) times daily. (Patient not taking: Reported on 02/16/2020) 180 tablet 2  . atorvastatin (LIPITOR) 40 MG tablet Take 1 tablet (40 mg total) by mouth daily. 90 tablet 3  . Carboxymethylcellul-Glycerin (LUBRICATING EYE DROPS OP) Place 1 drop into both eyes 4 (four) times daily as needed (dry eyes).     . Cholecalciferol (VITAMIN D3) 2000 units capsule Take 2,000 Units by mouth daily.     . furosemide (LASIX) 40 MG tablet Take 1 tablet (40 mg total) by mouth daily. 90 tablet 3  . insulin glargine (LANTUS) 100 UNIT/ML injection Inject 60 Units into the skin at bedtime.     [name] levothyroxine (SYNTHROID) 125 MCG tablet Take 125 mcg by mouth daily  before breakfast.     . losartan (COZAAR) 50 MG tablet Take 1 tablet (50 mg total) by mouth daily. 90 tablet 3  . metFORMIN (GLUCOPHAGE) 500 MG tablet Take 500 mg by mouth 2 (two) times daily with a meal.    . Multiple Vitamins-Minerals (MEGA MULTIVITAMIN FOR MEN PO) Take 1 tablet by mouth daily.     . Olodaterol HCl (STRIVERDI RESPIMAT) 2.5 MCG/ACT AERS Inhale 1 puff into the lungs daily.    . OXYGEN Inhale 2 L into the lungs continuous.    . predniSONE (DELTASONE) 10 MG tablet Take 1 tablet (10 mg total) by mouth daily with breakfast. 60 tablet 0  . Semaglutide (OZEMPIC) 1 MG/DOSE SOPN Inject 1 mg into the skin every Friday.     . tamsulosin (FLOMAX) 0.4 MG CAPS capsule Take 1 capsule (0.4 mg total) by mouth at bedtime. 30 capsule 0  . tiotropium (SPIRIVA HANDIHALER) 18 MCG inhalation capsule Place 1 capsule (18 mcg total) into inhaler and inhale daily. 30 capsule 4   Current Facility-Administered Medications  Medication Dose Route Frequency Provider Last Rate Last Admin  . methylPREDNISolone acetate (DEPO-MEDROL) injection 40 mg  40 mg Other Once Tyrell Antonio, MD        PHYSICAL EXAMINATION: ECOG PERFORMANCE STATUS: 2 - Symptomatic, <50% confined to bed  Vitals:   03/19/20 0955  BP: 132/68  Pulse: (!) 52  Resp: 17  Temp: (!) 96.9 F (36.1 C)  SpO2: 99%   Filed Weights   03/19/20  0955  Weight: 215 lb 8 oz (97.8 kg)    LABORATORY DATA:  I have reviewed the data as listed CMP Latest Ref Rng & Units 02/22/2020 02/08/2020 06/28/2019  Glucose 65 - 99 mg/dL 865(H) 846(N) 629(B)  BUN 8 - 27 mg/dL 28(U) 20 18  Creatinine 0.76 - 1.27 mg/dL 1.32(G) 4.01 0.27(O)  Sodium 134 - 144 mmol/L 136 139 140  Potassium 3.5 - 5.2 mmol/L 5.2 4.2 5.2  Chloride 96 - 106 mmol/L 97 103 102  CO2 20 - 29 mmol/L 25 28 25   Calcium 8.6 - 10.2 mg/dL 9.5 9.2 9.1  Total Protein 6.5 - 8.1 g/dL - - -  Total Bilirubin 0.3 - 1.2 mg/dL - - -  Alkaline Phos 38 - 126 U/L - - -  AST 15 - 41 U/L - - -  ALT 0  - 44 U/L - - -    Lab Results  Component Value Date   WBC 7.1 03/19/2020   HGB 11.0 (L) 03/19/2020   HCT 35.2 (L) 03/19/2020   MCV 86.1 03/19/2020   PLT 68 (L) 03/19/2020   NEUTROABS 5.6 03/19/2020    ASSESSMENT & PLAN:  Acute ITP (HCC) Relapsed acute ITP Prior treatment: Prednisone started 07/05/2019 completed 10/23/2019 Lab review:  02/08/2020: Platelet count 37 with blood with expectoration started on prednisone 02/15/2020: Platelet count 98 03/19/2020: Platelet count: 68  CT chest 02/08/2020: Right upper lobe and right lower lobe groundglass consolidation suggestive of pneumonia, gallstones, kidney stones, emphysema, atherosclerosis  Prednisone has been tapered. Resume prednisone at 10 mg daily for 2 weeks then go down to 5 mg daily for 2 weeks. Recheck a CBC in 2 weeks and to follow-up with labs with and to see me in 4 weeks. Patient is disappointed that his labs are coming down.  As long as it stays above 50, I do not think there is any cause for concern.    Orders Placed This Encounter  Procedures  . CBC with Differential (Cancer Center Only)    Standing Status:   Standing    Number of Occurrences:   10    Standing Expiration Date:   03/19/2021   The patient has a good understanding of the overall plan. he agrees with it. he will call with any problems that may develop before the next visit here.  Total time spent: 20 mins including face to face time and time spent for planning, charting and coordination of care  03/21/2021, MD 03/19/2020  I, 03/21/2020 Dorshimer, am acting as scribe for Dr. Kirt Boys.  I have reviewed the above documentation for accuracy and completeness, and I agree with the above.

## 2020-03-19 ENCOUNTER — Inpatient Hospital Stay: Payer: Medicare PPO | Attending: Hematology and Oncology | Admitting: Hematology and Oncology

## 2020-03-19 ENCOUNTER — Other Ambulatory Visit: Payer: Self-pay

## 2020-03-19 ENCOUNTER — Inpatient Hospital Stay: Payer: Medicare PPO

## 2020-03-19 DIAGNOSIS — Z79899 Other long term (current) drug therapy: Secondary | ICD-10-CM | POA: Insufficient documentation

## 2020-03-19 DIAGNOSIS — D693 Immune thrombocytopenic purpura: Secondary | ICD-10-CM

## 2020-03-19 DIAGNOSIS — Z7901 Long term (current) use of anticoagulants: Secondary | ICD-10-CM | POA: Diagnosis not present

## 2020-03-19 LAB — CBC WITH DIFFERENTIAL (CANCER CENTER ONLY)
Abs Immature Granulocytes: 0.03 10*3/uL (ref 0.00–0.07)
Basophils Absolute: 0 10*3/uL (ref 0.0–0.1)
Basophils Relative: 0 %
Eosinophils Absolute: 0.1 10*3/uL (ref 0.0–0.5)
Eosinophils Relative: 1 %
HCT: 35.2 % — ABNORMAL LOW (ref 39.0–52.0)
Hemoglobin: 11 g/dL — ABNORMAL LOW (ref 13.0–17.0)
Immature Granulocytes: 0 %
Lymphocytes Relative: 14 %
Lymphs Abs: 1 10*3/uL (ref 0.7–4.0)
MCH: 26.9 pg (ref 26.0–34.0)
MCHC: 31.3 g/dL (ref 30.0–36.0)
MCV: 86.1 fL (ref 80.0–100.0)
Monocytes Absolute: 0.5 10*3/uL (ref 0.1–1.0)
Monocytes Relative: 7 %
Neutro Abs: 5.6 10*3/uL (ref 1.7–7.7)
Neutrophils Relative %: 78 %
Platelet Count: 68 10*3/uL — ABNORMAL LOW (ref 150–400)
RBC: 4.09 MIL/uL — ABNORMAL LOW (ref 4.22–5.81)
RDW: 15.6 % — ABNORMAL HIGH (ref 11.5–15.5)
WBC Count: 7.1 10*3/uL (ref 4.0–10.5)
nRBC: 0 % (ref 0.0–0.2)

## 2020-03-19 MED ORDER — PREDNISONE 10 MG PO TABS
10.0000 mg | ORAL_TABLET | Freq: Every day | ORAL | 0 refills | Status: DC
Start: 2020-03-19 — End: 2020-05-14

## 2020-03-19 NOTE — Assessment & Plan Note (Signed)
Relapsed acute ITP Prior treatment: Prednisone started 07/05/2019 completed 10/23/2019 Lab review:  02/08/2020: Platelet count 37 with blood with expectoration started on prednisone 02/15/2020: Platelet count 98 03/19/2020: Platelet count:  CT chest 02/08/2020: Right upper lobe and right lower lobe groundglass consolidation suggestive of pneumonia, gallstones, kidney stones, emphysema, atherosclerosis  Prednisone has been tapered.

## 2020-03-21 DIAGNOSIS — J449 Chronic obstructive pulmonary disease, unspecified: Secondary | ICD-10-CM | POA: Diagnosis not present

## 2020-03-21 DIAGNOSIS — E860 Dehydration: Secondary | ICD-10-CM | POA: Diagnosis not present

## 2020-03-21 DIAGNOSIS — J8410 Pulmonary fibrosis, unspecified: Secondary | ICD-10-CM | POA: Diagnosis not present

## 2020-03-21 DIAGNOSIS — G4733 Obstructive sleep apnea (adult) (pediatric): Secondary | ICD-10-CM | POA: Diagnosis not present

## 2020-03-26 DIAGNOSIS — G4733 Obstructive sleep apnea (adult) (pediatric): Secondary | ICD-10-CM | POA: Diagnosis not present

## 2020-03-26 DIAGNOSIS — J8410 Pulmonary fibrosis, unspecified: Secondary | ICD-10-CM | POA: Diagnosis not present

## 2020-03-26 DIAGNOSIS — J449 Chronic obstructive pulmonary disease, unspecified: Secondary | ICD-10-CM | POA: Diagnosis not present

## 2020-03-26 DIAGNOSIS — E860 Dehydration: Secondary | ICD-10-CM | POA: Diagnosis not present

## 2020-03-29 ENCOUNTER — Telehealth: Payer: Medicare PPO | Admitting: Cardiology

## 2020-03-30 ENCOUNTER — Ambulatory Visit (INDEPENDENT_AMBULATORY_CARE_PROVIDER_SITE_OTHER): Payer: Medicare PPO | Admitting: *Deleted

## 2020-03-30 DIAGNOSIS — R55 Syncope and collapse: Secondary | ICD-10-CM

## 2020-04-01 LAB — CUP PACEART REMOTE DEVICE CHECK
Battery Remaining Longevity: 171 mo
Battery Voltage: 3.17 V
Brady Statistic AP VP Percent: 7.84 %
Brady Statistic AP VS Percent: 3.16 %
Brady Statistic AS VP Percent: 0.06 %
Brady Statistic AS VS Percent: 88.94 %
Brady Statistic RA Percent Paced: 10.97 %
Brady Statistic RV Percent Paced: 7.89 %
Date Time Interrogation Session: 20210903050725
Implantable Lead Implant Date: 20201204
Implantable Lead Implant Date: 20201204
Implantable Lead Location: 753859
Implantable Lead Location: 753860
Implantable Lead Model: 5076
Implantable Lead Model: 5076
Implantable Pulse Generator Implant Date: 20201204
Lead Channel Impedance Value: 342 Ohm
Lead Channel Impedance Value: 342 Ohm
Lead Channel Impedance Value: 399 Ohm
Lead Channel Impedance Value: 399 Ohm
Lead Channel Pacing Threshold Amplitude: 0.5 V
Lead Channel Pacing Threshold Amplitude: 0.5 V
Lead Channel Pacing Threshold Pulse Width: 0.4 ms
Lead Channel Pacing Threshold Pulse Width: 0.4 ms
Lead Channel Sensing Intrinsic Amplitude: 11.75 mV
Lead Channel Sensing Intrinsic Amplitude: 11.75 mV
Lead Channel Sensing Intrinsic Amplitude: 2.625 mV
Lead Channel Sensing Intrinsic Amplitude: 2.625 mV
Lead Channel Setting Pacing Amplitude: 1.5 V
Lead Channel Setting Pacing Amplitude: 2.5 V
Lead Channel Setting Pacing Pulse Width: 0.4 ms
Lead Channel Setting Sensing Sensitivity: 2 mV

## 2020-04-03 ENCOUNTER — Inpatient Hospital Stay: Payer: Medicare PPO | Attending: Hematology and Oncology

## 2020-04-03 ENCOUNTER — Other Ambulatory Visit: Payer: Self-pay

## 2020-04-03 DIAGNOSIS — Z794 Long term (current) use of insulin: Secondary | ICD-10-CM | POA: Insufficient documentation

## 2020-04-03 DIAGNOSIS — Z7952 Long term (current) use of systemic steroids: Secondary | ICD-10-CM | POA: Diagnosis not present

## 2020-04-03 DIAGNOSIS — D693 Immune thrombocytopenic purpura: Secondary | ICD-10-CM | POA: Insufficient documentation

## 2020-04-03 DIAGNOSIS — Z79899 Other long term (current) drug therapy: Secondary | ICD-10-CM | POA: Insufficient documentation

## 2020-04-03 LAB — CBC WITH DIFFERENTIAL (CANCER CENTER ONLY)
Abs Immature Granulocytes: 0.03 10*3/uL (ref 0.00–0.07)
Basophils Absolute: 0 10*3/uL (ref 0.0–0.1)
Basophils Relative: 0 %
Eosinophils Absolute: 0.1 10*3/uL (ref 0.0–0.5)
Eosinophils Relative: 1 %
HCT: 37.7 % — ABNORMAL LOW (ref 39.0–52.0)
Hemoglobin: 11.9 g/dL — ABNORMAL LOW (ref 13.0–17.0)
Immature Granulocytes: 0 %
Lymphocytes Relative: 14 %
Lymphs Abs: 1 10*3/uL (ref 0.7–4.0)
MCH: 27 pg (ref 26.0–34.0)
MCHC: 31.6 g/dL (ref 30.0–36.0)
MCV: 85.5 fL (ref 80.0–100.0)
Monocytes Absolute: 0.5 10*3/uL (ref 0.1–1.0)
Monocytes Relative: 7 %
Neutro Abs: 5.8 10*3/uL (ref 1.7–7.7)
Neutrophils Relative %: 78 %
Platelet Count: 81 10*3/uL — ABNORMAL LOW (ref 150–400)
RBC: 4.41 MIL/uL (ref 4.22–5.81)
RDW: 15.2 % (ref 11.5–15.5)
WBC Count: 7.4 10*3/uL (ref 4.0–10.5)
nRBC: 0 % (ref 0.0–0.2)

## 2020-04-04 NOTE — Progress Notes (Signed)
Remote pacemaker transmission.   

## 2020-04-11 ENCOUNTER — Telehealth: Payer: Self-pay | Admitting: Internal Medicine

## 2020-04-11 NOTE — Telephone Encounter (Signed)
Pt can make an appointment since his last appointment with PCP was Dec 2020. Please schedule.

## 2020-04-11 NOTE — Telephone Encounter (Signed)
Patient requesting a lab order for thyroid, feeling sluggish Would like to get the lab tomorrow

## 2020-04-13 ENCOUNTER — Ambulatory Visit: Payer: Medicare PPO | Admitting: Family

## 2020-04-13 ENCOUNTER — Other Ambulatory Visit: Payer: Self-pay

## 2020-04-13 ENCOUNTER — Encounter: Payer: Self-pay | Admitting: Family

## 2020-04-13 VITALS — BP 120/60 | HR 61 | Temp 98.0°F | Ht 74.0 in | Wt 216.0 lb

## 2020-04-13 DIAGNOSIS — R5383 Other fatigue: Secondary | ICD-10-CM

## 2020-04-13 DIAGNOSIS — E118 Type 2 diabetes mellitus with unspecified complications: Secondary | ICD-10-CM

## 2020-04-13 DIAGNOSIS — E039 Hypothyroidism, unspecified: Secondary | ICD-10-CM | POA: Diagnosis not present

## 2020-04-13 DIAGNOSIS — Z794 Long term (current) use of insulin: Secondary | ICD-10-CM | POA: Diagnosis not present

## 2020-04-13 DIAGNOSIS — E559 Vitamin D deficiency, unspecified: Secondary | ICD-10-CM | POA: Diagnosis not present

## 2020-04-13 DIAGNOSIS — E538 Deficiency of other specified B group vitamins: Secondary | ICD-10-CM

## 2020-04-13 NOTE — Progress Notes (Signed)
[name] is a 79 y.o. male with the following history as recorded in EpicCare:  Patient Active Problem List   Diagnosis Date Noted  . Hemoptysis 02/16/2020  . Syncope 10/04/2019  . LBBB (left bundle branch block) 10/04/2019  . Pacemaker 10/04/2019  . 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 Lung nodules 03/25/2019  . Acute ITP ([place]) 01/22/2018  . Symptomatic anemia 01/20/2018  . Chronic respiratory failure with hypoxia ([place]) 12/01/2017  . Lung mass 12/01/2017  . PAF (paroxysmal atrial fibrillation) ([place])   . Ischemic cardiomyopathy 09/02/2017  . Chronic systolic CHF (congestive heart failure) ([place]) 09/02/2017  . Aortic aneurysm ([place]) 09/02/2017  . Diabetes mellitus with complication, with long-term current use of insulin ([place]) 09/01/2017  . Anxiety and depression 09/01/2017  . COPD (chronic obstructive pulmonary disease) ([place]) 09/01/2017  . Pneumonia 12/26/2016  . Hyperlipidemia 01/05/2014  . OSA on CPAP 06/10/2011  . Coronary atherosclerosis 11/23/2009  . Hypothyroidism 07/30/2009  . Essential hypertension 07/30/2009    Current Outpatient Medications  Medication Sig Dispense Refill  . albuterol (PROVENTIL) (2.5 MG/3ML) 0.083% nebulizer solution 1 vial in nebulizer every 6 hours as needed Dx 496 120 mL 6  . ALPRAZolam (XANAX) 0.5 MG tablet Take 0.5 mg by mouth at bedtime.     [name] atorvastatin (LIPITOR) 40 MG tablet Take 1 tablet (40 mg total) by mouth daily. 90 tablet 3  . Carboxymethylcellul-Glycerin (LUBRICATING EYE DROPS OP) Place 1 drop into both eyes 4 (four) times daily as needed (dry eyes).     . Cholecalciferol (VITAMIN D3) 2000 units capsule Take 2,000 Units by mouth daily.     . furosemide (LASIX) 40 MG tablet Take 1 tablet (40 mg total) by mouth daily. 90 tablet 3  . insulin glargine (LANTUS) 100 UNIT/ML injection Inject 60 Units into the skin at bedtime.     [name] levothyroxine (SYNTHROID) 150 MCG tablet Take 150 mcg by mouth daily before breakfast.     . losartan (COZAAR) 50 MG tablet Take 1 tablet (50 mg total) by mouth daily. 90 tablet 3  . metFORMIN (GLUCOPHAGE) 500 MG tablet Take 500 mg by mouth 2 (two) times daily with a meal. Take 2 in the am; 1 in the pm    . Multiple Vitamins-Minerals (MEGA MULTIVITAMIN FOR MEN PO) Take 1 tablet by mouth daily.     . Olodaterol HCl (STRIVERDI RESPIMAT) 2.5 MCG/ACT AERS Inhale 1 puff into the lungs daily.    . OXYGEN Inhale 2 L into the lungs continuous.    . predniSONE (DELTASONE) 10 MG tablet Take 1 tablet (10 mg total) by mouth daily with breakfast. 60 tablet 0  . Semaglutide (OZEMPIC) 1 MG/DOSE SOPN Inject 1 mg into the skin every Friday.     . tamsulosin (FLOMAX) 0.4 MG CAPS capsule Take 1 capsule (0.4 mg total) by mouth at bedtime. 30 capsule 0  . tiotropium (SPIRIVA HANDIHALER) 18 MCG inhalation capsule Place 1 capsule (18 mcg total) into inhaler and inhale daily. 30 capsule 4  . apixaban (ELIQUIS) 5 MG TABS tablet Take 1 tablet (5 mg total) by mouth 2 (two) times daily. (Patient not taking: Reported on 04/13/2020) 180 tablet 2   Current Facility-Administered Medications  Medication Dose Route Frequency Provider Last Rate Last Admin  . methylPREDNISolone acetate (DEPO-MEDROL) injection 40 mg  40 mg Other Once Magnus Sinning, MD        Allergies: Patient has no known allergies.  Past Medical History:  Diagnosis Date  .  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 Anxiety   . Aortic aneurysm ([place]) 09/02/2017  . BENIGN PROSTATIC HYPERTROPHY 11/23/2009  . Cardiomyopathy ([place]) 08/28/2016  . Chronic systolic CHF (congestive heart failure) ([place]) 09/02/2017  . COPD (chronic obstructive pulmonary disease) ([place])   . CORONARY ARTERY DISEASE 11/23/2009  . 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 DEGENERATIVE JOINT DISEASE 11/23/2009  . DEPRESSION 11/23/2009  . FATIGUE 11/23/2009  . GAIT DISTURBANCE 12/10/2009  . HEMOPTYSIS UNSPECIFIED 05/07/2010  . High cholesterol   . HYPERTENSION 07/30/2009  .  HYPOTHYROIDISM 07/30/2009  . Ischemic cardiomyopathy 09/02/2017  . 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 On home oxygen therapy    "2-3L; 24/7" (09/10/2016)  . OSA on CPAP   . PTSD (post-traumatic stress disorder) 03/10/2012  . 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 RA (rheumatoid arthritis) ([place]) 06/11/2011   "qwhere" (09/10/2016)  . RESPIRATORY FAILURE, CHRONIC 07/31/2009  . Scleritis of both eyes 03/17/2014  . Thrombocytopenia ([place])   . 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 Type II diabetes mellitus ([place])     Past Surgical History:  Procedure Laterality Date  . 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 ABDOMINAL EXPLORATION SURGERY  02/2004   w/LOA/notes 12/10/2010; small bowel obstruction repair with adhesiolysis   . BACK SURGERY    . CARDIAC CATHETERIZATION     2 heart caths in the past.  One in 2000s showed one ulcerated plaque  [facility] medically; [facility] Archie Endo 09/05/2016  . CATARACT EXTRACTION W/ INTRAOCULAR LENS  IMPLANT, BILATERAL Bilateral 2000s  . COLECTOMY     hx of remote ileum resection due to bleeding  . COLONOSCOPY WITH PROPOFOL N/A 01/22/2018   Procedure: COLONOSCOPY WITH PROPOFOL;  Surgeon: Gatha Mayer, MD;  Location: [facility];  Service: Endoscopy;  Laterality: N/A;  . CORONARY ANGIOPLASTY WITH STENT PLACEMENT  09/10/2016  . CORONARY STENT INTERVENTION N/A 09/10/2016   Procedure: Coronary Stent Intervention;  Surgeon: Burnell Blanks, MD;  Location: [facility];  Service: Cardiovascular;  Laterality: N/A;  Distal RCA 4.0x16 Synergy  . ESOPHAGOGASTRODUODENOSCOPY (EGD) WITH PROPOFOL N/A 01/22/2018   Procedure: ESOPHAGOGASTRODUODENOSCOPY (EGD) WITH PROPOFOL;  Surgeon: Gatha Mayer, MD;  Location: [facility];  Service: Endoscopy;  Laterality: N/A;  . FEMORAL EMBOLOECTOMY Left 07/2000   with left leg ischemia; Dr. Kellie Simmering, vascular  . GANGLION CYST EXCISION Right    "wrist"; Dr. Daylene Katayama  . HOT HEMOSTASIS N/A 01/22/2018   Procedure: HOT HEMOSTASIS (ARGON  PLASMA COAGULATION/BICAP);  Surgeon: Gatha Mayer, MD;  Location: [facility];  Service: Endoscopy;  Laterality: N/A;  . LOOP RECORDER INSERTION N/A 04/25/2019   Procedure: LOOP RECORDER INSERTION;  Surgeon: Deboraha Sprang, MD;  Location: [facility];  Service: Cardiovascular;  Laterality: N/A;  . LOOP RECORDER REMOVAL N/A 07/01/2019   Procedure: LOOP RECORDER REMOVAL;  Surgeon: Deboraha Sprang, MD;  Location: [facility];  Service: Cardiovascular;  Laterality: N/A;  . LUMBAR LAMINECTOMY  1972   Dr. Collie Siad  . PACEMAKER IMPLANT N/A 07/01/2019   Procedure: PACEMAKER IMPLANT;  Surgeon: Deboraha Sprang, MD;  Location: [facility];  Service: Cardiovascular;  Laterality: N/A;  . RIGHT/LEFT HEART CATH AND CORONARY ANGIOGRAPHY N/A 09/10/2016   Procedure: Right/Left Heart Cath and Coronary Angiography;  Surgeon: Burnell Blanks, MD;  Location: [facility];  Service: Cardiovascular;  Laterality: N/A;  . TONSILLECTOMY      Family History  Problem Relation Age of Onset  .  Other Mother        gun shot    Social History   Tobacco Use  . Smoking status: Former Smoker    Packs/day: 2.50    Years: 40.00    Pack years: 100.00    Types: Cigarettes, Pipe, Cigars    Quit date: 07/28/1998    Years since quitting: 21.7  . Smokeless tobacco: Never Used  Substance Use Topics  . Alcohol use: No    Alcohol/week: 0.0 standard drinks    Subjective:  Complaining of chronic fatigue; appears to have majority of healthcare needs including labs managed through the [place]- has primary care and endocrine there; had thyroid level checked last month and Synthroid dosage was increased; Working with pulmonology for sleep apnea;  Saw endocriology last week- planning to start Jardiance 10 mg daily in addition to Glucophage, Ozempic, Lantus;   Wife passed away in 09/20/2022- does have daughter who checks on him 2 x per week;   Objective:  Vitals:   04/13/20 0843  BP: 120/60  Pulse: 61   Temp: 98 F (36.7 C)  TempSrc: Oral  SpO2: 97%  Weight: 216 lb (98 kg)  Height: 6' 2"  (1.88 m)    General: Well developed, well nourished, in no acute distress  Skin : Warm and dry.  Head: Normocephalic and atraumatic  Eyes: Sclera and conjunctiva clear; pupils round and reactive to light; extraocular movements intact  Ears: External normal; canals clear; tympanic membranes normal  Oropharynx: Pink, supple. No suspicious lesions  Neck: Supple without thyromegaly, adenopathy  Lungs: Respirations unlabored; on oxygen; CVS exam: normal rate and regular rhythm.  Neurologic: Alert and oriented; speech intact; face symmetrical; moves all extremities well; CNII-XII intact without focal deficit   Assessment:  1. Other fatigue   2. Hypothyroidism, unspecified type   3. Vitamin D deficiency   4. Low vitamin B12 level   5. Diabetes mellitus with complication, with long-term current use of insulin ([place])     Plan:  Will update labs today; continue with specialists for chronic care needs; suspect fatigue is related to chronic care needs;  Time spent 30 minutes reviewing medications/ discussing concerns;   This visit occurred during the SARS-CoV-2 public health emergency.  Safety protocols were in place, including screening questions prior to the visit, additional usage of staff PPE, and extensive cleaning of exam room while observing appropriate contact time as indicated for disinfecting solutions.     No follow-ups on file.  Orders Placed This Encounter  Procedures  . Comp Met (CMET)    Standing Status:   Future    Standing Expiration Date:   04/13/2021  . TSH    Standing Status:   Future    Standing Expiration Date:   04/13/2021  . Vitamin B12    Standing Status:   Future    Standing Expiration Date:   04/13/2021  . Vitamin D (25 hydroxy)    Standing Status:   Future    Standing Expiration Date:   04/13/2021  . Hemoglobin A1c    Standing Status:   Future    Standing Expiration  Date:   04/13/2021    Requested Prescriptions    No prescriptions requested or ordered in this encounter

## 2020-04-13 NOTE — Addendum Note (Signed)
Addended by: Merrilyn Puma on: 04/13/2020 09:15 AM   Modules accepted: Orders

## 2020-04-14 LAB — COMPREHENSIVE METABOLIC PANEL
AG Ratio: 2 (calc) (ref 1.0–2.5)
ALT: 18 U/L (ref 9–46)
AST: 19 U/L (ref 10–35)
Albumin: 4.1 g/dL (ref 3.6–5.1)
Alkaline phosphatase (APISO): 66 U/L (ref 35–144)
BUN: 22 mg/dL (ref 7–25)
CO2: 31 mmol/L (ref 20–32)
Calcium: 9.3 mg/dL (ref 8.6–10.3)
Chloride: 103 mmol/L (ref 98–110)
Creat: 1.17 mg/dL (ref 0.70–1.18)
Globulin: 2.1 g/dL (calc) (ref 1.9–3.7)
Glucose, Bld: 111 mg/dL — ABNORMAL HIGH (ref 65–99)
Potassium: 4.6 mmol/L (ref 3.5–5.3)
Sodium: 142 mmol/L (ref 135–146)
Total Bilirubin: 0.5 mg/dL (ref 0.2–1.2)
Total Protein: 6.2 g/dL (ref 6.1–8.1)

## 2020-04-14 LAB — HEMOGLOBIN A1C
Hgb A1c MFr Bld: 9.3 % of total Hgb — ABNORMAL HIGH (ref <5.7)
Mean Plasma Glucose: 220 (calc)
eAG (mmol/L): 12.2 (calc)

## 2020-04-14 LAB — VITAMIN D 25 HYDROXY (VIT D DEFICIENCY, FRACTURES): Vit D, 25-Hydroxy: 42 ng/mL (ref 30–100)

## 2020-04-14 LAB — VITAMIN B12: Vitamin B-12: 732 pg/mL (ref 200–1100)

## 2020-04-14 LAB — TSH: TSH: 2.21 mIU/L (ref 0.40–4.50)

## 2020-04-15 NOTE — Progress Notes (Signed)
Patient Care Team: Myrlene Broker, MD as PCP - General (Internal Medicine) Ernesto Rutherford, MD as Consulting Physician (Ophthalmology) Kalman Shan, MD as Consulting Physician (Pulmonary Disease) System, Provider Not In as Consulting Physician Casimer Lanius, MD as Consulting Physician (Rheumatology) Oretha Milch, MD as Consulting Physician (Pulmonary Disease) Lewayne Bunting, MD as Consulting Physician (Cardiology)  DIAGNOSIS:    ICD-10-CM   1. Acute ITP (HCC)  D69.3 CBC with Differential (Cancer Center Only)    CBC with Differential (Cancer Center Only)    CHIEF COMPLIANT: Follow-up of acute ITP  INTERVAL HISTORY: [name] is a 79 y.o. with above-mentioned history of ITPcurrently on treatment with prednisone taper. Labs on 04/03/20 showed platelets 81. He presents to the clinic todayfor follow-up.  He has not noticed any excessive bruising or bleeding.  Today's platelet count is 64.  ALLERGIES:  has No Known Allergies.  MEDICATIONS:  Current Outpatient Medications  Medication Sig Dispense Refill  . albuterol (PROVENTIL) (2.5 MG/3ML) 0.083% nebulizer solution 1 vial in nebulizer every 6 hours as needed Dx 496 120 mL 6  . ALPRAZolam (XANAX) 0.5 MG tablet Take 0.5 mg by mouth at bedtime.     [name] apixaban (ELIQUIS) 5 MG TABS tablet Take 1 tablet (5 mg total) by mouth 2 (two) times daily. (Patient not taking: Reported on 04/13/2020) 180 tablet 2  . atorvastatin (LIPITOR) 40 MG tablet Take 1 tablet (40 mg total) by mouth daily. 90 tablet 3  . Carboxymethylcellul-Glycerin (LUBRICATING EYE DROPS OP) Place 1 drop into both eyes 4 (four) times daily as needed (dry eyes).     . Cholecalciferol (VITAMIN D3) 2000 units capsule Take 2,000 Units by mouth daily.     . furosemide (LASIX) 40 MG tablet Take 1 tablet (40 mg total) by mouth daily. 90 tablet 3  . insulin glargine (LANTUS) 100 UNIT/ML injection Inject 60 Units into the skin at bedtime.     [name] levothyroxine  (SYNTHROID) 150 MCG tablet Take 150 mcg by mouth daily before breakfast.    . losartan (COZAAR) 50 MG tablet Take 1 tablet (50 mg total) by mouth daily. 90 tablet 3  . metFORMIN (GLUCOPHAGE) 500 MG tablet Take 500 mg by mouth 2 (two) times daily with a meal. Take 2 in the am; 1 in the pm    . Multiple Vitamins-Minerals (MEGA MULTIVITAMIN FOR MEN PO) Take 1 tablet by mouth daily.     . Olodaterol HCl (STRIVERDI RESPIMAT) 2.5 MCG/ACT AERS Inhale 1 puff into the lungs daily.    . OXYGEN Inhale 2 L into the lungs continuous.    . predniSONE (DELTASONE) 10 MG tablet Take 1 tablet (10 mg total) by mouth daily with breakfast. 60 tablet 0  . Semaglutide (OZEMPIC) 1 MG/DOSE SOPN Inject 1 mg into the skin every Friday.     . tamsulosin (FLOMAX) 0.4 MG CAPS capsule Take 1 capsule (0.4 mg total) by mouth at bedtime. 30 capsule 0  . tiotropium (SPIRIVA HANDIHALER) 18 MCG inhalation capsule Place 1 capsule (18 mcg total) into inhaler and inhale daily. 30 capsule 4   Current Facility-Administered Medications  Medication Dose Route Frequency Provider Last Rate Last Admin  . methylPREDNISolone acetate (DEPO-MEDROL) injection 40 mg  40 mg Other Once Tyrell Antonio, MD        PHYSICAL EXAMINATION: ECOG PERFORMANCE STATUS: 1 - Symptomatic but completely ambulatory  Vitals:   04/16/20 0933  BP: 120/87  Pulse: (!) 56  Resp: 17  Temp: (!) 96.8 F (  36 C)  SpO2: 100%   Filed Weights   04/16/20 0933  Weight: 220 lb 4.8 oz (99.9 kg)    LABORATORY DATA:  I have reviewed the data as listed CMP Latest Ref Rng & Units 04/13/2020 02/22/2020 02/08/2020  Glucose 65 - 99 mg/dL 532(D) 924(Q) 683(M)  BUN 7 - 25 mg/dL 22 19(Q) 20  Creatinine 0.70 - 1.18 mg/dL 2.22 9.79(G) 9.21  Sodium 135 - 146 mmol/L 142 136 139  Potassium 3.5 - 5.3 mmol/L 4.6 5.2 4.2  Chloride 98 - 110 mmol/L 103 97 103  CO2 20 - 32 mmol/L 31 25 28   Calcium 8.6 - 10.3 mg/dL 9.3 9.5 9.2  Total Protein 6.1 - 8.1 g/dL 6.2 - -  Total Bilirubin  0.2 - 1.2 mg/dL 0.5 - -  Alkaline Phos 38 - 126 U/L - - -  AST 10 - 35 U/L 19 - -  ALT 9 - 46 U/L 18 - -    Lab Results  Component Value Date   WBC 7.2 04/16/2020   HGB 10.7 (L) 04/16/2020   HCT 34.2 (L) 04/16/2020   MCV 85.7 04/16/2020   PLT 64 (L) 04/16/2020   NEUTROABS 5.2 04/16/2020    ASSESSMENT & PLAN:  Acute ITP (HCC) Relapsed acute ITP Prior treatment: Prednisone started 07/05/2019 completed 10/23/2019 Lab review: 02/08/2020:Platelet count 37 with blood with expectorationstarted on prednisone 02/15/2020:Platelet count 98 03/19/2020: Platelet count: 68 04/03/2020: Platelet count 81 04/16/2020: Platelet count 64  CT chest 02/08/2020: Right upper lobe and right lower lobe groundglass consolidation suggestive of pneumonia, gallstones, kidney stones, emphysema, atherosclerosis  Prednisone has been tapered.  He is currently taking 5 mg daily.  I encouraged him to continue with the 5 mg and recheck his blood count in 1 month. Current dose of prednisone: 5 mg Our goal is to keep him on the lowest dose of prednisone for as short of a time as possible to maintain his platelet count above 50  Return to clinic in 1 month with labs and follow-up    Orders Placed This Encounter  Procedures  . CBC with Differential (Cancer Center Only)    Standing Status:   Future    Number of Occurrences:   1    Standing Expiration Date:   04/16/2021  . CBC with Differential (Cancer Center Only)    Standing Status:   Future    Standing Expiration Date:   04/16/2021   The patient has a good understanding of the overall plan. he agrees with it. he will call with any problems that may develop before the next visit here.  Total time spent: 20 mins including face to face time and time spent for planning, charting and coordination of care  04/18/2021, MD 04/16/2020  I, 04/18/2020 [place], am acting as scribe for Dr. Kirt Boys.  I have reviewed the above documentation for accuracy and  completeness, and I agree with the above.

## 2020-04-16 ENCOUNTER — Telehealth: Payer: Self-pay | Admitting: Internal Medicine

## 2020-04-16 ENCOUNTER — Inpatient Hospital Stay: Payer: Medicare PPO

## 2020-04-16 ENCOUNTER — Telehealth: Payer: Self-pay | Admitting: Physical Medicine and Rehabilitation

## 2020-04-16 ENCOUNTER — Other Ambulatory Visit: Payer: Self-pay

## 2020-04-16 ENCOUNTER — Inpatient Hospital Stay: Payer: Medicare PPO | Admitting: Hematology and Oncology

## 2020-04-16 ENCOUNTER — Telehealth: Payer: Self-pay | Admitting: Hematology and Oncology

## 2020-04-16 DIAGNOSIS — D693 Immune thrombocytopenic purpura: Secondary | ICD-10-CM

## 2020-04-16 DIAGNOSIS — Z79899 Other long term (current) drug therapy: Secondary | ICD-10-CM | POA: Diagnosis not present

## 2020-04-16 DIAGNOSIS — J449 Chronic obstructive pulmonary disease, unspecified: Secondary | ICD-10-CM | POA: Diagnosis not present

## 2020-04-16 DIAGNOSIS — Z794 Long term (current) use of insulin: Secondary | ICD-10-CM | POA: Diagnosis not present

## 2020-04-16 DIAGNOSIS — Z7952 Long term (current) use of systemic steroids: Secondary | ICD-10-CM | POA: Diagnosis not present

## 2020-04-16 LAB — CBC WITH DIFFERENTIAL (CANCER CENTER ONLY)
Abs Immature Granulocytes: 0.02 10*3/uL (ref 0.00–0.07)
Basophils Absolute: 0 10*3/uL (ref 0.0–0.1)
Basophils Relative: 0 %
Eosinophils Absolute: 0.1 10*3/uL (ref 0.0–0.5)
Eosinophils Relative: 1 %
HCT: 34.2 % — ABNORMAL LOW (ref 39.0–52.0)
Hemoglobin: 10.7 g/dL — ABNORMAL LOW (ref 13.0–17.0)
Immature Granulocytes: 0 %
Lymphocytes Relative: 18 %
Lymphs Abs: 1.3 10*3/uL (ref 0.7–4.0)
MCH: 26.8 pg (ref 26.0–34.0)
MCHC: 31.3 g/dL (ref 30.0–36.0)
MCV: 85.7 fL (ref 80.0–100.0)
Monocytes Absolute: 0.6 10*3/uL (ref 0.1–1.0)
Monocytes Relative: 8 %
Neutro Abs: 5.2 10*3/uL (ref 1.7–7.7)
Neutrophils Relative %: 73 %
Platelet Count: 64 10*3/uL — ABNORMAL LOW (ref 150–400)
RBC: 3.99 MIL/uL — ABNORMAL LOW (ref 4.22–5.81)
RDW: 15 % (ref 11.5–15.5)
WBC Count: 7.2 10*3/uL (ref 4.0–10.5)
nRBC: 0 % (ref 0.0–0.2)

## 2020-04-16 NOTE — Telephone Encounter (Signed)
Pt has been informed of results and expressed understanding.  °

## 2020-04-16 NOTE — Telephone Encounter (Signed)
Scheduled appts per 9/20 los. Gave pt a print out of appt calendar.

## 2020-04-16 NOTE — Telephone Encounter (Signed)
ok 

## 2020-04-16 NOTE — Telephone Encounter (Signed)
Patient called and was asking for a call to discuss recent labs that he had done on  04/13/2020 [phone]

## 2020-04-16 NOTE — Telephone Encounter (Signed)
Patient had bilateral S1 TF on 08/25/2019. Ok to repeat if helped, same problem/side, and no new injury?

## 2020-04-16 NOTE — Telephone Encounter (Signed)
Patient called. He would like an appointment with Dr. Alvester Morin. His call back number is [phone]

## 2020-04-16 NOTE — Assessment & Plan Note (Signed)
Relapsed acute ITP Prior treatment: Prednisone started 07/05/2019 completed 10/23/2019 Lab review: 02/08/2020:Platelet count 37 with blood with expectorationstarted on prednisone 02/15/2020:Platelet count 98 03/19/2020: Platelet count: 68 04/03/2020: Platelet count 81 04/16/2020: Platelet count  CT chest 02/08/2020: Right upper lobe and right lower lobe groundglass consolidation suggestive of pneumonia, gallstones, kidney stones, emphysema, atherosclerosis  Prednisone has been tapered. Current dose of prednisone:   Return to clinic in 1 month with labs and follow-up

## 2020-04-17 NOTE — Telephone Encounter (Signed)
Patient states that his pain is only on the left. Scheduled for 10/4 at 1100 with driver.

## 2020-04-21 DIAGNOSIS — G4733 Obstructive sleep apnea (adult) (pediatric): Secondary | ICD-10-CM | POA: Diagnosis not present

## 2020-04-21 DIAGNOSIS — J449 Chronic obstructive pulmonary disease, unspecified: Secondary | ICD-10-CM | POA: Diagnosis not present

## 2020-04-21 DIAGNOSIS — E860 Dehydration: Secondary | ICD-10-CM | POA: Diagnosis not present

## 2020-04-21 DIAGNOSIS — J8410 Pulmonary fibrosis, unspecified: Secondary | ICD-10-CM | POA: Diagnosis not present

## 2020-04-26 DIAGNOSIS — J8410 Pulmonary fibrosis, unspecified: Secondary | ICD-10-CM | POA: Diagnosis not present

## 2020-04-26 DIAGNOSIS — G4733 Obstructive sleep apnea (adult) (pediatric): Secondary | ICD-10-CM | POA: Diagnosis not present

## 2020-04-26 DIAGNOSIS — J449 Chronic obstructive pulmonary disease, unspecified: Secondary | ICD-10-CM | POA: Diagnosis not present

## 2020-04-26 DIAGNOSIS — E860 Dehydration: Secondary | ICD-10-CM | POA: Diagnosis not present

## 2020-04-30 ENCOUNTER — Other Ambulatory Visit: Payer: Self-pay

## 2020-04-30 ENCOUNTER — Ambulatory Visit (INDEPENDENT_AMBULATORY_CARE_PROVIDER_SITE_OTHER): Payer: Medicare PPO | Admitting: Physical Medicine and Rehabilitation

## 2020-04-30 ENCOUNTER — Encounter: Payer: Self-pay | Admitting: Physical Medicine and Rehabilitation

## 2020-04-30 ENCOUNTER — Ambulatory Visit: Payer: Self-pay

## 2020-04-30 VITALS — BP 144/74 | HR 61

## 2020-04-30 DIAGNOSIS — M5416 Radiculopathy, lumbar region: Secondary | ICD-10-CM | POA: Diagnosis not present

## 2020-04-30 DIAGNOSIS — M961 Postlaminectomy syndrome, not elsewhere classified: Secondary | ICD-10-CM | POA: Diagnosis not present

## 2020-04-30 MED ORDER — METHYLPREDNISOLONE ACETATE 80 MG/ML IJ SUSP
40.0000 mg | Freq: Once | INTRAMUSCULAR | Status: AC
  Administered 2020-04-30: 40 mg

## 2020-04-30 NOTE — Progress Notes (Signed)
Pt state lower back pain that travels dow the his left calf. Pt state everything but sitting makes his pain worse. Pt state Pain meds helps ease the pain.  Numeric Pain Rating Scale and Functional Assessment Average Pain 6   In the last MONTH (on 0-10 scale) has pain interfered with the following?  1. General activity like being  able to carry out your everyday physical activities such as walking, climbing stairs, carrying groceries, or moving a chair?  Rating(10)   +Driver, -BT, -Dye Allergies.

## 2020-05-03 NOTE — Progress Notes (Signed)
[name] - 79 y.o. male MRN [id number]  Date of birth: February 07, 1941  Office Visit Note: Visit Date: 04/30/2020 PCP: Myrlene Broker, MD Referred by: Myrlene Broker, *  Subjective: Chief Complaint  Patient presents with  . Lower Back - Pain  . Left Leg - Pain   HPI:  [name] is a 79 y.o. male who comes in today for planned repeat Bilateral S1-2 Lumbar epidural steroid injection with fluoroscopic guidance.  The patient has failed conservative care including home exercise, medications, time and activity modification.  This injection will be diagnostic and hopefully therapeutic.  Please see requesting physician notes for further details and justification. Patient received more than 50% pain relief from prior injection.  Last time I saw the patient was in January and we completed bilateral S1 transforaminal injection he did quite well for some time.  His case is very complicated by chronic medical problems and those are detailed in the chart.  Prior referring physician was Dr. Gaspar Bidding.  Patient still undergoes exercises when he can as well as activity modification. Prior injection did offer more than 50% relief as noted above and did give the patient more functional ability for activities of daily living and was also beneficial in that it did reduce her medication requirement.  They have had physical therapy and continue with home exercises.  Current medication management is not beneficial in increasing there functional status.  Procedures are done as part of a comprehensive orthopedic and pain management program and access to in-house physical therapy as well as access to [facility] biopsychosocial counseling.   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  ROS Otherwise per HPI.  Assessment & Plan: Visit Diagnoses:  1. Lumbar radiculopathy   2. Post laminectomy syndrome     Plan: No additional  findings.   Meds & Orders:  Meds ordered this encounter  Medications  . methylPREDNISolone acetate (DEPO-MEDROL) injection 40 mg    Orders Placed This Encounter  Procedures  . XR C-ARM NO REPORT  . Epidural Steroid injection    Follow-up: Return if symptoms worsen or fail to improve.   Procedures: No procedures performed  S1 Lumbosacral Transforaminal Epidural Steroid Injection - Sub-Pedicular Approach with Fluoroscopic Guidance   Patient: [name]      Date of Birth: 10-20-1940 MRN: [id number] PCP: Myrlene Broker, MD      Visit Date: 04/30/2020   Universal Protocol:    Date/Time: 10/07/215:32 AM  Consent Given By: the patient  Position:  PRONE  Additional Comments: Vital signs were monitored before and after the procedure. Patient was prepped and draped in the usual sterile fashion. The correct patient, procedure, and site was verified.   Injection Procedure Details:  Procedure Site One Meds Administered:  Meds ordered this encounter  Medications  . methylPREDNISolone acetate (DEPO-MEDROL) injection 40 mg    Laterality: Bilateral  Location/Site:  S1 Foramen   Needle size: 22 ga.  Needle type: Spinal  Needle Placement: Transforaminal  Findings:   -Comments: Excellent flow of contrast along the nerve and into the epidural space.  Procedure Details: After squaring off the sacral end-plate to get a true AP view, the C-arm was positioned so that the best possible view of the S1 foramen was visualized. The soft tissues overlying this structure were infiltrated with 2-3 ml. of 1% Lidocaine without Epinephrine.    The spinal needle was inserted toward  the target using a "trajectory" view along the fluoroscope beam.  Under AP and lateral visualization, the needle was advanced so it did not puncture dura. Biplanar projections were used to confirm position. Aspiration was confirmed to be negative for CSF and/or blood. A 1-2 ml. volume of Isovue-250  was injected and flow of contrast was noted at each level. Radiographs were obtained for documentation purposes.   After attaining the desired flow of contrast documented above, a 0.5 to 1.0 ml test dose of 0.25% Marcaine was injected into each respective transforaminal space.  The patient was observed for 90 seconds post injection.  After no sensory deficits were reported, and normal lower extremity motor function was noted,   the above injectate was administered so that equal amounts of the injectate were placed at each foramen (level) into the transforaminal epidural space.   Additional Comments:  The patient tolerated the procedure well Dressing: Band-Aid with 2 x 2 sterile gauze    Post-procedure details: Patient was observed during the procedure. Post-procedure instructions were reviewed.  Patient left the clinic in stable condition.     Clinical History: MRI LUMBAR SPINE WITHOUT CONTRAST 12/29/2015  TECHNIQUE: Multiplanar, multisequence MR imaging of the lumbar spine was performed. No intravenous contrast was administered.  COMPARISON:  None.  FINDINGS: Segmentation: 5 non rib-bearing lumbar type vertebral bodies are present.  Alignment: Slight retrolisthesis is present at L2-3. There is straightening of the normal lumbar lordosis. AP alignment is otherwise anatomic.  Vertebrae: Minimal chronic endplate marrow changes are present at L4-5.  Conus medullaris: Extends to the T12 level and appears normal.  Paraspinal and other soft tissues: A 2.3 cm cyst is present at the lower pole of the left kidney. No other focal lesions are present. There is no significant adenopathy.  Disc levels:  T11-12: A shallow central disc protrusion is present. There is no significant stenosis.  T12-L1: A superior disc extrusion extends 15 mm above the inferior endplate of T12. This partially effaces the ventral CSF. The foramina are patent bilaterally.  L1-2: A leftward  broad-based disc protrusion is present. Mild facet hypertrophy is noted. This results in mild left central and foraminal narrowing.  L2-3: A broad-based disc protrusion is present. Moderate facet hypertrophy is evident. Moderate subarticular narrowing is noted bilaterally. Mild foraminal narrowing is present bilaterally as well.  L3-4: A broad-based disc protrusion is present. Mild facet hypertrophy is noted. Moderate left and mild right subarticular narrowing is present. Moderate left and mild right foraminal narrowing is present.  L4-5: A left laminectomy is noted. Residual moderate left and mild right subarticular narrowing is present. Moderate left and mild right foraminal stenosis is stable.  L5-S1: A prominent central disc protrusion is again noted. There is moderate subarticular and bilateral foraminal narrowing without significant interval change.  IMPRESSION: 1. 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 2. Superior disc extrusion at T12-L1 extends 15 mm above the inferior endplate of T12. 3. Mild left central and foraminal stenosis at L1-2. 4. Moderate subarticular and mild foraminal narrowing bilaterally at L2-3. 5. Moderate left and mild right subarticular and foraminal narrowing bilaterally at L3-4 and L4-5 is stable. 6. Moderate subarticular and bilateral foraminal stenosis at L5-S1 without significant interval change.     Objective:  VS:  HT:    WT:   BMI:     BP:(!) 144/74  HR:61bpm  TEMP: ( )  RESP:  Physical Exam Constitutional:      General: He is not in acute  distress.    Appearance: Normal appearance. He is not ill-appearing.  HENT:     Head: Normocephalic and atraumatic.     Right Ear: External ear normal.     Left Ear: External ear normal.     Nose:     Comments: Nasal cannula Eyes:     Extraocular Movements: Extraocular movements intact.  Cardiovascular:     Rate and Rhythm: Normal rate.     Pulses: Normal  pulses.  Abdominal:     General: There is no distension.     Palpations: Abdomen is soft.  Musculoskeletal:        General: No tenderness or signs of injury.     Right lower leg: No edema.     Left lower leg: No edema.     Comments: Patient has good distal strength without clonus.  Skin:    Findings: No erythema or rash.  Neurological:     General: No focal deficit present.     Mental Status: He is alert and oriented to person, place, and time.     Sensory: No sensory deficit.     Motor: No weakness or abnormal muscle tone.     Coordination: Coordination normal.  Psychiatric:        Mood and Affect: Mood normal.        Behavior: Behavior normal.      Imaging: No results found.

## 2020-05-03 NOTE — Procedures (Signed)
S1 Lumbosacral Transforaminal Epidural Steroid Injection - Sub-Pedicular Approach with Fluoroscopic Guidance   Patient: [name]      Date of Birth: 1941-06-14 MRN: [id number] PCP: Myrlene Broker, MD      Visit Date: 04/30/2020   Universal Protocol:    Date/Time: 10/07/215:32 AM  Consent Given By: the patient  Position:  PRONE  Additional Comments: Vital signs were monitored before and after the procedure. Patient was prepped and draped in the usual sterile fashion. The correct patient, procedure, and site was verified.   Injection Procedure Details:  Procedure Site One Meds Administered:  Meds ordered this encounter  Medications  . methylPREDNISolone acetate (DEPO-MEDROL) injection 40 mg    Laterality: Bilateral  Location/Site:  S1 Foramen   Needle size: 22 ga.  Needle type: Spinal  Needle Placement: Transforaminal  Findings:   -Comments: Excellent flow of contrast along the nerve and into the epidural space.  Procedure Details: After squaring off the sacral end-plate to get a true AP view, the C-arm was positioned so that the best possible view of the S1 foramen was visualized. The soft tissues overlying this structure were infiltrated with 2-3 ml. of 1% Lidocaine without Epinephrine.    The spinal needle was inserted toward the target using a "trajectory" view along the fluoroscope beam.  Under AP and lateral visualization, the needle was advanced so it did not puncture dura. Biplanar projections were used to confirm position. Aspiration was confirmed to be negative for CSF and/or blood. A 1-2 ml. volume of Isovue-250 was injected and flow of contrast was noted at each level. Radiographs were obtained for documentation purposes.   After attaining the desired flow of contrast documented above, a 0.5 to 1.0 ml test dose of 0.25% Marcaine was injected into each respective transforaminal space.  The patient was observed for 90 seconds post injection.   After no sensory deficits were reported, and normal lower extremity motor function was noted,   the above injectate was administered so that equal amounts of the injectate were placed at each foramen (level) into the transforaminal epidural space.   Additional Comments:  The patient tolerated the procedure well Dressing: Band-Aid with 2 x 2 sterile gauze    Post-procedure details: Patient was observed during the procedure. Post-procedure instructions were reviewed.  Patient left the clinic in stable condition.

## 2020-05-13 NOTE — Progress Notes (Signed)
Patient Care Team: Myrlene Broker, MD as PCP - General (Internal Medicine) Ernesto Rutherford, MD as Consulting Physician (Ophthalmology) Kalman Shan, MD as Consulting Physician (Pulmonary Disease) System, Provider Not In as Consulting Physician Casimer Lanius, MD as Consulting Physician (Rheumatology) Oretha Milch, MD as Consulting Physician (Pulmonary Disease) Lewayne Bunting, MD as Consulting Physician (Cardiology)  DIAGNOSIS:    ICD-10-CM   1. Acute ITP (HCC)  D69.3 CBC with Differential (Cancer Center Only)    CHIEF COMPLIANT: Follow-up ofacute ITP  INTERVAL HISTORY: [name] is a 79 y.o. with above-mentioned history of ITPcurrently on treatment with prednisonetaper. He presents to the clinic todayfor follow-up.   He is tolerating prednisone fairly well.  He is blood sugars are being managed actively.  He is currently on Jardiance.  He tells me that the Eliquis was discontinued.  He was on Eliquis for blood clot for over a year.  Uses oxygen 24/7.  ALLERGIES:  has No Known Allergies.  MEDICATIONS:  Current Outpatient Medications  Medication Sig Dispense Refill  . albuterol (PROVENTIL) (2.5 MG/3ML) 0.083% nebulizer solution 1 vial in nebulizer every 6 hours as needed Dx 496 120 mL 6  . ALPRAZolam (XANAX) 0.5 MG tablet Take 0.5 mg by mouth at bedtime.     [name] atorvastatin (LIPITOR) 40 MG tablet Take 1 tablet (40 mg total) by mouth daily. 90 tablet 3  . Carboxymethylcellul-Glycerin (LUBRICATING EYE DROPS OP) Place 1 drop into both eyes 4 (four) times daily as needed (dry eyes).     . Cholecalciferol (VITAMIN D3) 2000 units capsule Take 2,000 Units by mouth daily.     . furosemide (LASIX) 40 MG tablet Take 1 tablet (40 mg total) by mouth daily. 90 tablet 3  . insulin glargine (LANTUS) 100 UNIT/ML injection Inject 60 Units into the skin at bedtime.     [name] levothyroxine (SYNTHROID) 150 MCG tablet Take 150 mcg by mouth daily before breakfast.    . losartan  (COZAAR) 50 MG tablet Take 1 tablet (50 mg total) by mouth daily. 90 tablet 3  . metFORMIN (GLUCOPHAGE) 500 MG tablet Take 500 mg by mouth 2 (two) times daily with a meal. Take 2 in the am; 1 in the pm    . Multiple Vitamins-Minerals (MEGA MULTIVITAMIN FOR MEN PO) Take 1 tablet by mouth daily.     . Olodaterol HCl (STRIVERDI RESPIMAT) 2.5 MCG/ACT AERS Inhale 1 puff into the lungs daily.    . OXYGEN Inhale 2 L into the lungs continuous.    . predniSONE (DELTASONE) 10 MG tablet Take 0.5 tablets (5 mg total) by mouth daily with breakfast. 60 tablet 0  . tamsulosin (FLOMAX) 0.4 MG CAPS capsule Take 1 capsule (0.4 mg total) by mouth at bedtime. 30 capsule 0  . tiotropium (SPIRIVA HANDIHALER) 18 MCG inhalation capsule Place 1 capsule (18 mcg total) into inhaler and inhale daily. 30 capsule 4   No current facility-administered medications for this visit.    PHYSICAL EXAMINATION: ECOG PERFORMANCE STATUS: 1 - Symptomatic but completely ambulatory  Vitals:   05/14/20 0859  BP: 125/68  Pulse: (!) 102  Resp: 17  Temp: 97.6 F (36.4 C)  SpO2: 93%   Filed Weights   05/14/20 0859  Weight: 217 lb 4.8 oz (98.6 kg)    LABORATORY DATA:  I have reviewed the data as listed CMP Latest Ref Rng & Units 04/13/2020 02/22/2020 02/08/2020  Glucose 65 - 99 mg/dL 161(W) 960(A) 540(J)  BUN 7 - 25 mg/dL 22  28(H) 20  Creatinine 0.70 - 1.18 mg/dL 1.91 4.78(G) 9.56  Sodium 135 - 146 mmol/L 142 136 139  Potassium 3.5 - 5.3 mmol/L 4.6 5.2 4.2  Chloride 98 - 110 mmol/L 103 97 103  CO2 20 - 32 mmol/L 31 25 28   Calcium 8.6 - 10.3 mg/dL 9.3 9.5 9.2  Total Protein 6.1 - 8.1 g/dL 6.2 - -  Total Bilirubin 0.2 - 1.2 mg/dL 0.5 - -  Alkaline Phos 38 - 126 U/L - - -  AST 10 - 35 U/L 19 - -  ALT 9 - 46 U/L 18 - -    Lab Results  Component Value Date   WBC 7.5 05/14/2020   HGB 11.5 (L) 05/14/2020   HCT 36.7 (L) 05/14/2020   MCV 84.4 05/14/2020   PLT 71 (L) 05/14/2020   NEUTROABS 5.4 05/14/2020    ASSESSMENT &  PLAN:  Acute ITP (HCC) Relapsed acute ITP Prior treatment: Prednisone started 07/05/2019 completed 10/23/2019 Lab review: 02/08/2020:Platelet count 37 with blood with expectorationstarted on prednisone 02/15/2020:Platelet count 98 03/19/2020: Platelet count:68 04/03/2020: Platelet count 81 04/16/2020: Platelet count 64 (prednisone dose lowered to 5 mg) 05/14/2020: Platelet count 71, hemoglobin 11.5  CT chest 02/08/2020: Right upper lobe and right lower lobe groundglass consolidation suggestive of pneumonia, gallstones, kidney stones, emphysema, atherosclerosis  Prednisone has been tapered.  He is currently taking 5 mg daily.  I encouraged him to continue with the 5 mg and recheck his blood count in 1 month. Current dose of prednisone: 5 mg Our goal is to keep him on the lowest dose of prednisone for as short of a time as possible to maintain his platelet count above 50 We will continue prednisone for another month at 5 mg daily and then switch to every other day prednisone.  Return to clinic in 1 month with labs and follow-up    Orders Placed This Encounter  Procedures  . CBC with Differential (Cancer Center Only)    Standing Status:   Future    Standing Expiration Date:   05/14/2021   The patient has a good understanding of the overall plan. he agrees with it. he will call with any problems that may develop before the next visit here.  Total time spent: 30 mins including face to face time and time spent for planning, charting and coordination of care  05/16/2021, MD 05/14/2020  I, 05/16/2020 [place], am acting as scribe for Dr. Kirt Boys.  I have reviewed the above documentation for accuracy and completeness, and I agree with the above.

## 2020-05-14 ENCOUNTER — Inpatient Hospital Stay: Payer: Medicare PPO

## 2020-05-14 ENCOUNTER — Inpatient Hospital Stay: Payer: Medicare PPO | Attending: Hematology and Oncology | Admitting: Hematology and Oncology

## 2020-05-14 ENCOUNTER — Other Ambulatory Visit: Payer: Self-pay

## 2020-05-14 DIAGNOSIS — Z7901 Long term (current) use of anticoagulants: Secondary | ICD-10-CM | POA: Diagnosis not present

## 2020-05-14 DIAGNOSIS — D693 Immune thrombocytopenic purpura: Secondary | ICD-10-CM

## 2020-05-14 DIAGNOSIS — Z79899 Other long term (current) drug therapy: Secondary | ICD-10-CM | POA: Insufficient documentation

## 2020-05-14 LAB — CBC WITH DIFFERENTIAL (CANCER CENTER ONLY)
Abs Immature Granulocytes: 0.02 10*3/uL (ref 0.00–0.07)
Basophils Absolute: 0 10*3/uL (ref 0.0–0.1)
Basophils Relative: 0 %
Eosinophils Absolute: 0.1 10*3/uL (ref 0.0–0.5)
Eosinophils Relative: 1 %
HCT: 36.7 % — ABNORMAL LOW (ref 39.0–52.0)
Hemoglobin: 11.5 g/dL — ABNORMAL LOW (ref 13.0–17.0)
Immature Granulocytes: 0 %
Lymphocytes Relative: 18 %
Lymphs Abs: 1.3 10*3/uL (ref 0.7–4.0)
MCH: 26.4 pg (ref 26.0–34.0)
MCHC: 31.3 g/dL (ref 30.0–36.0)
MCV: 84.4 fL (ref 80.0–100.0)
Monocytes Absolute: 0.6 10*3/uL (ref 0.1–1.0)
Monocytes Relative: 8 %
Neutro Abs: 5.4 10*3/uL (ref 1.7–7.7)
Neutrophils Relative %: 73 %
Platelet Count: 71 10*3/uL — ABNORMAL LOW (ref 150–400)
RBC: 4.35 MIL/uL (ref 4.22–5.81)
RDW: 14.6 % (ref 11.5–15.5)
WBC Count: 7.5 10*3/uL (ref 4.0–10.5)
nRBC: 0 % (ref 0.0–0.2)

## 2020-05-14 MED ORDER — PREDNISONE 10 MG PO TABS
5.0000 mg | ORAL_TABLET | Freq: Every day | ORAL | 0 refills | Status: DC
Start: 2020-05-14 — End: 2020-07-09

## 2020-05-14 NOTE — Assessment & Plan Note (Signed)
Relapsed acute ITP Prior treatment: Prednisone started 07/05/2019 completed 10/23/2019 Lab review: 02/08/2020:Platelet count 37 with blood with expectorationstarted on prednisone 02/15/2020:Platelet count 98 03/19/2020: Platelet count:68 04/03/2020: Platelet count 81 04/16/2020: Platelet count 64  CT chest 02/08/2020: Right upper lobe and right lower lobe groundglass consolidation suggestive of pneumonia, gallstones, kidney stones, emphysema, atherosclerosis  Prednisone has been tapered.  He is currently taking 5 mg daily.  I encouraged him to continue with the 5 mg and recheck his blood count in 1 month. Current dose of prednisone: 5 mg Our goal is to keep him on the lowest dose of prednisone for as short of a time as possible to maintain his platelet count above 50  Return to clinic in 1 month with labs and follow-up

## 2020-05-16 DIAGNOSIS — J449 Chronic obstructive pulmonary disease, unspecified: Secondary | ICD-10-CM | POA: Diagnosis not present

## 2020-05-21 DIAGNOSIS — J449 Chronic obstructive pulmonary disease, unspecified: Secondary | ICD-10-CM | POA: Diagnosis not present

## 2020-05-21 DIAGNOSIS — J8410 Pulmonary fibrosis, unspecified: Secondary | ICD-10-CM | POA: Diagnosis not present

## 2020-05-21 DIAGNOSIS — E860 Dehydration: Secondary | ICD-10-CM | POA: Diagnosis not present

## 2020-05-21 DIAGNOSIS — G4733 Obstructive sleep apnea (adult) (pediatric): Secondary | ICD-10-CM | POA: Diagnosis not present

## 2020-05-23 NOTE — Progress Notes (Signed)
HPI: FU CAD.Cardiac cath 2/18 showed 30 LM, 50 LAD, aneurysmal RCA with 80 mid to distal; had DES to RCA at that time.Carotid dopplers 2/19 showed no significant stenosis. Admitted with sepsis 2/19 and also with atrial fibrillation, converted spontaneously to sinus; TSH low and synthroid adjusted. Placed on amiodarone.Amiodarone discontinued at previous office visit. Echocardiogram August 2020 showed normal LV function. Had pacemaker placed 12/20 due to history of syncope and pauses noted on monitor. 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 Apixaban on hold. CTA August 2021 showed dilated aortic root at 4 cm, abdominal aorta measuring 3.4 cm, left renal artery measuring 2.1 cm and right internal iliac artery measuring 2 cm. Since last seen,he has some dyspnea on exertion unchanged.  No orthopnea, PND, palpitations or syncope.  Minimal pedal edema.  No exertional chest pain.  Current Outpatient Medications  Medication Sig Dispense Refill  . albuterol (PROVENTIL) (2.5 MG/3ML) 0.083% nebulizer solution 1 vial in nebulizer every 6 hours as needed Dx 496 120 mL 6  . ALPRAZolam (XANAX) 0.5 MG tablet Take 0.5 mg by mouth at bedtime.     [name] atorvastatin (LIPITOR) 40 MG tablet Take 1 tablet (40 mg total) by mouth daily. 90 tablet 3  . Carboxymethylcellul-Glycerin (LUBRICATING EYE DROPS OP) Place 1 drop into both eyes 4 (four) times daily as needed (dry eyes).     . Cholecalciferol (VITAMIN D3) 2000 units capsule Take 2,000 Units by mouth daily.     . empagliflozin (JARDIANCE) 10 MG TABS tablet Take 5 mg by mouth daily. Take 1/2 tablet daily (5 mg). Filled at [place].    . furosemide (LASIX) 40 MG tablet Take 1 tablet (40 mg total) by mouth daily. 90 tablet 3  . insulin glargine (LANTUS) 100 UNIT/ML injection Inject 60 Units into the skin at bedtime.     [name] levothyroxine (SYNTHROID) 150 MCG tablet Take 150 mcg by mouth daily before breakfast.    . losartan (COZAAR) 50 MG tablet Take  1 tablet (50 mg total) by mouth daily. 90 tablet 3  . metFORMIN (GLUCOPHAGE) 500 MG tablet Take 500 mg by mouth 2 (two) times daily with a meal. Take 2 in the am; 1 in the pm    . Multiple Vitamins-Minerals (MEGA MULTIVITAMIN FOR MEN PO) Take 1 tablet by mouth daily.     . Olodaterol HCl (STRIVERDI RESPIMAT) 2.5 MCG/ACT AERS Inhale 1 puff into the lungs daily.    . OXYGEN Inhale 2 L into the lungs continuous.    . predniSONE (DELTASONE) 10 MG tablet Take 0.5 tablets (5 mg total) by mouth daily with breakfast. 60 tablet 0  . Semaglutide (OZEMPIC, 1 MG/DOSE, [place]) Inject into the skin as directed.    . tamsulosin (FLOMAX) 0.4 MG CAPS capsule Take 1 capsule (0.4 mg total) by mouth at bedtime. 30 capsule 0  . tiotropium (SPIRIVA HANDIHALER) 18 MCG inhalation capsule Place 1 capsule (18 mcg total) into inhaler and inhale daily. 30 capsule 4   No current facility-administered medications for this visit.     Past Medical History:  Diagnosis Date  . 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 Anxiety   . Aortic aneurysm (HCC) 09/02/2017  . BENIGN PROSTATIC HYPERTROPHY 11/23/2009  . Cardiomyopathy (HCC) 08/28/2016  . Chronic systolic CHF (congestive heart failure) (HCC) 09/02/2017  . COPD (chronic obstructive pulmonary disease) (HCC)   . CORONARY ARTERY DISEASE 11/23/2009  . 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 DEGENERATIVE JOINT  DISEASE 11/23/2009  . DEPRESSION 11/23/2009  . FATIGUE 11/23/2009  . GAIT DISTURBANCE 12/10/2009  . HEMOPTYSIS UNSPECIFIED 05/07/2010  . High cholesterol   . HYPERTENSION 07/30/2009  . HYPOTHYROIDISM 07/30/2009  . Ischemic cardiomyopathy 09/02/2017  . 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 On home oxygen therapy    "2-3L; 24/7" (09/10/2016)  . OSA on CPAP   . PTSD (post-traumatic stress disorder) 03/10/2012  . 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 RA (rheumatoid arthritis) (HCC) 06/11/2011   "qwhere" (09/10/2016)  . RESPIRATORY FAILURE, CHRONIC 07/31/2009  . Scleritis of both  eyes 03/17/2014  . Thrombocytopenia (HCC)   . 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 Type II diabetes mellitus (HCC)     Past Surgical History:  Procedure Laterality Date  . 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 ABDOMINAL EXPLORATION SURGERY  02/2004   w/LOA/notes 12/10/2010; small bowel obstruction repair with adhesiolysis   . BACK SURGERY    . CARDIAC CATHETERIZATION     2 heart caths in the past.  One in 2000s showed one ulcerated plaque  Rx medically; Second at [facility] [name] 09/05/2016  . CATARACT EXTRACTION W/ INTRAOCULAR LENS  IMPLANT, BILATERAL Bilateral 2000s  . COLECTOMY     hx of remote ileum resection due to bleeding  . COLONOSCOPY WITH PROPOFOL N/A 01/22/2018   Procedure: COLONOSCOPY WITH PROPOFOL;  Surgeon: Iva Boop, MD;  Location: [facility];  Service: Endoscopy;  Laterality: N/A;  . CORONARY ANGIOPLASTY WITH STENT PLACEMENT  09/10/2016  . CORONARY STENT INTERVENTION N/A 09/10/2016   Procedure: Coronary Stent Intervention;  Surgeon: Kathleene Hazel, MD;  Location: [facility] INVASIVE CV LAB;  Service: Cardiovascular;  Laterality: N/A;  Distal RCA 4.0x16 Synergy  . ESOPHAGOGASTRODUODENOSCOPY (EGD) WITH PROPOFOL N/A 01/22/2018   Procedure: ESOPHAGOGASTRODUODENOSCOPY (EGD) WITH PROPOFOL;  Surgeon: Iva Boop, MD;  Location: [facility];  Service: Endoscopy;  Laterality: N/A;  . FEMORAL EMBOLOECTOMY Left 07/2000   with left leg ischemia; Dr. Hart Rochester, vascular  . GANGLION CYST EXCISION Right    "wrist"; Dr. Teressa Senter  . HOT HEMOSTASIS N/A 01/22/2018   Procedure: HOT HEMOSTASIS (ARGON PLASMA COAGULATION/BICAP);  Surgeon: Iva Boop, MD;  Location: [facility];  Service: Endoscopy;  Laterality: N/A;  . LOOP RECORDER INSERTION N/A 04/25/2019   Procedure: LOOP RECORDER INSERTION;  Surgeon: Duke Salvia, MD;  Location: [facility] INVASIVE CV LAB;  Service: Cardiovascular;  Laterality: N/A;  . LOOP RECORDER REMOVAL N/A 07/01/2019   Procedure: LOOP RECORDER REMOVAL;  Surgeon:  Duke Salvia, MD;  Location: [facility] INVASIVE CV LAB;  Service: Cardiovascular;  Laterality: N/A;  . LUMBAR LAMINECTOMY  1972   Dr. Fannie Knee  . PACEMAKER IMPLANT N/A 07/01/2019   Procedure: PACEMAKER IMPLANT;  Surgeon: Duke Salvia, MD;  Location: [facility] INVASIVE CV LAB;  Service: Cardiovascular;  Laterality: N/A;  . RIGHT/LEFT HEART CATH AND CORONARY ANGIOGRAPHY N/A 09/10/2016   Procedure: Right/Left Heart Cath and Coronary Angiography;  Surgeon: Kathleene Hazel, MD;  Location: [facility] INVASIVE CV LAB;  Service: Cardiovascular;  Laterality: N/A;  . TONSILLECTOMY      Social History   Socioeconomic History  . Marital status: Widowed    Spouse name: Not on file  . Number of children: Not on file  . Years of education: Not on file  . Highest education level: Not on file  Occupational History  . Occupation: disabled Cytogeneticist, Ex Neurosurgeon: RETIRED  Tobacco Use  . Smoking status: Former Smoker    Packs/day:  2.50    Years: 40.00    Pack years: 100.00    Types: Cigarettes, Pipe, Cigars    Quit date: 07/28/1998    Years since quitting: 21.8  . Smokeless tobacco: Never Used  Vaping Use  . Vaping Use: Never used  Substance and Sexual Activity  . Alcohol use: No    Alcohol/week: 0.0 standard drinks  . Drug use: No  . Sexual activity: Not Currently  Other Topics Concern  . Not on file  Social History Narrative   Lives in the home with his wife   Sees [place] every 6 month for meds.   Denies asbestos exposure   Social Determinants of Corporate investment banker Strain:   . Difficulty of Paying Living Expenses: Not on file  Food Insecurity:   . Worried About Programme researcher, broadcasting/film/video in the Last Year: Not on file  . Ran Out of Food in the Last Year: Not on file  Transportation Needs:   . Lack of Transportation (Medical): Not on file  . Lack of Transportation (Non-Medical): Not on file  Physical Activity:   . Days of Exercise per Week: Not on file  . Minutes of Exercise per  Session: Not on file  Stress:   . Feeling of Stress : Not on file  Social Connections:   . Frequency of Communication with Friends and Family: Not on file  . Frequency of Social Gatherings with Friends and Family: Not on file  . Attends Religious Services: Not on file  . Active Member of Clubs or Organizations: Not on file  . Attends Banker Meetings: Not on file  . Marital Status: Not on file  Intimate Partner Violence:   . Fear of Current or Ex-Partner: Not on file  . Emotionally Abused: Not on file  . Physically Abused: Not on file  . Sexually Abused: Not on file    Family History  Problem Relation Age of Onset  . Other Mother        gun shot    ROS: no fevers or chills, productive cough, hemoptysis, dysphasia, odynophagia, melena, hematochezia, dysuria, hematuria, rash, seizure activity, orthopnea, PND, pedal edema, claudication. Remaining systems are negative.  Physical Exam: Well-developed well-nourished in no acute distress.  Skin is warm and dry.  HEENT is normal.  Neck is supple.  Chest with diminished breath sounds throughout Cardiovascular exam is irregular Abdominal exam nontender or distended. No masses palpated. Extremities show trace edema. neuro grossly intact  ECG-normal sinus rhythm with PACs, left bundle branch block.  First-degree AV block.  Personally reviewed  A/P  1 coronary artery disease-no chest pain.  Plan to continue statin.  He is off of aspirin due to ITP and history of hemoptysis.  2 paroxysmal atrial fibrillation-patient is in sinus rhythm.  His apixaban remains on hold given ITP and hemoptysis.  We will consider resuming in the future if platelet count improves.  3 hypertension-blood pressure controlled.  Continue present medical regimen.  4 hyperlipidemia-continue statin.  5 history of cardiomyopathy-LV function has improved on most recent echocardiogram.  6 thoracic/abdominal/iliac aneurysms-plan repeat CTA August  2022.  7 chronic combined systolic/diastolic congestive heart failure-he will continue Lasix as needed.  Volume status appears to be reasonable.  8 history of pulmonary nodule/COPD-managed by pulmonary.  9 history of ITP-follow-up hematology.  10 previous pacemaker-followed by electrophysiology.  Olga Millers, MD

## 2020-05-26 DIAGNOSIS — J449 Chronic obstructive pulmonary disease, unspecified: Secondary | ICD-10-CM | POA: Diagnosis not present

## 2020-05-26 DIAGNOSIS — G4733 Obstructive sleep apnea (adult) (pediatric): Secondary | ICD-10-CM | POA: Diagnosis not present

## 2020-05-26 DIAGNOSIS — J8410 Pulmonary fibrosis, unspecified: Secondary | ICD-10-CM | POA: Diagnosis not present

## 2020-05-26 DIAGNOSIS — E860 Dehydration: Secondary | ICD-10-CM | POA: Diagnosis not present

## 2020-05-30 ENCOUNTER — Ambulatory Visit: Payer: Medicare PPO | Admitting: Cardiology

## 2020-05-30 ENCOUNTER — Encounter: Payer: Self-pay | Admitting: Cardiology

## 2020-05-30 ENCOUNTER — Other Ambulatory Visit: Payer: Self-pay

## 2020-05-30 VITALS — BP 130/60 | HR 70 | Ht 74.0 in | Wt 219.4 lb

## 2020-05-30 DIAGNOSIS — I712 Thoracic aortic aneurysm, without rupture, unspecified: Secondary | ICD-10-CM

## 2020-05-30 DIAGNOSIS — I10 Essential (primary) hypertension: Secondary | ICD-10-CM

## 2020-05-30 DIAGNOSIS — I2510 Atherosclerotic heart disease of native coronary artery without angina pectoris: Secondary | ICD-10-CM | POA: Diagnosis not present

## 2020-05-30 DIAGNOSIS — E7800 Pure hypercholesterolemia, unspecified: Secondary | ICD-10-CM

## 2020-05-30 DIAGNOSIS — I480 Paroxysmal atrial fibrillation: Secondary | ICD-10-CM | POA: Diagnosis not present

## 2020-05-30 NOTE — Patient Instructions (Signed)

## 2020-05-31 DIAGNOSIS — M79643 Pain in unspecified hand: Secondary | ICD-10-CM | POA: Diagnosis not present

## 2020-05-31 DIAGNOSIS — J449 Chronic obstructive pulmonary disease, unspecified: Secondary | ICD-10-CM | POA: Diagnosis not present

## 2020-05-31 DIAGNOSIS — D693 Immune thrombocytopenic purpura: Secondary | ICD-10-CM | POA: Diagnosis not present

## 2020-05-31 DIAGNOSIS — Z79899 Other long term (current) drug therapy: Secondary | ICD-10-CM | POA: Diagnosis not present

## 2020-05-31 DIAGNOSIS — M0579 Rheumatoid arthritis with rheumatoid factor of multiple sites without organ or systems involvement: Secondary | ICD-10-CM | POA: Diagnosis not present

## 2020-05-31 DIAGNOSIS — D649 Anemia, unspecified: Secondary | ICD-10-CM | POA: Diagnosis not present

## 2020-05-31 DIAGNOSIS — M1990 Unspecified osteoarthritis, unspecified site: Secondary | ICD-10-CM | POA: Diagnosis not present

## 2020-05-31 DIAGNOSIS — H15009 Unspecified scleritis, unspecified eye: Secondary | ICD-10-CM | POA: Diagnosis not present

## 2020-05-31 DIAGNOSIS — R911 Solitary pulmonary nodule: Secondary | ICD-10-CM | POA: Diagnosis not present

## 2020-06-07 ENCOUNTER — Telehealth: Payer: Self-pay | Admitting: Physical Medicine and Rehabilitation

## 2020-06-07 NOTE — Telephone Encounter (Signed)
Patient called requesting a call back to set appt for left leg/buttocks injection. Please call patient back at [phone].

## 2020-06-07 NOTE — Telephone Encounter (Signed)
Called pt lvm #1.  

## 2020-06-08 ENCOUNTER — Ambulatory Visit: Payer: Medicare PPO | Admitting: Adult Health

## 2020-06-08 ENCOUNTER — Encounter: Payer: Self-pay | Admitting: Adult Health

## 2020-06-08 ENCOUNTER — Telehealth: Payer: Self-pay

## 2020-06-08 ENCOUNTER — Other Ambulatory Visit: Payer: Self-pay

## 2020-06-08 ENCOUNTER — Ambulatory Visit (INDEPENDENT_AMBULATORY_CARE_PROVIDER_SITE_OTHER): Payer: Medicare PPO

## 2020-06-08 VITALS — BP 150/70 | HR 72 | Temp 97.3°F | Ht 74.0 in | Wt 212.4 lb

## 2020-06-08 DIAGNOSIS — I5022 Chronic systolic (congestive) heart failure: Secondary | ICD-10-CM | POA: Diagnosis not present

## 2020-06-08 DIAGNOSIS — J189 Pneumonia, unspecified organism: Secondary | ICD-10-CM

## 2020-06-08 DIAGNOSIS — G4733 Obstructive sleep apnea (adult) (pediatric): Secondary | ICD-10-CM | POA: Diagnosis not present

## 2020-06-08 DIAGNOSIS — Z23 Encounter for immunization: Secondary | ICD-10-CM | POA: Diagnosis not present

## 2020-06-08 DIAGNOSIS — J90 Pleural effusion, not elsewhere classified: Secondary | ICD-10-CM | POA: Diagnosis not present

## 2020-06-08 DIAGNOSIS — R911 Solitary pulmonary nodule: Secondary | ICD-10-CM

## 2020-06-08 DIAGNOSIS — J449 Chronic obstructive pulmonary disease, unspecified: Secondary | ICD-10-CM

## 2020-06-08 DIAGNOSIS — Z9989 Dependence on other enabling machines and devices: Secondary | ICD-10-CM

## 2020-06-08 DIAGNOSIS — J9611 Chronic respiratory failure with hypoxia: Secondary | ICD-10-CM

## 2020-06-08 DIAGNOSIS — R5381 Other malaise: Secondary | ICD-10-CM

## 2020-06-08 DIAGNOSIS — D693 Immune thrombocytopenic purpura: Secondary | ICD-10-CM | POA: Diagnosis not present

## 2020-06-08 DIAGNOSIS — J9811 Atelectasis: Secondary | ICD-10-CM | POA: Diagnosis not present

## 2020-06-08 DIAGNOSIS — R918 Other nonspecific abnormal finding of lung field: Secondary | ICD-10-CM | POA: Diagnosis not present

## 2020-06-08 NOTE — Assessment & Plan Note (Signed)
Continue on oxygen 2 L.  No increased oxygen demands. 

## 2020-06-08 NOTE — Patient Instructions (Addendum)
Continue on Spiriva and Striverdi daily , rinse after use .  Continue on Oxygen 2l/m  Continue on CPAP At bedtime  With oxygen . Wear with Naps.  Chest xray today .  Activity as tolerated.  Follow up in 3 months with Dr. Vassie Loll or Kaysen Sefcik NP .  Please contact office for sooner follow up if symptoms do not improve or worsen or seek emergency care

## 2020-06-08 NOTE — Telephone Encounter (Signed)
Pt was approve Auth #[id number].

## 2020-06-08 NOTE — Telephone Encounter (Signed)
Pt called back and inform me that he would like another inj. Pt had an Left S1 TF. Pt state that the inj helped for a little while and his aches started back.

## 2020-06-08 NOTE — Telephone Encounter (Signed)
ok 

## 2020-06-08 NOTE — Assessment & Plan Note (Signed)
Compensated on present regimen.  Plan  Patient Instructions  Continue on Spiriva and Striverdi daily , rinse after use .  Continue on Oxygen 2l/m  Continue on CPAP At bedtime  With oxygen . Wear with Naps.  Chest xray today .  Activity as tolerated.  Follow up in 3 months with Dr. Vassie Loll or Zaide Kardell NP .  Please contact office for sooner follow up if symptoms do not improve or worsen or seek emergency care

## 2020-06-08 NOTE — Progress Notes (Signed)
Called and spoke with patient, advised of cxr results and recommendations by Rubye Oaks NP.  He verbalized understanding.  Nothing further needed.

## 2020-06-08 NOTE — Progress Notes (Signed)
@Patient  ID: , male    DOB: [birth year], 79 y.o.   MRN: [id number]  Chief Complaint  Patient presents with  . Follow-up    COPD     Referring provider: [id number], *  HPI: 79 year old male former smoker followed for very severe COPD, chronic respiratory failure on oxygen at 2 L.  Patient has obstructive sleep apnea on nocturnal CPAP.  Lung nodules that are followed serially on CT chest. Medical history significant for rheumatoid arthritis (no maintenance medications), diabetes, congestive heart failure (diagnosed 2018 with echo showing EF 35 to 40%,) coronary artery disease status post RCA stent in 2018, chronic kidney disease, A. fib previously on amiodarone which was stopped in August 2019.  Previously on Eliquis stopped in July 2021.  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  Gets his medications through the January 2021 system as he was in the [place]  TEST/EVENTS :  CT was done on September 13, 2019 showed a slightly decreased cavitary left apical nodule measuring 1.0 cm. Irregular subsolid left lower lobe pulmonary nodules are stable largest at 1.8 cm. There were no new or enlarging pulmonary nodules. We discussed a follow-up CT chest in 1 year.  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 [place]  06/08/2020 Follow up : COPD, oxygen dependent respiratory failure, obstructive sleep apnea, ITP Patient returns for a 90-month follow-up.  Overall patient says he is doing better.  He had had a pneumonia earlier this year with episode of hemoptysis felt to be secondary to pneumonia and relapsed acute ITP.  During this time.  He was treated with antibiotics.  Eliquis continues to be on hold.  He has been restarted on prednisone.  CT chest showed a new airspace consolidation along the right  lung and lingula.  Also an infected bullous lesion in the right lower lobe.  Patient says cough and congestion have improved.  He feels that he is more back to his baseline.  He gets short of breath with activities but is trying to stay active.  He is able to do some light yard work with several rest breaks.  He does his own house chores.  He lives alone as his wife passed away this past year. He remains on Spiriva and Striverdi denies any increased albuterol use.  Chest x-ray last visit showed improved opacities.   Patient has obstructive sleep apnea is on nocturnal CPAP.  Says he is doing well on CPAP.  He never misses a night.  Wears it each night about 6 to 7 hours.  CPAP download shows excellent compliance with 100% usage.  Daily average usage at 6.5 hours.  Patient is on auto CPAP 10 to 20 cm H2O.  Average daily pressure at 16 cm H2O.  AHI 5.2.  Minimal leaks.  Appetite is fair. Tries to be active in yard. Bought some lightweights to use at home.   Covid vaccine x 3 . Wants flu shot today.   Followed with hematology for ITP. Had relapse, now on slow steroid taper. Currently on prednisone 5mg  daily .   Patient follows with cardiology for coronary disease and congestive heart failure.  Says lower extremity swelling has been doing well.  He denies any chest pain.  As above he remains off of Eliquis due to relapsed acute ITP.  He denies any hemoptysis  or known bleeding.  Remains on oxygen 2 L.  No increased oxygen demands.   No Known Allergies  Immunization History  Administered Date(s) Administered  . Fluad Quad(high Dose 65+) 04/26/2019, 06/08/2020  . Influenza Split 04/14/2011, 04/27/2013  . Influenza Whole 05/28/2009, 04/27/2010, 03/31/2012  . Influenza, High Dose Seasonal PF 03/21/2015, 05/16/2017, 04/08/2018  . Influenza,inj,Quad PF,6+ Mos 03/17/2014, 04/04/2016  . Influenza-Unspecified 05/26/2011, 05/07/2012  . PFIZER SARS-COV-2 Vaccination 08/19/2019, 09/09/2019, 04/08/2020  .  Pneumococcal Conjugate-13 09/20/2013  . Pneumococcal Polysaccharide-23 05/28/2008, 04/25/2010  . Pneumococcal-Unspecified 04/27/2013  . Td 11/23/2009  . Zoster 04/28/2009    Past Medical History:  Diagnosis Date  . 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 Anxiety   . Aortic aneurysm (HCC) 09/02/2017  . BENIGN PROSTATIC HYPERTROPHY 11/23/2009  . Cardiomyopathy (HCC) 08/28/2016  . Chronic systolic CHF (congestive heart failure) (HCC) 09/02/2017  . COPD (chronic obstructive pulmonary disease) (HCC)   . CORONARY ARTERY DISEASE 11/23/2009  . 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 DEGENERATIVE JOINT DISEASE 11/23/2009  . DEPRESSION 11/23/2009  . FATIGUE 11/23/2009  . GAIT DISTURBANCE 12/10/2009  . HEMOPTYSIS UNSPECIFIED 05/07/2010  . High cholesterol   . HYPERTENSION 07/30/2009  . HYPOTHYROIDISM 07/30/2009  . Ischemic cardiomyopathy 09/02/2017  . 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 On home oxygen therapy    "2-3L; 24/7" (09/10/2016)  . OSA on CPAP   . PTSD (post-traumatic stress disorder) 03/10/2012  . 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 RA (rheumatoid arthritis) (HCC) 06/11/2011   "qwhere" (09/10/2016)  . RESPIRATORY FAILURE, CHRONIC 07/31/2009  . Scleritis of both eyes 03/17/2014  . Thrombocytopenia (HCC)   . 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 Type II diabetes mellitus (HCC)     Tobacco History: Social History   Tobacco Use  Smoking Status Former Smoker  . Packs/day: 2.50  . Years: 40.00  . Pack years: 100.00  . Types: Cigarettes, Pipe, Cigars  . Quit date: 07/28/1998  . Years since quitting: 21.8  Smokeless Tobacco Never Used   Counseling given: Not Answered   Outpatient Medications Prior to Visit  Medication Sig Dispense Refill  . albuterol (PROVENTIL) (2.5 MG/3ML) 0.083% nebulizer solution 1 vial in nebulizer every 6 hours as needed Dx 496 120 mL 6  . ALPRAZolam (XANAX) 0.5 MG tablet Take 0.5 mg by mouth at bedtime.     [name] atorvastatin (LIPITOR) 40 MG tablet Take 1 tablet (40 mg total)  by mouth daily. 90 tablet 3  . Carboxymethylcellul-Glycerin (LUBRICATING EYE DROPS OP) Place 1 drop into both eyes 4 (four) times daily as needed (dry eyes).     . Cholecalciferol (VITAMIN D3) 2000 units capsule Take 2,000 Units by mouth daily.     . empagliflozin (JARDIANCE) 10 MG TABS tablet Take 5 mg by mouth daily. Take 1/2 tablet daily (5 mg). Filled at [place].    . insulin glargine (LANTUS) 100 UNIT/ML injection Inject 60 Units into the skin at bedtime.     [name] levothyroxine (SYNTHROID) 150 MCG tablet Take 150 mcg by mouth daily before breakfast.    . losartan (COZAAR) 50 MG tablet Take 1 tablet (50 mg total) by mouth daily. 90 tablet 3  . metFORMIN (GLUCOPHAGE) 500 MG tablet Take 500 mg by mouth 2 (two) times daily with a meal. Take 2 in the am; 1 in the pm    . Multiple Vitamins-Minerals (MEGA MULTIVITAMIN FOR MEN PO) Take 1 tablet by mouth daily.     . Olodaterol HCl (STRIVERDI RESPIMAT) 2.5 MCG/ACT AERS Inhale 1  puff into the lungs daily.    . OXYGEN Inhale 2 L into the lungs continuous.    . predniSONE (DELTASONE) 10 MG tablet Take 0.5 tablets (5 mg total) by mouth daily with breakfast. 60 tablet 0  . Semaglutide (OZEMPIC, 1 MG/DOSE, [place]) Inject into the skin as directed.    . tamsulosin (FLOMAX) 0.4 MG CAPS capsule Take 1 capsule (0.4 mg total) by mouth at bedtime. 30 capsule 0  . tiotropium (SPIRIVA HANDIHALER) 18 MCG inhalation capsule Place 1 capsule (18 mcg total) into inhaler and inhale daily. 30 capsule 4  . furosemide (LASIX) 40 MG tablet Take 1 tablet (40 mg total) by mouth daily. 90 tablet 3   No facility-administered medications prior to visit.     Review of Systems:   Constitutional:   No  weight loss, night sweats,  Fevers, chills,  +fatigue, or  lassitude.  HEENT:   No headaches,  Difficulty swallowing,  Tooth/dental problems, or  Sore throat,                No sneezing, itching, ear ache, nasal congestion, post nasal drip,   CV:  No chest pain,  Orthopnea, PND,  swelling in lower extremities, anasarca, dizziness, palpitations, syncope.   GI  No heartburn, indigestion, abdominal pain, nausea, vomiting, diarrhea, change in bowel habits, loss of appetite, bloody stools.   Resp:  .  No chest wall deformity  Skin: no rash or lesions.  GU: no dysuria, change in color of urine, no urgency or frequency.  No flank pain, no hematuria   MS:  No joint pain or swelling.  No decreased range of motion.  No back pain.    Physical Exam  BP (!) 150/70 (BP Location: Left Arm, Cuff Size: Normal)   Pulse 72   Temp (!) 97.3 F (36.3 C) (Temporal)   Ht 6\' 2"  (1.88 m)   Wt 212 lb 6.4 oz (96.3 kg)   SpO2 90%   BMI 27.27 kg/m   GEN: A/Ox3; pleasant , NAD, well nourished, on oxygen, cane   HEENT:  [place]/AT,   , NOSE-clear, THROAT-clear, no lesions, no postnasal drip or exudate noted.   NECK:  Supple w/ fair ROM; no JVD; normal carotid impulses w/o bruits; no thyromegaly or nodules palpated; no lymphadenopathy.    RESP  Clear  P & A; w/o, wheezes/ rales/ or rhonchi. no accessory muscle use, no dullness to percussion  CARD:  RRR, no m/r/g, no peripheral edema, pulses intact, no cyanosis or clubbing.  GI:   Soft & nt; nml bowel sounds; no organomegaly or masses detected.   Musco: Warm bil, no deformities or joint swelling noted.   Neuro: alert, no focal deficits noted.    Skin: Warm, no lesions or rashes    Lab Results:  CBC    Component Value Date/Time   WBC 7.5 05/14/2020 0842   WBC 7.8 02/10/2020 0851   RBC 4.35 05/14/2020 0842   HGB 11.5 (L) 05/14/2020 0842   HGB 12.0 (L) 06/28/2019 1453   HCT 36.7 (L) 05/14/2020 0842   HCT 38.4 06/28/2019 1453   PLT 71 (L) 05/14/2020 0842   PLT 44 (LL) 06/28/2019 1453   MCV 84.4 05/14/2020 0842   MCV 89 06/28/2019 1453   MCH 26.4 05/14/2020 0842   MCHC 31.3 05/14/2020 0842   RDW 14.6 05/14/2020 0842   RDW 13.8 06/28/2019 1453   LYMPHSABS 1.3 05/14/2020 0842   LYMPHSABS 1.2 12/23/2016 1053   MONOABS  0.6 05/14/2020  0842   EOSABS 0.1 05/14/2020 0842   EOSABS 0.3 12/23/2016 1053   BASOSABS 0.0 05/14/2020 0842   BASOSABS 0.0 12/23/2016 1053    BMET    Component Value Date/Time   NA 142 04/13/2020 0915   NA 136 02/22/2020 1029   K 4.6 04/13/2020 0915   CL 103 04/13/2020 0915   CO2 31 04/13/2020 0915   GLUCOSE 111 (H) 04/13/2020 0915   BUN 22 04/13/2020 0915   BUN 28 (H) 02/22/2020 1029   CREATININE 1.17 04/13/2020 0915   CALCIUM 9.3 04/13/2020 0915   GFRNONAA 50 (L) 02/22/2020 1029   GFRNONAA >60 07/15/2018 1059   GFRAA 58 (L) 02/22/2020 1029   GFRAA >60 07/15/2018 1059    BNP No results found for: BNP  ProBNP    Component Value Date/Time   PROBNP 188 10/20/2016 1038   PROBNP 59.9 05/04/2011 0517    Imaging: DG Chest 2 View  Result Date: 06/08/2020 CLINICAL DATA:  Prior pneumonia and empyema EXAM: CHEST - 2 VIEW COMPARISON:  March 15, 2020 chest radiograph and chest CT March 02, 2020 FINDINGS: A chronic small pleural effusion on the right is stable. There is calcification consistent with prior empyema on the right with areas of scarring and interstitial thickening. There is suspected loculated fluid along the right major fissure, stable. There is scarring in the left base. No new opacity is evident. Heart size and pulmonary vascularity are normal. Pacemaker leads are attached to the right atrium and right ventricle. No adenopathy is appreciable by radiography. No bone lesions. IMPRESSION: Evidence of prior empyema on the right with calcification. Scarring and interstitial thickening in this area is stable. Chronic small right pleural effusion. Mild left base atelectasis. No new opacity evident. Stable cardiac silhouette. Pacemaker leads attached to right atrium and right ventricle. Electronically Signed   By: Bretta Bang III M.D.   On: 06/08/2020 09:59    methylPREDNISolone acetate (DEPO-MEDROL) injection 40 mg    Date Action Dose Route User   04/30/2020 1123 Given   Other Tyrell Antonio, MD      PFT Results Latest Ref Rng & Units 11/16/2015 12/30/2013  FVC-Pre L 2.96 3.01  FVC-Predicted Pre % 59 59  FVC-Post L 2.89 3.01  FVC-Predicted Post % 58 59  Pre FEV1/FVC % % 43 45  Post FEV1/FCV % % 46 49  FEV1-Pre L 1.28 1.36  FEV1-Predicted Pre % 35 36  FEV1-Post L 1.33 1.48  DLCO uncorrected ml/min/mmHg 9.88 10.13  DLCO UNC% % 26 26  DLCO corrected ml/min/mmHg 9.94 -  DLCO COR %Predicted % 26 -  DLVA Predicted % 40 39  TLC L - 6.31  TLC % Predicted % - 80  RV % Predicted % - 80    No results found for: NITRICOXIDE      Assessment & Plan:   COPD (chronic obstructive pulmonary disease) (HCC) Compensated on present regimen.  Plan  Patient Instructions  Continue on Spiriva and Striverdi daily , rinse after use .  Continue on Oxygen 2l/m  Continue on CPAP At bedtime  With oxygen . Wear with Naps.  Chest xray today .  Activity as tolerated.  Follow up in 3 months with Dr. Vassie Loll or Chenika Nevils NP .  Please contact office for sooner follow up if symptoms do not improve or worsen or seek emergency care       Chronic respiratory failure with hypoxia (HCC) Continue on oxygen 2 L.  No increased oxygen demands  Chronic systolic CHF (  congestive heart failure) (HCC) Appears compensated with no evidence of overt failure on exam.  Continue follow-up with cardiology.  Acute ITP (HCC) Relapsed acute ITP.  Appears to be improving on steroids.  Continue follow-up with hematology and recommendations for slow prednisone taper.  Lung nodules Continue follow-up on serial CT chest.  Last CT was in August 2021.  Check chest x-ray today.  Has a planned annual follow-up of CT in August 2022 as cardiology follows known aneurysm. Pending chest x-ray results we will decide if sooner CT follow-up is indicated  OSA on CPAP Excellent control and compliance on nocturnal CPAP.  Continue on CPAP at bedtime with oxygen.  Physical deconditioning Patient is  encouraged on activities as tolerated.  Light exercises if able.  Core strengthening exercises discussed such as light walking and standing from a sitting position.     Rubye Oaks, NP 06/08/2020

## 2020-06-08 NOTE — Telephone Encounter (Signed)
Called and sch 12/1

## 2020-06-08 NOTE — Assessment & Plan Note (Signed)
Appears compensated with no evidence of overt failure on exam.  Continue follow-up with cardiology.

## 2020-06-08 NOTE — Assessment & Plan Note (Signed)
Patient is encouraged on activities as tolerated.  Light exercises if able.  Core strengthening exercises discussed such as light walking and standing from a sitting position.

## 2020-06-08 NOTE — Assessment & Plan Note (Signed)
Excellent control and compliance on nocturnal CPAP.  Continue on CPAP at bedtime with oxygen.

## 2020-06-08 NOTE — Telephone Encounter (Signed)
Called pt and lvm #2 

## 2020-06-08 NOTE — Assessment & Plan Note (Signed)
Continue follow-up on serial CT chest.  Last CT was in August 2021.  Check chest x-ray today.  Has a planned annual follow-up of CT in August 2022 as cardiology follows known aneurysm. Pending chest x-ray results we will decide if sooner CT follow-up is indicated

## 2020-06-08 NOTE — Assessment & Plan Note (Signed)
Relapsed acute ITP.  Appears to be improving on steroids.  Continue follow-up with hematology and recommendations for slow prednisone taper.

## 2020-06-10 NOTE — Progress Notes (Signed)
Patient Care Team: Myrlene Broker, MD as PCP - General (Internal Medicine) Ernesto Rutherford, MD as Consulting Physician (Ophthalmology) Kalman Shan, MD as Consulting Physician (Pulmonary Disease) System, Provider Not In as Consulting Physician Casimer Lanius, MD as Consulting Physician (Rheumatology) Oretha Milch, MD as Consulting Physician (Pulmonary Disease) Lewayne Bunting, MD as Consulting Physician (Cardiology)  DIAGNOSIS:    ICD-10-CM   1. Acute ITP (HCC)  D69.3     CHIEF COMPLIANT: Follow-up ofacute ITP  INTERVAL HISTORY: [name] is a 79 y.o. with above-mentioned history of ITPcurrently on treatment with prednisone 5 mg daily.He presents to the clinic todayfor follow-up. He has not noticed any excessive bruising or bleeding.  He continues to use oxygen 24/7.  ALLERGIES:  has No Known Allergies.  MEDICATIONS:  Current Outpatient Medications  Medication Sig Dispense Refill  . albuterol (PROVENTIL) (2.5 MG/3ML) 0.083% nebulizer solution 1 vial in nebulizer every 6 hours as needed Dx 496 120 mL 6  . ALPRAZolam (XANAX) 0.5 MG tablet Take 0.5 mg by mouth at bedtime.     [name] atorvastatin (LIPITOR) 40 MG tablet Take 1 tablet (40 mg total) by mouth daily. 90 tablet 3  . Carboxymethylcellul-Glycerin (LUBRICATING EYE DROPS OP) Place 1 drop into both eyes 4 (four) times daily as needed (dry eyes).     . Cholecalciferol (VITAMIN D3) 2000 units capsule Take 2,000 Units by mouth daily.     . empagliflozin (JARDIANCE) 10 MG TABS tablet Take 5 mg by mouth daily. Take 1/2 tablet daily (5 mg). Filled at [place].    . furosemide (LASIX) 40 MG tablet Take 1 tablet (40 mg total) by mouth daily. 90 tablet 3  . insulin glargine (LANTUS) 100 UNIT/ML injection Inject 60 Units into the skin at bedtime.     [name] levothyroxine (SYNTHROID) 150 MCG tablet Take 150 mcg by mouth daily before breakfast.    . losartan (COZAAR) 50 MG tablet Take 1 tablet (50 mg total) by mouth daily. 90  tablet 3  . metFORMIN (GLUCOPHAGE) 500 MG tablet Take 500 mg by mouth 2 (two) times daily with a meal. Take 2 in the am; 1 in the pm    . Multiple Vitamins-Minerals (MEGA MULTIVITAMIN FOR MEN PO) Take 1 tablet by mouth daily.     . Olodaterol HCl (STRIVERDI RESPIMAT) 2.5 MCG/ACT AERS Inhale 1 puff into the lungs daily.    . OXYGEN Inhale 2 L into the lungs continuous.    . predniSONE (DELTASONE) 10 MG tablet Take 0.5 tablets (5 mg total) by mouth daily with breakfast. 60 tablet 0  . Semaglutide (OZEMPIC, 1 MG/DOSE, [place]) Inject into the skin as directed.    . tamsulosin (FLOMAX) 0.4 MG CAPS capsule Take 1 capsule (0.4 mg total) by mouth at bedtime. 30 capsule 0  . tiotropium (SPIRIVA HANDIHALER) 18 MCG inhalation capsule Place 1 capsule (18 mcg total) into inhaler and inhale daily. 30 capsule 4   No current facility-administered medications for this visit.    PHYSICAL EXAMINATION: ECOG PERFORMANCE STATUS: 1 - Symptomatic but completely ambulatory  Vitals:   06/11/20 0852  BP: 125/68  Pulse: 72  Resp: 18  Temp: (!) 97.5 F (36.4 C)  SpO2: 96%   Filed Weights   06/11/20 0852  Weight: 215 lb 8 oz (97.8 kg)    LABORATORY DATA:  I have reviewed the data as listed CMP Latest Ref Rng & Units 04/13/2020 02/22/2020 02/08/2020  Glucose 65 - 99 mg/dL 378(H) 885(O) 277(A)  BUN  7 - 25 mg/dL 22 03(J) 20  Creatinine 0.70 - 1.18 mg/dL 0.09 3.81(W) 2.99  Sodium 135 - 146 mmol/L 142 136 139  Potassium 3.5 - 5.3 mmol/L 4.6 5.2 4.2  Chloride 98 - 110 mmol/L 103 97 103  CO2 20 - 32 mmol/L 31 25 28   Calcium 8.6 - 10.3 mg/dL 9.3 9.5 9.2  Total Protein 6.1 - 8.1 g/dL 6.2 - -  Total Bilirubin 0.2 - 1.2 mg/dL 0.5 - -  Alkaline Phos 38 - 126 U/L - - -  AST 10 - 35 U/L 19 - -  ALT 9 - 46 U/L 18 - -    Lab Results  Component Value Date   WBC 6.1 06/11/2020   HGB 11.5 (L) 06/11/2020   HCT 37.5 (L) 06/11/2020   MCV 84.7 06/11/2020   PLT 54 (L) 06/11/2020   NEUTROABS 3.3 06/11/2020    ASSESSMENT  & PLAN:  Acute ITP (HCC) Relapsed acute ITP Prior treatment: Prednisone started 07/05/2019 completed 10/23/2019 Lab review: 02/08/2020:Platelet count 37 with blood with expectorationstarted on prednisone 02/15/2020:Platelet count 98 03/19/2020: Platelet count:68 04/03/2020: Platelet count 81 04/16/2020: Platelet count64 (prednisone dose lowered to 5 mg) 05/14/2020: Platelet count 71, hemoglobin 11.5 06/11/2020: Platelet count 54 hemoglobin 11.5  Current dose of prednisone:5 mg Our goal is to keep him on the lowest dose of prednisonefor asshort of a time as possible to maintain his platelet count above 50.     We are unable to reduce the dose of prednisone at this time.  He will come back in 2 weeks for labs.  I will see him in 4 weeks with labs and follow-up.   No orders of the defined types were placed in this encounter.  The patient has a good understanding of the overall plan. he agrees with it. he will call with any problems that may develop before the next visit here.  Total time spent: 20 mins including face to face time and time spent for planning, charting and coordination of care  06/13/2020, MD 06/11/2020  I, 06/13/2020 [place], am acting as scribe for Dr. Kirt Boys.  I have reviewed the above documentation for accuracy and completeness, and I agree with the above.

## 2020-06-11 ENCOUNTER — Other Ambulatory Visit: Payer: Self-pay

## 2020-06-11 ENCOUNTER — Inpatient Hospital Stay: Payer: Medicare PPO

## 2020-06-11 ENCOUNTER — Inpatient Hospital Stay: Payer: Medicare PPO | Attending: Hematology and Oncology | Admitting: Hematology and Oncology

## 2020-06-11 ENCOUNTER — Telehealth: Payer: Self-pay | Admitting: Hematology and Oncology

## 2020-06-11 DIAGNOSIS — D693 Immune thrombocytopenic purpura: Secondary | ICD-10-CM | POA: Diagnosis not present

## 2020-06-11 DIAGNOSIS — Z79899 Other long term (current) drug therapy: Secondary | ICD-10-CM | POA: Insufficient documentation

## 2020-06-11 LAB — CBC WITH DIFFERENTIAL (CANCER CENTER ONLY)
Abs Immature Granulocytes: 0.01 10*3/uL (ref 0.00–0.07)
Basophils Absolute: 0 10*3/uL (ref 0.0–0.1)
Basophils Relative: 1 %
Eosinophils Absolute: 0.1 10*3/uL (ref 0.0–0.5)
Eosinophils Relative: 2 %
HCT: 37.5 % — ABNORMAL LOW (ref 39.0–52.0)
Hemoglobin: 11.5 g/dL — ABNORMAL LOW (ref 13.0–17.0)
Immature Granulocytes: 0 %
Lymphocytes Relative: 34 %
Lymphs Abs: 2 10*3/uL (ref 0.7–4.0)
MCH: 26 pg (ref 26.0–34.0)
MCHC: 30.7 g/dL (ref 30.0–36.0)
MCV: 84.7 fL (ref 80.0–100.0)
Monocytes Absolute: 0.6 10*3/uL (ref 0.1–1.0)
Monocytes Relative: 10 %
Neutro Abs: 3.3 10*3/uL (ref 1.7–7.7)
Neutrophils Relative %: 53 %
Platelet Count: 54 10*3/uL — ABNORMAL LOW (ref 150–400)
RBC: 4.43 MIL/uL (ref 4.22–5.81)
RDW: 14.6 % (ref 11.5–15.5)
WBC Count: 6.1 10*3/uL (ref 4.0–10.5)
nRBC: 0 % (ref 0.0–0.2)

## 2020-06-11 NOTE — Telephone Encounter (Signed)
Scheduled appts per 11/15 los. Gave pt a print out of AVS.  °

## 2020-06-11 NOTE — Assessment & Plan Note (Signed)
Relapsed acute ITP Prior treatment: Prednisone started 07/05/2019 completed 10/23/2019 Lab review: 02/08/2020:Platelet count 37 with blood with expectorationstarted on prednisone 02/15/2020:Platelet count 98 03/19/2020: Platelet count:68 04/03/2020: Platelet count 81 04/16/2020: Platelet count64 (prednisone dose lowered to 5 mg) 05/14/2020: Platelet count 71, hemoglobin 11.5 06/11/2020:  Current dose of prednisone:5 mg Our goal is to keep him on the lowest dose of prednisonefor asshort of a time as possible to maintain his platelet count above 50.  Therefore we will cut down his prednisone to 5 mg every other day for another month and then discontinue it.  Return to clinic in 2 months with labs and follow-up

## 2020-06-12 NOTE — Telephone Encounter (Signed)
Error

## 2020-06-16 DIAGNOSIS — J449 Chronic obstructive pulmonary disease, unspecified: Secondary | ICD-10-CM | POA: Diagnosis not present

## 2020-06-21 DIAGNOSIS — G4733 Obstructive sleep apnea (adult) (pediatric): Secondary | ICD-10-CM | POA: Diagnosis not present

## 2020-06-21 DIAGNOSIS — E860 Dehydration: Secondary | ICD-10-CM | POA: Diagnosis not present

## 2020-06-21 DIAGNOSIS — J449 Chronic obstructive pulmonary disease, unspecified: Secondary | ICD-10-CM | POA: Diagnosis not present

## 2020-06-21 DIAGNOSIS — J8410 Pulmonary fibrosis, unspecified: Secondary | ICD-10-CM | POA: Diagnosis not present

## 2020-06-25 ENCOUNTER — Inpatient Hospital Stay: Payer: Medicare PPO

## 2020-06-25 ENCOUNTER — Other Ambulatory Visit: Payer: Self-pay

## 2020-06-25 DIAGNOSIS — D693 Immune thrombocytopenic purpura: Secondary | ICD-10-CM

## 2020-06-25 DIAGNOSIS — Z79899 Other long term (current) drug therapy: Secondary | ICD-10-CM | POA: Diagnosis not present

## 2020-06-25 LAB — CBC WITH DIFFERENTIAL (CANCER CENTER ONLY)
Abs Immature Granulocytes: 0.02 10*3/uL (ref 0.00–0.07)
Basophils Absolute: 0 10*3/uL (ref 0.0–0.1)
Basophils Relative: 0 %
Eosinophils Absolute: 0.1 10*3/uL (ref 0.0–0.5)
Eosinophils Relative: 2 %
HCT: 37.5 % — ABNORMAL LOW (ref 39.0–52.0)
Hemoglobin: 11.7 g/dL — ABNORMAL LOW (ref 13.0–17.0)
Immature Granulocytes: 0 %
Lymphocytes Relative: 21 %
Lymphs Abs: 1.4 10*3/uL (ref 0.7–4.0)
MCH: 26.8 pg (ref 26.0–34.0)
MCHC: 31.2 g/dL (ref 30.0–36.0)
MCV: 86 fL (ref 80.0–100.0)
Monocytes Absolute: 0.8 10*3/uL (ref 0.1–1.0)
Monocytes Relative: 11 %
Neutro Abs: 4.5 10*3/uL (ref 1.7–7.7)
Neutrophils Relative %: 66 %
Platelet Count: 38 10*3/uL — ABNORMAL LOW (ref 150–400)
RBC: 4.36 MIL/uL (ref 4.22–5.81)
RDW: 15 % (ref 11.5–15.5)
WBC Count: 6.9 10*3/uL (ref 4.0–10.5)
nRBC: 0 % (ref 0.0–0.2)

## 2020-06-26 DIAGNOSIS — J449 Chronic obstructive pulmonary disease, unspecified: Secondary | ICD-10-CM | POA: Diagnosis not present

## 2020-06-26 DIAGNOSIS — J8410 Pulmonary fibrosis, unspecified: Secondary | ICD-10-CM | POA: Diagnosis not present

## 2020-06-26 DIAGNOSIS — G4733 Obstructive sleep apnea (adult) (pediatric): Secondary | ICD-10-CM | POA: Diagnosis not present

## 2020-06-26 DIAGNOSIS — E860 Dehydration: Secondary | ICD-10-CM | POA: Diagnosis not present

## 2020-06-27 ENCOUNTER — Encounter: Payer: Self-pay | Admitting: Physical Medicine and Rehabilitation

## 2020-06-27 ENCOUNTER — Other Ambulatory Visit: Payer: Self-pay

## 2020-06-27 ENCOUNTER — Ambulatory Visit: Payer: Self-pay

## 2020-06-27 ENCOUNTER — Ambulatory Visit: Payer: Medicare PPO | Admitting: Physical Medicine and Rehabilitation

## 2020-06-27 VITALS — BP 147/78 | HR 75

## 2020-06-27 DIAGNOSIS — M5416 Radiculopathy, lumbar region: Secondary | ICD-10-CM

## 2020-06-27 DIAGNOSIS — M961 Postlaminectomy syndrome, not elsewhere classified: Secondary | ICD-10-CM

## 2020-06-27 MED ORDER — METHYLPREDNISOLONE ACETATE 80 MG/ML IJ SUSP
80.0000 mg | Freq: Once | INTRAMUSCULAR | Status: AC
  Administered 2020-06-27: 80 mg

## 2020-06-27 NOTE — Progress Notes (Signed)
Pt state lower back pain that travels down his left leg to his ankle. Pt state walking and standing makes the pain worse. Pt state that he takes pain meds to help ease the pain. Pt has hx of inj on 04/30/20 pt state that the inj was great but didn't last long.  Numeric Pain Rating Scale and Functional Assessment Average Pain 10   In the last MONTH (on 0-10 scale) has pain interfered with the following?  1. General activity like being  able to carry out your everyday physical activities such as walking, climbing stairs, carrying groceries, or moving a chair?  Rating(10)   +Driver, -BT, -Dye Allergies.

## 2020-06-27 NOTE — Procedures (Signed)
Lumbosacral Transforaminal Epidural Steroid Injection - Sub-Pedicular Approach with Fluoroscopic Guidance  Patient: [name]      Date of Birth: Oct 17, 1940 MRN: [id number] PCP: Myrlene Broker, MD      Visit Date: 06/27/2020   Universal Protocol:    Date/Time: 06/27/2020  Consent Given By: the patient  Position: PRONE  Additional Comments: Vital signs were monitored before and after the procedure. Patient was prepped and draped in the usual sterile fashion. The correct patient, procedure, and site was verified.   Injection Procedure Details:   Procedure diagnoses: Lumbar radiculopathy [M54.16]    Meds Administered:  Meds ordered this encounter  Medications  . methylPREDNISolone acetate (DEPO-MEDROL) injection 80 mg    Laterality: Left  Location/Site:  L5-S1 S1-2  Needle:5.0 in., 22 ga.  Short bevel or Quincke spinal needle  Needle Placement: Transforaminal  Findings:    -Comments: Excellent flow of contrast along the nerve, nerve root and into the epidural space.  Procedure Details: After squaring off the end-plates to get a true AP view, the C-arm was positioned so that an oblique view of the foramen as noted above was visualized. The target area is just inferior to the "nose of the scotty dog" or sub pedicular. The soft tissues overlying this structure were infiltrated with 2-3 ml. of 1% Lidocaine without Epinephrine.  The spinal needle was inserted toward the target using a "trajectory" view along the fluoroscope beam.  Under AP and lateral visualization, the needle was advanced so it did not puncture dura and was located close the 6 O'Clock position of the pedical in AP tracterory. Biplanar projections were used to confirm position. Aspiration was confirmed to be negative for CSF and/or blood. A 1-2 ml. volume of Isovue-250 was injected and flow of contrast was noted at each level. Radiographs were obtained for documentation purposes.   After  attaining the desired flow of contrast documented above, a 0.5 to 1.0 ml test dose of 0.25% Marcaine was injected into each respective transforaminal space.  The patient was observed for 90 seconds post injection.  After no sensory deficits were reported, and normal lower extremity motor function was noted,   the above injectate was administered so that equal amounts of the injectate were placed at each foramen (level) into the transforaminal epidural space.   Additional Comments:  The patient tolerated the procedure well Dressing: 2 x 2 sterile gauze and Band-Aid    Post-procedure details: Patient was observed during the procedure. Post-procedure instructions were reviewed.  Patient left the clinic in stable condition.

## 2020-06-27 NOTE — Progress Notes (Signed)
[name] - 79 y.o. male MRN [id number]  Date of birth: 1940/11/16  Office Visit Note: Visit Date: 06/27/2020 PCP: Myrlene Broker, MD Referred by: Myrlene Broker, *  Subjective: Chief Complaint  Patient presents with  . Lower Back - Pain  . Left Thigh - Pain  . Left Ankle - Pain  . Left Leg - Pain   HPI:  [name] is a 79 y.o. male who comes in today  for planned Left L5 and S1  Lumbar epidural steroid injection with fluoroscopic guidance.  The patient has failed conservative care including home exercise, medications, time and activity modification.  This injection will be diagnostic and hopefully therapeutic.  Please see requesting physician notes for further details and justification.  MRI 2017. 10/10 left hip and leg pain in L5 and S1 distribution. If no help will need CT lumbar.   ROS Otherwise per HPI.  Assessment & Plan: Visit Diagnoses:    ICD-10-CM   1. Lumbar radiculopathy  M54.16 XR C-ARM NO REPORT    Epidural Steroid injection    methylPREDNISolone acetate (DEPO-MEDROL) injection 80 mg  2. Post laminectomy syndrome  M96.1 XR C-ARM NO REPORT    Epidural Steroid injection    methylPREDNISolone acetate (DEPO-MEDROL) injection 80 mg    Plan: No additional findings.   Meds & Orders:  Meds ordered this encounter  Medications  . methylPREDNISolone acetate (DEPO-MEDROL) injection 80 mg    Orders Placed This Encounter  Procedures  . XR C-ARM NO REPORT  . Epidural Steroid injection    Follow-up: Return if symptoms worsen or fail to improve.   Procedures: No procedures performed  Lumbosacral Transforaminal Epidural Steroid Injection - Sub-Pedicular Approach with Fluoroscopic Guidance  Patient: [name]      Date of Birth: October 22, 1940 MRN: [id number] PCP: Myrlene Broker, MD      Visit Date: 06/27/2020   Universal Protocol:    Date/Time: 06/27/2020  Consent Given By: the patient  Position: PRONE  Additional  Comments: Vital signs were monitored before and after the procedure. Patient was prepped and draped in the usual sterile fashion. The correct patient, procedure, and site was verified.   Injection Procedure Details:   Procedure diagnoses: Lumbar radiculopathy [M54.16]    Meds Administered:  Meds ordered this encounter  Medications  . methylPREDNISolone acetate (DEPO-MEDROL) injection 80 mg    Laterality: Left  Location/Site:  L5-S1 S1-2  Needle:5.0 in., 22 ga.  Short bevel or Quincke spinal needle  Needle Placement: Transforaminal  Findings:    -Comments: Excellent flow of contrast along the nerve, nerve root and into the epidural space.  Procedure Details: After squaring off the end-plates to get a true AP view, the C-arm was positioned so that an oblique view of the foramen as noted above was visualized. The target area is just inferior to the "nose of the scotty dog" or sub pedicular. The soft tissues overlying this structure were infiltrated with 2-3 ml. of 1% Lidocaine without Epinephrine.  The spinal needle was inserted toward the target using a "trajectory" view along the fluoroscope beam.  Under AP and lateral visualization, the needle was advanced so it did not puncture dura and was located close the 6 O'Clock position of the pedical in AP tracterory. Biplanar projections were used to confirm position. Aspiration was confirmed to be negative for CSF and/or blood. A 1-2 ml. volume of Isovue-250 was injected and flow of contrast was noted at each level. Radiographs were obtained  for documentation purposes.   After attaining the desired flow of contrast documented above, a 0.5 to 1.0 ml test dose of 0.25% Marcaine was injected into each respective transforaminal space.  The patient was observed for 90 seconds post injection.  After no sensory deficits were reported, and normal lower extremity motor function was noted,   the above injectate was administered so that equal  amounts of the injectate were placed at each foramen (level) into the transforaminal epidural space.   Additional Comments:  The patient tolerated the procedure well Dressing: 2 x 2 sterile gauze and Band-Aid    Post-procedure details: Patient was observed during the procedure. Post-procedure instructions were reviewed.  Patient left the clinic in stable condition.      Clinical History: MRI LUMBAR SPINE WITHOUT CONTRAST 12/29/2015  TECHNIQUE: Multiplanar, multisequence MR imaging of the lumbar spine was performed. No intravenous contrast was administered.  COMPARISON:  None.  FINDINGS: Segmentation: 5 non rib-bearing lumbar type vertebral bodies are present.  Alignment: Slight retrolisthesis is present at L2-3. There is straightening of the normal lumbar lordosis. AP alignment is otherwise anatomic.  Vertebrae: Minimal chronic endplate marrow changes are present at L4-5.  Conus medullaris: Extends to the T12 level and appears normal.  Paraspinal and other soft tissues: A 2.3 cm cyst is present at the lower pole of the left kidney. No other focal lesions are present. There is no significant adenopathy.  Disc levels:  T11-12: A shallow central disc protrusion is present. There is no significant stenosis.  T12-L1: A superior disc extrusion extends 15 mm above the inferior endplate of T12. This partially effaces the ventral CSF. The foramina are patent bilaterally.  L1-2: A leftward broad-based disc protrusion is present. Mild facet hypertrophy is noted. This results in mild left central and foraminal narrowing.  L2-3: A broad-based disc protrusion is present. Moderate facet hypertrophy is evident. Moderate subarticular narrowing is noted bilaterally. Mild foraminal narrowing is present bilaterally as well.  L3-4: A broad-based disc protrusion is present. Mild facet hypertrophy is noted. Moderate left and mild right subarticular narrowing is  present. Moderate left and mild right foraminal narrowing is present.  L4-5: A left laminectomy is noted. Residual moderate left and mild right subarticular narrowing is present. Moderate left and mild right foraminal stenosis is stable.  L5-S1: A prominent central disc protrusion is again noted. There is moderate subarticular and bilateral foraminal narrowing without significant interval change.  IMPRESSION: 1. 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 2. Superior disc extrusion at T12-L1 extends 15 mm above the inferior endplate of T12. 3. Mild left central and foraminal stenosis at L1-2. 4. Moderate subarticular and mild foraminal narrowing bilaterally at L2-3. 5. Moderate left and mild right subarticular and foraminal narrowing bilaterally at L3-4 and L4-5 is stable. 6. Moderate subarticular and bilateral foraminal stenosis at L5-S1 without significant interval change.     Objective:  VS:  HT:    WT:   BMI:     BP:(!) 147/78  HR:75bpm  TEMP: ( )  RESP:  Physical Exam Constitutional:      General: He is not in acute distress.    Appearance: Normal appearance. He is not ill-appearing.  HENT:     Head: Normocephalic and atraumatic.     Right Ear: External ear normal.     Left Ear: External ear normal.  Eyes:     Extraocular Movements: Extraocular movements intact.  Cardiovascular:     Rate and Rhythm: Normal rate.  Pulses: Normal pulses.  Abdominal:     General: There is no distension.     Palpations: Abdomen is soft.  Musculoskeletal:        General: No tenderness or signs of injury.     Right lower leg: No edema.     Left lower leg: No edema.     Comments: Patient has good distal strength without clonus.  Skin:    Findings: No erythema or rash.  Neurological:     General: No focal deficit present.     Mental Status: He is alert and oriented to person, place, and time.     Sensory: No sensory deficit.     Motor: No weakness or  abnormal muscle tone.     Coordination: Coordination normal.  Psychiatric:        Mood and Affect: Mood normal.        Behavior: Behavior normal.      Imaging: No results found.

## 2020-06-29 ENCOUNTER — Other Ambulatory Visit: Payer: Self-pay | Admitting: Physical Medicine and Rehabilitation

## 2020-06-29 ENCOUNTER — Telehealth: Payer: Self-pay

## 2020-06-29 ENCOUNTER — Encounter: Payer: Self-pay | Admitting: Internal Medicine

## 2020-06-29 ENCOUNTER — Ambulatory Visit (INDEPENDENT_AMBULATORY_CARE_PROVIDER_SITE_OTHER): Payer: Medicare PPO

## 2020-06-29 DIAGNOSIS — R55 Syncope and collapse: Secondary | ICD-10-CM | POA: Diagnosis not present

## 2020-06-29 LAB — CUP PACEART REMOTE DEVICE CHECK
Battery Remaining Longevity: 165 mo
Battery Voltage: 3.11 V
Brady Statistic AP VP Percent: 21.42 %
Brady Statistic AP VS Percent: 4.04 %
Brady Statistic AS VP Percent: 1.83 %
Brady Statistic AS VS Percent: 72.71 %
Brady Statistic RA Percent Paced: 25.36 %
Brady Statistic RV Percent Paced: 23.25 %
Date Time Interrogation Session: 20211203024557
Implantable Lead Implant Date: 20201204
Implantable Lead Implant Date: 20201204
Implantable Lead Location: 753859
Implantable Lead Location: 753860
Implantable Lead Model: 5076
Implantable Lead Model: 5076
Implantable Pulse Generator Implant Date: 20201204
Lead Channel Impedance Value: 361 Ohm
Lead Channel Impedance Value: 380 Ohm
Lead Channel Impedance Value: 418 Ohm
Lead Channel Impedance Value: 437 Ohm
Lead Channel Pacing Threshold Amplitude: 0.5 V
Lead Channel Pacing Threshold Amplitude: 0.5 V
Lead Channel Pacing Threshold Pulse Width: 0.4 ms
Lead Channel Pacing Threshold Pulse Width: 0.4 ms
Lead Channel Sensing Intrinsic Amplitude: 11.25 mV
Lead Channel Sensing Intrinsic Amplitude: 11.25 mV
Lead Channel Sensing Intrinsic Amplitude: 2.25 mV
Lead Channel Sensing Intrinsic Amplitude: 2.25 mV
Lead Channel Setting Pacing Amplitude: 1.5 V
Lead Channel Setting Pacing Amplitude: 2.5 V
Lead Channel Setting Pacing Pulse Width: 0.4 ms
Lead Channel Setting Sensing Sensitivity: 2 mV

## 2020-06-29 MED ORDER — TRAMADOL HCL 50 MG PO TABS
50.0000 mg | ORAL_TABLET | Freq: Three times a day (TID) | ORAL | 0 refills | Status: AC | PRN
Start: 2020-06-29 — End: 2020-07-04

## 2020-06-29 NOTE — Progress Notes (Signed)
Short term tramadol

## 2020-06-29 NOTE — Telephone Encounter (Signed)
Patient called he stated he received a injection in his spine he stated the injection didn't help. He is requesting pain medication to get him through the weekend unit he can be seen in the office. CB:[phone]

## 2020-06-29 NOTE — Telephone Encounter (Signed)
PLEASE inform him just I have told him on every injection the injection can take 2 to 3 days to start and u to a week to take full effect. I did rx tramadol. If he needs more he may need 1)PCP f/up 2)painmgt referral to [facility] or 3) lspine CT scan

## 2020-06-29 NOTE — Telephone Encounter (Signed)
Called pt and informed him the info.

## 2020-06-29 NOTE — Telephone Encounter (Signed)
Please advise 

## 2020-07-02 ENCOUNTER — Encounter: Payer: Self-pay | Admitting: Internal Medicine

## 2020-07-02 ENCOUNTER — Other Ambulatory Visit: Payer: Self-pay

## 2020-07-02 ENCOUNTER — Ambulatory Visit (INDEPENDENT_AMBULATORY_CARE_PROVIDER_SITE_OTHER): Payer: Medicare PPO | Admitting: Internal Medicine

## 2020-07-02 ENCOUNTER — Telehealth: Payer: Self-pay | Admitting: Cardiology

## 2020-07-02 VITALS — BP 140/80 | HR 69 | Temp 98.5°F | Ht 74.0 in | Wt 210.0 lb

## 2020-07-02 DIAGNOSIS — I739 Peripheral vascular disease, unspecified: Secondary | ICD-10-CM | POA: Diagnosis not present

## 2020-07-02 MED ORDER — METHYLPREDNISOLONE ACETATE 40 MG/ML IJ SUSP
40.0000 mg | Freq: Once | INTRAMUSCULAR | Status: AC
  Administered 2020-07-02: 40 mg via INTRAMUSCULAR

## 2020-07-02 NOTE — Patient Instructions (Signed)
We will check the ultrasound of the leg to check the blood flow.  We have given you an injection today to help with pain.

## 2020-07-02 NOTE — Progress Notes (Signed)
   Subjective:   Patient ID: [name], male    DOB: 27-Jun-1941, 79 y.o.   MRN: [id number]  HPI The patient is a 79 YO man coming in for ongoing back pain. Does radiate down into his let leg more. Has been ongoing for some time. Has gotten epidurals in the past which have helped. He did get an epidural about 1 week ago but this time it did not help. Would like to try alternatives to help. Is also concerned about the pain in the left leg as it is more with activity. He does get a pain in the calf area with walking pretty predictably.   Review of Systems  Constitutional: Negative.   HENT: Negative.   Eyes: Negative.   Respiratory: Positive for shortness of breath. Negative for cough and chest tightness.   Cardiovascular: Negative for chest pain, palpitations and leg swelling.  Gastrointestinal: Negative for abdominal distention, abdominal pain, constipation, diarrhea, nausea and vomiting.  Musculoskeletal: Positive for back pain, gait problem and myalgias.  Skin: Negative.   Psychiatric/Behavioral: Negative.     Objective:  Physical Exam Constitutional:      Appearance: He is well-developed.  HENT:     Head: Normocephalic and atraumatic.  Cardiovascular:     Rate and Rhythm: Normal rate and regular rhythm.  Pulmonary:     Effort: Pulmonary effort is normal. No respiratory distress.     Breath sounds: Normal breath sounds. No wheezing or rales.     Comments: On oxygen via nasal cannula, stable Abdominal:     General: Bowel sounds are normal. There is no distension.     Palpations: Abdomen is soft.     Tenderness: There is no abdominal tenderness. There is no rebound.  Musculoskeletal:        General: Tenderness present.     Cervical back: Normal range of motion.     Comments: Right leg normal temp, left leg significantly cooler than right, no palpable pulses in the foot region but palpable at the knee, no hair on either leg visible, capillary refill intact but sluggish on  the feet  Skin:    General: Skin is warm and dry.  Neurological:     Mental Status: He is alert and oriented to person, place, and time.     Coordination: Coordination normal.     Vitals:   07/02/20 1333  BP: 140/80  Pulse: 69  Temp: 98.5 F (36.9 C)  TempSrc: Oral  SpO2: 96%  Weight: 210 lb (95.3 kg)  Height: 6\' 2"  (1.88 m)    This visit occurred during the SARS-CoV-2 public health emergency.  Safety protocols were in place, including screening questions prior to the visit, additional usage of staff PPE, and extensive cleaning of exam room while observing appropriate contact time as indicated for disinfecting solutions.   Assessment & Plan:  Depo-medrol 40 mg IM given at visit

## 2020-07-02 NOTE — Telephone Encounter (Signed)
° ° ° °  Pt said his been feeling pain on his left leg, he said his been off of his eliquis and need concerned that he might have blood clot. He wanted to speak with a nurse to discuss

## 2020-07-02 NOTE — Telephone Encounter (Signed)
Spoke with patient. Patient having pain in his left leg from his buttocks to his calf. He usually gets an injection from [facility] and it settles the pain. He has had an injection and it has not helped the pain. He called the orthopaedic doctor who referred him to his primary. He has an appointment with his PCP at 1:40pm today but wanted to check with Dr. Jens Som about a potential clot. He reports he has been off Eliquis for awhile and is concerned. The area is not hot and streaky. He reports his left thigh is redder than his right and can be warmer at times. He also reports numbness at times when standing from his buttocks to his foot.   Advised patient to keep PCP appointment for today and will route to MD to see if any further recommendations.

## 2020-07-02 NOTE — Telephone Encounter (Signed)
Agree with primary care eval; if they feel DVT possible they can order lower ext dopplers [name]

## 2020-07-03 ENCOUNTER — Encounter: Payer: Self-pay | Admitting: Internal Medicine

## 2020-07-03 DIAGNOSIS — I739 Peripheral vascular disease, unspecified: Secondary | ICD-10-CM | POA: Insufficient documentation

## 2020-07-03 NOTE — Assessment & Plan Note (Signed)
Left leg with significant difference in temperature than right leg. Ordered US vascular arterial to assess. He has know CAD so PAD is likely. Given depo-medrol 40 mg IM to help with lumbar radiculopathy which is likely causing a component of the pain also. He is taking lipitor 40 mg daily. Given his ITP it is unclear if he would be a good candidate for cilostazol but if significant arterial disease would need assessment from vascular given pain on movement.

## 2020-07-08 NOTE — Progress Notes (Signed)
Patient Care Team: Myrlene Broker, MD as PCP - General (Internal Medicine) Ernesto Rutherford, MD as Consulting Physician (Ophthalmology) Kalman Shan, MD as Consulting Physician (Pulmonary Disease) System, Provider Not In as Consulting Physician Casimer Lanius, MD as Consulting Physician (Rheumatology) Oretha Milch, MD as Consulting Physician (Pulmonary Disease) Lewayne Bunting, MD as Consulting Physician (Cardiology)  DIAGNOSIS:    ICD-10-CM   1. 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 with above-mentioned history of ITPcurrently on treatment with prednisone 5 mg daily.He presents to the clinic todayfor follow-up. His major complaint today is severe sciatica pain that radiates down his left leg.  He sees orthopedics for this and previously received injections to his buttock area and had remarkable improvement.  This is happened twice and he is call their office but was unable to get through to them.  He is in a lot of pain and wanted [place] to help him out with pain medication.  He takes tramadol but it has not been helping.  He was given an injection by his primary care physician that lasted only for 1 to 2 days.  He is an appointment for imaging study coming up on 27 December.  He is also extremely constipated.  ALLERGIES:  has No Known Allergies.  MEDICATIONS:  Current Outpatient Medications  Medication Sig Dispense Refill  . albuterol (PROVENTIL) (2.5 MG/3ML) 0.083% nebulizer solution 1 vial in nebulizer every 6 hours as needed Dx 496 120 mL 6  . ALPRAZolam (XANAX) 0.5 MG tablet Take 0.5 mg by mouth at bedtime.     [name] atorvastatin (LIPITOR) 40 MG tablet Take 1 tablet (40 mg total) by mouth daily. 90 tablet 3  . Carboxymethylcellul-Glycerin (LUBRICATING EYE DROPS OP) Place 1 drop into both eyes 4 (four) times daily as needed (dry eyes).     .  Cholecalciferol (VITAMIN D3) 2000 units capsule Take 2,000 Units by mouth daily.     . empagliflozin (JARDIANCE) 10 MG TABS tablet Take 5 mg by mouth daily. Take 1/2 tablet daily (5 mg). Filled at [place].    . furosemide (LASIX) 40 MG tablet Take 1 tablet (40 mg total) by mouth daily. 90 tablet 3  . insulin glargine (LANTUS) 100 UNIT/ML injection Inject 60 Units into the skin at bedtime.     [name] levothyroxine (SYNTHROID) 150 MCG tablet Take 150 mcg by mouth daily before breakfast.    . losartan (COZAAR) 50 MG tablet Take 1 tablet (50 mg total) by mouth daily. 90 tablet 3  . metFORMIN (GLUCOPHAGE) 500 MG tablet Take 500 mg by mouth 2 (two) times daily with a meal. Take 2 in the am; 1 in the pm    . Multiple Vitamins-Minerals (MEGA MULTIVITAMIN FOR MEN PO) Take 1 tablet by mouth daily.     . Olodaterol HCl (STRIVERDI RESPIMAT) 2.5 MCG/ACT AERS Inhale 1 puff into the lungs daily.    [name] oxyCODONE-acetaminophen (PERCOCET/ROXICET) 5-325 MG tablet Take 1 tablet by mouth every 6 (six) hours as needed for severe pain. 60 tablet 0  . OXYGEN Inhale 2 L into the lungs continuous.    . predniSONE (DELTASONE) 10 MG tablet Take 0.5 tablets (5 mg total) by mouth daily with breakfast. 60 tablet 1  . Semaglutide (OZEMPIC, 1 MG/DOSE, [place]) Inject into the skin as directed.    . tamsulosin (FLOMAX) 0.4 MG CAPS capsule Take 1  capsule (0.4 mg total) by mouth at bedtime. 30 capsule 0  . tiotropium (SPIRIVA HANDIHALER) 18 MCG inhalation capsule Place 1 capsule (18 mcg total) into inhaler and inhale daily. 30 capsule 4   No current facility-administered medications for this visit.    PHYSICAL EXAMINATION: ECOG PERFORMANCE STATUS: 1 - Symptomatic but completely ambulatory  Vitals:   07/09/20 1115  BP: (!) 123/99  Pulse: 82  Resp: (!) 21  Temp: (!) 97.4 F (36.3 C)  SpO2: 96%   Filed Weights   07/09/20 1115  Weight: 206 lb 9.6 oz (93.7 kg)    LABORATORY DATA:  I have reviewed the data as listed CMP Latest Ref Rng  & Units 04/13/2020 02/22/2020 02/08/2020  Glucose 65 - 99 mg/dL 244(W) 102(V) 253(G)  BUN 7 - 25 mg/dL 22 64(Q) 20  Creatinine 0.70 - 1.18 mg/dL 0.34 7.42(V) 9.56  Sodium 135 - 146 mmol/L 142 136 139  Potassium 3.5 - 5.3 mmol/L 4.6 5.2 4.2  Chloride 98 - 110 mmol/L 103 97 103  CO2 20 - 32 mmol/L 31 25 28   Calcium 8.6 - 10.3 mg/dL 9.3 9.5 9.2  Total Protein 6.1 - 8.1 g/dL 6.2 - -  Total Bilirubin 0.2 - 1.2 mg/dL 0.5 - -  Alkaline Phos 38 - 126 U/L - - -  AST 10 - 35 U/L 19 - -  ALT 9 - 46 U/L 18 - -    Lab Results  Component Value Date   WBC 9.8 07/09/2020   HGB 12.0 (L) 07/09/2020   HCT 38.5 (L) 07/09/2020   MCV 83.9 07/09/2020   PLT 73 (L) 07/09/2020   NEUTROABS 7.9 (H) 07/09/2020    ASSESSMENT & PLAN:  Acute ITP (HCC) Relapsed acute ITP Prior treatment: Prednisone started 07/05/2019 completed 10/23/2019 Lab review: 02/08/2020:Platelet count 37 with blood with expectorationstarted on prednisone 02/15/2020:Platelet count 98 03/19/2020: Platelet count:68 04/03/2020: Platelet count 81 04/16/2020: Platelet count64(prednisone dose lowered to 5 mg) 05/14/2020: Platelet count 71, hemoglobin 11.5 06/11/2020: Platelet count 54 hemoglobin 11.5 06/25/2020: Platelets 38 07/09/2020: Platelets 73 (continue with 5 mg prednisone  Current dose of prednisone:5 mg   Severe sciatica pain: Patient unable to get through to orthopedics.  Because of his intractable pain I prescribed Percocets. He will follow with his orthopedic surgeon regarding the pain issues. I counseled him extensively about constipation as a side effect of narcotics and to use the pain medicines are very carefully and to take stool softeners regularly and MiraLAX as needed.  Return to clinic in 1 month with labs and follow-up   Orders Placed This Encounter  Procedures  . CBC with Differential (Cancer Center Only)    Standing Status:   Future    Standing Expiration Date:   07/09/2021   The patient has a good  understanding of the overall plan. he agrees with it. he will call with any problems that may develop before the next visit here.  Total time spent: 30 mins including face to face time and time spent for planning, charting and coordination of care  07/11/2021, MD 07/09/2020  I, 07/11/2020 [place], am acting as scribe for Dr. Kirt Boys.  I have reviewed the above documentation for accuracy and completeness, and I agree with the above.

## 2020-07-09 ENCOUNTER — Inpatient Hospital Stay: Payer: Medicare PPO | Admitting: Hematology and Oncology

## 2020-07-09 ENCOUNTER — Inpatient Hospital Stay: Payer: Medicare PPO | Attending: Hematology and Oncology

## 2020-07-09 ENCOUNTER — Other Ambulatory Visit: Payer: Self-pay

## 2020-07-09 DIAGNOSIS — M79605 Pain in left leg: Secondary | ICD-10-CM | POA: Diagnosis not present

## 2020-07-09 DIAGNOSIS — Z79899 Other long term (current) drug therapy: Secondary | ICD-10-CM | POA: Insufficient documentation

## 2020-07-09 DIAGNOSIS — D693 Immune thrombocytopenic purpura: Secondary | ICD-10-CM | POA: Diagnosis not present

## 2020-07-09 DIAGNOSIS — K5900 Constipation, unspecified: Secondary | ICD-10-CM | POA: Diagnosis not present

## 2020-07-09 LAB — CBC WITH DIFFERENTIAL (CANCER CENTER ONLY)
Abs Immature Granulocytes: 0.04 10*3/uL (ref 0.00–0.07)
Basophils Absolute: 0 10*3/uL (ref 0.0–0.1)
Basophils Relative: 0 %
Eosinophils Absolute: 0.1 10*3/uL (ref 0.0–0.5)
Eosinophils Relative: 1 %
HCT: 38.5 % — ABNORMAL LOW (ref 39.0–52.0)
Hemoglobin: 12 g/dL — ABNORMAL LOW (ref 13.0–17.0)
Immature Granulocytes: 0 %
Lymphocytes Relative: 11 %
Lymphs Abs: 1.1 10*3/uL (ref 0.7–4.0)
MCH: 26.1 pg (ref 26.0–34.0)
MCHC: 31.2 g/dL (ref 30.0–36.0)
MCV: 83.9 fL (ref 80.0–100.0)
Monocytes Absolute: 0.7 10*3/uL (ref 0.1–1.0)
Monocytes Relative: 7 %
Neutro Abs: 7.9 10*3/uL — ABNORMAL HIGH (ref 1.7–7.7)
Neutrophils Relative %: 81 %
Platelet Count: 73 10*3/uL — ABNORMAL LOW (ref 150–400)
RBC: 4.59 MIL/uL (ref 4.22–5.81)
RDW: 15 % (ref 11.5–15.5)
WBC Count: 9.8 10*3/uL (ref 4.0–10.5)
nRBC: 0 % (ref 0.0–0.2)

## 2020-07-09 MED ORDER — OXYCODONE-ACETAMINOPHEN 5-325 MG PO TABS: 1.0000 | ORAL_TABLET | Freq: Four times a day (QID) | ORAL | 0 refills | Status: DC | PRN

## 2020-07-09 MED ORDER — PREDNISONE 10 MG PO TABS: 5.0000 mg | ORAL_TABLET | Freq: Every day | ORAL | 1 refills | Status: DC

## 2020-07-09 MED ORDER — TRAMADOL HCL 50 MG PO TABS
50.0000 mg | ORAL_TABLET | Freq: Three times a day (TID) | ORAL | 0 refills | Status: DC | PRN
Start: 2020-07-09 — End: 2020-07-09

## 2020-07-09 NOTE — Assessment & Plan Note (Signed)
Relapsed acute ITP Prior treatment: Prednisone started 07/05/2019 completed 10/23/2019 Lab review: 02/08/2020:Platelet count 37 with blood with expectorationstarted on prednisone 02/15/2020:Platelet count 98 03/19/2020: Platelet count:68 04/03/2020: Platelet count 81 04/16/2020: Platelet count64(prednisone dose lowered to 5 mg) 05/14/2020: Platelet count 71, hemoglobin 11.5 06/11/2020: Platelet count 54 hemoglobin 11.5 06/25/2020: Platelets 38  Current dose of prednisone:5 mg I recommend giving him 2 doses of IVIG

## 2020-07-11 ENCOUNTER — Emergency Department (HOSPITAL_COMMUNITY)
Admission: EM | Admit: 2020-07-11 | Discharge: 2020-07-11 | Disposition: A | Discharge status: Home / Self Care | Payer: Medicare PPO | Attending: Emergency Medicine | Admitting: Emergency Medicine

## 2020-07-11 ENCOUNTER — Encounter (HOSPITAL_COMMUNITY): Payer: Self-pay

## 2020-07-11 ENCOUNTER — Other Ambulatory Visit: Payer: Self-pay

## 2020-07-11 DIAGNOSIS — Z950 Presence of cardiac pacemaker: Secondary | ICD-10-CM | POA: Insufficient documentation

## 2020-07-11 DIAGNOSIS — Z95818 Presence of other cardiac implants and grafts: Secondary | ICD-10-CM | POA: Diagnosis not present

## 2020-07-11 DIAGNOSIS — E039 Hypothyroidism, unspecified: Secondary | ICD-10-CM | POA: Diagnosis not present

## 2020-07-11 DIAGNOSIS — Z79899 Other long term (current) drug therapy: Secondary | ICD-10-CM | POA: Insufficient documentation

## 2020-07-11 DIAGNOSIS — Z87891 Personal history of nicotine dependence: Secondary | ICD-10-CM | POA: Diagnosis not present

## 2020-07-11 DIAGNOSIS — J449 Chronic obstructive pulmonary disease, unspecified: Secondary | ICD-10-CM | POA: Insufficient documentation

## 2020-07-11 DIAGNOSIS — E119 Type 2 diabetes mellitus without complications: Secondary | ICD-10-CM | POA: Diagnosis not present

## 2020-07-11 DIAGNOSIS — Z9981 Dependence on supplemental oxygen: Secondary | ICD-10-CM | POA: Diagnosis not present

## 2020-07-11 DIAGNOSIS — I2510 Atherosclerotic heart disease of native coronary artery without angina pectoris: Secondary | ICD-10-CM | POA: Insufficient documentation

## 2020-07-11 DIAGNOSIS — I110 Hypertensive heart disease with heart failure: Secondary | ICD-10-CM | POA: Diagnosis not present

## 2020-07-11 DIAGNOSIS — M79605 Pain in left leg: Secondary | ICD-10-CM | POA: Diagnosis not present

## 2020-07-11 DIAGNOSIS — Z794 Long term (current) use of insulin: Secondary | ICD-10-CM | POA: Insufficient documentation

## 2020-07-11 DIAGNOSIS — I5020 Unspecified systolic (congestive) heart failure: Secondary | ICD-10-CM | POA: Diagnosis not present

## 2020-07-11 DIAGNOSIS — Z95828 Presence of other vascular implants and grafts: Secondary | ICD-10-CM | POA: Diagnosis not present

## 2020-07-11 NOTE — ED Triage Notes (Addendum)
Pt presents with c/o left leg pain. Pt concerned for sciatica or a possible blood clot. Pt reports hx of a blood clot. Pt reports the pain goes from his buttocks to his left calf.

## 2020-07-11 NOTE — ED Notes (Signed)
Pt on 2L O2 [place], this is pt home O2 regimen.

## 2020-07-11 NOTE — ED Provider Notes (Signed)
[facility]-EMERGENCY DEPT Provider Note   CSN: [id number] Arrival date & time: 07/11/20  1412     History Chief Complaint  Patient presents with  . Leg Pain    [name] is a 79 y.o. male.  HPI      [name] is a 79 y.o. male, with a history of CHF, COPD, HTN, DM, presenting to the ED with left leg pain for about the last 3 weeks. He states his pain starts around the buttocks and extends distally.  The pain is intermittent, but does not seem to have a pattern to it.  He states his current pain feels similar to previous instances of his recurrent sciatica.  He presents today because his pain has been persisting longer than it has in the past.  He denies any pain during his time in the ED. He arrives inquiring about possible blood clot.  He adds he was removed from his Eliquis about a month ago due to GI bleeding.  Denies fever/chills, falls/trauma, abdominal pain, groin pain, chest pain, acute shortness of breath, lower extremity edema or acute color change, acute back pain, acute numbness, acute weakness, changes in bowel or bladder function, saddle anesthesias, or any other complaints.    Past Medical History:  Diagnosis Date  . 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 Anxiety   . Aortic aneurysm (HCC) 09/02/2017  . BENIGN PROSTATIC HYPERTROPHY 11/23/2009  . Cardiomyopathy (HCC) 08/28/2016  . Chronic systolic CHF (congestive heart failure) (HCC) 09/02/2017  . COPD (chronic obstructive pulmonary disease) (HCC)   . CORONARY ARTERY DISEASE 11/23/2009  . 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 DEGENERATIVE JOINT DISEASE 11/23/2009  . DEPRESSION 11/23/2009  . FATIGUE 11/23/2009  . GAIT DISTURBANCE 12/10/2009  . HEMOPTYSIS UNSPECIFIED 05/07/2010  . High cholesterol   . HYPERTENSION 07/30/2009  . HYPOTHYROIDISM 07/30/2009  . Ischemic cardiomyopathy 09/02/2017  . 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 On home oxygen therapy    "2-3L; 24/7"  (09/10/2016)  . OSA on CPAP   . PTSD (post-traumatic stress disorder) 03/10/2012  . 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 RA (rheumatoid arthritis) (HCC) 06/11/2011   "qwhere" (09/10/2016)  . RESPIRATORY FAILURE, CHRONIC 07/31/2009  . Scleritis of both eyes 03/17/2014  . Thrombocytopenia (HCC)   . 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 Type II diabetes mellitus [facility])     Patient Active Problem List   Diagnosis Date Noted  . Claudication (HCC) 07/03/2020  . Physical deconditioning 06/08/2020  . Hemoptysis 02/16/2020  . Syncope 10/04/2019  . LBBB (left bundle branch block) 10/04/2019  . Pacemaker 10/04/2019  . 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 Lung nodules 03/25/2019  . 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 Symptomatic anemia 01/20/2018  . Chronic respiratory failure with hypoxia (HCC) 12/01/2017  . Lung mass 12/01/2017  . PAF (paroxysmal atrial fibrillation) (HCC)   . Ischemic cardiomyopathy 09/02/2017  . Chronic systolic CHF (congestive heart failure) (HCC) 09/02/2017  . Aortic aneurysm (HCC) 09/02/2017  . Diabetes mellitus with complication, with long-term current use of insulin (HCC) 09/01/2017  . Anxiety and depression 09/01/2017  . COPD (chronic obstructive pulmonary disease) (HCC) 09/01/2017  . Pneumonia 12/26/2016  . Hyperlipidemia 01/05/2014  . OSA on CPAP 06/10/2011  . Coronary atherosclerosis 11/23/2009  . Hypothyroidism 07/30/2009  . Essential hypertension 07/30/2009    Past Surgical History:  Procedure Laterality Date  . 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 ABDOMINAL EXPLORATION SURGERY  02/2004   w/LOA/notes 12/10/2010; small bowel obstruction repair with adhesiolysis   . BACK SURGERY    . CARDIAC CATHETERIZATION     2 heart caths in the past.  One in 2000s showed one ulcerated plaque  Rx medically; Second at [facility] [name] 09/05/2016  . CATARACT EXTRACTION W/ INTRAOCULAR LENS  IMPLANT, BILATERAL Bilateral 2000s  . COLECTOMY     hx of remote ileum  resection due to bleeding  . COLONOSCOPY WITH PROPOFOL N/A 01/22/2018   Procedure: COLONOSCOPY WITH PROPOFOL;  Surgeon: Iva Boop, MD;  Location: [facility] END[facility];  Service: Endoscopy;  Laterality: N/A;  . CORONARY ANGIOPLASTY WITH STENT PLACEMENT  09/10/2016  . CORONARY STENT INTERVENTION N/A 09/10/2016   Procedure: Coronary Stent Intervention;  Surgeon: Kathleene Hazel, MD;  Location: [facility] INVASIVE CV LAB;  Service: Cardiovascular;  Laterality: N/A;  Distal RCA 4.0x16 Synergy  . ESOPHAGOGASTRODUODENOSCOPY (EGD) WITH PROPOFOL N/A 01/22/2018   Procedure: ESOPHAGOGASTRODUODENOSCOPY (EGD) WITH PROPOFOL;  Surgeon: Iva Boop, MD;  Location: [facility];  Service: Endoscopy;  Laterality: N/A;  . FEMORAL EMBOLOECTOMY Left 07/2000   with left leg ischemia; Dr. Hart Rochester, vascular  . GANGLION CYST EXCISION Right    "wrist"; Dr. Teressa Senter  . HOT HEMOSTASIS N/A 01/22/2018   Procedure: HOT HEMOSTASIS (ARGON PLASMA COAGULATION/BICAP);  Surgeon: Iva Boop, MD;  Location: [facility];  Service: Endoscopy;  Laterality: N/A;  . LOOP RECORDER INSERTION N/A 04/25/2019   Procedure: LOOP RECORDER INSERTION;  Surgeon: Duke Salvia, MD;  Location: [facility] INVASIVE CV LAB;  Service: Cardiovascular;  Laterality: N/A;  . LOOP RECORDER REMOVAL N/A 07/01/2019   Procedure: LOOP RECORDER REMOVAL;  Surgeon: Duke Salvia, MD;  Location: [facility] INVASIVE CV LAB;  Service: Cardiovascular;  Laterality: N/A;  . LUMBAR LAMINECTOMY  1972   Dr. Fannie Knee  . PACEMAKER IMPLANT N/A 07/01/2019   Procedure: PACEMAKER IMPLANT;  Surgeon: Duke Salvia, MD;  Location: [facility] INVASIVE CV LAB;  Service: Cardiovascular;  Laterality: N/A;  . RIGHT/LEFT HEART CATH AND CORONARY ANGIOGRAPHY N/A 09/10/2016   Procedure: Right/Left Heart Cath and Coronary Angiography;  Surgeon: Kathleene Hazel, MD;  Location: [facility] INVASIVE CV LAB;  Service: Cardiovascular;  Laterality: N/A;  . TONSILLECTOMY         Family History  Problem Relation Age of  Onset  . Other Mother        gun shot    Social History   Tobacco Use  . Smoking status: Former Smoker    Packs/day: 2.50    Years: 40.00    Pack years: 100.00    Types: Cigarettes, Pipe, Cigars    Quit date: 07/28/1998    Years since quitting: 21.9  . Smokeless tobacco: Never Used  Vaping Use  . Vaping Use: Never used  Substance Use Topics  . Alcohol use: No    Alcohol/week: 0.0 standard drinks  . Drug use: No    Home Medications Prior to Admission medications   Medication Sig Start Date End Date Taking? Authorizing Provider  albuterol (PROVENTIL) (2.5 MG/3ML) 0.083% nebulizer solution 1 vial in nebulizer every 6 hours as needed Dx 496 05/31/12   Kalman Shan, MD  ALPRAZolam Prudy Feeler) 0.5 MG tablet Take 0.5 mg by mouth at bedtime.     [provider]  atorvastatin (LIPITOR) 40 MG tablet Take 1 tablet (40 mg total) by mouth daily. 10/21/16   Pricilla Riffle, MD  Carboxymethylcellul-Glycerin (LUBRICATING EYE DROPS OP) Place 1 drop into both eyes 4 (four) times daily as  needed (dry eyes).     [provider]  Cholecalciferol (VITAMIN D3) 2000 units capsule Take 2,000 Units by mouth daily.  01/11/16   [provider]  empagliflozin (JARDIANCE) 10 MG TABS tablet Take 5 mg by mouth daily. Take 1/2 tablet daily (5 mg). Filled at [place].    [provider]  furosemide (LASIX) 40 MG tablet Take 1 tablet (40 mg total) by mouth daily. 02/08/20 05/30/20  Lewayne Bunting, MD  insulin glargine (LANTUS) 100 UNIT/ML injection Inject 60 Units into the skin at bedtime.     [provider]  levothyroxine (SYNTHROID) 150 MCG tablet Take 150 mcg by mouth daily before breakfast.    [provider]  losartan (COZAAR) 50 MG tablet Take 1 tablet (50 mg total) by mouth daily. 09/08/18   Lewayne Bunting, MD  metFORMIN (GLUCOPHAGE) 500 MG tablet Take 500 mg by mouth 2 (two) times daily with a meal. Take 2 in the am; 1 in the pm    [provider]   Multiple Vitamins-Minerals (MEGA MULTIVITAMIN FOR MEN PO) Take 1 tablet by mouth daily.     [provider]  Olodaterol HCl (STRIVERDI RESPIMAT) 2.5 MCG/ACT AERS Inhale 1 puff into the lungs daily. 04/08/18   Parrett, Virgel Bouquet, NP  oxyCODONE-acetaminophen (PERCOCET/ROXICET) 5-325 MG tablet Take 1 tablet by mouth every 6 (six) hours as needed for severe pain. 07/09/20   Serena Croissant, MD  OXYGEN Inhale 2 L into the lungs continuous.    [provider]  predniSONE (DELTASONE) 10 MG tablet Take 0.5 tablets (5 mg total) by mouth daily with breakfast. 07/09/20   Serena Croissant, MD  Semaglutide (OZEMPIC, 1 MG/DOSE, [place]) Inject into the skin as directed.    [provider]  tamsulosin (FLOMAX) 0.4 MG CAPS capsule Take 1 capsule (0.4 mg total) by mouth at bedtime. 09/14/17   Sharlene Dory, DO  tiotropium (SPIRIVA HANDIHALER) 18 MCG inhalation capsule Place 1 capsule (18 mcg total) into inhaler and inhale daily. 07/16/17   Parrett, Virgel Bouquet, NP    Allergies    Patient has no known allergies.  Review of Systems   Review of Systems  Constitutional: Negative for chills and fever.  Respiratory: Negative for cough and shortness of breath.   Cardiovascular: Negative for chest pain and leg swelling.  Gastrointestinal: Negative for abdominal pain, nausea and vomiting.  Genitourinary: Negative for difficulty urinating.  Musculoskeletal: Positive for arthralgias. Negative for back pain (none acute).  Neurological: Negative for weakness and numbness.  All other systems reviewed and are negative.   Physical Exam Updated Vital Signs BP 129/89 (BP Location: Right Arm)   Pulse (!) 104   Temp 98.2 F (36.8 C) (Oral)   Resp 18   SpO2 99%   Physical Exam Vitals and nursing note reviewed.  Constitutional:      General: He is not in acute distress.    Appearance: He is well-developed. He is not diaphoretic.  HENT:     Head: Normocephalic and atraumatic.     Mouth/Throat:      Mouth: Mucous membranes are moist.     Pharynx: Oropharynx is clear.  Eyes:     Conjunctiva/sclera: Conjunctivae normal.  Cardiovascular:     Rate and Rhythm: Normal rate and regular rhythm.     Pulses: Normal pulses.          Radial pulses are 2+ on the right side and 2+ on the left side.  Posterior tibial pulses are 2+ on the right side and 2+ on the left side.     Comments: Tactile temperature in the extremities appropriate and equal bilaterally. Patient shows evidence of venous insufficiency with varicose veins throughout the lower extremities. Pulmonary:     Effort: Pulmonary effort is normal. No respiratory distress.  Abdominal:     Palpations: Abdomen is soft.     Tenderness: There is no abdominal tenderness. There is no guarding.  Musculoskeletal:     Cervical back: Neck supple.     Right lower leg: No edema.     Left lower leg: No edema.     Comments: No tenderness, abnormal coloration when compared to the right leg, swelling, or pain with range of motion in the joints of the left lower extremity.  Skin:    General: Skin is warm and dry.  Neurological:     Mental Status: He is alert.     Comments: Sensation light touch grossly intact in the left lower extremity. Strength 5/5 in the left lower extremity.  Psychiatric:        Mood and Affect: Mood and affect normal.        Speech: Speech normal.        Behavior: Behavior normal.     ED Results / Procedures / Treatments   Labs (all labs ordered are listed, but only abnormal results are displayed) Labs Reviewed - No data to display  EKG None  Radiology No results found.  Procedures Procedures (including critical care time)  Medications Ordered in ED Medications - No data to display  ED Course  I have reviewed the triage vital signs and the nursing notes.  Pertinent labs & imaging results that were available during my care of the patient were reviewed by me and considered in my medical decision  making (see chart for details).  Clinical Course as of 07/11/20 2354  Wed Jul 11, 2020  1746 Patient stating he doesn't want any [place] [SJ]    Clinical Course User Index [SJ] Saryn Cherry C, PA-C   MDM Rules/Calculators/A&P                          Patient presents with intermittent left leg pain for the past 3 weeks. No evidence of neurologic compromise.  Ultrasound for further evaluation of vascularity was recommended, but ultimately patient declined.  I placed an order for an outpatient ultrasound should patient change his mind. Return precautions discussed.  Patient voiced understanding.  Findings and plan of care discussed with attending physician, Sherin Quarry, MD. Dr. Audley Hose personally evaluated and examined this patient.  Final Clinical Impression(s) / ED Diagnoses Final diagnoses:  Left leg pain    Rx / DC Orders ED Discharge Orders         Ordered    LE VENOUS        07/11/20 1749           Anselm Pancoast, PA-C 07/12/20 0000    Cheryll Cockayne, MD 07/12/20 0003

## 2020-07-11 NOTE — Discharge Instructions (Addendum)
Please follow-up with your primary care provider and/or vascular specialist. An ultrasound of the leg was suggested to evaluate for blood clot, but was declined.  Should you change your mind, please feel free to return during the daytime. An order has been placed for an outpatient ultrasound should you choose to have this performed. You should also return to the emergency department should you begin to have increasing/constant pain, abdominal pain, shortness of breath, chest pain, numbness, weakness, or any other major concerns.

## 2020-07-11 NOTE — Progress Notes (Signed)
Remote pacemaker transmission.   

## 2020-07-11 NOTE — ED Notes (Signed)
An After Visit Summary was printed and given to the patient. Discharge instructions given and no further questions at this time.  Pt has his home O2 and is currently using it as he ambulates out of ED.

## 2020-07-12 ENCOUNTER — Other Ambulatory Visit: Payer: Self-pay | Admitting: *Deleted

## 2020-07-12 ENCOUNTER — Ambulatory Visit (HOSPITAL_COMMUNITY): Admission: RE | Admit: 2020-07-12 | Payer: Medicare PPO | Source: Ambulatory Visit

## 2020-07-12 MED ORDER — PREDNISONE 5 MG PO TABS: 5.0000 mg | ORAL_TABLET | Freq: Every day | ORAL | 0 refills | Status: DC

## 2020-07-16 DIAGNOSIS — J449 Chronic obstructive pulmonary disease, unspecified: Secondary | ICD-10-CM | POA: Diagnosis not present

## 2020-07-21 DIAGNOSIS — J8410 Pulmonary fibrosis, unspecified: Secondary | ICD-10-CM | POA: Diagnosis not present

## 2020-07-21 DIAGNOSIS — G4733 Obstructive sleep apnea (adult) (pediatric): Secondary | ICD-10-CM | POA: Diagnosis not present

## 2020-07-21 DIAGNOSIS — J449 Chronic obstructive pulmonary disease, unspecified: Secondary | ICD-10-CM | POA: Diagnosis not present

## 2020-07-21 DIAGNOSIS — E860 Dehydration: Secondary | ICD-10-CM | POA: Diagnosis not present

## 2020-07-23 ENCOUNTER — Ambulatory Visit (HOSPITAL_COMMUNITY)
Admission: RE | Admit: 2020-07-23 | Discharge: 2020-07-23 | Disposition: A | Discharge status: Home / Self Care | Payer: Medicare PPO | Source: Ambulatory Visit | Attending: Cardiovascular Disease | Admitting: Cardiovascular Disease

## 2020-07-23 ENCOUNTER — Other Ambulatory Visit (HOSPITAL_COMMUNITY): Payer: Self-pay | Admitting: Internal Medicine

## 2020-07-23 ENCOUNTER — Inpatient Hospital Stay: Payer: Medicare PPO

## 2020-07-23 ENCOUNTER — Telehealth: Payer: Self-pay

## 2020-07-23 ENCOUNTER — Telehealth: Payer: Self-pay | Admitting: *Deleted

## 2020-07-23 ENCOUNTER — Other Ambulatory Visit: Payer: Self-pay | Admitting: Internal Medicine

## 2020-07-23 ENCOUNTER — Telehealth: Payer: Self-pay | Admitting: Cardiology

## 2020-07-23 ENCOUNTER — Other Ambulatory Visit: Payer: Self-pay

## 2020-07-23 DIAGNOSIS — I739 Peripheral vascular disease, unspecified: Secondary | ICD-10-CM | POA: Diagnosis not present

## 2020-07-23 MED ORDER — OXYCODONE-ACETAMINOPHEN 5-325 MG PO TABS: 1.0000 | ORAL_TABLET | Freq: Four times a day (QID) | ORAL | 0 refills | Status: DC | PRN

## 2020-07-23 NOTE — Telephone Encounter (Signed)
LVM instructing pt that medication has been sent to [facility] on [address]; may call tomorrow if ques/concerns.

## 2020-07-23 NOTE — Telephone Encounter (Signed)
Abnormal ABI on left leg .58 (.95 or higher is normal).  Indicates that u/s needs to be done to determine why low; will be referred to specialists in doctor's office to determine need for angiogram.   Order was put in for the f/u u/s, but needs PCP signature; Needs to be done within 2 weeks.  Tech states message has also been sent directly to PCP.  Pt also requests pain meds b/c he's in pain. Oxycodone prescribed on 07/09/20 by Gudena.  Will inform pt that additional meds (if applicable) will need to come from that provider (oncologist).  LVM for pt to refill pain meds with oncologist.

## 2020-07-23 NOTE — Telephone Encounter (Signed)
Adelina Mings states that the patient was seen 2 weeks ago & was prescribed 2week supply of oxycodone for severe sciatica pain to "buy time" until he was seen by PCP. Per Dr Pamelia Hoit DOS 07/09/20: "Severe sciatica pain: Patient unable to get through to orthopedics.  Because of his intractable pain I prescribed Percocets. He will follow with his orthopedic surgeon regarding the pain issues."  Pt now diagnosed with blood clot & is in significant pain.  Dr Okey Dupre has never prescribed oxycodone to patient.  Per controlled substance database: pregabalin refilled on 07/17/20 prescribed by Ghandi 02/28/20 Oxycodone filled on 07/09/20 prescribed by  Last OV: 07/02/20 Next OV: no future OV  Since PCP is out of office, will send pt request for pain meds to a current in-office provider.

## 2020-07-23 NOTE — Telephone Encounter (Signed)
Left message for Dr. Earmon Phoenix nurse Adelina Mings to call back.

## 2020-07-23 NOTE — Telephone Encounter (Signed)
[name] returning call.

## 2020-07-23 NOTE — Telephone Encounter (Signed)
Received call from pt stating he was diagnosed  with a DVT of requesting refill of oxycodone 5-325.  Per MD pt was prescribed medication to help alleviate pain until pt was able to be seen by orthopedics or PCP for further evaluation and treatment.  MD states PCP is currently managing DVT and needs to follow up and treat pts pain. RN placed call Dr. Frutoso Chase office and alerted Ebony Hail, RN who verbalized understanding.

## 2020-07-23 NOTE — Telephone Encounter (Signed)
Kelsey from the Cancer center called in and stated pt has a DVT in leg and it is painful. They had given him 2 week of oxycodone for the pain but pt states he is still in pain.  She called PCP to see if they would send him in any pain meds.  They declined and so she called to see If Dr Jens Som could call in a short supply?    Best number for her [phone]

## 2020-07-23 NOTE — Telephone Encounter (Signed)
Spoke with Adelina Mings from Dr. Earmon Phoenix office and advised that Dr. Jens Som is unable to prescribe pain medication for this patient. Pt should be referred back to his PCP, who is managing his workup for potential DVT. Kelsey verbalizes understanding.

## 2020-07-26 DIAGNOSIS — J449 Chronic obstructive pulmonary disease, unspecified: Secondary | ICD-10-CM | POA: Diagnosis not present

## 2020-07-26 DIAGNOSIS — J8410 Pulmonary fibrosis, unspecified: Secondary | ICD-10-CM | POA: Diagnosis not present

## 2020-07-26 DIAGNOSIS — E860 Dehydration: Secondary | ICD-10-CM | POA: Diagnosis not present

## 2020-07-26 DIAGNOSIS — G4733 Obstructive sleep apnea (adult) (pediatric): Secondary | ICD-10-CM | POA: Diagnosis not present

## 2020-07-31 ENCOUNTER — Ambulatory Visit (HOSPITAL_COMMUNITY)
Admission: RE | Admit: 2020-07-31 | Discharge: 2020-07-31 | Disposition: A | Discharge status: Home / Self Care | Payer: Medicare PPO | Source: Ambulatory Visit | Attending: Cardiovascular Disease | Admitting: Cardiovascular Disease

## 2020-07-31 ENCOUNTER — Other Ambulatory Visit: Payer: Self-pay

## 2020-07-31 DIAGNOSIS — I739 Peripheral vascular disease, unspecified: Secondary | ICD-10-CM | POA: Insufficient documentation

## 2020-08-02 ENCOUNTER — Telehealth: Payer: Self-pay | Admitting: Internal Medicine

## 2020-08-02 NOTE — Telephone Encounter (Signed)
Patient calling to discuss doppler results Patient wants to know if he will be referred to a specialist

## 2020-08-03 ENCOUNTER — Telehealth: Payer: Self-pay | Admitting: Cardiology

## 2020-08-03 ENCOUNTER — Other Ambulatory Visit: Payer: Self-pay | Admitting: Internal Medicine

## 2020-08-03 DIAGNOSIS — I739 Peripheral vascular disease, unspecified: Secondary | ICD-10-CM

## 2020-08-03 NOTE — Telephone Encounter (Signed)
Spoke with patient about vascular testing that was ordered by PCP. Per report and his understanding, he needs a vascular consult. He was not gotten a formal result note from PCP yet. He reports sciatica-type pain, states another provider gave him pain med, and PCP will not refill. Explained that I will send a message to Dr. Jens Som to see if he can review the report, schedule a tentative PV consult with Dr. Allyson Sabal on Jan 11 but explained he does interventions and does not prescribe pain meds. He agreed w/plan. Asked for call back if vascular consult was not advised by Dr. Jens Som, as to cancel visit

## 2020-08-03 NOTE — Telephone Encounter (Signed)
Result note done, referral to vascular done.

## 2020-08-03 NOTE — Telephone Encounter (Signed)
[name] is calling stating the recent test he had performed at our offie per Dr. Okey Dupre determined he will be needing a Vascular Doctor for the aneurysm in his leg. He is requesting a referral to Dr. Allyson Sabal to be seen for this. He states he has been trying to get in contact with Dr. Okey Dupre in regards to this, but has been unsuccessful. Please advise.

## 2020-08-05 NOTE — Progress Notes (Signed)
Patient Care Team: Myrlene Broker, MD as PCP - General (Internal Medicine) Ernesto Rutherford, MD as Consulting Physician (Ophthalmology) Kalman Shan, MD as Consulting Physician (Pulmonary Disease) System, Provider Not In as Consulting Physician Casimer Lanius, MD as Consulting Physician (Rheumatology) Oretha Milch, MD as Consulting Physician (Pulmonary Disease) Lewayne Bunting, MD as Consulting Physician (Cardiology)  DIAGNOSIS:    ICD-10-CM   1. 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 with above-mentioned history of ITPcurrently on treatment with prednisone5 mg daily.He presents to the clinic todayfor follow-up.  ALLERGIES:  has No Known Allergies.  MEDICATIONS:  Current Outpatient Medications  Medication Sig Dispense Refill  . albuterol (PROVENTIL) (2.5 MG/3ML) 0.083% nebulizer solution 1 vial in nebulizer every 6 hours as needed Dx 496 120 mL 6  . ALPRAZolam (XANAX) 0.5 MG tablet Take 0.5 mg by mouth at bedtime.     [name] atorvastatin (LIPITOR) 40 MG tablet Take 1 tablet (40 mg total) by mouth daily. 90 tablet 3  . Carboxymethylcellul-Glycerin (LUBRICATING EYE DROPS OP) Place 1 drop into both eyes 4 (four) times daily as needed (dry eyes).     . Cholecalciferol (VITAMIN D3) 2000 units capsule Take 2,000 Units by mouth daily.     . empagliflozin (JARDIANCE) 10 MG TABS tablet Take 5 mg by mouth daily. Take 1/2 tablet daily (5 mg). Filled at [place].    . furosemide (LASIX) 40 MG tablet Take 1 tablet (40 mg total) by mouth daily. 90 tablet 3  . insulin glargine (LANTUS) 100 UNIT/ML injection Inject 60 Units into the skin at bedtime.     [name] levothyroxine (SYNTHROID) 150 MCG tablet Take 150 mcg by mouth daily before breakfast.    . losartan (COZAAR) 50 MG tablet Take 1 tablet (50 mg total) by mouth daily. 90 tablet 3  . metFORMIN (GLUCOPHAGE) 500 MG tablet Take 500 mg by mouth 2 (two) times daily  with a meal. Take 2 in the am; 1 in the pm    . Multiple Vitamins-Minerals (MEGA MULTIVITAMIN FOR MEN PO) Take 1 tablet by mouth daily.     . Olodaterol HCl (STRIVERDI RESPIMAT) 2.5 MCG/ACT AERS Inhale 1 puff into the lungs daily.    [name] oxyCODONE-acetaminophen (PERCOCET/ROXICET) 5-325 MG tablet Take 1 tablet by mouth every 6 (six) hours as needed for severe pain. 60 tablet 0  . OXYGEN Inhale 2 L into the lungs continuous.    . predniSONE (DELTASONE) 5 MG tablet Take 1 tablet (5 mg total) by mouth daily with breakfast. 30 tablet 0  . Semaglutide (OZEMPIC, 1 MG/DOSE, [place]) Inject into the skin as directed.    . tamsulosin (FLOMAX) 0.4 MG CAPS capsule Take 1 capsule (0.4 mg total) by mouth at bedtime. 30 capsule 0  . tiotropium (SPIRIVA HANDIHALER) 18 MCG inhalation capsule Place 1 capsule (18 mcg total) into inhaler and inhale daily. 30 capsule 4   No current facility-administered medications for this visit.    PHYSICAL EXAMINATION: ECOG PERFORMANCE STATUS: 1 - Symptomatic but completely ambulatory  Vitals:   08/06/20 1044  BP: (!) 152/85  Pulse: 60  Resp: 18  Temp: 97.9 F (36.6 C)  SpO2: 97%   Filed Weights   08/06/20 1044  Weight: 216 lb 4.8 oz (98.1 kg)    LABORATORY DATA:  I have reviewed the data as listed CMP Latest Ref Rng & Units 04/13/2020 02/22/2020 02/08/2020  Glucose 65 -  99 mg/dL 376(E) 831(D) 176(H)  BUN 7 - 25 mg/dL 22 60(V) 20  Creatinine 0.70 - 1.18 mg/dL 3.71 0.62(I) 9.48  Sodium 135 - 146 mmol/L 142 136 139  Potassium 3.5 - 5.3 mmol/L 4.6 5.2 4.2  Chloride 98 - 110 mmol/L 103 97 103  CO2 20 - 32 mmol/L 31 25 28   Calcium 8.6 - 10.3 mg/dL 9.3 9.5 9.2  Total Protein 6.1 - 8.1 g/dL 6.2 - -  Total Bilirubin 0.2 - 1.2 mg/dL 0.5 - -  Alkaline Phos 38 - 126 U/L - - -  AST 10 - 35 U/L 19 - -  ALT 9 - 46 U/L 18 - -    Lab Results  Component Value Date   WBC 8.0 08/06/2020   HGB 11.8 (L) 08/06/2020   HCT 38.6 (L) 08/06/2020   MCV 85.0 08/06/2020   PLT 73 (L)  08/06/2020   NEUTROABS 6.0 08/06/2020    ASSESSMENT & PLAN:  Acute ITP (HCC) Relapsed acute ITP Prior treatment: Prednisone started 07/05/2019 completed 10/23/2019 Lab review: 02/08/2020:Platelet count 37 with blood with expectorationstarted on prednisone 02/15/2020:Platelet count 98 03/19/2020: Platelet count:68 04/16/2020: Platelet count64(prednisone dose lowered to 5 mg) 06/25/2020: Platelets 38 07/09/2020: Platelets 73 (continue with 5 mg prednisone) 08/06/2020: Platelets  Current dose of prednisone:5 mg If in 2 months his platelets remain above 50 then we will start tapering his prednisone.  He will then take prednisone every other day.   Severe left leg pain: Ultrasound of the left lower extremity revealed occlusion of the popliteal vein and significant blockages along his arterial system.  He has an appointment to see Dr. 10/04/2020 tomorrow.  I suspect that he might need a vascular surgery consultation. He is relying on Percocets for pain relief.  He tells me that he is almost close to running out.  He has been calling his primary care physician for his refills.  Return to clinic in 1 month with labs and follow-up    No orders of the defined types were placed in this encounter.  The patient has a good understanding of the overall plan. he agrees with it. he will call with any problems that may develop before the next visit here.  Total time spent: 20 mins including face to face time and time spent for planning, charting and coordination of care  Gery Pray, MD 08/06/2020  I, 10/04/2020 [place], am acting as scribe for Dr. Kirt Boys.  I have reviewed the above documentation for accuracy and completeness, and I agree with the above.

## 2020-08-06 ENCOUNTER — Other Ambulatory Visit: Payer: Self-pay

## 2020-08-06 ENCOUNTER — Inpatient Hospital Stay: Payer: Medicare PPO | Admitting: Hematology and Oncology

## 2020-08-06 ENCOUNTER — Inpatient Hospital Stay: Payer: Medicare PPO | Attending: Hematology and Oncology

## 2020-08-06 DIAGNOSIS — Z79899 Other long term (current) drug therapy: Secondary | ICD-10-CM | POA: Insufficient documentation

## 2020-08-06 DIAGNOSIS — I708 Atherosclerosis of other arteries: Secondary | ICD-10-CM | POA: Diagnosis not present

## 2020-08-06 DIAGNOSIS — D693 Immune thrombocytopenic purpura: Secondary | ICD-10-CM | POA: Diagnosis not present

## 2020-08-06 LAB — CBC WITH DIFFERENTIAL (CANCER CENTER ONLY)
Abs Immature Granulocytes: 0.03 10*3/uL (ref 0.00–0.07)
Basophils Absolute: 0 10*3/uL (ref 0.0–0.1)
Basophils Relative: 1 %
Eosinophils Absolute: 0.1 10*3/uL (ref 0.0–0.5)
Eosinophils Relative: 1 %
HCT: 38.6 % — ABNORMAL LOW (ref 39.0–52.0)
Hemoglobin: 11.8 g/dL — ABNORMAL LOW (ref 13.0–17.0)
Immature Granulocytes: 0 %
Lymphocytes Relative: 15 %
Lymphs Abs: 1.2 10*3/uL (ref 0.7–4.0)
MCH: 26 pg (ref 26.0–34.0)
MCHC: 30.6 g/dL (ref 30.0–36.0)
MCV: 85 fL (ref 80.0–100.0)
Monocytes Absolute: 0.7 10*3/uL (ref 0.1–1.0)
Monocytes Relative: 8 %
Neutro Abs: 6 10*3/uL (ref 1.7–7.7)
Neutrophils Relative %: 75 %
Platelet Count: 73 10*3/uL — ABNORMAL LOW (ref 150–400)
RBC: 4.54 MIL/uL (ref 4.22–5.81)
RDW: 15.2 % (ref 11.5–15.5)
WBC Count: 8 10*3/uL (ref 4.0–10.5)
nRBC: 0 % (ref 0.0–0.2)

## 2020-08-06 MED ORDER — PREDNISONE 5 MG PO TABS: 5.0000 mg | ORAL_TABLET | Freq: Every day | ORAL | 3 refills | Status: DC

## 2020-08-06 NOTE — Telephone Encounter (Signed)
Spoke with pt, aware of appointment date and time with dr berry.

## 2020-08-06 NOTE — Assessment & Plan Note (Signed)
Relapsed acute ITP Prior treatment: Prednisone started 07/05/2019 completed 10/23/2019 Lab review: 02/08/2020:Platelet count 37 with blood with expectorationstarted on prednisone 02/15/2020:Platelet count 98 03/19/2020: Platelet count:68 04/16/2020: Platelet count64(prednisone dose lowered to 5 mg) 06/25/2020: Platelets 38 07/09/2020: Platelets 73 (continue with 5 mg prednisone) 08/06/2020: Platelets  Current dose of prednisone:5 mg   Severe sciatica pain: Patient unable to get through to orthopedics.  Because of his intractable pain I prescribed Percocets. He will follow with his orthopedic surgeon regarding the pain issues.  Return to clinic in 1 month for labs and follow-up I counseled him extensively about constipation as a side effect of narcotics and to use the pain medicines are very carefully and to take stool softeners regularly and MiraLAX as needed.  Return to clinic in 1 month with labs and follow-up

## 2020-08-06 NOTE — Telephone Encounter (Signed)
Ok for consult with Dr Lavenia Atlas

## 2020-08-07 ENCOUNTER — Telehealth: Payer: Self-pay | Admitting: *Deleted

## 2020-08-07 ENCOUNTER — Other Ambulatory Visit: Payer: Self-pay | Admitting: *Deleted

## 2020-08-07 ENCOUNTER — Ambulatory Visit: Payer: Medicare PPO | Admitting: Cardiovascular Disease

## 2020-08-07 ENCOUNTER — Encounter: Payer: Self-pay | Admitting: Cardiovascular Disease

## 2020-08-07 DIAGNOSIS — I739 Peripheral vascular disease, unspecified: Secondary | ICD-10-CM

## 2020-08-07 NOTE — Telephone Encounter (Signed)
Pt informed of below.    There is a blockage in the left leg arteries and we have placed a referral to a vascular specialist.

## 2020-08-07 NOTE — Patient Instructions (Signed)
Medication Instructions:  Your physician recommends that you continue on your current medications as directed. Please refer to the Current Medication list given to you today.  *If you need a refill on your cardiac medications before your next appointment, please call your pharmacy*   Follow-Up: At [facility], you and your health needs are our priority.  As part of our continuing mission to provide you with exceptional heart care, we have created designated Provider Care Teams.  These Care Teams include your primary Cardiologist (physician) and Advanced Practice Providers (APPs -  Physician Assistants and Nurse Practitioners) who all work together to provide you with the care you need, when you need it.  We recommend signing up for the patient portal called "MyChart".  Sign up information is provided on this After Visit Summary.  MyChart is used to connect with patients for Virtual Visits (Telemedicine).  Patients are able to view lab/test results, encounter notes, upcoming appointments, etc.  Non-urgent messages can be sent to your provider as well.   To learn more about what you can do with MyChart, go to [email or web address].    Your next appointment:   You may make an appointment to see [place] as needed. Please call our office to make an appointment.  Provider:   Nanetta Batty, MD

## 2020-08-07 NOTE — Assessment & Plan Note (Signed)
[name] was referred to me by [name], his PCP for evaluation of his lifestyle limiting claudication.  He is a cardiology patient of [name].  He had femoral endarterectomy/embolectomy by [name] January 2002.  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  He has had left calf claudication for the last 4 to 6 weeks which is new.  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  I believe he needs angiography and fem-tib bypass grafting.  Rather than me perform the angiography and can refer him to [name] for more definitive diagnosis and treatment.  I will see him back as needed.

## 2020-08-07 NOTE — Telephone Encounter (Signed)
Patient aware of his appt with Dr. Chestine Spore on 08/04/20. He is requesting a small supply of Oxy/APAP to get him through that appointment. He states he will run out of his medication on 07/31/20.

## 2020-08-07 NOTE — Progress Notes (Signed)
08/07/2020 [name]   05-04-1941  [id number]  Primary Physician Myrlene Broker, MD Primary Cardiologist: Runell Gess MD Milagros Loll, [place], [place]  HPI:  [name] is a 80 y.o. 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  He is referred by his PCP, Dr. Okey Dupre, for evaluation of new onset left calf claudication.  His cardiologist is Dr. Jens Som.  He does have a history of remote tobacco abuse having quit over 20 years ago with oxygen dependent COPD.  He has treated hypertension, diabetes and hyperlipidemia.  He is never had a heart attack or stroke.  There is no family history of heart disease.  He has had RCA drug-eluting stenting by Dr. Clifton James 10/27/2016 as well as permanent transvenous pacemaker insertion by Dr. Graciela Husbands who follows him for this.  He was on apixaban in the past which has been discontinued.  He developed left calf claudication approximate 4 to 6 weeks ago which is fairly rapid in onset.  Lower extremity arterial Doppler studies performed 07/31/2020 revealed a normal right ABI with a left ABI of 0.58 and what appears to be an occluded left popliteal artery aneurysm with thrombosis of the popliteal artery.   Current Meds  Medication Sig  . albuterol (PROVENTIL) (2.5 MG/3ML) 0.083% nebulizer solution 1 vial in nebulizer every 6 hours as needed Dx 496  . ALPRAZolam (XANAX) 0.5 MG tablet Take 0.5 mg by mouth at bedtime.   [name] atorvastatin (LIPITOR) 40 MG tablet Take 1 tablet (40 mg total) by mouth daily.  . Carboxymethylcellul-Glycerin (LUBRICATING EYE DROPS OP) Place 1 drop into both eyes 4 (four) times daily as needed (dry eyes).   . Cholecalciferol (VITAMIN D3) 2000 units capsule Take 2,000 Units by mouth daily.   . empagliflozin (JARDIANCE) 10 MG TABS tablet Take 5 mg by mouth daily. Take 1/2 tablet daily (5 mg). Filled at [place].  . insulin  glargine (LANTUS) 100 UNIT/ML injection Inject 60 Units into the skin at bedtime.   [name] levothyroxine (SYNTHROID) 150 MCG tablet Take 150 mcg by mouth daily before breakfast.  . losartan (COZAAR) 50 MG tablet Take 1 tablet (50 mg total) by mouth daily.  . metFORMIN (GLUCOPHAGE) 500 MG tablet Take 500 mg by mouth 2 (two) times daily with a meal. Take 2 in the am; 1 in the pm  . Multiple Vitamins-Minerals (MEGA MULTIVITAMIN FOR MEN PO) Take 1 tablet by mouth daily.  . Olodaterol HCl (STRIVERDI RESPIMAT) 2.5 MCG/ACT AERS Inhale 1 puff into the lungs daily.  [name] oxyCODONE-acetaminophen (PERCOCET/ROXICET) 5-325 MG tablet Take 1 tablet by mouth every 6 (six) hours as needed for severe pain.  . OXYGEN Inhale 2 L into the lungs continuous.  . predniSONE (DELTASONE) 5 MG tablet Take 1 tablet (5 mg total) by mouth daily with breakfast.  . Semaglutide (OZEMPIC, 1 MG/DOSE, [place]) Inject into the skin as directed.  . tamsulosin (FLOMAX) 0.4 MG CAPS capsule Take 1 capsule (0.4 mg total) by mouth at bedtime.  [name] tiotropium (SPIRIVA HANDIHALER) 18 MCG inhalation capsule Place 1 capsule (18 mcg total) into inhaler and inhale daily.     No Known Allergies  Social History   Socioeconomic History  . Marital status: Widowed    Spouse name: Not on file  . Number of children: Not on file  . Years of education: Not on file  . Highest education level:  Not on file  Occupational History  . Occupation: disabled Cytogeneticist, Ex Neurosurgeon: RETIRED  Tobacco Use  . Smoking status: Former Smoker    Packs/day: 2.50    Years: 40.00    Pack years: 100.00    Types: Cigarettes, Pipe, Cigars    Quit date: 07/28/1998    Years since quitting: 22.0  . Smokeless tobacco: Never Used  Vaping Use  . Vaping Use: Never used  Substance and Sexual Activity  . Alcohol use: No    Alcohol/week: 0.0 standard drinks  . Drug use: No  . Sexual activity: Not Currently  Other Topics Concern  . Not on file  Social History  Narrative   Lives in the home with his wife   Sees [place] every 6 month for meds.   Denies asbestos exposure   Social Determinants of Corporate investment banker Strain: Not on file  Food Insecurity: Not on file  Transportation Needs: Not on file  Physical Activity: Not on file  Stress: Not on file  Social Connections: Not on file  Intimate Partner Violence: Not on file     Review of Systems: General: negative for chills, fever, night sweats or weight changes.  Cardiovascular: negative for chest pain, dyspnea on exertion, edema, orthopnea, palpitations, paroxysmal nocturnal dyspnea or shortness of breath Dermatological: negative for rash Respiratory: negative for cough or wheezing Urologic: negative for hematuria Abdominal: negative for nausea, vomiting, diarrhea, bright red blood per rectum, melena, or hematemesis Neurologic: negative for visual changes, syncope, or dizziness All other systems reviewed and are otherwise negative except as noted above.    Blood pressure (!) 164/80, pulse 75, height 6\' 2"  (1.88 m), weight 216 lb (98 kg).  General appearance: alert and no distress Neck: no adenopathy, no carotid bruit, no JVD, supple, symmetrical, trachea midline and thyroid not enlarged, symmetric, no tenderness/mass/nodules Lungs: clear to auscultation bilaterally Heart: regular rate and rhythm, S1, S2 normal, no murmur, click, rub or gallop Extremities: extremities normal, atraumatic, no cyanosis or edema Pulses: Decreased left pedal pulse Skin: Skin color, texture, turgor normal. No rashes or lesions Neurologic: Alert and oriented X 3, normal strength and tone. Normal symmetric reflexes. Normal coordination and gait  EKG not performed today  ASSESSMENT AND PLAN:   Claudication [facility]) Mr. Janes was referred to me by Dr. Vear Clock, his PCP for evaluation of his lifestyle limiting claudication.  He is a cardiology patient of Dr. Okey Dupre.  He had femoral  endarterectomy/embolectomy by Dr. Ludwig Clarks January 2002.  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  He has had left calf claudication for the last 4 to 6 weeks which is new.  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  I believe he needs angiography and fem-tib bypass grafting.  Rather than me perform the angiography and can refer him to Dr. 09/28/2020 for more definitive diagnosis and treatment.  I will see him back as needed.      Chestine Spore MD FACP,FACC,FAHA, [facility] 08/07/2020 8:49 AM

## 2020-08-07 NOTE — Telephone Encounter (Signed)
Spoke with Carma Lair at heart care requesting patient be added on to Dr. Burna Forts schedule in the near future for popliteal aneurysm. Pt reports severe pain in leg. Called pt and left VM to schedule.

## 2020-08-08 NOTE — Telephone Encounter (Signed)
Since I have never prescribed him controlled substances we need more information. He was prescribed 60 only 2 weeks ago. This note is talking about 08/04/20 and 07/31/20 which is about a week ago. Can we find out how often he is taking and why.

## 2020-08-08 NOTE — Telephone Encounter (Signed)
The note below was supposed to state he will run out on 08/10/20. That was my error. Sorry!    I called pt- he states he is taking Oxy/APAP 1 q 6 hour prn as prescribed. He is actually only requesting enough to last him 4 days.

## 2020-08-09 DIAGNOSIS — J449 Chronic obstructive pulmonary disease, unspecified: Secondary | ICD-10-CM | POA: Diagnosis not present

## 2020-08-09 DIAGNOSIS — G4733 Obstructive sleep apnea (adult) (pediatric): Secondary | ICD-10-CM | POA: Diagnosis not present

## 2020-08-10 MED ORDER — OXYCODONE-ACETAMINOPHEN 5-325 MG PO TABS: 1.0000 | ORAL_TABLET | Freq: Four times a day (QID) | ORAL | 0 refills | Status: AC | PRN

## 2020-08-14 ENCOUNTER — Encounter: Payer: Self-pay | Admitting: Vascular Surgery

## 2020-08-14 ENCOUNTER — Ambulatory Visit: Payer: Medicare PPO | Admitting: Vascular Surgery

## 2020-08-14 ENCOUNTER — Other Ambulatory Visit (HOSPITAL_COMMUNITY)
Admission: RE | Admit: 2020-08-14 | Discharge: 2020-08-14 | Disposition: A | Discharge status: Home / Self Care | Payer: Medicare PPO | Source: Ambulatory Visit | Attending: Vascular Surgery | Admitting: Vascular Surgery

## 2020-08-14 ENCOUNTER — Other Ambulatory Visit: Payer: Self-pay

## 2020-08-14 DIAGNOSIS — Z01812 Encounter for preprocedural laboratory examination: Secondary | ICD-10-CM | POA: Insufficient documentation

## 2020-08-14 DIAGNOSIS — I724 Aneurysm of artery of lower extremity: Secondary | ICD-10-CM | POA: Diagnosis not present

## 2020-08-14 DIAGNOSIS — Z20822 Contact with and (suspected) exposure to covid-19: Secondary | ICD-10-CM | POA: Diagnosis not present

## 2020-08-14 LAB — SARS CORONAVIRUS 2 (TAT 6-24 HRS): SARS Coronavirus 2: NEGATIVE

## 2020-08-14 NOTE — Progress Notes (Signed)
Patient name: [name] MRN: [id number] DOB: 07-05-1941 Sex: male  REASON FOR CONSULT: Thrombosed left popliteal artery aneurysm  HPI: [name] is a 80 y.o. male, with history of COPD on 2 L home oxygen, hypertension, hyperlipidemia, coronary artery disease status post right coronary stent in 2018, history of aorto bi-iliac bypass for AAA that presents for evaluation of thrombosed left popliteal artery aneurysm.  Patient states he was in his normal state of health until about Thanksgiving when he started noticing increasing pain in the left leg.  Apparently he was out working in his yard when this started.  He does describe a lot of cramping in the calf when he walks and also a lot of pain in the calf into the foot.  He states this was waking him up at night until he started taking narcotics to alleviate some of this pain.  He has no active tissue loss at this time.  In regards to previous left leg interventions he does describe a left lower extremity embolectomy and and this was done in 2002 by Dr. Hart Rochester.  In addition he had an aortobiiliac graft in 2004 for AAA.    Prior to his referral to vascular surgery he was seen by cardiology.  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  Past Medical History:  Diagnosis Date  . 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 Anxiety   . Aortic aneurysm (HCC) 09/02/2017  . BENIGN PROSTATIC HYPERTROPHY 11/23/2009  . Cardiomyopathy (HCC) 08/28/2016  . Chronic systolic CHF (congestive heart failure) (HCC) 09/02/2017  . COPD (chronic obstructive pulmonary disease) (HCC)   . CORONARY ARTERY DISEASE 11/23/2009  . 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 DEGENERATIVE JOINT DISEASE 11/23/2009  . DEPRESSION 11/23/2009  . FATIGUE 11/23/2009  . GAIT DISTURBANCE 12/10/2009  . HEMOPTYSIS UNSPECIFIED 05/07/2010   . High cholesterol   . HYPERTENSION 07/30/2009  . HYPOTHYROIDISM 07/30/2009  . Ischemic cardiomyopathy 09/02/2017  . 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 On home oxygen therapy    "2-3L; 24/7" (09/10/2016)  . OSA on CPAP   . PTSD (post-traumatic stress disorder) 03/10/2012  . 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 RA (rheumatoid arthritis) (HCC) 06/11/2011   "qwhere" (09/10/2016)  . RESPIRATORY FAILURE, CHRONIC 07/31/2009  . Scleritis of both eyes 03/17/2014  . Thrombocytopenia (HCC)   . 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 Type II diabetes mellitus (HCC)     Past Surgical History:  Procedure Laterality Date  . 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 ABDOMINAL EXPLORATION SURGERY  02/2004   w/LOA/notes 12/10/2010; small bowel obstruction repair with adhesiolysis   . BACK SURGERY    . CARDIAC CATHETERIZATION     2 heart caths in the past.  One in 2000s showed one ulcerated plaque  Rx medically; Second at [facility] [name] 09/05/2016  . CATARACT EXTRACTION W/ INTRAOCULAR LENS  IMPLANT, BILATERAL Bilateral 2000s  . COLECTOMY     hx of remote ileum resection due to bleeding  . COLONOSCOPY WITH PROPOFOL N/A 01/22/2018   Procedure: COLONOSCOPY WITH PROPOFOL;  Surgeon: Iva Boop, MD;  Location: [facility] ENDOSCOPY;  Service: Endoscopy;  Laterality: N/A;  . CORONARY ANGIOPLASTY WITH STENT PLACEMENT  09/10/2016  . CORONARY STENT INTERVENTION N/A 09/10/2016   Procedure: Coronary Stent Intervention;  Surgeon: Kathleene Hazel, MD;  Location:  [facility] INVASIVE CV LAB;  Service: Cardiovascular;  Laterality: N/A;  Distal RCA 4.0x16 Synergy  . ESOPHAGOGASTRODUODENOSCOPY (EGD) WITH PROPOFOL N/A 01/22/2018   Procedure: ESOPHAGOGASTRODUODENOSCOPY (EGD) WITH PROPOFOL;  Surgeon: Iva Boop, MD;  Location: [facility] ENDOSCOPY;  Service: Endoscopy;  Laterality: N/A;  . FEMORAL EMBOLOECTOMY Left 07/2000   with left leg ischemia; Dr. Hart Rochester, vascular  . GANGLION CYST EXCISION Right    "wrist"; Dr. Teressa Senter  . HOT  HEMOSTASIS N/A 01/22/2018   Procedure: HOT HEMOSTASIS (ARGON PLASMA COAGULATION/BICAP);  Surgeon: Iva Boop, MD;  Location: [facility];  Service: Endoscopy;  Laterality: N/A;  . LOOP RECORDER INSERTION N/A 04/25/2019   Procedure: LOOP RECORDER INSERTION;  Surgeon: Duke Salvia, MD;  Location: [facility] INVASIVE CV LAB;  Service: Cardiovascular;  Laterality: N/A;  . LOOP RECORDER REMOVAL N/A 07/01/2019   Procedure: LOOP RECORDER REMOVAL;  Surgeon: Duke Salvia, MD;  Location: [facility] INVASIVE CV LAB;  Service: Cardiovascular;  Laterality: N/A;  . LUMBAR LAMINECTOMY  1972   Dr. Fannie Knee  . PACEMAKER IMPLANT N/A 07/01/2019   Procedure: PACEMAKER IMPLANT;  Surgeon: Duke Salvia, MD;  Location: [facility] INVASIVE CV LAB;  Service: Cardiovascular;  Laterality: N/A;  . RIGHT/LEFT HEART CATH AND CORONARY ANGIOGRAPHY N/A 09/10/2016   Procedure: Right/Left Heart Cath and Coronary Angiography;  Surgeon: Kathleene Hazel, MD;  Location: [facility] INVASIVE CV LAB;  Service: Cardiovascular;  Laterality: N/A;  . TONSILLECTOMY      Family History  Problem Relation Age of Onset  . Other Mother        gun shot    SOCIAL HISTORY: Social History   Socioeconomic History  . Marital status: Widowed    Spouse name: Not on file  . Number of children: Not on file  . Years of education: Not on file  . Highest education level: Not on file  Occupational History  . Occupation: disabled Cytogeneticist, Ex Neurosurgeon: RETIRED  Tobacco Use  . Smoking status: Former Smoker    Packs/day: 2.50    Years: 40.00    Pack years: 100.00    Types: Cigarettes, Pipe, Cigars    Quit date: 07/28/1998    Years since quitting: 22.0  . Smokeless tobacco: Never Used  Vaping Use  . Vaping Use: Never used  Substance and Sexual Activity  . Alcohol use: No    Alcohol/week: 0.0 standard drinks  . Drug use: No  . Sexual activity: Not Currently  Other Topics Concern  . Not on file  Social History Narrative   Lives in the home  with his wife   Sees [place] every 6 month for meds.   Denies asbestos exposure   Social Determinants of Corporate investment banker Strain: Not on file  Food Insecurity: Not on file  Transportation Needs: Not on file  Physical Activity: Not on file  Stress: Not on file  Social Connections: Not on file  Intimate Partner Violence: Not on file    No Known Allergies  Current Outpatient Medications  Medication Sig Dispense Refill  . albuterol (PROVENTIL) (2.5 MG/3ML) 0.083% nebulizer solution 1 vial in nebulizer every 6 hours as needed Dx 496 120 mL 6  . ALPRAZolam (XANAX) 0.5 MG tablet Take 0.5 mg by mouth at bedtime.     [name] atorvastatin (LIPITOR) 40 MG tablet Take 1 tablet (40 mg total) by mouth daily. 90 tablet 3  . Carboxymethylcellul-Glycerin (LUBRICATING EYE DROPS OP) Place 1 drop into both eyes 4 (four) times  daily as needed (dry eyes).     . Cholecalciferol (VITAMIN D3) 2000 units capsule Take 2,000 Units by mouth daily.     . empagliflozin (JARDIANCE) 10 MG TABS tablet Take 5 mg by mouth daily. Take 1/2 tablet daily (5 mg). Filled at [place].    . insulin glargine (LANTUS) 100 UNIT/ML injection Inject 60 Units into the skin at bedtime.     [name] levothyroxine (SYNTHROID) 150 MCG tablet Take 150 mcg by mouth daily before breakfast.    . losartan (COZAAR) 50 MG tablet Take 1 tablet (50 mg total) by mouth daily. 90 tablet 3  . metFORMIN (GLUCOPHAGE) 500 MG tablet Take 500 mg by mouth 2 (two) times daily with a meal. Take 2 in the am; 1 in the pm    . Multiple Vitamins-Minerals (MEGA MULTIVITAMIN FOR MEN PO) Take 1 tablet by mouth daily.    . Olodaterol HCl (STRIVERDI RESPIMAT) 2.5 MCG/ACT AERS Inhale 1 puff into the lungs daily.    [name] oxyCODONE-acetaminophen (PERCOCET/ROXICET) 5-325 MG tablet Take 1 tablet by mouth every 6 (six) hours as needed for up to 5 days for severe pain. 20 tablet 0  . OXYGEN Inhale 2 L into the lungs continuous.    . predniSONE (DELTASONE) 5 MG tablet Take 1 tablet (5 mg  total) by mouth daily with breakfast. 90 tablet 3  . Semaglutide (OZEMPIC, 1 MG/DOSE, ) Inject into the skin as directed.    . tamsulosin (FLOMAX) 0.4 MG CAPS capsule Take 1 capsule (0.4 mg total) by mouth at bedtime. 30 capsule 0  . tiotropium (SPIRIVA HANDIHALER) 18 MCG inhalation capsule Place 1 capsule (18 mcg total) into inhaler and inhale daily. 30 capsule 4  . furosemide (LASIX) 40 MG tablet Take 1 tablet (40 mg total) by mouth daily. 90 tablet 3   No current facility-administered medications for this visit.    REVIEW OF SYSTEMS:  [X]  denotes positive finding, [ ]  denotes negative finding Cardiac  Comments:  Chest pain or chest pressure:    Shortness of breath upon exertion: x   Short of breath when lying flat:    Irregular heart rhythm:        Vascular    Pain in calf, thigh, or hip brought on by ambulation: x   Pain in feet at night that wakes you up from your sleep:     Blood clot in your veins:    Leg swelling:  x       Pulmonary    Oxygen at home:    Productive cough:     Wheezing:         Neurologic    Sudden weakness in arms or legs:     Sudden numbness in arms or legs:     Sudden onset of difficulty speaking or slurred speech:    Temporary loss of vision in one eye:     Problems with dizziness:  x       Gastrointestinal    Blood in stool:     Vomited blood:         Genitourinary    Burning when urinating:     Blood in urine:        Psychiatric    Major depression:         Hematologic    Bleeding problems:    Problems with blood clotting too easily:        Skin    Rashes or ulcers:  Constitutional    Fever or chills:      PHYSICAL EXAM: Vitals:   08/14/20 1058  BP: (!) 160/75  Pulse: (!) 58  Resp: 18  Temp: 97.7 F (36.5 C)  TempSrc: Temporal  SpO2: (!) 18%  Weight: 213 lb (96.6 kg)  Height: 6\' 2"  (1.88 m)    GENERAL: The patient is a well-nourished male, in no acute distress. The vital signs are documented above. CARDIAC:  There is a regular rate and rhythm.  VASCULAR:  Palpable femoral pulses both groins Right popliteal pulse easily palpable and prominent Left popliteal pulse nonpalpable Right DP palpable Left PT signal but nonpalpable No lower extremity tissue loss PULMONARY: No respiratory distress.  2 L [place]. ABDOMEN: Soft and non-tender. MUSCULOSKELETAL: There are no major deformities or cyanosis. NEUROLOGIC: No focal weakness or paresthesias are detected. SKIN: There are no ulcers or rashes noted. PSYCHIATRIC: The patient has a normal affect.  DATA:   Lower extremity arterial duplex from 07/31/2020 shows a 2.8 cm right popliteal artery aneurysm and a thrombosed 2.5 cm left popliteal artery aneurysm  Assessment/Plan:  80 year old male presents for evaluation of a thrombosed 2.5 cm left popliteal artery aneurysm.  Discussed with him in detail that in addition to a thrombosed left popliteal artery aneurysm he also has a 2.8 cm right popliteal aneurysm. Discussed that current guidelines are to repair popliteal aneurysms greater than 2 to 2.5 cm. The initial focus should be his thrombosed left popliteal artery aneurysm given he having significant left leg symptoms.  I recommended a left leg arteriogram for identification of a target and we will also get vein mapping in the hospital later this week and likely require bypass in the left leg if he has a target.  Discussed at the time we would also get right leg runoff and evaluate for surgical options for his right popliteal artery aneurysm at a later time.  We discussed primary risk of popliteal artery aneurysm is thrombosis and embolism putting him at risk of limb loss.   71, MD [facility] Office: [phone]

## 2020-08-16 ENCOUNTER — Ambulatory Visit (HOSPITAL_COMMUNITY)
Admission: RE | Admit: 2020-08-16 | Discharge: 2020-08-16 | Disposition: A | Discharge status: Home / Self Care | Payer: Medicare PPO | Attending: Vascular Surgery | Admitting: Vascular Surgery

## 2020-08-16 ENCOUNTER — Encounter (HOSPITAL_COMMUNITY): Payer: Self-pay | Admitting: Vascular Surgery

## 2020-08-16 ENCOUNTER — Other Ambulatory Visit: Payer: Self-pay

## 2020-08-16 ENCOUNTER — Ambulatory Visit (HOSPITAL_BASED_OUTPATIENT_CLINIC_OR_DEPARTMENT_OTHER): Payer: Medicare PPO

## 2020-08-16 ENCOUNTER — Encounter (HOSPITAL_COMMUNITY)
Admission: RE | Disposition: A | Discharge status: Home / Self Care | Payer: Self-pay | Source: Home / Self Care | Attending: Vascular Surgery

## 2020-08-16 DIAGNOSIS — I10 Essential (primary) hypertension: Secondary | ICD-10-CM | POA: Insufficient documentation

## 2020-08-16 DIAGNOSIS — I724 Aneurysm of artery of lower extremity: Secondary | ICD-10-CM | POA: Insufficient documentation

## 2020-08-16 DIAGNOSIS — Z87891 Personal history of nicotine dependence: Secondary | ICD-10-CM | POA: Insufficient documentation

## 2020-08-16 DIAGNOSIS — I739 Peripheral vascular disease, unspecified: Secondary | ICD-10-CM | POA: Diagnosis not present

## 2020-08-16 DIAGNOSIS — I743 Embolism and thrombosis of arteries of the lower extremities: Secondary | ICD-10-CM | POA: Diagnosis not present

## 2020-08-16 DIAGNOSIS — Z79899 Other long term (current) drug therapy: Secondary | ICD-10-CM | POA: Insufficient documentation

## 2020-08-16 DIAGNOSIS — E785 Hyperlipidemia, unspecified: Secondary | ICD-10-CM | POA: Diagnosis not present

## 2020-08-16 DIAGNOSIS — Z7952 Long term (current) use of systemic steroids: Secondary | ICD-10-CM | POA: Diagnosis not present

## 2020-08-16 DIAGNOSIS — J449 Chronic obstructive pulmonary disease, unspecified: Secondary | ICD-10-CM | POA: Insufficient documentation

## 2020-08-16 DIAGNOSIS — I2510 Atherosclerotic heart disease of native coronary artery without angina pectoris: Secondary | ICD-10-CM | POA: Diagnosis not present

## 2020-08-16 DIAGNOSIS — Z794 Long term (current) use of insulin: Secondary | ICD-10-CM | POA: Insufficient documentation

## 2020-08-16 HISTORY — PX: ABDOMINAL AORTOGRAM W/LOWER EXTREMITY: CATH118223

## 2020-08-16 LAB — POCT I-STAT, CHEM 8
BUN: 22 mg/dL (ref 8–23)
Calcium, Ion: 1.3 mmol/L (ref 1.15–1.40)
Chloride: 100 mmol/L (ref 98–111)
Creatinine, Ser: 1.3 mg/dL — ABNORMAL HIGH (ref 0.61–1.24)
Glucose, Bld: 160 mg/dL — ABNORMAL HIGH (ref 70–99)
HCT: 37 % — ABNORMAL LOW (ref 39.0–52.0)
Hemoglobin: 12.6 g/dL — ABNORMAL LOW (ref 13.0–17.0)
Potassium: 4 mmol/L (ref 3.5–5.1)
Sodium: 141 mmol/L (ref 135–145)
TCO2: 28 mmol/L (ref 22–32)

## 2020-08-16 SURGERY — ABDOMINAL AORTOGRAM W/LOWER EXTREMITY
Anesthesia: LOCAL

## 2020-08-16 MED ORDER — MIDAZOLAM HCL 2 MG/2ML IJ SOLN
INTRAMUSCULAR | Status: AC
  Filled 2020-08-16: qty 2

## 2020-08-16 MED ORDER — MIDAZOLAM HCL 2 MG/2ML IJ SOLN
INTRAMUSCULAR | Status: DC | PRN
  Administered 2020-08-16: 1 mg via INTRAVENOUS

## 2020-08-16 MED ORDER — FENTANYL CITRATE (PF) 100 MCG/2ML IJ SOLN
INTRAMUSCULAR | Status: DC | PRN
  Administered 2020-08-16: 25 ug via INTRAVENOUS

## 2020-08-16 MED ORDER — LIDOCAINE HCL (PF) 1 % IJ SOLN
INTRAMUSCULAR | Status: DC | PRN
  Administered 2020-08-16: 15 mL via INTRADERMAL

## 2020-08-16 MED ORDER — SODIUM CHLORIDE 0.9 % IV SOLN: INTRAVENOUS | Status: DC

## 2020-08-16 MED ORDER — SODIUM CHLORIDE 0.9 % IV SOLN: 250.0000 mL | INTRAVENOUS | Status: DC | PRN

## 2020-08-16 MED ORDER — SODIUM CHLORIDE 0.9% FLUSH: 3.0000 mL | INTRAVENOUS | Status: DC | PRN

## 2020-08-16 MED ORDER — FENTANYL CITRATE (PF) 100 MCG/2ML IJ SOLN
INTRAMUSCULAR | Status: AC
  Filled 2020-08-16: qty 2

## 2020-08-16 MED ORDER — ONDANSETRON HCL 4 MG/2ML IJ SOLN: 4.0000 mg | Freq: Four times a day (QID) | INTRAMUSCULAR | Status: DC | PRN

## 2020-08-16 MED ORDER — SODIUM CHLORIDE 0.9 % WEIGHT BASED INFUSION: 1.0000 mL/kg/h | INTRAVENOUS | Status: DC

## 2020-08-16 MED ORDER — LABETALOL HCL 5 MG/ML IV SOLN: 10.0000 mg | INTRAVENOUS | Status: DC | PRN

## 2020-08-16 MED ORDER — IODIXANOL 320 MG/ML IV SOLN
INTRAVENOUS | Status: DC | PRN
  Administered 2020-08-16: 132 mL via INTRA_ARTERIAL

## 2020-08-16 MED ORDER — HEPARIN (PORCINE) IN NACL 1000-0.9 UT/500ML-% IV SOLN
INTRAVENOUS | Status: DC | PRN
  Administered 2020-08-16 (×2): 500 mL

## 2020-08-16 MED ORDER — SODIUM CHLORIDE 0.9% FLUSH: 3.0000 mL | Freq: Two times a day (BID) | INTRAVENOUS | Status: DC

## 2020-08-16 MED ORDER — HEPARIN (PORCINE) IN NACL 1000-0.9 UT/500ML-% IV SOLN
INTRAVENOUS | Status: AC
  Filled 2020-08-16: qty 1000

## 2020-08-16 MED ORDER — LIDOCAINE HCL (PF) 1 % IJ SOLN
INTRAMUSCULAR | Status: AC
  Filled 2020-08-16: qty 30

## 2020-08-16 MED ORDER — ACETAMINOPHEN 325 MG PO TABS: 650.0000 mg | ORAL_TABLET | ORAL | Status: DC | PRN

## 2020-08-16 MED ORDER — HYDRALAZINE HCL 20 MG/ML IJ SOLN: 5.0000 mg | INTRAMUSCULAR | Status: DC | PRN

## 2020-08-16 SURGICAL SUPPLY — 10 items
CATH OMNI FLUSH 5F 65CM (CATHETERS) ×2 IMPLANT
CATH SOFTOUCH MOTARJEME 5F (CATHETERS) ×2 IMPLANT
DEVICE CLOSURE MYNXGRIP 5F (Vascular Products) ×2 IMPLANT
KIT MICROPUNCTURE NIT STIFF (SHEATH) ×2 IMPLANT
KIT PV (KITS) ×2 IMPLANT
SHEATH PINNACLE 5F 10CM (SHEATH) ×1 IMPLANT
SYR MEDRAD MARK V 150ML (SYRINGE) ×2 IMPLANT
TRANSDUCER W/STOPCOCK (MISCELLANEOUS) ×2 IMPLANT
TRAY PV CATH (CUSTOM PROCEDURE TRAY) ×2 IMPLANT
WIRE BENTSON .035X145CM (WIRE) ×2 IMPLANT

## 2020-08-16 NOTE — Op Note (Signed)
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 US guided access of right common femoral artery 2. Aortogram including catheter selection of aorta 3. Biateral lower extremity arteriogram with runoff 4. Mynx closure of the right common femoral artery 5. 30 minutes of monitored moderate conscious sedation time  Indications: 80 year old male who presents with thrombosed left popliteal artery aneurysm with claudication progressing to rest pain since Thanksgiving. He presents today for planned lower extremity arteriogram with runoff to evaluate for bypass target in the left leg.  He has a history of an aortobiiliac bypass for an aneurysm by Dr. 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  Risks and benefits were discussed.  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  No flow limiting stenosis in aortoiliac segment.  Left lower extremity arteriogram which is the side of interest shows a small common femoral aneurysm with a patent profunda and a patent SFA that is diseased in the mid to distal segment with an occluded popliteal artery aneurysm behind the knee. He has a high takeoff of the posterior tibial artery and appears to have a target just below the high posterior tibial artery takeoff with dominant runoff through the peroneal and posterior tibial artery in the left leg. Anterior tibial occludes shortly after takeoff and no runoff into the foot.  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   Procedure:  The patient was identified in the holding area and taken to room 8.  The patient was then placed supine on the table and prepped and draped in the usual sterile fashion.  A time out was called.  Ultrasound was used to evaluate the right common femoral artery.  It was patent .  A digital ultrasound image was acquired.  A micropuncture needle was used to access the right common femoral artery under ultrasound guidance.  An 018 wire was advanced without resistance and a micropuncture sheath was placed.  The 018 wire was removed and a benson wire was placed.  The micropuncture sheath was exchanged for a 5 french sheath.  An omniflush catheter was advanced over the wire to the level of L-1.  An abdominal angiogram was obtained.  We then pulled the catheter down and bilateral runoff was obtained.  Next we exchanged for a motorjame catheter at the bifurcation of the previous aortobiiliac bypass and the catheter was placed down the left limb of the graft. We got left leg runoff to evaluate the tibials for targets and also a lateral foot angiogram. Appears his best target is just below the high takeoff of the posterior tibial artery. Dominant runoff is through the posterior tibial and peroneal artery. Wires and catheters were removed. A mynx closure was deployed in the right common femoral artery.  Plan: Patient will get vein mapping today. I'll schedule him for left femoral to popliteal artery bypass next week for occluded left popliteal artery aneurysm.   Cristal Deer  Sondra Barges, MD [facility] Office: [phone]  Cephus Shelling

## 2020-08-16 NOTE — Progress Notes (Signed)
Patient and daughter was given discharge instructions. Both verbalized understanding. 

## 2020-08-16 NOTE — H&P (Signed)
History and Physical Interval Note:  08/16/2020 9:23 AM  [name]  has presented today for surgery, with the diagnosis of aneurysm.  The various methods of treatment have been discussed with the patient and family. After consideration of risks, benefits and other options for treatment, the patient has consented to  Procedure(s): ABDOMINAL AORTOGRAM W/LOWER EXTREMITY (N/A) as a surgical intervention.  The patient's history has been reviewed, patient examined, no change in status, stable for surgery.  I have reviewed the patient's chart and labs.  Questions were answered to the patient's satisfaction.    Aortogram, LE arteriogram, thrombosed left popliteal artery aneurysm.  Cephus Shelling  Patient name: [name]         MRN: [id number]        DOB: [birth year]          Sex: male  REASON FOR CONSULT: Thrombosed left popliteal artery aneurysm  HPI: [name] is a 80 y.o. male, with history of COPD on 2 L home oxygen, hypertension, hyperlipidemia, coronary artery disease status post right coronary stent in 2018, history of aorto bi-iliac bypass for AAA that presents for evaluation of thrombosed left popliteal artery aneurysm.  Patient states he was in his normal state of health until about Thanksgiving when he started noticing increasing pain in the left leg.  Apparently he was out working in his yard when this started.  He does describe a lot of cramping in the calf when he walks and also a lot of pain in the calf into the foot.  He states this was waking him up at night until he started taking narcotics to alleviate some of this pain.  He has no active tissue loss at this time.  In regards to previous left leg interventions he does describe a left lower extremity embolectomy and and this was done in 2002 by Dr. Hart Rochester.  In addition he had an aortobiiliac graft in 2004 for AAA.    Prior to his referral to vascular surgery he was seen by cardiology.  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      Past Medical History:  Diagnosis Date  . 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 Anxiety   . Aortic aneurysm (HCC) 09/02/2017  . BENIGN PROSTATIC HYPERTROPHY 11/23/2009  . Cardiomyopathy (HCC) 08/28/2016  . Chronic systolic CHF (congestive heart failure) (HCC) 09/02/2017  . COPD (chronic obstructive pulmonary disease) (HCC)   . CORONARY ARTERY DISEASE 11/23/2009  . 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 DEGENERATIVE JOINT DISEASE 11/23/2009  . DEPRESSION 11/23/2009  . FATIGUE 11/23/2009  . GAIT DISTURBANCE 12/10/2009  . HEMOPTYSIS UNSPECIFIED 05/07/2010  . High cholesterol   . HYPERTENSION 07/30/2009  . HYPOTHYROIDISM 07/30/2009  . Ischemic cardiomyopathy 09/02/2017  . 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 On home oxygen therapy    "2-3L; 24/7" (09/10/2016)  . OSA on CPAP   . PTSD (post-traumatic stress disorder) 03/10/2012  . 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 RA (rheumatoid arthritis) (HCC) 06/11/2011   "qwhere" (09/10/2016)  . RESPIRATORY FAILURE, CHRONIC 07/31/2009  . Scleritis of both eyes 03/17/2014  . Thrombocytopenia (HCC)   . 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 Type II diabetes mellitus (HCC)          Past Surgical History:  Procedure Laterality Date  . ABDOMINAL AORTIC ANEURYSM REPAIR  07/2002   /  notes 12/10/2010  . ABDOMINAL EXPLORATION SURGERY  02/2004   w/LOA/notes 12/10/2010; small bowel obstruction repair with adhesiolysis   . BACK SURGERY    . CARDIAC CATHETERIZATION     2 heart caths in the past.  One in 2000s showed one ulcerated plaque  Rx medically; Second at [name] 09/05/2016  . CATARACT EXTRACTION W/ INTRAOCULAR LENS  IMPLANT, BILATERAL Bilateral 2000s  . COLECTOMY     hx of remote ileum resection due to bleeding  . COLONOSCOPY WITH PROPOFOL N/A 01/22/2018   Procedure:  COLONOSCOPY WITH PROPOFOL;  Surgeon: Iva Boop, MD;  Location: [facility];  Service: Endoscopy;  Laterality: N/A;  . CORONARY ANGIOPLASTY WITH STENT PLACEMENT  09/10/2016  . CORONARY STENT INTERVENTION N/A 09/10/2016   Procedure: Coronary Stent Intervention;  Surgeon: Kathleene Hazel, MD;  Location: [facility] INVASIVE CV LAB;  Service: Cardiovascular;  Laterality: N/A;  Distal RCA 4.0x16 Synergy  . ESOPHAGOGASTRODUODENOSCOPY (EGD) WITH PROPOFOL N/A 01/22/2018   Procedure: ESOPHAGOGASTRODUODENOSCOPY (EGD) WITH PROPOFOL;  Surgeon: Iva Boop, MD;  Location: [facility];  Service: Endoscopy;  Laterality: N/A;  . FEMORAL EMBOLOECTOMY Left 07/2000   with left leg ischemia; Dr. Hart Rochester, vascular  . GANGLION CYST EXCISION Right    "wrist"; Dr. Teressa Senter  . HOT HEMOSTASIS N/A 01/22/2018   Procedure: HOT HEMOSTASIS (ARGON PLASMA COAGULATION/BICAP);  Surgeon: Iva Boop, MD;  Location: [facility];  Service: Endoscopy;  Laterality: N/A;  . LOOP RECORDER INSERTION N/A 04/25/2019   Procedure: LOOP RECORDER INSERTION;  Surgeon: Duke Salvia, MD;  Location: [facility] INVASIVE CV LAB;  Service: Cardiovascular;  Laterality: N/A;  . LOOP RECORDER REMOVAL N/A 07/01/2019   Procedure: LOOP RECORDER REMOVAL;  Surgeon: Duke Salvia, MD;  Location: [facility] INVASIVE CV LAB;  Service: Cardiovascular;  Laterality: N/A;  . LUMBAR LAMINECTOMY  1972   Dr. Fannie Knee  . PACEMAKER IMPLANT N/A 07/01/2019   Procedure: PACEMAKER IMPLANT;  Surgeon: Duke Salvia, MD;  Location: [facility] INVASIVE CV LAB;  Service: Cardiovascular;  Laterality: N/A;  . RIGHT/LEFT HEART CATH AND CORONARY ANGIOGRAPHY N/A 09/10/2016   Procedure: Right/Left Heart Cath and Coronary Angiography;  Surgeon: Kathleene Hazel, MD;  Location: [facility] INVASIVE CV LAB;  Service: Cardiovascular;  Laterality: N/A;  . TONSILLECTOMY           Family History  Problem Relation Age of Onset  . Other Mother        gun shot    SOCIAL  HISTORY: Social History        Socioeconomic History  . Marital status: Widowed    Spouse name: Not on file  . Number of children: Not on file  . Years of education: Not on file  . Highest education level: Not on file  Occupational History  . Occupation: disabled Cytogeneticist, Ex Neurosurgeon: RETIRED  Tobacco Use  . Smoking status: Former Smoker    Packs/day: 2.50    Years: 40.00    Pack years: 100.00    Types: Cigarettes, Pipe, Cigars    Quit date: 07/28/1998    Years since quitting: 22.0  . Smokeless tobacco: Never Used  Vaping Use  . Vaping Use: Never used  Substance and Sexual Activity  . Alcohol use: No    Alcohol/week: 0.0 standard drinks  . Drug use: No  . Sexual activity: Not Currently  Other Topics Concern  . Not on file  Social History Narrative   Lives in the home with his wife  [facility]   Denies asbestos exposure   Social Determinants of Corporate investment banker Strain: Not on file  Food Insecurity: Not on file  Transportation Needs: Not on file  Physical Activity: Not on file  Stress: Not on file  Social Connections: Not on file  Intimate Partner Violence: Not on file    No Known Allergies        Current Outpatient Medications  Medication Sig Dispense Refill  . albuterol (PROVENTIL) (2.5 MG/3ML) 0.083% nebulizer solution 1 vial in nebulizer every 6 hours as needed Dx 496 120 mL 6  . ALPRAZolam (XANAX) 0.5 MG tablet Take 0.5 mg by mouth at bedtime.     [name] atorvastatin (LIPITOR) 40 MG tablet Take 1 tablet (40 mg total) by mouth daily. 90 tablet 3  . Carboxymethylcellul-Glycerin (LUBRICATING EYE DROPS OP) Place 1 drop into both eyes 4 (four) times daily as needed (dry eyes).     . Cholecalciferol (VITAMIN D3) 2000 units capsule Take 2,000 Units by mouth daily.     . empagliflozin (JARDIANCE) 10 MG TABS tablet Take 5 mg by mouth daily. Take 1/2 tablet daily (5 mg). Filled at [place].    .  insulin glargine (LANTUS) 100 UNIT/ML injection Inject 60 Units into the skin at bedtime.     [name] levothyroxine (SYNTHROID) 150 MCG tablet Take 150 mcg by mouth daily before breakfast.    . losartan (COZAAR) 50 MG tablet Take 1 tablet (50 mg total) by mouth daily. 90 tablet 3  . metFORMIN (GLUCOPHAGE) 500 MG tablet Take 500 mg by mouth 2 (two) times daily with a meal. Take 2 in the am; 1 in the pm    . Multiple Vitamins-Minerals (MEGA MULTIVITAMIN FOR MEN PO) Take 1 tablet by mouth daily.    . Olodaterol HCl (STRIVERDI RESPIMAT) 2.5 MCG/ACT AERS Inhale 1 puff into the lungs daily.    [name] oxyCODONE-acetaminophen (PERCOCET/ROXICET) 5-325 MG tablet Take 1 tablet by mouth every 6 (six) hours as needed for up to 5 days for severe pain. 20 tablet 0  . OXYGEN Inhale 2 L into the lungs continuous.    . predniSONE (DELTASONE) 5 MG tablet Take 1 tablet (5 mg total) by mouth daily with breakfast. 90 tablet 3  . Semaglutide (OZEMPIC, 1 MG/DOSE, [place]) Inject into the skin as directed.    . tamsulosin (FLOMAX) 0.4 MG CAPS capsule Take 1 capsule (0.4 mg total) by mouth at bedtime. 30 capsule 0  . tiotropium (SPIRIVA HANDIHALER) 18 MCG inhalation capsule Place 1 capsule (18 mcg total) into inhaler and inhale daily. 30 capsule 4  . furosemide (LASIX) 40 MG tablet Take 1 tablet (40 mg total) by mouth daily. 90 tablet 3   No current facility-administered medications for this visit.    REVIEW OF SYSTEMS:  [X]  denotes positive finding, [ ]  denotes negative finding Cardiac  Comments:  Chest pain or chest pressure:    Shortness of breath upon exertion: x   Short of breath when lying flat:    Irregular heart rhythm:        Vascular    Pain in calf, thigh, or hip brought on by ambulation: x   Pain in feet at night that wakes you up from your sleep:     Blood clot in your veins:    Leg swelling:  x       Pulmonary    Oxygen at home:    Productive cough:  Wheezing:          Neurologic    Sudden weakness in arms or legs:     Sudden numbness in arms or legs:     Sudden onset of difficulty speaking or slurred speech:    Temporary loss of vision in one eye:     Problems with dizziness:  x       Gastrointestinal    Blood in stool:     Vomited blood:         Genitourinary    Burning when urinating:     Blood in urine:        Psychiatric    Major depression:         Hematologic    Bleeding problems:    Problems with blood clotting too easily:        Skin    Rashes or ulcers:        Constitutional    Fever or chills:      PHYSICAL EXAM:    Vitals:   08/14/20 1058  BP: (!) 160/75  Pulse: (!) 58  Resp: 18  Temp: 97.7 F (36.5 C)  TempSrc: Temporal  SpO2: (!) 18%  Weight: 213 lb (96.6 kg)  Height: 6\' 2"  (1.88 m)    GENERAL: The patient is a well-nourished male, in no acute distress. The vital signs are documented above. CARDIAC: There is a regular rate and rhythm.  VASCULAR:  Palpable femoral pulses both groins Right popliteal pulse easily palpable and prominent Left popliteal pulse nonpalpable Right DP palpable Left PT signal but nonpalpable No lower extremity tissue loss PULMONARY: No respiratory distress.  2 L [place]. ABDOMEN: Soft and non-tender. MUSCULOSKELETAL: There are no major deformities or cyanosis. NEUROLOGIC: No focal weakness or paresthesias are detected. SKIN: There are no ulcers or rashes noted. PSYCHIATRIC: The patient has a normal affect.  DATA:   Lower extremity arterial duplex from 07/31/2020 shows a 2.8 cm right popliteal artery aneurysm and a thrombosed 2.5 cm left popliteal artery aneurysm  Assessment/Plan:  80 year old male presents for evaluation of a thrombosed 2.5 cm left popliteal artery aneurysm.  Discussed with him in detail that in addition to a thrombosed left popliteal artery aneurysm he also has a 2.8 cm right popliteal  aneurysm. Discussed that current guidelines are to repair popliteal aneurysms greater than 2 to 2.5 cm. The initial focus should be his thrombosed left popliteal artery aneurysm given he having significant left leg symptoms.  I recommended a left leg arteriogram for identification of a target and we will also get vein mapping in the hospital later this week and likely require bypass in the left leg if he has a target.  Discussed at the time we would also get right leg runoff and evaluate for surgical options for his right popliteal artery aneurysm at a later time.  We discussed primary risk of popliteal artery aneurysm is thrombosis and embolism putting him at risk of limb loss.   71, MD [facility] Office: [phone]

## 2020-08-16 NOTE — Progress Notes (Signed)
Bilateral lower extremity great saphenous vein mapping has been completed. Preliminary results can be found in CV Proc through chart review.   08/16/20 12:48 PM Olen Cordial RVT

## 2020-08-16 NOTE — Discharge Instructions (Signed)
Femoral Site Care  This sheet gives you information about how to care for yourself after your procedure. Your health care provider may also give you more specific instructions. If you have problems or questions, contact your health care provider. What can I expect after the procedure? After the procedure, it is common to have:  Bruising that usually fades within 1-2 weeks.  Tenderness at the site. Follow these instructions at home: Wound care  Follow instructions from your health care provider about how to take care of your insertion site. Make sure you: ? Wash your hands with soap and water before you change your bandage (dressing). If soap and water are not available, use hand sanitizer. ? Change your dressing as told by your health care provider. ? Leave stitches (sutures), skin glue, or adhesive strips in place. These skin closures may need to stay in place for 2 weeks or longer. If adhesive strip edges start to loosen and curl up, you may trim the loose edges. Do not remove adhesive strips completely unless your health care provider tells you to do that.  Do not take baths, swim, or use a hot tub until your health care provider approves.  You may shower 24-48 hours after the procedure or as told by your health care provider. ? Gently wash the site with plain soap and water. ? Pat the area dry with a clean towel. ? Do not rub the site. This may cause bleeding.  Do not apply powder or lotion to the site. Keep the site clean and dry.  Check your femoral site every day for signs of infection. Check for: ? Redness, swelling, or pain. ? Fluid or blood. ? Warmth. ? Pus or a bad smell. Activity  For the first 2-3 days after your procedure, or as long as directed: ? Avoid climbing stairs as much as possible. ? Do not squat.  Do not lift anything that is heavier than 10 lb (4.5 kg), or the limit that you are told, until your health care provider says that it is safe.  Rest as  directed. ? Avoid sitting for a long time without moving. Get up to take short walks every 1-2 hours.  Do not drive for 24 hours if you were given a medicine to help you relax (sedative). General instructions  Take over-the-counter and prescription medicines only as told by your health care provider.  Keep all follow-up visits as told by your health care provider. This is important. Contact a health care provider if you have:  A fever or chills.  You have redness, swelling, or pain around your insertion site. Get help right away if:  The catheter insertion area swells very fast.  You pass out.  You suddenly start to sweat or your skin gets clammy.  The catheter insertion area is bleeding, and the bleeding does not stop when you hold steady pressure on the area.  The area near or just beyond the catheter insertion site becomes pale, cool, tingly, or numb. These symptoms may represent a serious problem that is an emergency. Do not wait to see if the symptoms will go away. Get medical help right away. Call your local emergency services (911 in the [place] Do not drive yourself to the hospital. Summary  After the procedure, it is common to have bruising that usually fades within 1-2 weeks.  Check your femoral site every day for signs of infection.  Do not lift anything that is heavier than 10 lb (4.5 kg), or   the limit that you are told, until your health care provider says that it is safe. This information is not intended to replace advice given to you by your health care provider. Make sure you discuss any questions you have with your health care provider. Document Revised: 03/16/2020 Document Reviewed: 03/16/2020 Elsevier Patient Education  2021 [employer]  

## 2020-08-17 ENCOUNTER — Other Ambulatory Visit: Payer: Self-pay

## 2020-08-17 ENCOUNTER — Other Ambulatory Visit: Payer: Self-pay | Admitting: Physician Assistant

## 2020-08-17 MED ORDER — OXYCODONE-ACETAMINOPHEN 5-325 MG PO TABS: 1.0000 | ORAL_TABLET | Freq: Four times a day (QID) | ORAL | 0 refills | Status: DC | PRN

## 2020-08-17 NOTE — Telephone Encounter (Signed)
See below

## 2020-08-17 NOTE — Telephone Encounter (Signed)
Refilled

## 2020-08-17 NOTE — Addendum Note (Signed)
Addended by: Hillard Danker A on: 08/17/2020 02:53 PM   Modules accepted: Orders

## 2020-08-17 NOTE — Telephone Encounter (Signed)
    Patient calling requesting additional OxyCodone Patient declined appointment, states he needs short supply until her can have procedure Patient became upset when advised to contact Ortho   Please advise

## 2020-08-20 ENCOUNTER — Encounter: Payer: Self-pay | Admitting: Internal Medicine

## 2020-08-20 NOTE — Pre-Procedure Instructions (Signed)
[name]  08/20/2020    Your procedure is scheduled on Wed., Jan. 26, 2022 from 10:43AM-3:18PM  Report to [facility] [place] Entrance "A" at 8:40AM  Call this number if you have problems the morning of surgery:  [phone]   Remember:  Do not eat or drink after midnight on Jan. 25th    Take these medicines the morning of surgery with A SIP OF WATER: Atorvastatin (LIPITOR) Levothyroxine (SYNTHROID)  PredniSONE (DELTASONE) Tiotropium (SPIRIVA HANDIHALER)- bring with you day of surgery  If Needed: Carboxymethylcellul-Glycerin (LUBRICATING EYE DROPS OP) OxyCODONE-acetaminophen (PERCOCET/ROXICET) Albuterol Nebulizer  As of today, STOP taking all Aspirin (unless instructed by your doctor) and Other Aspirin containing products, Vitamins, Fish oils, and Herbal medications. Also stop all NSAIDS i.e. Advil, Ibuprofen, Motrin, Aleve, Anaprox, Naproxen, BC, Goody Powders, and all Supplements.   WHAT DO I DO ABOUT MY DIABETES MEDICATION?  [name] Take last dose of Empagliflozin (JARDIANCE) on 08/20/20  . Do not take MetFORMIN (GLUCOPHAGE) the morning of surgery.  . THE NIGHT BEFORE SURGERY, take _____30______ units of _____Insulin glargine (LANTUS)______insulin.  . The day of surgery, do not take other diabetes injectable Semaglutide (OZEMPIC)  o Check your blood sugar the morning of your surgery when you wake up and every 2 hours until you get to the Short Stay unit. . If your blood sugar is less than 70 mg/dL, you will need to treat for low blood sugar: o Do not take insulin. o Treat a low blood sugar (less than 70 mg/dL) with  cup of clear juice (cranberry or apple), 4 glucose tablets, OR glucose gel. Recheck blood sugar in 15 minutes after treatment (to make sure it is greater than 70 mg/dL). If your blood sugar is not greater than 70 mg/dL on recheck, call [phone] o  for further instructions.        . If your CBG is greater than 220 mg/dL, call the number above for  further instructions.  Reviewed and Endorsed by [facility] Patient Education Committee, August 2015  No Smoking of any kind, Tobacco/Vaping, or Alcohol products 24 hours prior to your procedure. If you use a Cpap at night, you may bring all equipment for your overnight stay.    Day of Surgery:  Do not wear jewelry.  Do not wear lotions, powders, colognes, or deodorant.  Do not shave 48 hours prior to surgery.  Men may shave face and neck.  Do not bring valuables to the hospital.  [facility] is not responsible for any belongings or valuables.  Contacts, dentures or bridgework may not be worn into surgery.  Special instructions:   - Preparing For Surgery  Before surgery, you can play an important role. Because skin is not sterile, your skin needs to be as free of germs as possible. You can reduce the number of germs on your skin by washing with CHG (chlorahexidine gluconate) Soap before surgery.  CHG is an antiseptic cleaner which kills germs and bonds with the skin to continue killing germs even after washing.    Oral Hygiene is also important to reduce your risk of infection.  Remember - BRUSH YOUR TEETH THE MORNING OF SURGERY WITH YOUR REGULAR TOOTHPASTE  Please do not use if you have an allergy to CHG or antibacterial soaps. If your skin becomes reddened/irritated stop using the CHG.  Do not shave (including legs and underarms) for at least 48 hours prior to first CHG shower. It is OK to shave your face.  Please follow these instructions carefully.   1. Shower the NIGHT BEFORE SURGERY and the MORNING OF SURGERY with CHG.   2. If you chose to wash your hair, wash your hair first as usual with your normal shampoo.  3. After you shampoo, rinse your hair and body thoroughly to remove the shampoo.  4. Use CHG as you would any other liquid soap. You can apply CHG directly to the skin and wash gently with a scrungie or a clean washcloth.   5. Apply the CHG Soap to your  body ONLY FROM THE NECK DOWN.  Do not use on open wounds or open sores. Avoid contact with your eyes, ears, mouth and genitals (private parts). Wash Face and genitals (private parts)  with your normal soap.  6. Wash thoroughly, paying special attention to the area where your surgery will be performed.  7. Thoroughly rinse your body with warm water from the neck down.  8. DO NOT shower/wash with your normal soap after using and rinsing off the CHG Soap.  9. Pat yourself dry with a CLEAN TOWEL.  10. Wear CLEAN PAJAMAS to bed the night before surgery, wear comfortable clothes the morning of surgery  11. Place CLEAN SHEETS on your bed the night of your first shower and DO NOT SLEEP WITH PETS.  Reminders: Do not apply any deodorants/lotions.  Please wear clean clothes to the hospital/surgery center.   Remember to brush your teeth WITH YOUR REGULAR TOOTHPASTE.  For patients admitted to the hospital, discharge time will be determined by your treatment team.  Patients discharged the day of surgery will not be allowed to drive home, and someone age 50 and over needs to stay with them for 24 hours.  Please read over the following fact sheets that you were given.

## 2020-08-20 NOTE — Progress Notes (Unsigned)
PERIOPERATIVE PRESCRIPTION FOR IMPLANTED CARDIAC DEVICE PROGRAMMING  Patient Information: Name:  [name]  DOB:  [birth year]  MRN:  [id number]  {TIP - You do not have to delete this tip  -  Copy the info from the staff message sent by the PAT staff  then press F2 here and paste the information using CTL - V on the next line :[id number]}   Planned Procedure: Left Femoral to Popliteal Bypass Grafting and Exclusion of Left Popliteal Artery Aneurysm  Surgeon: Dr. Sherald Hess  Date of Procedure: 08/22/20  Cautery will be used.  Position during surgery: Supine   Please send documentation back to:  [facility] (Fax # [phone])    Device Information:  Clinic EP Physician:  Sherryl Manges, MD   Device Type:  Pacemaker Manufacturer and Phone #:  Medtronic: [phone] Pacemaker Dependent?:  No. Date of Last Device Check:  06/29/20 Normal Device Function?:  Yes.    Electrophysiologist's Recommendations:   Have magnet available.  Provide continuous ECG monitoring when magnet is used or reprogramming is to be performed.   Procedure should not interfere with device function.  No device programming or magnet placement needed.  Per Device Clinic Standing Orders, Lenor Coffin, RN  4:05 PM 08/20/2020

## 2020-08-21 ENCOUNTER — Encounter (HOSPITAL_COMMUNITY)
Admission: RE | Admit: 2020-08-21 | Discharge: 2020-08-21 | Disposition: A | Discharge status: Home / Self Care | Payer: Medicare PPO | Source: Ambulatory Visit | Attending: Vascular Surgery | Admitting: Vascular Surgery

## 2020-08-21 ENCOUNTER — Encounter (HOSPITAL_COMMUNITY): Payer: Self-pay

## 2020-08-21 ENCOUNTER — Other Ambulatory Visit (HOSPITAL_COMMUNITY)
Admission: RE | Admit: 2020-08-21 | Discharge: 2020-08-21 | Disposition: A | Discharge status: Home / Self Care | Payer: Medicare PPO | Source: Ambulatory Visit | Attending: Vascular Surgery | Admitting: Vascular Surgery

## 2020-08-21 ENCOUNTER — Other Ambulatory Visit: Payer: Self-pay

## 2020-08-21 DIAGNOSIS — I5022 Chronic systolic (congestive) heart failure: Secondary | ICD-10-CM | POA: Diagnosis present

## 2020-08-21 DIAGNOSIS — J449 Chronic obstructive pulmonary disease, unspecified: Secondary | ICD-10-CM | POA: Insufficient documentation

## 2020-08-21 DIAGNOSIS — E1165 Type 2 diabetes mellitus with hyperglycemia: Secondary | ICD-10-CM | POA: Insufficient documentation

## 2020-08-21 DIAGNOSIS — E785 Hyperlipidemia, unspecified: Secondary | ICD-10-CM | POA: Insufficient documentation

## 2020-08-21 DIAGNOSIS — I10 Essential (primary) hypertension: Secondary | ICD-10-CM | POA: Insufficient documentation

## 2020-08-21 DIAGNOSIS — Z20822 Contact with and (suspected) exposure to covid-19: Secondary | ICD-10-CM | POA: Diagnosis present

## 2020-08-21 DIAGNOSIS — Z955 Presence of coronary angioplasty implant and graft: Secondary | ICD-10-CM | POA: Insufficient documentation

## 2020-08-21 DIAGNOSIS — Z9981 Dependence on supplemental oxygen: Secondary | ICD-10-CM | POA: Insufficient documentation

## 2020-08-21 DIAGNOSIS — I743 Embolism and thrombosis of arteries of the lower extremities: Secondary | ICD-10-CM | POA: Diagnosis not present

## 2020-08-21 DIAGNOSIS — Z01812 Encounter for preprocedural laboratory examination: Secondary | ICD-10-CM | POA: Diagnosis not present

## 2020-08-21 DIAGNOSIS — I2510 Atherosclerotic heart disease of native coronary artery without angina pectoris: Secondary | ICD-10-CM | POA: Insufficient documentation

## 2020-08-21 DIAGNOSIS — Z7984 Long term (current) use of oral hypoglycemic drugs: Secondary | ICD-10-CM | POA: Diagnosis not present

## 2020-08-21 DIAGNOSIS — J8410 Pulmonary fibrosis, unspecified: Secondary | ICD-10-CM | POA: Diagnosis not present

## 2020-08-21 DIAGNOSIS — E1151 Type 2 diabetes mellitus with diabetic peripheral angiopathy without gangrene: Secondary | ICD-10-CM | POA: Diagnosis present

## 2020-08-21 DIAGNOSIS — D62 Acute posthemorrhagic anemia: Secondary | ICD-10-CM | POA: Diagnosis present

## 2020-08-21 DIAGNOSIS — Z79890 Hormone replacement therapy (postmenopausal): Secondary | ICD-10-CM | POA: Diagnosis not present

## 2020-08-21 DIAGNOSIS — Z950 Presence of cardiac pacemaker: Secondary | ICD-10-CM | POA: Diagnosis not present

## 2020-08-21 DIAGNOSIS — I255 Ischemic cardiomyopathy: Secondary | ICD-10-CM | POA: Diagnosis present

## 2020-08-21 DIAGNOSIS — N179 Acute kidney failure, unspecified: Secondary | ICD-10-CM | POA: Diagnosis present

## 2020-08-21 DIAGNOSIS — Z794 Long term (current) use of insulin: Secondary | ICD-10-CM | POA: Diagnosis not present

## 2020-08-21 DIAGNOSIS — Z7952 Long term (current) use of systemic steroids: Secondary | ICD-10-CM | POA: Diagnosis not present

## 2020-08-21 DIAGNOSIS — M069 Rheumatoid arthritis, unspecified: Secondary | ICD-10-CM | POA: Diagnosis present

## 2020-08-21 DIAGNOSIS — J90 Pleural effusion, not elsewhere classified: Secondary | ICD-10-CM | POA: Diagnosis not present

## 2020-08-21 DIAGNOSIS — Z79899 Other long term (current) drug therapy: Secondary | ICD-10-CM | POA: Diagnosis not present

## 2020-08-21 DIAGNOSIS — G4733 Obstructive sleep apnea (adult) (pediatric): Secondary | ICD-10-CM | POA: Diagnosis not present

## 2020-08-21 DIAGNOSIS — E860 Dehydration: Secondary | ICD-10-CM | POA: Diagnosis not present

## 2020-08-21 DIAGNOSIS — E7800 Pure hypercholesterolemia, unspecified: Secondary | ICD-10-CM | POA: Diagnosis present

## 2020-08-21 DIAGNOSIS — N400 Enlarged prostate without lower urinary tract symptoms: Secondary | ICD-10-CM | POA: Diagnosis present

## 2020-08-21 DIAGNOSIS — D693 Immune thrombocytopenic purpura: Secondary | ICD-10-CM | POA: Diagnosis not present

## 2020-08-21 DIAGNOSIS — D6959 Other secondary thrombocytopenia: Secondary | ICD-10-CM | POA: Diagnosis present

## 2020-08-21 DIAGNOSIS — I110 Hypertensive heart disease with heart failure: Secondary | ICD-10-CM | POA: Diagnosis present

## 2020-08-21 DIAGNOSIS — I70222 Atherosclerosis of native arteries of extremities with rest pain, left leg: Secondary | ICD-10-CM | POA: Diagnosis present

## 2020-08-21 DIAGNOSIS — E039 Hypothyroidism, unspecified: Secondary | ICD-10-CM | POA: Diagnosis present

## 2020-08-21 DIAGNOSIS — Z87891 Personal history of nicotine dependence: Secondary | ICD-10-CM | POA: Diagnosis not present

## 2020-08-21 DIAGNOSIS — I724 Aneurysm of artery of lower extremity: Secondary | ICD-10-CM | POA: Diagnosis present

## 2020-08-21 LAB — CBC
HCT: 37.7 % — ABNORMAL LOW (ref 39.0–52.0)
Hemoglobin: 11.8 g/dL — ABNORMAL LOW (ref 13.0–17.0)
MCH: 26.4 pg (ref 26.0–34.0)
MCHC: 31.3 g/dL (ref 30.0–36.0)
MCV: 84.3 fL (ref 80.0–100.0)
Platelets: 75 10*3/uL — ABNORMAL LOW (ref 150–400)
RBC: 4.47 MIL/uL (ref 4.22–5.81)
RDW: 15.4 % (ref 11.5–15.5)
WBC: 7.3 10*3/uL (ref 4.0–10.5)
nRBC: 0 % (ref 0.0–0.2)

## 2020-08-21 LAB — COMPREHENSIVE METABOLIC PANEL
ALT: 18 U/L (ref 0–44)
AST: 21 U/L (ref 15–41)
Albumin: 3.9 g/dL (ref 3.5–5.0)
Alkaline Phosphatase: 62 U/L (ref 38–126)
Anion gap: 10 (ref 5–15)
BUN: 18 mg/dL (ref 8–23)
CO2: 23 mmol/L (ref 22–32)
Calcium: 9.4 mg/dL (ref 8.9–10.3)
Chloride: 106 mmol/L (ref 98–111)
Creatinine, Ser: 1.31 mg/dL — ABNORMAL HIGH (ref 0.61–1.24)
GFR, Estimated: 56 mL/min — ABNORMAL LOW (ref >60)
Glucose, Bld: 137 mg/dL — ABNORMAL HIGH (ref 70–99)
Potassium: 4.7 mmol/L (ref 3.5–5.1)
Sodium: 139 mmol/L (ref 135–145)
Total Bilirubin: 0.8 mg/dL (ref 0.3–1.2)
Total Protein: 6.5 g/dL (ref 6.5–8.1)

## 2020-08-21 LAB — BLOOD GAS, ARTERIAL
Acid-base deficit: 0.1 mmol/L (ref 0.0–2.0)
Bicarbonate: 24.8 mmol/L (ref 20.0–28.0)
Drawn by: 602861
FIO2: 21
O2 Saturation: 97.4 %
Patient temperature: 37
pCO2 arterial: 45.7 mmHg (ref 32.0–48.0)
pH, Arterial: 7.354 (ref 7.350–7.450)
pO2, Arterial: 99.1 mmHg (ref 83.0–108.0)

## 2020-08-21 LAB — URINALYSIS, ROUTINE W REFLEX MICROSCOPIC
Bacteria, UA: NONE SEEN
Bilirubin Urine: NEGATIVE
Glucose, UA: >=500 mg/dL — AB
Hgb urine dipstick: NEGATIVE
Ketones, ur: NEGATIVE mg/dL
Leukocytes,Ua: NEGATIVE
Nitrite: NEGATIVE
Protein, ur: NEGATIVE mg/dL
Specific Gravity, Urine: 1.022 (ref 1.005–1.030)
pH: 5 (ref 5.0–8.0)

## 2020-08-21 LAB — SURGICAL PCR SCREEN
MRSA, PCR: NEGATIVE
Staphylococcus aureus: POSITIVE — AB

## 2020-08-21 LAB — APTT: aPTT: 33 seconds (ref 24–36)

## 2020-08-21 LAB — PROTIME-INR
INR: 1.1 (ref 0.8–1.2)
Prothrombin Time: 13.4 seconds (ref 11.4–15.2)

## 2020-08-21 LAB — GLUCOSE, CAPILLARY: Glucose-Capillary: 146 mg/dL — ABNORMAL HIGH (ref 70–99)

## 2020-08-21 LAB — SARS CORONAVIRUS 2 (TAT 6-24 HRS): SARS Coronavirus 2: NEGATIVE

## 2020-08-21 LAB — HEMOGLOBIN A1C
Hgb A1c MFr Bld: 10.3 % — ABNORMAL HIGH (ref 4.8–5.6)
Mean Plasma Glucose: 248.91 mg/dL

## 2020-08-21 NOTE — Anesthesia Preprocedure Evaluation (Addendum)
Anesthesia Evaluation  Patient identified by MRN, date of birth, ID band Patient awake    Reviewed: Allergy & Precautions, NPO status , Patient's Chart, lab work & pertinent test results  Airway Mallampati: II  TM Distance: >3 FB Neck ROM: Full    Dental  (+) Dental Advisory Given, Edentulous Upper, Edentulous Lower   Pulmonary sleep apnea and Continuous Positive Airway Pressure Ventilation , COPD,  COPD inhaler and oxygen dependent, former smoker,  Pulmonary fibrosis    Pulmonary exam normal breath sounds clear to auscultation       Cardiovascular hypertension, Pt. on medications + CAD, + Cardiac Stents, + Peripheral Vascular Disease (status post aorto bi-iliac bypass for AAA in 2004) and +CHF  Normal cardiovascular exam+ dysrhythmias Atrial Fibrillation + pacemaker  Rhythm:Regular Rate:Normal     Neuro/Psych PSYCHIATRIC DISORDERS Anxiety Depression  Neuromuscular disease    GI/Hepatic negative GI ROS, Neg liver ROS,   Endo/Other  diabetes, Type 2, Oral Hypoglycemic Agents, Insulin DependentHypothyroidism   Renal/GU Renal InsufficiencyRenal disease     Musculoskeletal  (+) Arthritis , Rheumatoid disorders,    Abdominal   Peds  Hematology  (+) Blood dyscrasia (Thrombocytopenia), anemia ,   Anesthesia Other Findings Day of surgery medications reviewed with the patient.  Reproductive/Obstetrics                           Anesthesia Physical Anesthesia Plan  ASA: III  Anesthesia Plan: General   Post-op Pain Management:    Induction: Intravenous  PONV Risk Score and Plan: 2 and Dexamethasone and Ondansetron  Airway Management Planned: Oral ETT  Additional Equipment: Arterial line  Intra-op Plan:   Post-operative Plan: Extubation in OR  Informed Consent: I have reviewed the patients History and Physical, chart, labs and discussed the procedure including the risks, benefits and  alternatives for the proposed anesthesia with the patient or authorized representative who has indicated his/her understanding and acceptance.     Dental advisory given  Plan Discussed with: CRNA  Anesthesia Plan Comments: (2nd PIV after induction.  PAT note by Antionette Poles, PA-C: Follows with cardiology for hx of hypertension, hyperlipidemia, coronary artery disease status post right coronary stent in 2018. Cardiac cath 2/18 showed 30 LM, 50 LAD, aneurysmal RCA with 80 mid to distal; had DES to RCA at that time.Carotid dopplers 2/19 showed no significant stenosis. Admitted with sepsis 2/19 and also with atrial fibrillation, converted spontaneously to sinus. Echocardiogram August 2020 showed normal LV function. Hadpacemaker placed 12/20 due to history of syncope and pauses noted on monitor. He was previously on Eliquis but this was on hold due to recent history of ITP and hemoptysis. Last seen by primary cardiologist Dr. Jens Som on 05/30/2020, stable from cardiac standpoint that time.  Follows with hematologist Dr. 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 He was initially treated with prednisone 07/05/2019 through 10/23/2019. In July 2020 21-year platelet count of 37 with hemoptysis. He was restarted on treatment with prednisone. Preop labs 08/21/2020 show platelet count 75K, platelets have been stable 73-75K for the past 6 weeks.  History of PVD. He is status post aorto bi-iliac bypass for AAA in 2004 and left lower extremity embolectomy in 2002. that presents for evaluation of thrombosed left popliteal artery aneurysm.  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 He was initially seen by cardiologist Dr. Allyson Sabal on  08/07/2020 and he felt the patient would likely need bypass grafting referred the patient to Dr. Chestine Spore for definitive diagnosis and treatment.  Follows with pulmonology  history of COPD on 2 L home oxygen, OSA on CPAP. He is maintained on Spiriva and Striverdi.  IDDM2, uncontrolled, A1c 10.3 on preop labs.  Electrophysiology perioperative device recommendations per note 08/20/2020: Device Information:  Clinic EP Physician:  Sherryl Manges, MD   Device Type:  Pacemaker Manufacturer and Phone #:  Medtronic: [id number] Pacemaker Dependent?:  No. Date of Last Device Check:  06/29/20           Normal Device Function?:  Yes.    Electrophysiologist's Recommendations:  Have magnet available. Provide continuous ECG monitoring when magnet is used or reprogramming is to be performed.  Procedure should not interfere with device function.  No device programming or magnet placement needed.   EKG 05/30/2020: NSR with PACs, left bundle branch block, first-degree AV block. CHEST - 2 VIEW 06/08/2020:  COMPARISON:  March 15, 2020 chest radiograph and chest CT March 02, 2020  FINDINGS: A chronic small pleural effusion on the right is stable. There is calcification consistent with prior empyema on the right with areas of scarring and interstitial thickening. There is suspected loculated fluid along the right major fissure, stable. There is scarring in the left base. No new opacity is evident.  Heart size and pulmonary vascularity are normal. Pacemaker leads are attached to the right atrium and right ventricle. No adenopathy is appreciable by radiography. No bone lesions.  IMPRESSION: Evidence of prior empyema on the right with calcification. Scarring and interstitial thickening in this area is stable. Chronic small right pleural effusion. Mild left base atelectasis. No new opacity evident. Stable cardiac silhouette. Pacemaker leads attached to right atrium and right ventricle.  TTE 03/23/2019: 1. The left ventricle has normal systolic function with an ejection  fraction of 60-65%. The cavity size was normal. There is moderately  increased left  ventricular wall thickness. Left ventricular diastolic  Doppler parameters are indeterminate. There is  abnormal septal motion consistent with left bundle branch block.  2. The right ventricle has normal systolic function. The cavity was  normal. There is no increase in right ventricular wall thickness.  3. The aorta is normal unless otherwise noted.  4. The inferior vena cava was normal in size with <50% respiratory  variability.   Cath and PCI 09/10/2016: 1. 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  2. Moderate non-obstructive disease in the mid LAD 3. Elevated filling pressures 4. Successful PTCA/DES x 1 distal RCA.   Recommendations: He will need DAPT with ASA and Plavix for one year. Will monitor tonight and d/c home in the am. Will diurese with IV Lasix tonight given elevated filling pressures.   )       Anesthesia Quick Evaluation

## 2020-08-21 NOTE — Progress Notes (Addendum)
PCP - Dr. Okey Dupre  Cardiologist - Dr. Lindell Spar. 08/07/20 (E)- Dr. Allyson Sabal  Endocrine- Dr. Randa Evens, R.  Chest x-ray - 06/08/21 (E)  EKG - 06/09/21 (E)  Stress Test - Denies  ECHO - 03/23/2019 (E)  Cardiac Cath - 09/10/16 (E)  AICD-na PM- Yes- Medtronic- form faxed and email sent to Rep Annette Stable  Sleep Study - Yes- Positive CPAP - Yes-pt will bring day of surgery  LABS- 08/21/20: CBC, CMP, PT, PTT, ABG, T/S, PCR, UA, COVID  ASA- Denies  ERAS-No  HA1C- 08/21/20 Fasting Blood Sugar - 68-301, today 146 Checks Blood Sugar ___2__ times a day- Pt is currently taking prednisone, as well.  Anesthesia- Yes- medical history- PA Fayrene Fearing made aware  Pt denies having chest pain, sob, or fever at this time. All instructions explained to the pt, with a verbal understanding of the material. Pt agrees to go over the instructions while at home for a better understanding. Pt also instructed to self quarantine after being tested for COVID-19. The opportunity to ask questions was provided.   Coronavirus Screening  Have you experienced the following symptoms:  Cough yes/no: No Fever (>100.33F)  yes/no: No Runny nose yes/no: No Sore throat yes/no: No Difficulty breathing/shortness of breath  yes/no: No  Have you or a family member traveled in the last 14 days and where? yes/no: No   If the patient indicates "YES" to the above questions, their PAT will be rescheduled to limit the exposure to others and, the surgeon will be notified. THE PATIENT WILL NEED TO BE ASYMPTOMATIC FOR 14 DAYS.   If the patient is not experiencing any of these symptoms, the PAT nurse will instruct them to NOT bring anyone with them to their appointment since they may have these symptoms or traveled as well.   Please remind your patients and families that hospital visitation restrictions are in effect and the importance of the restrictions.

## 2020-08-21 NOTE — Progress Notes (Signed)
Anesthesia Chart Review:  Follows with cardiology for hx of hypertension, hyperlipidemia, coronary artery disease status post right coronary stent in 2018. Cardiac cath 2/18 showed 30 LM, 50 LAD, aneurysmal RCA with 80 mid to distal; had DES to RCA at that time.Carotid dopplers 2/19 showed no significant stenosis. Admitted with sepsis 2/19 and also with atrial fibrillation, converted spontaneously to sinus. Echocardiogram August 2020 showed normal LV function. Hadpacemaker placed 12/20 due to history of syncope and pauses noted on monitor. He was previously on Eliquis but this was on hold due to recent history of ITP and hemoptysis. Last seen by primary cardiologist Dr. Jens Som on 05/30/2020, stable from cardiac standpoint that time.  Follows with hematologist Dr. 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 He was initially treated with prednisone 07/05/2019 through 10/23/2019. In July 2020 21-year platelet count of 37 with hemoptysis. He was restarted on treatment with prednisone. Preop labs 08/21/2020 show platelet count 75K, platelets have been stable 73-75K for the past 6 weeks.  History of PVD. He is status post aorto bi-iliac bypass for AAA in 2004 and left lower extremity embolectomy in 2002. that presents for evaluation of thrombosed left popliteal artery aneurysm.  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 He was initially seen by cardiologist Dr. Allyson Sabal on 08/07/2020 and he felt the patient would likely need bypass grafting referred the patient to Dr. Chestine Spore for definitive diagnosis and treatment.  Follows with pulmonology history of COPD on 2 L home oxygen, OSA on CPAP. He is maintained on Spiriva and Striverdi.  IDDM2, uncontrolled, A1c 10.3 on preop labs.  Electrophysiology perioperative device recommendations per note 08/20/2020: Device Information:  Clinic EP Physician:   Sherryl Manges, MD   Device Type:  Pacemaker Manufacturer and Phone #:  Medtronic: [phone] Pacemaker Dependent?:  No. Date of Last Device Check:  06/29/20           Normal Device Function?:  Yes.    Electrophysiologist's Recommendations:   Have magnet available.  Provide continuous ECG monitoring when magnet is used or reprogramming is to be performed.   Procedure should not interfere with device function.  No device programming or magnet placement needed.   EKG 05/30/2020: NSR with PACs, left bundle branch block, first-degree AV block. CHEST - 2 VIEW 06/08/2020:  COMPARISON:  March 15, 2020 chest radiograph and chest CT March 02, 2020  FINDINGS: A chronic small pleural effusion on the right is stable. There is calcification consistent with prior empyema on the right with areas of scarring and interstitial thickening. There is suspected loculated fluid along the right major fissure, stable. There is scarring in the left base. No new opacity is evident.  Heart size and pulmonary vascularity are normal. Pacemaker leads are attached to the right atrium and right ventricle. No adenopathy is appreciable by radiography. No bone lesions.  IMPRESSION: Evidence of prior empyema on the right with calcification. Scarring and interstitial thickening in this area is stable. Chronic small right pleural effusion. Mild left base atelectasis. No new opacity evident. Stable cardiac silhouette. Pacemaker leads attached to right atrium and right ventricle.  TTE 03/23/2019: 1. The left ventricle has normal systolic function with an ejection  fraction of 60-65%. The cavity size was normal. There is moderately  increased left ventricular wall thickness. Left ventricular diastolic  Doppler parameters are indeterminate. There is  abnormal septal motion consistent with left bundle  branch block.  2. The right ventricle has normal systolic function. The cavity was  normal. There is no  increase in right ventricular wall thickness.  3. The aorta is normal unless otherwise noted.  4. The inferior vena cava was normal in size with <50% respiratory  variability.   Cath and PCI 09/10/2016: 1. 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  2. Moderate non-obstructive disease in the mid LAD 3. Elevated filling pressures 4. Successful PTCA/DES x 1 distal RCA.   Recommendations: He will need DAPT with ASA and Plavix for one year. Will monitor tonight and d/c home in the am. Will diurese with IV Lasix tonight given elevated filling pressures.     Zannie Cove [facility] Short Stay Center/Anesthesiology Phone [phone] 08/21/2020 1:20 PM

## 2020-08-22 ENCOUNTER — Inpatient Hospital Stay (HOSPITAL_COMMUNITY)
Admission: RE | Admit: 2020-08-22 | Discharge: 2020-08-27 | DRG: 253 | Disposition: A | Discharge status: Home Health | Payer: Medicare PPO | Attending: Vascular Surgery | Admitting: Vascular Surgery

## 2020-08-22 ENCOUNTER — Encounter (HOSPITAL_COMMUNITY)
Admission: RE | Disposition: A | Discharge status: Home / Self Care | Payer: Self-pay | Source: Home / Self Care | Attending: Vascular Surgery

## 2020-08-22 ENCOUNTER — Other Ambulatory Visit: Payer: Self-pay

## 2020-08-22 ENCOUNTER — Inpatient Hospital Stay (HOSPITAL_COMMUNITY): Payer: Medicare PPO | Admitting: Anesthesiology

## 2020-08-22 ENCOUNTER — Encounter (HOSPITAL_COMMUNITY): Payer: Self-pay | Admitting: Vascular Surgery

## 2020-08-22 ENCOUNTER — Inpatient Hospital Stay (HOSPITAL_COMMUNITY): Payer: Medicare PPO | Admitting: Physician Assistant

## 2020-08-22 DIAGNOSIS — I110 Hypertensive heart disease with heart failure: Secondary | ICD-10-CM | POA: Diagnosis present

## 2020-08-22 DIAGNOSIS — Z9981 Dependence on supplemental oxygen: Secondary | ICD-10-CM | POA: Diagnosis not present

## 2020-08-22 DIAGNOSIS — E1151 Type 2 diabetes mellitus with diabetic peripheral angiopathy without gangrene: Secondary | ICD-10-CM | POA: Diagnosis present

## 2020-08-22 DIAGNOSIS — N179 Acute kidney failure, unspecified: Secondary | ICD-10-CM | POA: Diagnosis present

## 2020-08-22 DIAGNOSIS — F419 Anxiety disorder, unspecified: Secondary | ICD-10-CM | POA: Diagnosis present

## 2020-08-22 DIAGNOSIS — Z7984 Long term (current) use of oral hypoglycemic drugs: Secondary | ICD-10-CM | POA: Diagnosis not present

## 2020-08-22 DIAGNOSIS — D6959 Other secondary thrombocytopenia: Secondary | ICD-10-CM | POA: Diagnosis present

## 2020-08-22 DIAGNOSIS — Z79899 Other long term (current) drug therapy: Secondary | ICD-10-CM

## 2020-08-22 DIAGNOSIS — Z7952 Long term (current) use of systemic steroids: Secondary | ICD-10-CM | POA: Diagnosis not present

## 2020-08-22 DIAGNOSIS — I2510 Atherosclerotic heart disease of native coronary artery without angina pectoris: Secondary | ICD-10-CM | POA: Diagnosis present

## 2020-08-22 DIAGNOSIS — I724 Aneurysm of artery of lower extremity: Secondary | ICD-10-CM | POA: Diagnosis present

## 2020-08-22 DIAGNOSIS — E7800 Pure hypercholesterolemia, unspecified: Secondary | ICD-10-CM | POA: Diagnosis present

## 2020-08-22 DIAGNOSIS — Z794 Long term (current) use of insulin: Secondary | ICD-10-CM | POA: Diagnosis not present

## 2020-08-22 DIAGNOSIS — D62 Acute posthemorrhagic anemia: Secondary | ICD-10-CM | POA: Diagnosis present

## 2020-08-22 DIAGNOSIS — J8410 Pulmonary fibrosis, unspecified: Secondary | ICD-10-CM | POA: Diagnosis present

## 2020-08-22 DIAGNOSIS — Z20822 Contact with and (suspected) exposure to covid-19: Secondary | ICD-10-CM | POA: Diagnosis present

## 2020-08-22 DIAGNOSIS — Z9049 Acquired absence of other specified parts of digestive tract: Secondary | ICD-10-CM

## 2020-08-22 DIAGNOSIS — I5022 Chronic systolic (congestive) heart failure: Secondary | ICD-10-CM | POA: Diagnosis present

## 2020-08-22 DIAGNOSIS — M069 Rheumatoid arthritis, unspecified: Secondary | ICD-10-CM | POA: Diagnosis present

## 2020-08-22 DIAGNOSIS — E785 Hyperlipidemia, unspecified: Secondary | ICD-10-CM | POA: Diagnosis present

## 2020-08-22 DIAGNOSIS — E039 Hypothyroidism, unspecified: Secondary | ICD-10-CM | POA: Diagnosis present

## 2020-08-22 DIAGNOSIS — N400 Enlarged prostate without lower urinary tract symptoms: Secondary | ICD-10-CM | POA: Diagnosis present

## 2020-08-22 DIAGNOSIS — Z79890 Hormone replacement therapy (postmenopausal): Secondary | ICD-10-CM

## 2020-08-22 DIAGNOSIS — I70222 Atherosclerosis of native arteries of extremities with rest pain, left leg: Secondary | ICD-10-CM | POA: Diagnosis present

## 2020-08-22 DIAGNOSIS — I743 Embolism and thrombosis of arteries of the lower extremities: Secondary | ICD-10-CM | POA: Diagnosis not present

## 2020-08-22 DIAGNOSIS — I255 Ischemic cardiomyopathy: Secondary | ICD-10-CM | POA: Diagnosis present

## 2020-08-22 DIAGNOSIS — Z87891 Personal history of nicotine dependence: Secondary | ICD-10-CM

## 2020-08-22 DIAGNOSIS — J449 Chronic obstructive pulmonary disease, unspecified: Secondary | ICD-10-CM | POA: Diagnosis present

## 2020-08-22 DIAGNOSIS — Z955 Presence of coronary angioplasty implant and graft: Secondary | ICD-10-CM

## 2020-08-22 DIAGNOSIS — R0600 Dyspnea, unspecified: Secondary | ICD-10-CM

## 2020-08-22 HISTORY — PX: FEMORAL-POPLITEAL BYPASS GRAFT: SHX937

## 2020-08-22 HISTORY — PX: FALSE ANEURYSM REPAIR: SHX5152

## 2020-08-22 LAB — GLUCOSE, CAPILLARY
Glucose-Capillary: 75 mg/dL (ref 70–99)
Glucose-Capillary: 71 mg/dL (ref 70–99)
Glucose-Capillary: 89 mg/dL (ref 70–99)
Glucose-Capillary: 86 mg/dL (ref 70–99)
Glucose-Capillary: 131 mg/dL — ABNORMAL HIGH (ref 70–99)
Glucose-Capillary: 124 mg/dL — ABNORMAL HIGH (ref 70–99)
Glucose-Capillary: 220 mg/dL — ABNORMAL HIGH (ref 70–99)

## 2020-08-22 LAB — CBC
HCT: 29.3 % — ABNORMAL LOW (ref 39.0–52.0)
Hemoglobin: 8.7 g/dL — ABNORMAL LOW (ref 13.0–17.0)
MCH: 25.8 pg — ABNORMAL LOW (ref 26.0–34.0)
MCHC: 29.7 g/dL — ABNORMAL LOW (ref 30.0–36.0)
MCV: 86.9 fL (ref 80.0–100.0)
Platelets: 62 10*3/uL — ABNORMAL LOW (ref 150–400)
RBC: 3.37 MIL/uL — ABNORMAL LOW (ref 4.22–5.81)
RDW: 15.5 % (ref 11.5–15.5)
WBC: 10.6 10*3/uL — ABNORMAL HIGH (ref 4.0–10.5)
nRBC: 0 % (ref 0.0–0.2)

## 2020-08-22 SURGERY — BYPASS GRAFT FEMORAL-POPLITEAL ARTERY
Anesthesia: General | Site: Leg Upper | Laterality: Left

## 2020-08-22 MED ORDER — MAGNESIUM SULFATE 2 GM/50ML IV SOLN: 2.0000 g | Freq: Every day | INTRAVENOUS | Status: DC | PRN

## 2020-08-22 MED ORDER — OXYCODONE-ACETAMINOPHEN 5-325 MG PO TABS
1.0000 | ORAL_TABLET | ORAL | Status: DC | PRN
  Administered 2020-08-23 (×2): 2 via ORAL
  Administered 2020-08-23 – 2020-08-24 (×2): 1 via ORAL
  Administered 2020-08-24: 2 via ORAL
  Administered 2020-08-24: 1 via ORAL
  Administered 2020-08-24: 2 via ORAL
  Administered 2020-08-25 (×2): 1 via ORAL
  Administered 2020-08-25: 2 via ORAL
  Administered 2020-08-26 (×2): 1 via ORAL
  Administered 2020-08-26 (×2): 2 via ORAL
  Administered 2020-08-26 – 2020-08-27 (×2): 1 via ORAL
  Filled 2020-08-22: qty 2
  Filled 2020-08-22 (×4): qty 1
  Filled 2020-08-22: qty 2
  Filled 2020-08-22: qty 1
  Filled 2020-08-22 (×3): qty 2
  Filled 2020-08-22 (×5): qty 1
  Filled 2020-08-22 (×2): qty 2

## 2020-08-22 MED ORDER — DEXAMETHASONE SODIUM PHOSPHATE 10 MG/ML IJ SOLN
INTRAMUSCULAR | Status: DC | PRN
  Administered 2020-08-22: 10 mg via INTRAVENOUS

## 2020-08-22 MED ORDER — GUAIFENESIN-DM 100-10 MG/5ML PO SYRP: 15.0000 mL | ORAL_SOLUTION | ORAL | Status: DC | PRN

## 2020-08-22 MED ORDER — LACTATED RINGERS IV SOLN: INTRAVENOUS | Status: DC

## 2020-08-22 MED ORDER — PROTAMINE SULFATE 10 MG/ML IV SOLN
INTRAVENOUS | Status: DC | PRN
  Administered 2020-08-22: 50 mg via INTRAVENOUS

## 2020-08-22 MED ORDER — EPHEDRINE SULFATE-NACL 50-0.9 MG/10ML-% IV SOSY
PREFILLED_SYRINGE | INTRAVENOUS | Status: DC | PRN
  Administered 2020-08-22: 10 mg via INTRAVENOUS

## 2020-08-22 MED ORDER — ALBUTEROL SULFATE (2.5 MG/3ML) 0.083% IN NEBU
2.5000 mg | INHALATION_SOLUTION | Freq: Four times a day (QID) | RESPIRATORY_TRACT | Status: DC | PRN
  Administered 2020-08-25: 2.5 mg via RESPIRATORY_TRACT
  Filled 2020-08-22: qty 3

## 2020-08-22 MED ORDER — ASPIRIN EC 81 MG PO TBEC
81.0000 mg | DELAYED_RELEASE_TABLET | Freq: Every day | ORAL | Status: DC
  Administered 2020-08-23 – 2020-08-27 (×5): 81 mg via ORAL
  Filled 2020-08-22 (×5): qty 1

## 2020-08-22 MED ORDER — LOSARTAN POTASSIUM 50 MG PO TABS: 50.0000 mg | ORAL_TABLET | Freq: Every day | ORAL | Status: DC

## 2020-08-22 MED ORDER — DEXAMETHASONE SODIUM PHOSPHATE 10 MG/ML IJ SOLN
INTRAMUSCULAR | Status: AC
  Filled 2020-08-22: qty 1

## 2020-08-22 MED ORDER — ALPRAZOLAM 0.5 MG PO TABS
0.5000 mg | ORAL_TABLET | Freq: Every day | ORAL | Status: DC
  Administered 2020-08-22 – 2020-08-26 (×5): 0.5 mg via ORAL
  Filled 2020-08-22 (×5): qty 1

## 2020-08-22 MED ORDER — MORPHINE SULFATE (PF) 2 MG/ML IV SOLN
INTRAVENOUS | Status: AC
  Filled 2020-08-22: qty 1

## 2020-08-22 MED ORDER — VASOPRESSIN 20 UNIT/ML IV SOLN
INTRAVENOUS | Status: AC
  Filled 2020-08-22: qty 1

## 2020-08-22 MED ORDER — ONDANSETRON HCL 4 MG/2ML IJ SOLN
INTRAMUSCULAR | Status: AC
  Filled 2020-08-22: qty 2

## 2020-08-22 MED ORDER — ACETAMINOPHEN 325 MG RE SUPP
325.0000 mg | RECTAL | Status: DC | PRN
  Filled 2020-08-22: qty 2

## 2020-08-22 MED ORDER — DOCUSATE SODIUM 100 MG PO CAPS
100.0000 mg | ORAL_CAPSULE | Freq: Every day | ORAL | Status: DC
  Administered 2020-08-23 – 2020-08-27 (×5): 100 mg via ORAL
  Filled 2020-08-22 (×5): qty 1

## 2020-08-22 MED ORDER — 0.9 % SODIUM CHLORIDE (POUR BTL) OPTIME
TOPICAL | Status: DC | PRN
  Administered 2020-08-22: 2000 mL

## 2020-08-22 MED ORDER — INSULIN ASPART 100 UNIT/ML ~~LOC~~ SOLN
0.0000 [IU] | Freq: Three times a day (TID) | SUBCUTANEOUS | Status: DC
  Administered 2020-08-23: 3 [IU] via SUBCUTANEOUS
  Administered 2020-08-23: 5 [IU] via SUBCUTANEOUS
  Administered 2020-08-23 – 2020-08-25 (×3): 3 [IU] via SUBCUTANEOUS
  Administered 2020-08-26: 8 [IU] via SUBCUTANEOUS

## 2020-08-22 MED ORDER — PHENYLEPHRINE HCL-NACL 10-0.9 MG/250ML-% IV SOLN
INTRAVENOUS | Status: DC | PRN
  Administered 2020-08-22: 25 ug/min via INTRAVENOUS

## 2020-08-22 MED ORDER — PHENOL 1.4 % MT LIQD: 1.0000 | OROMUCOSAL | Status: DC | PRN

## 2020-08-22 MED ORDER — TIOTROPIUM BROMIDE MONOHYDRATE 18 MCG IN CAPS: 18.0000 ug | ORAL_CAPSULE | Freq: Every day | RESPIRATORY_TRACT | Status: DC

## 2020-08-22 MED ORDER — LABETALOL HCL 5 MG/ML IV SOLN
10.0000 mg | INTRAVENOUS | Status: DC | PRN
  Filled 2020-08-22: qty 4

## 2020-08-22 MED ORDER — CHLORHEXIDINE GLUCONATE 0.12 % MT SOLN
15.0000 mL | Freq: Once | OROMUCOSAL | Status: AC
  Administered 2020-08-22: 15 mL via OROMUCOSAL
  Filled 2020-08-22: qty 15

## 2020-08-22 MED ORDER — PHENYLEPHRINE 40 MCG/ML (10ML) SYRINGE FOR IV PUSH (FOR BLOOD PRESSURE SUPPORT)
PREFILLED_SYRINGE | INTRAVENOUS | Status: DC | PRN
  Administered 2020-08-22 (×5): 80 ug via INTRAVENOUS
  Administered 2020-08-22: 240 ug via INTRAVENOUS

## 2020-08-22 MED ORDER — ACETAMINOPHEN 500 MG PO TABS
500.0000 mg | ORAL_TABLET | Freq: Once | ORAL | Status: AC
  Administered 2020-08-22: 500 mg via ORAL

## 2020-08-22 MED ORDER — UMECLIDINIUM BROMIDE 62.5 MCG/INH IN AEPB
1.0000 | INHALATION_SPRAY | Freq: Every day | RESPIRATORY_TRACT | Status: DC
  Administered 2020-08-23 – 2020-08-27 (×4): 1 via RESPIRATORY_TRACT
  Filled 2020-08-22: qty 7

## 2020-08-22 MED ORDER — HEPARIN SODIUM (PORCINE) 1000 UNIT/ML IJ SOLN
INTRAMUSCULAR | Status: AC
  Filled 2020-08-22: qty 1

## 2020-08-22 MED ORDER — ALUM & MAG HYDROXIDE-SIMETH 200-200-20 MG/5ML PO SUSP: 15.0000 mL | ORAL | Status: DC | PRN

## 2020-08-22 MED ORDER — HEPARIN SODIUM (PORCINE) 1000 UNIT/ML IJ SOLN
INTRAMUSCULAR | Status: DC | PRN
  Administered 2020-08-22: 2000 [IU] via INTRAVENOUS
  Administered 2020-08-22: 10000 [IU] via INTRAVENOUS

## 2020-08-22 MED ORDER — PROPOFOL 10 MG/ML IV BOLUS
INTRAVENOUS | Status: DC | PRN
  Administered 2020-08-22: 100 mg via INTRAVENOUS

## 2020-08-22 MED ORDER — ORAL CARE MOUTH RINSE: 15.0000 mL | Freq: Once | OROMUCOSAL | Status: AC

## 2020-08-22 MED ORDER — HEPARIN SODIUM (PORCINE) 5000 UNIT/ML IJ SOLN
5000.0000 [IU] | Freq: Three times a day (TID) | INTRAMUSCULAR | Status: DC
  Administered 2020-08-23 – 2020-08-27 (×12): 5000 [IU] via SUBCUTANEOUS
  Filled 2020-08-22 (×12): qty 1

## 2020-08-22 MED ORDER — MORPHINE SULFATE (PF) 2 MG/ML IV SOLN
2.0000 mg | INTRAVENOUS | Status: DC | PRN
  Administered 2020-08-22 – 2020-08-26 (×11): 2 mg via INTRAVENOUS
  Filled 2020-08-22 (×11): qty 1

## 2020-08-22 MED ORDER — SODIUM CHLORIDE 0.9 % IV SOLN: INTRAVENOUS | Status: AC

## 2020-08-22 MED ORDER — PHENYLEPHRINE 40 MCG/ML (10ML) SYRINGE FOR IV PUSH (FOR BLOOD PRESSURE SUPPORT)
PREFILLED_SYRINGE | INTRAVENOUS | Status: AC
  Filled 2020-08-22: qty 10

## 2020-08-22 MED ORDER — FENTANYL CITRATE (PF) 100 MCG/2ML IJ SOLN
INTRAMUSCULAR | Status: AC
  Filled 2020-08-22: qty 2

## 2020-08-22 MED ORDER — INSULIN GLARGINE 100 UNIT/ML ~~LOC~~ SOLN
60.0000 [IU] | Freq: Every day | SUBCUTANEOUS | Status: DC
  Administered 2020-08-22 – 2020-08-23 (×2): 60 [IU] via SUBCUTANEOUS
  Filled 2020-08-22 (×3): qty 0.6

## 2020-08-22 MED ORDER — CHLORHEXIDINE GLUCONATE CLOTH 2 % EX PADS: 6.0000 | MEDICATED_PAD | Freq: Once | CUTANEOUS | Status: DC

## 2020-08-22 MED ORDER — PROPOFOL 10 MG/ML IV BOLUS
INTRAVENOUS | Status: AC
  Filled 2020-08-22: qty 20

## 2020-08-22 MED ORDER — ONDANSETRON HCL 4 MG/2ML IJ SOLN: 4.0000 mg | Freq: Four times a day (QID) | INTRAMUSCULAR | Status: DC | PRN

## 2020-08-22 MED ORDER — FENTANYL CITRATE (PF) 250 MCG/5ML IJ SOLN
INTRAMUSCULAR | Status: DC | PRN
  Administered 2020-08-22 (×2): 50 ug via INTRAVENOUS
  Administered 2020-08-22: 100 ug via INTRAVENOUS
  Administered 2020-08-22: 50 ug via INTRAVENOUS

## 2020-08-22 MED ORDER — ONDANSETRON HCL 4 MG/2ML IJ SOLN
INTRAMUSCULAR | Status: DC | PRN
  Administered 2020-08-22: 4 mg via INTRAVENOUS

## 2020-08-22 MED ORDER — LIDOCAINE 2% (20 MG/ML) 5 ML SYRINGE
INTRAMUSCULAR | Status: DC | PRN
  Administered 2020-08-22: 100 mg via INTRAVENOUS

## 2020-08-22 MED ORDER — CEFAZOLIN SODIUM-DEXTROSE 2-4 GM/100ML-% IV SOLN
INTRAVENOUS | Status: AC
  Filled 2020-08-22: qty 100

## 2020-08-22 MED ORDER — ROCURONIUM BROMIDE 10 MG/ML (PF) SYRINGE
PREFILLED_SYRINGE | INTRAVENOUS | Status: DC | PRN
  Administered 2020-08-22: 30 mg via INTRAVENOUS
  Administered 2020-08-22: 70 mg via INTRAVENOUS
  Administered 2020-08-22: 20 mg via INTRAVENOUS
  Administered 2020-08-22: 10 mg via INTRAVENOUS

## 2020-08-22 MED ORDER — ONDANSETRON HCL 4 MG/2ML IJ SOLN: 4.0000 mg | Freq: Once | INTRAMUSCULAR | Status: DC | PRN

## 2020-08-22 MED ORDER — PROTAMINE SULFATE 10 MG/ML IV SOLN
INTRAVENOUS | Status: AC
  Filled 2020-08-22: qty 5

## 2020-08-22 MED ORDER — ARFORMOTEROL TARTRATE 15 MCG/2ML IN NEBU
15.0000 ug | INHALATION_SOLUTION | Freq: Two times a day (BID) | RESPIRATORY_TRACT | Status: DC
  Administered 2020-08-23 – 2020-08-27 (×8): 15 ug via RESPIRATORY_TRACT
  Filled 2020-08-22 (×8): qty 2

## 2020-08-22 MED ORDER — PANTOPRAZOLE SODIUM 40 MG PO TBEC
40.0000 mg | DELAYED_RELEASE_TABLET | Freq: Every day | ORAL | Status: DC
  Administered 2020-08-22 – 2020-08-27 (×6): 40 mg via ORAL
  Filled 2020-08-22 (×6): qty 1

## 2020-08-22 MED ORDER — LACTATED RINGERS IV SOLN: INTRAVENOUS | Status: DC | PRN

## 2020-08-22 MED ORDER — TAMSULOSIN HCL 0.4 MG PO CAPS
0.4000 mg | ORAL_CAPSULE | Freq: Every day | ORAL | Status: DC
  Administered 2020-08-22 – 2020-08-26 (×5): 0.4 mg via ORAL
  Filled 2020-08-22 (×5): qty 1

## 2020-08-22 MED ORDER — SODIUM CHLORIDE 0.9 % IV SOLN
INTRAVENOUS | Status: DC | PRN
  Administered 2020-08-22: 500 mL

## 2020-08-22 MED ORDER — ROCURONIUM BROMIDE 10 MG/ML (PF) SYRINGE
PREFILLED_SYRINGE | INTRAVENOUS | Status: AC
  Filled 2020-08-22: qty 30

## 2020-08-22 MED ORDER — HEMOSTATIC AGENTS (NO CHARGE) OPTIME
TOPICAL | Status: DC | PRN
  Administered 2020-08-22 (×2): 1 via TOPICAL

## 2020-08-22 MED ORDER — METOPROLOL TARTRATE 5 MG/5ML IV SOLN: 2.0000 mg | INTRAVENOUS | Status: DC | PRN

## 2020-08-22 MED ORDER — ALBUMIN HUMAN 5 % IV SOLN: INTRAVENOUS | Status: DC | PRN

## 2020-08-22 MED ORDER — ATORVASTATIN CALCIUM 40 MG PO TABS
40.0000 mg | ORAL_TABLET | Freq: Every day | ORAL | Status: DC
  Administered 2020-08-23: 40 mg via ORAL
  Filled 2020-08-22: qty 1

## 2020-08-22 MED ORDER — LEVOTHYROXINE SODIUM 150 MCG PO TABS
150.0000 ug | ORAL_TABLET | Freq: Every day | ORAL | Status: DC
  Administered 2020-08-23 – 2020-08-27 (×5): 150 ug via ORAL
  Filled 2020-08-22: qty 1
  Filled 2020-08-22 (×2): qty 2
  Filled 2020-08-22 (×2): qty 1
  Filled 2020-08-22 (×2): qty 2
  Filled 2020-08-22: qty 1
  Filled 2020-08-22: qty 2
  Filled 2020-08-22: qty 1

## 2020-08-22 MED ORDER — CEFAZOLIN SODIUM-DEXTROSE 2-4 GM/100ML-% IV SOLN
2.0000 g | INTRAVENOUS | Status: AC
  Administered 2020-08-22: 2 g via INTRAVENOUS
  Filled 2020-08-22: qty 100

## 2020-08-22 MED ORDER — VASOPRESSIN 20 UNIT/ML IV SOLN
INTRAVENOUS | Status: DC | PRN
  Administered 2020-08-22: 2 [IU] via INTRAVENOUS
  Administered 2020-08-22: 1 [IU] via INTRAVENOUS

## 2020-08-22 MED ORDER — POTASSIUM CHLORIDE CRYS ER 20 MEQ PO TBCR: 20.0000 meq | EXTENDED_RELEASE_TABLET | Freq: Every day | ORAL | Status: DC | PRN

## 2020-08-22 MED ORDER — ACETAMINOPHEN 500 MG PO TABS
1000.0000 mg | ORAL_TABLET | Freq: Once | ORAL | Status: DC
  Filled 2020-08-22: qty 2

## 2020-08-22 MED ORDER — PREDNISONE 5 MG PO TABS
5.0000 mg | ORAL_TABLET | Freq: Every day | ORAL | Status: DC
Start: 2020-08-23 — End: 2020-08-27
  Administered 2020-08-23 – 2020-08-27 (×5): 5 mg via ORAL
  Filled 2020-08-22 (×5): qty 1

## 2020-08-22 MED ORDER — SODIUM CHLORIDE 0.9 % IV SOLN: 500.0000 mL | Freq: Once | INTRAVENOUS | Status: DC | PRN

## 2020-08-22 MED ORDER — FENTANYL CITRATE (PF) 250 MCG/5ML IJ SOLN
INTRAMUSCULAR | Status: AC
  Filled 2020-08-22: qty 5

## 2020-08-22 MED ORDER — FENTANYL CITRATE (PF) 100 MCG/2ML IJ SOLN
25.0000 ug | INTRAMUSCULAR | Status: DC | PRN
  Administered 2020-08-22 (×4): 25 ug via INTRAVENOUS

## 2020-08-22 MED ORDER — SODIUM CHLORIDE 0.9 % IV SOLN: INTRAVENOUS | Status: DC

## 2020-08-22 MED ORDER — ALBUTEROL SULFATE HFA 108 (90 BASE) MCG/ACT IN AERS
INHALATION_SPRAY | RESPIRATORY_TRACT | Status: DC | PRN
  Administered 2020-08-22 (×2): 4 via RESPIRATORY_TRACT

## 2020-08-22 MED ORDER — CEFAZOLIN SODIUM-DEXTROSE 2-4 GM/100ML-% IV SOLN
2.0000 g | Freq: Three times a day (TID) | INTRAVENOUS | Status: AC
  Administered 2020-08-22 – 2020-08-23 (×2): 2 g via INTRAVENOUS
  Filled 2020-08-22: qty 100

## 2020-08-22 MED ORDER — SUGAMMADEX SODIUM 200 MG/2ML IV SOLN
INTRAVENOUS | Status: DC | PRN
  Administered 2020-08-22: 200 mg via INTRAVENOUS

## 2020-08-22 MED ORDER — ACETAMINOPHEN 325 MG PO TABS: 325.0000 mg | ORAL_TABLET | ORAL | Status: DC | PRN

## 2020-08-22 MED ORDER — HYDRALAZINE HCL 20 MG/ML IJ SOLN
5.0000 mg | INTRAMUSCULAR | Status: DC | PRN
  Filled 2020-08-22: qty 0.25

## 2020-08-22 MED ORDER — SODIUM CHLORIDE 0.9 % IV SOLN
INTRAVENOUS | Status: AC
  Filled 2020-08-22: qty 1.2

## 2020-08-22 MED ORDER — LIDOCAINE 2% (20 MG/ML) 5 ML SYRINGE
INTRAMUSCULAR | Status: AC
  Filled 2020-08-22: qty 10

## 2020-08-22 SURGICAL SUPPLY — 73 items
ADH SKN CLS APL DERMABOND .7 (GAUZE/BANDAGES/DRESSINGS) ×8
AGENT HMST SPONGE THK3/8 (HEMOSTASIS)
BANDAGE ESMARK 6X9 LF (GAUZE/BANDAGES/DRESSINGS) ×1 IMPLANT
BLADE CLIPPER SURG (BLADE) ×4 IMPLANT
BNDG CMPR 9X6 STRL LF SNTH (GAUZE/BANDAGES/DRESSINGS) ×2
BNDG ELASTIC 4X5.8 VLCR STR LF (GAUZE/BANDAGES/DRESSINGS) IMPLANT
BNDG ESMARK 6X9 LF (GAUZE/BANDAGES/DRESSINGS) ×4
CANISTER SUCT 3000ML PPV (MISCELLANEOUS) ×4 IMPLANT
CLIP FOGARTY SPRING 6M (CLIP) IMPLANT
CLIP VESOCCLUDE MED 24/CT (CLIP) ×8 IMPLANT
CLIP VESOCCLUDE SM WIDE 24/CT (CLIP) ×12 IMPLANT
COVER PROBE W GEL 5X96 (DRAPES) ×4 IMPLANT
COVER WAND RF STERILE (DRAPES) ×1 IMPLANT
CUFF TOURN SGL QUICK 18X4 (TOURNIQUET CUFF) IMPLANT
CUFF TOURN SGL QUICK 24 (TOURNIQUET CUFF)
CUFF TOURN SGL QUICK 34 (TOURNIQUET CUFF)
CUFF TOURN SGL QUICK 42 (TOURNIQUET CUFF) IMPLANT
CUFF TRNQT CYL 24X4X16.5-23 (TOURNIQUET CUFF) IMPLANT
CUFF TRNQT CYL 34X4.125X (TOURNIQUET CUFF) IMPLANT
DERMABOND ADVANCED (GAUZE/BANDAGES/DRESSINGS) ×8
DERMABOND ADVANCED .7 DNX12 (GAUZE/BANDAGES/DRESSINGS) ×4 IMPLANT
DRAIN CHANNEL 15F RND FF W/TCR (WOUND CARE) IMPLANT
DRAPE C-ARM 42X72 X-RAY (DRAPES) IMPLANT
DRAPE HALF SHEET 40X57 (DRAPES) IMPLANT
DRAPE INCISE IOBAN 66X45 STRL (DRAPES) ×3 IMPLANT
ELECT REM PT RETURN 9FT ADLT (ELECTROSURGICAL) ×4
ELECTRODE REM PT RTRN 9FT ADLT (ELECTROSURGICAL) ×2 IMPLANT
EVACUATOR SILICONE 100CC (DRAIN) IMPLANT
GAUZE 4X4 16PLY RFD (DISPOSABLE) ×6 IMPLANT
GAUZE SPONGE 4X4 16PLY XRAY LF (GAUZE/BANDAGES/DRESSINGS) ×4 IMPLANT
GLOVE BIO SURGEON STRL SZ7.5 (GLOVE) ×4 IMPLANT
GLOVE SRG 8 PF TXTR STRL LF DI (GLOVE) ×2 IMPLANT
GLOVE SURG SS PI 6.5 STRL IVOR (GLOVE) ×4 IMPLANT
GLOVE SURG UNDER POLY LF SZ6.5 (GLOVE) ×2 IMPLANT
GLOVE SURG UNDER POLY LF SZ8 (GLOVE) ×4
GOWN STRL REUS W/ TWL LRG LVL3 (GOWN DISPOSABLE) ×8 IMPLANT
GOWN STRL REUS W/ TWL XL LVL3 (GOWN DISPOSABLE) ×4 IMPLANT
GOWN STRL REUS W/TWL LRG LVL3 (GOWN DISPOSABLE) ×16
GOWN STRL REUS W/TWL XL LVL3 (GOWN DISPOSABLE) ×8
HEMOSTAT SNOW SURGICEL 2X4 (HEMOSTASIS) ×8 IMPLANT
HEMOSTAT SPONGE AVITENE ULTRA (HEMOSTASIS) IMPLANT
INSERT FOGARTY SM (MISCELLANEOUS) ×2 IMPLANT
KIT BASIN OR (CUSTOM PROCEDURE TRAY) ×4 IMPLANT
KIT TURNOVER KIT B (KITS) ×4 IMPLANT
LOOP VESSEL MINI RED (MISCELLANEOUS) ×4 IMPLANT
NS IRRIG 1000ML POUR BTL (IV SOLUTION) ×8 IMPLANT
PACK PERIPHERAL VASCULAR (CUSTOM PROCEDURE TRAY) ×4 IMPLANT
PAD ARMBOARD 7.5X6 YLW CONV (MISCELLANEOUS) ×8 IMPLANT
PENCIL BUTTON HOLSTER BLD 10FT (ELECTRODE) ×4 IMPLANT
SET MICROPUNCTURE 5F STIFF (MISCELLANEOUS) IMPLANT
STAPLER VISISTAT 35W (STAPLE) IMPLANT
STOPCOCK 4 WAY LG BORE MALE ST (IV SETS) IMPLANT
SUT ETHILON 3 0 PS 1 (SUTURE) IMPLANT
SUT GORETEX 5 0 TT13 24 (SUTURE) IMPLANT
SUT GORETEX 6.0 TT13 (SUTURE) IMPLANT
SUT MNCRL AB 4-0 PS2 18 (SUTURE) ×16 IMPLANT
SUT PROLENE 5 0 C 1 24 (SUTURE) ×10 IMPLANT
SUT PROLENE 6 0 BV (SUTURE) ×34 IMPLANT
SUT PROLENE 7 0 BV 1 (SUTURE) IMPLANT
SUT SILK 2 0 PERMA HAND 18 BK (SUTURE) IMPLANT
SUT SILK 3 0 (SUTURE) ×12
SUT SILK 3-0 18XBRD TIE 12 (SUTURE) ×6 IMPLANT
SUT VIC AB 2-0 CT1 27 (SUTURE) ×20
SUT VIC AB 2-0 CT1 TAPERPNT 27 (SUTURE) ×10 IMPLANT
SUT VIC AB 3-0 SH 27 (SUTURE) ×20
SUT VIC AB 3-0 SH 27X BRD (SUTURE) ×10 IMPLANT
SYR BULB IRRIG 60ML STRL (SYRINGE) ×3 IMPLANT
TAPE UMBILICAL COTTON 1/8X30 (MISCELLANEOUS) IMPLANT
TOWEL GREEN STERILE (TOWEL DISPOSABLE) ×4 IMPLANT
TRAY FOLEY MTR SLVR 16FR STAT (SET/KITS/TRAYS/PACK) ×4 IMPLANT
TUBING EXTENTION W/L.L. (IV SETS) IMPLANT
UNDERPAD 30X36 HEAVY ABSORB (UNDERPADS AND DIAPERS) ×4 IMPLANT
WATER STERILE IRR 1000ML POUR (IV SOLUTION) ×4 IMPLANT

## 2020-08-22 NOTE — Progress Notes (Signed)
Dr. Desmond Lope aware of blood sugar of 74 will recheck at 0900 am

## 2020-08-22 NOTE — Op Note (Addendum)
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  Harvest of right great saphenous vein 2.  Left common femoral artery to posterior tibial artery bypass with nonreversed contralateral great saphenous vein  Surgeon: Dr. Cephus Shelling, MD  Assistant: Dr. Lemar Livings, MD and Aggie Moats, [place]  Indication: Patient is a 80 year old male who was seen in clinic last week with critical limb ischemia of the left lower extremity with rest pain and a thrombosed left popliteal artery aneurysm.  He presented late last week for arteriogram that identified abnormal high takeoff of the PT just below the popliteal aneurysm and the PT being the dominant runoff into the foot.  He presents today for planned left lower extremity bypass after risk benefits discussed.  An assistant was needed for exposure and to expedite the case.  Findings: There was no long segment saphenous vein in the left leg and ultimately had to harvest saphenous vein from the right leg.  The vein was somewhat sclerotic and had varicosities and ultimately was occluded in the mid calf distally, but we did have enough to get from the left common femoral to the tibial vessel just below the popliteal aneurysm.  Ultimately left common femoral to PT bypass was tunneled subfascial and subsartorial.  We had very brisk posterior tibial signal with retrograde flow down the AT peroneal given high takeoff of the PT.  Anesthesia: General  Details: Patient was taken to the operating room after informed consent was obtained.  Placed on the operative table in supine position.  General endotracheal anesthesia was induced.  Ultimately evaluated initially left leg and did not find any long segment saphenous vein given that this branched in the proximal thigh.  In the right leg there was a much  more robust vein that was longer and this was marked from the groin down to below the knee.  Bilateral groins and both lower extremities were then prepped draped in usual sterile fashion.  Antibiotics were given.  Preop timeout was performed.  I initially started with the below-knee popliteal exposure and a medial longitudinal incision was made on the left leg one fingerbreath posterior to the tibia.  Ultimately carried the dissection into the popliteal space.  He had abnormal vascular anatomy given a high takeoff of the PT with a posterior tibial that was essentially the same size as the common trunk for the anterior tibial and peroneal artery.  Dominant runoff into the foot was through the posterior tibial.  Ultimately I initially isolated what I thought was the common trunk of the anterior tibial peroneal and placed Vesseloops.  Ultimately Dr. Randie Heinz started with skip incisions in the right leg and ultimately the vein was circumferentially mobilized and all side branches were ligated between 3-0 silk ties and divided and also small clips.  Ultimately once it became apparent that we had enough vein to get to our target below the knee, I made a longitudinal incision in the left groin above the inguinal crease.  Dissected down and ultimately got down to the common femoral artery that was dissected out where this was aneurysmal but not large enough to be replaced.  I was able to get Vesseloops proximally distally and had a soft place for clamping.  Ultimately I finished helping Dr. Randie Heinz harvest all the vein and this was ligated with a 2-0 silk distally and passed between  skin bridges.  The saphenofemoral junction was oversewn with 5-0 Prolene.  Ultimately we reversed the vein and attempted to flush this but we could not get a vessel cannula in the distal portion of the vein given that it was sclerotic here and we ultimately had to cut some of the vein away in order to get to a healthy portion to place a vessel cannula.   Ultimately we finally were able to flush the vein and dilated reasonably.  It was mildly thickened.  We had to repair at least three areas with 6-0 Prolene's.  Finally we went ahead and tunneled from the below-knee popliteal space up to the common femoral in subfascial subsartorial plane.  Patient was given 100 units per kilogram heparin IV and ACT was checked to maintain greater than 250.  I used a Henley clamp on the distal external iliac and a baby profunda clamp distally on the common femoral.  The common femoral was opened on the anterior wall with 11 blade scalpel extended with Potts scissors.  We brought our saphenous vein on the field and this was sewn in nonreversed fashion after it was spatulated to the common femoral artery in end to side fashion.  We then used a valvulotome and all valves were lysed.  We did have a little bit of resistance passing the valvulotome given some sclerosis of the vein but ultimately once we were finished lysing all the valves we did have nice dilated vein with pulsatile flow distally.  The vein was then marked for orientation after 2 clips were placed distally.  I passed the vein through the tunneler and ultimately the leg was straightened and marked.  Ultimately we then opened the posterior tibial artery (and realized the common trunk of the anterior tibial and peroneal was more medial) given this was essentially the same size as the common trunk of the anterior tibial and peroneal with best flow into the foot via the posterior tibial I elected to use the posterior tibial artery).  I then cut the vein to the appropriate length and it was spatulated.  An end-to-side anastomosis was sewn to the posterior tibial artery in our exposure with 6-0 Prolene parachute technique.  We did de-air everything prior to completion.  We had excellent triphasic flow at the ankle in the posterior tibial artery.  Ultimately protamine was given for reversal.  We washed out both groin and  below-knee popliteal incision.  Surgicel snow was used for hemostasis.  We then closed incisions in multiple layers of 2-0 Vicryl, 3-0 Vicryl, 4-0 Monocryl Dermabond.  The saphenectomy incisions in right leg were closed with 3-0 Vicryl, 4-0 Monocryl, and Dermabond.  Complication: None  Condition: Stable  Cephus Shelling, MD [facility] Office: [phone]   Cephus Shelling

## 2020-08-22 NOTE — Anesthesia Procedure Notes (Signed)
Arterial Line Insertion Start/End1/26/2022 9:40 AM, 08/22/2020 9:45 AM Performed by: Quentin Ore, CRNA, CRNA  Preanesthetic checklist: patient identified, IV checked, risks and benefits discussed, surgical consent, monitors and equipment checked and pre-op evaluation Lidocaine 1% used for infiltration Left, radial was placed Catheter size: 20 G Hand hygiene performed , maximum sterile barriers used  and Seldinger technique used Allen's test indicative of satisfactory collateral circulation Attempts: 1 Procedure performed without using ultrasound guided technique. Following insertion, Biopatch and dressing applied. Patient tolerated the procedure well with no immediate complications.

## 2020-08-22 NOTE — H&P (Signed)
History and Physical Interval Note:  08/22/2020 10:11 AM  [name]  has presented today for surgery, with the diagnosis of PAD Popliteal aneurysm.  The various methods of treatment have been discussed with the patient and family. After consideration of risks, benefits and other options for treatment, the patient has consented to  Procedure(s): LEFT FEMORAL-POPLITEAL ARTERY BYPASS GRAFTING (Left) EXCLUSION OF LEFT POPLITEAL ARTERY ANEURYSM (Left) as a surgical intervention.  The patient's history has been reviewed, patient examined, no change in status, stable for surgery.  I have reviewed the patient's chart and labs.  Questions were answered to the patient's satisfaction.    Left fem pop bypass for thrombosed left popliteal artery aneurysm.  Cephus Shelling  Patient name: [name]         MRN: [id number]        DOB: 02/05/1941          Sex: male  REASON FOR CONSULT: Thrombosed left popliteal artery aneurysm  HPI: [name] is a 80 y.o. male, with history of COPD on 2 L home oxygen, hypertension, hyperlipidemia, coronary artery disease status post right coronary stent in 2018, history of aorto bi-iliac bypass for AAA that presents for evaluation of thrombosed left popliteal artery aneurysm.  Patient states he was in his normal state of health until about Thanksgiving when he started noticing increasing pain in the left leg.  Apparently he was out working in his yard when this started.  He does describe a lot of cramping in the calf when he walks and also a lot of pain in the calf into the foot.  He states this was waking him up at night until he started taking narcotics to alleviate some of this pain.  He has no active tissue loss at this time.  In regards to previous left leg interventions he does describe a left lower extremity embolectomy and and this was done in 2002 by Dr. Hart Rochester.  In addition he had an aortobiiliac graft in 2004 for AAA.    Prior to his  referral to vascular surgery he was seen by cardiology.  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      Past Medical History:  Diagnosis Date  . 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 Anxiety   . Aortic aneurysm (HCC) 09/02/2017  . BENIGN PROSTATIC HYPERTROPHY 11/23/2009  . Cardiomyopathy (HCC) 08/28/2016  . Chronic systolic CHF (congestive heart failure) (HCC) 09/02/2017  . COPD (chronic obstructive pulmonary disease) (HCC)   . CORONARY ARTERY DISEASE 11/23/2009  . 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 DEGENERATIVE JOINT DISEASE 11/23/2009  . DEPRESSION 11/23/2009  . FATIGUE 11/23/2009  . GAIT DISTURBANCE 12/10/2009  . HEMOPTYSIS UNSPECIFIED 05/07/2010  . High cholesterol   . HYPERTENSION 07/30/2009  . HYPOTHYROIDISM 07/30/2009  . Ischemic cardiomyopathy 09/02/2017  . 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 On home oxygen therapy    "2-3L; 24/7" (09/10/2016)  . OSA on CPAP   . PTSD (post-traumatic stress disorder) 03/10/2012  . 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 RA (rheumatoid arthritis) (HCC) 06/11/2011   "qwhere" (09/10/2016)  . RESPIRATORY FAILURE, CHRONIC 07/31/2009  . Scleritis of both eyes 03/17/2014  . Thrombocytopenia (HCC)   . 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 Type II diabetes mellitus (HCC)          Past Surgical History:  Procedure Laterality Date  . 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 ABDOMINAL EXPLORATION SURGERY  02/2004   w/LOA/notes 12/10/2010; small bowel obstruction repair with adhesiolysis   . BACK SURGERY    . CARDIAC CATHETERIZATION     2 heart caths in the past.  One in 2000s showed one ulcerated plaque  Rx medically; [facility] [name] 09/05/2016  . CATARACT EXTRACTION W/ INTRAOCULAR LENS  IMPLANT, BILATERAL Bilateral 2000s  . COLECTOMY     hx of remote ileum resection due  to bleeding  . COLONOSCOPY WITH PROPOFOL N/A 01/22/2018   Procedure: COLONOSCOPY WITH PROPOFOL;  Surgeon: [place] Boop, MD;  Location: [facility] END[facility]COPY;  Service: Endoscopy;  Laterality: N/A;  . CORONARY ANGIOPLASTY WITH STENT PLACEMENT  09/10/2016  . CORONARY STENT INTERVENTION N/A 09/10/2016   Procedure: Coronary Stent Intervention;  Surgeon: Kathleene Hazel, MD;  Location: [facility] INVASIVE CV LAB;  Service: Cardiovascular;  Laterality: N/A;  Distal RCA 4.0x16 Synergy  . ESOPHAGOGASTRODUODENOSCOPY (EGD) WITH PROPOFOL N/A 01/22/2018   Procedure: ESOPHAGOGASTRODUODENOSCOPY (EGD) WITH PROPOFOL;  Surgeon: [place] Boop, MD;  Location: [facility];  Service: Endoscopy;  Laterality: N/A;  . FEMORAL EMBOLOECTOMY Left 07/2000   with left leg ischemia; Dr. Hart Rochester, vascular  . GANGLION CYST EXCISION Right    "wrist"; Dr. Teressa Senter  . HOT HEMOSTASIS N/A 01/22/2018   Procedure: HOT HEMOSTASIS (ARGON PLASMA COAGULATION/BICAP);  Surgeon: [place] Boop, MD;  Location: [facility];  Service: Endoscopy;  Laterality: N/A;  . LOOP RECORDER INSERTION N/A 04/25/2019   Procedure: LOOP RECORDER INSERTION;  Surgeon: Duke Salvia, MD;  Location: [facility] INVASIVE CV LAB;  Service: Cardiovascular;  Laterality: N/A;  . LOOP RECORDER REMOVAL N/A 07/01/2019   Procedure: LOOP RECORDER REMOVAL;  Surgeon: Duke Salvia, MD;  Location: [facility] INVASIVE CV LAB;  Service: Cardiovascular;  Laterality: N/A;  . LUMBAR LAMINECTOMY  1972   Dr. Fannie Knee  . PACEMAKER IMPLANT N/A 07/01/2019   Procedure: PACEMAKER IMPLANT;  Surgeon: Duke Salvia, MD;  Location: [facility] INVASIVE CV LAB;  Service: Cardiovascular;  Laterality: N/A;  . RIGHT/LEFT HEART CATH AND CORONARY ANGIOGRAPHY N/A 09/10/2016   Procedure: Right/Left Heart Cath and Coronary Angiography;  Surgeon: Kathleene Hazel, MD;  Location: [facility] INVASIVE CV LAB;  Service: Cardiovascular;  Laterality: N/A;  . TONSILLECTOMY           Family History  Problem Relation Age  of Onset  . Other Mother        gun shot    SOCIAL HISTORY: Social History        Socioeconomic History  . Marital status: Widowed    Spouse name: Not on file  . Number of children: Not on file  . Years of education: Not on file  . Highest education level: Not on file  Occupational History  . Occupation: disabled Cytogeneticist, Ex Neurosurgeon: RETIRED  Tobacco Use  . Smoking status: Former Smoker    Packs/day: 2.50    Years: 40.00    Pack years: 100.00    Types: Cigarettes, Pipe, Cigars    Quit date: 07/28/1998    Years since quitting: 22.0  . Smokeless tobacco: Never Used  Vaping Use  . Vaping Use: Never used  Substance and Sexual Activity  . Alcohol use: No    Alcohol/week: 0.0 standard drinks  . Drug use: No  . Sexual activity: Not Currently  Other Topics Concern  . Not on file  Social  History Narrative   Lives in the home with his wife   Sees [place] every 6 month for meds.   Denies asbestos exposure   Social Determinants of Corporate investment banker Strain: Not on file  Food Insecurity: Not on file  Transportation Needs: Not on file  Physical Activity: Not on file  Stress: Not on file  Social Connections: Not on file  Intimate Partner Violence: Not on file    No Known Allergies        Current Outpatient Medications  Medication Sig Dispense Refill  . albuterol (PROVENTIL) (2.5 MG/3ML) 0.083% nebulizer solution 1 vial in nebulizer every 6 hours as needed Dx 496 120 mL 6  . ALPRAZolam (XANAX) 0.5 MG tablet Take 0.5 mg by mouth at bedtime.     [name] atorvastatin (LIPITOR) 40 MG tablet Take 1 tablet (40 mg total) by mouth daily. 90 tablet 3  . Carboxymethylcellul-Glycerin (LUBRICATING EYE DROPS OP) Place 1 drop into both eyes 4 (four) times daily as needed (dry eyes).     . Cholecalciferol (VITAMIN D3) 2000 units capsule Take 2,000 Units by mouth daily.     . empagliflozin (JARDIANCE) 10 MG TABS tablet Take 5 mg by  mouth daily. Take 1/2 tablet daily (5 mg). Filled at [place].    . insulin glargine (LANTUS) 100 UNIT/ML injection Inject 60 Units into the skin at bedtime.     [name] levothyroxine (SYNTHROID) 150 MCG tablet Take 150 mcg by mouth daily before breakfast.    . losartan (COZAAR) 50 MG tablet Take 1 tablet (50 mg total) by mouth daily. 90 tablet 3  . metFORMIN (GLUCOPHAGE) 500 MG tablet Take 500 mg by mouth 2 (two) times daily with a meal. Take 2 in the am; 1 in the pm    . Multiple Vitamins-Minerals (MEGA MULTIVITAMIN FOR MEN PO) Take 1 tablet by mouth daily.    . Olodaterol HCl (STRIVERDI RESPIMAT) 2.5 MCG/ACT AERS Inhale 1 puff into the lungs daily.    [name] oxyCODONE-acetaminophen (PERCOCET/ROXICET) 5-325 MG tablet Take 1 tablet by mouth every 6 (six) hours as needed for up to 5 days for severe pain. 20 tablet 0  . OXYGEN Inhale 2 L into the lungs continuous.    . predniSONE (DELTASONE) 5 MG tablet Take 1 tablet (5 mg total) by mouth daily with breakfast. 90 tablet 3  . Semaglutide (OZEMPIC, 1 MG/DOSE, [place]) Inject into the skin as directed.    . tamsulosin (FLOMAX) 0.4 MG CAPS capsule Take 1 capsule (0.4 mg total) by mouth at bedtime. 30 capsule 0  . tiotropium (SPIRIVA HANDIHALER) 18 MCG inhalation capsule Place 1 capsule (18 mcg total) into inhaler and inhale daily. 30 capsule 4  . furosemide (LASIX) 40 MG tablet Take 1 tablet (40 mg total) by mouth daily. 90 tablet 3   No current facility-administered medications for this visit.    REVIEW OF SYSTEMS:  [X]  denotes positive finding, [ ]  denotes negative finding Cardiac  Comments:  Chest pain or chest pressure:    Shortness of breath upon exertion: x   Short of breath when lying flat:    Irregular heart rhythm:        Vascular    Pain in calf, thigh, or hip brought on by ambulation: x   Pain in feet at night that wakes you up from your sleep:     Blood clot in your veins:    Leg swelling:  x       Pulmonary  Oxygen at home:    Productive cough:     Wheezing:         Neurologic    Sudden weakness in arms or legs:     Sudden numbness in arms or legs:     Sudden onset of difficulty speaking or slurred speech:    Temporary loss of vision in one eye:     Problems with dizziness:  x       Gastrointestinal    Blood in stool:     Vomited blood:         Genitourinary    Burning when urinating:     Blood in urine:        Psychiatric    Major depression:         Hematologic    Bleeding problems:    Problems with blood clotting too easily:        Skin    Rashes or ulcers:        Constitutional    Fever or chills:      PHYSICAL EXAM:    Vitals:   08/14/20 1058  BP: (!) 160/75  Pulse: (!) 58  Resp: 18  Temp: 97.7 F (36.5 C)  TempSrc: Temporal  SpO2: (!) 18%  Weight: 213 lb (96.6 kg)  Height: 6\' 2"  (1.88 m)    GENERAL: The patient is a well-nourished male, in no acute distress. The vital signs are documented above. CARDIAC: There is a regular rate and rhythm.  VASCULAR:  Palpable femoral pulses both groins Right popliteal pulse easily palpable and prominent Left popliteal pulse nonpalpable Right DP palpable Left PT signal but nonpalpable No lower extremity tissue loss PULMONARY: No respiratory distress.  2 L [place]. ABDOMEN: Soft and non-tender. MUSCULOSKELETAL: There are no major deformities or cyanosis. NEUROLOGIC: No focal weakness or paresthesias are detected. SKIN: There are no ulcers or rashes noted. PSYCHIATRIC: The patient has a normal affect.  DATA:   Lower extremity arterial duplex from 07/31/2020 shows a 2.8 cm right popliteal artery aneurysm and a thrombosed 2.5 cm left popliteal artery aneurysm  Assessment/Plan:  80 year old male presents for evaluation of a thrombosed 2.5 cm left popliteal artery aneurysm.  Discussed with him in detail that in addition to a thrombosed left  popliteal artery aneurysm he also has a 2.8 cm right popliteal aneurysm. Discussed that current guidelines are to repair popliteal aneurysms greater than 2 to 2.5 cm. The initial focus should be his thrombosed left popliteal artery aneurysm given he having significant left leg symptoms.  I recommended a left leg arteriogram for identification of a target and we will also get vein mapping in the hospital later this week and likely require bypass in the left leg if he has a target.  Discussed at the time we would also get right leg runoff and evaluate for surgical options for his right popliteal artery aneurysm at a later time.  We discussed primary risk of popliteal artery aneurysm is thrombosis and embolism putting him at risk of limb loss.   71, MD [facility] Office: [phone]

## 2020-08-22 NOTE — Transfer of Care (Signed)
Immediate Anesthesia Transfer of Care Note  Patient: [name]  Procedure(s) Performed: LEFT FEMORAL-POSTERIOR TIBIAL ARTERY BYPASS GRAFT USING RIGHT GREATER NONREVERSED SAPHENOUS VEIN GRAFT (Bilateral Leg Upper) EXCLUSION OF LEFT POPLITEAL ARTERY ANEURYSM (Left )  Patient Location: PACU  Anesthesia Type:General  Level of Consciousness: drowsy  Airway & Oxygen Therapy: Patient Spontanous Breathing and Patient connected to face mask oxygen  Post-op Assessment: Report given to RN and Post -op Vital signs reviewed and stable  Post vital signs: Reviewed and stable  Last Vitals:  Vitals Value Taken Time  BP 151/84 08/22/20 1452  Temp    Pulse 79 08/22/20 1457  Resp 19 08/22/20 1457  SpO2 100 % 08/22/20 1457  Vitals shown include unvalidated device data.  Last Pain:  Vitals:   08/22/20 0901  TempSrc:   PainSc: 0-No pain      Patients Stated Pain Goal: 3 (08/22/20 0901)  Complications: No complications documented.

## 2020-08-22 NOTE — Anesthesia Procedure Notes (Signed)
Procedure Name: Intubation Date/Time: 08/22/2020 10:46 AM Performed by: Waynard Edwards, CRNA Pre-anesthesia Checklist: Patient identified, Emergency Drugs available, Suction available and Patient being monitored Patient Re-evaluated:Patient Re-evaluated prior to induction Oxygen Delivery Method: Circle system utilized Preoxygenation: Pre-oxygenation with 100% oxygen Induction Type: IV induction Ventilation: Mask ventilation without difficulty and Oral airway inserted - appropriate to patient size Laryngoscope Size: Hyacinth Meeker and 3 Grade View: Grade I Tube type: Oral Tube size: 7.5 mm Number of attempts: 1 Airway Equipment and Method: Stylet and Oral airway Placement Confirmation: ETT inserted through vocal cords under direct vision,  positive ETCO2 and breath sounds checked- equal and bilateral Secured at: 24 cm Tube secured with: Tape Dental Injury: Teeth and Oropharynx as per pre-operative assessment

## 2020-08-22 NOTE — Anesthesia Postprocedure Evaluation (Signed)
Anesthesia Post Note  Patient: TOBEN ACUNA  Procedure(s) Performed: LEFT FEMORAL-POSTERIOR TIBIAL ARTERY BYPASS GRAFT USING RIGHT GREATER NONREVERSED SAPHENOUS VEIN GRAFT (Bilateral Leg Upper) EXCLUSION OF LEFT POPLITEAL ARTERY ANEURYSM (Left )     Patient location during evaluation: PACU Anesthesia Type: General Level of consciousness: awake and alert, patient cooperative and oriented Pain management: pain level controlled Vital Signs Assessment: post-procedure vital signs reviewed and stable Respiratory status: spontaneous breathing, nonlabored ventilation, respiratory function stable and patient connected to nasal cannula oxygen Cardiovascular status: blood pressure returned to baseline and stable Postop Assessment: no apparent nausea or vomiting and adequate PO intake Anesthetic complications: no   No complications documented.  Last Vitals:  Vitals:   08/22/20 1655 08/22/20 1725  BP: 115/79 134/70  Pulse: 90 87  Resp: 20 (!) 21  Temp:    SpO2: 96% 96%    Last Pain:  Vitals:   08/22/20 1655  TempSrc:   PainSc: Asleep                 Alieah Brinton,E. Lawanna Cecere

## 2020-08-23 ENCOUNTER — Encounter (HOSPITAL_COMMUNITY): Payer: Self-pay | Admitting: Vascular Surgery

## 2020-08-23 LAB — CBC
HCT: 25.1 % — ABNORMAL LOW (ref 39.0–52.0)
Hemoglobin: 7.4 g/dL — ABNORMAL LOW (ref 13.0–17.0)
MCH: 25.5 pg — ABNORMAL LOW (ref 26.0–34.0)
MCHC: 29.5 g/dL — ABNORMAL LOW (ref 30.0–36.0)
MCV: 86.6 fL (ref 80.0–100.0)
Platelets: 70 10*3/uL — ABNORMAL LOW (ref 150–400)
RBC: 2.9 MIL/uL — ABNORMAL LOW (ref 4.22–5.81)
RDW: 15.7 % — ABNORMAL HIGH (ref 11.5–15.5)
WBC: 7.9 10*3/uL (ref 4.0–10.5)
nRBC: 0 % (ref 0.0–0.2)

## 2020-08-23 LAB — GLUCOSE, CAPILLARY
Glucose-Capillary: 209 mg/dL — ABNORMAL HIGH (ref 70–99)
Glucose-Capillary: 189 mg/dL — ABNORMAL HIGH (ref 70–99)
Glucose-Capillary: 154 mg/dL — ABNORMAL HIGH (ref 70–99)
Glucose-Capillary: 163 mg/dL — ABNORMAL HIGH (ref 70–99)

## 2020-08-23 LAB — LIPID PANEL
Cholesterol: 107 mg/dL (ref 0–200)
HDL: 27 mg/dL — ABNORMAL LOW (ref >40)
LDL Cholesterol: 57 mg/dL (ref 0–99)
Total CHOL/HDL Ratio: 4 RATIO
Triglycerides: 113 mg/dL (ref <150)
VLDL: 23 mg/dL (ref 0–40)

## 2020-08-23 LAB — POCT I-STAT 7, (LYTES, BLD GAS, ICA,H+H)
Acid-base deficit: 1 mmol/L (ref 0.0–2.0)
Bicarbonate: 25.5 mmol/L (ref 20.0–28.0)
Calcium, Ion: 1.24 mmol/L (ref 1.15–1.40)
HCT: 30 % — ABNORMAL LOW (ref 39.0–52.0)
Hemoglobin: 10.2 g/dL — ABNORMAL LOW (ref 13.0–17.0)
O2 Saturation: 98 %
Potassium: 4.5 mmol/L (ref 3.5–5.1)
Sodium: 141 mmol/L (ref 135–145)
TCO2: 27 mmol/L (ref 22–32)
pCO2 arterial: 48.6 mmHg — ABNORMAL HIGH (ref 32.0–48.0)
pH, Arterial: 7.328 — ABNORMAL LOW (ref 7.350–7.450)
pO2, Arterial: 119 mmHg — ABNORMAL HIGH (ref 83.0–108.0)

## 2020-08-23 LAB — POCT ACTIVATED CLOTTING TIME
Activated Clotting Time: 244 seconds
Activated Clotting Time: 297 seconds

## 2020-08-23 LAB — BASIC METABOLIC PANEL
Anion gap: 8 (ref 5–15)
BUN: 26 mg/dL — ABNORMAL HIGH (ref 8–23)
CO2: 25 mmol/L (ref 22–32)
Calcium: 8.4 mg/dL — ABNORMAL LOW (ref 8.9–10.3)
Chloride: 106 mmol/L (ref 98–111)
Creatinine, Ser: 1.5 mg/dL — ABNORMAL HIGH (ref 0.61–1.24)
GFR, Estimated: 47 mL/min — ABNORMAL LOW (ref >60)
Glucose, Bld: 198 mg/dL — ABNORMAL HIGH (ref 70–99)
Potassium: 5.3 mmol/L — ABNORMAL HIGH (ref 3.5–5.1)
Sodium: 139 mmol/L (ref 135–145)

## 2020-08-23 LAB — CREATININE, SERUM
Creatinine, Ser: 1.52 mg/dL — ABNORMAL HIGH (ref 0.61–1.24)
GFR, Estimated: 47 mL/min — ABNORMAL LOW (ref >60)

## 2020-08-23 MED ORDER — ATORVASTATIN CALCIUM 80 MG PO TABS
80.0000 mg | ORAL_TABLET | Freq: Every day | ORAL | Status: DC
Start: 2020-08-24 — End: 2020-08-27
  Administered 2020-08-24 – 2020-08-27 (×4): 80 mg via ORAL
  Filled 2020-08-23 (×4): qty 1

## 2020-08-23 NOTE — Progress Notes (Signed)
Mobility Specialist: Progress Note   08/23/20 1534  Mobility  Activity Refused mobility   Pt refused mobility earlier today due to pain level and wanting to rest. Pt currently working with PT. Will f/u as able.   [facility] [place] Turri Mobility Specialist Mobility Specialist Phone: [phone]

## 2020-08-23 NOTE — Progress Notes (Signed)
OT Cancellation Note  Patient Details Name: [name] MRN: [id number] DOB: 11-12-1940   Cancelled Treatment:    Reason Eval/Treat Not Completed: Other (comment) RN present in room upon arrival of OT; reports pt just returned to supine after developing symptomatic orthostatic hypotension sitting EOB. Plan to reattempt at a later time/date.   Raynald Kemp, OT Acute Rehabilitation Services Pager: [phone] Office: [phone]   08/23/2020, 12:24 PM

## 2020-08-23 NOTE — Progress Notes (Addendum)
Inpatient Diabetes Program Recommendations  AACE/ADA: New Consensus Statement on Inpatient Glycemic Control (2015)  Target Ranges:  Prepandial:   less than 140 mg/dL      Peak postprandial:   less than 180 mg/dL (1-2 hours)      Critically ill patients:  140 - 180 mg/dL   Lab Results  Component Value Date   GLUCAP 189 (H) 08/23/2020   HGBA1C 10.3 (H) 08/21/2020    Review of Glycemic Control Results for [name], [name] (MRN [id number]) as of 08/23/2020 13:29  Ref. Range 08/22/2020 18:54 08/22/2020 22:51 08/23/2020 06:06 08/23/2020 11:15  Glucose-Capillary Latest Ref Range: 70 - 99 mg/dL 256 (H) 389 (H) 373 (H) 189 (H)   Diabetes history: DM 2 Outpatient Diabetes medications:  Ozempic weekly, Metformin 500 mg AM and 1000 mg pm, Lantus 60 units daily, Jardiance 5 mg daily Current orders for Inpatient glycemic control:  Novolog moderate tid with meals Lantus 60 units q HS Inpatient Diabetes Program Recommendations:    Note patient received Decadron 10 mg x1 yesterday during procedure.  Recommend slight reduction in Lantus to 50 units q HS.  CBG's improving.  Note that A1C is elevated.   Called patient's room to discuss A1C.  Daughter states he is sleeping.  Will f/u on 1/28.    Thanks,  Beryl Meager, RN, BC-ADM Inpatient Diabetes Coordinator Pager [phone] (8a-5p)

## 2020-08-23 NOTE — Progress Notes (Signed)
[facility] [place]  Subjective  -no complaints.  Thinks his left foot feels better.   Objective 138/65 88 98.4 F (36.9 C) (Oral) 18 95%  Intake/Output Summary (Last 24 hours) at 08/23/2020 0930 Last data filed at 08/23/2020 0431 Gross per 24 hour  Intake 3450 ml  Output 1720 ml  Net 1730 ml    Left groin incision and below-knee incision clean dry and intact Left PT signal triphasic also with brisk DP peroneal signal in left foot Significant bruising along his right leg saphenectomy incisions but no big hematoma  Laboratory Lab Results: Recent Labs    08/22/20 2218 08/23/20 0732  WBC 10.6* 7.9  HGB 8.7* 7.4*  HCT 29.3* 25.1*  PLT 62* 70*   BMET Recent Labs    08/21/20 1051 08/22/20 2218 08/23/20 0350  NA 139  --  139  K 4.7  --  5.3*  CL 106  --  106  CO2 23  --  25  GLUCOSE 137*  --  198*  BUN 18  --  26*  CREATININE 1.31* 1.52* 1.50*  CALCIUM 9.4  --  8.4*    COAG Lab Results  Component Value Date   INR 1.1 08/21/2020   INR 1.2 03/15/2019   INR 1.07 09/01/2017   No results found for: PTT  Assessment/Planning:  80 year old male now postop day 1 status post harvest of right great saphenous vein and then a left common femoral to PT bypass with nonreversed contralateral saphenous vein for occluded left popliteal aneurysm.  He has very brisk Doppler flow in the left foot.  Overall he looks good this morning.  He is on oxygen at baseline given his COPD.  Hemoglobin today 7.4 from 8.7 last night (baseline 11.8 and anemic prior to surgery) .  He has chronic thrombocytopenia and platelets are improved from 62 to 70 this morning.  Cr 1.3 to 1.5.  On gentle hydration 100 mL/hr.  Will re-check labs in am.  OOB to chair today and therapy ordered.  Cephus Shelling 08/23/2020 9:30 AM --

## 2020-08-23 NOTE — Evaluation (Signed)
Physical Therapy Evaluation Patient Details Name: [name] MRN: [id number] DOB: Jun 04, 1941 Today's Date: 08/23/2020   History of Present Illness  Pt adm for left fem pop bypass on 1/26 for thrombosed lt popliteal aneurysm. PMH - copd on home O2, HTN, CAD, aorto biiliac bypass for AAA, pacer, DM.  Clinical Impression  Pt presents to PT with limited mobility due to orthostasis, post-op pain , and decr balance. Expect mobility will progress quickly once pt without orthostasis and expect pt will be able to return home with support of daughter.     Follow Up Recommendations Home health PT;Supervision - Intermittent    Equipment Recommendations  Rolling walker with 5" wheels    Recommendations for Other Services       Precautions / Restrictions Precautions Precautions: Fall;Other (comment) Precaution Comments: watch orthostasis      Mobility  Bed Mobility Overal bed mobility: Needs Assistance Bed Mobility: Sit to Supine       Sit to supine: Min assist   General bed mobility comments: Assist to bring LLE back up into bed    Transfers Overall transfer level: Needs assistance Equipment used: Rolling walker (2 wheeled) Transfers: Sit to/from Stand Sit to Stand: Min assist;+2 safety/equipment         General transfer comment: Assist to bring hips up and for balance. 2nd person for safety due to orthostatic  Ambulation/Gait Ambulation/Gait assistance: Min assist;+2 safety/equipment Gait Distance (Feet): 2 Feet Assistive device: Rolling walker (2 wheeled) Gait Pattern/deviations: Step-to pattern;Antalgic;Decreased weight shift to left;Decreased stance time - left Gait velocity: decr Gait velocity interpretation: <1.31 ft/sec, indicative of household ambulator General Gait Details: Assist for balance and support to take several shuffling side steps up side of bed toward HOB.  Stairs            Wheelchair Mobility    Modified Rankin (Stroke Patients  Only)       Balance Overall balance assessment: Needs assistance Sitting-balance support: No upper extremity supported;Feet supported Sitting balance-Leahy Scale: Fair     Standing balance support: Bilateral upper extremity supported Standing balance-Leahy Scale: Poor Standing balance comment: walker and min assist for static standing. Stood x 2 for 60-90 sec                             Pertinent Vitals/Pain Pain Assessment: Faces Faces Pain Scale: Hurts little more Pain Location: LLE Pain Descriptors / Indicators: Grimacing;Guarding Pain Intervention(s): Monitored during session;Premedicated before session;Repositioned;Limited activity within patient's tolerance    Home Living Family/patient expects to be discharged to:: Private residence Living Arrangements: Alone Available Help at Discharge: Family;Available PRN/intermittently Type of Home: House (condo) Home Access: Level entry     Home Layout: One level Home Equipment: Cane - single point;Cane - quad;Grab bars - tub/shower      Prior Function Level of Independence: Independent with assistive device(s)         Comments: Uses cane     Hand Dominance        Extremity/Trunk Assessment   Upper Extremity Assessment Upper Extremity Assessment: Defer to OT evaluation    Lower Extremity Assessment Lower Extremity Assessment: Generalized weakness;LLE deficits/detail LLE Deficits / Details: limited by post op pain       Communication   Communication: No difficulties  Cognition Arousal/Alertness: Awake/alert Behavior During Therapy: WFL for tasks assessed/performed Overall Cognitive Status: Within Functional Limits for tasks assessed  General Comments General comments (skin integrity, edema, etc.): BP 79/51 sitting EOB and asymptomatic. In standing unable to get BP reading with pt unable to relax UE due to weight bearing on walker. Pt on O2  at baseline 2-3L.    Exercises     Assessment/Plan    PT Assessment Patient needs continued PT services  PT Problem List Decreased activity tolerance;Decreased balance;Decreased mobility;Pain       PT Treatment Interventions DME instruction;Gait training;Functional mobility training;Therapeutic exercise;Balance training;Patient/family education    PT Goals (Current goals can be found in the Care Plan section)  Acute Rehab PT Goals Patient Stated Goal: return home and progress from walker PT Goal Formulation: With patient/family Time For Goal Achievement: 08/30/20 Potential to Achieve Goals: Good    Frequency Min 3X/week   Barriers to discharge Decreased caregiver support Lives alone    Co-evaluation               AM-PAC PT "6 Clicks" Mobility  Outcome Measure Help needed turning from your back to your side while in a flat bed without using bedrails?: None Help needed moving from lying on your back to sitting on the side of a flat bed without using bedrails?: A Little Help needed moving to and from a bed to a chair (including a wheelchair)?: A Little Help needed standing up from a chair using your arms (e.g., wheelchair or bedside chair)?: A Little Help needed to walk in hospital room?: A Lot Help needed climbing 3-5 steps with a railing? : Total 6 Click Score: 16    End of Session Equipment Utilized During Treatment: Gait belt;Oxygen Activity Tolerance: Treatment limited secondary to medical complications (Comment) (orthostatic) Patient left: in bed;with call bell/phone within reach;with family/visitor present Nurse Communication: Mobility status;Other (comment) (orthostatic) PT Visit Diagnosis: Other abnormalities of gait and mobility (R26.89);Pain Pain - Right/Left: Left Pain - part of body: Leg    Time: 1505-1530 PT Time Calculation (min) (ACUTE ONLY): 25 min   Charges:   PT Evaluation $PT Eval Moderate Complexity: 1 Mod PT Treatments $Therapeutic  Activity: 8-22 mins        [facility] PT Acute Rehabilitation Services Pager [phone] Office [phone]   Angelina Ok [facility] 08/23/2020, 5:06 PM

## 2020-08-23 NOTE — Progress Notes (Signed)
PHARMACIST LIPID MONITORING   [name] is a 80 y.o. 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  Recent Labs:  Lipid Panel (last 6 months):   Lab Results  Component Value Date   CHOL 107 08/23/2020   TRIG 113 08/23/2020   HDL 27 (L) 08/23/2020   CHOLHDL 4.0 08/23/2020   VLDL 23 08/23/2020   LDLCALC 57 08/23/2020    Hepatic function panel (last 6 months):   Lab Results  Component Value Date   AST 21 08/21/2020   ALT 18 08/21/2020   ALKPHOS 62 08/21/2020   BILITOT 0.8 08/21/2020    SCr (since admission):   Serum creatinine: 1.5 mg/dL (H) 00/76/22 6333 Estimated creatinine clearance: 47.2 mL/min (A)  Current lipid-lowering therapy: Atorvastatin 40 mg Previous lipid-lowering therapies (if applicable): same Documented or reported allergies or intolerances to lipid-lowering therapies (if applicable): none  Assessment:  Pt with critical limb ischemia despite LDL = 57 on atorvastatin 40 mg.  Recommendation per protocol:  Increase intensity or dose of current statin.  Follow-up with: [facility] + primary care  Follow-up labs after discharge:    Liver function panel and lipid panel in 8-12 weeks then annually  Plan: Increase atorvastatin to 80 mg daily.  Reece Leader, Colon Flattery, BCCP Clinical Pharmacist  08/23/2020 9:31 AM   [facility] pharmacy phone numbers are listed on [email or web address]

## 2020-08-23 NOTE — Progress Notes (Signed)
Pt arrived to 4E 22 from PACU. CHG completed and new gown applied. Tele applied and CCMD notified. VS stable. Pulses dopplered bilaterally. Pt denies pain at this time. Pt oriented to room, bed and call light. Will continue to monitor.  Willa Frater, RN

## 2020-08-24 LAB — BASIC METABOLIC PANEL
Anion gap: 9 (ref 5–15)
BUN: 34 mg/dL — ABNORMAL HIGH (ref 8–23)
CO2: 26 mmol/L (ref 22–32)
Calcium: 8.6 mg/dL — ABNORMAL LOW (ref 8.9–10.3)
Chloride: 106 mmol/L (ref 98–111)
Creatinine, Ser: 1.87 mg/dL — ABNORMAL HIGH (ref 0.61–1.24)
GFR, Estimated: 36 mL/min — ABNORMAL LOW (ref >60)
Glucose, Bld: 96 mg/dL (ref 70–99)
Potassium: 4 mmol/L (ref 3.5–5.1)
Sodium: 141 mmol/L (ref 135–145)

## 2020-08-24 LAB — GLUCOSE, CAPILLARY
Glucose-Capillary: 66 mg/dL — ABNORMAL LOW (ref 70–99)
Glucose-Capillary: 71 mg/dL (ref 70–99)
Glucose-Capillary: 149 mg/dL — ABNORMAL HIGH (ref 70–99)
Glucose-Capillary: 182 mg/dL — ABNORMAL HIGH (ref 70–99)
Glucose-Capillary: 181 mg/dL — ABNORMAL HIGH (ref 70–99)

## 2020-08-24 LAB — HEMOGLOBIN AND HEMATOCRIT, BLOOD
HCT: 26.5 % — ABNORMAL LOW (ref 39.0–52.0)
Hemoglobin: 8.1 g/dL — ABNORMAL LOW (ref 13.0–17.0)

## 2020-08-24 LAB — CBC
HCT: 23.1 % — ABNORMAL LOW (ref 39.0–52.0)
Hemoglobin: 6.9 g/dL — CL (ref 13.0–17.0)
MCH: 25.8 pg — ABNORMAL LOW (ref 26.0–34.0)
MCHC: 29.9 g/dL — ABNORMAL LOW (ref 30.0–36.0)
MCV: 86.5 fL (ref 80.0–100.0)
Platelets: 72 10*3/uL — ABNORMAL LOW (ref 150–400)
RBC: 2.67 MIL/uL — ABNORMAL LOW (ref 4.22–5.81)
RDW: 15.9 % — ABNORMAL HIGH (ref 11.5–15.5)
WBC: 8.1 10*3/uL (ref 4.0–10.5)
nRBC: 0 % (ref 0.0–0.2)

## 2020-08-24 LAB — PREPARE RBC (CROSSMATCH)

## 2020-08-24 MED ORDER — INSULIN GLARGINE 100 UNIT/ML ~~LOC~~ SOLN
50.0000 [IU] | Freq: Every day | SUBCUTANEOUS | Status: DC
  Administered 2020-08-24 – 2020-08-26 (×3): 50 [IU] via SUBCUTANEOUS
  Filled 2020-08-24 (×4): qty 0.5

## 2020-08-24 MED ORDER — SODIUM CHLORIDE 0.9% IV SOLUTION: Freq: Once | INTRAVENOUS | Status: DC

## 2020-08-24 NOTE — Progress Notes (Addendum)
Inpatient Diabetes Program Recommendations  AACE/ADA: New Consensus Statement on Inpatient Glycemic Control (2015)  Target Ranges:  Prepandial:   less than 140 mg/dL      Peak postprandial:   less than 180 mg/dL (1-2 hours)      Critically ill patients:  140 - 180 mg/dL   Lab Results  Component Value Date   GLUCAP 149 (H) 08/24/2020   HGBA1C 10.3 (H) 08/21/2020    Review of Glycemic Control Results for [name], [name] (MRN [id number]) as of 08/24/2020 13:16  Ref. Range 08/23/2020 16:09 08/23/2020 21:15 08/24/2020 06:02 08/24/2020 06:29 08/24/2020 11:26  Glucose-Capillary Latest Ref Range: 70 - 99 mg/dL 419 (H) 379 (H) 66 (L) 71 149 (H)  Diabetes history: DM 2 Outpatient Diabetes medications:  Ozempic weekly, Metformin 500 mg AM and 1000 mg pm, Lantus 60 units daily, Jardiance 5 mg daily Current orders for Inpatient glycemic control:  Novolog moderate tid with meals Lantus 60 units q HS Inpatient Diabetes Program Recommendations:   Recommend slight reduction in Lantus to 50 units q HS.  CBG's improving.  Note that A1C is elevated.   Thanks,  Beryl Meager, RN, BC-ADM Inpatient Diabetes Coordinator Pager [phone] (8a-5p)  [phone] Addendum-Spoke with patient regarding home glycemic control.  He states that he has MD at the [facility] who manages his blood sugars.  He states that his A1C is often high however his Fructosamine is usually a better representation of his blood sugar control. Discussed low blood sugar this morning.  He states that he sometimes has lows.  Encouraged patient to report hypoglycemia to his outpatient provider and reminded him of the importance of avoiding and treatment of low blood sugars. He also states that blood sugars are difficult to control with steroids that he has to take.  Seems very knowledegable and plans to f/u with PCP.

## 2020-08-24 NOTE — Progress Notes (Addendum)
Mobility Specialist: Progress Note   08/24/20 1457  Mobility  Activity Stood at bedside  Level of Assistance Minimal assist, patient does 75% or more  Assistive Device Front wheel walker  Mobility Response Tolerated fair  Mobility performed by Mobility specialist  $Mobility charge 1 Mobility   Pre-Mobility (Sitting EOB): 80 HR, 125/56 BP, 91% SpO2 During Mobility:           Standing: 79/59 BP           Standing 2 minutes: 97/62 BP           Standing 4 minutes: 97/45 BP Post-Mobility: 74 HR, 142/50 BP, 91% SpO2  Pt performed mobility on 4 L/min [name]. Pt sitting EOB upon entering room. Pt eager for mobility. Pt orthostatic upon standing, BP as shown above. Pt c/o feeling a little dizzy but said it was minimal. Pt marched in place between the 2 and 4 minute BP. Pt sitting on EOB post session per request. Encouraged pt to continue bed exercises.   [facility] [name] Mobility Specialist Mobility Specialist Phone: [phone]

## 2020-08-24 NOTE — Progress Notes (Signed)
OT Cancellation Note  Patient Details Name: [name] MRN: [id number] DOB: [birth year]   Cancelled Treatment:    Reason Eval/Treat Not Completed: Other (comment). Second attempt at OT evaluation today to try to see premedicated. Pt declined OT session."Therapy. I just can't go down that road just yet." Pt did indicate that he would be agreeable to reattempting on another day. OT to reattempt at a later date as schedule allows.  Raynald Kemp, OT Acute Rehabilitation Services Pager: [phone] Office: [phone]  08/24/2020, 12:05 PM

## 2020-08-24 NOTE — Progress Notes (Addendum)
Progress Note    08/24/2020 7:37 AM 2 Days Post-Op  Subjective:  Sleeping but wakes easily.  Says he has some pains in the left leg.  Says he is on oxygen 24/7.  Afebrile HR 80's 130's systolic 96% 3LO2NC  Vitals:   08/24/20 0519 08/24/20 0520  BP:  134/62  Pulse:  87  Resp: 18   Temp: 97.7 F (36.5 C)   SpO2:  95%    Physical Exam: Cardiac:  Regularly irregular Lungs:  Non labored on O2 Incisions:  All incisions look fine Extremities:  Brisk doppler signals left and right DP/PT; left calf and anterior compartments are soft and non tender.    CBC    Component Value Date/Time   WBC 8.1 08/24/2020 0246   RBC 2.67 (L) 08/24/2020 0246   HGB 6.9 (LL) 08/24/2020 0246   HGB 11.8 (L) 08/06/2020 1004   HGB 12.0 (L) 06/28/2019 1453   HCT 23.1 (L) 08/24/2020 0246   HCT 38.4 06/28/2019 1453   PLT 72 (L) 08/24/2020 0246   PLT 73 (L) 08/06/2020 1004   PLT 44 (LL) 06/28/2019 1453   MCV 86.5 08/24/2020 0246   MCV 89 06/28/2019 1453   MCH 25.8 (L) 08/24/2020 0246   MCHC 29.9 (L) 08/24/2020 0246   RDW 15.9 (H) 08/24/2020 0246   RDW 13.8 06/28/2019 1453   LYMPHSABS 1.2 08/06/2020 1004   LYMPHSABS 1.2 12/23/2016 1053   MONOABS 0.7 08/06/2020 1004   EOSABS 0.1 08/06/2020 1004   EOSABS 0.3 12/23/2016 1053   BASOSABS 0.0 08/06/2020 1004   BASOSABS 0.0 12/23/2016 1053    BMET    Component Value Date/Time   NA 141 08/24/2020 0246   NA 136 02/22/2020 1029   K 4.0 08/24/2020 0246   CL 106 08/24/2020 0246   CO2 26 08/24/2020 0246   GLUCOSE 96 08/24/2020 0246   BUN 34 (H) 08/24/2020 0246   BUN 28 (H) 02/22/2020 1029   CREATININE 1.87 (H) 08/24/2020 0246   CREATININE 1.17 04/13/2020 0915   CALCIUM 8.6 (L) 08/24/2020 0246   GFRNONAA 36 (L) 08/24/2020 0246   GFRNONAA >60 07/15/2018 1059   GFRAA 58 (L) 02/22/2020 1029   GFRAA >60 07/15/2018 1059    INR    Component Value Date/Time   INR 1.1 08/21/2020 1051     Intake/Output Summary (Last 24 hours) at 08/24/2020  0737 Last data filed at 08/23/2020 1614 Gross per 24 hour  Intake --  Output 125 ml  Net -125 ml     Assessment:  80 y.o. male is s/p:  status post harvest of right great saphenous vein and then a left common femoral to PT bypass with nonreversed contralateral saphenous vein for occluded left popliteal aneurysm  2 Days Post-Op  Plan: -pt with brisk doppler signals bilateral feet.  -acute blood loss anemia-pt receiving PRBC's today.  Will recheck CBC in am -creatinine elevated this morning to 1.87 and was 1.5 yesterday.  Will hold Cozaar for now. -DVT prophylaxis:  Sq heparin -OOB -dry gauze to bilateral groins to wick moisture to help prevent wound infection   Doreatha Massed, PA-C [facility] [phone] 08/24/2020 7:37 AM   I have seen and evaluated the patient. I agree with the PA note as documented above.  Postop day 2 status post harvest of right great saphenous vein with a left common femoral to PT bypass for occluded left popliteal aneurysm.  All of his incisions look good other than some ecchymosis along the vein  harvest incision in the right leg.  He has triphasic PT signal at the right ankle.  Hemoglobin this morning is 6.9 and he does have chronic anemia but exacerbated by acute postop blood loss anemia following surgery.  We will give 1 unit packed red blood cells transfusion and recheck H&H later this morning.  Also watching his creatinine which has increased from 1.3 at baseline to 1.8 and has AKI.  Will continue to monitor, re-check BMP tomorrow.  Holding Cozaar.  Otherwise he has gotten out of bed and looks pretty good.  He is on home oxygen for baseline COPD.  Cephus Shelling, MD [facility] [place] Office: [phone]

## 2020-08-24 NOTE — Progress Notes (Signed)
CRITICAL VALUE ALERT  Critical Value:  HGB 6.9  Date & Time Notied:  28 Aug 18 348  Provider Notified: DR. Randie Heinz  Orders Received/Actions taken: 1 unit PRBC ordered

## 2020-08-24 NOTE — Progress Notes (Signed)
Physical Therapy Treatment Patient Details Name: [name] MRN: [id number] DOB: Aug 09, 1940 Today's Date: 08/24/2020    History of Present Illness Pt adm for left fem pop bypass on 1/26 for thrombosed lt popliteal aneurysm. PMH - copd on home O2, HTN, CAD, aorto biiliac bypass for AAA, pacer, DM.    PT Comments    Patient progressing slowly towards PT goals. Tired this morning due to not having slept well last night. Requires Min A to stand from EOB due to posterior bias and lean. Able to take a few steps in room to chair with Min A and RW. Mobility limited due to orthostasis. Supine BP 113/56, sitting BP 107/55, standing BP 83/45, sitting post transfer 99/52 with LEs elevated. Dizziness resolved in chair. SP02 remained in 90s on 2L/min 02 [place]. HR up to 140s bpm with activity. Encouraged walking with nursing later if feeling better and pending pressures. Continues to report pain in LLE. Will follow.   Follow Up Recommendations  Home health PT;Supervision - Intermittent     Equipment Recommendations  Rolling walker with 5" wheels    Recommendations for Other Services       Precautions / Restrictions Precautions Precautions: Fall;Other (comment) Precaution Comments: watch BP Restrictions Weight Bearing Restrictions: No    Mobility  Bed Mobility Overal bed mobility: Needs Assistance Bed Mobility: Supine to Sit     Supine to sit: HOB elevated;Min guard     General bed mobility comments: Increased time and effort to get to EOB with difficulty bringing LLE towards edge, 1 minor LOB posteriorly needing Min A to correct. Mild dizziness reported.  Transfers Overall transfer level: Needs assistance Equipment used: Rolling walker (2 wheeled) Transfers: Sit to/from Stand Sit to Stand: Min assist         General transfer comment: ASsist to power to standing and steady due to posterior bias. Transferred to chair.  Ambulation/Gait Ambulation/Gait assistance: Min  assist Gait Distance (Feet): 5 Feet Assistive device: Rolling walker (2 wheeled) Gait Pattern/deviations: Wide base of support;Trunk flexed Gait velocity: decr Gait velocity interpretation: <1.31 ft/sec, indicative of household ambulator General Gait Details: Able to take a few steps to get to chair with Min A for balance due to posterior bias and lean. Wide BoS and flexed trunk observed. + dizziness post transfer.   Stairs             Wheelchair Mobility    Modified Rankin (Stroke Patients Only)       Balance Overall balance assessment: Needs assistance Sitting-balance support: Feet supported;Single extremity supported Sitting balance-Leahy Scale: Fair Sitting balance - Comments: Min guard for safety. 1 initial LOB posteriorly Postural control: Posterior lean Standing balance support: During functional activity Standing balance-Leahy Scale: Poor Standing balance comment: Requires UE support in standing.                            Cognition Arousal/Alertness: Awake/alert Behavior During Therapy: WFL for tasks assessed/performed Overall Cognitive Status: Within Functional Limits for tasks assessed                                 General Comments: Repetition needed due to [facility]      Exercises      General Comments General comments (skin integrity, edema, etc.): Supine BP113/56, sitting BP 107/55, standing BP 83/45, sitting post transfer 99/52 with LEs elevated. Dizziness resolved. SP02 remained in  90s on 2L/min 02 [place]. HR up to 140s bpm with activity.      Pertinent Vitals/Pain Pain Assessment: Faces Faces Pain Scale: Hurts even more Pain Location: LLE Pain Descriptors / Indicators: Grimacing;Guarding;Burning Pain Intervention(s): Monitored during session;Repositioned;Limited activity within patient's tolerance;Patient requesting pain meds-RN notified    Home Living                      Prior Function            PT Goals  (current goals can now be found in the care plan section) Progress towards PT goals: Progressing toward goals    Frequency    Min 3X/week      PT Plan Current plan remains appropriate    Co-evaluation              AM-PAC PT "6 Clicks" Mobility   Outcome Measure  Help needed turning from your back to your side while in a flat bed without using bedrails?: None Help needed moving from lying on your back to sitting on the side of a flat bed without using bedrails?: A Little Help needed moving to and from a bed to a chair (including a wheelchair)?: A Little Help needed standing up from a chair using your arms (e.g., wheelchair or bedside chair)?: A Little Help needed to walk in hospital room?: A Little Help needed climbing 3-5 steps with a railing? : A Lot 6 Click Score: 18    End of Session Equipment Utilized During Treatment: Gait belt;Oxygen Activity Tolerance: Treatment limited secondary to medical complications (Comment) (orthostatic) Patient left: in chair;with call bell/phone within reach Nurse Communication: Mobility status;Patient requests pain meds PT Visit Diagnosis: Other abnormalities of gait and mobility (R26.89);Pain Pain - Right/Left: Left Pain - part of body: Leg     Time: 6948-5462 PT Time Calculation (min) (ACUTE ONLY): 25 min  Charges:  $Therapeutic Activity: 23-37 mins                     Vale Haven, PT, DPT Acute Rehabilitation Services Pager [phone] Office [phone]       Blake Divine A Tarra Pence 08/24/2020, 9:00 AM

## 2020-08-25 ENCOUNTER — Inpatient Hospital Stay (HOSPITAL_COMMUNITY): Payer: Medicare PPO

## 2020-08-25 LAB — BASIC METABOLIC PANEL
Anion gap: 8 (ref 5–15)
BUN: 29 mg/dL — ABNORMAL HIGH (ref 8–23)
CO2: 26 mmol/L (ref 22–32)
Calcium: 8.8 mg/dL — ABNORMAL LOW (ref 8.9–10.3)
Chloride: 107 mmol/L (ref 98–111)
Creatinine, Ser: 1.46 mg/dL — ABNORMAL HIGH (ref 0.61–1.24)
GFR, Estimated: 49 mL/min — ABNORMAL LOW (ref >60)
Glucose, Bld: 91 mg/dL (ref 70–99)
Potassium: 3.8 mmol/L (ref 3.5–5.1)
Sodium: 141 mmol/L (ref 135–145)

## 2020-08-25 LAB — TYPE AND SCREEN
ABO/RH(D): O POS
Antibody Screen: NEGATIVE
Unit division: 0

## 2020-08-25 LAB — CBC
HCT: 24.2 % — ABNORMAL LOW (ref 39.0–52.0)
Hemoglobin: 7.7 g/dL — ABNORMAL LOW (ref 13.0–17.0)
MCH: 27.1 pg (ref 26.0–34.0)
MCHC: 31.8 g/dL (ref 30.0–36.0)
MCV: 85.2 fL (ref 80.0–100.0)
Platelets: 66 10*3/uL — ABNORMAL LOW (ref 150–400)
RBC: 2.84 MIL/uL — ABNORMAL LOW (ref 4.22–5.81)
RDW: 15.9 % — ABNORMAL HIGH (ref 11.5–15.5)
WBC: 8 10*3/uL (ref 4.0–10.5)
nRBC: 0 % (ref 0.0–0.2)

## 2020-08-25 LAB — GLUCOSE, CAPILLARY
Glucose-Capillary: 87 mg/dL (ref 70–99)
Glucose-Capillary: 110 mg/dL — ABNORMAL HIGH (ref 70–99)
Glucose-Capillary: 175 mg/dL — ABNORMAL HIGH (ref 70–99)
Glucose-Capillary: 200 mg/dL — ABNORMAL HIGH (ref 70–99)

## 2020-08-25 LAB — BPAM RBC
Blood Product Expiration Date: 202202262359
ISSUE DATE / TIME: 202201280453
Unit Type and Rh: 5100

## 2020-08-25 NOTE — Progress Notes (Signed)
Mobility Specialist: Progress Note   08/25/20 1121  Mobility  Activity Ambulated in room  Level of Assistance Minimal assist, patient does 75% or more  Assistive Device Front wheel walker  Distance Ambulated (ft) 84 ft (28' then 56')  Mobility Response Tolerated well  Mobility performed by Mobility specialist  $Mobility charge 1 Mobility   Pre-Mobility:            Supine: 83 HR, 145/79 BP           Sitting: 125/65 BP           Standing: 127/41 BP Post-Mobility:             After 28': 135/78 (90) BP            After 56': 103/89 (96) BP  Pt ambulated in room from the bed to the door and back 3x. Pt took one seated break after first bout (28') and then went another 56'. Pt back to bed after session. Unable to get SpO2 reading during session, NT present. Pt requesting pain medication, RN notified.   [facility] [name] Mobility Specialist Mobility Specialist Phone: [phone]

## 2020-08-25 NOTE — Progress Notes (Signed)
Pt has been struggling to bread on 4l. SpO2 dropped to the 60s and stayed on 77. PRN breading treatment for SOB given, pt seems to get better but SpO2 dropped again. RRRN notified and he came at the bedside. After assessing and repositioning pt, RRRN switch pt on 6L HFNC.SpO2 is on 92 and >, we'll continue to monitor.

## 2020-08-25 NOTE — Progress Notes (Addendum)
Progress Note    08/25/2020 7:27 AM 3 Days Post-Op  Subjective:  Resting comfortably.  Says his breathing is better.  Still having a little pain in the left leg around the left calf near incision.  Says when he stands up for a bit, it gets better.   Tm 99.2 now afebrile HR 70's-90's  120's-170's  94% HFNC  Vitals:   08/25/20 0005 08/25/20 0605  BP: (!) 173/66 121/78  Pulse: 87 76  Resp: 18   Temp: 99.2 F (37.3 C) (!) 97.5 F (36.4 C)  SpO2: 91% 97%    Physical Exam: Cardiac:  Regular irregular Lungs:  Non labored on O2 Incisions:  All incisions healing nicely.  Some ecchymosis around left groin incision. Extremities:  Brisk left PT/DP and faint peroneal doppler signals  CBC    Component Value Date/Time   WBC 8.0 08/25/2020 0613   RBC 2.84 (L) 08/25/2020 0613   HGB 7.7 (L) 08/25/2020 0613   HGB 11.8 (L) 08/06/2020 1004   HGB 12.0 (L) 06/28/2019 1453   HCT 24.2 (L) 08/25/2020 0613   HCT 38.4 06/28/2019 1453   PLT 66 (L) 08/25/2020 0613   PLT 73 (L) 08/06/2020 1004   PLT 44 (LL) 06/28/2019 1453   MCV 85.2 08/25/2020 0613   MCV 89 06/28/2019 1453   MCH 27.1 08/25/2020 0613   MCHC 31.8 08/25/2020 0613   RDW 15.9 (H) 08/25/2020 0613   RDW 13.8 06/28/2019 1453   LYMPHSABS 1.2 08/06/2020 1004   LYMPHSABS 1.2 12/23/2016 1053   MONOABS 0.7 08/06/2020 1004   EOSABS 0.1 08/06/2020 1004   EOSABS 0.3 12/23/2016 1053   BASOSABS 0.0 08/06/2020 1004   BASOSABS 0.0 12/23/2016 1053   BMET    Component Value Date/Time   NA 141 08/25/2020 0613   NA 136 02/22/2020 1029   K 3.8 08/25/2020 0613   CL 107 08/25/2020 0613   CO2 26 08/25/2020 0613   GLUCOSE 91 08/25/2020 0613   BUN 29 (H) 08/25/2020 0613   BUN 28 (H) 02/22/2020 1029   CREATININE 1.46 (H) 08/25/2020 0613   CREATININE 1.17 04/13/2020 0915   CALCIUM 8.8 (L) 08/25/2020 0613   GFRNONAA 49 (L) 08/25/2020 0613   GFRNONAA >60 07/15/2018 1059   GFRAA 58 (L) 02/22/2020 1029   GFRAA >60 07/15/2018 1059       Intake/Output Summary (Last 24 hours) at 08/25/2020 0727 Last data filed at 08/25/2020 0600 Gross per 24 hour  Intake 315 ml  Output 1025 ml  Net -710 ml     Assessment:  80 y.o. male is s/p:  harvest of right great saphenous vein and then a left common femoral to PT bypass with nonreversedcontralateralsaphenous vein for occludedleftpopliteal aneurysm  3 Days Post-Op   Plan: -pt with patent bypass with brisk doppler signals left foot.  All incisions are healing nicely. -pt with decreased O2 sats overnight.  Pt breathing better this morning.  CXR ordered and pending.  He does have some irregular rhythm on monitor and hx of afib.  Will get EKG also.  -acute blood loss anemia-pt hgb down slightly today to 7.7 from 8.1.  Will continue to monitor.  He is a little dizzy when he gets up, but blood pressure is  tolerating.   -thrombocytopenia 66k this am-down slightly but stable overall -creatinine better this am at 1.46.  Continue to hold metformin for decreased BS and renal function.   -Will check CBC/BMET in am. -received staff message for recommendation to decrease Lantus  to 50U qhs-this was ordered.  BS this am 87. -DVT prophylaxis:  Sq heparin   Doreatha Massed, PA-C [facility] [phone] 08/25/2020 7:27 AM  VASCULAR STAFF ADDENDUM: I have independently interviewed and examined the patient. I agree with the above.  Looks good after femoral-tibial bypass for thrombosed popliteal aneurysm Increased oxygen requirement overnight, but looks good this AM. CXR without effusion or infiltrate. Hgb drift to 7.7. Observe. AKI improving. Monitor. PT/OT/OOB/Ambulate. Hopefully will be ready for discharge Monday / Tuesday.  Rande Brunt. Lenell Antu, MD [facility] Phone Number: [phone] 08/25/2020 10:10 AM

## 2020-08-25 NOTE — Evaluation (Signed)
Occupational Therapy Evaluation Patient Details Name: [name] MRN: [id number] DOB: 1941/01/04 Today's Date: 08/25/2020    History of Present Illness Pt adm for left fem pop bypass on 1/26 for thrombosed lt popliteal aneurysm. PMH - copd on home O2, HTN, CAD, aorto biiliac bypass for AAA, pacer, DM.   Clinical Impression   Patient admitted for the diagnosis and procedure above.  R leg pain is improving, and he is moving the leg better.  Tender to the groin and L calf incision.  He plans on returning home, his daughter will be staying with him 24/7 for the first few days.  He is not interested in [facility] therapies so none will be recommended.  Continue to follow him in the acute setting so OT can maximize his functional status for eventual return home.      Follow Up Recommendations  No OT follow up    Equipment Recommendations  None recommended by OT    Recommendations for Other Services       Precautions / Restrictions Precautions Precautions: Fall;Other (comment) Precaution Comments: watch BP      Mobility Bed Mobility Overal bed mobility: Modified Independent Bed Mobility: Supine to Sit;Sit to Supine     Supine to sit: HOB elevated;Modified independent (Device/Increase time) Sit to supine: Modified independent (Device/Increase time);HOB elevated   General bed mobility comments: lifting and moving L leg much better.    Transfers Overall transfer level: Needs assistance Equipment used: Rolling walker (2 wheeled) Transfers: Sit to/from Stand Sit to Stand: Supervision;From elevated surface              Balance Overall balance assessment: Needs assistance Sitting-balance support: Feet supported;Single extremity supported Sitting balance-Leahy Scale: Good     Standing balance support: Bilateral upper extremity supported Standing balance-Leahy Scale: Poor Standing balance comment: Requires UE support in standing.                           ADL  either performed or assessed with clinical judgement   ADL Overall ADL's : Needs assistance/impaired Eating/Feeding: Independent;Sitting   Grooming: Wash/dry hands;Wash/dry face;Modified independent;Standing   Upper Body Bathing: Set up;Sitting   Lower Body Bathing: Minimal assistance;Sit to/from stand   Upper Body Dressing : Set up;Sitting   Lower Body Dressing: Sit to/from stand;Minimal assistance   Toilet Transfer: Supervision/safety;RW           Functional mobility during ADLs: Supervision/safety;Rolling walker       Vision Baseline Vision/History: Wears glasses Patient Visual Report: No change from baseline       Perception     Praxis      Pertinent Vitals/Pain Pain Assessment: Faces Faces Pain Scale: Hurts even more Pain Location: LLE Pain Descriptors / Indicators: Grimacing;Guarding;Tender Pain Intervention(s): Monitored during session     Hand Dominance Right   Extremity/Trunk Assessment Upper Extremity Assessment Upper Extremity Assessment: Overall WFL for tasks assessed   Lower Extremity Assessment Lower Extremity Assessment: Defer to PT evaluation   Cervical / Trunk Assessment Cervical / Trunk Assessment: Normal   Communication Communication Communication: No difficulties   Cognition Arousal/Alertness: Awake/alert Behavior During Therapy: WFL for tasks assessed/performed Overall Cognitive Status: Within Functional Limits for tasks assessed                                     General Comments       Exercises  Shoulder Instructions      Home Living Family/patient expects to be discharged to:: Private residence Living Arrangements: Alone Available Help at Discharge: Family;Available PRN/intermittently Type of Home: House Home Access: Level entry     Home Layout: One level     Bathroom Shower/Tub: Chief Strategy Officer: Handicapped height     Home Equipment: Cane - single point;Cane - quad;Grab  bars - tub/shower   Additional Comments: daughter will be staying with him 24/7 for the first few days      Prior Functioning/Environment Level of Independence: Independent with assistive device(s)        Comments: Uses cane, shower chair        OT Problem List: Decreased activity tolerance;Impaired balance (sitting and/or standing);Decreased knowledge of use of DME or AE;Pain      OT Treatment/Interventions: Self-care/ADL training;Energy conservation;Balance training;Therapeutic activities    OT Goals(Current goals can be found in the care plan section) Acute Rehab OT Goals Patient Stated Goal: return home and progress from walker OT Goal Formulation: With patient Time For Goal Achievement: 09/08/20 Potential to Achieve Goals: Good ADL Goals Pt Will Perform Lower Body Bathing: with modified independence;sit to/from stand Pt Will Perform Lower Body Dressing: with modified independence;sit to/from stand Pt Will Transfer to Toilet: with modified independence;ambulating Pt Will Perform Toileting - Clothing Manipulation and hygiene: with modified independence;sit to/from stand  OT Frequency: Min 2X/week   Barriers to D/C:    none       Co-evaluation              AM-PAC OT "6 Clicks" Daily Activity     Outcome Measure Help from another person eating meals?: None Help from another person taking care of personal grooming?: None Help from another person toileting, which includes using toliet, bedpan, or urinal?: A Little Help from another person bathing (including washing, rinsing, drying)?: A Little Help from another person to put on and taking off regular upper body clothing?: None Help from another person to put on and taking off regular lower body clothing?: A Little 6 Click Score: 21   End of Session Equipment Utilized During Treatment: Rolling walker;Oxygen Nurse Communication: Mobility status  Activity Tolerance: Patient tolerated treatment well Patient left:  in bed;with call bell/phone within reach;with nursing/sitter in room;with family/visitor present  OT Visit Diagnosis: Unsteadiness on feet (R26.81);Pain Pain - Right/Left: Left Pain - part of body: Leg                Time: 1448-1510 OT Time Calculation (min): 22 min Charges:  OT General Charges $OT Visit: 1 Visit OT Evaluation $OT Eval Moderate Complexity: 1 Mod  08/25/2020  Rich, OTR/L  Acute Rehabilitation Services  Office:  [phone]   Suzanna Obey 08/25/2020, 3:48 PM

## 2020-08-26 LAB — CBC
HCT: 26.2 % — ABNORMAL LOW (ref 39.0–52.0)
Hemoglobin: 7.9 g/dL — ABNORMAL LOW (ref 13.0–17.0)
MCH: 26.4 pg (ref 26.0–34.0)
MCHC: 30.2 g/dL (ref 30.0–36.0)
MCV: 87.6 fL (ref 80.0–100.0)
Platelets: 73 10*3/uL — ABNORMAL LOW (ref 150–400)
RBC: 2.99 MIL/uL — ABNORMAL LOW (ref 4.22–5.81)
RDW: 16 % — ABNORMAL HIGH (ref 11.5–15.5)
WBC: 7.2 10*3/uL (ref 4.0–10.5)
nRBC: 0 % (ref 0.0–0.2)

## 2020-08-26 LAB — GLUCOSE, CAPILLARY
Glucose-Capillary: 74 mg/dL (ref 70–99)
Glucose-Capillary: 68 mg/dL — ABNORMAL LOW (ref 70–99)
Glucose-Capillary: 123 mg/dL — ABNORMAL HIGH (ref 70–99)
Glucose-Capillary: 65 mg/dL — ABNORMAL LOW (ref 70–99)
Glucose-Capillary: 285 mg/dL — ABNORMAL HIGH (ref 70–99)
Glucose-Capillary: 217 mg/dL — ABNORMAL HIGH (ref 70–99)

## 2020-08-26 LAB — BASIC METABOLIC PANEL
Anion gap: 8 (ref 5–15)
BUN: 23 mg/dL (ref 8–23)
CO2: 28 mmol/L (ref 22–32)
Calcium: 8.9 mg/dL (ref 8.9–10.3)
Chloride: 107 mmol/L (ref 98–111)
Creatinine, Ser: 1.22 mg/dL (ref 0.61–1.24)
GFR, Estimated: >60 mL/min (ref >60)
Glucose, Bld: 104 mg/dL — ABNORMAL HIGH (ref 70–99)
Potassium: 4 mmol/L (ref 3.5–5.1)
Sodium: 143 mmol/L (ref 135–145)

## 2020-08-26 MED ORDER — GABAPENTIN 100 MG PO CAPS
100.0000 mg | ORAL_CAPSULE | Freq: Two times a day (BID) | ORAL | Status: DC
  Administered 2020-08-26 – 2020-08-27 (×3): 100 mg via ORAL
  Filled 2020-08-26 (×3): qty 1

## 2020-08-26 NOTE — Progress Notes (Signed)
Mobility Specialist: Progress Note   08/26/20 1456  Mobility  Activity Ambulated in hall  Level of Assistance Minimal assist, patient does 75% or more  Assistive Device Front wheel walker  Distance Ambulated (ft) 410 ft  Mobility Response Tolerated well  Mobility performed by Mobility specialist  $Mobility charge 1 Mobility   Pre-Mobility on 3 L/min [place]: 84 HR, 128/64 BP, 100% SpO2 During Mobility on 4 L/min [place]: 103 HR Post-Mobility on 3 L/min [place]: 81 HR, 135/57 BP, 96% SpO2  Pt took one seated break lasting <1 min due to c/o pain in RLE during ambulation, no rating given. Pt visibly SOB during ambulation, unable to get SpO2 reading though. Pt's O2 flow was increased to 4 L/min for remainder of walk. Pt back on 3 L/min [place] in room. Pt is sitting EOB with call bell and phone at his side.   [facility] [name] Mobility Specialist Mobility Specialist Phone: [phone]

## 2020-08-26 NOTE — Progress Notes (Addendum)
Progress Note    08/26/2020 8:25 AM 4 Days Post-Op  Subjective:  C/o burning in the left calf  Afebrile HR 70's-80's  150's-160's systolic 91% 4LO2NC   Vitals:   08/26/20 0602 08/26/20 0819  BP: (!) 156/77 (!) 160/75  Pulse: 82 85  Resp: 16 18  Temp: 98 F (36.7 C) 97.7 F (36.5 C)  SpO2: 90% 91%    Physical Exam: Cardiac:  Regular Lungs:  Non labored on 4LO2NC Incisions:  All incisions are healing nicely Extremities:  Brisk doppler signals left DP/PT   CBC    Component Value Date/Time   WBC 7.2 08/26/2020 0314   RBC 2.99 (L) 08/26/2020 0314   HGB 7.9 (L) 08/26/2020 0314   HGB 11.8 (L) 08/06/2020 1004   HGB 12.0 (L) 06/28/2019 1453   HCT 26.2 (L) 08/26/2020 0314   HCT 38.4 06/28/2019 1453   PLT 73 (L) 08/26/2020 0314   PLT 73 (L) 08/06/2020 1004   PLT 44 (LL) 06/28/2019 1453   MCV 87.6 08/26/2020 0314   MCV 89 06/28/2019 1453   MCH 26.4 08/26/2020 0314   MCHC 30.2 08/26/2020 0314   RDW 16.0 (H) 08/26/2020 0314   RDW 13.8 06/28/2019 1453   LYMPHSABS 1.2 08/06/2020 1004   LYMPHSABS 1.2 12/23/2016 1053   MONOABS 0.7 08/06/2020 1004   EOSABS 0.1 08/06/2020 1004   EOSABS 0.3 12/23/2016 1053   BASOSABS 0.0 08/06/2020 1004   BASOSABS 0.0 12/23/2016 1053    BMET    Component Value Date/Time   NA 143 08/26/2020 0314   NA 136 02/22/2020 1029   K 4.0 08/26/2020 0314   CL 107 08/26/2020 0314   CO2 28 08/26/2020 0314   GLUCOSE 104 (H) 08/26/2020 0314   BUN 23 08/26/2020 0314   BUN 28 (H) 02/22/2020 1029   CREATININE 1.22 08/26/2020 0314   CREATININE 1.17 04/13/2020 0915   CALCIUM 8.9 08/26/2020 0314   GFRNONAA >60 08/26/2020 0314   GFRNONAA >60 07/15/2018 1059   GFRAA 58 (L) 02/22/2020 1029   GFRAA >60 07/15/2018 1059    INR    Component Value Date/Time   INR 1.1 08/21/2020 1051     Intake/Output Summary (Last 24 hours) at 08/26/2020 0825 Last data filed at 08/26/2020 0100 Gross per 24 hour  Intake -  Output 1025 ml  Net -1025 ml      Assessment:  80 y.o. male is s/p:  harvest of right great saphenous vein and then a left common femoral to PT bypass with nonreversedcontralateralsaphenous vein for occludedleftpopliteal aneurysm   4 Days Post-Op  Plan: -pt with brisk doppler signals left DP/PT -pt states his breathing is doing ok and at his baseline.   -he c/o burning in his calf while laying in bed.  His calf is soft and non tender to palpation.  May have some nerve irritation.  May benefit from Neurontin.  Will d/w Dr. Lenell Antu.  Discussed with MD and we will start Neurontin 100mg  bid given the sedating effects and titrate up if needed.  -acute blood loss anemia-stable and slightly improved from yesterday -renal function continues to improve with creatinine of 1.22 today. -thrombocytopenia also stable and slightly better today -DVT prophylaxis:  Sq heparin   , PA-C [facility] [phone] 08/26/2020 8:25 AM  VASCULAR STAFF ADDENDUM: I have independently interviewed and examined the patient. I agree with the above.  Globally improving. Start Neurontin.  08/28/2020. Rande Brunt, MD [facility] Phone Number: [phone] 08/26/2020 11:52 AM

## 2020-08-27 LAB — GLUCOSE, CAPILLARY: Glucose-Capillary: 94 mg/dL (ref 70–99)

## 2020-08-27 MED ORDER — OXYCODONE-ACETAMINOPHEN 5-325 MG PO TABS: 1.0000 | ORAL_TABLET | Freq: Four times a day (QID) | ORAL | 0 refills | Status: DC | PRN

## 2020-08-27 MED ORDER — GABAPENTIN 100 MG PO CAPS: 100.0000 mg | ORAL_CAPSULE | Freq: Two times a day (BID) | ORAL | 0 refills | Status: DC

## 2020-08-27 NOTE — Discharge Instructions (Signed)
 [facility] [place]  Discharge instructions  Lower Extremity Bypass Surgery  Please refer to the following instruction for your post-procedure care. Your surgeon or physician assistant will discuss any changes with you.  Activity  You are encouraged to walk as much as you can. You can slowly return to normal activities during the month after your surgery. Avoid strenuous activity and heavy lifting until your doctor tells you it's OK. Avoid activities such as vacuuming or swinging a golf club. Do not drive until your doctor give the OK and you are no longer taking prescription pain medications. It is also normal to have difficulty with sleep habits, eating and bowel movement after surgery. These will go away with time.  Bathing/Showering  You may shower after you go home. Do not soak in a bathtub, hot tub, or swim until the incision heals completely.  Incision Care  Clean your incision with mild soap and water. Shower every day. Pat the area dry with a clean towel. You do not need a bandage unless otherwise instructed. Do not apply any ointments or creams to your incision. If you have open wounds you will be instructed how to care for them or a visiting nurse may be arranged for you. If you have staples or sutures along your incision they will be removed at your post-op appointment. You may have skin glue on your incision. Do not peel it off. It will come off on its own in about one week. If you have a great deal of moisture in your groin, use a gauze help keep this area dry.  Diet  Resume your normal diet. There are no special food restrictions following this procedure. A low fat/ low cholesterol diet is recommended for all patients with vascular disease. In order to heal from your surgery, it is CRITICAL to get adequate nutrition. Your body requires vitamins, minerals, and protein. Vegetables are the best source of vitamins and minerals. Vegetables also provide the  perfect balance of protein. Processed food has little nutritional value, so try to avoid this.  Medications  Resume taking all your medications unless your doctor or nurse practitioner tells you not to. If your incision is causing pain, you may take over-the-counter pain relievers such as acetaminophen (Tylenol). If you were prescribed a stronger pain medication, please aware these medication can cause nausea and constipation. Prevent nausea by taking the medication with a snack or meal. Avoid constipation by drinking plenty of fluids and eating foods with high amount of fiber, such as fruits, vegetables, and grains. Take Colase 100 mg (an over-the-counter stool softener) twice a day as needed for constipation. Do not take Tylenol if you are taking prescription pain medications.  Follow Up  Our office will schedule a follow up appointment 2-3 weeks following discharge.  Please call us immediately for any of the following conditions  Severe or worsening pain in your legs or feet while at rest or while walking Increase pain, redness, warmth, or drainage (pus) from your incision site(s) Fever of 101 degree or higher The swelling in your leg with the bypass suddenly worsens and becomes more painful than when you were in the hospital If you have been instructed to feel your graft pulse then you should do so every day. If you can no longer feel this pulse, call the office immediately. Not all patients are given this instruction.  Leg swelling is common after leg bypass surgery.  The swelling should improve over a few months   following surgery. To improve the swelling, you may elevate your legs above the level of your heart while you are sitting or resting. Your surgeon or physician assistant may ask you to apply an ACE wrap or wear compression (TED) stockings to help to reduce swelling.  Reduce your risk of vascular disease  Stop smoking. If you would like help call [facility] at 1-800-QUIT-NOW  (1-800-784-8669) or [place] at 336-586-4000.  Manage your cholesterol Maintain a desired weight Control your diabetes weight Control your diabetes Keep your blood pressure down  If you have any questions, please call the office at [phone]   

## 2020-08-27 NOTE — Care Management (Signed)
[id number] 08-27-20 Case Manager spoke with patient regarding transition home. Prior to arrival patient was from home. Patient will have assistance of daughter once he goes home. Patient states daughter does not work and will stay with him. Patient has a rolling walker in the home and states he will not need home health services at this time. He has used agencies in the past. Patient states daughter is getting him a toilet riser. Daughter will provide transportation home via private vehicle. [place], Lamar Laundry, RN, BSN Case Manager

## 2020-08-27 NOTE — Progress Notes (Signed)
Occupational Therapy Treatment Patient Details Name: [name] MRN: [id number] DOB: 11-12-1940 Today's Date: 08/27/2020    History of present illness Pt adm for left fem pop bypass on 1/26 for thrombosed lt popliteal aneurysm. PMH - copd on home O2, HTN, CAD, aorto biiliac bypass for AAA, pacer, DM.   OT comments  Pt seen earlier today prior to d/c for donning of new clothes with minguardA overall at EOB. Pt feels that he will continue to get better with the help of his daughter and does not want HH therapies. Pt would benefit from continued OT skilled services. OT following acutely.   Follow Up Recommendations       Equipment Recommendations  None recommended by OT    Recommendations for Other Services      Precautions / Restrictions Precautions Precautions: Fall;Other (comment) Precaution Comments: watch BP Restrictions Weight Bearing Restrictions: No       Mobility Bed Mobility Overal bed mobility: Modified Independent                Transfers Overall transfer level: Needs assistance Equipment used: Rolling walker (2 wheeled) Transfers: Sit to/from Stand Sit to Stand: Supervision;From elevated surface              Balance Overall balance assessment: Needs assistance Sitting-balance support: Feet supported;Single extremity supported Sitting balance-Leahy Scale: Good     Standing balance support: Bilateral upper extremity supported Standing balance-Leahy Scale: Poor Standing balance comment: Requires UE support in standing.                           ADL either performed or assessed with clinical judgement   ADL Overall ADL's : Needs assistance/impaired                 Upper Body Dressing : Set up;Sitting   Lower Body Dressing: Min guard;Sitting/lateral leans;Sit to/from stand Lower Body Dressing Details (indicate cue type and reason): standing at EOB             Functional mobility during ADLs:  Supervision/safety;Rolling walker General ADL Comments: Pt donning clothes for ride home at D/C. Pt taking his time and performing without physical assist. Pt requiring assist to use cell phone due to tremors in hands or nervousness with cell phone.     Vision   Vision Assessment?: No apparent visual deficits   Perception     Praxis      Cognition Arousal/Alertness: Awake/alert Behavior During Therapy: WFL for tasks assessed/performed Overall Cognitive Status: Within Functional Limits for tasks assessed                                          Exercises     Shoulder Instructions       General Comments O2 >90% on 3-4L    Pertinent Vitals/ Pain       Pain Assessment: No/denies pain  Home Living                                          Prior Functioning/Environment              Frequency  Min 2X/week        Progress Toward Goals  OT Goals(current goals can now be found in  the care plan section)  Progress towards OT goals: Progressing toward goals  Acute Rehab OT Goals Patient Stated Goal: return home and progress from walker OT Goal Formulation: With patient Time For Goal Achievement: 09/08/20 Potential to Achieve Goals: Good ADL Goals Pt Will Perform Lower Body Bathing: with modified independence;sit to/from stand Pt Will Perform Lower Body Dressing: with modified independence;sit to/from stand Pt Will Transfer to Toilet: with modified independence;ambulating Pt Will Perform Toileting - Clothing Manipulation and hygiene: with modified independence;sit to/from stand  Plan Discharge plan remains appropriate    Co-evaluation                 AM-PAC OT "6 Clicks" Daily Activity     Outcome Measure   Help from another person eating meals?: None Help from another person taking care of personal grooming?: None Help from another person toileting, which includes using toliet, bedpan, or urinal?: A Little Help from  another person bathing (including washing, rinsing, drying)?: A Little Help from another person to put on and taking off regular upper body clothing?: None Help from another person to put on and taking off regular lower body clothing?: A Little 6 Click Score: 21    End of Session Equipment Utilized During Treatment: Rolling walker;Oxygen  OT Visit Diagnosis: Unsteadiness on feet (R26.81);Pain Pain - Right/Left: Left Pain - part of body: Leg   Activity Tolerance Patient tolerated treatment well   Patient Left in bed;with call bell/phone within reach;with nursing/sitter in room   Nurse Communication Mobility status        Time: 3716-9678 OT Time Calculation (min): 22 min  Charges: OT General Charges $OT Visit: 1 Visit OT Treatments $Self Care/Home Management : 8-22 mins  Flora Lipps, OTR/L Acute Rehabilitation Services Pager: [phone] Office: [phone]    Rotha Cassels C 08/27/2020, 5:29 PM

## 2020-08-27 NOTE — Progress Notes (Addendum)
[facility]  Subjective  - Doing well and wants to go home.   Objective 132/68 76 98.2 F (36.8 C) (Oral) 20 100%  Intake/Output Summary (Last 24 hours) at 08/27/2020 0734 Last data filed at 08/27/2020 0308 Gross per 24 hour  Intake --  Output 900 ml  Net -900 ml   Lungs non labored breathing stable on O2 at baseline for 24 hour O2 use. B LE incisions healing well with ecchymosis on the left LE medial thigh.  Comparments are soft Doppler signals brisk B LE DP/PT   Assessment/Planning: POD # 5 80 y.o. male is s/p:  harvest of right great saphenous vein and then a left common femoral to PT bypass with nonreversedcontralateralsaphenous vein for occludedleftpopliteal aneurysm   Plan to discharge today on 100 mg Neurontin BID for 30 days.   His O2 requirements are at baseline.   F/U with Dr. Chestine Spore in 2-3 weeks. Acute blood loss anemia stable at 7.9    [facility] 08/27/2020 7:34 AM --  Laboratory Lab Results: Recent Labs    08/25/20 0613 08/26/20 0314  WBC 8.0 7.2  HGB 7.7* 7.9*  HCT 24.2* 26.2*  PLT 66* 73*   BMET Recent Labs    08/25/20 0613 08/26/20 0314  NA 141 143  K 3.8 4.0  CL 107 107  CO2 26 28  GLUCOSE 91 104*  BUN 29* 23  CREATININE 1.46* 1.22  CALCIUM 8.8* 8.9    COAG Lab Results  Component Value Date   INR 1.1 08/21/2020   INR 1.2 03/15/2019   INR 1.07 09/01/2017   No results found for: PTT  I have seen and evaluated the patient. I agree with the PA note as documented above.  80 year old male now postop day 5 status post left common femoral to PT bypass for occluded popliteal aneurysm.  He has excellent PT signal on exam.  Neurontin was started over the weekend for some calf pain that is improved.  He is on his baseline oxygen requirements of 2 to 3 L for COPD.  His AKI resolved over the weekend.  Plan discharge today.  Follow-up in 2 to 3 weeks for incision check.  Cephus Shelling,  MD [facility] [place] [facility] [place] Office: [phone]

## 2020-08-27 NOTE — Progress Notes (Signed)
Discharge instructions (including medications) discussed with and copy provided to patient/caregiver 

## 2020-08-28 NOTE — Discharge Summary (Signed)
[facility] Discharge Summary   Patient ID:  [name] MRN: [id number] DOB/AGE: 1940/10/05 80 y.o.  Admit date: 08/22/2020 Discharge date: 08/27/20 Date of Surgery: 08/22/2020 Surgeon: Surgeon(s): Cephus Shelling, MD Maeola Harman, MD  Admission Diagnosis: Popliteal aneurysm [facility]) [I72.4]  Discharge Diagnoses:  Popliteal aneurysm [facility]) [I72.4]  Secondary Diagnoses: Past Medical History:  Diagnosis Date  . 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 Anxiety   . Aortic aneurysm (HCC) 09/02/2017  . BENIGN PROSTATIC HYPERTROPHY 11/23/2009  . Cardiomyopathy (HCC) 08/28/2016  . Chronic systolic CHF (congestive heart failure) (HCC) 09/02/2017  . COPD (chronic obstructive pulmonary disease) (HCC)   . CORONARY ARTERY DISEASE 11/23/2009  . 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 DEGENERATIVE JOINT DISEASE 11/23/2009  . DEPRESSION 11/23/2009  . FATIGUE 11/23/2009  . GAIT DISTURBANCE 12/10/2009  . HEMOPTYSIS UNSPECIFIED 05/07/2010  . High cholesterol   . HYPERTENSION 07/30/2009  . HYPOTHYROIDISM 07/30/2009  . Ischemic cardiomyopathy 09/02/2017  . 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 On home oxygen therapy    "2-3L; 24/7" (09/10/2016)  . OSA on CPAP   . Pneumonia   . PTSD (post-traumatic stress disorder) 03/10/2012  . 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 RA (rheumatoid arthritis) (HCC) 06/11/2011   "qwhere" (09/10/2016)  . RESPIRATORY FAILURE, CHRONIC 07/31/2009  . Scleritis of both eyes 03/17/2014  . Thrombocytopenia (HCC)   . 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 Type II diabetes mellitus (HCC)     Procedure(s): LEFT FEMORAL-POSTERIOR TIBIAL ARTERY BYPASS GRAFT USING RIGHT GREATER NONREVERSED SAPHENOUS VEIN GRAFT EXCLUSION OF LEFT POPLITEAL ARTERY ANEURYSM  Discharged Condition: stable  HPI: [name] a 80 y.o.male,with history of COPD on 2 L home oxygen, hypertension, hyperlipidemia, coronary artery disease status post right coronary stentin2018,  history of aorto bi-iliac bypass for AAAthatpresents for evaluation of thrombosed left popliteal artery aneurysm. Patient states he was in his normal state of health until about Thanksgiving when he started noticing increasing pain in the left leg.   Lower extremity arterial duplex from 07/31/2020 shows a 2.8 cm right popliteal artery aneurysm and a thrombosed 2.5 cm left popliteal artery aneurysm.  Plan for fem-pop with vein harvest from the opposite leg.  Hospital Course:  [name] is a 80 y.o. male is S/P  LEFT FEMORAL-POSTERIOR TIBIAL ARTERY BYPASS GRAFT USING RIGHT GREATER NONREVERSED SAPHENOUS VEIN GRAFT EXCLUSION OF LEFT POPLITEAL ARTERY ANEURYSM Post op he had fluctuating CR that increased to 1.87 and now is back to baseline.  His lung function has returned to baseline with O2 support.    He is ambulatory and will have HH PT to regain his independence.  Incisions are all healing well and groin is without hematoma.  Doppler signals left DP/PT are brisk.  He had post op blood loss anemia that required 1 unit transfusion prior to discharge.    Stable for discharge POD# 5.  Significant Diagnostic Studies: CBC Lab Results  Component Value Date   WBC 7.2 08/26/2020   HGB 7.9 (L) 08/26/2020   HCT 26.2 (L) 08/26/2020   MCV 87.6 08/26/2020   PLT 73 (L) 08/26/2020    BMET    Component Value Date/Time   NA 143 08/26/2020 0314   NA 136 02/22/2020 1029   K 4.0 08/26/2020 0314   CL 107 08/26/2020 0314   CO2 28 08/26/2020 0314   GLUCOSE 104 (H) 08/26/2020 0314   BUN 23 08/26/2020 0314   BUN 28 (H) 02/22/2020 1029   CREATININE 1.22 08/26/2020  6553   CREATININE 1.17 04/13/2020 0915   CALCIUM 8.9 08/26/2020 0314   GFRNONAA >60 08/26/2020 0314   GFRNONAA >60 07/15/2018 1059   GFRAA 58 (L) 02/22/2020 1029   GFRAA >60 07/15/2018 1059   COAG Lab Results  Component Value Date   INR 1.1 08/21/2020   INR 1.2 03/15/2019   INR 1.07 09/01/2017     Disposition:  Discharge to  :Home Discharge Instructions    Call MD for:  redness, tenderness, or signs of infection (pain, swelling, bleeding, redness, odor or green/yellow discharge around incision site)   Complete by: As directed    Call MD for:  severe or increased pain, loss or decreased feeling  in affected limb(s)   Complete by: As directed    Call MD for:  temperature >100.5   Complete by: As directed    Resume previous diet   Complete by: As directed      Allergies as of 08/27/2020   No Known Allergies     Medication List    TAKE these medications   albuterol (2.5 MG/3ML) 0.083% nebulizer solution Commonly known as: PROVENTIL 1 vial in nebulizer every 6 hours as needed Dx 496 What changed:   how much to take  how to take this  when to take this  reasons to take this  additional instructions   ALPRAZolam 0.5 MG tablet Commonly known as: XANAX Take 0.5 mg by mouth at bedtime.   atorvastatin 40 MG tablet Commonly known as: Lipitor Take 1 tablet (40 mg total) by mouth daily.   BIOFREEZE EX Apply 1 application topically 3 (three) times daily as needed (pain. (calf muscle)).   empagliflozin 10 MG Tabs tablet Commonly known as: JARDIANCE Take 5 mg by mouth daily. Filled at [facility].   furosemide 40 MG tablet Commonly known as: LASIX Take 1 tablet (40 mg total) by mouth daily. What changed:   when to take this  reasons to take this   gabapentin 100 MG capsule Commonly known as: Neurontin Take 1 capsule (100 mg total) by mouth 2 (two) times daily.   insulin glargine 100 UNIT/ML injection Commonly known as: LANTUS Inject 60 Units into the skin at bedtime.   levothyroxine 150 MCG tablet Commonly known as: SYNTHROID Take 150 mcg by mouth daily before breakfast.   losartan 50 MG tablet Commonly known as: COZAAR Take 1 tablet (50 mg total) by mouth daily.   LUBRICATING EYE DROPS OP Place 1 drop into both eyes 4 (four) times daily as needed (dry eyes).   MEGA MULTIVITAMIN FOR MEN  PO Take 1 tablet by mouth daily.   metFORMIN 500 MG tablet Commonly known as: GLUCOPHAGE Take 500-1,000 mg by mouth 2 (two) times daily with a meal. Take 1 tablet (500 mg) by mouth in the morning & take 2 tablets (1000 mg) by mouth in the evening.   Olodaterol HCl 2.5 MCG/ACT Aers Commonly known as: Striverdi Respimat Inhale 1 puff into the lungs daily. What changed: when to take this   oxyCODONE-acetaminophen 5-325 MG tablet Commonly known as: PERCOCET/ROXICET Take 1 tablet by mouth every 6 (six) hours as needed for severe pain.   OXYGEN Inhale 2 L into the lungs continuous.   OZEMPIC (1 MG/DOSE) [place] Inject 1 mg into the skin every Friday.   predniSONE 5 MG tablet Commonly known as: DELTASONE Take 1 tablet (5 mg total) by mouth daily with breakfast.   tamsulosin 0.4 MG Caps capsule Commonly known as: FLOMAX Take 1 capsule (0.4  mg total) by mouth at bedtime.   tiotropium 18 MCG inhalation capsule Commonly known as: Spiriva HandiHaler Place 1 capsule (18 mcg total) into inhaler and inhale daily.   Vitamin D3 50 MCG (2000 UT) capsule Take 2,000 Units by mouth daily.      Verbal and written Discharge instructions given to the patient. Wound care per Discharge AVS  Follow-up Information    Cephus Shelling, MD Follow up in 2 week(s).   Specialty: Vascular Surgery Contact information: [address] [place] [address] [phone]               Signed: Mosetta Pigeon 08/28/2020, 10:49 AM - For VQI Registry use --- Instructions: Press F2 to tab through selections.  Delete question if not applicable.   Post-op:  Wound infection: No  Graft infection: No  Transfusion: Yes  If yes, 1 units given New Arrhythmia: No Ipsilateral amputation: [ ]  no, [ ]  Minor, [ ]  BKA, [ ]  AKA Discharge patency: [ ]  Primary, [ ]  Primary assisted, [ ]  Secondary, [ ]  Occluded Patency judged by: [ ]  Dopper only, [ ]  Palpable graft pulse, [ ]  Palpable distal pulse, [ ]  ABI  inc. > 0.15, [ ]  Duplex  D/C Ambulatory Status: Ambulatory with Assistance  Complications: MI: [x ] No, [ ]  Troponin only, [ ]  EKG or Clinical CHF: No Resp failure: [ ]  none, [ ]  Pneumonia, [ ]  Ventilator [x]  home O2 baseline Chg in renal function: [ ]  none, [x ] Inc. Cr > 0.5, [ ]  Temp. Dialysis, [ ]  Permanent dialysis Stroke: [x ] None, [ ]  Minor, [ ]  Major Return to OR: No  Reason for return to OR: [ ]  Bleeding, [ ]  Infection, [ ]  Thrombosis, [ ]  Revision  Discharge medications: Statin use:  Yes ASA use:  No  for medical reason   Plavix use:  No  for medical reason   Beta blocker use: No  for medical reason   Coumadin use: No  for medical reason

## 2020-08-29 ENCOUNTER — Telehealth: Payer: Self-pay

## 2020-08-29 NOTE — Telephone Encounter (Signed)
Patient called to report swelling in the left leg. He is less than one week out from bypass surgery. He says he is having no problems, just lots of swelling. Can move leg, denies color or temperature change, says incisions look fine - not red, hot or draining. Has a small fluid blister on side of left ankle. Advised that swelling was normal for this type of surgery and not to pop or otherwise bother the blister but monitor. He knows to call back if further issues arise.

## 2020-09-03 ENCOUNTER — Telehealth: Payer: Self-pay | Admitting: *Deleted

## 2020-09-03 ENCOUNTER — Other Ambulatory Visit: Payer: Self-pay | Admitting: Physician Assistant

## 2020-09-03 MED ORDER — OXYCODONE-ACETAMINOPHEN 5-325 MG PO TABS
1.0000 | ORAL_TABLET | Freq: Four times a day (QID) | ORAL | 0 refills | Status: DC | PRN
Start: 2020-09-03 — End: 2020-10-04

## 2020-09-03 NOTE — Telephone Encounter (Signed)
Patient called stating that he had suffered a fall yesterday.  He states that his incisions from surgery last week appear fine, only feeling "sore all over".  Patient was requesting additional pain meds to last him 3-4 days.  His daughter will be staying with him again to assist him with his needs.  Aggie Moats  PA sent RX.

## 2020-09-07 ENCOUNTER — Telehealth: Payer: Self-pay | Admitting: Cardiology

## 2020-09-07 NOTE — Telephone Encounter (Signed)
Pt c/o medication issue:  1. Name of Medication: Eliquis 5 mg   2. How are you currently taking this medication (dosage and times per day)? Patient has not started yet   3. Are you having a reaction (difficulty breathing--STAT)? no  4. What is your medication issue? Patient recently got a refill of this medication from the pharmacy. It was not on his med list when he was discharged from the hospital. HE just wanted to know if he should be on the medication or not. Please advise

## 2020-09-07 NOTE — Telephone Encounter (Signed)
Patient calling in to clarify if he should be on Eliquis as when his daughter picked up his refills and medications he received a 3 month supply of Eliquis. Per chart review Eliquis is not an active medication and was discontinued by Dr. Pamelia Hoit on 05/14/20 per patient report that it had been discontinued. 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 Per last hospital DC summary Eliquis was not restarted then either. Will send to Dr. Jens Som to clarify.

## 2020-09-07 NOTE — Telephone Encounter (Signed)
Spoke with pt, Aware of dr crenshaw's recommendations.  °

## 2020-09-07 NOTE — Telephone Encounter (Signed)
Given history of ITP and hemoptysis we will hold on resuming apixaban at this point. [name]

## 2020-09-10 ENCOUNTER — Other Ambulatory Visit: Payer: Medicare PPO

## 2020-09-10 ENCOUNTER — Ambulatory Visit (HOSPITAL_BASED_OUTPATIENT_CLINIC_OR_DEPARTMENT_OTHER): Admission: RE | Admit: 2020-09-10 | Payer: Medicare PPO | Source: Ambulatory Visit

## 2020-09-13 ENCOUNTER — Ambulatory Visit: Payer: Medicare PPO | Admitting: Adult Health

## 2020-09-16 DIAGNOSIS — J449 Chronic obstructive pulmonary disease, unspecified: Secondary | ICD-10-CM | POA: Diagnosis not present

## 2020-09-24 DIAGNOSIS — G4733 Obstructive sleep apnea (adult) (pediatric): Secondary | ICD-10-CM | POA: Diagnosis not present

## 2020-09-24 DIAGNOSIS — J8410 Pulmonary fibrosis, unspecified: Secondary | ICD-10-CM | POA: Diagnosis not present

## 2020-09-24 DIAGNOSIS — J449 Chronic obstructive pulmonary disease, unspecified: Secondary | ICD-10-CM | POA: Diagnosis not present

## 2020-09-24 DIAGNOSIS — E860 Dehydration: Secondary | ICD-10-CM | POA: Diagnosis not present

## 2020-09-25 ENCOUNTER — Encounter: Payer: Self-pay | Admitting: Vascular Surgery

## 2020-09-25 ENCOUNTER — Ambulatory Visit (INDEPENDENT_AMBULATORY_CARE_PROVIDER_SITE_OTHER): Payer: Medicare PPO | Admitting: Vascular Surgery

## 2020-09-25 ENCOUNTER — Telehealth: Payer: Self-pay | Admitting: Adult Health

## 2020-09-25 ENCOUNTER — Other Ambulatory Visit: Payer: Self-pay

## 2020-09-25 VITALS — BP 170/84 | HR 76 | Resp 18 | Ht 74.0 in | Wt 224.0 lb

## 2020-09-25 DIAGNOSIS — I724 Aneurysm of artery of lower extremity: Secondary | ICD-10-CM

## 2020-09-25 MED ORDER — ZOLPIDEM TARTRATE 5 MG PO TABS: 5.0000 mg | ORAL_TABLET | Freq: Every evening | ORAL | 0 refills | Status: DC | PRN

## 2020-09-25 NOTE — Progress Notes (Signed)
Patient name: [name] MRN: [id number] DOB: 03-10-1941 Sex: male  REASON FOR VISIT:.  Post-op Check  HPI: [name] is a 80 y.o. male with multiple medical comorbidities who presents for postop check.  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  Ultimately he has had a difficult time sleeping postop and has had a lot of insomnia.  Overall he is not having much pain in the incisions, but is very uncomfortable with lower extremity swelling.  Of note he does have a history of an aortobiiliac bypass for AAA in 2004.  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  Past Medical History:  Diagnosis Date  . 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 Anxiety   . Aortic aneurysm (HCC) 09/02/2017  . BENIGN PROSTATIC HYPERTROPHY 11/23/2009  . Cardiomyopathy (HCC) 08/28/2016  . Chronic systolic CHF (congestive heart failure) (HCC) 09/02/2017  . COPD (chronic obstructive pulmonary disease) (HCC)   . CORONARY ARTERY DISEASE 11/23/2009  . 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 DEGENERATIVE JOINT DISEASE 11/23/2009  . DEPRESSION 11/23/2009  . FATIGUE 11/23/2009  . GAIT DISTURBANCE 12/10/2009  . HEMOPTYSIS UNSPECIFIED 05/07/2010  . High cholesterol   . HYPERTENSION 07/30/2009  . HYPOTHYROIDISM 07/30/2009  . Ischemic cardiomyopathy 09/02/2017  . 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 On home oxygen therapy    "2-3L; 24/7" (09/10/2016)  . OSA on CPAP   . Pneumonia   . PTSD (post-traumatic stress disorder) 03/10/2012  . 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 RA (rheumatoid arthritis) (HCC) 06/11/2011   "qwhere" (09/10/2016)  . RESPIRATORY FAILURE, CHRONIC 07/31/2009  . Scleritis of both eyes 03/17/2014  . Thrombocytopenia (HCC)   . 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 Type II diabetes mellitus (HCC)     Past Surgical History:  Procedure Laterality Date  . ABDOMINAL AORTIC  ANEURYSM REPAIR  07/2002   [name] 12/10/2010  . ABDOMINAL AORTOGRAM W/LOWER EXTREMITY N/A 08/16/2020   Procedure: ABDOMINAL AORTOGRAM W/LOWER EXTREMITY;  Surgeon: Cephus Shelling, MD;  Location: [facility] INVASIVE CV LAB;  Service: Cardiovascular;  Laterality: N/A;  . ABDOMINAL EXPLORATION SURGERY  02/2004   w/LOA/notes 12/10/2010; small bowel obstruction repair with adhesiolysis   . BACK SURGERY    . CARDIAC CATHETERIZATION     2 heart caths in the past.  One in 2000s showed one ulcerated plaque  Rx medically; [facility] [name] 09/05/2016  . CATARACT EXTRACTION W/ INTRAOCULAR LENS  IMPLANT, BILATERAL Bilateral 2000s  . COLECTOMY     hx of remote ileum resection due to bleeding  . COLONOSCOPY WITH PROPOFOL N/A 01/22/2018   Procedure: COLONOSCOPY WITH PROPOFOL;  Surgeon: Iva Boop, MD;  Location: [facility] ENDOSCOPY;  Service: Endoscopy;  Laterality: N/A;  . CORONARY ANGIOPLASTY WITH STENT PLACEMENT  09/10/2016  . CORONARY STENT INTERVENTION N/A 09/10/2016   Procedure: Coronary Stent Intervention;  Surgeon: Kathleene Hazel, MD;  Location: [facility] INVASIVE CV LAB;  Service: Cardiovascular;  Laterality: N/A;  Distal RCA 4.0x16 Synergy  . ESOPHAGOGASTRODUODENOSCOPY (EGD) WITH PROPOFOL N/A 01/22/2018   Procedure: ESOPHAGOGASTRODUODENOSCOPY (EGD) WITH PROPOFOL;  Surgeon: Iva Boop, MD;  Location: [facility] ENDOSCOPY;  Service: Endoscopy;  Laterality: N/A;  . EYE SURGERY    . FALSE ANEURYSM REPAIR Left 08/22/2020   Procedure: EXCLUSION OF LEFT POPLITEAL ARTERY ANEURYSM;  Surgeon: Cephus Shelling, MD;  Location: [facility] OR;  Service: Vascular;  Laterality:  Left;  . FEMORAL EMBOLOECTOMY Left 07/2000   with left leg ischemia; Dr. Hart Rochester, vascular  . FEMORAL-POPLITEAL BYPASS GRAFT Bilateral 08/22/2020   Procedure: LEFT FEMORAL-POSTERIOR TIBIAL ARTERY BYPASS GRAFT USING RIGHT GREATER NONREVERSED SAPHENOUS VEIN GRAFT;  Surgeon: Cephus Shelling, MD;  Location: [facility] OR;  Service: Vascular;  Laterality: Bilateral;   . GANGLION CYST EXCISION Right    "wrist"; Dr. Teressa Senter  . HOT HEMOSTASIS N/A 01/22/2018   Procedure: HOT HEMOSTASIS (ARGON PLASMA COAGULATION/BICAP);  Surgeon: Iva Boop, MD;  Location: [facility] ENDOSCOPY;  Service: Endoscopy;  Laterality: N/A;  . LOOP RECORDER INSERTION N/A 04/25/2019   Procedure: LOOP RECORDER INSERTION;  Surgeon: Duke Salvia, MD;  Location: [facility] INVASIVE CV LAB;  Service: Cardiovascular;  Laterality: N/A;  . LOOP RECORDER REMOVAL N/A 07/01/2019   Procedure: LOOP RECORDER REMOVAL;  Surgeon: Duke Salvia, MD;  Location: [facility] INVASIVE CV LAB;  Service: Cardiovascular;  Laterality: N/A;  . LUMBAR LAMINECTOMY  1972   Dr. Fannie Knee  . PACEMAKER IMPLANT N/A 07/01/2019   Procedure: PACEMAKER IMPLANT;  Surgeon: Duke Salvia, MD;  Location: [facility] INVASIVE CV LAB;  Service: Cardiovascular;  Laterality: N/A;  . RIGHT/LEFT HEART CATH AND CORONARY ANGIOGRAPHY N/A 09/10/2016   Procedure: Right/Left Heart Cath and Coronary Angiography;  Surgeon: Kathleene Hazel, MD;  Location: [facility] INVASIVE CV LAB;  Service: Cardiovascular;  Laterality: N/A;  . TONSILLECTOMY      Family History  Problem Relation Age of Onset  . Other Mother        gun shot    SOCIAL HISTORY: Social History   Tobacco Use  . Smoking status: Former Smoker    Packs/day: 2.50    Years: 40.00    Pack years: 100.00    Types: Cigarettes, Pipe, Cigars    Quit date: 07/28/1998    Years since quitting: 22.1  . Smokeless tobacco: Never Used  Substance Use Topics  . Alcohol use: No    Alcohol/week: 0.0 standard drinks    No Known Allergies  Current Outpatient Medications  Medication Sig Dispense Refill  . albuterol (PROVENTIL) (2.5 MG/3ML) 0.083% nebulizer solution 1 vial in nebulizer every 6 hours as needed Dx 496 (Patient taking differently: Take 2.5 mg by nebulization every 6 (six) hours as needed for shortness of breath or wheezing. Dx 496) 120 mL 6  . ALPRAZolam (XANAX) 0.5 MG tablet Take 0.5 mg by mouth at  bedtime.     [name] atorvastatin (LIPITOR) 40 MG tablet Take 1 tablet (40 mg total) by mouth daily. 90 tablet 3  . Carboxymethylcellul-Glycerin (LUBRICATING EYE DROPS OP) Place 1 drop into both eyes 4 (four) times daily as needed (dry eyes).     . Cholecalciferol (VITAMIN D3) 2000 units capsule Take 2,000 Units by mouth daily.     . empagliflozin (JARDIANCE) 10 MG TABS tablet Take 5 mg by mouth daily. Filled at [place].    [name] gabapentin (NEURONTIN) 100 MG capsule Take 1 capsule (100 mg total) by mouth 2 (two) times daily. 90 capsule 0  . insulin glargine (LANTUS) 100 UNIT/ML injection Inject 60 Units into the skin at bedtime.     [name] levothyroxine (SYNTHROID) 150 MCG tablet Take 150 mcg by mouth daily before breakfast.    . losartan (COZAAR) 50 MG tablet Take 1 tablet (50 mg total) by mouth daily. 90 tablet 3  . Menthol, Topical Analgesic, (BIOFREEZE EX) Apply 1 application topically 3 (three) times daily as needed (pain. (calf muscle)).    . metFORMIN (GLUCOPHAGE)  500 MG tablet Take 500-1,000 mg by mouth 2 (two) times daily with a meal. Take 1 tablet (500 mg) by mouth in the morning & take 2 tablets (1000 mg) by mouth in the evening.    . Multiple Vitamins-Minerals (MEGA MULTIVITAMIN FOR MEN PO) Take 1 tablet by mouth daily.    . Olodaterol HCl (STRIVERDI RESPIMAT) 2.5 MCG/ACT AERS Inhale 1 puff into the lungs daily. (Patient taking differently: Inhale 1 puff into the lungs every evening.)    . OXYGEN Inhale 2 L into the lungs continuous.    . predniSONE (DELTASONE) 5 MG tablet Take 1 tablet (5 mg total) by mouth daily with breakfast. 90 tablet 3  . Semaglutide (OZEMPIC, 1 MG/DOSE, [place]) Inject 1 mg into the skin every Friday.    . tamsulosin (FLOMAX) 0.4 MG CAPS capsule Take 1 capsule (0.4 mg total) by mouth at bedtime. 30 capsule 0  . tiotropium (SPIRIVA HANDIHALER) 18 MCG inhalation capsule Place 1 capsule (18 mcg total) into inhaler and inhale daily. 30 capsule 4  . furosemide (LASIX) 40 MG tablet Take 1  tablet (40 mg total) by mouth daily. (Patient taking differently: Take 40 mg by mouth daily as needed (fluid retention.).) 90 tablet 3  . oxyCODONE-acetaminophen (PERCOCET/ROXICET) 5-325 MG tablet Take 1 tablet by mouth every 6 (six) hours as needed for severe pain. (Patient not taking: Reported on 09/25/2020) 20 tablet 0   No current facility-administered medications for this visit.    REVIEW OF SYSTEMS:  [X]  denotes positive finding, [ ]  denotes negative finding Cardiac  Comments:  Chest pain or chest pressure:    Shortness of breath upon exertion:    Short of breath when lying flat:    Irregular heart rhythm:        Vascular    Pain in calf, thigh, or hip brought on by ambulation:    Pain in feet at night that wakes you up from your sleep:     Blood clot in your veins:    Leg swelling:         Pulmonary    Oxygen at home:    Productive cough:     Wheezing:         Neurologic    Sudden weakness in arms or legs:     Sudden numbness in arms or legs:     Sudden onset of difficulty speaking or slurred speech:    Temporary loss of vision in one eye:     Problems with dizziness:         Gastrointestinal    Blood in stool:     Vomited blood:         Genitourinary    Burning when urinating:     Blood in urine:        Psychiatric    Major depression:         Hematologic    Bleeding problems:    Problems with blood clotting too easily:        Skin    Rashes or ulcers:        Constitutional    Fever or chills:      PHYSICAL EXAM: Vitals:   09/25/20 1545  BP: (!) 170/84  Pulse: 76  Resp: 18  Weight: 224 lb (101.6 kg)  Height: 6\' 2"  (1.88 m)    GENERAL: The patient is a well-nourished male, in no acute distress. The vital signs are documented above. CARDIAC: There is a regular rate and rhythm.  VASCULAR:  Left PT palpable Right leg vein harvest incisions healing appropriately Left groin and leg incisions healing appropriately Significant lower extremity  edema .  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  His incisions are all healing without issue.  He has a palpable PT pulse at the left ankle.  Very happy with his progress.  His main complaint today is basically insomnia as well as lower extremity swelling and edema that has been uncomfortable.  I did get him sized for knee-high compression 15 to 20 mmHg to hopefully alleviate some of the swelling.  I did prescribe some very low-dose Ambien 5 mg as needed.  He has tried Tylenol PM as well wiith no relief.  I will see him in 1 month to see how he is progressing clinically and make a decision about intervention on the right popliteal artery aneurysm.  I'd be hopeful to treat this with a covered stent but will have to evaluate either brachial versus antegrade access in the groin given previous aortobiiliac graft.   Cephus Shelling, MD [facility] Office: [phone]

## 2020-09-26 NOTE — Telephone Encounter (Addendum)
Pt was scheduled on 2/14 at [facility] & he was a "no show".  Called pt to give him their # to reschedule.  He asked that I call for him and he gave me available dates to schedule.  I had to leave a vm for [facility] & asked them to call pt to reschedule.  I called pt back & made him aware I left vm for [facility] to call him.  He states ok.  Nothing further needed.

## 2020-09-28 ENCOUNTER — Ambulatory Visit (INDEPENDENT_AMBULATORY_CARE_PROVIDER_SITE_OTHER): Payer: Medicare PPO

## 2020-09-28 DIAGNOSIS — I255 Ischemic cardiomyopathy: Secondary | ICD-10-CM

## 2020-10-01 LAB — CUP PACEART REMOTE DEVICE CHECK
Battery Remaining Longevity: 161 mo
Battery Voltage: 3.08 V
Brady Statistic AP VP Percent: 20.33 %
Brady Statistic AP VS Percent: 3.28 %
Brady Statistic AS VP Percent: 1.71 %
Brady Statistic AS VS Percent: 74.68 %
Brady Statistic RA Percent Paced: 23.64 %
Brady Statistic RV Percent Paced: 22.05 %
Date Time Interrogation Session: 20220304032150
Implantable Lead Implant Date: 20201204
Implantable Lead Implant Date: 20201204
Implantable Lead Location: 753859
Implantable Lead Location: 753860
Implantable Lead Model: 5076
Implantable Lead Model: 5076
Implantable Pulse Generator Implant Date: 20201204
Lead Channel Impedance Value: 380 Ohm
Lead Channel Impedance Value: 380 Ohm
Lead Channel Impedance Value: 437 Ohm
Lead Channel Impedance Value: 437 Ohm
Lead Channel Pacing Threshold Amplitude: 0.5 V
Lead Channel Pacing Threshold Amplitude: 0.5 V
Lead Channel Pacing Threshold Pulse Width: 0.4 ms
Lead Channel Pacing Threshold Pulse Width: 0.4 ms
Lead Channel Sensing Intrinsic Amplitude: 11.125 mV
Lead Channel Sensing Intrinsic Amplitude: 11.125 mV
Lead Channel Sensing Intrinsic Amplitude: 2.875 mV
Lead Channel Sensing Intrinsic Amplitude: 2.875 mV
Lead Channel Setting Pacing Amplitude: 1.5 V
Lead Channel Setting Pacing Amplitude: 2.5 V
Lead Channel Setting Pacing Pulse Width: 0.4 ms
Lead Channel Setting Sensing Sensitivity: 2 mV

## 2020-10-03 ENCOUNTER — Other Ambulatory Visit: Payer: Self-pay

## 2020-10-03 ENCOUNTER — Ambulatory Visit (HOSPITAL_BASED_OUTPATIENT_CLINIC_OR_DEPARTMENT_OTHER)
Admission: RE | Admit: 2020-10-03 | Discharge: 2020-10-03 | Disposition: A | Discharge status: Home / Self Care | Payer: Medicare PPO | Source: Ambulatory Visit | Attending: Adult Health | Admitting: Adult Health

## 2020-10-03 DIAGNOSIS — R918 Other nonspecific abnormal finding of lung field: Secondary | ICD-10-CM | POA: Diagnosis not present

## 2020-10-03 DIAGNOSIS — J432 Centrilobular emphysema: Secondary | ICD-10-CM | POA: Diagnosis not present

## 2020-10-03 DIAGNOSIS — I2510 Atherosclerotic heart disease of native coronary artery without angina pectoris: Secondary | ICD-10-CM | POA: Diagnosis not present

## 2020-10-03 DIAGNOSIS — J438 Other emphysema: Secondary | ICD-10-CM | POA: Diagnosis not present

## 2020-10-03 DIAGNOSIS — I712 Thoracic aortic aneurysm, without rupture: Secondary | ICD-10-CM | POA: Diagnosis not present

## 2020-10-03 NOTE — Progress Notes (Signed)
Patient Care Team: Myrlene Broker, MD as PCP - General (Internal Medicine) Ernesto Rutherford, MD as Consulting Physician (Ophthalmology) Kalman Shan, MD as Consulting Physician (Pulmonary Disease) System, Provider Not In as Consulting Physician Casimer Lanius, MD as Consulting Physician (Rheumatology) Oretha Milch, MD as Consulting Physician (Pulmonary Disease) Lewayne Bunting, MD as Consulting Physician (Cardiology)  DIAGNOSIS:    ICD-10-CM   1. 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 with above-mentioned history of ITPcurrently on treatment with prednisone5 mg daily.He presents to the clinic todayfor follow-up.   ALLERGIES:  has No Known Allergies.  MEDICATIONS:  Current Outpatient Medications  Medication Sig Dispense Refill  . albuterol (PROVENTIL) (2.5 MG/3ML) 0.083% nebulizer solution 1 vial in nebulizer every 6 hours as needed Dx 496 (Patient taking differently: Take 2.5 mg by nebulization every 6 (six) hours as needed for shortness of breath or wheezing. Dx 496) 120 mL 6  . ALPRAZolam (XANAX) 0.5 MG tablet Take 0.5 mg by mouth at bedtime.     [name] atorvastatin (LIPITOR) 40 MG tablet Take 1 tablet (40 mg total) by mouth daily. 90 tablet 3  . Carboxymethylcellul-Glycerin (LUBRICATING EYE DROPS OP) Place 1 drop into both eyes 4 (four) times daily as needed (dry eyes).     . Cholecalciferol (VITAMIN D3) 2000 units capsule Take 2,000 Units by mouth daily.     . empagliflozin (JARDIANCE) 10 MG TABS tablet Take 5 mg by mouth daily. Filled at [place].    . furosemide (LASIX) 40 MG tablet Take 1 tablet (40 mg total) by mouth daily. (Patient taking differently: Take 40 mg by mouth daily as needed (fluid retention.).) 90 tablet 3  . gabapentin (NEURONTIN) 100 MG capsule Take 1 capsule (100 mg total) by mouth 2 (two) times daily. 90 capsule 0  . insulin glargine (LANTUS) 100 UNIT/ML injection Inject  60 Units into the skin at bedtime.     [name] levothyroxine (SYNTHROID) 150 MCG tablet Take 150 mcg by mouth daily before breakfast.    . losartan (COZAAR) 50 MG tablet Take 1 tablet (50 mg total) by mouth daily. 90 tablet 3  . Menthol, Topical Analgesic, (BIOFREEZE EX) Apply 1 application topically 3 (three) times daily as needed (pain. (calf muscle)).    . metFORMIN (GLUCOPHAGE) 500 MG tablet Take 500-1,000 mg by mouth 2 (two) times daily with a meal. Take 1 tablet (500 mg) by mouth in the morning & take 2 tablets (1000 mg) by mouth in the evening.    . Multiple Vitamins-Minerals (MEGA MULTIVITAMIN FOR MEN PO) Take 1 tablet by mouth daily.    . Olodaterol HCl (STRIVERDI RESPIMAT) 2.5 MCG/ACT AERS Inhale 1 puff into the lungs daily. (Patient taking differently: Inhale 1 puff into the lungs every evening.)    . oxyCODONE-acetaminophen (PERCOCET/ROXICET) 5-325 MG tablet Take 1 tablet by mouth every 6 (six) hours as needed for severe pain. (Patient not taking: Reported on 09/25/2020) 20 tablet 0  . OXYGEN Inhale 2 L into the lungs continuous.    . predniSONE (DELTASONE) 5 MG tablet Take 1 tablet (5 mg total) by mouth daily with breakfast. 90 tablet 3  . Semaglutide (OZEMPIC, 1 MG/DOSE, [place]) Inject 1 mg into the skin every Friday.    . tamsulosin (FLOMAX) 0.4 MG CAPS capsule Take 1 capsule (0.4 mg total) by mouth at bedtime. 30 capsule 0  . tiotropium (SPIRIVA HANDIHALER) 18 MCG inhalation capsule  Place 1 capsule (18 mcg total) into inhaler and inhale daily. 30 capsule 4  . zolpidem (AMBIEN) 5 MG tablet Take 1 tablet (5 mg total) by mouth at bedtime as needed for sleep. 30 tablet 0   No current facility-administered medications for this visit.    PHYSICAL EXAMINATION: ECOG PERFORMANCE STATUS: 1 - Symptomatic but completely ambulatory  Vitals:   10/04/20 1101  BP: (!) 153/82  Pulse: 66  Resp: 19  Temp: 97.9 F (36.6 C)  SpO2: 99%   Filed Weights   10/04/20 1101  Weight: 223 lb 9.6 oz (101.4 kg)      LABORATORY DATA:  I have reviewed the data as listed CMP Latest Ref Rng & Units 08/26/2020 08/25/2020 08/24/2020  Glucose 70 - 99 mg/dL 150(V) 91 96  BUN 8 - 23 mg/dL 23 69(V) 94(I)  Creatinine 0.61 - 1.24 mg/dL 0.16 5.53(Z) 4.82(L)  Sodium 135 - 145 mmol/L 143 141 141  Potassium 3.5 - 5.1 mmol/L 4.0 3.8 4.0  Chloride 98 - 111 mmol/L 107 107 106  CO2 22 - 32 mmol/L 28 26 26   Calcium 8.9 - 10.3 mg/dL 8.9 ) 0.7(E)  Total Protein 6.5 - 8.1 g/dL - - -  Total Bilirubin 0.3 - 1.2 mg/dL - - -  Alkaline Phos 38 - 126 U/L - - -  AST 15 - 41 U/L - - -  ALT 0 - 44 U/L - - -    Lab Results  Component Value Date   WBC 6.8 10/04/2020   HGB 10.7 (L) 10/04/2020   HCT 35.7 (L) 10/04/2020   MCV 86.9 10/04/2020   PLT 67 (L) 10/04/2020   NEUTROABS 5.0 10/04/2020    ASSESSMENT & PLAN:  Acute ITP (HCC) Relapsed acute ITP Prior treatment: Prednisone started 07/05/2019 completed 10/23/2019 Lab review: 02/08/2020:Platelet count 37 with blood with expectorationstarted on prednisone 02/15/2020:Platelet count 98 03/19/2020: Platelet count:68 04/16/2020: Platelet count64(prednisone dose lowered to 5 mg) 06/25/2020: Platelets 38 07/09/2020: Platelets 73 (continue with 5 mg prednisone) 08/26/2020: Platelets 73  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  He has a stent plan for the right leg coming up.  Current dose of prednisone:5 mg I recommended discontinuation of prednisone therapy at this time. Recheck labs and follow-up in 1 month.   No orders of the defined types were placed in this encounter.  The patient has a good understanding of the overall plan. he agrees with it. he will call with any problems that may develop before the next visit here.  Total time spent: 20 mins including face to face time and time spent for planning, charting and coordination of care  08/29/2020, MD,  [facility] 10/04/2020  I, Molly Dorshimer, am acting as scribe for Dr. 12/04/2020.  I have reviewed the above documentation for accuracy and completeness, and I agree with the above.

## 2020-10-04 ENCOUNTER — Other Ambulatory Visit: Payer: Self-pay

## 2020-10-04 ENCOUNTER — Telehealth: Payer: Self-pay | Admitting: Hematology and Oncology

## 2020-10-04 ENCOUNTER — Inpatient Hospital Stay: Payer: Medicare PPO

## 2020-10-04 ENCOUNTER — Inpatient Hospital Stay: Payer: Medicare PPO | Attending: Hematology and Oncology | Admitting: Hematology and Oncology

## 2020-10-04 DIAGNOSIS — Z79899 Other long term (current) drug therapy: Secondary | ICD-10-CM | POA: Insufficient documentation

## 2020-10-04 DIAGNOSIS — D693 Immune thrombocytopenic purpura: Secondary | ICD-10-CM | POA: Insufficient documentation

## 2020-10-04 LAB — CBC WITH DIFFERENTIAL (CANCER CENTER ONLY)
Abs Immature Granulocytes: 0.03 10*3/uL (ref 0.00–0.07)
Basophils Absolute: 0 10*3/uL (ref 0.0–0.1)
Basophils Relative: 0 %
Eosinophils Absolute: 0.2 10*3/uL (ref 0.0–0.5)
Eosinophils Relative: 3 %
HCT: 35.7 % — ABNORMAL LOW (ref 39.0–52.0)
Hemoglobin: 10.7 g/dL — ABNORMAL LOW (ref 13.0–17.0)
Immature Granulocytes: 0 %
Lymphocytes Relative: 16 %
Lymphs Abs: 1.1 10*3/uL (ref 0.7–4.0)
MCH: 26 pg (ref 26.0–34.0)
MCHC: 30 g/dL (ref 30.0–36.0)
MCV: 86.9 fL (ref 80.0–100.0)
Monocytes Absolute: 0.5 10*3/uL (ref 0.1–1.0)
Monocytes Relative: 7 %
Neutro Abs: 5 10*3/uL (ref 1.7–7.7)
Neutrophils Relative %: 74 %
Platelet Count: 67 10*3/uL — ABNORMAL LOW (ref 150–400)
RBC: 4.11 MIL/uL — ABNORMAL LOW (ref 4.22–5.81)
RDW: 15.8 % — ABNORMAL HIGH (ref 11.5–15.5)
WBC Count: 6.8 10*3/uL (ref 4.0–10.5)
nRBC: 0 % (ref 0.0–0.2)

## 2020-10-04 NOTE — Telephone Encounter (Signed)
Scheduled appts per 3/10 los. Gave pt a print out of AVS.  

## 2020-10-04 NOTE — Assessment & Plan Note (Signed)
Relapsed acute ITP Prior treatment: Prednisone started 07/05/2019 completed 10/23/2019 Lab review: 02/08/2020:Platelet count 37 with blood with expectorationstarted on prednisone 02/15/2020:Platelet count 98 03/19/2020: Platelet count:68 04/16/2020: Platelet count64(prednisone dose lowered to 5 mg) 06/25/2020: Platelets 38 07/09/2020: Platelets 73 (continue with 5 mg prednisone) 08/26/2020: Platelets 73  Hospitalization 08/22/2020-08/27/2020: Popliteal aneurysm: Left femoral-posterior tibial artery bypass graft using saphenous vein graft  Current dose of prednisone:5 mg

## 2020-10-08 ENCOUNTER — Ambulatory Visit: Payer: Medicare PPO | Admitting: Primary Care

## 2020-10-08 ENCOUNTER — Other Ambulatory Visit: Payer: Self-pay

## 2020-10-09 ENCOUNTER — Telehealth: Payer: Self-pay | Admitting: *Deleted

## 2020-10-09 ENCOUNTER — Ambulatory Visit (INDEPENDENT_AMBULATORY_CARE_PROVIDER_SITE_OTHER): Payer: Medicare PPO | Admitting: Adult Health

## 2020-10-09 DIAGNOSIS — Z9989 Dependence on other enabling machines and devices: Secondary | ICD-10-CM

## 2020-10-09 DIAGNOSIS — J449 Chronic obstructive pulmonary disease, unspecified: Secondary | ICD-10-CM

## 2020-10-09 DIAGNOSIS — G4733 Obstructive sleep apnea (adult) (pediatric): Secondary | ICD-10-CM

## 2020-10-09 DIAGNOSIS — R911 Solitary pulmonary nodule: Secondary | ICD-10-CM | POA: Diagnosis not present

## 2020-10-09 DIAGNOSIS — J9611 Chronic respiratory failure with hypoxia: Secondary | ICD-10-CM | POA: Diagnosis not present

## 2020-10-09 MED ORDER — DOXYCYCLINE HYCLATE 100 MG PO TABS: 100.0000 mg | ORAL_TABLET | Freq: Two times a day (BID) | ORAL | 0 refills | Status: DC

## 2020-10-09 NOTE — Patient Instructions (Addendum)
Doxycycline 100mg  Twice daily  For 7 days.  Mucinex DM Twice daily  As needed  Cough/congestion .  Continue on Spiriva and Striverdi daily , rinse after use .  Continue on Oxygen 2l/m  Continue on CPAP At bedtime  With oxygen . Wear with Naps.  Activity as tolerated.  CT chest 1 year .  Follow up in 3 months with Dr. or [place] NP .  Please contact office for sooner follow up if symptoms do not improve or worsen or seek emergency care

## 2020-10-09 NOTE — Telephone Encounter (Signed)
Called and spoke with patient, scheduled 3 month with Rubye Oaks NP for Thursday 01/10/21 at 9:30 am, advised to arrive by 9:15 am for check in.  Patient verbalized understanding.  No schedule available for Dr. Vassie Loll at this time.  Nothing further needed.

## 2020-10-09 NOTE — Progress Notes (Signed)
Virtual Visit via Telephone Note  I connected with [name] on 10/09/20 at 12:00 PM EDT by telephone and verified that I am speaking with the correct person using two identifiers.  Location: Patient: Home  Provider: Office    I discussed the limitations, risks, security and privacy concerns of performing an evaluation and management service by telephone and the availability of in person appointments. I also discussed with the patient that there may be a patient responsible charge related to this service. The patient expressed understanding and agreed to proceed.   History of Present Illness: 80 year old male former smoker followed for very severe COPD, chronic respiratory failure on oxygen at 2 L.  Patient has obstructive sleep apnea on nocturnal CPAP.  Is followed on serial CT chest for lung nodules.  Medical history significant for rheumatoid arthritis on no maintenance medications.  Diabetes, congestive heart failure diagnosed in 2018 with echo showing an EF of 35 to 40%, coronary disease status post RCA stent in 2018, chronic kidney disease.  A. fib previously on amiodarone which stopped in August 2019.  Previously on Eliquis stopped in July 2021 status post pacemaker December 2020 after syncopal episodes.  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 Patient gets his medications through the [place] system as he retired Hotel manager.  Today's telemedicine visit is a follow-up for COPD and a CT scan.  Patient says that he has had some increased cough and congestion over the last 1 to 2 weeks with some thicker yellow mucus.  Feels that his breathing is not quite as good as it has been.  Is worried that he is turning into a bronchitis.  He denies is any wheezing or hemoptysis.  No fever.  No loss of taste or smell.  He remains on Spiriva and Striverdi .  He denies any increased albuterol use.  Patient has known lung nodules that are followed serially on CT scan.  Most recent CT  chest on October 04, 2020 showed severe emphysema, extensive pleural calcification.  Previous nodule in the right lower lobe has significantly decreased in size measuring 0.8 x 0.8 cm previously at 2.3 cm.  A stable left upper lobe cavitary nodule measuring 4 mm.  And a stable irregular nodule in the left lower lobe measuring 1.5 cm, and a left lower lobe nodule measuring 1.5 cm. We reviewed his CT scan results.  And discussed a follow-up CT chest planned in 1 year.  Patient remains on oxygen 2 L.  Denies any increased oxygen demands.  He remains on CPAP at bedtime.  Says he never misses any CPAP nights.  Feels rested.  Patient says he did have recent left common femoral to popliteal bypass on August 22, 2020 for a thrombosed left popliteal artery aneurysm.  Patient says he is doing okay but has had slow healing and continues to have ongoing lower extremity swelling.  Patient is following with vascular surgery and cardiology.    Past Medical History:  Diagnosis Date  . 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 Anxiety   . Aortic aneurysm (HCC) 09/02/2017  . BENIGN PROSTATIC HYPERTROPHY 11/23/2009  . Cardiomyopathy (HCC) 08/28/2016  . Chronic systolic CHF (congestive heart failure) (HCC) 09/02/2017  . COPD (chronic obstructive pulmonary disease) (HCC)   . CORONARY ARTERY DISEASE 11/23/2009  . 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 DEGENERATIVE JOINT DISEASE 11/23/2009  . DEPRESSION 11/23/2009  . FATIGUE 11/23/2009  . GAIT DISTURBANCE 12/10/2009  . HEMOPTYSIS UNSPECIFIED 05/07/2010  .  High cholesterol   . HYPERTENSION 07/30/2009  . HYPOTHYROIDISM 07/30/2009  . Ischemic cardiomyopathy 09/02/2017  . 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 On home oxygen therapy    "2-3L; 24/7" (09/10/2016)  . OSA on CPAP   . Pneumonia   . PTSD (post-traumatic stress disorder) 03/10/2012  . 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 RA (rheumatoid arthritis) (HCC) 06/11/2011   "qwhere" (09/10/2016)  . RESPIRATORY FAILURE,  CHRONIC 07/31/2009  . Scleritis of both eyes 03/17/2014  . Thrombocytopenia (HCC)   . 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 Type II diabetes mellitus (HCC)     Current Outpatient Medications on File Prior to Visit  Medication Sig Dispense Refill  . albuterol (PROVENTIL) (2.5 MG/3ML) 0.083% nebulizer solution 1 vial in nebulizer every 6 hours as needed Dx 496 (Patient taking differently: Take 2.5 mg by nebulization every 6 (six) hours as needed for shortness of breath or wheezing. Dx 496) 120 mL 6  . ALPRAZolam (XANAX) 0.5 MG tablet Take 0.5 mg by mouth at bedtime.     [name] atorvastatin (LIPITOR) 40 MG tablet Take 1 tablet (40 mg total) by mouth daily. 90 tablet 3  . Carboxymethylcellul-Glycerin (LUBRICATING EYE DROPS OP) Place 1 drop into both eyes 4 (four) times daily as needed (dry eyes).     . Cholecalciferol (VITAMIN D3) 2000 units capsule Take 2,000 Units by mouth daily.     . empagliflozin (JARDIANCE) 10 MG TABS tablet Take 5 mg by mouth daily. Filled at [place].    [name] gabapentin (NEURONTIN) 100 MG capsule Take 1 capsule (100 mg total) by mouth 2 (two) times daily. 90 capsule 0  . insulin glargine (LANTUS) 100 UNIT/ML injection Inject 60 Units into the skin at bedtime.     [name] levothyroxine (SYNTHROID) 150 MCG tablet Take 150 mcg by mouth daily before breakfast.    . losartan (COZAAR) 50 MG tablet Take 1 tablet (50 mg total) by mouth daily. 90 tablet 3  . Menthol, Topical Analgesic, (BIOFREEZE EX) Apply 1 application topically 3 (three) times daily as needed (pain. (calf muscle)).    . metFORMIN (GLUCOPHAGE) 500 MG tablet Take 500-1,000 mg by mouth 2 (two) times daily with a meal. Take 1 tablet (500 mg) by mouth in the morning & take 2 tablets (1000 mg) by mouth in the evening.    . Multiple Vitamins-Minerals (MEGA MULTIVITAMIN FOR MEN PO) Take 1 tablet by mouth daily.    . Olodaterol HCl (STRIVERDI RESPIMAT) 2.5 MCG/ACT AERS Inhale 1 puff into the lungs daily. (Patient taking differently: Inhale 1 puff into the lungs  every evening.)    . OXYGEN Inhale 2 L into the lungs continuous.    . Semaglutide (OZEMPIC, 1 MG/DOSE, [place]) Inject 1 mg into the skin every Friday.    . tamsulosin (FLOMAX) 0.4 MG CAPS capsule Take 1 capsule (0.4 mg total) by mouth at bedtime. 30 capsule 0  . tiotropium (SPIRIVA HANDIHALER) 18 MCG inhalation capsule Place 1 capsule (18 mcg total) into inhaler and inhale daily. 30 capsule 4  . zolpidem (AMBIEN) 5 MG tablet Take 1 tablet (5 mg total) by mouth at bedtime as needed for sleep. 30 tablet 0  . furosemide (LASIX) 40 MG tablet Take 1 tablet (40 mg total) by mouth daily. (Patient taking differently: Take 40 mg by mouth daily as needed (fluid retention.).) 90 tablet 3   No current facility-administered medications on file prior to visit.       Observations/Objective: 10/09/2020 speaks in full sentences with no audible distress.  Says O2 saturations have been 94 to 95% on oxygen 2 L.  CT was done on September 13, 2019 showed a slightly decreased cavitary left apical nodule measuring 1.0 cm. Irregular subsolid left lower lobe pulmonary nodules are stable largest at 1.8 cm. There were no new or enlarging pulmonary nodules. We discussed a follow-up CT chest in 1 year.  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   Assessment and Plan: COPD exacerbation.-We will treat with empiric antibiotics with doxycycline.  Continue on mucociliary clearance regimen.  Oxygen pendant respiratory failure continue on oxygen.  Appears stable with no increased oxygen demands.  Obstructive sleep apnea.  Continue on CPAP at bedtime with oxygen.  Abnormal CT chest with scattered pulmonary nodules.  Nodules appear stable.  With decreased right upper lobe nodule size.  We will do serial CT in 1 year.  Plan  Patient Instructions  Doxycycline 100mg  Twice daily  For 7 days.  Mucinex DM Twice  daily  As needed  Cough/congestion .  Continue on Spiriva and Striverdi daily , rinse after use .  Continue on Oxygen 2l/m  Continue on CPAP At bedtime  With oxygen . Wear with Naps.  Activity as tolerated.  CT chest 1 year .  Follow up in 3 months with Dr. or Parrett NP .  Please contact office for sooner follow up if symptoms do not improve or worsen or seek emergency care       Follow Up Instructions:    I discussed the assessment and treatment plan with the patient. The patient was provided an opportunity to ask questions and all were answered. The patient agreed with the plan and demonstrated an understanding of the instructions.   The patient was advised to call back or seek an in-person evaluation if the symptoms worsen or if the condition fails to improve as anticipated.  I provided 28  minutes of non-face-to-face time during this encounter.   Vassie Loll, NP

## 2020-10-09 NOTE — Progress Notes (Signed)
Remote pacemaker transmission.   

## 2020-10-14 DIAGNOSIS — J449 Chronic obstructive pulmonary disease, unspecified: Secondary | ICD-10-CM | POA: Diagnosis not present

## 2020-10-16 ENCOUNTER — Telehealth: Payer: Self-pay | Admitting: Internal Medicine

## 2020-10-16 NOTE — Telephone Encounter (Signed)
LVM for pt to rtn my call to schedule AWV with [facility]. Please schedule if pt calls the office.  ?

## 2020-10-24 DIAGNOSIS — G4733 Obstructive sleep apnea (adult) (pediatric): Secondary | ICD-10-CM | POA: Diagnosis not present

## 2020-10-24 DIAGNOSIS — J449 Chronic obstructive pulmonary disease, unspecified: Secondary | ICD-10-CM | POA: Diagnosis not present

## 2020-10-24 DIAGNOSIS — E860 Dehydration: Secondary | ICD-10-CM | POA: Diagnosis not present

## 2020-10-24 DIAGNOSIS — J8410 Pulmonary fibrosis, unspecified: Secondary | ICD-10-CM | POA: Diagnosis not present

## 2020-10-25 ENCOUNTER — Telehealth: Payer: Self-pay | Admitting: Adult Health

## 2020-10-25 MED ORDER — ALBUTEROL SULFATE (2.5 MG/3ML) 0.083% IN NEBU: 2.5000 mg | INHALATION_SOLUTION | Freq: Four times a day (QID) | RESPIRATORY_TRACT | 12 refills | Status: DC | PRN

## 2020-10-25 MED ORDER — ALBUTEROL SULFATE HFA 108 (90 BASE) MCG/ACT IN AERS
2.0000 | INHALATION_SPRAY | Freq: Four times a day (QID) | RESPIRATORY_TRACT | 6 refills | Status: DC | PRN
Start: 2020-10-25 — End: 2022-06-27

## 2020-10-25 NOTE — Telephone Encounter (Signed)
Rx for albuterol inhaler and neb sol have been sent to preferred pharmacy for pt. Attempted to call pt but unable to reach. Left pt a detailed message letting him know that meds were sent in for him. Nothing further needed.

## 2020-10-25 NOTE — Telephone Encounter (Signed)
Yes that is fine to refill his albuterol inhaler and neb

## 2020-10-25 NOTE — Telephone Encounter (Signed)
Plan  Patient Instructions  Doxycycline 100mg  Twice daily  For 7 days.  Mucinex DM Twice daily  As needed  Cough/congestion .  Continue on Spiriva and Striverdi daily , rinse after use .  Continue on Oxygen 2l/m  Continue on CPAP At bedtime  With oxygen . Wear with Naps.  Activity as tolerated.  CT chest 1 year .  Follow up in 3 months with Dr. or Parrett NP .  Please contact office for sooner follow up if symptoms do not improve or worsen or seek emergency care   From OV on 10/09/2020 with TP   TP please advise on the refill of the rescue inhaler.   I did not see one on his med list.  Thanks

## 2020-10-29 ENCOUNTER — Telehealth: Payer: Self-pay

## 2020-10-29 NOTE — Telephone Encounter (Signed)
Patient called he needs to cancel his appointment for tomorrow due to not having transportation he would like to r/s call [phone]

## 2020-10-29 NOTE — Telephone Encounter (Signed)
Rescheduled

## 2020-10-30 ENCOUNTER — Encounter: Payer: Self-pay | Admitting: Vascular Surgery

## 2020-10-30 ENCOUNTER — Ambulatory Visit: Payer: Medicare PPO | Admitting: Physical Medicine and Rehabilitation

## 2020-10-30 ENCOUNTER — Ambulatory Visit: Payer: Medicare PPO | Admitting: Vascular Surgery

## 2020-10-30 ENCOUNTER — Other Ambulatory Visit: Payer: Self-pay

## 2020-10-30 VITALS — BP 139/69 | HR 73 | Temp 97.6°F | Resp 18 | Ht 74.0 in | Wt 219.0 lb

## 2020-10-30 DIAGNOSIS — I724 Aneurysm of artery of lower extremity: Secondary | ICD-10-CM

## 2020-10-30 NOTE — Progress Notes (Signed)
Patient name: [name] MRN: [id number] DOB: March 31, 1941 Sex: male  REASON FOR VISIT:.  Follow-up to discuss right popliteal aneurysm in setting of recent left leg bypass for occluded left popliteal aneurysm  HPI: [name] is a 80 y.o. male with multiple medical comorbidities who presents for further follow-up and to discuss intervention on his right popliteal aneurysm.  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  Of note he does have a history of an aortobiiliac bypass for AAA in 2004.  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  Overall he feels pretty worn down from his bypass surgery.  Left leg is doing well.  He has some questions about the leg swelling.  Rest pain in left leg resolved.  Past Medical History:  Diagnosis Date  . 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 Anxiety   . Aortic aneurysm (HCC) 09/02/2017  . BENIGN PROSTATIC HYPERTROPHY 11/23/2009  . Cardiomyopathy (HCC) 08/28/2016  . Chronic systolic CHF (congestive heart failure) (HCC) 09/02/2017  . COPD (chronic obstructive pulmonary disease) (HCC)   . CORONARY ARTERY DISEASE 11/23/2009  . 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 DEGENERATIVE JOINT DISEASE 11/23/2009  . DEPRESSION 11/23/2009  . FATIGUE 11/23/2009  . GAIT DISTURBANCE 12/10/2009  . HEMOPTYSIS UNSPECIFIED 05/07/2010  . High cholesterol   . HYPERTENSION 07/30/2009  . HYPOTHYROIDISM 07/30/2009  . Ischemic cardiomyopathy 09/02/2017  . 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 On home oxygen therapy    "2-3L; 24/7" (09/10/2016)  . OSA on CPAP   . Pneumonia   . PTSD (post-traumatic stress disorder) 03/10/2012  . 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 RA (rheumatoid arthritis) (HCC) 06/11/2011   "qwhere" (09/10/2016)  . RESPIRATORY FAILURE, CHRONIC 07/31/2009  .  Scleritis of both eyes 03/17/2014  . Thrombocytopenia (HCC)   . 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 Type II diabetes mellitus (HCC)     Past Surgical History:  Procedure Laterality Date  . 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 ABDOMINAL AORTOGRAM W/LOWER EXTREMITY N/A 08/16/2020   Procedure: ABDOMINAL AORTOGRAM W/LOWER EXTREMITY;  Surgeon: Cephus Shelling, MD;  Location: [facility] INVASIVE CV LAB;  Service: Cardiovascular;  Laterality: N/A;  . ABDOMINAL EXPLORATION SURGERY  02/2004   w/LOA/notes 12/10/2010; small bowel obstruction repair with adhesiolysis   . BACK SURGERY    . CARDIAC CATHETERIZATION     2 heart caths in the past.  One in 2000s showed one ulcerated plaque  Rx medically; [facility] [name] 09/05/2016  . CATARACT EXTRACTION W/ INTRAOCULAR LENS  IMPLANT, BILATERAL Bilateral 2000s  . COLECTOMY     hx of remote ileum resection due to bleeding  . COLONOSCOPY WITH PROPOFOL N/A 01/22/2018   Procedure: COLONOSCOPY WITH PROPOFOL;  Surgeon: Iva Boop, MD;  Location: [facility] ENDOSCOPY;  Service: Endoscopy;  Laterality: N/A;  . CORONARY ANGIOPLASTY WITH STENT PLACEMENT  09/10/2016  . CORONARY STENT INTERVENTION N/A 09/10/2016   Procedure: Coronary Stent Intervention;  Surgeon: Kathleene Hazel, MD;  Location: [facility] INVASIVE CV LAB;  Service: Cardiovascular;  Laterality: N/A;  Distal RCA 4.0x16 Synergy  . ESOPHAGOGASTRODUODENOSCOPY (EGD) WITH PROPOFOL N/A 01/22/2018   Procedure: ESOPHAGOGASTRODUODENOSCOPY (EGD) WITH PROPOFOL;  Surgeon: Iva Boop, MD;  Location: [facility] ENDOSCOPY;  Service: Endoscopy;  Laterality: N/A;  . EYE SURGERY    .  FALSE ANEURYSM REPAIR Left 08/22/2020   Procedure: EXCLUSION OF LEFT POPLITEAL ARTERY ANEURYSM;  Surgeon: Cephus Shelling, MD;  Location: [facility] OR;  Service: Vascular;  Laterality: Left;  . FEMORAL EMBOLOECTOMY Left 07/2000   with left leg ischemia; Dr. Hart Rochester, vascular  . FEMORAL-POPLITEAL BYPASS GRAFT Bilateral 08/22/2020   Procedure: LEFT  FEMORAL-POSTERIOR TIBIAL ARTERY BYPASS GRAFT USING RIGHT GREATER NONREVERSED SAPHENOUS VEIN GRAFT;  Surgeon: Cephus Shelling, MD;  Location: [facility] OR;  Service: Vascular;  Laterality: Bilateral;  . GANGLION CYST EXCISION Right    "wrist"; Dr. Teressa Senter  . HOT HEMOSTASIS N/A 01/22/2018   Procedure: HOT HEMOSTASIS (ARGON PLASMA COAGULATION/BICAP);  Surgeon: Iva Boop, MD;  Location: [facility] ENDOSCOPY;  Service: Endoscopy;  Laterality: N/A;  . LOOP RECORDER INSERTION N/A 04/25/2019   Procedure: LOOP RECORDER INSERTION;  Surgeon: Duke Salvia, MD;  Location: [facility] INVASIVE CV LAB;  Service: Cardiovascular;  Laterality: N/A;  . LOOP RECORDER REMOVAL N/A 07/01/2019   Procedure: LOOP RECORDER REMOVAL;  Surgeon: Duke Salvia, MD;  Location: [facility] INVASIVE CV LAB;  Service: Cardiovascular;  Laterality: N/A;  . LUMBAR LAMINECTOMY  1972   Dr. Fannie Knee  . PACEMAKER IMPLANT N/A 07/01/2019   Procedure: PACEMAKER IMPLANT;  Surgeon: Duke Salvia, MD;  Location: [facility] INVASIVE CV LAB;  Service: Cardiovascular;  Laterality: N/A;  . RIGHT/LEFT HEART CATH AND CORONARY ANGIOGRAPHY N/A 09/10/2016   Procedure: Right/Left Heart Cath and Coronary Angiography;  Surgeon: Kathleene Hazel, MD;  Location: [facility] INVASIVE CV LAB;  Service: Cardiovascular;  Laterality: N/A;  . TONSILLECTOMY      Family History  Problem Relation Age of Onset  . Other Mother        gun shot    SOCIAL HISTORY: Social History   Tobacco Use  . Smoking status: Former Smoker    Packs/day: 2.50    Years: 40.00    Pack years: 100.00    Types: Cigarettes, Pipe, Cigars    Quit date: 07/28/1998    Years since quitting: 22.2  . Smokeless tobacco: Never Used  Substance Use Topics  . Alcohol use: No    Alcohol/week: 0.0 standard drinks    No Known Allergies  Current Outpatient Medications  Medication Sig Dispense Refill  . albuterol (PROVENTIL) (2.5 MG/3ML) 0.083% nebulizer solution Take 3 mLs (2.5 mg total) by nebulization every 6 (six) hours  as needed for wheezing or shortness of breath. 120 mL 12  . albuterol (VENTOLIN HFA) 108 (90 Base) MCG/ACT inhaler Inhale 2 puffs into the lungs every 6 (six) hours as needed for wheezing or shortness of breath. 8 g 6  . atorvastatin (LIPITOR) 40 MG tablet Take 1 tablet (40 mg total) by mouth daily. 90 tablet 3  . Carboxymethylcellul-Glycerin (LUBRICATING EYE DROPS OP) Place 1 drop into both eyes 4 (four) times daily as needed (dry eyes).     . Cholecalciferol (VITAMIN D3) 2000 units capsule Take 2,000 Units by mouth daily.     . empagliflozin (JARDIANCE) 10 MG TABS tablet Take 5 mg by mouth daily. Filled at [place].    [name] gabapentin (NEURONTIN) 100 MG capsule Take 1 capsule (100 mg total) by mouth 2 (two) times daily. 90 capsule 0  . insulin glargine (LANTUS) 100 UNIT/ML injection Inject 60 Units into the skin at bedtime.     [name] levothyroxine (SYNTHROID) 150 MCG tablet Take 150 mcg by mouth daily before breakfast.    . losartan (COZAAR) 50 MG tablet Take 1 tablet (50 mg total) by mouth  daily. 90 tablet 3  . metFORMIN (GLUCOPHAGE) 500 MG tablet Take 500-1,000 mg by mouth 2 (two) times daily with a meal. Take 1 tablet (500 mg) by mouth in the morning & take 2 tablets (1000 mg) by mouth in the evening.    . Multiple Vitamins-Minerals (MEGA MULTIVITAMIN FOR MEN PO) Take 1 tablet by mouth daily.    . Olodaterol HCl (STRIVERDI RESPIMAT) 2.5 MCG/ACT AERS Inhale 1 puff into the lungs daily. (Patient taking differently: Inhale 1 puff into the lungs every evening.)    . OXYGEN Inhale 2 L into the lungs continuous.    . Semaglutide (OZEMPIC, 1 MG/DOSE, [place]) Inject 1 mg into the skin every Friday.    . tamsulosin (FLOMAX) 0.4 MG CAPS capsule Take 1 capsule (0.4 mg total) by mouth at bedtime. 30 capsule 0  . tiotropium (SPIRIVA HANDIHALER) 18 MCG inhalation capsule Place 1 capsule (18 mcg total) into inhaler and inhale daily. 30 capsule 4  . zolpidem (AMBIEN) 5 MG tablet Take 1 tablet (5 mg total) by mouth at bedtime  as needed for sleep. 30 tablet 0  . ALPRAZolam (XANAX) 0.5 MG tablet Take 0.5 mg by mouth at bedtime.  (Patient not taking: Reported on 10/30/2020)    . doxycycline (VIBRA-TABS) 100 MG tablet Take 1 tablet (100 mg total) by mouth 2 (two) times daily. (Patient not taking: Reported on 10/30/2020) 14 tablet 0  . furosemide (LASIX) 40 MG tablet Take 1 tablet (40 mg total) by mouth daily. (Patient taking differently: Take 40 mg by mouth daily as needed (fluid retention.).) 90 tablet 3  . Menthol, Topical Analgesic, (BIOFREEZE EX) Apply 1 application topically 3 (three) times daily as needed (pain. (calf muscle)). (Patient not taking: Reported on 10/30/2020)     No current facility-administered medications for this visit.    REVIEW OF SYSTEMS:  [X]  denotes positive finding, [ ]  denotes negative finding Cardiac  Comments:  Chest pain or chest pressure:    Shortness of breath upon exertion:    Short of breath when lying flat:    Irregular heart rhythm:        Vascular    Pain in calf, thigh, or hip brought on by ambulation:    Pain in feet at night that wakes you up from your sleep:     Blood clot in your veins:    Leg swelling:         Pulmonary    Oxygen at home:    Productive cough:     Wheezing:         Neurologic    Sudden weakness in arms or legs:     Sudden numbness in arms or legs:     Sudden onset of difficulty speaking or slurred speech:    Temporary loss of vision in one eye:     Problems with dizziness:         Gastrointestinal    Blood in stool:     Vomited blood:         Genitourinary    Burning when urinating:     Blood in urine:        Psychiatric    Major depression:         Hematologic    Bleeding problems:    Problems with blood clotting too easily:        Skin    Rashes or ulcers:        Constitutional    Fever or chills:  PHYSICAL EXAM: Vitals:   10/30/20 1514  BP: 139/69  Pulse: 73  Resp: 18  Temp: 97.6 F (36.4 C)  TempSrc: Temporal   SpO2: 95%  Weight: 219 lb (99.3 kg)  Height: 6\' 2"  (1.88 m)    GENERAL: The patient is a well-nourished male, in no acute distress. The vital signs are documented above. CARDIAC: There is a regular rate and rhythm.  VASCULAR:  Left PT triphasic signal Right leg vein harvest incisions healing appropriately Left groin and leg incisions healing appropriately Significant lower extremity edema .  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  Very pleased with his progress after left leg bypass for thrombosed popliteal aneurysm.  I previously discussed potentially a stent graft in the right leg for his 2.8 cm popliteal aneurysm.  On further review of his imaging I am a bit concerned about this for a number of reasons including he has had a previous aortobiiliac bypass and I would have to either come from his arm or antegrade in the right groin.  Unfortunately coming from his arm we do not have a delivery system long enough for Viabahn stent to the popliteal region from bracheal access.  Sticking his right groin antegrade would mean sticking a femoral aneurysm which could put him at high risk for complication.  In addition he has a high PT takeoff and so deploying the stent into normal artery below the aneurysm would have to cover the right posterior tibial artery.  The runoff in the anterior tibial peroneal looks very marginal and I am concerned about it supporting a covered stent.  I discussed all this with him today and I thought his best option would likely be a open bypass in the right leg as well.  He is simply not interested in that at this time which I understand given all of his comorbidities and he still recovering from his previous bypass.  I will see him again in 6 months with a bilateral lower extremity  arterial duplex.   08/24/2020, MD [facility] Office: [phone]

## 2020-10-31 ENCOUNTER — Ambulatory Visit: Payer: Medicare PPO

## 2020-10-31 NOTE — Progress Notes (Signed)
Patient Care Team: Myrlene Broker, MD as PCP - General (Internal Medicine) Ernesto Rutherford, MD as Consulting Physician (Ophthalmology) Kalman Shan, MD as Consulting Physician (Pulmonary Disease) System, Provider Not In as Consulting Physician Casimer Lanius, MD as Consulting Physician (Rheumatology) Oretha Milch, MD as Consulting Physician (Pulmonary Disease) Lewayne Bunting, MD as Consulting Physician (Cardiology)  DIAGNOSIS:    ICD-10-CM   1. 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 with above-mentioned history of ITPcurrently on treatment with prednisone5 mg daily.He presents to the clinic todayfor follow-up.   We stopped prednisone in March and he is here for a 1 month follow-up with labs.  He has not noticed any increased bruising or bleeding.  He has been to vascular surgery who are discussing what to do about his right leg.  Patient does not want to do another bypass surgery.  He thinks that the bypass surgery done on the left side has had complications that set him behind.  ALLERGIES:  has No Known Allergies.  MEDICATIONS:  Current Outpatient Medications  Medication Sig Dispense Refill  . albuterol (PROVENTIL) (2.5 MG/3ML) 0.083% nebulizer solution Take 3 mLs (2.5 mg total) by nebulization every 6 (six) hours as needed for wheezing or shortness of breath. 120 mL 12  . albuterol (VENTOLIN HFA) 108 (90 Base) MCG/ACT inhaler Inhale 2 puffs into the lungs every 6 (six) hours as needed for wheezing or shortness of breath. 8 g 6  . ALPRAZolam (XANAX) 0.5 MG tablet Take 0.5 mg by mouth at bedtime.  (Patient not taking: Reported on 10/30/2020)    . atorvastatin (LIPITOR) 40 MG tablet Take 1 tablet (40 mg total) by mouth daily. 90 tablet 3  . Carboxymethylcellul-Glycerin (LUBRICATING EYE DROPS OP) Place 1 drop into both eyes 4 (four) times daily as needed (dry eyes).     . Cholecalciferol  (VITAMIN D3) 2000 units capsule Take 2,000 Units by mouth daily.     [name] doxycycline (VIBRA-TABS) 100 MG tablet Take 1 tablet (100 mg total) by mouth 2 (two) times daily. (Patient not taking: Reported on 10/30/2020) 14 tablet 0  . empagliflozin (JARDIANCE) 10 MG TABS tablet Take 5 mg by mouth daily. Filled at [place].    . furosemide (LASIX) 40 MG tablet Take 1 tablet (40 mg total) by mouth daily. (Patient taking differently: Take 40 mg by mouth daily as needed (fluid retention.).) 90 tablet 3  . gabapentin (NEURONTIN) 100 MG capsule Take 1 capsule (100 mg total) by mouth 2 (two) times daily. 90 capsule 0  . insulin glargine (LANTUS) 100 UNIT/ML injection Inject 60 Units into the skin at bedtime.     [name] levothyroxine (SYNTHROID) 150 MCG tablet Take 150 mcg by mouth daily before breakfast.    . losartan (COZAAR) 50 MG tablet Take 1 tablet (50 mg total) by mouth daily. 90 tablet 3  . Menthol, Topical Analgesic, (BIOFREEZE EX) Apply 1 application topically 3 (three) times daily as needed (pain. (calf muscle)). (Patient not taking: Reported on 10/30/2020)    . metFORMIN (GLUCOPHAGE) 500 MG tablet Take 500-1,000 mg by mouth 2 (two) times daily with a meal. Take 1 tablet (500 mg) by mouth in the morning & take 2 tablets (1000 mg) by mouth in the evening.    . Multiple Vitamins-Minerals (MEGA MULTIVITAMIN FOR MEN PO) Take 1 tablet by mouth daily.    . Olodaterol HCl (STRIVERDI RESPIMAT) 2.5 MCG/ACT  AERS Inhale 1 puff into the lungs daily. (Patient taking differently: Inhale 1 puff into the lungs every evening.)    . OXYGEN Inhale 2 L into the lungs continuous.    . Semaglutide (OZEMPIC, 1 MG/DOSE, ) Inject 1 mg into the skin every Friday.    . tamsulosin (FLOMAX) 0.4 MG CAPS capsule Take 1 capsule (0.4 mg total) by mouth at bedtime. 30 capsule 0  . tiotropium (SPIRIVA HANDIHALER) 18 MCG inhalation capsule Place 1 capsule (18 mcg total) into inhaler and inhale daily. 30 capsule 4  . zolpidem (AMBIEN) 5 MG tablet Take  1 tablet (5 mg total) by mouth at bedtime as needed for sleep. 30 tablet 0   No current facility-administered medications for this visit.    PHYSICAL EXAMINATION: ECOG PERFORMANCE STATUS: 2 - Symptomatic, <50% confined to bed  Vitals:   11/01/20 1100  BP: (!) 147/88  Pulse: 80  Resp: 20  Temp: 97.7 F (36.5 C)  SpO2: 100%   Filed Weights   11/01/20 1100  Weight: 222 lb 6.4 oz (100.9 kg)    LABORATORY DATA:  I have reviewed the data as listed CMP Latest Ref Rng & Units 08/26/2020 08/25/2020 08/24/2020  Glucose 70 - 99 mg/dL 854(O) 91 96  BUN 8 - 23 mg/dL 23 27(O) 35(K)  Creatinine 0.61 - 1.24 mg/dL 0.93 8.18(E) 9.93(Z)  Sodium 135 - 145 mmol/L 143 141 141  Potassium 3.5 - 5.1 mmol/L 4.0 3.8 4.0  Chloride 98 - 111 mmol/L 107 107 106  CO2 22 - 32 mmol/L 28 26 26   Calcium 8.9 - 10.3 mg/dL 8.9 ) 1.6(R)  Total Protein 6.5 - 8.1 g/dL - - -  Total Bilirubin 0.3 - 1.2 mg/dL - - -  Alkaline Phos 38 - 126 U/L - - -  AST 15 - 41 U/L - - -  ALT 0 - 44 U/L - - -    Lab Results  Component Value Date   WBC 5.8 11/01/2020   HGB 10.8 (L) 11/01/2020   HCT 36.2 (L) 11/01/2020   MCV 84.4 11/01/2020   PLT 37 (L) 11/01/2020   NEUTROABS 3.3 11/01/2020    ASSESSMENT & PLAN:  Acute ITP (HCC) Relapsed acute ITP Prior treatment: Prednisone started 07/05/2019 completed 10/23/2019 Lab review: 02/08/2020:Platelet count 37 with blood with expectorationstarted on prednisone 02/15/2020:Platelet count 98 03/19/2020: Platelet count:68 04/16/2020: Platelet count64(prednisone dose lowered to 5 mg) 06/25/2020: Platelets 38 07/09/2020: Platelets 73 (continue with 5 mg prednisone) 08/26/2020: Platelets 73 3/22: Platelets 67 (prednisone discontinued) 11/01/2020: Platelets 37 (prednisone 5 mg restarted)  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  He has a stent plan for the right leg coming  up.  Completed Prednisone 10/04/20 With a decrease in the platelet count, I recommended resuming prednisone 5 mg daily. Recheck labs in 4 weeks and follow-up after that.   No orders of the defined types were placed in this encounter.  The patient has a good understanding of the overall plan. he agrees with it. he will call with any problems that may develop before the next visit here.  Total time spent: 20 mins including face to face time and time spent for planning, charting and coordination of care  12/04/20, MD, MPH 11/01/2020  I, Molly Dorshimer, am acting as scribe for Dr. 01/01/2021.  I have reviewed the above documentation for accuracy and completeness, and I agree with the above.

## 2020-10-31 NOTE — Assessment & Plan Note (Signed)
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  He has a stent plan for the right leg coming up.  Completed Prednisone 10/04/20

## 2020-11-01 ENCOUNTER — Ambulatory Visit: Payer: Medicare PPO

## 2020-11-01 ENCOUNTER — Other Ambulatory Visit: Payer: Self-pay

## 2020-11-01 ENCOUNTER — Ambulatory Visit: Payer: Medicare PPO | Admitting: Internal Medicine

## 2020-11-01 ENCOUNTER — Telehealth: Payer: Self-pay | Admitting: Hematology and Oncology

## 2020-11-01 ENCOUNTER — Inpatient Hospital Stay: Payer: Medicare PPO | Attending: Hematology and Oncology | Admitting: Hematology and Oncology

## 2020-11-01 ENCOUNTER — Inpatient Hospital Stay: Payer: Medicare PPO

## 2020-11-01 DIAGNOSIS — I724 Aneurysm of artery of lower extremity: Secondary | ICD-10-CM

## 2020-11-01 DIAGNOSIS — D693 Immune thrombocytopenic purpura: Secondary | ICD-10-CM | POA: Diagnosis not present

## 2020-11-01 DIAGNOSIS — Z79899 Other long term (current) drug therapy: Secondary | ICD-10-CM | POA: Insufficient documentation

## 2020-11-01 LAB — CBC WITH DIFFERENTIAL (CANCER CENTER ONLY)
Abs Immature Granulocytes: 0.01 10*3/uL (ref 0.00–0.07)
Basophils Absolute: 0 10*3/uL (ref 0.0–0.1)
Basophils Relative: 1 %
Eosinophils Absolute: 0.2 10*3/uL (ref 0.0–0.5)
Eosinophils Relative: 4 %
HCT: 36.2 % — ABNORMAL LOW (ref 39.0–52.0)
Hemoglobin: 10.8 g/dL — ABNORMAL LOW (ref 13.0–17.0)
Immature Granulocytes: 0 %
Lymphocytes Relative: 27 %
Lymphs Abs: 1.5 10*3/uL (ref 0.7–4.0)
MCH: 25.2 pg — ABNORMAL LOW (ref 26.0–34.0)
MCHC: 29.8 g/dL — ABNORMAL LOW (ref 30.0–36.0)
MCV: 84.4 fL (ref 80.0–100.0)
Monocytes Absolute: 0.7 10*3/uL (ref 0.1–1.0)
Monocytes Relative: 12 %
Neutro Abs: 3.3 10*3/uL (ref 1.7–7.7)
Neutrophils Relative %: 56 %
Platelet Count: 37 10*3/uL — ABNORMAL LOW (ref 150–400)
RBC: 4.29 MIL/uL (ref 4.22–5.81)
RDW: 15.1 % (ref 11.5–15.5)
WBC Count: 5.8 10*3/uL (ref 4.0–10.5)
nRBC: 0 % (ref 0.0–0.2)

## 2020-11-01 MED ORDER — PREDNISONE 5 MG PO TABS: 5.0000 mg | ORAL_TABLET | Freq: Every day | ORAL | 3 refills | Status: DC

## 2020-11-01 NOTE — Telephone Encounter (Signed)
Scheduled appt per 4/7 los. Pt aware.  

## 2020-11-05 ENCOUNTER — Ambulatory Visit: Payer: Medicare PPO | Admitting: Internal Medicine

## 2020-11-05 ENCOUNTER — Ambulatory Visit: Payer: Medicare PPO

## 2020-11-05 ENCOUNTER — Encounter: Payer: Self-pay | Admitting: Internal Medicine

## 2020-11-05 ENCOUNTER — Other Ambulatory Visit: Payer: Self-pay

## 2020-11-05 VITALS — BP 128/84 | HR 100 | Temp 97.8°F | Resp 18 | Ht 74.0 in | Wt 214.4 lb

## 2020-11-05 DIAGNOSIS — I5022 Chronic systolic (congestive) heart failure: Secondary | ICD-10-CM

## 2020-11-05 DIAGNOSIS — I712 Thoracic aortic aneurysm, without rupture, unspecified: Secondary | ICD-10-CM

## 2020-11-05 DIAGNOSIS — E118 Type 2 diabetes mellitus with unspecified complications: Secondary | ICD-10-CM

## 2020-11-05 DIAGNOSIS — J9611 Chronic respiratory failure with hypoxia: Secondary | ICD-10-CM | POA: Diagnosis not present

## 2020-11-05 DIAGNOSIS — E039 Hypothyroidism, unspecified: Secondary | ICD-10-CM

## 2020-11-05 DIAGNOSIS — I739 Peripheral vascular disease, unspecified: Secondary | ICD-10-CM

## 2020-11-05 DIAGNOSIS — Z0001 Encounter for general adult medical examination with abnormal findings: Secondary | ICD-10-CM | POA: Insufficient documentation

## 2020-11-05 DIAGNOSIS — I10 Essential (primary) hypertension: Secondary | ICD-10-CM

## 2020-11-05 DIAGNOSIS — Z0000 Encounter for general adult medical examination without abnormal findings: Secondary | ICD-10-CM | POA: Diagnosis not present

## 2020-11-05 DIAGNOSIS — Z794 Long term (current) use of insulin: Secondary | ICD-10-CM

## 2020-11-05 DIAGNOSIS — E785 Hyperlipidemia, unspecified: Secondary | ICD-10-CM

## 2020-11-05 DIAGNOSIS — J449 Chronic obstructive pulmonary disease, unspecified: Secondary | ICD-10-CM | POA: Diagnosis not present

## 2020-11-05 DIAGNOSIS — D693 Immune thrombocytopenic purpura: Secondary | ICD-10-CM | POA: Diagnosis not present

## 2020-11-05 DIAGNOSIS — E1169 Type 2 diabetes mellitus with other specified complication: Secondary | ICD-10-CM

## 2020-11-05 NOTE — Assessment & Plan Note (Signed)
Overall is worsening mildly. He is taking striverdi and spiriva currently. We will monitor a couple of weeks as resumption of prednisone 5 mg daily for ITP may help respiratory function also.

## 2020-11-05 NOTE — Assessment & Plan Note (Signed)
Stable on imaging recently and recommended bi-annual follow up.

## 2020-11-05 NOTE — Assessment & Plan Note (Signed)
Stable overall, taking striverdi and spiriva. Using oxygen all the time.

## 2020-11-05 NOTE — Patient Instructions (Addendum)
Tylenol the limit is 6 pills of the 500 mg tablet per day.    Health Maintenance, Male Adopting a healthy lifestyle and getting preventive care are important in promoting health and wellness. Ask your health care provider about:  The right schedule for you to have regular tests and exams.  Things you can do on your own to prevent diseases and keep yourself healthy. What should I know about diet, weight, and exercise? Eat a healthy diet  Eat a diet that includes plenty of vegetables, fruits, low-fat dairy products, and lean protein.  Do not eat a lot of foods that are high in solid fats, added sugars, or sodium.   Maintain a healthy weight Body mass index (BMI) is a measurement that can be used to identify possible weight problems. It estimates body fat based on height and weight. Your health care provider can help determine your BMI and help you achieve or maintain a healthy weight. Get regular exercise Get regular exercise. This is one of the most important things you can do for your health. Most adults should:  Exercise for at least 150 minutes each week. The exercise should increase your heart rate and make you sweat (moderate-intensity exercise).  Do strengthening exercises at least twice a week. This is in addition to the moderate-intensity exercise.  Spend less time sitting. Even light physical activity can be beneficial. Watch cholesterol and blood lipids Have your blood tested for lipids and cholesterol at 79 years of age, then have this test every 5 years. You may need to have your cholesterol levels checked more often if:  Your lipid or cholesterol levels are high.  You are older than 79 years of age.  You are at high risk for heart disease. What should I know about cancer screening? Many types of cancers can be detected early and may often be prevented. Depending on your health history and family history, you may need to have cancer screening at various ages. This may  include screening for:  Colorectal cancer.  Prostate cancer.  Skin cancer.  Lung cancer. What should I know about heart disease, diabetes, and high blood pressure? Blood pressure and heart disease  High blood pressure causes heart disease and increases the risk of stroke. This is more likely to develop in people who have high blood pressure readings, are of African descent, or are overweight.  Talk with your health care provider about your target blood pressure readings.  Have your blood pressure checked: ? Every 3-5 years if you are 71-73 years of age. ? Every year if you are 57 years old or older.  If you are between the ages of 52 and 66 and are a current or former smoker, ask your health care provider if you should have a one-time screening for abdominal aortic aneurysm (AAA). Diabetes Have regular diabetes screenings. This checks your fasting blood sugar level. Have the screening done:  Once every three years after age 22 if you are at a normal weight and have a low risk for diabetes.  More often and at a younger age if you are overweight or have a high risk for diabetes. What should I know about preventing infection? Hepatitis B If you have a higher risk for hepatitis B, you should be screened for this virus. Talk with your health care provider to find out if you are at risk for hepatitis B infection. Hepatitis C Blood testing is recommended for:  Everyone born from [birth year] through 1964.  Anyone with  known risk factors for hepatitis C. Sexually transmitted infections (STIs)  You should be screened each year for STIs, including gonorrhea and chlamydia, if: ? You are sexually active and are younger than 79 years of age. ? You are older than 79 years of age and your health care provider tells you that you are at risk for this type of infection. ? Your sexual activity has changed since you were last screened, and you are at increased risk for chlamydia or gonorrhea. Ask your  health care provider if you are at risk.  Ask your health care provider about whether you are at high risk for HIV. Your health care provider may recommend a prescription medicine to help prevent HIV infection. If you choose to take medicine to prevent HIV, you should first get tested for HIV. You should then be tested every 3 months for as long as you are taking the medicine. Follow these instructions at home: Lifestyle  Do not use any products that contain nicotine or tobacco, such as cigarettes, e-cigarettes, and chewing tobacco. If you need help quitting, ask your health care provider.  Do not use street drugs.  Do not share needles.  Ask your health care provider for help if you need support or information about quitting drugs. Alcohol use  Do not drink alcohol if your health care provider tells you not to drink.  If you drink alcohol: ? Limit how much you have to 0-2 drinks a day. ? Be aware of how much alcohol is in your drink. In the [place] one drink equals one 12 oz bottle of beer (355 mL), one 5 oz glass of wine (148 mL), or one 1 oz glass of hard liquor (44 mL). General instructions  Schedule regular health, dental, and eye exams.  Stay current with your vaccines.  Tell your health care provider if: ? You often feel depressed. ? You have ever been abused or do not feel safe at home. Summary  Adopting a healthy lifestyle and getting preventive care are important in promoting health and wellness.  Follow your health care provider's instructions about healthy diet, exercising, and getting tested or screened for diseases.  Follow your health care provider's instructions on monitoring your cholesterol and blood pressure. This information is not intended to replace advice given to you by your health care provider. Make sure you discuss any questions you have with your health care provider. Document Revised: 07/07/2018 Document Reviewed: 07/07/2018 Elsevier Patient Education   2021 [employer].

## 2020-11-05 NOTE — Assessment & Plan Note (Signed)
Taking lipitor 40 mg daily. Recent lipid panel at goal.

## 2020-11-05 NOTE — Assessment & Plan Note (Addendum)
BP at goal on losartan, lasix (prn). Recent CMP okay.

## 2020-11-05 NOTE — Progress Notes (Signed)
Subjective:   Patient ID: [name], male    DOB: January 12, 1941, 80 y.o.   MRN: [id number]  HPI Here for medicare wellness and physical, no new complaints. Please see A/P for status and treatment of chronic medical problems.   Diet: DM since diabetic Physical activity: sedentary Depression/mood screen: negative Hearing: intact to whispered voice, mild loss bilaterally Visual acuity: grossly normal with lens, performs annual eye exam having next week ADLs: capable Fall risk: low, cane for ambulation Home safety: good Cognitive evaluation: intact to orientation, naming, recall and repetition EOL planning: adv directives discussed  Flowsheet Row Office Visit from 11/05/2020 in [facility] at [employer]  PHQ-2 Total Score 0      Flowsheet Row Office Visit from 01/25/2019 in [facility]  PHQ-9 Total Score 1      I have personally reviewed and have noted 1. The patient's medical and social history - reviewed today no changes 2. Their use of alcohol, tobacco or illicit drugs 3. Their current medications and supplements 4. The patient's functional ability including ADL's, fall risks, home safety risks and hearing or visual impairment. 5. Diet and physical activities 6. Evidence for depression or mood disorders 7. Care team reviewed and updated 8.  The patient is not  on an opioid pain medication.  Patient Care Team: Myrlene Broker, MD as PCP - General (Internal Medicine) Ernesto Rutherford, MD as Consulting Physician (Ophthalmology) Kalman Shan, MD as Consulting Physician (Pulmonary Disease) System, Provider Not In as Consulting Physician Casimer Lanius, MD as Consulting Physician (Rheumatology) Oretha Milch, MD as Consulting Physician (Pulmonary Disease) Lewayne Bunting, MD as Consulting Physician (Cardiology) Past Medical History:  Diagnosis Date  . 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 Anxiety   . Aortic aneurysm  (HCC) 09/02/2017  . BENIGN PROSTATIC HYPERTROPHY 11/23/2009  . Cardiomyopathy (HCC) 08/28/2016  . Chronic systolic CHF (congestive heart failure) (HCC) 09/02/2017  . COPD (chronic obstructive pulmonary disease) (HCC)   . CORONARY ARTERY DISEASE 11/23/2009  . 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 DEGENERATIVE JOINT DISEASE 11/23/2009  . DEPRESSION 11/23/2009  . FATIGUE 11/23/2009  . GAIT DISTURBANCE 12/10/2009  . HEMOPTYSIS UNSPECIFIED 05/07/2010  . High cholesterol   . HYPERTENSION 07/30/2009  . HYPOTHYROIDISM 07/30/2009  . Ischemic cardiomyopathy 09/02/2017  . 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 On home oxygen therapy    "2-3L; 24/7" (09/10/2016)  . OSA on CPAP   . Pneumonia   . PTSD (post-traumatic stress disorder) 03/10/2012  . 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 RA (rheumatoid arthritis) (HCC) 06/11/2011   "qwhere" (09/10/2016)  . RESPIRATORY FAILURE, CHRONIC 07/31/2009  . Scleritis of both eyes 03/17/2014  . Thrombocytopenia (HCC)   . 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 Type II diabetes mellitus (HCC)    Past Surgical History:  Procedure Laterality Date  . 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 ABDOMINAL AORTOGRAM W/LOWER EXTREMITY N/A 08/16/2020   Procedure: ABDOMINAL AORTOGRAM W/LOWER EXTREMITY;  Surgeon: Cephus Shelling, MD;  Location: [facility] INVASIVE CV LAB;  Service: Cardiovascular;  Laterality: N/A;  . ABDOMINAL EXPLORATION SURGERY  02/2004   w/LOA/notes 12/10/2010; small bowel obstruction repair with adhesiolysis   . BACK SURGERY    . CARDIAC CATHETERIZATION     2 heart caths in the past.  One in 2000s showed one ulcerated plaque  Rx medically; [facility] [name] 09/05/2016  . CATARACT EXTRACTION W/ INTRAOCULAR LENS  IMPLANT, BILATERAL Bilateral 2000s  .  COLECTOMY     hx of remote ileum resection due to bleeding  . COLONOSCOPY WITH PROPOFOL N/A 01/22/2018   Procedure: COLONOSCOPY WITH PROPOFOL;  Surgeon: Iva Boop, MD;  Location: [facility] ENDOSCOPY;  Service: Endoscopy;   Laterality: N/A;  . CORONARY ANGIOPLASTY WITH STENT PLACEMENT  09/10/2016  . CORONARY STENT INTERVENTION N/A 09/10/2016   Procedure: Coronary Stent Intervention;  Surgeon: Kathleene Hazel, MD;  Location[facility] INVASIVE CV LAB;  Service: Cardiovascular;  Laterality: N/A;  Distal RCA 4.0x16 Synergy  . ESOPHAGOGASTRODUODENOSCOPY (EGD) WITH PROPOFOL N/A 01/22/2018   Procedure: ESOPHAGOGASTRODUODENOSCOPY (EGD) WITH PROPOFOL;  Surgeon: Iva Boop, MD;  Location: [facility] ENDOSCOPY;  Service: Endoscopy;  Laterality: N/A;  . EYE SURGERY    . FALSE ANEURYSM REPAIR Left 08/22/2020   Procedure: EXCLUSION OF LEFT POPLITEAL ARTERY ANEURYSM;  Surgeon: Cephus Shelling, MD;  Location: [facility] OR;  Service: Vascular;  Laterality: Left;  . FEMORAL EMBOLOECTOMY Left 07/2000   with left leg ischemia; Dr. Hart Rochester, vascular  . FEMORAL-POPLITEAL BYPASS GRAFT Bilateral 08/22/2020   Procedure: LEFT FEMORAL-POSTERIOR TIBIAL ARTERY BYPASS GRAFT USING RIGHT GREATER NONREVERSED SAPHENOUS VEIN GRAFT;  Surgeon: Cephus Shelling, MD;  Location: [facility] OR;  Service: Vascular;  Laterality: Bilateral;  . GANGLION CYST EXCISION Right    "wrist"; Dr. Teressa Senter  . HOT HEMOSTASIS N/A 01/22/2018   Procedure: HOT HEMOSTASIS (ARGON PLASMA COAGULATION/BICAP);  Surgeon: Iva Boop, MD;  Location: [facility] ENDOSCOPY;  Service: Endoscopy;  Laterality: N/A;  . LOOP RECORDER INSERTION N/A 04/25/2019   Procedure: LOOP RECORDER INSERTION;  Surgeon: Duke Salvia, MD;  Location: [facility] INVASIVE CV LAB;  Service: Cardiovascular;  Laterality: N/A;  . LOOP RECORDER REMOVAL N/A 07/01/2019   Procedure: LOOP RECORDER REMOVAL;  Surgeon: Duke Salvia, MD;  Location: [facility] INVASIVE CV LAB;  Service: Cardiovascular;  Laterality: N/A;  . LUMBAR LAMINECTOMY  1972   Dr. Fannie Knee  . PACEMAKER IMPLANT N/A 07/01/2019   Procedure: PACEMAKER IMPLANT;  Surgeon: Duke Salvia, MD;  Location: [facility] INVASIVE CV LAB;  Service: Cardiovascular;  Laterality: N/A;  . RIGHT/LEFT HEART  CATH AND CORONARY ANGIOGRAPHY N/A 09/10/2016   Procedure: Right/Left Heart Cath and Coronary Angiography;  Surgeon: Kathleene Hazel, MD;  Location: [facility] INVASIVE CV LAB;  Service: Cardiovascular;  Laterality: N/A;  . TONSILLECTOMY     Family History  Problem Relation Age of Onset  . Other Mother        gun shot   Review of Systems  Constitutional: Positive for activity change and fatigue.  HENT: Negative.   Eyes: Negative.   Respiratory: Positive for shortness of breath. Negative for cough and chest tightness.   Cardiovascular: Positive for leg swelling. Negative for chest pain and palpitations.  Gastrointestinal: Negative for abdominal distention, abdominal pain, constipation, diarrhea, nausea and vomiting.  Musculoskeletal: Positive for arthralgias and myalgias.  Skin: Negative.   Neurological: Negative.   Psychiatric/Behavioral: Negative.     Objective:  Physical Exam Constitutional:      Appearance: He is well-developed.  HENT:     Head: Normocephalic and atraumatic.  Cardiovascular:     Rate and Rhythm: Normal rate and regular rhythm.  Pulmonary:     Effort: Pulmonary effort is normal. No respiratory distress.     Breath sounds: Normal breath sounds. No wheezing or rales.     Comments: On oxygen continuous Abdominal:     General: Bowel sounds are normal. There is no distension.     Palpations: Abdomen is soft.  Tenderness: There is no abdominal tenderness. There is no rebound.  Musculoskeletal:        General: Tenderness present.     Cervical back: Normal range of motion.  Skin:    General: Skin is warm and dry.     Comments: Foot exam done  Neurological:     Mental Status: He is alert and oriented to person, place, and time.     Coordination: Coordination normal.     Vitals:   11/05/20 0831  BP: 128/84  Pulse: 100  Resp: 18  Temp: 97.8 F (36.6 C)  TempSrc: Oral  SpO2: 95%  Weight: 214 lb 6.4 oz (97.3 kg)  Height: 6\' 2"  (1.88 m)    This  visit occurred during the SARS-CoV-2 public health emergency.  Safety protocols were in place, including screening questions prior to the visit, additional usage of staff PPE, and extensive cleaning of exam room while observing appropriate contact time as indicated for disinfecting solutions.   Assessment & Plan:

## 2020-11-05 NOTE — Assessment & Plan Note (Signed)
Endo is managing his synthroid 150 mcg daily and checking levels.

## 2020-11-05 NOTE — Assessment & Plan Note (Signed)
Uses lasix prn. No signs of flare today. On jardiance and ARB.

## 2020-11-05 NOTE — Assessment & Plan Note (Signed)
Present still but improved since bypass recently.

## 2020-11-05 NOTE — Assessment & Plan Note (Addendum)
Foot exam done today. Seeing endo for management and HgA1c. We did talk today about how his sugars will likely increase now that he is back on the prednisone 5 mg daily. Taking metformin, jardiance, ozempic, insulin. On ARB and statin.

## 2020-11-05 NOTE — Assessment & Plan Note (Signed)
With decline in platelets he has been resumed on prednisone 5 mg daily. This will impact his sugar levels.

## 2020-11-05 NOTE — Assessment & Plan Note (Signed)
Flu shot up to date. Covid-19 up to date 3 shots. Pneumonia complete. Shingrix counseled to get at pharmacy. Tetanus due end of 2022. Colonoscopy aged out. Counseled about sun safety and mole surveillance. Counseled about the dangers of distracted driving. Given 10 year screening recommendations.

## 2020-11-08 ENCOUNTER — Ambulatory Visit: Payer: Medicare PPO | Admitting: Physical Medicine and Rehabilitation

## 2020-11-12 ENCOUNTER — Telehealth: Payer: Self-pay | Admitting: Adult Health

## 2020-11-12 NOTE — Telephone Encounter (Signed)
I have called and spoke with pt and he is scheduled tomorrow with JE at 4 and he is aware to come in at 345 to have cxr prior to visit.  I offered appt today but he declined.     Pt stated that he has been coughing up some blood for about a week.  He said that it normally happens when he is clearing his throat.  Not sure if this is related to the procedure that he had back in march.  He said that it is dark in color.  Never been bright red. He stated that it is stringy at times and other times it comes out like 2 clumps that are about the size of a dime.  He does not take any blood thinners.  Will forward to RA  to advise if anything further needs to be done prior to appt tomorrow.  Thanks

## 2020-11-12 NOTE — Telephone Encounter (Signed)
I called and spoke with pt and advised him of RA recs.  He changed his appt to see RA on 04/20 since he has not seen RA in a year or so.  Nothing further is needed and pt is aware to come early for his CXR.

## 2020-11-12 NOTE — Telephone Encounter (Signed)
He is not on any blood thinners. So no new recommendations prior to being seen. I can see him on Wednesday at 9 AM

## 2020-11-13 ENCOUNTER — Other Ambulatory Visit: Payer: Self-pay

## 2020-11-13 ENCOUNTER — Ambulatory Visit: Payer: Self-pay | Admitting: Pulmonary Disease

## 2020-11-13 DIAGNOSIS — R042 Hemoptysis: Secondary | ICD-10-CM

## 2020-11-14 ENCOUNTER — Telehealth: Payer: Self-pay | Admitting: Pulmonary Disease

## 2020-11-14 ENCOUNTER — Ambulatory Visit (INDEPENDENT_AMBULATORY_CARE_PROVIDER_SITE_OTHER): Payer: Medicare PPO

## 2020-11-14 ENCOUNTER — Ambulatory Visit: Payer: Medicare PPO | Admitting: Pulmonary Disease

## 2020-11-14 ENCOUNTER — Encounter: Payer: Self-pay | Admitting: Pulmonary Disease

## 2020-11-14 ENCOUNTER — Other Ambulatory Visit: Payer: Self-pay

## 2020-11-14 VITALS — BP 126/80 | HR 66 | Temp 97.3°F | Ht 74.0 in | Wt 217.0 lb

## 2020-11-14 DIAGNOSIS — G4733 Obstructive sleep apnea (adult) (pediatric): Secondary | ICD-10-CM

## 2020-11-14 DIAGNOSIS — Z9989 Dependence on other enabling machines and devices: Secondary | ICD-10-CM | POA: Diagnosis not present

## 2020-11-14 DIAGNOSIS — J189 Pneumonia, unspecified organism: Secondary | ICD-10-CM | POA: Diagnosis not present

## 2020-11-14 DIAGNOSIS — J449 Chronic obstructive pulmonary disease, unspecified: Secondary | ICD-10-CM

## 2020-11-14 DIAGNOSIS — R042 Hemoptysis: Secondary | ICD-10-CM

## 2020-11-14 DIAGNOSIS — J439 Emphysema, unspecified: Secondary | ICD-10-CM | POA: Diagnosis not present

## 2020-11-14 DIAGNOSIS — R918 Other nonspecific abnormal finding of lung field: Secondary | ICD-10-CM

## 2020-11-14 LAB — BASIC METABOLIC PANEL
BUN: 22 mg/dL (ref 6–23)
CO2: 29 mEq/L (ref 19–32)
Calcium: 9.5 mg/dL (ref 8.4–10.5)
Chloride: 102 mEq/L (ref 96–112)
Creatinine, Ser: 1.31 mg/dL (ref 0.40–1.50)
GFR: 51.88 mL/min — ABNORMAL LOW (ref >60.00)
Glucose, Bld: 187 mg/dL — ABNORMAL HIGH (ref 70–99)
Potassium: 4.7 mEq/L (ref 3.5–5.1)
Sodium: 141 mEq/L (ref 135–145)

## 2020-11-14 LAB — CBC WITH DIFFERENTIAL/PLATELET
Basophils Absolute: 0.1 10*3/uL (ref 0.0–0.1)
Basophils Relative: 0.6 % (ref 0.0–3.0)
Eosinophils Absolute: 0.1 10*3/uL (ref 0.0–0.7)
Eosinophils Relative: 1.2 % (ref 0.0–5.0)
HCT: 37.8 % — ABNORMAL LOW (ref 39.0–52.0)
Hemoglobin: 12.1 g/dL — ABNORMAL LOW (ref 13.0–17.0)
Lymphocytes Relative: 12.3 % (ref 12.0–46.0)
Lymphs Abs: 1.1 10*3/uL (ref 0.7–4.0)
MCHC: 32.1 g/dL (ref 30.0–36.0)
MCV: 80 fl (ref 78.0–100.0)
Monocytes Absolute: 0.8 10*3/uL (ref 0.1–1.0)
Monocytes Relative: 8.7 % (ref 3.0–12.0)
Neutro Abs: 6.7 10*3/uL (ref 1.4–7.7)
Neutrophils Relative %: 77.2 % — ABNORMAL HIGH (ref 43.0–77.0)
Platelets: 44 10*3/uL — CL (ref 150.0–400.0)
RBC: 4.73 Mil/uL (ref 4.22–5.81)
RDW: 16.3 % — ABNORMAL HIGH (ref 11.5–15.5)
WBC: 8.7 10*3/uL (ref 4.0–10.5)

## 2020-11-14 MED ORDER — PREDNISONE 10 MG PO TABS: 10.0000 mg | ORAL_TABLET | Freq: Every day | ORAL | 0 refills | Status: DC

## 2020-11-14 MED ORDER — LEVOFLOXACIN 500 MG PO TABS: 500.0000 mg | ORAL_TABLET | Freq: Every day | ORAL | 0 refills | Status: DC

## 2020-11-14 MED ORDER — STIOLTO RESPIMAT 2.5-2.5 MCG/ACT IN AERS: 2.0000 | INHALATION_SPRAY | Freq: Every day | RESPIRATORY_TRACT | 0 refills | Status: DC

## 2020-11-14 MED ORDER — PREDNISONE 10 MG PO TABS: ORAL_TABLET | ORAL | 0 refills | Status: DC

## 2020-11-14 NOTE — Assessment & Plan Note (Signed)
Review of his CT chest from 09/2020 shows that nodules have actually shrunk in size compared to prior and there is some residual scarring.  As such I am not worried about lung malignancy here, will need to follow-up current right-sided infiltrate to resolution

## 2020-11-14 NOTE — Telephone Encounter (Signed)
Okay.  Please let patient know Count was 34K last week so slightly better. I have increased his prednisone

## 2020-11-14 NOTE — Progress Notes (Signed)
Subjective:    Patient ID: [name], male    DOB: [birth year], 80 y.o.   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 Cough    Reports no improvement since onset cough with blood x 1 week   S/p left common femoral to popliteal bypass on August 22, 2020 for a thrombosed left popliteal artery aneurysm.    Last virtual visit 10/09/20 reviewed >> doxy   He was treated for left upper lobe pneumonia in June 2018, this seemed to resolve but there was some residual density in the left upper lobe.PET scan showed hypermetabolic left upper lobe platelike density SUV 5.5, but follow-up scans have shown that this nodule has shrunk in size. He called a few days ago when he developed blood-tinged sputum, blood was dark red and occasional bright red never more than a teaspoon a day.  Last 2 days have decreased and today he did not have any. 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 He denies bruising or petechiae, dark stools His breathing is slightly worse. Obtain chest x-ray today and this shows extensive right-sided infiltrate to my independent review  He denies fevers or sick contacts  Significant tests/ events reviewed  PSG 04/2014 showed severe OSA, AHI 48/h with nadir desatn 78% correctd by CPAP 12 cm, 3 L O2 blended in , c flex +2 cm, humidity, A medium full face mask was used.  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   PFT 10/2015 FEV1 was 36%, ratio 46, FVC 58%, DLCO 26%   CT chest 12/2016 -masslike consolidation in LUL   PET scan 03/20/17 >+metabolic act in LUL consolidation  CT chest 08/2017 >>2.4 x 3.5 cm triangular subpleural opacity in the anterior left upper lobe > has resolved the platelike scarring bilateral nodular infiltrates stable,  CT chest 09/2020 severe emphysema, extensive pleural calcification.  Previous nodule in the right lower lobe has significantly decreased in size measuring 0.8 x 0.8 cm previously at 2.3 cm.  A stable left upper lobe cavitary nodule measuring 4 mm.  And a stable irregular nodule in the left lower lobe measuring 1.5 cm, and a left lower lobe nodule measuring 1.5 cm   Past Medical History:  Diagnosis Date  . 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 Anxiety   . Aortic aneurysm (HCC) 09/02/2017  . BENIGN PROSTATIC HYPERTROPHY 11/23/2009  . Cardiomyopathy (HCC) 08/28/2016  . Chronic systolic CHF (congestive heart failure) (HCC) 09/02/2017  . COPD (chronic obstructive pulmonary disease) (HCC)   . CORONARY ARTERY DISEASE 11/23/2009  . 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 DEGENERATIVE JOINT DISEASE 11/23/2009  . DEPRESSION 11/23/2009  . FATIGUE 11/23/2009  . GAIT DISTURBANCE 12/10/2009  . HEMOPTYSIS UNSPECIFIED 05/07/2010  . High cholesterol   . HYPERTENSION 07/30/2009  . HYPOTHYROIDISM 07/30/2009  . Ischemic cardiomyopathy 09/02/2017  . 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 On home oxygen therapy    "  2-3L; 24/7" (09/10/2016)  . OSA on CPAP   . Pneumonia   . PTSD (post-traumatic stress disorder) 03/10/2012  . 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 RA (rheumatoid arthritis) (HCC) 06/11/2011   "qwhere" (09/10/2016)  . RESPIRATORY FAILURE, CHRONIC 07/31/2009  . Scleritis of both eyes 03/17/2014  . Thrombocytopenia (HCC)   . 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 Type II diabetes mellitus (HCC)     Review of Systems neg for any significant sore throat, dysphagia, itching, sneezing, nasal  congestion or excess/ purulent secretions, fever, chills, sweats, unintended wt loss, pleuritic or exertional cp, hempoptysis, orthopnea pnd or change in chronic leg swelling. Also denies presyncope, palpitations, heartburn, abdominal pain, nausea, vomiting, diarrhea or change in bowel or urinary habits, dysuria,hematuria, rash, arthralgias, visual complaints, headache, numbness weakness or ataxia.     Objective:   Physical Exam  Gen. Pleasant, well-nourished, in no distress, normal affect ENT - no pallor,icterus, no post nasal drip Neck: No JVD, no thyromegaly, no carotid bruits, no stigmata of nasal bleeding Lungs: no use of accessory muscles, no dullness to percussion, decreased on RT  without rales or rhonchi  Cardiovascular: Rhythm regular, heart sounds  normal, no murmurs or gallops, no peripheral edema Abdomen: soft and non-tender, no hepatosplenomegaly, BS normal. Musculoskeletal: No deformities, no cyanosis or clubbing Neuro:  alert, non focal       Assessment & Plan:

## 2020-11-14 NOTE — Assessment & Plan Note (Signed)
With extensive right-sided infiltrate, will treat him with Levaquin for 7 days -Assess CBC for leukocytosis -Bring him back for follow-up chest x-ray in 2 weeks

## 2020-11-14 NOTE — Telephone Encounter (Signed)
I called and spoke with pt and he is aware of the lab results.  He stated that he picked up the rx for the prednisone and that they only gave him 20 tablets and that would not be enough to follow the directions he was given for the prednisone from RA>    I called the pharmacy and the pharmacist stated that the rx that they received this morning was for only 20 tablets, take one tablet by mouth daily with breakfast.   I read the rx to the pharmacist and she stated that since they already dispensed the 20 tablets, that if we could send in a new rx for the remainder of the tablets, that she will run it through insurance as a dosage increase.  The rx has been sent.

## 2020-11-14 NOTE — Patient Instructions (Addendum)
CBC with diff + BMET today Bleeding may be due to low platelet count  Will treat as pneumonia Levaquin 500 mg daily x 7 days Prednisone 10 mg tabs Take 4 tabs  daily with food x 4 days, then 3 tabs daily x 4 days, then 2 tabs daily x 4 days, then 1 tab daily x4 days then go back to 5 mg  #40  Trial of Stiolto 2 puffs once daiily - this will take the place of spiriva & striverdi

## 2020-11-14 NOTE — Telephone Encounter (Signed)
Spoke with Larita Fife at [facility]. Pt's platelet count was 44,000. She said that she will be putting lab results in pt's chart.  Routing to Dr. Vassie Loll.

## 2020-11-14 NOTE — Assessment & Plan Note (Signed)
He is on Striverdi and Spiriva, will combine the 2 into Stiolto Since he is already on oral steroids.  In the future we can consider triple therapy

## 2020-11-14 NOTE — Assessment & Plan Note (Addendum)
On my detailed exam today, there is no evidence of nasal or upper airway bleeding.  Chest x-ray shows extensive infiltrate which could be pneumonia but could also be aspirated blood.  I am concerned that hemoptysis is related to his low platelet count, this was low at 37K on 4/7. We will repeat CBC today. We will empirically treat him with prednisone starting at 40 mg and lower over 2 weeks to his baseline dose of 5 mg Advised that he will need to go to the to emergency room if hypoxia worsens or more than half Of hemoptysis. Do not feel the need for bronchoscopy here, will wait for his platelet count improved

## 2020-11-24 DIAGNOSIS — G4733 Obstructive sleep apnea (adult) (pediatric): Secondary | ICD-10-CM | POA: Diagnosis not present

## 2020-11-24 DIAGNOSIS — J449 Chronic obstructive pulmonary disease, unspecified: Secondary | ICD-10-CM | POA: Diagnosis not present

## 2020-11-24 DIAGNOSIS — J8410 Pulmonary fibrosis, unspecified: Secondary | ICD-10-CM | POA: Diagnosis not present

## 2020-11-24 DIAGNOSIS — E860 Dehydration: Secondary | ICD-10-CM | POA: Diagnosis not present

## 2020-11-26 DIAGNOSIS — J449 Chronic obstructive pulmonary disease, unspecified: Secondary | ICD-10-CM | POA: Diagnosis not present

## 2020-11-26 DIAGNOSIS — G4733 Obstructive sleep apnea (adult) (pediatric): Secondary | ICD-10-CM | POA: Diagnosis not present

## 2020-11-28 ENCOUNTER — Ambulatory Visit: Payer: Medicare PPO | Admitting: Adult Health

## 2020-11-28 ENCOUNTER — Encounter: Payer: Self-pay | Admitting: *Deleted

## 2020-11-28 ENCOUNTER — Encounter: Payer: Self-pay | Admitting: Adult Health

## 2020-11-28 ENCOUNTER — Other Ambulatory Visit: Payer: Self-pay

## 2020-11-28 VITALS — BP 138/68 | HR 67 | Ht 72.0 in | Wt 212.2 lb

## 2020-11-28 DIAGNOSIS — Z9989 Dependence on other enabling machines and devices: Secondary | ICD-10-CM | POA: Diagnosis not present

## 2020-11-28 DIAGNOSIS — J441 Chronic obstructive pulmonary disease with (acute) exacerbation: Secondary | ICD-10-CM | POA: Diagnosis not present

## 2020-11-28 DIAGNOSIS — J189 Pneumonia, unspecified organism: Secondary | ICD-10-CM | POA: Diagnosis not present

## 2020-11-28 DIAGNOSIS — G4733 Obstructive sleep apnea (adult) (pediatric): Secondary | ICD-10-CM | POA: Diagnosis not present

## 2020-11-28 DIAGNOSIS — J9611 Chronic respiratory failure with hypoxia: Secondary | ICD-10-CM | POA: Diagnosis not present

## 2020-11-28 MED ORDER — STIOLTO RESPIMAT 2.5-2.5 MCG/ACT IN AERS: 2.0000 | INHALATION_SPRAY | Freq: Every day | RESPIRATORY_TRACT | 0 refills | Status: DC

## 2020-11-28 NOTE — Assessment & Plan Note (Signed)
Continue on oxygen 2 L 

## 2020-11-28 NOTE — Patient Instructions (Addendum)
Continue on Stiolto 2 puffs daily .  Mucinex DM Twice daily  As needed  Cough/congestion .  Continue on Oxygen 2l/m  Continue on CPAP At bedtime  With oxygen . Wear with Naps.  Activity as tolerated.  Follow up in 3-4 weeks with Chest xray and As needed   Please contact office for sooner follow up if symptoms do not improve or worsen or seek emergency care

## 2020-11-28 NOTE — Assessment & Plan Note (Addendum)
Recent exacerbation with associated right-sided pneumonia.  Now improving.  Patient is to continue on Stiolto. Check chest x-ray on return  plan  Patient Instructions  Continue on Stiolto 2 puffs daily .  Mucinex DM Twice daily  As needed  Cough/congestion .  Continue on Oxygen 2l/m  Continue on CPAP At bedtime  With oxygen . Wear with Naps.  Activity as tolerated.  Follow up in 3-4 weeks with Chest xray and As needed   Please contact office for sooner follow up if symptoms do not improve or worsen or seek emergency care

## 2020-11-28 NOTE — Assessment & Plan Note (Signed)
Continue on CPAP  

## 2020-11-28 NOTE — Progress Notes (Signed)
@Patient  ID: , male    DOB: 15-Dec-1940, 80 y.o.   MRN: [id number]  Chief Complaint  Patient presents with  . Follow-up    Referring provider: [id number], *  HPI: 80 year old male former smoker followed for very severe COPD, chronic respiratory failure on oxygen at 2 L.  Patient has obstructive sleep apnea with nocturnal CPAP.  He has followed with serial CT chest for lung nodules.  Medical history significant for rheumatoid arthritis not on maintenance medication.  He has diabetes, congestive heart failure, coronary artery disease status post RCA stent, chronic kidney disease.  A. fib with previous use of amiodarone which was stopped in August 2019.  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  Previous left common femoral to popliteal bypass in January 2022 for thrombosed left popliteal arterial aneurysm  pacemaker implantation December 2020 after syncopal episodes.  And followed by hematology on chronic steroids with baseline prednisone at 5 mg daily for ITP. Patient gets his medications through the January 2021 system as he is retired [place].  TEST/EVENTS :   11/28/2020 Follow up : COPD , O2 RF , Pneumonia  Patient returns for a 2-week follow-up.  Patient was seen last visit for COPD exacerbation and probable pneumonia.  Patient also had some hemoptysis.  Patient was treated with Levaquin and a prednisone taper.  Chest x-ray showed increased patchy opacity in the right lower lung zone and right upper lung. Patient says he is feeling much better.  Has decreased his prednisone is currently on 10 mg of prednisone and will resume daily 5 mg of prednisone.  He has finished all of his Levaquin.  Patient has decreased cough and congestion.  Hemoptysis has nearly totally resolved.  Patient says his energy level has been significantly improved and was actually able to clean his whole house and mow and weed eat yesterday.  Patient has no increased oxygen demands.  O2  saturations have been good on 2 L of oxygen.  He has good appetite says he has been eating much better the last several days.  He has no nausea vomiting or diarrhea. Patient does have a follow-up with hematology tomorrow for his ITP.  He was changed to 01/28/2021 last visit.  Patient says it is working very well and would like to remain on this.  No Known Allergies  Immunization History  Administered Date(s) Administered  . Fluad Quad(high Dose 65+) 04/26/2019, 06/08/2020  . Influenza Split 04/14/2011, 04/27/2013, 03/28/2014  . Influenza Whole 05/28/2009, 04/27/2010, 03/31/2012  . Influenza, High Dose Seasonal PF 04/10/2010, 04/11/2011, 04/19/2013, 03/21/2015, 04/05/2016, 05/16/2017, 03/28/2018, 04/08/2018  . Influenza,inj,Quad PF,6+ Mos 03/17/2014, 04/04/2016  . Influenza-Unspecified 04/30/2006, 04/28/2007, 04/28/2008, 05/29/2009, 04/10/2010, 05/26/2011, 05/07/2012, 05/28/2020  . PFIZER(Purple Top)SARS-COV-2 Vaccination 08/19/2019, 09/09/2019, 04/08/2020  . Pneumococcal Conjugate-13 09/20/2013, 03/29/2015  . Pneumococcal Polysaccharide-23 05/28/2008, 04/25/2010, 04/19/2013  . Pneumococcal-Unspecified 06/26/2006, 04/27/2013  . Td 11/23/2009  . Tdap 07/17/2011  . Zoster 04/28/2009, 11/08/2009    Past Medical History:  Diagnosis Date  . 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 Anxiety   . Aortic aneurysm (HCC) 09/02/2017  . BENIGN PROSTATIC HYPERTROPHY 11/23/2009  . Cardiomyopathy (HCC) 08/28/2016  . Chronic systolic CHF (congestive heart failure) (HCC) 09/02/2017  . COPD (chronic obstructive pulmonary disease) (HCC)   . CORONARY ARTERY DISEASE 11/23/2009  . 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 DEGENERATIVE JOINT DISEASE 11/23/2009  . DEPRESSION 11/23/2009  . FATIGUE 11/23/2009  . GAIT DISTURBANCE 12/10/2009  . HEMOPTYSIS  UNSPECIFIED 05/07/2010  . High cholesterol   . HYPERTENSION 07/30/2009  . HYPOTHYROIDISM 07/30/2009  . Ischemic cardiomyopathy 09/02/2017  . LUMBAR RADICULOPATHY, RIGHT  06/05/2010  . On home oxygen therapy    "2-3L; 24/7" (09/10/2016)  . OSA on CPAP   . Pneumonia   . PTSD (post-traumatic stress disorder) 03/10/2012  . 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 RA (rheumatoid arthritis) (HCC) 06/11/2011   "qwhere" (09/10/2016)  . RESPIRATORY FAILURE, CHRONIC 07/31/2009  . Scleritis of both eyes 03/17/2014  . Thrombocytopenia (HCC)   . 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 Type II diabetes mellitus (HCC)     Tobacco History: Social History   Tobacco Use  Smoking Status Former Smoker  . Packs/day: 2.50  . Years: 40.00  . Pack years: 100.00  . Types: Cigarettes, Pipe, Cigars  . Quit date: 07/28/1998  . Years since quitting: 22.3  Smokeless Tobacco Never Used   Counseling given: Not Answered   Outpatient Medications Prior to Visit  Medication Sig Dispense Refill  . albuterol (PROVENTIL) (2.5 MG/3ML) 0.083% nebulizer solution Take 3 mLs (2.5 mg total) by nebulization every 6 (six) hours as needed for wheezing or shortness of breath. 120 mL 12  . albuterol (VENTOLIN HFA) 108 (90 Base) MCG/ACT inhaler Inhale 2 puffs into the lungs every 6 (six) hours as needed for wheezing or shortness of breath. 8 g 6  . atorvastatin (LIPITOR) 40 MG tablet Take 1 tablet (40 mg total) by mouth daily. 90 tablet 3  . Carboxymethylcellul-Glycerin (LUBRICATING EYE DROPS OP) Place 1 drop into both eyes 4 (four) times daily as needed (dry eyes).     . Cholecalciferol (VITAMIN D3) 2000 units capsule Take 2,000 Units by mouth daily.     . empagliflozin (JARDIANCE) 10 MG TABS tablet Take 5 mg by mouth daily. Filled at [place].    [name] gabapentin (NEURONTIN) 100 MG capsule Take 1 capsule (100 mg total) by mouth 2 (two) times daily. 90 capsule 0  . insulin glargine (LANTUS) 100 UNIT/ML injection Inject 60 Units into the skin at bedtime.     [name] levothyroxine (SYNTHROID) 150 MCG tablet Take 150 mcg by mouth daily before breakfast.    . losartan (COZAAR) 50 MG tablet Take 1 tablet (50 mg total) by mouth daily. 90 tablet 3   . Menthol, Topical Analgesic, (BIOFREEZE EX) Apply 1 application topically 3 (three) times daily as needed (pain. (calf muscle)).    . metFORMIN (GLUCOPHAGE) 500 MG tablet Take 500-1,000 mg by mouth 2 (two) times daily with a meal. Take 1 tablet (500 mg) by mouth in the morning & take 2 tablets (1000 mg) by mouth in the evening.    . Multiple Vitamins-Minerals (MEGA MULTIVITAMIN FOR MEN PO) Take 1 tablet by mouth daily.    . Olodaterol HCl (STRIVERDI RESPIMAT) 2.5 MCG/ACT AERS Inhale 1 puff into the lungs daily. (Patient taking differently: Inhale 1 puff into the lungs every evening.)    . OXYGEN Inhale 2 L into the lungs continuous.    . predniSONE (DELTASONE) 5 MG tablet Take 1 tablet (5 mg total) by mouth daily with breakfast. 90 tablet 3  . Semaglutide (OZEMPIC, 1 MG/DOSE, [place]) Inject 1 mg into the skin every Friday.    . tamsulosin (FLOMAX) 0.4 MG CAPS capsule Take 1 capsule (0.4 mg total) by mouth at bedtime. 30 capsule 0  . tiotropium (SPIRIVA HANDIHALER) 18 MCG inhalation capsule Place 1 capsule (18 mcg total) into inhaler and inhale daily. 30 capsule 4  . Tiotropium Bromide-Olodaterol (STIOLTO  RESPIMAT) 2.5-2.5 MCG/ACT AERS Inhale 2 puffs into the lungs daily. 4 g 0  . zolpidem (AMBIEN) 5 MG tablet Take 1 tablet (5 mg total) by mouth at bedtime as needed for sleep. 30 tablet 0  . furosemide (LASIX) 40 MG tablet Take 1 tablet (40 mg total) by mouth daily. (Patient taking differently: Take 40 mg by mouth daily as needed (fluid retention.).) 90 tablet 3  . levofloxacin (LEVAQUIN) 500 MG tablet Take 1 tablet (500 mg total) by mouth daily. (Patient not taking: Reported on 11/28/2020) 7 tablet 0  . predniSONE (DELTASONE) 10 MG tablet Take 4 tabs daily with food x4 days, then 3 tabs daily x4 days, then 2 tabs daily x4 days, then 1 tab daily x4 days. Then go back to 5mg . Dispense #20 (Patient not taking: Reported on 11/28/2020) 20 tablet 0   No facility-administered medications prior to visit.      Review of Systems:   Constitutional:   No  weight loss, night sweats,  Fevers, chills,  +fatigue, or  lassitude.  HEENT:   No headaches,  Difficulty swallowing,  Tooth/dental problems, or  Sore throat,                No sneezing, itching, ear ache, nasal congestion, post nasal drip,   CV:  No chest pain,  Orthopnea, PND, swelling in lower extremities, anasarca, dizziness, palpitations, syncope.   GI  No heartburn, indigestion, abdominal pain, nausea, vomiting, diarrhea, change in bowel habits, loss of appetite, bloody stools.   Resp:    No chest wall deformity  Skin: no rash or lesions.  GU: no dysuria, change in color of urine, no urgency or frequency.  No flank pain, no hematuria   MS:  No joint pain or swelling.  No decreased range of motion.  No back pain.    Physical Exam  BP 138/68 (BP Location: Left Arm, Patient Position: Sitting, Cuff Size: Normal)   Pulse 67   Ht 6' (1.829 m)   Wt 212 lb 3.2 oz (96.3 kg)   SpO2 94%   BMI 28.78 kg/m   GEN: A/Ox3; pleasant , NAD, on O2   HEENT:  /AT,    NOSE-clear, THROAT-clear, no lesions, no postnasal drip or exudate noted.   NECK:  Supple w/ fair ROM; no JVD; normal carotid impulses w/o bruits; no thyromegaly or nodules palpated; no lymphadenopathy.    RESP  Clear  P & A; w/o, wheezes/ rales/ or rhonchi. no accessory muscle use, no dullness to percussion  CARD:  RRR, no m/r/g, tr  peripheral edema, pulses intact, no cyanosis or clubbing.  GI:   Soft & nt; nml bowel sounds; no organomegaly or masses detected.   Musco: Warm bil, no deformities or joint swelling noted.   Neuro: alert, no focal deficits noted.    Skin: Warm, no lesions or rashes Stasis dermatitis changes on lower extremities    Lab Results:  BMET  BNP No results found for: BNP  ProBNP    Component Value Date/Time   PROBNP 188 10/20/2016 1038   PROBNP 59.9 05/04/2011 0517    Imaging: DG Chest 2 View  Result Date: 11/14/2020 CLINICAL  DATA:  Hemoptysis. EXAM: CHEST - 2 VIEW COMPARISON:  Radiograph 08/25/2020.  CT 10/03/2020 FINDINGS: Chronic scarring in the right hemithorax with pleural thickening, pleural calcifications better appreciated on prior CT. There is increase in patchy opacity at the right lower lung zone and periphery of the right upper lung zone from prior exam. Underlying  emphysema and chronic bronchial thickening again seen. Streaky scarring in the left upper lobe on prior CT is partially obscured by overlying pacemaker battery pack. Pacemaker is unchanged in position. Stable heart size and mediastinal contours. Chronic hyperinflation. IMPRESSION: 1. Increase in patchy opacity at the right lower lung zone and periphery of the right upper lung zone from prior exam, may be acute infection or inflammation. 2. Background chronic scarring throughout the right hemithorax better characterized on prior CT. 3. Chronic hyperinflation and emphysema. Electronically Signed   By: Narda Rutherford M.D.   On: 11/14/2020 15:52      PFT Results Latest Ref Rng & Units 11/16/2015 12/30/2013  FVC-Pre L 2.96 3.01  FVC-Predicted Pre % 59 59  FVC-Post L 2.89 3.01  FVC-Predicted Post % 58 59  Pre FEV1/FVC % % 43 45  Post FEV1/FCV % % 46 49  FEV1-Pre L 1.28 1.36  FEV1-Predicted Pre % 35 36  FEV1-Post L 1.33 1.48  DLCO uncorrected ml/min/mmHg 9.88 10.13  DLCO UNC% % 26 26  DLCO corrected ml/min/mmHg 9.94 -  DLCO COR %Predicted % 26 -  DLVA Predicted % 40 39  TLC L - 6.31  TLC % Predicted % - 80  RV % Predicted % - 80    No results found for: NITRICOXIDE      Assessment & Plan:   COPD (chronic obstructive pulmonary disease) (HCC) Recent exacerbation with associated right-sided pneumonia.  Now improving.  Patient is to continue on Stiolto. Check chest x-ray on return  plan  Patient Instructions  Continue on Stiolto 2 puffs daily .  Mucinex DM Twice daily  As needed  Cough/congestion .  Continue on Oxygen 2l/m  Continue on  CPAP At bedtime  With oxygen . Wear with Naps.  Activity as tolerated.  Follow up in 3-4 weeks with Chest xray and As needed   Please contact office for sooner follow up if symptoms do not improve or worsen or seek emergency care       OSA on CPAP Continue on CPAP  Chronic respiratory failure with hypoxia (HCC) Continue on oxygen 2 L     Rubye Oaks, NP 11/28/2020

## 2020-11-29 ENCOUNTER — Inpatient Hospital Stay: Payer: Medicare PPO | Attending: Hematology and Oncology

## 2020-11-29 DIAGNOSIS — D693 Immune thrombocytopenic purpura: Secondary | ICD-10-CM | POA: Insufficient documentation

## 2020-11-29 LAB — CBC WITH DIFFERENTIAL (CANCER CENTER ONLY)
Abs Immature Granulocytes: 0.03 10*3/uL (ref 0.00–0.07)
Basophils Absolute: 0 10*3/uL (ref 0.0–0.1)
Basophils Relative: 0 %
Eosinophils Absolute: 0.2 10*3/uL (ref 0.0–0.5)
Eosinophils Relative: 2 %
HCT: 36.7 % — ABNORMAL LOW (ref 39.0–52.0)
Hemoglobin: 11.2 g/dL — ABNORMAL LOW (ref 13.0–17.0)
Immature Granulocytes: 0 %
Lymphocytes Relative: 22 %
Lymphs Abs: 1.9 10*3/uL (ref 0.7–4.0)
MCH: 24.8 pg — ABNORMAL LOW (ref 26.0–34.0)
MCHC: 30.5 g/dL (ref 30.0–36.0)
MCV: 81.4 fL (ref 80.0–100.0)
Monocytes Absolute: 0.8 10*3/uL (ref 0.1–1.0)
Monocytes Relative: 10 %
Neutro Abs: 5.7 10*3/uL (ref 1.7–7.7)
Neutrophils Relative %: 66 %
Platelet Count: 68 10*3/uL — ABNORMAL LOW (ref 150–400)
RBC: 4.51 MIL/uL (ref 4.22–5.81)
RDW: 15.4 % (ref 11.5–15.5)
WBC Count: 8.6 10*3/uL (ref 4.0–10.5)
nRBC: 0 % (ref 0.0–0.2)

## 2020-12-06 ENCOUNTER — Telehealth: Payer: Self-pay | Admitting: Adult Health

## 2020-12-06 ENCOUNTER — Telehealth: Payer: Self-pay | Admitting: *Deleted

## 2020-12-06 NOTE — Telephone Encounter (Signed)
Called and left a detailed message for patient per DPR for patient to return call regarding letter to send to the [place] about his inhaler.  [facility] and whether he wants [place] to fax it to them or if he wants [place] to mail it to him.  Will await his return call.

## 2020-12-06 NOTE — Telephone Encounter (Signed)
Called and spoke with patient who is asking to have the letter mailed to him that was made for him to take to the [place] for his inhaler. Verified address with patient. Letter has been printed, signed and put in mail to go to patient. Nothing further needed at this time.

## 2020-12-11 ENCOUNTER — Telehealth: Payer: Self-pay

## 2020-12-11 ENCOUNTER — Other Ambulatory Visit: Payer: Self-pay

## 2020-12-11 DIAGNOSIS — Z79899 Other long term (current) drug therapy: Secondary | ICD-10-CM | POA: Insufficient documentation

## 2020-12-11 DIAGNOSIS — M7989 Other specified soft tissue disorders: Secondary | ICD-10-CM

## 2020-12-11 DIAGNOSIS — D696 Thrombocytopenia, unspecified: Secondary | ICD-10-CM | POA: Insufficient documentation

## 2020-12-11 DIAGNOSIS — M1990 Unspecified osteoarthritis, unspecified site: Secondary | ICD-10-CM | POA: Insufficient documentation

## 2020-12-11 DIAGNOSIS — E669 Obesity, unspecified: Secondary | ICD-10-CM | POA: Insufficient documentation

## 2020-12-11 NOTE — Telephone Encounter (Signed)
Pt called with c/o bilateral leg swelling that comes and goes frequently and lower leg/ankle sores that have been non-healing for about a month. Pt states the sores have a yellow drainage coming from them. He denies pain. Per MD, pt is to f/u with APP with DVT studies. Pt is aware and verbalized understanding of this appt.

## 2020-12-13 ENCOUNTER — Other Ambulatory Visit: Payer: Self-pay

## 2020-12-13 ENCOUNTER — Ambulatory Visit: Payer: Medicare PPO | Admitting: Physician Assistant

## 2020-12-13 ENCOUNTER — Ambulatory Visit (HOSPITAL_COMMUNITY)
Admission: RE | Admit: 2020-12-13 | Discharge: 2020-12-13 | Disposition: A | Discharge status: Home / Self Care | Payer: Medicare PPO | Source: Ambulatory Visit | Attending: Vascular Surgery | Admitting: Vascular Surgery

## 2020-12-13 VITALS — BP 150/77 | HR 77 | Temp 97.8°F | Resp 20 | Ht 72.0 in | Wt 214.8 lb

## 2020-12-13 DIAGNOSIS — S81802A Unspecified open wound, left lower leg, initial encounter: Secondary | ICD-10-CM

## 2020-12-13 DIAGNOSIS — M7989 Other specified soft tissue disorders: Secondary | ICD-10-CM

## 2020-12-13 DIAGNOSIS — S81801A Unspecified open wound, right lower leg, initial encounter: Secondary | ICD-10-CM | POA: Diagnosis not present

## 2020-12-13 NOTE — Progress Notes (Signed)
[facility]           OF [place]  History and Physical   GLENWOOD REVOIR is a 80 y.o. 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  Of note he does have a history of an aortobiiliac bypass for AAA in 2004.  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 Chestine Spore.  He was seen by Dr. Chestine Spore on 10/30/2020 for follow up and at that time, pt was doing well from left leg bypass.  He was being followed for right leg 2.8cm popliteal aneurysm.  He had previously discussed a stent graft in the right leg but he had concerns for a number of reasons including previous aortobililiac bypass and he would have to come from his arm or antegrade in the right groin.  Unfortunately coming from his arm we do not have a delivery system long enough for Viabahn stent to the popliteal region from bracheal access.  Sticking his right groin antegrade would mean sticking a femoral aneurysm which could put him at high risk for complication.  In addition he has a high PT takeoff and so deploying the stent into normal artery below the aneurysm would have to cover the right posterior tibial artery.  The runoff in the anterior tibial peroneal looks very marginal and I am concerned about it supporting a covered stent.  I discussed all this with him today and I thought his best option would likely be a open bypass in the right leg as well.   pt was not interested in this at the time, which was understandable given all his comorbidities and still recovering from previous bypass.  He was scheduled to come back in 6 months for repeat studies.    Pt presents today with c/o BLE swelling that comes and goes frequently and lower leg/ankle sores have been non healing for about a month.  He does have some yellow drainage from them.   He did not have any pain.  He was scheduled for DVT studies and presents today for that visit.    He comes in today and states he has had these wounds for about a month.  He states that the one on the back of the left leg hurts when it dries out.  He has been putting triple abx ointment as well as some peroxide on it.  He states it was stinging as he was sitting there.  They do drain a yellow color.  He does elevate his legs at night (has adjustable bed) and the swelling is better in the morning.  He does not wear compression b/c he can't get them on.  He does not sit around a lot and is up and about.  He does not really get claudication in his legs and he denies any rest pain in his feet.  He states his leg has been swelling since his surgery in January.  He does have remote hx of DVT in the left leg.  He was brought in today for DVT study.    The pt is on a statin for cholesterol management.  The pt is not on a daily aspirin.   Other AC:  none The pt is on ARB for hypertension.   The pt is  diabetic.   Tobacco hx:  former   Past Medical History:  Diagnosis Date  . 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 Anxiety   . Aortic aneurysm (HCC) 09/02/2017  . BENIGN PROSTATIC HYPERTROPHY 11/23/2009  . Cardiomyopathy (HCC) 08/28/2016  . Chronic systolic CHF (congestive heart failure) (HCC) 09/02/2017  . COPD (chronic obstructive pulmonary disease) (HCC)   . CORONARY ARTERY DISEASE 11/23/2009  . 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 DEGENERATIVE JOINT DISEASE 11/23/2009  . DEPRESSION 11/23/2009  . FATIGUE 11/23/2009  . GAIT DISTURBANCE 12/10/2009  . HEMOPTYSIS UNSPECIFIED 05/07/2010  . High cholesterol   . HYPERTENSION 07/30/2009  . HYPOTHYROIDISM 07/30/2009  . Ischemic cardiomyopathy 09/02/2017  . 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 On home oxygen therapy    "2-3L; 24/7" (09/10/2016)  . OSA on CPAP   . Pneumonia   . PTSD (post-traumatic stress disorder) 03/10/2012  . PULMONARY FIBROSIS  06/18/2010  . RA (rheumatoid arthritis) (HCC) 06/11/2011   "qwhere" (09/10/2016)  . RESPIRATORY FAILURE, CHRONIC 07/31/2009  . Scleritis of both eyes 03/17/2014  . Thrombocytopenia (HCC)   . 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 Type II diabetes mellitus (HCC)     Past Surgical History:  Procedure Laterality Date  . 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 ABDOMINAL AORTOGRAM W/LOWER EXTREMITY N/A 08/16/2020   Procedure: ABDOMINAL AORTOGRAM W/LOWER EXTREMITY;  Surgeon: Cephus Shelling, MD;  Location: [facility] INVASIVE CV LAB;  Service: Cardiovascular;  Laterality: N/A;  . ABDOMINAL EXPLORATION SURGERY  02/2004   w/LOA/notes 12/10/2010; small bowel obstruction repair with adhesiolysis   . BACK SURGERY    . CARDIAC CATHETERIZATION     2 heart caths in the past.  One in 2000s showed one ulcerated plaque  Rx medically; Second at [facility] [name] 09/05/2016  . CATARACT EXTRACTION W/ INTRAOCULAR LENS  IMPLANT, BILATERAL Bilateral 2000s  . COLECTOMY     hx of remote ileum resection due to bleeding  . COLONOSCOPY WITH PROPOFOL N/A 01/22/2018   Procedure: COLONOSCOPY WITH PROPOFOL;  Surgeon: Iva Boop, MD;  Location: [facility] ENDOSCOPY;  Service: Endoscopy;  Laterality: N/A;  . CORONARY ANGIOPLASTY WITH STENT PLACEMENT  09/10/2016  . CORONARY STENT INTERVENTION N/A 09/10/2016   Procedure: Coronary Stent Intervention;  Surgeon: Kathleene Hazel, MD;  Location: [facility] INVASIVE CV LAB;  Service: Cardiovascular;  Laterality: N/A;  Distal RCA 4.0x16 Synergy  . ESOPHAGOGASTRODUODENOSCOPY (EGD) WITH PROPOFOL N/A 01/22/2018   Procedure: ESOPHAGOGASTRODUODENOSCOPY (EGD) WITH PROPOFOL;  Surgeon: Iva Boop, MD;  Location: [facility] ENDOSCOPY;  Service: Endoscopy;  Laterality: N/A;  . EYE SURGERY    . FALSE ANEURYSM REPAIR Left 08/22/2020   Procedure: EXCLUSION OF LEFT POPLITEAL ARTERY ANEURYSM;  Surgeon: Cephus Shelling, MD;  Location: [facility] OR;  Service: Vascular;  Laterality: Left;  . FEMORAL EMBOLOECTOMY Left  07/2000   with left leg ischemia; Dr. Hart Rochester, vascular  . FEMORAL-POPLITEAL BYPASS GRAFT Bilateral 08/22/2020   Procedure: LEFT FEMORAL-POSTERIOR TIBIAL ARTERY BYPASS GRAFT USING RIGHT GREATER NONREVERSED SAPHENOUS VEIN GRAFT;  Surgeon: Cephus Shelling, MD;  Location: [facility] OR;  Service: Vascular;  Laterality: Bilateral;  . GANGLION CYST EXCISION Right    "wrist"; Dr. Teressa Senter  . HOT HEMOSTASIS N/A 01/22/2018   Procedure: HOT HEMOSTASIS (ARGON PLASMA COAGULATION/BICAP);  Surgeon: Iva Boop, MD;  Location: [facility] ENDOSCOPY;  Service: Endoscopy;  Laterality: N/A;  . LOOP RECORDER INSERTION N/A 04/25/2019   Procedure: LOOP RECORDER INSERTION;  Surgeon: Duke Salvia, MD;  Location: [facility] INVASIVE  CV LAB;  Service: Cardiovascular;  Laterality: N/A;  . LOOP RECORDER REMOVAL N/A 07/01/2019   Procedure: LOOP RECORDER REMOVAL;  Surgeon: Duke Salvia, MD;  Location: [facility] INVASIVE CV LAB;  Service: Cardiovascular;  Laterality: N/A;  . LUMBAR LAMINECTOMY  1972   Dr. Fannie Knee  . PACEMAKER IMPLANT N/A 07/01/2019   Procedure: PACEMAKER IMPLANT;  Surgeon: Duke Salvia, MD;  Location: [facility] INVASIVE CV LAB;  Service: Cardiovascular;  Laterality: N/A;  . RIGHT/LEFT HEART CATH AND CORONARY ANGIOGRAPHY N/A 09/10/2016   Procedure: Right/Left Heart Cath and Coronary Angiography;  Surgeon: Kathleene Hazel, MD;  Location: [facility] INVASIVE CV LAB;  Service: Cardiovascular;  Laterality: N/A;  . TONSILLECTOMY      Social History   Socioeconomic History  . Marital status: Widowed    Spouse name: Not on file  . Number of children: Not on file  . Years of education: Not on file  . Highest education level: Not on file  Occupational History  . Occupation: disabled Cytogeneticist, Ex Neurosurgeon: RETIRED  Tobacco Use  . Smoking status: Former Smoker    Packs/day: 2.50    Years: 40.00    Pack years: 100.00    Types: Cigarettes, Pipe, Cigars    Quit date: 07/28/1998    Years since quitting: 22.3  . Smokeless  tobacco: Never Used  Vaping Use  . Vaping Use: Never used  Substance and Sexual Activity  . Alcohol use: No    Alcohol/week: 0.0 standard drinks  . Drug use: No  . Sexual activity: Not Currently  Other Topics Concern  . Not on file  Social History Narrative   Lives in the home with his wife   Sees [place] every 6 month for meds.   Denies asbestos exposure   Social Determinants of Corporate investment banker Strain: Not on file  Food Insecurity: Not on file  Transportation Needs: Not on file  Physical Activity: Not on file  Stress: Not on file  Social Connections: Not on file  Intimate Partner Violence: Not on file     Family History  Problem Relation Age of Onset  . Other Mother        gun shot    Current Outpatient Medications  Medication Sig Dispense Refill  . albuterol (PROVENTIL) (2.5 MG/3ML) 0.083% nebulizer solution Take 3 mLs (2.5 mg total) by nebulization every 6 (six) hours as needed for wheezing or shortness of breath. 120 mL 12  . albuterol (VENTOLIN HFA) 108 (90 Base) MCG/ACT inhaler Inhale 2 puffs into the lungs every 6 (six) hours as needed for wheezing or shortness of breath. 8 g 6  . atorvastatin (LIPITOR) 40 MG tablet Take 1 tablet (40 mg total) by mouth daily. 90 tablet 3  . Carboxymethylcellul-Glycerin (LUBRICATING EYE DROPS OP) Place 1 drop into both eyes 4 (four) times daily as needed (dry eyes).     . Cholecalciferol (VITAMIN D3) 2000 units capsule Take 2,000 Units by mouth daily.     . empagliflozin (JARDIANCE) 10 MG TABS tablet Take 5 mg by mouth daily. Filled at [place].    . furosemide (LASIX) 40 MG tablet Take 1 tablet (40 mg total) by mouth daily. (Patient taking differently: Take 40 mg by mouth daily as needed (fluid retention.).) 90 tablet 3  . gabapentin (NEURONTIN) 100 MG capsule Take 1 capsule (100 mg total) by mouth 2 (two) times daily. 90 capsule 0  . insulin glargine (LANTUS) 100 UNIT/ML injection Inject 60 Units  into the skin at bedtime.     [name]  levothyroxine (SYNTHROID) 150 MCG tablet Take 150 mcg by mouth daily before breakfast.    . losartan (COZAAR) 50 MG tablet Take 1 tablet (50 mg total) by mouth daily. 90 tablet 3  . Menthol, Topical Analgesic, (BIOFREEZE EX) Apply 1 application topically 3 (three) times daily as needed (pain. (calf muscle)).    . metFORMIN (GLUCOPHAGE) 500 MG tablet Take 500-1,000 mg by mouth 2 (two) times daily with a meal. Take 1 tablet (500 mg) by mouth in the morning & take 2 tablets (1000 mg) by mouth in the evening.    . Multiple Vitamins-Minerals (MEGA MULTIVITAMIN FOR MEN PO) Take 1 tablet by mouth daily.    . Olodaterol HCl (STRIVERDI RESPIMAT) 2.5 MCG/ACT AERS Inhale 1 puff into the lungs daily. (Patient taking differently: Inhale 1 puff into the lungs every evening.)    . OXYGEN Inhale 2 L into the lungs continuous.    . predniSONE (DELTASONE) 5 MG tablet Take 1 tablet (5 mg total) by mouth daily with breakfast. 90 tablet 3  . Semaglutide (OZEMPIC, 1 MG/DOSE, [place]) Inject 1 mg into the skin every Friday.    . tamsulosin (FLOMAX) 0.4 MG CAPS capsule Take 1 capsule (0.4 mg total) by mouth at bedtime. 30 capsule 0  . tiotropium (SPIRIVA HANDIHALER) 18 MCG inhalation capsule Place 1 capsule (18 mcg total) into inhaler and inhale daily. 30 capsule 4  . Tiotropium Bromide-Olodaterol (STIOLTO RESPIMAT) 2.5-2.5 MCG/ACT AERS Inhale 2 puffs into the lungs daily. 4 g 0  . Tiotropium Bromide-Olodaterol (STIOLTO RESPIMAT) 2.5-2.5 MCG/ACT AERS Inhale 2 puffs into the lungs daily. 2 each 0  . zolpidem (AMBIEN) 5 MG tablet Take 1 tablet (5 mg total) by mouth at bedtime as needed for sleep. 30 tablet 0   No current facility-administered medications for this visit.    No Known Allergies  REVIEW OF SYSTEMS:    denotes positive finding,  denotes negative finding Cardiac  Comments:  Chest pain or chest pressure:    Shortness of breath upon exertion:    Short of breath when lying flat:    Irregular heart rhythm:         Vascular    Pain in calf, thigh, or hip brought on by ambulation:    Pain in feet at night that wakes you up from your sleep:     Blood clot in your veins:    Leg swelling:  x       Pulmonary    Oxygen at home:    Productive cough:     Wheezing:         Neurologic    Sudden weakness in arms or legs:     Sudden numbness in arms or legs:     Sudden onset of difficulty speaking or slurred speech:    Temporary loss of vision in one eye:     Problems with dizziness:         Gastrointestinal    Blood in stool:     Vomited blood:         Genitourinary    Burning when urinating:     Blood in urine:        Psychiatric    Major depression:         Hematologic    Bleeding problems:    Problems with blood clotting too easily:        Skin    Rashes or ulcers:  Constitutional    Fever or chills:      PHYSICAL EXAMINATION:  Today's Vitals   12/13/20 0955  BP: (!) 150/77  Pulse: 77  Resp: 20  Temp: 97.8 F (36.6 C)  TempSrc: Temporal  SpO2: 98%  Weight: 214 lb 12.8 oz (97.4 kg)  Height: 6' (1.829 m)  PainSc: 4   PainLoc: Leg   Body mass index is 29.13 kg/m.   General:  WDWN in NAD; vital signs documented above Gait: Not observed HENT: WNL, normocephalic Pulmonary: normal non-labored breathing without wheezing Cardiac: regular HR; without carotid bruits Skin: without rashes Vascular Exam/Pulses:  Right Left  Femoral  2+ (normal) 2+ (normal)  DP Brisk monophasic  Faint monophasic doppler  PT Brisk monophasic Brisk monophasic  Peroneal Unable to obtain Brisk monophasic   Extremities: BLE swelling with Left > right  Right shin   Right posterior lower leg   Left shin    Left posterior lower leg    Musculoskeletal: no muscle wasting or atrophy  Neurologic: A&O X 3;  moving all extremities equally Psychiatric:  The pt has Normal affect.   Non-Invasive Vascular Imaging:   Venous duplex on 12/13/2020: Left popliteal artery aneurysm  is thrombosed.    Summary:  BILATERAL:  - No evidence of deep vein thrombosis seen in the lower extremities,  bilaterally.  - No evidence of superficial venous thrombosis in the lower extremities,  bilaterally.  -No evidence of popliteal cyst, bilaterally.   Arterial duplex 07/31/2020: Aorta:  +--------+-------+----------+----------+--------+--------+-----+      AP (cm)Trans (cm)PSV (cm/s)WaveformThrombusShape  +--------+-------+----------+----------+--------+--------+-----+  Proximal    2.70   72    biphasic         +--------+-------+----------+----------+--------+--------+-----+  Mid   2.20  2.30   62    biphasic         +--------+-------+----------+----------+--------+--------+-----+  Distal 2.40  2.20   92    biphasic         +--------+-------+----------+----------+--------+--------+-----+   +-----------+--------+-----+---------------+---------+---------------------  RIGHT   PSV cm/sRatioStenosis    Waveform Comments        +-----------+--------+-----+---------------+---------+---------------------  CIA Prox  177              biphasic             +-----------+--------+-----+---------------+---------+---------------------  CIA Distal 165              biphasic             +-----------+--------+-----+---------------+---------+---------------------  EIA Prox  157              triphasic             +-----------+--------+-----+---------------+---------+---------------------  EIA Mid  160              triphasic            +-----------+--------+-----+---------------+---------+---------------------  EIA Distal 178              triphasic            +-----------+--------+-----+---------------+---------+--------------------- CFA Prox  162               triphasic           +-----------+--------+-----+---------------+---------+---------------------  CFA Distal 119              biphasic turbulent due to ectasia  +-----------+--------+-----+---------------+---------+---------------------  DFA    119              triphasic            +-----------+--------+-----+---------------+---------+---------------------  SFA Prox  107              triphasic            +-----------+--------+-----+---------------+---------+---------------------  SFA Mid  139              triphasicplaque         +-----------+--------+-----+---------------+---------+---------------------  SFA Distal 180      30-49% stenosistriphasicplaque         +-----------+--------+-----+---------------+---------+---------------------  POP Prox  48              biphasic turbulent from aneurysm   +-----------+--------+-----+---------------+---------+---------------------  POP Distal 27              triphasic            +-----------+--------+-----+---------------+---------+---------------------  TP Trunk  144                              +-----------+--------+-----+---------------+---------+---------------------  ATA Distal 71              triphasic            +-----------+--------+-----+---------------+---------+---------------------  PTA Prox  59              triphasic            +-----------+--------+-----+---------------+---------+---------------------  PTA Mid  32              triphasic            +-----------+--------+-----+---------------+---------+---------------------  PTA Distal 52              triphasic             +-----------+--------+-----+---------------+---------+---------------------  PERO Distal89              triphasic            +-----------+--------+-----+---------------+---------+---------------------   The right common femoral artery is ectatic, measuring 1.8 x 1.8 cm. The popliteal artery is aneurysmal, measuring 2.8 x 2.8 cm at its widest  dimension.     +-----------------+-----------------------+--------------+  Right Diameter(s)Anterior/Posterial (cm)Transverse(cm)  +-----------------+-----------------------+--------------+  CFA       1.80          1.80       +-----------------+-----------------------+--------------+   +---------------+-------+-----------+--------+--------+-----+--------+  Right PoplitealAP (cm)Transv (cm)WaveformStenosisShapeComments  +---------------+-------+-----------+--------+--------+-----+--------+  Proximal    2.80  2.80                     +---------------+-------+-----------+--------+--------+-----+--------+  Mid      2.40  2.40                     +---------------+-------+-----------+--------+--------+-----+--------+   +--------------+-------+-----------+--------+--------+-----+--------+  Left PoplitealAP (cm)Transv (cm)WaveformStenosisShapeComments  +--------------+-------+-----------+--------+--------+-----+--------+  Proximal   2.40  2.50                     +--------------+-------+-----------+--------+--------+-----+--------+   +-----------+--------+-----+--------+-------------------+------------------  LEFT    PSV cm/sRatioStenosisWaveform      Comments       +-----------+--------+-----+--------+-------------------+------------------  CIA Prox  99          biphasic                  +-----------+--------+-----+--------+-------------------+------------------  CIA Distal 137          biphasic                 +-----------+--------+-----+--------+-------------------+------------------  EIA Prox  136          triphasic                +-----------+--------+-----+--------+-------------------+------------------  EIA Mid  130          triphasic                +-----------+--------+-----+--------+-------------------+------------------  EIA Distal 127          triphasic                +-----------+--------+-----+--------+-------------------+------------------   CFA Prox  141          triphasic     turbulent      +-----------+--------+-----+--------+-------------------+------------------  CFA Distal 68          triphasic                +-----------+--------+-----+--------+-------------------+------------------   DFA    242          biphasic                 +-----------+--------+-----+--------+-------------------+------------------  SFA Prox  84          triphasic                +-----------+--------+-----+--------+-------------------+------------------  SFA Mid  105          triphasic                +-----------+--------+-----+--------+-------------------+------------------  SFA Distal 42          biphasic      ectatic, turbulent    +-----------+--------+-----+--------+-------------------+------------------  POP Prox  28                               +-----------+--------+-----+--------+-------------------+------------------  POP Mid         occluded                       +-----------+--------+-----+--------+-------------------+------------------ +  POP Distal 0       occluded                     +-----------+--------+-----+--------+-------------------+------------------ TP Trunk  10          monophasic      reconstituted/dampened  +-----------+--------+-----+--------+-------------------+------------------  ATA Prox  15                               +-----------+--------+-----+--------+-------------------+------------------  ATA Mid  0       occluded                     +-----------+--------+-----+--------+-------------------+------------------   ATA Distal 0       occluded                     +-----------+--------+-----+--------+-------------------+------------------  PTA Prox  11          dampened monophasic           +-----------+--------+-----+--------+-------------------+------------------  PTA Mid  52          monophasic                +-----------+--------+-----+--------+-------------------+------------------  PTA Distal 13          monophasic                +-----------+--------+-----+--------+-------------------+------------------  PERO Distal48          monophasic     difficult to visualize  +-----------+--------+-----+--------+-------------------+------------------   +-----------------+-----------------------+---------------+  Left Diameter(s):Anterior/Posterior (cm)Transverse (cm)  +-----------------+-----------------------+---------------+  CFA       1.60  1.60        +-----------------+-----------------------+---------------+  SFA       1.30          1.40        +-----------------+-----------------------+---------------+   Summary:   Right: 30-49% stenosis noted in the superficial femoral artery. The right  common femoral artery is ectatic, measuring 1.8 cm. The right popliteal  artery is aneurysmal, measuring 2.8 cm. Atherosclerosis noted in the  femoral and popliteal artery.   Left: Total occlusion noted in the popliteal artery. Total occlusion noted  in the anterior tibial artery. Thrombosed popliteal artery aneurysm  measuring 2.5 cm. Dampened monophasic flow in the TP trunk, posterior tibial and visualized portion of the  peroneal artery.   Aorta-Iliac: Visualized portions of the aorta-bi-iliac bypass graft appear  patent without significant stenosis or aneurysm.    [name] is a 80 y.o. 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  Of note he does have a history of an aortobiiliac bypass for AAA in 2004.  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 Chestine Spore.  He presents today with bilateral leg swelling and non healing wounds.   -pt with non healing wounds present on both legs for about a month and are not really improving.  He was brought in today to check a DVT study bc of the swelling in his legs and this was negative for DVT in both legs. -given his wounds and hx of PAD with hx of LLE bypass with right GSV in January, I'm not comfortable placing him in unna boots today.  I discussed with pt that most likely, this is a mixed venous and arterial issue.  Due to availability, will have him come back tomorrow for BLE venous reflux study and LLE arterial duplex with ABI and he will see Dr. Chestine Spore on Tuesday to make further recommendations going forward.  In the meantime, he was instructed on wet to dry dressing and gentle ace wrap from toes to knees to help with swelling.  He knows to put this on in the morning and take off at  night.   -he will call sooner if he has any issues prior to his visit with Dr. Chestine Spore on Tuesday.     Doreatha Massed, [facility] 12/13/2020 8:09 AM  Clinic MD:  Edilia Bo

## 2020-12-14 ENCOUNTER — Ambulatory Visit (HOSPITAL_COMMUNITY)
Admission: RE | Admit: 2020-12-14 | Discharge: 2020-12-14 | Disposition: A | Discharge status: Home / Self Care | Payer: Medicare PPO | Source: Ambulatory Visit | Attending: Vascular Surgery | Admitting: Vascular Surgery

## 2020-12-14 ENCOUNTER — Ambulatory Visit (INDEPENDENT_AMBULATORY_CARE_PROVIDER_SITE_OTHER)
Admission: RE | Admit: 2020-12-14 | Discharge: 2020-12-14 | Disposition: A | Discharge status: Home / Self Care | Payer: Medicare PPO | Source: Ambulatory Visit | Attending: Vascular Surgery | Admitting: Vascular Surgery

## 2020-12-14 DIAGNOSIS — M7989 Other specified soft tissue disorders: Secondary | ICD-10-CM

## 2020-12-14 DIAGNOSIS — S81802A Unspecified open wound, left lower leg, initial encounter: Secondary | ICD-10-CM | POA: Insufficient documentation

## 2020-12-14 DIAGNOSIS — J449 Chronic obstructive pulmonary disease, unspecified: Secondary | ICD-10-CM | POA: Diagnosis not present

## 2020-12-18 ENCOUNTER — Encounter: Payer: Self-pay | Admitting: Vascular Surgery

## 2020-12-18 ENCOUNTER — Ambulatory Visit: Payer: Medicare PPO | Admitting: Vascular Surgery

## 2020-12-18 ENCOUNTER — Other Ambulatory Visit: Payer: Self-pay

## 2020-12-18 DIAGNOSIS — L97909 Non-pressure chronic ulcer of unspecified part of unspecified lower leg with unspecified severity: Secondary | ICD-10-CM | POA: Diagnosis not present

## 2020-12-18 DIAGNOSIS — I87319 Chronic venous hypertension (idiopathic) with ulcer of unspecified lower extremity: Secondary | ICD-10-CM | POA: Diagnosis not present

## 2020-12-18 NOTE — Progress Notes (Signed)
Patient name: [name] MRN: [id number] DOB: Nov 25, 1940 Sex: male  REASON FOR VISIT:.  Evaluate bilateral lower extremity wounds  HPI: [name] is a 80 y.o. male with multiple medical comorbidities who presents for further follow-up and to discuss bilateral lower extremity wounds.  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  Of note he does have a history of an aortobiiliac bypass for AAA in 2004.  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  He saw the PA last week with new bilateral lower extremity wounds.  Noninvasive studies were ordered to evaluate his left leg bypass and ABIs as well as venous reflux study of his lower extremites.  States not having any pain in his legs at this time.  Still wants to defer repair of right leg popliteal aneurysm.  Past Medical History:  Diagnosis Date  . 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 Anxiety   . Aortic aneurysm (HCC) 09/02/2017  . BENIGN PROSTATIC HYPERTROPHY 11/23/2009  . Cardiomyopathy (HCC) 08/28/2016  . Chronic systolic CHF (congestive heart failure) (HCC) 09/02/2017  . COPD (chronic obstructive pulmonary disease) (HCC)   . CORONARY ARTERY DISEASE 11/23/2009  . 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 DEGENERATIVE JOINT DISEASE 11/23/2009  . DEPRESSION 11/23/2009  . FATIGUE 11/23/2009  . GAIT DISTURBANCE 12/10/2009  . HEMOPTYSIS UNSPECIFIED 05/07/2010  . High cholesterol   . HYPERTENSION 07/30/2009  . HYPOTHYROIDISM 07/30/2009  . Ischemic cardiomyopathy 09/02/2017  . 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 On home oxygen therapy    "2-3L; 24/7" (09/10/2016)  . OSA on CPAP   . Pneumonia   . PTSD (post-traumatic stress disorder) 03/10/2012  . 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 RA (rheumatoid arthritis) (HCC) 06/11/2011   "qwhere"  (09/10/2016)  . RESPIRATORY FAILURE, CHRONIC 07/31/2009  . Scleritis of both eyes 03/17/2014  . Thrombocytopenia (HCC)   . 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 Type II diabetes mellitus (HCC)     Past Surgical History:  Procedure Laterality Date  . 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 ABDOMINAL AORTOGRAM W/LOWER EXTREMITY N/A 08/16/2020   Procedure: ABDOMINAL AORTOGRAM W/LOWER EXTREMITY;  Surgeon: Cephus Shelling, MD;  Location: [facility] INVASIVE CV LAB;  Service: Cardiovascular;  Laterality: N/A;  . ABDOMINAL EXPLORATION SURGERY  02/2004   w/LOA/notes 12/10/2010; small bowel obstruction repair with adhesiolysis   . BACK SURGERY    . CARDIAC CATHETERIZATION     2 heart caths in the past.  One in 2000s showed one ulcerated plaque  Rx medically; Second at [facility] [name] 09/05/2016  . CATARACT EXTRACTION W/ INTRAOCULAR LENS  IMPLANT, BILATERAL Bilateral 2000s  . COLECTOMY     hx of remote ileum resection due to bleeding  . COLONOSCOPY WITH PROPOFOL N/A 01/22/2018   Procedure: COLONOSCOPY WITH PROPOFOL;  Surgeon: Iva Boop, MD;  Location: [facility] ENDOSCOPY;  Service: Endoscopy;  Laterality: N/A;  . CORONARY ANGIOPLASTY WITH STENT PLACEMENT  09/10/2016  . CORONARY STENT INTERVENTION N/A 09/10/2016   Procedure: Coronary Stent Intervention;  Surgeon: Kathleene Hazel, MD;  Location: [facility] INVASIVE CV LAB;  Service: Cardiovascular;  Laterality: N/A;  Distal RCA 4.0x16 Synergy  . ESOPHAGOGASTRODUODENOSCOPY (EGD) WITH PROPOFOL N/A 01/22/2018   Procedure: ESOPHAGOGASTRODUODENOSCOPY (EGD) WITH PROPOFOL;  Surgeon: Iva Boop, MD;  Location: [facility] ENDOSCOPY;  Service: Endoscopy;  Laterality: N/A;  . EYE SURGERY    . FALSE ANEURYSM REPAIR Left 08/22/2020   Procedure: EXCLUSION OF LEFT POPLITEAL ARTERY ANEURYSM;  Surgeon: Cephus Shelling, MD;  Location: [facility] OR;  Service: Vascular;  Laterality: Left;  . FEMORAL EMBOLOECTOMY Left 07/2000   with left leg ischemia; Dr. Hart Rochester, vascular  .  FEMORAL-POPLITEAL BYPASS GRAFT Bilateral 08/22/2020   Procedure: LEFT FEMORAL-POSTERIOR TIBIAL ARTERY BYPASS GRAFT USING RIGHT GREATER NONREVERSED SAPHENOUS VEIN GRAFT;  Surgeon: Cephus Shelling, MD;  Location: [facility] OR;  Service: Vascular;  Laterality: Bilateral;  . GANGLION CYST EXCISION Right    "wrist"; Dr. Teressa Senter  . HOT HEMOSTASIS N/A 01/22/2018   Procedure: HOT HEMOSTASIS (ARGON PLASMA COAGULATION/BICAP);  Surgeon: Iva Boop, MD;  Location: [facility] ENDOSCOPY;  Service: Endoscopy;  Laterality: N/A;  . LOOP RECORDER INSERTION N/A 04/25/2019   Procedure: LOOP RECORDER INSERTION;  Surgeon: Duke Salvia, MD;  Location: [facility] INVASIVE CV LAB;  Service: Cardiovascular;  Laterality: N/A;  . LOOP RECORDER REMOVAL N/A 07/01/2019   Procedure: LOOP RECORDER REMOVAL;  Surgeon: Duke Salvia, MD;  Location: [facility] INVASIVE CV LAB;  Service: Cardiovascular;  Laterality: N/A;  . LUMBAR LAMINECTOMY  1972   Dr. Fannie Knee  . PACEMAKER IMPLANT N/A 07/01/2019   Procedure: PACEMAKER IMPLANT;  Surgeon: Duke Salvia, MD;  Location: [facility] INVASIVE CV LAB;  Service: Cardiovascular;  Laterality: N/A;  . RIGHT/LEFT HEART CATH AND CORONARY ANGIOGRAPHY N/A 09/10/2016   Procedure: Right/Left Heart Cath and Coronary Angiography;  Surgeon: Kathleene Hazel, MD;  Location: [facility] INVASIVE CV LAB;  Service: Cardiovascular;  Laterality: N/A;  . TONSILLECTOMY      Family History  Problem Relation Age of Onset  . Other Mother        gun shot    SOCIAL HISTORY: Social History   Tobacco Use  . Smoking status: Former Smoker    Packs/day: 2.50    Years: 40.00    Pack years: 100.00    Types: Cigarettes, Pipe, Cigars    Quit date: 07/28/1998    Years since quitting: 22.4  . Smokeless tobacco: Never Used  Substance Use Topics  . Alcohol use: No    Alcohol/week: 0.0 standard drinks    No Known Allergies  Current Outpatient Medications  Medication Sig Dispense Refill  . albuterol (PROVENTIL) (2.5 MG/3ML) 0.083% nebulizer  solution Take 3 mLs (2.5 mg total) by nebulization every 6 (six) hours as needed for wheezing or shortness of breath. 120 mL 12  . albuterol (VENTOLIN HFA) 108 (90 Base) MCG/ACT inhaler Inhale 2 puffs into the lungs every 6 (six) hours as needed for wheezing or shortness of breath. 8 g 6  . atorvastatin (LIPITOR) 40 MG tablet Take 1 tablet (40 mg total) by mouth daily. 90 tablet 3  . Carboxymethylcellul-Glycerin (LUBRICATING EYE DROPS OP) Place 1 drop into both eyes 4 (four) times daily as needed (dry eyes).     . Cholecalciferol (VITAMIN D3) 2000 units capsule Take 2,000 Units by mouth daily.     . empagliflozin (JARDIANCE) 10 MG TABS tablet Take 5 mg by mouth daily. Filled at [place].    [name] gabapentin (NEURONTIN) 100 MG capsule Take 1 capsule (100 mg total) by mouth 2 (two) times daily. 90 capsule 0  . insulin glargine (LANTUS) 100 UNIT/ML injection Inject 60 Units into the skin at bedtime.     [name] levothyroxine (SYNTHROID) 150 MCG tablet Take 150 mcg by mouth daily before breakfast.    .  losartan (COZAAR) 50 MG tablet Take 1 tablet (50 mg total) by mouth daily. 90 tablet 3  . Menthol, Topical Analgesic, (BIOFREEZE EX) Apply 1 application topically 3 (three) times daily as needed (pain. (calf muscle)).    . metFORMIN (GLUCOPHAGE) 500 MG tablet Take 500-1,000 mg by mouth 2 (two) times daily with a meal. Take 1 tablet (500 mg) by mouth in the morning & take 2 tablets (1000 mg) by mouth in the evening.    . Multiple Vitamins-Minerals (MEGA MULTIVITAMIN FOR MEN PO) Take 1 tablet by mouth daily.    . Olodaterol HCl (STRIVERDI RESPIMAT) 2.5 MCG/ACT AERS Inhale 1 puff into the lungs daily. (Patient taking differently: Inhale 1 puff into the lungs every evening.)    . OXYGEN Inhale 2 L into the lungs continuous.    . predniSONE (DELTASONE) 5 MG tablet Take 1 tablet (5 mg total) by mouth daily with breakfast. 90 tablet 3  . Semaglutide (OZEMPIC, 1 MG/DOSE, Benedict) Inject 1 mg into the skin every Friday.    .  tamsulosin (FLOMAX) 0.4 MG CAPS capsule Take 1 capsule (0.4 mg total) by mouth at bedtime. 30 capsule 0  . tiotropium (SPIRIVA HANDIHALER) 18 MCG inhalation capsule Place 1 capsule (18 mcg total) into inhaler and inhale daily. 30 capsule 4  . Tiotropium Bromide-Olodaterol (STIOLTO RESPIMAT) 2.5-2.5 MCG/ACT AERS Inhale 2 puffs into the lungs daily. 4 g 0  . Tiotropium Bromide-Olodaterol (STIOLTO RESPIMAT) 2.5-2.5 MCG/ACT AERS Inhale 2 puffs into the lungs daily. 2 each 0  . zolpidem (AMBIEN) 5 MG tablet Take 1 tablet (5 mg total) by mouth at bedtime as needed for sleep. 30 tablet 0  . furosemide (LASIX) 40 MG tablet Take 1 tablet (40 mg total) by mouth daily. (Patient taking differently: Take 40 mg by mouth daily as needed (fluid retention.).) 90 tablet 3   No current facility-administered medications for this visit.    REVIEW OF SYSTEMS:  [X]  denotes positive finding, [ ]  denotes negative finding Cardiac  Comments:  Chest pain or chest pressure:    Shortness of breath upon exertion:    Short of breath when lying flat:    Irregular heart rhythm:        Vascular    Pain in calf, thigh, or hip brought on by ambulation:    Pain in feet at night that wakes you up from your sleep:     Blood clot in your veins:    Leg swelling:         Pulmonary    Oxygen at home:    Productive cough:     Wheezing:         Neurologic    Sudden weakness in arms or legs:     Sudden numbness in arms or legs:     Sudden onset of difficulty speaking or slurred speech:    Temporary loss of vision in one eye:     Problems with dizziness:         Gastrointestinal    Blood in stool:     Vomited blood:         Genitourinary    Burning when urinating:     Blood in urine:        Psychiatric    Major depression:         Hematologic    Bleeding problems:    Problems with blood clotting too easily:        Skin    Rashes or ulcers:  Constitutional    Fever or chills:      PHYSICAL  EXAM: Vitals:   12/18/20 0951  BP: (!) 151/71  Pulse: 72  Resp: 20  Temp: (!) 97.4 F (36.3 C)  SpO2: 91%  Weight: 220 lb (99.8 kg)  Height: 6' (1.829 m)    GENERAL: The patient is a well-nourished male, in no acute distress. The vital signs are documented above. CARDIAC: There is a regular rate and rhythm.  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  He presents today to evaluate new ulcerations on the bilateral lower extremities.  Fortunately the right leg ulcerations have healed.  The left leg ulcerations pictured above are suggestive of venous disease and likely venous ulcers.  His left leg bypass is widely patent on duplex  and he has an easily palpable PT pulse in the left foot at the ankle.  We placed his left leg in Unna boot today.  We will refer for either home health for Unna boot changes versus the wound clinic.  If he cannot get his Unna boot changed in the next week he will need to get back to our clinic for [employer] change.  I will see him in a month for wound check.  He still wants to defer repair of right popliteal aneurysm at this time.   Cephus Shelling, MD [facility] Office: [phone]

## 2020-12-24 DIAGNOSIS — G4733 Obstructive sleep apnea (adult) (pediatric): Secondary | ICD-10-CM | POA: Diagnosis not present

## 2020-12-24 DIAGNOSIS — J449 Chronic obstructive pulmonary disease, unspecified: Secondary | ICD-10-CM | POA: Diagnosis not present

## 2020-12-24 DIAGNOSIS — E860 Dehydration: Secondary | ICD-10-CM | POA: Diagnosis not present

## 2020-12-24 DIAGNOSIS — J8410 Pulmonary fibrosis, unspecified: Secondary | ICD-10-CM | POA: Diagnosis not present

## 2020-12-26 ENCOUNTER — Other Ambulatory Visit: Payer: Self-pay

## 2020-12-26 ENCOUNTER — Ambulatory Visit (INDEPENDENT_AMBULATORY_CARE_PROVIDER_SITE_OTHER): Payer: Medicare PPO | Admitting: Physician Assistant

## 2020-12-26 ENCOUNTER — Encounter: Payer: Self-pay | Admitting: Physician Assistant

## 2020-12-26 DIAGNOSIS — S81802A Unspecified open wound, left lower leg, initial encounter: Secondary | ICD-10-CM | POA: Diagnosis not present

## 2020-12-26 NOTE — Progress Notes (Addendum)
Patient and his daughter Lamar Laundry) were present in office today for Unilateral Unna boot change (LLE). Today the wound located on the front of his leg looks better still not completely healed. He reports no pains but is still having drainage from this site. The wound on the posterial LLE is still causing him pain and is having a signifgant amount of drainage. Today when the unna boot was placed, I added 68M KerraMax Super Absorbent Pad along the posterior wound & ABD Pad to the wound located on the front of LLE to prevent drainage through the unna boot dressing. Patient tolerated Unna boot wrapping well & advised he will need to return to our office for the next 3 weeks for Unna boot changes. On that 3rd week he will be seen for a Wound check O/V with Dr. Chestine Spore per his OV note on 12/18/20. Patient and his daughter both voiced their understanding.   Supervising MD: Macon Large Kristofor Michalowski A.,CMA

## 2020-12-28 ENCOUNTER — Ambulatory Visit (INDEPENDENT_AMBULATORY_CARE_PROVIDER_SITE_OTHER): Payer: Medicare PPO

## 2020-12-28 DIAGNOSIS — I480 Paroxysmal atrial fibrillation: Secondary | ICD-10-CM

## 2020-12-28 LAB — CUP PACEART REMOTE DEVICE CHECK
Battery Remaining Longevity: 157 mo
Battery Voltage: 3.05 V
Brady Statistic AP VP Percent: 15.06 %
Brady Statistic AP VS Percent: 3.67 %
Brady Statistic AS VP Percent: 0.11 %
Brady Statistic AS VS Percent: 81.16 %
Brady Statistic RA Percent Paced: 18.82 %
Brady Statistic RV Percent Paced: 15.17 %
Date Time Interrogation Session: 20220602213612
Implantable Lead Implant Date: 20201204
Implantable Lead Implant Date: 20201204
Implantable Lead Location: 753859
Implantable Lead Location: 753860
Implantable Lead Model: 5076
Implantable Lead Model: 5076
Implantable Pulse Generator Implant Date: 20201204
Lead Channel Impedance Value: 323 Ohm
Lead Channel Impedance Value: 342 Ohm
Lead Channel Impedance Value: 380 Ohm
Lead Channel Impedance Value: 418 Ohm
Lead Channel Pacing Threshold Amplitude: 0.5 V
Lead Channel Pacing Threshold Amplitude: 0.5 V
Lead Channel Pacing Threshold Pulse Width: 0.4 ms
Lead Channel Pacing Threshold Pulse Width: 0.4 ms
Lead Channel Sensing Intrinsic Amplitude: 2.5 mV
Lead Channel Sensing Intrinsic Amplitude: 2.5 mV
Lead Channel Sensing Intrinsic Amplitude: 9.75 mV
Lead Channel Sensing Intrinsic Amplitude: 9.75 mV
Lead Channel Setting Pacing Amplitude: 1.5 V
Lead Channel Setting Pacing Amplitude: 2.5 V
Lead Channel Setting Pacing Pulse Width: 0.4 ms
Lead Channel Setting Sensing Sensitivity: 2 mV

## 2021-01-02 ENCOUNTER — Ambulatory Visit (INDEPENDENT_AMBULATORY_CARE_PROVIDER_SITE_OTHER): Payer: Medicare PPO | Admitting: Physician Assistant

## 2021-01-02 ENCOUNTER — Other Ambulatory Visit: Payer: Self-pay

## 2021-01-02 DIAGNOSIS — L97229 Non-pressure chronic ulcer of left calf with unspecified severity: Secondary | ICD-10-CM | POA: Diagnosis not present

## 2021-01-02 DIAGNOSIS — I83022 Varicose veins of left lower extremity with ulcer of calf: Secondary | ICD-10-CM | POA: Diagnosis not present

## 2021-01-02 NOTE — Progress Notes (Signed)
Patient was present in office today for Unilateral Unna boot change (LLE). Today the wound located on the front of his leg looks superficial but still not completely healed. He reports no pains but is still having drainage from this site. The wound on the posterial LLE is no longer causing him pain and is having a signifgant amount of bloody drainage. Today when the unna boot was placed, I added ABD Pads to the wounds located on the front of LLE & posterior LLE to prevent drainage through the unna boot dressing. Patient tolerated Unna boot wrapping well & advised he will need to return to our office for the next 2 weeks for Unna boot changes. On 01/15/2021 he will be seen for a Wound check O/V with Dr. Chestine Spore per his OV note on 12/18/20. Patient and his daughter both voiced their understanding.         Supervising MD: Carmela Rima Azad Calame A.,CMA

## 2021-01-08 ENCOUNTER — Other Ambulatory Visit: Payer: Self-pay

## 2021-01-08 ENCOUNTER — Encounter: Payer: Self-pay | Admitting: Vascular Surgery

## 2021-01-08 ENCOUNTER — Ambulatory Visit: Payer: Medicare PPO | Admitting: Vascular Surgery

## 2021-01-08 VITALS — BP 167/79 | HR 62 | Temp 98.1°F | Resp 18 | Ht 72.0 in | Wt 220.0 lb

## 2021-01-08 DIAGNOSIS — L97909 Non-pressure chronic ulcer of unspecified part of unspecified lower leg with unspecified severity: Secondary | ICD-10-CM | POA: Diagnosis not present

## 2021-01-08 DIAGNOSIS — I87319 Chronic venous hypertension (idiopathic) with ulcer of unspecified lower extremity: Secondary | ICD-10-CM

## 2021-01-08 NOTE — Progress Notes (Signed)
Patient name: [name] MRN: [id number] DOB: [birth year] Sex: male  [name], wound check left leg  HPI: [name] is a 80 y.o. male with multiple medical comorbidities who presents for 1 month wound check of left lower extremity venous ulcers.  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  Of note he does have a history of an aortobiiliac bypass for AAA in 2004.  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  He was sent to see me again from the [facility] clinic with bilateral lower extremity wounds.  Turns out the right wounds had healed.  On the left we did duplex imaging that showed a widely patent bypass but appeared to have significant venous insufficiency in the left leg with open ulcerations and we put him in Unna boots.  One of the  two wounds has now completely healed.  Past Medical History:  Diagnosis Date   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   ablated   Anxiety    Aortic aneurysm (HCC) 09/02/2017   BENIGN PROSTATIC HYPERTROPHY 11/23/2009   Cardiomyopathy (HCC) 08/28/2016   Chronic systolic CHF (congestive heart failure) (HCC) 09/02/2017   COPD (chronic obstructive pulmonary disease) (HCC)    CORONARY ARTERY DISEASE 11/23/2009   DECREASED HEARING, 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   DEGENERATIVE JOINT DISEASE 11/23/2009   DEPRESSION 11/23/2009   FATIGUE 11/23/2009   GAIT DISTURBANCE 12/10/2009   HEMOPTYSIS UNSPECIFIED 05/07/2010   High cholesterol    HYPERTENSION 07/30/2009   HYPOTHYROIDISM 07/30/2009   Ischemic cardiomyopathy 09/02/2017   LUMBAR RADICULOPATHY, 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   On home oxygen therapy    "2-3L; 24/7" (09/10/2016)   OSA on CPAP    Pneumonia    PTSD (post-traumatic stress disorder) 03/10/2012   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   RA (rheumatoid arthritis) (HCC)  06/11/2011   "qwhere" (09/10/2016)   RESPIRATORY FAILURE, CHRONIC 07/31/2009   Scleritis of both eyes 03/17/2014   Thrombocytopenia (HCC)    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   Type II diabetes mellitus (HCC)     Past Surgical History:  Procedure Laterality Date   ABDOMINAL AORTIC ANEURYSM REPAIR  07/2002   [name] 12/10/2010   ABDOMINAL AORTOGRAM W/LOWER EXTREMITY N/A 08/16/2020   Procedure: ABDOMINAL AORTOGRAM W/LOWER EXTREMITY;  Surgeon: Cephus Shelling, MD;  Location: [facility];  Service: Cardiovascular;  Laterality: N/A;   ABDOMINAL EXPLORATION SURGERY  02/2004   w/LOA/notes 12/10/2010; small bowel obstruction repair with adhesiolysis    BACK SURGERY     CARDIAC CATHETERIZATION     2 heart caths in the past.  One in 2000s showed one ulcerated plaque  Rx medically; Second at [facility] /notes 09/05/2016   CATARACT EXTRACTION W/ INTRAOCULAR LENS  IMPLANT, BILATERAL Bilateral 2000s   COLECTOMY     hx of remote ileum resection due to bleeding   COLONOSCOPY WITH PROPOFOL N/A 01/22/2018   Procedure: COLONOSCOPY WITH PROPOFOL;  Surgeon: Iva Boop, MD;  Location: [facility];  Service: Endoscopy;  Laterality: N/A;   CORONARY ANGIOPLASTY WITH STENT PLACEMENT  09/10/2016   CORONARY STENT INTERVENTION N/A 09/10/2016   Procedure: Coronary Stent Intervention;  Surgeon: Kathleene Hazel, MD;  Location: [facility];  Service: Cardiovascular;  Laterality: N/A;  Distal RCA 4.0x16 Synergy   ESOPHAGOGASTRODUODENOSCOPY (EGD) WITH PROPOFOL N/A 01/22/2018  Procedure: ESOPHAGOGASTRODUODENOSCOPY (EGD) WITH PROPOFOL;  Surgeon: Iva Boop, MD;  Location: [facility];  Service: Endoscopy;  Laterality: N/A;   EYE SURGERY     FALSE ANEURYSM REPAIR Left 08/22/2020   Procedure: EXCLUSION OF LEFT POPLITEAL ARTERY ANEURYSM;  Surgeon: Cephus Shelling, MD;  Location: [facility] OR;  Service: Vascular;  Laterality: Left;   FEMORAL EMBOLOECTOMY Left 07/2000   with left leg ischemia; Dr. Hart Rochester, vascular    FEMORAL-POPLITEAL BYPASS GRAFT Bilateral 08/22/2020   Procedure: LEFT FEMORAL-POSTERIOR TIBIAL ARTERY BYPASS GRAFT USING RIGHT GREATER NONREVERSED SAPHENOUS VEIN GRAFT;  Surgeon: Cephus Shelling, MD;  Location: [facility] OR;  Service: Vascular;  Laterality: Bilateral;   GANGLION CYST EXCISION Right    "wrist"; Dr. Teressa Senter   HOT HEMOSTASIS N/A 01/22/2018   Procedure: HOT HEMOSTASIS (ARGON PLASMA COAGULATION/BICAP);  Surgeon: Iva Boop, MD;  Location: [facility];  Service: Endoscopy;  Laterality: N/A;   LOOP RECORDER INSERTION N/A 04/25/2019   Procedure: LOOP RECORDER INSERTION;  Surgeon: Duke Salvia, MD;  Location: [facility]VAS[facility] CV LAB;  Service: Cardiovascular;  Laterality: N/A;   LOOP RECORDER REMOVAL N/A 07/01/2019   Procedure: LOOP RECORDER REMOVAL;  Surgeon: Duke Salvia, MD;  Location: [facility] INVASIVE CV LAB;  Service: Cardiovascular;  Laterality: N/A;   LUMBAR LAMINECTOMY  1972   Dr. Fannie Knee   PACEMAKER IMPLANT N/A 07/01/2019   Procedure: PACEMAKER IMPLANT;  Surgeon: Duke Salvia, MD;  Location: [facility] INVASIVE CV LAB;  Service: Cardiovascular;  Laterality: N/A;   RIGHT/LEFT HEART CATH AND CORONARY ANGIOGRAPHY N/A 09/10/2016   Procedure: Right/Left Heart Cath and Coronary Angiography;  Surgeon: Kathleene Hazel, MD;  Location: [facility] INVASIVE CV LAB;  Service: Cardiovascular;  Laterality: N/A;   TONSILLECTOMY      Family History  Problem Relation Age of Onset   Other Mother        gun shot    SOCIAL HISTORY: Social History   Tobacco Use   Smoking status: Former    Packs/day: 2.50    Years: 40.00    Pack years: 100.00    Types: Cigarettes, Pipe, Cigars    Quit date: 07/28/1998    Years since quitting: 22.4   Smokeless tobacco: Never  Substance Use Topics   Alcohol use: No    Alcohol/week: 0.0 standard drinks    No Known Allergies  Current Outpatient Medications  Medication Sig Dispense Refill   albuterol (PROVENTIL) (2.5 MG/3ML) 0.083% nebulizer solution Take 3 mLs (2.5  mg total) by nebulization every 6 (six) hours as needed for wheezing or shortness of breath. 120 mL 12   albuterol (VENTOLIN HFA) 108 (90 Base) MCG/ACT inhaler Inhale 2 puffs into the lungs every 6 (six) hours as needed for wheezing or shortness of breath. 8 g 6   atorvastatin (LIPITOR) 40 MG tablet Take 1 tablet (40 mg total) by mouth daily. 90 tablet 3   Carboxymethylcellul-Glycerin (LUBRICATING EYE DROPS OP) Place 1 drop into both eyes 4 (four) times daily as needed (dry eyes).      Cholecalciferol (VITAMIN D3) 2000 units capsule Take 2,000 Units by mouth daily.      empagliflozin (JARDIANCE) 10 MG TABS tablet Take 5 mg by mouth daily. Filled at [facility].     gabapentin (NEURONTIN) 100 MG capsule Take 1 capsule (100 mg total) by mouth 2 (two) times daily. 90 capsule 0   insulin glargine (LANTUS) 100 UNIT/ML injection Inject 60 Units into the skin at bedtime.      levothyroxine (SYNTHROID) 150  MCG tablet Take 150 mcg by mouth daily before breakfast.     losartan (COZAAR) 50 MG tablet Take 1 tablet (50 mg total) by mouth daily. 90 tablet 3   Menthol, Topical Analgesic, (BIOFREEZE EX) Apply 1 application topically 3 (three) times daily as needed (pain. (calf muscle)).     metFORMIN (GLUCOPHAGE) 500 MG tablet Take 500-1,000 mg by mouth 2 (two) times daily with a meal. Take 1 tablet (500 mg) by mouth in the morning & take 2 tablets (1000 mg) by mouth in the evening.     Multiple Vitamins-Minerals (MEGA MULTIVITAMIN FOR MEN PO) Take 1 tablet by mouth daily.     Olodaterol HCl (STRIVERDI RESPIMAT) 2.5 MCG/ACT AERS Inhale 1 puff into the lungs daily. (Patient taking differently: Inhale 1 puff into the lungs every evening.)     OXYGEN Inhale 2 L into the lungs continuous.     predniSONE (DELTASONE) 5 MG tablet Take 1 tablet (5 mg total) by mouth daily with breakfast. 90 tablet 3   Semaglutide (OZEMPIC, 1 MG/DOSE, [place]) Inject 1 mg into the skin every Friday.     tamsulosin (FLOMAX) 0.4 MG CAPS capsule Take 1  capsule (0.4 mg total) by mouth at bedtime. 30 capsule 0   tiotropium (SPIRIVA HANDIHALER) 18 MCG inhalation capsule Place 1 capsule (18 mcg total) into inhaler and inhale daily. 30 capsule 4   Tiotropium Bromide-Olodaterol (STIOLTO RESPIMAT) 2.5-2.5 MCG/ACT AERS Inhale 2 puffs into the lungs daily. 4 g 0   Tiotropium Bromide-Olodaterol (STIOLTO RESPIMAT) 2.5-2.5 MCG/ACT AERS Inhale 2 puffs into the lungs daily. 2 each 0   zolpidem (AMBIEN) 5 MG tablet Take 1 tablet (5 mg total) by mouth at bedtime as needed for sleep. 30 tablet 0   furosemide (LASIX) 40 MG tablet Take 1 tablet (40 mg total) by mouth daily. (Patient taking differently: Take 40 mg by mouth daily as needed (fluid retention.).) 90 tablet 3   No current facility-administered medications for this visit.    REVIEW OF SYSTEMS:  [X]  denotes positive finding, [ ]  denotes negative finding Cardiac  Comments:  Chest pain or chest pressure:    Shortness of breath upon exertion:    Short of breath when lying flat:    Irregular heart rhythm:        Vascular    Pain in calf, thigh, or hip brought on by ambulation:    Pain in feet at night that wakes you up from your sleep:     Blood clot in your veins:    Leg swelling:         Pulmonary    Oxygen at home:    Productive cough:     Wheezing:         Neurologic    Sudden weakness in arms or legs:     Sudden numbness in arms or legs:     Sudden onset of difficulty speaking or slurred speech:    Temporary loss of vision in one eye:     Problems with dizziness:         Gastrointestinal    Blood in stool:     Vomited blood:         Genitourinary    Burning when urinating:     Blood in urine:        Psychiatric    Major depression:         Hematologic    Bleeding problems:    Problems with blood clotting too easily:  Skin    Rashes or ulcers:        Constitutional    Fever or chills:      PHYSICAL EXAM: Vitals:   01/08/21 1036  BP: (!) 167/79  Pulse: 62   Resp: 18  Temp: 98.1 F (36.7 C)  TempSrc: Temporal  SpO2: 96%  Weight: 220 lb (99.8 kg)  Height: 6' (1.829 m)    GENERAL: The patient is a well-nourished male, in no acute distress. The vital signs are documented above. CARDIAC: There is a regular rate and rhythm.  VASCULAR:  Right DP palpable Left PT palpable Left leg wound pictured below - one of two completely healed after starting Unna Boots Right leg wounds previously healed         DATA:   Left leg bypass widely patent on arterial duplex with ABI 1.19 triphasic on the right and 0.96 biphasic on the left  Lower extremity venous reflux study shows significant left leg reflux in the popliteal vein as well as the GSV and anterior accessory saphenous vein    Assessment/Plan:  80 year old male status post left common femoral to PT bypass on 08/22/2020 requiring harvest of right leg great saphenous vein for 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  We have now been following him for new ulcerations in the left lower extremity that appear to be venous ulcerations.  He does have significant venous insufficiency on duplex.  We have placed him in Unna boots and he has been getting weekly Unna boot changes.  One of his two wounds in the left leg have now completely healed and making good progress.  Still has a palpable PT pulse with a widely patent bypass for treatment of his arterial disease and previously thrombosed popliteal aneurysm.  I will have him continue when for weekly Unna boot changes and I will see him in 1 month.   Cephus Shelling, MD [facility] Office: [phone]

## 2021-01-10 ENCOUNTER — Ambulatory Visit (INDEPENDENT_AMBULATORY_CARE_PROVIDER_SITE_OTHER): Payer: Medicare PPO

## 2021-01-10 ENCOUNTER — Ambulatory Visit (INDEPENDENT_AMBULATORY_CARE_PROVIDER_SITE_OTHER): Payer: Medicare PPO | Admitting: Adult Health

## 2021-01-10 ENCOUNTER — Other Ambulatory Visit: Payer: Self-pay

## 2021-01-10 ENCOUNTER — Encounter: Payer: Self-pay | Admitting: Adult Health

## 2021-01-10 VITALS — BP 120/58 | HR 61 | Temp 98.2°F | Ht 74.0 in | Wt 213.2 lb

## 2021-01-10 DIAGNOSIS — G4733 Obstructive sleep apnea (adult) (pediatric): Secondary | ICD-10-CM

## 2021-01-10 DIAGNOSIS — I5022 Chronic systolic (congestive) heart failure: Secondary | ICD-10-CM

## 2021-01-10 DIAGNOSIS — J439 Emphysema, unspecified: Secondary | ICD-10-CM | POA: Diagnosis not present

## 2021-01-10 DIAGNOSIS — J189 Pneumonia, unspecified organism: Secondary | ICD-10-CM

## 2021-01-10 DIAGNOSIS — J449 Chronic obstructive pulmonary disease, unspecified: Secondary | ICD-10-CM

## 2021-01-10 DIAGNOSIS — J9611 Chronic respiratory failure with hypoxia: Secondary | ICD-10-CM | POA: Diagnosis not present

## 2021-01-10 DIAGNOSIS — Z9989 Dependence on other enabling machines and devices: Secondary | ICD-10-CM

## 2021-01-10 NOTE — Assessment & Plan Note (Signed)
Appears euvolemic on exam. 

## 2021-01-10 NOTE — Assessment & Plan Note (Signed)
Recent COPD exacerbation now resolved and back to baseline.  Patient is continue on current regimen.  Seems to be doing well on Stiolto.  Plan  Patient Instructions  Continue on Stiolto 2 puffs daily .  Mucinex DM Twice daily  As needed  Cough/congestion .  Continue on Oxygen 2l/m  Continue on CPAP At bedtime  With oxygen . Wear with Naps.  Activity as tolerated.  Chest xray today .  Follow up in 3-4 Months  with Dr. Vassie Loll or Laurel Smeltz NP and As needed   Please contact office for sooner follow up if symptoms do not improve or worsen or seek emergency care

## 2021-01-10 NOTE — Assessment & Plan Note (Signed)
compensated on present regimen.  Continue on O2 to maintain O2 saturations greater than 88 to 90%.  No increased oxygen demands.

## 2021-01-10 NOTE — Assessment & Plan Note (Signed)
Probable recent pneumonia with associated COPD exacerbation with increased markings consistent with infectious process on the right lung.  Patient is clinically improved check chest x-ray today.

## 2021-01-10 NOTE — Patient Instructions (Addendum)
Continue on Stiolto 2 puffs daily .  Mucinex DM Twice daily  As needed  Cough/congestion .  Continue on Oxygen 2l/m  Continue on CPAP At bedtime  With oxygen . Wear with Naps.  Activity as tolerated.  Chest xray today .  Follow up in 3-4 Months  with Dr. Vassie Loll or Danajah Birdsell NP and As needed   Please contact office for sooner follow up if symptoms do not improve or worsen or seek emergency care

## 2021-01-10 NOTE — Assessment & Plan Note (Signed)
Excellent control and compliance on CPAP continue on current settings

## 2021-01-10 NOTE — Progress Notes (Signed)
@Patient  ID: [name], male    DOB: June 29, 1941, 80 y.o.   MRN: [id number]  Chief Complaint  Patient presents with   Follow-up     Referring provider: Myrlene Broker, *  HPI: 80 year old male former smoker followed for very severe COPD, chronic respiratory failure on oxygen at 2 L, obstructive sleep apnea with nocturnal CPAP and oxygen.  Lung nodules followed on serial CT chest. Medical history significant for rheumatoid arthritis not on maintenance medications.  Diabetes, congestive heart failure, coronary disease status post RCA stent, chronic kidney disease, A. fib with previous use of amiodarone stopped in August 2019.  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 PFT 10/2015 FEV1 was 36%, ratio 46, FVC 58%, DLCO 26%     CT chest 12/2016 -masslike consolidation in LUL   PET scan 03/20/17 >+metabolic act in LUL consolidation    CT chest 08/2017 >> 2.4 x 3.5 cm triangular subpleural opacity in the anterior left upper lobe > has resolved the platelike scarring bilateral nodular infiltrates stable,   CT chest 09/2020 severe emphysema, extensive pleural calcification.  Previous nodule in the  right lower lobe has significantly decreased in size measuring 0.8 x 0.8 cm previously at 2.3 cm.  A stable left upper lobe cavitary nodule measuring 4 mm.  And a stable irregular nodule in the left lower lobe measuring 1.5 cm, and a left lower lobe nodule measuring 1.5 cm  01/10/2021 Follow up: COPD , OSA, O2 RF , Pneumonia  Patient returns for a 6-week follow-up.  Patient has underlying severe COPD and oxygen dependent respiratory failure.  Last visit he was recovering from a COPD exacerbation and pneumonia.  Patient had acute respiratory symptoms hemoptysis.  Chest x-ray showed increased right lower lobe and right upper lobe opacity.  He was placed on Levaquin and a prednisone taper.  Clinically patient is feeling much better.  Says he is returned back to baseline.  He is doing all his normal activities.  Is had no further hemoptysis.  Patient lives alone as he is widowed.  He does all the housework shopping driving and some light yard work. He says overall he is feeling better.  He does get fatigued and winded with heavy activities.  But just takes this time and rest without any difficulty.  He denies any increased cough or congestion.  Patient is on nocturnal CPAP.  Says he never misses a night.  Says he wears it each night for about 8 to 9 hours.  CPAP download shows excellent compliance with 100% usage.  Daily average usage at 8.5 hours.  Patient is on auto CPAP 10 to 20 cm H2O.  AHI 4.9.  Patient is following with vascular  surgeon and wound clinic.  He has some poorly healing wound from previous bypass.  Patient says it is doing much better.  He is currently got an Astronomer on the left lower extremity.  Followed by cardiology for congestive heart failure and A. fib.  He says he has been doing well.  No increased leg swelling.  Actually leg edema has been much improved.    No Known Allergies  Immunization History  Administered Date(s) Administered   Fluad Quad(high Dose 65+) 04/26/2019,  06/08/2020   Influenza Split 04/14/2011, 04/27/2013, 03/28/2014   Influenza Whole 05/28/2009, 04/27/2010, 03/31/2012   Influenza, High Dose Seasonal PF 04/10/2010, 04/11/2011, 04/19/2013, 03/21/2015, 04/05/2016, 05/16/2017, 03/28/2018, 04/08/2018   Influenza,inj,Quad PF,6+ Mos 03/17/2014, 04/04/2016   Influenza-Unspecified 04/30/2006, 04/28/2007, 04/28/2008, 05/29/2009, 04/10/2010, 05/26/2011, 05/07/2012, 05/28/2020   PFIZER(Purple Top)SARS-COV-2 Vaccination 08/19/2019, 09/09/2019, 04/08/2020   Pneumococcal Conjugate-13 09/20/2013, 03/29/2015   Pneumococcal Polysaccharide-23 05/28/2008, 04/25/2010, 04/19/2013   Pneumococcal-Unspecified 06/26/2006, 04/27/2013   Td 11/23/2009   Tdap 07/17/2011   Zoster, Live 04/28/2009, 11/08/2009    Past Medical History:  Diagnosis Date   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   ablated   Anxiety    Aortic aneurysm (HCC) 09/02/2017   BENIGN PROSTATIC HYPERTROPHY 11/23/2009   Cardiomyopathy (HCC) 08/28/2016   Chronic systolic CHF (congestive heart failure) (HCC) 09/02/2017   COPD (chronic obstructive pulmonary disease) (HCC)    CORONARY ARTERY DISEASE 11/23/2009   DECREASED HEARING, 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   DEGENERATIVE JOINT DISEASE 11/23/2009   DEPRESSION 11/23/2009   FATIGUE 11/23/2009   GAIT DISTURBANCE 12/10/2009   HEMOPTYSIS UNSPECIFIED 05/07/2010   High cholesterol    HYPERTENSION 07/30/2009   HYPOTHYROIDISM 07/30/2009   Ischemic cardiomyopathy 09/02/2017   LUMBAR RADICULOPATHY, 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   On home oxygen therapy    "2-3L; 24/7" (09/10/2016)   OSA on CPAP    Pneumonia    PTSD (post-traumatic stress disorder) 03/10/2012   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   RA (rheumatoid arthritis) (HCC) 06/11/2011   "qwhere" (09/10/2016)   RESPIRATORY FAILURE, CHRONIC 07/31/2009   Scleritis of both eyes 03/17/2014   Thrombocytopenia (HCC)    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   Type II diabetes mellitus (HCC)     Tobacco History: Social History   Tobacco Use  Smoking Status Former    Packs/day: 2.50   Years: 40.00   Pack years: 100.00   Types: Cigarettes, Pipe, Cigars   Quit date: 07/28/1998   Years since quitting: 22.4  Smokeless Tobacco Never   Counseling given: Not Answered   Outpatient Medications Prior to Visit  Medication Sig Dispense Refill   albuterol (PROVENTIL) (2.5 MG/3ML) 0.083% nebulizer solution Take 3 mLs (2.5 mg total) by nebulization every 6 (six) hours as needed for wheezing or shortness of breath. 120 mL 12   albuterol (VENTOLIN HFA) 108 (90 Base) MCG/ACT inhaler Inhale 2 puffs into the lungs every 6 (six) hours as needed for wheezing or shortness of breath. 8 g 6   atorvastatin (LIPITOR) 40 MG tablet Take 1 tablet (40 mg total) by mouth daily. 90 tablet 3   Carboxymethylcellul-Glycerin (LUBRICATING EYE DROPS OP) Place 1 drop into both eyes 4 (four) times daily as needed (dry eyes).      Cholecalciferol (VITAMIN D3) 2000 units capsule Take 2,000 Units by mouth daily.      empagliflozin (JARDIANCE) 10 MG TABS tablet Take 5 mg by mouth daily. Filled at [facility].     gabapentin (NEURONTIN) 100 MG capsule Take 1 capsule (100 mg total) by mouth 2 (two) times daily.  90 capsule 0   insulin glargine (LANTUS) 100 UNIT/ML injection Inject 60 Units into the skin at bedtime.      levothyroxine (SYNTHROID) 150 MCG tablet Take 150 mcg by mouth daily before breakfast.     losartan (COZAAR) 50 MG tablet Take 1 tablet (50 mg total) by mouth daily. 90 tablet 3   Menthol, Topical Analgesic, (BIOFREEZE EX) Apply 1 application topically 3 (three) times daily as needed (pain. (calf muscle)).     metFORMIN (GLUCOPHAGE) 500 MG tablet Take 500-1,000 mg by mouth 2 (two) times daily with a meal. Take 1 tablet (500 mg) by mouth in the morning & take 2 tablets (1000 mg) by mouth in the evening.     Multiple Vitamins-Minerals (MEGA MULTIVITAMIN FOR MEN PO) Take 1 tablet by mouth daily.     OXYGEN Inhale 2 L into the lungs continuous.     predniSONE (DELTASONE) 5 MG tablet Take 1 tablet  (5 mg total) by mouth daily with breakfast. 90 tablet 3   Semaglutide (OZEMPIC, 1 MG/DOSE, [place]) Inject 1 mg into the skin every Friday.     tamsulosin (FLOMAX) 0.4 MG CAPS capsule Take 1 capsule (0.4 mg total) by mouth at bedtime. 30 capsule 0   Tiotropium Bromide-Olodaterol (STIOLTO RESPIMAT) 2.5-2.5 MCG/ACT AERS Inhale 2 puffs into the lungs daily. 4 g 0   Tiotropium Bromide-Olodaterol (STIOLTO RESPIMAT) 2.5-2.5 MCG/ACT AERS Inhale 2 puffs into the lungs daily. 2 each 0   zolpidem (AMBIEN) 5 MG tablet Take 1 tablet (5 mg total) by mouth at bedtime as needed for sleep. 30 tablet 0   Olodaterol HCl (STRIVERDI RESPIMAT) 2.5 MCG/ACT AERS Inhale 1 puff into the lungs daily. (Patient taking differently: Inhale 1 puff into the lungs every evening.)     tiotropium (SPIRIVA HANDIHALER) 18 MCG inhalation capsule Place 1 capsule (18 mcg total) into inhaler and inhale daily. 30 capsule 4   furosemide (LASIX) 40 MG tablet Take 1 tablet (40 mg total) by mouth daily. (Patient taking differently: Take 40 mg by mouth daily as needed (fluid retention.).) 90 tablet 3   No facility-administered medications prior to visit.     Review of Systems:   Constitutional:   No  weight loss, night sweats,  Fevers, chills,  +fatigue, or  lassitude.  HEENT:   No headaches,  Difficulty swallowing,  Tooth/dental problems, or  Sore throat,                No sneezing, itching, ear ache, nasal congestion, post nasal drip,   CV:  No chest pain,  Orthopnea, PND, +swelling in lower extremities, anasarca, dizziness, palpitations, syncope.   GI  No heartburn, indigestion, abdominal pain, nausea, vomiting, diarrhea, change in bowel habits, loss of appetite, bloody stools.   Resp: .  No excess mucus, no productive cough,  No non-productive cough,  No coughing up of blood.  No change in color of mucus.  No wheezing.  No chest wall deformity  Skin: no rash or lesions.  GU: no dysuria, change in color of urine, no urgency or  frequency.  No flank pain, no hematuria   MS:  No joint pain or swelling.  No decreased range of motion.  No back pain.    Physical Exam  BP (!) 120/58 (BP Location: Left Arm, Patient Position: Sitting, Cuff Size: Normal)   Pulse 61   Temp 98.2 F (36.8 C) (Temporal)   Ht  (1.88 m)   Wt 213 lb 3.2 oz (96.7 kg)  SpO2 97%   BMI 27.37 kg/m   GEN: A/Ox3; pleasant , NAD, well nourished , elderly on oxygen   HEENT:  [place]/AT,  , NOSE-clear, THROAT-clear, no lesions, no postnasal drip or exudate noted.   NECK:  Supple w/ fair ROM; no JVD; normal carotid impulses w/o bruits; no thyromegaly or nodules palpated; no lymphadenopathy.    RESP  Clear  P & A; w/o, wheezes/ rales/ or rhonchi. no accessory muscle use, no dullness to percussion  CARD:  RRR, no m/r/g, tr  peripheral edema, pulses intact, no cyanosis or clubbing. Stasis dermatitis changes on the right.  Left lower extremity Unna boot  GI:   Soft & nt; nml bowel sounds; no organomegaly or masses detected.   Musco: Warm bil, no deformities or joint swelling noted.   Neuro: alert, no focal deficits noted.    Skin: Warm, no lesions or rashes    Lab Results:  CBC    Component Value Date/Time   WBC 8.6 11/29/2020 0945   WBC 8.7 11/14/2020 0945   RBC 4.51 11/29/2020 0945   HGB 11.2 (L) 11/29/2020 0945   HGB 12.0 (L) 06/28/2019 1453   HCT 36.7 (L) 11/29/2020 0945   HCT 38.4 06/28/2019 1453   PLT 68 (L) 11/29/2020 0945   PLT 44 (LL) 06/28/2019 1453   MCV 81.4 11/29/2020 0945   MCV 89 06/28/2019 1453   MCH 24.8 (L) 11/29/2020 0945   MCHC 30.5 11/29/2020 0945   RDW 15.4 11/29/2020 0945   RDW 13.8 06/28/2019 1453   LYMPHSABS 1.9 11/29/2020 0945   LYMPHSABS 1.2 12/23/2016 1053   MONOABS 0.8 11/29/2020 0945   EOSABS 0.2 11/29/2020 0945   EOSABS 0.3 12/23/2016 1053   BASOSABS 0.0 11/29/2020 0945   BASOSABS 0.0 12/23/2016 1053    BMET    Component Value Date/Time   NA 141 11/14/2020 0945   NA 136 02/22/2020  1029   K 4.7 11/14/2020 0945   CL 102 11/14/2020 0945   CO2 29 11/14/2020 0945   GLUCOSE 187 (H) 11/14/2020 0945   BUN 22 11/14/2020 0945   BUN 28 (H) 02/22/2020 1029   CREATININE 1.31 11/14/2020 0945   CREATININE 1.17 04/13/2020 0915   CALCIUM 9.5 11/14/2020 0945   GFRNONAA >60 08/26/2020 0314   GFRNONAA >60 07/15/2018 1059   GFRAA 58 (L) 02/22/2020 1029   GFRAA >60 07/15/2018 1059    BNP No results found for: BNP  ProBNP    Component Value Date/Time   PROBNP 188 10/20/2016 1038   PROBNP 59.9 05/04/2011 0517    Imaging: VAS [place] ABI WITH/WO TBI  Result Date: 12/14/2020  LOWER EXTREMITY DOPPLER STUDY Patient Name:  AHIJAH DEVERY  Date of Exam:   12/14/2020 Medical Rec #: [id number]           Accession #:    [id number] Date of Birth: 31-Jan-1941           Patient Gender: M Patient Age:   80Y Exam Location:  [facility] Procedure:      VAS [place] ABI WITH/WO TBI Referring Phys: [id number] Cristal Deer J CLARK --------------------------------------------------------------------------------  Indications: Peripheral artery disease. High Risk Factors: Hypertension, hyperlipidemia, Diabetes, past history of                    smoking, coronary artery disease.  Vascular Interventions: 08/22/2020: Left femoral-popliteal bypass graft using  right greater nonreversed saphenous vein graft. Comparison Study: 07/23/2020: Rt ABI 1.13; Lt ABI 0.58 Performing Technologist: Ethelle Lyon  Examination Guidelines: A complete evaluation includes at minimum, Doppler waveform signals and systolic blood pressure reading at the level of bilateral brachial, anterior tibial, and posterior tibial arteries, when vessel segments are accessible. Bilateral testing is considered an integral part of a complete examination. Photoelectric Plethysmograph (PPG) waveforms and toe systolic pressure readings are included as required and additional duplex testing as needed. Limited examinations  for reoccurring indications may be performed as noted.  ABI Findings: +---------+------------------+-----+---------+--------+ Right    Rt Pressure (mmHg)IndexWaveform Comment  +---------+------------------+-----+---------+--------+ Brachial 158                                      +---------+------------------+-----+---------+--------+ PTA      195               1.19 triphasic         +---------+------------------+-----+---------+--------+ DP       191               1.16 triphasic         +---------+------------------+-----+---------+--------+ Great Toe103               0.63                   +---------+------------------+-----+---------+--------+ +---------+------------------+-----+--------+-------+ Left     Lt Pressure (mmHg)IndexWaveformComment +---------+------------------+-----+--------+-------+ Brachial 164                                    +---------+------------------+-----+--------+-------+ PTA      146               0.89 biphasic        +---------+------------------+-----+--------+-------+ DP       157               0.96 biphasic        +---------+------------------+-----+--------+-------+ Great Toe103               0.63                 +---------+------------------+-----+--------+-------+ +-------+-----------+-----------+------------+----------------+ ABI/TBIToday's ABIToday's TBIPrevious ABIPrevious TBI     +-------+-----------+-----------+------------+----------------+ Right  1.19       0.63       1.13        Unable to obtain +-------+-----------+-----------+------------+----------------+ Left   0.96       0.63       0.58        0.15             +-------+-----------+-----------+------------+----------------+  Left TBIs and ABIs appear increased compared to prior study on 07/23/2020.  Summary: Right: Resting right ankle-brachial index is within normal range. No evidence of significant right lower extremity arterial disease.  The right toe-brachial index is abnormal. Left: Resting left ankle-brachial index is within normal range. No evidence of significant left lower extremity arterial disease. The left toe-brachial index is abnormal.  *See table(s) above for measurements and observations.  Electronically signed by Lemar Livings MD on 12/14/2020 at 11:03:22 AM.    Final    CUP PACEART REMOTE DEVICE CHECK  Result Date: 12/28/2020 Scheduled remote reviewed. Normal device function.  3 NS episodes all 1 sec in duration. Rates 150's-170's bpm. EGM  shows 1:1. Next remote 91 days. LH  VAS [place] LOWER EXTREMITY ARTERIAL DUPLEX  Result Date: 12/14/2020 LOWER EXTREMITY ARTERIAL DUPLEX STUDY Patient Name:  DEMARIAN EPPS  Date of Exam:   12/14/2020 Medical Rec #: [id number]           Accession #:    [id number] Date of Birth: [birth year]           Patient Gender: M Patient Age:   80Y Exam Location:  [facility] Procedure:      VAS [place] LOWER EXTREMITY ARTERIAL DUPLEX Referring Phys: [id number] Cephus Shelling --------------------------------------------------------------------------------  Indications: Peripheral artery disease. High Risk Factors: Hypertension, hyperlipidemia, Diabetes, past history of                    smoking, coronary artery disease. Other Factors: 03/02/2020: Known thoracic aneurysm measuring 4 cm at aortic                isthmus and supravisceral abdominal aorta at 3.4 cm per CT.  Vascular Interventions: 08/22/2020: Left femoral-popliteal bypass graft using                         right greater nonreversed saphenous vein graft. Current ABI:            Lt ABI 0.96 Comparison Study: 07/31/2020: Total occlusion noted in the popliteal artery.                   Total occlusion noted in the anterior tibial artery.                   Thrombosed popliteal artery aneurysm measuring 2.5 cm.                   Dampened monophasic flow in the TP trunk, posterior tibial and                   visualized portion of the  peroneal artery. Performing Technologist: Ethelle Lyon  Examination Guidelines: A complete evaluation includes B-mode imaging, spectral Doppler, color Doppler, and power Doppler as needed of all accessible portions of each vessel. Bilateral testing is considered an integral part of a complete examination. Limited examinations for reoccurring indications may be performed as noted.   +-----------+--------+-----+---------------+--------+--------------------------+ LEFT       PSV cm/sRatioStenosis       WaveformComments                   +-----------+--------+-----+---------------+--------+--------------------------+ CFA Prox   123                                                            +-----------+--------+-----+---------------+--------+--------------------------+ CFA Distal 116                                                            +-----------+--------+-----+---------------+--------+--------------------------+ DFA        198          30-49% stenosis                                   +-----------+--------+-----+---------------+--------+--------------------------+  POP Mid                 occluded               thrombosed aneurysm 2.6 cm +-----------+--------+-----+---------------+--------+--------------------------+ ATA Mid                 occluded                                          +-----------+--------+-----+---------------+--------+--------------------------+ PTA Distal 104                                                            +-----------+--------+-----+---------------+--------+--------------------------+ PERO Distal72                                                             +-----------+--------+-----+---------------+--------+--------------------------+  Left Graft #1: Femoral-popliteal +--------------------+--------+--------+--------+--------+                     PSV cm/sStenosisWaveformComments  +--------------------+--------+--------+--------+--------+ Inflow              116                              +--------------------+--------+--------+--------+--------+ Proximal Anastomosis111                              +--------------------+--------+--------+--------+--------+ Proximal Graft      107                              +--------------------+--------+--------+--------+--------+ Mid Graft           122                              +--------------------+--------+--------+--------+--------+ Distal Graft        146                              +--------------------+--------+--------+--------+--------+ Distal Anastomosis  180                              +--------------------+--------+--------+--------+--------+ Outflow             148                              +--------------------+--------+--------+--------+--------+   Summary: Left: Total occlusion noted in the anterior tibial artery. Patent left femoral-popliteal bypass graft. Thrombosed popliteal artery aneurysm measuring 2.6 cm.  See table(s) above for measurements and observations. Electronically signed by Lemar Livings MD on 12/14/2020 at 11:03:37 AM.    Final    VAS [place] LOWER EXTREMITY VENOUS (DVT)  Result Date: 12/13/2020  Lower Venous DVT Study Patient Name:  LESLIE  Paul Half  Date of Exam:   12/13/2020 Medical Rec #: [id number]           Accession #:    [id number] Date of Birth: Jul 13, 1941           Patient Gender: M Patient Age:   80Y Exam Location:  [facility] Procedure:      VAS [place] LOWER EXTREMITY VENOUS (DVT) Referring Phys: [id number] Cephus Shelling --------------------------------------------------------------------------------  Indications: Swelling, and Pain. Other Indications: History of known aneurysm in bilateral popliteal arteries. Comparison Study: New Performing Technologist: Ethelle Lyon  Examination Guidelines: A complete evaluation includes B-mode imaging,  spectral Doppler, color Doppler, and power Doppler as needed of all accessible portions of each vessel. Bilateral testing is considered an integral part of a complete examination. Limited examinations for reoccurring indications may be performed as noted. The reflux portion of the exam is performed with the patient in reverse Trendelenburg.  +---------+---------------+---------+-----------+----------+--------------+ RIGHT    CompressibilityPhasicitySpontaneityPropertiesThrombus Aging +---------+---------------+---------+-----------+----------+--------------+ CFV      Full           Yes      Yes                                 +---------+---------------+---------+-----------+----------+--------------+ SFJ      Full                                                        +---------+---------------+---------+-----------+----------+--------------+ FV Prox  Full                                                        +---------+---------------+---------+-----------+----------+--------------+ FV Mid   Full           Yes      Yes                                 +---------+---------------+---------+-----------+----------+--------------+ FV DistalFull                                                        +---------+---------------+---------+-----------+----------+--------------+ PFV      Full                                                        +---------+---------------+---------+-----------+----------+--------------+ POP      Full           Yes      Yes                                 +---------+---------------+---------+-----------+----------+--------------+ PTV      Full                                                        +---------+---------------+---------+-----------+----------+--------------+  PERO     Full                                                        +---------+---------------+---------+-----------+----------+--------------+ Gastroc   Full                                                        +---------+---------------+---------+-----------+----------+--------------+ GSV      Full                                                        +---------+---------------+---------+-----------+----------+--------------+ SSV      Full                                                        +---------+---------------+---------+-----------+----------+--------------+ Right popliteal artery is aneurysmal measuring 2.6 cm.  +---------+---------------+---------+-----------+---------------+-------------+ LEFT     CompressibilityPhasicitySpontaneityProperties     Thrombus                                                                 Aging         +---------+---------------+---------+-----------+---------------+-------------+ CFV      Full           Yes      Yes                                     +---------+---------------+---------+-----------+---------------+-------------+ SFJ      Full                                                            +---------+---------------+---------+-----------+---------------+-------------+ FV Prox  Full                                                            +---------+---------------+---------+-----------+---------------+-------------+ FV Mid   Full           Yes      Yes                                     +---------+---------------+---------+-----------+---------------+-------------+ FV DistalFull                                                            +---------+---------------+---------+-----------+---------------+-------------+  PFV      Full                                                            +---------+---------------+---------+-----------+---------------+-------------+ POP      Full           Yes      Yes                                     +---------+---------------+---------+-----------+---------------+-------------+ PTV       Full                                                            +---------+---------------+---------+-----------+---------------+-------------+ PERO     Full                               Not well                                                                 visualized                   +---------+---------------+---------+-----------+---------------+-------------+ Gastroc  Full                                                            +---------+---------------+---------+-----------+---------------+-------------+ GSV      Full                                                            +---------+---------------+---------+-----------+---------------+-------------+ SSV      Full                                                            +---------+---------------+---------+-----------+---------------+-------------+ 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    Summary: BILATERAL: - No evidence of deep vein thrombosis seen in the lower extremities, bilaterally. - No evidence of superficial venous thrombosis in the lower extremities, bilaterally. -No evidence of popliteal cyst, bilaterally.   *See table(s) above for measurements and observations. Electronically signed by Waverly Ferrari MD on 12/13/2020 at 9:57:09 AM.    Final    VAS [place] LOWER EXTREMITY VENOUS REFLUX  Result Date: 12/14/2020  Lower Venous Reflux Study Patient  Name:  [name]  Date of Exam:   12/14/2020 Medical Rec #: [id number]           Accession #:    [id number] Date of Birth: 11/25/1940           Patient Gender: M Patient Age:   80Y Exam Location:  [facility] Procedure:      VAS [place] LOWER EXTREMITY VENOUS REFLUX Referring Phys: [id number] Canary Brim CLARK --------------------------------------------------------------------------------  Indications: Pain, Swelling, and ulceration.  Risk Factors: Surgery 08/22/2020: Left femoral-popliteal bypass graft using right greater  nonreversed saphenous vein graft. Limitations: Poor ultrasound/tissue interface.  Comparison Study: 12/13/2020: No evidence of deep vein thrombosis bilaterally. Performing Technologist: Ethelle Lyon  Examination Guidelines: A complete evaluation includes B-mode imaging, spectral Doppler, color Doppler, and power Doppler as needed of all accessible portions of each vessel. Bilateral testing is considered an integral part of a complete examination. Limited examinations for reoccurring indications may be performed as noted. The reflux portion of the exam is performed with the patient in reverse Trendelenburg. Significant venous reflux is defined as >500 ms in the superficial venous system, and >1 second in the deep venous system.  Venous Reflux Times +-------------+---------+------+-----------+------------+---------------------+ RIGHT        Reflux NoRefluxReflux TimeDiameter cmsComments                                     Yes                                               +-------------+---------+------+-----------+------------+---------------------+ CFV          no                                    pulsatile venous flow +-------------+---------+------+-----------+------------+---------------------+ FV mid       no                                                          +-------------+---------+------+-----------+------------+---------------------+ Popliteal              yes   >1 second                                   +-------------+---------+------+-----------+------------+---------------------+ GSV at SFJ                                         harvested for bypass  +-------------+---------+------+-----------+------------+---------------------+ SSV Pop Fossano                            0.27                          +-------------+---------+------+-----------+------------+---------------------+ SSV prox calf          yes    >500 ms  0.28                           +-------------+---------+------+-----------+------------+---------------------+ SSV mid calf           yes    >500 ms      0.32                          +-------------+---------+------+-----------+------------+---------------------+  +---------------------------+---------+------+-----------+------------+--------+ LEFT                       Reflux NoRefluxReflux TimeDiameter cmsComments                                      Yes                                  +---------------------------+---------+------+-----------+------------+--------+ CFV                        no                                             +---------------------------+---------+------+-----------+------------+--------+ FV mid                     no                                             +---------------------------+---------+------+-----------+------------+--------+ Popliteal                            yes   >1 second                      +---------------------------+---------+------+-----------+------------+--------+ GSV at SFJ                           yes    >500 ms      0.92             +---------------------------+---------+------+-----------+------------+--------+ GSV prox thigh             no                            0.72             +---------------------------+---------+------+-----------+------------+--------+ GSV mid thigh              no                            0.40             +---------------------------+---------+------+-----------+------------+--------+ GSV dist thigh             no                            0.34             +---------------------------+---------+------+-----------+------------+--------+ GSV  at knee                no                            0.28             +---------------------------+---------+------+-----------+------------+--------+ GSV prox calf                        yes    >500 ms      0.41              +---------------------------+---------+------+-----------+------------+--------+ SSV Pop Fossa              no                            0.33             +---------------------------+---------+------+-----------+------------+--------+ SSV prox calf              no                            0.39             +---------------------------+---------+------+-----------+------------+--------+ SSV mid calf               no                            0.32             +---------------------------+---------+------+-----------+------------+--------+ Anterior accessory                   yes    >500 ms                       saphenous vein thigh                                                      +---------------------------+---------+------+-----------+------------+--------+ Distal calf perforator               yes    >500 ms                       +---------------------------+---------+------+-----------+------------+--------+   Summary: Bilateral: - No evidence of deep vein thrombosis seen in the lower extremities, bilaterally, from the common femoral through the popliteal veins.  Right: - Venous reflux is noted in the right popliteal vein. - Venous reflux is noted in the right short saphenous vein. - Right greater saphenous vein harvested for bypass.  Left: - Venous reflux is noted in the left sapheno-femoral junction. - Venous reflux is noted in the left greater saphenous vein in the calf. - Venous reflux is noted in the left popliteal vein. - Venous reflux is noted in the left distal calf perforator vein. - Venous reflux is noted in the left anterior accessory saphenous vein.  *See table(s) above for measurements and observations. Electronically signed by Lemar Livings MD on 12/14/2020 at 11:03:16 AM.    Final       PFT Results Latest Ref Rng & Units 11/16/2015 12/30/2013  FVC-Pre L 2.96 3.01  FVC-Predicted Pre %  59 59  FVC-Post L 2.89 3.01  FVC-Predicted Post % 58 59  Pre  FEV1/FVC % % 43 45  Post FEV1/FCV % % 46 49  FEV1-Pre L 1.28 1.36  FEV1-Predicted Pre % 35 36  FEV1-Post L 1.33 1.48  DLCO uncorrected ml/min/mmHg 9.88 10.13  DLCO UNC% % 26 26  DLCO corrected ml/min/mmHg 9.94 -  DLCO COR %Predicted % 26 -  DLVA Predicted % 40 39  TLC L - 6.31  TLC % Predicted % - 80  RV % Predicted % - 80    No results found for: NITRICOXIDE      Assessment & Plan:   COPD (chronic obstructive pulmonary disease) (HCC) Recent COPD exacerbation now resolved and back to baseline.  Patient is continue on current regimen.  Seems to be doing well on Stiolto.  Plan  Patient Instructions  Continue on Stiolto 2 puffs daily .  Mucinex DM Twice daily  As needed  Cough/congestion .  Continue on Oxygen 2l/m  Continue on CPAP At bedtime  With oxygen . Wear with Naps.  Activity as tolerated.  Chest xray today .  Follow up in 3-4 Months  with Dr. Vassie Loll or Dionysios Massman NP and As needed   Please contact office for sooner follow up if symptoms do not improve or worsen or seek emergency care       Chronic respiratory failure with hypoxia (HCC)  compensated on present regimen.  Continue on O2 to maintain O2 saturations greater than 88 to 90%.  No increased oxygen demands.  Chronic systolic CHF (congestive heart failure) (HCC) Appears euvolemic on exam  OSA on CPAP Excellent control and compliance on CPAP continue on current settings  Pneumonia Probable recent pneumonia with associated COPD exacerbation with increased markings consistent with infectious process on the right lung.  Patient is clinically improved check chest x-ray today.    I spent   40 minutes dedicated to the care of this patient on the date of this encounter to include pre-visit review of records, face-to-face time with the patient discussing conditions above, post visit ordering of testing, clinical documentation with the electronic health record, making appropriate referrals as documented, and  communicating necessary findings to members of the patients care team.   Rubye Oaks, NP 01/10/2021

## 2021-01-11 NOTE — Progress Notes (Signed)
Attempted to call patient. Left message for patient to return call.  

## 2021-01-14 ENCOUNTER — Ambulatory Visit: Payer: Medicare PPO

## 2021-01-14 ENCOUNTER — Telehealth: Payer: Self-pay | Admitting: Adult Health

## 2021-01-14 ENCOUNTER — Other Ambulatory Visit: Payer: Self-pay | Admitting: *Deleted

## 2021-01-14 DIAGNOSIS — J189 Pneumonia, unspecified organism: Secondary | ICD-10-CM

## 2021-01-14 DIAGNOSIS — J449 Chronic obstructive pulmonary disease, unspecified: Secondary | ICD-10-CM | POA: Diagnosis not present

## 2021-01-14 NOTE — Telephone Encounter (Signed)
Pt is returning phone call. Pls regard; [phone]

## 2021-01-14 NOTE — Progress Notes (Signed)
Called and spoke with patient, provided results/recommendations per Rubye Oaks NP.  Patient verbalized understanding.  Scheduled patient for f/u with TP on 03/12/21 at 9 am with CXR prior to visit, patient advised to arrive at 8:45 am for check in and cxr.  He verbalized understanding.

## 2021-01-14 NOTE — Telephone Encounter (Signed)
See results note. Patient aware.

## 2021-01-15 ENCOUNTER — Other Ambulatory Visit: Payer: Self-pay

## 2021-01-15 ENCOUNTER — Ambulatory Visit (INDEPENDENT_AMBULATORY_CARE_PROVIDER_SITE_OTHER): Payer: Medicare PPO | Admitting: *Deleted

## 2021-01-15 VITALS — BP 160/58 | HR 82 | Temp 97.9°F | Resp 18

## 2021-01-15 DIAGNOSIS — L97909 Non-pressure chronic ulcer of unspecified part of unspecified lower leg with unspecified severity: Secondary | ICD-10-CM | POA: Diagnosis not present

## 2021-01-15 DIAGNOSIS — I87319 Chronic venous hypertension (idiopathic) with ulcer of unspecified lower extremity: Secondary | ICD-10-CM | POA: Diagnosis not present

## 2021-01-15 NOTE — Progress Notes (Signed)
Remote pacemaker transmission.   

## 2021-01-15 NOTE — Progress Notes (Signed)
Unna boot removed. Anterior lower leg wound is healed. Posterior wound near the ankle appears to be healing. Wound was oozing blood colored drainage when dressing removed. Unna boot replaced. Clinic MDs: Lenell Antu and Chestine Spore

## 2021-01-15 NOTE — Progress Notes (Signed)
Opened in error

## 2021-01-22 ENCOUNTER — Ambulatory Visit: Payer: Medicare PPO

## 2021-01-22 ENCOUNTER — Other Ambulatory Visit: Payer: Self-pay

## 2021-01-22 DIAGNOSIS — L97929 Non-pressure chronic ulcer of unspecified part of left lower leg with unspecified severity: Secondary | ICD-10-CM | POA: Diagnosis not present

## 2021-01-22 DIAGNOSIS — I83009 Varicose veins of unspecified lower extremity with ulcer of unspecified site: Secondary | ICD-10-CM

## 2021-01-22 NOTE — Progress Notes (Signed)
Patient present in office today with his daughter Lamar Laundry for Unilateral Unna boot change of the LLE. Today the wound on the posterior lower leg near ankle looks a lot better in appearance but hasn't completely healed. He reports no pains or drainage. Today the unna boot was replaced without difficulty.Patient tolerated Unna boot wrapping well & advised he is scheduled to return to our office next week for his unna boot change (01/29/21) and the following week to be seen by Dr. Chestine Spore  (02/05/21). Patient voiced his understanding.    Supervising MD: Dr. Heath Lark Zissy Hamlett A.,CMA

## 2021-01-24 DIAGNOSIS — J8410 Pulmonary fibrosis, unspecified: Secondary | ICD-10-CM | POA: Diagnosis not present

## 2021-01-24 DIAGNOSIS — E860 Dehydration: Secondary | ICD-10-CM | POA: Diagnosis not present

## 2021-01-24 DIAGNOSIS — J449 Chronic obstructive pulmonary disease, unspecified: Secondary | ICD-10-CM | POA: Diagnosis not present

## 2021-01-24 DIAGNOSIS — G4733 Obstructive sleep apnea (adult) (pediatric): Secondary | ICD-10-CM | POA: Diagnosis not present

## 2021-01-29 ENCOUNTER — Ambulatory Visit: Payer: Medicare PPO

## 2021-01-29 ENCOUNTER — Other Ambulatory Visit: Payer: Self-pay

## 2021-01-29 DIAGNOSIS — L97909 Non-pressure chronic ulcer of unspecified part of unspecified lower leg with unspecified severity: Secondary | ICD-10-CM

## 2021-01-29 DIAGNOSIS — I87319 Chronic venous hypertension (idiopathic) with ulcer of unspecified lower extremity: Secondary | ICD-10-CM

## 2021-02-05 ENCOUNTER — Ambulatory Visit: Payer: Medicare PPO | Admitting: Vascular Surgery

## 2021-02-05 ENCOUNTER — Encounter: Payer: Self-pay | Admitting: Vascular Surgery

## 2021-02-05 ENCOUNTER — Other Ambulatory Visit: Payer: Self-pay

## 2021-02-05 VITALS — BP 164/73 | HR 59 | Temp 97.7°F | Resp 18 | Ht 75.0 in | Wt 213.0 lb

## 2021-02-05 DIAGNOSIS — L97909 Non-pressure chronic ulcer of unspecified part of unspecified lower leg with unspecified severity: Secondary | ICD-10-CM | POA: Diagnosis not present

## 2021-02-05 DIAGNOSIS — I87319 Chronic venous hypertension (idiopathic) with ulcer of unspecified lower extremity: Secondary | ICD-10-CM

## 2021-02-05 DIAGNOSIS — I724 Aneurysm of artery of lower extremity: Secondary | ICD-10-CM

## 2021-02-05 NOTE — Progress Notes (Signed)
Patient name: [name] MRN: [id number] DOB: July 12, 1941 Sex: male  [name], wound check left leg for venous ulcers  HPI: [name] is a 80 y.o. male with multiple medical comorbidities who presents for 1 month wound check for continued monitoring of left lower extremity venous ulcers.  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  Of note he does have a history of an aortobiiliac bypass for AAA in 2004.  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  He was sent to see me again from the [facility] clinic with bilateral lower extremity wounds.  Turns out the right wounds had healed.  On the left we did duplex imaging that showed a widely patent bypass but appeared to have significant venous insufficiency in the left leg with open ulcerations and we put him in Unna boots.    He just completed his Unna boots last week.  Both ulcers are now healed.  States he is having a hard time wearing his compression stockings.  Past Medical History:  Diagnosis Date   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   ablated   Anxiety    Aortic aneurysm (HCC) 09/02/2017   BENIGN PROSTATIC HYPERTROPHY 11/23/2009   Cardiomyopathy (HCC) 08/28/2016   Chronic systolic CHF (congestive heart failure) (HCC) 09/02/2017   COPD (chronic obstructive pulmonary disease) (HCC)    CORONARY ARTERY DISEASE 11/23/2009   DECREASED HEARING, 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   DEGENERATIVE JOINT DISEASE 11/23/2009   DEPRESSION 11/23/2009   FATIGUE 11/23/2009   GAIT DISTURBANCE 12/10/2009   HEMOPTYSIS UNSPECIFIED 05/07/2010   High cholesterol    HYPERTENSION 07/30/2009   HYPOTHYROIDISM 07/30/2009   Ischemic cardiomyopathy 09/02/2017   LUMBAR RADICULOPATHY, 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   On home oxygen therapy    "2-3L; 24/7" (09/10/2016)   OSA on CPAP     Pneumonia    PTSD (post-traumatic stress disorder) 03/10/2012   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   RA (rheumatoid arthritis) (HCC) 06/11/2011   "qwhere" (09/10/2016)   RESPIRATORY FAILURE, CHRONIC 07/31/2009   Scleritis of both eyes 03/17/2014   Thrombocytopenia (HCC)    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   Type II diabetes mellitus (HCC)     Past Surgical History:  Procedure Laterality Date   ABDOMINAL AORTIC ANEURYSM REPAIR  07/2002   [name] 12/10/2010   ABDOMINAL AORTOGRAM W/LOWER EXTREMITY N/A 08/16/2020   Procedure: ABDOMINAL AORTOGRAM W/LOWER EXTREMITY;  Surgeon: Cephus Shelling, MD;  Location: [facility] INVASIVE CV LAB;  Service: Cardiovascular;  Laterality: N/A;   ABDOMINAL EXPLORATION SURGERY  02/2004   w/LOA/notes 12/10/2010; small bowel obstruction repair with adhesiolysis    BACK SURGERY     CARDIAC CATHETERIZATION     2 heart caths in the past.  One in 2000s showed one ulcerated plaque  Rx medically; [facility] /notes 09/05/2016   CATARACT EXTRACTION W/ INTRAOCULAR LENS  IMPLANT, BILATERAL Bilateral 2000s   COLECTOMY     hx of remote ileum resection due to bleeding   COLONOSCOPY WITH PROPOFOL N/A 01/22/2018   Procedure: COLONOSCOPY WITH PROPOFOL;  Surgeon: Iva Boop, MD;  Location: [facility] ENDOSCOPY;  Service: Endoscopy;  Laterality: N/A;   CORONARY ANGIOPLASTY WITH STENT PLACEMENT  09/10/2016   CORONARY STENT INTERVENTION N/A 09/10/2016   Procedure: Coronary Stent Intervention;  Surgeon: Kathleene Hazel, MD;  Location: [facility]  INVASIVE CV LAB;  Service: Cardiovascular;  Laterality: N/A;  Distal RCA 4.0x16 Synergy   ESOPHAGOGASTRODUODENOSCOPY (EGD) WITH PROPOFOL N/A 01/22/2018   Procedure: ESOPHAGOGASTRODUODENOSCOPY (EGD) WITH PROPOFOL;  Surgeon: Iva Boop, MD;  Location: [facility] ENDOSCOPY;  Service: Endoscopy;  Laterality: N/A;   EYE SURGERY     FALSE ANEURYSM REPAIR Left 08/22/2020   Procedure: EXCLUSION OF LEFT POPLITEAL ARTERY ANEURYSM;  Surgeon: Cephus Shelling, MD;  Location: [facility];   Service: Vascular;  Laterality: Left;   FEMORAL EMBOLOECTOMY Left 07/2000   with left leg ischemia; Dr. Hart Rochester, vascular   FEMORAL-POPLITEAL BYPASS GRAFT Bilateral 08/22/2020   Procedure: LEFT FEMORAL-POSTERIOR TIBIAL ARTERY BYPASS GRAFT USING RIGHT GREATER NONREVERSED SAPHENOUS VEIN GRAFT;  Surgeon: Cephus Shelling, MD;  Location: [facility] OR;  Service: Vascular;  Laterality: Bilateral;   GANGLION CYST EXCISION Right    "wrist"; Dr. Teressa Senter   HOT HEMOSTASIS N/A 01/22/2018   Procedure: HOT HEMOSTASIS (ARGON PLASMA COAGULATION/BICAP);  Surgeon: Iva Boop, MD;  Location: [facility];  Service: Endoscopy;  Laterality: N/A;   LOOP RECORDER INSERTION N/A 04/25/2019   Procedure: LOOP RECORDER INSERTION;  Surgeon: Duke Salvia, MD;  Location: [facility] INVASIVE CV LAB;  Service: Cardiovascular;  Laterality: N/A;   LOOP RECORDER REMOVAL N/A 07/01/2019   Procedure: LOOP RECORDER REMOVAL;  Surgeon: Duke Salvia, MD;  Location: [facility] INVASIVE CV LAB;  Service: Cardiovascular;  Laterality: N/A;   LUMBAR LAMINECTOMY  1972   Dr. Fannie Knee   PACEMAKER IMPLANT N/A 07/01/2019   Procedure: PACEMAKER IMPLANT;  Surgeon: Duke Salvia, MD;  Location: [facility] INVASIVE CV LAB;  Service: Cardiovascular;  Laterality: N/A;   RIGHT/LEFT HEART CATH AND CORONARY ANGIOGRAPHY N/A 09/10/2016   Procedure: Right/Left Heart Cath and Coronary Angiography;  Surgeon: Kathleene Hazel, MD;  Location: [facility] INVASIVE CV LAB;  Service: Cardiovascular;  Laterality: N/A;   TONSILLECTOMY      Family History  Problem Relation Age of Onset   Other Mother        gun shot    SOCIAL HISTORY: Social History   Tobacco Use   Smoking status: Former    Packs/day: 2.50    Years: 40.00    Pack years: 100.00    Types: Cigarettes, Pipe, Cigars    Quit date: 07/28/1998    Years since quitting: 22.5   Smokeless tobacco: Never  Substance Use Topics   Alcohol use: No    Alcohol/week: 0.0 standard drinks    No Known Allergies  Current Outpatient  Medications  Medication Sig Dispense Refill   albuterol (PROVENTIL) (2.5 MG/3ML) 0.083% nebulizer solution Take 3 mLs (2.5 mg total) by nebulization every 6 (six) hours as needed for wheezing or shortness of breath. 120 mL 12   albuterol (VENTOLIN HFA) 108 (90 Base) MCG/ACT inhaler Inhale 2 puffs into the lungs every 6 (six) hours as needed for wheezing or shortness of breath. 8 g 6   atorvastatin (LIPITOR) 40 MG tablet Take 1 tablet (40 mg total) by mouth daily. 90 tablet 3   Carboxymethylcellul-Glycerin (LUBRICATING EYE DROPS OP) Place 1 drop into both eyes 4 (four) times daily as needed (dry eyes).      Cholecalciferol (VITAMIN D3) 2000 units capsule Take 2,000 Units by mouth daily.      empagliflozin (JARDIANCE) 10 MG TABS tablet Take 5 mg by mouth daily. Filled at [facility].     gabapentin (NEURONTIN) 100 MG capsule Take 1 capsule (100 mg total) by mouth 2 (two) times daily. 90 capsule 0  insulin glargine (LANTUS) 100 UNIT/ML injection Inject 60 Units into the skin at bedtime.      levothyroxine (SYNTHROID) 150 MCG tablet Take 150 mcg by mouth daily before breakfast.     losartan (COZAAR) 50 MG tablet Take 1 tablet (50 mg total) by mouth daily. 90 tablet 3   Menthol, Topical Analgesic, (BIOFREEZE EX) Apply 1 application topically 3 (three) times daily as needed (pain. (calf muscle)).     metFORMIN (GLUCOPHAGE) 500 MG tablet Take 500-1,000 mg by mouth 2 (two) times daily with a meal. Take 1 tablet (500 mg) by mouth in the morning & take 2 tablets (1000 mg) by mouth in the evening.     Multiple Vitamins-Minerals (MEGA MULTIVITAMIN FOR MEN PO) Take 1 tablet by mouth daily.     OXYGEN Inhale 2 L into the lungs continuous.     predniSONE (DELTASONE) 5 MG tablet Take 1 tablet (5 mg total) by mouth daily with breakfast. 90 tablet 3   Semaglutide (OZEMPIC, 1 MG/DOSE, Stacy) Inject 1 mg into the skin every Friday.     tamsulosin (FLOMAX) 0.4 MG CAPS capsule Take 1 capsule (0.4 mg total) by mouth at bedtime. 30  capsule 0   Tiotropium Bromide-Olodaterol (STIOLTO RESPIMAT) 2.5-2.5 MCG/ACT AERS Inhale 2 puffs into the lungs daily. 4 g 0   Tiotropium Bromide-Olodaterol (STIOLTO RESPIMAT) 2.5-2.5 MCG/ACT AERS Inhale 2 puffs into the lungs daily. 2 each 0   zolpidem (AMBIEN) 5 MG tablet Take 1 tablet (5 mg total) by mouth at bedtime as needed for sleep. 30 tablet 0   furosemide (LASIX) 40 MG tablet Take 1 tablet (40 mg total) by mouth daily. (Patient taking differently: Take 40 mg by mouth daily as needed (fluid retention.).) 90 tablet 3   No current facility-administered medications for this visit.    REVIEW OF SYSTEMS:  [X]  denotes positive finding, [ ]  denotes negative finding Cardiac  Comments:  Chest pain or chest pressure:    Shortness of breath upon exertion:    Short of breath when lying flat:    Irregular heart rhythm:        Vascular    Pain in calf, thigh, or hip brought on by ambulation:    Pain in feet at night that wakes you up from your sleep:     Blood clot in your veins:    Leg swelling:         Pulmonary    Oxygen at home:    Productive cough:     Wheezing:         Neurologic    Sudden weakness in arms or legs:     Sudden numbness in arms or legs:     Sudden onset of difficulty speaking or slurred speech:    Temporary loss of vision in one eye:     Problems with dizziness:         Gastrointestinal    Blood in stool:     Vomited blood:         Genitourinary    Burning when urinating:     Blood in urine:        Psychiatric    Major depression:         Hematologic    Bleeding problems:    Problems with blood clotting too easily:        Skin    Rashes or ulcers:        Constitutional    Fever or chills:  PHYSICAL EXAM: Vitals:   02/05/21 1349  BP: (!) 164/73  Pulse: (!) 59  Resp: 18  Temp: 97.7 F (36.5 C)  TempSrc: Temporal  SpO2: 93%  Weight: 213 lb (96.6 kg)  Height: 6\' 3"  (1.905 m)    GENERAL: The patient is a well-nourished male, in no  acute distress. The vital signs are documented above. CARDIAC: There is a regular rate and rhythm.  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    We have now been following him for new ulcerations in the left lower extremity that appear to be venous ulcerations.  He does have significant venous insufficiency on duplex.  We had previously placed him in Unna boots and he has been getting weekly Unna boot changes.  Both venous ulcers are now healed.  We are going to transition him to compression stockings with 15 to 20 mmHg given a tighter stocking has been difficult for him to wear.  I will have him come back in 6 months for bilateral lower extremity arterial duplexes to perform surveillance of his left leg bypass and also to monitor the right popliteal aneurysm.  He wants to defer repair of his right popliteal aneurysm at this time given I think it is probably going to require open surgery and would not be good anatomy for stent graft repair.     Cephus Shelling, MD [facility] Office: [phone]

## 2021-02-06 NOTE — Progress Notes (Signed)
Patient present in office today with his daughter [name] for Unilateral Unna boot change of the LLE. Today the wound on the posterior lower leg near ankle looks a lot better in appearance & in now completely healed. He reports no pains or drainage. Today the patient no longer needs a unna boot dressing .Patient advised to wear Knee high compression hose, educated on proper elevation techniques & advised he is scheduled to return to our office next week to be seen by Dr. Chestine Spore  (02/05/21). Patient voiced his understanding.   Supervising MD: Dorian Furnace A.,CMA

## 2021-02-07 ENCOUNTER — Other Ambulatory Visit: Payer: Self-pay

## 2021-02-07 DIAGNOSIS — I724 Aneurysm of artery of lower extremity: Secondary | ICD-10-CM

## 2021-02-13 DIAGNOSIS — G4733 Obstructive sleep apnea (adult) (pediatric): Secondary | ICD-10-CM | POA: Diagnosis not present

## 2021-02-13 DIAGNOSIS — J449 Chronic obstructive pulmonary disease, unspecified: Secondary | ICD-10-CM | POA: Diagnosis not present

## 2021-02-23 ENCOUNTER — Other Ambulatory Visit: Payer: Self-pay | Admitting: Cardiology

## 2021-02-23 DIAGNOSIS — G4733 Obstructive sleep apnea (adult) (pediatric): Secondary | ICD-10-CM | POA: Diagnosis not present

## 2021-02-23 DIAGNOSIS — J8410 Pulmonary fibrosis, unspecified: Secondary | ICD-10-CM | POA: Diagnosis not present

## 2021-02-23 DIAGNOSIS — J449 Chronic obstructive pulmonary disease, unspecified: Secondary | ICD-10-CM | POA: Diagnosis not present

## 2021-02-23 DIAGNOSIS — E860 Dehydration: Secondary | ICD-10-CM | POA: Diagnosis not present

## 2021-02-25 NOTE — Progress Notes (Signed)
HPI: FU CAD. 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 Carotid dopplers 2/19 showed no significant stenosis. Admitted with sepsis 2/19 and also with atrial fibrillation, converted spontaneously to sinus; TSH low and synthroid adjusted. Placed on amiodarone. Amiodarone discontinued at previous office visit. Echocardiogram August 2020 showed normal LV function. Had pacemaker placed 12/20 due to history of syncope and pauses noted on monitor. 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 Apixaban on hold. CTA August 2021 showed dilated aortic root at 4 cm, abdominal aorta measuring 3.4 cm, left renal artery measuring 2.1 cm and right internal iliac artery measuring 2 cm.  ABIs May 2022 normal; total occlusion of the anterior tibial artery with patent left femoropopliteal bypass graft noted.  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  Peripheral vascular disease followed by vascular surgery.  Since last seen, he denies increased dyspnea, palpitations or syncope.  Chronic minimal pedal edema.  He occasionally has some pain in his left shoulder that increases with arm movements but no exertional chest pain.  Current Outpatient Medications  Medication Sig Dispense Refill   albuterol (PROVENTIL) (2.5 MG/3ML) 0.083% nebulizer solution Take 3 mLs (2.5 mg total) by nebulization every 6 (six) hours as needed for wheezing or shortness of breath. 120 mL 12   albuterol (VENTOLIN HFA) 108 (90 Base) MCG/ACT inhaler Inhale 2 puffs into the lungs every 6 (six) hours as needed for wheezing or shortness of breath. 8 g 6   apixaban (ELIQUIS) 5 MG TABS tablet TAKE 1 TABLET(5 MG) BY MOUTH TWICE DAILY 180 tablet 1   atorvastatin (LIPITOR) 40 MG tablet Take 1 tablet (40 mg total) by mouth daily. 90 tablet 3   Carboxymethylcellul-Glycerin (LUBRICATING EYE DROPS OP) Place 1 drop into both eyes 4 (four) times daily as  needed (dry eyes).      Cholecalciferol (VITAMIN D3) 2000 units capsule Take 2,000 Units by mouth daily.      empagliflozin (JARDIANCE) 10 MG TABS tablet Take 5 mg by mouth daily. Filled at [facility].     gabapentin (NEURONTIN) 100 MG capsule Take 1 capsule (100 mg total) by mouth 2 (two) times daily. 90 capsule 0   insulin glargine (LANTUS) 100 UNIT/ML injection Inject 60 Units into the skin at bedtime.      levothyroxine (SYNTHROID) 175 MCG tablet Take 175 mcg by mouth daily before breakfast.     losartan (COZAAR) 50 MG tablet Take 1 tablet (50 mg total) by mouth daily. 90 tablet 3   Menthol, Topical Analgesic, (BIOFREEZE EX) Apply 1 application topically 3 (three) times daily as needed (pain. (calf muscle)).     metFORMIN (GLUCOPHAGE) 500 MG tablet Take 500-1,000 mg by mouth 2 (two) times daily with a meal. Take 1 tablet (500 mg) by mouth in the morning & take 2 tablets (1000 mg) by mouth in the evening.     Multiple Vitamins-Minerals (MEGA MULTIVITAMIN FOR MEN PO) Take 1 tablet by mouth daily.     OXYGEN Inhale 2 L into the lungs continuous.     predniSONE (DELTASONE) 5 MG tablet Take 1 tablet (5 mg total) by mouth daily with breakfast. 90 tablet 3   Semaglutide (OZEMPIC, 1 MG/DOSE, [place]) Inject 1 mg into the skin every Friday.     tamsulosin (FLOMAX) 0.4 MG CAPS capsule Take 1 capsule (0.4 mg total) by mouth at  bedtime. 30 capsule 0   tiotropium (SPIRIVA HANDIHALER) 18 MCG inhalation capsule Place 18 mcg into inhaler and inhale daily.     Tiotropium Bromide-Olodaterol (STIOLTO RESPIMAT) 2.5-2.5 MCG/ACT AERS Inhale 2 puffs into the lungs daily. 4 g 0   zolpidem (AMBIEN) 5 MG tablet Take 1 tablet (5 mg total) by mouth at bedtime as needed for sleep. 30 tablet 0   furosemide (LASIX) 40 MG tablet Take 1 tablet (40 mg total) by mouth daily. (Patient taking differently: Take 40 mg by mouth daily as needed (fluid retention.).) 90 tablet 3   No current facility-administered medications for this visit.      Past Medical History:  Diagnosis Date   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   ablated   Anxiety    Aortic aneurysm (HCC) 09/02/2017   BENIGN PROSTATIC HYPERTROPHY 11/23/2009   Cardiomyopathy (HCC) 08/28/2016   Chronic systolic CHF (congestive heart failure) (HCC) 09/02/2017   COPD (chronic obstructive pulmonary disease) (HCC)    CORONARY ARTERY DISEASE 11/23/2009   DECREASED HEARING, 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   DEGENERATIVE JOINT DISEASE 11/23/2009   DEPRESSION 11/23/2009   FATIGUE 11/23/2009   GAIT DISTURBANCE 12/10/2009   HEMOPTYSIS UNSPECIFIED 05/07/2010   High cholesterol    HYPERTENSION 07/30/2009   HYPOTHYROIDISM 07/30/2009   Ischemic cardiomyopathy 09/02/2017   LUMBAR RADICULOPATHY, 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   On home oxygen therapy    "2-3L; 24/7" (09/10/2016)   OSA on CPAP    Pneumonia    PTSD (post-traumatic stress disorder) 03/10/2012   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   RA (rheumatoid arthritis) (HCC) 06/11/2011   "qwhere" (09/10/2016)   RESPIRATORY FAILURE, CHRONIC 07/31/2009   Scleritis of both eyes 03/17/2014   Thrombocytopenia (HCC)    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   Type II diabetes mellitus (HCC)     Past Surgical History:  Procedure Laterality Date   ABDOMINAL AORTIC ANEURYSM REPAIR  07/2002   [name] 12/10/2010   ABDOMINAL AORTOGRAM W/LOWER EXTREMITY N/A 08/16/2020   Procedure: ABDOMINAL AORTOGRAM W/LOWER EXTREMITY;  Surgeon: Cephus Shelling, MD;  Location: [facility] INVASIVE CV LAB;  Service: Cardiovascular;  Laterality: N/A;   ABDOMINAL EXPLORATION SURGERY  02/2004   w/LOA/notes 12/10/2010; small bowel obstruction repair with adhesiolysis    BACK SURGERY     CARDIAC CATHETERIZATION     2 heart caths in the past.  One in 2000s showed one ulcerated plaque  Rx medically; [facility] /notes 09/05/2016   CATARACT EXTRACTION W/ INTRAOCULAR LENS  IMPLANT, BILATERAL Bilateral 2000s   COLECTOMY     hx of remote ileum resection due to bleeding   COLONOSCOPY WITH PROPOFOL N/A 01/22/2018   Procedure: COLONOSCOPY  WITH PROPOFOL;  Surgeon: Iva Boop, MD;  Location: [facility] ENDOSCOPY;  Service: Endoscopy;  Laterality: N/A;   CORONARY ANGIOPLASTY WITH STENT PLACEMENT  09/10/2016   CORONARY STENT INTERVENTION N/A 09/10/2016   Procedure: Coronary Stent Intervention;  Surgeon: Kathleene Hazel, MD;  Location: [facility] IN[facility] CV [facility];  Service: Cardiovascular;  Laterality: N/A;  Distal RCA 4.0x16 Synergy   ESOPHAGOGASTRODUODENOSCOPY (EGD) WITH PROPOFOL N/A 01/22/2018   Procedure: ESOPHAGOGASTRODUODENOSCOPY (EGD) WITH PROPOFOL;  Surgeon: Iva Boop, MD;  Location: [facility] ENDOSCOPY;  Service: Endoscopy;  Laterality: N/A;   EYE SURGERY     FALSE ANEURYSM REPAIR Left 08/22/2020   Procedure: EXCLUSION OF LEFT POPLITEAL ARTERY ANEURYSM;  Surgeon: Cephus Shelling, MD;  Location: [facility] OR;  Service: Vascular;  Laterality: Left;   FEMORAL EMBOLOECTOMY Left 07/2000   with left leg ischemia; Dr. Hart Rochester, vascular  FEMORAL-POPLITEAL BYPASS GRAFT Bilateral 08/22/2020   Procedure: LEFT FEMORAL-POSTERIOR TIBIAL ARTERY BYPASS GRAFT USING RIGHT GREATER NONREVERSED SAPHENOUS VEIN GRAFT;  Surgeon: Cephus Shelling, MD;  Location: [facility] OR;  Service: Vascular;  Laterality: Bilateral;   GANGLION CYST EXCISION Right    "wrist"; Dr. Teressa Senter   HOT HEMOSTASIS N/A 01/22/2018   Procedure: HOT HEMOSTASIS (ARGON PLASMA COAGULATION/BICAP);  Surgeon: Iva Boop, MD;  Location: [facility];  Service: Endoscopy;  Laterality: N/A;   LOOP RECORDER INSERTION N/A 04/25/2019   Procedure: LOOP RECORDER INSERTION;  Surgeon: Duke Salvia, MD;  Location: [facility] INVASIVE CV LAB;  Service: Cardiovascular;  Laterality: N/A;   LOOP RECORDER REMOVAL N/A 07/01/2019   Procedure: LOOP RECORDER REMOVAL;  Surgeon: Duke Salvia, MD;  Location: [facility] INVASIVE CV LAB;  Service: Cardiovascular;  Laterality: N/A;   LUMBAR LAMINECTOMY  1972   Dr. Fannie Knee   PACEMAKER IMPLANT N/A 07/01/2019   Procedure: PACEMAKER IMPLANT;  Surgeon: Duke Salvia, MD;  Location: [facility]  INVASIVE CV LAB;  Service: Cardiovascular;  Laterality: N/A;   RIGHT/LEFT HEART CATH AND CORONARY ANGIOGRAPHY N/A 09/10/2016   Procedure: Right/Left Heart Cath and Coronary Angiography;  Surgeon: Kathleene Hazel, MD;  Location: [facility] INVASIVE CV LAB;  Service: Cardiovascular;  Laterality: N/A;   TONSILLECTOMY      Social History   Socioeconomic History   Marital status: Widowed    Spouse name: Not on file   Number of children: Not on file   Years of education: Not on file   Highest education level: Not on file  Occupational History   Occupation: disabled veteran, [employer]    Employer: RETIRED  Tobacco Use   Smoking status: Former    Packs/day: 2.50    Years: 40.00    Pack years: 100.00    Types: Cigarettes, Pipe, Cigars    Quit date: 07/28/1998    Years since quitting: 22.6   Smokeless tobacco: Never  Vaping Use   Vaping Use: Never used  Substance and Sexual Activity   Alcohol use: No    Alcohol/week: 0.0 standard drinks   Drug use: No   Sexual activity: Not Currently  Other Topics Concern   Not on file  Social History Narrative   Lives in the home with his wife   Sees [place] every 6 month for meds.   Denies asbestos exposure   Social Determinants of Corporate investment banker Strain: Not on file  Food Insecurity: Not on file  Transportation Needs: Not on file  Physical Activity: Not on file  Stress: Not on file  Social Connections: Not on file  Intimate Partner Violence: Not on file    Family History  Problem Relation Age of Onset   Other Mother        gun shot    ROS: no fevers or chills, productive cough, hemoptysis, dysphasia, odynophagia, melena, hematochezia, dysuria, hematuria, rash, seizure activity, orthopnea, PND, pedal edema, claudication. Remaining systems are negative.  Physical Exam: Well-developed well-nourished in no acute distress.  Skin is warm and dry.  HEENT is normal.  Neck is supple.  Chest with diminished breath sounds  throughout Cardiovascular exam is regular rate and rhythm.  Abdominal exam nontender or distended. No masses palpated. Extremities show no edema. neuro grossly intact  ECG-sinus rhythm with PACs, first-degree AV block, left bundle branch block.  Personally reviewed  A/P  1 coronary artery disease-patient denies chest pain.  Continue medical therapy.  We will continue statin.  He  is not on aspirin given history of ITP and hemoptysis.  2 paroxysmal atrial fibrillation-he remains in sinus rhythm today.  Apixaban is on hold given history of ITP and hemoptysis.  3 hypertension-patient's blood pressure is controlled.  Continue present medications and follow.  Check potassium and renal function.  4 hyperlipidemia-continue statin.  Check lipids and liver.  5 history of cardiomyopathy-LV function has not improved on most recent study.  6 chronic combined systolic/diastolic congestive heart failure-he appears to be euvolemic.  Continue Lasix as needed.  7 thoracic/abdominal/iliac aneurysms-Now followed by vascular surgery  8 COPD-Per pulmonary.  9 ITP-followed by hematology.  Most recent platelet count 68,000.  Continue prednisone.  10 history of pacemaker-Per electrophysiology.  Olga Millers, MD

## 2021-02-25 NOTE — Telephone Encounter (Signed)
Prescription refill request for Eliquis received. Indication:AFIB Last office visit: BERRY 08/07/20 Scr:1.31 11/14/20 Age: 9M Weight:96.6KG

## 2021-03-01 ENCOUNTER — Ambulatory Visit: Payer: Medicare PPO | Admitting: Adult Health

## 2021-03-01 ENCOUNTER — Encounter: Payer: Self-pay | Admitting: Adult Health

## 2021-03-01 ENCOUNTER — Other Ambulatory Visit: Payer: Self-pay

## 2021-03-01 ENCOUNTER — Ambulatory Visit (INDEPENDENT_AMBULATORY_CARE_PROVIDER_SITE_OTHER): Payer: Medicare PPO

## 2021-03-01 DIAGNOSIS — J449 Chronic obstructive pulmonary disease, unspecified: Secondary | ICD-10-CM | POA: Diagnosis not present

## 2021-03-01 DIAGNOSIS — J189 Pneumonia, unspecified organism: Secondary | ICD-10-CM

## 2021-03-01 DIAGNOSIS — J9611 Chronic respiratory failure with hypoxia: Secondary | ICD-10-CM | POA: Diagnosis not present

## 2021-03-01 DIAGNOSIS — G4733 Obstructive sleep apnea (adult) (pediatric): Secondary | ICD-10-CM

## 2021-03-01 DIAGNOSIS — R5381 Other malaise: Secondary | ICD-10-CM

## 2021-03-01 DIAGNOSIS — I5022 Chronic systolic (congestive) heart failure: Secondary | ICD-10-CM | POA: Diagnosis not present

## 2021-03-01 DIAGNOSIS — Z9989 Dependence on other enabling machines and devices: Secondary | ICD-10-CM

## 2021-03-01 DIAGNOSIS — D696 Thrombocytopenia, unspecified: Secondary | ICD-10-CM

## 2021-03-01 DIAGNOSIS — J439 Emphysema, unspecified: Secondary | ICD-10-CM | POA: Diagnosis not present

## 2021-03-01 MED ORDER — STIOLTO RESPIMAT 2.5-2.5 MCG/ACT IN AERS: 2.0000 | INHALATION_SPRAY | Freq: Every day | RESPIRATORY_TRACT | 0 refills | Status: DC

## 2021-03-01 NOTE — Assessment & Plan Note (Signed)
Continue on current regimen.  And follow-up with hematology

## 2021-03-01 NOTE — Patient Instructions (Signed)
Continue on Stiolto 2 puffs daily .  Mucinex DM Twice daily  As needed  Cough/congestion .  Continue on Oxygen 2l/m  Continue on CPAP At bedtime  With oxygen . Wear with Naps.  Activity as tolerated.  Follow up in 3-4 Months  with Dr. Vassie Loll or Melisa Donofrio NP and As needed   Please contact office for sooner follow up if symptoms do not improve or worsen or seek emergency care

## 2021-03-01 NOTE — Progress Notes (Signed)
@Patient  ID: [name], male    DOB: 1941-07-09, 80 y.o.   MRN: [id number]  Chief Complaint  Patient presents with   Follow-up    Referring provider: Myrlene Broker, *  HPI: 80 year old male former smoker followed for very severe COPD, chronic hypoxic respiratory failure on oxygen at 2 L, obstructive sleep apnea on nocturnal CPAP with oxygen.  He has underlying lung nodules followed on serial CT chest Medical history is significant for rheumatoid arthritis not on maintenance medications, diabetes, congestive heart failure, coronary artery disease status post RCA stent, chronic kidney disease, A. fib with previous use of amiodarone stopped in August 2019.  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 PFT 10/2015 FEV1 was 36%, ratio 46, FVC 58%, DLCO 26%     CT chest 12/2016 -masslike consolidation in LUL   PET scan 03/20/17 >+metabolic act in LUL consolidation    CT chest 08/2017 >> 2.4 x 3.5 cm triangular subpleural opacity in the anterior left upper lobe > has resolved the platelike scarring bilateral nodular infiltrates stable,   CT chest 09/2020 severe emphysema, extensive  pleural calcification.  Previous nodule in the right lower lobe has significantly decreased in size measuring 0.8 x 0.8 cm previously at 2.3 cm.  A stable left upper lobe cavitary nodule measuring 4 mm.  And a stable irregular nodule in the left lower lobe measuring 1.5 cm, and a left lower lobe nodule measuring 1.5 cm  03/01/2021 Follow up ; COPD , OSA , O2 RF , Pneumonia  Patient returns for a 6-week follow-up.  Patient has underlying severe COPD that is oxygen dependent.  Was treated for a COPD exacerbation with pneumonia May 2022.  Chest x-ray showed increased patchy right lower lobe and upper lobe opacities consistent with acute pneumonia.  He was treated with antibiotics. Chest x-ray last visit showed decreased patchy opacities.  Since last visit patient says he is doing better.  He is back to his baseline.  He denies any increased cough congestion.  No hemoptysis.  Lower extremity swelling is much improved.  He is wearing TED hose.  He had some lower extremity wounds that were having trouble to heal but those are finally improving.  He is been following up with the wound center. Patient remains on Stiolto.  Says that he has not gotten approval through the [place] system for this as of yet.  He has used a sample and is waiting on approval from his [place] physician. He remains on oxygen 2 L.  Denies any increased oxygen demands. As above patient has underlying lung nodules.  CT chest in March showed decreased right lower lobe lung nodule.  Plans to repeat  CT in March 2023. Patient said appetite is fair.  He is trying to get out more with his daughter.  He is trying to walk at the grocery store.  He remains on CPAP at bedtime with oxygen.  He says he is had no issues with that.  Feels rested with no significant daytime sleepiness.    No Known Allergies  Immunization History  Administered Date(s) Administered   Fluad Quad(high Dose 65+) 04/26/2019, 06/08/2020   Influenza Split 04/14/2011, 04/27/2013,  03/28/2014   Influenza Whole 05/28/2009, 04/27/2010, 03/31/2012   Influenza, High Dose Seasonal PF 04/10/2010, 04/11/2011, 04/19/2013, 03/21/2015, 04/05/2016, 05/16/2017, 03/28/2018, 04/08/2018   Influenza,inj,Quad PF,6+ Mos 03/17/2014, 04/04/2016   Influenza-Unspecified 04/30/2006, 04/28/2007, 04/28/2008, 05/29/2009, 04/10/2010, 05/26/2011, 05/07/2012, 05/28/2020   PFIZER(Purple Top)SARS-COV-2 Vaccination 08/19/2019, 09/09/2019, 04/08/2020, 12/09/2020   Pneumococcal Conjugate-13 09/20/2013, 03/29/2015   Pneumococcal Polysaccharide-23 05/28/2008, 04/25/2010, 04/19/2013   Pneumococcal-Unspecified 06/26/2006, 04/27/2013   Td 11/23/2009   Tdap 07/17/2011   Zoster, Live 04/28/2009, 11/08/2009    Past Medical History:  Diagnosis Date   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   ablated   Anxiety    Aortic aneurysm (HCC) 09/02/2017   BENIGN PROSTATIC HYPERTROPHY 11/23/2009   Cardiomyopathy (HCC) 08/28/2016   Chronic systolic CHF (congestive heart failure) (HCC) 09/02/2017   COPD (chronic obstructive pulmonary disease) (HCC)    CORONARY ARTERY DISEASE 11/23/2009   DECREASED HEARING, 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   DEGENERATIVE JOINT DISEASE 11/23/2009   DEPRESSION 11/23/2009   FATIGUE 11/23/2009   GAIT DISTURBANCE 12/10/2009   HEMOPTYSIS UNSPECIFIED 05/07/2010   High cholesterol    HYPERTENSION 07/30/2009   HYPOTHYROIDISM 07/30/2009   Ischemic cardiomyopathy 09/02/2017   LUMBAR RADICULOPATHY, 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   On home oxygen therapy    "2-3L; 24/7" (09/10/2016)   OSA on CPAP    Pneumonia    PTSD (post-traumatic stress disorder) 03/10/2012   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   RA (rheumatoid arthritis) (HCC) 06/11/2011   "qwhere" (09/10/2016)   RESPIRATORY FAILURE, CHRONIC 07/31/2009   Scleritis of both eyes 03/17/2014   Thrombocytopenia (HCC)    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   Type II diabetes mellitus (HCC)     Tobacco History: Social History   Tobacco Use  Smoking Status Former   Packs/day: 2.50   Years: 40.00   Pack years:  100.00   Types: Cigarettes, Pipe, Cigars   Quit date: 07/28/1998   Years since quitting: 22.6  Smokeless Tobacco Never   Counseling given: Not Answered   Outpatient Medications Prior to Visit  Medication Sig Dispense Refill   albuterol (PROVENTIL) (2.5 MG/3ML) 0.083% nebulizer solution Take 3 mLs (2.5 mg total) by nebulization every 6 (six) hours as needed for wheezing or shortness of breath. 120 mL 12   albuterol (VENTOLIN HFA) 108 (90 Base) MCG/ACT inhaler Inhale 2 puffs into the lungs every 6 (six) hours as needed for wheezing or shortness of breath. 8 g 6   atorvastatin (LIPITOR) 40 MG tablet Take 1 tablet (40 mg total) by mouth daily. 90 tablet 3   Carboxymethylcellul-Glycerin (LUBRICATING EYE DROPS OP) Place 1 drop into both eyes 4 (four) times daily as needed (dry eyes).      Cholecalciferol (VITAMIN D3) 2000 units capsule Take 2,000 Units by mouth daily.      empagliflozin (JARDIANCE) 10 MG TABS tablet Take 5 mg by mouth daily. Filled at [facility].     furosemide (LASIX) 40 MG tablet Take 1 tablet (40 mg total) by mouth daily. (Patient taking differently: Take 40 mg by mouth daily as needed (fluid retention.).)  90 tablet 3   gabapentin (NEURONTIN) 100 MG capsule Take 1 capsule (100 mg total) by mouth 2 (two) times daily. 90 capsule 0   insulin glargine (LANTUS) 100 UNIT/ML injection Inject 60 Units into the skin at bedtime.      levothyroxine (SYNTHROID) 175 MCG tablet Take 175 mcg by mouth daily before breakfast.     losartan (COZAAR) 50 MG tablet Take 1 tablet (50 mg total) by mouth daily. 90 tablet 3   Menthol, Topical Analgesic, (BIOFREEZE EX) Apply 1 application topically 3 (three) times daily as needed (pain. (calf muscle)).     metFORMIN (GLUCOPHAGE) 500 MG tablet Take 500-1,000 mg by mouth 2 (two) times daily with a meal. Take 1 tablet (500 mg) by mouth in the morning & take 2 tablets (1000 mg) by mouth in the evening.     Multiple Vitamins-Minerals (MEGA MULTIVITAMIN FOR MEN PO) Take  1 tablet by mouth daily.     OXYGEN Inhale 2 L into the lungs continuous.     predniSONE (DELTASONE) 5 MG tablet Take 1 tablet (5 mg total) by mouth daily with breakfast. 90 tablet 3   Semaglutide (OZEMPIC, 1 MG/DOSE, [place]) Inject 1 mg into the skin every Friday.     tamsulosin (FLOMAX) 0.4 MG CAPS capsule Take 1 capsule (0.4 mg total) by mouth at bedtime. 30 capsule 0   tiotropium (SPIRIVA HANDIHALER) 18 MCG inhalation capsule Place 18 mcg into inhaler and inhale daily.     zolpidem (AMBIEN) 5 MG tablet Take 1 tablet (5 mg total) by mouth at bedtime as needed for sleep. 30 tablet 0   apixaban (ELIQUIS) 5 MG TABS tablet TAKE 1 TABLET(5 MG) BY MOUTH TWICE DAILY (Patient not taking: Reported on 03/01/2021) 180 tablet 1   levothyroxine (SYNTHROID) 150 MCG tablet Take 150 mcg by mouth daily before breakfast. (Patient not taking: Reported on 03/01/2021)     Tiotropium Bromide-Olodaterol (STIOLTO RESPIMAT) 2.5-2.5 MCG/ACT AERS Inhale 2 puffs into the lungs daily. (Patient not taking: Reported on 03/01/2021) 4 g 0   Tiotropium Bromide-Olodaterol (STIOLTO RESPIMAT) 2.5-2.5 MCG/ACT AERS Inhale 2 puffs into the lungs daily. (Patient not taking: Reported on 03/01/2021) 2 each 0   No facility-administered medications prior to visit.     Review of Systems:   Constitutional:   No  weight loss, night sweats,  Fevers, chills,  +fatigue, or  lassitude.  HEENT:   No headaches,  Difficulty swallowing,  Tooth/dental problems, or  Sore throat,                No sneezing, itching, ear ache, nasal congestion, post nasal drip,   CV:  No chest pain,  Orthopnea, PND, , anasarca, dizziness, palpitations, syncope.   GI  No heartburn, indigestion, abdominal pain, nausea, vomiting, diarrhea, change in bowel habits, loss of appetite, bloody stools.   Resp:   No excess mucus, no productive cough,  No non-productive cough,  No coughing up of blood.  No change in color of mucus.  No wheezing.  No chest wall deformity  Skin: no  rash or lesions.  GU: no dysuria, change in color of urine, no urgency or frequency.  No flank pain, no hematuria   MS:  No joint pain or swelling.  No decreased range of motion.  No back pain.    Physical Exam  BP 124/66 (BP Location: Left Arm, Patient Position: Sitting, Cuff Size: Normal)   Pulse 64   Temp 97.6 F (36.4 C) (Oral)   Ht 6'  3" (1.905 m)   Wt 214 lb 9.6 oz (97.3 kg)   SpO2 97%   BMI 26.82 kg/m   GEN: A/Ox3; pleasant , NAD, well nourished    HEENT:  [place]/AT,  EACs-clear, TMs-wnl, NOSE-clear, THROAT-clear, no lesions, no postnasal drip or exudate noted.   NECK:  Supple w/ fair ROM; no JVD; normal carotid impulses w/o bruits; no thyromegaly or nodules palpated; no lymphadenopathy.    RESP decreased breath sounds in the bases  no accessory muscle use, no dullness to percussion  CARD:  RRR, no m/r/g, tr  peripheral edema, pulses intact, no cyanosis or clubbing.  GI:   Soft & nt; nml bowel sounds; no organomegaly or masses detected.   Musco: Warm bil, no deformities or joint swelling noted.   Neuro: alert, no focal deficits noted.    Skin: Warm, no lesions or rashes    Lab Results:  CBC    Component Value Date/Time   WBC 8.6 11/29/2020 0945   WBC 8.7 11/14/2020 0945   RBC 4.51 11/29/2020 0945   HGB 11.2 (L) 11/29/2020 0945   HGB 12.0 (L) 06/28/2019 1453   HCT 36.7 (L) 11/29/2020 0945   HCT 38.4 06/28/2019 1453   PLT 68 (L) 11/29/2020 0945   PLT 44 (LL) 06/28/2019 1453   MCV 81.4 11/29/2020 0945   MCV 89 06/28/2019 1453   MCH 24.8 (L) 11/29/2020 0945   MCHC 30.5 11/29/2020 0945   RDW 15.4 11/29/2020 0945   RDW 13.8 06/28/2019 1453   LYMPHSABS 1.9 11/29/2020 0945   LYMPHSABS 1.2 12/23/2016 1053   MONOABS 0.8 11/29/2020 0945   EOSABS 0.2 11/29/2020 0945   EOSABS 0.3 12/23/2016 1053   BASOSABS 0.0 11/29/2020 0945   BASOSABS 0.0 12/23/2016 1053    BMET    Component Value Date/Time   NA 141 11/14/2020 0945   NA 136 02/22/2020 1029   K 4.7  11/14/2020 0945   CL 102 11/14/2020 0945   CO2 29 11/14/2020 0945   GLUCOSE 187 (H) 11/14/2020 0945   BUN 22 11/14/2020 0945   BUN 28 (H) 02/22/2020 1029   CREATININE 1.31 11/14/2020 0945   CREATININE 1.17 04/13/2020 0915   CALCIUM 9.5 11/14/2020 0945   GFRNONAA >60 08/26/2020 0314   GFRNONAA >60 07/15/2018 1059   GFRAA 58 (L) 02/22/2020 1029   GFRAA >60 07/15/2018 1059    BNP No results found for: BNP  ProBNP    Component Value Date/Time   PROBNP 188 10/20/2016 1038   PROBNP 59.9 05/04/2011 0517    Imaging: DG Chest 2 View  Result Date: 03/01/2021 CLINICAL DATA:  Pneumonia EXAM: CHEST - 2 VIEW COMPARISON:  Chest radiograph 01/10/2021, CT chest 10/03/2020 FINDINGS: New left chest wall cardiac device is unchanged. The cardiomediastinal silhouette is unchanged. There is a background of marked emphysematous change, similar to the prior study. Patchy and linear opacities throughout the right hemithorax are similar to the prior study and are consistent with extensive scarring and emphysema. The opacity seen on the study of 11/14/2020 has improved. Similar findings are also seen to a lesser degree in the left hemithorax. There is no new opacity. Blunting of the costophrenic angles is unchanged, likely also due to scarring. There is no pneumothorax. The bones are stable. IMPRESSION: Compared to the study of 11/14/2020, the opacity over the right hemithorax has improved. Persistent patchy and reticular opacities throughout both hemi thoraces are consistent with extensive scarring on a background of severe emphysema as seen on the prior chest  CT. Electronically Signed   By: Lesia Hausen MD   On: 03/01/2021 10:50      PFT Results Latest Ref Rng & Units 11/16/2015 12/30/2013  FVC-Pre L 2.96 3.01  FVC-Predicted Pre % 59 59  FVC-Post L 2.89 3.01  FVC-Predicted Post % 58 59  Pre FEV1/FVC % % 43 45  Post FEV1/FCV % % 46 49  FEV1-Pre L 1.28 1.36  FEV1-Predicted Pre % 35 36  FEV1-Post L 1.33 1.48   DLCO uncorrected ml/min/mmHg 9.88 10.13  DLCO UNC% % 26 26  DLCO corrected ml/min/mmHg 9.94 -  DLCO COR %Predicted % 26 -  DLVA Predicted % 40 39  TLC L - 6.31  TLC % Predicted % - 80  RV % Predicted % - 80    No results found for: NITRICOXIDE      Assessment & Plan:   COPD (chronic obstructive pulmonary disease) (HCC) Severe COPD that is oxygen dependent.  Patient had a recent flare in May with associated pneumonia.  Now resolved and back to baseline.  Continue on Stiolto.  Samples were given today.  Advised patient if [facility] does not improve will need to return back to his previous regimen which was Spiriva and Striverdi  Plan  Patient Instructions  Continue on Stiolto 2 puffs daily .  Mucinex DM Twice daily  As needed  Cough/congestion .  Continue on Oxygen 2l/m  Continue on CPAP At bedtime  With oxygen . Wear with Naps.  Activity as tolerated.  Follow up in 3-4 Months  with Dr. Vassie Loll or Stavros Cail NP and As needed   Please contact office for sooner follow up if symptoms do not improve or worsen or seek emergency care       Chronic systolic CHF (congestive heart failure) (HCC) Appears stable.  He appears euvolemic on exam with no evidence of volume overload.  This is the best have seen his lower extremity edema in a while.  Plan  Patient Instructions  Continue on Stiolto 2 puffs daily .  Mucinex DM Twice daily  As needed  Cough/congestion .  Continue on Oxygen 2l/m  Continue on CPAP At bedtime  With oxygen . Wear with Naps.  Activity as tolerated.  Follow up in 3-4 Months  with Dr. Vassie Loll or Cortlan Dolin NP and As needed   Please contact office for sooner follow up if symptoms do not improve or worsen or seek emergency care       Chronic respiratory failure with hypoxia (HCC) Compensated on oxygen 2 L.  Continue on current settings.  O2 saturation goals are greater than 88 to 90%.  Thrombocytopenia (HCC) Continue on current regimen.  And follow-up with  hematology  Physical deconditioning Activity as tolerated  OSA on CPAP Continue on CPAP at bedtime with oxygen  Community acquired pneumonia Community-acquired pneumonia diagnosed in May 2022.  Clinically patient is improved.  Serial x-rays showed improvement.  X-rays pending today.     Rubye Oaks, NP 03/01/2021

## 2021-03-01 NOTE — Assessment & Plan Note (Signed)
Continue on CPAP at bedtime with oxygen 

## 2021-03-01 NOTE — Assessment & Plan Note (Signed)
Community-acquired pneumonia diagnosed in May 2022.  Clinically patient is improved.  Serial x-rays showed improvement.  X-rays pending today.

## 2021-03-01 NOTE — Assessment & Plan Note (Signed)
Appears stable.  He appears euvolemic on exam with no evidence of volume overload.  This is the best have seen his lower extremity edema in a while.  Plan  Patient Instructions  Continue on Stiolto 2 puffs daily .  Mucinex DM Twice daily  As needed  Cough/congestion .  Continue on Oxygen 2l/m  Continue on CPAP At bedtime  With oxygen . Wear with Naps.  Activity as tolerated.  Follow up in 3-4 Months  with Dr. Vassie Loll or Kelijah Towry NP and As needed   Please contact office for sooner follow up if symptoms do not improve or worsen or seek emergency care

## 2021-03-01 NOTE — Assessment & Plan Note (Signed)
Activity as tolerated

## 2021-03-01 NOTE — Assessment & Plan Note (Signed)
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  Patient had a recent flare in May with associated pneumonia.  Now resolved and back to baseline.  Continue on Stiolto.  Samples were given today.  Advised patient if [facility] does not improve will need to return back to his previous regimen which was Spiriva and Striverdi  Plan  Patient Instructions  Continue on Stiolto 2 puffs daily .  Mucinex DM Twice daily  As needed  Cough/congestion .  Continue on Oxygen 2l/m  Continue on CPAP At bedtime  With oxygen . Wear with Naps.  Activity as tolerated.  Follow up in 3-4 Months  with Dr. Vassie Loll or Takari Lundahl NP and As needed   Please contact office for sooner follow up if symptoms do not improve or worsen or seek emergency care

## 2021-03-01 NOTE — Assessment & Plan Note (Signed)
Compensated on oxygen 2 L.  Continue on current settings.  O2 saturation goals are greater than 88 to 90%.

## 2021-03-05 DIAGNOSIS — G4700 Insomnia, unspecified: Secondary | ICD-10-CM | POA: Insufficient documentation

## 2021-03-05 DIAGNOSIS — Z9981 Dependence on supplemental oxygen: Secondary | ICD-10-CM | POA: Insufficient documentation

## 2021-03-06 ENCOUNTER — Other Ambulatory Visit: Payer: Self-pay

## 2021-03-06 ENCOUNTER — Ambulatory Visit: Payer: Medicare PPO | Admitting: Cardiology

## 2021-03-06 ENCOUNTER — Encounter: Payer: Self-pay | Admitting: Cardiology

## 2021-03-06 VITALS — BP 122/56 | HR 65 | Ht 75.0 in | Wt 208.1 lb

## 2021-03-06 DIAGNOSIS — I739 Peripheral vascular disease, unspecified: Secondary | ICD-10-CM | POA: Diagnosis not present

## 2021-03-06 DIAGNOSIS — I255 Ischemic cardiomyopathy: Secondary | ICD-10-CM

## 2021-03-06 DIAGNOSIS — E7800 Pure hypercholesterolemia, unspecified: Secondary | ICD-10-CM | POA: Diagnosis not present

## 2021-03-06 DIAGNOSIS — I480 Paroxysmal atrial fibrillation: Secondary | ICD-10-CM | POA: Diagnosis not present

## 2021-03-06 NOTE — Patient Instructions (Signed)

## 2021-03-07 ENCOUNTER — Ambulatory Visit: Payer: Medicare PPO | Admitting: Internal Medicine

## 2021-03-07 LAB — COMPREHENSIVE METABOLIC PANEL
ALT: 12 IU/L (ref 0–44)
AST: 17 IU/L (ref 0–40)
Albumin/Globulin Ratio: 1.8 (ref 1.2–2.2)
Albumin: 4.2 g/dL (ref 3.7–4.7)
Alkaline Phosphatase: 69 IU/L (ref 44–121)
BUN/Creatinine Ratio: 20 (ref 10–24)
BUN: 29 mg/dL — ABNORMAL HIGH (ref 8–27)
Bilirubin Total: 0.5 mg/dL (ref 0.0–1.2)
CO2: 25 mmol/L (ref 20–29)
Calcium: 9.4 mg/dL (ref 8.6–10.2)
Chloride: 102 mmol/L (ref 96–106)
Creatinine, Ser: 1.44 mg/dL — ABNORMAL HIGH (ref 0.76–1.27)
Globulin, Total: 2.3 g/dL (ref 1.5–4.5)
Glucose: 123 mg/dL — ABNORMAL HIGH (ref 65–99)
Potassium: 4.8 mmol/L (ref 3.5–5.2)
Sodium: 141 mmol/L (ref 134–144)
Total Protein: 6.5 g/dL (ref 6.0–8.5)
eGFR: 49 mL/min/{1.73_m2} — ABNORMAL LOW (ref >59)

## 2021-03-07 LAB — LIPID PANEL
Chol/HDL Ratio: 4 ratio (ref 0.0–5.0)
Cholesterol, Total: 135 mg/dL (ref 100–199)
HDL: 34 mg/dL — ABNORMAL LOW (ref >39)
LDL Chol Calc (NIH): 74 mg/dL (ref 0–99)
Triglycerides: 154 mg/dL — ABNORMAL HIGH (ref 0–149)
VLDL Cholesterol Cal: 27 mg/dL (ref 5–40)

## 2021-03-08 ENCOUNTER — Other Ambulatory Visit: Payer: Self-pay

## 2021-03-08 ENCOUNTER — Ambulatory Visit: Payer: Medicare PPO | Admitting: Internal Medicine

## 2021-03-08 ENCOUNTER — Encounter: Payer: Self-pay | Admitting: Internal Medicine

## 2021-03-08 DIAGNOSIS — E118 Type 2 diabetes mellitus with unspecified complications: Secondary | ICD-10-CM | POA: Diagnosis not present

## 2021-03-08 DIAGNOSIS — Z794 Long term (current) use of insulin: Secondary | ICD-10-CM

## 2021-03-08 DIAGNOSIS — D693 Immune thrombocytopenic purpura: Secondary | ICD-10-CM | POA: Diagnosis not present

## 2021-03-08 NOTE — Progress Notes (Signed)
   Subjective:   Patient ID: [name], male    DOB: 1941-06-04, 80 y.o.   MRN: [id number]  HPI [facility] and [facility] last week, has not been able to get in touch with her since results in. Has been working on diet but was eating a lot of watermelon since spring not thinking about the sugar content. Still having numbness in his legs which is worse lately.   Review of Systems  Constitutional:  Positive for activity change.  HENT: Negative.    Eyes: Negative.   Respiratory:  Positive for shortness of breath. Negative for cough and chest tightness.   Cardiovascular:  Negative for chest pain, palpitations and leg swelling.  Gastrointestinal:  Negative for abdominal distention, abdominal pain, constipation, diarrhea, nausea and vomiting.  Musculoskeletal:  Positive for arthralgias and myalgias.  Skin: Negative.   Neurological:  Positive for numbness.  Psychiatric/Behavioral: Negative.     Objective:  Physical Exam Constitutional:      Appearance: He is well-developed.  HENT:     Head: Normocephalic and atraumatic.  Cardiovascular:     Rate and Rhythm: Normal rate and regular rhythm.  Pulmonary:     Effort: Pulmonary effort is normal. No respiratory distress.     Breath sounds: Normal breath sounds. No wheezing or rales.     Comments: Oxygen in place Abdominal:     General: Bowel sounds are normal. There is no distension.     Palpations: Abdomen is soft.     Tenderness: There is no abdominal tenderness. There is no rebound.  Musculoskeletal:        General: Tenderness present.     Cervical back: Normal range of motion.  Skin:    General: Skin is warm and dry.  Neurological:     Mental Status: He is alert and oriented to person, place, and time. Mental status is at baseline.     Cranial Nerves: Cranial nerve deficit present.     Coordination: Coordination normal.    Vitals:   03/08/21 0852  BP: 124/80  Pulse: (!) 56  Temp: 97.8 F (36.6 C)   TempSrc: Oral  Weight: 215 lb 6.4 oz (97.7 kg)  Height: 6\' 3"  (1.905 m)    This visit occurred during the SARS-CoV-2 public health emergency.  Safety protocols were in place, including screening questions prior to the visit, additional usage of staff PPE, and extensive cleaning of exam room while observing appropriate contact time as indicated for disinfecting solutions.   Assessment & Plan:

## 2021-03-08 NOTE — Assessment & Plan Note (Signed)
Poorly controlled and he states recent HgA1c is around 10 and will likely need medication adjustment. He is contacting his endo provider through [place] about this. He is on chronic steroids for his ITP which makes control much more difficult. We discussed how his worsening numbness could be related to high sugar levels and focusing on control could help. Taking ozempic and metformin and lantus and jardiance.

## 2021-03-08 NOTE — Patient Instructions (Signed)
It is safe to keep taking the tylenol up to 6 pills per day.  Cut out the watermelon to help the sugars.

## 2021-03-08 NOTE — Assessment & Plan Note (Signed)
Stabilized with prednisone 5 mg daily and most recent platelet levels close to 70. Continue follow up with heme/onc.

## 2021-03-11 ENCOUNTER — Encounter: Payer: Self-pay | Admitting: *Deleted

## 2021-03-12 ENCOUNTER — Ambulatory Visit: Payer: Medicare PPO | Admitting: Adult Health

## 2021-03-13 NOTE — Addendum Note (Signed)
Addended by: Dorris Fetch on: 03/13/2021 09:41 AM   Modules accepted: Orders

## 2021-03-16 DIAGNOSIS — J449 Chronic obstructive pulmonary disease, unspecified: Secondary | ICD-10-CM | POA: Diagnosis not present

## 2021-03-22 ENCOUNTER — Telehealth: Payer: Self-pay | Admitting: Hematology and Oncology

## 2021-03-22 ENCOUNTER — Other Ambulatory Visit: Payer: Self-pay | Admitting: *Deleted

## 2021-03-22 DIAGNOSIS — D693 Immune thrombocytopenic purpura: Secondary | ICD-10-CM

## 2021-03-22 NOTE — Telephone Encounter (Signed)
Scheduled appts per 8/26 sch msg. Called pt, no answer. Left msg with appts date and times.

## 2021-03-26 DIAGNOSIS — G4733 Obstructive sleep apnea (adult) (pediatric): Secondary | ICD-10-CM | POA: Diagnosis not present

## 2021-03-26 DIAGNOSIS — J8410 Pulmonary fibrosis, unspecified: Secondary | ICD-10-CM | POA: Diagnosis not present

## 2021-03-26 DIAGNOSIS — J449 Chronic obstructive pulmonary disease, unspecified: Secondary | ICD-10-CM | POA: Diagnosis not present

## 2021-03-26 DIAGNOSIS — E860 Dehydration: Secondary | ICD-10-CM | POA: Diagnosis not present

## 2021-03-27 NOTE — Progress Notes (Signed)
Patient Care Team: Myrlene Broker, MD as PCP - General (Internal Medicine) Ernesto Rutherford, MD as Consulting Physician (Ophthalmology) Kalman Shan, MD as Consulting Physician (Pulmonary Disease) System, Provider Not In as Consulting Physician Casimer Lanius, MD as Consulting Physician (Rheumatology) Oretha Milch, MD as Consulting Physician (Pulmonary Disease) Lewayne Bunting, MD as Consulting Physician (Cardiology)  DIAGNOSIS:    ICD-10-CM   1. 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 He presents to the clinic today for follow-up.  He reports no increased bruising or bleeding.  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  Therefore it gives him issues with balance.  He is able to get around with the help of a cane.  ALLERGIES:  has No Known Allergies.  MEDICATIONS:  Current Outpatient Medications  Medication Sig Dispense Refill   albuterol (PROVENTIL) (2.5 MG/3ML) 0.083% nebulizer solution Take 3 mLs (2.5 mg total) by nebulization every 6 (six) hours as needed for wheezing or shortness of breath. 120 mL 12   albuterol (VENTOLIN HFA) 108 (90 Base) MCG/ACT inhaler Inhale 2 puffs into the lungs every 6 (six) hours as needed for wheezing or shortness of breath. 8 g 6   apixaban (ELIQUIS) 5 MG TABS tablet TAKE 1 TABLET(5 MG) BY MOUTH TWICE DAILY (Patient not taking: Reported on 03/08/2021) 180 tablet 1   atorvastatin (LIPITOR) 40 MG tablet Take 1 tablet (40 mg total) by mouth daily. 90 tablet 3   Carboxymethylcellul-Glycerin (LUBRICATING EYE DROPS OP) Place 1 drop into both eyes 4 (four) times daily as needed (dry eyes).      Cholecalciferol (VITAMIN D3) 2000 units capsule Take 2,000 Units by mouth daily.      empagliflozin (JARDIANCE) 10 MG TABS tablet Take 5 mg by mouth  daily. Filled at [facility].     furosemide (LASIX) 40 MG tablet Take 1 tablet (40 mg total) by mouth daily. (Patient taking differently: Take 40 mg by mouth daily as needed (fluid retention.).) 90 tablet 3   gabapentin (NEURONTIN) 100 MG capsule Take 1 capsule (100 mg total) by mouth 2 (two) times daily. 90 capsule 0   insulin glargine (LANTUS) 100 UNIT/ML injection Inject 45 Units into the skin at bedtime.     levothyroxine (SYNTHROID) 175 MCG tablet Take 175 mcg by mouth daily before breakfast.     losartan (COZAAR) 50 MG tablet Take 1 tablet (50 mg total) by mouth daily. 90 tablet 3   Menthol, Topical Analgesic, (BIOFREEZE EX) Apply 1 application topically 3 (three) times daily as needed (pain. (calf muscle)). (Patient not taking: Reported on 03/08/2021)     metFORMIN (GLUCOPHAGE) 500 MG tablet Take 500-1,000 mg by mouth 2 (two) times daily with a meal. Take 1 tablet (500 mg) by mouth in the morning & take 2 tablets (1000 mg) by mouth in the evening.     Multiple Vitamins-Minerals (MEGA MULTIVITAMIN FOR MEN PO) Take 1 tablet by mouth daily.     OXYGEN Inhale 2 L into the lungs continuous.     predniSONE (DELTASONE) 5 MG tablet Take 1 tablet (5 mg total) by mouth daily with breakfast. 90 tablet 3   Semaglutide (OZEMPIC, 1 MG/DOSE, [place]agland) Inject 1 mg into the skin every Friday.     tamsulosin (FLOMAX) 0.4 MG CAPS capsule  Take 1 capsule (0.4 mg total) by mouth at bedtime. 30 capsule 0   tiotropium (SPIRIVA HANDIHALER) 18 MCG inhalation capsule Place 18 mcg into inhaler and inhale daily. (Patient not taking: Reported on 03/08/2021)     Tiotropium Bromide-Olodaterol (STIOLTO RESPIMAT) 2.5-2.5 MCG/ACT AERS Inhale 2 puffs into the lungs daily. 4 g 0   zolpidem (AMBIEN) 5 MG tablet Take 1 tablet (5 mg total) by mouth at bedtime as needed for sleep. (Patient taking differently: Take 2.5 mg by mouth at bedtime as needed for sleep.) 30 tablet 0   No current facility-administered medications for this visit.     PHYSICAL EXAMINATION: ECOG PERFORMANCE STATUS: 1 - Symptomatic but completely ambulatory  Vitals:   03/28/21 0944  BP: (!) 152/71  Pulse: 60  Resp: 18  Temp: 97.7 F (36.5 C)  SpO2: 96%   Filed Weights   03/28/21 0944  Weight: 215 lb 9.6 oz (97.8 kg)    LABORATORY DATA:  I have reviewed the data as listed CMP Latest Ref Rng & Units 03/06/2021 11/14/2020 08/26/2020  Glucose 65 - 99 mg/dL 762(U) 633(H) 545(G)  BUN 8 - 27 mg/dL 25(W) 22 23  Creatinine 0.76 - 1.27 mg/dL 3.89(H) 7.34 2.87  Sodium 134 - 144 mmol/L 141 141 143  Potassium 3.5 - 5.2 mmol/L 4.8 4.7 4.0  Chloride 96 - 106 mmol/L 102 102 107  CO2 20 - 29 mmol/L 25 29 28   Calcium 8.6 - 10.2 mg/dL 9.4 9.5 8.9  Total Protein 6.0 - 8.5 g/dL 6.5 - -  Total Bilirubin 0.0 - 1.2 mg/dL 0.5 - -  Alkaline Phos 44 - 121 IU/L 69 - -  AST 0 - 40 IU/L 17 - -  ALT 0 - 44 IU/L 12 - -    Lab Results  Component Value Date   WBC 6.3 03/28/2021   HGB 12.1 (L) 03/28/2021   HCT 38.9 (L) 03/28/2021   MCV 84.0 03/28/2021   PLT 54 (L) 03/28/2021   NEUTROABS 3.5 03/28/2021    ASSESSMENT & PLAN:  Acute ITP (HCC) Relapsed acute ITP Prior treatment: Prednisone started 07/05/2019 completed 10/23/2019 Lab review:  02/08/2020: Platelet count 37 with blood with expectoration started on prednisone 02/15/2020: Platelet count 98 03/19/2020: Platelet count: 68 04/16/2020: Platelet count 64 (prednisone dose lowered to 5 mg) 06/25/2020: Platelets 38 07/09/2020: Platelets 73 (continue with 5 mg prednisone) 08/26/2020: Platelets 73 3/22: Platelets 67 (prednisone discontinued) 11/01/2020: Platelets 37 (prednisone 5 mg restarted) 11/29/2020: Platelets 68 03/28/2021: Platelets 54   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  He has a stent plan for the right leg coming up.   Based on my discussions with Dr. 08/29/2020, we decided that he should stay on 5 mg of  prednisone indefinitely. Return to clinic in 6 months with labs and follow-up   No orders of the defined types were placed in this encounter.  The patient has a good understanding of the overall plan. he agrees with it. he will call with any problems that may develop before the next visit here.  Total time spent: 20 mins including face to face time and time spent for planning, charting and coordination of care  Dakota Loll, MD, MPH 03/28/2021  I, 05/28/2021, am acting as scribe for Dr. Alda Ponder.  I have reviewed the above documentation for accuracy and completeness, and I agree with the above.

## 2021-03-28 ENCOUNTER — Inpatient Hospital Stay: Payer: Medicare PPO | Admitting: Hematology and Oncology

## 2021-03-28 ENCOUNTER — Other Ambulatory Visit: Payer: Self-pay | Admitting: Hematology and Oncology

## 2021-03-28 ENCOUNTER — Other Ambulatory Visit: Payer: Self-pay

## 2021-03-28 ENCOUNTER — Inpatient Hospital Stay: Payer: Medicare PPO | Attending: Hematology and Oncology

## 2021-03-28 DIAGNOSIS — Z79899 Other long term (current) drug therapy: Secondary | ICD-10-CM | POA: Insufficient documentation

## 2021-03-28 DIAGNOSIS — Z7901 Long term (current) use of anticoagulants: Secondary | ICD-10-CM | POA: Diagnosis not present

## 2021-03-28 DIAGNOSIS — D693 Immune thrombocytopenic purpura: Secondary | ICD-10-CM | POA: Diagnosis not present

## 2021-03-28 LAB — CBC WITH DIFFERENTIAL (CANCER CENTER ONLY)
Abs Immature Granulocytes: 0.02 10*3/uL (ref 0.00–0.07)
Basophils Absolute: 0.1 10*3/uL (ref 0.0–0.1)
Basophils Relative: 1 %
Eosinophils Absolute: 0.2 10*3/uL (ref 0.0–0.5)
Eosinophils Relative: 3 %
HCT: 38.9 % — ABNORMAL LOW (ref 39.0–52.0)
Hemoglobin: 12.1 g/dL — ABNORMAL LOW (ref 13.0–17.0)
Immature Granulocytes: 0 %
Lymphocytes Relative: 28 %
Lymphs Abs: 1.8 10*3/uL (ref 0.7–4.0)
MCH: 26.1 pg (ref 26.0–34.0)
MCHC: 31.1 g/dL (ref 30.0–36.0)
MCV: 84 fL (ref 80.0–100.0)
Monocytes Absolute: 0.7 10*3/uL (ref 0.1–1.0)
Monocytes Relative: 12 %
Neutro Abs: 3.5 10*3/uL (ref 1.7–7.7)
Neutrophils Relative %: 56 %
Platelet Count: 54 10*3/uL — ABNORMAL LOW (ref 150–400)
RBC: 4.63 MIL/uL (ref 4.22–5.81)
RDW: 16.9 % — ABNORMAL HIGH (ref 11.5–15.5)
WBC Count: 6.3 10*3/uL (ref 4.0–10.5)
nRBC: 0 % (ref 0.0–0.2)

## 2021-03-28 NOTE — Assessment & Plan Note (Addendum)
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 He has a stent plan for the right leg coming up.   Based on my discussions with Dr. Vassie Loll, we decided that he should stay on 5 mg of prednisone indefinitely.

## 2021-03-29 ENCOUNTER — Ambulatory Visit (INDEPENDENT_AMBULATORY_CARE_PROVIDER_SITE_OTHER): Payer: Medicare PPO

## 2021-03-29 DIAGNOSIS — I255 Ischemic cardiomyopathy: Secondary | ICD-10-CM

## 2021-04-02 LAB — CUP PACEART REMOTE DEVICE CHECK
Battery Remaining Longevity: 150 mo
Battery Voltage: 3.03 V
Brady Statistic AP VP Percent: 37.62 %
Brady Statistic AP VS Percent: 3.25 %
Brady Statistic AS VP Percent: 9.9 %
Brady Statistic AS VS Percent: 49.23 %
Brady Statistic RA Percent Paced: 41.34 %
Brady Statistic RV Percent Paced: 47.52 %
Date Time Interrogation Session: 20220902063212
Implantable Lead Implant Date: 20201204
Implantable Lead Implant Date: 20201204
Implantable Lead Location: 753859
Implantable Lead Location: 753860
Implantable Lead Model: 5076
Implantable Lead Model: 5076
Implantable Pulse Generator Implant Date: 20201204
Lead Channel Impedance Value: 304 Ohm
Lead Channel Impedance Value: 323 Ohm
Lead Channel Impedance Value: 380 Ohm
Lead Channel Impedance Value: 418 Ohm
Lead Channel Pacing Threshold Amplitude: 0.5 V
Lead Channel Pacing Threshold Amplitude: 0.5 V
Lead Channel Pacing Threshold Pulse Width: 0.4 ms
Lead Channel Pacing Threshold Pulse Width: 0.4 ms
Lead Channel Sensing Intrinsic Amplitude: 1.875 mV
Lead Channel Sensing Intrinsic Amplitude: 1.875 mV
Lead Channel Sensing Intrinsic Amplitude: 7.625 mV
Lead Channel Sensing Intrinsic Amplitude: 7.625 mV
Lead Channel Setting Pacing Amplitude: 1.5 V
Lead Channel Setting Pacing Amplitude: 2.5 V
Lead Channel Setting Pacing Pulse Width: 0.4 ms
Lead Channel Setting Sensing Sensitivity: 2 mV

## 2021-04-04 ENCOUNTER — Telehealth: Payer: Self-pay | Admitting: *Deleted

## 2021-04-04 NOTE — Chronic Care Management (AMB) (Signed)
  Chronic Care Management   Outreach Note  04/04/2021 Name: [name] MRN: [id number] DOB: 06-12-1941  [name] is a 80 y.o. year old male who is a primary care patient of [name] Dakota Miles, MD. I reached out to [name] by phone today in response to a referral sent by [name]'s PCP, Dr. Okey Aguilar.       An unsuccessful telephone outreach was attempted today. The patient was referred to the case management team for assistance with care management and care coordination.   Follow Up Plan: A HIPAA compliant phone message was left for the patient providing contact information and requesting a return call. The care management team will reach out to the patient again over the next 7 days.  If patient returns call to provider office, please advise to call Embedded Care Management Care Guide Misty Stanley  at [phone].  Gwenevere Ghazi  Care Guide, Embedded Care Coordination [facility] Management  Direct Dial: [phone]

## 2021-04-05 NOTE — Chronic Care Management (AMB) (Signed)
  Chronic Care Management   Note  04/05/2021 Name: JUDSON TSAN MRN: [id number] DOB: Dec 16, 1940  KHOLTON COATE is a 80 y.o. year old male who is a primary care patient of [name], MD. I reached out to [name] by phone today in response to a referral sent by Mr. Christphor Groft Macdowell's PCP, Dr. Sharlet Salina.       Mr. Bollig was given information about Chronic Care Management services today including:  CCM service includes personalized support from designated clinical staff supervised by his physician, including individualized plan of care and coordination with other care providers 24/7 contact phone numbers for assistance for urgent and routine care needs. Service will only be billed when office clinical staff spend 20 minutes or more in a month to coordinate care. Only one practitioner may furnish and bill the service in a calendar month. The patient may stop CCM services at any time (effective at the end of the month) by phone call to the office staff. The patient will be responsible for cost sharing (co-pay) of up to 20% of the service fee (after annual deductible is met).  Patient agreed to services and verbal consent obtained.   Follow up plan: Telephone appointment with care management team member scheduled for:04/24/21  Justene Jensen  Care Guide, Embedded Care Coordination   Care Management  Direct Dial: [phone]

## 2021-04-10 NOTE — Progress Notes (Signed)
Remote pacemaker transmission.   

## 2021-04-12 ENCOUNTER — Ambulatory Visit: Payer: Medicare PPO | Admitting: Adult Health

## 2021-04-16 DIAGNOSIS — J449 Chronic obstructive pulmonary disease, unspecified: Secondary | ICD-10-CM | POA: Diagnosis not present

## 2021-04-24 ENCOUNTER — Ambulatory Visit (INDEPENDENT_AMBULATORY_CARE_PROVIDER_SITE_OTHER): Payer: Medicare PPO | Admitting: *Deleted

## 2021-04-24 DIAGNOSIS — E118 Type 2 diabetes mellitus with unspecified complications: Secondary | ICD-10-CM

## 2021-04-24 DIAGNOSIS — J449 Chronic obstructive pulmonary disease, unspecified: Secondary | ICD-10-CM

## 2021-04-24 DIAGNOSIS — Z794 Long term (current) use of insulin: Secondary | ICD-10-CM

## 2021-04-24 NOTE — Patient Instructions (Signed)
Visit Information   [name], it was nice talking with you today.    I look forward to talking to you again for an update on Thursday July 25, 2021 at 10:00 am- please be listening out for my call that day.  I will call as close to 10:00 am as possible.   If you need to cancel or re-schedule our telephone visit, please call [phone] and one of our care guides will be happy to assist you.   I look forward to hearing about your progress.   Please don't hesitate to contact me if I can be of assistance to you before our next scheduled telephone appointment.   Oneta Rack, RN, BSN, [facility] RN Care Coordination- [place] [phone]: direct office [phone]: mobile   PATIENT GOALS:   Goals Addressed             This Visit's Progress    Patient Self-Care Activities   On track    Timeframe:  Long-Range Goal Priority:  Medium Start Date:     04/24/21                        Expected End Date: 04/24/22                      Patient will self administer medications as prescribed as evidenced by self report/primary caregiver report  Patient will attend all scheduled provider appointments as evidenced by clinician review of documented attendance to scheduled appointments and patient/caregiver report Patient will call pharmacy for medication refills as evidenced by patient report and review of pharmacy fill history as appropriate Patient will call provider office for new concerns or questions as evidenced by review of documented incoming telephone call notes and patient report Patient will continue to follow heart healthy, low salt, low carbohydrate, low sugar diet Patient will continue to monitor blood sugars at home- twice daily is best: first thing in the morning before eating; and then again 2 hours after a regular meal Patient will discuss obtaining continuous glucose monitor (CGM) with [facility] endocrinologist Patient will remain active and will  continue his established exercise program at home Patient will obtain flu vaccine for 2022-23 flu season          Influenza (Flu) Vaccine (Inactivated or Recombinant): What You Need to Know 1. Why get vaccinated? Influenza vaccine can prevent influenza (flu). Flu is a contagious disease that spreads around the [place] every year, usually between October and May. Anyone can get the flu, but it is more dangerous for some people. Infants and young children, people 67 years and older, pregnant people, and people with certain health conditions or a weakened immune system are at greatest risk of flu complications. Pneumonia, bronchitis, sinus infections, and ear infections are examples of flu-related complications. If you have a medical condition, such as heart disease, cancer, or diabetes, flu can make it worse. Flu can cause fever and chills, sore throat, muscle aches, fatigue, cough, headache, and runny or stuffy nose. Some people may have vomiting and diarrhea, though this is more common in children than adults. In an average year, thousands of people in the [place] die from flu, and many more are hospitalized. Flu vaccine prevents millions of illnesses and flu-related visits to the doctor each year. 2. Influenza vaccines CDC recommends everyone 6 months and older get vaccinated every flu season. Children 6 months through 8 years of  age may need 2 doses during a single flu season. Everyone else needs only 1 dose each flu season. It takes about 2 weeks for protection to develop after vaccination. There are many flu viruses, and they are always changing. Each year a new flu vaccine is made to protect against the influenza viruses believed to be likely to cause disease in the upcoming flu season. Even when the vaccine doesn't exactly match these viruses, it may still provide some protection. Influenza vaccine does not cause flu. Influenza vaccine may be given at the same time as other  vaccines. 3. Talk with your health care provider Tell your vaccination provider if the person getting the vaccine: Has had an allergic reaction after a previous dose of influenza vaccine, or has any severe, life-threatening allergies Has ever had Guillain-Barr Syndrome (also called "GBS") In some cases, your health care provider may decide to postpone influenza vaccination until a future visit. Influenza vaccine can be administered at any time during pregnancy. People who are or will be pregnant during influenza season should receive inactivated influenza vaccine. People with minor illnesses, such as a cold, may be vaccinated. People who are moderately or severely ill should usually wait until they recover before getting influenza vaccine. Your health care provider can give you more information. 4. Risks of a vaccine reaction Soreness, redness, and swelling where the shot is given, fever, muscle aches, and headache can happen after influenza vaccination. There may be a very small increased risk of Guillain-Barr Syndrome (GBS) after inactivated influenza vaccine (the flu shot). Young children who get the flu shot along with pneumococcal vaccine (PCV13) and/or DTaP vaccine at the same time might be slightly more likely to have a seizure caused by fever. Tell your health care provider if a child who is getting flu vaccine has ever had a seizure. People sometimes faint after medical procedures, including vaccination. Tell your provider if you feel dizzy or have vision changes or ringing in the ears. As with any medicine, there is a very remote chance of a vaccine causing a severe allergic reaction, other serious injury, or death. 5. What if there is a serious problem? An allergic reaction could occur after the vaccinated person leaves the clinic. If you see signs of a severe allergic reaction (hives, swelling of the face and throat, difficulty breathing, a fast heartbeat, dizziness, or weakness), call  9-1-1 and get the person to the nearest hospital. For other signs that concern you, call your health care provider. Adverse reactions should be reported to the Vaccine Adverse Event Reporting System (VAERS). Your health care provider will usually file this report, or you can do it yourself. Visit the VAERS website at www.vaers.SamedayNews.es or call 4133460663. VAERS is only for reporting reactions, and VAERS staff members do not give medical advice. 6. The National Vaccine Injury Compensation Program The [employer] Vaccine Injury Compensation Program (VICP) is a federal program that was created to compensate people who may have been injured by certain vaccines. Claims regarding alleged injury or death due to vaccination have a time limit for filing, which may be as short as two years. Visit the VICP website at GoldCloset.com.ee or call 828-140-7047 to learn about the program and about filing a claim. 7. How can I learn more? Ask your health care provider. Call your local or state health department. Visit the website of the Food and Drug Administration (FDA) for vaccine package inserts and additional information at TraderRating.uy. Contact the Centers for Disease Control and Prevention (CDC):  Call 628-154-9880 (1-800-CDC-INFO) or Visit CDC's website at https://gibson.com/. Vaccine Information Statement Inactivated Influenza Vaccine (03/02/2020) This information is not intended to replace advice given to you by your health care provider. Make sure you discuss any questions you have with your health care provider. Document Revised: 04/19/2020 Document Reviewed: 04/19/2020 Elsevier Patient Education  [place].  COPD Action Plan A COPD action plan is a description of what to do when you have a flare (exacerbation) of chronic obstructive pulmonary disease (COPD). Your action plan is a color-coded plan that lists the symptoms that indicate whether your  condition is under control and what actions to take. If you have symptoms in the green zone, it means you are doing well that day. If you have symptoms in the yellow zone, it means you are having a bad day or an exacerbation. If you have symptoms in the red zone, you need urgent medical care. Follow the plan that you and your health care provider developed. Review your plan with your health care provider at each visit. Red zone Symptoms in this zone mean that you should get medical help right away. They include: Feeling very short of breath, even when you are resting. Not being able to do any activities because of poor breathing. Not being able to sleep because of poor breathing. Fever or shaking chills. Feeling confused or very sleepy. Chest pain. Coughing up blood. If you have any of these symptoms, call emergency services (911 in the U.S[place] or go to the nearest emergency room. Yellow zone Symptoms in this zone mean that your condition may be getting worse. They include: Feeling more short of breath than usual. Having less energy for daily activities than usual. Phlegm or mucus that is thicker than usual. Needing to use your rescue inhaler or nebulizer more often than usual. More ankle swelling than usual. Coughing more than usual. Feeling like you have a chest cold. Trouble sleeping due to COPD symptoms. Decreased appetite. COPD medicines not helping as much as usual. If you experience any "yellow" symptoms: Keep taking your daily medicines as directed. Use your quick-relief inhaler as told by your health care provider. If you were prescribed steroid medicine to take by mouth (oral medicine), start taking it as told by your health care provider. If you were prescribed an antibiotic medicine, start taking it as told by your health care provider. Do not stop taking the antibiotic even if you start to feel better. Use oxygen as told by your health care provider. Get more rest. Do  your pursed-lip breathing exercises. Do not smoke. Avoid any irritants in the air. If your signs and symptoms do not improve after taking these steps, call your health care provider right away. Green zone Symptoms in this zone mean that you are doing well. They include: Being able to do your usual activities and exercise. Having the usual amount of coughing, including the same amount of phlegm or mucus. Being able to sleep well. Having a good appetite. Where to find more information: You can find more information about COPD from: American Lung Association, My COPD Action Plan: www.lung.org COPD Foundation: www.copdfoundation.[place]: https://wilson-eaton.com/ Follow these instructions at home: Continue taking your daily medicines as told by your health care provider. Make sure you receive all the immunizations that your health care provider recommends, especially the pneumococcal and influenza vaccines. Wash your hands often with soap and water. Have family members wash their hands too. Regular hand washing can help  prevent infections. Follow your usual exercise and diet plan. Avoid irritants in the air, such as smoke. Do not use any products that contain nicotine or tobacco. These products include cigarettes, chewing tobacco, and vaping devices, such as e-cigarettes. If you need help quitting, ask your health care provider. Summary A COPD action plan tells you what to do when you have a flare (exacerbation) of chronic obstructive pulmonary disease (COPD). Follow each action plan for your symptoms. If you have any symptoms in the red zone, call emergency services (911 in the [place] or go to the nearest emergency room. This information is not intended to replace advice given to you by your health care provider. Make sure you discuss any questions you have with your health care provider. Document Revised: 05/22/2020 Document Reviewed: 05/22/2020 Elsevier Patient  Education  2022 [place].  Diabetes Mellitus and Nutrition, Adult When you have diabetes, or diabetes mellitus, it is very important to have healthy eating habits because your blood sugar (glucose) levels are greatly affected by what you eat and drink. Eating healthy foods in the right amounts, at about the same times every day, can help you: Control your blood glucose. Lower your risk of heart disease. Improve your blood pressure. Reach or maintain a healthy weight. What can affect my meal plan? Every person with diabetes is different, and each person has different needs for a meal plan. Your health care provider may recommend that you work with a dietitian to make a meal plan that is best for you. Your meal plan may vary depending on factors such as: The calories you need. The medicines you take. Your weight. Your blood glucose, blood pressure, and cholesterol levels. Your activity level. Other health conditions you have, such as heart or kidney disease. How do carbohydrates affect me? Carbohydrates, also called carbs, affect your blood glucose level more than any other type of food. Eating carbs naturally raises the amount of glucose in your blood. Carb counting is a method for keeping track of how many carbs you eat. Counting carbs is important to keep your blood glucose at a healthy level, especially if you use insulin or take certain oral diabetes medicines. It is important to know how many carbs you can safely have in each meal. This is different for every person. Your dietitian can help you calculate how many carbs you should have at each meal and for each snack. How does alcohol affect me? Alcohol can cause a sudden decrease in blood glucose (hypoglycemia), especially if you use insulin or take certain oral diabetes medicines. Hypoglycemia can be a life-threatening condition. Symptoms of hypoglycemia, such as sleepiness, dizziness, and confusion, are similar to symptoms of having  too much alcohol. Do not drink alcohol if: Your health care provider tells you not to drink. You are pregnant, may be pregnant, or are planning to become pregnant. If you drink alcohol: Do not drink on an empty stomach. Limit how much you use to: 0-1 drink a day for women. 0-2 drinks a day for men. Be aware of how much alcohol is in your drink. In the [place] one drink equals one 12 oz bottle of beer (355 mL), one 5 oz glass of wine (148 mL), or one 1 oz glass of hard liquor (44 mL). Keep yourself hydrated with water, diet soda, or unsweetened iced tea. Keep in mind that regular soda, juice, and other mixers may contain a lot of sugar and must be counted as carbs. What are tips for following  this plan? Reading food labels Start by checking the serving size on the "Nutrition Facts" label of packaged foods and drinks. The amount of calories, carbs, fats, and other nutrients listed on the label is based on one serving of the item. Many items contain more than one serving per package. Check the total grams (g) of carbs in one serving. You can calculate the number of servings of carbs in one serving by dividing the total carbs by 15. For example, if a food has 30 g of total carbs per serving, it would be equal to 2 servings of carbs. Check the number of grams (g) of saturated fats and trans fats in one serving. Choose foods that have a low amount or none of these fats. Check the number of milligrams (mg) of salt (sodium) in one serving. Most people should limit total sodium intake to less than 2,300 mg per day. Always check the nutrition information of foods labeled as "low-fat" or "nonfat." These foods may be higher in added sugar or refined carbs and should be avoided. Talk to your dietitian to identify your daily goals for nutrients listed on the label. Shopping Avoid buying canned, pre-made, or processed foods. These foods tend to be high in fat, sodium, and added sugar. Shop around the outside  edge of the grocery store. This is where you will most often find fresh fruits and vegetables, bulk grains, fresh meats, and fresh dairy. Cooking Use low-heat cooking methods, such as baking, instead of high-heat cooking methods like deep frying. Cook using healthy oils, such as olive, canola, or sunflower oil. Avoid cooking with butter, cream, or high-fat meats. Meal planning Eat meals and snacks regularly, preferably at the same times every day. Avoid going long periods of time without eating. Eat foods that are high in fiber, such as fresh fruits, vegetables, beans, and whole grains. Talk with your dietitian about how many servings of carbs you can eat at each meal. Eat 4-6 oz (112-168 g) of lean protein each day, such as lean meat, chicken, fish, eggs, or tofu. One ounce (oz) of lean protein is equal to: 1 oz (28 g) of meat, chicken, or fish. 1 egg.  cup (62 g) of tofu. Eat some foods each day that contain healthy fats, such as avocado, nuts, seeds, and fish. What foods should I eat? Fruits Berries. Apples. Oranges. Peaches. Apricots. Plums. Grapes. Mango. Papaya. Pomegranate. Kiwi. Cherries. Vegetables Lettuce. Spinach. Leafy greens, including kale, chard, collard greens, and mustard greens. Beets. Cauliflower. Cabbage. Broccoli. Carrots. Green beans. Tomatoes. Peppers. Onions. Cucumbers. Brussels sprouts. Grains Whole grains, such as whole-wheat or whole-grain bread, crackers, tortillas, cereal, and pasta. Unsweetened oatmeal. Quinoa. Brown or wild rice. Meats and other proteins Seafood. Poultry without skin. Lean cuts of poultry and beef. Tofu. Nuts. Seeds. Dairy Low-fat or fat-free dairy products such as milk, yogurt, and cheese. The items listed above may not be a complete list of foods and beverages you can eat. Contact a dietitian for more information. What foods should I avoid? Fruits Fruits canned with syrup. Vegetables Canned vegetables. Frozen vegetables with butter or  cream sauce. Grains Refined white flour and flour products such as bread, pasta, snack foods, and cereals. Avoid all processed foods. Meats and other proteins Fatty cuts of meat. Poultry with skin. Breaded or fried meats. Processed meat. Avoid saturated fats. Dairy Full-fat yogurt, cheese, or milk. Beverages Sweetened drinks, such as soda or iced tea. The items listed above may not be a complete list of foods  and beverages you should avoid. Contact a dietitian for more information. Questions to ask a health care provider Do I need to meet with a diabetes educator? Do I need to meet with a dietitian? What number can I call if I have questions? When are the best times to check my blood glucose? Where to find more information: American Diabetes Association: diabetes.org Academy of Nutrition and Dietetics: www.eatright.[employer] of Diabetes and Digestive and Kidney Diseases: DesMoinesFuneral.dk Association of Diabetes Care and Education Specialists: www.diabeteseducator.org Summary It is important to have healthy eating habits because your blood sugar (glucose) levels are greatly affected by what you eat and drink. A healthy meal plan will help you control your blood glucose and maintain a healthy lifestyle. Your health care provider may recommend that you work with a dietitian to make a meal plan that is best for you. Keep in mind that carbohydrates (carbs) and alcohol have immediate effects on your blood glucose levels. It is important to count carbs and to use alcohol carefully. This information is not intended to replace advice given to you by your health care provider. Make sure you discuss any questions you have with your health care provider. Document Revised: 06/21/2019 Document Reviewed: 06/21/2019 Elsevier Patient Education  2021 [place].   Consent to CCM Services: Mr. Signor was given information about Chronic Care Management services including:  CCM service  includes personalized support from designated clinical staff supervised by his physician, including individualized plan of care and coordination with other care providers 24/7 contact phone numbers for assistance for urgent and routine care needs. Service will only be billed when office clinical staff spend 20 minutes or more in a month to coordinate care. Only one practitioner may furnish and bill the service in a calendar month. The patient may stop CCM services at any time (effective at the end of the month) by phone call to the office staff. The patient will be responsible for cost sharing (co-pay) of up to 20% of the service fee (after annual deductible is met).  Patient agreed to services and verbal consent obtained.   Patient verbalizes understanding of instructions provided today and agrees to view in [place] Telephone follow up appointment with care management team member scheduled for:  Thursday July 25, 2021 at 10:00 am The patient has been provided with contact information for the care management team and has been advised to call with any health related questions or concerns  Oneta Rack, RN, BSN, [place] [phone]: direct office [phone]: mobile    CLINICAL CARE PLAN: Patient Care Plan: RN Care Manager Plan of Care     Problem Identified: Chronic Disease Management Needs   Priority: Medium     Long-Range Goal: Development of plan of care for long term chronic disease management   Start Date: 04/24/2021  Expected End Date: 04/24/2022  Priority: Medium  Note:   Current Barriers:  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   Interventions: 1:1  collaboration with primary care provider regarding development and update of comprehensive plan of care as evidenced by provider attestation and co-signature Inter-disciplinary care team collaboration (see longitudinal plan of care) Evaluation of current treatment plan related to  self management and patient's adherence to  plan as established by provider  COPD: (Status: New goal.) Reviewed medications with patient, including use of prescribed maintenance and rescue inhalers, and provided instruction on medication management and the importance of adherence Provided patient with basic written and verbal COPD education on self care/management/and exacerbation prevention; Advised patient to track and manage COPD triggers;  Provided instruction about proper use of medications used for management of COPD including inhalers; Advised patient to self assesses COPD action plan zone and make appointment with provider if in the yellow zone for 48 hours without improvement; Advised patient to engage in light exercise as tolerated 3-5 days a week to aid in the the management of COPD; Provided education about and advised patient to utilize infection prevention strategies to reduce risk of respiratory infection; Discussed the importance of adequate rest and management of fatigue with COPD; Assessed social determinant of health barriers;  Discussed currently used medications for COPD: confirmed patient has good understanding of difference between maintenance and rescue inhalers: reports rarely needs to use rescue inhaler; reports has not used nebulizer in a very long time: encouraged him to have DME company service nebulizer should he need in future: he will consider Confirmed patient uses continuous home O2 at 2-3 L/min, has portable helios tank; uses CPAP q HS Discussed action plan for COPD flares: patient has good baseline understanding of same; managing COPD well at home on daily basis Discussed need for  upcoming flu vaccine for 2022-23 flu season: we also discussed timing of next COVID vaccine- he has had 2 boosters, last one "about 7 months ago;" discussed newly available COVID vaccine to cover variants; patient is agreeable to speak with his outpatient pharmacist about timing around flu vaccine Confirmed patient is active and independent and regularly exercises- positive reinforcement provided; benefits of maintaining activity and exercise routine over time discussed  Confirmed patient follows low salt diet- positive reinforcement provided  Diabetes:  (Status: New goal.) Lab Results  Component Value Date   HGBA1C 10.3 (H) 08/21/2020  Assessed patient's understanding of A1c goal: <7% Provided education to patient about basic DM disease process; Reviewed medications with patient and discussed importance of medication adherence;        Reviewed prescribed diet with patient low sugar/ carbohydrate, low salt, heart healthy; Counseled on importance of regular laboratory monitoring as prescribed;        Discussed plans with patient for ongoing care management follow up and provided patient with direct contact information for care management team;      Reviewed scheduled/upcoming provider appointments including: 05/20/21- [place] provider for updated A1-C 06/06/21- pulmonary provider ([place]);         Review of patient status, including review of consultants reports, relevant laboratory and other test results, and medications completed;       Advised patient to discuss desire to obtain CGM with provider;      Discussed with patient his current home blood sugar monitoring routine: reports he "checks as often as he can" around ongoing issues obtaining blood from fingers with sticks- states his fingers are sore and do not give him much blood; tries to check QD, however sometimes misses checking Reviewed most recent A1-C level of "10.0- 10.5" with patient: he reports eating a lot of watermelon at  the time of last A1-C; confirms that he has made dietary changes accordingly and has seen a decrease in his home blood sugar values recently; states he has never had an A1-C less than "high 7-8" and his personal goal is <  8.0 for his age Confirmed patient has discussed with [facility] end[facility] about obtaining CGM- he reports he was told he does not currently qualify- he is considering asking provider if he could obtain if he paid out of pocket- this was encouraged; discussed ability of patient to be more empowered with blood sugar management at home if he knows his daily blood sugars- patient is aware and agrees, but admits he is challenged in performing finger sticks- encouraged him to continue checking as often as he is able- he is agreeable Reviewed medications pertinent for COPD/ DMII with patient: he declines full medication review today, is not near his medications; independently manages medications, uses am/pm pill box, verbalizes a very good understanding of the purpose/ dosing/ scheduling of medications Discussed/ provided verbal education around benefits of exercise in setting of DMII/ COPD- positive reinforcement provided for patient staying regularly active/ following exercise routine at home   Patient Goals/Self-Care Activities: Patient will self administer medications as prescribed as evidenced by self report/primary caregiver report  Patient will attend all scheduled provider appointments as evidenced by clinician review of documented attendance to scheduled appointments and patient/caregiver report Patient will call pharmacy for medication refills as evidenced by patient report and review of pharmacy fill history as appropriate Patient will call provider office for new concerns or questions as evidenced by review of documented incoming telephone call notes and patient report Patient will continue to follow heart healthy, low salt, low carbohydrate, low sugar diet Patient will continue to  monitor blood sugars at home- twice daily is best: first thing in the morning before eating; and then again 2 hours after a regular meal Patient will discuss obtaining continuous glucose monitor (CGM) with [facility] endocrinologist Patient will remain active and will continue his established exercise program at home Patient will obtain flu vaccine for 2022-23 flu season

## 2021-04-24 NOTE — Chronic Care Management (AMB) (Cosign Needed)
Chronic Care Management   CCM RN Visit Note  04/24/2021 Name: [name] MRN: [id number] DOB: May 02, 1941  Subjective: [name] is a 80 y.o. year old male who is a primary care patient of Myrlene Broker, MD. The care management team was consulted for assistance with disease management and care coordination needs.    Engaged with patient by telephone for initial visit in response to provider referral for case management and/or care coordination services.   Consent to Services:  The patient was given information about Chronic Care Management services, agreed to services, and gave verbal consent 04/05/21 prior to initiation of services.  Please see initial visit note for detailed documentation.  Patient agreed to services and verbal consent obtained.   Assessment: Review of patient past medical history, allergies, medications, health status, including review of consultants reports, laboratory and other test data, was performed as part of comprehensive evaluation and provision of chronic care management services.   SDOH (Social Determinants of Health) assessments and interventions performed:  SDOH Interventions    Flowsheet Row Most Recent Value  SDOH Interventions   Food Insecurity Interventions Intervention Not Indicated  Housing Interventions Intervention Not Indicated  Transportation Interventions Intervention Not Indicated  [Patient drives self]       CCM Care Plan No Known Allergies  Outpatient Encounter Medications as of 04/24/2021  Medication Sig Note   albuterol (VENTOLIN HFA) 108 (90 Base) MCG/ACT inhaler Inhale 2 puffs into the lungs every 6 (six) hours as needed for wheezing or shortness of breath. 04/24/2021: 04/24/21- reports uses only occasionally   atorvastatin (LIPITOR) 40 MG tablet Take 1 tablet (40 mg total) by mouth daily.    empagliflozin (JARDIANCE) 10 MG TABS tablet Take 5 mg by mouth daily. Filled at [facility].    insulin glargine (LANTUS) 100  UNIT/ML injection Inject 45 Units into the skin at bedtime.    metFORMIN (GLUCOPHAGE) 500 MG tablet Take 500-1,000 mg by mouth 2 (two) times daily with a meal. Take 1 tablet (500 mg) by mouth in the morning & take 2 tablets (1000 mg) by mouth in the evening.    OXYGEN Inhale 2 L into the lungs continuous.    Semaglutide (OZEMPIC, 1 MG/DOSE, [place]) Inject 1 mg into the skin every Friday.    Tiotropium Bromide-Olodaterol (STIOLTO RESPIMAT) 2.5-2.5 MCG/ACT AERS Inhale 2 puffs into the lungs daily.    albuterol (PROVENTIL) (2.5 MG/3ML) 0.083% nebulizer solution Take 3 mLs (2.5 mg total) by nebulization every 6 (six) hours as needed for wheezing or shortness of breath. (Patient not taking: Reported on 04/24/2021) 04/24/2021: 04/24/21-- reports has not needed in several years   Carboxymethylcellul-Glycerin (LUBRICATING EYE DROPS OP) Place 1 drop into both eyes 4 (four) times daily as needed (dry eyes).     Cholecalciferol (VITAMIN D3) 2000 units capsule Take 2,000 Units by mouth daily.     furosemide (LASIX) 40 MG tablet Take 1 tablet (40 mg total) by mouth daily. (Patient taking differently: Take 40 mg by mouth daily as needed (fluid retention.).)    gabapentin (NEURONTIN) 100 MG capsule Take 1 capsule (100 mg total) by mouth 2 (two) times daily.    levothyroxine (SYNTHROID) 175 MCG tablet Take 175 mcg by mouth daily before breakfast.    losartan (COZAAR) 50 MG tablet Take 1 tablet (50 mg total) by mouth daily.    Menthol, Topical Analgesic, (BIOFREEZE EX) Apply 1 application topically 3 (three) times daily as needed (pain. (calf muscle)). (Patient not taking: Reported on 03/08/2021)  Multiple Vitamins-Minerals (MEGA MULTIVITAMIN FOR MEN PO) Take 1 tablet by mouth daily.    predniSONE (DELTASONE) 5 MG tablet Take 1 tablet (5 mg total) by mouth daily with breakfast.    tamsulosin (FLOMAX) 0.4 MG CAPS capsule Take 1 capsule (0.4 mg total) by mouth at bedtime.    tiotropium (SPIRIVA HANDIHALER) 18 MCG inhalation  capsule Place 18 mcg into inhaler and inhale daily. (Patient not taking: Reported on 03/08/2021)    zolpidem (AMBIEN) 5 MG tablet Take 1 tablet (5 mg total) by mouth at bedtime as needed for sleep. (Patient taking differently: Take 2.5 mg by mouth at bedtime as needed for sleep.)    No facility-administered encounter medications on file as of 04/24/2021.   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   Claudication (HCC) 07/03/2020   Physical deconditioning 06/08/2020   Hemoptysis 02/16/2020   Syncope 10/04/2019   LBBB (left bundle branch block) 10/04/2019   Pacemaker 10/04/2019   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   Lung nodules 03/25/2019   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   Symptomatic anemia 01/20/2018   BENIGN PROSTATIC HYPERTROPHY 12/2017   Chronic respiratory failure with hypoxia (HCC) 12/01/2017   Lung mass 12/01/2017   PAF (paroxysmal atrial fibrillation) (HCC)    Ischemic cardiomyopathy 09/02/2017   Chronic systolic CHF (congestive heart failure) (HCC) 09/02/2017   Aortic aneurysm (HCC) 09/02/2017   Diabetes mellitus with complication, with long-term current use of insulin (HCC) 09/01/2017   Anxiety and depression 09/01/2017   COPD (chronic obstructive pulmonary disease) (HCC) 09/01/2017   Community acquired pneumonia 12/26/2016   CORONARY ARTERY DISEASE 08/28/2016   Myogenic ptosis of right eyelid 07/11/2016   Open angle with borderline findings, low risk, bilateral 07/11/2016   Bilateral dry eyes 04/14/2014   Localized anterior  staphyloma of both eyes 04/14/2014   Hyperlipidemia associated with type 2 diabetes mellitus (HCC) 01/05/2014   High risk medication use 10/07/2013   Pseudophakia of both eyes 10/07/2013   [place] of left macula 12/01/2012   Scleritis of both eyes 12/01/2012   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   PTSD (post-traumatic stress disorder) 03/10/2012   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   Rheumatoid arthritis (HCC) 09/09/2011   Scleromalacia 09/09/2011   OSA on CPAP 06/10/2011   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   GAIT DISTURBANCE 12/10/2009   Coronary atherosclerosis 11/23/2009   Hypothyroidism 07/30/2009   Essential hypertension 07/30/2009   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 addressed/monitored:  COPD and DMII  Care Plan : RN Care Manager Plan of Care  Updates made by Michaela Corner, RN since 04/24/2021 12:00 AM     Problem: Chronic Disease Management Needs   Priority: Medium     Long-Range Goal: Development of plan of care for long term chronic disease management   Start Date: 04/24/2021  Expected End Date: 04/24/2022  Priority: Medium  Note:   Current Barriers:  Chronic Disease Management support and education needs related to COPD and DMII  RNCM Clinical Goal(s):  Patient will demonstrate ongoing health management independence with COPD, DMII, multiple chronic health issues  through collaboration with RN Care manager,  provider, and care team.   Interventions: 1:1 collaboration with primary care provider regarding development and update of comprehensive plan of care as evidenced by provider attestation and co-signature Inter-disciplinary care team collaboration (see longitudinal plan of care) Evaluation of current treatment plan related to  self management and patient's adherence to plan as established by provider  COPD: (Status: New goal.) Reviewed medications with patient, including use of prescribed maintenance and rescue inhalers, and provided instruction on medication management  and the importance of adherence Provided patient with basic written and verbal COPD education on self care/management/and exacerbation prevention; Advised patient to track and manage COPD triggers;  Provided instruction about proper use of medications used for management of COPD including inhalers; Advised patient to self assesses COPD action plan zone and make appointment with provider if in the yellow zone for 48 hours without improvement; Advised patient to engage in light exercise as tolerated 3-5 days a week to aid in the the management of COPD; Provided education about and advised patient to utilize infection prevention strategies to reduce risk of respiratory infection; Discussed the importance of adequate rest and management of fatigue with COPD; Assessed social determinant of health barriers;  Discussed currently used medications for COPD: confirmed patient has good understanding of difference between maintenance and rescue inhalers: reports rarely needs to use rescue inhaler; reports has not used nebulizer in a very long time: encouraged him to have DME company service nebulizer should he need in future: he will consider Confirmed patient uses continuous home O2 at 2-3 L/min, has portable helios tank; uses CPAP q HS Discussed action plan for COPD flares: patient has good baseline understanding of same; managing COPD well at home on daily basis Discussed need for upcoming flu vaccine for 2022-23 flu season: we also discussed timing of next COVID vaccine- he has had 2 boosters, last one "about 7 months ago;" discussed newly available COVID vaccine to cover variants; patient is agreeable to speak with his outpatient pharmacist about timing around flu vaccine Confirmed patient is active and independent and regularly exercises- positive reinforcement provided; benefits of maintaining activity and exercise routine over time discussed  Confirmed patient follows low salt diet- positive reinforcement  provided  Diabetes:  (Status: New goal.) Lab Results  Component Value Date   HGBA1C 10.3 (H) 08/21/2020  Assessed patient's understanding of A1c goal: <7% Provided education to patient about basic DM disease process; Reviewed medications with patient and discussed importance of medication adherence;        Reviewed prescribed diet with patient low sugar/ carbohydrate, low salt, heart healthy; Counseled on importance of regular laboratory monitoring as prescribed;        Discussed plans with patient for ongoing care management follow up and provided patient with direct contact information for care management team;      Reviewed scheduled/upcoming provider appointments including: 05/20/21- [facility] provider for updated A1-C 06/06/21- pulmonary provider ([place]);         Review of patient status, including review of consultants reports, relevant laboratory and other test results, and medications completed;       Advised patient to discuss desire to obtain CGM with provider;      Discussed with patient his current home blood sugar monitoring routine: reports he "checks as often as he can" around ongoing issues obtaining blood from fingers with sticks- states his fingers are sore and do not give him much blood; tries to check QD, however sometimes misses checking Reviewed most recent A1-C level of "10.0-  10.5" with patient: he reports eating a lot of watermelon at the time of last A1-C; confirms that he has made dietary changes accordingly and has seen a decrease in his home blood sugar values recently; states he has never had an A1-C less than "high 7-8" and his personal goal is < 8.0 for his age Confirmed patient has discussed with [facility] about obtaining CGM- he reports he was told he does not currently qualify- he is considering asking provider if he could obtain if he paid out of pocket- this was encouraged; discussed ability of patient to be more empowered with blood sugar  management at home if he knows his daily blood sugars- patient is aware and agrees, but admits he is challenged in performing finger sticks- encouraged him to continue checking as often as he is able- he is agreeable Reviewed medications pertinent for COPD/ DMII with patient: he declines full medication review today, is not near his medications; independently manages medications, uses am/pm pill box, verbalizes a very good understanding of the purpose/ dosing/ scheduling of medications Discussed/ provided verbal education around benefits of exercise in setting of DMII/ COPD- positive reinforcement provided for patient staying regularly active/ following exercise routine at home   Patient Goals/Self-Care Activities: Patient will self administer medications as prescribed as evidenced by self report/primary caregiver report  Patient will attend all scheduled provider appointments as evidenced by clinician review of documented attendance to scheduled appointments and patient/caregiver report Patient will call pharmacy for medication refills as evidenced by patient report and review of pharmacy fill history as appropriate Patient will call provider office for new concerns or questions as evidenced by review of documented incoming telephone call notes and patient report Patient will continue to follow heart healthy, low salt, low carbohydrate, low sugar diet Patient will continue to monitor blood sugars at home- twice daily is best: first thing in the morning before eating; and then again 2 hours after a regular meal Patient will discuss obtaining continuous glucose monitor (CGM) with [facility] Patient will remain active and will continue his established exercise program at home Patient will obtain flu vaccine for 2022-23 flu season       Plan: Telephone follow up appointment with care management team member scheduled for:  Thursday July 25, 2021 at 10:00 am The patient has been provided  with contact information for the care management team and has been advised to call with any health related questions or concerns  [name], RN, BSN, CCRN [employer] RN Care Coordination- [facility] [place] [phone]: direct office [phone]: mobile

## 2021-04-25 DIAGNOSIS — Z79899 Other long term (current) drug therapy: Secondary | ICD-10-CM | POA: Diagnosis not present

## 2021-04-25 DIAGNOSIS — M1990 Unspecified osteoarthritis, unspecified site: Secondary | ICD-10-CM | POA: Diagnosis not present

## 2021-04-25 DIAGNOSIS — H15009 Unspecified scleritis, unspecified eye: Secondary | ICD-10-CM | POA: Diagnosis not present

## 2021-04-25 DIAGNOSIS — J449 Chronic obstructive pulmonary disease, unspecified: Secondary | ICD-10-CM | POA: Diagnosis not present

## 2021-04-25 DIAGNOSIS — M79643 Pain in unspecified hand: Secondary | ICD-10-CM | POA: Diagnosis not present

## 2021-04-25 DIAGNOSIS — D693 Immune thrombocytopenic purpura: Secondary | ICD-10-CM | POA: Diagnosis not present

## 2021-04-25 DIAGNOSIS — M0579 Rheumatoid arthritis with rheumatoid factor of multiple sites without organ or systems involvement: Secondary | ICD-10-CM | POA: Diagnosis not present

## 2021-04-25 DIAGNOSIS — D649 Anemia, unspecified: Secondary | ICD-10-CM | POA: Diagnosis not present

## 2021-04-25 DIAGNOSIS — R911 Solitary pulmonary nodule: Secondary | ICD-10-CM | POA: Diagnosis not present

## 2021-04-26 DIAGNOSIS — J449 Chronic obstructive pulmonary disease, unspecified: Secondary | ICD-10-CM | POA: Diagnosis not present

## 2021-04-26 DIAGNOSIS — Z794 Long term (current) use of insulin: Secondary | ICD-10-CM | POA: Diagnosis not present

## 2021-04-26 DIAGNOSIS — E118 Type 2 diabetes mellitus with unspecified complications: Secondary | ICD-10-CM

## 2021-04-26 DIAGNOSIS — J8410 Pulmonary fibrosis, unspecified: Secondary | ICD-10-CM | POA: Diagnosis not present

## 2021-04-26 DIAGNOSIS — G4733 Obstructive sleep apnea (adult) (pediatric): Secondary | ICD-10-CM | POA: Diagnosis not present

## 2021-04-26 DIAGNOSIS — E860 Dehydration: Secondary | ICD-10-CM | POA: Diagnosis not present

## 2021-05-07 ENCOUNTER — Telehealth: Payer: Self-pay

## 2021-05-07 NOTE — Telephone Encounter (Signed)
The patient fell Saturday. I asked the patient did he hit his head? He states no he fell on his knees and did not loose conscience. He just got really dizzy all of a sudden. The patient states is not sure if it is related to his heart but would like for the nurse to review his transmission. Transmission received. I let him speak with Portia, rn.

## 2021-05-14 DIAGNOSIS — G4733 Obstructive sleep apnea (adult) (pediatric): Secondary | ICD-10-CM | POA: Diagnosis not present

## 2021-05-14 DIAGNOSIS — J449 Chronic obstructive pulmonary disease, unspecified: Secondary | ICD-10-CM | POA: Diagnosis not present

## 2021-05-16 DIAGNOSIS — J449 Chronic obstructive pulmonary disease, unspecified: Secondary | ICD-10-CM | POA: Diagnosis not present

## 2021-05-26 DIAGNOSIS — J8410 Pulmonary fibrosis, unspecified: Secondary | ICD-10-CM | POA: Diagnosis not present

## 2021-05-26 DIAGNOSIS — G4733 Obstructive sleep apnea (adult) (pediatric): Secondary | ICD-10-CM | POA: Diagnosis not present

## 2021-05-26 DIAGNOSIS — J449 Chronic obstructive pulmonary disease, unspecified: Secondary | ICD-10-CM | POA: Diagnosis not present

## 2021-05-26 DIAGNOSIS — E860 Dehydration: Secondary | ICD-10-CM | POA: Diagnosis not present

## 2021-06-06 ENCOUNTER — Encounter: Payer: Self-pay | Admitting: Adult Health

## 2021-06-06 ENCOUNTER — Ambulatory Visit: Payer: Medicare PPO | Admitting: Adult Health

## 2021-06-06 ENCOUNTER — Other Ambulatory Visit: Payer: Self-pay

## 2021-06-06 DIAGNOSIS — Z9989 Dependence on other enabling machines and devices: Secondary | ICD-10-CM | POA: Diagnosis not present

## 2021-06-06 DIAGNOSIS — R918 Other nonspecific abnormal finding of lung field: Secondary | ICD-10-CM

## 2021-06-06 DIAGNOSIS — D696 Thrombocytopenia, unspecified: Secondary | ICD-10-CM

## 2021-06-06 DIAGNOSIS — J9611 Chronic respiratory failure with hypoxia: Secondary | ICD-10-CM

## 2021-06-06 DIAGNOSIS — J449 Chronic obstructive pulmonary disease, unspecified: Secondary | ICD-10-CM | POA: Diagnosis not present

## 2021-06-06 DIAGNOSIS — R5381 Other malaise: Secondary | ICD-10-CM | POA: Diagnosis not present

## 2021-06-06 DIAGNOSIS — G4733 Obstructive sleep apnea (adult) (pediatric): Secondary | ICD-10-CM

## 2021-06-06 DIAGNOSIS — I5022 Chronic systolic (congestive) heart failure: Secondary | ICD-10-CM

## 2021-06-06 NOTE — Patient Instructions (Addendum)
Continue on Stiolto 2 puffs daily .  Mucinex DM Twice daily  As needed  Cough/congestion .  Continue on Oxygen 2l/m rest and 3l/m with activity .  Continue on CPAP At bedtime wear with Naps.  Activity as tolerated.  Saline nasal rinses Twice daily   Saline nasal gel At bedtime .  Follow up in 3-4 Months  with Dr. Vassie Loll or Meera Vasco NP and As needed   Please contact office for sooner follow up if symptoms do not improve or worsen or seek emergency care

## 2021-06-06 NOTE — Assessment & Plan Note (Signed)
Activity as tolerated

## 2021-06-06 NOTE — Assessment & Plan Note (Signed)
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  Plan  Patient Instructions  Continue on Stiolto 2 puffs daily .  Mucinex DM Twice daily  As needed  Cough/congestion .  Continue on Oxygen 2l/m rest and 3l/m with activity .  Continue on CPAP At bedtime wear with Naps.  Activity as tolerated.  Saline nasal rinses Twice daily   Saline nasal gel At bedtime .  Follow up in 3-4 Months  with Dr. Vassie Loll or Ryli Standlee NP and As needed   Please contact office for sooner follow up if symptoms do not improve or worsen or seek emergency care

## 2021-06-06 NOTE — Assessment & Plan Note (Signed)
Continue with serial CT.  CT chest planned for March 2023

## 2021-06-06 NOTE — Assessment & Plan Note (Signed)
Continue on oxygen 2 L at rest and 3 L with activity. 

## 2021-06-06 NOTE — Assessment & Plan Note (Signed)
Excellent control compliance on nocturnal CPAP.  Continue on CPAP 10 to 20 cm H2O.  Plan ' Patient Instructions  Continue on Stiolto 2 puffs daily .  Mucinex DM Twice daily  As needed  Cough/congestion .  Continue on Oxygen 2l/m rest and 3l/m with activity .  Continue on CPAP At bedtime wear with Naps.  Activity as tolerated.  Saline nasal rinses Twice daily   Saline nasal gel At bedtime .  Follow up in 3-4 Months  with Dr. Vassie Loll or Halyn Flaugher NP and As needed   Please contact office for sooner follow up if symptoms do not improve or worsen or seek emergency care

## 2021-06-06 NOTE — Progress Notes (Signed)
@Patient  ID: [name], male    DOB: [birth year], 80 y.o.   MRN: [id number]  Chief Complaint  Patient presents with   Follow-up    Referring provider: [name], *  HPI: 80 year old male former smoker followed for very severe COPD, chronic hypoxic respiratory failure on oxygen at 2 L, obstructive sleep apnea on nocturnal CPAP,   Patient has a history of lung nodules followed on serial CT chest. Medical history significant for rheumatoid arthritis not on maintenance medications, diabetes, congestive heart failure, coronary disease status post RCA stent, chronic kidney disease, A. fib with previous use of amiodarone stopped in August 2019.  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  TEST/EVENTS :  PSG 04/2014 showed severe OSA, AHI 48/h with nadir desatn 78% correctd by CPAP 12 cm, 3 L O2 blended in , c flex +2 cm, humidity, A medium full face mask was used. 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 PFT 10/2015 FEV1 was 36%, ratio 46, FVC 58%, DLCO 26%     CT chest 12/2016 -masslike consolidation in LUL   PET scan [id number] >+metabolic act in LUL consolidation    CT chest 08/2017 >> 2.4 x 3.5 cm triangular subpleural opacity in the anterior left upper lobe > has resolved the platelike scarring bilateral nodular infiltrates stable,   CT chest 09/2020 severe emphysema, extensive pleural calcification.   Previous nodule in the right lower lobe has significantly decreased in size measuring 0.8 x 0.8 cm previously at 2.3 cm.  A stable left upper lobe cavitary nodule measuring 4 mm.  And a stable irregular nodule in the left lower lobe measuring 1.5 cm, and a left lower lobe nodule measuring 1.5 cm  06/06/2021 Follow up: COPD, OSA , O2 RF , Pneumonia  Patient returns for a 34-month follow-up.  Patient has underlying severe COPD that is oxygen dependent.  Since last visit patient says overall breathing is doing okay.  He gets short of breath with minimum activities..  He denies any increased cough or congestion.  He remains on Stiolto daily.  Patient remains on oxygen 2 L.  Denies any increased oxygen demands.  Patient is followed serially for lung nodules on CT scan.  CT chest in March showed decreased right lower lobe lung nodule.  He has a repeat CT in March 2023.  Patient is widowed.  Lives at home alone.  His daughter is local and can help him if needed.  Patient still drives.  Staying active . Goes to [employer] and walks with buggy .   Remains on CPAP at bedtime.  Patient says he is doing well on CPAP.  He denies any significant daytime sleepiness.  Wears a CPAP every single night.  CPAP download shows excellent compliance with 100% usage.  Daily average usage at 10 hours.  Patient is on auto CPAP 10 to 20 cm H2O.  AHI is 1.7/hour.  Going to get concealed carry permit ,  taking class this weekend.   Covid booster and flu shot last month /utd.  PVX and Prevnar utd.     No Known Allergies  Immunization History  Administered Date(s) Administered   Fluad Quad(high Dose 65+) 04/26/2019, 06/08/2020, 05/01/2021   Influenza Split 04/14/2011, 04/27/2013, 03/28/2014   Influenza Whole 05/28/2009, 04/27/2010, 03/31/2012   Influenza, High Dose Seasonal PF 04/10/2010, 04/11/2011, 04/19/2013, 03/21/2015, 04/05/2016, 05/16/2017, 03/28/2018, 04/08/2018   Influenza,inj,Quad PF,6+ Mos 03/17/2014,  04/04/2016   Influenza-Unspecified 04/30/2006, 04/28/2007, 04/28/2008, 05/29/2009, 04/10/2010, 05/26/2011, 05/07/2012, 05/28/2020   PFIZER Comirnaty(Gray Top)Covid-19 Tri-Sucrose Vaccine 11/29/2020, 05/01/2021   PFIZER(Purple Top)SARS-COV-2 Vaccination 08/19/2019, 09/09/2019, 04/08/2020, 12/09/2020   Pneumococcal Conjugate-13 09/20/2013, 03/29/2015   Pneumococcal Polysaccharide-23 05/28/2008, 04/25/2010, 04/19/2013   Pneumococcal-Unspecified 06/26/2006, 04/27/2013   Td 11/23/2009   Tdap 07/17/2011   Zoster, Live 04/28/2009, 11/08/2009    Past Medical History:  Diagnosis Date   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   ablated   Anxiety    Aortic aneurysm (HCC) 09/02/2017   BENIGN PROSTATIC HYPERTROPHY 11/23/2009   Cardiomyopathy (HCC) 08/28/2016   Chronic systolic CHF (congestive heart failure) (HCC) 09/02/2017   COPD (chronic obstructive pulmonary disease) (HCC)    CORONARY ARTERY DISEASE 11/23/2009   DECREASED HEARING, 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   DEGENERATIVE JOINT DISEASE 11/23/2009   DEPRESSION 11/23/2009   FATIGUE 11/23/2009   GAIT DISTURBANCE 12/10/2009   HEMOPTYSIS UNSPECIFIED 05/07/2010   High cholesterol    HYPERTENSION 07/30/2009   HYPOTHYROIDISM 07/30/2009   Ischemic cardiomyopathy 09/02/2017   LUMBAR RADICULOPATHY, 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   On home oxygen therapy    "2-3L; 24/7" (09/10/2016)   OSA on CPAP    Pneumonia    PTSD (post-traumatic stress disorder) 03/10/2012   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   RA (rheumatoid arthritis) (HCC) 06/11/2011   "qwhere" (09/10/2016)   RESPIRATORY FAILURE, CHRONIC 07/31/2009   Scleritis of both eyes 03/17/2014   Thrombocytopenia (HCC)    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   Type II diabetes mellitus (HCC)     Tobacco History: Social History   Tobacco Use  Smoking Status Former   Packs/day: 2.50   Years: 40.00   Pack years: 100.00   Types: Cigarettes, Pipe, Cigars   Quit date: 07/28/1998   Years since quitting: 22.8  Smokeless Tobacco Never   Counseling given: Not  Answered   Outpatient Medications Prior to Visit  Medication Sig Dispense Refill   albuterol (PROVENTIL) (2.5 MG/3ML) 0.083% nebulizer solution Take 3 mLs (2.5 mg total) by nebulization every 6 (six) hours as needed for wheezing or shortness of breath. 120 mL 12   albuterol (VENTOLIN HFA) 108 (90 Base) MCG/ACT inhaler Inhale 2 puffs into the lungs every 6 (six) hours as needed for wheezing or shortness of breath. 8 g 6   atorvastatin (LIPITOR) 40 MG tablet Take 1 tablet (40 mg total) by mouth daily. 90 tablet 3   Carboxymethylcellul-Glycerin (LUBRICATING EYE DROPS OP) Place 1 drop into both eyes 4 (four) times daily as needed (dry eyes).      Cholecalciferol (VITAMIN D3) 2000 units capsule Take 2,000 Units by mouth daily.      empagliflozin (JARDIANCE) 10 MG TABS tablet Take 5 mg by mouth daily. Filled at [facility]     gabapentin (NEURONTIN) 100 MG capsule Take 1 capsule (100 mg total) by mouth 2 (two) times daily. 90 capsule 0   insulin glargine (LANTUS) 100 UNIT/ML injection Inject 45 Units into the skin at bedtime.     levothyroxine (SYNTHROID) 175 MCG tablet Take 175 mcg by mouth daily before breakfast.  losartan (COZAAR) 50 MG tablet Take 1 tablet (50 mg total) by mouth daily. 90 tablet 3   Menthol, Topical Analgesic, (BIOFREEZE EX) Apply 1 application topically 3 (three) times daily as needed (pain. (calf muscle)).     metFORMIN (GLUCOPHAGE) 500 MG tablet Take 500-1,000 mg by mouth 2 (two) times daily with a meal. Take 1 tablet (500 mg) by mouth in the morning & take 2 tablets (1000 mg) by mouth in the evening.     Multiple Vitamins-Minerals (MEGA MULTIVITAMIN FOR MEN PO) Take 1 tablet by mouth daily.     OXYGEN Inhale 2 L into the lungs continuous.     predniSONE (DELTASONE) 5 MG tablet Take 1 tablet (5 mg total) by mouth daily with breakfast. 90 tablet 3   Semaglutide (OZEMPIC, 1 MG/DOSE, [place]) Inject 1 mg into the skin every Friday.     tamsulosin (FLOMAX) 0.4 MG CAPS capsule Take 1 capsule  (0.4 mg total) by mouth at bedtime. 30 capsule 0   Tiotropium Bromide-Olodaterol (STIOLTO RESPIMAT) 2.5-2.5 MCG/ACT AERS Inhale 2 puffs into the lungs daily. 4 g 0   furosemide (LASIX) 40 MG tablet Take 1 tablet (40 mg total) by mouth daily. (Patient taking differently: Take 40 mg by mouth daily as needed (fluid retention.).) 90 tablet 3   tiotropium (SPIRIVA HANDIHALER) 18 MCG inhalation capsule Place 18 mcg into inhaler and inhale daily. (Patient not taking: No sig reported)     zolpidem (AMBIEN) 5 MG tablet Take 1 tablet (5 mg total) by mouth at bedtime as needed for sleep. (Patient not taking: Reported on 06/06/2021) 30 tablet 0   No facility-administered medications prior to visit.     Review of Systems:   Constitutional:   No  weight loss, night sweats,  Fevers, chills,  +fatigue, or  lassitude.  HEENT:   No headaches,  Difficulty swallowing,  Tooth/dental problems, or  Sore throat,                No sneezing, itching, ear ache,  +nasal congestion, post nasal drip,   CV:  No chest pain,  Orthopnea, PND, swelling in lower extremities, anasarca, dizziness, palpitations, syncope.   GI  No heartburn, indigestion, abdominal pain, nausea, vomiting, diarrhea, change in bowel habits, loss of appetite, bloody stools.   Resp:   No chest wall deformity  Skin: no rash or lesions.  GU: no dysuria, change in color of urine, no urgency or frequency.  No flank pain, no hematuria   MS:  No joint pain or swelling.  No decreased range of motion.  No back pain.    Physical Exam  BP (!) 142/64 (BP Location: Left Arm, Patient Position: Sitting, Cuff Size: Normal)   Pulse 72   Temp 98.2 F (36.8 C) (Oral)   Ht 6\' 2"  (1.88 m)   Wt 217 lb 12.8 oz (98.8 kg)   SpO2 96%   BMI 27.96 kg/m   GEN: A/Ox3; pleasant , NAD, chronically ill-appearing on oxygen   HEENT:  [place]/AT,  NOSE-clear, THROAT-clear, no lesions, no postnasal drip or exudate noted.   NECK:  Supple w/ fair ROM; no JVD; normal  carotid impulses w/o bruits; no thyromegaly or nodules palpated; no lymphadenopathy.    RESP few scattered rhonchi  no accessory muscle use, no dullness to percussion  CARD:  RRR, no m/r/g, tr peripheral edema, pulses intact, no cyanosis or clubbing.,  TED hose  GI:   Soft & nt; nml bowel sounds; no organomegaly or masses detected.  Musco: Warm bil, no deformities or joint swelling noted.   Neuro: alert, no focal deficits noted.    Skin: Warm, no lesions or rashes    Lab Results:      BNP No results found for: BNP  ProBNP   Imaging: No results found.    PFT Results Latest Ref Rng & Units 11/16/2015 12/30/2013  FVC-Pre L 2.96 3.01  FVC-Predicted Pre % 59 59  FVC-Post L 2.89 3.01  FVC-Predicted Post % 58 59  Pre FEV1/FVC % % 43 45  Post FEV1/FCV % % 46 49  FEV1-Pre L 1.28 1.36  FEV1-Predicted Pre % 35 36  FEV1-Post L 1.33 1.48  DLCO uncorrected ml/min/mmHg 9.88 10.13  DLCO UNC% % 26 26  DLCO corrected ml/min/mmHg 9.94 -  DLCO COR %Predicted % 26 -  DLVA Predicted % 40 39  TLC L - 6.31  TLC % Predicted % - 80  RV % Predicted % - 80    No results found for: NITRICOXIDE      Assessment & Plan:   COPD (chronic obstructive pulmonary disease) (HCC) Severe COPD that is oxygen dependent currently compensated on present regimen.  Plan  Patient Instructions  Continue on Stiolto 2 puffs daily .  Mucinex DM Twice daily  As needed  Cough/congestion .  Continue on Oxygen 2l/m rest and 3l/m with activity .  Continue on CPAP At bedtime wear with Naps.  Activity as tolerated.  Saline nasal rinses Twice daily   Saline nasal gel At bedtime .  Follow up in 3-4 Months  with Dr. Elsworth Soho or Kelechi Astarita NP and As needed   Please contact office for sooner follow up if symptoms do not improve or worsen or seek emergency care       Chronic systolic CHF (congestive heart failure) ([place]) Appears compensated on exam.  Continue current regimen and follow-up with  cardiology  Chronic respiratory failure with hypoxia ([place]) Continue on oxygen 2 L at rest and 3 L with activity.  Thrombocytopenia ([place]) Continue follow-up with hematology  Physical deconditioning Activity as tolerated.  OSA on CPAP Excellent control compliance on nocturnal CPAP.  Continue on CPAP 10 to 20 cm H2O.  Plan ' Patient Instructions  Continue on Stiolto 2 puffs daily .  Mucinex DM Twice daily  As needed  Cough/congestion .  Continue on Oxygen 2l/m rest and 3l/m with activity .  Continue on CPAP At bedtime wear with Naps.  Activity as tolerated.  Saline nasal rinses Twice daily   Saline nasal gel At bedtime .  Follow up in 3-4 Months  with Dr. Elsworth Soho or [place] NP and As needed   Please contact office for sooner follow up if symptoms do not improve or worsen or seek emergency care       Lung nodules Continue with serial CT.  CT chest planned for March 2023     Rexene Edison, NP 06/06/2021

## 2021-06-06 NOTE — Assessment & Plan Note (Addendum)
Continue follow up with hematology.

## 2021-06-06 NOTE — Assessment & Plan Note (Signed)
Appears compensated on exam.  Continue current regimen and follow-up with cardiology

## 2021-06-19 ENCOUNTER — Telehealth: Payer: Self-pay | Admitting: Internal Medicine

## 2021-06-19 NOTE — Telephone Encounter (Signed)
Patient requesting a call back, patient declined to leave the nature of the call

## 2021-06-25 NOTE — Telephone Encounter (Signed)
Called pt, after one ring the call disconnected. No option to leave a voicemail.

## 2021-06-26 ENCOUNTER — Encounter: Payer: Self-pay | Admitting: Internal Medicine

## 2021-06-26 ENCOUNTER — Telehealth: Payer: Self-pay | Admitting: Internal Medicine

## 2021-06-26 NOTE — Telephone Encounter (Signed)
Ok to refill 

## 2021-06-26 NOTE — Telephone Encounter (Signed)
Ok to send in freestyle Claude

## 2021-06-26 NOTE — Telephone Encounter (Signed)
Patient calling in  Wants to know if provider would send in a prescription for a Freestyle Libre system  Patient says he is currently being treated for his diabetes by the [place] but they wont write a prescription & he normally checks his levels by pricking his finger but he says he has been doing that for a long time & it is starting to become uncomfortable  Patient would like a call back from nurse [phone]

## 2021-06-27 MED ORDER — FREESTYLE LIBRE 14 DAY READER DEVI: 1.0000 | Freq: Every day | 5 refills | Status: DC

## 2021-06-27 NOTE — Telephone Encounter (Signed)
The freestyle Josephine Igo is the continuous glucose monitoring device so sig should be just to monitor blood sugar

## 2021-06-28 ENCOUNTER — Ambulatory Visit (INDEPENDENT_AMBULATORY_CARE_PROVIDER_SITE_OTHER): Payer: Medicare PPO

## 2021-06-28 DIAGNOSIS — I255 Ischemic cardiomyopathy: Secondary | ICD-10-CM

## 2021-07-01 LAB — CUP PACEART REMOTE DEVICE CHECK
Battery Remaining Longevity: 146 mo
Battery Voltage: 3.02 V
Brady Statistic AP VP Percent: 32.58 %
Brady Statistic AP VS Percent: 2.47 %
Brady Statistic AS VP Percent: 23.44 %
Brady Statistic AS VS Percent: 41.51 %
Brady Statistic RA Percent Paced: 35.18 %
Brady Statistic RV Percent Paced: 56.01 %
Date Time Interrogation Session: 20221201222757
Implantable Lead Implant Date: 20201204
Implantable Lead Implant Date: 20201204
Implantable Lead Location: 753859
Implantable Lead Location: 753860
Implantable Lead Model: 5076
Implantable Lead Model: 5076
Implantable Pulse Generator Implant Date: 20201204
Lead Channel Impedance Value: 361 Ohm
Lead Channel Impedance Value: 380 Ohm
Lead Channel Impedance Value: 437 Ohm
Lead Channel Impedance Value: 437 Ohm
Lead Channel Pacing Threshold Amplitude: 0.5 V
Lead Channel Pacing Threshold Amplitude: 0.5 V
Lead Channel Pacing Threshold Pulse Width: 0.4 ms
Lead Channel Pacing Threshold Pulse Width: 0.4 ms
Lead Channel Sensing Intrinsic Amplitude: 2.125 mV
Lead Channel Sensing Intrinsic Amplitude: 2.125 mV
Lead Channel Sensing Intrinsic Amplitude: 8.25 mV
Lead Channel Sensing Intrinsic Amplitude: 8.25 mV
Lead Channel Setting Pacing Amplitude: 1.5 V
Lead Channel Setting Pacing Amplitude: 2.5 V
Lead Channel Setting Pacing Pulse Width: 0.4 ms
Lead Channel Setting Sensing Sensitivity: 2 mV

## 2021-07-09 NOTE — Progress Notes (Signed)
Remote pacemaker transmission.   

## 2021-07-15 ENCOUNTER — Telehealth: Payer: Self-pay | Admitting: Internal Medicine

## 2021-07-15 NOTE — Telephone Encounter (Signed)
[name] w/ [facility] informing provider a fax will be sent today 12-19 requesting patient's last office notes  Please advise

## 2021-07-16 NOTE — Telephone Encounter (Signed)
Noted  

## 2021-07-17 ENCOUNTER — Encounter: Payer: Self-pay | Admitting: Nurse Practitioner

## 2021-07-17 ENCOUNTER — Telehealth: Payer: Self-pay | Admitting: Pulmonary Disease

## 2021-07-17 ENCOUNTER — Telehealth (INDEPENDENT_AMBULATORY_CARE_PROVIDER_SITE_OTHER): Payer: Medicare PPO | Admitting: Nurse Practitioner

## 2021-07-17 DIAGNOSIS — J9611 Chronic respiratory failure with hypoxia: Secondary | ICD-10-CM

## 2021-07-17 DIAGNOSIS — Z9989 Dependence on other enabling machines and devices: Secondary | ICD-10-CM | POA: Diagnosis not present

## 2021-07-17 DIAGNOSIS — G4733 Obstructive sleep apnea (adult) (pediatric): Secondary | ICD-10-CM

## 2021-07-17 DIAGNOSIS — J069 Acute upper respiratory infection, unspecified: Secondary | ICD-10-CM | POA: Diagnosis not present

## 2021-07-17 DIAGNOSIS — J441 Chronic obstructive pulmonary disease with (acute) exacerbation: Secondary | ICD-10-CM

## 2021-07-17 MED ORDER — FLUTICASONE PROPIONATE 50 MCG/ACT NA SUSP: 1.0000 | Freq: Every day | NASAL | 2 refills | Status: DC

## 2021-07-17 MED ORDER — AMOXICILLIN-POT CLAVULANATE 875-125 MG PO TABS: 1.0000 | ORAL_TABLET | Freq: Two times a day (BID) | ORAL | 0 refills | Status: DC

## 2021-07-17 MED ORDER — PREDNISONE 20 MG PO TABS
40.0000 mg | ORAL_TABLET | Freq: Every day | ORAL | 0 refills | Status: AC
Start: 2021-07-17 — End: 2021-07-22

## 2021-07-17 NOTE — Assessment & Plan Note (Signed)
Continue on supplemental O2 2-3 lpm. Notify of increasing requirements. SpO2 goal >88-90%

## 2021-07-17 NOTE — Assessment & Plan Note (Addendum)
Likely related to viral etiology. Non-toxic and in no acute distress. Increased SOB with increased productive cough. No fevers or chills. Advised COVID and flu testing - pt to notify of results. Supportive care. Continue Stiolto. Augmentin 7 day course and increased Prednisone to 40 mg for 5 days. If no improvement or worsening symptoms, in office visit and CXR for further evaluation. Discussed ER precautions and reason for in person visit. Verbalized understanding. Daughter helps patient at home and will be over tomorrow - advised masking and frequent hand washing.   Patient Instructions  -Continue on Stiolto 2 puffs daily -Continue albuterol inhaler 2 puffs or 21mL neb every 6 hours as needed for shortness of breath or wheezing -Continue Mucinex Twice daily for congestion  -Continue on supplemental oxygen at 2 lpm at rest and 3 lpm with activity. Notify of increasing requirements. Oxygen saturation goal >88-90% -Continue saline nasal rinses 2-3 times a day -Continue saline nasal gel at bedtime   -Augmentin 875 Twice daily for 7 days. Notify immediately of any rash, itching, hives, or swelling, or seek emergency care.Finish your antibiotics in their entirety. Do not stop just because symptoms improve. Take with food to reduce GI upset. -Over the counter probiotic daily while on Augmentin -Increase prednisone to 40 mg for 5 days then return to your daily 5mg  dose. Take in AM with food.  -Flonase nasal spray 1-2 sprays each nostril for nasal congestion -Tylenol 650 mg every 6 hours over the counter for sinus pressure/pain or headaches.   Advised COVID and Flu testing.   Notify if worsening breathlessness, cough, mucus production, fatigue, or wheezing occurs.  Maintain up to date vaccinations, including influenza, COVID, and pneumococcal.  Wash your hands often and avoid sick exposures.  Encouraged masking in crowds.  Avoid triggers, when possible.  Exercise, as tolerated. Notify if worsening  symptoms upon exertion occur.    Continue to use CPAP every night, minimum of 4-6 hours a night.  Change equipment every 30 days or as directed by DME. Wash your tubing with warm soap and water daily, hang to dry. Wash humidifier portion weekly.  Maintain clean equipment, as directed by home health agency.  Be aware of reduced alertness and do not drive or operate heavy machinery if experiencing this or drowsiness.  Healthy weight management discussed.  Avoid or decrease alcohol consumption and medications that make you more sleepy, if possible. Notify if persistent daytime sleepiness occurs even with consistent use of CPAP.  Follow up as scheduled with Dr. or APP. If symptoms do not improve or worsen, please contact office for sooner follow up or seek emergency care.

## 2021-07-17 NOTE — Progress Notes (Signed)
Patient ID: [name], male     DOB: 04-08-1941, 80 y.o.      MRN: [id number]  Chief Complaint  Patient presents with   Acute Visit    SOB/ congestion/ sore throat last couple days     Virtual Visit via Video Note  I connected with [name] on 07/17/21 at  4:00 PM EST by a video enabled telemedicine application and verified that I am speaking with the correct person using two identifiers.  Location: Patient: Home Provider: Office   I discussed the limitations of evaluation and management by telemedicine and the availability of in person appointments. The patient expressed understanding and agreed to proceed.  History of Present Illness: 80 year old male, former smoker (100 pack years) followed for very severe COPD, chronic hypoxic respiratory failure on oxygen at 2 L, obstructive sleep apnea on nocturnal CPAP.  History of lung nodules followed on serial CT chest.  He is a patient of [name] and was last seen in office on 06/06/2021 by [name].  Past medical history significant for rheumatoid arthritis not on maintenance medications, diabetes, CHF, CAD status post RCA stent, CKD, s/p left common femoral to popliteal bypass, pacemaker implantation after syncopal episodes, and A. 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  Followed by the [facility] system; he is retired Nature conservation officer and gets medications at the [place] system.  06/06/2021: OV with [name].  Doing well on current regimen.  Continued Stiolto 2 puffs daily.  Continued CPAP auto 10-20 cmH2O therapy at night and supplemental oxygen 2 to 3 L during the day. Continue with serial CT - March 2023. Follow up in 3-4 months.  07/17/2021: Today - acute sick visit Patient presents today via virtual video visit for cough, increased shortness of  breath, and sore throat. His symptoms started over the weekend with shortness of breath starting Monday. He describes his cough as productive with increase in sputum and yellow color. He has had nasal congestion with clear rhinorrhea. His shortness of breath is primarily with exertion and increased from baseline. He has noticed occasional wheezing that resolves without intervention. He denies fever, chills or known sick exposures. He denies orthopnea, PND, chest pain, or lower extremity swelling. He continues on supplemental oxygen at 2lpm at rest and 3 lpm with activity and has maintained saturations >88%. He continues on Stiolto daily and mucinex twice a day. He has not been using his rescue inhaler. He continues on CPAP therapy without difficulties. His daughter is coming over tomorrow and will help him with COVID testing.   No Known Allergies Immunization History  Administered Date(s) Administered   Fluad Quad(high Dose 65+) 04/26/2019, 06/08/2020, 05/01/2021   Influenza Split 04/14/2011, 04/27/2013, 03/28/2014   Influenza Whole 05/28/2009, 04/27/2010, 03/31/2012   Influenza, High Dose Seasonal PF 04/10/2010, 04/11/2011, 04/19/2013, 03/21/2015, 04/05/2016, 05/16/2017, 03/28/2018, 04/08/2018   Influenza,inj,Quad PF,6+ Mos 03/17/2014, 04/04/2016   Influenza-Unspecified 04/30/2006, 04/28/2007, 04/28/2008, 05/29/2009, 04/10/2010, 05/26/2011, 05/07/2012, 05/28/2020   PFIZER Comirnaty(Gray Top)Covid-19 Tri-Sucrose Vaccine 11/29/2020, 05/01/2021   PFIZER(Purple Top)SARS-COV-2 Vaccination 08/19/2019, 09/09/2019, 04/08/2020, 12/09/2020   Pneumococcal Conjugate-13 09/20/2013, 03/29/2015   Pneumococcal Polysaccharide-23 05/28/2008, 04/25/2010, 04/19/2013   Pneumococcal-Unspecified 06/26/2006, 04/27/2013   Td 11/23/2009   Tdap 07/17/2011   Zoster, Live 04/28/2009, 11/08/2009   Past Medical History:  Diagnosis Date  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   ablated   Anxiety    Aortic aneurysm ([place])  09/02/2017   BENIGN PROSTATIC HYPERTROPHY 11/23/2009   Cardiomyopathy ([place]) [id number]   Chronic systolic CHF (congestive heart failure) ([place]) 09/02/2017   COPD (chronic obstructive pulmonary disease) ([place])    CORONARY ARTERY DISEASE 11/23/2009   DECREASED HEARING, 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   DEGENERATIVE JOINT DISEASE 11/23/2009   DEPRESSION 11/23/2009   FATIGUE 11/23/2009   GAIT DISTURBANCE 12/10/2009   HEMOPTYSIS UNSPECIFIED 05/07/2010   High cholesterol    HYPERTENSION 07/30/2009   HYPOTHYROIDISM 07/30/2009   Ischemic cardiomyopathy 09/02/2017   LUMBAR RADICULOPATHY, 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   On home oxygen therapy    "2-3L; 24/7" (09/10/2016)   OSA on CPAP    Pneumonia    PTSD (post-traumatic stress disorder) 03/10/2012   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   RA (rheumatoid arthritis) ([place]) 06/11/2011   "qwhere" (09/10/2016)   RESPIRATORY FAILURE, CHRONIC 07/31/2009   Scleritis of both eyes 03/17/2014   Thrombocytopenia ([place])    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   Type II diabetes mellitus ([place])     Tobacco History: Social History   Tobacco Use  Smoking Status Former   Packs/day: 2.50   Years: 40.00   Pack years: 100.00   Types: Cigarettes, Pipe, Cigars   Quit date: 07/28/1998   Years since quitting: 22.9  Smokeless Tobacco Never   Counseling given: Not Answered   Outpatient Medications Prior to Visit  Medication Sig Dispense Refill   albuterol (PROVENTIL) (2.5 MG/3ML) 0.083% nebulizer solution Take 3 mLs (2.5 mg total) by nebulization every 6 (six) hours as needed for wheezing or shortness of breath. 120 mL 12   albuterol (VENTOLIN HFA) 108 (90 Base) MCG/ACT inhaler Inhale 2 puffs into the lungs every 6 (six) hours as needed for wheezing or shortness of breath. 8 g 6   atorvastatin (LIPITOR) 40 MG tablet Take 1 tablet (40 mg total) by mouth daily. 90 tablet 3   Carboxymethylcellul-Glycerin (LUBRICATING EYE DROPS OP) Place 1 drop into both eyes 4 (four) times daily as needed (dry eyes).      Cholecalciferol (VITAMIN D3)  2000 units capsule Take 2,000 Units by mouth daily.      Continuous Blood Gluc Receiver (FREESTYLE LIBRE 14 DAY READER) DEVI 1 Stick by Does not apply route daily. 1 each 5   empagliflozin (JARDIANCE) 10 MG TABS tablet Take 5 mg by mouth daily. Filled at [facility].     gabapentin (NEURONTIN) 100 MG capsule Take 1 capsule (100 mg total) by mouth 2 (two) times daily. 90 capsule 0   insulin glargine (LANTUS) 100 UNIT/ML injection Inject 45 Units into the skin at bedtime.     levothyroxine (SYNTHROID) 175 MCG tablet Take 175 mcg by mouth daily before breakfast.     losartan (COZAAR) 50 MG tablet Take 1 tablet (50 mg total) by mouth daily. 90 tablet 3   Menthol, Topical Analgesic, (BIOFREEZE EX) Apply 1 application topically 3 (three) times daily as needed (pain. (calf muscle)).     metFORMIN (GLUCOPHAGE) 500 MG tablet Take 500-1,000 mg by mouth 2 (two) times daily with a meal. Take 1 tablet (500 mg) by mouth in the morning & take 2 tablets (1000 mg) by mouth in the evening.     Multiple Vitamins-Minerals (MEGA MULTIVITAMIN FOR MEN PO) Take 1 tablet by mouth daily.     OXYGEN Inhale 2 L into the lungs continuous.     predniSONE (DELTASONE) 5 MG tablet Take 1 tablet (5 mg total) by  mouth daily with breakfast. 90 tablet 3   Semaglutide (OZEMPIC, 1 MG/DOSE, ) Inject 1 mg into the skin every Friday.     tamsulosin (FLOMAX) 0.4 MG CAPS capsule Take 1 capsule (0.4 mg total) by mouth at bedtime. 30 capsule 0   Tiotropium Bromide-Olodaterol (STIOLTO RESPIMAT) 2.5-2.5 MCG/ACT AERS Inhale 2 puffs into the lungs daily. 4 g 0   furosemide (LASIX) 40 MG tablet Take 1 tablet (40 mg total) by mouth daily. (Patient taking differently: Take 40 mg by mouth daily as needed (fluid retention.).) 90 tablet 3   No facility-administered medications prior to visit.     Review of Systems:   Constitutional: No weight loss or gain, night sweats, fevers, chills. +fatigue HEENT: No headaches, difficulty swallowing, tooth/dental  problems. No sneezing, itching, ear ache. +nasal congestion, clear rhinorrhea, sore throat CV:  No chest pain, orthopnea, PND, swelling in lower extremities, anasarca, dizziness, palpitations, syncope Resp: +shortness of breath with exertion; increased productive cough with yellow sputum; occasional wheeze. No hemoptysis.  No chest wall deformity GI:  No heartburn, indigestion, abdominal pain, nausea, vomiting, diarrhea, change in bowel habits, loss of appetite, bloody stools.  GU: No dysuria, change in color of urine, urgency or frequency.  No flank pain, no hematuria  Skin: No rash, lesions, ulcerations MSK:  No joint pain or swelling.  No decreased range of motion.  No back pain. Neuro: No dizziness or lightheadedness.  Psych: No depression or anxiety. Mood stable.   Observations/Objective: Interactive, pleasant. Well-appearing and in no acute distress. Breathing regular and unlabored. No cough noted during visit. O2 in the 90's   Assessment and Plan: COPD with acute exacerbation ([place]) Likely related to viral etiology. Non-toxic and in no acute distress. Increased SOB with increased productive cough. No fevers or chills. Advised COVID and flu testing - pt to notify of results. Supportive care. Continue Stiolto. Augmentin 7 day course and increased Prednisone to 40 mg for 5 days. If no improvement or worsening symptoms, in office visit and CXR for further evaluation. Discussed ER precautions and reason for in person visit. Verbalized understanding. Daughter helps patient at home and will be over tomorrow - advised masking and frequent hand washing.   Patient Instructions  -Continue on Stiolto 2 puffs daily -Continue albuterol inhaler 2 puffs or 64mL neb every 6 hours as needed for shortness of breath or wheezing -Continue Mucinex Twice daily for congestion  -Continue on supplemental oxygen at 2 lpm at rest and 3 lpm with activity. Notify of increasing requirements. Oxygen saturation goal  >88-90% -Continue saline nasal rinses 2-3 times a day -Continue saline nasal gel at bedtime   -Augmentin 875 Twice daily for 7 days. Notify immediately of any rash, itching, hives, or swelling, or seek emergency care.Finish your antibiotics in their entirety. Do not stop just because symptoms improve. Take with food to reduce GI upset. -Over the counter probiotic daily while on Augmentin -Increase prednisone to 40 mg for 5 days then return to your daily 5mg  dose. Take in AM with food.  -Flonase nasal spray 1-2 sprays each nostril for nasal congestion -Tylenol 650 mg every 6 hours over the counter for sinus pressure/pain or headaches.   Advised COVID and Flu testing.   Notify if worsening breathlessness, cough, mucus production, fatigue, or wheezing occurs.  Maintain up to date vaccinations, including influenza, COVID, and pneumococcal.  Wash your hands often and avoid sick exposures.  Encouraged masking in crowds.  Avoid triggers, when possible.  Exercise, as tolerated. Notify  if worsening symptoms upon exertion occur.    Continue to use CPAP every night, minimum of 4-6 hours a night.  Change equipment every 30 days or as directed by DME. Wash your tubing with warm soap and water daily, hang to dry. Wash humidifier portion weekly.  Maintain clean equipment, as directed by home health agency.  Be aware of reduced alertness and do not drive or operate heavy machinery if experiencing this or drowsiness.  Healthy weight management discussed.  Avoid or decrease alcohol consumption and medications that make you more sleepy, if possible. Notify if persistent daytime sleepiness occurs even with consistent use of CPAP.  Follow up as scheduled with Dr. Vassie Loll or APP. If symptoms do not improve or worsen, please contact office for sooner follow up or seek emergency care.   OSA on CPAP Compliant with CPAP auto 10-20 cmH2O therapy.   URI (upper respiratory infection) Likely viral etiology. Advised  COVID and flu testing. Flonase nasal spray. See above.   Chronic respiratory failure with hypoxia (HCC) Continue on supplemental O2 2-3 lpm. Notify of increasing requirements. SpO2 goal >88-90%    Follow Up Instructions: Follow up as scheduled with Dr. Vassie Loll on 08/28/2021. If symptoms do not improve or worsen, please contact office for sooner follow up or seek emergency care.  I discussed the assessment and treatment plan with the patient. The patient was provided an opportunity to ask questions and all were answered. The patient agreed with the plan and demonstrated an understanding of the instructions.   The patient was advised to call back or seek an in-person evaluation if the symptoms worsen or if the condition fails to improve as anticipated.  I provided 30 minutes of non-face-to-face time during this encounter.   Noemi Chapel, Aguilar

## 2021-07-17 NOTE — Patient Instructions (Addendum)
-  Continue on Stiolto 2 puffs daily -Continue albuterol inhaler 2 puffs or 68mL neb every 6 hours as needed for shortness of breath or wheezing -Continue Mucinex Twice daily for congestion  -Continue on supplemental oxygen at 2 lpm at rest and 3 lpm with activity. Notify of increasing requirements. Oxygen saturation goal >88-90% -Continue saline nasal rinses 2-3 times a day -Continue saline nasal gel at bedtime   -Augmentin 875 Twice daily for 7 days. Notify immediately of any rash, itching, hives, or swelling, or seek emergency care.Finish your antibiotics in their entirety. Do not stop just because symptoms improve. Take with food to reduce GI upset. -Over the counter probiotic daily while on Augmentin -Increase prednisone to 40 mg for 5 days then return to your daily 5mg  dose. Take in AM with food.  -Flonase nasal spray 1-2 sprays each nostril for nasal congestion -Tylenol 650 mg every 6 hours over the counter for sinus pressure/pain or headaches.   Advised COVID and Flu testing.   Notify if worsening breathlessness, cough, mucus production, fatigue, or wheezing occurs.  Maintain up to date vaccinations, including influenza, COVID, and pneumococcal.  Wash your hands often and avoid sick exposures.  Encouraged masking in crowds.  Avoid triggers, when possible.  Exercise, as tolerated. Notify if worsening symptoms upon exertion occur.    Continue to use CPAP every night, minimum of 4-6 hours a night.  Change equipment every 30 days or as directed by DME. Wash your tubing with warm soap and water daily, hang to dry. Wash humidifier portion weekly.  Maintain clean equipment, as directed by home health agency.  Be aware of reduced alertness and do not drive or operate heavy machinery if experiencing this or drowsiness.  Healthy weight management discussed.  Avoid or decrease alcohol consumption and medications that make you more sleepy, if possible. Notify if persistent daytime sleepiness  occurs even with consistent use of CPAP.  Follow up as scheduled with Dr. or APP. If symptoms do not improve or worsen, please contact office for sooner follow up or seek emergency care.

## 2021-07-17 NOTE — Assessment & Plan Note (Signed)
Compliant with CPAP auto 10-20 cmH2O therapy.

## 2021-07-17 NOTE — Assessment & Plan Note (Signed)
Likely viral etiology. Advised COVID and flu testing. Flonase nasal spray. See above.

## 2021-07-17 NOTE — Telephone Encounter (Signed)
Spoke with the pt  He is c/o sore throat, cough, SOB and wheezing x 2 days  He states that he is coughing up light yellow sputum  He denies fevers, aches  He has not taken a covid test  Video visit with Florentina Addison today at 4 pm

## 2021-07-24 NOTE — Telephone Encounter (Signed)
[name] w/ [facility] checking status of return fax and latest ov notes that addresses patient's diabetes diagnosis   Rep states he will resend fax today 12-28  Please advise

## 2021-07-25 ENCOUNTER — Ambulatory Visit (INDEPENDENT_AMBULATORY_CARE_PROVIDER_SITE_OTHER): Payer: Medicare PPO | Admitting: *Deleted

## 2021-07-25 DIAGNOSIS — Z794 Long term (current) use of insulin: Secondary | ICD-10-CM

## 2021-07-25 DIAGNOSIS — J449 Chronic obstructive pulmonary disease, unspecified: Secondary | ICD-10-CM

## 2021-07-25 NOTE — Patient Instructions (Signed)
Visit Information  [name], thank you for taking time to talk with me today. Please don't hesitate to contact me if I can be of assistance to you before our next scheduled telephone appointment.  Below are the goals we discussed today:  Patient Self-Care Activities: Patient [name] will: Take medications as prescribed   Attend all scheduled provider appointments  Call pharmacy for medication refills  Call provider office for new concerns or questions  Continue to follow heart healthy, low salt, low carbohydrate, low sugar diet Continue to monitor blood sugars at home- twice daily is best: first thing in the morning before eating; and then again 2 hours after a regular meal Continue your efforts in obtaining continuous glucose monitor (CGM) by working with your insurance provider and your PCP Remain active and continue his established exercise program at home, once your current symptoms of respiratory infection have resolved/ improved Keep up the great work preventing falls at home: continue using your cane as needed  Our next scheduled telephone follow up visit/ appointment with care management team member is scheduled on:  Tuesday, September 17, 2021 at 9:45 am  If you need to cancel or re-schedule our visit, please call [phone] and our care guide team will be happy to assist you.   I look forward to hearing about your progress.   Oneta Rack, RN, BSN, [facility] RN Care Coordination- [place] [phone]: direct office  If you are experiencing a Mental Health or [place] or need someone to talk to, please  call the Suicide and Crisis Lifeline: 988 call the [place] National Suicide Prevention Lifeline: 5712782564 or TTY: 302-508-4663 TTY 727 259 1572) to talk to a trained counselor call 1-800-273-TALK (toll free, 24 hour hotline) go to [facility] [address], [place]  [phone]) call 911   Patient verbalizes understanding of instructions provided today and agrees to view in [place] with COPD Being diagnosed with chronic obstructive pulmonary disease (COPD) changes your life physically and emotionally. Having COPD can affect your ability to work and do things you enjoy. COPD is not the same for everyone, and it may change over time. Your health care providers can help you come up with the COPD management plan that works best for you. How to manage lifestyle changes Treatment plan Work closely with your health care providers. Follow your COPD management plan. This plan includes: Instructions about activities, exercises, diet, medicines, what to do when COPD flares up, and when to call your health care provider. A pulmonary rehabilitation program. In pulmonary rehab, you will learn about COPD, do exercises for fitness and breathing, and get support from health care providers and other people who have COPD. Managing emotions and stress Living with a chronic disease means you may also struggle with stressful emotions, such as sadness, fear, and worry. Here are some ways to manage these emotions: Talk to someone about your fear, anxiety, depression, or stress. Learn strategies to avoid or reduce stress and ask for help if you are struggling with depression or anxiety. Consider joining a COPD support group, online or in person.  Adjusting to changes COPD may limit the things you can do, but you can make certain changes to help you cope with the diagnosis. Ask for help when you need it. Getting support from friends, family, and your health care team is an important part of managing the condition. Try to get regular exercise as prescribed by a health care  provider or pulmonary rehab team. Exercising can help COPD, even if you are a bit short of breath. Take steps to prevent infection and protect your lungs: Wash your hands often and avoid being in  crowds. Stay away from friends and family members who are sick. Check your local air quality each day, and stay out of areas where air pollution is likely. How to recognize changes in your condition Recognizing changes in your COPD COPD is a progressive disease. It is important to let the health care team know if your COPD is getting worse. Your treatment plan may need to change. Watch for: Increased shortness of breath, wheezing, cough, or fatigue. Loss of ability to exercise or perform daily activities, like climbing stairs. More frequent symptom flares. Signs of depression or anxiety. Recognizing stress It is normal to have additional stress when you have COPD. However, prolonged stress and anxiety can make COPD worse and lead to depression. Recognize the warning signs, which include: Feeling sad or worried more often or most of the time. Having less energy and losing interest in pleasurable activities. Changes in your appetite or sleeping patterns. Being easily angered or irritated. Having unexplained aches and pains, digestive problems, or headaches. Follow these instructions at home: Eating and drinking  Eat foods that are high in fiber, such as fresh fruits and vegetables, whole grains, and beans. Limit foods that are high in fat and processed sugars, such as fried or sweet foods. Follow a balanced diet and maintain a healthy weight. Being overweight or underweight can make COPD worse. You may work with a Microbiologist as part of your pulmonary rehab program. Drink enough fluid to keep your urine pale yellow. If you drink alcohol: Limit how much you have to: 0-1 drink a day for women who are not pregnant. 0-2 drinks a day for men. Know how much alcohol is in your drink. In the [place] one drink equals one 12 oz bottle of beer (355 mL), one 5 oz glass of wine (148 mL), or one 1 oz glass of hard liquor (44 mL). Lifestyle If you smoke, the most important thing that you can do is to stop  smoking. Continuing to smoke will cause the disease to progress faster. Do not use any products that contain nicotine or tobacco. These products include cigarettes, chewing tobacco, and vaping devices, such as e-cigarettes. If you need help quitting, ask your health care provider. Avoid exposure to things that irritate your lungs, such as smoke, chemicals, and fumes. Activity Balance exercise and rest. Take short walks every 1-2 hours. This is important to improve blood flow and breathing. Ask for help if you feel weak or unsteady. Do exercises that include controlled breathing with body movement, such as tai chi. General instructions Take over-the-counter and prescription medicines only as told by your health care provider. Take vitamin and protein supplements as told by your health care provider or dietitian. Practice good oral hygiene and see your dental care provider regularly. An oral infection can also spread to your lungs. Make sure you receive all the vaccines that your health care provider recommends. Keep all follow-up visits. This is important. Contact a health care provider if you: Are struggling to manage your COPD. Have emotional stress that interferes with your ability to cope with COPD. Get help right away if you: Have thoughts of suicide, death, or hurting yourself or others. If you ever feel like you may hurt yourself or others, or have thoughts about taking your own life, get  help right away. Go to your nearest emergency department or: Call your local emergency services (911 in the [place] Call a suicide crisis helpline, such as the [place] at 778-643-7714 or 988 in the [place]. This is open 24 hours a day in the [place] Text the Crisis Text Line at 207-452-7775 (in the [place].). Summary Being diagnosed with chronic obstructive pulmonary disease (COPD) changes your life physically and emotionally. Work with your health care providers and follow your COPD  management plan. A pulmonary rehabilitation program is an important part of COPD management. Prolonged stress, anxiety, and depression can make COPD worse. Let your health care provider know if emotional stress interferes with your ability to cope with and manage COPD. This information is not intended to replace advice given to you by your health care provider. Make sure you discuss any questions you have with your health care provider. Document Revised: 02/06/2021 Document Reviewed: 08/01/2020 Elsevier Patient Education  2022 [employer].

## 2021-07-25 NOTE — Chronic Care Management (AMB) (Signed)
Chronic Care Management   CCM RN Visit Note  07/25/2021 Name: [name] MRN: [id number] DOB: 07/17/1941  Subjective: [name] is a 80 y.o. year old male who is a primary care patient of Myrlene Broker, MD. The care management team was consulted for assistance with disease management and care coordination needs.    Engaged with patient by telephone for follow up visit in response to provider referral for case management and/or care coordination services.   Consent to Services:  The patient was given information about Chronic Care Management services, agreed to services, and gave verbal consent prior to initiation of services.  Please see initial visit note for detailed documentation.  Patient agreed to services and verbal consent obtained.   Assessment: Review of patient past medical history, allergies, medications, health status, including review of consultants reports, laboratory and other test data, was performed as part of comprehensive evaluation and provision of chronic care management services.   SDOH (Social Determinants of Health) assessments and interventions performed:  SDOH Interventions    Flowsheet Row Most Recent Value  SDOH Interventions   Food Insecurity Interventions Intervention Not Indicated  Transportation Interventions Intervention Not Indicated  [continues driving self,  daughter assists as indicated/ needed]      CCM Care Plan No Known Allergies  Outpatient Encounter Medications as of 07/25/2021  Medication Sig Note   albuterol (PROVENTIL) (2.5 MG/3ML) 0.083% nebulizer solution Take 3 mLs (2.5 mg total) by nebulization every 6 (six) hours as needed for wheezing or shortness of breath. 04/24/2021: 04/24/21-- reports has not needed in several years   albuterol (VENTOLIN HFA) 108 (90 Base) MCG/ACT inhaler Inhale 2 puffs into the lungs every 6 (six) hours as needed for wheezing or shortness of breath. 04/24/2021: 04/24/21- reports uses only  occasionally   amoxicillin-clavulanate (AUGMENTIN) 875-125 MG tablet Take 1 tablet by mouth 2 (two) times daily. Take with food.    atorvastatin (LIPITOR) 40 MG tablet Take 1 tablet (40 mg total) by mouth daily.    Carboxymethylcellul-Glycerin (LUBRICATING EYE DROPS OP) Place 1 drop into both eyes 4 (four) times daily as needed (dry eyes).     Cholecalciferol (VITAMIN D3) 2000 units capsule Take 2,000 Units by mouth daily.     Continuous Blood Gluc Receiver (FREESTYLE LIBRE 14 DAY READER) DEVI 1 Stick by Does not apply route daily.    empagliflozin (JARDIANCE) 10 MG TABS tablet Take 5 mg by mouth daily. Filled at [place].    fluticasone (FLONASE) 50 MCG/ACT nasal spray Place 1 spray into both nostrils daily.    furosemide (LASIX) 40 MG tablet Take 1 tablet (40 mg total) by mouth daily. (Patient taking differently: Take 40 mg by mouth daily as needed (fluid retention.).)    gabapentin (NEURONTIN) 100 MG capsule Take 1 capsule (100 mg total) by mouth 2 (two) times daily.    insulin glargine (LANTUS) 100 UNIT/ML injection Inject 45 Units into the skin at bedtime.    levothyroxine (SYNTHROID) 175 MCG tablet Take 175 mcg by mouth daily before breakfast.    losartan (COZAAR) 50 MG tablet Take 1 tablet (50 mg total) by mouth daily.    Menthol, Topical Analgesic, (BIOFREEZE EX) Apply 1 application topically 3 (three) times daily as needed (pain. (calf muscle)).    metFORMIN (GLUCOPHAGE) 500 MG tablet Take 500-1,000 mg by mouth 2 (two) times daily with a meal. Take 1 tablet (500 mg) by mouth in the morning & take 2 tablets (1000 mg) by mouth in the evening.  Multiple Vitamins-Minerals (MEGA MULTIVITAMIN FOR MEN PO) Take 1 tablet by mouth daily.    OXYGEN Inhale 2 L into the lungs continuous.    predniSONE (DELTASONE) 5 MG tablet Take 1 tablet (5 mg total) by mouth daily with breakfast.    Semaglutide (OZEMPIC, 1 MG/DOSE, [place]) Inject 1 mg into the skin every Friday.    tamsulosin (FLOMAX) 0.4 MG CAPS capsule  Take 1 capsule (0.4 mg total) by mouth at bedtime.    Tiotropium Bromide-Olodaterol (STIOLTO RESPIMAT) 2.5-2.5 MCG/ACT AERS Inhale 2 puffs into the lungs daily.    No facility-administered encounter medications on file as of 07/25/2021.   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   Claudication (HCC) 07/03/2020   Physical deconditioning 06/08/2020   Hemoptysis 02/16/2020   Syncope 10/04/2019   LBBB (left bundle branch block) 10/04/2019   Pacemaker 10/04/2019   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   Lung nodules 03/25/2019   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   Symptomatic anemia 01/20/2018   BENIGN PROSTATIC HYPERTROPHY 12/2017   Chronic respiratory failure with hypoxia (HCC) 12/01/2017   Lung mass 12/01/2017   PAF (paroxysmal atrial fibrillation) (HCC)    Ischemic cardiomyopathy 09/02/2017   Chronic systolic CHF (congestive heart failure) (HCC) 09/02/2017   Aortic aneurysm (HCC) 09/02/2017   Diabetes mellitus with complication, with long-term current use of insulin (HCC) 09/01/2017   Anxiety and depression 09/01/2017   COPD (chronic obstructive pulmonary disease) (HCC) 09/01/2017   Community acquired pneumonia 12/26/2016   CORONARY ARTERY DISEASE 08/28/2016   Myogenic ptosis of right eyelid 07/11/2016   Open angle with borderline findings, low risk, bilateral 07/11/2016   Bilateral dry eyes 04/14/2014   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/14/2014   Hyperlipidemia associated with type 2  diabetes mellitus (HCC) 01/05/2014   High risk medication use 10/07/2013   Pseudophakia of both eyes 10/07/2013   Drusen of left macula 12/01/2012   Scleritis of both eyes 12/01/2012   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   PTSD (post-traumatic stress disorder) 03/10/2012   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   Rheumatoid arthritis (HCC) 09/09/2011   Scleromalacia 09/09/2011   COPD with acute exacerbation (HCC) 08/28/2011   OSA on CPAP 06/10/2011   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   GAIT DISTURBANCE 12/10/2009   Coronary atherosclerosis 11/23/2009   Hypothyroidism 07/30/2009   Essential hypertension 07/30/2009   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 addressed/monitored:  COPD and DMII  Care Plan : RN Care Manager Plan of Care  Updates made by Michaela Corner, RN since 07/25/2021 12:00 AM     Problem: Chronic Disease Management Needs   Priority: Medium     Long-Range Goal: Development of plan of care for long term chronic disease management   Start Date: 04/24/2021  Expected End Date: 04/24/2022  Priority: Medium  Note:   Current Barriers:  Chronic Disease Management support and education needs related to COPD and DMII Fragile state of health, multiple progressing chronic health conditions- on continuous home O2 at 2-3 L/min  RNCM Clinical Goal(s):  Patient will demonstrate ongoing health management  independence with COPD, DMII, multiple chronic health issues  through collaboration with RN Care manager, provider, and care team.   Interventions: 1:1 collaboration with primary care provider regarding development and update of comprehensive plan of care as evidenced by provider attestation and co-signature Inter-disciplinary care team collaboration (see longitudinal plan of care) Evaluation of current treatment plan related to  self management and patient's adherence to plan as established by provider Confirmed no new/ recent falls since last outreach 04/24/21; continues using cane as/ if  indicated- reports mainly uses when he "leaves house;" positive reinforcement provided with encouragement to continue efforts to prevent falls  COPD: (Status: Goal on Track (progressing): YES.) Provided patient with basic written and verbal COPD education on self care/management/and exacerbation prevention Advised patient to track and manage COPD triggers Provided instruction about proper use of medications used for management of COPD including inhalers Advised patient to self assesses COPD action plan zone and make appointment with provider if in the yellow zone for 48 hours without improvement Advised patient to engage in light exercise as tolerated 3-5 days a week to aid in the the management of COPD Provided education about and advised patient to utilize infection prevention strategies to reduce risk of respiratory infection Discussed the importance of adequate rest and management of fatigue with COPD Assessed social determinant of health barriers Discussed recently reported symptoms URI: reviewed 07/17/21 pulmonary office visit and confirmed patient obtained and is/ has been taking prescribed antibiotics and prednisone; finished prednisone dose pack, has not yet finished antibiotics- taking as prescribed; taking OTC Mucinex; reports "feeling better overall," although he continues to have ongoing cough and increased phlegm- positive reinforcement provided for patient adhering to established action plan for COPD flare-- encouraged patient to continue taking medications as prescribed and to re-contact pulmonary provider if symptoms do not continue to improve/ worsen, especially once antibiotics are completed Confirmed patient continues taking maintenance and rescue inhalers as prescribed; occasionally using nebulizer as well Confirmed patient uses continuous home O2 at 2-3 L/min, has portable helios tank; uses CPAP q HS Discussed action plan for COPD flares: patient has good baseline understanding of  same; managing COPD well at home on day-to-day basis Confirmed patient obtained flu vaccine for 2022-23 flu season; also confirmed he obtained his COVID booster vaccine on same day as flu vaccine Confirmed patient remains independent, active and regularly exercises- although he has not been as active since he has had signs/ symptoms URI: positive reinforcement provided; benefits of maintaining activity and exercise routine over time discussed, encouraged patient to pace self and not over-do activity, especially while he is continuing to have symptoms of URI Confirmed patient follows low salt diet- positive reinforcement provided  Diabetes:  (Status: Goal on Track (progressing): YES.) Lab Results  Component Value Date   HGBA1C 10.3 (H) 08/21/2020  Provided education to patient about basic DM disease process; Reviewed prescribed diet with patient low sugar/ carbohydrate, low salt, heart healthy; Counseled on importance of regular laboratory monitoring as prescribed;        Discussed plans with patient for ongoing care management follow up and provided patient with direct contact information for care management team;      Reviewed scheduled/upcoming provider appointments including: 08/28/21- pulmonary provider; 09/09/21- PCP; 09/11/21- cardiology provider;         Review of patient status, including review of consultants reports, relevant laboratory and other test results, and medications completed;       Advised patient to discuss ongoing desire to obtain CGM  with provider;      Confirmed patient continues working with insurance provider to obtain CGM: reports he is awaiting paperwork to be "finalized" with insurance company- spoke with insurance rep yesterday who informed him that paperwork for Dexcon CGM authorization has been sent to PCP office: encouraged him to maintain communication with both insurance provider and PCP office if he does not hear back in timely manner- he will do Confirmed patient  continues to follow his current established home blood sugar monitoring routine: reports he "checks as often as he can" around ongoing issues obtaining blood from fingers with sticks- states his fingers are sore and do not give him much blood; tries to check QD, however sometimes misses checking Declines review of specific blood sugars today-- states "they are still really high like they always are;" he understands steroid therapy will increase blood sugars Discussed/ provided verbal education around benefits of exercise in setting of DMII/ COPD- positive reinforcement provided for patient staying regularly active/ following exercise routine at home, once he begins to feel better after having current URI symptoms  Patient Goals/Self-Care Activities: As evidenced by review of EHR, collaboration with care team, and patient reporting during CCM RN CM outreach,  Patient Cordae will: Take medications as prescribed   Attend all scheduled provider appointments  Call pharmacy for medication refills  Call provider office for new concerns or questions  Continue to follow heart healthy, low salt, low carbohydrate, low sugar diet Continue to monitor blood sugars at home- twice daily is best: first thing in the morning before eating; and then again 2 hours after a regular meal Continue your efforts in obtaining continuous glucose monitor (CGM) by working with your insurance provider and your PCP Remain active and continue his established exercise program at home, once your current symptoms of respiratory infection have resolved/ improved Keep up the great work preventing falls at home: continue using your cane as needed    Plan: Telephone follow up appointment with care management team member scheduled for:  Tuesday, September 17, 2021 at 9:45 am The patient has been provided with contact information for the care management team and has been advised to call with any health related questions or  concerns  [name], RN, BSN, CCRN Alumnus [facility] RN Care Coordination- [facility] [phone]: direct office

## 2021-07-26 NOTE — Telephone Encounter (Signed)
Pt has been made aware that he doesn't qualify for this product.

## 2021-07-27 DIAGNOSIS — Z794 Long term (current) use of insulin: Secondary | ICD-10-CM | POA: Diagnosis not present

## 2021-07-27 DIAGNOSIS — J449 Chronic obstructive pulmonary disease, unspecified: Secondary | ICD-10-CM | POA: Diagnosis not present

## 2021-07-27 DIAGNOSIS — E118 Type 2 diabetes mellitus with unspecified complications: Secondary | ICD-10-CM

## 2021-07-30 NOTE — Telephone Encounter (Signed)
Rep w/ [facility] checking status of dexcom authorization request  Rep sates fax has been sent multiple times, last being 07-24-2021  Rep states she will refax from today 07-30-2021

## 2021-08-01 NOTE — Telephone Encounter (Signed)
Pt calling in requesting an update on the application for the Dexcom Glucose meter. He states that [facility] called this morning stating that they will cancel his order without this application from Dr. Okey Dupre.   Pt is requesting a call back as soon as possible as he is unsure of what to do next.   Pt number [phone]

## 2021-08-05 ENCOUNTER — Encounter: Payer: Self-pay | Admitting: Internal Medicine

## 2021-08-06 ENCOUNTER — Ambulatory Visit (INDEPENDENT_AMBULATORY_CARE_PROVIDER_SITE_OTHER): Payer: Medicare PPO | Admitting: *Deleted

## 2021-08-06 DIAGNOSIS — J449 Chronic obstructive pulmonary disease, unspecified: Secondary | ICD-10-CM

## 2021-08-06 DIAGNOSIS — E118 Type 2 diabetes mellitus with unspecified complications: Secondary | ICD-10-CM

## 2021-08-06 DIAGNOSIS — Z794 Long term (current) use of insulin: Secondary | ICD-10-CM

## 2021-08-06 NOTE — Chronic Care Management (AMB) (Signed)
Chronic Care Management   CCM RN Visit Note  08/06/2021 Name: [name] MRN: [id number] DOB: July 15, 1941  Subjective: [name] is a 81 y.o. year old male who is a primary care patient of [name], MD. The care management team was consulted for assistance with disease management and care coordination needs.    Engaged with patient by telephone for  acute/ unscheduled outreach  in response to provider referral for case management and/or care coordination services.   Consent to Services:  The patient was given information about Chronic Care Management services, agreed to services, and gave verbal consent prior to initiation of services.  Please see initial visit note for detailed documentation.  Patient agreed to services and verbal consent obtained.   Assessment: Review of patient past medical history, allergies, medications, health status, including review of consultants reports, laboratory and other test data, was performed as part of comprehensive evaluation and provision of chronic care management services.   CCM Care Plan No Known Allergies  Outpatient Encounter Medications as of 08/06/2021  Medication Sig Note   albuterol (PROVENTIL) (2.5 MG/3ML) 0.083% nebulizer solution Take 3 mLs (2.5 mg total) by nebulization every 6 (six) hours as needed for wheezing or shortness of breath. 04/24/2021: 04/24/21-- reports has not needed in several years   albuterol (VENTOLIN HFA) 108 (90 Base) MCG/ACT inhaler Inhale 2 puffs into the lungs every 6 (six) hours as needed for wheezing or shortness of breath. 04/24/2021: 04/24/21- reports uses only occasionally   amoxicillin-clavulanate (AUGMENTIN) 875-125 MG tablet Take 1 tablet by mouth 2 (two) times daily. Take with food.    atorvastatin (LIPITOR) 40 MG tablet Take 1 tablet (40 mg total) by mouth daily.    Carboxymethylcellul-Glycerin (LUBRICATING EYE DROPS OP) Place 1 drop into both eyes 4 (four) times daily as needed (dry  eyes).     Cholecalciferol (VITAMIN D3) 2000 units capsule Take 2,000 Units by mouth daily.     Continuous Blood Gluc Receiver (FREESTYLE LIBRE 14 DAY READER) DEVI 1 Stick by Does not apply route daily.    empagliflozin (JARDIANCE) 10 MG TABS tablet Take 5 mg by mouth daily. Filled at [place].    fluticasone (FLONASE) 50 MCG/ACT nasal spray Place 1 spray into both nostrils daily.    furosemide (LASIX) 40 MG tablet Take 1 tablet (40 mg total) by mouth daily. (Patient taking differently: Take 40 mg by mouth daily as needed (fluid retention.).)    gabapentin (NEURONTIN) 100 MG capsule Take 1 capsule (100 mg total) by mouth 2 (two) times daily.    insulin glargine (LANTUS) 100 UNIT/ML injection Inject 45 Units into the skin at bedtime.    levothyroxine (SYNTHROID) 175 MCG tablet Take 175 mcg by mouth daily before breakfast.    losartan (COZAAR) 50 MG tablet Take 1 tablet (50 mg total) by mouth daily.    Menthol, Topical Analgesic, (BIOFREEZE EX) Apply 1 application topically 3 (three) times daily as needed (pain. (calf muscle)).    metFORMIN (GLUCOPHAGE) 500 MG tablet Take 500-1,000 mg by mouth 2 (two) times daily with a meal. Take 1 tablet (500 mg) by mouth in the morning & take 2 tablets (1000 mg) by mouth in the evening.    Multiple Vitamins-Minerals (MEGA MULTIVITAMIN FOR MEN PO) Take 1 tablet by mouth daily.    OXYGEN Inhale 2 L into the lungs continuous.    predniSONE (DELTASONE) 5 MG tablet Take 1 tablet (5 mg total) by mouth daily with breakfast.    Semaglutide (  OZEMPIC, 1 MG/DOSE, [place]) Inject 1 mg into the skin every Friday.    tamsulosin (FLOMAX) 0.4 MG CAPS capsule Take 1 capsule (0.4 mg total) by mouth at bedtime.    Tiotropium Bromide-Olodaterol (STIOLTO RESPIMAT) 2.5-2.5 MCG/ACT AERS Inhale 2 puffs into the lungs daily.    No facility-administered encounter medications on file as of 08/06/2021.   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   Claudication ([place]) 07/03/2020   Physical deconditioning 06/08/2020   Hemoptysis 02/16/2020   Syncope 10/04/2019   LBBB (left bundle branch block) 10/04/2019   Pacemaker 10/04/2019   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   Lung nodules 03/25/2019   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   Symptomatic anemia 01/20/2018   BENIGN PROSTATIC HYPERTROPHY 12/2017   Chronic respiratory failure with hypoxia ([place]) 12/01/2017   Lung mass 12/01/2017   PAF (paroxysmal atrial fibrillation) ([place])    Ischemic cardiomyopathy [id number]   Chronic systolic CHF (congestive heart failure) ([place]) 09/02/2017   Aortic aneurysm ([place]) 09/02/2017   Diabetes mellitus with complication, with long-term current use of insulin ([place]) 09/01/2017   Anxiety and depression 09/01/2017   COPD (chronic obstructive pulmonary disease) ([place]) 09/01/2017   Community acquired pneumonia 12/26/2016   CORONARY ARTERY DISEASE 08/28/2016   Myogenic ptosis of right eyelid 07/11/2016   Open angle with borderline findings, low risk, bilateral 07/11/2016   Bilateral dry eyes 04/14/2014   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/14/2014   Hyperlipidemia associated with type 2 diabetes mellitus ([place]) 01/05/2014   High risk medication use 10/07/2013   Pseudophakia of both eyes 10/07/2013   [place] of left macula 12/01/2012   Scleritis of both eyes 12/01/2012   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   PTSD (post-traumatic stress disorder) 03/10/2012   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    Rheumatoid arthritis ([place]) 09/09/2011   Scleromalacia 09/09/2011   COPD with acute exacerbation ([place]) 08/28/2011   OSA on CPAP 06/10/2011   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   GAIT DISTURBANCE 12/10/2009   Coronary atherosclerosis 11/23/2009   Hypothyroidism 07/30/2009   Essential hypertension 07/30/2009   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 addressed/monitored:  COPD and DMII  Care Plan : RN Care Manager Plan of Care  Updates made by Knox Royalty, RN since 08/06/2021 12:00 AM     Problem: Chronic Disease Management Needs   Priority: Medium     Long-Range Goal: Development of plan of care for long term chronic disease management   Start Date: 04/24/2021  Expected End Date: 04/24/2022  Priority: Medium  Note:   Current Barriers:  Chronic Disease Management support and education needs related to COPD and DMII Fragile state of health, multiple progressing chronic health conditions- on continuous home O2 at 2-3 L/min  RNCM Clinical Goal(s):  Patient will demonstrate ongoing health management independence with COPD, DMII, multiple chronic health issues  through collaboration with RN Care manager, provider, and care team.   Interventions: 1:1 collaboration with primary care provider regarding development and update of comprehensive plan of care as evidenced by provider attestation and co-signature Inter-disciplinary care team  collaboration (see longitudinal plan of care) Evaluation of current treatment plan related to  self management and patient's adherence to plan as established by provider Confirmed no new/ recent falls since last outreach 04/24/21; continues using cane as/ if indicated- reports mainly uses when he "leaves house;" positive reinforcement provided with encouragement to continue efforts to prevent falls  08/06/21: Acute/ unscheduled incoming call from patient; he requests follow up by voice call regarding status of his dexcom CGM- states that he has been in touch with his insurance provider  ([employer])-- who are telling him that they can not approve CGM request until appropriate forms are completed by PCP office staff; he reports that his insurance provider told him they have faxed the forms 5-plus times to PCP office, without receiving necessary forms for authorization back; review of EHR indicate that office staff are working on this, but have not received any forms- patient updated accordingly.  Patient requests that office staff contact him with a VOICE CALL to update him on fax number form should be sent to, and to provide basic follow up on his request, as to what he should he expect moving forward.  Instant messaging through EHR immediately sent to Maryelizabeth Kaufmann, [place] who has been working on this, requesting that patient be contacted by phone to have his questions answered.  Encouraged patient to listen out for phone call from Dr. Nathanial Millman office team, patient stated he would keep his phone nearby.     From last outreach 07/25/21:  COPD: (Status: Goal on Track (progressing): YES.) Provided patient with basic written and verbal COPD education on self care/management/and exacerbation prevention Advised patient to track and manage COPD triggers Provided instruction about proper use of medications used for management of COPD including inhalers Advised patient to self assesses COPD action plan zone and make appointment with provider if in the yellow zone for 48 hours without improvement Advised patient to engage in light exercise as tolerated 3-5 days a week to aid in the the management of COPD Provided education about and advised patient to utilize infection prevention strategies to reduce risk of respiratory infection Discussed the importance of adequate rest and management of fatigue with COPD Assessed social determinant of health barriers Discussed recently reported symptoms URI: reviewed 07/17/21 pulmonary office visit and confirmed patient obtained and is/ has  been taking prescribed antibiotics and prednisone; finished prednisone dose pack, has not yet finished antibiotics- taking as prescribed; taking OTC Mucinex; reports "feeling better overall," although he continues to have ongoing cough and increased phlegm- positive reinforcement provided for patient adhering to established action plan for COPD flare-- encouraged patient to continue taking medications as prescribed and to re-contact pulmonary provider if symptoms do not continue to improve/ worsen, especially once antibiotics are completed Confirmed patient continues taking maintenance and rescue inhalers as prescribed; occasionally using nebulizer as well Confirmed patient uses continuous home O2 at 2-3 L/min, has portable helios tank; uses CPAP q HS Discussed action plan for COPD flares: patient has good baseline understanding of same; managing COPD well at home on day-to-day basis Confirmed patient obtained flu vaccine for 2022-23 flu season; also confirmed he obtained his COVID booster vaccine on same day as flu vaccine Confirmed patient remains independent, active and regularly exercises- although he has not been as active since he has had signs/ symptoms URI: positive reinforcement provided; benefits of maintaining activity and exercise routine over time discussed, encouraged patient to pace self and not over-do activity, especially while he is  continuing to have symptoms of URI Confirmed patient follows low salt diet- positive reinforcement provided  Diabetes:  (Status: Goal on Track (progressing): YES.) Lab Results  Component Value Date   HGBA1C 10.3 (H) 08/21/2020  Provided education to patient about basic DM disease process; Reviewed prescribed diet with patient low sugar/ carbohydrate, low salt, heart healthy; Counseled on importance of regular laboratory monitoring as prescribed;        Discussed plans with patient for ongoing care management follow up and provided patient with direct  contact information for care management team;      Reviewed scheduled/upcoming provider appointments including: 08/28/21- pulmonary provider; 09/09/21- PCP; 09/11/21- cardiology provider;         Review of patient status, including review of consultants reports, relevant laboratory and other test results, and medications completed;       Advised patient to discuss ongoing desire to obtain CGM with provider;      Confirmed patient continues working with insurance provider to obtain CGM: reports he is awaiting paperwork to be "finalized" with insurance company- spoke with insurance rep yesterday who informed him that paperwork for [place] CGM authorization has been sent to PCP office: encouraged him to maintain communication with both insurance provider and PCP office if he does not hear back in timely manner- he will do Confirmed patient continues to follow his current established home blood sugar monitoring routine: reports he "checks as often as he can" around ongoing issues obtaining blood from fingers with sticks- states his fingers are sore and do not give him much blood; tries to check QD, however sometimes misses checking Declines review of specific blood sugars today-- states "they are still really high like they always are;" he understands steroid therapy will increase blood sugars Discussed/ provided verbal education around benefits of exercise in setting of DMII/ COPD- positive reinforcement provided for patient staying regularly active/ following exercise routine at home, once he begins to feel better after having current URI symptoms  Patient Goals/Self-Care Activities: As evidenced by review of EHR, collaboration with care team, and patient reporting during CCM RN CM outreach,  Patient [place] will: Take medications as prescribed   Attend all scheduled provider appointments  Call pharmacy for medication refills  Call provider office for new concerns or questions  Continue to follow heart  healthy, low salt, low carbohydrate, low sugar diet Continue to monitor blood sugars at home- twice daily is best: first thing in the morning before eating; and then again 2 hours after a regular meal Continue your efforts in obtaining continuous glucose monitor (CGM) by working with your insurance provider and your PCP Remain active and continue his established exercise program at home, once your current symptoms of respiratory infection have resolved/ improved Keep up the great work preventing falls at home: continue using your cane as needed    Plan: Telephone follow up appointment with care management team member scheduled for:  Tuesday, September 17, 2021 at 9:45 am The patient has been provided with contact information for the care management team and has been advised to call with any health related questions or concerns   Oneta Rack, RN, BSN, [place] [phone]: direct office

## 2021-08-06 NOTE — Patient Instructions (Addendum)
Visit Information  [name], thank you for taking time to talk with me today. Please don't hesitate to contact me if I can be of assistance to you before our next scheduled telephone appointment.  As you have requested today-- I have asked Dr. Frutoso Chase CMA to contact you by phone to discuss your questions about obtaining the Dexcom/ CGM from your insurance company.  Please listen out for a call from Dr. Frutoso Chase office team.  Below are the goals we discussed today:  Patient Self-Care Activities: Take medications as prescribed   Attend all scheduled provider appointments  Call pharmacy for medication refills  Call provider office for new concerns or questions  Continue to follow heart healthy, low salt, low carbohydrate, low sugar diet Continue to monitor blood sugars at home- twice daily is best: first thing in the morning before eating; and then again 2 hours after a regular meal Continue your efforts in obtaining continuous glucose monitor (CGM) by working with your insurance provider and your PCP Remain active and continue his established exercise program at home, once your current symptoms of respiratory infection have resolved/ improved Keep up the great work preventing falls at home: continue using your cane as needed  Our next scheduled telephone follow up visit/ appointment with care management team member is scheduled on:  Tuesday, September 17, 2021 at 9:45 am- This is a PHONE CALL appointment  If you need to cancel or re-schedule our visit, please call [phone] and our care guide team will be happy to assist you.   I look forward to hearing about your progress.   [name], RN, BSN, CCRN Alumnus [facility] RN Care Coordination- [facility] [phone]: direct office  If you are experiencing a Mental Health or Behavioral Health Crisis or need someone to talk to, please  call the Suicide and Crisis Lifeline: 988 call the [place] National Suicide  Prevention Lifeline: 508 541 6246 or TTY: 716-180-8153 TTY 225-168-2366) to talk to a trained counselor call 1-800-273-TALK (toll free, 24 hour hotline) go to [facility] [address], [place] [phone]) call 911   Patient verbalizes understanding of instructions provided today and agrees to view in MyChart

## 2021-08-26 NOTE — Telephone Encounter (Signed)
I am placing the faxed order in Dr. Okey Dupre box now for the [facility].

## 2021-08-27 DIAGNOSIS — J449 Chronic obstructive pulmonary disease, unspecified: Secondary | ICD-10-CM | POA: Diagnosis not present

## 2021-08-27 DIAGNOSIS — E118 Type 2 diabetes mellitus with unspecified complications: Secondary | ICD-10-CM | POA: Diagnosis not present

## 2021-08-27 DIAGNOSIS — Z794 Long term (current) use of insulin: Secondary | ICD-10-CM | POA: Diagnosis not present

## 2021-08-28 ENCOUNTER — Encounter: Payer: Self-pay | Admitting: Pulmonary Disease

## 2021-08-28 ENCOUNTER — Ambulatory Visit: Payer: Medicare PPO | Admitting: Pulmonary Disease

## 2021-08-28 ENCOUNTER — Other Ambulatory Visit: Payer: Self-pay

## 2021-08-28 DIAGNOSIS — J9611 Chronic respiratory failure with hypoxia: Secondary | ICD-10-CM

## 2021-08-28 DIAGNOSIS — Z9989 Dependence on other enabling machines and devices: Secondary | ICD-10-CM | POA: Diagnosis not present

## 2021-08-28 DIAGNOSIS — I7121 Aneurysm of the ascending aorta, without rupture: Secondary | ICD-10-CM

## 2021-08-28 DIAGNOSIS — J449 Chronic obstructive pulmonary disease, unspecified: Secondary | ICD-10-CM

## 2021-08-28 DIAGNOSIS — R918 Other nonspecific abnormal finding of lung field: Secondary | ICD-10-CM | POA: Diagnosis not present

## 2021-08-28 DIAGNOSIS — G4733 Obstructive sleep apnea (adult) (pediatric): Secondary | ICD-10-CM | POA: Diagnosis not present

## 2021-08-28 NOTE — Progress Notes (Signed)
° °  Subjective:    Patient ID: [name], male    DOB: 1941/04/02, 81 y.o.   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   CT Imaging-left upper lobe consolidation 12/2016 which resolved with scarring  06/2021 reviewed video visit >> Rx with augmentin + pred for exac  Reviewed previous PCP and cardiology consultation. Last FEV1 noted was 36%.  He had no hospitalizations over the past year but had to 3 COPD exacerbations requiring outpatient therapy. He remains compliant with 2 L of oxygen.  Stays mostly at home, his daughter helps out and takes him to the store. He is compliant with CPAP and even uses it during a nap, no problems with mask or pressure.  Remains on prednisone for low platelets Reviewed oncology consultation  Remains on Stiolto, decrease use of albuterol We reviewed prior PFTs and CT  Significant tests/ events reviewed  PSG 04/2014 showed severe OSA, AHI 48/h with nadir desatn 78% correctd by CPAP 12 cm, 3 L O2 blended in , c flex +2 cm, humidity, A medium full face mask was used.  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  PFT 10/2015 FEV1 was 36%, ratio 46, FVC 58%, DLCO 26%       CT chest 09/2020 severe emphysema, extensive pleural calcification.  Previous nodule in the right lower lobe has significantly decreased in size measuring 0.8 x 0.8 cm previously at 2.3 cm.  A stable left upper lobe cavitary nodule measuring 4 mm.  And a stable irregular  nodule in the left lower lobe measuring 1.5 cm, and a left lower lobe nodule measuring 1.5 cm -aortic aneurysm 4.2 x 4.0 cm   Review of Systems neg for any significant sore throat, dysphagia, itching, sneezing, nasal congestion or excess/ purulent secretions, fever, chills, sweats, unintended wt loss, pleuritic or exertional cp, hempoptysis, orthopnea pnd or change in chronic leg swelling. Also denies presyncope, palpitations, heartburn, abdominal pain, nausea, vomiting, diarrhea or change in bowel or urinary habits, dysuria,hematuria, rash, arthralgias, visual complaints, headache, numbness weakness or ataxia.     Objective:   Physical Exam   Gen. Pleasant, elderly, well-nourished, in no distress ENT - no thrush, no pallor/icterus,no post nasal drip Neck: No JVD, no thyromegaly, no carotid bruits Lungs: no use of accessory muscles, no dullness to percussion, decreased breath sounds bilateral without rales or rhonchi  Cardiovascular: Rhythm regular, heart sounds  normal, no murmurs or gallops, no peripheral edema Musculoskeletal: No deformities, no cyanosis or clubbing         Assessment & Plan:

## 2021-08-28 NOTE — Assessment & Plan Note (Signed)
Continue 2 L of oxygen during exertion and sleep. He has participated in the past in the center-based program

## 2021-08-28 NOTE — Assessment & Plan Note (Signed)
Ideally should be on triple therapy but since he is on oral prednisone, okay to continue Stiolto for now.  Eventually if prednisone is tapered off by hematology, then we can consider increasing to triple therapy such as Breztri or Trelegy  Once again our plans for COPD exacerbation has worked and he is good about calling early should he have a chest cold to be treated with antibiotics and increased dose of prednisone

## 2021-08-28 NOTE — Assessment & Plan Note (Signed)
1 year follow-up of aortic aneurysm with CT scan.  This will allow [place] to also look at pulmonary nodules

## 2021-08-28 NOTE — Patient Instructions (Addendum)
°  X CT chest angio aorta in march 2023 at [facility]  CPAP is working well

## 2021-08-28 NOTE — Assessment & Plan Note (Signed)
New nodule noted on last CT March 2022. 1 year follow-up will be scheduled

## 2021-08-28 NOTE — Assessment & Plan Note (Signed)
CPAP download was reviewed which shows excellent control of events on auto CPAP 10 to 20 cm with average pressure of 12 cm and mild leak.  He is very compliant and CPAP is certainly helped improve his daytime somnolence and fatigue  Weight loss encouraged, compliance with goal of at least 4-6 hrs every night is the expectation. Advised against medications with sedative side effects Cautioned against driving when sleepy - understanding that sleepiness will vary on a day to day basis

## 2021-08-29 ENCOUNTER — Inpatient Hospital Stay: Payer: Medicare PPO | Attending: Hematology and Oncology

## 2021-08-29 ENCOUNTER — Telehealth: Payer: Self-pay | Admitting: Internal Medicine

## 2021-08-29 DIAGNOSIS — D693 Immune thrombocytopenic purpura: Secondary | ICD-10-CM | POA: Diagnosis not present

## 2021-08-29 LAB — CBC WITH DIFFERENTIAL (CANCER CENTER ONLY)
Abs Immature Granulocytes: 0.05 10*3/uL (ref 0.00–0.07)
Basophils Absolute: 0 10*3/uL (ref 0.0–0.1)
Basophils Relative: 1 %
Eosinophils Absolute: 0.3 10*3/uL (ref 0.0–0.5)
Eosinophils Relative: 4 %
HCT: 40.5 % (ref 39.0–52.0)
Hemoglobin: 12.9 g/dL — ABNORMAL LOW (ref 13.0–17.0)
Immature Granulocytes: 1 %
Lymphocytes Relative: 18 %
Lymphs Abs: 1.2 10*3/uL (ref 0.7–4.0)
MCH: 26.9 pg (ref 26.0–34.0)
MCHC: 31.9 g/dL (ref 30.0–36.0)
MCV: 84.6 fL (ref 80.0–100.0)
Monocytes Absolute: 0.6 10*3/uL (ref 0.1–1.0)
Monocytes Relative: 9 %
Neutro Abs: 4.6 10*3/uL (ref 1.7–7.7)
Neutrophils Relative %: 67 %
Platelet Count: 101 10*3/uL — ABNORMAL LOW (ref 150–400)
RBC: 4.79 MIL/uL (ref 4.22–5.81)
RDW: 15 % (ref 11.5–15.5)
WBC Count: 6.8 10*3/uL (ref 4.0–10.5)
nRBC: 0 % (ref 0.0–0.2)

## 2021-08-29 NOTE — Telephone Encounter (Signed)
Paperwork has been refaxed to the number provided

## 2021-08-29 NOTE — Telephone Encounter (Signed)
[facility] [facility] calling in  Says they did not receive the most recent office visit notes w/ fax that was sent yesterday  Please fax over to [phone]

## 2021-08-30 NOTE — Progress Notes (Signed)
HPI:FU CAD. 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 Carotid dopplers 2/19 showed no significant stenosis. Admitted with sepsis 2/19 and also with atrial fibrillation, converted spontaneously to sinus; TSH low and synthroid adjusted. Placed on amiodarone. Amiodarone discontinued at previous office visit. Echocardiogram August 2020 showed normal LV function. Had pacemaker placed 12/20 due to history of syncope and pauses noted on monitor. 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 Apixaban on hold. CTA August 2021 showed dilated aortic root at 4 cm, abdominal aorta measuring 3.4 cm, left renal artery measuring 2.1 cm and right internal iliac artery measuring 2 cm.  ABIs May 2022 normal; total occlusion of the anterior tibial artery with patent left femoropopliteal bypass graft noted.  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  Peripheral vascular disease followed by vascular surgery.  Since last seen, he has chronic dyspnea on exertion but no orthopnea or PND.  No chest pain or syncope.  Occasional minimal pedal edema.  Current Outpatient Medications  Medication Sig Dispense Refill   albuterol (PROVENTIL) (2.5 MG/3ML) 0.083% nebulizer solution Take 3 mLs (2.5 mg total) by nebulization every 6 (six) hours as needed for wheezing or shortness of breath. 120 mL 12   albuterol (VENTOLIN HFA) 108 (90 Base) MCG/ACT inhaler Inhale 2 puffs into the lungs every 6 (six) hours as needed for wheezing or shortness of breath. 8 g 6   atorvastatin (LIPITOR) 40 MG tablet Take 1 tablet (40 mg total) by mouth daily. 90 tablet 3   Carboxymethylcellul-Glycerin (LUBRICATING EYE DROPS OP) Place 1 drop into both eyes 4 (four) times daily as needed (dry eyes).      Cholecalciferol (VITAMIN D3) 2000 units capsule Take 2,000 Units by mouth daily.      Continuous Blood Gluc Receiver (FREESTYLE LIBRE 14 DAY  READER) DEVI 1 Stick by Does not apply route daily. 1 each 5   empagliflozin (JARDIANCE) 10 MG TABS tablet Take 5 mg by mouth daily. Filled at [place].     fluticasone (FLONASE) 50 MCG/ACT nasal spray Place 1 spray into both nostrils daily. 18.2 mL 2   insulin glargine (LANTUS) 100 UNIT/ML injection Inject 45 Units into the skin at bedtime.     levothyroxine (SYNTHROID) 175 MCG tablet Take 175 mcg by mouth daily before breakfast.     losartan (COZAAR) 50 MG tablet Take 1 tablet (50 mg total) by mouth daily. 90 tablet 3   Menthol, Topical Analgesic, (BIOFREEZE EX) Apply 1 application topically 3 (three) times daily as needed (pain. (calf muscle)).     metFORMIN (GLUCOPHAGE) 500 MG tablet Take 500-1,000 mg by mouth 2 (two) times daily with a meal. Take 1 tablet (500 mg) by mouth in the morning & take 2 tablets (1000 mg) by mouth in the evening.     Multiple Vitamins-Minerals (MEGA MULTIVITAMIN FOR MEN PO) Take 1 tablet by mouth daily.     OXYGEN Inhale 2 L into the lungs continuous.     predniSONE (DELTASONE) 5 MG tablet Take 1 tablet (5 mg total) by mouth daily with breakfast. 90 tablet 3   Semaglutide (OZEMPIC, 1 MG/DOSE, [place]) Inject 1 mg into the skin every Friday.     tamsulosin (FLOMAX) 0.4 MG CAPS capsule Take 1 capsule (0.4 mg total) by mouth at bedtime. 30 capsule 0   Tiotropium Bromide-Olodaterol (STIOLTO RESPIMAT) 2.5-2.5 MCG/ACT  AERS Inhale 2 puffs into the lungs daily. 4 g 0   furosemide (LASIX) 40 MG tablet Take 1 tablet (40 mg total) by mouth daily. (Patient taking differently: Take 40 mg by mouth daily as needed (fluid retention.).) 90 tablet 3   gabapentin (NEURONTIN) 100 MG capsule Take 1 capsule (100 mg total) by mouth 2 (two) times daily. 90 capsule 0   No current facility-administered medications for this visit.     Past Medical History:  Diagnosis Date   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   ablated   Anxiety    Aortic aneurysm ([place]) 09/02/2017   BENIGN PROSTATIC HYPERTROPHY  11/23/2009   Cardiomyopathy ([place]) [id number]   Chronic systolic CHF (congestive heart failure) ([place]) 09/02/2017   COPD (chronic obstructive pulmonary disease) ([place])    CORONARY ARTERY DISEASE 11/23/2009   DECREASED HEARING, 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   DEGENERATIVE JOINT DISEASE 11/23/2009   DEPRESSION 11/23/2009   FATIGUE 11/23/2009   GAIT DISTURBANCE 12/10/2009   HEMOPTYSIS UNSPECIFIED 05/07/2010   High cholesterol    HYPERTENSION 07/30/2009   HYPOTHYROIDISM 07/30/2009   Ischemic cardiomyopathy 09/02/2017   LUMBAR RADICULOPATHY, 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   On home oxygen therapy    "2-3L; 24/7" (09/10/2016)   OSA on CPAP    Pneumonia    PTSD (post-traumatic stress disorder) 03/10/2012   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   RA (rheumatoid arthritis) ([place]) 06/11/2011   "qwhere" (09/10/2016)   RESPIRATORY FAILURE, CHRONIC 07/31/2009   Scleritis of both eyes 03/17/2014   Thrombocytopenia ([place])    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   Type II diabetes mellitus ([place])     Past Surgical History:  Procedure Laterality Date   ABDOMINAL AORTIC ANEURYSM REPAIR  07/2002   Archie Endo 12/10/2010   ABDOMINAL AORTOGRAM W/LOWER EXTREMITY N/A 08/16/2020   Procedure: ABDOMINAL AORTOGRAM W/LOWER EXTREMITY;  Surgeon: [name], MD;  Location: [facility];  Service: Cardiovascular;  Laterality: N/A;   ABDOMINAL EXPLORATION SURGERY  02/2004   w/LOA/notes 12/10/2010; small bowel obstruction repair with adhesiolysis    BACK SURGERY     CARDIAC CATHETERIZATION     2 heart caths in the past.  One in 2000s showed one ulcerated plaque  Rx medically; [facility] /notes 09/05/2016   CATARACT EXTRACTION W/ INTRAOCULAR LENS  IMPLANT, BILATERAL Bilateral 2000s   COLECTOMY     hx of remote ileum resection due to bleeding   COLONOSCOPY WITH PROPOFOL N/A 01/22/2018   Procedure: COLONOSCOPY WITH PROPOFOL;  Surgeon: Gatha Mayer, MD;  Location: [facility];  Service: Endoscopy;  Laterality: N/A;   CORONARY ANGIOPLASTY WITH STENT PLACEMENT  09/10/2016    CORONARY STENT INTERVENTION N/A 09/10/2016   Procedure: Coronary Stent Intervention;  Surgeon: Burnell Blanks, MD;  Location: [place] [facility];  Service: Cardiovascular;  Laterality: N/A;  Distal RCA 4.0x16 Synergy   ESOPHAGOGASTRODUODENOSCOPY (EGD) WITH PROPOFOL N/A 01/22/2018   Procedure: ESOPHAGOGASTRODUODENOSCOPY (EGD) WITH PROPOFOL;  Surgeon: Gatha Mayer, MD;  Location: [facility];  Service: Endoscopy;  Laterality: N/A;   EYE SURGERY     FALSE ANEURYSM REPAIR Left 08/22/2020   Procedure: EXCLUSION OF LEFT POPLITEAL ARTERY ANEURYSM;  Surgeon: [name], MD;  Location: [place];  Service: Vascular;  Laterality: Left;   FEMORAL EMBOLOECTOMY Left 07/2000   with left leg ischemia; Dr. Kellie Simmering, vascular   FEMORAL-POPLITEAL BYPASS GRAFT Bilateral 08/22/2020   Procedure: LEFT FEMORAL-POSTERIOR TIBIAL ARTERY BYPASS GRAFT USING RIGHT GREATER NONREVERSED SAPHENOUS VEIN GRAFT;  Surgeon: [name], MD;  Location: [place];  Service:  Vascular;  Laterality: Bilateral;   GANGLION CYST EXCISION Right    "wrist"; Dr. Teressa Senter   HOT HEMOSTASIS N/A 01/22/2018   Procedure: HOT HEMOSTASIS (ARGON PLASMA COAGULATION/BICAP);  Surgeon: Iva Boop, MD;  Location: [facility];  Service: Endoscopy;  Laterality: N/A;   LOOP RECORDER INSERTION N/A 04/25/2019   Procedure: LOOP RECORDER INSERTION;  Surgeon: Duke Salvia, MD;  Location: [facility] INVAS[facility];  Service: Cardiovascular;  Laterality: N/A;   LOOP RECORDER REMOVAL N/A 07/01/2019   Procedure: LOOP RECORDER REMOVAL;  Surgeon: Duke Salvia, MD;  Location: [facility] IN[facility]AS[facility];  Service: Cardiovascular;  Laterality: N/A;   LUMBAR LAMINECTOMY  1972   Dr. Fannie Knee   PACEMAKER IMPLANT N/A 07/01/2019   Procedure: PACEMAKER IMPLANT;  Surgeon: Duke Salvia, MD;  Location: [facility] INVASIVE CV LAB;  Service: Cardiovascular;  Laterality: N/A;   RIGHT/LEFT HEART CATH AND CORONARY ANGIOGRAPHY N/A 09/10/2016   Procedure: Right/Left Heart Cath and Coronary  Angiography;  Surgeon: Kathleene Hazel, MD;  Location: [facility] INVASIVE CV LAB;  Service: Cardiovascular;  Laterality: N/A;   TONSILLECTOMY      Social History   Socioeconomic History   Marital status: Widowed    Spouse name: Not on file   Number of children: Not on file   Years of education: Not on file   Highest education level: Not on file  Occupational History   Occupation: disabled veteran, [employer]    Employer: RETIRED  Tobacco Use   Smoking status: Former    Packs/day: 2.50    Years: 40.00    Pack years: 100.00    Types: Cigarettes, Pipe, Cigars    Quit date: 07/28/1998    Years since quitting: 23.1   Smokeless tobacco: Never  Vaping Use   Vaping Use: Never used  Substance and Sexual Activity   Alcohol use: No    Alcohol/week: 0.0 standard drinks   Drug use: No   Sexual activity: Not Currently  Other Topics Concern   Not on file  Social History Narrative   Lives in the home with his wife   Sees [place] every 6 month for meds.   Denies asbestos exposure   Social Determinants of Corporate investment banker Strain: Not on file  Food Insecurity: No Food Insecurity   Worried About Programme researcher, broadcasting/film/video in the Last Year: Never true   Ran Out of Food in the Last Year: Never true  Transportation Needs: No Transportation Needs   Lack of Transportation (Medical): No   Lack of Transportation (Non-Medical): No  Physical Activity: Not on file  Stress: Not on file  Social Connections: Not on file  Intimate Partner Violence: Not on file    Family History  Problem Relation Age of Onset   Other Mother        gun shot    ROS: no fevers or chills, productive cough, hemoptysis, dysphasia, odynophagia, melena, hematochezia, dysuria, hematuria, rash, seizure activity, orthopnea, PND, pedal edema, claudication. Remaining systems are negative.  Physical Exam: Well-developed well-nourished in no acute distress.  Skin is warm and dry.  HEENT is normal.  Neck is supple.   Chest with diminished breath sounds throughout Cardiovascular exam is regular rate and rhythm.  Abdominal exam nontender or distended. No masses palpated. Extremities show no edema. neuro grossly intact   A/P  1 coronary artery disease-patient doing well with no chest pain.  Continue statin.  He is not on aspirin given history of ITP, thrombocytopenia and  recurrent hemoptysis.  2 paroxysmal atrial fibrillation-he is in sinus rhythm on examination today.  Apixaban has been discontinued due to history of ITP, thrombocytopenia and hemoptysis.  3 hypertension-blood pressure controlled.  Continue present medical regimen.  4 hyperlipidemia-continue statin.  5 history of cardiomyopathy-LV function has improved on most recent echocardiogram.  6 chronic combined systolic/diastolic congestive heart failure-patient is euvolemic on examination today.  Continue diuretic as needed.  7 thoracic/abdominal/iliac aneurysms-patient is followed by vascular surgery.  8 severe COPD-managed by pulmonary.  9 ITP-Per hematology.  Kirk Ruths, MD

## 2021-09-03 ENCOUNTER — Telehealth: Payer: Self-pay | Admitting: Cardiology

## 2021-09-03 NOTE — Telephone Encounter (Signed)
° °  Pt said he have the emergency device necklace, its something he press if in case he falls or an emergency. He wanted to know if its compatible or its ok to wear it near his pacemaker

## 2021-09-03 NOTE — Telephone Encounter (Signed)
Per [employer] EMI document, there is no interaction with medical alert necklace and medtronic device.

## 2021-09-05 NOTE — Telephone Encounter (Signed)
Noted  

## 2021-09-05 NOTE — Telephone Encounter (Signed)
Karl from [employer] is requesting Office Notes from 03/08/21 and Also from the visit this coming Monday 09/09/21  Fax number Attm [place]: [phone]

## 2021-09-09 ENCOUNTER — Ambulatory Visit: Payer: Medicare PPO | Admitting: Internal Medicine

## 2021-09-09 ENCOUNTER — Other Ambulatory Visit: Payer: Self-pay

## 2021-09-09 ENCOUNTER — Encounter: Payer: Self-pay | Admitting: Internal Medicine

## 2021-09-09 DIAGNOSIS — E118 Type 2 diabetes mellitus with unspecified complications: Secondary | ICD-10-CM

## 2021-09-09 DIAGNOSIS — Z794 Long term (current) use of insulin: Secondary | ICD-10-CM | POA: Diagnosis not present

## 2021-09-09 NOTE — Patient Instructions (Signed)
We will make sure the sugar meter gets approved.

## 2021-09-11 ENCOUNTER — Other Ambulatory Visit: Payer: Self-pay

## 2021-09-11 ENCOUNTER — Encounter: Payer: Self-pay | Admitting: Cardiology

## 2021-09-11 ENCOUNTER — Ambulatory Visit: Payer: Medicare PPO | Admitting: Cardiology

## 2021-09-11 VITALS — BP 136/60 | HR 51 | Ht 72.0 in | Wt 208.0 lb

## 2021-09-11 DIAGNOSIS — I255 Ischemic cardiomyopathy: Secondary | ICD-10-CM

## 2021-09-11 DIAGNOSIS — I480 Paroxysmal atrial fibrillation: Secondary | ICD-10-CM

## 2021-09-11 DIAGNOSIS — E7800 Pure hypercholesterolemia, unspecified: Secondary | ICD-10-CM | POA: Diagnosis not present

## 2021-09-11 DIAGNOSIS — I712 Thoracic aortic aneurysm, without rupture, unspecified: Secondary | ICD-10-CM | POA: Diagnosis not present

## 2021-09-11 NOTE — Patient Instructions (Signed)
°  Follow-Up: At [facility], you and your health needs are our priority.  As part of our continuing mission to provide you with exceptional heart care, we have created designated Provider Care Teams.  These Care Teams include your primary Cardiologist (physician) and Advanced Practice Providers (APPs -  Physician Assistants and Nurse Practitioners) who all work together to provide you with the care you need, when you need it.  We recommend signing up for the patient portal called "MyChart".  Sign up information is provided on this After Visit Summary.  MyChart is used to connect with patients for Virtual Visits (Telemedicine).  Patients are able to view lab/test results, encounter notes, upcoming appointments, etc.  Non-urgent messages can be sent to your provider as well.   To learn more about what you can do with MyChart, go to [email or web address].    Your next appointment:   6 month(s)  The format for your next appointment:   In Person  Provider:   Olga Millers MD1}

## 2021-09-13 NOTE — Progress Notes (Signed)
° °  Subjective:   Patient ID: [name], male    DOB: 14-Jan-1941, 81 y.o.   MRN: [id number]  HPI The patient is an 81 YO man coming in for follow up. Still using manual sugar checks and wants to do continuous glucose monitoring frustrated by lack of progress. HgA1c checked at the [place] in the last few weeks which was 10.   Review of Systems  Constitutional: Negative.   HENT: Negative.    Eyes: Negative.   Respiratory:  Negative for cough, chest tightness and shortness of breath.   Cardiovascular:  Negative for chest pain, palpitations and leg swelling.  Gastrointestinal:  Negative for abdominal distention, abdominal pain, constipation, diarrhea, nausea and vomiting.  Musculoskeletal:  Positive for arthralgias.  Skin: Negative.   Neurological: Negative.   Psychiatric/Behavioral: Negative.     Objective:  Physical Exam Constitutional:      Appearance: He is well-developed.  HENT:     Head: Normocephalic and atraumatic.  Cardiovascular:     Rate and Rhythm: Normal rate and regular rhythm.  Pulmonary:     Effort: Pulmonary effort is normal. No respiratory distress.     Breath sounds: Normal breath sounds. No wheezing or rales.     Comments: Oxygen in place Abdominal:     General: Bowel sounds are normal. There is no distension.     Palpations: Abdomen is soft.     Tenderness: There is no abdominal tenderness. There is no rebound.  Musculoskeletal:        General: Tenderness present.     Cervical back: Normal range of motion.  Skin:    General: Skin is warm and dry.  Neurological:     Mental Status: He is alert and oriented to person, place, and time.     Coordination: Coordination normal.    Vitals:   09/09/21 0959  BP: 132/70  Pulse: 76  Resp: 18  SpO2: 93%  Weight: 212 lb 3.2 oz (96.3 kg)  Height: 6' (1.829 m)   This visit occurred during the SARS-CoV-2 public health emergency.  Safety protocols were in place, including screening questions prior to the visit,  additional usage of staff PPE, and extensive cleaning of exam room while observing appropriate contact time as indicated for disinfecting solutions.   Assessment & Plan:

## 2021-09-13 NOTE — Telephone Encounter (Signed)
Paperwork has been faxed to [place]. Confirmation fax has been received.

## 2021-09-13 NOTE — Assessment & Plan Note (Signed)
Would significantly benefit from cgm. We have tried to order this for him and he is struggling with fulfillment of this rx. Visit note will be faxed to company trying to fulfill this. Recent HGA1c with [facility] 10 which is above goal and he is working on adjusting insulin and needs cgm to help assist this goal.

## 2021-09-17 ENCOUNTER — Ambulatory Visit (INDEPENDENT_AMBULATORY_CARE_PROVIDER_SITE_OTHER): Payer: Medicare PPO | Admitting: *Deleted

## 2021-09-17 DIAGNOSIS — E118 Type 2 diabetes mellitus with unspecified complications: Secondary | ICD-10-CM | POA: Diagnosis not present

## 2021-09-17 DIAGNOSIS — J449 Chronic obstructive pulmonary disease, unspecified: Secondary | ICD-10-CM

## 2021-09-17 NOTE — Chronic Care Management (AMB) (Cosign Needed)
Chronic Care Management   CCM RN Visit Note  09/17/2021 Name: [name] MRN: [id number] DOB: 1940-08-24  Subjective: [name] is a 81 y.o. year old male who is a primary care patient of Myrlene Broker, MD. The care management team was consulted for assistance with disease management and care coordination needs.    Engaged with patient by telephone for follow up visit in response to provider referral for case management and/or care coordination services.   Consent to Services:  The patient was given information about Chronic Care Management services, agreed to services, and gave verbal consent prior to initiation of services.  Please see initial visit note for detailed documentation.  Patient agreed to services and verbal consent obtained.   Assessment: Review of patient past medical history, allergies, medications, health status, including review of consultants reports, laboratory and other test data, was performed as part of comprehensive evaluation and provision of chronic care management services.   SDOH (Social Determinants of Health) assessments and interventions performed:  SDOH Interventions    Flowsheet Row Most Recent Value  SDOH Interventions   Food Insecurity Interventions Intervention Not Indicated  [Continues to deny food insecurity]  Housing Interventions Intervention Not Indicated  [Contiues living alone,  daughter checks in with patient frequently]  Transportation Interventions Intervention Not Indicated  [Reports he continues to drive self,  daughter able to assist as/ if indicated]     CCM Care Plan  No Known Allergies  Outpatient Encounter Medications as of 09/17/2021  Medication Sig Note   albuterol (PROVENTIL) (2.5 MG/3ML) 0.083% nebulizer solution Take 3 mLs (2.5 mg total) by nebulization every 6 (six) hours as needed for wheezing or shortness of breath. 04/24/2021: 04/24/21-- reports has not needed in several years   albuterol (VENTOLIN  HFA) 108 (90 Base) MCG/ACT inhaler Inhale 2 puffs into the lungs every 6 (six) hours as needed for wheezing or shortness of breath. 04/24/2021: 04/24/21- reports uses only occasionally   atorvastatin (LIPITOR) 40 MG tablet Take 1 tablet (40 mg total) by mouth daily.    Carboxymethylcellul-Glycerin (LUBRICATING EYE DROPS OP) Place 1 drop into both eyes 4 (four) times daily as needed (dry eyes).     Cholecalciferol (VITAMIN D3) 2000 units capsule Take 2,000 Units by mouth daily.     Continuous Blood Gluc Receiver (FREESTYLE LIBRE 14 DAY READER) DEVI 1 Stick by Does not apply route daily.    empagliflozin (JARDIANCE) 10 MG TABS tablet Take 5 mg by mouth daily. Filled at [place].    fluticasone (FLONASE) 50 MCG/ACT nasal spray Place 1 spray into both nostrils daily.    furosemide (LASIX) 40 MG tablet Take 1 tablet (40 mg total) by mouth daily. (Patient taking differently: Take 40 mg by mouth daily as needed (fluid retention.).)    gabapentin (NEURONTIN) 100 MG capsule Take 1 capsule (100 mg total) by mouth 2 (two) times daily.    insulin glargine (LANTUS) 100 UNIT/ML injection Inject 45 Units into the skin at bedtime.    levothyroxine (SYNTHROID) 175 MCG tablet Take 175 mcg by mouth daily before breakfast.    losartan (COZAAR) 50 MG tablet Take 1 tablet (50 mg total) by mouth daily.    Menthol, Topical Analgesic, (BIOFREEZE EX) Apply 1 application topically 3 (three) times daily as needed (pain. (calf muscle)).    metFORMIN (GLUCOPHAGE) 500 MG tablet Take 500-1,000 mg by mouth 2 (two) times daily with a meal. Take 1 tablet (500 mg) by mouth in the morning & take 2  tablets (1000 mg) by mouth in the evening.    Multiple Vitamins-Minerals (MEGA MULTIVITAMIN FOR MEN PO) Take 1 tablet by mouth daily.    OXYGEN Inhale 2 L into the lungs continuous.    predniSONE (DELTASONE) 5 MG tablet Take 1 tablet (5 mg total) by mouth daily with breakfast.    Semaglutide (OZEMPIC, 1 MG/DOSE, [place]) Inject 1 mg into the skin every  Friday.    tamsulosin (FLOMAX) 0.4 MG CAPS capsule Take 1 capsule (0.4 mg total) by mouth at bedtime.    Tiotropium Bromide-Olodaterol (STIOLTO RESPIMAT) 2.5-2.5 MCG/ACT AERS Inhale 2 puffs into the lungs daily.    No facility-administered encounter medications on file as of 09/17/2021.   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   Claudication (HCC) 07/03/2020   Physical deconditioning 06/08/2020   Hemoptysis 02/16/2020   Syncope 10/04/2019   LBBB (left bundle branch block) 10/04/2019   Pacemaker 10/04/2019   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   Lung nodules 03/25/2019   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   Symptomatic anemia 01/20/2018   BENIGN PROSTATIC HYPERTROPHY 12/2017   Chronic respiratory failure with hypoxia (HCC) 12/01/2017   PAF (paroxysmal atrial fibrillation) (HCC)    Ischemic cardiomyopathy 09/02/2017   Chronic systolic CHF (congestive heart failure) (HCC) 09/02/2017   Aortic aneurysm (HCC) 09/02/2017   Diabetes mellitus with complication, with long-term current use of insulin (HCC) 09/01/2017   Anxiety and depression 09/01/2017   COPD (chronic obstructive pulmonary disease) (HCC) 09/01/2017   CORONARY ARTERY DISEASE 08/28/2016   Myogenic ptosis of right eyelid 07/11/2016   Open angle with borderline findings, low risk, bilateral 07/11/2016   Bilateral dry eyes 04/14/2014   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/14/2014   Hyperlipidemia associated with type 2 diabetes mellitus (HCC) 01/05/2014   High risk medication use  10/07/2013   Pseudophakia of both eyes 10/07/2013   [place] of left macula 12/01/2012   Scleritis of both eyes 12/01/2012   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   PTSD (post-traumatic stress disorder) 03/10/2012   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   Rheumatoid arthritis (HCC) 09/09/2011   Scleromalacia 09/09/2011   COPD with acute exacerbation (HCC) 08/28/2011   OSA on CPAP 06/10/2011   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   GAIT DISTURBANCE 12/10/2009   Coronary atherosclerosis 11/23/2009   Hypothyroidism 07/30/2009   Essential hypertension 07/30/2009   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 addressed/monitored:  COPD and DMII  Care Plan : RN Care Manager Plan of Care  Updates made by Michaela Corner, RN since 09/17/2021 12:00 AM     Problem: Chronic Disease Management Needs   Priority: Medium     Long-Range Goal: Ongoing adherence to established plan of care for long term chronic disease management   Start Date: 04/24/2021  Expected End Date: 04/24/2022  Priority: Medium  Note:   Current Barriers:  Chronic Disease Management support and education needs related to COPD and DMII Fragile state of health, multiple progressing chronic health conditions- on continuous home O2 at 2-3 L/min  RNCM Clinical Goal(s):  Patient will demonstrate ongoing health management independence with COPD, DMII, multiple  chronic health issues  through collaboration with RN Care manager, provider, and care team.   Interventions: 1:1 collaboration with primary care provider regarding development and update of comprehensive plan of care as evidenced by provider attestation and co-signature Inter-disciplinary care team collaboration (see longitudinal plan of care) Evaluation of current treatment plan related to  self management and patient's adherence to plan as established by provider 09/17/21: SDOH updated: no new/ unmet concerns identified Medication discussed: reports no recent changes/ concerns with medications:  continues self-managing and verbalizes good general understanding of purpose/ dosing/ scheduling of medications Confirmed patient heard just this morning from insurance company re: obtaining CGM-- reports this will be shipped to him today Falls assessment updated: confirmed no new/ recent falls since "last fall;" continues using cane as/ if indicated- reports mainly uses when he "leaves house;" he also tells me that he has recently obtained [employer] system/ is using at home regularly; positive reinforcement provided with encouragement to continue efforts to prevent falls Pain assessment updated: denies acute/ chronic pain; reports "usual" aches and pain that does not require intervention; denies unmanageable pain/ discomfort Reviewed upcoming provider appointments: 09/24/21- vascular provider; 10/07/21- CT scan chest/ abdomen; 11/25/21- pulmonary provider  COPD: (Status: 09/17/21: Goal on Track (progressing): YES.) Long term Goal Advised patient to track and manage COPD triggers Provided instruction about proper use of medications used for management of COPD including inhalers Advised patient to self assesses COPD action plan zone and make appointment with provider if in the yellow zone for 48 hours without improvement Advised patient to engage in light exercise as tolerated 3-5 days a week to aid in the the management of COPD Discussed the importance of adequate rest and management of fatigue with COPD Discussed current clinical condition: reports at baseline with breathing, "no concerns or problems;" continues using maintenance inhaler as prescribed; rarely uses rescue inhaler, continues using home O2 and CPAP as prescribed; confirmed he has not needed to use diuretic-- states he applies compression hose if he experiences swelling, which "works well to bring swelling down" Reinforced previously provided education action plan for COPD flares: patient has good baseline understanding of same; managing COPD  well at home on day-to-day basis; will benefit from ongoing education, support, reinforcement Confirmed patient remains independent, active and regularly exercises- positive reinforcement provided; benefits of maintaining activity and exercise routine over time discussed, encouraged patient to pace self and not over-do activity Confirmed patient follows low salt diet- positive reinforcement provided  Diabetes:  (Status: 09/17/21: Goal on Track (progressing): YES.) Long Term Goal Lab Results  Component Value Date   HGBA1C 10.3 (H) 08/21/2020  Reviewed prescribed diet with patient low sugar/ carbohydrate, low salt, heart healthy; Counseled on importance of regular laboratory monitoring as prescribed;        Discussed plans with patient for ongoing care management follow up and provided patient with direct contact information for care management team;      Review of patient status, including review of consultants reports, relevant laboratory and other test results, and medications completed;       As above, confirmed patient CGM has been shipped as of today; discussed use of CGM with patient; he reports that his daughter will assist in setting monitor up; encouraged him to contact CGM company for set-up/ use help and assistance if needed; also made him aware that referral to Diabetes education center can be requested IF he has difficulty learning how to use his CGM once he receives it; discussed benefit of having GCM  for reviewing trends, correlating blood sugars at home to averages to stay on top of A1-C results: he is very excited about receiving this monitor Confirmed has not been regularly monitoring blood sugars with glucometer-- reports his fingers are "just too sore" Confirmed no changes to medications for DMII: confirms he continues taking Lantus insulin 45 U qHS; metformin BID as prescribed; jardiance QD; Ozempic weekly Discussed/ provided verbal education around benefits of exercise in setting  of DMII/ COPD- positive reinforcement provided for patient staying regularly active/ following exercise routine at home, once he begins to feel better after having current URI symptoms  Patient Goals/Self-Care Activities: As evidenced by review of EHR, collaboration with care team, and patient reporting during CCM RN CM outreach,  Patient [name] will: Take medications as prescribed   Attend all scheduled provider appointments  Call pharmacy for medication refills  Call provider office for new concerns or questions  Continue to follow heart healthy, low salt, low carbohydrate, low sugar diet Continue to monitor blood sugars at home- twice daily is best: first thing in the morning before eating; and then again 2 hours after a regular meal I am so glad to hear that you will be obtaining continuous glucose monitor (CGM) soon Remain active and continue his established exercise program at home, once your current symptoms of respiratory infection have resolved/ improved Keep up the great work preventing falls at home: continue using your cane as needed    Plan: Telephone follow up appointment with care management team member scheduled for:  Wednesday, October 16, 2021 at 9:45 am The patient has been provided with contact information for the care management team and has been advised to call with any health related questions or concerns  Caryl PinaLaine Mckinney Gasper Hopes, RN, BSN, CCRN Alumnus [facility] RN Care Coordination- [facility] [place] [phone]: direct office

## 2021-09-17 NOTE — Patient Instructions (Signed)
Visit Information  [name], thank you for taking time to talk with me today. Please don't hesitate to contact me if I can be of assistance to you before our next scheduled telephone appointment.  Below are the goals we discussed today:  Patient Self-Care Activities: Patient [name] will: Take medications as prescribed   Attend all scheduled provider appointments  Call pharmacy for medication refills  Call provider office for new concerns or questions  Continue to follow heart healthy, low salt, low carbohydrate, low sugar diet Continue to monitor blood sugars at home- twice daily is best: first thing in the morning before eating; and then again 2 hours after a regular meal I am so glad to hear that you will be obtaining continuous glucose monitor (CGM) soon Remain active and continue his established exercise program at home, once your current symptoms of respiratory infection have resolved/ improved Keep up the great work preventing falls at home: continue using your cane as needed  Our next scheduled telephone follow up visit/ appointment with care management team member is scheduled on:   Wednesday, October 16, 2021 at 9:45 am- This is a PHONE CALL appointment  If you need to cancel or re-schedule our visit, please call [phone] and our care guide team will be happy to assist you.   I look forward to hearing about your progress.   [name], RN, BSN, CCRN Alumnus [facility] RN Care Coordination- [facility] [phone]: direct office  If you are experiencing a Mental Health or Behavioral Health Crisis or need someone to talk to, please  call the Suicide and Crisis Lifeline: 988 call the [place] National Suicide Prevention Lifeline: (352) 493-3559 or TTY: 605-656-2809 TTY (574)144-0324) to talk to a trained counselor call 1-800-273-TALK (toll free, 24 hour hotline) go to [facility] [address] [place] [phone]) call  911   Patient verbalizes understanding of instructions and care plan provided today and agrees to view in MyChart. Active MyChart status confirmed with patient  Continuous Glucose Monitoring, Adult Continuous glucose monitoring (CGM) is a way to check your blood sugar (or blood glucose) level at any time. A CGM system includes a sensor that attaches to the skin of your belly or arm. The sensor reads the amount of glucose in the fluid between the cells under your skin. Every few minutes throughout the day and night, the sensor wirelessly sends signals to a glucose monitor that records and saves the readings. With most CGM systems, you also need to do a finger stick twice each day to make sure your finger stick reading matches the reading from your CGM device. CGM reduces the number of times you need to do a finger stick throughout the day. CGM lets you to see your glucose levels in real time. This information can help you make decisions about your diet, physical activity, and diabetes medicine. With most monitors, you also need to do a finger stick and check your blood sugar level using a regular glucose monitor before making any medicine decisions. Your health care provider may prescribe a CGM system if: You have type 1 diabetes. You have type 2 diabetes and use insulin. Your diabetes needs to be under tight control. You do not always have awareness of the warning symptoms of low blood sugar. You often have episodes of high blood sugar (hyperglycemia) or low blood sugar (hypoglycemia). Options for CGM systems There are several options for CGM systems. Some include monitors that: You carry or  wear. Attach to and control an insulin pump. Send readings to your smartphone, tablet, or health care provider. Set off an alarm if you have low blood sugar or high blood sugar. Work with your health care provider and diabetes educator to find the best option for you. Before you start using a CGM system at  home, you will be trained in how to use it. Using a CGM can help you improve the results of your A1C test, which shows your average blood sugar levels for the past 3 months. Using a CGM also can help you avoid hyperglycemia or hypoglycemia. Let your health care provider know if you do not understand how to use your CGM system or have questions. What are the risks? Generally, these devices are safe to use. However, it is possible that the insertion site may become irritated or infected. Tips for using a CGM system: Follow the instructions for your device carefully. Instructions are different for different systems. Make sure you understand how to place the CGM sensors and set up the monitor before you start using it at home. Most devices require that you replace the CGM sensor every 3 to 7 days, depending on the model. Work closely with your health care provider and diabetes educator to learn about your CGM and make sure you are using your CGM system safely and effectively. Do not take baths, swim, or use a hot tub unless your health care provider approves. CGM sensors are usually waterproof and can be left on in the bath or shower. CGM monitors are usually not waterproof. These should be kept dry. Follow these instructions at home: Follow your diabetes treatment plan to keep your blood sugar within your target range. Take action when the CGM alarm alerts you of high or low blood sugar levels. Check your CGM system insertion site every day for signs of infection. Check for: Redness, swelling, or pain. Fluid or blood. Warmth. Pus or a bad smell. Keep all follow-up visits as told by your health care provider. This is important. Where to find more information [employer], Continuous Glucose Monitoring: [email or web address] Contact a health care provider if: You are not sure how to use your CGM system or how to interpret the readings. You have finger sticks that do not match your monitor  readings. Your CGM often alerts you of hyperglycemia or hypoglycemia. You have a fever. Get help right away if: You have signs of infection at your insertion site. You have symptoms of hyperglycemia or hypoglycemia that do not get better with treatment. Summary Continuous glucose monitoring (CGM) is a way to check your blood sugar (or bloodglucose) level at any time. A CGM system includes a sensor that attaches to the skin of your belly or arm and a monitor to view the readings. CGM lets you to see your blood sugar levels in real time. This information can help you make decisions about your diet, physical activity, and diabetes medicine. Let your health care provider or diabetes educator know if you do not understand how to use your CGM system or have questions. This information is not intended to replace advice given to you by your health care provider. Make sure you discuss any questions you have with your health care provider. Document Revised: 08/12/2019 Document Reviewed: 08/12/2019 Elsevier Patient Education  2022 [employer].

## 2021-09-24 ENCOUNTER — Ambulatory Visit (INDEPENDENT_AMBULATORY_CARE_PROVIDER_SITE_OTHER)
Admission: RE | Admit: 2021-09-24 | Discharge: 2021-09-24 | Disposition: A | Discharge status: Home / Self Care | Payer: Medicare PPO | Source: Ambulatory Visit | Attending: Vascular Surgery | Admitting: Vascular Surgery

## 2021-09-24 ENCOUNTER — Ambulatory Visit (HOSPITAL_COMMUNITY)
Admission: RE | Admit: 2021-09-24 | Discharge: 2021-09-24 | Disposition: A | Discharge status: Home / Self Care | Payer: Medicare PPO | Source: Ambulatory Visit | Attending: Vascular Surgery | Admitting: Vascular Surgery

## 2021-09-24 ENCOUNTER — Encounter: Payer: Self-pay | Admitting: Vascular Surgery

## 2021-09-24 ENCOUNTER — Other Ambulatory Visit: Payer: Self-pay

## 2021-09-24 ENCOUNTER — Ambulatory Visit: Payer: Medicare PPO | Admitting: Vascular Surgery

## 2021-09-24 VITALS — BP 154/68 | HR 59 | Temp 98.0°F | Resp 18 | Ht 74.0 in | Wt 205.0 lb

## 2021-09-24 DIAGNOSIS — E118 Type 2 diabetes mellitus with unspecified complications: Secondary | ICD-10-CM | POA: Diagnosis not present

## 2021-09-24 DIAGNOSIS — J449 Chronic obstructive pulmonary disease, unspecified: Secondary | ICD-10-CM | POA: Diagnosis not present

## 2021-09-24 DIAGNOSIS — I724 Aneurysm of artery of lower extremity: Secondary | ICD-10-CM | POA: Insufficient documentation

## 2021-09-24 DIAGNOSIS — Z794 Long term (current) use of insulin: Secondary | ICD-10-CM | POA: Diagnosis not present

## 2021-09-24 NOTE — Progress Notes (Signed)
Patient name: [name] MRN: [id number] DOB: [birth year] Sex: male  REASON FOR VISIT:.  6 month follow-up for surveillance of bilateral popliteal aneurysm disease  HPI: [name] is a 81 y.o. male with multiple medical comorbidities including COPD (on home oxygen) and CHF who presents for 6 month follow-up of his popliteal aneurysm disease.  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  Of note [name] does have a history of an aortobiiliac bypass for AAA in 2004.  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  On follow-up today states [name] is overall doing well.  [name] is not having any significant pain in his legs.  No pain in the feet.  Still remains on oxygen.  Feels that his left leg bypass took almost a year to recover and [name] still recovering.    Past Medical History:  Diagnosis Date   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   ablated   Anxiety    Aortic aneurysm ([place]) 09/02/2017   BENIGN PROSTATIC HYPERTROPHY 11/23/2009   Cardiomyopathy ([place]) [id number]   Chronic systolic CHF (congestive heart failure) ([place]) 09/02/2017   COPD (chronic obstructive pulmonary disease) ([place])    CORONARY ARTERY DISEASE 11/23/2009   DECREASED HEARING, 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   DEGENERATIVE JOINT DISEASE 11/23/2009   DEPRESSION 11/23/2009   FATIGUE 11/23/2009   GAIT DISTURBANCE 12/10/2009   HEMOPTYSIS UNSPECIFIED 05/07/2010   High cholesterol    HYPERTENSION 07/30/2009   HYPOTHYROIDISM 07/30/2009   Ischemic cardiomyopathy 09/02/2017   LUMBAR RADICULOPATHY, 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   On home oxygen therapy    "2-3L; 24/7" (09/10/2016)   OSA on CPAP    Pneumonia    PTSD (post-traumatic stress disorder) 03/10/2012   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   RA (rheumatoid arthritis) ([place]) 06/11/2011   "qwhere"  (09/10/2016)   RESPIRATORY FAILURE, CHRONIC 07/31/2009   Scleritis of both eyes 03/17/2014   Thrombocytopenia ([place])    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   Type II diabetes mellitus ([place])     Past Surgical History:  Procedure Laterality Date   ABDOMINAL AORTIC ANEURYSM REPAIR  07/2002   Archie Endo 12/10/2010   ABDOMINAL AORTOGRAM W/LOWER EXTREMITY N/A 08/16/2020   Procedure: ABDOMINAL AORTOGRAM W/LOWER EXTREMITY;  Surgeon: [name], MD;  Location: [place] [facility];  Service: Cardiovascular;  Laterality: N/A;   ABDOMINAL EXPLORATION SURGERY  02/2004   w/LOA/notes 12/10/2010; small bowel obstruction repair with adhesiolysis    BACK SURGERY     CARDIAC CATHETERIZATION     2 heart caths in the past.  One in 2000s showed one ulcerated plaque  Rx medically; Second at [facility] /notes 09/05/2016   CATARACT EXTRACTION W/ INTRAOCULAR LENS  IMPLANT, BILATERAL Bilateral 2000s   COLECTOMY     hx of remote ileum resection due to bleeding   COLONOSCOPY WITH PROPOFOL N/A 01/22/2018   Procedure: COLONOSCOPY WITH PROPOFOL;  Surgeon: Gatha Mayer, MD;  Location: [facility];  Service: Endoscopy;  Laterality: N/A;   CORONARY ANGIOPLASTY WITH STENT PLACEMENT  09/10/2016   CORONARY STENT INTERVENTION N/A 09/10/2016   Procedure: Coronary Stent Intervention;  Surgeon: Burnell Blanks, MD;  Location: [place] [facility];  Service: Cardiovascular;  Laterality: N/A;  Distal RCA 4.0x16 Synergy   ESOPHAGOGASTRODUODENOSCOPY (EGD) WITH PROPOFOL N/A 01/22/2018   Procedure: ESOPHAGOGASTRODUODENOSCOPY (EGD) WITH PROPOFOL;  Surgeon:  Gatha Mayer, MD;  Location: [facility];  Service: Endoscopy;  Laterality: N/A;   EYE SURGERY     FALSE ANEURYSM REPAIR Left 08/22/2020   Procedure: EXCLUSION OF LEFT POPLITEAL ARTERY ANEURYSM;  Surgeon: [name], MD;  Location: [place];  Service: Vascular;  Laterality: Left;   FEMORAL EMBOLOECTOMY Left 07/2000   with left leg ischemia; Dr. Kellie Simmering, vascular   FEMORAL-POPLITEAL BYPASS GRAFT  Bilateral 08/22/2020   Procedure: LEFT FEMORAL-POSTERIOR TIBIAL ARTERY BYPASS GRAFT USING RIGHT GREATER NONREVERSED SAPHENOUS VEIN GRAFT;  Surgeon: [name], MD;  Location: [place];  Service: Vascular;  Laterality: Bilateral;   GANGLION CYST EXCISION Right    "wrist"; Dr. Daylene Katayama   HOT HEMOSTASIS N/A 01/22/2018   Procedure: HOT HEMOSTASIS (ARGON PLASMA COAGULATION/BICAP);  Surgeon: Gatha Mayer, MD;  Location: [facility];  Service: Endoscopy;  Laterality: N/A;   LOOP RECORDER INSERTION N/A 04/25/2019   Procedure: LOOP RECORDER INSERTION;  Surgeon: Deboraha Sprang, MD;  Location: [place] [facility];  Service: Cardiovascular;  Laterality: N/A;   LOOP RECORDER REMOVAL N/A 07/01/2019   Procedure: LOOP RECORDER REMOVAL;  Surgeon: Deboraha Sprang, MD;  Location: [place] [facility];  Service: Cardiovascular;  Laterality: N/A;   LUMBAR LAMINECTOMY  1972   Dr. Collie Siad   PACEMAKER IMPLANT N/A 07/01/2019   Procedure: PACEMAKER IMPLANT;  Surgeon: Deboraha Sprang, MD;  Location: [place] [facility];  Service: Cardiovascular;  Laterality: N/A;   RIGHT/LEFT HEART CATH AND CORONARY ANGIOGRAPHY N/A 09/10/2016   Procedure: Right/Left Heart Cath and Coronary Angiography;  Surgeon: Burnell Blanks, MD;  Location: [place] [facility];  Service: Cardiovascular;  Laterality: N/A;   TONSILLECTOMY      Family History  Problem Relation Age of Onset   Other Mother        gun shot    SOCIAL HISTORY: Social History   Tobacco Use   Smoking status: Former    Packs/day: 2.50    Years: 40.00    Pack years: 100.00    Types: Cigarettes, Pipe, Cigars    Quit date: 07/28/1998    Years since quitting: 23.1   Smokeless tobacco: Never  Substance Use Topics   Alcohol use: No    Alcohol/week: 0.0 standard drinks    No Known Allergies  Current Outpatient Medications  Medication Sig Dispense Refill   albuterol (PROVENTIL) (2.5 MG/3ML) 0.083% nebulizer solution Take 3 mLs (2.5 mg total) by nebulization every  6 (six) hours as needed for wheezing or shortness of breath. 120 mL 12   albuterol (VENTOLIN HFA) 108 (90 Base) MCG/ACT inhaler Inhale 2 puffs into the lungs every 6 (six) hours as needed for wheezing or shortness of breath. 8 g 6   atorvastatin (LIPITOR) 40 MG tablet Take 1 tablet (40 mg total) by mouth daily. 90 tablet 3   Carboxymethylcellul-Glycerin (LUBRICATING EYE DROPS OP) Place 1 drop into both eyes 4 (four) times daily as needed (dry eyes).      Cholecalciferol (VITAMIN D3) 2000 units capsule Take 2,000 Units by mouth daily.      Continuous Blood Gluc Receiver (FREESTYLE LIBRE 14 DAY READER) DEVI 1 Stick by Does not apply route daily. 1 each 5   empagliflozin (JARDIANCE) 10 MG TABS tablet Take 5 mg by mouth daily. Filled at [place].     fluticasone (FLONASE) 50 MCG/ACT nasal spray Place 1 spray into both nostrils daily. 18.2 mL 2   insulin glargine (LANTUS) 100 UNIT/ML injection Inject 45 Units into  the skin at bedtime.     levothyroxine (SYNTHROID) 175 MCG tablet Take 175 mcg by mouth daily before breakfast.     losartan (COZAAR) 50 MG tablet Take 1 tablet (50 mg total) by mouth daily. 90 tablet 3   Menthol, Topical Analgesic, (BIOFREEZE EX) Apply 1 application topically 3 (three) times daily as needed (pain. (calf muscle)).     metFORMIN (GLUCOPHAGE) 500 MG tablet Take 500-1,000 mg by mouth 2 (two) times daily with a meal. Take 1 tablet (500 mg) by mouth in the morning & take 2 tablets (1000 mg) by mouth in the evening.     Multiple Vitamins-Minerals (MEGA MULTIVITAMIN FOR MEN PO) Take 1 tablet by mouth daily.     OXYGEN Inhale 2 L into the lungs continuous.     predniSONE (DELTASONE) 5 MG tablet Take 1 tablet (5 mg total) by mouth daily with breakfast. 90 tablet 3   Semaglutide (OZEMPIC, 1 MG/DOSE, [place]) Inject 1 mg into the skin every Friday.     tamsulosin (FLOMAX) 0.4 MG CAPS capsule Take 1 capsule (0.4 mg total) by mouth at bedtime. 30 capsule 0   Tiotropium Bromide-Olodaterol (STIOLTO  RESPIMAT) 2.5-2.5 MCG/ACT AERS Inhale 2 puffs into the lungs daily. 4 g 0   furosemide (LASIX) 40 MG tablet Take 1 tablet (40 mg total) by mouth daily. (Patient taking differently: Take 40 mg by mouth daily as needed (fluid retention.).) 90 tablet 3   gabapentin (NEURONTIN) 100 MG capsule Take 1 capsule (100 mg total) by mouth 2 (two) times daily. 90 capsule 0   No current facility-administered medications for this visit.    REVIEW OF SYSTEMS:  [X]  denotes positive finding, [ ]  denotes negative finding Cardiac  Comments:  Chest pain or chest pressure:    Shortness of breath upon exertion:    Short of breath when lying flat:    Irregular heart rhythm:        Vascular    Pain in calf, thigh, or hip brought on by ambulation:    Pain in feet at night that wakes you up from your sleep:     Blood clot in your veins:    Leg swelling:         Pulmonary    Oxygen at home:    Productive cough:     Wheezing:         Neurologic    Sudden weakness in arms or legs:     Sudden numbness in arms or legs:     Sudden onset of difficulty speaking or slurred speech:    Temporary loss of vision in one eye:     Problems with dizziness:         Gastrointestinal    Blood in stool:     Vomited blood:         Genitourinary    Burning when urinating:     Blood in urine:        Psychiatric    Major depression:         Hematologic    Bleeding problems:    Problems with blood clotting too easily:        Skin    Rashes or ulcers:        Constitutional    Fever or chills:      PHYSICAL EXAM: Vitals:   09/24/21 1447  BP: (!) 154/68  Pulse: (!) 59  Resp: 18  Temp: 98 F (36.7 C)  TempSrc: Temporal  SpO2: 95%  Weight: 205 lb (93 kg)  Height: 6\' 2"  (1.88 m)    GENERAL: The patient is a well-nourished male, in no acute distress. The vital signs are documented above. CARDIAC: There is a regular rate and rhythm.  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  [name] really has no complaints today.  Unfortunately [name] has developed a stenosis in the outflow of his left leg bypass (for treatment of a thrombosed popliteal aneurysm).  I have recommended aortogram with lower extremity arteriogram with possible intervention to maintain long-term patency of the bypass.  This may be from a brachial approach given previous aortobiiliac bypass.  His right popliteal aneurysm has increased in size now measuring over 3 cm.  [name] wants to continue to defer treatment as I think [name] would require open surgery bypass and [name] does not have good anatomy for stenting.  [name] is now 81 years old and states it took him almost a year to recover from the left leg bypass and does not want to consider right leg surgery at this time.  We will get him scheduled in the Cath Lab for left leg intervention in the next 3 to 4 weeks.   [name], MD [facility] Office: [phone]

## 2021-09-25 ENCOUNTER — Other Ambulatory Visit: Payer: Self-pay

## 2021-09-27 ENCOUNTER — Ambulatory Visit (INDEPENDENT_AMBULATORY_CARE_PROVIDER_SITE_OTHER): Payer: Medicare PPO

## 2021-09-27 DIAGNOSIS — R55 Syncope and collapse: Secondary | ICD-10-CM | POA: Diagnosis not present

## 2021-09-27 LAB — CUP PACEART REMOTE DEVICE CHECK
Battery Remaining Longevity: 140 mo
Battery Voltage: 3.01 V
Brady Statistic AP VP Percent: 37.08 %
Brady Statistic AP VS Percent: 2.31 %
Brady Statistic AS VP Percent: 17.33 %
Brady Statistic AS VS Percent: 43.29 %
Brady Statistic RA Percent Paced: 39.92 %
Brady Statistic RV Percent Paced: 54.4 %
Date Time Interrogation Session: 20230302235555
Implantable Lead Implant Date: 20201204
Implantable Lead Implant Date: 20201204
Implantable Lead Location: 753859
Implantable Lead Location: 753860
Implantable Lead Model: 5076
Implantable Lead Model: 5076
Implantable Pulse Generator Implant Date: 20201204
Lead Channel Impedance Value: 342 Ohm
Lead Channel Impedance Value: 342 Ohm
Lead Channel Impedance Value: 399 Ohm
Lead Channel Impedance Value: 418 Ohm
Lead Channel Pacing Threshold Amplitude: 0.5 V
Lead Channel Pacing Threshold Amplitude: 0.5 V
Lead Channel Pacing Threshold Pulse Width: 0.4 ms
Lead Channel Pacing Threshold Pulse Width: 0.4 ms
Lead Channel Sensing Intrinsic Amplitude: 2 mV
Lead Channel Sensing Intrinsic Amplitude: 2 mV
Lead Channel Sensing Intrinsic Amplitude: 8 mV
Lead Channel Sensing Intrinsic Amplitude: 8 mV
Lead Channel Setting Pacing Amplitude: 1.5 V
Lead Channel Setting Pacing Amplitude: 2.5 V
Lead Channel Setting Pacing Pulse Width: 0.4 ms
Lead Channel Setting Sensing Sensitivity: 2 mV

## 2021-10-07 ENCOUNTER — Ambulatory Visit (HOSPITAL_BASED_OUTPATIENT_CLINIC_OR_DEPARTMENT_OTHER): Admission: RE | Admit: 2021-10-07 | Payer: Medicare PPO | Source: Ambulatory Visit

## 2021-10-07 NOTE — Progress Notes (Signed)
Remote pacemaker transmission.   

## 2021-10-09 ENCOUNTER — Telehealth: Payer: Self-pay | Admitting: Adult Health

## 2021-10-09 DIAGNOSIS — J449 Chronic obstructive pulmonary disease, unspecified: Secondary | ICD-10-CM

## 2021-10-09 NOTE — Telephone Encounter (Signed)
Called patient and he is very confused. He states that he went to get his CT of his lungs done today and was told that he could not get it done because he was supposed to be on a medication for atleast 30 days. He couldn't remember what medication and is very confused.  ?I told the patient I would get clarification from Dr Elsworth Soho on this and call him back with some answers.  ? ?Dr Elsworth Soho please advise, did you need him to be on a medication for this scan?? ?

## 2021-10-10 NOTE — Telephone Encounter (Signed)
Spoke to patient.  ?He stated that he was told by imaging center that he could not have CTA without taking a medication first. ?He is not sure which medication they are referring to. ? ?PCC's can you guys help with this? Thanks ?

## 2021-10-11 NOTE — Telephone Encounter (Signed)
Patient was scheduled to have CTA chest aorta W/CM &  or since this would have contrast he needed to have labwork done before he could do the CT it has nothing to do with medication No order placed for labwork ?

## 2021-10-11 NOTE — Telephone Encounter (Signed)
Called and spoke with patient again. He is aware that I will try to find out if it labwork or medication needed before his CT scan. He stated that he could not remember if it was medication or labwork. He is aware that I will call him back once I find out for sure.  ? ?I called and spoke with Janett Billow at [facility]. She stated that he will need a BMET done at least 24hrs before his CT scan. Since the ct scan has been cancelled, he will need to call [phone]. She stated that they do have a lab in the ED and at primary care on the 2nd floor if the patient does not want to come to our office.  ? ?Dr. Elsworth Soho, please advise if you are ok with [place] ordering the bmet for him. Thanks!  ?

## 2021-10-15 NOTE — Telephone Encounter (Signed)
Patient is aware of below message and voiced his understanding.  ?BMET ordered.  ?Provided patient with below number to reschedule CT. ?Nothing further needed.  ? ?

## 2021-10-16 ENCOUNTER — Other Ambulatory Visit: Payer: Self-pay | Admitting: Pulmonary Disease

## 2021-10-16 ENCOUNTER — Ambulatory Visit (INDEPENDENT_AMBULATORY_CARE_PROVIDER_SITE_OTHER): Payer: Medicare PPO | Admitting: *Deleted

## 2021-10-16 ENCOUNTER — Other Ambulatory Visit: Payer: Self-pay | Admitting: *Deleted

## 2021-10-16 DIAGNOSIS — E118 Type 2 diabetes mellitus with unspecified complications: Secondary | ICD-10-CM

## 2021-10-16 DIAGNOSIS — J449 Chronic obstructive pulmonary disease, unspecified: Secondary | ICD-10-CM

## 2021-10-16 NOTE — Patient Instructions (Signed)
Visit Information ? ?[name], thank you for taking time to talk with me today. Please don't hesitate to contact me if I can be of assistance to you before our next scheduled telephone appointment ? ?Below are the goals we discussed today:  ?Patient Self-Care Activities: ?Patient [name] will: ?Take medications as prescribed   ?Attend all scheduled provider appointments  ?Call pharmacy for medication refills  ?Call provider office for new concerns or questions  ?Continue to follow heart healthy, low salt, low carbohydrate, low sugar diet ?Continue to monitor blood sugars at home regularly using your continuous glucose monitor ?Today you reported an average blood sugar of "158" this is equivalent to an A1-C of approximately 7.2, which is in very good range from your last A1-C result ?Remain active and continue his established exercise program at home ?Keep up the great work preventing falls at home: continue using your cane as needed ? ?Our next scheduled telephone follow up visit/ appointment is scheduled on: Friday, November 08, 2021 at 9:45 am- This is a PHONE CALL appointment ? ?If you need to cancel or re-schedule our visit, please call [phone] and our care guide team will be happy to assist you. ?  ?I look forward to hearing about your progress. ?  ?Caryl PinaLaine Mckinney Kaylan Yates, RN, BSN, CCRN Alumnus ?[facility] RN Care Coordination- [facility] [place] [facility] ?[phone]) [phone]: direct office ? ?If you are experiencing a Mental Health or Behavioral Health Crisis or need someone to talk to, please  ?call the Suicide and Crisis Lifeline: 988 ?call the [place] National Suicide Prevention Lifeline: 30632768491-231 443 1318 or TTY: (212)204-85571-800-799-4 TTY 478-138-2599(1-(669) 516-7729) to talk to a trained counselor ?call 1-800-273-TALK (toll free, 24 hour hotline) ?go to [facility] [address], [place] [phone]) ?call 911  ? ?Patient verbalizes understanding of instructions and care plan provided today and agrees to  view in MyChart. Active MyChart status confirmed with patient ? ?Living With Diabetes ?Diabetes (type 1 diabetes mellitus or type 2 diabetes mellitus) is a condition in which the body does not have enough of a hormone called insulin, or the body does not respond properly to insulin. Normally, insulin allows sugars (glucose) to enter cells in the body. With diabetes, extra glucose builds up in the blood instead of going into cells. This results in high blood glucose (hyperglycemia). ?How to manage lifestyle changes ?Managing diabetes includes medical treatments as well as lifestyle changes. If diabetes is not managed well, serious physical and emotional complications can occur. Taking good care of yourself means that you are responsible for: ?Monitoring glucose regularly. ?Eating a healthy diet. ?Exercising regularly. ?Meeting with health care providers. ?Taking medicines as directed. ?Most people feel some stress about managing their diabetes. When this stress becomes too much, it is known as diabetes-related distress. This is very common. Living with diabetes can place you at risk for diabetes distress, depression, or anxiety. These disorders can make diabetes more difficult to manage. ?How to recognize stress ?You may have diabetes distress if you: ?Avoid or ignore your daily diabetes care. This includes glucose testing, following a meal plan, and taking medications. ?Feel overwhelmed by your daily diabetes care. ?Experience emotional reactions such as anger, sadness, or fear related to your daily diabetes care. ?Feel fear or shame about not doing everything perfectly that you have been told to do. ?Emotional distress ?Symptoms of diabetes distress include: ?Anger about having a diagnosis of diabetes. ?Fear or frustration about your diagnosis and the changes you need to make to manage  the condition. ?Being overly worried about the care that you need or the cost of the care that you need. ?Feeling like you caused  your condition by doing something wrong. ?Fear about unpredictable fluctuations in your blood glucose, like low or high blood glucose. ?Feeling judged by your health care providers. ?Feeling very alone with the disease. ?Depression ?Having diabetes means that you are at a higher risk for depression. Your health care provider may test (screen) you for symptoms of depression. It is important to recognize symptoms and to start treatment for depression soon after it is diagnosed. The following are some symptoms of depression: ?Loss of interest in things that you used to enjoy. ?Feeling depressed much or most of the time. ?A change in appetite. ?Trouble getting to sleep or staying asleep. ?Feeling tired most of the day. ?Feeling nervous and anxious. ?Feeling guilty and worrying that you are a burden to others. ?Having thoughts of hurting yourself or feeling that you want to die. ?If you have any of these symptoms, more days than not, for 2 weeks or longer, you may have depression. This would be a good time to contact your health care provider. ?Follow these instructions at home: ?Managing diabetes distress ?The following are some ways to manage emotional distress: ?Learn as much as you can about diabetes and its treatment. Take one step at a time to improve your management. ?Meet with a certified diabetes care and education specialist. Take a class to learn how to manage your condition. ?Consider working with a Veterinary surgeon or therapist. ?Keep a journal of your thoughts and concerns. ?Accept that some things are out of your control. ?Talk with other people who have diabetes. It can help to talk about the distress that you feel. ?Find ways to manage stress that work for you. These may include art or music therapy, exercise, meditation, and hobbies. ?Seek support from spiritual leaders, family, and friends. ? ?General instructions ?Do your best to follow your diabetes management plan. ?If you are struggling to follow your  plan, talk with a certified diabetes care and education specialist, or with someone else who has diabetes. They may have ideas that will help. ?Forgive yourself for not being perfect. Almost everyone struggles with the tasks of diabetes. ?Keep all follow-up visits. This is important. ?Where to find support ?Search for information and support from the American Diabetes Association: www.diabetes.org ?Find a certified diabetes education and care specialist. Make an appointment through the Association of Diabetes Care & Education Specialists: www.diabeteseducator.org ?Contact a health care provider if: ?You believe your diabetes is getting out of control. ?You are concerned you may be depressed. ?You think your medications are not helping control your diabetes. ?You are feeling overwhelmed with your diabetes. ?Get help right away if: ?You have thoughts about hurting yourself or others. ?If you ever feel like you may hurt yourself or others, or have thoughts about taking your own life, get help right away. You can go to your nearest emergency department or call: ?Your local emergency services (911 in the [place] ?A suicide crisis helpline, such as the National Suicide Prevention Lifeline at (818)554-8949 or 988 in the [place] This is open 24 hours a day. ?Summary ?Diabetes (type 1 diabetes mellitus or type 2 diabetes mellitus) is a condition in which the body does not have enough of a hormone called insulin, or the body does not respond properly to insulin. ?Living with diabetes puts you at risk for medical and emotional issues, such as diabetes distress, depression,  and anxiety. ?Recognizing the symptoms of diabetes distress and depression may help you avoid problems with your diabetes control. If you experience symptoms, it is important to discuss this with your health care provider, certified diabetes care and education specialist, or therapist. ?It is important to start treatment for diabetes distress and depression  soon after diagnosis. ?Ask your health care provider to recommend a therapist who understands both depression and diabetes. ?This information is not intended to replace advice given to you by your health care p

## 2021-10-16 NOTE — Chronic Care Management (AMB) (Signed)
?Chronic Care Management  ? ?CCM RN Visit Note ? ?10/16/2021 ?Name: [name] MRN: [id number] DOB: 1940/08/20 ? ?Subjective: ?[name] is a 81 y.o. year old male who is a primary care patient of Myrlene Broker, MD. The care management team was consulted for assistance with disease management and care coordination needs.   ? ?Engaged with patient by telephone for follow up visit in response to provider referral for case management and/or care coordination services.  ? ?Consent to Services:  ?The patient was given information about Chronic Care Management services, agreed to services, and gave verbal consent prior to initiation of services.  Please see initial visit note for detailed documentation.  ?Patient agreed to services and verbal consent obtained.  ? ?Assessment: Review of patient past medical history, allergies, medications, health status, including review of consultants reports, laboratory and other test data, was performed as part of comprehensive evaluation and provision of chronic care management services.  ? ?SDOH (Social Determinants of Health) assessments and interventions performed:  ?SDOH Interventions   ? ?Flowsheet Row Most Recent Value  ?SDOH Interventions   ?Food Insecurity Interventions Intervention Not Indicated  [continues to deny food insecurity]  ?Transportation Interventions Intervention Not Indicated  [continues driving self,  daughter assists as/ if indicated]  ? ?  ?CCM Care Plan ? ?No Known Allergies ? ?Outpatient Encounter Medications as of 10/16/2021  ?Medication Sig Note  ? albuterol (PROVENTIL) (2.5 MG/3ML) 0.083% nebulizer solution Take 3 mLs (2.5 mg total) by nebulization every 6 (six) hours as needed for wheezing or shortness of breath. 04/24/2021: 04/24/21-- reports has not needed in several years  ? albuterol (VENTOLIN HFA) 108 (90 Base) MCG/ACT inhaler Inhale 2 puffs into the lungs every 6 (six) hours as needed for wheezing or shortness of breath.  04/24/2021: 04/24/21- reports uses only occasionally  ? atorvastatin (LIPITOR) 40 MG tablet Take 1 tablet (40 mg total) by mouth daily.   ? Carboxymethylcellul-Glycerin (LUBRICATING EYE DROPS OP) Place 1 drop into both eyes 4 (four) times daily as needed (dry eyes).    ? Cholecalciferol (VITAMIN D3) 2000 units capsule Take 2,000 Units by mouth daily.    ? Continuous Blood Gluc Receiver (FREESTYLE LIBRE 14 DAY READER) DEVI 1 Stick by Does not apply route daily.   ? empagliflozin (JARDIANCE) 10 MG TABS tablet Take 5 mg by mouth daily. Filled at [place].   ? fluticasone (FLONASE) 50 MCG/ACT nasal spray Place 1 spray into both nostrils daily.   ? furosemide (LASIX) 40 MG tablet Take 1 tablet (40 mg total) by mouth daily. (Patient taking differently: Take 40 mg by mouth daily as needed (fluid retention.).)   ? gabapentin (NEURONTIN) 100 MG capsule Take 1 capsule (100 mg total) by mouth 2 (two) times daily.   ? insulin glargine (LANTUS) 100 UNIT/ML injection Inject 45 Units into the skin at bedtime.   ? levothyroxine (SYNTHROID) 175 MCG tablet Take 175 mcg by mouth daily before breakfast.   ? losartan (COZAAR) 50 MG tablet Take 1 tablet (50 mg total) by mouth daily.   ? Menthol, Topical Analgesic, (BIOFREEZE EX) Apply 1 application topically 3 (three) times daily as needed (pain. (calf muscle)).   ? metFORMIN (GLUCOPHAGE) 500 MG tablet Take 500-1,000 mg by mouth 2 (two) times daily with a meal. Take 1 tablet (500 mg) by mouth in the morning & take 2 tablets (1000 mg) by mouth in the evening.   ? Multiple Vitamins-Minerals (MEGA MULTIVITAMIN FOR MEN PO) Take 1 tablet by  mouth daily.   ? OXYGEN Inhale 2 L into the lungs continuous.   ? predniSONE (DELTASONE) 5 MG tablet Take 1 tablet (5 mg total) by mouth daily with breakfast.   ? Semaglutide (OZEMPIC, 1 MG/DOSE, [place]) Inject 1 mg into the skin every Friday.   ? tamsulosin (FLOMAX) 0.4 MG CAPS capsule Take 1 capsule (0.4 mg total) by mouth at bedtime.   ? Tiotropium  Bromide-Olodaterol (STIOLTO RESPIMAT) 2.5-2.5 MCG/ACT AERS Inhale 2 puffs into the lungs daily.   ? ?No facility-administered encounter medications on file as of 10/16/2021.  ? ?Patient Active Problem List  ? Diagnosis Date Noted  ? 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 Oxygen dependent 03/05/2021  ? 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  ? Chronic venous hypertension w ulceration (HCC) 12/18/2020  ? Obesity 12/11/2020  ? Osteoarthritis 12/11/2020  ? Other long term (current) drug therapy 12/11/2020  ? Thrombocytopenia (HCC) 12/11/2020  ? Encounter for general adult medical examination with abnormal findings 11/05/2020  ? 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  ? 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  ? Claudication (HCC) 07/03/2020  ? Physical deconditioning 06/08/2020  ? Hemoptysis 02/16/2020  ? Syncope 10/04/2019  ? LBBB (left bundle branch block) 10/04/2019  ? Pacemaker 10/04/2019  ? 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  ? Lung nodules 03/25/2019  ? 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 Symptomatic anemia 01/20/2018  ? BENIGN PROSTATIC HYPERTROPHY 12/2017  ? Chronic respiratory failure with hypoxia (HCC) 12/01/2017  ? PAF (paroxysmal atrial fibrillation) (HCC)   ? Ischemic cardiomyopathy 09/02/2017  ? Chronic systolic CHF (congestive heart failure) (HCC) 09/02/2017  ? Aortic aneurysm (HCC) 09/02/2017  ? Diabetes mellitus with complication, with long-term current use of insulin (HCC) 09/01/2017  ? Anxiety and depression 09/01/2017  ? COPD (chronic obstructive pulmonary disease) (HCC) 09/01/2017  ? CORONARY ARTERY DISEASE 08/28/2016  ? Myogenic ptosis of right eyelid 07/11/2016  ? Open angle with borderline findings, low risk, bilateral 07/11/2016  ? Bilateral dry eyes 04/14/2014  ? 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/14/2014  ? Hyperlipidemia associated with type 2 diabetes mellitus (HCC) 01/05/2014  ? High risk medication use 10/07/2013  ? Pseudophakia of both eyes 10/07/2013  ? Drusen of  left macula 12/01/2012  ? Scleritis of both eyes 12/01/2012  ? 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 PTSD (post-traumatic stress disorder) 03/10/2012  ? 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 Rheumatoid arthritis (HCC) 09/09/2011  ? Scleromalacia 09/09/2011  ? COPD with acute exacerbation (HCC) 08/28/2011  ? OSA on CPAP 06/10/2011  ? 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  ? GAIT DISTURBANCE 12/10/2009  ? Coronary atherosclerosis 11/23/2009  ? Hypothyroidism 07/30/2009  ? Essential hypertension 07/30/2009  ? ?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 addressed/monitored:  COPD and DMII ? ?Care Plan : RN Care Manager Plan of Care  ?Updates made by Michaela Corner, RN since 10/16/2021 12:00 AM  ?  ? ?Problem: Chronic Disease Management Needs   ?Priority: Medium  ?  ? ?Long-Range Goal: Ongoing adherence to established plan of care for long term chronic disease management   ?Start Date: 04/24/2021  ?Expected End Date: 04/24/2022  ?Priority: Medium  ?Note:   ?Current Barriers:  ?Chronic Disease Management support and education needs related to COPD and DMII ?Fragile state of health, multiple progressing chronic health conditions- on continuous home O2 at 2-3 L/min ? ?RNCM Clinical Goal(s):  ?Patient will demonstrate ongoing health management independence with COPD, DMII, multiple chronic health issues  through collaboration with RN Care manager, provider, and care team.  ? ?  Interventions: ?1:1 collaboration with primary care provider regarding development and update of comprehensive plan of care as evidenced by provider attestation and co-signature ?Inter-disciplinary care team collaboration (see longitudinal plan of care) ?Evaluation of current treatment plan related to  self management and patient's adherence to plan as established by provider ?10/16/21: ?Review of patient status, including review of consultants reports, relevant laboratory and other test results, and medications completed ?SDOH updated: no new/ unmet concerns  identified ?Medication discussed: reports no recent changes/ concerns with medications: continues self-managing and verbalizes good general understanding of purpose/ dosing/ scheduling of medications ?Confirmed patient has obtained and is using

## 2021-10-17 LAB — BASIC METABOLIC PANEL
BUN/Creatinine Ratio: 18 (ref 10–24)
BUN: 20 mg/dL (ref 8–27)
CO2: 19 mmol/L — ABNORMAL LOW (ref 20–29)
Calcium: 9.5 mg/dL (ref 8.6–10.2)
Chloride: 101 mmol/L (ref 96–106)
Creatinine, Ser: 1.1 mg/dL (ref 0.76–1.27)
Glucose: 128 mg/dL — ABNORMAL HIGH (ref 70–99)
Potassium: 4.8 mmol/L (ref 3.5–5.2)
Sodium: 140 mmol/L (ref 134–144)
eGFR: 68 mL/min/{1.73_m2} (ref >59)

## 2021-10-22 ENCOUNTER — Other Ambulatory Visit: Payer: Self-pay

## 2021-10-22 ENCOUNTER — Ambulatory Visit (HOSPITAL_BASED_OUTPATIENT_CLINIC_OR_DEPARTMENT_OTHER)
Admission: RE | Admit: 2021-10-22 | Discharge: 2021-10-22 | Disposition: A | Discharge status: Home / Self Care | Payer: Medicare PPO | Source: Ambulatory Visit | Attending: Pulmonary Disease | Admitting: Pulmonary Disease

## 2021-10-22 ENCOUNTER — Encounter (HOSPITAL_BASED_OUTPATIENT_CLINIC_OR_DEPARTMENT_OTHER): Payer: Self-pay

## 2021-10-22 DIAGNOSIS — R918 Other nonspecific abnormal finding of lung field: Secondary | ICD-10-CM | POA: Insufficient documentation

## 2021-10-22 DIAGNOSIS — I7121 Aneurysm of the ascending aorta, without rupture: Secondary | ICD-10-CM | POA: Insufficient documentation

## 2021-10-22 DIAGNOSIS — I712 Thoracic aortic aneurysm, without rupture, unspecified: Secondary | ICD-10-CM | POA: Diagnosis not present

## 2021-10-22 MED ORDER — IOHEXOL 350 MG/ML SOLN
100.0000 mL | Freq: Once | INTRAVENOUS | Status: AC | PRN
  Administered 2021-10-22: 100 mL via INTRAVENOUS

## 2021-10-24 ENCOUNTER — Encounter (HOSPITAL_COMMUNITY)
Admission: RE | Disposition: A | Discharge status: Home / Self Care | Payer: Self-pay | Source: Home / Self Care | Attending: Vascular Surgery

## 2021-10-24 ENCOUNTER — Other Ambulatory Visit: Payer: Self-pay

## 2021-10-24 ENCOUNTER — Ambulatory Visit (HOSPITAL_COMMUNITY)
Admission: RE | Admit: 2021-10-24 | Discharge: 2021-10-24 | Disposition: A | Discharge status: Home / Self Care | Payer: Medicare PPO | Attending: Vascular Surgery | Admitting: Vascular Surgery

## 2021-10-24 DIAGNOSIS — I724 Aneurysm of artery of lower extremity: Secondary | ICD-10-CM | POA: Diagnosis not present

## 2021-10-24 DIAGNOSIS — Z7985 Long-term (current) use of injectable non-insulin antidiabetic drugs: Secondary | ICD-10-CM | POA: Diagnosis not present

## 2021-10-24 DIAGNOSIS — Z794 Long term (current) use of insulin: Secondary | ICD-10-CM | POA: Diagnosis not present

## 2021-10-24 DIAGNOSIS — Z79890 Hormone replacement therapy (postmenopausal): Secondary | ICD-10-CM | POA: Insufficient documentation

## 2021-10-24 DIAGNOSIS — I255 Ischemic cardiomyopathy: Secondary | ICD-10-CM | POA: Insufficient documentation

## 2021-10-24 DIAGNOSIS — Z9981 Dependence on supplemental oxygen: Secondary | ICD-10-CM | POA: Insufficient documentation

## 2021-10-24 DIAGNOSIS — Z79899 Other long term (current) drug therapy: Secondary | ICD-10-CM | POA: Diagnosis not present

## 2021-10-24 DIAGNOSIS — I70222 Atherosclerosis of native arteries of extremities with rest pain, left leg: Secondary | ICD-10-CM | POA: Diagnosis not present

## 2021-10-24 DIAGNOSIS — J449 Chronic obstructive pulmonary disease, unspecified: Secondary | ICD-10-CM | POA: Diagnosis not present

## 2021-10-24 DIAGNOSIS — E7800 Pure hypercholesterolemia, unspecified: Secondary | ICD-10-CM | POA: Insufficient documentation

## 2021-10-24 DIAGNOSIS — I2510 Atherosclerotic heart disease of native coronary artery without angina pectoris: Secondary | ICD-10-CM | POA: Diagnosis not present

## 2021-10-24 DIAGNOSIS — E119 Type 2 diabetes mellitus without complications: Secondary | ICD-10-CM | POA: Insufficient documentation

## 2021-10-24 DIAGNOSIS — I5022 Chronic systolic (congestive) heart failure: Secondary | ICD-10-CM | POA: Diagnosis not present

## 2021-10-24 DIAGNOSIS — Z7984 Long term (current) use of oral hypoglycemic drugs: Secondary | ICD-10-CM | POA: Insufficient documentation

## 2021-10-24 DIAGNOSIS — Y832 Surgical operation with anastomosis, bypass or graft as the cause of abnormal reaction of the patient, or of later complication, without mention of misadventure at the time of the procedure: Secondary | ICD-10-CM | POA: Diagnosis not present

## 2021-10-24 DIAGNOSIS — M069 Rheumatoid arthritis, unspecified: Secondary | ICD-10-CM | POA: Insufficient documentation

## 2021-10-24 DIAGNOSIS — I110 Hypertensive heart disease with heart failure: Secondary | ICD-10-CM | POA: Diagnosis not present

## 2021-10-24 DIAGNOSIS — Z7952 Long term (current) use of systemic steroids: Secondary | ICD-10-CM | POA: Diagnosis not present

## 2021-10-24 DIAGNOSIS — G4733 Obstructive sleep apnea (adult) (pediatric): Secondary | ICD-10-CM | POA: Insufficient documentation

## 2021-10-24 DIAGNOSIS — T85858A Stenosis due to other internal prosthetic devices, implants and grafts, initial encounter: Secondary | ICD-10-CM | POA: Diagnosis not present

## 2021-10-24 DIAGNOSIS — E039 Hypothyroidism, unspecified: Secondary | ICD-10-CM | POA: Diagnosis not present

## 2021-10-24 DIAGNOSIS — Z87891 Personal history of nicotine dependence: Secondary | ICD-10-CM | POA: Insufficient documentation

## 2021-10-24 HISTORY — PX: ABDOMINAL AORTOGRAM W/LOWER EXTREMITY: CATH118223

## 2021-10-24 HISTORY — PX: PERIPHERAL VASCULAR BALLOON ANGIOPLASTY: CATH118281

## 2021-10-24 LAB — POCT ACTIVATED CLOTTING TIME: Activated Clotting Time: 263 seconds

## 2021-10-24 LAB — POCT I-STAT, CHEM 8
BUN: 18 mg/dL (ref 8–23)
Calcium, Ion: 1.22 mmol/L (ref 1.15–1.40)
Chloride: 107 mmol/L (ref 98–111)
Creatinine, Ser: 1.2 mg/dL (ref 0.61–1.24)
Glucose, Bld: 83 mg/dL (ref 70–99)
HCT: 37 % — ABNORMAL LOW (ref 39.0–52.0)
Hemoglobin: 12.6 g/dL — ABNORMAL LOW (ref 13.0–17.0)
Potassium: 3.9 mmol/L (ref 3.5–5.1)
Sodium: 144 mmol/L (ref 135–145)
TCO2: 28 mmol/L (ref 22–32)

## 2021-10-24 LAB — GLUCOSE, CAPILLARY
Glucose-Capillary: 68 mg/dL — ABNORMAL LOW (ref 70–99)
Glucose-Capillary: 103 mg/dL — ABNORMAL HIGH (ref 70–99)

## 2021-10-24 SURGERY — ABDOMINAL AORTOGRAM W/LOWER EXTREMITY
Anesthesia: LOCAL

## 2021-10-24 MED ORDER — MIDAZOLAM HCL 2 MG/2ML IJ SOLN
INTRAMUSCULAR | Status: DC | PRN
  Administered 2021-10-24 (×2): 1 mg via INTRAVENOUS

## 2021-10-24 MED ORDER — LIDOCAINE HCL (PF) 1 % IJ SOLN
INTRAMUSCULAR | Status: AC
  Filled 2021-10-24: qty 30

## 2021-10-24 MED ORDER — HEPARIN SODIUM (PORCINE) 1000 UNIT/ML IJ SOLN
INTRAMUSCULAR | Status: DC | PRN
  Administered 2021-10-24 (×2): 2000 [IU] via INTRAVENOUS
  Administered 2021-10-24: 1000 [IU] via INTRAVENOUS
  Administered 2021-10-24: 9000 [IU] via INTRAVENOUS

## 2021-10-24 MED ORDER — IODIXANOL 320 MG/ML IV SOLN
INTRAVENOUS | Status: DC | PRN
  Administered 2021-10-24: 120 mL via INTRA_ARTERIAL

## 2021-10-24 MED ORDER — CLOPIDOGREL BISULFATE 75 MG PO TABS: 75.0000 mg | ORAL_TABLET | Freq: Every day | ORAL | 11 refills | Status: DC

## 2021-10-24 MED ORDER — LIDOCAINE HCL (PF) 1 % IJ SOLN
INTRAMUSCULAR | Status: DC | PRN
  Administered 2021-10-24: 10 mL

## 2021-10-24 MED ORDER — HEPARIN SODIUM (PORCINE) 1000 UNIT/ML IJ SOLN
INTRAMUSCULAR | Status: AC
  Filled 2021-10-24: qty 10

## 2021-10-24 MED ORDER — FENTANYL CITRATE (PF) 100 MCG/2ML IJ SOLN
INTRAMUSCULAR | Status: AC
Start: 2021-10-24 — End: ?
  Filled 2021-10-24: qty 2

## 2021-10-24 MED ORDER — MIDAZOLAM HCL 2 MG/2ML IJ SOLN
INTRAMUSCULAR | Status: AC
  Filled 2021-10-24: qty 2

## 2021-10-24 MED ORDER — CLOPIDOGREL BISULFATE 300 MG PO TABS
ORAL_TABLET | ORAL | Status: DC | PRN
  Administered 2021-10-24: 300 mg via ORAL

## 2021-10-24 MED ORDER — SODIUM CHLORIDE 0.9 % IV SOLN: INTRAVENOUS | Status: DC

## 2021-10-24 MED ORDER — CLOPIDOGREL BISULFATE 300 MG PO TABS
ORAL_TABLET | ORAL | Status: AC
  Filled 2021-10-24: qty 1

## 2021-10-24 MED ORDER — HEPARIN (PORCINE) IN NACL 1000-0.9 UT/500ML-% IV SOLN
INTRAVENOUS | Status: DC | PRN
  Administered 2021-10-24 (×2): 500 mL

## 2021-10-24 MED ORDER — HEPARIN (PORCINE) IN NACL 1000-0.9 UT/500ML-% IV SOLN
INTRAVENOUS | Status: AC
  Filled 2021-10-24: qty 1000

## 2021-10-24 MED ORDER — FENTANYL CITRATE (PF) 100 MCG/2ML IJ SOLN
INTRAMUSCULAR | Status: DC | PRN
  Administered 2021-10-24 (×2): 25 ug via INTRAVENOUS

## 2021-10-24 SURGICAL SUPPLY — 26 items
BALLN JADE .018 4.0 X 40 (BALLOONS) ×3
BALLOON JADE .018 4.0 X 40 (BALLOONS) IMPLANT
CATH CXI 4F 150 ST (CATHETERS) ×1 IMPLANT
CATH NAVICROSS ST .035X135CM (MICROCATHETER) ×1 IMPLANT
CATH OMNI FLUSH 5F 65CM (CATHETERS) ×1 IMPLANT
CATH QUICKCROSS .035X135CM (MICROCATHETER) ×1 IMPLANT
CATH TEMPO AQUA 5F 100CM (CATHETERS) ×1 IMPLANT
DEVICE CLOSURE MYNXGRIP 6/7F (Vascular Products) ×1 IMPLANT
GLIDEWIRE ADV .035X260CM (WIRE) ×1 IMPLANT
GUIDEWIRE ZILIENT 6G 018 (WIRE) ×1 IMPLANT
KIT ENCORE 26 ADVANTAGE (KITS) ×1 IMPLANT
KIT MICROPUNCTURE NIT STIFF (SHEATH) ×1 IMPLANT
KIT PV (KITS) ×3 IMPLANT
SHEATH GUIDING 6.5 FR 55X73X9 (SHEATH) ×1 IMPLANT
SHEATH PINNACLE 5F 10CM (SHEATH) ×1 IMPLANT
SHEATH PINNACLE 7F 10CM (SHEATH) ×1 IMPLANT
SHEATH PROBE COVER 6X72 (BAG) ×1 IMPLANT
SHEATH SHUTTLE 5F/110 (SHEATH) ×1 IMPLANT
SYR MEDRAD MARK V 150ML (SYRINGE) ×1 IMPLANT
TRANSDUCER W/STOPCOCK (MISCELLANEOUS) ×3 IMPLANT
TRAY PV CATH (CUSTOM PROCEDURE TRAY) ×3 IMPLANT
WIRE AMPLATZ SSTIFF .035X260CM (WIRE) ×1 IMPLANT
WIRE BENTSON .035X145CM (WIRE) ×1 IMPLANT
WIRE G V18X300CM (WIRE) ×3 IMPLANT
WIRE HI TORQ COMMND ES.014X300 (WIRE) ×1 IMPLANT
WIRE SHEPHERD 12G .014 (WIRE) ×1 IMPLANT

## 2021-10-24 NOTE — H&P (Signed)
History and Physical Interval Note: ? ?10/24/2021 ?9:20 AM ? ?[name]  has presented today for surgery, with the diagnosis of bilateral popliteal artery aneurysm.  The various methods of treatment have been discussed with the patient and family. After consideration of risks, benefits and other options for treatment, the patient has consented to  Procedure(s): ?ABDOMINAL AORTOGRAM W/LOWER EXTREMITY (N/A) as a surgical intervention.  The patient's history has been reviewed, patient examined, no change in status, stable for surgery.  I have reviewed the patient's chart and labs.  Questions were answered to the patient's satisfaction.   ? ? ?[name] ? ?Patient name: [name]         MRN: [id number]        DOB: 12-16-1940          Sex: male ?  ?REASON FOR VISIT:.  6 month follow-up for surveillance of bilateral popliteal aneurysm disease ?  ?HPI: ?[name] is a 81 y.o. male with multiple medical comorbidities including COPD (on home oxygen) and CHF who presents for 6 month follow-up of his popliteal aneurysm disease.  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  Of note he does have a history of an aortobiiliac bypass for AAA in 2004.  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 ?  ?On follow-up today states he is overall doing well.  He is not having any significant pain in his legs.  No pain in the feet.  Still remains on oxygen.  Feels that his left leg bypass took almost a year to recover and he still recovering. ?  ?  ?  ?    ?Past Medical History:  ?Diagnosis Date  ? 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  ablated  ? Anxiety    ? Aortic aneurysm (HCC) 09/02/2017  ? BENIGN PROSTATIC HYPERTROPHY 11/23/2009  ? Cardiomyopathy (HCC) 08/28/2016  ? Chronic systolic CHF  (congestive heart failure) (HCC) 09/02/2017  ? COPD (chronic obstructive pulmonary disease) (HCC)    ? CORONARY ARTERY DISEASE 11/23/2009  ? DECREASED HEARING, 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  ? DEGENERATIVE JOINT DISEASE 11/23/2009  ? DEPRESSION 11/23/2009  ? FATIGUE 11/23/2009  ? GAIT DISTURBANCE 12/10/2009  ? HEMOPTYSIS UNSPECIFIED 05/07/2010  ? High cholesterol    ? HYPERTENSION 07/30/2009  ? HYPOTHYROIDISM 07/30/2009  ? Ischemic cardiomyopathy 09/02/2017  ? 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 On home oxygen therapy    ?  "2-3L; 24/7" (09/10/2016)  ? OSA on CPAP    ? Pneumonia    ? PTSD (post-traumatic stress disorder) 03/10/2012  ? 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  ? RA (rheumatoid arthritis) (HCC) 06/11/2011  ?  "qwhere" (09/10/2016)  ? RESPIRATORY FAILURE, CHRONIC 07/31/2009  ? Scleritis of both eyes 03/17/2014  ? Thrombocytopenia (HCC)    ? 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 Type II diabetes mellitus (HCC)    ?  ?  ?     ?Past Surgical History:  ?Procedure Laterality Date  ? ABDOMINAL AORTIC ANEURYSM REPAIR   07/2002  ?  [name] 12/10/2010  ? ABDOMINAL AORTOGRAM W/LOWER EXTREMITY N/A 08/16/2020  ?  Procedure: ABDOMINAL AORTOGRAM W/LOWER EXTREMITY;  Surgeon: Dakota Shelling, MD;  Location: [facility] INVASIVE CV LAB;  Service: Cardiovascular;  Laterality: N/A;  ? ABDOMINAL EXPLORATION SURGERY   02/2004  ?  w/LOA/notes 12/10/2010; small bowel obstruction repair with adhesiolysis   ? BACK SURGERY      ? CARDIAC CATHETERIZATION      ?  2 heart caths in the past.  One in 2000s showed one ulcerated plaque  Rx medically; [facility]  ? CATARACT EXTRACTION W/ INTRAOCULAR LENS  IMPLANT, BILATERAL Bilateral 2000s  ? COLECTOMY      ?  hx of remote ileum resection due to bleeding  ? COLONOSCOPY WITH PROPOFOL N/A 01/22/2018  ?  Procedure: COLONOSCOPY WITH PROPOFOL;  Surgeon: Dakota BoopGessner, Dakota E, MD;  Location: [facility] ENDOSCOPY;  Service: Endoscopy;  Laterality: N/A;  ? CORONARY ANGIOPLASTY WITH STENT PLACEMENT   09/10/2016  ? CORONARY STENT INTERVENTION N/A  09/10/2016  ?  Procedure: Coronary Stent Intervention;  Surgeon: Dakota Hazelhristopher D McAlhany, MD;  [facility];  Service: Cardiovascular;  Laterality: N/A;  Distal RCA 4.0x16 Synergy  ? ESOPHAGOGASTRODUODENOSCOPY (EGD) WITH PROPOFOL N/A 01/22/2018  ?  Procedure: ESOPHAGOGASTRODUODENOSCOPY (EGD) WITH PROPOFOL;  Surgeon: Dakota BoopGessner, Dakota E, MD;  Location: [facility];  Service: Endoscopy;  Laterality: N/A;  ? EYE SURGERY      ? FALSE ANEURYSM REPAIR Left 08/22/2020  ?  Procedure: EXCLUSION OF LEFT POPLITEAL ARTERY ANEURYSM;  Surgeon: Dakota Shellinglark, [name] J, MD;  [facility];  Service: Vascular;  Laterality: Left;  ? FEMORAL EMBOLOECTOMY Left 07/2000  ?  with left leg ischemia; Dr. Hart RochesterLawson, vascular  ? FEMORAL-POPLITEAL BYPASS GRAFT Bilateral 08/22/2020  ?  Procedure: LEFT FEMORAL-POSTERIOR TIBIAL ARTERY BYPASS GRAFT USING RIGHT GREATER NONREVERSED SAPHENOUS VEIN GRAFT;  Surgeon: Dakota Shellinglark, [name] J, MD;  Location: [facility] OR;  Service: Vascular;  Laterality: Bilateral;  ? GANGLION CYST EXCISION Right    ?  "wrist"; Dr. Teressa Aguilar  ? HOT HEMOSTASIS N/A 01/22/2018  ?  Procedure: HOT HEMOSTASIS (ARGON PLASMA COAGULATION/BICAP);  Surgeon: Dakota BoopGessner, Dakota E, MD;  [facility];  Service: Endoscopy;  Laterality: N/A;  ? LOOP RECORDER INSERTION N/A 04/25/2019  ?  Procedure: LOOP RECORDER INSERTION;  Surgeon: [name], Dakota C, MD;  [facility];  Service: Cardiovascular;  Laterality: N/A;  ? LOOP RECORDER REMOVAL N/A 07/01/2019  ?  Procedure: LOOP RECORDER REMOVAL;  Surgeon: [name], Dakota C, MD;  [facility];  Service: Cardiovascular;  Laterality: N/A;  ? LUMBAR LAMINECTOMY   1972  ?  Dr. Fannie Aguilar  ? PACEMAKER IMPLANT N/A 07/01/2019  ?  Procedure: PACEMAKER IMPLANT;  Surgeon: [name], Dakota C, MD;  [facility];  Service: Cardiovascular;  Laterality: N/A;  ? RIGHT/LEFT HEART CATH AND CORONARY ANGIOGRAPHY N/A 09/10/2016  ?  Procedure: Right/Left Heart Cath and Coronary Angiography;   Surgeon: Dakota Hazelhristopher D McAlhany, MD;  [facility];  Service: Cardiovascular;  Laterality: N/A;  ? TONSILLECTOMY      ?  ?  ?     ?Family History  ?Problem Relation Age of Onset  ? Other Mother    ?      gun shot  ?  ?  ?SOCIAL HISTORY: ?Social History  ?  ?     ?Tobacco Use  ? Smoking status: Former  ?    Packs/day: 2.50  ?    Years: 40.00  ?    Pack years: 100.00  ?    Types: Cigarettes, Pipe, Cigars  ?    Quit date: 07/28/1998  ?    Years since quitting: 23.1  ? Smokeless tobacco: Never  ?Substance Use Topics  ?  Alcohol use: No  ?    Alcohol/week: 0.0 standard drinks  ?  ?  ?No Known Allergies ?  ?      ?Current Outpatient Medications  ?Medication Sig Dispense Refill  ? albuterol (PROVENTIL) (2.5 MG/3ML) 0.083% nebulizer solution Take 3 mLs (2.5 mg total) by nebulization every 6 (six) hours as needed for wheezing or shortness of breath. 120 mL 12  ? albuterol (VENTOLIN HFA) 108 (90 Base) MCG/ACT inhaler Inhale 2 puffs into the lungs every 6 (six) hours as needed for wheezing or shortness of breath. 8 g 6  ? atorvastatin (LIPITOR) 40 MG tablet Take 1 tablet (40 mg total) by mouth daily. 90 tablet 3  ? Carboxymethylcellul-Glycerin (LUBRICATING EYE DROPS OP) Place 1 drop into both eyes 4 (four) times daily as needed (dry eyes).       ? Cholecalciferol (VITAMIN D3) 2000 units capsule Take 2,000 Units by mouth daily.       ? Continuous Blood Gluc Receiver (FREESTYLE LIBRE 14 DAY READER) DEVI 1 Stick by Does not apply route daily. 1 each 5  ? empagliflozin (JARDIANCE) 10 MG TABS tablet Take 5 mg by mouth daily. Filled at [place].      ? fluticasone (FLONASE) 50 MCG/ACT nasal spray Place 1 spray into both nostrils daily. 18.2 mL 2  ? insulin glargine (LANTUS) 100 UNIT/ML injection Inject 45 Units into the skin at bedtime.      ? levothyroxine (SYNTHROID) 175 MCG tablet Take 175 mcg by mouth daily before breakfast.      ? losartan (COZAAR) 50 MG tablet Take 1 tablet (50 mg total) by mouth daily. 90 tablet 3  ?  Menthol, Topical Analgesic, (BIOFREEZE EX) Apply 1 application topically 3 (three) times daily as needed (pain. (calf muscle)).      ? metFORMIN (GLUCOPHAGE) 500 MG tablet Take 500-1,000 mg by mouth 2 (two) tim

## 2021-10-24 NOTE — Discharge Instructions (Signed)
Femoral Site Care This sheet gives you information about how to care for yourself after your procedure. Your health care provider may also give you more specific instructions. If you have problems or questions, contact your health care provider. What can I expect after the procedure?  After the procedure, it is common to have: Bruising that usually fades within 1-2 weeks. Tenderness at the site. Follow these instructions at home: Wound care Follow instructions from your health care provider about how to take care of your insertion site. Make sure you: Wash your hands with soap and water before you change your bandage (dressing). If soap and water are not available, use hand sanitizer. Remove your dressing as told by your health care provider. In 24 hours Do not take baths, swim, or use a hot tub until your health care provider approves. You may shower 24-48 hours after the procedure or as told by your health care provider. Gently wash the site with plain soap and water. Pat the area dry with a clean towel. Do not rub the site. This may cause bleeding. Do not apply powder or lotion to the site. Keep the site clean and dry. Check your femoral site every day for signs of infection. Check for: Redness, swelling, or pain. Fluid or blood. Warmth. Pus or a bad smell. Activity For the first 2-3 days after your procedure, or as long as directed: Avoid climbing stairs as much as possible. Do not squat. Do not lift anything that is heavier than 10 lb (4.5 kg), or the limit that you are told, until your health care provider says that it is safe. For 5 days Rest as directed. Avoid sitting for a long time without moving. Get up to take short walks every 1-2 hours. Do not drive for 24 hours if you were given a medicine to help you relax (sedative). General instructions Take over-the-counter and prescription medicines only as told by your health care provider. Keep all follow-up visits as told by  your health care provider. This is important. Contact a health care provider if you have: A fever or chills. You have redness, swelling, or pain around your insertion site. Get help right away if: The catheter insertion area swells very fast. You pass out. You suddenly start to sweat or your skin gets clammy. The catheter insertion area is bleeding, and the bleeding does not stop when you hold steady pressure on the area. The area near or just beyond the catheter insertion site becomes pale, cool, tingly, or numb. These symptoms may represent a serious problem that is an emergency. Do not wait to see if the symptoms will go away. Get medical help right away. Call your local emergency services (911 in the [place] Do not drive yourself to the hospital. Summary After the procedure, it is common to have bruising that usually fades within 1-2 weeks. Check your femoral site every day for signs of infection. Do not lift anything that is heavier than 10 lb (4.5 kg), or the limit that you are told, until your health care provider says that it is safe. This information is not intended to replace advice given to you by your health care provider. Make sure you discuss any questions you have with your health care provider. Document Revised: 07/27/2017 Document Reviewed: 07/27/2017 Elsevier Patient Education  2020 [employer] 

## 2021-10-24 NOTE — Op Note (Signed)
? ? ?Patient name: [name] MRN: [id number] DOB: [birth year] Sex: male ? 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  Ultrasound-guided access right common femoral artery ?2.  Aortogram with catheter selection of aorta ?3.  Bilateral lower extremity arteriogram including selection of third order branches in the left lower extremity  ?4.  Angioplasty of distal left common femoral to PT vein bypass including the anastomosis and proximal posterior tibial artery (4 mm x 40 mm Jade angioplasty balloon) ?5.  122 minutes of monitored moderate conscious sedation time ?6.  Mynx closure of the right common femoral artery ? ?Contrast: 120 mL ? ?Indications: Patient is a 81 year old male who previously underwent a left common femoral to posterior tibial bypass with GSV for an occluded popliteal aneurysm with critical ischemia with rest pain in the left foot on 08/22/20.  Recent surveillance duplex in the office showed high-grade stenosis in the distal bypass.  He presents today for angiogram with a focus on the left leg given high-grade stenosis in the distal bypass. ? ?Findings:  ? 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  The aortobiiliac graft is widely patent with no flow-limiting stenosis. ? ?Left lower extremity which is the side of interest showed a patent common femoral to posterior tibial bypass except for the distal vein graft including the anastomosis that had multiple focal high-grade stenosis greater than 80% and was diseased.  Dominant runoff is through the posterior tibial artery although there was retrograde flow into the popliteal artery with peroneal and anterior tibial runoff although the anterior tibial occludes shortly after takeoff.  There is a high takeoff of the  posterior tibial artery as previously demonstrated.  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 ? ?We had a significant challenge getting sheaths and catheters to track over the bifurcation given steep bifurcation from previous aortobiiliac graft.  Ultimately used the Oscor steerable sheath to cross the bifurcation and I could get down the vein bypass but I had very limited pushability and finally got a 5 [place] long sheath through the steerable sheath into the bypass distally and then crossed the anastomosis into the posterior tibial artery and the anastomosis, proximal PT artery, and distal bypass vein graft was all angioplastied with a 4 mm angioplasty balloon to nominal pressure for 2 minutes.  Excellent results with no residual stenosis and brisk runoff. ?  ?Procedure:  The patient was identified in the holding area and taken to room 8.  The patient was then placed supine on the table and prepped and draped in the usual sterile fashion.  A time out was called.  Ultrasound was used to evaluate the right common femoral artery.  It was patent .  A digital ultrasound image was acquired.  A micropuncture needle was used to access the right common femoral artery under ultrasound guidance.  An 018 wire was advanced without resistance and a micropuncture sheath was placed.  The 018 wire was removed and a benson wire was placed.  The micropuncture sheath was exchanged for a 5 french sheath.  An omniflush catheter was advanced over the wire to the level of L-1.  An abdominal angiogram was obtained.  Next the catheter was pulled down and bilateral lower extremity runoff was obtained.  Pertinent  findings are noted above.  Ultimately the focus is on the left leg given a high-grade stenosis in the distal vein graft including the anastomosis onto the posterior tibial artery.  I then gave the patient 100 units/kg IV heparin.  I used 6.5 [place]  Oscar steerable sheath that was then directed over the aortobiiliac graft to the contralateral side.  I then was able to get a Glidewire advantage down into the left leg bypass and ultimately used a glide cath given a straight catheter would not track.  I really did not have enough pushability given my Oscar steerable sheath kept getting kicked into the aorta above his previous graft.  Finally using a number of different wires and catheters I got enough purchase to push a long 5 French sheath through the Oscor steerable sheath into the mid segment of the left leg bypass.  I then used a number of wires including a V18 as well as weighted Shepperd wires and finally crossed the anastomosis but again had limited pushability and finally was able to get my balloon down after significant effort and angioplasty of the distal vein graft including the anastomosis onto the posterior tibial artery and the proximal posterior tibial artery using a 4 mm x 40 mm angioplasty balloon to nominal pressure for 2 minutes.  Excellent results with no significant residual stenosis and preserved runoff in the posterior tibial.  Wires and catheters were removed.  A mynx closure was deployed in the right common femoral artery. ? ?Plan: Excellent results today with widely patent left leg bypass after intervention.  We will load on Plavix and see in 1 month with non-invasive imaging.   ? ? ? ?[name], MD ?[facility] ?Office: [phone] ? ? ?

## 2021-10-25 ENCOUNTER — Encounter (HOSPITAL_COMMUNITY): Payer: Self-pay | Admitting: Vascular Surgery

## 2021-10-25 DIAGNOSIS — Z794 Long term (current) use of insulin: Secondary | ICD-10-CM | POA: Diagnosis not present

## 2021-10-25 DIAGNOSIS — E1159 Type 2 diabetes mellitus with other circulatory complications: Secondary | ICD-10-CM | POA: Diagnosis not present

## 2021-10-25 DIAGNOSIS — J449 Chronic obstructive pulmonary disease, unspecified: Secondary | ICD-10-CM

## 2021-10-25 DIAGNOSIS — Z7984 Long term (current) use of oral hypoglycemic drugs: Secondary | ICD-10-CM

## 2021-11-08 ENCOUNTER — Telehealth: Payer: Medicare PPO

## 2021-11-08 ENCOUNTER — Encounter: Payer: Self-pay | Admitting: *Deleted

## 2021-11-08 ENCOUNTER — Telehealth: Payer: Self-pay | Admitting: *Deleted

## 2021-11-08 NOTE — Telephone Encounter (Signed)
?  Chronic Care Management  ? ?Follow Up Note ? ? ?11/08/2021 ?Name: [name] MRN: [id number] DOB: [birth year] ? ?Referred by: Myrlene Broker, MD ?Reason for referral : Chronic Care Management (CCM RN CM Follow Up Telephone Visit- unsuccessful attempt) ? ?An unsuccessful telephone outreach was attempted today. The patient was referred to the case management team for assistance with care management and care coordination.  ? ?Follow Up Plan:  ?A HIPPA compliant phone message was left for the patient providing contact information and requesting a return call ?Will place request with scheduling care guide to contact patient to re-schedule today's missed CCM RN follow up telephone appointment if I do not hear back from patient by end of day ? ?[name], RN, BSN, CCRN Alumnus ?CCM Clinic RN Care Coordination- [facility] ?([phone]: direct office ? ? ?

## 2021-11-19 ENCOUNTER — Other Ambulatory Visit: Payer: Self-pay

## 2021-11-19 DIAGNOSIS — I724 Aneurysm of artery of lower extremity: Secondary | ICD-10-CM

## 2021-11-22 ENCOUNTER — Telehealth: Payer: Self-pay

## 2021-11-22 ENCOUNTER — Ambulatory Visit: Payer: Medicare PPO

## 2021-11-22 NOTE — Telephone Encounter (Signed)
Patient  was scheduled for 1045 , care guide called patient who declined to reschedule ,Due to being behind this am. Called patient x3 at 1200noon  with no answer.  Please reschedule patient for next available appointment. ? ?L.Antoney Biven,LPN  ?

## 2021-11-25 ENCOUNTER — Ambulatory Visit: Payer: Medicare PPO | Admitting: Adult Health

## 2021-11-25 ENCOUNTER — Encounter: Payer: Self-pay | Admitting: Adult Health

## 2021-11-25 DIAGNOSIS — R5381 Other malaise: Secondary | ICD-10-CM

## 2021-11-25 DIAGNOSIS — J9611 Chronic respiratory failure with hypoxia: Secondary | ICD-10-CM

## 2021-11-25 DIAGNOSIS — I5022 Chronic systolic (congestive) heart failure: Secondary | ICD-10-CM | POA: Diagnosis not present

## 2021-11-25 DIAGNOSIS — R918 Other nonspecific abnormal finding of lung field: Secondary | ICD-10-CM

## 2021-11-25 DIAGNOSIS — G4733 Obstructive sleep apnea (adult) (pediatric): Secondary | ICD-10-CM

## 2021-11-25 DIAGNOSIS — Z9989 Dependence on other enabling machines and devices: Secondary | ICD-10-CM

## 2021-11-25 DIAGNOSIS — I7121 Aneurysm of the ascending aorta, without rupture: Secondary | ICD-10-CM | POA: Diagnosis not present

## 2021-11-25 DIAGNOSIS — J449 Chronic obstructive pulmonary disease, unspecified: Secondary | ICD-10-CM

## 2021-11-25 NOTE — Assessment & Plan Note (Signed)
Continue annual surveillance.  CT results were sent to Dr. Stanford Breed.  Also he has follow-up with vascular next week. ?

## 2021-11-25 NOTE — Assessment & Plan Note (Signed)
Appears euvolemic on exam  ? ?Plan  ?Patient Instructions  ?Continue on Stiolto 2 puffs daily .  ?Mucinex DM Twice daily  As needed  Cough/congestion .  ?Continue on Oxygen 2l/m rest and 3l/m with activity .  ?Continue on CPAP At bedtime wear with Naps.  ?Activity as tolerated.  ?Saline nasal rinses Twice daily   ?Saline nasal gel At bedtime .  ?Follow up in 3-4 Months  with Dr. Vassie Loll or Fredrich Cory NP and As needed   ?Please contact office for sooner follow up if symptoms do not improve or worsen or seek emergency care  ? ? ? ?

## 2021-11-25 NOTE — Patient Instructions (Signed)
Continue on Stiolto 2 puffs daily .  Mucinex DM Twice daily  As needed  Cough/congestion .  Continue on Oxygen 2l/m rest and 3l/m with activity .  Continue on CPAP At bedtime wear with Naps.  Activity as tolerated.  Saline nasal rinses Twice daily   Saline nasal gel At bedtime .  Follow up in 3-4 Months  with Dr. Alva or Oluwademilade Kellett NP and As needed   Please contact office for sooner follow up if symptoms do not improve or worsen or seek emergency care   

## 2021-11-25 NOTE — Progress Notes (Signed)
? ?@Patient  ID: [name], male    DOB: Jul 16, 1941, 81 y.o.   MRN: [id number] ? ?Chief Complaint  ?Patient presents with  ? Follow-up  ? ? ?Referring provider: ?[name], * ? ?HPI: ?81 year old male former smoker followed for very severe COPD, chronic hypoxic respiratory failure on oxygen, obstructive sleep apnea on nocturnal CPAP.  History of lung nodules followed on serial CT chest ?Medical history significant for rheumatoid arthritis not on maintenance medications, diabetes, congestive heart failure, coronary artery disease status post RCA stent, chronic kidney disease, A-fib with previous use of amiodarone that was stopped in August 2019.  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 ?TEST/EVENTS :  ?PSG 04/2014 showed severe OSA, AHI 48/h with nadir desatn 78% correctd by CPAP 12 cm, 3 L O2 blended in , c flex +2 cm, humidity, A medium full face mask was used. 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 Desaturations persisted without resp events on CPAP 12 cm  ?  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 ?PFT 10/2015 FEV1 was 36%, ratio 46, FVC 58%, DLCO 26% ?  ?  ?CT chest 12/2016 -masslike consolidation in LUL ?  ?PET scan [id number] >+metabolic act in LUL consolidation  ?  ?CT chest 08/2017 >> 2.4 x 3.5 cm triangular subpleural opacity in the anterior left upper lobe > has resolved the platelike scarring bilateral nodular infiltrates stable, ?  ?CT chest 09/2020 severe emphysema, extensive pleural calcification.  Previous nodule in the  right lower lobe has significantly decreased in size measuring 0.8 x 0.8 cm previously at 2.3 cm.  A stable left upper lobe cavitary nodule measuring 4 mm.  And a stable irregular nodule in the left lower lobe measuring 1.5 cm, and a left lower lobe nodule measuring 1.5 cm ? ?11/25/2021 Follow up : COPD , OSA , O2 RF  ?Patient returns for a 35-month follow-up.  He has underlying severe COPD that is oxygen dependent.  Overall he says he is doing okay.  He is becoming more sedentary.  Says that his multiple medical problems are starting to slow him down.  He recently underwent intervention for occluded popliteal aneurysm last month.  Previously had a left common femoral to popliteal tibial bypass with SCV for occluded popliteal artery with critical ischemia.  Routine surveillance showed high-grade stenosis in the left leg bypass with critical limb ischemia.  He underwent angioplasty of the distal left common femoral to popliteal vein bypass including the anastomosis and proximal posterior tibial artery with angioplasty balloon.  Patient says since procedure he is doing well.  Has some chronic swelling in the left leg.  Says it is harder for him to get around and he does not walk as much as he used to.  Uses a four-point cane now due to balance issues. ?Patient denies any flare of cough or wheezing.  Remains on Stiolto daily.  Remains on oxygen 2 L at rest and 3 L with activity.  This is his baseline. ?Patient underwent a CT chest October 22, 2021 that showed a stable uncomplicated aneurysmal dilatation along the  aortic arch.  Interval increase of the abdominal aorta from 33 mm to 36 mm.,  Advanced emphysema.  Stable chronic extensive pleural calcifications, no worrisome pulmonary nodules.  Ill-defined left lower lobe pulmonary nodules unchanged since October 2020. ?Extensive cholelithiasis.  Patient denies any abdominal pain or nausea vomiting. ?Patient denies any hemoptysis. ?Patient continues on CPAP.  Says he is doing  well.  Wears a CPAP every day.  Denies any significant sleepiness.  CPAP download shows 100% compliance.  Daily average usage around 10 hours.  Patient is on auto CPAP 10 to 20 cm H2O.  AHI 1.8/hour. ? ?No Known Allergies ? ?Immunization History  ?Administered Date(s) Administered  ? Fluad Quad(high Dose 65+) 04/26/2019, 06/08/2020, 05/01/2021  ? Influenza Split 04/14/2011, 04/27/2013, 03/28/2014  ? Influenza Whole 05/28/2009, 04/27/2010, 03/31/2012  ? Influenza, High Dose Seasonal PF 04/10/2010, 04/11/2011, 04/19/2013, 03/21/2015, 04/05/2016, 05/16/2017, 03/28/2018, 04/08/2018  ? Influenza,inj,Quad PF,6+ Mos 03/17/2014, 04/04/2016  ? Influenza-Unspecified 04/30/2006, 04/28/2007, 04/28/2008, 05/29/2009, 04/10/2010, 05/26/2011, 05/07/2012, 05/28/2020, 05/01/2021  ? PFIZER Comirnaty(Gray Top)Covid-19 Tri-Sucrose Vaccine 11/29/2020, 05/01/2021  ? PFIZER(Purple Top)SARS-COV-2 Vaccination 08/19/2019, 09/09/2019, 04/08/2020, 12/09/2020  ? Pneumococcal Conjugate-13 09/20/2013, 03/29/2015  ? Pneumococcal Polysaccharide-23 05/28/2008, 04/25/2010, 04/19/2013  ? Pneumococcal-Unspecified 06/26/2006, 04/27/2013  ? Td 11/23/2009  ? Tdap 07/17/2011  ? Zoster, Live 04/28/2009, 11/08/2009  ? ? ?Past Medical History:  ?Diagnosis Date  ? 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 ablated  ? Anxiety   ? Aortic aneurysm ([place]) 09/02/2017  ? BENIGN PROSTATIC HYPERTROPHY 11/23/2009  ? Cardiomyopathy ([place]) 08/28/2016  ? Chronic systolic CHF (congestive heart failure) ([place]) 09/02/2017  ? COPD (chronic obstructive pulmonary disease) ([place])   ? CORONARY ARTERY DISEASE 11/23/2009  ? DECREASED HEARING, 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  ? DEGENERATIVE JOINT DISEASE 11/23/2009  ? DEPRESSION 11/23/2009  ? FATIGUE 11/23/2009  ? GAIT DISTURBANCE 12/10/2009  ? HEMOPTYSIS UNSPECIFIED 05/07/2010  ? High cholesterol   ? HYPERTENSION 07/30/2009  ? HYPOTHYROIDISM 07/30/2009  ? Ischemic cardiomyopathy 09/02/2017  ? 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 On home oxygen therapy   ? "2-3L; 24/7"  (09/10/2016)  ? OSA on CPAP   ? Pneumonia   ? PTSD (post-traumatic stress disorder) 03/10/2012  ? 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  ? RA (rheumatoid arthritis) ([place]) 06/11/2011  ? "qwhere" (09/10/2016)  ? RESPIRATORY FAILURE, CHRONIC 07/31/2009  ? Scleritis of both eyes 03/17/2014  ? Thrombocytopenia ([place])   ? 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 Type II diabetes mellitus ([place])   ? ? ?Tobacco History: ?Social History  ? ?Tobacco Use  ?Smoking Status Former  ? Packs/day: 2.50  ? Years: 40.00  ? Pack years: 100.00  ? Types: Cigarettes, Pipe, Cigars  ? Quit date: 07/28/1998  ? Years since quitting: 23.3  ?Smokeless Tobacco Never  ? ?Counseling given: Not Answered ? ? ?Outpatient Medications Prior to Visit  ?Medication Sig Dispense Refill  ? albuterol (PROVENTIL) (2.5 MG/3ML) 0.083% nebulizer solution Take 3 mLs (2.5 mg total) by nebulization every 6 (six) hours as needed for wheezing or shortness of breath. 120 mL 12  ? albuterol (VENTOLIN HFA) 108 (90 Base) MCG/ACT inhaler Inhale 2 puffs into the lungs every 6 (six) hours as needed for wheezing or shortness of breath. 8 g 6  ? atorvastatin (LIPITOR) 40 MG tablet Take 1 tablet (40 mg total) by mouth daily. 90 tablet 3  ? Carboxymethylcellul-Glycerin (LUBRICATING EYE DROPS OP) Place 1 drop into both eyes 4 (four) times daily as needed (dry eyes).     ? Cholecalciferol (VITAMIN D3) 2000 units capsule Take 2,000 Units  by mouth daily.     ? clopidogrel (PLAVIX) 75 MG tablet Take 1 tablet (75 mg total) by mouth daily. 30 tablet 11  ? Continuous Blood Gluc Transmit (DEXCOM G6 TRANSMITTER) MISC by Does not apply route.    ? empagliflozin (JARDIANCE) 10 MG TABS tablet Take 5 mg by mouth daily. Filled at [place].    ? fluticasone (FLONASE) 50 MCG/ACT nasal spray Place 1 spray into both nostrils daily. 18.2 mL 2  ? gabapentin (NEURONTIN) 100 MG capsule Take 100 mg by mouth at bedtime.    ? insulin glargine (LANTUS) 100 UNIT/ML injection Inject 40 Units into the skin at bedtime.    ? levothyroxine (SYNTHROID)  175 MCG tablet Take 175 mcg by mouth daily before breakfast.    ? losartan (COZAAR) 50 MG tablet Take 1 tablet (50 mg total) by mouth daily. 90 tablet 3  ? metFORMIN (GLUCOPHAGE) 500 MG tablet Take 500-1,0

## 2021-11-25 NOTE — Assessment & Plan Note (Signed)
Activity as tolerated

## 2021-11-25 NOTE — Assessment & Plan Note (Signed)
Continue on O2 2l/m rest and 3l/m w/ act  ?

## 2021-11-25 NOTE — Assessment & Plan Note (Signed)
Excellent control and compliance on CPAP.  Continue same settings ? ?Plan ?Patient Instructions  ?Continue on Stiolto 2 puffs daily .  ?Mucinex DM Twice daily  As needed  Cough/congestion .  ?Continue on Oxygen 2l/m rest and 3l/m with activity .  ?Continue on CPAP At bedtime wear with Naps.  ?Activity as tolerated.  ?Saline nasal rinses Twice daily   ?Saline nasal gel At bedtime .  ?Follow up in 3-4 Months  with Dr. Vassie Loll or Anuj Summons NP and As needed   ?Please contact office for sooner follow up if symptoms do not improve or worsen or seek emergency care  ? ? ? ?

## 2021-11-25 NOTE — Assessment & Plan Note (Signed)
Appears stable on CT scan ?

## 2021-11-25 NOTE — Assessment & Plan Note (Signed)
Compensated on present regimen  ? ?Plan ?Patient Instructions  ?Continue on Stiolto 2 puffs daily .  ?Mucinex DM Twice daily  As needed  Cough/congestion .  ?Continue on Oxygen 2l/m rest and 3l/m with activity .  ?Continue on CPAP At bedtime wear with Naps.  ?Activity as tolerated.  ?Saline nasal rinses Twice daily   ?Saline nasal gel At bedtime .  ?Follow up in 3-4 Months  with Dr. Vassie Loll or Yumna Ebers NP and As needed   ?Please contact office for sooner follow up if symptoms do not improve or worsen or seek emergency care  ? ?  ? ?

## 2021-11-27 ENCOUNTER — Telehealth: Payer: Medicare PPO

## 2021-11-29 ENCOUNTER — Telehealth: Payer: Self-pay | Admitting: *Deleted

## 2021-11-29 NOTE — Chronic Care Management (AMB) (Signed)
?  Care Coordination ?Note ? ?11/29/2021 ?Name: [name] MRN: [id number] DOB: 1940/08/31 ? ?[name] is a 81 y.o. year old male who is a primary care patient of [name], MD and is actively engaged with the care management team. I reached out to [name] by phone today to assist with re-scheduling a follow up visit with the RN Case Manager ? ?Follow up plan: ?Unsuccessful telephone outreach attempt made. A HIPAA compliant phone message was left for the patient providing contact information and requesting a return call.  ?The care management team will reach out to the patient again over the next 7 days.  ?If patient returns call to provider office, please advise to call [place] at [phone]. ? ?Laverda Sorenson  ?Care Guide, Embedded Care Coordination ?[facility]  ?Direct Dial: [phone] ? ?

## 2021-12-03 ENCOUNTER — Ambulatory Visit (INDEPENDENT_AMBULATORY_CARE_PROVIDER_SITE_OTHER)
Admission: RE | Admit: 2021-12-03 | Discharge: 2021-12-03 | Disposition: A | Discharge status: Home / Self Care | Payer: Medicare PPO | Source: Ambulatory Visit | Attending: Vascular Surgery | Admitting: Vascular Surgery

## 2021-12-03 ENCOUNTER — Ambulatory Visit (HOSPITAL_COMMUNITY)
Admission: RE | Admit: 2021-12-03 | Discharge: 2021-12-03 | Disposition: A | Discharge status: Home / Self Care | Payer: Medicare PPO | Source: Ambulatory Visit | Attending: Vascular Surgery | Admitting: Vascular Surgery

## 2021-12-03 ENCOUNTER — Ambulatory Visit (INDEPENDENT_AMBULATORY_CARE_PROVIDER_SITE_OTHER): Payer: Medicare PPO | Admitting: Vascular Surgery

## 2021-12-03 ENCOUNTER — Encounter: Payer: Self-pay | Admitting: Vascular Surgery

## 2021-12-03 VITALS — BP 167/85 | HR 62 | Temp 97.7°F | Resp 18 | Ht 74.0 in | Wt 206.0 lb

## 2021-12-03 DIAGNOSIS — I724 Aneurysm of artery of lower extremity: Secondary | ICD-10-CM

## 2021-12-03 NOTE — Progress Notes (Signed)
? ?Patient name: [name] MRN: [id number] DOB: [birth year] Sex: male ? ?REASON FOR VISIT:.  Follow-up after recent intervention on stenosis in left leg bypass ? ?HPI: ?[name] is a 81 y.o. male with multiple medical comorbidities including COPD (on home oxygen) and CHF who presents for follow-up after recent intervention on left leg bypass.  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  Most recently on 10/24/2021 he underwent angioplasty of the distal left common femoral to PT vein bypass with a 4 mm angioplasty balloon given high grade stenosis.  States he is doing fine postop.  No issues with the groin access site. Still on Plavix. ? ? ?Of note he does have a history of an aortobiiliac bypass for AAA in 2004.  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  He has been delaying intervention on the right popliteal aneurysm given this is not good anatomy for stent graft and he does not feel he can sustain another bypass ? ? ? ? ? ?Past Medical History:  ?Diagnosis Date  ? 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 ablated  ? Anxiety   ? Aortic aneurysm ([place]) 09/02/2017  ? BENIGN PROSTATIC HYPERTROPHY 11/23/2009  ? Cardiomyopathy ([place]) 08/28/2016  ? Chronic systolic CHF (congestive heart failure) ([place]) 09/02/2017  ? COPD (chronic obstructive pulmonary disease) ([place])   ? CORONARY ARTERY DISEASE 11/23/2009  ? DECREASED HEARING, 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  ? DEGENERATIVE JOINT DISEASE 11/23/2009  ? DEPRESSION 11/23/2009  ? FATIGUE 11/23/2009  ? GAIT DISTURBANCE 12/10/2009  ? HEMOPTYSIS UNSPECIFIED 05/07/2010  ? High cholesterol   ? HYPERTENSION 07/30/2009  ? HYPOTHYROIDISM 07/30/2009  ? Ischemic cardiomyopathy 09/02/2017  ? 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 On home oxygen therapy   ? "2-3L; 24/7" (09/10/2016)  ?  OSA on CPAP   ? Pneumonia   ? PTSD (post-traumatic stress disorder) 03/10/2012  ? 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  ? RA (rheumatoid arthritis) ([place]) 06/11/2011  ? "qwhere" (09/10/2016)  ? RESPIRATORY FAILURE, CHRONIC 07/31/2009  ? Scleritis of both eyes 03/17/2014  ? Thrombocytopenia ([place])   ? 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 Type II diabetes mellitus ([place])   ? ? ?Past Surgical History:  ?Procedure Laterality Date  ? ABDOMINAL AORTIC ANEURYSM REPAIR  07/2002  ? Archie Endo 12/10/2010  ? ABDOMINAL AORTOGRAM W/LOWER EXTREMITY N/A 08/16/2020  ? Procedure: ABDOMINAL AORTOGRAM W/LOWER EXTREMITY;  Surgeon: [name], MD;  Location: [facility];  Service: Cardiovascular;  Laterality: N/A;  ? ABDOMINAL AORTOGRAM W/LOWER EXTREMITY N/A 10/24/2021  ? Procedure: ABDOMINAL AORTOGRAM W/LOWER EXTREMITY;  Surgeon: [name], MD;  Location: [facility];  Service: Cardiovascular;  Laterality: N/A;  ? ABDOMINAL EXPLORATION SURGERY  02/2004  ? w/LOA/notes 12/10/2010; small bowel obstruction repair with adhesiolysis   ? BACK SURGERY    ? CARDIAC CATHETERIZATION    ? 2 heart caths in the past.  One in 2000s showed one ulcerated plaque  Rx medically; Second at [facility] /notes 09/05/2016  ? CATARACT EXTRACTION W/ INTRAOCULAR LENS  IMPLANT, BILATERAL Bilateral 2000s  ? COLECTOMY    ? hx of remote ileum resection due to bleeding  ? COLONOSCOPY WITH PROPOFOL N/A 01/22/2018  ? Procedure: COLONOSCOPY WITH PROPOFOL;  Surgeon: Gatha Mayer, MD;  Location: [facility];  Service: Endoscopy;  Laterality:  N/A;  ? CORONARY ANGIOPLASTY WITH STENT PLACEMENT  09/10/2016  ? CORONARY STENT INTERVENTION N/A 09/10/2016  ? Procedure: Coronary Stent Intervention;  Surgeon: Burnell Blanks, MD;  Location: [facility];  Service: Cardiovascular;  Laterality: N/A;  Distal RCA 4.0x16 Synergy  ? ESOPHAGOGASTRODUODENOSCOPY (EGD) WITH PROPOFOL N/A 01/22/2018  ? Procedure: ESOPHAGOGASTRODUODENOSCOPY (EGD) WITH PROPOFOL;  Surgeon: Gatha Mayer, MD;   Location: [facility];  Service: Endoscopy;  Laterality: N/A;  ? EYE SURGERY    ? FALSE ANEURYSM REPAIR Left 08/22/2020  ? Procedure: EXCLUSION OF LEFT POPLITEAL ARTERY ANEURYSM;  Surgeon: [name], MD;  Location: [facility];  Service: Vascular;  Laterality: Left;  ? FEMORAL EMBOLOECTOMY Left 07/2000  ? with left leg ischemia; Dr. Kellie Simmering, vascular  ? FEMORAL-POPLITEAL BYPASS GRAFT Bilateral 08/22/2020  ? Procedure: LEFT FEMORAL-POSTERIOR TIBIAL ARTERY BYPASS GRAFT USING RIGHT GREATER NONREVERSED SAPHENOUS VEIN GRAFT;  Surgeon: [name], MD;  Location: [facility];  Service: Vascular;  Laterality: Bilateral;  ? GANGLION CYST EXCISION Right   ? "wrist"; Dr. Daylene Katayama  ? HOT HEMOSTASIS N/A 01/22/2018  ? Procedure: HOT HEMOSTASIS (ARGON PLASMA COAGULATION/BICAP);  Surgeon: Gatha Mayer, MD;  Location: [facility];  Service: Endoscopy;  Laterality: N/A;  ? LOOP RECORDER INSERTION N/A 04/25/2019  ? Procedure: LOOP RECORDER INSERTION;  Surgeon: Deboraha Sprang, MD;  Location: [facility];  Service: Cardiovascular;  Laterality: N/A;  ? LOOP RECORDER REMOVAL N/A 07/01/2019  ? Procedure: LOOP RECORDER REMOVAL;  Surgeon: Deboraha Sprang, MD;  Location: [facility];  Service: Cardiovascular;  Laterality: N/A;  ? [place]  ? Dr. Collie Siad  ? PACEMAKER IMPLANT N/A 07/01/2019  ? Procedure: PACEMAKER IMPLANT;  Surgeon: Deboraha Sprang, MD;  Location: [facility];  Service: Cardiovascular;  Laterality: N/A;  ? PERIPHERAL VASCULAR BALLOON ANGIOPLASTY Left 10/24/2021  ? Procedure: PERIPHERAL VASCULAR BALLOON ANGIOPLASTY;  Surgeon: [name], MD;  Location: [facility];  Service: Cardiovascular;  Laterality: Left;  ? RIGHT/LEFT HEART CATH AND CORONARY ANGIOGRAPHY N/A 09/10/2016  ? Procedure: Right/Left Heart Cath and Coronary Angiography;  Surgeon: Burnell Blanks, MD;  Location: [facility];  Service: Cardiovascular;  Laterality: N/A;  ? TONSILLECTOMY    ? ? ?Family  History  ?Problem Relation Age of Onset  ? Other Mother   ?     gun shot  ? ? ?SOCIAL HISTORY: ?Social History  ? ?Tobacco Use  ? Smoking status: Former  ?  Packs/day: 2.50  ?  Years: 40.00  ?  Pack years: 100.00  ?  Types: Cigarettes, Pipe, Cigars  ?  Quit date: 07/28/1998  ?  Years since quitting: 23.3  ? Smokeless tobacco: Never  ?Substance Use Topics  ? Alcohol use: No  ?  Alcohol/week: 0.0 standard drinks  ? ? ?No Known Allergies ? ?Current Outpatient Medications  ?Medication Sig Dispense Refill  ? albuterol (PROVENTIL) (2.5 MG/3ML) 0.083% nebulizer solution Take 3 mLs (2.5 mg total) by nebulization every 6 (six) hours as needed for wheezing or shortness of breath. 120 mL 12  ? albuterol (VENTOLIN HFA) 108 (90 Base) MCG/ACT inhaler Inhale 2 puffs into the lungs every 6 (six) hours as needed for wheezing or shortness of breath. 8 g 6  ? atorvastatin (LIPITOR) 40 MG tablet Take 1 tablet (40 mg total) by mouth daily. 90 tablet 3  ? Carboxymethylcellul-Glycerin (LUBRICATING EYE DROPS OP) Place 1 drop into both eyes 4 (four) times daily  as needed (dry eyes).     ? Cholecalciferol (VITAMIN D3) 2000 units capsule Take 2,000 Units by mouth daily.     ? clopidogrel (PLAVIX) 75 MG tablet Take 1 tablet (75 mg total) by mouth daily. 30 tablet 11  ? Continuous Blood Gluc Transmit (DEXCOM G6 TRANSMITTER) MISC by Does not apply route.    ? empagliflozin (JARDIANCE) 10 MG TABS tablet Take 5 mg by mouth daily. Filled at [place].    ? fluticasone (FLONASE) 50 MCG/ACT nasal spray Place 1 spray into both nostrils daily. 18.2 mL 2  ? gabapentin (NEURONTIN) 100 MG capsule Take 100 mg by mouth at bedtime.    ? insulin glargine (LANTUS) 100 UNIT/ML injection Inject 40 Units into the skin at bedtime.    ? levothyroxine (SYNTHROID) 175 MCG tablet Take 175 mcg by mouth daily before breakfast.    ? losartan (COZAAR) 50 MG tablet Take 1 tablet (50 mg total) by mouth daily. 90 tablet 3  ? metFORMIN (GLUCOPHAGE) 500 MG tablet Take 500-1,000 mg by  mouth 2 (two) times daily with a meal. Take 1 tablet (500 mg) by mouth in the morning & take 2 tablets (1000 mg) by mouth in the evening.    ? Multiple Vitamins-Minerals (MEGA MULTIVITAMIN FOR MEN PO) Ta

## 2021-12-06 ENCOUNTER — Other Ambulatory Visit: Payer: Self-pay | Admitting: *Deleted

## 2021-12-06 DIAGNOSIS — I724 Aneurysm of artery of lower extremity: Secondary | ICD-10-CM

## 2021-12-06 DIAGNOSIS — E118 Type 2 diabetes mellitus with unspecified complications: Secondary | ICD-10-CM | POA: Diagnosis not present

## 2021-12-06 DIAGNOSIS — I83022 Varicose veins of left lower extremity with ulcer of calf: Secondary | ICD-10-CM

## 2021-12-06 DIAGNOSIS — L97229 Non-pressure chronic ulcer of left calf with unspecified severity: Secondary | ICD-10-CM

## 2021-12-09 NOTE — Chronic Care Management (AMB) (Signed)
?  Chronic Care Management ?Note ? ?12/09/2021 ?Name: [name] MRN: [id number] DOB: 11-22-1940 ? ?[name] is a 81 y.o. year old male who is a primary care patient of Myrlene Broker, MD and is actively engaged with the care management team. I reached out to [name] by phone today to assist with re-scheduling a follow up visit with the RN Case Manager ? ?Follow up plan: ?Telephone appointment with care management team member scheduled for:12/13/21 ? ?Gwenevere Ghazi  ?Care Guide, Embedded Care Coordination ?[place]  Care Management  ?Direct Dial: [phone] ? ?

## 2021-12-13 ENCOUNTER — Ambulatory Visit (INDEPENDENT_AMBULATORY_CARE_PROVIDER_SITE_OTHER): Payer: Medicare PPO | Admitting: *Deleted

## 2021-12-13 DIAGNOSIS — Z794 Long term (current) use of insulin: Secondary | ICD-10-CM

## 2021-12-13 DIAGNOSIS — J449 Chronic obstructive pulmonary disease, unspecified: Secondary | ICD-10-CM

## 2021-12-13 NOTE — Patient Instructions (Signed)
Visit Information  [place], thank you for taking time to talk with me today. Please don't hesitate to contact me if I can be of assistance to you before our next scheduled telephone appointment  Below are the goals we discussed today:  Patient Self-Care Activities: Patient [name] will: Take medications as prescribed   Attend all scheduled provider appointments  Call pharmacy for medication refills  Call provider office for new concerns or questions  Continue to follow heart healthy, low salt, low carbohydrate, low sugar diet Continue to monitor blood sugars at home regularly using your continuous glucose monitor Today you reported an updated A1-C of 7.6-- great job using your Dexcom monitor-- this is a significant decrease from your last A1-C result: keep up the great work! Remain active and continue your established exercise program at home If you continue to be bothered by your recent (L) foot (sole) pain, please make an appointment with Dr. Sharlet Salina to have this evaluated Keep up the great work preventing falls at home: continue using your cane as needed  Our next scheduled telephone follow up visit/ appointment is scheduled on: Thursday, March 13, 2022 at 9:00 am- This is a PHONE [place] appointment  If you need to cancel or re-schedule our visit, please call [phone] and our care guide team will be happy to assist you.   I look forward to hearing about your progress.   Oneta Rack, RN, BSN, [place] [phone]: direct office  If you are experiencing a Mental Health or [place] or need someone to talk to, please  call the Suicide and Crisis Lifeline: 988 call the [place] National Suicide Prevention Lifeline: 562-563-8755 or TTY: 343-861-3084 TTY (343)459-6257) to talk to a trained counselor call 1-800-273-TALK (toll free, 24 hour hotline) go to [facility] [address], [place] [phone]) call 911   Patient verbalizes understanding of instructions and care plan provided today and agrees to view in [place]. Active MyChart status and patient understanding of how to access instructions and care plan via MyChart confirmed with patient  Exercise Information for Aging Adults Staying physically active is important as you age. Physical activity and exercise can help in maintaining quality of life, health, physical function, and reducing falls. The four types of exercises that are best for older adults are endurance, strength, balance, and flexibility. Contact your health care provider before you start any exercise routine. Ask your health care provider what activities are safe for you. What are the risks? Risks associated with exercising include: Overdoing it. This may lead to sore muscles or fatigue. Falls. Injuries. Dehydration. How to do these exercises Endurance exercises Endurance (aerobic) exercises raise your breathing rate and heart rate. Increasing your endurance helps you do everyday tasks and stay healthy. By improving the health of your body system that includes your heart, lungs, and blood vessels (circulatory system), you may also delay or prevent diseases such as heart disease, diabetes, and weak bones (osteoporosis). Types of endurance exercises include: Sports. Indoor activities, such as using gym equipment, doing water aerobics, or dancing. Outdoor activities, such as biking or jogging. Tasks around the house, such as gardening, yard work, and heavy household chores like cleaning. Walking, such as hiking or walking around your neighborhood. When doing endurance exercises, make sure you: Are aware of your surroundings. Use safety equipment as directed. Dress in layers when exercising outdoors. Drink plenty of water to stay well hydrated. Build  up endurance slowly. Start with 10 minutes at a time, and gradually build up to  doing 30 minutes at a time. Unless your health care provider gave you different instructions, aim to exercise for a total of 150 minutes a week. Spread out that time so you are working on endurance 3 or more days a week. Strength exercises Lifting, pulling, or pushing weights helps to strengthen muscles. Having stronger muscles makes it easier to do everyday activities, such as getting up from a chair, climbing stairs, carrying groceries, and playing with grandchildren. Strength exercises include arm and leg exercises that may be done: With weights. Without weights (using your own body weight). With a resistance band. When doing strength exercises: Move smoothly and steadily. Do not suddenly thrust or jerk the weights, the resistance band, or your body. Start with no weights or with light weights, and gradually add more weight over time. Eventually, aim to use weights that are hard or very hard for you to lift. This means that you are able to do 8 repetitions with the weight, and the last few repetitions are very challenging. Lift or push weights into position for 3 seconds, hold the position for 1 second, and then take 3 seconds to return to your starting position. Breathe out (exhale) during difficult movements, like lifting or pushing weights. Breathe in (inhale) to relax your muscles before the next repetition. Consider alternating arms or legs, especially when you first start strength exercises. Expect some slight muscle soreness after each session. Do strength exercises on 2 or more days a week, for 30 minutes at a time. Avoid exercising the same muscle groups two days in a row. For example, if you work on your leg muscles one day, work on your arm muscles the next day. When you can do two sets of 10-15 repetitions with a certain weight, increase the amount of weight. Balance exercises Balance exercises can help to prevent falls. Balance exercises include: Standing on one foot. Heel-to-toe  walk. Balance walk. Tai chi. Make sure you have something sturdy to hold onto while doing balance exercises, such as a sturdy chair. As your balance improves, challenge yourself by holding on to the chair with one hand instead of two, and then with no hands. Trying exercises with your eyes closed also challenges your balance, but be sure to have a sturdy surface (like a countertop) close by in case you need it. Do balance exercises as often as you want, or as often as directed by your health care provider. Flexibility exercises  Flexibility exercises improve how far you can bend, straighten, move, or rotate parts of your body (range of motion). These exercises also help you do everyday activities such as getting dressed or reaching for objects. Flexibility exercises include stretching different parts of the body, and they may be done in a standing or seated position or on the floor. When stretching, make sure you: Keep a slight bend in your arms and legs. Avoid completely straightening ("locking") your joints. Do not stretch so far that you feel pain. You should feel a mild stretching feeling. You may try stretching farther as you become more flexible over time. Relax and breathe between stretches. Hold on to something sturdy for balance as needed. Hold each stretch for 10-30 seconds. Repeat each stretch 3-5 times. General safety tips Exercise in well-lit areas. Do not hold your breath during exercises or stretches. Warm up before exercising, and cool down after exercising. This can help prevent injury. Drink plenty  of water during exercise or any activity that makes you sweat. If you are not sure if an exercise is safe for you, or you are not sure how to do an exercise, talk with your health care provider. This is especially important if you have had surgery on muscles, bones, or joints (orthopedic surgery). Where to find more information You can find more information about exercise for older  adults from: Your local health department, fitness center, or community center. These facilities may have programs for aging adults. [employer] on Aging: http://kim-miller.com/ National Council on Aging: www.ncoa.org Summary Staying physically active is important as you age. Doing endurance, strength, balance, and flexibility exercises can help in maintaining quality of life, health, physical function, and reducing falls. Make sure to contact your health care provider before you start any exercise routine. Ask your health care provider what activities are safe for you. This information is not intended to replace advice given to you by your health care provider. Make sure you discuss any questions you have with your health care provider. Document Revised: 11/26/2020 Document Reviewed: 11/26/2020 Elsevier Patient Education  [place].

## 2021-12-13 NOTE — Chronic Care Management (AMB) (Signed)
Chronic Care Management   CCM RN Visit Note  12/13/2021 Name: AYDREN REGISTER MRN: [id number] DOB: [birth year]  Subjective: [name] is a 81 y.o. year old male who is a primary care patient of [name], MD. The care management team was consulted for assistance with disease management and care coordination needs.    Engaged with patient by telephone for follow up visit in response to provider referral for case management and/or care coordination services.   Consent to Services:  The patient was given information about Chronic Care Management services, agreed to services, and gave verbal consent prior to initiation of services.  Please see initial visit note for detailed documentation.  Patient agreed to services and verbal consent obtained.   Assessment: Review of patient past medical history, allergies, medications, health status, including review of consultants reports, laboratory and other test data, was performed as part of comprehensive evaluation and provision of chronic care management services.   SDOH (Social Determinants of Health) assessments and interventions performed:  SDOH Interventions    Flowsheet Row Most Recent Value  SDOH Interventions   Food Insecurity Interventions Intervention Not Indicated  [continues to deny food insecurity]  Housing Interventions Intervention Not Indicated  [continues living alone in single family home,  daughter visits regularly,  denies housing concerns]  Transportation Interventions Intervention Not Indicated  [reports continues to drive self]     CCM Care Plan  No Known Allergies  Outpatient Encounter Medications as of 12/13/2021  Medication Sig   albuterol (PROVENTIL) (2.5 MG/3ML) 0.083% nebulizer solution Take 3 mLs (2.5 mg total) by nebulization every 6 (six) hours as needed for wheezing or shortness of breath.   albuterol (VENTOLIN HFA) 108 (90 Base) MCG/ACT inhaler Inhale 2 puffs into the lungs every 6 (six)  hours as needed for wheezing or shortness of breath.   atorvastatin (LIPITOR) 40 MG tablet Take 1 tablet (40 mg total) by mouth daily.   Carboxymethylcellul-Glycerin (LUBRICATING EYE DROPS OP) Place 1 drop into both eyes 4 (four) times daily as needed (dry eyes).    Cholecalciferol (VITAMIN D3) 2000 units capsule Take 2,000 Units by mouth daily.    clopidogrel (PLAVIX) 75 MG tablet Take 1 tablet (75 mg total) by mouth daily.   Continuous Blood Gluc Transmit (DEXCOM G6 TRANSMITTER) MISC by Does not apply route.   empagliflozin (JARDIANCE) 10 MG TABS tablet Take 5 mg by mouth daily. Filled at [place].   fluticasone (FLONASE) 50 MCG/ACT nasal spray Place 1 spray into both nostrils daily.   gabapentin (NEURONTIN) 100 MG capsule Take 100 mg by mouth at bedtime.   insulin glargine (LANTUS) 100 UNIT/ML injection Inject 40 Units into the skin at bedtime.   levothyroxine (SYNTHROID) 175 MCG tablet Take 175 mcg by mouth daily before breakfast.   losartan (COZAAR) 50 MG tablet Take 1 tablet (50 mg total) by mouth daily.   metFORMIN (GLUCOPHAGE) 500 MG tablet Take 500-1,000 mg by mouth 2 (two) times daily with a meal. Take 1 tablet (500 mg) by mouth in the morning & take 2 tablets (1000 mg) by mouth in the evening.   Multiple Vitamins-Minerals (MEGA MULTIVITAMIN FOR MEN PO) Take 1 tablet by mouth daily.   OXYGEN Inhale 2 L into the lungs continuous.   predniSONE (DELTASONE) 5 MG tablet Take 1 tablet (5 mg total) by mouth daily with breakfast.   Semaglutide (OZEMPIC, 1 MG/DOSE, [place]) Inject 1 mg into the skin every Friday.   tamsulosin (FLOMAX) 0.4 MG CAPS capsule Take  1 capsule (0.4 mg total) by mouth at bedtime.   Tiotropium Bromide-Olodaterol (STIOLTO RESPIMAT) 2.5-2.5 MCG/ACT AERS Inhale 2 puffs into the lungs daily.   No facility-administered encounter medications on file as of 12/13/2021.   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   Claudication ([place]) 07/03/2020   Physical deconditioning 06/08/2020   Hemoptysis 02/16/2020   Syncope 10/04/2019   LBBB (left bundle branch block) 10/04/2019   Pacemaker 10/04/2019   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   Lung nodules 03/25/2019   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   Symptomatic anemia 01/20/2018   BENIGN PROSTATIC HYPERTROPHY 12/2017   Chronic respiratory failure with hypoxia ([place]) 12/01/2017   PAF (paroxysmal atrial fibrillation) ([place])    Ischemic cardiomyopathy 99991111   Chronic systolic CHF (congestive heart failure) ([place]) 09/02/2017   Aortic aneurysm ([place]) 09/02/2017   Diabetes mellitus with complication, with long-term current use of insulin ([place]) 09/01/2017   Anxiety and depression 09/01/2017   COPD (chronic obstructive pulmonary disease) ([place]) 09/01/2017   CORONARY ARTERY DISEASE 08/28/2016   Myogenic ptosis of right eyelid 07/11/2016   Open angle with borderline findings, low risk, bilateral 07/11/2016   Bilateral dry eyes 04/14/2014   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/14/2014   Hyperlipidemia associated with type 2 diabetes mellitus ([place]) 01/05/2014   High risk medication use 10/07/2013   Pseudophakia of both eyes 10/07/2013   [place] of left macula 12/01/2012   Scleritis of both eyes 12/01/2012   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   PTSD (post-traumatic stress disorder) 03/10/2012   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   Rheumatoid arthritis ([place]) 09/09/2011   Scleromalacia 09/09/2011   COPD with acute  exacerbation ([place]) 08/28/2011   OSA on CPAP 06/10/2011   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   GAIT DISTURBANCE 12/10/2009   Coronary atherosclerosis 11/23/2009   Hypothyroidism 07/30/2009   Essential hypertension 07/30/2009   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 addressed/monitored:  COPD and DMII  Care Plan : RN Care Manager Plan of Care  Updates made by Knox Royalty, RN since 12/13/2021 12:00 AM     Problem: Chronic Disease Management Needs   Priority: Medium     Long-Range Goal: Ongoing adherence to established plan of care for long term chronic disease management   Start Date: 04/24/2021  Expected End Date: 04/24/2022  Priority: Medium  Note:   Current Barriers:  Chronic Disease Management support and education needs related to COPD and DMII Fragile state of health, multiple progressing chronic health conditions- on continuous home O2 at 2-3 L/min October 24, 2021 as outpatient for planned/ elective (L) fem pop stenting: discharged home to self care  RNCM Clinical Goal(s):  Patient will demonstrate ongoing health management independence with COPD, DMII, multiple chronic health issues  through collaboration with RN Care manager, provider, and care team.   Interventions: 1:1 collaboration with primary care provider regarding development and update of comprehensive plan of care as evidenced by provider attestation and co-signature Inter-disciplinary care team collaboration (see longitudinal plan of care) Evaluation of current treatment plan related to  self  management and patient's adherence to plan as established by provider Review of patient status, including review of consultants reports, relevant laboratory and other test results, and medications completed SDOH updated: no new/ unmet concerns identified Depression screening updated: no concerns identified Pain assessment updated: reports new intermittent/ episodic (L) foot (sole) pain, that "happens at night;" reports is spontaneously relieved  without specific intervention- states he discussed this pain with vascular provider at office visit 12/03/21- no changes or advice provided; denies pain today Advised to schedule office visit with PCP if his reported new episodes of sole/ foot pain pain worsen/ continue to bother him- he verbalizes understanding and agreement Falls assessment updated: continues to deny new/ recent falls x 12 months- continues using cane as/ if indicated;  positive reinforcement provided with encouragement to continue efforts at fall prevention; previously provided education around fall risks/ prevention reinforced Reviewed recent planned/ elective surgery/ hospitalization 10/24/21 for (L) fem-pop stenting: states visit went well; denies clinical concerns/ issues/ problems post-hospital discharge Reviewed recent provider office visits: verbalizes good understanding of post-office visit instructions/ plans of care 11/25/21- pulmonary provider; confirms no changes made 12/03/21- vascular provider: reports "got good report," no changes made "Last week;" [facility] endocrinology provider: reports "my A1-C is down to 7.6;" reports no changes made to overall plan of care: patient very happy about this result, "it is down from over 10.0;" positive reinforcement provided with encouragement to continue efforts-- states his endocrinologist at [facility] told him "her A1-C goal for me is 8.0, but my personal goal is 7.0" Medications discussed: reports continues to independently self-manage and denies current concerns/ issues/ questions around medications; endorses adherence to taking all medications as prescribed Reviewed upcoming scheduled provider appointments: 02/25/22- pulmonary provider; 03/04/22- vascular surgeon provider office visit; patient confirms is aware of all and has plans to attend as scheduled Discussed plans with patient for ongoing care management follow up and provided patient with direct contact information for care management team      COPD: (Status: 12/13/21: Goal on Track (progressing): YES.) Long term Goal Advised patient to track and manage COPD triggers Advised patient to self assesses COPD action plan zone and make appointment with provider if in the yellow zone for 48 hours without improvement Advised patient to engage in light exercise as tolerated 3-5 days a week to aid in the the management of COPD Discussed the importance of adequate rest and management of fatigue with COPD Discussed current clinical condition: reports at baseline with breathing, "no concerns or problems;" continues using maintenance inhaler as prescribed; rarely using rescue inhaler, continues using home O2 and CPAP as prescribed  Reinforced previously provided education action plan for COPD flares: patient has good baseline understanding of same; managing COPD well at home on day-to-day basis; will benefit from ongoing education, support, reinforcement Confirmed patient remains independent, active and regularly exercises- positive reinforcement provided; benefits of maintaining activity and exercise routine over time discussed, encouraged patient to pace self and not over-do activity: he reports to me today that he and his daughter planted a small garden outside and have plans to "go fishing tomorrow" Confirmed patient follows low salt diet- positive reinforcement provided  Diabetes:  (Status: 12/13/21: Goal on Track (progressing): YES.) Long Term Goal Lab Results  Component Value Date   HGBA1C 10.3 (H) 08/21/2020  Provided education to patient about basic DM disease process; Reviewed prescribed diet with patient low sugar/ carbohydrate, low salt, heart healthy; Counseled on importance of regular laboratory monitoring as prescribed;  Confirmed patient continues using CGM- not having any problems; "loves it;" acknowledged with patient that he has said CGM "would help him get control" of blood sugars since I became involved in patient's care--  he is very appreciative of PCP arranging CGM, as he had ongoing difficulty trying to get it from [place] endocrinologist Discussed/ reviewed general ranges of blood sugars from CGM: he does not report specific values today, but tells me "my blood sugars are much better, in general" Assessed patient's understanding of meaning/ significance of A1-C values: good baseline understanding of same- Reviewed individual historical A1-C trends and provided education around correlation of A1-C value to blood sugar levels at home over 3 months Discussed/ provided verbal education around benefits of exercise in setting of DMII/ COPD- positive reinforcement provided for patient staying regularly active/ following exercise routine at home  Patient Goals/Self-Care Activities: As evidenced by review of EHR, collaboration with care team, and patient reporting during CCM RN CM outreach,  Patient [place] will: Take medications as prescribed   Attend all scheduled provider appointments  Call pharmacy for medication refills  Call provider office for new concerns or questions  Continue to follow heart healthy, low salt, low carbohydrate, low sugar diet Continue to monitor blood sugars at home regularly using your continuous glucose monitor Today you reported an updated A1-C of 7.6-- great job using your Dexcom monitor-- this is a significant decrease from your last A1-C result: keep up the great work! Remain active and continue your established exercise program at home If you continue to be bothered by your recent (L) foot (sole) pain, please make an appointment with Dr. Sharlet Salina to have this evaluated Keep up the great work preventing falls at home: continue using your cane as needed    Plan: Telephone follow up appointment with care management team member scheduled for: Thursday, March 13, 2022 at 9:00 am The patient has been provided with contact information for the care management team and has been advised to call  with any health related questions or concerns   Oneta Rack, RN, BSN, [place] [phone]: direct office

## 2021-12-25 DIAGNOSIS — Z794 Long term (current) use of insulin: Secondary | ICD-10-CM

## 2021-12-25 DIAGNOSIS — E1159 Type 2 diabetes mellitus with other circulatory complications: Secondary | ICD-10-CM

## 2021-12-25 DIAGNOSIS — J449 Chronic obstructive pulmonary disease, unspecified: Secondary | ICD-10-CM

## 2021-12-25 DIAGNOSIS — Z7984 Long term (current) use of oral hypoglycemic drugs: Secondary | ICD-10-CM

## 2021-12-27 ENCOUNTER — Ambulatory Visit (INDEPENDENT_AMBULATORY_CARE_PROVIDER_SITE_OTHER): Payer: Medicare PPO

## 2021-12-27 DIAGNOSIS — I255 Ischemic cardiomyopathy: Secondary | ICD-10-CM | POA: Diagnosis not present

## 2021-12-29 LAB — CUP PACEART REMOTE DEVICE CHECK
Battery Remaining Longevity: 134 mo
Battery Voltage: 3.01 V
Brady Statistic AP VP Percent: 46.36 %
Brady Statistic AP VS Percent: 1.48 %
Brady Statistic AS VP Percent: 21.15 %
Brady Statistic AS VS Percent: 31.01 %
Brady Statistic RA Percent Paced: 49.31 %
Brady Statistic RV Percent Paced: 67.5 %
Date Time Interrogation Session: 20230601230522
Implantable Lead Implant Date: 20201204
Implantable Lead Implant Date: 20201204
Implantable Lead Location: 753859
Implantable Lead Location: 753860
Implantable Lead Model: 5076
Implantable Lead Model: 5076
Implantable Pulse Generator Implant Date: 20201204
Lead Channel Impedance Value: 342 Ohm
Lead Channel Impedance Value: 342 Ohm
Lead Channel Impedance Value: 399 Ohm
Lead Channel Impedance Value: 399 Ohm
Lead Channel Pacing Threshold Amplitude: 0.5 V
Lead Channel Pacing Threshold Amplitude: 0.625 V
Lead Channel Pacing Threshold Pulse Width: 0.4 ms
Lead Channel Pacing Threshold Pulse Width: 0.4 ms
Lead Channel Sensing Intrinsic Amplitude: 2 mV
Lead Channel Sensing Intrinsic Amplitude: 2 mV
Lead Channel Sensing Intrinsic Amplitude: 7.125 mV
Lead Channel Sensing Intrinsic Amplitude: 7.125 mV
Lead Channel Setting Pacing Amplitude: 1.5 V
Lead Channel Setting Pacing Amplitude: 2.5 V
Lead Channel Setting Pacing Pulse Width: 0.4 ms
Lead Channel Setting Sensing Sensitivity: 2 mV

## 2022-01-03 NOTE — Progress Notes (Signed)
Remote pacemaker transmission.   

## 2022-01-13 ENCOUNTER — Telehealth: Payer: Self-pay | Admitting: Internal Medicine

## 2022-01-13 NOTE — Telephone Encounter (Signed)
Left message for patient to call back to schedule Medicare Annual Wellness Visit   Last AWV 11/05/20  Please schedule at anytime with [facility]-Nurse Health Advisor if patient calls the office back.     Any questions, please call me at [phone] 

## 2022-01-17 ENCOUNTER — Ambulatory Visit (INDEPENDENT_AMBULATORY_CARE_PROVIDER_SITE_OTHER): Payer: Medicare PPO

## 2022-01-17 DIAGNOSIS — Z0000 Encounter for general adult medical examination without abnormal findings: Secondary | ICD-10-CM | POA: Diagnosis not present

## 2022-01-17 NOTE — Progress Notes (Signed)
I connected with [name] today by telephone and verified that I am speaking with the correct person using two identifiers. Location patient: home Location provider: work Persons participating in the virtual visit: patient, provider.   I discussed the limitations, risks, security and privacy concerns of performing an evaluation and management service by telephone and the availability of in person appointments. I also discussed with the patient that there may be a patient responsible charge related to this service. The patient expressed understanding and verbally consented to this telephonic visit.    Interactive audio and video telecommunications were attempted between this provider and patient, however failed, due to patient having technical difficulties OR patient did not have access to video capability.  We continued and completed visit with audio only.  Some vital signs may be absent or patient reported.   Time Spent with patient on telephone encounter: 30 minutes Subjective:   [name] is a 81 y.o. male who presents for Medicare Annual/Subsequent preventive examination.  Review of Systems     Cardiac Risk Factors include: advanced age (>36men, >12 women);diabetes mellitus;dyslipidemia;hypertension;male gender     Objective:    Today's Vitals   01/17/22 1042  PainSc: 0-No pain   There is no height or weight on file to calculate BMI.     01/17/2022   10:36 AM 08/22/2020    9:00 AM 08/21/2020   10:19 AM 08/16/2020    7:37 AM 07/11/2020    2:27 PM 07/01/2019   11:14 AM 04/25/2019    9:32 AM  Advanced Directives  Does Patient Have a Medical Advance Directive? Yes Yes Yes Yes No Yes Yes  Type of Social research officer, government Power of [employer] Power of [employer] Power of [place];Living will Healthcare Power of [place];Living will  Does patient want to make changes to medical advance  directive? No - Patient declined  No - Patient declined    No - Patient declined  Copy of Healthcare Power of Attorney in Chart? No - copy requested No - copy requested No - copy requested    No - copy requested  Would patient like information on creating a medical advance directive?  No - Patient declined   No - Patient declined      Current Medications (verified) Outpatient Encounter Medications as of 01/17/2022  Medication Sig   albuterol (PROVENTIL) (2.5 MG/3ML) 0.083% nebulizer solution Take 3 mLs (2.5 mg total) by nebulization every 6 (six) hours as needed for wheezing or shortness of breath.   albuterol (VENTOLIN HFA) 108 (90 Base) MCG/ACT inhaler Inhale 2 puffs into the lungs every 6 (six) hours as needed for wheezing or shortness of breath.   atorvastatin (LIPITOR) 40 MG tablet Take 1 tablet (40 mg total) by mouth daily.   Carboxymethylcellul-Glycerin (LUBRICATING EYE DROPS OP) Place 1 drop into both eyes 4 (four) times daily as needed (dry eyes).    Cholecalciferol (VITAMIN D3) 2000 units capsule Take 2,000 Units by mouth daily.    clopidogrel (PLAVIX) 75 MG tablet Take 1 tablet (75 mg total) by mouth daily.   Continuous Blood Gluc Transmit (DEXCOM G6 TRANSMITTER) MISC by Does not apply route.   empagliflozin (JARDIANCE) 10 MG TABS tablet Take 5 mg by mouth daily. Filled at [place].   fluticasone (FLONASE) 50 MCG/ACT nasal spray Place 1 spray into both nostrils daily.   gabapentin (NEURONTIN) 100 MG capsule Take 100 mg by mouth at bedtime.   insulin glargine (  LANTUS) 100 UNIT/ML injection Inject 40 Units into the skin at bedtime.   levothyroxine (SYNTHROID) 175 MCG tablet Take 175 mcg by mouth daily before breakfast.   losartan (COZAAR) 50 MG tablet Take 1 tablet (50 mg total) by mouth daily.   metFORMIN (GLUCOPHAGE) 500 MG tablet Take 500-1,000 mg by mouth 2 (two) times daily with a meal. Take 1 tablet (500 mg) by mouth in the morning & take 2 tablets (1000 mg) by mouth in the evening.    Multiple Vitamins-Minerals (MEGA MULTIVITAMIN FOR MEN PO) Take 1 tablet by mouth daily.   OXYGEN Inhale 2 L into the lungs continuous.   predniSONE (DELTASONE) 5 MG tablet Take 1 tablet (5 mg total) by mouth daily with breakfast.   Semaglutide (OZEMPIC, 1 MG/DOSE, [place]) Inject 1 mg into the skin every Friday.   tamsulosin (FLOMAX) 0.4 MG CAPS capsule Take 1 capsule (0.4 mg total) by mouth at bedtime.   Tiotropium Bromide-Olodaterol (STIOLTO RESPIMAT) 2.5-2.5 MCG/ACT AERS Inhale 2 puffs into the lungs daily.   No facility-administered encounter medications on file as of 01/17/2022.    Allergies (verified) Patient has no known allergies.   History: Past Medical History:  Diagnosis Date   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   ablated   Anxiety    Aortic aneurysm (HCC) 09/02/2017   BENIGN PROSTATIC HYPERTROPHY 11/23/2009   Cardiomyopathy (HCC) 08/28/2016   Chronic systolic CHF (congestive heart failure) (HCC) 09/02/2017   COPD (chronic obstructive pulmonary disease) (HCC)    CORONARY ARTERY DISEASE 11/23/2009   DECREASED HEARING, 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   DEGENERATIVE JOINT DISEASE 11/23/2009   DEPRESSION 11/23/2009   FATIGUE 11/23/2009   GAIT DISTURBANCE 12/10/2009   HEMOPTYSIS UNSPECIFIED 05/07/2010   High cholesterol    HYPERTENSION 07/30/2009   HYPOTHYROIDISM 07/30/2009   Ischemic cardiomyopathy 09/02/2017   LUMBAR RADICULOPATHY, 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   On home oxygen therapy    "2-3L; 24/7" (09/10/2016)   OSA on CPAP    Pneumonia    PTSD (post-traumatic stress disorder) 03/10/2012   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   RA (rheumatoid arthritis) (HCC) 06/11/2011   "qwhere" (09/10/2016)   RESPIRATORY FAILURE, CHRONIC 07/31/2009   Scleritis of both eyes 03/17/2014   Thrombocytopenia (HCC)    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   Type II diabetes mellitus (HCC)    Past Surgical History:  Procedure Laterality Date   ABDOMINAL AORTIC ANEURYSM REPAIR  07/2002   [name] 12/10/2010   ABDOMINAL AORTOGRAM W/LOWER EXTREMITY N/A 08/16/2020    Procedure: ABDOMINAL AORTOGRAM W/LOWER EXTREMITY;  Surgeon: Cephus Shelling, MD;  Location: [facility] INVASIVE CV LAB;  Service: Cardiovascular;  Laterality: N/A;   ABDOMINAL AORTOGRAM W/LOWER EXTREMITY N/A 10/24/2021   Procedure: ABDOMINAL AORTOGRAM W/LOWER EXTREMITY;  Surgeon: Cephus Shelling, MD;  Location: [facility] INVASIVE CV LAB;  Service: Cardiovascular;  Laterality: N/A;   ABDOMINAL EXPLORATION SURGERY  02/2004   w/LOA/notes 12/10/2010; small bowel obstruction repair with adhesiolysis    BACK SURGERY     CARDIAC CATHETERIZATION     2 heart caths in the past.  One in 2000s showed one ulcerated plaque  Rx medically; [facility] /notes 09/05/2016   CATARACT EXTRACTION W/ INTRAOCULAR LENS  IMPLANT, BILATERAL Bilateral 2000s   COLECTOMY     hx of remote ileum resection due to bleeding   COLONOSCOPY WITH PROPOFOL N/A 01/22/2018   Procedure: COLONOSCOPY WITH PROPOFOL;  Surgeon: Iva Boop, MD;  Location: [facility] ENDOSCOPY;  Service: Endoscopy;  Laterality: N/A;   CORONARY ANGIOPLASTY WITH STENT PLACEMENT  09/10/2016  CORONARY STENT INTERVENTION N/A 09/10/2016   Procedure: Coronary Stent Intervention;  Surgeon: Kathleene Hazel, MD;  Location: [facility] INVASIVE CV LAB;  Service: Cardiovascular;  Laterality: N/A;  Distal RCA 4.0x16 Synergy   ESOPHAGOGASTRODUODENOSCOPY (EGD) WITH PROPOFOL N/A 01/22/2018   Procedure: ESOPHAGOGASTRODUODENOSCOPY (EGD) WITH PROPOFOL;  Surgeon: Iva Boop, MD;  Location: [facility] ENDOSCOPY;  Service: Endoscopy;  Laterality: N/A;   EYE SURGERY     FALSE ANEURYSM REPAIR Left 08/22/2020   Procedure: EXCLUSION OF LEFT POPLITEAL ARTERY ANEURYSM;  Surgeon: Cephus Shelling, MD;  Location: [facility] OR;  Service: Vascular;  Laterality: Left;   FEMORAL EMBOLOECTOMY Left 07/2000   with left leg ischemia; Dr. Hart [place], vascular   FEMORAL-POPLITEAL BYPASS GRAFT Bilateral 08/22/2020   Procedure: LEFT FEMORAL-POSTERIOR TIBIAL ARTERY BYPASS GRAFT USING RIGHT GREATER NONREVERSED SAPHENOUS VEIN  GRAFT;  Surgeon: Cephus Shelling, MD;  Location: [facility] OR;  Service: Vascular;  Laterality: Bilateral;   GANGLION CYST EXCISION Right    "wrist"; Dr. Teressa Senter   HOT HEMOSTASIS N/A 01/22/2018   Procedure: HOT HEMOSTASIS (ARGON PLASMA COAGULATION/BICAP);  Surgeon: Iva Boop, MD;  Location: [facility];  Service: Endoscopy;  Laterality: N/A;   LOOP RECORDER INSERTION N/A 04/25/2019   Procedure: LOOP RECORDER INSERTION;  Surgeon: Duke Salvia, MD;  Location: [facility] INVASIVE CV LAB;  Service: Cardiovascular;  Laterality: N/A;   LOOP RECORDER REMOVAL N/A 07/01/2019   Procedure: LOOP RECORDER REMOVAL;  Surgeon: Duke Salvia, MD;  Location: [facility] INVASIVE CV LAB;  Service: Cardiovascular;  Laterality: N/A;   LUMBAR LAMINECTOMY  1972   Dr. Fannie Knee   PACEMAKER IMPLANT N/A 07/01/2019   Procedure: PACEMAKER IMPLANT;  Surgeon: Duke Salvia, MD;  Location: [facility] INVASIVE CV LAB;  Service: Cardiovascular;  Laterality: N/A;   PERIPHERAL VASCULAR BALLOON ANGIOPLASTY Left 10/24/2021   Procedure: PERIPHERAL VASCULAR BALLOON ANGIOPLASTY;  Surgeon: Cephus Shelling, MD;  Location: [facility] INVAS[facility] CV LAB;  Service: Cardiovascular;  Laterality: Left;   RIGHT/LEFT HEART CATH AND CORONARY ANGIOGRAPHY N/A 09/10/2016   Procedure: Right/Left Heart Cath and Coronary Angiography;  Surgeon: Kathleene Hazel, MD;  Location: [facility] INVASIVE CV LAB;  Service: Cardiovascular;  Laterality: N/A;   TONSILLECTOMY     Family History  Problem Relation Age of Onset   Other Mother        gun shot   Social History   Socioeconomic History   Marital status: Widowed    Spouse name: Not on file   Number of children: Not on file   Years of education: Not on file   Highest education level: Not on file  Occupational History   Occupation: disabled veteran, [employer]    Employer: RETIRED  Tobacco Use   Smoking status: Former    Packs/day: 2.50    Years: 40.00    Total pack years: 100.00    Types: Cigarettes, Pipe, Cigars     Quit date: 07/28/1998    Years since quitting: 23.4   Smokeless tobacco: Never  Vaping Use   Vaping Use: Never used  Substance and Sexual Activity   Alcohol use: No    Alcohol/week: 0.0 standard drinks of alcohol   Drug use: No   Sexual activity: Not Currently  Other Topics Concern   Not on file  Social History Narrative   Lives in the home with his wife   Sees [place] every 6 month for meds.   Denies asbestos exposure   Social Determinants of Health   Financial Resource Strain: Low Risk  (  01/17/2022)   Overall Financial Resource Strain (CARDIA)    Difficulty of Paying Living Expenses: Not hard at all  Food Insecurity: No Food Insecurity (01/17/2022)   Hunger Vital Sign    Worried About Running Out of Food in the Last Year: Never true    Ran Out of Food in the Last Year: Never true  Transportation Needs: No Transportation Needs (01/17/2022)   PRAPARE - Administrator, Civil Service (Medical): No    Lack of Transportation (Non-Medical): No  Physical Activity: Inactive (01/17/2022)   Exercise Vital Sign    Days of Exercise per Week: 0 days    Minutes of Exercise per Session: 0 min  Stress: No Stress Concern Present (01/17/2022)   [employer] of Occupational Health - Occupational Stress Questionnaire    Feeling of Stress : Not at all  Social Connections: Socially Isolated (01/17/2022)   Social Connection and Isolation Panel [NHANES]    Frequency of Communication with Friends and Family: More than three times a week    Frequency of Social Gatherings with Friends and Family: Once a week    Attends Religious Services: Never    Database administrator or Organizations: No    Attends Banker Meetings: Never    Marital Status: Widowed    Tobacco Counseling Counseling given: Not Answered   Clinical Intake:  Pre-visit preparation completed: Yes  Pain : No/denies pain Pain Score: 0-No pain     BMI - recorded: 26.45 Nutritional Status: BMI 25 -29  Overweight Nutritional Risks: None Diabetes: No  How often do you need to have someone help you when you read instructions, pamphlets, or other written materials from your doctor or pharmacy?: 1 - Never What is the last grade level you completed in school?: HSG  Diabetic? no  Interpreter Needed?: No  Information entered by :: Susie Cassette, LPN.   Activities of Daily Living    01/17/2022   10:47 AM  In your present state of health, do you have any difficulty performing the following activities:  Hearing? 0  Vision? 0  Difficulty concentrating or making decisions? 0  Walking or climbing stairs? 1  Dressing or bathing? 0  Doing errands, shopping? 0  Preparing Food and eating ? N  Using the Toilet? N  In the past six months, have you accidently leaked urine? N  Do you have problems with loss of bowel control? N  Managing your Medications? N  Managing your Finances? N  Housekeeping or managing your Housekeeping? N    Patient Care Team: Myrlene Broker, MD as PCP - General (Internal Medicine) Ernesto Rutherford, MD as Consulting Physician (Ophthalmology) Kalman Shan, MD as Consulting Physician (Pulmonary Disease) System, Provider Not In as Consulting Physician Casimer Lanius, MD as Consulting Physician (Rheumatology) Oretha Milch, MD as Consulting Physician (Pulmonary Disease) Lewayne Bunting, MD as Consulting Physician (Cardiology) Michaela Corner, RN as Case Manager  Indicate any recent Medical Services you may have received from other than [facility] providers in the past year (date may be approximate).     Assessment:   This is a routine wellness examination for [name].  Hearing/Vision screen Hearing Screening - Comments:: Patient denied any hearing difficulty.   No hearing aids.  Vision Screening - Comments:: Patients wears glasses for reading and driving. Eye exam done at: [facility]  Dietary issues and exercise activities discussed: Current  Exercise Habits: The patient does not participate in regular exercise at present, Exercise limited by: respiratory  conditions(s);cardiac condition(s);orthopedic condition(s);Other - see comments (gait disorder/balance)   Goals Addressed             This Visit's Progress    To maintain and stay healthy.        Depression Screen    01/17/2022   10:45 AM 12/13/2021    9:00 AM 10/16/2021    9:45 AM 11/05/2020    9:15 AM 01/25/2019    9:48 AM 08/02/2018    9:51 AM 02/22/2018    9:20 AM  PHQ 2/9 Scores  PHQ - 2 Score 0 0 0 0 0 6 0  PHQ- 9 Score     1 12 3     Fall Risk    01/17/2022   10:37 AM 12/13/2021    9:00 AM 10/16/2021    9:45 AM 09/17/2021    9:45 AM 07/25/2021   10:00 AM  Fall Risk   Falls in the past year? 0 0  1   Comment  denies new/ recent falls x 12 months; continues using cane as needed/ indicated Denies new/ recent falls; continues using cane as needed Denies falls since "last fall" Denies new/ recent falls since last outreach 04/24/21; continues using cane as needed/ indicated  Number falls in past yr: 0 0  0   Injury with Fall? 0 0  0   Comment  N/A- denies falls     Risk for fall due to : Medication side effect;Impaired mobility;History of fall(s) Medication side effect;Impaired mobility;History of fall(s) History of fall(s);Medication side effect;Impaired balance/gait Impaired balance/gait;History of fall(s);Medication side effect Impaired mobility;Other (Comment)  Risk for fall due to: Comment     severe COPD; home O2 use  Follow up Falls prevention discussed Falls prevention discussed Falls prevention discussed Falls prevention discussed Falls prevention discussed    FALL RISK PREVENTION PERTAINING TO THE HOME:  Any stairs in or around the home? No  If so, are there any without handrails? No  Home free of loose throw rugs in walkways, pet beds, electrical cords, etc? Yes  Adequate lighting in your home to reduce risk of falls? Yes   ASSISTIVE DEVICES UTILIZED TO  PREVENT FALLS:  Life alert? Yes  Use of a cane, walker or w/c? Yes  Grab bars in the bathroom? Yes  Shower chair or bench in shower? Yes  Elevated toilet seat or a handicapped toilet? Yes   TIMED UP AND GO:  Was the test performed? No .  Length of time to ambulate 10 feet: n/a sec.   Appearance of gait: Patient not evaluated for gait during this visit.  Cognitive Function:        01/17/2022   10:48 AM  6CIT Screen  What Year? 0 points  What month? 0 points  What time? 0 points  Count back from 20 0 points  Months in reverse 0 points  Repeat phrase 0 points  Total Score 0 points    Immunizations Immunization History  Administered Date(s) Administered   Fluad Quad(high Dose 65+) 04/26/2019, 06/08/2020, 05/01/2021   Influenza Split 04/14/2011, 04/27/2013, 03/28/2014   Influenza Whole 05/28/2009, 04/27/2010, 03/31/2012   Influenza, High Dose Seasonal PF 04/10/2010, 04/11/2011, 04/19/2013, 03/21/2015, 04/05/2016, 05/16/2017, 03/28/2018, 04/08/2018   Influenza,inj,Quad PF,6+ Mos 03/17/2014, 04/04/2016   Influenza-Unspecified 04/30/2006, 04/28/2007, 04/28/2008, 05/29/2009, 04/10/2010, 05/26/2011, 05/07/2012, 05/28/2020, 05/01/2021   PFIZER Comirnaty(Gray Top)Covid-19 Tri-Sucrose Vaccine 11/29/2020, 05/01/2021   PFIZER(Purple Top)SARS-COV-2 Vaccination 08/19/2019, 09/09/2019, 04/08/2020, 12/09/2020   Pneumococcal Conjugate-13 09/20/2013, 03/29/2015   Pneumococcal Polysaccharide-23 05/28/2008, 04/25/2010, 04/19/2013  Pneumococcal-Unspecified 06/26/2006, 04/27/2013   Td 11/23/2009   Tdap 07/17/2011   Zoster, Live 04/28/2009, 11/08/2009    TDAP status: Due, Education has been provided regarding the importance of this vaccine. Advised may receive this vaccine at local pharmacy or Health Dept. Aware to provide a copy of the vaccination record if obtained from local pharmacy or Health Dept. Verbalized acceptance and understanding.  Flu Vaccine status: Up to date  Pneumococcal  vaccine status: Up to date  Covid-19 vaccine status: Completed vaccines  Qualifies for Shingles Vaccine? Yes   Zostavax completed Yes   Shingrix Completed?: No.    Education has been provided regarding the importance of this vaccine. Patient has been advised to call insurance company to determine out of pocket expense if they have not yet received this vaccine. Advised may also receive vaccine at local pharmacy or Health Dept. Verbalized acceptance and understanding.  Screening Tests Health Maintenance  Topic Date Due   Zoster Vaccines- Shingrix (1 of 2) Never done   OPHTHALMOLOGY EXAM  10/06/2018   HEMOGLOBIN A1C  02/18/2021   TETANUS/TDAP  07/16/2021   FOOT EXAM  11/05/2021   INFLUENZA VACCINE  02/25/2022   Pneumonia Vaccine 80+ Years old  Completed   COVID-19 Vaccine  Completed   HPV VACCINES  Aged Out    Health Maintenance  Health Maintenance Due  Topic Date Due   Zoster Vaccines- Shingrix (1 of 2) Never done   OPHTHALMOLOGY EXAM  10/06/2018   HEMOGLOBIN A1C  02/18/2021   TETANUS/TDAP  07/16/2021   FOOT EXAM  11/05/2021    Colorectal cancer screening: No longer required.   Lung Cancer Screening: (Low Dose CT Chest recommended if Age 73-80 years, 30 pack-year currently smoking OR have quit w/in 15years.) does not qualify.   Lung Cancer Screening Referral: no  Additional Screening:  Hepatitis C Screening: does not qualify; Completed no  Vision Screening: Recommended annual ophthalmology exams for early detection of glaucoma and other disorders of the eye. Is the patient up to date with their annual eye exam?  Yes  Who is the provider or what is the name of the office in which the patient attends annual eye exams? [facility] If pt is not established with a provider, would they like to be referred to a provider to establish care? No .   Dental Screening: Recommended annual dental exams for proper oral hygiene  Community Resource Referral / Chronic Care  Management: CRR required this visit?  No   CCM required this visit?  No      Plan:     I have personally reviewed and noted the following in the patient's chart:   Medical and social history Use of alcohol, tobacco or illicit drugs  Current medications and supplements including opioid prescriptions. Patient is not currently taking opioid prescriptions. Functional ability and status Nutritional status Physical activity Advanced directives List of other physicians Hospitalizations, surgeries, and ER visits in previous 12 months Vitals Screenings to include cognitive, depression, and falls Referrals and appointments  In addition, I have reviewed and discussed with patient certain preventive protocols, quality metrics, and best practice recommendations. A written personalized care plan for preventive services as well as general preventive health recommendations were provided to patient.     Mickeal Needy, LPN   [id number]   Nurse Notes:  AWV was not done on 11/22/2021. Patient is cogitatively intact. There were no vitals filed for this visit. There is no height or weight on file to calculate BMI.

## 2022-01-30 ENCOUNTER — Other Ambulatory Visit: Payer: Self-pay | Admitting: Hematology and Oncology

## 2022-01-30 NOTE — Telephone Encounter (Signed)
Pt called to ask if he is supposed to continue prednisone as he has not seen MD sincer 03/2021. Per MD's last note, pt will stay on prednisone indefinitely and he should have followed up with MD in 09/2021. Message sent to scheduling to have pt scheduled for f/u. Rx refilled per MD.

## 2022-02-24 DIAGNOSIS — E118 Type 2 diabetes mellitus with unspecified complications: Secondary | ICD-10-CM | POA: Diagnosis not present

## 2022-02-25 ENCOUNTER — Ambulatory Visit: Payer: Medicare PPO | Admitting: Pulmonary Disease

## 2022-02-25 ENCOUNTER — Encounter: Payer: Self-pay | Admitting: Pulmonary Disease

## 2022-02-25 VITALS — BP 142/76 | HR 61 | Ht 74.0 in | Wt 205.6 lb

## 2022-02-25 DIAGNOSIS — J432 Centrilobular emphysema: Secondary | ICD-10-CM

## 2022-02-25 DIAGNOSIS — Z9989 Dependence on other enabling machines and devices: Secondary | ICD-10-CM

## 2022-02-25 DIAGNOSIS — G4733 Obstructive sleep apnea (adult) (pediatric): Secondary | ICD-10-CM

## 2022-02-25 DIAGNOSIS — J9611 Chronic respiratory failure with hypoxia: Secondary | ICD-10-CM

## 2022-02-25 NOTE — Progress Notes (Signed)
   Subjective:    Patient ID: [name], male    DOB: [birth year], 81 y.o.   MRN: [id number]  HPI  81 yo smoker with gold D COPD, chronic respiratory failure on oxygen, pulmonary nodule -3/ 2017   autologous stem cell transplant for COPD at [facility].    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    CT Imaging-left upper lobe consolidation 12/2016 which resolved with scarring   69-month follow-up visit. [name] is doing well.  He is losing some weight which is intentional. His breathing is stable.  He is compliant with oxygen and his regimen of Stiolto.  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  His last platelet count was 101K in 08/2021  He is compliant with his CPAP and denies any problems with mask or pressure We reviewed his CT scan from March which shows stable nodules   Significant tests/ events reviewed  PSG 04/2014 showed severe OSA, AHI 48/h with nadir desatn 78% correctd by CPAP 12 cm, 3 L O2 blended in , c flex +2 cm, humidity, A medium full face mask was used.  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 Desaturations persisted without resp events on CPAP 12 cm     PFT 10/2015 FEV1 was 36%, ratio 46, FVC 58%, DLCO 26%   CTA chest 09/2021 showed a stable uncomplicated aneurysmal dilatation along the aortic arch.  Interval increase of the abdominal aorta from 33 mm to 36 mm.,  Advanced emphysema.  Stable chronic extensive pleural calcifications, no worrisome pulmonary nodules.  Ill-defined left lower lobe pulmonary nodules unchanged since October 2020.      Review of Systems neg for any significant sore throat, dysphagia, itching, sneezing, nasal congestion or excess/ purulent  secretions, fever, chills, sweats, unintended wt loss, pleuritic or exertional cp, hempoptysis, orthopnea pnd or change in chronic leg swelling. Also denies presyncope, palpitations, heartburn, abdominal pain, nausea, vomiting, diarrhea or change in bowel or urinary habits, dysuria,hematuria, rash, arthralgias, visual complaints, headache, numbness weakness or ataxia.     Objective:   Physical Exam   Gen. Pleasant, obese, in no distress, on oxygen ENT - no lesions, no post nasal drip Neck: No JVD, no thyromegaly, no carotid bruits Lungs: no use of accessory muscles, no dullness to percussion, decreased without rales or rhonchi  Cardiovascular: Rhythm regular, heart sounds  normal, no murmurs or gallops, no peripheral edema Musculoskeletal: No deformities, no cyanosis or clubbing , no tremors        Assessment & Plan:

## 2022-02-25 NOTE — Assessment & Plan Note (Signed)
This is a stable interval for [name].  He is proven to be resilient after his wife's demise.  He has not had recent hospitalizations. He will continue on his regimen of Stiolto and using albuterol as needed. He is maintained on 5 mg of prednisone for ITP

## 2022-02-25 NOTE — Assessment & Plan Note (Signed)
CPAP download was reviewed which shows good control of events on auto settings 10 to 20 cm with average pressure of 11 and maximum pressure of 12.5 cm. We will change to auto settings 10 to 15 cm. He is very compliant and CPAP is only helped improve his daytime somnolence and fatigue

## 2022-02-25 NOTE — Assessment & Plan Note (Signed)
He is compliant with oxygen 24/7. He has completed pulmonary rehab.  I have asked him to be active in the [employer] program

## 2022-02-25 NOTE — Patient Instructions (Addendum)
Continue on Stiolto. X CPAP is working well, change to auto 10 to 15 cm  Call us as needed

## 2022-02-28 ENCOUNTER — Encounter: Payer: Self-pay | Admitting: Internal Medicine

## 2022-03-04 ENCOUNTER — Ambulatory Visit (INDEPENDENT_AMBULATORY_CARE_PROVIDER_SITE_OTHER)
Admission: RE | Admit: 2022-03-04 | Discharge: 2022-03-04 | Disposition: A | Discharge status: Home / Self Care | Payer: Medicare PPO | Source: Ambulatory Visit | Attending: Vascular Surgery | Admitting: Vascular Surgery

## 2022-03-04 ENCOUNTER — Encounter: Payer: Self-pay | Admitting: Vascular Surgery

## 2022-03-04 ENCOUNTER — Ambulatory Visit (HOSPITAL_COMMUNITY)
Admission: RE | Admit: 2022-03-04 | Discharge: 2022-03-04 | Disposition: A | Discharge status: Home / Self Care | Payer: Medicare PPO | Source: Ambulatory Visit | Attending: Vascular Surgery | Admitting: Vascular Surgery

## 2022-03-04 ENCOUNTER — Ambulatory Visit: Payer: Medicare PPO | Admitting: Vascular Surgery

## 2022-03-04 VITALS — BP 159/72 | HR 60 | Temp 97.8°F | Resp 18 | Ht 74.0 in | Wt 200.0 lb

## 2022-03-04 DIAGNOSIS — I724 Aneurysm of artery of lower extremity: Secondary | ICD-10-CM

## 2022-03-04 DIAGNOSIS — L97229 Non-pressure chronic ulcer of left calf with unspecified severity: Secondary | ICD-10-CM | POA: Insufficient documentation

## 2022-03-04 DIAGNOSIS — Z9889 Other specified postprocedural states: Secondary | ICD-10-CM | POA: Insufficient documentation

## 2022-03-04 DIAGNOSIS — I83022 Varicose veins of left lower extremity with ulcer of calf: Secondary | ICD-10-CM | POA: Diagnosis not present

## 2022-03-04 NOTE — Progress Notes (Signed)
Patient name: [name] MRN: [id number] DOB: 1941-06-06 Sex: male  REASON FOR VISIT:.  3 month follow-up after recent intervention on stenosis in left leg bypass  HPI: [name] is a 81 y.o. male with multiple medical comorbidities including COPD (on home oxygen) and CHF who presents for 3 month follow-up after  previous intervention on left leg bypass.  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  Most recently on 10/24/2021 he underwent angioplasty of the distal left common femoral to PT vein bypass with a 4 mm angioplasty balloon given high grade stenosis.  Has no complaints today.  Of note he does have a history of an aortobiiliac bypass for AAA in 2004.  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  He has been delaying intervention on the right popliteal aneurysm given this is not good anatomy for stent graft and he does not feel he can sustain another bypass.   Past Medical History:  Diagnosis Date   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   ablated   Anxiety    Aortic aneurysm ([place]) 09/02/2017   BENIGN PROSTATIC HYPERTROPHY 11/23/2009   Cardiomyopathy ([place]) [id number]   Chronic systolic CHF (congestive heart failure) ([place]) 09/02/2017   COPD (chronic obstructive pulmonary disease) ([place])    CORONARY ARTERY DISEASE 11/23/2009   DECREASED HEARING, 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   DEGENERATIVE JOINT DISEASE 11/23/2009   DEPRESSION 11/23/2009   FATIGUE 11/23/2009   GAIT DISTURBANCE 12/10/2009   HEMOPTYSIS UNSPECIFIED 05/07/2010   High cholesterol    HYPERTENSION 07/30/2009   HYPOTHYROIDISM 07/30/2009   Ischemic cardiomyopathy 09/02/2017   LUMBAR RADICULOPATHY, 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   On home oxygen therapy    "2-3L; 24/7" (09/10/2016)   OSA on CPAP    Pneumonia    PTSD (post-traumatic  stress disorder) 03/10/2012   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   RA (rheumatoid arthritis) ([place]) 06/11/2011   "qwhere" (09/10/2016)   RESPIRATORY FAILURE, CHRONIC 07/31/2009   Scleritis of both eyes 03/17/2014   Thrombocytopenia ([place])    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   Type II diabetes mellitus ([place])     Past Surgical History:  Procedure Laterality Date   ABDOMINAL AORTIC ANEURYSM REPAIR  07/2002   Archie Endo 12/10/2010   ABDOMINAL AORTOGRAM W/LOWER EXTREMITY N/A 08/16/2020   Procedure: ABDOMINAL AORTOGRAM W/LOWER EXTREMITY;  Surgeon: [name], MD;  Location: [facility];  Service: Cardiovascular;  Laterality: N/A;   ABDOMINAL AORTOGRAM W/LOWER EXTREMITY N/A 10/24/2021   Procedure: ABDOMINAL AORTOGRAM W/LOWER EXTREMITY;  Surgeon: [name], MD;  Location: [facility];  Service: Cardiovascular;  Laterality: N/A;   ABDOMINAL EXPLORATION SURGERY  02/2004   w/LOA/notes 12/10/2010; small bowel obstruction repair with adhesiolysis    BACK SURGERY     CARDIAC CATHETERIZATION     2 heart caths in the past.  One in 2000s showed one ulcerated plaque  Rx medically; Second at [facility] /notes 09/05/2016   CATARACT EXTRACTION W/ INTRAOCULAR LENS  IMPLANT, BILATERAL Bilateral 2000s   COLECTOMY     hx of remote ileum resection due to bleeding   COLONOSCOPY WITH PROPOFOL N/A 01/22/2018   Procedure: COLONOSCOPY WITH PROPOFOL;  Surgeon: Gatha Mayer, MD;  Location: [facility];  Service: Endoscopy;  Laterality: N/A;   CORONARY ANGIOPLASTY WITH STENT PLACEMENT  09/10/2016  CORONARY STENT INTERVENTION N/A 09/10/2016   Procedure: Coronary Stent Intervention;  Surgeon: Kathleene Hazel, MD;  Location: [facility] INVASIVE CV LAB;  Service: Cardiovascular;  Laterality: N/A;  Distal RCA 4.0x16 Synergy   ESOPHAGOGASTRODUODENOSCOPY (EGD) WITH PROPOFOL N/A 01/22/2018   Procedure: ESOPHAGOGASTRODUODENOSCOPY (EGD) WITH PROPOFOL;  Surgeon: Iva Boop, MD;  Location: [facility];  Service: Endoscopy;   Laterality: N/A;   EYE SURGERY     FALSE ANEURYSM REPAIR Left 08/22/2020   Procedure: EXCLUSION OF LEFT POPLITEAL ARTERY ANEURYSM;  Surgeon: Cephus Shelling, MD;  Location: [facility] OR;  Service: Vascular;  Laterality: Left;   FEMORAL EMBOLOECTOMY Left 07/2000   with left leg ischemia; Dr. Hart Rochester, vascular   FEMORAL-POPLITEAL BYPASS GRAFT Bilateral 08/22/2020   Procedure: LEFT FEMORAL-POSTERIOR TIBIAL ARTERY BYPASS GRAFT USING RIGHT GREATER NONREVERSED SAPHENOUS VEIN GRAFT;  Surgeon: Cephus Shelling, MD;  Location: [facility] OR;  Service: Vascular;  Laterality: Bilateral;   GANGLION CYST EXCISION Right    "wrist"; Dr. Teressa Senter   HOT HEMOSTASIS N/A 01/22/2018   Procedure: HOT HEMOSTASIS (ARGON PLASMA COAGULATION/BICAP);  Surgeon: Iva Boop, MD;  Location: [facility];  Service: Endoscopy;  Laterality: N/A;   LOOP RECORDER INSERTION N/A 04/25/2019   Procedure: LOOP RECORDER INSERTION;  Surgeon: Duke Salvia, MD;  Location: [facility]VAS[facility] CV LAB;  Service: Cardiovascular;  Laterality: N/A;   LOOP RECORDER REMOVAL N/A 07/01/2019   Procedure: LOOP RECORDER REMOVAL;  Surgeon: Duke Salvia, MD;  Location: [facility] INVASIVE CV LAB;  Service: Cardiovascular;  Laterality: N/A;   LUMBAR LAMINECTOMY  1972   Dr. Fannie Knee   PACEMAKER IMPLANT N/A 07/01/2019   Procedure: PACEMAKER IMPLANT;  Surgeon: Duke Salvia, MD;  Location: [facility]VAS[facility] CV LAB;  Service: Cardiovascular;  Laterality: N/A;   PERIPHERAL VASCULAR BALLOON ANGIOPLASTY Left 10/24/2021   Procedure: PERIPHERAL VASCULAR BALLOON ANGIOPLASTY;  Surgeon: Cephus Shelling, MD;  Location: [facility] INVASIVE [facility] LAB;  Service: Cardiovascular;  Laterality: Left;   RIGHT/LEFT HEART CATH AND CORONARY ANGIOGRAPHY N/A 09/10/2016   Procedure: Right/Left Heart Cath and Coronary Angiography;  Surgeon: Kathleene Hazel, MD;  Location: [facility] INVASIVE CV LAB;  Service: Cardiovascular;  Laterality: N/A;   TONSILLECTOMY      Family History  Problem Relation Age of Onset    Other Mother        gun shot    SOCIAL HISTORY: Social History   Tobacco Use   Smoking status: Former    Packs/day: 2.50    Years: 40.00    Total pack years: 100.00    Types: Cigarettes, Pipe, Cigars    Quit date: 07/28/1998    Years since quitting: 23.6   Smokeless tobacco: Never  Substance Use Topics   Alcohol use: No    Alcohol/week: 0.0 standard drinks of alcohol    No Known Allergies  Current Outpatient Medications  Medication Sig Dispense Refill   albuterol (PROVENTIL) (2.5 MG/3ML) 0.083% nebulizer solution Take 3 mLs (2.5 mg total) by nebulization every 6 (six) hours as needed for wheezing or shortness of breath. 120 mL 12   albuterol (VENTOLIN HFA) 108 (90 Base) MCG/ACT inhaler Inhale 2 puffs into the lungs every 6 (six) hours as needed for wheezing or shortness of breath. 8 g 6   atorvastatin (LIPITOR) 40 MG tablet Take 1 tablet (40 mg total) by mouth daily. 90 tablet 3   Carboxymethylcellul-Glycerin (LUBRICATING EYE DROPS OP) Place 1 drop into both eyes 4 (four) times daily as needed (dry eyes).  Cholecalciferol (VITAMIN D3) 2000 units capsule Take 2,000 Units by mouth daily.      clopidogrel (PLAVIX) 75 MG tablet Take 1 tablet (75 mg total) by mouth daily. 30 tablet 11   Continuous Blood Gluc Transmit (DEXCOM G6 TRANSMITTER) MISC by Does not apply route.     empagliflozin (JARDIANCE) 10 MG TABS tablet Take 5 mg by mouth daily. Filled at [place].     fluticasone (FLONASE) 50 MCG/ACT nasal spray Place 1 spray into both nostrils daily. 18.2 mL 2   gabapentin (NEURONTIN) 100 MG capsule Take 100 mg by mouth at bedtime.     insulin glargine (LANTUS) 100 UNIT/ML injection Inject 40 Units into the skin at bedtime.     levothyroxine (SYNTHROID) 175 MCG tablet Take 175 mcg by mouth daily before breakfast.     losartan (COZAAR) 50 MG tablet Take 1 tablet (50 mg total) by mouth daily. 90 tablet 3   metFORMIN (GLUCOPHAGE) 500 MG tablet Take 500-1,000 mg by mouth 2 (two) times daily  with a meal. Take 1 tablet (500 mg) by mouth in the morning & take 2 tablets (1000 mg) by mouth in the evening.     Multiple Vitamins-Minerals (MEGA MULTIVITAMIN FOR MEN PO) Take 1 tablet by mouth daily.     OXYGEN Inhale 2 L into the lungs continuous.     predniSONE (DELTASONE) 5 MG tablet TAKE 1 TABLET(5 MG) BY MOUTH DAILY WITH BREAKFAST 90 tablet 3   Semaglutide (OZEMPIC, 1 MG/DOSE, [place]) Inject 1 mg into the skin every Friday.     tamsulosin (FLOMAX) 0.4 MG CAPS capsule Take 1 capsule (0.4 mg total) by mouth at bedtime. 30 capsule 0   Tiotropium Bromide-Olodaterol (STIOLTO RESPIMAT) 2.5-2.5 MCG/ACT AERS Inhale 2 puffs into the lungs daily. 4 g 0   No current facility-administered medications for this visit.    REVIEW OF SYSTEMS:  [X]  denotes positive finding, [ ]  denotes negative finding Cardiac  Comments:  Chest pain or chest pressure:    Shortness of breath upon exertion:    Short of breath when lying flat:    Irregular heart rhythm:        Vascular    Pain in calf, thigh, or hip brought on by ambulation:    Pain in feet at night that wakes you up from your sleep:     Blood clot in your veins:    Leg swelling:         Pulmonary    Oxygen at home:    Productive cough:     Wheezing:         Neurologic    Sudden weakness in arms or legs:     Sudden numbness in arms or legs:     Sudden onset of difficulty speaking or slurred speech:    Temporary loss of vision in one eye:     Problems with dizziness:         Gastrointestinal    Blood in stool:     Vomited blood:         Genitourinary    Burning when urinating:     Blood in urine:        Psychiatric    Major depression:         Hematologic    Bleeding problems:    Problems with blood clotting too easily:        Skin    Rashes or ulcers:        Constitutional    Fever  or chills:      PHYSICAL EXAM: Vitals:   03/04/22 1410  BP: (!) 159/72  Pulse: 60  Resp: 18  Temp: 97.8 F (36.6 C)  TempSrc: Temporal   SpO2: 98%  Weight: 200 lb (90.7 kg)  Height: 6\' 2"  (1.88 m)    GENERAL: The patient is a well-nourished male, in no acute distress. The vital signs are documented above. CARDIAC: There is a regular rate and rhythm.  VASCULAR:  Bilateral femoral pulses palpable Neuro: Grossly neurologically intact  DATA:   ABIs on the left have dropped from 1.0 to 0.63  Left lower extremity arterial duplex shows patent bypass graft with high-grade stenosis at the distal anastomosis and outflow >70% with a velocity 626.  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  He developed a significant stenosis in the distal bypass that was treated with balloon angioplasty on 10/24/21 with good results.  This looked much better on last follow-up but unfortunately on 13-month interval surveillance today he has evidence of a recurrent high-grade stenosis at the distal anastomosis.  I have recommended another lower extremity arteriogram with intervention likely from a brachial approach to maintain patency of the bypass.  We had significant difficulty getting over his aortic bifurcation last time given a previous aortobiiliac bypass for abdominal aortic aneurysm.  We will schedule for next Thursday on 03/13/2022.  Discussed he continue plavix from my standpoint.  03/15/2022, MD [facility] Office: [phone]

## 2022-03-05 ENCOUNTER — Other Ambulatory Visit: Payer: Self-pay

## 2022-03-05 ENCOUNTER — Ambulatory Visit: Payer: Medicare PPO | Admitting: *Deleted

## 2022-03-05 ENCOUNTER — Telehealth: Payer: Self-pay

## 2022-03-05 DIAGNOSIS — J449 Chronic obstructive pulmonary disease, unspecified: Secondary | ICD-10-CM

## 2022-03-05 DIAGNOSIS — E118 Type 2 diabetes mellitus with unspecified complications: Secondary | ICD-10-CM

## 2022-03-05 DIAGNOSIS — T82858A Stenosis of vascular prosthetic devices, implants and grafts, initial encounter: Secondary | ICD-10-CM

## 2022-03-05 DIAGNOSIS — Z9889 Other specified postprocedural states: Secondary | ICD-10-CM

## 2022-03-05 NOTE — Chronic Care Management (AMB) (Signed)
Care Management    RN Visit Note  03/05/2022 Name: [name] MRN: [id number] DOB: [birth year]  Subjective: [name] is a 81 y.o. year old male who is a primary care patient of [name], MD. The care management team was consulted for assistance with disease management and care coordination needs.    Engaged with patient by telephone for follow up visit/ RN CM case closure in response to provider referral for case management and/or care coordination services.   Consent to Services:   [name] was given information about Care Management services 04/05/21 including:  Care Management services includes personalized support from designated clinical staff supervised by his physician, including individualized plan of care and coordination with other care providers 24/7 contact phone numbers for assistance for urgent and routine care needs. The patient may stop case management services at any time by phone call to the office staff.  Patient agreed to services and consent obtained.   Assessment: Review of patient past medical history, allergies, medications, health status, including review of consultants reports, laboratory and other test data, was performed as part of comprehensive evaluation and provision of chronic care management services.   SDOH (Social Determinants of Health) assessments and interventions performed:  SDOH Interventions    Flowsheet Row Most Recent Value  SDOH Interventions   Food Insecurity Interventions Intervention Not Indicated  Transportation Interventions Intervention Not Indicated  [continues to drive self]     Care Plan  No Known Allergies  Outpatient Encounter Medications as of 03/05/2022  Medication Sig   albuterol (PROVENTIL) (2.5 MG/3ML) 0.083% nebulizer solution Take 3 mLs (2.5 mg total) by nebulization every 6 (six) hours as needed for wheezing or shortness of breath.   albuterol (VENTOLIN HFA) 108 (90 Base) MCG/ACT inhaler  Inhale 2 puffs into the lungs every 6 (six) hours as needed for wheezing or shortness of breath.   atorvastatin (LIPITOR) 40 MG tablet Take 1 tablet (40 mg total) by mouth daily.   Carboxymethylcellul-Glycerin (LUBRICATING EYE DROPS OP) Place 1 drop into both eyes 4 (four) times daily as needed (dry eyes).    Cholecalciferol (VITAMIN D3) 2000 units capsule Take 2,000 Units by mouth daily.    clopidogrel (PLAVIX) 75 MG tablet Take 1 tablet (75 mg total) by mouth daily.   Continuous Blood Gluc Transmit (DEXCOM G6 TRANSMITTER) MISC by Does not apply route.   empagliflozin (JARDIANCE) 10 MG TABS tablet Take 5 mg by mouth daily. Filled at [place].   fluticasone (FLONASE) 50 MCG/ACT nasal spray Place 1 spray into both nostrils daily.   gabapentin (NEURONTIN) 100 MG capsule Take 100 mg by mouth at bedtime.   insulin glargine (LANTUS) 100 UNIT/ML injection Inject 40 Units into the skin at bedtime.   levothyroxine (SYNTHROID) 175 MCG tablet Take 175 mcg by mouth daily before breakfast.   losartan (COZAAR) 50 MG tablet Take 1 tablet (50 mg total) by mouth daily.   metFORMIN (GLUCOPHAGE) 500 MG tablet Take 500-1,000 mg by mouth 2 (two) times daily with a meal. Take 1 tablet (500 mg) by mouth in the morning & take 2 tablets (1000 mg) by mouth in the evening.   Multiple Vitamins-Minerals (MEGA MULTIVITAMIN FOR MEN PO) Take 1 tablet by mouth daily.   OXYGEN Inhale 2 L into the lungs continuous.   predniSONE (DELTASONE) 5 MG tablet TAKE 1 TABLET(5 MG) BY MOUTH DAILY WITH BREAKFAST   Semaglutide (OZEMPIC, 1 MG/DOSE, [place]) Inject 1 mg into the skin every Friday.  tamsulosin (FLOMAX) 0.4 MG CAPS capsule Take 1 capsule (0.4 mg total) by mouth at bedtime.   Tiotropium Bromide-Olodaterol (STIOLTO RESPIMAT) 2.5-2.5 MCG/ACT AERS Inhale 2 puffs into the lungs daily.   No facility-administered encounter medications on file as of 03/05/2022.   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[place]) 12/18/2020   Obesity 12/11/2020   Osteoarthritis 12/11/2020   Other long term (current) drug therapy 12/11/2020   Thrombocytopenia ([place]) 12/11/2020   Encounter for general adult medical examination with abnormal findings 11/05/2020   Popliteal aneurysm ([place]) 08/22/2020   Popliteal artery aneurysm ([place]) 08/14/2020   Claudication ([place]) 07/03/2020   Physical deconditioning 06/08/2020   Hemoptysis 02/16/2020   Syncope 10/04/2019   LBBB (left bundle branch block) 10/04/2019   Pacemaker 10/04/2019   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   Lung nodules 03/25/2019   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   Symptomatic anemia 01/20/2018   BENIGN PROSTATIC HYPERTROPHY 12/2017   Chronic respiratory failure with hypoxia ([place]) 12/01/2017   PAF (paroxysmal atrial fibrillation) ([place])    Ischemic cardiomyopathy 08/65/7846   Chronic systolic CHF (congestive heart failure) ([place]) 09/02/2017   Aortic aneurysm ([place]) 09/02/2017   Diabetes mellitus with complication, with long-term current use of insulin ([place]) 09/01/2017   Anxiety and depression 09/01/2017   COPD (chronic obstructive pulmonary disease) ([place]) 09/01/2017   CORONARY ARTERY DISEASE 08/28/2016   Myogenic ptosis of right eyelid 07/11/2016   Open angle with borderline findings, low risk, bilateral 07/11/2016   Bilateral dry eyes 04/14/2014   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/14/2014   Hyperlipidemia associated with type 2 diabetes mellitus (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   Rheumatoid  arthritis ([place]) 09/09/2011   Scleromalacia 09/09/2011   COPD with acute exacerbation ([place]) 08/28/2011   OSA on CPAP 06/10/2011   GAIT DISTURBANCE 12/10/2009   Coronary atherosclerosis 11/23/2009   Hypothyroidism 07/30/2009   Essential hypertension 07/30/2009   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 addressed/monitored: COPD and DMII  Care Plan : RN Care Manager Plan of Care  Updates made by Knox Royalty, RN since 03/05/2022 12:00 AM     Problem: Chronic Disease Management Needs   Priority: Medium     Long-Range Goal: Ongoing adherence to established plan of care for long term chronic disease management   Start Date: 04/24/2021  Expected End Date: 04/24/2022  Priority: Medium  Note:   Current Barriers:  Chronic Disease Management support and education needs related to COPD and DMII Fragile state of health, multiple progressing chronic health conditions- on continuous home O2 at 2-3 L/min October 24, 2021 as outpatient for planned/ elective (L) fem pop stenting: discharged home to self care  RNCM Clinical Goal(s):  Patient will demonstrate ongoing health management independence with COPD, DMII, multiple chronic health issues  through collaboration with RN Care manager, provider, and care team.   Interventions: 1:1 collaboration with primary care provider regarding development and update of comprehensive plan of care as evidenced by provider attestation and co-signature Inter-disciplinary care team collaboration (see longitudinal plan of care) Evaluation of  current treatment plan related to  self management and patient's adherence to plan as established by provider Review of patient status, including review of consultants reports, relevant laboratory and other test results, and medications completed SDOH updated: no new/ unmet concerns identified Pain assessment updated: reports ongoing intermittent/ episodic (L) foot (sole) pain, that "happens at night;" reports is spontaneously relieved without specific  intervention- states he discussed this pain with vascular provider at office visit 12/03/21- no changes or advice provided at that time; denies pain today Patient reports had recent follow up office visit with vascular provider and has been scheduled for outpatient revision of (L) LE "stenting" on 03/13/22 Falls assessment updated: continues to deny new/ recent falls x 12 months- continues using cane as/ if indicated;  positive reinforcement provided with encouragement to continue efforts at fall prevention; previously provided education around fall risks/ prevention reinforced Reviewed recent provider office visits: verbalizes good understanding of post-office visit instructions/ plans of care 02/25/22- pulmonary provider; confirms no changes made; continues using home O2 and CPA Medications discussed: reports continues to independently self-manage and denies current concerns/ issues/ questions around medications; endorses adherence to taking all medications as prescribed Reviewed upcoming scheduled provider appointments: 03/13/22- outpatient surgery as above; patient confirms is aware of all and has plans to attend as scheduled Discussed plans with patient for ongoing care management follow up- patient denies current care coordination/ care management needs and is agreeable to RN CM case closure today; verbalizes understanding to contact PCP or other care providers for any needs that arise in the future, and confirms he has contact information for all care providers     COPD: (Status: 03/05/22: Goal Met.) Long term Goal Advised patient to track and manage COPD triggers Advised patient to self assesses COPD action plan zone and make appointment with provider if in the yellow zone for 48 hours without improvement Advised patient to engage in light exercise as tolerated 3-5 days a week to aid in the the management of COPD Discussed the importance of adequate rest and management of fatigue with COPD Assessed  social determinant of health barriers Discussed current clinical condition: reports at baseline with breathing, "no concerns or problems;" continues using maintenance inhaler as prescribed; rarely using rescue inhaler, continues using home O2 and CPAP as prescribed; continues to report stays "active as possible;" tells me today he has been doing yard work, and paces himself, especially around hot weather conditions; positive reinforcement provided with encouragement to continue efforts;  benefits of maintaining activity and exercise routine over time discussed, encouraged patient to pace self and not over-do activity Reinforced previously provided education action plan for COPD flares: patient has good baseline understanding of same; managing COPD well at home on day-to-day basis Confirmed patient follows low salt diet- positive reinforcement provided  Diabetes:  (Status: 03/05/22: Goal Met.) Long Term Goal  Provided education to patient about basic DM disease process; Reviewed prescribed diet with patient low sugar/ carbohydrate, low salt, heart healthy; Counseled on importance of regular laboratory monitoring as prescribed;        Confirmed patient continues using CGM- not having any problems; still reports "loves it;" tells me that his insurance company has contacted him to upgrade from [employer] G-6 to [employer]; he sent message to PCP 02/28/22 and would like follow up- states insurance company needs approval/ verification from PCP prior to providing upgraded supplies; he confirms he currently has plenty of supplies for Dexcom G-6; however, he likes to "stay on top" of things  and would like follow up from PCP- will send PCP and CMA message requesting follow up Discussed/ reviewed general ranges of blood sugars from CGM: he does not report specific values today, but continues to report "my blood sugars are much better, in general"     Plan:  No further follow up required: patient denies current care  coordination/ care management needs and is agreeable to RN CM case closure today; case closure accordingly   Oneta Rack, RN, BSN, [place] [phone]: direct office

## 2022-03-05 NOTE — Telephone Encounter (Signed)
Pt has called stating he is wanting a call back form [facility] as he has surgery sched for the day of his CCM tele visit on 03/13/2022. Pt states he does not want to miss his apptmnt like he did last time and is wanting a call back ASAP to resched.

## 2022-03-13 ENCOUNTER — Encounter (HOSPITAL_COMMUNITY)
Admission: RE | Disposition: A | Discharge status: Home / Self Care | Payer: Self-pay | Source: Home / Self Care | Attending: Vascular Surgery

## 2022-03-13 ENCOUNTER — Telehealth: Payer: Self-pay | Admitting: Vascular Surgery

## 2022-03-13 ENCOUNTER — Ambulatory Visit (HOSPITAL_COMMUNITY)
Admission: RE | Admit: 2022-03-13 | Discharge: 2022-03-13 | Disposition: A | Discharge status: Home / Self Care | Payer: Medicare PPO | Attending: Vascular Surgery | Admitting: Vascular Surgery

## 2022-03-13 ENCOUNTER — Telehealth: Payer: Medicare PPO

## 2022-03-13 ENCOUNTER — Other Ambulatory Visit: Payer: Self-pay

## 2022-03-13 DIAGNOSIS — Z7902 Long term (current) use of antithrombotics/antiplatelets: Secondary | ICD-10-CM | POA: Insufficient documentation

## 2022-03-13 DIAGNOSIS — Y832 Surgical operation with anastomosis, bypass or graft as the cause of abnormal reaction of the patient, or of later complication, without mention of misadventure at the time of the procedure: Secondary | ICD-10-CM | POA: Insufficient documentation

## 2022-03-13 DIAGNOSIS — T82858A Stenosis of vascular prosthetic devices, implants and grafts, initial encounter: Secondary | ICD-10-CM

## 2022-03-13 DIAGNOSIS — Z9889 Other specified postprocedural states: Secondary | ICD-10-CM

## 2022-03-13 DIAGNOSIS — J449 Chronic obstructive pulmonary disease, unspecified: Secondary | ICD-10-CM | POA: Insufficient documentation

## 2022-03-13 DIAGNOSIS — I509 Heart failure, unspecified: Secondary | ICD-10-CM | POA: Diagnosis not present

## 2022-03-13 DIAGNOSIS — T85858A Stenosis due to other internal prosthetic devices, implants and grafts, initial encounter: Secondary | ICD-10-CM | POA: Diagnosis not present

## 2022-03-13 DIAGNOSIS — Z9981 Dependence on supplemental oxygen: Secondary | ICD-10-CM | POA: Insufficient documentation

## 2022-03-13 DIAGNOSIS — I724 Aneurysm of artery of lower extremity: Secondary | ICD-10-CM | POA: Insufficient documentation

## 2022-03-13 HISTORY — PX: ABDOMINAL AORTOGRAM W/LOWER EXTREMITY: CATH118223

## 2022-03-13 LAB — POCT I-STAT, CHEM 8
BUN: 30 mg/dL — ABNORMAL HIGH (ref 8–23)
Calcium, Ion: 1.29 mmol/L (ref 1.15–1.40)
Chloride: 104 mmol/L (ref 98–111)
Creatinine, Ser: 1.2 mg/dL (ref 0.61–1.24)
Glucose, Bld: 89 mg/dL (ref 70–99)
HCT: 39 % (ref 39.0–52.0)
Hemoglobin: 13.3 g/dL (ref 13.0–17.0)
Potassium: 4.4 mmol/L (ref 3.5–5.1)
Sodium: 142 mmol/L (ref 135–145)
TCO2: 29 mmol/L (ref 22–32)

## 2022-03-13 LAB — POCT ACTIVATED CLOTTING TIME: Activated Clotting Time: 227 seconds

## 2022-03-13 SURGERY — ABDOMINAL AORTOGRAM W/LOWER EXTREMITY
Anesthesia: LOCAL

## 2022-03-13 MED ORDER — FENTANYL CITRATE (PF) 100 MCG/2ML IJ SOLN
INTRAMUSCULAR | Status: AC
  Filled 2022-03-13: qty 2

## 2022-03-13 MED ORDER — MIDAZOLAM HCL 2 MG/2ML IJ SOLN
INTRAMUSCULAR | Status: DC | PRN
  Administered 2022-03-13: 1 mg via INTRAVENOUS

## 2022-03-13 MED ORDER — HEPARIN (PORCINE) IN NACL 1000-0.9 UT/500ML-% IV SOLN
INTRAVENOUS | Status: AC
  Filled 2022-03-13: qty 1000

## 2022-03-13 MED ORDER — SODIUM CHLORIDE 0.9 % IV SOLN: INTRAVENOUS | Status: DC

## 2022-03-13 MED ORDER — FENTANYL CITRATE (PF) 100 MCG/2ML IJ SOLN
INTRAMUSCULAR | Status: DC | PRN
  Administered 2022-03-13: 25 ug via INTRAVENOUS

## 2022-03-13 MED ORDER — HEPARIN SODIUM (PORCINE) 1000 UNIT/ML IJ SOLN
INTRAMUSCULAR | Status: DC | PRN
  Administered 2022-03-13: 2000 [IU] via INTRAVENOUS
  Administered 2022-03-13: 7000 [IU] via INTRAVENOUS

## 2022-03-13 MED ORDER — HEPARIN (PORCINE) IN NACL 1000-0.9 UT/500ML-% IV SOLN
INTRAVENOUS | Status: DC | PRN
  Administered 2022-03-13 (×2): 500 mL

## 2022-03-13 MED ORDER — IODIXANOL 320 MG/ML IV SOLN
INTRAVENOUS | Status: DC | PRN
  Administered 2022-03-13: 120 mL via INTRA_ARTERIAL

## 2022-03-13 MED ORDER — LIDOCAINE HCL (PF) 1 % IJ SOLN
INTRAMUSCULAR | Status: AC
  Filled 2022-03-13: qty 30

## 2022-03-13 MED ORDER — LIDOCAINE HCL (PF) 1 % IJ SOLN
INTRAMUSCULAR | Status: DC | PRN
  Administered 2022-03-13: 5 mL via INTRADERMAL

## 2022-03-13 MED ORDER — MIDAZOLAM HCL 2 MG/2ML IJ SOLN
INTRAMUSCULAR | Status: AC
  Filled 2022-03-13: qty 2

## 2022-03-13 MED ORDER — NITROGLYCERIN 1 MG/10 ML FOR IR/CATH LAB
INTRA_ARTERIAL | Status: AC
  Filled 2022-03-13: qty 10

## 2022-03-13 MED ORDER — NITROGLYCERIN 1 MG/10 ML FOR IR/CATH LAB
INTRA_ARTERIAL | Status: DC | PRN
  Administered 2022-03-13 (×2): 200 ug via INTRA_ARTERIAL

## 2022-03-13 SURGICAL SUPPLY — 16 items
CATH ANGIO 5F BER2 100CM (CATHETERS) IMPLANT
CATH VIPER ANG 150 (CATHETERS) IMPLANT
DEVICE RAD COMP TR BAND LRG (VASCULAR PRODUCTS) IMPLANT
DEVICE TORQUE .025-.038 (MISCELLANEOUS) IMPLANT
GLIDESHEATH SLEND SS 6F .021 (SHEATH) IMPLANT
GLIDEWIRE ADV .035X260CM (WIRE) IMPLANT
KIT MICROPUNCTURE NIT STIFF (SHEATH) IMPLANT
KIT PV (KITS) ×1 IMPLANT
LUBRICANT VIPERSLIDE CORONARY (MISCELLANEOUS) IMPLANT
SHEATH PINNACLE 6F 10CM (SHEATH) IMPLANT
SHEATH SLENDER DESTINATION 6FR (SHEATH) IMPLANT
STOPCOCK MORSE 400PSI 3WAY (MISCELLANEOUS) IMPLANT
SYR MEDRAD MARK V 150ML (SYRINGE) IMPLANT
TRANSDUCER W/STOPCOCK (MISCELLANEOUS) ×1 IMPLANT
TRAY PV CATH (CUSTOM PROCEDURE TRAY) ×1 IMPLANT
TUBING CIL FLEX 10 FLL-RA (TUBING) IMPLANT

## 2022-03-13 NOTE — Progress Notes (Addendum)
TR BAND REMOVAL  LOCATION:    left radial  DEFLATED PER PROTOCOL:    Yes.    TIME BAND OFF / DRESSING APPLIED:    1245 gauze dressing applied   SITE UPON ARRIVAL:    Level 0  SITE AFTER BAND REMOVAL:    Level 0  CIRCULATION SENSATION AND MOVEMENT:    Within Normal Limits   Yes.    COMMENTS:   No issues

## 2022-03-13 NOTE — Op Note (Signed)
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  Ultrasound-guided access left radial artery 2.  Left lower extremity arteriogram with selection of third order branches from a transradial approach 3.  60 minutes of monitored moderate conscious sedation time  Indications: Patient is a 81 year old male that underwent a left common femoral to posterior tibial artery bypass with nonreversed contralateral great saphenous vein on 08/22/2020.  He had evidence of a high-grade stenosis in the distal bypass that has previously undergone intervention.  He presents today for left lower extremity arteriogram with plans for intervention on the left leg bypass after risks benefits discussed.  Findings:   Ultrasound-guided access left radial artery given previous aortobiiliac bypass making coming from the contralateral groin difficult.  Left lower extremity arteriogram ultimately revealed the left common femoral to posterior tibial bypass is occluded with no flow.  I did get into the bypass and there was acute thrombus.  Unfortunately given this is transradial access we do not have a thrombolytics catheter long enough or any other thrombectomy device long enough to intervene.  He has had a silent occlusion with no symptoms in the left leg.  I did get a new left lower extremity arteriogram for new target mapping if he has worsening symptoms in the future.   Procedure:  The patient was identified in the holding area and taken to room 8.  The patient was then placed supine on the table and prepped and draped in the usual sterile fashion.  A time out was called.  He received Versed and fentanyl for moderate sedation.  Vital signs were monitored including heart rate,  blood pressure, oxygenation and respiratory rate.  I was present for all of moderate sedation.  We used ultrasound to evaluate the left radial artery at the wrist, it was patent an image was saved.  The left radial artery was accessed with a micro access needle and placed a microwire and then a short 6 French slender sheath.  I then used a Glidewire advantage with a BER 2 catheter to come retrograde up across the subclavian artery and down the descending thoracic aorta.  Patient was given 100 units/kg IV heparin.  I used a long Terumo 119 cm 6 French slender sheath from transradial access all the way down to the left common iliac artery (left limb of bypass).  I then exchanged for a long Glidewire with a long CSI catheter and we got hand injections of the left lower extremity.  Unfortunately this showed no flow in the left leg bypass.  I was able to cannulate the bypass and this had acute thrombus in it.  The common femoral profunda SFA are all patent.  He has a known left popliteal artery occlusion.  Distally he reconstitutes posterior tibial with a high takeoff that is his dominant runoff.  Peroneal has a high-grade stenosis distally.  The anterior tibial had occlusion in the mid calf.  We did not have any options for intervention given we were transradial and could not find a lytics catheter long enough or any other thrombectomy device long enough.  Wires and catheters were removed.  A short 6 and sheath was placed in left radial artery.  TR band was placed after the sheath was removed.  Plan: Left common femoral to PT  bypass is occluded.  He is having no symptoms and this appears to be a silent occlusion.  I will follow-up with him in the office in a month to see how he is doing.  Did get new target mapping today and it appears his posterior tibial is still the best target if he needs a future re-do bypass.  Cephus Shelling, MD [facility] Office:  [phone]  Cephus Shelling

## 2022-03-13 NOTE — Telephone Encounter (Signed)
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  Ultrasound-guided access left radial artery 2.  Left lower extremity arteriogram with selection of third order branches from a transradial approach 3.  60 minutes of monitored moderate conscious sedation time  #Can you arrange follow-up with me in one month?  No studies.  Thanks,  Thayer Ohm

## 2022-03-13 NOTE — H&P (Signed)
History and Physical Interval Note:  03/13/2022 8:26 AM  [name]  has presented today for surgery, with the diagnosis of left leg bypass graft stenosis.  The various methods of treatment have been discussed with the patient and family. After consideration of risks, benefits and other options for treatment, the patient has consented to  Procedure(s): ABDOMINAL AORTOGRAM W/LOWER EXTREMITY (N/A) as a surgical intervention.  The patient's history has been reviewed, patient examined, no change in status, stable for surgery.  I have reviewed the patient's chart and labs.  Questions were answered to the patient's satisfaction.     [name]  Patient name: [name]         MRN: [id number]        DOB: [birth year]          Sex: male   REASON FOR VISIT:.  3 month follow-up after recent intervention on stenosis in left leg bypass   HPI: [name] is a 81 y.o. male with multiple medical comorbidities including COPD (on home oxygen) and CHF who presents for 3 month follow-up after  previous intervention on left leg bypass.  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  Most recently on 10/24/2021 he underwent angioplasty of the distal left common femoral to PT vein bypass with a 4 mm angioplasty balloon given high grade stenosis.  Has no complaints today.   Of note he does have a history of an aortobiiliac bypass for AAA in 2004.  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  He has been delaying intervention on the right popliteal aneurysm given this is not good anatomy for stent graft and he does not feel he can sustain another bypass.         Past Medical History:  Diagnosis Date   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    ablated   Anxiety     Aortic  aneurysm ([place]) 09/02/2017   BENIGN PROSTATIC HYPERTROPHY 11/23/2009   Cardiomyopathy ([place]) [id number]   Chronic systolic CHF (congestive heart failure) ([place]) 09/02/2017   COPD (chronic obstructive pulmonary disease) ([place])     CORONARY ARTERY DISEASE 11/23/2009   DECREASED HEARING, 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   DEGENERATIVE JOINT DISEASE 11/23/2009   DEPRESSION 11/23/2009   FATIGUE 11/23/2009   GAIT DISTURBANCE 12/10/2009   HEMOPTYSIS UNSPECIFIED 05/07/2010   High cholesterol     HYPERTENSION 07/30/2009   HYPOTHYROIDISM 07/30/2009   Ischemic cardiomyopathy 09/02/2017   LUMBAR RADICULOPATHY, 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   On home oxygen therapy      "2-3L; 24/7" (09/10/2016)   OSA on CPAP     Pneumonia     PTSD (post-traumatic stress disorder) 03/10/2012   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   RA (rheumatoid arthritis) ([place]) 06/11/2011    "qwhere" (09/10/2016)   RESPIRATORY FAILURE, CHRONIC 07/31/2009   Scleritis of both eyes 03/17/2014   Thrombocytopenia ([place])     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   Type II diabetes mellitus ([place])             Past Surgical History:  Procedure Laterality Date   ABDOMINAL AORTIC ANEURYSM REPAIR   07/2002    Archie Endo 12/10/2010   ABDOMINAL AORTOGRAM W/LOWER EXTREMITY N/A 08/16/2020    Procedure: ABDOMINAL AORTOGRAM W/LOWER EXTREMITY;  Surgeon: [name], MD;  Location: [facility];  Service: Cardiovascular;  Laterality: N/A;   ABDOMINAL AORTOGRAM W/LOWER EXTREMITY N/A 10/24/2021    Procedure: ABDOMINAL AORTOGRAM W/LOWER EXTREMITY;  Surgeon: [name], MD;  Location: [place] [facility];  Service: Cardiovascular;  Laterality: N/A;   ABDOMINAL EXPLORATION SURGERY   02/2004    w/LOA/notes 12/10/2010; small bowel obstruction repair with adhesiolysis    BACK SURGERY       CARDIAC CATHETERIZATION        2 heart caths in the past.  One in 2000s showed one ulcerated plaque  Rx medically; [facility] /notes 09/05/2016   CATARACT EXTRACTION W/ INTRAOCULAR LENS  IMPLANT, BILATERAL Bilateral 2000s    COLECTOMY        hx of remote ileum resection due to bleeding   COLONOSCOPY WITH PROPOFOL N/A 01/22/2018    Procedure: COLONOSCOPY WITH PROPOFOL;  Surgeon: Gatha Mayer, MD;  Location: [facility];  Service: Endoscopy;  Laterality: N/A;   CORONARY ANGIOPLASTY WITH STENT PLACEMENT   09/10/2016   CORONARY STENT INTERVENTION N/A 09/10/2016    Procedure: Coronary Stent Intervention;  Surgeon: Burnell Blanks, MD;  Location: [place] [facility];  Service: Cardiovascular;  Laterality: N/A;  Distal RCA 4.0x16 Synergy   ESOPHAGOGASTRODUODENOSCOPY (EGD) WITH PROPOFOL N/A 01/22/2018    Procedure: ESOPHAGOGASTRODUODENOSCOPY (EGD) WITH PROPOFOL;  Surgeon: Gatha Mayer, MD;  Location: [facility];  Service: Endoscopy;  Laterality: N/A;   EYE SURGERY       FALSE ANEURYSM REPAIR Left 08/22/2020    Procedure: EXCLUSION OF LEFT POPLITEAL ARTERY ANEURYSM;  Surgeon: [name], MD;  Location: [place];  Service: Vascular;  Laterality: Left;   FEMORAL EMBOLOECTOMY Left 07/2000    with left leg ischemia; Dr. Kellie Simmering, vascular   FEMORAL-POPLITEAL BYPASS GRAFT Bilateral 08/22/2020    Procedure: LEFT FEMORAL-POSTERIOR TIBIAL ARTERY BYPASS GRAFT USING RIGHT GREATER NONREVERSED SAPHENOUS VEIN GRAFT;  Surgeon: [name], MD;  Location: [facility];  Service: Vascular;  Laterality: Bilateral;   GANGLION CYST EXCISION Right      "wrist"; Dr. Daylene Katayama   HOT HEMOSTASIS N/A 01/22/2018    Procedure: HOT HEMOSTASIS (ARGON PLASMA COAGULATION/BICAP);  Surgeon: Gatha Mayer, MD;  Location: [facility];  Service: Endoscopy;  Laterality: N/A;   LOOP RECORDER INSERTION N/A 04/25/2019    Procedure: LOOP RECORDER INSERTION;  Surgeon: Deboraha Sprang, MD;  Location: [place] [facility];  Service: Cardiovascular;  Laterality: N/A;   LOOP RECORDER REMOVAL N/A 07/01/2019    Procedure: LOOP RECORDER REMOVAL;  Surgeon: Deboraha Sprang, MD;  Location: [facility];  Service: Cardiovascular;  Laterality: N/A;   LUMBAR  LAMINECTOMY   1972    Dr. Collie Siad   PACEMAKER IMPLANT N/A 07/01/2019    Procedure: PACEMAKER IMPLANT;  Surgeon: Deboraha Sprang, MD;  Location: [facility];  Service: Cardiovascular;  Laterality: N/A;   PERIPHERAL VASCULAR BALLOON ANGIOPLASTY Left 10/24/2021    Procedure: PERIPHERAL VASCULAR BALLOON ANGIOPLASTY;  Surgeon: [name], MD;  Location: [facility] [facility];  Service: Cardiovascular;  Laterality: Left;   RIGHT/LEFT HEART CATH AND CORONARY ANGIOGRAPHY N/A 09/10/2016    Procedure: Right/Left Heart Cath and Coronary Angiography;  Surgeon: Burnell Blanks, MD;  Location: [place] [facility];  Service: Cardiovascular;  Laterality: N/A;   TONSILLECTOMY               Family History  Problem Relation Age of Onset   Other Mother  gun shot      SOCIAL HISTORY: Social History         Tobacco Use   Smoking status: Former      Packs/day: 2.50      Years: 40.00      Total pack years: 100.00      Types: Cigarettes, Pipe, Cigars      Quit date: 07/28/1998      Years since quitting: 23.6   Smokeless tobacco: Never  Substance Use Topics   Alcohol use: No      Alcohol/week: 0.0 standard drinks of alcohol      No Known Allergies         Current Outpatient Medications  Medication Sig Dispense Refill   albuterol (PROVENTIL) (2.5 MG/3ML) 0.083% nebulizer solution Take 3 mLs (2.5 mg total) by nebulization every 6 (six) hours as needed for wheezing or shortness of breath. 120 mL 12   albuterol (VENTOLIN HFA) 108 (90 Base) MCG/ACT inhaler Inhale 2 puffs into the lungs every 6 (six) hours as needed for wheezing or shortness of breath. 8 g 6   atorvastatin (LIPITOR) 40 MG tablet Take 1 tablet (40 mg total) by mouth daily. 90 tablet 3   Carboxymethylcellul-Glycerin (LUBRICATING EYE DROPS OP) Place 1 drop into both eyes 4 (four) times daily as needed (dry eyes).        Cholecalciferol (VITAMIN D3) 2000 units capsule Take 2,000 Units by mouth daily.        clopidogrel  (PLAVIX) 75 MG tablet Take 1 tablet (75 mg total) by mouth daily. 30 tablet 11   Continuous Blood Gluc Transmit (DEXCOM G6 TRANSMITTER) MISC by Does not apply route.       empagliflozin (JARDIANCE) 10 MG TABS tablet Take 5 mg by mouth daily. Filled at [place].       fluticasone (FLONASE) 50 MCG/ACT nasal spray Place 1 spray into both nostrils daily. 18.2 mL 2   gabapentin (NEURONTIN) 100 MG capsule Take 100 mg by mouth at bedtime.       insulin glargine (LANTUS) 100 UNIT/ML injection Inject 40 Units into the skin at bedtime.       levothyroxine (SYNTHROID) 175 MCG tablet Take 175 mcg by mouth daily before breakfast.       losartan (COZAAR) 50 MG tablet Take 1 tablet (50 mg total) by mouth daily. 90 tablet 3   metFORMIN (GLUCOPHAGE) 500 MG tablet Take 500-1,000 mg by mouth 2 (two) times daily with a meal. Take 1 tablet (500 mg) by mouth in the morning & take 2 tablets (1000 mg) by mouth in the evening.       Multiple Vitamins-Minerals (MEGA MULTIVITAMIN FOR MEN PO) Take 1 tablet by mouth daily.       OXYGEN Inhale 2 L into the lungs continuous.       predniSONE (DELTASONE) 5 MG tablet TAKE 1 TABLET(5 MG) BY MOUTH DAILY WITH BREAKFAST 90 tablet 3   Semaglutide (OZEMPIC, 1 MG/DOSE, ) Inject 1 mg into the skin every Friday.       tamsulosin (FLOMAX) 0.4 MG CAPS capsule Take 1 capsule (0.4 mg total) by mouth at bedtime. 30 capsule 0   Tiotropium Bromide-Olodaterol (STIOLTO RESPIMAT) 2.5-2.5 MCG/ACT AERS Inhale 2 puffs into the lungs daily. 4 g 0    No current facility-administered medications for this visit.      REVIEW OF SYSTEMS:  [X]  denotes positive finding, [ ]  denotes negative finding Cardiac   Comments:  Chest pain or chest pressure:  Shortness of breath upon exertion:      Short of breath when lying flat:      Irregular heart rhythm:             Vascular      Pain in calf, thigh, or hip brought on by ambulation:      Pain in feet at night that wakes you up from your sleep:        Blood clot in your veins:      Leg swelling:              Pulmonary      Oxygen at home:      Productive cough:       Wheezing:              Neurologic      Sudden weakness in arms or legs:       Sudden numbness in arms or legs:       Sudden onset of difficulty speaking or slurred speech:      Temporary loss of vision in one eye:       Problems with dizziness:              Gastrointestinal      Blood in stool:       Vomited blood:              Genitourinary      Burning when urinating:       Blood in urine:             Psychiatric      Major depression:              Hematologic      Bleeding problems:      Problems with blood clotting too easily:             Skin      Rashes or ulcers:             Constitutional      Fever or chills:          PHYSICAL EXAM:    Vitals:    03/04/22 1410  BP: (!) 159/72  Pulse: 60  Resp: 18  Temp: 97.8 F (36.6 C)  TempSrc: Temporal  SpO2: 98%  Weight: 200 lb (90.7 kg)  Height: 6\' 2"  (1.88 m)      GENERAL: The patient is a well-nourished male, in no acute distress. The vital signs are documented above. CARDIAC: There is a regular rate and rhythm.  VASCULAR:  Bilateral femoral pulses palpable Neuro: Grossly neurologically intact   DATA:    ABIs on the left have dropped from 1.0 to 0.63   Left lower extremity arterial duplex shows patent bypass graft with high-grade stenosis at the distal anastomosis and outflow >70% with a velocity 626.   Assessment/Plan:   81 year-old male status post left common femoral to PT bypass on 08/22/2020 requiring harvest of right leg great saphenous vein for 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  He developed a significant stenosis in the distal bypass that was treated with balloon angioplasty on 10/24/21 with good results.  This looked much better on last follow-up but unfortunately on 40-month interval surveillance today he has evidence of a recurrent high-grade  stenosis at the distal anastomosis.  I have recommended another lower extremity arteriogram with intervention likely from a brachial approach to maintain patency of the bypass.  We had significant difficulty getting  over his aortic bifurcation last time given a previous aortobiiliac bypass for abdominal aortic aneurysm.  We will schedule for next Thursday on 03/13/2022.  Discussed he continue plavix from my standpoint.   Cephus Shelling, MD [facility] Office: [phone]

## 2022-03-14 ENCOUNTER — Encounter (HOSPITAL_COMMUNITY): Payer: Self-pay | Admitting: Vascular Surgery

## 2022-03-14 ENCOUNTER — Telehealth: Payer: Self-pay | Admitting: *Deleted

## 2022-03-14 NOTE — Telephone Encounter (Signed)
Appt has been scheduled.

## 2022-03-14 NOTE — Telephone Encounter (Signed)
Pt called requesting appt with Dr.Gudena and labs. Scheduling message was sent to schedule pt within the next 2 weeks.

## 2022-03-17 ENCOUNTER — Telehealth: Payer: Self-pay | Admitting: Hematology and Oncology

## 2022-03-17 NOTE — Telephone Encounter (Signed)
Scheduled appointment per 8/18 staff message. Patient is aware of the upcoming appointments.

## 2022-03-26 ENCOUNTER — Other Ambulatory Visit: Payer: Self-pay | Admitting: *Deleted

## 2022-03-26 DIAGNOSIS — D693 Immune thrombocytopenic purpura: Secondary | ICD-10-CM

## 2022-03-26 NOTE — Progress Notes (Signed)
Patient Care Team: Myrlene Broker, MD as PCP - General (Internal Medicine) Ernesto Rutherford, MD as Consulting Physician (Ophthalmology) Kalman Shan, MD as Consulting Physician (Pulmonary Disease) System, Provider Not In as Consulting Physician Casimer Lanius, MD as Consulting Physician (Rheumatology) Oretha Milch, MD as Consulting Physician (Pulmonary Disease) Lewayne Bunting, MD as Consulting Physician (Cardiology)  DIAGNOSIS: No diagnosis found.  SUMMARY OF ONCOLOGIC HISTORY: Oncology History   No history exists.    CHIEF COMPLIANT: Follow-up of acute ITP  INTERVAL HISTORY: [name] is a 81 y.o. 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 He presents to the clinic today for follow-up.     ALLERGIES:  has No Known Allergies.  MEDICATIONS:  Current Outpatient Medications  Medication Sig Dispense Refill   acetaminophen (TYLENOL) 500 MG tablet Take 1,000 mg by mouth every 6 (six) hours as needed for mild pain.     albuterol (PROVENTIL) (2.5 MG/3ML) 0.083% nebulizer solution Take 3 mLs (2.5 mg total) by nebulization every 6 (six) hours as needed for wheezing or shortness of breath. 120 mL 12   albuterol (VENTOLIN HFA) 108 (90 Base) MCG/ACT inhaler Inhale 2 puffs into the lungs every 6 (six) hours as needed for wheezing or shortness of breath. 8 g 6   atorvastatin (LIPITOR) 40 MG tablet Take 1 tablet (40 mg total) by mouth daily. 90 tablet 3   Carboxymethylcellul-Glycerin (LUBRICATING EYE DROPS OP) Place 1 drop into both eyes 4 (four) times daily as needed (dry eyes).      Cholecalciferol (VITAMIN D3) 2000 units capsule Take 2,000 Units by mouth daily.      clopidogrel (PLAVIX) 75 MG tablet Take 1 tablet (75 mg total) by mouth daily. 30 tablet 11   Continuous Blood Gluc Transmit (DEXCOM G6 TRANSMITTER) MISC by Does not apply route.     empagliflozin (JARDIANCE) 10 MG TABS tablet Take 5 mg by mouth daily. Filled at  [place].     fluticasone (FLONASE) 50 MCG/ACT nasal spray Place 1 spray into both nostrils daily. (Patient taking differently: Place 1 spray into both nostrils daily as needed for allergies.) 18.2 mL 2   gabapentin (NEURONTIN) 100 MG capsule Take 100 mg by mouth at bedtime.     insulin glargine (LANTUS) 100 UNIT/ML injection Inject 30 Units into the skin at bedtime.     levothyroxine (SYNTHROID) 175 MCG tablet Take 175 mcg by mouth daily before breakfast.     losartan (COZAAR) 50 MG tablet Take 1 tablet (50 mg total) by mouth daily. 90 tablet 3   metFORMIN (GLUCOPHAGE) 500 MG tablet Take 500-1,000 mg by mouth 2 (two) times daily with a meal. Take 1 tablet (500 mg) by mouth in the morning & take 2 tablets (1000 mg) by mouth in the evening.     Multiple Vitamins-Minerals (MEGA MULTIVITAMIN FOR MEN PO) Take 1 tablet by mouth daily.     OXYGEN Inhale 2 L into the lungs continuous.     predniSONE (DELTASONE) 5 MG tablet TAKE 1 TABLET(5 MG) BY MOUTH DAILY WITH BREAKFAST (Patient taking differently: Take 5 mg by mouth daily with breakfast.) 90 tablet 3   Semaglutide (OZEMPIC, 1 MG/DOSE, [place]) Inject 1 mg into the skin every Friday.     tamsulosin (FLOMAX) 0.4 MG CAPS capsule Take 1 capsule (0.4 mg total) by mouth at bedtime. 30 capsule 0   Tiotropium Bromide-Olodaterol (STIOLTO RESPIMAT) 2.5-2.5 MCG/ACT AERS Inhale 2 puffs into the lungs daily. 4 g 0  No current facility-administered medications for this visit.    PHYSICAL EXAMINATION: ECOG PERFORMANCE STATUS: {CHL ONC ECOG [phone]}  There were no vitals filed for this visit. There were no vitals filed for this visit.  BREAST:*** No palpable masses or nodules in either right or left breasts. No palpable axillary supraclavicular or infraclavicular adenopathy no breast tenderness or nipple discharge. (exam performed in the presence of a chaperone)  LABORATORY DATA:  I have reviewed the data as listed    Latest Ref Rng & Units 03/13/2022    7:36 AM  10/24/2021    8:58 AM 10/16/2021   10:47 AM  CMP  Glucose 70 - 99 mg/dL 89  83  809   BUN 8 - 23 mg/dL 30  18  20    Creatinine 0.61 - 1.24 mg/dL  9.83  3.82   Sodium 135 - 145 mmol/L 142  144  140   Potassium 3.5 - 5.1 mmol/L 4.4  3.9  4.8   Chloride 98 - 111 mmol/L 104  107  101   CO2 20 - 29 mmol/L   19   Calcium 8.6 - 10.2 mg/dL   9.5     Lab Results  Component Value Date   WBC 6.8 08/29/2021   HGB 13.3 03/13/2022   HCT 39.0 03/13/2022   MCV 84.6 08/29/2021   PLT 101 (L) 08/29/2021   NEUTROABS 4.6 08/29/2021    ASSESSMENT & PLAN:  No problem-specific Assessment & Plan notes found for this encounter.    No orders of the defined types were placed in this encounter.  The patient has a good understanding of the overall plan. he agrees with it. he will call with any problems that may develop before the next visit here. Total time spent: 30 mins including face to face time and time spent for planning, charting and co-ordination of care   10/27/2021, CMA 03/26/22    I 03/28/22 am scribing for Dr. Janan Ridge  ***

## 2022-03-27 ENCOUNTER — Inpatient Hospital Stay: Payer: Medicare PPO | Admitting: Hematology and Oncology

## 2022-03-27 ENCOUNTER — Other Ambulatory Visit: Payer: Self-pay

## 2022-03-27 ENCOUNTER — Inpatient Hospital Stay: Payer: Medicare PPO | Attending: Hematology and Oncology

## 2022-03-27 DIAGNOSIS — D693 Immune thrombocytopenic purpura: Secondary | ICD-10-CM

## 2022-03-27 DIAGNOSIS — Z79890 Hormone replacement therapy (postmenopausal): Secondary | ICD-10-CM | POA: Diagnosis not present

## 2022-03-27 DIAGNOSIS — Z7984 Long term (current) use of oral hypoglycemic drugs: Secondary | ICD-10-CM | POA: Diagnosis not present

## 2022-03-27 DIAGNOSIS — Z79899 Other long term (current) drug therapy: Secondary | ICD-10-CM | POA: Diagnosis not present

## 2022-03-27 DIAGNOSIS — Z7902 Long term (current) use of antithrombotics/antiplatelets: Secondary | ICD-10-CM | POA: Diagnosis not present

## 2022-03-27 DIAGNOSIS — Z7952 Long term (current) use of systemic steroids: Secondary | ICD-10-CM | POA: Diagnosis not present

## 2022-03-27 LAB — CBC WITH DIFFERENTIAL (CANCER CENTER ONLY)
Abs Immature Granulocytes: 0.03 10*3/uL (ref 0.00–0.07)
Basophils Absolute: 0 10*3/uL (ref 0.0–0.1)
Basophils Relative: 1 %
Eosinophils Absolute: 0.1 10*3/uL (ref 0.0–0.5)
Eosinophils Relative: 1 %
HCT: 38.3 % — ABNORMAL LOW (ref 39.0–52.0)
Hemoglobin: 12.1 g/dL — ABNORMAL LOW (ref 13.0–17.0)
Immature Granulocytes: 1 %
Lymphocytes Relative: 13 %
Lymphs Abs: 0.8 10*3/uL (ref 0.7–4.0)
MCH: 26 pg (ref 26.0–34.0)
MCHC: 31.6 g/dL (ref 30.0–36.0)
MCV: 82.2 fL (ref 80.0–100.0)
Monocytes Absolute: 0.5 10*3/uL (ref 0.1–1.0)
Monocytes Relative: 9 %
Neutro Abs: 4.5 10*3/uL (ref 1.7–7.7)
Neutrophils Relative %: 75 %
Platelet Count: 85 10*3/uL — ABNORMAL LOW (ref 150–400)
RBC: 4.66 MIL/uL (ref 4.22–5.81)
RDW: 15.3 % (ref 11.5–15.5)
WBC Count: 5.9 10*3/uL (ref 4.0–10.5)
nRBC: 0 % (ref 0.0–0.2)

## 2022-03-27 MED ORDER — PREGABALIN 75 MG PO CAPS: 75.0000 mg | ORAL_CAPSULE | Freq: Two times a day (BID) | ORAL | Status: DC

## 2022-03-27 NOTE — Assessment & Plan Note (Signed)
Relapsed acute ITP Prior treatment: Prednisone started 07/05/2019 completed 10/23/2019 Lab review: 02/08/2020:Platelet count 37 with blood with expectorationstarted on prednisone 02/15/2020:Platelet count 98 03/19/2020: Platelet count:68 04/16/2020: Platelet count64(prednisone dose lowered to 5 mg) 06/25/2020: Platelets 38 07/09/2020: Platelets 73 (continue with 5 mg prednisone) 08/26/2020: Platelets73 3/22: Platelets 67(prednisone discontinued) 11/01/2020: Platelets 37 (prednisone 5 mg restarted) 11/29/2020: Platelets 68 03/28/2021: Platelets 54 08/29/2021: Platelets 101  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 He has a stent plan for the right leg coming up.   Based on my discussions with Dr. Vassie Loll, we decided that he should stay on 5 mg of prednisone indefinitely. Return to clinic in 6 months with labs and follow-up

## 2022-03-28 ENCOUNTER — Ambulatory Visit (INDEPENDENT_AMBULATORY_CARE_PROVIDER_SITE_OTHER): Payer: Medicare PPO

## 2022-03-28 DIAGNOSIS — I255 Ischemic cardiomyopathy: Secondary | ICD-10-CM | POA: Diagnosis not present

## 2022-04-01 LAB — CUP PACEART REMOTE DEVICE CHECK
Battery Remaining Longevity: 128 mo
Battery Voltage: 3.01 V
Brady Statistic AP VP Percent: 54.34 %
Brady Statistic AP VS Percent: 0.95 %
Brady Statistic AS VP Percent: 25 %
Brady Statistic AS VS Percent: 19.72 %
Brady Statistic RA Percent Paced: 56.06 %
Brady Statistic RV Percent Paced: 79.34 %
Date Time Interrogation Session: 20230901053753
Implantable Lead Implant Date: 20201204
Implantable Lead Implant Date: 20201204
Implantable Lead Location: 753859
Implantable Lead Location: 753860
Implantable Lead Model: 5076
Implantable Lead Model: 5076
Implantable Pulse Generator Implant Date: 20201204
Lead Channel Impedance Value: 342 Ohm
Lead Channel Impedance Value: 361 Ohm
Lead Channel Impedance Value: 399 Ohm
Lead Channel Impedance Value: 418 Ohm
Lead Channel Pacing Threshold Amplitude: 0.5 V
Lead Channel Pacing Threshold Amplitude: 0.5 V
Lead Channel Pacing Threshold Pulse Width: 0.4 ms
Lead Channel Pacing Threshold Pulse Width: 0.4 ms
Lead Channel Sensing Intrinsic Amplitude: 1.875 mV
Lead Channel Sensing Intrinsic Amplitude: 1.875 mV
Lead Channel Sensing Intrinsic Amplitude: 7.25 mV
Lead Channel Sensing Intrinsic Amplitude: 7.25 mV
Lead Channel Setting Pacing Amplitude: 1.5 V
Lead Channel Setting Pacing Amplitude: 2.5 V
Lead Channel Setting Pacing Pulse Width: 0.4 ms
Lead Channel Setting Sensing Sensitivity: 2 mV

## 2022-04-15 ENCOUNTER — Ambulatory Visit (INDEPENDENT_AMBULATORY_CARE_PROVIDER_SITE_OTHER): Payer: Medicare PPO | Admitting: Vascular Surgery

## 2022-04-15 ENCOUNTER — Encounter: Payer: Self-pay | Admitting: Vascular Surgery

## 2022-04-15 VITALS — BP 186/80 | HR 60 | Temp 97.9°F | Resp 16 | Ht 74.0 in | Wt 193.0 lb

## 2022-04-15 DIAGNOSIS — I724 Aneurysm of artery of lower extremity: Secondary | ICD-10-CM

## 2022-04-15 NOTE — Progress Notes (Signed)
Patient name: [name] MRN: [id number] DOB: April 23, 1941 Sex: male  REASON FOR VISIT:.  Follow-up  HPI: [name] is a 81 y.o. male with multiple medical comorbidities including COPD (on home oxygen) and CHF who presents for follow-up.  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  Most recently on 10/24/2021 he underwent angioplasty of the distal left common femoral to PT vein bypass with a 4 mm angioplasty balloon given high grade stenosis.  He then developed a recurrent stenosis and we took him on 03/13/2022 for another left lower extremity intervention and his bypass was found to be occluded.  No intervention was performed given this was a transradial approach with a silent occlusion.  Of note he does have a history of an aortobiiliac bypass for AAA in 2004.  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  He has been delaying intervention on the right popliteal aneurysm given this is not good anatomy for stent graft and he does not feel he can sustain another bypass.   Past Medical History:  Diagnosis Date   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   ablated   Anxiety    Aortic aneurysm ([place]) 09/02/2017   BENIGN PROSTATIC HYPERTROPHY 11/23/2009   Cardiomyopathy ([place]) [id number]   Chronic systolic CHF (congestive heart failure) ([place]) 09/02/2017   COPD (chronic obstructive pulmonary disease) ([place])    CORONARY ARTERY DISEASE 11/23/2009   DECREASED HEARING, 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   DEGENERATIVE JOINT DISEASE 11/23/2009   DEPRESSION 11/23/2009   FATIGUE 11/23/2009   GAIT DISTURBANCE 12/10/2009   HEMOPTYSIS UNSPECIFIED 05/07/2010   High cholesterol    HYPERTENSION 07/30/2009   HYPOTHYROIDISM 07/30/2009   Ischemic cardiomyopathy 09/02/2017   LUMBAR RADICULOPATHY, 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   On  home oxygen therapy    "2-3L; 24/7" (09/10/2016)   OSA on CPAP    Pneumonia    PTSD (post-traumatic stress disorder) 03/10/2012   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   RA (rheumatoid arthritis) ([place]) 06/11/2011   "qwhere" (09/10/2016)   RESPIRATORY FAILURE, CHRONIC 07/31/2009   Scleritis of both eyes 03/17/2014   Thrombocytopenia ([place])    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   Type II diabetes mellitus ([place])     Past Surgical History:  Procedure Laterality Date   ABDOMINAL AORTIC ANEURYSM REPAIR  07/2002   Archie Endo 12/10/2010   ABDOMINAL AORTOGRAM W/LOWER EXTREMITY N/A 08/16/2020   Procedure: ABDOMINAL AORTOGRAM W/LOWER EXTREMITY;  Surgeon: [name], MD;  Location: [facility];  Service: Cardiovascular;  Laterality: N/A;   ABDOMINAL AORTOGRAM W/LOWER EXTREMITY N/A 10/24/2021   Procedure: ABDOMINAL AORTOGRAM W/LOWER EXTREMITY;  Surgeon: [name], MD;  Location: [facility];  Service: Cardiovascular;  Laterality: N/A;   ABDOMINAL AORTOGRAM W/LOWER EXTREMITY N/A 03/13/2022   Procedure: ABDOMINAL AORTOGRAM W/LOWER EXTREMITY;  Surgeon: [name], MD;  Location: [facility];  Service: Cardiovascular;  Laterality: N/A;   ABDOMINAL EXPLORATION SURGERY  02/2004   w/LOA/notes 12/10/2010; small bowel obstruction repair with adhesiolysis    BACK SURGERY     CARDIAC CATHETERIZATION     2 heart caths in the past.  One in 2000s showed one ulcerated plaque  Rx medically; Second at [facility] /notes 09/05/2016   CATARACT EXTRACTION W/ INTRAOCULAR LENS  IMPLANT, BILATERAL Bilateral 2000s   COLECTOMY  hx of remote ileum resection due to bleeding   COLONOSCOPY WITH PROPOFOL N/A 01/22/2018   Procedure: COLONOSCOPY WITH PROPOFOL;  Surgeon: Gatha Mayer, MD;  Location: [facility];  Service: Endoscopy;  Laterality: N/A;   CORONARY ANGIOPLASTY WITH STENT PLACEMENT  09/10/2016   CORONARY STENT INTERVENTION N/A 09/10/2016   Procedure: Coronary Stent Intervention;  Surgeon: Burnell Blanks, MD;  Location: [facility];  Service: Cardiovascular;  Laterality: N/A;  Distal RCA 4.0x16 Synergy   ESOPHAGOGASTRODUODENOSCOPY (EGD) WITH PROPOFOL N/A 01/22/2018   Procedure: ESOPHAGOGASTRODUODENOSCOPY (EGD) WITH PROPOFOL;  Surgeon: Gatha Mayer, MD;  Location: [facility];  Service: Endoscopy;  Laterality: N/A;   EYE SURGERY     FALSE ANEURYSM REPAIR Left 08/22/2020   Procedure: EXCLUSION OF LEFT POPLITEAL ARTERY ANEURYSM;  Surgeon: [name], MD;  Location: [facility];  Service: Vascular;  Laterality: Left;   FEMORAL EMBOLOECTOMY Left 07/2000   with left leg ischemia; Dr. Kellie Simmering, vascular   FEMORAL-POPLITEAL BYPASS GRAFT Bilateral 08/22/2020   Procedure: LEFT FEMORAL-POSTERIOR TIBIAL ARTERY BYPASS GRAFT USING RIGHT GREATER NONREVERSED SAPHENOUS VEIN GRAFT;  Surgeon: [name], MD;  Location: [facility];  Service: Vascular;  Laterality: Bilateral;   GANGLION CYST EXCISION Right    "wrist"; Dr. Daylene Katayama   HOT HEMOSTASIS N/A 01/22/2018   Procedure: HOT HEMOSTASIS (ARGON PLASMA COAGULATION/BICAP);  Surgeon: Gatha Mayer, MD;  Location: [facility];  Service: Endoscopy;  Laterality: N/A;   LOOP RECORDER INSERTION N/A 04/25/2019   Procedure: LOOP RECORDER INSERTION;  Surgeon: Deboraha Sprang, MD;  Location: [facility];  Service: Cardiovascular;  Laterality: N/A;   LOOP RECORDER REMOVAL N/A 07/01/2019   Procedure: LOOP RECORDER REMOVAL;  Surgeon: Deboraha Sprang, MD;  Location: [facility];  Service: Cardiovascular;  Laterality: N/A;   LUMBAR LAMINECTOMY  1972   Dr. Collie Siad   PACEMAKER IMPLANT N/A 07/01/2019   Procedure: PACEMAKER IMPLANT;  Surgeon: Deboraha Sprang, MD;  Location: [facility];  Service: Cardiovascular;  Laterality: N/A;   PERIPHERAL VASCULAR BALLOON ANGIOPLASTY Left 10/24/2021   Procedure: PERIPHERAL VASCULAR BALLOON ANGIOPLASTY;  Surgeon: [name], MD;  Location: [facility];  Service: Cardiovascular;  Laterality:  Left;   RIGHT/LEFT HEART CATH AND CORONARY ANGIOGRAPHY N/A 09/10/2016   Procedure: Right/Left Heart Cath and Coronary Angiography;  Surgeon: Burnell Blanks, MD;  Location: [facility];  Service: Cardiovascular;  Laterality: N/A;   TONSILLECTOMY      Family History  Problem Relation Age of Onset   Other Mother        gun shot    SOCIAL HISTORY: Social History   Tobacco Use   Smoking status: Former    Packs/day: 2.50    Years: 40.00    Total pack years: 100.00    Types: Cigarettes, Pipe, Cigars    Quit date: 07/28/1998    Years since quitting: 23.7   Smokeless tobacco: Never  Substance Use Topics   Alcohol use: No    Alcohol/week: 0.0 standard drinks of alcohol    No Known Allergies  Current Outpatient Medications  Medication Sig Dispense Refill   acetaminophen (TYLENOL) 500 MG tablet Take 1,000 mg by mouth every 6 (six) hours as needed for mild pain.     albuterol (PROVENTIL) (2.5 MG/3ML) 0.083% nebulizer solution Take 3 mLs (2.5 mg total) by nebulization every 6 (six) hours as needed for wheezing or shortness of breath. 120 mL 12   albuterol (VENTOLIN HFA)  108 (90 Base) MCG/ACT inhaler Inhale 2 puffs into the lungs every 6 (six) hours as needed for wheezing or shortness of breath. 8 g 6   atorvastatin (LIPITOR) 40 MG tablet Take 1 tablet (40 mg total) by mouth daily. 90 tablet 3   Carboxymethylcellul-Glycerin (LUBRICATING EYE DROPS OP) Place 1 drop into both eyes 4 (four) times daily as needed (dry eyes).      Cholecalciferol (VITAMIN D3) 2000 units capsule Take 2,000 Units by mouth daily.      clopidogrel (PLAVIX) 75 MG tablet Take 1 tablet (75 mg total) by mouth daily. 30 tablet 11   Continuous Blood Gluc Transmit (DEXCOM G6 TRANSMITTER) MISC by Does not apply route.     empagliflozin (JARDIANCE) 10 MG TABS tablet Take 5 mg by mouth daily. Filled at [facility].     fluticasone (FLONASE) 50 MCG/ACT nasal spray Place 1 spray into both nostrils daily. (Patient taking  differently: Place 1 spray into both nostrils daily as needed for allergies.) 18.2 mL 2   insulin glargine (LANTUS) 100 UNIT/ML injection Inject 30 Units into the skin at bedtime.     levothyroxine (SYNTHROID) 175 MCG tablet Take 175 mcg by mouth daily before breakfast.     losartan (COZAAR) 50 MG tablet Take 1 tablet (50 mg total) by mouth daily. 90 tablet 3   metFORMIN (GLUCOPHAGE) 500 MG tablet Take 500-1,000 mg by mouth 2 (two) times daily with a meal. Take 1 tablet (500 mg) by mouth in the morning & take 2 tablets (1000 mg) by mouth in the evening.     Multiple Vitamins-Minerals (MEGA MULTIVITAMIN FOR MEN PO) Take 1 tablet by mouth daily.     OXYGEN Inhale 2 L into the lungs continuous.     predniSONE (DELTASONE) 5 MG tablet TAKE 1 TABLET(5 MG) BY MOUTH DAILY WITH BREAKFAST (Patient taking differently: Take 5 mg by mouth daily with breakfast.) 90 tablet 3   pregabalin (LYRICA) 75 MG capsule Take 1 capsule (75 mg total) by mouth 2 (two) times daily.     Semaglutide (OZEMPIC, 1 MG/DOSE, [place]) Inject 1 mg into the skin every Friday.     tamsulosin (FLOMAX) 0.4 MG CAPS capsule Take 1 capsule (0.4 mg total) by mouth at bedtime. 30 capsule 0   Tiotropium Bromide-Olodaterol (STIOLTO RESPIMAT) 2.5-2.5 MCG/ACT AERS Inhale 2 puffs into the lungs daily. 4 g 0   No current facility-administered medications for this visit.    REVIEW OF SYSTEMS:  [X]  denotes positive finding, [ ]  denotes negative finding Cardiac  Comments:  Chest pain or chest pressure:    Shortness of breath upon exertion:    Short of breath when lying flat:    Irregular heart rhythm:        Vascular    Pain in calf, thigh, or hip brought on by ambulation:    Pain in feet at night that wakes you up from your sleep:     Blood clot in your veins:    Leg swelling:         Pulmonary    Oxygen at home:    Productive cough:     Wheezing:         Neurologic    Sudden weakness in arms or legs:     Sudden numbness in arms or legs:      Sudden onset of difficulty speaking or slurred speech:    Temporary loss of vision in one eye:     Problems with dizziness:  Gastrointestinal    Blood in stool:     Vomited blood:         Genitourinary    Burning when urinating:     Blood in urine:        Psychiatric    Major depression:         Hematologic    Bleeding problems:    Problems with blood clotting too easily:        Skin    Rashes or ulcers:        Constitutional    Fever or chills:      PHYSICAL EXAM: Vitals:   04/15/22 1518  BP: (!) 186/80  Pulse: 60  Resp: 16  Temp: 97.9 F (36.6 C)  TempSrc: Temporal  SpO2: 96%  Weight: 193 lb (87.5 kg)  Height: 6\' 2"  (1.88 m)    GENERAL: The patient is a well-nourished male, in no acute distress. The vital signs are documented above. CARDIAC: There is a regular rate and rhythm.  VASCULAR:  Bilateral femoral pulses palpable Right DP palpable Left PT monophasic Neuro: Grossly neurologically intact  DATA:   N/A  Assessment/Plan:  81 year-old male status post left common femoral to PT bypass on 08/22/2020 requiring harvest of right leg great saphenous vein for 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  He developed a significant stenosis in the distal bypass that was treated with balloon angioplasty on 10/24/21 with good results.  This looked much better on last follow-up but unfortunately on 90-month interval surveillance he had evidence of a recurrent high-grade stenosis at the distal anastomosis.  When I took him for lower extremity angiogram last month he was found to have an occluded bypass.  This was done from a transradial approach given his aortobiiliac bypass years ago.  No intervention was performed given we did not have any devices long enough for intervention.  Again discussed this was a silent occlusion since he was having no significant lower extremity symptoms.  Today he has no foot pain or rest pain or tissue loss.  I  will see him in 6 months with ABIs.  Discussed if he develops symptoms we will have to evaluate for redo bypass.  He also has a right popliteal aneurysm but this is not good anatomy for stent grafting.  He does not feel he can undergo another bypass at this time.  [name], MD [facility] Office: [phone]

## 2022-04-16 NOTE — Progress Notes (Signed)
Remote pacemaker transmission.   

## 2022-04-23 ENCOUNTER — Other Ambulatory Visit: Payer: Self-pay

## 2022-04-23 DIAGNOSIS — I739 Peripheral vascular disease, unspecified: Secondary | ICD-10-CM

## 2022-05-15 DIAGNOSIS — E118 Type 2 diabetes mellitus with unspecified complications: Secondary | ICD-10-CM | POA: Diagnosis not present

## 2022-06-04 NOTE — Progress Notes (Signed)
HPI: FU CAD. 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 Carotid dopplers 2/19 showed no significant stenosis. Admitted with sepsis 2/19 and also with atrial fibrillation, converted spontaneously to sinus; TSH low and synthroid adjusted. Placed on amiodarone. Amiodarone discontinued at previous office visit. Echocardiogram August 2020 showed normal LV function. Had pacemaker placed 12/20 due to history of syncope and pauses noted on monitor. 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 Apixaban on hold. CTA August 2021 showed dilated aortic root at 4 cm, abdominal aorta measuring 3.4 cm, left renal artery measuring 2.1 cm and right internal iliac artery measuring 2 cm.  ABIs May 2022 normal; total occlusion of the anterior tibial artery with patent left femoropopliteal bypass graft noted.  CT March 2023 showed unchanged aneurysmal dilatation of the aortic arch measuring 4.4 x 4.3 cm, 36 mm abdominal aortic aneurysm, emphysema, coronary calcification, enlarged pulmonary artery consistent with pulmonary hypertension.  Peripheral vascular disease followed by vascular surgery.  Since last seen, he has chronic dyspnea on exertion unchanged.  No orthopnea, PND, pedal edema, chest pain or syncope.  Current Outpatient Medications  Medication Sig Dispense Refill   acetaminophen (TYLENOL) 500 MG tablet Take 1,000 mg by mouth every 6 (six) hours as needed for mild pain.     albuterol (PROVENTIL) (2.5 MG/3ML) 0.083% nebulizer solution Take 3 mLs (2.5 mg total) by nebulization every 6 (six) hours as needed for wheezing or shortness of breath. 120 mL 12   albuterol (VENTOLIN HFA) 108 (90 Base) MCG/ACT inhaler Inhale 2 puffs into the lungs every 6 (six) hours as needed for wheezing or shortness of breath. 8 g 6   atorvastatin (LIPITOR) 40 MG tablet Take 1 tablet (40 mg total) by mouth daily. 90 tablet 3   Carboxymethylcellul-Glycerin  (LUBRICATING EYE DROPS OP) Place 1 drop into both eyes 4 (four) times daily as needed (dry eyes).      Cholecalciferol (VITAMIN D3) 2000 units capsule Take 2,000 Units by mouth daily.      clopidogrel (PLAVIX) 75 MG tablet Take 1 tablet (75 mg total) by mouth daily. 30 tablet 11   Continuous Blood Gluc Transmit (DEXCOM G6 TRANSMITTER) MISC by Does not apply route.     empagliflozin (JARDIANCE) 10 MG TABS tablet Take 5 mg by mouth daily. Filled at [place].     ferrous sulfate 324 (65 Fe) MG TBEC Take 1 tablet by mouth daily.     fluticasone (FLONASE) 50 MCG/ACT nasal spray Place 1 spray into both nostrils daily. (Patient taking differently: Place 1 spray into both nostrils daily as needed for allergies.) 18.2 mL 2   insulin glargine (LANTUS) 100 UNIT/ML injection Inject 30 Units into the skin at bedtime.     levothyroxine (SYNTHROID) 175 MCG tablet Take 175 mcg by mouth daily before breakfast.     losartan (COZAAR) 50 MG tablet Take 1 tablet (50 mg total) by mouth daily. 90 tablet 3   metFORMIN (GLUCOPHAGE) 500 MG tablet Take 500-1,000 mg by mouth 2 (two) times daily with a meal. Take 1 tablet (500 mg) by mouth in the morning & take 2 tablets (1000 mg) by mouth in the evening.     Multiple Vitamins-Minerals (MEGA MULTIVITAMIN FOR MEN PO) Take 1 tablet by mouth daily.     OXYGEN Inhale 2 L into the lungs continuous.     predniSONE (DELTASONE) 5 MG tablet TAKE 1 TABLET(5  MG) BY MOUTH DAILY WITH BREAKFAST (Patient taking differently: Take 5 mg by mouth daily with breakfast.) 90 tablet 3   pregabalin (LYRICA) 75 MG capsule Take 1 capsule (75 mg total) by mouth 2 (two) times daily.     Semaglutide (OZEMPIC, 1 MG/DOSE, [place]) Inject 1 mg into the skin every Friday.     tamsulosin (FLOMAX) 0.4 MG CAPS capsule Take 1 capsule (0.4 mg total) by mouth at bedtime. 30 capsule 0   Tiotropium Bromide-Olodaterol (STIOLTO RESPIMAT) 2.5-2.5 MCG/ACT AERS Inhale 2 puffs into the lungs daily. 4 g 0   No current  facility-administered medications for this visit.     Past Medical History:  Diagnosis Date   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   ablated   Anxiety    Aortic aneurysm (HCC) 09/02/2017   BENIGN PROSTATIC HYPERTROPHY 11/23/2009   Cardiomyopathy (HCC) 08/28/2016   Chronic systolic CHF (congestive heart failure) (HCC) 09/02/2017   COPD (chronic obstructive pulmonary disease) (HCC)    CORONARY ARTERY DISEASE 11/23/2009   DECREASED HEARING, 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   DEGENERATIVE JOINT DISEASE 11/23/2009   DEPRESSION 11/23/2009   FATIGUE 11/23/2009   GAIT DISTURBANCE 12/10/2009   HEMOPTYSIS UNSPECIFIED 05/07/2010   High cholesterol    HYPERTENSION 07/30/2009   HYPOTHYROIDISM 07/30/2009   Ischemic cardiomyopathy 09/02/2017   LUMBAR RADICULOPATHY, 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   On home oxygen therapy    "2-3L; 24/7" (09/10/2016)   OSA on CPAP    Pneumonia    PTSD (post-traumatic stress disorder) 03/10/2012   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   RA (rheumatoid arthritis) (HCC) 06/11/2011   "qwhere" (09/10/2016)   RESPIRATORY FAILURE, CHRONIC 07/31/2009   Scleritis of both eyes 03/17/2014   Thrombocytopenia (HCC)    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   Type II diabetes mellitus (HCC)     Past Surgical History:  Procedure Laterality Date   ABDOMINAL AORTIC ANEURYSM REPAIR  07/2002   [name] 12/10/2010   ABDOMINAL AORTOGRAM W/LOWER EXTREMITY N/A 08/16/2020   Procedure: ABDOMINAL AORTOGRAM W/LOWER EXTREMITY;  Surgeon: Cephus Shelling, MD;  Location: [facility];  Service: Cardiovascular;  Laterality: N/A;   ABDOMINAL AORTOGRAM W/LOWER EXTREMITY N/A 10/24/2021   Procedure: ABDOMINAL AORTOGRAM W/LOWER EXTREMITY;  Surgeon: Cephus Shelling, MD;  Location: [facility];  Service: Cardiovascular;  Laterality: N/A;   ABDOMINAL AORTOGRAM W/LOWER EXTREMITY N/A 03/13/2022   Procedure: ABDOMINAL AORTOGRAM W/LOWER EXTREMITY;  Surgeon: Cephus Shelling, MD;  Location: [facility];  Service: Cardiovascular;  Laterality: N/A;    ABDOMINAL EXPLORATION SURGERY  02/2004   w/LOA/notes 12/10/2010; small bowel obstruction repair with adhesiolysis    BACK SURGERY     CARDIAC CATHETERIZATION     2 heart caths in the past.  One in 2000s showed one ulcerated plaque  Rx medically; Second at [facility] /notes 09/05/2016   CATARACT EXTRACTION W/ INTRAOCULAR LENS  IMPLANT, BILATERAL Bilateral 2000s   COLECTOMY     hx of remote ileum resection due to bleeding   COLONOSCOPY WITH PROPOFOL N/A 01/22/2018   Procedure: COLONOSCOPY WITH PROPOFOL;  Surgeon: Iva Boop, MD;  Location: [facility];  Service: Endoscopy;  Laterality: N/A;   CORONARY ANGIOPLASTY WITH STENT PLACEMENT  09/10/2016   CORONARY STENT INTERVENTION N/A 09/10/2016   Procedure: Coronary Stent Intervention;  Surgeon: Kathleene Hazel, MD;  Location: [facility];  Service: Cardiovascular;  Laterality: N/A;  Distal RCA 4.0x16 Synergy   ESOPHAGOGASTRODUODENOSCOPY (EGD) WITH PROPOFOL N/A 01/22/2018   Procedure: ESOPHAGOGASTRODUODENOSCOPY (EGD) WITH PROPOFOL;  Surgeon: Iva Boop,  MD;  Location: [facility];  Service: Endoscopy;  Laterality: N/A;   EYE SURGERY     FALSE ANEURYSM REPAIR Left 08/22/2020   Procedure: EXCLUSION OF LEFT POPLITEAL ARTERY ANEURYSM;  Surgeon: Cephus Shelling, MD;  Location: [facility] OR;  Service: Vascular;  Laterality: Left;   FEMORAL EMBOLOECTOMY Left 07/2000   with left leg ischemia; Dr. Hart Rochester, vascular   FEMORAL-POPLITEAL BYPASS GRAFT Bilateral 08/22/2020   Procedure: LEFT FEMORAL-POSTERIOR TIBIAL ARTERY BYPASS GRAFT USING RIGHT GREATER NONREVERSED SAPHENOUS VEIN GRAFT;  Surgeon: Cephus Shelling, MD;  Location: [facility] OR;  Service: Vascular;  Laterality: Bilateral;   GANGLION CYST EXCISION Right    "wrist"; Dr. Teressa Senter   HOT HEMOSTASIS N/A 01/22/2018   Procedure: HOT HEMOSTASIS (ARGON PLASMA COAGULATION/BICAP);  Surgeon: Iva Boop, MD;  Location: [facility];  Service: Endoscopy;  Laterality: N/A;   LOOP RECORDER INSERTION N/A  04/25/2019   Procedure: LOOP RECORDER INSERTION;  Surgeon: Duke Salvia, MD;  Location: [facility] INVASIVE CV LAB;  Service: Cardiovascular;  Laterality: N/A;   LOOP RECORDER REMOVAL N/A 07/01/2019   Procedure: LOOP RECORDER REMOVAL;  Surgeon: Duke Salvia, MD;  Location: [facility] INVASIVE CV LAB;  Service: Cardiovascular;  Laterality: N/A;   LUMBAR LAMINECTOMY  1972   Dr. Fannie Knee   PACEMAKER IMPLANT N/A 07/01/2019   Procedure: PACEMAKER IMPLANT;  Surgeon: Duke Salvia, MD;  Location: [facility] INVASIVE CV LAB;  Service: Cardiovascular;  Laterality: N/A;   PERIPHERAL VASCULAR BALLOON ANGIOPLASTY Left 10/24/2021   Procedure: PERIPHERAL VASCULAR BALLOON ANGIOPLASTY;  Surgeon: Cephus Shelling, MD;  Location: [facility];  Service: Cardiovascular;  Laterality: Left;   RIGHT/LEFT HEART CATH AND CORONARY ANGIOGRAPHY N/A 09/10/2016   Procedure: Right/Left Heart Cath and Coronary Angiography;  Surgeon: Kathleene Hazel, MD;  Location: [facility] INVASIVE CV LAB;  Service: Cardiovascular;  Laterality: N/A;   TONSILLECTOMY      Social History   Socioeconomic History   Marital status: Widowed    Spouse name: Not on file   Number of children: Not on file   Years of education: Not on file   Highest education level: Not on file  Occupational History   Occupation: disabled veteran, [employer]    Employer: RETIRED  Tobacco Use   Smoking status: Former    Packs/day: 2.50    Years: 40.00    Total pack years: 100.00    Types: Cigarettes, Pipe, Cigars    Quit date: 07/28/1998    Years since quitting: 23.9   Smokeless tobacco: Never  Vaping Use   Vaping Use: Never used  Substance and Sexual Activity   Alcohol use: No    Alcohol/week: 0.0 standard drinks of alcohol   Drug use: No   Sexual activity: Not Currently  Other Topics Concern   Not on file  Social History Narrative   Lives in the home with his wife   Sees [place] every 6 month for meds.   Denies asbestos exposure   Social Determinants of  Health   Financial Resource Strain: Low Risk  (01/17/2022)   Overall Financial Resource Strain (CARDIA)    Difficulty of Paying Living Expenses: Not hard at all  Food Insecurity: No Food Insecurity (03/05/2022)   Hunger Vital Sign    Worried About Running Out of Food in the Last Year: Never true    Ran Out of Food in the Last Year: Never true  Transportation Needs: No Transportation Needs (03/05/2022)   PRAPARE - Transportation    Lack  of Transportation (Medical): No    Lack of Transportation (Non-Medical): No  Physical Activity: Inactive (01/17/2022)   Exercise Vital Sign    Days of Exercise per Week: 0 days    Minutes of Exercise per Session: 0 min  Stress: No Stress Concern Present (01/17/2022)   [employer] of Occupational Health - Occupational Stress Questionnaire    Feeling of Stress : Not at all  Social Connections: Socially Isolated (01/17/2022)   Social Connection and Isolation Panel [NHANES]    Frequency of Communication with Friends and Family: More than three times a week    Frequency of Social Gatherings with Friends and Family: Once a week    Attends Religious Services: Never    Database administrator or Organizations: No    Attends Banker Meetings: Never    Marital Status: Widowed  Intimate Partner Violence: Not At Risk (01/17/2022)   Humiliation, Afraid, Rape, and Kick questionnaire    Fear of Current or Ex-Partner: No    Emotionally Abused: No    Physically Abused: No    Sexually Abused: No    Family History  Problem Relation Age of Onset   Other Mother        gun shot    ROS: no fevers or chills, productive cough, hemoptysis, dysphasia, odynophagia, melena, hematochezia, dysuria, hematuria, rash, seizure activity, orthopnea, PND, pedal edema, claudication. Remaining systems are negative.  Physical Exam: Well-developed well-nourished in no acute distress.  Skin is warm and dry.  HEENT is normal.  Neck is supple.  Chest with diminished  breath sounds throughout Cardiovascular exam is regular rate and rhythm.  Abdominal exam nontender or distended. No masses palpated. Extremities show no edema. neuro grossly intact   A/P  1 coronary artery disease-patient denies chest pain.  Continue statin.  Continue Plavix.  2 paroxysmal atrial fibrillation-patient remains in sinus rhythm.  Anticoagulation discontinued previously due to recurrent hemoptysis, thrombocytopenia and history of ITP.  3 hypertension-patient's blood pressure is controlled.  Continue present medications and follow.  4 hyperlipidemia-continue statin.  Lipids and liver monitored at the [place].  5 cardiomyopathy-LV function improved on most recent echocardiogram.  6 chronic combined systolic/diastolic congestive heart failure-he will continue on diuretic as needed.  7 thoracic/abdominal/iliac aneurysms-patient is followed by vascular surgery.  He will need follow-up CTA March 2024.  8 severe COPD-continue home oxygen.  Patient is followed by pulmonary.  9 ITP-Per hematology.  Olga Millers, MD

## 2022-06-17 DIAGNOSIS — E118 Type 2 diabetes mellitus with unspecified complications: Secondary | ICD-10-CM | POA: Diagnosis not present

## 2022-06-18 ENCOUNTER — Encounter: Payer: Self-pay | Admitting: Cardiology

## 2022-06-18 ENCOUNTER — Ambulatory Visit: Payer: Medicare PPO | Attending: Cardiology | Admitting: Cardiology

## 2022-06-18 VITALS — BP 132/64 | HR 68 | Ht 74.0 in | Wt 200.0 lb

## 2022-06-18 DIAGNOSIS — E7800 Pure hypercholesterolemia, unspecified: Secondary | ICD-10-CM | POA: Diagnosis not present

## 2022-06-18 DIAGNOSIS — I255 Ischemic cardiomyopathy: Secondary | ICD-10-CM | POA: Diagnosis not present

## 2022-06-18 DIAGNOSIS — I480 Paroxysmal atrial fibrillation: Secondary | ICD-10-CM | POA: Diagnosis not present

## 2022-06-18 DIAGNOSIS — I712 Thoracic aortic aneurysm, without rupture, unspecified: Secondary | ICD-10-CM | POA: Diagnosis not present

## 2022-06-18 DIAGNOSIS — I2510 Atherosclerotic heart disease of native coronary artery without angina pectoris: Secondary | ICD-10-CM

## 2022-06-18 NOTE — Patient Instructions (Signed)
  Follow-Up: At [facility], you and your health needs are our priority.  As part of our continuing mission to provide you with exceptional heart care, we have created designated Provider Care Teams.  These Care Teams include your primary Cardiologist (physician) and Advanced Practice Providers (APPs -  Physician Assistants and Nurse Practitioners) who all work together to provide you with the care you need, when you need it.  We recommend signing up for the patient portal called "MyChart".  Sign up information is provided on this After Visit Summary.  MyChart is used to connect with patients for Virtual Visits (Telemedicine).  Patients are able to view lab/test results, encounter notes, upcoming appointments, etc.  Non-urgent messages can be sent to your provider as well.   To learn more about what you can do with MyChart, go to [email or web address].    Your next appointment:   6 month(s)  The format for your next appointment:   In Person  Provider:   Brian Crenshaw, MD    

## 2022-06-24 NOTE — Progress Notes (Signed)
Patient Care Team: [name], MD as PCP - General (Internal Medicine) Stanford Breed Denice Bors, MD as PCP - Cardiology (Cardiology) Clent Jacks, MD as Consulting Physician (Ophthalmology) Brand Males, MD as Consulting Physician (Pulmonary Disease) System, Provider Not In as Consulting Physician Lahoma Rocker, MD as Consulting Physician (Rheumatology) Rigoberto Noel, MD as Consulting Physician (Pulmonary Disease) Lelon Perla, MD as Consulting Physician (Cardiology)  DIAGNOSIS: No diagnosis found.  SUMMARY OF ONCOLOGIC HISTORY: Oncology History   No history exists.    CHIEF COMPLIANT:  Follow-up of acute ITP   INTERVAL HISTORY: [name] is a 81 y.o. 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 He presents to the clinic today for follow-up.     ALLERGIES:  has No Known Allergies.  MEDICATIONS:  Current Outpatient Medications  Medication Sig Dispense Refill   acetaminophen (TYLENOL) 500 MG tablet Take 1,000 mg by mouth every 6 (six) hours as needed for mild pain.     albuterol (PROVENTIL) (2.5 MG/3ML) 0.083% nebulizer solution Take 3 mLs (2.5 mg total) by nebulization every 6 (six) hours as needed for wheezing or shortness of breath. 120 mL 12   albuterol (VENTOLIN HFA) 108 (90 Base) MCG/ACT inhaler Inhale 2 puffs into the lungs every 6 (six) hours as needed for wheezing or shortness of breath. 8 g 6   atorvastatin (LIPITOR) 40 MG tablet Take 1 tablet (40 mg total) by mouth daily. 90 tablet 3   Carboxymethylcellul-Glycerin (LUBRICATING EYE DROPS OP) Place 1 drop into both eyes 4 (four) times daily as needed (dry eyes).      Cholecalciferol (VITAMIN D3) 2000 units capsule Take 2,000 Units by mouth daily.      clopidogrel (PLAVIX) 75 MG tablet Take 1 tablet (75 mg total) by mouth daily. 30 tablet 11   Continuous Blood Gluc Transmit (DEXCOM G6 TRANSMITTER) MISC by Does not apply route.     empagliflozin  (JARDIANCE) 10 MG TABS tablet Take 5 mg by mouth daily. Filled at [place].     ferrous sulfate 324 (65 Fe) MG TBEC Take 1 tablet by mouth daily.     fluticasone (FLONASE) 50 MCG/ACT nasal spray Place 1 spray into both nostrils daily. (Patient taking differently: Place 1 spray into both nostrils daily as needed for allergies.) 18.2 mL 2   insulin glargine (LANTUS) 100 UNIT/ML injection Inject 30 Units into the skin at bedtime.     levothyroxine (SYNTHROID) 175 MCG tablet Take 175 mcg by mouth daily before breakfast.     losartan (COZAAR) 50 MG tablet Take 1 tablet (50 mg total) by mouth daily. 90 tablet 3   metFORMIN (GLUCOPHAGE) 500 MG tablet Take 500-1,000 mg by mouth 2 (two) times daily with a meal. Take 1 tablet (500 mg) by mouth in the morning & take 2 tablets (1000 mg) by mouth in the evening.     Multiple Vitamins-Minerals (MEGA MULTIVITAMIN FOR MEN PO) Take 1 tablet by mouth daily.     OXYGEN Inhale 2 L into the lungs continuous.     predniSONE (DELTASONE) 5 MG tablet TAKE 1 TABLET(5 MG) BY MOUTH DAILY WITH BREAKFAST (Patient taking differently: Take 5 mg by mouth daily with breakfast.) 90 tablet 3   pregabalin (LYRICA) 75 MG capsule Take 1 capsule (75 mg total) by mouth 2 (two) times daily.     Semaglutide (OZEMPIC, 1 MG/DOSE, [place]) Inject 1 mg into the skin every Friday.     tamsulosin (FLOMAX) 0.4 MG CAPS  capsule Take 1 capsule (0.4 mg total) by mouth at bedtime. 30 capsule 0   Tiotropium Bromide-Olodaterol (STIOLTO RESPIMAT) 2.5-2.5 MCG/ACT AERS Inhale 2 puffs into the lungs daily. 4 g 0   No current facility-administered medications for this visit.    PHYSICAL EXAMINATION: ECOG PERFORMANCE STATUS: {CHL ONC ECOG [phone]}  There were no vitals filed for this visit. There were no vitals filed for this visit.  BREAST:*** No palpable masses or nodules in either right or left breasts. No palpable axillary supraclavicular or infraclavicular adenopathy no breast tenderness or nipple  discharge. (exam performed in the presence of a chaperone)  LABORATORY DATA:  I have reviewed the data as listed    Latest Ref Rng & Units 03/13/2022    7:36 AM 10/24/2021    8:58 AM 10/16/2021   10:47 AM  CMP  Glucose 70 - 99 mg/dL 89  83  621   BUN 8 - 23 mg/dL 30  18  20    Creatinine 0.61 - 1.24 mg/dL  3.08  6.57   Sodium 135 - 145 mmol/L 142  144  140   Potassium 3.5 - 5.1 mmol/L 4.4  3.9  4.8   Chloride 98 - 111 mmol/L 104  107  101   CO2 20 - 29 mmol/L   19   Calcium 8.6 - 10.2 mg/dL   9.5     Lab Results  Component Value Date   WBC 5.9 03/27/2022   HGB 12.1 (L) 03/27/2022   HCT 38.3 (L) 03/27/2022   MCV 82.2 03/27/2022   PLT 85 (L) 03/27/2022   NEUTROABS 4.5 03/27/2022    ASSESSMENT & PLAN:  No problem-specific Assessment & Plan notes found for this encounter.    No orders of the defined types were placed in this encounter.  The patient has a good understanding of the overall plan. he agrees with it. he will call with any problems that may develop before the next visit here. Total time spent: 30 mins including face to face time and time spent for planning, charting and co-ordination of care   03/29/2022, CMA 06/24/22    I 06/26/22 am scribing for Dr. Janan Ridge  ***

## 2022-06-26 ENCOUNTER — Inpatient Hospital Stay: Payer: Medicare PPO | Attending: Hematology and Oncology | Admitting: Hematology and Oncology

## 2022-06-26 ENCOUNTER — Other Ambulatory Visit: Payer: Self-pay

## 2022-06-26 ENCOUNTER — Inpatient Hospital Stay: Payer: Medicare PPO

## 2022-06-26 VITALS — BP 158/62 | HR 61 | Temp 97.7°F | Resp 18 | Ht 74.0 in | Wt 200.3 lb

## 2022-06-26 DIAGNOSIS — Z7952 Long term (current) use of systemic steroids: Secondary | ICD-10-CM | POA: Insufficient documentation

## 2022-06-26 DIAGNOSIS — D693 Immune thrombocytopenic purpura: Secondary | ICD-10-CM | POA: Diagnosis not present

## 2022-06-26 LAB — CBC WITH DIFFERENTIAL (CANCER CENTER ONLY)
Abs Immature Granulocytes: 0.03 10*3/uL (ref 0.00–0.07)
Basophils Absolute: 0 10*3/uL (ref 0.0–0.1)
Basophils Relative: 0 %
Eosinophils Absolute: 0.1 10*3/uL (ref 0.0–0.5)
Eosinophils Relative: 1 %
HCT: 40.2 % (ref 39.0–52.0)
Hemoglobin: 12.6 g/dL — ABNORMAL LOW (ref 13.0–17.0)
Immature Granulocytes: 0 %
Lymphocytes Relative: 13 %
Lymphs Abs: 0.9 10*3/uL (ref 0.7–4.0)
MCH: 26.5 pg (ref 26.0–34.0)
MCHC: 31.3 g/dL (ref 30.0–36.0)
MCV: 84.6 fL (ref 80.0–100.0)
Monocytes Absolute: 0.4 10*3/uL (ref 0.1–1.0)
Monocytes Relative: 6 %
Neutro Abs: 5.5 10*3/uL (ref 1.7–7.7)
Neutrophils Relative %: 80 %
Platelet Count: 86 10*3/uL — ABNORMAL LOW (ref 150–400)
RBC: 4.75 MIL/uL (ref 4.22–5.81)
RDW: 15.3 % (ref 11.5–15.5)
WBC Count: 6.9 10*3/uL (ref 4.0–10.5)
nRBC: 0 % (ref 0.0–0.2)

## 2022-06-26 NOTE — Assessment & Plan Note (Signed)
Relapsed acute ITP Prior treatment: Prednisone started 07/05/2019 completed 10/23/2019 Lab review:  02/08/2020: Platelet count 37 with blood with expectoration started on prednisone 02/15/2020: Platelet count 98 03/19/2020: Platelet count: 68 04/16/2020: Platelet count 64 (prednisone dose lowered to 5 mg) 06/25/2020: Platelets 38 07/09/2020: Platelets 73 (continue with 5 mg prednisone) 08/26/2020: Platelets 73 3/22: Platelets 67 (prednisone discontinued) 11/01/2020: Platelets 37 (prednisone 5 mg restarted) 11/29/2020: Platelets 68 03/28/2021: Platelets 54 08/29/2021: Platelets 101 03/27/2022: Platelets 85 06/26/2022:   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  He has a stent plan for the right leg coming up.   Based on my discussions with Dr. Vassie Loll, we decided that he should stay on 5 mg of prednisone indefinitely. Return to clinic in 3 months with labs and follow-up

## 2022-06-27 ENCOUNTER — Ambulatory Visit: Payer: Medicare PPO | Admitting: Adult Health

## 2022-06-27 ENCOUNTER — Ambulatory Visit (INDEPENDENT_AMBULATORY_CARE_PROVIDER_SITE_OTHER): Payer: Medicare PPO

## 2022-06-27 ENCOUNTER — Encounter: Payer: Self-pay | Admitting: Adult Health

## 2022-06-27 VITALS — BP 160/70 | HR 71 | Temp 97.9°F | Ht 74.0 in | Wt 198.6 lb

## 2022-06-27 DIAGNOSIS — Z950 Presence of cardiac pacemaker: Secondary | ICD-10-CM

## 2022-06-27 DIAGNOSIS — J9611 Chronic respiratory failure with hypoxia: Secondary | ICD-10-CM

## 2022-06-27 DIAGNOSIS — G4733 Obstructive sleep apnea (adult) (pediatric): Secondary | ICD-10-CM

## 2022-06-27 DIAGNOSIS — R918 Other nonspecific abnormal finding of lung field: Secondary | ICD-10-CM

## 2022-06-27 DIAGNOSIS — I255 Ischemic cardiomyopathy: Secondary | ICD-10-CM

## 2022-06-27 DIAGNOSIS — J432 Centrilobular emphysema: Secondary | ICD-10-CM

## 2022-06-27 MED ORDER — ALBUTEROL SULFATE HFA 108 (90 BASE) MCG/ACT IN AERS: 2.0000 | INHALATION_SPRAY | Freq: Four times a day (QID) | RESPIRATORY_TRACT | 3 refills | Status: DC | PRN

## 2022-06-27 NOTE — Assessment & Plan Note (Signed)
Compensated on CPAP   Plan  Patient Instructions  Continue on Stiolto 2 puffs daily .  Mucinex DM Twice daily  As needed  Cough/congestion .  Continue on Oxygen 2l/m rest and 3l/m with activity .  Continue on CPAP At bedtime wear with Naps.  Activity as tolerated.  Saline nasal rinses Twice daily   Saline nasal gel At bedtime .  Prevnar 20 vaccine today  Consider RSV vaccine next month .  Follow up in 4 Months  with Dr. Vassie Loll or Samiya Mervin NP and As needed   Please contact office for sooner follow up if symptoms do not improve or worsen or seek emergency care

## 2022-06-27 NOTE — Assessment & Plan Note (Signed)
Compensated on Oxygen   Plan  Patient Instructions  Continue on Stiolto 2 puffs daily .  Mucinex DM Twice daily  As needed  Cough/congestion .  Continue on Oxygen 2l/m rest and 3l/m with activity .  Continue on CPAP At bedtime wear with Naps.  Activity as tolerated.  Saline nasal rinses Twice daily   Saline nasal gel At bedtime .  Prevnar 20 vaccine today  Consider RSV vaccine next month .  Follow up in 4 Months  with Dr. Vassie Loll or Aubrina Nieman NP and As needed   Please contact office for sooner follow up if symptoms do not improve or worsen or seek emergency care

## 2022-06-27 NOTE — Progress Notes (Signed)
@Patient  ID: [name], male    DOB: 1941-05-13, 81 y.o.   MRN: [id number]  Chief Complaint  Patient presents with   Follow-up    Referring provider: [name], *  HPI: 81 year old male former smoker followed for very severe COPD, chronic hypoxic respiratory failure on oxygen, obstructive sleep apnea on nocturnal CPAP.  History of lung nodules followed on serial CT chest. Medical history significant for rheumatoid arthritis not on maintenance medication, diabetes, congestive heart failure, coronary artery disease status post RCA stent, chronic kidney disease, A-fib with previous use of amiodarone that was stopped in August 2019. 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 PFT 10/2015 FEV1 was 36%, ratio 46, FVC 58%, DLCO 26%     CT chest 12/2016 -masslike consolidation in LUL   PET scan [id number] >+metabolic act in LUL consolidation    CT chest 08/2017 >> 2.4 x 3.5 cm triangular subpleural opacity in the anterior left upper lobe > has resolved the platelike scarring bilateral nodular infiltrates stable,   CT chest 09/2020 severe emphysema, extensive pleural calcification.  Previous nodule in the right  lower lobe has significantly decreased in size measuring 0.8 x 0.8 cm previously at 2.3 cm.  A stable left upper lobe cavitary nodule measuring 4 mm.  And a stable irregular nodule in the left lower lobe measuring 1.5 cm, and a left lower lobe nodule measuring 1.5 cm  CT chest October 22, 2021 that showed a stable uncomplicated aneurysmal dilatation along the aortic arch.  Interval increase of the abdominal aorta from 33 mm to 36 mm.,  Advanced emphysema.  Stable chronic extensive pleural calcifications, no worrisome pulmonary nodules.  Ill-defined left lower lobe pulmonary nodules unchanged since October 2020. Extensive cholelithiasis  06/27/2022 Follow up : COPD, OSA , O2 RF  Patient presents for a 74-month follow-up.  Patient has underlying severe COPD that is oxygen dependent.  Overall he says he is doing well.  He has had no flare of his cough or wheezing patient has not been on antibiotics or steroids any this year.  He says he is trying to stay active.  He is able to do his own shopping remains independent and lives at home alone.  He does his own cleaning and old very light yard work.  He gets winded with heavy activities but will stop and rest and then is able to continue with his activity.  He has a daughter that lives locally.  Patient does use a four-point cane due to balance issues. He remains on Stiolto daily.  Has had no increased albuterol use.  Remains on oxygen 2 L at rest and 3 L with activity Patient uses a CPAP every single night.  Says he  cannot sleep without it.  Feels rested with perceived benefit.  Download shows 100% compliance daily average usage at 8.5 hours.  Patient is on auto CPAP 10 to 15 cm H2O.  AHI is 1.1/hour    No Known Allergies  Immunization History  Administered Date(s) Administered   COVID-19, mRNA, vaccine(Comirnaty)12 years and older 05/03/2022   Fluad Quad(high Dose 65+) 04/26/2019, 06/08/2020, 05/01/2021   Influenza Split 04/14/2011, 04/27/2013, 03/28/2014    Influenza Whole 05/28/2009, 04/27/2010, 03/31/2012   Influenza, High Dose Seasonal PF 04/10/2010, 04/11/2011, 04/19/2013, 03/21/2015, 04/05/2016, 05/16/2017, 03/28/2018, 04/08/2018   Influenza,inj,Quad PF,6+ Mos 03/17/2014, 04/04/2016   Influenza-Unspecified 04/30/2006, 04/28/2007, 04/28/2008, 05/29/2009, 04/10/2010, 05/26/2011, 05/07/2012, 05/28/2020, 05/01/2021   PFIZER Comirnaty(Gray Top)Covid-19 Tri-Sucrose Vaccine 11/29/2020, 05/01/2021   PFIZER(Purple Top)SARS-COV-2 Vaccination 08/19/2019, 09/09/2019, 03/15/2020, 04/08/2020, 12/09/2020   Pfizer Covid-19 Vaccine Bivalent Booster 60yrs & up 05/01/2021   Pneumococcal Conjugate-13 09/20/2013, 03/29/2015   Pneumococcal Polysaccharide-23 05/28/2008, 04/25/2010, 04/19/2013   Pneumococcal-Unspecified 06/26/2006, 04/27/2013   Td 11/23/2009   Tdap 07/17/2011   Zoster, Live 04/28/2009, 11/08/2009    Past Medical History:  Diagnosis Date   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   ablated   Anxiety    Aortic aneurysm (HCC) 09/02/2017   BENIGN PROSTATIC HYPERTROPHY 11/23/2009   Cardiomyopathy (HCC) 08/28/2016   Chronic systolic CHF (congestive heart failure) (HCC) 09/02/2017   COPD (chronic obstructive pulmonary disease) (HCC)    CORONARY ARTERY DISEASE 11/23/2009   DECREASED HEARING, 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   DEGENERATIVE JOINT DISEASE 11/23/2009   DEPRESSION 11/23/2009   FATIGUE 11/23/2009   GAIT DISTURBANCE 12/10/2009   HEMOPTYSIS UNSPECIFIED 05/07/2010   High cholesterol    HYPERTENSION 07/30/2009   HYPOTHYROIDISM 07/30/2009   Ischemic cardiomyopathy 09/02/2017   LUMBAR RADICULOPATHY, 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   On home oxygen therapy    "2-3L; 24/7" (09/10/2016)   OSA on CPAP    Pneumonia    PTSD (post-traumatic stress disorder) 03/10/2012   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   RA (rheumatoid arthritis) (HCC) 06/11/2011   "qwhere" (09/10/2016)   RESPIRATORY FAILURE, CHRONIC 07/31/2009   Scleritis of both eyes 03/17/2014   Thrombocytopenia (HCC)    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    Type II diabetes mellitus (HCC)     Tobacco History: Social History   Tobacco Use  Smoking Status Former   Packs/day: 2.50   Years: 40.00   Total pack years: 100.00   Types: Cigarettes, Pipe, Cigars   Quit date: 07/28/1998   Years since quitting: 23.9  Smokeless Tobacco Never   Counseling given: Not Answered   Outpatient Medications Prior to Visit  Medication Sig Dispense Refill   albuterol (PROVENTIL) (2.5 MG/3ML) 0.083% nebulizer solution Take 3 mLs (2.5 mg total) by nebulization every 6 (six) hours as needed for wheezing or shortness of breath. 120 mL 12   atorvastatin (LIPITOR) 40 MG tablet Take 1 tablet (40 mg total) by mouth daily. 90 tablet 3   Carboxymethylcellul-Glycerin (LUBRICATING EYE DROPS OP) Place 1 drop into both eyes 4 (four) times daily as needed (dry eyes).      Cholecalciferol (VITAMIN D3) 2000 units capsule Take 2,000 Units by mouth daily.      clopidogrel (PLAVIX) 75 MG tablet Take 1 tablet (75 mg total) by mouth daily. 30 tablet 11   Continuous Blood Gluc Transmit (DEXCOM G6 TRANSMITTER) MISC by Does not apply route.     empagliflozin (JARDIANCE) 10 MG TABS tablet Take 5 mg by mouth daily. Filled at [place].     ferrous sulfate 324 (65 Fe) MG TBEC Take 1 tablet  by mouth daily.     fluticasone (FLONASE) 50 MCG/ACT nasal spray Place 1 spray into both nostrils daily. (Patient taking differently: Place 1 spray into both nostrils daily as needed for allergies.) 18.2 mL 2   insulin glargine (LANTUS) 100 UNIT/ML injection Inject 30 Units into the skin at bedtime.     levothyroxine (SYNTHROID) 175 MCG tablet Take 175 mcg by mouth daily before breakfast.     losartan (COZAAR) 50 MG tablet Take 1 tablet (50 mg total) by mouth daily. 90 tablet 3   metFORMIN (GLUCOPHAGE) 500 MG tablet Take 500-1,000 mg by mouth 2 (two) times daily with a meal. Take 1 tablet (500 mg) by mouth in the morning & take 2 tablets (1000 mg) by mouth in the evening.     Multiple Vitamins-Minerals (MEGA  MULTIVITAMIN FOR MEN PO) Take 1 tablet by mouth daily.     OXYGEN Inhale 2 L into the lungs continuous.     predniSONE (DELTASONE) 5 MG tablet TAKE 1 TABLET(5 MG) BY MOUTH DAILY WITH BREAKFAST (Patient taking differently: Take 5 mg by mouth daily with breakfast.) 90 tablet 3   pregabalin (LYRICA) 75 MG capsule Take 1 capsule (75 mg total) by mouth 2 (two) times daily.     Semaglutide (OZEMPIC, 1 MG/DOSE, [place]) Inject 1 mg into the skin every Friday.     tamsulosin (FLOMAX) 0.4 MG CAPS capsule Take 1 capsule (0.4 mg total) by mouth at bedtime. 30 capsule 0   Tiotropium Bromide-Olodaterol (STIOLTO RESPIMAT) 2.5-2.5 MCG/ACT AERS Inhale 2 puffs into the lungs daily. 4 g 0   albuterol (VENTOLIN HFA) 108 (90 Base) MCG/ACT inhaler Inhale 2 puffs into the lungs every 6 (six) hours as needed for wheezing or shortness of breath. 8 g 6   acetaminophen (TYLENOL) 500 MG tablet Take 1,000 mg by mouth every 6 (six) hours as needed for mild pain. (Patient not taking: Reported on 06/27/2022)     No facility-administered medications prior to visit.     Review of Systems:   Constitutional:   No  weight loss, night sweats,  Fevers, chills, + fatigue, or  lassitude.  HEENT:   No headaches,  Difficulty swallowing,  Tooth/dental problems, or  Sore throat,                No sneezing, itching, ear ache, nasal congestion, post nasal drip,   CV:  No chest pain,  Orthopnea, PND, +swelling in lower extremities, no anasarca, dizziness, palpitations, syncope.   GI  No heartburn, indigestion, abdominal pain, nausea, vomiting, diarrhea, change in bowel habits, loss of appetite, bloody stools.   Resp: .  No chest wall deformity  Skin: no rash or lesions.  GU: no dysuria, change in color of urine, no urgency or frequency.  No flank pain, no hematuria   MS:  No joint pain or swelling.  No decreased range of motion.  No back pain.    Physical Exam  BP (!) 160/70 (BP Location: Left Arm, Patient Position: Sitting, Cuff  Size: Normal)   Pulse 71   Temp 97.9 F (36.6 C)   Ht 6\' 2"  (1.88 m)   Wt 198 lb 9.6 oz (90.1 kg)   SpO2 93% Comment: 3 l  BMI 25.50 kg/m   GEN: A/Ox3; pleasant , NAD, well nourished , On O2    HEENT:  Pulcifer/AT,   NOSE-clear, THROAT-clear, no lesions, no postnasal drip or exudate noted.   NECK:  Supple w/ fair ROM; no JVD; normal carotid impulses  w/o bruits; no thyromegaly or nodules palpated; no lymphadenopathy.    RESP  Clear  P & A; w/o, wheezes/ rales/ or rhonchi. no accessory muscle use, no dullness to percussion  CARD:  RRR, no m/r/g, 1+ peripheral edema, pulses intact, no cyanosis or clubbing.  GI:   Soft & nt; nml bowel sounds; no organomegaly or masses detected.   Musco: Warm bil, no deformities or joint swelling noted.   Neuro: alert, no focal deficits noted.    Skin: Warm, no lesions or rashes    Lab Results:  CBC   BMET   BNP No results found for: "BNP"    Imaging: No results found.       Latest Ref Rng & Units 11/16/2015    9:55 AM 12/30/2013    9:45 AM  PFT Results  FVC-Pre L 2.96  3.01   FVC-Predicted Pre % 59  59   FVC-Post L 2.89  3.01   FVC-Predicted Post % 58  59   Pre FEV1/FVC % % 43  45   Post FEV1/FCV % % 46  49   FEV1-Pre L 1.28  1.36   FEV1-Predicted Pre % 35  36   FEV1-Post L 1.33  1.48   DLCO uncorrected ml/min/mmHg 9.88  10.13   DLCO UNC% % 26  26   DLCO corrected ml/min/mmHg 9.94    DLCO COR %Predicted % 26    DLVA Predicted % 40  39   TLC L  6.31   TLC % Predicted %  80   RV % Predicted %  80     No results found for: "NITRICOXIDE"      Assessment & Plan:   COPD (chronic obstructive pulmonary disease) (HCC) Compensated on present regimen  Plan  Patient Instructions  Continue on Stiolto 2 puffs daily .  Mucinex DM Twice daily  As needed  Cough/congestion .  Continue on Oxygen 2l/m rest and 3l/m with activity .  Continue on CPAP At bedtime wear with Naps.  Activity as tolerated.  Saline nasal rinses Twice  daily   Saline nasal gel At bedtime .  Prevnar 20 vaccine today  Consider RSV vaccine next month .  Follow up in 4 Months  with Dr. Elsworth Soho or Timoteo Carreiro NP and As needed   Please contact office for sooner follow up if symptoms do not improve or worsen or seek emergency care     Chronic respiratory failure with hypoxia ([place]) Compensated on Oxygen   Plan  Patient Instructions  Continue on Stiolto 2 puffs daily .  Mucinex DM Twice daily  As needed  Cough/congestion .  Continue on Oxygen 2l/m rest and 3l/m with activity .  Continue on CPAP At bedtime wear with Naps.  Activity as tolerated.  Saline nasal rinses Twice daily   Saline nasal gel At bedtime .  Prevnar 20 vaccine today  Consider RSV vaccine next month .  Follow up in 4 Months  with Dr. Elsworth Soho or Constant Mandeville NP and As needed   Please contact office for sooner follow up if symptoms do not improve or worsen or seek emergency care     OSA on CPAP Compensated on CPAP   Plan  Patient Instructions  Continue on Stiolto 2 puffs daily .  Mucinex DM Twice daily  As needed  Cough/congestion .  Continue on Oxygen 2l/m rest and 3l/m with activity .  Continue on CPAP At bedtime wear with Naps.  Activity as tolerated.  Saline nasal rinses Twice daily  Saline nasal gel At bedtime .  Prevnar 20 vaccine today  Consider RSV vaccine next month .  Follow up in 4 Months  with Dr. Elsworth Soho or Any Mcneice NP and As needed   Please contact office for sooner follow up if symptoms do not improve or worsen or seek emergency care     Lung nodules Stable on CT.  Continue with serial CT due in March or 2024     Rexene Edison, NP 06/27/2022

## 2022-06-27 NOTE — Assessment & Plan Note (Signed)
Stable on CT.  Continue with serial CT due in March or 2024

## 2022-06-27 NOTE — Assessment & Plan Note (Signed)
Compensated on present regimen  Plan  Patient Instructions  Continue on Stiolto 2 puffs daily .  Mucinex DM Twice daily  As needed  Cough/congestion .  Continue on Oxygen 2l/m rest and 3l/m with activity .  Continue on CPAP At bedtime wear with Naps.  Activity as tolerated.  Saline nasal rinses Twice daily   Saline nasal gel At bedtime .  Prevnar 20 vaccine today  Consider RSV vaccine next month .  Follow up in 4 Months  with Dr. Vassie Loll or Azalya Galyon NP and As needed   Please contact office for sooner follow up if symptoms do not improve or worsen or seek emergency care

## 2022-06-27 NOTE — Patient Instructions (Addendum)
Continue on Stiolto 2 puffs daily .  Mucinex DM Twice daily  As needed  Cough/congestion .  Continue on Oxygen 2l/m rest and 3l/m with activity .  Continue on CPAP At bedtime wear with Naps.  Activity as tolerated.  Saline nasal rinses Twice daily   Saline nasal gel At bedtime .  Prevnar 20 vaccine today  Consider RSV vaccine next month .  Follow up in 4 Months  with Dr. Vassie Loll or Dorethy Tomey NP and As needed   Please contact office for sooner follow up if symptoms do not improve or worsen or seek emergency care

## 2022-06-28 LAB — CUP PACEART REMOTE DEVICE CHECK
Battery Remaining Longevity: 123 mo
Battery Voltage: 3.01 V
Brady Statistic AP VP Percent: 45.21 %
Brady Statistic AP VS Percent: 1.46 %
Brady Statistic AS VP Percent: 25.21 %
Brady Statistic AS VS Percent: 28.13 %
Brady Statistic RA Percent Paced: 47.41 %
Brady Statistic RV Percent Paced: 70.41 %
Date Time Interrogation Session: 20231201042800
Implantable Lead Connection Status: 753985
Implantable Lead Connection Status: 753985
Implantable Lead Implant Date: 20201204
Implantable Lead Implant Date: 20201204
Implantable Lead Location: 753859
Implantable Lead Location: 753860
Implantable Lead Model: 5076
Implantable Lead Model: 5076
Implantable Pulse Generator Implant Date: 20201204
Lead Channel Impedance Value: 342 Ohm
Lead Channel Impedance Value: 361 Ohm
Lead Channel Impedance Value: 399 Ohm
Lead Channel Impedance Value: 399 Ohm
Lead Channel Pacing Threshold Amplitude: 0.5 V
Lead Channel Pacing Threshold Amplitude: 0.625 V
Lead Channel Pacing Threshold Pulse Width: 0.4 ms
Lead Channel Pacing Threshold Pulse Width: 0.4 ms
Lead Channel Sensing Intrinsic Amplitude: 2.5 mV
Lead Channel Sensing Intrinsic Amplitude: 2.5 mV
Lead Channel Sensing Intrinsic Amplitude: 7.125 mV
Lead Channel Sensing Intrinsic Amplitude: 7.125 mV
Lead Channel Setting Pacing Amplitude: 1.5 V
Lead Channel Setting Pacing Amplitude: 2.5 V
Lead Channel Setting Pacing Pulse Width: 0.4 ms
Lead Channel Setting Sensing Sensitivity: 2 mV
Zone Setting Status: 755011
Zone Setting Status: 755011

## 2022-07-15 NOTE — Progress Notes (Signed)
Remote pacemaker transmission.   

## 2022-08-06 ENCOUNTER — Telehealth: Payer: Self-pay | Admitting: Internal Medicine

## 2022-08-06 ENCOUNTER — Other Ambulatory Visit: Payer: Self-pay

## 2022-08-06 DIAGNOSIS — Z794 Long term (current) use of insulin: Secondary | ICD-10-CM

## 2022-08-06 DIAGNOSIS — E118 Type 2 diabetes mellitus with unspecified complications: Secondary | ICD-10-CM

## 2022-08-06 MED ORDER — DEXCOM G7 SENSOR MISC: 4 refills | Status: DC

## 2022-08-06 MED ORDER — DEXCOM G7 RECEIVER DEVI: 0 refills | Status: DC

## 2022-08-06 NOTE — Telephone Encounter (Signed)
Patient called and said that he needs a new script for G7 Glucose Monitor sent into [facility], The same place it was sent last year. I scheduled him for a follow up on next available 08/15/22. He asked for a call to let him know when its sent in at [phone]

## 2022-08-06 NOTE — Telephone Encounter (Signed)
Spoke with the pt and was able to send the Glucose monitor to the pharmacy stated.

## 2022-08-06 NOTE — Telephone Encounter (Signed)
Okay to refill meter in the meantime

## 2022-08-15 ENCOUNTER — Ambulatory Visit (INDEPENDENT_AMBULATORY_CARE_PROVIDER_SITE_OTHER): Payer: Medicare PPO | Admitting: Internal Medicine

## 2022-08-15 ENCOUNTER — Encounter: Payer: Self-pay | Admitting: Internal Medicine

## 2022-08-15 VITALS — BP 180/80 | HR 63 | Temp 98.0°F | Ht 74.0 in | Wt 199.4 lb

## 2022-08-15 DIAGNOSIS — E118 Type 2 diabetes mellitus with unspecified complications: Secondary | ICD-10-CM | POA: Diagnosis not present

## 2022-08-15 DIAGNOSIS — E1169 Type 2 diabetes mellitus with other specified complication: Secondary | ICD-10-CM | POA: Diagnosis not present

## 2022-08-15 DIAGNOSIS — M069 Rheumatoid arthritis, unspecified: Secondary | ICD-10-CM

## 2022-08-15 DIAGNOSIS — J9611 Chronic respiratory failure with hypoxia: Secondary | ICD-10-CM | POA: Diagnosis not present

## 2022-08-15 DIAGNOSIS — I739 Peripheral vascular disease, unspecified: Secondary | ICD-10-CM

## 2022-08-15 DIAGNOSIS — I10 Essential (primary) hypertension: Secondary | ICD-10-CM | POA: Diagnosis not present

## 2022-08-15 DIAGNOSIS — I480 Paroxysmal atrial fibrillation: Secondary | ICD-10-CM

## 2022-08-15 DIAGNOSIS — J432 Centrilobular emphysema: Secondary | ICD-10-CM

## 2022-08-15 DIAGNOSIS — D696 Thrombocytopenia, unspecified: Secondary | ICD-10-CM

## 2022-08-15 DIAGNOSIS — Z0001 Encounter for general adult medical examination with abnormal findings: Secondary | ICD-10-CM | POA: Diagnosis not present

## 2022-08-15 DIAGNOSIS — I25709 Atherosclerosis of coronary artery bypass graft(s), unspecified, with unspecified angina pectoris: Secondary | ICD-10-CM | POA: Insufficient documentation

## 2022-08-15 DIAGNOSIS — E785 Hyperlipidemia, unspecified: Secondary | ICD-10-CM

## 2022-08-15 DIAGNOSIS — Z794 Long term (current) use of insulin: Secondary | ICD-10-CM

## 2022-08-15 DIAGNOSIS — I5022 Chronic systolic (congestive) heart failure: Secondary | ICD-10-CM

## 2022-08-15 NOTE — Assessment & Plan Note (Signed)
Needs to stay on 2L O2 at all times. Uses concentrator.

## 2022-08-15 NOTE — Assessment & Plan Note (Signed)
Stable symptoms of SOB on exertion. Taking plavix and statin daily. Continue without change.

## 2022-08-15 NOTE — Assessment & Plan Note (Signed)
[facility] Foot exam done. Taking metformin and jardiance and ozempic. On ARB and statin.

## 2022-08-15 NOTE — Assessment & Plan Note (Signed)
Labs overall stable and [facility] with [facility].

## 2022-08-15 NOTE — Assessment & Plan Note (Signed)
Overall stable and controlled.

## 2022-08-15 NOTE — Assessment & Plan Note (Signed)
Flu shot up to date. Covid-19 up to date. Pneumonia complete. Shingrix complete. Tetanus due 2034. Colonoscopy aged out. Counseled about sun safety and mole surveillance. Counseled about the dangers of distracted driving. Given 10 year screening recommendations.

## 2022-08-15 NOTE — Assessment & Plan Note (Signed)
Gets labs through [place] and is taking lipitor 40 mg daily and will continue.

## 2022-08-15 NOTE — Progress Notes (Signed)
   Subjective:   Patient ID: [name], male    DOB: May 15, 1941, 82 y.o.   MRN: [id number]  HPI The patient is here for physical.  PMH, [facility], social history reviewed and updated  Review of Systems  Constitutional: Negative.   HENT: Negative.    Eyes: Negative.   Respiratory:  Positive for shortness of breath. Negative for cough and chest tightness.   Cardiovascular:  Negative for chest pain, palpitations and leg swelling.  Gastrointestinal:  Negative for abdominal distention, abdominal pain, constipation, diarrhea, nausea and vomiting.  Musculoskeletal: Negative.   Skin: Negative.   Neurological: Negative.   Psychiatric/Behavioral: Negative.      Objective:  Physical Exam Constitutional:      Appearance: He is well-developed.  HENT:     Head: Normocephalic and atraumatic.  Cardiovascular:     Rate and Rhythm: Normal rate and regular rhythm.  Pulmonary:     Effort: Pulmonary effort is normal. No respiratory distress.     Breath sounds: Normal breath sounds. No wheezing or rales.     Comments: Oxygen nasal cannula Abdominal:     General: Bowel sounds are normal. There is no distension.     Palpations: Abdomen is soft.     Tenderness: There is no abdominal tenderness. There is no rebound.  Musculoskeletal:     Cervical back: Normal range of motion.  Skin:    General: Skin is warm and dry.  Neurological:     Mental Status: He is alert and oriented to person, place, and time.     Coordination: Coordination normal.     Vitals:   08/15/22 0943 08/15/22 0949  BP: (!) 180/80 (!) 180/80  Pulse: 63   Temp: 98 F (36.7 C)   TempSrc: Oral   SpO2: 94%   Weight: 199 lb 6 oz (90.4 kg)   Height: 6\' 2"  (1.88 m)     Assessment & Plan:

## 2022-08-15 NOTE — Assessment & Plan Note (Signed)
BP mildly elevated today but at goal at home and at recent visits at [facility]. Continue losartan 50 mg daily and adjust as needed. [facility]

## 2022-08-15 NOTE — Assessment & Plan Note (Signed)
Just starting arava has not noticed difference yet.

## 2022-08-15 NOTE — Assessment & Plan Note (Signed)
Not on NOAC due to thrombocytopenia and bleeding in the past. Rate controlled sounds regular today but no EKG done.

## 2022-08-18 ENCOUNTER — Encounter: Payer: Self-pay | Admitting: Internal Medicine

## 2022-08-18 NOTE — Telephone Encounter (Signed)
Called patient and was unable to leave a voicemail due to them not having one set up.

## 2022-08-18 NOTE — Telephone Encounter (Signed)
Patient called back and states that the code has not been corrected in his script for the dexcom and that no one called him back today.  Please call patient

## 2022-08-18 NOTE — Telephone Encounter (Signed)
Patient called back and said he needs to give some info for this and asked for a call back as soon as possible

## 2022-08-19 NOTE — Telephone Encounter (Signed)
Spoke with the pt and was able to get the information to the pharmacy so I can call and see what all is needed in order for pt to receive his device.  **I informed pt as soon as I have some down time and I am not in clinic I will call the pharmacy.

## 2022-08-20 ENCOUNTER — Other Ambulatory Visit: Payer: Self-pay

## 2022-08-20 DIAGNOSIS — E1169 Type 2 diabetes mellitus with other specified complication: Secondary | ICD-10-CM

## 2022-08-20 DIAGNOSIS — E118 Type 2 diabetes mellitus with unspecified complications: Secondary | ICD-10-CM

## 2022-08-20 MED ORDER — DEXCOM G7 RECEIVER DEVI: 0 refills | Status: DC

## 2022-08-20 MED ORDER — DEXCOM G7 SENSOR MISC: 4 refills | Status: DC

## 2022-08-20 NOTE — Telephone Encounter (Signed)
I was able to speak with the pt and inform him that the DX was attached and written in the pharmacy note and that the rx was sent back to pharmacy to be filled and delivered.

## 2022-08-20 NOTE — Telephone Encounter (Signed)
Confirmed with Verdis Frederickson patient has given her the number to call for his dexcom

## 2022-08-26 NOTE — Telephone Encounter (Signed)
PT calls back today following up on their request for the Dexcom 7 and supplies. PT was informed by [place] that they had still not received the proper documentation on their end. I informed PT that we had sent out the forms on 01/24. On PT's portal for this supplier it also still shows as "held due to lack of required documents". I let PT know I would get this back to Dr.Crawford and staff.  CB for PT: [phone]  [facility] for [facility]: [phone]

## 2022-08-27 ENCOUNTER — Other Ambulatory Visit: Payer: Self-pay | Admitting: Vascular Surgery

## 2022-08-29 DIAGNOSIS — E785 Hyperlipidemia, unspecified: Secondary | ICD-10-CM | POA: Diagnosis not present

## 2022-08-29 DIAGNOSIS — E119 Type 2 diabetes mellitus without complications: Secondary | ICD-10-CM | POA: Diagnosis not present

## 2022-08-29 NOTE — Telephone Encounter (Signed)
I have spoken with the pt and he has stated that [place] has stated they have not gotten the updated rx with DX codes attached.  **I was able to contact [place] pt support at [phone] to get clarity on what all was needed for pt to receive is device.  with [facility] support Documentation dept. [place] has stated that the device will be sent to the pt and he should receive this device by next week.  **Pt has paid for express shipping and I was able to inform [place] of this matter.  **I was able to inform pt of the above information as well and provide him with the pt support number so he can keep track of the order as well. Office notes have been faxed over to [phone].

## 2022-09-19 ENCOUNTER — Ambulatory Visit: Payer: Medicare PPO | Admitting: Pulmonary Disease

## 2022-09-19 ENCOUNTER — Ambulatory Visit (INDEPENDENT_AMBULATORY_CARE_PROVIDER_SITE_OTHER): Payer: Medicare PPO

## 2022-09-19 ENCOUNTER — Encounter: Payer: Self-pay | Admitting: Pulmonary Disease

## 2022-09-19 ENCOUNTER — Other Ambulatory Visit: Payer: Self-pay | Admitting: Pulmonary Disease

## 2022-09-19 VITALS — BP 154/82 | HR 65 | Temp 97.6°F | Ht 74.0 in | Wt 196.6 lb

## 2022-09-19 DIAGNOSIS — J9611 Chronic respiratory failure with hypoxia: Secondary | ICD-10-CM

## 2022-09-19 DIAGNOSIS — S2232XA Fracture of one rib, left side, initial encounter for closed fracture: Secondary | ICD-10-CM

## 2022-09-19 DIAGNOSIS — Z043 Encounter for examination and observation following other accident: Secondary | ICD-10-CM | POA: Diagnosis not present

## 2022-09-19 DIAGNOSIS — J432 Centrilobular emphysema: Secondary | ICD-10-CM

## 2022-09-19 NOTE — Progress Notes (Signed)
Synopsis: Followed by [facility] for severe COPD, emphysema, OSA on CPAP, pulmonary nodules.  Subjective:   PATIENT ID: [name] GENDER: male DOB: February 04, 1941, MRN: [id number]   HPI  Chief Complaint  Patient presents with   Acute Visit    Had a fall at home 09/15/22 when cleaning his carpet- landed on left side "took my breath away".  He has CP and left arm pain (8/10)- esp worse if he coughs and when he moves.     [name] comes to clinic today because he fell earlier this week when he was cleaning the floor and he fell on the left side.  He landed with his arm against his ribs.  This was on Monday.  Ever since then he has had nonresolving left-sided chest pain.  No change in breathing.  No change in mucus production.  However, it does hurt to breathe and it hurts to cough.  He is not using any splinting techniques.  He has been taking Tylenol.  Past Medical History:  Diagnosis Date   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   ablated   Anxiety    Aortic aneurysm ([place]) 09/02/2017   BENIGN PROSTATIC HYPERTROPHY 11/23/2009   Cardiomyopathy ([place]) [id number]   Chronic systolic CHF (congestive heart failure) ([place]) 09/02/2017   COPD (chronic obstructive pulmonary disease) ([place])    CORONARY ARTERY DISEASE 11/23/2009   DECREASED HEARING, 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   DEGENERATIVE JOINT DISEASE 11/23/2009   DEPRESSION 11/23/2009   FATIGUE 11/23/2009   GAIT DISTURBANCE 12/10/2009   HEMOPTYSIS UNSPECIFIED 05/07/2010   High cholesterol    HYPERTENSION 07/30/2009   HYPOTHYROIDISM 07/30/2009   Ischemic cardiomyopathy 09/02/2017   LUMBAR RADICULOPATHY, 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   On home oxygen therapy    "2-3L; 24/7" (09/10/2016)   OSA on CPAP    Pneumonia    PTSD (post-traumatic stress disorder) 03/10/2012   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   RA (rheumatoid arthritis) ([place]) 06/11/2011   "qwhere" (09/10/2016)   RESPIRATORY FAILURE, CHRONIC 07/31/2009   Scleritis of both eyes 03/17/2014   Thrombocytopenia ([place])     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   Type II diabetes mellitus ([place])      Family History  Problem Relation Age of Onset   Other Mother        gun shot     Social History   Socioeconomic History   Marital status: Widowed    Spouse name: Not on file   Number of children: Not on file   Years of education: Not on file   Highest education level: Not on file  Occupational History   Occupation: disabled veteran, Ex [employer]    Employer: RETIRED  Tobacco Use   Smoking status: Former    Packs/day: 2.50    Years: 40.00    Total pack years: 100.00    Types: Cigarettes, Pipe, Cigars    Quit date: 07/28/1998    Years since quitting: 24.1   Smokeless tobacco: Never  Vaping Use   Vaping Use: Never used  Substance and Sexual Activity   Alcohol use: No    Alcohol/week: 0.0 standard drinks of alcohol   Drug use: No   Sexual activity: Not Currently  Other Topics Concern   Not on file  Social History Narrative   Lives in the home with his wife   Sees [place] every 6 month for meds.   Denies asbestos exposure   Social Determinants of Health   Financial Resource Strain: Low Risk  (01/17/2022)   Overall Emergency planning/management officer Strain (  CARDIA)    Difficulty of Paying Living Expenses: Not hard at all  Food Insecurity: No Food Insecurity (03/05/2022)   Hunger Vital Sign    Worried About Running Out of Food in the Last Year: Never true    Ran Out of Food in the Last Year: Never true  Transportation Needs: No Transportation Needs (03/05/2022)   PRAPARE - Hydrologist (Medical): No    Lack of Transportation (Non-Medical): No  Physical Activity: Inactive (01/17/2022)   Exercise Vital Sign    Days of Exercise per Week: 0 days    Minutes of Exercise per Session: 0 min  Stress: No Stress Concern Present (01/17/2022)   [place]    Feeling of Stress : Not at all  Social Connections: Socially Isolated (01/17/2022)   Social  Connection and Isolation Panel [NHANES]    Frequency of Communication with Friends and Family: More than three times a week    Frequency of Social Gatherings with Friends and Family: Once a week    Attends Religious Services: Never    Marine scientist or Organizations: No    Attends Archivist Meetings: Never    Marital Status: Widowed  Intimate Partner Violence: Not At Risk (01/17/2022)   Humiliation, Afraid, Rape, and Kick questionnaire    Fear of Current or Ex-Partner: No    Emotionally Abused: No    Physically Abused: No    Sexually Abused: No     No Known Allergies   Outpatient Medications Prior to Visit  Medication Sig Dispense Refill   acetaminophen (TYLENOL) 500 MG tablet Take 1,000 mg by mouth every 6 (six) hours as needed for mild pain.     albuterol (PROVENTIL) (2.5 MG/3ML) 0.083% nebulizer solution Take 3 mLs (2.5 mg total) by nebulization every 6 (six) hours as needed for wheezing or shortness of breath. 120 mL 12   albuterol (VENTOLIN HFA) 108 (90 Base) MCG/ACT inhaler Inhale 2 puffs into the lungs every 6 (six) hours as needed for wheezing or shortness of breath. 18 g 3   atorvastatin (LIPITOR) 40 MG tablet Take 1 tablet (40 mg total) by mouth daily. 90 tablet 3   Carboxymethylcellul-Glycerin (LUBRICATING EYE DROPS OP) Place 1 drop into both eyes 4 (four) times daily as needed (dry eyes).      Cholecalciferol (VITAMIN D3) 2000 units capsule Take 2,000 Units by mouth daily.      clopidogrel (PLAVIX) 75 MG tablet TAKE 1 TABLET(75 MG) BY MOUTH DAILY 30 tablet 11   Continuous Blood Gluc Receiver (DEXCOM G7 RECEIVER) DEVI Use to check blood sugars 3x a day. DX CODE: E11.69, E78.5 1 each 0   Continuous Blood Gluc Sensor (DEXCOM G7 SENSOR) MISC Use to check blood sugars 3x a day. DX CODE: E11.69, E78.5 3 each 4   Continuous Blood Gluc Transmit (DEXCOM G6 TRANSMITTER) MISC by Does not apply route.     cyanocobalamin (VITAMIN B12) 500 MCG tablet TAKE ONE TABLET BY  MOUTH DAILY FOR LOW B12 LEVEL     empagliflozin (JARDIANCE) 10 MG TABS tablet Take 5 mg by mouth daily. Filled at [place].     ferrous sulfate 324 (65 Fe) MG TBEC Take 1 tablet by mouth daily.     fluticasone (FLONASE) 50 MCG/ACT nasal spray Place 1 spray into both nostrils daily. (Patient taking differently: Place 1 spray into both nostrils daily as needed for allergies.) 18.2 mL 2   insulin  glargine (LANTUS) 100 UNIT/ML injection Inject 30 Units into the skin at bedtime.     leflunomide (ARAVA) 10 MG tablet TAKE ONE TABLET BY MOUTH DAILY FOR RHEUMATOID ARTHRITIS     levothyroxine (SYNTHROID) 175 MCG tablet Take 175 mcg by mouth daily before breakfast.     losartan (COZAAR) 50 MG tablet Take 1 tablet (50 mg total) by mouth daily. 90 tablet 3   metFORMIN (GLUCOPHAGE) 500 MG tablet Take 500-1,000 mg by mouth 2 (two) times daily with a meal. Take 1 tablet (500 mg) by mouth in the morning & take 2 tablets (1000 mg) by mouth in the evening.     Multiple Vitamins-Minerals (MEGA MULTIVITAMIN FOR MEN PO) Take 1 tablet by mouth daily.     OXYGEN Inhale 2 L into the lungs continuous.     predniSONE (DELTASONE) 5 MG tablet TAKE 1 TABLET(5 MG) BY MOUTH DAILY WITH BREAKFAST (Patient taking differently: Take 5 mg by mouth daily with breakfast.) 90 tablet 3   pregabalin (LYRICA) 75 MG capsule Take 1 capsule (75 mg total) by mouth 2 (two) times daily.     Semaglutide (OZEMPIC, 1 MG/DOSE, [place]) Inject 1 mg into the skin every Friday.     tamsulosin (FLOMAX) 0.4 MG CAPS capsule Take 1 capsule (0.4 mg total) by mouth at bedtime. 30 capsule 0   Tiotropium Bromide-Olodaterol (STIOLTO RESPIMAT) 2.5-2.5 MCG/ACT AERS Inhale 2 puffs into the lungs daily. 4 g 0   No facility-administered medications prior to visit.    Review of Systems  Constitutional:  Negative for chills, fever, malaise/fatigue and weight loss.  HENT:  Negative for congestion, sinus pain and sore throat.   Respiratory:  Positive for cough, sputum  production and shortness of breath.   Cardiovascular:  Negative for chest pain and leg swelling.      Objective:  Physical Exam   Vitals:   09/19/22 1143  BP: (!) 154/82  Pulse: 65  Temp: 97.6 F (36.4 C)  TempSrc: Oral  SpO2: 94%  Weight: 196 lb 9.6 oz (89.2 kg)  Height: '6\' 2"'$  (1.88 m)    Gen: chronically ill appearing HENT: OP clear, neck supple PULM: Poor air movement B, normal effort  CV: RRR, no mgr GI: BS+, soft, nontender Derm: bruising mid-axillary line Psyche: normal mood and affect   CBC    Component Value Date/Time   WBC 6.9 06/26/2022 1029   WBC 8.7 11/14/2020 0945   RBC 4.75 06/26/2022 1029   HGB 12.6 (L) 06/26/2022 1029   HGB 12.0 (L) 06/28/2019 1453   HCT 40.2 06/26/2022 1029   HCT 38.4 06/28/2019 1453   PLT 86 (L) 06/26/2022 1029   PLT 44 (LL) 06/28/2019 1453   MCV 84.6 06/26/2022 1029   MCV 89 06/28/2019 1453   MCH 26.5 06/26/2022 1029   MCHC 31.3 06/26/2022 1029   RDW 15.3 06/26/2022 1029   RDW 13.8 06/28/2019 1453   LYMPHSABS 0.9 06/26/2022 1029   LYMPHSABS 1.2 12/23/2016 1053   MONOABS 0.4 06/26/2022 1029   EOSABS 0.1 06/26/2022 1029   EOSABS 0.3 12/23/2016 1053   BASOSABS 0.0 06/26/2022 1029   BASOSABS 0.0 12/23/2016 1053     PSG 04/2014 showed severe OSA, AHI 48/h with nadir desatn 78% correctd by CPAP 12 cm, 3 L O2 blended in , c flex +2 cm, humidity, A medium full face mask was used. 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 PFT 10/2015 FEV1 was 36%, ratio 46, FVC 58%, DLCO 26%     CT chest 12/2016 -masslike consolidation in LUL   PET scan [id number] >+metabolic act in LUL consolidation    CT chest 08/2017 >> 2.4 x 3.5 cm triangular subpleural opacity in the anterior left upper lobe > has resolved the platelike scarring bilateral  nodular infiltrates stable,   CT chest 09/2020 severe emphysema, extensive pleural calcification.  Previous nodule in the right lower lobe has significantly decreased in size measuring 0.8 x 0.8 cm previously at 2.3 cm.  A stable left upper lobe cavitary nodule measuring 4 mm.  And a stable irregular nodule in the left lower lobe measuring 1.5 cm, and a left lower lobe nodule measuring 1.5 cm   CT chest October 22, 2021 that showed a stable uncomplicated aneurysmal dilatation along the aortic arch.  Interval increase of the abdominal aorta from 33 mm to 36 mm.,  Advanced emphysema.  Stable chronic extensive pleural calcifications, no worrisome pulmonary nodules.  Ill-defined left lower lobe pulmonary nodules unchanged since October 2020. Extensive cholelithiasis       Assessment & Plan:   Centrilobular emphysema (HCC)  Chronic respiratory failure with hypoxia (HCC)  Closed fracture of one rib of left side, initial encounter  Discussion: Acute left rib fracture, no evidence of pneumothorax based on my review of the chest x-ray.  No immediate pulmonary complication.  Plan: Left rib fracture: Use ibuprofen 400 mg every 4-6 hours as needed for pain for the next 3 to 4 days Use the splinting technique we reviewed today in clinic with a pillow for coughing If you develop worsening shortness of breath or mucus production let us know  Severe COPD: Continue Stiolto 2 puffs daily no matter how you feel Use albuterol as needed for chest tightness wheezing or shortness of breath  Chronic respiratory failure with hypoxemia: Continue using oxygen as you are doing  Keep follow-up appointment with Tammy Parrett as previously arranged  Immunizations: Immunization History  Administered Date(s) Administered   COVID-19, mRNA, vaccine(Comirnaty)12 years and older 05/03/2022   Fluad Quad(high Dose 65+) 04/26/2019, 06/08/2020, 05/01/2021   Influenza Split 04/14/2011, 04/27/2013, 03/28/2014    Influenza Whole 05/28/2009, 04/27/2010, 03/31/2012   Influenza, High Dose Seasonal PF 04/10/2010, 04/11/2011, 04/19/2013, 03/21/2015, 04/05/2016, 05/16/2017, 03/28/2018, 04/08/2018   Influenza,inj,Quad PF,6+ Mos 03/17/2014, 04/04/2016   Influenza-Unspecified 04/30/2006, 04/28/2007, 04/28/2008, 05/29/2009, 04/10/2010, 05/26/2011, 05/07/2012, 05/28/2020, 05/01/2021, 04/27/2022   PFIZER Comirnaty(Gray Top)Covid-19 Tri-Sucrose Vaccine 11/29/2020, 05/01/2021   PFIZER(Purple Top)SARS-COV-2 Vaccination 08/19/2019, 09/09/2019, 03/15/2020, 04/08/2020, 12/09/2020   PNEUMOCOCCAL CONJUGATE-20 06/27/2022   Pfizer Covid-19 Vaccine Bivalent Booster 60yr & up 05/01/2021   Pneumococcal Conjugate-13 09/20/2013, 03/29/2015   Pneumococcal Polysaccharide-23 05/28/2008, 04/25/2010, 04/19/2013   Pneumococcal-Unspecified 06/26/2006, 04/27/2013   Rsv, Bivalent, Protein Subunit Rsvpref,pf ([place] 07/30/2022   Td 11/23/2009   Tdap 07/17/2011, 07/30/2022   Zoster, Live 04/28/2009, 11/08/2009     Current Outpatient Medications:    acetaminophen (TYLENOL) 500 MG tablet, Take 1,000 mg by mouth every 6 (six) hours as needed for mild pain., Disp: , Rfl:    albuterol (PROVENTIL) (2.5 MG/3ML) 0.083% nebulizer solution, Take 3 mLs (2.5 mg total) by nebulization every 6 (six) hours as needed for wheezing or shortness of breath., Disp: 120 mL, Rfl: 12   albuterol (VENTOLIN HFA) 108 (90 Base) MCG/ACT inhaler, Inhale 2 puffs into the lungs every 6 (six) hours as needed  for wheezing or shortness of breath., Disp: 18 g, Rfl: 3   atorvastatin (LIPITOR) 40 MG tablet, Take 1 tablet (40 mg total) by mouth daily., Disp: 90 tablet, Rfl: 3   Carboxymethylcellul-Glycerin (LUBRICATING EYE DROPS OP), Place 1 drop into both eyes 4 (four) times daily as needed (dry eyes). , Disp: , Rfl:    Cholecalciferol (VITAMIN D3) 2000 units capsule, Take 2,000 Units by mouth daily. , Disp: , Rfl:    clopidogrel (PLAVIX) 75 MG tablet, TAKE 1 TABLET(75  MG) BY MOUTH DAILY, Disp: 30 tablet, Rfl: 11   Continuous Blood Gluc Receiver (DEXCOM G7 RECEIVER) DEVI, Use to check blood sugars 3x a day. DX CODE: E11.69, E78.5, Disp: 1 each, Rfl: 0   Continuous Blood Gluc Sensor (DEXCOM G7 SENSOR) MISC, Use to check blood sugars 3x a day. DX CODE: E11.69, E78.5, Disp: 3 each, Rfl: 4   Continuous Blood Gluc Transmit (DEXCOM G6 TRANSMITTER) MISC, by Does not apply route., Disp: , Rfl:    cyanocobalamin (VITAMIN B12) 500 MCG tablet, TAKE ONE TABLET BY MOUTH DAILY FOR LOW B12 LEVEL, Disp: , Rfl:    empagliflozin (JARDIANCE) 10 MG TABS tablet, Take 5 mg by mouth daily. Filled at [facility]., Disp: , Rfl:    ferrous sulfate 324 (65 Fe) MG TBEC, Take 1 tablet by mouth daily., Disp: , Rfl:    fluticasone (FLONASE) 50 MCG/ACT nasal spray, Place 1 spray into both nostrils daily. (Patient taking differently: Place 1 spray into both nostrils daily as needed for allergies.), Disp: 18.2 mL, Rfl: 2   insulin glargine (LANTUS) 100 UNIT/ML injection, Inject 30 Units into the skin at bedtime., Disp: , Rfl:    leflunomide (ARAVA) 10 MG tablet, TAKE ONE TABLET BY MOUTH DAILY FOR RHEUMATOID ARTHRITIS, Disp: , Rfl:    levothyroxine (SYNTHROID) 175 MCG tablet, Take 175 mcg by mouth daily before breakfast., Disp: , Rfl:    losartan (COZAAR) 50 MG tablet, Take 1 tablet (50 mg total) by mouth daily., Disp: 90 tablet, Rfl: 3   metFORMIN (GLUCOPHAGE) 500 MG tablet, Take 500-1,000 mg by mouth 2 (two) times daily with a meal. Take 1 tablet (500 mg) by mouth in the morning & take 2 tablets (1000 mg) by mouth in the evening., Disp: , Rfl:    Multiple Vitamins-Minerals (MEGA MULTIVITAMIN FOR MEN PO), Take 1 tablet by mouth daily., Disp: , Rfl:    OXYGEN, Inhale 2 L into the lungs continuous., Disp: , Rfl:    predniSONE (DELTASONE) 5 MG tablet, TAKE 1 TABLET(5 MG) BY MOUTH DAILY WITH BREAKFAST (Patient taking differently: Take 5 mg by mouth daily with breakfast.), Disp: 90 tablet, Rfl: 3   pregabalin  (LYRICA) 75 MG capsule, Take 1 capsule (75 mg total) by mouth 2 (two) times daily., Disp: , Rfl:    Semaglutide (OZEMPIC, 1 MG/DOSE, [place]), Inject 1 mg into the skin every Friday., Disp: , Rfl:    tamsulosin (FLOMAX) 0.4 MG CAPS capsule, Take 1 capsule (0.4 mg total) by mouth at bedtime., Disp: 30 capsule, Rfl: 0   Tiotropium Bromide-Olodaterol (STIOLTO RESPIMAT) 2.5-2.5 MCG/ACT AERS, Inhale 2 puffs into the lungs daily., Disp: 4 g, Rfl: 0

## 2022-09-19 NOTE — Patient Instructions (Addendum)
Left rib fracture: Use ibuprofen 400 mg every 4-6 hours as needed for pain for the next 3 to 4 days Use the splinting technique we reviewed today in clinic with a pillow for coughing If you develop worsening shortness of breath or mucus production let us know  Severe COPD: Continue Stiolto 2 puffs daily no matter how you feel Use albuterol as needed for chest tightness wheezing or shortness of breath  Chronic respiratory failure with hypoxemia: Continue using oxygen as you are doing  Keep follow-up appointment with Tammy Parrett as previously arranged

## 2022-09-25 ENCOUNTER — Inpatient Hospital Stay: Payer: Medicare PPO | Admitting: Hematology and Oncology

## 2022-09-25 ENCOUNTER — Inpatient Hospital Stay: Payer: Medicare PPO

## 2022-09-26 ENCOUNTER — Ambulatory Visit: Payer: Medicare PPO

## 2022-09-26 DIAGNOSIS — I255 Ischemic cardiomyopathy: Secondary | ICD-10-CM | POA: Diagnosis not present

## 2022-09-26 LAB — CUP PACEART REMOTE DEVICE CHECK
Battery Remaining Longevity: 119 mo
Battery Voltage: 3.01 V
Brady Statistic AP VP Percent: 50.99 %
Brady Statistic AP VS Percent: 1.64 %
Brady Statistic AS VP Percent: 19.24 %
Brady Statistic AS VS Percent: 28.13 %
Brady Statistic RA Percent Paced: 53.81 %
Brady Statistic RV Percent Paced: 70.23 %
Date Time Interrogation Session: 20240301042645
Implantable Lead Connection Status: 753985
Implantable Lead Connection Status: 753985
Implantable Lead Implant Date: 20201204
Implantable Lead Implant Date: 20201204
Implantable Lead Location: 753859
Implantable Lead Location: 753860
Implantable Lead Model: 5076
Implantable Lead Model: 5076
Implantable Pulse Generator Implant Date: 20201204
Lead Channel Impedance Value: 323 Ohm
Lead Channel Impedance Value: 342 Ohm
Lead Channel Impedance Value: 399 Ohm
Lead Channel Impedance Value: 399 Ohm
Lead Channel Pacing Threshold Amplitude: 0.5 V
Lead Channel Pacing Threshold Amplitude: 0.5 V
Lead Channel Pacing Threshold Pulse Width: 0.4 ms
Lead Channel Pacing Threshold Pulse Width: 0.4 ms
Lead Channel Sensing Intrinsic Amplitude: 2.5 mV
Lead Channel Sensing Intrinsic Amplitude: 2.5 mV
Lead Channel Sensing Intrinsic Amplitude: 7.75 mV
Lead Channel Sensing Intrinsic Amplitude: 7.75 mV
Lead Channel Setting Pacing Amplitude: 1.5 V
Lead Channel Setting Pacing Amplitude: 2.5 V
Lead Channel Setting Pacing Pulse Width: 0.4 ms
Lead Channel Setting Sensing Sensitivity: 2 mV
Zone Setting Status: 755011
Zone Setting Status: 755011

## 2022-09-29 ENCOUNTER — Other Ambulatory Visit: Payer: Self-pay | Admitting: *Deleted

## 2022-09-29 DIAGNOSIS — I712 Thoracic aortic aneurysm, without rupture, unspecified: Secondary | ICD-10-CM

## 2022-09-29 DIAGNOSIS — Z9889 Other specified postprocedural states: Secondary | ICD-10-CM

## 2022-09-29 DIAGNOSIS — I724 Aneurysm of artery of lower extremity: Secondary | ICD-10-CM

## 2022-10-07 NOTE — Progress Notes (Signed)
Patient Care Team: [name], MD as PCP - General (Internal Medicine) Stanford Breed Denice Bors, MD as PCP - Cardiology (Cardiology) Clent Jacks, MD as Consulting Physician (Ophthalmology) Brand Males, MD as Consulting Physician (Pulmonary Disease) System, Provider Not In as Consulting Physician Lahoma Rocker, MD as Consulting Physician (Rheumatology) Rigoberto Noel, MD as Consulting Physician (Pulmonary Disease) Lelon Perla, MD as Consulting Physician (Cardiology)  DIAGNOSIS:  Encounter Diagnosis  Name Primary?   Acute ITP (HCC) Yes    CHIEF COMPLIANT:  Follow-up of acute ITP    INTERVAL HISTORY: [name] is a  82 y.o. 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 He presents to the clinic today for follow-up.  He is tolerating prednisone extremely well.  Unfortunately had fallen recently and broke his ribs.  He is still sore from that fall.  Because of this he has been FSF back in his energy levels.  Prior to that he was doing quite well.  Does not have any bleeding or bruising symptoms.   ALLERGIES:  has No Known Allergies.  MEDICATIONS:  Current Outpatient Medications  Medication Sig Dispense Refill   acetaminophen (TYLENOL) 500 MG tablet Take 1,000 mg by mouth every 6 (six) hours as needed for mild pain.     albuterol (PROVENTIL) (2.5 MG/3ML) 0.083% nebulizer solution Take 3 mLs (2.5 mg total) by nebulization every 6 (six) hours as needed for wheezing or shortness of breath. 120 mL 12   albuterol (VENTOLIN HFA) 108 (90 Base) MCG/ACT inhaler Inhale 2 puffs into the lungs every 6 (six) hours as needed for wheezing or shortness of breath. 18 g 3   atorvastatin (LIPITOR) 40 MG tablet Take 1 tablet (40 mg total) by mouth daily. 90 tablet 3   Carboxymethylcellul-Glycerin (LUBRICATING EYE DROPS OP) Place 1 drop into both eyes 4 (four) times daily as needed (dry eyes).      Cholecalciferol (VITAMIN D3) 2000 units  capsule Take 2,000 Units by mouth daily.      clopidogrel (PLAVIX) 75 MG tablet TAKE 1 TABLET(75 MG) BY MOUTH DAILY 30 tablet 11   Continuous Blood Gluc Receiver (DEXCOM G7 RECEIVER) DEVI Use to check blood sugars 3x a day. DX CODE: E11.69, E78.5 1 each 0   Continuous Blood Gluc Sensor (DEXCOM G7 SENSOR) MISC Use to check blood sugars 3x a day. DX CODE: E11.69, E78.5 3 each 4   Continuous Blood Gluc Transmit (DEXCOM G6 TRANSMITTER) MISC by Does not apply route.     cyanocobalamin (VITAMIN B12) 500 MCG tablet TAKE ONE TABLET BY MOUTH DAILY FOR LOW B12 LEVEL     empagliflozin (JARDIANCE) 10 MG TABS tablet Take 5 mg by mouth daily. Filled at [place].     ferrous sulfate 324 (65 Fe) MG TBEC Take 1 tablet by mouth daily.     fluticasone (FLONASE) 50 MCG/ACT nasal spray Place 1 spray into both nostrils daily. (Patient taking differently: Place 1 spray into both nostrils daily as needed for allergies.) 18.2 mL 2   insulin glargine (LANTUS) 100 UNIT/ML injection Inject 30 Units into the skin at bedtime.     leflunomide (ARAVA) 10 MG tablet TAKE ONE TABLET BY MOUTH DAILY FOR RHEUMATOID ARTHRITIS     levothyroxine (SYNTHROID) 175 MCG tablet Take 175 mcg by mouth daily before breakfast.     losartan (COZAAR) 50 MG tablet Take 1 tablet (50 mg total) by mouth daily. 90 tablet 3   metFORMIN (GLUCOPHAGE) 500 MG tablet Take  500-1,000 mg by mouth 2 (two) times daily with a meal. Take 1 tablet (500 mg) by mouth in the morning & take 2 tablets (1000 mg) by mouth in the evening.     Multiple Vitamins-Minerals (MEGA MULTIVITAMIN FOR MEN PO) Take 1 tablet by mouth daily.     OXYGEN Inhale 2 L into the lungs continuous.     predniSONE (DELTASONE) 5 MG tablet TAKE 1 TABLET(5 MG) BY MOUTH DAILY WITH BREAKFAST (Patient taking differently: Take 5 mg by mouth daily with breakfast.) 90 tablet 3   pregabalin (LYRICA) 75 MG capsule Take 1 capsule (75 mg total) by mouth 2 (two) times daily.     Semaglutide (OZEMPIC, 1 MG/DOSE, [place])  Inject 1 mg into the skin every Friday.     tamsulosin (FLOMAX) 0.4 MG CAPS capsule Take 1 capsule (0.4 mg total) by mouth at bedtime. 30 capsule 0   Tiotropium Bromide-Olodaterol (STIOLTO RESPIMAT) 2.5-2.5 MCG/ACT AERS Inhale 2 puffs into the lungs daily. 4 g 0   No current facility-administered medications for this visit.    PHYSICAL EXAMINATION: ECOG PERFORMANCE STATUS: 1 - Symptomatic but completely ambulatory  Vitals:   10/15/22 0831  BP: (!) 143/56  Pulse: 82  Resp: 19  Temp: 97.7 F (36.5 C)  SpO2: 90%   Filed Weights   10/15/22 0831  Weight: 196 lb 1.6 oz (89 kg)      LABORATORY DATA:  I have reviewed the data as listed    Latest Ref Rng & Units 03/13/2022    7:36 AM 10/24/2021    8:58 AM 10/16/2021   10:47 AM  CMP  Glucose 70 - 99 mg/dL 89  83  128   BUN 8 - 23 mg/dL 30  18  20    Creatinine 0.61 - 1.24 mg/dL 1.20  1.20  1.10   Sodium 135 - 145 mmol/L 142  144  140   Potassium 3.5 - 5.1 mmol/L 4.4  3.9  4.8   Chloride 98 - 111 mmol/L 104  107  101   CO2 20 - 29 mmol/L   19   Calcium 8.6 - 10.2 mg/dL   9.5     Lab Results  Component Value Date   WBC 6.7 10/15/2022   HGB 13.1 10/15/2022   HCT 41.6 10/15/2022   MCV 85.4 10/15/2022   PLT 87 (L) 10/15/2022   NEUTROABS 4.7 10/15/2022    ASSESSMENT & PLAN:  Acute ITP ([place]) Relapsed acute ITP Prior treatment: Prednisone started 07/05/2019 completed 10/23/2019 Lab review:  02/08/2020: Platelet count 37 with blood with expectoration started on prednisone 02/15/2020: Platelet count 98 03/19/2020: Platelet count: 68 04/16/2020: Platelet count 64 (prednisone dose lowered to 5 mg) 06/25/2020: Platelets 38 07/09/2020: Platelets 73 (continue with 5 mg prednisone) 08/26/2020: Platelets 73 3/22: Platelets 67 (prednisone discontinued) 11/01/2020: Platelets 37 (prednisone 5 mg restarted) 11/29/2020: Platelets 68 03/28/2021: Platelets 54 08/29/2021: Platelets 101 03/27/2022: Platelets 85 06/26/2022: Platelets 86 10/15/2022:  Platelets 87 stable   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  He has a stent plan for the right leg coming up.  Continue with 5 mg of prednisone Recheck labs in 3 months and follow-up    Orders Placed This Encounter  Procedures   CBC with Differential ([place] Only)    Standing Status:   Future    Standing Expiration Date:   10/15/2023   The patient has a good understanding of the overall plan. he agrees with it.  he will call with any problems that may develop before the next visit here. Total time spent: 30 mins including face to face time and time spent for planning, charting and co-ordination of care   Harriette Ohara, MD 10/15/22    I Gardiner Coins am acting as a Education administrator for [employer]  I have reviewed the above documentation for accuracy and completeness, and I agree with the above.

## 2022-10-15 ENCOUNTER — Inpatient Hospital Stay: Payer: Medicare PPO | Admitting: Hematology and Oncology

## 2022-10-15 ENCOUNTER — Inpatient Hospital Stay: Payer: Medicare PPO | Attending: Hematology and Oncology

## 2022-10-15 VITALS — BP 143/56 | HR 82 | Temp 97.7°F | Resp 19 | Ht 74.0 in | Wt 196.1 lb

## 2022-10-15 DIAGNOSIS — D693 Immune thrombocytopenic purpura: Secondary | ICD-10-CM

## 2022-10-15 DIAGNOSIS — Z7952 Long term (current) use of systemic steroids: Secondary | ICD-10-CM | POA: Insufficient documentation

## 2022-10-15 LAB — CBC WITH DIFFERENTIAL (CANCER CENTER ONLY)
Abs Immature Granulocytes: 0.02 10*3/uL (ref 0.00–0.07)
Basophils Absolute: 0.1 10*3/uL (ref 0.0–0.1)
Basophils Relative: 1 %
Eosinophils Absolute: 0.1 10*3/uL (ref 0.0–0.5)
Eosinophils Relative: 2 %
HCT: 41.6 % (ref 39.0–52.0)
Hemoglobin: 13.1 g/dL (ref 13.0–17.0)
Immature Granulocytes: 0 %
Lymphocytes Relative: 16 %
Lymphs Abs: 1.1 10*3/uL (ref 0.7–4.0)
MCH: 26.9 pg (ref 26.0–34.0)
MCHC: 31.5 g/dL (ref 30.0–36.0)
MCV: 85.4 fL (ref 80.0–100.0)
Monocytes Absolute: 0.7 10*3/uL (ref 0.1–1.0)
Monocytes Relative: 11 %
Neutro Abs: 4.7 10*3/uL (ref 1.7–7.7)
Neutrophils Relative %: 70 %
Platelet Count: 87 10*3/uL — ABNORMAL LOW (ref 150–400)
RBC: 4.87 MIL/uL (ref 4.22–5.81)
RDW: 16.1 % — ABNORMAL HIGH (ref 11.5–15.5)
WBC Count: 6.7 10*3/uL (ref 4.0–10.5)
nRBC: 0 % (ref 0.0–0.2)

## 2022-10-15 NOTE — Assessment & Plan Note (Signed)
Relapsed acute ITP Prior treatment: Prednisone started 07/05/2019 completed 10/23/2019 Lab review:  02/08/2020: Platelet count 37 with blood with expectoration started on prednisone 02/15/2020: Platelet count 98 03/19/2020: Platelet count: 68 04/16/2020: Platelet count 64 (prednisone dose lowered to 5 mg) 06/25/2020: Platelets 38 07/09/2020: Platelets 73 (continue with 5 mg prednisone) 08/26/2020: Platelets 73 3/22: Platelets 67 (prednisone discontinued) 11/01/2020: Platelets 37 (prednisone 5 mg restarted) 11/29/2020: Platelets 68 03/28/2021: Platelets 54 08/29/2021: Platelets 101 03/27/2022: Platelets 85 06/26/2022: Platelets 86 10/15/2022: Platelets 87 stable   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  He has a stent plan for the right leg coming up.  Continue with 5 mg of prednisone Recheck labs in 3 months and follow-up

## 2022-10-16 ENCOUNTER — Ambulatory Visit (HOSPITAL_BASED_OUTPATIENT_CLINIC_OR_DEPARTMENT_OTHER): Payer: Medicare PPO

## 2022-10-16 ENCOUNTER — Ambulatory Visit (HOSPITAL_BASED_OUTPATIENT_CLINIC_OR_DEPARTMENT_OTHER)
Admission: RE | Admit: 2022-10-16 | Discharge: 2022-10-16 | Disposition: A | Discharge status: Home / Self Care | Payer: Medicare PPO | Source: Ambulatory Visit | Attending: Cardiology | Admitting: Cardiology

## 2022-10-16 DIAGNOSIS — S2242XA Multiple fractures of ribs, left side, initial encounter for closed fracture: Secondary | ICD-10-CM | POA: Diagnosis not present

## 2022-10-16 DIAGNOSIS — I712 Thoracic aortic aneurysm, without rupture, unspecified: Secondary | ICD-10-CM | POA: Diagnosis not present

## 2022-10-16 DIAGNOSIS — I724 Aneurysm of artery of lower extremity: Secondary | ICD-10-CM | POA: Insufficient documentation

## 2022-10-16 DIAGNOSIS — I700 Atherosclerosis of aorta: Secondary | ICD-10-CM | POA: Diagnosis not present

## 2022-10-16 DIAGNOSIS — Z9889 Other specified postprocedural states: Secondary | ICD-10-CM | POA: Diagnosis not present

## 2022-10-16 MED ORDER — IOHEXOL 350 MG/ML SOLN
100.0000 mL | Freq: Once | INTRAVENOUS | Status: AC | PRN
  Administered 2022-10-16: 100 mL via INTRAVENOUS

## 2022-10-20 NOTE — Progress Notes (Signed)
Remote pacemaker transmission.   

## 2022-10-27 ENCOUNTER — Ambulatory Visit: Payer: Medicare PPO | Admitting: Adult Health

## 2022-10-27 ENCOUNTER — Encounter: Payer: Self-pay | Admitting: Adult Health

## 2022-10-27 VITALS — BP 154/78 | HR 77 | Temp 98.1°F | Ht 74.0 in | Wt 197.0 lb

## 2022-10-27 DIAGNOSIS — J301 Allergic rhinitis due to pollen: Secondary | ICD-10-CM | POA: Diagnosis not present

## 2022-10-27 DIAGNOSIS — R918 Other nonspecific abnormal finding of lung field: Secondary | ICD-10-CM

## 2022-10-27 DIAGNOSIS — S2249XA Multiple fractures of ribs, unspecified side, initial encounter for closed fracture: Secondary | ICD-10-CM | POA: Insufficient documentation

## 2022-10-27 DIAGNOSIS — J309 Allergic rhinitis, unspecified: Secondary | ICD-10-CM | POA: Insufficient documentation

## 2022-10-27 DIAGNOSIS — J9611 Chronic respiratory failure with hypoxia: Secondary | ICD-10-CM

## 2022-10-27 DIAGNOSIS — S2242XD Multiple fractures of ribs, left side, subsequent encounter for fracture with routine healing: Secondary | ICD-10-CM

## 2022-10-27 DIAGNOSIS — I5022 Chronic systolic (congestive) heart failure: Secondary | ICD-10-CM

## 2022-10-27 DIAGNOSIS — G4733 Obstructive sleep apnea (adult) (pediatric): Secondary | ICD-10-CM | POA: Diagnosis not present

## 2022-10-27 DIAGNOSIS — J432 Centrilobular emphysema: Secondary | ICD-10-CM | POA: Diagnosis not present

## 2022-10-27 NOTE — Assessment & Plan Note (Signed)
Excellent control and compliance on nocturnal CPAP 

## 2022-10-27 NOTE — Assessment & Plan Note (Signed)
Compensated on oxygen to keep O2 saturations greater than 88 to 90%.

## 2022-10-27 NOTE — Assessment & Plan Note (Signed)
Mild flare.  Continue with Allegra and Flonase as needed

## 2022-10-27 NOTE — Assessment & Plan Note (Signed)
COPD compensated on present regimen.  Hold on additional PFTs at this time.  If has clinical change can consider repeat testing.  Recent CT chest showed stable scarring and emphysema.  Immunizations are up-to-date  Plan  Patient Instructions  Continue on Stiolto 2 puffs daily .  Albuterol inhaler /nebulizer As needed   Mucinex DM Twice daily  As needed  Cough/congestion .  Continue on Oxygen 2l/m rest and 3l/m with activity .  Allegra daily As needed   Flonase daily As needed   Continue on CPAP At bedtime wear with Naps.  Activity as tolerated.  Saline nasal rinses Twice daily   Saline nasal gel At bedtime .  Follow up in 4 Months  with Dr. Elsworth Soho or Ausar Georgiou NP and As needed   Please contact office for sooner follow up if symptoms do not improve or worsen or seek emergency care

## 2022-10-27 NOTE — Progress Notes (Signed)
@Patient  ID: [name], male    DOB: April 12, 1941, 82 y.o.   MRN: [id number]  Chief Complaint  Patient presents with   Follow-up    Referring provider: [name], *  HPI: 82 year old male former smoker followed for very severe COPD, chronic hypoxic respiratory failure on oxygen, obstructive sleep apnea on nocturnal CPAP.  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 PFT 10/2015 FEV1 was 36%, ratio 46, FVC 58%, DLCO 26%    CT chest 12/2016 -masslike consolidation in LUL   PET scan [id number] >+metabolic act in LUL consolidation    CT chest 08/2017 >> 2.4 x 3.5 cm triangular subpleural opacity in the anterior left upper lobe > has resolved the platelike scarring bilateral nodular infiltrates stable,   CT chest 09/2020 severe emphysema, extensive pleural calcification.  Previous nodule in the right lower  lobe has significantly decreased in size measuring 0.8 x 0.8 cm previously at 2.3 cm.  A stable left upper lobe cavitary nodule measuring 4 mm.  And a stable irregular nodule in the left lower lobe measuring 1.5 cm, and a left lower lobe nodule measuring 1.5 cm   CT chest October 22, 2021 that showed a stable uncomplicated aneurysmal dilatation along the aortic arch.  Interval increase of the abdominal aorta from 33 mm to 36 mm.,  Advanced emphysema.  Stable chronic extensive pleural calcifications, no worrisome pulmonary nodules.  Ill-defined left lower lobe pulmonary nodules unchanged since October 2020. Extensive cholelithiasis  10/27/2022 Follow up ; COPD, OSA , O2 RF , Lung nodule  Patient presents for a 42-month follow-up.  Patient has underlying severe COPD that is oxygen dependent.  Overall he says he has been doing okay except that he had a fall recently with some rib fractures.  Patient says he has been slowly healing from this.  Very sore and slow-moving.  Patient tripped at home while vacuuming.  Patient remains independent lives at home and is able to drive.  He gets short of breath with minimum activities.  He remains on Stiolto daily.  He denies any increased albuterol use.  He is on oxygen chronically at 2 L at rest and 3 L with activity.  He has underlying sleep apnea is on CPAP at bedtime.  Patient says he cannot sleep without it. CPAP download shows excellent compliance with 100% usage.  Daily average usage at 8 hours.  Patient is on auto CPAP 10 to 15 cm H2O.  AHI  1.3/hour daily average pressure at 12 cm H2O. Uses a full face mask.  Prevnar 13, Pneumovax and Prevnar 20 up-to-date.  COVID booster up-to-date. RSV up to date.  CT chest October 15, 2021 showed stable calcified plaque noted in the right hemothorax with associated scarring.  Emphysematous disease.  Minimally displaced fractures involving the left fourth fifth sixth and seventh ribs. Rib pain is getting better not as painful.  Using  cane. Says he is still very active.  Remains on Prednisone 5mg  daily . Platelets are stable-~87K  . Continues follow up with hematology .  Continues follow up with Cardiology , denies increased leg swelling .  Over last week with increased watery eyes and sneezing. Clear nasal drainage. Noticed with increased pollen outside. Started on Allegra and Flonase  Symptoms much better last 1-2 days .  Has history of RA , started on [place] couple of months ago for increased joint aches. Stiffness and  joint aches are better.  Last PFT in 2017, showed severe COPD. We discussed that we will not repeat unless change in clinical status.     No Known Allergies  Immunization History  Administered Date(s) Administered   COVID-19, mRNA, vaccine(Comirnaty)12 years and older 05/03/2022   Fluad Quad(high Dose 65+) 04/26/2019, 06/08/2020, 05/01/2021   Influenza Split 04/14/2011, 04/27/2013, 03/28/2014   Influenza Whole 05/28/2009, 04/27/2010, 03/31/2012   Influenza, High Dose Seasonal PF 04/10/2010, 04/11/2011, 04/19/2013, 03/21/2015, 04/05/2016, 05/16/2017, 03/28/2018, 04/08/2018   Influenza,inj,Quad PF,6+ Mos 03/17/2014, 04/04/2016   Influenza-Unspecified 04/30/2006, 04/28/2007, 04/28/2008, 05/29/2009, 04/10/2010, 05/26/2011, 05/07/2012, 05/28/2020, 05/01/2021, 04/27/2022   PFIZER Comirnaty(Gray Top)Covid-19 Tri-Sucrose Vaccine 11/29/2020, 05/01/2021   PFIZER(Purple Top)SARS-COV-2 Vaccination 08/19/2019, 09/09/2019, 03/15/2020, 04/08/2020, 12/09/2020   PNEUMOCOCCAL CONJUGATE-20 06/27/2022   Pfizer Covid-19 Vaccine Bivalent Booster 36yrs & up 05/01/2021   Pneumococcal Conjugate-13 09/20/2013, 03/29/2015   Pneumococcal Polysaccharide-23 05/28/2008, 04/25/2010, 04/19/2013   Pneumococcal-Unspecified 06/26/2006, 04/27/2013   Rsv, Bivalent, Protein Subunit Rsvpref,pf Evans Lance) 07/30/2022   Td 11/23/2009   Tdap 07/17/2011, 07/30/2022   Zoster, Live 04/28/2009, 11/08/2009    Past Medical History:  Diagnosis  Date   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   ablated   Anxiety    Aortic aneurysm 09/02/2017   BENIGN PROSTATIC HYPERTROPHY 11/23/2009   Cardiomyopathy [id number]   Chronic systolic CHF (congestive heart failure) 09/02/2017   COPD (chronic obstructive pulmonary disease)    CORONARY ARTERY DISEASE 11/23/2009   DECREASED HEARING, 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   DEGENERATIVE JOINT DISEASE 11/23/2009   DEPRESSION 11/23/2009   FATIGUE 11/23/2009   GAIT DISTURBANCE 12/10/2009   HEMOPTYSIS UNSPECIFIED 05/07/2010   High cholesterol    HYPERTENSION 07/30/2009   HYPOTHYROIDISM 07/30/2009   Ischemic cardiomyopathy 09/02/2017   LUMBAR RADICULOPATHY, 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   On home oxygen therapy    "2-3L; 24/7" (09/10/2016)   OSA on CPAP    Pneumonia    PTSD (post-traumatic stress disorder) 03/10/2012   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   RA (rheumatoid arthritis) 06/11/2011   "qwhere" (09/10/2016)   RESPIRATORY FAILURE, CHRONIC 07/31/2009   Scleritis of both eyes 03/17/2014   Thrombocytopenia    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   Type II diabetes mellitus     Tobacco History: Social History   Tobacco Use  Smoking Status Former   Packs/day: 2.50   Years: 40.00   Additional pack years: 0.00   Total pack years: 100.00   Types: Cigarettes, Pipe, Cigars   Quit date: 07/28/1998   Years since quitting: 24.2  Smokeless Tobacco Never   Counseling given: Not Answered   Outpatient Medications Prior to  Visit  Medication Sig Dispense Refill   acetaminophen (TYLENOL) 500 MG tablet Take 1,000 mg by mouth every 6 (six) hours as needed for mild pain.     albuterol (PROVENTIL) (2.5 MG/3ML) 0.083% nebulizer solution Take 3 mLs (2.5 mg total) by nebulization every 6 (six) hours as needed for wheezing or shortness of breath. 120 mL 12   albuterol (VENTOLIN HFA) 108 (90 Base) MCG/ACT inhaler Inhale 2 puffs into the lungs every 6 (six) hours as needed for wheezing or shortness of breath. 18 g 3   atorvastatin (LIPITOR) 40 MG tablet Take 1 tablet (40 mg  total) by mouth daily. 90 tablet 3   Carboxymethylcellul-Glycerin (LUBRICATING EYE DROPS OP) Place 1 drop into both eyes 4 (four) times daily as needed (dry eyes).      Cholecalciferol (VITAMIN D3) 2000 units capsule Take 2,000 Units by mouth daily.      clopidogrel (PLAVIX) 75 MG tablet TAKE 1 TABLET(75 MG) BY MOUTH DAILY 30 tablet 11   Continuous Blood Gluc Receiver (DEXCOM G7 RECEIVER) DEVI Use to check blood sugars 3x a day. DX CODE: E11.69, E78.5 1 each 0   Continuous Blood Gluc Sensor (DEXCOM G7 SENSOR) MISC Use to check blood sugars 3x a day. DX CODE: E11.69, E78.5 3 each 4   Continuous Blood Gluc Transmit (DEXCOM G6 TRANSMITTER) MISC by Does not apply route.     cyanocobalamin (VITAMIN B12) 500 MCG tablet TAKE ONE TABLET BY MOUTH DAILY FOR LOW B12 LEVEL     empagliflozin (JARDIANCE) 10 MG TABS tablet Take 5 mg by mouth daily. Filled at [place].     ferrous sulfate 324 (65 Fe) MG TBEC Take 1 tablet by mouth daily.     fluticasone (FLONASE) 50 MCG/ACT nasal spray Place 1 spray into both nostrils daily. (Patient taking differently: Place 1 spray into both nostrils daily as needed for allergies.) 18.2 mL 2   insulin glargine (LANTUS) 100 UNIT/ML injection Inject 30 Units into the skin at bedtime.     leflunomide (ARAVA) 10 MG tablet TAKE ONE TABLET BY MOUTH DAILY FOR RHEUMATOID ARTHRITIS     levothyroxine (SYNTHROID) 175 MCG tablet Take 175 mcg by mouth daily before breakfast.     losartan (COZAAR) 50 MG tablet Take 1 tablet (50 mg total) by mouth daily. 90 tablet 3   metFORMIN (GLUCOPHAGE) 500 MG tablet Take 500-1,000 mg by mouth 2 (two) times daily with a meal. Take 1 tablet (500 mg) by mouth in the morning & take 2 tablets (1000 mg) by mouth in the evening.     Multiple Vitamins-Minerals (MEGA MULTIVITAMIN FOR MEN PO) Take 1 tablet by mouth daily.     OXYGEN Inhale 2 L into the lungs continuous.     predniSONE (DELTASONE) 5 MG tablet TAKE 1 TABLET(5 MG) BY MOUTH DAILY WITH BREAKFAST (Patient  taking differently: Take 5 mg by mouth daily with breakfast.) 90 tablet 3   pregabalin (LYRICA) 75 MG capsule Take 1 capsule (75 mg total) by mouth 2 (two) times daily.     Semaglutide (OZEMPIC, 1 MG/DOSE, [place]) Inject 1 mg into the skin every Friday.     tamsulosin (FLOMAX) 0.4 MG CAPS capsule Take 1 capsule (0.4 mg total) by mouth at bedtime. 30 capsule 0   Tiotropium Bromide-Olodaterol (STIOLTO RESPIMAT) 2.5-2.5 MCG/ACT AERS Inhale 2 puffs into the lungs daily. 4 g 0   No facility-administered medications prior to visit.     Review of Systems:   Constitutional:   No  weight  loss, night sweats,  Fevers, chills, fatigue, or  lassitude.  HEENT:   No headaches,  Difficulty swallowing,  Tooth/dental problems, or  Sore throat,                No sneezing, itching, ear ache, nasal congestion, post nasal drip,   CV:  No chest pain,  Orthopnea, PND, +swelling in lower extremities, anasarca, dizziness, palpitations, syncope.   GI  No heartburn, indigestion, abdominal pain, nausea, vomiting, diarrhea, change in bowel habits, loss of appetite, bloody stools.   Resp:  No excess mucus, no productive cough,  No non-productive cough,  No coughing up of blood.  No change in color of mucus.  No wheezing.  No chest wall deformity  Skin: no rash or lesions.  GU: no dysuria, change in color of urine, no urgency or frequency.  No flank pain, no hematuria   MS:  No joint pain or swelling.  No decreased range of motion.  No back pain.    Physical Exam  BP (!) 154/78 (BP Location: Left Arm, Patient Position: Sitting, Cuff Size: Normal)   Pulse 77   Temp 98.1 F (36.7 C) (Oral)   Ht 6\' 2"  (1.88 m)   Wt 197 lb (89.4 kg)   SpO2 97%   BMI 25.29 kg/m   GEN: A/Ox3; pleasant , NAD, well nourished , O2 , Cane    HEENT:  Central/AT,  NOSE-clear, THROAT-clear, no lesions, no postnasal drip or exudate noted.   NECK:  Supple w/ fair ROM; no JVD; normal carotid impulses w/o bruits; no thyromegaly or nodules  palpated; no lymphadenopathy.    RESP  Clear  P & A; w/o, wheezes/ rales/ or rhonchi. no accessory muscle use, no dullness to percussion  CARD:  RRR, no m/r/g, 1+ peripheral edema, pulses intact, no cyanosis or clubbing.  GI:   Soft & nt; nml bowel sounds; no organomegaly or masses detected.   Musco: Warm bil, no deformities or joint swelling noted.   Neuro: alert, no focal deficits noted.    Skin: Warm, no lesions or rashes    Lab Results:  CBC    Component Value Date/Time   WBC 6.7 10/15/2022 0817   WBC 8.7 11/14/2020 0945   RBC 4.87 10/15/2022 0817   HGB 13.1 10/15/2022 0817   HGB 12.0 (L) 06/28/2019 1453   HCT 41.6 10/15/2022 0817   HCT 38.4 06/28/2019 1453   PLT 87 (L) 10/15/2022 0817   PLT 44 (LL) 06/28/2019 1453   MCV 85.4 10/15/2022 0817   MCV 89 06/28/2019 1453   MCH 26.9 10/15/2022 0817   MCHC 31.5 10/15/2022 0817   RDW 16.1 (H) 10/15/2022 0817   RDW 13.8 06/28/2019 1453   LYMPHSABS 1.1 10/15/2022 0817   LYMPHSABS 1.2 12/23/2016 1053   MONOABS 0.7 10/15/2022 0817   EOSABS 0.1 10/15/2022 0817   EOSABS 0.3 12/23/2016 1053   BASOSABS 0.1 10/15/2022 0817   BASOSABS 0.0 12/23/2016 1053    BMET    Component Value Date/Time   NA 142 03/13/2022 0736   NA 140 10/16/2021 1047   K 4.4 03/13/2022 0736   CL 104 03/13/2022 0736   CO2 19 (L) 10/16/2021 1047   GLUCOSE 89 03/13/2022 0736   BUN 30 (H) 03/13/2022 0736   BUN 20 10/16/2021 1047   CREATININE 1.20 03/13/2022 0736   CREATININE 1.17 04/13/2020 0915   CALCIUM 9.5 10/16/2021 1047   GFRNONAA >60 08/26/2020 0314   GFRNONAA >60 07/15/2018 1059   GFRAA 58 (  L) 02/22/2020 1029   GFRAA >60 07/15/2018 1059    BNP No results found for: "BNP"  ProBNP    Component Value Date/Time   PROBNP 188 10/20/2016 1038   PROBNP 59.9 05/04/2011 0517    Imaging: CT Angio Chest/Abd/Pel for Dissection W and/or W/WO  Result Date: 10/16/2022 CLINICAL DATA:  Thoracic aortic aneurysm. EXAM: CT ANGIOGRAPHY CHEST,  ABDOMEN AND PELVIS TECHNIQUE: Non-contrast CT of the chest was initially obtained. Multidetector CT imaging through the chest, abdomen and pelvis was performed using the standard protocol during bolus administration of intravenous contrast. Multiplanar reconstructed images and MIPs were obtained and reviewed to evaluate the vascular anatomy. RADIATION DOSE REDUCTION: This exam was performed according to the departmental dose-optimization program which includes automated exposure control, adjustment of the mA and/or kV according to patient size and/or use of iterative reconstruction technique. CONTRAST:  125mL OMNIPAQUE IOHEXOL 350 MG/ML SOLN COMPARISON:  October 22, 2021.  March 02, 2020. FINDINGS: CTA CHEST FINDINGS Cardiovascular: Grossly stable focal aneurysmal dilatation is seen involving the aortic arch with maximum measured diameter 4.2 cm. No dissection is noted. Great vessels are widely patent. Normal cardiac size. No pericardial effusion. Mediastinum/Nodes: No enlarged mediastinal, hilar, or axillary lymph nodes. Thyroid gland, trachea, and esophagus demonstrate no significant findings. Lungs/Pleura: No pneumothorax or pleural effusion is noted. Stable calcified pleural plaques are noted in the right hemithorax with associated scarring. Emphysematous disease is noted bilaterally. No acute abnormality is noted. Musculoskeletal: Minimally displaced fractures are seen involving the left fourth fifth, sixth and seventh ribs. Review of the MIP images confirms the above findings. CTA ABDOMEN AND PELVIS FINDINGS VASCULAR Aorta: Status post aortobifemoral bypass graft placement. The graft and its limbs are widely patent. Celiac: Stable 1.5 cm aneurysm noted at origin. SMA: Stable 1.6 cm aneurysm at origin. Renals: Stable 21 mm aneurysm noted at origin of left renal artery. Right renal artery is widely patent. IMA: Occluded by graft placement. Inflow: Stable 2.3 cm aneurysm involving right common iliac artery with  mural thrombus. Veins: No obvious venous abnormality within the limitations of this arterial phase study. Review of the MIP images confirms the above findings. NON-VASCULAR Hepatobiliary: Cholelithiasis. No biliary dilatation. Liver is unremarkable. Pancreas: Unremarkable. No pancreatic ductal dilatation or surrounding inflammatory changes. Spleen: Normal in size without focal abnormality. Adrenals/Urinary Tract: Adrenal glands appear normal. Stable right renal cyst for which no further follow-up is required. No hydronephrosis or renal obstruction is noted. Multiple bladder calculi are noted. Stomach/Bowel: There is no evidence of bowel obstruction or inflammation. No acute abnormality seen involving the stomach. The appendix appears normal. Diverticulosis of descending and sigmoid colon is noted without inflammation. Lymphatic: No adenopathy is noted. Reproductive: Prostate is unremarkable. Other: No abdominal wall hernia or abnormality. No abdominopelvic ascites. Musculoskeletal: Old T12 fracture is noted. No acute osseous abnormality is noted. Review of the MIP images confirms the above findings. IMPRESSION: Grossly stable 4.2 cm focal aneurysm seen involving aortic arch. Recommend semi-annual imaging followup by CTA or MRA and referral to cardiothoracic surgery if not already obtained. This recommendation follows 2010 ACCF/AHA/AATS/ACR/ASA/SCA/SCAI/SIR/STS/SVM Guidelines for the Diagnosis and Management of Patients With Thoracic Aortic Disease. Circulation. 2010; [id number]. Aortic aneurysm NOS (ICD10-I71.9). Stable calcified pleural plaques and associated scarring are noted in right lung. Minimally displaced fractures are seen involving the left fourth, fifth, sixth and seventh ribs. Status post aortobifemoral bypass graft placement. Stable aneurysmal dilatation involving the origins of the celiac, superior mesenteric and left renal arteries as well as the right common  iliac artery. Multiple bladder calculi  are noted. Diverticulosis of descending and sigmoid colon without inflammation. Cholelithiasis. Aortic Atherosclerosis (ICD10-I70.0) and Emphysema (ICD10-J43.9). Electronically Signed   By: Marijo Conception M.D.   On: 10/16/2022 16:39         Latest Ref Rng & Units 11/16/2015    9:55 AM 12/30/2013    9:45 AM  PFT Results  FVC-Pre L 2.96  3.01   FVC-Predicted Pre % 59  59   FVC-Post L 2.89  3.01   FVC-Predicted Post % 58  59   Pre FEV1/FVC % % 43  45   Post FEV1/FCV % % 46  49   FEV1-Pre L 1.28  1.36   FEV1-Predicted Pre % 35  36   FEV1-Post L 1.33  1.48   DLCO uncorrected ml/min/mmHg 9.88  10.13   DLCO UNC% % 26  26   DLCO corrected ml/min/mmHg 9.94    DLCO COR %Predicted % 26    DLVA Predicted % 40  39   TLC L  6.31   TLC % Predicted %  80   RV % Predicted %  80     No results found for: "NITRICOXIDE"      Assessment & Plan:   COPD (chronic obstructive pulmonary disease) (HCC) COPD compensated on present regimen.  Hold on additional PFTs at this time.  If has clinical change can consider repeat testing.  Recent CT chest showed stable scarring and emphysema.  Immunizations are up-to-date  Plan  Patient Instructions  Continue on Stiolto 2 puffs daily .  Albuterol inhaler /nebulizer As needed   Mucinex DM Twice daily  As needed  Cough/congestion .  Continue on Oxygen 2l/m rest and 3l/m with activity .  Allegra daily As needed   Flonase daily As needed   Continue on CPAP At bedtime wear with Naps.  Activity as tolerated.  Saline nasal rinses Twice daily   Saline nasal gel At bedtime .  Follow up in 4 Months  with Dr. Elsworth Soho or Jamichael Knotts NP and As needed   Please contact office for sooner follow up if symptoms do not improve or worsen or seek emergency care     Chronic respiratory failure with hypoxia ([place]) Compensated on oxygen to keep O2 saturations greater than 88 to 90%.  Chronic systolic CHF (congestive heart failure) (HCC) Appears compensated without evidence of  volume overload.  Continue follow-up with cardiology most recent CT angio chest abdomen and pelvis appears stable.  Lung nodules Recent CT chest showed stable scarring.  OSA on CPAP Excellent control and compliance on nocturnal CPAP  Rib fractures Left-sided rib fracture after fall.  Appears to be healing.  Pain is manageable.  Continue activity as tolerated  Allergic rhinitis Mild flare.  Continue with Allegra and Flonase as needed     Rexene Edison, NP 10/27/2022

## 2022-10-27 NOTE — Assessment & Plan Note (Signed)
Left-sided rib fracture after fall.  Appears to be healing.  Pain is manageable.  Continue activity as tolerated

## 2022-10-27 NOTE — Patient Instructions (Addendum)
Continue on Stiolto 2 puffs daily .  Albuterol inhaler /nebulizer As needed   Mucinex DM Twice daily  As needed  Cough/congestion .  Continue on Oxygen 2l/m rest and 3l/m with activity .  Allegra daily As needed   Flonase daily As needed   Continue on CPAP At bedtime wear with Naps.  Activity as tolerated.  Saline nasal rinses Twice daily   Saline nasal gel At bedtime .  Follow up in 4 Months  with Dr. Elsworth Soho or Freddye Cardamone NP and As needed   Please contact office for sooner follow up if symptoms do not improve or worsen or seek emergency care

## 2022-10-27 NOTE — Assessment & Plan Note (Signed)
Recent CT chest showed stable scarring.

## 2022-10-27 NOTE — Assessment & Plan Note (Signed)
Appears compensated without evidence of volume overload.  Continue follow-up with cardiology most recent CT angio chest abdomen and pelvis appears stable.

## 2022-10-30 DIAGNOSIS — G4733 Obstructive sleep apnea (adult) (pediatric): Secondary | ICD-10-CM | POA: Diagnosis not present

## 2022-10-30 DIAGNOSIS — J449 Chronic obstructive pulmonary disease, unspecified: Secondary | ICD-10-CM | POA: Diagnosis not present

## 2022-11-04 ENCOUNTER — Ambulatory Visit (HOSPITAL_COMMUNITY)
Admission: RE | Admit: 2022-11-04 | Discharge: 2022-11-04 | Disposition: A | Discharge status: Home / Self Care | Payer: Medicare PPO | Source: Ambulatory Visit | Attending: Vascular Surgery | Admitting: Vascular Surgery

## 2022-11-04 ENCOUNTER — Encounter: Payer: Self-pay | Admitting: Vascular Surgery

## 2022-11-04 ENCOUNTER — Ambulatory Visit (INDEPENDENT_AMBULATORY_CARE_PROVIDER_SITE_OTHER): Payer: Medicare PPO | Admitting: Vascular Surgery

## 2022-11-04 VITALS — BP 178/90 | HR 65 | Temp 98.4°F | Resp 20 | Ht 74.0 in | Wt 194.9 lb

## 2022-11-04 DIAGNOSIS — I724 Aneurysm of artery of lower extremity: Secondary | ICD-10-CM

## 2022-11-04 DIAGNOSIS — I739 Peripheral vascular disease, unspecified: Secondary | ICD-10-CM | POA: Diagnosis not present

## 2022-11-04 LAB — VAS US ABI WITH/WO TBI
Left ABI: 0.5
Right ABI: 1.07

## 2022-11-04 NOTE — Progress Notes (Signed)
Patient name: [name] MRN: [id number] DOB: May 11, 1941 Sex: male  REASON FOR VISIT:.  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 male with multiple medical comorbidities including COPD (on home oxygen) and CHF who presents for 6 month follow-up.  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  Most recently on 10/24/2021 he underwent angioplasty of the distal left common femoral to PT vein bypass with a 4 mm angioplasty balloon given high grade stenosis.  He then developed a recurrent stenosis and we took him on 03/13/2022 for another left lower extremity intervention and his bypass was found to be occluded.  No intervention was performed given this was a transradial approach with a silent occlusion.  Of note he does have a history of an aortobiiliac bypass for AAA in 2004.  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  He has been delaying intervention on the right popliteal aneurysm given this is not good anatomy for stent graft and he does not feel he can sustain another bypass.  Follow-up today he states his left leg is doing okay.  He denies any rest pain or new wounds.   Past Medical History:  Diagnosis Date   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   ablated   Anxiety    Aortic aneurysm 09/02/2017   BENIGN PROSTATIC HYPERTROPHY 11/23/2009   Cardiomyopathy 08/28/2016   Chronic systolic CHF (congestive heart failure) 09/02/2017   COPD (chronic obstructive pulmonary disease)    CORONARY ARTERY DISEASE 11/23/2009   DECREASED HEARING, 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   DEGENERATIVE JOINT DISEASE 11/23/2009   DEPRESSION 11/23/2009   FATIGUE 11/23/2009   GAIT DISTURBANCE 12/10/2009   HEMOPTYSIS UNSPECIFIED 05/07/2010   High  cholesterol    HYPERTENSION 07/30/2009   HYPOTHYROIDISM 07/30/2009   Ischemic cardiomyopathy 09/02/2017   LUMBAR RADICULOPATHY, 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   On home oxygen therapy    "2-3L; 24/7" (09/10/2016)   OSA on CPAP    Pneumonia    PTSD (post-traumatic stress disorder) 03/10/2012   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   RA (rheumatoid arthritis) 06/11/2011   "qwhere" (09/10/2016)   RESPIRATORY FAILURE, CHRONIC 07/31/2009   Scleritis of both eyes 03/17/2014   Thrombocytopenia    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   Type II diabetes mellitus     Past Surgical History:  Procedure Laterality Date   ABDOMINAL AORTIC ANEURYSM REPAIR  07/2002   [name] 12/10/2010   ABDOMINAL AORTOGRAM W/LOWER EXTREMITY N/A 08/16/2020   Procedure: ABDOMINAL AORTOGRAM W/LOWER EXTREMITY;  Surgeon: Cephus Shelling, MD;  Location: [facility];  Service: Cardiovascular;  Laterality: N/A;   ABDOMINAL AORTOGRAM W/LOWER EXTREMITY N/A 10/24/2021   Procedure: ABDOMINAL AORTOGRAM W/LOWER EXTREMITY;  Surgeon: Cephus Shelling, MD;  Location: [facility];  Service: Cardiovascular;  Laterality: N/A;   ABDOMINAL AORTOGRAM W/LOWER EXTREMITY N/A 03/13/2022   Procedure: ABDOMINAL AORTOGRAM W/LOWER EXTREMITY;  Surgeon: Cephus Shelling, MD;  Location: [facility];  Service: Cardiovascular;  Laterality: N/A;   ABDOMINAL EXPLORATION SURGERY  02/2004   w/LOA/notes 12/10/2010; small bowel obstruction repair with adhesiolysis    BACK SURGERY     CARDIAC CATHETERIZATION     2 heart caths in the past.  One in 2000s showed one ulcerated plaque  Rx  medically; Second at [facility] /notes 09/05/2016   CATARACT EXTRACTION W/ INTRAOCULAR LENS  IMPLANT, BILATERAL Bilateral 2000s   COLECTOMY     hx of remote ileum resection due to bleeding   COLONOSCOPY WITH PROPOFOL N/A 01/22/2018   Procedure: COLONOSCOPY WITH PROPOFOL;  Surgeon: Iva Boop, MD;  Location: [facility];  Service: Endoscopy;  Laterality: N/A;   CORONARY ANGIOPLASTY WITH STENT PLACEMENT   09/10/2016   CORONARY STENT INTERVENTION N/A 09/10/2016   Procedure: Coronary Stent Intervention;  Surgeon: Kathleene Hazel, MD;  Location: [facility];  Service: Cardiovascular;  Laterality: N/A;  Distal RCA 4.0x16 Synergy   ESOPHAGOGASTRODUODENOSCOPY (EGD) WITH PROPOFOL N/A 01/22/2018   Procedure: ESOPHAGOGASTRODUODENOSCOPY (EGD) WITH PROPOFOL;  Surgeon: Iva Boop, MD;  Location: [facility];  Service: Endoscopy;  Laterality: N/A;   EYE SURGERY     FALSE ANEURYSM REPAIR Left 08/22/2020   Procedure: EXCLUSION OF LEFT POPLITEAL ARTERY ANEURYSM;  Surgeon: Cephus Shelling, MD;  Location: [facility];  Service: Vascular;  Laterality: Left;   FEMORAL EMBOLOECTOMY Left 07/2000   with left leg ischemia; Dr. Hart Rochester, vascular   FEMORAL-POPLITEAL BYPASS GRAFT Bilateral 08/22/2020   Procedure: LEFT FEMORAL-POSTERIOR TIBIAL ARTERY BYPASS GRAFT USING RIGHT GREATER NONREVERSED SAPHENOUS VEIN GRAFT;  Surgeon: Cephus Shelling, MD;  Location: [facility] OR;  Service: Vascular;  Laterality: Bilateral;   GANGLION CYST EXCISION Right    "wrist"; Dr. Teressa Senter   HOT HEMOSTASIS N/A 01/22/2018   Procedure: HOT HEMOSTASIS (ARGON PLASMA COAGULATION/BICAP);  Surgeon: Iva Boop, MD;  Location: [facility];  Service: Endoscopy;  Laterality: N/A;   LOOP RECORDER INSERTION N/A 04/25/2019   Procedure: LOOP RECORDER INSERTION;  Surgeon: Duke Salvia, MD;  Location: [facility] INV[facility] CV LAB;  Service: Cardiovascular;  Laterality: N/A;   LOOP RECORDER REMOVAL N/A 07/01/2019   Procedure: LOOP RECORDER REMOVAL;  Surgeon: Duke Salvia, MD;  Location: [facility] INV[facility] CV [facility];  Service: Cardiovascular;  Laterality: N/A;   LUMBAR LAMINECTOMY  1972   Dr. Fannie Knee   PACEMAKER IMPLANT N/A 07/01/2019   Procedure: PACEMAKER IMPLANT;  Surgeon: Duke Salvia, MD;  Location: [facility] INVASIVE CV LAB;  Service: Cardiovascular;  Laterality: N/A;   PERIPHERAL VASCULAR BALLOON ANGIOPLASTY Left 10/24/2021   Procedure: PERIPHERAL VASCULAR  BALLOON ANGIOPLASTY;  Surgeon: Cephus Shelling, MD;  Location: [facility];  Service: Cardiovascular;  Laterality: Left;   RIGHT/LEFT HEART CATH AND CORONARY ANGIOGRAPHY N/A 09/10/2016   Procedure: Right/Left Heart Cath and Coronary Angiography;  Surgeon: Kathleene Hazel, MD;  Location: [facility] INVASIVE CV LAB;  Service: Cardiovascular;  Laterality: N/A;   TONSILLECTOMY      Family History  Problem Relation Age of Onset   Other Mother        gun shot    SOCIAL HISTORY: Social History   Tobacco Use   Smoking status: Former    Packs/day: 2.50    Years: 40.00    Additional pack years: 0.00    Total pack years: 100.00    Types: Cigarettes, Pipe, Cigars    Quit date: 07/28/1998    Years since quitting: 24.2    Passive exposure: Never   Smokeless tobacco: Never  Substance Use Topics   Alcohol use: No    Alcohol/week: 0.0 standard drinks of alcohol    No Known Allergies  Current Outpatient Medications  Medication Sig Dispense Refill   acetaminophen (TYLENOL) 500 MG tablet Take 1,000 mg by mouth every 6 (six) hours as needed for mild pain.  albuterol (PROVENTIL) (2.5 MG/3ML) 0.083% nebulizer solution Take 3 mLs (2.5 mg total) by nebulization every 6 (six) hours as needed for wheezing or shortness of breath. 120 mL 12   albuterol (VENTOLIN HFA) 108 (90 Base) MCG/ACT inhaler Inhale 2 puffs into the lungs every 6 (six) hours as needed for wheezing or shortness of breath. 18 g 3   atorvastatin (LIPITOR) 40 MG tablet Take 1 tablet (40 mg total) by mouth daily. 90 tablet 3   Carboxymethylcellul-Glycerin (LUBRICATING EYE DROPS OP) Place 1 drop into both eyes 4 (four) times daily as needed (dry eyes).      Cholecalciferol (VITAMIN D3) 2000 units capsule Take 2,000 Units by mouth daily.      clopidogrel (PLAVIX) 75 MG tablet TAKE 1 TABLET(75 MG) BY MOUTH DAILY 30 tablet 11   Continuous Blood Gluc Receiver (DEXCOM G7 RECEIVER) DEVI Use to check blood sugars 3x a day. DX CODE:  E11.69, E78.5 1 each 0   Continuous Blood Gluc Sensor (DEXCOM G7 SENSOR) MISC Use to check blood sugars 3x a day. DX CODE: E11.69, E78.5 3 each 4   Continuous Blood Gluc Transmit (DEXCOM G6 TRANSMITTER) MISC by Does not apply route.     cyanocobalamin (VITAMIN B12) 500 MCG tablet TAKE ONE TABLET BY MOUTH DAILY FOR LOW B12 LEVEL     empagliflozin (JARDIANCE) 10 MG TABS tablet Take 5 mg by mouth daily. Filled at [place].     ferrous sulfate 324 (65 Fe) MG TBEC Take 1 tablet by mouth daily.     fluticasone (FLONASE) 50 MCG/ACT nasal spray Place 1 spray into both nostrils daily. (Patient taking differently: Place 1 spray into both nostrils daily as needed for allergies.) 18.2 mL 2   insulin glargine (LANTUS) 100 UNIT/ML injection Inject 30 Units into the skin at bedtime.     leflunomide (ARAVA) 10 MG tablet TAKE ONE TABLET BY MOUTH DAILY FOR RHEUMATOID ARTHRITIS     levothyroxine (SYNTHROID) 175 MCG tablet Take 175 mcg by mouth daily before breakfast.     losartan (COZAAR) 50 MG tablet Take 1 tablet (50 mg total) by mouth daily. 90 tablet 3   metFORMIN (GLUCOPHAGE) 500 MG tablet Take 500-1,000 mg by mouth 2 (two) times daily with a meal. Take 1 tablet (500 mg) by mouth in the morning & take 2 tablets (1000 mg) by mouth in the evening.     Multiple Vitamins-Minerals (MEGA MULTIVITAMIN FOR MEN PO) Take 1 tablet by mouth daily.     OXYGEN Inhale 2 L into the lungs continuous.     predniSONE (DELTASONE) 5 MG tablet TAKE 1 TABLET(5 MG) BY MOUTH DAILY WITH BREAKFAST (Patient taking differently: Take 5 mg by mouth daily with breakfast.) 90 tablet 3   pregabalin (LYRICA) 75 MG capsule Take 1 capsule (75 mg total) by mouth 2 (two) times daily.     Semaglutide (OZEMPIC, 1 MG/DOSE, [place]) Inject 1 mg into the skin every Friday.     tamsulosin (FLOMAX) 0.4 MG CAPS capsule Take 1 capsule (0.4 mg total) by mouth at bedtime. 30 capsule 0   Tiotropium Bromide-Olodaterol (STIOLTO RESPIMAT) 2.5-2.5 MCG/ACT AERS Inhale 2 puffs  into the lungs daily. 4 g 0   No current facility-administered medications for this visit.    REVIEW OF SYSTEMS:  [X]  denotes positive finding, [ ]  denotes negative finding Cardiac  Comments:  Chest pain or chest pressure:    Shortness of breath upon exertion:    Short of breath when lying flat:  Irregular heart rhythm:        Vascular    Pain in calf, thigh, or hip brought on by ambulation:    Pain in feet at night that wakes you up from your sleep:     Blood clot in your veins:    Leg swelling:         Pulmonary    Oxygen at home:    Productive cough:     Wheezing:         Neurologic    Sudden weakness in arms or legs:     Sudden numbness in arms or legs:     Sudden onset of difficulty speaking or slurred speech:    Temporary loss of vision in one eye:     Problems with dizziness:         Gastrointestinal    Blood in stool:     Vomited blood:         Genitourinary    Burning when urinating:     Blood in urine:        Psychiatric    Major depression:         Hematologic    Bleeding problems:    Problems with blood clotting too easily:        Skin    Rashes or ulcers:        Constitutional    Fever or chills:      PHYSICAL EXAM: Vitals:   11/04/22 1527  BP: (!) 178/90  Pulse: 65  Resp: 20  Temp: 98.4 F (36.9 C)  TempSrc: Temporal  SpO2: 93%  Weight: 194 lb 14.4 oz (88.4 kg)  Height: 6\' 2"  (1.88 m)    GENERAL: The patient is a well-nourished male, in no acute distress. The vital signs are documented above. CARDIAC: There is a regular rate and rhythm.  VASCULAR:  Bilateral femoral pulses palpable Right DP palpable Left PT monophasic and brisk by doppler No lower extremity tissue loss Neuro: Grossly neurologically intact  DATA:   ABI's today are 1.07 on the right biphasic and 0.5 on the left monophasic  Assessment/Plan:  82 year-old male status post left common femoral to PT bypass on 08/22/2020 requiring harvest of right leg great  saphenous vein for 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  He developed a significant stenosis in the distal bypass that was treated with balloon angioplasty on 10/24/21 with good results.  This looked much better on follow-up but unfortunately on 36-month interval surveillance he had evidence of a recurrent high-grade stenosis at the distal anastomosis.  When I took him for lower extremity angiogram last year again and he was found to have an occluded bypass.  This was done from a transradial approach given his aortobiiliac bypass years ago.  No intervention was performed given we did not have any devices long enough for intervention and it was a silent occlusion.      On interval 82-month follow-up today he is still doing well.  He has no rest pain or wounds in the left lower extremity even with his occluded bypass.  I discussed the importance of protecting his feet.  I discussed walking therapies.  He does not feel he can sustain another bypass and given no symptoms I think this is reasonable.  He does have an aneurysm in the right popliteal artery that is not good anatomy for stent grafting and again he has previously said he could not undergo another open bypass surgery  with his co-morbidities and the long recovery from his last bypass surgery.  I will see him again in 6 months with repeat ABI's.  Cephus Shellinghristopher J. Tigerlily Christine, MD [facility] Office: [phone]

## 2022-11-07 ENCOUNTER — Other Ambulatory Visit: Payer: Self-pay

## 2022-11-07 DIAGNOSIS — I739 Peripheral vascular disease, unspecified: Secondary | ICD-10-CM

## 2022-12-11 NOTE — Progress Notes (Signed)
HPI: FU CAD. 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 Carotid dopplers 2/19 showed no significant stenosis. Admitted with sepsis 2/19 and also with atrial fibrillation, converted spontaneously to sinus; TSH low and synthroid adjusted. Placed on amiodarone. Amiodarone discontinued at previous office visit. Echocardiogram August 2020 showed normal LV function. Had pacemaker placed 12/20 due to history of syncope and pauses noted on monitor. 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 Apixaban on hold. Peripheral vascular disease followed by vascular surgery.  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  ABIs April 2024 normal on the right with abnormal toe-brachial index, moderate on the left with abnormal toe-brachial index.  Since last seen, he does have chronic dyspnea on exertion.  No orthopnea, PND, chest pain or syncope.  Minimal pedal edema.  Current Outpatient Medications  Medication Sig Dispense Refill   acetaminophen (TYLENOL) 500 MG tablet Take 1,000 mg by mouth every 6 (six) hours as needed for mild pain.     albuterol (PROVENTIL) (2.5 MG/3ML) 0.083% nebulizer solution Take 3 mLs (2.5 mg total) by nebulization every 6 (six) hours as needed for wheezing or shortness of breath. 120 mL 12   albuterol (VENTOLIN HFA) 108 (90 Base) MCG/ACT inhaler Inhale 2 puffs into the lungs every 6 (six) hours as needed for wheezing or shortness of breath. 18 g 3   atorvastatin (LIPITOR) 40 MG tablet Take 1 tablet (40 mg total) by mouth daily. 90 tablet 3   Carboxymethylcellul-Glycerin (LUBRICATING EYE DROPS OP) Place 1 drop into both eyes 4 (four) times daily as needed (dry eyes).      Cholecalciferol (VITAMIN D3) 2000 units capsule Take 2,000 Units by mouth  daily.      clopidogrel (PLAVIX) 75 MG tablet TAKE 1 TABLET(75 MG) BY MOUTH DAILY 30 tablet 11   Continuous Blood Gluc Receiver (DEXCOM G7 RECEIVER) DEVI Use to check blood sugars 3x a day. DX CODE: E11.69, E78.5 1 each 0   Continuous Blood Gluc Sensor (DEXCOM G7 SENSOR) MISC Use to check blood sugars 3x a day. DX CODE: E11.69, E78.5 3 each 4   Continuous Blood Gluc Transmit (DEXCOM G6 TRANSMITTER) MISC by Does not apply route.     cyanocobalamin (VITAMIN B12) 500 MCG tablet TAKE ONE TABLET BY MOUTH DAILY FOR LOW B12 LEVEL     empagliflozin (JARDIANCE) 10 MG TABS tablet Take 5 mg by mouth daily. Filled at [place].     ferrous sulfate 324 (65 Fe) MG TBEC Take 1 tablet by mouth daily.     fluticasone (FLONASE) 50 MCG/ACT nasal spray Place 1 spray into both nostrils daily. (Patient taking differently: Place 1 spray into both nostrils daily as needed for allergies.) 18.2 mL 2   insulin glargine (LANTUS) 100 UNIT/ML injection Inject 30 Units into the skin at bedtime.     leflunomide (ARAVA) 10 MG tablet TAKE ONE TABLET BY MOUTH DAILY FOR RHEUMATOID ARTHRITIS     levothyroxine (SYNTHROID) 175 MCG tablet Take 175 mcg by mouth daily before breakfast.     losartan (COZAAR) 50 MG tablet Take 1 tablet (50 mg total) by mouth daily. 90 tablet 3   metFORMIN (GLUCOPHAGE) 500 MG tablet Take 500-1,000 mg by mouth 2 (two) times daily with a meal. Take  1 tablet (500 mg) by mouth in the morning & take 2 tablets (1000 mg) by mouth in the evening.     Multiple Vitamins-Minerals (MEGA MULTIVITAMIN FOR MEN PO) Take 1 tablet by mouth daily.     OXYGEN Inhale 2 L into the lungs continuous.     predniSONE (DELTASONE) 5 MG tablet TAKE 1 TABLET(5 MG) BY MOUTH DAILY WITH BREAKFAST (Patient taking differently: Take 5 mg by mouth daily with breakfast.) 90 tablet 3   pregabalin (LYRICA) 75 MG capsule Take 1 capsule (75 mg total) by mouth 2 (two) times daily.     Semaglutide (OZEMPIC, 1 MG/DOSE, [place]) Inject 1 mg into the skin every  Friday.     tamsulosin (FLOMAX) 0.4 MG CAPS capsule Take 1 capsule (0.4 mg total) by mouth at bedtime. 30 capsule 0   Tiotropium Bromide-Olodaterol (STIOLTO RESPIMAT) 2.5-2.5 MCG/ACT AERS Inhale 2 puffs into the lungs daily. 4 g 0   No current facility-administered medications for this visit.     Past Medical History:  Diagnosis Date   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   ablated   Anxiety    Aortic aneurysm (HCC) 09/02/2017   BENIGN PROSTATIC HYPERTROPHY 11/23/2009   Cardiomyopathy (HCC) 08/28/2016   Chronic systolic CHF (congestive heart failure) (HCC) 09/02/2017   COPD (chronic obstructive pulmonary disease) (HCC)    CORONARY ARTERY DISEASE 11/23/2009   DECREASED HEARING, 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   DEGENERATIVE JOINT DISEASE 11/23/2009   DEPRESSION 11/23/2009   FATIGUE 11/23/2009   GAIT DISTURBANCE 12/10/2009   HEMOPTYSIS UNSPECIFIED 05/07/2010   High cholesterol    HYPERTENSION 07/30/2009   HYPOTHYROIDISM 07/30/2009   Ischemic cardiomyopathy 09/02/2017   LUMBAR RADICULOPATHY, 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   On home oxygen therapy    "2-3L; 24/7" (09/10/2016)   OSA on CPAP    Pneumonia    PTSD (post-traumatic stress disorder) 03/10/2012   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   RA (rheumatoid arthritis) (HCC) 06/11/2011   "qwhere" (09/10/2016)   RESPIRATORY FAILURE, CHRONIC 07/31/2009   Scleritis of both eyes 03/17/2014   Thrombocytopenia (HCC)    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   Type II diabetes mellitus (HCC)     Past Surgical History:  Procedure Laterality Date   ABDOMINAL AORTIC ANEURYSM REPAIR  07/2002   [name] 12/10/2010   ABDOMINAL AORTOGRAM W/LOWER EXTREMITY N/A 08/16/2020   Procedure: ABDOMINAL AORTOGRAM W/LOWER EXTREMITY;  Surgeon: Cephus Shelling, MD;  Location: [facility];  Service: Cardiovascular;  Laterality: N/A;   ABDOMINAL AORTOGRAM W/LOWER EXTREMITY N/A 10/24/2021   Procedure: ABDOMINAL AORTOGRAM W/LOWER EXTREMITY;  Surgeon: Cephus Shelling, MD;  Location: [facility];  Service:  Cardiovascular;  Laterality: N/A;   ABDOMINAL AORTOGRAM W/LOWER EXTREMITY N/A 03/13/2022   Procedure: ABDOMINAL AORTOGRAM W/LOWER EXTREMITY;  Surgeon: Cephus Shelling, MD;  Location: [facility];  Service: Cardiovascular;  Laterality: N/A;   ABDOMINAL EXPLORATION SURGERY  02/2004   w/LOA/notes 12/10/2010; small bowel obstruction repair with adhesiolysis    BACK SURGERY     CARDIAC CATHETERIZATION     2 heart caths in the past.  One in 2000s showed one ulcerated plaque  Rx medically; Second at [facility] /notes 09/05/2016   CATARACT EXTRACTION W/ INTRAOCULAR LENS  IMPLANT, BILATERAL Bilateral 2000s   COLECTOMY     hx of remote ileum resection due to bleeding   COLONOSCOPY WITH PROPOFOL N/A 01/22/2018   Procedure: COLONOSCOPY WITH PROPOFOL;  Surgeon: Iva Boop, MD;  Location: [facility]OS[facility];  Service: Endoscopy;  Laterality: N/A;   CORONARY ANGIOPLASTY  WITH STENT PLACEMENT  09/10/2016   CORONARY STENT INTERVENTION N/A 09/10/2016   Procedure: Coronary Stent Intervention;  Surgeon: Kathleene Hazel, MD;  Location: [facility] INVASIVE CV LAB;  Service: Cardiovascular;  Laterality: N/A;  Distal RCA 4.0x16 Synergy   ESOPHAGOGASTRODUODENOSCOPY (EGD) WITH PROPOFOL N/A 01/22/2018   Procedure: ESOPHAGOGASTRODUODENOSCOPY (EGD) WITH PROPOFOL;  Surgeon: Iva Boop, MD;  Location: [facility] ENDOS[facility];  Service: Endoscopy;  Laterality: N/A;   EYE SURGERY     FALSE ANEURYSM REPAIR Left 08/22/2020   Procedure: EXCLUSION OF LEFT POPLITEAL ARTERY ANEURYSM;  Surgeon: Cephus Shelling, MD;  Location: [facility] OR;  Service: Vascular;  Laterality: Left;   FEMORAL EMBOLOECTOMY Left 07/2000   with left leg ischemia; Dr. Hart [place], vascular   FEMORAL-POPLITEAL BYPASS GRAFT Bilateral 08/22/2020   Procedure: LEFT FEMORAL-POSTERIOR TIBIAL ARTERY BYPASS GRAFT USING RIGHT GREATER NONREVERSED SAPHENOUS VEIN GRAFT;  Surgeon: Cephus Shelling, MD;  Location: [facility] OR;  Service: Vascular;  Laterality: Bilateral;   GANGLION CYST  EXCISION Right    "wrist"; Dr. Teressa Senter   HOT HEMOSTASIS N/A 01/22/2018   Procedure: HOT HEMOSTASIS (ARGON PLASMA COAGULATION/BICAP);  Surgeon: Iva Boop, MD;  Location: [facility];  Service: Endoscopy;  Laterality: N/A;   LOOP RECORDER INSERTION N/A 04/25/2019   Procedure: LOOP RECORDER INSERTION;  Surgeon: Duke Salvia, MD;  Location: [facility] INVASIVE CV LAB;  Service: Cardiovascular;  Laterality: N/A;   LOOP RECORDER REMOVAL N/A 07/01/2019   Procedure: LOOP RECORDER REMOVAL;  Surgeon: Duke Salvia, MD;  Location: [facility] INVASIVE CV LAB;  Service: Cardiovascular;  Laterality: N/A;   LUMBAR LAMINECTOMY  1972   Dr. Fannie Knee   PACEMAKER IMPLANT N/A 07/01/2019   Procedure: PACEMAKER IMPLANT;  Surgeon: Duke Salvia, MD;  Location: [facility] INVASIVE CV LAB;  Service: Cardiovascular;  Laterality: N/A;   PERIPHERAL VASCULAR BALLOON ANGIOPLASTY Left 10/24/2021   Procedure: PERIPHERAL VASCULAR BALLOON ANGIOPLASTY;  Surgeon: Cephus Shelling, MD;  Location: [facility];  Service: Cardiovascular;  Laterality: Left;   RIGHT/LEFT HEART CATH AND CORONARY ANGIOGRAPHY N/A 09/10/2016   Procedure: Right/Left Heart Cath and Coronary Angiography;  Surgeon: Kathleene Hazel, MD;  Location: [facility] INVASIVE CV LAB;  Service: Cardiovascular;  Laterality: N/A;   TONSILLECTOMY      Social History   Socioeconomic History   Marital status: Widowed    Spouse name: Not on file   Number of children: Not on file   Years of education: Not on file   Highest education level: Not on file  Occupational History   Occupation: disabled veteran, [employer]    Employer: RETIRED  Tobacco Use   Smoking status: Former    Packs/day: 2.50    Years: 40.00    Additional pack years: 0.00    Total pack years: 100.00    Types: Cigarettes, Pipe, Cigars    Quit date: 07/28/1998    Years since quitting: 24.3    Passive exposure: Never   Smokeless tobacco: Never  Vaping Use   Vaping Use: Never used  Substance and Sexual  Activity   Alcohol use: No    Alcohol/week: 0.0 standard drinks of alcohol   Drug use: No   Sexual activity: Not Currently  Other Topics Concern   Not on file  Social History Narrative   Lives in the home with his wife   Sees [place] every 6 month for meds.   Denies asbestos exposure   Social Determinants of Health   Financial Resource Strain: Low Risk  (01/17/2022)  Overall Financial Resource Strain (CARDIA)    Difficulty of Paying Living Expenses: Not hard at all  Food Insecurity: No Food Insecurity (03/05/2022)   Hunger Vital Sign    Worried About Running Out of Food in the Last Year: Never true    Ran Out of Food in the Last Year: Never true  Transportation Needs: No Transportation Needs (03/05/2022)   PRAPARE - Administrator, Civil Service (Medical): No    Lack of Transportation (Non-Medical): No  Physical Activity: Inactive (01/17/2022)   Exercise Vital Sign    Days of Exercise per Week: 0 days    Minutes of Exercise per Session: 0 min  Stress: No Stress Concern Present (01/17/2022)   [employer] of Occupational Health - Occupational Stress Questionnaire    Feeling of Stress : Not at all  Social Connections: Socially Isolated (01/17/2022)   Social Connection and Isolation Panel [NHANES]    Frequency of Communication with Friends and Family: More than three times a week    Frequency of Social Gatherings with Friends and Family: Once a week    Attends Religious Services: Never    Database administrator or Organizations: No    Attends Banker Meetings: Never    Marital Status: Widowed  Intimate Partner Violence: Not At Risk (01/17/2022)   Humiliation, Afraid, Rape, and Kick questionnaire    Fear of Current or Ex-Partner: No    Emotionally Abused: No    Physically Abused: No    Sexually Abused: No    Family History  Problem Relation Age of Onset   Other Mother        gun shot    ROS: no fevers or chills, productive cough, hemoptysis,  dysphasia, odynophagia, melena, hematochezia, dysuria, hematuria, rash, seizure activity, orthopnea, PND, claudication. Remaining systems are negative.  Physical Exam: Well-developed well-nourished in no acute distress.  Skin is warm and dry.  HEENT is normal.  Neck is supple.  Chest diminished breath sounds throughout Cardiovascular exam is regular rate and rhythm.  Abdominal exam nontender or distended. No masses palpated. Extremities show trace edema. neuro grossly intact  ECG-normal sinus rhythm at a rate of 60, first-degree AV block, left bundle branch block.  Personally reviewed  A/P  1 thoracic/abdominal/iliac aneurysms-followed by vascular surgery.  Patient will need follow-up CTA March 2025.  2 coronary artery disease-doing well with no chest pain.  Continue statin.  Continue Plavix.  3 paroxysmal atrial fibrillation-patient remains rhythm.  Anticoagulation has been discontinued secondary to history of recurrent hemoptysis, thrombocytopenia and history of ITP.  4 hyperlipidemia-continue statin.  Check lipids and liver.  5 hypertension-blood pressure elevated: Increase losartan to 100 mg daily and follow blood pressure.  Check potassium and renal function in 1 week.  6 history of cardiomyopathy-LV function has improved on most recent echocardiogram.  7 chronic combined systolic/diastolic congestive heart failure-continue diuretic as needed.  He is euvolemic.  8 COPD-patient is on home oxygen.  He is followed closely by pulmonary.  9 ITP-managed by hematology.  10 history of pacemaker-we will arrange follow-up with electrophysiology.  Olga Millers, MD

## 2022-12-19 NOTE — Progress Notes (Signed)
Patient Care Team: Myrlene Broker, MD as PCP - General (Internal Medicine) Jens Som Madolyn Frieze, MD as PCP - Cardiology (Cardiology) Ernesto Rutherford, MD as Consulting Physician (Ophthalmology) Kalman Shan, MD as Consulting Physician (Pulmonary Disease) System, Provider Not In as Consulting Physician Casimer Lanius, MD as Consulting Physician (Rheumatology) Oretha Milch, MD as Consulting Physician (Pulmonary Disease) Lewayne Bunting, MD as Consulting Physician (Cardiology)  DIAGNOSIS: No diagnosis found.  SUMMARY OF ONCOLOGIC HISTORY: Oncology History   No history exists.    CHIEF COMPLIANT:   INTERVAL HISTORY: [name] is a   ALLERGIES:  has No Known Allergies.  MEDICATIONS:  Current Outpatient Medications  Medication Sig Dispense Refill   acetaminophen (TYLENOL) 500 MG tablet Take 1,000 mg by mouth every 6 (six) hours as needed for mild pain.     albuterol (PROVENTIL) (2.5 MG/3ML) 0.083% nebulizer solution Take 3 mLs (2.5 mg total) by nebulization every 6 (six) hours as needed for wheezing or shortness of breath. 120 mL 12   albuterol (VENTOLIN HFA) 108 (90 Base) MCG/ACT inhaler Inhale 2 puffs into the lungs every 6 (six) hours as needed for wheezing or shortness of breath. 18 g 3   atorvastatin (LIPITOR) 40 MG tablet Take 1 tablet (40 mg total) by mouth daily. 90 tablet 3   Carboxymethylcellul-Glycerin (LUBRICATING EYE DROPS OP) Place 1 drop into both eyes 4 (four) times daily as needed (dry eyes).      Cholecalciferol (VITAMIN D3) 2000 units capsule Take 2,000 Units by mouth daily.      clopidogrel (PLAVIX) 75 MG tablet TAKE 1 TABLET(75 MG) BY MOUTH DAILY 30 tablet 11   Continuous Blood Gluc Receiver (DEXCOM G7 RECEIVER) DEVI Use to check blood sugars 3x a day. DX CODE: E11.69, E78.5 1 each 0   Continuous Blood Gluc Sensor (DEXCOM G7 SENSOR) MISC Use to check blood sugars 3x a day. DX CODE: E11.69, E78.5 3 each 4   Continuous Blood Gluc Transmit  (DEXCOM G6 TRANSMITTER) MISC by Does not apply route.     cyanocobalamin (VITAMIN B12) 500 MCG tablet TAKE ONE TABLET BY MOUTH DAILY FOR LOW B12 LEVEL     empagliflozin (JARDIANCE) 10 MG TABS tablet Take 5 mg by mouth daily. Filled at [place].     ferrous sulfate 324 (65 Fe) MG TBEC Take 1 tablet by mouth daily.     fluticasone (FLONASE) 50 MCG/ACT nasal spray Place 1 spray into both nostrils daily. (Patient taking differently: Place 1 spray into both nostrils daily as needed for allergies.) 18.2 mL 2   insulin glargine (LANTUS) 100 UNIT/ML injection Inject 30 Units into the skin at bedtime.     leflunomide (ARAVA) 10 MG tablet TAKE ONE TABLET BY MOUTH DAILY FOR RHEUMATOID ARTHRITIS     levothyroxine (SYNTHROID) 175 MCG tablet Take 175 mcg by mouth daily before breakfast.     losartan (COZAAR) 50 MG tablet Take 1 tablet (50 mg total) by mouth daily. 90 tablet 3   metFORMIN (GLUCOPHAGE) 500 MG tablet Take 500-1,000 mg by mouth 2 (two) times daily with a meal. Take 1 tablet (500 mg) by mouth in the morning & take 2 tablets (1000 mg) by mouth in the evening.     Multiple Vitamins-Minerals (MEGA MULTIVITAMIN FOR MEN PO) Take 1 tablet by mouth daily.     OXYGEN Inhale 2 L into the lungs continuous.     predniSONE (DELTASONE) 5 MG tablet TAKE 1 TABLET(5 MG) BY MOUTH DAILY WITH BREAKFAST (Patient taking differently:  Take 5 mg by mouth daily with breakfast.) 90 tablet 3   pregabalin (LYRICA) 75 MG capsule Take 1 capsule (75 mg total) by mouth 2 (two) times daily.     Semaglutide (OZEMPIC, 1 MG/DOSE, [place]) Inject 1 mg into the skin every Friday.     tamsulosin (FLOMAX) 0.4 MG CAPS capsule Take 1 capsule (0.4 mg total) by mouth at bedtime. 30 capsule 0   Tiotropium Bromide-Olodaterol (STIOLTO RESPIMAT) 2.5-2.5 MCG/ACT AERS Inhale 2 puffs into the lungs daily. 4 g 0   No current facility-administered medications for this visit.    PHYSICAL EXAMINATION: ECOG PERFORMANCE STATUS: {CHL ONC ECOG  PS:[phone]}  There were no vitals filed for this visit. There were no vitals filed for this visit.  BREAST:*** No palpable masses or nodules in either right or left breasts. No palpable axillary supraclavicular or infraclavicular adenopathy no breast tenderness or nipple discharge. (exam performed in the presence of a chaperone)  LABORATORY DATA:  I have reviewed the data as listed    Latest Ref Rng & Units 03/13/2022    7:36 AM 10/24/2021    8:58 AM 10/16/2021   10:47 AM  CMP  Glucose 70 - 99 mg/dL 89  83  409   BUN 8 - 23 mg/dL 30  18  20    Creatinine 0.61 - 1.24 mg/dL 8.11  9.14  7.82   Sodium 135 - 145 mmol/L 142  144  140   Potassium 3.5 - 5.1 mmol/L 4.4  3.9  4.8   Chloride 98 - 111 mmol/L 104  107  101   CO2 20 - 29 mmol/L   19   Calcium 8.6 - 10.2 mg/dL   9.5     Lab Results  Component Value Date   WBC 6.7 10/15/2022   HGB 13.1 10/15/2022   HCT 41.6 10/15/2022   MCV 85.4 10/15/2022   PLT 87 (L) 10/15/2022   NEUTROABS 4.7 10/15/2022    ASSESSMENT & PLAN:  No problem-specific Assessment & Plan notes found for this encounter.    No orders of the defined types were placed in this encounter.  The patient has a good understanding of the overall plan. he agrees with it. he will call with any problems that may develop before the next visit here. Total time spent: 30 mins including face to face time and time spent for planning, charting and co-ordination of care   Sherlyn Lick, CMA 12/19/22    I Janan Ridge am acting as a Neurosurgeon for [employer]  ***

## 2022-12-24 ENCOUNTER — Ambulatory Visit: Payer: Medicare PPO | Attending: Cardiology | Admitting: Cardiology

## 2022-12-24 ENCOUNTER — Encounter: Payer: Self-pay | Admitting: Cardiology

## 2022-12-24 ENCOUNTER — Telehealth: Payer: Self-pay | Admitting: Internal Medicine

## 2022-12-24 VITALS — BP 148/72 | HR 60 | Ht 74.0 in | Wt 186.0 lb

## 2022-12-24 DIAGNOSIS — E7800 Pure hypercholesterolemia, unspecified: Secondary | ICD-10-CM

## 2022-12-24 DIAGNOSIS — I712 Thoracic aortic aneurysm, without rupture, unspecified: Secondary | ICD-10-CM

## 2022-12-24 DIAGNOSIS — I2510 Atherosclerotic heart disease of native coronary artery without angina pectoris: Secondary | ICD-10-CM

## 2022-12-24 DIAGNOSIS — I480 Paroxysmal atrial fibrillation: Secondary | ICD-10-CM

## 2022-12-24 DIAGNOSIS — I10 Essential (primary) hypertension: Secondary | ICD-10-CM | POA: Diagnosis not present

## 2022-12-24 MED ORDER — LOSARTAN POTASSIUM 100 MG PO TABS
100.0000 mg | ORAL_TABLET | Freq: Every day | ORAL | 3 refills | Status: DC
Start: 2022-12-24 — End: 2023-12-09

## 2022-12-24 NOTE — Patient Instructions (Signed)
Medication Instructions:   INCREASE LOSARTAN TO 100 MG ONCE DAILY= 2 OF THE 50 MG TABLETS ONCE DAILY  *If you need a refill on your cardiac medications before your next appointment, please call your pharmacy*   Lab Work:  Your physician recommends that you return for lab work in: ONE WEEK -Insurance claims handler Lab  Located on the 3 rd floor in [address] Hours-Monday - Friday 8 am-11:30 am and 1 pm -4 pm   If you have labs (blood work) drawn today and your tests are completely normal, you will receive your results only by: MyChart Message (if you have MyChart) OR A paper copy in the mail If you have any lab test that is abnormal or we need to change your treatment, we will call you to review the results.   Follow-Up: At [facility], you and your health needs are our priority.  As part of our continuing mission to provide you with exceptional heart care, we have created designated Provider Care Teams.  These Care Teams include your primary Cardiologist (physician) and Advanced Practice Providers (APPs -  Physician Assistants and Nurse Practitioners) who all work together to provide you with the care you need, when you need it.  We recommend signing up for the patient portal called "MyChart".  Sign up information is provided on this After Visit Summary.  MyChart is used to connect with patients for Virtual Visits (Telemedicine).  Patients are able to view lab/test results, encounter notes, upcoming appointments, etc.  Non-urgent messages can be sent to your provider as well.   To learn more about what you can do with MyChart, go to [email or web address].    Your next appointment:   6 month(s)  Provider:   Olga Millers, MD

## 2022-12-24 NOTE — Telephone Encounter (Signed)
  Pt is requesting to switch from Dr. Elberta Fortis to Dr. Graciela Husbands. [place] to [place] office.

## 2022-12-24 NOTE — Addendum Note (Signed)
Addended by: Freddi Starr on: 12/24/2022 01:25 PM   Modules accepted: Orders

## 2022-12-25 ENCOUNTER — Inpatient Hospital Stay: Payer: Medicare PPO

## 2022-12-25 ENCOUNTER — Inpatient Hospital Stay: Payer: Medicare PPO | Attending: Hematology and Oncology | Admitting: Hematology and Oncology

## 2022-12-25 VITALS — BP 167/68 | HR 65 | Temp 98.1°F | Resp 19 | Ht 74.0 in | Wt 188.8 lb

## 2022-12-25 DIAGNOSIS — D693 Immune thrombocytopenic purpura: Secondary | ICD-10-CM

## 2022-12-25 DIAGNOSIS — Z7952 Long term (current) use of systemic steroids: Secondary | ICD-10-CM | POA: Insufficient documentation

## 2022-12-25 LAB — CBC WITH DIFFERENTIAL (CANCER CENTER ONLY)
Abs Immature Granulocytes: 0.02 10*3/uL (ref 0.00–0.07)
Basophils Absolute: 0.1 10*3/uL (ref 0.0–0.1)
Basophils Relative: 1 %
Eosinophils Absolute: 0.2 10*3/uL (ref 0.0–0.5)
Eosinophils Relative: 2 %
HCT: 39.2 % (ref 39.0–52.0)
Hemoglobin: 12.5 g/dL — ABNORMAL LOW (ref 13.0–17.0)
Immature Granulocytes: 0 %
Lymphocytes Relative: 12 %
Lymphs Abs: 0.8 10*3/uL (ref 0.7–4.0)
MCH: 27.2 pg (ref 26.0–34.0)
MCHC: 31.9 g/dL (ref 30.0–36.0)
MCV: 85.4 fL (ref 80.0–100.0)
Monocytes Absolute: 0.6 10*3/uL (ref 0.1–1.0)
Monocytes Relative: 9 %
Neutro Abs: 4.9 10*3/uL (ref 1.7–7.7)
Neutrophils Relative %: 76 %
Platelet Count: 93 10*3/uL — ABNORMAL LOW (ref 150–400)
RBC: 4.59 MIL/uL (ref 4.22–5.81)
RDW: 14.8 % (ref 11.5–15.5)
WBC Count: 6.5 10*3/uL (ref 4.0–10.5)
nRBC: 0 % (ref 0.0–0.2)

## 2022-12-25 NOTE — Assessment & Plan Note (Signed)
Relapsed acute ITP Prior treatment: Prednisone started 07/05/2019 completed 10/23/2019 Lab review:  02/08/2020: Platelet count 37 with blood with expectoration started on prednisone 02/15/2020: Platelet count 98 03/19/2020: Platelet count: 68 04/16/2020: Platelet count 64 (prednisone dose lowered to 5 mg) 06/25/2020: Platelets 38 07/09/2020: Platelets 73 (continue with 5 mg prednisone) 08/26/2020: Platelets 73 3/22: Platelets 67 (prednisone discontinued) 11/01/2020: Platelets 37 (prednisone 5 mg restarted) 11/29/2020: Platelets 68 03/28/2021: Platelets 54 08/29/2021: Platelets 101 03/27/2022: Platelets 85 06/26/2022: Platelets 86 10/15/2022: Platelets 87 stable 12/25/2022: Platelets 96   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  He has a stent plan for the right leg coming up.   Continue with 5 mg of prednisone Recheck labs in 4 months and follow-up

## 2022-12-26 ENCOUNTER — Ambulatory Visit (INDEPENDENT_AMBULATORY_CARE_PROVIDER_SITE_OTHER): Payer: Medicare PPO

## 2022-12-26 DIAGNOSIS — I255 Ischemic cardiomyopathy: Secondary | ICD-10-CM

## 2022-12-26 LAB — CUP PACEART REMOTE DEVICE CHECK
Battery Remaining Longevity: 115 mo
Battery Voltage: 3 V
Brady Statistic AP VP Percent: 41.86 %
Brady Statistic AP VS Percent: 1.75 %
Brady Statistic AS VP Percent: 21.39 %
Brady Statistic AS VS Percent: 35 %
Brady Statistic RA Percent Paced: 45.02 %
Brady Statistic RV Percent Paced: 63.25 %
Date Time Interrogation Session: 20240530211913
Implantable Lead Connection Status: 753985
Implantable Lead Connection Status: 753985
Implantable Lead Implant Date: 20201204
Implantable Lead Implant Date: 20201204
Implantable Lead Location: 753859
Implantable Lead Location: 753860
Implantable Lead Model: 5076
Implantable Lead Model: 5076
Implantable Pulse Generator Implant Date: 20201204
Lead Channel Impedance Value: 323 Ohm
Lead Channel Impedance Value: 323 Ohm
Lead Channel Impedance Value: 380 Ohm
Lead Channel Impedance Value: 399 Ohm
Lead Channel Pacing Threshold Amplitude: 0.5 V
Lead Channel Pacing Threshold Amplitude: 0.5 V
Lead Channel Pacing Threshold Pulse Width: 0.4 ms
Lead Channel Pacing Threshold Pulse Width: 0.4 ms
Lead Channel Sensing Intrinsic Amplitude: 1.75 mV
Lead Channel Sensing Intrinsic Amplitude: 1.75 mV
Lead Channel Sensing Intrinsic Amplitude: 7.25 mV
Lead Channel Sensing Intrinsic Amplitude: 7.25 mV
Lead Channel Setting Pacing Amplitude: 1.5 V
Lead Channel Setting Pacing Amplitude: 2.5 V
Lead Channel Setting Pacing Pulse Width: 0.4 ms
Lead Channel Setting Sensing Sensitivity: 2 mV
Zone Setting Status: 755011
Zone Setting Status: 755011

## 2023-01-02 DIAGNOSIS — E7800 Pure hypercholesterolemia, unspecified: Secondary | ICD-10-CM | POA: Diagnosis not present

## 2023-01-02 DIAGNOSIS — I10 Essential (primary) hypertension: Secondary | ICD-10-CM | POA: Diagnosis not present

## 2023-01-03 LAB — LIPID PANEL
Chol/HDL Ratio: 3.4 ratio (ref 0.0–5.0)
Cholesterol, Total: 115 mg/dL (ref 100–199)
HDL: 34 mg/dL — ABNORMAL LOW (ref >39)
LDL Chol Calc (NIH): 60 mg/dL (ref 0–99)
Triglycerides: 111 mg/dL (ref 0–149)
VLDL Cholesterol Cal: 21 mg/dL (ref 5–40)

## 2023-01-03 LAB — COMPREHENSIVE METABOLIC PANEL
ALT: 14 IU/L (ref 0–44)
AST: 18 IU/L (ref 0–40)
Albumin/Globulin Ratio: 2 (ref 1.2–2.2)
Albumin: 4.1 g/dL (ref 3.7–4.7)
Alkaline Phosphatase: 83 IU/L (ref 44–121)
BUN/Creatinine Ratio: 23 (ref 10–24)
BUN: 27 mg/dL (ref 8–27)
Bilirubin Total: 0.5 mg/dL (ref 0.0–1.2)
CO2: 23 mmol/L (ref 20–29)
Calcium: 9 mg/dL (ref 8.6–10.2)
Chloride: 104 mmol/L (ref 96–106)
Creatinine, Ser: 1.19 mg/dL (ref 0.76–1.27)
Globulin, Total: 2.1 g/dL (ref 1.5–4.5)
Glucose: 120 mg/dL — ABNORMAL HIGH (ref 70–99)
Potassium: 4.7 mmol/L (ref 3.5–5.2)
Sodium: 140 mmol/L (ref 134–144)
Total Protein: 6.2 g/dL (ref 6.0–8.5)
eGFR: 61 mL/min/{1.73_m2} (ref >59)

## 2023-01-06 ENCOUNTER — Ambulatory Visit (INDEPENDENT_AMBULATORY_CARE_PROVIDER_SITE_OTHER): Payer: Medicare PPO

## 2023-01-06 VITALS — Ht 74.0 in | Wt 188.0 lb

## 2023-01-06 DIAGNOSIS — Z0000 Encounter for general adult medical examination without abnormal findings: Secondary | ICD-10-CM

## 2023-01-06 NOTE — Progress Notes (Signed)
Subjective:   [name] is a 82 y.o. male who presents for Medicare Annual/Subsequent preventive examination.  Review of Systems    Virtual Visit via Telephone Note  I connected with  [name] on 01/06/23 at  9:00 AM EDT by telephone and verified that I am speaking with the correct person using two identifiers.  Location: Patient: Home Provider: Office Persons participating in the virtual visit: patient/Nurse Health Advisor   I discussed the limitations, risks, security and privacy concerns of performing an evaluation and management service by telephone and the availability of in person appointments. The patient expressed understanding and agreed to proceed.  Interactive audio and video telecommunications were attempted between this nurse and patient, however failed, due to patient having technical difficulties OR patient did not have access to video capability.  We continued and completed visit with audio only.  Some vital signs may be absent or patient reported.   Tillie Rung, LPN  Cardiac Risk Factors include: advanced age (>26men, >47 women);male gender;diabetes mellitus;hypertension     Objective:    Today's Vitals   01/06/23 0903 01/06/23 0906  Weight: 188 lb (85.3 kg)   Height: 6\' 2"  (1.88 m)   PainSc:  0-No pain   Body mass index is 24.14 kg/m.     01/06/2023    9:14 AM 12/25/2022   10:31 AM 06/26/2022   11:07 AM 03/13/2022    7:49 AM 01/17/2022   10:36 AM 08/22/2020    9:00 AM 08/21/2020   10:19 AM  Advanced Directives  Does Patient Have a Medical Advance Directive? Yes Yes Yes No Yes Yes Yes  Type of Estate agent of [place];Living will Healthcare Power of [employer] Power of [employer] Power of [employer] Power of Attorney Healthcare Power of Attorney  Does patient want to make changes to medical advance directive?  No - Patient declined No - Patient declined  No - Patient declined  No -  Patient declined  Copy of Healthcare Power of Attorney in Chart? No - copy requested No - copy requested No - copy requested  No - copy requested No - copy requested No - copy requested  Would patient like information on creating a medical advance directive?    Yes (MAU/Ambulatory/Procedural Areas - Information given)  No - Patient declined     Current Medications (verified) Outpatient Encounter Medications as of 01/06/2023  Medication Sig   acetaminophen (TYLENOL) 500 MG tablet Take 1,000 mg by mouth every 6 (six) hours as needed for mild pain.   albuterol (PROVENTIL) (2.5 MG/3ML) 0.083% nebulizer solution Take 3 mLs (2.5 mg total) by nebulization every 6 (six) hours as needed for wheezing or shortness of breath.   albuterol (VENTOLIN HFA) 108 (90 Base) MCG/ACT inhaler Inhale 2 puffs into the lungs every 6 (six) hours as needed for wheezing or shortness of breath.   atorvastatin (LIPITOR) 40 MG tablet Take 1 tablet (40 mg total) by mouth daily.   Carboxymethylcellul-Glycerin (LUBRICATING EYE DROPS OP) Place 1 drop into both eyes 4 (four) times daily as needed (dry eyes).    Cholecalciferol (VITAMIN D3) 2000 units capsule Take 2,000 Units by mouth daily.    clopidogrel (PLAVIX) 75 MG tablet TAKE 1 TABLET(75 MG) BY MOUTH DAILY   Continuous Blood Gluc Receiver (DEXCOM G7 RECEIVER) DEVI Use to check blood sugars 3x a day. DX CODE: E11.69, E78.5   Continuous Blood Gluc Sensor (DEXCOM G7 SENSOR) MISC Use to check blood sugars  3x a day. DX CODE: E11.69, E78.5   Continuous Blood Gluc Transmit (DEXCOM G6 TRANSMITTER) MISC by Does not apply route.   cyanocobalamin (VITAMIN B12) 500 MCG tablet TAKE ONE TABLET BY MOUTH DAILY FOR LOW B12 LEVEL   empagliflozin (JARDIANCE) 10 MG TABS tablet Take 5 mg by mouth daily. Filled at [place].   ferrous sulfate 324 (65 Fe) MG TBEC Take 1 tablet by mouth daily.   fluticasone (FLONASE) 50 MCG/ACT nasal spray Place 1 spray into both nostrils daily. (Patient taking differently:  Place 1 spray into both nostrils daily as needed for allergies.)   imiquimod (ALDARA) 5 % cream APPLY THIN LAYER TO AFFECTED AREA THREE TIMES A WEEK APPLY TO WARTS 3-5 TIMES PER WEEK FOR 6 WEEKS OR UNTIL WARTS DISAPPEAR   insulin glargine (LANTUS) 100 UNIT/ML injection Inject 30 Units into the skin at bedtime.   leflunomide (ARAVA) 10 MG tablet TAKE ONE TABLET BY MOUTH DAILY FOR RHEUMATOID ARTHRITIS   levothyroxine (SYNTHROID) 125 MCG tablet TAKE ONE TABLET BY MOUTH DAILY FOR THYROID   losartan (COZAAR) 100 MG tablet Take 1 tablet (100 mg total) by mouth daily. (Patient taking differently: Take 50 mg by mouth daily.)   metFORMIN (GLUCOPHAGE) 500 MG tablet Take 500-1,000 mg by mouth 2 (two) times daily with a meal. Take 1 tablet (500 mg) by mouth in the morning & take 2 tablets (1000 mg) by mouth in the evening.   Multiple Vitamins-Minerals (MEGA MULTIVITAMIN FOR MEN PO) Take 1 tablet by mouth daily.   OXYGEN Inhale 2 L into the lungs continuous.   predniSONE (DELTASONE) 5 MG tablet TAKE 1 TABLET(5 MG) BY MOUTH DAILY WITH BREAKFAST (Patient taking differently: Take 5 mg by mouth daily with breakfast.)   pregabalin (LYRICA) 75 MG capsule Take 1 capsule (75 mg total) by mouth 2 (two) times daily.   Semaglutide (OZEMPIC, 1 MG/DOSE, [place]) Inject 1 mg into the skin every Friday.   sertraline (ZOLOFT) 100 MG tablet TAKE ONE-HALF TABLET BY MOUTH EVERY MORNING AFTER BREAKFAST FOR MENTAL HEALTH   tamsulosin (FLOMAX) 0.4 MG CAPS capsule Take 1 capsule (0.4 mg total) by mouth at bedtime.   Tiotropium Bromide-Olodaterol (STIOLTO RESPIMAT) 2.5-2.5 MCG/ACT AERS Inhale 2 puffs into the lungs daily.   traZODone (DESYREL) 50 MG tablet TAKE ONE TABLET BY MOUTH AT BEDTIME FOR INSOMNIA   No facility-administered encounter medications on file as of 01/06/2023.    Allergies (verified) Patient has no known allergies.   History: Past Medical History:  Diagnosis Date   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   ablated    Anxiety    Aortic aneurysm (HCC) 09/02/2017   BENIGN PROSTATIC HYPERTROPHY 11/23/2009   Cardiomyopathy (HCC) 08/28/2016   Chronic systolic CHF (congestive heart failure) (HCC) 09/02/2017   COPD (chronic obstructive pulmonary disease) (HCC)    CORONARY ARTERY DISEASE 11/23/2009   DECREASED HEARING, 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   DEGENERATIVE JOINT DISEASE 11/23/2009   DEPRESSION 11/23/2009   FATIGUE 11/23/2009   GAIT DISTURBANCE 12/10/2009   HEMOPTYSIS UNSPECIFIED 05/07/2010   High cholesterol    HYPERTENSION 07/30/2009   HYPOTHYROIDISM 07/30/2009   Ischemic cardiomyopathy 09/02/2017   LUMBAR RADICULOPATHY, 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   On home oxygen therapy    "2-3L; 24/7" (09/10/2016)   OSA on CPAP    Pneumonia    PTSD (post-traumatic stress disorder) 03/10/2012   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   RA (rheumatoid arthritis) (HCC) 06/11/2011   "qwhere" (09/10/2016)   RESPIRATORY FAILURE, CHRONIC 07/31/2009   Scleritis of both eyes 03/17/2014  Thrombocytopenia (HCC)    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   Type II diabetes mellitus [facility])    Past Surgical History:  Procedure Laterality Date   ABDOMINAL AORTIC ANEURYSM REPAIR  07/2002   [name] 12/10/2010   ABDOMINAL AORTOGRAM W/LOWER EXTREMITY N/A 08/16/2020   Procedure: ABDOMINAL AORTOGRAM W/LOWER EXTREMITY;  Surgeon: Cephus Shelling, MD;  Location: [facility] INVASIVE CV LAB;  Service: Cardiovascular;  Laterality: N/A;   ABDOMINAL AORTOGRAM W/LOWER EXTREMITY N/A 10/24/2021   Procedure: ABDOMINAL AORTOGRAM W/LOWER EXTREMITY;  Surgeon: Cephus Shelling, MD;  Location: [facility]VAS[facility] CV [facility];  Service: Cardiovascular;  Laterality: N/A;   ABDOMINAL AORTOGRAM W/LOWER EXTREMITY N/A 03/13/2022   Procedure: ABDOMINAL AORTOGRAM W/LOWER EXTREMITY;  Surgeon: Cephus Shelling, MD;  Location: [facility];  Service: Cardiovascular;  Laterality: N/A;   ABDOMINAL EXPLORATION SURGERY  02/2004   w/LOA/notes 12/10/2010; small bowel obstruction repair with adhesiolysis    BACK SURGERY     CARDIAC  CATHETERIZATION     2 heart caths in the past.  One in 2000s showed one ulcerated plaque  Rx medically; [facility] /notes 09/05/2016   CATARACT EXTRACTION W/ INTRAOCULAR LENS  IMPLANT, BILATERAL Bilateral 2000s   COLECTOMY     hx of remote ileum resection due to bleeding   COLONOSCOPY WITH PROPOFOL N/A 01/22/2018   Procedure: COLONOSCOPY WITH PROPOFOL;  Surgeon: Iva Boop, MD;  Location: [facility] ENDOSCOPY;  Service: Endoscopy;  Laterality: N/A;   CORONARY ANGIOPLASTY WITH STENT PLACEMENT  09/10/2016   CORONARY STENT INTERVENTION N/A 09/10/2016   Procedure: Coronary Stent Intervention;  Surgeon: Kathleene Hazel, MD;  Location: [facility];  Service: Cardiovascular;  Laterality: N/A;  Distal RCA 4.0x16 Synergy   ESOPHAGOGASTRODUODENOSCOPY (EGD) WITH PROPOFOL N/A 01/22/2018   Procedure: ESOPHAGOGASTRODUODENOSCOPY (EGD) WITH PROPOFOL;  Surgeon: Iva Boop, MD;  Location: [facility] ENDOSCOPY;  Service: Endoscopy;  Laterality: N/A;   EYE SURGERY     FALSE ANEURYSM REPAIR Left 08/22/2020   Procedure: EXCLUSION OF LEFT POPLITEAL ARTERY ANEURYSM;  Surgeon: Cephus Shelling, MD;  Location: [facility] OR;  Service: Vascular;  Laterality: Left;   FEMORAL EMBOLOECTOMY Left 07/2000   with left leg ischemia; Dr. Hart [place], vascular   FEMORAL-POPLITEAL BYPASS GRAFT Bilateral 08/22/2020   Procedure: LEFT FEMORAL-POSTERIOR TIBIAL ARTERY BYPASS GRAFT USING RIGHT GREATER NONREVERSED SAPHENOUS VEIN GRAFT;  Surgeon: Cephus Shelling, MD;  Location: [facility] OR;  Service: Vascular;  Laterality: Bilateral;   GANGLION CYST EXCISION Right    "wrist"; Dr. Teressa Senter   HOT HEMOSTASIS N/A 01/22/2018   Procedure: HOT HEMOSTASIS (ARGON PLASMA COAGULATION/BICAP);  Surgeon: Iva Boop, MD;  Location: [facility];  Service: Endoscopy;  Laterality: N/A;   LOOP RECORDER INSERTION N/A 04/25/2019   Procedure: LOOP RECORDER INSERTION;  Surgeon: Duke Salvia, MD;  Location: [facility] INVASIVE CV LAB;  Service: Cardiovascular;   Laterality: N/A;   LOOP RECORDER REMOVAL N/A 07/01/2019   Procedure: LOOP RECORDER REMOVAL;  Surgeon: Duke Salvia, MD;  Location: [facility] INVASIVE CV LAB;  Service: Cardiovascular;  Laterality: N/A;   LUMBAR LAMINECTOMY  1972   Dr. Fannie Knee   PACEMAKER IMPLANT N/A 07/01/2019   Procedure: PACEMAKER IMPLANT;  Surgeon: Duke Salvia, MD;  Location: [facility] INVASIVE CV LAB;  Service: Cardiovascular;  Laterality: N/A;   PERIPHERAL VASCULAR BALLOON ANGIOPLASTY Left 10/24/2021   Procedure: PERIPHERAL VASCULAR BALLOON ANGIOPLASTY;  Surgeon: Cephus Shelling, MD;  [facility];  Service: Cardiovascular;  Laterality: Left;   RIGHT/LEFT HEART CATH AND CORONARY ANGIOGRAPHY N/A  09/10/2016   Procedure: Right/Left Heart Cath and Coronary Angiography;  Surgeon: Kathleene Hazel, MD;  Location: [facility] INVASIVE CV LAB;  Service: Cardiovascular;  Laterality: N/A;   TONSILLECTOMY     Family History  Problem Relation Age of Onset   Other Mother        gun shot   Social History   Socioeconomic History   Marital status: Widowed    Spouse name: Not on file   Number of children: Not on file   Years of education: Not on file   Highest education level: Not on file  Occupational History   Occupation: disabled veteran, [employer]    Employer: RETIRED  Tobacco Use   Smoking status: Former    Packs/day: 2.50    Years: 40.00    Additional pack years: 0.00    Total pack years: 100.00    Types: Cigarettes, Pipe, Cigars    Quit date: 07/28/1998    Years since quitting: 24.4    Passive exposure: Never   Smokeless tobacco: Never  Vaping Use   Vaping Use: Never used  Substance and Sexual Activity   Alcohol use: No    Alcohol/week: 0.0 standard drinks of alcohol   Drug use: No   Sexual activity: Not Currently  Other Topics Concern   Not on file  Social History Narrative   Lives in the home with his wife   Sees [place] every 6 month for meds.   Denies asbestos exposure   Social Determinants of  Health   Financial Resource Strain: Low Risk  (01/06/2023)   Overall Financial Resource Strain (CARDIA)    Difficulty of Paying Living Expenses: Not hard at all  Food Insecurity: No Food Insecurity (01/06/2023)   Hunger Vital Sign    Worried About Running Out of Food in the Last Year: Never true    Ran Out of Food in the Last Year: Never true  Transportation Needs: No Transportation Needs (01/06/2023)   PRAPARE - Administrator, Civil Service (Medical): No    Lack of Transportation (Non-Medical): No  Physical Activity: Inactive (01/06/2023)   Exercise Vital Sign    Days of Exercise per Week: 0 days    Minutes of Exercise per Session: 0 min  Stress: No Stress Concern Present (01/06/2023)   [employer] of Occupational Health - Occupational Stress Questionnaire    Feeling of Stress : Not at all  Social Connections: Socially Isolated (01/06/2023)   Social Connection and Isolation Panel [NHANES]    Frequency of Communication with Friends and Family: More than three times a week    Frequency of Social Gatherings with Friends and Family: More than three times a week    Attends Religious Services: Never    Database administrator or Organizations: No    Attends Banker Meetings: Never    Marital Status: Widowed    Tobacco Counseling Counseling given: Not Answered   Clinical Intake:  Pre-visit preparation completed: No  Pain : No/denies pain Pain Score: 0-No painNutrition Risk Assessment:  Has the patient had any N/V/D within the last 2 months?  No  Does the patient have any non-healing wounds?  No  Has the patient had any unintentional weight loss or weight gain?  No   Diabetes:  Is the patient diabetic?  Yes  If diabetic, was a CBG obtained today?  Yes CBG 113 Taken by patient Did the patient bring in their glucometer from home?  No  How  often do you monitor your CBG's? 4x daily.   Financial Strains and Diabetes Management:  Are you having any  financial strains with the device, your supplies or your medication? No .  Does the patient want to be seen by Chronic Care Management for management of their diabetes?  No  Would the patient like to be referred to a Nutritionist or for Diabetic Management?  No   Diabetic Exams:  Diabetic Eye Exam: Completed  . Overdue for diabetic eye exam. Pt has been advised about the importance in completing this exam. A referral has been placed today. Message sent to referral coordinator for scheduling purposes. Advised pt to expect a call from office referred to regarding appt.  Diabetic Foot Exam: Completed  . Pt has been advised about the importance in completing this exam. Pt is scheduled for diabetic foot exam on Followed by [facility].      BMI - recorded: 24.14 Nutritional Status: BMI of 19-24  Normal Nutritional Risks: None Diabetes: Yes CBG done?: Yes (CBG 113 Taken by patient) CBG resulted in Enter/ Edit results?: Yes Did pt. bring in CBG monitor from home?: No  How often do you need to have someone help you when you read instructions, pamphlets, or other written materials from your doctor or pharmacy?: 1 - Never  Diabetic? Yes  Interpreter Needed?: No  Information entered by :: Theresa Mulligan LPN   Activities of Daily Living    01/06/2023    9:12 AM 01/17/2022   10:47 AM  In your present state of health, do you have any difficulty performing the following activities:  Hearing? 0 0  Vision? 0 0  Difficulty concentrating or making decisions? 0 0  Walking or climbing stairs? 1 1  Comment Uses a cane   Dressing or bathing? 0 0  Doing errands, shopping? 0 0  Preparing Food and eating ? N N  Using the Toilet? N N  In the past six months, have you accidently leaked urine? N N  Do you have problems with loss of bowel control? N N  Managing your Medications? N N  Managing your Finances? N N  Housekeeping or managing your Housekeeping? N N    Patient Care Team: Myrlene Broker, MD as PCP - General (Internal Medicine) Jens Som Madolyn Frieze, MD as PCP - Cardiology (Cardiology) Ernesto Rutherford, MD as Consulting Physician (Ophthalmology) Kalman Shan, MD as Consulting Physician (Pulmonary Disease) System, Provider Not In as Consulting Physician Casimer Lanius, MD as Consulting Physician (Rheumatology) Oretha Milch, MD as Consulting Physician (Pulmonary Disease) Lewayne Bunting, MD as Consulting Physician (Cardiology)  Indicate any recent Medical Services you may have received from other than [facility] providers in the past year (date may be approximate).     Assessment:   This is a routine wellness examination for Ygnacio.  Hearing/Vision screen Hearing Screening - Comments:: Denies hearing difficulties   Vision Screening - Comments:: Wears rx glasses - up to date with routine eye exams with  [facility]  Dietary issues and exercise activities discussed: Current Exercise Habits: The patient does not participate in regular exercise at present, Exercise limited by: None identified   Goals Addressed               This Visit's Progress     No current goals (pt-stated)         Depression Screen    01/06/2023    9:11 AM 01/17/2022   10:45 AM 12/13/2021    9:00  AM 10/16/2021    9:45 AM 11/05/2020    9:15 AM 01/25/2019    9:48 AM 08/02/2018    9:51 AM  PHQ 2/9 Scores  PHQ - 2 Score 0 0 0 0 0 0 6  PHQ- 9 Score      1 12    Fall Risk    01/06/2023    9:13 AM 03/05/2022    4:05 PM 01/17/2022   10:37 AM 12/13/2021    9:00 AM 10/16/2021    9:45 AM  Fall Risk   Falls in the past year? 1 0 0 0   Comment  continues to deny recent falls x 12 months; uses cane as needed  denies new/ recent falls x 12 months; continues using cane as needed/ indicated Denies new/ recent falls; continues using cane as needed  Number falls in past yr: 0 0 0 0   Injury with Fall? 1 0 0 0   Comment Fx ribs on left side. Followed by medical attention N/A- no falls reported   N/A- denies falls   Risk for fall due to : No Fall Risks Medication side effect;Impaired mobility Medication side effect;Impaired mobility;History of fall(s) Medication side effect;Impaired mobility;History of fall(s) History of fall(s);Medication side effect;Impaired balance/gait  Follow up Falls prevention discussed Falls prevention discussed Falls prevention discussed Falls prevention discussed Falls prevention discussed    FALL RISK PREVENTION PERTAINING TO THE HOME:  Any stairs in or around the home? No  If so, are there any without handrails? No  Home free of loose throw rugs in walkways, pet beds, electrical cords, etc? Yes  Adequate lighting in your home to reduce risk of falls? Yes   ASSISTIVE DEVICES UTILIZED TO PREVENT FALLS:  Life alert? Yes  Use of a cane, walker or w/c? Yes  Grab bars in the bathroom? Yes  Shower chair or bench in shower? Yes  Elevated toilet seat or a handicapped toilet? Yes   TIMED UP AND GO:  Was the test performed? No . Audio Visit   Cognitive Function:        01/06/2023    9:14 AM 01/17/2022   10:48 AM  6CIT Screen  What Year? 0 points 0 points  What month? 0 points 0 points  What time? 0 points 0 points  Count back from 20 0 points 0 points  Months in reverse 0 points 0 points  Repeat phrase 0 points 0 points  Total Score 0 points 0 points    Immunizations Immunization History  Administered Date(s) Administered   COVID-19, mRNA, vaccine(Comirnaty)12 years and older 05/03/2022   Fluad Quad(high Dose 65+) 04/26/2019, 06/08/2020, 05/01/2021   Influenza Split 04/14/2011, 04/27/2013, 03/28/2014   Influenza Whole 05/28/2009, 04/27/2010, 03/31/2012   Influenza, High Dose Seasonal PF 04/10/2010, 04/11/2011, 04/19/2013, 03/21/2015, 04/05/2016, 05/16/2017, 03/28/2018, 04/08/2018   Influenza,inj,Quad PF,6+ Mos 03/17/2014, 04/04/2016   Influenza-Unspecified 04/30/2006, 04/28/2007, 04/28/2008, 05/29/2009, 04/10/2010, 05/26/2011, 05/07/2012,  05/28/2020, 05/01/2021, 04/27/2022   PFIZER Comirnaty(Gray Top)Covid-19 Tri-Sucrose Vaccine 11/29/2020, 05/01/2021   PFIZER(Purple Top)SARS-COV-2 Vaccination 08/19/2019, 09/09/2019, 03/15/2020, 04/08/2020, 12/09/2020   PNEUMOCOCCAL CONJUGATE-20 06/27/2022   Pfizer Covid-19 Vaccine Bivalent Booster 60yrs & up 05/01/2021   Pneumococcal Conjugate-13 09/20/2013, 03/29/2015   Pneumococcal Polysaccharide-23 05/28/2008, 04/25/2010, 04/19/2013   Pneumococcal-Unspecified 06/26/2006, 04/27/2013   Rsv, Bivalent, Protein Subunit Rsvpref,pf Verdis Frederickson) 07/30/2022   Td 11/23/2009   Tdap 07/17/2011, 07/30/2022   Zoster, Live 04/28/2009, 11/08/2009    TDAP status: Up to date  Flu Vaccine status: Up to date  Pneumococcal vaccine status: Up  to date  Covid-19 vaccine status: Completed vaccines  Qualifies for Shingles Vaccine? Yes   Zostavax completed No   Shingrix Completed?: No.    Education has been provided regarding the importance of this vaccine. Patient has been advised to call insurance company to determine out of pocket expense if they have not yet received this vaccine. Advised may also receive vaccine at local pharmacy or Health Dept. Verbalized acceptance and understanding.  Screening Tests Health Maintenance  Topic Date Due   Zoster Vaccines- Shingrix (1 of 2) Never done   OPHTHALMOLOGY EXAM  10/06/2018   HEMOGLOBIN A1C  02/18/2021   Diabetic kidney evaluation - Urine ACR  01/07/2023 (Originally 10/02/2017)   COVID-19 Vaccine (9 - 2023-24 season) 01/22/2023 (Originally 06/28/2022)   INFLUENZA VACCINE  02/26/2023   FOOT EXAM  08/16/2023   Diabetic kidney evaluation - eGFR measurement  01/02/2024   Medicare Annual Wellness (AWV)  01/06/2024   DTaP/Tdap/Td (4 - Td or Tdap) 07/30/2032   Pneumonia Vaccine 4+ Years old  Completed   HPV VACCINES  Aged Out   Hepatitis C Screening  Discontinued    Health Maintenance  Health Maintenance Due  Topic Date Due   Zoster Vaccines- Shingrix (1 of  2) Never done   OPHTHALMOLOGY EXAM  10/06/2018   HEMOGLOBIN A1C  02/18/2021    Colorectal cancer screening: No longer required.   Lung Cancer Screening: (Low Dose CT Chest recommended if Age 62-80 years, 30 pack-year currently smoking OR have quit w/in 15years.) does not qualify.    Additional Screening:  Hepatitis C Screening: does not qualify; Completed   Vision Screening: Recommended annual ophthalmology exams for early detection of glaucoma and other disorders of the eye. Is the patient up to date with their annual eye exam?  Yes  Who is the provider or what is the name of the office in which the patient attends annual eye exams? [facility] If pt is not established with a provider, would they like to be referred to a provider to establish care? No .   Dental Screening: Recommended annual dental exams for proper oral hygiene  Community Resource Referral / Chronic Care Management:  CRR required this visit?  No   CCM required this visit?  No      Plan:     I have personally reviewed and noted the following in the patient's chart:   Medical and social history Use of alcohol, tobacco or illicit drugs  Current medications and supplements including opioid prescriptions. Patient is not currently taking opioid prescriptions. Functional ability and status Nutritional status Physical activity Advanced directives List of other physicians Hospitalizations, surgeries, and ER visits in previous 12 months Vitals Screenings to include cognitive, depression, and falls Referrals and appointments  In addition, I have reviewed and discussed with patient certain preventive protocols, quality metrics, and best practice recommendations. A written personalized care plan for preventive services as well as general preventive health recommendations were provided to patient.     Tillie Rung, LPN   [id number]/[id number]   Nurse Notes:   Patient due Diabetic kidney evaluation-Urine ACR and  Hemoglobin A1C

## 2023-01-06 NOTE — Patient Instructions (Addendum)
Mr. [name] , Thank you for taking time to come for your Medicare Wellness Visit. I appreciate your ongoing commitment to your health goals. Please review the following plan we discussed and let me know if I can assist you in the future.   These are the goals we discussed:  Goals       No current goals (pt-stated)      To maintain and stay healthy.        This is a list of the screening recommended for you and due dates:  Health Maintenance  Topic Date Due   Zoster (Shingles) Vaccine (1 of 2) Never done   Eye exam for diabetics  10/06/2018   Hemoglobin A1C  02/18/2021   Yearly kidney health urinalysis for diabetes  01/07/2023*   COVID-19 Vaccine (9 - 2023-24 season) 01/22/2023*   Flu Shot  02/26/2023   Complete foot exam   08/16/2023   Yearly kidney function blood test for diabetes  01/02/2024   Medicare Annual Wellness Visit  01/06/2024   DTaP/Tdap/Td vaccine (4 - Td or Tdap) 07/30/2032   Pneumonia Vaccine  Completed   HPV Vaccine  Aged Out   Hepatitis C Screening  Discontinued  *Topic was postponed. The date shown is not the original due date.    Advanced directives: Please bring a copy of your health care power of attorney and living will to the office to be added to your chart at your convenience.   Conditions/risks identified: None  Next appointment: Follow up in one year for your annual wellness visit.    Preventive Care 15 Years and Older, Male  Preventive care refers to lifestyle choices and visits with your health care provider that can promote health and wellness. What does preventive care include? A yearly physical exam. This is also called an annual well check. Dental exams once or twice a year. Routine eye exams. Ask your health care provider how often you should have your eyes checked. Personal lifestyle choices, including: Daily care of your teeth and gums. Regular physical activity. Eating a healthy diet. Avoiding tobacco and drug use. Limiting  alcohol use. Practicing safe sex. Taking low doses of aspirin every day. Taking vitamin and mineral supplements as recommended by your health care provider. What happens during an annual well check? The services and screenings done by your health care provider during your annual well check will depend on your age, overall health, lifestyle risk factors, and family history of disease. Counseling  Your health care provider may ask you questions about your: Alcohol use. Tobacco use. Drug use. Emotional well-being. Home and relationship well-being. Sexual activity. Eating habits. History of falls. Memory and ability to understand (cognition). Work and work Astronomer. Screening  You may have the following tests or measurements: Height, weight, and BMI. Blood pressure. Lipid and cholesterol levels. These may be checked every 5 years, or more frequently if you are over 75 years old. Skin check. Lung cancer screening. You may have this screening every year starting at age 55 if you have a 30-pack-year history of smoking and currently smoke or have quit within the past 15 years. Fecal occult blood test (FOBT) of the stool. You may have this test every year starting at age 87. Flexible sigmoidoscopy or colonoscopy. You may have a sigmoidoscopy every 5 years or a colonoscopy every 10 years starting at age 78. Prostate cancer screening. Recommendations will vary depending on your family history and other risks. Hepatitis C blood test. Hepatitis B blood test.  Sexually transmitted disease (STD) testing. Diabetes screening. This is done by checking your blood sugar (glucose) after you have not eaten for a while (fasting). You may have this done every 1-3 years. Abdominal aortic aneurysm (AAA) screening. You may need this if you are a current or former smoker. Osteoporosis. You may be screened starting at age 64 if you are at high risk. Talk with your health care provider about your test results,  treatment options, and if necessary, the need for more tests. Vaccines  Your health care provider may recommend certain vaccines, such as: Influenza vaccine. This is recommended every year. Tetanus, diphtheria, and acellular pertussis (Tdap, Td) vaccine. You may need a Td booster every 10 years. Zoster vaccine. You may need this after age 56. Pneumococcal 13-valent conjugate (PCV13) vaccine. One dose is recommended after age 28. Pneumococcal polysaccharide (PPSV23) vaccine. One dose is recommended after age 31. Talk to your health care provider about which screenings and vaccines you need and how often you need them. This information is not intended to replace advice given to you by your health care provider. Make sure you discuss any questions you have with your health care provider. Document Released: 08/10/2015 Document Revised: 04/02/2016 Document Reviewed: 05/15/2015 Elsevier Interactive Patient Education  2017 Comfort Prevention in the Home Falls can cause injuries. They can happen to people of all ages. There are many things you can do to make your home safe and to help prevent falls. What can I do on the outside of my home? Regularly fix the edges of walkways and driveways and fix any cracks. Remove anything that might make you trip as you walk through a door, such as a raised step or threshold. Trim any bushes or trees on the path to your home. Use bright outdoor lighting. Clear any walking paths of anything that might make someone trip, such as rocks or tools. Regularly check to see if handrails are loose or broken. Make sure that both sides of any steps have handrails. Any raised decks and porches should have guardrails on the edges. Have any leaves, snow, or ice cleared regularly. Use sand or salt on walking paths during winter. Clean up any spills in your garage right away. This includes oil or grease spills. What can I do in the bathroom? Use night  lights. Install grab bars by the toilet and in the tub and shower. Do not use towel bars as grab bars. Use non-skid mats or decals in the tub or shower. If you need to sit down in the shower, use a plastic, non-slip stool. Keep the floor dry. Clean up any water that spills on the floor as soon as it happens. Remove soap buildup in the tub or shower regularly. Attach bath mats securely with double-sided non-slip rug tape. Do not have throw rugs and other things on the floor that can make you trip. What can I do in the bedroom? Use night lights. Make sure that you have a light by your bed that is easy to reach. Do not use any sheets or blankets that are too big for your bed. They should not hang down onto the floor. Have a firm chair that has side arms. You can use this for support while you get dressed. Do not have throw rugs and other things on the floor that can make you trip. What can I do in the kitchen? Clean up any spills right away. Avoid walking on wet floors. Keep items that you  use a lot in easy-to-reach places. If you need to reach something above you, use a strong step stool that has a grab bar. Keep electrical cords out of the way. Do not use floor polish or wax that makes floors slippery. If you must use wax, use non-skid floor wax. Do not have throw rugs and other things on the floor that can make you trip. What can I do with my stairs? Do not leave any items on the stairs. Make sure that there are handrails on both sides of the stairs and use them. Fix handrails that are broken or loose. Make sure that handrails are as long as the stairways. Check any carpeting to make sure that it is firmly attached to the stairs. Fix any carpet that is loose or worn. Avoid having throw rugs at the top or bottom of the stairs. If you do have throw rugs, attach them to the floor with carpet tape. Make sure that you have a light switch at the top of the stairs and the bottom of the stairs. If  you do not have them, ask someone to add them for you. What else can I do to help prevent falls? Wear shoes that: Do not have high heels. Have rubber bottoms. Are comfortable and fit you well. Are closed at the toe. Do not wear sandals. If you use a stepladder: Make sure that it is fully opened. Do not climb a closed stepladder. Make sure that both sides of the stepladder are locked into place. Ask someone to hold it for you, if possible. Clearly mark and make sure that you can see: Any grab bars or handrails. First and last steps. Where the edge of each step is. Use tools that help you move around (mobility aids) if they are needed. These include: Canes. Walkers. Scooters. Crutches. Turn on the lights when you go into a dark area. Replace any light bulbs as soon as they burn out. Set up your furniture so you have a clear path. Avoid moving your furniture around. If any of your floors are uneven, fix them. If there are any pets around you, be aware of where they are. Review your medicines with your doctor. Some medicines can make you feel dizzy. This can increase your chance of falling. Ask your doctor what other things that you can do to help prevent falls. This information is not intended to replace advice given to you by your health care provider. Make sure you discuss any questions you have with your health care provider. Document Released: 05/10/2009 Document Revised: 12/20/2015 Document Reviewed: 08/18/2014 Elsevier Interactive Patient Education  2017 [employer].

## 2023-01-07 ENCOUNTER — Encounter: Payer: Self-pay | Admitting: *Deleted

## 2023-01-13 NOTE — Progress Notes (Signed)
Remote pacemaker transmission.   

## 2023-01-18 DIAGNOSIS — G4733 Obstructive sleep apnea (adult) (pediatric): Secondary | ICD-10-CM | POA: Diagnosis not present

## 2023-02-09 ENCOUNTER — Other Ambulatory Visit: Payer: Self-pay | Admitting: Hematology and Oncology

## 2023-02-09 ENCOUNTER — Encounter: Payer: Self-pay | Admitting: Cardiology

## 2023-02-09 ENCOUNTER — Ambulatory Visit: Payer: Medicare PPO | Attending: Cardiology | Admitting: Cardiology

## 2023-02-09 VITALS — BP 140/80 | HR 60 | Ht 74.0 in | Wt 175.0 lb

## 2023-02-09 DIAGNOSIS — I5022 Chronic systolic (congestive) heart failure: Secondary | ICD-10-CM

## 2023-02-09 DIAGNOSIS — D6869 Other thrombophilia: Secondary | ICD-10-CM | POA: Diagnosis not present

## 2023-02-09 DIAGNOSIS — I480 Paroxysmal atrial fibrillation: Secondary | ICD-10-CM | POA: Diagnosis not present

## 2023-02-09 DIAGNOSIS — I2510 Atherosclerotic heart disease of native coronary artery without angina pectoris: Secondary | ICD-10-CM | POA: Diagnosis not present

## 2023-02-09 DIAGNOSIS — I10 Essential (primary) hypertension: Secondary | ICD-10-CM | POA: Diagnosis not present

## 2023-02-09 DIAGNOSIS — I442 Atrioventricular block, complete: Secondary | ICD-10-CM

## 2023-02-09 LAB — CUP PACEART INCLINIC DEVICE CHECK
Brady Statistic RA Percent Paced: 34 %
Brady Statistic RV Percent Paced: 42.7 %
Date Time Interrogation Session: 20240715160356
Implantable Lead Connection Status: 753985
Implantable Lead Connection Status: 753985
Implantable Lead Implant Date: 20201204
Implantable Lead Implant Date: 20201204
Implantable Lead Location: 753859
Implantable Lead Location: 753860
Implantable Lead Model: 5076
Implantable Lead Model: 5076
Implantable Pulse Generator Implant Date: 20201204
Lead Channel Impedance Value: 399 Ohm
Lead Channel Impedance Value: 418 Ohm
Lead Channel Pacing Threshold Amplitude: 0.5 V
Lead Channel Pacing Threshold Amplitude: 0.75 V
Lead Channel Pacing Threshold Pulse Width: 0.4 ms
Lead Channel Pacing Threshold Pulse Width: 0.4 ms
Lead Channel Sensing Intrinsic Amplitude: 2.8 mV
Lead Channel Sensing Intrinsic Amplitude: 7.8 mV

## 2023-02-09 NOTE — Progress Notes (Signed)
Electrophysiology Office Note:   Date:  02/09/2023  ID:  [name], DOB 1941/07/25, MRN [id number]  Primary Cardiologist: Olga Millers, MD Electrophysiologist: Hamish Banks Jorja Loa, MD      History of Present Illness:   [name] is a 82 y.o. male with h/o intermittent heart block seen today for routine electrophysiology followup.  Since last being seen in our clinic the patient reports doing well.  He is on oxygen, and is able to do his daily activities.  He tries to be as active as he can, walking around both [employer] and [employer].  His pacemaker is functioning appropriately.  He has no further episodes of syncope or near syncope since the pacemaker was implanted..  he denies chest pain, palpitations, dyspnea, PND, orthopnea, nausea, vomiting, dizziness, syncope, edema, weight gain, or early satiety.     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 Carotid dopplers 2/19 showed no significant stenosis. Admitted with sepsis 2/19 and also with atrial fibrillation, converted spontaneously to sinus; TSH low and synthroid adjusted. Placed on amiodarone. Amiodarone discontinued at previous office visit. Echocardiogram August 2020 showed normal LV function. Had pacemaker placed 12/20 due to history of syncope and pauses noted on monitor. 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 Apixaban on hold. Peripheral vascular disease followed by vascular surgery.  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      Review of systems complete and found to be negative unless listed in HPI.      EP Information / Studies Reviewed:    EKG is not ordered today. EKG from 12/26/22 reviewed which showed AV paced      PPM Interrogation-  reviewed in detail  today,  See PACEART report.  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 The patient's score is based upon: CHF History: 0 HTN History: 1 Diabetes History: 1 Stroke History: 0 Vascular Disease History: 1 Age Score: 2 Gender Score: 0      Physical Exam:   VS:  BP (!) 140/80   Pulse 60   Ht 6\' 2"  (1.88 m)   Wt 175 lb (79.4 kg)   SpO2 (!) 88%   BMI 22.47 kg/m    Wt Readings from Last 3 Encounters:  02/09/23 175 lb (79.4 kg)  01/06/23 188 lb (85.3 kg)  12/25/22 188 lb 12.8 oz (85.6 kg)     GEN: Well nourished, well developed in no acute distress NECK: No JVD; No carotid bruits CARDIAC: Regular rate and rhythm, no murmurs, rubs, gallops RESPIRATORY:  Clear to auscultation without rales, wheezing or rhonchi  ABDOMEN: Soft, non-tender, non-distended EXTREMITIES:  No edema; No deformity   ASSESSMENT AND PLAN:    CHB s/p Medtronic PPM  Normal PPM function See Pace Art report No changes today  2.  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  Coronary artery disease: No current chest pain.  Plan per primary cardiology  4.  Paroxysmal atrial fibrillation: Remains in sinus rhythm.  Anticoagulation discontinued due to hemoptysis, thrombocytopenia, history of ITP  5.  Hypertension: Mildly elevated today.  Usually well-controlled.  Plan per primary cardiology.  6.  Chronic combined systolic and diastolic heart failure: Patient is euvolemic.  Ejection fraction is improved.  7.  Secondary hypercoagulable state: Currently off anticoagulation for atrial fibrillation as above Disposition:   Follow up with Dr. Elberta Fortis in 12 months  Signed, Krishon Adkison Jorja Loa, MD

## 2023-02-19 ENCOUNTER — Ambulatory Visit: Payer: Medicare PPO | Admitting: Hematology and Oncology

## 2023-02-19 ENCOUNTER — Other Ambulatory Visit: Payer: Medicare PPO

## 2023-02-26 ENCOUNTER — Encounter: Payer: Self-pay | Admitting: Adult Health

## 2023-02-26 ENCOUNTER — Ambulatory Visit: Payer: Medicare PPO | Admitting: Adult Health

## 2023-02-26 VITALS — BP 140/80 | HR 65 | Temp 97.4°F | Ht 74.0 in | Wt 192.2 lb

## 2023-02-26 DIAGNOSIS — I5022 Chronic systolic (congestive) heart failure: Secondary | ICD-10-CM | POA: Diagnosis not present

## 2023-02-26 DIAGNOSIS — J432 Centrilobular emphysema: Secondary | ICD-10-CM | POA: Diagnosis not present

## 2023-02-26 DIAGNOSIS — J9611 Chronic respiratory failure with hypoxia: Secondary | ICD-10-CM | POA: Diagnosis not present

## 2023-02-26 DIAGNOSIS — R918 Other nonspecific abnormal finding of lung field: Secondary | ICD-10-CM | POA: Diagnosis not present

## 2023-02-26 NOTE — Progress Notes (Signed)
@Patient  ID: [name], male    DOB: 06-02-1941, 82 y.o.   MRN: [id number]  Chief Complaint  Patient presents with   Follow-up    Referring provider: Myrlene Broker, *  HPI: 82 yo male former smoker followed for very severe COPD, chronic hypoxic respiratory failure on Oxygen, OSA on CPAP . History of lung nodules followed on serial CT chest.  Medical history significant for rheumatoid arthritis not on maintenance medication, diabetes, congestive heart failure, coronary artery disease, chronic kidney disease, atrial fibrillation with previous use of amiodarone-stopped in August 2019.  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 PFT 10/2015 FEV1 was 36%, ratio 46, FVC 58%, DLCO 26%    CT chest 12/2016 -masslike consolidation in LUL   PET scan 03/20/17 >+metabolic act in LUL consolidation    CT chest 08/2017 >> 2.4 x 3.5 cm triangular subpleural opacity in the anterior left upper lobe > has resolved the platelike scarring bilateral nodular infiltrates stable,   CT chest 09/2020 severe emphysema, extensive pleural calcification.  Previous nodule in the right lower lobe has significantly decreased in size measuring  0.8 x 0.8 cm previously at 2.3 cm.  A stable left upper lobe cavitary nodule measuring 4 mm.  And a stable irregular nodule in the left lower lobe measuring 1.5 cm, and a left lower lobe nodule measuring 1.5 cm   CT chest October 22, 2021 that showed a stable uncomplicated aneurysmal dilatation along the aortic arch.  Interval increase of the abdominal aorta from 33 mm to 36 mm.,  Advanced emphysema.  Stable chronic extensive pleural calcifications, no worrisome pulmonary nodules.  Ill-defined left lower lobe pulmonary nodules unchanged since October 2020. Extensive cholelithiasis  CT chest 09/2022 Grossly stable 4.2 cm focal aneurysm seen involving aortic arch. Stable calcified pleural plaques and associated scarring are noted in right lung.Minimally displaced fractures are seen involving the left fourth, fifth, sixth and seventh ribs.  02/26/2023 Follow up : COPD, sleep apnea, chronic respiratory failure, lung nodule Patient presents for a 36-month follow-up.  Patient has underlying severe COPD that is oxygen dependent.  Overall says he has been doing okay.  Gets short of breath with minimum activities. Has daily with cough with thin mucus.  Patient remains independent.  Lives at home alone.  Is able to drive.  Does light house chores.  Patient's daughter lives close by and helps intermittently.  Patient is able to do his own shopping. Uses a cane to get around. Balance remains an issues. Has Life alert.  He remains on Stiolto daily.  Remains on oxygen 2 L at rest and 3 L with activity. Patient is on CPAP at bedtime for sleep apnea.  Says he wears a  CPAP every single night.  CPAP download shows excellent compliance with 100% usage.  Daily average usage at 8 hours.  Patient is on auto CPAP 10 to 15 cm H2O.  AHI 1.8/hour.  Feels he benefits from CPAP usage with decreased daytime sleepiness. Uses a full face mask.  CT chest March 2024 showed stable pleural plaques and associated scarring. RSV and Prevnar 20  are up to date.  Appetite is good with no n/v.d.   No Known Allergies  Immunization History  Administered Date(s) Administered   COVID-19, mRNA, vaccine(Comirnaty)12 years and older 05/03/2022   Fluad Quad(high Dose 65+) 04/26/2019, 06/08/2020, 05/01/2021   Influenza Split 04/14/2011, 04/27/2013, 03/28/2014   Influenza Whole 05/28/2009, 04/27/2010, 03/31/2012   Influenza, High Dose Seasonal PF 04/10/2010, 04/11/2011, 04/19/2013, 03/21/2015, 04/05/2016, 05/16/2017, 03/28/2018, 04/08/2018   Influenza,inj,Quad PF,6+ Mos 03/17/2014, 04/04/2016   Influenza-Unspecified 04/30/2006, 04/28/2007, 04/28/2008, 05/29/2009, 04/10/2010, 05/26/2011, 05/07/2012, 05/28/2020, 05/01/2021, 04/27/2022   PFIZER Comirnaty(Gray Top)Covid-19 Tri-Sucrose Vaccine 11/29/2020, 05/01/2021   PFIZER(Purple Top)SARS-COV-2 Vaccination 08/19/2019, 09/09/2019, 03/15/2020, 04/08/2020, 12/09/2020   PNEUMOCOCCAL CONJUGATE-20 06/27/2022   Pfizer Covid-19 Vaccine Bivalent Booster 80yrs & up 05/01/2021   Pneumococcal Conjugate-13 09/20/2013, 03/29/2015   Pneumococcal Polysaccharide-23 05/28/2008, 04/25/2010, 04/19/2013   Pneumococcal-Unspecified 06/26/2006, 04/27/2013   Rsv, Bivalent, Protein Subunit Rsvpref,pf Verdis Frederickson) 07/30/2022   Td 11/23/2009   Tdap 07/17/2011, 07/30/2022   Zoster, Live 04/28/2009, 11/08/2009    Past Medical History:  Diagnosis Date   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   ablated   Anxiety    Aortic aneurysm (HCC) 09/02/2017   BENIGN PROSTATIC HYPERTROPHY 11/23/2009   Cardiomyopathy (HCC) 08/28/2016   Chronic systolic CHF (congestive heart failure) (HCC) 09/02/2017   COPD (chronic obstructive pulmonary disease) (HCC)    CORONARY ARTERY DISEASE 11/23/2009   DECREASED HEARING, 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   DEGENERATIVE JOINT DISEASE 11/23/2009   DEPRESSION 11/23/2009   FATIGUE 11/23/2009   GAIT DISTURBANCE 12/10/2009   HEMOPTYSIS UNSPECIFIED 05/07/2010   High cholesterol    HYPERTENSION 07/30/2009   HYPOTHYROIDISM  07/30/2009   Ischemic cardiomyopathy 09/02/2017   LUMBAR RADICULOPATHY, 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   On home oxygen therapy    "2-3L; 24/7" (09/10/2016)   OSA on CPAP    Pneumonia    PTSD (post-traumatic stress disorder) 03/10/2012   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   RA (rheumatoid arthritis) (HCC) 06/11/2011   "qwhere" (09/10/2016)   RESPIRATORY FAILURE, CHRONIC 07/31/2009   Scleritis of both eyes 03/17/2014   Thrombocytopenia (HCC)    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   Type II diabetes mellitus (HCC)     Tobacco History: Social History   Tobacco Use  Smoking Status Former   Current packs/day: 0.00   Average packs/day: 2.5 packs/day for 40.0 years (100.0 ttl pk-yrs)   Types: Cigarettes, Pipe, Cigars   Start date: 07/28/1958   Quit date: 07/28/1998   Years since quitting: 24.6   Passive exposure: Never  Smokeless Tobacco Never   Counseling given: Not Answered   Outpatient Medications Prior to Visit  Medication Sig Dispense Refill   acetaminophen (TYLENOL) 500 MG tablet Take 1,000 mg by mouth every 6 (six) hours as needed for mild pain.     albuterol (PROVENTIL) (2.5 MG/3ML) 0.083% nebulizer solution Take 3 mLs (2.5 mg total) by nebulization every 6 (six) hours as needed for wheezing or shortness of breath. 120 mL 12   albuterol (VENTOLIN HFA) 108 (90 Base) MCG/ACT inhaler Inhale 2 puffs into the lungs every 6 (six) hours as needed for wheezing or shortness of breath. 18 g 3   atorvastatin (LIPITOR)  40 MG tablet Take 1 tablet (40 mg total) by mouth daily. 90 tablet 3   Carboxymethylcellul-Glycerin (LUBRICATING EYE DROPS OP) Place 1 drop into both eyes 4 (four) times daily as needed (dry eyes).      Cholecalciferol (VITAMIN D3) 2000 units capsule Take 2,000 Units by mouth daily.      clopidogrel (PLAVIX) 75 MG tablet TAKE 1 TABLET(75 MG) BY MOUTH DAILY 30 tablet 11   Continuous Blood Gluc Receiver (DEXCOM G7 RECEIVER) DEVI Use to check blood sugars 3x a day. DX CODE: E11.69, E78.5 1 each 0   Continuous Blood Gluc  Sensor (DEXCOM G7 SENSOR) MISC Use to check blood sugars 3x a day. DX CODE: E11.69, E78.5 3 each 4   Continuous Blood Gluc Transmit (DEXCOM G6 TRANSMITTER) MISC by Does not apply route.     cyanocobalamin (VITAMIN B12) 500 MCG tablet TAKE ONE TABLET BY MOUTH DAILY FOR LOW B12 LEVEL     empagliflozin (JARDIANCE) 10 MG TABS tablet Take 5 mg by mouth daily. Filled at [place].     ferrous sulfate 324 (65 Fe) MG TBEC Take 1 tablet by mouth daily.     fluticasone (FLONASE) 50 MCG/ACT nasal spray Place 1 spray into both nostrils daily. (Patient taking differently: Place 1 spray into both nostrils daily as needed for allergies.) 18.2 mL 2   imiquimod (ALDARA) 5 % cream APPLY THIN LAYER TO AFFECTED AREA THREE TIMES A WEEK APPLY TO WARTS 3-5 TIMES PER WEEK FOR 6 WEEKS OR UNTIL WARTS DISAPPEAR     insulin glargine (LANTUS) 100 UNIT/ML injection Inject 30 Units into the skin at bedtime.     leflunomide (ARAVA) 10 MG tablet TAKE ONE TABLET BY MOUTH DAILY FOR RHEUMATOID ARTHRITIS     levothyroxine (SYNTHROID) 125 MCG tablet TAKE ONE TABLET BY MOUTH DAILY FOR THYROID     losartan (COZAAR) 100 MG tablet Take 1 tablet (100 mg total) by mouth daily. 90 tablet 3   metFORMIN (GLUCOPHAGE) 500 MG tablet Take 500-1,000 mg by mouth 2 (two) times daily with a meal. Take 1 tablet (500 mg) by mouth in the morning & take 2 tablets (1000 mg) by mouth in the evening.     Multiple Vitamins-Minerals (MEGA MULTIVITAMIN FOR MEN PO) Take 1 tablet by mouth daily.     OXYGEN Inhale 2 L into the lungs continuous.     predniSONE (DELTASONE) 5 MG tablet Take 1 tablet (5 mg total) by mouth daily with breakfast. 90 tablet 3   pregabalin (LYRICA) 75 MG capsule Take 1 capsule (75 mg total) by mouth 2 (two) times daily.     Semaglutide (OZEMPIC, 1 MG/DOSE, [place]) Inject 1 mg into the skin every Friday.     sertraline (ZOLOFT) 100 MG tablet TAKE ONE-HALF TABLET BY MOUTH EVERY MORNING AFTER BREAKFAST FOR MENTAL HEALTH     tamsulosin (FLOMAX) 0.4 MG  CAPS capsule Take 1 capsule (0.4 mg total) by mouth at bedtime. 30 capsule 0   Tiotropium Bromide-Olodaterol (STIOLTO RESPIMAT) 2.5-2.5 MCG/ACT AERS Inhale 2 puffs into the lungs daily. 4 g 0   traZODone (DESYREL) 50 MG tablet TAKE ONE TABLET BY MOUTH AT BEDTIME FOR INSOMNIA     No facility-administered medications prior to visit.     Review of Systems:   Constitutional:   No  weight loss, night sweats,  Fevers, chills, + fatigue, or  lassitude.  HEENT:   No headaches,  Difficulty swallowing,  Tooth/dental problems, or  Sore throat,  No sneezing, itching, ear ache, nasal congestion, post nasal drip,   CV:  No chest pain,  Orthopnea, PND, swelling in lower extremities, anasarca, dizziness, palpitations, syncope.   GI  No heartburn, indigestion, abdominal pain, nausea, vomiting, diarrhea, change in bowel habits, loss of appetite, bloody stools.   Resp: .  No chest wall deformity  Skin: no rash or lesions.  GU: no dysuria, change in color of urine, no urgency or frequency.  No flank pain, no hematuria   MS:  No joint pain or swelling.  No decreased range of motion.  No back pain.    Physical Exam  BP (!) 140/80 (BP Location: Left Arm, Patient Position: Sitting, Cuff Size: Normal)   Pulse 65   Temp (!) 97.4 F (36.3 C) (Temporal)   Ht 6\' 2"  (1.88 m)   Wt 192 lb 3.2 oz (87.2 kg)   SpO2 93%   BMI 24.68 kg/m   GEN: A/Ox3; pleasant , NAD, well nourished , elderly , on O2 , cane    HEENT:  [place]/AT,  NOSE-clear, THROAT-clear, no lesions, no postnasal drip or exudate noted.   NECK:  Supple w/ fair ROM; no JVD; normal carotid impulses w/o bruits; no thyromegaly or nodules palpated; no lymphadenopathy.    RESP  Decreased BS in bases. no accessory muscle use, no dullness to percussion  CARD:  RRR, no m/r/g, tr  peripheral edema, pulses intact, no cyanosis or clubbing.  GI:   Soft & nt; nml bowel sounds; no organomegaly or masses detected.   Musco: Warm bil, no  deformities or joint swelling noted.   Neuro: alert, no focal deficits noted.    Skin: Warm, no lesions or rashes    Lab Results:  CBC     BNP No results found for: "BNP"    Imaging:   Administration History     None          Latest Ref Rng & Units 11/16/2015    9:55 AM 12/30/2013    9:45 AM  PFT Results  FVC-Pre L 2.96  3.01   FVC-Predicted Pre % 59  59   FVC-Post L 2.89  3.01   FVC-Predicted Post % 58  59   Pre FEV1/FVC % % 43  45   Post FEV1/FCV % % 46  49   FEV1-Pre L 1.28  1.36   FEV1-Predicted Pre % 35  36   FEV1-Post L 1.33  1.48   DLCO uncorrected ml/min/mmHg 9.88  10.13   DLCO UNC% % 26  26   DLCO corrected ml/min/mmHg 9.94    DLCO COR %Predicted % 26    DLVA Predicted % 40  39   TLC L  6.31   TLC % Predicted %  80   RV % Predicted %  80     No results found for: "NITRICOXIDE"      Assessment & Plan:   COPD (chronic obstructive pulmonary disease) (HCC) Severe COPD currently at baseline.  Continue on current maintenance regimen with Stiolto.  Immunizations are up-to-date.  Plan  Patient Instructions  Continue on Stiolto 2 puffs daily .  Albuterol inhaler /nebulizer As needed   Mucinex DM Twice daily  As needed  Cough/congestion .  Continue on Oxygen 2l/m rest and 3l/m with activity .  Allegra daily As needed   Flonase daily As needed   Continue on CPAP At bedtime wear with Naps.  Activity as tolerated.  Saline nasal rinses Twice daily   Saline nasal gel  At bedtime .  Follow up in 4 Months  with Dr. Vassie Loll or Eyoel Throgmorton NP and As needed   Please contact office for sooner follow up if symptoms do not improve or worsen or seek emergency care     Chronic respiratory failure with hypoxia (HCC) Continue on oxygen to maintain O2 saturations greater than 88 to 90%  Chronic systolic CHF (congestive heart failure) (HCC) Appears euvolemic on exam.  Continue follow-up with cardiology  Lung nodules CT chest March 2024 showed stable  scarring.     Rubye Oaks, NP 02/26/2023

## 2023-02-26 NOTE — Assessment & Plan Note (Signed)
CT chest March 2024 showed stable scarring.

## 2023-02-26 NOTE — Assessment & Plan Note (Signed)
Continue on oxygen to maintain O2 saturations greater than 88 to 90%. 

## 2023-02-26 NOTE — Assessment & Plan Note (Signed)
Severe COPD currently at baseline.  Continue on current maintenance regimen with Stiolto.  Immunizations are up-to-date.  Plan  Patient Instructions  Continue on Stiolto 2 puffs daily .  Albuterol inhaler /nebulizer As needed   Mucinex DM Twice daily  As needed  Cough/congestion .  Continue on Oxygen 2l/m rest and 3l/m with activity .  Allegra daily As needed   Flonase daily As needed   Continue on CPAP At bedtime wear with Naps.  Activity as tolerated.  Saline nasal rinses Twice daily   Saline nasal gel At bedtime .  Follow up in 4 Months  with Dr. Vassie Loll or Abbegail Matuska NP and As needed   Please contact office for sooner follow up if symptoms do not improve or worsen or seek emergency care

## 2023-02-26 NOTE — Assessment & Plan Note (Signed)
Appears euvolemic on exam.  Continue follow-up with cardiology 

## 2023-02-26 NOTE — Patient Instructions (Signed)
Continue on Stiolto 2 puffs daily .  Albuterol inhaler /nebulizer As needed   Mucinex DM Twice daily  As needed  Cough/congestion .  Continue on Oxygen 2l/m rest and 3l/m with activity .  Allegra daily As needed   Flonase daily As needed   Continue on CPAP At bedtime wear with Naps.  Activity as tolerated.  Saline nasal rinses Twice daily   Saline nasal gel At bedtime .  Follow up in 4 Months  with Dr. Elsworth Soho or [place] NP and As needed   Please contact office for sooner follow up if symptoms do not improve or worsen or seek emergency care

## 2023-03-26 ENCOUNTER — Inpatient Hospital Stay: Payer: Medicare PPO | Admitting: Hematology and Oncology

## 2023-03-26 ENCOUNTER — Inpatient Hospital Stay: Payer: Medicare PPO

## 2023-03-27 ENCOUNTER — Ambulatory Visit (INDEPENDENT_AMBULATORY_CARE_PROVIDER_SITE_OTHER): Payer: Medicare PPO

## 2023-03-27 DIAGNOSIS — I442 Atrioventricular block, complete: Secondary | ICD-10-CM

## 2023-03-28 LAB — CUP PACEART REMOTE DEVICE CHECK
Battery Remaining Longevity: 111 mo
Battery Voltage: 3 V
Brady Statistic AP VP Percent: 63.86 %
Brady Statistic AP VS Percent: 1.04 %
Brady Statistic AS VP Percent: 14.95 %
Brady Statistic AS VS Percent: 20.15 %
Brady Statistic RA Percent Paced: 67.19 %
Brady Statistic RV Percent Paced: 78.81 %
Date Time Interrogation Session: 20240830063635
Implantable Lead Connection Status: 753985
Implantable Lead Connection Status: 753985
Implantable Lead Implant Date: 20201204
Implantable Lead Implant Date: 20201204
Implantable Lead Location: 753859
Implantable Lead Location: 753860
Implantable Lead Model: 5076
Implantable Lead Model: 5076
Implantable Pulse Generator Implant Date: 20201204
Lead Channel Impedance Value: 323 Ohm
Lead Channel Impedance Value: 342 Ohm
Lead Channel Impedance Value: 399 Ohm
Lead Channel Impedance Value: 399 Ohm
Lead Channel Pacing Threshold Amplitude: 0.5 V
Lead Channel Pacing Threshold Amplitude: 0.625 V
Lead Channel Pacing Threshold Pulse Width: 0.4 ms
Lead Channel Pacing Threshold Pulse Width: 0.4 ms
Lead Channel Sensing Intrinsic Amplitude: 2.375 mV
Lead Channel Sensing Intrinsic Amplitude: 2.375 mV
Lead Channel Sensing Intrinsic Amplitude: 7.125 mV
Lead Channel Sensing Intrinsic Amplitude: 7.125 mV
Lead Channel Setting Pacing Amplitude: 1.5 V
Lead Channel Setting Pacing Amplitude: 2.5 V
Lead Channel Setting Pacing Pulse Width: 0.4 ms
Lead Channel Setting Sensing Sensitivity: 2 mV
Zone Setting Status: 755011
Zone Setting Status: 755011

## 2023-03-31 NOTE — Progress Notes (Signed)
Remote pacemaker transmission.   

## 2023-04-01 ENCOUNTER — Telehealth: Payer: Self-pay

## 2023-04-01 NOTE — Telephone Encounter (Signed)
Caller: Patient  Concern: blister on LLE approx quarter-sized, swelling, worsening constant pain, warm to touch  Pt stated that he could barely stand it and couldn't even wait until next week when Dr. Chestine Spore is in the office or he'd have to go to ED.  Location: left leg  Description:  blister since last week, constant pain worsening over past few months  Aggravating Factors: movement, walking, standing, lying down, touching  Quality: uncomfortable  Treatments: acetaminophen, ibuprofen (OTC), and elevation, unable to wear compressions  Resolution: Appointment scheduled for first available to accommodate daughter being able to bring him to have appts all in one day  Next Appt: Appointment scheduled for 9/6 @ 1330 for ABI and 1430 for PA

## 2023-04-03 ENCOUNTER — Ambulatory Visit: Payer: Medicare PPO | Admitting: Physician Assistant

## 2023-04-03 ENCOUNTER — Ambulatory Visit (HOSPITAL_COMMUNITY)
Admission: RE | Admit: 2023-04-03 | Discharge: 2023-04-03 | Disposition: A | Discharge status: Home / Self Care | Payer: Medicare PPO | Source: Ambulatory Visit | Attending: Vascular Surgery

## 2023-04-03 VITALS — BP 183/88 | HR 59 | Temp 97.5°F | Wt 195.0 lb

## 2023-04-03 DIAGNOSIS — I739 Peripheral vascular disease, unspecified: Secondary | ICD-10-CM

## 2023-04-03 DIAGNOSIS — L97229 Non-pressure chronic ulcer of left calf with unspecified severity: Secondary | ICD-10-CM | POA: Diagnosis not present

## 2023-04-03 DIAGNOSIS — I83022 Varicose veins of left lower extremity with ulcer of calf: Secondary | ICD-10-CM

## 2023-04-03 DIAGNOSIS — I724 Aneurysm of artery of lower extremity: Secondary | ICD-10-CM | POA: Diagnosis not present

## 2023-04-03 LAB — VAS US ABI WITH/WO TBI
Left ABI: 0.57
Right ABI: 1.01

## 2023-04-03 NOTE — Progress Notes (Signed)
Office Note     CC:  follow up Requesting Provider:  Myrlene Broker, *  HPI: [name] is a 82 y.o. (Dec 01, 1940) male who presents for evaluation of combined arterial and venous insufficiency.  Past medical history significant for COPD on home oxygen as well as CHF.  He previously underwent left common femoral to PT bypass with right great saphenous vein on 08/22/2020 due to thrombosed left popliteal artery aneurysm with rest pain by Dr. Chestine Spore.  This bypass is known to be occluded however he has been asymptomatic.  Last week he developed a blister over his distal calf and the left lower extremity.  This is now a venous ulceration.  He has not been wearing his compression.  He has had venous ulcerations on and off in the past.  He denies any rest pain in his left foot.   Past Medical History:  Diagnosis Date   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   ablated   Anxiety    Aortic aneurysm (HCC) 09/02/2017   BENIGN PROSTATIC HYPERTROPHY 11/23/2009   Cardiomyopathy (HCC) 08/28/2016   Chronic systolic CHF (congestive heart failure) (HCC) 09/02/2017   COPD (chronic obstructive pulmonary disease) (HCC)    CORONARY ARTERY DISEASE 11/23/2009   DECREASED HEARING, 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   DEGENERATIVE JOINT DISEASE 11/23/2009   DEPRESSION 11/23/2009   FATIGUE 11/23/2009   GAIT DISTURBANCE 12/10/2009   HEMOPTYSIS UNSPECIFIED 05/07/2010   High cholesterol    HYPERTENSION 07/30/2009   HYPOTHYROIDISM 07/30/2009   Ischemic cardiomyopathy 09/02/2017   LUMBAR RADICULOPATHY, 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   On home oxygen therapy    "2-3L; 24/7" (09/10/2016)   OSA on CPAP    Pneumonia    PTSD (post-traumatic stress disorder) 03/10/2012   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   RA (rheumatoid arthritis) (HCC) 06/11/2011   "qwhere" (09/10/2016)   RESPIRATORY FAILURE, CHRONIC 07/31/2009   Scleritis of both eyes 03/17/2014   Thrombocytopenia (HCC)    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   Type II diabetes mellitus (HCC)     Past Surgical History:   Procedure Laterality Date   ABDOMINAL AORTIC ANEURYSM REPAIR  07/2002   [name] 12/10/2010   ABDOMINAL AORTOGRAM W/LOWER EXTREMITY N/A 08/16/2020   Procedure: ABDOMINAL AORTOGRAM W/LOWER EXTREMITY;  Surgeon: Cephus Shelling, MD;  Location: [facility];  Service: Cardiovascular;  Laterality: N/A;   ABDOMINAL AORTOGRAM W/LOWER EXTREMITY N/A 10/24/2021   Procedure: ABDOMINAL AORTOGRAM W/LOWER EXTREMITY;  Surgeon: Cephus Shelling, MD;  Location: [facility];  Service: Cardiovascular;  Laterality: N/A;   ABDOMINAL AORTOGRAM W/LOWER EXTREMITY N/A 03/13/2022   Procedure: ABDOMINAL AORTOGRAM W/LOWER EXTREMITY;  Surgeon: Cephus Shelling, MD;  Location: [facility];  Service: Cardiovascular;  Laterality: N/A;   ABDOMINAL EXPLORATION SURGERY  02/2004   w/LOA/notes 12/10/2010; small bowel obstruction repair with adhesiolysis    BACK SURGERY     CARDIAC CATHETERIZATION     2 heart caths in the past.  One in 2000s showed one ulcerated plaque  Rx medically; Second at [facility] /notes 09/05/2016   CATARACT EXTRACTION W/ INTRAOCULAR LENS  IMPLANT, BILATERAL Bilateral 2000s   COLECTOMY     hx of remote ileum resection due to bleeding   COLONOSCOPY WITH PROPOFOL N/A 01/22/2018   Procedure: COLONOSCOPY WITH PROPOFOL;  Surgeon: Iva Boop, MD;  Location: [facility];  Service: Endoscopy;  Laterality: N/A;   CORONARY ANGIOPLASTY WITH STENT PLACEMENT  09/10/2016   CORONARY STENT INTERVENTION N/A 09/10/2016   Procedure: Coronary Stent Intervention;  Surgeon: Kathleene Hazel,  MD;  Location: [facility];  Service: Cardiovascular;  Laterality: N/A;  Distal RCA 4.0x16 Synergy   ESOPHAGOGASTRODUODENOSCOPY (EGD) WITH PROPOFOL N/A 01/22/2018   Procedure: ESOPHAGOGASTRODUODENOSCOPY (EGD) WITH PROPOFOL;  Surgeon: Iva Boop, MD;  Location: [facility];  Service: Endoscopy;  Laterality: N/A;   EYE SURGERY     FALSE ANEURYSM REPAIR Left 08/22/2020   Procedure: EXCLUSION OF LEFT  POPLITEAL ARTERY ANEURYSM;  Surgeon: Cephus Shelling, MD;  Location: [facility] OR;  Service: Vascular;  Laterality: Left;   FEMORAL EMBOLOECTOMY Left 07/2000   with left leg ischemia; Dr. Hart Rochester, vascular   FEMORAL-POPLITEAL BYPASS GRAFT Bilateral 08/22/2020   Procedure: LEFT FEMORAL-POSTERIOR TIBIAL ARTERY BYPASS GRAFT USING RIGHT GREATER NONREVERSED SAPHENOUS VEIN GRAFT;  Surgeon: Cephus Shelling, MD;  Location: [facility] OR;  Service: Vascular;  Laterality: Bilateral;   GANGLION CYST EXCISION Right    "wrist"; Dr. Teressa Senter   HOT HEMOSTASIS N/A 01/22/2018   Procedure: HOT HEMOSTASIS (ARGON PLASMA COAGULATION/BICAP);  Surgeon: Iva Boop, MD;  Location: [facility];  Service: Endoscopy;  Laterality: N/A;   LOOP RECORDER INSERTION N/A 04/25/2019   Procedure: LOOP RECORDER INSERTION;  Surgeon: Duke Salvia, MD;  [facility];  Service: Cardiovascular;  Laterality: N/A;   LOOP RECORDER REMOVAL N/A 07/01/2019   Procedure: LOOP RECORDER REMOVAL;  Surgeon: Duke Salvia, MD;  Location: [facility] CV LAB;  Service: Cardiovascular;  Laterality: N/A;   LUMBAR LAMINECTOMY  1972   Dr. Fannie Knee   PACEMAKER IMPLANT N/A 07/01/2019   Procedure: PACEMAKER IMPLANT;  Surgeon: Duke Salvia, MD;  Location[facility] INVAS[facility] CV LAB;  Service: Cardiovascular;  Laterality: N/A;   PERIPHERAL VASCULAR BALLOON ANGIOPLASTY Left 10/24/2021   Procedure: PERIPHERAL VASCULAR BALLOON ANGIOPLASTY;  Surgeon: Cephus Shelling, MD;  Location: [facility];  Service: Cardiovascular;  Laterality: Left;   RIGHT/LEFT HEART CATH AND CORONARY ANGIOGRAPHY N/A 09/10/2016   Procedure: Right/Left Heart Cath and Coronary Angiography;  Surgeon: Kathleene Hazel, MD;  Location: [facility] INVASIVE CV LAB;  Service: Cardiovascular;  Laterality: N/A;   TONSILLECTOMY      Social History   Socioeconomic History   Marital status: Widowed    Spouse name: Not on file   Number of children: Not on file   Years of education: Not  on file   Highest education level: Not on file  Occupational History   Occupation: disabled veteran, [employer]    Employer: RETIRED  Tobacco Use   Smoking status: Former    Current packs/day: 0.00    Average packs/day: 2.5 packs/day for 40.0 years (100.0 ttl pk-yrs)    Types: Cigarettes, Pipe, Cigars    Start date: 07/28/1958    Quit date: 07/28/1998    Years since quitting: 24.6    Passive exposure: Never   Smokeless tobacco: Never  Vaping Use   Vaping status: Never Used  Substance and Sexual Activity   Alcohol use: No    Alcohol/week: 0.0 standard drinks of alcohol   Drug use: No   Sexual activity: Not Currently  Other Topics Concern   Not on file  Social History Narrative   Lives in the home with his wife   Sees [place] every 6 month for meds.   Denies asbestos exposure   Social Determinants of Health   Financial Resource Strain: Low Risk  (01/06/2023)   Overall Financial Resource Strain (CARDIA)    Difficulty of Paying Living Expenses: Not hard at all  Food Insecurity: No  Food Insecurity (01/06/2023)   Hunger Vital Sign    Worried About Running Out of Food in the Last Year: Never true    Ran Out of Food in the Last Year: Never true  Transportation Needs: No Transportation Needs (01/06/2023)   PRAPARE - Administrator, Civil Service (Medical): No    Lack of Transportation (Non-Medical): No  Physical Activity: Inactive (01/06/2023)   Exercise Vital Sign    Days of Exercise per Week: 0 days    Minutes of Exercise per Session: 0 min  Stress: No Stress Concern Present (01/06/2023)   [employer] of Occupational Health - Occupational Stress Questionnaire    Feeling of Stress : Not at all  Social Connections: Socially Isolated (01/06/2023)   Social Connection and Isolation Panel [NHANES]    Frequency of Communication with Friends and Family: More than three times a week    Frequency of Social Gatherings with Friends and Family: More than three times a week     Attends Religious Services: Never    Database administrator or Organizations: No    Attends Banker Meetings: Never    Marital Status: Widowed  Intimate Partner Violence: Not At Risk (01/06/2023)   Humiliation, Afraid, Rape, and Kick questionnaire    Fear of Current or Ex-Partner: No    Emotionally Abused: No    Physically Abused: No    Sexually Abused: No    Family History  Adopted: Yes  Problem Relation Age of Onset   Other Mother        gun shot    Current Outpatient Medications  Medication Sig Dispense Refill   acetaminophen (TYLENOL) 500 MG tablet Take 1,000 mg by mouth every 6 (six) hours as needed for mild pain.     albuterol (PROVENTIL) (2.5 MG/3ML) 0.083% nebulizer solution Take 3 mLs (2.5 mg total) by nebulization every 6 (six) hours as needed for wheezing or shortness of breath. 120 mL 12   albuterol (VENTOLIN HFA) 108 (90 Base) MCG/ACT inhaler Inhale 2 puffs into the lungs every 6 (six) hours as needed for wheezing or shortness of breath. 18 g 3   atorvastatin (LIPITOR) 40 MG tablet Take 1 tablet (40 mg total) by mouth daily. 90 tablet 3   Carboxymethylcellul-Glycerin (LUBRICATING EYE DROPS OP) Place 1 drop into both eyes 4 (four) times daily as needed (dry eyes).      Cholecalciferol (VITAMIN D3) 2000 units capsule Take 2,000 Units by mouth daily.      clopidogrel (PLAVIX) 75 MG tablet TAKE 1 TABLET(75 MG) BY MOUTH DAILY 30 tablet 11   Continuous Blood Gluc Receiver (DEXCOM G7 RECEIVER) DEVI Use to check blood sugars 3x a day. DX CODE: E11.69, E78.5 1 each 0   Continuous Blood Gluc Sensor (DEXCOM G7 SENSOR) MISC Use to check blood sugars 3x a day. DX CODE: E11.69, E78.5 3 each 4   Continuous Blood Gluc Transmit (DEXCOM G6 TRANSMITTER) MISC by Does not apply route.     cyanocobalamin (VITAMIN B12) 500 MCG tablet TAKE ONE TABLET BY MOUTH DAILY FOR LOW B12 LEVEL     empagliflozin (JARDIANCE) 10 MG TABS tablet Take 5 mg by mouth daily. Filled at [place].     ferrous  sulfate 324 (65 Fe) MG TBEC Take 1 tablet by mouth daily.     fluticasone (FLONASE) 50 MCG/ACT nasal spray Place 1 spray into both nostrils daily. (Patient taking differently: Place 1 spray into both nostrils daily as needed for allergies.) 18.2  mL 2   imiquimod (ALDARA) 5 % cream APPLY THIN LAYER TO AFFECTED AREA THREE TIMES A WEEK APPLY TO WARTS 3-5 TIMES PER WEEK FOR 6 WEEKS OR UNTIL WARTS DISAPPEAR     insulin glargine (LANTUS) 100 UNIT/ML injection Inject 30 Units into the skin at bedtime.     leflunomide (ARAVA) 10 MG tablet TAKE ONE TABLET BY MOUTH DAILY FOR RHEUMATOID ARTHRITIS     levothyroxine (SYNTHROID) 125 MCG tablet TAKE ONE TABLET BY MOUTH DAILY FOR THYROID     losartan (COZAAR) 100 MG tablet Take 1 tablet (100 mg total) by mouth daily. 90 tablet 3   metFORMIN (GLUCOPHAGE) 500 MG tablet Take 500-1,000 mg by mouth 2 (two) times daily with a meal. Take 1 tablet (500 mg) by mouth in the morning & take 2 tablets (1000 mg) by mouth in the evening.     Multiple Vitamins-Minerals (MEGA MULTIVITAMIN FOR MEN PO) Take 1 tablet by mouth daily.     OXYGEN Inhale 2 L into the lungs continuous.     predniSONE (DELTASONE) 5 MG tablet Take 1 tablet (5 mg total) by mouth daily with breakfast. 90 tablet 3   pregabalin (LYRICA) 75 MG capsule Take 1 capsule (75 mg total) by mouth 2 (two) times daily.     Semaglutide (OZEMPIC, 1 MG/DOSE, [place]) Inject 1 mg into the skin every Friday.     sertraline (ZOLOFT) 100 MG tablet TAKE ONE-HALF TABLET BY MOUTH EVERY MORNING AFTER BREAKFAST FOR MENTAL HEALTH     tamsulosin (FLOMAX) 0.4 MG CAPS capsule Take 1 capsule (0.4 mg total) by mouth at bedtime. 30 capsule 0   Tiotropium Bromide-Olodaterol (STIOLTO RESPIMAT) 2.5-2.5 MCG/ACT AERS Inhale 2 puffs into the lungs daily. 4 g 0   traZODone (DESYREL) 50 MG tablet TAKE ONE TABLET BY MOUTH AT BEDTIME FOR INSOMNIA     No current facility-administered medications for this visit.    No Known Allergies   REVIEW OF  SYSTEMS:   [X]  denotes positive finding, [ ]  denotes negative finding Cardiac  Comments:  Chest pain or chest pressure:    Shortness of breath upon exertion:    Short of breath when lying flat:    Irregular heart rhythm:        Vascular    Pain in calf, thigh, or hip brought on by ambulation:    Pain in feet at night that wakes you up from your sleep:     Blood clot in your veins:    Leg swelling:         Pulmonary    Oxygen at home:    Productive cough:     Wheezing:         Neurologic    Sudden weakness in arms or legs:     Sudden numbness in arms or legs:     Sudden onset of difficulty speaking or slurred speech:    Temporary loss of vision in one eye:     Problems with dizziness:         Gastrointestinal    Blood in stool:     Vomited blood:         Genitourinary    Burning when urinating:     Blood in urine:        Psychiatric    Major depression:         Hematologic    Bleeding problems:    Problems with blood clotting too easily:        Skin  Rashes or ulcers:        Constitutional    Fever or chills:      PHYSICAL EXAMINATION:  Vitals:   04/03/23 1332  BP: (!) 183/88  Pulse: (!) 59  Temp: (!) 97.5 F (36.4 C)  TempSrc: Temporal  SpO2: 90%  Weight: 195 lb (88.5 kg)    General:  WDWN in NAD; vital signs documented above Gait: Not observed HENT: WNL, normocephalic Pulmonary: normal non-labored breathing , without Rales, rhonchi,  wheezing Cardiac: regular HR Abdomen: soft, NT, no masses Skin: without rashes Vascular Exam/Pulses: Left DP, peroneal, PT signals by Doppler Extremities: Venous ulceration of the distal left calf pictured below Musculoskeletal: no muscle wasting or atrophy  Neurologic: A&O X 3 Psychiatric:  The pt has Normal affect.    Non-Invasive Vascular Imaging:   ABI/TBIToday's ABIToday's TBIPrevious ABIPrevious TBI  +-------+-----------+-----------+------------+------------+  Right 1.01       0.79        1.07        0.64          +-------+-----------+-----------+------------+------------+  Left  0.57       0.37       0.50        0.28          +-------+-----------+-----------+------------+------------+     ASSESSMENT/PLAN:: 82 y.o. male with left calf venous ulceration in the presence of known combined arterial and venous insufficiency  [name] is an 82 year old male with history of left femoral to PT bypass in January 2022 due to critical limb ischemia with rest pain due to thrombosed popliteal aneurysm.  This is known to be occluded.  Surgical history also significant for aorto biiliac bypass years ago.  Left TBI today was 73 mmHg and he has dopplerable signals in the peroneal, DP, and PT position.  He is not a good candidate for open surgery given his comorbidities.  We will apply a light Unna boot today.  He will follow-up on Tuesday for an Unna boot change.  Hopefully he has adequate flow to heal this venous ulceration with wound care alone.  We will also involve Dr. Chestine Spore during next office visit.   Emilie Rutter, PA-C [facility] [phone]  Clinic MD:   Karin Lieu

## 2023-04-07 ENCOUNTER — Ambulatory Visit: Payer: Medicare PPO

## 2023-04-07 VITALS — BP 182/97 | HR 68 | Temp 98.3°F | Resp 18 | Ht 74.0 in | Wt 198.0 lb

## 2023-04-07 DIAGNOSIS — I83022 Varicose veins of left lower extremity with ulcer of calf: Secondary | ICD-10-CM

## 2023-04-07 DIAGNOSIS — L97229 Non-pressure chronic ulcer of left calf with unspecified severity: Secondary | ICD-10-CM

## 2023-04-07 NOTE — Progress Notes (Signed)
HISTORY AND PHYSICAL     CC:  follow up. Requesting Provider:  Myrlene Broker, *  HPI: This is a 82 y.o. male who is here today for follow up.   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  Most recently on 10/24/2021 he underwent angioplasty of the distal left common femoral to PT vein bypass with a 4 mm angioplasty balloon given high grade stenosis.  He then developed a recurrent stenosis and we took him on 03/13/2022 for another left lower extremity intervention and his bypass was found to be occluded.  No intervention was performed given this was a transradial approach with a silent occlusion.   Of note he does have a history of an aortobiiliac bypass for AAA in 2004 by Dr. Hart Rochester.  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  He has been delaying intervention on the right popliteal aneurysm given this is not good anatomy for stent graft and he does not feel he can sustain another bypass.  He has remote hx of left transfemoral embolectomy of tibial vessels by Dr. Hart Rochester in 2002.  He has hx of exploratory laparotomy for SBO in 2005.  Pt was last seen 04/03/2023 and at that time, he had developed a venous ulceration of the LLE. He had not been wearing his compression.  He has hx of venous ulcerations in the past.  He was not having any rest pain in his foot.  His left TBI was 73 mm Hg and he had doppler signals in the peroneal, DP and PT.  He was placed in a light unna boot with plans for follow up today.  He is not a good candidate for open surgery for occluded bypass given his co-morbidities.  The pt returns today for follow up and here with his daughter.  He states that he has some pain in his left leg that starts behind his left ankle and goes up his leg at night.  He does not have  any wounds on his feet.    His blood pressure is high today.  He states that his cardiologist increased his dose and his pressure got higher.    The pt is on a statin for cholesterol management.    The pt is not on an aspirin.    Other AC:  Plavix The pt is on ARB for hypertension.  The pt is  on medication for diabetes. Tobacco hx:  former    Past Medical History:  Diagnosis Date   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   ablated   Anxiety    Aortic aneurysm (HCC) 09/02/2017   BENIGN PROSTATIC HYPERTROPHY 11/23/2009   Cardiomyopathy (HCC) 08/28/2016   Chronic systolic CHF (congestive heart failure) (HCC) 09/02/2017   COPD (chronic obstructive pulmonary disease) (HCC)    CORONARY ARTERY DISEASE 11/23/2009   DECREASED HEARING, 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   DEGENERATIVE JOINT DISEASE 11/23/2009   DEPRESSION 11/23/2009   FATIGUE 11/23/2009   GAIT DISTURBANCE 12/10/2009   HEMOPTYSIS UNSPECIFIED 05/07/2010   High cholesterol    HYPERTENSION 07/30/2009   HYPOTHYROIDISM 07/30/2009   Ischemic cardiomyopathy 09/02/2017   LUMBAR RADICULOPATHY, 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   On home oxygen therapy    "2-3L; 24/7" (09/10/2016)   OSA on CPAP    Pneumonia    PTSD (post-traumatic  stress disorder) 03/10/2012   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   RA (rheumatoid arthritis) (HCC) 06/11/2011   "qwhere" (09/10/2016)   RESPIRATORY FAILURE, CHRONIC 07/31/2009   Scleritis of both eyes 03/17/2014   Thrombocytopenia (HCC)    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   Type II diabetes mellitus [facility])     Past Surgical History:  Procedure Laterality Date   ABDOMINAL AORTIC ANEURYSM REPAIR  07/2002   [name] 12/10/2010   ABDOMINAL AORTOGRAM W/LOWER EXTREMITY N/A 08/16/2020   Procedure: ABDOMINAL AORTOGRAM W/LOWER EXTREMITY;  Surgeon: Cephus Shelling, MD;  Location: [facility];  Service: Cardiovascular;  Laterality: N/A;   ABDOMINAL AORTOGRAM W/LOWER EXTREMITY N/A 10/24/2021   Procedure: ABDOMINAL AORTOGRAM W/LOWER EXTREMITY;  Surgeon: Cephus Shelling, MD;   Location: [facility];  Service: Cardiovascular;  Laterality: N/A;   ABDOMINAL AORTOGRAM W/LOWER EXTREMITY N/A 03/13/2022   Procedure: ABDOMINAL AORTOGRAM W/LOWER EXTREMITY;  Surgeon: Cephus Shelling, MD;  Location: [facility];  Service: Cardiovascular;  Laterality: N/A;   ABDOMINAL EXPLORATION SURGERY  02/2004   w/LOA/notes 12/10/2010; small bowel obstruction repair with adhesiolysis    BACK SURGERY     CARDIAC CATHETERIZATION     2 heart caths in the past.  One in 2000s showed one ulcerated plaque  Rx medically; Second at [facility] /notes 09/05/2016   CATARACT EXTRACTION W/ INTRAOCULAR LENS  IMPLANT, BILATERAL Bilateral 2000s   COLECTOMY     hx of remote ileum resection due to bleeding   COLONOSCOPY WITH PROPOFOL N/A 01/22/2018   Procedure: COLONOSCOPY WITH PROPOFOL;  Surgeon: Iva Boop, MD;  Location: [facility] ENDOS[facility];  Service: Endoscopy;  Laterality: N/A;   CORONARY ANGIOPLASTY WITH STENT PLACEMENT  09/10/2016   CORONARY STENT INTERVENTION N/A 09/10/2016   Procedure: Coronary Stent Intervention;  Surgeon: Kathleene Hazel, MD;  Location: [facility];  Service: Cardiovascular;  Laterality: N/A;  Distal RCA 4.0x16 Synergy   ESOPHAGOGASTRODUODENOSCOPY (EGD) WITH PROPOFOL N/A 01/22/2018   Procedure: ESOPHAGOGASTRODUODENOSCOPY (EGD) WITH PROPOFOL;  Surgeon: Iva Boop, MD;  Location: [facility] ENDOS[facility];  Service: Endoscopy;  Laterality: N/A;   EYE SURGERY     FALSE ANEURYSM REPAIR Left 08/22/2020   Procedure: EXCLUSION OF LEFT POPLITEAL ARTERY ANEURYSM;  Surgeon: Cephus Shelling, MD;  Location: [facility] OR;  Service: Vascular;  Laterality: Left;   FEMORAL EMBOLOECTOMY Left 07/2000   with left leg ischemia; Dr. Hart Rochester, vascular   FEMORAL-POPLITEAL BYPASS GRAFT Bilateral 08/22/2020   Procedure: LEFT FEMORAL-POSTERIOR TIBIAL ARTERY BYPASS GRAFT USING RIGHT GREATER NONREVERSED SAPHENOUS VEIN GRAFT;  Surgeon: Cephus Shelling, MD;  Location: [facility] OR;  Service: Vascular;   Laterality: Bilateral;   GANGLION CYST EXCISION Right    "wrist"; Dr. Teressa Senter   HOT HEMOSTASIS N/A 01/22/2018   Procedure: HOT HEMOSTASIS (ARGON PLASMA COAGULATION/BICAP);  Surgeon: Iva Boop, MD;  Location: [facility];  Service: Endoscopy;  Laterality: N/A;   LOOP RECORDER INSERTION N/A 04/25/2019   Procedure: LOOP RECORDER INSERTION;  Surgeon: Duke Salvia, MD;  Location: [facility] INVASIVE CV LAB;  Service: Cardiovascular;  Laterality: N/A;   LOOP RECORDER REMOVAL N/A 07/01/2019   Procedure: LOOP RECORDER REMOVAL;  Surgeon: Duke Salvia, MD;  Location: [facility] INVASIVE CV LAB;  Service: Cardiovascular;  Laterality: N/A;   LUMBAR LAMINECTOMY  1972   Dr. Fannie Knee   PACEMAKER IMPLANT N/A 07/01/2019   Procedure: PACEMAKER IMPLANT;  Surgeon: Duke Salvia, MD;  Location[facility] INVASIVE CV LAB;  Service: Cardiovascular;  Laterality: N/A;   PERIPHERAL VASCULAR BALLOON ANGIOPLASTY Left  10/24/2021   Procedure: PERIPHERAL VASCULAR BALLOON ANGIOPLASTY;  Surgeon: Cephus Shelling, MD;  Location: [facility] INVASIVE CV LAB;  Service: Cardiovascular;  Laterality: Left;   RIGHT/LEFT HEART CATH AND CORONARY ANGIOGRAPHY N/A 09/10/2016   Procedure: Right/Left Heart Cath and Coronary Angiography;  Surgeon: Kathleene Hazel, MD;  Location: [facility] INVASIVE CV LAB;  Service: Cardiovascular;  Laterality: N/A;   TONSILLECTOMY      No Known Allergies  Current Outpatient Medications  Medication Sig Dispense Refill   acetaminophen (TYLENOL) 500 MG tablet Take 1,000 mg by mouth every 6 (six) hours as needed for mild pain.     albuterol (PROVENTIL) (2.5 MG/3ML) 0.083% nebulizer solution Take 3 mLs (2.5 mg total) by nebulization every 6 (six) hours as needed for wheezing or shortness of breath. 120 mL 12   albuterol (VENTOLIN HFA) 108 (90 Base) MCG/ACT inhaler Inhale 2 puffs into the lungs every 6 (six) hours as needed for wheezing or shortness of breath. 18 g 3   atorvastatin (LIPITOR) 40 MG tablet Take 1 tablet (40 mg total)  by mouth daily. 90 tablet 3   Carboxymethylcellul-Glycerin (LUBRICATING EYE DROPS OP) Place 1 drop into both eyes 4 (four) times daily as needed (dry eyes).      Cholecalciferol (VITAMIN D3) 2000 units capsule Take 2,000 Units by mouth daily.      clopidogrel (PLAVIX) 75 MG tablet TAKE 1 TABLET(75 MG) BY MOUTH DAILY 30 tablet 11   Continuous Blood Gluc Receiver (DEXCOM G7 RECEIVER) DEVI Use to check blood sugars 3x a day. DX CODE: E11.69, E78.5 1 each 0   Continuous Blood Gluc Sensor (DEXCOM G7 SENSOR) MISC Use to check blood sugars 3x a day. DX CODE: E11.69, E78.5 3 each 4   Continuous Blood Gluc Transmit (DEXCOM G6 TRANSMITTER) MISC by Does not apply route.     cyanocobalamin (VITAMIN B12) 500 MCG tablet TAKE ONE TABLET BY MOUTH DAILY FOR LOW B12 LEVEL     empagliflozin (JARDIANCE) 10 MG TABS tablet Take 5 mg by mouth daily. Filled at [place].     ferrous sulfate 324 (65 Fe) MG TBEC Take 1 tablet by mouth daily.     fluticasone (FLONASE) 50 MCG/ACT nasal spray Place 1 spray into both nostrils daily. (Patient taking differently: Place 1 spray into both nostrils daily as needed for allergies.) 18.2 mL 2   imiquimod (ALDARA) 5 % cream APPLY THIN LAYER TO AFFECTED AREA THREE TIMES A WEEK APPLY TO WARTS 3-5 TIMES PER WEEK FOR 6 WEEKS OR UNTIL WARTS DISAPPEAR     insulin glargine (LANTUS) 100 UNIT/ML injection Inject 30 Units into the skin at bedtime.     leflunomide (ARAVA) 10 MG tablet TAKE ONE TABLET BY MOUTH DAILY FOR RHEUMATOID ARTHRITIS     levothyroxine (SYNTHROID) 125 MCG tablet TAKE ONE TABLET BY MOUTH DAILY FOR THYROID     losartan (COZAAR) 100 MG tablet Take 1 tablet (100 mg total) by mouth daily. 90 tablet 3   metFORMIN (GLUCOPHAGE) 500 MG tablet Take 500-1,000 mg by mouth 2 (two) times daily with a meal. Take 1 tablet (500 mg) by mouth in the morning & take 2 tablets (1000 mg) by mouth in the evening.     Multiple Vitamins-Minerals (MEGA MULTIVITAMIN FOR MEN PO) Take 1 tablet by mouth daily.      OXYGEN Inhale 2 L into the lungs continuous.     predniSONE (DELTASONE) 5 MG tablet Take 1 tablet (5 mg total) by mouth daily with breakfast. 90 tablet 3  pregabalin (LYRICA) 75 MG capsule Take 1 capsule (75 mg total) by mouth 2 (two) times daily.     Semaglutide (OZEMPIC, 1 MG/DOSE, [place]) Inject 1 mg into the skin every Friday.     sertraline (ZOLOFT) 100 MG tablet TAKE ONE-HALF TABLET BY MOUTH EVERY MORNING AFTER BREAKFAST FOR MENTAL HEALTH     tamsulosin (FLOMAX) 0.4 MG CAPS capsule Take 1 capsule (0.4 mg total) by mouth at bedtime. 30 capsule 0   Tiotropium Bromide-Olodaterol (STIOLTO RESPIMAT) 2.5-2.5 MCG/ACT AERS Inhale 2 puffs into the lungs daily. 4 g 0   traZODone (DESYREL) 50 MG tablet TAKE ONE TABLET BY MOUTH AT BEDTIME FOR INSOMNIA     No current facility-administered medications for this visit.    Family History  Adopted: Yes  Problem Relation Age of Onset   Other Mother        gun shot    Social History   Socioeconomic History   Marital status: Widowed    Spouse name: Not on file   Number of children: Not on file   Years of education: Not on file   Highest education level: Not on file  Occupational History   Occupation: disabled veteran, [employer]    Employer: RETIRED  Tobacco Use   Smoking status: Former    Current packs/day: 0.00    Average packs/day: 2.5 packs/day for 40.0 years (100.0 ttl pk-yrs)    Types: Cigarettes, Pipe, Cigars    Start date: 07/28/1958    Quit date: 07/28/1998    Years since quitting: 24.7    Passive exposure: Never   Smokeless tobacco: Never  Vaping Use   Vaping status: Never Used  Substance and Sexual Activity   Alcohol use: No    Alcohol/week: 0.0 standard drinks of alcohol   Drug use: No   Sexual activity: Not Currently  Other Topics Concern   Not on file  Social History Narrative   Lives in the home with his wife   Sees [place] every 6 month for meds.   Denies asbestos exposure   Social Determinants of Health    Financial Resource Strain: Low Risk  (01/06/2023)   Overall Financial Resource Strain (CARDIA)    Difficulty of Paying Living Expenses: Not hard at all  Food Insecurity: No Food Insecurity (01/06/2023)   Hunger Vital Sign    Worried About Running Out of Food in the Last Year: Never true    Ran Out of Food in the Last Year: Never true  Transportation Needs: No Transportation Needs (01/06/2023)   PRAPARE - Administrator, Civil Service (Medical): No    Lack of Transportation (Non-Medical): No  Physical Activity: Inactive (01/06/2023)   Exercise Vital Sign    Days of Exercise per Week: 0 days    Minutes of Exercise per Session: 0 min  Stress: No Stress Concern Present (01/06/2023)   [employer] of Occupational Health - Occupational Stress Questionnaire    Feeling of Stress : Not at all  Social Connections: Socially Isolated (01/06/2023)   Social Connection and Isolation Panel [NHANES]    Frequency of Communication with Friends and Family: More than three times a week    Frequency of Social Gatherings with Friends and Family: More than three times a week    Attends Religious Services: Never    Database administrator or Organizations: No    Attends Banker Meetings: Never    Marital Status: Widowed  Intimate Partner Violence: Not At Risk (01/06/2023)  Humiliation, Afraid, Rape, and Kick questionnaire    Fear of Current or Ex-Partner: No    Emotionally Abused: No    Physically Abused: No    Sexually Abused: No      PHYSICAL EXAMINATION:  Today's Vitals   04/07/23 1517  BP: (!) 182/97  Pulse: 68  Resp: 18  Temp: 98.3 F (36.8 C)  TempSrc: Temporal  SpO2: 93%  Weight: 198 lb (89.8 kg)  Height: 6\' 2"  (1.88 m)   Body mass index is 25.42 kg/m.   General:  WDWN in NAD; vital signs documented above Gait: Not observed HENT: WNL, normocephalic Pulmonary: normal non-labored breathing on [place] O2 Cardiac: regular HR Extremities: without ischemic  changes, without Gangrene , without cellulitis; without open wounds; venous stasis changes left leg; venous stasis ulcer appears improved from last week. Easily palpable right popliteal pulse.  Musculoskeletal: no muscle wasting or atrophy  Neurologic: A&O X 3 Psychiatric:  The pt has Normal affect.     ASSESSMENT/PLAN:: 82 y.o. male here for follow up for PAD with extensive revascularization hx as above now with venous stasis ulcer left calf  Venous stasis ulcer -discussed pt with Dr. Chestine Spore.  He does have + doppler signals left PT/peroneal.  Venous stasis ulcer appears improved from last week.  Will reapply light compression unna boot and have him return for follow up next week.  Hypertension -discussed with pt to take his pressure in the morning and evening and write it down and follow up with his cardiologist.  Instructed him to take his BP machine with him to the his visit to compare for accuracy.    Right popliteal aneurysm -is not a candidate for popliteal artery stenting and does not want open surgery.   -discussed getting better BP control is important.   -does not want open surgery, but if this does rupture, discussions would need to be had with surgeon/pt/daughter.  They expressed understanding.    Doreatha Massed, [facility] [phone]  Clinic MD:   discussed pt with Dr. Chestine Spore.

## 2023-04-08 ENCOUNTER — Emergency Department (HOSPITAL_BASED_OUTPATIENT_CLINIC_OR_DEPARTMENT_OTHER)
Admission: EM | Admit: 2023-04-08 | Discharge: 2023-04-08 | Disposition: A | Discharge status: Home / Self Care | Payer: Medicare PPO | Attending: Emergency Medicine | Admitting: Emergency Medicine

## 2023-04-08 ENCOUNTER — Other Ambulatory Visit: Payer: Self-pay

## 2023-04-08 ENCOUNTER — Telehealth: Payer: Self-pay | Admitting: Cardiology

## 2023-04-08 ENCOUNTER — Encounter (HOSPITAL_BASED_OUTPATIENT_CLINIC_OR_DEPARTMENT_OTHER): Payer: Self-pay | Admitting: Emergency Medicine

## 2023-04-08 DIAGNOSIS — I509 Heart failure, unspecified: Secondary | ICD-10-CM | POA: Insufficient documentation

## 2023-04-08 DIAGNOSIS — I130 Hypertensive heart and chronic kidney disease with heart failure and stage 1 through stage 4 chronic kidney disease, or unspecified chronic kidney disease: Secondary | ICD-10-CM | POA: Diagnosis not present

## 2023-04-08 DIAGNOSIS — J449 Chronic obstructive pulmonary disease, unspecified: Secondary | ICD-10-CM | POA: Insufficient documentation

## 2023-04-08 DIAGNOSIS — Z7984 Long term (current) use of oral hypoglycemic drugs: Secondary | ICD-10-CM | POA: Insufficient documentation

## 2023-04-08 DIAGNOSIS — E119 Type 2 diabetes mellitus without complications: Secondary | ICD-10-CM | POA: Diagnosis not present

## 2023-04-08 DIAGNOSIS — Z7902 Long term (current) use of antithrombotics/antiplatelets: Secondary | ICD-10-CM | POA: Insufficient documentation

## 2023-04-08 DIAGNOSIS — N189 Chronic kidney disease, unspecified: Secondary | ICD-10-CM | POA: Insufficient documentation

## 2023-04-08 DIAGNOSIS — Z950 Presence of cardiac pacemaker: Secondary | ICD-10-CM | POA: Insufficient documentation

## 2023-04-08 DIAGNOSIS — I10 Essential (primary) hypertension: Secondary | ICD-10-CM | POA: Diagnosis not present

## 2023-04-08 NOTE — ED Notes (Signed)
Patient presents today on home 02. I placed him on a tank to save his battery on concentrator. 2L on a full tank

## 2023-04-08 NOTE — ED Provider Notes (Signed)
[place] [facility] AT [facility] Provider Note   CSN: [id number] Arrival date & time: 04/08/23  1243     History  Chief Complaint  Patient presents with   Hypertension    [name] is a 82 y.o. male.  Patient with history of hypertension, COPD on home oxygen, CHF/ischemic cardiomyopathy, aortic aneurysm, peripheral arterial disease, diabetes, status post pacemaker placement, anemia due to chronic kidney disease --presents to the emergency department today for elevated blood pressure reading.  Patient is on losartan 100 mg.  This was most recently increased in the spring by his cardiologist.  Patient went to vascular surgery yesterday where he is being treated for chronic left lower extremity wound.  His blood pressure was elevated at that time.  He was encouraged to follow-up with his cardiologist.  He called them today and reported some hazy vision in addition to recently elevated blood pressure readings.  He was encouraged to come to the emergency department for evaluation.  Patient denies chest pain, shortness of breath.  Lower extremity swelling is at baseline. Patient denies signs of stroke including: facial droop, slurred speech, aphasia, weakness/numbness in extremities, imbalance/trouble walking.  He denies loss of vision or painful vision.  No black spots or amaurosis fugax type symptoms.  He states that he gets routine eye exams through the [place] and his most recent exam is stable.  He drove himself to the emergency department today.       Home Medications Prior to Admission medications   Medication Sig Start Date End Date Taking? Authorizing Provider  acetaminophen (TYLENOL) 500 MG tablet Take 1,000 mg by mouth every 6 (six) hours as needed for mild pain.    [provider]  albuterol (PROVENTIL) (2.5 MG/3ML) 0.083% nebulizer solution Take 3 mLs (2.5 mg total) by nebulization every 6 (six) hours as needed for wheezing or shortness of  breath. 10/25/20   Parrett, Virgel Bouquet, NP  albuterol (VENTOLIN HFA) 108 (90 Base) MCG/ACT inhaler Inhale 2 puffs into the lungs every 6 (six) hours as needed for wheezing or shortness of breath. 06/27/22   Parrett, Virgel Bouquet, NP  atorvastatin (LIPITOR) 40 MG tablet Take 1 tablet (40 mg total) by mouth daily. 10/21/16   Pricilla Riffle, MD  Carboxymethylcellul-Glycerin (LUBRICATING EYE DROPS OP) Place 1 drop into both eyes 4 (four) times daily as needed (dry eyes).     [provider]  Cholecalciferol (VITAMIN D3) 2000 units capsule Take 2,000 Units by mouth daily.  01/11/16   [provider]  clopidogrel (PLAVIX) 75 MG tablet TAKE 1 TABLET(75 MG) BY MOUTH DAILY 08/28/22   Maeola Harman, MD  Continuous Blood Gluc Receiver (DEXCOM G7 RECEIVER) DEVI Use to check blood sugars 3x a day. DX CODE: E11.69, E78.5 08/20/22   Myrlene Broker, MD  Continuous Blood Gluc Sensor (DEXCOM G7 SENSOR) MISC Use to check blood sugars 3x a day. DX CODE: E11.69, E78.5 08/20/22   Myrlene Broker, MD  Continuous Blood Gluc Transmit (DEXCOM G6 TRANSMITTER) MISC by Does not apply route.    [provider]  cyanocobalamin (VITAMIN B12) 500 MCG tablet TAKE ONE TABLET BY MOUTH DAILY FOR LOW B12 LEVEL 07/30/22   [provider]  empagliflozin (JARDIANCE) 10 MG TABS tablet Take 5 mg by mouth daily. Filled at [place].    [provider]  ferrous sulfate 324 (65 Fe) MG TBEC Take 1 tablet by mouth daily. 10/02/21   [provider]  fluticasone Aleda Grana)  50 MCG/ACT nasal spray Place 1 spray into both nostrils daily. Patient taking differently: Place 1 spray into both nostrils daily as needed for allergies. 07/17/21   Cobb, Ruby Cola, NP  imiquimod (ALDARA) 5 % cream APPLY THIN LAYER TO AFFECTED AREA THREE TIMES A WEEK APPLY TO WARTS 3-5 TIMES PER WEEK FOR 6 WEEKS OR UNTIL WARTS DISAPPEAR 12/08/22   [provider]  insulin glargine (LANTUS) 100 UNIT/ML injection Inject 30  Units into the skin at bedtime.    [provider]  leflunomide (ARAVA) 10 MG tablet TAKE ONE TABLET BY MOUTH DAILY FOR RHEUMATOID ARTHRITIS 08/01/22   [provider]  levothyroxine (SYNTHROID) 125 MCG tablet TAKE ONE TABLET BY MOUTH DAILY FOR THYROID 12/07/22   [provider]  losartan (COZAAR) 100 MG tablet Take 1 tablet (100 mg total) by mouth daily. 12/24/22   Lewayne Bunting, MD  metFORMIN (GLUCOPHAGE) 500 MG tablet Take 500-1,000 mg by mouth 2 (two) times daily with a meal. Take 1 tablet (500 mg) by mouth in the morning & take 2 tablets (1000 mg) by mouth in the evening.    [provider]  Multiple Vitamins-Minerals (MEGA MULTIVITAMIN FOR MEN PO) Take 1 tablet by mouth daily.    [provider]  OXYGEN Inhale 2 L into the lungs continuous.    [provider]  predniSONE (DELTASONE) 5 MG tablet Take 1 tablet (5 mg total) by mouth daily with breakfast. 02/09/23   Serena Croissant, MD  pregabalin (LYRICA) 75 MG capsule Take 1 capsule (75 mg total) by mouth 2 (two) times daily. 03/27/22   Serena Croissant, MD  Semaglutide (OZEMPIC, 1 MG/DOSE, Esto) Inject 1 mg into the skin every Friday.    [provider]  sertraline (ZOLOFT) 100 MG tablet TAKE ONE-HALF TABLET BY MOUTH EVERY MORNING AFTER BREAKFAST FOR MENTAL HEALTH 11/06/22   [provider]  tamsulosin (FLOMAX) 0.4 MG CAPS capsule Take 1 capsule (0.4 mg total) by mouth at bedtime. 09/14/17   Sharlene Dory, DO  Tiotropium Bromide-Olodaterol (STIOLTO RESPIMAT) 2.5-2.5 MCG/ACT AERS Inhale 2 puffs into the lungs daily. 03/01/21   Parrett, Virgel Bouquet, NP  traZODone (DESYREL) 50 MG tablet TAKE ONE TABLET BY MOUTH AT BEDTIME FOR INSOMNIA 11/06/22   [provider]      Allergies    Patient has no known allergies.    Review of Systems   Review of Systems  Physical Exam Updated Vital Signs BP (!) 160/80 (BP Location: Left Arm)   Pulse (!) 56   Temp 97.9 F (36.6 C)  (Oral)   Resp (!) 22   SpO2 94%  Physical Exam Vitals and nursing note reviewed.  Constitutional:      General: He is not in acute distress.    Appearance: He is well-developed.  HENT:     Head: Normocephalic and atraumatic.     Right Ear: Tympanic membrane, ear canal and external ear normal.     Left Ear: Tympanic membrane, ear canal and external ear normal.     Nose: Nose normal.     Mouth/Throat:     Pharynx: Uvula midline.  Eyes:     General: Lids are normal.        Right eye: No discharge.        Left eye: No discharge.     Conjunctiva/sclera: Conjunctivae normal.     Pupils: Pupils are equal, round, and reactive to light.  Cardiovascular:     Rate and Rhythm: Normal  rate.  Pulmonary:     Effort: Pulmonary effort is normal.     Breath sounds: Normal breath sounds.  Abdominal:     Palpations: Abdomen is soft.     Tenderness: There is no abdominal tenderness.  Musculoskeletal:        General: Normal range of motion.     Cervical back: Normal range of motion and neck supple. No tenderness or bony tenderness.     Comments: Unna boot LLE.   Skin:    General: Skin is warm and dry.  Neurological:     Mental Status: He is alert and oriented to person, place, and time.     GCS: GCS eye subscore is 4. GCS verbal subscore is 5. GCS motor subscore is 6.     Cranial Nerves: No cranial nerve deficit.     Sensory: No sensory deficit.     Motor: No abnormal muscle tone.     Coordination: Coordination normal.     Gait: Gait normal.     ED Results / Procedures / Treatments   Labs (all labs ordered are listed, but only abnormal results are displayed) Labs Reviewed - No data to display  EKG None  Radiology No results found.  Procedures Procedures    Medications Ordered in ED Medications - No data to display  ED Course/ Medical Decision Making/ A&P    Patient seen and examined. History obtained directly from patient.  I reviewed recent vascular surgery and  cardiology notes.  Work-up including labs, imaging, EKG ordered in triage, if performed, were reviewed.   Labs/EKG: None ordered  Imaging: None ordered  Medications/Fluids: None ordered  Most recent vital signs reviewed and are as follows: BP (!) 160/80 (BP Location: Left Arm)   Pulse (!) 56   Temp 97.9 F (36.6 C) (Oral)   Resp (!) 22   SpO2 94%   Initial impression: Elevated blood pressure reading  Patient with reassuring exam today.  His ocular symptoms are vague and generalized.  No loss of vision.  No eye pain.  Not consistent with retinal artery occlusion, TIA.  Will discuss with Dr. Wilkie Aye.  I did discuss possibility of getting lab workup with patient, but he does not feel that this is really necessary given the fact that he has had stable labs recently.  I tend to agree.  He agrees to defer at this time.  Patient discussed with and seen by Dr. Wilkie Aye.  Agree that patient is cleared for discharge at this time.  No concern for acute CVA.  His blood pressure here is 160/80 and would not make any acute adjustments to his medications.  I encouraged him to follow-up with cardiology as planned.  He is in agreement and is comfortable with discharged home at this time.  Home treatment plan: Continue home medications as planned  Return instructions discussed with patient: Patient counseled to return if they have weakness in their arms or legs, slurred speech, trouble walking or talking, confusion, trouble with their balance, or if they have any other concerns. Patient verbalizes understanding and agrees with plan.   Follow-up instructions discussed with patient: Cardiology when able. I did place referral for outpatient follow-up.                                 Medical Decision Making  Patient here for possible neuro symptoms in setting of recently elevated blood pressure.  He was sent  in by his cardiology office.  Patient with reassuring neuro exam here.  Blood pressure is mildly  elevated, but very low concern for associated endorgan damage and do not feel that he requires emergent treatment.  He can continue to monitor, take all medications and follow-up with cardiology to determine if dosage adjustment or additional medications are needed.  No strokelike symptoms today.  No concern for acute coronary ischemia or heart failure exacerbation.  The patient's vital signs, pertinent lab work and imaging were reviewed and interpreted as discussed in the ED course. Hospitalization was considered for further testing, treatments, or serial exams/observation. However as patient is well-appearing, has a stable exam, and reassuring studies today, I do not feel that they warrant admission at this time. This plan was discussed with the patient who verbalizes agreement and comfort with this plan and seems reliable and able to return to the Emergency Department with worsening or changing symptoms.          Final Clinical Impression(s) / ED Diagnoses Final diagnoses:  Hypertension, unspecified type    Rx / DC Orders ED Discharge Orders          Ordered    Ambulatory referral to Cardiology       Comments: If you have not heard from the Cardiology office within the next 72 hours please call [phone].   04/08/23 1426              Renne Crigler, PA-C 04/08/23 1808    Horton, Clabe Seal, DO 04/09/23 [phone]

## 2023-04-08 NOTE — ED Notes (Signed)
Discharge paperwork reviewed entirely with patient, including follow up care. Pain was under control. No prescriptions were called in, but all questions were addressed.  Pt verbalized understanding as well as all parties involved. No questions or concerns voiced at the time of discharge. No acute distress noted.   Pt ambulated out to PVA without incident or assistance.  

## 2023-04-08 NOTE — ED Triage Notes (Addendum)
Pt sent here by PCP for HTN at home.  Pt states BP has been running high in MD office.  Pt states he needs to be checked.  Pt states his vision has been blurry for 3 days.

## 2023-04-08 NOTE — Telephone Encounter (Signed)
Pt c/o BP issue: STAT if pt c/o blurred vision, one-sided weakness or slurred speech  1. What are your last 5 BP readings? 182/97  2. Are you having any other symptoms (ex. Dizziness, headache, blurred vision, passed out)? Patient states his vision is blurry, and he doesn't feel right.    3. What is your BP issue? Patient was at a doctor's appt yesterday and his BP was high.  They told him to give a call.  He doesn't have any other BP readings to give [place].

## 2023-04-08 NOTE — Telephone Encounter (Signed)
Spoke with patient and he stated every time he has been to the doctor his BP has been elevated.  Yesterday it was 182/97. Today he has blurred vision  and slight confusion. He is unable to check BP at home. Do nit have BP monitor.  I tried to offer appointment he declined. He stated its hard for him to get around. I did advise to go to ED.  I will go talk with DOD.  Dr. Rennis Golden does agree patient should be evaluated since he has symptoms.  Called patient back and advised he call 911 and be evaluated for his symptoms and BP.  He stated why does he need to call 911. He will to med center,. I advised to call 911 since his vision is blurry. Also suggested getting a BP monitor to check BP at home.

## 2023-04-08 NOTE — Discharge Instructions (Signed)
Please read and follow all provided instructions.  Your diagnoses today include:  1. Hypertension, unspecified type     Your blood pressure was high today (BP): BP (!) 160/80 (BP Location: Left Arm)   Pulse (!) 56   Temp 97.9 F (36.6 C) (Oral)   Resp (!) 22   SpO2 94%   Tests performed today include: Vital signs. See below for your results today.   Medications prescribed:  None  Home care instructions:  Follow any educational materials contained in this packet.  Follow-up instructions: Please follow-up with your cardiologist in the next 7 days for a recheck of your symptoms and blood pressure.    Return instructions:  Please return to the Emergency Department if you experience worsening symptoms.  Return with severe chest pain, abdominal pain, or shortness of breath.  Return with severe headache, focal weakness, numbness, difficulty with speech or vision. Return with loss of vision or sudden vision change. Please return if you have any other emergent concerns.  Additional Information:  Your vital signs today were: BP (!) 160/80 (BP Location: Left Arm)   Pulse (!) 56   Temp 97.9 F (36.6 C) (Oral)   Resp (!) 22   SpO2 94%  If your blood pressure (BP) was elevated above 135/85 this visit, please have this repeated by your doctor within one month. --------------

## 2023-04-10 NOTE — Telephone Encounter (Signed)
Patient was seen in the ER

## 2023-04-14 ENCOUNTER — Ambulatory Visit: Payer: Medicare PPO | Admitting: Physician Assistant

## 2023-04-14 VITALS — BP 170/70 | HR 65 | Temp 98.0°F | Resp 18 | Ht 74.0 in | Wt 196.8 lb

## 2023-04-14 DIAGNOSIS — L97229 Non-pressure chronic ulcer of left calf with unspecified severity: Secondary | ICD-10-CM | POA: Diagnosis not present

## 2023-04-14 DIAGNOSIS — I83022 Varicose veins of left lower extremity with ulcer of calf: Secondary | ICD-10-CM | POA: Diagnosis not present

## 2023-04-15 DIAGNOSIS — G4733 Obstructive sleep apnea (adult) (pediatric): Secondary | ICD-10-CM | POA: Diagnosis not present

## 2023-04-17 ENCOUNTER — Other Ambulatory Visit: Payer: Self-pay | Admitting: Adult Health

## 2023-04-20 ENCOUNTER — Ambulatory Visit: Payer: Medicare PPO | Admitting: Physician Assistant

## 2023-04-20 VITALS — BP 173/68 | HR 89 | Temp 97.8°F | Resp 22 | Ht 74.0 in | Wt 195.3 lb

## 2023-04-20 DIAGNOSIS — L97229 Non-pressure chronic ulcer of left calf with unspecified severity: Secondary | ICD-10-CM

## 2023-04-20 DIAGNOSIS — I83022 Varicose veins of left lower extremity with ulcer of calf: Secondary | ICD-10-CM | POA: Diagnosis not present

## 2023-04-21 ENCOUNTER — Ambulatory Visit: Payer: Medicare PPO | Admitting: Internal Medicine

## 2023-04-21 ENCOUNTER — Encounter: Payer: Self-pay | Admitting: Internal Medicine

## 2023-04-21 VITALS — BP 148/80 | HR 72 | Temp 98.7°F | Ht 74.0 in | Wt 198.2 lb

## 2023-04-21 DIAGNOSIS — I10 Essential (primary) hypertension: Secondary | ICD-10-CM | POA: Diagnosis not present

## 2023-04-21 MED ORDER — CARVEDILOL 3.125 MG PO TABS: 3.1250 mg | ORAL_TABLET | Freq: Two times a day (BID) | ORAL | 3 refills | Status: DC

## 2023-04-21 NOTE — Assessment & Plan Note (Signed)
BP is elevated and average elevated at home. Add coreg 3.125 mg BID to his losartan 100 mg daily. Follow up 2-4 weeks with any provider of his and send [place] a log/average of BP at home.

## 2023-04-21 NOTE — Patient Instructions (Addendum)
We will add coreg 3.125 mg twice a day to help the blood pressure be a little lower since it is higher than we want at home. The goal for your blood pressure is 130/80.

## 2023-04-21 NOTE — Progress Notes (Signed)
Subjective:   Patient ID: [name], male    DOB: June 24, 1941, 82 y.o.   MRN: [id number]  HPI The patient is an 82 YO man coming in for high BP lately. Average at home 150/80.   Review of Systems  Constitutional: Negative.   HENT: Negative.    Eyes: Negative.   Respiratory:  Negative for cough, chest tightness and shortness of breath.   Cardiovascular:  Negative for chest pain, palpitations and leg swelling.  Gastrointestinal:  Negative for abdominal distention, abdominal pain, constipation, diarrhea, nausea and vomiting.  Musculoskeletal:  Positive for arthralgias and gait problem.  Skin:  Positive for wound.  Psychiatric/Behavioral: Negative.      Objective:  Physical Exam Constitutional:      Appearance: He is well-developed.  HENT:     Head: Normocephalic and atraumatic.  Cardiovascular:     Rate and Rhythm: Normal rate and regular rhythm.  Pulmonary:     Effort: Pulmonary effort is normal. No respiratory distress.     Breath sounds: Normal breath sounds. No wheezing or rales.     Comments: Oxygen via cannula Abdominal:     General: Bowel sounds are normal. There is no distension.     Palpations: Abdomen is soft.     Tenderness: There is no abdominal tenderness. There is no rebound.  Musculoskeletal:        General: Tenderness present.     Cervical back: Normal range of motion.  Skin:    General: Skin is warm and dry.  Neurological:     Mental Status: He is alert and oriented to person, place, and time.     Coordination: Coordination abnormal.     Vitals:   04/21/23 1408 04/21/23 1411  BP: (!) 148/80 (!) 148/80  Pulse: 72   Temp: 98.7 F (37.1 C)   TempSrc: Oral   SpO2: 95%   Weight: 198 lb 4 oz (89.9 kg)   Height: 6\' 2"  (1.88 m)     Assessment & Plan:

## 2023-04-28 ENCOUNTER — Ambulatory Visit: Payer: Medicare PPO | Admitting: Physician Assistant

## 2023-04-28 ENCOUNTER — Inpatient Hospital Stay: Payer: Medicare PPO | Attending: Hematology and Oncology

## 2023-04-28 ENCOUNTER — Inpatient Hospital Stay: Payer: Medicare PPO | Admitting: Hematology and Oncology

## 2023-04-28 VITALS — BP 148/58 | HR 60 | Temp 97.8°F | Resp 17 | Ht 74.0 in | Wt 197.4 lb

## 2023-04-28 VITALS — BP 158/66 | HR 61 | Temp 97.8°F

## 2023-04-28 DIAGNOSIS — L97229 Non-pressure chronic ulcer of left calf with unspecified severity: Secondary | ICD-10-CM

## 2023-04-28 DIAGNOSIS — Z7902 Long term (current) use of antithrombotics/antiplatelets: Secondary | ICD-10-CM | POA: Diagnosis not present

## 2023-04-28 DIAGNOSIS — Z79890 Hormone replacement therapy (postmenopausal): Secondary | ICD-10-CM | POA: Insufficient documentation

## 2023-04-28 DIAGNOSIS — Z7969 Long term (current) use of other immunomodulators and immunosuppressants: Secondary | ICD-10-CM | POA: Insufficient documentation

## 2023-04-28 DIAGNOSIS — Z79899 Other long term (current) drug therapy: Secondary | ICD-10-CM | POA: Insufficient documentation

## 2023-04-28 DIAGNOSIS — I10 Essential (primary) hypertension: Secondary | ICD-10-CM | POA: Diagnosis not present

## 2023-04-28 DIAGNOSIS — Z7952 Long term (current) use of systemic steroids: Secondary | ICD-10-CM | POA: Diagnosis not present

## 2023-04-28 DIAGNOSIS — I83022 Varicose veins of left lower extremity with ulcer of calf: Secondary | ICD-10-CM | POA: Diagnosis not present

## 2023-04-28 DIAGNOSIS — D693 Immune thrombocytopenic purpura: Secondary | ICD-10-CM | POA: Diagnosis not present

## 2023-04-28 DIAGNOSIS — Z7984 Long term (current) use of oral hypoglycemic drugs: Secondary | ICD-10-CM | POA: Insufficient documentation

## 2023-04-28 LAB — CBC WITH DIFFERENTIAL (CANCER CENTER ONLY)
Abs Immature Granulocytes: 0.02 10*3/uL (ref 0.00–0.07)
Basophils Absolute: 0.1 10*3/uL (ref 0.0–0.1)
Basophils Relative: 1 %
Eosinophils Absolute: 0.1 10*3/uL (ref 0.0–0.5)
Eosinophils Relative: 3 %
HCT: 40 % (ref 39.0–52.0)
Hemoglobin: 12.6 g/dL — ABNORMAL LOW (ref 13.0–17.0)
Immature Granulocytes: 0 %
Lymphocytes Relative: 23 %
Lymphs Abs: 1.2 10*3/uL (ref 0.7–4.0)
MCH: 27.8 pg (ref 26.0–34.0)
MCHC: 31.5 g/dL (ref 30.0–36.0)
MCV: 88.1 fL (ref 80.0–100.0)
Monocytes Absolute: 0.5 10*3/uL (ref 0.1–1.0)
Monocytes Relative: 10 %
Neutro Abs: 3.2 10*3/uL (ref 1.7–7.7)
Neutrophils Relative %: 63 %
Platelet Count: 48 10*3/uL — ABNORMAL LOW (ref 150–400)
RBC: 4.54 MIL/uL (ref 4.22–5.81)
RDW: 14.8 % (ref 11.5–15.5)
WBC Count: 5.2 10*3/uL (ref 4.0–10.5)
nRBC: 0 % (ref 0.0–0.2)

## 2023-04-28 MED ORDER — PREDNISONE 5 MG PO TABS: 10.0000 mg | ORAL_TABLET | Freq: Every day | ORAL | 3 refills | Status: DC

## 2023-04-28 NOTE — Assessment & Plan Note (Signed)
Relapsed acute ITP Prior treatment: Prednisone started 07/05/2019 completed 10/23/2019 Lab review:  02/08/2020: Platelet count 37 with blood with expectoration started on prednisone 02/15/2020: Platelet count 98 03/19/2020: Platelet count: 68 04/16/2020: Platelet count 64 (prednisone dose lowered to 5 mg) 06/25/2020: Platelets 38 07/09/2020: Platelets 73 (continue with 5 mg prednisone) 08/26/2020: Platelets 73 3/22: Platelets 67 (prednisone discontinued) 11/01/2020: Platelets 37 (prednisone 5 mg restarted) 11/29/2020: Platelets 68 03/28/2021: Platelets 54 08/29/2021: Platelets 101 03/27/2022: Platelets 85 06/26/2022: Platelets 86 10/15/2022: Platelets 87 stable 12/25/2022: Platelets 96 04/28/2023: Platelets   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  He has a stent plan for the right leg coming up.   Continue with 5 mg of prednisone Recheck labs in 4 months and follow-up

## 2023-04-28 NOTE — Progress Notes (Signed)
Patient Care Team: Myrlene Broker, MD as PCP - General (Internal Medicine) Regan Lemming, MD as PCP - Electrophysiology (Clinical Cardiac Electrophysiology) Lewayne Bunting, MD as PCP - Cardiology (Cardiology) Ernesto Rutherford, MD as Consulting Physician (Ophthalmology) Kalman Shan, MD as Consulting Physician (Pulmonary Disease) System, Provider Not In as Consulting Physician Casimer Lanius, MD as Consulting Physician (Rheumatology) Oretha Milch, MD as Consulting Physician (Pulmonary Disease) Lewayne Bunting, MD as Consulting Physician (Cardiology)  DIAGNOSIS:  Encounter Diagnosis  Name Primary?   Acute ITP (HCC) Yes   CHIEF COMPLIANT: Follow-up of ITP  Discussed the use of AI scribe software for clinical note transcription with the patient, who gave verbal consent to proceed.  History of Present Illness   The patient, with a history of Immune Thrombocytopenic Purpura (ITP), presents for a routine follow-up. He reports compliance with his daily 5mg  prednisone regimen. However, he has noticed a recent drop in his platelet count to 48, a significant decrease from his usual range of 60-80. He denies any new symptoms or injuries, but does note the presence of small bruises on his hands.  In addition to his ITP, the patient is also being treated for a blister on his foot. He has been wearing a boot and reports that the blister is healing well. He has not been prescribed any antibiotics for this issue.  The patient also mentions a recent issue with high blood pressure, which is being managed by Dr. Okey Dupre with a small dose of an unspecified medication in addition to his regular 100mg  losartan regimen. He reports lower readings since this adjustment.  The patient has received his flu shot and COVID-19 booster shot, and overall feels well. He has a good appetite, sleeps well, and has not been sick.         ALLERGIES:  has No Known Allergies.  MEDICATIONS:   Current Outpatient Medications  Medication Sig Dispense Refill   acetaminophen (TYLENOL) 500 MG tablet Take 1,000 mg by mouth every 6 (six) hours as needed for mild pain.     albuterol (PROVENTIL) (2.5 MG/3ML) 0.083% nebulizer solution Take 3 mLs (2.5 mg total) by nebulization every 6 (six) hours as needed for wheezing or shortness of breath. 120 mL 12   albuterol (VENTOLIN HFA) 108 (90 Base) MCG/ACT inhaler INHALE 2 PUFFS INTO THE LUNGS EVERY 6 HOURS AS NEEDED FOR WHEEZING OR SHORTNESS OF BREATH 54 g 3   atorvastatin (LIPITOR) 40 MG tablet Take 1 tablet (40 mg total) by mouth daily. 90 tablet 3   Carboxymethylcellul-Glycerin (LUBRICATING EYE DROPS OP) Place 1 drop into both eyes 4 (four) times daily as needed (dry eyes).      carvedilol (COREG) 3.125 MG tablet Take 1 tablet (3.125 mg total) by mouth 2 (two) times daily with a meal. 60 tablet 3   Cholecalciferol (VITAMIN D3) 2000 units capsule Take 2,000 Units by mouth daily.      clopidogrel (PLAVIX) 75 MG tablet TAKE 1 TABLET(75 MG) BY MOUTH DAILY 30 tablet 11   Continuous Blood Gluc Receiver (DEXCOM G7 RECEIVER) DEVI Use to check blood sugars 3x a day. DX CODE: E11.69, E78.5 1 each 0   Continuous Blood Gluc Sensor (DEXCOM G7 SENSOR) MISC Use to check blood sugars 3x a day. DX CODE: E11.69, E78.5 3 each 4   Continuous Blood Gluc Transmit (DEXCOM G6 TRANSMITTER) MISC by Does not apply route.     cyanocobalamin (VITAMIN B12) 500 MCG tablet TAKE ONE TABLET BY MOUTH DAILY FOR  LOW B12 LEVEL     empagliflozin (JARDIANCE) 10 MG TABS tablet Take 5 mg by mouth daily. Filled at [place].     ferrous sulfate 324 (65 Fe) MG TBEC Take 1 tablet by mouth daily.     fluticasone (FLONASE) 50 MCG/ACT nasal spray Place 1 spray into both nostrils daily. (Patient taking differently: Place 1 spray into both nostrils daily as needed for allergies.) 18.2 mL 2   imiquimod (ALDARA) 5 % cream APPLY THIN LAYER TO AFFECTED AREA THREE TIMES A WEEK APPLY TO WARTS 3-5 TIMES PER WEEK  FOR 6 WEEKS OR UNTIL WARTS DISAPPEAR     insulin glargine (LANTUS) 100 UNIT/ML injection Inject 30 Units into the skin at bedtime.     leflunomide (ARAVA) 10 MG tablet TAKE ONE TABLET BY MOUTH DAILY FOR RHEUMATOID ARTHRITIS     levothyroxine (SYNTHROID) 125 MCG tablet TAKE ONE TABLET BY MOUTH DAILY FOR THYROID     losartan (COZAAR) 100 MG tablet Take 1 tablet (100 mg total) by mouth daily. 90 tablet 3   metFORMIN (GLUCOPHAGE) 500 MG tablet Take 500-1,000 mg by mouth 2 (two) times daily with a meal. Take 1 tablet (500 mg) by mouth in the morning & take 2 tablets (1000 mg) by mouth in the evening.     Multiple Vitamins-Minerals (MEGA MULTIVITAMIN FOR MEN PO) Take 1 tablet by mouth daily.     OXYGEN Inhale 2 L into the lungs continuous.     Semaglutide (OZEMPIC, 1 MG/DOSE, [place]) Inject 1 mg into the skin every Friday.     sertraline (ZOLOFT) 100 MG tablet TAKE ONE-HALF TABLET BY MOUTH EVERY MORNING AFTER BREAKFAST FOR MENTAL HEALTH     tamsulosin (FLOMAX) 0.4 MG CAPS capsule Take 1 capsule (0.4 mg total) by mouth at bedtime. 30 capsule 0   Tiotropium Bromide-Olodaterol (STIOLTO RESPIMAT) 2.5-2.5 MCG/ACT AERS Inhale 2 puffs into the lungs daily. 4 g 0   traZODone (DESYREL) 50 MG tablet TAKE ONE TABLET BY MOUTH AT BEDTIME FOR INSOMNIA     predniSONE (DELTASONE) 5 MG tablet Take 2 tablets (10 mg total) by mouth daily with breakfast. 180 tablet 3   pregabalin (LYRICA) 75 MG capsule Take 1 capsule (75 mg total) by mouth 2 (two) times daily. (Patient not taking: Reported on 04/21/2023)     No current facility-administered medications for this visit.    PHYSICAL EXAMINATION: ECOG PERFORMANCE STATUS: 1 - Symptomatic but completely ambulatory  Vitals:   04/28/23 1124  BP: (!) 148/58  Pulse: 60  Resp: 17  Temp: 97.8 F (36.6 C)  SpO2: 99%   Filed Weights   04/28/23 1124  Weight: 197 lb 6.4 oz (89.5 kg)      LABORATORY DATA:  I have reviewed the data as listed    Latest Ref Rng & Units  01/02/2023    8:37 AM 03/13/2022    7:36 AM 10/24/2021    8:58 AM  CMP  Glucose 70 - 99 mg/dL 696  89  83   BUN 8 - 27 mg/dL 27  30  18    Creatinine 0.76 - 1.27 mg/dL 2.95  2.84  1.32   Sodium 134 - 144 mmol/L 140  142  144   Potassium 3.5 - 5.2 mmol/L 4.7  4.4  3.9   Chloride 96 - 106 mmol/L 104  104  107   CO2 20 - 29 mmol/L 23     Calcium 8.6 - 10.2 mg/dL 9.0     Total Protein 6.0 - 8.5  g/dL 6.2     Total Bilirubin 0.0 - 1.2 mg/dL 0.5     Alkaline Phos 44 - 121 IU/L 83     AST 0 - 40 IU/L 18     ALT 0 - 44 IU/L 14       Lab Results  Component Value Date   WBC 5.2 04/28/2023   HGB 12.6 (L) 04/28/2023   HCT 40.0 04/28/2023   MCV 88.1 04/28/2023   PLT 48 (L) 04/28/2023   NEUTROABS 3.2 04/28/2023    ASSESSMENT & PLAN:  Acute ITP (HCC) Relapsed acute ITP Prior treatment: Prednisone started 07/05/2019 completed 10/23/2019 Lab review:  02/08/2020: Platelet count 37 with blood with expectoration started on prednisone 02/15/2020: Platelet count 98 03/19/2020: Platelet count: 68 04/16/2020: Platelet count 64 (prednisone dose lowered to 5 mg) 06/25/2020: Platelets 38 07/09/2020: Platelets 73 (continue with 5 mg prednisone) 08/26/2020: Platelets 73 3/22: Platelets 67 (prednisone discontinued) 11/01/2020: Platelets 37 (prednisone 5 mg restarted) 11/29/2020: Platelets 68 03/28/2021: Platelets 54 08/29/2021: Platelets 101 03/27/2022: Platelets 85 06/26/2022: Platelets 86 10/15/2022: Platelets 87 stable 12/25/2022: Platelets 96 04/28/2023: Platelets 48   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  He has a stent plan for the right leg coming up.      Idiopathic Thrombocytopenic Purpura (ITP) Noted significant drop in platelet count to 48 from usual range of 60-80. No new symptoms or changes in health status identified that could explain this drop. Currently on Prednisone 5mg  daily. -Increase Prednisone  to 10mg  daily for one month. -Recheck platelet count in one month. -If platelet count improves, reduce Prednisone back to 5mg  daily and monitor.  Skin Integrity Healing blister on foot, currently being treated with wound care and boot application. No signs of infection. -Continue current wound care regimen.  Hypertension Recent adjustment in antihypertensive medication by Dr. Okey Dupre due to elevated blood pressure readings. Currently on Losartan 100mg  daily. -Continue current antihypertensive regimen as prescribed by Dr. Okey Dupre.  General Health Maintenance -Received influenza and COVID-19 booster vaccinations. -Follow up in one month to recheck platelet count.          Orders Placed This Encounter  Procedures   CBC with Differential (Cancer Center Only)    Standing Status:   Future    Standing Expiration Date:   04/27/2024   The patient has a good understanding of the overall plan. he agrees with it. he will call with any problems that may develop before the next visit here. Total time spent: 30 mins including face to face time and time spent for planning, charting and co-ordination of care   Tamsen Meek, MD 04/28/23

## 2023-04-28 NOTE — Progress Notes (Signed)
Pt here for unna boot change today.    Wound definitely improving as pictured.    Will give him one more week in the unna boot and this ulceration should be healed at that time.  He will need compression sock once wound has healed.     Doreatha Massed, [facility] 04/28/2023 3:54 PM

## 2023-05-05 ENCOUNTER — Ambulatory Visit: Payer: Medicare PPO | Admitting: Physician Assistant

## 2023-05-05 ENCOUNTER — Encounter (HOSPITAL_COMMUNITY): Payer: Medicare PPO

## 2023-05-05 ENCOUNTER — Ambulatory Visit: Payer: Medicare PPO | Admitting: Vascular Surgery

## 2023-05-05 DIAGNOSIS — I83022 Varicose veins of left lower extremity with ulcer of calf: Secondary | ICD-10-CM | POA: Diagnosis not present

## 2023-05-05 DIAGNOSIS — L97229 Non-pressure chronic ulcer of left calf with unspecified severity: Secondary | ICD-10-CM

## 2023-05-05 NOTE — Progress Notes (Signed)
CC:  F/u for surgery  HPI:  This is a 82 y.o. male who is here for exam of a right venous ulcer with serial unna boot changes.  He denies claudication, rest pain or new non healing wounds.    Pt returns today for follow up.    No Known Allergies  Current Outpatient Medications  Medication Sig Dispense Refill   acetaminophen (TYLENOL) 500 MG tablet Take 1,000 mg by mouth every 6 (six) hours as needed for mild pain.     albuterol (PROVENTIL) (2.5 MG/3ML) 0.083% nebulizer solution Take 3 mLs (2.5 mg total) by nebulization every 6 (six) hours as needed for wheezing or shortness of breath. 120 mL 12   albuterol (VENTOLIN HFA) 108 (90 Base) MCG/ACT inhaler INHALE 2 PUFFS INTO THE LUNGS EVERY 6 HOURS AS NEEDED FOR WHEEZING OR SHORTNESS OF BREATH 54 g 3   atorvastatin (LIPITOR) 40 MG tablet Take 1 tablet (40 mg total) by mouth daily. 90 tablet 3   Carboxymethylcellul-Glycerin (LUBRICATING EYE DROPS OP) Place 1 drop into both eyes 4 (four) times daily as needed (dry eyes).      carvedilol (COREG) 3.125 MG tablet Take 1 tablet (3.125 mg total) by mouth 2 (two) times daily with a meal. 60 tablet 3   Cholecalciferol (VITAMIN D3) 2000 units capsule Take 2,000 Units by mouth daily.      clopidogrel (PLAVIX) 75 MG tablet TAKE 1 TABLET(75 MG) BY MOUTH DAILY 30 tablet 11   Continuous Blood Gluc Receiver (DEXCOM G7 RECEIVER) DEVI Use to check blood sugars 3x a day. DX CODE: E11.69, E78.5 1 each 0   Continuous Blood Gluc Sensor (DEXCOM G7 SENSOR) MISC Use to check blood sugars 3x a day. DX CODE: E11.69, E78.5 3 each 4   Continuous Blood Gluc Transmit (DEXCOM G6 TRANSMITTER) MISC by Does not apply route.     cyanocobalamin (VITAMIN B12) 500 MCG tablet TAKE ONE TABLET BY MOUTH DAILY FOR LOW B12 LEVEL     empagliflozin (JARDIANCE) 10 MG TABS tablet Take 5 mg by mouth daily. Filled at [place].     ferrous sulfate 324 (65 Fe) MG TBEC Take 1 tablet by mouth daily.     fluticasone (FLONASE) 50 MCG/ACT nasal  spray Place 1 spray into both nostrils daily. (Patient taking differently: Place 1 spray into both nostrils daily as needed for allergies.) 18.2 mL 2   imiquimod (ALDARA) 5 % cream APPLY THIN LAYER TO AFFECTED AREA THREE TIMES A WEEK APPLY TO WARTS 3-5 TIMES PER WEEK FOR 6 WEEKS OR UNTIL WARTS DISAPPEAR     insulin glargine (LANTUS) 100 UNIT/ML injection Inject 30 Units into the skin at bedtime.     leflunomide (ARAVA) 10 MG tablet TAKE ONE TABLET BY MOUTH DAILY FOR RHEUMATOID ARTHRITIS     levothyroxine (SYNTHROID) 125 MCG tablet TAKE ONE TABLET BY MOUTH DAILY FOR THYROID     losartan (COZAAR) 100 MG tablet Take 1 tablet (100 mg total) by mouth daily. 90 tablet 3   metFORMIN (GLUCOPHAGE) 500 MG tablet Take 500-1,000 mg by mouth 2 (two) times daily with a meal. Take 1 tablet (500 mg) by mouth in the morning & take 2 tablets (1000 mg) by mouth in the evening.     Multiple Vitamins-Minerals (MEGA MULTIVITAMIN FOR MEN PO) Take 1 tablet by mouth daily.     OXYGEN Inhale 2 L into the lungs continuous.     predniSONE (DELTASONE) 5 MG tablet Take 2 tablets (10 mg total)  by mouth daily with breakfast. 180 tablet 3   pregabalin (LYRICA) 75 MG capsule Take 1 capsule (75 mg total) by mouth 2 (two) times daily.     Semaglutide (OZEMPIC, 1 MG/DOSE, [place]) Inject 1 mg into the skin every Friday.     sertraline (ZOLOFT) 100 MG tablet TAKE ONE-HALF TABLET BY MOUTH EVERY MORNING AFTER BREAKFAST FOR MENTAL HEALTH     tamsulosin (FLOMAX) 0.4 MG CAPS capsule Take 1 capsule (0.4 mg total) by mouth at bedtime. 30 capsule 0   Tiotropium Bromide-Olodaterol (STIOLTO RESPIMAT) 2.5-2.5 MCG/ACT AERS Inhale 2 puffs into the lungs daily. 4 g 0   traZODone (DESYREL) 50 MG tablet TAKE ONE TABLET BY MOUTH AT BEDTIME FOR INSOMNIA     No current facility-administered medications for this visit.     ROS:  See HPI  Physical Exam:    Healing well with decrease in size     Assessment/Plan:  This is a 82 y.o. 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   HH was ordered.  If he does not qualify for [facility] una boot changes he will  return in 1 week for exam and change.  He tolerated the dressing and he will elevate his leg multiple times a day when at rest.     Doreatha Massed, [facility] [phone]   Clinic MD:  Chest[facility] Spore

## 2023-05-08 DIAGNOSIS — G4733 Obstructive sleep apnea (adult) (pediatric): Secondary | ICD-10-CM | POA: Diagnosis not present

## 2023-05-11 DIAGNOSIS — J449 Chronic obstructive pulmonary disease, unspecified: Secondary | ICD-10-CM | POA: Diagnosis not present

## 2023-05-12 ENCOUNTER — Ambulatory Visit: Payer: Medicare PPO | Admitting: Physician Assistant

## 2023-05-12 ENCOUNTER — Encounter: Payer: Self-pay | Admitting: Physician Assistant

## 2023-05-12 VITALS — BP 161/64 | HR 64 | Resp 22 | Ht 74.0 in | Wt 199.7 lb

## 2023-05-12 DIAGNOSIS — L97229 Non-pressure chronic ulcer of left calf with unspecified severity: Secondary | ICD-10-CM

## 2023-05-12 DIAGNOSIS — I83022 Varicose veins of left lower extremity with ulcer of calf: Secondary | ICD-10-CM | POA: Diagnosis not present

## 2023-05-12 NOTE — Progress Notes (Signed)
Unna boot applied to left calf. Patient tolerated well  No c/o while applying boot or after application.

## 2023-05-14 ENCOUNTER — Ambulatory Visit: Payer: Medicare PPO | Admitting: General Practice

## 2023-05-19 ENCOUNTER — Ambulatory Visit: Payer: Medicare PPO | Admitting: Physician Assistant

## 2023-05-19 VITALS — BP 175/90 | HR 62 | Temp 98.4°F | Resp 22 | Ht 74.0 in | Wt 197.0 lb

## 2023-05-19 DIAGNOSIS — M7989 Other specified soft tissue disorders: Secondary | ICD-10-CM

## 2023-05-19 DIAGNOSIS — L97229 Non-pressure chronic ulcer of left calf with unspecified severity: Secondary | ICD-10-CM | POA: Diagnosis not present

## 2023-05-19 DIAGNOSIS — I83022 Varicose veins of left lower extremity with ulcer of calf: Secondary | ICD-10-CM | POA: Diagnosis not present

## 2023-05-19 NOTE — Progress Notes (Signed)
CC:  F/u for surgery  HPI:  This is a 82 y.o. male who is here for exam of a right venous ulcer with serial unna boot changes. He denies claudication, rest pain or new non healing wounds.    No Known Allergies  Current Outpatient Medications  Medication Sig Dispense Refill   acetaminophen (TYLENOL) 500 MG tablet Take 1,000 mg by mouth every 6 (six) hours as needed for mild pain.     albuterol (PROVENTIL) (2.5 MG/3ML) 0.083% nebulizer solution Take 3 mLs (2.5 mg total) by nebulization every 6 (six) hours as needed for wheezing or shortness of breath. 120 mL 12   albuterol (VENTOLIN HFA) 108 (90 Base) MCG/ACT inhaler INHALE 2 PUFFS INTO THE LUNGS EVERY 6 HOURS AS NEEDED FOR WHEEZING OR SHORTNESS OF BREATH 54 g 3   atorvastatin (LIPITOR) 40 MG tablet Take 1 tablet (40 mg total) by mouth daily. 90 tablet 3   Carboxymethylcellul-Glycerin (LUBRICATING EYE DROPS OP) Place 1 drop into both eyes 4 (four) times daily as needed (dry eyes).      carvedilol (COREG) 3.125 MG tablet Take 1 tablet (3.125 mg total) by mouth 2 (two) times daily with a meal. 60 tablet 3   Cholecalciferol (VITAMIN D3) 2000 units capsule Take 2,000 Units by mouth daily.      clopidogrel (PLAVIX) 75 MG tablet TAKE 1 TABLET(75 MG) BY MOUTH DAILY 30 tablet 11   Continuous Blood Gluc Receiver (DEXCOM G7 RECEIVER) DEVI Use to check blood sugars 3x a day. DX CODE: E11.69, E78.5 1 each 0   Continuous Blood Gluc Sensor (DEXCOM G7 SENSOR) MISC Use to check blood sugars 3x a day. DX CODE: E11.69, E78.5 3 each 4   Continuous Blood Gluc Transmit (DEXCOM G6 TRANSMITTER) MISC by Does not apply route.     cyanocobalamin (VITAMIN B12) 500 MCG tablet TAKE ONE TABLET BY MOUTH DAILY FOR LOW B12 LEVEL     empagliflozin (JARDIANCE) 10 MG TABS tablet Take 5 mg by mouth daily. Filled at [place].     ferrous sulfate 324 (65 Fe) MG TBEC Take 1 tablet by mouth daily.     fluticasone (FLONASE) 50 MCG/ACT nasal spray Place 1 spray into both nostrils  daily. (Patient taking differently: Place 1 spray into both nostrils daily as needed for allergies.) 18.2 mL 2   imiquimod (ALDARA) 5 % cream APPLY THIN LAYER TO AFFECTED AREA THREE TIMES A WEEK APPLY TO WARTS 3-5 TIMES PER WEEK FOR 6 WEEKS OR UNTIL WARTS DISAPPEAR     insulin glargine (LANTUS) 100 UNIT/ML injection Inject 30 Units into the skin at bedtime.     leflunomide (ARAVA) 10 MG tablet TAKE ONE TABLET BY MOUTH DAILY FOR RHEUMATOID ARTHRITIS     levothyroxine (SYNTHROID) 125 MCG tablet TAKE ONE TABLET BY MOUTH DAILY FOR THYROID     losartan (COZAAR) 100 MG tablet Take 1 tablet (100 mg total) by mouth daily. 90 tablet 3   metFORMIN (GLUCOPHAGE) 500 MG tablet Take 500-1,000 mg by mouth 2 (two) times daily with a meal. Take 1 tablet (500 mg) by mouth in the morning & take 2 tablets (1000 mg) by mouth in the evening.     Multiple Vitamins-Minerals (MEGA MULTIVITAMIN FOR MEN PO) Take 1 tablet by mouth daily.     OXYGEN Inhale 2 L into the lungs continuous.     predniSONE (DELTASONE) 5 MG tablet Take 2 tablets (10 mg total) by mouth daily with breakfast. 180 tablet 3  pregabalin (LYRICA) 75 MG capsule Take 1 capsule (75 mg total) by mouth 2 (two) times daily.     Semaglutide (OZEMPIC, 1 MG/DOSE, [place]) Inject 1 mg into the skin every Friday.     sertraline (ZOLOFT) 100 MG tablet TAKE ONE-HALF TABLET BY MOUTH EVERY MORNING AFTER BREAKFAST FOR MENTAL HEALTH     tamsulosin (FLOMAX) 0.4 MG CAPS capsule Take 1 capsule (0.4 mg total) by mouth at bedtime. 30 capsule 0   Tiotropium Bromide-Olodaterol (STIOLTO RESPIMAT) 2.5-2.5 MCG/ACT AERS Inhale 2 puffs into the lungs daily. 4 g 0   traZODone (DESYREL) 50 MG tablet TAKE ONE TABLET BY MOUTH AT BEDTIME FOR INSOMNIA     No current facility-administered medications for this visit.     ROS:  See HPI  Physical Exam:     Extremities:  he has doppler PT/peroneal signals  Neuro: motor and sensation intact Lungs:  non labored  breathing    Assessment/Plan:  This is a 82 y.o. male who is s/p:venous stasis ulcer No sign of infection.  He has compression that he will wear at this point with elevation and exercise to the best of his abilities.    -f/u PRN    If his wounds return and/or get worse with increased edema he will need to return to [facility] boot therapy.                                                           Mosetta Pigeon PA-C [facility] [phone]   Clinic MD:  Chestine Spore

## 2023-06-04 ENCOUNTER — Inpatient Hospital Stay: Payer: Medicare PPO | Attending: Hematology and Oncology

## 2023-06-04 ENCOUNTER — Inpatient Hospital Stay: Payer: Medicare PPO | Admitting: Hematology and Oncology

## 2023-06-04 VITALS — BP 139/76 | HR 60 | Temp 97.8°F | Resp 18 | Ht 74.0 in | Wt 199.9 lb

## 2023-06-04 DIAGNOSIS — D693 Immune thrombocytopenic purpura: Secondary | ICD-10-CM | POA: Diagnosis not present

## 2023-06-04 DIAGNOSIS — Z7984 Long term (current) use of oral hypoglycemic drugs: Secondary | ICD-10-CM | POA: Insufficient documentation

## 2023-06-04 DIAGNOSIS — Z7952 Long term (current) use of systemic steroids: Secondary | ICD-10-CM | POA: Insufficient documentation

## 2023-06-04 DIAGNOSIS — Z79890 Hormone replacement therapy (postmenopausal): Secondary | ICD-10-CM | POA: Insufficient documentation

## 2023-06-04 DIAGNOSIS — Z7902 Long term (current) use of antithrombotics/antiplatelets: Secondary | ICD-10-CM | POA: Insufficient documentation

## 2023-06-04 DIAGNOSIS — Z7969 Long term (current) use of other immunomodulators and immunosuppressants: Secondary | ICD-10-CM | POA: Insufficient documentation

## 2023-06-04 DIAGNOSIS — Z79899 Other long term (current) drug therapy: Secondary | ICD-10-CM | POA: Diagnosis not present

## 2023-06-04 LAB — CBC WITH DIFFERENTIAL (CANCER CENTER ONLY)
Abs Immature Granulocytes: 0.02 10*3/uL (ref 0.00–0.07)
Basophils Absolute: 0 10*3/uL (ref 0.0–0.1)
Basophils Relative: 1 %
Eosinophils Absolute: 0 10*3/uL (ref 0.0–0.5)
Eosinophils Relative: 1 %
HCT: 40.8 % (ref 39.0–52.0)
Hemoglobin: 13 g/dL (ref 13.0–17.0)
Immature Granulocytes: 0 %
Lymphocytes Relative: 14 %
Lymphs Abs: 0.9 10*3/uL (ref 0.7–4.0)
MCH: 27.8 pg (ref 26.0–34.0)
MCHC: 31.9 g/dL (ref 30.0–36.0)
MCV: 87.4 fL (ref 80.0–100.0)
Monocytes Absolute: 0.4 10*3/uL (ref 0.1–1.0)
Monocytes Relative: 6 %
Neutro Abs: 5.2 10*3/uL (ref 1.7–7.7)
Neutrophils Relative %: 78 %
Platelet Count: 36 10*3/uL — ABNORMAL LOW (ref 150–400)
RBC: 4.67 MIL/uL (ref 4.22–5.81)
RDW: 15 % (ref 11.5–15.5)
WBC Count: 6.6 10*3/uL (ref 4.0–10.5)
nRBC: 0 % (ref 0.0–0.2)

## 2023-06-04 MED ORDER — PREDNISONE 10 MG PO TABS: 20.0000 mg | ORAL_TABLET | Freq: Every day | ORAL | 2 refills | Status: DC

## 2023-06-04 NOTE — Assessment & Plan Note (Addendum)
Relapsed acute ITP Prior treatment: Prednisone started 07/05/2019 completed 10/23/2019 Lab review:  02/08/2020: Platelet count 37 with blood with expectoration started on prednisone 02/15/2020: Platelet count 98 03/19/2020: Platelet count: 68 04/16/2020: Platelet count 64 (prednisone dose lowered to 5 mg) 06/25/2020: Platelets 38 07/09/2020: Platelets 73 (continue with 5 mg prednisone) 08/26/2020: Platelets 73 3/22: Platelets 67 (prednisone discontinued) 11/01/2020: Platelets 37 (prednisone 5 mg restarted) 11/29/2020: Platelets 68 10/15/2022: Platelets 87 stable 12/25/2022: Platelets 96 04/28/2023: Platelets 48 (prednisone dose increased to 10 mg) 06/04/2023: Platelets 36 (prednisone inc to 20 mg)   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

## 2023-06-04 NOTE — Progress Notes (Signed)
Patient Care Team: Myrlene Broker, MD as PCP - General (Internal Medicine) Regan Lemming, MD as PCP - Electrophysiology (Clinical Cardiac Electrophysiology) Lewayne Bunting, MD as PCP - Cardiology (Cardiology) Ernesto Rutherford, MD as Consulting Physician (Ophthalmology) Kalman Shan, MD as Consulting Physician (Pulmonary Disease) System, Provider Not In as Consulting Physician Casimer Lanius, MD as Consulting Physician (Rheumatology) Oretha Milch, MD as Consulting Physician (Pulmonary Disease) Lewayne Bunting, MD as Consulting Physician (Cardiology)  DIAGNOSIS:  Encounter Diagnosis  Name Primary?   Acute ITP (HCC) Yes      CHIEF COMPLIANT: F/U of ITP  HISTORY OF PRESENT ILLNESS:   History of Present Illness   The patient, with a history of Immune Thrombocytopenic Purpura (ITP), presents for a follow-up visit. He denies any bleeding, bruising, or nosebleeds. He reports that his prednisone dose was recently increased to 10mg , but he believes he needs a higher dose. He notes a drop in his platelet count from 90 to 50, and then a further drop, although not as significant. He is currently on a small dose of prednisone and is open to increasing the dose to 20mg   . He denies any sleep disturbances related to the prednisone. He also mentions having issues with his health insurance regarding medication refills, but is willing to pay out of pocket if necessary.         ALLERGIES:  has No Known Allergies.  MEDICATIONS:  Current Outpatient Medications  Medication Sig Dispense Refill   acetaminophen (TYLENOL) 500 MG tablet Take 1,000 mg by mouth every 6 (six) hours as needed for mild pain.     albuterol (PROVENTIL) (2.5 MG/3ML) 0.083% nebulizer solution Take 3 mLs (2.5 mg total) by nebulization every 6 (six) hours as needed for wheezing or shortness of breath. 120 mL 12   albuterol (VENTOLIN HFA) 108 (90 Base) MCG/ACT inhaler INHALE 2 PUFFS INTO THE LUNGS EVERY 6 HOURS  AS NEEDED FOR WHEEZING OR SHORTNESS OF BREATH 54 g 3   atorvastatin (LIPITOR) 40 MG tablet Take 1 tablet (40 mg total) by mouth daily. 90 tablet 3   Carboxymethylcellul-Glycerin (LUBRICATING EYE DROPS OP) Place 1 drop into both eyes 4 (four) times daily as needed (dry eyes).      carvedilol (COREG) 3.125 MG tablet Take 1 tablet (3.125 mg total) by mouth 2 (two) times daily with a meal. 60 tablet 3   Cholecalciferol (VITAMIN D3) 2000 units capsule Take 2,000 Units by mouth daily.      clopidogrel (PLAVIX) 75 MG tablet TAKE 1 TABLET(75 MG) BY MOUTH DAILY 30 tablet 11   Continuous Blood Gluc Receiver (DEXCOM G7 RECEIVER) DEVI Use to check blood sugars 3x a day. DX CODE: E11.69, E78.5 1 each 0   Continuous Blood Gluc Sensor (DEXCOM G7 SENSOR) MISC Use to check blood sugars 3x a day. DX CODE: E11.69, E78.5 3 each 4   Continuous Blood Gluc Transmit (DEXCOM G6 TRANSMITTER) MISC by Does not apply route.     cyanocobalamin (VITAMIN B12) 500 MCG tablet TAKE ONE TABLET BY MOUTH DAILY FOR LOW B12 LEVEL     empagliflozin (JARDIANCE) 10 MG TABS tablet Take 5 mg by mouth daily. Filled at [place].     ferrous sulfate 324 (65 Fe) MG TBEC Take 1 tablet by mouth daily.     fluticasone (FLONASE) 50 MCG/ACT nasal spray Place 1 spray into both nostrils daily. (Patient taking differently: Place 1 spray into both nostrils daily as needed for allergies.) 18.2 mL 2  imiquimod (ALDARA) 5 % cream APPLY THIN LAYER TO AFFECTED AREA THREE TIMES A WEEK APPLY TO WARTS 3-5 TIMES PER WEEK FOR 6 WEEKS OR UNTIL WARTS DISAPPEAR     insulin glargine (LANTUS) 100 UNIT/ML injection Inject 30 Units into the skin at bedtime.     leflunomide (ARAVA) 10 MG tablet TAKE ONE TABLET BY MOUTH DAILY FOR RHEUMATOID ARTHRITIS     levothyroxine (SYNTHROID) 125 MCG tablet TAKE ONE TABLET BY MOUTH DAILY FOR THYROID     losartan (COZAAR) 100 MG tablet Take 1 tablet (100 mg total) by mouth daily. 90 tablet 3   metFORMIN (GLUCOPHAGE) 500 MG tablet Take  500-1,000 mg by mouth 2 (two) times daily with a meal. Take 1 tablet (500 mg) by mouth in the morning & take 2 tablets (1000 mg) by mouth in the evening.     Multiple Vitamins-Minerals (MEGA MULTIVITAMIN FOR MEN PO) Take 1 tablet by mouth daily.     OXYGEN Inhale 2 L into the lungs continuous.     predniSONE (DELTASONE) 10 MG tablet Take 2 tablets (20 mg total) by mouth daily with breakfast. 120 tablet 2   pregabalin (LYRICA) 75 MG capsule Take 1 capsule (75 mg total) by mouth 2 (two) times daily.     Semaglutide (OZEMPIC, 1 MG/DOSE, [place]) Inject 1 mg into the skin every Friday.     sertraline (ZOLOFT) 100 MG tablet TAKE ONE-HALF TABLET BY MOUTH EVERY MORNING AFTER BREAKFAST FOR MENTAL HEALTH     tamsulosin (FLOMAX) 0.4 MG CAPS capsule Take 1 capsule (0.4 mg total) by mouth at bedtime. 30 capsule 0   Tiotropium Bromide-Olodaterol (STIOLTO RESPIMAT) 2.5-2.5 MCG/ACT AERS Inhale 2 puffs into the lungs daily. 4 g 0   traZODone (DESYREL) 50 MG tablet TAKE ONE TABLET BY MOUTH AT BEDTIME FOR INSOMNIA     No current facility-administered medications for this visit.    PHYSICAL EXAMINATION: ECOG PERFORMANCE STATUS: 1 - Symptomatic but completely ambulatory  Vitals:   06/04/23 1535  BP: 139/76  Pulse: 60  Resp: 18  Temp: 97.8 F (36.6 C)  SpO2: 100%   Filed Weights   06/04/23 1535  Weight: 199 lb 14.4 oz (90.7 kg)      LABORATORY DATA:  I have reviewed the data as listed    Latest Ref Rng & Units 01/02/2023    8:37 AM 03/13/2022    7:36 AM 10/24/2021    8:58 AM  CMP  Glucose 70 - 99 mg/dL 578  89  83   BUN 8 - 27 mg/dL 27  30  18    Creatinine 0.76 - 1.27 mg/dL 4.69  6.29  5.28   Sodium 134 - 144 mmol/L 140  142  144   Potassium 3.5 - 5.2 mmol/L 4.7  4.4  3.9   Chloride 96 - 106 mmol/L 104  104  107   CO2 20 - 29 mmol/L 23     Calcium 8.6 - 10.2 mg/dL 9.0     Total Protein 6.0 - 8.5 g/dL 6.2     Total Bilirubin 0.0 - 1.2 mg/dL 0.5     Alkaline Phos 44 - 121 IU/L 83     AST 0 - 40  IU/L 18     ALT 0 - 44 IU/L 14       Lab Results  Component Value Date   WBC 6.6 06/04/2023   HGB 13.0 06/04/2023   HCT 40.8 06/04/2023   MCV 87.4 06/04/2023   PLT 36 (L) 06/04/2023  NEUTROABS 5.2 06/04/2023    ASSESSMENT & PLAN:  Acute ITP (HCC) Relapsed acute ITP Prior treatment: Prednisone started 07/05/2019 completed 10/23/2019 Lab review:  02/08/2020: Platelet count 37 with blood with expectoration started on prednisone 02/15/2020: Platelet count 98 03/19/2020: Platelet count: 68 04/16/2020: Platelet count 64 (prednisone dose lowered to 5 mg) 06/25/2020: Platelets 38 07/09/2020: Platelets 73 (continue with 5 mg prednisone) 08/26/2020: Platelets 73 3/22: Platelets 67 (prednisone discontinued) 11/01/2020: Platelets 37 (prednisone 5 mg restarted) 11/29/2020: Platelets 68 10/15/2022: Platelets 87 stable 12/25/2022: Platelets 96 04/28/2023: Platelets 48 (prednisone dose increased to 10 mg) 06/04/2023: Platelets 36 (prednisone inc to 20 mg)   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             ------------------------------------- Assessment and Plan    Idiopathic Thrombocytopenic Purpura (ITP) Platelet count has decreased from 90 to 48, and now to 36. No bleeding or nosebleeds reported. Currently on Prednisone 10mg  daily, Discussed the option of increasing the dose to 20mg  or even 40mg  daily, with the understanding that higher doses may affect sleep. -Increase Prednisone to 20mg  daily. -Send a new prescription for Prednisone 10mg  tablets, instructing the patient to take two tablets daily. -Recheck platelet count in one month (December 2024).        Orders Placed This Encounter  Procedures   CBC with Differential (Cancer Center Only)    Standing Status:   Future    Standing Expiration Date:   06/03/2024   The patient has a good understanding of the overall plan. he agrees with it. he  will call with any problems that may develop before the next visit here. Total time spent: 30 mins including face to face time and time spent for planning, charting and co-ordination of care   Tamsen Meek, MD 06/04/23

## 2023-06-07 DIAGNOSIS — G4733 Obstructive sleep apnea (adult) (pediatric): Secondary | ICD-10-CM | POA: Diagnosis not present

## 2023-06-23 ENCOUNTER — Telehealth: Payer: Self-pay | Admitting: Cardiology

## 2023-06-23 NOTE — Telephone Encounter (Signed)
Patient is requesting to switch from Dr. Jens Som to Dr. Vincent Gros. Please advise.

## 2023-06-26 ENCOUNTER — Ambulatory Visit (INDEPENDENT_AMBULATORY_CARE_PROVIDER_SITE_OTHER): Payer: Medicare PPO

## 2023-06-26 DIAGNOSIS — I442 Atrioventricular block, complete: Secondary | ICD-10-CM | POA: Diagnosis not present

## 2023-06-27 LAB — CUP PACEART REMOTE DEVICE CHECK
Battery Remaining Longevity: 107 mo
Battery Voltage: 2.99 V
Brady Statistic AP VP Percent: 78.18 %
Brady Statistic AP VS Percent: 0.32 %
Brady Statistic AS VP Percent: 9.26 %
Brady Statistic AS VS Percent: 12.25 %
Brady Statistic RA Percent Paced: 84.98 %
Brady Statistic RV Percent Paced: 87.44 %
Date Time Interrogation Session: 20241129023410
Implantable Lead Connection Status: 753985
Implantable Lead Connection Status: 753985
Implantable Lead Implant Date: 20201204
Implantable Lead Implant Date: 20201204
Implantable Lead Location: 753859
Implantable Lead Location: 753860
Implantable Lead Model: 5076
Implantable Lead Model: 5076
Implantable Pulse Generator Implant Date: 20201204
Lead Channel Impedance Value: 323 Ohm
Lead Channel Impedance Value: 323 Ohm
Lead Channel Impedance Value: 380 Ohm
Lead Channel Impedance Value: 399 Ohm
Lead Channel Pacing Threshold Amplitude: 0.5 V
Lead Channel Pacing Threshold Amplitude: 0.5 V
Lead Channel Pacing Threshold Pulse Width: 0.4 ms
Lead Channel Pacing Threshold Pulse Width: 0.4 ms
Lead Channel Sensing Intrinsic Amplitude: 2.75 mV
Lead Channel Sensing Intrinsic Amplitude: 2.75 mV
Lead Channel Sensing Intrinsic Amplitude: 23.75 mV
Lead Channel Sensing Intrinsic Amplitude: 23.75 mV
Lead Channel Setting Pacing Amplitude: 1.5 V
Lead Channel Setting Pacing Amplitude: 2.5 V
Lead Channel Setting Pacing Pulse Width: 0.4 ms
Lead Channel Setting Sensing Sensitivity: 2 mV
Zone Setting Status: 755011
Zone Setting Status: 755011

## 2023-06-29 ENCOUNTER — Ambulatory Visit: Payer: Medicare PPO | Admitting: Adult Health

## 2023-06-29 ENCOUNTER — Encounter: Payer: Self-pay | Admitting: Adult Health

## 2023-06-29 VITALS — BP 170/88 | HR 60 | Temp 97.5°F | Ht 74.0 in | Wt 197.0 lb

## 2023-06-29 DIAGNOSIS — R918 Other nonspecific abnormal finding of lung field: Secondary | ICD-10-CM | POA: Diagnosis not present

## 2023-06-29 DIAGNOSIS — J9611 Chronic respiratory failure with hypoxia: Secondary | ICD-10-CM

## 2023-06-29 DIAGNOSIS — R5381 Other malaise: Secondary | ICD-10-CM | POA: Diagnosis not present

## 2023-06-29 DIAGNOSIS — J449 Chronic obstructive pulmonary disease, unspecified: Secondary | ICD-10-CM | POA: Diagnosis not present

## 2023-06-29 DIAGNOSIS — I10 Essential (primary) hypertension: Secondary | ICD-10-CM | POA: Diagnosis not present

## 2023-06-29 DIAGNOSIS — G4733 Obstructive sleep apnea (adult) (pediatric): Secondary | ICD-10-CM | POA: Diagnosis not present

## 2023-06-29 NOTE — Patient Instructions (Signed)
Continue on Stiolto 2 puffs daily .  Albuterol inhaler /nebulizer As needed   Mucinex DM Twice daily  As needed  Cough/congestion .  Continue on Oxygen 2l/m rest and 3l/m with activity .  Allegra daily As needed   Flonase daily As needed   Continue on CPAP At bedtime wear with Naps.  Activity as tolerated.  Saline nasal rinses Twice daily   Saline nasal gel At bedtime .  Follow up in 4 Months  with Dr. Elsworth Soho or Freddye Cardamone NP and As needed   Please contact office for sooner follow up if symptoms do not improve or worsen or seek emergency care

## 2023-06-29 NOTE — Assessment & Plan Note (Signed)
Relapsed acute ITP Prior treatment: Prednisone started 07/05/2019 completed 10/23/2019 Lab review:  02/08/2020: Platelet count 37 with blood with expectoration started on prednisone 02/15/2020: Platelet count 98 03/19/2020: Platelet count: 68 04/16/2020: Platelet count 64 (prednisone dose lowered to 5 mg) 06/25/2020: Platelets 38 07/09/2020: Platelets 73 (continue with 5 mg prednisone) 08/26/2020: Platelets 73 3/22: Platelets 67 (prednisone discontinued) 11/01/2020: Platelets 37 (prednisone 5 mg restarted) 11/29/2020: Platelets 68 10/15/2022: Platelets 87 stable 12/25/2022: Platelets 96 04/28/2023: Platelets 48 (prednisone dose increased to 10 mg) 06/04/2023: Platelets 36 (prednisone inc to 20 mg) 06/29/2023: Platelets   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

## 2023-06-29 NOTE — Progress Notes (Signed)
@Patient  ID: [name], male    DOB: 1940/11/26, 82 y.o.   MRN: [id number]  Chief Complaint  Patient presents with   Follow-up    Referring provider: Myrlene Broker, *  Discussed the use of AI scribe software for clinical note transcription with the patient, who gave verbal consent to proceed.  HPI: 82 year old male former smoker followed for very severe COPD, chronic hypoxic respiratory failure on oxygen, sleep apnea on CPAP.  History of lung nodules followed on serial CT chest Medical history significant for rheumatoid arthritis not on maintenance medication, diabetes, congestive heart failure, coronary artery disease, chronic kidney disease, atrial fibrillation with previous use of amiodarone-stopped in August 2019.  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  Followed by hematology for ITP on chronic steroids with maintenance dose of prednisone 5 mg [facility] retired [employer] manager and gets medications through the [place] system  TEST/EVENTS : PSG 04/2014 showed severe OSA, AHI 48/h with nadir desatn 78% correctd by CPAP 12 cm, 3 L O2 blended in , c flex +2 cm, humidity, A medium full face mask was used. 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 PFT 10/2015 FEV1 was 36%, ratio 46, FVC 58%, DLCO 26%    CT chest 12/2016 -masslike consolidation in LUL   PET scan 03/20/17 >+metabolic act in LUL consolidation    CT chest 08/2017 >> 2.4 x 3.5 cm triangular subpleural opacity in the anterior left upper lobe > has resolved the platelike scarring bilateral nodular infiltrates stable,   CT chest  09/2020 severe emphysema, extensive pleural calcification.  Previous nodule in the right lower lobe has significantly decreased in size measuring 0.8 x 0.8 cm previously at 2.3 cm.  A stable left upper lobe cavitary nodule measuring 4 mm.  And a stable irregular nodule in the left lower lobe measuring 1.5 cm, and a left lower lobe nodule measuring 1.5 cm   CT chest October 22, 2021 that showed a stable uncomplicated aneurysmal dilatation along the aortic arch.  Interval increase of the abdominal aorta from 33 mm to 36 mm.,  Advanced emphysema.  Stable chronic extensive pleural calcifications, no worrisome pulmonary nodules.  Ill-defined left lower lobe pulmonary nodules unchanged since October 2020. Extensive cholelithiasis   CT chest 09/2022 Grossly stable 4.2 cm focal aneurysm seen involving aortic arch. Stable calcified pleural plaques and associated scarring are noted in right lung.Minimally displaced fractures are seen involving the left fourth, fifth, sixth and seventh ribs.    06/29/2023 Follow up : COPD, O2 RF , OSA, lung nodularity The patient is an 81 year old male with a history of very severe COPD, chronic hypoxic respiratory failure on oxygen, obstructive sleep apnea on CPAP, and lung nodularity followed on serial CT. Over the past four months, the patient reports no major changes in his health status.  The patient remains on Stiolto, which he receives through the [place] system, and uses an albuterol inhaler as needed. He reports not using the albuterol nebulizer.  Gets winded at baseline with activities.  Denies any increased cough or congestion.  Does feel short of breath at times when he lies down at night.  Overall feels breathing is doing about the same.  He remains on oxygen 2 L at rest and 3 L with moving around.  Uses a POC device when he is away from home.  Remains fully independent.  Lives alone.  Does all of his housework and chores and shopping.  Patient has underlying sleep apnea  is on nocturnal CPAP.  Says he wears a CPAP every night feels he benefits from CPAP.  CPAP download shows excellent compliance with 100% usage.  Daily average usage at 9 hours.  Patient is on auto CPAP 10 to 15 cm H2O.  Daily average pressure 11.5 cm H2O.  AHI 0.8/hour.  Patient does follow with oncology for ITP.  Is on chronic steroids.  Recently had a drop in his platelets and prednisone has been increased to 20 mg daily.  He has follow-up with oncology later this week.    No Known Allergies  Immunization History  Administered Date(s) Administered   Fluad Quad(high Dose 65+) 04/26/2019, 06/08/2020, 05/01/2021   Influenza Split 04/14/2011, 04/27/2013, 03/28/2014   Influenza Whole 05/28/2009, 04/27/2010, 03/31/2012   Influenza, High Dose Seasonal PF 04/10/2010, 04/11/2011, 04/19/2013, 03/21/2015, 04/05/2016, 05/16/2017, 03/28/2018, 04/08/2018   Influenza,inj,Quad PF,6+ Mos 03/17/2014, 04/04/2016   Influenza-Unspecified 04/30/2006, 04/28/2007, 04/28/2008, 05/29/2009, 04/10/2010, 05/26/2011, 05/07/2012, 05/28/2020, 05/01/2021, 04/27/2022   PFIZER Comirnaty(Gray Top)Covid-19 Tri-Sucrose Vaccine 11/29/2020, 05/01/2021   PFIZER(Purple Top)SARS-COV-2 Vaccination 08/19/2019, 09/09/2019, 03/15/2020, 04/08/2020, 12/09/2020   PNEUMOCOCCAL CONJUGATE-20 06/27/2022   Pfizer Covid-19 Vaccine Bivalent Booster 63yrs & up 05/01/2021   Pfizer(Comirnaty)Fall Seasonal Vaccine 12 years and older 05/03/2022   Pneumococcal Conjugate-13 09/20/2013, 03/29/2015   Pneumococcal Polysaccharide-23 05/28/2008, 04/25/2010, 04/19/2013   Pneumococcal-Unspecified 06/26/2006, 04/27/2013   Rsv, Bivalent, Protein Subunit Rsvpref,pf Verdis Frederickson) 07/30/2022   Td 11/23/2009   Tdap 07/17/2011, 07/30/2022   Zoster, Live 04/28/2009, 11/08/2009    Past Medical History:  Diagnosis Date   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   ablated   Anxiety    Aortic aneurysm (HCC) 09/02/2017   BENIGN PROSTATIC HYPERTROPHY 11/23/2009    Cardiomyopathy (HCC) 08/28/2016   Chronic systolic CHF (congestive heart failure) (HCC) 09/02/2017   COPD (chronic obstructive pulmonary disease) (HCC)    CORONARY ARTERY DISEASE 11/23/2009   DECREASED HEARING, 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   DEGENERATIVE JOINT DISEASE 11/23/2009   DEPRESSION 11/23/2009   FATIGUE 11/23/2009   GAIT DISTURBANCE 12/10/2009   HEMOPTYSIS UNSPECIFIED 05/07/2010   High cholesterol    HYPERTENSION 07/30/2009   HYPOTHYROIDISM 07/30/2009   Ischemic cardiomyopathy 09/02/2017   LUMBAR RADICULOPATHY, 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   On home oxygen therapy    "2-3L; 24/7" (09/10/2016)   OSA on CPAP    Pneumonia    PTSD (post-traumatic stress disorder) 03/10/2012   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   RA (rheumatoid arthritis) (HCC) 06/11/2011   "qwhere" (09/10/2016)   RESPIRATORY FAILURE, CHRONIC 07/31/2009   Scleritis of both eyes 03/17/2014   Thrombocytopenia (HCC)    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   Type II diabetes mellitus (HCC)     Tobacco History: Social History   Tobacco Use  Smoking Status Former   Current packs/day: 0.00   Average packs/day: 2.5 packs/day for 40.0 years (100.0 ttl pk-yrs)   Types: Cigarettes, Pipe, Cigars   Start date: 07/28/1958   Quit date: 07/28/1998   Years since quitting: 24.9   Passive exposure: Never  Smokeless Tobacco Never   Counseling given: Not Answered   Outpatient Medications Prior to Visit  Medication Sig Dispense Refill   acetaminophen (TYLENOL) 500 MG tablet Take 1,000 mg by mouth every 6 (six)  hours as needed for mild pain.     albuterol (PROVENTIL) (2.5 MG/3ML) 0.083% nebulizer solution Take 3 mLs (2.5 mg total) by nebulization every 6 (six) hours as needed for wheezing or shortness of breath. 120 mL 12   albuterol (VENTOLIN HFA) 108 (90 Base) MCG/ACT inhaler INHALE 2 PUFFS INTO THE LUNGS EVERY 6 HOURS AS NEEDED FOR WHEEZING OR SHORTNESS OF BREATH 54 g 3   atorvastatin (LIPITOR) 40 MG tablet Take 1 tablet (40 mg total) by mouth daily. 90 tablet 3    Carboxymethylcellul-Glycerin (LUBRICATING EYE DROPS OP) Place 1 drop into both eyes 4 (four) times daily as needed (dry eyes).      carvedilol (COREG) 3.125 MG tablet Take 1 tablet (3.125 mg total) by mouth 2 (two) times daily with a meal. 60 tablet 3   Cholecalciferol (VITAMIN D3) 2000 units capsule Take 2,000 Units by mouth daily.      clopidogrel (PLAVIX) 75 MG tablet TAKE 1 TABLET(75 MG) BY MOUTH DAILY 30 tablet 11   Continuous Blood Gluc Receiver (DEXCOM G7 RECEIVER) DEVI Use to check blood sugars 3x a day. DX CODE: E11.69, E78.5 1 each 0   Continuous Blood Gluc Sensor (DEXCOM G7 SENSOR) MISC Use to check blood sugars 3x a day. DX CODE: E11.69, E78.5 3 each 4   Continuous Blood Gluc Transmit (DEXCOM G6 TRANSMITTER) MISC by Does not apply route.     cyanocobalamin (VITAMIN B12) 500 MCG tablet TAKE ONE TABLET BY MOUTH DAILY FOR LOW B12 LEVEL     empagliflozin (JARDIANCE) 10 MG TABS tablet Take 5 mg by mouth daily. Filled at [place].     ferrous sulfate 324 (65 Fe) MG TBEC Take 1 tablet by mouth daily.     fluticasone (FLONASE) 50 MCG/ACT nasal spray Place 1 spray into both nostrils daily. (Patient taking differently: Place 1 spray into both nostrils daily as needed for allergies.) 18.2 mL 2   imiquimod (ALDARA) 5 % cream APPLY THIN LAYER TO AFFECTED AREA THREE TIMES A WEEK APPLY TO WARTS 3-5 TIMES PER WEEK FOR 6 WEEKS OR UNTIL WARTS DISAPPEAR     insulin glargine (LANTUS) 100 UNIT/ML injection Inject 30 Units into the skin at bedtime.     leflunomide (ARAVA) 10 MG tablet TAKE ONE TABLET BY MOUTH DAILY FOR RHEUMATOID ARTHRITIS     levothyroxine (SYNTHROID) 125 MCG tablet TAKE ONE TABLET BY MOUTH DAILY FOR THYROID     losartan (COZAAR) 100 MG tablet Take 1 tablet (100 mg total) by mouth daily. 90 tablet 3   metFORMIN (GLUCOPHAGE) 500 MG tablet Take 500-1,000 mg by mouth 2 (two) times daily with a meal. Take 1 tablet (500 mg) by mouth in the morning & take 2 tablets (1000 mg) by mouth in the evening.      Multiple Vitamins-Minerals (MEGA MULTIVITAMIN FOR MEN PO) Take 1 tablet by mouth daily.     OXYGEN Inhale 2 L into the lungs continuous.     predniSONE (DELTASONE) 10 MG tablet Take 2 tablets (20 mg total) by mouth daily with breakfast. 120 tablet 2   pregabalin (LYRICA) 75 MG capsule Take 1 capsule (75 mg total) by mouth 2 (two) times daily.     Semaglutide (OZEMPIC, 1 MG/DOSE, [place]) Inject 1 mg into the skin every Friday.     sertraline (ZOLOFT) 100 MG tablet TAKE ONE-HALF TABLET BY MOUTH EVERY MORNING AFTER BREAKFAST FOR MENTAL HEALTH     tamsulosin (FLOMAX) 0.4 MG CAPS capsule Take 1 capsule (0.4 mg total) by  mouth at bedtime. 30 capsule 0   Tiotropium Bromide-Olodaterol (STIOLTO RESPIMAT) 2.5-2.5 MCG/ACT AERS Inhale 2 puffs into the lungs daily. 4 g 0   traZODone (DESYREL) 50 MG tablet TAKE ONE TABLET BY MOUTH AT BEDTIME FOR INSOMNIA     No facility-administered medications prior to visit.     Review of Systems:   Constitutional:   No  weight loss, night sweats,  Fevers, chills, +fatigue, or  lassitude.  HEENT:   No headaches,  Difficulty swallowing,  Tooth/dental problems, or  Sore throat,                No sneezing, itching, ear ache, nasal congestion, post nasal drip,   CV:  No chest pain,  Orthopnea, PND, swelling in lower extremities, anasarca, dizziness, palpitations, syncope.   GI  No heartburn, indigestion, abdominal pain, nausea, vomiting, diarrhea, change in bowel habits, loss of appetite, bloody stools.   Resp:  no chest wall deformity  Skin: no rash or lesions.  GU: no dysuria, change in color of urine, no urgency or frequency.  No flank pain, no hematuria   MS:  No joint pain or swelling.  No decreased range of motion.  No back pain.    Physical Exam  BP (!) 170/72 (BP Location: Left Arm, Patient Position: Sitting, Cuff Size: Small)   Pulse 60   Temp (!) 97.5 F (36.4 C) (Oral)   Ht 6\' 2"  (1.88 m)   Wt 197 lb (89.4 kg)   SpO2 98%   BMI 25.29 kg/m   GEN:  A/Ox3; pleasant , NAD, well nourished elderly, on oxygen, cane   HEENT:  [place]/AT,  EACs-clear, TMs-wnl, NOSE-clear, THROAT-clear, no lesions, no postnasal drip or exudate noted.   NECK:  Supple w/ fair ROM; no JVD; normal carotid impulses w/o bruits; no thyromegaly or nodules palpated; no lymphadenopathy.    RESP deep creased breath sounds bilaterally  no accessory muscle use, no dullness to percussion  CARD:  RRR, no m/r/g, trace peripheral edema, pulses intact, no cyanosis or clubbing.  GI:   Soft & nt; nml bowel sounds; no organomegaly or masses detected.   Musco: Warm bil, no deformities or joint swelling noted.   Neuro: alert, no focal deficits noted.    Skin: Warm, no lesions or rashes    Lab Results:  CBC   ProBNP    Component Value Date/Time   PROBNP 188 10/20/2016 1038   PROBNP 59.9 05/04/2011 0517     Administration History     None          Latest Ref Rng & Units 11/16/2015    9:55 AM 12/30/2013    9:45 AM  PFT Results  FVC-Pre L 2.96  3.01   FVC-Predicted Pre % 59  59   FVC-Post L 2.89  3.01   FVC-Predicted Post % 58  59   Pre FEV1/FVC % % 43  45   Post FEV1/FCV % % 46  49   FEV1-Pre L 1.28  1.36   FEV1-Predicted Pre % 35  36   FEV1-Post L 1.33  1.48   DLCO uncorrected ml/min/mmHg 9.88  10.13   DLCO UNC% % 26  26   DLCO corrected ml/min/mmHg 9.94    DLCO COR %Predicted % 26    DLVA Predicted % 40  39   TLC L  6.31   TLC % Predicted %  80   RV % Predicted %  80     No results found for: "NITRICOXIDE"  Assessment & Plan:  Assessment and Plan    Chronic Obstructive Pulmonary Disease (COPD)   He has very severe COPD with chronic hypoxic respiratory failure, currently on oxygen therapy, Stiolto two puffs daily, and albuterol inhaler as needed. His symptoms have remained stable without significant changes, and he continues to be active and independent. . We will continue Stiolto two puffs daily and the albuterol inhaler as needed,  encouraging continued activity and independence.  Obstructive Sleep Apnea   His obstructive sleep apnea is managed with CPAP, Has perceived benefit.  Has excellent control and compliance on CPAP   Lung Nodules   Lung nodules are followed on serial CT scans, with the last scan showing scarring without significant changes. The next CT scan is due in March per cardiology.   Congestive Heart Failure (CHF)   Appears euvolemic on exam.  No evidence of acute volume overload.  Continue follow-up with cardiology  Hypertension   Elevated blood pressure was noted  Discussed follow-up with primary care.  (ITP)   Continue follow-up with oncology  General Health Maintenance   He is up to date on flu, COVID, pneumonia, tetanus, and RSV vaccinations, and his weight is stable. We will continue the current vaccination schedule and maintain the current weight.  Follow-up   We will schedule a follow-up appointment in four months.        Rubye Oaks, NP 06/29/2023

## 2023-06-30 ENCOUNTER — Inpatient Hospital Stay: Payer: Medicare PPO | Admitting: Hematology and Oncology

## 2023-06-30 ENCOUNTER — Inpatient Hospital Stay: Payer: Medicare PPO | Attending: Hematology and Oncology

## 2023-06-30 VITALS — BP 149/79 | HR 62 | Temp 97.8°F | Resp 18 | Ht 74.0 in | Wt 196.8 lb

## 2023-06-30 DIAGNOSIS — Z79899 Other long term (current) drug therapy: Secondary | ICD-10-CM | POA: Diagnosis not present

## 2023-06-30 DIAGNOSIS — D693 Immune thrombocytopenic purpura: Secondary | ICD-10-CM

## 2023-06-30 DIAGNOSIS — Z7952 Long term (current) use of systemic steroids: Secondary | ICD-10-CM | POA: Insufficient documentation

## 2023-06-30 DIAGNOSIS — Z7969 Long term (current) use of other immunomodulators and immunosuppressants: Secondary | ICD-10-CM | POA: Diagnosis not present

## 2023-06-30 DIAGNOSIS — Z7902 Long term (current) use of antithrombotics/antiplatelets: Secondary | ICD-10-CM | POA: Diagnosis not present

## 2023-06-30 DIAGNOSIS — Z7984 Long term (current) use of oral hypoglycemic drugs: Secondary | ICD-10-CM | POA: Diagnosis not present

## 2023-06-30 LAB — CBC WITH DIFFERENTIAL (CANCER CENTER ONLY)
Abs Immature Granulocytes: 0.03 10*3/uL (ref 0.00–0.07)
Basophils Absolute: 0 10*3/uL (ref 0.0–0.1)
Basophils Relative: 0 %
Eosinophils Absolute: 0 10*3/uL (ref 0.0–0.5)
Eosinophils Relative: 0 %
HCT: 41.9 % (ref 39.0–52.0)
Hemoglobin: 13.9 g/dL (ref 13.0–17.0)
Immature Granulocytes: 0 %
Lymphocytes Relative: 9 %
Lymphs Abs: 0.8 10*3/uL (ref 0.7–4.0)
MCH: 28.8 pg (ref 26.0–34.0)
MCHC: 33.2 g/dL (ref 30.0–36.0)
MCV: 86.7 fL (ref 80.0–100.0)
Monocytes Absolute: 0.3 10*3/uL (ref 0.1–1.0)
Monocytes Relative: 4 %
Neutro Abs: 7.2 10*3/uL (ref 1.7–7.7)
Neutrophils Relative %: 87 %
Platelet Count: 62 10*3/uL — ABNORMAL LOW (ref 150–400)
RBC: 4.83 MIL/uL (ref 4.22–5.81)
RDW: 15.7 % — ABNORMAL HIGH (ref 11.5–15.5)
WBC Count: 8.4 10*3/uL (ref 4.0–10.5)
nRBC: 0 % (ref 0.0–0.2)

## 2023-07-01 NOTE — Progress Notes (Signed)
Patient Care Team: Myrlene Broker, MD as PCP - General (Internal Medicine) Regan Lemming, MD as PCP - Electrophysiology (Clinical Cardiac Electrophysiology) Lewayne Bunting, MD as PCP - Cardiology (Cardiology) Ernesto Rutherford, MD as Consulting Physician (Ophthalmology) Kalman Shan, MD as Consulting Physician (Pulmonary Disease) System, Provider Not In as Consulting Physician Casimer Lanius, MD as Consulting Physician (Rheumatology) Oretha Milch, MD as Consulting Physician (Pulmonary Disease) Lewayne Bunting, MD as Consulting Physician (Cardiology)  DIAGNOSIS:  Encounter Diagnosis  Name Primary?   Acute ITP (HCC) Yes    CHIEF COMPLIANT: Follow-up of acute ITP  HISTORY OF PRESENT ILLNESS: Mr. Hokama is a 82 year old with above-mentioned history of ITP who is currently on 20 mg of prednisone and appears to be tolerating it reasonably well.  His only issue with the steroid therapy is his blood sugars which are being managed actively by his primary care physician.  He has not noticed any excessive bruising or bleeding lately.  He does bruise easily on the hands but that is not any different than before.     ALLERGIES:  has No Known Allergies.  MEDICATIONS:  Current Outpatient Medications  Medication Sig Dispense Refill   acetaminophen (TYLENOL) 500 MG tablet Take 1,000 mg by mouth every 6 (six) hours as needed for mild pain.     albuterol (PROVENTIL) (2.5 MG/3ML) 0.083% nebulizer solution Take 3 mLs (2.5 mg total) by nebulization every 6 (six) hours as needed for wheezing or shortness of breath. 120 mL 12   albuterol (VENTOLIN HFA) 108 (90 Base) MCG/ACT inhaler INHALE 2 PUFFS INTO THE LUNGS EVERY 6 HOURS AS NEEDED FOR WHEEZING OR SHORTNESS OF BREATH 54 g 3   atorvastatin (LIPITOR) 40 MG tablet Take 1 tablet (40 mg total) by mouth daily. 90 tablet 3   Carboxymethylcellul-Glycerin (LUBRICATING EYE DROPS OP) Place 1 drop into both eyes 4 (four) times daily as  needed (dry eyes).      carvedilol (COREG) 3.125 MG tablet Take 1 tablet (3.125 mg total) by mouth 2 (two) times daily with a meal. 60 tablet 3   Cholecalciferol (VITAMIN D3) 2000 units capsule Take 2,000 Units by mouth daily.      clopidogrel (PLAVIX) 75 MG tablet TAKE 1 TABLET(75 MG) BY MOUTH DAILY 30 tablet 11   Continuous Blood Gluc Receiver (DEXCOM G7 RECEIVER) DEVI Use to check blood sugars 3x a day. DX CODE: E11.69, E78.5 1 each 0   Continuous Blood Gluc Sensor (DEXCOM G7 SENSOR) MISC Use to check blood sugars 3x a day. DX CODE: E11.69, E78.5 3 each 4   Continuous Blood Gluc Transmit (DEXCOM G6 TRANSMITTER) MISC by Does not apply route.     cyanocobalamin (VITAMIN B12) 500 MCG tablet TAKE ONE TABLET BY MOUTH DAILY FOR LOW B12 LEVEL     empagliflozin (JARDIANCE) 10 MG TABS tablet Take 5 mg by mouth daily. Filled at [place].     ferrous sulfate 324 (65 Fe) MG TBEC Take 1 tablet by mouth daily.     fluticasone (FLONASE) 50 MCG/ACT nasal spray Place 1 spray into both nostrils daily. (Patient taking differently: Place 1 spray into both nostrils daily as needed for allergies.) 18.2 mL 2   imiquimod (ALDARA) 5 % cream APPLY THIN LAYER TO AFFECTED AREA THREE TIMES A WEEK APPLY TO WARTS 3-5 TIMES PER WEEK FOR 6 WEEKS OR UNTIL WARTS DISAPPEAR     insulin glargine (LANTUS) 100 UNIT/ML injection Inject 30 Units into the skin at bedtime.  leflunomide (ARAVA) 10 MG tablet TAKE ONE TABLET BY MOUTH DAILY FOR RHEUMATOID ARTHRITIS     levothyroxine (SYNTHROID) 125 MCG tablet TAKE ONE TABLET BY MOUTH DAILY FOR THYROID     losartan (COZAAR) 100 MG tablet Take 1 tablet (100 mg total) by mouth daily. 90 tablet 3   metFORMIN (GLUCOPHAGE) 500 MG tablet Take 500-1,000 mg by mouth 2 (two) times daily with a meal. Take 1 tablet (500 mg) by mouth in the morning & take 2 tablets (1000 mg) by mouth in the evening.     Multiple Vitamins-Minerals (MEGA MULTIVITAMIN FOR MEN PO) Take 1 tablet by mouth daily.     OXYGEN Inhale  2 L into the lungs continuous.     predniSONE (DELTASONE) 10 MG tablet Take 2 tablets (20 mg total) by mouth daily with breakfast. 120 tablet 2   pregabalin (LYRICA) 75 MG capsule Take 1 capsule (75 mg total) by mouth 2 (two) times daily.     Semaglutide (OZEMPIC, 1 MG/DOSE, [place]) Inject 1 mg into the skin every Friday.     sertraline (ZOLOFT) 100 MG tablet TAKE ONE-HALF TABLET BY MOUTH EVERY MORNING AFTER BREAKFAST FOR MENTAL HEALTH     tamsulosin (FLOMAX) 0.4 MG CAPS capsule Take 1 capsule (0.4 mg total) by mouth at bedtime. 30 capsule 0   Tiotropium Bromide-Olodaterol (STIOLTO RESPIMAT) 2.5-2.5 MCG/ACT AERS Inhale 2 puffs into the lungs daily. 4 g 0   traZODone (DESYREL) 50 MG tablet TAKE ONE TABLET BY MOUTH AT BEDTIME FOR INSOMNIA     No current facility-administered medications for this visit.    PHYSICAL EXAMINATION: ECOG PERFORMANCE STATUS: 1 - Symptomatic but completely ambulatory  Vitals:   06/30/23 1509  BP: (!) 149/79  Pulse: 62  Resp: 18  Temp: 97.8 F (36.6 C)  SpO2: 96%   Filed Weights   06/30/23 1509  Weight: 196 lb 12.8 oz (89.3 kg)      LABORATORY DATA:  I have reviewed the data as listed    Latest Ref Rng & Units 01/02/2023    8:37 AM 03/13/2022    7:36 AM 10/24/2021    8:58 AM  CMP  Glucose 70 - 99 mg/dL 829  89  83   BUN 8 - 27 mg/dL 27  30  18    Creatinine 0.76 - 1.27 mg/dL 5.62  1.30  8.65   Sodium 134 - 144 mmol/L 140  142  144   Potassium 3.5 - 5.2 mmol/L 4.7  4.4  3.9   Chloride 96 - 106 mmol/L 104  104  107   CO2 20 - 29 mmol/L 23     Calcium 8.6 - 10.2 mg/dL 9.0     Total Protein 6.0 - 8.5 g/dL 6.2     Total Bilirubin 0.0 - 1.2 mg/dL 0.5     Alkaline Phos 44 - 121 IU/L 83     AST 0 - 40 IU/L 18     ALT 0 - 44 IU/L 14       Lab Results  Component Value Date   WBC 8.4 06/30/2023   HGB 13.9 06/30/2023   HCT 41.9 06/30/2023   MCV 86.7 06/30/2023   PLT 62 (L) 06/30/2023   NEUTROABS 7.2 06/30/2023    ASSESSMENT & PLAN:  Acute ITP  (HCC) Relapsed acute ITP Prior treatment: Prednisone started 07/05/2019 completed 10/23/2019 Lab review:  02/08/2020: Platelet count 37 with blood with expectoration started on prednisone 02/15/2020: Platelet count 98 03/19/2020: Platelet count: 68 04/16/2020: Platelet count  64 (prednisone dose lowered to 5 mg) 06/25/2020: Platelets 38 07/09/2020: Platelets 73 (continue with 5 mg prednisone) 08/26/2020: Platelets 73 3/22: Platelets 67 (prednisone discontinued) 11/01/2020: Platelets 37 (prednisone 5 mg restarted) 11/29/2020: Platelets 68 10/15/2022: Platelets 87 stable 12/25/2022: Platelets 96 04/28/2023: Platelets 48 (prednisone dose increased to 10 mg) 06/04/2023: Platelets 36 (prednisone inc to 20 mg) 06/29/2023: Platelets 62   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    Continue prednisone 20 mg for 2 more weeks and then recheck his labs. Once we have stable platelet counts and we will start to talk about tapering it down.  Elevated blood sugars: Being actively managed by his PCP. Return to clinic in 2 weeks with labs and follow-up.   Orders Placed This Encounter  Procedures   CBC with Differential (Cancer Center Only)    Standing Status:   Future    Standing Expiration Date:   06/29/2024   The patient has a good understanding of the overall plan. he agrees with it. he will call with any problems that may develop before the next visit here. Total time spent: 30 mins including face to face time and time spent for planning, charting and co-ordination of care   Tamsen Meek, MD 07/01/23

## 2023-07-18 ENCOUNTER — Other Ambulatory Visit: Payer: Self-pay | Admitting: Internal Medicine

## 2023-07-20 NOTE — Progress Notes (Signed)
Remote pacemaker transmission.   

## 2023-07-20 NOTE — Addendum Note (Signed)
Addended by: Elease Etienne A on: 07/20/2023 11:11 AM   Modules accepted: Orders

## 2023-08-04 ENCOUNTER — Inpatient Hospital Stay: Payer: Medicare PPO | Attending: Hematology and Oncology

## 2023-08-04 ENCOUNTER — Inpatient Hospital Stay: Payer: Medicare PPO | Admitting: Hematology and Oncology

## 2023-08-04 VITALS — BP 151/77 | HR 60 | Temp 98.0°F | Resp 19 | Ht 74.0 in | Wt 198.3 lb

## 2023-08-04 DIAGNOSIS — Z7984 Long term (current) use of oral hypoglycemic drugs: Secondary | ICD-10-CM | POA: Insufficient documentation

## 2023-08-04 DIAGNOSIS — Z7902 Long term (current) use of antithrombotics/antiplatelets: Secondary | ICD-10-CM | POA: Insufficient documentation

## 2023-08-04 DIAGNOSIS — Z7985 Long-term (current) use of injectable non-insulin antidiabetic drugs: Secondary | ICD-10-CM | POA: Diagnosis not present

## 2023-08-04 DIAGNOSIS — D693 Immune thrombocytopenic purpura: Secondary | ICD-10-CM | POA: Insufficient documentation

## 2023-08-04 DIAGNOSIS — Z794 Long term (current) use of insulin: Secondary | ICD-10-CM | POA: Diagnosis not present

## 2023-08-04 DIAGNOSIS — E1140 Type 2 diabetes mellitus with diabetic neuropathy, unspecified: Secondary | ICD-10-CM | POA: Insufficient documentation

## 2023-08-04 DIAGNOSIS — Z79890 Hormone replacement therapy (postmenopausal): Secondary | ICD-10-CM | POA: Diagnosis not present

## 2023-08-04 DIAGNOSIS — Z7952 Long term (current) use of systemic steroids: Secondary | ICD-10-CM | POA: Insufficient documentation

## 2023-08-04 DIAGNOSIS — Z79899 Other long term (current) drug therapy: Secondary | ICD-10-CM | POA: Diagnosis not present

## 2023-08-04 LAB — CBC WITH DIFFERENTIAL (CANCER CENTER ONLY)
Abs Immature Granulocytes: 0.04 10*3/uL (ref 0.00–0.07)
Basophils Absolute: 0 10*3/uL (ref 0.0–0.1)
Basophils Relative: 0 %
Eosinophils Absolute: 0.1 10*3/uL (ref 0.0–0.5)
Eosinophils Relative: 1 %
HCT: 42 % (ref 39.0–52.0)
Hemoglobin: 13.8 g/dL (ref 13.0–17.0)
Immature Granulocytes: 1 %
Lymphocytes Relative: 10 %
Lymphs Abs: 0.9 10*3/uL (ref 0.7–4.0)
MCH: 28.5 pg (ref 26.0–34.0)
MCHC: 32.9 g/dL (ref 30.0–36.0)
MCV: 86.6 fL (ref 80.0–100.0)
Monocytes Absolute: 0.4 10*3/uL (ref 0.1–1.0)
Monocytes Relative: 4 %
Neutro Abs: 7 10*3/uL (ref 1.7–7.7)
Neutrophils Relative %: 84 %
Platelet Count: 78 10*3/uL — ABNORMAL LOW (ref 150–400)
RBC: 4.85 MIL/uL (ref 4.22–5.81)
RDW: 15.9 % — ABNORMAL HIGH (ref 11.5–15.5)
WBC Count: 8.4 10*3/uL (ref 4.0–10.5)
nRBC: 0 % (ref 0.0–0.2)

## 2023-08-04 MED ORDER — PREDNISONE 20 MG PO TABS: 20.0000 mg | ORAL_TABLET | Freq: Every day | ORAL | 3 refills | Status: DC

## 2023-08-04 NOTE — Progress Notes (Signed)
 Patient Care Team: Rollene Almarie LABOR, MD as PCP - General (Internal Medicine) Inocencio Soyla Lunger, MD as PCP - Electrophysiology (Clinical Cardiac Electrophysiology) Pietro Redell RAMAN, MD as PCP - Cardiology (Cardiology) Octavia Charleston, MD as Consulting Physician (Ophthalmology) Geronimo Amel, MD as Consulting Physician (Pulmonary Disease) System, Provider Not In as Consulting Physician Curt Lighter, MD as Consulting Physician (Rheumatology) Jude Harden GAILS, MD as Consulting Physician (Pulmonary Disease) Pietro Redell RAMAN, MD as Consulting Physician (Cardiology)  DIAGNOSIS:  Encounter Diagnosis  Name Primary?   Acute ITP (HCC) Yes    SUMMARY OF ONCOLOGIC HISTORY: Oncology History   No history exists.    CHIEF COMPLIANT:   HISTORY OF PRESENT ILLNESS: Discussed the use of AI scribe software for clinical note transcription with the patient, who gave verbal consent to proceed.  History of Present Illness   The patient, with a history of Immune Thrombocytopenic Purpura (ITP) and diabetes, presents for a routine follow-up. He reports that his platelet count has improved significantly from 36,000 to 78,000 since his last visit in November. He is currently on 20mg  of prednisone  for his ITP, which he acknowledges has been a challenge due to its impact on his blood sugar levels. His most recent HbA1c was 10, up from 7.9 prior to starting prednisone . He is in communication with his diabetes care provider who understands the necessity of the prednisone  and is comfortable with an HbA1c of up to 8 given the patient's age. 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         ALLERGIES:  has no known allergies.  MEDICATIONS:  Current Outpatient Medications  Medication Sig Dispense Refill   acetaminophen  (TYLENOL ) 500 MG tablet Take 1,000 mg by mouth every 6 (six) hours as needed for mild pain.     albuterol  (PROVENTIL ) (2.5  MG/3ML) 0.083% nebulizer solution Take 3 mLs (2.5 mg total) by nebulization every 6 (six) hours as needed for wheezing or shortness of breath. 120 mL 12   albuterol  (VENTOLIN  HFA) 108 (90 Base) MCG/ACT inhaler INHALE 2 PUFFS INTO THE LUNGS EVERY 6 HOURS AS NEEDED FOR WHEEZING OR SHORTNESS OF BREATH 54 g 3   atorvastatin  (LIPITOR ) 40 MG tablet Take 1 tablet (40 mg total) by mouth daily. 90 tablet 3   Carboxymethylcellul-Glycerin (LUBRICATING EYE DROPS OP) Place 1 drop into both eyes 4 (four) times daily as needed (dry eyes).      carvedilol  (COREG ) 3.125 MG tablet TAKE 1 TABLET(3.125 MG) BY MOUTH TWICE DAILY WITH A MEAL 60 tablet 3   Cholecalciferol  (VITAMIN D3) 2000 units capsule Take 2,000 Units by mouth daily.      clopidogrel  (PLAVIX ) 75 MG tablet TAKE 1 TABLET(75 MG) BY MOUTH DAILY 30 tablet 11   Continuous Blood Gluc Receiver (DEXCOM G7 RECEIVER) DEVI Use to check blood sugars 3x a day. DX CODE: E11.69, E78.5 1 each 0   Continuous Blood Gluc Sensor (DEXCOM G7 SENSOR) MISC Use to check blood sugars 3x a day. DX CODE: E11.69, E78.5 3 each 4   Continuous Blood Gluc Transmit (DEXCOM G6 TRANSMITTER) MISC by Does not apply route.     cyanocobalamin  (VITAMIN B12) 500 MCG tablet TAKE ONE TABLET BY MOUTH DAILY FOR LOW B12 LEVEL     empagliflozin  (JARDIANCE ) 10 MG TABS tablet Take 5 mg by mouth daily. Filled at [facility].     ferrous sulfate  324 (65 Fe) MG TBEC Take 1 tablet by mouth daily.  fluticasone  (FLONASE ) 50 MCG/ACT nasal spray Place 1 spray into both nostrils daily. (Patient taking differently: Place 1 spray into both nostrils daily as needed for allergies.) 18.2 mL 2   imiquimod (ALDARA) 5 % cream APPLY THIN LAYER TO AFFECTED AREA THREE TIMES A WEEK APPLY TO WARTS 3-5 TIMES PER WEEK FOR 6 WEEKS OR UNTIL WARTS DISAPPEAR     insulin  glargine (LANTUS ) 100 UNIT/ML injection Inject 30 Units into the skin at bedtime.     leflunomide  (ARAVA ) 10 MG tablet TAKE ONE TABLET BY MOUTH DAILY FOR RHEUMATOID  ARTHRITIS     levothyroxine  (SYNTHROID ) 125 MCG tablet TAKE ONE TABLET BY MOUTH DAILY FOR THYROID      losartan  (COZAAR ) 100 MG tablet Take 1 tablet (100 mg total) by mouth daily. 90 tablet 3   metFORMIN  (GLUCOPHAGE ) 500 MG tablet Take 500-1,000 mg by mouth 2 (two) times daily with a meal. Take 1 tablet (500 mg) by mouth in the morning & take 2 tablets (1000 mg) by mouth in the evening.     Multiple Vitamins-Minerals (MEGA MULTIVITAMIN FOR MEN PO) Take 1 tablet by mouth daily.     OXYGEN  Inhale 2 L into the lungs continuous.     predniSONE  (DELTASONE ) 20 MG tablet Take 1 tablet (20 mg total) by mouth daily with breakfast. 90 tablet 3   pregabalin  (LYRICA ) 75 MG capsule Take 1 capsule (75 mg total) by mouth 2 (two) times daily.     Semaglutide  (OZEMPIC , 1 MG/DOSE, [place]) Inject 1 mg into the skin every Friday.     sertraline  (ZOLOFT ) 100 MG tablet TAKE ONE-HALF TABLET BY MOUTH EVERY MORNING AFTER BREAKFAST FOR MENTAL HEALTH     tamsulosin  (FLOMAX ) 0.4 MG CAPS capsule Take 1 capsule (0.4 mg total) by mouth at bedtime. 30 capsule 0   Tiotropium Bromide -Olodaterol (STIOLTO RESPIMAT ) 2.5-2.5 MCG/ACT AERS Inhale 2 puffs into the lungs daily. 4 g 0   traZODone  (DESYREL ) 50 MG tablet TAKE ONE TABLET BY MOUTH AT BEDTIME FOR INSOMNIA     No current facility-administered medications for this visit.    PHYSICAL EXAMINATION: ECOG PERFORMANCE STATUS: 1 - Symptomatic but completely ambulatory  Vitals:   08/04/23 1133  BP: (!) 151/77  Pulse: 60  Resp: 19  Temp: 98 F (36.7 C)  SpO2: 98%   Filed Weights   08/04/23 1133  Weight: 198 lb 4.8 oz (89.9 kg)    LABORATORY DATA:  I have reviewed the data as listed    Latest Ref Rng & Units 01/02/2023    8:37 AM 03/13/2022    7:36 AM 10/24/2021    8:58 AM  CMP  Glucose 70 - 99 mg/dL 879  89  83   BUN 8 - 27 mg/dL 27  30  18    Creatinine 0.76 - 1.27 mg/dL 8.80  8.79  8.79   Sodium 134 - 144 mmol/L 140  142  144   Potassium 3.5 - 5.2 mmol/L 4.7  4.4  3.9    Chloride 96 - 106 mmol/L 104  104  107   CO2 20 - 29 mmol/L 23     Calcium  8.6 - 10.2 mg/dL 9.0     Total Protein 6.0 - 8.5 g/dL 6.2     Total Bilirubin 0.0 - 1.2 mg/dL 0.5     Alkaline Phos 44 - 121 IU/L 83     AST 0 - 40 IU/L 18     ALT 0 - 44 IU/L 14       Lab  Results  Component Value Date   WBC 8.4 08/04/2023   HGB 13.8 08/04/2023   HCT 42.0 08/04/2023   MCV 86.6 08/04/2023   PLT 78 (L) 08/04/2023   NEUTROABS 7.0 08/04/2023    ASSESSMENT & PLAN:  Acute ITP (HCC) Relapsed acute ITP Prior treatment: Prednisone  started 07/05/2019 completed 10/23/2019 Lab review:  02/08/2020: Platelet count 37 with blood with expectoration started on prednisone  02/15/2020: Platelet count 98 03/19/2020: Platelet count: 68 04/16/2020: Platelet count 64 (prednisone  dose lowered to 5 mg) 06/25/2020: Platelets 38 07/09/2020: Platelets 73 (continue with 5 mg prednisone ) 08/26/2020: Platelets 73 3/22: Platelets 67 (prednisone  discontinued) 11/01/2020: Platelets 37 (prednisone  5 mg restarted) 11/29/2020: Platelets 68 10/15/2022: Platelets 87 stable 12/25/2022: Platelets 96 04/28/2023: Platelets 48 (prednisone  dose increased to 10 mg) 06/04/2023: Platelets 36 (prednisone  inc to 20 mg) 06/29/2023: Platelets 62 08/04/2023: Platelets 78   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    Continue prednisone  20 mg for 2 more weeks and then recheck his labs. Once we have stable platelet counts and we will start to talk about tapering it down.   Elevated blood sugars: Being actively managed by his PCP. Return to clinic in 2 months with labs and follow-up.    Orders Placed This Encounter  Procedures   CBC with Differential (Cancer Center Only)    Standing Status:   Future    Expiration Date:   08/03/2024   The patient has a good understanding of the overall plan. he agrees with it. he will call with any problems that may  develop before the next visit here. Total time spent: 30 mins including face to face time and time spent for planning, charting and co-ordination of care   Viinay K Belladonna Lubinski, MD 08/04/23

## 2023-08-04 NOTE — Assessment & Plan Note (Signed)
 Relapsed acute ITP Prior treatment: Prednisone  started 07/05/2019 completed 10/23/2019 Lab review:  02/08/2020: Platelet count 37 with blood with expectoration started on prednisone  02/15/2020: Platelet count 98 03/19/2020: Platelet count: 68 04/16/2020: Platelet count 64 (prednisone  dose lowered to 5 mg) 06/25/2020: Platelets 38 07/09/2020: Platelets 73 (continue with 5 mg prednisone ) 08/26/2020: Platelets 73 3/22: Platelets 67 (prednisone  discontinued) 11/01/2020: Platelets 37 (prednisone  5 mg restarted) 11/29/2020: Platelets 68 10/15/2022: Platelets 87 stable 12/25/2022: Platelets 96 04/28/2023: Platelets 48 (prednisone  dose increased to 10 mg) 06/04/2023: Platelets 36 (prednisone  inc to 20 mg) 06/29/2023: Platelets 62 08/04/2023: Platelets    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    Continue prednisone  20 mg for 2 more weeks and then recheck his labs. Once we have stable platelet counts and we will start to talk about tapering it down.   Elevated blood sugars: Being actively managed by his PCP. Return to clinic in 2 weeks with labs and follow-up.

## 2023-09-08 ENCOUNTER — Other Ambulatory Visit: Payer: Self-pay | Admitting: Vascular Surgery

## 2023-09-08 DIAGNOSIS — G4733 Obstructive sleep apnea (adult) (pediatric): Secondary | ICD-10-CM | POA: Diagnosis not present

## 2023-09-25 ENCOUNTER — Ambulatory Visit (INDEPENDENT_AMBULATORY_CARE_PROVIDER_SITE_OTHER): Payer: Medicare PPO

## 2023-09-25 DIAGNOSIS — I442 Atrioventricular block, complete: Secondary | ICD-10-CM

## 2023-09-28 LAB — CUP PACEART REMOTE DEVICE CHECK
Battery Remaining Longevity: 102 mo
Battery Voltage: 2.99 V
Brady Statistic AP VP Percent: 64.67 %
Brady Statistic AP VS Percent: 0.01 %
Brady Statistic AS VP Percent: 33.32 %
Brady Statistic AS VS Percent: 1.99 %
Brady Statistic RA Percent Paced: 65.85 %
Brady Statistic RV Percent Paced: 98 %
Date Time Interrogation Session: 20250228032017
Implantable Lead Connection Status: 753985
Implantable Lead Connection Status: 753985
Implantable Lead Implant Date: 20201204
Implantable Lead Implant Date: 20201204
Implantable Lead Location: 753859
Implantable Lead Location: 753860
Implantable Lead Model: 5076
Implantable Lead Model: 5076
Implantable Pulse Generator Implant Date: 20201204
Lead Channel Impedance Value: 323 Ohm
Lead Channel Impedance Value: 323 Ohm
Lead Channel Impedance Value: 380 Ohm
Lead Channel Impedance Value: 399 Ohm
Lead Channel Pacing Threshold Amplitude: 0.5 V
Lead Channel Pacing Threshold Amplitude: 0.5 V
Lead Channel Pacing Threshold Pulse Width: 0.4 ms
Lead Channel Pacing Threshold Pulse Width: 0.4 ms
Lead Channel Sensing Intrinsic Amplitude: 2.125 mV
Lead Channel Sensing Intrinsic Amplitude: 2.125 mV
Lead Channel Sensing Intrinsic Amplitude: 23.75 mV
Lead Channel Sensing Intrinsic Amplitude: 23.75 mV
Lead Channel Setting Pacing Amplitude: 1.5 V
Lead Channel Setting Pacing Amplitude: 2.5 V
Lead Channel Setting Pacing Pulse Width: 0.4 ms
Lead Channel Setting Sensing Sensitivity: 2 mV
Zone Setting Status: 755011
Zone Setting Status: 755011

## 2023-10-06 ENCOUNTER — Inpatient Hospital Stay: Payer: Medicare PPO | Attending: Hematology and Oncology | Admitting: Hematology and Oncology

## 2023-10-06 ENCOUNTER — Inpatient Hospital Stay: Payer: Medicare PPO

## 2023-10-06 VITALS — BP 160/71 | HR 70 | Temp 98.5°F | Resp 20 | Ht 74.0 in | Wt 193.9 lb

## 2023-10-06 DIAGNOSIS — D693 Immune thrombocytopenic purpura: Secondary | ICD-10-CM

## 2023-10-06 DIAGNOSIS — Z7969 Long term (current) use of other immunomodulators and immunosuppressants: Secondary | ICD-10-CM | POA: Insufficient documentation

## 2023-10-06 DIAGNOSIS — Z7984 Long term (current) use of oral hypoglycemic drugs: Secondary | ICD-10-CM | POA: Insufficient documentation

## 2023-10-06 DIAGNOSIS — Z7902 Long term (current) use of antithrombotics/antiplatelets: Secondary | ICD-10-CM | POA: Insufficient documentation

## 2023-10-06 DIAGNOSIS — Z7952 Long term (current) use of systemic steroids: Secondary | ICD-10-CM | POA: Insufficient documentation

## 2023-10-06 DIAGNOSIS — Z79890 Hormone replacement therapy (postmenopausal): Secondary | ICD-10-CM | POA: Insufficient documentation

## 2023-10-06 DIAGNOSIS — Z79899 Other long term (current) drug therapy: Secondary | ICD-10-CM | POA: Diagnosis not present

## 2023-10-06 LAB — CBC WITH DIFFERENTIAL (CANCER CENTER ONLY)
Abs Immature Granulocytes: 0.1 10*3/uL — ABNORMAL HIGH (ref 0.00–0.07)
Basophils Absolute: 0 10*3/uL (ref 0.0–0.1)
Basophils Relative: 0 %
Eosinophils Absolute: 0 10*3/uL (ref 0.0–0.5)
Eosinophils Relative: 0 %
HCT: 40.7 % (ref 39.0–52.0)
Hemoglobin: 13.1 g/dL (ref 13.0–17.0)
Immature Granulocytes: 1 %
Lymphocytes Relative: 5 %
Lymphs Abs: 0.5 10*3/uL — ABNORMAL LOW (ref 0.7–4.0)
MCH: 28.7 pg (ref 26.0–34.0)
MCHC: 32.2 g/dL (ref 30.0–36.0)
MCV: 89.3 fL (ref 80.0–100.0)
Monocytes Absolute: 0.2 10*3/uL (ref 0.1–1.0)
Monocytes Relative: 3 %
Neutro Abs: 8.4 10*3/uL — ABNORMAL HIGH (ref 1.7–7.7)
Neutrophils Relative %: 91 %
Platelet Count: 137 10*3/uL — ABNORMAL LOW (ref 150–400)
RBC: 4.56 MIL/uL (ref 4.22–5.81)
RDW: 14.5 % (ref 11.5–15.5)
WBC Count: 9.3 10*3/uL (ref 4.0–10.5)
nRBC: 0 % (ref 0.0–0.2)

## 2023-10-06 NOTE — Progress Notes (Signed)
 Patient Care Team: Myrlene Broker, MD as PCP - General (Internal Medicine) Regan Lemming, MD as PCP - Electrophysiology (Clinical Cardiac Electrophysiology) Lewayne Bunting, MD as PCP - Cardiology (Cardiology) Ernesto Rutherford, MD as Consulting Physician (Ophthalmology) Kalman Shan, MD as Consulting Physician (Pulmonary Disease) System, Provider Not In as Consulting Physician Casimer Lanius, MD as Consulting Physician (Rheumatology) Oretha Milch, MD as Consulting Physician (Pulmonary Disease) Lewayne Bunting, MD as Consulting Physician (Cardiology)  DIAGNOSIS:  Encounter Diagnosis  Name Primary?   Acute ITP (HCC) Yes     CHIEF COMPLIANT: Follow-up of acute ITP  HISTORY OF PRESENT ILLNESS:   History of Present Illness The patient, with a history of leg and foot swelling, presents with worsening symptoms since the end of January. The swelling is bilateral and associated with redness on the top part of the foot. The patient has had two ultrasounds, with no detection of blood clots. The patient reports that the prednisone medication seems to be effective, with the dosage currently at 20mg . The patient has not reported any other symptoms or changes in health status.     ALLERGIES:  has no known allergies.  MEDICATIONS:  Current Outpatient Medications  Medication Sig Dispense Refill   acetaminophen (TYLENOL) 500 MG tablet Take 1,000 mg by mouth every 6 (six) hours as needed for mild pain.     albuterol (PROVENTIL) (2.5 MG/3ML) 0.083% nebulizer solution Take 3 mLs (2.5 mg total) by nebulization every 6 (six) hours as needed for wheezing or shortness of breath. 120 mL 12   albuterol (VENTOLIN HFA) 108 (90 Base) MCG/ACT inhaler INHALE 2 PUFFS INTO THE LUNGS EVERY 6 HOURS AS NEEDED FOR WHEEZING OR SHORTNESS OF BREATH 54 g 3   atorvastatin (LIPITOR) 40 MG tablet Take 1 tablet (40 mg total) by mouth daily. 90 tablet 3   Carboxymethylcellul-Glycerin (LUBRICATING EYE  DROPS OP) Place 1 drop into both eyes 4 (four) times daily as needed (dry eyes).      carvedilol (COREG) 3.125 MG tablet TAKE 1 TABLET(3.125 MG) BY MOUTH TWICE DAILY WITH A MEAL 60 tablet 3   Cholecalciferol (VITAMIN D3) 2000 units capsule Take 2,000 Units by mouth daily.      clopidogrel (PLAVIX) 75 MG tablet TAKE 1 TABLET(75 MG) BY MOUTH DAILY 30 tablet 11   Continuous Blood Gluc Receiver (DEXCOM G7 RECEIVER) DEVI Use to check blood sugars 3x a day. DX CODE: E11.69, E78.5 1 each 0   Continuous Blood Gluc Sensor (DEXCOM G7 SENSOR) MISC Use to check blood sugars 3x a day. DX CODE: E11.69, E78.5 3 each 4   Continuous Blood Gluc Transmit (DEXCOM G6 TRANSMITTER) MISC by Does not apply route.     cyanocobalamin (VITAMIN B12) 500 MCG tablet TAKE ONE TABLET BY MOUTH DAILY FOR LOW B12 LEVEL     empagliflozin (JARDIANCE) 10 MG TABS tablet Take 5 mg by mouth daily. Filled at [place].     ferrous sulfate 324 (65 Fe) MG TBEC Take 1 tablet by mouth daily.     fluticasone (FLONASE) 50 MCG/ACT nasal spray Place 1 spray into both nostrils daily. (Patient taking differently: Place 1 spray into both nostrils daily as needed for allergies.) 18.2 mL 2   imiquimod (ALDARA) 5 % cream APPLY THIN LAYER TO AFFECTED AREA THREE TIMES A WEEK APPLY TO WARTS 3-5 TIMES PER WEEK FOR 6 WEEKS OR UNTIL WARTS DISAPPEAR     insulin glargine (LANTUS) 100 UNIT/ML injection Inject 30 Units into the skin at bedtime.  leflunomide (ARAVA) 10 MG tablet TAKE ONE TABLET BY MOUTH DAILY FOR RHEUMATOID ARTHRITIS     levothyroxine (SYNTHROID) 125 MCG tablet TAKE ONE TABLET BY MOUTH DAILY FOR THYROID     losartan (COZAAR) 100 MG tablet Take 1 tablet (100 mg total) by mouth daily. 90 tablet 3   metFORMIN (GLUCOPHAGE) 500 MG tablet Take 500-1,000 mg by mouth 2 (two) times daily with a meal. Take 1 tablet (500 mg) by mouth in the morning & take 2 tablets (1000 mg) by mouth in the evening.     Multiple Vitamins-Minerals (MEGA MULTIVITAMIN FOR MEN PO)  Take 1 tablet by mouth daily.     OXYGEN Inhale 2 L into the lungs continuous.     predniSONE (DELTASONE) 20 MG tablet Take 1 tablet (20 mg total) by mouth daily with breakfast. 90 tablet 3   pregabalin (LYRICA) 75 MG capsule Take 1 capsule (75 mg total) by mouth 2 (two) times daily.     Semaglutide (OZEMPIC, 1 MG/DOSE, [place]) Inject 1 mg into the skin every Friday.     sertraline (ZOLOFT) 100 MG tablet TAKE ONE-HALF TABLET BY MOUTH EVERY MORNING AFTER BREAKFAST FOR MENTAL HEALTH     tamsulosin (FLOMAX) 0.4 MG CAPS capsule Take 1 capsule (0.4 mg total) by mouth at bedtime. 30 capsule 0   Tiotropium Bromide-Olodaterol (STIOLTO RESPIMAT) 2.5-2.5 MCG/ACT AERS Inhale 2 puffs into the lungs daily. 4 g 0   traZODone (DESYREL) 50 MG tablet TAKE ONE TABLET BY MOUTH AT BEDTIME FOR INSOMNIA     No current facility-administered medications for this visit.    PHYSICAL EXAMINATION: ECOG PERFORMANCE STATUS: 1 - Symptomatic but completely ambulatory  Vitals:   10/06/23 1511  BP: (!) 160/71  Pulse: 70  Resp: 20  Temp: 98.5 F (36.9 C)  SpO2: 96%   Filed Weights   10/06/23 1511  Weight: 193 lb 14.4 oz (88 kg)    Physical Exam Continued need for 24-hour oxygen.  (exam performed in the presence of a chaperone)  LABORATORY DATA:  I have reviewed the data as listed    Latest Ref Rng & Units 01/02/2023    8:37 AM 03/13/2022    7:36 AM 10/24/2021    8:58 AM  CMP  Glucose 70 - 99 mg/dL 657  89  83   BUN 8 - 27 mg/dL 27  30  18    Creatinine 0.76 - 1.27 mg/dL 8.46  9.62  9.52   Sodium 134 - 144 mmol/L 140  142  144   Potassium 3.5 - 5.2 mmol/L 4.7  4.4  3.9   Chloride 96 - 106 mmol/L 104  104  107   CO2 20 - 29 mmol/L 23     Calcium 8.6 - 10.2 mg/dL 9.0     Total Protein 6.0 - 8.5 g/dL 6.2     Total Bilirubin 0.0 - 1.2 mg/dL 0.5     Alkaline Phos 44 - 121 IU/L 83     AST 0 - 40 IU/L 18     ALT 0 - 44 IU/L 14       Lab Results  Component Value Date   WBC 9.3 10/06/2023   HGB 13.1 10/06/2023    HCT 40.7 10/06/2023   MCV 89.3 10/06/2023   PLT 137 (L) 10/06/2023   NEUTROABS 8.4 (H) 10/06/2023    ASSESSMENT & PLAN:  Acute ITP (HCC) Relapsed acute ITP Prior treatment: Prednisone started 07/05/2019 completed 10/23/2019 Lab review:  02/08/2020: Platelet count 37 with blood  with expectoration started on prednisone 02/15/2020: Platelet count 98 03/19/2020: Platelet count: 68 04/16/2020: Platelet count 64 (prednisone dose lowered to 5 mg) 3/22: Platelets 67 (prednisone discontinued) 11/01/2020: Platelets 37 (prednisone 5 mg restarted) 12/25/2022: Platelets 96 04/28/2023: Platelets 48 (prednisone dose increased to 10 mg) 06/04/2023: Platelets 36 (prednisone inc to 20 mg) 06/29/2023: Platelets 62 08/04/2023: Platelets 78 10/06/2023: Platelets 137   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    Taper prednisone dosage to 15 mg daily starting 10/06/2023.[name]  Elevated blood sugars: Being actively managed by his PCP. Return to clinic in 2 months with labs and follow-up and we will further reduce prednisone to 10 mg daily at that time.  He will likely stay on the 10 mg dose of prednisone..      No orders of the defined types were placed in this encounter.  The patient has a good understanding of the overall plan. he agrees with it. he will call with any problems that may develop before the next visit here. Total time spent: 30 mins including face to face time and time spent for planning, charting and co-ordination of care   Tamsen Meek, MD 10/06/23

## 2023-10-06 NOTE — Assessment & Plan Note (Signed)
 Relapsed acute ITP Prior treatment: Prednisone started 07/05/2019 completed 10/23/2019 Lab review:  02/08/2020: Platelet count 37 with blood with expectoration started on prednisone 02/15/2020: Platelet count 98 03/19/2020: Platelet count: 68 04/16/2020: Platelet count 64 (prednisone dose lowered to 5 mg) 3/22: Platelets 67 (prednisone discontinued) 11/01/2020: Platelets 37 (prednisone 5 mg restarted) 12/25/2022: Platelets 96 04/28/2023: Platelets 48 (prednisone dose increased to 10 mg) 06/04/2023: Platelets 36 (prednisone inc to 20 mg) 06/29/2023: Platelets 62 08/04/2023: Platelets 78 10/06/2023: Platelets   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    Taper prednisone dosage.  Elevated blood sugars: Being actively managed by his PCP. Return to clinic in 2 months with labs and follow-up.

## 2023-10-13 ENCOUNTER — Encounter: Payer: Self-pay | Admitting: Internal Medicine

## 2023-10-26 NOTE — Addendum Note (Signed)
 Addended by: Elease Etienne A on: 10/26/2023 09:20 AM   Modules accepted: Orders

## 2023-10-26 NOTE — Progress Notes (Signed)
 Remote pacemaker transmission.

## 2023-10-29 ENCOUNTER — Ambulatory Visit: Payer: Medicare PPO | Admitting: Adult Health

## 2023-10-29 ENCOUNTER — Encounter: Payer: Self-pay | Admitting: Adult Health

## 2023-10-29 VITALS — BP 152/68 | HR 71 | Temp 98.1°F | Ht 74.0 in | Wt 199.6 lb

## 2023-10-29 DIAGNOSIS — G4733 Obstructive sleep apnea (adult) (pediatric): Secondary | ICD-10-CM | POA: Diagnosis not present

## 2023-10-29 DIAGNOSIS — J9611 Chronic respiratory failure with hypoxia: Secondary | ICD-10-CM

## 2023-10-29 DIAGNOSIS — I5022 Chronic systolic (congestive) heart failure: Secondary | ICD-10-CM

## 2023-10-29 DIAGNOSIS — R918 Other nonspecific abnormal finding of lung field: Secondary | ICD-10-CM | POA: Diagnosis not present

## 2023-10-29 DIAGNOSIS — R5381 Other malaise: Secondary | ICD-10-CM | POA: Diagnosis not present

## 2023-10-29 DIAGNOSIS — J449 Chronic obstructive pulmonary disease, unspecified: Secondary | ICD-10-CM | POA: Diagnosis not present

## 2023-10-29 DIAGNOSIS — I7121 Aneurysm of the ascending aorta, without rupture: Secondary | ICD-10-CM | POA: Diagnosis not present

## 2023-10-29 NOTE — Patient Instructions (Addendum)
 Continue on Stiolto 2 puffs daily .  Albuterol inhaler /nebulizer As needed   Mucinex DM Twice daily  As needed  Cough/congestion .  Continue on Oxygen 2l/m rest and 3l/m with activity .  Allegra daily As needed   Flonase daily As needed   Continue on CPAP At bedtime wear with Naps.  Activity as tolerated.  Saline nasal rinses Twice daily   Saline nasal gel At bedtime .  CT chest as planned Follow up with cardiology.  Follow up in 4 Months  with Dr. Vassie Loll or Marckus Hanover NP and As needed   Please contact office for sooner follow up if symptoms do not improve or worsen or seek emergency care

## 2023-10-29 NOTE — Progress Notes (Signed)
 @Patient  ID: [name], male    DOB: 1941/05/06, 83 y.o.   MRN: [id number]  Chief Complaint  Patient presents with   Follow-up    Referring provider: Myrlene Broker, *  HPI: 83 yo male former smoker followed for very severe COPD, chronic hypoxic respiratory failure on oxygen, sleep apnea on CPAP. History of lung nodules followed on Serial CT chest imaging.  Medical history significant for rheumatoid arthritis, diabetes, congestive heart failure, coronary artery disease, chronic kidney disease, atrial fibrillation with previous use of amiodarone-stopped in August 2019.  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 PFT 10/2015 FEV1 was 36%, ratio 46, FVC 58%, DLCO 26%    CT chest 12/2016 -masslike consolidation in LUL   PET scan 03/20/17 >+metabolic act in LUL consolidation    CT chest 08/2017 >> 2.4 x 3.5 cm triangular subpleural opacity in the anterior left upper lobe > has resolved the platelike scarring bilateral nodular infiltrates stable,   CT chest 09/2020 severe emphysema, extensive pleural calcification.  Previous nodule in the right lower lobe has significantly decreased in size measuring 0.8 x 0.8 cm previously at 2.3 cm.  A stable left  upper lobe cavitary nodule measuring 4 mm.  And a stable irregular nodule in the left lower lobe measuring 1.5 cm, and a left lower lobe nodule measuring 1.5 cm   CT chest October 22, 2021 that showed a stable uncomplicated aneurysmal dilatation along the aortic arch.  Interval increase of the abdominal aorta from 33 mm to 36 mm.,  Advanced emphysema.  Stable chronic extensive pleural calcifications, no worrisome pulmonary nodules.  Ill-defined left lower lobe pulmonary nodules unchanged since October 2020. Extensive cholelithiasis   CT chest 09/2022 Grossly stable 4.2 cm focal aneurysm seen involving aortic arch. Stable calcified pleural plaques and associated scarring are noted in right lung.Minimally displaced fractures are seen involving the left fourth, fifth, sixth and seventh ribs.  10/29/2023 Follow up ; COPD , O2 RF , OSA , Lung nodules  Discussed the use of AI scribe software for clinical note transcription with the patient, who gave verbal consent to proceed.  History of Present Illness   [name] is an 83 year old male   He experiences variable breathing, with good and bad days, but has not needed to use his rescue inhaler. He continues to use Stiolto, two puffs once a day, and uses oxygen at home at two liters at rest, increasing to three liters if needed. He has a portable oxygen device and recently had a broken part replaced by the supplier.  Is requesting we order CT chest aorta to follow annual surveillance for aortic aneurysm and lung nodularity. Has not been able to get into cardiology.   He experienced  a fall approximately a week and a half ago, on the Sunday before last, and is unsure of the exact cause. He notes a pattern of falling to the left side, which he attributes to weakness in his left leg. He has a history of a bypass graft in this leg, which he believes contributes to the muscle weakness. He uses a walker to aid mobility and has strategically placed three  walkers around his home for support, including one in the bathroom to assist with getting in and out of the shower. He reports a minor aching pain in his shoulder following the fall but no significant injuries.  There was no loss of consciousness.  No known head injury.  He has difficulty controlling his diabetes, with blood sugar levels remaining high, which he attributes to prednisone use. He is on insulin, including a fast-acting type before meals and Lantus at bedtime. He also takes a once-a-week injection, likely Ozempic (semaglutide). His platelet levels have improved  and his prednisone has been reduced from 20 mg to 15 mg.  His weight is stable, fluctuating between 193 and 199 pounds, and is currently at 195 pounds. He remains active, doing housework and yard work.  He mentions a recent trip to the bank, drugstore, and grocery store, indicating continued independence. He wears a compression sock for leg swelling, removing it at night. He drives occasionally, with assistance from daughter for appointments.   He uses a CPAP machine for sleep apnea, averaging eight hours of use per night, and reports good control of his condition. He uses distilled water in the CPAP water chamber.  CPAP download shows excellent compliance with 100% usage.  Daily average usage around 8 hours.  AHI 1.1.     Remains on oxygen 2 L at rest and 3 L with activity.   No Known Allergies  Immunization History  Administered Date(s) Administered   Fluad Quad(high Dose 65+) 04/26/2019, 06/08/2020, 05/01/2021   Fluad Trivalent(High Dose 65+) 04/23/2023   Influenza Split 04/14/2011, 04/27/2013, 03/28/2014   Influenza Whole 05/28/2009, 04/27/2010, 03/31/2012   Influenza, High Dose Seasonal PF 04/10/2010, 04/11/2011, 04/19/2013, 03/21/2015, 04/05/2016, 05/16/2017, 03/28/2018, 04/08/2018   Influenza,inj,Quad PF,6+ Mos 03/17/2014, 04/04/2016   Influenza-Unspecified 04/30/2006, 04/28/2007, 04/28/2008, 05/29/2009, 04/10/2010,  05/26/2011, 05/07/2012, 05/28/2020, 05/01/2021, 04/27/2022   PFIZER Comirnaty(Gray Top)Covid-19 Tri-Sucrose Vaccine 11/29/2020, 05/01/2021   PFIZER(Purple Top)SARS-COV-2 Vaccination 08/19/2019, 09/09/2019, 03/15/2020, 04/08/2020, 12/09/2020   PNEUMOCOCCAL CONJUGATE-20 06/27/2022   Pfizer Covid-19 Vaccine Bivalent Booster 32yrs & up 05/01/2021   Pfizer(Comirnaty)Fall Seasonal Vaccine 12 years and older 05/03/2022, 04/23/2023   Pneumococcal Conjugate-13 09/20/2013, 03/29/2015   Pneumococcal Polysaccharide-23 05/28/2008, 04/25/2010, 04/19/2013   Pneumococcal-Unspecified 06/26/2006, 04/27/2013   Rsv, Bivalent, Protein Subunit Rsvpref,pf Verdis Frederickson) 07/30/2022   Td 11/23/2009   Tdap 07/17/2011, 07/30/2022   Zoster, Live 04/28/2009, 11/08/2009    Past Medical History:  Diagnosis Date   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   ablated   Anxiety    Aortic aneurysm (HCC) 09/02/2017   BENIGN PROSTATIC HYPERTROPHY 11/23/2009   Cardiomyopathy (HCC) 08/28/2016   Chronic systolic CHF (congestive heart failure) (HCC) 09/02/2017   COPD (chronic obstructive pulmonary disease) (HCC)    CORONARY ARTERY DISEASE 11/23/2009   DECREASED HEARING, 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   DEGENERATIVE JOINT DISEASE 11/23/2009   DEPRESSION 11/23/2009   FATIGUE 11/23/2009   GAIT DISTURBANCE 12/10/2009   HEMOPTYSIS UNSPECIFIED 05/07/2010   High cholesterol    HYPERTENSION 07/30/2009   HYPOTHYROIDISM 07/30/2009   Ischemic cardiomyopathy 09/02/2017   LUMBAR RADICULOPATHY, 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   On home  oxygen therapy    "2-3L; 24/7" (09/10/2016)   OSA on CPAP    Pneumonia    PTSD (post-traumatic stress disorder) 03/10/2012   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   RA (rheumatoid arthritis) (HCC) 06/11/2011   "qwhere" (09/10/2016)   RESPIRATORY FAILURE, CHRONIC 07/31/2009   Scleritis of both eyes 03/17/2014   Thrombocytopenia (HCC)    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   Type II diabetes mellitus (HCC)     Tobacco History: Social History   Tobacco Use  Smoking Status Former    Current packs/day: 0.00   Average packs/day: 2.5 packs/day for 40.0 years (100.0 ttl pk-yrs)   Types: Cigarettes, Pipe, Cigars   Start date: 07/28/1958   Quit date: 07/28/1998   Years since quitting: 25.2   Passive exposure: Never  Smokeless Tobacco Never   Counseling given: Not Answered   Outpatient Medications Prior to Visit  Medication Sig Dispense Refill   acetaminophen (TYLENOL) 500 MG tablet Take 1,000 mg by mouth every 6 (six) hours as needed for mild pain.     albuterol (PROVENTIL) (2.5 MG/3ML) 0.083% nebulizer solution Take 3 mLs (2.5 mg total) by nebulization every 6 (six) hours as needed for wheezing or shortness of breath. 120 mL 12   albuterol (VENTOLIN HFA) 108 (90 Base) MCG/ACT inhaler INHALE 2 PUFFS INTO THE LUNGS EVERY 6 HOURS AS NEEDED FOR WHEEZING OR SHORTNESS OF BREATH 54 g 3   atorvastatin (LIPITOR) 40 MG tablet Take 1 tablet (40 mg total) by mouth daily. 90 tablet 3   Carboxymethylcellul-Glycerin (LUBRICATING EYE DROPS OP) Place 1 drop into both eyes 4 (four) times daily as needed (dry eyes).      carvedilol (COREG) 3.125 MG tablet TAKE 1 TABLET(3.125 MG) BY MOUTH TWICE DAILY WITH A MEAL 60 tablet 3   Cholecalciferol (VITAMIN D3) 2000 units capsule Take 2,000 Units by mouth daily.      clopidogrel (PLAVIX) 75 MG tablet TAKE 1 TABLET(75 MG) BY MOUTH DAILY 30 tablet 11   Continuous Blood Gluc Receiver (DEXCOM G7 RECEIVER) DEVI Use to check blood sugars 3x a day. DX CODE: E11.69, E78.5 1 each 0   Continuous Blood Gluc Sensor (DEXCOM G7 SENSOR) MISC Use to check blood sugars 3x a day. DX CODE: E11.69, E78.5 3 each 4   Continuous Blood Gluc Transmit (DEXCOM G6 TRANSMITTER) MISC by Does not apply route.     cyanocobalamin (VITAMIN B12) 500 MCG tablet TAKE ONE TABLET BY MOUTH DAILY FOR LOW B12 LEVEL     empagliflozin (JARDIANCE) 10 MG TABS tablet Take 5 mg by mouth daily. Filled at [place].     ferrous sulfate 324 (65 Fe) MG TBEC Take 1 tablet by mouth daily.     fluticasone  (FLONASE) 50 MCG/ACT nasal spray Place 1 spray into both nostrils daily. (Patient taking differently: Place 1 spray into both nostrils daily as needed for allergies.) 18.2 mL 2   imiquimod (ALDARA) 5 % cream APPLY THIN LAYER TO AFFECTED AREA THREE TIMES A WEEK APPLY TO WARTS 3-5 TIMES PER WEEK FOR 6 WEEKS OR UNTIL WARTS DISAPPEAR     insulin glargine (LANTUS) 100 UNIT/ML injection Inject 30 Units into the skin at bedtime.     leflunomide (ARAVA) 10 MG tablet TAKE ONE TABLET BY MOUTH DAILY FOR RHEUMATOID ARTHRITIS     levothyroxine (SYNTHROID) 125 MCG tablet TAKE ONE TABLET BY MOUTH DAILY FOR THYROID     losartan (COZAAR) 100 MG tablet Take 1 tablet (100 mg total) by mouth daily. 90 tablet 3   metFORMIN (  GLUCOPHAGE) 500 MG tablet Take 500-1,000 mg by mouth 2 (two) times daily with a meal. Take 1 tablet (500 mg) by mouth in the morning & take 2 tablets (1000 mg) by mouth in the evening.     Multiple Vitamins-Minerals (MEGA MULTIVITAMIN FOR MEN PO) Take 1 tablet by mouth daily.     OXYGEN Inhale 2 L into the lungs continuous.     predniSONE (DELTASONE) 20 MG tablet Take 1 tablet (20 mg total) by mouth daily with breakfast. 90 tablet 3   pregabalin (LYRICA) 75 MG capsule Take 1 capsule (75 mg total) by mouth 2 (two) times daily.     Semaglutide (OZEMPIC, 1 MG/DOSE, [place]) Inject 1 mg into the skin every Friday.     sertraline (ZOLOFT) 100 MG tablet TAKE ONE-HALF TABLET BY MOUTH EVERY MORNING AFTER BREAKFAST FOR MENTAL HEALTH     tamsulosin (FLOMAX) 0.4 MG CAPS capsule Take 1 capsule (0.4 mg total) by mouth at bedtime. 30 capsule 0   Tiotropium Bromide-Olodaterol (STIOLTO RESPIMAT) 2.5-2.5 MCG/ACT AERS Inhale 2 puffs into the lungs daily. 4 g 0   traZODone (DESYREL) 50 MG tablet TAKE ONE TABLET BY MOUTH AT BEDTIME FOR INSOMNIA     No facility-administered medications prior to visit.     Review of Systems:   Constitutional:   No  weight loss, night sweats,  Fevers, chills, +fatigue, or   lassitude.  HEENT:   No headaches,  Difficulty swallowing,  Tooth/dental problems, or  Sore throat,                No sneezing, itching, ear ache, nasal congestion, post nasal drip,   CV:  No chest pain,  Orthopnea, PND, swelling in lower extremities, anasarca, dizziness, palpitations, syncope.   GI  No heartburn, indigestion, abdominal pain, nausea, vomiting, diarrhea, change in bowel habits, loss of appetite, bloody stools.   Resp:   No chest wall deformity  Skin: no rash or lesions.  GU: no dysuria, change in color of urine, no urgency or frequency.  No flank pain, no hematuria   MS:  No joint pain or swelling.  No decreased range of motion.  No back pain.    Physical Exam  BP (!) 150/70 (BP Location: Left Arm, Patient Position: Sitting, Cuff Size: Normal)   Pulse 71   Temp 98.1 F (36.7 C) (Oral)   Ht 6\' 2"  (1.88 m)   Wt 199 lb 9.6 oz (90.5 kg)   SpO2 93%   BMI 25.63 kg/m   GEN: A/Ox3; pleasant , NAD, well nourished, elderly, on oxygen   HEENT:  [place]/AT, NOSE-clear, THROAT-clear, no lesions, no postnasal drip or exudate noted.   NECK:  Supple w/ fair ROM; no JVD; normal carotid impulses w/o bruits; no thyromegaly or nodules palpated; no lymphadenopathy.    RESP decreased breath sounds in the bases. no accessory muscle use, no dullness to percussion  CARD:  RRR, Gr1-2 SM , tr  peripheral edema, pulses intact, no cyanosis or clubbing.  GI:   Soft & nt; nml bowel sounds; no organomegaly or masses detected.   Musco: Warm bil, no deformities or joint swelling noted.   Neuro: alert, no focal deficits noted.    Skin: Warm, no lesions or rashes    Lab Results:  CBC    Component Value Date/Time   WBC 9.3 10/06/2023 1442   WBC 8.7 11/14/2020 0945   RBC 4.56 10/06/2023 1442   HGB 13.1 10/06/2023 1442   HGB 12.0 (L) 06/28/2019 1453  HCT 40.7 10/06/2023 1442   HCT 38.4 06/28/2019 1453   PLT 137 (L) 10/06/2023 1442   PLT 44 (LL) 06/28/2019 1453   MCV 89.3  10/06/2023 1442   MCV 89 06/28/2019 1453   MCH 28.7 10/06/2023 1442   MCHC 32.2 10/06/2023 1442   RDW 14.5 10/06/2023 1442   RDW 13.8 06/28/2019 1453   LYMPHSABS 0.5 (L) 10/06/2023 1442   LYMPHSABS 1.2 12/23/2016 1053   MONOABS 0.2 10/06/2023 1442   EOSABS 0.0 10/06/2023 1442   EOSABS 0.3 12/23/2016 1053   BASOSABS 0.0 10/06/2023 1442   BASOSABS 0.0 12/23/2016 1053    BMET    Component Value Date/Time   NA 140 01/02/2023 0837   K 4.7 01/02/2023 0837   CL 104 01/02/2023 0837   CO2 23 01/02/2023 0837   GLUCOSE 120 (H) 01/02/2023 0837   GLUCOSE 89 03/13/2022 0736   BUN 27 01/02/2023 0837   CREATININE 1.19 01/02/2023 0837   CREATININE 1.17 04/13/2020 0915   CALCIUM 9.0 01/02/2023 0837   GFRNONAA >60 08/26/2020 0314   GFRNONAA >60 07/15/2018 1059   GFRAA 58 (L) 02/22/2020 1029   GFRAA >60 07/15/2018 1059    BNP No results found for: "BNP"  ProBNP    Component Value Date/Time   PROBNP 188 10/20/2016 1038   PROBNP 59.9 05/04/2011 0517    Imaging: No results found.  Administration History     None          Latest Ref Rng & Units 11/16/2015    9:55 AM 12/30/2013    9:45 AM  PFT Results  FVC-Pre L 2.96  3.01   FVC-Predicted Pre % 59  59   FVC-Post L 2.89  3.01   FVC-Predicted Post % 58  59   Pre FEV1/FVC % % 43  45   Post FEV1/FCV % % 46  49   FEV1-Pre L 1.28  1.36   FEV1-Predicted Pre % 35  36   FEV1-Post L 1.33  1.48   DLCO uncorrected ml/min/mmHg 9.88  10.13   DLCO UNC% % 26  26   DLCO corrected ml/min/mmHg 9.94    DLCO COR %Predicted % 26    DLVA Predicted % 40  39   TLC L  6.31   TLC % Predicted %  80   RV % Predicted %  80     No results found for: "NITRICOXIDE"      Assessment & Plan:   No problem-specific Assessment & Plan notes found for this encounter.   Assessment and Plan    Severe chronic obstructive pulmonary disease (COPD)   COPD symptoms are well-managed with no recent use of the rescue inhaler. He is on Stiolto, two puffs  once daily, and uses home oxygen at two liters at rest, with the option to increase to three liters if needed. He has a portable oxygen device and is advised to maintain its use. Continue Stiolto and maintain oxygen therapy as prescribed.  Frequent falls in the setting of physical deconditioning. He experiences frequent falls, primarily to the left side, possibly due to left leg weakness related to a previous bypass graft. He uses a walker and has strategically placed them around the house for support. A rollator with a bench seat is recommended for outdoor activities to prevent falls and provide a place to rest when fatigued. Ensure immediate access to a seat when outdoors.  Diabetes mellitus   Diabetes is poorly controlled, with high blood glucose levels, likely exacerbated by  prednisone use. He is on insulin therapy, including fast-acting insulin before meals and long-acting Lantus at bedtime. Prednisone can complicate blood sugar control, even with a perfect diet. Continue insulin therapy as prescribed and monitor blood glucose levels regularly.  Continue follow-up primary care.  Sleep apnea   Sleep apnea is well-controlled with CPAP therapy. He uses the CPAP for an average of eight hours per night, with optimal pressure settings and good control of symptoms.  He has perceived benefit.  Continue CPAP therapy and ensure CPAP equipment is clean, using distilled water in the water chamber.  Aortic aneurysm  -stable on previous surveillance imaging.  Advised to make follow-up with cardiology.  Will set patient up for CT Chest angio aorta for ongoing surveillance.  Lung nodularity- CT chest ordered for ongoing surveillance .   General Health Maintenance   He is up to date on vaccinations, including flu, pneumonia, RSV, tetanus, and shingles. No vaccines are needed until the fall. Review vaccination status in the fall for flu and RSV recommendations.  Goals of Care   He is advised to remain active  to maintain muscle strength and prevent falls. Emphasized the importance of being proactive in preventing falls by having immediate access to a seat when feeling fatigued. Encourage continued physical activity to maintain muscle strength and ensure access to seating when outdoors.  CHF -appears euvolemic on exam.  Continue current settings.  Follow-up with cardiology as planned   Follow-up   He is advised to follow up with Dr. Vassie Loll in August at the [facility] office Schedule follow-up appointment with Dr. Vassie Loll in August and return for a follow-up visit in December.        Rubye Oaks, NP 10/29/2023

## 2023-11-11 ENCOUNTER — Ambulatory Visit (HOSPITAL_COMMUNITY)

## 2023-11-17 ENCOUNTER — Encounter (HOSPITAL_BASED_OUTPATIENT_CLINIC_OR_DEPARTMENT_OTHER): Payer: Self-pay

## 2023-11-17 ENCOUNTER — Ambulatory Visit (HOSPITAL_BASED_OUTPATIENT_CLINIC_OR_DEPARTMENT_OTHER)
Admission: RE | Admit: 2023-11-17 | Discharge: 2023-11-17 | Disposition: A | Discharge status: Home / Self Care | Source: Ambulatory Visit | Attending: Adult Health | Admitting: Adult Health

## 2023-11-17 DIAGNOSIS — I7123 Aneurysm of the descending thoracic aorta, without rupture: Secondary | ICD-10-CM | POA: Diagnosis not present

## 2023-11-17 DIAGNOSIS — I7121 Aneurysm of the ascending aorta, without rupture: Secondary | ICD-10-CM | POA: Insufficient documentation

## 2023-11-17 DIAGNOSIS — I728 Aneurysm of other specified arteries: Secondary | ICD-10-CM | POA: Diagnosis not present

## 2023-11-17 DIAGNOSIS — J929 Pleural plaque without asbestos: Secondary | ICD-10-CM | POA: Diagnosis not present

## 2023-11-17 LAB — POCT I-STAT CREATININE: Creatinine, Ser: 1.7 mg/dL — ABNORMAL HIGH (ref 0.61–1.24)

## 2023-11-17 MED ORDER — IOHEXOL 350 MG/ML SOLN
100.0000 mL | Freq: Once | INTRAVENOUS | Status: AC | PRN
  Administered 2023-11-17: 80 mL via INTRAVENOUS

## 2023-11-24 NOTE — Addendum Note (Signed)
 Addended by: Drema Genta on: 11/24/2023 04:58 PM   Modules accepted: Orders

## 2023-12-01 ENCOUNTER — Inpatient Hospital Stay: Admitting: Hematology and Oncology

## 2023-12-01 ENCOUNTER — Inpatient Hospital Stay: Attending: Hematology and Oncology

## 2023-12-01 VITALS — BP 130/78 | HR 116 | Temp 98.2°F | Resp 20 | Ht 74.0 in | Wt 190.2 lb

## 2023-12-01 DIAGNOSIS — R042 Hemoptysis: Secondary | ICD-10-CM | POA: Diagnosis present

## 2023-12-01 DIAGNOSIS — E871 Hypo-osmolality and hyponatremia: Secondary | ICD-10-CM | POA: Diagnosis present

## 2023-12-01 DIAGNOSIS — N1832 Chronic kidney disease, stage 3b: Secondary | ICD-10-CM | POA: Diagnosis present

## 2023-12-01 DIAGNOSIS — Z4682 Encounter for fitting and adjustment of non-vascular catheter: Secondary | ICD-10-CM | POA: Diagnosis not present

## 2023-12-01 DIAGNOSIS — E1165 Type 2 diabetes mellitus with hyperglycemia: Secondary | ICD-10-CM | POA: Diagnosis present

## 2023-12-01 DIAGNOSIS — J984 Other disorders of lung: Secondary | ICD-10-CM | POA: Diagnosis not present

## 2023-12-01 DIAGNOSIS — G4733 Obstructive sleep apnea (adult) (pediatric): Secondary | ICD-10-CM | POA: Diagnosis not present

## 2023-12-01 DIAGNOSIS — Z7952 Long term (current) use of systemic steroids: Secondary | ICD-10-CM | POA: Insufficient documentation

## 2023-12-01 DIAGNOSIS — Z9484 Stem cells transplant status: Secondary | ICD-10-CM | POA: Diagnosis not present

## 2023-12-01 DIAGNOSIS — E118 Type 2 diabetes mellitus with unspecified complications: Secondary | ICD-10-CM | POA: Diagnosis not present

## 2023-12-01 DIAGNOSIS — I5032 Chronic diastolic (congestive) heart failure: Secondary | ICD-10-CM | POA: Diagnosis present

## 2023-12-01 DIAGNOSIS — Z7902 Long term (current) use of antithrombotics/antiplatelets: Secondary | ICD-10-CM | POA: Insufficient documentation

## 2023-12-01 DIAGNOSIS — J9621 Acute and chronic respiratory failure with hypoxia: Secondary | ICD-10-CM | POA: Diagnosis present

## 2023-12-01 DIAGNOSIS — D693 Immune thrombocytopenic purpura: Secondary | ICD-10-CM | POA: Diagnosis present

## 2023-12-01 DIAGNOSIS — Z950 Presence of cardiac pacemaker: Secondary | ICD-10-CM | POA: Diagnosis not present

## 2023-12-01 DIAGNOSIS — S271XXA Traumatic hemothorax, initial encounter: Secondary | ICD-10-CM | POA: Diagnosis present

## 2023-12-01 DIAGNOSIS — I5022 Chronic systolic (congestive) heart failure: Secondary | ICD-10-CM | POA: Diagnosis not present

## 2023-12-01 DIAGNOSIS — J9601 Acute respiratory failure with hypoxia: Secondary | ICD-10-CM | POA: Diagnosis not present

## 2023-12-01 DIAGNOSIS — Z95828 Presence of other vascular implants and grafts: Secondary | ICD-10-CM | POA: Diagnosis not present

## 2023-12-01 DIAGNOSIS — J9612 Chronic respiratory failure with hypercapnia: Secondary | ICD-10-CM | POA: Diagnosis present

## 2023-12-01 DIAGNOSIS — R0602 Shortness of breath: Secondary | ICD-10-CM | POA: Diagnosis not present

## 2023-12-01 DIAGNOSIS — J9611 Chronic respiratory failure with hypoxia: Secondary | ICD-10-CM | POA: Diagnosis not present

## 2023-12-01 DIAGNOSIS — Z7984 Long term (current) use of oral hypoglycemic drugs: Secondary | ICD-10-CM | POA: Insufficient documentation

## 2023-12-01 DIAGNOSIS — E039 Hypothyroidism, unspecified: Secondary | ICD-10-CM | POA: Diagnosis present

## 2023-12-01 DIAGNOSIS — N179 Acute kidney failure, unspecified: Secondary | ICD-10-CM | POA: Diagnosis not present

## 2023-12-01 DIAGNOSIS — M799 Soft tissue disorder, unspecified: Secondary | ICD-10-CM | POA: Diagnosis not present

## 2023-12-01 DIAGNOSIS — Z79890 Hormone replacement therapy (postmenopausal): Secondary | ICD-10-CM | POA: Insufficient documentation

## 2023-12-01 DIAGNOSIS — W1830XA Fall on same level, unspecified, initial encounter: Secondary | ICD-10-CM | POA: Diagnosis present

## 2023-12-01 DIAGNOSIS — R9431 Abnormal electrocardiogram [ECG] [EKG]: Secondary | ICD-10-CM | POA: Diagnosis not present

## 2023-12-01 DIAGNOSIS — J948 Other specified pleural conditions: Secondary | ICD-10-CM | POA: Diagnosis not present

## 2023-12-01 DIAGNOSIS — J942 Hemothorax: Secondary | ICD-10-CM | POA: Diagnosis not present

## 2023-12-01 DIAGNOSIS — F32A Depression, unspecified: Secondary | ICD-10-CM | POA: Diagnosis present

## 2023-12-01 DIAGNOSIS — R918 Other nonspecific abnormal finding of lung field: Secondary | ICD-10-CM | POA: Diagnosis not present

## 2023-12-01 DIAGNOSIS — J90 Pleural effusion, not elsewhere classified: Secondary | ICD-10-CM | POA: Diagnosis not present

## 2023-12-01 DIAGNOSIS — E440 Moderate protein-calorie malnutrition: Secondary | ICD-10-CM | POA: Diagnosis present

## 2023-12-01 DIAGNOSIS — E1122 Type 2 diabetes mellitus with diabetic chronic kidney disease: Secondary | ICD-10-CM | POA: Diagnosis present

## 2023-12-01 DIAGNOSIS — J432 Centrilobular emphysema: Secondary | ICD-10-CM | POA: Insufficient documentation

## 2023-12-01 DIAGNOSIS — I255 Ischemic cardiomyopathy: Secondary | ICD-10-CM | POA: Diagnosis present

## 2023-12-01 DIAGNOSIS — I480 Paroxysmal atrial fibrillation: Secondary | ICD-10-CM | POA: Diagnosis present

## 2023-12-01 DIAGNOSIS — Z66 Do not resuscitate: Secondary | ICD-10-CM | POA: Diagnosis present

## 2023-12-01 DIAGNOSIS — I700 Atherosclerosis of aorta: Secondary | ICD-10-CM | POA: Diagnosis not present

## 2023-12-01 DIAGNOSIS — D631 Anemia in chronic kidney disease: Secondary | ICD-10-CM | POA: Diagnosis present

## 2023-12-01 DIAGNOSIS — Z7969 Long term (current) use of other immunomodulators and immunosuppressants: Secondary | ICD-10-CM | POA: Insufficient documentation

## 2023-12-01 DIAGNOSIS — J9811 Atelectasis: Secondary | ICD-10-CM | POA: Diagnosis not present

## 2023-12-01 DIAGNOSIS — Z79899 Other long term (current) drug therapy: Secondary | ICD-10-CM | POA: Insufficient documentation

## 2023-12-01 DIAGNOSIS — J441 Chronic obstructive pulmonary disease with (acute) exacerbation: Secondary | ICD-10-CM | POA: Diagnosis present

## 2023-12-01 DIAGNOSIS — J929 Pleural plaque without asbestos: Secondary | ICD-10-CM | POA: Diagnosis not present

## 2023-12-01 DIAGNOSIS — M069 Rheumatoid arthritis, unspecified: Secondary | ICD-10-CM | POA: Diagnosis present

## 2023-12-01 DIAGNOSIS — J449 Chronic obstructive pulmonary disease, unspecified: Secondary | ICD-10-CM | POA: Diagnosis present

## 2023-12-01 DIAGNOSIS — R059 Cough, unspecified: Secondary | ICD-10-CM | POA: Diagnosis not present

## 2023-12-01 DIAGNOSIS — I130 Hypertensive heart and chronic kidney disease with heart failure and stage 1 through stage 4 chronic kidney disease, or unspecified chronic kidney disease: Secondary | ICD-10-CM | POA: Diagnosis present

## 2023-12-01 DIAGNOSIS — J439 Emphysema, unspecified: Secondary | ICD-10-CM | POA: Diagnosis not present

## 2023-12-01 LAB — CBC WITH DIFFERENTIAL (CANCER CENTER ONLY)
Abs Immature Granulocytes: 0.05 10*3/uL (ref 0.00–0.07)
Basophils Absolute: 0 10*3/uL (ref 0.0–0.1)
Basophils Relative: 0 %
Eosinophils Absolute: 0.1 10*3/uL (ref 0.0–0.5)
Eosinophils Relative: 1 %
HCT: 37.8 % — ABNORMAL LOW (ref 39.0–52.0)
Hemoglobin: 12.4 g/dL — ABNORMAL LOW (ref 13.0–17.0)
Immature Granulocytes: 1 %
Lymphocytes Relative: 10 %
Lymphs Abs: 1 10*3/uL (ref 0.7–4.0)
MCH: 28.2 pg (ref 26.0–34.0)
MCHC: 32.8 g/dL (ref 30.0–36.0)
MCV: 86.1 fL (ref 80.0–100.0)
Monocytes Absolute: 0.7 10*3/uL (ref 0.1–1.0)
Monocytes Relative: 7 %
Neutro Abs: 8.9 10*3/uL — ABNORMAL HIGH (ref 1.7–7.7)
Neutrophils Relative %: 81 %
Platelet Count: 150 10*3/uL (ref 150–400)
RBC: 4.39 MIL/uL (ref 4.22–5.81)
RDW: 15 % (ref 11.5–15.5)
WBC Count: 10.9 10*3/uL — ABNORMAL HIGH (ref 4.0–10.5)
nRBC: 0 % (ref 0.0–0.2)

## 2023-12-01 MED ORDER — PREDNISONE 10 MG PO TABS: 10.0000 mg | ORAL_TABLET | Freq: Every day | ORAL | 3 refills | Status: DC

## 2023-12-01 NOTE — Progress Notes (Signed)
 Patient Care Team: Adelia Homestead, MD as PCP - General (Internal Medicine) Lei Pump, MD as PCP - Electrophysiology (Clinical Cardiac Electrophysiology) Lenise Quince, MD as PCP - Cardiology (Cardiology) Maris Sickle, MD as Consulting Physician (Ophthalmology) Maire Scot, MD as Consulting Physician (Pulmonary Disease) System, Provider Not In as Consulting Physician Larrie Po, MD as Consulting Physician (Rheumatology) Lind Repine, MD as Consulting Physician (Pulmonary Disease) Lenise Quince, MD as Consulting Physician (Cardiology)  DIAGNOSIS:  Encounter Diagnosis  Name Primary?   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  Recent blood work shows platelet levels have increased from 137 to 150. He is on a prednisone  regimen, taking a 20 mg pill split in half, along with 5 mg pills.  A CT scan revealed thickening in the lower lobe of his left lung. Five weeks ago, he fell on his left side on asphalt, resulting in pain in the area and hemoptysis. He is unsure if the pain is related to his heart or lungs.  He has emphysema and typically receives a Z-Pak or other antibiotics for hemoptysis, which usually resolves the issue. He does not feel unwell otherwise.     ALLERGIES:  has no known allergies.  MEDICATIONS:  Current Outpatient Medications  Medication Sig Dispense Refill   acetaminophen  (TYLENOL ) 500 MG tablet Take 1,000 mg by mouth every 6 (six) hours as needed for mild pain.     albuterol  (PROVENTIL ) (2.5 MG/3ML) 0.083% nebulizer solution Take 3 mLs (2.5 mg total) by nebulization every 6 (six) hours as needed for wheezing or shortness of breath. 120 mL 12   albuterol  (VENTOLIN  HFA) 108 (90 Base)  MCG/ACT inhaler INHALE 2 PUFFS INTO THE LUNGS EVERY 6 HOURS AS NEEDED FOR WHEEZING OR SHORTNESS OF BREATH 54 g 3   atorvastatin  (LIPITOR ) 40 MG tablet Take 1 tablet (40 mg total) by mouth daily. 90 tablet 3   Carboxymethylcellul-Glycerin (LUBRICATING EYE DROPS OP) Place 1 drop into both eyes 4 (four) times daily as needed (dry eyes).      carvedilol  (COREG ) 3.125 MG tablet TAKE 1 TABLET(3.125 MG) BY MOUTH TWICE DAILY WITH A MEAL 60 tablet 3   Cholecalciferol  (VITAMIN D3) 2000 units capsule Take 2,000 Units by mouth daily.      clopidogrel  (PLAVIX ) 75 MG tablet TAKE 1 TABLET(75 MG) BY MOUTH DAILY 30 tablet 11   Continuous Blood Gluc Receiver (DEXCOM G7 RECEIVER) DEVI Use to check blood sugars 3x a day. DX CODE: E11.69, E78.5 1 each 0   Continuous Blood Gluc Sensor (DEXCOM G7 SENSOR) MISC Use to check blood sugars 3x a day. DX CODE: E11.69, E78.5 3 each 4   Continuous Blood Gluc Transmit (DEXCOM G6 TRANSMITTER) MISC by Does not apply route.     cyanocobalamin  (VITAMIN B12) 500 MCG tablet TAKE ONE TABLET BY MOUTH DAILY FOR LOW B12 LEVEL     empagliflozin (JARDIANCE) 10 MG TABS tablet Take 5 mg by mouth daily. Filled at [facility].     ferrous sulfate  324 (65 Fe) MG TBEC Take 1 tablet by mouth daily.     fluticasone  (FLONASE ) 50 MCG/ACT nasal spray Place 1 spray into both nostrils daily. (Patient taking differently: Place 1 spray into both nostrils daily as needed  for allergies.) 18.2 mL 2   imiquimod (ALDARA) 5 % cream APPLY THIN LAYER TO AFFECTED AREA THREE TIMES A WEEK APPLY TO WARTS 3-5 TIMES PER WEEK FOR 6 WEEKS OR UNTIL WARTS DISAPPEAR     insulin  glargine (LANTUS ) 100 UNIT/ML injection Inject 30 Units into the skin at bedtime.     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 90 tablet 3    metFORMIN  (GLUCOPHAGE ) 500 MG tablet Take 500-1,000 mg by mouth 2 (two) times daily with a meal. Take 1 tablet (500 mg) by mouth in the morning & take 2 tablets (1000 mg) by mouth in the evening.     Multiple Vitamins-Minerals (MEGA MULTIVITAMIN FOR MEN PO) Take 1 tablet by mouth daily.     OXYGEN  Inhale 2 L into the lungs continuous.     predniSONE  (DELTASONE ) 20 MG tablet Take 1 tablet (20 mg total) by mouth daily with breakfast. 90 tablet 3   pregabalin  (LYRICA ) 75 MG capsule Take 1 capsule (75 mg total) by mouth 2 (two) times daily.     Semaglutide  (OZEMPIC , 1 MG/DOSE, [place]) Inject 1 mg into the skin every Friday.     sertraline  (ZOLOFT ) 100 MG tablet TAKE ONE-HALF TABLET BY MOUTH EVERY MORNING AFTER BREAKFAST FOR MENTAL HEALTH     tamsulosin  (FLOMAX ) 0.4 MG CAPS capsule Take 1 capsule (0.4 mg total) by mouth at bedtime. 30 capsule 0   Tiotropium Bromide -Olodaterol (STIOLTO RESPIMAT ) 2.5-2.5 MCG/ACT AERS Inhale 2 puffs into the lungs daily. 4 g 0   traZODone (DESYREL) 50 MG tablet TAKE ONE TABLET BY MOUTH AT BEDTIME FOR INSOMNIA     No current facility-administered medications for this visit.    PHYSICAL EXAMINATION: ECOG PERFORMANCE STATUS: 1 - Symptomatic but completely ambulatory  Vitals:   12/01/23 1431  BP: 130/78  Pulse: (!) 116  Resp: 20  Temp: 98.2 F (36.8 C)  SpO2: (!) 87%   Filed Weights   12/01/23 1431  Weight: 190 lb 3.2 oz (86.3 kg)    Physical Exam CARDIOVASCULAR: Heart murmur present, not significant.  (exam performed in the presence of a chaperone)  LABORATORY DATA:  I have reviewed the data as listed    Latest Ref Rng & Units 11/17/2023    3:38 PM 01/02/2023    8:37 AM 03/13/2022    7:36 AM  CMP  Glucose 70 - 99 mg/dL  409  89   BUN 8 - 27 mg/dL  27  30   Creatinine 8.11 - 1.24 mg/dL 9.14  7.82  9.56   Sodium 134 - 144 mmol/L  140  142   Potassium 3.5 - 5.2 mmol/L  4.7  4.4   Chloride 96 - 106 mmol/L  104  104   CO2 20 - 29 mmol/L  23    Calcium  8.6 -  10.2 mg/dL  9.0    Total Protein 6.0 - 8.5 g/dL  6.2    Total Bilirubin 0.0 - 1.2 mg/dL  0.5    Alkaline Phos 44 - 121 IU/L  83    AST 0 - 40 IU/L  18    ALT 0 - 44 IU/L  14      Lab Results  Component Value Date   WBC 10.9 (H) 12/01/2023  HGB 12.4 (L) 12/01/2023   HCT 37.8 (L) 12/01/2023   MCV 86.1 12/01/2023   PLT 150 12/01/2023   NEUTROABS 8.9 (H) 12/01/2023    ASSESSMENT & PLAN:  Acute ITP (HCC) Relapsed acute ITP Prior treatment: Prednisone  started 07/05/2019 completed 10/23/2019 Lab review:  02/08/2020: Platelet count 37 with blood with expectoration started on prednisone  02/15/2020: Platelet count 98 03/19/2020: Platelet count: 68 04/16/2020: Platelet count 64 (prednisone  dose lowered to 5 mg) 3/22: Platelets 67 (prednisone  discontinued) 11/01/2020: Platelets 37 (prednisone  5 mg restarted) 12/25/2022: Platelets 96 04/28/2023: Platelets 48 (prednisone  dose increased to 10 mg) 06/04/2023: Platelets 36 (prednisone  inc to 20 mg) 06/29/2023: Platelets 62 08/04/2023: Platelets 78 10/06/2023: Platelets 137 12/01/2023: Platelets 150 (prednisone  dose decreased to 10 mg)   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    Dose: 15 mg   Elevated blood sugars: Being actively managed by his PCP. Return to clinic in 2 months with labs and follow-up and we will further reduce prednisone  to 10 mg daily at that time.  He will likely stay on the 10 mg dose of prednisone . ------------------------------------- Assessment and Plan Assessment & Plan Pulmonary issues with hemoptysis Chronic pulmonary issues with recent hemoptysis. CT showed left lower lobe thickening. Differential includes emphysema and scarring. Previously responded to azithromycin . - Contacted Dr. Villa Greaser for expedited follow-up. - Advised use of MyChart for direct communication with pulmonologist. - Awaiting further evaluation and management  from Dr. Villa Greaser.      Orders Placed This Encounter  Procedures   CBC with Differential (Cancer Center Only)    Standing Status:   Future    Expiration Date:   11/30/2024   The patient has a good understanding of the overall plan. he agrees with it. he will call with any problems that may develop before the next visit here. Total time spent: 30 mins including face to face time and time spent for planning, charting and co-ordination of care   Margert Sheerer, MD 12/01/23

## 2023-12-01 NOTE — Assessment & Plan Note (Signed)
 Relapsed acute ITP Prior treatment: Prednisone  started 07/05/2019 completed 10/23/2019 Lab review:  02/08/2020: Platelet count 37 with blood with expectoration started on prednisone  02/15/2020: Platelet count 98 03/19/2020: Platelet count: 68 04/16/2020: Platelet count 64 (prednisone  dose lowered to 5 mg) 3/22: Platelets 67 (prednisone  discontinued) 11/01/2020: Platelets 37 (prednisone  5 mg restarted) 12/25/2022: Platelets 96 04/28/2023: Platelets 48 (prednisone  dose increased to 10 mg) 06/04/2023: Platelets 36 (prednisone  inc to 20 mg) 06/29/2023: Platelets 62 08/04/2023: Platelets 78 10/06/2023: Platelets 137   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    Dose: 15 mg   Elevated blood sugars: Being actively managed by his PCP. Return to clinic in 2 months with labs and follow-up and we will further reduce prednisone  to 10 mg daily at that time.  He will likely stay on the 10 mg dose of prednisone .

## 2023-12-02 ENCOUNTER — Other Ambulatory Visit: Payer: Self-pay | Admitting: *Deleted

## 2023-12-02 DIAGNOSIS — R042 Hemoptysis: Secondary | ICD-10-CM

## 2023-12-02 DIAGNOSIS — J432 Centrilobular emphysema: Secondary | ICD-10-CM

## 2023-12-03 ENCOUNTER — Emergency Department (HOSPITAL_COMMUNITY)

## 2023-12-03 ENCOUNTER — Ambulatory Visit: Admitting: Adult Health

## 2023-12-03 ENCOUNTER — Encounter (HOSPITAL_COMMUNITY): Payer: Self-pay

## 2023-12-03 ENCOUNTER — Encounter: Payer: Self-pay | Admitting: Adult Health

## 2023-12-03 ENCOUNTER — Other Ambulatory Visit: Payer: Self-pay

## 2023-12-03 ENCOUNTER — Ambulatory Visit (INDEPENDENT_AMBULATORY_CARE_PROVIDER_SITE_OTHER)

## 2023-12-03 ENCOUNTER — Inpatient Hospital Stay (HOSPITAL_COMMUNITY)
Admission: EM | Admit: 2023-12-03 | Discharge: 2023-12-09 | DRG: 199 | Disposition: A | Discharge status: Home Health | Source: Ambulatory Visit | Attending: Internal Medicine | Admitting: Internal Medicine

## 2023-12-03 VITALS — BP 92/49 | HR 57 | Temp 98.1°F | Ht 74.0 in | Wt 190.0 lb

## 2023-12-03 DIAGNOSIS — Z950 Presence of cardiac pacemaker: Secondary | ICD-10-CM | POA: Diagnosis not present

## 2023-12-03 DIAGNOSIS — Z794 Long term (current) use of insulin: Secondary | ICD-10-CM

## 2023-12-03 DIAGNOSIS — E1165 Type 2 diabetes mellitus with hyperglycemia: Secondary | ICD-10-CM | POA: Diagnosis present

## 2023-12-03 DIAGNOSIS — E1122 Type 2 diabetes mellitus with diabetic chronic kidney disease: Secondary | ICD-10-CM | POA: Diagnosis present

## 2023-12-03 DIAGNOSIS — F419 Anxiety disorder, unspecified: Secondary | ICD-10-CM | POA: Diagnosis present

## 2023-12-03 DIAGNOSIS — I493 Ventricular premature depolarization: Secondary | ICD-10-CM | POA: Diagnosis present

## 2023-12-03 DIAGNOSIS — E440 Moderate protein-calorie malnutrition: Secondary | ICD-10-CM | POA: Insufficient documentation

## 2023-12-03 DIAGNOSIS — Z9484 Stem cells transplant status: Secondary | ICD-10-CM | POA: Diagnosis not present

## 2023-12-03 DIAGNOSIS — J432 Centrilobular emphysema: Secondary | ICD-10-CM

## 2023-12-03 DIAGNOSIS — I255 Ischemic cardiomyopathy: Secondary | ICD-10-CM | POA: Diagnosis present

## 2023-12-03 DIAGNOSIS — N179 Acute kidney failure, unspecified: Secondary | ICD-10-CM | POA: Diagnosis present

## 2023-12-03 DIAGNOSIS — E871 Hypo-osmolality and hyponatremia: Secondary | ICD-10-CM | POA: Diagnosis present

## 2023-12-03 DIAGNOSIS — R918 Other nonspecific abnormal finding of lung field: Secondary | ICD-10-CM | POA: Diagnosis present

## 2023-12-03 DIAGNOSIS — M069 Rheumatoid arthritis, unspecified: Secondary | ICD-10-CM | POA: Diagnosis present

## 2023-12-03 DIAGNOSIS — Z9981 Dependence on supplemental oxygen: Secondary | ICD-10-CM

## 2023-12-03 DIAGNOSIS — E7800 Pure hypercholesterolemia, unspecified: Secondary | ICD-10-CM | POA: Diagnosis present

## 2023-12-03 DIAGNOSIS — D693 Immune thrombocytopenic purpura: Secondary | ICD-10-CM | POA: Diagnosis present

## 2023-12-03 DIAGNOSIS — R042 Hemoptysis: Principal | ICD-10-CM

## 2023-12-03 DIAGNOSIS — Z66 Do not resuscitate: Secondary | ICD-10-CM | POA: Diagnosis present

## 2023-12-03 DIAGNOSIS — N1832 Chronic kidney disease, stage 3b: Secondary | ICD-10-CM | POA: Diagnosis present

## 2023-12-03 DIAGNOSIS — Z6823 Body mass index (BMI) 23.0-23.9, adult: Secondary | ICD-10-CM

## 2023-12-03 DIAGNOSIS — J9612 Chronic respiratory failure with hypercapnia: Secondary | ICD-10-CM | POA: Diagnosis present

## 2023-12-03 DIAGNOSIS — J8410 Pulmonary fibrosis, unspecified: Secondary | ICD-10-CM | POA: Diagnosis present

## 2023-12-03 DIAGNOSIS — Z7952 Long term (current) use of systemic steroids: Secondary | ICD-10-CM

## 2023-12-03 DIAGNOSIS — J441 Chronic obstructive pulmonary disease with (acute) exacerbation: Secondary | ICD-10-CM | POA: Diagnosis present

## 2023-12-03 DIAGNOSIS — S271XXA Traumatic hemothorax, initial encounter: Secondary | ICD-10-CM | POA: Diagnosis present

## 2023-12-03 DIAGNOSIS — F32A Depression, unspecified: Secondary | ICD-10-CM | POA: Diagnosis present

## 2023-12-03 DIAGNOSIS — I5022 Chronic systolic (congestive) heart failure: Secondary | ICD-10-CM | POA: Diagnosis not present

## 2023-12-03 DIAGNOSIS — E118 Type 2 diabetes mellitus with unspecified complications: Secondary | ICD-10-CM | POA: Diagnosis not present

## 2023-12-03 DIAGNOSIS — J942 Hemothorax: Secondary | ICD-10-CM | POA: Diagnosis not present

## 2023-12-03 DIAGNOSIS — Z87891 Personal history of nicotine dependence: Secondary | ICD-10-CM

## 2023-12-03 DIAGNOSIS — I130 Hypertensive heart and chronic kidney disease with heart failure and stage 1 through stage 4 chronic kidney disease, or unspecified chronic kidney disease: Secondary | ICD-10-CM | POA: Diagnosis present

## 2023-12-03 DIAGNOSIS — N400 Enlarged prostate without lower urinary tract symptoms: Secondary | ICD-10-CM | POA: Diagnosis present

## 2023-12-03 DIAGNOSIS — J069 Acute upper respiratory infection, unspecified: Secondary | ICD-10-CM

## 2023-12-03 DIAGNOSIS — Z7902 Long term (current) use of antithrombotics/antiplatelets: Secondary | ICD-10-CM

## 2023-12-03 DIAGNOSIS — J9611 Chronic respiratory failure with hypoxia: Secondary | ICD-10-CM

## 2023-12-03 DIAGNOSIS — J438 Other emphysema: Secondary | ICD-10-CM | POA: Diagnosis present

## 2023-12-03 DIAGNOSIS — I480 Paroxysmal atrial fibrillation: Secondary | ICD-10-CM | POA: Diagnosis present

## 2023-12-03 DIAGNOSIS — Z9889 Other specified postprocedural states: Secondary | ICD-10-CM

## 2023-12-03 DIAGNOSIS — I5032 Chronic diastolic (congestive) heart failure: Secondary | ICD-10-CM | POA: Diagnosis present

## 2023-12-03 DIAGNOSIS — D631 Anemia in chronic kidney disease: Secondary | ICD-10-CM | POA: Diagnosis present

## 2023-12-03 DIAGNOSIS — Z95828 Presence of other vascular implants and grafts: Secondary | ICD-10-CM

## 2023-12-03 DIAGNOSIS — W1830XA Fall on same level, unspecified, initial encounter: Secondary | ICD-10-CM | POA: Diagnosis present

## 2023-12-03 DIAGNOSIS — J9621 Acute and chronic respiratory failure with hypoxia: Secondary | ICD-10-CM | POA: Diagnosis present

## 2023-12-03 DIAGNOSIS — K8020 Calculus of gallbladder without cholecystitis without obstruction: Secondary | ICD-10-CM | POA: Diagnosis present

## 2023-12-03 DIAGNOSIS — Z7985 Long-term (current) use of injectable non-insulin antidiabetic drugs: Secondary | ICD-10-CM

## 2023-12-03 DIAGNOSIS — Z4682 Encounter for fitting and adjustment of non-vascular catheter: Secondary | ICD-10-CM

## 2023-12-03 DIAGNOSIS — Z955 Presence of coronary angioplasty implant and graft: Secondary | ICD-10-CM

## 2023-12-03 DIAGNOSIS — Z7984 Long term (current) use of oral hypoglycemic drugs: Secondary | ICD-10-CM

## 2023-12-03 DIAGNOSIS — E039 Hypothyroidism, unspecified: Secondary | ICD-10-CM | POA: Diagnosis present

## 2023-12-03 DIAGNOSIS — I700 Atherosclerosis of aorta: Secondary | ICD-10-CM | POA: Diagnosis present

## 2023-12-03 DIAGNOSIS — Z79899 Other long term (current) drug therapy: Secondary | ICD-10-CM

## 2023-12-03 DIAGNOSIS — J9601 Acute respiratory failure with hypoxia: Secondary | ICD-10-CM

## 2023-12-03 DIAGNOSIS — E875 Hyperkalemia: Secondary | ICD-10-CM | POA: Diagnosis not present

## 2023-12-03 DIAGNOSIS — J439 Emphysema, unspecified: Secondary | ICD-10-CM | POA: Diagnosis not present

## 2023-12-03 DIAGNOSIS — R9431 Abnormal electrocardiogram [ECG] [EKG]: Secondary | ICD-10-CM | POA: Diagnosis not present

## 2023-12-03 DIAGNOSIS — J449 Chronic obstructive pulmonary disease, unspecified: Secondary | ICD-10-CM | POA: Diagnosis present

## 2023-12-03 DIAGNOSIS — I959 Hypotension, unspecified: Secondary | ICD-10-CM | POA: Diagnosis present

## 2023-12-03 DIAGNOSIS — I2510 Atherosclerotic heart disease of native coronary artery without angina pectoris: Secondary | ICD-10-CM | POA: Diagnosis present

## 2023-12-03 DIAGNOSIS — G4733 Obstructive sleep apnea (adult) (pediatric): Secondary | ICD-10-CM | POA: Diagnosis not present

## 2023-12-03 DIAGNOSIS — J90 Pleural effusion, not elsewhere classified: Secondary | ICD-10-CM

## 2023-12-03 DIAGNOSIS — Z79890 Hormone replacement therapy (postmenopausal): Secondary | ICD-10-CM

## 2023-12-03 DIAGNOSIS — I7122 Aneurysm of the aortic arch, without rupture: Secondary | ICD-10-CM | POA: Diagnosis present

## 2023-12-03 DIAGNOSIS — J984 Other disorders of lung: Secondary | ICD-10-CM

## 2023-12-03 DIAGNOSIS — J9620 Acute and chronic respiratory failure, unspecified whether with hypoxia or hypercapnia: Secondary | ICD-10-CM | POA: Diagnosis present

## 2023-12-03 LAB — URINALYSIS, COMPLETE (UACMP) WITH MICROSCOPIC
Bilirubin Urine: NEGATIVE
Glucose, UA: >=500 mg/dL — AB
Hgb urine dipstick: NEGATIVE
Ketones, ur: 5 mg/dL — AB
Nitrite: NEGATIVE
Protein, ur: 30 mg/dL — AB
Specific Gravity, Urine: 1.011 (ref 1.005–1.030)
WBC, UA: >50 WBC/hpf (ref 0–5)
pH: 5 (ref 5.0–8.0)

## 2023-12-03 LAB — CREATININE, URINE, RANDOM: Creatinine, Urine: 64 mg/dL

## 2023-12-03 LAB — COMPREHENSIVE METABOLIC PANEL WITH GFR
ALT: 16 U/L (ref 0–44)
AST: 24 U/L (ref 15–41)
Albumin: 3.5 g/dL (ref 3.5–5.0)
Alkaline Phosphatase: 46 U/L (ref 38–126)
Anion gap: 14 (ref 5–15)
BUN: 52 mg/dL — ABNORMAL HIGH (ref 8–23)
CO2: 22 mmol/L (ref 22–32)
Calcium: 8.5 mg/dL — ABNORMAL LOW (ref 8.9–10.3)
Chloride: 98 mmol/L (ref 98–111)
Creatinine, Ser: 2.12 mg/dL — ABNORMAL HIGH (ref 0.61–1.24)
GFR, Estimated: 30 mL/min — ABNORMAL LOW (ref >60)
Glucose, Bld: 152 mg/dL — ABNORMAL HIGH (ref 70–99)
Potassium: 4.5 mmol/L (ref 3.5–5.1)
Sodium: 134 mmol/L — ABNORMAL LOW (ref 135–145)
Total Bilirubin: 1.1 mg/dL (ref 0.0–1.2)
Total Protein: 6.6 g/dL (ref 6.5–8.1)

## 2023-12-03 LAB — TROPONIN I (HIGH SENSITIVITY)
Troponin I (High Sensitivity): 33 ng/L — ABNORMAL HIGH (ref <18)
Troponin I (High Sensitivity): 33 ng/L — ABNORMAL HIGH (ref <18)

## 2023-12-03 LAB — CBC
HCT: 36.7 % — ABNORMAL LOW (ref 39.0–52.0)
Hemoglobin: 11.4 g/dL — ABNORMAL LOW (ref 13.0–17.0)
MCH: 28.2 pg (ref 26.0–34.0)
MCHC: 31.1 g/dL (ref 30.0–36.0)
MCV: 90.8 fL (ref 80.0–100.0)
Platelets: 150 10*3/uL (ref 150–400)
RBC: 4.04 MIL/uL — ABNORMAL LOW (ref 4.22–5.81)
RDW: 14.7 % (ref 11.5–15.5)
WBC: 11.3 10*3/uL — ABNORMAL HIGH (ref 4.0–10.5)
nRBC: 0 % (ref 0.0–0.2)

## 2023-12-03 LAB — RESP PANEL BY RT-PCR (RSV, FLU A&B, COVID)  RVPGX2
Influenza A by PCR: NEGATIVE
Influenza B by PCR: NEGATIVE
Resp Syncytial Virus by PCR: NEGATIVE
SARS Coronavirus 2 by RT PCR: NEGATIVE

## 2023-12-03 LAB — GLUCOSE, CAPILLARY: Glucose-Capillary: 263 mg/dL — ABNORMAL HIGH (ref 70–99)

## 2023-12-03 LAB — BRAIN NATRIURETIC PEPTIDE: B Natriuretic Peptide: 68.4 pg/mL (ref 0.0–100.0)

## 2023-12-03 LAB — PHOSPHORUS: Phosphorus: 4.3 mg/dL (ref 2.5–4.6)

## 2023-12-03 LAB — SODIUM, URINE, RANDOM: Sodium, Ur: 21 mmol/L

## 2023-12-03 LAB — MAGNESIUM: Magnesium: 1.7 mg/dL (ref 1.7–2.4)

## 2023-12-03 LAB — PROCALCITONIN: Procalcitonin: 0.13 ng/mL

## 2023-12-03 MED ORDER — METHYLPREDNISOLONE SODIUM SUCC 125 MG IJ SOLR
125.0000 mg | Freq: Once | INTRAMUSCULAR | Status: AC
  Administered 2023-12-03: 125 mg via INTRAVENOUS
  Filled 2023-12-03: qty 2

## 2023-12-03 MED ORDER — INSULIN ASPART 100 UNIT/ML IJ SOLN
0.0000 [IU] | INTRAMUSCULAR | Status: DC
  Administered 2023-12-04 (×3): 2 [IU] via SUBCUTANEOUS
  Administered 2023-12-04: 5 [IU] via SUBCUTANEOUS
  Administered 2023-12-04: 2 [IU] via SUBCUTANEOUS
  Administered 2023-12-04: 3 [IU] via SUBCUTANEOUS
  Administered 2023-12-05: 2 [IU] via SUBCUTANEOUS
  Administered 2023-12-05: 1 [IU] via SUBCUTANEOUS
  Administered 2023-12-05: 3 [IU] via SUBCUTANEOUS
  Administered 2023-12-05: 2 [IU] via SUBCUTANEOUS
  Administered 2023-12-05: 5 [IU] via SUBCUTANEOUS
  Administered 2023-12-06: 2 [IU] via SUBCUTANEOUS
  Administered 2023-12-06: 3 [IU] via SUBCUTANEOUS
  Administered 2023-12-06: 1 [IU] via SUBCUTANEOUS
  Administered 2023-12-06: 7 [IU] via SUBCUTANEOUS
  Administered 2023-12-06: 3 [IU] via SUBCUTANEOUS
  Administered 2023-12-07: 2 [IU] via SUBCUTANEOUS
  Administered 2023-12-07: 3 [IU] via SUBCUTANEOUS
  Administered 2023-12-07: 2 [IU] via SUBCUTANEOUS
  Administered 2023-12-07: 3 [IU] via SUBCUTANEOUS
  Administered 2023-12-07 (×2): 1 [IU] via SUBCUTANEOUS
  Filled 2023-12-03: qty 0.09

## 2023-12-03 MED ORDER — INSULIN GLARGINE-YFGN 100 UNIT/ML ~~LOC~~ SOLN
15.0000 [IU] | Freq: Every day | SUBCUTANEOUS | Status: DC
  Administered 2023-12-04 – 2023-12-07 (×5): 15 [IU] via SUBCUTANEOUS
  Filled 2023-12-03 (×6): qty 0.15

## 2023-12-03 MED ORDER — TAMSULOSIN HCL 0.4 MG PO CAPS
0.4000 mg | ORAL_CAPSULE | Freq: Every day | ORAL | Status: DC
  Administered 2023-12-04 – 2023-12-08 (×6): 0.4 mg via ORAL
  Filled 2023-12-03 (×6): qty 1

## 2023-12-03 MED ORDER — GUAIFENESIN ER 600 MG PO TB12
600.0000 mg | ORAL_TABLET | Freq: Two times a day (BID) | ORAL | Status: DC
  Administered 2023-12-04 – 2023-12-09 (×12): 600 mg via ORAL
  Filled 2023-12-03 (×12): qty 1

## 2023-12-03 MED ORDER — METOCLOPRAMIDE HCL 5 MG/ML IJ SOLN: 5.0000 mg | Freq: Four times a day (QID) | INTRAMUSCULAR | Status: DC | PRN

## 2023-12-03 MED ORDER — IOHEXOL 350 MG/ML SOLN
60.0000 mL | Freq: Once | INTRAVENOUS | Status: AC | PRN
  Administered 2023-12-03: 60 mL via INTRAVENOUS

## 2023-12-03 MED ORDER — TRANEXAMIC ACID FOR INHALATION
500.0000 mg | Freq: Three times a day (TID) | RESPIRATORY_TRACT | Status: DC
  Administered 2023-12-03 – 2023-12-07 (×11): 500 mg via RESPIRATORY_TRACT
  Filled 2023-12-03 (×12): qty 10

## 2023-12-03 MED ORDER — TRAZODONE HCL 50 MG PO TABS
50.0000 mg | ORAL_TABLET | Freq: Every day | ORAL | Status: DC
  Administered 2023-12-04 – 2023-12-08 (×6): 50 mg via ORAL
  Filled 2023-12-03 (×6): qty 1

## 2023-12-03 MED ORDER — ACETAMINOPHEN 325 MG PO TABS: 650.0000 mg | ORAL_TABLET | Freq: Four times a day (QID) | ORAL | Status: DC | PRN

## 2023-12-03 MED ORDER — LACTATED RINGERS IV BOLUS
1000.0000 mL | Freq: Once | INTRAVENOUS | Status: AC
  Administered 2023-12-03: 1000 mL via INTRAVENOUS

## 2023-12-03 MED ORDER — ALBUTEROL SULFATE (2.5 MG/3ML) 0.083% IN NEBU
10.0000 mg/h | INHALATION_SOLUTION | RESPIRATORY_TRACT | Status: AC
  Administered 2023-12-03: 10 mg/h via RESPIRATORY_TRACT
  Filled 2023-12-03: qty 3

## 2023-12-03 MED ORDER — SODIUM CHLORIDE 0.9 % IV SOLN: INTRAVENOUS | Status: AC

## 2023-12-03 MED ORDER — UMECLIDINIUM-VILANTEROL 62.5-25 MCG/ACT IN AEPB
1.0000 | INHALATION_SPRAY | Freq: Every day | RESPIRATORY_TRACT | Status: DC
  Administered 2023-12-04 – 2023-12-09 (×6): 1 via RESPIRATORY_TRACT
  Filled 2023-12-03: qty 14

## 2023-12-03 MED ORDER — ATORVASTATIN CALCIUM 40 MG PO TABS
40.0000 mg | ORAL_TABLET | Freq: Every day | ORAL | Status: DC
  Administered 2023-12-05 – 2023-12-09 (×5): 40 mg via ORAL
  Filled 2023-12-03 (×5): qty 1

## 2023-12-03 MED ORDER — ONDANSETRON HCL 4 MG PO TABS: 4.0000 mg | ORAL_TABLET | Freq: Four times a day (QID) | ORAL | Status: DC | PRN

## 2023-12-03 MED ORDER — PREDNISONE 10 MG PO TABS
10.0000 mg | ORAL_TABLET | Freq: Every day | ORAL | Status: AC
Start: 2023-12-04 — End: ?
  Administered 2023-12-05 – 2023-12-09 (×5): 10 mg via ORAL
  Filled 2023-12-03 (×5): qty 1

## 2023-12-03 MED ORDER — ONDANSETRON HCL 4 MG/2ML IJ SOLN: 4.0000 mg | Freq: Four times a day (QID) | INTRAMUSCULAR | Status: DC | PRN

## 2023-12-03 MED ORDER — IPRATROPIUM BROMIDE 0.02 % IN SOLN
0.5000 mg | RESPIRATORY_TRACT | Status: AC
  Administered 2023-12-03 (×3): 0.5 mg via RESPIRATORY_TRACT
  Filled 2023-12-03 (×3): qty 2.5

## 2023-12-03 MED ORDER — LEVOTHYROXINE SODIUM 25 MCG PO TABS
125.0000 ug | ORAL_TABLET | Freq: Every day | ORAL | Status: DC
  Administered 2023-12-05 – 2023-12-09 (×5): 125 ug via ORAL
  Filled 2023-12-03 (×5): qty 1

## 2023-12-03 MED ORDER — HYDROCODONE-ACETAMINOPHEN 5-325 MG PO TABS
1.0000 | ORAL_TABLET | ORAL | Status: DC | PRN
Start: 2023-12-03 — End: 2023-12-09
  Administered 2023-12-06 (×2): 2 via ORAL
  Administered 2023-12-09: 1 via ORAL
  Filled 2023-12-03 (×2): qty 2
  Filled 2023-12-03: qty 1

## 2023-12-03 MED ORDER — ALBUTEROL SULFATE (2.5 MG/3ML) 0.083% IN NEBU: 2.5000 mg | INHALATION_SOLUTION | Freq: Four times a day (QID) | RESPIRATORY_TRACT | Status: DC | PRN

## 2023-12-03 MED ORDER — ACETAMINOPHEN 650 MG RE SUPP: 650.0000 mg | Freq: Four times a day (QID) | RECTAL | Status: DC | PRN

## 2023-12-03 MED ORDER — PREGABALIN 75 MG PO CAPS
75.0000 mg | ORAL_CAPSULE | Freq: Two times a day (BID) | ORAL | Status: DC
  Administered 2023-12-04 – 2023-12-09 (×12): 75 mg via ORAL
  Filled 2023-12-03 (×12): qty 1

## 2023-12-03 MED ORDER — CHLORHEXIDINE GLUCONATE CLOTH 2 % EX PADS: 6.0000 | MEDICATED_PAD | Freq: Every day | CUTANEOUS | Status: DC

## 2023-12-03 NOTE — Assessment & Plan Note (Signed)
 Hold arava  for now

## 2023-12-03 NOTE — H&P (Incomplete)
 [name]:[id number] DOB: 14-Mar-1941 DOA: 12/03/2023     PCP: Adelia Homestead, MD   Outpatient Specialists:  CARDS:  Dr. Alexandria Angel, MD   Pulmonary    [facility]  Oncology   Dr. Lee Public    Patient arrived to ER on 12/03/23 at 1451 Referred by Attending Scarlette Currier, MD   Patient coming from:    home Lives alone,    Chief Complaint:   Chief Complaint  Patient presents with  . Cough    HPI: [name] is a 83 y.o. male with medical history significant of COPD on 3L of O2, DM2, OSA on CPAP, rheumatoid arthritis lung nodules CHF CAD CKD A-fib ITP chronic steroids, left common femoral to popliteal bypass in January 2022, pacemaker, status post autologous stem cell transplant for COPD in 2017, ascending aortic aneurysm    Presented with    hemoptysis Has severe COPD presetns with hemoptysis,  Had hemoptysis started 2 days ago at first with phlegm now coughing up blood clots Seen by Pulmonology who sent him to ER Seen him in ER chronic O2 requirement  CT in April showed possible malignacy  Not on eliquis  due to ITP   He is on Plavix  due to CAD    his annual CT angio chest aorta that was completed on November 17, 2023. This showed interval development of focal pleural thickening in the left superior hemothorax laterally measuring 6.7 x 4.3 cm and circumferential pleural thickening with a nodular contour. And a soft tissue attenuation adjacent to the left ventricle apex. Measuring 2.7 cm. Concern for possible underlying malignancy.  He was supposed to get a PET scan done in Dec 09, 2023 On arrival satting 89% 3 L started on 4 L  No fever or chills  Reports has not eating for the past 2 days Significant weight loss  Denies significant ETOH intake   Does not smoke       Regarding pertinent Chronic problems:    Hyperlipidemia -  on statins Lipitor  (atorvastatin )  Lipid Panel     Component Value Date/Time   CHOL 115 01/02/2023 0837   TRIG 111  01/02/2023 0837   HDL 34 (L) 01/02/2023 0837   CHOLHDL 3.4 01/02/2023 0837   CHOLHDL 4.0 08/23/2020 0350   VLDL 23 08/23/2020 0350   LDLCALC 60 01/02/2023 0837   LDLDIRECT 106.0 10/02/2016 1045   LABVLDL 21 01/02/2023 0837     HTN on Coreg  cozaar    chronic CHF diastolic  - last echo  Recent Results (from the past 24401 hours)  ECHOCARDIOGRAM COMPLETE   Collection Time: 03/23/19  2:24 PM   Narrative     ECHOCARDIOGRAM REPORT       Patient Name:   [name] Date of Exam: 03/23/2019 Medical Rec #:  [id number]          Height:       74.5 in Accession #:    [id number]         Weight:       215.0 lb Date of Birth:  May 19, 1941          BSA:          2.25 m Patient Age:    78 years           BP:           130/66 mmHg Patient Gender: M  HR:           71 bpm. Exam Location:  [place]    Procedure: 2D Echo  Indications:    Syncope   History:        Patient has prior history of Echocardiogram examinations, most                 recent 09/02/2017. CHF CAD COPD Risk Factors: Hypertension,                 Diabetes and Dyslipidemia.   Sonographer:    Libby Ree RDCS (AE) Referring Phys: [id number] BRIAN S CRENSHAW  IMPRESSIONS    1. The left ventricle has normal systolic function with an ejection fraction of 60-65%. The cavity size was normal. There is moderately increased left ventricular wall thickness. Left ventricular diastolic Doppler parameters are indeterminate. There is  abnormal septal motion consistent with left bundle branch block.  2. The right ventricle has normal systolic function. The cavity was normal. There is no increase in right ventricular wall thickness.  3. The aorta is normal unless otherwise noted.  4. The inferior vena cava was normal in size with <50% respiratory variability.        CAD  - On  statin, betablocker, Plavix                  -  followed by cardiology                   DM 2 -  Lab Results  Component Value Date   HGBA1C  10.3 (H) 08/21/2020   on insulin     Hypothyroidism:   Lab Results  Component Value Date   TSH 2.21 04/13/2020   on synthroid       COPD - followed by pulmonology    on baseline oxygen   3 L,      OSA -on nocturnal oxygen ,  CPAP,       A. Fib -   atrial fibrillation CHA2DS2 vas score   6     Not on anticoagulation secondary toITP        -  Rate control:  Currently controlled with Coreg           - Rhythm control: off  amiodarone ,    CKD stage IIIb  baseline Cr 1.7 Estimated Creatinine Clearance: 31.2 mL/min (A) (by C-G formula based on SCr of 2.12 mg/dL (H)).  Lab Results  Component Value Date   CREATININE 2.12 (H) 12/03/2023   CREATININE 1.70 (H) 11/17/2023   CREATININE 1.19 01/02/2023   Lab Results  Component Value Date   NA 134 (L) 12/03/2023   CL 98 12/03/2023   K 4.5 12/03/2023   CO2 22 12/03/2023   BUN 52 (H) 12/03/2023   CREATININE 2.12 (H) 12/03/2023   GFRNONAA 30 (L) 12/03/2023   CALCIUM  8.5 (L) 12/03/2023   PHOS 3.8 02/18/2018   ALBUMIN  3.5 12/03/2023   GLUCOSE 152 (H) 12/03/2023    Hepatic Function Panel     Component Value Date/Time   PROT 6.6 12/03/2023 1755   PROT 6.2 01/02/2023 0837   ALBUMIN  3.5 12/03/2023 1755   ALBUMIN  4.1 01/02/2023 0837   AST 24 12/03/2023 1755   AST 22 07/15/2018 1059   ALT 16 12/03/2023 1755   ALT 15 07/15/2018 1059   ALKPHOS 46 12/03/2023 1755   BILITOT 1.1 12/03/2023 1755   BILITOT 0.5 01/02/2023 0837   BILITOT 0.6 07/15/2018 1059   BILIDIR 0.3 08/31/2017 2055  BILIDIR 0.27 12/23/2016 1053   IBILI 0.4 08/31/2017 2055   No results found for requested labs within last 1095 days.       Chronic anemia - baseline hg Hemoglobin & Hematocrit  Recent Labs    10/06/23 1442 12/01/23 1337 12/03/23 1607  HGB 13.1 12.4* 11.4*   Iron /TIBC/Ferritin/ %Sat    Component Value Date/Time   IRON  49 07/15/2018 1101   TIBC 311 07/15/2018 1101   FERRITIN 170 07/15/2018 1101   IRONPCTSAT 16 (L) 07/15/2018 1101    While  in ER:    Seen by pulmonology Initiated TXA nebs x 3 Imaging concerning for malignancy Cannot rule out hemothorax may need thoracentesis tomorrow    Lab Orders         Resp panel by RT-PCR (RSV, Flu A&B, Covid) Anterior Nasal Swab         CBC         Brain natriuretic peptide         Comprehensive metabolic panel with GFR        CXR - Opacification of the left apex concerning for a mass/malignancy.      CTA chest -  Enlarging soft tissue density in the left apex which has central decreased attenuation but some peripheral nodular areas of enhancement. The significant enlargement over the past 2 weeks suggests that this likely represents an enlarging effusion/hemothorax. The possibility of neoplasm however is not totally excluded.  No pE  Following Medications were ordered in ER: Medications  albuterol  (PROVENTIL ) (2.5 MG/3ML) 0.083% nebulizer solution (0 mg/hr Nebulization Stopped 12/03/23 1924)  tranexamic acid (CYKLOKAPRON) 1000 MG/10ML nebulizer solution 500 mg (500 mg Nebulization Given 12/03/23 2032)  umeclidinium-vilanterol (ANORO ELLIPTA) 62.5-25 MCG/ACT 1 puff (has no administration in time range)  ipratropium (ATROVENT ) nebulizer solution 0.5 mg (0.5 mg Nebulization Given 12/03/23 1751)  methylPREDNISolone  sodium succinate (SOLU-MEDROL ) 125 mg/2 mL injection 125 mg (125 mg Intravenous Given 12/03/23 1701)  lactated ringers  bolus 1,000 mL (0 mLs Intravenous Stopped 12/03/23 2121)  iohexol  (OMNIPAQUE ) 350 MG/ML injection 60 mL (60 mLs Intravenous Contrast Given 12/03/23 2015)    _______________________________________________________ ER Provider Called:   PCCM Lemmie Pyo, NP  They Recommend admit to medicine    SEEN in ER     ED Triage Vitals  Encounter Vitals Group     BP 12/03/23 1511 (!) 113/48     Systolic BP Percentile --      Diastolic BP Percentile --      Pulse Rate 12/03/23 1511 67     Resp 12/03/23 1511 20     Temp 12/03/23 1511 98.5 F (36.9 C)      Temp Source 12/03/23 1511 Oral     SpO2 12/03/23 1511 95 %     Weight 12/03/23 1515 190 lb (86.2 kg)     Height 12/03/23 1515 6\' 2"  (1.88 m)     Head Circumference --      Peak Flow --      Pain Score 12/03/23 1515 6     Pain Loc --      Pain Education --      Exclude from Growth Chart --   NGEX(52)@     _________________________________________ Significant initial  Findings: Abnormal Labs Reviewed  CBC - Abnormal; Notable for the following components:      Result Value   WBC 11.3 (*)    RBC 4.04 (*)    Hemoglobin 11.4 (*)    HCT 36.7 (*)    All  other components within normal limits  COMPREHENSIVE METABOLIC PANEL WITH GFR - Abnormal; Notable for the following components:   Sodium 134 (*)    Glucose, Bld 152 (*)    BUN 52 (*)    Creatinine, Ser 2.12 (*)    Calcium  8.5 (*)    GFR, Estimated 30 (*)    All other components within normal limits  TROPONIN I (HIGH SENSITIVITY) - Abnormal; Notable for the following components:   Troponin I (High Sensitivity) 33 (*)    All other components within normal limits  TROPONIN I (HIGH SENSITIVITY) - Abnormal; Notable for the following components:   Troponin I (High Sensitivity) 33 (*)    All other components within normal limits      _________________________ Troponin   Cardiac Panel (last 3 results) Recent Labs    12/03/23 1607 12/03/23 1755  TROPONINIHS 33* 33*     ECG: Ordered Personally reviewed and interpreted by me showing: HR : 75 Rhythm: Atrial fibrillation IVCD, consider atypical RBBB LVH with secondary repolarization abnormality Baseline wander in lead(s) V5 QTC 540  BNP (last 3 results) Recent Labs    12/03/23 1607  BNP 68.4       The recent clinical data is shown below. Vitals:   12/03/23 1730 12/03/23 1739 12/03/23 1845 12/03/23 1923  BP:    135/76  Pulse: 68   79  Resp: (!) 21   19  Temp:   98.4 F (36.9 C)   TempSrc:   Oral   SpO2: 100% 100%  94%  Weight:      Height:          WBC      Component Value Date/Time   WBC 11.3 (H) 12/03/2023 1607   LYMPHSABS 1.0 12/01/2023 1337   LYMPHSABS 1.2 12/23/2016 1053   MONOABS 0.7 12/01/2023 1337   EOSABS 0.1 12/01/2023 1337   EOSABS 0.3 12/23/2016 1053   BASOSABS 0.0 12/01/2023 1337   BASOSABS 0.0 12/23/2016 1053     Procalcitonin   Ordered      UA  ordered    Results for orders placed or performed during the hospital encounter of 12/03/23  Resp panel by RT-PCR (RSV, Flu A&B, Covid) Anterior Nasal Swab     Status: None   Collection Time: 12/03/23  4:07 PM   Specimen: Anterior Nasal Swab  Result Value Ref Range Status   SARS Coronavirus 2 by RT PCR NEGATIVE NEGATIVE Final         Influenza A by PCR NEGATIVE NEGATIVE Final   Influenza B by PCR NEGATIVE NEGATIVE Final         Resp Syncytial Virus by PCR NEGATIVE NEGATIVE Final         *Note: Due to a large number of results and/or encounters for the requested time period, some results have not been displayed. A complete set of results can be found in Results Review.      __________________________________________________________ Recent Labs  Lab 12/03/23 1755  NA 134*  K 4.5  CO2 22  GLUCOSE 152*  BUN 52*  CREATININE 2.12*  CALCIUM  8.5*    Cr     Up from baseline see below Lab Results  Component Value Date   CREATININE 2.12 (H) 12/03/2023   CREATININE 1.70 (H) 11/17/2023   CREATININE 1.19 01/02/2023    Recent Labs  Lab 12/03/23 1755  AST 24  ALT 16  ALKPHOS 46  BILITOT 1.1  PROT 6.6  ALBUMIN  3.5   Lab Results  Component Value Date   CALCIUM  8.5 (L) 12/03/2023   PHOS 3.8 02/18/2018    Plt: Lab Results  Component Value Date   PLT 150 12/03/2023    Recent Labs  Lab 12/01/23 1337 12/03/23 1607  WBC 10.9* 11.3*  NEUTROABS 8.9*  --   HGB 12.4* 11.4*  HCT 37.8* 36.7*  MCV 86.1 90.8  PLT 150 150    HG/HCT stable,       Component Value Date/Time   HGB 11.4 (L) 12/03/2023 1607   HGB 12.4 (L) 12/01/2023 1337   HGB 12.0 (L)  06/28/2019 1453   HCT 36.7 (L) 12/03/2023 1607   HCT 38.4 06/28/2019 1453   MCV 90.8 12/03/2023 1607   MCV 89 06/28/2019 1453    _______________________________________________ Hospitalist was called for admission for hemoptysis   The following Work up has been ordered so far:  Orders Placed This Encounter  Procedures  . Resp panel by RT-PCR (RSV, Flu A&B, Covid) Anterior Nasal Swab  . DG Chest Port 1 View  . CT Angio Chest PE W and/or Wo Contrast  . CBC  . Brain natriuretic peptide  . Comprehensive metabolic panel with GFR  . ED Cardiac monitoring  . Check Peak Flow  . Initiate Carrier Fluid Protocol  . Consult to pulmonology  . Consult to hospitalist  . ED Pulse oximetry, continuous  . CPAP  . ED EKG  . EKG 12-Lead  . EKG 12-Lead  . Saline lock IV     OTHER Significant initial  Findings:  labs showing:     DM  labs:  HbA1C: No results for input(s): "HGBA1C" in the last 8760 hours.     CBG (last 3)  No results for input(s): "GLUCAP" in the last 72 hours.        Cultures:    Component Value Date/Time   SDES  08/31/2017 2145    URINE, RANDOM Performed at [facility], [address] [place], [place] [address]    [facility]  08/31/2017 2145    NONE Performed at [facility], [address] [place], [place] [address]    CULT 50,000 COLONIES/mL ENTEROCOCCUS FAECALIS (A) 08/31/2017 2145   REPTSTATUS 09/03/2017 FINAL 08/31/2017 2145     Radiological Exams on Admission: CT Angio Chest PE W and/or Wo Contrast Result Date: 12/03/2023 CLINICAL DATA:  Hemoptysis for several days, initial encounter EXAM: CT ANGIOGRAPHY CHEST WITH CONTRAST TECHNIQUE: Multidetector CT imaging of the chest was performed using the standard protocol during bolus administration of intravenous contrast. Multiplanar CT image reconstructions and MIPs were obtained to evaluate the vascular anatomy. RADIATION DOSE REDUCTION: This exam was performed according to the  departmental dose-optimization program which includes automated exposure control, adjustment of the mA and/or kV according to patient size and/or use of iterative reconstruction technique. CONTRAST:  60mL OMNIPAQUE  IOHEXOL  350 MG/ML SOLN COMPARISON:  Chest x-ray from earlier in the same day. CT from 10/16/2022 and 11/17/2023 FINDINGS: Cardiovascular: Atherosclerotic calcifications of the thoracic aorta and its branches are noted. Stable saccular aneurysmal dilatation of the distal thoracic aortic arch is noted extending inferiorly. This measures approximately 4.1 cm in greatest dimension. Descending thoracic aorta shows no aneurysmal dilatation or dissection. The pulmonary artery shows a normal branching pattern bilaterally. No intraluminal filling defect to suggest pulmonary embolism is noted. Mediastinum/Nodes: Thoracic inlet is within normal limits. The esophagus as visualized is unremarkable. Scattered small mediastinal nodes are seen. Lungs/Pleura: Calcified pleural plaques are again seen on the right.  Emphysematous changes and areas of scarring are noted within the right lung. Small dependent effusion is noted in the left hemithorax. There is been significant increase in the soft tissue density along the left pleural margin measuring up to 10.3 x 9.5 cm on today's exam. It previously measured 6.7 x 4.3 cm. The nodular areas of enhancement peripherally are again seen. This masslike density displaces the adjacent left upper lobe. Considerable emphysematous changes are seen. Upper Abdomen: Visualized upper abdomen demonstrates multiple gallstones within the gallbladder without complicating factors. Musculoskeletal: Old rib fractures are seen on the left and stable Review of the MIP images confirms the above findings. IMPRESSION: Enlarging soft tissue density in the left apex which has central decreased attenuation but some peripheral nodular areas of enhancement. The significant enlargement over the past 2 weeks  suggests that this likely represents an enlarging effusion/hemothorax. The possibility of neoplasm however is not totally excluded. Further workup is recommended. Chronic calcified pleural plaquing on the right. Cholelithiasis without complicating factors. 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 Continued follow-up as previously described No evidence of pulmonary embolism. Aortic Atherosclerosis (ICD10-I70.0). Electronically Signed   By: Violeta Grey M.D.   On: 12/03/2023 21:05   DG Chest Port 1 View Result Date: 12/03/2023 CLINICAL DATA:  Cough, shortness of breath. EXAM: PORTABLE CHEST 1 VIEW COMPARISON:  Same day. FINDINGS: Stable cardiomediastinal silhouette. Left-sided pacemaker is unchanged. Calcified pleural plaques are noted in right lung consistent with asbestos exposure. Stable large masslike density is noted in left upper lobe concerning for neoplasm or malignancy. Minimal bibasilar subsegmental atelectasis is noted. Bony thorax is unremarkable. IMPRESSION: Stable large masslike density is noted and left lung apex highly concerning for neoplasm or malignancy. Calcified pleural plaques are noted in right lung concerning for asbestos exposure. Minimal bibasilar subsegmental atelectasis. Electronically Signed   By: Rosalene Colon M.D.   On: 12/03/2023 16:34   DG Chest 2 View Result Date: 12/03/2023 CLINICAL DATA:  Hemoptysis. EXAM: CHEST - 2 VIEW COMPARISON:  Chest radiograph dated 09/19/2022. chest CT dated 11/17/2023. FINDINGS: There is opacification of the left apex concerning for a mass/malignancy. Background of emphysema. Right lung pleural parenchyma calcification. No pneumothorax. The cardiac silhouette is within normal limits. Left pectoral pacemaker device. No acute osseous pathology. IMPRESSION: Opacification of the left apex concerning for a mass/malignancy. Electronically Signed   By: Angus Bark M.D.   On: 12/03/2023 13:46    _______________________________________________________________________________________________________ Latest  Blood pressure 135/76, pulse 79, temperature 98.4 F (36.9 C), temperature source Oral, resp. rate 19, height 6\' 2"  (1.88 m), weight 86.2 kg, SpO2 94%.   Vitals  labs and radiology finding personally reviewed  Review of Systems:    Pertinent dyspnea on exertion, positives include:  fatigue, coughing up of blood Constitutional:  No weight loss, night sweats, Fevers, chills,  weight loss  HEENT:  No headaches, Difficulty swallowing,Tooth/dental problems,Sore throat,  No sneezing, itching, ear ache, nasal congestion, post nasal drip,  Cardio-vascular:  No chest pain, Orthopnea, PND, anasarca, dizziness, palpitations.no Bilateral lower extremity swelling  GI:  No heartburn, indigestion, abdominal pain, nausea, vomiting, diarrhea, change in bowel habits, loss of appetite, melena, blood in stool, hematemesis Resp:  no shortness of breath at rest. No  No excess mucus, no productive cough, No non-productive cough, No .No change in color of mucus.No wheezing. Skin:  no rash or lesions. No jaundice GU:  no dysuria, change in color of urine, no urgency or frequency. No straining to urinate.  No flank pain.  Musculoskeletal:  No joint pain or no joint swelling. No decreased range of motion. No back pain.  Psych:  No change in mood or affect. No depression or anxiety. No memory loss.  Neuro: no localizing neurological complaints, no tingling, no weakness, no double vision, no gait abnormality, no slurred speech, no confusion  All systems reviewed and apart from HOPI all are negative _______________________________________________________________________________________________ Past Medical History:   Past Medical History:  Diagnosis Date  . 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 Anxiety   . Aortic aneurysm (HCC) 09/02/2017  . BENIGN PROSTATIC HYPERTROPHY 11/23/2009  .  Cardiomyopathy (HCC) 08/28/2016  . Chronic systolic CHF (congestive heart failure) (HCC) 09/02/2017  . COPD (chronic obstructive pulmonary disease) (HCC)   . CORONARY ARTERY DISEASE 11/23/2009  . 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 DEGENERATIVE JOINT DISEASE 11/23/2009  . DEPRESSION 11/23/2009  . FATIGUE 11/23/2009  . GAIT DISTURBANCE 12/10/2009  . HEMOPTYSIS UNSPECIFIED 05/07/2010  . High cholesterol   . HYPERTENSION 07/30/2009  . HYPOTHYROIDISM 07/30/2009  . Ischemic cardiomyopathy 09/02/2017  . 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 On home oxygen  therapy    "2-3L; 24/7" (09/10/2016)  . OSA on CPAP   . Pneumonia   . PTSD (post-traumatic stress disorder) 03/10/2012  . 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 RA (rheumatoid arthritis) (HCC) 06/11/2011   "qwhere" (09/10/2016)  . RESPIRATORY FAILURE, CHRONIC 07/31/2009  . Scleritis of both eyes 03/17/2014  . Thrombocytopenia (HCC)   . 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 Type II diabetes mellitus (HCC)     Past Surgical History:  Procedure Laterality Date  . ABDOMINAL AORTIC ANEURYSM REPAIR  07/2002   Maximo Spar 12/10/2010  . ABDOMINAL AORTOGRAM W/LOWER EXTREMITY N/A 08/16/2020   Procedure: ABDOMINAL AORTOGRAM W/LOWER EXTREMITY;  Surgeon: Young Hensen, MD;  Location: [facility] INVAS[facility];  Service: Cardiovascular;  Laterality: N/A;  . ABDOMINAL AORTOGRAM W/LOWER EXTREMITY N/A 10/24/2021   Procedure: ABDOMINAL AORTOGRAM W/LOWER EXTREMITY;  Surgeon: Young Hensen, MD;  Location: [facility];  Service: Cardiovascular;  Laterality: N/A;  . ABDOMINAL AORTOGRAM W/LOWER EXTREMITY N/A 03/13/2022   Procedure: ABDOMINAL AORTOGRAM W/LOWER EXTREMITY;  Surgeon: Young Hensen, MD;  Location: [facility];  Service: Cardiovascular;  Laterality: N/A;  . ABDOMINAL EXPLORATION SURGERY  02/2004   w/LOA/notes 12/10/2010; small bowel obstruction repair with adhesiolysis   . BACK SURGERY    . CARDIAC CATHETERIZATION     2 heart caths in the past.  One in 2000s  showed one ulcerated plaque  Rx medically; Second at [facility] Maximo Spar 09/05/2016  . CATARACT EXTRACTION W/ INTRAOCULAR LENS  IMPLANT, BILATERAL Bilateral 2000s  . COLECTOMY     hx of remote ileum resection due to bleeding  . COLONOSCOPY WITH PROPOFOL  N/A 01/22/2018   Procedure: COLONOSCOPY WITH PROPOFOL ;  Surgeon: Kenney Peacemaker, MD;  Location: [facility] ENDOSCOPY;  Service: Endoscopy;  Laterality: N/A;  . CORONARY ANGIOPLASTY WITH STENT PLACEMENT  09/10/2016  . CORONARY STENT INTERVENTION N/A 09/10/2016   Procedure: Coronary Stent Intervention;  Surgeon: Odie Benne, MD;  Location: [facility] INVASIVE CV LAB;  Service: Cardiovascular;  Laterality: N/A;  Distal RCA 4.0x16 Synergy  . ESOPHAGOGASTRODUODENOSCOPY (EGD) WITH PROPOFOL  N/A 01/22/2018   Procedure: ESOPHAGOGASTRODUODENOSCOPY (EGD) WITH PROPOFOL ;  Surgeon: Kenney Peacemaker, MD;  Location: [facility] ENDOS[facility];  Service: Endoscopy;  Laterality: N/A;  . EYE SURGERY    . FALSE ANEURYSM REPAIR Left 08/22/2020   Procedure: EXCLUSION OF LEFT POPLITEAL ARTERY ANEURYSM;  Surgeon: Fulton Job,  Marine Sia, MD;  Location: [facility] OR;  Service: Vascular;  Laterality: Left;  . FEMORAL EMBOLOECTOMY Left 07/2000   with left leg ischemia; Dr. Timm Foot, vascular  . FEMORAL-POPLITEAL BYPASS GRAFT Bilateral 08/22/2020   Procedure: LEFT FEMORAL-POSTERIOR TIBIAL ARTERY BYPASS GRAFT USING RIGHT GREATER NONREVERSED SAPHENOUS VEIN GRAFT;  Surgeon: Young Hensen, MD;  Location: [facility] OR;  Service: Vascular;  Laterality: Bilateral;  . GANGLION CYST EXCISION Right    "wrist"; Dr. Lorena Rolling  . HOT HEMOSTASIS N/A 01/22/2018   Procedure: HOT HEMOSTASIS (ARGON PLASMA COAGULATION/BICAP);  Surgeon: Kenney Peacemaker, MD;  Location: [facility];  Service: Endoscopy;  Laterality: N/A;  . LOOP RECORDER INSERTION N/A 04/25/2019   Procedure: LOOP RECORDER INSERTION;  Surgeon: Verona Goodwill, MD;  Location[facility] INVASIVE CV LAB;  Service: Cardiovascular;  Laterality: N/A;  . LOOP RECORDER REMOVAL N/A 07/01/2019    Procedure: LOOP RECORDER REMOVAL;  Surgeon: Verona Goodwill, MD;  Location: [facility] INVASIVE CV LAB;  Service: Cardiovascular;  Laterality: N/A;  . LUMBAR LAMINECTOMY  1972   Dr. Bobbye Burrow  . PACEMAKER IMPLANT N/A 07/01/2019   Procedure: PACEMAKER IMPLANT;  Surgeon: Verona Goodwill, MD;  Location: [facility] INVASIVE CV LAB;  Service: Cardiovascular;  Laterality: N/A;  . PERIPHERAL VASCULAR BALLOON ANGIOPLASTY Left 10/24/2021   Procedure: PERIPHERAL VASCULAR BALLOON ANGIOPLASTY;  Surgeon: Young Hensen, MD;  Location: [facility];  Service: Cardiovascular;  Laterality: Left;  . RIGHT/LEFT HEART CATH AND CORONARY ANGIOGRAPHY N/A 09/10/2016   Procedure: Right/Left Heart Cath and Coronary Angiography;  Surgeon: Odie Benne, MD;  Location: [facility] INVASIVE CV LAB;  Service: Cardiovascular;  Laterality: N/A;  . TONSILLECTOMY      Social History:  Ambulatory   walker      reports that he quit smoking about 25 years ago. His smoking use included cigarettes, pipe, and cigars. He started smoking about 65 years ago. He has a 100 pack-year smoking history. He has never been exposed to tobacco smoke. He has never used smokeless tobacco. He reports that he does not drink alcohol  and does not use drugs.     Family History:   Family History  Adopted: Yes  Problem Relation Age of Onset  . Other Mother        gun shot   ______________________________________________________________________________________________ Allergies: No Known Allergies   Prior to Admission medications   Medication Sig Start Date End Date Taking? Authorizing Provider  acetaminophen  (TYLENOL ) 500 MG tablet Take 1,000 mg by mouth every 6 (six) hours as needed for mild pain.    [provider]  albuterol  (PROVENTIL ) (2.5 MG/3ML) 0.083% nebulizer solution Take 3 mLs (2.5 mg total) by nebulization every 6 (six) hours as needed for wheezing or shortness of breath. 10/25/20   Parrett, Macdonald Savoy, NP  albuterol  (VENTOLIN  HFA)  108 (90 Base) MCG/ACT inhaler INHALE 2 PUFFS INTO THE LUNGS EVERY 6 HOURS AS NEEDED FOR WHEEZING OR SHORTNESS OF BREATH 04/17/23   Parrett, Macdonald Savoy, NP  atorvastatin  (LIPITOR ) 40 MG tablet Take 1 tablet (40 mg total) by mouth daily. 10/21/16   Elmyra Haggard, MD  Carboxymethylcellul-Glycerin (LUBRICATING EYE DROPS OP) Place 1 drop into both eyes 4 (four) times daily as needed (dry eyes).     [provider]  carvedilol  (COREG ) 3.125 MG tablet TAKE 1 TABLET(3.125 MG) BY MOUTH TWICE DAILY WITH A MEAL 07/20/23   Adelia Homestead, MD  Cholecalciferol  (VITAMIN D3) 2000 units capsule Take 2,000 Units by mouth daily.  01/11/16  [provider]  clopidogrel  (PLAVIX ) 75 MG tablet TAKE 1 TABLET(75 MG) BY MOUTH DAILY 09/08/23   Young Hensen, MD  Continuous Blood Gluc Receiver (DEXCOM G7 RECEIVER) DEVI Use to check blood sugars 3x a day. DX CODE: E11.69, E78.5 08/20/22   Adelia Homestead, MD  Continuous Blood Gluc Sensor (DEXCOM G7 SENSOR) MISC Use to check blood sugars 3x a day. DX CODE: E11.69, E78.5 08/20/22   Adelia Homestead, MD  Continuous Blood Gluc Transmit (DEXCOM G6 TRANSMITTER) MISC by Does not apply route.    [provider]  cyanocobalamin  (VITAMIN B12) 500 MCG tablet TAKE ONE TABLET BY MOUTH DAILY FOR LOW B12 LEVEL 07/30/22   [provider]  empagliflozin (JARDIANCE) 10 MG TABS tablet Take 5 mg by mouth daily. Filled at [place].    [provider]  ferrous sulfate  324 (65 Fe) MG TBEC Take 1 tablet by mouth daily. 10/02/21   [provider]  fluticasone  (FLONASE ) 50 MCG/ACT nasal spray Place 1 spray into both nostrils daily. Patient taking differently: Place 1 spray into both nostrils daily as needed for allergies. 07/17/21   Cobb, Mariah Shines, NP  imiquimod (ALDARA) 5 % cream APPLY THIN LAYER TO AFFECTED AREA THREE TIMES A WEEK APPLY TO WARTS 3-5 TIMES PER WEEK FOR 6 WEEKS OR UNTIL WARTS DISAPPEAR 12/08/22   [provider]   insulin  glargine (LANTUS ) 100 UNIT/ML injection Inject 30 Units into the skin at bedtime.    [provider]  leflunomide  (ARAVA ) 10 MG tablet TAKE ONE TABLET BY MOUTH DAILY FOR RHEUMATOID ARTHRITIS 08/01/22   [provider]  levothyroxine  (SYNTHROID ) 125 MCG tablet TAKE ONE TABLET BY MOUTH DAILY FOR THYROID  12/07/22   [provider]  losartan  (COZAAR ) 100 MG tablet Take 1 tablet (100 mg total) by mouth daily. 12/24/22   Lenise Quince, MD  metFORMIN  (GLUCOPHAGE ) 500 MG tablet Take 500-1,000 mg by mouth 2 (two) times daily with a meal. Take 1 tablet (500 mg) by mouth in the morning & take 2 tablets (1000 mg) by mouth in the evening.    [provider]  Multiple Vitamins-Minerals (MEGA MULTIVITAMIN FOR MEN PO) Take 1 tablet by mouth daily.    [provider]  OXYGEN  Inhale 2 L into the lungs continuous.    [provider]  predniSONE  (DELTASONE ) 10 MG tablet Take 1 tablet (10 mg total) by mouth daily with breakfast. 12/01/23   Cameron Cea, MD  pregabalin  (LYRICA ) 75 MG capsule Take 1 capsule (75 mg total) by mouth 2 (two) times daily. 03/27/22   Gudena, Vinay, MD  Semaglutide  (OZEMPIC , 1 MG/DOSE, Toast) Inject 1 mg into the skin every Friday.    [provider]  sertraline  (ZOLOFT ) 100 MG tablet TAKE ONE-HALF TABLET BY MOUTH EVERY MORNING AFTER BREAKFAST FOR MENTAL HEALTH 11/06/22   [provider]  tamsulosin  (FLOMAX ) 0.4 MG CAPS capsule Take 1 capsule (0.4 mg total) by mouth at bedtime. 09/14/17   Jobe Mulder, DO  Tiotropium Bromide -Olodaterol (STIOLTO RESPIMAT ) 2.5-2.5 MCG/ACT AERS Inhale 2 puffs into the lungs daily. 03/01/21   Parrett, Macdonald Savoy, NP  traZODone (DESYREL) 50 MG tablet TAKE ONE TABLET BY MOUTH AT BEDTIME FOR INSOMNIA 11/06/22   [provider]    ___________________________________________________________________________________________________ Physical Exam:    12/03/2023    7:23 PM 12/03/2023     5:30 PM 12/03/2023    5:00 PM  Vitals with BMI  Systolic 135  149  Diastolic 76  132  Pulse 79 68      1. General:  in No  Acute distress   Chronically ill   -appearing 2. Psychological: Alert and   Oriented 3. Head/ENT:  Dry Mucous Membranes                          Head Non traumatic, neck supple                        Poor Dentition 4. SKIN:  decreased Skin turgor,  Skin clean Dry and intact no rash    5. Heart: Regular rate and rhythm no  Murmur, no Rub or gallop 6. Lungs:  no wheezes or crackles  decreased BS 7. Abdomen: Soft,  non-tender, Non distended  bowel sounds present 8. Lower extremities: no clubbing, cyanosis, no  edema 9. Neurologically Grossly intact, moving all 4 extremities equally   10. MSK: Normal range of motion    Chart has been reviewed  ______________________________________________________________________________________________  Assessment/Plan  83 y.o. 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 Hemoptysis     No problem-specific Assessment & Plan notes found for this encounter.    Other plan as per orders.  DVT prophylaxis:  SCD      Code Status:   DNR/DNI    as per patient   I had personally discussed CODE STATUS with patient and family  ACP   none    Family Communication:   Family not at  Bedside    Diet npo post midnight   Disposition Plan:       To home once workup is complete and patient is stable  ***Following barriers for discharge:                             Chest pain *** Stroke *** Syncope ***work up is complete                            Electrolytes corrected                               Anemia corrected  h/H stable                             Pain controlled with PO medications                               Afebrile, white count improving able to transition to PO antibiotics                             Will need to be able to tolerate PO                            Will likely need home health, home O2, set up  Will need consultants to evaluate patient prior to discharge                           Work of breathing improves       Consult Orders  (From admission, onward)           Start     Ordered   12/03/23 2113  Consult to hospitalist  Once       Provider:  (Not yet assigned)  Question Answer Comment  Place call to: [facility]   Reason for Consult Admit      12/03/23 2112                               Would benefit from PT/OT eval prior to DC  Ordered                                      Consults called: Pulmonology    Admission status:  ED Disposition     ED Disposition  Admit   Condition  --   Comment  Hospital Area: [facility] [[id number]]  Level of Care: Stepdown [14]  Admit to SDU based on following criteria: Hemodynamic compromise or significant risk of instability:  Patient requiring short term acute titration and management of vasoactive drips, and invasive monitoring (i.e., CVP and Arterial line).  Admit to SDU based on following criteria: Respiratory Distress:  Frequent assessment and/or intervention to maintain adequate ventilation/respiration, pulmonary toilet, and respiratory treatment.  May admit patient to [facility] or [facility] if equivalent level of care is available:: No  Covid Evaluation: Confirmed COVID Negative  Diagnosis: Hemoptysis [161096]  Admitting Physician: Jillann Charette [[id number]]  Attending Physician: Wilmar Prabhakar [[id number]]  Certification:: I certify this patient will need inpatient services for at least 2 midnights  Expected Medical Readiness: 12/06/2023              inpatient     I Expect 2 midnight stay secondary to severity of patient's current illness need for inpatient interventions justified by the following: ***hemodynamic instability despite optimal treatment (tachycardia *hypotension * tachypnea *hypoxia, hypercapnia) *** Severe lab/radiological/exam abnormalities including:    There are no diagnoses linked to this encounter. and extensive comorbidities including: *substance abuse  *Chronic pain *DM2  * CHF * CAD  * COPD/asthma *Morbid Obesity * CKD *dementia *liver disease *history of stroke with residual deficits *  malignancy, * sickle cell disease  History of amputation Chronic anticoagulation  That are currently affecting medical management.   I expect  patient to be hospitalized for 2 midnights requiring inpatient medical care.  Patient is at high risk for adverse outcome (such as loss of life or disability) if not treated.  Indication for inpatient stay as follows:  Severe change from baseline regarding mental status Hemodynamic instability despite maximal medical therapy,  severe pain requiring acute inpatient management,  inability to maintain oral hydration   persistent chest pain despite medical management Need for operative/procedural  intervention New or worsening hypoxia ongoing suicidal ideations   Need for IV fluids,      Level of care    stepdown  indefinitely please discontinue once patient no longer qualifies COVID-19 Labs        Nakiah Osgood 12/03/2023, 10:11 PM ***  [facility]  after 2 AM please page floor coverage   If 7AM-7PM, please contact the day team taking care of the patient using [email or web address]

## 2023-12-03 NOTE — ED Triage Notes (Signed)
 Pt to er, pt states that he has been coughing up blood since Monday, states that he went to his pulmonologist, and she sent him here for more testing.  Pt states that he feels weak and is more sob.  Pt states that he is normally on 2L/min but is on three at this time.

## 2023-12-03 NOTE — Consult Note (Signed)
 NAME:  [name], MRN:  [id number], DOB:  Aug 09, 1940, LOS: 0 ADMISSION DATE:  12/03/2023, CONSULTATION DATE: 5/8 REFERRING MD: Dr. Tamela Fake, CHIEF COMPLAINT: Hemoptysis  History of Present Illness:  83 year old male with multiple medical problems as outlined below which are significant for severe COPD, chronic hypoxic respiratory failure on 2 L at home, obstructive sleep apnea on CPAP, rheumatoid arthritis, and atrial fibrillation.  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  He presented to the pulmonary clinic on 5/8 with complaints of hemoptysis.  Chest x-ray showed dense consolidation versus effusion versus atelectasis of the left upper lobe.  CT angiogram on 4/22 showed a loculated effusion in that location.  Patient complains of cough with large chunks of bright red blood but not lower than 1/4 cup. He was felt to be high risk of decompensation and was referred to the ED for further care. PCCM was consulted.   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), Anxiety, Aortic aneurysm (HCC) (09/02/2017), BENIGN PROSTATIC HYPERTROPHY (11/23/2009), Cardiomyopathy (HCC) (08/28/2016), Chronic systolic CHF (congestive heart failure) (HCC) (09/02/2017), COPD (chronic obstructive pulmonary disease) (HCC), CORONARY ARTERY DISEASE (11/23/2009), DECREASED HEARING, LEFT EAR (03/01/2010), DEGENERATIVE JOINT DISEASE (11/23/2009), DEPRESSION (11/23/2009), FATIGUE (11/23/2009), GAIT DISTURBANCE (12/10/2009), HEMOPTYSIS UNSPECIFIED (05/07/2010), High cholesterol, HYPERTENSION (07/30/2009), HYPOTHYROIDISM (07/30/2009), Ischemic cardiomyopathy (09/02/2017), LUMBAR RADICULOPATHY, 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   Significant Hospital Events: Including procedures, antibiotic start and stop dates in addition to other pertinent events   5/8 admit  Interim History / Subjective:    Objective    Blood pressure (!) 149/132, pulse 68, temperature 98.5 F (36.9 C), temperature source Oral, resp. rate (!) 21, height 6\' 2"  (1.88 m), weight 86.2 kg, SpO2 100%.       No intake or output data in the 24 hours ending 12/03/23 1820 Filed Weights   12/03/23 1515  Weight: 86.2 kg    Examination: General: Elderly gentleman in NAD HENT: [place]/AT, no JVD Lungs: Clear bilateral breath sounds. No wheeze Cardiovascular: RRR, no MRG Abdomen: Soft, NT, ND Extremities: No acute deformity or ROM limitation Neuro: Alert, oriented, non-focal  Resolved Hospital Problem list:    Assessment & Plan:   Hemoptysis: sounds like not a significant amount of bleeding, but certainly with his poor reserves from chronic lung disease it won't take much to really cause him some distress. Sats were quite poor when he initially arrived to the ED (70s).  - Supplemental O2 as needed to keep O2 sats 88-95% - TXA nebs x 3  LUL opacity: I suspect this will be loculated effusion as he did have a small effusion on 4/22 CT in the same location. Now it is quite a bit larger by Xray. He fell about a week ago and landed on the left side so this may be hemothorax. Could also be collapse secondary to blood clot or malignancy.  - Will obtain CT scan to better characterize the left upper lobe opacity.   COPD without acute exacerbation - Anoro in place of home stiolto  Chronic hypoxemic respiratory failure on 2-3L home oxygen  therapy -  Sat goal 88-95% - On 3 L now  Remainder per primary   Best Practice (right click and "Reselect all SmartList  Selections" daily)   Per primary  Labs   CBC: Recent Labs  Lab 12/01/23 1337 12/03/23 1607  WBC 10.9* 11.3*  NEUTROABS 8.9*  --   HGB 12.4* 11.4*  HCT 37.8* 36.7*  MCV 86.1 90.8  PLT 150 150    Basic Metabolic Panel: No results for input(s): "NA", "K", "CL", "CO2", "GLUCOSE", "BUN", "CREATININE", "CALCIUM ", "MG", "PHOS" in the last 168 hours. GFR: Estimated Creatinine Clearance: 39 mL/min (A) (by C-G formula based on SCr of 1.7 mg/dL (H)). Recent Labs  Lab 12/01/23 1337 12/03/23 1607  WBC 10.9* 11.3*    Liver Function Tests: No results for input(s): "AST", "ALT", "ALKPHOS", "BILITOT", "PROT", "ALBUMIN " in the last 168 hours. No results for input(s): "LIPASE", "AMYLASE" in the last 168 hours. No results for input(s): "AMMONIA" in the last 168 hours.  ABG    Component Value Date/Time   PHART 7.328 (L) 08/22/2020 1312   PCO2ART 48.6 (H) 08/22/2020 1312   PO2ART 119 (H) 08/22/2020 1312   HCO3 25.5 08/22/2020 1312   TCO2 29 03/13/2022 0736   ACIDBASEDEF 1.0 08/22/2020 1312   O2SAT 98.0 08/22/2020 1312     Coagulation Profile: No results for input(s): "INR", "PROTIME" in the last 168 hours.  Cardiac Enzymes: No results for input(s): "CKTOTAL", "CKMB", "CKMBINDEX", "TROPONINI" in the last 168 hours.  HbA1C: Hemoglobin A1C  Date/Time Value Ref Range Status  02/17/2018 12:00 AM 8.2  Final   Hgb A1c MFr Bld  Date/Time Value Ref Range Status  08/21/2020 11:09 AM 10.3 (H) 4.8 - 5.6 % Final    Comment:    (NOTE) Pre diabetes:          5.7%-6.4%  Diabetes:              >6.4%  Glycemic control for   <7.0% adults with diabetes   04/13/2020 09:15 AM 9.3 (H) <5.7 % of total Hgb Final    Comment:    For someone without known diabetes, a hemoglobin A1c value of 6.5% or greater indicates that they may have  diabetes and this should be confirmed with a follow-up  test. . For someone with known diabetes, a value <7% indicates  that their diabetes is well  controlled and a value  greater than or equal to 7% indicates suboptimal  control. A1c targets should be individualized based on  duration of diabetes, age, comorbid conditions, and  other considerations. . Currently, no consensus exists regarding use of hemoglobin A1c for diagnosis of diabetes for children. .     CBG: No results for input(s): "GLUCAP" in the last 168 hours.  Review of Systems:   Bolds are positive  Constitutional: weight loss, gain, night sweats, Fevers, chills, fatigue .  HEENT: headaches, Sore throat, sneezing, nasal congestion, post nasal drip, Difficulty swallowing, Tooth/dental problems, visual complaints visual changes, ear ache CV:  chest pain, radiates:,Orthopnea, PND, swelling in lower extremities, dizziness, palpitations, syncope.  GI  heartburn, indigestion, abdominal pain, nausea, vomiting, diarrhea, change in bowel habits, loss of appetite, bloody stools.  Resp: cough, productive: , hemoptysis, dyspnea, chest pain, pleuritic.  Skin: rash or itching or icterus GU: dysuria, change in color of urine, urgency or frequency. flank pain, hematuria  MS: joint pain or swelling. decreased range of motion  Psych: change in mood or affect. depression or anxiety.  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/01/2010), DEGENERATIVE JOINT DISEASE (11/23/2009), DEPRESSION (11/23/2009), FATIGUE (11/23/2009), GAIT DISTURBANCE (12/10/2009), HEMOPTYSIS UNSPECIFIED (05/07/2010), High cholesterol, HYPERTENSION (07/30/2009), HYPOTHYROIDISM (07/30/2009), Ischemic cardiomyopathy  (09/02/2017), LUMBAR RADICULOPATHY, 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   Surgical History:   Past Surgical History:  Procedure Laterality Date   ABDOMINAL AORTIC ANEURYSM REPAIR  07/2002   Maximo Spar 12/10/2010   ABDOMINAL AORTOGRAM W/LOWER EXTREMITY N/A 08/16/2020   Procedure: ABDOMINAL AORTOGRAM W/LOWER EXTREMITY;  Surgeon: Young Hensen, MD;  Location: [facility];  Service: Cardiovascular;  Laterality: N/A;   ABDOMINAL AORTOGRAM W/LOWER EXTREMITY N/A 10/24/2021   Procedure: ABDOMINAL AORTOGRAM W/LOWER EXTREMITY;  Surgeon: Young Hensen, MD;  Location: [facility];  Service: Cardiovascular;  Laterality: N/A;   ABDOMINAL AORTOGRAM W/LOWER EXTREMITY N/A 03/13/2022   Procedure: ABDOMINAL AORTOGRAM W/LOWER EXTREMITY;  Surgeon: Young Hensen, MD;  Location: [facility];  Service: Cardiovascular;  Laterality: N/A;   ABDOMINAL EXPLORATION SURGERY  02/2004   w/LOA/notes 12/10/2010; small bowel obstruction repair with adhesiolysis    BACK SURGERY     CARDIAC CATHETERIZATION     2 heart caths in the past.  One in 2000s showed one ulcerated plaque  Rx medically; Second [facility] /notes 09/05/2016   CATARACT EXTRACTION W/ INTRAOCULAR LENS  IMPLANT, BILATERAL Bilateral 2000s   COLECTOMY     hx of remote ileum resection due to bleeding   COLONOSCOPY WITH PROPOFOL  N/A 01/22/2018   Procedure: COLONOSCOPY WITH PROPOFOL ;  Surgeon: Kenney Peacemaker, MD;  Location: [facility];  Service: Endoscopy;  Laterality: N/A;   CORONARY ANGIOPLASTY WITH STENT PLACEMENT  09/10/2016   CORONARY STENT INTERVENTION N/A 09/10/2016   Procedure: Coronary Stent Intervention;  Surgeon:  Odie Benne, MD;  Location: [facility];  Service: Cardiovascular;  Laterality: N/A;  Distal RCA 4.0x16 Synergy   ESOPHAGOGASTRODUODENOSCOPY (EGD) WITH PROPOFOL  N/A 01/22/2018   Procedure: ESOPHAGOGASTRODUODENOSCOPY (EGD) WITH PROPOFOL ;  Surgeon: Kenney Peacemaker, MD;  Location: [facility];  Service: Endoscopy;  Laterality: N/A;   EYE SURGERY     FALSE ANEURYSM REPAIR Left 08/22/2020   Procedure: EXCLUSION OF LEFT POPLITEAL ARTERY ANEURYSM;  Surgeon: Young Hensen, MD;  Location: [facility] OR;  Service: Vascular;  Laterality: Left;   FEMORAL EMBOLOECTOMY Left 07/2000   with left leg ischemia; Dr. Timm Foot, vascular   FEMORAL-POPLITEAL BYPASS GRAFT Bilateral 08/22/2020   Procedure: LEFT FEMORAL-POSTERIOR TIBIAL ARTERY BYPASS GRAFT USING RIGHT GREATER NONREVERSED SAPHENOUS VEIN GRAFT;  Surgeon: Young Hensen, MD;  Location: [facility] OR;  Service: Vascular;  Laterality: Bilateral;   GANGLION CYST EXCISION Right    "wrist"; Dr. Lorena Rolling   HOT HEMOSTASIS N/A 01/22/2018   Procedure: HOT HEMOSTASIS (ARGON PLASMA COAGULATION/BICAP);  Surgeon: Kenney Peacemaker, MD;  Location: [facility];  Service: Endoscopy;  Laterality: N/A;   LOOP RECORDER INSERTION N/A 04/25/2019   Procedure: LOOP RECORDER INSERTION;  Surgeon: Verona Goodwill, MD;  Location: [facility] INVASIVE CV LAB;  Service: Cardiovascular;  Laterality: N/A;   LOOP RECORDER REMOVAL  N/A 07/01/2019   Procedure: LOOP RECORDER REMOVAL;  Surgeon: Verona Goodwill, MD;  Location: [facility] INVASIVE CV LAB;  Service: Cardiovascular;  Laterality: N/A;   LUMBAR LAMINECTOMY  1972   Dr. Bobbye Burrow   PACEMAKER IMPLANT N/A 07/01/2019   Procedure: PACEMAKER IMPLANT;  Surgeon: Verona Goodwill, MD;  [facility] INVASIVE CV LAB;  Service: Cardiovascular;  Laterality: N/A;   PERIPHERAL VASCULAR BALLOON ANGIOPLASTY Left 10/24/2021   Procedure: PERIPHERAL VASCULAR BALLOON ANGIOPLASTY;  Surgeon: Young Hensen, MD;  Location: [facility];  Service: Cardiovascular;   Laterality: Left;   RIGHT/LEFT HEART CATH AND CORONARY ANGIOGRAPHY N/A 09/10/2016   Procedure: Right/Left Heart Cath and Coronary Angiography;  Surgeon: Odie Benne, MD;  Location: [facility] INVASIVE CV LAB;  Service: Cardiovascular;  Laterality: N/A;   TONSILLECTOMY       Social History:   reports that he quit smoking about 25 years ago. His smoking use included cigarettes, pipe, and cigars. He started smoking about 65 years ago. He has a 100 pack-year smoking history. He has never been exposed to tobacco smoke. He has never used smokeless tobacco. He reports that he does not drink alcohol  and does not use drugs.   Family History:  His family history includes Other in his mother. He was adopted.   Allergies No Known Allergies   Home Medications  Prior to Admission medications   Medication Sig Start Date End Date Taking? Authorizing Provider  acetaminophen  (TYLENOL ) 500 MG tablet Take 1,000 mg by mouth every 6 (six) hours as needed for mild pain.    [provider]  albuterol  (PROVENTIL ) (2.5 MG/3ML) 0.083% nebulizer solution Take 3 mLs (2.5 mg total) by nebulization every 6 (six) hours as needed for wheezing or shortness of breath. 10/25/20   Parrett, Macdonald Savoy, NP  albuterol  (VENTOLIN  HFA) 108 (90 Base) MCG/ACT inhaler INHALE 2 PUFFS INTO THE LUNGS EVERY 6 HOURS AS NEEDED FOR WHEEZING OR SHORTNESS OF BREATH 04/17/23   Parrett, Macdonald Savoy, NP  atorvastatin  (LIPITOR ) 40 MG tablet Take 1 tablet (40 mg total) by mouth daily. 10/21/16   Elmyra Haggard, MD  Carboxymethylcellul-Glycerin (LUBRICATING EYE DROPS OP) Place 1 drop into both eyes 4 (four) times daily as needed (dry eyes).     [provider]  carvedilol  (COREG ) 3.125 MG tablet TAKE 1 TABLET(3.125 MG) BY MOUTH TWICE DAILY WITH A MEAL 07/20/23   Adelia Homestead, MD  Cholecalciferol  (VITAMIN D3) 2000 units capsule Take 2,000 Units by mouth daily.  01/11/16   [provider]  clopidogrel  (PLAVIX ) 75 MG tablet TAKE  1 TABLET(75 MG) BY MOUTH DAILY 09/08/23   Young Hensen, MD  Continuous Blood Gluc Receiver (DEXCOM G7 RECEIVER) DEVI Use to check blood sugars 3x a day. DX CODE: E11.69, E78.5 08/20/22   Adelia Homestead, MD  Continuous Blood Gluc Sensor (DEXCOM G7 SENSOR) MISC Use to check blood sugars 3x a day. DX CODE: E11.69, E78.5 08/20/22   Adelia Homestead, MD  Continuous Blood Gluc Transmit (DEXCOM G6 TRANSMITTER) MISC by Does not apply route.    [provider]  cyanocobalamin  (VITAMIN B12) 500 MCG tablet TAKE ONE TABLET BY MOUTH DAILY FOR LOW B12 LEVEL 07/30/22   [provider]  empagliflozin (JARDIANCE) 10 MG TABS tablet Take 5 mg by mouth daily. Filled at [place].    [provider]  ferrous sulfate  324 (65 Fe) MG TBEC Take 1 tablet by mouth daily. 10/02/21   [provider]  fluticasone  (  FLONASE ) 50 MCG/ACT nasal spray Place 1 spray into both nostrils daily. Patient taking differently: Place 1 spray into both nostrils daily as needed for allergies. 07/17/21   Cobb, Mariah Shines, NP  imiquimod (ALDARA) 5 % cream APPLY THIN LAYER TO AFFECTED AREA THREE TIMES A WEEK APPLY TO WARTS 3-5 TIMES PER WEEK FOR 6 WEEKS OR UNTIL WARTS DISAPPEAR 12/08/22   [provider]  insulin  glargine (LANTUS ) 100 UNIT/ML injection Inject 30 Units into the skin at bedtime.    [provider]  leflunomide  (ARAVA ) 10 MG tablet TAKE ONE TABLET BY MOUTH DAILY FOR RHEUMATOID ARTHRITIS 08/01/22   [provider]  levothyroxine  (SYNTHROID ) 125 MCG tablet TAKE ONE TABLET BY MOUTH DAILY FOR THYROID  12/07/22   [provider]  losartan  (COZAAR ) 100 MG tablet Take 1 tablet (100 mg total) by mouth daily. 12/24/22   Lenise Quince, MD  metFORMIN  (GLUCOPHAGE ) 500 MG tablet Take 500-1,000 mg by mouth 2 (two) times daily with a meal. Take 1 tablet (500 mg) by mouth in the morning & take 2 tablets (1000 mg) by mouth in the evening.    [provider]  Multiple  Vitamins-Minerals (MEGA MULTIVITAMIN FOR MEN PO) Take 1 tablet by mouth daily.    [provider]  OXYGEN  Inhale 2 L into the lungs continuous.    [provider]  predniSONE  (DELTASONE ) 10 MG tablet Take 1 tablet (10 mg total) by mouth daily with breakfast. 12/01/23   Cameron Cea, MD  pregabalin  (LYRICA ) 75 MG capsule Take 1 capsule (75 mg total) by mouth 2 (two) times daily. 03/27/22   Gudena, Vinay, MD  Semaglutide  (OZEMPIC , 1 MG/DOSE, [place]) Inject 1 mg into the skin every Friday.    [provider]  sertraline  (ZOLOFT ) 100 MG tablet TAKE ONE-HALF TABLET BY MOUTH EVERY MORNING AFTER BREAKFAST FOR MENTAL HEALTH 11/06/22   [provider]  tamsulosin  (FLOMAX ) 0.4 MG CAPS capsule Take 1 capsule (0.4 mg total) by mouth at bedtime. 09/14/17   Jobe Mulder, DO  Tiotropium Bromide -Olodaterol (STIOLTO RESPIMAT ) 2.5-2.5 MCG/ACT AERS Inhale 2 puffs into the lungs daily. 03/01/21   Parrett, Macdonald Savoy, NP  traZODone (DESYREL) 50 MG tablet TAKE ONE TABLET BY MOUTH AT BEDTIME FOR INSOMNIA 11/06/22   [provider]     Critical care time:      Roz Cornelia, AGACNP-BC [facility]  See Amion for personal pager PCCM on call pager [phone] until 7pm. Please call Elink 7p-7a. [phone]  12/03/2023 6:49 PM

## 2023-12-03 NOTE — Assessment & Plan Note (Addendum)
Hold Plavix

## 2023-12-03 NOTE — Progress Notes (Signed)
 @Patient  ID: [name], male    DOB: [birth year], 83 y.o.   MRN: [id number]  Chief Complaint  Patient presents with   Follow-up    Coughing up blood- bright red started Monday evening, size of a "ping pong ball" or " 2 quarters"   Pain on left side of chest, progressed s.o.b even on O2    Referring provider: Adelia Homestead, *  HPI: 83 year old male former smoker followed for very severe COPD, chronic hypoxic respiratory failure on oxygen , sleep apnea on CPAP.  History of lung nodules followed on serial CT imaging Medical history significant for rheumatoid arthritis, diabetes, congestive heart failure, coronary artery disease, chronic kidney disease, atrial fibrillation with previous use of amiodarone -stopped in August 2019.  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 PFT 10/2015 FEV1 was 36%, ratio 46, FVC 58%, DLCO 26%    CT chest 12/2016 -masslike consolidation in LUL   PET scan 03/20/17 >+metabolic act in LUL consolidation    CT chest 08/2017 >> 2.4 x 3.5 cm triangular subpleural opacity in the anterior left upper lobe > has resolved the platelike scarring bilateral nodular infiltrates stable,   CT chest 09/2020 severe emphysema, extensive  pleural calcification.  Previous nodule in the right lower lobe has significantly decreased in size measuring 0.8 x 0.8 cm previously at 2.3 cm.  A stable left upper lobe cavitary nodule measuring 4 mm.  And a stable irregular nodule in the left lower lobe measuring 1.5 cm, and a left lower lobe nodule measuring 1.5 cm   CT chest October 22, 2021 that showed a stable uncomplicated aneurysmal dilatation along the aortic arch.  Interval increase of the abdominal aorta from 33 mm to 36 mm.,  Advanced emphysema.  Stable chronic extensive pleural calcifications, no worrisome pulmonary nodules.  Ill-defined left lower lobe pulmonary nodules unchanged since October 2020. Extensive cholelithiasis   CT chest 09/2022 Grossly stable 4.2 cm focal aneurysm seen involving aortic arch. Stable calcified pleural plaques and associated scarring are noted in right lung.Minimally displaced fractures are seen involving the left fourth, fifth, sixth and seventh ribs.  12/03/2023 Acute OV : Hemoptysis  Discussed the use of AI scribe software for clinical note transcription with the patient, who gave verbal consent to proceed. Patient presents for an acute office visit.  Patient was seen approxi-1 month ago for follow-up for COPD.  Was in stable condition.  He had had a fall approximately 1 month ago falling onto his left side.  Had some rib tenderness.  Over the last few weeks since his fall he has had worsening breathing and 3 days ago he started coughing up some blood-tinged mucus.  Has known lung nodules that is followed serially along with ascending aortic aneurysm.  Patient was set up for his annual CT angio chest aorta that was completed on November 17, 2023.  This showed interval development of focal pleural thickening in the left superior hemothorax laterally measuring 6.7 x 4.3 cm and circumferential pleural thickening with a nodular contour.  And a soft tissue attenuation adjacent to the left ventricle apex.  Measuring 2.7  cm.  Concern for possible underlying malignancy.  Patient was set up for a PET scan that is planned for Dec 08, 2023.  Patient was seen at oncology for routine follow-up on Dec 01, 2023 for ITP.  During that visit patient had increased shortness of breath and new development of hemoptysis and was referred to our office for work in visit.  Patient says has not had an appetite last 2 days, not eating. Coughing up large chunks of bright red blood this morning. No fever or syncope. Chest x-ray today shows new Left apical opacification concerning for lung mass.     No Known Allergies  Immunization History  Administered Date(s) Administered   Fluad Quad(high Dose 65+) 04/26/2019, 06/08/2020, 05/01/2021   Fluad Trivalent(High Dose 65+) 04/23/2023   Influenza Split 04/14/2011, 04/27/2013, 03/28/2014   Influenza Whole 05/28/2009, 04/27/2010, 03/31/2012   Influenza, High Dose Seasonal PF 04/10/2010, 04/11/2011, 04/19/2013, 03/21/2015, 04/05/2016, 05/16/2017, 03/28/2018, 04/08/2018   Influenza,inj,Quad PF,6+ Mos 03/17/2014, 04/04/2016   Influenza-Unspecified 04/30/2006, 04/28/2007, 04/28/2008, 05/29/2009, 04/10/2010, 05/26/2011, 05/07/2012, 05/28/2020, 05/01/2021, 04/27/2022   PFIZER Comirnaty(Gray Top)Covid-19 Tri-Sucrose Vaccine 11/29/2020, 05/01/2021   PFIZER(Purple Top)SARS-COV-2 Vaccination 08/19/2019, 09/09/2019, 03/15/2020, 04/08/2020, 12/09/2020   PNEUMOCOCCAL CONJUGATE-20 06/27/2022   Pfizer Covid-19 Vaccine Bivalent Booster 82yrs & up 05/01/2021   Pfizer(Comirnaty)Fall Seasonal Vaccine 12 years and older 05/03/2022, 04/23/2023   Pneumococcal Conjugate-13 09/20/2013, 03/29/2015   Pneumococcal Polysaccharide-23 05/28/2008, 04/25/2010, 04/19/2013   Pneumococcal-Unspecified 06/26/2006, 04/27/2013   Rsv, Bivalent, Protein Subunit Rsvpref,pf Pattricia Bores) 07/30/2022   Td 11/23/2009   Tdap 07/17/2011, 07/30/2022   Zoster, Live 04/28/2009, 11/08/2009    Past Medical History:  Diagnosis Date    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   ablated   Anxiety    Aortic aneurysm (HCC) 09/02/2017   BENIGN PROSTATIC HYPERTROPHY 11/23/2009   Cardiomyopathy (HCC) 08/28/2016   Chronic systolic CHF (congestive heart failure) (HCC) 09/02/2017   COPD (chronic obstructive pulmonary disease) (HCC)    CORONARY ARTERY DISEASE 11/23/2009   DECREASED HEARING, 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   DEGENERATIVE JOINT DISEASE 11/23/2009   DEPRESSION 11/23/2009   FATIGUE 11/23/2009   GAIT DISTURBANCE 12/10/2009   HEMOPTYSIS UNSPECIFIED 05/07/2010   High cholesterol    HYPERTENSION 07/30/2009   HYPOTHYROIDISM 07/30/2009   Ischemic cardiomyopathy 09/02/2017   LUMBAR RADICULOPATHY, 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   On home oxygen  therapy    "2-3L; 24/7" (09/10/2016)   OSA on CPAP    Pneumonia    PTSD (post-traumatic stress disorder) 03/10/2012   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   RA (rheumatoid arthritis) (HCC) 06/11/2011   "qwhere" (09/10/2016)   RESPIRATORY FAILURE, CHRONIC 07/31/2009   Scleritis of both eyes 03/17/2014   Thrombocytopenia (HCC)    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   Type II diabetes mellitus (HCC)     Tobacco History: Social History   Tobacco Use  Smoking Status Former   Current packs/day: 0.00   Average packs/day: 2.5 packs/day for 40.0 years (100.0 ttl pk-yrs)   Types: Cigarettes, Pipe, Cigars   Start date: 07/28/1958   Quit date: 07/28/1998   Years since quitting: 25.3   Passive exposure: Never  Smokeless Tobacco Never   Counseling given: Not Answered   Outpatient Medications Prior  to Visit  Medication Sig Dispense Refill   acetaminophen  (TYLENOL ) 500 MG tablet Take 1,000 mg by mouth every 6 (six) hours as needed for mild pain.     albuterol  (PROVENTIL ) (2.5 MG/3ML) 0.083% nebulizer solution Take 3 mLs (2.5 mg total) by nebulization every 6 (six) hours as needed for wheezing or shortness of breath. 120 mL 12   albuterol  (VENTOLIN  HFA) 108 (90 Base) MCG/ACT inhaler INHALE 2 PUFFS INTO THE LUNGS EVERY 6 HOURS AS NEEDED FOR WHEEZING OR SHORTNESS  OF BREATH 54 g 3   atorvastatin  (LIPITOR ) 40 MG tablet Take 1 tablet (40 mg total) by mouth daily. 90 tablet 3   Carboxymethylcellul-Glycerin (LUBRICATING EYE DROPS OP) Place 1 drop into both eyes 4 (four) times daily as needed (dry eyes).      carvedilol  (COREG ) 3.125 MG tablet TAKE 1 TABLET(3.125 MG) BY MOUTH TWICE DAILY WITH A MEAL 60 tablet 3   Cholecalciferol  (VITAMIN D3) 2000 units capsule Take 2,000 Units by mouth daily.      clopidogrel  (PLAVIX ) 75 MG tablet TAKE 1 TABLET(75 MG) BY MOUTH DAILY 30 tablet 11   Continuous Blood Gluc Receiver (DEXCOM G7 RECEIVER) DEVI Use to check blood sugars 3x a day. DX CODE: E11.69, E78.5 1 each 0   Continuous Blood Gluc Sensor (DEXCOM G7 SENSOR) MISC Use to check blood sugars 3x a day. DX CODE: E11.69, E78.5 3 each 4   Continuous Blood Gluc Transmit (DEXCOM G6 TRANSMITTER) MISC by Does not apply route.     cyanocobalamin  (VITAMIN B12) 500 MCG tablet TAKE ONE TABLET BY MOUTH DAILY FOR LOW B12 LEVEL     empagliflozin (JARDIANCE) 10 MG TABS tablet Take 5 mg by mouth daily. Filled at [facility].     ferrous sulfate  324 (65 Fe) MG TBEC Take 1 tablet by mouth daily.     fluticasone  (FLONASE ) 50 MCG/ACT nasal spray Place 1 spray into both nostrils daily. (Patient taking differently: Place 1 spray into both nostrils daily as needed for allergies.) 18.2 mL 2   imiquimod (ALDARA) 5 % cream APPLY THIN LAYER TO AFFECTED AREA THREE TIMES A WEEK APPLY TO WARTS 3-5 TIMES PER WEEK FOR 6 WEEKS OR UNTIL WARTS DISAPPEAR     insulin  glargine (LANTUS ) 100 UNIT/ML injection Inject 30 Units into the skin at bedtime.     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 90 tablet 3   metFORMIN  (GLUCOPHAGE ) 500 MG tablet Take 500-1,000 mg by mouth 2 (two) times daily with a meal. Take  1 tablet (500 mg) by mouth in the morning & take 2 tablets (1000 mg) by mouth in the evening.     Multiple Vitamins-Minerals (MEGA MULTIVITAMIN FOR MEN PO) Take 1 tablet by mouth daily.     OXYGEN  Inhale 2 L into the lungs continuous.     predniSONE  (DELTASONE ) 10 MG tablet Take 1 tablet (10 mg total) by mouth daily with breakfast. 90 tablet 3   pregabalin  (LYRICA ) 75 MG capsule Take 1 capsule (75 mg total) by mouth 2 (two) times daily.     Semaglutide  (OZEMPIC , 1 MG/DOSE, [place]) Inject 1 mg into the skin every Friday.     sertraline  (ZOLOFT ) 100 MG tablet TAKE ONE-HALF TABLET BY MOUTH EVERY MORNING AFTER BREAKFAST FOR MENTAL  HEALTH     tamsulosin  (FLOMAX ) 0.4 MG CAPS capsule Take 1 capsule (0.4 mg total) by mouth at bedtime. 30 capsule 0   Tiotropium Bromide -Olodaterol (STIOLTO RESPIMAT ) 2.5-2.5 MCG/ACT AERS Inhale 2 puffs into the lungs daily. 4 g 0   traZODone (DESYREL) 50 MG tablet TAKE ONE TABLET BY MOUTH AT BEDTIME FOR INSOMNIA     No facility-administered medications prior to visit.     Review of Systems:   Constitutional:   No  weight loss, night sweats,  Fevers, chills,+ fatigue, or  lassitude.  HEENT:   No headaches,  Difficulty swallowing,  Tooth/dental problems, or  Sore throat,                No sneezing, itching, ear ache, nasal congestion, post nasal drip,   CV:  No chest pain,  Orthopnea, PND, swelling in lower extremities, anasarca, dizziness, palpitations, syncope.   GI  No heartburn, indigestion, abdominal pain, nausea, vomiting, diarrhea, change in bowel habits, loss of appetite, bloody stools.   Resp: .  No chest wall deformity  Skin: no rash or lesions.  GU: no dysuria, change in color of urine, no urgency or frequency.  No flank pain, no hematuria   MS:  No joint pain or swelling.  No decreased range of motion.  No back pain.    Physical Exam  BP (!) 92/49 (BP Location: Right Arm, Patient Position: Sitting, Cuff Size: Normal)   Pulse (!) 57   Temp 98.1 F  (36.7 C) (Temporal)   Ht 6\' 2"  (1.88 m)   Wt 190 lb (86.2 kg)   SpO2 98%   BMI 24.39 kg/m   GEN: A/Ox3; pleasant , NAD, chronically ill appearing, On O2 , wc    HEENT:  [place]/AT,  NOSE-clear, THROAT-clear, no lesions, no postnasal drip or exudate noted.   NECK:  Supple w/ fair ROM; no JVD; normal carotid impulses w/o bruits; no thyromegaly or nodules palpated; no lymphadenopathy.    RESP  Decreased BS in left  no accessory muscle use, no dullness to percussion  CARD:  RRR, no m/r/g, min to trace peripheral edema, pulses intact, no cyanosis or clubbing.  GI:   Soft & nt; nml bowel sounds; no organomegaly or masses detected.   Musco: Warm bil, no deformities or joint swelling noted.   Neuro: alert, no focal deficits noted.    Skin: Warm, bruising left upper arm.     Lab Results:  CBC    Component Value Date/Time   WBC 10.9 (H) 12/01/2023 1337   WBC 8.7 11/14/2020 0945   RBC 4.39 12/01/2023 1337   HGB 12.4 (L) 12/01/2023 1337   HGB 12.0 (L) 06/28/2019 1453   HCT 37.8 (L) 12/01/2023 1337   HCT 38.4 06/28/2019 1453   PLT 150 12/01/2023 1337   PLT 44 (LL) 06/28/2019 1453   MCV 86.1 12/01/2023 1337   MCV 89 06/28/2019 1453   MCH 28.2 12/01/2023 1337   MCHC 32.8 12/01/2023 1337   RDW 15.0 12/01/2023 1337   RDW 13.8 06/28/2019 1453   LYMPHSABS 1.0 12/01/2023 1337   LYMPHSABS 1.2 12/23/2016 1053   MONOABS 0.7 12/01/2023 1337   EOSABS 0.1 12/01/2023 1337   EOSABS 0.3 12/23/2016 1053   BASOSABS 0.0 12/01/2023 1337   BASOSABS 0.0 12/23/2016 1053    BMET    Component Value Date/Time   NA 140 01/02/2023 0837   K 4.7 01/02/2023 0837   CL 104 01/02/2023 0837   CO2 23 01/02/2023 0837  GLUCOSE 120 (H) 01/02/2023 0837   GLUCOSE 89 03/13/2022 0736   BUN 27 01/02/2023 0837   CREATININE 1.70 (H) 11/17/2023 1538   CREATININE 1.17 04/13/2020 0915   CALCIUM  9.0 01/02/2023 0837   GFRNONAA >60 08/26/2020 0314   GFRNONAA >60 07/15/2018 1059   GFRAA 58 (L) 02/22/2020 1029    GFRAA >60 07/15/2018 1059    BNP No results found for: "BNP"  ProBNP    Component Value Date/Time   PROBNP 188 10/20/2016 1038   PROBNP 59.9 05/04/2011 0517    Imaging: DG Chest 2 View Result Date: 12/03/2023 CLINICAL DATA:  Hemoptysis. EXAM: CHEST - 2 VIEW COMPARISON:  Chest radiograph dated 09/19/2022. chest CT dated 11/17/2023. FINDINGS: There is opacification of the left apex concerning for a mass/malignancy. Background of emphysema. Right lung pleural parenchyma calcification. No pneumothorax. The cardiac silhouette is within normal limits. Left pectoral pacemaker device. No acute osseous pathology. IMPRESSION: Opacification of the left apex concerning for a mass/malignancy. Electronically Signed   By: Angus Bark M.D.   On: 12/03/2023 13:46   CT ANGIO CHEST AORTA W/CM & OR WO/CM Result Date: 11/22/2023 CLINICAL DATA:  Aneurysm of ascending aorta EXAM: CT ANGIOGRAPHY CHEST WITH CONTRAST TECHNIQUE: Multidetector CT imaging of the chest was performed using the standard protocol during bolus administration of intravenous contrast. Multiplanar CT image reconstructions and MIPs were obtained to evaluate the vascular anatomy. Multiplanar image (3D post-processing) reconstructions and MIPs were obtained to evaluate the vascular anatomy. RADIATION DOSE REDUCTION: This exam was performed according to the departmental dose-optimization program which includes automated exposure control, adjustment of the mA and/or kV according to patient size and/or use of iterative reconstruction technique. CONTRAST:  80mL OMNIPAQUE  IOHEXOL  350 MG/ML SOLN COMPARISON:  CT of the chest performed October 16, 2022 FINDINGS: Cardiovascular: Heart size is within normal limits. No pericardial effusion. Sinus of Valsalva: 36 x 32 x 37 mm Sinotubular junction: 28 x 33 mm Tubular ascending thoracic aorta: 33 x 32 mm Distal aortic arch: 41 mm 39 mm Proximal descending thoracic aorta: 32 mm x 29 mm Descending thoracic aorta at  the level of the aortic hiatus: 30 mm x 28 mm Main pulmonary artery: The main pulmonary artery is mildly dilated at 38 mm which may represent sequelae of pulmonary arterial hypertension. Mediastinum/Nodes: No evidence of mediastinal lymphadenopathy. Lungs/Pleura: There has been interval development of a focus of pleural thickening in the left superior hemithorax laterally which measures approximately 6.7 x 4.3 cm on axial imaging and was not present on the previous examination. This is intermediate in density. There is also circumferential pleural thickening at this site which is somewhat nodular in contour. Pleural thickening extends the level of the mid left hemithorax. Also noted is a focus of soft tissue attenuation adjacent to the left ventricular apex which is favored to be external from the pericardium. This measures approximately 2.7 x 0.9 cm on image 95 of series 301. On the right, there are calcified pleural plaques which are grossly similar when compared to the previous exam. Extensive emphysematous changes are present which are broadly speaking similar when compared to the previous examination. Upper Abdomen: Gallstones. Known visceral aneurysms are incompletely captured. Musculoskeletal: Degenerative changes in the thoracic spine, similar. Chronic appearing rib fractures. Review of the MIP images confirms the above findings. IMPRESSION: 1. Interval development of abnormal pleural thickening in the left hemithorax with additional areas of soft tissue abnormality. Differential considerations include malignant etiologies. Recommend further evaluation by oncology or pulmonology for definitive characterization.  2. 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 3. Recommend semi-annual imaging followup by CTA or MRA and referral to cardiothoracic surgery if not already obtained. This recommendation follows 2010 ACCF/AHA/AATS/ACR/ASA/SCA/SCAI/SIR/STS/SVM  Guidelines for the Diagnosis and Management of Patients With Thoracic Aortic Disease. Circulation. 2010; 121: N562-Z30. Aortic aneurysm NOS (ICD10-I71.9) 4. Dilation of the main pulmonary artery which can be observed in the setting of pulmonary arterial hypertension. 5. Extensive emphysema. These results will be called to the ordering clinician or representative by the Radiologist Assistant, and communication documented in the PACS or [employer]. Electronically Signed   By: Reagan Camera M.D.   On: 11/22/2023 13:24    Administration History     None          Latest Ref Rng & Units 11/16/2015    9:55 AM 12/30/2013    9:45 AM  PFT Results  FVC-Pre L 2.96  3.01   FVC-Predicted Pre % 59  59   FVC-Post L 2.89  3.01   FVC-Predicted Post % 58  59   Pre FEV1/FVC % % 43  45   Post FEV1/FCV % % 46  49   FEV1-Pre L 1.28  1.36   FEV1-Predicted Pre % 35  36   FEV1-Post L 1.33  1.48   DLCO uncorrected ml/min/mmHg 9.88  10.13   DLCO UNC% % 26  26   DLCO corrected ml/min/mmHg 9.94    DLCO COR %Predicted % 26    DLVA Predicted % 40  39   TLC L  6.31   TLC % Predicted %  80   RV % Predicted %  80     No results found for: "NITRICOXIDE"      Assessment & Plan:   Hemoptysis New onset hemoptysis x 3 days with associated large left apical consolidation concerning for lung mass, Infection vs hemothorax. Fall ~4-5 weeks ago, progressive respiratory symptoms with increased shortness of breath, weakness and anorexia. CT chest showed large soft tissues mass /pleural thickening - PET scan is pending for next week.  Patient high risk for decompensation, needs hospitalization for further evaluation  Unable for direct admission , sent to ER for admission through hospitalist group.  Will need pulmonary consult -Dr. Villa Greaser patient. PCCM aware and will await consult request.  [facility] ER triage contacted that patient is en route via private vehicle. Patient daughter to take patient directly, declined EMS.    COPD -continue current regimen   CHF - continue current regimen   OSA -continue on CPAP At bedtime    O2 RF -continue on O2.    I spent 41   minutes dedicated to the care of this patient on the date of this encounter to include pre-visit review of records, face-to-face time with the patient discussing conditions above, post visit ordering of testing, clinical documentation with the electronic health record, making appropriate referrals as documented, and communicating necessary findings to members of the patients care team.    Roena Clark, NP 12/03/2023

## 2023-12-03 NOTE — Assessment & Plan Note (Signed)
 -  Order Sensitive  SSI   - continue home insulin but decreased to   10units,  -  check TSH and HgA1C  - Hold by mouth medications
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
## 2023-12-03 NOTE — Assessment & Plan Note (Signed)
 As per pulmonology

## 2023-12-03 NOTE — Assessment & Plan Note (Signed)
 Continue chronic steroids prednisone  10 mg poq day

## 2023-12-03 NOTE — Assessment & Plan Note (Signed)
 Chronic-stable.

## 2023-12-03 NOTE — Assessment & Plan Note (Signed)
 Appreciate pulmonology consult May need thoracentesis

## 2023-12-03 NOTE — Patient Instructions (Signed)
 Go to ER at [facility] to be admitted to the hospital.

## 2023-12-03 NOTE — Assessment & Plan Note (Signed)
 When BP improved will restart COreg   Still holding eliquis 

## 2023-12-03 NOTE — Assessment & Plan Note (Signed)
 Appreciate pulmonology input now under good control with Tranexamic acid

## 2023-12-03 NOTE — Subjective & Objective (Signed)
 Has severe COPD presetns with hemoptysis,  Had hemoptysis started 2 days ago at first with phlegm now coughing up blood clots Seen by Pulmonology who sent him to ER Seen him in ER chronic O2 requirement  CT in April showed possible malignacy  Not on eliquis  due to ITP   He is on Plavix  due to CAD

## 2023-12-03 NOTE — ED Provider Triage Note (Signed)
 Emergency Medicine Provider Triage Evaluation Note  [name] , a 83 y.o. male  was evaluated in triage.  Pt complains of hemoptysis 3 d 2x day, approx 1 T each time, pulm sent in. Abnormal x-ray.  Review of Systems  Positive: CP, SOB, coguh Negative: Fever  Physical Exam  BP (!) 113/48 (BP Location: Left Arm)   Pulse 67   Temp 98.5 F (36.9 C) (Oral)   Resp 20   Ht 6\' 2"  (1.88 m)   Wt 86.2 kg   SpO2 95%   BMI 24.39 kg/m  Gen:   Awake, no distress   Resp:  Normal effort  MSK:   Moves extremities without difficulty  Other:    Medical Decision Making  Medically screening exam initiated at 3:54 PM.  Appropriate orders placed.  [name] was informed that the remainder of the evaluation will be completed by another provider, this initial triage assessment does not replace that evaluation, and the importance of remaining in the ED until their evaluation is complete.     Eudora Heron, PA-C 12/03/23 1555

## 2023-12-03 NOTE — Assessment & Plan Note (Signed)
-  will monitor on tele avoid QT prolonging medications, rehydrate correct electrolytes ? ?

## 2023-12-03 NOTE — Assessment & Plan Note (Addendum)
 Patient reports decreased p.o. intake We will gently rehydrate and follow fluid status Obtain urine electrolytes

## 2023-12-03 NOTE — H&P (Signed)
 [name]:[id number] DOB: 03/27/1941 DOA: 12/03/2023     PCP: Adelia Homestead, MD   Outpatient Specialists:  CARDS:  Dr. Alexandria Angel, MD   Pulmonary    [facility]  Oncology   Dr. Lee Public    Patient arrived to ER on 12/03/23 at 1451 Referred by Attending Scarlette Currier, MD   Patient coming from:    home Lives alone,    Chief Complaint:   Chief Complaint  Patient presents with   Cough    HPI: [name] is a 83 y.o. male with medical history significant of COPD on 3L of O2, DM2, OSA on CPAP, rheumatoid arthritis lung nodules CHF CAD CKD A-fib ITP chronic steroids, left common femoral to popliteal bypass in January 2022, pacemaker, status post autologous stem cell transplant for COPD in 2017, ascending aortic aneurysm    Presented with    hemoptysis Has severe COPD presetns with hemoptysis,  Had hemoptysis started 2 days ago at first with phlegm now coughing up blood clots Seen by Pulmonology who sent him to ER Seen him in ER chronic O2 requirement  CT in April showed possible malignacy  Not on eliquis  due to ITP   He is on Plavix  due to CAD    his annual CT angio chest aorta that was completed on November 17, 2023. This showed interval development of focal pleural thickening in the left superior hemothorax laterally measuring 6.7 x 4.3 cm and circumferential pleural thickening with a nodular contour. And a soft tissue attenuation adjacent to the left ventricle apex. Measuring 2.7 cm. Concern for possible underlying malignancy.  He was supposed to get a PET scan done in Dec 09, 2023 On arrival satting 89% 3 L started on 4 L  No fever or chills  Reports has not eating for the past 2 days Significant weight loss  Denies significant ETOH intake   Does not smoke       Regarding pertinent Chronic problems:    Hyperlipidemia -  on statins Lipitor  (atorvastatin )  Lipid Panel     Component Value Date/Time   CHOL 115 01/02/2023 0837   TRIG 111  01/02/2023 0837   HDL 34 (L) 01/02/2023 0837   CHOLHDL 3.4 01/02/2023 0837   CHOLHDL 4.0 08/23/2020 0350   VLDL 23 08/23/2020 0350   LDLCALC 60 01/02/2023 0837   LDLDIRECT 106.0 10/02/2016 1045   LABVLDL 21 01/02/2023 0837     HTN on Coreg  cozaar    chronic CHF diastolic  - last echo  Recent Results (from the past 01027 hours)  ECHOCARDIOGRAM COMPLETE   Collection Time: 03/23/19  2:24 PM   Narrative     ECHOCARDIOGRAM REPORT       Patient Name:   [name] Date of Exam: 03/23/2019 Medical Rec #:  [id number]          Height:       74.5 in Accession #:    [id number]         Weight:       215.0 lb Date of Birth:  10/17/1940          BSA:          2.25 m Patient Age:    78 years           BP:           130/66 mmHg Patient Gender: M  HR:           71 bpm. Exam Location:  [place]    Procedure: 2D Echo  Indications:    Syncope   History:        Patient has prior history of Echocardiogram examinations, most                 recent 09/02/2017. CHF CAD COPD Risk Factors: Hypertension,                 Diabetes and Dyslipidemia.   Sonographer:    Libby Ree RDCS (AE) Referring Phys: [id number] BRIAN S CRENSHAW  IMPRESSIONS    1. The left ventricle has normal systolic function with an ejection fraction of 60-65%. The cavity size was normal. There is moderately increased left ventricular wall thickness. Left ventricular diastolic Doppler parameters are indeterminate. There is  abnormal septal motion consistent with left bundle branch block.  2. The right ventricle has normal systolic function. The cavity was normal. There is no increase in right ventricular wall thickness.  3. The aorta is normal unless otherwise noted.  4. The inferior vena cava was normal in size with <50% respiratory variability.        CAD  - On  statin, betablocker, Plavix                  -  followed by cardiology                   DM 2 -  Lab Results  Component Value Date   HGBA1C  10.3 (H) 08/21/2020   on insulin     Hypothyroidism:   Lab Results  Component Value Date   TSH 2.21 04/13/2020   on synthroid       COPD - followed by pulmonology    on baseline oxygen   3 L,      OSA -on nocturnal oxygen ,  CPAP,       A. Fib -   atrial fibrillation CHA2DS2 vas score   6     Not on anticoagulation secondary toITP        -  Rate control:  Currently controlled with Coreg           - Rhythm control: off  amiodarone ,    CKD stage IIIb  baseline Cr 1.7 Estimated Creatinine Clearance: 31.2 mL/min (A) (by C-G formula based on SCr of 2.12 mg/dL (H)).  Lab Results  Component Value Date   CREATININE 2.12 (H) 12/03/2023   CREATININE 1.70 (H) 11/17/2023   CREATININE 1.19 01/02/2023   Lab Results  Component Value Date   NA 134 (L) 12/03/2023   CL 98 12/03/2023   K 4.5 12/03/2023   CO2 22 12/03/2023   BUN 52 (H) 12/03/2023   CREATININE 2.12 (H) 12/03/2023   GFRNONAA 30 (L) 12/03/2023   CALCIUM  8.5 (L) 12/03/2023   PHOS 3.8 02/18/2018   ALBUMIN  3.5 12/03/2023   GLUCOSE 152 (H) 12/03/2023    Hepatic Function Panel     Component Value Date/Time   PROT 6.6 12/03/2023 1755   PROT 6.2 01/02/2023 0837   ALBUMIN  3.5 12/03/2023 1755   ALBUMIN  4.1 01/02/2023 0837   AST 24 12/03/2023 1755   AST 22 07/15/2018 1059   ALT 16 12/03/2023 1755   ALT 15 07/15/2018 1059   ALKPHOS 46 12/03/2023 1755   BILITOT 1.1 12/03/2023 1755   BILITOT 0.5 01/02/2023 0837   BILITOT 0.6 07/15/2018 1059   BILIDIR 0.3 08/31/2017 2055  BILIDIR 0.27 12/23/2016 1053   IBILI 0.4 08/31/2017 2055   No results found for requested labs within last 1095 days.       Chronic anemia - baseline hg Hemoglobin & Hematocrit  Recent Labs    10/06/23 1442 12/01/23 1337 12/03/23 1607  HGB 13.1 12.4* 11.4*   Iron /TIBC/Ferritin/ %Sat    Component Value Date/Time   IRON  49 07/15/2018 1101   TIBC 311 07/15/2018 1101   FERRITIN 170 07/15/2018 1101   IRONPCTSAT 16 (L) 07/15/2018 1101    While  in ER:    Seen by pulmonology Initiated TXA nebs x 3 Imaging concerning for malignancy Cannot rule out hemothorax may need thoracentesis tomorrow    Lab Orders         Resp panel by RT-PCR (RSV, Flu A&B, Covid) Anterior Nasal Swab         CBC         Brain natriuretic peptide         Comprehensive metabolic panel with GFR        CXR - Opacification of the left apex concerning for a mass/malignancy.      CTA chest -  Enlarging soft tissue density in the left apex which has central decreased attenuation but some peripheral nodular areas of enhancement. The significant enlargement over the past 2 weeks suggests that this likely represents an enlarging effusion/hemothorax. The possibility of neoplasm however is not totally excluded.  No pE  Following Medications were ordered in ER: Medications  albuterol  (PROVENTIL ) (2.5 MG/3ML) 0.083% nebulizer solution (0 mg/hr Nebulization Stopped 12/03/23 1924)  tranexamic acid  (CYKLOKAPRON ) 1000 MG/10ML nebulizer solution 500 mg (500 mg Nebulization Given 12/03/23 2032)  umeclidinium-vilanterol (ANORO ELLIPTA ) 62.5-25 MCG/ACT 1 puff (has no administration in time range)  ipratropium (ATROVENT ) nebulizer solution 0.5 mg (0.5 mg Nebulization Given 12/03/23 1751)  methylPREDNISolone  sodium succinate (SOLU-MEDROL ) 125 mg/2 mL injection 125 mg (125 mg Intravenous Given 12/03/23 1701)  lactated ringers  bolus 1,000 mL (0 mLs Intravenous Stopped 12/03/23 2121)  iohexol  (OMNIPAQUE ) 350 MG/ML injection 60 mL (60 mLs Intravenous Contrast Given 12/03/23 2015)    _______________________________________________________ ER Provider Called:   PCCM Lemmie Pyo, NP  They Recommend admit to medicine    SEEN in ER     ED Triage Vitals  Encounter Vitals Group     BP 12/03/23 1511 (!) 113/48     Systolic BP Percentile --      Diastolic BP Percentile --      Pulse Rate 12/03/23 1511 67     Resp 12/03/23 1511 20     Temp 12/03/23 1511 98.5 F (36.9 C)      Temp Source 12/03/23 1511 Oral     SpO2 12/03/23 1511 95 %     Weight 12/03/23 1515 190 lb (86.2 kg)     Height 12/03/23 1515 6\' 2"  (1.88 m)     Head Circumference --      Peak Flow --      Pain Score 12/03/23 1515 6     Pain Loc --      Pain Education --      Exclude from Growth Chart --   HQIO(96)@     _________________________________________ Significant initial  Findings: Abnormal Labs Reviewed  CBC - Abnormal; Notable for the following components:      Result Value   WBC 11.3 (*)    RBC 4.04 (*)    Hemoglobin 11.4 (*)    HCT 36.7 (*)    All  other components within normal limits  COMPREHENSIVE METABOLIC PANEL WITH GFR - Abnormal; Notable for the following components:   Sodium 134 (*)    Glucose, Bld 152 (*)    BUN 52 (*)    Creatinine, Ser 2.12 (*)    Calcium  8.5 (*)    GFR, Estimated 30 (*)    All other components within normal limits  TROPONIN I (HIGH SENSITIVITY) - Abnormal; Notable for the following components:   Troponin I (High Sensitivity) 33 (*)    All other components within normal limits  TROPONIN I (HIGH SENSITIVITY) - Abnormal; Notable for the following components:   Troponin I (High Sensitivity) 33 (*)    All other components within normal limits      _________________________ Troponin   Cardiac Panel (last 3 results) Recent Labs    12/03/23 1607 12/03/23 1755  TROPONINIHS 33* 33*     ECG: Ordered Personally reviewed and interpreted by me showing: HR : 75 Rhythm: Atrial fibrillation IVCD, consider atypical RBBB LVH with secondary repolarization abnormality Baseline wander in lead(s) V5 QTC 540  BNP (last 3 results) Recent Labs    12/03/23 1607  BNP 68.4       The recent clinical data is shown below. Vitals:   12/03/23 1730 12/03/23 1739 12/03/23 1845 12/03/23 1923  BP:    135/76  Pulse: 68   79  Resp: (!) 21   19  Temp:   98.4 F (36.9 C)   TempSrc:   Oral   SpO2: 100% 100%  94%  Weight:      Height:          WBC      Component Value Date/Time   WBC 11.3 (H) 12/03/2023 1607   LYMPHSABS 1.0 12/01/2023 1337   LYMPHSABS 1.2 12/23/2016 1053   MONOABS 0.7 12/01/2023 1337   EOSABS 0.1 12/01/2023 1337   EOSABS 0.3 12/23/2016 1053   BASOSABS 0.0 12/01/2023 1337   BASOSABS 0.0 12/23/2016 1053     Procalcitonin   Ordered      UA  ordered    Results for orders placed or performed during the hospital encounter of 12/03/23  Resp panel by RT-PCR (RSV, Flu A&B, Covid) Anterior Nasal Swab     Status: None   Collection Time: 12/03/23  4:07 PM   Specimen: Anterior Nasal Swab  Result Value Ref Range Status   SARS Coronavirus 2 by RT PCR NEGATIVE NEGATIVE Final         Influenza A by PCR NEGATIVE NEGATIVE Final   Influenza B by PCR NEGATIVE NEGATIVE Final         Resp Syncytial Virus by PCR NEGATIVE NEGATIVE Final         *Note: Due to a large number of results and/or encounters for the requested time period, some results have not been displayed. A complete set of results can be found in Results Review.      __________________________________________________________ Recent Labs  Lab 12/03/23 1755  NA 134*  K 4.5  CO2 22  GLUCOSE 152*  BUN 52*  CREATININE 2.12*  CALCIUM  8.5*    Cr     Up from baseline see below Lab Results  Component Value Date   CREATININE 2.12 (H) 12/03/2023   CREATININE 1.70 (H) 11/17/2023   CREATININE 1.19 01/02/2023    Recent Labs  Lab 12/03/23 1755  AST 24  ALT 16  ALKPHOS 46  BILITOT 1.1  PROT 6.6  ALBUMIN  3.5   Lab Results  Component Value Date   CALCIUM  8.5 (L) 12/03/2023   PHOS 3.8 02/18/2018    Plt: Lab Results  Component Value Date   PLT 150 12/03/2023    Recent Labs  Lab 12/01/23 1337 12/03/23 1607  WBC 10.9* 11.3*  NEUTROABS 8.9*  --   HGB 12.4* 11.4*  HCT 37.8* 36.7*  MCV 86.1 90.8  PLT 150 150    HG/HCT stable,       Component Value Date/Time   HGB 11.4 (L) 12/03/2023 1607   HGB 12.4 (L) 12/01/2023 1337   HGB 12.0 (L)  06/28/2019 1453   HCT 36.7 (L) 12/03/2023 1607   HCT 38.4 06/28/2019 1453   MCV 90.8 12/03/2023 1607   MCV 89 06/28/2019 1453    _______________________________________________ Hospitalist was called for admission for hemoptysis   The following Work up has been ordered so far:  Orders Placed This Encounter  Procedures   Resp panel by RT-PCR (RSV, Flu A&B, Covid) Anterior Nasal Swab   DG Chest Port 1 View   CT Angio Chest PE W and/or Wo Contrast   CBC   Brain natriuretic peptide   Comprehensive metabolic panel with GFR   ED Cardiac monitoring   Check Peak Flow   Initiate Carrier Fluid Protocol   Consult to pulmonology   Consult to hospitalist   ED Pulse oximetry, continuous   CPAP   ED EKG   EKG 12-Lead   EKG 12-Lead   Saline lock IV     OTHER Significant initial  Findings:  labs showing:     DM  labs:  HbA1C: No results for input(s): "HGBA1C" in the last 8760 hours.     CBG (last 3)  No results for input(s): "GLUCAP" in the last 72 hours.        Cultures:    Component Value Date/Time   SDES  08/31/2017 2145    URINE, RANDOM Performed at [facility], [address] Johnella Naas [place], [place] [address]    [facility]  08/31/2017 2145    NONE Performed at [facility], [address] [place], [place] [address]    CULT 50,000 COLONIES/mL ENTEROCOCCUS FAECALIS (A) 08/31/2017 2145   REPTSTATUS 09/03/2017 FINAL 08/31/2017 2145     Radiological Exams on Admission: CT Angio Chest PE W and/or Wo Contrast Result Date: 12/03/2023 CLINICAL DATA:  Hemoptysis for several days, initial encounter EXAM: CT ANGIOGRAPHY CHEST WITH CONTRAST TECHNIQUE: Multidetector CT imaging of the chest was performed using the standard protocol during bolus administration of intravenous contrast. Multiplanar CT image reconstructions and MIPs were obtained to evaluate the vascular anatomy. RADIATION DOSE REDUCTION: This exam was performed according to the departmental  dose-optimization program which includes automated exposure control, adjustment of the mA and/or kV according to patient size and/or use of iterative reconstruction technique. CONTRAST:  60mL OMNIPAQUE  IOHEXOL  350 MG/ML SOLN COMPARISON:  Chest x-ray from earlier in the same day. CT from 10/16/2022 and 11/17/2023 FINDINGS: Cardiovascular: Atherosclerotic calcifications of the thoracic aorta and its branches are noted. Stable saccular aneurysmal dilatation of the distal thoracic aortic arch is noted extending inferiorly. This measures approximately 4.1 cm in greatest dimension. Descending thoracic aorta shows no aneurysmal dilatation or dissection. The pulmonary artery shows a normal branching pattern bilaterally. No intraluminal filling defect to suggest pulmonary embolism is noted. Mediastinum/Nodes: Thoracic inlet is within normal limits. The esophagus as visualized is unremarkable. Scattered small mediastinal nodes are seen. Lungs/Pleura: Calcified pleural plaques are again seen on the right.  Emphysematous changes and areas of scarring are noted within the right lung. Small dependent effusion is noted in the left hemithorax. There is been significant increase in the soft tissue density along the left pleural margin measuring up to 10.3 x 9.5 cm on today's exam. It previously measured 6.7 x 4.3 cm. The nodular areas of enhancement peripherally are again seen. This masslike density displaces the adjacent left upper lobe. Considerable emphysematous changes are seen. Upper Abdomen: Visualized upper abdomen demonstrates multiple gallstones within the gallbladder without complicating factors. Musculoskeletal: Old rib fractures are seen on the left and stable Review of the MIP images confirms the above findings. IMPRESSION: Enlarging soft tissue density in the left apex which has central decreased attenuation but some peripheral nodular areas of enhancement. The significant enlargement over the past 2 weeks suggests that  this likely represents an enlarging effusion/hemothorax. The possibility of neoplasm however is not totally excluded. Further workup is recommended. Chronic calcified pleural plaquing on the right. Cholelithiasis without complicating factors. 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 Continued follow-up as previously described No evidence of pulmonary embolism. Aortic Atherosclerosis (ICD10-I70.0). Electronically Signed   By: Violeta Grey M.D.   On: 12/03/2023 21:05   DG Chest Port 1 View Result Date: 12/03/2023 CLINICAL DATA:  Cough, shortness of breath. EXAM: PORTABLE CHEST 1 VIEW COMPARISON:  Same day. FINDINGS: Stable cardiomediastinal silhouette. Left-sided pacemaker is unchanged. Calcified pleural plaques are noted in right lung consistent with asbestos exposure. Stable large masslike density is noted in left upper lobe concerning for neoplasm or malignancy. Minimal bibasilar subsegmental atelectasis is noted. Bony thorax is unremarkable. IMPRESSION: Stable large masslike density is noted and left lung apex highly concerning for neoplasm or malignancy. Calcified pleural plaques are noted in right lung concerning for asbestos exposure. Minimal bibasilar subsegmental atelectasis. Electronically Signed   By: Rosalene Colon M.D.   On: 12/03/2023 16:34   DG Chest 2 View Result Date: 12/03/2023 CLINICAL DATA:  Hemoptysis. EXAM: CHEST - 2 VIEW COMPARISON:  Chest radiograph dated 09/19/2022. chest CT dated 11/17/2023. FINDINGS: There is opacification of the left apex concerning for a mass/malignancy. Background of emphysema. Right lung pleural parenchyma calcification. No pneumothorax. The cardiac silhouette is within normal limits. Left pectoral pacemaker device. No acute osseous pathology. IMPRESSION: Opacification of the left apex concerning for a mass/malignancy. Electronically Signed   By: Angus Bark M.D.   On: 12/03/2023 13:46    _______________________________________________________________________________________________________ Latest  Blood pressure 135/76, pulse 79, temperature 98.4 F (36.9 C), temperature source Oral, resp. rate 19, height 6\' 2"  (1.88 m), weight 86.2 kg, SpO2 94%.   Vitals  labs and radiology finding personally reviewed  Review of Systems:    Pertinent dyspnea on exertion, positives include:  fatigue, coughing up of blood Constitutional:  No weight loss, night sweats, Fevers, chills,  weight loss  HEENT:  No headaches, Difficulty swallowing,Tooth/dental problems,Sore throat,  No sneezing, itching, ear ache, nasal congestion, post nasal drip,  Cardio-vascular:  No chest pain, Orthopnea, PND, anasarca, dizziness, palpitations.no Bilateral lower extremity swelling  GI:  No heartburn, indigestion, abdominal pain, nausea, vomiting, diarrhea, change in bowel habits, loss of appetite, melena, blood in stool, hematemesis Resp:  no shortness of breath at rest. No  No excess mucus, no productive cough, No non-productive cough, No .No change in color of mucus.No wheezing. Skin:  no rash or lesions. No jaundice GU:  no dysuria, change in color of urine, no urgency or frequency. No straining to urinate.  No flank pain.  Musculoskeletal:  No joint pain or no joint swelling. No decreased range of motion. No back pain.  Psych:  No change in mood or affect. No depression or anxiety. No memory loss.  Neuro: no localizing neurological complaints, no tingling, no weakness, no double vision, no gait abnormality, no slurred speech, no confusion  All systems reviewed and apart from HOPI all are negative _______________________________________________________________________________________________ Past Medical History:   Past Medical History:  Diagnosis Date   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   ablated   Anxiety    Aortic aneurysm (HCC) 09/02/2017   BENIGN PROSTATIC HYPERTROPHY 11/23/2009    Cardiomyopathy (HCC) 08/28/2016   Chronic systolic CHF (congestive heart failure) (HCC) 09/02/2017   COPD (chronic obstructive pulmonary disease) (HCC)    CORONARY ARTERY DISEASE 11/23/2009   DECREASED HEARING, 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   DEGENERATIVE JOINT DISEASE 11/23/2009   DEPRESSION 11/23/2009   FATIGUE 11/23/2009   GAIT DISTURBANCE 12/10/2009   HEMOPTYSIS UNSPECIFIED 05/07/2010   High cholesterol    HYPERTENSION 07/30/2009   HYPOTHYROIDISM 07/30/2009   Ischemic cardiomyopathy 09/02/2017   LUMBAR RADICULOPATHY, 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   On home oxygen  therapy    "2-3L; 24/7" (09/10/2016)   OSA on CPAP    Pneumonia    PTSD (post-traumatic stress disorder) 03/10/2012   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   RA (rheumatoid arthritis) (HCC) 06/11/2011   "qwhere" (09/10/2016)   RESPIRATORY FAILURE, CHRONIC 07/31/2009   Scleritis of both eyes 03/17/2014   Thrombocytopenia (HCC)    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   Type II diabetes mellitus (HCC)     Past Surgical History:  Procedure Laterality Date   ABDOMINAL AORTIC ANEURYSM REPAIR  07/2002   Maximo Spar 12/10/2010   ABDOMINAL AORTOGRAM W/LOWER EXTREMITY N/A 08/16/2020   Procedure: ABDOMINAL AORTOGRAM W/LOWER EXTREMITY;  Surgeon: Young Hensen, MD;  Location: [facility];  Service: Cardiovascular;  Laterality: N/A;   ABDOMINAL AORTOGRAM W/LOWER EXTREMITY N/A 10/24/2021   Procedure: ABDOMINAL AORTOGRAM W/LOWER EXTREMITY;  Surgeon: Young Hensen, MD;  Location: [facility];  Service: Cardiovascular;  Laterality: N/A;   ABDOMINAL AORTOGRAM W/LOWER EXTREMITY N/A 03/13/2022   Procedure: ABDOMINAL AORTOGRAM W/LOWER EXTREMITY;  Surgeon: Young Hensen, MD;  Location: [facility];  Service: Cardiovascular;  Laterality: N/A;   ABDOMINAL EXPLORATION SURGERY  02/2004   w/LOA/notes 12/10/2010; small bowel obstruction repair with adhesiolysis    BACK SURGERY     CARDIAC CATHETERIZATION     2 heart caths in the past.  One in 2000s showed one ulcerated plaque  Rx  medically; Second at [facility] /notes 09/05/2016   CATARACT EXTRACTION W/ INTRAOCULAR LENS  IMPLANT, BILATERAL Bilateral 2000s   COLECTOMY     hx of remote ileum resection due to bleeding   COLONOSCOPY WITH PROPOFOL  N/A 01/22/2018   Procedure: COLONOSCOPY WITH PROPOFOL ;  Surgeon: Kenney Peacemaker, MD;  Location: [facility];  Service: Endoscopy;  Laterality: N/A;   CORONARY ANGIOPLASTY WITH STENT PLACEMENT  09/10/2016   CORONARY STENT INTERVENTION N/A 09/10/2016   Procedure: Coronary Stent Intervention;  Surgeon: Odie Benne, MD;  Location: [facility];  Service: Cardiovascular;  Laterality: N/A;  Distal RCA 4.0x16 Synergy   ESOPHAGOGASTRODUODENOSCOPY (EGD) WITH PROPOFOL  N/A 01/22/2018   Procedure: ESOPHAGOGASTRODUODENOSCOPY (EGD) WITH PROPOFOL ;  Surgeon: Kenney Peacemaker, MD;  Location: [facility];  Service: Endoscopy;  Laterality: N/A;   EYE SURGERY     FALSE ANEURYSM REPAIR Left 08/22/2020   Procedure: EXCLUSION OF LEFT POPLITEAL ARTERY ANEURYSM;  Surgeon: Fulton Job,  Marine Sia, MD;  Location: [facility] OR;  Service: Vascular;  Laterality: Left;   FEMORAL EMBOLOECTOMY Left 07/2000   with left leg ischemia; Dr. Timm Foot, vascular   FEMORAL-POPLITEAL BYPASS GRAFT Bilateral 08/22/2020   Procedure: LEFT FEMORAL-POSTERIOR TIBIAL ARTERY BYPASS GRAFT USING RIGHT GREATER NONREVERSED SAPHENOUS VEIN GRAFT;  Surgeon: Young Hensen, MD;  Location: [facility] OR;  Service: Vascular;  Laterality: Bilateral;   GANGLION CYST EXCISION Right    "wrist"; Dr. Lorena Rolling   HOT HEMOSTASIS N/A 01/22/2018   Procedure: HOT HEMOSTASIS (ARGON PLASMA COAGULATION/BICAP);  Surgeon: Kenney Peacemaker, MD;  Location: [facility];  Service: Endoscopy;  Laterality: N/A;   LOOP RECORDER INSERTION N/A 04/25/2019   Procedure: LOOP RECORDER INSERTION;  Surgeon: Verona Goodwill, MD;  Location: [facility] INVASIVE CV LAB;  Service: Cardiovascular;  Laterality: N/A;   LOOP RECORDER REMOVAL N/A 07/01/2019   Procedure: LOOP RECORDER REMOVAL;  Surgeon:  Verona Goodwill, MD;  Location: [facility] INVASIVE CV LAB;  Service: Cardiovascular;  Laterality: N/A;   LUMBAR LAMINECTOMY  1972   Dr. Bobbye Burrow   PACEMAKER IMPLANT N/A 07/01/2019   Procedure: PACEMAKER IMPLANT;  Surgeon: Verona Goodwill, MD;  Location: [facility] INVASIVE CV LAB;  Service: Cardiovascular;  Laterality: N/A;   PERIPHERAL VASCULAR BALLOON ANGIOPLASTY Left 10/24/2021   Procedure: PERIPHERAL VASCULAR BALLOON ANGIOPLASTY;  Surgeon: Young Hensen, MD;  Location: [facility];  Service: Cardiovascular;  Laterality: Left;   RIGHT/LEFT HEART CATH AND CORONARY ANGIOGRAPHY N/A 09/10/2016   Procedure: Right/Left Heart Cath and Coronary Angiography;  Surgeon: Odie Benne, MD;  Location: [facility] INVASIVE CV LAB;  Service: Cardiovascular;  Laterality: N/A;   TONSILLECTOMY      Social History:  Ambulatory   walker      reports that he quit smoking about 25 years ago. His smoking use included cigarettes, pipe, and cigars. He started smoking about 65 years ago. He has a 100 pack-year smoking history. He has never been exposed to tobacco smoke. He has never used smokeless tobacco. He reports that he does not drink alcohol  and does not use drugs.     Family History:   Family History  Adopted: Yes  Problem Relation Age of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

## 2023-12-03 NOTE — Progress Notes (Signed)
 eLink Physician-Brief Progress Note Patient Name: [name] DOB: Apr 11, 1941 MRN: [id number]   Date of Service  12/03/2023  HPI/Events of Note  Patient admitted with hemoptysis, Lung consolidation, pleural effusion, and acute on chronic hypoxic respiratory failure in the context of COPD with chronic hypoxemic / hypercapnic respiratory failure. Work up is in progress.  eICU Interventions  New Patient Evaluation.        Calistro Rauf U Yehudis Monceaux 12/03/2023, 11:28 PM

## 2023-12-03 NOTE — Assessment & Plan Note (Signed)
 Continue home meds Appreciate Pulmonology consult

## 2023-12-03 NOTE — Assessment & Plan Note (Signed)
 this patient has acute respiratory failure with Hypoxia  as documented by the presence of following: O2 saturatio< 90% on 3L    Likely due to: hemoptysis Provide O2 therapy and titrate as needed  Continuous pulse ox   check Pulse ox with ambulation prior to discharge

## 2023-12-03 NOTE — ED Triage Notes (Signed)
 Pt satting 89% on 3L, placed pt on 4L via [place], pt satting 90%

## 2023-12-03 NOTE — Assessment & Plan Note (Addendum)
 New onset hemoptysis x 3 days with associated large left apical consolidation concerning for lung mass, Infection vs hemothorax. Fall ~4-5 weeks ago, progressive respiratory symptoms with increased shortness of breath, weakness and anorexia. CT chest showed large soft tissues mass /pleural thickening - PET scan is pending for next week.  Patient high risk for decompensation, needs hospitalization for further evaluation  Unable for direct admission , sent to ER for admission through hospitalist group.  Will need pulmonary consult -Dr. Villa Greaser patient. PCCM aware and will await consult request.  [facility] ER triage contacted that patient is en route via private vehicle. Patient daughter to take patient directly, declined EMS.

## 2023-12-04 ENCOUNTER — Inpatient Hospital Stay (HOSPITAL_COMMUNITY)

## 2023-12-04 DIAGNOSIS — J90 Pleural effusion, not elsewhere classified: Secondary | ICD-10-CM

## 2023-12-04 DIAGNOSIS — R042 Hemoptysis: Secondary | ICD-10-CM | POA: Diagnosis not present

## 2023-12-04 DIAGNOSIS — J449 Chronic obstructive pulmonary disease, unspecified: Secondary | ICD-10-CM | POA: Diagnosis not present

## 2023-12-04 LAB — OSMOLALITY: Osmolality: 317 mosm/kg — ABNORMAL HIGH (ref 275–295)

## 2023-12-04 LAB — COMPREHENSIVE METABOLIC PANEL WITH GFR
ALT: 17 U/L (ref 0–44)
AST: 24 U/L (ref 15–41)
Albumin: 3.4 g/dL — ABNORMAL LOW (ref 3.5–5.0)
Alkaline Phosphatase: 53 U/L (ref 38–126)
Anion gap: 16 — ABNORMAL HIGH (ref 5–15)
BUN: 57 mg/dL — ABNORMAL HIGH (ref 8–23)
CO2: 19 mmol/L — ABNORMAL LOW (ref 22–32)
Calcium: 8.6 mg/dL — ABNORMAL LOW (ref 8.9–10.3)
Chloride: 96 mmol/L — ABNORMAL LOW (ref 98–111)
Creatinine, Ser: 2.15 mg/dL — ABNORMAL HIGH (ref 0.61–1.24)
GFR, Estimated: 30 mL/min — ABNORMAL LOW (ref >60)
Glucose, Bld: 252 mg/dL — ABNORMAL HIGH (ref 70–99)
Potassium: 5.2 mmol/L — ABNORMAL HIGH (ref 3.5–5.1)
Sodium: 131 mmol/L — ABNORMAL LOW (ref 135–145)
Total Bilirubin: 1.6 mg/dL — ABNORMAL HIGH (ref 0.0–1.2)
Total Protein: 7 g/dL (ref 6.5–8.1)

## 2023-12-04 LAB — CBC
HCT: 39.3 % (ref 39.0–52.0)
Hemoglobin: 12 g/dL — ABNORMAL LOW (ref 13.0–17.0)
MCH: 28.2 pg (ref 26.0–34.0)
MCHC: 30.5 g/dL (ref 30.0–36.0)
MCV: 92.5 fL (ref 80.0–100.0)
Platelets: 147 10*3/uL — ABNORMAL LOW (ref 150–400)
RBC: 4.25 MIL/uL (ref 4.22–5.81)
RDW: 14.8 % (ref 11.5–15.5)
WBC: 9.9 10*3/uL (ref 4.0–10.5)
nRBC: 0 % (ref 0.0–0.2)

## 2023-12-04 LAB — MRSA NEXT GEN BY PCR, NASAL: MRSA by PCR Next Gen: NOT DETECTED

## 2023-12-04 LAB — GLUCOSE, CAPILLARY
Glucose-Capillary: 186 mg/dL — ABNORMAL HIGH (ref 70–99)
Glucose-Capillary: 175 mg/dL — ABNORMAL HIGH (ref 70–99)
Glucose-Capillary: 194 mg/dL — ABNORMAL HIGH (ref 70–99)
Glucose-Capillary: 177 mg/dL — ABNORMAL HIGH (ref 70–99)
Glucose-Capillary: 216 mg/dL — ABNORMAL HIGH (ref 70–99)
Glucose-Capillary: 183 mg/dL — ABNORMAL HIGH (ref 70–99)

## 2023-12-04 LAB — OSMOLALITY, URINE: Osmolality, Ur: 350 mosm/kg (ref 300–900)

## 2023-12-04 LAB — CK: Total CK: 56 U/L (ref 49–397)

## 2023-12-04 LAB — PREALBUMIN: Prealbumin: 15 mg/dL — ABNORMAL LOW (ref 18–38)

## 2023-12-04 LAB — POTASSIUM: Potassium: 4.8 mmol/L (ref 3.5–5.1)

## 2023-12-04 LAB — TSH: TSH: 4.127 u[IU]/mL (ref 0.350–4.500)

## 2023-12-04 LAB — PROTIME-INR
INR: 1.1 (ref 0.8–1.2)
Prothrombin Time: 14.7 s (ref 11.4–15.2)

## 2023-12-04 LAB — PHOSPHORUS: Phosphorus: 5.9 mg/dL — ABNORMAL HIGH (ref 2.5–4.6)

## 2023-12-04 LAB — HEMOGLOBIN A1C
Hgb A1c MFr Bld: 8.6 % — ABNORMAL HIGH (ref 4.8–5.6)
Mean Plasma Glucose: 200.12 mg/dL

## 2023-12-04 LAB — MAGNESIUM: Magnesium: 1.8 mg/dL (ref 1.7–2.4)

## 2023-12-04 MED ORDER — CHLORHEXIDINE GLUCONATE CLOTH 2 % EX PADS
6.0000 | MEDICATED_PAD | Freq: Every day | CUTANEOUS | Status: DC
  Administered 2023-12-05 – 2023-12-08 (×5): 6 via TOPICAL

## 2023-12-04 MED ORDER — NALOXONE HCL 0.4 MG/ML IJ SOLN
INTRAMUSCULAR | Status: AC
  Filled 2023-12-04: qty 1

## 2023-12-04 MED ORDER — ORAL CARE MOUTH RINSE: 15.0000 mL | OROMUCOSAL | Status: DC | PRN

## 2023-12-04 MED ORDER — ADULT MULTIVITAMIN W/MINERALS CH
1.0000 | ORAL_TABLET | Freq: Every day | ORAL | Status: DC
  Administered 2023-12-04 – 2023-12-09 (×6): 1 via ORAL
  Filled 2023-12-04 (×6): qty 1

## 2023-12-04 MED ORDER — FENTANYL CITRATE (PF) 100 MCG/2ML IJ SOLN
INTRAMUSCULAR | Status: AC | PRN
  Administered 2023-12-04: 50 ug via INTRAVENOUS

## 2023-12-04 MED ORDER — ENSURE MAX PROTEIN PO LIQD
11.0000 [oz_av] | Freq: Two times a day (BID) | ORAL | Status: DC
  Administered 2023-12-05 – 2023-12-09 (×8): 11 [oz_av] via ORAL
  Filled 2023-12-04 (×11): qty 330

## 2023-12-04 MED ORDER — FENTANYL CITRATE (PF) 100 MCG/2ML IJ SOLN
INTRAMUSCULAR | Status: AC
  Filled 2023-12-04: qty 2

## 2023-12-04 NOTE — Progress Notes (Signed)
 PT Cancellation Note  Patient Details Name: [name] MRN: [id number] DOB: Aug 12, 1940   Cancelled Treatment:    Reason Eval/Treat Not Completed: Medical issues which prohibited therapy  Per RN, for possible thoracentesis. Will check back when medcially ready for therapy. Abelina Hoes PT Acute Rehabilitation Services Office [phone]   Dareen Ebbing 12/04/2023, 9:24 AM

## 2023-12-04 NOTE — Progress Notes (Signed)
 NAME:  [name], MRN:  [id number], DOB:  1941-03-17, LOS: 1 ADMISSION DATE:  12/03/2023, CONSULTATION DATE: 5/8 REFERRING MD: Dr. Tamela Fake, CHIEF COMPLAINT: Hemoptysis  History of Present Illness:  83 year old male with multiple medical problems as outlined below which are significant for severe COPD, chronic hypoxic respiratory failure on 2 L at home, obstructive sleep apnea on CPAP, rheumatoid arthritis, and atrial fibrillation.  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  He presented to the pulmonary clinic on 5/8 with complaints of hemoptysis.  Chest x-ray showed dense consolidation versus effusion versus atelectasis of the left upper lobe.  CT angiogram on 4/22 showed a loculated effusion in that location.  Patient complains of cough with large chunks of bright red blood but not lower than 1/4 cup. He was felt to be high risk of decompensation and was referred to the ED for further care. PCCM was consulted.   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), Anxiety, Aortic aneurysm (HCC) (09/02/2017), BENIGN PROSTATIC HYPERTROPHY (11/23/2009), Cardiomyopathy (HCC) (08/28/2016), Chronic systolic CHF (congestive heart failure) (HCC) (09/02/2017), COPD (chronic obstructive pulmonary disease) (HCC), CORONARY ARTERY DISEASE (11/23/2009), DECREASED HEARING, LEFT EAR (03/01/2010), DEGENERATIVE JOINT DISEASE (11/23/2009), DEPRESSION (11/23/2009), FATIGUE (11/23/2009), GAIT DISTURBANCE (12/10/2009), HEMOPTYSIS UNSPECIFIED (05/07/2010), High cholesterol, HYPERTENSION (07/30/2009), HYPOTHYROIDISM (07/30/2009), Ischemic cardiomyopathy (09/02/2017), LUMBAR RADICULOPATHY, 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   Significant Hospital Events: Including procedures, antibiotic start and stop dates in addition to other pertinent events   5/8 admit  Interim History / Subjective:    Objective    Blood pressure (!) 171/90, pulse 62, temperature (!) 97.5 F (36.4 C), temperature source Oral, resp. rate 19, height 6\' 2"  (1.88 m), weight 82.6 kg, SpO2 (!) 89%.    FiO2 (%):  [32 %-100 %] 32 %   Intake/Output Summary (Last 24 hours) at 12/04/2023 0931 Last data filed at 12/04/2023 0806 Gross per 24 hour  Intake 1414.64 ml  Output 875 ml  Net 539.64 ml   Filed Weights   12/03/23 1515 12/03/23 2321  Weight: 86.2 kg 82.6 kg    Examination: General: Elderly gentleman in NAD HENT: [place]/AT, no JVD Lungs: Clear bilateral breath sounds. No wheeze Cardiovascular: RRR, no MRG Abdomen: Soft, NT, ND Extremities: No acute deformity or ROM limitation Neuro: Alert, oriented, non-focal  Resolved Hospital Problem list:    Assessment & Plan:   Hemoptysis: sounds like not a significant amount of bleeding, but certainly with his poor reserves from chronic lung disease it won't take much to really cause him some distress. Sats were quite poor when he initially arrived to the ED (70s).  - Supplemental O2 as needed to keep O2 sats 88-95% - TXA nebs x 3, then stop  LUL opacity: I suspect this will be loculated effusion as he did have a small effusion on 4/22 CT in the same location. Now it is quite a bit larger by Xray. He fell about a week ago and landed on the left side so this may be hemothorax. Could also be collapse secondary to blood clot or malignancy. CT showing what  looks like a loculated pleural effusion adjacent to the left upper lobe. Differential includes hemothorax following falls.  - Discussed with IR. Fluid  collection is not well defined even on recent CT. Unclear if this is a pleural collection or parenchymal. Density seems consistent with blood.  - Hold plavix , last dose 5/8 AM - Did eat breakfast 5/9 AM  COPD without acute exacerbation OSA on CPAP Chronic hypoxemic respiratory failure on 2-3L home oxygen  therapy - Sat goal 88-95%, supplemental O2 titrated to goal. 8 L currently.  - at bedtime CPAP - Anoro in place of home stiolto  Remainder per primary   Best Practice (right click and "Reselect all SmartList Selections" daily)   Per primary  Critical care time:      Roz Cornelia, AGACNP-BC [facility]  See Amion for personal pager PCCM on call pager [phone] until 7pm. Please call Elink 7p-7a. [phone]  12/04/2023 9:31 AM

## 2023-12-04 NOTE — Assessment & Plan Note (Signed)
 CT scan showing Potential mass although loculated hemorrhage is also possibility.  Appreciate pulmonology consult patient may need for thoracentesis in the morning keep n.p.o. for now Patient would like to discuss with PCCM if thoracentesis would be warranted

## 2023-12-04 NOTE — Consult Note (Addendum)
 Chief Complaint: Recent hemoptysis, respiratory failure, enlarging loculated effusion of left upper lobe lung or intraparenchymal lesion ; referred for image guided aspiration/biopsy versus drain placement of left upper lobe lung effusion vs lesion  Referring Provider(s): Dewald,J  Supervising Physician: Elene Griffes  Patient Status: [facility] - In-pt  History of Present Illness: [name] is an 83 y.o. 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 He was seen by pulmonologist who sent him to the ED.  Patient is not on anticoagulation due to ITP.  He is on Plavix  due to CAD.  Patient was hypoxic with SpO2 of 89% on 3 L, improved on 4 L to 94%.  CT angio chest done 5/8 revealed:  Enlarging soft tissue density in the left apex which has central decreased attenuation but some peripheral nodular areas of enhancement. The significant enlargement over the past 2 weeks suggests that this likely represents an enlarging effusion/hemothorax. The possibility of neoplasm however is not totally excluded. Further workup is recommended.   Chronic calcified pleural plaquing on the right.   Cholelithiasis without complicating factors.   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 Continued follow-up as previously described   No evidence of pulmonary embolism.   Aortic Atherosclerosis  Patient was evaluated by pulmonology and request received for image guided aspiration/biopsy and/or drain placement of left upper lobe lung loculated effusion or intraparenchymal lesion.    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   ablated   Anxiety    Aortic aneurysm (HCC) 09/02/2017   BENIGN PROSTATIC HYPERTROPHY 11/23/2009   Cardiomyopathy (HCC) 08/28/2016   Chronic systolic CHF (congestive heart failure) (HCC) 09/02/2017   COPD (chronic obstructive pulmonary disease) (HCC)    CORONARY ARTERY DISEASE 11/23/2009   DECREASED HEARING, 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   DEGENERATIVE JOINT DISEASE 11/23/2009   DEPRESSION 11/23/2009   FATIGUE 11/23/2009   GAIT DISTURBANCE 12/10/2009   HEMOPTYSIS UNSPECIFIED 05/07/2010   High cholesterol    HYPERTENSION 07/30/2009   HYPOTHYROIDISM 07/30/2009   Ischemic cardiomyopathy 09/02/2017   LUMBAR RADICULOPATHY, 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   On home oxygen  therapy    "2-3L; 24/7" (09/10/2016)   OSA on CPAP    Pneumonia    PTSD (post-traumatic stress disorder) 03/10/2012   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   RA (rheumatoid arthritis) (HCC) 06/11/2011   "qwhere" (09/10/2016)   RESPIRATORY FAILURE, CHRONIC 07/31/2009   Scleritis of both eyes 03/17/2014   Thrombocytopenia (HCC)    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   Type II diabetes mellitus (HCC)     Past Surgical History:  Procedure Laterality Date   ABDOMINAL AORTIC ANEURYSM REPAIR  07/2002   Maximo Spar 12/10/2010   ABDOMINAL AORTOGRAM W/LOWER EXTREMITY N/A 08/16/2020   Procedure: ABDOMINAL AORTOGRAM W/LOWER EXTREMITY;  Surgeon: Young Hensen, MD;  Location: [facility] INVASIVE CV LAB;  Service: Cardiovascular;  Laterality: N/A;   ABDOMINAL AORTOGRAM W/LOWER EXTREMITY N/A 10/24/2021   Procedure: ABDOMINAL AORTOGRAM W/LOWER EXTREMITY;  Surgeon: Young Hensen, MD;  Location: [facility] INVASIVE CV [facility];  Service: Cardiovascular;  Laterality: N/A;   ABDOMINAL AORTOGRAM W/LOWER EXTREMITY N/A 03/13/2022   Procedure: ABDOMINAL AORTOGRAM W/LOWER EXTREMITY;  Surgeon: Young Hensen, MD;  Location: [facility] INVASIVE CV LAB;  Service: Cardiovascular;  Laterality: N/A;   ABDOMINAL EXPLORATION SURGERY  02/2004   w/LOA/notes 12/10/2010; small bowel obstruction repair with adhesiolysis     BACK SURGERY     CARDIAC CATHETERIZATION     2 heart caths in the past.  One in 2000s showed one ulcerated plaque  Rx medically; Second at [facility] /notes 09/05/2016   CATARACT EXTRACTION W/ INTRAOCULAR LENS  IMPLANT, BILATERAL Bilateral 2000s   COLECTOMY     hx of remote ileum resection due to bleeding   COLONOSCOPY WITH PROPOFOL  N/A 01/22/2018   Procedure: COLONOSCOPY WITH PROPOFOL ;  Surgeon: Kenney Peacemaker, MD;  Location: [facility] ENDOSCOPY;  Service: Endoscopy;  Laterality: N/A;   CORONARY ANGIOPLASTY WITH STENT PLACEMENT  09/10/2016   CORONARY STENT INTERVENTION N/A 09/10/2016   Procedure: Coronary Stent Intervention;  Surgeon: Odie Benne, MD;  Location: [facility] CV [facility];  Service: Cardiovascular;  Laterality: N/A;  Distal RCA 4.0x16 Synergy   ESOPHAGOGASTRODUODENOSCOPY (EGD) WITH PROPOFOL  N/A 01/22/2018   Procedure: ESOPHAGOGASTRODUODENOSCOPY (EGD) WITH PROPOFOL ;  Surgeon: Kenney Peacemaker, MD;  Location: [facility] ENDOSCOPY;  Service: Endoscopy;  Laterality: N/A;   EYE SURGERY     FALSE ANEURYSM REPAIR Left 08/22/2020   Procedure: EXCLUSION OF LEFT POPLITEAL ARTERY ANEURYSM;  Surgeon: Young Hensen, MD;  [facility];  Service: Vascular;  Laterality: Left;   FEMORAL EMBOLOECTOMY Left 07/2000   with left leg ischemia; Dr. Timm Foot, vascular   FEMORAL-POPLITEAL BYPASS GRAFT Bilateral 08/22/2020   Procedure: LEFT FEMORAL-POSTERIOR TIBIAL ARTERY BYPASS GRAFT USING RIGHT GREATER NONREVERSED SAPHENOUS VEIN GRAFT;  Surgeon: Young Hensen, MD;  Location: [facility] OR;  Service: Vascular;  Laterality: Bilateral;   GANGLION CYST EXCISION Right    "wrist"; Dr. Lorena Rolling   HOT HEMOSTASIS N/A 01/22/2018   Procedure: HOT HEMOSTASIS (ARGON PLASMA COAGULATION/BICAP);  Surgeon: Kenney Peacemaker, MD;  Location: [facility];  Service: Endoscopy;  Laterality: N/A;   LOOP RECORDER INSERTION N/A 04/25/2019   Procedure: LOOP RECORDER INSERTION;  Surgeon: Verona Goodwill, MD;  Location: [facility] INVASIVE CV LAB;  Service:  Cardiovascular;  Laterality: N/A;   LOOP RECORDER REMOVAL N/A 07/01/2019   Procedure: LOOP RECORDER REMOVAL;  Surgeon: Verona Goodwill, MD;  Location: [facility] INVASIVE CV LAB;  Service: Cardiovascular;  Laterality: N/A;   LUMBAR LAMINECTOMY  1972   Dr. Bobbye Burrow   PACEMAKER IMPLANT N/A 07/01/2019   Procedure: PACEMAKER IMPLANT;  Surgeon: Verona Goodwill, MD;  Location: [facility] INVASIVE CV LAB;  Service: Cardiovascular;  Laterality: N/A;   PERIPHERAL VASCULAR BALLOON ANGIOPLASTY Left 10/24/2021   Procedure: PERIPHERAL VASCULAR BALLOON ANGIOPLASTY;  Surgeon: Young Hensen, MD;  Location: [facility];  Service: Cardiovascular;  Laterality: Left;   RIGHT/LEFT HEART CATH AND CORONARY ANGIOGRAPHY N/A 09/10/2016   Procedure: Right/Left Heart Cath and Coronary Angiography;  Surgeon: Odie Benne, MD;  Location: [facility] INVASIVE CV LAB;  Service: Cardiovascular;  Laterality: N/A;   TONSILLECTOMY      Allergies: Patient has no known allergies.  Medications: Prior to Admission medications   Medication Sig Start Date End Date Taking? Authorizing Provider  acetaminophen  (TYLENOL ) 500 MG tablet Take 1,000 mg by mouth every 6 (six) hours as needed for mild pain.   Yes [provider]  albuterol  (PROVENTIL ) (2.5 MG/3ML) 0.083% nebulizer solution Take 3 mLs (2.5 mg total) by nebulization every 6 (six) hours as needed for wheezing or shortness of breath. 10/25/20  Yes Parrett, Benn Brash  S, NP  albuterol  (VENTOLIN  HFA) 108 (90 Base) MCG/ACT inhaler INHALE 2 PUFFS INTO THE LUNGS EVERY 6 HOURS AS NEEDED FOR WHEEZING OR SHORTNESS OF BREATH 04/17/23  Yes Parrett, Tammy S, NP  atorvastatin  (LIPITOR ) 40 MG tablet Take 1 tablet (40 mg total) by mouth daily. 10/21/16  Yes Elmyra Haggard, MD  Carboxymethylcellul-Glycerin (LUBRICATING EYE DROPS OP) Place 1 drop into both eyes 4 (four) times daily as needed (dry eyes).    Yes [provider]  carvedilol  (COREG ) 3.125 MG tablet TAKE 1 TABLET(3.125 MG) BY MOUTH  TWICE DAILY WITH A MEAL Patient taking differently: Take 3.125 mg by mouth 2 (two) times daily with a meal. 07/20/23  Yes Adelia Homestead, MD  Cholecalciferol  (VITAMIN D3) 2000 units capsule Take 2,000 Units by mouth daily.  01/11/16  Yes [provider]  clopidogrel  (PLAVIX ) 75 MG tablet TAKE 1 TABLET(75 MG) BY MOUTH DAILY Patient taking differently: Take 75 mg by mouth daily. 09/08/23  Yes Young Hensen, MD  Continuous Blood Gluc Sensor (DEXCOM G7 SENSOR) MISC Use to check blood sugars 3x a day. DX CODE: E11.69, E78.5 Patient taking differently: Inject 1 Device into the skin See admin instructions. Change sensor every 10 days. 08/20/22  Yes Adelia Homestead, MD  cyanocobalamin  (VITAMIN B12) 500 MCG tablet Take 500 mcg by mouth daily. 07/30/22  Yes [provider]  Empagliflozin-metFORMIN  HCl ER 25-1000 MG TB24 Take 1 tablet by mouth daily at 12 noon.   Yes [provider]  ferrous sulfate  324 (65 Fe) MG TBEC Take 324 mg by mouth daily. 10/02/21  Yes [provider]  fluticasone  (FLONASE ) 50 MCG/ACT nasal spray Place 1 spray into both nostrils daily. Patient taking differently: Place 1 spray into both nostrils daily as needed for allergies. 07/17/21  Yes Cobb, Mariah Shines, NP  guaifenesin  (HUMIBID E) 400 MG TABS tablet Take 400 mg by mouth in the morning and at bedtime. 05/23/23  Yes [provider]  imiquimod (ALDARA) 5 % cream Apply 1 Application topically 3 (three) times a week. APPLY TO WARTS 3-5 TIMES PER WEEK FOR 6 WEEKS OR UNTIL WARTS DISAPPEAR 12/08/22  Yes [provider]  insulin  aspart (NOVOLOG ) 100 UNIT/ML injection Inject 0-12 Units into the skin 3 (three) times daily before meals.   Yes [provider]  insulin  glargine (LANTUS ) 100 UNIT/ML injection Inject 20 Units into the skin daily.   Yes [provider]  levothyroxine  (SYNTHROID ) 125 MCG tablet Take 125 mcg by mouth daily before breakfast. 12/07/22   Yes [provider]  losartan  (COZAAR ) 100 MG tablet Take 1 tablet (100 mg total) by mouth daily. 12/24/22  Yes Lenise Quince, MD  metformin  (FORTAMET ) 500 MG (OSM) 24 hr tablet Take 500 mg by mouth at bedtime.   Yes [provider]  Multiple Vitamins-Minerals (MEGA MULTIVITAMIN FOR MEN PO) Take 1 tablet by mouth daily.   Yes [provider]  Olodaterol HCl 2.5 MCG/ACT AERS Inhale 2 puffs into the lungs daily at 12 noon.   Yes [provider]  OXYGEN  Inhale 2 L into the lungs continuous.   Yes [provider]  predniSONE  (DELTASONE ) 10 MG tablet Take 1 tablet (10 mg total) by mouth daily with breakfast. 12/01/23  Yes Cameron Cea, MD  pregabalin  (LYRICA ) 100 MG capsule Take 100 mg by mouth daily.   Yes [provider]  Semaglutide  (OZEMPIC , 1 MG/DOSE, [place]) Inject 1 mg into the skin every Friday.   Yes [provider]  sertraline  (ZOLOFT ) 100 MG tablet Take 50 mg by mouth daily. 11/06/22  Yes [provider]  tamsulosin  (FLOMAX ) 0.4 MG CAPS capsule Take 1 capsule (0.4 mg total) by mouth at bedtime. 09/14/17  Yes Jobe Mulder, DO  traZODone  (DESYREL ) 50 MG tablet Take 50 mg by mouth at bedtime. 11/06/22  Yes [provider]  empagliflozin (JARDIANCE) 10 MG TABS tablet Take 5 mg by mouth daily. Patient not taking: Reported on 12/04/2023    [provider]  Tiotropium Bromide -Olodaterol (STIOLTO RESPIMAT ) 2.5-2.5 MCG/ACT AERS Inhale 2 puffs into the lungs daily. Patient not taking: Reported on 12/04/2023 03/01/21   Parrett, Macdonald Savoy, NP     Family History  Adopted: Yes  Problem Relation Age of Onset   Other Mother        gun shot    Social History   Socioeconomic History   Marital status: Widowed    Spouse name: Not on file   Number of children: Not on file   Years of education: Not on file   Highest education level: Not on file  Occupational History   Occupation: disabled veteran, Ex marine  [employer]    Employer: RETIRED  Tobacco Use   Smoking status: Former    Current packs/day: 0.00    Average packs/day: 2.5 packs/day for 40.0 years (100.0 ttl pk-yrs)    Types: Cigarettes, Pipe, Cigars    Start date: 07/28/1958    Quit date: 07/28/1998    Years since quitting: 25.3    Passive exposure: Never   Smokeless tobacco: Never  Vaping Use   Vaping status: Never Used  Substance and Sexual Activity   Alcohol  use: No    Alcohol /week: 0.0 standard drinks of alcohol    Drug use: No   Sexual activity: Not Currently  Other Topics Concern   Not on file  Social History Narrative   Lives in the home with his wife   Sees [place] every 6 month for meds.   Denies asbestos exposure   Social Drivers of Corporate investment banker Strain: Low Risk  (01/06/2023)   Overall Financial Resource Strain (CARDIA)    Difficulty of Paying Living Expenses: Not hard at all  Food Insecurity: No Food Insecurity (12/04/2023)   Hunger Vital Sign    Worried About Running Out of Food in the Last Year: Never true    Ran Out of Food in the Last Year: Never true  Transportation Needs: No Transportation Needs (12/04/2023)   PRAPARE - Administrator, Civil Service (Medical): No    Lack of Transportation (Non-Medical): No  Physical Activity: Inactive (01/06/2023)   Exercise Vital Sign    Days of Exercise per Week: 0 days    Minutes of Exercise per Session: 0 min  Stress: No Stress Concern Present (01/06/2023)   [employer] of Occupational Health - Occupational Stress Questionnaire    Feeling of Stress : Not at all  Social Connections: Socially Isolated (12/04/2023)   Social Connection and Isolation Panel [NHANES]    Frequency of Communication with Friends and Family: More than three times a week    Frequency of Social Gatherings with Friends and Family: More than three times a week    Attends Religious Services: Never    Database administrator or Organizations: No    Attends Banker  Meetings: Never    Marital Status: Widowed       Review of Systems currently denies fever, headache, chest  pain, abdominal pain, back pain, nausea, vomiting.  He does have some chronic dyspnea, occasional cough, some recent hemoptysis  Vital Signs: BP (!) 152/60   Pulse 60   Temp (!) 97.5 F (36.4 C) (Oral)   Resp (!) 23   Ht 6\' 2"  (1.88 m)   Wt 182 lb 1.6 oz (82.6 kg)   SpO2 97%   BMI 23.38 kg/m   Advance Care Plan: No documents on file  Physical Exam patient awake, alert.  Chest with diminished breath sounds left upper lobe,otherwise distant but clear BS; heart with regular rate and rhythm, left chest wall pacemaker; abdomen soft, positive bowel sounds, nontender.  No lower extremity edema  Imaging: CT Angio Chest PE W and/or Wo Contrast Result Date: 12/03/2023 CLINICAL DATA:  Hemoptysis for several days, initial encounter EXAM: CT ANGIOGRAPHY CHEST WITH CONTRAST TECHNIQUE: Multidetector CT imaging of the chest was performed using the standard protocol during bolus administration of intravenous contrast. Multiplanar CT image reconstructions and MIPs were obtained to evaluate the vascular anatomy. RADIATION DOSE REDUCTION: This exam was performed according to the departmental dose-optimization program which includes automated exposure control, adjustment of the mA and/or kV according to patient size and/or use of iterative reconstruction technique. CONTRAST:  60mL OMNIPAQUE  IOHEXOL  350 MG/ML SOLN COMPARISON:  Chest x-ray from earlier in the same day. CT from 10/16/2022 and 11/17/2023 FINDINGS: Cardiovascular: Atherosclerotic calcifications of the thoracic aorta and its branches are noted. Stable saccular aneurysmal dilatation of the distal thoracic aortic arch is noted extending inferiorly. This measures approximately 4.1 cm in greatest dimension. Descending thoracic aorta shows no aneurysmal dilatation or dissection. The pulmonary artery shows a normal branching pattern bilaterally. No  intraluminal filling defect to suggest pulmonary embolism is noted. Mediastinum/Nodes: Thoracic inlet is within normal limits. The esophagus as visualized is unremarkable. Scattered small mediastinal nodes are seen. Lungs/Pleura: Calcified pleural plaques are again seen on the right. Emphysematous changes and areas of scarring are noted within the right lung. Small dependent effusion is noted in the left hemithorax. There is been significant increase in the soft tissue density along the left pleural margin measuring up to 10.3 x 9.5 cm on today's exam. It previously measured 6.7 x 4.3 cm. The nodular areas of enhancement peripherally are again seen. This masslike density displaces the adjacent left upper lobe. Considerable emphysematous changes are seen. Upper Abdomen: Visualized upper abdomen demonstrates multiple gallstones within the gallbladder without complicating factors. Musculoskeletal: Old rib fractures are seen on the left and stable Review of the MIP images confirms the above findings. IMPRESSION: Enlarging soft tissue density in the left apex which has central decreased attenuation but some peripheral nodular areas of enhancement. The significant enlargement over the past 2 weeks suggests that this likely represents an enlarging effusion/hemothorax. The possibility of neoplasm however is not totally excluded. Further workup is recommended. Chronic calcified pleural plaquing on the right. Cholelithiasis without complicating factors. 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 Continued follow-up as previously described No evidence of pulmonary embolism. Aortic Atherosclerosis (ICD10-I70.0). Electronically Signed   By: Violeta Grey M.D.   On: 12/03/2023 21:05   DG Chest Port 1 View Result Date: 12/03/2023 CLINICAL DATA:  Cough, shortness of breath. EXAM: PORTABLE CHEST 1 VIEW COMPARISON:  Same day. FINDINGS: Stable cardiomediastinal silhouette. Left-sided pacemaker is unchanged.  Calcified pleural plaques are noted in right lung consistent with asbestos exposure. Stable large masslike density is noted in left upper lobe concerning for neoplasm or malignancy. Minimal bibasilar subsegmental atelectasis  is noted. Bony thorax is unremarkable. IMPRESSION: Stable large masslike density is noted and left lung apex highly concerning for neoplasm or malignancy. Calcified pleural plaques are noted in right lung concerning for asbestos exposure. Minimal bibasilar subsegmental atelectasis. Electronically Signed   By: Rosalene Colon M.D.   On: 12/03/2023 16:34   DG Chest 2 View Result Date: 12/03/2023 CLINICAL DATA:  Hemoptysis. EXAM: CHEST - 2 VIEW COMPARISON:  Chest radiograph dated 09/19/2022. chest CT dated 11/17/2023. FINDINGS: There is opacification of the left apex concerning for a mass/malignancy. Background of emphysema. Right lung pleural parenchyma calcification. No pneumothorax. The cardiac silhouette is within normal limits. Left pectoral pacemaker device. No acute osseous pathology. IMPRESSION: Opacification of the left apex concerning for a mass/malignancy. Electronically Signed   By: Angus Bark M.D.   On: 12/03/2023 13:46   CT ANGIO CHEST AORTA W/CM & OR WO/CM Result Date: 11/22/2023 CLINICAL DATA:  Aneurysm of ascending aorta EXAM: CT ANGIOGRAPHY CHEST WITH CONTRAST TECHNIQUE: Multidetector CT imaging of the chest was performed using the standard protocol during bolus administration of intravenous contrast. Multiplanar CT image reconstructions and MIPs were obtained to evaluate the vascular anatomy. Multiplanar image (3D post-processing) reconstructions and MIPs were obtained to evaluate the vascular anatomy. RADIATION DOSE REDUCTION: This exam was performed according to the departmental dose-optimization program which includes automated exposure control, adjustment of the mA and/or kV according to patient size and/or use of iterative reconstruction technique. CONTRAST:  80mL  OMNIPAQUE  IOHEXOL  350 MG/ML SOLN COMPARISON:  CT of the chest performed October 16, 2022 FINDINGS: Cardiovascular: Heart size is within normal limits. No pericardial effusion. Sinus of Valsalva: 36 x 32 x 37 mm Sinotubular junction: 28 x 33 mm Tubular ascending thoracic aorta: 33 x 32 mm Distal aortic arch: 41 mm 39 mm Proximal descending thoracic aorta: 32 mm x 29 mm Descending thoracic aorta at the level of the aortic hiatus: 30 mm x 28 mm Main pulmonary artery: The main pulmonary artery is mildly dilated at 38 mm which may represent sequelae of pulmonary arterial hypertension. Mediastinum/Nodes: No evidence of mediastinal lymphadenopathy. Lungs/Pleura: There has been interval development of a focus of pleural thickening in the left superior hemithorax laterally which measures approximately 6.7 x 4.3 cm on axial imaging and was not present on the previous examination. This is intermediate in density. There is also circumferential pleural thickening at this site which is somewhat nodular in contour. Pleural thickening extends the level of the mid left hemithorax. Also noted is a focus of soft tissue attenuation adjacent to the left ventricular apex which is favored to be external from the pericardium. This measures approximately 2.7 x 0.9 cm on image 95 of series 301. On the right, there are calcified pleural plaques which are grossly similar when compared to the previous exam. Extensive emphysematous changes are present which are broadly speaking similar when compared to the previous examination. Upper Abdomen: Gallstones. Known visceral aneurysms are incompletely captured. Musculoskeletal: Degenerative changes in the thoracic spine, similar. Chronic appearing rib fractures. Review of the MIP images confirms the above findings. IMPRESSION: 1. Interval development of abnormal pleural thickening in the left hemithorax with additional areas of soft tissue abnormality. Differential considerations include malignant  etiologies. Recommend further evaluation by oncology or pulmonology for definitive characterization. 2. 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 3. Recommend semi-annual imaging followup by CTA or MRA and referral to cardiothoracic surgery if not already obtained. This recommendation follows 2010 ACCF/AHA/AATS/ACR/ASA/SCA/SCAI/SIR/STS/SVM  Guidelines for the Diagnosis and Management of Patients With Thoracic Aortic Disease. Circulation. 2010; 121: [id number]. Aortic aneurysm NOS (ICD10-I71.9) 4. Dilation of the main pulmonary artery which can be observed in the setting of pulmonary arterial hypertension. 5. Extensive emphysema. These results will be called to the ordering clinician or representative by the Radiologist Assistant, and communication documented in the PACS or [employer]. Electronically Signed   By: Reagan Camera M.D.   On: 11/22/2023 13:24    Labs:  CBC: Recent Labs    10/06/23 1442 12/01/23 1337 12/03/23 1607 12/04/23 0032  WBC 9.3 10.9* 11.3* 9.9  HGB 13.1 12.4* 11.4* 12.0*  HCT 40.7 37.8* 36.7* 39.3  PLT 137* 150 150 147*    COAGS: Recent Labs    12/04/23 0032  INR 1.1    BMP: Recent Labs    01/02/23 0837 11/17/23 1538 12/03/23 1755 12/04/23 0032 12/04/23 0835  NA 140  --  134* 131*  --   K 4.7  --  4.5 5.2* 4.8  CL 104  --  98 96*  --   CO2 23  --  22 19*  --   GLUCOSE 120*  --  152* 252*  --   BUN 27  --  52* 57*  --   CALCIUM  9.0  --  8.5* 8.6*  --   CREATININE 1.19 1.70* 2.12* 2.15*  --   GFRNONAA  --   --  30* 30*  --     LIVER FUNCTION TESTS: Recent Labs    01/02/23 0837 12/03/23 1755 12/04/23 0032  BILITOT 0.5 1.1 1.6*  AST 18 24 24   ALT 14 16 17   ALKPHOS 83 46 53  PROT 6.2 6.6 7.0  ALBUMIN  4.1 3.5 3.4*    TUMOR MARKERS: No results for input(s): "AFPTM", "CEA", "CA199", "CHROMGRNA" in the last 8760 hours.  Assessment and Plan: 83 y.o. 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 He was seen by pulmonologist who sent him to the ED.  Patient is not on anticoagulation due to ITP.  He is on Plavix  due to CAD.  Patient was hypoxic with SpO2 of 89% on 3 L, improved on 4 L to 94%.  CT angio chest done 5/8 revealed:  Enlarging soft tissue density in the left apex which has central decreased attenuation but some peripheral nodular areas of enhancement. The significant enlargement over the past 2 weeks suggests that this likely represents an enlarging effusion/hemothorax. The possibility of neoplasm however is not totally excluded. Further workup is recommended.   Chronic calcified pleural plaquing on the right.   Cholelithiasis without complicating factors.   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 Continued follow-up as previously described   No evidence of pulmonary embolism.   Aortic Atherosclerosis  Patient was evaluated by pulmonology and request received for image guided aspiration/biopsy and/or drain placement of left upper lobe loculated effusion or intraparenchymal lesion.  Imaging studies have been reviewed by Dr. Julietta Ogren. Risks and benefits discussed with the patient including bleeding, infection, damage to adjacent structures, pneumothorax and sepsis.  All of the patient's questions were answered, patient is agreeable to proceed. Consent signed and in chart.  Patient had lunch today therefore can only offer IV fentanyl  and local anesthetic today; tent plans are to proceed with case later this afternoon.  Thank you for allowing our service to participate in [name] 's care.  Electronically Signed: D. Honore Lux, PA-C   12/04/2023, 2:03 PM      I spent a total of 40 Minutes    in face to face in clinical consultation, greater than 50% of which was counseling/coordinating care for image guided aspiration/biopsy and/or drain placement of left upper lobe lung loculated effusion or intraparenchymal lesion.

## 2023-12-04 NOTE — Progress Notes (Signed)
 Initial Nutrition Assessment  DOCUMENTATION CODES:   Non-severe (moderate) malnutrition in context of chronic illness  INTERVENTION:   -Ensure MAX Protein po BID, each supplement provides 150 kcal and 30 grams of protein   -Multivitamin with minerals daily  NUTRITION DIAGNOSIS:   Moderate Malnutrition related to chronic illness as evidenced by moderate fat depletion, moderate muscle depletion.  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  Patient reports 2 days ago, He has blood with phlegm now coughing up blood clots. Admitted for hemoptysis.  Patient in room, reports he ate some breakfast this morning. Has been relying on pre-ordered delivery meals to help manage his diabetes. He receives 10 meals a week so he consumes 2 of these meals a day. Reports they always have some sort of protein like chicken, a starch and vegetables. Denies any issues with swallowing or chewing. Does not use protein shakes a home but willing to drink them here for additional protein in diet. He also takes a daily MVI.  Per CCM note, plan is for pt to be NPO tomorrow for CT guided needle aspiration and biopsy. Pt reports increased weakness lately and a recent fall.   Per weight records, pt has lost 17 lbs since 4/3 (8% wt loss x 1 month, significant for time frame).  Medications reviewed.  Labs reviewed: CBGs: 175-194  Low Na Elevated Phos  NUTRITION - FOCUSED PHYSICAL EXAM:  Flowsheet  Row Most Recent Value  Orbital Region Mild depletion  Upper Arm Region Severe depletion  Thoracic and Lumbar Region Moderate depletion  Buccal Region Moderate depletion  Temple Region Moderate depletion  Clavicle Bone Region Moderate depletion  Clavicle and Acromion Bone Region Moderate depletion  Scapular Bone Region Moderate depletion  Dorsal Hand Mild depletion  Patellar Region Moderate depletion  Anterior Thigh Region Moderate depletion  Posterior Calf Region Moderate depletion  Edema (RD Assessment) None  Hair Reviewed  [no hair]  Eyes Reviewed  [scleritis]  Mouth Reviewed  Skin Reviewed  [bruises on BUEs]  Nails Reviewed       Diet Order:   Diet Order             Diet NPO time specified  Diet effective midnight           Diet Carb Modified Fluid consistency: Thin; Room service appropriate? Yes  Diet effective now                   EDUCATION NEEDS:   No education needs have been identified at this time  Skin:  Skin Assessment: Reviewed RN Assessment  Last BM:  5/7  Height:   Ht Readings from Last 1 Encounters:  12/03/23 6\' 2"  (1.88 m)    Weight:   Wt Readings from Last 1 Encounters:  12/03/23 82.6 kg    BMI:  Body mass index is 23.38 kg/m.  Estimated Nutritional Needs:   Kcal:  2100-2300  Protein:  110-120g  Fluid:  2.1L/day   Arna Better,  MS, RD, LDN Inpatient Clinical Dietitian Contact via Secure chat

## 2023-12-04 NOTE — Procedures (Signed)
 Interventional Radiology Procedure:   Indications: Enlarging collection in left upper chest is suggestive for a hemothorax  Procedure: CT guided left chest tube placement  Findings: Yueh catheter placed and aspirated dark bloody fluid.  Placed 12 Fr drain and attached to drainage system.    Complications: None     EBL: Minimal  Plan: Fluid sent for cytology and culture.  Chest tube management per Critical Care  Elvia Aydin R. Julietta Ogren, MD  Pager: [phone]

## 2023-12-04 NOTE — Progress Notes (Signed)
 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  Patient reports 2 days ago, He has blood with phlegm now coughing up blood clots. He was seen by pulmonologist who sent him to the ED.  Patient is not on anticoagulation due to ITP.  He is on Plavix  due to CAD.  Patient was hypoxic with SpO2 of 89% on 3 L, improved on 4 L to 94%.  Patient was admitted for further evaluation.  Assessment & Plan:   Principal Problem:   Hemoptysis Active Problems:   Diabetes mellitus with complication, with long-term current use of insulin  (HCC)   COPD (chronic obstructive pulmonary disease) (HCC)   Hypothyroidism   Acute on chronic respiratory failure (HCC)   OSA on CPAP   PAF (paroxysmal atrial fibrillation) (HCC)   Lung mass   Idiopathic thrombocytopenic purpura (ITP) (HCC)   AKI (acute kidney injury) (HCC)   Lung nodules   Pacemaker   Rheumatoid arthritis (HCC)   S/P femoral-tibial bypass   QT prolongation   Pleural effusion    Hemoptysis: Patient presented with hemoptysis for 2 days. His SpO2 is 88 to 95% on 4 L.  Continue supplemental oxygen . Continue tranexamic acid  nebs. CT chest shows left upper lobe opacity appears loculated effusion. He fell about a week ago and landed on the left side so this may be hemothorax. Pulmonology plans to do CT guided needle aspiration and send for biopsy.  IR consulted.  COPD: Continue home meds Appreciate Pulmonology consult.   Diabetes mellitus with hyperglycemia: Continue Semglee  15 units daily. Continue sensitive  regular sliding scale. Hold p.o. diabetic medications.   Obtain hemoglobin A1c and TSH   Idiopathic thrombocytopenic purpura (ITP) (HCC) Continue chronic steroids prednisone  10 mg poq day   OSA on CPAP: Continue CPAP at night.   Pacemaker: Chronic,  stable   PAF (paroxysmal atrial fibrillation) (HCC) Resume Coreg  when blood pressure improves. Hold Eliquis  in the setting of hemoptysis   Rheumatoid arthritis (HCC) Hold arava  for now.   S/P femoral-tibial bypass: Hold Plavix .   Lung nodules: Appreciate pulmonology consult. May need thoracentesis   Acute on chronic respiratory failure [facility]): Patient uses supplemental oxygen  2 L at baseline,  now requiring 4 L likely due to hemoptysis.  Continue supplemental oxygen  and wean as tolerated.[name]  AKI (acute kidney injury) (HCC) Due to decreased p.o. intake.   Baseline creatinine normal,  presented with creatinine 2.15 Continue IV hydration and monitor serum creatinine.   QT prolongation Avoid QT prolonging medications, rehydrate and correct electrolytes. Repeat EKG.   Hypothyroidism: Continue Synthroid  125 mcg daily.   Lung mass CT scan showing Potential mass although loculated hemorrhage is also possibility.  Appreciate pulmonology consult patient may need for thoracentesis.  Pulmonology recommended CT guided needle aspiration and send for biopsy.  IR consulted.  DVT prophylaxis: SCDs Code Status: DNR Family Communication: No family at bedside Disposition Plan:    Status is: Inpatient Remains inpatient appropriate because: Admitted for hemoptysis,  pulmonology is consulted   Consultants:  Pulmonology  Procedures:CT chest  Antimicrobials:  Anti-infectives (From  admission, onward)    None      Subjective: Seen and examined at bedside.  Overnight events noted. Patient reports feeling better,  states pulmonology told him he needs the biopsy of that mass.  Objective: Vitals:   12/04/23 0632 12/04/23 0700  12/04/23 0800 12/04/23 0823  BP:  (!) 139/54 (!) 171/90   Pulse:  62 62   Resp:  19 19   Temp:    (!) 97.5 F (36.4 C)  TempSrc:    Oral  SpO2: 93% 91% (!) 89%   Weight:      Height:        Intake/Output Summary (Last 24 hours) at 12/04/2023 1019 Last data filed at 12/04/2023 0806 Gross per 24 hour  Intake 1414.64 ml  Output 875 ml  Net 539.64 ml   Filed Weights   12/03/23 1515 12/03/23 2321  Weight: 86.2 kg 82.6 kg    Examination:  General exam: Appears calm and comfortable, deconditioned, not in any acute distress. Respiratory system: Clear to auscultation. Respiratory effort normal.  RR 16 Cardiovascular system: S1 & S2 heard, RRR. No JVD, murmurs, rubs, gallops or clicks. Gastrointestinal system: Abdomen is non distended, soft and non tender. Normal bowel sounds heard. Central nervous system: Alert and oriented x 3. No focal neurological deficits. Extremities: No edema, no cyanosis, no clubbing. Skin: No rashes, lesions or ulcers Psychiatry: Judgement and insight appear normal. Mood & affect appropriate.     Data Reviewed: I have personally reviewed following labs and imaging studies  CBC: Recent Labs  Lab 12/01/23 1337 12/03/23 1607 12/04/23 0032  WBC 10.9* 11.3* 9.9  NEUTROABS 8.9*  --   --   HGB 12.4* 11.4* 12.0*  HCT 37.8* 36.7* 39.3  MCV 86.1 90.8 92.5  PLT 150 150 147*   Basic Metabolic Panel: Recent Labs  Lab 12/03/23 1755 12/04/23 0032 12/04/23 0835  NA 134* 131*  --   K 4.5 5.2* 4.8  CL 98 96*  --   CO2 22 19*  --   GLUCOSE 152* 252*  --   BUN 52* 57*  --   CREATININE 2.12* 2.15*  --   CALCIUM  8.5* 8.6*  --   MG 1.7 1.8  --   PHOS 4.3 5.9*  --    GFR: Estimated Creatinine Clearance: 30.8 mL/min (A) (by C-G formula based on SCr of 2.15 mg/dL (H)). Liver Function Tests: Recent Labs  Lab 12/03/23 1755 12/04/23 0032  AST 24 24  ALT 16 17  ALKPHOS 46 53  BILITOT 1.1 1.6*  PROT 6.6 7.0  ALBUMIN  3.5 3.4*   No results for input(s):  "LIPASE", "AMYLASE" in the last 168 hours. No results for input(s): "AMMONIA" in the last 168 hours. Coagulation Profile: Recent Labs  Lab 12/04/23 0032  INR 1.1   Cardiac Enzymes: Recent Labs  Lab 12/04/23 0032  CKTOTAL 56   BNP (last 3 results) No results for input(s): "PROBNP" in the last 8760 hours. HbA1C: Recent Labs    12/04/23 0032  HGBA1C 8.6*   CBG: Recent Labs  Lab 12/03/23 2350 12/04/23 0428 12/04/23 0752  GLUCAP 263* 186* 175*   Lipid Profile: No results for input(s): "CHOL", "HDL", "LDLCALC", "TRIG", "CHOLHDL", "LDLDIRECT" in the last 72 hours. Thyroid  Function Tests: Recent Labs    12/04/23 0032  TSH 4.127   Anemia Panel: No results for input(s): "VITAMINB12", "FOLATE", "FERRITIN", "TIBC", "IRON ", "RETICCTPCT" in the last 72 hours. Sepsis Labs: Recent Labs  Lab 12/03/23 1755  PROCALCITON 0.13  Recent Results (from the past 240 hours)  Resp panel by RT-PCR (RSV, Flu A&B, Covid) Anterior Nasal Swab     Status: None   Collection Time: 12/03/23  4:07 PM   Specimen: Anterior Nasal Swab  Result Value Ref Range Status   SARS Coronavirus 2 by RT PCR NEGATIVE NEGATIVE Final    Comment: (NOTE) SARS-CoV-2 target nucleic acids are NOT DETECTED.  The SARS-CoV-2 RNA is generally detectable in upper respiratory specimens during the acute phase of infection. The lowest concentration of SARS-CoV-2 viral copies this assay can detect is 138 copies/mL. A negative result does not preclude SARS-Cov-2 infection and should not be used as the sole basis for treatment or other patient management decisions. A negative result may occur with  improper specimen collection/handling, submission of specimen other than nasopharyngeal swab, presence of viral mutation(s) within the areas targeted by this assay, and inadequate number of viral copies(<138 copies/mL). A negative result must be combined with clinical observations, patient history, and  epidemiological information. The expected result is Negative.  Fact Sheet for Patients:  BloggerCourse.com  Fact Sheet for Healthcare Providers:  SeriousBroker.it  This test is no t yet approved or cleared by the [place]  FDA and  has been authorized for detection and/or diagnosis of SARS-CoV-2 by FDA under an Emergency Use Authorization (EUA). This EUA will remain  in effect (meaning this test can be used) for the duration of the COVID-19 declaration under Section 564(b)(1) of the Act, 21 U.S.C.section 360bbb-3(b)(1), unless the authorization is terminated  or revoked sooner.       Influenza A by PCR NEGATIVE NEGATIVE Final   Influenza B by PCR NEGATIVE NEGATIVE Final    Comment: (NOTE) The Xpert Xpress SARS-CoV-2/FLU/RSV plus assay is intended as an aid in the diagnosis of influenza from Nasopharyngeal swab specimens and should not be used as a sole basis for treatment. Nasal washings and aspirates are unacceptable for Xpert Xpress SARS-CoV-2/FLU/RSV testing.  Fact Sheet for Patients: BloggerCourse.com  Fact Sheet for Healthcare Providers: SeriousBroker.it  This test is not yet approved or cleared by the [place]  FDA and has been authorized for detection and/or diagnosis of SARS-CoV-2 by FDA under an Emergency Use Authorization (EUA). This EUA will remain in effect (meaning this test can be used) for the duration of the COVID-19 declaration under Section 564(b)(1) of the Act, 21 U.S.C. section 360bbb-3(b)(1), unless the authorization is terminated or revoked.     Resp Syncytial Virus by PCR NEGATIVE NEGATIVE Final    Comment: (NOTE) Fact Sheet for Patients: BloggerCourse.com  Fact Sheet for Healthcare Providers: SeriousBroker.it  This test is not yet approved or cleared by the [place]  FDA and has been  authorized for detection and/or diagnosis of SARS-CoV-2 by FDA under an Emergency Use Authorization (EUA). This EUA will remain in effect (meaning this test can be used) for the duration of the COVID-19 declaration under Section 564(b)(1) of the Act, 21 U.S.C. section 360bbb-3(b)(1), unless the authorization is terminated or revoked.  Performed at [facility], [address] [place], [place] [address]   MRSA Next Gen by PCR, Nasal     Status: None   Collection Time: 12/03/23 11:21 PM   Specimen: Nasal Mucosa; Nasal Swab  Result Value Ref Range Status   MRSA by PCR Next Gen NOT DETECTED NOT DETECTED Final    Comment: (NOTE) The GeneXpert MRSA Assay (FDA approved for NASAL specimens only), is one component of a comprehensive MRSA colonization surveillance program. It is  not intended to diagnose MRSA infection nor to guide or monitor treatment for MRSA infections. Test performance is not FDA approved in patients less than 89 years old. Performed at [facility], [address], [place], [place] [address]     Radiology Studies: CT Angio Chest PE W and/or Wo Contrast Result Date: 12/03/2023 CLINICAL DATA:  Hemoptysis for several days, initial encounter EXAM: CT ANGIOGRAPHY CHEST WITH CONTRAST TECHNIQUE: Multidetector CT imaging of the chest was performed using the standard protocol during bolus administration of intravenous contrast. Multiplanar CT image reconstructions and MIPs were obtained to evaluate the vascular anatomy. RADIATION DOSE REDUCTION: This exam was performed according to the departmental dose-optimization program which includes automated exposure control, adjustment of the mA and/or kV according to patient size and/or use of iterative reconstruction technique. CONTRAST:  60mL OMNIPAQUE  IOHEXOL  350 MG/ML SOLN COMPARISON:  Chest x-ray from earlier in the same day. CT from 10/16/2022 and 11/17/2023 FINDINGS: Cardiovascular: Atherosclerotic  calcifications of the thoracic aorta and its branches are noted. Stable saccular aneurysmal dilatation of the distal thoracic aortic arch is noted extending inferiorly. This measures approximately 4.1 cm in greatest dimension. Descending thoracic aorta shows no aneurysmal dilatation or dissection. The pulmonary artery shows a normal branching pattern bilaterally. No intraluminal filling defect to suggest pulmonary embolism is noted. Mediastinum/Nodes: Thoracic inlet is within normal limits. The esophagus as visualized is unremarkable. Scattered small mediastinal nodes are seen. Lungs/Pleura: Calcified pleural plaques are again seen on the right. Emphysematous changes and areas of scarring are noted within the right lung. Small dependent effusion is noted in the left hemithorax. There is been significant increase in the soft tissue density along the left pleural margin measuring up to 10.3 x 9.5 cm on today's exam. It previously measured 6.7 x 4.3 cm. The nodular areas of enhancement peripherally are again seen. This masslike density displaces the adjacent left upper lobe. Considerable emphysematous changes are seen. Upper Abdomen: Visualized upper abdomen demonstrates multiple gallstones within the gallbladder without complicating factors. Musculoskeletal: Old rib fractures are seen on the left and stable Review of the MIP images confirms the above findings. IMPRESSION: Enlarging soft tissue density in the left apex which has central decreased attenuation but some peripheral nodular areas of enhancement. The significant enlargement over the past 2 weeks suggests that this likely represents an enlarging effusion/hemothorax. The possibility of neoplasm however is not totally excluded. Further workup is recommended. Chronic calcified pleural plaquing on the right. Cholelithiasis without complicating factors. 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 Continued follow-up as previously described No  evidence of pulmonary embolism. Aortic Atherosclerosis (ICD10-I70.0). Electronically Signed   By: Violeta Grey M.D.   On: 12/03/2023 21:05   DG Chest Port 1 View Result Date: 12/03/2023 CLINICAL DATA:  Cough, shortness of breath. EXAM: PORTABLE CHEST 1 VIEW COMPARISON:  Same day. FINDINGS: Stable cardiomediastinal silhouette. Left-sided pacemaker is unchanged. Calcified pleural plaques are noted in right lung consistent with asbestos exposure. Stable large masslike density is noted in left upper lobe concerning for neoplasm or malignancy. Minimal bibasilar subsegmental atelectasis is noted. Bony thorax is unremarkable. IMPRESSION: Stable large masslike density is noted and left lung apex highly concerning for neoplasm or malignancy. Calcified pleural plaques are noted in right lung concerning for asbestos exposure. Minimal bibasilar subsegmental atelectasis. Electronically Signed   By: Rosalene Colon M.D.   On: 12/03/2023 16:34   DG Chest 2 View Result Date: 12/03/2023 CLINICAL DATA:  Hemoptysis. EXAM: CHEST - 2 VIEW  COMPARISON:  Chest radiograph dated 09/19/2022. chest CT dated 11/17/2023. FINDINGS: There is opacification of the left apex concerning for a mass/malignancy. Background of emphysema. Right lung pleural parenchyma calcification. No pneumothorax. The cardiac silhouette is within normal limits. Left pectoral pacemaker device. No acute osseous pathology. IMPRESSION: Opacification of the left apex concerning for a mass/malignancy. Electronically Signed   By: Angus Bark M.D.   On: 12/03/2023 13:46   Scheduled Meds:  atorvastatin   40 mg Oral Daily   Chlorhexidine  Gluconate Cloth  6 each Topical Q2200   guaiFENesin   600 mg Oral BID   insulin  aspart  0-9 Units Subcutaneous Q4H   insulin  glargine-yfgn  15 Units Subcutaneous QHS   levothyroxine   125 mcg Oral Q0600   predniSONE   10 mg Oral Q breakfast   pregabalin   75 mg Oral BID   tamsulosin   0.4 mg Oral QHS   tranexamic acid   500 mg  Nebulization Q8H   traZODone   50 mg Oral QHS   umeclidinium-vilanterol  1 puff Inhalation Daily   Continuous Infusions:  sodium chloride  75 mL/hr at 12/04/23 0546     LOS: 1 day    Time spent: 50 mins    Magdalene School, MD [facility]   If 7PM-7AM, please contact night-coverage

## 2023-12-04 NOTE — Progress Notes (Signed)
 OT Cancellation Note  Patient Details Name: CHRISTOPH SPAULDING MRN: [id number] DOB: February 06, 1941   Cancelled Treatment:     Medical issues prohibited therapy/pending possible thoracentesis.   Sheralyn Dies, OTR/L 12/04/2023, 4:30 PM

## 2023-12-04 NOTE — Assessment & Plan Note (Signed)
 -  Check TSH continue home medications Synthroid at  125 mcg po q day

## 2023-12-05 ENCOUNTER — Inpatient Hospital Stay (HOSPITAL_COMMUNITY)

## 2023-12-05 DIAGNOSIS — R042 Hemoptysis: Secondary | ICD-10-CM | POA: Diagnosis not present

## 2023-12-05 DIAGNOSIS — J449 Chronic obstructive pulmonary disease, unspecified: Secondary | ICD-10-CM | POA: Diagnosis not present

## 2023-12-05 LAB — CBC
HCT: 36.5 % — ABNORMAL LOW (ref 39.0–52.0)
Hemoglobin: 11.1 g/dL — ABNORMAL LOW (ref 13.0–17.0)
MCH: 28.4 pg (ref 26.0–34.0)
MCHC: 30.4 g/dL (ref 30.0–36.0)
MCV: 93.4 fL (ref 80.0–100.0)
Platelets: 152 10*3/uL (ref 150–400)
RBC: 3.91 MIL/uL — ABNORMAL LOW (ref 4.22–5.81)
RDW: 14.6 % (ref 11.5–15.5)
WBC: 10.9 10*3/uL — ABNORMAL HIGH (ref 4.0–10.5)
nRBC: 0 % (ref 0.0–0.2)

## 2023-12-05 LAB — GLUCOSE, CAPILLARY
Glucose-Capillary: 127 mg/dL — ABNORMAL HIGH (ref 70–99)
Glucose-Capillary: 128 mg/dL — ABNORMAL HIGH (ref 70–99)
Glucose-Capillary: 120 mg/dL — ABNORMAL HIGH (ref 70–99)
Glucose-Capillary: 175 mg/dL — ABNORMAL HIGH (ref 70–99)
Glucose-Capillary: 258 mg/dL — ABNORMAL HIGH (ref 70–99)
Glucose-Capillary: 247 mg/dL — ABNORMAL HIGH (ref 70–99)

## 2023-12-05 LAB — BASIC METABOLIC PANEL WITH GFR
Anion gap: 10 (ref 5–15)
BUN: 62 mg/dL — ABNORMAL HIGH (ref 8–23)
CO2: 24 mmol/L (ref 22–32)
Calcium: 8.9 mg/dL (ref 8.9–10.3)
Chloride: 106 mmol/L (ref 98–111)
Creatinine, Ser: 1.96 mg/dL — ABNORMAL HIGH (ref 0.61–1.24)
GFR, Estimated: 34 mL/min — ABNORMAL LOW (ref >60)
Glucose, Bld: 150 mg/dL — ABNORMAL HIGH (ref 70–99)
Potassium: 4.1 mmol/L (ref 3.5–5.1)
Sodium: 140 mmol/L (ref 135–145)

## 2023-12-05 LAB — PHOSPHORUS: Phosphorus: 3.6 mg/dL (ref 2.5–4.6)

## 2023-12-05 LAB — MAGNESIUM: Magnesium: 2.1 mg/dL (ref 1.7–2.4)

## 2023-12-05 MED ORDER — LACTATED RINGERS IV BOLUS
500.0000 mL | Freq: Once | INTRAVENOUS | Status: AC
  Administered 2023-12-05: 500 mL via INTRAVENOUS

## 2023-12-05 NOTE — Progress Notes (Signed)
 PT Cancellation Note  Patient Details Name: [name] MRN: [id number] DOB: 12/17/1940   Cancelled Treatment:     PT order received but eval deferred - pt states "too much going on right now and its enough work just to breathe right now."  Will follow.   [name] 12/05/2023, 11:39 AM

## 2023-12-05 NOTE — Progress Notes (Signed)
   12/05/23 2210  BiPAP/CPAP/SIPAP  BiPAP/CPAP/SIPAP Pt Type Adult  BiPAP/CPAP/SIPAP Resmed  Mask Type Full face mask  Dentures removed? Not applicable  Mask Size Medium  Flow Rate 6 lpm  Patient Home Mask No  Patient Home Tubing No  Auto Titrate Yes  Minimum cmH2O 5 cmH2O  Maximum cmH2O 20 cmH2O  Device Plugged into RED Power Outlet Yes  BiPAP/CPAP /SiPAP Vitals  Pulse Rate (!) 39  Resp (!) 21  SpO2 93 %  MEWS Score/Color  MEWS Score 3  MEWS Score Color Yellow

## 2023-12-05 NOTE — Progress Notes (Signed)
 PROGRESS NOTE    [name]  UXL:[id number] DOB: 1941/04/02 DOA: 12/03/2023 PCP: Adelia Homestead, MD   Brief Narrative:  This 83 yrs old male with PMH significant for COPD on 3L of O2, DM2, OSA on CPAP, rheumatoid arthritis, lung nodules , CHF, CAD, CKD, A-fib ,ITP on chronic steroids, left common femoral to popliteal bypass in January 2022, pacemaker, status post autologous stem cell transplant for COPD in 2017, ascending aortic aneurysm presented in the ED with hemoptysis.  Patient reports 2 days ago, He has blood with phlegm now coughing up blood clots. He was seen by pulmonologist who sent him to the ED.  Patient is not on anticoagulation due to ITP.  He is on Plavix  due to CAD.  Patient was hypoxic with SpO2 of 89% on 3 L, improved on 4 L to 94%.  Patient was admitted for further evaluation.  Assessment & Plan:   Principal Problem:   Hemoptysis Active Problems:   Diabetes mellitus with complication, with long-term current use of insulin  (HCC)   COPD (chronic obstructive pulmonary disease) (HCC)   Hypothyroidism   Acute on chronic respiratory failure (HCC)   OSA on CPAP   PAF (paroxysmal atrial fibrillation) (HCC)   Lung mass   Idiopathic thrombocytopenic purpura (ITP) (HCC)   AKI (acute kidney injury) (HCC)   Lung nodules   Pacemaker   Rheumatoid arthritis (HCC)   S/P femoral-tibial bypass   QT prolongation   Pleural effusion    Hemoptysis: Hemothorax : Patient presented with hemoptysis for 2 days. His SpO2 is 88 to 95% on 4 L.  Continue supplemental oxygen . Continue tranexamic acid  nebs.  No further hemoptysis since admission. CT chest shows left upper lobe opacity appears loculated effusion. He fell about a week ago and landed on the left side so this may be hemothorax. Pulmonology plans to do CT guided needle aspiration and send for biopsy.   IR consulted.  Patient underwent CT-guided left chest tube placement. Chest tube connected with the drainage  system.  Fluid sent for cytology and culture Management per Critical care.  COPD: Continue home meds. Appreciated Pulmonology consult.   Diabetes mellitus with hyperglycemia: Continue Semglee  15 units daily. Continue sensitive regular sliding scale. Hold p.o. diabetic medications.   Obtain hemoglobin A1c and TSH   Idiopathic thrombocytopenic purpura (ITP) (HCC) Continue chronic steroids prednisone  10 mg poq day   OSA on CPAP: Continue CPAP at night.   Pacemaker: Chronic,  stable   PAF (paroxysmal atrial fibrillation) (HCC) Resume Coreg  when blood pressure improves. Hold Eliquis  in the setting of hemoptysis.   Rheumatoid arthritis (HCC) Hold arava  for now.   S/P femoral-tibial bypass: Hold Plavix .   Lung nodules: Appreciate pulmonology consult. May need thoracentesis   Acute on chronic respiratory failure [facility]): Patient uses supplemental oxygen  2 L at baseline,  now requiring 4 L likely due to hemoptysis.   Continue supplemental oxygen  and wean as tolerated.[name]  AKI (acute kidney injury) (HCC) > Improving Due to decreased p.o. intake.   Baseline creatinine normal,  presented with creatinine 2.15 Continue IV hydration and monitor serum creatinine.   QT prolongation Avoid QT prolonging medications, rehydrate and correct electrolytes. Repeat EKG.   Hypothyroidism: Continue Synthroid  125 mcg daily.   Lung mass / Loculated Effusion: CT scan showing potential mass although loculated hemorrhage is also possibility.  Appreciate pulmonology consult Pulmonology recommended CT guided needle aspiration and send for biopsy.  IR consulted. Patient underwent CT-guided left chest tube placement. Chest tube connected  with the drainage system.  Fluid sent for cytology and culture Management per Critical care.  DVT prophylaxis: SCDs Code Status: DNR Family Communication: No family at bedside Disposition Plan:    Status is: Inpatient Remains inpatient appropriate because:  Admitted for hemoptysis,  pulmonology is consulted. Patient underwent CT-guided left chest tube placement. Chest tube connected with the drainage system.  Fluid sent for cytology and culture Management per Critical care.   Consultants:  Pulmonology  Procedures:CT chest  Antimicrobials:  Anti-infectives (From admission, onward)    None      Subjective: Seen and examined at bedside.  Overnight events noted. Patient reports feeling better, Patient underwent CT-guided left chest tube placement 5/9. Chest tube connected with the drainage system.  Fluid sent for cytology and culture  Objective: Vitals:   12/05/23 0400 12/05/23 0500 12/05/23 0600 12/05/23 0859  BP: 125/60 (!) 156/96 (!) 145/71   Pulse: (!) 59 (!) 39 61   Resp: 16 19 (!) 21   Temp: 97.7 F (36.5 C)   (!) 97.5 F (36.4 C)  TempSrc: Oral   Oral  SpO2: 97% (!) 79% 95%   Weight:      Height:        Intake/Output Summary (Last 24 hours) at 12/05/2023 1039 Last data filed at 12/05/2023 0946 Gross per 24 hour  Intake 500.16 ml  Output 1850 ml  Net -1349.84 ml   Filed Weights   12/03/23 1515 12/03/23 2321  Weight: 86.2 kg 82.6 kg    Examination:  General exam: Appears calm and comfortable, deconditioned, not in any acute distress. Respiratory system: Decreased breath sounds. Respiratory effort normal.  RR 16. Chest wall: Chest tube connected on left side with bloody fluid Cardiovascular system: S1 & S2 heard, RRR. No JVD, murmurs, rubs, gallops or clicks.  Gastrointestinal system: Abdomen is non distended, soft and non tender. Normal bowel sounds heard. Central nervous system: Alert and oriented x 3. No focal neurological deficits. Extremities: No edema, no cyanosis, no clubbing. Skin: No rashes, lesions or ulcers Psychiatry: Judgement and insight appear normal. Mood & affect appropriate.     Data Reviewed: I have personally reviewed following labs and imaging studies  CBC: Recent Labs  Lab  12/01/23 1337 12/03/23 1607 12/04/23 0032 12/05/23 0303  WBC 10.9* 11.3* 9.9 10.9*  NEUTROABS 8.9*  --   --   --   HGB 12.4* 11.4* 12.0* 11.1*  HCT 37.8* 36.7* 39.3 36.5*  MCV 86.1 90.8 92.5 93.4  PLT 150 150 147* 152   Basic Metabolic Panel: Recent Labs  Lab 12/03/23 1755 12/04/23 0032 12/04/23 0835 12/05/23 0303  NA 134* 131*  --  140  K 4.5 5.2* 4.8 4.1  CL 98 96*  --  106  CO2 22 19*  --  24  GLUCOSE 152* 252*  --  150*  BUN 52* 57*  --  62*  CREATININE 2.12* 2.15*  --  1.96*  CALCIUM  8.5* 8.6*  --  8.9  MG 1.7 1.8  --  2.1  PHOS 4.3 5.9*  --  3.6   GFR: Estimated Creatinine Clearance: 33.8 mL/min (A) (by C-G formula based on SCr of 1.96 mg/dL (H)). Liver Function Tests: Recent Labs  Lab 12/03/23 1755 12/04/23 0032  AST 24 24  ALT 16 17  ALKPHOS 46 53  BILITOT 1.1 1.6*  PROT 6.6 7.0  ALBUMIN  3.5 3.4*   No results for input(s): "LIPASE", "AMYLASE" in the last 168 hours. No results for input(s): "AMMONIA" in the  last 168 hours. Coagulation Profile: Recent Labs  Lab 12/04/23 0032  INR 1.1   Cardiac Enzymes: Recent Labs  Lab 12/04/23 0032  CKTOTAL 56   BNP (last 3 results) No results for input(s): "PROBNP" in the last 8760 hours. HbA1C: Recent Labs    12/04/23 0032  HGBA1C 8.6*   CBG: Recent Labs  Lab 12/04/23 2033 12/04/23 2322 12/05/23 0504 12/05/23 0633 12/05/23 0745  GLUCAP 216* 183* 127* 128* 120*   Lipid Profile: No results for input(s): "CHOL", "HDL", "LDLCALC", "TRIG", "CHOLHDL", "LDLDIRECT" in the last 72 hours. Thyroid  Function Tests: Recent Labs    12/04/23 0032  TSH 4.127   Anemia Panel: No results for input(s): "VITAMINB12", "FOLATE", "FERRITIN", "TIBC", "IRON ", "RETICCTPCT" in the last 72 hours. Sepsis Labs: Recent Labs  Lab 12/03/23 1755  PROCALCITON 0.13    Recent Results (from the past 240 hours)  Resp panel by RT-PCR (RSV, Flu A&B, Covid) Anterior Nasal Swab     Status: None   Collection Time: 12/03/23   4:07 PM   Specimen: Anterior Nasal Swab  Result Value Ref Range Status   SARS Coronavirus 2 by RT PCR NEGATIVE NEGATIVE Final    Comment: (NOTE) SARS-CoV-2 target nucleic acids are NOT DETECTED.  The SARS-CoV-2 RNA is generally detectable in upper respiratory specimens during the acute phase of infection. The lowest concentration of SARS-CoV-2 viral copies this assay can detect is 138 copies/mL. A negative result does not preclude SARS-Cov-2 infection and should not be used as the sole basis for treatment or other patient management decisions. A negative result may occur with  improper specimen collection/handling, submission of specimen other than nasopharyngeal swab, presence of viral mutation(s) within the areas targeted by this assay, and inadequate number of viral copies(<138 copies/mL). A negative result must be combined with clinical observations, patient history, and epidemiological information. The expected result is Negative.  Fact Sheet for Patients:  BloggerCourse.com  Fact Sheet for Healthcare Providers:  SeriousBroker.it  This test is no t yet approved or cleared by the [place]  FDA and  has been authorized for detection and/or diagnosis of SARS-CoV-2 by FDA under an Emergency Use Authorization (EUA). This EUA will remain  in effect (meaning this test can be used) for the duration of the COVID-19 declaration under Section 564(b)(1) of the Act, 21 U.S.C.section 360bbb-3(b)(1), unless the authorization is terminated  or revoked sooner.       Influenza A by PCR NEGATIVE NEGATIVE Final   Influenza B by PCR NEGATIVE NEGATIVE Final    Comment: (NOTE) The Xpert Xpress SARS-CoV-2/FLU/RSV plus assay is intended as an aid in the diagnosis of influenza from Nasopharyngeal swab specimens and should not be used as a sole basis for treatment. Nasal washings and aspirates are unacceptable for Xpert Xpress  SARS-CoV-2/FLU/RSV testing.  Fact Sheet for Patients: BloggerCourse.com  Fact Sheet for Healthcare Providers: SeriousBroker.it  This test is not yet approved or cleared by the [place]  FDA and has been authorized for detection and/or diagnosis of SARS-CoV-2 by FDA under an Emergency Use Authorization (EUA). This EUA will remain in effect (meaning this test can be used) for the duration of the COVID-19 declaration under Section 564(b)(1) of the Act, 21 U.S.C. section 360bbb-3(b)(1), unless the authorization is terminated or revoked.     Resp Syncytial Virus by PCR NEGATIVE NEGATIVE Final    Comment: (NOTE) Fact Sheet for Patients: BloggerCourse.com  Fact Sheet for Healthcare Providers: SeriousBroker.it  This test is not yet approved or cleared by the [place]  [place] FDA and has been authorized for detection and/or diagnosis of SARS-CoV-2 by FDA under an Emergency Use Authorization (EUA). This EUA will remain in effect (meaning this test can be used) for the duration of the COVID-19 declaration under Section 564(b)(1) of the Act, 21 U.S.C. section 360bbb-3(b)(1), unless the authorization is terminated or revoked.  Performed at [facility], [address] [place], [place] [address]   MRSA Next Gen by PCR, Nasal     Status: None   Collection Time: 12/03/23 11:21 PM   Specimen: Nasal Mucosa; Nasal Swab  Result Value Ref Range Status   MRSA by PCR Next Gen NOT DETECTED NOT DETECTED Final    Comment: (NOTE) The GeneXpert MRSA Assay (FDA approved for NASAL specimens only), is one component of a comprehensive MRSA colonization surveillance program. It is not intended to diagnose MRSA infection nor to guide or monitor treatment for MRSA infections. Test performance is not FDA approved in patients less than 63 years old. Performed at [facility], [address], [place], [place] [address]   Aerobic/Anaerobic Culture w Gram Stain (surgical/deep wound)     Status: None (Preliminary result)   Collection Time: 12/04/23  4:45 PM   Specimen: Pleural Fluid  Result Value Ref Range Status   Specimen Description   Final    PLEURAL LEFT Performed at [facility], [address] [place], [place] [address]    Special Requests   Final    NONE Performed at [facility], [address], [place], [place] [address]    Gram Stain   Final    ABUNDANT WBC PRESENT, PREDOMINANTLY PMN NO ORGANISMS SEEN    Culture   Final    NO GROWTH < 12 HOURS Performed at [facility], [address], [place], [place] [address]    Report Status PENDING  Incomplete    Radiology Studies: CT GUIDED PERITONEAL/RETROPERITONEAL FLUID DRAIN BY PERC CATH Result Date: 12/04/2023 INDICATION: 83 year old with an enlarging collection in left upper chest concerning for a complex hemothorax. EXAM: CT-GUIDED PLACEMENT OF LEFT CHEST TUBE TECHNIQUE: Multidetector CT imaging of the chest was performed following the standard protocol without IV contrast. RADIATION DOSE REDUCTION: This exam was performed according to the departmental dose-optimization program which includes automated exposure control, adjustment of the mA and/or kV according to patient size and/or use of iterative reconstruction technique. MEDICATIONS: Fentanyl  50 mcg ANESTHESIA/SEDATION: The patient's level of consciousness and vital signs were monitored continuously by radiology nursing throughout the procedure under my direct supervision. COMPLICATIONS: None immediate. PROCEDURE: Informed written consent was obtained from the patient after a thorough discussion of the procedural risks, benefits and alternatives. All questions were addressed. Maximal Sterile Barrier Technique was utilized including caps, mask, sterile gowns, sterile gloves, sterile drape, hand hygiene and skin  antiseptic. A timeout was performed prior to the initiation of the procedure. Patient was placed on his right side. Ultrasound demonstrated that the collection in the left upper chest was a complex fluid collection. CT images through the chest were obtained. The complex pleural-based collection in the left upper chest was identified. The lateral left upper chest was prepped with chlorhexidine  and sterile field was created. Skin was anesthetized using 1% lidocaine . Using CT guidance, a Yueh catheter was directed into the collection and dark blood was aspirated. Superstiff Amplatz wire was advanced through the Yueh catheter into the collection. The tract was dilated to accommodate a 12 [place] multipurpose drain. The drain  was attached to a chest tube drainage system. Fluid was sent for culture and cytology. Chest tube was sutured to skin. Dressing was placed. FINDINGS: Complex hemothorax in the posterolateral left upper chest. Drain was placed from a lateral approach. Large amount of dark bloody fluid was aspirated. IMPRESSION: CT-guided placement of a chest tube in the left upper chest collection. Findings are suggestive for a hemothorax. Electronically Signed   By: Elene Griffes M.D.   On: 12/04/2023 17:47   CT Angio Chest PE W and/or Wo Contrast Result Date: 12/03/2023 CLINICAL DATA:  Hemoptysis for several days, initial encounter EXAM: CT ANGIOGRAPHY CHEST WITH CONTRAST TECHNIQUE: Multidetector CT imaging of the chest was performed using the standard protocol during bolus administration of intravenous contrast. Multiplanar CT image reconstructions and MIPs were obtained to evaluate the vascular anatomy. RADIATION DOSE REDUCTION: This exam was performed according to the departmental dose-optimization program which includes automated exposure control, adjustment of the mA and/or kV according to patient size and/or use of iterative reconstruction technique. CONTRAST:  60mL OMNIPAQUE  IOHEXOL  350 MG/ML SOLN COMPARISON:   Chest x-ray from earlier in the same day. CT from 10/16/2022 and 11/17/2023 FINDINGS: Cardiovascular: Atherosclerotic calcifications of the thoracic aorta and its branches are noted. Stable saccular aneurysmal dilatation of the distal thoracic aortic arch is noted extending inferiorly. This measures approximately 4.1 cm in greatest dimension. Descending thoracic aorta shows no aneurysmal dilatation or dissection. The pulmonary artery shows a normal branching pattern bilaterally. No intraluminal filling defect to suggest pulmonary embolism is noted. Mediastinum/Nodes: Thoracic inlet is within normal limits. The esophagus as visualized is unremarkable. Scattered small mediastinal nodes are seen. Lungs/Pleura: Calcified pleural plaques are again seen on the right. Emphysematous changes and areas of scarring are noted within the right lung. Small dependent effusion is noted in the left hemithorax. There is been significant increase in the soft tissue density along the left pleural margin measuring up to 10.3 x 9.5 cm on today's exam. It previously measured 6.7 x 4.3 cm. The nodular areas of enhancement peripherally are again seen. This masslike density displaces the adjacent left upper lobe. Considerable emphysematous changes are seen. Upper Abdomen: Visualized upper abdomen demonstrates multiple gallstones within the gallbladder without complicating factors. Musculoskeletal: Old rib fractures are seen on the left and stable Review of the MIP images confirms the above findings. IMPRESSION: Enlarging soft tissue density in the left apex which has central decreased attenuation but some peripheral nodular areas of enhancement. The significant enlargement over the past 2 weeks suggests that this likely represents an enlarging effusion/hemothorax. The possibility of neoplasm however is not totally excluded. Further workup is recommended. Chronic calcified pleural plaquing on the right. Cholelithiasis without complicating  factors. 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 Continued follow-up as previously described No evidence of pulmonary embolism. Aortic Atherosclerosis (ICD10-I70.0). Electronically Signed   By: Violeta Grey M.D.   On: 12/03/2023 21:05   DG Chest Port 1 View Result Date: 12/03/2023 CLINICAL DATA:  Cough, shortness of breath. EXAM: PORTABLE CHEST 1 VIEW COMPARISON:  Same day. FINDINGS: Stable cardiomediastinal silhouette. Left-sided pacemaker is unchanged. Calcified pleural plaques are noted in right lung consistent with asbestos exposure. Stable large masslike density is noted in left upper lobe concerning for neoplasm or malignancy. Minimal bibasilar subsegmental atelectasis is noted. Bony thorax is unremarkable. IMPRESSION: Stable large masslike density is noted and left lung apex highly concerning for neoplasm or malignancy. Calcified pleural plaques are noted in right lung concerning for asbestos exposure. Minimal  bibasilar subsegmental atelectasis. Electronically Signed   By: Rosalene Colon M.D.   On: 12/03/2023 16:34   DG Chest 2 View Result Date: 12/03/2023 CLINICAL DATA:  Hemoptysis. EXAM: CHEST - 2 VIEW COMPARISON:  Chest radiograph dated 09/19/2022. chest CT dated 11/17/2023. FINDINGS: There is opacification of the left apex concerning for a mass/malignancy. Background of emphysema. Right lung pleural parenchyma calcification. No pneumothorax. The cardiac silhouette is within normal limits. Left pectoral pacemaker device. No acute osseous pathology. IMPRESSION: Opacification of the left apex concerning for a mass/malignancy. Electronically Signed   By: Angus Bark M.D.   On: 12/03/2023 13:46   Scheduled Meds:  atorvastatin   40 mg Oral Daily   Chlorhexidine  Gluconate Cloth  6 each Topical Q2200   guaiFENesin   600 mg Oral BID   insulin  aspart  0-9 Units Subcutaneous Q4H   insulin  glargine-yfgn  15 Units Subcutaneous QHS   levothyroxine   125 mcg Oral Q0600    multivitamin with minerals  1 tablet Oral Daily   predniSONE   10 mg Oral Q breakfast   pregabalin   75 mg Oral BID   Ensure Max Protein  11 oz Oral BID   tamsulosin   0.4 mg Oral QHS   tranexamic acid   500 mg Nebulization Q8H   traZODone   50 mg Oral QHS   umeclidinium-vilanterol  1 puff Inhalation Daily   Continuous Infusions:     LOS: 2 days    Time spent: 50 mins    Magdalene School, MD [facility]   If 7PM-7AM, please contact night-coverage

## 2023-12-05 NOTE — Progress Notes (Addendum)
     Patient Name: [name]           DOB: April 07, 1941  MRN: [id number]      Admission Date: 12/03/2023  Attending Provider: Magdalene School, MD  Primary Diagnosis: Hemoptysis   Level of care: Stepdown   OVERNIGHT PROGRESS REPORT   Sudden drop in BP, 70 /40s.  However, he remains asymptomatic, alert and oriented.  Afebrile, without other acute changes. Patient denies dizziness, lightheadedness, chest pain, palpitations, SOB, abdominal discomfort, nausea, vomiting. After being placed in Trendelenburg position, BP slightly improves 90/40s.  Will place order for 500 cc LR bolus. No hemoptysis reported tonight.  Left chest tube having minimal dark bloody fluid.  Last hemoglobin 12.0, will recheck CBC.    Plan- LR bolus CBC   Addendum- BP improvement after bolus.    Reiana Poteet, DNP, ACNPC- AG [facility] [place]

## 2023-12-05 NOTE — Progress Notes (Signed)
 NAME:  [name], MRN:  [id number], DOB:  [birth year], LOS: 2 ADMISSION DATE:  12/03/2023, CONSULTATION DATE: 5/8 REFERRING MD: Dr. Tamela Fake, CHIEF COMPLAINT: Hemoptysis  History of Present Illness:  83 year old male with multiple medical problems as outlined below which are significant for severe COPD, chronic hypoxic respiratory failure on 2 L at home, obstructive sleep apnea on CPAP, rheumatoid arthritis, and atrial fibrillation.  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  He presented to the pulmonary clinic on 5/8 with complaints of hemoptysis.  Chest x-ray showed dense consolidation versus effusion versus atelectasis of the left upper lobe.  CT angiogram on 4/22 showed a loculated effusion in that location.  Patient complains of cough with large chunks of bright red blood but not lower than 1/4 cup. He was felt to be high risk of decompensation and was referred to the ED for further care. PCCM was consulted.   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), Anxiety, Aortic aneurysm (HCC) (09/02/2017), BENIGN PROSTATIC HYPERTROPHY (11/23/2009), Cardiomyopathy (HCC) (08/28/2016), Chronic systolic CHF (congestive heart failure) (HCC) (09/02/2017), COPD (chronic obstructive pulmonary disease) (HCC), CORONARY ARTERY DISEASE (11/23/2009), DECREASED HEARING, LEFT EAR (03/01/2010), DEGENERATIVE JOINT DISEASE (11/23/2009), DEPRESSION (11/23/2009), FATIGUE (11/23/2009), GAIT DISTURBANCE (12/10/2009), HEMOPTYSIS UNSPECIFIED (05/07/2010), High cholesterol, HYPERTENSION (07/30/2009), HYPOTHYROIDISM (07/30/2009), Ischemic cardiomyopathy (09/02/2017), LUMBAR RADICULOPATHY, 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   Significant Hospital Events: Including procedures, antibiotic start and stop dates in addition to other pertinent events   5/8 admit 5/9 CT guided chest tube placed by IR, aspirated blood concerning for hemothorax  Interim History / Subjective:   Chest tube drained since placement  Patient reports his breathing feels at baseline  Objective    Blood pressure (!) 145/71, pulse 61, temperature (!) 97.5 F (36.4 C), temperature source Oral, resp. rate (!) 21, height 6\' 2"  (1.88 m), weight 82.6 kg, SpO2 95%.        Intake/Output Summary (Last 24 hours) at 12/05/2023 1014 Last data filed at 12/05/2023 0946 Gross per 24 hour  Intake 839.99 ml  Output 1850 ml  Net -1010.01 ml   Filed Weights   12/03/23 1515 12/03/23 2321  Weight: 86.2 kg 82.6 kg    Examination: General: Elderly gentleman in NAD HENT: [place]/AT, no JVD Lungs: Clear bilateral breath sounds. No wheeze Cardiovascular: RRR, no MRG Abdomen: Soft, NT, ND Extremities: No acute deformity or ROM limitation Neuro: Alert, oriented, non-focal  Resolved Hospital Problem list:    Assessment & Plan:   Hemoptysis - no further episodes since admission - Supplemental O2 as needed to keep O2 sats 88-95% - TXA nebs x 3, then stop  LUL opacity: - CT showing what looks like a loculated pleural effusion adjacent to the left upper lobe. Differential includes hemothorax following falls vs mass/hemorrhage - Discussed with IR, CT guided chest tube placed 5/9 - repeat CXR today, will consider pleural lytics - Hold plavix , last dose 5/8 AM  COPD without acute exacerbation OSA on CPAP Chronic hypoxemic respiratory failure on 2-3L home oxygen  therapy - Sat goal 88-95%, supplemental O2 titrated to goal.  Reduced to 3L today.  - at bedtime  CPAP - Anoro in place of home stiolto  Remainder per primary   Best Practice (right click and "Reselect all SmartList Selections" daily)   Per primary  Critical care time:     Duaine German, MD [facility] Office: [phone]   See Amion for personal pager PCCM on call pager [phone] until 7pm. Please call Elink 7p-7a. [phone]

## 2023-12-05 NOTE — Progress Notes (Signed)
 Referring Provider(s): Mr. Roz Cornelia, NP  Supervising Physician: Erica Hau  Patient Status:  [facility] - In-pt  Chief Complaint: Hemoptysis.  Subjective:  Patient alert and laying in bed,calm.  Currently without any significant complaints.  Denies any fevers, headache, chest pain, SOB, cough, abdominal pain, nausea, vomiting or bleeding.    Allergies: Patient has no known allergies.  Medications: Prior to Admission medications   Medication Sig Start Date End Date Taking? Authorizing Provider  acetaminophen  (TYLENOL ) 500 MG tablet Take 1,000 mg by mouth every 6 (six) hours as needed for mild pain.   Yes [provider]  albuterol  (PROVENTIL ) (2.5 MG/3ML) 0.083% nebulizer solution Take 3 mLs (2.5 mg total) by nebulization every 6 (six) hours as needed for wheezing or shortness of breath. 10/25/20  Yes Parrett, Tammy S, NP  albuterol  (VENTOLIN  HFA) 108 (90 Base) MCG/ACT inhaler INHALE 2 PUFFS INTO THE LUNGS EVERY 6 HOURS AS NEEDED FOR WHEEZING OR SHORTNESS OF BREATH 04/17/23  Yes Parrett, Tammy S, NP  atorvastatin  (LIPITOR ) 40 MG tablet Take 1 tablet (40 mg total) by mouth daily. 10/21/16  Yes Elmyra Haggard, MD  Carboxymethylcellul-Glycerin (LUBRICATING EYE DROPS OP) Place 1 drop into both eyes 4 (four) times daily as needed (dry eyes).    Yes [provider]  carvedilol  (COREG ) 3.125 MG tablet TAKE 1 TABLET(3.125 MG) BY MOUTH TWICE DAILY WITH A MEAL Patient taking differently: Take 3.125 mg by mouth 2 (two) times daily with a meal. 07/20/23  Yes Adelia Homestead, MD  Cholecalciferol  (VITAMIN D3) 2000 units capsule Take 2,000 Units by mouth daily.  01/11/16  Yes [provider]  clopidogrel  (PLAVIX ) 75 MG tablet TAKE 1 TABLET(75 MG) BY MOUTH DAILY Patient taking differently: Take 75 mg by mouth daily. 09/08/23  Yes Young Hensen, MD  Continuous Blood Gluc Sensor (DEXCOM G7 SENSOR) MISC Use to check blood sugars 3x a day. DX CODE: E11.69,  E78.5 Patient taking differently: Inject 1 Device into the skin See admin instructions. Change sensor every 10 days. 08/20/22  Yes Adelia Homestead, MD  cyanocobalamin  (VITAMIN B12) 500 MCG tablet Take 500 mcg by mouth daily. 07/30/22  Yes [provider]  Empagliflozin-metFORMIN  HCl ER 25-1000 MG TB24 Take 1 tablet by mouth daily at 12 noon.   Yes [provider]  ferrous sulfate  324 (65 Fe) MG TBEC Take 324 mg by mouth daily. 10/02/21  Yes [provider]  fluticasone  (FLONASE ) 50 MCG/ACT nasal spray Place 1 spray into both nostrils daily. Patient taking differently: Place 1 spray into both nostrils daily as needed for allergies. 07/17/21  Yes Cobb, Mariah Shines, NP  guaifenesin  (HUMIBID E) 400 MG TABS tablet Take 400 mg by mouth in the morning and at bedtime. 05/23/23  Yes [provider]  imiquimod (ALDARA) 5 % cream Apply 1 Application topically 3 (three) times a week. APPLY TO WARTS 3-5 TIMES PER WEEK FOR 6 WEEKS OR UNTIL WARTS DISAPPEAR 12/08/22  Yes [provider]  insulin  aspart (NOVOLOG ) 100 UNIT/ML injection Inject 0-12 Units into the skin 3 (three) times daily before meals.   Yes [provider]  insulin  glargine (LANTUS ) 100 UNIT/ML injection Inject 20 Units into the skin daily.   Yes [provider]  levothyroxine  (SYNTHROID ) 125 MCG tablet Take 125 mcg by mouth daily before breakfast. 12/07/22  Yes [provider]  losartan  (COZAAR ) 100 MG tablet Take 1 tablet (100 mg total) by mouth daily. 12/24/22  Yes Lenise Quince, MD  metformin  (FORTAMET ) 500 MG (OSM) 24 hr tablet Take 500 mg by mouth at bedtime.   Yes [provider]  Multiple Vitamins-Minerals (MEGA MULTIVITAMIN FOR MEN PO) Take 1 tablet by mouth daily.   Yes [provider]  Olodaterol HCl 2.5 MCG/ACT AERS Inhale 2 puffs into the lungs daily at 12 noon.   Yes [provider]  OXYGEN  Inhale 2 L into the lungs continuous.   Yes  [provider]  predniSONE  (DELTASONE ) 10 MG tablet Take 1 tablet (10 mg total) by mouth daily with breakfast. 12/01/23  Yes Cameron Cea, MD  pregabalin  (LYRICA ) 100 MG capsule Take 100 mg by mouth daily.   Yes [provider]  Semaglutide  (OZEMPIC , 1 MG/DOSE, [place]) Inject 1 mg into the skin every Friday.   Yes [provider]  sertraline  (ZOLOFT ) 100 MG tablet Take 50 mg by mouth daily. 11/06/22  Yes [provider]  tamsulosin  (FLOMAX ) 0.4 MG CAPS capsule Take 1 capsule (0.4 mg total) by mouth at bedtime. 09/14/17  Yes Jobe Mulder, DO  traZODone  (DESYREL ) 50 MG tablet Take 50 mg by mouth at bedtime. 11/06/22  Yes [provider]  empagliflozin (JARDIANCE) 10 MG TABS tablet Take 5 mg by mouth daily. Patient not taking: Reported on 12/04/2023    [provider]  Tiotropium Bromide -Olodaterol (STIOLTO RESPIMAT ) 2.5-2.5 MCG/ACT AERS Inhale 2 puffs into the lungs daily. Patient not taking: Reported on 12/04/2023 03/01/21   Drema Genta, NP     Vital Signs: BP (!) 145/71   Pulse 61   Temp (!) 97.5 F (36.4 C) (Oral)   Resp (!) 21   Ht 6\' 2"  (1.88 m)   Wt 182 lb 1.6 oz (82.6 kg)   SpO2 95%   BMI 23.38 kg/m   Physical Exam Constitutional:      Appearance: Normal appearance.  Cardiovascular:     Rate and Rhythm: Normal rate.  Pulmonary:     Effort: Pulmonary effort is normal.  Musculoskeletal:        General: Normal range of motion.     Comments: Chest tube drain appropriately dressed. Dressing is clean, dry, intact. Drain incision minimally tender, without evidence of infection. Retaining suture in place.  Good bloody output in collection cassette, to suction.    Skin:    General: Skin is warm and dry.  Neurological:     Mental Status: He is alert and oriented to person, place, and time.      Labs:  CBC: Recent Labs    12/01/23 1337 12/03/23 1607 12/04/23 0032 12/05/23 0303  WBC 10.9* 11.3* 9.9 10.9*  HGB  12.4* 11.4* 12.0* 11.1*  HCT 37.8* 36.7* 39.3 36.5*  PLT 150 150 147* 152    COAGS: Recent Labs    12/04/23 0032  INR 1.1    BMP: Recent Labs    01/02/23 0837 11/17/23 1538 12/03/23 1755 12/04/23 0032 12/04/23 0835 12/05/23 0303  NA 140  --  134* 131*  --  140  K 4.7  --  4.5 5.2* 4.8 4.1  CL 104  --  98 96*  --  106  CO2 23  --  22 19*  --  24  GLUCOSE 120*  --  152* 252*  --  150*  BUN 27  --  52* 57*  --  62*  CALCIUM  9.0  --  8.5* 8.6*  --  8.9  CREATININE 1.19 1.70* 2.12* 2.15*  --  1.96*  GFRNONAA  --   --  30* 30*  --  34*    LIVER FUNCTION TESTS: Recent Labs    01/02/23 0837 12/03/23 1755 12/04/23 0032  BILITOT 0.5 1.1 1.6*  AST 18 24 24   ALT 14 16 17   ALKPHOS 83 46 53  PROT 6.2 6.6 7.0  ALBUMIN  4.1 3.5 3.4*    Assessment and Plan:  Patient is s/p lytics today.  Chest tube drain appropriately dressed. Dressing is clean, dry, intact. Drain incision minimally tender, without evidence of infection. Retaining suture in place.  1150 cc bloody output in collection cassette, to suction.   IR will continue to follow.   Thank you for this interesting consult.  I greatly enjoyed meeting LESSLIE FOREST and look forward to participating in their care.   Electronically Signed: Lovena Rubinstein, PA-C 12/05/2023, 4:07 PM     I spent a total of 15 Minutes at the the patient's bedside AND on the patient's hospital floor or unit, greater than 50% of which was counseling/coordinating care for hemoptysis, status post chest tube placement.

## 2023-12-05 NOTE — Progress Notes (Signed)
 OT Cancellation Note  Patient Details Name: [name] MRN: [id number] DOB: February 16, 1941   Cancelled Treatment:    Reason Eval/Treat Not Completed: Fatigue/lethargy limiting ability to participate  Pt said he was not up for this today.  Explained benefits of OOB and OT- pt declined at this time. Will check on pt next day  Jimia Gentles, Berline Brenner 12/05/2023, 11:46 AM

## 2023-12-06 ENCOUNTER — Inpatient Hospital Stay (HOSPITAL_COMMUNITY)

## 2023-12-06 DIAGNOSIS — R042 Hemoptysis: Secondary | ICD-10-CM | POA: Diagnosis not present

## 2023-12-06 DIAGNOSIS — E440 Moderate protein-calorie malnutrition: Secondary | ICD-10-CM | POA: Insufficient documentation

## 2023-12-06 DIAGNOSIS — J449 Chronic obstructive pulmonary disease, unspecified: Secondary | ICD-10-CM | POA: Diagnosis not present

## 2023-12-06 LAB — CBC
HCT: 40.5 % (ref 39.0–52.0)
Hemoglobin: 12.6 g/dL — ABNORMAL LOW (ref 13.0–17.0)
MCH: 28.7 pg (ref 26.0–34.0)
MCHC: 31.1 g/dL (ref 30.0–36.0)
MCV: 92.3 fL (ref 80.0–100.0)
Platelets: 171 10*3/uL (ref 150–400)
RBC: 4.39 MIL/uL (ref 4.22–5.81)
RDW: 14.6 % (ref 11.5–15.5)
WBC: 9.8 10*3/uL (ref 4.0–10.5)
nRBC: 0 % (ref 0.0–0.2)

## 2023-12-06 LAB — GLUCOSE, CAPILLARY
Glucose-Capillary: 129 mg/dL — ABNORMAL HIGH (ref 70–99)
Glucose-Capillary: 164 mg/dL — ABNORMAL HIGH (ref 70–99)
Glucose-Capillary: 221 mg/dL — ABNORMAL HIGH (ref 70–99)
Glucose-Capillary: 240 mg/dL — ABNORMAL HIGH (ref 70–99)
Glucose-Capillary: 311 mg/dL — ABNORMAL HIGH (ref 70–99)
Glucose-Capillary: 96 mg/dL (ref 70–99)

## 2023-12-06 LAB — CREATININE, SERUM
Creatinine, Ser: 1.66 mg/dL — ABNORMAL HIGH (ref 0.61–1.24)
GFR, Estimated: 41 mL/min — ABNORMAL LOW (ref >60)

## 2023-12-06 NOTE — TOC Initial Note (Signed)
 Transition of Care [facility]) - Initial/Assessment Note    Patient Details  Name: [name] MRN: [id number] Date of Birth: 06-12-1941  [facility]) CM/SW Contact:    Amaryllis Junior, LCSW Phone Number: 12/06/2023, 3:26 PM  Clinical Narrative:                 Pt from home alone. Pt recommended for HHPT/OT. HHPT/OT setup with [facility] Pt continues medical workup. TOC following for additional dc needs.   Expected Discharge Plan: Home w Home Health Services Barriers to Discharge: Continued Medical Work up   Patient Goals and CMS Choice Patient states their goals for this hospitalization and ongoing recovery are:: return home CMS Medicare.gov Compare Post Acute Care list provided to:: Patient Choice offered to / list presented to : Patient [place] ownership interest in [facility].provided to:: Patient    Expected Discharge Plan and Services In-house Referral: NA   Post Acute Care Choice: Home Health Living arrangements for the past 2 months: Single Family Home                 DME Arranged: N/A DME Agency: NA       HH Arranged: OT, PT HH Agency: [facility] Date [facility] Agency Contacted: 12/06/23 Time HH Agency Contacted: 1525 Representative spoke with at [facility] Agency: Randel Buss  Prior Living Arrangements/Services Living arrangements for the past 2 months: Single Family Home Lives with:: Self Patient language and need for interpreter reviewed:: Yes Do you feel safe going back to the place where you live?: Yes      Need for Family Participation in Patient Care: Yes (Comment) Care giver support system in place?: Yes (comment)   Criminal Activity/Legal Involvement Pertinent to Current Situation/Hospitalization: No - Comment as needed  Activities of Daily Living   ADL Screening (condition at time of admission) Independently performs ADLs?: Yes (appropriate for developmental age) Is the patient deaf or have difficulty hearing?: Yes Does the  patient have difficulty seeing, even when wearing glasses/contacts?: Yes Does the patient have difficulty concentrating, remembering, or making decisions?: No  Permission Sought/Granted                  Emotional Assessment Appearance:: Appears stated age Attitude/Demeanor/Rapport: Engaged Affect (typically observed): Accepting Orientation: : Oriented to Self, Oriented to Situation, Oriented to  Time, Oriented to Place Alcohol  / Substance Use: Not Applicable Psych Involvement: No (comment)  Admission diagnosis:  Hemoptysis [R04.2] Patient Active Problem List   Diagnosis Date Noted   Malnutrition of moderate degree 12/06/2023   Pleural effusion 12/04/2023   QT prolongation 12/03/2023   Rib fractures 10/27/2022   Allergic rhinitis 10/27/2022   Coronary artery disease involving coronary bypass graft of native heart with angina pectoris (HCC) 08/15/2022   S/P femoral-tibial bypass 03/04/2022   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   Oxygen  dependent 03/05/2021   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   Obesity 12/11/2020   Osteoarthritis 12/11/2020   Other long term (current) drug therapy 12/11/2020   Thrombocytopenia (HCC) 12/11/2020   Encounter for general adult medical examination with abnormal findings 11/05/2020   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   Claudication (HCC) 07/03/2020   Physical deconditioning 06/08/2020   Hemoptysis 02/16/2020   Syncope 10/04/2019   LBBB (left bundle branch block) 10/04/2019   Pacemaker 10/04/2019   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   Lung nodules 03/25/2019   AKI (acute kidney injury) (HCC)  02/12/2018   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   Symptomatic anemia 01/20/2018   BENIGN PROSTATIC HYPERTROPHY 12/2017   Chronic respiratory failure with hypoxia (HCC) 12/01/2017   Lung mass 12/01/2017   PAF (paroxysmal atrial fibrillation) (HCC)    Ischemic  cardiomyopathy 09/02/2017   Chronic systolic CHF (congestive heart failure) (HCC) 09/02/2017   Aortic aneurysm (HCC) 09/02/2017   Diabetes mellitus with complication, with long-term current use of insulin  (HCC) 09/01/2017   Anxiety and depression 09/01/2017   COPD (chronic obstructive pulmonary disease) (HCC) 09/01/2017   CORONARY ARTERY DISEASE 08/28/2016   Myogenic ptosis of right eyelid 07/11/2016   Open angle with borderline findings, low risk, bilateral 07/11/2016   Bilateral dry eyes 04/14/2014   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/14/2014   Hyperlipidemia associated with type 2 diabetes mellitus (HCC) 01/05/2014   High risk medication use 10/07/2013   Pseudophakia of both eyes 10/07/2013   Drusen of left macula 12/01/2012   Scleritis of both eyes 12/01/2012   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   PTSD (post-traumatic stress disorder) 03/10/2012   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 [place] [address] Phone: [phone] Fax: [phone]     Social Drivers of Health (SDOH) Social History: SDOH Screenings   Food Insecurity: No Food Insecurity (12/04/2023)  Housing: Low Risk  (12/04/2023)  Transportation Needs: No Transportation Needs (12/04/2023)  Utilities: Not At Risk (12/04/2023)  Alcohol  Screen: Low Risk  (01/06/2023)  Depression (PHQ2-9): Low Risk  (04/21/2023)  Financial Resource Strain: Low Risk   (01/06/2023)  Physical Activity: Inactive (01/06/2023)  Social Connections: Socially Isolated (12/04/2023)  Stress: No Stress Concern Present (01/06/2023)  Tobacco Use: Medium Risk (12/03/2023)   SDOH Interventions:     Readmission Risk Interventions    12/06/2023    3:24 PM  Readmission Risk Prevention Plan  Transportation Screening Complete  PCP or Specialist Appt within 5-7 Days Complete  Home Care Screening Complete  Medication Review (RN CM) Complete

## 2023-12-06 NOTE — Evaluation (Signed)
 Occupational Therapy Evaluation Patient Details Name: [name] MRN: [id number] DOB: Aug 18, 1940 Today's Date: 12/06/2023   History of Present Illness   Pt is a 83 year old male who was admitted from home s/p hemoptysis and now s/p chest tube placement 2* hemothroax 12/04/23.  Pt with hx of CHF, ischemic cardiomyopathy, aortic aneurysm -s/p repair, CAD, PTSD, RA, DM, tremor, back surgery, fem-pop bypass, pacemaker, CKD, afib     Clinical Impressions Patient is a 83 year old male who was admitted for above. Patient was living at home with independence in ADLs on 2-3L/min with activity. Patient was limited with chest tube lines connected for participation in session, patient bumped up to 5L./min to maintain during activity with patient demonstrating SOB during session even with O2 in 90s. Patient was noted to have decreased functional activity tolerance, decreased endurance, decreased standing balance, decreased safety awareness, and decreased knowledge of AD/AE impacting participation in ADLs. Plan is for patient to d/c home with [facility] services at time of d/c.      If plan is discharge home, recommend the following:   Assistance with cooking/housework;Direct supervision/assist for medications management;Assist for transportation;Help with stairs or ramp for entrance;Direct supervision/assist for financial management;A lot of help with walking and/or transfers;A lot of help with bathing/dressing/bathroom     Functional Status Assessment   Patient has had a recent decline in their functional status and demonstrates the ability to make significant improvements in function in a reasonable and predictable amount of time.     Equipment Recommendations   None recommended by OT      Precautions/Restrictions   Precautions Precautions: Fall Restrictions Weight Bearing Restrictions Per Provider Order: No     Mobility Bed Mobility Overal bed mobility: Needs Assistance Bed  Mobility: Supine to Sit     Supine to sit: Supervision     General bed mobility comments: for safety only      Balance Overall balance assessment: Needs assistance Sitting-balance support: No upper extremity supported, Feet supported Sitting balance-Leahy Scale: Good     Standing balance support: Bilateral upper extremity supported Standing balance-Leahy Scale: Poor         ADL either performed or assessed with clinical judgement   ADL Overall ADL's : Needs assistance/impaired Eating/Feeding: Supervision/ safety;Sitting   Grooming: Sitting;Set up   Upper Body Bathing: Sitting;Minimal assistance   Lower Body Bathing: Sitting/lateral leans;Maximal assistance   Upper Body Dressing : Sitting;Minimal assistance   Lower Body Dressing: Sitting/lateral leans;Maximal assistance   Toilet Transfer: Minimal assistance;Ambulation;Rolling walker (2 wheels) Toilet Transfer Details (indicate cue type and reason): taking small steps in room with increased time. patient limited in room with chest tube connected to wall. Toileting- Clothing Manipulation and Hygiene: Sit to/from stand;Maximal assistance               Vision Baseline Vision/History: 1 Wears glasses Vision Assessment?: Wears glasses for reading Additional Comments: noted to have discoloration around peripreal of sclera. nurse aware            Pertinent Vitals/Pain Pain Assessment Pain Assessment: No/denies pain     Extremity/Trunk Assessment Upper Extremity Assessment Upper Extremity Assessment: Right hand dominant RUE Deficits / Details: noted to FF to about 60 degrees with reports both UEs have 'AGE ON THEM' RUE: Shoulder pain with ROM RUE Coordination: decreased gross motor LUE Deficits / Details: reported he always falls on this one. LUE Coordination: decreased gross motor   Lower Extremity Assessment Lower Extremity Assessment: Generalized weakness  Communication  Communication Communication: No apparent difficulties   Cognition Arousal: Alert Behavior During Therapy: Flat affect Cognition: No apparent impairments     Following commands: Intact       Cueing  General Comments   Cueing Techniques: Verbal cues              Home Living Family/patient expects to be discharged to:: Private residence Living Arrangements: Alone Available Help at Discharge: Family;Available PRN/intermittently Type of Home: House Home Access: Stairs to enter [employer] of Steps: 1   Home Layout: One level     Bathroom Shower/Tub: Chief Strategy Officer: Handicapped height     Home Equipment: Cane - single point;Cane - quad;Grab bars - tub/shower;Electric scooter;Wheelchair - Surveyor, quantity (2 wheels)   Additional Comments: patient uses scooter to get to mail box, conversion [employer] to get in and out. daughter has helped with getting to appointments as well, daughter comes over every tuesday lives 15 mins away can help with groceries as needed.      Prior Functioning/Environment Prior Level of Function : Independent/Modified Independent               ADLs Comments: patient was on O2 at home 2L/min at rest has bumped to 3L/min with movement at home    OT Problem List: Decreased strength;Impaired balance (sitting and/or standing);Decreased knowledge of precautions;Decreased safety awareness;Decreased knowledge of use of DME or AE;Decreased activity tolerance;Cardiopulmonary status limiting activity   OT Treatment/Interventions: Self-care/ADL training;DME and/or AE instruction;Balance training;Therapeutic activities;Therapeutic exercise;Energy conservation;Patient/family education      OT Goals(Current goals can be found in the care plan section)   Acute Rehab OT Goals Patient Stated Goal: to get back home OT Goal Formulation: With patient Time For Goal Achievement: 12/20/23 Potential to Achieve Goals: Fair   OT  Frequency:  Min 2X/week    Co-evaluation   Reason for Co-Treatment: For patient/therapist safety;To address functional/ADL transfers PT goals addressed during session: Mobility/safety with mobility OT goals addressed during session: ADL's and self-care      AM-PAC OT "6 Clicks" Daily Activity     Outcome Measure Help from another person eating meals?: A Little Help from another person taking care of personal grooming?: A Little Help from another person toileting, which includes using toliet, bedpan, or urinal?: A Lot Help from another person bathing (including washing, rinsing, drying)?: A Lot Help from another person to put on and taking off regular upper body clothing?: A Little Help from another person to put on and taking off regular lower body clothing?: A Lot 6 Click Score: 15   End of Session Equipment Utilized During Treatment: Gait belt;Rolling walker (2 wheels);Oxygen  Nurse Communication: Other (comment) (nurse ok for patient to sit EOB at end of session nurse aware of poor pleth line on patients O2 during session)  Activity Tolerance: Patient tolerated treatment well Patient left: in bed;with call bell/phone within reach  OT Visit Diagnosis: Unsteadiness on feet (R26.81);Other abnormalities of gait and mobility (R26.89);History of falling (Z91.81)                Time: 4782-9562 OT Time Calculation (min): 21 min Charges:  OT General Charges $OT Visit: 1 Visit OT Evaluation $OT Eval Moderate Complexity: 1 Mod  Yanessa Hocevar OTR/L, MS Acute Rehabilitation Department Office# [phone]   Jame Maze 12/06/2023, 3:15 PM

## 2023-12-06 NOTE — Progress Notes (Signed)
 NAME:  [name], MRN:  [id number], DOB:  1940/11/14, LOS: 3 ADMISSION DATE:  12/03/2023, CONSULTATION DATE: 5/8 REFERRING MD: Dr. Tamela Fake, CHIEF COMPLAINT: Hemoptysis  History of Present Illness:  83 year old male with multiple medical problems as outlined below which are significant for severe COPD, chronic hypoxic respiratory failure on 2 L at home, obstructive sleep apnea on CPAP, rheumatoid arthritis, and atrial fibrillation.  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  He presented to the pulmonary clinic on 5/8 with complaints of hemoptysis.  Chest x-ray showed dense consolidation versus effusion versus atelectasis of the left upper lobe.  CT angiogram on 4/22 showed a loculated effusion in that location.  Patient complains of cough with large chunks of bright red blood but not lower than 1/4 cup. He was felt to be high risk of decompensation and was referred to the ED for further care. PCCM was consulted.   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), Anxiety, Aortic aneurysm (HCC) (09/02/2017), BENIGN PROSTATIC HYPERTROPHY (11/23/2009), Cardiomyopathy (HCC) (08/28/2016), Chronic systolic CHF (congestive heart failure) (HCC) (09/02/2017), COPD (chronic obstructive pulmonary disease) (HCC), CORONARY ARTERY DISEASE (11/23/2009), DECREASED HEARING, LEFT EAR (03/01/2010), DEGENERATIVE JOINT DISEASE (11/23/2009), DEPRESSION (11/23/2009), FATIGUE (11/23/2009), GAIT DISTURBANCE (12/10/2009), HEMOPTYSIS UNSPECIFIED (05/07/2010), High cholesterol, HYPERTENSION (07/30/2009), HYPOTHYROIDISM (07/30/2009), Ischemic cardiomyopathy (09/02/2017), LUMBAR RADICULOPATHY, 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   Significant Hospital Events: Including procedures, antibiotic start and stop dates in addition to other pertinent events   5/8 admit 5/9 CT guided chest tube placed by IR, aspirated blood concerning for hemothorax 5/10 of chest tube output  Interim History / Subjective:   Chest tube drained in past 24 hours Chest radiograph has improved  Objective    Blood pressure (!) 172/68, pulse 63, temperature (!) 97.4 F (36.3 C), temperature source Axillary, resp. rate (!) 23, height 6\' 2"  (1.88 m), weight 82.6 kg, SpO2 94%.        Intake/Output Summary (Last 24 hours) at 12/06/2023 0823 Last data filed at 12/06/2023 0600 Gross per 24 hour  Intake --  Output 1575 ml  Net -1575 ml   Filed Weights   12/03/23 1515 12/03/23 2321  Weight: 86.2 kg 82.6 kg    Examination: General: Elderly gentleman in NAD HENT: /AT, no JVD Lungs: Clear bilateral breath sounds. No wheeze Cardiovascular: RRR, no MRG Abdomen: Soft, NT, ND Extremities: No acute deformity or ROM limitation Neuro: Alert, oriented, non-focal  Resolved Hospital Problem list:    Assessment & Plan:   Hemoptysis - no further episodes since admission - Supplemental O2 as needed to keep O2 sats 88-95% - TXA nebs x 3, then stop  Hemothorax - CT showing what looks like a loculated pleural effusion adjacent to the left upper lobe. Differential includes hemothorax following falls vs mass/hemorrhage - Based on improvement after chest tube placement, most likely hemothorax with improving chest radiographs - will hold off on pleural lytics - chest tube to remain in place until draining or less - Hold plavix , last dose 5/8 AM  COPD without acute exacerbation OSA on CPAP  Chronic hypoxemic respiratory failure on 2-3L home  oxygen  therapy - Sat goal 88-95%, supplemental O2 titrated to goal. Reduced to 3L today.  - at bedtime CPAP - Anoro in place of home stiolto  Remainder per primary   Best Practice (right click and "Reselect all SmartList Selections" daily)   Per primary  Critical care time:     Duaine German, MD [facility] Office: [phone]   See Amion for personal pager PCCM on call pager [phone] until 7pm. Please call Elink 7p-7a. [phone]

## 2023-12-06 NOTE — Evaluation (Signed)
 Physical Therapy Evaluation Patient Details Name: [name] MRN: [id number] DOB: 10/22/1940 Today's Date: 12/06/2023  History of Present Illness  Pt admitted from home s/p hemoptysis and now s/p chest tube placement 2* hemothroax 12/04/23.  Pt with hx of CHF, ischemic cardiomyopathy, aortic aneurysm -s/p repair, CAD, PTSD, RA, DM, tremor, back surgery, fem-pop bypass, pacemaker, CKD, afib  Clinical Impression  Pt admitted as above and presenting with functional mobility limitations 2* generalized weakness, balance deficits and decreased activity tolerance.  Pt very cooperative and hoping to progress to dc home with assist of family.        If plan is discharge home, recommend the following: A little help with walking and/or transfers;A little help with bathing/dressing/bathroom;Assistance with cooking/housework;Help with stairs or ramp for entrance;Assist for transportation   Can travel by private vehicle        Equipment Recommendations None recommended by PT  Recommendations for Other Services       Functional Status Assessment Patient has had a recent decline in their functional status and demonstrates the ability to make significant improvements in function in a reasonable and predictable amount of time.     Precautions / Restrictions Precautions Precautions: Fall Restrictions Weight Bearing Restrictions Per Provider Order: No      Mobility  Bed Mobility Overal bed mobility: Needs Assistance Bed Mobility: Supine to Sit     Supine to sit: Supervision     General bed mobility comments: for safety only    Transfers Overall transfer level: Needs assistance Equipment used: Rolling walker (2 wheels) Transfers: Sit to/from Stand Sit to Stand: Min assist           General transfer comment: min cues for use of UEs to self assist    Ambulation/Gait Ambulation/Gait assistance: Min assist, Contact guard assist Gait Distance (Feet): 40 Feet Assistive device:  Rolling walker (2 wheels) Gait Pattern/deviations: Step-to pattern, Decreased step length - right, Decreased step length - left, Shuffle, Trunk flexed       General Gait Details: Remained in room at pt request, Steady assist with cues for posture and position from RW  Stairs            Wheelchair Mobility     Tilt Bed    Modified Rankin (Stroke Patients Only)       Balance Overall balance assessment: Needs assistance Sitting-balance support: No upper extremity supported, Feet supported Sitting balance-Leahy Scale: Good     Standing balance support: Bilateral upper extremity supported Standing balance-Leahy Scale: Poor                               Pertinent Vitals/Pain Pain Assessment Pain Assessment: No/denies pain    Home Living Family/patient expects to be discharged to:: Private residence Living Arrangements: Alone Available Help at Discharge: Family;Available PRN/intermittently Type of Home: House Home Access: Stairs to enter   Entrance Stairs-Number of Steps: 1   Home Layout: One level Home Equipment: Cane - single point;Cane - quad;Grab bars - tub/shower;Electric scooter;Wheelchair - Surveyor, quantity (2 wheels) Additional Comments: patient uses scooter to get to mail box, conversion [employer] to get in and out. daughter has helped with getting to appointments as well, daughter comes over every tuesday lives 15 mins away can help with groceries as needed.    Prior Function Prior Level of Function : Independent/Modified Independent               ADLs Comments:  patient was on O2 at home 2L/min at rest has bumped to 3L/min with movement at home     Extremity/Trunk Assessment   Upper Extremity Assessment Upper Extremity Assessment: Defer to OT evaluation RUE Deficits / Details: noted to FF to about 60 degrees    Lower Extremity Assessment Lower Extremity Assessment: Generalized weakness       Communication    Communication Communication: No apparent difficulties    Cognition Arousal: Alert Behavior During Therapy: Flat affect                             Following commands: Intact       Cueing Cueing Techniques: Verbal cues     General Comments      Exercises     Assessment/Plan    PT Assessment Patient needs continued PT services  PT Problem List Decreased strength;Decreased activity tolerance;Decreased balance;Decreased mobility;Decreased knowledge of use of DME       PT Treatment Interventions DME instruction;Gait training;Stair training;Functional mobility training;Therapeutic activities;Therapeutic exercise;Patient/family education;Balance training    PT Goals (Current goals can be found in the Care Plan section)  Acute Rehab PT Goals Patient Stated Goal: Regain IND to return home PT Goal Formulation: With patient Time For Goal Achievement: 12/20/23 Potential to Achieve Goals: Good    Frequency Min 3X/week     Co-evaluation PT/OT/SLP Co-Evaluation/Treatment: Yes Reason for Co-Treatment: For patient/therapist safety;To address functional/ADL transfers PT goals addressed during session: Mobility/safety with mobility OT goals addressed during session: ADL's and self-care       AM-PAC PT "6 Clicks" Mobility  Outcome Measure Help needed turning from your back to your side while in a flat bed without using bedrails?: None Help needed moving from lying on your back to sitting on the side of a flat bed without using bedrails?: A Little Help needed moving to and from a bed to a chair (including a wheelchair)?: A Little Help needed standing up from a chair using your arms (e.g., wheelchair or bedside chair)?: A Little Help needed to walk in hospital room?: A Little Help needed climbing 3-5 steps with a railing? : A Lot 6 Click Score: 18    End of Session Equipment Utilized During Treatment: Gait belt;Oxygen  Activity Tolerance: Patient tolerated treatment  well Patient left: Other (comment) (sitting EOB) Nurse Communication: Mobility status PT Visit Diagnosis: Difficulty in walking, not elsewhere classified (R26.2);Muscle weakness (generalized) (M62.81)    Time: 1610-9604 PT Time Calculation (min) (ACUTE ONLY): 23 min   Charges:   PT Evaluation $PT Eval Low Complexity: 1 Low   PT General Charges $$ ACUTE PT VISIT: 1 Visit         Thedora Finlay PT Acute Rehabilitation Services Pager [phone] Office [phone]   Joory Gough 12/06/2023, 1:06 PM

## 2023-12-06 NOTE — Progress Notes (Signed)
 PROGRESS NOTE    [name]  NFA:[id number] DOB: 1940/08/09 DOA: 12/03/2023 PCP: Adelia Homestead, MD   Brief Narrative:  This 83 yrs old male with PMH significant for COPD on 3L of O2, DM2, OSA on CPAP, rheumatoid arthritis, lung nodules , CHF, CAD, CKD, A-fib ,ITP on chronic steroids, left common femoral to popliteal bypass in January 2022, pacemaker, status post autologous stem cell transplant for COPD in 2017, ascending aortic aneurysm presented in the ED with hemoptysis.  Patient reports 2 days ago, He has blood with phlegm now coughing up blood clots. He was seen by pulmonologist who sent him to the ED.  Patient is not on anticoagulation due to ITP.  He is on Plavix  due to CAD.  Patient was hypoxic with SpO2 of 89% on 3 L, improved on 4 L to 94%.  Patient was admitted for further evaluation.  Assessment & Plan:   Principal Problem:   Hemoptysis Active Problems:   Diabetes mellitus with complication, with long-term current use of insulin  (HCC)   COPD (chronic obstructive pulmonary disease) (HCC)   Hypothyroidism   Acute on chronic respiratory failure (HCC)   OSA on CPAP   PAF (paroxysmal atrial fibrillation) (HCC)   Lung mass   Idiopathic thrombocytopenic purpura (ITP) (HCC)   AKI (acute kidney injury) (HCC)   Lung nodules   Pacemaker   Rheumatoid arthritis (HCC)   S/P femoral-tibial bypass   QT prolongation   Pleural effusion   Malnutrition of moderate degree    Hemoptysis: Hemothorax : Patient presented with hemoptysis for 2 days. His SpO2 is 88 to 95% on 4 L.  Continue supplemental oxygen . Continue tranexamic acid  nebs.  No further hemoptysis since admission. CT chest shows left upper lobe opacity appears loculated effusion. He fell about a week ago and landed on the left side so this may be hemothorax. IR consulted.  Patient underwent CT-guided left chest tube placement. Chest tube connected with the drainage system.  Fluid sent for cytology and  culture Management per Critical care.  Chest tube to remain until draining 100 mL or less  COPD: Continue home meds. Appreciated Pulmonology consult.   Diabetes mellitus with hyperglycemia: Continue Semglee  15 units daily. Continue sensitive regular sliding scale. Hold p.o. diabetic medications.   Hemoglobin A1c 8.5 better than before   Idiopathic thrombocytopenic purpura (ITP) (HCC) Continue chronic steroids prednisone  10 mg poq day   OSA on CPAP: Continue CPAP at night.   Pacemaker: Chronic,  stable   PAF (paroxysmal atrial fibrillation) (HCC) Resume Coreg  when blood pressure improves. Hold Eliquis  in the setting of hemoptysis.   Rheumatoid arthritis (HCC) Hold arava  for now.   S/P femoral-tibial bypass: Hold Plavix .   Lung nodules: Appreciate pulmonology consult. Needs outpatient follow-up.   Acute on chronic respiratory failure [facility]): Patient uses supplemental oxygen  2 L at baseline,  now requiring 4 L likely due to hemoptysis.   Continue supplemental oxygen  and wean as tolerated.[name]  AKI (acute kidney injury) (HCC) > Improving Due to decreased p.o. intake.   Baseline creatinine normal,  presented with creatinine 2.15 Continue IV hydration.  Serum creatinine improving.  1.66 today   QT prolongation Avoid QT prolonging medications, rehydrate and correct electrolytes. Repeat EKG.   Hypothyroidism: Continue Synthroid  125 mcg daily.   Lung mass / Loculated Effusion: CT scan showing potential mass although loculated hemorrhage is also possibility.  Appreciate pulmonology consult.  Pulmonology recommended CT guided needle aspiration and send for biopsy.  IR consulted. Patient underwent  CT-guided left chest tube placement. Chest tube connected with the drainage system.  Fluid sent for cytology and culture Management per Critical care.  DVT prophylaxis: SCDs Code Status: DNR Family Communication: No family at bedside Disposition Plan:    Status is:  Inpatient Remains inpatient appropriate because: Admitted for hemoptysis,  pulmonology is consulted. Patient underwent CT-guided left chest tube placement. Chest tube connected with the drainage system.  Fluid sent for cytology and culture Management per Critical care.   Consultants:  Pulmonology  Procedures:CT chest  Antimicrobials:  Anti-infectives (From admission, onward)    None      Subjective: Seen and examined at bedside.  Overnight events noted. Patient reports feeling better, Patient underwent CT-guided left chest tube placement 5/9. Chest tube connected with the drainage system.    Objective: Vitals:   12/06/23 0720 12/06/23 0739 12/06/23 0800 12/06/23 1000  BP:   (!) 179/68 (!) 160/73  Pulse: 67 63 65 61  Resp: 18 (!) 23 20 19   Temp:  (!) 97.4 F (36.3 C)    TempSrc:  Axillary    SpO2: 96% 94% 95% 91%  Weight:      Height:        Intake/Output Summary (Last 24 hours) at 12/06/2023 1036 Last data filed at 12/06/2023 0900 Gross per 24 hour  Intake --  Output 1150 ml  Net -1150 ml   Filed Weights   12/03/23 1515 12/03/23 2321  Weight: 86.2 kg 82.6 kg    Examination:  General exam: Appears calm and comfortable, deconditioned, not in any acute distress. Respiratory system: Decreased breath sounds. Respiratory effort normal.  RR 16. Chest wall: Chest tube connected on left side with bloody fluid Cardiovascular system: S1 & S2 heard, RRR. No JVD, murmurs, rubs, gallops or clicks.  Gastrointestinal system: Abdomen is non distended, soft and non tender. Normal bowel sounds heard. Central nervous system: Alert and oriented x 3. No focal neurological deficits. Extremities: No edema, no cyanosis, no clubbing. Skin: No rashes, lesions or ulcers Psychiatry: Judgement and insight appear normal. Mood & affect appropriate.   Data Reviewed: I have personally reviewed following labs and imaging studies  CBC: Recent Labs  Lab 12/01/23 1337 12/03/23 1607  12/04/23 0032 12/05/23 0303 12/06/23 0254  WBC 10.9* 11.3* 9.9 10.9* 9.8  NEUTROABS 8.9*  --   --   --   --   HGB 12.4* 11.4* 12.0* 11.1* 12.6*  HCT 37.8* 36.7* 39.3 36.5* 40.5  MCV 86.1 90.8 92.5 93.4 92.3  PLT 150 150 147* 152 171   Basic Metabolic Panel: Recent Labs  Lab 12/03/23 1755 12/04/23 0032 12/04/23 0835 12/05/23 0303 12/06/23 0254  NA 134* 131*  --  140  --   K 4.5 5.2* 4.8 4.1  --   CL 98 96*  --  106  --   CO2 22 19*  --  24  --   GLUCOSE 152* 252*  --  150*  --   BUN 52* 57*  --  62*  --   CREATININE 2.12* 2.15*  --  1.96* 1.66*  CALCIUM  8.5* 8.6*  --  8.9  --   MG 1.7 1.8  --  2.1  --   PHOS 4.3 5.9*  --  3.6  --    GFR: Estimated Creatinine Clearance: 39.9 mL/min (A) (by C-G formula based on SCr of 1.66 mg/dL (H)). Liver Function Tests: Recent Labs  Lab 12/03/23 1755 12/04/23 0032  AST 24 24  ALT 16 17  ALKPHOS  46 53  BILITOT 1.1 1.6*  PROT 6.6 7.0  ALBUMIN  3.5 3.4*   No results for input(s): "LIPASE", "AMYLASE" in the last 168 hours. No results for input(s): "AMMONIA" in the last 168 hours. Coagulation Profile: Recent Labs  Lab 12/04/23 0032  INR 1.1   Cardiac Enzymes: Recent Labs  Lab 12/04/23 0032  CKTOTAL 56   BNP (last 3 results) No results for input(s): "PROBNP" in the last 8760 hours. HbA1C: Recent Labs    12/04/23 0032  HGBA1C 8.6*   CBG: Recent Labs  Lab 12/05/23 1600 12/05/23 1946 12/06/23 0024 12/06/23 0352 12/06/23 0722  GLUCAP 258* 247* 164* 96 129*   Lipid Profile: No results for input(s): "CHOL", "HDL", "LDLCALC", "TRIG", "CHOLHDL", "LDLDIRECT" in the last 72 hours. Thyroid  Function Tests: Recent Labs    12/04/23 0032  TSH 4.127   Anemia Panel: No results for input(s): "VITAMINB12", "FOLATE", "FERRITIN", "TIBC", "IRON ", "RETICCTPCT" in the last 72 hours. Sepsis Labs: Recent Labs  Lab 12/03/23 1755  PROCALCITON 0.13    Recent Results (from the past 240 hours)  Resp panel by RT-PCR (RSV, Flu  A&B, Covid) Anterior Nasal Swab     Status: None   Collection Time: 12/03/23  4:07 PM   Specimen: Anterior Nasal Swab  Result Value Ref Range Status   SARS Coronavirus 2 by RT PCR NEGATIVE NEGATIVE Final    Comment: (NOTE) SARS-CoV-2 target nucleic acids are NOT DETECTED.  The SARS-CoV-2 RNA is generally detectable in upper respiratory specimens during the acute phase of infection. The lowest concentration of SARS-CoV-2 viral copies this assay can detect is 138 copies/mL. A negative result does not preclude SARS-Cov-2 infection and should not be used as the sole basis for treatment or other patient management decisions. A negative result may occur with  improper specimen collection/handling, submission of specimen other than nasopharyngeal swab, presence of viral mutation(s) within the areas targeted by this assay, and inadequate number of viral copies(<138 copies/mL). A negative result must be combined with clinical observations, patient history, and epidemiological information. The expected result is Negative.  Fact Sheet for Patients:  BloggerCourse.com  Fact Sheet for Healthcare Providers:  SeriousBroker.it  This test is no t yet approved or cleared by the [place]  FDA and  has been authorized for detection and/or diagnosis of SARS-CoV-2 by FDA under an Emergency Use Authorization (EUA). This EUA will remain  in effect (meaning this test can be used) for the duration of the COVID-19 declaration under Section 564(b)(1) of the Act, 21 U.S.C.section 360bbb-3(b)(1), unless the authorization is terminated  or revoked sooner.       Influenza A by PCR NEGATIVE NEGATIVE Final   Influenza B by PCR NEGATIVE NEGATIVE Final    Comment: (NOTE) The Xpert Xpress SARS-CoV-2/FLU/RSV plus assay is intended as an aid in the diagnosis of influenza from Nasopharyngeal swab specimens and should not be used as a sole basis for treatment.  Nasal washings and aspirates are unacceptable for Xpert Xpress SARS-CoV-2/FLU/RSV testing.  Fact Sheet for Patients: BloggerCourse.com  Fact Sheet for Healthcare Providers: SeriousBroker.it  This test is not yet approved or cleared by the [place]  FDA and has been authorized for detection and/or diagnosis of SARS-CoV-2 by FDA under an Emergency Use Authorization (EUA). This EUA will remain in effect (meaning this test can be used) for the duration of the COVID-19 declaration under Section 564(b)(1) of the Act, 21 U.S.C. section 360bbb-3(b)(1), unless the authorization is terminated or revoked.     Resp Syncytial Virus  by PCR NEGATIVE NEGATIVE Final    Comment: (NOTE) Fact Sheet for Patients: BloggerCourse.com  Fact Sheet for Healthcare Providers: SeriousBroker.it  This test is not yet approved or cleared by the [place]  FDA and has been authorized for detection and/or diagnosis of SARS-CoV-2 by FDA under an Emergency Use Authorization (EUA). This EUA will remain in effect (meaning this test can be used) for the duration of the COVID-19 declaration under Section 564(b)(1) of the Act, 21 U.S.C. section 360bbb-3(b)(1), unless the authorization is terminated or revoked.  Performed at [facility], [address], [place], [place] [address]   MRSA Next Gen by PCR, Nasal     Status: None   Collection Time: 12/03/23 11:21 PM   Specimen: Nasal Mucosa; Nasal Swab  Result Value Ref Range Status   MRSA by PCR Next Gen NOT DETECTED NOT DETECTED Final    Comment: (NOTE) The GeneXpert MRSA Assay (FDA approved for NASAL specimens only), is one component of a comprehensive MRSA colonization surveillance program. It is not intended to diagnose MRSA infection nor to guide or monitor treatment for MRSA infections. Test performance is not FDA approved in patients  less than 66 years old. Performed at [facility], [address], [place], [place] [address]   Aerobic/Anaerobic Culture w Gram Stain (surgical/deep wound)     Status: None (Preliminary result)   Collection Time: 12/04/23  4:45 PM   Specimen: Pleural Fluid  Result Value Ref Range Status   Specimen Description   Final    PLEURAL LEFT Performed at [facility], [address] [place], [place] [address]    Special Requests   Final    NONE Performed at [facility], [address], [place], [place] [address]    Gram Stain   Final    ABUNDANT WBC PRESENT, PREDOMINANTLY PMN NO ORGANISMS SEEN Performed at [facility], [address], [place], [place] [address]    Culture   Final    RARE Consistent with normal respiratory flora. Normal respiratory flora-no Staph aureus or Pseudomonas seen NO ANAEROBES ISOLATED; CULTURE IN PROGRESS FOR 5 DAYS    Report Status PENDING  Incomplete    Radiology Studies: DG CHEST PORT 1 VIEW Result Date: 12/05/2023 CLINICAL DATA:  Pleural effusion. EXAM: PORTABLE CHEST 1 VIEW COMPARISON:  Radiographic chest CT 12/03/2023 FINDINGS: Diminished loculated left pleural effusion at the apex. Visible pneumothorax. Stable chronic lung disease elsewhere with calcified pleural plaques, facet Dema and irregular pulmonary densities. Stable heart size and mediastinal contours. IMPRESSION: 1. Diminished loculated left pleural effusion at the apex. No pneumothorax. 2. Stable chronic lung disease. Electronically Signed   By: Chadwick Colonel M.D.   On: 12/05/2023 12:20   CT GUIDED PERITONEAL/RETROPERITONEAL FLUID DRAIN BY PERC CATH Result Date: 12/04/2023 INDICATION: 83 year old with an enlarging collection in left upper chest concerning for a complex hemothorax. EXAM: CT-GUIDED PLACEMENT OF LEFT CHEST TUBE TECHNIQUE: Multidetector CT imaging of the chest was performed following the standard protocol without IV contrast.  RADIATION DOSE REDUCTION: This exam was performed according to the departmental dose-optimization program which includes automated exposure control, adjustment of the mA and/or kV according to patient size and/or use of iterative reconstruction technique. MEDICATIONS: Fentanyl  50 mcg ANESTHESIA/SEDATION: The patient's level of consciousness and vital signs were monitored continuously by radiology nursing throughout the procedure under my direct supervision. COMPLICATIONS: None immediate. PROCEDURE: Informed written consent was obtained from the patient after a thorough discussion of the  procedural risks, benefits and alternatives. All questions were addressed. Maximal Sterile Barrier Technique was utilized including caps, mask, sterile gowns, sterile gloves, sterile drape, hand hygiene and skin antiseptic. A timeout was performed prior to the initiation of the procedure. Patient was placed on his right side. Ultrasound demonstrated that the collection in the left upper chest was a complex fluid collection. CT images through the chest were obtained. The complex pleural-based collection in the left upper chest was identified. The lateral left upper chest was prepped with chlorhexidine  and sterile field was created. Skin was anesthetized using 1% lidocaine . Using CT guidance, a Yueh catheter was directed into the collection and dark blood was aspirated. Superstiff Amplatz wire was advanced through the Yueh catheter into the collection. The tract was dilated to accommodate a 12 [place] multipurpose drain. The drain was attached to a chest tube drainage system. Fluid was sent for culture and cytology. Chest tube was sutured to skin. Dressing was placed. FINDINGS: Complex hemothorax in the posterolateral left upper chest. Drain was placed from a lateral approach. Large amount of dark bloody fluid was aspirated. IMPRESSION: CT-guided placement of a chest tube in the left upper chest collection. Findings are suggestive for a  hemothorax. Electronically Signed   By: Elene Griffes M.D.   On: 12/04/2023 17:47   Scheduled Meds:  atorvastatin   40 mg Oral Daily   Chlorhexidine  Gluconate Cloth  6 each Topical Q2200   guaiFENesin   600 mg Oral BID   insulin  aspart  0-9 Units Subcutaneous Q4H   insulin  glargine-yfgn  15 Units Subcutaneous QHS   levothyroxine   125 mcg Oral Q0600   multivitamin with minerals  1 tablet Oral Daily   predniSONE   10 mg Oral Q breakfast   pregabalin   75 mg Oral BID   Ensure Max Protein  11 oz Oral BID   tamsulosin   0.4 mg Oral QHS   tranexamic acid   500 mg Nebulization Q8H   traZODone   50 mg Oral QHS   umeclidinium-vilanterol  1 puff Inhalation Daily   Continuous Infusions:     LOS: 3 days    Time spent: 35 mins    Magdalene School, MD [facility]   If 7PM-7AM, please contact night-coverage

## 2023-12-07 ENCOUNTER — Inpatient Hospital Stay (HOSPITAL_COMMUNITY)

## 2023-12-07 DIAGNOSIS — Z4682 Encounter for fitting and adjustment of non-vascular catheter: Secondary | ICD-10-CM

## 2023-12-07 DIAGNOSIS — R042 Hemoptysis: Secondary | ICD-10-CM | POA: Diagnosis not present

## 2023-12-07 DIAGNOSIS — J9601 Acute respiratory failure with hypoxia: Secondary | ICD-10-CM

## 2023-12-07 DIAGNOSIS — J942 Hemothorax: Secondary | ICD-10-CM

## 2023-12-07 DIAGNOSIS — J439 Emphysema, unspecified: Secondary | ICD-10-CM

## 2023-12-07 LAB — GLUCOSE, CAPILLARY
Glucose-Capillary: 123 mg/dL — ABNORMAL HIGH (ref 70–99)
Glucose-Capillary: 127 mg/dL — ABNORMAL HIGH (ref 70–99)
Glucose-Capillary: 162 mg/dL — ABNORMAL HIGH (ref 70–99)
Glucose-Capillary: 188 mg/dL — ABNORMAL HIGH (ref 70–99)
Glucose-Capillary: 238 mg/dL — ABNORMAL HIGH (ref 70–99)
Glucose-Capillary: 250 mg/dL — ABNORMAL HIGH (ref 70–99)
Glucose-Capillary: 84 mg/dL (ref 70–99)

## 2023-12-07 LAB — CREATININE, SERUM
Creatinine, Ser: 1.48 mg/dL — ABNORMAL HIGH (ref 0.61–1.24)
GFR, Estimated: 47 mL/min — ABNORMAL LOW (ref >60)

## 2023-12-07 MED ORDER — POTASSIUM CHLORIDE CRYS ER 20 MEQ PO TBCR
40.0000 meq | EXTENDED_RELEASE_TABLET | Freq: Once | ORAL | Status: AC
  Administered 2023-12-07: 40 meq via ORAL
  Filled 2023-12-07: qty 2

## 2023-12-07 MED ORDER — FUROSEMIDE 10 MG/ML IJ SOLN
40.0000 mg | Freq: Once | INTRAMUSCULAR | Status: AC
  Administered 2023-12-07: 40 mg via INTRAVENOUS
  Filled 2023-12-07: qty 4

## 2023-12-07 MED ORDER — CARVEDILOL 3.125 MG PO TABS
3.1250 mg | ORAL_TABLET | Freq: Two times a day (BID) | ORAL | Status: DC
  Administered 2023-12-07 – 2023-12-09 (×4): 3.125 mg via ORAL
  Filled 2023-12-07 (×4): qty 1

## 2023-12-07 NOTE — Inpatient Diabetes Management (Signed)
 Inpatient Diabetes Program Recommendations  AACE/ADA: New Consensus Statement on Inpatient Glycemic Control (2015)  Target Ranges:  Prepandial:   less than 140 mg/dL      Peak postprandial:   less than 180 mg/dL (1-2 hours)      Critically ill patients:  140 - 180 mg/dL   Lab Results  Component Value Date   GLUCAP 84 12/07/2023   HGBA1C 8.6 (H) 12/04/2023    Review of Glycemic Control  Latest Reference Range & Units 12/06/23 16:08 12/06/23 19:52 12/07/23 00:18 12/07/23 04:33 12/07/23 07:44  Glucose-Capillary 70 - 99 mg/dL 161 (H) 096 (H) 045 (H) 127 (H) 84  (H): Data is abnormally high Diabetes history: Type 2 DM Outpatient Diabetes medications: Lantus  20 units every day, Novolog  0-12 units TID, Ozempic , Synjardy 25/1000 mg QD Current orders for Inpatient glycemic control: Semglee  15 units at bedtime, Novolog  0-9 units Q4H Prednisone  10 mg QD  Inpatient Diabetes Program Recommendations:    Consider changing correction to TID & HS now that patient eating.   Thanks, Marjo Sievert, MSN, RNC-OB Diabetes Coordinator [phone] (8a-5p)

## 2023-12-07 NOTE — Progress Notes (Signed)
 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  Patient reports 2 days ago, He has blood with phlegm now coughing up blood clots. He was seen by pulmonologist who sent him to the ED.  Patient is not on anticoagulation due to ITP.  He is on Plavix  due to CAD.  Patient was hypoxic with SpO2 of 89% on 3 L, improved on 4 L to 94%.  Patient was admitted for further evaluation.  Assessment & Plan:   Principal Problem:   Hemoptysis Active Problems:   Diabetes mellitus with complication, with long-term current use of insulin  (HCC)   COPD (chronic obstructive pulmonary disease) (HCC)   Hypothyroidism   Acute on chronic respiratory failure (HCC)   OSA on CPAP   PAF (paroxysmal atrial fibrillation) (HCC)   Lung mass   Idiopathic thrombocytopenic purpura (ITP) (HCC)   AKI (acute kidney injury) (HCC)   Lung nodules   Pacemaker   Rheumatoid arthritis (HCC)   S/P femoral-tibial bypass   QT prolongation   Pleural effusion   Malnutrition of moderate degree    Hemoptysis: Hemothorax : Patient presented with hemoptysis for 2 days. His SpO2 is 88 to 95% on 4 L.  Continue supplemental oxygen . Continue tranexamic acid  nebs.  No further hemoptysis since admission. CT chest shows left upper lobe opacity appears loculated effusion. He fell about a week ago and landed on the left side so this may be hemothorax. IR consulted.  Patient underwent CT-guided left chest tube placement. Chest tube connected with the drainage system.  Fluid sent for cytology and  culture Management per Critical care.  Chest tube to remain until draining 100 mL or less. Fluid shows normal respiratory flora.  COPD: Continue home meds. Appreciated Pulmonology consult.   Diabetes mellitus with hyperglycemia: Continue Semglee  15 units daily. Continue sensitive regular sliding scale. Hold p.o. diabetic medications.   Hemoglobin A1c 8.5 better than before.   Idiopathic thrombocytopenic purpura (ITP) (HCC) Continue chronic steroids prednisone  10 mg poq day.   OSA on CPAP: Continue CPAP at night.   Pacemaker: Chronic,  stable   PAF (paroxysmal atrial fibrillation) (HCC) Continue Coreg  3.125 mg every 12 hours. Hold Eliquis  in the setting of hemoptysis.   Rheumatoid arthritis (HCC) Hold arava  for now.   S/P femoral-tibial bypass: Hold Plavix .   Lung nodules: Appreciate pulmonology consult. Needs outpatient follow-up.   Acute on chronic respiratory failure [facility]): Patient uses supplemental oxygen  2 L at baseline,  now requiring 4 L likely due to hemoptysis.   Continue supplemental oxygen  and wean as tolerated.[name]  AKI (acute kidney injury) (HCC) > Improving Due to decreased p.o. intake.   Baseline creatinine normal,  presented with creatinine 2.15 Continue IV hydration.  Serum creatinine improving.  1.48 today   QT prolongation Avoid QT prolonging medications, rehydrate and correct electrolytes. Repeat EKG.   Hypothyroidism: Continue Synthroid  125 mcg daily.   Lung mass / Loculated Effusion: CT scan showing potential mass although loculated hemorrhage is also possibility.  Appreciate pulmonology consult.  Pulmonology recommended CT guided needle aspiration and send for  biopsy.  IR consulted. Patient underwent CT-guided left chest tube placement. Chest tube connected with the drainage system.  Fluid sent for cytology and culture Management per Critical care.  DVT prophylaxis: SCDs Code Status: DNR Family Communication: No family at  bedside Disposition Plan:    Status is: Inpatient Remains inpatient appropriate because: Admitted for hemoptysis,  pulmonology is consulted. Patient underwent CT-guided left chest tube placement. Chest tube connected with the drainage system.  Fluid sent for cytology and culture Management per Critical care.   Consultants:  Pulmonology  Procedures:CT chest  Antimicrobials:  Anti-infectives (From admission, onward)    None      Subjective: Seen and examined at bedside. Overnight events noted. Patient reports feeling much better.  He was having breakfast. Chest tube is connected with the drainage system,  has minimal output.   Objective: Vitals:   12/07/23 0800 12/07/23 0900 12/07/23 0956 12/07/23 1000  BP: (!) 162/74 (!) 183/112 136/62 131/64  Pulse: 61 61 60 61  Resp: (!) 25 (!) 21 19 13   Temp: 97.7 F (36.5 C)     TempSrc: Oral     SpO2: 99% 96% 100% 100%  Weight:      Height:        Intake/Output Summary (Last 24 hours) at 12/07/2023 1010 Last data filed at 12/07/2023 0719 Gross per 24 hour  Intake --  Output 1875 ml  Net -1875 ml   Filed Weights   12/03/23 1515 12/03/23 2321  Weight: 86.2 kg 82.6 kg    Examination:  General exam: Appears calm and comfortable, deconditioned, not in any acute distress. Respiratory system: Decreased breath sounds. Respiratory effort normal.  RR 16. Chest wall: Chest tube connected on left side with bloody fluid Cardiovascular system: S1 & S2 heard, RRR. No JVD, murmurs, rubs, gallops or clicks.  Gastrointestinal system: Abdomen is non distended, soft and non tender. Normal bowel sounds heard. Central nervous system: Alert and oriented x 3. No focal neurological deficits. Extremities: No edema, no cyanosis, no clubbing. Skin: No rashes, lesions or ulcers Psychiatry: Judgement and insight appear normal. Mood & affect appropriate.   Data Reviewed: I have personally reviewed following labs and imaging studies  CBC: Recent  Labs  Lab 12/01/23 1337 12/03/23 1607 12/04/23 0032 12/05/23 0303 12/06/23 0254  WBC 10.9* 11.3* 9.9 10.9* 9.8  NEUTROABS 8.9*  --   --   --   --   HGB 12.4* 11.4* 12.0* 11.1* 12.6*  HCT 37.8* 36.7* 39.3 36.5* 40.5  MCV 86.1 90.8 92.5 93.4 92.3  PLT 150 150 147* 152 171   Basic Metabolic Panel: Recent Labs  Lab 12/03/23 1755 12/04/23 0032 12/04/23 0835 12/05/23 0303 12/06/23 0254 12/07/23 0322  NA 134* 131*  --  140  --   --   K 4.5 5.2* 4.8 4.1  --   --   CL 98 96*  --  106  --   --   CO2 22 19*  --  24  --   --   GLUCOSE 152* 252*  --  150*  --   --   BUN 52* 57*  --  62*  --   --   CREATININE 2.12* 2.15*  --  1.96* 1.66* 1.48*  CALCIUM  8.5* 8.6*  --  8.9  --   --   MG 1.7 1.8  --  2.1  --   --   PHOS 4.3 5.9*  --  3.6  --   --    GFR:  Estimated Creatinine Clearance: 44.7 mL/min (A) (by C-G formula based on SCr of 1.48 mg/dL (H)). Liver Function Tests: Recent Labs  Lab 12/03/23 1755 12/04/23 0032  AST 24 24  ALT 16 17  ALKPHOS 46 53  BILITOT 1.1 1.6*  PROT 6.6 7.0  ALBUMIN  3.5 3.4*   No results for input(s): "LIPASE", "AMYLASE" in the last 168 hours. No results for input(s): "AMMONIA" in the last 168 hours. Coagulation Profile: Recent Labs  Lab 12/04/23 0032  INR 1.1   Cardiac Enzymes: Recent Labs  Lab 12/04/23 0032  CKTOTAL 56   BNP (last 3 results) No results for input(s): "PROBNP" in the last 8760 hours. HbA1C: No results for input(s): "HGBA1C" in the last 72 hours.  CBG: Recent Labs  Lab 12/06/23 1608 12/06/23 1952 12/07/23 0018 12/07/23 0433 12/07/23 0744  GLUCAP 311* 240* 188* 127* 84   Lipid Profile: No results for input(s): "CHOL", "HDL", "LDLCALC", "TRIG", "CHOLHDL", "LDLDIRECT" in the last 72 hours. Thyroid  Function Tests: No results for input(s): "TSH", "T4TOTAL", "FREET4", "T3FREE", "THYROIDAB" in the last 72 hours.  Anemia Panel: No results for input(s): "VITAMINB12", "FOLATE", "FERRITIN", "TIBC", "IRON ", "RETICCTPCT"  in the last 72 hours. Sepsis Labs: Recent Labs  Lab 12/03/23 1755  PROCALCITON 0.13    Recent Results (from the past 240 hours)  Resp panel by RT-PCR (RSV, Flu A&B, Covid) Anterior Nasal Swab     Status: None   Collection Time: 12/03/23  4:07 PM   Specimen: Anterior Nasal Swab  Result Value Ref Range Status   SARS Coronavirus 2 by RT PCR NEGATIVE NEGATIVE Final    Comment: (NOTE) SARS-CoV-2 target nucleic acids are NOT DETECTED.  The SARS-CoV-2 RNA is generally detectable in upper respiratory specimens during the acute phase of infection. The lowest concentration of SARS-CoV-2 viral copies this assay can detect is 138 copies/mL. A negative result does not preclude SARS-Cov-2 infection and should not be used as the sole basis for treatment or other patient management decisions. A negative result may occur with  improper specimen collection/handling, submission of specimen other than nasopharyngeal swab, presence of viral mutation(s) within the areas targeted by this assay, and inadequate number of viral copies(<138 copies/mL). A negative result must be combined with clinical observations, patient history, and epidemiological information. The expected result is Negative.  Fact Sheet for Patients:  BloggerCourse.com  Fact Sheet for Healthcare Providers:  SeriousBroker.it  This test is no t yet approved or cleared by the [place]  FDA and  has been authorized for detection and/or diagnosis of SARS-CoV-2 by FDA under an Emergency Use Authorization (EUA). This EUA will remain  in effect (meaning this test can be used) for the duration of the COVID-19 declaration under Section 564(b)(1) of the Act, 21 U.S.C.section 360bbb-3(b)(1), unless the authorization is terminated  or revoked sooner.       Influenza A by PCR NEGATIVE NEGATIVE Final   Influenza B by PCR NEGATIVE NEGATIVE Final    Comment: (NOTE) The Xpert Xpress  SARS-CoV-2/FLU/RSV plus assay is intended as an aid in the diagnosis of influenza from Nasopharyngeal swab specimens and should not be used as a sole basis for treatment. Nasal washings and aspirates are unacceptable for Xpert Xpress SARS-CoV-2/FLU/RSV testing.  Fact Sheet for Patients: BloggerCourse.com  Fact Sheet for Healthcare Providers: SeriousBroker.it  This test is not yet approved or cleared by the [place]  FDA and has been authorized for detection and/or diagnosis of SARS-CoV-2 by FDA under an Emergency Use Authorization (EUA). This EUA will remain  in effect (meaning this test can be used) for the duration of the COVID-19 declaration under Section 564(b)(1) of the Act, 21 U.S.C. section 360bbb-3(b)(1), unless the authorization is terminated or revoked.     Resp Syncytial Virus by PCR NEGATIVE NEGATIVE Final    Comment: (NOTE) Fact Sheet for Patients: BloggerCourse.com  Fact Sheet for Healthcare Providers: SeriousBroker.it  This test is not yet approved or cleared by the [place]  FDA and has been authorized for detection and/or diagnosis of SARS-CoV-2 by FDA under an Emergency Use Authorization (EUA). This EUA will remain in effect (meaning this test can be used) for the duration of the COVID-19 declaration under Section 564(b)(1) of the Act, 21 U.S.C. section 360bbb-3(b)(1), unless the authorization is terminated or revoked.  Performed at [facility], [address], [place], [place] [address]   MRSA Next Gen by PCR, Nasal     Status: None   Collection Time: 12/03/23 11:21 PM   Specimen: Nasal Mucosa; Nasal Swab  Result Value Ref Range Status   MRSA by PCR Next Gen NOT DETECTED NOT DETECTED Final    Comment: (NOTE) The GeneXpert MRSA Assay (FDA approved for NASAL specimens only), is one component of a comprehensive MRSA colonization  surveillance program. It is not intended to diagnose MRSA infection nor to guide or monitor treatment for MRSA infections. Test performance is not FDA approved in patients less than 110 years old. Performed at [facility], [address], [place], [place] [address]   Aerobic/Anaerobic Culture w Gram Stain (surgical/deep wound)     Status: None (Preliminary result)   Collection Time: 12/04/23  4:45 PM   Specimen: Pleural Fluid  Result Value Ref Range Status   Specimen Description   Final    PLEURAL LEFT Performed at [facility], [address], [place], [place] [address]    Special Requests   Final    NONE Performed at [facility], [address], [place], [place] [address]    Gram Stain   Final    ABUNDANT WBC PRESENT, PREDOMINANTLY PMN NO ORGANISMS SEEN Performed at [facility], [address] [place], [place] [address]    Culture   Final    RARE Consistent with normal respiratory flora. Normal respiratory flora-no Staph aureus or Pseudomonas seen NO ANAEROBES ISOLATED; CULTURE IN PROGRESS FOR 5 DAYS    Report Status PENDING  Incomplete    Radiology Studies: DG CHEST PORT 1 VIEW Result Date: 12/06/2023 CLINICAL DATA:  [id number] Pleural effusion on left [id number] EXAM: PORTABLE CHEST 1 VIEW COMPARISON:  Dec 05, 2023, eighth 2025 FINDINGS: The cardiomediastinal silhouette is unchanged in contour.LEFT-sided pigtail catheter projecting over the LEFT upper lung. LEFT-sided cardiac pacing device. Overall decrease in LEFT apical pleural collection since May eighth but similar in comparison most recent prior. Similar scarring along the RIGHT lung base with pleural blunting. No significant pneumothorax. Diffuse RIGHT-sided pleural calcifications with similar appearance of RIGHT apical thickening. Similar appearance of patchy bibasilar opacities on a background of emphysematous changes IMPRESSION: Overall decrease in LEFT apical pleural  collection since May eighth but similar in comparison most recent prior. Electronically Signed   By: Clancy Crimes M.D.   On: 12/06/2023 11:25   DG CHEST PORT 1 VIEW Result Date: 12/05/2023 CLINICAL DATA:  Pleural effusion. EXAM: PORTABLE CHEST 1 VIEW COMPARISON:  Radiographic chest CT 12/03/2023 FINDINGS: Diminished loculated left pleural effusion at the apex. Visible pneumothorax. Stable chronic lung disease elsewhere with  calcified pleural plaques, facet Dema and irregular pulmonary densities. Stable heart size and mediastinal contours. IMPRESSION: 1. Diminished loculated left pleural effusion at the apex. No pneumothorax. 2. Stable chronic lung disease. Electronically Signed   By: Chadwick Colonel M.D.   On: 12/05/2023 12:20   Scheduled Meds:  atorvastatin   40 mg Oral Daily   carvedilol   3.125 mg Oral BID WC   Chlorhexidine  Gluconate Cloth  6 each Topical Q2200   guaiFENesin   600 mg Oral BID   insulin  aspart  0-9 Units Subcutaneous Q4H   insulin  glargine-yfgn  15 Units Subcutaneous QHS   levothyroxine   125 mcg Oral Q0600   multivitamin with minerals  1 tablet Oral Daily   predniSONE   10 mg Oral Q breakfast   pregabalin   75 mg Oral BID   Ensure Max Protein  11 oz Oral BID   tamsulosin   0.4 mg Oral QHS   tranexamic acid   500 mg Nebulization Q8H   traZODone   50 mg Oral QHS   umeclidinium-vilanterol  1 puff Inhalation Daily   Continuous Infusions:     LOS: 4 days    Time spent: 35 mins    Magdalene School, MD [facility]   If 7PM-7AM, please contact night-coverage

## 2023-12-07 NOTE — Progress Notes (Signed)
 NAME:  [name], MRN:  [id number], DOB:  Jul 16, 1941, LOS: 4 ADMISSION DATE:  12/03/2023, CONSULTATION DATE: 5/8 REFERRING MD: Dr. Tamela Fake, CHIEF COMPLAINT: Hemoptysis  History of Present Illness:  83 year old male with multiple medical problems as outlined below which are significant for severe COPD, chronic hypoxic respiratory failure on 2 L at home, obstructive sleep apnea on CPAP, rheumatoid arthritis, and atrial fibrillation.  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  He presented to the pulmonary clinic on 5/8 with complaints of hemoptysis.  Chest x-ray showed dense consolidation versus effusion versus atelectasis of the left upper lobe.  CT angiogram on 4/22 showed a loculated effusion in that location.  Patient complains of cough with large chunks of bright red blood but not lower than 1/4 cup. He was felt to be high risk of decompensation and was referred to the ED for further care. PCCM was consulted.   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), Anxiety, Aortic aneurysm (HCC) (09/02/2017), BENIGN PROSTATIC HYPERTROPHY (11/23/2009), Cardiomyopathy (HCC) (08/28/2016), Chronic systolic CHF (congestive heart failure) (HCC) (09/02/2017), COPD (chronic obstructive pulmonary disease) (HCC), CORONARY ARTERY DISEASE (11/23/2009), DECREASED HEARING, LEFT EAR (03/01/2010), DEGENERATIVE JOINT DISEASE (11/23/2009), DEPRESSION (11/23/2009), FATIGUE (11/23/2009), GAIT DISTURBANCE (12/10/2009), HEMOPTYSIS UNSPECIFIED (05/07/2010), High cholesterol, HYPERTENSION (07/30/2009), HYPOTHYROIDISM (07/30/2009), Ischemic cardiomyopathy (09/02/2017), LUMBAR RADICULOPATHY, 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   Significant Hospital Events: Including procedures, antibiotic start and stop dates in addition to other pertinent events   5/8 admit 5/9 CT guided chest tube placed by IR, aspirated blood concerning for hemothorax 5/10 of chest tube output 5/11 drain to 125 mL chest x-ray had improved 5/12, he has put out another 125 mL last night  Interim History / Subjective:  Feeling better, oxygen  had been titrated up last night chest x-ray from the 11th look like element of edema on top of his fibrosis  Objective    Blood pressure (!) 183/112, pulse 61, temperature 97.7 F (36.5 C), temperature source Oral, resp. rate (!) 21, height 6\' 2"  (1.88 m), weight 82.6 kg, SpO2 96%.        Intake/Output Summary (Last 24 hours) at 12/07/2023 1005 Last data filed at 12/07/2023 0719 Gross per 24 hour  Intake --  Output 1875 ml  Net -1875 ml   Filed Weights   12/03/23 1515 12/03/23 2321  Weight: 86.2 kg 82.6 kg    Examination: General Sitting up in bed no acute distress HEENT normocephalic atraumatic no jugular venous distention appreciated Pulmonary some crackles in the bases no accessory use currently on 8 L/min nasal cannula, I am changed him to 6 L, saturation still above 92% Portable chest x-ray personally reviewed, from 5/11 the left chest tube remains apically placed,There is bilateral volume loss which appears to be element of both atelectasis and possible edema The left chest tube is draining bloody pleural output still, he is put out a total of 490 mL of output since chest tube insertion as of 5/12 Regular irregular without murmur rub or gallop Abdomen soft not tender Extremities are warm with dependent edema Neuro  intact    Resolved Hospital Problem list:   Hemoptysis- TXA nebs x 3, then  stop Assessment & Plan:   Chronic hypoxic respiratory failure COPD Pulmonary edema Obstructive sleep apnea on nocturnal CPAP History of pulmonary fibrosis Rheumatoid arthritis Traumatic hemothorax, left Status post left thoracostomy tube Atrial fibrillation  History of fall Coronary artery disease Hypothyroidism Type 2 diabetes  Pulmonary problem list s/p thoracostomy tube for Hemothorax  -felt 2/2 falls Plan chest tube to remain in place until draining or less Hold plavix , last dose 5/8 AM Chest x-ray in the morning  Chronic hypoxemic respiratory failure COPD without acute exacerbation, as well as pulmonary fibrosis OSA on CPAP Chronically on 2-3L home oxygen  therapy, it appears as though he has got some underlying fibrotic changes but also element of acute edema He required increased supplemental oxygen  yesterday on 5/11 titrating this down again but does appear like there is element of  Plan Sat goal 88-95%, supplemental O2 titrated to goal. CPAP at HS Anoro in place of home stiolto IV Lasix  x 1 A.m. chest x-ray  History of atrial fibrillation Plan Rate control No anticoagulation given history of hemothorax and hemoptysis Remainder per primary   Best Practice (right click and "Reselect all SmartList Selections" daily)   Per primary  Critical care time: Not applicable

## 2023-12-07 NOTE — Plan of Care (Signed)
  Problem: Fluid Volume: Goal: Ability to maintain a balanced intake and output will improve Outcome: Progressing   Problem: Metabolic: Goal: Ability to maintain appropriate glucose levels will improve Outcome: Progressing   Problem: Education: Goal: Knowledge of General Education information will improve Description: Including pain rating scale, medication(s)/side effects and non-pharmacologic comfort measures Outcome: Progressing   Problem: Health Behavior/Discharge Planning: Goal: Ability to manage health-related needs will improve Outcome: Progressing   Problem: Clinical Measurements: Goal: Cardiovascular complication will be avoided Outcome: Progressing   Problem: Elimination: Goal: Will not experience complications related to urinary retention Outcome: Progressing   Problem: Pain Managment: Goal: General experience of comfort will improve and/or be controlled Outcome: Progressing   Problem: Safety: Goal: Ability to remain free from injury will improve Outcome: Progressing

## 2023-12-08 ENCOUNTER — Encounter (HOSPITAL_COMMUNITY)

## 2023-12-08 ENCOUNTER — Inpatient Hospital Stay (HOSPITAL_COMMUNITY)

## 2023-12-08 DIAGNOSIS — J942 Hemothorax: Secondary | ICD-10-CM

## 2023-12-08 DIAGNOSIS — R042 Hemoptysis: Secondary | ICD-10-CM | POA: Diagnosis not present

## 2023-12-08 LAB — GLUCOSE, CAPILLARY
Glucose-Capillary: 63 mg/dL — ABNORMAL LOW (ref 70–99)
Glucose-Capillary: 63 mg/dL — ABNORMAL LOW (ref 70–99)
Glucose-Capillary: 65 mg/dL — ABNORMAL LOW (ref 70–99)
Glucose-Capillary: 98 mg/dL (ref 70–99)
Glucose-Capillary: 80 mg/dL (ref 70–99)
Glucose-Capillary: 188 mg/dL — ABNORMAL HIGH (ref 70–99)
Glucose-Capillary: 385 mg/dL — ABNORMAL HIGH (ref 70–99)
Glucose-Capillary: 259 mg/dL — ABNORMAL HIGH (ref 70–99)
Glucose-Capillary: 143 mg/dL — ABNORMAL HIGH (ref 70–99)

## 2023-12-08 LAB — CBC
HCT: 42.1 % (ref 39.0–52.0)
Hemoglobin: 13 g/dL (ref 13.0–17.0)
MCH: 28.3 pg (ref 26.0–34.0)
MCHC: 30.9 g/dL (ref 30.0–36.0)
MCV: 91.5 fL (ref 80.0–100.0)
Platelets: 161 10*3/uL (ref 150–400)
RBC: 4.6 MIL/uL (ref 4.22–5.81)
RDW: 14.6 % (ref 11.5–15.5)
WBC: 9.7 10*3/uL (ref 4.0–10.5)
nRBC: 0 % (ref 0.0–0.2)

## 2023-12-08 LAB — MAGNESIUM: Magnesium: 1.6 mg/dL — ABNORMAL LOW (ref 1.7–2.4)

## 2023-12-08 LAB — BASIC METABOLIC PANEL WITH GFR
Anion gap: 7 (ref 5–15)
BUN: 45 mg/dL — ABNORMAL HIGH (ref 8–23)
CO2: 30 mmol/L (ref 22–32)
Calcium: 9.2 mg/dL (ref 8.9–10.3)
Chloride: 102 mmol/L (ref 98–111)
Creatinine, Ser: 1.83 mg/dL — ABNORMAL HIGH (ref 0.61–1.24)
GFR, Estimated: 36 mL/min — ABNORMAL LOW (ref >60)
Glucose, Bld: 70 mg/dL (ref 70–99)
Potassium: 5.3 mmol/L — ABNORMAL HIGH (ref 3.5–5.1)
Sodium: 139 mmol/L (ref 135–145)

## 2023-12-08 LAB — POTASSIUM: Potassium: 4.6 mmol/L (ref 3.5–5.1)

## 2023-12-08 LAB — PHOSPHORUS: Phosphorus: 3 mg/dL (ref 2.5–4.6)

## 2023-12-08 MED ORDER — INSULIN ASPART 100 UNIT/ML IJ SOLN
0.0000 [IU] | Freq: Three times a day (TID) | INTRAMUSCULAR | Status: DC
  Administered 2023-12-08: 2 [IU] via SUBCUTANEOUS
  Administered 2023-12-08: 9 [IU] via SUBCUTANEOUS

## 2023-12-08 MED ORDER — MAGNESIUM SULFATE 2 GM/50ML IV SOLN
2.0000 g | Freq: Once | INTRAVENOUS | Status: AC
  Administered 2023-12-08: 2 g via INTRAVENOUS
  Filled 2023-12-08: qty 50

## 2023-12-08 MED ORDER — MAGNESIUM SULFATE 2 GM/50ML IV SOLN: 2.0000 g | Freq: Once | INTRAVENOUS | Status: DC

## 2023-12-08 MED ORDER — INSULIN GLARGINE-YFGN 100 UNIT/ML ~~LOC~~ SOLN
12.0000 [IU] | Freq: Every day | SUBCUTANEOUS | Status: DC
  Administered 2023-12-08: 12 [IU] via SUBCUTANEOUS
  Filled 2023-12-08 (×2): qty 0.12

## 2023-12-08 MED ORDER — INSULIN ASPART 100 UNIT/ML IJ SOLN: 0.0000 [IU] | Freq: Every day | INTRAMUSCULAR | Status: DC

## 2023-12-08 MED ORDER — FUROSEMIDE 10 MG/ML IJ SOLN
40.0000 mg | Freq: Once | INTRAMUSCULAR | Status: AC
  Administered 2023-12-08: 40 mg via INTRAVENOUS
  Filled 2023-12-08: qty 4

## 2023-12-08 NOTE — Progress Notes (Signed)
 eLink Physician-Brief Progress Note Patient Name: [name] DOB: 1940/10/09 MRN: [id number]   Date of Service  12/08/2023  HPI/Events of Note  Notified of K at 5.3, Mg 1.6, crea 1.83 <-- 1.48.   Pt given oral potassium 40meq on 12/07/23.  eICU Interventions  Replete Mg.  Recheck K within the day.      Intervention Category Intermediate Interventions: Electrolyte abnormality - evaluation and management  Lanell Pinta 12/08/2023, 4:22 AM

## 2023-12-08 NOTE — Progress Notes (Signed)
 Chest tube removed. Occlusive dressing placed.  Pt tolerated well   Hadley Leu ACNP-BC [facility] Pulmonary/Critical Care Pager # [phone] OR # [phone] if no answer

## 2023-12-08 NOTE — Progress Notes (Addendum)
 NAME:  [name], MRN:  [id number], DOB:  01/11/1941, LOS: 5 ADMISSION DATE:  12/03/2023, CONSULTATION DATE: 5/8 REFERRING MD: Dr. Tamela Fake, CHIEF COMPLAINT: Hemoptysis  History of Present Illness:  83 year old male with multiple medical problems as outlined below which are significant for severe COPD, chronic hypoxic respiratory failure on 2 L at home, obstructive sleep apnea on CPAP, rheumatoid arthritis, and atrial fibrillation.  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  He presented to the pulmonary clinic on 5/8 with complaints of hemoptysis.  Chest x-ray showed dense consolidation versus effusion versus atelectasis of the left upper lobe.  CT angiogram on 4/22 showed a loculated effusion in that location.  Patient complains of cough with large chunks of bright red blood but not lower than 1/4 cup. He was felt to be high risk of decompensation and was referred to the ED for further care. PCCM was consulted.   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), Anxiety, Aortic aneurysm (HCC) (09/02/2017), BENIGN PROSTATIC HYPERTROPHY (11/23/2009), Cardiomyopathy (HCC) (08/28/2016), Chronic systolic CHF (congestive heart failure) (HCC) (09/02/2017), COPD (chronic obstructive pulmonary disease) (HCC), CORONARY ARTERY DISEASE (11/23/2009), DECREASED HEARING, LEFT EAR (03/01/2010), DEGENERATIVE JOINT DISEASE (11/23/2009), DEPRESSION (11/23/2009), FATIGUE (11/23/2009), GAIT DISTURBANCE (12/10/2009), HEMOPTYSIS UNSPECIFIED (05/07/2010), High cholesterol, HYPERTENSION (07/30/2009), HYPOTHYROIDISM (07/30/2009), Ischemic cardiomyopathy (09/02/2017), LUMBAR RADICULOPATHY, 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   Significant Hospital Events: Including procedures, antibiotic start and stop dates in addition to other pertinent events   5/8 admit 5/9 CT guided chest tube placed by IR, aspirated blood concerning for hemothorax 5/10 of chest tube output 5/11 drain to 125 mL chest x-ray had improved 5/12, he has put out another 125 mL last night 5/13: Only 40 mL of output from chest tube.  Removing.  Getting an additional dose of Lasix   Interim History / Subjective:  Feels better  Objective    Blood pressure 129/67, pulse 67, temperature 97.6 F (36.4 C), temperature source Oral, resp. rate 17, height 6\' 2"  (1.88 m), weight 82.6 kg, SpO2 92%.        Intake/Output Summary (Last 24 hours) at 12/08/2023 1052 Last data filed at 12/08/2023 0901 Gross per 24 hour  Intake 290 ml  Output 2520 ml  Net -2230 ml   Filed Weights   12/03/23 1515 12/03/23 2321  Weight: 86.2 kg 82.6 kg    Examination: General he sitting up at bedside no acute distress actually feels a little better today HEENT normocephalic atraumatic no jugular venous distention is appreciated Pulmonary: Diminished in the bases some faint expiratory wheezing noted.  He has put out a total of 40 cc of pleural fluid since my last evaluation on 5/13, therefore meeting requirements for removal Portable chest x-ray personally reviewed: Has a pacemaker in place, the apically placed chest tube looks essentially the same however it is hidden behind the pacemaker, aeration looks a little improved following diuresis Cardiac irregular irregular with atrial fibrillation on telemetry Abdomen soft nontender no organomegaly Extremities warm dry scattered areas of ecchymosis some moderate peripheral edema Neuro awake oriented no focal deficits appreciated.  Resolved Hospital Problem list:   Hemoptysis- TXA nebs x 3, then stop Assessment & Plan:   Chronic hypoxic respiratory failure COPD Pulmonary edema Obstructive sleep apnea on nocturnal CPAP History of pulmonary fibrosis CKD stage IIIa Rheumatoid arthritis Traumatic hemothorax, left Status post left thoracostomy tube Atrial fibrillation  History of fall Coronary artery disease Hypothyroidism Type 2 diabetes  Pulmonary problem list s/p thoracostomy tube for Hemothorax  -felt 2/2 falls, pleural output 40 mL since 5/12 Plan Remove chest tube today Plavix  has been on hold since the eighth, will discuss timing of when to safely resume.  Would favor at least 48-72 hours post chest tube removal  Chronic hypoxemic respiratory failure COPD without acute exacerbation, as well as pulmonary fibrosis OSA on CPAP Chronically on 2-3L home oxygen  therapy, it appears as though he has got some underlying fibrotic changes but also element of acute edema He required increased supplemental oxygen  yesterday on 5/11 titrating this down again but does appear like there is element of  Plan Sat goal 88-95%, supplemental O2 titrated to goal. CPAP at HS Anoro in place of home stiolto Repeat Lasix  today, slight baseline serum creatinine in the 1.7 range roughly.  He peaked as high as 2.15, holding underwent about 1.8 which reflects an increase from creatinine on 5/13.  If creatinine continues to rise after this dose of Lasix  would hold off until renal function reequilibrates Cont chronic pred  History of atrial fibrillation Plan Rate control No anticoagulation given history of hemothorax and hemoptysis  Remainder per primary   Best Practice (right click and "Reselect all SmartList Selections" daily)   Per primary  Critical care time: Not applicable

## 2023-12-08 NOTE — Progress Notes (Addendum)
 Hypoglycemic Event  CBG: 63  Treatment: 4 oz juice/soda  Symptoms: None  Follow-up CBG: Time:0345 CBG Result: 98  Possible Reasons for Event: Unknown  Comments/MD notified: floor coverage NP notified via secure chat    Murriel Arrow

## 2023-12-08 NOTE — Plan of Care (Signed)
  Problem: Coping: Goal: Ability to adjust to condition or change in health will improve Outcome: Progressing   Problem: Fluid Volume: Goal: Ability to maintain a balanced intake and output will improve Outcome: Progressing   Problem: Metabolic: Goal: Ability to maintain appropriate glucose levels will improve Outcome: Progressing   Problem: Nutritional: Goal: Maintenance of adequate nutrition will improve Outcome: Progressing   Problem: Skin Integrity: Goal: Risk for impaired skin integrity will decrease Outcome: Progressing   Problem: Tissue Perfusion: Goal: Adequacy of tissue perfusion will improve Outcome: Progressing   Problem: Clinical Measurements: Goal: Ability to maintain clinical measurements within normal limits will improve Outcome: Progressing Goal: Will remain free from infection Outcome: Progressing   Problem: Activity: Goal: Risk for activity intolerance will decrease Outcome: Progressing   Problem: Coping: Goal: Level of anxiety will decrease Outcome: Progressing   Problem: Safety: Goal: Ability to remain free from injury will improve Outcome: Progressing   Problem: Skin Integrity: Goal: Risk for impaired skin integrity will decrease Outcome: Progressing

## 2023-12-08 NOTE — Progress Notes (Signed)
 PROGRESS NOTE    [name]  RUE:[id number] DOB: 02-May-1941 DOA: 12/03/2023 PCP: Adelia Homestead, MD   Brief Narrative:  This 83 yrs old male with PMH significant for COPD on 3L of O2, DM2, OSA on CPAP, rheumatoid arthritis, lung nodules , CHF, CAD, CKD, A-fib ,ITP on chronic steroids, left common femoral to popliteal bypass in January 2022, pacemaker, status post autologous stem cell transplant for COPD in 2017, ascending aortic aneurysm presented in the ED with hemoptysis.  Patient reports 2 days ago, He has blood with phlegm now coughing up blood clots. He was seen by pulmonologist who sent him to the ED.  Patient is not on anticoagulation due to ITP.  He is on Plavix  due to CAD.  Patient was hypoxic with SpO2 of 89% on 3 L, improved on 4 L to 94%.  Patient was admitted for further evaluation.  Assessment & Plan:   Principal Problem:   Hemoptysis Active Problems:   Diabetes mellitus with complication, with long-term current use of insulin  (HCC)   COPD (chronic obstructive pulmonary disease) (HCC)   Hypothyroidism   Acute on chronic respiratory failure (HCC)   OSA on CPAP   PAF (paroxysmal atrial fibrillation) (HCC)   Lung mass   Idiopathic thrombocytopenic purpura (ITP) (HCC)   AKI (acute kidney injury) (HCC)   Lung nodules   Pacemaker   Rheumatoid arthritis (HCC)   S/P femoral-tibial bypass   QT prolongation   Pleural effusion   Malnutrition of moderate degree   Need for management of chest tube   Hemothorax on left   Acute respiratory failure with hypoxia (HCC)    Hemoptysis: Hemothorax : Patient presented with hemoptysis for 2 days. His SpO2 is 88 to 95% on 4 L.  Continue supplemental oxygen . Continue tranexamic acid  nebs.  No further hemoptysis since admission. CT chest shows left upper lobe opacity appears loculated effusion. He fell about a week ago and landed on the left side so this may be hemothorax. IR consulted.  Patient underwent CT-guided left  chest tube placement. Chest tube connected with the drainage system.  Fluid sent for cytology and culture Management per Critical care.  Chest tube to remain until draining 100 mL or less. Fluid shows normal respiratory flora. Pulmonology plans for chest tube removal today.  COPD: Continue home meds. Appreciated Pulmonology consult.   Diabetes mellitus with hyperglycemia: Continue Semglee  12 units daily. Continue sensitive regular sliding scale. Hold p.o. diabetic medications.   Hemoglobin A1c 8.5 better than before.   Idiopathic thrombocytopenic purpura (ITP) (HCC) Continue chronic steroids prednisone  10 mg poq day.   OSA on CPAP: Continue CPAP at night.   Pacemaker: Chronic,  stable   PAF (paroxysmal atrial fibrillation) (HCC) Continue Coreg  3.125 mg every 12 hours. Hold Eliquis  in the setting of hemoptysis.   Rheumatoid arthritis (HCC) Hold arava  for now.   S/P femoral-tibial bypass: Hold Plavix .   Lung nodules: Appreciate pulmonology consult. Needs outpatient follow-up.   Acute on chronic respiratory failure [facility]): Patient uses supplemental oxygen  2 L at baseline,  now requiring 4 L likely due to hemoptysis.   Continue supplemental oxygen  and wean as tolerated.[name]  AKI (acute kidney injury) (HCC) > Improving Baseline creatinine normal,  presented with creatinine 2.15 Continue IV hydration.  Serum creatinine improving. Creatinine up today 1.83   QT prolongation Avoid QT prolonging medications, rehydrate and correct electrolytes. Repeat EKG.   Hypothyroidism: Continue Synthroid  125 mcg daily.   Lung mass / Loculated Effusion: CT scan showing  potential mass although loculated hemorrhage is also possibility.   Appreciate pulmonology consult.   IR consulted. Patient underwent CT-guided left chest tube placement. Chest tube connected with the drainage system.  Fluid sent for cytology and culture Management per Critical care.  DVT prophylaxis: SCDs Code  Status: DNR Family Communication: No family at bedside Disposition Plan:    Status is: Inpatient Remains inpatient appropriate because: Admitted for hemoptysis,  pulmonology is consulted. Patient underwent CT-guided left chest tube placement. Chest tube connected with the drainage system.  Fluid sent for cytology and culture Pulmonology planning to remove chest tube today.  Anticipated discharge home tomorrow.   Consultants:  Pulmonology  Procedures:CT chest  Antimicrobials:  Anti-infectives (From admission, onward)    None      Subjective: Seen and examined at bedside. Overnight events noted. Patient reports feeling much better.  He was having breakfast. Chest tube is connected with the drainage system,  has minimal output.  Objective: Vitals:   12/08/23 0800 12/08/23 0831 12/08/23 0900 12/08/23 1120  BP: 129/67     Pulse: 67     Resp: 16  17   Temp:    (!) 97.5 F (36.4 C)  TempSrc:    Oral  SpO2: 94% 92%    Weight:      Height:        Intake/Output Summary (Last 24 hours) at 12/08/2023 1134 Last data filed at 12/08/2023 1120 Gross per 24 hour  Intake 290 ml  Output 2720 ml  Net -2430 ml   Filed Weights   12/03/23 1515 12/03/23 2321  Weight: 86.2 kg 82.6 kg    Examination:  General exam: Appears calm and comfortable, deconditioned, not in any acute distress. Respiratory system: Decreased breath sounds. Respiratory effort normal.  RR 15. Chest wall: Chest tube connected on left side with bloody fluid. Cardiovascular system: S1 & S2 heard, RRR. No JVD, murmurs, rubs, gallops or clicks.  Gastrointestinal system: Abdomen is non distended, soft and non tender. Normal bowel sounds heard. Central nervous system: Alert and oriented x 3. No focal neurological deficits. Extremities: No edema, no cyanosis, no clubbing. Skin: No rashes, lesions or ulcers Psychiatry: Judgement and insight appear normal. Mood & affect appropriate.   Data Reviewed: I have personally  reviewed following labs and imaging studies  CBC: Recent Labs  Lab 12/01/23 1337 12/03/23 1607 12/04/23 0032 12/05/23 0303 12/06/23 0254 12/08/23 0304  WBC 10.9* 11.3* 9.9 10.9* 9.8 9.7  NEUTROABS 8.9*  --   --   --   --   --   HGB 12.4* 11.4* 12.0* 11.1* 12.6* 13.0  HCT 37.8* 36.7* 39.3 36.5* 40.5 42.1  MCV 86.1 90.8 92.5 93.4 92.3 91.5  PLT 150 150 147* 152 171 161   Basic Metabolic Panel: Recent Labs  Lab 12/03/23 1755 12/04/23 0032 12/04/23 0835 12/05/23 0303 12/06/23 0254 12/07/23 0322 12/08/23 0304 12/08/23 0809  NA 134* 131*  --  140  --   --  139  --   K 4.5 5.2* 4.8 4.1  --   --  5.3* 4.6  CL 98 96*  --  106  --   --  102  --   CO2 22 19*  --  24  --   --  30  --   GLUCOSE 152* 252*  --  150*  --   --  70  --   BUN 52* 57*  --  62*  --   --  45*  --  CREATININE 2.12* 2.15*  --  1.96* 1.66* 1.48* 1.83*  --   CALCIUM  8.5* 8.6*  --  8.9  --   --  9.2  --   MG 1.7 1.8  --  2.1  --   --  1.6*  --   PHOS 4.3 5.9*  --  3.6  --   --  3.0  --    GFR: Estimated Creatinine Clearance: 36.2 mL/min (A) (by C-G formula based on SCr of 1.83 mg/dL (H)). Liver Function Tests: Recent Labs  Lab 12/03/23 1755 12/04/23 0032  AST 24 24  ALT 16 17  ALKPHOS 46 53  BILITOT 1.1 1.6*  PROT 6.6 7.0  ALBUMIN  3.5 3.4*   No results for input(s): "LIPASE", "AMYLASE" in the last 168 hours. No results for input(s): "AMMONIA" in the last 168 hours. Coagulation Profile: Recent Labs  Lab 12/04/23 0032  INR 1.1   Cardiac Enzymes: Recent Labs  Lab 12/04/23 0032  CKTOTAL 56   BNP (last 3 results) No results for input(s): "PROBNP" in the last 8760 hours. HbA1C: No results for input(s): "HGBA1C" in the last 72 hours.  CBG: Recent Labs  Lab 12/08/23 0304 12/08/23 0310 12/08/23 0312 12/08/23 0406 12/08/23 0732  GLUCAP 63* 63* 65* 98 80   Lipid Profile: No results for input(s): "CHOL", "HDL", "LDLCALC", "TRIG", "CHOLHDL", "LDLDIRECT" in the last 72 hours. Thyroid   Function Tests: No results for input(s): "TSH", "T4TOTAL", "FREET4", "T3FREE", "THYROIDAB" in the last 72 hours.  Anemia Panel: No results for input(s): "VITAMINB12", "FOLATE", "FERRITIN", "TIBC", "IRON ", "RETICCTPCT" in the last 72 hours. Sepsis Labs: Recent Labs  Lab 12/03/23 1755  PROCALCITON 0.13    Recent Results (from the past 240 hours)  Resp panel by RT-PCR (RSV, Flu A&B, Covid) Anterior Nasal Swab     Status: None   Collection Time: 12/03/23  4:07 PM   Specimen: Anterior Nasal Swab  Result Value Ref Range Status   SARS Coronavirus 2 by RT PCR NEGATIVE NEGATIVE Final    Comment: (NOTE) SARS-CoV-2 target nucleic acids are NOT DETECTED.  The SARS-CoV-2 RNA is generally detectable in upper respiratory specimens during the acute phase of infection. The lowest concentration of SARS-CoV-2 viral copies this assay can detect is 138 copies/mL. A negative result does not preclude SARS-Cov-2 infection and should not be used as the sole basis for treatment or other patient management decisions. A negative result may occur with  improper specimen collection/handling, submission of specimen other than nasopharyngeal swab, presence of viral mutation(s) within the areas targeted by this assay, and inadequate number of viral copies(<138 copies/mL). A negative result must be combined with clinical observations, patient history, and epidemiological information. The expected result is Negative.  Fact Sheet for Patients:  BloggerCourse.com  Fact Sheet for Healthcare Providers:  SeriousBroker.it  This test is no t yet approved or cleared by the [place]  FDA and  has been authorized for detection and/or diagnosis of SARS-CoV-2 by FDA under an Emergency Use Authorization (EUA). This EUA will remain  in effect (meaning this test can be used) for the duration of the COVID-19 declaration under Section 564(b)(1) of the Act,  21 U.S.C.section 360bbb-3(b)(1), unless the authorization is terminated  or revoked sooner.       Influenza A by PCR NEGATIVE NEGATIVE Final   Influenza B by PCR NEGATIVE NEGATIVE Final    Comment: (NOTE) The Xpert Xpress SARS-CoV-2/FLU/RSV plus assay is intended as an aid in the diagnosis of influenza from Nasopharyngeal swab specimens and  should not be used as a sole basis for treatment. Nasal washings and aspirates are unacceptable for Xpert Xpress SARS-CoV-2/FLU/RSV testing.  Fact Sheet for Patients: BloggerCourse.com  Fact Sheet for Healthcare Providers: SeriousBroker.it  This test is not yet approved or cleared by the [place]  FDA and has been authorized for detection and/or diagnosis of SARS-CoV-2 by FDA under an Emergency Use Authorization (EUA). This EUA will remain in effect (meaning this test can be used) for the duration of the COVID-19 declaration under Section 564(b)(1) of the Act, 21 U.S.C. section 360bbb-3(b)(1), unless the authorization is terminated or revoked.     Resp Syncytial Virus by PCR NEGATIVE NEGATIVE Final    Comment: (NOTE) Fact Sheet for Patients: BloggerCourse.com  Fact Sheet for Healthcare Providers: SeriousBroker.it  This test is not yet approved or cleared by the [place]  FDA and has been authorized for detection and/or diagnosis of SARS-CoV-2 by FDA under an Emergency Use Authorization (EUA). This EUA will remain in effect (meaning this test can be used) for the duration of the COVID-19 declaration under Section 564(b)(1) of the Act, 21 U.S.C. section 360bbb-3(b)(1), unless the authorization is terminated or revoked.  Performed at [facility], [address] [place], [place] [address]   MRSA Next Gen by PCR, Nasal     Status: None   Collection Time: 12/03/23 11:21 PM   Specimen: Nasal Mucosa; Nasal Swab   Result Value Ref Range Status   MRSA by PCR Next Gen NOT DETECTED NOT DETECTED Final    Comment: (NOTE) The GeneXpert MRSA Assay (FDA approved for NASAL specimens only), is one component of a comprehensive MRSA colonization surveillance program. It is not intended to diagnose MRSA infection nor to guide or monitor treatment for MRSA infections. Test performance is not FDA approved in patients less than 63 years old. Performed at [facility], [address], [place], [place] [address]   Aerobic/Anaerobic Culture w Gram Stain (surgical/deep wound)     Status: None (Preliminary result)   Collection Time: 12/04/23  4:45 PM   Specimen: Pleural Fluid  Result Value Ref Range Status   Specimen Description   Final    PLEURAL LEFT Performed at [facility], [address] [place], [place] [address]    Special Requests   Final    NONE Performed at [facility], [address], [place], [place] [address]    Gram Stain   Final    ABUNDANT WBC PRESENT, PREDOMINANTLY PMN NO ORGANISMS SEEN Performed at [facility], [address], [place], [place] [address]    Culture   Final    RARE Consistent with normal respiratory flora. Normal respiratory flora-no Staph aureus or Pseudomonas seen NO ANAEROBES ISOLATED; CULTURE IN PROGRESS FOR 5 DAYS    Report Status PENDING  Incomplete    Radiology Studies: DG Chest Port 1 View Result Date: 12/08/2023 CLINICAL DATA:  Pleural effusion follow-up EXAM: PORTABLE CHEST 1 VIEW COMPARISON:  Dec 07, 2023 FINDINGS: No change in the chronic interstitial lung disease COPD changes and right apical pleural calcifications and fibro atelectatic changes. Left pleural catheter tip in the left apex behind the left subclavian pacemaker device is now better visualized. Without evidence of residual pneumothorax. No change in the left apical pleural thickening. Heart and mediastinum normal. No change in the left subclavian  bipolar ICDF pacemaker device tip of the leads in good position no evidence of discontinuity IMPRESSION: Left pleural catheter tip in the  left apex behind the left subclavian pacemaker device is now better visualized. No evidence of residual pneumothorax. Electronically Signed   By: Fredrich Jefferson M.D.   On: 12/08/2023 07:45   DG CHEST PORT 1 VIEW Result Date: 12/07/2023 CLINICAL DATA:  Chest tube in place EXAM: PORTABLE CHEST 1 VIEW COMPARISON:  Dec 06, 2023 FINDINGS: No change in the left subclavian bipolar ICDF pacemaker device tip of the leads in good position no evidence of discontinuity No change chronic interstitial lung disease COPD changes. No acute infiltrates consolidations or pulmonary edema No significant pleural effusions or pneumothorax Heart and mediastinum within normal limits in size IMPRESSION: No change in the left subclavian bipolar ICDF pacemaker device tip of the leads in good position no evidence of discontinuity. Left pleural catheter in the left apex the tip of which is obscured by the pacer. No obvious residual pneumothorax. Electronically Signed   By: Fredrich Jefferson M.D.   On: 12/07/2023 15:14   Scheduled Meds:  atorvastatin   40 mg Oral Daily   carvedilol   3.125 mg Oral BID WC   Chlorhexidine  Gluconate Cloth  6 each Topical Q2200   furosemide   40 mg Intravenous Once   guaiFENesin   600 mg Oral BID   insulin  aspart  0-5 Units Subcutaneous QHS   insulin  aspart  0-9 Units Subcutaneous TID WC   insulin  glargine-yfgn  12 Units Subcutaneous QHS   levothyroxine   125 mcg Oral Q0600   multivitamin with minerals  1 tablet Oral Daily   predniSONE   10 mg Oral Q breakfast   pregabalin   75 mg Oral BID   Ensure Max Protein  11 oz Oral BID   tamsulosin   0.4 mg Oral QHS   traZODone   50 mg Oral QHS   umeclidinium-vilanterol  1 puff Inhalation Daily   Continuous Infusions:     LOS: 5 days    Time spent: 35 mins    Magdalene School, MD [facility]   If 7PM-7AM, please  contact night-coverage

## 2023-12-08 NOTE — Inpatient Diabetes Management (Signed)
 Inpatient Diabetes Program Recommendations  AACE/ADA: New Consensus Statement on Inpatient Glycemic Control (2015)  Target Ranges:  Prepandial:   less than 140 mg/dL      Peak postprandial:   less than 180 mg/dL (1-2 hours)      Critically ill patients:  140 - 180 mg/dL   Lab Results  Component Value Date   GLUCAP 80 12/08/2023   HGBA1C 8.6 (H) 12/04/2023    Review of Glycemic Control  Latest Reference Range & Units 12/07/23 23:19 12/08/23 03:04 12/08/23 03:10 12/08/23 03:12 12/08/23 04:06 12/08/23 07:32  Glucose-Capillary 70 - 99 mg/dL 295 (H) 63 (L) 63 (L) 65 (L) 98 80  (H): Data is abnormally high (L): Data is abnormally low Diabetes history: Type 2 DM Outpatient Diabetes medications: Lantus  20 units every day, Novolog  0-12 units TID, Ozempic , Synjardy 25/1000 mg QD Current orders for Inpatient glycemic control: Semglee  15 units at bedtime, Novolog  0-9 units Q4H Prednisone  10 mg QD   Inpatient Diabetes Program Recommendations:     Consider changing correction to TID & HS now that patient eating and decreasing Semglee  12 units at bedtime  Thanks, Marjo Sievert, MSN, RNC-OB Diabetes Coordinator [phone] (8a-5p)

## 2023-12-09 DIAGNOSIS — R042 Hemoptysis: Secondary | ICD-10-CM | POA: Diagnosis not present

## 2023-12-09 LAB — AEROBIC/ANAEROBIC CULTURE W GRAM STAIN (SURGICAL/DEEP WOUND): Culture: NORMAL

## 2023-12-09 LAB — CBC
HCT: 42 % (ref 39.0–52.0)
Hemoglobin: 12.9 g/dL — ABNORMAL LOW (ref 13.0–17.0)
MCH: 27.9 pg (ref 26.0–34.0)
MCHC: 30.7 g/dL (ref 30.0–36.0)
MCV: 90.7 fL (ref 80.0–100.0)
Platelets: 161 10*3/uL (ref 150–400)
RBC: 4.63 MIL/uL (ref 4.22–5.81)
RDW: 14.6 % (ref 11.5–15.5)
WBC: 9.4 10*3/uL (ref 4.0–10.5)
nRBC: 0 % (ref 0.0–0.2)

## 2023-12-09 LAB — BASIC METABOLIC PANEL WITH GFR
Anion gap: 11 (ref 5–15)
BUN: 55 mg/dL — ABNORMAL HIGH (ref 8–23)
CO2: 28 mmol/L (ref 22–32)
Calcium: 9.4 mg/dL (ref 8.9–10.3)
Chloride: 101 mmol/L (ref 98–111)
Creatinine, Ser: 1.65 mg/dL — ABNORMAL HIGH (ref 0.61–1.24)
GFR, Estimated: 41 mL/min — ABNORMAL LOW (ref >60)
Glucose, Bld: 132 mg/dL — ABNORMAL HIGH (ref 70–99)
Potassium: 4.5 mmol/L (ref 3.5–5.1)
Sodium: 140 mmol/L (ref 135–145)

## 2023-12-09 LAB — GLUCOSE, CAPILLARY: Glucose-Capillary: 107 mg/dL — ABNORMAL HIGH (ref 70–99)

## 2023-12-09 LAB — MAGNESIUM: Magnesium: 1.9 mg/dL (ref 1.7–2.4)

## 2023-12-09 LAB — PHOSPHORUS: Phosphorus: 3.1 mg/dL (ref 2.5–4.6)

## 2023-12-09 LAB — CYTOLOGY - NON PAP

## 2023-12-09 MED ORDER — FLUTICASONE PROPIONATE 50 MCG/ACT NA SUSP
1.0000 | Freq: Every day | NASAL | Status: DC | PRN
Start: 2023-12-09 — End: 2024-02-17

## 2023-12-09 NOTE — Progress Notes (Signed)
 Brief PCCM progress note  Chart reviewed this a.m. patient remained stable post chest tube removal, hospitalist service has planned for discharge home.   [name] D. Harris, NP-C [facility] Personal contact information can be found on Amion  If no contact or response made please call [phone] 12/09/2023, 9:52 AM

## 2023-12-09 NOTE — Plan of Care (Signed)
  Problem: Coping: Goal: Ability to adjust to condition or change in health will improve Outcome: Progressing   Problem: Health Behavior/Discharge Planning: Goal: Ability to manage health-related needs will improve Outcome: Progressing   Problem: Skin Integrity: Goal: Risk for impaired skin integrity will decrease Outcome: Progressing   Problem: Tissue Perfusion: Goal: Adequacy of tissue perfusion will improve Outcome: Progressing   Problem: Education: Goal: Knowledge of General Education information will improve Description: Including pain rating scale, medication(s)/side effects and non-pharmacologic comfort measures Outcome: Progressing   Problem: Clinical Measurements: Goal: Ability to maintain clinical measurements within normal limits will improve Outcome: Progressing Goal: Will remain free from infection Outcome: Progressing   Problem: Activity: Goal: Risk for activity intolerance will decrease Outcome: Progressing   Problem: Nutrition: Goal: Adequate nutrition will be maintained Outcome: Progressing   Problem: Safety: Goal: Ability to remain free from injury will improve Outcome: Progressing

## 2023-12-09 NOTE — Discharge Summary (Signed)
 Physician Discharge Summary  [name] XBM:[id number] DOB: February 07, 1941 DOA: 12/03/2023  PCP: Adelia Homestead, MD  Admit date: 12/03/2023 Discharge date: 12/09/2023  Admitted From: Home Disposition: Home  Recommendations for Outpatient Follow-up:  Follow up with PCP in 1 week with repeat CBC/BMP Outpatient follow-up with pulmonary Follow up in ED if symptoms worsen or new appear   Home Health: Home health PT/OT Equipment/Devices: None  Discharge Condition: Stable CODE STATUS: DNR Diet recommendation: Heart healthy/carb modified  Brief/Interim Summary: 83 yrs old male with PMH significant for COPD on 3L of O2, DM2, OSA on CPAP, rheumatoid arthritis, lung nodules , CHF, CAD, CKD, A-fib ,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  He was found to have hemothorax requiring CT-guided chest tube placement on the left side.  Pulmonary followed the patient.  During the hospitalization, his condition has improved.  Chest tube was removed on 12/08/2023.  His respiratory status seems stable.  Pulmonary has cleared the patient for discharge.  He feels much better and feels okay to go home today.  Discharge patient home today with outpatient follow-up with PCP and pulmonary.  Discharge Diagnoses:   Left-sided hemothorax Hemoptysis Acute on chronic respiratory failure with hypoxia - Still requiring 4-5 L oxygen  via nasal cannula.  Presented with hemoptysis. - He was found to have hemothorax requiring CT-guided chest tube placement on the left side.  Pulmonary followed the patient.  During the hospitalization, his condition has improved.  Chest tube was removed on 12/08/2023.  His respiratory status seems stable.  Pulmonary has cleared the patient for discharge.  He feels much better and feels okay to go home today.  Discharge patient home  today with outpatient follow-up with PCP and pulmonary. - Plavix  to remain on hold till reevaluation by PCP.  COPD - Continue current home inhaled regimen.  Outpatient follow-up with pulmonary  OSA on CPAP -continue CPAP at night  Diabetes mellitus type 2 with hyperglycemia - Continue home insulin  regimen.  Hold metformin /empagliflozin till reevaluation by PCP.  Carb modified diet.  Continue Ozempic   Pacemaker - Chronic, stable.  Outpatient follow-up with cardiology  Paroxysmal A-fib - Continue Coreg .  Outpatient follow-up with cardiology  Hyponatremia Resolved  Hyperkalemia Resolved  Hypomagnesemia - Resolved  AKI on CKD stage IIIb - Creatinine improving.  Continue to hold losartan  and metformin .  Outpatient follow-up of BMP.  Hypothyroidism -continue Synthroid   Hyperlipidemia -Continue statin  Acutely prolongation - Outpatient follow-up  Anxiety/depression - Continue sertraline   Discharge Instructions  Discharge Instructions     Diet - low sodium heart healthy   Complete by: As directed    Diet Carb Modified   Complete by: As directed    Increase activity slowly   Complete by: As directed    Pulmonary Visit   Complete by: As directed    Hospital followup   Reason for referral: Other Pulmonary      Allergies as of 12/09/2023   No Known Allergies      Medication List     STOP taking these medications    clopidogrel  75 MG tablet Commonly known as: PLAVIX    Dexcom G7 Sensor Misc   empagliflozin 10 MG Tabs tablet Commonly known as: JARDIANCE   Empagliflozin-metFORMIN  HCl ER 25-1000 MG Tb24   losartan  100 MG tablet Commonly known as: COZAAR    metformin  500 MG (OSM) 24 hr tablet Commonly known  as: FORTAMET        TAKE these medications    acetaminophen  500 MG tablet Commonly known as: TYLENOL  Take 1,000 mg by mouth every 6 (six) hours as needed for mild pain.   albuterol  (2.5 MG/3ML) 0.083% nebulizer solution Commonly known as:  PROVENTIL  Take 3 mLs (2.5 mg total) by nebulization every 6 (six) hours as needed for wheezing or shortness of breath.   albuterol  108 (90 Base) MCG/ACT inhaler Commonly known as: VENTOLIN  HFA INHALE 2 PUFFS INTO THE LUNGS EVERY 6 HOURS AS NEEDED FOR WHEEZING OR SHORTNESS OF BREATH   atorvastatin  40 MG tablet Commonly known as: Lipitor  Take 1 tablet (40 mg total) by mouth daily.   carvedilol  3.125 MG tablet Commonly known as: COREG  TAKE 1 TABLET(3.125 MG) BY MOUTH TWICE DAILY WITH A MEAL What changed: See the new instructions.   cyanocobalamin  500 MCG tablet Commonly known as: VITAMIN B12 Take 500 mcg by mouth daily.   ferrous sulfate  324 (65 Fe) MG Tbec Take 324 mg by mouth daily.   fluticasone  50 MCG/ACT nasal spray Commonly known as: FLONASE  Place 1 spray into both nostrils daily as needed for allergies.   guaifenesin  400 MG Tabs tablet Commonly known as: HUMIBID E Take 400 mg by mouth in the morning and at bedtime.   imiquimod 5 % cream Commonly known as: ALDARA Apply 1 Application topically 3 (three) times a week. APPLY TO WARTS 3-5 TIMES PER WEEK FOR 6 WEEKS OR UNTIL WARTS DISAPPEAR   insulin  aspart 100 UNIT/ML injection Commonly known as: novoLOG  Inject 0-12 Units into the skin 3 (three) times daily before meals.   insulin  glargine 100 UNIT/ML injection Commonly known as: LANTUS  Inject 20 Units into the skin daily.   LUBRICATING EYE DROPS OP Place 1 drop into both eyes 4 (four) times daily as needed (dry eyes).   MEGA MULTIVITAMIN FOR MEN PO Take 1 tablet by mouth daily.   Olodaterol HCl 2.5 MCG/ACT Aers Inhale 2 puffs into the lungs daily at 12 noon.   OXYGEN  Inhale 2 L into the lungs continuous.   OZEMPIC  (1 MG/DOSE) [place] Inject 1 mg into the skin every Friday.   predniSONE  10 MG tablet Commonly known as: DELTASONE  Take 1 tablet (10 mg total) by mouth daily with breakfast.   pregabalin  100 MG capsule Commonly known as: LYRICA  Take 100 mg by mouth  daily.   sertraline  100 MG tablet Commonly known as: ZOLOFT  Take 50 mg by mouth daily.   Stiolto Respimat  2.5-2.5 MCG/ACT Aers Generic drug: Tiotropium Bromide -Olodaterol Inhale 2 puffs into the lungs daily.   Synthroid  125 MCG tablet Generic drug: levothyroxine  Take 125 mcg by mouth daily before breakfast.   tamsulosin  0.4 MG Caps capsule Commonly known as: FLOMAX  Take 1 capsule (0.4 mg total) by mouth at bedtime.   traZODone  50 MG tablet Commonly known as: DESYREL  Take 50 mg by mouth at bedtime.   Vitamin D3 50 MCG (2000 UT) capsule Take 2,000 Units by mouth daily.        Follow-up Information     [facility] Follow up.   Specialty: Home Health Services Contact information: [address] [place] [address] [phone]         Adelia Homestead, MD. Schedule an appointment as soon as possible for a visit in 1 week(s).   Specialty: Internal Medicine Contact information: [address] [place] [address] [phone]  No Known Allergies  Consultations: Pulmonary   Procedures/Studies: DG Chest Port 1 View Result Date: 12/08/2023 CLINICAL DATA:  Pleural effusion follow-up EXAM: PORTABLE CHEST 1 VIEW COMPARISON:  Dec 07, 2023 FINDINGS: No change in the chronic interstitial lung disease COPD changes and right apical pleural calcifications and fibro atelectatic changes. Left pleural catheter tip in the left apex behind the left subclavian pacemaker device is now better visualized. Without evidence of residual pneumothorax. No change in the left apical pleural thickening. Heart and mediastinum normal. No change in the left subclavian bipolar ICDF pacemaker device tip of the leads in good position no evidence of discontinuity IMPRESSION: Left pleural catheter tip in the left apex behind the left subclavian pacemaker device is now better visualized. No evidence of residual pneumothorax. Electronically Signed    By: Fredrich Jefferson M.D.   On: 12/08/2023 07:45   DG CHEST PORT 1 VIEW Result Date: 12/07/2023 CLINICAL DATA:  Chest tube in place EXAM: PORTABLE CHEST 1 VIEW COMPARISON:  Dec 06, 2023 FINDINGS: No change in the left subclavian bipolar ICDF pacemaker device tip of the leads in good position no evidence of discontinuity No change chronic interstitial lung disease COPD changes. No acute infiltrates consolidations or pulmonary edema No significant pleural effusions or pneumothorax Heart and mediastinum within normal limits in size IMPRESSION: No change in the left subclavian bipolar ICDF pacemaker device tip of the leads in good position no evidence of discontinuity. Left pleural catheter in the left apex the tip of which is obscured by the pacer. No obvious residual pneumothorax. Electronically Signed   By: Fredrich Jefferson M.D.   On: 12/07/2023 15:14   DG CHEST PORT 1 VIEW Result Date: 12/06/2023 CLINICAL DATA:  [id number] Pleural effusion on left [id number] EXAM: PORTABLE CHEST 1 VIEW COMPARISON:  Dec 05, 2023, eighth 2025 FINDINGS: The cardiomediastinal silhouette is unchanged in contour.LEFT-sided pigtail catheter projecting over the LEFT upper lung. LEFT-sided cardiac pacing device. Overall decrease in LEFT apical pleural collection since May eighth but similar in comparison most recent prior. Similar scarring along the RIGHT lung base with pleural blunting. No significant pneumothorax. Diffuse RIGHT-sided pleural calcifications with similar appearance of RIGHT apical thickening. Similar appearance of patchy bibasilar opacities on a background of emphysematous changes IMPRESSION: Overall decrease in LEFT apical pleural collection since May eighth but similar in comparison most recent prior. Electronically Signed   By: Clancy Crimes M.D.   On: 12/06/2023 11:25   DG CHEST PORT 1 VIEW Result Date: 12/05/2023 CLINICAL DATA:  Pleural effusion. EXAM: PORTABLE CHEST 1 VIEW COMPARISON:  Radiographic chest CT 12/03/2023  FINDINGS: Diminished loculated left pleural effusion at the apex. Visible pneumothorax. Stable chronic lung disease elsewhere with calcified pleural plaques, facet Dema and irregular pulmonary densities. Stable heart size and mediastinal contours. IMPRESSION: 1. Diminished loculated left pleural effusion at the apex. No pneumothorax. 2. Stable chronic lung disease. Electronically Signed   By: Chadwick Colonel M.D.   On: 12/05/2023 12:20   CT PERC PLEURAL DRAIN W/INDWELL CATH W/IMG GUIDE Result Date: 12/04/2023 INDICATION: 83 year old with an enlarging collection in left upper chest concerning for a complex hemothorax. EXAM: CT-GUIDED PLACEMENT OF LEFT CHEST TUBE TECHNIQUE: Multidetector CT imaging of the chest was performed following the standard protocol without IV contrast. RADIATION DOSE REDUCTION: This exam was performed according to the departmental dose-optimization program which includes automated exposure control, adjustment of the mA and/or kV according to patient size and/or use of iterative reconstruction technique. MEDICATIONS: Fentanyl  50 mcg ANESTHESIA/SEDATION: The patient's  level of consciousness and vital signs were monitored continuously by radiology nursing throughout the procedure under my direct supervision. COMPLICATIONS: None immediate. PROCEDURE: Informed written consent was obtained from the patient after a thorough discussion of the procedural risks, benefits and alternatives. All questions were addressed. Maximal Sterile Barrier Technique was utilized including caps, mask, sterile gowns, sterile gloves, sterile drape, hand hygiene and skin antiseptic. A timeout was performed prior to the initiation of the procedure. Patient was placed on his right side. Ultrasound demonstrated that the collection in the left upper chest was a complex fluid collection. CT images through the chest were obtained. The complex pleural-based collection in the left upper chest was identified. The lateral left  upper chest was prepped with chlorhexidine  and sterile field was created. Skin was anesthetized using 1% lidocaine . Using CT guidance, a Yueh catheter was directed into the collection and dark blood was aspirated. Superstiff Amplatz wire was advanced through the Yueh catheter into the collection. The tract was dilated to accommodate a 12 [place] multipurpose drain. The drain was attached to a chest tube drainage system. Fluid was sent for culture and cytology. Chest tube was sutured to skin. Dressing was placed. FINDINGS: Complex hemothorax in the posterolateral left upper chest. Drain was placed from a lateral approach. Large amount of dark bloody fluid was aspirated. IMPRESSION: CT-guided placement of a chest tube in the left upper chest collection. Findings are suggestive for a hemothorax. Electronically Signed   By: Elene Griffes M.D.   On: 12/04/2023 17:47   CT Angio Chest PE W and/or Wo Contrast Result Date: 12/03/2023 CLINICAL DATA:  Hemoptysis for several days, initial encounter EXAM: CT ANGIOGRAPHY CHEST WITH CONTRAST TECHNIQUE: Multidetector CT imaging of the chest was performed using the standard protocol during bolus administration of intravenous contrast. Multiplanar CT image reconstructions and MIPs were obtained to evaluate the vascular anatomy. RADIATION DOSE REDUCTION: This exam was performed according to the departmental dose-optimization program which includes automated exposure control, adjustment of the mA and/or kV according to patient size and/or use of iterative reconstruction technique. CONTRAST:  60mL OMNIPAQUE  IOHEXOL  350 MG/ML SOLN COMPARISON:  Chest x-ray from earlier in the same day. CT from 10/16/2022 and 11/17/2023 FINDINGS: Cardiovascular: Atherosclerotic calcifications of the thoracic aorta and its branches are noted. Stable saccular aneurysmal dilatation of the distal thoracic aortic arch is noted extending inferiorly. This measures approximately 4.1 cm in greatest dimension.  Descending thoracic aorta shows no aneurysmal dilatation or dissection. The pulmonary artery shows a normal branching pattern bilaterally. No intraluminal filling defect to suggest pulmonary embolism is noted. Mediastinum/Nodes: Thoracic inlet is within normal limits. The esophagus as visualized is unremarkable. Scattered small mediastinal nodes are seen. Lungs/Pleura: Calcified pleural plaques are again seen on the right. Emphysematous changes and areas of scarring are noted within the right lung. Small dependent effusion is noted in the left hemithorax. There is been significant increase in the soft tissue density along the left pleural margin measuring up to 10.3 x 9.5 cm on today's exam. It previously measured 6.7 x 4.3 cm. The nodular areas of enhancement peripherally are again seen. This masslike density displaces the adjacent left upper lobe. Considerable emphysematous changes are seen. Upper Abdomen: Visualized upper abdomen demonstrates multiple gallstones within the gallbladder without complicating factors. Musculoskeletal: Old rib fractures are seen on the left and stable Review of the MIP images confirms the above findings. IMPRESSION: Enlarging soft tissue density in the left apex which has central decreased attenuation but some peripheral nodular areas of  enhancement. The significant enlargement over the past 2 weeks suggests that this likely represents an enlarging effusion/hemothorax. The possibility of neoplasm however is not totally excluded. Further workup is recommended. Chronic calcified pleural plaquing on the right. Cholelithiasis without complicating factors. 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 Continued follow-up as previously described No evidence of pulmonary embolism. Aortic Atherosclerosis (ICD10-I70.0). Electronically Signed   By: Violeta Grey M.D.   On: 12/03/2023 21:05   DG Chest Port 1 View Result Date: 12/03/2023 CLINICAL DATA:  Cough, shortness of  breath. EXAM: PORTABLE CHEST 1 VIEW COMPARISON:  Same day. FINDINGS: Stable cardiomediastinal silhouette. Left-sided pacemaker is unchanged. Calcified pleural plaques are noted in right lung consistent with asbestos exposure. Stable large masslike density is noted in left upper lobe concerning for neoplasm or malignancy. Minimal bibasilar subsegmental atelectasis is noted. Bony thorax is unremarkable. IMPRESSION: Stable large masslike density is noted and left lung apex highly concerning for neoplasm or malignancy. Calcified pleural plaques are noted in right lung concerning for asbestos exposure. Minimal bibasilar subsegmental atelectasis. Electronically Signed   By: Rosalene Colon M.D.   On: 12/03/2023 16:34   DG Chest 2 View Result Date: 12/03/2023 CLINICAL DATA:  Hemoptysis. EXAM: CHEST - 2 VIEW COMPARISON:  Chest radiograph dated 09/19/2022. chest CT dated 11/17/2023. FINDINGS: There is opacification of the left apex concerning for a mass/malignancy. Background of emphysema. Right lung pleural parenchyma calcification. No pneumothorax. The cardiac silhouette is within normal limits. Left pectoral pacemaker device. No acute osseous pathology. IMPRESSION: Opacification of the left apex concerning for a mass/malignancy. Electronically Signed   By: Angus Bark M.D.   On: 12/03/2023 13:46   CT ANGIO CHEST AORTA W/CM & OR WO/CM Result Date: 11/22/2023 CLINICAL DATA:  Aneurysm of ascending aorta EXAM: CT ANGIOGRAPHY CHEST WITH CONTRAST TECHNIQUE: Multidetector CT imaging of the chest was performed using the standard protocol during bolus administration of intravenous contrast. Multiplanar CT image reconstructions and MIPs were obtained to evaluate the vascular anatomy. Multiplanar image (3D post-processing) reconstructions and MIPs were obtained to evaluate the vascular anatomy. RADIATION DOSE REDUCTION: This exam was performed according to the departmental dose-optimization program which includes automated  exposure control, adjustment of the mA and/or kV according to patient size and/or use of iterative reconstruction technique. CONTRAST:  80mL OMNIPAQUE  IOHEXOL  350 MG/ML SOLN COMPARISON:  CT of the chest performed October 16, 2022 FINDINGS: Cardiovascular: Heart size is within normal limits. No pericardial effusion. Sinus of Valsalva: 36 x 32 x 37 mm Sinotubular junction: 28 x 33 mm Tubular ascending thoracic aorta: 33 x 32 mm Distal aortic arch: 41 mm 39 mm Proximal descending thoracic aorta: 32 mm x 29 mm Descending thoracic aorta at the level of the aortic hiatus: 30 mm x 28 mm Main pulmonary artery: The main pulmonary artery is mildly dilated at 38 mm which may represent sequelae of pulmonary arterial hypertension. Mediastinum/Nodes: No evidence of mediastinal lymphadenopathy. Lungs/Pleura: There has been interval development of a focus of pleural thickening in the left superior hemithorax laterally which measures approximately 6.7 x 4.3 cm on axial imaging and was not present on the previous examination. This is intermediate in density. There is also circumferential pleural thickening at this site which is somewhat nodular in contour. Pleural thickening extends the level of the mid left hemithorax. Also noted is a focus of soft tissue attenuation adjacent to the left ventricular apex which is favored to be external from the pericardium. This measures approximately 2.7 x 0.9 cm  on image 95 of series 301. On the right, there are calcified pleural plaques which are grossly similar when compared to the previous exam. Extensive emphysematous changes are present which are broadly speaking similar when compared to the previous examination. Upper Abdomen: Gallstones. Known visceral aneurysms are incompletely captured. Musculoskeletal: Degenerative changes in the thoracic spine, similar. Chronic appearing rib fractures. Review of the MIP images confirms the above findings. IMPRESSION: 1. Interval development of abnormal  pleural thickening in the left hemithorax with additional areas of soft tissue abnormality. Differential considerations include malignant etiologies. Recommend further evaluation by oncology or pulmonology for definitive characterization. 2. 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 3. Recommend semi-annual imaging followup by CTA or MRA and referral to cardiothoracic surgery if not already obtained. This recommendation follows 2010 ACCF/AHA/AATS/ACR/ASA/SCA/SCAI/SIR/STS/SVM Guidelines for the Diagnosis and Management of Patients With Thoracic Aortic Disease. Circulation. 2010; 121: B147-W29. Aortic aneurysm NOS (ICD10-I71.9) 4. Dilation of the main pulmonary artery which can be observed in the setting of pulmonary arterial hypertension. 5. Extensive emphysema. These results will be called to the ordering clinician or representative by the Radiologist Assistant, and communication documented in the PACS or [employer]. Electronically Signed   By: Reagan Camera M.D.   On: 11/22/2023 13:24      Subjective: Patient seen and examined at bedside.  Feels better.  Wants to go home today.  No fever, vomiting, chest pain reported.  Discharge Exam: Vitals:   12/09/23 0800 12/09/23 0840  BP: 129/61   Pulse: 75   Resp: 20   Temp: 97.8 F (36.6 C)   SpO2: 91% 95%    General: Pt is alert, awake, not in acute distress.  Chronically ill and deconditioned.  On 4 to 5 L oxygen  Cardiovascular: rate controlled, S1/S2 + Respiratory: bilateral decreased breath sounds at bases with scattered crackles Abdominal: Soft, NT, ND, bowel sounds + Extremities: Trace lower extremity edema; no cyanosis    The results of significant diagnostics from this hospitalization (including imaging, microbiology, ancillary and laboratory) are listed below for reference.     Microbiology: Recent Results (from the past 240 hours)  Resp panel by RT-PCR (RSV, Flu A&B,  Covid) Anterior Nasal Swab     Status: None   Collection Time: 12/03/23  4:07 PM   Specimen: Anterior Nasal Swab  Result Value Ref Range Status   SARS Coronavirus 2 by RT PCR NEGATIVE NEGATIVE Final    Comment: (NOTE) SARS-CoV-2 target nucleic acids are NOT DETECTED.  The SARS-CoV-2 RNA is generally detectable in upper respiratory specimens during the acute phase of infection. The lowest concentration of SARS-CoV-2 viral copies this assay can detect is 138 copies/mL. A negative result does not preclude SARS-Cov-2 infection and should not be used as the sole basis for treatment or other patient management decisions. A negative result may occur with  improper specimen collection/handling, submission of specimen other than nasopharyngeal swab, presence of viral mutation(s) within the areas targeted by this assay, and inadequate number of viral copies(<138 copies/mL). A negative result must be combined with clinical observations, patient history, and epidemiological information. The expected result is Negative.  Fact Sheet for Patients:  BloggerCourse.com  Fact Sheet for Healthcare Providers:  SeriousBroker.it  This test is no t yet approved or cleared by the [place]  FDA and  has been authorized for detection and/or diagnosis of SARS-CoV-2 by FDA under an Emergency Use Authorization (EUA). This EUA will remain  in effect (meaning this test  can be used) for the duration of the COVID-19 declaration under Section 564(b)(1) of the Act, 21 U.S.C.section 360bbb-3(b)(1), unless the authorization is terminated  or revoked sooner.       Influenza A by PCR NEGATIVE NEGATIVE Final   Influenza B by PCR NEGATIVE NEGATIVE Final    Comment: (NOTE) The Xpert Xpress SARS-CoV-2/FLU/RSV plus assay is intended as an aid in the diagnosis of influenza from Nasopharyngeal swab specimens and should not be used as a sole basis for treatment. Nasal  washings and aspirates are unacceptable for Xpert Xpress SARS-CoV-2/FLU/RSV testing.  Fact Sheet for Patients: BloggerCourse.com  Fact Sheet for Healthcare Providers: SeriousBroker.it  This test is not yet approved or cleared by the [place]  FDA and has been authorized for detection and/or diagnosis of SARS-CoV-2 by FDA under an Emergency Use Authorization (EUA). This EUA will remain in effect (meaning this test can be used) for the duration of the COVID-19 declaration under Section 564(b)(1) of the Act, 21 U.S.C. section 360bbb-3(b)(1), unless the authorization is terminated or revoked.     Resp Syncytial Virus by PCR NEGATIVE NEGATIVE Final    Comment: (NOTE) Fact Sheet for Patients: BloggerCourse.com  Fact Sheet for Healthcare Providers: SeriousBroker.it  This test is not yet approved or cleared by the [place]  FDA and has been authorized for detection and/or diagnosis of SARS-CoV-2 by FDA under an Emergency Use Authorization (EUA). This EUA will remain in effect (meaning this test can be used) for the duration of the COVID-19 declaration under Section 564(b)(1) of the Act, 21 U.S.C. section 360bbb-3(b)(1), unless the authorization is terminated or revoked.  Performed at [facility], [address] [place], [place] [address]   MRSA Next Gen by PCR, Nasal     Status: None   Collection Time: 12/03/23 11:21 PM   Specimen: Nasal Mucosa; Nasal Swab  Result Value Ref Range Status   MRSA by PCR Next Gen NOT DETECTED NOT DETECTED Final    Comment: (NOTE) The GeneXpert MRSA Assay (FDA approved for NASAL specimens only), is one component of a comprehensive MRSA colonization surveillance program. It is not intended to diagnose MRSA infection nor to guide or monitor treatment for MRSA infections. Test performance is not FDA approved in patients less  than 8 years old. Performed at [facility], [address], [place], [place] [address]   Aerobic/Anaerobic Culture w Gram Stain (surgical/deep wound)     Status: None (Preliminary result)   Collection Time: 12/04/23  4:45 PM   Specimen: Pleural Fluid  Result Value Ref Range Status   Specimen Description   Final    PLEURAL LEFT Performed at [facility], [address] [place], [place] [address]    Special Requests   Final    NONE Performed at [facility], [address] [place], [place] [address]    Gram Stain   Final    ABUNDANT WBC PRESENT, PREDOMINANTLY PMN NO ORGANISMS SEEN Performed at [facility], [address], [place], [place] [address]    Culture   Final    RARE Consistent with normal respiratory flora. Normal respiratory flora-no Staph aureus or Pseudomonas seen NO ANAEROBES ISOLATED; CULTURE IN PROGRESS FOR 5 DAYS    Report Status PENDING  Incomplete     Labs: BNP (last 3 results) Recent Labs    12/03/23 1607  BNP 68.4   Basic Metabolic Panel: Recent Labs  Lab 12/03/23 1755 12/04/23 0032 12/04/23 7253 12/05/23 0303  12/06/23 0254 12/07/23 0322 12/08/23 0304 12/08/23 0809 12/09/23 0254  NA 134* 131*  --  140  --   --  139  --  140  K 4.5 5.2* 4.8 4.1  --   --  5.3* 4.6 4.5  CL 98 96*  --  106  --   --  102  --  101  CO2 22 19*  --  24  --   --  30  --  28  GLUCOSE 152* 252*  --  150*  --   --  70  --  132*  BUN 52* 57*  --  62*  --   --  45*  --  55*  CREATININE 2.12* 2.15*  --  1.96* 1.66* 1.48* 1.83*  --  1.65*  CALCIUM  8.5* 8.6*  --  8.9  --   --  9.2  --  9.4  MG 1.7 1.8  --  2.1  --   --  1.6*  --  1.9  PHOS 4.3 5.9*  --  3.6  --   --  3.0  --  3.1   Liver Function Tests: Recent Labs  Lab 12/03/23 1755 12/04/23 0032  AST 24 24  ALT 16 17  ALKPHOS 46 53  BILITOT 1.1 1.6*  PROT 6.6 7.0  ALBUMIN  3.5 3.4*   No results for input(s): "LIPASE", "AMYLASE" in the last 168 hours. No  results for input(s): "AMMONIA" in the last 168 hours. CBC: Recent Labs  Lab 12/04/23 0032 12/05/23 0303 12/06/23 0254 12/08/23 0304 12/09/23 0254  WBC 9.9 10.9* 9.8 9.7 9.4  HGB 12.0* 11.1* 12.6* 13.0 12.9*  HCT 39.3 36.5* 40.5 42.1 42.0  MCV 92.5 93.4 92.3 91.5 90.7  PLT 147* 152 171 161 161   Cardiac Enzymes: Recent Labs  Lab 12/04/23 0032  CKTOTAL 56   BNP: Invalid input(s): "POCBNP" CBG: Recent Labs  Lab 12/08/23 1118 12/08/23 1543 12/08/23 1944 12/08/23 2149 12/09/23 0723  GLUCAP 188* 385* 259* 143* 107*   D-Dimer No results for input(s): "DDIMER" in the last 72 hours. Hgb A1c No results for input(s): "HGBA1C" in the last 72 hours. Lipid Profile No results for input(s): "CHOL", "HDL", "LDLCALC", "TRIG", "CHOLHDL", "LDLDIRECT" in the last 72 hours. Thyroid  function studies No results for input(s): "TSH", "T4TOTAL", "T3FREE", "THYROIDAB" in the last 72 hours.  Invalid input(s): "FREET3" Anemia work up No results for input(s): "VITAMINB12", "FOLATE", "FERRITIN", "TIBC", "IRON ", "RETICCTPCT" in the last 72 hours. Urinalysis    Component Value Date/Time   COLORURINE YELLOW 12/03/2023 2305   APPEARANCEUR HAZY (A) 12/03/2023 2305   LABSPEC 1.011 12/03/2023 2305   PHURINE 5.0 12/03/2023 2305   GLUCOSEU >=500 (A) 12/03/2023 2305   GLUCOSEU 100 (A) 10/02/2016 1045   HGBUR NEGATIVE 12/03/2023 2305   BILIRUBINUR NEGATIVE 12/03/2023 2305   BILIRUBINUR negative 05/27/2018 1205   KETONESUR 5 (A) 12/03/2023 2305   PROTEINUR 30 (A) 12/03/2023 2305   UROBILINOGEN 0.2 05/27/2018 1205   UROBILINOGEN 0.2 10/02/2016 1045   NITRITE NEGATIVE 12/03/2023 2305   LEUKOCYTESUR LARGE (A) 12/03/2023 2305   Sepsis Labs Recent Labs  Lab 12/05/23 0303 12/06/23 0254 12/08/23 0304 12/09/23 0254  WBC 10.9* 9.8 9.7 9.4   Microbiology Recent Results (from the past 240 hours)  Resp panel by RT-PCR (RSV, Flu A&B, Covid) Anterior Nasal Swab     Status: None   Collection Time:  12/03/23  4:07 PM   Specimen: Anterior Nasal Swab  Result Value Ref Range Status  SARS Coronavirus 2 by RT PCR NEGATIVE NEGATIVE Final    Comment: (NOTE) SARS-CoV-2 target nucleic acids are NOT DETECTED.  The SARS-CoV-2 RNA is generally detectable in upper respiratory specimens during the acute phase of infection. The lowest concentration of SARS-CoV-2 viral copies this assay can detect is 138 copies/mL. A negative result does not preclude SARS-Cov-2 infection and should not be used as the sole basis for treatment or other patient management decisions. A negative result may occur with  improper specimen collection/handling, submission of specimen other than nasopharyngeal swab, presence of viral mutation(s) within the areas targeted by this assay, and inadequate number of viral copies(<138 copies/mL). A negative result must be combined with clinical observations, patient history, and epidemiological information. The expected result is Negative.  Fact Sheet for Patients:  BloggerCourse.com  Fact Sheet for Healthcare Providers:  SeriousBroker.it  This test is no t yet approved or cleared by the [place]  FDA and  has been authorized for detection and/or diagnosis of SARS-CoV-2 by FDA under an Emergency Use Authorization (EUA). This EUA will remain  in effect (meaning this test can be used) for the duration of the COVID-19 declaration under Section 564(b)(1) of the Act, 21 U.S.C.section 360bbb-3(b)(1), unless the authorization is terminated  or revoked sooner.       Influenza A by PCR NEGATIVE NEGATIVE Final   Influenza B by PCR NEGATIVE NEGATIVE Final    Comment: (NOTE) The Xpert Xpress SARS-CoV-2/FLU/RSV plus assay is intended as an aid in the diagnosis of influenza from Nasopharyngeal swab specimens and should not be used as a sole basis for treatment. Nasal washings and aspirates are unacceptable for Xpert Xpress  SARS-CoV-2/FLU/RSV testing.  Fact Sheet for Patients: BloggerCourse.com  Fact Sheet for Healthcare Providers: SeriousBroker.it  This test is not yet approved or cleared by the [place]  FDA and has been authorized for detection and/or diagnosis of SARS-CoV-2 by FDA under an Emergency Use Authorization (EUA). This EUA will remain in effect (meaning this test can be used) for the duration of the COVID-19 declaration under Section 564(b)(1) of the Act, 21 U.S.C. section 360bbb-3(b)(1), unless the authorization is terminated or revoked.     Resp Syncytial Virus by PCR NEGATIVE NEGATIVE Final    Comment: (NOTE) Fact Sheet for Patients: BloggerCourse.com  Fact Sheet for Healthcare Providers: SeriousBroker.it  This test is not yet approved or cleared by the [place]  FDA and has been authorized for detection and/or diagnosis of SARS-CoV-2 by FDA under an Emergency Use Authorization (EUA). This EUA will remain in effect (meaning this test can be used) for the duration of the COVID-19 declaration under Section 564(b)(1) of the Act, 21 U.S.C. section 360bbb-3(b)(1), unless the authorization is terminated or revoked.  Performed at [facility], [address] [place], [place] [address]   MRSA Next Gen by PCR, Nasal     Status: None   Collection Time: 12/03/23 11:21 PM   Specimen: Nasal Mucosa; Nasal Swab  Result Value Ref Range Status   MRSA by PCR Next Gen NOT DETECTED NOT DETECTED Final    Comment: (NOTE) The GeneXpert MRSA Assay (FDA approved for NASAL specimens only), is one component of a comprehensive MRSA colonization surveillance program. It is not intended to diagnose MRSA infection nor to guide or monitor treatment for MRSA infections. Test performance is not FDA approved in patients less than 82 years old. Performed at [facility], [address] [place], [place] [address]   Aerobic/Anaerobic Culture w Gram  Stain (surgical/deep wound)     Status: None (Preliminary result)   Collection Time: 12/04/23  4:45 PM   Specimen: Pleural Fluid  Result Value Ref Range Status   Specimen Description   Final    PLEURAL LEFT Performed at [facility], [address] [place], [place] [address]    Special Requests   Final    NONE Performed at [facility], [address] [place], [place] [address]    Gram Stain   Final    ABUNDANT WBC PRESENT, PREDOMINANTLY PMN NO ORGANISMS SEEN Performed at [facility], [address] [place], [place] [address]    Culture   Final    RARE Consistent with normal respiratory flora. Normal respiratory flora-no Staph aureus or Pseudomonas seen NO ANAEROBES ISOLATED; CULTURE IN PROGRESS FOR 5 DAYS    Report Status PENDING  Incomplete     Time coordinating discharge: 35 minutes  SIGNED:   Audria Leather, MD  [facility] 12/09/2023, 9:31 AM

## 2023-12-09 NOTE — TOC Transition Note (Signed)
 Transition of Care [facility]) - Discharge Note   Patient Details  Name: [name] MRN: [id number] Date of Birth: Dec 06, 1940  [facility]) CM/SW Contact:  Amaryllis Junior, LCSW Phone Number: 12/09/2023, 10:58 AM   Clinical Narrative:    Pt medically ready to dc home with HH. HHPT/OT setup through [facility]. No further TOC needs.   Final next level of care: Home w Home Health Services Barriers to Discharge: Continued Medical Work up   Patient Goals and CMS Choice Patient states their goals for this hospitalization and ongoing recovery are:: return home CMS Medicare.gov Compare Post Acute Care list provided to:: Patient Choice offered to / list presented to : Patient [place] ownership interest in [facility].provided to:: Patient    Discharge Placement                       Discharge Plan and Services Additional resources added to the After Visit Summary for   In-house Referral: NA   Post Acute Care Choice: Home Health          DME Arranged: N/A DME Agency: NA       HH Arranged: OT, PT HH Agency: [facility] Date [facility] Agency Contacted: 12/06/23 Time HH Agency Contacted: 1525 Representative spoke with at [facility] Agency: Randel Buss  Social Drivers of Health (SDOH) Interventions SDOH Screenings   Food Insecurity: No Food Insecurity (12/04/2023)  Housing: Low Risk  (12/04/2023)  Transportation Needs: No Transportation Needs (12/04/2023)  Utilities: Not At Risk (12/04/2023)  Alcohol  Screen: Low Risk  (01/06/2023)  Depression (PHQ2-9): Low Risk  (04/21/2023)  Financial Resource Strain: Low Risk  (01/06/2023)  Physical Activity: Inactive (01/06/2023)  Social Connections: Socially Isolated (12/04/2023)  Stress: No Stress Concern Present (01/06/2023)  Tobacco Use: Medium Risk (12/03/2023)     Readmission Risk Interventions    12/06/2023    3:24 PM  Readmission Risk Prevention Plan  Transportation Screening Complete  PCP or Specialist Appt within 5-7  Days Complete  Home Care Screening Complete  Medication Review (RN CM) Complete

## 2023-12-10 ENCOUNTER — Telehealth: Payer: Self-pay | Admitting: *Deleted

## 2023-12-10 NOTE — Transitions of Care (Post Inpatient/ED Visit) (Signed)
 12/10/2023  Name: [name] MRN: [id number] DOB: [birth year]  Today'Aguilar TOC FU Call Status: Today'Aguilar TOC FU Call Status:: Successful TOC FU Call Completed TOC FU Call Complete Date: 12/10/23 Patient'Aguilar Name and Date of Birth confirmed.  Transition Care Management Follow-up Telephone Call Date of Discharge: 12/09/23 Discharge Facility: [facility]) Type of Discharge: Inpatient Admission Primary Inpatient Discharge Diagnosis:: Hemoptysis- hemothorax requiring chest tube placement How have you been since you were released from the hospital?: Better ("I am doing okay so far; feeling good and not having any issues; I am still completely independent, but my daughter is doing all the driving now to take me to appointments.  I don't need home health, so I am not going to do that") Any questions or concerns?: No  Successfully enrolled into TOC 30-day program: PCP made aware  Items Reviewed: Did you receive and understand the discharge instructions provided?: Yes (thoroughly reviewed with patient who verbalizes good understanding of same) Medications obtained,verified, and reconciled?: Yes (Medications Reviewed) (Full medication reconciliation/ review completed; no concerns or discrepancies identified; confirmed patient obtained/ is taking all newly Rx'd medications as instructed; self-manages medications and denies questions/ concerns around medications today) Any new allergies since your discharge?: No Dietary orders reviewed?: Yes Type of Diet Ordered:: "Heart Healthy and low carbohydrate" Do you have support at home?: Yes People in Home [RPT]: alone Name of Support/Comfort Primary Source: Reports independent in self-care activities; supportive local daughter [name] assists as/ if needed/ indicated: very involved in patient'Aguilar care  Medications Reviewed Today: Medications Reviewed Today     Reviewed by Dakota Bacchi M, RN (Registered Nurse) on 12/10/23 at 1134  Med List Status: <None>    Medication Order Taking? Sig Documenting Provider Last Dose Status Informant  acetaminophen  (TYLENOL ) 500 MG tablet [id number] Yes Take 1,000 mg by mouth every 6 (six) hours as needed for mild pain. [provider] Taking Active Multiple Informants, Self, Pharmacy Records, Other  albuterol  (PROVENTIL ) (2.5 MG/3ML) 0.083% nebulizer solution [id number] Yes Take 3 mLs (2.5 mg total) by nebulization every 6 (six) hours as needed for wheezing or shortness of breath. Parrett, Dakota Sav[place], NP Taking Active Multiple Informants, Self, Pharmacy Records, Other           Med Note ([name]   Thu Dec 10, 2023 11:03 AM) 12/10/23: Reports during TOC call, has not needed recently- reports uses rescue inhaler and "gets on top of it" quickly   albuterol  (VENTOLIN  HFA) 108 (90 Base) MCG/ACT inhaler [id number] Yes INHALE 2 PUFFS INTO THE LUNGS EVERY 6 HOURS AS NEEDED FOR WHEEZING OR SHORTNESS OF BREATH Parrett, Dakota S, NP Taking Active Self, Pharmacy Records, Multiple Informants, Other  atorvastatin  (LIPITOR ) 40 MG tablet [id number] Yes Take 1 tablet (40 mg total) by mouth daily. Dakota Haggard, MD Taking Active Multiple Informants, Self, Pharmacy Records, Other  Carboxymethylcellul-Glycerin (LUBRICATING EYE DROPS OP) [id number] Yes Place 1 drop into both eyes 4 (four) times daily as needed (dry eyes).  [provider] Taking Active Multiple Informants, Self, Pharmacy Records, Other  carvedilol  (COREG ) 3.125 MG tablet [id number] Yes TAKE 1 TABLET(3.125 MG) BY MOUTH TWICE DAILY WITH A MEAL  Patient taking differently: Take 3.125 mg by mouth 2 (two) times daily with a meal.   Dakota [place], MD Taking Active Self, Pharmacy Records, Multiple Informants, Other  Cholecalciferol  (VITAMIN D3) 2000 units capsule [id number] Yes Take 2,000 Units by mouth daily.  [provider] Taking Active Multiple Informants, Self, Pharmacy Records, Other  cyanocobalamin  (VITAMIN B12) 500 MCG tablet [id number] Yes  Take 500 mcg by mouth daily. [provider] Taking Active Self, Pharmacy Records, Multiple Informants, Other  ferrous sulfate  324 (65 Fe) MG TBEC [id number] Yes Take 324 mg by mouth daily. [provider] Taking Active Self, Pharmacy Records, Multiple Informants, Other  fluticasone  (FLONASE ) 50 MCG/ACT nasal spray [id number] Yes Place 1 spray into both nostrils daily as needed for allergies. Dakota Leather, MD Taking Active   guaifenesin  (HUMIBID E) 400 MG TABS tablet [id number] Yes Take 400 mg by mouth in the morning and at bedtime. [provider] Taking Active Self, Pharmacy Records, Multiple Informants, Other  imiquimod (ALDARA) 5 % cream [id number] No Apply 1 Application topically 3 (three) times a week. APPLY TO WARTS 3-5 TIMES PER WEEK FOR 6 WEEKS OR UNTIL WARTS DISAPPEAR  Patient not taking: Reported on 12/10/2023   [provider] Not Taking Active Self, Pharmacy Records, Multiple Informants, Other  insulin  aspart (NOVOLOG ) 100 UNIT/ML injection [id number] Yes Inject 0-12 Units into the skin 3 (three) times daily before meals. [provider] Taking Active Self, Pharmacy Records, Multiple Informants, Other  insulin  glargine (LANTUS ) 100 UNIT/ML injection [id number] Yes Inject 20 Units into the skin daily. [provider] Taking Active Multiple Informants, Self, Pharmacy Records, Other  levothyroxine  (SYNTHROID ) 125 MCG tablet [id number] Yes Take 125 mcg by mouth daily before breakfast. [provider] Taking Active Self, Pharmacy Records, Multiple Informants, Other  Multiple Vitamins-Minerals (MEGA MULTIVITAMIN FOR MEN PO) [id number] Yes Take 1 tablet by mouth daily. [provider] Taking Active Multiple Informants, Self, Pharmacy Records, Other  Olodaterol HCl 2.5 MCG/ACT AERS [id number] Yes Inhale 2 puffs into the lungs daily at 12 noon. [provider] Taking Active Self, Pharmacy Records, Multiple Informants, Other   OXYGEN  [id number] Yes Inhale 2 L into the lungs continuous. [provider] Taking Active Multiple Informants, Self, Pharmacy Records, Other  predniSONE  (DELTASONE ) 10 MG tablet [id number] Yes Take 1 tablet (10 mg total) by mouth daily with breakfast. Cameron Cea, MD Taking Active Self, Pharmacy Records, Multiple Informants, Other           Med Note Baltazar Leventhal, Alethia Huxley   Fri Dec 04, 2023 10:28 AM) Dose was recently changed from 15 mg to 10 mg  Recent Dispenses 11/30/2023 10 MG TABS (disp 120, 60d supply) 10/30/2023 5 MG TABS (disp 180, 90d supply) 10/22/2023 20 MG TABS (disp 90, 90d supply) 09/22/2023 5 MG TABS (disp 90, 90d supply) 08/04/2023 20 MG TABS (disp 90, 90d supply)   pregabalin  (LYRICA ) 100 MG capsule [id number] Yes Take 100 mg by mouth daily. [provider] Taking Active Self, Pharmacy Records, Multiple Informants, Other  Semaglutide  (OZEMPIC , 1 MG/DOSE, [place]) [id number] Yes Inject 1 mg into the skin every Friday. [provider] Taking Active Multiple Informants, Self, Pharmacy Records, Other  sertraline  (ZOLOFT ) 100 MG tablet [id number] Yes Take 50 mg by mouth daily. [provider] Taking Active Self, Pharmacy Records, Multiple Informants, Other  tamsulosin  (FLOMAX ) 0.4 MG CAPS capsule [id number] Yes Take 1 capsule (0.4 mg total) by mouth at bedtime. Jobe Mulder, DO Taking Active Multiple Informants, Self, Pharmacy Records, Other  Tiotropium Bromide -Olodaterol (STIOLTO RESPIMAT ) 2.5-2.5 MCG/ACT AERS [id number] No Inhale 2 puffs into the lungs daily.  Patient not taking: Reported on 12/04/2023   Parrett, Dakota [place], NP Not Taking Active Multiple Informants, Self, Pharmacy Records, Other           Med Note (Adilynn Bessey Aguilar   Thu Dec 10, 2023 11:16 AM) 12/10/23: Reports during TOC call he is no longer taking- reports "using another inhaler the pulmonary doctor gave me for maintenance inhaler"  traZODone  (DESYREL ) 50 MG tablet [id number] Yes Take  50 mg by mouth at bedtime. [provider] Taking Active Self, Pharmacy Records, Multiple Informants, Other  Med List Note Horace Lye 02/16/11 1841): Patient uses [facility] in [place] (med order)           Home Care and Equipment/Supplies: Were Home Health Services Ordered?: Yes Name of Home Health Agency:: [facility]- PT/ OT Has Agency set up a time to come to your home?: No EMR reviewed for Home Health Orders: Orders present/patient has not received call (refer to CM for follow-up) (patient reports he does not believe he needs home health PT/ OT- he states he is going to decline services if/ when he is contacted by agency: encouraged patient to reconsider; he will "think about it;" enrolled into TOC 30-day program) Any new equipment or medical supplies ordered?: No  Functional Questionnaire: Do you need assistance with bathing/showering or dressing?: No Do you need assistance with meal preparation?: No Do you need assistance with eating?: No Do you have difficulty maintaining continence: No Do you need assistance with getting out of bed/getting out of a chair/moving?: No Do you have difficulty managing or taking your medications?: No  Follow up appointments reviewed: PCP Follow-up appointment confirmed?: Yes (care coordination outreach in real-time with scheduling care guide to successfully schedule hospital follow up PCP appointment 12/15/23) Date of PCP follow-up appointment?: 12/15/23 Follow-up Provider: PCP- covering provider Specialist Hospital Follow-up appointment confirmed?: No Reason Specialist Follow-Up Not Confirmed: Patient has Specialist Provider Number and will Call for Appointment Do you need transportation to your follow-up appointment?: No Do you understand care options if your condition(Aguilar) worsen?: Yes-patient verbalized understanding  SDOH Interventions Today    Flowsheet Row Most Recent Value  SDOH Interventions   Food Insecurity  Interventions Intervention Not Indicated  Housing Interventions Intervention Not Indicated  Transportation Interventions Intervention Not Indicated  [Daughter provides transportation]  Utilities Interventions Intervention Not Indicated      See TOC assessment tabs for additional assessment/ TOC intervention information  Plan for next week'Aguilar call: Review medication adherence Review PCP Hospital follow up visit provider office visit 12/15/23- labs/ medication changes Review daily weights/ blood sugar trends since Thursday 12/10/23 Reinforce education re: self health management of COPD/ CHF/ DM: daily weights, blood sugars; use of home O2, rescue inhaler Review upcoming provider appointments- ensure pulmonary office visit for hospital follow up scheduled Confirm no new falls/ using assistive devices  Pls call/ message for questions,  Erlene Hawks, RN, BSN, CCRN Alumnus RN Care Manager  Transitions of Care  [facility] - [facility] [phone]: direct office

## 2023-12-10 NOTE — Patient Instructions (Signed)
 Visit Information  Thank you for taking time to visit with me today. Please don't hesitate to contact me if I can be of assistance to you before our next scheduled telephone appointment.  Our next appointment is by telephone on Thursday 12/17/23 at 10:00 am  Please call the care guide team at [phone] if you need to cancel or reschedule your appointment.   Patient Self Care Activities:  Attend all scheduled provider appointments Call provider office for new concerns or questions  Participate in Transition of Care Program/Attend TOC scheduled calls Take medications as prescribed   follow rescue plan if symptoms flare-up- for your breathing Continue pacing activity to avoid episodes of shortness of breath Continue monitoring and recording your blood sugars at home Continue using home oxygen  as prescribed Use assistive devices as needed to prevent falls- your walker Weigh yourself every day to stay on top of early fluid retention: write down your weights every day so you remember what it is from day to day: follow the weight-gain guidelines and action plan to call your doctor if you gain more than 3 lbs overnight, or 5 lbs in one week  Following is a copy of your care plan:   Goals Addressed             This Visit's Progress    [facility] Transitions of Care (TOC) Care Plan       Problems:  Recent Hospitalization for treatment of hemoptysis/ hemothorax with chest tube placement- possibly due to prior mechanical fall "in March 2025"  Home Health services barrier: patient reports plans to decline home health services- enrolled into [facility] 30-day program 1 unplanned hospital admission x last 12 months Independent; lives alone; supportive local daughter involved in patient's routine care- provides transportation  Goal:  Over the next 30 days, the patient will not experience hospital readmission  Interventions:  Transitions of Care:  start date: 12/10/23 Durable Medical Equipment (DME) needs  assessed with patient/caregiver Doctor Visits  - discussed the importance of doctor visits Communication with PCP re: enrollment into TOC 30-day program Arranged PCP follow-up within 7 days (Care Guide Scheduled) Post discharge activity limitations prescribed by provider reviewed Post-op wound/incision care reviewed with patient/caregiver Patient reports planning to decline home health services, stating "don't really think I need it;" role of home health services with with benefit of engagement: he will consider; enrolled into [facility] 30-day program Fall assessment completed; confirmed uses assistive devices on regular basis, at baseline -- walker Provided education around benefit of conservative post-hospital discharge activity; need to pace activity without over-doing, continue using assistive devices  Confirmed patient continues monitoring blood sugars at home using CGM- he declines referral to pharmacy team, states "has been suing it for years and don't have questions;" [facility] Reviewed most recent A1C value from 8.6 on 12/04/23- in setting of chronic prednisone  therapy 8.4; reviewed historical trends over time with patient who verbalizes good understanding of same and general A1-C goals Reinforced blood sugar management at home; need to follow carbohydrate modified diet; use of short- and long- acting insulin - patient verbalizes excellent baseline understanding of same Reviewed recent blood sugars at home: patient reports today, "they typically run lower, but since the hospital visit, they've been running (consistently) in the low 200's" Provided my direct contact information should questions/ concerns/ needs arise post-TOC call, prior to next RN CM telephone visit    COPD Interventions: 12/10/23 Provided instruction about proper use of medications used for management  of COPD including inhalers Advised patient to self assesses COPD action plan zone and  make appointment with provider if in the yellow zone for 48 hours without improvement Discussed the importance of adequate rest and management of fatigue with COPD Screening for signs and symptoms of depression related to chronic disease state  Assessed social determinant of health barriers Confirmed patient routinely monitors/ records daily weights at home, at baseline: reinforced rationale for daily weight monitoring at home along with weight gain guidelines/ action plan for weight gain; importance of taking diuretic as prescribed - he has excellent baseline understanding of same Reviewed recent daily weights at home with patient: reports general weight ranges "in the low 180's lbs" reports "181.5 lbs this am" Confirmed/ reinforced action plan for shortness of breath: verbalizes excellent ongoing understanding/ adherence to same: rest, use of home O2/ rescue inhalers Confirms continuing to use home O2 as per hospital discharge instructions: 2-3 L/min- continuously Provided reinforcement education around basic safe use of home O2- he verbalizes excellent understanding of same   Patient Self Care Activities:  Attend all scheduled provider appointments Call provider office for new concerns or questions  Participate in Transition of Care Program/Attend TOC scheduled calls Take medications as prescribed   follow rescue plan if symptoms flare-up- for your breathing Continue pacing activity to avoid episodes of shortness of breath Continue monitoring and recording your blood sugars at home Continue using home oxygen  as prescribed Use assistive devices as needed to prevent falls- your walker Weigh yourself every day to stay on top of early fluid retention: write down your weights every day so you remember what it is from day to day: follow the weight-gain guidelines and action plan to call your doctor if you gain more than 3 lbs overnight, or 5 lbs in one week  Plan:  Telephone follow up appointment  with care management team member scheduled for:  Thursday 12/17/23 at 10:00 am  Plan for next week's call: Review medication adherence Review PCP Hospital follow up visit provider office visit 12/15/23- labs/ medication changes Review daily weights/ blood sugar trends since Thursday 12/10/23 Reinforce education re: self health management of COPD/ CHF/ DM: daily weights, blood sugars; use of home O2, rescue inhaler Review upcoming provider appointments- ensure pulmonary office visit for hospital follow up scheduled Confirm no new falls/ using assistive devices            Patient verbalizes understanding of instructions and care plan provided today and agrees to view in MyChart. Active MyChart status and patient understanding of how to access instructions and care plan via MyChart confirmed with patient.     Telephone follow up appointment with care management team member scheduled for:  Thursday 12/17/23 at 10:00 am  If you are experiencing a Mental Health or Behavioral Health Crisis or need someone to talk to, please  call the Suicide and Crisis Lifeline: 988 call the [place]  National Suicide Prevention Lifeline: 443-241-3361 or TTY: 724-702-9270 TTY 3083158634) to talk to a trained counselor call 1-800-273-TALK (toll free, 24 hour hotline) go to [facility] [address] [place] [phone]) call the [facility] Crisis Line: [phone] call 911   Erlene Hawks, RN, BSN, CCRN Alumnus RN Care Manager  Transitions of Care  [facility] - Population Health  [place] [phone]: direct office

## 2023-12-12 ENCOUNTER — Other Ambulatory Visit: Payer: Self-pay | Admitting: Cardiology

## 2023-12-12 DIAGNOSIS — I480 Paroxysmal atrial fibrillation: Secondary | ICD-10-CM

## 2023-12-12 DIAGNOSIS — I10 Essential (primary) hypertension: Secondary | ICD-10-CM

## 2023-12-14 ENCOUNTER — Encounter: Payer: Self-pay | Admitting: Hematology and Oncology

## 2023-12-14 DIAGNOSIS — J449 Chronic obstructive pulmonary disease, unspecified: Secondary | ICD-10-CM | POA: Diagnosis not present

## 2023-12-14 DIAGNOSIS — G4733 Obstructive sleep apnea (adult) (pediatric): Secondary | ICD-10-CM | POA: Diagnosis not present

## 2023-12-15 ENCOUNTER — Telehealth: Payer: Self-pay | Admitting: Adult Health

## 2023-12-15 ENCOUNTER — Encounter: Payer: Self-pay | Admitting: Family Medicine

## 2023-12-15 ENCOUNTER — Ambulatory Visit: Admitting: Family Medicine

## 2023-12-15 VITALS — BP 148/88 | HR 57 | Temp 98.7°F | Ht 74.0 in | Wt 193.0 lb

## 2023-12-15 DIAGNOSIS — I5022 Chronic systolic (congestive) heart failure: Secondary | ICD-10-CM

## 2023-12-15 DIAGNOSIS — Z9981 Dependence on supplemental oxygen: Secondary | ICD-10-CM | POA: Diagnosis not present

## 2023-12-15 DIAGNOSIS — J9611 Chronic respiratory failure with hypoxia: Secondary | ICD-10-CM | POA: Diagnosis not present

## 2023-12-15 DIAGNOSIS — N179 Acute kidney failure, unspecified: Secondary | ICD-10-CM | POA: Diagnosis not present

## 2023-12-15 DIAGNOSIS — Z125 Encounter for screening for malignant neoplasm of prostate: Secondary | ICD-10-CM

## 2023-12-15 DIAGNOSIS — D631 Anemia in chronic kidney disease: Secondary | ICD-10-CM

## 2023-12-15 DIAGNOSIS — N1832 Chronic kidney disease, stage 3b: Secondary | ICD-10-CM

## 2023-12-15 DIAGNOSIS — J432 Centrilobular emphysema: Secondary | ICD-10-CM | POA: Diagnosis not present

## 2023-12-15 DIAGNOSIS — I480 Paroxysmal atrial fibrillation: Secondary | ICD-10-CM | POA: Diagnosis not present

## 2023-12-15 DIAGNOSIS — D693 Immune thrombocytopenic purpura: Secondary | ICD-10-CM | POA: Diagnosis not present

## 2023-12-15 DIAGNOSIS — J942 Hemothorax: Secondary | ICD-10-CM

## 2023-12-15 LAB — CBC WITH DIFFERENTIAL/PLATELET
Basophils Absolute: 0.1 10*3/uL (ref 0.0–0.1)
Basophils Relative: 0.5 % (ref 0.0–3.0)
Eosinophils Absolute: 0 10*3/uL (ref 0.0–0.7)
Eosinophils Relative: 0.5 % (ref 0.0–5.0)
HCT: 35.3 % — ABNORMAL LOW (ref 39.0–52.0)
Hemoglobin: 11.6 g/dL — ABNORMAL LOW (ref 13.0–17.0)
Lymphocytes Relative: 11.5 % — ABNORMAL LOW (ref 12.0–46.0)
Lymphs Abs: 1.1 10*3/uL (ref 0.7–4.0)
MCHC: 33 g/dL (ref 30.0–36.0)
MCV: 85.7 fl (ref 78.0–100.0)
Monocytes Absolute: 0.6 10*3/uL (ref 0.1–1.0)
Monocytes Relative: 6.1 % (ref 3.0–12.0)
Neutro Abs: 7.9 10*3/uL — ABNORMAL HIGH (ref 1.4–7.7)
Neutrophils Relative %: 81.4 % — ABNORMAL HIGH (ref 43.0–77.0)
Platelets: 177 10*3/uL (ref 150.0–400.0)
RBC: 4.12 Mil/uL — ABNORMAL LOW (ref 4.22–5.81)
RDW: 15.4 % (ref 11.5–15.5)
WBC: 9.7 10*3/uL (ref 4.0–10.5)

## 2023-12-15 LAB — PSA: PSA: 1.41 ng/mL (ref 0.10–4.00)

## 2023-12-15 NOTE — Telephone Encounter (Signed)
 Yes please set up in next 1-2 weeks can double book for hospital follow up

## 2023-12-15 NOTE — Telephone Encounter (Signed)
 Left message for patient to call back to get scheduled in a week with Roena Clark NP.

## 2023-12-15 NOTE — Progress Notes (Unsigned)
   Established Patient Office Visit  Subjective   Patient ID: [name], male    DOB: 19-May-1941  Age: 83 y.o. MRN: [id number]  Chief Complaint  Patient presents with   Hospitalization Follow-up    Is improving slowly,    HPI Follow up Hospitalization  Patient was admitted to [facility] on 12/03/23 and discharged on 12/09/23. He was treated for hemoptysis, sent to ER from pulmonology office. Treatment for this included chest tube drainage of L hemothorax. Telephone follow up was done on 12/10/23 He reports good compliance with treatment. He reports this condition is improved.   ----------------------------------------------------------------------------------------- -   ROS Per HPI    Objective:     Temp 98.7 F (37.1 C) (Temporal)   Ht 6\' 2"  (1.88 m)   Wt 193 lb (87.5 kg)   BMI 24.78 kg/m   Physical Exam Vitals and nursing note reviewed.  Constitutional:      General: He is not in acute distress.    Appearance: Normal appearance.  HENT:     Head: Normocephalic and atraumatic.     Right Ear: External ear normal.     Left Ear: External ear normal.     Nose: Nose normal.     Mouth/Throat:     Mouth: Mucous membranes are moist.     Pharynx: Oropharynx is clear.  Eyes:     Extraocular Movements: Extraocular movements intact.  Cardiovascular:     Rate and Rhythm: Normal rate and regular rhythm.     Pulses: Normal pulses.     Heart sounds: Normal heart sounds.  Pulmonary:     Effort: Pulmonary effort is normal. No respiratory distress.     Breath sounds: Normal breath sounds. No wheezing, rhonchi or rales.  Musculoskeletal:        General: Normal range of motion.     Cervical back: Normal range of motion.     Right lower leg: No edema.     Left lower leg: No edema.  Lymphadenopathy:     Cervical: No cervical adenopathy.  Skin:    General: Skin is warm and dry.  Neurological:     General: No focal deficit present.     Mental Status: He  is alert and oriented to person, place, and time.  Psychiatric:        Mood and Affect: Mood normal.        Behavior: Behavior normal.     No results found for any visits on 12/15/23.   The ASCVD Risk score (Arnett DK, et al., 2019) failed to calculate for the following reasons:   The 2019 ASCVD risk score is only valid for ages 65 to 58    Assessment & Plan:   Chronic systolic CHF (congestive heart failure) (HCC)  Centrilobular emphysema (HCC)  AKI (acute kidney injury) (HCC) -     Comprehensive metabolic panel with GFR  PAF (paroxysmal atrial fibrillation) (HCC) -     CBC with Differential/Platelet  Chronic respiratory failure with hypoxia (HCC) -     CBC with Differential/Platelet -     Comprehensive metabolic panel with GFR  Idiopathic thrombocytopenic purpura (ITP) (HCC) -     CBC with Differential/Platelet  Oxygen  dependent  Hemothorax  Prostate cancer screening -     PSA     No follow-ups on file.    Wellington Half, FNP

## 2023-12-15 NOTE — Telephone Encounter (Signed)
 Please schedule per [facility]

## 2023-12-16 ENCOUNTER — Ambulatory Visit: Payer: Self-pay | Admitting: Family Medicine

## 2023-12-16 LAB — COMPREHENSIVE METABOLIC PANEL WITH GFR
ALT: 13 U/L (ref 0–53)
AST: 23 U/L (ref 0–37)
Albumin: 3.7 g/dL (ref 3.5–5.2)
Alkaline Phosphatase: 57 U/L (ref 39–117)
BUN: 33 mg/dL — ABNORMAL HIGH (ref 6–23)
CO2: 26 meq/L (ref 19–32)
Calcium: 8.9 mg/dL (ref 8.4–10.5)
Chloride: 98 meq/L (ref 96–112)
Creatinine, Ser: 1.33 mg/dL (ref 0.40–1.50)
GFR: 49.85 mL/min — ABNORMAL LOW (ref >60.00)
Glucose, Bld: 219 mg/dL — ABNORMAL HIGH (ref 70–99)
Potassium: 5 meq/L (ref 3.5–5.1)
Sodium: 137 meq/L (ref 135–145)
Total Bilirubin: 0.7 mg/dL (ref 0.2–1.2)
Total Protein: 6.4 g/dL (ref 6.0–8.3)

## 2023-12-17 ENCOUNTER — Other Ambulatory Visit: Payer: Self-pay | Admitting: *Deleted

## 2023-12-17 NOTE — Transitions of Care (Post Inpatient/ED Visit) (Addendum)
 Transition of Care week 2/ day # 7  Visit Note  12/17/2023  Name: [name] MRN: [id number]          DOB: [birth year]  Situation: Patient enrolled in [facility] 30-day program. Visit completed with patient by telephone.   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   ablated   Anxiety    Aortic aneurysm (HCC) 09/02/2017   BENIGN PROSTATIC HYPERTROPHY 11/23/2009   Cardiomyopathy (HCC) 08/28/2016   Chronic systolic CHF (congestive heart failure) (HCC) 09/02/2017   COPD (chronic obstructive pulmonary disease) (HCC)    CORONARY ARTERY DISEASE 11/23/2009   DECREASED HEARING, 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   DEGENERATIVE JOINT DISEASE 11/23/2009   DEPRESSION 11/23/2009   FATIGUE 11/23/2009   GAIT DISTURBANCE 12/10/2009   HEMOPTYSIS UNSPECIFIED 05/07/2010   High cholesterol    HYPERTENSION 07/30/2009   HYPOTHYROIDISM 07/30/2009   Ischemic cardiomyopathy 09/02/2017   LUMBAR RADICULOPATHY, 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  Had a good visit with Dr. Constance Dellen nurse practitioner on Tuesday.  Nothing is any different--- I just need to know whether to re-start these medications that were stopped when I was sent home from the  hospital;"    Denies clinical concerns and sounds to be in no distress throughout [facility] 30-day program outreach call today  Secure chat message sent to PCP NP to clarify whether re-starting medications that were stopped at time of hospital discharge is indicated/ recommended- did not receive response by end of day: therefore will forward to patient's PCP/ cardiology/ pulmonary providers for their input Patient reports "most of my medications are [facility] by [facility] or [facility]- I don't know who to ask about this, and the discharge papers say my PCP should decide when and if I re-start them" Advised patient to continue following hospital discharge instructions until further instruction/ guidance received from his care providers  Patient Reported Symptoms: Cognitive Cognitive Status: Alert and oriented to person, place, and time, Insightful and able to interpret abstract concepts, Normal speech and language skills Cognitive/Intellectual Conditions Management [RPT]: None reported or documented in medical history or problem list   Health Maintenance Behaviors: Annual physical exam, Healthy diet, Sleep adequate, Hobbies  Neurological Neurological Review of Symptoms: No symptoms reported    HEENT HEENT Symptoms Reported: No symptoms reported      Cardiovascular Cardiovascular Symptoms Reported: No symptoms reported Does patient have uncontrolled Hypertension?: No Cardiovascular Conditions: Hypertension, Heart failure, Dysrhythmia Cardiovascular Management Strategies: Medication therapy, Routine screening, Diet modification, Adequate rest, Coping strategies Weight: 189 lb (85.7 kg) (home reported weight from 12/17/23)  Respiratory Respiratory Symptoms Reported: No symptoms reported Other Respiratory Symptoms: Denies shortness of breath outside of baseline; reports continues using home O2 as  prescribed 2-3 L/min continously; sounds to be in no distress throughout TOC call Respiratory Conditions:  COPD, Shortness of breath, Sleep disordered breathing  Endocrine Patient reports the following symptoms related to hypoglycemia or hyperglycemia : No symptoms reported Is patient diabetic?: Yes Is patient checking blood sugars at home?: Yes Endocrine Conditions: Diabetes, Thyroid  disorder Endocrine Management Strategies: Medical device, Medication therapy, Routine screening, Diet modification, Coping strategies  Gastrointestinal Gastrointestinal Symptoms Reported: No symptoms reported Additional Gastrointestinal Details: Reports "good appetite;" "normal BM's" post-hospital discharge      Genitourinary Genitourinary Symptoms Reported: No symptoms reported Additional Genitourinary Details: Reports voiding "normally, good volume, clear yellow pale urine"    Integumentary Integumentary Symptoms Reported: No symptoms reported    Musculoskeletal Musculoskelatal Symptoms Reviewed: Unsteady gait, Difficulty walking Additional Musculoskeletal Details: Confirms continues using walker "all the time" for ambulation Musculoskeletal Conditions: Mobility limited, Unsteady gait, Other Other Musculoskeletal Conditions: LE PVD Musculoskeletal Management Strategies: Medication therapy, Routine screening, Diet modification, Coping strategies, Medical device, Adequate rest, Weight management      Psychosocial Psychosocial Symptoms Reported: No symptoms reported         There were no vitals filed for this visit.  Medications Reviewed Today     Reviewed by Marlana Mckowen M, RN (Registered Nurse) on 12/17/23 at 1049  Med List Status: <None>   Medication Order Taking? Sig Documenting Provider Last Dose Status Informant  acetaminophen  (TYLENOL ) 500 MG tablet [id number] No Take 1,000 mg by mouth every 6 (six) hours as needed for mild pain. [provider] Taking Active Multiple Informants, Self, Pharmacy Records, Other  albuterol  (PROVENTIL ) (2.5 MG/3ML) 0.083% nebulizer solution [id number] No Take 3  mLs (2.5 mg total) by nebulization every 6 (six) hours as needed for wheezing or shortness of breath. Parrett, Macdonald Savoy, NP Taking Active Multiple Informants, Self, Pharmacy Records, Other           Med Note (Jamelle Goldston M   Thu Dec 10, 2023 11:03 AM) 12/10/23: Reports during TOC call, has not needed recently- reports uses rescue inhaler and "gets on top of it" quickly   albuterol  (VENTOLIN  HFA) 108 (90 Base) MCG/ACT inhaler [id number] No INHALE 2 PUFFS INTO THE LUNGS EVERY 6 HOURS AS NEEDED FOR WHEEZING OR SHORTNESS OF BREATH Parrett, Tammy S, NP Taking Active Self, Pharmacy Records, Multiple Informants, Other  atorvastatin  (LIPITOR ) 40 MG tablet [id number] No Take 1 tablet (40 mg total) by mouth daily. Elmyra Haggard, MD Taking Active Multiple Informants, Self, Pharmacy Records, Other  Carboxymethylcellul-Glycerin (LUBRICATING EYE DROPS OP) [id number] No Place 1 drop into both eyes 4 (four) times daily as needed (dry eyes).  [provider] Taking Active Multiple Informants, Self, Pharmacy Records, Other  carvedilol  (COREG ) 3.125 MG tablet [id number] No TAKE 1 TABLET(3.125 MG) BY MOUTH TWICE DAILY WITH A MEAL  Patient taking differently: Take 3.125 mg by mouth 2 (two) times daily with a meal.   Adelia Homestead, MD Taking Active Self, Pharmacy Records, Multiple Informants, Other  Cholecalciferol  (VITAMIN D3) 2000 units capsule [id number] No Take 2,000 Units by mouth daily.  [provider] Taking Active Multiple Informants, Self, Pharmacy Records, Other  cyanocobalamin  (VITAMIN B12) 500 MCG tablet [id number] No Take 500 mcg by mouth daily. [provider] Taking Active Self, Pharmacy Records, Multiple Informants, Other  ferrous sulfate  324 (65 Fe) MG TBEC [id number] No Take 324 mg by mouth daily. [provider] Taking Active Self, Pharmacy Records, Multiple Informants, Other  fluticasone  (FLONASE ) 50 MCG/ACT nasal spray [id number] No Place  1 spray into both nostrils  daily as needed for allergies. Audria Leather, MD Taking Active   guaifenesin  (HUMIBID E) 400 MG TABS tablet [id number] No Take 400 mg by mouth in the morning and at bedtime. [provider] Taking Active Self, Pharmacy Records, Multiple Informants, Other  imiquimod (ALDARA) 5 % cream [id number] No Apply 1 Application topically 3 (three) times a week. APPLY TO WARTS 3-5 TIMES PER WEEK FOR 6 WEEKS OR UNTIL WARTS DISAPPEAR [provider] Taking Active Self, Pharmacy Records, Multiple Informants, Other  insulin  aspart (NOVOLOG ) 100 UNIT/ML injection [id number] No Inject 0-12 Units into the skin 3 (three) times daily before meals. [provider] Taking Active Self, Pharmacy Records, Multiple Informants, Other  insulin  glargine (LANTUS ) 100 UNIT/ML injection [id number] No Inject 20 Units into the skin daily. [provider] Taking Active Multiple Informants, Self, Pharmacy Records, Other  levothyroxine  (SYNTHROID ) 125 MCG tablet [id number] No Take 125 mcg by mouth daily before breakfast. [provider] Taking Active Self, Pharmacy Records, Multiple Informants, Other  Multiple Vitamins-Minerals (MEGA MULTIVITAMIN FOR MEN PO) [id number] No Take 1 tablet by mouth daily. [provider] Taking Active Multiple Informants, Self, Pharmacy Records, Other  Olodaterol HCl 2.5 MCG/ACT AERS [id number] No Inhale 2 puffs into the lungs daily at 12 noon. [provider] Taking Active Self, Pharmacy Records, Multiple Informants, Other  OXYGEN  [id number] No Inhale 2 L into the lungs continuous. [provider] Taking Active Multiple Informants, Self, Pharmacy Records, Other  predniSONE  (DELTASONE ) 10 MG tablet [id number] No Take 1 tablet (10 mg total) by mouth daily with breakfast. Cameron Cea, MD Taking Active Self, Pharmacy Records, Multiple Informants, Other           Med Note Baltazar Leventhal, Alethia Huxley   Fri Dec 04, 2023 10:28 AM) Dose was recently changed from 15  mg to 10 mg  Recent Dispenses 11/30/2023 10 MG TABS (disp 120, 60d supply) 10/30/2023 5 MG TABS (disp 180, 90d supply) 10/22/2023 20 MG TABS (disp 90, 90d supply) 09/22/2023 5 MG TABS (disp 90, 90d supply) 08/04/2023 20 MG TABS (disp 90, 90d supply)   pregabalin  (LYRICA ) 100 MG capsule [id number] No Take 100 mg by mouth daily. [provider] Taking Active Self, Pharmacy Records, Multiple Informants, Other  Semaglutide  (OZEMPIC , 1 MG/DOSE, [place]) [id number] No Inject 1 mg into the skin every Friday. [provider] Taking Active Multiple Informants, Self, Pharmacy Records, Other  sertraline  (ZOLOFT ) 100 MG tablet [id number] No Take 50 mg by mouth daily. [provider] Taking Active Self, Pharmacy Records, Multiple Informants, Other  tamsulosin  (FLOMAX ) 0.4 MG CAPS capsule [id number] No Take 1 capsule (0.4 mg total) by mouth at bedtime. Jobe Mulder, DO Taking Active Multiple Informants, Self, Pharmacy Records, Other  Tiotropium Bromide -Olodaterol (STIOLTO RESPIMAT ) 2.5-2.5 MCG/ACT AERS [id number] No Inhale 2 puffs into the lungs daily. Parrett, Macdonald Savoy, NP Taking Active Multiple Informants, Self, Pharmacy Records, Other           Med Note (Niah Heinle M   Thu Dec 10, 2023 11:16 AM) 12/10/23: Reports during TOC call he is no longer taking- reports "using another inhaler the pulmonary doctor gave me for maintenance inhaler"  traZODone  (DESYREL ) 50 MG tablet [id number] No Take 50 mg by mouth at bedtime. [provider] Taking Active Self, Pharmacy Records, Multiple Informants, Other  Med List Note Horace Lye 02/16/11 1841): Patient uses [facility] in [place] (med order)           Recommendation:  Specialty provider follow-up 12/29/23- as scheduled/ pulmonary  Follow Up Plan:   Telephone follow-up in 1 week- as scheduled  Plan for next week's call: Review ongoing medication adherence- follow up from providers re: if/ when to re-start  medications held at time of hospital discharge on 12/08/23 Review daily weights/ blood sugar trends since Thursday 12/17/23 Reinforce education re: self health management of COPD/ CHF/ DM: daily weights, blood sugars; use of home O2, rescue inhaler Review upcoming provider appointments- 12/28/23 pulmonary provider Confirm no new falls/ using assistive devices  Pls call/ message for questions,  Valen Mascaro Mckinney Darwin Rothlisberger, RN, BSN, CCRN Alumnus RN Care Manager  Transitions of Care  [facility] - [facility] [phone]: direct office

## 2023-12-17 NOTE — Patient Instructions (Signed)
 Visit Information  Thank you for taking time to visit with me today. Please don't hesitate to contact me if I can be of assistance to you before our next scheduled telephone appointment.  Our next appointment is by telephone on Wednesday 12/23/23 at 10:00 am  Please call the care guide team at [phone] if you need to cancel or reschedule your appointment.   Following are the goals we discussed today:  Patient Self Care Activities:  Attend all scheduled provider appointments Call provider office for new concerns or questions  Participate in Transition of Care Program/Attend TOC scheduled calls Take medications as prescribed   follow rescue plan if symptoms flare-up- for your breathing Continue pacing activity to avoid episodes of shortness of breath Continue monitoring and recording your blood sugars at home Continue using home oxygen  as prescribed Use assistive devices as needed to prevent falls- your walker Weigh yourself every day to stay on top of early fluid retention: write down your weights every day so you remember what it is from day to day: follow the weight-gain guidelines and action plan to call your doctor if you gain more than 3 lbs overnight, or 5 lbs in one week  If you are experiencing a Mental Health or Behavioral Health Crisis or need someone to talk to, please  call the Suicide and Crisis Lifeline: 988 call the [place]  National Suicide Prevention Lifeline: 415-881-7302 or TTY: (281)224-0578 TTY (478)153-4859) to talk to a trained counselor call 1-800-273-TALK (toll free, 24 hour hotline) go to [facility] [address], [place] [phone]) call the [facility] Crisis Line: [phone]) [phone] call 911   Patient verbalizes understanding of instructions and care plan provided today and agrees to view in MyChart. Active MyChart status and patient understanding of how to access instructions and care plan via MyChart confirmed with  patient.     Pls call/ message for questions,  Zahari Fazzino Mckinney Haeli Gerlich, RN, BSN, CCRN Alumnus RN Care Manager  Transitions of Care  [facility] - [facility] [phone]: direct office

## 2023-12-18 ENCOUNTER — Other Ambulatory Visit: Payer: Self-pay | Admitting: *Deleted

## 2023-12-18 ENCOUNTER — Telehealth: Payer: Self-pay | Admitting: Adult Health

## 2023-12-18 NOTE — Patient Instructions (Signed)
 Visit Information  Thank you for taking time to visit with me today. Please don't hesitate to contact me if I can be of assistance to you before our next scheduled telephone appointment.  Dr. Audery Blazing has advised that you re-start the Losartan  and Jardiance; continue to HOLD Plavix  for now; monitor your blood pressures at home, and get lab work in one week, as we discussed today 12/18/23-- I have asked the cardiology team at Dr. Lourdes Roy office to re-add jardiance and losartan  to your medication list and to schedule the lab work for you in one week  Our next appointment is by telephone on Wednesday 12/23/23 at 10:00 am  Please call the care guide team at [phone] if you need to cancel or reschedule your appointment.  Following are the goals we discussed today:  Patient Self Care Activities:  Attend all scheduled provider appointments Call provider office for new concerns or questions  Participate in Transition of Care Program/Attend TOC scheduled calls Take medications as prescribed   follow rescue plan if symptoms flare-up- for your breathing Continue pacing activity to avoid episodes of shortness of breath Continue monitoring and recording your blood sugars at home Continue using home oxygen  as prescribed Use assistive devices as needed to prevent falls- your walker Weigh yourself every day to stay on top of early fluid retention: write down your weights every day so you remember what it is from day to day: follow the weight-gain guidelines and action plan to call your doctor if you gain more than 3 lbs overnight, or 5 lbs in one week Dr. Audery Blazing has advised that you re-start the Losartan  and Jardiance; continue to HOLD Plavix  for now; monitor your blood pressures at home, and get lab work in one week, as we discussed today 12/18/23   If you are experiencing a Mental Health or Behavioral Health Crisis or need someone to talk to, please  call the Suicide and Crisis Lifeline: 988 call  the [place]  National Suicide Prevention Lifeline: 905-782-4669 or TTY: (262)881-6237 TTY (930) 016-4899) to talk to a trained counselor call 1-800-273-TALK (toll free, 24 hour hotline) go to [facility] [address], [place] [phone]) call the [facility] Crisis Line: [phone] call 911   Patient verbalizes understanding of instructions and care plan provided today and agrees to view in MyChart. Active MyChart status and patient understanding of how to access instructions and care plan via MyChart confirmed with patient.     [name] Dakota Vanduzer, RN, BSN, Media planner  Transitions of Care  [facility] - [facility] [phone]: direct office

## 2023-12-18 NOTE — Telephone Encounter (Signed)
 Patient is scheduled 12/29/2023 at 9:30am for HFU with Roena Clark, NP nothing further needed.

## 2023-12-18 NOTE — Transitions of Care (Post Inpatient/ED Visit) (Signed)
 Transition of Care week 2/ day # 8  Visit Note  12/18/2023  Name: [name] MRN: [id number]          DOB: 08-09-1940  Situation: Patient enrolled in [facility] 30-day program. Visit completed with patient by telephone.   HIPAA identifiers x 2 verified  Background:  Recent hospitalization May 8-13, 2025 for hemoptysis with hemothorax requiring chest tube placement Fragile state of health, multiple progressing chronic health conditions- COPD/ DMII; on home O2 at baseline, 2-3 L/min  12/18/23:  Received follow up from cardiology provider, Dr. Audery Blazing re: patient's medication questions posed 12/17/23 during [facility] call:  Dr. Audery Blazing advised that patient resume jardiance and losartan ; continue to hold plavix  "for now;"  recommended patient monitor blood pressures at home, have labs checked "in one week;" Dr. Audery Blazing advised other medications in question should be addressed by other providers; Dr. Audery Blazing copied his cardiology team on his message-- I requested from cardiology team that these instructions/ orders be entered in EHR accordingly; contacted patient and updated him on Dr. 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   ablated   Anxiety    Aortic aneurysm (HCC) 09/02/2017   BENIGN PROSTATIC HYPERTROPHY 11/23/2009   Cardiomyopathy (HCC) 08/28/2016   Chronic systolic CHF (congestive heart failure) (HCC) 09/02/2017   COPD (chronic obstructive pulmonary disease) (HCC)    CORONARY ARTERY DISEASE 11/23/2009   DECREASED HEARING, 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   DEGENERATIVE JOINT DISEASE 11/23/2009   DEPRESSION 11/23/2009   FATIGUE 11/23/2009   GAIT DISTURBANCE 12/10/2009   HEMOPTYSIS UNSPECIFIED 05/07/2010   High cholesterol    HYPERTENSION  07/30/2009   HYPOTHYROIDISM 07/30/2009   Ischemic cardiomyopathy 09/02/2017   LUMBAR RADICULOPATHY, 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   On home oxygen  therapy    "2-3L; 24/7" (09/10/2016)   OSA on CPAP    Pneumonia    PTSD (post-traumatic stress disorder) 03/10/2012   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   RA (rheumatoid arthritis) (HCC) 06/11/2011   "qwhere" (09/10/2016)   RESPIRATORY FAILURE, CHRONIC 07/31/2009   Scleritis of both eyes 03/17/2014   Thrombocytopenia (HCC)    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   Type II diabetes mellitus (HCC)    Assessment:  Patient contacted to update on medication recommendations post- TOC call yesterday where he verbalized questions around which medications he should resume post- hospital discharge instructions to discontinue several of his routine medications prior to hospital admission  Patient sounded to be in no distress throughout [facility] follow up call today, denies clinical concerns verbalized good understanding and was able to repeat instructions with prompting- stated he would resume losartan  and Jardiance as recommended by Dr. Audery Blazing post [facility] care coordination outreach on 12/17/23  Patient Reported Symptoms: Cognitive Cognitive Status: Alert and oriented to person, place, and time, Insightful and able to interpret abstract concepts, Normal speech and language skills Cognitive/Intellectual Conditions Management [RPT]: None reported or documented in medical history or problem list      Neurological Neurological Review of Symptoms: No symptoms reported    HEENT HEENT Symptoms Reported: No symptoms reported      Cardiovascular Cardiovascular Symptoms Reported: No symptoms reported    Respiratory Respiratory Symptoms Reported: No symptoms reported    Endocrine Patient reports the following symptoms related to hypoglycemia or hyperglycemia : No symptoms reported    Gastrointestinal  Gastrointestinal Symptoms Reported: No symptoms reported      Genitourinary Genitourinary Symptoms Reported:  No symptoms reported    Integumentary Integumentary Symptoms Reported: No symptoms reported    Musculoskeletal Musculoskelatal Symptoms Reviewed: No symptoms reported        Psychosocial Psychosocial Symptoms Reported: No symptoms reported         There were no vitals filed for this visit.  Medications Reviewed Today     Reviewed by Emmaly Leech M, RN (Registered Nurse) on 12/18/23 at [phone]  Med List Status: <None>   Medication Order Taking? Sig Documenting Provider Last Dose Status Informant  acetaminophen  (TYLENOL ) 500 MG tablet [id number] No Take 1,000 mg by mouth every 6 (six) hours as needed for mild pain. [provider] Taking Active Multiple Informants, Self, Pharmacy Records, Other  albuterol  (PROVENTIL ) (2.5 MG/3ML) 0.083% nebulizer solution [id number] No Take 3 mLs (2.5 mg total) by nebulization every 6 (six) hours as needed for wheezing or shortness of breath. Parrett, Macdonald Savoy, NP Taking Active Multiple Informants, Self, Pharmacy Records, Other           Med Note (Nikhita Mentzel M   Thu Dec 10, 2023 11:03 AM) 12/10/23: Reports during TOC call, has not needed recently- reports uses rescue inhaler and "gets on top of it" quickly   albuterol  (VENTOLIN  HFA) 108 (90 Base) MCG/ACT inhaler [id number] No INHALE 2 PUFFS INTO THE LUNGS EVERY 6 HOURS AS NEEDED FOR WHEEZING OR SHORTNESS OF BREATH Parrett, Tammy S, NP Taking Active Self, Pharmacy Records, Multiple Informants, Other  atorvastatin  (LIPITOR ) 40 MG tablet [id number] No Take 1 tablet (40 mg total) by mouth daily. Elmyra Haggard, MD Taking Active Multiple Informants, Self, Pharmacy Records, Other  Carboxymethylcellul-Glycerin (LUBRICATING EYE DROPS OP) [id number] No Place 1 drop into both eyes 4 (four) times daily as needed (dry eyes).  [provider] Taking Active Multiple Informants, Self, Pharmacy Records, Other  carvedilol  (COREG ) 3.125 MG tablet [id number] No TAKE 1 TABLET(3.125 MG) BY MOUTH TWICE DAILY WITH A  MEAL  Patient taking differently: Take 3.125 mg by mouth 2 (two) times daily with a meal.   Adelia Homestead, MD Taking Active Self, Pharmacy Records, Multiple Informants, Other  Cholecalciferol  (VITAMIN D3) 2000 units capsule [id number] No Take 2,000 Units by mouth daily.  [provider] Taking Active Multiple Informants, Self, Pharmacy Records, Other  cyanocobalamin  (VITAMIN B12) 500 MCG tablet [id number] No Take 500 mcg by mouth daily. [provider] Taking Active Self, Pharmacy Records, Multiple Informants, Other  ferrous sulfate  324 (65 Fe) MG TBEC [id number] No Take 324 mg by mouth daily. [provider] Taking Active Self, Pharmacy Records, Multiple Informants, Other  fluticasone  (FLONASE ) 50 MCG/ACT nasal spray [id number] No Place 1 spray into both nostrils daily as needed for allergies. Audria Leather, MD Taking Active   guaifenesin  (HUMIBID E) 400 MG TABS tablet [id number] No Take 400 mg by mouth in the morning and at bedtime. [provider] Taking Active Self, Pharmacy Records, Multiple Informants, Other  imiquimod (ALDARA) 5 % cream [id number] No Apply 1 Application topically 3 (three) times a week. APPLY TO WARTS 3-5 TIMES PER WEEK FOR 6 WEEKS OR UNTIL WARTS DISAPPEAR [provider] Taking Active Self, Pharmacy Records, Multiple Informants, Other  insulin  aspart (NOVOLOG ) 100 UNIT/ML injection [id number] No Inject 0-12 Units into the skin 3 (three) times daily before meals. [provider] Taking Active Self, Pharmacy Records, Multiple Informants, Other  insulin  glargine (LANTUS ) 100 UNIT/ML injection [id number] No Inject  20 Units into the skin daily. [provider] Taking Active Multiple Informants, Self, Pharmacy Records, Other  levothyroxine  (SYNTHROID ) 125 MCG tablet [id number] No Take 125 mcg by mouth daily before breakfast. [provider] Taking Active Self, Pharmacy Records, Multiple Informants, Other  Multiple  Vitamins-Minerals (MEGA MULTIVITAMIN FOR MEN PO) [id number] No Take 1 tablet by mouth daily. [provider] Taking Active Multiple Informants, Self, Pharmacy Records, Other  Olodaterol HCl 2.5 MCG/ACT AERS [id number] No Inhale 2 puffs into the lungs daily at 12 noon. [provider] Taking Active Self, Pharmacy Records, Multiple Informants, Other  OXYGEN  [id number] No Inhale 2 L into the lungs continuous. [provider] Taking Active Multiple Informants, Self, Pharmacy Records, Other  predniSONE  (DELTASONE ) 10 MG tablet [id number] No Take 1 tablet (10 mg total) by mouth daily with breakfast. Cameron Cea, MD Taking Active Self, Pharmacy Records, Multiple Informants, Other           Med Note Baltazar Leventhal, Alethia Huxley   Fri Dec 04, 2023 10:28 AM) Dose was recently changed from 15 mg to 10 mg  Recent Dispenses 11/30/2023 10 MG TABS (disp 120, 60d supply) 10/30/2023 5 MG TABS (disp 180, 90d supply) 10/22/2023 20 MG TABS (disp 90, 90d supply) 09/22/2023 5 MG TABS (disp 90, 90d supply) 08/04/2023 20 MG TABS (disp 90, 90d supply)   pregabalin  (LYRICA ) 100 MG capsule [id number] No Take 100 mg by mouth daily. [provider] Taking Active Self, Pharmacy Records, Multiple Informants, Other  Semaglutide  (OZEMPIC , 1 MG/DOSE, Wilder) [id number] No Inject 1 mg into the skin every Friday. [provider] Taking Active Multiple Informants, Self, Pharmacy Records, Other  sertraline  (ZOLOFT ) 100 MG tablet [id number] No Take 50 mg by mouth daily. [provider] Taking Active Self, Pharmacy Records, Multiple Informants, Other  tamsulosin  (FLOMAX ) 0.4 MG CAPS capsule [id number] No Take 1 capsule (0.4 mg total) by mouth at bedtime. Jobe Mulder, DO Taking Active Multiple Informants, Self, Pharmacy Records, Other  Tiotropium Bromide -Olodaterol (STIOLTO RESPIMAT ) 2.5-2.5 MCG/ACT AERS [id number] No Inhale 2 puffs into the lungs daily. Parrett, Macdonald Savoy, NP Taking Active  Multiple Informants, Self, Pharmacy Records, Other           Med Note (Jebidiah Baggerly M   Thu Dec 10, 2023 11:16 AM) 12/10/23: Reports during TOC call he is no longer taking- reports "using another inhaler the pulmonary doctor gave me for maintenance inhaler"  traZODone  (DESYREL ) 50 MG tablet [id number] No Take 50 mg by mouth at bedtime. [provider] Taking Active Self, Pharmacy Records, Multiple Informants, Other  Med List Note Horace Lye 02/16/11 1841): Patient uses [facility] [place] (med order)           Recommendation:   Specialty provider follow-up pulmonary 12/29/23- as scheduled  Follow Up Plan:   Telephone follow-up in 1 week- as scheduled 12/23/23  Plan for next week's call: Review ongoing medication adherence- follow up from providers re: if/ when to re-start medications held at time of hospital discharge on 12/08/23 Review daily weights/ blood sugar trends since Thursday 12/17/23 Reinforce education re: self health management of COPD/ CHF/ DM: daily weights, blood sugars; use of home O2, rescue inhaler Review upcoming provider appointments- 12/28/23 pulmonary provider Confirm no new falls/ using assistive devices  Pls call/ message for questions,  Navy Belay Mckinney Freemon Binford, RN, BSN, CCRN Alumnus RN Care Manager  Transitions of Care  [facility] - [facility] [phone]: direct office

## 2023-12-18 NOTE — Telephone Encounter (Signed)
 Patient requests a call to confirm whether the PET scan is still necessary, since he was recently hospitalized and underwent testing. He is also comfortable waiting until his hospital follow-up with Tammy Parret to clarify this.

## 2023-12-20 ENCOUNTER — Encounter: Payer: Self-pay | Admitting: Family Medicine

## 2023-12-20 NOTE — Assessment & Plan Note (Signed)
 Labs today Continue oxygen  Follow-up pulmonology as scheduled

## 2023-12-20 NOTE — Assessment & Plan Note (Signed)
-   CBC, CMP today.

## 2023-12-20 NOTE — Assessment & Plan Note (Signed)
 PSA today

## 2023-12-20 NOTE — Assessment & Plan Note (Signed)
 Continue oxygen  via nasal cannula 3 LPM Follow-up with pulmonology as scheduled

## 2023-12-20 NOTE — Assessment & Plan Note (Signed)
 Labs today Cont current med regimen, f/u cardio

## 2023-12-20 NOTE — Patient Instructions (Addendum)
 We are checking labs today, will be in contact with any results that require further attention  STOP evening dose of metformin   Continue O2   Continue to hold Plavix  until you are cleared by cardiology and pulmonology to restart.  Continue empagliflozin metformin  in the mornings  Continue to hold losartan  and will recheck kidney function today  Follow-up with PCP in 3 months for follow-up, sooner if needed  Follow-up as soon as possible with cardiology, please do not wait to the end of July to see them.  Follow-up with pulmonology as scheduled.

## 2023-12-20 NOTE — Assessment & Plan Note (Signed)
 Labs today Cont oxygen  F/u pulmonology

## 2023-12-20 NOTE — Assessment & Plan Note (Signed)
 CMP today Increase fluid intake

## 2023-12-20 NOTE — Assessment & Plan Note (Signed)
 Labs today Continue to hold Plavix  until you are cleared by cardiology to restart Follow-up with pulmonology

## 2023-12-20 NOTE — Assessment & Plan Note (Addendum)
 CBC, CMP Conitnue to hold plavix  for now, f/u cardiology as soon as you can

## 2023-12-20 NOTE — Assessment & Plan Note (Signed)
CBC with differential today

## 2023-12-21 ENCOUNTER — Encounter: Payer: Self-pay | Admitting: Cardiology

## 2023-12-21 DIAGNOSIS — I5022 Chronic systolic (congestive) heart failure: Secondary | ICD-10-CM

## 2023-12-21 DIAGNOSIS — I10 Essential (primary) hypertension: Secondary | ICD-10-CM

## 2023-12-22 NOTE — Telephone Encounter (Signed)
 Called and spoke patient, advised I would send a message to [facility] and once we hear back from her we will call him back.  He verbalized understanding.  Tammy, Patient cancelled the PET scan and wants to hold off until his hospital follow up with you on 6/3.  Thank you.

## 2023-12-23 ENCOUNTER — Other Ambulatory Visit: Payer: Self-pay | Admitting: *Deleted

## 2023-12-23 ENCOUNTER — Telehealth: Payer: Self-pay

## 2023-12-23 ENCOUNTER — Telehealth: Admitting: *Deleted

## 2023-12-23 MED ORDER — LOSARTAN POTASSIUM 100 MG PO TABS
100.0000 mg | ORAL_TABLET | Freq: Every day | ORAL | 0 refills | Status: DC
Start: 2023-12-23 — End: 2024-02-17

## 2023-12-23 MED ORDER — EMPAGLIFLOZIN 10 MG PO TABS: 10.0000 mg | ORAL_TABLET | Freq: Every day | ORAL | 3 refills | Status: DC

## 2023-12-23 NOTE — Telephone Encounter (Signed)
 That is fine

## 2023-12-23 NOTE — Telephone Encounter (Signed)
-  Medication Manangement: -pt called concerned about doctors stopping his Plavix  b/c of his recent hospitalization.  He is at home now and is asking for recommendation from Dr. Fulton Job about restarting his Plavix  because keeping his mobility is important to him.  -MD advises  him to start an 81 mg aspirin  daily if able until pulmonology says he can safely restart Plavix .

## 2023-12-23 NOTE — Telephone Encounter (Signed)
 I called and spoke with the pt and notified of response from Tammy. He verbalized understanding. Nothing further needed.

## 2023-12-23 NOTE — Patient Instructions (Signed)
 Visit Information  Thank you for taking time to visit with me today. Please don't hesitate to contact me if I can be of assistance to you before our next scheduled telephone appointment.  Our next appointment is by telephone on Wednesday, December 30, 2023 at 10:00 am  Please call the care guide team at [phone] if you need to cancel or reschedule your appointment.   Following are the goals we discussed today:  Patient Self Care Activities:  Attend all scheduled provider appointments Call provider office for new concerns or questions  Participate in Transition of Care Program/Attend TOC scheduled calls Take medications as prescribed   follow rescue plan if symptoms flare-up- for your breathing Continue pacing activity to avoid episodes of shortness of breath Continue monitoring and recording your blood sugars, daily weights, and blood presures at home Continue using home oxygen  as prescribed Use assistive devices as needed to prevent falls- your walker Weigh yourself every day to stay on top of early fluid retention: write down your weights every day so you remember what it is from day to day: follow the weight-gain guidelines and action plan to call your doctor if you gain more than 3 lbs overnight, or 5 lbs in one week  If you are experiencing a Mental Health or Behavioral Health Crisis or need someone to talk to, please  call the Suicide and Crisis Lifeline: 988 call the [place]  National Suicide Prevention Lifeline: 360-106-2859 or TTY: (414)519-7830 TTY 9475637967) to talk to a trained counselor call 1-800-273-TALK (toll free, 24 hour hotline) go to [facility] [address], [place] [phone]) call the [facility] Crisis Line: [phone] call 911   Patient verbalizes understanding of instructions and care plan provided today and agrees to view in MyChart. Active MyChart status and patient understanding of how to access instructions  and care plan via MyChart confirmed with patient.     [name] Dakota Giaimo, RN, BSN, Media planner  Transitions of Care  [facility] - [facility] [phone]: direct office

## 2023-12-23 NOTE — Transitions of Care (Post Inpatient/ED Visit) (Signed)
 Transition of Care week 3/ day # 13  Visit Note  12/23/2023  Name: [name] MRN: [id number]          DOB: May 10, 1941  Situation: Patient enrolled in [facility] 30-day program. Visit completed with patient by telephone.   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   ablated   Anxiety    Aortic aneurysm (HCC) 09/02/2017   BENIGN PROSTATIC HYPERTROPHY 11/23/2009   Cardiomyopathy (HCC) 08/28/2016   Chronic systolic CHF (congestive heart failure) (HCC) 09/02/2017   COPD (chronic obstructive pulmonary disease) (HCC)    CORONARY ARTERY DISEASE 11/23/2009   DECREASED HEARING, 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   DEGENERATIVE JOINT DISEASE 11/23/2009   DEPRESSION 11/23/2009   FATIGUE 11/23/2009   GAIT DISTURBANCE 12/10/2009   HEMOPTYSIS UNSPECIFIED 05/07/2010   High cholesterol    HYPERTENSION 07/30/2009   HYPOTHYROIDISM 07/30/2009   Ischemic cardiomyopathy 09/02/2017   LUMBAR RADICULOPATHY, 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  Getting answers on re-starting these medications that were stopped when I was sent home from the hospital has been the most difficult thing for me.  I hope it is all straightened out today and  I have contacted Dr. Fulton Job about whether or not to re-start the plavix . Still having my regular shortness of breath, that does not and will not change, but it is stable; going to see pulmonary doctor next week.  My blood sugars have been running high, but since they have finally called in the jardiance, maybe they will start coming back down;"    Denies clinical concerns and sounds to be in no distress throughout [facility] 30-day program outreach call today  Patient Reported Symptoms: Cognitive Cognitive Status: Alert and oriented to person, place, and time, Insightful and able to interpret abstract concepts, Normal speech and language skills Cognitive/Intellectual Conditions Management [RPT]: None reported or documented in medical history or problem list   Health Maintenance Behaviors: Annual physical exam, Healthy diet, Sleep adequate, Hobbies Health Facilitated by: Healthy diet, Rest  Neurological Neurological Review of Symptoms: No symptoms reported    HEENT HEENT Symptoms Reported: No symptoms reported      Cardiovascular Cardiovascular Symptoms Reported: Swelling in legs or feet ("slight swelling in my (L) foot where the 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 not take years ago- that is my normal since then") Does patient have uncontrolled Hypertension?: No Cardiovascular Conditions: Heart failure, Hypertension, Dysrhythmia Cardiovascular Management Strategies: Medication therapy, Routine screening, Weight management, Medical device, Coping strategies Do You Have a Working Readable Scale?: Yes Weight: 191 lb (86.6 kg) (home reported value from 12/22/23)  Respiratory Respiratory Symptoms Reported: No symptoms reported Other Respiratory Symptoms: Denies shortness of breath outside of baseline; reports continues using  home O2 as prescribed 2-3 L/min continuously, sounds to be in no respiratory distress throughout TOC call Respiratory Conditions: COPD, Shortness of breath  Endocrine Patient reports the following symptoms  related to hypoglycemia or hyperglycemia : No symptoms reported Is patient diabetic?: Yes Is patient checking blood sugars at home?: Yes Endocrine Conditions: Diabetes, Thyroid  disorder Endocrine Management Strategies: Adequate rest, Coping strategies, Diet modification, Routine screening, Medication therapy, Medical device  Gastrointestinal Gastrointestinal Symptoms Reported: No symptoms reported      Genitourinary Genitourinary Symptoms Reported: No symptoms reported    Integumentary Integumentary Symptoms Reported: No symptoms reported    Musculoskeletal Musculoskelatal Symptoms Reviewed: No symptoms reported Additional Musculoskeletal Details: Confirmed continues using walker "all the time" Musculoskeletal Conditions: Mobility limited, Unsteady gait Other Musculoskeletal Conditions: (L) LE PVD Musculoskeletal Management Strategies: Coping strategies, Diet modification, Medication therapy, Routine screening      Psychosocial Psychosocial Symptoms Reported: No symptoms reported         Vitals:   12/23/23 1031  BP: (!) 152/78    Medications Reviewed Today     Reviewed by Alisen Marsiglia M, RN (Registered Nurse) on 12/23/23 at 1000  Med List Status: <None>   Medication Order Taking? Sig Documenting Provider Last Dose Status Informant  acetaminophen  (TYLENOL ) 500 MG tablet [id number] No Take 1,000 mg by mouth every 6 (six) hours as needed for mild pain. [provider] Taking Active Multiple Informants, Self, Pharmacy Records, Other  albuterol  (PROVENTIL ) (2.5 MG/3ML) 0.083% nebulizer solution [id number] No Take 3 mLs (2.5 mg total) by nebulization every 6 (six) hours as needed for wheezing or shortness of breath. Parrett, Macdonald Savoy, NP Taking Active Multiple Informants, Self, Pharmacy Records, Other           Med Note (Jamile Sivils M   Thu Dec 10, 2023 11:03 AM) 12/10/23: Reports during TOC call, has not needed recently- reports uses rescue inhaler and "gets on top of it"  quickly   albuterol  (VENTOLIN  HFA) 108 (90 Base) MCG/ACT inhaler [id number] No INHALE 2 PUFFS INTO THE LUNGS EVERY 6 HOURS AS NEEDED FOR WHEEZING OR SHORTNESS OF BREATH Parrett, Tammy S, NP Taking Active Self, Pharmacy Records, Multiple Informants, Other  atorvastatin  (LIPITOR ) 40 MG tablet [id number] No Take 1 tablet (40 mg total) by mouth daily. Elmyra Haggard, MD Taking Active Multiple Informants, Self, Pharmacy Records, Other  Carboxymethylcellul-Glycerin (LUBRICATING EYE DROPS OP) [id number] No Place 1 drop into both eyes 4 (four) times daily as needed (dry eyes).  [provider] Taking Active Multiple Informants, Self, Pharmacy Records, Other  carvedilol  (COREG ) 3.125 MG tablet [id number] No TAKE 1 TABLET(3.125 MG) BY MOUTH TWICE DAILY WITH A MEAL  Patient taking differently: Take 3.125 mg by mouth 2 (two) times daily with a meal.   Adelia Homestead, MD Taking Active Self, Pharmacy Records, Multiple Informants, Other  Cholecalciferol  (VITAMIN D3) 2000 units capsule [id number] No Take 2,000 Units by mouth daily.  [provider] Taking Active Multiple Informants, Self, Pharmacy Records, Other  cyanocobalamin  (VITAMIN B12) 500 MCG tablet [id number] No Take 500 mcg by mouth daily. [provider] Taking Active Self, Pharmacy Records, Multiple Informants, Other  empagliflozin  (JARDIANCE ) 10 MG TABS tablet [id number]  Take 1 tablet (10 mg total) by mouth daily before breakfast. Lenise Quince, MD  Active   ferrous sulfate  324 (65 Fe) MG TBEC [id number] No Take 324 mg by mouth daily. [provider] Taking Active Self, Pharmacy Records, Multiple Informants, Other  fluticasone  (FLONASE ) 50 MCG/ACT nasal spray [id number]  No Place 1 spray into both nostrils daily as needed for allergies. Audria Leather, MD Taking Active   guaifenesin  (HUMIBID E) 400 MG TABS tablet [id number] No Take 400 mg by mouth in the morning and at bedtime. [provider] Taking Active Self,  Pharmacy Records, Multiple Informants, Other  imiquimod (ALDARA) 5 % cream [id number] No Apply 1 Application topically 3 (three) times a week. APPLY TO WARTS 3-5 TIMES PER WEEK FOR 6 WEEKS OR UNTIL WARTS DISAPPEAR [provider] Taking Active Self, Pharmacy Records, Multiple Informants, Other  insulin  aspart (NOVOLOG ) 100 UNIT/ML injection [id number] No Inject 0-12 Units into the skin 3 (three) times daily before meals. [provider] Taking Active Self, Pharmacy Records, Multiple Informants, Other  insulin  glargine (LANTUS ) 100 UNIT/ML injection [id number] No Inject 20 Units into the skin daily. [provider] Taking Active Multiple Informants, Self, Pharmacy Records, Other  levothyroxine  (SYNTHROID ) 125 MCG tablet [id number] No Take 125 mcg by mouth daily before breakfast. [provider] Taking Active Self, Pharmacy Records, Multiple Informants, Other  losartan  (COZAAR ) 100 MG tablet [id number]  Take 1 tablet (100 mg total) by mouth daily. Lenise Quince, MD  Active   Multiple Vitamins-Minerals (MEGA MULTIVITAMIN FOR MEN PO) [id number] No Take 1 tablet by mouth daily. [provider] Taking Active Multiple Informants, Self, Pharmacy Records, Other  Olodaterol HCl 2.5 MCG/ACT AERS [id number] No Inhale 2 puffs into the lungs daily at 12 noon. [provider] Taking Active Self, Pharmacy Records, Multiple Informants, Other  OXYGEN  [id number] No Inhale 2 L into the lungs continuous. [provider] Taking Active Multiple Informants, Self, Pharmacy Records, Other  predniSONE  (DELTASONE ) 10 MG tablet [id number] No Take 1 tablet (10 mg total) by mouth daily with breakfast. Cameron Cea, MD Taking Active Self, Pharmacy Records, Multiple Informants, Other           Med Note Baltazar Leventhal, Alethia Huxley   Fri Dec 04, 2023 10:28 AM) Dose was recently changed from 15 mg to 10 mg  Recent Dispenses 11/30/2023 10 MG TABS (disp 120, 60d supply) 10/30/2023 5 MG TABS  (disp 180, 90d supply) 10/22/2023 20 MG TABS (disp 90, 90d supply) 09/22/2023 5 MG TABS (disp 90, 90d supply) 08/04/2023 20 MG TABS (disp 90, 90d supply)   pregabalin  (LYRICA ) 100 MG capsule [id number] No Take 100 mg by mouth daily. [provider] Taking Active Self, Pharmacy Records, Multiple Informants, Other  Semaglutide  (OZEMPIC , 1 MG/DOSE, [place]) [id number] No Inject 1 mg into the skin every Friday. [provider] Taking Active Multiple Informants, Self, Pharmacy Records, Other  sertraline  (ZOLOFT ) 100 MG tablet [id number] No Take 50 mg by mouth daily. [provider] Taking Active Self, Pharmacy Records, Multiple Informants, Other  tamsulosin  (FLOMAX ) 0.4 MG CAPS capsule [id number] No Take 1 capsule (0.4 mg total) by mouth at bedtime. Jobe Mulder, DO Taking Active Multiple Informants, Self, Pharmacy Records, Other  Tiotropium Bromide -Olodaterol (STIOLTO RESPIMAT ) 2.5-2.5 MCG/ACT AERS [id number] No Inhale 2 puffs into the lungs daily. Parrett, Macdonald Savoy, NP Taking Active Multiple Informants, Self, Pharmacy Records, Other           Med Note (Kenn Rekowski M   Thu Dec 10, 2023 11:16 AM) 12/10/23: Reports during TOC call he is no longer taking- reports "using another inhaler the pulmonary doctor gave me for maintenance inhaler"  traZODone  (DESYREL ) 50 MG tablet [id number] No Take 50 mg by mouth at bedtime. [provider] Taking Active Self, Pharmacy Records, Multiple Informants, Other  Med List Note (  Horace Lye 02/16/11 1841): Patient uses [facility] in [place] (med order)           Recommendation:   Specialty provider follow-up pulmonary provider 12/29/23- as scheduled  Follow Up Plan:   Telephone follow-up in 1 week- as scheduled 12/30/23  Plan for next week's call: Review ongoing medication adherence- follow up from [facility]- Dr. Fulton Job- re: re-starting Plavix  (held at time of hospital discharge on 12/08/23) Review daily weights/ blood  sugar/ blood pressure trends since Wednesday 12/23/23 Reinforce education re: self health management of COPD/ CHF/ DM: daily weights, blood sugars; use of home O2, rescue inhaler Review provider appointments- 12/28/23 pulmonary provider Confirm no new falls/ using assistive devices  Pls call/ message for questions,  Deasia Chiu Mckinney Juel Bellerose, RN, BSN, CCRN Alumnus RN Care Manager  Transitions of Care  [facility] [phone]: direct office

## 2023-12-24 LAB — CUP PACEART REMOTE DEVICE CHECK
Battery Remaining Longevity: 98 mo
Battery Voltage: 2.99 V
Brady Statistic AP VP Percent: 56.31 %
Brady Statistic AP VS Percent: 0.02 %
Brady Statistic AS VP Percent: 42.12 %
Brady Statistic AS VS Percent: 1.55 %
Brady Statistic RA Percent Paced: 57.21 %
Brady Statistic RV Percent Paced: 98.43 %
Date Time Interrogation Session: 20250529221445
Implantable Lead Connection Status: 753985
Implantable Lead Connection Status: 753985
Implantable Lead Implant Date: 20201204
Implantable Lead Implant Date: 20201204
Implantable Lead Location: 753859
Implantable Lead Location: 753860
Implantable Lead Model: 5076
Implantable Lead Model: 5076
Implantable Pulse Generator Implant Date: 20201204
Lead Channel Impedance Value: 304 Ohm
Lead Channel Impedance Value: 342 Ohm
Lead Channel Impedance Value: 380 Ohm
Lead Channel Impedance Value: 399 Ohm
Lead Channel Pacing Threshold Amplitude: 0.5 V
Lead Channel Pacing Threshold Amplitude: 0.5 V
Lead Channel Pacing Threshold Pulse Width: 0.4 ms
Lead Channel Pacing Threshold Pulse Width: 0.4 ms
Lead Channel Sensing Intrinsic Amplitude: 1.625 mV
Lead Channel Sensing Intrinsic Amplitude: 1.625 mV
Lead Channel Sensing Intrinsic Amplitude: 17.875 mV
Lead Channel Sensing Intrinsic Amplitude: 17.875 mV
Lead Channel Setting Pacing Amplitude: 1.5 V
Lead Channel Setting Pacing Amplitude: 2.5 V
Lead Channel Setting Pacing Pulse Width: 0.4 ms
Lead Channel Setting Sensing Sensitivity: 2 mV
Zone Setting Status: 755011
Zone Setting Status: 755011

## 2023-12-25 ENCOUNTER — Ambulatory Visit (INDEPENDENT_AMBULATORY_CARE_PROVIDER_SITE_OTHER): Payer: Medicare PPO

## 2023-12-25 DIAGNOSIS — I442 Atrioventricular block, complete: Secondary | ICD-10-CM | POA: Diagnosis not present

## 2023-12-29 ENCOUNTER — Encounter: Payer: Self-pay | Admitting: Adult Health

## 2023-12-29 ENCOUNTER — Ambulatory Visit: Admitting: Adult Health

## 2023-12-29 ENCOUNTER — Ambulatory Visit (INDEPENDENT_AMBULATORY_CARE_PROVIDER_SITE_OTHER)

## 2023-12-29 ENCOUNTER — Ambulatory Visit

## 2023-12-29 VITALS — BP 146/66 | HR 66 | Ht 74.0 in | Wt 193.0 lb

## 2023-12-29 DIAGNOSIS — J9611 Chronic respiratory failure with hypoxia: Secondary | ICD-10-CM

## 2023-12-29 DIAGNOSIS — J449 Chronic obstructive pulmonary disease, unspecified: Secondary | ICD-10-CM | POA: Diagnosis not present

## 2023-12-29 DIAGNOSIS — Z87891 Personal history of nicotine dependence: Secondary | ICD-10-CM

## 2023-12-29 DIAGNOSIS — J432 Centrilobular emphysema: Secondary | ICD-10-CM

## 2023-12-29 DIAGNOSIS — S271XXD Traumatic hemothorax, subsequent encounter: Secondary | ICD-10-CM

## 2023-12-29 DIAGNOSIS — G4733 Obstructive sleep apnea (adult) (pediatric): Secondary | ICD-10-CM | POA: Diagnosis not present

## 2023-12-29 DIAGNOSIS — J942 Hemothorax: Secondary | ICD-10-CM | POA: Diagnosis not present

## 2023-12-29 DIAGNOSIS — I5022 Chronic systolic (congestive) heart failure: Secondary | ICD-10-CM

## 2023-12-29 MED ORDER — ALBUTEROL SULFATE (2.5 MG/3ML) 0.083% IN NEBU: 2.5000 mg | INHALATION_SOLUTION | Freq: Four times a day (QID) | RESPIRATORY_TRACT | 12 refills | Status: DC | PRN

## 2023-12-29 MED ORDER — FUROSEMIDE 20 MG PO TABS: 20.0000 mg | ORAL_TABLET | Freq: Every day | ORAL | 2 refills | Status: DC | PRN

## 2023-12-29 NOTE — Assessment & Plan Note (Signed)
 Severe COPD with emphysema.  Continue on current regimen.  Patient continues to have significant symptom burden.

## 2023-12-29 NOTE — Progress Notes (Signed)
 @Patient  ID: [name], male    DOB: 10-Feb-1941, 83 y.o.   MRN: [id number]  Chief Complaint  Patient presents with   Follow-up    Referring provider: Audria Leather, MD  HPI: 83 yo male former smoker followed for very severe COPD, chronic hypoxic respiratory failure on Oxygen , OSA on CPAP. History of lung nodule followed on serial imaging.  Medical history significant for rheumatoid arthritis, diabetes, congestive heart failure, coronary artery disease, chronic kidney disease, atrial fibrillation with previous use of amiodarone -stopped in August 2019.  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 PFT 10/2015 FEV1 was 36%, ratio 46, FVC 58%, DLCO 26%    CT chest 12/2016 -masslike consolidation in LUL   PET scan 03/20/17 >+metabolic act in LUL consolidation    CT chest 08/2017 >> 2.4 x 3.5 cm triangular subpleural opacity in the anterior left upper lobe > has resolved the platelike scarring bilateral nodular infiltrates stable,   CT chest 09/2020 severe emphysema, extensive pleural calcification.  Previous nodule in the right lower lobe has significantly decreased in size measuring 0.8 x 0.8 cm previously at 2.3 cm.  A stable left upper lobe cavitary nodule  measuring 4 mm.  And a stable irregular nodule in the left lower lobe measuring 1.5 cm, and a left lower lobe nodule measuring 1.5 cm   CT chest October 22, 2021 that showed a stable uncomplicated aneurysmal dilatation along the aortic arch.  Interval increase of the abdominal aorta from 33 mm to 36 mm.,  Advanced emphysema.  Stable chronic extensive pleural calcifications, no worrisome pulmonary nodules.  Ill-defined left lower lobe pulmonary nodules unchanged since October 2020. Extensive cholelithiasis   CT chest 09/2022 Grossly stable 4.2 cm focal aneurysm seen involving aortic arch. Stable calcified pleural plaques and associated scarring are noted in right lung.Minimally displaced fractures are seen involving the left fourth, fifth, sixth and seventh ribs.  12/29/2023 Follow up: COPD, O2 RF, Post hospital follow up -Left Hemothorax Patient presents for a posthospital follow-up.  Patient has severe COPD with emphysema on home oxygen .  Patient was seen last month in the office for an acute office visit with hemoptysis.  Prior to office visit he had had a fall onto his left side due to chronic left leg weakness and neuropathy.  Over the few weeks following his fall patient had progressive shortness of breath and developed hemoptysis.  Patient had his annual CT angio chest on November 17, 2023 that showed interval development of a focal pleural thickening in the left superior hemothorax measuring 6.7 x 4.3 cm.  Chest x-ray last month showed new left apical opacification.  Patient was admitted to the hospital.  CT chest on admission showed significant increase in the soft tissue density in the  left pleural margin measuring up to 10.3 cm.  Patient was felt to have a left traumatic hemothorax.  Chest tube was placed by interventional radiology.  Patient had significant clinical improvement and decrease in left apical collection.  Chest tube was removed and patient was discharged home.  Plavix  has been held.  Since  discharge patient says he is feeling better.  He remains very weak.  Continues to be cautious to prevent recurrent falls as he has left leg weakness and neuropathy.  Says he gets short of breath with minimal activities.  Does feel that his breathing has been slightly worse than his baseline.  We discussed home health or physical therapy but patient declines at this time.  His daughter checks on him frequently   No Known Allergies  Immunization History  Administered Date(s) Administered   Fluad Quad(high Dose 65+) 04/26/2019, 06/08/2020, 05/01/2021   Fluad Trivalent(High Dose 65+) 04/23/2023   Influenza Split 04/14/2011, 04/27/2013, 03/28/2014   Influenza Whole 05/28/2009, 04/27/2010, 03/31/2012   Influenza, High Dose Seasonal PF 04/10/2010, 04/11/2011, 04/19/2013, 03/21/2015, 04/05/2016, 05/16/2017, 03/28/2018, 04/08/2018   Influenza,inj,Quad PF,6+ Mos 03/17/2014, 04/04/2016   Influenza-Unspecified 04/30/2006, 04/28/2007, 04/28/2008, 05/29/2009, 04/10/2010, 05/26/2011, 05/07/2012, 05/28/2020, 05/01/2021, 04/27/2022   PFIZER Comirnaty(Gray Top)Covid-19 Tri-Sucrose Vaccine 11/29/2020, 05/01/2021   PFIZER(Purple Top)SARS-COV-2 Vaccination 08/19/2019, 09/09/2019, 03/15/2020, 04/08/2020, 12/09/2020   PNEUMOCOCCAL CONJUGATE-20 06/27/2022   Pfizer Covid-19 Vaccine Bivalent Booster 23yrs & up 05/01/2021   Pfizer(Comirnaty)Fall Seasonal Vaccine 12 years and older 05/03/2022, 04/23/2023   Pneumococcal Conjugate-13 09/20/2013, 03/29/2015   Pneumococcal Polysaccharide-23 05/28/2008, 04/25/2010, 04/19/2013   Pneumococcal-Unspecified 06/26/2006, 04/27/2013   Rsv, Bivalent, Protein Subunit Rsvpref,pf Pattricia Bores) 07/30/2022   Td 11/23/2009   Tdap 07/17/2011, 07/30/2022   Zoster, Live 04/28/2009, 11/08/2009    Past Medical History:  Diagnosis Date   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   ablated   Anxiety    Aortic aneurysm (HCC) 09/02/2017   BENIGN PROSTATIC HYPERTROPHY 11/23/2009   Cardiomyopathy (HCC)  08/28/2016   Chronic systolic CHF (congestive heart failure) (HCC) 09/02/2017   COPD (chronic obstructive pulmonary disease) (HCC)    CORONARY ARTERY DISEASE 11/23/2009   DECREASED HEARING, 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   DEGENERATIVE JOINT DISEASE 11/23/2009   DEPRESSION 11/23/2009   FATIGUE 11/23/2009   GAIT DISTURBANCE 12/10/2009   HEMOPTYSIS UNSPECIFIED 05/07/2010   High cholesterol    HYPERTENSION 07/30/2009   HYPOTHYROIDISM 07/30/2009   Ischemic cardiomyopathy 09/02/2017   LUMBAR RADICULOPATHY, 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   On home oxygen  therapy    "2-3L; 24/7" (09/10/2016)   OSA on CPAP    Pneumonia    PTSD (post-traumatic stress disorder) 03/10/2012   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   RA (rheumatoid arthritis) (HCC) 06/11/2011   "qwhere" (09/10/2016)   RESPIRATORY FAILURE, CHRONIC 07/31/2009   Scleritis of both eyes 03/17/2014   Thrombocytopenia (HCC)    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   Type II diabetes mellitus (HCC)     Tobacco History: Social History   Tobacco Use  Smoking Status Former   Current packs/day: 0.00   Average packs/day: 2.5 packs/day for 40.0 years (100.0 ttl pk-yrs)   Types: Cigarettes, Pipe, Cigars   Start date: 07/28/1958   Quit date: 07/28/1998   Years since quitting: 25.4   Passive exposure: Never  Smokeless Tobacco Never   Counseling given: No   Outpatient Medications Prior to Visit  Medication Sig Dispense Refill   acetaminophen  (TYLENOL ) 500 MG tablet Take 1,000 mg by mouth every 6 (six) hours as needed for mild pain.     albuterol  (VENTOLIN  HFA) 108 (90 Base)  MCG/ACT inhaler INHALE 2 PUFFS INTO THE LUNGS EVERY 6 HOURS AS NEEDED FOR WHEEZING OR SHORTNESS OF BREATH 54 g 3   atorvastatin  (LIPITOR ) 40 MG tablet Take 1 tablet (40 mg total) by mouth daily. 90 tablet 3   Carboxymethylcellul-Glycerin (LUBRICATING EYE DROPS OP) Place 1 drop into both eyes 4 (four) times daily as needed (dry eyes).      carvedilol  (COREG ) 3.125 MG tablet TAKE 1 TABLET(3.125 MG) BY MOUTH TWICE DAILY WITH A MEAL (Patient  taking differently: Take 3.125 mg by mouth 2 (two) times daily with a meal.) 60 tablet 3   Cholecalciferol  (VITAMIN D3) 2000 units capsule Take 2,000 Units by mouth daily.      cyanocobalamin  (VITAMIN B12) 500 MCG tablet Take 500 mcg by mouth daily.     empagliflozin  (JARDIANCE ) 10 MG TABS tablet Take 1 tablet (10 mg total) by mouth daily before breakfast. 30 tablet 3   ferrous sulfate  324 (65 Fe) MG TBEC Take 324 mg by mouth daily.     fluticasone  (FLONASE ) 50 MCG/ACT nasal spray Place 1 spray into both nostrils daily as needed for allergies.     guaifenesin  (HUMIBID E) 400 MG TABS tablet Take 400 mg by mouth in the morning and at bedtime.     imiquimod (ALDARA) 5 % cream Apply 1 Application topically 3 (three) times a week. APPLY TO WARTS 3-5 TIMES PER WEEK FOR 6 WEEKS OR UNTIL WARTS DISAPPEAR     insulin  aspart (NOVOLOG ) 100 UNIT/ML injection Inject 0-12 Units into the skin 3 (three) times daily before meals.     insulin  glargine (LANTUS ) 100 UNIT/ML injection Inject 20 Units into the skin daily.     levothyroxine  (SYNTHROID ) 125 MCG tablet Take 125 mcg by mouth daily before breakfast.     losartan  (COZAAR ) 100 MG tablet Take 1 tablet (100 mg total) by mouth daily. 90 tablet 0   Multiple Vitamins-Minerals (MEGA MULTIVITAMIN FOR MEN PO) Take 1 tablet by mouth daily.     Olodaterol HCl 2.5 MCG/ACT AERS Inhale 2 puffs into the lungs daily at 12 noon.     OXYGEN  Inhale 2 L into the lungs continuous.     predniSONE  (DELTASONE ) 10 MG tablet Take 1 tablet (10 mg total) by mouth daily with breakfast. 90 tablet 3   pregabalin  (LYRICA ) 100 MG capsule Take 100 mg by mouth daily.     Semaglutide  (OZEMPIC , 1 MG/DOSE, [place]) Inject 1 mg into the skin every Friday.     sertraline  (ZOLOFT ) 100 MG tablet Take 50 mg by mouth daily.     tamsulosin  (FLOMAX ) 0.4 MG CAPS capsule Take 1 capsule (0.4 mg total) by mouth at bedtime. 30 capsule 0   Tiotropium Bromide -Olodaterol (STIOLTO RESPIMAT ) 2.5-2.5 MCG/ACT AERS  Inhale 2 puffs into the lungs daily. 4 g 0   traZODone  (DESYREL ) 50 MG tablet Take 50 mg by mouth at bedtime.     albuterol  (PROVENTIL ) (2.5 MG/3ML) 0.083% nebulizer solution Take 3 mLs (2.5 mg total) by nebulization every 6 (six) hours as needed for wheezing or shortness of breath. 120 mL 12   No facility-administered medications prior to visit.     Review of Systems:   Constitutional:   No  weight loss, night sweats,  Fevers, chills, +fatigue, or  lassitude.  HEENT:   No headaches,  Difficulty swallowing,  Tooth/dental problems, or  Sore throat,                No sneezing, itching, ear ache, nasal congestion,  post nasal drip,   CV:  No chest pain,  Orthopnea, PND, swelling in lower extremities, anasarca, dizziness, palpitations, syncope.   GI  No heartburn, indigestion, abdominal pain, nausea, vomiting, diarrhea, change in bowel habits, loss of appetite, bloody stools.   Resp:  No chest wall deformity  Skin: no rash or lesions.  GU: no dysuria, change in color of urine, no urgency or frequency.  No flank pain, no hematuria   MS:  No joint pain or swelling.  No decreased range of motion.  No back pain.    Physical Exam  BP (!) 146/66 (BP Location: Right Arm, Cuff Size: Normal)   Pulse 66   Ht 6\' 2"  (1.88 m)   Wt 193 lb (87.5 kg)   SpO2 95%   BMI 24.78 kg/m   GEN: A/Ox3; pleasant , NAD, chronic ill-appearing, wheelchair, oxygen    HEENT:  /AT,  NOSE-clear, THROAT-clear, no lesions, no postnasal drip or exudate noted.   NECK:  Supple w/ fair ROM; no JVD; normal carotid impulses w/o bruits; no thyromegaly or nodules palpated; no lymphadenopathy.    RESP coarse breath sounds bilaterally  no accessory muscle use, no dullness to percussion  CARD:  RRR, no m/r/g, 1+ peripheral edema, pulses intact, no cyanosis or clubbing.  GI:   Soft & nt; nml bowel sounds; no organomegaly or masses detected.   Musco: Warm bil, no deformities or joint swelling noted.   Neuro: alert,  no focal deficits noted.    Skin: Warm, no lesions or rashes    Lab Results:  CBC    Component Value Date/Time   WBC 9.7 12/15/2023 1455   RBC 4.12 (L) 12/15/2023 1455   HGB 11.6 (L) 12/15/2023 1455   HGB 12.4 (L) 12/01/2023 1337   HGB 12.0 (L) 06/28/2019 1453   HCT 35.3 (L) 12/15/2023 1455   HCT 38.4 06/28/2019 1453   PLT 177.0 12/15/2023 1455   PLT 150 12/01/2023 1337   PLT 44 (LL) 06/28/2019 1453   MCV 85.7 12/15/2023 1455   MCV 89 06/28/2019 1453   MCH 27.9 12/09/2023 0254   MCHC 33.0 12/15/2023 1455   RDW 15.4 12/15/2023 1455   RDW 13.8 06/28/2019 1453   LYMPHSABS 1.1 12/15/2023 1455   LYMPHSABS 1.2 12/23/2016 1053   MONOABS 0.6 12/15/2023 1455   EOSABS 0.0 12/15/2023 1455   EOSABS 0.3 12/23/2016 1053   BASOSABS 0.1 12/15/2023 1455   BASOSABS 0.0 12/23/2016 1053    BMET    Component Value Date/Time   NA 137 12/15/2023 1455   NA 140 01/02/2023 0837   K 5.0 12/15/2023 1455   CL 98 12/15/2023 1455   CO2 26 12/15/2023 1455   GLUCOSE 219 (H) 12/15/2023 1455   BUN 33 (H) 12/15/2023 1455   BUN 27 01/02/2023 0837   CREATININE 1.33 12/15/2023 1455   CREATININE 1.17 04/13/2020 0915   CALCIUM  8.9 12/15/2023 1455   GFRNONAA 41 (L) 12/09/2023 0254   GFRNONAA >60 07/15/2018 1059   GFRAA 58 (L) 02/22/2020 1029   GFRAA >60 07/15/2018 1059    BNP    Component Value Date/Time   BNP 68.4 12/03/2023 1607    ProBNP    Component Value Date/Time   PROBNP 188 10/20/2016 1038   PROBNP 59.9 05/04/2011 0517      Administration History     None          Latest Ref Rng & Units 11/16/2015    9:55 AM 12/30/2013    9:45 AM  PFT Results  FVC-Pre L 2.96  3.01   FVC-Predicted Pre % 59  59   FVC-Post L 2.89  3.01   FVC-Predicted Post % 58  59   Pre FEV1/FVC % % 43  45   Post FEV1/FCV % % 46  49   FEV1-Pre L 1.28  1.36   FEV1-Predicted Pre % 35  36   FEV1-Post L 1.33  1.48   DLCO uncorrected ml/min/mmHg 9.88  10.13   DLCO UNC% % 26  26   DLCO corrected  ml/min/mmHg 9.94    DLCO COR %Predicted % 26    DLVA Predicted % 40  39   TLC L  6.31   TLC % Predicted %  80   RV % Predicted %  80     No results found for: "NITRICOXIDE"      Assessment & Plan:   Hemothorax, traumatic, subsequent encounter Traumatic left hemothorax-recent hospitalization with clinical improvement after chest tube drainage.  Check chest x-ray today.  COPD (chronic obstructive pulmonary disease) (HCC) Severe COPD with emphysema.  Continue on current regimen.  Patient continues to have significant symptom burden.  Chronic respiratory failure with hypoxia (HCC) Continue on oxygen  to maintain O2 saturations greater than 88 to 90%.  OSA on CPAP Continue on CPAP therapy.  Chronic systolic CHF (congestive heart failure) (HCC) Continue follow-up with cardiology.  Lasix  20 mg daily as needed refill given.  Patient advised to use with caution as he has chronic kidney disease.  Plan  Patient Instructions  Chest xray today.  Continue on Stiolto 2 puffs daily .  Albuterol  inhaler /nebulizer As needed   Mucinex  DM Twice daily  As needed  Cough/congestion .  Continue on Oxygen  2l/m rest and 3l/m with activity .  Allegra daily As needed   Flonase  daily As needed   Continue on CPAP At bedtime wear with Naps.  Activity as tolerated.  Saline nasal rinses Twice daily   Saline nasal gel At bedtime .  Follow up with cardiology.  Follow up with vascular surgeon.  Lasix  20mg  daily As needed  weight gain of 3 lbs or more /edema . (Eat banana when taking)  Daily weights  Follow up in 2 Months  with Dr. Villa Greaser or Gatsby Chismar NP and As needed   Please contact office for sooner follow up if symptoms do not improve or worsen or seek emergency care     I spent  42 minutes dedicated to the care of this patient on the date of this encounter to include pre-visit review of records, face-to-face time with the patient discussing conditions above, post visit ordering of testing, clinical  documentation with the electronic health record, making appropriate referrals as documented, and communicating necessary findings to members of the patients care team.   Roena Clark, NP 12/29/2023

## 2023-12-29 NOTE — Assessment & Plan Note (Signed)
Continue on oxygen to maintain O2 saturations greater than 88 to 90%. 

## 2023-12-29 NOTE — Assessment & Plan Note (Signed)
 Continue on CPAP therapy.

## 2023-12-29 NOTE — Patient Instructions (Addendum)
 Chest xray today.  Continue on Stiolto 2 puffs daily .  Albuterol  inhaler /nebulizer As needed   Mucinex  DM Twice daily  As needed  Cough/congestion .  Continue on Oxygen  2l/m rest and 3l/m with activity .  Allegra daily As needed   Flonase  daily As needed   Continue on CPAP At bedtime wear with Naps.  Activity as tolerated.  Saline nasal rinses Twice daily   Saline nasal gel At bedtime .  Follow up with cardiology.  Follow up with vascular surgeon.  Lasix  20mg  daily As needed  weight gain of 3 lbs or more /edema . (Eat banana when taking)  Daily weights  Follow up in 2 Months  with Dr. Villa Greaser or Yoali Conry NP and As needed   Please contact office for sooner follow up if symptoms do not improve or worsen or seek emergency care

## 2023-12-29 NOTE — Assessment & Plan Note (Signed)
 Traumatic left hemothorax-recent hospitalization with clinical improvement after chest tube drainage.  Check chest x-ray today.

## 2023-12-29 NOTE — Assessment & Plan Note (Signed)
 Continue follow-up with cardiology.  Lasix  20 mg daily as needed refill given.  Patient advised to use with caution as he has chronic kidney disease.

## 2023-12-30 ENCOUNTER — Other Ambulatory Visit: Payer: Self-pay | Admitting: *Deleted

## 2023-12-30 NOTE — Transitions of Care (Post Inpatient/ED Visit) (Signed)
 Transition of Care week 4/ day # 20  Visit Note  12/30/2023  Name: [name] MRN: [id number]          DOB: 08-Mar-1941  Situation: Patient enrolled in [facility] 30-day program. Visit completed with patient by telephone.   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   ablated   Anxiety    Aortic aneurysm (HCC) 09/02/2017   BENIGN PROSTATIC HYPERTROPHY 11/23/2009   Cardiomyopathy (HCC) 08/28/2016   Chronic systolic CHF (congestive heart failure) (HCC) 09/02/2017   COPD (chronic obstructive pulmonary disease) (HCC)    CORONARY ARTERY DISEASE 11/23/2009   DECREASED HEARING, 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   DEGENERATIVE JOINT DISEASE 11/23/2009   DEPRESSION 11/23/2009   FATIGUE 11/23/2009   GAIT DISTURBANCE 12/10/2009   HEMOPTYSIS UNSPECIFIED 05/07/2010   High cholesterol    HYPERTENSION 07/30/2009   HYPOTHYROIDISM 07/30/2009   Ischemic cardiomyopathy 09/02/2017   LUMBAR RADICULOPATHY, 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  I did have the visit with pulmonary doctor Tammy yesterday- she did a chest XR and it came back that I may have some pneumonia-- so I am waiting to hear back from her with instructions; I  feel like my breathing is where I always am- using my oxygen , nebulizer and inhaler; not any more short of breath than I usually am.  And the vascular doctor told me to start taking ASA 81 mg until the cardiologist decides whether or not to re-start the Plavix , so I have started that.  I only use the fluid pill as needed, and my weights are stable and in my normal range, so I only use it occasionally"    Denies clinical concerns and sounds to be in no distress throughout [facility] 30-day program outreach call today  Patient Reported Symptoms: Cognitive Cognitive Status: Alert and oriented to person, place, and time, Insightful and able to interpret abstract concepts, Normal speech and language skills Cognitive/Intellectual Conditions Management [RPT]: None reported or documented in medical history or problem list   Health Maintenance Behaviors: Annual physical exam, Healthy diet, Sleep adequate, Hobbies Health Facilitated by: Healthy diet, Rest  Neurological Neurological Review of Symptoms: No symptoms reported    HEENT HEENT Symptoms Reported: No symptoms reported      Cardiovascular Cardiovascular Symptoms Reported: Swelling in legs or feet, Other: Other Cardiovascular Symptoms: Confirmed continues taking diuretic prn as instructed; confirmed continues monitoring/ recording daily weights at home Does patient have uncontrolled Hypertension?: No Cardiovascular Conditions: Hypertension, Dysrhythmia, Heart failure Cardiovascular Management Strategies: Medication therapy, Routine screening, Diet modification, Coping strategies, Weight management Do You Have a Working Readable Scale?: Yes Weight: 191 lb (86.6 kg) (home reported value from 12/30/23: reports this as  baseline range between 188-191 lbs)  Respiratory Respiratory Symptoms Reported: No symptoms reported, Shortness of breath Other Respiratory Symptoms: Reports baseline/ "normal" shortness of breath; continues using home O2 as  prescribed Additional Respiratory Details: Reviewed pulmonary provider office visit/ CXR from 12/29/23 with patient Respiratory Conditions: COPD, Shortness of breath  Endocrine Patient reports the following symptoms related to hypoglycemia or hyperglycemia : No symptoms reported Is patient diabetic?: Yes Is patient checking blood sugars at home?: Yes Endocrine Conditions: Diabetes, Thyroid  disorder Endocrine Management Strategies: Adequate rest, Coping strategies, Routine screening, Diet modification, Medication therapy, Medical device  Gastrointestinal Gastrointestinal Symptoms Reported: No symptoms reported Additional Gastrointestinal Details: Continues to report "good appetite/ eating good;" and "normal and regular BM's"      Genitourinary Genitourinary Symptoms Reported: No symptoms reported    Integumentary Integumentary Symptoms Reported: No symptoms reported    Musculoskeletal Musculoskelatal Symptoms Reviewed: Difficulty walking, Unsteady gait Additional Musculoskeletal Details: confirmed currently requiring/ using assistive devices for ambulation - walker as per baseline Musculoskeletal Conditions: Mobility limited, Other Other Musculoskeletal Conditions: LE PVD Musculoskeletal Management Strategies: Coping strategies, Medication therapy, Routine screening, Medical device      Psychosocial Psychosocial Symptoms Reported: No symptoms reported         Vitals:   12/30/23 1031  BP: (!) 147/70    Medications Reviewed Today     Reviewed by Marijke Guadiana M, RN (Registered Nurse) on 12/30/23 at 1059  Med List Status: <None>   Medication Order Taking? Sig Documenting Provider Last Dose Status Informant  acetaminophen  (TYLENOL ) 500 MG tablet [id number]  Take 1,000 mg by mouth every 6 (six) hours as needed for mild pain. [provider]  Active Multiple Informants, Self, Pharmacy Records, Other  albuterol  (PROVENTIL ) (2.5 MG/3ML) 0.083% nebulizer solution [id number] Yes Take  3 mLs (2.5 mg total) by nebulization every 6 (six) hours as needed for wheezing or shortness of breath. Parrett, Macdonald Savoy, NP Taking Active   albuterol  (VENTOLIN  HFA) 108 (90 Base) MCG/ACT inhaler [id number] Yes INHALE 2 PUFFS INTO THE LUNGS EVERY 6 HOURS AS NEEDED FOR WHEEZING OR SHORTNESS OF BREATH Parrett, Tammy S, NP Taking Active Self, Pharmacy Records, Multiple Informants, Other  aspirin  81 MG chewable tablet [id number] Yes Chew 81 mg by mouth daily. 12/30/23: Patient reports during [facility] call: [facility] provider instructed to start taking on 12/23/23 (verified through review of EHR) in light of Plavix  continued to being held post- recent hospital discharge on 12/08/23 Adelia Homestead, MD Taking Active Self           Med Note (Alfredia Desanctis M   Wed Dec 30, 2023 10:58 AM) 12/30/23: Patient reports during [facility] call: [facility] provider instructed to start taking on 12/23/23 (verified through review of EHR) in light of Plavix  continued to being held post- recent hospital discharge on 12/08/23   atorvastatin  (LIPITOR ) 40 MG tablet [id number]  Take 1 tablet (40 mg total) by mouth daily. Elmyra Haggard, MD  Active Multiple Informants, Self, Pharmacy Records, Other  Carboxymethylcellul-Glycerin (LUBRICATING EYE DROPS OP) [id number]  Place 1 drop into both eyes 4 (four) times daily as needed (dry eyes).  [provider]  Active Multiple Informants, Self, Pharmacy Records, Other  carvedilol  (COREG ) 3.125 MG tablet [id number]  TAKE 1 TABLET(3.125 MG) BY MOUTH TWICE DAILY WITH A MEAL  Patient taking differently: Take 3.125 mg by mouth 2 (two) times daily with a meal.   Adelia Homestead, MD  Active Self, Pharmacy Records, Multiple Informants, Other  Cholecalciferol  (VITAMIN D3) 2000 units  capsule [id number]  Take 2,000 Units by mouth daily.  [provider]  Active Multiple Informants, Self, Pharmacy Records, Other  cyanocobalamin  (VITAMIN B12) 500 MCG tablet [id number]  Take 500 mcg by mouth daily. [provider]  Active Self, Pharmacy Records, Multiple Informants, Other  empagliflozin  (JARDIANCE ) 10 MG TABS tablet [id number]  Take 1 tablet (10 mg total) by mouth daily before breakfast. Lenise Quince, MD  Active   ferrous sulfate  324 (65 Fe) MG TBEC [id number]  Take 324 mg by mouth daily. [provider]  Active Self, Pharmacy Records, Multiple Informants, Other  fluticasone  (FLONASE ) 50 MCG/ACT nasal spray [id number]  Place 1 spray into both nostrils daily as needed for allergies. Audria Leather, MD  Active   furosemide  (LASIX ) 20 MG tablet [id number] Yes Take 1 tablet (20 mg total) by mouth daily as needed. Parrett, Macdonald Savoy, NP Taking Active            Med Note (Jessejames Steelman M   Wed Dec 30, 2023 10:59 AM) 12/30/23: Patient reports during [facility] call: not taking "regularly;" "only as needed" has not needed regularly per patient report   guaifenesin  (HUMIBID E) 400 MG TABS tablet [id number]  Take 400 mg by mouth in the morning and at bedtime. [provider]  Active Self, Pharmacy Records, Multiple Informants, Other  imiquimod (ALDARA) 5 % cream [id number]  Apply 1 Application topically 3 (three) times a week. APPLY TO WARTS 3-5 TIMES PER WEEK FOR 6 WEEKS OR UNTIL WARTS DISAPPEAR [provider]  Active Self, Pharmacy Records, Multiple Informants, Other  insulin  aspart (NOVOLOG ) 100 UNIT/ML injection [id number]  Inject 0-12 Units into the skin 3 (three) times daily before meals. [provider]  Active Self, Pharmacy Records, Multiple Informants, Other  insulin  glargine (LANTUS ) 100 UNIT/ML injection [id number]  Inject 20 Units into the skin daily. [provider]  Active Multiple Informants, Self, Pharmacy Records, Other  levothyroxine  (SYNTHROID ) 125 MCG tablet [id number]  Take 125 mcg by mouth daily before breakfast. [provider]  Active Self, Pharmacy Records, Multiple Informants, Other  losartan  (COZAAR ) 100 MG tablet [id number]  Take 1 tablet  (100 mg total) by mouth daily. Lenise Quince, MD  Active   Multiple Vitamins-Minerals (MEGA MULTIVITAMIN FOR MEN PO) [id number]  Take 1 tablet by mouth daily. [provider]  Active Multiple Informants, Self, Pharmacy Records, Other  Olodaterol HCl 2.5 MCG/ACT AERS [id number]  Inhale 2 puffs into the lungs daily at 12 noon. [provider]  Active Self, Pharmacy Records, Multiple Informants, Other  OXYGEN  [id number] Yes Inhale 2 L into the lungs continuous. [provider] Taking Active Multiple Informants, Self, Pharmacy Records, Other  predniSONE  (DELTASONE ) 10 MG tablet [id number]  Take 1 tablet (10 mg total) by mouth daily with breakfast. Cameron Cea, MD  Active Self, Pharmacy Records, Multiple Informants, Other           Med Note Baltazar Leventhal, Alethia Huxley   Fri Dec 04, 2023 10:28 AM) Dose was recently changed from 15 mg to 10 mg  Recent Dispenses 11/30/2023 10 MG TABS (disp 120, 60d supply) 10/30/2023 5 MG TABS (disp 180, 90d supply) 10/22/2023 20 MG TABS (disp 90, 90d supply) 09/22/2023 5 MG TABS (disp 90, 90d supply) 08/04/2023 20 MG TABS (disp 90, 90d supply)   pregabalin  (LYRICA ) 100 MG capsule [id number]  Take 100 mg by mouth daily. [provider]  Active Self, Pharmacy Records, Multiple Informants, Other  Semaglutide  (OZEMPIC , 1 MG/DOSE, [place]) [id number]  Inject  1 mg into the skin every Friday. [provider]  Active Multiple Informants, Self, Pharmacy Records, Other  sertraline  (ZOLOFT ) 100 MG tablet [id number]  Take 50 mg by mouth daily. [provider]  Active Self, Pharmacy Records, Multiple Informants, Other  tamsulosin  (FLOMAX ) 0.4 MG CAPS capsule [id number]  Take 1 capsule (0.4 mg total) by mouth at bedtime. Jobe Mulder, DO  Active Multiple Informants, Self, Pharmacy Records, Other  Tiotropium Bromide -Olodaterol (STIOLTO RESPIMAT ) 2.5-2.5 MCG/ACT AERS [id number]  Inhale 2 puffs into the lungs daily. Parrett, Macdonald Savoy, NP   Active Multiple Informants, Self, Pharmacy Records, Other           Med Note (Jamille Yoshino M   Thu Dec 10, 2023 11:16 AM) 12/10/23: Reports during TOC call he is no longer taking- reports "using another inhaler the pulmonary doctor gave me for maintenance inhaler"  traZODone  (DESYREL ) 50 MG tablet [id number]  Take 50 mg by mouth at bedtime. [provider]  Active Self, Pharmacy Records, Multiple Informants, Other  Med List Note Horace Lye 02/16/11 1841): Patient uses [facility] in [place] (med order)           Recommendation:   Continue Current Plan of Care  Follow Up Plan:   Telephone follow-up in 1 week: Tuesday 01/05/24 at 10:15 am with nurse Barbie and then again on Monday 01/11/24 at 10:00 am with nurse Miguelangel Korn  Plan for next week's call: Review ongoing medication adherence- ? Any changes to medications post- recent pulmonary provider office visit/ CXR on 12/29/23 Review daily weights/ blood sugar/ blood pressure trends since Wednesday 12/30/23 Reinforce education re: self health management of COPD/ CHF/ DM: daily weights, blood sugars; use of home O2, rescue inhaler Review any upcoming provider appointments Confirm no new falls/ using assistive devices  Pls call/ message for questions,  Erlene Hawks, RN, BSN, CCRN Alumnus RN Care Manager  Transitions of Care  [facility] - [facility] [phone]: direct office

## 2023-12-30 NOTE — Patient Instructions (Signed)
 Visit Information  Thank you for taking time to visit with me today. Please don't hesitate to contact me if I can be of assistance to you before our next scheduled telephone appointment.  Our next appointment is by telephone on Tuesday 01/05/24 at 10:15 am with nurse Barbie and then again on Monday 01/11/24 at 10:00 am with nurse Tawny Fate  Please call the care guide team at [phone] if you need to cancel or reschedule your appointment.   Following are the goals we discussed today:  Patient Self Care Activities:  Attend all scheduled provider appointments Call provider office for new concerns or questions  Participate in Transition of Care Program/Attend TOC scheduled calls Take medications as prescribed   follow rescue plan if symptoms flare-up- for your breathing Continue pacing activity to avoid episodes of shortness of breath Continue monitoring and recording your blood sugars, daily weights, and blood presures at home Continue using home oxygen  as prescribed Use assistive devices as needed to prevent falls- your walker Weigh yourself every day to stay on top of early fluid retention: write down your weights every day so you remember what it is from day to day: follow the weight-gain guidelines and action plan to call your doctor if you gain more than 3 lbs overnight, or 5 lbs in one week  If you are experiencing a Mental Health or Behavioral Health Crisis or need someone to talk to, please  call the Suicide and Crisis Lifeline: 988 call the [place]  National Suicide Prevention Lifeline: 650-375-7498 or TTY: 6461709429 TTY (475)623-4356) to talk to a trained counselor call 1-800-273-TALK (toll free, 24 hour hotline) go to [facility] [address], [place] [phone]) call the [facility] Crisis Line: [phone] call 911   Patient verbalizes understanding of instructions and care plan provided today and agrees to view in MyChart.  Active MyChart status and patient understanding of how to access instructions and care plan via MyChart confirmed with patient.     Pls call/ message for questions,  Tida Saner Mckinney Aiyonna Lucado, RN, BSN, CCRN Alumnus RN Care Manager  Transitions of Care  [facility] - [facility] [phone]: direct office

## 2023-12-31 ENCOUNTER — Other Ambulatory Visit (HOSPITAL_COMMUNITY)

## 2023-12-31 ENCOUNTER — Ambulatory Visit: Payer: Self-pay | Admitting: Adult Health

## 2023-12-31 ENCOUNTER — Ambulatory Visit (HOSPITAL_BASED_OUTPATIENT_CLINIC_OR_DEPARTMENT_OTHER): Admitting: Adult Health

## 2023-12-31 DIAGNOSIS — S271XXD Traumatic hemothorax, subsequent encounter: Secondary | ICD-10-CM

## 2023-12-31 DIAGNOSIS — J449 Chronic obstructive pulmonary disease, unspecified: Secondary | ICD-10-CM

## 2023-12-31 MED ORDER — AMOXICILLIN-POT CLAVULANATE 875-125 MG PO TABS: 1.0000 | ORAL_TABLET | Freq: Two times a day (BID) | ORAL | 0 refills | Status: DC

## 2024-01-05 ENCOUNTER — Other Ambulatory Visit: Payer: Self-pay

## 2024-01-05 NOTE — Patient Instructions (Signed)
 Visit Information  Thank you for taking time to visit with me today. Please don't hesitate to contact me if I can be of assistance to you before our next scheduled telephone appointment.  Our next appointment is by telephone on 01/11/23 at 1015  Following is a copy of your care plan:   Goals Addressed             This Visit's Progress    VBCI Transitions of Care (TOC) Care Plan   On track    Problems:  Recent Hospitalization for treatment of hemoptysis/ hemothorax with chest tube placement- possibly due to prior mechanical fall "in March 2025"  Home Health services barrier: patient reports plans to decline home health services- enrolled into [facility] 30-day program 1 unplanned hospital admission x last 12 months Independent; lives alone; supportive local daughter involved in patient's routine care- provides transportation  Goal:  Over the next 30 days, the patient will not experience hospital readmission (On track 12/1023)  Interventions:  Transitions of Care:  Week # 5: 01/05/24-- day # 26  Durable Medical Equipment (DME) needs assessed with patient/caregiver Doctor Visits  - discussed the importance of doctor visits Post discharge activity limitations prescribed by provider reviewed Discussed current clinical condition: 01/05/24 Patient reported increase in shortness of breath from last week starting the day after last call.  Increased oxygen  to 2.5L/[place] and is compliant in all self care management which were reviewed on this call.  Follow up with pulmonologist following  CXR results show an antibiotic was prescribed for 10 days. (See Med. List) 01/07/24 follow up again with pulmonologist.  Patient states feeling chronically sleepy and lethargic:  Reviewed blood pressure, weights, blood sugars. Blood sugars elevated from his normal. Weight up slightly. Blood pressure up slightly, all patient reported.  [facility]24 Ongoing:  Revisited and confirmed  patient continues to routinely monitor/ record daily weights at home: he again reports "weights still at baseline: 01/05/24: Today was 194 lbs per patient and stated normal range is 188-191 lbs". Discussed this is a 3 lb difference from last week in relation to shortness of breath issues, reviewed medications, oxygen  use, rescue plan, what to call provider for.  Reinforced rationale for daily weight monitoring at home along with weight gain guidelines/ action plan for weight gain; confirmed patient does not currently take/ is not prescribed diuretic therapy Confirmed/ reinforced action plan for shortness of breath: verbalizes excellent ongoing understanding/ adherence to same: rest, use of home O2/ rescue inhalers Reviewed education around benefit of conservative post-hospital discharge activity; need to pace activity without over-doing, continue using assistive devices- walker Reviewed recent blood pressures at home:  01/05/24" Blood pressure at home reading today of 167/72, which is a bit higher than his reported normal range.  Has annual wellness visit scheduled by telephone 01/11/24 Reinforced and reviewed blood sugar management at home; need to follow carbohydrate modified diet; use of short- and long- acting insulin - patient verbalizes excellent baseline understanding of same Again, patient reports again "blood sugars still running high because since my hospital discharge I am using only insulin  and jardiance - because of the prednisone , they run anywhere between 150's- 200's all the time; mostly lately between 180-200's".  Today blood sugar prior to breakfast was patient reported at 277.  Reports his [facility] PCP, "Dr. Rickey Charm and his pharmacist" manage his diabetes/ medication optimization: he reported last week that he has [facility] provider office visit "in July" which he "plans to attend as  scheduled" and Has follow up with [facility] January 08, 2024 No falls reported.   COPD Interventions: Reviewed  01/05/24  Advised patient to track and manage COPD triggers Advised patient to self assesses COPD action plan zone and make appointment with provider if in the yellow zone for 48 hours without improvement Discussed the importance of adequate rest and management of fatigue with COPD Provided instruction about proper use of medications used for management of COPD including inhalers Use of home oxygen   Patient Self Care Activities: Reviewed 01/05/24 Attend all scheduled provider appointments Call provider office for new concerns or questions  Participate in Transition of Care Program/Attend TOC scheduled calls Take medications as prescribed   follow rescue plan if symptoms flare-up- for your breathing Continue pacing activity to avoid episodes of shortness of breath Continue monitoring and recording your blood sugars, daily weights, and blood presures at home Continue using home oxygen  as prescribed Use assistive devices as needed to prevent falls- your walker Weigh yourself every day to stay on top of early fluid retention: write down your weights every day so you remember what it is from day to day: follow the weight-gain guidelines and action plan to call your doctor if you gain more than 3 lbs overnight, or 5 lbs in one week.  Encouraged to call provider on shortness of breath and some changes to blood pressure, blood sugars, weight, lethargy/sleepiness but patient declined and wants to wait till appointment on 01/07/24. Does indicated he would call 911 or go to Med [facility] if needed.   Plan:  Telephone follow up appointment with care management team member scheduled for:  Tuesday 01/11/24 at 10:15 am-  Monday 01/11/24 with primary RN CM- for possible TOC case closure if no hospital re-admission Encouraged to call provider on shortness of breath and some changes to blood pressure, blood sugars, weight, lethargy/sleepiness but patient declined and wants to wait till appointment on  01/07/24. Does indicated he would call 911 or go to Med [facility] if needed.   Plan for next week's call:  Review ongoing medication adherence: Any changes to medications post pulmonary provider office visit on 01/07/24 Assess shortness of breath status/any actions taken.  Review daily weights/ blood sugar/ blood pressure trends since Wednesday 01/05/24 Reinforce education re: self health management of COPD/ CHF/ DM: daily weights, blood sugars; use of home O2, rescue inhaler Review any upcoming provider appointments Confirm no new falls/ using assistive devices             Patient verbalizes understanding of instructions and care plan provided today and agrees to view in MyChart. Active MyChart status and patient understanding of how to access instructions and care plan via MyChart confirmed with patient.     Telephone follow up appointment with care management team member scheduled for: January 11, 2024 at 1015 with Laine Tousey RN CM.   Please call the care guide team at [phone] if you need to cancel or reschedule your appointment.   Please call the Suicide and Crisis Lifeline: 988 call the [place]  National Suicide Prevention Lifeline: 845-021-7035 or TTY: 7130024557 TTY [phone]) to talk to a trained counselor if you are experiencing a Mental Health or Behavioral Health Crisis or need someone to talk to.   Katheryn Pandy MSN, RN RN Case Sales executive Health  VBCI-Population Health Office Hours M-F [phone] Direct Dial: [phone] Main Phone [phone]  Fax: [phone] [email or web address]

## 2024-01-05 NOTE — Transitions of Care (Post Inpatient/ED Visit) (Signed)
 Transition of Care Week #5  Visit Note  01/05/2024  Name: ARLYN BUERKLE MRN: [id number]          DOB: 1941-03-29  Situation: Patient enrolled in [facility] 30-day program. Visit completed with patient by telephone.   Background: Background:  Recent hospitalization May 8-13, 2025 for hemoptysis with hemothorax requiring chest tube placement Fragile state of health, multiple progressing chronic health conditions- COPD/ DMII; on home O2 at baseline, 2-3 L/min.  Assessment: Patient reports feeling more SHOB than last week call and that is started the day after last TOC call. He has been started on antibiotic after follow up appointment/CXR with pulmonologist.  He reports feeling chronically sleepy and lethargic in addition to the chronic and slightly increased SHOB. He has increased his oxygen  flow rate to 2.5L/[place] at this call and goes up to 3L/[place] if needed. SpO2 home readings at 93%. He does not feel it is bad enough to call provider, though this was discussed along with his rescue plan. He reports compliance with weights, blood sugars, blood pressure monitoring and recording. States he has been dealing with this a long time and feels well versed  in self management. Despite reported increase in [facility] patient was able to carry on conversation without notable breath sounds/wheezing/coughing or difficulty speaking full sentences. Reports increase in blood sugars since off Metformin , patient also on prednisone . [facility] 2024 to discuss plan. Patient stated he will go to [facility] if conditions worsens prior to 01/07/24 follow up.     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   DEGENERATIVE JOINT DISEASE 11/23/2009   DEPRESSION 11/23/2009   FATIGUE 11/23/2009   GAIT DISTURBANCE 12/10/2009   HEMOPTYSIS UNSPECIFIED 05/07/2010   High cholesterol    HYPERTENSION 07/30/2009   HYPOTHYROIDISM 07/30/2009   Ischemic cardiomyopathy 09/02/2017   LUMBAR RADICULOPATHY, 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   On home oxygen  therapy    "2-3L; 24/7" (09/10/2016)   OSA on CPAP    Pneumonia    PTSD (post-traumatic stress disorder) 03/10/2012   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   RA (rheumatoid arthritis) (HCC) 06/11/2011   "qwhere" (09/10/2016)   RESPIRATORY FAILURE, CHRONIC 07/31/2009   Scleritis of both eyes 03/17/2014   Thrombocytopenia (HCC)    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   Type II diabetes mellitus (HCC)     Assessment: Patient Reported Symptoms: Cognitive Cognitive Status: Able to follow simple commands, Alert and oriented to person, place, and time, Insightful and able to interpret abstract concepts, Normal speech and language skills Cognitive/Intellectual Conditions Management [RPT]: Not Assessed   Health Maintenance Behaviors: Annual physical exam, Healthy diet, Sleep adequate, Hobbies Healing Pattern: Unsure Health Facilitated by: Healthy diet, Rest  Neurological Neurological Review of Symptoms: No symptoms reported    HEENT HEENT Symptoms Reported: No symptoms reported      Cardiovascular Cardiovascular Symptoms Reported: Swelling in legs or feet Other Cardiovascular Symptoms: Takes medications as prescribed, monitors daily weights, states weight is stable. 194 lbs this am. Does patient have uncontrolled Hypertension?: No Cardiovascular Conditions: Hypertension, Dysrhythmia, Heart failure Cardiovascular Management Strategies: Adequate rest,  Activity, Routine screening, Diet modification, Weight management, Medication therapy Do You Have a Working Readable Scale?:  Yes Weight: 194 lb (88 kg) Cardiovascular Self-Management Outcome: 4 (good) Cardiovascular Comment: Discussed continuuing to monitor weights and records in light of some increase or consistent Shortness of breath and encouraged to call providers if needed prior to pulmonary follow up on 01/07/24.  Respiratory Respiratory Symptoms Reported: Shortness of breath Other Respiratory Symptoms: Continues with oxygen  at 2 1/2 liters at present due to some increase in SOB over past week. A bit worse than normal baseline SHOB patient states. Additional Respiratory Details: Reviewed results from pulmonary vist on 12/29/23 and addition of antibitotic prescribed based on CXR results. Pending follow up 01/07/24 Respiratory Conditions: COPD, Shortness of breath Respiratory Self-Management Outcome: 4 (good) Respiratory Comment: Feels he is doing everything he is supposed to and unsure why he feels lethargic and more SOB. Has SpO2 monitor recording 93% at time of call. Also states he feels this is part of his disease process.  Endocrine Patient reports the following symptoms related to hypoglycemia or hyperglycemia : Other (Sleepiness/lethargy) Is patient diabetic?: Yes Is patient checking blood sugars at home?: Yes Endocrine Conditions: Diabetes, Thyroid  disorder Endocrine Management Strategies: Activity, Adequate rest, Medical device, Medication therapy, Routine screening, Coping strategies Endocrine Self-Management Outcome: 4 (good) Endocrine Comment: Patient states blood sugars have been higher since discharge off Metformin . [facility] 08, 2024. Has Dexcom.  Gastrointestinal Gastrointestinal Symptoms Reported: No symptoms reported Additional Gastrointestinal Details: Appeitie is reported as good, Regular BM, no urination issues, taking fluids regularly.      Genitourinary Genitourinary Symptoms Reported: No symptoms reported    Integumentary Integumentary Symptoms Reported: No  symptoms reported    Musculoskeletal Musculoskelatal Symptoms Reviewed: Unsteady gait, Difficulty walking Additional Musculoskeletal Details: using walker at baseline, no falls reported since last TOC call. SOB and lethargy. Musculoskeletal Conditions: Mobility limited, Other Other Musculoskeletal Conditions: LE PVD Musculoskeletal Management Strategies: Coping strategies, Medication therapy, Medical device, Routine screening Falls in the past year?: Yes (None in last week since last TOC call. Patient stated he feels fall was cause of hemothorax but fall is not reported in DC Summary.) Number of falls in past year: 1 or less Patient at Risk for Falls Due to: History of fall(s), Impaired mobility, Other (Comment) (use of assistive device, lethargy, SOB,) Fall risk Follow up: Education provided, Falls evaluation completed  Psychosocial Psychosocial Symptoms Reported: No symptoms reported     Do you feel physically threatened by others?: No   Vitals:   01/05/24 1057  BP: (!) 162/77  SpO2: 93%    Medications Reviewed Today     Reviewed by Areta Beer, RN (Case Manager) on 01/05/24 at 1122  Med List Status: <None>   Medication Order Taking? Sig Documenting Provider Last Dose Status Informant  acetaminophen  (TYLENOL ) 500 MG tablet [id number] Yes Take 1,000 mg by mouth every 6 (six) hours as needed for mild pain. [provider] Taking Active Multiple Informants, Self, Pharmacy Records, Other  albuterol  (PROVENTIL ) (2.5 MG/3ML) 0.083% nebulizer solution [id number] Yes Take 3 mLs (2.5 mg total) by nebulization every 6 (six) hours as needed for wheezing or shortness of breath. Parrett, Macdonald Savoy, NP Taking Active   albuterol  (VENTOLIN  HFA) 108 (90 Base) MCG/ACT inhaler [id number] Yes INHALE 2 PUFFS INTO THE LUNGS EVERY 6 HOURS AS NEEDED FOR WHEEZING OR SHORTNESS OF BREATH Parrett, Tammy S, NP Taking Active Self, Pharmacy Records, Multiple Informants, Other  amoxicillin -clavulanate  (AUGMENTIN )  875-125 MG tablet [id number] Yes Take 1 tablet by mouth 2 (two) times daily. Parrett, Macdonald Savoy, NP Taking Active            Med Note Parke Boll, Jazziel Fitzsimmons B   Tue Jan 05, 2024 11:19 AM) New medication post pulmonary follow up.   aspirin  81 MG chewable tablet [id number] Yes Chew 81 mg by mouth daily. 12/30/23: Patient reports during [facility] call: [facility] provider instructed to start taking on 12/23/23 (verified through review of EHR) in light of Plavix  continued to being held post- recent hospital discharge on 12/08/23 Adelia Homestead, MD Taking Active Self           Med Note (TOUSEY, LAINE M   Wed Dec 30, 2023 10:58 AM) 12/30/23: Patient reports during [facility] call: [facility] provider instructed to start taking on 12/23/23 (verified through review of EHR) in light of Plavix  continued to being held post- recent hospital discharge on 12/08/23   atorvastatin  (LIPITOR ) 40 MG tablet [id number] Yes Take 1 tablet (40 mg total) by mouth daily. Elmyra Haggard, MD Taking Active Multiple Informants, Self, Pharmacy Records, Other  Carboxymethylcellul-Glycerin (LUBRICATING EYE DROPS OP) [id number] Yes Place 1 drop into both eyes 4 (four) times daily as needed (dry eyes).  [provider] Taking Active Multiple Informants, Self, Pharmacy Records, Other  carvedilol  (COREG ) 3.125 MG tablet [id number] Yes TAKE 1 TABLET(3.125 MG) BY MOUTH TWICE DAILY WITH A MEAL  Patient taking differently: Take 3.125 mg by mouth 2 (two) times daily with a meal.   Adelia Homestead, MD Taking Active Self, Pharmacy Records, Multiple Informants, Other  Cholecalciferol  (VITAMIN D3) 2000 units capsule [id number] Yes Take 2,000 Units by mouth daily.  [provider] Taking Active Multiple Informants, Self, Pharmacy Records, Other  cyanocobalamin  (VITAMIN B12) 500 MCG tablet [id number] Yes Take 500 mcg by mouth daily. [provider] Taking Active Self, Pharmacy Records, Multiple Informants, Other  empagliflozin  (JARDIANCE ) 10 MG TABS  tablet [id number] Yes Take 1 tablet (10 mg total) by mouth daily before breakfast. Lenise Quince, MD Taking Active   ferrous sulfate  324 (65 Fe) MG TBEC [id number] Yes Take 324 mg by mouth daily. [provider] Taking Active Self, Pharmacy Records, Multiple Informants, Other  fluticasone  (FLONASE ) 50 MCG/ACT nasal spray [id number] Yes Place 1 spray into both nostrils daily as needed for allergies. Audria Leather, MD Taking Active   furosemide  (LASIX ) 20 MG tablet [id number] Yes Take 1 tablet (20 mg total) by mouth daily as needed. Parrett, Macdonald Savoy, NP Taking Active            Med Note (TOUSEY, LAINE M   Wed Dec 30, 2023 10:59 AM) 12/30/23: Patient reports during [facility] call: not taking "regularly;" "only as needed" has not needed regularly per patient report   guaifenesin  (HUMIBID E) 400 MG TABS tablet [id number] Yes Take 400 mg by mouth in the morning and at bedtime. [provider] Taking Active Self, Pharmacy Records, Multiple Informants, Other  imiquimod (ALDARA) 5 % cream [id number] Yes Apply 1 Application topically 3 (three) times a week. APPLY TO WARTS 3-5 TIMES PER WEEK FOR 6 WEEKS OR UNTIL WARTS DISAPPEAR [provider] Taking Active Self, Pharmacy Records, Multiple Informants, Other  insulin  aspart (NOVOLOG ) 100 UNIT/ML injection [id number] Yes Inject 0-12 Units into the skin 3 (three) times daily before meals. [provider] Taking Active Self, Pharmacy Records, Multiple Informants, Other  insulin  glargine (LANTUS ) 100 UNIT/ML injection [id number] Yes Inject 20 Units into the skin daily. [provider] Taking Active Multiple Informants, Self, Pharmacy Records, Other  levothyroxine  (SYNTHROID ) 125 MCG tablet [id number] Yes Take 125 mcg by mouth daily before breakfast. [provider] Taking Active Self, Pharmacy Records, Multiple Informants, Other  losartan  (COZAAR ) 100 MG tablet [id number] Yes Take 1 tablet (100 mg total) by mouth daily. Lenise Quince, MD Taking Active   Multiple Vitamins-Minerals (MEGA MULTIVITAMIN FOR MEN PO) [id number] Yes Take 1 tablet by mouth daily. [provider] Taking Active Multiple Informants, Self, Pharmacy Records, Other  Olodaterol HCl 2.5 MCG/ACT AERS [id number]  Inhale 2 puffs into the lungs daily at 12 noon. [provider]  Active Self, Pharmacy Records, Multiple Informants, Other  OXYGEN  [id number] Yes Inhale 2 L into the lungs continuous. [provider] Taking Active Multiple Informants, Self, Pharmacy Records, Other           Med Note (Yomaris Palecek B   Tue Jan 05, 2024 11:21 AM) Oxygen  2-3L/[place]. Today was on 2.5L/[place] due to SOB.  predniSONE  (DELTASONE ) 10 MG tablet [id number] Yes Take 1 tablet (10 mg total) by mouth daily with breakfast. Cameron Cea, MD Taking Active Self, Pharmacy Records, Multiple Informants, Other           Med Note Baltazar Leventhal, Alethia Huxley   Fri Dec 04, 2023 10:28 AM) Dose was recently changed from 15 mg to 10 mg  Recent Dispenses 11/30/2023 10 MG TABS (disp 120, 60d supply) 10/30/2023 5 MG TABS (disp 180, 90d supply) 10/22/2023 20 MG TABS (disp 90, 90d supply) 09/22/2023 5 MG TABS (disp 90, 90d supply) 08/04/2023 20 MG TABS (disp 90, 90d supply)   pregabalin  (LYRICA ) 100 MG capsule [id number] Yes Take 100 mg by mouth daily. [provider] Taking Active Self, Pharmacy Records, Multiple Informants, Other  Semaglutide  (OZEMPIC , 1 MG/DOSE, [place]) [id number] Yes Inject 1 mg into the skin every Friday. [provider] Taking Active Multiple Informants, Self, Pharmacy Records, Other  sertraline  (ZOLOFT ) 100 MG tablet [id number] Yes Take 50 mg by mouth daily. [provider] Taking Active Self, Pharmacy Records, Multiple Informants, Other  tamsulosin  (FLOMAX ) 0.4 MG CAPS capsule [id number] Yes Take 1 capsule (0.4 mg total) by mouth at bedtime. Jobe Mulder, DO Taking Active Multiple Informants, Self, Pharmacy Records, Other   Tiotropium Bromide -Olodaterol (STIOLTO RESPIMAT ) 2.5-2.5 MCG/ACT AERS [id number] No Inhale 2 puffs into the lungs daily.  Patient not taking: Reported on 01/05/2024   ParrettMacdonald Savoy, NP Not Taking Active Multiple Informants, Self, Pharmacy Records, Other           Med Note (TOUSEY, LAINE M   Thu Dec 10, 2023 11:16 AM) 12/10/23: Reports during TOC call he is no longer taking- reports "using another inhaler the pulmonary doctor gave me for maintenance inhaler"  traZODone  (DESYREL ) 50 MG tablet [id number] Yes Take 50 mg by mouth at bedtime. [provider] Taking Active Self, Pharmacy Records, Multiple Informants, Other  Med List Note Horace Lye 02/16/11 1841): Patient uses [facility] in [place] (med order)            Recommendation:   Continue Current Plan of Care Attend follow up visits as planned.   Follow Up Plan:   Telephone follow-up in 1 week 01/11/2024 with Laine Tousey at 1015 am (Already scheduled).  Contact Providers or call 911/Go to ED if [facility] worsens past what patient feels is manageable or within his normal range of shortness of breath.    Katheryn Pandy MSN, RN RN Case Sales executive Health  [employer] Office Hours M-F [id number]-430p Direct Dial: [phone] Main Phone [phone]  Fax: [phone] [email or web address]

## 2024-01-07 ENCOUNTER — Encounter (HOSPITAL_BASED_OUTPATIENT_CLINIC_OR_DEPARTMENT_OTHER): Payer: Self-pay | Admitting: Adult Health

## 2024-01-07 ENCOUNTER — Ambulatory Visit (INDEPENDENT_AMBULATORY_CARE_PROVIDER_SITE_OTHER)

## 2024-01-07 ENCOUNTER — Ambulatory Visit (HOSPITAL_BASED_OUTPATIENT_CLINIC_OR_DEPARTMENT_OTHER): Admitting: Adult Health

## 2024-01-07 VITALS — BP 138/74 | HR 62 | Wt 191.0 lb

## 2024-01-07 DIAGNOSIS — J449 Chronic obstructive pulmonary disease, unspecified: Secondary | ICD-10-CM

## 2024-01-07 DIAGNOSIS — J942 Hemothorax: Secondary | ICD-10-CM | POA: Diagnosis not present

## 2024-01-07 DIAGNOSIS — J432 Centrilobular emphysema: Secondary | ICD-10-CM

## 2024-01-07 DIAGNOSIS — J929 Pleural plaque without asbestos: Secondary | ICD-10-CM | POA: Diagnosis not present

## 2024-01-07 DIAGNOSIS — S271XXD Traumatic hemothorax, subsequent encounter: Secondary | ICD-10-CM

## 2024-01-07 DIAGNOSIS — J189 Pneumonia, unspecified organism: Secondary | ICD-10-CM

## 2024-01-07 DIAGNOSIS — I5022 Chronic systolic (congestive) heart failure: Secondary | ICD-10-CM | POA: Diagnosis not present

## 2024-01-07 DIAGNOSIS — R918 Other nonspecific abnormal finding of lung field: Secondary | ICD-10-CM | POA: Diagnosis not present

## 2024-01-07 DIAGNOSIS — Z87891 Personal history of nicotine dependence: Secondary | ICD-10-CM

## 2024-01-07 DIAGNOSIS — J9611 Chronic respiratory failure with hypoxia: Secondary | ICD-10-CM

## 2024-01-07 DIAGNOSIS — J90 Pleural effusion, not elsewhere classified: Secondary | ICD-10-CM | POA: Diagnosis not present

## 2024-01-07 MED ORDER — ALBUTEROL SULFATE HFA 108 (90 BASE) MCG/ACT IN AERS: 1.0000 | INHALATION_SPRAY | Freq: Four times a day (QID) | RESPIRATORY_TRACT | 5 refills | Status: DC | PRN

## 2024-01-07 MED ORDER — AMOXICILLIN-POT CLAVULANATE 875-125 MG PO TABS
1.0000 | ORAL_TABLET | Freq: Two times a day (BID) | ORAL | 0 refills | Status: DC
Start: 2024-01-07 — End: 2024-01-23

## 2024-01-07 NOTE — Progress Notes (Signed)
 @Patient  ID: [name], male    DOB: 06-Jul-1941, 83 y.o.   MRN: [id number]  Chief Complaint  Patient presents with   Follow-up    Referring provider: Adelia Aguilar, *  HPI: 83 year old male former smoker followed for very severe COPD, chronic hypoxic respiratory failure on oxygen , obstructive sleep apnea on CPAP.  History of lung nodule followed on serial imaging Traumatic left-sided hemothorax (mechanical fall)-admitted May 2025 status post chest tube drainage Medical history significant for rheumatoid arthritis, diabetes, congestive heart failure, coronary artery disease, chronic kidney disease, atrial fibrillation with previous use of amiodarone -stopped in August 2019.  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 PFT 10/2015 FEV1 was 36%, ratio 46, FVC 58%, DLCO 26%    CT chest 12/2016 -masslike consolidation in LUL   PET scan 03/20/17 >+metabolic act in LUL consolidation    CT chest 08/2017 >> 2.4 x 3.5 cm triangular subpleural opacity in the anterior left upper lobe > has resolved the platelike scarring bilateral nodular infiltrates stable,   CT chest 09/2020 severe  emphysema, extensive pleural calcification.  Previous nodule in the right lower lobe has significantly decreased in size measuring 0.8 x 0.8 cm previously at 2.3 cm.  A stable left upper lobe cavitary nodule measuring 4 mm.  And a stable irregular nodule in the left lower lobe measuring 1.5 cm, and a left lower lobe nodule measuring 1.5 cm   CT chest October 22, 2021 that showed a stable uncomplicated aneurysmal dilatation along the aortic arch.  Interval increase of the abdominal aorta from 33 mm to 36 mm.,  Advanced emphysema.  Stable chronic extensive pleural calcifications, no worrisome pulmonary nodules.  Ill-defined left lower lobe pulmonary nodules unchanged since October 2020. Extensive cholelithiasis   CT chest 09/2022 Grossly stable 4.2 cm focal aneurysm seen involving aortic arch. Stable calcified pleural plaques and associated scarring are noted in right lung.Minimally displaced fractures are seen involving the left fourth, fifth, sixth and seventh ribs.  01/07/2024 Follow up  : Traumatic Hemothorax -Left sided, COPD, O2 RF  Patient presents for a 1 week follow-up.  Patient was recently hospitalized last month for a left-sided traumatic hemothorax.  Patient had a mechanical fall in late March-fell onto the left side.  On surveillance annual CT angio chest November 17, 2023 showed interval development of a focal pleural thickening in the left superior hemothorax measuring 6.7 x 4.3 cm.  At that time there was concern for possible underlying malignancy.  Patient was set up for a PET scan.  Patient was seen in the office on Dec 03, 2023 with hemoptysis and acute respiratory distress.  Chest x-ray showed opacification of the left apex.  Patient was admitted to the hospital.  CT chest showed enlarging soft tissue density in the left apex.  Chest tube was placed by interventional radiology.  Patient has gradual clinical improvement and decreased left apical collection.  Patient was seen in the office last  week, chest x-ray showed new left upper lobe opacity concerning for pneumonia and a left small pleural effusion.  Patient was started on a 10-day course of Augmentin  currently on day 9 out 10 .    Continues to have some ongoing shortness of breath.  Denies any hemoptysis. Feels okay but remains weak, has not regained his strength since discharge. Remains on O2 2l/m rest and 3l/m with activity. No increased oxygen  demands.  Chest x-ray today shows improved left upper lobe opacity, left apical pleural thickening and small bilateral pleural effusions.   No Known Allergies  Immunization History  Administered Date(s) Administered   Fluad Quad(high Dose 65+) 04/26/2019, 06/08/2020, 05/01/2021   Fluad Trivalent(High Dose 65+) 04/23/2023   Influenza Split 04/14/2011, 04/27/2013, 03/28/2014   Influenza Whole 05/28/2009, 04/27/2010, 03/31/2012   Influenza, High Dose Seasonal PF 04/10/2010, 04/11/2011, 04/19/2013, 03/21/2015, 04/05/2016, 05/16/2017, 03/28/2018, 04/08/2018   Influenza,inj,Quad PF,6+ Mos 03/17/2014, 04/04/2016   Influenza-Unspecified 04/30/2006, 04/28/2007, 04/28/2008, 05/29/2009, 04/10/2010, 05/26/2011, 05/07/2012, 05/28/2020, 05/01/2021, 04/27/2022   PFIZER Comirnaty(Gray Top)Covid-19 Tri-Sucrose Vaccine 11/29/2020, 05/01/2021   PFIZER(Purple Top)SARS-COV-2 Vaccination 08/19/2019, 09/09/2019, 03/15/2020, 04/08/2020, 12/09/2020   PNEUMOCOCCAL CONJUGATE-20 06/27/2022   Pfizer Covid-19 Vaccine Bivalent Booster 66yrs & up 05/01/2021   Pfizer(Comirnaty)Fall Seasonal Vaccine 12 years and older 05/03/2022, 04/23/2023   Pneumococcal Conjugate-13 09/20/2013, 03/29/2015   Pneumococcal Polysaccharide-23 05/28/2008, 04/25/2010, 04/19/2013   Pneumococcal-Unspecified 06/26/2006, 04/27/2013   Rsv, Bivalent, Protein Subunit Rsvpref,pf [name]) 07/30/2022   Td 11/23/2009   Tdap 07/17/2011, 07/30/2022   Zoster, Live 04/28/2009, 11/08/2009    Past Medical History:  Diagnosis Date   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   ablated   Anxiety    Aortic aneurysm (HCC) 09/02/2017   BENIGN PROSTATIC HYPERTROPHY 11/23/2009   Cardiomyopathy (HCC) 08/28/2016   Chronic systolic CHF (congestive heart failure) (HCC) 09/02/2017   COPD (chronic obstructive pulmonary disease) (HCC)    CORONARY ARTERY DISEASE 11/23/2009   DECREASED HEARING, 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   DEGENERATIVE JOINT DISEASE 11/23/2009   DEPRESSION 11/23/2009   FATIGUE 11/23/2009   GAIT DISTURBANCE 12/10/2009   HEMOPTYSIS UNSPECIFIED 05/07/2010   High cholesterol    HYPERTENSION 07/30/2009   HYPOTHYROIDISM 07/30/2009   Ischemic cardiomyopathy 09/02/2017   LUMBAR RADICULOPATHY, 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   On home oxygen  therapy    2-3L; 24/7 (09/10/2016)   OSA on CPAP    Pneumonia    PTSD (post-traumatic stress disorder) 03/10/2012   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   RA (rheumatoid arthritis) (HCC) 06/11/2011   qwhere (09/10/2016)   RESPIRATORY FAILURE, CHRONIC 07/31/2009   Scleritis of both eyes 03/17/2014   Thrombocytopenia (HCC)    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   Type II diabetes mellitus (HCC)     Tobacco History: Social History   Tobacco Use  Smoking Status Former   Current packs/day: 0.00   Average packs/day: 2.5 packs/day for 40.0 years (100.0 ttl pk-yrs)   Types: Cigarettes, Pipe, Cigars   Start date: 07/28/1958   Quit date: 07/28/1998   Years since quitting: 25.4   Passive exposure: Never  Smokeless Tobacco Never   Counseling given: Not Answered   Outpatient Medications Prior to Visit  Medication Sig Dispense Refill   acetaminophen  (TYLENOL ) 500 MG tablet Take 1,000 mg by mouth every 6 (six) hours as needed for  mild pain.     albuterol  (PROVENTIL ) (2.5 MG/3ML) 0.083% nebulizer solution Take 3 mLs (2.5 mg total) by nebulization every 6 (six) hours as needed for wheezing or shortness of breath. 120 mL 12   albuterol  (VENTOLIN  HFA) 108 (90 Base) MCG/ACT inhaler INHALE 2 PUFFS INTO THE LUNGS EVERY 6 HOURS AS NEEDED FOR WHEEZING OR SHORTNESS OF BREATH 54 g 3    amoxicillin -clavulanate (AUGMENTIN ) 875-125 MG tablet Take 1 tablet by mouth 2 (two) times daily. 20 tablet 0   aspirin  81 MG chewable tablet Chew 81 mg by mouth daily. 12/30/23: Patient reports during [facility] call: [facility] provider instructed to start taking on 12/23/23 (verified through review of EHR) in light of Plavix  continued to being held post- recent hospital discharge on 12/08/23     atorvastatin  (LIPITOR ) 40 MG tablet Take 1 tablet (40 mg total) by mouth daily. 90 tablet 3   Carboxymethylcellul-Glycerin (LUBRICATING EYE DROPS OP) Place 1 drop into both eyes 4 (four) times daily as needed (dry eyes).      carvedilol  (COREG ) 3.125 MG tablet TAKE 1 TABLET(3.125 MG) BY MOUTH TWICE DAILY WITH A MEAL (Patient taking differently: Take 3.125 mg by mouth 2 (two) times daily with a meal.) 60 tablet 3   Cholecalciferol  (VITAMIN D3) 2000 units capsule Take 2,000 Units by mouth daily.      cyanocobalamin  (VITAMIN B12) 500 MCG tablet Take 500 mcg by mouth daily.     empagliflozin  (JARDIANCE ) 10 MG TABS tablet Take 1 tablet (10 mg total) by mouth daily before breakfast. 30 tablet 3   ferrous sulfate  324 (65 Fe) MG TBEC Take 324 mg by mouth daily.     fluticasone  (FLONASE ) 50 MCG/ACT nasal spray Place 1 spray into both nostrils daily as needed for allergies.     furosemide  (LASIX ) 20 MG tablet Take 1 tablet (20 mg total) by mouth daily as needed. 30 tablet 2   guaifenesin  (HUMIBID E) 400 MG TABS tablet Take 400 mg by mouth in the morning and at bedtime.     imiquimod (ALDARA) 5 % cream Apply 1 Application topically 3 (three) times a week. APPLY TO WARTS 3-5 TIMES PER WEEK FOR 6 WEEKS OR UNTIL WARTS DISAPPEAR     insulin  aspart (NOVOLOG ) 100 UNIT/ML injection Inject 0-12 Units into the skin 3 (three) times daily before meals.     insulin  glargine (LANTUS ) 100 UNIT/ML injection Inject 20 Units into the skin daily.     levothyroxine  (SYNTHROID ) 125 MCG tablet Take 125 mcg by mouth daily before breakfast.     losartan   (COZAAR ) 100 MG tablet Take 1 tablet (100 mg total) by mouth daily. 90 tablet 0   Multiple Vitamins-Minerals (MEGA MULTIVITAMIN FOR MEN PO) Take 1 tablet by mouth daily.     Olodaterol HCl 2.5 MCG/ACT AERS Inhale 2 puffs into the lungs daily at 12 noon.     OXYGEN  Inhale 2 L into the lungs continuous.     predniSONE  (DELTASONE ) 10 MG tablet Take 1 tablet (10 mg total) by mouth daily with breakfast. 90 tablet 3   pregabalin  (LYRICA ) 100 MG capsule Take 100 mg by mouth daily.     Semaglutide  (OZEMPIC , 1 MG/DOSE, [place]) Inject 1 mg into the skin every Friday.     sertraline  (ZOLOFT ) 100 MG tablet Take 50 mg by mouth daily.     tamsulosin  (FLOMAX ) 0.4 MG CAPS capsule Take 1 capsule (0.4 mg total) by mouth at bedtime. 30 capsule 0   Tiotropium Bromide -Olodaterol (STIOLTO RESPIMAT )  2.5-2.5 MCG/ACT AERS Inhale 2 puffs into the lungs daily. (Patient not taking: Reported on 01/05/2024) 4 g 0   traZODone  (DESYREL ) 50 MG tablet Take 50 mg by mouth at bedtime.     No facility-administered medications prior to visit.     Review of Systems:   Constitutional:   No  weight loss, night sweats,  Fevers, chills,  +fatigue, or  lassitude.  HEENT:   No headaches,  Difficulty swallowing,  Tooth/dental problems, or  Sore throat,                No sneezing, itching, ear ache, nasal congestion, post nasal drip,   CV:  No chest pain,  Orthopnea, PND, +swelling in lower extremities, anasarca, dizziness, palpitations, syncope.   GI  No heartburn, indigestion, abdominal pain, nausea, vomiting, diarrhea, change in bowel habits, loss of appetite, bloody stools.   Resp: .  No chest wall deformity  Skin: no rash or lesions.  GU: no dysuria, change in color of urine, no urgency or frequency.  No flank pain, no hematuria   MS:  No joint pain or swelling.  No decreased range of motion.  No back pain.    Physical Exam    GEN: A/Ox3; pleasant , NAD, chronically ill-appearing, on oxygen , rolling walker   HEENT:   [place]/AT,  EACs-clear, TMs-wnl, NOSE-clear, THROAT-clear, no lesions, no postnasal drip or exudate noted.   NECK:  Supple w/ fair ROM; no JVD; normal carotid impulses w/o bruits; no thyromegaly or nodules palpated; no lymphadenopathy.    RESP decreased breath sounds in the bases no accessory muscle use, no dullness to percussion  CARD:  RRR, no m/r/g, no peripheral edema, pulses intact, no cyanosis or clubbing.  GI:   Soft & nt; nml bowel sounds; no organomegaly or masses detected.   Musco: Warm bil, no deformities or joint swelling noted.   Neuro: alert, no focal deficits noted.    Skin: Warm, no lesions or rashes    Lab Results:  CBC    Component Value Date/Time   WBC 9.7 12/15/2023 1455   RBC 4.12 (L) 12/15/2023 1455   HGB 11.6 (L) 12/15/2023 1455   HGB 12.4 (L) 12/01/2023 1337   HGB 12.0 (L) 06/28/2019 1453   HCT 35.3 (L) 12/15/2023 1455   HCT 38.4 06/28/2019 1453   PLT 177.0 12/15/2023 1455   PLT 150 12/01/2023 1337   PLT 44 (LL) 06/28/2019 1453   MCV 85.7 12/15/2023 1455   MCV 89 06/28/2019 1453   MCH 27.9 12/09/2023 0254   MCHC 33.0 12/15/2023 1455   RDW 15.4 12/15/2023 1455   RDW 13.8 06/28/2019 1453   LYMPHSABS 1.1 12/15/2023 1455   LYMPHSABS 1.2 12/23/2016 1053   MONOABS 0.6 12/15/2023 1455   EOSABS 0.0 12/15/2023 1455   EOSABS 0.3 12/23/2016 1053   BASOSABS 0.1 12/15/2023 1455   BASOSABS 0.0 12/23/2016 1053    BMET    Component Value Date/Time   NA 137 12/15/2023 1455   NA 140 01/02/2023 0837   K 5.0 12/15/2023 1455   CL 98 12/15/2023 1455   CO2 26 12/15/2023 1455   GLUCOSE 219 (H) 12/15/2023 1455   BUN 33 (H) 12/15/2023 1455   BUN 27 01/02/2023 0837   CREATININE 1.33 12/15/2023 1455   CREATININE 1.17 04/13/2020 0915   CALCIUM  8.9 12/15/2023 1455   GFRNONAA 41 (L) 12/09/2023 0254   GFRNONAA >60 07/15/2018 1059   GFRAA 58 (L) 02/22/2020 1029   GFRAA >60 07/15/2018 1059  BNP    Component Value Date/Time   BNP 68.4 12/03/2023 1607     ProBNP    Component Value Date/Time   PROBNP 188 10/20/2016 1038   PROBNP 59.9 05/04/2011 0517    Imaging: DG Chest 2 View Result Date: 12/29/2023 CLINICAL DATA:  Left hemothorax. EXAM: CHEST - 2 VIEW COMPARISON:  Dec 08, 2023. FINDINGS: The heart size and mediastinal contours are within normal limits. Left-sided pacemaker is unchanged. Small left pleural effusion is noted with adjacent atelectasis or infiltrate. Minimal right pleural effusion is also noted with adjacent atelectasis. New left upper lobe opacity is noted concerning for pneumonia. The visualized skeletal structures are unremarkable. IMPRESSION: New left upper lobe opacity concerning for pneumonia. Small left pleural effusion is noted with adjacent left basilar atelectasis or infiltrate. Small right pleural effusion with adjacent atelectasis. Followup PA and lateral chest X-ray is recommended in 3-4 weeks following trial of antibiotic therapy to ensure resolution and exclude underlying malignancy. Electronically Signed   By: Rosalene Colon M.D.   On: 12/29/2023 12:12   CUP PACEART REMOTE DEVICE CHECK Result Date: 12/24/2023 PPM Scheduled remote reviewed. Normal device function.  Presenting rhythm: AP-AS/VP. 2 VHR episodes, longest ii beats on 10/18/23 at 06:38, V>A conduction at 162 bpm. Next remote 91 days. MC, CVRS PPM Scheduled remote reviewed. Normal device function.  Presenting rhythm: AP-AS/VP. 2 VHR episodes, longest 11 beats on 10/18/23 at 06:38, V>A conduction at 162 bpm. Next remote 91 days. MC, CVRS   Administration History     None          Latest Ref Rng & Units 11/16/2015    9:55 AM 12/30/2013    9:45 AM  PFT Results  FVC-Pre L 2.96  3.01   FVC-Predicted Pre % 59  59   FVC-Post L 2.89  3.01   FVC-Predicted Post % 58  59   Pre FEV1/FVC % % 43  45   Post FEV1/FCV % % 46  49   FEV1-Pre L 1.28  1.36   FEV1-Predicted Pre % 35  36   FEV1-Post L 1.33  1.48   DLCO uncorrected ml/min/mmHg 9.88  10.13   DLCO  [facility]% % 26  26   DLCO corrected ml/min/mmHg 9.94    DLCO COR %Predicted % 26    DLVA Predicted % 40  39   TLC L  6.31   TLC % Predicted %  80   RV % Predicted %  80     No results found for: NITRICOXIDE      Assessment & Plan:   No problem-specific Assessment & Plan notes found for this encounter.     Roena Clark, NP 01/07/2024

## 2024-01-07 NOTE — Patient Instructions (Addendum)
 Extend Augmentin  for 4 additional days.  Continue on Stiolto 2 puffs daily .  Albuterol  inhaler /nebulizer As needed   Mucinex  DM Twice daily  As needed  Cough/congestion .  Continue on Oxygen  2l/m rest and 3l/m with activity .  Allegra daily As needed   Flonase  daily As needed   Continue on CPAP At bedtime wear with Naps.  Activity as tolerated.  Saline nasal rinses Twice daily   Saline nasal gel At bedtime .  Follow up with cardiology.  Follow up with vascular surgeon.  Lasix  20mg  daily As needed  weight gain of 3 lbs or more /edema . (Eat banana when taking)  Daily weights  Follow up in 4-6 weeks with Dr. Villa Greaser or Martiza Speth NP and As needed  -Chest xray  Please contact office for sooner follow up if symptoms do not improve or worsen or seek emergency care

## 2024-01-08 DIAGNOSIS — J189 Pneumonia, unspecified organism: Secondary | ICD-10-CM | POA: Insufficient documentation

## 2024-01-08 NOTE — Assessment & Plan Note (Signed)
 Appears stable patient has significant symptom burden.  Continue on aggressive maintenance regimen.  Plan  Patient Instructions  Extend Augmentin  for 4 additional days.  Continue on Stiolto 2 puffs daily .  Albuterol  inhaler /nebulizer As needed   Mucinex  DM Twice daily  As needed  Cough/congestion .  Continue on Oxygen  2l/m rest and 3l/m with activity .  Allegra daily As needed   Flonase  daily As needed   Continue on CPAP At bedtime wear with Naps.  Activity as tolerated.  Saline nasal rinses Twice daily   Saline nasal gel At bedtime .  Follow up with cardiology.  Follow up with vascular surgeon.  Lasix  20mg  daily As needed  weight gain of 3 lbs or more /edema . (Eat banana when taking)  Daily weights  Follow up in 4-6 weeks with Dr. Villa Greaser or Levorn Oleski NP and As needed  -Chest xray  Please contact office for sooner follow up if symptoms do not improve or worsen or seek emergency care

## 2024-01-08 NOTE — Assessment & Plan Note (Signed)
 Left-sided traumatic hemothorax appears to be stable.  Patient required hospitalization last month with chest tube placement and drainage.  No further hemoptysis.  Will continue to follow on surveillance serial imaging.   Plan  Patient Instructions  Extend Augmentin  for 4 additional days.  Continue on Stiolto 2 puffs daily .  Albuterol  inhaler /nebulizer As needed   Mucinex  DM Twice daily  As needed  Cough/congestion .  Continue on Oxygen  2l/m rest and 3l/m with activity .  Allegra daily As needed   Flonase  daily As needed   Continue on CPAP At bedtime wear with Naps.  Activity as tolerated.  Saline nasal rinses Twice daily   Saline nasal gel At bedtime .  Follow up with cardiology.  Follow up with vascular surgeon.  Lasix  20mg  daily As needed  weight gain of 3 lbs or more /edema . (Eat banana when taking)  Daily weights  Follow up in 4-6 weeks with Dr. Villa Greaser or Egidio Lofgren NP and As needed  -Chest xray  Please contact office for sooner follow up if symptoms do not improve or worsen or seek emergency care

## 2024-01-08 NOTE — Assessment & Plan Note (Signed)
 Overall appears to be stable.  Patient is continue to weigh daily.  If weight goes up 3 pounds to take Lasix  20 mg as needed.

## 2024-01-08 NOTE — Assessment & Plan Note (Signed)
 Possible superimposed left upper lobe pneumonia-patient has clinical and radiographical improvement.  Chest x-ray today shows decreased left upper lobe opacity.  Patient is to extend out Augmentin  for total 14-day course.  Clinically is stable.  Continue to monitor closely  Plan  Patient Instructions  Extend Augmentin  for 4 additional days.  Continue on Stiolto 2 puffs daily .  Albuterol  inhaler /nebulizer As needed   Mucinex  DM Twice daily  As needed  Cough/congestion .  Continue on Oxygen  2l/m rest and 3l/m with activity .  Allegra daily As needed   Flonase  daily As needed   Continue on CPAP At bedtime wear with Naps.  Activity as tolerated.  Saline nasal rinses Twice daily   Saline nasal gel At bedtime .  Follow up with cardiology.  Follow up with vascular surgeon.  Lasix  20mg  daily As needed  weight gain of 3 lbs or more /edema . (Eat banana when taking)  Daily weights  Follow up in 4-6 weeks with Dr. Villa Greaser or Rashan Patient NP and As needed  -Chest xray  Please contact office for sooner follow up if symptoms do not improve or worsen or seek emergency care

## 2024-01-08 NOTE — Assessment & Plan Note (Signed)
Continue on oxygen to maintain O2 saturations greater than 88 to 90%. 

## 2024-01-11 ENCOUNTER — Ambulatory Visit (INDEPENDENT_AMBULATORY_CARE_PROVIDER_SITE_OTHER): Payer: Medicare PPO

## 2024-01-11 ENCOUNTER — Other Ambulatory Visit: Payer: Self-pay | Admitting: *Deleted

## 2024-01-11 VITALS — Ht 74.0 in | Wt 191.0 lb

## 2024-01-11 VITALS — Wt 191.0 lb

## 2024-01-11 DIAGNOSIS — Z794 Long term (current) use of insulin: Secondary | ICD-10-CM

## 2024-01-11 DIAGNOSIS — E118 Type 2 diabetes mellitus with unspecified complications: Secondary | ICD-10-CM | POA: Diagnosis not present

## 2024-01-11 DIAGNOSIS — Z0000 Encounter for general adult medical examination without abnormal findings: Secondary | ICD-10-CM

## 2024-01-11 DIAGNOSIS — J439 Emphysema, unspecified: Secondary | ICD-10-CM

## 2024-01-11 NOTE — Progress Notes (Addendum)
 Subjective:  Please attest and cosign this visit due to patients primary care provider not being in the office at the time the visit was completed.  (Pt of Dr. Bambi Lever)   [name] is a 83 y.o. who presents for a Medicare Wellness preventive visit.  As a reminder, Annual Wellness Visits don't include a physical exam, and some assessments may be limited, especially if this visit is performed virtually. We may recommend an in-person follow-up visit with your provider if needed.  Visit Complete: Virtual I connected with  [name] on 01/11/24 by a audio enabled telemedicine application and verified that I am speaking with the correct person using two identifiers.  Patient Location: Home  Provider Location: Office/Clinic  I discussed the limitations of evaluation and management by telemedicine. The patient expressed understanding and agreed to proceed.  Vital Signs: Because this visit was a virtual/telehealth visit, some criteria may be missing or patient reported. Any vitals not documented were not able to be obtained and vitals that have been documented are patient reported.  VideoDeclined- This patient declined Librarian, academic. Therefore the visit was completed with audio only.  Persons Participating in Visit: Patient.  AWV Questionnaire: No: Patient Medicare AWV questionnaire was not completed prior to this visit.  Cardiac Risk Factors include: advanced age (>56men, >32 women);hypertension;dyslipidemia;male gender     Objective:    Today's Vitals   01/11/24 1423  Weight: 191 lb (86.6 kg)  Height: 6' 2 (1.88 m)   Body mass index is 24.52 kg/m.     01/11/2024    2:23 PM 12/07/2023    9:23 AM 12/03/2023    3:17 PM 04/28/2023   11:26 AM 04/08/2023   12:56 PM 01/06/2023    9:14 AM 12/25/2022   10:31 AM  Advanced Directives  Does Patient Have a Medical Advance Directive? No Unable to assess, patient is non-responsive or  altered mental status Yes Yes Yes Yes Yes  Type of Paediatric nurse Power of [place];Living will Living will Living will Healthcare Power of [place];Living will Healthcare Power of Attorney  Does patient want to make changes to medical advance directive?    No - Patient declined No - Patient declined  No - Patient declined  Copy of Healthcare Power of Attorney in Chart?    No - copy requested  No - copy requested No - copy requested  Would patient like information on creating a medical advance directive? No - Patient declined          Current Medications (verified) Outpatient Encounter Medications as of 01/11/2024  Medication Sig   acetaminophen  (TYLENOL ) 500 MG tablet Take 1,000 mg by mouth every 6 (six) hours as needed for mild pain.   albuterol  (PROVENTIL ) (2.5 MG/3ML) 0.083% nebulizer solution Take 3 mLs (2.5 mg total) by nebulization every 6 (six) hours as needed for wheezing or shortness of breath.   albuterol  (VENTOLIN  HFA) 108 (90 Base) MCG/ACT inhaler Inhale 1-2 puffs into the lungs every 6 (six) hours as needed for wheezing or shortness of breath.   amoxicillin -clavulanate (AUGMENTIN ) 875-125 MG tablet Take 1 tablet by mouth 2 (two) times daily.   amoxicillin -clavulanate (AUGMENTIN ) 875-125 MG tablet Take 1 tablet by mouth 2 (two) times daily.   aspirin  81 MG chewable tablet Chew 81 mg by mouth daily. 12/30/23: Patient reports during [facility] call: [facility] provider instructed to start taking on 12/23/23 (verified through review of EHR) in light of Plavix  continued to being  held post- recent hospital discharge on 12/08/23   atorvastatin  (LIPITOR ) 40 MG tablet Take 1 tablet (40 mg total) by mouth daily.   Carboxymethylcellul-Glycerin (LUBRICATING EYE DROPS OP) Place 1 drop into both eyes 4 (four) times daily as needed (dry eyes).    carvedilol  (COREG ) 3.125 MG tablet TAKE 1 TABLET(3.125 MG) BY MOUTH TWICE DAILY WITH A MEAL   Cholecalciferol  (VITAMIN D3) 2000 units capsule Take 2,000  Units by mouth daily.    cyanocobalamin  (VITAMIN B12) 500 MCG tablet Take 500 mcg by mouth daily.   empagliflozin  (JARDIANCE ) 10 MG TABS tablet Take 1 tablet (10 mg total) by mouth daily before breakfast.   ferrous sulfate  324 (65 Fe) MG TBEC Take 324 mg by mouth daily.   fluticasone  (FLONASE ) 50 MCG/ACT nasal spray Place 1 spray into both nostrils daily as needed for allergies.   furosemide  (LASIX ) 20 MG tablet Take 1 tablet (20 mg total) by mouth daily as needed.   guaifenesin  (HUMIBID E) 400 MG TABS tablet Take 400 mg by mouth in the morning and at bedtime.   imiquimod (ALDARA) 5 % cream Apply 1 Application topically 3 (three) times a week. APPLY TO WARTS 3-5 TIMES PER WEEK FOR 6 WEEKS OR UNTIL WARTS DISAPPEAR   insulin  aspart (NOVOLOG ) 100 UNIT/ML injection Inject 0-12 Units into the skin 3 (three) times daily before meals.   insulin  glargine (LANTUS ) 100 UNIT/ML injection Inject 20 Units into the skin daily.   levothyroxine  (SYNTHROID ) 125 MCG tablet Take 125 mcg by mouth daily before breakfast.   losartan  (COZAAR ) 100 MG tablet Take 1 tablet (100 mg total) by mouth daily.   Multiple Vitamins-Minerals (MEGA MULTIVITAMIN FOR MEN PO) Take 1 tablet by mouth daily.   Olodaterol HCl 2.5 MCG/ACT AERS Inhale 2 puffs into the lungs daily at 12 noon.   OXYGEN  Inhale 2 L into the lungs continuous.   predniSONE  (DELTASONE ) 10 MG tablet Take 1 tablet (10 mg total) by mouth daily with breakfast.   pregabalin  (LYRICA ) 100 MG capsule Take 100 mg by mouth daily.   Semaglutide  (OZEMPIC , 1 MG/DOSE, [place]) Inject 1 mg into the skin every Friday.   sertraline  (ZOLOFT ) 100 MG tablet Take 50 mg by mouth daily.   tamsulosin  (FLOMAX ) 0.4 MG CAPS capsule Take 1 capsule (0.4 mg total) by mouth at bedtime.   Tiotropium Bromide -Olodaterol (STIOLTO RESPIMAT ) 2.5-2.5 MCG/ACT AERS Inhale 2 puffs into the lungs daily.   traZODone  (DESYREL ) 50 MG tablet Take 50 mg by mouth at bedtime.   No facility-administered encounter  medications on file as of 01/11/2024.    Allergies (verified) Patient has no known allergies.   History: Past Medical History:  Diagnosis Date   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   ablated   Anxiety    Aortic aneurysm (HCC) 09/02/2017   BENIGN PROSTATIC HYPERTROPHY 11/23/2009   Cardiomyopathy (HCC) 08/28/2016   Chronic systolic CHF (congestive heart failure) (HCC) 09/02/2017   COPD (chronic obstructive pulmonary disease) (HCC)    CORONARY ARTERY DISEASE 11/23/2009   DECREASED HEARING, 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   DEGENERATIVE JOINT DISEASE 11/23/2009   DEPRESSION 11/23/2009   FATIGUE 11/23/2009   GAIT DISTURBANCE 12/10/2009   HEMOPTYSIS UNSPECIFIED 05/07/2010   High cholesterol    HYPERTENSION 07/30/2009   HYPOTHYROIDISM 07/30/2009   Ischemic cardiomyopathy 09/02/2017   LUMBAR RADICULOPATHY, 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   On home oxygen  therapy    2-3L; 24/7 (09/10/2016)   OSA on CPAP    Pneumonia    PTSD (post-traumatic stress disorder) 03/10/2012  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   RA (rheumatoid arthritis) (HCC) 06/11/2011   qwhere (09/10/2016)   RESPIRATORY FAILURE, CHRONIC 07/31/2009   Scleritis of both eyes 03/17/2014   Thrombocytopenia (HCC)    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   Type II diabetes mellitus [facility])    Past Surgical History:  Procedure Laterality Date   ABDOMINAL AORTIC ANEURYSM REPAIR  07/2002   Maximo Spar 12/10/2010   ABDOMINAL AORTOGRAM W/LOWER EXTREMITY N/A 08/16/2020   Procedure: ABDOMINAL AORTOGRAM W/LOWER EXTREMITY;  Surgeon: Young Hensen, MD;  Location: [facility] INVASIVE CV LAB;  Service: Cardiovascular;  Laterality: N/A;   ABDOMINAL AORTOGRAM W/LOWER EXTREMITY N/A 10/24/2021   Procedure: ABDOMINAL AORTOGRAM W/LOWER EXTREMITY;  Surgeon: Young Hensen, MD;  Location: [facility] INVAS[facility] CV [facility];  Service: Cardiovascular;  Laterality: N/A;   ABDOMINAL AORTOGRAM W/LOWER EXTREMITY N/A 03/13/2022   Procedure: ABDOMINAL AORTOGRAM W/LOWER EXTREMITY;  Surgeon: Young Hensen, MD;  Location: [facility] INVASIVE CV [facility];   Service: Cardiovascular;  Laterality: N/A;   ABDOMINAL EXPLORATION SURGERY  02/2004   w/LOA/notes 12/10/2010; small bowel obstruction repair with adhesiolysis    BACK SURGERY     CARDIAC CATHETERIZATION     2 heart caths in the past.  One in 2000s showed one ulcerated plaque  Rx medically; Second at [facility] /notes 09/05/2016   CATARACT EXTRACTION W/ INTRAOCULAR LENS  IMPLANT, BILATERAL Bilateral 2000s   COLECTOMY     hx of remote ileum resection due to bleeding   COLONOSCOPY WITH PROPOFOL  N/A 01/22/2018   Procedure: COLONOSCOPY WITH PROPOFOL ;  Surgeon: Kenney Peacemaker, MD;  Location: [facility] ENDOSCOPY;  Service: Endoscopy;  Laterality: N/A;   CORONARY ANGIOPLASTY WITH STENT PLACEMENT  09/10/2016   CORONARY STENT INTERVENTION N/A 09/10/2016   Procedure: Coronary Stent Intervention;  Surgeon: Odie Benne, MD;  Location: [facility];  Service: Cardiovascular;  Laterality: N/A;  Distal RCA 4.0x16 Synergy   ESOPHAGOGASTRODUODENOSCOPY (EGD) WITH PROPOFOL  N/A 01/22/2018   Procedure: ESOPHAGOGASTRODUODENOSCOPY (EGD) WITH PROPOFOL ;  Surgeon: Kenney Peacemaker, MD;  Location: [facility] ENDOSCOPY;  Service: Endoscopy;  Laterality: N/A;   EYE SURGERY     FALSE ANEURYSM REPAIR Left 08/22/2020   Procedure: EXCLUSION OF LEFT POPLITEAL ARTERY ANEURYSM;  Surgeon: Young Hensen, MD;  Location: [facility] OR;  Service: Vascular;  Laterality: Left;   FEMORAL EMBOLOECTOMY Left 07/2000   with left leg ischemia; Dr. Timm Foot, vascular   FEMORAL-POPLITEAL BYPASS GRAFT Bilateral 08/22/2020   Procedure: LEFT FEMORAL-POSTERIOR TIBIAL ARTERY BYPASS GRAFT USING RIGHT GREATER NONREVERSED SAPHENOUS VEIN GRAFT;  Surgeon: Young Hensen, MD;  Location: [facility] OR;  Service: Vascular;  Laterality: Bilateral;   GANGLION CYST EXCISION Right    wrist; Dr. Lorena Rolling   HOT HEMOSTASIS N/A 01/22/2018   Procedure: HOT HEMOSTASIS (ARGON PLASMA COAGULATION/BICAP);  Surgeon: Kenney Peacemaker, MD;  Location: [facility];  Service: Endoscopy;   Laterality: N/A;   LOOP RECORDER INSERTION N/A 04/25/2019   Procedure: LOOP RECORDER INSERTION;  Surgeon: Verona Goodwill, MD;  Location: [facility] INVASIVE CV LAB;  Service: Cardiovascular;  Laterality: N/A;   LOOP RECORDER REMOVAL N/A 07/01/2019   Procedure: LOOP RECORDER REMOVAL;  Surgeon: Verona Goodwill, MD;  Location[facility] INVASIVE CV LAB;  Service: Cardiovascular;  Laterality: N/A;   LUMBAR LAMINECTOMY  1972   Dr. Bobbye Burrow   PACEMAKER IMPLANT N/A 07/01/2019   Procedure: PACEMAKER IMPLANT;  Surgeon: Verona Goodwill, MD;  Location: [facility] INVASIVE CV LAB;  Service: Cardiovascular;  Laterality: N/A;   PERIPHERAL VASCULAR BALLOON ANGIOPLASTY Left 10/24/2021   Procedure: PERIPHERAL VASCULAR  BALLOON ANGIOPLASTY;  Surgeon: Young Hensen, MD;  Location: [facility] INVASIVE CV LAB;  Service: Cardiovascular;  Laterality: Left;   RIGHT/LEFT HEART CATH AND CORONARY ANGIOGRAPHY N/A 09/10/2016   Procedure: Right/Left Heart Cath and Coronary Angiography;  Surgeon: Odie Benne, MD;  Location: [facility] INVASIVE CV LAB;  Service: Cardiovascular;  Laterality: N/A;   TONSILLECTOMY     Family History  Adopted: Yes  Problem Relation Age of Onset   Other Mother        gun shot   Social History   Socioeconomic History   Marital status: Widowed    Spouse name: Not on file   Number of children: Not on file   Years of education: Not on file   Highest education level: Not on file  Occupational History   Occupation: disabled veteran, [employer]    Employer: RETIRED  Tobacco Use   Smoking status: Former    Current packs/day: 0.00    Average packs/day: 2.5 packs/day for 40.0 years (100.0 ttl pk-yrs)    Types: Cigarettes, Pipe, Cigars    Start date: 07/28/1958    Quit date: 07/28/1998    Years since quitting: 25.4    Passive exposure: Never   Smokeless tobacco: Never  Vaping Use   Vaping status: Never Used  Substance and Sexual Activity   Alcohol  use: No    Alcohol /week: 0.0 standard drinks of alcohol    Drug  use: No   Sexual activity: Not Currently  Other Topics Concern   Not on file  Social History Narrative   Widowed   Sees [place] every 6 month for meds.   Denies asbestos exposure   Social Drivers of Corporate investment banker Strain: Low Risk  (01/11/2024)   Overall Financial Resource Strain (CARDIA)    Difficulty of Paying Living Expenses: Not hard at all  Food Insecurity: No Food Insecurity (01/11/2024)   Hunger Vital Sign    Worried About Running Out of Food in the Last Year: Never true    Ran Out of Food in the Last Year: Never true  Transportation Needs: No Transportation Needs (01/11/2024)   PRAPARE - Administrator, Civil Service (Medical): No    Lack of Transportation (Non-Medical): No  Physical Activity: Insufficiently Active (01/11/2024)   Exercise Vital Sign    Days of Exercise per Week: 4 days    Minutes of Exercise per Session: 30 min  Stress: No Stress Concern Present (01/11/2024)   [employer] of Occupational Health - Occupational Stress Questionnaire    Feeling of Stress: Not at all  Social Connections: Socially Isolated (01/11/2024)   Social Connection and Isolation Panel    Frequency of Communication with Friends and Family: More than three times a week    Frequency of Social Gatherings with Friends and Family: More than three times a week    Attends Religious Services: Never    Database administrator or Organizations: No    Attends Banker Meetings: Never    Marital Status: Widowed    Tobacco Counseling Counseling given: No    Clinical Intake:  Pre-visit preparation completed: Yes  Pain : No/denies pain     BMI - recorded: 24.52 Nutritional Status: BMI 25 -29 Overweight Nutritional Risks: None Diabetes: Yes CBG done?: Yes CBG resulted in Enter/ Edit results?: Yes (fasting - 141) Did pt. bring in CBG monitor from home?: No  Lab Results  Component Value Date   HGBA1C 8.6 (H) 12/04/2023  HGBA1C 10.3 (H) 08/21/2020    HGBA1C 9.3 (H) 04/13/2020     How often do you need to have someone help you when you read instructions, pamphlets, or other written materials from your doctor or pharmacy?: 1 - Never  Interpreter Needed?: No  Information entered by :: Kandy Orris, CMA   Activities of Daily Living     01/11/2024    2:27 PM 12/04/2023    9:00 AM  In your present state of health, do you have any difficulty performing the following activities:  Hearing? 0 1  Vision? 0 1  Difficulty concentrating or making decisions? 0 0  Walking or climbing stairs? 0   Dressing or bathing? 0   Doing errands, shopping? 0   Preparing Food and eating ? N   Using the Toilet? N   In the past six months, have you accidently leaked urine? Y   Comment wears a depend sometimes   Do you have problems with loss of bowel control? N   Managing your Medications? N   Managing your Finances? N   Housekeeping or managing your Housekeeping? N     Patient Care Team: Adelia Homestead, MD as PCP - General (Internal Medicine) Lei Pump, MD as PCP - Electrophysiology (Clinical Cardiac Electrophysiology) Lenise Quince, MD as PCP - Cardiology (Cardiology) Maire Scot, MD as Consulting Physician (Pulmonary Disease) System, Provider Not In as Consulting Physician Larrie Po, MD as Consulting Physician (Rheumatology) Lind Repine, MD as Consulting Physician (Pulmonary Disease) Lenise Quince, MD as Consulting Physician (Cardiology) Tousey, Laine M, RN as VBCI Care Management Areta Beer, RN as Case Manager  I have updated your Care Teams any recent Medical Services you may have received from other providers in the past year.     Assessment:   This is a routine wellness examination for Jhamal.  Hearing/Vision screen Hearing Screening - Comments:: Denies hearing difficulties   Vision Screening - Comments:: Wears rx glasses - up to date with routine eye exams with [facility] in  [place], [place]   Goals Addressed               This Visit's Progress     Patient Stated (pt-stated)        Patient stated he wants to walk more and watch diet       Depression Screen     01/11/2024    2:30 PM 12/10/2023   10:33 AM 04/21/2023    2:10 PM 01/06/2023    9:11 AM 01/17/2022   10:45 AM 12/13/2021    9:00 AM 10/16/2021    9:45 AM  PHQ 2/9 Scores  PHQ - 2 Score 1 0 1 0 0 0 0  PHQ- 9 Score 4  1        Fall Risk     01/11/2024    2:29 PM 01/05/2024   11:12 AM 12/29/2023    9:29 AM 12/10/2023   10:33 AM 12/03/2023    1:34 PM  Fall Risk   Falls in the past year? 1 1 0 1 1  Comment  None in last week since last TOC call. Patient stated he feels fall was cause of hemothorax but fall is not reported in DC Summary.     Number falls in past yr: 0 0  0 1  Comment 1      Injury with Fall? 0   1 0  Risk for fall due to : History of fall(s);Impaired balance/gait History  of fall(s);Impaired mobility;Other (Comment)  History of fall(s);Impaired mobility;Impaired balance/gait;Medication side effect;Other (Comment)   Risk for fall due to: Comment uses a walker use of assistive device, lethargy, SOB,  PVD- lower extremities   Follow up Falls evaluation completed;Falls prevention discussed Education provided;Falls evaluation completed  Education provided;Falls prevention discussed     MEDICARE RISK AT HOME:  Medicare Risk at Home Any stairs in or around the home?: No If so, are there any without handrails?: No Home free of loose throw rugs in walkways, pet beds, electrical cords, etc?: Yes Adequate lighting in your home to reduce risk of falls?: Yes Life alert?: Yes Use of a cane, walker or w/c?: Yes (walker) Grab bars in the bathroom?: Yes Shower chair or bench in shower?: Yes Elevated toilet seat or a handicapped toilet?: Yes  TIMED UP AND GO:  Was the test performed?  No  Cognitive Function: 6CIT completed        01/11/2024    2:34 PM 01/06/2023    9:14 AM 01/17/2022    10:48 AM  6CIT Screen  What Year? 0 points 0 points 0 points  What month? 0 points 0 points 0 points  What time? 0 points 0 points 0 points  Count back from 20 0 points 0 points 0 points  Months in reverse 0 points 0 points 0 points  Repeat phrase 0 points 0 points 0 points  Total Score 0 points 0 points 0 points    Immunizations Immunization History  Administered Date(s) Administered   Fluad Quad(high Dose 65+) 04/26/2019, 06/08/2020, 05/01/2021   Fluad Trivalent(High Dose 65+) 04/23/2023   Influenza Split 04/14/2011, 04/27/2013, 03/28/2014   Influenza Whole 05/28/2009, 04/27/2010, 03/31/2012   Influenza, High Dose Seasonal PF 04/10/2010, 04/11/2011, 04/19/2013, 03/21/2015, 04/05/2016, 05/16/2017, 03/28/2018, 04/08/2018   Influenza,inj,Quad PF,6+ Mos 03/17/2014, 04/04/2016   Influenza-Unspecified 04/30/2006, 04/28/2007, 04/28/2008, 05/29/2009, 04/10/2010, 05/26/2011, 05/07/2012, 05/28/2020, 05/01/2021, 04/27/2022   PFIZER Comirnaty(Gray Top)Covid-19 Tri-Sucrose Vaccine 11/29/2020, 05/01/2021   PFIZER(Purple Top)SARS-COV-2 Vaccination 08/19/2019, 09/09/2019, 03/15/2020, 04/08/2020, 12/09/2020   PNEUMOCOCCAL CONJUGATE-20 06/27/2022   Pfizer Covid-19 Vaccine Bivalent Booster 84yrs & up 05/01/2021   Pfizer(Comirnaty)Fall Seasonal Vaccine 12 years and older 05/03/2022, 04/23/2023   Pneumococcal Conjugate-13 09/20/2013, 03/29/2015   Pneumococcal Polysaccharide-23 05/28/2008, 04/25/2010, 04/19/2013   Pneumococcal-Unspecified 06/26/2006, 04/27/2013   Rsv, Bivalent, Protein Subunit Rsvpref,pf (Abrysvo) 07/30/2022   Td 11/23/2009   Tdap 07/17/2011, 07/30/2022   Zoster, Live 04/28/2009, 11/08/2009    Screening Tests Health Maintenance  Topic Date Due   Zoster Vaccines- Shingrix (1 of 2) 08/27/1959   Diabetic kidney evaluation - Urine ACR  10/02/2017   OPHTHALMOLOGY EXAM  10/06/2018   FOOT EXAM  08/16/2023   COVID-19 Vaccine (10 - Pfizer risk 2024-25 season) 10/21/2023   INFLUENZA  VACCINE  02/26/2024   HEMOGLOBIN A1C  06/05/2024   Diabetic kidney evaluation - eGFR measurement  12/14/2024   Medicare Annual Wellness (AWV)  01/10/2025   DTaP/Tdap/Td (4 - Td or Tdap) 07/30/2032   Pneumococcal Vaccine: 50+ Years  Completed   HPV VACCINES  Aged Out   Meningococcal B Vaccine  Aged Out   Hepatitis C Screening  Discontinued    Health Maintenance  Health Maintenance Due  Topic Date Due   Zoster Vaccines- Shingrix (1 of 2) [birth year]27/1959   Diabetic kidney evaluation - Urine ACR  10/02/2017   OPHTHALMOLOGY EXAM  10/06/2018   FOOT EXAM  08/16/2023   COVID-19 Vaccine (10 - Pfizer risk 2024-25 season) 10/21/2023   Health Maintenance Items Addressed:  Labs Ordered:  Diabetes Kidney evaluation - Urine ACR  Additional Screening:  Vision Screening: Recommended annual ophthalmology exams for early detection of glaucoma and other disorders of the eye. Would you like a referral to an eye doctor? No  Pt stated he's seen at the [facility] in [place], [place].  Dental Screening: Recommended annual dental exams for proper oral hygiene  Community Resource Referral / Chronic Care Management: CRR required this visit?  No   CCM required this visit?  No   Plan:    I have personally reviewed and noted the following in the patient's chart:   Medical and social history Use of alcohol , tobacco or illicit drugs  Current medications and supplements including opioid prescriptions. Patient is not currently taking opioid prescriptions. Functional ability and status Nutritional status Physical activity Advanced directives List of other physicians Hospitalizations, surgeries, and ER visits in previous 12 months Vitals Screenings to include cognitive, depression, and falls Referrals and appointments  In addition, I have reviewed and discussed with patient certain preventive protocols, quality metrics, and best practice recommendations. A written personalized care plan for preventive  services as well as general preventive health recommendations were provided to patient.   Patria Bookbinder, CMA   01/11/2024   After Visit Summary: (MyChart) Due to this being a telephonic visit, the after visit summary with patients personalized plan was offered to patient via MyChart   Notes: Nothing significant to report at this time.

## 2024-01-11 NOTE — Transitions of Care (Post Inpatient/ED Visit) (Signed)
 Transition of Care week # 6- TOC program case closure  Visit Note  01/11/2024  Name: [name] MRN: [id number]          DOB: 1941-03-31  Situation: Patient enrolled in [facility] 30-day program. Visit completed with patient by telephone.   HIPAA identifiers x 2 verified  Background:  Recent hospitalization May 8-13, 2025 for hemoptysis with hemothorax requiring chest tube placement Fragile state of health, multiple progressing chronic health conditions- COPD/ DMII; on home O2 at baseline, 2-3 L/min  TOC 30--day outreach completed; patient has successfully met/ accomplished her established goals for [facility] 30-day program without hospital readmission and referral was sent for ongoing follow up with longitudinal RN CM    Initial Transition Care Management Follow-up Telephone Call    Past Medical History:  Diagnosis Date   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   ablated   Anxiety    Aortic aneurysm (HCC) 09/02/2017   BENIGN PROSTATIC HYPERTROPHY 11/23/2009   Cardiomyopathy (HCC) 08/28/2016   Chronic systolic CHF (congestive heart failure) (HCC) 09/02/2017   COPD (chronic obstructive pulmonary disease) (HCC)    CORONARY ARTERY DISEASE 11/23/2009   DECREASED HEARING, 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   DEGENERATIVE JOINT DISEASE 11/23/2009   DEPRESSION 11/23/2009   FATIGUE 11/23/2009   GAIT DISTURBANCE 12/10/2009   HEMOPTYSIS UNSPECIFIED 05/07/2010   High cholesterol    HYPERTENSION 07/30/2009   HYPOTHYROIDISM 07/30/2009   Ischemic cardiomyopathy 09/02/2017   LUMBAR RADICULOPATHY, 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   On home oxygen  therapy    2-3L; 24/7 (09/10/2016)   OSA on CPAP    Pneumonia    PTSD (post-traumatic stress disorder) 03/10/2012   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  Using the oxygen , inhalers and nebulizer, and still pacing myself.  The antibiotics she gave me are almost completed.  Taking my medicine ike I am supposed to- have not needed to use the diuretic recently- my weights at home are holding steady at 191 lbs    Denies clinical concerns and sounds to be in no distress throughout TOC 30-day program outreach call today  Patient Reported Symptoms: Cognitive Cognitive Status: Alert and oriented to person, place, and time, Insightful and able to interpret abstract concepts, Normal speech and language skills      Neurological Neurological Review of Symptoms: No symptoms reported    HEENT HEENT Symptoms Reported: No symptoms reported      Cardiovascular Cardiovascular Symptoms Reported: Swelling in legs or feet Other Cardiovascular Symptoms: Reports weight today as 191 lbs; continues wearing compression stockings; reports swelling as same as it has been since I got out of the hospital; confirms has not used prn diuretic recently Does patient have uncontrolled Hypertension?: No Cardiovascular Conditions: Heart failure, Hypertension, Dysrhythmia Cardiovascular Management Strategies: Medication therapy, Routine screening, Diet modification, Coping strategies, Adequate rest, Fluid modification, Weight management Do You Have a Working Readable Scale?: Yes Weight: 191 lb (86.6 kg) (home reported wiehgt from 01/11/24) Cardiovascular Comment: Reinforced action plan for weight gain- continues to verbalize excellent understanding/ adherence  to same  Respiratory Respiratory Symptoms Reported: Shortness of breath Other Respiratory Symptoms: Confirmed patient report breathing is same as when I got home from hospital; it is my baseline- I am short of breath, pretty much all the  time, but know to stop and rest, use my inhalers and nebulizer/ oxygen ; Sounds to be in not distress throughout TOC call today Respiratory Conditions: COPD, Shortness of breath, Sleep disordered breathing  Endocrine Patient reports the following symptoms related to hypoglycemia or hyperglycemia : No symptoms reported Is patient diabetic?: Yes Is patient checking blood sugars at home?: Yes Endocrine Conditions: Diabetes, Thyroid  disorder Endocrine Management Strategies: Coping strategies, Routine screening, Medication therapy, Medical device, Weight management, Adequate rest, Diet modification  Gastrointestinal Gastrointestinal Symptoms Reported: No symptoms reported      Genitourinary Genitourinary Symptoms Reported: No symptoms reported    Integumentary Integumentary Symptoms Reported: No symptoms reported    Musculoskeletal Musculoskelatal Symptoms Reviewed: Difficulty walking, Unsteady gait, Weakness Additional Musculoskeletal Details: confirms continues to use walker as per baseline Musculoskeletal Conditions: Mobility limited, Other Other Musculoskeletal Conditions: LE PVD Musculoskeletal Management Strategies: Medication therapy, Routine screening, Diet modification, Coping strategies, Medical device      Psychosocial Psychosocial Symptoms Reported: No symptoms reported         There were no vitals filed for this visit.  Medications Reviewed Today     Reviewed by Marquest Gunkel M, RN (Registered Nurse) on 01/11/24 at 1011  Med List Status: <None>   Medication Order Taking? Sig Documenting Provider Last Dose Status Informant  acetaminophen  (TYLENOL ) 500 MG tablet [id number]  Take 1,000 mg by mouth every 6 (six) hours as needed for mild pain. [provider]  Active Multiple Informants, Self, Pharmacy Records, Other  albuterol  (PROVENTIL ) (2.5 MG/3ML) 0.083% nebulizer solution [id number]  Take 3 mLs (2.5 mg total) by nebulization every 6 (six) hours as needed for wheezing  or shortness of breath. Parrett, Macdonald Savoy, NP  Active   albuterol  (VENTOLIN  HFA) 108 (90 Base) MCG/ACT inhaler [id number]  Inhale 1-2 puffs into the lungs every 6 (six) hours as needed for wheezing or shortness of breath. Parrett, Macdonald Savoy, NP  Active   amoxicillin -clavulanate (AUGMENTIN ) 875-125 MG tablet [id number]  Take 1 tablet by mouth 2 (two) times daily. Parrett, Macdonald Savoy, NP  Active            Med Note Parke Boll, BARBARA B   Tue Jan 05, 2024 11:19 AM) New medication post pulmonary follow up.   amoxicillin -clavulanate (AUGMENTIN ) 875-125 MG tablet [id number]  Take 1 tablet by mouth 2 (two) times daily. Parrett, Macdonald Savoy, NP  Active   aspirin  81 MG chewable tablet [id number]  Chew 81 mg by mouth daily. 12/30/23: Patient reports during [facility] call: [facility] provider instructed to start taking on 12/23/23 (verified through review of EHR) in light of Plavix  continued to being held post- recent hospital discharge on 12/08/23 Adelia Homestead, MD  Active Self           Med Note (Bricia Taher M   Wed Dec 30, 2023 10:58 AM) 12/30/23: Patient reports during [facility] call: [facility] provider instructed to start taking on 12/23/23 (verified through review of EHR) in light of Plavix  continued to being held post- recent hospital discharge on 12/08/23   atorvastatin  (LIPITOR ) 40 MG tablet [id number]  Take 1 tablet (40 mg total) by mouth daily. Elmyra Haggard, MD  Active Multiple Informants, Self, Pharmacy Records, Other  Carboxymethylcellul-Glycerin [facility] EYE DROPS OP) [id number]  Place 1 drop into  both eyes 4 (four) times daily as needed (dry eyes).  [provider]  Active Multiple Informants, Self, Pharmacy Records, Other  carvedilol  (COREG ) 3.125 MG tablet [id number]  TAKE 1 TABLET(3.125 MG) BY MOUTH TWICE DAILY WITH A MEAL  Patient taking differently: Take 3.125 mg by mouth 2 (two) times daily with a meal.   Adelia Homestead, MD  Active Self, Pharmacy Records, Multiple Informants, Other  Cholecalciferol  (VITAMIN D3)  2000 units capsule [id number]  Take 2,000 Units by mouth daily.  [provider]  Active Multiple Informants, Self, Pharmacy Records, Other  cyanocobalamin  (VITAMIN B12) 500 MCG tablet [id number]  Take 500 mcg by mouth daily. [provider]  Active Self, Pharmacy Records, Multiple Informants, Other  empagliflozin  (JARDIANCE ) 10 MG TABS tablet [id number]  Take 1 tablet (10 mg total) by mouth daily before breakfast. Lenise Quince, MD  Active   ferrous sulfate  324 (65 Fe) MG TBEC [id number]  Take 324 mg by mouth daily. [provider]  Active Self, Pharmacy Records, Multiple Informants, Other  fluticasone  (FLONASE ) 50 MCG/ACT nasal spray [id number]  Place 1 spray into both nostrils daily as needed for allergies. Audria Leather, MD  Active   furosemide  (LASIX ) 20 MG tablet [id number]  Take 1 tablet (20 mg total) by mouth daily as needed. Parrett, Macdonald Savoy, NP  Active            Med Note (Nori Poland M   Wed Dec 30, 2023 10:59 AM) 12/30/23: Patient reports during [facility] call: not taking regularly; only as needed has not needed regularly per patient report   guaifenesin  (HUMIBID E) 400 MG TABS tablet [id number]  Take 400 mg by mouth in the morning and at bedtime. [provider]  Active Self, Pharmacy Records, Multiple Informants, Other  imiquimod (ALDARA) 5 % cream [id number]  Apply 1 Application topically 3 (three) times a week. APPLY TO WARTS 3-5 TIMES PER WEEK FOR 6 WEEKS OR UNTIL WARTS DISAPPEAR [provider]  Active Self, Pharmacy Records, Multiple Informants, Other  insulin  aspart (NOVOLOG ) 100 UNIT/ML injection [id number]  Inject 0-12 Units into the skin 3 (three) times daily before meals. [provider]  Active Self, Pharmacy Records, Multiple Informants, Other  insulin  glargine (LANTUS ) 100 UNIT/ML injection [id number]  Inject 20 Units into the skin daily. [provider]  Active Multiple Informants, Self, Pharmacy Records, Other   levothyroxine  (SYNTHROID ) 125 MCG tablet [id number]  Take 125 mcg by mouth daily before breakfast. [provider]  Active Self, Pharmacy Records, Multiple Informants, Other  losartan  (COZAAR ) 100 MG tablet [id number]  Take 1 tablet (100 mg total) by mouth daily. Lenise Quince, MD  Active   Multiple Vitamins-Minerals (MEGA MULTIVITAMIN FOR MEN PO) [id number]  Take 1 tablet by mouth daily. [provider]  Active Multiple Informants, Self, Pharmacy Records, Other  Olodaterol HCl 2.5 MCG/ACT AERS [id number]  Inhale 2 puffs into the lungs daily at 12 noon. [provider]  Active Self, Pharmacy Records, Multiple Informants, Other  OXYGEN  [id number]  Inhale 2 L into the lungs continuous. [provider]  Active Multiple Informants, Self, Pharmacy Records, Other           Med Note Parke Boll, BARBARA B   Tue Jan 05, 2024 11:21 AM) Oxygen  2-3L/[place]. Today was on 2.5L/ due to SOB.  predniSONE  (DELTASONE ) 10 MG tablet [id number]  Take 1 tablet (10 mg total) by mouth daily with breakfast. Cameron Cea, MD  Active Self, Pharmacy Records, Multiple Informants, Other  Med Note Baltazar Leventhal, Alethia Huxley   Fri Dec 04, 2023 10:28 AM) Dose was recently changed from 15 mg to 10 mg  Recent Dispenses 11/30/2023 10 MG TABS (disp 120, 60d supply) 10/30/2023 5 MG TABS (disp 180, 90d supply) 10/22/2023 20 MG TABS (disp 90, 90d supply) 09/22/2023 5 MG TABS (disp 90, 90d supply) 08/04/2023 20 MG TABS (disp 90, 90d supply)   pregabalin  (LYRICA ) 100 MG capsule [id number]  Take 100 mg by mouth daily. [provider]  Active Self, Pharmacy Records, Multiple Informants, Other  Semaglutide  (OZEMPIC , 1 MG/DOSE, [place]) [id number]  Inject 1 mg into the skin every Friday. [provider]  Active Multiple Informants, Self, Pharmacy Records, Other  sertraline  (ZOLOFT ) 100 MG tablet [id number]  Take 50 mg by mouth daily. [provider]  Active Self, Pharmacy Records, Multiple  Informants, Other  tamsulosin  (FLOMAX ) 0.4 MG CAPS capsule [id number]  Take 1 capsule (0.4 mg total) by mouth at bedtime. Jobe Mulder, DO  Active Multiple Informants, Self, Pharmacy Records, Other  Tiotropium Bromide -Olodaterol (STIOLTO RESPIMAT ) 2.5-2.5 MCG/ACT AERS [id number]  Inhale 2 puffs into the lungs daily. Parrett, Macdonald Savoy, NP  Active Multiple Informants, Self, Pharmacy Records, Other           Med Note (Yaileen Hofferber M   Thu Dec 10, 2023 11:16 AM) 12/10/23: Reports during TOC call he is no longer taking- reports using another inhaler the pulmonary doctor gave me for maintenance inhaler  traZODone  (DESYREL ) 50 MG tablet [id number]  Take 50 mg by mouth at bedtime. [provider]  Active Self, Pharmacy Records, Multiple Informants, Other  Med List Note Horace Lye 02/16/11 1841): Patient uses [facility] in [place] (med order)           Recommendation:   Continue Current Plan of Care  Follow Up Plan:   Referral to RN Case Manager  Pls call/ message for questions,  Erlene Hawks, RN, BSN, CCRN Alumnus RN Care Manager  Transitions of Care  [facility] [phone]: direct office

## 2024-01-11 NOTE — Patient Instructions (Addendum)
 Mr. [name] , Thank you for taking time out of your busy schedule to complete your Annual Wellness Visit with me. I enjoyed our conversation and look forward to speaking with you again next year. I, as well as your care team,  appreciate your ongoing commitment to your health goals. Please review the following plan we discussed and let me know if I can assist you in the future. Your Game plan/ To Do List    Follow up Visits: Next Medicare AWV with our clinical staff: 01/10/2025   Have you seen your provider in the last 6 months (3 months if uncontrolled diabetes)? No Next Office Visit with your provider: scheduled an appointment for the patient for 02/02/2024  Clinician Recommendations:  Aim for 30 minutes of exercise or brisk walking, 6-8 glasses of water, and 5 servings of fruits and vegetables each day. Educated and advised on getting the Shingles vaccines in 2025.        This is a list of the screening recommended for you and due dates:  Health Maintenance  Topic Date Due   Zoster (Shingles) Vaccine (1 of 2) 08/27/1960   Yearly kidney health urinalysis for diabetes  10/02/2017   Eye exam for diabetics  10/06/2018   Complete foot exam   08/16/2023   COVID-19 Vaccine (10 - Pfizer risk 2024-25 season) 10/21/2023   Flu Shot  02/26/2024   Hemoglobin A1C  06/05/2024   Yearly kidney function blood test for diabetes  12/14/2024   Medicare Annual Wellness Visit  01/10/2025   DTaP/Tdap/Td vaccine (4 - Td or Tdap) 07/30/2032   Pneumococcal Vaccine for age over 20  Completed   HPV Vaccine  Aged Out   Meningitis B Vaccine  Aged Out   Hepatitis C Screening  Discontinued    Advanced directives: (Provided) Advance directive discussed with you today. I have provided a copy for you to complete at home and have notarized. Once this is complete, please bring a copy in to our office so we can scan it into your chart.  Advance Care Planning is important because it:  [x]  Makes sure you receive the  medical care that is consistent with your values, goals, and preferences  [x]  It provides guidance to your family and loved ones and reduces their decisional burden about whether or not they are making the right decisions based on your wishes.  Follow the link provided in your after visit summary or read over the paperwork we have mailed to you to help you started getting your Advance Directives in place. If you need assistance in completing these, please reach out to us  so that we can help you!

## 2024-01-12 ENCOUNTER — Telehealth: Payer: Self-pay | Admitting: *Deleted

## 2024-01-12 NOTE — Progress Notes (Signed)
 Complex Care Management Note  Care Guide Note 01/12/2024 Name: [name] MRN: [id number] DOB: 07-11-1941  [name] is a 83 y.o. year old male who sees Adelia Homestead, MD for primary care. I reached out to [name] by phone today to offer complex care management services.  Mr. Mcduffee was given information about Complex Care Management services today including:   The Complex Care Management services include support from the care team which includes your Nurse Care Manager, Clinical Social Worker, or Pharmacist.  The Complex Care Management team is here to help remove barriers to the health concerns and goals most important to you. Complex Care Management services are voluntary, and the patient may decline or stop services at any time by request to their care team member.   Complex Care Management Consent Status: Patient agreed to services and verbal consent obtained.   Follow up plan:  Telephone appointment with complex care management team member scheduled for:  01/19/2024  Encounter Outcome:  Patient Scheduled  Kandis Ormond, CMA [place]  [facility], [facility] Guide Direct Dial: [phone]  Fax: [phone] Website: [email or web address]

## 2024-01-18 ENCOUNTER — Ambulatory Visit

## 2024-01-18 DIAGNOSIS — J432 Centrilobular emphysema: Secondary | ICD-10-CM

## 2024-01-18 DIAGNOSIS — I5022 Chronic systolic (congestive) heart failure: Secondary | ICD-10-CM

## 2024-01-18 DIAGNOSIS — R0609 Other forms of dyspnea: Secondary | ICD-10-CM

## 2024-01-18 NOTE — Telephone Encounter (Signed)
 Patient returned call to the office and I spoke with him.  He has been more sob for the past 3 days.  He is fine as long as he is still, he is sob with minimal movement and basic tasks.  He said he has no reserve.  He does not even feel like getting up and getting dressed.  He could not tell me what his oxygen  level was at the time of my call, however, he had turned his oxygen  up to 3L.  He is coughing some, but the mucous is clear.  His weight today was 201, yesterday it was 197, he states it is steadily going up.  He said he did not feel like he could go to [place] and was asking about medication that was discussed during his last OV.  I offered to make him an OV with someone else, however he decline  Spoke with Tammy, she states he was talking about the [place] .  She said this is not going to help at this time, she feels like he has something going on and he needs a cxr and an OV.  Lauraine has a 9 am, will call and offer him that with cxr prior.   Called and spoke with patient, advised that he needs a cxr and OV.  He stated he would come in, have the Cxr and see Lauraine.  I let him know that if he gets worse between now and then to go to the ED.  I let him know to be here at 8:45 am for a 9 am appointment with a cxr prior.  He verbalized understanding.

## 2024-01-18 NOTE — Telephone Encounter (Signed)
**Note De-identified  [place] Obfuscation** Please advise 

## 2024-01-18 NOTE — Telephone Encounter (Signed)
 Tried to call x 2 , no answer, LMTCB.  If he is getting that worse needs to be seen with chest xray-recent admission with Hemothorax.  May need to go to ER at [facility] for evaluation. I am in [place] tomorrow glad to work in for visit Please contact office for sooner follow up if symptoms do not improve or worsen or seek emergency care

## 2024-01-19 ENCOUNTER — Inpatient Hospital Stay (HOSPITAL_COMMUNITY)

## 2024-01-19 ENCOUNTER — Encounter (HOSPITAL_COMMUNITY): Payer: Self-pay | Admitting: Internal Medicine

## 2024-01-19 ENCOUNTER — Inpatient Hospital Stay (HOSPITAL_COMMUNITY)
Admission: EM | Admit: 2024-01-19 | Discharge: 2024-01-23 | DRG: 291 | Disposition: A | Discharge status: Home / Self Care | Attending: Internal Medicine | Admitting: Internal Medicine

## 2024-01-19 ENCOUNTER — Ambulatory Visit (INDEPENDENT_AMBULATORY_CARE_PROVIDER_SITE_OTHER): Admitting: Acute Care

## 2024-01-19 ENCOUNTER — Encounter: Payer: Self-pay | Admitting: Acute Care

## 2024-01-19 ENCOUNTER — Emergency Department (HOSPITAL_COMMUNITY)

## 2024-01-19 ENCOUNTER — Other Ambulatory Visit: Payer: Self-pay

## 2024-01-19 ENCOUNTER — Encounter (HOSPITAL_COMMUNITY): Payer: Self-pay

## 2024-01-19 ENCOUNTER — Ambulatory Visit (INDEPENDENT_AMBULATORY_CARE_PROVIDER_SITE_OTHER)

## 2024-01-19 VITALS — BP 151/73 | HR 66 | Ht 74.0 in | Wt 201.0 lb

## 2024-01-19 DIAGNOSIS — D693 Immune thrombocytopenic purpura: Secondary | ICD-10-CM | POA: Diagnosis present

## 2024-01-19 DIAGNOSIS — I480 Paroxysmal atrial fibrillation: Secondary | ICD-10-CM | POA: Diagnosis present

## 2024-01-19 DIAGNOSIS — I130 Hypertensive heart and chronic kidney disease with heart failure and stage 1 through stage 4 chronic kidney disease, or unspecified chronic kidney disease: Secondary | ICD-10-CM | POA: Diagnosis present

## 2024-01-19 DIAGNOSIS — J9621 Acute and chronic respiratory failure with hypoxia: Secondary | ICD-10-CM | POA: Diagnosis not present

## 2024-01-19 DIAGNOSIS — J9601 Acute respiratory failure with hypoxia: Secondary | ICD-10-CM

## 2024-01-19 DIAGNOSIS — Z79890 Hormone replacement therapy (postmenopausal): Secondary | ICD-10-CM

## 2024-01-19 DIAGNOSIS — Z7982 Long term (current) use of aspirin: Secondary | ICD-10-CM | POA: Diagnosis not present

## 2024-01-19 DIAGNOSIS — Z955 Presence of coronary angioplasty implant and graft: Secondary | ICD-10-CM

## 2024-01-19 DIAGNOSIS — Z7985 Long-term (current) use of injectable non-insulin antidiabetic drugs: Secondary | ICD-10-CM | POA: Diagnosis not present

## 2024-01-19 DIAGNOSIS — I5033 Acute on chronic diastolic (congestive) heart failure: Secondary | ICD-10-CM | POA: Diagnosis present

## 2024-01-19 DIAGNOSIS — Z7952 Long term (current) use of systemic steroids: Secondary | ICD-10-CM

## 2024-01-19 DIAGNOSIS — J929 Pleural plaque without asbestos: Secondary | ICD-10-CM | POA: Diagnosis not present

## 2024-01-19 DIAGNOSIS — R0609 Other forms of dyspnea: Secondary | ICD-10-CM

## 2024-01-19 DIAGNOSIS — Z7984 Long term (current) use of oral hypoglycemic drugs: Secondary | ICD-10-CM

## 2024-01-19 DIAGNOSIS — E1165 Type 2 diabetes mellitus with hyperglycemia: Secondary | ICD-10-CM | POA: Diagnosis present

## 2024-01-19 DIAGNOSIS — Z9842 Cataract extraction status, left eye: Secondary | ICD-10-CM

## 2024-01-19 DIAGNOSIS — R0602 Shortness of breath: Principal | ICD-10-CM

## 2024-01-19 DIAGNOSIS — J90 Pleural effusion, not elsewhere classified: Secondary | ICD-10-CM | POA: Diagnosis present

## 2024-01-19 DIAGNOSIS — Z79899 Other long term (current) drug therapy: Secondary | ICD-10-CM

## 2024-01-19 DIAGNOSIS — R64 Cachexia: Secondary | ICD-10-CM | POA: Diagnosis present

## 2024-01-19 DIAGNOSIS — Z8679 Personal history of other diseases of the circulatory system: Secondary | ICD-10-CM

## 2024-01-19 DIAGNOSIS — F32A Depression, unspecified: Secondary | ICD-10-CM | POA: Diagnosis present

## 2024-01-19 DIAGNOSIS — I5022 Chronic systolic (congestive) heart failure: Secondary | ICD-10-CM

## 2024-01-19 DIAGNOSIS — G4733 Obstructive sleep apnea (adult) (pediatric): Secondary | ICD-10-CM | POA: Diagnosis not present

## 2024-01-19 DIAGNOSIS — Z9981 Dependence on supplemental oxygen: Secondary | ICD-10-CM

## 2024-01-19 DIAGNOSIS — J432 Centrilobular emphysema: Secondary | ICD-10-CM

## 2024-01-19 DIAGNOSIS — J449 Chronic obstructive pulmonary disease, unspecified: Secondary | ICD-10-CM

## 2024-01-19 DIAGNOSIS — I255 Ischemic cardiomyopathy: Secondary | ICD-10-CM | POA: Diagnosis present

## 2024-01-19 DIAGNOSIS — Z87891 Personal history of nicotine dependence: Secondary | ICD-10-CM

## 2024-01-19 DIAGNOSIS — J9382 Other air leak: Secondary | ICD-10-CM | POA: Diagnosis not present

## 2024-01-19 DIAGNOSIS — H9192 Unspecified hearing loss, left ear: Secondary | ICD-10-CM | POA: Diagnosis present

## 2024-01-19 DIAGNOSIS — E7800 Pure hypercholesterolemia, unspecified: Secondary | ICD-10-CM | POA: Diagnosis present

## 2024-01-19 DIAGNOSIS — S2242XA Multiple fractures of ribs, left side, initial encounter for closed fracture: Secondary | ICD-10-CM | POA: Diagnosis not present

## 2024-01-19 DIAGNOSIS — N1831 Chronic kidney disease, stage 3a: Secondary | ICD-10-CM | POA: Diagnosis present

## 2024-01-19 DIAGNOSIS — Z794 Long term (current) use of insulin: Secondary | ICD-10-CM

## 2024-01-19 DIAGNOSIS — I2510 Atherosclerotic heart disease of native coronary artery without angina pectoris: Secondary | ICD-10-CM | POA: Diagnosis present

## 2024-01-19 DIAGNOSIS — E1122 Type 2 diabetes mellitus with diabetic chronic kidney disease: Secondary | ICD-10-CM | POA: Diagnosis present

## 2024-01-19 DIAGNOSIS — Z9862 Peripheral vascular angioplasty status: Secondary | ICD-10-CM

## 2024-01-19 DIAGNOSIS — N400 Enlarged prostate without lower urinary tract symptoms: Secondary | ICD-10-CM | POA: Diagnosis present

## 2024-01-19 DIAGNOSIS — R918 Other nonspecific abnormal finding of lung field: Secondary | ICD-10-CM | POA: Diagnosis not present

## 2024-01-19 DIAGNOSIS — Z9049 Acquired absence of other specified parts of digestive tract: Secondary | ICD-10-CM

## 2024-01-19 DIAGNOSIS — E039 Hypothyroidism, unspecified: Secondary | ICD-10-CM | POA: Diagnosis present

## 2024-01-19 DIAGNOSIS — Z4682 Encounter for fitting and adjustment of non-vascular catheter: Secondary | ICD-10-CM | POA: Diagnosis not present

## 2024-01-19 DIAGNOSIS — Z66 Do not resuscitate: Secondary | ICD-10-CM | POA: Diagnosis present

## 2024-01-19 DIAGNOSIS — Z961 Presence of intraocular lens: Secondary | ICD-10-CM | POA: Diagnosis present

## 2024-01-19 DIAGNOSIS — Z9841 Cataract extraction status, right eye: Secondary | ICD-10-CM

## 2024-01-19 DIAGNOSIS — M954 Acquired deformity of chest and rib: Secondary | ICD-10-CM | POA: Diagnosis present

## 2024-01-19 DIAGNOSIS — R54 Age-related physical debility: Secondary | ICD-10-CM | POA: Diagnosis present

## 2024-01-19 DIAGNOSIS — M069 Rheumatoid arthritis, unspecified: Secondary | ICD-10-CM | POA: Diagnosis present

## 2024-01-19 LAB — CBC WITH DIFFERENTIAL/PLATELET
Abs Immature Granulocytes: 0.03 10*3/uL (ref 0.00–0.07)
Basophils Absolute: 0.1 10*3/uL (ref 0.0–0.1)
Basophils Relative: 1 %
Eosinophils Absolute: 0.1 10*3/uL (ref 0.0–0.5)
Eosinophils Relative: 1 %
HCT: 42.1 % (ref 39.0–52.0)
Hemoglobin: 12.7 g/dL — ABNORMAL LOW (ref 13.0–17.0)
Immature Granulocytes: 0 %
Lymphocytes Relative: 8 %
Lymphs Abs: 0.7 10*3/uL (ref 0.7–4.0)
MCH: 27.3 pg (ref 26.0–34.0)
MCHC: 30.2 g/dL (ref 30.0–36.0)
MCV: 90.5 fL (ref 80.0–100.0)
Monocytes Absolute: 0.5 10*3/uL (ref 0.1–1.0)
Monocytes Relative: 5 %
Neutro Abs: 7.4 10*3/uL (ref 1.7–7.7)
Neutrophils Relative %: 85 %
Platelets: 184 10*3/uL (ref 150–400)
RBC: 4.65 MIL/uL (ref 4.22–5.81)
RDW: 15 % (ref 11.5–15.5)
WBC: 8.7 10*3/uL (ref 4.0–10.5)
nRBC: 0 % (ref 0.0–0.2)

## 2024-01-19 LAB — BODY FLUID CELL COUNT WITH DIFFERENTIAL
Eos, Fluid: 5 %
Lymphs, Fluid: 48 %
Monocyte-Macrophage-Serous Fluid: 26 % — ABNORMAL LOW (ref 50–90)
Neutrophil Count, Fluid: 21 % (ref 0–25)
Total Nucleated Cell Count, Fluid: 1460 uL — ABNORMAL HIGH (ref 0–1000)

## 2024-01-19 LAB — PROTEIN, PLEURAL OR PERITONEAL FLUID: Total protein, fluid: 4.6 g/dL

## 2024-01-19 LAB — BASIC METABOLIC PANEL WITH GFR
Anion gap: 13 (ref 5–15)
BUN: 22 mg/dL (ref 8–23)
CO2: 28 mmol/L (ref 22–32)
Calcium: 8.6 mg/dL — ABNORMAL LOW (ref 8.9–10.3)
Chloride: 96 mmol/L — ABNORMAL LOW (ref 98–111)
Creatinine, Ser: 1.46 mg/dL — ABNORMAL HIGH (ref 0.61–1.24)
GFR, Estimated: 48 mL/min — ABNORMAL LOW (ref >60)
Glucose, Bld: 287 mg/dL — ABNORMAL HIGH (ref 70–99)
Potassium: 4.7 mmol/L (ref 3.5–5.1)
Sodium: 137 mmol/L (ref 135–145)

## 2024-01-19 LAB — PROTEIN, TOTAL: Total Protein: 7.4 g/dL (ref 6.5–8.1)

## 2024-01-19 LAB — GLUCOSE, CAPILLARY
Glucose-Capillary: 188 mg/dL — ABNORMAL HIGH (ref 70–99)
Glucose-Capillary: 201 mg/dL — ABNORMAL HIGH (ref 70–99)

## 2024-01-19 LAB — LACTATE DEHYDROGENASE: LDH: 336 U/L — ABNORMAL HIGH (ref 98–192)

## 2024-01-19 LAB — LACTATE DEHYDROGENASE, PLEURAL OR PERITONEAL FLUID: LD, Fluid: 406 U/L — ABNORMAL HIGH (ref 3–23)

## 2024-01-19 LAB — BRAIN NATRIURETIC PEPTIDE: B Natriuretic Peptide: 285.1 pg/mL — ABNORMAL HIGH (ref 0.0–100.0)

## 2024-01-19 LAB — GLUCOSE, PLEURAL OR PERITONEAL FLUID: Glucose, Fluid: 234 mg/dL

## 2024-01-19 LAB — TROPONIN I (HIGH SENSITIVITY)
Troponin I (High Sensitivity): 25 ng/L — ABNORMAL HIGH (ref <18)
Troponin I (High Sensitivity): 21 ng/L — ABNORMAL HIGH (ref <18)

## 2024-01-19 MED ORDER — ACETAMINOPHEN 650 MG RE SUPP: 650.0000 mg | Freq: Four times a day (QID) | RECTAL | Status: DC | PRN

## 2024-01-19 MED ORDER — ARFORMOTEROL TARTRATE 15 MCG/2ML IN NEBU
15.0000 ug | INHALATION_SOLUTION | Freq: Two times a day (BID) | RESPIRATORY_TRACT | Status: DC
  Administered 2024-01-19 – 2024-01-23 (×8): 15 ug via RESPIRATORY_TRACT
  Filled 2024-01-19 (×8): qty 2

## 2024-01-19 MED ORDER — ACETAMINOPHEN 325 MG PO TABS
650.0000 mg | ORAL_TABLET | Freq: Four times a day (QID) | ORAL | Status: DC | PRN
Start: 2024-01-19 — End: 2024-01-23
  Administered 2024-01-21 – 2024-01-22 (×2): 650 mg via ORAL
  Filled 2024-01-19 (×3): qty 2

## 2024-01-19 MED ORDER — PREGABALIN 100 MG PO CAPS
100.0000 mg | ORAL_CAPSULE | Freq: Every day | ORAL | Status: DC
  Administered 2024-01-19 – 2024-01-22 (×4): 100 mg via ORAL
  Filled 2024-01-19 (×4): qty 1

## 2024-01-19 MED ORDER — ORAL CARE MOUTH RINSE: 15.0000 mL | OROMUCOSAL | Status: DC | PRN

## 2024-01-19 MED ORDER — SODIUM CHLORIDE 0.9% FLUSH
10.0000 mL | Freq: Three times a day (TID) | INTRAVENOUS | Status: DC
  Administered 2024-01-20 – 2024-01-21 (×3): 10 mL via INTRAPLEURAL

## 2024-01-19 MED ORDER — LEVOTHYROXINE SODIUM 125 MCG PO TABS
125.0000 ug | ORAL_TABLET | Freq: Every day | ORAL | Status: DC
  Administered 2024-01-20 – 2024-01-23 (×4): 125 ug via ORAL
  Filled 2024-01-19 (×4): qty 1

## 2024-01-19 MED ORDER — IOHEXOL 300 MG/ML  SOLN
75.0000 mL | Freq: Once | INTRAMUSCULAR | Status: AC | PRN
  Administered 2024-01-19: 75 mL via INTRAVENOUS

## 2024-01-19 MED ORDER — REVEFENACIN 175 MCG/3ML IN SOLN
175.0000 ug | Freq: Every day | RESPIRATORY_TRACT | Status: DC
  Administered 2024-01-20 – 2024-01-23 (×4): 175 ug via RESPIRATORY_TRACT
  Filled 2024-01-19 (×4): qty 3

## 2024-01-19 MED ORDER — TRAZODONE HCL 50 MG PO TABS
50.0000 mg | ORAL_TABLET | Freq: Every day | ORAL | Status: DC
  Administered 2024-01-19 – 2024-01-22 (×4): 50 mg via ORAL
  Filled 2024-01-19 (×2): qty 1

## 2024-01-19 MED ORDER — INSULIN ASPART 100 UNIT/ML IJ SOLN
0.0000 [IU] | Freq: Every day | INTRAMUSCULAR | Status: DC
  Administered 2024-01-19 – 2024-01-21 (×2): 2 [IU] via SUBCUTANEOUS
  Filled 2024-01-19: qty 0.05

## 2024-01-19 MED ORDER — SODIUM CHLORIDE (PF) 0.9 % IJ SOLN
INTRAMUSCULAR | Status: AC
  Filled 2024-01-19: qty 50

## 2024-01-19 MED ORDER — FUROSEMIDE 10 MG/ML IJ SOLN
60.0000 mg | Freq: Once | INTRAMUSCULAR | Status: AC
  Administered 2024-01-19: 60 mg via INTRAVENOUS
  Filled 2024-01-19: qty 8

## 2024-01-19 MED ORDER — TRAZODONE HCL 50 MG PO TABS
25.0000 mg | ORAL_TABLET | Freq: Every evening | ORAL | Status: DC | PRN
  Administered 2024-01-19: 25 mg via ORAL
  Filled 2024-01-19: qty 1

## 2024-01-19 MED ORDER — INSULIN ASPART 100 UNIT/ML IJ SOLN
0.0000 [IU] | Freq: Three times a day (TID) | INTRAMUSCULAR | Status: DC
  Administered 2024-01-19 – 2024-01-20 (×2): 3 [IU] via SUBCUTANEOUS
  Administered 2024-01-20 (×2): 5 [IU] via SUBCUTANEOUS
  Administered 2024-01-21: 3 [IU] via SUBCUTANEOUS
  Administered 2024-01-21: 8 [IU] via SUBCUTANEOUS
  Administered 2024-01-21: 3 [IU] via SUBCUTANEOUS
  Administered 2024-01-22 (×2): 8 [IU] via SUBCUTANEOUS
  Administered 2024-01-22: 2 [IU] via SUBCUTANEOUS
  Administered 2024-01-23: 8 [IU] via SUBCUTANEOUS
  Administered 2024-01-23: 2 [IU] via SUBCUTANEOUS
  Filled 2024-01-19: qty 0.15

## 2024-01-19 MED ORDER — HYDRALAZINE HCL 20 MG/ML IJ SOLN
5.0000 mg | Freq: Four times a day (QID) | INTRAMUSCULAR | Status: DC | PRN
  Administered 2024-01-19: 5 mg via INTRAVENOUS
  Filled 2024-01-19: qty 1

## 2024-01-19 MED ORDER — ALBUTEROL SULFATE (2.5 MG/3ML) 0.083% IN NEBU: 2.5000 mg | INHALATION_SOLUTION | RESPIRATORY_TRACT | Status: DC | PRN

## 2024-01-19 MED ORDER — LOSARTAN POTASSIUM 50 MG PO TABS
100.0000 mg | ORAL_TABLET | Freq: Every day | ORAL | Status: DC
  Administered 2024-01-20 – 2024-01-23 (×4): 100 mg via ORAL
  Filled 2024-01-19 (×4): qty 2

## 2024-01-19 NOTE — Patient Instructions (Addendum)
 It is good to see you today.  Your chest x ray shows fluid and or infiltrates  in your left and right lung. You need to go to the hospital, where they can drain the fluid id needed, and treat the cause. I am also concerned there may be an issue with your heart, as your weight has increased and you have new swelling in your ankles.  It is also concerning, you are not having to void more on your lasix . You have chosen to have your daughter drive you to the ER, which is what we recommend for care. Wear your oxygen  at 4 L as you have been doing until you get to the ED.  You will follow up here after your hospital visit with Madelin Stank NP. Call us  of you need us .  Please contact office for sooner follow up if symptoms do not improve or worsen or seek emergency care

## 2024-01-19 NOTE — Progress Notes (Signed)
 Took pt off bipap for a break. Placed on NRB. Bipap on standby

## 2024-01-19 NOTE — ED Provider Notes (Signed)
 [place] EMERGENCY DEPARTMENT AT [facility] Provider Note  CSN: [id number] Arrival date & time: 01/19/24 9051  Chief Complaint(s) Shortness of Breath  HPI [name] is a 83 y.o. male here today with shortness of breath.  Patient had an appointment with his pulmonologist today.  At baseline, patient wears 2 L nasal cannula is had increased to 4 L.  He has also endorsed weight gain.   Past Medical History Past Medical History:  Diagnosis Date   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   ablated   Anxiety    Aortic aneurysm (HCC) 09/02/2017   BENIGN PROSTATIC HYPERTROPHY 11/23/2009   Cardiomyopathy (HCC) 08/28/2016   Chronic systolic CHF (congestive heart failure) (HCC) 09/02/2017   COPD (chronic obstructive pulmonary disease) (HCC)    CORONARY ARTERY DISEASE 11/23/2009   DECREASED HEARING, 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   DEGENERATIVE JOINT DISEASE 11/23/2009   DEPRESSION 11/23/2009   FATIGUE 11/23/2009   GAIT DISTURBANCE 12/10/2009   HEMOPTYSIS UNSPECIFIED 05/07/2010   High cholesterol    HYPERTENSION 07/30/2009   HYPOTHYROIDISM 07/30/2009   Ischemic cardiomyopathy 09/02/2017   LUMBAR RADICULOPATHY, 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   On home oxygen  therapy    2-3L; 24/7 (09/10/2016)   OSA on CPAP    Pneumonia    PTSD (post-traumatic stress disorder) 03/10/2012   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   RA (rheumatoid arthritis) (HCC) 06/11/2011   qwhere (09/10/2016)   RESPIRATORY FAILURE, CHRONIC 07/31/2009   Scleritis of both eyes 03/17/2014   Thrombocytopenia (HCC)    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   Type II diabetes mellitus (HCC)    Patient Active Problem List   Diagnosis Date Noted   CAP (community acquired pneumonia) 01/08/2024   Hemothorax, traumatic, subsequent encounter 12/29/2023   Prostate cancer screening 12/15/2023   Hemothorax 12/08/2023   Need for management of chest tube 12/07/2023   Hemothorax on left 12/07/2023   Acute respiratory failure with hypoxia (HCC) 12/07/2023   Malnutrition of moderate  degree 12/06/2023   Pleural effusion 12/04/2023   QT prolongation 12/03/2023   Rib fractures 10/27/2022   Allergic rhinitis 10/27/2022   Coronary artery disease involving coronary bypass graft of native heart with angina pectoris (HCC) 08/15/2022   S/P femoral-tibial bypass 03/04/2022   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   Oxygen  dependent 03/05/2021   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   Obesity 12/11/2020   Osteoarthritis 12/11/2020   Other long term (current) drug therapy 12/11/2020   Thrombocytopenia (HCC) 12/11/2020   Encounter for general adult medical examination with abnormal findings 11/05/2020   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   Claudication (HCC) 07/03/2020   Physical deconditioning 06/08/2020   Hemoptysis 02/16/2020   Syncope 10/04/2019   LBBB (left bundle branch block) 10/04/2019   Pacemaker 10/04/2019   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   Lung nodules 03/25/2019   AKI (acute kidney injury) (HCC) 02/12/2018   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   Symptomatic anemia 01/20/2018   BENIGN PROSTATIC HYPERTROPHY 12/2017   Chronic respiratory failure with hypoxia (HCC) 12/01/2017   Lung mass 12/01/2017   PAF (paroxysmal atrial fibrillation) (HCC)    Ischemic cardiomyopathy 09/02/2017   Chronic systolic CHF (congestive heart failure) (HCC) 09/02/2017   Aortic aneurysm (HCC) 09/02/2017   Diabetes mellitus with complication, with long-term current use of insulin  (HCC) 09/01/2017   Anxiety and depression 09/01/2017   COPD (chronic obstructive pulmonary disease) (HCC) 09/01/2017   CORONARY ARTERY DISEASE 08/28/2016   Myogenic ptosis of right eyelid 07/11/2016  Open angle with borderline findings, low risk, bilateral 07/11/2016   Bilateral dry eyes 04/14/2014   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/14/2014   Hyperlipidemia associated with type 2 diabetes mellitus (HCC)  01/05/2014   High risk medication use 10/07/2013   Pseudophakia of both eyes 10/07/2013   Drusen of left macula 12/01/2012   Scleritis of both eyes 12/01/2012   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 Authorizing Provider  acetaminophen  (TYLENOL ) 500 MG tablet Take 1,000 mg by mouth every 6 (six) hours as needed for mild pain.    [provider]  albuterol  (PROVENTIL ) (2.5 MG/3ML) 0.083% nebulizer solution Take 3 mLs (2.5 mg total) by nebulization every 6 (six) hours as needed for wheezing or shortness of breath. 12/29/23   Parrett, Madelin RAMAN, NP  albuterol  (VENTOLIN  HFA) 108 (90 Base) MCG/ACT inhaler Inhale 1-2 puffs into the lungs every 6 (six) hours as needed for wheezing or shortness of breath. 01/07/24   Parrett, Madelin RAMAN, NP  amoxicillin -clavulanate (AUGMENTIN ) 875-125 MG tablet Take 1 tablet by mouth 2 (two) times daily. 12/31/23   Parrett, Madelin RAMAN, NP  amoxicillin -clavulanate (AUGMENTIN ) 875-125 MG tablet Take 1 tablet by mouth 2 (two) times daily. 01/07/24   Parrett, Madelin RAMAN, NP  aspirin  81 MG chewable tablet Chew 81 mg by mouth daily. 12/30/23: Patient reports during [facility] call: [facility] provider instructed to start taking on 12/23/23 (verified through review of EHR) in light of Plavix  continued to being held post- recent hospital discharge on 12/08/23    Rollene Almarie LABOR, MD  atorvastatin  (LIPITOR ) 40 MG tablet Take 1 tablet (40 mg total) by mouth daily. 10/21/16   Okey Vina GAILS, MD  Carboxymethylcellul-Glycerin (LUBRICATING EYE DROPS OP) Place 1  drop into both eyes 4 (four) times daily as needed (dry eyes).     [provider]  carvedilol  (COREG ) 3.125 MG tablet TAKE 1 TABLET(3.125 MG) BY MOUTH TWICE DAILY WITH A MEAL 07/20/23   Rollene Almarie LABOR, MD  Cholecalciferol  (VITAMIN D3) 2000 units capsule Take 2,000 Units by mouth daily.  01/11/16   [provider]  cyanocobalamin  (VITAMIN B12) 500 MCG tablet Take 500 mcg by mouth daily. 07/30/22   [provider]  empagliflozin  (JARDIANCE ) 10 MG TABS tablet Take 1 tablet (10 mg total) by mouth daily before breakfast. 12/23/23   Pietro Redell RAMAN, MD  ferrous sulfate  324 (65 Fe) MG TBEC Take 324 mg by mouth daily. 10/02/21   [provider]  fluticasone  (FLONASE ) 50 MCG/ACT nasal spray Place 1 spray into both nostrils daily as needed for allergies. 12/09/23   Cheryle Page, MD  furosemide  (LASIX ) 20 MG tablet Take 1 tablet (20 mg total) by mouth daily as needed. 12/29/23   Parrett, Madelin RAMAN, NP  guaifenesin  (HUMIBID E) 400 MG TABS tablet Take 400 mg by mouth in the morning and at bedtime. 05/23/23   [provider]  imiquimod (ALDARA) 5 % cream Apply 1 Application topically 3 (three) times a week. APPLY TO WARTS 3-5 TIMES PER WEEK FOR 6 WEEKS OR UNTIL WARTS DISAPPEAR 12/08/22   [provider]  insulin  aspart (NOVOLOG ) 100 UNIT/ML injection Inject 0-12 Units into the skin 3 (three) times daily before meals.    [provider]  insulin  glargine (LANTUS ) 100 UNIT/ML injection Inject 20 Units into the skin daily.    [provider]  levothyroxine  (SYNTHROID ) 125 MCG tablet Take 125 mcg by mouth daily before breakfast. 12/07/22   [provider]  losartan  (COZAAR ) 100 MG tablet Take 1 tablet (100 mg total) by mouth daily. 12/23/23 03/22/24  Pietro Redell RAMAN, MD  Multiple Vitamins-Minerals (MEGA MULTIVITAMIN FOR MEN PO) Take 1 tablet by mouth daily.    [provider]  Olodaterol HCl 2.5 MCG/ACT AERS Inhale 2 puffs into the  lungs daily at 12 noon.    [provider]  OXYGEN  Inhale 2 L into the lungs continuous.    [provider]  predniSONE  (DELTASONE ) 10 MG tablet Take 1 tablet (10 mg total) by mouth daily with breakfast. 12/01/23   Odean Potts, MD  pregabalin  (LYRICA ) 100 MG capsule Take 100 mg by mouth daily.    [provider]  Semaglutide  (OZEMPIC , 1 MG/DOSE, [place]) Inject 1 mg into the skin every Friday.    [provider]  sertraline  (ZOLOFT ) 100 MG tablet Take 50 mg by mouth daily. 11/06/22   [provider]  tamsulosin  (FLOMAX ) 0.4 MG CAPS capsule Take 1 capsule (0.4 mg total) by mouth at bedtime. 09/14/17   Frann Mabel Mt, DO  Tiotropium Bromide -Olodaterol (STIOLTO RESPIMAT ) 2.5-2.5 MCG/ACT AERS Inhale 2 puffs into the lungs daily. 03/01/21   Parrett, Madelin RAMAN, NP  traZODone  (DESYREL ) 50 MG tablet Take 50 mg by mouth at bedtime. 11/06/22   [provider]                                                                                                                                    Past Surgical History Past Surgical History:  Procedure Laterality Date   ABDOMINAL AORTIC ANEURYSM REPAIR  07/2002   thelbert 12/10/2010   ABDOMINAL AORTOGRAM W/LOWER EXTREMITY N/A 08/16/2020   Procedure: ABDOMINAL AORTOGRAM W/LOWER EXTREMITY;  Surgeon: Gretta Lonni PARAS, MD;  Location: [facility];  Service: Cardiovascular;  Laterality: N/A;   ABDOMINAL AORTOGRAM W/LOWER EXTREMITY N/A 10/24/2021   Procedure: ABDOMINAL AORTOGRAM W/LOWER EXTREMITY;  Surgeon: Gretta Lonni PARAS, MD;  Location: [facility];  Service: Cardiovascular;  Laterality: N/A;   ABDOMINAL AORTOGRAM W/LOWER EXTREMITY N/A 03/13/2022   Procedure: ABDOMINAL AORTOGRAM W/LOWER EXTREMITY;  Surgeon: Gretta Lonni PARAS, MD;  Location: [facility];  Service: Cardiovascular;  Laterality: N/A;   ABDOMINAL EXPLORATION SURGERY  02/2004   w/LOA/notes 12/10/2010; small bowel obstruction repair  with adhesiolysis    BACK SURGERY     CARDIAC CATHETERIZATION     2 heart caths in the past.  One in 2000s showed one ulcerated plaque  Rx medically; Second at [facility] /notes 09/05/2016   CATARACT EXTRACTION W/  INTRAOCULAR LENS  IMPLANT, BILATERAL Bilateral 2000s   COLECTOMY     hx of remote ileum resection due to bleeding   COLONOSCOPY WITH PROPOFOL  N/A 01/22/2018   Procedure: COLONOSCOPY WITH PROPOFOL ;  Surgeon: Avram Lupita BRAVO, MD;  Location: [facility];  Service: Endoscopy;  Laterality: N/A;   CORONARY ANGIOPLASTY WITH STENT PLACEMENT  09/10/2016   CORONARY STENT INTERVENTION N/A 09/10/2016   Procedure: Coronary Stent Intervention;  Surgeon: Lonni JONETTA Cash, MD;  Location: [facility] CV [facility];  Service: Cardiovascular;  Laterality: N/A;  Distal RCA 4.0x16 Synergy   ESOPHAGOGASTRODUODENOSCOPY (EGD) WITH PROPOFOL  N/A 01/22/2018   Procedure: ESOPHAGOGASTRODUODENOSCOPY (EGD) WITH PROPOFOL ;  Surgeon: Avram Lupita BRAVO, MD;  Location: [facility];  Service: Endoscopy;  Laterality: N/A;   EYE SURGERY     FALSE ANEURYSM REPAIR Left 08/22/2020   Procedure: EXCLUSION OF LEFT POPLITEAL ARTERY ANEURYSM;  Surgeon: Gretta Lonni PARAS, MD;  Location: [facility] OR;  Service: Vascular;  Laterality: Left;   FEMORAL EMBOLOECTOMY Left 07/2000   with left leg ischemia; Dr. Gerlean, vascular   FEMORAL-POPLITEAL BYPASS GRAFT Bilateral 08/22/2020   Procedure: LEFT FEMORAL-POSTERIOR TIBIAL ARTERY BYPASS GRAFT USING RIGHT GREATER NONREVERSED SAPHENOUS VEIN GRAFT;  Surgeon: Gretta Lonni PARAS, MD;  Location: [facility] OR;  Service: Vascular;  Laterality: Bilateral;   GANGLION CYST EXCISION Right    wrist; Dr. Leonor   HOT HEMOSTASIS N/A 01/22/2018   Procedure: HOT HEMOSTASIS (ARGON PLASMA COAGULATION/BICAP);  Surgeon: Avram Lupita BRAVO, MD;  Location: [facility];  Service: Endoscopy;  Laterality: N/A;   LOOP RECORDER INSERTION N/A 04/25/2019   Procedure: LOOP RECORDER INSERTION;  Surgeon: Fernande Elspeth BROCKS, MD;  Location: [facility]  INVASIVE CV LAB;  Service: Cardiovascular;  Laterality: N/A;   LOOP RECORDER REMOVAL N/A 07/01/2019   Procedure: LOOP RECORDER REMOVAL;  Surgeon: Fernande Elspeth BROCKS, MD;  Location: [facility] INVASIVE CV LAB;  Service: Cardiovascular;  Laterality: N/A;   LUMBAR LAMINECTOMY  1972   Dr. Beverley   PACEMAKER IMPLANT N/A 07/01/2019   Procedure: PACEMAKER IMPLANT;  Surgeon: Fernande Elspeth BROCKS, MD;  Location: [facility] INVASIVE CV LAB;  Service: Cardiovascular;  Laterality: N/A;   PERIPHERAL VASCULAR BALLOON ANGIOPLASTY Left 10/24/2021   Procedure: PERIPHERAL VASCULAR BALLOON ANGIOPLASTY;  Surgeon: Gretta Lonni PARAS, MD;  Location: [facility];  Service: Cardiovascular;  Laterality: Left;   RIGHT/LEFT HEART CATH AND CORONARY ANGIOGRAPHY N/A 09/10/2016   Procedure: Right/Left Heart Cath and Coronary Angiography;  Surgeon: Lonni JONETTA Cash, MD;  Location: [facility] INVASIVE CV LAB;  Service: Cardiovascular;  Laterality: N/A;   TONSILLECTOMY     Family History Family History  Adopted: Yes  Problem Relation Age of Onset   Other Mother        gun shot    Social History Social History   Tobacco Use   Smoking status: Former    Current packs/day: 0.00    Average packs/day: 2.5 packs/day for 40.0 years (100.0 ttl pk-yrs)    Types: Cigarettes, Pipe, Cigars    Start date: 07/28/1958    Quit date: 07/28/1998    Years since quitting: 25.4    Passive exposure: Never   Smokeless tobacco: Never  Vaping Use   Vaping status: Never Used  Substance Use Topics   Alcohol  use: No    Alcohol /week: 0.0 standard drinks of alcohol    Drug use: No   Allergies Patient has no known allergies.  Review of Systems Review of Systems  Physical Exam Vital Signs  I have reviewed the triage vital signs BP ROLLEN)  129/113   Pulse 69   Temp 97.6 F (36.4 C) (Axillary)   Resp 20   Ht 6' 2 (1.88 m)   Wt 91.2 kg   SpO2 96%   BMI 25.81 kg/m   Physical Exam Vitals and nursing note reviewed.  Constitutional:      Appearance: He is  not toxic-appearing.  HENT:     Head: Normocephalic.   Eyes:     Pupils: Pupils are equal, round, and reactive to light.   Pulmonary:     Effort: Tachypnea present.     Breath sounds: Examination of the left-lower field reveals decreased breath sounds. Decreased breath sounds present. No wheezing or rales.  Chest:     Chest wall: No mass.  Abdominal:     Palpations: Abdomen is soft.   Neurological:     General: No focal deficit present.     Mental Status: He is alert.     ED Results and Treatments Labs (all labs ordered are listed, but only abnormal results are displayed) Labs Reviewed  BASIC METABOLIC PANEL WITH GFR - Abnormal; Notable for the following components:      Result Value   Chloride 96 (*)    Glucose, Bld 287 (*)    Creatinine, Ser 1.46 (*)    Calcium  8.6 (*)    GFR, Estimated 48 (*)    All other components within normal limits  CBC WITH DIFFERENTIAL/PLATELET - Abnormal; Notable for the following components:   Hemoglobin 12.7 (*)    All other components within normal limits  BRAIN NATRIURETIC PEPTIDE - Abnormal; Notable for the following components:   B Natriuretic Peptide 285.1 (*)    All other components within normal limits  TROPONIN I (HIGH SENSITIVITY) - Abnormal; Notable for the following components:   Troponin I (High Sensitivity) 25 (*)    All other components within normal limits  TROPONIN I (HIGH SENSITIVITY) - Abnormal; Notable for the following components:   Troponin I (High Sensitivity) 21 (*)    All other components within normal limits                                                                                                                          Radiology CT Chest W Contrast Result Date: 01/19/2024 CLINICAL DATA:  Pneumonia suspected.  * Tracking Code: BO * EXAM: CT CHEST WITH CONTRAST TECHNIQUE: Multidetector CT imaging of the chest was performed during intravenous contrast administration. RADIATION DOSE REDUCTION: This exam was  performed according to the departmental dose-optimization program which includes automated exposure control, adjustment of the mA and/or kV according to patient size and/or use of iterative reconstruction technique. CONTRAST:  75mL OMNIPAQUE  IOHEXOL  300 MG/ML  SOLN COMPARISON:  Plain film of earlier today. Most recent chest CT 12/03/2023. FINDINGS: Cardiovascular: Pacer. Aneurysm off the inferior transverse aorta is similar at 3.9 cm on 15/2. No surrounding hemorrhage. Tortuous thoracic aorta. Normal heart size, without pericardial effusion. Three vessel coronary artery calcification. No central  pulmonary embolism, on this non-dedicated study. Pulmonary artery enlargement, outflow tract 3.8 cm. Mediastinum/Nodes: No mediastinal or hilar adenopathy. Lungs/Pleura: The left apical heterogeneous soft tissue density has been drained in the interval and most likely hematoma. Right-sided pleural calcified plaques. Irregular left-sided complex left pleural thickening, especially superiorly. Increased moderate volume left-sided pleural fluid inferiorly. Left-sided subpleural nodularity/adenopathy in the juxta cardiac space including at 1.9 cm on 47/2, 1.5 cm on the prior. No right-sided pleural fluid. Advanced centrilobular emphysema. Improved left apical aeration with residual scarring and volume loss. Worsened left lower lobe collapse/consolidation. Upper Abdomen: Numerous tiny gallstones. Normal imaged portions of the spleen, stomach, pancreas, adrenal glands. Upper pole left-sided caliectasis on 71/2. Aortic dilatation at the level of the left renal artery origin at 3.8 cm is similar. Musculoskeletal: Old left rib fractures. Minimally displaced posterior seventh left rib fracture. Mild compression deformity at T12 with minimal ventral canal encroachment, similar. IMPRESSION: 1. 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 Subpleural/left  cardiophrenic angle bulky adenopathy, progressive. 2. Increased left pleural fluid with left lower lobe collapse/consolidation. Atelectasis and/or infection. 3. No convincing evidence of primary malignancy within the lungs. 4. Possible upper pole left-sided caliectasis. Correlate with abdominal symptoms and possibly dedicated imaging. 5. 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 6. Right-sided pleural calcified plaques, likely result of remote empyema or hemothorax. 7. Pulmonary artery enlargement suggests pulmonary arterial hypertension. 8. Incidental findings, including: Aortic atherosclerosis (ICD10-I70.0), coronary artery atherosclerosis and emphysema (ICD10-J43.9). Pulmonary artery enlargement suggests pulmonary arterial hypertension. Cholelithiasis. Electronically Signed   By: Rockey Kilts M.D.   On: 01/19/2024 13:44   DG Chest 2 View Result Date: 01/19/2024 CLINICAL DATA:  Shortness of breath, weight gain. EXAM: CHEST - 2 VIEW COMPARISON:  January 07, 2024. FINDINGS: Stable cardiomediastinal silhouette. Left-sided pacemaker is unchanged. Extensive right pleural calcifications are noted suggesting asbestos exposure. Small bilateral pleural effusions are noted. Bilateral lung opacities are noted concerning for infiltrates or atelectasis. Bony thorax is unremarkable. IMPRESSION: Stable right-sided pleural plaques. Mildly increased bibasilar lung opacities are noted concerning for infiltrates or atelectasis. Small pleural effusions. Electronically Signed   By: Lynwood Landy Raddle M.D.   On: 01/19/2024 09:56    Pertinent labs & imaging results that were available during my care of the patient were reviewed by me and considered in my medical decision making (see MDM for details).  Medications Ordered in ED Medications  furosemide  (LASIX ) injection 60 mg (60 mg Intravenous Given 01/19/24 1017)  iohexol  (OMNIPAQUE ) 300 MG/ML solution 75 mL (75 mLs Intravenous Contrast Given 01/19/24 1201)                                                                                                                                      Procedures .Critical Care  Performed by: Mannie Fairy DASEN, DO Authorized by: Mannie Fairy DASEN, DO   Critical care provider statement:  Critical care time (minutes):  78   Critical care was time spent personally by me on the following activities:  Development of treatment plan with patient or surrogate, discussions with consultants, evaluation of patient's response to treatment, examination of patient, ordering and review of laboratory studies, ordering and review of radiographic studies, ordering and performing treatments and interventions, pulse oximetry, re-evaluation of patient's condition and review of old charts   (including critical care time)  Medical Decision Making / ED Course   This patient presents to the ED for concern of shortness of breath, this involves an extensive number of treatment options, and is a complaint that carries with it a high risk of complications and morbidity.  The differential diagnosis includes pleural effusion, hemothorax, pneumonia, less likely PE.  MDM: Patient tachypneic, hypoxic here in the ED.  Placed on BiPAP with improvement in his work of breathing.  Will begin to diurese the patient.  Will obtain CT imaging of the patient's chest.  Patient had a traumatic left-sided hemothorax on the left side in May.  Will interrogate the patient's pacemaker.  Reassessment 2 PM-patient quite a bit more comfortable on BiPAP.  He has responded well with diuresing.  His CT scan of his chest shows increased pleural effusion and consolidation in that left lobe, concerns for pleural malignancy such as mesothelioma.  Patient is not requiring more emergent airway intervention here in the ED.  Believe he is appropriate for stepdown unit.  Will admit to hospitalist service.   Additional history obtained: -Additional history obtained from daughter at  bedside -External records from outside source obtained and reviewed including: Chart review including previous notes, labs, imaging, consultation notes   Lab Tests: -I ordered, reviewed, and interpreted labs.   The pertinent results include:   Labs Reviewed  BASIC METABOLIC PANEL WITH GFR - Abnormal; Notable for the following components:      Result Value   Chloride 96 (*)    Glucose, Bld 287 (*)    Creatinine, Ser 1.46 (*)    Calcium  8.6 (*)    GFR, Estimated 48 (*)    All other components within normal limits  CBC WITH DIFFERENTIAL/PLATELET - Abnormal; Notable for the following components:   Hemoglobin 12.7 (*)    All other components within normal limits  BRAIN NATRIURETIC PEPTIDE - Abnormal; Notable for the following components:   B Natriuretic Peptide 285.1 (*)    All other components within normal limits  TROPONIN I (HIGH SENSITIVITY) - Abnormal; Notable for the following components:   Troponin I (High Sensitivity) 25 (*)    All other components within normal limits  TROPONIN I (HIGH SENSITIVITY) - Abnormal; Notable for the following components:   Troponin I (High Sensitivity) 21 (*)    All other components within normal limits      EKG paced rhythm  EKG Interpretation Date/Time:  Tuesday January 19 2024 10:11:38 EDT Ventricular Rate:  68 PR Interval:  256 QRS Duration:  183 QT Interval:  485 QTC Calculation: 516 R Axis:   -55  Text Interpretation: Unknown rhythm, irregular rate Prolonged PR interval Nonspecific IVCD with LAD LVH with secondary repolarization abnormality Inferior infarct, old Confirmed by Mannie Pac [phone]) on 01/19/2024 11:29:17 AM         Imaging Studies ordered: I ordered imaging studies including CT imaging the chest I independently visualized and interpreted imaging. I agree with the radiologist interpretation   Medicines ordered and prescription drug management: Meds ordered this encounter  Medications  furosemide  (LASIX )  injection 60 mg   iohexol  (OMNIPAQUE ) 300 MG/ML solution 75 mL    -I have reviewed the patients home medicines and have made adjustments as needed  Critical interventions Management of acute hypoxic respiratory distress  Cardiac Monitoring: The patient was maintained on a cardiac monitor.  I personally viewed and interpreted the cardiac monitored which showed an underlying rhythm of: Sinus paced rhythm     Reevaluation: After the interventions noted above, I reevaluated the patient and found that they have :improved  Co morbidities that complicate the patient evaluation  Past Medical History:  Diagnosis Date   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   ablated   Anxiety    Aortic aneurysm (HCC) 09/02/2017   BENIGN PROSTATIC HYPERTROPHY 11/23/2009   Cardiomyopathy (HCC) 08/28/2016   Chronic systolic CHF (congestive heart failure) (HCC) 09/02/2017   COPD (chronic obstructive pulmonary disease) (HCC)    CORONARY ARTERY DISEASE 11/23/2009   DECREASED HEARING, 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   DEGENERATIVE JOINT DISEASE 11/23/2009   DEPRESSION 11/23/2009   FATIGUE 11/23/2009   GAIT DISTURBANCE 12/10/2009   HEMOPTYSIS UNSPECIFIED 05/07/2010   High cholesterol    HYPERTENSION 07/30/2009   HYPOTHYROIDISM 07/30/2009   Ischemic cardiomyopathy 09/02/2017   LUMBAR RADICULOPATHY, 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

## 2024-01-19 NOTE — Procedures (Addendum)
 Insertion of Chest Tube Procedure Note  DEMARIUS ARCHILA  [id number]  1941-06-07  Date:01/19/24  Time:5:28 PM    Provider Performing: Deward LELON Eastern   Procedure: Chest Tube Insertion [phone])  Indication(s) Effusion  Consent Risks of the procedure as well as the alternatives and risks of each were explained to the patient and/or caregiver.  Consent for the procedure was obtained and is signed in the bedside chart  Anesthesia Topical only with 1% lidocaine     Time Out Verified patient identification, verified procedure, site/side was marked, verified correct patient position, special equipment/implants available, medications/allergies/relevant history reviewed, required imaging and test results available.   Sterile Technique Maximal sterile technique including full sterile barrier drape, hand hygiene, sterile gown, sterile gloves, mask, hair covering, sterile ultrasound probe cover (if used).   Procedure Description Ultrasound used to identify appropriate pleural anatomy for placement and overlying skin marked. Area of placement cleaned and draped in sterile fashion.  A 14 French pigtail pleural catheter was placed into the left pleural space using Seldinger technique. Appropriate return of 2.3L clear golden fluid was obtained.  The tube was connected to atrium and placed on -20 cm H2O wall suction.   Complications/Tolerance None; patient tolerated the procedure well. Chest X-ray is ordered to verify placement.   EBL Minimal  Specimen(s) none      Deward Eastern, AGACNP-BC [facility]  See Amion for personal pager PCCM on call pager [phone] until 7pm. Please call Elink 7p-7a. [phone]  01/19/2024 5:29 PM

## 2024-01-19 NOTE — ED Notes (Signed)
 Called lab to run pleural specimen fluid from the chest tube.

## 2024-01-19 NOTE — ED Notes (Signed)
 Coming from Pulmonology recent hemo, today increased SHOB Large left effusion with smaller one on Rt. Pt has ext swelling (on Lasix ) 4L 02 with increased work load.

## 2024-01-19 NOTE — Progress Notes (Signed)
 Pt states he wears CPAP at home but don't want to wear it here. He also states he don't want to wear the BIPAP the high flow nasal cannula is working for him at this time.

## 2024-01-19 NOTE — ED Notes (Signed)
 Dr. Rosan at bedside to do chest tube.

## 2024-01-19 NOTE — Consult Note (Signed)
 NAME:  [name], MRN:  [id number], DOB:  [birth year], LOS: 0 ADMISSION DATE:  01/19/2024, CONSULTATION DATE:  6/24 REFERRING MD:  Dr. 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  He was recently admitted to [facility] long with hypoxia following a fall and hemoptysis.  He is also found to have left-sided pleural effusion which was determined to be hemothorax after having a CT-guided chest tube placed.  Chest tube was removed on May 13 and he was deemed to be a candidate for discharge.  Since that time he has been doing pretty well nearly back to his recent baseline.  Then 6/20 he developed progressive shortness of breath.  Denies fever chills or productive cough.  Did notice some fluttering in his chest but no pain, pressure, or radiation.  He presented to the pulmonary clinic 6/24 after symptoms progressed to the point he was able to ambulate more than 15 feet.  Chest x-ray was obtained in the clinic and showed large left-sided pleural effusion.  He was referred to the emergency department for further evaluation.  Upon arrival to the emergency department he was started on BiPAP for work of breathing which helped quite a bit.  CT of the chest showed left greater than right pleural effusions with concern for pleural malignancy.  PCCM asked to evaluate for pleural effusion.  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), DEGENERATIVE JOINT DISEASE (11/23/2009), DEPRESSION (11/23/2009), FATIGUE (11/23/2009), GAIT DISTURBANCE (12/10/2009), HEMOPTYSIS UNSPECIFIED (05/07/2010), High cholesterol, HYPERTENSION (07/30/2009), HYPOTHYROIDISM (07/30/2009), Ischemic cardiomyopathy (09/02/2017), LUMBAR RADICULOPATHY, 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   Significant Hospital Events: Including procedures, antibiotic start and stop dates in addition to other pertinent events     Interim History / Subjective:    Objective    Blood pressure (!) 142/85, pulse 73, temperature (!) 97.1 F (36.2 C), temperature source Axillary, resp. rate (!) 25, height 6' 2 (1.88 m), weight 91.2 kg, SpO2 (!) 63%.    FiO2 (%):  [50 %-60 %] 60 %   Intake/Output Summary (Last 24 hours) at 01/19/2024 1627 Last data filed at 01/19/2024 1310 Gross per 24 hour  Intake --  Output 1000 ml  Net -1000 ml   Filed Weights   01/19/24 1008  Weight: 91.2 kg    Examination: General: Thin elderly male in mild respiratory distress HENT: [place]/AT, PERRL, no JVD Lungs: Diminished bases. No wheeze.  Cardiovascular: RRR, no MRG Abdomen: Soft, Non-tender, non-distended Extremities: Erythema of the right lower extremity. 1+ pitting  pretibial edema.  Neuro: alert, oriented, non-focal   Resolved problem list   Assessment and Plan   Pleural effusion on the left:  recently admitted after fall with hemothorax. Now back with recurrent effusion. Chest tube placed in ED with 2.3 liters of clear dark straw colored fluid removed. Stopped  due to pain and tube clamped. Pleural based disease raises concern for malignancy.  - Send  fluid for cell count, culture, LDH, Protein, glucose, cytology - small air leak noted during drainage. Chest xray post-procedure reviewed and in appropriate position. Leave clamped overnight given substantial drainage and plans to obtain chest xray in am. If overnight any chest pain, or shortness of breath, recommend unclamping tube, placing to suction, and obtaining chest xray stat.  - Repeat CXR in AM  COPD : not acutely exacerbated.  Acute on chronic hypoxemic respiratory failure - Supplemental O2 to keep sats 88-95% (on 2-3 L at home) - Continue home albuterol  - brovana  and yupelri in place of home Stiolto - On chronic prednisone  for ITP  Rest per primary. PCCM will continue to follow chest tube and effusion management.   Verdon Gore, MD Pulmonary and Critical Care Medicine [facility] 01/19/2024 5:47 PM Pager: see AMION  If no response to pager, please call critical care on call (see AMION) until 7pm After 7:00 pm call Elink     Labs   CBC: Recent Labs  Lab 01/19/24 1001  WBC 8.7  NEUTROABS 7.4  HGB 12.7*  HCT 42.1  MCV 90.5  PLT 184    Basic Metabolic Panel: Recent Labs  Lab 01/19/24 1001  NA 137  K 4.7  CL 96*  CO2 28  GLUCOSE 287*  BUN 22  CREATININE 1.46*  CALCIUM  8.6*   GFR: Estimated Creatinine Clearance: 45.4 mL/min (A) (by C-G formula based on SCr of 1.46 mg/dL (H)). Recent Labs  Lab 01/19/24 1001  WBC 8.7    Liver Function Tests: No results for input(s): AST, ALT, ALKPHOS, BILITOT, PROT, ALBUMIN  in the last 168 hours. No results for input(s): LIPASE, AMYLASE in the last 168 hours. No results for input(s): AMMONIA in the last 168 hours.  ABG    Component Value Date/Time   PHART 7.328 (L) 08/22/2020 1312   PCO2ART 48.6 (H) 08/22/2020 1312   PO2ART 119 (H) 08/22/2020 1312   HCO3 25.5 08/22/2020 1312   TCO2 29 03/13/2022 0736   ACIDBASEDEF 1.0 08/22/2020 1312   O2SAT 98.0 08/22/2020 1312     Coagulation  Profile: No results for input(s): INR, PROTIME in the last 168 hours.  Cardiac Enzymes: No results for input(s): CKTOTAL, CKMB, CKMBINDEX, TROPONINI in the last 168 hours.  HbA1C: Hemoglobin A1C  Date/Time Value Ref Range Status  02/17/2018 12:00 AM 8.2  Final   Hgb A1c MFr Bld  Date/Time Value Ref Range Status  12/04/2023 12:32 AM 8.6 (H) 4.8 - 5.6 % Final    Comment:    (NOTE) Pre diabetes:          5.7%-6.4%  Diabetes:              >6.4%  Glycemic control for   <7.0% adults with diabetes   08/21/2020 11:09 AM 10.3 (H) 4.8 - 5.6 % Final    Comment:    (NOTE) Pre diabetes:          5.7%-6.4%  Diabetes:              >6.4%  Glycemic control for   <7.0% adults with diabetes     CBG: No results for input(s): GLUCAP in the last 168 hours.  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), Anxiety,  Aortic aneurysm (HCC) (09/02/2017), BENIGN PROSTATIC HYPERTROPHY (11/23/2009), Cardiomyopathy (HCC) (08/28/2016), Chronic systolic CHF (congestive heart failure) (HCC) (09/02/2017), COPD (chronic obstructive pulmonary disease) (HCC), CORONARY ARTERY DISEASE (11/23/2009), DECREASED HEARING, LEFT EAR (03/01/2010), DEGENERATIVE JOINT DISEASE (11/23/2009), DEPRESSION (11/23/2009), FATIGUE (11/23/2009), GAIT DISTURBANCE (12/10/2009), HEMOPTYSIS UNSPECIFIED (05/07/2010), High cholesterol, HYPERTENSION (07/30/2009), HYPOTHYROIDISM (07/30/2009), Ischemic cardiomyopathy (09/02/2017), LUMBAR RADICULOPATHY, RIGHT (06/05/2010), On home oxygen  therapy, OSA on CPAP, Pneumonia, PTSD (post-traumatic stress disorder) (03/10/2012), PULMONARY FIBROSIS (06/18/2010), RA (rheumatoid arthritis) (HCC) (06/11/2011), RESPIRATORY FAILURE, CHRONIC (07/31/2009), Scleritis of both eyes (03/17/2014), Thrombocytopenia (HCC), TREMOR (11/23/2009), and Type II diabetes  mellitus (HCC).   Surgical History:   Past Surgical History:  Procedure Laterality Date   ABDOMINAL AORTIC ANEURYSM REPAIR  07/2002   thelbert 12/10/2010   ABDOMINAL AORTOGRAM W/LOWER EXTREMITY N/A 08/16/2020   Procedure: ABDOMINAL AORTOGRAM W/LOWER EXTREMITY;  Surgeon: Gretta Lonni PARAS, MD;  Location: [facility] INVAS[facility];  Service: Cardiovascular;  Laterality: N/A;   ABDOMINAL AORTOGRAM W/LOWER EXTREMITY N/A 10/24/2021   Procedure: ABDOMINAL AORTOGRAM W/LOWER EXTREMITY;  Surgeon: Gretta Lonni PARAS, MD;  Location: [facility];  Service: Cardiovascular;  Laterality: N/A;   ABDOMINAL AORTOGRAM W/LOWER EXTREMITY N/A 03/13/2022   Procedure: ABDOMINAL AORTOGRAM W/LOWER EXTREMITY;  Surgeon: Gretta Lonni PARAS, MD;  Location: [facility];  Service: Cardiovascular;  Laterality: N/A;   ABDOMINAL EXPLORATION SURGERY  02/2004   w/LOA/notes 12/10/2010; small bowel obstruction repair with adhesiolysis    BACK SURGERY     CARDIAC CATHETERIZATION     2 heart caths in the past.  One in 2000s showed one ulcerated plaque  Rx medically; Second at [facility] /notes 09/05/2016   CATARACT EXTRACTION W/ INTRAOCULAR LENS  IMPLANT, BILATERAL Bilateral 2000s   COLECTOMY     hx of remote ileum resection due to bleeding   COLONOSCOPY WITH PROPOFOL  N/A 01/22/2018   Procedure: COLONOSCOPY WITH PROPOFOL ;  Surgeon: Avram Lupita BRAVO, MD;  Location: [facility];  Service: Endoscopy;  Laterality: N/A;   CORONARY ANGIOPLASTY WITH STENT PLACEMENT  09/10/2016   CORONARY STENT INTERVENTION N/A 09/10/2016   Procedure: Coronary Stent Intervention;  Surgeon: Lonni JONETTA Cash, MD;  Location: [facility];  Service: Cardiovascular;  Laterality: N/A;  Distal RCA 4.0x16 Synergy   ESOPHAGOGASTRODUODENOSCOPY (EGD) WITH PROPOFOL  N/A 01/22/2018   Procedure: ESOPHAGOGASTRODUODENOSCOPY (EGD) WITH PROPOFOL ;  Surgeon: Avram Lupita BRAVO, MD;  Location: [facility];  Service: Endoscopy;  Laterality: N/A;   EYE SURGERY     FALSE  ANEURYSM REPAIR Left 08/22/2020   Procedure: EXCLUSION OF LEFT POPLITEAL ARTERY ANEURYSM;  Surgeon: Gretta Lonni PARAS, MD;  Location[facility] OR;  Service: Vascular;  Laterality: Left;   FEMORAL EMBOLOECTOMY Left 07/2000   with left leg ischemia; Dr. Gerlean, vascular   FEMORAL-POPLITEAL BYPASS GRAFT Bilateral 08/22/2020   Procedure: LEFT FEMORAL-POSTERIOR TIBIAL ARTERY BYPASS GRAFT USING RIGHT GREATER NONREVERSED SAPHENOUS VEIN GRAFT;  Surgeon: Gretta Lonni PARAS, MD;  Location: [facility] OR;  Service: Vascular;  Laterality: Bilateral;   GANGLION CYST EXCISION Right    wrist; Dr. Leonor   HOT HEMOSTASIS N/A 01/22/2018   Procedure: HOT HEMOSTASIS (ARGON PLASMA COAGULATION/BICAP);  Surgeon: Avram Lupita BRAVO, MD;  Location: [facility];  Service: Endoscopy;  Laterality: N/A;   LOOP RECORDER INSERTION N/A 04/25/2019   Procedure: LOOP RECORDER INSERTION;  Surgeon: Fernande Elspeth BROCKS, MD;  Location: [facility] INVASIVE CV LAB;  Service: Cardiovascular;  Laterality: N/A;   LOOP RECORDER REMOVAL N/A 07/01/2019   Procedure: LOOP RECORDER REMOVAL;  Surgeon: Fernande Elspeth BROCKS, MD;  Location: [facility]  INVASIVE CV LAB;  Service: Cardiovascular;  Laterality: N/A;   LUMBAR LAMINECTOMY  1972   Dr. Beverley   PACEMAKER IMPLANT N/A 07/01/2019   Procedure: PACEMAKER IMPLANT;  Surgeon: Fernande Elspeth BROCKS, MD;  Location: [facility] INVASIVE CV LAB;  Service: Cardiovascular;  Laterality: N/A;   PERIPHERAL VASCULAR BALLOON ANGIOPLASTY Left 10/24/2021   Procedure: PERIPHERAL VASCULAR BALLOON ANGIOPLASTY;  Surgeon: Gretta Lonni PARAS, MD;  Location: [facility];  Service: Cardiovascular;  Laterality: Left;   RIGHT/LEFT HEART CATH AND CORONARY ANGIOGRAPHY N/A 09/10/2016   Procedure: Right/Left Heart Cath and Coronary Angiography;  Surgeon: Lonni JONETTA Cash, MD;  Location: [facility] INVASIVE CV LAB;  Service: Cardiovascular;  Laterality: N/A;   TONSILLECTOMY       Social History:   reports that he quit smoking about 25 years ago. His smoking use included  cigarettes, pipe, and cigars. He started smoking about 65 years ago. He has a 100 pack-year smoking history. He has never been exposed to tobacco smoke. He has never used smokeless tobacco. He reports that he does not drink alcohol  and does not use drugs.   Family History:  His family history includes Other in his mother. He was adopted.   Allergies No Known Allergies   Home Medications  Prior to Admission medications   Medication Sig Start Date End Date Taking? Authorizing Provider  acetaminophen  (TYLENOL ) 500 MG tablet Take 1,000 mg by mouth every 6 (six) hours as needed for mild pain.    [provider]  albuterol  (PROVENTIL ) (2.5 MG/3ML) 0.083% nebulizer solution Take 3 mLs (2.5 mg total) by nebulization every 6 (six) hours as needed for wheezing or shortness of breath. 12/29/23   Parrett, Madelin RAMAN, NP  albuterol  (VENTOLIN  HFA) 108 (90 Base) MCG/ACT inhaler Inhale 1-2 puffs into the lungs every 6 (six) hours as needed for wheezing or shortness of breath. 01/07/24   Parrett, Madelin RAMAN, NP  amoxicillin -clavulanate (AUGMENTIN ) 875-125 MG tablet Take 1 tablet by mouth 2 (two) times daily. 12/31/23   Parrett, Madelin RAMAN, NP  amoxicillin -clavulanate (AUGMENTIN ) 875-125 MG tablet Take 1 tablet by mouth 2 (two) times daily. 01/07/24   Parrett, Madelin RAMAN, NP  aspirin  81 MG chewable tablet Chew 81 mg by mouth daily. 12/30/23: Patient reports during [facility] call: [facility] provider instructed to start taking on 12/23/23 (verified through review of EHR) in light of Plavix  continued to being held post- recent hospital discharge on 12/08/23    Rollene Almarie LABOR, MD  atorvastatin  (LIPITOR ) 40 MG tablet Take 1 tablet (40 mg total) by mouth daily. 10/21/16   Okey Vina GAILS, MD  Carboxymethylcellul-Glycerin (LUBRICATING EYE DROPS OP) Place 1 drop into both eyes 4 (four) times daily as needed (dry eyes).     [provider]  carvedilol  (COREG ) 3.125 MG tablet TAKE 1 TABLET(3.125 MG) BY MOUTH TWICE DAILY WITH A MEAL  07/20/23   Rollene Almarie LABOR, MD  Cholecalciferol  (VITAMIN D3) 2000 units capsule Take 2,000 Units by mouth daily.  01/11/16   [provider]  cyanocobalamin  (VITAMIN B12) 500 MCG tablet Take 500 mcg by mouth daily. 07/30/22   [provider]  empagliflozin  (JARDIANCE ) 10 MG TABS tablet Take 1 tablet (10 mg total) by mouth daily before breakfast. 12/23/23   Pietro Redell RAMAN, MD  ferrous sulfate  324 (65 Fe) MG TBEC Take 324 mg by mouth daily. 10/02/21   [provider]  fluticasone  (FLONASE ) 50 MCG/ACT nasal spray Place 1 spray into both nostrils daily as needed for allergies. 12/09/23  Cheryle Page, MD  furosemide  (LASIX ) 20 MG tablet Take 1 tablet (20 mg total) by mouth daily as needed. 12/29/23   Parrett, Madelin RAMAN, NP  guaifenesin  (HUMIBID E) 400 MG TABS tablet Take 400 mg by mouth in the morning and at bedtime. 05/23/23   [provider]  imiquimod (ALDARA) 5 % cream Apply 1 Application topically 3 (three) times a week. APPLY TO WARTS 3-5 TIMES PER WEEK FOR 6 WEEKS OR UNTIL WARTS DISAPPEAR 12/08/22   [provider]  insulin  aspart (NOVOLOG ) 100 UNIT/ML injection Inject 0-12 Units into the skin 3 (three) times daily before meals.    [provider]  insulin  glargine (LANTUS ) 100 UNIT/ML injection Inject 20 Units into the skin daily.    [provider]  levothyroxine  (SYNTHROID ) 125 MCG tablet Take 125 mcg by mouth daily before breakfast. 12/07/22   [provider]  losartan  (COZAAR ) 100 MG tablet Take 1 tablet (100 mg total) by mouth daily. 12/23/23 03/22/24  Pietro Redell RAMAN, MD  Multiple Vitamins-Minerals (MEGA MULTIVITAMIN FOR MEN PO) Take 1 tablet by mouth daily.    [provider]  Olodaterol HCl 2.5 MCG/ACT AERS Inhale 2 puffs into the lungs daily at 12 noon.    [provider]  OXYGEN  Inhale 2 L into the lungs continuous.    [provider]  predniSONE  (DELTASONE ) 10 MG tablet Take 1 tablet (10 mg  total) by mouth daily with breakfast. 12/01/23   Odean Potts, MD  pregabalin  (LYRICA ) 100 MG capsule Take 100 mg by mouth daily.    [provider]  Semaglutide  (OZEMPIC , 1 MG/DOSE, [place]) Inject 1 mg into the skin every Friday.    [provider]  sertraline  (ZOLOFT ) 100 MG tablet Take 50 mg by mouth daily. 11/06/22   [provider]  tamsulosin  (FLOMAX ) 0.4 MG CAPS capsule Take 1 capsule (0.4 mg total) by mouth at bedtime. 09/14/17   Frann Mabel Mt, DO  Tiotropium Bromide -Olodaterol (STIOLTO RESPIMAT ) 2.5-2.5 MCG/ACT AERS Inhale 2 puffs into the lungs daily. 03/01/21   Parrett, Madelin RAMAN, NP  traZODone  (DESYREL ) 50 MG tablet Take 50 mg by mouth at bedtime. 11/06/22   [provider]

## 2024-01-19 NOTE — ED Notes (Signed)
 Rosan, NP placed chest tube. 3 sterile specimen cups were walked to the lab with patient label. No orders placed to run the specimen. Consent paper was at bedside for NP to sign.

## 2024-01-19 NOTE — H&P (Signed)
 History and Physical  DVANTE HANDS FMW:[id number] DOB: [birth year] DOA: 01/19/2024  PCP: Rollene Almarie LABOR, MD   Chief Complaint: Shortness of breath  HPI: [name] is a 83 y.o. 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  During that hospitalization, he had CT-guided chest tube placement with pulmonology following.  Chest tube was removed on 5/13, and there were no malignant cells seen on cytology.  Patient states that overall he has been doing well since he was discharged from the hospital, has followed up as an outpatient with pulmonology.  He has been taking oral Lasix  at home, but for the last 3 days or so, he has had worsening dyspnea on exertion, and some left lower extremity swelling.  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  Review of Systems: Please see HPI for pertinent positives and negatives. A complete 10 system review of systems are otherwise negative.  Past Medical History:  Diagnosis Date   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   ablated   Anxiety    Aortic aneurysm (HCC) 09/02/2017   BENIGN PROSTATIC HYPERTROPHY 11/23/2009   Cardiomyopathy (HCC) 08/28/2016   Chronic systolic CHF (congestive heart failure) (HCC) 09/02/2017   COPD (chronic obstructive pulmonary disease) (HCC)    CORONARY ARTERY DISEASE 11/23/2009   DECREASED HEARING, 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   DEGENERATIVE JOINT DISEASE 11/23/2009   DEPRESSION 11/23/2009    FATIGUE 11/23/2009   GAIT DISTURBANCE 12/10/2009   HEMOPTYSIS UNSPECIFIED 05/07/2010   High cholesterol    HYPERTENSION 07/30/2009   HYPOTHYROIDISM 07/30/2009   Ischemic cardiomyopathy 09/02/2017   LUMBAR RADICULOPATHY, 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   On home oxygen  therapy    2-3L; 24/7 (09/10/2016)   OSA on CPAP    Pneumonia    PTSD (post-traumatic stress disorder) 03/10/2012   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   RA (rheumatoid arthritis) (HCC) 06/11/2011   qwhere (09/10/2016)   RESPIRATORY FAILURE, CHRONIC 07/31/2009   Scleritis of both eyes 03/17/2014   Thrombocytopenia (HCC)    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   Type II diabetes mellitus (HCC)    Past Surgical History:  Procedure Laterality Date   ABDOMINAL AORTIC ANEURYSM REPAIR  07/2002   thelbert 12/10/2010   ABDOMINAL AORTOGRAM W/LOWER EXTREMITY N/A 08/16/2020   Procedure: ABDOMINAL AORTOGRAM W/LOWER EXTREMITY;  Surgeon: Gretta Lonni PARAS, MD;  Location: [facility];  Service: Cardiovascular;  Laterality: N/A;   ABDOMINAL AORTOGRAM W/LOWER EXTREMITY N/A 10/24/2021   Procedure: ABDOMINAL AORTOGRAM W/LOWER EXTREMITY;  Surgeon: Gretta Lonni PARAS, MD;  Location: [facility];  Service: Cardiovascular;  Laterality: N/A;   ABDOMINAL AORTOGRAM W/LOWER EXTREMITY N/A 03/13/2022   Procedure: ABDOMINAL AORTOGRAM W/LOWER EXTREMITY;  Surgeon: Gretta Lonni PARAS, MD;  Location: [facility];  Service: Cardiovascular;  Laterality: N/A;   ABDOMINAL EXPLORATION SURGERY  02/2004   w/LOA/notes 12/10/2010; small bowel obstruction repair with adhesiolysis    BACK SURGERY     CARDIAC CATHETERIZATION     2 heart caths in the past.  One in 2000s showed one ulcerated plaque  Rx medically; [facility] /notes 09/05/2016   CATARACT EXTRACTION W/ INTRAOCULAR LENS  IMPLANT, BILATERAL Bilateral 2000s   COLECTOMY     hx of remote ileum resection due to bleeding   COLONOSCOPY WITH PROPOFOL  N/A 01/22/2018   Procedure: COLONOSCOPY WITH PROPOFOL ;  Surgeon: Avram Lupita BRAVO,  MD;  Location: [facility];  Service: Endoscopy;  Laterality: N/A;   CORONARY ANGIOPLASTY WITH STENT PLACEMENT  09/10/2016   CORONARY STENT INTERVENTION N/A 09/10/2016   Procedure: Coronary Stent Intervention;  Surgeon: Lonni JONETTA Cash, MD;  Location: [facility];  Service: Cardiovascular;  Laterality: N/A;  Distal RCA 4.0x16 Synergy   ESOPHAGOGASTRODUODENOSCOPY (EGD) WITH PROPOFOL  N/A 01/22/2018   Procedure: ESOPHAGOGASTRODUODENOSCOPY (EGD) WITH PROPOFOL ;  Surgeon: Avram Lupita BRAVO, MD;  Location: [facility];  Service: Endoscopy;  Laterality: N/A;   EYE SURGERY     FALSE ANEURYSM REPAIR Left 08/22/2020   Procedure: EXCLUSION OF LEFT POPLITEAL ARTERY ANEURYSM;  Surgeon: Gretta Lonni PARAS, MD;  Location: [facility] OR;  Service: Vascular;  Laterality: Left;   FEMORAL EMBOLOECTOMY Left 07/2000   with left leg ischemia; Dr. Gerlean, vascular   FEMORAL-POPLITEAL BYPASS GRAFT Bilateral 08/22/2020   Procedure: LEFT FEMORAL-POSTERIOR TIBIAL ARTERY BYPASS GRAFT USING RIGHT GREATER NONREVERSED SAPHENOUS VEIN GRAFT;  Surgeon: Gretta Lonni PARAS, MD;  Location: [facility] OR;  Service: Vascular;  Laterality: Bilateral;   GANGLION CYST EXCISION Right    wrist; Dr. Leonor   HOT HEMOSTASIS N/A 01/22/2018   Procedure: HOT HEMOSTASIS (ARGON PLASMA COAGULATION/BICAP);  Surgeon: Avram Lupita BRAVO, MD;  Location: [facility];  Service: Endoscopy;  Laterality: N/A;   LOOP RECORDER INSERTION N/A 04/25/2019   Procedure: LOOP RECORDER INSERTION;  Surgeon: Fernande Elspeth BROCKS, MD;  Location: [facility] INV[facility] CV LAB;  Service: Cardiovascular;  Laterality: N/A;   LOOP RECORDER REMOVAL N/A 07/01/2019   Procedure: LOOP RECORDER REMOVAL;  Surgeon: Fernande Elspeth BROCKS, MD;  Location: [facility] INVASIVE CV LAB;  Service: Cardiovascular;  Laterality: N/A;   LUMBAR LAMINECTOMY  1972   Dr. Beverley   PACEMAKER IMPLANT N/A 07/01/2019   Procedure: PACEMAKER IMPLANT;  Surgeon: Fernande Elspeth BROCKS, MD;  Location: [facility] INVASIVE CV LAB;  Service: Cardiovascular;   Laterality: N/A;   PERIPHERAL VASCULAR BALLOON ANGIOPLASTY Left 10/24/2021   Procedure: PERIPHERAL VASCULAR BALLOON ANGIOPLASTY;  Surgeon: Gretta Lonni PARAS, MD;  Location: [facility];  Service: Cardiovascular;  Laterality: Left;   RIGHT/LEFT HEART CATH AND CORONARY ANGIOGRAPHY N/A 09/10/2016   Procedure: Right/Left Heart Cath and Coronary Angiography;  Surgeon: Lonni JONETTA Cash, MD;  Location: [facility] INVASIVE CV LAB;  Service: Cardiovascular;  Laterality: N/A;   TONSILLECTOMY     Social History:  reports that he quit smoking about 25 years ago. His smoking use included cigarettes, pipe, and cigars. He started smoking about 65 years ago. He has a 100 pack-year smoking history. He has never been exposed to tobacco smoke. He has never used smokeless tobacco. He reports that he does not drink alcohol  and does not use drugs.  No Known Allergies  Family History  Adopted: Yes  Problem Relation Age of Onset   Other Mother        gun shot     Prior to Admission medications   Medication Sig Start Date End Date Taking? Authorizing Provider  acetaminophen  (TYLENOL ) 500 MG tablet Take 1,000 mg by mouth every 6 (six) hours as needed for mild pain.    [provider]  albuterol  (PROVENTIL ) (2.5 MG/3ML) 0.083% nebulizer solution Take 3 mLs (2.5  mg total) by nebulization every 6 (six) hours as needed for wheezing or shortness of breath. 12/29/23   Parrett, Madelin RAMAN, NP  albuterol  (VENTOLIN  HFA) 108 (90 Base) MCG/ACT inhaler Inhale 1-2 puffs into the lungs every 6 (six) hours as needed for wheezing or shortness of breath. 01/07/24   Parrett, Madelin RAMAN, NP  amoxicillin -clavulanate (AUGMENTIN ) 875-125 MG tablet Take 1 tablet by mouth 2 (two) times daily. 12/31/23   Parrett, Madelin RAMAN, NP  amoxicillin -clavulanate (AUGMENTIN ) 875-125 MG tablet Take 1 tablet by mouth 2 (two) times daily. 01/07/24   Parrett, Madelin RAMAN, NP  aspirin  81 MG chewable tablet Chew 81 mg by mouth daily. 12/30/23: Patient reports  during [facility] call: [facility] provider instructed to start taking on 12/23/23 (verified through review of EHR) in light of Plavix  continued to being held post- recent hospital discharge on 12/08/23    Rollene Almarie LABOR, MD  atorvastatin  (LIPITOR ) 40 MG tablet Take 1 tablet (40 mg total) by mouth daily. 10/21/16   Okey Vina GAILS, MD  Carboxymethylcellul-Glycerin (LUBRICATING EYE DROPS OP) Place 1 drop into both eyes 4 (four) times daily as needed (dry eyes).     [provider]  carvedilol  (COREG ) 3.125 MG tablet TAKE 1 TABLET(3.125 MG) BY MOUTH TWICE DAILY WITH A MEAL 07/20/23   Rollene Almarie LABOR, MD  Cholecalciferol  (VITAMIN D3) 2000 units capsule Take 2,000 Units by mouth daily.  01/11/16   [provider]  cyanocobalamin  (VITAMIN B12) 500 MCG tablet Take 500 mcg by mouth daily. 07/30/22   [provider]  empagliflozin  (JARDIANCE ) 10 MG TABS tablet Take 1 tablet (10 mg total) by mouth daily before breakfast. 12/23/23   Pietro Redell RAMAN, MD  ferrous sulfate  324 (65 Fe) MG TBEC Take 324 mg by mouth daily. 10/02/21   [provider]  fluticasone  (FLONASE ) 50 MCG/ACT nasal spray Place 1 spray into both nostrils daily as needed for allergies. 12/09/23   Cheryle Page, MD  furosemide  (LASIX ) 20 MG tablet Take 1 tablet (20 mg total) by mouth daily as needed. 12/29/23   Parrett, Madelin RAMAN, NP  guaifenesin  (HUMIBID E) 400 MG TABS tablet Take 400 mg by mouth in the morning and at bedtime. 05/23/23   [provider]  imiquimod (ALDARA) 5 % cream Apply 1 Application topically 3 (three) times a week. APPLY TO WARTS 3-5 TIMES PER WEEK FOR 6 WEEKS OR UNTIL WARTS DISAPPEAR 12/08/22   [provider]  insulin  aspart (NOVOLOG ) 100 UNIT/ML injection Inject 0-12 Units into the skin 3 (three) times daily before meals.    [provider]  insulin  glargine (LANTUS ) 100 UNIT/ML injection Inject 20 Units into the skin daily.    [provider]  levothyroxine   (SYNTHROID ) 125 MCG tablet Take 125 mcg by mouth daily before breakfast. 12/07/22   [provider]  losartan  (COZAAR ) 100 MG tablet Take 1 tablet (100 mg total) by mouth daily. 12/23/23 03/22/24  Pietro Redell RAMAN, MD  Multiple Vitamins-Minerals (MEGA MULTIVITAMIN FOR MEN PO) Take 1 tablet by mouth daily.    [provider]  Olodaterol HCl 2.5 MCG/ACT AERS Inhale 2 puffs into the lungs daily at 12 noon.    [provider]  OXYGEN  Inhale 2 L into the lungs continuous.    [provider]  predniSONE  (DELTASONE ) 10 MG tablet Take 1 tablet (10 mg total) by mouth daily with breakfast. 12/01/23   Odean Potts, MD  pregabalin  (LYRICA ) 100 MG capsule Take 100 mg by mouth daily.  [provider]  Semaglutide  (OZEMPIC , 1 MG/DOSE, [place]) Inject 1 mg into the skin every Friday.    [provider]  sertraline  (ZOLOFT ) 100 MG tablet Take 50 mg by mouth daily. 11/06/22   [provider]  tamsulosin  (FLOMAX ) 0.4 MG CAPS capsule Take 1 capsule (0.4 mg total) by mouth at bedtime. 09/14/17   Frann Mabel Mt, DO  Tiotropium Bromide -Olodaterol (STIOLTO RESPIMAT ) 2.5-2.5 MCG/ACT AERS Inhale 2 puffs into the lungs daily. 03/01/21   Parrett, Madelin RAMAN, NP  traZODone  (DESYREL ) 50 MG tablet Take 50 mg by mouth at bedtime. 11/06/22   [provider]    Physical Exam: BP (!) 129/113   Pulse 69   Temp 97.6 F (36.4 C) (Axillary)   Resp 20   Ht 6' 2 (1.88 m)   Wt 91.2 kg   SpO2 96%   BMI 25.81 kg/m  General:  Alert, oriented, calm, in no acute distress, wearing supplemental oxygen  currently receiving breathing treatment.  Sitting up on the edge of the bed looks comfortable.  Able to speak in full sentences, no cough. Cardiovascular: RRR, no murmurs or rubs, he has 1+ pitting edema in the left lower extremity Respiratory: Breath sounds are dim, with minimal rhonchi.  Breath sounds are diminished at the left base.  No tachypnea, or active  wheezing Abdomen: soft, nontender, nondistended, normal bowel tones heard  Skin: dry, no rashes  Musculoskeletal: no joint effusions, normal range of motion  Psychiatric: appropriate affect, normal speech  Neurologic: extraocular muscles intact, clear speech, moving all extremities with intact sensorium         Labs on Admission:  Basic Metabolic Panel: Recent Labs  Lab 01/19/24 1001  NA 137  K 4.7  CL 96*  CO2 28  GLUCOSE 287*  BUN 22  CREATININE 1.46*  CALCIUM  8.6*   Liver Function Tests: No results for input(s): AST, ALT, ALKPHOS, BILITOT, PROT, ALBUMIN  in the last 168 hours. No results for input(s): LIPASE, AMYLASE in the last 168 hours. No results for input(s): AMMONIA in the last 168 hours. CBC: Recent Labs  Lab 01/19/24 1001  WBC 8.7  NEUTROABS 7.4  HGB 12.7*  HCT 42.1  MCV 90.5  PLT 184   Cardiac Enzymes: No results for input(s): CKTOTAL, CKMB, CKMBINDEX, TROPONINI in the last 168 hours. BNP (last 3 results) Recent Labs    12/03/23 1607 01/19/24 1000  BNP 68.4 285.1*    ProBNP (last 3 results) No results for input(s): PROBNP in the last 8760 hours.  CBG: No results for input(s): GLUCAP in the last 168 hours.  Radiological Exams on Admission: CT Chest W Contrast Result Date: 01/19/2024 CLINICAL DATA:  Pneumonia suspected.  * Tracking Code: BO * EXAM: CT CHEST WITH CONTRAST TECHNIQUE: Multidetector CT imaging of the chest was performed during intravenous contrast administration. RADIATION DOSE REDUCTION: This exam was performed according to the departmental dose-optimization program which includes automated exposure control, adjustment of the mA and/or kV according to patient size and/or use of iterative reconstruction technique. CONTRAST:  75mL OMNIPAQUE  IOHEXOL  300 MG/ML  SOLN COMPARISON:  Plain film of earlier today. Most recent chest CT 12/03/2023. FINDINGS: Cardiovascular: Pacer. Aneurysm off the inferior transverse aorta  is similar at 3.9 cm on 15/2. No surrounding hemorrhage. Tortuous thoracic aorta. Normal heart size, without pericardial effusion. Three vessel coronary artery calcification. No central pulmonary embolism, on this non-dedicated study. Pulmonary artery enlargement, outflow tract 3.8 cm. Mediastinum/Nodes: No mediastinal or hilar adenopathy. Lungs/Pleura: The left apical heterogeneous soft  tissue density has been drained in the interval and most likely hematoma. Right-sided pleural calcified plaques. Irregular left-sided complex left pleural thickening, especially superiorly. Increased moderate volume left-sided pleural fluid inferiorly. Left-sided subpleural nodularity/adenopathy in the juxta cardiac space including at 1.9 cm on 47/2, 1.5 cm on the prior. No right-sided pleural fluid. Advanced centrilobular emphysema. Improved left apical aeration with residual scarring and volume loss. Worsened left lower lobe collapse/consolidation. Upper Abdomen: Numerous tiny gallstones. Normal imaged portions of the spleen, stomach, pancreas, adrenal glands. Upper pole left-sided caliectasis on 71/2. Aortic dilatation at the level of the left renal artery origin at 3.8 cm is similar. Musculoskeletal: Old left rib fractures. Minimally displaced posterior seventh left rib fracture. Mild compression deformity at T12 with minimal ventral canal encroachment, similar. IMPRESSION: 1. 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 Subpleural/left cardiophrenic angle bulky adenopathy, progressive. 2. Increased left pleural fluid with left lower lobe collapse/consolidation. Atelectasis and/or infection. 3. No convincing evidence of primary malignancy within the lungs. 4. Possible upper pole left-sided caliectasis. Correlate with abdominal symptoms and possibly dedicated imaging. 5. 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 6. Right-sided pleural  calcified plaques, likely result of remote empyema or hemothorax. 7. Pulmonary artery enlargement suggests pulmonary arterial hypertension. 8. Incidental findings, including: Aortic atherosclerosis (ICD10-I70.0), coronary artery atherosclerosis and emphysema (ICD10-J43.9). Pulmonary artery enlargement suggests pulmonary arterial hypertension. Cholelithiasis. Electronically Signed   By: Rockey Kilts M.D.   On: 01/19/2024 13:44   DG Chest 2 View Result Date: 01/19/2024 CLINICAL DATA:  Shortness of breath, weight gain. EXAM: CHEST - 2 VIEW COMPARISON:  January 07, 2024. FINDINGS: Stable cardiomediastinal silhouette. Left-sided pacemaker is unchanged. Extensive right pleural calcifications are noted suggesting asbestos exposure. Small bilateral pleural effusions are noted. Bilateral lung opacities are noted concerning for infiltrates or atelectasis. Bony thorax is unremarkable. IMPRESSION: Stable right-sided pleural plaques. Mildly increased bibasilar lung opacities are noted concerning for infiltrates or atelectasis. Small pleural effusions. Electronically Signed   By: Lynwood Landy Raddle M.D.   On: 01/19/2024 09:56   Assessment/Plan [name] is a 83 y.o. 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  Acute on chronic hypoxic respiratory failure-in the setting of severe COPD, and recurrent moderate left pleural effusion of unclear etiology.  Could be related to heart failure, however with cardiophrenic angle bulky adenopathy there is concern for malignancy. -Inpatient admission -Supplemental oxygen  -Discussed with  pulmonology who will consult regarding possible thoracentesis versus chest tube, and recommendations regarding diagnostics of possible malignancy  Acute on chronic heart failure with preserved EF-patient looks volume overloaded on exam, BNP elevated to 285.  Received IV Lasix  in the emergency department -Continue Coreg  -Plan to redose Lasix  in the morning pending response and renal function  Hypertension-Cozaar   CKD stage III-renal function appears to be in the range of his normal baseline, will need to monitor closely in the setting of diuresis  Hypothyroidism-Synthroid   Hyperlipidemia-Lipitor   Type 2 diabetes-carb modified heart healthy diet, with moderate dose sliding scale  Depression-Zoloft   DVT prophylaxis: SCDs only    Code Status: Limited: Do not attempt resuscitation (DNR) -DNR-LIMITED -Do Not Intubate/DNI   Consults called:  Pulmonology  Admission status: The appropriate patient status for this patient is INPATIENT. Inpatient status is judged to be reasonable and necessary in order to provide the required intensity of service to ensure the patient's safety. The patient's presenting symptoms, physical exam findings, and initial radiographic and laboratory data in the context of their chronic comorbidities is felt to place them at high risk for further clinical deterioration. Furthermore, it is not anticipated that the patient will be medically stable for discharge from the hospital within 2 midnights of admission.    I certify that at the point of admission it is my clinical judgment that the patient will require inpatient hospital care spanning beyond 2 midnights from the point of admission due to high intensity of service, high risk for further deterioration and high frequency of surveillance required  Time spent: 59 minutes  Chauna Osoria CHRISTELLA Gail MD [facility] Pager [phone]  If 7PM-7AM, please contact night-coverage [email or web address] Password [email or web address]  01/19/2024, 3:49  PM

## 2024-01-19 NOTE — ED Notes (Signed)
 Patient transported to CT

## 2024-01-19 NOTE — Progress Notes (Signed)
 History of Present Illness [name] is a 83 y.o. male former smoker with very severe COPD,  chronic hypoxic respiratory failure on oxygen , obstructive sleep apnea on CPAP. History of lung nodule followed on serial imaging . Recent hospital admission for traumatic left sided hemothorax (mechanical fall)-admitted May 2025 status post chest tube drainage.  Synopsis 83 year old male former smoker followed for very severe COPD, chronic hypoxic respiratory failure on oxygen , obstructive sleep apnea on CPAP.  History of lung nodule followed on serial imaging. Traumatic left-sided hemothorax (mechanical fall)-admitted May 2025 status post chest tube drainage Medical history significant for rheumatoid arthritis, diabetes, congestive heart failure, coronary artery disease, chronic kidney disease, atrial fibrillation with previous use of amiodarone -stopped in August 2019.  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 Presents for acute visit. He has had worsening shortness of breath x 3 days .  He was last seen in the office 01/07/2024.  At that time he had chest x-ray showing a new left upper lobe opacity that was concerning for pneumonia and a small left pleural effusion.  He was started on a 10-day course of Augmentin  .  He has subsequently increased his oxygen  to 4 L nasal cannula due to worsening shortness of breath.  He has also noted increased swelling in his ankles bilaterally despite diuretic. He has had an increase in his weight over the last few days. He has accessory muscle use, he is using  pursed lip breathing, and he has sternal retractions. He gets short  of breath with conversation. He is wearing his continuous oxygen  at 4 L, and sat is 96% on the forehead probe.  CXR done this morning shows bilateral pleural effusions left greater than right, and some new infiltrates despite recent treatment with Augmentin  x 10 days . The CXR has not been read, but I have personally reviewed the imaging.[name] was in the hospital in May with a left hemothorax. I am unsure if this re-accumulation vs a pleural effusion/ pneumonia. He had a Chest tube placed in May to drain. Unsure if he will need thoracentesis today after ED eval.He is very diminished per auscultation , and I do hear afew crackles.  I am also concerned there could be a contributing cardiac issue, possible demand related ischemia. He states he felt a misfire yesterday, and has had some chest pain. He is also having swelling in his right ankle, which he does not usually have.  He denies any fever or hemoptysis.  We will send patient to the ER as he appears weak and is having progressive shortness of breath.  Saturations at this time are adequate on the 4 L nasal cannula.  I asked patient and his daughter  to go to the ED at [facility], but they feel more comfortable at [facility]. If there is a heart issue, they understand he will need to be transferred. We offered EMS, but they preferred to go by car. He has adequate oxygen  saturations, and they are going directly to the ED. I have called the ED,and I have spoken to the [employer]. She is aware he is  on the way.   Test Results: 01/19/2024 CXR Personally reviewed by me, not yet read by radiology. Stable right-sided pleural plaques. Mildly increased bibasilar lung opacities are noted concerning for infiltrates or atelectasis. Small pleural effusions R>L.  Previous testing PSG 04/2014 showed severe OSA, AHI 48/h with nadir desatn 78% correctd by CPAP 12 cm, 3 L O2 blended in , c flex +2 cm,  humidity, A medium full face mask was used. 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 PFT 10/2015 FEV1 was 36%, ratio 46, FVC 58%, DLCO 26%    CT chest 12/2016 -masslike consolidation in LUL   PET scan 03/20/17 >+metabolic act in LUL consolidation    CT chest 08/2017 >> 2.4 x 3.5 cm triangular subpleural opacity in the anterior left upper lobe > has resolved the platelike scarring bilateral nodular infiltrates stable,   CT chest 09/2020 severe emphysema, extensive pleural calcification.  Previous nodule in the right lower lobe has significantly decreased in size measuring 0.8 x 0.8 cm previously at 2.3 cm.  A stable left upper lobe cavitary nodule measuring 4 mm.  And a stable irregular nodule in the left lower lobe measuring 1.5 cm, and a left lower lobe nodule measuring 1.5 cm   CT chest October 22, 2021 that showed a stable uncomplicated aneurysmal dilatation along the aortic arch.  Interval increase of the abdominal aorta from 33 mm to 36 mm.,  Advanced emphysema.  Stable chronic extensive pleural calcifications, no worrisome pulmonary nodules.  Ill-defined left lower lobe pulmonary nodules unchanged since October 2020. Extensive cholelithiasis   CT chest 09/2022 Grossly stable 4.2 cm focal aneurysm seen involving aortic arch. Stable calcified pleural plaques and associated scarring are noted in right lung.Minimally displaced fractures are seen involving the left fourth, fifth, sixth and seventh ribs.   01/07/2024 Follow up  : Traumatic Hemothorax -Left sided, COPD, O2 RF  Patient presents for a 1 week follow-up.  Patient was recently hospitalized last month for a left-sided traumatic hemothorax.  Patient had a mechanical fall in late March-fell onto the left side.  On surveillance annual CT angio  chest November 17, 2023 showed interval development of a focal pleural thickening in the left superior hemothorax measuring 6.7 x 4.3 cm.  At that time there was concern for possible underlying malignancy.  Patient was set up for a PET scan.  Patient was seen in the office on Dec 03, 2023 with hemoptysis and acute respiratory distress.  Chest x-ray showed opacification of the left apex.  Patient was admitted to the hospital.  CT chest showed enlarging soft tissue density in the left apex.  Chest tube was placed by interventional radiology.  Patient has gradual clinical improvement and decreased left apical collection.  Patient was seen in the office last week, chest x-ray showed new left upper lobe opacity concerning for pneumonia and a left small pleural effusion.  Patient was started on a 10-day course of Augmentin  currently on day 9 out 10 .    Continues to have some ongoing shortness of breath.  Denies any hemoptysis. Feels okay but remains weak, has not regained his strength since discharge. Remains on O2 2l/m rest and 3l/m with activity. No increased oxygen  demands.  Chest x-ray today shows improved left upper lobe opacity, left apical pleural thickening and small  bilateral pleural effusions.          Latest Ref Rng & Units 01/19/2024   10:01 AM 12/15/2023    2:55 PM 12/09/2023    2:54 AM  CBC  WBC 4.0 - 10.5 K/uL 8.7  9.7  9.4   Hemoglobin 13.0 - 17.0 g/dL 87.2  88.3  87.0   Hematocrit 39.0 - 52.0 % 42.1  35.3  42.0   Platelets 150 - 400 K/uL 184  177.0  161        Latest Ref Rng & Units 01/19/2024   10:01 AM 12/15/2023    2:55 PM 12/09/2023    2:54 AM  BMP  Glucose 70 - 99 mg/dL 712  780  867   BUN 8 - 23 mg/dL 22  33  55   Creatinine 0.61 - 1.24 mg/dL 8.53  8.66  8.34   Sodium 135 - 145 mmol/L 137  137  140   Potassium 3.5 - 5.1 mmol/L 4.7  5.0  4.5   Chloride 98 - 111 mmol/L 96  98  101   CO2 22 - 32 mmol/L 28  26  28    Calcium  8.9 - 10.3 mg/dL 8.6  8.9  9.4     BNP    Component  Value Date/Time   BNP 285.1 (H) 01/19/2024 1000    ProBNP    Component Value Date/Time   PROBNP 188 10/20/2016 1038   PROBNP 59.9 05/04/2011 0517    PFT    Component Value Date/Time   FEV1PRE 1.28 11/16/2015 0955   FEV1POST 1.33 11/16/2015 0955   FVCPRE 2.96 11/16/2015 0955   FVCPOST 2.89 11/16/2015 0955   TLC 6.31 12/30/2013 0945   DLCOUNC 9.88 11/16/2015 0955   PREFEV1FVCRT 43 11/16/2015 0955   PSTFEV1FVCRT 46 11/16/2015 0955    DG Chest 2 View Result Date: 01/19/2024 CLINICAL DATA:  Shortness of breath, weight gain. EXAM: CHEST - 2 VIEW COMPARISON:  January 07, 2024. FINDINGS: Stable cardiomediastinal silhouette. Left-sided pacemaker is unchanged. Extensive right pleural calcifications are noted suggesting asbestos exposure. Small bilateral pleural effusions are noted. Bilateral lung opacities are noted concerning for infiltrates or atelectasis. Bony thorax is unremarkable. IMPRESSION: Stable right-sided pleural plaques. Mildly increased bibasilar lung opacities are noted concerning for infiltrates or atelectasis. Small pleural effusions. Electronically Signed   By: Lynwood Landy Raddle M.D.   On: 01/19/2024 09:56   DG Chest 2 View Result Date: 01/07/2024 CLINICAL DATA:  COPD.  Pneumonia. EXAM: CHEST - 2 VIEW COMPARISON:  Chest radiograph dated 12/29/2023. FINDINGS: Improved left upper lobe opacity compared to prior radiograph. Left apical pleural thickening or loculated effusion. Small bilateral pleural effusions and bibasilar atelectasis or infiltrate. Calcified pleural plaques of the right lung. Stable cardiac silhouette. Left pectoral pacemaker device. No acute osseous pathology. IMPRESSION: 1. Improved left upper lobe opacity. 2. Small bilateral pleural effusions and bibasilar atelectasis or infiltrate. Electronically Signed   By: Vanetta Chou M.D.   On: 01/07/2024 13:37   DG Chest 2 View Result Date: 12/29/2023 CLINICAL DATA:  Left hemothorax. EXAM: CHEST - 2 VIEW COMPARISON:  Dec 08, 2023. FINDINGS: The heart size and mediastinal contours are within normal limits. Left-sided pacemaker is unchanged. Small left pleural effusion is noted with adjacent atelectasis or infiltrate. Minimal right pleural effusion is also noted with adjacent atelectasis. New left upper lobe opacity is noted concerning for pneumonia. The visualized skeletal structures are unremarkable. IMPRESSION: New left upper lobe opacity concerning for pneumonia. Small left pleural effusion is  noted with adjacent left basilar atelectasis or infiltrate. Small right pleural effusion with adjacent atelectasis. Followup PA and lateral chest X-ray is recommended in 3-4 weeks following trial of antibiotic therapy to ensure resolution and exclude underlying malignancy. Electronically Signed   By: Lynwood Landy Raddle M.D.   On: 12/29/2023 12:12   CUP PACEART REMOTE DEVICE CHECK Result Date: 12/24/2023 PPM Scheduled remote reviewed. Normal device function.  Presenting rhythm: AP-AS/VP. 2 VHR episodes, longest ii beats on 10/18/23 at 06:38, V>A conduction at 162 bpm. Next remote 91 days. MC, CVRS PPM Scheduled remote reviewed. Normal device function.  Presenting rhythm: AP-AS/VP. 2 VHR episodes, longest 11 beats on 10/18/23 at 06:38, V>A conduction at 162 bpm. Next remote 91 days. 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   DEGENERATIVE JOINT DISEASE 11/23/2009   DEPRESSION 11/23/2009   FATIGUE 11/23/2009   GAIT DISTURBANCE 12/10/2009   HEMOPTYSIS UNSPECIFIED 05/07/2010   High cholesterol    HYPERTENSION 07/30/2009   HYPOTHYROIDISM 07/30/2009   Ischemic  cardiomyopathy 09/02/2017   LUMBAR RADICULOPATHY, 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   On home oxygen  therapy    2-3L; 24/7 (09/10/2016)   OSA on CPAP    Pneumonia    PTSD (post-traumatic stress disorder) 03/10/2012   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   RA (rheumatoid arthritis) (HCC) 06/11/2011   qwhere (09/10/2016)   RESPIRATORY FAILURE, CHRONIC 07/31/2009   Scleritis of both eyes 03/17/2014   Thrombocytopenia (HCC)    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   Type II diabetes mellitus (HCC)      Social History   Tobacco Use   Smoking status: Former    Current packs/day: 0.00    Average packs/day: 2.5 packs/day for 40.0 years (100.0 ttl pk-yrs)    Types: Cigarettes, Pipe, Cigars    Start date: 07/28/1958    Quit date: 07/28/1998    Years since quitting: 25.4    Passive exposure: Never   Smokeless tobacco: Never  Vaping Use   Vaping status: Never Used  Substance Use Topics   Alcohol  use: No    Alcohol /week: 0.0 standard drinks of alcohol    Drug use: No    [name] reports that he quit smoking about 25 years ago. His smoking use included cigarettes, pipe, and cigars. He started smoking about 65 years ago. He has a 100 pack-year smoking history. He has never been exposed to tobacco smoke. He has never used smokeless tobacco. He reports that he does not drink alcohol  and does not use drugs.  Tobacco Cessation: Former smoker with a 100 pack year smoking history   Past surgical hx, Family hx, Social hx all reviewed.  Current Outpatient Medications on File Prior to Visit  Medication Sig   acetaminophen  (TYLENOL ) 500 MG tablet Take 1,000 mg by mouth every 6 (six) hours as needed for mild pain.   albuterol  (PROVENTIL ) (2.5 MG/3ML) 0.083% nebulizer solution Take 3 mLs (2.5 mg total) by nebulization every 6 (six) hours as needed for wheezing or shortness of breath.   albuterol  (VENTOLIN  HFA) 108 (90 Base) MCG/ACT inhaler Inhale 1-2 puffs into the lungs every 6 (six) hours as needed for wheezing or  shortness of breath.    amoxicillin -clavulanate (AUGMENTIN ) 875-125 MG tablet Take 1 tablet by mouth 2 (two) times daily.   amoxicillin -clavulanate (AUGMENTIN ) 875-125 MG tablet Take 1 tablet by mouth 2 (two) times daily.   aspirin  81 MG chewable tablet Chew 81 mg by mouth daily. 12/30/23: Patient reports during [facility] call: [facility] provider instructed to start taking on 12/23/23 (verified through review of EHR) in light of Plavix  continued to being held post- recent hospital discharge on 12/08/23   atorvastatin  (LIPITOR ) 40 MG tablet Take 1 tablet (40 mg total) by mouth daily.   Carboxymethylcellul-Glycerin (LUBRICATING EYE DROPS OP) Place 1 drop into both eyes 4 (four) times daily as needed (dry eyes).    carvedilol  (COREG ) 3.125 MG tablet TAKE 1 TABLET(3.125 MG) BY MOUTH TWICE DAILY WITH A MEAL   Cholecalciferol  (VITAMIN D3) 2000 units capsule Take 2,000 Units by mouth daily.    cyanocobalamin  (VITAMIN B12) 500 MCG tablet Take 500 mcg by mouth daily.   empagliflozin  (JARDIANCE ) 10 MG TABS tablet Take 1 tablet (10 mg total) by mouth daily before breakfast.   ferrous sulfate  324 (65 Fe) MG TBEC Take 324 mg by mouth daily.   fluticasone  (FLONASE ) 50 MCG/ACT nasal spray Place 1 spray into both nostrils daily as needed for allergies.   furosemide  (LASIX ) 20 MG tablet Take 1 tablet (20 mg total) by mouth daily as needed.   guaifenesin  (HUMIBID E) 400 MG TABS tablet Take 400 mg by mouth in the morning and at bedtime.   imiquimod (ALDARA) 5 % cream Apply 1 Application topically 3 (three) times a week. APPLY TO WARTS 3-5 TIMES PER WEEK FOR 6 WEEKS OR UNTIL WARTS DISAPPEAR   insulin  aspart (NOVOLOG ) 100 UNIT/ML injection Inject 0-12 Units into the skin 3 (three) times daily before meals.   insulin  glargine (LANTUS ) 100 UNIT/ML injection Inject 20 Units into the skin daily.   levothyroxine  (SYNTHROID ) 125 MCG tablet Take 125 mcg by mouth daily before breakfast.   losartan  (COZAAR ) 100 MG tablet Take 1 tablet (100 mg total) by mouth daily.    Multiple Vitamins-Minerals (MEGA MULTIVITAMIN FOR MEN PO) Take 1 tablet by mouth daily.   Olodaterol HCl 2.5 MCG/ACT AERS Inhale 2 puffs into the lungs daily at 12 noon.   OXYGEN  Inhale 2 L into the lungs continuous.   predniSONE  (DELTASONE ) 10 MG tablet Take 1 tablet (10 mg total) by mouth daily with breakfast.   pregabalin  (LYRICA ) 100 MG capsule Take 100 mg by mouth daily.   Semaglutide  (OZEMPIC , 1 MG/DOSE, [place]) Inject 1 mg into the skin every Friday.   sertraline  (ZOLOFT ) 100 MG tablet Take 50 mg by mouth daily.   tamsulosin  (FLOMAX ) 0.4 MG CAPS capsule Take 1 capsule (0.4 mg total) by mouth at bedtime.   Tiotropium Bromide -Olodaterol (STIOLTO RESPIMAT ) 2.5-2.5 MCG/ACT AERS Inhale 2 puffs into the lungs daily.   traZODone  (DESYREL ) 50 MG tablet Take 50 mg by mouth at bedtime.   No current facility-administered medications on file prior to visit.     No Known Allergies  Review Of Systems:  Constitutional:   No  weight loss, night sweats,  Fevers, chills, fatigue, or  lassitude.  HEENT:   No headaches,  Difficulty swallowing,  Tooth/dental problems, or  Sore throat,                No sneezing, itching, ear ache, nasal congestion, post nasal drip,   CV:  + chest pain,  Orthopnea, PND, swelling in lower extremities, anasarca, dizziness,  palpitations, syncope.   GI  No heartburn, indigestion, abdominal pain, nausea, vomiting, diarrhea, change in bowel habits, loss of appetite, bloody stools.   Resp: ++ shortness of breath with exertion and  at rest.  + excess mucus, no productive cough,  No non-productive cough,  No coughing up of blood.  No change in color of mucus.  No wheezing.  No chest wall deformity  Skin: no rash or lesions.  GU: no dysuria, change in color of urine, no urgency or frequency.  No flank pain, no hematuria   MS:  No joint pain or swelling.  No decreased range of motion.  No back pain.  Psych:  No change in mood or affect. No depression or anxiety.  No memory  loss.   Vital Signs BP (!) 151/73 (BP Location: Right Arm, Patient Position: Sitting, Cuff Size: Normal)   Pulse 66   Ht 6' 2 (1.88 m)   Wt 201 lb (91.2 kg)   SpO2 96%   BMI 25.81 kg/m    Physical Exam:  General- No distress,  A&Ox3, pleasant elderly frail male in moderate distress ENT: No sinus tenderness, TM clear, pale nasal mucosa, no oral exudate,no post nasal drip, no LAN Cardiac: S1, S2, regular rate and rhythm, no murmur Chest: No wheeze/ rales/ dullness; + accessory muscle use, no nasal flaring, + sternal retractions, very diminished per bases bilaterally, crackles Abd.: Soft Non-tender, ND, BS +, Body mass index is 25.81 kg/m.  Ext: No clubbing cyanosis, bilateral lower extremity edema L>R Neuro: Physical deconditioning, in a wheelchair, alert and oriented x 3, moving all extremities x 4, appropriate on oxygen  at 4 L [place] Skin: No rashes, warm and dry, no obvious skin lesions Psych: Appropriately concerned   Assessment/Plan Severe COPD Acute on chronic hypoxic respiratory failure, on oxygen  3-day history of worsening shortness of breath Chest x-ray with pleural effusion bilaterally and questionable new infiltrates despite Augmentin  x 14 days from  6/3-6/17.>> failed OP therapy. OSA on CPAP BNP of 285.1 in the ED upon arrival Plan Your chest x ray shows fluid and or infiltrates  in your left and right lung. You need to go to the hospital, where they can drain the fluid if needed, and treat the pneumonia that is taking awhile to resolve. I am also concerned there may be an issue with your heart, as your weight has increased and you have new swelling in your ankles.  It is also concerning, you are not having to void more on your lasix . You have chosen to have your daughter drive you to the ER, which is what we recommend for care. Wear your oxygen  at 4 L as you have been doing until you get to the ED.  You will follow up here after your hospital visit with Madelin Stank  NP. Call us  of you need us .  Please contact office for sooner follow up if symptoms do not improve or worsen or seek emergency care    I spent 35 minutes dedicated to the care of this patient on the date of this encounter to include pre-visit review of records, face-to-face time with the patient discussing conditions above, post visit ordering of testing, clinical documentation with the electronic health record, making appropriate referrals as documented, and communicating necessary information to the patient's healthcare team.     Lauraine JULIANNA Lites, NP 01/19/2024  1:10 PM

## 2024-01-19 NOTE — ED Triage Notes (Signed)
 Pt sent from pulmonologist after having an x-ray showing fluid on both lungs. Pt has been unable to breathe for 4 days. Wears 2 L [place], but has had to increase to 4 L. Pt has had 10 pound weight gain in 1 week. Sats on arrival 79% on 4L [place].

## 2024-01-20 ENCOUNTER — Telehealth: Payer: Self-pay

## 2024-01-20 ENCOUNTER — Inpatient Hospital Stay (HOSPITAL_COMMUNITY)

## 2024-01-20 DIAGNOSIS — J9601 Acute respiratory failure with hypoxia: Secondary | ICD-10-CM

## 2024-01-20 DIAGNOSIS — J90 Pleural effusion, not elsewhere classified: Secondary | ICD-10-CM | POA: Diagnosis not present

## 2024-01-20 DIAGNOSIS — J449 Chronic obstructive pulmonary disease, unspecified: Secondary | ICD-10-CM | POA: Diagnosis not present

## 2024-01-20 LAB — BASIC METABOLIC PANEL WITH GFR
Anion gap: 11 (ref 5–15)
BUN: 23 mg/dL (ref 8–23)
CO2: 30 mmol/L (ref 22–32)
Calcium: 8.4 mg/dL — ABNORMAL LOW (ref 8.9–10.3)
Chloride: 94 mmol/L — ABNORMAL LOW (ref 98–111)
Creatinine, Ser: 1.36 mg/dL — ABNORMAL HIGH (ref 0.61–1.24)
GFR, Estimated: 52 mL/min — ABNORMAL LOW (ref >60)
Glucose, Bld: 140 mg/dL — ABNORMAL HIGH (ref 70–99)
Potassium: 4 mmol/L (ref 3.5–5.1)
Sodium: 135 mmol/L (ref 135–145)

## 2024-01-20 LAB — CBC
HCT: 42.6 % (ref 39.0–52.0)
Hemoglobin: 12.4 g/dL — ABNORMAL LOW (ref 13.0–17.0)
MCH: 26.6 pg (ref 26.0–34.0)
MCHC: 29.1 g/dL — ABNORMAL LOW (ref 30.0–36.0)
MCV: 91.4 fL (ref 80.0–100.0)
Platelets: 182 10*3/uL (ref 150–400)
RBC: 4.66 MIL/uL (ref 4.22–5.81)
RDW: 14.9 % (ref 11.5–15.5)
WBC: 8.4 10*3/uL (ref 4.0–10.5)
nRBC: 0 % (ref 0.0–0.2)

## 2024-01-20 LAB — GLUCOSE, CAPILLARY
Glucose-Capillary: 154 mg/dL — ABNORMAL HIGH (ref 70–99)
Glucose-Capillary: 226 mg/dL — ABNORMAL HIGH (ref 70–99)
Glucose-Capillary: 228 mg/dL — ABNORMAL HIGH (ref 70–99)
Glucose-Capillary: 187 mg/dL — ABNORMAL HIGH (ref 70–99)

## 2024-01-20 NOTE — Progress Notes (Signed)
 NAME:  [name], MRN:  [id number], DOB:  08-Feb-1941, LOS: 1 ADMISSION DATE:  01/19/2024, CONSULTATION DATE:  6/24 REFERRING MD:  Dr. 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  He was recently admitted to [facility] long with hypoxia following a fall and hemoptysis.  He is also found to have left-sided pleural effusion which was determined to be hemothorax after having a CT-guided chest tube placed.  Chest tube was removed on May 13 and he was deemed to be a candidate for discharge.  Since that time he has been doing pretty well nearly back to his recent baseline.  Then 6/20 he developed progressive shortness of breath.  Denies fever chills or productive cough.  Did notice some fluttering in his chest but no pain, pressure, or radiation.  He presented to the pulmonary clinic 6/24 after symptoms progressed to the point he was able to ambulate more than 15 feet.  Chest x-ray was obtained in the clinic and showed large left-sided pleural effusion.  He was referred to the emergency department for further evaluation.  Upon arrival to the emergency department he was started on BiPAP for work of breathing which helped quite a bit.  CT of the chest showed left greater than right pleural effusions with concern for pleural malignancy.  PCCM asked to evaluate for pleural effusion.  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), DEGENERATIVE JOINT DISEASE (11/23/2009), DEPRESSION (11/23/2009), FATIGUE (11/23/2009), GAIT DISTURBANCE (12/10/2009), HEMOPTYSIS UNSPECIFIED (05/07/2010), High cholesterol, HYPERTENSION (07/30/2009), HYPOTHYROIDISM (07/30/2009), Ischemic cardiomyopathy (09/02/2017), LUMBAR RADICULOPATHY, 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   Significant Hospital Events: Including procedures, antibiotic start and stop dates in addition to other pertinent events     Interim History / Subjective:  No overnight issues. Breathing feels much better.   Objective    Blood pressure (!) 140/63, pulse 69, temperature 98.6 F (37 C), temperature source Oral, resp. rate 18, height 6' 2 (1.88 m), weight 91.2 kg, SpO2 97%.    FiO2 (%):  [50 %-60 %] 60 %   Intake/Output Summary (Last 24 hours) at 01/20/2024 0859 Last data filed at 01/20/2024 0815 Gross per 24 hour  Intake --  Output 2700 ml  Net -2700 ml   Filed Weights   01/19/24 1008  Weight: 91.2 kg    Examination: Thin elderly man On face mask oxygen  Breath sounds diminished RRR Left sided chest tube opened to waterseal, no airleak   Pleural fluid studies reviewed Exudative based on protein and LDH criteria, lymphocyte predominant  Resolved problem list   Assessment and Plan   Recurrent Left pleural effusion Lymphocytic, exudative, reactive cells - concerning for malignancy given pleural based irregularities on CT Chest - follow cytology - chest tube placed to water seal, possibly will d/c today based on output.  - chest  xray this am shows trace left pleural effusion overall improved from initial imaging.  COPD  : not acutely exacerbated.  Acute on chronic hypoxemic respiratory failure - Supplemental O2 to keep sats 88-95% (on 2-3 L at home) - Continue home albuterol  - brovana  and yupelri in place of home Stiolto - On chronic prednisone  for ITP  Will follow. Potential discharge tomorrow   Verdon Gore, MD Pulmonary and Critical Care Medicine [facility] 01/20/2024 8:59 AM Pager: see AMION  If no response to pager, please call critical care on call (see AMION) until 7pm After 7:00 pm call Elink

## 2024-01-20 NOTE — Hospital Course (Signed)
 83 y.o. 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  During that hospitalization, he had CT-guided chest tube placement with pulmonology following.  Chest tube was removed on 5/13, and there were no malignant cells seen on cytology.  Patient states that overall he has been doing well since he was discharged from the hospital, has followed up as an outpatient with pulmonology.  He has been taking oral Lasix  at home, but for the last 3 days or so, he has had worsening dyspnea on exertion, and some left lower extremity swelling.  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

## 2024-01-20 NOTE — TOC Initial Note (Signed)
 Transition of Care [facility]) - Initial/Assessment Note   Patient Details  Name: [name] MRN: [id number] Date of Birth: 11-03-1940  Transition of Care [facility]) CM/SW Contact:    Duwaine GORMAN Aran, LCSW Phone Number: 01/20/2024, 1:21 PM  Clinical Narrative: Patient is from home. Patient uses 2-3L/min home oxygen  at baseline, but is currently requiring 11.5 NRB. TOC to follow for possible discharge needs.  Expected Discharge Plan: Home/Self Care Barriers to Discharge: Continued Medical Work up  Expected Discharge Plan and Services In-house Referral: Clinical Social Work  Prior Living Arrangements/Services Do you feel safe going back to the place where you live?: Yes      Need for Family Participation in Patient Care: No (Comment) Care giver support system in place?: Yes (comment) Current home services: DME (Home oxygen ) Criminal Activity/Legal Involvement Pertinent to Current Situation/Hospitalization: No - Comment as needed  Activities of Daily Living ADL Screening (condition at time of admission) Independently performs ADLs?: Yes (appropriate for developmental age) Does the patient have a NEW difficulty with bathing/dressing/toileting/self-feeding that is expected to last >3 days?: No Does the patient have a NEW difficulty with getting in/out of bed, walking, or climbing stairs that is expected to last >3 days?: No Does the patient have a NEW difficulty with communication that is expected to last >3 days?: No Is the patient deaf or have difficulty hearing?: No Does the patient have difficulty seeing, even when wearing glasses/contacts?: No Does the patient have difficulty concentrating, remembering, or making decisions?: No  Emotional Assessment Alcohol  / Substance Use: Not Applicable Psych Involvement: No (comment)  Admission diagnosis:  SOB (shortness of breath) [R06.02] Pleural effusion [J90] Recurrent left pleural effusion [J90] Acute hypoxic respiratory failure (HCC)  [J96.01] Patient Active Problem List   Diagnosis Date Noted   Recurrent left pleural effusion 01/19/2024   CAP (community acquired pneumonia) 01/08/2024   Hemothorax, traumatic, subsequent encounter 12/29/2023   Prostate cancer screening 12/15/2023   Hemothorax 12/08/2023   Need for management of chest tube 12/07/2023   Hemothorax on left 12/07/2023   Acute respiratory failure with hypoxia (HCC) 12/07/2023   Malnutrition of moderate degree 12/06/2023   Pleural effusion 12/04/2023   QT prolongation 12/03/2023   Rib fractures 10/27/2022   Allergic rhinitis 10/27/2022   Coronary artery disease involving coronary bypass graft of native heart with angina pectoris (HCC) 08/15/2022   S/P femoral-tibial bypass 03/04/2022   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   Oxygen  dependent 03/05/2021   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   Obesity 12/11/2020   Osteoarthritis 12/11/2020   Other long term (current) drug therapy 12/11/2020   Thrombocytopenia (HCC) 12/11/2020   Encounter for general adult medical examination with abnormal findings 11/05/2020   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   Claudication (HCC) 07/03/2020   Physical deconditioning 06/08/2020   Hemoptysis 02/16/2020   Syncope 10/04/2019   LBBB (left bundle branch block) 10/04/2019   Pacemaker 10/04/2019   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   Lung nodules 03/25/2019   AKI (acute kidney injury) (HCC) 02/12/2018   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   Symptomatic anemia 01/20/2018   BENIGN PROSTATIC HYPERTROPHY 12/2017   Chronic respiratory failure with hypoxia (HCC) 12/01/2017   Lung mass 12/01/2017   PAF (paroxysmal atrial fibrillation) (HCC)    Ischemic cardiomyopathy 09/02/2017   Chronic systolic CHF (congestive heart failure) (HCC) 09/02/2017   Aortic aneurysm (HCC) 09/02/2017   Diabetes mellitus with complication, with long-term  current use of  insulin  (HCC) 09/01/2017   Anxiety and depression 09/01/2017   COPD (chronic obstructive pulmonary disease) (HCC) 09/01/2017   CORONARY ARTERY DISEASE 08/28/2016   Myogenic ptosis of right eyelid 07/11/2016   Open angle with borderline findings, low risk, bilateral 07/11/2016   Bilateral dry eyes 04/14/2014   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/14/2014   Hyperlipidemia associated with type 2 diabetes mellitus (HCC) 01/05/2014   High risk medication use 10/07/2013   Pseudophakia of both eyes 10/07/2013   Drusen of left macula 12/01/2012   Scleritis of both eyes 12/01/2012   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   PTSD (post-traumatic stress disorder) 03/10/2012   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   Rheumatoid arthritis (HCC) 09/09/2011   OSA on CPAP 06/10/2011   Acute on chronic respiratory failure (HCC) 02/21/2011   GAIT DISTURBANCE 12/10/2009   Coronary atherosclerosis 11/23/2009   Hypothyroidism 07/30/2009   Essential hypertension 07/30/2009   PCP:  Rollene Almarie LABOR, MD Pharmacy:   [id number] - [place], [place] - [address] [place] [address] Phone: [phone] Fax: [phone]  Social Drivers of Health (SDOH) Social History: SDOH Screenings   Food Insecurity: No Food Insecurity (01/19/2024)  Housing: Low Risk  (01/19/2024)  Transportation Needs: No Transportation Needs (01/19/2024)  Utilities: Not At Risk (01/19/2024)  Alcohol  Screen: Low Risk  (01/11/2024)  Depression (PHQ2-9): Low Risk  (01/11/2024)  Financial Resource Strain: Low Risk  (01/11/2024)  Physical Activity: Insufficiently Active (01/11/2024)  Social Connections: Socially Isolated (01/19/2024)  Stress: No Stress Concern Present (01/11/2024)  Tobacco Use: Medium Risk (01/19/2024)  Health Literacy: Adequate Health Literacy (01/11/2024)   SDOH Interventions:    Readmission Risk  Interventions    12/06/2023    3:24 PM  Readmission Risk Prevention Plan  Transportation Screening Complete  PCP or Specialist Appt within 5-7 Days Complete  Home Care Screening Complete  Medication Review (RN CM) Complete

## 2024-01-20 NOTE — Progress Notes (Signed)
  Progress Note   Patient: [name] FMW:[id number] DOB: Nov 16, 1940 DOA: 01/19/2024     1 DOS: the patient was seen and examined on 01/20/2024   Brief hospital course: 83 y.o. 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  During that hospitalization, he had CT-guided chest tube placement with pulmonology following.  Chest tube was removed on 5/13, and there were no malignant cells seen on cytology.  Patient states that overall he has been doing well since he was discharged from the hospital, has followed up as an outpatient with pulmonology.  He has been taking oral Lasix  at home, but for the last 3 days or so, he has had worsening dyspnea on exertion, and some left lower extremity swelling.  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   Assessment and Plan: Acute on chronic hypoxic respiratory failure-in the setting of severe COPD, and recurrent moderate left pleural effusion of unclear etiology.  Could be related to heart failure, however with cardiophrenic angle bulky adenopathy there is concern for malignancy. -Pulmonary folloiwng for chest tube management, to leave waterseal today, possible pull tube tomorrow per Pulm   Acute on chronic heart failure with preserved EF-patient looks volume overloaded on exam, BNP elevated to 285.  Received IV Lasix  in the emergency department -Continue Coreg    Hypertension-Cozaar    CKD stage III-renal  function appears to be in the range of his normal baseline, will need to monitor closely in the setting of diuresis   Hypothyroidism-Synthroid    Hyperlipidemia-Lipitor    Type 2 diabetes -cont carb mod diet -Cont SSI as needed   Depression-Zoloft       Subjective: pt seen while eating lunch. Without complaints  Physical Exam: Vitals:   01/20/24 0534 01/20/24 0901 01/20/24 0905 01/20/24 1229  BP: (!) 140/63   (!) 83/47  Pulse: 69   63  Resp: 18   19  Temp: 98.6 F (37 C)   98.6 F (37 C)  TempSrc: Oral   Oral  SpO2: 97% 93% 93% 96%  Weight:      Height:       General exam: Awake, laying in bed, in nad Respiratory system: Normal respiratory effort, decreased BS Cardiovascular system: regular rate, s1, s2 Gastrointestinal system: Soft, nondistended, positive BS Central nervous system: CN2-12 grossly intact, strength intact Extremities: Perfused, no clubbing Skin: Normal skin turgor, no notable skin lesions seen Psychiatry: Mood normal // no visual hallucinations   Data Reviewed:  Labs reviewed: Na 135, K 4.0, Cr 1.36, WBC 6.4, Hgb 12.4, Plts 182  Family Communication: Pt in room, family not at bedside  Disposition: Status is: Inpatient Remains inpatient appropriate because: Severity of illness  Planned Discharge Destination: Home    Author: Garnette Pelt, MD 01/20/2024 5:20 PM  For on call review [email or web address].

## 2024-01-20 NOTE — Progress Notes (Signed)
   01/20/24 2200  BiPAP/CPAP/SIPAP  BiPAP/CPAP/SIPAP SERVO (air)  Reason BIPAP/CPAP not in use Other(comment) (pt does not want to use)  BiPAP/CPAP /SiPAP Vitals  Resp (!) 27  MEWS Score/Color  MEWS Score 2  MEWS Score Color Yellow

## 2024-01-20 NOTE — Plan of Care (Signed)

## 2024-01-21 ENCOUNTER — Other Ambulatory Visit (HOSPITAL_COMMUNITY): Payer: Self-pay

## 2024-01-21 ENCOUNTER — Inpatient Hospital Stay (HOSPITAL_COMMUNITY)

## 2024-01-21 ENCOUNTER — Telehealth (HOSPITAL_COMMUNITY): Payer: Self-pay | Admitting: Pharmacy Technician

## 2024-01-21 DIAGNOSIS — J9601 Acute respiratory failure with hypoxia: Secondary | ICD-10-CM | POA: Diagnosis not present

## 2024-01-21 DIAGNOSIS — J449 Chronic obstructive pulmonary disease, unspecified: Secondary | ICD-10-CM | POA: Diagnosis not present

## 2024-01-21 DIAGNOSIS — J90 Pleural effusion, not elsewhere classified: Secondary | ICD-10-CM | POA: Diagnosis not present

## 2024-01-21 LAB — COMPREHENSIVE METABOLIC PANEL WITH GFR
ALT: 13 U/L (ref 0–44)
AST: 21 U/L (ref 15–41)
Albumin: 2.5 g/dL — ABNORMAL LOW (ref 3.5–5.0)
Alkaline Phosphatase: 49 U/L (ref 38–126)
Anion gap: 9 (ref 5–15)
BUN: 27 mg/dL — ABNORMAL HIGH (ref 8–23)
CO2: 30 mmol/L (ref 22–32)
Calcium: 8.4 mg/dL — ABNORMAL LOW (ref 8.9–10.3)
Chloride: 97 mmol/L — ABNORMAL LOW (ref 98–111)
Creatinine, Ser: 1.59 mg/dL — ABNORMAL HIGH (ref 0.61–1.24)
GFR, Estimated: 43 mL/min — ABNORMAL LOW (ref >60)
Glucose, Bld: 163 mg/dL — ABNORMAL HIGH (ref 70–99)
Potassium: 4.5 mmol/L (ref 3.5–5.1)
Sodium: 136 mmol/L (ref 135–145)
Total Bilirubin: 1 mg/dL (ref 0.0–1.2)
Total Protein: 5.7 g/dL — ABNORMAL LOW (ref 6.5–8.1)

## 2024-01-21 LAB — GLUCOSE, CAPILLARY
Glucose-Capillary: 180 mg/dL — ABNORMAL HIGH (ref 70–99)
Glucose-Capillary: 256 mg/dL — ABNORMAL HIGH (ref 70–99)
Glucose-Capillary: 164 mg/dL — ABNORMAL HIGH (ref 70–99)
Glucose-Capillary: 242 mg/dL — ABNORMAL HIGH (ref 70–99)

## 2024-01-21 LAB — CYTOLOGY - NON PAP

## 2024-01-21 LAB — CBC
HCT: 40.1 % (ref 39.0–52.0)
Hemoglobin: 12 g/dL — ABNORMAL LOW (ref 13.0–17.0)
MCH: 27.4 pg (ref 26.0–34.0)
MCHC: 29.9 g/dL — ABNORMAL LOW (ref 30.0–36.0)
MCV: 91.6 fL (ref 80.0–100.0)
Platelets: 163 10*3/uL (ref 150–400)
RBC: 4.38 MIL/uL (ref 4.22–5.81)
RDW: 14.8 % (ref 11.5–15.5)
WBC: 8.5 10*3/uL (ref 4.0–10.5)
nRBC: 0 % (ref 0.0–0.2)

## 2024-01-21 MED ORDER — REVEFENACIN 175 MCG/3ML IN SOLN
175.0000 ug | Freq: Every day | RESPIRATORY_TRACT | 0 refills | Status: DC
  Filled 2024-01-21: qty 90, 30d supply, fill #0

## 2024-01-21 MED ORDER — CARVEDILOL 3.125 MG PO TABS
3.1250 mg | ORAL_TABLET | Freq: Every day | ORAL | Status: DC
Start: 2024-01-21 — End: 2024-01-23
  Administered 2024-01-21 – 2024-01-23 (×3): 3.125 mg via ORAL
  Filled 2024-01-21 (×3): qty 1

## 2024-01-21 MED ORDER — INSULIN GLARGINE-YFGN 100 UNIT/ML ~~LOC~~ SOPN: 10.0000 [IU] | PEN_INJECTOR | SUBCUTANEOUS | Status: DC

## 2024-01-21 MED ORDER — ATORVASTATIN CALCIUM 40 MG PO TABS
40.0000 mg | ORAL_TABLET | Freq: Every day | ORAL | Status: DC
  Administered 2024-01-21 – 2024-01-23 (×3): 40 mg via ORAL
  Filled 2024-01-21 (×3): qty 1

## 2024-01-21 MED ORDER — ARFORMOTEROL TARTRATE 15 MCG/2ML IN NEBU
15.0000 ug | INHALATION_SOLUTION | Freq: Two times a day (BID) | RESPIRATORY_TRACT | 0 refills | Status: DC
  Filled 2024-01-21: qty 60, 15d supply, fill #0

## 2024-01-21 MED ORDER — INSULIN GLARGINE-YFGN 100 UNIT/ML ~~LOC~~ SOLN
10.0000 [IU] | Freq: Every day | SUBCUTANEOUS | Status: DC
  Administered 2024-01-21 – 2024-01-23 (×3): 10 [IU] via SUBCUTANEOUS
  Filled 2024-01-21 (×3): qty 0.1

## 2024-01-21 MED ORDER — PREDNISONE 5 MG PO TABS
10.0000 mg | ORAL_TABLET | Freq: Every day | ORAL | Status: DC
  Administered 2024-01-21 – 2024-01-23 (×3): 10 mg via ORAL
  Filled 2024-01-21 (×3): qty 2

## 2024-01-21 MED ORDER — POLYVINYL ALCOHOL 1.4 % OP SOLN
1.0000 [drp] | OPHTHALMIC | Status: DC | PRN
  Filled 2024-01-21: qty 15

## 2024-01-21 MED ORDER — TAMSULOSIN HCL 0.4 MG PO CAPS
0.4000 mg | ORAL_CAPSULE | Freq: Every day | ORAL | Status: DC
  Administered 2024-01-21 – 2024-01-22 (×2): 0.4 mg via ORAL
  Filled 2024-01-21 (×2): qty 1

## 2024-01-21 NOTE — Plan of Care (Signed)
  Problem: Coping: Goal: Ability to adjust to condition or change in health will improve Outcome: Progressing   Problem: Clinical Measurements: Goal: Ability to maintain clinical measurements within normal limits will improve Outcome: Progressing   Problem: Safety: Goal: Ability to remain free from injury will improve Outcome: Progressing

## 2024-01-21 NOTE — Progress Notes (Signed)
 NAME:  [name], MRN:  [id number], DOB:  Apr 23, 1941, LOS: 2 ADMISSION DATE:  01/19/2024, CONSULTATION DATE:  6/24 REFERRING MD:  Dr. 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  He was recently admitted to [facility] long with hypoxia following a fall and hemoptysis.  He is also found to have left-sided pleural effusion which was determined to be hemothorax after having a CT-guided chest tube placed.  Chest tube was removed on May 13 and he was deemed to be a candidate for discharge.  Since that time he has been doing pretty well nearly back to his recent baseline.  Then 6/20 he developed progressive shortness of breath.  Denies fever chills or productive cough.  Did notice some fluttering in his chest but no pain, pressure, or radiation.  He presented to the pulmonary clinic 6/24 after symptoms progressed to the point he was able to ambulate more than 15 feet.  Chest x-ray was obtained in the clinic and showed large left-sided pleural effusion.  He was referred to the emergency department for further evaluation.  Upon arrival to the emergency department he was started on BiPAP for work of breathing which helped quite a bit.  CT of the chest showed left greater than right pleural effusions with concern for pleural malignancy.  PCCM asked to evaluate for pleural effusion.  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), DEGENERATIVE JOINT DISEASE (11/23/2009), DEPRESSION (11/23/2009), FATIGUE (11/23/2009), GAIT DISTURBANCE (12/10/2009), HEMOPTYSIS UNSPECIFIED (05/07/2010), High cholesterol, HYPERTENSION (07/30/2009), HYPOTHYROIDISM (07/30/2009), Ischemic cardiomyopathy (09/02/2017), LUMBAR RADICULOPATHY, 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   Significant Hospital Events: Including procedures, antibiotic start and stop dates in addition to other pertinent events     Interim History / Subjective:  Feeling ok  Objective    Blood pressure 101/63, pulse 79, temperature 99.1 F (37.3 C), temperature source Oral, resp. rate 17, height 6' 2 (1.88 m), weight 91.2 kg, SpO2 95%.        Intake/Output Summary (Last 24 hours) at 01/21/2024 0958 Last data filed at 01/21/2024 0815 Gross per 24 hour  Intake 10 ml  Output 1035 ml  Net -1025 ml   Filed Weights   01/19/24 1008  Weight: 91.2 kg    Examination: Thin elderly, chronically ill appearing man On nasal cannula Breath sounds diminished bilaterally no wheeze RRR no mrg No peripheral edema Thin extremities   Pleural fluid studies reviewed Exudative based on protein and LDH criteria, lymphocyte predominant  Resolved problem list   Assessment and Plan   Recurrent Left pleural effusion Lymphocytic, exudative, reactive cells - concerning for malignancy given pleural based irregularities on CT Chest - follow cytology which is in process - chest tube output has decreased, ok to d/c tube - he has a follow up with our office in July and  he should have an xray beforehand. If recurrence of effusion and if cytology is  positive recommend either repeat thoracentesis for a 3rd time vs proceeding with pleurx catheter placement  COPD : not acutely exacerbated.  Acute on chronic hypoxemic respiratory failure - Supplemental O2 to keep sats 88-95% (on 2-3 L at home) - Continue home albuterol  - brovana  and yupelri in place of home Stiolto - On chronic prednisone  for ITP  No objection to discharge from pulmonary perspective  Verdon Gore, MD Pulmonary and Critical Care Medicine [facility] 01/21/2024 9:58 AM Pager: see AMION  If no response to pager, please call critical care on call (see AMION) until 7pm After 7:00 pm call Elink

## 2024-01-21 NOTE — Progress Notes (Signed)
 TOC med in a secure bag obtained from patient and delivered to inpatient pharmacy due to need for refrigeration. TOC pharmacy updated on pt's discharge being canceled - ok to deliver Yulperi via meds to bed if patient is discharged home from [facility]. Patient verbalized an understanding  of med process.

## 2024-01-21 NOTE — Progress Notes (Signed)
 Pt states, My daughter is picking up my O2 tank from home. It goes up to 6 liters O2. My tanks at home are the large ones. They go up to 10 liters. I have two new tanks at home, 100 gallons each My concentrator goes up to 6 liters. The company that refills the large tanks is 15 minutes from my house O2 sat on 11 liters HFNC is 93 % &  on 6 liters [place] iit is 88-89%, patient's baseline. Dr Cindy and primary nurse updated via secure chat.

## 2024-01-21 NOTE — Progress Notes (Signed)
  Progress Note   Patient: [name] FMW:[id number] DOB: [birth year] DOA: 01/19/2024     2 DOS: the patient was seen and examined on 01/21/2024   Brief hospital course: 83 y.o. 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  During that hospitalization, he had CT-guided chest tube placement with pulmonology following.  Chest tube was removed on 5/13, and there were no malignant cells seen on cytology.  Patient states that overall he has been doing well since he was discharged from the hospital, has followed up as an outpatient with pulmonology.  He has been taking oral Lasix  at home, but for the last 3 days or so, he has had worsening dyspnea on exertion, and some left lower extremity swelling.  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   Assessment and Plan: Acute on chronic hypoxic respiratory failure-in the setting of severe COPD, and recurrent moderate left pleural effusion of unclear etiology.  Could be related to heart failure, however with cardiophrenic angle bulky adenopathy there is concern for malignancy. -Pulmonary folloiwng. Chest tube removed 6/26 -Pt remained on 11L O2. Per Pulm, cont to monitor for now. Pulm to continue to follow   Acute on chronic heart failure with preserved EF-patient looks volume overloaded on exam, BNP elevated to 285.  Received IV Lasix  in the emergency department -Continue Coreg    Hypertension-Cozaar     CKD stage III-renal function appears to be in the range of his normal baseline, will need to monitor closely in the setting of diuresis   Hypothyroidism-Synthroid    Hyperlipidemia-Lipitor    Type 2 diabetes -cont carb mod diet -Cont SSI as needed   Depression-Zoloft       Subjective: Feeling better  Physical Exam: Vitals:   01/21/24 0851 01/21/24 1232 01/21/24 1647 01/21/24 1654  BP: 101/63 (!) 116/57    Pulse: 79 61    Resp:  20    Temp: 99.1 F (37.3 C) 98.6 F (37 C)    TempSrc: Oral Oral    SpO2: 95% 96% (!) 82% 90%  Weight:      Height:       General exam: Conversant, in no acute distress Respiratory system: normal chest rise, clear, no audible wheezing Cardiovascular system: regular rhythm, s1-s2 Gastrointestinal system: Nondistended, nontender, pos BS Central nervous system: No seizures, no tremors Extremities: No cyanosis, no joint deformities Skin: No rashes, no pallor Psychiatry: Affect normal // no auditory hallucinations   Data Reviewed:  Labs reviewed: Na 136, K 4.5, K 1.59, WBC 8.5, Hgb 12.0, Plts 163  Family Communication: Pt in room, family not at bedside  Disposition: Status is: Inpatient Remains inpatient appropriate because: Severity of illness  Planned Discharge Destination: Home    Author: Garnette Pelt, MD 01/21/2024 5:43 PM  For on call review [email or web address].

## 2024-01-21 NOTE — Telephone Encounter (Signed)
 Pharmacy Patient Advocate Encounter   Received notification from Inpatient Request that prior authorization for Yupelri 175MCG/3ML solution is required/requested.   Insurance verification completed.   The patient is insured through [place] .   Per test claim: PA required; PA submitted to above mentioned insurance via CoverMyMeds Key/confirmation #/EOC [id number] Status is pending

## 2024-01-21 NOTE — Telephone Encounter (Signed)
 Pharmacy Patient Advocate Encounter  Received notification from [employer] that Prior Authorization for Yupelri 175MCG/3ML solution  has been DENIED.  Full denial letter will be uploaded to the media tab. See denial reason below. Denied under Medicare Part D coverage but is covered under Medicare Part B Coverage  PA #/Case ID/Reference #: [id number]

## 2024-01-21 NOTE — Progress Notes (Signed)
 TOC med delivered to pt in a secure bag by this RN. [name] needs prior authorization to be filled. Pt  requested med be sent to [facility].

## 2024-01-22 ENCOUNTER — Ambulatory Visit: Payer: Self-pay | Admitting: Cardiology

## 2024-01-22 ENCOUNTER — Other Ambulatory Visit: Payer: Self-pay

## 2024-01-22 ENCOUNTER — Inpatient Hospital Stay (HOSPITAL_COMMUNITY)

## 2024-01-22 ENCOUNTER — Other Ambulatory Visit (HOSPITAL_COMMUNITY): Payer: Self-pay

## 2024-01-22 DIAGNOSIS — J9601 Acute respiratory failure with hypoxia: Secondary | ICD-10-CM | POA: Diagnosis not present

## 2024-01-22 DIAGNOSIS — J90 Pleural effusion, not elsewhere classified: Secondary | ICD-10-CM | POA: Diagnosis not present

## 2024-01-22 LAB — GLUCOSE, CAPILLARY
Glucose-Capillary: 148 mg/dL — ABNORMAL HIGH (ref 70–99)
Glucose-Capillary: 272 mg/dL — ABNORMAL HIGH (ref 70–99)
Glucose-Capillary: 256 mg/dL — ABNORMAL HIGH (ref 70–99)
Glucose-Capillary: 187 mg/dL — ABNORMAL HIGH (ref 70–99)

## 2024-01-22 LAB — COMPREHENSIVE METABOLIC PANEL WITH GFR
ALT: 17 U/L (ref 0–44)
AST: 22 U/L (ref 15–41)
Albumin: 2.8 g/dL — ABNORMAL LOW (ref 3.5–5.0)
Alkaline Phosphatase: 52 U/L (ref 38–126)
Anion gap: 8 (ref 5–15)
BUN: 33 mg/dL — ABNORMAL HIGH (ref 8–23)
CO2: 30 mmol/L (ref 22–32)
Calcium: 8.4 mg/dL — ABNORMAL LOW (ref 8.9–10.3)
Chloride: 99 mmol/L (ref 98–111)
Creatinine, Ser: 1.48 mg/dL — ABNORMAL HIGH (ref 0.61–1.24)
GFR, Estimated: 47 mL/min — ABNORMAL LOW (ref >60)
Glucose, Bld: 138 mg/dL — ABNORMAL HIGH (ref 70–99)
Potassium: 4.2 mmol/L (ref 3.5–5.1)
Sodium: 137 mmol/L (ref 135–145)
Total Bilirubin: 0.8 mg/dL (ref 0.0–1.2)
Total Protein: 6 g/dL — ABNORMAL LOW (ref 6.5–8.1)

## 2024-01-22 LAB — CBC
HCT: 40.3 % (ref 39.0–52.0)
Hemoglobin: 12 g/dL — ABNORMAL LOW (ref 13.0–17.0)
MCH: 27.3 pg (ref 26.0–34.0)
MCHC: 29.8 g/dL — ABNORMAL LOW (ref 30.0–36.0)
MCV: 91.6 fL (ref 80.0–100.0)
Platelets: 150 10*3/uL (ref 150–400)
RBC: 4.4 MIL/uL (ref 4.22–5.81)
RDW: 14.5 % (ref 11.5–15.5)
WBC: 7.8 10*3/uL (ref 4.0–10.5)
nRBC: 0 % (ref 0.0–0.2)

## 2024-01-22 MED ORDER — ALUM & MAG HYDROXIDE-SIMETH 200-200-20 MG/5ML PO SUSP
30.0000 mL | ORAL | Status: DC | PRN
  Administered 2024-01-22: 30 mL via ORAL
  Filled 2024-01-22: qty 30

## 2024-01-22 NOTE — Progress Notes (Signed)
 Cxr reivweed Hard to say if there is change or not  Keep through 01/23/24 Wean fio2 for piulse ox > 90%    SIGNATURE    Dr. Dorethia Cave, M.D., F.C.C.P,  Pulmonary and Critical Care Medicine Staff Physician, [facility] Center Director - Interstitial Lung Disease  Program  Pulmonary Fibrosis [facility] at [facility] [place], [place], [address]   Pager: [phone], If no answer  -> Check AMION or Try [phone] Telephone (clinical office): [phone] Telephone (research): [phone]  6:23 PM 01/22/2024

## 2024-01-22 NOTE — Progress Notes (Signed)
  Progress Note   Patient: [name] FMW:[id number] DOB: 11/12/1940 DOA: 01/19/2024     3 DOS: the patient was seen and examined on 01/22/2024   Brief hospital course: 83 y.o. 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  During that hospitalization, he had CT-guided chest tube placement with pulmonology following.  Chest tube was removed on 5/13, and there were no malignant cells seen on cytology.  Patient states that overall he has been doing well since he was discharged from the hospital, has followed up as an outpatient with pulmonology.  He has been taking oral Lasix  at home, but for the last 3 days or so, he has had worsening dyspnea on exertion, and some left lower extremity swelling.  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   Assessment and Plan: Acute on chronic hypoxic respiratory failure-in the setting of severe COPD, and recurrent moderate left pleural effusion of unclear etiology.  Could be related to heart failure, however with cardiophrenic angle bulky adenopathy there is concern for malignancy. -Pulmonary folloiwng. Chest tube removed 6/26 -Pt remained on 10L this AM, weaned to 4L this afternoon. Per Pulm, re-evaluate 6/28   Acute on chronic heart failure with preserved EF-patient looks volume overloaded on exam, BNP elevated to 285.  Received IV Lasix  in the emergency department -Continue Coreg    Hypertension-Cozaar     CKD stage III-renal function appears to be in the range of his normal baseline, will need to monitor closely in the setting of diuresis   Hypothyroidism-Synthroid    Hyperlipidemia-Lipitor    Type 2 diabetes -cont carb mod diet -Cont SSI as needed   Depression-Zoloft       Subjective: Hoping to go home soon  Physical Exam: Vitals:   01/22/24 1357 01/22/24 1439 01/22/24 1647 01/22/24 1704  BP: (!) 95/48     Pulse: (!) 58     Resp: 17     Temp: 99.1 F (37.3 C)     TempSrc: Oral     SpO2: 96% 94% 94% 94%  Weight:      Height:       General exam: Awake, laying in bed, in nad Respiratory system: Normal respiratory effort, decreased BS Cardiovascular system: regular rate, s1, s2 Gastrointestinal system: Soft, nondistended, positive BS Central nervous system: CN2-12 grossly intact, strength intact Extremities: Perfused, no clubbing Skin: Normal skin turgor, no notable skin lesions seen Psychiatry: Mood normal // no visual hallucinations   Data Reviewed:  Labs reviewed: Na 137, K 4.2, Cr 1.48, WBC 7.8, Hgb 12.0, Plts 150  Family Communication: Pt in room, family not at bedside  Disposition: Status is: Inpatient Remains inpatient appropriate because: Severity of illness  Planned Discharge Destination: Home    Author: Garnette Pelt, MD 01/22/2024 6:41 PM  For on call review [email or web address].

## 2024-01-22 NOTE — Progress Notes (Signed)
   01/22/24 2200  BiPAP/CPAP/SIPAP  Reason BIPAP/CPAP not in use Non-compliant (PATIENT NOT COMPLIANT. BIPAP NOT IN ROOM)  BiPAP/CPAP /SiPAP Vitals  Resp (!) 25  MEWS Score/Color  MEWS Score 1  MEWS Score Color [place]

## 2024-01-22 NOTE — Progress Notes (Signed)
   01/22/24 0017  BiPAP/CPAP/SIPAP  BiPAP/CPAP/SIPAP Pt Type Adult  BiPAP/CPAP/SIPAP SERVO  Reason BIPAP/CPAP not in use Non-compliant

## 2024-01-22 NOTE — Progress Notes (Signed)
 Second TOC med  in secure bag delivered to inpatient pharmacy  by this RN. Patient and primary nurse updated verbally of the med location.

## 2024-01-22 NOTE — Progress Notes (Signed)
 NAME:  [name], MRN:  [id number], DOB:  01/31/1941, LOS: 3 ADMISSION DATE:  01/19/2024, CONSULTATION DATE:  6/24 REFERRING MD:  Dr. 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  He was recently admitted to [facility] long with hypoxia following a fall and hemoptysis.  He is also found to have left-sided pleural effusion which was determined to be hemothorax after having a CT-guided chest tube placed.  Chest tube was removed on May 13 and he was deemed to be a candidate for discharge.  Since that time he has been doing pretty well nearly back to his recent baseline.  Then 6/20 he developed progressive shortness of breath.  Denies fever chills or productive cough.  Did notice some fluttering in his chest but no pain, pressure, or radiation.  He presented to the pulmonary clinic 6/24 after symptoms progressed to the point he was able to ambulate more than 15 feet.  Chest x-ray was obtained in the clinic and showed large left-sided pleural effusion.  He was referred to the emergency department for further evaluation.  Upon arrival to the emergency department he was started on BiPAP for work of breathing which helped quite a bit.  CT of the chest showed left greater than right pleural effusions with concern for pleural malignancy.  PCCM asked to evaluate for pleural effusion.  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), DEGENERATIVE JOINT DISEASE (11/23/2009), DEPRESSION (11/23/2009), FATIGUE (11/23/2009), GAIT DISTURBANCE (12/10/2009), HEMOPTYSIS UNSPECIFIED (05/07/2010), High cholesterol, HYPERTENSION (07/30/2009), HYPOTHYROIDISM (07/30/2009), Ischemic cardiomyopathy (09/02/2017), LUMBAR RADICULOPATHY, 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   Significant Hospital Events: Including procedures, antibiotic start and stop dates in addition to other pertinent events   01/19/2024- admit and ccm consult LEft pleural fluids - No malignant cells indentified (prior non diagnostic 12/04/23) 6/26- Chest tube removeed left side  Interim History / Subjective:    6/27 - STill needing high o2 pe rRN. Depsite chest tube removal needed > 10L [place]. Now down to 7L with poulse ox 97%. He wants to go home  Objective    Blood pressure 126/61, pulse 63, temperature 98.1 F (36.7 C), temperature source Oral, resp. rate (!) 25, height 6' 2 (1.88 m), weight 91.2 kg, SpO2 97%.        Intake/Output Summary (Last 24 hours) at 01/22/2024 1013 Last data filed at 01/22/2024 0827 Gross per 24 hour  Intake 720 ml  Output 750 ml  Net -30 ml   Filed Weights   01/19/24 1008  Weight: 91.2 kg    Examination: General: No distress. Sitting on chair . Talkking. Has o2 one. He is more frail than years ago Neuro: Alert and Oriented x 3. GCS 15. Speech normal Psych: Pleasant Resp:  Barrel Chest - yes.  Wheeze - no, Crackles - no, No overt  respiratory distress. LEFT AIR ENTRY SOME DIMINSIED CVS: Normal heart sounds. Murmurs - no Ext: Stigmata of Connective Tissue Disease - no HEENT: Normal upper airway. PEERL +.  No post nasal drip    Pleural fluid studies reviewed Exudative based on protein and LDH criteria, lymphocyte predominant  Resolved problem list   Assessment and Plan   Recurrent Left pleural effusion Lymphocytic, exudative, reactive cells - - concerning for malignancy given pleural based irregularities on CT Chest, chest tube dc 6/26 on left side   Plan  Get another cxrs 01/22/2024    COPD : not acutely exacerbated.  Acute on chronic hypoxemic respiratory failure - Supplemental O2 to keep sats 88-95% (on 2-3 L at home)  01/22/2024 0 needing 7L [place]  Plan  - goal pulse ox > 90% - RN asked to wean  - repeat cxr to understand higher than baseline hypoxemia - Continue home albuterol  - brovana  and yupelri  in place of home Stiolto - On chronic prednisone  for ITP      SIGNATURE    Dr. Dorethia Cave, M.D., F.C.C.P,  Pulmonary and Critical Care Medicine Staff Physician, [facility] Center Director - Interstitial Lung Disease  Program  Pulmonary Fibrosis [facility] Network at [facility] [place], [place], [address]   Pager: [phone], If no answer  -> Check AMION or Try [phone] Telephone (clinical office): [phone] Telephone (research): [phone]  11:14 AM 01/22/2024    LABS    PULMONARY No results for input(s): PHART, PCO2ART, PO2ART, HCO3, TCO2, O2SAT in the last 168 hours.  Invalid input(s): PCO2, PO2  CBC Recent Labs  Lab 01/20/24 0511 01/21/24 0457 01/22/24 0512  HGB 12.4* 12.0* 12.0*  HCT 42.6 40.1 40.3  WBC 8.4 8.5 7.8  PLT 182 163 150    COAGULATION No results for input(s): INR in the last 168 hours.  CARDIAC  No results for input(s): TROPONINI in the last 168 hours. No results for input(s): PROBNP in the last 168 hours.   CHEMISTRY Recent Labs  Lab 01/19/24 1001 01/20/24 0511 01/21/24 0457 01/22/24 0512  NA 137 135 136 137  K 4.7 4.0 4.5 4.2  CL 96* 94* 97* 99  CO2 28  30 30 30   GLUCOSE 287* 140* 163* 138*  BUN 22 23 27* 33*  CREATININE 1.46* 1.36* 1.59* 1.48*  CALCIUM  8.6* 8.4* 8.4* 8.4*   Estimated Creatinine Clearance: 44.7 mL/min (A) (by C-G formula based on SCr of 1.48 mg/dL (H)).   LIVER Recent Labs  Lab 01/19/24 1849 01/21/24 0457 01/22/24 0512  AST  --  21 22  ALT  --  13 17  ALKPHOS  --  49 52  BILITOT  --  1.0 0.8  PROT 7.4 5.7* 6.0*  ALBUMIN   --  2.5* 2.8*     INFECTIOUS No results for input(s): LATICACIDVEN, PROCALCITON in the last 168 hours.   ENDOCRINE CBG (last 3)  Recent Labs    01/21/24 1721 01/21/24 2155 01/22/24 0720  GLUCAP 164* 242* 148*         IMAGING x48h  - image(s) personally visualized  -   highlighted in bold DG Chest Port 1 View Result Date: 01/21/2024 CLINICAL DATA:  Recurrent left pleural effusion EXAM: PORTABLE CHEST 1 VIEW COMPARISON:  01/20/2024 FINDINGS: Stable right-sided volume loss and right basilar and left apical parenchymal scarring. Stable right upper lung zone pleural calcification. Tiny left pleural effusion. Stable biapical pleural thickening. No pneumothorax. Left subclavian dual  lead pacemaker in place with leads within the right atrium and right ventricle. Cardiac size within normal limits IMPRESSION: 1. Tiny left pleural effusion. 2. Stable right-sided volume loss and right basilar and left apical parenchymal scarring. Electronically Signed   By: Dorethia Molt M.D.   On: 01/21/2024 19:42

## 2024-01-23 DIAGNOSIS — J9601 Acute respiratory failure with hypoxia: Secondary | ICD-10-CM | POA: Diagnosis not present

## 2024-01-23 DIAGNOSIS — J90 Pleural effusion, not elsewhere classified: Secondary | ICD-10-CM | POA: Diagnosis not present

## 2024-01-23 LAB — COMPREHENSIVE METABOLIC PANEL WITH GFR
ALT: 16 U/L (ref 0–44)
AST: 23 U/L (ref 15–41)
Albumin: 2.9 g/dL — ABNORMAL LOW (ref 3.5–5.0)
Alkaline Phosphatase: 56 U/L (ref 38–126)
Anion gap: 10 (ref 5–15)
BUN: 34 mg/dL — ABNORMAL HIGH (ref 8–23)
CO2: 29 mmol/L (ref 22–32)
Calcium: 8.5 mg/dL — ABNORMAL LOW (ref 8.9–10.3)
Chloride: 97 mmol/L — ABNORMAL LOW (ref 98–111)
Creatinine, Ser: 1.34 mg/dL — ABNORMAL HIGH (ref 0.61–1.24)
GFR, Estimated: 53 mL/min — ABNORMAL LOW (ref >60)
Glucose, Bld: 308 mg/dL — ABNORMAL HIGH (ref 70–99)
Potassium: 4.9 mmol/L (ref 3.5–5.1)
Sodium: 136 mmol/L (ref 135–145)
Total Bilirubin: 0.6 mg/dL (ref 0.0–1.2)
Total Protein: 6.3 g/dL — ABNORMAL LOW (ref 6.5–8.1)

## 2024-01-23 LAB — BODY FLUID CULTURE W GRAM STAIN: Culture: NO GROWTH

## 2024-01-23 LAB — GLUCOSE, CAPILLARY
Glucose-Capillary: 291 mg/dL — ABNORMAL HIGH (ref 70–99)
Glucose-Capillary: 143 mg/dL — ABNORMAL HIGH (ref 70–99)

## 2024-01-23 NOTE — TOC Transition Note (Signed)
 Transition of Care [facility]) - Discharge Note   Patient Details  Name: [name] MRN: [id number] Date of Birth: Jul 14, 1941  Transition of Care [facility] CM/SW Contact:  Sonda Manuella Quill, RN Phone Number: 01/23/2024, 3:09 PM   Clinical Narrative:    D/C orders received; pt says his dtr will bring travel tank when she picks him up; no TOC needs.   Final next level of care: Home/Self Care Barriers to Discharge: No Barriers Identified   Patient Goals and CMS Choice            Discharge Placement                       Discharge Plan and Services Additional resources added to the After Visit Summary for   In-house Referral: Clinical Social Work                                   Social Drivers of Health (SDOH) Interventions SDOH Screenings   Food Insecurity: No Food Insecurity (01/19/2024)  Housing: Low Risk  (01/19/2024)  Transportation Needs: No Transportation Needs (01/19/2024)  Utilities: Not At Risk (01/19/2024)  Alcohol  Screen: Low Risk  (01/11/2024)  Depression (PHQ2-9): Low Risk  (01/11/2024)  Financial Resource Strain: Low Risk  (01/11/2024)  Physical Activity: Insufficiently Active (01/11/2024)  Social Connections: Socially Isolated (01/19/2024)  Stress: No Stress Concern Present (01/11/2024)  Tobacco Use: Medium Risk (01/19/2024)  Health Literacy: Adequate Health Literacy (01/11/2024)     Readmission Risk Interventions    12/06/2023    3:24 PM  Readmission Risk Prevention Plan  Transportation Screening Complete  PCP or Specialist Appt within 5-7 Days Complete  Home Care Screening Complete  Medication Review (RN CM) Complete

## 2024-01-23 NOTE — Discharge Summary (Signed)
 Physician Discharge Summary   Patient: [name] MRN: [id number] DOB: February 03, 1941  Admit date:     01/19/2024  Discharge date: 01/23/24  Discharge Physician: Garnette Pelt   PCP: Rollene Almarie LABOR, MD   Recommendations at discharge:    Follow up with PCP in 1-2 weeks Follow up with Pulmonary as already scheduled  Discharge Diagnoses: Principal Problem:   Recurrent left pleural effusion  Resolved Problems:   * No resolved hospital problems. *  Hospital Course: 83 y.o. 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  During that hospitalization, he had CT-guided chest tube placement with pulmonology following.  Chest tube was removed on 5/13, and there were no malignant cells seen on cytology.  Patient states that overall he has been doing well since he was discharged from the hospital, has followed up as an outpatient with pulmonology.  He has been taking oral Lasix  at home, but for the last 3 days or so, he has had worsening dyspnea on exertion, and some left lower extremity swelling.  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   Assessment and Plan:  Acute on chronic hypoxic respiratory failure-in the setting of severe COPD, and recurrent moderate left pleural effusion of unclear etiology.  Could be related to heart failure, however with cardiophrenic angle bulky adenopathy there is concern for malignancy. -Pulmonary folloiwng.  Chest tube removed 6/26 -O2 requirements peaked to 11L this visit. Eventually was able to wean to baseline O2 requirements   Acute on chronic heart failure with preserved EF-patient looks volume overloaded on exam, BNP elevated to 285.  Received IV Lasix  in the emergency department -Continued Coreg    Hypertension-Cozaar    CKD stage III-renal function appears to be in the range of his normal baseline, will need to monitor closely in the setting of diuresis   Hypothyroidism-Synthroid    Hyperlipidemia-Lipitor    Type 2 diabetes -cont carb mod diet -Cont SSI as needed this visit   Depression-continued Zoloft     Consultants: Pulmonary Procedures performed:   Disposition: Home Diet recommendation:  Regular diet DISCHARGE MEDICATION: Allergies as of 01/23/2024   No Known Allergies      Medication List     STOP taking these medications    amoxicillin -clavulanate 875-125 MG tablet Commonly known as: AUGMENTIN    imiquimod 5 % cream Commonly known as: ALDARA   Olodaterol HCl 2.5 MCG/ACT Aers   Stiolto Respimat  2.5-2.5 MCG/ACT Aers Generic drug: Tiotropium Bromide -Olodaterol       TAKE these medications    acetaminophen  500 MG tablet Commonly known as: TYLENOL  Take 1,000 mg by mouth every 6 (six) hours as needed for mild pain.   albuterol  (2.5 MG/3ML) 0.083% nebulizer solution Commonly known as: PROVENTIL  Take 3 mLs (2.5 mg total) by nebulization every 6 (six) hours as needed for wheezing or shortness of breath. What changed: Another medication with the same name was changed. Make sure you understand how and when to take each.   albuterol  108 (90 Base) MCG/ACT inhaler Commonly known as: VENTOLIN   HFA Inhale 1-2 puffs into the lungs every 6 (six) hours as needed for wheezing or shortness of breath. What changed: when to take this   arformoterol  15 MCG/2ML Nebu Commonly known as: BROVANA  Take 2 mLs (15 mcg total) by nebulization 2 (two) times daily.   aspirin  81  MG chewable tablet Chew 81 mg by mouth daily.   atorvastatin  40 MG tablet Commonly known as: Lipitor  Take 1 tablet (40 mg total) by mouth daily.   carvedilol  3.125 MG tablet Commonly known as: COREG  TAKE 1 TABLET(3.125 MG) BY MOUTH TWICE DAILY WITH A MEAL What changed: See the new instructions.   cyanocobalamin  500 MCG tablet Commonly known as: VITAMIN B12 Take 500 mcg by mouth daily.   empagliflozin  10 MG Tabs tablet Commonly known as: Jardiance  Take 1 tablet (10 mg total) by mouth daily before breakfast.   ferrous sulfate  324 (65 Fe) MG Tbec Take 324 mg by mouth daily with breakfast.   fluticasone  50 MCG/ACT nasal spray Commonly known as: FLONASE  Place 1 spray into both nostrils daily as needed for allergies.   furosemide  20 MG tablet Commonly known as: Lasix  Take 1 tablet (20 mg total) by mouth daily as needed. What changed: reasons to take this   guaifenesin  400 MG Tabs tablet Commonly known as: HUMIBID E Take 400 mg by mouth in the morning and at bedtime.   insulin  aspart 100 UNIT/ML injection Commonly known as: novoLOG  Inject 6-12 Units into the skin daily as needed for high blood sugar (INCREASE AS NEEDED/AS DIRECTED).   insulin  glargine 100 UNIT/ML injection Commonly known as: LANTUS  Inject 26 Units into the skin See admin instructions. Inject 26 units into the skin in the morning and evening   losartan  100 MG tablet Commonly known as: COZAAR  Take 1 tablet (100 mg total) by mouth daily.   LUBRICATING EYE DROPS OP Place 1 drop into both eyes 4 (four) times daily as needed (dry eyes).   MEGA MULTIVITAMIN FOR MEN PO Take 1 tablet by mouth daily with breakfast.   OXYGEN  Inhale 2-4 L/min into the lungs continuous.   OZEMPIC  (1 MG/DOSE) [place] Inject 1 mg into the skin every Friday.   predniSONE  10 MG tablet Commonly known as: DELTASONE  Take 1 tablet (10 mg total) by mouth daily with breakfast.   pregabalin  100 MG capsule Commonly known as: LYRICA  Take  100 mg by mouth at bedtime.   Synthroid  125 MCG tablet Generic drug: levothyroxine  Take 125 mcg by mouth daily before breakfast.   tamsulosin  0.4 MG Caps capsule Commonly known as: FLOMAX  Take 1 capsule (0.4 mg total) by mouth at bedtime.   traZODone  50 MG tablet Commonly known as: DESYREL  Take 50 mg by mouth at bedtime.   Vitamin D3 50 MCG (2000 UT) capsule Take 2,000 Units by mouth daily.   Yupelri  175 MCG/3ML nebulizer solution Generic drug: revefenacin  Take 3 mLs (175 mcg total) by nebulization daily. Notes to patient: Waiting on insurance to authorize - will be sent to [facility]        Follow-up Information     Parrett, Madelin RAMAN, NP. Go on 02/18/2024.   Specialty: Pulmonary Disease Why: chest xray beforehand Contact information: [address] [place] [address] [place] [address] [phone]                Discharge Exam: Fredricka Weights   01/19/24 1008  Weight: 91.2 kg   General exam: Awake, laying in bed, in nad Respiratory system: Normal respiratory effort, no  wheezing Cardiovascular system: regular rate, s1, s2 Gastrointestinal system: Soft, nondistended, positive BS Central nervous system: CN2-12 grossly intact, strength intact Extremities: Perfused, no clubbing Skin: Normal skin turgor, no notable skin lesions seen Psychiatry: Mood normal // no visual hallucinations   Condition at discharge: fair  The results of significant diagnostics from this hospitalization (including imaging, microbiology, ancillary and laboratory) are listed below for reference.   Imaging Studies: DG CHEST PORT 1 VIEW Result Date: 01/22/2024 CLINICAL DATA:  Left pleural effusion EXAM: PORTABLE CHEST 1 VIEW COMPARISON:  Chest radiograph dated 01/21/2024 FINDINGS: Lines/tubes: Left chest wall pacemaker leads project over the right atrium and terminates below the field of view. Lungs: Well inflated lungs. Increased patchy left basilar opacities. Pleura: Unchanged blunting of  bilateral costophrenic angles. Unchanged left apicolateral pleural thickening and right pleural calcification. Heart/mediastinum: The heart size and mediastinal contours are within normal limits. Bones: No acute osseous abnormality.  Unchanged left rib fractures. IMPRESSION: 1. Increased patchy left basilar opacities, which may represent atelectasis, aspiration, or pneumonia. 2. Unchanged blunting of bilateral costophrenic angles, which may represent pleural thickening/small pleural effusions. 3. 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 Electronically Signed   By: Limin  Xu M.D.   On: 01/22/2024 15:17   DG Chest Port 1 View Result Date: 01/21/2024 CLINICAL DATA:  Recurrent left pleural effusion EXAM: PORTABLE CHEST 1 VIEW COMPARISON:  01/20/2024 FINDINGS: Stable right-sided volume loss and right basilar and left apical parenchymal scarring. Stable right upper lung zone pleural calcification. Tiny left pleural effusion. Stable biapical pleural thickening. No pneumothorax. 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 Cardiac size within normal limits IMPRESSION: 1. Tiny left pleural effusion. 2. Stable right-sided volume loss and right basilar and left apical parenchymal scarring. Electronically Signed   By: Dorethia Molt M.D.   On: 01/21/2024 19:42   DG Chest Port 1 View Result Date: 01/20/2024 CLINICAL DATA:  Evaluate pleural effusion. EXAM: PORTABLE CHEST 1 VIEW COMPARISON:  01/19/2024 FINDINGS: Left chest wall pacer device noted with leads in the right atrial appendage and right ventricle. A pigtail thoracostomy tube is noted overlying the left lower lobe. Small residual pleural effusion noted with blunting of the left costophrenic angle. No significant pneumothorax noted. Unchanged pleural calcifications. Advanced changes of COPD/emphysema. Chronic pleuroparenchymal scarring with volume loss in the  right lower lung. Right pleural calcifications again seen. Multiple remote left rib fractures. IMPRESSION: 1. Unchanged position of left basilar pigtail thoracostomy tube. Small residual left pleural effusion. 2. No significant pneumothorax. 3. Advanced changes of COPD/emphysema. 4. Chronic pleuroparenchymal scarring with volume loss in the right lower lung. Electronically Signed   By: Waddell Calk M.D.   On: 01/20/2024 09:03   DG Chest Portable 1 View Result Date: 01/19/2024 CLINICAL DATA:  Post chest tube EXAM: PORTABLE CHEST 1 VIEW COMPARISON:  01/19/2024 FINDINGS: Interval placement of left basilar chest tube with evacuation of the left pleural effusion. No visible significant effusion currently. No pneumothorax. Calcified pleural plaques again noted on the right. Heart and mediastinal contours are stable. Left Port-A-Cath remains in place, unchanged. Bibasilar airspace opacities, favor atelectasis. IMPRESSION: Decreasing left effusion following left basilar chest tube placement. No pneumothorax. Bibasilar opacities, favor atelectasis. Electronically Signed   By: Franky Crease M.D.   On: 01/19/2024 17:34   CT Chest W Contrast Result Date: 01/19/2024 CLINICAL DATA:  Pneumonia suspected.  * Tracking Code: BO * EXAM: CT CHEST WITH CONTRAST TECHNIQUE: Multidetector  CT imaging of the chest was performed during intravenous contrast administration. RADIATION DOSE REDUCTION: This exam was performed according to the departmental dose-optimization program which includes automated exposure control, adjustment of the mA and/or kV according to patient size and/or use of iterative reconstruction technique. CONTRAST:  75mL OMNIPAQUE  IOHEXOL  300 MG/ML  SOLN COMPARISON:  Plain film of earlier today. Most recent chest CT 12/03/2023. FINDINGS: Cardiovascular: Pacer. Aneurysm off the inferior transverse aorta is similar at 3.9 cm on 15/2. No surrounding hemorrhage. Tortuous thoracic aorta. Normal heart size, without  pericardial effusion. Three vessel coronary artery calcification. No central pulmonary embolism, on this non-dedicated study. Pulmonary artery enlargement, outflow tract 3.8 cm. Mediastinum/Nodes: No mediastinal or hilar adenopathy. Lungs/Pleura: The left apical heterogeneous soft tissue density has been drained in the interval and most likely hematoma. Right-sided pleural calcified plaques. Irregular left-sided complex left pleural thickening, especially superiorly. Increased moderate volume left-sided pleural fluid inferiorly. Left-sided subpleural nodularity/adenopathy in the juxta cardiac space including at 1.9 cm on 47/2, 1.5 cm on the prior. No right-sided pleural fluid. Advanced centrilobular emphysema. Improved left apical aeration with residual scarring and volume loss. Worsened left lower lobe collapse/consolidation. Upper Abdomen: Numerous tiny gallstones. Normal imaged portions of the spleen, stomach, pancreas, adrenal glands. Upper pole left-sided caliectasis on 71/2. Aortic dilatation at the level of the left renal artery origin at 3.8 cm is similar. Musculoskeletal: Old left rib fractures. Minimally displaced posterior seventh left rib fracture. Mild compression deformity at T12 with minimal ventral canal encroachment, similar. IMPRESSION: 1. 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 Subpleural/left cardiophrenic angle bulky adenopathy, progressive. 2. Increased left pleural fluid with left lower lobe collapse/consolidation. Atelectasis and/or infection. 3. No convincing evidence of primary malignancy within the lungs. 4. Possible upper pole left-sided caliectasis. Correlate with abdominal symptoms and possibly dedicated imaging. 5. 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 6. Right-sided pleural calcified plaques, likely result of remote empyema or hemothorax. 7. Pulmonary artery enlargement suggests  pulmonary arterial hypertension. 8. Incidental findings, including: Aortic atherosclerosis (ICD10-I70.0), coronary artery atherosclerosis and emphysema (ICD10-J43.9). Pulmonary artery enlargement suggests pulmonary arterial hypertension. Cholelithiasis. Electronically Signed   By: Rockey Kilts M.D.   On: 01/19/2024 13:44   DG Chest 2 View Result Date: 01/19/2024 CLINICAL DATA:  Shortness of breath, weight gain. EXAM: CHEST - 2 VIEW COMPARISON:  January 07, 2024. FINDINGS: Stable cardiomediastinal silhouette. Left-sided pacemaker is unchanged. Extensive right pleural calcifications are noted suggesting asbestos exposure. Small bilateral pleural effusions are noted. Bilateral lung opacities are noted concerning for infiltrates or atelectasis. Bony thorax is unremarkable. IMPRESSION: Stable right-sided pleural plaques. Mildly increased bibasilar lung opacities are noted concerning for infiltrates or atelectasis. Small pleural effusions. Electronically Signed   By: Lynwood Landy Raddle M.D.   On: 01/19/2024 09:56   DG Chest 2 View Result Date: 01/07/2024 CLINICAL DATA:  COPD.  Pneumonia. EXAM: CHEST - 2 VIEW COMPARISON:  Chest radiograph dated 12/29/2023. FINDINGS: Improved left upper lobe opacity compared to prior radiograph. Left apical pleural thickening or loculated effusion. Small bilateral pleural effusions and bibasilar atelectasis or infiltrate. Calcified pleural plaques of the right lung. Stable cardiac silhouette. Left pectoral pacemaker device. No acute osseous pathology. IMPRESSION: 1. Improved left upper lobe opacity. 2. Small bilateral pleural effusions and bibasilar atelectasis or infiltrate. Electronically Signed   By: Vanetta Chou M.D.   On: 01/07/2024 13:37   DG Chest 2 View Result Date: 12/29/2023 CLINICAL DATA:  Left hemothorax. EXAM: CHEST - 2  VIEW COMPARISON:  Dec 08, 2023. FINDINGS: The heart size and mediastinal contours are within normal limits. Left-sided pacemaker is unchanged. Small left  pleural effusion is noted with adjacent atelectasis or infiltrate. Minimal right pleural effusion is also noted with adjacent atelectasis. New left upper lobe opacity is noted concerning for pneumonia. The visualized skeletal structures are unremarkable. IMPRESSION: New left upper lobe opacity concerning for pneumonia. Small left pleural effusion is noted with adjacent left basilar atelectasis or infiltrate. Small right pleural effusion with adjacent atelectasis. Followup PA and lateral chest X-ray is recommended in 3-4 weeks following trial of antibiotic therapy to ensure resolution and exclude underlying malignancy. Electronically Signed   By: Lynwood Landy Raddle M.D.   On: 12/29/2023 12:12   CUP PACEART REMOTE DEVICE CHECK Result Date: 12/24/2023 PPM Scheduled remote reviewed. Normal device function.  Presenting rhythm: AP-AS/VP. 2 VHR episodes, longest ii beats on 10/18/23 at 06:38, V>A conduction at 162 bpm. Next remote 91 days. MC, CVRS PPM Scheduled remote reviewed. Normal device function.  Presenting rhythm: AP-AS/VP. 2 VHR episodes, longest 11 beats on 10/18/23 at 06:38, V>A conduction at 162 bpm. Next remote 91 days. MC, CVRS   Microbiology: Results for orders placed or performed during the hospital encounter of 01/19/24  Body fluid culture w Gram Stain     Status: None   Collection Time: 01/19/24  5:00 PM   Specimen: Pleural Fluid  Result Value Ref Range Status   Specimen Description   Final    PLEURAL Performed at [facility], [address] [place], [place] [address]    Special Requests   Final    NONE Performed at [facility], [address] [place], [place] [address]    Gram Stain   Final    RARE WBC PRESENT,BOTH PMN AND MONONUCLEAR NO ORGANISMS SEEN    Culture   Final    NO GROWTH 3 DAYS Performed at [facility], [address], [place], [place] [address]    Report Status 01/23/2024 FINAL  Final   *Note: Due to a large number of  results and/or encounters for the requested time period, some results have not been displayed. A complete set of results can be found in Results Review.    Labs: CBC: Recent Labs  Lab 01/19/24 1001 01/20/24 0511 01/21/24 0457 01/22/24 0512  WBC 8.7 8.4 8.5 7.8  NEUTROABS 7.4  --   --   --   HGB 12.7* 12.4* 12.0* 12.0*  HCT 42.1 42.6 40.1 40.3  MCV 90.5 91.4 91.6 91.6  PLT 184 182 163 150   Basic Metabolic Panel: Recent Labs  Lab 01/19/24 1001 01/20/24 0511 01/21/24 0457 01/22/24 0512 01/23/24 0745  NA 137 135 136 137 136  K 4.7 4.0 4.5 4.2 4.9  CL 96* 94* 97* 99 97*  CO2 28 30 30 30 29   GLUCOSE 287* 140* 163* 138* 308*  BUN 22 23 27* 33* 34*  CREATININE 1.46* 1.36* 1.59* 1.48* 1.34*  CALCIUM  8.6* 8.4* 8.4* 8.4* 8.5*   Liver Function Tests: Recent Labs  Lab 01/19/24 1849 01/21/24 0457 01/22/24 0512 01/23/24 0745  AST  --  21 22 23   ALT  --  13 17 16   ALKPHOS  --  49 52 56  BILITOT  --  1.0 0.8 0.6  PROT 7.4 5.7* 6.0* 6.3*  ALBUMIN   --  2.5* 2.8* 2.9*   CBG: Recent Labs  Lab 01/22/24 1119 01/22/24 1629 01/22/24 2035 01/23/24 0733 01/23/24  1136  GLUCAP 272* 256* 187* 291* 143*    Discharge time spent: less than 30 minutes.  Signed: Garnette Pelt, MD [facility] 01/23/2024

## 2024-01-23 NOTE — Progress Notes (Signed)
 NAME:  [name], MRN:  [id number], DOB:  09-Apr-1941, LOS: 4 ADMISSION DATE:  01/19/2024, CONSULTATION DATE:  6/24 REFERRING MD:  Dr. 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  He was recently admitted to [facility] long with hypoxia following a fall and hemoptysis.  He is also found to have left-sided pleural effusion which was determined to be hemothorax after having a CT-guided chest tube placed.  Chest tube was removed on May 13 and he was deemed to be a candidate for discharge.  Since that time he has been doing pretty well nearly back to his recent baseline.  Then 6/20 he developed progressive shortness of breath.  Denies fever chills or productive cough.  Did notice some fluttering in his chest but no pain, pressure, or radiation.  He presented to the pulmonary clinic 6/24 after symptoms progressed to the point he was able to ambulate more than 15 feet.  Chest x-ray was obtained in the clinic and showed large left-sided pleural effusion.  He was referred to the emergency department for further evaluation.  Upon arrival to the emergency department he was started on BiPAP for work of breathing which helped quite a bit.  CT of the chest showed left greater than right pleural effusions with concern for pleural malignancy.  PCCM asked to evaluate for pleural effusion.  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), Anxiety, Aortic aneurysm (HCC) (09/02/2017), BENIGN PROSTATIC HYPERTROPHY (11/23/2009), Cardiomyopathy (HCC) (08/28/2016), Chronic systolic CHF (congestive heart failure) (HCC) (09/02/2017), COPD (chronic obstructive pulmonary disease) (HCC),  CORONARY ARTERY DISEASE (11/23/2009), DECREASED HEARING, LEFT EAR (03/01/2010), DEGENERATIVE JOINT DISEASE (11/23/2009), DEPRESSION (11/23/2009), FATIGUE (11/23/2009), GAIT DISTURBANCE (12/10/2009), HEMOPTYSIS UNSPECIFIED (05/07/2010), High cholesterol, HYPERTENSION (07/30/2009), HYPOTHYROIDISM (07/30/2009), Ischemic cardiomyopathy (09/02/2017), LUMBAR RADICULOPATHY, 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   Significant Hospital Events: Including procedures, antibiotic start and stop dates in addition to other pertinent events   01/19/2024- admit and ccm consult LEft pleural fluids - No malignant cells indentified (prior non diagnostic 12/04/23) LHD 406, 48% LYMPHS 6/26- Chest tube removeed left side  6/27 - STill needing high o2 pe rRN. Depsite chest tube removal needed > 10L [name]. Now down to 7L with poulse ox 97%. He wants to go home   Interim History / Subjective:   6/28 - down to 3L [place]. Sitting in chair. Wants to go  home  Objective    Blood pressure (!) 147/60, pulse 60, temperature 97.9 F (36.6 C), temperature source Oral, resp. rate (!) 25, height 6' 2 (1.88 m), weight 91.2 kg, SpO2 97%.        Intake/Output Summary (Last 24 hours) at 01/23/2024 1359 Last data filed at 01/23/2024 0517 Gross per 24 hour  Intake 480 ml  Output 650 ml  Net -170 ml   Filed Weights   01/19/24 1008  Weight: 91.2 kg    Examination: General: No distress. Looks some deconditoned and cachectic compred to 2017. Sitting in chair. Looks stble  Neuro: Alert and Oriented x 3. GCS 15. Speech normal Psych: Pleasant Resp:  Barrel Chest - YES.  Wheeze - n, Crackles - no, No overt respiratory distress CVS: Normal  heart sounds. Murmurs - no Ext: Stigmata of Connective Tissue Disease - no HEENT: Normal upper airway. PEERL +. No post nasal drip. HAS O@   Resolved problem list   Assessment and Plan   Recurrent Left pleural effusion Lymphocytic, exudative, reactive cells - - concerning for malignancy given pleural based irregularities on CT Chest, chest tube dc 6/26 on left side  6/28 - no evidence of recurrence on cXR yesterday and exam today  Plan Home 01/23/2024 OPD Fu as below    COPD : not acutely exacerbated.  Acute on chronic hypoxemic respiratory failure - Supplemental O2 to keep sats 88-95% (on 2-3 L at home)  01/23/2024  down to 3L [place] basel  Plan  - goal pulse ox > 90% - RN asked to wean  - repeat cxr to understand higher than baseline hypoxemia - Continue home albuterol  - brovana  and yupelri  in place of home Stiolto - On chronic prednisone  for ITP - evaluation for OTHUVARYE at office  fOLLOWUPO Future Appointments  Date Time Provider Department Center  02/02/2024  1:20 PM Rollene Almarie LABOR, MD [facility] None  02/18/2024 11:30 AM Parrett, Madelin RAMAN, NP [facility] None  02/23/2024  1:00 PM [facility] LAB [facility] None  02/23/2024  1:30 PM Loretha Ash, MD [facility] None  03/09/2024 10:40 AM Pietro Redell RAMAN, MD [facility]IGH[facility] None  03/29/2024  7:40 AM CVD HVT DEVICE REMOTES [facility] H&V  06/28/2024  7:10 AM CVD HVT DEVICE REMOTES [facility] H&V  09/27/2024  7:10 AM CVD HVT DEVICE REMOTES [facility] H&V  12/27/2024  7:10 AM CVD HVT DEVICE REMOTES [facility] H&V  01/10/2025 10:50 AM [facility] G[facility]LEY-ANNUAL WELLNESS VISIT 2 [facility] None     D/w Triad MD Dr Cindy ENGLAND    Dr. Dorethia Cave, M.D., F.C.C.P,  Pulmonary and Critical Care Medicine Staff Physician, [facility] Center Director - Interstitial Lung Disease  Program  Pulmonary Fibrosis [facility] at [facility] [place], [place], [address]   Pager: [phone], If no answer  -> Check  AMION or Try [phone] Telephone (clinical office): [phone] Telephone (research): [phone]  1:59 PM 01/23/2024    LABS    PULMONARY No results for input(s): PHART, PCO2ART, PO2ART, HCO3, TCO2, O2SAT in the last 168 hours.  Invalid input(s): PCO2, PO2  CBC Recent Labs  Lab 01/20/24 0511 01/21/24 0457 01/22/24 0512  HGB 12.4* 12.0* 12.0*  HCT 42.6 40.1 40.3  WBC 8.4 8.5 7.8  PLT 182 163 150    COAGULATION No results for input(s): INR in the last 168 hours.  CARDIAC  No results for input(s): TROPONINI in the last 168 hours. No results for input(s): PROBNP in the last 168 hours.   CHEMISTRY Recent Labs  Lab 01/19/24 1001 01/20/24 0511 01/21/24 0457 01/22/24 0512 01/23/24 0745  NA 137 135 136 137 136  K 4.7 4.0 4.5 4.2 4.9  CL 96* 94* 97* 99 97*  CO2 28 30 30 30 29   GLUCOSE 287* 140* 163* 138* 308*  BUN 22 23 27* 33* 34*  CREATININE 1.46* 1.36* 1.59* 1.48* 1.34*  CALCIUM  8.6* 8.4* 8.4* 8.4* 8.5*   Estimated Creatinine Clearance: 49.4 mL/min (A) (by C-G formula based on SCr of 1.34 mg/dL (H)).   LIVER Recent Labs  Lab 01/19/24 1849 01/21/24  9542 01/22/24 0512 01/23/24 0745  AST  --  21 22 23   ALT  --  13 17 16   ALKPHOS  --  49 52 56  BILITOT  --  1.0 0.8 0.6  PROT 7.4 5.7* 6.0* 6.3*  ALBUMIN   --  2.5* 2.8* 2.9*     INFECTIOUS No results for input(s): LATICACIDVEN, PROCALCITON in the last 168 hours.   ENDOCRINE CBG (last 3)  Recent Labs    01/22/24 2035 01/23/24 0733 01/23/24 1136  GLUCAP 187* 291* 143*         IMAGING x48h  - image(s) personally visualized  -   highlighted in bold DG CHEST PORT 1 VIEW Result Date: 01/22/2024 CLINICAL DATA:  Left pleural effusion EXAM: PORTABLE CHEST 1 VIEW COMPARISON:  Chest radiograph dated 01/21/2024 FINDINGS: Lines/tubes: Left chest wall pacemaker leads project over the right atrium and terminates below the field of view. Lungs: Well inflated lungs.  Increased patchy left basilar opacities. Pleura: Unchanged blunting of bilateral costophrenic angles. Unchanged left apicolateral pleural thickening and right pleural calcification. Heart/mediastinum: The heart size and mediastinal contours are within normal limits. Bones: No acute osseous abnormality.  Unchanged left rib fractures. IMPRESSION: 1. Increased patchy left basilar opacities, which may represent atelectasis, aspiration, or pneumonia. 2. Unchanged blunting of bilateral costophrenic angles, which may represent pleural thickening/small pleural effusions. 3. 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 Electronically Signed   By: Limin  Xu M.D.   On: 01/22/2024 15:17   DG Chest Port 1 View Result Date: 01/21/2024 CLINICAL DATA:  Recurrent left pleural effusion EXAM: PORTABLE CHEST 1 VIEW COMPARISON:  01/20/2024 FINDINGS: Stable right-sided volume loss and right basilar and left apical parenchymal scarring. Stable right upper lung zone pleural calcification. Tiny left pleural effusion. Stable biapical pleural thickening. No pneumothorax. 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 Cardiac size within normal limits IMPRESSION: 1. Tiny left pleural effusion. 2. Stable right-sided volume loss and right basilar and left apical parenchymal scarring. Electronically Signed   By: Dorethia Molt M.D.   On: 01/21/2024 19:42

## 2024-01-23 NOTE — Plan of Care (Signed)

## 2024-01-23 NOTE — Progress Notes (Signed)
 TOC meds x 2 in secure bags  retrieved from the inpatient pharmacy and delivered to pt in room by this RN

## 2024-01-25 ENCOUNTER — Other Ambulatory Visit (HOSPITAL_COMMUNITY): Payer: Self-pay

## 2024-01-25 ENCOUNTER — Telehealth: Payer: Self-pay | Admitting: *Deleted

## 2024-01-25 NOTE — Transitions of Care (Post Inpatient/ED Visit) (Signed)
   01/25/2024  Name: [name] MRN: [id number] DOB: 1941/07/11  Today's TOC FU Call Status: Today's TOC FU Call Status:: Unsuccessful Call (1st Attempt) Unsuccessful Call (1st Attempt) Date: 01/25/24  Attempted to reach the patient regarding the most recent Inpatient visit.  Left HIPAA compliant voice message requesting call back  Follow Up Plan: Additional outreach attempts will be made to reach the patient to complete the Transitions of Care (Post Inpatient/ED visit) call.   Pls call/ message for questions,  Yasemin Rabon Mckinney Bentzion Dauria, RN, BSN, CCRN Alumnus RN Care Manager  Transitions of Care  [facility] - [facility] [phone]: direct office

## 2024-01-26 ENCOUNTER — Telehealth: Payer: Self-pay | Admitting: *Deleted

## 2024-01-26 NOTE — Transitions of Care (Post Inpatient/ED Visit) (Signed)
 01/26/2024  Name: DERWARD MARPLE MRN: [id number] DOB: [birth year]  Today's TOC FU Call Status: Today's TOC FU Call Status:: Successful TOC FU Call Completed TOC FU Call Complete Date: 01/26/24 Patient's Name and Date of Birth confirmed.  Transition Care Management Follow-up Telephone Call Date of Discharge: 01/24/24 Discharge Facility: [facility]) Type of Discharge: Inpatient Admission Primary Inpatient Discharge Diagnosis:: Recurrent (L) pleural effusion; acute on chronic CHF How have you been since you were released from the hospital?: Better (I am doing fine; nothing has really changed; I don't want to go through these weekly calls again, they are too much.  I know you have my best interest in mind- I am doing what they tell me- the doctors need to figure out why this keeps happening and do something about it so it doesn't keep happening) Any questions or concerns?: No  Items Reviewed: Did you receive and understand the discharge instructions provided?: Yes (briefly reviewed with patient who verbalizes good understanding of same - he declines thorough review today, states on my way out the door to go to the [place] doctor appointment) Medications obtained,verified, and reconciled?: No Medications Not Reviewed Reasons::  (Patient declined full medication reconciliation/ review; reports obtained/ is taking all newly Rx'd medications as instructed; self-manages medications and denies questions/ concerns around medications today; he is on the way to [facility] medical appointment) Any new allergies since your discharge?: No Dietary orders reviewed?: Yes (Healthy as possible) Type of Diet Ordered:: Healthy as possible Do you have support at home?: Yes People in Home [RPT]: alone Name of Support/Comfort Primary Source: Reports independent in self-care activities; supportive local daughter continues to assist as/ if needed/ indicated  Medications Reviewed Today: Medications Reviewed Today      Reviewed by Richie Vadala M, RN (Registered Nurse) on 01/26/24 at 1322  Med List Status: <None>   Medication Order Taking? Sig Documenting Provider Last Dose Status Informant  acetaminophen  (TYLENOL ) 500 MG tablet [id number] No Take 1,000 mg by mouth every 6 (six) hours as needed for mild pain. [provider] Unknown Active Multiple Informants  albuterol  (PROVENTIL ) (2.5 MG/3ML) 0.083% nebulizer solution [id number] No Take 3 mLs (2.5 mg total) by nebulization every 6 (six) hours as needed for wheezing or shortness of breath. Parrett, Madelin RAMAN, NP 01/18/2024 Active Multiple Informants  albuterol  (VENTOLIN  HFA) 108 (90 Base) MCG/ACT inhaler [id number] No Inhale 1-2 puffs into the lungs every 6 (six) hours as needed for wheezing or shortness of breath.  Patient taking differently: Inhale 1-2 puffs into the lungs every 4 (four) hours as needed for wheezing or shortness of breath.   Parrett, Madelin RAMAN, NP Unknown Active Multiple Informants  arformoterol  (BROVANA ) 15 MCG/2ML NEBU [id number]  Take 2 mLs (15 mcg total) by nebulization 2 (two) times daily. Cindy Garnette POUR, MD  Active   aspirin  81 MG chewable tablet [id number] No Chew 81 mg by mouth daily. Rollene Almarie LABOR, MD 01/18/2024  9:00 AM Active Multiple Informants           Med Note MARISA, NATHANEL SAILOR   Tue Jan 19, 2024  4:42 PM)    atorvastatin  (LIPITOR ) 40 MG tablet [id number] No Take 1 tablet (40 mg total) by mouth daily. Okey Vina LULLA, MD 01/18/2024 Morning Active Multiple Informants  Carboxymethylcellul-Glycerin (LUBRICATING EYE DROPS OP) [id number] No Place 1 drop into both eyes 4 (four) times daily as needed (dry eyes).  [provider] Unknown Active Multiple Informants  carvedilol  (COREG ) 3.125 MG tablet [id number] No  TAKE 1 TABLET(3.125 MG) BY MOUTH TWICE DAILY WITH A MEAL  Patient taking differently: Take 3.125 mg by mouth in the morning.   Rollene Almarie LABOR, MD 01/18/2024  9:00 AM Active Multiple Informants  Cholecalciferol   (VITAMIN D3) 2000 units capsule [id number] No Take 2,000 Units by mouth daily.  [provider] 01/18/2024 Morning Active Multiple Informants  cyanocobalamin  (VITAMIN B12) 500 MCG tablet [id number] No Take 500 mcg by mouth daily. [provider] 01/18/2024 Morning Active Multiple Informants  empagliflozin  (JARDIANCE ) 10 MG TABS tablet [id number] No Take 1 tablet (10 mg total) by mouth daily before breakfast. Pietro Redell RAMAN, MD 01/18/2024 Morning Active Multiple Informants  ferrous sulfate  324 (65 Fe) MG TBEC [id number] No Take 324 mg by mouth daily with breakfast. [provider] 01/18/2024 Morning Active Multiple Informants  fluticasone  (FLONASE ) 50 MCG/ACT nasal spray [id number] No Place 1 spray into both nostrils daily as needed for allergies. Cheryle Page, MD Unknown Active Multiple Informants  furosemide  (LASIX ) 20 MG tablet [id number] No Take 1 tablet (20 mg total) by mouth daily as needed.  Patient taking differently: Take 20 mg by mouth daily as needed for fluid.   Parrett, Madelin RAMAN, NP Unknown Active Multiple Informants           Med Note MARISA, KATHY N   Tue Jan 19, 2024  4:42 PM)    guaifenesin  (HUMIBID E) 400 MG TABS tablet [id number] No Take 400 mg by mouth in the morning and at bedtime. [provider] 01/18/2024 Bedtime Active Multiple Informants  insulin  aspart (NOVOLOG ) 100 UNIT/ML injection [id number] No Inject 6-12 Units into the skin daily as needed for high blood sugar (INCREASE AS NEEDED/AS DIRECTED). [provider] 01/18/2024 Bedtime Active Multiple Informants  insulin  glargine (LANTUS ) 100 UNIT/ML injection [id number] No Inject 26 Units into the skin See admin instructions. Inject 26 units into the skin in the morning and evening [provider] 01/18/2024 Evening Active Multiple Informants  levothyroxine  (SYNTHROID ) 125 MCG tablet [id number] No Take 125 mcg by mouth daily before breakfast. [provider] 01/19/2024 Morning  Active Multiple Informants  losartan  (COZAAR ) 100 MG tablet [id number] No Take 1 tablet (100 mg total) by mouth daily. Pietro Redell RAMAN, MD 01/18/2024 Morning Active Multiple Informants  Multiple Vitamins-Minerals (MEGA MULTIVITAMIN FOR MEN PO) [id number] No Take 1 tablet by mouth daily with breakfast. [provider] 01/18/2024 Morning Active Multiple Informants  OXYGEN  [id number] No Inhale 2-4 L/min into the lungs continuous. [provider] 01/19/2024 Morning Active Multiple Informants           Med Note MARISA, KATHY N   Tue Jan 19, 2024  4:50 PM)    predniSONE  (DELTASONE ) 10 MG tablet [id number] No Take 1 tablet (10 mg total) by mouth daily with breakfast. Odean Potts, MD 01/18/2024 Morning Active Multiple Informants           Med Note MARISA, KATHY N   Tue Jan 19, 2024  7:23 PM) ONGOING  pregabalin  (LYRICA ) 100 MG capsule [id number] No Take 100 mg by mouth at bedtime. [provider] 01/18/2024 Bedtime Active Multiple Informants  revefenacin  (YUPELRI ) 175 MCG/3ML nebulizer solution [id number]  Take 3 mLs (175 mcg total) by nebulization daily. Cindy Garnette POUR, MD  Active   Semaglutide  (OZEMPIC , 1 MG/DOSE, [place]) [id number] No Inject 1 mg into the skin every Friday. [provider] 01/15/2024 Active Multiple Informants  tamsulosin  (FLOMAX ) 0.4 MG CAPS capsule [id number] No Take 1 capsule (0.4 mg total) by mouth at bedtime. Frann,  Mabel Mt, DO 01/18/2024 Bedtime Active Multiple Informants  traZODone  (DESYREL ) 50 MG tablet [id number] No Take 50 mg by mouth at bedtime. [provider] 01/18/2024 Bedtime Active Multiple Informants           Home Care and Equipment/Supplies: Were Home Health Services Ordered?: No Any new equipment or medical supplies ordered?: No  Functional Questionnaire: Do you need assistance with bathing/showering or dressing?: No Do you need assistance with meal preparation?: No Do you need assistance with eating?: No Do you have  difficulty maintaining continence: No Do you need assistance with getting out of bed/getting out of a chair/moving?: No Do you have difficulty managing or taking your medications?: No  Follow up appointments reviewed: PCP Follow-up appointment confirmed?: Yes Date of PCP follow-up appointment?: 02/02/24 Follow-up Provider: PCP- Dr. Rollene Specialist [facility] Follow-up appointment confirmed?: Yes Date of Specialist follow-up appointment?: 01/26/24 Follow-Up Specialty Provider:: [facility] 01/26/24; pulmonary provider 02/18/24 Do you need transportation to your follow-up appointment?: No Do you understand care options if your condition(s) worsen?: Yes-patient verbalized understanding  SDOH Interventions Today    Flowsheet Row Most Recent Value  SDOH Interventions   Food Insecurity Interventions Intervention Not Indicated  Housing Interventions Intervention Not Indicated  Transportation Interventions Intervention Not Indicated  [daughter continues to provide transportation]  Utilities Interventions Intervention Not Indicated   Patient declines enrollment in 30-day TOC program: last TOC program case closure was 01/11/24 (goals met without hospital re-admission- at that time, he was referred to longitudinal RN CM for ongoing outreach but was unfortunately re-admitted prior to longitudinal RN CM scheduled appointment; patient expresses frustration that he does everything the doctor tells (him) to, but this keeps happening (recurrent pleural effusions;) there is nothing a weekly call is going to do to make it not happen again- the doctors need to figure out why it keeps happening and do something so it doesn't keep happening; He confirms he has (and I re- provided) my direct contact information should questions/ concerns/ needs arise post-TOC call today; I also requested that scheduling care guide contact patient to re-schedule longitudinal RN CM outreach per patient report of being overwhelmed  by weekly calls, not wanting to re-enroll into TOC 30-day program today  See TOC assessment tabs for additional assessment/ TOC intervention information  Pls call/ message for questions,  Javen Ridings Mckinney Alaisa Moffitt, RN, BSN, CCRN Alumnus RN Care Manager  Transitions of Care  [facility] - [facility] [phone]: direct office

## 2024-02-02 ENCOUNTER — Inpatient Hospital Stay: Admitting: Internal Medicine

## 2024-02-05 ENCOUNTER — Other Ambulatory Visit: Payer: Self-pay

## 2024-02-05 NOTE — Patient Outreach (Signed)
 Complex Care Management   Visit Note  02/05/2024  Name:  [name] MRN: [id number] DOB: February 19, 1941  Situation: Referral received for Complex Care Management related to Heart Failure I obtained verbal consent from Patient.  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   DEGENERATIVE JOINT DISEASE 11/23/2009   DEPRESSION 11/23/2009   FATIGUE 11/23/2009   GAIT DISTURBANCE 12/10/2009   HEMOPTYSIS UNSPECIFIED 05/07/2010   High cholesterol    HYPERTENSION 07/30/2009   HYPOTHYROIDISM 07/30/2009   Ischemic cardiomyopathy 09/02/2017   LUMBAR RADICULOPATHY, 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   On home oxygen  therapy    2-3L; 24/7 (09/10/2016)   OSA on CPAP    Pneumonia    PTSD (post-traumatic stress disorder) 03/10/2012   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   RA (rheumatoid arthritis) (HCC) 06/11/2011   qwhere (09/10/2016)   RESPIRATORY FAILURE, CHRONIC 07/31/2009   Scleritis of both eyes 03/17/2014   Thrombocytopenia (HCC)    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   Type II diabetes mellitus (HCC)     Assessment: Patient Reported Symptoms:  Cognitive Cognitive Status: Alert and oriented to person, place, and time, Normal speech and language skills, Insightful and able to interpret abstract concepts      Neurological Neurological Review of Symptoms: No symptoms reported    HEENT HEENT Symptoms Reported: No symptoms reported      Cardiovascular Cardiovascular Symptoms Reported: No symptoms reported Other Cardiovascular Symptoms: patient denies any swelling in lower extremties today Does patient have uncontrolled  Hypertension?: No Cardiovascular Management Strategies: Medication therapy Do You Have a Working Readable Scale?: Yes Weight: 195 lb (88.5 kg) Cardiovascular Self-Management Outcome: 4 (good)  Respiratory Respiratory Symptoms Reported: Shortness of breath Other Respiratory Symptoms: denies any problems with respirations at this time. but will have some SOB with activity. O2 at 3l/[facility] has pulse oximeter 93-95%. Respiratory Management Strategies: CPAP, Oxygen  therapy, Adequate rest, Medication therapy Respiratory Self-Management Outcome: 4 (good)  Endocrine Endocrine Symptoms Reported: No symptoms reported Is patient diabetic?: Yes Is patient checking blood sugars at home?: Yes List most recent blood sugar readings, include date and time of day: BS 114 this morning fasting, has a video visit with endocrinologist at the [facility] scheduled on 02/08/24- reports A1C 9.0 since hospital stay. Endocrine Self-Management Outcome: 3 (uncertain) Endocrine Comment: people states he is working to get A1C down to 8.0  Gastrointestinal Gastrointestinal Symptoms Reported: No symptoms reported      Genitourinary Genitourinary Symptoms Reported: Frequency Additional Genitourinary Details: states, I pee ok, just have to go to the [place] a lot    Integumentary Integumentary Symptoms Reported: Bruising Additional Integumentary Details: reports bruising to left hand day to day use with hands    Musculoskeletal Musculoskelatal Symptoms Reviewed: Limited mobility, Difficulty walking, Unsteady gait Additional Musculoskeletal Details: denies pain at this, but reports on occasion will have pain both shoulders when raising arms. states will talk with provider at next visit. Musculoskeletal Management Strategies: Medication therapy, Adequate rest, Activity, Medical device Musculoskeletal Self-Management Outcome: 4 (good) Falls in the past year?: Yes Number of falls in past year: 2 or more (shampooing carpet and foot got  tied up  in the cord, in front of a store and unsure what caused the fall states sometimes left leg gets weak.) Was there an injury with Fall?: Yes (bruised knee and elbow) Fall Risk Category Calculator: 3 Patient Fall Risk Level: High Fall Risk Patient at Risk for Falls Due to: History of fall(s), Impaired mobility, Impaired balance/gait  Psychosocial Psychosocial Symptoms Reported: No symptoms reported     Quality of Family Relationships: supportive Do you feel physically threatened by others?: No      01/11/2024    2:30 PM  Depression screen PHQ 2/9  Decreased Interest 0  Down, Depressed, Hopeless 1  PHQ - 2 Score 1  Altered sleeping 0  Tired, decreased energy 0  Change in appetite 0  Feeling bad or failure about yourself  0  Trouble concentrating 0  Moving slowly or fidgety/restless 3  Suicidal thoughts 0  PHQ-9 Score 4  Difficult doing work/chores Not difficult at all    Vitals:   02/05/24 1317 02/05/24 1352  BP: (!) 141/77 (!) 141/77  Pulse: 69 69  SpO2: 95% 95%    Medications Reviewed Today     Reviewed by Shyhiem Beeney M, RN (Registered Nurse) on 02/05/24 at 1343  Med List Status: <None>   Medication Order Taking? Sig Documenting Provider Last Dose Status Informant  acetaminophen  (TYLENOL ) 500 MG tablet [id number] Yes Take 1,000 mg by mouth every 6 (six) hours as needed for mild pain. [provider]  Active Multiple Informants  albuterol  (PROVENTIL ) (2.5 MG/3ML) 0.083% nebulizer solution [id number] Yes Take 3 mLs (2.5 mg total) by nebulization every 6 (six) hours as needed for wheezing or shortness of breath. Parrett, Madelin RAMAN, NP  Active Multiple Informants  albuterol  (VENTOLIN  HFA) 108 (90 Base) MCG/ACT inhaler [id number] Yes Inhale 1-2 puffs into the lungs every 6 (six) hours as needed for wheezing or shortness of breath. Parrett, Madelin RAMAN, NP  Active Multiple Informants  arformoterol  (BROVANA ) 15 MCG/2ML NEBU [id number] Yes Take 2 mLs (15 mcg total) by nebulization  2 (two) times daily. Cindy Garnette POUR, MD  Active   aspirin  81 MG chewable tablet [id number] Yes Chew 81 mg by mouth daily. Rollene Almarie LABOR, MD  Active Multiple Informants           Med Note MARISA, NATHANEL SAILOR   Tue Jan 19, 2024  4:42 PM)    atorvastatin  (LIPITOR ) 40 MG tablet [id number] Yes Take 1 tablet (40 mg total) by mouth daily. Okey Vina GAILS, MD  Active Multiple Informants  Carboxymethylcellul-Glycerin (LUBRICATING EYE DROPS OP) [id number] Yes Place 1 drop into both eyes 4 (four) times daily as needed (dry eyes).  [provider]  Active Multiple Informants  carvedilol  (COREG ) 3.125 MG tablet [id number] Yes TAKE 1 TABLET(3.125 MG) BY MOUTH TWICE DAILY WITH A MEAL Rollene Almarie LABOR, MD  Active Multiple Informants  Cholecalciferol  (VITAMIN D3) 2000 units capsule [id number] Yes Take 2,000 Units by mouth daily.  [provider]  Active Multiple Informants  cyanocobalamin  (VITAMIN B12) 500 MCG tablet [id number] Yes Take 500 mcg by mouth daily. [provider]  Active Multiple Informants  empagliflozin  (JARDIANCE ) 10 MG TABS tablet [id number] Yes Take 1 tablet (10 mg total) by mouth daily before breakfast. Pietro Redell RAMAN, MD  Active Multiple Informants  ferrous sulfate  324 (65 Fe) MG TBEC [id number] Yes Take 324 mg by mouth daily with breakfast. [provider]  Active Multiple Informants  fluticasone  (FLONASE ) 50 MCG/ACT nasal spray [id number] Yes Place 1 spray  into both nostrils daily as needed for allergies. Cheryle Page, MD  Active Multiple Informants  furosemide  (LASIX ) 20 MG tablet [id number] Yes Take 1 tablet (20 mg total) by mouth daily as needed. Parrett, Madelin RAMAN, NP  Active Multiple Informants           Med Note MARISA, KATHY N   Tue Jan 19, 2024  4:42 PM)    guaifenesin  (HUMIBID E) 400 MG TABS tablet [id number] Yes Take 400 mg by mouth in the morning and at bedtime. [provider]  Active Multiple Informants  insulin  aspart (NOVOLOG ) 100 UNIT/ML  injection [id number] Yes Inject 6-12 Units into the skin daily as needed for high blood sugar (INCREASE AS NEEDED/AS DIRECTED). [provider]  Active Multiple Informants  insulin  glargine (LANTUS ) 100 UNIT/ML injection [id number] Yes Inject 26 Units into the skin See admin instructions. Inject 26 units into the skin in the morning and evening [provider]  Active Multiple Informants  levothyroxine  (SYNTHROID ) 125 MCG tablet [id number] Yes Take 125 mcg by mouth daily before breakfast. [provider]  Active Multiple Informants  losartan  (COZAAR ) 100 MG tablet [id number] Yes Take 1 tablet (100 mg total) by mouth daily. Pietro Redell RAMAN, MD  Active Multiple Informants  Multiple Vitamins-Minerals (MEGA MULTIVITAMIN FOR MEN PO) [id number] Yes Take 1 tablet by mouth daily with breakfast. [provider]  Active Multiple Informants  OXYGEN  [id number] Yes Inhale 2-4 L/min into the lungs continuous. [provider]  Active Multiple Informants           Med Note MARISA, NATHANEL SAILOR   Tue Jan 19, 2024  4:50 PM)    predniSONE  (DELTASONE ) 10 MG tablet [id number] Yes Take 1 tablet (10 mg total) by mouth daily with breakfast. Odean Potts, MD  Active Multiple Informants           Med Note MARISA, NATHANEL SAILOR   Tue Jan 19, 2024  7:23 PM) ONGOING  pregabalin  (LYRICA ) 100 MG capsule [id number] Yes Take 100 mg by mouth at bedtime. [provider]  Active Multiple Informants  revefenacin  (YUPELRI ) 175 MCG/3ML nebulizer solution [id number] Yes Take 3 mLs (175 mcg total) by nebulization daily. Cindy Garnette POUR, MD  Active   Semaglutide  (OZEMPIC , 1 MG/DOSE, [place]) [id number] Yes Inject 1 mg into the skin every Friday. [provider]  Active Multiple Informants  tamsulosin  (FLOMAX ) 0.4 MG CAPS capsule [id number] Yes Take 1 capsule (0.4 mg total) by mouth at bedtime. Frann Mabel Mt, DO  Active Multiple Informants  traZODone  (DESYREL ) 50 MG tablet [id number] Yes Take 50 mg  by mouth at bedtime. [provider]  Active Multiple Informants          Recommendation:   Continue Current Plan of Care  Follow Up Plan:   Telephone follow up appointment date/time:  02/18/24 at 2:00 pm  Heddy Shutter, RN, MSN, BSN, CCM [place]  [facility], Population Health Case Manager Phone: [phone]

## 2024-02-05 NOTE — Patient Instructions (Signed)
 Visit Information  Thank you for taking time to visit with me today. Please don't hesitate to contact me if I can be of assistance to you before our next scheduled appointment.  Our next appointment is by telephone on 02/18/24 at 2:00 pm Please call the care guide team at [phone] if you need to cancel or reschedule your appointment.   Following is a copy of your care plan:   Goals Addressed             This Visit's Progress    VBCI RN Care Plan       Problems:  Chronic Disease Management support and education needs related to CHF Fall risk  Goal: Over the next 90 days the Patient will attend all scheduled medical appointments: providers as evidenced by review of chart and/or patient report        continue to work with RN Care Manager and/or Social Worker to address care management and care coordination needs related to CHF as evidenced by adherence to care management team scheduled appointments     demonstrate Ongoing health management independence as evidenced by patient report and/or review of chart        verbalize understanding of plan for management of CHF as evidenced by patient report and/or review of chart  Interventions:   Heart Failure Interventions: Assessed need for readable accurate scales in home Reviewed role of diuretics in prevention of fluid overload and management of heart failure; Discussed the importance of keeping all appointments with provider Screening for signs and symptoms of depression related to chronic disease state  Assessed social determinant of health barriers  Discussed the importance of remaining active and Advised to remain as active as tolerated  Fall Risk Interventions: Discussed fall prevention strategies Discussed importance of notifying provider if any future falls Advised patient to use assistive device as recommended, change position slowly, remove tripping hazards, clear walkways RNCM provided exercise booklet via mail  Patient  Self-Care Activities:  Attend all scheduled provider appointments Call provider office for new concerns or questions  Take medications as prescribed   call office if I gain more than 2 pounds in one day or 5 pounds in one week watch for swelling in feet, ankles and legs every day Use Fall prevention strategies as discussed Review exercise booklet sent via mail and contact RNCM if questions. Do not perform any exercises you do not feel safe performing or any exercises that cause you pain.  Plan:  Telephone follow up appointment with care management team member scheduled for:  02/18/24 at 2:00 pm           Please call the Suicide and Crisis Lifeline: 988 call the [place]  National Suicide Prevention Lifeline: (231)489-7570 or TTY: (423)866-7558 TTY 603-243-2832) to talk to a trained counselor if you are experiencing a Mental Health or Behavioral Health Crisis or need someone to talk to.  Patient verbalizes understanding of instructions and care plan provided today and agrees to view in MyChart. Active MyChart status and patient understanding of how to access instructions and care plan via MyChart confirmed with patient.     Heddy Shutter, RN, MSN, BSN, CCM [place]  [facility], Population Health Case Manager Phone: [phone]

## 2024-02-11 ENCOUNTER — Encounter (HOSPITAL_COMMUNITY): Payer: Self-pay | Admitting: Family Medicine

## 2024-02-11 ENCOUNTER — Emergency Department (HOSPITAL_COMMUNITY)

## 2024-02-11 ENCOUNTER — Inpatient Hospital Stay (HOSPITAL_COMMUNITY)
Admission: EM | Admit: 2024-02-11 | Discharge: 2024-02-26 | DRG: 091 | Disposition: E | Attending: Internal Medicine | Admitting: Internal Medicine

## 2024-02-11 DIAGNOSIS — R531 Weakness: Secondary | ICD-10-CM | POA: Diagnosis present

## 2024-02-11 DIAGNOSIS — E7800 Pure hypercholesterolemia, unspecified: Secondary | ICD-10-CM | POA: Diagnosis present

## 2024-02-11 DIAGNOSIS — G4733 Obstructive sleep apnea (adult) (pediatric): Secondary | ICD-10-CM | POA: Diagnosis present

## 2024-02-11 DIAGNOSIS — Z961 Presence of intraocular lens: Secondary | ICD-10-CM | POA: Diagnosis present

## 2024-02-11 DIAGNOSIS — Z794 Long term (current) use of insulin: Secondary | ICD-10-CM

## 2024-02-11 DIAGNOSIS — I10 Essential (primary) hypertension: Secondary | ICD-10-CM | POA: Diagnosis not present

## 2024-02-11 DIAGNOSIS — R0609 Other forms of dyspnea: Secondary | ICD-10-CM | POA: Diagnosis not present

## 2024-02-11 DIAGNOSIS — J9611 Chronic respiratory failure with hypoxia: Secondary | ICD-10-CM | POA: Diagnosis present

## 2024-02-11 DIAGNOSIS — I480 Paroxysmal atrial fibrillation: Secondary | ICD-10-CM | POA: Diagnosis present

## 2024-02-11 DIAGNOSIS — J8410 Pulmonary fibrosis, unspecified: Secondary | ICD-10-CM | POA: Diagnosis present

## 2024-02-11 DIAGNOSIS — F419 Anxiety disorder, unspecified: Secondary | ICD-10-CM | POA: Diagnosis present

## 2024-02-11 DIAGNOSIS — Z7984 Long term (current) use of oral hypoglycemic drugs: Secondary | ICD-10-CM

## 2024-02-11 DIAGNOSIS — F4310 Post-traumatic stress disorder, unspecified: Secondary | ICD-10-CM | POA: Diagnosis present

## 2024-02-11 DIAGNOSIS — J9621 Acute and chronic respiratory failure with hypoxia: Secondary | ICD-10-CM | POA: Diagnosis not present

## 2024-02-11 DIAGNOSIS — R5381 Other malaise: Secondary | ICD-10-CM | POA: Diagnosis present

## 2024-02-11 DIAGNOSIS — I6523 Occlusion and stenosis of bilateral carotid arteries: Secondary | ICD-10-CM | POA: Diagnosis not present

## 2024-02-11 DIAGNOSIS — R262 Difficulty in walking, not elsewhere classified: Secondary | ICD-10-CM | POA: Diagnosis present

## 2024-02-11 DIAGNOSIS — N1831 Chronic kidney disease, stage 3a: Secondary | ICD-10-CM | POA: Diagnosis present

## 2024-02-11 DIAGNOSIS — J449 Chronic obstructive pulmonary disease, unspecified: Secondary | ICD-10-CM | POA: Diagnosis present

## 2024-02-11 DIAGNOSIS — R9082 White matter disease, unspecified: Secondary | ICD-10-CM | POA: Diagnosis not present

## 2024-02-11 DIAGNOSIS — E1151 Type 2 diabetes mellitus with diabetic peripheral angiopathy without gangrene: Secondary | ICD-10-CM | POA: Diagnosis present

## 2024-02-11 DIAGNOSIS — E1122 Type 2 diabetes mellitus with diabetic chronic kidney disease: Secondary | ICD-10-CM | POA: Diagnosis present

## 2024-02-11 DIAGNOSIS — I255 Ischemic cardiomyopathy: Secondary | ICD-10-CM | POA: Diagnosis present

## 2024-02-11 DIAGNOSIS — R7989 Other specified abnormal findings of blood chemistry: Principal | ICD-10-CM

## 2024-02-11 DIAGNOSIS — Z95820 Peripheral vascular angioplasty status with implants and grafts: Secondary | ICD-10-CM

## 2024-02-11 DIAGNOSIS — F32A Depression, unspecified: Secondary | ICD-10-CM | POA: Diagnosis present

## 2024-02-11 DIAGNOSIS — Z8701 Personal history of pneumonia (recurrent): Secondary | ICD-10-CM

## 2024-02-11 DIAGNOSIS — Z955 Presence of coronary angioplasty implant and graft: Secondary | ICD-10-CM

## 2024-02-11 DIAGNOSIS — R52 Pain, unspecified: Secondary | ICD-10-CM | POA: Diagnosis not present

## 2024-02-11 DIAGNOSIS — R9089 Other abnormal findings on diagnostic imaging of central nervous system: Secondary | ICD-10-CM | POA: Diagnosis not present

## 2024-02-11 DIAGNOSIS — Z7952 Long term (current) use of systemic steroids: Secondary | ICD-10-CM

## 2024-02-11 DIAGNOSIS — I5042 Chronic combined systolic (congestive) and diastolic (congestive) heart failure: Secondary | ICD-10-CM | POA: Diagnosis present

## 2024-02-11 DIAGNOSIS — R110 Nausea: Secondary | ICD-10-CM | POA: Diagnosis present

## 2024-02-11 DIAGNOSIS — I4811 Longstanding persistent atrial fibrillation: Secondary | ICD-10-CM | POA: Diagnosis present

## 2024-02-11 DIAGNOSIS — Z471 Aftercare following joint replacement surgery: Secondary | ICD-10-CM | POA: Diagnosis not present

## 2024-02-11 DIAGNOSIS — F458 Other somatoform disorders: Secondary | ICD-10-CM | POA: Diagnosis present

## 2024-02-11 DIAGNOSIS — R251 Tremor, unspecified: Secondary | ICD-10-CM | POA: Diagnosis present

## 2024-02-11 DIAGNOSIS — I671 Cerebral aneurysm, nonruptured: Principal | ICD-10-CM | POA: Diagnosis present

## 2024-02-11 DIAGNOSIS — I669 Occlusion and stenosis of unspecified cerebral artery: Secondary | ICD-10-CM | POA: Diagnosis not present

## 2024-02-11 DIAGNOSIS — E039 Hypothyroidism, unspecified: Secondary | ICD-10-CM | POA: Diagnosis present

## 2024-02-11 DIAGNOSIS — I999 Unspecified disorder of circulatory system: Secondary | ICD-10-CM | POA: Diagnosis not present

## 2024-02-11 DIAGNOSIS — D631 Anemia in chronic kidney disease: Secondary | ICD-10-CM | POA: Diagnosis present

## 2024-02-11 DIAGNOSIS — I25709 Atherosclerosis of coronary artery bypass graft(s), unspecified, with unspecified angina pectoris: Secondary | ICD-10-CM | POA: Diagnosis present

## 2024-02-11 DIAGNOSIS — Z950 Presence of cardiac pacemaker: Secondary | ICD-10-CM

## 2024-02-11 DIAGNOSIS — D693 Immune thrombocytopenic purpura: Secondary | ICD-10-CM | POA: Diagnosis present

## 2024-02-11 DIAGNOSIS — Z515 Encounter for palliative care: Secondary | ICD-10-CM

## 2024-02-11 DIAGNOSIS — I130 Hypertensive heart and chronic kidney disease with heart failure and stage 1 through stage 4 chronic kidney disease, or unspecified chronic kidney disease: Secondary | ICD-10-CM | POA: Diagnosis present

## 2024-02-11 DIAGNOSIS — H9192 Unspecified hearing loss, left ear: Secondary | ICD-10-CM | POA: Diagnosis present

## 2024-02-11 DIAGNOSIS — J929 Pleural plaque without asbestos: Secondary | ICD-10-CM | POA: Diagnosis not present

## 2024-02-11 DIAGNOSIS — Z7982 Long term (current) use of aspirin: Secondary | ICD-10-CM

## 2024-02-11 DIAGNOSIS — Z9889 Other specified postprocedural states: Secondary | ICD-10-CM

## 2024-02-11 DIAGNOSIS — Z87891 Personal history of nicotine dependence: Secondary | ICD-10-CM

## 2024-02-11 DIAGNOSIS — Z79890 Hormone replacement therapy (postmenopausal): Secondary | ICD-10-CM

## 2024-02-11 DIAGNOSIS — Z66 Do not resuscitate: Secondary | ICD-10-CM | POA: Diagnosis present

## 2024-02-11 DIAGNOSIS — M069 Rheumatoid arthritis, unspecified: Secondary | ICD-10-CM | POA: Diagnosis present

## 2024-02-11 DIAGNOSIS — F40240 Claustrophobia: Secondary | ICD-10-CM | POA: Diagnosis present

## 2024-02-11 DIAGNOSIS — I459 Conduction disorder, unspecified: Secondary | ICD-10-CM | POA: Diagnosis present

## 2024-02-11 DIAGNOSIS — I6622 Occlusion and stenosis of left posterior cerebral artery: Secondary | ICD-10-CM | POA: Diagnosis present

## 2024-02-11 DIAGNOSIS — R778 Other specified abnormalities of plasma proteins: Secondary | ICD-10-CM | POA: Diagnosis not present

## 2024-02-11 DIAGNOSIS — E860 Dehydration: Secondary | ICD-10-CM | POA: Diagnosis present

## 2024-02-11 DIAGNOSIS — I3481 Nonrheumatic mitral (valve) annulus calcification: Secondary | ICD-10-CM | POA: Diagnosis not present

## 2024-02-11 DIAGNOSIS — I6623 Occlusion and stenosis of bilateral posterior cerebral arteries: Secondary | ICD-10-CM | POA: Diagnosis not present

## 2024-02-11 DIAGNOSIS — E118 Type 2 diabetes mellitus with unspecified complications: Secondary | ICD-10-CM | POA: Diagnosis not present

## 2024-02-11 DIAGNOSIS — Z7985 Long-term (current) use of injectable non-insulin antidiabetic drugs: Secondary | ICD-10-CM

## 2024-02-11 DIAGNOSIS — Z9842 Cataract extraction status, left eye: Secondary | ICD-10-CM

## 2024-02-11 DIAGNOSIS — I2510 Atherosclerotic heart disease of native coronary artery without angina pectoris: Secondary | ICD-10-CM | POA: Diagnosis present

## 2024-02-11 DIAGNOSIS — N400 Enlarged prostate without lower urinary tract symptoms: Secondary | ICD-10-CM | POA: Diagnosis present

## 2024-02-11 DIAGNOSIS — Z5320 Procedure and treatment not carried out because of patient's decision for unspecified reasons: Secondary | ICD-10-CM | POA: Diagnosis not present

## 2024-02-11 DIAGNOSIS — Z9841 Cataract extraction status, right eye: Secondary | ICD-10-CM

## 2024-02-11 DIAGNOSIS — Z79899 Other long term (current) drug therapy: Secondary | ICD-10-CM

## 2024-02-11 DIAGNOSIS — J432 Centrilobular emphysema: Secondary | ICD-10-CM | POA: Diagnosis not present

## 2024-02-11 DIAGNOSIS — Z9981 Dependence on supplemental oxygen: Secondary | ICD-10-CM

## 2024-02-11 LAB — CBC
HCT: 41.1 % (ref 39.0–52.0)
Hemoglobin: 12.3 g/dL — ABNORMAL LOW (ref 13.0–17.0)
MCH: 26.6 pg (ref 26.0–34.0)
MCHC: 29.9 g/dL — ABNORMAL LOW (ref 30.0–36.0)
MCV: 89 fL (ref 80.0–100.0)
Platelets: 124 K/uL — ABNORMAL LOW (ref 150–400)
RBC: 4.62 MIL/uL (ref 4.22–5.81)
RDW: 15.1 % (ref 11.5–15.5)
WBC: 6.9 K/uL (ref 4.0–10.5)
nRBC: 0 % (ref 0.0–0.2)

## 2024-02-11 LAB — COMPREHENSIVE METABOLIC PANEL WITH GFR
ALT: 17 U/L (ref 0–44)
AST: 22 U/L (ref 15–41)
Albumin: 3.7 g/dL (ref 3.5–5.0)
Alkaline Phosphatase: 63 U/L (ref 38–126)
Anion gap: 11 (ref 5–15)
BUN: 25 mg/dL — ABNORMAL HIGH (ref 8–23)
CO2: 26 mmol/L (ref 22–32)
Calcium: 9.2 mg/dL (ref 8.9–10.3)
Chloride: 101 mmol/L (ref 98–111)
Creatinine, Ser: 1.41 mg/dL — ABNORMAL HIGH (ref 0.61–1.24)
GFR, Estimated: 50 mL/min — ABNORMAL LOW (ref >60)
Glucose, Bld: 160 mg/dL — ABNORMAL HIGH (ref 70–99)
Potassium: 4.2 mmol/L (ref 3.5–5.1)
Sodium: 138 mmol/L (ref 135–145)
Total Bilirubin: 0.9 mg/dL (ref 0.0–1.2)
Total Protein: 7.4 g/dL (ref 6.5–8.1)

## 2024-02-11 LAB — MAGNESIUM: Magnesium: 1.8 mg/dL (ref 1.7–2.4)

## 2024-02-11 LAB — URINALYSIS, ROUTINE W REFLEX MICROSCOPIC
Bacteria, UA: NONE SEEN
Bilirubin Urine: NEGATIVE
Glucose, UA: >=500 mg/dL — AB
Hgb urine dipstick: NEGATIVE
Ketones, ur: NEGATIVE mg/dL
Leukocytes,Ua: NEGATIVE
Nitrite: NEGATIVE
Protein, ur: 30 mg/dL — AB
Specific Gravity, Urine: 1.01 (ref 1.005–1.030)
pH: 6 (ref 5.0–8.0)

## 2024-02-11 LAB — BRAIN NATRIURETIC PEPTIDE: B Natriuretic Peptide: 313.7 pg/mL — ABNORMAL HIGH (ref 0.0–100.0)

## 2024-02-11 LAB — CBG MONITORING, ED
Glucose-Capillary: 170 mg/dL — ABNORMAL HIGH (ref 70–99)
Glucose-Capillary: 145 mg/dL — ABNORMAL HIGH (ref 70–99)

## 2024-02-11 LAB — TSH: TSH: 35.783 u[IU]/mL — ABNORMAL HIGH (ref 0.350–4.500)

## 2024-02-11 LAB — TROPONIN I (HIGH SENSITIVITY)
Troponin I (High Sensitivity): 30 ng/L — ABNORMAL HIGH (ref <18)
Troponin I (High Sensitivity): 30 ng/L — ABNORMAL HIGH (ref <18)

## 2024-02-11 LAB — VITAMIN B12: Vitamin B-12: 664 pg/mL (ref 180–914)

## 2024-02-11 MED ORDER — IOHEXOL 350 MG/ML SOLN
75.0000 mL | Freq: Once | INTRAVENOUS | Status: AC | PRN
Start: 2024-02-11 — End: 2024-02-11
  Administered 2024-02-11: 75 mL via INTRAVENOUS

## 2024-02-11 MED ORDER — ACETAMINOPHEN 325 MG PO TABS
650.0000 mg | ORAL_TABLET | Freq: Four times a day (QID) | ORAL | Status: DC | PRN
  Administered 2024-02-12: 650 mg via ORAL
  Filled 2024-02-11: qty 2

## 2024-02-11 MED ORDER — INSULIN ASPART 100 UNIT/ML IJ SOLN
0.0000 [IU] | Freq: Three times a day (TID) | INTRAMUSCULAR | Status: DC
  Administered 2024-02-11: 2 [IU] via SUBCUTANEOUS
  Administered 2024-02-12 – 2024-02-13 (×4): 3 [IU] via SUBCUTANEOUS
  Administered 2024-02-13: 2 [IU] via SUBCUTANEOUS
  Administered 2024-02-13 – 2024-02-14 (×2): 3 [IU] via SUBCUTANEOUS
  Administered 2024-02-14: 5 [IU] via SUBCUTANEOUS
  Administered 2024-02-14: 3 [IU] via SUBCUTANEOUS
  Administered 2024-02-15 (×2): 5 [IU] via SUBCUTANEOUS
  Filled 2024-02-11: qty 0.15

## 2024-02-11 MED ORDER — LACTATED RINGERS IV BOLUS
250.0000 mL | Freq: Once | INTRAVENOUS | Status: AC
  Administered 2024-02-11: 250 mL via INTRAVENOUS

## 2024-02-11 MED ORDER — ACETAMINOPHEN 650 MG RE SUPP: 650.0000 mg | Freq: Four times a day (QID) | RECTAL | Status: DC | PRN

## 2024-02-11 NOTE — Assessment & Plan Note (Addendum)
 Prior to 02-13-2024. Elevated, has not had medication today Continue losartan  100mg  daily, coreg  3.125mg  BID and takes lasix  20mg  daily PRN  02-13-2024 BP is stable now. Continue home meds.  02-14-2024 BP stable. Continue cozaar  and lasix .  02-15-2024 stable

## 2024-02-11 NOTE — Assessment & Plan Note (Addendum)
 Stable, continue home nebs/meds -yupelri , brovana  albuterol  PRN

## 2024-02-11 NOTE — ED Provider Notes (Signed)
 [facility] Provider Note   CSN: [id number] Arrival date & time: 02/11/24  9170     Patient presents with: Leg Pain and Weakness   [name] is a 83 y.o. male.   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   Gets a strange feeling like electric shock feeling in brain, left arm. Not short of breath. Not hurting anywhere but when heart beats feels pain in ear drums. Cold sweats.  Lost all ability to stand and walk.    Last night lost ability to walk.  Legs so shaky around 8 or 9 oclock, very weak, and this morning could not walk at all  Gained 4lb 3-4 days ago, took the diuretic and urinated a lot, still urinating a lot now.  Also had diarrhea a few days ago, now has resolved, wonders if he might be dehydrated.   Then started having electric shock feeling to the back of the jaw, yesterday earlier in the day, continued through the day. Lay down and spent most of the day in the bed  No neck or back pain Just behind jaw, eyes, ears No abdominal pain No chest pain, every now and then a little something Breathing is better Cold sweats, no known fever Slight headaches in the morning.     Prior to Admission medications   Medication Sig Start Date End Date Taking? Authorizing Provider  albuterol  (VENTOLIN  HFA) 108 (90 Base) MCG/ACT inhaler Inhale 1-2 puffs into the lungs every 6 (six) hours as needed for wheezing or shortness of breath. Patient taking differently: Inhale 2 puffs into the lungs every 6 (six) hours as needed for wheezing or shortness of breath. 01/07/24  Yes Parrett, Tammy S, NP   arformoterol  (BROVANA ) 15 MCG/2ML NEBU Take 2 mLs (15 mcg total) by nebulization 2 (two) times daily. 01/21/24  Yes Cindy Garnette POUR, MD  OXYGEN  Inhale 2-4 L/min into the lungs continuous.   Yes [provider]  revefenacin  (YUPELRI ) 175 MCG/3ML nebulizer solution Take 3 mLs (175 mcg total) by nebulization daily. 01/22/24  Yes Cindy Garnette POUR, MD  acetaminophen  (TYLENOL ) 500 MG tablet Take 1,000 mg by mouth every 6 (six) hours as needed for mild pain.    [provider]  albuterol  (PROVENTIL ) (2.5 MG/3ML) 0.083% nebulizer solution Take 3 mLs (2.5 mg total) by nebulization every 6 (six) hours as needed for wheezing or shortness of breath. 12/29/23   Parrett, Madelin RAMAN, NP  aspirin  81 MG chewable tablet Chew 81 mg by mouth daily.    Rollene Almarie LABOR, MD  atorvastatin  (LIPITOR ) 40 MG tablet Take 1 tablet (40 mg total) by mouth daily. 10/21/16   Okey Vina LULLA, MD  Carboxymethylcellul-Glycerin (LUBRICATING EYE DROPS OP) Place 1 drop into both eyes 4 (four) times daily as needed (dry eyes).     [provider]  carvedilol  (COREG ) 3.125 MG tablet TAKE 1 TABLET(3.125 MG) BY MOUTH TWICE DAILY WITH A MEAL 07/20/23   Rollene Almarie LABOR, MD  Cholecalciferol  (VITAMIN D3) 2000 units capsule Take 2,000 Units by mouth daily.  01/11/16   [provider]  cyanocobalamin  (VITAMIN B12) 500 MCG tablet Take 500  mcg by mouth daily. 07/30/22   [provider]  empagliflozin  (JARDIANCE ) 10 MG TABS tablet Take 1 tablet (10 mg total) by mouth daily before breakfast. 12/23/23   Pietro Redell RAMAN, MD  ferrous sulfate  324 (65 Fe) MG TBEC Take 324 mg by mouth daily with breakfast. 10/02/21   [provider]  fluticasone  (FLONASE ) 50 MCG/ACT nasal spray Place 1 spray into both nostrils daily as needed for allergies. 12/09/23   Cheryle Page, MD  furosemide  (LASIX ) 20 MG tablet Take 1 tablet (20 mg total) by mouth daily as needed. 12/29/23   Parrett, Madelin RAMAN, NP  guaifenesin  (HUMIBID E)  400 MG TABS tablet Take 400 mg by mouth in the morning and at bedtime. 05/23/23   [provider]  insulin  aspart (NOVOLOG ) 100 UNIT/ML injection Inject 6-12 Units into the skin daily as needed for high blood sugar (INCREASE AS NEEDED/AS DIRECTED).    [provider]  insulin  glargine (LANTUS ) 100 UNIT/ML injection Inject 26 Units into the skin See admin instructions. Inject 26 units into the skin in the morning and evening    [provider]  levothyroxine  (SYNTHROID ) 125 MCG tablet Take 125 mcg by mouth daily before breakfast. 12/07/22   [provider]  losartan  (COZAAR ) 100 MG tablet Take 1 tablet (100 mg total) by mouth daily. 12/23/23 03/22/24  Pietro Redell RAMAN, MD  Multiple Vitamins-Minerals (MEGA MULTIVITAMIN FOR MEN PO) Take 1 tablet by mouth daily with breakfast.    [provider]  predniSONE  (DELTASONE ) 10 MG tablet Take 1 tablet (10 mg total) by mouth daily with breakfast. 12/01/23   Odean Potts, MD  pregabalin  (LYRICA ) 100 MG capsule Take 100 mg by mouth at bedtime.    [provider]  Semaglutide  (OZEMPIC , 1 MG/DOSE, [place]) Inject 1 mg into the skin every Friday.    [provider]  tamsulosin  (FLOMAX ) 0.4 MG CAPS capsule Take 1 capsule (0.4 mg total) by mouth at bedtime. 09/14/17   Frann Mabel Mt, DO  traZODone  (DESYREL ) 50 MG tablet Take 50 mg by mouth at bedtime. 11/06/22   [provider]    Allergies: Patient has no known allergies.    Review of Systems  Updated Vital Signs BP (!) 153/76 (BP Location: Right Arm)   Pulse 69   Temp (!) 97.5 F (36.4 C) (Oral)   Resp 20   SpO2 100%   Physical Exam Vitals and nursing note reviewed.  Constitutional:      General: He is not in acute distress.    Appearance: Normal appearance. He is well-developed. He is not ill-appearing or diaphoretic.  HENT:     Head: Normocephalic and atraumatic.  Eyes:     General: No visual field deficit.    Extraocular  Movements: Extraocular movements intact.     Conjunctiva/sclera: Conjunctivae normal.     Pupils: Pupils are equal, round, and reactive to light.  Cardiovascular:     Rate and Rhythm: Normal rate and regular rhythm.     Pulses: Normal pulses.     Heart sounds: Normal heart sounds. No murmur heard.    No friction rub. No gallop.  Pulmonary:     Effort: Pulmonary effort is normal. No respiratory distress.     Breath sounds: Normal breath sounds. No wheezing or rales.  Abdominal:     General: There is no distension.     Palpations: Abdomen is soft.     Tenderness: There is no abdominal tenderness. There is no guarding.  Musculoskeletal:        General: No swelling or tenderness.     Cervical back: Normal range of motion.     Comments: Doppler signal DP right, no doppler signal left DP, +signal PT on left  Erythema left lower extremity (chronic per pt)  Skin:    General: Skin is warm and dry.     Findings: No erythema or rash.  Neurological:     General: No focal deficit present.     Mental Status: He is alert and oriented to person, place, and time.     GCS: GCS eye subscore is 4. GCS verbal subscore is 5. GCS motor subscore is 6.     Cranial Nerves: No cranial nerve deficit, dysarthria or facial asymmetry.     Sensory: No sensory deficit.     Motor: No weakness or tremor.     Coordination: Coordination abnormal. Finger-Nose-Finger Test normal.     Gait: Gait normal.     Deep Tendon Reflexes: Abnormal reflex: bilateral tremor (new per pt).     (all labs ordered are listed, but only abnormal results are displayed) Labs Reviewed  COMPREHENSIVE METABOLIC PANEL WITH GFR - Abnormal; Notable for the following components:      Result Value   Glucose, Bld 160 (*)    BUN 25 (*)    Creatinine, Ser 1.41 (*)    GFR, Estimated 50 (*)    All other components within normal limits  CBC - Abnormal; Notable for the following components:   Hemoglobin 12.3 (*)    MCHC 29.9 (*)    Platelets  124 (*)    All other components within normal limits  URINALYSIS, ROUTINE W REFLEX MICROSCOPIC - Abnormal; Notable for the following components:   Glucose, UA >=500 (*)    Protein, ur 30 (*)    All other components within normal limits  BRAIN NATRIURETIC PEPTIDE - Abnormal; Notable for the following components:   B Natriuretic Peptide 313.7 (*)    All other components within normal limits  CBG MONITORING, ED - Abnormal; Notable for the following components:   Glucose-Capillary 170 (*)    All other components within normal limits  TROPONIN I (HIGH SENSITIVITY) - Abnormal; Notable for the following components:   Troponin I (High Sensitivity) 30 (*)    All other components within normal limits  VITAMIN B12  TROPONIN I (HIGH SENSITIVITY)    EKG: EKG Interpretation Date/Time:  Thursday February 11 2024 08:41:13 EDT Ventricular Rate:  60 PR Interval:  240 QRS Duration:  191 QT Interval:  521 QTC Calculation: 521 R Axis:   -58  Text Interpretation: Sinus rhythm Prolonged PR interval VENTRICULAR PACED RHYTHM No significant change since last tracing Confirmed by Dreama Longs ([id number]) on 02/11/2024 9:53:53 AM  Radiology: [facility] Chest Portable 1 View Result Date: 02/11/2024 CLINICAL DATA:  Generalized weakness EXAM: PORTABLE CHEST 1 VIEW COMPARISON:  01/22/2024 FINDINGS: Left pacer remains in place, unchanged. Heart mediastinal contours within normal limits. Calcified pleural plaques noted on the right. Areas of scarring throughout the right lung and in the left lung base. No definite acute process or change from prior study. IMPRESSION: Stable chronic changes.  No definite acute process. Electronically Signed   By: Franky Crease M.D.   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  EKG completed and personally eval and interpreted by me showed a paced rhythm.  Differential diagnosis includes arrhythmia, ACS, dehydration, anemia, electrolyte abnormalities, medication effects, posterior circulation CVA, acute spinal pathology, ICH.   Labs completed and personally reviewed and interpreted by me show hemoglobin 12.3, no leukocytosis, stable creatinine, no sign of urinary tract infection.  BNP is mildly elevated compared to prior value, troponin is mildly elevated.  Chest x-ray shows stable chronic changes without acute abnormalities, no acute edema, pneumothorax, or pneumonia.  While he describes a feeling of weakness, his lower extremity strength appears to be normal on exam and have low suspicion for acute spinal pathology as etiology of his difficulty ambulating.    He has noted to have chronic left lower extremity ischemia for which he is seen vascular surgery.  His previously documented Doppler signals were in both DP and PT, however today I am only able to locate a PT Doppler signal on the left.  He denies any acute pain in this foot, has normal movement, unchanged sensation from his previous, and have low suspicion for acute vascular pathology as an etiology of his difficulty ambulating, however given change in his vascular exam, have consulted Dr. Lajoyce of vascular surgery who will evaluate.  Discussed concern with patient that his difficulty ambulating and feeling off off balance as well  as appearance of limb ataxia may be concerning for posterior circulation CVA.  CTA head and neck are ordered with radiology read pending at this time.  He reports that he will not tolerate an MRI due to claustrophobia.  Discussed with Dr. Matthews who reports that neurology will evaluate him.    He reports diarrhea few days ago, increased urination after taking his diuretic, consider possible dehydration as etiology of his symptoms, and he was given a small bolus of fluid.  Will plan admission to [facility] for further evaluation in the setting of acute onset difficulty walking.     Final diagnoses:  Generalized weakness  Elevated troponin  Impaired ambulation    ED Discharge Orders     None          Dreama Longs, MD 02/11/24 1601

## 2024-02-11 NOTE — Progress Notes (Signed)
 Patient arrived to room 3W02 from [facility] ED. VS obtained, and Admission database began.

## 2024-02-11 NOTE — Assessment & Plan Note (Addendum)
 Prior to 02-13-2024. 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 Per vascular note he has not been symptomatic. Not weaker on left side.   New finding of absent DP doppler per EDP  EDP discussed with vascular on call and they plan on seeing him.   02-13-2024 vascular surgery consult noted. He has no worrisome findings on history or physical exam to suggest vascular catastrophe. Doubt this is the source of his weakness.   02-14-2024 stable.  02-15-2024 stable

## 2024-02-11 NOTE — Consult Note (Signed)
 [facility] [place]  ASSESSMENT / PLAN: 83 y.o. male with known history of peripheral arterial disease.  He has no worrisome findings on history or physical exam to suggest vascular catastrophe.  Doubt this is the source of his weakness.  Recommend continued medical therapy for peripheral arterial disease.  Will ensure he has outpatient follow-up with Dr. Gretta.  Please call for any questions  CHIEF COMPLAINT: Generalized weakness  HISTORY OF PRESENT ILLNESS: [name] is a 83 y.o. male who presents to the [facility] for evaluation of malaise, weakness, difficulty walking.  Patient has a strong personal history of peripheral arterial disease, and has been followed by my partner, Dr. Gretta.  Surgical history is significant for left common femoral to posterior tibial artery bypass with vein in his undergone endovascular intervention most recently in 2023.  He has no complaints related to his left lower extremity.  The ER physician examining him did notice absent dorsalis pedis Doppler flow, and asked me to evaluate him as this seem to be a change.  On my exam, the patient reports no issues with the left leg.  He can walk as much as he likes is prior to this episode of weakness.  He does not describe claudication.  He reports no rest pain.  He has no ulcers about his feet.   Past Medical History:  Diagnosis Date   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   ablated   Anxiety    Aortic aneurysm (HCC) 09/02/2017   BENIGN PROSTATIC HYPERTROPHY 11/23/2009   Cardiomyopathy (HCC) 08/28/2016   Chronic systolic CHF (congestive heart failure) (HCC) 09/02/2017   COPD (chronic obstructive pulmonary disease) (HCC)    CORONARY ARTERY DISEASE 11/23/2009   DECREASED HEARING, 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   DEGENERATIVE JOINT DISEASE 11/23/2009   DEPRESSION 11/23/2009   FATIGUE 11/23/2009   GAIT DISTURBANCE 12/10/2009   HEMOPTYSIS UNSPECIFIED 05/07/2010   High cholesterol    HYPERTENSION  07/30/2009   HYPOTHYROIDISM 07/30/2009   Ischemic cardiomyopathy 09/02/2017   LUMBAR RADICULOPATHY, 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   On home oxygen  therapy    2-3L; 24/7 (09/10/2016)   OSA on CPAP    Pneumonia    PTSD (post-traumatic stress disorder) 03/10/2012   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   RA (rheumatoid arthritis) (HCC) 06/11/2011   qwhere (09/10/2016)   RESPIRATORY FAILURE, CHRONIC 07/31/2009   Scleritis of both eyes 03/17/2014   Thrombocytopenia (HCC)    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   Type II diabetes mellitus (HCC)     Past Surgical History:  Procedure Laterality Date   ABDOMINAL AORTIC ANEURYSM REPAIR  07/2002   [facility] 12/10/2010   ABDOMINAL AORTOGRAM W/LOWER EXTREMITY N/A 08/16/2020   Procedure: ABDOMINAL AORTOGRAM W/LOWER EXTREMITY;  Surgeon: Gretta Lonni PARAS, MD;  Location: [facility];  Service: Cardiovascular;  Laterality: N/A;   ABDOMINAL AORTOGRAM W/LOWER EXTREMITY N/A 10/24/2021   Procedure: ABDOMINAL AORTOGRAM W/LOWER EXTREMITY;  Surgeon: Gretta Lonni PARAS, MD;  Location: [facility];  Service: Cardiovascular;  Laterality: N/A;   ABDOMINAL AORTOGRAM W/LOWER EXTREMITY N/A 03/13/2022   Procedure: ABDOMINAL AORTOGRAM W/LOWER EXTREMITY;  Surgeon: Gretta Lonni PARAS, MD;  Location: [facility];  Service: Cardiovascular;  Laterality: N/A;   ABDOMINAL EXPLORATION SURGERY  02/2004   w/LOA/notes 12/10/2010; small bowel obstruction repair with adhesiolysis    BACK SURGERY     CARDIAC CATHETERIZATION     2 heart caths in the past.  One in 2000s showed one ulcerated plaque  Rx medically; Second at [facility]  09/05/2016   CATARACT EXTRACTION W/ INTRAOCULAR LENS  IMPLANT, BILATERAL Bilateral 2000s   COLECTOMY     hx of remote ileum resection due to bleeding   COLONOSCOPY WITH PROPOFOL  N/A 01/22/2018   Procedure: COLONOSCOPY WITH PROPOFOL ;  Surgeon: Avram Lupita BRAVO, MD;  Location: [facility];  Service: Endoscopy;  Laterality: N/A;   CORONARY ANGIOPLASTY WITH STENT PLACEMENT   09/10/2016   CORONARY STENT INTERVENTION N/A 09/10/2016   Procedure: Coronary Stent Intervention;  Surgeon: Lonni JONETTA Cash, MD;  Location: [facility];  Service: Cardiovascular;  Laterality: N/A;  Distal RCA 4.0x16 Synergy   ESOPHAGOGASTRODUODENOSCOPY (EGD) WITH PROPOFOL  N/A 01/22/2018   Procedure: ESOPHAGOGASTRODUODENOSCOPY (EGD) WITH PROPOFOL ;  Surgeon: Avram Lupita BRAVO, MD;  Location: [facility];  Service: Endoscopy;  Laterality: N/A;   EYE SURGERY     FALSE ANEURYSM REPAIR Left 08/22/2020   Procedure: EXCLUSION OF LEFT POPLITEAL ARTERY ANEURYSM;  Surgeon: Gretta Lonni PARAS, MD;  [facility];  Service: Vascular;  Laterality: Left;   FEMORAL EMBOLOECTOMY Left 07/2000   with left leg ischemia; Dr. Gerlean, vascular   FEMORAL-POPLITEAL BYPASS GRAFT Bilateral 08/22/2020   Procedure: LEFT FEMORAL-POSTERIOR TIBIAL ARTERY BYPASS GRAFT USING RIGHT GREATER NONREVERSED SAPHENOUS VEIN GRAFT;  Surgeon: Gretta Lonni PARAS, MD;  Location: [facility] OR;  Service: Vascular;  Laterality: Bilateral;   GANGLION CYST EXCISION Right    wrist; Dr. Leonor   HOT HEMOSTASIS N/A 01/22/2018   Procedure: HOT HEMOSTASIS (ARGON PLASMA COAGULATION/BICAP);  Surgeon: Avram Lupita BRAVO, MD;  Location: [facility];  Service: Endoscopy;  Laterality: N/A;   LOOP RECORDER INSERTION N/A 04/25/2019   Procedure: LOOP RECORDER INSERTION;  Surgeon: Fernande Elspeth BROCKS, MD;  Location: [facility] INVASIVE CV LAB;  Service: Cardiovascular;  Laterality: N/A;   LOOP RECORDER REMOVAL N/A 07/01/2019   Procedure: LOOP RECORDER REMOVAL;  Surgeon: Fernande Elspeth BROCKS, MD;  Location: [facility] INVASIVE CV LAB;  Service: Cardiovascular;  Laterality: N/A;   LUMBAR LAMINECTOMY  1972   Dr. Beverley   PACEMAKER IMPLANT N/A 07/01/2019   Procedure: PACEMAKER IMPLANT;  Surgeon: Fernande Elspeth BROCKS, MD;  Location: [facility] INVASIVE CV LAB;  Service: Cardiovascular;  Laterality: N/A;   PERIPHERAL VASCULAR BALLOON ANGIOPLASTY Left 10/24/2021   Procedure: PERIPHERAL VASCULAR  BALLOON ANGIOPLASTY;  Surgeon: Gretta Lonni PARAS, MD;  Location: [facility];  Service: Cardiovascular;  Laterality: Left;   RIGHT/LEFT HEART CATH AND CORONARY ANGIOGRAPHY N/A 09/10/2016   Procedure: Right/Left Heart Cath and Coronary Angiography;  Surgeon: Lonni JONETTA Cash, MD;  Location: [facility] INVASIVE CV LAB;  Service: Cardiovascular;  Laterality: N/A;   TONSILLECTOMY      Family History  Adopted: Yes  Problem Relation Age of Onset   Other Mother        gun shot    Social History   Socioeconomic History   Marital status: Widowed    Spouse name: Not on file   Number of children: Not on file   Years of education: Not on file   Highest education level: Not on file  Occupational History   Occupation: disabled veteran, [employer]    Employer: RETIRED  Tobacco Use   Smoking status: Former    Current packs/day: 0.00    Average packs/day: 2.5 packs/day for 40.0 years (100.0 ttl pk-yrs)    Types: Cigarettes, Pipe, Cigars    Start date: 07/28/1958    Quit date: 07/28/1998    Years since quitting: 25.5    Passive exposure: Never   Smokeless tobacco: Never  Vaping  Use   Vaping status: Never Used  Substance and Sexual Activity   Alcohol  use: No    Alcohol /week: 0.0 standard drinks of alcohol    Drug use: No   Sexual activity: Not Currently  Other Topics Concern   Not on file  Social History Narrative   Widowed   Sees [place] every 6 month for meds.   Denies asbestos exposure   Social Drivers of Corporate investment banker Strain: Low Risk  (01/11/2024)   Overall Financial Resource Strain (CARDIA)    Difficulty of Paying Living Expenses: Not hard at all  Food Insecurity: No Food Insecurity (02/05/2024)   Hunger Vital Sign    Worried About Running Out of Food in the Last Year: Never true    Ran Out of Food in the Last Year: Never true  Transportation Needs: No Transportation Needs (02/05/2024)   PRAPARE - Administrator, Civil Service (Medical): No     Lack of Transportation (Non-Medical): No  Physical Activity: Insufficiently Active (01/11/2024)   Exercise Vital Sign    Days of Exercise per Week: 4 days    Minutes of Exercise per Session: 30 min  Stress: No Stress Concern Present (01/11/2024)   [employer] of Occupational Health - Occupational Stress Questionnaire    Feeling of Stress: Not at all  Social Connections: Socially Isolated (01/19/2024)   Social Connection and Isolation Panel    Frequency of Communication with Friends and Family: More than three times a week    Frequency of Social Gatherings with Friends and Family: Twice a week    Attends Religious Services: Never    Database administrator or Organizations: No    Attends Banker Meetings: Never    Marital Status: Widowed  Intimate Partner Violence: Not At Risk (02/05/2024)   Humiliation, Afraid, Rape, and Kick questionnaire    Fear of Current or Ex-Partner: No    Emotionally Abused: No    Physically Abused: No    Sexually Abused: No    No Known Allergies  Current Facility-Administered Medications  Medication Dose Route Frequency Provider Last Rate Last Admin   acetaminophen  (TYLENOL ) tablet 650 mg  650 mg Oral Q6H PRN Waddell Rake, MD       Or   acetaminophen  (TYLENOL ) suppository 650 mg  650 mg Rectal Q6H PRN Waddell Rake, MD       insulin  aspart (novoLOG ) injection 0-15 Units  0-15 Units Subcutaneous TID WC Waddell Rake, MD       Current Outpatient Medications  Medication Sig Dispense Refill   albuterol  (VENTOLIN  HFA) 108 (90 Base) MCG/ACT inhaler Inhale 1-2 puffs into the lungs every 6 (six) hours as needed for wheezing or shortness of breath. (Patient taking differently: Inhale 2 puffs into the lungs every 6 (six) hours as needed for wheezing or shortness of breath.) 1 each 5   arformoterol  (BROVANA ) 15 MCG/2ML NEBU Take 2 mLs (15 mcg total) by nebulization 2 (two) times daily. 120 mL 0   OXYGEN  Inhale 2-4 L/min into the lungs continuous.      revefenacin  (YUPELRI ) 175 MCG/3ML nebulizer solution Take 3 mLs (175 mcg total) by nebulization daily. 90 mL 0   acetaminophen  (TYLENOL ) 500 MG tablet Take 1,000 mg by mouth every 6 (six) hours as needed for mild pain.     albuterol  (PROVENTIL ) (2.5 MG/3ML) 0.083% nebulizer solution Take 3 mLs (2.5 mg total) by nebulization every 6 (six) hours as needed for wheezing or shortness of breath. 120  mL 12   aspirin  81 MG chewable tablet Chew 81 mg by mouth daily.     atorvastatin  (LIPITOR ) 40 MG tablet Take 1 tablet (40 mg total) by mouth daily. 90 tablet 3   Carboxymethylcellul-Glycerin (LUBRICATING EYE DROPS OP) Place 1 drop into both eyes 4 (four) times daily as needed (dry eyes).      carvedilol  (COREG ) 3.125 MG tablet TAKE 1 TABLET(3.125 MG) BY MOUTH TWICE DAILY WITH A MEAL 60 tablet 3   Cholecalciferol  (VITAMIN D3) 2000 units capsule Take 2,000 Units by mouth daily.      cyanocobalamin  (VITAMIN B12) 500 MCG tablet Take 500 mcg by mouth daily.     empagliflozin  (JARDIANCE ) 10 MG TABS tablet Take 1 tablet (10 mg total) by mouth daily before breakfast. 30 tablet 3   ferrous sulfate  324 (65 Fe) MG TBEC Take 324 mg by mouth daily with breakfast.     fluticasone  (FLONASE ) 50 MCG/ACT nasal spray Place 1 spray into both nostrils daily as needed for allergies.     furosemide  (LASIX ) 20 MG tablet Take 1 tablet (20 mg total) by mouth daily as needed. 30 tablet 2   guaifenesin  (HUMIBID E) 400 MG TABS tablet Take 400 mg by mouth in the morning and at bedtime.     insulin  aspart (NOVOLOG ) 100 UNIT/ML injection Inject 6-12 Units into the skin daily as needed for high blood sugar (INCREASE AS NEEDED/AS DIRECTED).     insulin  glargine (LANTUS ) 100 UNIT/ML injection Inject 26 Units into the skin See admin instructions. Inject 26 units into the skin in the morning and evening     levothyroxine  (SYNTHROID ) 125 MCG tablet Take 125 mcg by mouth daily before breakfast.     losartan  (COZAAR ) 100 MG tablet Take 1 tablet  (100 mg total) by mouth daily. 90 tablet 0   Multiple Vitamins-Minerals (MEGA MULTIVITAMIN FOR MEN PO) Take 1 tablet by mouth daily with breakfast.     predniSONE  (DELTASONE ) 10 MG tablet Take 1 tablet (10 mg total) by mouth daily with breakfast. 90 tablet 3   pregabalin  (LYRICA ) 100 MG capsule Take 100 mg by mouth at bedtime.     Semaglutide  (OZEMPIC , 1 MG/DOSE, [place]) Inject 1 mg into the skin every Friday.     tamsulosin  (FLOMAX ) 0.4 MG CAPS capsule Take 1 capsule (0.4 mg total) by mouth at bedtime. 30 capsule 0   traZODone  (DESYREL ) 50 MG tablet Take 50 mg by mouth at bedtime.      PHYSICAL EXAM Vitals:   02/11/24 0839 02/11/24 1130 02/11/24 1215 02/11/24 1415  BP: (!) 168/78 (!) 172/88 (!) 200/88 (!) 153/76  Pulse: 61 66 66 69  Resp: 18 20 18 20   Temp: 98 F (36.7 C)  (!) 97.5 F (36.4 C)   TempSrc: Oral  Oral   SpO2: 97% 100% 100% 100%   Elderly man in no acute distress Regular rate and rhythm Unlabored breathing Good Doppler flow in the left foot with peroneal and posterior tibial artery signal.  No ischemic ulcers.  Normal motor and sensory function in the foot.  PERTINENT LABORATORY AND RADIOLOGIC DATA  Most recent CBC    Latest Ref Rng & Units 02/11/2024    8:47 AM 01/22/2024    5:12 AM 01/21/2024    4:57 AM  CBC  WBC 4.0 - 10.5 K/uL 6.9  7.8  8.5   Hemoglobin 13.0 - 17.0 g/dL 87.6  87.9  87.9   Hematocrit 39.0 - 52.0 % 41.1  40.3  40.1  Platelets 150 - 400 K/uL 124  150  163      Most recent CMP    Latest Ref Rng & Units 02/11/2024    8:47 AM 01/23/2024    7:45 AM 01/22/2024    5:12 AM  CMP  Glucose 70 - 99 mg/dL 839  691  861   BUN 8 - 23 mg/dL 25  34  33   Creatinine 0.61 - 1.24 mg/dL 8.58  8.65  8.51   Sodium 135 - 145 mmol/L 138  136  137   Potassium 3.5 - 5.1 mmol/L 4.2  4.9  4.2   Chloride 98 - 111 mmol/L 101  97  99   CO2 22 - 32 mmol/L 26  29  30    Calcium  8.9 - 10.3 mg/dL 9.2  8.5  8.4   Total Protein 6.5 - 8.1 g/dL 7.4  6.3  6.0   Total Bilirubin  0.0 - 1.2 mg/dL 0.9  0.6  0.8   Alkaline Phos 38 - 126 U/L 63  56  52   AST 15 - 41 U/L 22  23  22    ALT 0 - 44 U/L 17  16  17      Renal function Estimated Creatinine Clearance: 47 mL/min (A) (by C-G formula based on SCr of 1.41 mg/dL (H)).  Hemoglobin A1C (no units)  Date Value  02/17/2018 8.2   Hgb A1c MFr Bld (%)  Date Value  12/04/2023 8.6 (H)    LDL Chol Calc (NIH)  Date Value Ref Range Status  01/02/2023 60 0 - 99 mg/dL Final   Direct LDL  Date Value Ref Range Status  10/02/2016 106.0 mg/dL Final    Comment:    Optimal:  <100 mg/dLNear or Above Optimal:  100-129 mg/dLBorderline High:  130-159 mg/dLHigh:  160-189 mg/dLVery High:  >190 mg/dL    Debby SAILOR. Magda, MD [facility] Phone Number: [phone] 02/11/2024 5:35 PM   Total time spent on preparing this encounter including chart review, data review, collecting history, examining the patient, and coordinating care: 60 minutes  Portions of this report may have been transcribed using voice recognition software.  Every effort has been made to ensure accuracy; however, inadvertent computerized transcription errors may still be present.

## 2024-02-11 NOTE — Assessment & Plan Note (Addendum)
 Baseline creatinine 1.3-1.4, stable

## 2024-02-11 NOTE — Assessment & Plan Note (Addendum)
 Stable on 3-4L oxygen  PRN

## 2024-02-11 NOTE — Assessment & Plan Note (Addendum)
 Prior to 02-13-2024. 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 See their consult note.  02-15-2024 NIR not planning on any interventions.

## 2024-02-11 NOTE — Assessment & Plan Note (Addendum)
 No longer on AC, on ASA daily  Continue ASA and coreg 

## 2024-02-11 NOTE — Assessment & Plan Note (Addendum)
 Continue cpap nightly

## 2024-02-11 NOTE — Assessment & Plan Note (Addendum)
 Prior to 02-13-2024. TSH wnl in May of 2025 Repeat TSH ABNORMAL.  ? If not taking his medication???  Continue current synthroid  at 125mcg daily   02-13-2024 not sure what to make of TSH 35.78 with a normal FT4 of 0.65. he will need repeat TSH in office in 4 weeks. And maybe outpatient endocrinology consult if TSH remains abnormal. Leave on synthroid  125 mcg daily.  02-14-2024 repeat FT4 and TSH. Pt is NOT hyperthyroid. He has normal FT4 level. Not sure what to make of his elevated TSH.  If his repeat TSH is elevated, he can go see outpatient endocrinology. There is no endocrine consultation in the hospital. This is not a problem that needs to be address in the hospital.  02-15-2024 repeat TSH 23.8. continue with his same dose of synthroid . His repeat FT4 was 0.67.  no utility in repeating his TSH any sooner than in 6 weeks.

## 2024-02-11 NOTE — Assessment & Plan Note (Addendum)
 Baseline hgb around 12, stable

## 2024-02-11 NOTE — H&P (Signed)
 History and Physical    Patient: [name] FMW:[id number] DOB: Sep 09, 1940 DOA: 02/11/2024 DOS: the patient was seen and examined on 02/11/2024 PCP: Rollene Almarie LABOR, MD  Patient coming from: Home - lives alone. Uses walker.    Chief Complaint: generalized weakness  HPI: [name] is a 83 y.o. 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 He had diarrhea about 3 days ago and had one day with about 4 episodes of watery diarrhea. He felt a little bit more weak after this and he increased his water intake. He then thought he over drank and took some extra diuretics. He got up yesterday and he felt a little weak and had more trouble maneuvering his walker which is new for him. He seemed to get weaker as the day progressed. Today he couldn't really walk well and had some strange feelings behind his right ear. He had pain shooting down his left arm that felt like little shocks. He also had a headache. With the weakness he came to ED.   Baseline he is independent with all ADLs. Still cooks his own meals, showers, and drives. His daughter comes once a week and will help or run errands with him if he needs anything. He does all of his own finances.   Admitted 01/19/24 for acute on chronic respiratory failure in setting of severe COPD and recurrent moderate left pleural effusion of unclear etiology. Chest tube placed and removed 6/26. Cytology negative for malignant cells.    Denies any fever/chills, vision changes/headaches, chest pain or palpitations, shortness of breath or cough, abdominal pain, N/V/D, dysuria or leg swelling.     He does not smoke or drink alcohol .   ER Course:  vitals: afebrile, bp: 168/78, HR: 61, RR: 18, oxygen : 97% 2L [place] Pertinent labs: hgb: 12.3, platelets 124, BUN: 25, creatinine: 1.41,  BNP: 313, troponin 30  CXR: stable chronic changes. No definite acute process.  CTA head/neck:  In ED: neurology consulted. Given 250cc bolus. PPM interrogated.   Review of Systems: As mentioned in the history of present illness. All other systems reviewed and are negative. Past Medical History:  Diagnosis Date   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   ablated   Anxiety    Aortic aneurysm (HCC) 09/02/2017   BENIGN PROSTATIC HYPERTROPHY 11/23/2009   Cardiomyopathy (HCC) 08/28/2016   Chronic systolic CHF (congestive heart failure) (HCC) 09/02/2017   COPD (chronic obstructive pulmonary disease) (HCC)    CORONARY ARTERY DISEASE 11/23/2009   DECREASED HEARING, 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   DEGENERATIVE JOINT DISEASE 11/23/2009   DEPRESSION 11/23/2009   FATIGUE 11/23/2009   GAIT DISTURBANCE 12/10/2009   HEMOPTYSIS UNSPECIFIED 05/07/2010   High cholesterol    HYPERTENSION 07/30/2009   HYPOTHYROIDISM 07/30/2009   Ischemic cardiomyopathy 09/02/2017   LUMBAR RADICULOPATHY, 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   On home oxygen  therapy    2-3L; 24/7 (09/10/2016)   OSA on CPAP    Pneumonia    PTSD (post-traumatic stress disorder) 03/10/2012   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   RA (rheumatoid arthritis) (HCC) 06/11/2011   qwhere (09/10/2016)   RESPIRATORY FAILURE, CHRONIC 07/31/2009   Scleritis of both eyes 03/17/2014   Thrombocytopenia (HCC)    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  Type II diabetes mellitus [facility])    Past Surgical History:  Procedure Laterality Date   ABDOMINAL AORTIC ANEURYSM REPAIR  07/2002   thelbert 12/10/2010   ABDOMINAL AORTOGRAM W/LOWER EXTREMITY N/A 08/16/2020   Procedure: ABDOMINAL AORTOGRAM W/LOWER EXTREMITY;  Surgeon: Gretta Lonni PARAS, MD;  Location: [facility];  Service: Cardiovascular;  Laterality: N/A;   ABDOMINAL AORTOGRAM  W/LOWER EXTREMITY N/A 10/24/2021   Procedure: ABDOMINAL AORTOGRAM W/LOWER EXTREMITY;  Surgeon: Gretta Lonni PARAS, MD;  Location: [facility];  Service: Cardiovascular;  Laterality: N/A;   ABDOMINAL AORTOGRAM W/LOWER EXTREMITY N/A 03/13/2022   Procedure: ABDOMINAL AORTOGRAM W/LOWER EXTREMITY;  Surgeon: Gretta Lonni PARAS, MD;  Location: [facility];  Service: Cardiovascular;  Laterality: N/A;   ABDOMINAL EXPLORATION SURGERY  02/2004   w/LOA/notes 12/10/2010; small bowel obstruction repair with adhesiolysis    BACK SURGERY     CARDIAC CATHETERIZATION     2 heart caths in the past.  One in 2000s showed one ulcerated plaque  Rx medically; Second at [facility] /notes 09/05/2016   CATARACT EXTRACTION W/ INTRAOCULAR LENS  IMPLANT, BILATERAL Bilateral 2000s   COLECTOMY     hx of remote ileum resection due to bleeding   COLONOSCOPY WITH PROPOFOL  N/A 01/22/2018   Procedure: COLONOSCOPY WITH PROPOFOL ;  Surgeon: Avram Lupita BRAVO, MD;  Location: [facility];  Service: Endoscopy;  Laterality: N/A;   CORONARY ANGIOPLASTY WITH STENT PLACEMENT  09/10/2016   CORONARY STENT INTERVENTION N/A 09/10/2016   Procedure: Coronary Stent Intervention;  Surgeon: Lonni JONETTA Cash, MD;  Location: [facility];  Service: Cardiovascular;  Laterality: N/A;  Distal RCA 4.0x16 Synergy   ESOPHAGOGASTRODUODENOSCOPY (EGD) WITH PROPOFOL  N/A 01/22/2018   Procedure: ESOPHAGOGASTRODUODENOSCOPY (EGD) WITH PROPOFOL ;  Surgeon: Avram Lupita BRAVO, MD;  Location: [facility];  Service: Endoscopy;  Laterality: N/A;   EYE SURGERY     FALSE ANEURYSM REPAIR Left 08/22/2020   Procedure: EXCLUSION OF LEFT POPLITEAL ARTERY ANEURYSM;  Surgeon: Gretta Lonni PARAS, MD;  Location: [facility];  Service: Vascular;  Laterality: Left;   FEMORAL EMBOLOECTOMY Left 07/2000   with left leg ischemia; Dr. Gerlean, vascular   FEMORAL-POPLITEAL BYPASS GRAFT Bilateral 08/22/2020   Procedure: LEFT FEMORAL-POSTERIOR TIBIAL ARTERY BYPASS GRAFT USING RIGHT  GREATER NONREVERSED SAPHENOUS VEIN GRAFT;  Surgeon: Gretta Lonni PARAS, MD;  Location: [facility] OR;  Service: Vascular;  Laterality: Bilateral;   GANGLION CYST EXCISION Right    wrist; Dr. Leonor   HOT HEMOSTASIS N/A 01/22/2018   Procedure: HOT HEMOSTASIS (ARGON PLASMA COAGULATION/BICAP);  Surgeon: Avram Lupita BRAVO, MD;  Location: [facility];  Service: Endoscopy;  Laterality: N/A;   LOOP RECORDER INSERTION N/A 04/25/2019   Procedure: LOOP RECORDER INSERTION;  Surgeon: Fernande Elspeth BROCKS, MD;  Location[facility] INVASIVE CV LAB;  Service: Cardiovascular;  Laterality: N/A;   LOOP RECORDER REMOVAL N/A 07/01/2019   Procedure: LOOP RECORDER REMOVAL;  Surgeon: Fernande Elspeth BROCKS, MD;  Location: [facility] INVASIVE CV LAB;  Service: Cardiovascular;  Laterality: N/A;   LUMBAR LAMINECTOMY  1972   Dr. Beverley   PACEMAKER IMPLANT N/A 07/01/2019   Procedure: PACEMAKER IMPLANT;  Surgeon: Fernande Elspeth BROCKS, MD;  Location: [facility] INVASIVE CV LAB;  Service: Cardiovascular;  Laterality: N/A;   PERIPHERAL VASCULAR BALLOON ANGIOPLASTY Left 10/24/2021   Procedure: PERIPHERAL VASCULAR BALLOON ANGIOPLASTY;  Surgeon: Gretta Lonni PARAS, MD;  Location: [facility];  Service: Cardiovascular;  Laterality: Left;   RIGHT/LEFT HEART CATH AND CORONARY ANGIOGRAPHY N/A 09/10/2016   Procedure: Right/Left Heart Cath and Coronary  Angiography;  Surgeon: Lonni JONETTA Cash, MD;  Location: [facility] INVASIVE CV LAB;  Service: Cardiovascular;  Laterality: N/A;   TONSILLECTOMY     Social History:  reports that he quit smoking about 25 years ago. His smoking use included cigarettes, pipe, and cigars. He started smoking about 65 years ago. He has a 100 pack-year smoking history. He has never been exposed to tobacco smoke. He has never used smokeless tobacco. He reports that he does not drink alcohol  and does not use drugs.  No Known Allergies  Family History  Adopted: Yes  Problem Relation Age of Onset   Other Mother        gun shot    Prior to Admission  medications   Medication Sig Start Date End Date Taking? Authorizing Provider  albuterol  (VENTOLIN  HFA) 108 (90 Base) MCG/ACT inhaler Inhale 1-2 puffs into the lungs every 6 (six) hours as needed for wheezing or shortness of breath. Patient taking differently: Inhale 2 puffs into the lungs every 6 (six) hours as needed for wheezing or shortness of breath. 01/07/24  Yes Parrett, Tammy S, NP  arformoterol  (BROVANA ) 15 MCG/2ML NEBU Take 2 mLs (15 mcg total) by nebulization 2 (two) times daily. 01/21/24  Yes Cindy Garnette POUR, MD  OXYGEN  Inhale 2-4 L/min into the lungs continuous.   Yes [provider]  revefenacin  (YUPELRI ) 175 MCG/3ML nebulizer solution Take 3 mLs (175 mcg total) by nebulization daily. 01/22/24  Yes Cindy Garnette POUR, MD  acetaminophen  (TYLENOL ) 500 MG tablet Take 1,000 mg by mouth every 6 (six) hours as needed for mild pain.    [provider]  albuterol  (PROVENTIL ) (2.5 MG/3ML) 0.083% nebulizer solution Take 3 mLs (2.5 mg total) by nebulization every 6 (six) hours as needed for wheezing or shortness of breath. 12/29/23   Parrett, Madelin RAMAN, NP  aspirin  81 MG chewable tablet Chew 81 mg by mouth daily.    Rollene Almarie LABOR, MD  atorvastatin  (LIPITOR ) 40 MG tablet Take 1 tablet (40 mg total) by mouth daily. 10/21/16   Okey Vina GAILS, MD  Carboxymethylcellul-Glycerin (LUBRICATING EYE DROPS OP) Place 1 drop into both eyes 4 (four) times daily as needed (dry eyes).     [provider]  carvedilol  (COREG ) 3.125 MG tablet TAKE 1 TABLET(3.125 MG) BY MOUTH TWICE DAILY WITH A MEAL 07/20/23   Rollene Almarie LABOR, MD  Cholecalciferol  (VITAMIN D3) 2000 units capsule Take 2,000 Units by mouth daily.  01/11/16   [provider]  cyanocobalamin  (VITAMIN B12) 500 MCG tablet Take 500 mcg by mouth daily. 07/30/22   [provider]  empagliflozin  (JARDIANCE ) 10 MG TABS tablet Take 1 tablet (10 mg total) by mouth daily before breakfast. 12/23/23   Pietro Redell RAMAN, MD   ferrous sulfate  324 (65 Fe) MG TBEC Take 324 mg by mouth daily with breakfast. 10/02/21   [provider]  fluticasone  (FLONASE ) 50 MCG/ACT nasal spray Place 1 spray into both nostrils daily as needed for allergies. 12/09/23   Cheryle Page, MD  furosemide  (LASIX ) 20 MG tablet Take 1 tablet (20 mg total) by mouth daily as needed. 12/29/23   Parrett, Madelin RAMAN, NP  guaifenesin  (HUMIBID E) 400 MG TABS tablet Take 400 mg by mouth in the morning and at bedtime. 05/23/23   [provider]  insulin  aspart (NOVOLOG ) 100 UNIT/ML injection Inject 6-12 Units into the skin daily as needed for high blood sugar (INCREASE AS NEEDED/AS DIRECTED).    [provider]  insulin  glargine (LANTUS )  100 UNIT/ML injection Inject 26 Units into the skin See admin instructions. Inject 26 units into the skin in the morning and evening    [provider]  levothyroxine  (SYNTHROID ) 125 MCG tablet Take 125 mcg by mouth daily before breakfast. 12/07/22   [provider]  losartan  (COZAAR ) 100 MG tablet Take 1 tablet (100 mg total) by mouth daily. 12/23/23 03/22/24  Pietro Redell RAMAN, MD  Multiple Vitamins-Minerals (MEGA MULTIVITAMIN FOR MEN PO) Take 1 tablet by mouth daily with breakfast.    [provider]  predniSONE  (DELTASONE ) 10 MG tablet Take 1 tablet (10 mg total) by mouth daily with breakfast. 12/01/23   Odean Potts, MD  pregabalin  (LYRICA ) 100 MG capsule Take 100 mg by mouth at bedtime.    [provider]  Semaglutide  (OZEMPIC , 1 MG/DOSE, [place]) Inject 1 mg into the skin every Friday.    [provider]  tamsulosin  (FLOMAX ) 0.4 MG CAPS capsule Take 1 capsule (0.4 mg total) by mouth at bedtime. 09/14/17   Frann Mabel Mt, DO  traZODone  (DESYREL ) 50 MG tablet Take 50 mg by mouth at bedtime. 11/06/22   [provider]    Physical Exam: Vitals:   02/11/24 1800 02/11/24 2000 02/11/24 2015 02/11/24 2219  BP: (!) 168/118 (!) 160/92  (!) 186/91   Pulse: 81 64  70  Resp: (!) 23 (!) 27  (!) 22  Temp:    97.9 F (36.6 C)  TempSrc:    Oral  SpO2: 99% 97% 99% 99%   General:  Appears calm and comfortable and is in NAD Eyes:  PERRL, EOMI, normal lids, iris ENT:  grossly normal hearing, lips & tongue, mmm; appropriate dentition Neck:  no LAD, masses or thyromegaly; no carotid bruits Cardiovascular:  RRR, no m/r/g. 1+ edema in LLL  Respiratory:   poor air movement through bilateral lung bases. No wheezing/rales.  Abdomen:  soft, NT, ND, NABS Back:   normal alignment, no CVAT Skin:  no rash or induration seen on limited exam Musculoskeletal:  grossly normal tone BUE/BLE, good ROM, no bony abnormality Lower extremity:  LLE with 1+ edema with cool to touch with erythema/purple hue to LL color.   Limited foot exam with no ulcerations.  2+ distal pulses of RLE. Non palpable on left.  Psychiatric:  grossly normal mood and affect, speech fluent and appropriate, AOx3 Neurologic:  CN 2-12 grossly intact, moves all extremities in coordinated fashion, sensation intact. Intentional tremor.    Radiological Exams on Admission: Independently reviewed - see discussion in A/P where applicable  CT ANGIO HEAD NECK W WO CM Addendum Date: 02/11/2024 ADDENDUM REPORT: 02/11/2024 17:04 ADDENDUM: These results were called by telephone at the time of interpretation on 02/11/2024 at 5:03 pm to provider Dr. Gala, who verbally acknowledged these results. Electronically Signed   By: Evalene Coho M.D.   On: 02/11/2024 17:04   Result Date: 02/11/2024 CLINICAL DATA:  Neuro deficit, acute, stroke suspected EXAM: CT ANGIOGRAPHY HEAD AND NECK WITH AND WITHOUT CONTRAST TECHNIQUE: Multidetector CT imaging of the head and neck was performed using the standard protocol during bolus administration of intravenous contrast. Multiplanar CT image reconstructions and MIPs were obtained to evaluate the vascular anatomy. Carotid stenosis measurements (when applicable) are obtained  utilizing NASCET criteria, using the distal internal carotid diameter as the denominator. RADIATION DOSE REDUCTION: This exam was performed according to the departmental dose-optimization program which includes automated exposure control, adjustment of the mA and/or kV according to patient size and/or use of  iterative reconstruction technique. CONTRAST:  75mL OMNIPAQUE  IOHEXOL  350 MG/ML SOLN COMPARISON:  MRI of the head dated January 21, 2018. FINDINGS: CT HEAD FINDINGS Brain: Age-related cerebral volume loss and mild periventricular and deep cerebral white matter disease. No evidence of hemorrhage, mass, cortical infarct or hydrocephalus. Vascular: Mild calcific atheromatous disease. Skull: Intact. Sinuses/Orbits: No acute process. Other: None. Review of the MIP images confirms the above findings CTA NECK FINDINGS Aortic arch: Mild calcific atheromatous disease. Right carotid system: There is mild calcific and noncalcific plaque present within the distal aspect of the common carotid artery, which results in less than 20% luminal stenosis. There is mild atheromatous disease also present proximally within the internal carotid artery, with less than 10% luminal stenosis. The internal carotid artery is mildly tortuous, but otherwise normal in caliber. Left carotid system: There is calcific and noncalcific plaque within the left carotid bulb and origin of the internal carotid artery. There is mild stenosis, less than 30%. The remainder of the cervical segment of the internal carotid artery is mildly tortuous, but normal in caliber. Vertebral arteries: The right vertebral artery is dominant. There is mild calcific plaque within the V3 segment. There is no significant stenosis. The left vertebral artery is relatively hypoplastic. There is moderate stenosis at its origin, approximately 50%. It is otherwise normal in caliber, although the V4 segment is diminutive. Skeleton: Mild dextroscoliosis of the cervical spine. Other  neck: Negative. Upper chest: Emphysema. There is streaky left apical parenchymal scarring. There are calcified right apical pleural plaques and there is left apical pleural thickening. Review of the MIP images confirms the above findings CTA HEAD FINDINGS Anterior circulation: There is a 4 x 3 x 3 mm aneurysm arising superior laterally from the A2 segment of the left cerebral artery, which is seen on image 255 of series 14. The anterior cerebral arteries are otherwise normal in caliber. There is mild to moderate calcific atheromatous disease within the cranial and cavernous segments of the internal carotid arteries with no significant luminal stenosis. The middle cerebral arteries and their proximal branches are normal in caliber with no evidence of aneurysm, large vessel occlusion or flow-limiting stenosis. Posterior circulation: There is fetal origin of the posterior cerebral arteries bilaterally. There is mild-to-moderate diffuse stenosis of the left posterior cerebral artery. There is mild diffuse irregular stenosis of the right posterior cerebral artery. The basilar artery demonstrates mild stenosis and terminates in the superior cerebellar arteries bilaterally. The left superior cerebellar artery demonstrates diffuse irregular moderate stenosis. The left V4 segment is hypoplastic. Venous sinuses: Patent. Anatomic variants: Fetal origins of both posterior cerebral arteries. Review of the MIP images confirms the above findings IMPRESSION: 1. 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 Recommend consultation of a neuro interventionalist. 2. Fetal origins of both posterior cerebral arteries. 3. Calcific plaque within the carotid bulbs and origins of the internal carotid arteries bilaterally, with less than 30% luminal stenosis. 4. Mild to moderate stenosis of the left posterior cerebral artery and the left superior cerebellar artery. Electronically Signed: By:  Evalene Coho M.D. On: 02/11/2024 16:55   DG Chest Portable 1 View Result Date: 02/11/2024 CLINICAL DATA:  Generalized weakness EXAM: PORTABLE CHEST 1 VIEW COMPARISON:  01/22/2024 FINDINGS: Left pacer remains in place, unchanged. Heart mediastinal contours within normal limits. Calcified pleural plaques noted on the right. Areas of scarring throughout the right lung and in the left lung base. No definite acute process  or change from prior study. IMPRESSION: Stable chronic changes.  No definite acute process. Electronically Signed   By: Franky Crease M.D.   On: 02/11/2024 14:05    EKG: Independently reviewed.  NSR with rate 60; nonspecific ST changes with no evidence of acute ischemia. Ventricular paced rhythm prolonged PR    Labs on Admission: I have personally reviewed the available labs and imaging studies at the time of the admission.  Pertinent labs:   hgb: 12.3,  platelets 124,  BUN: 25,  creatinine: 1.41,  BNP: 313,  troponin 30   Assessment and Plan: Principal Problem:   Weakness generalized Active Problems:   Cerebral aneurysm   S/P femoral-tibial bypass   Hypothyroidism   Essential hypertension   Diabetes mellitus with complication, with long-term current use of insulin  (HCC)   Coronary artery disease involving coronary bypass graft of native heart with angina pectoris (HCC)   PAF (paroxysmal atrial fibrillation) (HCC)   Pacemaker   Idiopathic thrombocytopenic purpura (ITP) (HCC)   COPD (chronic obstructive pulmonary disease) (HCC)   Chronic respiratory failure with hypoxia (HCC)   CKD stage 3a, GFR 45-59 ml/min (HCC)   Anemia due to chronic kidney disease   OSA on CPAP    Assessment and Plan: * Weakness generalized 83 year old presenting to ED with vague complaints of weakness, difficulty ambulating and feeling off balance and some electrical shocks going down his arm who lives alone and is independent with all ADLs -obs to tele at [facility]  -concern for possible  CVA with weakness/difficulty ambulating and some ataxia noted by EDP  -neurology consulted by EDP, Dr. Matthews.  -He is declining MRI at this time. 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 Neuro IR consulted.  -TSH, B12, magnesium   -TSH >30, was normal in May 2025. Likely not taking his medication. Free T4 pending  -PT/OT   Cerebral aneurysm CTA head/neck findings of cerebral aneurysm that is 4x3x3 in A2 segment  Neuro IR consulted   S/P femoral-tibial bypass This bypass is known to be occluded. Per vascular note he has not been symptomatic. Not weaker on left side.   New finding of absent DP doppler per EDP  EDP discussed with vascular on call and they plan on seeing him.   Hypothyroidism TSH wnl in May of 2025 Repeat TSH ABNORMAL.  ? If not taking his medication???  Continue current synthroid  at 125mcg daily   Essential hypertension Elevated, has not had medication today Continue losartan  100mg  daily, coreg  3.125mg  BID and takes lasix  20mg  daily PRN   Diabetes mellitus with complication, with long-term current use of insulin  (HCC) A1C in May of 2025 of 8.6  Continue long acting insulin  SSI QAC/HS  Continue jardiance    Coronary artery disease involving coronary bypass graft of native heart with angina pectoris (HCC) Continue medical management   PAF (paroxysmal atrial fibrillation) (HCC) No longer on AC, on ASA daily  Continue ASA and coreg    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  Interrogation order placed   Idiopathic thrombocytopenic purpura (ITP) (HCC) Platelets stable SCDs VTE ppx Continue daily prednisone    COPD (chronic obstructive pulmonary disease) (HCC) Stable, continue home nebs/meds -yupelri , brovana  albuterol  PRN   Chronic respiratory failure with hypoxia (HCC) Stable on 3-4L oxygen  PRN   CKD stage 3a,  GFR 45-59 ml/min (HCC) Baseline creatinine 1.3-1.4, stable   Anemia  due to chronic kidney disease Baseline hgb around 12, stable   OSA on CPAP Continue cpap nightly      Advance Care Planning:   Code Status: Limited: Do not attempt resuscitation (DNR) -DNR-LIMITED -Do Not Intubate/DNI    Consults: neurology and vascular and PT/OT and neuro IR   DVT Prophylaxis: SCDs  Family Communication: none   Severity of Illness: The appropriate patient status for this patient is OBSERVATION. Observation status is judged to be reasonable and necessary in order to provide the required intensity of service to ensure the patient's safety. The patient's presenting symptoms, physical exam findings, and initial radiographic and laboratory data in the context of their medical condition is felt to place them at decreased risk for further clinical deterioration. Furthermore, it is anticipated that the patient will be medically stable for discharge from the hospital within 2 midnights of admission.   Author: Isaiah Geralds, MD 02/11/2024 10:52 PM  For on call review [email or web address].

## 2024-02-11 NOTE — Assessment & Plan Note (Addendum)
 Continue medical management

## 2024-02-11 NOTE — Assessment & Plan Note (Addendum)
 Prior to 02-13-2024. 83 year old presenting to ED with vague complaints of weakness, difficulty ambulating and feeling off balance and some electrical shocks going down his arm who lives alone and is independent with all ADLs -obs to tele at [facility]  -concern for possible CVA with weakness/difficulty ambulating and some ataxia noted by EDP  -neurology consulted by EDP, Dr. Matthews.  -He is declining MRI at this time. 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 Neuro IR consulted.  -TSH, B12, magnesium   -TSH >30, was normal in May 2025. Likely not taking his medication. Free T4 pending  -PT/OT  02-13-2024 pt refused MRI. Home OT/PT recommended. Pt and family refusing home PT/OT. Stable for DC to home.  Discussed with pt's dtr [name] via phone. Discussed that pt is declining. She affirms that pt would not want home PT/OT referral. I offered to consult outpatient hospice. She does not want hospice referral right now. She will talk to pt and call hospice on her own. She states she is familiar with hospice as pt's wife was in hospice care 4 years ago before her death. Discussed discharge today. Dtr is agreeable to taking him home. Dtr is aware that pt will need portable O2 for home use. Pt refuses to go to SNF.   02-14-2024 pt changed his mind and now wants SNF placement. Also changed his mind about MRI brian. Will order brain MRI for tomorrow since he has PPM. Appears PPM is MRI compatible. Will also order IV ativan  for sedation for MRI brain. TOC aware of SNF placement request.  02-15-2024 MRI brain today. Pt wants to go to SNF. IV ativan  ordered for claustrophobia with MRI. Given his admission was 4 days ago, I doubt anything on his MRI brain is going to change his management. Pt wants to go to SNF. He refused discharge to home. Add Primidone  50 mg tid for his tremors.

## 2024-02-11 NOTE — ED Notes (Signed)
 Report received from [facility]; pt enroute to 3W (bed on floor not clean). Baseline on 3L oxygen . Left leg noted to feel cooler to touch and discolored.

## 2024-02-11 NOTE — Assessment & Plan Note (Addendum)
 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

## 2024-02-11 NOTE — Progress Notes (Signed)
 Remote pacemaker transmission.

## 2024-02-11 NOTE — Assessment & Plan Note (Addendum)
 Platelets stable SCDs VTE ppx Continue daily prednisone  10 mg

## 2024-02-11 NOTE — ED Triage Notes (Signed)
 Patient in today from home reporting ongoing bilateral leg weakness. Wears O2 currently on 4L [place]. Denies fall.

## 2024-02-11 NOTE — ED Notes (Signed)
[facility] called 

## 2024-02-11 NOTE — Assessment & Plan Note (Addendum)
 Prior to 02-13-2024. A1C in May of 2025 of 8.6  Continue long acting insulin  SSI QAC/HS  Continue jardiance    02-13-2024 continue home insulins.  02-14-2024 CBG stable.   02-15-2024 stable CBG

## 2024-02-12 ENCOUNTER — Observation Stay (HOSPITAL_COMMUNITY)

## 2024-02-12 DIAGNOSIS — R531 Weakness: Secondary | ICD-10-CM | POA: Diagnosis not present

## 2024-02-12 DIAGNOSIS — J9621 Acute and chronic respiratory failure with hypoxia: Secondary | ICD-10-CM | POA: Diagnosis not present

## 2024-02-12 DIAGNOSIS — I669 Occlusion and stenosis of unspecified cerebral artery: Secondary | ICD-10-CM

## 2024-02-12 DIAGNOSIS — R52 Pain, unspecified: Secondary | ICD-10-CM

## 2024-02-12 DIAGNOSIS — I999 Unspecified disorder of circulatory system: Secondary | ICD-10-CM

## 2024-02-12 DIAGNOSIS — I25709 Atherosclerosis of coronary artery bypass graft(s), unspecified, with unspecified angina pectoris: Secondary | ICD-10-CM | POA: Diagnosis not present

## 2024-02-12 DIAGNOSIS — Z66 Do not resuscitate: Secondary | ICD-10-CM | POA: Diagnosis not present

## 2024-02-12 DIAGNOSIS — I671 Cerebral aneurysm, nonruptured: Secondary | ICD-10-CM | POA: Diagnosis not present

## 2024-02-12 DIAGNOSIS — I4811 Longstanding persistent atrial fibrillation: Secondary | ICD-10-CM | POA: Diagnosis not present

## 2024-02-12 DIAGNOSIS — Z515 Encounter for palliative care: Secondary | ICD-10-CM | POA: Diagnosis not present

## 2024-02-12 DIAGNOSIS — D631 Anemia in chronic kidney disease: Secondary | ICD-10-CM | POA: Diagnosis not present

## 2024-02-12 DIAGNOSIS — D693 Immune thrombocytopenic purpura: Secondary | ICD-10-CM | POA: Diagnosis not present

## 2024-02-12 DIAGNOSIS — I6622 Occlusion and stenosis of left posterior cerebral artery: Secondary | ICD-10-CM | POA: Diagnosis not present

## 2024-02-12 LAB — CBC
HCT: 46.3 % (ref 39.0–52.0)
Hemoglobin: 14.6 g/dL (ref 13.0–17.0)
MCH: 27.3 pg (ref 26.0–34.0)
MCHC: 31.5 g/dL (ref 30.0–36.0)
MCV: 86.5 fL (ref 80.0–100.0)
Platelets: 134 K/uL — ABNORMAL LOW (ref 150–400)
RBC: 5.35 MIL/uL (ref 4.22–5.81)
RDW: 15 % (ref 11.5–15.5)
WBC: 8.6 K/uL (ref 4.0–10.5)
nRBC: 0 % (ref 0.0–0.2)

## 2024-02-12 LAB — BASIC METABOLIC PANEL WITH GFR
Anion gap: 12 (ref 5–15)
BUN: 20 mg/dL (ref 8–23)
CO2: 28 mmol/L (ref 22–32)
Calcium: 9.4 mg/dL (ref 8.9–10.3)
Chloride: 99 mmol/L (ref 98–111)
Creatinine, Ser: 1.4 mg/dL — ABNORMAL HIGH (ref 0.61–1.24)
GFR, Estimated: 50 mL/min — ABNORMAL LOW (ref >60)
Glucose, Bld: 148 mg/dL — ABNORMAL HIGH (ref 70–99)
Potassium: 4.5 mmol/L (ref 3.5–5.1)
Sodium: 139 mmol/L (ref 135–145)

## 2024-02-12 LAB — GLUCOSE, CAPILLARY
Glucose-Capillary: 154 mg/dL — ABNORMAL HIGH (ref 70–99)
Glucose-Capillary: 191 mg/dL — ABNORMAL HIGH (ref 70–99)
Glucose-Capillary: 181 mg/dL — ABNORMAL HIGH (ref 70–99)
Glucose-Capillary: 194 mg/dL — ABNORMAL HIGH (ref 70–99)

## 2024-02-12 LAB — T4, FREE: Free T4: 0.65 ng/dL (ref 0.61–1.12)

## 2024-02-12 MED ORDER — POLYVINYL ALCOHOL 1.4 % OP SOLN: 1.0000 [drp] | OPHTHALMIC | Status: DC | PRN

## 2024-02-12 MED ORDER — VITAMIN B-12 100 MCG PO TABS
500.0000 ug | ORAL_TABLET | Freq: Every day | ORAL | Status: DC
  Administered 2024-02-12 – 2024-02-15 (×4): 500 ug via ORAL
  Filled 2024-02-12 (×4): qty 5

## 2024-02-12 MED ORDER — LEVOTHYROXINE SODIUM 25 MCG PO TABS
125.0000 ug | ORAL_TABLET | Freq: Every day | ORAL | Status: DC
  Administered 2024-02-12 – 2024-02-15 (×4): 125 ug via ORAL
  Filled 2024-02-12 (×4): qty 1

## 2024-02-12 MED ORDER — REVEFENACIN 175 MCG/3ML IN SOLN
175.0000 ug | Freq: Every day | RESPIRATORY_TRACT | Status: DC
  Administered 2024-02-12 – 2024-02-15 (×3): 175 ug via RESPIRATORY_TRACT
  Filled 2024-02-12 (×4): qty 3

## 2024-02-12 MED ORDER — PREDNISONE 5 MG PO TABS
10.0000 mg | ORAL_TABLET | Freq: Every day | ORAL | Status: DC
  Administered 2024-02-12 – 2024-02-15 (×4): 10 mg via ORAL
  Filled 2024-02-12 (×4): qty 2

## 2024-02-12 MED ORDER — ALBUTEROL SULFATE (2.5 MG/3ML) 0.083% IN NEBU
2.5000 mg | INHALATION_SOLUTION | Freq: Four times a day (QID) | RESPIRATORY_TRACT | Status: DC | PRN
  Administered 2024-02-12 – 2024-02-15 (×2): 2.5 mg via RESPIRATORY_TRACT
  Filled 2024-02-12 (×2): qty 3

## 2024-02-12 MED ORDER — ATORVASTATIN CALCIUM 40 MG PO TABS
40.0000 mg | ORAL_TABLET | Freq: Every day | ORAL | Status: DC
  Administered 2024-02-12 – 2024-02-15 (×4): 40 mg via ORAL
  Filled 2024-02-12 (×4): qty 1

## 2024-02-12 MED ORDER — LOSARTAN POTASSIUM 50 MG PO TABS
100.0000 mg | ORAL_TABLET | Freq: Every day | ORAL | Status: DC
  Administered 2024-02-12 – 2024-02-15 (×4): 100 mg via ORAL
  Filled 2024-02-12 (×4): qty 2

## 2024-02-12 MED ORDER — FERROUS SULFATE 325 (65 FE) MG PO TABS
324.0000 mg | ORAL_TABLET | Freq: Every day | ORAL | Status: DC
  Filled 2024-02-12: qty 1

## 2024-02-12 MED ORDER — FUROSEMIDE 20 MG PO TABS
20.0000 mg | ORAL_TABLET | Freq: Every day | ORAL | Status: DC
Start: 2024-02-12 — End: 2024-02-15
  Administered 2024-02-12 – 2024-02-15 (×4): 20 mg via ORAL
  Filled 2024-02-12 (×4): qty 1

## 2024-02-12 MED ORDER — ALPRAZOLAM 0.25 MG PO TABS
0.2500 mg | ORAL_TABLET | Freq: Three times a day (TID) | ORAL | Status: DC | PRN
  Administered 2024-02-12 – 2024-02-14 (×4): 0.25 mg via ORAL
  Filled 2024-02-12 (×4): qty 1

## 2024-02-12 MED ORDER — METOCLOPRAMIDE HCL 5 MG/ML IJ SOLN
10.0000 mg | Freq: Once | INTRAMUSCULAR | Status: AC
  Administered 2024-02-12: 10 mg via INTRAVENOUS
  Filled 2024-02-12: qty 2

## 2024-02-12 MED ORDER — CARVEDILOL 3.125 MG PO TABS: 3.1250 mg | ORAL_TABLET | Freq: Two times a day (BID) | ORAL | Status: DC

## 2024-02-12 MED ORDER — TRAZODONE HCL 50 MG PO TABS
50.0000 mg | ORAL_TABLET | Freq: Every day | ORAL | Status: DC
  Administered 2024-02-12 – 2024-02-15 (×5): 50 mg via ORAL
  Filled 2024-02-12 (×5): qty 1

## 2024-02-12 MED ORDER — POLYETHYL GLYCOL-PROPYL GLYCOL 0.4-0.3 % OP SOLN: Freq: Four times a day (QID) | OPHTHALMIC | Status: DC | PRN

## 2024-02-12 MED ORDER — TAMSULOSIN HCL 0.4 MG PO CAPS
0.4000 mg | ORAL_CAPSULE | Freq: Every day | ORAL | Status: DC
  Administered 2024-02-12 – 2024-02-15 (×5): 0.4 mg via ORAL
  Filled 2024-02-12 (×5): qty 1

## 2024-02-12 MED ORDER — GUAIFENESIN 200 MG PO TABS
400.0000 mg | ORAL_TABLET | Freq: Two times a day (BID) | ORAL | Status: DC
  Administered 2024-02-12 – 2024-02-15 (×8): 400 mg via ORAL
  Filled 2024-02-12 (×9): qty 2

## 2024-02-12 MED ORDER — ARFORMOTEROL TARTRATE 15 MCG/2ML IN NEBU
15.0000 ug | INHALATION_SOLUTION | Freq: Two times a day (BID) | RESPIRATORY_TRACT | Status: DC
  Administered 2024-02-12 – 2024-02-15 (×6): 15 ug via RESPIRATORY_TRACT
  Filled 2024-02-12 (×9): qty 2

## 2024-02-12 MED ORDER — ONDANSETRON 4 MG PO TBDP
4.0000 mg | ORAL_TABLET | Freq: Once | ORAL | Status: AC
  Administered 2024-02-12: 4 mg via ORAL
  Filled 2024-02-12: qty 1

## 2024-02-12 MED ORDER — FERROUS SULFATE 325 (65 FE) MG PO TABS
325.0000 mg | ORAL_TABLET | Freq: Every day | ORAL | Status: DC
  Administered 2024-02-12 – 2024-02-14 (×3): 325 mg via ORAL
  Filled 2024-02-12 (×2): qty 1

## 2024-02-12 MED ORDER — ASPIRIN 81 MG PO CHEW
81.0000 mg | CHEWABLE_TABLET | Freq: Every day | ORAL | Status: DC
  Administered 2024-02-12 – 2024-02-15 (×4): 81 mg via ORAL
  Filled 2024-02-12 (×4): qty 1

## 2024-02-12 MED ORDER — PROCHLORPERAZINE EDISYLATE 10 MG/2ML IJ SOLN
10.0000 mg | Freq: Four times a day (QID) | INTRAMUSCULAR | Status: DC | PRN
  Administered 2024-02-12: 10 mg via INTRAVENOUS
  Filled 2024-02-12: qty 2

## 2024-02-12 MED ORDER — HYDRALAZINE HCL 20 MG/ML IJ SOLN
10.0000 mg | Freq: Four times a day (QID) | INTRAMUSCULAR | Status: DC | PRN
  Administered 2024-02-12: 10 mg via INTRAVENOUS
  Filled 2024-02-12: qty 1

## 2024-02-12 MED ORDER — PREGABALIN 100 MG PO CAPS
100.0000 mg | ORAL_CAPSULE | Freq: Every day | ORAL | Status: DC
  Administered 2024-02-12 – 2024-02-15 (×5): 100 mg via ORAL
  Filled 2024-02-12 (×5): qty 1

## 2024-02-12 NOTE — Plan of Care (Signed)

## 2024-02-12 NOTE — Evaluation (Signed)
 Occupational Therapy Evaluation Patient Details Name: [name] MRN: [id number] DOB: 1940/11/21 Today's Date: 02/12/2024   History of Present Illness   83 y.o. male presents to [facility] 02/11/24 with generalized weakness and diarrhea. CTA showed cerebral aneurysm, mild/mod stenosis of L PCA and L SCA, declining MRI. 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 Pt currently presenting with generalized weakness, impaired balance, and decreased activity tolerance. Pt Sp02 stable on supplemental 02 but pt noted to be fatigue with high RR during and following activity. Pt would benefit from further work with acute skilled OT team to build activity tolerance and educate on energy conservation strategies PRN. Patient would benefit from post acute Home OT services to help maximize functional independence in natural environment      If plan is discharge home, recommend the following:   Assistance with cooking/housework;Assist for transportation;A little help with walking and/or transfers;Help with stairs or ramp for entrance     Functional Status Assessment   Patient has had a recent decline in their functional status and demonstrates the ability to make significant improvements in function in a reasonable and predictable amount of time.     Equipment Recommendations   Tub/shower seat     Recommendations for Other Services         Precautions/Restrictions   Precautions Precautions: Fall Recall of Precautions/Restrictions: Intact Restrictions Weight Bearing Restrictions Per Provider Order: No     Mobility Bed Mobility Overal bed mobility: Needs Assistance Bed Mobility: Supine to Sit     Supine to sit: Supervision           Transfers Overall transfer level: Needs assistance Equipment used: Rolling walker (2 wheels) Transfers: Sit to/from Stand Sit to Stand: Mod assist, From elevated surface           General transfer comment: bed was slightly elevated 2, mod A to boost into standing and steady.      Balance Overall balance assessment: Needs assistance Sitting-balance support: Feet supported, No upper extremity supported Sitting balance-Leahy Scale: Good Sitting balance - Comments: good dynamic bal.   Standing balance support: Bilateral upper extremity supported, During functional activity, Reliant on assistive device for balance Standing balance-Leahy Scale: Poor Standing balance comment: RW support                           ADL either performed or assessed with clinical judgement   ADL Overall ADL's : Needs assistance/impaired Eating/Feeding: Independent;Sitting   Grooming: Standing;Contact guard assist Grooming Details (indicate cue type and reason): more assist needed with standing given limited activity tolerance. Upper Body Bathing: Sitting;Set up   Lower Body Bathing: Sitting/lateral leans;Set up   Upper Body Dressing : Set up;Sitting   Lower Body Dressing: Sit to/from stand;Minimal assistance Lower Body Dressing Details (indicate cue type and reason): using figure four to don socks with setup. more assist for STS dressing, needs steady assist. Toilet Transfer: Contact guard assist;Rolling walker (2 wheels);BSC/3in1 Toilet Transfer Details (indicate cue type and reason): limited distance. Toileting- Clothing Manipulation and Hygiene: Contact guard assist;Sit to/from stand       Functional mobility during ADLs: Contact guard assist;Rolling walker (2 wheels)  Vision   Vision Assessment?: No apparent visual deficits     Perception         Praxis         Pertinent Vitals/Pain Pain Assessment Pain Assessment: No/denies pain     Extremity/Trunk  Assessment Upper Extremity Assessment Upper Extremity Assessment: Generalized weakness   Lower Extremity Assessment Lower Extremity Assessment: Defer to PT evaluation       Communication Communication Communication: No apparent difficulties   Cognition Arousal: Alert Behavior During Therapy: WFL for tasks assessed/performed Cognition: No apparent impairments             OT - Cognition Comments: Pt displays lack of aptitude for post acute therapy despite knowing he is weak.                 Following commands: Intact       Cueing  General Comments   Cueing Techniques: Verbal cues  Sp02 91% on 5L following room level ambulation, returned quickly to 95% Sp02, RR 44-48   Exercises     Shoulder Instructions      Home Living Family/patient expects to be discharged to:: Private residence Living Arrangements: Alone Available Help at Discharge: Family;Available 24 hours/day Type of Home: House Home Access: Stairs to enter [employer] of Steps: 1 Entrance Stairs-Rails: None Home Layout: One level     Bathroom Shower/Tub: Chief Strategy Officer: Handicapped height     Home Equipment: Cane - single point;Cane - quad;Grab bars - tub/shower;Electric scooter;Wheelchair - Surveyor, quantity (2 wheels);Rollator (4 wheels);Standard Walker;Hand held shower head   Additional Comments: Uses RW in the home, uses scooter to get to mailbox. Uses power WC in community. Conversion van.      Prior Functioning/Environment Prior Level of Function : Independent/Modified Independent               ADLs Comments: 3L at home, bumps to 4L when needed    OT Problem List: Decreased strength;Impaired balance (sitting and/or standing);Decreased activity tolerance   OT Treatment/Interventions: Self-care/ADL training;Therapeutic exercise;Patient/family education;Balance training;Therapeutic activities;DME and/or AE instruction;Energy conservation       OT Goals(Current goals can be found in the care plan section)   Acute Rehab OT Goals Patient Stated Goal: To get better so he can go home OT Goal Formulation: With patient Time For Goal Achievement: 02/26/24 Potential to Achieve Goals: Good ADL Goals Pt Will Perform Grooming: with modified independence;standing Pt Will Perform Lower Body Dressing: with supervision;sit to/from stand Pt Will Transfer to Toilet: ambulating;with modified independence Pt Will Perform Tub/Shower Transfer: Tub transfer;ambulating;shower seat;with supervision Additional ADL Goal #1: Pt will demonstrate ind use of energy conservation strategies to engage in ADLs/iADLs.   OT Frequency:  Min 2X/week    Co-evaluation PT/OT/SLP Co-Evaluation/Treatment: Yes Reason for Co-Treatment: Complexity of the patient's impairments (multi-system involvement) PT goals addressed during session: Mobility/safety with mobility;Balance OT goals addressed during session: ADL's and self-care      AM-PAC OT 6 Clicks Daily Activity     Outcome Measure Help from another person eating meals?: None Help from another person taking care of personal grooming?: A Little Help from another person toileting, which includes using toliet, bedpan, or urinal?: A Little Help from another person bathing (including washing, rinsing, drying)?: A Little Help from another person to put on and taking off regular upper body clothing?: A Little Help from another person to put on and taking off regular lower body clothing?: A Little 6 Click Score: 19   End  of Session Equipment Utilized During Treatment: Gait belt;Rolling walker (2 wheels) Nurse Communication: Mobility status  Activity Tolerance: Patient tolerated treatment well Patient left: in chair;with call bell/phone within reach;with chair alarm set;with family/visitor present  OT Visit Diagnosis: Unsteadiness on feet (R26.81);Other abnormalities of gait and mobility (R26.89);Muscle  weakness (generalized) (M62.81)                Time: 8841-8776 OT Time Calculation (min): 25 min Charges:  OT General Charges $OT Visit: 1 Visit OT Evaluation $OT Eval Moderate Complexity: 1 Mod  02/12/2024  AB, OTR/L  Acute Rehabilitation Services  Office: [phone]   [name] 02/12/2024, 12:51 PM

## 2024-02-12 NOTE — Consult Note (Signed)
 Chief Complaint: Cerebral Aneurysm  Referring Provider(s): Dr. DELENA Geralds  Supervising Physician: Dolphus Carrion  Patient Status: [facility] - In-pt  History of Present Illness: [name]. is a 83 y.o. male with a PMH significant for COPD (3L [place] at baseline), DMII, HF, HTN, hypothyroidism, RA, Afib. He presented to the ED 7/17 for weakness, balance trouble, shocking sensation in his head and L arm. 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 NIR consulted for treatment planning. He lives alone, uses a walker, is independent with ADLs.   Patient is sleeping at time of exam. Dr. Dolphus spoke primarily with family (daughter) at bedside.   Allergies Reviewed:  Patient has no known allergies.   Past Medical History:  Diagnosis Date   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   ablated   Anxiety    Aortic aneurysm (HCC) 09/02/2017   BENIGN PROSTATIC HYPERTROPHY 11/23/2009   Cardiomyopathy (HCC) 08/28/2016   Chronic systolic CHF (congestive heart failure) (HCC) 09/02/2017   COPD (chronic obstructive pulmonary disease) (HCC)    CORONARY ARTERY DISEASE 11/23/2009   DECREASED HEARING, 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   DEGENERATIVE JOINT DISEASE 11/23/2009   DEPRESSION 11/23/2009   FATIGUE 11/23/2009   GAIT DISTURBANCE 12/10/2009   HEMOPTYSIS UNSPECIFIED 05/07/2010   High cholesterol    HYPERTENSION 07/30/2009   HYPOTHYROIDISM 07/30/2009   Ischemic cardiomyopathy 09/02/2017   LUMBAR RADICULOPATHY, 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   On home oxygen  therapy    2-3L; 24/7 (09/10/2016)   OSA on CPAP    Pneumonia    PTSD (post-traumatic stress disorder) 03/10/2012   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   RA (rheumatoid arthritis) (HCC) 06/11/2011   qwhere (09/10/2016)   RESPIRATORY FAILURE, CHRONIC 07/31/2009   Scleritis of both eyes 03/17/2014   Thrombocytopenia (HCC)    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   Type II diabetes  mellitus (HCC)     Past Surgical History:  Procedure Laterality Date   ABDOMINAL AORTIC ANEURYSM REPAIR  07/2002   thelbert 12/10/2010   ABDOMINAL AORTOGRAM W/LOWER EXTREMITY N/A 08/16/2020   Procedure: ABDOMINAL AORTOGRAM W/LOWER EXTREMITY;  Surgeon: Gretta Lonni PARAS, MD;  Location: [facility];  Service: Cardiovascular;  Laterality: N/A;   ABDOMINAL AORTOGRAM W/LOWER EXTREMITY N/A 10/24/2021   Procedure: ABDOMINAL AORTOGRAM W/LOWER EXTREMITY;  Surgeon: Gretta Lonni PARAS, MD;  Location: [facility];  Service: Cardiovascular;  Laterality: N/A;   ABDOMINAL AORTOGRAM W/LOWER EXTREMITY N/A 03/13/2022   Procedure: ABDOMINAL AORTOGRAM W/LOWER EXTREMITY;  Surgeon: Gretta Lonni PARAS, MD;  Location: [facility];  Service: Cardiovascular;  Laterality: N/A;   ABDOMINAL EXPLORATION SURGERY  02/2004   w/LOA/notes 12/10/2010; small bowel obstruction repair with adhesiolysis    BACK SURGERY     CARDIAC CATHETERIZATION     2 heart caths in the past.  One in 2000s showed one ulcerated plaque  Rx medically; [facility] /notes 09/05/2016   CATARACT EXTRACTION W/ INTRAOCULAR LENS  IMPLANT, BILATERAL Bilateral 2000s   COLECTOMY     hx of remote ileum resection due to bleeding   COLONOSCOPY WITH PROPOFOL  N/A 01/22/2018   Procedure: COLONOSCOPY WITH PROPOFOL ;  Surgeon: Avram Lupita BRAVO, MD;  Location: [facility];  Service: Endoscopy;  Laterality: N/A;   CORONARY ANGIOPLASTY WITH STENT PLACEMENT  09/10/2016   CORONARY STENT INTERVENTION N/A 09/10/2016   Procedure: Coronary Stent Intervention;  Surgeon: Lonni JONETTA Cash,  MD;  Location: [facility];  Service: Cardiovascular;  Laterality: N/A;  Distal RCA 4.0x16 Synergy   ESOPHAGOGASTRODUODENOSCOPY (EGD) WITH PROPOFOL  N/A 01/22/2018   Procedure: ESOPHAGOGASTRODUODENOSCOPY (EGD) WITH PROPOFOL ;  Surgeon: Avram Lupita BRAVO, MD;  Location: [facility];  Service: Endoscopy;  Laterality: N/A;   EYE SURGERY     FALSE ANEURYSM REPAIR Left  08/22/2020   Procedure: EXCLUSION OF LEFT POPLITEAL ARTERY ANEURYSM;  Surgeon: Gretta Lonni PARAS, MD;  Location: [facility] OR;  Service: Vascular;  Laterality: Left;   FEMORAL EMBOLOECTOMY Left 07/2000   with left leg ischemia; Dr. Gerlean, vascular   FEMORAL-POPLITEAL BYPASS GRAFT Bilateral 08/22/2020   Procedure: LEFT FEMORAL-POSTERIOR TIBIAL ARTERY BYPASS GRAFT USING RIGHT GREATER NONREVERSED SAPHENOUS VEIN GRAFT;  Surgeon: Gretta Lonni PARAS, MD;  Location: [facility] OR;  Service: Vascular;  Laterality: Bilateral;   GANGLION CYST EXCISION Right    wrist; Dr. Leonor   HOT HEMOSTASIS N/A 01/22/2018   Procedure: HOT HEMOSTASIS (ARGON PLASMA COAGULATION/BICAP);  Surgeon: Avram Lupita BRAVO, MD;  Location: [facility];  Service: Endoscopy;  Laterality: N/A;   LOOP RECORDER INSERTION N/A 04/25/2019   Procedure: LOOP RECORDER INSERTION;  Surgeon: Fernande Elspeth BROCKS, MD;  Location: [facility] INVASIVE CV LAB;  Service: Cardiovascular;  Laterality: N/A;   LOOP RECORDER REMOVAL N/A 07/01/2019   Procedure: LOOP RECORDER REMOVAL;  Surgeon: Fernande Elspeth BROCKS, MD;  Location: [facility] INVASIVE CV LAB;  Service: Cardiovascular;  Laterality: N/A;   LUMBAR LAMINECTOMY  1972   Dr. Beverley   PACEMAKER IMPLANT N/A 07/01/2019   Procedure: PACEMAKER IMPLANT;  Surgeon: Fernande Elspeth BROCKS, MD;  Location: [facility] INVASIVE CV LAB;  Service: Cardiovascular;  Laterality: N/A;   PERIPHERAL VASCULAR BALLOON ANGIOPLASTY Left 10/24/2021   Procedure: PERIPHERAL VASCULAR BALLOON ANGIOPLASTY;  Surgeon: Gretta Lonni PARAS, MD;  Location: [facility];  Service: Cardiovascular;  Laterality: Left;   RIGHT/LEFT HEART CATH AND CORONARY ANGIOGRAPHY N/A 09/10/2016   Procedure: Right/Left Heart Cath and Coronary Angiography;  Surgeon: Lonni JONETTA Cash, MD;  Location: [facility] INVASIVE CV LAB;  Service: Cardiovascular;  Laterality: N/A;   TONSILLECTOMY        Medications: Prior to Admission medications   Medication Sig Start Date End Date Taking? Authorizing Provider   acetaminophen  (TYLENOL ) 500 MG tablet Take 1,000 mg by mouth every 6 (six) hours as needed for mild pain.   Yes [provider]  albuterol  (PROVENTIL ) (2.5 MG/3ML) 0.083% nebulizer solution Take 3 mLs (2.5 mg total) by nebulization every 6 (six) hours as needed for wheezing or shortness of breath. 12/29/23  Yes Parrett, Tammy S, NP  albuterol  (VENTOLIN  HFA) 108 (90 Base) MCG/ACT inhaler Inhale 1-2 puffs into the lungs every 6 (six) hours as needed for wheezing or shortness of breath. Patient taking differently: Inhale 2 puffs into the lungs every 6 (six) hours as needed for wheezing or shortness of breath. 01/07/24  Yes Parrett, Tammy S, NP  arformoterol  (BROVANA ) 15 MCG/2ML NEBU Take 2 mLs (15 mcg total) by nebulization 2 (two) times daily. 01/21/24  Yes Cindy Garnette POUR, MD  aspirin  81 MG chewable tablet Chew 81 mg by mouth daily.   Yes Rollene Almarie LABOR, MD  Carboxymethylcellul-Glycerin (LUBRICATING EYE DROPS OP) Place 1 drop into both eyes 4 (four) times daily as needed (dry eyes).    Yes [provider]  Cholecalciferol  (VITAMIN D3) 2000 units capsule Take 2,000 Units by mouth daily.  01/11/16  Yes [provider]  cyanocobalamin  (VITAMIN B12) 500 MCG tablet Take 500 mcg by  mouth daily. 07/30/22  Yes [provider]  empagliflozin  (JARDIANCE ) 10 MG TABS tablet Take 1 tablet (10 mg total) by mouth daily before breakfast. 12/23/23  Yes Pietro Redell RAMAN, MD  ferrous sulfate  324 (65 Fe) MG TBEC Take 324 mg by mouth daily with breakfast. 10/02/21  Yes [provider]  furosemide  (LASIX ) 20 MG tablet Take 1 tablet (20 mg total) by mouth daily as needed. Patient taking differently: Take 20 mg by mouth daily as needed for edema or fluid. 12/29/23  Yes Parrett, Tammy S, NP  guaifenesin  (HUMIBID E) 400 MG TABS tablet Take 400 mg by mouth in the morning and at bedtime. 05/23/23  Yes [provider]  insulin  aspart (NOVOLOG ) 100 UNIT/ML injection Inject 6-12  Units into the skin daily as needed for high blood sugar (INCREASE AS NEEDED/AS DIRECTED).   Yes [provider]  insulin  glargine (LANTUS ) 100 UNIT/ML injection Inject 26 Units into the skin 2 (two) times daily.   Yes [provider]  levothyroxine  (SYNTHROID ) 125 MCG tablet Take 125 mcg by mouth daily before breakfast. 12/07/22  Yes [provider]  losartan  (COZAAR ) 100 MG tablet Take 1 tablet (100 mg total) by mouth daily. 12/23/23 03/22/24 Yes Pietro Redell RAMAN, MD  metformin  (FORTAMET ) 500 MG (OSM) 24 hr tablet Take 500 mg by mouth daily with breakfast.   Yes [provider]  Multiple Vitamins-Minerals (MEGA MULTIVITAMIN FOR MEN PO) Take 1 tablet by mouth daily with breakfast.   Yes [provider]  OXYGEN  Inhale 2-4 L/min into the lungs continuous.   Yes [provider]  predniSONE  (DELTASONE ) 10 MG tablet Take 1 tablet (10 mg total) by mouth daily with breakfast. 12/01/23  Yes Odean Potts, MD  pregabalin  (LYRICA ) 100 MG capsule Take 100 mg by mouth at bedtime.   Yes [provider]  revefenacin  (YUPELRI ) 175 MCG/3ML nebulizer solution Take 3 mLs (175 mcg total) by nebulization daily. 01/22/24  Yes Cindy Garnette POUR, MD  Semaglutide  (OZEMPIC , 1 MG/DOSE, [place]) Inject 1 mg into the skin every Friday.   Yes [provider]  tamsulosin  (FLOMAX ) 0.4 MG CAPS capsule Take 1 capsule (0.4 mg total) by mouth at bedtime. 09/14/17  Yes Frann Mabel Mt, DO  atorvastatin  (LIPITOR ) 40 MG tablet Take 1 tablet (40 mg total) by mouth daily. Patient not taking: Reported on 02/12/2024 10/21/16   Okey Vina GAILS, MD  carvedilol  (COREG ) 3.125 MG tablet TAKE 1 TABLET(3.125 MG) BY MOUTH TWICE DAILY WITH A MEAL Patient not taking: Reported on 02/12/2024 07/20/23   Rollene Almarie LABOR, MD  fluticasone  (FLONASE ) 50 MCG/ACT nasal spray Place 1 spray into both nostrils daily as needed for allergies. Patient not taking: Reported on 02/12/2024 12/09/23    Cheryle Page, MD  traZODone  (DESYREL ) 50 MG tablet Take 50 mg by mouth at bedtime. Patient not taking: Reported on 02/12/2024 11/06/22   [provider]     Family History  Adopted: Yes  Problem Relation Age of Onset   Other Mother        gun shot    Social History   Socioeconomic History   Marital status: Widowed    Spouse name: Not on file   Number of children: Not on file   Years of education: Not on file   Highest education level: Not on file  Occupational History   Occupation: disabled veteran, [employer]    Employer: RETIRED  Tobacco Use   Smoking status: Former    Current  packs/day: 0.00    Average packs/day: 2.5 packs/day for 40.0 years (100.0 ttl pk-yrs)    Types: Cigarettes, Pipe, Cigars    Start date: 07/28/1958    Quit date: 07/28/1998    Years since quitting: 25.5    Passive exposure: Never   Smokeless tobacco: Never  Vaping Use   Vaping status: Never Used  Substance and Sexual Activity   Alcohol  use: No    Alcohol /week: 0.0 standard drinks of alcohol    Drug use: No   Sexual activity: Not Currently  Other Topics Concern   Not on file  Social History Narrative   Widowed   Sees [place] every 6 month for meds.   Denies asbestos exposure   Social Drivers of Corporate investment banker Strain: Low Risk  (01/11/2024)   Overall Financial Resource Strain (CARDIA)    Difficulty of Paying Living Expenses: Not hard at all  Food Insecurity: No Food Insecurity (02/05/2024)   Hunger Vital Sign    Worried About Running Out of Food in the Last Year: Never true    Ran Out of Food in the Last Year: Never true  Transportation Needs: No Transportation Needs (02/05/2024)   PRAPARE - Administrator, Civil Service (Medical): No    Lack of Transportation (Non-Medical): No  Physical Activity: Insufficiently Active (01/11/2024)   Exercise Vital Sign    Days of Exercise per Week: 4 days    Minutes of Exercise per Session: 30 min  Stress: No Stress Concern  Present (01/11/2024)   [employer] of Occupational Health - Occupational Stress Questionnaire    Feeling of Stress: Not at all  Social Connections: Socially Isolated (01/19/2024)   Social Connection and Isolation Panel    Frequency of Communication with Friends and Family: More than three times a week    Frequency of Social Gatherings with Friends and Family: Twice a week    Attends Religious Services: Never    Database administrator or Organizations: No    Attends Banker Meetings: Never    Marital Status: Widowed   Physical Exam: Patient sleeping soundly, does not wake to conversation at bedside. Does demonstrate movement in all 4 ext while repositioning himself in his sleep. Wearing 3L O2, NAD. Skin color appropriate for ethnicity.   Vital Signs: BP 126/61   Pulse 66   Temp 97.6 F (36.4 C)   Resp 18   SpO2 92%     Imaging: CT ANGIO HEAD NECK W WO CM Addendum Date: 02/11/2024 ADDENDUM REPORT: 02/11/2024 17:04 ADDENDUM: These results were called by telephone at the time of interpretation on 02/11/2024 at 5:03 pm to provider Dr. Gala, who verbally acknowledged these results. Electronically Signed   By: Evalene Coho M.D.   On: 02/11/2024 17:04   Result Date: 02/11/2024 CLINICAL DATA:  Neuro deficit, acute, stroke suspected EXAM: CT ANGIOGRAPHY HEAD AND NECK WITH AND WITHOUT CONTRAST TECHNIQUE: Multidetector CT imaging of the head and neck was performed using the standard protocol during bolus administration of intravenous contrast. Multiplanar CT image reconstructions and MIPs were obtained to evaluate the vascular anatomy. Carotid stenosis measurements (when applicable) are obtained utilizing NASCET criteria, using the distal internal carotid diameter as the denominator. RADIATION DOSE REDUCTION: This exam was performed according to the departmental dose-optimization program which includes automated exposure control, adjustment of the mA and/or kV according to  patient size and/or use of iterative reconstruction technique. CONTRAST:  75mL OMNIPAQUE  IOHEXOL  350 MG/ML SOLN COMPARISON:  MRI of  the head dated January 21, 2018. FINDINGS: CT HEAD FINDINGS Brain: Age-related cerebral volume loss and mild periventricular and deep cerebral white matter disease. No evidence of hemorrhage, mass, cortical infarct or hydrocephalus. Vascular: Mild calcific atheromatous disease. Skull: Intact. Sinuses/Orbits: No acute process. Other: None. Review of the MIP images confirms the above findings CTA NECK FINDINGS Aortic arch: Mild calcific atheromatous disease. Right carotid system: There is mild calcific and noncalcific plaque present within the distal aspect of the common carotid artery, which results in less than 20% luminal stenosis. There is mild atheromatous disease also present proximally within the internal carotid artery, with less than 10% luminal stenosis. The internal carotid artery is mildly tortuous, but otherwise normal in caliber. Left carotid system: There is calcific and noncalcific plaque within the left carotid bulb and origin of the internal carotid artery. There is mild stenosis, less than 30%. The remainder of the cervical segment of the internal carotid artery is mildly tortuous, but normal in caliber. Vertebral arteries: The right vertebral artery is dominant. There is mild calcific plaque within the V3 segment. There is no significant stenosis. The left vertebral artery is relatively hypoplastic. There is moderate stenosis at its origin, approximately 50%. It is otherwise normal in caliber, although the V4 segment is diminutive. Skeleton: Mild dextroscoliosis of the cervical spine. Other neck: Negative. Upper chest: Emphysema. There is streaky left apical parenchymal scarring. There are calcified right apical pleural plaques and there is left apical pleural thickening. 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 The anterior cerebral arteries are otherwise normal in caliber. There is mild to moderate calcific atheromatous disease within the cranial and cavernous segments of the internal carotid arteries with no significant luminal stenosis. The middle cerebral arteries and their proximal branches are normal in caliber with no evidence of aneurysm, large vessel occlusion or flow-limiting stenosis. Posterior circulation: There is fetal origin of the posterior cerebral arteries bilaterally. There is mild-to-moderate diffuse stenosis of the left posterior cerebral artery. There is mild diffuse irregular stenosis of the right posterior cerebral artery. The basilar artery demonstrates mild stenosis and terminates in the superior cerebellar arteries bilaterally. The left superior cerebellar artery demonstrates diffuse irregular moderate stenosis. The left V4 segment is hypoplastic. Venous sinuses: Patent. Anatomic variants: Fetal origins of both posterior cerebral arteries. Review of the MIP images confirms the above findings IMPRESSION: 1. 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 Recommend consultation of a neuro interventionalist. 2. Fetal origins of both posterior cerebral arteries. 3. Calcific plaque within the carotid bulbs and origins of the internal carotid arteries bilaterally, with less than 30% luminal stenosis. 4. Mild to moderate stenosis of the left posterior cerebral artery and the left superior cerebellar artery. Electronically Signed: By: Evalene Coho M.D. On: 02/11/2024 16:55   DG Chest Portable 1 View Result Date: 02/11/2024 CLINICAL DATA:  Generalized weakness EXAM: PORTABLE CHEST 1 VIEW COMPARISON:  01/22/2024 FINDINGS: Left pacer remains in place, unchanged. Heart mediastinal contours within normal limits.  Calcified pleural plaques noted on the right. Areas of scarring throughout the right lung and in the left lung base. No definite acute process or change from prior study. IMPRESSION: Stable chronic changes.  No definite acute process. Electronically  Signed   By: Franky Crease M.D.   On: 02/11/2024 14:05   DG CHEST PORT 1 VIEW Result Date: 01/22/2024 CLINICAL DATA:  Left pleural effusion EXAM: PORTABLE CHEST 1 VIEW COMPARISON:  Chest radiograph dated 01/21/2024 FINDINGS: Lines/tubes: Left chest wall pacemaker leads project over the right atrium and terminates below the field of view. Lungs: Well inflated lungs. Increased patchy left basilar opacities. Pleura: Unchanged blunting of bilateral costophrenic angles. Unchanged left apicolateral pleural thickening and right pleural calcification. Heart/mediastinum: The heart size and mediastinal contours are within normal limits. Bones: No acute osseous abnormality.  Unchanged left rib fractures. IMPRESSION: 1. Increased patchy left basilar opacities, which may represent atelectasis, aspiration, or pneumonia. 2. Unchanged blunting of bilateral costophrenic angles, which may represent pleural thickening/small pleural effusions. 3. 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 Electronically Signed   By: Limin  Xu M.D.   On: 01/22/2024 15:17   DG Chest Port 1 View Result Date: 01/21/2024 CLINICAL DATA:  Recurrent left pleural effusion EXAM: PORTABLE CHEST 1 VIEW COMPARISON:  01/20/2024 FINDINGS: Stable right-sided volume loss and right basilar and left apical parenchymal scarring. Stable right upper lung zone pleural calcification. Tiny left pleural effusion. Stable biapical pleural thickening. No pneumothorax. 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 Cardiac size within normal limits IMPRESSION: 1. Tiny left pleural effusion. 2. Stable right-sided volume loss and  right basilar and left apical parenchymal scarring. Electronically Signed   By: Dorethia Molt M.D.   On: 01/21/2024 19:42   DG Chest Port 1 View Result Date: 01/20/2024 CLINICAL DATA:  Evaluate pleural effusion. EXAM: PORTABLE CHEST 1 VIEW COMPARISON:  01/19/2024 FINDINGS: Left chest wall pacer device noted with leads in the right atrial appendage and right ventricle. A pigtail thoracostomy tube is noted overlying the left lower lobe. Small residual pleural effusion noted with blunting of the left costophrenic angle. No significant pneumothorax noted. Unchanged pleural calcifications. Advanced changes of COPD/emphysema. Chronic pleuroparenchymal scarring with volume loss in the right lower lung. Right pleural calcifications again seen. Multiple remote left rib fractures. IMPRESSION: 1. Unchanged position of left basilar pigtail thoracostomy tube. Small residual left pleural effusion. 2. No significant pneumothorax. 3. Advanced changes of COPD/emphysema. 4. Chronic pleuroparenchymal scarring with volume loss in the right lower lung. Electronically Signed   By: Waddell Calk M.D.   On: 01/20/2024 09:03   DG Chest Portable 1 View Result Date: 01/19/2024 CLINICAL DATA:  Post chest tube EXAM: PORTABLE CHEST 1 VIEW COMPARISON:  01/19/2024 FINDINGS: Interval placement of left basilar chest tube with evacuation of the left pleural effusion. No visible significant effusion currently. No pneumothorax. Calcified pleural plaques again noted on the right. Heart and mediastinal contours are stable. Left Port-A-Cath remains in place, unchanged. Bibasilar airspace opacities, favor atelectasis. IMPRESSION: Decreasing left effusion following left basilar chest tube placement. No pneumothorax. Bibasilar opacities, favor atelectasis. Electronically Signed   By: Franky Crease M.D.   On: 01/19/2024 17:34   CT Chest W Contrast Result Date: 01/19/2024 CLINICAL DATA:  Pneumonia suspected.  * Tracking Code: BO * EXAM: CT CHEST WITH  CONTRAST TECHNIQUE: Multidetector CT imaging of the chest was performed during intravenous contrast administration. RADIATION DOSE REDUCTION: This exam was performed according to the departmental dose-optimization program which includes automated exposure control, adjustment of the mA and/or kV according to patient size and/or use of iterative reconstruction technique. CONTRAST:  75mL OMNIPAQUE  IOHEXOL  300 MG/ML  SOLN COMPARISON:  Plain film of earlier today. Most recent chest CT 12/03/2023. FINDINGS: Cardiovascular: Pacer. Aneurysm off the inferior transverse aorta is similar at 3.9 cm on 15/2. No surrounding hemorrhage. Tortuous thoracic aorta. Normal heart size, without pericardial effusion. Three vessel coronary artery calcification. No central pulmonary embolism, on this non-dedicated study. Pulmonary artery enlargement, outflow tract 3.8 cm. Mediastinum/Nodes: No mediastinal or hilar adenopathy. Lungs/Pleura: The left apical heterogeneous soft tissue density has been drained in the interval and most likely hematoma. Right-sided pleural calcified plaques. Irregular left-sided complex left pleural thickening, especially superiorly. Increased moderate volume left-sided pleural fluid inferiorly. Left-sided subpleural nodularity/adenopathy in the juxta cardiac space including at 1.9 cm on 47/2, 1.5 cm on the prior. No right-sided pleural fluid. Advanced centrilobular emphysema. Improved left apical aeration with residual scarring and volume loss. Worsened left lower lobe collapse/consolidation. Upper Abdomen: Numerous tiny gallstones. Normal imaged portions of the spleen, stomach, pancreas, adrenal glands. Upper pole left-sided caliectasis on 71/2. Aortic dilatation at the level of the left renal artery origin at 3.8 cm is similar. Musculoskeletal: Old left rib fractures. Minimally displaced posterior seventh left rib fracture. Mild compression deformity at T12 with minimal ventral canal encroachment, similar.  IMPRESSION: 1. 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 Subpleural/left cardiophrenic angle bulky adenopathy, progressive. 2. Increased left pleural fluid with left lower lobe collapse/consolidation. Atelectasis and/or infection. 3. No convincing evidence of primary malignancy within the lungs. 4. Possible upper pole left-sided caliectasis. Correlate with abdominal symptoms and possibly dedicated imaging. 5. 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 6. Right-sided pleural calcified plaques, likely result of remote empyema or hemothorax. 7. Pulmonary artery enlargement suggests pulmonary arterial hypertension. 8. Incidental findings, including: Aortic atherosclerosis (ICD10-I70.0), coronary artery atherosclerosis and emphysema (ICD10-J43.9). Pulmonary artery enlargement suggests pulmonary arterial hypertension. Cholelithiasis. Electronically Signed   By: Rockey Kilts M.D.   On: 01/19/2024 13:44   DG Chest 2 View Result Date: 01/19/2024 CLINICAL DATA:  Shortness of breath, weight gain. EXAM: CHEST - 2 VIEW COMPARISON:  January 07, 2024. FINDINGS: Stable cardiomediastinal silhouette. Left-sided pacemaker is unchanged. Extensive right pleural calcifications are noted suggesting asbestos exposure. Small bilateral pleural effusions are noted. Bilateral lung opacities are noted concerning for infiltrates or atelectasis. Bony thorax is unremarkable. IMPRESSION: Stable right-sided pleural plaques. Mildly increased bibasilar lung opacities are noted concerning for infiltrates or atelectasis. Small pleural effusions. Electronically Signed   By: Lynwood Landy Raddle M.D.   On: 01/19/2024 09:56    Labs:  CBC: Recent Labs    01/21/24 0457 01/22/24 0512 02/11/24 0847 02/12/24 0257  WBC 8.5 7.8 6.9 8.6  HGB 12.0* 12.0* 12.3* 14.6  HCT 40.1 40.3 41.1 46.3  PLT 163 150 124* 134*    COAGS: Recent Labs    12/04/23 0032   INR 1.1    BMP: Recent Labs    01/22/24 0512 01/23/24 0745 02/11/24 0847 02/12/24 0257  NA 137 136 138 139  K 4.2 4.9 4.2 4.5  CL 99 97* 101 99  CO2 30 29 26 28   GLUCOSE 138* 308* 160* 148*  BUN 33* 34* 25* 20  CALCIUM  8.4* 8.5* 9.2 9.4  CREATININE 1.48* 1.34* 1.41* 1.40*  GFRNONAA 47* 53* 50* 50*    LIVER FUNCTION TESTS: Recent Labs    01/21/24 0457 01/22/24 0512 01/23/24 0745 02/11/24 0847  BILITOT 1.0 0.8 0.6 0.9  AST 21 22 23 22   ALT 13 17 16 17   ALKPHOS 49 52 56 63  PROT 5.7* 6.0*  6.3* 7.4  ALBUMIN  2.5* 2.8* 2.9* 3.7    TUMOR MARKERS: No results for input(s): AFPTM, CEA, CA199, CHROMGRNA in the last 8760 hours.  Assessment and Plan:  Dr. Dolphus spoke at length with the family about treatment options and expected disease progression in patients with aneurysms such as his. Given his co morbidities and the reality that intubating the patient for general anesthesia may result in adverse outcome, family agrees that the best option for this patient is surveillance with CTA in 6 mo.   Dr. Dolphus told the family that he is happy to return to bedside Monday to speak to patient directly if he is awake and alert for conversation. Family states they will discuss with the patient, but that he will likely want to continue with plan for surveillance. Patient had previously verbalized to family that he did not want to undergo any procedures unless absolutely necessary. He is DNR/DNI already.   Thank you for allowing our service to participate in Zymere Patlan. 's care.    Electronically Signed: Laymon Coast, NP   02/12/2024, 3:49 PM     I spent a total of 20 Minutes    in face to face in clinical consultation, greater than 50% of which was counseling/coordinating care for cerebral aneurysm consult.    (A copy of this note was sent to the referring provider and the time of visit.)

## 2024-02-12 NOTE — Progress Notes (Signed)
 Unable to do the exam since the patient was in a deep sleep and neither I nor the nurse was able to wake him up. Please try again tomorrow when the patient wakes up

## 2024-02-12 NOTE — Plan of Care (Signed)

## 2024-02-12 NOTE — Progress Notes (Signed)
 Transition of Care [facility]) - Inpatient Brief Assessment   Patient Details  Name: [name]. MRN: [id number] Date of Birth: [birth year]  Transition of Care [facility]) CM/SW Contact:    Rosaline JONELLE Joe, RN Phone Number: 02/12/2024, 2:29 PM   Clinical Narrative: Patient admitted to the hospital with generalized weakness, diarrhea.  Patient  has hstory of COPD and is on home oxygen  at 2-3 L/min [place] oxygen .  I was unable to arouse the patient at the bedside but spoke with the daughter in the room.  The daughter states that the patient would decline home health services.  DME at the home includes [employer], 4 point cane, RW and [employer].  Daughter states that she would provide transportation to home when stable.  No other IP Care management needs at this time and patient should discharge home with stable.   Transition of Care Asessment: Insurance and Status: (P) Insurance coverage has been reviewed Patient has primary care physician: (P) Yes Home environment has been reviewed: (P) from home alone Prior level of function:: (P) Rw, Cane Prior/Current Home Services: (P) No current home services Social Drivers of Health Review: (P) SDOH reviewed interventions complete Readmission risk has been reviewed: (P) Yes Transition of care needs: (P) transition of care needs identified, TOC will continue to follow

## 2024-02-12 NOTE — Consult Note (Addendum)
 NEUROLOGY CONSULT NOTE   Date of service: February 12, 2024 Patient Name: [name] MRN:  [id number] DOB:  Sep 02, 1940 Chief Complaint: Weakness Requesting Provider: Dino Antu, MD  History of Present Illness  [name] is a 83 y.o. 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  About 3 days prior to admission he noticed 3 pound weight gain and appropriately took 20 mg of Lasix  however he concurrently had 6-8 loose watery bowel movements that day as well.  Afterwards he felt dehydrated and tried to compensate with oral fluid intake.  This feeling of dehydration coincided with his increased overall but most prominently lower extremity weakness.  He usually walks with a walker at home without issue but on the day of discharge was unable to ambulate even with the walker.  He denies any other medication changes, other associated symptoms, or progression of his symptoms since admission.  He thinks that his arms are back to baseline but his legs still feel a little weak although he has not gotten up and try to walk yet.  He also had an episode of dizziness and nausea this morning that occurred after he woke up and he thinks it had to do with him turning his head.  No reproduction of this dizziness during exam.    ROS  Comprehensive ROS performed and pertinent positives documented in HPI   Past History   Past Medical History:  Diagnosis Date   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   ablated   Anxiety    Aortic aneurysm (HCC) 09/02/2017   BENIGN PROSTATIC HYPERTROPHY 11/23/2009   Cardiomyopathy (HCC) 08/28/2016   Chronic systolic CHF  (congestive heart failure) (HCC) 09/02/2017   COPD (chronic obstructive pulmonary disease) (HCC)    CORONARY ARTERY DISEASE 11/23/2009   DECREASED HEARING, 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   DEGENERATIVE JOINT DISEASE 11/23/2009   DEPRESSION 11/23/2009   FATIGUE 11/23/2009   GAIT DISTURBANCE 12/10/2009   HEMOPTYSIS UNSPECIFIED 05/07/2010   High cholesterol    HYPERTENSION 07/30/2009   HYPOTHYROIDISM 07/30/2009   Ischemic cardiomyopathy 09/02/2017   LUMBAR RADICULOPATHY, 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   On home oxygen  therapy    2-3L; 24/7 (09/10/2016)   OSA on CPAP    Pneumonia    PTSD (post-traumatic stress disorder) 03/10/2012   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   RA (rheumatoid arthritis) (HCC) 06/11/2011   qwhere (09/10/2016)   RESPIRATORY FAILURE, CHRONIC 07/31/2009   Scleritis of both eyes 03/17/2014   Thrombocytopenia (HCC)    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   Type II diabetes mellitus (HCC)     Past Surgical History:  Procedure Laterality Date   ABDOMINAL AORTIC ANEURYSM REPAIR  07/2002   thelbert 12/10/2010   ABDOMINAL AORTOGRAM W/LOWER EXTREMITY N/A 08/16/2020   Procedure: ABDOMINAL AORTOGRAM W/LOWER EXTREMITY;  Surgeon: Gretta Lonni PARAS, MD;  Location: [facility];  Service: Cardiovascular;  Laterality: N/A;   ABDOMINAL AORTOGRAM W/LOWER EXTREMITY N/A 10/24/2021   Procedure: ABDOMINAL AORTOGRAM W/LOWER EXTREMITY;  Surgeon: Gretta Lonni PARAS, MD;  Location: [facility];  Service: Cardiovascular;  Laterality: N/A;   ABDOMINAL AORTOGRAM W/LOWER EXTREMITY N/A 03/13/2022   Procedure: ABDOMINAL AORTOGRAM W/LOWER EXTREMITY;  Surgeon: Gretta Lonni PARAS, MD;  Location: [facility];  Service: Cardiovascular;  Laterality: N/A;   ABDOMINAL EXPLORATION SURGERY  02/2004   w/LOA/notes 12/10/2010; small bowel obstruction repair with adhesiolysis    BACK SURGERY     CARDIAC CATHETERIZATION     2 heart caths in the past.  One in 2000s showed one ulcerated plaque  Rx medically; Second [facility] /notes 09/05/2016   CATARACT  EXTRACTION W/ INTRAOCULAR LENS  IMPLANT, BILATERAL Bilateral 2000s   COLECTOMY     hx of remote ileum resection due to bleeding   COLONOSCOPY WITH PROPOFOL  N/A 01/22/2018   Procedure: COLONOSCOPY WITH PROPOFOL ;  Surgeon: Avram Lupita BRAVO, MD;  Location: [facility];  Service: Endoscopy;  Laterality: N/A;   CORONARY ANGIOPLASTY WITH STENT PLACEMENT  09/10/2016   CORONARY STENT INTERVENTION N/A 09/10/2016   Procedure: Coronary Stent Intervention;  Surgeon: Lonni JONETTA Cash, MD;  Location: [facility];  Service: Cardiovascular;  Laterality: N/A;  Distal RCA 4.0x16 Synergy   ESOPHAGOGASTRODUODENOSCOPY (EGD) WITH PROPOFOL  N/A 01/22/2018   Procedure: ESOPHAGOGASTRODUODENOSCOPY (EGD) WITH PROPOFOL ;  Surgeon: Avram Lupita BRAVO, MD;  Location: [facility];  Service: Endoscopy;  Laterality: N/A;   EYE SURGERY     FALSE ANEURYSM REPAIR Left 08/22/2020   Procedure: EXCLUSION OF LEFT POPLITEAL ARTERY ANEURYSM;  Surgeon: Gretta Lonni PARAS, MD;  Location[facility] OR;  Service: Vascular;  Laterality: Left;   FEMORAL EMBOLOECTOMY Left 07/2000   with left leg ischemia; Dr. Gerlean, vascular   FEMORAL-POPLITEAL BYPASS GRAFT Bilateral 08/22/2020   Procedure: LEFT FEMORAL-POSTERIOR TIBIAL ARTERY BYPASS GRAFT USING RIGHT GREATER NONREVERSED SAPHENOUS VEIN GRAFT;  Surgeon: Gretta Lonni PARAS, MD;  Location: [facility] OR;  Service: Vascular;  Laterality: Bilateral;   GANGLION CYST EXCISION Right    wrist; Dr. Leonor   HOT HEMOSTASIS N/A 01/22/2018   Procedure: HOT HEMOSTASIS (ARGON PLASMA COAGULATION/BICAP);  Surgeon: Avram Lupita BRAVO, MD;  Location: [facility];  Service: Endoscopy;  Laterality: N/A;   LOOP RECORDER INSERTION N/A 04/25/2019   Procedure: LOOP RECORDER INSERTION;  Surgeon: Fernande Elspeth BROCKS, MD;  Location: [facility] INVASIVE CV LAB;  Service: Cardiovascular;  Laterality: N/A;   LOOP RECORDER REMOVAL N/A 07/01/2019   Procedure: LOOP RECORDER REMOVAL;  Surgeon: Fernande Elspeth BROCKS, MD;  Location: [facility] INVASIVE CV LAB;   Service: Cardiovascular;  Laterality: N/A;   LUMBAR LAMINECTOMY  1972   Dr. Beverley   PACEMAKER IMPLANT N/A 07/01/2019   Procedure: PACEMAKER IMPLANT;  Surgeon: Fernande Elspeth BROCKS, MD;  Location: [facility] INVASIVE CV LAB;  Service: Cardiovascular;  Laterality: N/A;   PERIPHERAL VASCULAR BALLOON ANGIOPLASTY Left 10/24/2021   Procedure: PERIPHERAL VASCULAR BALLOON ANGIOPLASTY;  Surgeon: Gretta Lonni PARAS, MD;  Location: [facility];  Service: Cardiovascular;  Laterality: Left;   RIGHT/LEFT HEART CATH AND CORONARY ANGIOGRAPHY N/A 09/10/2016   Procedure: Right/Left Heart Cath and Coronary Angiography;  Surgeon: Lonni JONETTA Cash, MD;  Location: [facility] INVASIVE CV LAB;  Service: Cardiovascular;  Laterality: N/A;   TONSILLECTOMY      Family History: Family History  Adopted: Yes  Problem Relation Age of Onset   Other Mother        gun shot    Social History  reports that he quit smoking about 25 years ago. His smoking use included cigarettes, pipe, and cigars. He started smoking about 65 years ago. He has a 100 pack-year smoking history. He has never been exposed to tobacco smoke. He has never used smokeless tobacco. He reports that he does not drink alcohol  and does not use drugs.  No Known Allergies  Medications   Current Facility-Administered Medications:    acetaminophen  (TYLENOL ) tablet 650 mg, 650 mg, Oral, Q6H PRN **OR** acetaminophen  (TYLENOL ) suppository 650 mg, 650 mg, Rectal, Q6H PRN, Waddell Rake, MD   albuterol  (PROVENTIL ) (2.5 MG/3ML) 0.083% nebulizer solution 2.5 mg, 2.5 mg, Nebulization, Q6H PRN, Waddell Rake, MD   arformoterol  (BROVANA ) nebulizer solution 15 mcg, 15 mcg, Nebulization, BID, Waddell Rake, MD, 15 mcg at 02/12/24 9170   aspirin  chewable tablet 81 mg, 81 mg, Oral, Daily, Waddell Rake, MD, 81 mg at 02/12/24 9166   atorvastatin  (LIPITOR ) tablet 40 mg, 40 mg, Oral, Daily, Waddell Rake, MD, 40 mg at 02/12/24 9166   ferrous sulfate  tablet 325 mg, 325 mg, Oral, Q  breakfast, Reome, Earle J, RPH, 325 mg at 02/12/24 9165   guaiFENesin  tablet 400 mg, 400 mg, Oral, BID, Waddell Rake, MD, 400 mg at 02/12/24 0831   hydrALAZINE  (APRESOLINE ) injection 10 mg, 10 mg, Intravenous, Q6H PRN, Sundil, Subrina, MD   insulin  aspart (novoLOG ) injection 0-15 Units, 0-15 Units, Subcutaneous, TID WC, Waddell Rake, MD, 3 Units at 02/12/24 9171   levothyroxine  (SYNTHROID ) tablet 125 mcg, 125 mcg, Oral, QAC breakfast, Waddell Rake, MD, 125 mcg at 02/12/24 0551   losartan  (COZAAR ) tablet 100 mg, 100 mg, Oral, Daily, Waddell Rake, MD, 100 mg at 02/12/24 9167   predniSONE  (DELTASONE ) tablet 10 mg, 10 mg, Oral, Q breakfast, Waddell Rake, MD, 10 mg at 02/12/24 9167   pregabalin  (LYRICA ) capsule 100 mg, 100 mg, Oral, QHS, Waddell Rake, MD, 100 mg at 02/12/24 0122   prochlorperazine (COMPAZINE) injection 10 mg, 10 mg, Intravenous, Q6H PRN, Rashid, Farhan, MD, 10 mg at 02/12/24 0830   revefenacin  (YUPELRI ) nebulizer solution 175 mcg, 175 mcg, Nebulization, Daily, Waddell Rake, MD, 175 mcg at 02/12/24 9170   tamsulosin  (FLOMAX ) capsule 0.4 mg, 0.4 mg, Oral, QHS, Waddell Rake, MD, 0.4 mg at 02/12/24 0122   traZODone  (DESYREL ) tablet 50 mg, 50 mg, Oral, QHS, Waddell Rake, MD, 50 mg at 02/12/24 0122   vitamin B-12 (CYANOCOBALAMIN ) tablet 500 mcg, 500 mcg, Oral, Daily, Waddell Rake, MD, 500 mcg at 02/12/24 0833  Vitals   Vitals:   02/12/24 0304 02/12/24 0839 02/12/24 0843 02/12/24 0846  BP: (!) 157/93 (!) 145/78 (!) 145/78   Pulse: 77 (!) 56 (!) 56   Resp: 18   19  Temp: 98.2 F (36.8 C) 97.7 F (36.5 C)    TempSrc: Oral Oral    SpO2: 96% 91% 91% 95%    There is no height or weight on file to calculate BMI.   Physical Exam    Physical Exam Gen: A&Ox4, NAD HEENT: Atraumatic, normocephalic; oropharynx clear, tongue without atrophy or fasciculations. Resp: normal work of breathing on 5 L nasal cannula CV/extremities: Bilateral upper extremities warm  well-perfused, right lower extremity warm and well-perfused, left lower extremity slightly cool and erythematous from the mid shin down  Neuro: *MS: A&O x4. Follows multi-step commands.  *Speech: no dysarthria or aphasia, able to name and repeat. *CN:    I: Deferred   II,III: PERRLA, VFF by confrontation, optic discs not visualized 2/2 pupillary constriction   III,IV,VI: EOMI w/o nystagmus, no ptosis   V: Sensation intact from V1 to V3 to LT   VII: Eyelid closure was full.  Smile symmetric.   VIII: Hearing intact to voice   IX,X: Voice normal, palate elevates symmetrically    XI: SCM/trap 5/5 bilat   XII: Tongue protrudes midline, no atrophy or fasciculations  *Motor:   Normal  bulk.  Significant bilateral upper extremity intention tremor, rigidity or bradykinesia. No pronator drift.  Deltoid strength testing limited by bilateral shoulder pain.   Strength: Dlt Bic Tri WE WrF FgS Gr HF KnF KnE PlF DoF    Left 4- 5 5 5 5 5 5 5 5 5 5 5     Right 4- 5 5 5 5 5 5 5 5 5 5 5    *Sensory: Intact to light touch, pinprick, temperature vibration throughout. Symmetric. Propioception intact bilat.  No double-simultaneous extinction.  *Coordination:  Finger-to-nose, heel-to-shin were intact with bilateral upper extremity intention tremor at the edge of reach. *Reflexes:  2+ and symmetric in bilateral upper extremities and patellar without clonus; unable to elicit bilateral Achilles reflexes.  Toes down-going bilat *Gait: deferred  Labs/Imaging/Neurodiagnostic studies   CBC:  Recent Labs  Lab 03/05/2024 0847 02/12/24 0257  WBC 6.9 8.6  HGB 12.3* 14.6  HCT 41.1 46.3  MCV 89.0 86.5  PLT 124* 134*   Basic Metabolic Panel:  Lab Results  Component Value Date   NA 139 02/12/2024   K 4.5 02/12/2024   CO2 28 02/12/2024   GLUCOSE 148 (H) 02/12/2024   BUN 20 02/12/2024   CREATININE 1.40 (H) 02/12/2024   CALCIUM  9.4 02/12/2024   GFRNONAA 50 (L) 02/12/2024   GFRAA 58 (L) 02/22/2020   Lipid Panel:   Lab Results  Component Value Date   LDLCALC 60 01/02/2023   HgbA1c:  Lab Results  Component Value Date   HGBA1C 8.6 (H) 12/04/2023   Urine Drug Screen: No results found for: LABOPIA, COCAINSCRNUR, LABBENZ, AMPHETMU, THCU, LABBARB  Alcohol  Level No results found for: [facility] INR  Lab Results  Component Value Date   INR 1.1 12/04/2023   APTT  Lab Results  Component Value Date   APTT 33 08/21/2020   AED levels: No results found for: PHENYTOIN, ZONISAMIDE, LAMOTRIGINE, LEVETIRACETA  CT angio Head and Neck with contrast(Personally reviewed): 1. 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 Recommend consultation of a neuro interventionalist. 2. Fetal origins of both posterior cerebral arteries. 3. Calcific plaque within the carotid bulbs and origins of the internal carotid arteries bilaterally, with less than 30% luminal stenosis. 4. Mild to moderate stenosis of the left posterior cerebral artery and the left superior cerebellar artery.  ASSESSMENT   [name] is a 83 y.o. 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  Weakness was reported most significantly in his legs and precluded him from being able to ambulate with a walker the day of admission.  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  Symptoms occurred in the setting of acute diarrheal illness and as needed diuretic use that possibly caused some dehydration however on admission he did not have an AKI or any significant  electrolyte abnormalities.  No leukocytosis, significant anemia, or evidence of UTI on UA.  BNP was elevated to 313, chest x-ray without signs of vascular congestion, troponin was flat at 30, and EKG was stable from prior.  He is requiring 5 L of supplemental oxygen  through nasal cannula which is up from his baseline of 3 but denies any difficulty breathing during his symptom onset or since admission.  TSH was significantly elevated at 35.7 but his free T4 was normal at 0.65.  Vascular surgery was consulted due to his history of left leg femoral-tibial bypass and did not find any worrisome signs or symptoms of acute vascular compromise as the cause of his weakness and recommended continuing his current medical therapy and having the patient follow-up outpatient.  He has not yet worked with physical therapy or tried to ambulate while at the hospital.  Most likely this patient with multiple significant comorbid conditions became dehydrated on top of chronic deconditioning and debility which unmasked significant mobility issues.  On my exam other than some decreased sensation in his left foot and ankle which is better explained by his severe vascular disease and bilateral deltoid weakness due to pain he does not have any significant neurologic deficits. This presentation is not consistent with stroke.   RECOMMENDATIONS  Agree with neuro IR evaluation for berry aneurysm Continue aspirin  81 mg daily and atorvastatin  40 mg daily, patient does not need addition of P2 Y12 inhibitor for mild to moderate intracranial stenosis Agree with PT/OT eval Neurology will sign off, please reach out with questions   ______________________________________________________________________    Signed, Fairy Pool, DO Internal Medicine Resident, PGY-3 10:12 AM 02/12/2024   Attending Neurohospitalist Addendum Patient seen and examined with APP/Resident. Agree with the history and physical as documented above. Agree with the plan as documented, which I helped formulate. I have edited the note above to reflect my full findings and recommendations. I have independently reviewed the chart, obtained history, review of systems and examined the patient.I have personally reviewed pertinent head/neck/spine imaging (CT/MRI). Please feel free to call with any questions.  -- Elida Ross, MD [facility] [phone]  If 7pm- 7am, please page neurology on call as listed in AMION.

## 2024-02-12 NOTE — Progress Notes (Signed)
 PROGRESS NOTE    [name]  FMW:[id number] DOB: 05/11/1941 DOA: 02/11/2024 PCP: Rollene Almarie LABOR, MD   Brief Narrative:  83 y.o. 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    Assessment & Plan:  83 y.o. 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 Per vascular note he has not been symptomatic. Not weaker on left side.   New finding of absent DP doppler per EDP  Vascular surgeon, Dr Magda consulted. Recommendation is medical management. No evidence of acute limb threatening ischemia.  Generalized weakness/Physical deconditioning: ordered PT/OT evaluations Concern for possible CVA but patient refused MRI brain  CTA head/neck with no occlusion, but noted cerebral aneurysm and mild to moderate stenosis of the left posterior cerebral artery and the left superior cerebellar artery.  TSH is elevated, continue with synthroid  B12 is normal F/u vit D level  Hypothyroidism: continue with synthroid . F/u repeat TSH in 4-6 weeks  Type 2 DM,  insulin  dependent: Continue with current regimen  CAD s/p CABG: No acute issues  Possible diastolic CHF: Ordered ECHO. Patient does have elevated BNP  Long standing persistent atrial fibrillation: Continue with aspirin , Not on anticoagulation. Continue with coreg .  ITP: Counts are stable. Continue with prednisone   COPD with chronic hypoxic respiratory failure:  Continue breathing treatments.  CKD 3 A: continue monitoring BMP  Anemia of chronic kidney disease: no acute issues  OSA: On CPAP  Disposition: may need SNF  DVT prophylaxis: SCDs Start: 02/11/24 1620     Code Status: Limited: Do not attempt resuscitation (DNR) -DNR-LIMITED -Do Not Intubate/DNI  Family Communication:   Status is: Observation The patient remains OBS appropriate and will d/c before 2 midnights.    Subjective:  He has a myriad of complaints including weakness, balance issues, dizziness, shakiness and poor oral intake for the past few days. He told me that he lives at home by himself but hasn't been able to take care of himself for the past two weeks. He is also nauseous this morning and dry heaving.  Examination:  General exam: Mild distress, nauseous, nasal oxygen  cannula in place Respiratory system: Clear to auscultation. Respiratory effort normal. Cardiovascular system: S1 & S2 heard, RRR. No JVD, murmurs, rubs, gallops or clicks. No pedal edema. Gastrointestinal system: Abdomen is nondistended, soft and nontender. No organomegaly or masses felt. Normal bowel sounds heard. Central nervous system: Alert and oriented. No focal neurological deficits. Extremities: 4/5 power bilaterally, resting tremor of upper extremities Skin: No rashes, lesions or ulcers      Diet Orders (From admission, onward)     Start     Ordered   02/11/24 1621  Diet heart healthy/carb  modified Room service appropriate? Yes; Fluid consistency: Thin  Diet effective now       Question Answer Comment  Diet-HS Snack?  Nothing   Room service appropriate? Yes   Fluid consistency: Thin      02/11/24 1620            Objective: Vitals:   02/12/24 0304 02/12/24 0839 02/12/24 0843 02/12/24 0846  BP: (!) 157/93 (!) 145/78 (!) 145/78   Pulse: 77 (!) 56 (!) 56   Resp: 18   19  Temp: 98.2 F (36.8 C) 97.7 F (36.5 C)    TempSrc: Oral Oral    SpO2: 96% 91% 91% 95%    Intake/Output Summary (Last 24 hours) at 02/12/2024 1057 Last data filed at 02/12/2024 1000 Gross per 24 hour  Intake 360 ml  Output 550 ml  Net -190 ml   There were no vitals filed for this visit.  Scheduled Meds:  arformoterol   15 mcg Nebulization BID   aspirin   81 mg Oral Daily   atorvastatin   40 mg Oral Daily   ferrous sulfate   325 mg Oral Q breakfast   guaiFENesin   400 mg Oral BID   insulin  aspart  0-15 Units Subcutaneous TID WC   levothyroxine   125 mcg Oral QAC breakfast   losartan   100 mg Oral Daily   predniSONE   10 mg Oral Q breakfast   pregabalin   100 mg Oral QHS   revefenacin   175 mcg Nebulization Daily   tamsulosin   0.4 mg Oral QHS   traZODone   50 mg Oral QHS   cyanocobalamin   500 mcg Oral Daily   Continuous Infusions:  Nutritional status     There is no height or weight on file to calculate BMI.  Data Reviewed:   CBC: Recent Labs  Lab 02/11/24 0847 02/12/24 0257  WBC 6.9 8.6  HGB 12.3* 14.6  HCT 41.1 46.3  MCV 89.0 86.5  PLT 124* 134*   Basic Metabolic Panel: Recent Labs  Lab 02/11/24 0847 02/11/24 1744 02/12/24 0257  NA 138  --  139  K 4.2  --  4.5  CL 101  --  99  CO2 26  --  28  GLUCOSE 160*  --  148*  BUN 25*  --  20  CREATININE 1.41*  --  1.40*  CALCIUM  9.2  --  9.4  MG  --  1.8  --    GFR: Estimated Creatinine Clearance: 47.3 mL/min (A) (by C-G formula based on SCr of 1.4 mg/dL (H)). Liver Function Tests: Recent Labs  Lab 02/11/24 0847  AST 22  ALT 17  ALKPHOS 63  BILITOT 0.9  PROT 7.4  ALBUMIN  3.7   No results for input(s): LIPASE, AMYLASE in the last 168  hours. No results for input(s): AMMONIA in the last 168 hours. Coagulation Profile: No results for input(s): INR, PROTIME in the last 168 hours. Cardiac Enzymes: No results for input(s): CKTOTAL, CKMB, CKMBINDEX, TROPONINI in the last 168 hours. BNP (last 3 results) No results for input(s): PROBNP in the last 8760 hours. HbA1C: No results for input(s): HGBA1C in the last 72 hours. CBG: Recent Labs  Lab 02/11/24 1003 02/11/24 1732 02/12/24 0757  GLUCAP 170* 145* 154*   Lipid Profile: No results for input(s): CHOL, HDL, LDLCALC, TRIG, CHOLHDL, LDLDIRECT in the last 72 hours. Thyroid  Function Tests: Recent Labs    02/11/24 1744 02/12/24 0257  TSH 35.783*  --   FREET4  --  0.65   Anemia Panel: Recent  Labs    02/11/24 1744  VITAMINB12 664   Sepsis Labs: No results for input(s): PROCALCITON, LATICACIDVEN in the last 168 hours.  No results found for this or any previous visit (from the past 240 hours).       Radiology Studies: CT ANGIO HEAD NECK W WO CM Addendum Date: 02/11/2024 ADDENDUM REPORT: 02/11/2024 17:04 ADDENDUM: These results were called by telephone at the time of interpretation on 02/11/2024 at 5:03 pm to provider Dr. Gala, who verbally acknowledged these results. Electronically Signed   By: Evalene Coho M.D.   On: 02/11/2024 17:04   Result Date: 02/11/2024 CLINICAL DATA:  Neuro deficit, acute, stroke suspected EXAM: CT ANGIOGRAPHY HEAD AND NECK WITH AND WITHOUT CONTRAST TECHNIQUE: Multidetector CT imaging of the head and neck was performed using the standard protocol during bolus administration of intravenous contrast. Multiplanar CT image reconstructions and MIPs were obtained to evaluate the vascular anatomy. Carotid stenosis measurements (when applicable) are obtained utilizing NASCET criteria, using the distal internal carotid diameter as the denominator. RADIATION DOSE REDUCTION: This exam was performed according to the  departmental dose-optimization program which includes automated exposure control, adjustment of the mA and/or kV according to patient size and/or use of iterative reconstruction technique. CONTRAST:  75mL OMNIPAQUE  IOHEXOL  350 MG/ML SOLN COMPARISON:  MRI of the head dated January 21, 2018. FINDINGS: CT HEAD FINDINGS Brain: Age-related cerebral volume loss and mild periventricular and deep cerebral white matter disease. No evidence of hemorrhage, mass, cortical infarct or hydrocephalus. Vascular: Mild calcific atheromatous disease. Skull: Intact. Sinuses/Orbits: No acute process. Other: None. Review of the MIP images confirms the above findings CTA NECK FINDINGS Aortic arch: Mild calcific atheromatous disease. Right carotid system: There is mild calcific and noncalcific plaque present within the distal aspect of the common carotid artery, which results in less than 20% luminal stenosis. There is mild atheromatous disease also present proximally within the internal carotid artery, with less than 10% luminal stenosis. The internal carotid artery is mildly tortuous, but otherwise normal in caliber. Left carotid system: There is calcific and noncalcific plaque within the left carotid bulb and origin of the internal carotid artery. There is mild stenosis, less than 30%. The remainder of the cervical segment of the internal carotid artery is mildly tortuous, but normal in caliber. Vertebral arteries: The right vertebral artery is dominant. There is mild calcific plaque within the V3 segment. There is no significant stenosis. The left vertebral artery is relatively hypoplastic. There is moderate stenosis at its origin, approximately 50%. It is otherwise normal in caliber, although the V4 segment is diminutive. Skeleton: Mild dextroscoliosis of the cervical spine. Other neck: Negative. Upper chest: Emphysema. There is streaky left apical parenchymal scarring. There are calcified right apical pleural plaques and there is left  apical pleural thickening. Review of the MIP images confirms the above findings CTA HEAD FINDINGS Anterior circulation: There is a 4 x 3 x 3 mm aneurysm arising superior laterally from the A2 segment of the left cerebral artery, which is seen on image 255 of series 14. The anterior cerebral arteries are otherwise normal in caliber. There is mild to moderate calcific atheromatous disease within the cranial and cavernous segments of the internal carotid arteries with no significant luminal stenosis. The middle cerebral arteries and their proximal branches are normal in caliber with no evidence of aneurysm, large vessel occlusion or flow-limiting stenosis. Posterior circulation: There is fetal origin of the posterior cerebral arteries bilaterally. There is mild-to-moderate diffuse stenosis of the left posterior  cerebral artery. There is mild diffuse irregular stenosis of the right posterior cerebral artery. The basilar artery demonstrates mild stenosis and terminates in the superior cerebellar arteries bilaterally. The left superior cerebellar artery demonstrates diffuse irregular moderate stenosis. The left V4 segment is hypoplastic. Venous sinuses: Patent. Anatomic variants: Fetal origins of both posterior cerebral arteries. Review of the MIP images confirms the above findings IMPRESSION: 1. 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 Recommend consultation of a neuro interventionalist. 2. Fetal origins of both posterior cerebral arteries. 3. Calcific plaque within the carotid bulbs and origins of the internal carotid arteries bilaterally, with less than 30% luminal stenosis. 4. Mild to moderate stenosis of the left posterior cerebral artery and the left superior cerebellar artery. Electronically Signed: By: Evalene Coho M.D. On: 02/11/2024 16:55   DG Chest Portable 1 View Result Date: 02/11/2024 CLINICAL DATA:  Generalized weakness EXAM: PORTABLE CHEST 1 VIEW  COMPARISON:  01/22/2024 FINDINGS: Left pacer remains in place, unchanged. Heart mediastinal contours within normal limits. Calcified pleural plaques noted on the right. Areas of scarring throughout the right lung and in the left lung base. No definite acute process or change from prior study. IMPRESSION: Stable chronic changes.  No definite acute process. Electronically Signed   By: Franky Crease M.D.   On: 02/11/2024 14:05       LOS: 0 days   Time spent= 45 mins    Deliliah Room, MD [facility]  If 7PM-7AM, please contact night-coverage  02/12/2024, 10:57 AM

## 2024-02-12 NOTE — Evaluation (Signed)
 Physical Therapy Evaluation Patient Details Name: [name] MRN: [id number] DOB: [birth year] Today's Date: 02/12/2024  History of Present Illness  83 y.o. male presents to [facility] 02/11/24 with generalized weakness and diarrhea. CTA showed cerebral aneurysm, mild/mod stenosis of L PCA and L SCA, declining MRI. 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 PTA, pt was ModI with RW for mobility in the home and would use electric scooter or power WC in the community. In today's session, pt was supervision for bed mobility and ModA to stand with RW. Limited gait distance due to feeling SOB with high levels of fatigue. Pt able to ambulate ~15 ft with RW and CGA. Pt typically lives alone, however, family in room reported that they could provide 24/7 physical assist upon d/c from the hospital. Discussed proper guarding with use of gait belt with family verbalizing understanding. Recommending post-acute HHPT to work towards independence with mobility. Pt would benefit from acute skilled PT with current functional limitations listed below (see PT Problem List). Acute PT to follow.         If plan is discharge home, recommend the following: A lot of help with walking and/or transfers;A lot of help with bathing/dressing/bathroom;Assistance with cooking/housework;Assist for transportation;Help with stairs or ramp for entrance   Can travel by private vehicle    Yes    Equipment Recommendations None recommended by PT     Functional Status Assessment Patient has had a recent decline in their functional status and demonstrates the ability to make significant improvements in function in a reasonable and predictable amount of time.     Precautions / Restrictions Precautions Precautions: Fall Recall of  Precautions/Restrictions: Intact Restrictions Weight Bearing Restrictions Per Provider Order: No      Mobility  Bed Mobility Overal bed mobility: Needs Assistance Bed Mobility: Supine to Sit    Supine to sit: Supervision       Transfers Overall transfer level: Needs assistance Equipment used: Rolling walker (2 wheels) Transfers: Sit to/from Stand Sit to Stand: Mod assist, From elevated surface  General transfer comment: ModA for boost-up and slight steadying assist. Bed slightly elevated 2    Ambulation/Gait Ambulation/Gait assistance: Contact guard assist Gait Distance (Feet): 15 Feet Assistive device: Rolling walker (2 wheels) Gait Pattern/deviations: Step-through pattern, Decreased stride length, Trunk flexed Gait velocity: decr     General Gait Details: forward flexed trunk with short steps, slightly unsteady however no overt LOB. CGA for safety    Balance Overall balance assessment: Needs assistance Sitting-balance support: Feet supported, No upper extremity supported Sitting balance-Leahy Scale: Good     Standing balance support: Bilateral upper extremity supported, During functional activity, Reliant on assistive device for balance Standing balance-Leahy Scale: Poor Standing balance comment: RW support        Pertinent Vitals/Pain Pain Assessment Pain Assessment: No/denies pain    Home Living Family/patient expects to be discharged to:: Private residence Living Arrangements: Alone Available Help at Discharge: Family;Available 24 hours/day Type of Home: House Home Access: Stairs to enter Entrance Stairs-Rails: None Entrance Stairs-Number of Steps: 1   Home Layout: One level Home Equipment: Cane - single point;Cane - quad;Grab bars - tub/shower;Electric scooter;Wheelchair - Surveyor, quantity (2 wheels);Rollator (4 wheels);Standard Walker;Hand held shower head Additional Comments: Uses RW in the home, uses  scooter to get to mailbox. Uses power WC in  community. Conversion van.    Prior Function Prior Level of Function : Independent/Modified Independent      Mobility Comments: Denies falls, Uses RW in the home and scooter to get to mailbox. Uses power WC in community. Has a conversion van. ADLs Comments: 3L at home, bumps to 4L when needed     Extremity/Trunk Assessment   Upper Extremity Assessment Upper Extremity Assessment: Defer to OT evaluation    Lower Extremity Assessment Lower Extremity Assessment: Generalized weakness       Communication   Communication Communication: No apparent difficulties    Cognition Arousal: Alert Behavior During Therapy: WFL for tasks assessed/performed   PT - Cognitive impairments: No apparent impairments    Following commands: Intact       Cueing Cueing Techniques: Verbal cues     General Comments General comments (skin integrity, edema, etc.): Sp02 91% on 5L following room level ambulation, returned quickly to 95% Sp02, RR 44-48     PT Assessment Patient needs continued PT services  PT Problem List Decreased strength;Decreased activity tolerance;Decreased balance;Decreased mobility       PT Treatment Interventions DME instruction;Gait training;Stair training;Functional mobility training;Therapeutic activities;Therapeutic exercise;Balance training;Neuromuscular re-education;Patient/family education    PT Goals (Current goals can be found in the Care Plan section)  Acute Rehab PT Goals Patient Stated Goal: to go home PT Goal Formulation: With patient/family Time For Goal Achievement: 02/26/24 Potential to Achieve Goals: Good    Frequency Min 2X/week     Co-evaluation   Reason for Co-Treatment: Complexity of the patient's impairments (multi-system involvement) PT goals addressed during session: Mobility/safety with mobility;Balance OT goals addressed during session: ADL's and self-care       AM-PAC PT 6 Clicks Mobility  Outcome Measure Help needed turning from  your back to your side while in a flat bed without using bedrails?: None Help needed moving from lying on your back to sitting on the side of a flat bed without using bedrails?: A Little Help needed moving to and from a bed to a chair (including a wheelchair)?: A Lot Help needed standing up from a chair using your arms (e.g., wheelchair or bedside chair)?: A Lot Help needed to walk in hospital room?: A Little Help needed climbing 3-5 steps with a railing? : A Lot 6 Click Score: 16    End of Session Equipment Utilized During Treatment: Gait belt;Oxygen  Activity Tolerance: Patient tolerated treatment well Patient left: in chair;with call bell/phone within reach;with chair alarm set;with family/visitor present Nurse Communication: Mobility status PT Visit Diagnosis: Unsteadiness on feet (R26.81);Other abnormalities of gait and mobility (R26.89);Muscle weakness (generalized) (M62.81)    Time: 8841-8774 PT Time Calculation (min) (ACUTE ONLY): 27 min   Charges:   PT Evaluation $PT Eval Low Complexity: 1 Low   PT General Charges $$ ACUTE PT VISIT: 1 Visit    Kate ORN, PT, DPT Secure Chat Preferred  Rehab Office [phone]   Kate BRAVO Wendolyn 02/12/2024, 1:06 PM

## 2024-02-13 ENCOUNTER — Observation Stay (HOSPITAL_COMMUNITY)

## 2024-02-13 ENCOUNTER — Other Ambulatory Visit: Payer: Self-pay

## 2024-02-13 DIAGNOSIS — J9611 Chronic respiratory failure with hypoxia: Secondary | ICD-10-CM

## 2024-02-13 DIAGNOSIS — J432 Centrilobular emphysema: Secondary | ICD-10-CM

## 2024-02-13 DIAGNOSIS — I10 Essential (primary) hypertension: Secondary | ICD-10-CM

## 2024-02-13 DIAGNOSIS — R0609 Other forms of dyspnea: Secondary | ICD-10-CM | POA: Diagnosis not present

## 2024-02-13 DIAGNOSIS — I3481 Nonrheumatic mitral (valve) annulus calcification: Secondary | ICD-10-CM | POA: Diagnosis not present

## 2024-02-13 DIAGNOSIS — D631 Anemia in chronic kidney disease: Secondary | ICD-10-CM | POA: Diagnosis not present

## 2024-02-13 DIAGNOSIS — N1831 Chronic kidney disease, stage 3a: Secondary | ICD-10-CM

## 2024-02-13 DIAGNOSIS — I671 Cerebral aneurysm, nonruptured: Secondary | ICD-10-CM | POA: Diagnosis not present

## 2024-02-13 DIAGNOSIS — E039 Hypothyroidism, unspecified: Secondary | ICD-10-CM

## 2024-02-13 DIAGNOSIS — Z9889 Other specified postprocedural states: Secondary | ICD-10-CM

## 2024-02-13 DIAGNOSIS — Z794 Long term (current) use of insulin: Secondary | ICD-10-CM

## 2024-02-13 DIAGNOSIS — N1832 Chronic kidney disease, stage 3b: Secondary | ICD-10-CM

## 2024-02-13 DIAGNOSIS — E118 Type 2 diabetes mellitus with unspecified complications: Secondary | ICD-10-CM

## 2024-02-13 DIAGNOSIS — R531 Weakness: Secondary | ICD-10-CM

## 2024-02-13 DIAGNOSIS — Z515 Encounter for palliative care: Secondary | ICD-10-CM | POA: Diagnosis not present

## 2024-02-13 DIAGNOSIS — Z66 Do not resuscitate: Secondary | ICD-10-CM

## 2024-02-13 DIAGNOSIS — I480 Paroxysmal atrial fibrillation: Secondary | ICD-10-CM | POA: Diagnosis not present

## 2024-02-13 LAB — ECHOCARDIOGRAM COMPLETE
AR max vel: 2.91 cm2
AV Area VTI: 2.98 cm2
AV Area mean vel: 2.79 cm2
AV Mean grad: 2 mmHg
AV Peak grad: 4.9 mmHg
Ao pk vel: 1.11 m/s
Area-P 1/2: 2.74 cm2
S' Lateral: 3.96 cm

## 2024-02-13 LAB — GLUCOSE, CAPILLARY
Glucose-Capillary: 185 mg/dL — ABNORMAL HIGH (ref 70–99)
Glucose-Capillary: 169 mg/dL — ABNORMAL HIGH (ref 70–99)
Glucose-Capillary: 123 mg/dL — ABNORMAL HIGH (ref 70–99)
Glucose-Capillary: 90 mg/dL (ref 70–99)

## 2024-02-13 LAB — VITAMIN D 25 HYDROXY (VIT D DEFICIENCY, FRACTURES): Vit D, 25-Hydroxy: 37.21 ng/mL (ref 30–100)

## 2024-02-13 MED ORDER — FUROSEMIDE 20 MG PO TABS: 20.0000 mg | ORAL_TABLET | Freq: Every day | ORAL | 0 refills | Status: DC

## 2024-02-13 MED ORDER — ONDANSETRON 4 MG PO TBDP: 4.0000 mg | ORAL_TABLET | Freq: Four times a day (QID) | ORAL | 0 refills | Status: DC | PRN

## 2024-02-13 NOTE — Subjective & Objective (Signed)
 Pt seen and examined. Notes from consultation reviewed. No interventions are needed during this hospitalization.  PT/OT assessments reviewed. Pt is stable to go home.

## 2024-02-13 NOTE — Hospital Course (Signed)
 HPI:  [name] is a 83 y.o. 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 He had diarrhea about 3 days ago and had one day with about 4 episodes of watery diarrhea. He felt a little bit more weak after this and he increased his water intake. He then thought he over drank and took some extra diuretics. He got up yesterday and he felt a little weak and had more trouble maneuvering his walker which is new for him. He seemed to get weaker as the day progressed. Today he couldn't really walk well and had some strange feelings behind his right ear. He had pain shooting down his left arm that felt like little shocks. He also had a headache. With the weakness he came to ED.    Baseline he is independent with all ADLs. Still cooks his own meals, showers, and drives. His daughter comes once a week and will help or run errands with him if he needs anything. He does all of his own finances.    Admitted 01/19/24 for acute on chronic respiratory failure in setting of severe COPD and recurrent moderate left pleural effusion of unclear etiology. Chest tube placed and removed 6/26. Cytology negative for malignant cells.      Denies any fever/chills, vision changes/headaches, chest pain or palpitations, shortness of breath or cough, abdominal pain, N/V/D, dysuria or leg swelling.      He does not smoke or drink alcohol .   Significant Events: Admitted 02/11/2024 for generalized weakness 02-15-2024 MRI brain negative for acute CVA. Dtr elected to make pt terminal care. 2024-03-12 pt expired at 0955.  Admission Labs: WBC 6.9, HgB 12.3, plt 124 Na  138, K 4.2, CO2 of 26, BUN 25, Scr 1.41 UA glu >500, neg nitrite, Neg LE BNP 313 TSH 35.78 B12 664  Admission Imaging Studies: CXR Stable chronic changes. 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  recommend consultation of a neuro interventionalist. 2. Fetal origins of both posterior cerebral arteries. 3. Calcific plaque within the carotid bulbs and origins of the internal carotid arteries bilaterally, with less than 30% luminal stenosis. 4. Mild to moderate stenosis of the left posterior cerebral artery and the left superior cerebellar artery  Significant Labs: FT4 0.65, TSH 35  Significant Imaging Studies:   Antibiotic Therapy: Anti-infectives (From admission, onward)    None       Procedures:   Consultants: Vascular surgery Neurology Neuro-Interventional Radiology

## 2024-02-13 NOTE — Assessment & Plan Note (Signed)
 Verified DNR/DNI with pt's dtr [name].

## 2024-02-13 NOTE — Progress Notes (Signed)
 PT Cancellation Note  Patient Details Name: [name]. MRN: [id number] DOB: [birth year]   Cancelled Treatment:    Reason Eval/Treat Not Completed: Patient at procedure or test/unavailable (Pt receiving ECHO. Will follow up later if time allows.)   Savonna Birchmeier 02/13/2024, 11:05 AM

## 2024-02-13 NOTE — Plan of Care (Signed)

## 2024-02-13 NOTE — Discharge Summary (Addendum)
 [facility] Physician Discharge Summary   Patient name: [name].  Admit date:     02/11/2024  Discharge date: 02/13/2024  Attending Physician: WADDELL RAKE [[id number]]  Discharge Physician: Camellia Door   PCP: Rollene Almarie LABOR, MD  Admitted From: Home  Disposition:  Home  Recommendations for Outpatient Follow-up:  Follow up with PCP in 1-2 weeks Please follow up on the following pending results:  Home Health:No. Pt and family refused home health services Equipment/Devices: Oxygen  3-4 L/min. Chronically.  Discharge Condition:Stable CODE STATUS:DNR/DNI Diet recommendation: Heart Healthy/Diabetic Fluid Restriction: None  Hospital Summary: HPI:  [name] is a 83 y.o. 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 He had diarrhea about 3 days ago and had one day with about 4 episodes of watery diarrhea. He felt a little bit more weak after this and he increased his water intake. He then thought he over drank and took some extra diuretics. He got up yesterday and he felt a little weak and had more trouble maneuvering his walker which is new for him. He seemed to get weaker as the day progressed. Today he couldn't really walk well and had some strange feelings behind his right ear. He had pain shooting down his left arm that felt like little shocks. He also had a headache. With the weakness he came to ED.    Baseline he is independent with all ADLs. Still cooks his own meals, showers, and drives. His daughter comes once a week and will help or run errands with him if he needs anything. He does all of  his own finances.    Admitted 01/19/24 for acute on chronic respiratory failure in setting of severe COPD and recurrent moderate left pleural effusion of unclear etiology. Chest tube placed and removed 6/26. Cytology negative for malignant cells.      Denies any fever/chills, vision changes/headaches, chest pain or palpitations, shortness of breath or cough, abdominal pain, N/V/D, dysuria or leg swelling.      He does not smoke or drink alcohol .   Significant Events: Admitted 02/11/2024 for generalized weakness   Admission Labs: WBC 6.9, HgB 12.3, plt 124 Na 138, K 4.2, CO2 of 26, BUN 25, Scr 1.41 UA glu >500, neg nitrite, Neg LE BNP 313 TSH 35.78 B12 664  Admission Imaging Studies: CXR Stable chronic changes. 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  recommend consultation of a neuro interventionalist. 2. Fetal origins of both posterior cerebral arteries. 3. Calcific plaque within the carotid bulbs and origins of the internal carotid arteries bilaterally, with less than 30% luminal stenosis. 4. Mild to moderate stenosis of the left posterior cerebral artery and the left superior cerebellar artery  Significant Labs:   Significant Imaging Studies:   Antibiotic Therapy: Anti-infectives (From admission, onward)    None       Procedures:   Consultants: Vascular surgery Neurology Neuro-Interventional Radiology   Hospital Course by Problem: * Weakness generalized Prior to 02-13-2024. 83 year old presenting to ED with vague complaints of weakness, difficulty ambulating and feeling off balance and  some electrical shocks going down his arm who lives alone and is independent with all ADLs -obs to tele at [facility]  -concern for possible CVA with weakness/difficulty ambulating and some ataxia noted by EDP  -neurology consulted by EDP, Dr. Matthews.  -He is declining MRI at this time. CTA  head/neck with no occlusion, but noted cerebral aneurysm and mild to moderate stenosis of the left posterior cerebral artery and the left superior cerebellar artery. Neuro IR consulted.  -TSH, B12, magnesium   -TSH >30, was normal in May 2025. Likely not taking his medication. Free T4 pending  -PT/OT  02-13-2024 pt refused MRI. Home OT/PT recommended. Pt and family refusing home PT/OT. Stable for DC to home.  Discussed with pt's dtr [name] via phone. Discussed that pt is declining. She affirms that pt would not want home PT/OT referral. I offered to consult outpatient hospice. She does not want hospice referral right now. She will talk to pt and call hospice on her own. She states she is familiar with hospice as pt's wife was in hospice care 4 years ago before her death. Discussed discharge today. Dtr is agreeable to taking him home. Dtr is aware that pt will need portable O2 for home use. Pt refuses to go to SNF.   Cerebral aneurysm Prior to 02-13-2024. 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 See their consult note.  S/P femoral-tibial bypass Prior to 02-13-2024. 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 Per vascular note he has not been symptomatic. Not weaker on left side.   New finding of absent DP doppler per EDP  EDP discussed with vascular on call and they plan on seeing him.   02-13-2024 vascular surgery consult noted. He has no worrisome findings on history or physical exam to suggest vascular catastrophe. Doubt this is the source of his weakness.   Hypothyroidism Prior to 02-13-2024. TSH wnl in May of 2025 Repeat TSH ABNORMAL.  ? If not taking his medication???  Continue current synthroid  at 125mcg daily   02-13-2024 not sure what to make of TSH 35.78 with a normal FT4 of 0.65. he will need repeat TSH in office in 4 weeks. And maybe outpatient endocrinology consult if TSH remains  abnormal. Leave on synthroid  125 mcg daily.  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  Interrogation order placed   Anemia due to chronic kidney disease Baseline hgb around 12, stable   Idiopathic thrombocytopenic purpura (ITP) (HCC) Platelets stable SCDs VTE ppx Continue daily prednisone  10 mg  Chronic respiratory failure with hypoxia (HCC) Stable on 3-4L oxygen  PRN   PAF (paroxysmal atrial fibrillation) (HCC) No longer on AC, on ASA daily  Continue ASA and coreg    COPD (chronic obstructive pulmonary disease) (HCC) Stable, continue home nebs/meds -yupelri , brovana  albuterol  PRN   Diabetes mellitus with complication, with long-term current use of insulin  (HCC) Prior to 02-13-2024. A1C in May of 2025 of 8.6  Continue long acting insulin  SSI QAC/HS  Continue jardiance    02-13-2024 continue home insulins.  OSA on CPAP Continue cpap nightly   Essential hypertension Prior to 02-13-2024. Elevated, has not had medication today Continue losartan  100mg  daily, coreg  3.125mg  BID and takes lasix  20mg  daily PRN  02-13-2024  BP is stable now. Continue home meds.  DNR (do not resuscitate)/DNI(Do Not intubate) Verified DNR/DNI with pt's dtr [name].      Discharge Diagnoses:  Principal Problem:   Weakness generalized Active Problems:   Cerebral aneurysm   Hypothyroidism   S/P femoral-tibial bypass   Essential hypertension   OSA on CPAP   Diabetes mellitus with complication, with long-term current use of insulin  (HCC)   COPD (chronic obstructive pulmonary disease) (HCC)   PAF (paroxysmal atrial fibrillation) (HCC)   Chronic respiratory failure with hypoxia (HCC)   Idiopathic thrombocytopenic purpura (ITP) (HCC)   Anemia due to chronic kidney  disease   Pacemaker   Coronary artery disease involving coronary bypass graft of native heart with angina pectoris (HCC)   CKD stage 3a, GFR 45-59 ml/min (HCC)   DNR (do not resuscitate)/DNI(Do Not intubate)   Discharge Instructions  Discharge Instructions     Call MD for:  difficulty breathing, headache or visual disturbances   Complete by: As directed    Call MD for:  extreme fatigue   Complete by: As directed    Call MD for:  hives   Complete by: As directed    Call MD for:  persistant dizziness or light-headedness   Complete by: As directed    Call MD for:  persistant nausea and vomiting   Complete by: As directed    Call MD for:  redness, tenderness, or signs of infection (pain, swelling, redness, odor or green/yellow discharge around incision site)   Complete by: As directed    Call MD for:  severe uncontrolled pain   Complete by: As directed    Call MD for:  temperature >100.4   Complete by: As directed    Diet - low sodium heart healthy   Complete by: As directed    Diet Carb Modified   Complete by: As directed    Discharge instructions   Complete by: As directed    1. Follow up with your primary care provider in 1-2 weeks following discharge from hospital. And to manage your Lasix  dose.   Increase activity slowly   Complete by: As directed       Allergies as of 02/13/2024   No Known Allergies      Medication List     TAKE these medications    acetaminophen  500 MG tablet Commonly known as: TYLENOL  Take 1,000 mg by mouth every 6 (six) hours as needed for mild pain.   albuterol  (2.5 MG/3ML) 0.083% nebulizer solution Commonly known as: PROVENTIL  Take 3 mLs (2.5 mg total) by nebulization every 6 (six) hours as needed for wheezing or shortness of breath. What changed: Another medication with the same name was changed. Make sure you understand how and when to take each.   albuterol  108 (90 Base) MCG/ACT inhaler Commonly known as: VENTOLIN  HFA Inhale 1-2  puffs into the lungs every 6 (six) hours as needed for wheezing or shortness of breath. What changed: how much to take   arformoterol  15 MCG/2ML Nebu Commonly known as: BROVANA  Take 2 mLs (15 mcg total) by nebulization 2 (two) times daily.   aspirin  81 MG chewable tablet Chew 81 mg by mouth daily.   atorvastatin  40 MG tablet Commonly known as: Lipitor  Take 1 tablet (40 mg total) by mouth daily.   carvedilol  3.125 MG tablet Commonly known as: COREG  TAKE 1 TABLET(3.125 MG) BY MOUTH TWICE DAILY WITH A MEAL   cyanocobalamin  500 MCG tablet Commonly known as: VITAMIN B12 Take 500  mcg by mouth daily.   empagliflozin  10 MG Tabs tablet Commonly known as: Jardiance  Take 1 tablet (10 mg total) by mouth daily before breakfast.   ferrous sulfate  324 (65 Fe) MG Tbec Take 324 mg by mouth daily with breakfast.   fluticasone  50 MCG/ACT nasal spray Commonly known as: FLONASE  Place 1 spray into both nostrils daily as needed for allergies.   furosemide  20 MG tablet Commonly known as: Lasix  Take 1 tablet (20 mg total) by mouth daily. What changed:  when to take this reasons to take this   guaifenesin  400 MG Tabs tablet Commonly known as: HUMIBID E Take 400 mg by mouth in the morning and at bedtime.   insulin  aspart 100 UNIT/ML injection Commonly known as: novoLOG  Inject 6-12 Units into the skin daily as needed for high blood sugar (INCREASE AS NEEDED/AS DIRECTED).   insulin  glargine 100 UNIT/ML injection Commonly known as: LANTUS  Inject 26 Units into the skin 2 (two) times daily.   losartan  100 MG tablet Commonly known as: COZAAR  Take 1 tablet (100 mg total) by mouth daily.   LUBRICATING EYE DROPS OP Place 1 drop into both eyes 4 (four) times daily as needed (dry eyes).   MEGA MULTIVITAMIN FOR MEN PO Take 1 tablet by mouth daily with breakfast.   metformin  500 MG (OSM) 24 hr tablet Commonly known as: FORTAMET  Take 500 mg by mouth daily with breakfast.   ondansetron  4 MG  disintegrating tablet Commonly known as: ZOFRAN -ODT Take 1 tablet (4 mg total) by mouth every 6 (six) hours as needed for nausea or vomiting.   OXYGEN  Inhale 2-4 L/min into the lungs continuous.   OZEMPIC  (1 MG/DOSE) [place] Inject 1 mg into the skin every Friday.   predniSONE  10 MG tablet Commonly known as: DELTASONE  Take 1 tablet (10 mg total) by mouth daily with breakfast.   pregabalin  100 MG capsule Commonly known as: LYRICA  Take 100 mg by mouth at bedtime.   Synthroid  125 MCG tablet Generic drug: levothyroxine  Take 125 mcg by mouth daily before breakfast.   tamsulosin  0.4 MG Caps capsule Commonly known as: FLOMAX  Take 1 capsule (0.4 mg total) by mouth at bedtime.   traZODone  50 MG tablet Commonly known as: DESYREL  Take 50 mg by mouth at bedtime.   Vitamin D3 50 MCG (2000 UT) capsule Take 2,000 Units by mouth daily.   Yupelri  175 MCG/3ML nebulizer solution Generic drug: revefenacin  Take 3 mLs (175 mcg total) by nebulization daily.        No Known Allergies  Discharge Exam: Vitals:   02/13/24 0325 02/13/24 0723  BP: (!) 144/83 (!) 153/72  Pulse: 69 65  Resp: 18 (!) 22  Temp: 98.1 F (36.7 C) 98.1 F (36.7 C)  SpO2: 98% 97%    Physical Exam Vitals and nursing note reviewed.  Constitutional:      Comments: Chronically ill appearing  HENT:     Head: Normocephalic and atraumatic.  Eyes:     General: No scleral icterus. Cardiovascular:     Rate and Rhythm: Normal rate and regular rhythm.  Pulmonary:     Effort: Pulmonary effort is normal. No respiratory distress.     Breath sounds: Normal breath sounds. No wheezing.  Abdominal:     General: Bowel sounds are normal.     Palpations: Abdomen is soft.  Skin:    General: Skin is warm and dry.     Capillary Refill: Capillary refill takes less than 2 seconds.  Neurological:  Mental Status: He is alert and oriented to person, place, and time.     The results of significant diagnostics from this  hospitalization (including imaging, microbiology, ancillary and laboratory) are listed below for reference.    Labs: BNP (last 3 results) Recent Labs    12/03/23 1607 01/19/24 1000 02/11/24 1324  BNP 68.4 285.1* 313.7*   Basic Metabolic Panel: Recent Labs  Lab 02/11/24 0847 02/11/24 1744 02/12/24 0257  NA 138  --  139  K 4.2  --  4.5  CL 101  --  99  CO2 26  --  28  GLUCOSE 160*  --  148*  BUN 25*  --  20  CREATININE 1.41*  --  1.40*  CALCIUM  9.2  --  9.4  MG  --  1.8  --    Liver Function Tests: Recent Labs  Lab 02/11/24 0847  AST 22  ALT 17  ALKPHOS 63  BILITOT 0.9  PROT 7.4  ALBUMIN  3.7   CBC: Recent Labs  Lab 02/11/24 0847 02/12/24 0257  WBC 6.9 8.6  HGB 12.3* 14.6  HCT 41.1 46.3  MCV 89.0 86.5  PLT 124* 134*   BNP: Recent Labs  Lab 02/11/24 1324  BNP 313.7*   CBG: Recent Labs  Lab 02/12/24 0757 02/12/24 1234 02/12/24 1634 02/12/24 2104 02/13/24 0637  GLUCAP 154* 191* 181* 194* 185*   Thyroid  function studies Recent Labs    02/11/24 1744 02/12/24 0257  TSH 35.783*  --   FREET4  --  0.65   Anemia work up Recent Labs    02/11/24 1744  VITAMINB12 664   Urinalysis    Component Value Date/Time   COLORURINE YELLOW 02/11/2024 0953   APPEARANCEUR CLEAR 02/11/2024 0953   LABSPEC 1.010 02/11/2024 0953   PHURINE 6.0 02/11/2024 0953   GLUCOSEU >=500 (A) 02/11/2024 0953   GLUCOSEU 100 (A) 10/02/2016 1045   HGBUR NEGATIVE 02/11/2024 0953   BILIRUBINUR NEGATIVE 02/11/2024 0953   BILIRUBINUR negative 05/27/2018 1205   KETONESUR NEGATIVE 02/11/2024 0953   PROTEINUR 30 (A) 02/11/2024 0953   UROBILINOGEN 0.2 05/27/2018 1205   UROBILINOGEN 0.2 10/02/2016 1045   NITRITE NEGATIVE 02/11/2024 0953   LEUKOCYTESUR NEGATIVE 02/11/2024 0953   Sepsis Labs Recent Labs  Lab 02/11/24 0847 02/12/24 0257  WBC 6.9 8.6    Procedures/Studies: CT ANGIO HEAD NECK W WO CM Addendum Date: 02/11/2024 ADDENDUM REPORT: 02/11/2024 17:04 ADDENDUM:  These results were called by telephone at the time of interpretation on 02/11/2024 at 5:03 pm to provider Dr. Gala, who verbally acknowledged these results. Electronically Signed   By: Evalene Coho M.D.   On: 02/11/2024 17:04   Result Date: 02/11/2024 CLINICAL DATA:  Neuro deficit, acute, stroke suspected EXAM: CT ANGIOGRAPHY HEAD AND NECK WITH AND WITHOUT CONTRAST TECHNIQUE: Multidetector CT imaging of the head and neck was performed using the standard protocol during bolus administration of intravenous contrast. Multiplanar CT image reconstructions and MIPs were obtained to evaluate the vascular anatomy. Carotid stenosis measurements (when applicable) are obtained utilizing NASCET criteria, using the distal internal carotid diameter as the denominator. RADIATION DOSE REDUCTION: This exam was performed according to the departmental dose-optimization program which includes automated exposure control, adjustment of the mA and/or kV according to patient size and/or use of iterative reconstruction technique. CONTRAST:  75mL OMNIPAQUE  IOHEXOL  350 MG/ML SOLN COMPARISON:  MRI of the head dated January 21, 2018. FINDINGS: CT HEAD FINDINGS Brain: Age-related cerebral volume loss and mild periventricular and deep cerebral white matter disease.  No evidence of hemorrhage, mass, cortical infarct or hydrocephalus. Vascular: Mild calcific atheromatous disease. Skull: Intact. Sinuses/Orbits: No acute process. Other: None. Review of the MIP images confirms the above findings CTA NECK FINDINGS Aortic arch: Mild calcific atheromatous disease. Right carotid system: There is mild calcific and noncalcific plaque present within the distal aspect of the common carotid artery, which results in less than 20% luminal stenosis. There is mild atheromatous disease also present proximally within the internal carotid artery, with less than 10% luminal stenosis. The internal carotid artery is mildly tortuous, but otherwise normal in caliber.  Left carotid system: There is calcific and noncalcific plaque within the left carotid bulb and origin of the internal carotid artery. There is mild stenosis, less than 30%. The remainder of the cervical segment of the internal carotid artery is mildly tortuous, but normal in caliber. Vertebral arteries: The right vertebral artery is dominant. There is mild calcific plaque within the V3 segment. There is no significant stenosis. The left vertebral artery is relatively hypoplastic. There is moderate stenosis at its origin, approximately 50%. It is otherwise normal in caliber, although the V4 segment is diminutive. Skeleton: Mild dextroscoliosis of the cervical spine. Other neck: Negative. Upper chest: Emphysema. There is streaky left apical parenchymal scarring. There are calcified right apical pleural plaques and there is left apical pleural thickening. Review of the MIP images confirms the above findings CTA HEAD FINDINGS Anterior circulation: There is a 4 x 3 x 3 mm aneurysm arising superior laterally from the A2 segment of the left cerebral artery, which is seen on image 255 of series 14. The anterior cerebral arteries are otherwise normal in caliber. There is mild to moderate calcific atheromatous disease within the cranial and cavernous segments of the internal carotid arteries with no significant luminal stenosis. The middle cerebral arteries and their proximal branches are normal in caliber with no evidence of aneurysm, large vessel occlusion or flow-limiting stenosis. Posterior circulation: There is fetal origin of the posterior cerebral arteries bilaterally. There is mild-to-moderate diffuse stenosis of the left posterior cerebral artery. There is mild diffuse irregular stenosis of the right posterior cerebral artery. The basilar artery demonstrates mild stenosis and terminates in the superior cerebellar arteries bilaterally. The left superior cerebellar artery demonstrates diffuse irregular moderate  stenosis. The left V4 segment is hypoplastic. Venous sinuses: Patent. Anatomic variants: Fetal origins of both posterior cerebral arteries. Review of the MIP images confirms the above findings IMPRESSION: 1. 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 Recommend consultation of a neuro interventionalist. 2. Fetal origins of both posterior cerebral arteries. 3. Calcific plaque within the carotid bulbs and origins of the internal carotid arteries bilaterally, with less than 30% luminal stenosis. 4. Mild to moderate stenosis of the left posterior cerebral artery and the left superior cerebellar artery. Electronically Signed: By: Evalene Coho M.D. On: 02/11/2024 16:55   DG Chest Portable 1 View Result Date: 02/11/2024 CLINICAL DATA:  Generalized weakness EXAM: PORTABLE CHEST 1 VIEW COMPARISON:  01/22/2024 FINDINGS: Left pacer remains in place, unchanged. Heart mediastinal contours within normal limits. Calcified pleural plaques noted on the right. Areas of scarring throughout the right lung and in the left lung base. No definite acute process or change from prior study. IMPRESSION: Stable chronic changes.  No definite acute process. Electronically Signed   By: Franky Crease M.D.   On: 02/11/2024 14:05   DG CHEST PORT 1 VIEW Result Date: 01/22/2024 CLINICAL  DATA:  Left pleural effusion EXAM: PORTABLE CHEST 1 VIEW COMPARISON:  Chest radiograph dated 01/21/2024 FINDINGS: Lines/tubes: Left chest wall pacemaker leads project over the right atrium and terminates below the field of view. Lungs: Well inflated lungs. Increased patchy left basilar opacities. Pleura: Unchanged blunting of bilateral costophrenic angles. Unchanged left apicolateral pleural thickening and right pleural calcification. Heart/mediastinum: The heart size and mediastinal contours are within normal limits. Bones: No acute osseous abnormality.  Unchanged left rib fractures. IMPRESSION: 1. Increased  patchy left basilar opacities, which may represent atelectasis, aspiration, or pneumonia. 2. Unchanged blunting of bilateral costophrenic angles, which may represent pleural thickening/small pleural effusions. 3. 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 Electronically Signed   By: Limin  Xu M.D.   On: 01/22/2024 15:17   DG Chest Port 1 View Result Date: 01/21/2024 CLINICAL DATA:  Recurrent left pleural effusion EXAM: PORTABLE CHEST 1 VIEW COMPARISON:  01/20/2024 FINDINGS: Stable right-sided volume loss and right basilar and left apical parenchymal scarring. Stable right upper lung zone pleural calcification. Tiny left pleural effusion. Stable biapical pleural thickening. No pneumothorax. 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 Cardiac size within normal limits IMPRESSION: 1. Tiny left pleural effusion. 2. Stable right-sided volume loss and right basilar and left apical parenchymal scarring. Electronically Signed   By: Dorethia Molt M.D.   On: 01/21/2024 19:42   DG Chest Port 1 View Result Date: 01/20/2024 CLINICAL DATA:  Evaluate pleural effusion. EXAM: PORTABLE CHEST 1 VIEW COMPARISON:  01/19/2024 FINDINGS: Left chest wall pacer device noted with leads in the right atrial appendage and right ventricle. A pigtail thoracostomy tube is noted overlying the left lower lobe. Small residual pleural effusion noted with blunting of the left costophrenic angle. No significant pneumothorax noted. Unchanged pleural calcifications. Advanced changes of COPD/emphysema. Chronic pleuroparenchymal scarring with volume loss in the right lower lung. Right pleural calcifications again seen. Multiple remote left rib fractures. IMPRESSION: 1. Unchanged position of left basilar pigtail thoracostomy tube. Small residual left pleural effusion. 2. No significant pneumothorax. 3. Advanced changes of COPD/emphysema. 4.  Chronic pleuroparenchymal scarring with volume loss in the right lower lung. Electronically Signed   By: Waddell Calk M.D.   On: 01/20/2024 09:03   DG Chest Portable 1 View Result Date: 01/19/2024 CLINICAL DATA:  Post chest tube EXAM: PORTABLE CHEST 1 VIEW COMPARISON:  01/19/2024 FINDINGS: Interval placement of left basilar chest tube with evacuation of the left pleural effusion. No visible significant effusion currently. No pneumothorax. Calcified pleural plaques again noted on the right. Heart and mediastinal contours are stable. Left Port-A-Cath remains in place, unchanged. Bibasilar airspace opacities, favor atelectasis. IMPRESSION: Decreasing left effusion following left basilar chest tube placement. No pneumothorax. Bibasilar opacities, favor atelectasis. Electronically Signed   By: Franky Crease M.D.   On: 01/19/2024 17:34   CT Chest W Contrast Result Date: 01/19/2024 CLINICAL DATA:  Pneumonia suspected.  * Tracking Code: BO * EXAM: CT CHEST WITH CONTRAST TECHNIQUE: Multidetector CT imaging of the chest was performed during intravenous contrast administration. RADIATION DOSE REDUCTION: This exam was performed according to the departmental dose-optimization program which includes automated exposure control, adjustment of the mA and/or kV according to patient size and/or use of iterative reconstruction technique. CONTRAST:  75mL OMNIPAQUE  IOHEXOL  300 MG/ML  SOLN COMPARISON:  Plain film of earlier today. Most recent chest CT 12/03/2023. FINDINGS: Cardiovascular: Pacer. Aneurysm off the inferior transverse aorta is similar at 3.9 cm  on 15/2. No surrounding hemorrhage. Tortuous thoracic aorta. Normal heart size, without pericardial effusion. Three vessel coronary artery calcification. No central pulmonary embolism, on this non-dedicated study. Pulmonary artery enlargement, outflow tract 3.8 cm. Mediastinum/Nodes: No mediastinal or hilar adenopathy. Lungs/Pleura: The left apical heterogeneous soft tissue  density has been drained in the interval and most likely hematoma. Right-sided pleural calcified plaques. Irregular left-sided complex left pleural thickening, especially superiorly. Increased moderate volume left-sided pleural fluid inferiorly. Left-sided subpleural nodularity/adenopathy in the juxta cardiac space including at 1.9 cm on 47/2, 1.5 cm on the prior. No right-sided pleural fluid. Advanced centrilobular emphysema. Improved left apical aeration with residual scarring and volume loss. Worsened left lower lobe collapse/consolidation. Upper Abdomen: Numerous tiny gallstones. Normal imaged portions of the spleen, stomach, pancreas, adrenal glands. Upper pole left-sided caliectasis on 71/2. Aortic dilatation at the level of the left renal artery origin at 3.8 cm is similar. Musculoskeletal: Old left rib fractures. Minimally displaced posterior seventh left rib fracture. Mild compression deformity at T12 with minimal ventral canal encroachment, similar. IMPRESSION: 1. 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 Subpleural/left cardiophrenic angle bulky adenopathy, progressive. 2. Increased left pleural fluid with left lower lobe collapse/consolidation. Atelectasis and/or infection. 3. No convincing evidence of primary malignancy within the lungs. 4. Possible upper pole left-sided caliectasis. Correlate with abdominal symptoms and possibly dedicated imaging. 5. 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 6. Right-sided pleural calcified plaques, likely result of remote empyema or hemothorax. 7. Pulmonary artery enlargement suggests pulmonary arterial hypertension. 8. Incidental findings, including: Aortic atherosclerosis (ICD10-I70.0), coronary artery atherosclerosis and emphysema (ICD10-J43.9). Pulmonary artery enlargement suggests pulmonary arterial hypertension. Cholelithiasis. Electronically Signed   By: Rockey Kilts M.D.   On: 01/19/2024 13:44   DG Chest 2 View Result Date: 01/19/2024 CLINICAL DATA:  Shortness of breath, weight gain. EXAM: CHEST - 2 VIEW COMPARISON:  January 07, 2024. FINDINGS: Stable cardiomediastinal silhouette. Left-sided pacemaker is unchanged. Extensive right pleural calcifications are noted suggesting asbestos exposure. Small bilateral pleural effusions are noted. Bilateral lung opacities are noted concerning for infiltrates or atelectasis. Bony thorax is unremarkable. IMPRESSION: Stable right-sided pleural plaques. Mildly increased bibasilar lung opacities are noted concerning for infiltrates or atelectasis. Small pleural effusions. Electronically Signed   By: Lynwood Landy Raddle M.D.   On: 01/19/2024 09:56    Time coordinating discharge: 55 mins  SIGNED:  Camellia Door, DO [facility] 02/13/24, 11:28 AM

## 2024-02-13 NOTE — Progress Notes (Signed)
 Physical Therapy Treatment Patient Details Name: [name]. MRN: [id number] DOB: 06-19-1941 Today's Date: 02/13/2024   History of Present Illness 83 y.o. male presents to [facility] 02/11/24 with generalized weakness and diarrhea. CTA showed cerebral aneurysm, mild/mod stenosis of L PCA and L SCA, declining MRI. 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 Pt appears to have declined since initial evaluation yesterday. +2 Mod A to stand and steady. Pt noted to be very shaky and unable to take steps. Pt able to perform standing marches however had 1 LOB requiring +2 Mod A to correct. Ambulation deferred for safety. Pt reported difficulty breathing however unable to obtain accurate pleth initially. After pt was given hot pack to warm hands, able to obtain accurate pleth reading of 95% on 5L. Pt placed on continuous pulse ox. DC recs updated to continued inpatient follow up therapy, <3 hours/day. PT will continue to follow.      If plan is discharge home, recommend the following: A lot of help with walking and/or transfers;A lot of help with bathing/dressing/bathroom;Assistance with cooking/housework;Assist for transportation;Help with stairs or ramp for entrance   Can travel by private vehicle     No  Equipment Recommendations  None recommended by PT    Recommendations for Other Services       Precautions / Restrictions Precautions Precautions: Fall Recall of Precautions/Restrictions: Intact Restrictions Weight Bearing Restrictions Per Provider Order: No     Mobility  Bed Mobility               General bed mobility comments: Up in recliner    Transfers Overall transfer level: Needs assistance Equipment used: Rolling walker (2 wheels) Transfers: Sit to/from Stand Sit to Stand: +2 physical assistance,  Mod assist           General transfer comment: +2 Mod A to stand and steady. Pt noted to be very shaky and unable to take steps. Pt able to perform standing marches however had 1 LOB requiring +2 Mod A to correct. Ambulation deferred for safety.    Ambulation/Gait               General Gait Details: Deferred for safety.   Stairs             Wheelchair Mobility     Tilt Bed    Modified Rankin (Stroke Patients Only)       Balance Overall balance assessment: Needs assistance Sitting-balance support: Feet supported, No upper extremity supported Sitting balance-Leahy Scale: Good     Standing balance support: Bilateral upper extremity supported, During functional activity, Reliant on assistive device for balance Standing balance-Leahy Scale: Poor Standing balance comment: RW support                            Communication Communication Communication: No apparent difficulties  Cognition Arousal: Alert Behavior During Therapy: Anxious   PT - Cognitive impairments: No apparent impairments                         Following commands: Intact      Cueing Cueing Techniques: Verbal cues, Tactile cues  Exercises      General Comments General comments (skin integrity, edema, etc.): Pt reported difficulty breathing  however unable to obtain accurate pleth initially. After pt was given hot pack to warm hands, able to obtain accurate pleth reading of 95% on 5L. Pt placed on continuous pulse ox.      Pertinent Vitals/Pain Pain Assessment Pain Assessment: No/denies pain    Home Living                          Prior Function            PT Goals (current goals can now be found in the care plan section) Progress towards PT goals: Progressing toward goals    Frequency    Min 2X/week      PT Plan      Co-evaluation              AM-PAC PT 6 Clicks Mobility   Outcome Measure  Help needed turning from your back  to your side while in a flat bed without using bedrails?: A Little Help needed moving from lying on your back to sitting on the side of a flat bed without using bedrails?: A Little Help needed moving to and from a bed to a chair (including a wheelchair)?: A Lot Help needed standing up from a chair using your arms (e.g., wheelchair or bedside chair)?: A Lot Help needed to walk in hospital room?: Total Help needed climbing 3-5 steps with a railing? : Total 6 Click Score: 12    End of Session Equipment Utilized During Treatment: Gait belt;Oxygen  Activity Tolerance: Patient limited by fatigue Patient left: in chair;with call bell/phone within reach;with chair alarm set;with nursing/sitter in room Nurse Communication: Mobility status PT Visit Diagnosis: Unsteadiness on feet (R26.81);Other abnormalities of gait and mobility (R26.89);Muscle weakness (generalized) (M62.81)     Time: 8799-8783 PT Time Calculation (min) (ACUTE ONLY): 16 min  Charges:    $Therapeutic Activity: 8-22 mins PT General Charges $$ ACUTE PT VISIT: 1 Visit                     Sueellen NOVAK, PT, DPT Acute Rehab Services [phone]    Oliviarose Punch 02/13/2024, 12:32 PM

## 2024-02-13 NOTE — NC FL2 (Signed)
 [place]  MEDICAID FL2 LEVEL OF CARE FORM     IDENTIFICATION  Patient Name: [name]. Birthdate: 01/28/1941 Sex: male Admission Date (Current Location): 02/11/2024  [facility] and [place] Number:  Producer, television/film/video and Address:  [facility] [place], [address] [place], [place] [address]      Provider Number: [id number]  Attending Physician Name and Address:  Laurence Locus, DO  Relative Name and Phone Number:       Current Level of Care: Hospital Recommended Level of Care: Skilled Nursing Facility Prior Approval Number:    Date Approved/Denied:   PASRR Number: [id number]  Discharge Plan: SNF    Current Diagnoses: Patient Active Problem List   Diagnosis Date Noted   DNR (do not resuscitate)/DNI(Do Not intubate) 02/13/2024   Weakness generalized 02/11/2024   Cerebral aneurysm 02/11/2024   CKD stage 3a, GFR 45-59 ml/min (HCC) 02/11/2024   Malnutrition of moderate degree 12/06/2023   Rib fractures 10/27/2022   Allergic rhinitis 10/27/2022   Coronary artery disease involving coronary bypass graft of native heart with angina pectoris (HCC) 08/15/2022   S/P femoral-tibial bypass 03/04/2022   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   Oxygen  dependent 03/05/2021   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   Obesity 12/11/2020   Osteoarthritis 12/11/2020   Other long term (current) drug therapy 12/11/2020   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   Claudication (HCC) 07/03/2020   LBBB (left bundle branch block) 10/04/2019   Pacemaker 10/04/2019   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   Lung nodules 03/25/2019   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   BENIGN PROSTATIC HYPERTROPHY 12/2017   Chronic respiratory failure with hypoxia (HCC) 12/01/2017   Lung mass 12/01/2017   PAF (paroxysmal atrial fibrillation) (HCC)    Ischemic cardiomyopathy 09/02/2017   Chronic  systolic CHF (congestive heart failure) (HCC) 09/02/2017   Aortic aneurysm (HCC) 09/02/2017   Diabetes mellitus with complication, with long-term current use of insulin  (HCC) 09/01/2017   Anxiety and depression 09/01/2017   COPD (chronic obstructive pulmonary disease) (HCC) 09/01/2017   CORONARY ARTERY DISEASE 08/28/2016   Myogenic ptosis of right eyelid 07/11/2016   Open angle with borderline findings, low risk, bilateral 07/11/2016   Bilateral dry eyes 04/14/2014   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/14/2014   Hyperlipidemia associated with type 2 diabetes mellitus (HCC) 01/05/2014   High risk medication use 10/07/2013   Pseudophakia of both eyes 10/07/2013   Drusen of left macula 12/01/2012   Scleritis of both eyes 12/01/2012   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   PTSD (post-traumatic stress disorder) 03/10/2012   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   Rheumatoid arthritis (HCC) 09/09/2011   OSA on CPAP 06/10/2011   GAIT DISTURBANCE 12/10/2009   Coronary atherosclerosis 11/23/2009   Hypothyroidism 07/30/2009   Essential hypertension 07/30/2009    Orientation RESPIRATION BLADDER Height & Weight     Self, Time, Situation, Place  O2, Other (Comment) (3-5L [place]; Bipap at HS) Continent Weight:   Height:     BEHAVIORAL SYMPTOMS/MOOD NEUROLOGICAL BOWEL NUTRITION STATUS      Continent    AMBULATORY STATUS COMMUNICATION OF NEEDS Skin   Limited Assist                           Personal Care Assistance Level of Assistance  Bathing, Feeding, Dressing Bathing Assistance: Limited assistance Feeding assistance: Limited assistance Dressing Assistance: Limited  assistance     Functional Limitations Info  Sight, Hearing, Speech Sight Info: Adequate Hearing Info: Adequate Speech Info: Adequate    SPECIAL CARE FACTORS FREQUENCY  PT (By licensed PT), OT (By licensed OT)                    Contractures Contractures Info: Not present    Additional  Factors Info  Code Status Code Status Info: FULL CODE             Current Medications (02/13/2024):  This is the current hospital active medication list Current Facility-Administered Medications  Medication Dose Route Frequency Provider Last Rate Last Admin   acetaminophen  (TYLENOL ) tablet 650 mg  650 mg Oral Q6H PRN Waddell Rake, MD   650 mg at 02/12/24 1158   Or   acetaminophen  (TYLENOL ) suppository 650 mg  650 mg Rectal Q6H PRN Waddell Rake, MD       albuterol  (PROVENTIL ) (2.5 MG/3ML) 0.083% nebulizer solution 2.5 mg  2.5 mg Nebulization Q6H PRN Waddell Rake, MD   2.5 mg at 02/12/24 1304   ALPRAZolam  (XANAX ) tablet 0.25 mg  0.25 mg Oral TID PRN Rashid, Farhan, MD   0.25 mg at 02/12/24 1334   arformoterol  (BROVANA ) nebulizer solution 15 mcg  15 mcg Nebulization BID Waddell Rake, MD   15 mcg at 02/12/24 0829   artificial tears ophthalmic solution 1 drop  1 drop Both Eyes PRN Zelphia Randall PEDLAR, [facility]       aspirin  chewable tablet 81 mg  81 mg Oral Daily Waddell Rake, MD   81 mg at 02/13/24 1027   atorvastatin  (LIPITOR ) tablet 40 mg  40 mg Oral Daily Waddell Rake, MD   40 mg at 02/13/24 1027   ferrous sulfate  tablet 325 mg  325 mg Oral Q breakfast Reome, Earle J, RPH   325 mg at 02/13/24 1027   furosemide  (LASIX ) tablet 20 mg  20 mg Oral Daily Rashid, Farhan, MD   20 mg at 02/13/24 1027   guaiFENesin  tablet 400 mg  400 mg Oral BID Waddell Rake, MD   400 mg at 02/13/24 1043   hydrALAZINE  (APRESOLINE ) injection 10 mg  10 mg Intravenous Q6H PRN Sundil, Subrina, MD   10 mg at 02/12/24 1203   insulin  aspart (novoLOG ) injection 0-15 Units  0-15 Units Subcutaneous TID WC Waddell Rake, MD   3 Units at 02/13/24 0800   levothyroxine  (SYNTHROID ) tablet 125 mcg  125 mcg Oral QAC breakfast Waddell Rake, MD   125 mcg at 02/13/24 0601   losartan  (COZAAR ) tablet 100 mg  100 mg Oral Daily Waddell Rake, MD   100 mg at 02/13/24 1027   predniSONE  (DELTASONE ) tablet 10 mg  10 mg Oral Q breakfast  Waddell Rake, MD   10 mg at 02/13/24 1027   pregabalin  (LYRICA ) capsule 100 mg  100 mg Oral QHS Waddell Rake, MD   100 mg at 02/12/24 2205   prochlorperazine  (COMPAZINE ) injection 10 mg  10 mg Intravenous Q6H PRN Rashid, Farhan, MD   10 mg at 02/12/24 0830   revefenacin  (YUPELRI ) nebulizer solution 175 mcg  175 mcg Nebulization Daily Waddell Rake, MD   175 mcg at 02/12/24 9170   tamsulosin  (FLOMAX ) capsule 0.4 mg  0.4 mg Oral QHS Waddell Rake, MD   0.4 mg at 02/12/24 2204   traZODone  (DESYREL ) tablet 50 mg  50 mg Oral QHS Waddell Rake, MD   50 mg at 02/12/24 2205   vitamin B-12 (CYANOCOBALAMIN ) tablet 500  mcg  500 mcg Oral Daily Waddell Rake, MD   500 mcg at 02/13/24 1026     Discharge Medications: Please see discharge summary for a list of discharge medications.  Relevant Imaging Results:  Relevant Lab Results:   Additional Information SS# [id number]  Gwenn Julien [place], [place]

## 2024-02-13 NOTE — TOC Progression Note (Signed)
 Transition of Care (TOC) - Progression Note    Patient Details  Name: [name]. MRN: [id number] Date of Birth: [birth year]  Transition of Care [facility]) CM/SW Contact  Gwenn Frieze [place], [place] Phone Number: 02/13/2024, 12:31 PM  Clinical Narrative:   PT updated rec from [facility] to SNF. Pt agreeable to SNF for STR. Will start SNF search and f/u with offers as available. Pt will require Mylene barrows for SNF.  Frieze Gwenn, MSW, LCSW [phone] (coverage)      Expected Discharge Plan:  (TBD)    Expected Discharge Plan and Services         Expected Discharge Date: 02/13/24                                     Social Determinants of Health (SDOH) Interventions SDOH Screenings   Food Insecurity: No Food Insecurity (02/13/2024)  Housing: Low Risk  (02/13/2024)  Transportation Needs: No Transportation Needs (02/13/2024)  Utilities: Not At Risk (02/13/2024)  Alcohol  Screen: Low Risk  (01/11/2024)  Depression (PHQ2-9): Low Risk  (01/11/2024)  Financial Resource Strain: Low Risk  (01/11/2024)  Physical Activity: Insufficiently Active (01/11/2024)  Social Connections: Socially Isolated (02/13/2024)  Stress: No Stress Concern Present (01/11/2024)  Tobacco Use: Medium Risk (02/11/2024)  Health Literacy: Adequate Health Literacy (01/11/2024)    Readmission Risk Interventions    12/06/2023    3:24 PM  Readmission Risk Prevention Plan  Transportation Screening Complete  PCP or Specialist Appt within 5-7 Days Complete  Home Care Screening Complete  Medication Review (RN CM) Complete

## 2024-02-13 NOTE — Plan of Care (Signed)

## 2024-02-13 NOTE — Progress Notes (Addendum)
 PROGRESS NOTE    [name].  FMW:[id number] DOB: 11/06/1940 DOA: 02/11/2024 PCP: Rollene Almarie LABOR, MD  Subjective: Pt seen and examined. Notes from consultation reviewed. No interventions are needed during this hospitalization.  PT/OT assessments reviewed. Pt is stable to go home.   Hospital Course: HPI:  [name] is a 83 y.o. 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 He had diarrhea about 3 days ago and had one day with about 4 episodes of watery diarrhea. He felt a little bit more weak after this and he increased his water intake. He then thought he over drank and took some extra diuretics. He got up yesterday and he felt a little weak and had more trouble maneuvering his walker which is new for him. He seemed to get weaker as the day progressed. Today he couldn't really walk well and had some strange feelings behind his right ear. He had pain shooting down his left arm that felt like little shocks. He also had a headache. With the weakness he came to ED.    Baseline he is independent with all ADLs. Still cooks his own meals, showers, and drives. His daughter comes once a week and will help or run errands with him if he needs anything. He does all of his own finances.    Admitted 01/19/24 for acute on chronic respiratory failure in setting of severe COPD and recurrent moderate left pleural effusion of unclear etiology. Chest tube placed and removed 6/26. Cytology negative for malignant cells.      Denies any fever/chills, vision changes/headaches, chest pain or palpitations, shortness of breath or cough,  abdominal pain, N/V/D, dysuria or leg swelling.      He does not smoke or drink alcohol .   Significant Events: Admitted 02/11/2024 for generalized weakness   Admission Labs: WBC 6.9, HgB 12.3, plt 124 Na 138, K 4.2, CO2 of 26, BUN 25, Scr 1.41 UA glu >500, neg nitrite, Neg LE BNP 313 TSH 35.78 B12 664  Admission Imaging Studies: CXR Stable chronic changes. 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  recommend consultation of a neuro interventionalist. 2. Fetal origins of both posterior cerebral arteries. 3. Calcific plaque within the carotid bulbs and origins of the internal carotid arteries bilaterally, with less than 30% luminal stenosis. 4. Mild to moderate stenosis of the left posterior cerebral artery and the left superior cerebellar artery  Significant Labs:   Significant Imaging Studies:   Antibiotic Therapy: Anti-infectives (From admission, onward)    None       Procedures:   Consultants: Vascular surgery Neurology Neuro-Interventional Radiology    Assessment and Plan: * Weakness generalized Prior to 02-13-2024. 83 year old presenting to ED with vague complaints of weakness, difficulty ambulating and feeling off balance and some electrical shocks going down his arm who lives alone and is independent with all ADLs -obs to tele at [facility]  -concern for possible CVA with weakness/difficulty ambulating and some ataxia noted by EDP  -neurology consulted by EDP, Dr. Matthews.  -He is declining MRI at this time. CTA  head/neck with no occlusion, but noted cerebral aneurysm and mild to moderate stenosis of the left posterior cerebral artery and the left superior cerebellar artery. Neuro IR consulted.  -TSH, B12, magnesium   -TSH >30, was normal in May 2025. Likely not taking his medication. Free T4 pending  -PT/OT  02-13-2024 pt refused MRI. Home OT/PT recommended. Pt and family  refusing home PT/OT. Stable for DC to home.  Cerebral aneurysm Prior to 02-13-2024. 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 See their consult note.  S/P femoral-tibial bypass Prior to 02-13-2024. 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 Per vascular note he has not been symptomatic. Not weaker on left side.   New finding of absent DP doppler per EDP  EDP discussed with vascular on call and they plan on seeing him.   02-13-2024 vascular surgery consult noted. He has no worrisome findings on history or physical exam to suggest vascular catastrophe. Doubt this is the source of his weakness.   Hypothyroidism Prior to 02-13-2024. TSH wnl in May of 2025 Repeat TSH ABNORMAL.  ? If not taking his medication???  Continue current synthroid  at 125mcg daily   02-13-2024 not sure what to make of TSH 35.78 with a normal FT4 of 0.65. he will need repeat TSH in office in 4 weeks. And maybe outpatient endocrinology consult if TSH remains abnormal. Leave on synthroid  125 mcg daily.  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  Interrogation order placed   Anemia due to chronic kidney disease Baseline hgb around 12, stable   Idiopathic thrombocytopenic purpura (ITP) (HCC) Platelets stable SCDs VTE ppx Continue daily prednisone  10 mg  Chronic respiratory failure with hypoxia (HCC) Stable on 3-4L oxygen  PRN   PAF (paroxysmal atrial fibrillation) (HCC) No longer on AC, on ASA daily  Continue ASA and coreg    COPD (chronic obstructive pulmonary disease) (HCC) Stable, continue home nebs/meds -yupelri , brovana  albuterol  PRN   Diabetes mellitus with complication, with long-term  current use of insulin  (HCC) Prior to 02-13-2024. A1C in May of 2025 of 8.6  Continue long acting insulin  SSI QAC/HS  Continue jardiance    02-13-2024 continue home insulins.  OSA on CPAP Continue cpap nightly   Essential hypertension Prior to 02-13-2024. Elevated, has not had medication today Continue losartan  100mg  daily, coreg  3.125mg  BID and takes lasix  20mg  daily PRN  02-13-2024 BP is stable now. Continue home meds.   DVT prophylaxis: SCDs Start: 02/11/24 1620    Code Status: Limited: Do not attempt resuscitation (DNR) -DNR-LIMITED -Do Not Intubate/DNI  Family Communication: no family at bedside. Discussed with pt's dtr [name] via phone. Discussed that pt is declining. She affirms that pt would not want home PT/OT referral. I offered to consult outpatient hospice. She does not want hospice referral right now. She will talk to pt and call hospice on her own. She states she is familiar with hospice as pt's wife was in hospice care 4 years ago before her death. Discussed discharge today. Dtr is agreeable to taking him home. Dtr is aware that pt will need portable O2 for home use. Pt refuses to go to SNF.  Disposition Plan: return home Reason for continuing need for hospitalization: stable for DC  Objective:  Vitals:   02/12/24 2104 02/12/24 2358 02/13/24 0325 02/13/24 0723  BP: (!) 154/79 (!) 168/67 (!) 144/83 (!) 153/72  Pulse: 62 62 69 65  Resp: 18 18 18  (!) 22  Temp: 97.7 F (36.5 C) 97.6 F (36.4 C) 98.1 F (36.7 C) 98.1 F (36.7 C)  TempSrc: Oral Oral Oral Oral  SpO2: 92% 99% 98% 97%    Intake/Output Summary (Last 24 hours) at 02/13/2024 1025 Last data filed at 02/13/2024 0900 Gross per 24 hour  Intake 480 ml  Output 1125 ml  Net -645 ml   There were no vitals filed for this visit.  Examination:  Physical Exam Vitals and nursing note reviewed.  Constitutional:      Comments: Appears chronically ill  HENT:     Head: Normocephalic and atraumatic.     Nose:  Nose normal.  Eyes:     General: No scleral icterus. Cardiovascular:     Rate and Rhythm: Normal rate and regular rhythm.  Pulmonary:     Effort: Pulmonary effort is normal.     Breath sounds: Normal breath sounds.     Comments: On O2 Abdominal:     General: Abdomen is flat. Bowel sounds are normal.     Palpations: Abdomen is soft.  Musculoskeletal:     Right lower leg: No edema.     Left lower leg: No edema.  Skin:    General: Skin is warm and dry.     Capillary Refill: Capillary refill takes less than 2 seconds.  Neurological:     Mental Status: He is oriented to person, place, and time.     Data Reviewed: I have personally reviewed following labs and imaging studies  CBC: Recent Labs  Lab 02/11/24 0847 02/12/24 0257  WBC 6.9 8.6  HGB 12.3* 14.6  HCT 41.1 46.3  MCV 89.0 86.5  PLT 124* 134*   Basic Metabolic Panel: Recent Labs  Lab 02/11/24 0847 02/11/24 1744 02/12/24 0257  NA 138  --  139  K 4.2  --  4.5  CL 101  --  99  CO2 26  --  28  GLUCOSE 160*  --  148*  BUN 25*  --  20  CREATININE 1.41*  --  1.40*  CALCIUM  9.2  --  9.4  MG  --  1.8  --    GFR: Estimated Creatinine Clearance: 47.3 mL/min (A) (by C-G formula based on SCr of 1.4 mg/dL (H)). Liver Function Tests: Recent Labs  Lab 02/11/24 0847  AST 22  ALT 17  ALKPHOS 63  BILITOT 0.9  PROT 7.4  ALBUMIN  3.7   BNP (last 3 results) Recent Labs    12/03/23 1607 01/19/24 1000 02/11/24 1324  BNP 68.4 285.1* 313.7*   CBG: Recent Labs  Lab 02/12/24 0757 02/12/24 1234 02/12/24 1634 02/12/24 2104 02/13/24 0637  GLUCAP 154* 191* 181* 194* 185*   Thyroid  Function Tests: Recent Labs    02/11/24 1744 02/12/24 0257  TSH 35.783*  --   FREET4  --  0.65   Anemia Panel: Recent Labs    02/11/24 1744  VITAMINB12 664   Radiology Studies: CT ANGIO HEAD NECK W WO CM Addendum Date: 02/11/2024 ADDENDUM REPORT: 02/11/2024 17:04 ADDENDUM: These results were called by telephone at the time of  interpretation on 02/11/2024 at 5:03 pm to provider Dr. Gala, who verbally acknowledged these results. Electronically Signed   By: Evalene Coho M.D.   On: 02/11/2024 17:04   Result Date: 02/11/2024 CLINICAL DATA:  Neuro deficit, acute, stroke suspected EXAM: CT ANGIOGRAPHY HEAD AND NECK WITH AND WITHOUT CONTRAST TECHNIQUE: Multidetector CT imaging of the head and neck was performed using the standard protocol during bolus administration of intravenous contrast. Multiplanar CT image reconstructions and MIPs were obtained to evaluate the vascular anatomy. Carotid stenosis measurements (when applicable) are obtained utilizing NASCET criteria, using the distal internal carotid diameter as the denominator. RADIATION DOSE REDUCTION: This exam was performed according to the departmental dose-optimization program which includes automated exposure control, adjustment of the mA and/or kV according to patient size and/or use of iterative reconstruction technique. CONTRAST:  75mL OMNIPAQUE  IOHEXOL  350 MG/ML SOLN COMPARISON:  MRI of the head dated January 21, 2018. FINDINGS: CT HEAD FINDINGS Brain: Age-related cerebral volume loss and mild periventricular and deep cerebral white matter disease. No evidence of hemorrhage, mass, cortical infarct or hydrocephalus. Vascular: Mild calcific atheromatous disease. Skull: Intact. Sinuses/Orbits: No acute process. Other: None. Review of the MIP images confirms the above findings CTA NECK FINDINGS Aortic arch: Mild calcific atheromatous disease. Right carotid system: There is mild calcific and noncalcific plaque present within the distal aspect of the common carotid artery, which results in less than 20% luminal stenosis. There is mild atheromatous disease also present proximally within the internal carotid artery, with less than 10% luminal stenosis. The internal carotid artery is mildly tortuous, but otherwise normal in caliber. Left carotid system: There is calcific and noncalcific  plaque within the left carotid bulb and origin of the internal carotid artery. There is mild stenosis, less than 30%. The remainder of the cervical segment of the internal carotid artery is mildly tortuous, but normal in caliber. Vertebral arteries: The right vertebral artery is dominant. There is mild calcific plaque within the V3 segment. There is no significant stenosis. The left vertebral artery is relatively hypoplastic. There is moderate stenosis at its origin, approximately 50%. It is otherwise normal in caliber, although the V4 segment is diminutive. Skeleton: Mild dextroscoliosis of the cervical spine. Other neck: Negative. Upper chest: Emphysema. There is streaky left apical parenchymal scarring. There are calcified right apical pleural plaques and there is left apical pleural thickening. Review of the MIP images confirms the above findings CTA HEAD FINDINGS Anterior circulation: There is a 4 x 3 x 3 mm aneurysm arising superior laterally from the A2 segment of the left cerebral artery, which is seen on image 255 of series 14. The anterior cerebral arteries are otherwise normal in caliber. There is mild to moderate calcific atheromatous disease within the cranial and cavernous segments of the internal carotid arteries with no significant luminal stenosis. The middle cerebral arteries and their proximal branches are normal in caliber with no evidence of aneurysm, large vessel occlusion or flow-limiting stenosis. Posterior circulation: There is fetal origin of the posterior cerebral arteries bilaterally. There is mild-to-moderate diffuse stenosis of the left posterior cerebral artery. There is mild diffuse irregular stenosis of the right posterior cerebral artery. The basilar artery demonstrates mild stenosis and terminates in the superior cerebellar arteries bilaterally. The left superior cerebellar artery demonstrates diffuse irregular moderate stenosis. The left V4 segment is hypoplastic. Venous sinuses:  Patent. Anatomic variants: Fetal origins of both posterior cerebral arteries. Review of the MIP images confirms the above findings IMPRESSION: 1. 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 Recommend consultation of a neuro interventionalist. 2. Fetal origins of both posterior cerebral arteries. 3. Calcific plaque within the carotid bulbs and origins  of the internal carotid arteries bilaterally, with less than 30% luminal stenosis. 4. Mild to moderate stenosis of the left posterior cerebral artery and the left superior cerebellar artery. Electronically Signed: By: Evalene Coho M.D. On: 02/11/2024 16:55   DG Chest Portable 1 View Result Date: 02/11/2024 CLINICAL DATA:  Generalized weakness EXAM: PORTABLE CHEST 1 VIEW COMPARISON:  01/22/2024 FINDINGS: Left pacer remains in place, unchanged. Heart mediastinal contours within normal limits. Calcified pleural plaques noted on the right. Areas of scarring throughout the right lung and in the left lung base. No definite acute process or change from prior study. IMPRESSION: Stable chronic changes.  No definite acute process. Electronically Signed   By: Franky Crease M.D.   On: 02/11/2024 14:05    Scheduled Meds:  arformoterol   15 mcg Nebulization BID   aspirin   81 mg Oral Daily   atorvastatin   40 mg Oral Daily   ferrous sulfate   325 mg Oral Q breakfast   furosemide   20 mg Oral Daily   guaiFENesin   400 mg Oral BID   insulin  aspart  0-15 Units Subcutaneous TID WC   levothyroxine   125 mcg Oral QAC breakfast   losartan   100 mg Oral Daily   predniSONE   10 mg Oral Q breakfast   pregabalin   100 mg Oral QHS   revefenacin   175 mcg Nebulization Daily   tamsulosin   0.4 mg Oral QHS   traZODone   50 mg Oral QHS   cyanocobalamin   500 mcg Oral Daily   Continuous Infusions:   LOS: 0 days   Time spent: 55 minutes  Camellia Door, DO  [facility]  02/13/2024, 10:25 AM

## 2024-02-14 DIAGNOSIS — N1831 Chronic kidney disease, stage 3a: Secondary | ICD-10-CM | POA: Diagnosis not present

## 2024-02-14 DIAGNOSIS — I671 Cerebral aneurysm, nonruptured: Secondary | ICD-10-CM | POA: Diagnosis not present

## 2024-02-14 DIAGNOSIS — Z515 Encounter for palliative care: Secondary | ICD-10-CM | POA: Diagnosis not present

## 2024-02-14 DIAGNOSIS — J432 Centrilobular emphysema: Secondary | ICD-10-CM | POA: Diagnosis not present

## 2024-02-14 DIAGNOSIS — I480 Paroxysmal atrial fibrillation: Secondary | ICD-10-CM | POA: Diagnosis not present

## 2024-02-14 DIAGNOSIS — D631 Anemia in chronic kidney disease: Secondary | ICD-10-CM | POA: Diagnosis not present

## 2024-02-14 DIAGNOSIS — J9611 Chronic respiratory failure with hypoxia: Secondary | ICD-10-CM | POA: Diagnosis not present

## 2024-02-14 DIAGNOSIS — E039 Hypothyroidism, unspecified: Secondary | ICD-10-CM | POA: Diagnosis not present

## 2024-02-14 DIAGNOSIS — R531 Weakness: Secondary | ICD-10-CM | POA: Diagnosis not present

## 2024-02-14 LAB — BASIC METABOLIC PANEL WITH GFR
Anion gap: 16 — ABNORMAL HIGH (ref 5–15)
BUN: 26 mg/dL — ABNORMAL HIGH (ref 8–23)
CO2: 27 mmol/L (ref 22–32)
Calcium: 10 mg/dL (ref 8.9–10.3)
Chloride: 95 mmol/L — ABNORMAL LOW (ref 98–111)
Creatinine, Ser: 1.36 mg/dL — ABNORMAL HIGH (ref 0.61–1.24)
GFR, Estimated: 52 mL/min — ABNORMAL LOW (ref >60)
Glucose, Bld: 161 mg/dL — ABNORMAL HIGH (ref 70–99)
Potassium: 4.6 mmol/L (ref 3.5–5.1)
Sodium: 138 mmol/L (ref 135–145)

## 2024-02-14 LAB — GLUCOSE, CAPILLARY
Glucose-Capillary: 156 mg/dL — ABNORMAL HIGH (ref 70–99)
Glucose-Capillary: 190 mg/dL — ABNORMAL HIGH (ref 70–99)
Glucose-Capillary: 211 mg/dL — ABNORMAL HIGH (ref 70–99)
Glucose-Capillary: 195 mg/dL — ABNORMAL HIGH (ref 70–99)

## 2024-02-14 LAB — TSH: TSH: 23.882 u[IU]/mL — ABNORMAL HIGH (ref 0.350–4.500)

## 2024-02-14 LAB — T4, FREE: Free T4: 0.67 ng/dL (ref 0.61–1.12)

## 2024-02-14 MED ORDER — ONDANSETRON HCL 4 MG/2ML IJ SOLN
4.0000 mg | Freq: Four times a day (QID) | INTRAMUSCULAR | Status: DC | PRN
  Administered 2024-02-14: 4 mg via INTRAVENOUS
  Filled 2024-02-14: qty 2

## 2024-02-14 MED ORDER — LORAZEPAM 2 MG/ML IJ SOLN: 0.5000 mg | INTRAMUSCULAR | Status: DC | PRN

## 2024-02-14 MED ORDER — ONDANSETRON 4 MG PO TBDP: 4.0000 mg | ORAL_TABLET | Freq: Four times a day (QID) | ORAL | Status: DC | PRN

## 2024-02-14 NOTE — Plan of Care (Signed)

## 2024-02-14 NOTE — Progress Notes (Signed)
 PROGRESS NOTE    [name].  FMW:[id number] DOB: [birth year] DOA: 02/11/2024 PCP: Rollene Almarie LABOR, MD  Subjective: Pt seen and examined. DC canceled yesterday as pt now wants to go to SNF at discharge. Yesterday AM he had refused to go to SNF.  During this hospitalization he refused to have MRI brian. Now he is agreeable to getting MRI brian.  Pt has PPM so it will need to be performed tomorrow.   Hospital Course: HPI:  [name] is a 83 y.o. 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 He had diarrhea about 3 days ago and had one day with about 4 episodes of watery diarrhea. He felt a little bit more weak after this and he increased his water intake. He then thought he over drank and took some extra diuretics. He got up yesterday and he felt a little weak and had more trouble maneuvering his walker which is new for him. He seemed to get weaker as the day progressed. Today he couldn't really walk well and had some strange feelings behind his right ear. He had pain shooting down his left arm that felt like little shocks. He also had a headache. With the weakness he came to ED.    Baseline he is independent with all ADLs. Still cooks his own meals, showers, and drives. His daughter comes once a week and will help or run errands with him if he needs anything. He does all of his own finances.    Admitted 01/19/24 for acute on chronic respiratory failure in setting of severe COPD and recurrent moderate left pleural effusion of unclear etiology. Chest tube placed and removed 6/26. Cytology negative for malignant cells.       Denies any fever/chills, vision changes/headaches, chest pain or palpitations, shortness of breath or cough, abdominal pain, N/V/D, dysuria or leg swelling.      He does not smoke or drink alcohol .   Significant Events: Admitted 02/11/2024 for generalized weakness   Admission Labs: WBC 6.9, HgB 12.3, plt 124 Na 138, K 4.2, CO2 of 26, BUN 25, Scr 1.41 UA glu >500, neg nitrite, Neg LE BNP 313 TSH 35.78 B12 664  Admission Imaging Studies: CXR Stable chronic changes. 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  recommend consultation of a neuro interventionalist. 2. Fetal origins of both posterior cerebral arteries. 3. Calcific plaque within the carotid bulbs and origins of the internal carotid arteries bilaterally, with less than 30% luminal stenosis. 4. Mild to moderate stenosis of the left posterior cerebral artery and the left superior cerebellar artery  Significant Labs: FT4 0.65, TSH 35  Significant Imaging Studies:   Antibiotic Therapy: Anti-infectives (From admission, onward)    None       Procedures:   Consultants: Vascular surgery Neurology Neuro-Interventional Radiology    Assessment and Plan: * Weakness generalized Prior to 02-13-2024. 83 year old presenting to ED with vague complaints of weakness, difficulty ambulating and feeling off balance and some electrical shocks going down his arm who lives alone and is independent with all ADLs -obs  to tele at [facility]  -concern for possible CVA with weakness/difficulty ambulating and some ataxia noted by EDP  -neurology consulted by EDP, Dr. Matthews.  -He is declining MRI at this time. 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 Neuro IR consulted.  -TSH, B12, magnesium   -TSH >30, was normal in May  2025. Likely not taking his medication. Free T4 pending  -PT/OT  02-13-2024 pt refused MRI. Home OT/PT recommended. Pt and family refusing home PT/OT. Stable for DC to home.  Discussed with pt's dtr [name] via phone. Discussed that pt is declining. She affirms that pt would not want home PT/OT referral. I offered to consult outpatient hospice. She does not want hospice referral right now. She will talk to pt and call hospice on her own. She states she is familiar with hospice as pt's wife was in hospice care 4 years ago before her death. Discussed discharge today. Dtr is agreeable to taking him home. Dtr is aware that pt will need portable O2 for home use. Pt refuses to go to SNF.   02-14-2024 pt changed his mind and now wants SNF placement. Also changed his mind about MRI brian. Will order brain MRI for tomorrow since he has PPM. Appears PPM is MRI compatible. Will also order IV ativan  for sedation for MRI brain. TOC aware of SNF placement request.  Cerebral aneurysm Prior to 02-13-2024. 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 See their consult note.  S/P femoral-tibial bypass Prior to 02-13-2024. 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 Per vascular note he has not been symptomatic. Not weaker on left side.   New finding of absent DP doppler per EDP  EDP discussed with vascular on call and they plan on seeing him.   02-13-2024 vascular surgery consult noted. He has no worrisome findings on history or physical exam to suggest vascular catastrophe. Doubt this is the source of his weakness.   02-14-2024 stable.  Hypothyroidism Prior to 02-13-2024. TSH wnl in May of 2025 Repeat TSH ABNORMAL.  ? If not taking his medication???  Continue current synthroid  at 125mcg daily   02-13-2024 not sure what to make of TSH 35.78 with a normal FT4 of 0.65. he will need repeat TSH in office in 4 weeks. And  maybe outpatient endocrinology consult if TSH remains abnormal. Leave on synthroid  125 mcg daily.  02-14-2024 repeat FT4 and TSH. Pt is NOT hyperthyroid. He has normal FT4 level. Not sure what to make of his elevated TSH.  If his repeat TSH is elevated, he can go see outpatient endocrinology. There is no endocrine consultation in the hospital. This is not a problem that needs to be address in the hospital.  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 Baseline creatinine 1.3-1.4  Coronary artery disease involving coronary bypass graft of native heart with angina pectoris (HCC) 02-14-2024 Stable. Continue medical management   Pacemaker 02-14-2024 Stable. 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 Interrogation order placed   Anemia due to chronic kidney disease 02-14-2024 Baseline hgb around 12, stable   Idiopathic thrombocytopenic purpura (ITP) (HCC) 02-14-2024 Platelets stable. Continue SCDs VTE ppx. Continue daily prednisone  10 mg  Chronic respiratory failure with hypoxia (HCC) 02-14-2024 Stable on 3-4L oxygen  PRN   PAF (paroxysmal atrial fibrillation) (HCC) 02-14-2024  Stable No longer on AC, on ASA daily. Continue ASA and coreg    COPD (chronic obstructive pulmonary disease) (HCC) 02-14-2024 Stable, continue home nebs/meds. Continue yupelri , brovana  albuterol  PRN   Diabetes mellitus with complication, with long-term current use of insulin  (HCC) Prior to 02-13-2024. A1C in May of 2025 of 8.6  Continue long acting insulin  SSI QAC/HS  Continue jardiance    02-13-2024 continue home insulins.  02-14-2024 CBG stable.   OSA on CPAP Continue cpap nightly   Essential hypertension Prior to 02-13-2024. Elevated, has not had medication today Continue losartan  100mg  daily, coreg  3.125mg  BID and takes lasix  20mg  daily PRN  02-13-2024 BP is stable now. Continue home meds.  02-14-2024 BP stable. Continue cozaar  and lasix .  DNR (do not resuscitate)/DNI(Do Not  intubate) Verified DNR/DNI with pt's dtr [name].     DVT prophylaxis: SCDs Start: 02/11/24 1620    Code Status: Limited: Do not attempt resuscitation (DNR) -DNR-LIMITED -Do Not Intubate/DNI  Family Communication: no family at bedside Disposition Plan: SNF Reason for continuing need for hospitalization: medically stable for discharge.  Objective: Vitals:   02/13/24 2326 02/14/24 0343 02/14/24 0712 02/14/24 1139  BP: (!) 168/67 (!) 155/94 (!) 155/86 117/66  Pulse:  (!) 42 71 (!) 51  Resp: 18 16 (!) 22 (!) 21  Temp: 99 F (37.2 C) 97.7 F (36.5 C) 97.7 F (36.5 C) 97.7 F (36.5 C)  TempSrc:  Oral Oral Oral  SpO2:  94% 90% 94%  Weight:      Height:        Intake/Output Summary (Last 24 hours) at 02/14/2024 1248 Last data filed at 02/14/2024 0900 Gross per 24 hour  Intake 120 ml  Output 1600 ml  Net -1480 ml   Filed Weights   02/13/24 1426  Weight: 88.5 kg    Examination:  Physical Exam Vitals and nursing note reviewed.  Constitutional:      Comments: Chronically ill appearing  HENT:     Head: Normocephalic and atraumatic.  Eyes:     General: No scleral icterus. Cardiovascular:     Rate and Rhythm: Normal rate and regular rhythm.  Pulmonary:     Effort: Pulmonary effort is normal. No respiratory distress.     Breath sounds: Normal breath sounds.  Abdominal:     General: Bowel sounds are normal. There is no distension.     Palpations: Abdomen is soft.  Skin:    General: Skin is warm and dry.     Capillary Refill: Capillary refill takes less than 2 seconds.  Neurological:     Mental Status: He is alert and oriented to person, place, and time.     Comments: Tremors of both UE     Data Reviewed: I have personally reviewed following labs and imaging studies  CBC: Recent Labs  Lab 02/11/24 0847 02/12/24 0257  WBC 6.9 8.6  HGB 12.3* 14.6  HCT 41.1 46.3  MCV 89.0 86.5  PLT 124* 134*   Basic Metabolic Panel: Recent Labs  Lab 02/11/24 0847  02/11/24 1744 02/12/24 0257 02/14/24 0520  NA 138  --  139 138  K 4.2  --  4.5 4.6  CL 101  --  99 95*  CO2 26  --  28 27  GLUCOSE 160*  --  148* 161*  BUN 25*  --  20 26*  CREATININE 1.41*  --  1.40* 1.36*  CALCIUM  9.2  --  9.4 10.0  MG  --  1.8  --   --  GFR: Estimated Creatinine Clearance: 48.7 mL/min (A) (by C-G formula based on SCr of 1.36 mg/dL (H)). Liver Function Tests: Recent Labs  Lab 02/11/24 0847  AST 22  ALT 17  ALKPHOS 63  BILITOT 0.9  PROT 7.4  ALBUMIN  3.7   BNP (last 3 results) Recent Labs    12/03/23 1607 01/19/24 1000 02/11/24 1324  BNP 68.4 285.1* 313.7*   CBG: Recent Labs  Lab 02/13/24 1242 02/13/24 1656 02/13/24 2105 02/14/24 0612 02/14/24 1138  GLUCAP 169* 123* 90 156* 190*   Thyroid  Function Tests: Recent Labs    02/11/24 1744 02/12/24 0257 02/14/24 0520  TSH 35.783*  --   --   FREET4  --    < > 0.67   < > = values in this interval not displayed.   Anemia Panel: Recent Labs    02/11/24 1744  VITAMINB12 664   Radiology Studies: ECHOCARDIOGRAM COMPLETE Result Date: 02/13/2024    ECHOCARDIOGRAM REPORT   Patient Name:   Simran Mannis. Date of Exam: 02/13/2024 Medical Rec #:  [id number]              Height:       74.0 in Accession #:    [id number]             Weight:       195.0 lb Date of Birth:  04/13/1941              BSA:          2.150 m Patient Age:    83 years               BP:           154/96 mmHg Patient Gender: M                      HR:           66 bpm. Exam Location:  Inpatient Procedure: 2D Echo, Cardiac Doppler and Color Doppler (Both Spectral and Color            Flow Doppler were utilized during procedure). Indications:    Dyspnea  History:        Patient has prior history of Echocardiogram examinations. CHF,                 CAD, Pacemaker, COPD, Arrythmias:Atrial Fibrillation; Risk                 Factors:Hypertension and Diabetes.  Sonographer:    Christiana Mbomeh Referring Phys: [id number] QJMYJW RASHID   Sonographer Comments: Image acquisition challenging due to patient body habitus and TDS Supine. IMPRESSIONS  1. Left ventricular ejection fraction, by estimation, is 45 to 50%. The left ventricle has mildly decreased function. The left ventricle demonstrates regional wall motion abnormalities (see scoring diagram/findings for description). There is moderate concentric left ventricular hypertrophy. Left ventricular diastolic parameters are consistent with Grade I diastolic dysfunction (impaired relaxation). There is severe hypokinesis of the left ventricular, basal-mid inferior wall, inferolateral wall and inferoseptal wall.  2. Right ventricular systolic function is mildly reduced. The right ventricular size is mildly enlarged. Tricuspid regurgitation signal is inadequate for assessing PA pressure.  3. Left atrial size was mildly dilated.  4. The mitral valve is normal in structure. No evidence of mitral valve regurgitation. No evidence of mitral stenosis.  5. The aortic valve is tricuspid. There is mild calcification of the aortic valve. Aortic valve regurgitation is trivial. Aortic valve  sclerosis/calcification is present, without any evidence of aortic stenosis.  6. The inferior vena cava is normal in size with greater than 50% respiratory variability, suggesting right atrial pressure of 3 mmHg. Comparison(s): The left ventricular function is worsened. The left ventricular wall motion abnormality is new. FINDINGS  Left Ventricle: Left ventricular ejection fraction, by estimation, is 45 to 50%. The left ventricle has mildly decreased function. The left ventricle demonstrates regional wall motion abnormalities. Severe hypokinesis of the left ventricular, basal-mid inferior wall, inferolateral wall and inferoseptal wall. The left ventricular internal cavity size was normal in size. There is moderate concentric left ventricular hypertrophy. Abnormal (paradoxical) septal motion, consistent with RV pacemaker. Left  ventricular diastolic parameters are consistent with Grade I diastolic dysfunction (impaired relaxation). Indeterminate filling pressures.  LV Wall Scoring: The inferior wall, posterior wall, mid inferoseptal segment, and basal inferoseptal segment are hypokinetic. Right Ventricle: The right ventricular size is mildly enlarged. Right vetricular wall thickness was not well visualized. Right ventricular systolic function is mildly reduced. Tricuspid regurgitation signal is inadequate for assessing PA pressure. Left Atrium: Left atrial size was mildly dilated. Right Atrium: Right atrial size was not well visualized. Pericardium: Trivial pericardial effusion is present. Mitral Valve: The mitral valve is normal in structure. Mild mitral annular calcification. No evidence of mitral valve regurgitation. No evidence of mitral valve stenosis. Tricuspid Valve: The tricuspid valve is normal in structure. Tricuspid valve regurgitation is not demonstrated. Aortic Valve: The aortic valve is tricuspid. There is mild calcification of the aortic valve. Aortic valve regurgitation is trivial. Aortic valve sclerosis/calcification is present, without any evidence of aortic stenosis. Aortic valve mean gradient measures 2.0 mmHg. Aortic valve peak gradient measures 4.9 mmHg. Aortic valve area, by VTI measures 2.98 cm. Pulmonic Valve: The pulmonic valve was not well visualized. Pulmonic valve regurgitation is not visualized. No evidence of pulmonic stenosis. Aorta: The aortic root is normal in size and structure. Venous: The inferior vena cava is normal in size with greater than 50% respiratory variability, suggesting right atrial pressure of 3 mmHg. IAS/Shunts: The interatrial septum was not well visualized. Additional Comments: A device lead is visualized in the right ventricle and right atrium.  LEFT VENTRICLE PLAX 2D LVIDd:         4.80 cm   Diastology LVIDs:         3.96 cm   LV e' medial:    3.59 cm/s LV PW:         1.40 cm   LV  E/e' medial:  12.7 LV IVS:        1.50 cm   LV e' lateral:   3.37 cm/s LVOT diam:     2.20 cm   LV E/e' lateral: 13.5 LV SV:         61 LV SV Index:   28 LVOT Area:     3.80 cm  RIGHT VENTRICLE         IVC TAPSE (M-mode): 1.8 cm  IVC diam: 1.70 cm LEFT ATRIUM             Index LA Vol (A2C):   61.1 ml 28.42 ml/m LA Vol (A4C):   49.7 ml 23.11 ml/m LA Biplane Vol: 60.3 ml 28.04 ml/m  AORTIC VALVE AV Area (Vmax):    2.91 cm AV Area (Vmean):   2.79 cm AV Area (VTI):     2.98 cm AV Vmax:           111.00 cm/s AV Vmean:  71.000 cm/s AV VTI:            0.204 m AV Peak Grad:      4.9 mmHg AV Mean Grad:      2.0 mmHg LVOT Vmax:         84.90 cm/s LVOT Vmean:        52.200 cm/s LVOT VTI:          0.160 m LVOT/AV VTI ratio: 0.78  AORTA Ao Root diam: 3.60 cm MITRAL VALVE MV Area (PHT): 2.74 cm    SHUNTS MV Decel Time: 277 msec    Systemic VTI:  0.16 m MV E velocity: 45.60 cm/s  Systemic Diam: 2.20 cm MV A velocity: 75.50 cm/s MV E/A ratio:  0.60 Mihai Croitoru MD Electronically signed by Jerel Balding MD Signature Date/Time: 02/13/2024/1:19:29 PM    Final     Scheduled Meds:  arformoterol   15 mcg Nebulization BID   aspirin   81 mg Oral Daily   atorvastatin   40 mg Oral Daily   furosemide   20 mg Oral Daily   guaiFENesin   400 mg Oral BID   insulin  aspart  0-15 Units Subcutaneous TID WC   levothyroxine   125 mcg Oral QAC breakfast   losartan   100 mg Oral Daily   predniSONE   10 mg Oral Q breakfast   pregabalin   100 mg Oral QHS   revefenacin   175 mcg Nebulization Daily   tamsulosin   0.4 mg Oral QHS   traZODone   50 mg Oral QHS   cyanocobalamin   500 mcg Oral Daily   Continuous Infusions:   LOS: 0 days   Time spent: 55 minutes  Camellia Door, DO  [facility]  02/14/2024, 12:48 PM

## 2024-02-14 NOTE — Plan of Care (Signed)

## 2024-02-15 ENCOUNTER — Observation Stay (HOSPITAL_COMMUNITY)

## 2024-02-15 ENCOUNTER — Encounter (HOSPITAL_COMMUNITY): Payer: Self-pay | Admitting: Family Medicine

## 2024-02-15 DIAGNOSIS — R531 Weakness: Secondary | ICD-10-CM | POA: Diagnosis not present

## 2024-02-15 DIAGNOSIS — R9082 White matter disease, unspecified: Secondary | ICD-10-CM | POA: Diagnosis not present

## 2024-02-15 DIAGNOSIS — R9089 Other abnormal findings on diagnostic imaging of central nervous system: Secondary | ICD-10-CM | POA: Diagnosis not present

## 2024-02-15 DIAGNOSIS — Z471 Aftercare following joint replacement surgery: Secondary | ICD-10-CM | POA: Diagnosis not present

## 2024-02-15 DIAGNOSIS — J9611 Chronic respiratory failure with hypoxia: Secondary | ICD-10-CM | POA: Diagnosis not present

## 2024-02-15 DIAGNOSIS — I480 Paroxysmal atrial fibrillation: Secondary | ICD-10-CM | POA: Diagnosis not present

## 2024-02-15 DIAGNOSIS — Z515 Encounter for palliative care: Secondary | ICD-10-CM

## 2024-02-15 DIAGNOSIS — I671 Cerebral aneurysm, nonruptured: Secondary | ICD-10-CM | POA: Diagnosis not present

## 2024-02-15 DIAGNOSIS — N1831 Chronic kidney disease, stage 3a: Secondary | ICD-10-CM | POA: Diagnosis not present

## 2024-02-15 DIAGNOSIS — E039 Hypothyroidism, unspecified: Secondary | ICD-10-CM | POA: Diagnosis not present

## 2024-02-15 DIAGNOSIS — R251 Tremor, unspecified: Secondary | ICD-10-CM | POA: Diagnosis not present

## 2024-02-15 DIAGNOSIS — D631 Anemia in chronic kidney disease: Secondary | ICD-10-CM | POA: Diagnosis not present

## 2024-02-15 DIAGNOSIS — J432 Centrilobular emphysema: Secondary | ICD-10-CM | POA: Diagnosis not present

## 2024-02-15 LAB — GLUCOSE, CAPILLARY
Glucose-Capillary: 222 mg/dL — ABNORMAL HIGH (ref 70–99)
Glucose-Capillary: 223 mg/dL — ABNORMAL HIGH (ref 70–99)
Glucose-Capillary: 246 mg/dL — ABNORMAL HIGH (ref 70–99)

## 2024-02-15 LAB — BLOOD GAS, ARTERIAL
Acid-Base Excess: 6.5 mmol/L — ABNORMAL HIGH (ref 0.0–2.0)
Bicarbonate: 32.8 mmol/L — ABNORMAL HIGH (ref 20.0–28.0)
O2 Saturation: 94.3 %
Patient temperature: 36.3
pCO2 arterial: 51 mmHg — ABNORMAL HIGH (ref 32–48)
pH, Arterial: 7.41 (ref 7.35–7.45)
pO2, Arterial: 64 mmHg — ABNORMAL LOW (ref 83–108)

## 2024-02-15 MED ORDER — LORAZEPAM 2 MG/ML IJ SOLN
1.0000 mg | INTRAMUSCULAR | Status: AC | PRN
  Administered 2024-02-15: 1 mg via INTRAVENOUS
  Filled 2024-02-15: qty 1

## 2024-02-15 MED ORDER — GLYCOPYRROLATE 0.2 MG/ML IJ SOLN
0.2000 mg | INTRAMUSCULAR | Status: DC | PRN
  Administered 2024-02-15: 0.2 mg via INTRAVENOUS
  Filled 2024-02-15: qty 1

## 2024-02-15 MED ORDER — LORAZEPAM 2 MG/ML IJ SOLN
1.0000 mg | INTRAMUSCULAR | Status: DC | PRN
  Administered 2024-02-15: 1 mg via INTRAVENOUS
  Filled 2024-02-15: qty 1

## 2024-02-15 MED ORDER — LORAZEPAM 1 MG PO TABS: 1.0000 mg | ORAL_TABLET | ORAL | Status: DC | PRN

## 2024-02-15 MED ORDER — GLYCOPYRROLATE 0.2 MG/ML IJ SOLN: 0.2000 mg | INTRAMUSCULAR | Status: DC | PRN

## 2024-02-15 MED ORDER — ALBUTEROL SULFATE (2.5 MG/3ML) 0.083% IN NEBU: 2.5000 mg | INHALATION_SOLUTION | RESPIRATORY_TRACT | Status: DC | PRN

## 2024-02-15 MED ORDER — GLYCOPYRROLATE 1 MG PO TABS: 1.0000 mg | ORAL_TABLET | ORAL | Status: DC | PRN

## 2024-02-15 MED ORDER — PRIMIDONE 50 MG PO TABS
50.0000 mg | ORAL_TABLET | Freq: Three times a day (TID) | ORAL | Status: DC
  Administered 2024-02-15: 50 mg via ORAL
  Filled 2024-02-15 (×2): qty 1

## 2024-02-15 MED ORDER — ALBUTEROL (5 MG/ML) CONTINUOUS INHALATION SOLN
10.0000 mg/h | INHALATION_SOLUTION | RESPIRATORY_TRACT | Status: AC
  Administered 2024-02-15: 10 mg/h via RESPIRATORY_TRACT
  Filled 2024-02-15 (×2): qty 20

## 2024-02-15 MED ORDER — LORAZEPAM 2 MG/ML PO CONC
1.0000 mg | ORAL | Status: DC | PRN
Start: 2024-02-15 — End: 2024-02-16

## 2024-02-15 MED ORDER — ALBUTEROL (5 MG/ML) CONTINUOUS INHALATION SOLN: 10.0000 mg/h | INHALATION_SOLUTION | RESPIRATORY_TRACT | Status: DC

## 2024-02-15 MED ORDER — MORPHINE SULFATE (PF) 2 MG/ML IV SOLN
1.0000 mg | INTRAVENOUS | Status: DC | PRN
  Administered 2024-02-15: 2 mg via INTRAVENOUS
  Administered 2024-02-15: 4 mg via INTRAVENOUS
  Administered 2024-02-15: 1 mg via INTRAVENOUS
  Administered 2024-02-15 – 2024-02-16 (×2): 2 mg via INTRAVENOUS
  Administered 2024-02-16 (×2): 4 mg via INTRAVENOUS
  Administered 2024-02-16: 2 mg via INTRAVENOUS
  Filled 2024-02-15 (×5): qty 1
  Filled 2024-02-15 (×3): qty 2

## 2024-02-15 NOTE — Progress Notes (Signed)
   Pt had respiratory distress after returning from MRI brain. 10 mg albuterol  neb treatment given. ABG shows compensated chronic respiratory hypercapnia with normal pH of 7.4  After 10 mg albuterol  neb. No wheezing heard. Met with pt's dtr [name] at bedside. She desires SNF placement.  Camellia Door, DO [facility]

## 2024-02-15 NOTE — Progress Notes (Signed)
 ABG collected and sent to lab, lab notified. RN aware, MD aware.

## 2024-02-15 NOTE — Progress Notes (Signed)
 RT at bedside to assess pt post CAT. Pt tachypneic , course crackles BBS. Pt Spo2 86% on 6L [place]. RT placed pt on 10L HFNC SPO2  increased to 90-91%. RN at bedside, MD aware, no new orders given at this time.      02/15/24 1450  Therapy Vitals  Pulse Rate 72  Resp (!) 26  MEWS Score/Color  MEWS Score 2  MEWS Score Color Yellow  Respiratory Assessment  Assessment Type Assess only  Respiratory Pattern Tachypnea  Chest Assessment Chest expansion symmetrical  Cough None  Bilateral Breath Sounds Diminished;Coarse crackles  Oxygen  Therapy/Pulse Ox  O2 Device (S)  HFNC  O2 Therapy Oxygen  humidified  O2 Flow Rate (L/min) 10 L/min  SpO2 90 %

## 2024-02-15 NOTE — Progress Notes (Signed)
 PROGRESS NOTE    [name].  FMW:[id number] DOB: [birth year] DOA: 02/11/2024 PCP: Rollene Almarie LABOR, MD  Subjective: Pt seen and examined. Going to MRI Brain today. Still having tremors of both UE.   Hospital Course: HPI:  [name] is a 83 y.o. 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 He had diarrhea about 3 days ago and had one day with about 4 episodes of watery diarrhea. He felt a little bit more weak after this and he increased his water intake. He then thought he over drank and took some extra diuretics. He got up yesterday and he felt a little weak and had more trouble maneuvering his walker which is new for him. He seemed to get weaker as the day progressed. Today he couldn't really walk well and had some strange feelings behind his right ear. He had pain shooting down his left arm that felt like little shocks. He also had a headache. With the weakness he came to ED.    Baseline he is independent with all ADLs. Still cooks his own meals, showers, and drives. His daughter comes once a week and will help or run errands with him if he needs anything. He does all of his own finances.    Admitted 01/19/24 for acute on chronic respiratory failure in setting of severe COPD and recurrent moderate left pleural effusion of unclear etiology. Chest tube placed and removed 6/26. Cytology negative for malignant cells.      Denies any fever/chills, vision changes/headaches, chest pain or palpitations, shortness of breath or cough, abdominal pain, N/V/D, dysuria or leg swelling.      He does not smoke or drink  alcohol .   Significant Events: Admitted 02/11/2024 for generalized weakness   Admission Labs: WBC 6.9, HgB 12.3, plt 124 Na 138, K 4.2, CO2 of 26, BUN 25, Scr 1.41 UA glu >500, neg nitrite, Neg LE BNP 313 TSH 35.78 B12 664  Admission Imaging Studies: CXR Stable chronic changes. 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  recommend consultation of a neuro interventionalist. 2. Fetal origins of both posterior cerebral arteries. 3. Calcific plaque within the carotid bulbs and origins of the internal carotid arteries bilaterally, with less than 30% luminal stenosis. 4. Mild to moderate stenosis of the left posterior cerebral artery and the left superior cerebellar artery  Significant Labs: FT4 0.65, TSH 35  Significant Imaging Studies:   Antibiotic Therapy: Anti-infectives (From admission, onward)    None       Procedures:   Consultants: Vascular surgery Neurology Neuro-Interventional Radiology    Assessment and Plan: * Weakness generalized Prior to 02-13-2024. 83 year old presenting to ED with vague complaints of weakness, difficulty ambulating and feeling off balance and some electrical shocks going down his arm who lives alone and is independent with all ADLs -obs to tele at [facility]  -concern for possible CVA with weakness/difficulty ambulating and some ataxia noted by EDP  -neurology consulted by EDP, Dr. Matthews.  -He is declining MRI at this time. CTA head/neck with no occlusion, but noted cerebral  aneurysm and mild to moderate stenosis of the left posterior cerebral artery and the left superior cerebellar artery. Neuro IR consulted.  -TSH, B12, magnesium   -TSH >30, was normal in May 2025. Likely not taking his medication. Free T4 pending  -PT/OT  02-13-2024 pt refused MRI. Home OT/PT recommended. Pt and family refusing home PT/OT. Stable for DC to home.  Discussed with pt's  dtr [name] via phone. Discussed that pt is declining. She affirms that pt would not want home PT/OT referral. I offered to consult outpatient hospice. She does not want hospice referral right now. She will talk to pt and call hospice on her own. She states she is familiar with hospice as pt's wife was in hospice care 4 years ago before her death. Discussed discharge today. Dtr is agreeable to taking him home. Dtr is aware that pt will need portable O2 for home use. Pt refuses to go to SNF.   02-14-2024 pt changed his mind and now wants SNF placement. Also changed his mind about MRI brian. Will order brain MRI for tomorrow since he has PPM. Appears PPM is MRI compatible. Will also order IV ativan  for sedation for MRI brain. TOC aware of SNF placement request.  02-15-2024 MRI brain today. Pt wants to go to SNF. IV ativan  ordered for claustrophobia with MRI. Given his admission was 4 days ago, I doubt anything on his MRI brain is going to change his management. Pt wants to go to SNF. He refused discharge to home. Add Primidone  50 mg tid for his tremors.   Cerebral aneurysm Prior to 02-13-2024. 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 See their consult note.  02-15-2024 NIR not planning on any interventions.  S/P femoral-tibial bypass Prior to 02-13-2024. 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 Per vascular note he has not been symptomatic. Not weaker on left side.   New finding of absent DP doppler per EDP  EDP discussed with vascular on call and they plan on seeing him.   02-13-2024 vascular surgery consult noted. He has no worrisome findings on history or physical exam to suggest vascular catastrophe. Doubt this is the source of his weakness.   02-14-2024 stable.  02-15-2024 stable  Hypothyroidism Prior to 02-13-2024. TSH wnl in May of 2025 Repeat TSH ABNORMAL.  ? If not taking his medication???   Continue current synthroid  at 125mcg daily   02-13-2024 not sure what to make of TSH 35.78 with a normal FT4 of 0.65. he will need repeat TSH in office in 4 weeks. And maybe outpatient endocrinology consult if TSH remains abnormal. Leave on synthroid  125 mcg daily.  02-14-2024 repeat FT4 and TSH. Pt is NOT hyperthyroid. He has normal FT4 level. Not sure what to make of his elevated TSH.  If his repeat TSH is elevated, he can go see outpatient endocrinology. There is no endocrine consultation in the hospital. This is not a problem that needs to be address in the hospital.  02-15-2024 repeat TSH 23.8. continue with his same dose of synthroid . His repeat FT4 was 0.67.  no utility in repeating his TSH any sooner than in 6 weeks.  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 Baseline creatinine 1.3-1.4  02-15-2024 stable  Coronary artery disease involving coronary bypass graft of native heart with angina pectoris (HCC) 02-14-2024 Stable. Continue medical management   02-15-2024 stable  Pacemaker 02-14-2024 Stable. 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 Interrogation order placed   02-15-2024 stable  Anemia due to chronic kidney disease 02-14-2024 Baseline hgb around 12, stable   02-15-2024 stable  Idiopathic thrombocytopenic purpura (ITP) (HCC) 02-14-2024 Platelets stable. Continue SCDs VTE ppx. Continue daily prednisone  10 mg  02-15-2024 stable  Chronic respiratory failure with hypoxia (HCC) - 3-5 L/min home O2 02-14-2024 Stable on 3-4L oxygen    02-15-2024 stable on O2.  PAF (paroxysmal atrial fibrillation) (HCC) 02-14-2024 Stable No longer on AC, on ASA daily. Continue ASA and coreg    02-15-2024 stable  COPD (chronic obstructive pulmonary disease) (HCC) 02-14-2024 Stable, continue home nebs/meds. Continue yupelri , brovana  albuterol  PRN   02-15-2024 stable  Diabetes mellitus with complication, with long-term current use of insulin  (HCC) Prior to  02-13-2024. A1C in May of 2025 of 8.6  Continue long acting insulin  SSI QAC/HS  Continue jardiance    02-13-2024 continue home insulins.  02-14-2024 CBG stable.   02-15-2024 stable CBG  OSA on CPAP Continue cpap nightly   Essential hypertension Prior to 02-13-2024. Elevated, has not had medication today Continue losartan  100mg  daily, coreg  3.125mg  BID and takes lasix  20mg  daily PRN  02-13-2024 BP is stable now. Continue home meds.  02-14-2024 BP stable. Continue cozaar  and lasix .  02-15-2024 stable  DNR (do not resuscitate)/DNI(Do Not intubate) Verified DNR/DNI with pt's dtr [name].    DVT prophylaxis: SCDs Start: 02/11/24 1620    Code Status: Limited: Do not attempt resuscitation (DNR) -DNR-LIMITED -Do Not Intubate/DNI  Family Communication: no family at bedside Disposition Plan: SNF Reason for continuing need for hospitalization: stable for DC to SNF.  Objective: Vitals:   02/14/24 2025 02/14/24 2324 02/15/24 0316 02/15/24 0742  BP:  (!) 142/87 128/80 (!) 147/64  Pulse:  62 67 61  Resp:  18  18  Temp:  97.9 F (36.6 C) 97.7 F (36.5 C) (!) 97.5 F (36.4 C)  TempSrc:  Oral Oral Oral  SpO2: 94% 94% 98% 93%  Weight:      Height:        Intake/Output Summary (Last 24 hours) at 02/15/2024 1013 Last data filed at 02/15/2024 0454 Gross per 24 hour  Intake 240 ml  Output 725 ml  Net -485 ml   Filed Weights   02/13/24 1426  Weight: 88.5 kg    Examination:  Physical Exam Vitals and nursing note reviewed.  Constitutional:      General: He is not in acute distress.    Appearance: He is not toxic-appearing or diaphoretic.     Comments: Appears chronically ill  HENT:     Head: Normocephalic and atraumatic.     Nose: Nose normal.  Cardiovascular:     Rate and Rhythm: Normal rate.  Pulmonary:     Effort: Pulmonary effort is normal. No respiratory distress.     Breath sounds: No wheezing.  Abdominal:     General: Bowel sounds are normal.     Palpations:  Abdomen is soft.  Skin:    General: Skin is warm and dry.     Capillary Refill: Capillary refill takes less than 2 seconds.  Neurological:     Mental Status: He is oriented to person, place, and time.     Comments: Bilateral UE resting tremors     Data Reviewed: I have personally reviewed following labs and imaging studies  CBC: Recent Labs  Lab 02/11/24 0847 02/12/24 0257  WBC 6.9 8.6  HGB 12.3* 14.6  HCT 41.1 46.3  MCV 89.0 86.5  PLT 124* 134*   Basic Metabolic Panel: Recent Labs  Lab 02/11/24 0847 02/11/24 1744 02/12/24 0257 02/14/24 0520  NA 138  --  139 138  K 4.2  --  4.5 4.6  CL 101  --  99 95*  CO2 26  --  28 27  GLUCOSE 160*  --  148* 161*  BUN 25*  --  20 26*  CREATININE 1.41*  --  1.40* 1.36*  CALCIUM  9.2  --  9.4 10.0  MG  --  1.8  --   --    GFR: Estimated Creatinine Clearance: 48.7 mL/min (A) (by C-G formula based on SCr of 1.36 mg/dL (H)). Liver Function Tests: Recent Labs  Lab 02/11/24 0847  AST 22  ALT 17  ALKPHOS 63  BILITOT 0.9  PROT 7.4  ALBUMIN  3.7   BNP (last 3 results) Recent Labs    12/03/23 1607 01/19/24 1000 02/11/24 1324  BNP 68.4 285.1* 313.7*   CBG: Recent Labs  Lab 02/14/24 0612 02/14/24 1138 02/14/24 1600 02/14/24 2127 02/15/24 0614  GLUCAP 156* 190* 211* 195* 222*   Thyroid  Function Tests: Recent Labs    02/14/24 0520 02/14/24 0715  TSH  --  23.882*  FREET4 0.67  --    Radiology Studies: ECHOCARDIOGRAM COMPLETE Result Date: 02/13/2024    ECHOCARDIOGRAM REPORT   Patient Name:   Dhyan Noah. Date of Exam: 02/13/2024 Medical Rec #:  [id number]              Height:       74.0 in Accession #:    [id number]             Weight:       195.0 lb Date of Birth:  12-29-1940              BSA:          2.150 m Patient Age:    83 years               BP:           154/96 mmHg Patient Gender: M                      HR:           66 bpm. Exam Location:  Inpatient Procedure: 2D Echo, Cardiac Doppler and Color  Doppler (Both Spectral and Color            Flow Doppler were utilized during procedure). Indications:    Dyspnea  History:        Patient has prior history of Echocardiogram examinations. CHF,                 CAD, Pacemaker, COPD, Arrythmias:Atrial Fibrillation; Risk                 Factors:Hypertension and Diabetes.  Sonographer:    Christiana Mbomeh Referring Phys: [id number] QJMYJW RASHID  Sonographer Comments: Image acquisition challenging due to patient body habitus and TDS Supine. IMPRESSIONS  1. Left ventricular ejection fraction, by estimation, is 45 to 50%. The left ventricle has mildly decreased function. The left ventricle demonstrates regional wall motion abnormalities (see scoring diagram/findings for description). There is moderate concentric left ventricular hypertrophy. Left ventricular diastolic parameters are consistent with Grade I diastolic dysfunction (impaired relaxation). There is severe hypokinesis of the left ventricular, basal-mid inferior wall, inferolateral wall and inferoseptal wall.  2. Right ventricular systolic function is mildly reduced. The right  ventricular size is mildly enlarged. Tricuspid regurgitation signal is inadequate for assessing PA pressure.  3. Left atrial size was mildly dilated.  4. The mitral valve is normal in structure. No evidence of mitral valve regurgitation. No evidence of mitral stenosis.  5. The aortic valve is tricuspid. There is mild calcification of the aortic valve. Aortic valve regurgitation is trivial. Aortic valve sclerosis/calcification is present, without any evidence of aortic stenosis.  6. The inferior vena cava is normal in size with greater than 50% respiratory variability, suggesting right atrial pressure of 3 mmHg. Comparison(s): The left ventricular function is worsened. The left ventricular wall motion abnormality is new. FINDINGS  Left Ventricle: Left ventricular ejection fraction, by estimation, is 45 to 50%. The left ventricle has mildly  decreased function. The left ventricle demonstrates regional wall motion abnormalities. Severe hypokinesis of the left ventricular, basal-mid inferior wall, inferolateral wall and inferoseptal wall. The left ventricular internal cavity size was normal in size. There is moderate concentric left ventricular hypertrophy. Abnormal (paradoxical) septal motion, consistent with RV pacemaker. Left ventricular diastolic parameters are consistent with Grade I diastolic dysfunction (impaired relaxation). Indeterminate filling pressures.  LV Wall Scoring: The inferior wall, posterior wall, mid inferoseptal segment, and basal inferoseptal segment are hypokinetic. Right Ventricle: The right ventricular size is mildly enlarged. Right vetricular wall thickness was not well visualized. Right ventricular systolic function is mildly reduced. Tricuspid regurgitation signal is inadequate for assessing PA pressure. Left Atrium: Left atrial size was mildly dilated. Right Atrium: Right atrial size was not well visualized. Pericardium: Trivial pericardial effusion is present. Mitral Valve: The mitral valve is normal in structure. Mild mitral annular calcification. No evidence of mitral valve regurgitation. No evidence of mitral valve stenosis. Tricuspid Valve: The tricuspid valve is normal in structure. Tricuspid valve regurgitation is not demonstrated. Aortic Valve: The aortic valve is tricuspid. There is mild calcification of the aortic valve. Aortic valve regurgitation is trivial. Aortic valve sclerosis/calcification is present, without any evidence of aortic stenosis. Aortic valve mean gradient measures 2.0 mmHg. Aortic valve peak gradient measures 4.9 mmHg. Aortic valve area, by VTI measures 2.98 cm. Pulmonic Valve: The pulmonic valve was not well visualized. Pulmonic valve regurgitation is not visualized. No evidence of pulmonic stenosis. Aorta: The aortic root is normal in size and structure. Venous: The inferior vena cava is normal  in size with greater than 50% respiratory variability, suggesting right atrial pressure of 3 mmHg. IAS/Shunts: The interatrial septum was not well visualized. Additional Comments: A device lead is visualized in the right ventricle and right atrium.  LEFT VENTRICLE PLAX 2D LVIDd:         4.80 cm   Diastology LVIDs:         3.96 cm   LV e' medial:    3.59 cm/s LV PW:         1.40 cm   LV E/e' medial:  12.7 LV IVS:        1.50 cm   LV e' lateral:   3.37 cm/s LVOT diam:     2.20 cm   LV E/e' lateral: 13.5 LV SV:         61 LV SV Index:   28 LVOT Area:     3.80 cm  RIGHT VENTRICLE         IVC TAPSE (M-mode): 1.8 cm  IVC diam: 1.70 cm LEFT ATRIUM             Index LA Vol (A2C):   61.1 ml 28.42  ml/m LA Vol (A4C):   49.7 ml 23.11 ml/m LA Biplane Vol: 60.3 ml 28.04 ml/m  AORTIC VALVE AV Area (Vmax):    2.91 cm AV Area (Vmean):   2.79 cm AV Area (VTI):     2.98 cm AV Vmax:           111.00 cm/s AV Vmean:          71.000 cm/s AV VTI:            0.204 m AV Peak Grad:      4.9 mmHg AV Mean Grad:      2.0 mmHg LVOT Vmax:         84.90 cm/s LVOT Vmean:        52.200 cm/s LVOT VTI:          0.160 m LVOT/AV VTI ratio: 0.78  AORTA Ao Root diam: 3.60 cm MITRAL VALVE MV Area (PHT): 2.74 cm    SHUNTS MV Decel Time: 277 msec    Systemic VTI:  0.16 m MV E velocity: 45.60 cm/s  Systemic Diam: 2.20 cm MV A velocity: 75.50 cm/s MV E/A ratio:  0.60 Mihai Croitoru MD Electronically signed by Jerel Balding MD Signature Date/Time: 02/13/2024/1:19:29 PM    Final     Scheduled Meds:  arformoterol   15 mcg Nebulization BID   aspirin   81 mg Oral Daily   atorvastatin   40 mg Oral Daily   furosemide   20 mg Oral Daily   guaiFENesin   400 mg Oral BID   insulin  aspart  0-15 Units Subcutaneous TID WC   levothyroxine   125 mcg Oral QAC breakfast   losartan   100 mg Oral Daily   predniSONE   10 mg Oral Q breakfast   pregabalin   100 mg Oral QHS   primidone   50 mg Oral Q8H   revefenacin   175 mcg Nebulization Daily   tamsulosin   0.4 mg Oral  QHS   traZODone   50 mg Oral QHS   cyanocobalamin   500 mcg Oral Daily   Continuous Infusions:   LOS: 0 days   Time spent: 50 minutes  Camellia Door, DO  [facility]  02/15/2024, 10:13 AM

## 2024-02-15 NOTE — Plan of Care (Signed)
  Problem: Pain Managment: Goal: General experience of comfort will improve and/or be controlled Outcome: Progressing   Problem: Safety: Goal: Ability to remain free from injury will improve Outcome: Progressing   Problem: Skin Integrity: Goal: Risk for impaired skin integrity will decrease Outcome: Progressing

## 2024-02-15 NOTE — Progress Notes (Addendum)
   Discussed with pt's dtr [name]. She does not want pt to suffer. She states she talked to her father yesterday and they both agreed that patient did not want to suffer. Dtr states pt told her that if he could not have the same quality of life he had before his hospitalization, he would want to die. Discussed terminal care/comfort care. Discussed that meds given for pain/anxiety/suffering are not intended to accelerate dying but to treat his symptoms. Discussed that once terminal/comfort care measures were started, they could not be reversed.  Pt's dtr([name]) in agreement and wishes to pursue terminal care measures.  Will start IV morphine  for SOB/air hunger. Prn IV ativan  for anxiety. Hospice house consulted for inpatient transfer to [facility].  Pt is already DNR/DNI. Will change to comfort care/terminal. RN may pronounce death.  Reviewed MRI brain with dtr that it did not show any acute strokes.  Camellia Door, DO [facility]

## 2024-02-15 NOTE — Progress Notes (Signed)
 Heart Failure Navigator Progress Note  Assessed for Heart & Vascular TOC clinic readiness.  Patient does not meet criteria due to has a scheduled [facility] appointment on 03/09/2024. No HF TOC. .   Navigator will sign off at this time.   Stephane Haddock, BSN, Scientist, clinical (histocompatibility and immunogenetics) Only

## 2024-02-15 NOTE — Progress Notes (Signed)
 OT Cancellation Note  Patient Details Name: [name]. MRN: [id number] DOB: 1941/02/21   Cancelled Treatment:    Reason Eval/Treat Not Completed: Patient at procedure or test/ unavailable (transport in room to take pt to MRI, OT to follow-up with pt as able)  02/15/2024  AB, OTR/L  Acute Rehabilitation Services  Office: [phone]   [name] 02/15/2024, 11:16 AM

## 2024-02-15 NOTE — Progress Notes (Signed)
  Device system confirmed to be MRI conditional, with implant date > 6 weeks ago, and no evidence of abandoned or epicardial leads in review of most recent CXR  Device last cleared by EP Provider:  Prentice Passey, PA  Clearance is good through for 1 year as long as parameters remain stable at time of check. If pt undergoes a cardiac device procedure during that time, they should be re-cleared.   Tachy-therapies to be programmed off if applicable with device back to pre-MRI settings after completion of exam.  Medtr[employer] - Programming recommendation received through Medtronic App/Tablet  Suzan Recardo Arna Debby  02/15/2024 11:51 AM

## 2024-02-15 NOTE — TOC Progression Note (Signed)
 Transition of Care (TOC) - Progression Note    Patient Details  Name: [name]. MRN: [id number] Date of Birth: [birth year]  Transition of Care [facility]) CM/SW Contact  Almarie CHRISTELLA Goodie, [place] Phone Number: 02/15/2024, 3:55 PM  Clinical Narrative:   CSW met with patient and daughter at bedside to discuss SNF placement and provide bed offers. Daughter asking about [facility]. RN at bedside doing assessment, patient not doing as well after return from MRI, on breathing treatment. CSW to follow for SNF placement.    Expected Discharge Plan: Skilled Nursing Facility    Expected Discharge Plan and Services         Expected Discharge Date: 02/13/24                                     Social Determinants of Health (SDOH) Interventions SDOH Screenings   Food Insecurity: No Food Insecurity (02/13/2024)  Housing: Low Risk  (02/13/2024)  Transportation Needs: No Transportation Needs (02/13/2024)  Utilities: Not At Risk (02/13/2024)  Alcohol  Screen: Low Risk  (01/11/2024)  Depression (PHQ2-9): Low Risk  (01/11/2024)  Financial Resource Strain: Low Risk  (01/11/2024)  Physical Activity: Insufficiently Active (01/11/2024)  Social Connections: Socially Isolated (02/13/2024)  Stress: No Stress Concern Present (01/11/2024)  Tobacco Use: Medium Risk (02/11/2024)  Health Literacy: Adequate Health Literacy (01/11/2024)    Readmission Risk Interventions    12/06/2023    3:24 PM  Readmission Risk Prevention Plan  Transportation Screening Complete  PCP or Specialist Appt within 5-7 Days Complete  Home Care Screening Complete  Medication Review (RN CM) Complete

## 2024-02-15 NOTE — TOC Progression Note (Addendum)
 Transition of Care (TOC) - Progression Note    Patient Details  Name: [name]. MRN: [id number] Date of Birth: 1941-04-18  Transition of Care [facility]) CM/SW Contact  Rosaline JONELLE Joe, RN Phone Number: 02/15/2024, 3:55 PM  Clinical Narrative:    CM met with the patient at the bedside and daughter was not at the bedside at this time.  Patient is on high flow oxygen  and daughter spoke with the bedside RN earlier and daughter states that she did not want to see him suffer anylonger and was agreeable to inpatient hospice placement.  I called and spoke with the daughter and offered Medicare choice regarding Inpatient hospice placement and states that she would like [facility] for [facility] as first choice.  I called Eleanor Nail, CM with [facility] and she accepted the referral.  Patient was independent at the home prior to admission with DME at the home including home oxygen  at 2-3 L/min [place], CPAP machine, RW x 2, Cane, shower chair.  [facility] to follow up in the am for potential Inpatient Hospice placement.   Expected Discharge Plan: Skilled Nursing Facility    Expected Discharge Plan and Services         Expected Discharge Date: 02/13/24                                     Social Determinants of Health (SDOH) Interventions SDOH Screenings   Food Insecurity: No Food Insecurity (02/13/2024)  Housing: Low Risk  (02/13/2024)  Transportation Needs: No Transportation Needs (02/13/2024)  Utilities: Not At Risk (02/13/2024)  Alcohol  Screen: Low Risk  (01/11/2024)  Depression (PHQ2-9): Low Risk  (01/11/2024)  Financial Resource Strain: Low Risk  (01/11/2024)  Physical Activity: Insufficiently Active (01/11/2024)  Social Connections: Socially Isolated (02/13/2024)  Stress: No Stress Concern Present (01/11/2024)  Tobacco Use: Medium Risk (02/11/2024)  Health Literacy: Adequate Health Literacy (01/11/2024)    Readmission Risk Interventions    12/06/2023     3:24 PM  Readmission Risk Prevention Plan  Transportation Screening Complete  PCP or Specialist Appt within 5-7 Days Complete  Home Care Screening Complete  Medication Review (RN CM) Complete

## 2024-02-15 NOTE — Progress Notes (Signed)
 OT Cancellation Note  Patient Details Name: [name]. MRN: [id number] DOB: 04-21-1941   Cancelled Treatment:    Reason Eval/Treat Not Completed: Patient at procedure or test/ unavailable (Pt recently started albuterol  treatment, will take an extended period of time. OT will follow-up with pt as able.)  02/15/2024  AB, OTR/L  Acute Rehabilitation Services  Office: [phone]   [name] 02/15/2024, 1:44 PM

## 2024-02-16 ENCOUNTER — Telehealth: Payer: Self-pay | Admitting: Hematology and Oncology

## 2024-02-16 DIAGNOSIS — J8410 Pulmonary fibrosis, unspecified: Secondary | ICD-10-CM | POA: Diagnosis present

## 2024-02-16 DIAGNOSIS — D693 Immune thrombocytopenic purpura: Secondary | ICD-10-CM | POA: Diagnosis present

## 2024-02-16 DIAGNOSIS — R5381 Other malaise: Secondary | ICD-10-CM | POA: Diagnosis present

## 2024-02-16 DIAGNOSIS — N1831 Chronic kidney disease, stage 3a: Secondary | ICD-10-CM | POA: Diagnosis present

## 2024-02-16 DIAGNOSIS — E039 Hypothyroidism, unspecified: Secondary | ICD-10-CM | POA: Diagnosis present

## 2024-02-16 DIAGNOSIS — Z95820 Peripheral vascular angioplasty status with implants and grafts: Secondary | ICD-10-CM | POA: Diagnosis not present

## 2024-02-16 DIAGNOSIS — F32A Depression, unspecified: Secondary | ICD-10-CM | POA: Diagnosis present

## 2024-02-16 DIAGNOSIS — E1151 Type 2 diabetes mellitus with diabetic peripheral angiopathy without gangrene: Secondary | ICD-10-CM | POA: Diagnosis present

## 2024-02-16 DIAGNOSIS — J9621 Acute and chronic respiratory failure with hypoxia: Secondary | ICD-10-CM | POA: Diagnosis not present

## 2024-02-16 DIAGNOSIS — I130 Hypertensive heart and chronic kidney disease with heart failure and stage 1 through stage 4 chronic kidney disease, or unspecified chronic kidney disease: Secondary | ICD-10-CM | POA: Diagnosis present

## 2024-02-16 DIAGNOSIS — Z515 Encounter for palliative care: Secondary | ICD-10-CM

## 2024-02-16 DIAGNOSIS — I25709 Atherosclerosis of coronary artery bypass graft(s), unspecified, with unspecified angina pectoris: Secondary | ICD-10-CM | POA: Diagnosis present

## 2024-02-16 DIAGNOSIS — I5042 Chronic combined systolic (congestive) and diastolic (congestive) heart failure: Secondary | ICD-10-CM | POA: Diagnosis present

## 2024-02-16 DIAGNOSIS — Z66 Do not resuscitate: Secondary | ICD-10-CM | POA: Diagnosis present

## 2024-02-16 DIAGNOSIS — E860 Dehydration: Secondary | ICD-10-CM | POA: Diagnosis present

## 2024-02-16 DIAGNOSIS — E1122 Type 2 diabetes mellitus with diabetic chronic kidney disease: Secondary | ICD-10-CM | POA: Diagnosis present

## 2024-02-16 DIAGNOSIS — I10 Essential (primary) hypertension: Secondary | ICD-10-CM | POA: Diagnosis not present

## 2024-02-16 DIAGNOSIS — E7800 Pure hypercholesterolemia, unspecified: Secondary | ICD-10-CM | POA: Diagnosis present

## 2024-02-16 DIAGNOSIS — M069 Rheumatoid arthritis, unspecified: Secondary | ICD-10-CM | POA: Diagnosis present

## 2024-02-16 DIAGNOSIS — J449 Chronic obstructive pulmonary disease, unspecified: Secondary | ICD-10-CM | POA: Diagnosis present

## 2024-02-16 DIAGNOSIS — D631 Anemia in chronic kidney disease: Secondary | ICD-10-CM | POA: Diagnosis present

## 2024-02-16 DIAGNOSIS — I4811 Longstanding persistent atrial fibrillation: Secondary | ICD-10-CM | POA: Diagnosis present

## 2024-02-16 DIAGNOSIS — I6622 Occlusion and stenosis of left posterior cerebral artery: Secondary | ICD-10-CM | POA: Diagnosis present

## 2024-02-16 DIAGNOSIS — Z794 Long term (current) use of insulin: Secondary | ICD-10-CM | POA: Diagnosis not present

## 2024-02-16 DIAGNOSIS — R531 Weakness: Secondary | ICD-10-CM | POA: Diagnosis present

## 2024-02-16 DIAGNOSIS — I671 Cerebral aneurysm, nonruptured: Secondary | ICD-10-CM | POA: Diagnosis present

## 2024-02-16 MED ORDER — LORAZEPAM 2 MG/ML PO CONC: 1.0000 mg | ORAL | Status: DC | PRN

## 2024-02-16 MED ORDER — LORAZEPAM 2 MG/ML IJ SOLN: 1.0000 mg | INTRAMUSCULAR | Status: DC | PRN

## 2024-02-16 MED ORDER — LORAZEPAM 1 MG PO TABS: 1.0000 mg | ORAL_TABLET | ORAL | Status: DC | PRN

## 2024-02-16 MED ORDER — MORPHINE 100MG IN NS 100ML (1MG/ML) PREMIX INFUSION
1.0000 mg/h | INTRAVENOUS | Status: DC
  Administered 2024-02-16: 2 mg/h via INTRAVENOUS
  Filled 2024-02-16: qty 100

## 2024-02-18 ENCOUNTER — Ambulatory Visit: Admitting: Adult Health

## 2024-02-18 ENCOUNTER — Telehealth: Payer: Self-pay

## 2024-02-22 ENCOUNTER — Inpatient Hospital Stay: Admitting: Internal Medicine

## 2024-02-23 ENCOUNTER — Inpatient Hospital Stay

## 2024-02-23 ENCOUNTER — Inpatient Hospital Stay: Admitting: Hematology and Oncology

## 2024-02-26 NOTE — Assessment & Plan Note (Signed)
 02-15-2024 Discussed with pt's dtr [name]. She does not want pt to suffer. She states she talked to her father yesterday and they both agreed that patient did not want to suffer. Dtr states pt told her that if he could not have the same quality of life he had before his hospitalization, he would want to die. Discussed terminal care/comfort care. Discussed that meds given for pain/anxiety/suffering are not intended to accelerate dying but to treat his symptoms. Discussed that once terminal/comfort care measures were started, they could not be reversed.  Pt's dtr([name] Morris) in agreement and wishes to pursue terminal care measures.   Will start IV morphine  for SOB/air hunger. Prn IV ativan  for anxiety. Hospice house consulted for inpatient transfer to [facility].   Pt is already DNR/DNI. Will change to comfort care/terminal. RN may pronounce death.   Reviewed MRI brain with dtr that it did not show any acute strokes.  Mar 10, 2024 pt was still having terminal agitation. IV morphine  gtts ordered. Pt started on morphine  gtts for terminal agitation. Pt expired at 0955 on 03/10/2024. Dtr was notified by RN.

## 2024-02-26 NOTE — Death Summary Note (Signed)
 DEATH SUMMARY   Patient Details  Name: [name]. MRN: [id number] DOB: 11-11-1940 ERE:Rmjtqnmi, Almarie LABOR, MD Admission/Discharge Information   Admit Date:  02-18-2024  Date of Death: Date of Death: 02-23-24  Time of Death: Time of Death: 0955  Length of Stay: 0   Principle Cause of death: generalized weakness  Hospital Diagnoses: Principal Problem:   Weakness generalized Active Problems:   Terminal care   Hypothyroidism   S/P femoral-tibial bypass   Cerebral aneurysm   Essential hypertension   OSA on CPAP   Diabetes mellitus with complication, with long-term current use of insulin  (HCC)   COPD (chronic obstructive pulmonary disease) (HCC)   PAF (paroxysmal atrial fibrillation) (HCC)   Chronic respiratory failure with hypoxia (HCC) - 3-5 L/min home O2   Idiopathic thrombocytopenic purpura (ITP) (HCC)   Anemia due to chronic kidney disease   Pacemaker   Coronary artery disease involving coronary bypass graft of native heart with angina pectoris (HCC)   CKD stage 3a, GFR 45-59 ml/min (HCC)   DNR (do not resuscitate)/DNI(Do Not intubate)   Hospital Course: HPI:  [name] is a 83 y.o. 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 He had diarrhea about 3 days ago and had one day with about 4 episodes of watery diarrhea. He felt a little bit more weak after this and he increased his water intake. He then thought he over drank and took some extra diuretics. He got up yesterday and he felt a little weak and had more trouble maneuvering his walker which is new for him. He seemed to get weaker as the  day progressed. Today he couldn't really walk well and had some strange feelings behind his right ear. He had pain shooting down his left arm that felt like little shocks. He also had a headache. With the weakness he came to ED.    Baseline he is independent with all ADLs. Still cooks his own meals, showers, and drives. His daughter comes once a week and will help or run errands with him if he needs anything. He does all of his own finances.    Admitted 01/19/24 for acute on chronic respiratory failure in setting of severe COPD and recurrent moderate left pleural effusion of unclear etiology. Chest tube placed and removed 6/26. Cytology negative for malignant cells.      Denies any fever/chills, vision changes/headaches, chest pain or palpitations, shortness of breath or cough, abdominal pain, N/V/D, dysuria or leg swelling.      He does not smoke or drink alcohol .   Significant Events: Admitted February 18, 2024 for generalized weakness   Admission Labs: WBC 6.9, HgB 12.3, plt 124 Na 138, K 4.2, CO2 of 26, BUN 25, Scr 1.41 UA glu >500, neg nitrite, Neg LE BNP 313 TSH 35.78 B12 664  Admission Imaging Studies: CXR Stable chronic changes. 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  recommend consultation of a neuro interventionalist. 2. Fetal origins of both posterior cerebral arteries. 3. Calcific plaque within the carotid bulbs and origins of the internal carotid arteries bilaterally,  with less than 30% luminal stenosis. 4. Mild to moderate stenosis of the left posterior cerebral artery and the left superior cerebellar artery  Significant Labs: FT4 0.65, TSH 35  Significant Imaging Studies:   Antibiotic Therapy: Anti-infectives (From admission, onward)    None       Procedures:   Consultants: Vascular surgery Neurology Neuro-Interventional Radiology   Assessment and Plan: * Weakness  generalized Prior to 02-13-2024. 83 year old presenting to ED with vague complaints of weakness, difficulty ambulating and feeling off balance and some electrical shocks going down his arm who lives alone and is independent with all ADLs -obs to tele at [facility]  -concern for possible CVA with weakness/difficulty ambulating and some ataxia noted by EDP  -neurology consulted by EDP, Dr. Matthews.  -He is declining MRI at this time. 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 Neuro IR consulted.  -TSH, B12, magnesium   -TSH >30, was normal in May 2025. Likely not taking his medication. Free T4 pending  -PT/OT  02-13-2024 pt refused MRI. Home OT/PT recommended. Pt and family refusing home PT/OT. Stable for DC to home.  Discussed with pt's dtr [name] via phone. Discussed that pt is declining. She affirms that pt would not want home PT/OT referral. I offered to consult outpatient hospice. She does not want hospice referral right now. She will talk to pt and call hospice on her own. She states she is familiar with hospice as pt's wife was in hospice care 4 years ago before her death. Discussed discharge today. Dtr is agreeable to taking him home. Dtr is aware that pt will need portable O2 for home use. Pt refuses to go to SNF.   02-14-2024 pt changed his mind and now wants SNF placement. Also changed his mind about MRI brian. Will order brain MRI for tomorrow since he has PPM. Appears PPM is MRI compatible. Will also order IV ativan  for sedation for MRI brain. TOC aware of SNF placement request.  02-15-2024 MRI brain today. Pt wants to go to SNF. IV ativan  ordered for claustrophobia with MRI. Given his admission was 4 days ago, I doubt anything on his MRI brain is going to change his management. Pt wants to go to SNF. He refused discharge to home. Add Primidone  50 mg tid for his tremors.    Terminal care 02-15-2024 Discussed with pt's dtr [name]. She does not want pt to suffer. She states she talked to her father yesterday and they both agreed that patient did not want to suffer. Dtr states pt told her that if he could not have the same quality of life he had before his hospitalization, he would want to die. Discussed terminal care/comfort care. Discussed that meds given for pain/anxiety/suffering are not intended to accelerate dying but to treat his symptoms. Discussed that once terminal/comfort care measures were started, they could not be reversed.  Pt's dtr([name] Morris) in agreement and wishes to pursue terminal care measures.   Will start IV morphine  for SOB/air hunger. Prn IV ativan  for anxiety. Hospice house consulted for inpatient transfer to [facility].   Pt is already DNR/DNI. Will change to comfort care/terminal. RN may pronounce death.   Reviewed MRI brain with dtr that it did not show any acute strokes.  February 23, 2024 pt was still having terminal agitation. IV morphine  gtts ordered. Pt started on morphine  gtts for terminal agitation. Pt expired  at 0955 on February 21, 2024. Dtr was notified by RN.  Cerebral aneurysm Prior to 02-13-2024. 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 See their consult note.  02-15-2024 NIR not planning on any interventions.  S/P femoral-tibial bypass Prior to 02-13-2024. 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 Per vascular note he has not been symptomatic. Not weaker on left side.   New finding of absent DP doppler per EDP  EDP discussed with vascular on call and they plan on seeing him.   02-13-2024 vascular surgery consult noted. He has no worrisome findings on history or physical exam to suggest vascular catastrophe. Doubt this is the source of his weakness.   02-14-2024 stable.  02-15-2024 stable  Hypothyroidism Prior to  02-13-2024. TSH wnl in May of 2025 Repeat TSH ABNORMAL.  ? If not taking his medication???  Continue current synthroid  at 125mcg daily   02-13-2024 not sure what to make of TSH 35.78 with a normal FT4 of 0.65. he will need repeat TSH in office in 4 weeks. And maybe outpatient endocrinology consult if TSH remains abnormal. Leave on synthroid  125 mcg daily.  02-14-2024 repeat FT4 and TSH. Pt is NOT hyperthyroid. He has normal FT4 level. Not sure what to make of his elevated TSH.  If his repeat TSH is elevated, he can go see outpatient endocrinology. There is no endocrine consultation in the hospital. This is not a problem that needs to be address in the hospital.  02-15-2024 repeat TSH 23.8. continue with his same dose of synthroid . His repeat FT4 was 0.67.  no utility in repeating his TSH any sooner than in 6 weeks.  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 Baseline creatinine 1.3-1.4  02-15-2024 stable  Coronary artery disease involving coronary bypass graft of native heart with angina pectoris (HCC) 02-14-2024 Stable. Continue medical management   02-15-2024 stable  Pacemaker 02-14-2024 Stable. 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 Interrogation order placed   02-15-2024 stable  Anemia due to chronic kidney disease 02-14-2024 Baseline hgb around 12, stable   02-15-2024 stable  Idiopathic thrombocytopenic purpura (ITP) (HCC) 02-14-2024 Platelets stable. Continue SCDs VTE ppx. Continue daily prednisone  10 mg  02-15-2024 stable  Chronic respiratory failure with hypoxia (HCC) - 3-5 L/min home O2 02-14-2024 Stable on 3-4L oxygen    02-15-2024 stable on O2.  PAF (paroxysmal atrial fibrillation) (HCC) 02-14-2024 Stable No longer on AC, on ASA daily. Continue ASA and coreg    02-15-2024 stable  COPD (chronic obstructive pulmonary disease) (HCC) 02-14-2024 Stable, continue home nebs/meds. Continue yupelri , brovana  albuterol  PRN   02-15-2024  stable  Diabetes mellitus with complication, with long-term current use of insulin  (HCC) Prior to 02-13-2024. A1C in May of 2025 of 8.6  Continue long acting insulin  SSI QAC/HS  Continue jardiance    02-13-2024 continue home insulins.  02-14-2024 CBG stable.   02-15-2024 stable CBG  OSA on CPAP Continue cpap nightly   Essential hypertension Prior to 02-13-2024. Elevated, has not had medication today Continue losartan  100mg  daily, coreg  3.125mg  BID and takes lasix  20mg  daily PRN  02-13-2024 BP is stable now. Continue home meds.  02-14-2024 BP stable. Continue cozaar  and lasix .  02-15-2024 stable  DNR (do not resuscitate)/DNI(Do Not intubate) Verified DNR/DNI with pt's dtr [name].      The results of significant diagnostics from this hospitalization (including imaging, microbiology, ancillary and laboratory) are listed below for reference.  Significant Diagnostic Studies: MR BRAIN WO CONTRAST Result Date: 02/15/2024 CLINICAL DATA:  Tremor EXAM: MRI HEAD WITHOUT CONTRAST TECHNIQUE: Multiplanar, multiecho pulse sequences of the brain and surrounding structures were obtained without intravenous contrast. COMPARISON:  MRI of the head dated January 21, 2018 and CT angiogram of the head dated February 11, 2024. FINDINGS: Brain: There is age related cerebral volume loss present. There is moderate periventricular and deep cerebral white matter disease present. There is a focal area of blooming artifact present within the left parietal lobe present on image 48 of series 12, which is new since the previous MRI and as no corresponding abnormality on the recent CT angiogram the head. There are no other areas of hemosiderin staining demonstrated. Vascular: Normal vascular flow voids. Skull and upper cervical spine: Normal marrow signal. No osseous lesions. Sinuses/Orbits: Mild mucosal disease within the floors of the maxillary sinuses. The ethmoid air cells are clear. The mastoid air cells are also  clear. Status post bilateral lens replacement. Other: None. IMPRESSION: 1. Age-related atrophy and advanced cerebral white matter disease. 2. Interval development of a focal area of hemosiderin staining in the left parietal lobe, presumably secondary to an isolated area of microhemorrhage. Electronically Signed   By: Evalene Coho M.D.   On: 02/15/2024 15:40   ECHOCARDIOGRAM COMPLETE Result Date: 02/13/2024    ECHOCARDIOGRAM REPORT   Patient Name:   Isai Gottlieb. Date of Exam: 02/13/2024 Medical Rec #:  [id number]              Height:       74.0 in Accession #:    [id number]             Weight:       195.0 lb Date of Birth:  26-Dec-1940              BSA:          2.150 m Patient Age:    83 years               BP:           154/96 mmHg Patient Gender: M                      HR:           66 bpm. Exam Location:  Inpatient Procedure: 2D Echo, Cardiac Doppler and Color Doppler (Both Spectral and Color            Flow Doppler were utilized during procedure). Indications:    Dyspnea  History:        Patient has prior history of Echocardiogram examinations. CHF,                 CAD, Pacemaker, COPD, Arrythmias:Atrial Fibrillation; Risk                 Factors:Hypertension and Diabetes.  Sonographer:    Christiana Mbomeh Referring Phys: [id number] QJMYJW RASHID  Sonographer Comments: Image acquisition challenging due to patient body habitus and TDS Supine. IMPRESSIONS  1. Left ventricular ejection fraction, by estimation, is 45 to 50%. The left ventricle has mildly decreased function. The left ventricle demonstrates regional wall motion abnormalities (see scoring diagram/findings for description). There is moderate concentric left ventricular hypertrophy. Left ventricular diastolic parameters are consistent with Grade I diastolic dysfunction (impaired relaxation). There is severe hypokinesis of the left ventricular, basal-mid inferior wall, inferolateral wall and inferoseptal wall.  2. Right ventricular systolic  function is  mildly reduced. The right ventricular size is mildly enlarged. Tricuspid regurgitation signal is inadequate for assessing PA pressure.  3. Left atrial size was mildly dilated.  4. The mitral valve is normal in structure. No evidence of mitral valve regurgitation. No evidence of mitral stenosis.  5. The aortic valve is tricuspid. There is mild calcification of the aortic valve. Aortic valve regurgitation is trivial. Aortic valve sclerosis/calcification is present, without any evidence of aortic stenosis.  6. The inferior vena cava is normal in size with greater than 50% respiratory variability, suggesting right atrial pressure of 3 mmHg. Comparison(s): The left ventricular function is worsened. The left ventricular wall motion abnormality is new. FINDINGS  Left Ventricle: Left ventricular ejection fraction, by estimation, is 45 to 50%. The left ventricle has mildly decreased function. The left ventricle demonstrates regional wall motion abnormalities. Severe hypokinesis of the left ventricular, basal-mid inferior wall, inferolateral wall and inferoseptal wall. The left ventricular internal cavity size was normal in size. There is moderate concentric left ventricular hypertrophy. Abnormal (paradoxical) septal motion, consistent with RV pacemaker. Left ventricular diastolic parameters are consistent with Grade I diastolic dysfunction (impaired relaxation). Indeterminate filling pressures.  LV Wall Scoring: The inferior wall, posterior wall, mid inferoseptal segment, and basal inferoseptal segment are hypokinetic. Right Ventricle: The right ventricular size is mildly enlarged. Right vetricular wall thickness was not well visualized. Right ventricular systolic function is mildly reduced. Tricuspid regurgitation signal is inadequate for assessing PA pressure. Left Atrium: Left atrial size was mildly dilated. Right Atrium: Right atrial size was not well visualized. Pericardium: Trivial pericardial effusion is  present. Mitral Valve: The mitral valve is normal in structure. Mild mitral annular calcification. No evidence of mitral valve regurgitation. No evidence of mitral valve stenosis. Tricuspid Valve: The tricuspid valve is normal in structure. Tricuspid valve regurgitation is not demonstrated. Aortic Valve: The aortic valve is tricuspid. There is mild calcification of the aortic valve. Aortic valve regurgitation is trivial. Aortic valve sclerosis/calcification is present, without any evidence of aortic stenosis. Aortic valve mean gradient measures 2.0 mmHg. Aortic valve peak gradient measures 4.9 mmHg. Aortic valve area, by VTI measures 2.98 cm. Pulmonic Valve: The pulmonic valve was not well visualized. Pulmonic valve regurgitation is not visualized. No evidence of pulmonic stenosis. Aorta: The aortic root is normal in size and structure. Venous: The inferior vena cava is normal in size with greater than 50% respiratory variability, suggesting right atrial pressure of 3 mmHg. IAS/Shunts: The interatrial septum was not well visualized. Additional Comments: A device lead is visualized in the right ventricle and right atrium.  LEFT VENTRICLE PLAX 2D LVIDd:         4.80 cm   Diastology LVIDs:         3.96 cm   LV e' medial:    3.59 cm/s LV PW:         1.40 cm   LV E/e' medial:  12.7 LV IVS:        1.50 cm   LV e' lateral:   3.37 cm/s LVOT diam:     2.20 cm   LV E/e' lateral: 13.5 LV SV:         61 LV SV Index:   28 LVOT Area:     3.80 cm  RIGHT VENTRICLE         IVC TAPSE (M-mode): 1.8 cm  IVC diam: 1.70 cm LEFT ATRIUM             Index LA Vol (A2C):  61.1 ml 28.42 ml/m LA Vol (A4C):   49.7 ml 23.11 ml/m LA Biplane Vol: 60.3 ml 28.04 ml/m  AORTIC VALVE AV Area (Vmax):    2.91 cm AV Area (Vmean):   2.79 cm AV Area (VTI):     2.98 cm AV Vmax:           111.00 cm/s AV Vmean:          71.000 cm/s AV VTI:            0.204 m AV Peak Grad:      4.9 mmHg AV Mean Grad:      2.0 mmHg LVOT Vmax:         84.90 cm/s LVOT  Vmean:        52.200 cm/s LVOT VTI:          0.160 m LVOT/AV VTI ratio: 0.78  AORTA Ao Root diam: 3.60 cm MITRAL VALVE MV Area (PHT): 2.74 cm    SHUNTS MV Decel Time: 277 msec    Systemic VTI:  0.16 m MV E velocity: 45.60 cm/s  Systemic Diam: 2.20 cm MV A velocity: 75.50 cm/s MV E/A ratio:  0.60 Mihai Croitoru MD Electronically signed by Jerel Balding MD Signature Date/Time: 02/13/2024/1:19:29 PM    Final    CT ANGIO HEAD NECK W WO CM Addendum Date: 02/11/2024 ADDENDUM REPORT: 02/11/2024 17:04 ADDENDUM: These results were called by telephone at the time of interpretation on 02/11/2024 at 5:03 pm to provider Dr. Gala, who verbally acknowledged these results. Electronically Signed   By: Evalene Coho M.D.   On: 02/11/2024 17:04   Result Date: 02/11/2024 CLINICAL DATA:  Neuro deficit, acute, stroke suspected EXAM: CT ANGIOGRAPHY HEAD AND NECK WITH AND WITHOUT CONTRAST TECHNIQUE: Multidetector CT imaging of the head and neck was performed using the standard protocol during bolus administration of intravenous contrast. Multiplanar CT image reconstructions and MIPs were obtained to evaluate the vascular anatomy. Carotid stenosis measurements (when applicable) are obtained utilizing NASCET criteria, using the distal internal carotid diameter as the denominator. RADIATION DOSE REDUCTION: This exam was performed according to the departmental dose-optimization program which includes automated exposure control, adjustment of the mA and/or kV according to patient size and/or use of iterative reconstruction technique. CONTRAST:  75mL OMNIPAQUE  IOHEXOL  350 MG/ML SOLN COMPARISON:  MRI of the head dated January 21, 2018. FINDINGS: CT HEAD FINDINGS Brain: Age-related cerebral volume loss and mild periventricular and deep cerebral white matter disease. No evidence of hemorrhage, mass, cortical infarct or hydrocephalus. Vascular: Mild calcific atheromatous disease. Skull: Intact. Sinuses/Orbits: No acute process. Other: None.  Review of the MIP images confirms the above findings CTA NECK FINDINGS Aortic arch: Mild calcific atheromatous disease. Right carotid system: There is mild calcific and noncalcific plaque present within the distal aspect of the common carotid artery, which results in less than 20% luminal stenosis. There is mild atheromatous disease also present proximally within the internal carotid artery, with less than 10% luminal stenosis. The internal carotid artery is mildly tortuous, but otherwise normal in caliber. Left carotid system: There is calcific and noncalcific plaque within the left carotid bulb and origin of the internal carotid artery. There is mild stenosis, less than 30%. The remainder of the cervical segment of the internal carotid artery is mildly tortuous, but normal in caliber. Vertebral arteries: The right vertebral artery is dominant. There is mild calcific plaque within the V3 segment. There is no significant stenosis. The left vertebral artery is relatively hypoplastic. There is moderate stenosis  at its origin, approximately 50%. It is otherwise normal in caliber, although the V4 segment is diminutive. Skeleton: Mild dextroscoliosis of the cervical spine. Other neck: Negative. Upper chest: Emphysema. There is streaky left apical parenchymal scarring. There are calcified right apical pleural plaques and there is left apical pleural thickening. Review of the MIP images confirms the above findings CTA HEAD FINDINGS Anterior circulation: There is a 4 x 3 x 3 mm aneurysm arising superior laterally from the A2 segment of the left cerebral artery, which is seen on image 255 of series 14. The anterior cerebral arteries are otherwise normal in caliber. There is mild to moderate calcific atheromatous disease within the cranial and cavernous segments of the internal carotid arteries with no significant luminal stenosis. The middle cerebral arteries and their proximal branches are normal in caliber with no evidence  of aneurysm, large vessel occlusion or flow-limiting stenosis. Posterior circulation: There is fetal origin of the posterior cerebral arteries bilaterally. There is mild-to-moderate diffuse stenosis of the left posterior cerebral artery. There is mild diffuse irregular stenosis of the right posterior cerebral artery. The basilar artery demonstrates mild stenosis and terminates in the superior cerebellar arteries bilaterally. The left superior cerebellar artery demonstrates diffuse irregular moderate stenosis. The left V4 segment is hypoplastic. Venous sinuses: Patent. Anatomic variants: Fetal origins of both posterior cerebral arteries. Review of the MIP images confirms the above findings IMPRESSION: 1. 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 Recommend consultation of a neuro interventionalist. 2. Fetal origins of both posterior cerebral arteries. 3. Calcific plaque within the carotid bulbs and origins of the internal carotid arteries bilaterally, with less than 30% luminal stenosis. 4. Mild to moderate stenosis of the left posterior cerebral artery and the left superior cerebellar artery. Electronically Signed: By: Evalene Coho M.D. On: 02/11/2024 16:55   DG Chest Portable 1 View Result Date: 02/11/2024 CLINICAL DATA:  Generalized weakness EXAM: PORTABLE CHEST 1 VIEW COMPARISON:  01/22/2024 FINDINGS: Left pacer remains in place, unchanged. Heart mediastinal contours within normal limits. Calcified pleural plaques noted on the right. Areas of scarring throughout the right lung and in the left lung base. No definite acute process or change from prior study. IMPRESSION: Stable chronic changes.  No definite acute process. Electronically Signed   By: Franky Crease M.D.   On: 02/11/2024 14:05   DG CHEST PORT 1 VIEW Result Date: 01/22/2024 CLINICAL DATA:  Left pleural effusion EXAM: PORTABLE CHEST 1 VIEW COMPARISON:  Chest radiograph dated 01/21/2024  FINDINGS: Lines/tubes: Left chest wall pacemaker leads project over the right atrium and terminates below the field of view. Lungs: Well inflated lungs. Increased patchy left basilar opacities. Pleura: Unchanged blunting of bilateral costophrenic angles. Unchanged left apicolateral pleural thickening and right pleural calcification. Heart/mediastinum: The heart size and mediastinal contours are within normal limits. Bones: No acute osseous abnormality.  Unchanged left rib fractures. IMPRESSION: 1. Increased patchy left basilar opacities, which may represent atelectasis, aspiration, or pneumonia. 2. Unchanged blunting of bilateral costophrenic angles, which may represent pleural thickening/small pleural effusions. 3. 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 Electronically Signed   By: Limin  Xu M.D.   On: 01/22/2024 15:17   DG Chest Port 1 View Result Date: 01/21/2024 CLINICAL DATA:  Recurrent left pleural effusion EXAM: PORTABLE CHEST 1 VIEW COMPARISON:  01/20/2024 FINDINGS: Stable right-sided volume loss and right basilar and left apical parenchymal scarring. Stable right upper lung zone  pleural calcification. Tiny left pleural effusion. Stable biapical pleural thickening. No pneumothorax. 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 Cardiac size within normal limits IMPRESSION: 1. Tiny left pleural effusion. 2. Stable right-sided volume loss and right basilar and left apical parenchymal scarring. Electronically Signed   By: Dorethia Molt M.D.   On: 01/21/2024 19:42   DG Chest Port 1 View Result Date: 01/20/2024 CLINICAL DATA:  Evaluate pleural effusion. EXAM: PORTABLE CHEST 1 VIEW COMPARISON:  01/19/2024 FINDINGS: Left chest wall pacer device noted with leads in the right atrial appendage and right ventricle. A pigtail thoracostomy tube is noted overlying the left lower lobe. Small residual pleural  effusion noted with blunting of the left costophrenic angle. No significant pneumothorax noted. Unchanged pleural calcifications. Advanced changes of COPD/emphysema. Chronic pleuroparenchymal scarring with volume loss in the right lower lung. Right pleural calcifications again seen. Multiple remote left rib fractures. IMPRESSION: 1. Unchanged position of left basilar pigtail thoracostomy tube. Small residual left pleural effusion. 2. No significant pneumothorax. 3. Advanced changes of COPD/emphysema. 4. Chronic pleuroparenchymal scarring with volume loss in the right lower lung. Electronically Signed   By: Waddell Calk M.D.   On: 01/20/2024 09:03   DG Chest Portable 1 View Result Date: 01/19/2024 CLINICAL DATA:  Post chest tube EXAM: PORTABLE CHEST 1 VIEW COMPARISON:  01/19/2024 FINDINGS: Interval placement of left basilar chest tube with evacuation of the left pleural effusion. No visible significant effusion currently. No pneumothorax. Calcified pleural plaques again noted on the right. Heart and mediastinal contours are stable. Left Port-A-Cath remains in place, unchanged. Bibasilar airspace opacities, favor atelectasis. IMPRESSION: Decreasing left effusion following left basilar chest tube placement. No pneumothorax. Bibasilar opacities, favor atelectasis. Electronically Signed   By: Franky Crease M.D.   On: 01/19/2024 17:34   CT Chest W Contrast Result Date: 01/19/2024 CLINICAL DATA:  Pneumonia suspected.  * Tracking Code: BO * EXAM: CT CHEST WITH CONTRAST TECHNIQUE: Multidetector CT imaging of the chest was performed during intravenous contrast administration. RADIATION DOSE REDUCTION: This exam was performed according to the departmental dose-optimization program which includes automated exposure control, adjustment of the mA and/or kV according to patient size and/or use of iterative reconstruction technique. CONTRAST:  75mL OMNIPAQUE  IOHEXOL  300 MG/ML  SOLN COMPARISON:  Plain film of earlier today.  Most recent chest CT 12/03/2023. FINDINGS: Cardiovascular: Pacer. Aneurysm off the inferior transverse aorta is similar at 3.9 cm on 15/2. No surrounding hemorrhage. Tortuous thoracic aorta. Normal heart size, without pericardial effusion. Three vessel coronary artery calcification. No central pulmonary embolism, on this non-dedicated study. Pulmonary artery enlargement, outflow tract 3.8 cm. Mediastinum/Nodes: No mediastinal or hilar adenopathy. Lungs/Pleura: The left apical heterogeneous soft tissue density has been drained in the interval and most likely hematoma. Right-sided pleural calcified plaques. Irregular left-sided complex left pleural thickening, especially superiorly. Increased moderate volume left-sided pleural fluid inferiorly. Left-sided subpleural nodularity/adenopathy in the juxta cardiac space including at 1.9 cm on 47/2, 1.5 cm on the prior. No right-sided pleural fluid. Advanced centrilobular emphysema. Improved left apical aeration with residual scarring and volume loss. Worsened left lower lobe collapse/consolidation. Upper Abdomen: Numerous tiny gallstones. Normal imaged portions of the spleen, stomach, pancreas, adrenal glands. Upper pole left-sided caliectasis on 71/2. Aortic dilatation at the level of the left renal artery origin at 3.8 cm is similar. Musculoskeletal: Old left rib fractures. Minimally displaced posterior seventh left rib fracture. Mild compression deformity at T12 with minimal ventral canal encroachment, similar.  IMPRESSION: 1. 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 Subpleural/left cardiophrenic angle bulky adenopathy, progressive. 2. Increased left pleural fluid with left lower lobe collapse/consolidation. Atelectasis and/or infection. 3. No convincing evidence of primary malignancy within the lungs. 4. Possible upper pole left-sided caliectasis. Correlate with abdominal symptoms and  possibly dedicated imaging. 5. 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 6. Right-sided pleural calcified plaques, likely result of remote empyema or hemothorax. 7. Pulmonary artery enlargement suggests pulmonary arterial hypertension. 8. Incidental findings, including: Aortic atherosclerosis (ICD10-I70.0), coronary artery atherosclerosis and emphysema (ICD10-J43.9). Pulmonary artery enlargement suggests pulmonary arterial hypertension. Cholelithiasis. Electronically Signed   By: Rockey Kilts M.D.   On: 01/19/2024 13:44   DG Chest 2 View Result Date: 01/19/2024 CLINICAL DATA:  Shortness of breath, weight gain. EXAM: CHEST - 2 VIEW COMPARISON:  January 07, 2024. FINDINGS: Stable cardiomediastinal silhouette. Left-sided pacemaker is unchanged. Extensive right pleural calcifications are noted suggesting asbestos exposure. Small bilateral pleural effusions are noted. Bilateral lung opacities are noted concerning for infiltrates or atelectasis. Bony thorax is unremarkable. IMPRESSION: Stable right-sided pleural plaques. Mildly increased bibasilar lung opacities are noted concerning for infiltrates or atelectasis. Small pleural effusions. Electronically Signed   By: Lynwood Landy Raddle M.D.   On: 01/19/2024 09:56    Microbiology: No results found for this or any previous visit (from the past 240 hours).  Time spent: 55 minutes  Signed: Camellia Door, DO 02-29-24

## 2024-02-26 NOTE — Progress Notes (Signed)
   Notified by RN that patient has expired at 0955 on 03-14-2024  Patient was comfort care  2 RN verified.  Family was not present at time of death.  Camellia Door, DO [facility]

## 2024-02-26 DEATH — deceased

## 2024-02-29 ENCOUNTER — Telehealth: Payer: Self-pay | Admitting: Cardiology

## 2024-02-29 NOTE — Telephone Encounter (Signed)
 Daughter would like to know what to do with PPM since patient passed away. Please advise.

## 2024-02-29 NOTE — Telephone Encounter (Signed)
 Spoke to pts daughter who advised patient passed away 2024-02-27. Discussed remote monitor and was appreciative of call.

## 2024-03-09 ENCOUNTER — Ambulatory Visit: Admitting: Cardiology

## 2024-03-29 ENCOUNTER — Ambulatory Visit

## 2024-05-17 ENCOUNTER — Encounter (HOSPITAL_COMMUNITY)

## 2024-05-17 ENCOUNTER — Ambulatory Visit

## 2024-06-28 ENCOUNTER — Ambulatory Visit

## 2024-09-27 ENCOUNTER — Ambulatory Visit

## 2024-12-27 ENCOUNTER — Ambulatory Visit

## 2025-01-10 ENCOUNTER — Ambulatory Visit
